# Patient Record
Sex: Male | Born: 1980
Health system: Southern US, Community
[De-identification: ages and names within clinical notes are randomized; demographics above are authoritative.]

## PROBLEM LIST (undated history)

## (undated) ENCOUNTER — Emergency Department (HOSPITAL_COMMUNITY): Admission: EM | Discharge status: Against Medical Advice | Payer: Managed Care, Other (non HMO)

## (undated) DIAGNOSIS — IMO0002 Reserved for concepts with insufficient information to code with codable children: Secondary | ICD-10-CM

## (undated) DIAGNOSIS — Z992 Dependence on renal dialysis: Secondary | ICD-10-CM

## (undated) DIAGNOSIS — I10 Essential (primary) hypertension: Secondary | ICD-10-CM

## (undated) DIAGNOSIS — M329 Systemic lupus erythematosus, unspecified: Secondary | ICD-10-CM

## (undated) DIAGNOSIS — R569 Unspecified convulsions: Secondary | ICD-10-CM

## (undated) DIAGNOSIS — N186 End stage renal disease: Secondary | ICD-10-CM

## (undated) DIAGNOSIS — I639 Cerebral infarction, unspecified: Secondary | ICD-10-CM

## (undated) DIAGNOSIS — Z973 Presence of spectacles and contact lenses: Secondary | ICD-10-CM

## (undated) DIAGNOSIS — N189 Chronic kidney disease, unspecified: Secondary | ICD-10-CM

## (undated) HISTORY — PX: RENAL BIOPSY: SHX156

## (undated) HISTORY — DX: End stage renal disease: N18.6

---

## 2002-06-14 ENCOUNTER — Inpatient Hospital Stay (HOSPITAL_COMMUNITY): Admission: EM | Admit: 2002-06-14 | Discharge: 2002-06-25 | Payer: Self-pay | Admitting: Emergency Medicine

## 2002-06-14 ENCOUNTER — Encounter: Payer: Self-pay | Admitting: Emergency Medicine

## 2002-06-15 ENCOUNTER — Encounter: Payer: Self-pay | Admitting: Neurology

## 2002-06-16 ENCOUNTER — Encounter (INDEPENDENT_AMBULATORY_CARE_PROVIDER_SITE_OTHER): Payer: Self-pay | Admitting: Cardiology

## 2002-06-18 ENCOUNTER — Encounter: Payer: Self-pay | Admitting: Neurology

## 2004-04-25 ENCOUNTER — Emergency Department (HOSPITAL_COMMUNITY): Admission: EM | Admit: 2004-04-25 | Discharge: 2004-04-26 | Payer: Self-pay | Admitting: Emergency Medicine

## 2004-04-26 ENCOUNTER — Inpatient Hospital Stay (HOSPITAL_COMMUNITY): Admission: EM | Admit: 2004-04-26 | Discharge: 2004-05-02 | Payer: Self-pay | Admitting: Emergency Medicine

## 2004-05-05 ENCOUNTER — Ambulatory Visit (HOSPITAL_COMMUNITY): Admission: RE | Admit: 2004-05-05 | Discharge: 2004-05-05 | Payer: Self-pay | Admitting: Neurology

## 2004-05-09 ENCOUNTER — Ambulatory Visit (HOSPITAL_COMMUNITY): Admission: RE | Admit: 2004-05-09 | Discharge: 2004-05-09 | Payer: Self-pay | Admitting: Neurology

## 2004-06-23 ENCOUNTER — Ambulatory Visit: Payer: Self-pay | Admitting: Family Medicine

## 2004-07-06 ENCOUNTER — Ambulatory Visit: Payer: Self-pay | Admitting: Oncology

## 2005-03-09 ENCOUNTER — Emergency Department (HOSPITAL_COMMUNITY): Admission: EM | Admit: 2005-03-09 | Discharge: 2005-03-10 | Payer: Self-pay | Admitting: Emergency Medicine

## 2005-06-18 ENCOUNTER — Emergency Department (HOSPITAL_COMMUNITY): Admission: EM | Admit: 2005-06-18 | Discharge: 2005-06-18 | Payer: Self-pay | Admitting: Emergency Medicine

## 2005-12-23 ENCOUNTER — Emergency Department (HOSPITAL_COMMUNITY): Admission: EM | Admit: 2005-12-23 | Discharge: 2005-12-23 | Payer: Self-pay | Admitting: Family Medicine

## 2006-08-15 DIAGNOSIS — D696 Thrombocytopenia, unspecified: Secondary | ICD-10-CM | POA: Insufficient documentation

## 2006-08-15 DIAGNOSIS — F17200 Nicotine dependence, unspecified, uncomplicated: Secondary | ICD-10-CM | POA: Insufficient documentation

## 2006-08-15 DIAGNOSIS — E669 Obesity, unspecified: Secondary | ICD-10-CM | POA: Insufficient documentation

## 2006-08-15 DIAGNOSIS — I6789 Other cerebrovascular disease: Secondary | ICD-10-CM | POA: Insufficient documentation

## 2007-04-04 ENCOUNTER — Emergency Department (HOSPITAL_COMMUNITY): Admission: EM | Admit: 2007-04-04 | Discharge: 2007-04-04 | Payer: Self-pay | Admitting: Emergency Medicine

## 2007-09-05 ENCOUNTER — Emergency Department (HOSPITAL_COMMUNITY): Admission: EM | Admit: 2007-09-05 | Discharge: 2007-09-05 | Payer: Self-pay | Admitting: Emergency Medicine

## 2008-09-21 ENCOUNTER — Emergency Department (HOSPITAL_COMMUNITY): Admission: EM | Admit: 2008-09-21 | Discharge: 2008-09-21 | Payer: Self-pay | Admitting: Emergency Medicine

## 2008-10-07 ENCOUNTER — Emergency Department (HOSPITAL_COMMUNITY): Admission: EM | Admit: 2008-10-07 | Discharge: 2008-10-07 | Payer: Self-pay | Admitting: Emergency Medicine

## 2008-10-07 ENCOUNTER — Encounter (INDEPENDENT_AMBULATORY_CARE_PROVIDER_SITE_OTHER): Payer: Self-pay | Admitting: Emergency Medicine

## 2008-10-07 ENCOUNTER — Ambulatory Visit: Payer: Self-pay | Admitting: Surgery

## 2008-10-10 ENCOUNTER — Emergency Department (HOSPITAL_COMMUNITY): Admission: EM | Admit: 2008-10-10 | Discharge: 2008-10-10 | Payer: Self-pay | Admitting: Family Medicine

## 2008-12-18 ENCOUNTER — Emergency Department (HOSPITAL_COMMUNITY): Admission: EM | Admit: 2008-12-18 | Discharge: 2008-12-18 | Payer: Self-pay | Admitting: Emergency Medicine

## 2008-12-18 ENCOUNTER — Ambulatory Visit: Payer: Self-pay | Admitting: Vascular Surgery

## 2009-02-15 ENCOUNTER — Inpatient Hospital Stay (HOSPITAL_COMMUNITY): Admission: EM | Admit: 2009-02-15 | Discharge: 2009-02-25 | Payer: Self-pay | Admitting: Emergency Medicine

## 2009-02-15 ENCOUNTER — Ambulatory Visit: Payer: Self-pay | Admitting: Internal Medicine

## 2009-02-22 ENCOUNTER — Encounter (INDEPENDENT_AMBULATORY_CARE_PROVIDER_SITE_OTHER): Payer: Self-pay | Admitting: Interventional Radiology

## 2009-02-22 ENCOUNTER — Encounter (INDEPENDENT_AMBULATORY_CARE_PROVIDER_SITE_OTHER): Payer: Self-pay | Admitting: Internal Medicine

## 2009-02-22 ENCOUNTER — Ambulatory Visit: Payer: Self-pay | Admitting: Oncology

## 2009-03-17 ENCOUNTER — Encounter: Admission: RE | Admit: 2009-03-17 | Discharge: 2009-03-17 | Payer: Self-pay | Admitting: Family Medicine

## 2009-03-24 ENCOUNTER — Ambulatory Visit: Payer: Self-pay | Admitting: Oncology

## 2009-03-24 LAB — CBC & DIFF AND RETIC
Basophils Absolute: 0 10*3/uL (ref 0.0–0.1)
Eosinophils Absolute: 0 10*3/uL (ref 0.0–0.5)
HGB: 10.7 g/dL — ABNORMAL LOW (ref 13.0–17.1)
Immature Retic Fract: 2.5 % (ref 0.00–13.40)
LYMPH%: 7.1 % — ABNORMAL LOW (ref 14.0–49.0)
MCV: 88.1 fL (ref 79.3–98.0)
MONO#: 0.4 10*3/uL (ref 0.1–0.9)
MONO%: 5 % (ref 0.0–14.0)
NEUT#: 6.8 10*3/uL — ABNORMAL HIGH (ref 1.5–6.5)
Platelets: 152 10*3/uL (ref 140–400)
RDW: 15.9 % — ABNORMAL HIGH (ref 11.0–14.6)
Retic %: 0.63 % (ref 0.50–1.60)
WBC: 7.8 10*3/uL (ref 4.0–10.3)
nRBC: 0 % (ref 0–0)

## 2009-03-24 LAB — COMPREHENSIVE METABOLIC PANEL
ALT: 24 U/L (ref 0–53)
Albumin: 2.1 g/dL — ABNORMAL LOW (ref 3.5–5.2)
Alkaline Phosphatase: 89 U/L (ref 39–117)
Glucose, Bld: 104 mg/dL — ABNORMAL HIGH (ref 70–99)
Potassium: 4.1 mEq/L (ref 3.5–5.3)
Sodium: 134 mEq/L — ABNORMAL LOW (ref 135–145)
Total Bilirubin: 0.5 mg/dL (ref 0.3–1.2)
Total Protein: 6.1 g/dL (ref 6.0–8.3)

## 2009-03-24 LAB — PROTIME-INR: Protime: 13.2 Seconds (ref 10.6–13.4)

## 2009-03-24 LAB — MORPHOLOGY

## 2009-03-28 LAB — SPEP & IFE WITH QIG
Alpha-1-Globulin: 10 % — ABNORMAL HIGH (ref 2.9–4.9)
Gamma Globulin: 13.7 % (ref 11.1–18.8)
IgG (Immunoglobin G), Serum: 934 mg/dL (ref 694–1618)
IgM, Serum: 26 mg/dL — ABNORMAL LOW (ref 60–263)

## 2009-04-01 LAB — PROTIME-INR: Protime: 15.6 Seconds — ABNORMAL HIGH (ref 10.6–13.4)

## 2009-04-13 LAB — PROTIME-INR: INR: 3 (ref 2.00–3.50)

## 2009-04-20 LAB — PROTIME-INR
INR: 1.4 — ABNORMAL LOW (ref 2.00–3.50)
Protime: 16.8 Seconds — ABNORMAL HIGH (ref 10.6–13.4)

## 2009-04-25 ENCOUNTER — Ambulatory Visit: Payer: Self-pay | Admitting: Oncology

## 2009-04-27 LAB — PROTIME-INR
INR: 1.7 — ABNORMAL LOW (ref 2.00–3.50)
Protime: 20.4 Seconds — ABNORMAL HIGH (ref 10.6–13.4)

## 2009-05-11 LAB — PROTIME-INR: INR: 1.7 — ABNORMAL LOW (ref 2.00–3.50)

## 2009-05-18 LAB — PROTIME-INR: Protime: 14.4 Seconds — ABNORMAL HIGH (ref 10.6–13.4)

## 2009-05-23 ENCOUNTER — Ambulatory Visit: Payer: Self-pay | Admitting: Oncology

## 2009-05-25 LAB — PROTIME-INR: INR: 1.1 — ABNORMAL LOW (ref 2.00–3.50)

## 2009-06-03 LAB — PROTIME-INR
INR: 1.9 — ABNORMAL LOW (ref 2.00–3.50)
Protime: 22.8 Seconds — ABNORMAL HIGH (ref 10.6–13.4)

## 2009-06-16 ENCOUNTER — Ambulatory Visit: Payer: Self-pay | Admitting: Oncology

## 2009-06-22 LAB — PROTIME-INR: INR: 1.8 — ABNORMAL LOW (ref 2.00–3.50)

## 2009-06-29 LAB — PROTIME-INR
INR: 3.9 — ABNORMAL HIGH (ref 2.00–3.50)
Protime: 46.8 Seconds — ABNORMAL HIGH (ref 10.6–13.4)

## 2009-07-06 LAB — PROTIME-INR
INR: 3.1 (ref 2.00–3.50)
Protime: 37.2 Seconds — ABNORMAL HIGH (ref 10.6–13.4)

## 2009-07-13 LAB — PROTIME-INR
INR: 2 (ref 2.00–3.50)
Protime: 24 Seconds — ABNORMAL HIGH (ref 10.6–13.4)

## 2009-07-18 ENCOUNTER — Ambulatory Visit: Payer: Self-pay | Admitting: Oncology

## 2009-07-20 LAB — PROTIME-INR: Protime: 22.8 Seconds — ABNORMAL HIGH (ref 10.6–13.4)

## 2009-08-03 LAB — COMPREHENSIVE METABOLIC PANEL
Albumin: 3.7 g/dL (ref 3.5–5.2)
BUN: 27 mg/dL — ABNORMAL HIGH (ref 6–23)
Calcium: 8.6 mg/dL (ref 8.4–10.5)
Chloride: 101 mEq/L (ref 96–112)
Glucose, Bld: 80 mg/dL (ref 70–99)
Potassium: 3.9 mEq/L (ref 3.5–5.3)

## 2009-08-03 LAB — CBC WITH DIFFERENTIAL/PLATELET
Basophils Absolute: 0 10*3/uL (ref 0.0–0.1)
Eosinophils Absolute: 0.1 10*3/uL (ref 0.0–0.5)
LYMPH%: 16.5 % (ref 14.0–49.0)
MCV: 87.9 fL (ref 79.3–98.0)
MONO%: 10.1 % (ref 0.0–14.0)
NEUT#: 5 10*3/uL (ref 1.5–6.5)
Platelets: 112 10*3/uL — ABNORMAL LOW (ref 140–400)
RBC: 4.66 10*6/uL (ref 4.20–5.82)

## 2009-08-03 LAB — PROTIME-INR: Protime: 19.2 Seconds — ABNORMAL HIGH (ref 10.6–13.4)

## 2009-08-03 LAB — LACTATE DEHYDROGENASE: LDH: 220 U/L (ref 94–250)

## 2009-08-03 LAB — SEDIMENTATION RATE: Sed Rate: 15 mm/hr (ref 0–16)

## 2009-08-08 LAB — CBC WITH DIFFERENTIAL/PLATELET
BASO%: 0.1 % (ref 0.0–2.0)
LYMPH%: 10.2 % — ABNORMAL LOW (ref 14.0–49.0)
MCHC: 34 g/dL (ref 32.0–36.0)
MCV: 85.1 fL (ref 79.3–98.0)
MONO%: 6.9 % (ref 0.0–14.0)
Platelets: 140 10*3/uL (ref 140–400)
RBC: 4.91 10*6/uL (ref 4.20–5.82)

## 2009-08-08 LAB — PROTIME-INR: Protime: 21.6 Seconds — ABNORMAL HIGH (ref 10.6–13.4)

## 2009-08-12 ENCOUNTER — Ambulatory Visit: Payer: Self-pay | Admitting: Oncology

## 2009-08-17 LAB — PROTIME-INR: Protime: 54 Seconds — ABNORMAL HIGH (ref 10.6–13.4)

## 2009-08-24 LAB — PROTIME-INR
INR: 3.6 — ABNORMAL HIGH (ref 2.00–3.50)
Protime: 43.2 Seconds — ABNORMAL HIGH (ref 10.6–13.4)

## 2009-08-31 LAB — PROTIME-INR: INR: 2.3 (ref 2.00–3.50)

## 2009-09-05 ENCOUNTER — Ambulatory Visit: Payer: Self-pay | Admitting: Oncology

## 2009-09-07 LAB — PROTIME-INR

## 2009-09-14 LAB — PROTIME-INR: Protime: 25.2 Seconds — ABNORMAL HIGH (ref 10.6–13.4)

## 2009-09-28 LAB — PROTIME-INR: INR: 1.6 — ABNORMAL LOW (ref 2.00–3.50)

## 2009-10-05 ENCOUNTER — Ambulatory Visit: Payer: Self-pay | Admitting: Oncology

## 2009-10-05 LAB — PROTIME-INR: Protime: 22.8 Seconds — ABNORMAL HIGH (ref 10.6–13.4)

## 2009-10-12 LAB — PROTIME-INR: Protime: 19.2 Seconds — ABNORMAL HIGH (ref 10.6–13.4)

## 2009-10-19 LAB — PROTIME-INR
INR: 2.8 (ref 2.00–3.50)
Protime: 33.6 Seconds — ABNORMAL HIGH (ref 10.6–13.4)

## 2009-10-26 LAB — PROTIME-INR
INR: 3.2 (ref 2.00–3.50)
Protime: 38.4 Seconds — ABNORMAL HIGH (ref 10.6–13.4)

## 2009-11-02 LAB — PROTIME-INR
INR: 1.2 — ABNORMAL LOW (ref 2.00–3.50)
Protime: 14.4 Seconds — ABNORMAL HIGH (ref 10.6–13.4)

## 2009-11-08 ENCOUNTER — Ambulatory Visit: Payer: Self-pay | Admitting: Oncology

## 2009-11-09 LAB — PROTIME-INR
INR: 1.5 — ABNORMAL LOW (ref 2.00–3.50)
Protime: 18 Seconds — ABNORMAL HIGH (ref 10.6–13.4)

## 2009-11-16 LAB — PROTIME-INR: Protime: 20.4 Seconds — ABNORMAL HIGH (ref 10.6–13.4)

## 2009-11-23 LAB — PROTIME-INR
INR: 3.7 — ABNORMAL HIGH (ref 2.00–3.50)
Protime: 44.4 Seconds — ABNORMAL HIGH (ref 10.6–13.4)

## 2009-11-30 LAB — PROTIME-INR
INR: 3 (ref 2.00–3.50)
Protime: 36 Seconds — ABNORMAL HIGH (ref 10.6–13.4)

## 2009-12-07 LAB — PROTIME-INR: INR: 4.8 — ABNORMAL HIGH (ref 2.00–3.50)

## 2009-12-12 ENCOUNTER — Ambulatory Visit: Payer: Self-pay | Admitting: Oncology

## 2009-12-14 LAB — PROTIME-INR
INR: 2 (ref 2.00–3.50)
Protime: 24 Seconds — ABNORMAL HIGH (ref 10.6–13.4)

## 2009-12-21 LAB — PROTIME-INR
INR: 2.4 (ref 2.00–3.50)
Protime: 28.8 Seconds — ABNORMAL HIGH (ref 10.6–13.4)

## 2009-12-28 LAB — PROTIME-INR
INR: 2.7 (ref 2.00–3.50)
Protime: 32.4 Seconds — ABNORMAL HIGH (ref 10.6–13.4)

## 2010-01-04 LAB — PROTIME-INR
INR: 2.4 (ref 2.00–3.50)
Protime: 28.8 Seconds — ABNORMAL HIGH (ref 10.6–13.4)

## 2010-01-11 ENCOUNTER — Ambulatory Visit: Payer: Self-pay | Admitting: Oncology

## 2010-01-11 LAB — PROTIME-INR: Protime: 25.2 Seconds — ABNORMAL HIGH (ref 10.6–13.4)

## 2010-01-18 LAB — PROTIME-INR
INR: 2.2 (ref 2.00–3.50)
Protime: 26.4 Seconds — ABNORMAL HIGH (ref 10.6–13.4)

## 2010-01-25 LAB — PROTIME-INR
INR: 1.6 — ABNORMAL LOW (ref 2.00–3.50)
Protime: 19.2 Seconds — ABNORMAL HIGH (ref 10.6–13.4)

## 2010-02-01 LAB — PROTIME-INR
INR: 2.2 (ref 2.00–3.50)
Protime: 26.4 Seconds — ABNORMAL HIGH (ref 10.6–13.4)

## 2010-02-08 LAB — PROTIME-INR
INR: 2 (ref 2.00–3.50)
Protime: 24 Seconds — ABNORMAL HIGH (ref 10.6–13.4)

## 2010-02-13 ENCOUNTER — Ambulatory Visit: Payer: Self-pay | Admitting: Oncology

## 2010-02-15 LAB — PROTIME-INR
INR: 1.6 — ABNORMAL LOW (ref 2.00–3.50)
Protime: 19.2 s — ABNORMAL HIGH (ref 10.6–13.4)

## 2010-02-22 LAB — PROTIME-INR: INR: 1.7 — ABNORMAL LOW (ref 2.00–3.50)

## 2010-03-03 LAB — COMPREHENSIVE METABOLIC PANEL
ALT: 11 U/L (ref 0–53)
AST: 14 U/L (ref 0–37)
Albumin: 4.1 g/dL (ref 3.5–5.2)
Alkaline Phosphatase: 95 U/L (ref 39–117)
CO2: 27 mEq/L (ref 19–32)
Calcium: 9.1 mg/dL (ref 8.4–10.5)
Chloride: 104 mEq/L (ref 96–112)
Creatinine, Ser: 1.61 mg/dL — ABNORMAL HIGH (ref 0.40–1.50)
Glucose, Bld: 92 mg/dL (ref 70–99)
Potassium: 3.9 mEq/L (ref 3.5–5.3)
Sodium: 141 mEq/L (ref 135–145)
Total Bilirubin: 0.3 mg/dL (ref 0.3–1.2)
Total Protein: 6.7 g/dL (ref 6.0–8.3)

## 2010-03-03 LAB — CBC WITH DIFFERENTIAL/PLATELET
Basophils Absolute: 0 10*3/uL (ref 0.0–0.1)
Eosinophils Absolute: 0.1 10*3/uL (ref 0.0–0.5)
HCT: 41.5 % (ref 38.4–49.9)
HGB: 13.7 g/dL (ref 13.0–17.1)
LYMPH%: 22.4 % (ref 14.0–49.0)
MONO#: 0.6 10*3/uL (ref 0.1–0.9)
MONO%: 10.2 % (ref 0.0–14.0)
NEUT#: 4.1 10*3/uL (ref 1.5–6.5)
NEUT%: 65.3 % (ref 39.0–75.0)
Platelets: 173 10*3/uL (ref 140–400)
RBC: 4.78 10*6/uL (ref 4.20–5.82)
RDW: 14.7 % — ABNORMAL HIGH (ref 11.0–14.6)
WBC: 6.3 10*3/uL (ref 4.0–10.3)
lymph#: 1.4 10*3/uL (ref 0.9–3.3)

## 2010-03-03 LAB — PROTIME-INR
INR: 4.1 — ABNORMAL HIGH (ref 2.00–3.50)
Protime: 49.2 Seconds — ABNORMAL HIGH (ref 10.6–13.4)

## 2010-03-03 LAB — LACTATE DEHYDROGENASE: LDH: 181 U/L (ref 94–250)

## 2010-03-08 LAB — CBC WITH DIFFERENTIAL/PLATELET
BASO%: 0.2 % (ref 0.0–2.0)
EOS%: 1 % (ref 0.0–7.0)
Eosinophils Absolute: 0.1 10*3/uL (ref 0.0–0.5)
HCT: 42 % (ref 38.4–49.9)
HGB: 14.1 g/dL (ref 13.0–17.1)
LYMPH%: 7.1 % — ABNORMAL LOW (ref 14.0–49.0)
MCH: 29.2 pg (ref 27.2–33.4)
MCHC: 33.6 g/dL (ref 32.0–36.0)
MCV: 87 fL (ref 79.3–98.0)
MONO#: 0.7 10*3/uL (ref 0.1–0.9)
MONO%: 7 % (ref 0.0–14.0)
NEUT%: 84.7 % — ABNORMAL HIGH (ref 39.0–75.0)
Platelets: 153 10*3/uL (ref 140–400)
RBC: 4.83 10*6/uL (ref 4.20–5.82)
RDW: 14.2 % (ref 11.0–14.6)
WBC: 10.5 10*3/uL — ABNORMAL HIGH (ref 4.0–10.3)

## 2010-03-08 LAB — PROTIME-INR
INR: 1.8 — ABNORMAL LOW (ref 2.00–3.50)
Protime: 21.6 Seconds — ABNORMAL HIGH (ref 10.6–13.4)

## 2010-03-15 ENCOUNTER — Ambulatory Visit: Payer: Self-pay | Admitting: Oncology

## 2010-03-15 LAB — PROTIME-INR
INR: 2 (ref 2.00–3.50)
Protime: 24 Seconds — ABNORMAL HIGH (ref 10.6–13.4)

## 2010-03-29 LAB — PROTIME-INR
INR: 1.7 — ABNORMAL LOW (ref 2.00–3.50)
Protime: 20.4 Seconds — ABNORMAL HIGH (ref 10.6–13.4)

## 2010-04-12 LAB — PROTIME-INR
INR: 3 (ref 2.00–3.50)
Protime: 36 Seconds — ABNORMAL HIGH (ref 10.6–13.4)

## 2010-04-17 ENCOUNTER — Ambulatory Visit: Payer: Self-pay | Admitting: Oncology

## 2010-04-19 LAB — PROTIME-INR: INR: 3.1 (ref 2.00–3.50)

## 2010-04-26 LAB — PROTIME-INR
INR: 2.1 (ref 2.00–3.50)
Protime: 25.2 Seconds — ABNORMAL HIGH (ref 10.6–13.4)

## 2010-05-17 ENCOUNTER — Ambulatory Visit: Payer: Self-pay | Admitting: Oncology

## 2010-05-17 LAB — PROTIME-INR: Protime: 20.4 Seconds — ABNORMAL HIGH (ref 10.6–13.4)

## 2010-05-24 LAB — PROTIME-INR: Protime: 30 Seconds — ABNORMAL HIGH (ref 10.6–13.4)

## 2010-06-09 ENCOUNTER — Emergency Department (HOSPITAL_COMMUNITY)
Admission: EM | Admit: 2010-06-09 | Discharge: 2010-06-09 | Payer: Self-pay | Source: Home / Self Care | Admitting: Emergency Medicine

## 2010-06-16 ENCOUNTER — Emergency Department (HOSPITAL_COMMUNITY)
Admission: EM | Admit: 2010-06-16 | Discharge: 2010-06-16 | Payer: Self-pay | Source: Home / Self Care | Admitting: Emergency Medicine

## 2010-06-21 ENCOUNTER — Ambulatory Visit: Payer: Self-pay | Admitting: Oncology

## 2010-06-21 LAB — PROTIME-INR
INR: 1.3 — ABNORMAL LOW (ref 2.00–3.50)
Protime: 15.6 Seconds — ABNORMAL HIGH (ref 10.6–13.4)

## 2010-07-05 LAB — PROTHROMBIN TIME
INR: 5.45 (ref <1.50)
Prothrombin Time: 49.4 seconds — ABNORMAL HIGH (ref 11.6–15.2)

## 2010-07-05 LAB — PROTIME-INR

## 2010-07-12 LAB — PROTIME-INR
INR: 2.7 (ref 2.00–3.50)
Protime: 32.4 Seconds — ABNORMAL HIGH (ref 10.6–13.4)

## 2010-07-26 ENCOUNTER — Other Ambulatory Visit: Payer: Self-pay | Admitting: Oncology

## 2010-07-26 ENCOUNTER — Encounter (HOSPITAL_BASED_OUTPATIENT_CLINIC_OR_DEPARTMENT_OTHER): Payer: BC Managed Care – PPO | Admitting: Oncology

## 2010-07-26 DIAGNOSIS — Z7901 Long term (current) use of anticoagulants: Secondary | ICD-10-CM

## 2010-07-26 DIAGNOSIS — D696 Thrombocytopenia, unspecified: Secondary | ICD-10-CM

## 2010-07-26 DIAGNOSIS — Z8673 Personal history of transient ischemic attack (TIA), and cerebral infarction without residual deficits: Secondary | ICD-10-CM

## 2010-07-26 LAB — PROTIME-INR

## 2010-07-26 LAB — PROTHROMBIN TIME: INR: 7.43 (ref <1.50)

## 2010-07-31 ENCOUNTER — Other Ambulatory Visit: Payer: Self-pay | Admitting: Oncology

## 2010-07-31 ENCOUNTER — Encounter (HOSPITAL_BASED_OUTPATIENT_CLINIC_OR_DEPARTMENT_OTHER): Payer: BC Managed Care – PPO | Admitting: Oncology

## 2010-07-31 DIAGNOSIS — Z8673 Personal history of transient ischemic attack (TIA), and cerebral infarction without residual deficits: Secondary | ICD-10-CM

## 2010-07-31 DIAGNOSIS — Z7901 Long term (current) use of anticoagulants: Secondary | ICD-10-CM

## 2010-07-31 DIAGNOSIS — D696 Thrombocytopenia, unspecified: Secondary | ICD-10-CM

## 2010-07-31 LAB — CBC WITH DIFFERENTIAL/PLATELET
BASO%: 0.4 % (ref 0.0–2.0)
EOS%: 2 % (ref 0.0–7.0)
Eosinophils Absolute: 0.1 10*3/uL (ref 0.0–0.5)
HGB: 14.8 g/dL (ref 13.0–17.1)
LYMPH%: 19.3 % (ref 14.0–49.0)
MCH: 28.8 pg (ref 27.2–33.4)
MCHC: 33.3 g/dL (ref 32.0–36.0)
MCV: 86.6 fL (ref 79.3–98.0)
MONO#: 0.7 10*3/uL (ref 0.1–0.9)
MONO%: 14.2 % — ABNORMAL HIGH (ref 0.0–14.0)
NEUT%: 64.1 % (ref 39.0–75.0)
Platelets: 163 10*3/uL (ref 140–400)
RBC: 5.14 10*6/uL (ref 4.20–5.82)
WBC: 4.9 10*3/uL (ref 4.0–10.3)
lymph#: 1 10*3/uL (ref 0.9–3.3)
nRBC: 0 % (ref 0–0)

## 2010-07-31 LAB — PROTIME-INR: INR: 2.1 (ref 2.00–3.50)

## 2010-08-09 ENCOUNTER — Encounter (HOSPITAL_BASED_OUTPATIENT_CLINIC_OR_DEPARTMENT_OTHER): Payer: BC Managed Care – PPO | Admitting: Oncology

## 2010-08-09 ENCOUNTER — Other Ambulatory Visit: Payer: Self-pay | Admitting: Oncology

## 2010-08-09 DIAGNOSIS — Z8673 Personal history of transient ischemic attack (TIA), and cerebral infarction without residual deficits: Secondary | ICD-10-CM

## 2010-08-09 DIAGNOSIS — D696 Thrombocytopenia, unspecified: Secondary | ICD-10-CM

## 2010-08-09 DIAGNOSIS — Z7901 Long term (current) use of anticoagulants: Secondary | ICD-10-CM

## 2010-08-09 LAB — PROTIME-INR: Protime: 19.2 Seconds — ABNORMAL HIGH (ref 10.6–13.4)

## 2010-08-16 ENCOUNTER — Encounter (HOSPITAL_BASED_OUTPATIENT_CLINIC_OR_DEPARTMENT_OTHER): Payer: BC Managed Care – PPO | Admitting: Oncology

## 2010-08-16 ENCOUNTER — Other Ambulatory Visit: Payer: Self-pay | Admitting: Oncology

## 2010-08-16 DIAGNOSIS — Z8673 Personal history of transient ischemic attack (TIA), and cerebral infarction without residual deficits: Secondary | ICD-10-CM

## 2010-08-16 DIAGNOSIS — D696 Thrombocytopenia, unspecified: Secondary | ICD-10-CM

## 2010-08-16 DIAGNOSIS — Z7901 Long term (current) use of anticoagulants: Secondary | ICD-10-CM

## 2010-08-16 LAB — PROTIME-INR: INR: 1.7 — ABNORMAL LOW (ref 2.00–3.50)

## 2010-08-28 LAB — DIFFERENTIAL
Basophils Absolute: 0 10*3/uL (ref 0.0–0.1)
Eosinophils Absolute: 0.2 10*3/uL (ref 0.0–0.7)
Eosinophils Relative: 2 % (ref 0–5)
Eosinophils Relative: 2 % (ref 0–5)
Lymphocytes Relative: 23 % (ref 12–46)
Lymphs Abs: 1.5 10*3/uL (ref 0.7–4.0)
Monocytes Absolute: 0.6 10*3/uL (ref 0.1–1.0)
Monocytes Absolute: 0.6 10*3/uL (ref 0.1–1.0)
Monocytes Relative: 10 % (ref 3–12)

## 2010-08-28 LAB — COMPREHENSIVE METABOLIC PANEL
ALT: 19 U/L (ref 0–53)
AST: 29 U/L (ref 0–37)
Albumin: 3.9 g/dL (ref 3.5–5.2)
CO2: 25 mEq/L (ref 19–32)
Chloride: 101 mEq/L (ref 96–112)
GFR calc Af Amer: 33 mL/min — ABNORMAL LOW (ref >60)
GFR calc non Af Amer: 27 mL/min — ABNORMAL LOW (ref >60)
Potassium: 3.2 mEq/L — ABNORMAL LOW (ref 3.5–5.1)
Sodium: 139 mEq/L (ref 135–145)
Total Bilirubin: 0.5 mg/dL (ref 0.3–1.2)

## 2010-08-28 LAB — CBC
HCT: 47.2 % (ref 39.0–52.0)
Hemoglobin: 15.9 g/dL (ref 13.0–17.0)
Hemoglobin: 16.2 g/dL (ref 13.0–17.0)
MCH: 29.6 pg (ref 26.0–34.0)
MCV: 87.9 fL (ref 78.0–100.0)
RBC: 5.37 MIL/uL (ref 4.22–5.81)
RBC: 5.53 MIL/uL (ref 4.22–5.81)
WBC: 10.3 10*3/uL (ref 4.0–10.5)
WBC: 6.2 10*3/uL (ref 4.0–10.5)

## 2010-08-28 LAB — URINALYSIS, ROUTINE W REFLEX MICROSCOPIC
Bilirubin Urine: NEGATIVE
Glucose, UA: NEGATIVE mg/dL
Ketones, ur: NEGATIVE mg/dL
Nitrite: NEGATIVE
Protein, ur: 30 mg/dL — AB
pH: 5.5 (ref 5.0–8.0)

## 2010-08-28 LAB — PROTIME-INR
INR: 3 — ABNORMAL HIGH (ref 0.00–1.49)
Prothrombin Time: 57.1 seconds — ABNORMAL HIGH (ref 11.6–15.2)

## 2010-08-28 LAB — POCT CARDIAC MARKERS
CKMB, poc: 1.6 ng/mL (ref 1.0–8.0)
Myoglobin, poc: >500 ng/mL (ref 12–200)
Troponin i, poc: <0.05 ng/mL (ref 0.00–0.09)

## 2010-08-28 LAB — URINE CULTURE

## 2010-08-28 LAB — BASIC METABOLIC PANEL
CO2: 28 mEq/L (ref 19–32)
Chloride: 105 mEq/L (ref 96–112)
GFR calc Af Amer: 49 mL/min — ABNORMAL LOW (ref >60)
Potassium: 4.4 mEq/L (ref 3.5–5.1)

## 2010-08-28 LAB — PHENYTOIN LEVEL, TOTAL: Phenytoin Lvl: <2.5 ug/mL — ABNORMAL LOW (ref 10.0–20.0)

## 2010-08-28 LAB — URINE MICROSCOPIC-ADD ON

## 2010-09-13 ENCOUNTER — Other Ambulatory Visit: Payer: Self-pay | Admitting: Oncology

## 2010-09-13 ENCOUNTER — Encounter (HOSPITAL_BASED_OUTPATIENT_CLINIC_OR_DEPARTMENT_OTHER): Payer: BC Managed Care – PPO | Admitting: Oncology

## 2010-09-13 DIAGNOSIS — D696 Thrombocytopenia, unspecified: Secondary | ICD-10-CM

## 2010-09-13 DIAGNOSIS — Z7901 Long term (current) use of anticoagulants: Secondary | ICD-10-CM

## 2010-09-13 DIAGNOSIS — Z8673 Personal history of transient ischemic attack (TIA), and cerebral infarction without residual deficits: Secondary | ICD-10-CM

## 2010-09-13 LAB — PROTIME-INR: INR: 2 (ref 2.00–3.50)

## 2010-09-22 LAB — BASIC METABOLIC PANEL
BUN: 31 mg/dL — ABNORMAL HIGH (ref 6–23)
BUN: 41 mg/dL — ABNORMAL HIGH (ref 6–23)
CO2: 24 mEq/L (ref 19–32)
CO2: 26 mEq/L (ref 19–32)
CO2: 28 mEq/L (ref 19–32)
Calcium: 8.2 mg/dL — ABNORMAL LOW (ref 8.4–10.5)
Chloride: 107 mEq/L (ref 96–112)
Chloride: 109 mEq/L (ref 96–112)
Creatinine, Ser: 2.47 mg/dL — ABNORMAL HIGH (ref 0.4–1.5)
Creatinine, Ser: 2.58 mg/dL — ABNORMAL HIGH (ref 0.4–1.5)
GFR calc Af Amer: 38 mL/min — ABNORMAL LOW (ref >60)
Glucose, Bld: 126 mg/dL — ABNORMAL HIGH (ref 70–99)
Glucose, Bld: 136 mg/dL — ABNORMAL HIGH (ref 70–99)
Potassium: 4.9 mEq/L (ref 3.5–5.1)
Sodium: 139 mEq/L (ref 135–145)

## 2010-09-22 LAB — RENAL FUNCTION PANEL
Albumin: 2 g/dL — ABNORMAL LOW (ref 3.5–5.2)
Albumin: 2.1 g/dL — ABNORMAL LOW (ref 3.5–5.2)
BUN: 21 mg/dL (ref 6–23)
BUN: 32 mg/dL — ABNORMAL HIGH (ref 6–23)
BUN: 37 mg/dL — ABNORMAL HIGH (ref 6–23)
CO2: 27 mEq/L (ref 19–32)
CO2: 27 mEq/L (ref 19–32)
CO2: 29 mEq/L (ref 19–32)
CO2: 29 mEq/L (ref 19–32)
Calcium: 7.5 mg/dL — ABNORMAL LOW (ref 8.4–10.5)
Calcium: 7.5 mg/dL — ABNORMAL LOW (ref 8.4–10.5)
Calcium: 7.8 mg/dL — ABNORMAL LOW (ref 8.4–10.5)
Calcium: 8 mg/dL — ABNORMAL LOW (ref 8.4–10.5)
Calcium: 8.2 mg/dL — ABNORMAL LOW (ref 8.4–10.5)
Chloride: 108 mEq/L (ref 96–112)
Creatinine, Ser: 2.05 mg/dL — ABNORMAL HIGH (ref 0.4–1.5)
Creatinine, Ser: 2.27 mg/dL — ABNORMAL HIGH (ref 0.4–1.5)
Creatinine, Ser: 2.34 mg/dL — ABNORMAL HIGH (ref 0.4–1.5)
Creatinine, Ser: 2.47 mg/dL — ABNORMAL HIGH (ref 0.4–1.5)
Creatinine, Ser: 2.49 mg/dL — ABNORMAL HIGH (ref 0.4–1.5)
Creatinine, Ser: 2.68 mg/dL — ABNORMAL HIGH (ref 0.4–1.5)
GFR calc Af Amer: 31 mL/min — ABNORMAL LOW (ref >60)
GFR calc Af Amer: 34 mL/min — ABNORMAL LOW (ref >60)
GFR calc Af Amer: 42 mL/min — ABNORMAL LOW (ref >60)
GFR calc Af Amer: 47 mL/min — ABNORMAL LOW (ref >60)
GFR calc non Af Amer: 25 mL/min — ABNORMAL LOW (ref >60)
GFR calc non Af Amer: 28 mL/min — ABNORMAL LOW (ref >60)
GFR calc non Af Amer: 34 mL/min — ABNORMAL LOW (ref >60)
GFR calc non Af Amer: 39 mL/min — ABNORMAL LOW (ref >60)
Glucose, Bld: 102 mg/dL — ABNORMAL HIGH (ref 70–99)
Glucose, Bld: 112 mg/dL — ABNORMAL HIGH (ref 70–99)
Glucose, Bld: 125 mg/dL — ABNORMAL HIGH (ref 70–99)
Glucose, Bld: 136 mg/dL — ABNORMAL HIGH (ref 70–99)
Phosphorus: 2.7 mg/dL (ref 2.3–4.6)
Phosphorus: 3.9 mg/dL (ref 2.3–4.6)
Phosphorus: 4 mg/dL (ref 2.3–4.6)
Phosphorus: 4.9 mg/dL — ABNORMAL HIGH (ref 2.3–4.6)
Potassium: 4.2 mEq/L (ref 3.5–5.1)
Potassium: 4.8 mEq/L (ref 3.5–5.1)
Sodium: 141 mEq/L (ref 135–145)
Sodium: 142 mEq/L (ref 135–145)

## 2010-09-22 LAB — CBC
HCT: 31.3 % — ABNORMAL LOW (ref 39.0–52.0)
Hemoglobin: 10.3 g/dL — ABNORMAL LOW (ref 13.0–17.0)
Hemoglobin: 10.4 g/dL — ABNORMAL LOW (ref 13.0–17.0)
Hemoglobin: 10.6 g/dL — ABNORMAL LOW (ref 13.0–17.0)
Hemoglobin: 10.6 g/dL — ABNORMAL LOW (ref 13.0–17.0)
Hemoglobin: 10.6 g/dL — ABNORMAL LOW (ref 13.0–17.0)
Hemoglobin: 11.3 g/dL — ABNORMAL LOW (ref 13.0–17.0)
MCHC: 33.1 g/dL (ref 30.0–36.0)
MCHC: 33.2 g/dL (ref 30.0–36.0)
MCHC: 33.8 g/dL (ref 30.0–36.0)
MCHC: 33.8 g/dL (ref 30.0–36.0)
MCHC: 33.9 g/dL (ref 30.0–36.0)
MCHC: 34.2 g/dL (ref 30.0–36.0)
MCHC: 34.3 g/dL (ref 30.0–36.0)
MCV: 83.7 fL (ref 78.0–100.0)
MCV: 84.8 fL (ref 78.0–100.0)
MCV: 85.1 fL (ref 78.0–100.0)
MCV: 85.1 fL (ref 78.0–100.0)
MCV: 86.1 fL (ref 78.0–100.0)
Platelets: 49 10*3/uL — CL (ref 150–400)
Platelets: 51 10*3/uL — ABNORMAL LOW (ref 150–400)
Platelets: 53 10*3/uL — ABNORMAL LOW (ref 150–400)
Platelets: 59 10*3/uL — ABNORMAL LOW (ref 150–400)
Platelets: 64 10*3/uL — ABNORMAL LOW (ref 150–400)
RBC: 3.2 MIL/uL — ABNORMAL LOW (ref 4.22–5.81)
RBC: 3.31 MIL/uL — ABNORMAL LOW (ref 4.22–5.81)
RBC: 3.73 MIL/uL — ABNORMAL LOW (ref 4.22–5.81)
RBC: 3.76 MIL/uL — ABNORMAL LOW (ref 4.22–5.81)
RBC: 3.78 MIL/uL — ABNORMAL LOW (ref 4.22–5.81)
RBC: 4.02 MIL/uL — ABNORMAL LOW (ref 4.22–5.81)
RDW: 13.5 % (ref 11.5–15.5)
RDW: 13.7 % (ref 11.5–15.5)
RDW: 13.9 % (ref 11.5–15.5)
RDW: 14 % (ref 11.5–15.5)
RDW: 14.1 % (ref 11.5–15.5)
RDW: 14.3 % (ref 11.5–15.5)
WBC: 11.2 10*3/uL — ABNORMAL HIGH (ref 4.0–10.5)
WBC: 13.9 10*3/uL — ABNORMAL HIGH (ref 4.0–10.5)

## 2010-09-22 LAB — PREPARE PLATELETS

## 2010-09-22 LAB — COMPREHENSIVE METABOLIC PANEL
ALT: 35 U/L (ref 0–53)
Albumin: 2.3 g/dL — ABNORMAL LOW (ref 3.5–5.2)
Alkaline Phosphatase: 64 U/L (ref 39–117)
BUN: 17 mg/dL (ref 6–23)
BUN: 21 mg/dL (ref 6–23)
CO2: 26 mEq/L (ref 19–32)
CO2: 27 mEq/L (ref 19–32)
Calcium: 8 mg/dL — ABNORMAL LOW (ref 8.4–10.5)
Calcium: 8.1 mg/dL — ABNORMAL LOW (ref 8.4–10.5)
Chloride: 107 mEq/L (ref 96–112)
Chloride: 107 mEq/L (ref 96–112)
Creatinine, Ser: 1.81 mg/dL — ABNORMAL HIGH (ref 0.4–1.5)
Creatinine, Ser: 2.05 mg/dL — ABNORMAL HIGH (ref 0.4–1.5)
GFR calc Af Amer: 47 mL/min — ABNORMAL LOW (ref >60)
GFR calc non Af Amer: 39 mL/min — ABNORMAL LOW (ref >60)
GFR calc non Af Amer: 45 mL/min — ABNORMAL LOW (ref >60)
Glucose, Bld: 100 mg/dL — ABNORMAL HIGH (ref 70–99)
Glucose, Bld: 91 mg/dL (ref 70–99)
Potassium: 4.7 mEq/L (ref 3.5–5.1)
Sodium: 139 mEq/L (ref 135–145)
Total Bilirubin: 0.6 mg/dL (ref 0.3–1.2)
Total Protein: 5.5 g/dL — ABNORMAL LOW (ref 6.0–8.3)

## 2010-09-22 LAB — PLATELET FUNCTION ASSAY
Collagen / ADP: 221 seconds (ref 0–114)
Collagen / ADP: >300 seconds (ref 0–114)
Collagen / Epinephrine: 229 seconds (ref 0–184)
Collagen / Epinephrine: >300 seconds (ref 0–184)

## 2010-09-22 LAB — LUPUS ANTICOAGULANT PANEL
Lupus Anticoagulant: DETECTED — AB
PTTLA 4:1 Mix: 74.6 secs — ABNORMAL HIGH (ref 36.3–48.8)
PTTLA Confirmation: 34.8 secs — ABNORMAL HIGH (ref <8.0)

## 2010-09-22 LAB — APTT: aPTT: 45 seconds — ABNORMAL HIGH (ref 24–37)

## 2010-09-22 LAB — TYPE AND SCREEN: ABO/RH(D): O POS

## 2010-09-22 LAB — IRON AND TIBC
Iron: 52 ug/dL (ref 42–135)
TIBC: 152 ug/dL — ABNORMAL LOW (ref 215–435)
UIBC: 151 ug/dL

## 2010-09-22 LAB — CARDIOLIPIN ANTIBODIES, IGG, IGM, IGA
Anticardiolipin IgA: <10 APL U/mL — ABNORMAL LOW (ref <10)
Anticardiolipin IgG: <10 GPL U/mL — ABNORMAL LOW (ref <10)

## 2010-09-22 LAB — DIFFERENTIAL
Eosinophils Absolute: 0.2 10*3/uL (ref 0.0–0.7)
Lymphocytes Relative: 6 % — ABNORMAL LOW (ref 12–46)
Lymphs Abs: 0.6 10*3/uL — ABNORMAL LOW (ref 0.7–4.0)
Neutrophils Relative %: 87 % — ABNORMAL HIGH (ref 43–77)

## 2010-09-22 LAB — BETA-2-GLYCOPROTEIN I ABS, IGG/M/A
Beta-2 Glyco I IgG: <9 SGU (ref <=20)
Beta-2-Glycoprotein I IgA: <9 SAU (ref <=20)
Beta-2-Glycoprotein I IgM: <9 SMU (ref <=20)

## 2010-09-22 LAB — HAPTOGLOBIN: Haptoglobin: 191 mg/dL (ref 16–200)

## 2010-09-22 LAB — PHENYTOIN LEVEL, TOTAL
Phenytoin Lvl: 8.7 ug/mL — ABNORMAL LOW (ref 10.0–20.0)
Phenytoin Lvl: 9.8 ug/mL — ABNORMAL LOW (ref 10.0–20.0)

## 2010-09-22 LAB — PROTEIN S ACTIVITY: Protein S Activity: 51 % — ABNORMAL LOW (ref 69–129)

## 2010-09-22 LAB — PROTIME-INR: INR: 1 (ref 0.00–1.49)

## 2010-09-22 LAB — DIC (DISSEMINATED INTRAVASCULAR COAGULATION)PANEL
D-Dimer, Quant: 8.05 ug/mL-FEU — ABNORMAL HIGH (ref 0.00–0.48)
Prothrombin Time: 13.2 seconds (ref 11.6–15.2)

## 2010-09-22 LAB — VITAMIN B12: Vitamin B-12: 590 pg/mL (ref 211–911)

## 2010-09-22 LAB — LACTATE DEHYDROGENASE: LDH: 205 U/L (ref 94–250)

## 2010-09-22 LAB — HOMOCYSTEINE: Homocysteine: 20.1 umol/L — ABNORMAL HIGH (ref 4.0–15.4)

## 2010-09-22 LAB — FACTOR 5 LEIDEN

## 2010-09-22 LAB — PROTEIN S, TOTAL: Protein S Ag, Total: 86 % (ref 70–140)

## 2010-09-22 LAB — ABO/RH: ABO/RH(D): O POS

## 2010-09-22 LAB — TSH: TSH: 0.711 u[IU]/mL (ref 0.350–4.500)

## 2010-09-23 LAB — URINE CULTURE: Colony Count: NO GROWTH

## 2010-09-23 LAB — BASIC METABOLIC PANEL
CO2: 26 mEq/L (ref 19–32)
Calcium: 8 mg/dL — ABNORMAL LOW (ref 8.4–10.5)
Glucose, Bld: 85 mg/dL (ref 70–99)
Potassium: 4 mEq/L (ref 3.5–5.1)
Sodium: 139 mEq/L (ref 135–145)

## 2010-09-23 LAB — DIFFERENTIAL
Basophils Absolute: 0 10*3/uL (ref 0.0–0.1)
Basophils Absolute: 0 10*3/uL (ref 0.0–0.1)
Basophils Relative: 0 % (ref 0–1)
Basophils Relative: 0 % (ref 0–1)
Eosinophils Absolute: 0 10*3/uL (ref 0.0–0.7)
Eosinophils Relative: 0 % (ref 0–5)
Eosinophils Relative: 0 % (ref 0–5)
Lymphocytes Relative: 4 % — ABNORMAL LOW (ref 12–46)
Monocytes Absolute: 0.9 10*3/uL (ref 0.1–1.0)

## 2010-09-23 LAB — CBC
HCT: 29 % — ABNORMAL LOW (ref 39.0–52.0)
HCT: 34.4 % — ABNORMAL LOW (ref 39.0–52.0)
Hemoglobin: 9.9 g/dL — ABNORMAL LOW (ref 13.0–17.0)
MCHC: 33.4 g/dL (ref 30.0–36.0)
MCHC: 33.9 g/dL (ref 30.0–36.0)
Platelets: 61 10*3/uL — ABNORMAL LOW (ref 150–400)
RDW: 13.9 % (ref 11.5–15.5)
RDW: 13.9 % (ref 11.5–15.5)

## 2010-09-23 LAB — BLOOD GAS, ARTERIAL
Acid-base deficit: 2.8 mmol/L — ABNORMAL HIGH (ref 0.0–2.0)
Bicarbonate: 21.7 mEq/L (ref 20.0–24.0)
O2 Saturation: 74 %
pO2, Arterial: 42.8 mmHg — ABNORMAL LOW (ref 80.0–100.0)

## 2010-09-23 LAB — RAPID URINE DRUG SCREEN, HOSP PERFORMED
Opiates: NOT DETECTED
Tetrahydrocannabinol: NOT DETECTED

## 2010-09-23 LAB — POCT I-STAT, CHEM 8
Calcium, Ion: 1.17 mmol/L (ref 1.12–1.32)
Chloride: 109 mEq/L (ref 96–112)
Glucose, Bld: 81 mg/dL (ref 70–99)
HCT: 38 % — ABNORMAL LOW (ref 39.0–52.0)

## 2010-09-23 LAB — URINALYSIS, ROUTINE W REFLEX MICROSCOPIC
Glucose, UA: NEGATIVE mg/dL
Protein, ur: >300 mg/dL — AB

## 2010-09-23 LAB — BRAIN NATRIURETIC PEPTIDE: Pro B Natriuretic peptide (BNP): 68.7 pg/mL (ref 0.0–100.0)

## 2010-09-23 LAB — URINE MICROSCOPIC-ADD ON

## 2010-09-23 LAB — POCT CARDIAC MARKERS
CKMB, poc: 2.2 ng/mL (ref 1.0–8.0)
Troponin i, poc: <0.05 ng/mL (ref 0.00–0.09)

## 2010-09-24 LAB — GLUCOSE, CAPILLARY: Glucose-Capillary: 80 mg/dL (ref 70–99)

## 2010-09-27 ENCOUNTER — Other Ambulatory Visit: Payer: Self-pay | Admitting: Oncology

## 2010-09-27 ENCOUNTER — Encounter (HOSPITAL_BASED_OUTPATIENT_CLINIC_OR_DEPARTMENT_OTHER): Payer: BC Managed Care – PPO | Admitting: Oncology

## 2010-09-27 DIAGNOSIS — Z7901 Long term (current) use of anticoagulants: Secondary | ICD-10-CM

## 2010-09-27 DIAGNOSIS — Z8673 Personal history of transient ischemic attack (TIA), and cerebral infarction without residual deficits: Secondary | ICD-10-CM

## 2010-09-27 DIAGNOSIS — D696 Thrombocytopenia, unspecified: Secondary | ICD-10-CM

## 2010-09-27 LAB — BASIC METABOLIC PANEL
BUN: 16 mg/dL (ref 6–23)
BUN: 18 mg/dL (ref 6–23)
CO2: 27 mEq/L (ref 19–32)
CO2: 26 mEq/L (ref 19–32)
Calcium: 8.9 mg/dL (ref 8.4–10.5)
Chloride: 107 mEq/L (ref 96–112)
Chloride: 105 mEq/L (ref 96–112)
Creatinine, Ser: 1.64 mg/dL — ABNORMAL HIGH (ref 0.4–1.5)
Creatinine, Ser: 1.61 mg/dL — ABNORMAL HIGH (ref 0.4–1.5)
GFR calc Af Amer: >60 mL/min (ref >60)
Glucose, Bld: 90 mg/dL (ref 70–99)

## 2010-09-27 LAB — PROTIME-INR
INR: 1 (ref 0.00–1.49)
Prothrombin Time: 13.6 seconds (ref 11.6–15.2)
Prothrombin Time: 13.3 seconds (ref 11.6–15.2)

## 2010-09-27 LAB — RAPID URINE DRUG SCREEN, HOSP PERFORMED
Benzodiazepines: NOT DETECTED
Cocaine: NOT DETECTED
Opiates: NOT DETECTED

## 2010-09-27 LAB — DIFFERENTIAL
Basophils Absolute: 0 10*3/uL (ref 0.0–0.1)
Basophils Relative: 0 % (ref 0–1)
Basophils Relative: 0 % (ref 0–1)
Eosinophils Absolute: 0.2 10*3/uL (ref 0.0–0.7)
Eosinophils Absolute: 0.2 10*3/uL (ref 0.0–0.7)
Eosinophils Relative: 2 % (ref 0–5)
Lymphocytes Relative: 9 % — ABNORMAL LOW (ref 12–46)
Monocytes Absolute: 0.4 10*3/uL (ref 0.1–1.0)
Neutrophils Relative %: 78 % — ABNORMAL HIGH (ref 43–77)
Neutrophils Relative %: 84 % — ABNORMAL HIGH (ref 43–77)

## 2010-09-27 LAB — URINALYSIS, ROUTINE W REFLEX MICROSCOPIC
Bilirubin Urine: NEGATIVE
Glucose, UA: NEGATIVE mg/dL
Protein, ur: >300 mg/dL — AB
Urobilinogen, UA: 0.2 mg/dL (ref 0.0–1.0)

## 2010-09-27 LAB — URINE MICROSCOPIC-ADD ON

## 2010-09-27 LAB — CBC
MCHC: 33.8 g/dL (ref 30.0–36.0)
MCHC: 33.4 g/dL (ref 30.0–36.0)
MCV: 87.8 fL (ref 78.0–100.0)
MCV: 88 fL (ref 78.0–100.0)
Platelets: 53 10*3/uL — ABNORMAL LOW (ref 150–400)
Platelets: 36 10*3/uL — CL (ref 150–400)
RBC: 5.57 MIL/uL (ref 4.22–5.81)
RDW: 12.6 % (ref 11.5–15.5)
RDW: 12.7 % (ref 11.5–15.5)

## 2010-09-27 LAB — APTT: aPTT: 48 seconds — ABNORMAL HIGH (ref 24–37)

## 2010-09-27 LAB — GLUCOSE, CAPILLARY: Glucose-Capillary: 101 mg/dL — ABNORMAL HIGH (ref 70–99)

## 2010-11-01 ENCOUNTER — Other Ambulatory Visit: Payer: Self-pay | Admitting: Oncology

## 2010-11-01 ENCOUNTER — Encounter (HOSPITAL_BASED_OUTPATIENT_CLINIC_OR_DEPARTMENT_OTHER): Payer: BC Managed Care – PPO | Admitting: Oncology

## 2010-11-01 DIAGNOSIS — D696 Thrombocytopenia, unspecified: Secondary | ICD-10-CM

## 2010-11-01 DIAGNOSIS — Z8673 Personal history of transient ischemic attack (TIA), and cerebral infarction without residual deficits: Secondary | ICD-10-CM

## 2010-11-01 DIAGNOSIS — Z7901 Long term (current) use of anticoagulants: Secondary | ICD-10-CM

## 2010-11-03 NOTE — Discharge Summary (Signed)
[name], [name]                ACCOUNT NO.:  [id number]   MEDICAL RECORD NO.:  [id number]          PATIENT TYPE:  INP   LOCATION:  4708                         FACILITY:  [facility]   PHYSICIAN:  Pramod P. Leonie Man, MD    DATE OF BIRTH:  10-Sep-1980   DATE OF ADMISSION:  04/26/2004  DATE OF DISCHARGE:  05/02/2004                                 DISCHARGE SUMMARY   DIAGNOSES AT TIME OF DISCHARGE:  1.  Right brain infarct.  2.  History of stroke in December 2003.  3.  Positive antiphospholipid antibody syndrome.  4.  Thrombocytopenia, followed by Dr. Darrick Grinder. Whorf in the past at the      cancer center.   MEDICINES AT TIME OF DISCHARGE:  1.  Zocor 20 mg daily.  2.  Coumadin 7.5 mg day of discharge, then 5 mg daily.  3.  Foltx 1 daily.  4.  Nicotine patch 21 mg x9 more days, then decreased to 14 mg x14 days,      then 7 mg x14 days, then discontinue.   STUDIES PERFORMED:  1.  CT of the brain on admission shows no acute intracranial abnormality,      old left MCA infarct.  2.  MRI of the brain shows acute infarct in the right posterior      frontal/inferior parietal region.  Old left frontal infarct.  3.  MRA of the head shows hypoplastic right A1 segment, otherwise, no      significant finding.   LABORATORY STUDIES:  INR the day of discharge 2.0, INR the day of admission  was 1.1.  CBC normal except for low platelets ranging in the 48,000 to  61,000 range; this is chronic.  Chemistries normal.  Homocysteine normal at  13.23.  Cholesterol 158, HDL 37, LDL 107 and triglycerides 71.   HISTORY OF PRESENT ILLNESS:  [name] is a 30 year old right-handed  black male who has a past medical history of antiphospholipid antibody  syndrome with a previous stroke in December of 2003.  He was discharged on  Coumadin at that time, but became noncompliant after approximately 1 year.  The day prior to admission, he noticed an onset of numbness on the left side  of his face and hand which  has fluctuated since that time.  He was seen in  the emergency room the day prior to admission and was discharged home.  The  day of admission, he was talking to friends and they noticed he also had  slurred speech, and that the left side of his face was drooping.  He  returned to the emergency room and underwent MRI of the brain which  demonstrated an acute infarct.  He was admitted to the stroke service for  further workup.  He was not a TPA or Saint II candidate secondary to time.   HOSPITAL COURSE:  The patient was not taking Coumadin for unclear reasons,  not necessarily financial, just a hassle for a young guy to keep up with his  Coumadin levels.  He understands the importance of it and IV heparin  and  Coumadin were restarted with plans for long-term Coumadin.  The patient does  not have a primary care physician and as stated, he does not have insurance,  which makes it a little bit more difficult to obtain.  Hematology was  requested to see him, but they have nothing further to add at this time,  except for recommendations for continued Coumadin.   PT, OT and speech all saw him and they had no needs at the time of  discharge.  He did see a smoking cessation counselor and was advised to quit  smoking and started on a nicotine patch.   CONDITION AT DISCHARGE:  Patient alert and oriented x3.  Speech is clear.  There is a very mild left facial droop.  He does have some left upper  extremity clumsiness with decreased rapid repetitive motion and fine motor  movements and the right arm satellites over to the left.  Otherwise,  strength is normal, 5/5, in all extremities.  His chest is clear to  auscultation.  His heart rate is regular and his gait steady.   DISCHARGE PLAN:  1.  Discharge home.  2.  Long-term Coumadin for secondary stroke prevention.  Stroke service will      follow until [facility] can see patient after discharge and      assume responsibility.  First  blood draw will be Thursday, May 04, 2004.  3.  Nicotine patch and advised to stop smoking, no smoking while on nicotine      patch.  4.  Medicines will be set up through indigent funds through a pharmaceutical      company by [employer].       SB/MEDQ  D:  05/02/2004  T:  05/02/2004  Job:  [id number]   cc:   [facility]   Blima Ledger, M.D.  [place], [place] [address]  Fax: [phone]

## 2010-11-03 NOTE — H&P (Signed)
[name] [name], [name] [name]                ACCOUNT NO.:  [id number]   MEDICAL RECORD NO.:  [id number]          PATIENT TYPE:  INP   LOCATION:  1826                         FACILITY:  [facility]   PHYSICIAN:  Michael L. Reynolds, M.D.DATE OF BIRTH:  05-21-1981   DATE OF ADMISSION:  04/26/2004  DATE OF DISCHARGE:                                HISTORY & PHYSICAL   CHIEF COMPLAINT:  Slurred speech and left-sided numbness.   HISTORY OF PRESENT ILLNESS:  This is the second stroke service admission for  this 30 year old man with a past medical history which includes  antiphospholipid antibody syndrome with a previous stroke in December of  2003.  The patient was discharged on Coumadin at that time, but for  financial reasons has been noncompliant.  Yesterday, he noted the sudden  onset of numbness on the left side of his face and his hand, which has  fluctuated somewhat since.  He was seen in the emergency room yesterday and  subsequently sent out.  This morning, he was talking with friends, and they  noted he had slurred speech and that the left side of his face was drooping.  He returned to the emergency room, and this time underwent an MRI of the  brain, which demonstrated an acute stroke.  He is now admitted for further  work-up and management.  The patient denies any other recent stroke  symptoms.  He specifically denies any chest pain, palpitations, shortness of  breath, nausea, vomiting, or headache.   PAST MEDICAL HISTORY:  Antiphospholipid antibody syndrome diagnosed in  December 2003 as part of his stroke work-up.  He was noted to have  thrombocytopenia at that time.  Due to that, he was seen by a hematologist,  Dr. Claris Gladden, at that time.  He denies any history of hypertension, diabetes,  coronary artery disease, congenital heart disease, liver or kidney disease.   FAMILY HISTORY:  Remarkable for diabetes.   SOCIAL HISTORY:  He works at [employer].  He smokes a half a pack of  cigarettes a  day.  He does not use illicit drugs.   ALLERGIES:  He is intolerant to ASPIRIN.   MEDICATIONS:  None.  He has been on Coumadin in the past, but has not taken  this for financial reasons.   REVIEW OF SYSTEMS:  Full 10-system review of systems is negative, except as  outlined in the HPI and in the admission nursing records.   PHYSICAL EXAMINATION:  VITAL SIGNS:  Temperature 97.9, blood pressure  135/71, pulse 64, respirations 20.  GENERAL:  This is a healthy-appearing man lying supine in the hospital bed,  in no evident distress.  HEENT:  Cranium is normocephalic and atraumatic.  Oropharynx is benign.  NECK:  Supple without carotid bruits.  HEART:  Regular rate and rhythm without murmurs.  CHEST:  Clear to auscultation bilaterally.  ABDOMEN:  Obese, soft.  Normoactive bowel sounds.  EXTREMITIES:  2+ pulses, no edema.  NEUROLOGIC:  Mental status - he is awake and alert.  He is in no evident  distress.  Speech is mildly dysarthric but fluent.  He is fully oriented and  follows multi-step commands.  Pupils are equal and reactive.  Extraocular  movements full without nystagmus.  There is moderate left facial weakness.  Tongue and palate more normally and symmetrically.  Facial sensation is  symmetric.  Motor - normal bulk and tone.  Normal strength.  __________  extremity muscles.  Sensation - decreased to pinprick on the left fingers;  otherwise intact to light touch and pinprick throughout.  Coordination -  rapid movements are performed a little slowly on the left.  Finger-to-nose  is adequate.  Gait - he arises from the bed easily, and his stance if  normal.  He is able to ambulate without difficulty.  Reflexes 2+ and  symmetric.  Toes are downgoing.   LABORATORY REVIEW:  Electrolytes in the ER were unremarkable.  Hematocrit is  49%.  MRI of the brain performed today was personally reviewed and  demonstrates a smattering of acute strokes in the right parietal  subcortical, and to a  less extent the cortical areas.  The old left frontal  strokes are also noted.  MRI demonstrates no lesions.   IMPRESSION:  1.  Acute right brain stroke with mild left-sided symptoms.  2.  Antiphospholipid antibody syndrome.   PLAN:  Will admit for anticoagulation with intravenous heparin to be  switched back over to oral Coumadin.  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  We may pursue  further work-up as felt indicated by the stroke service.  The stroke service  will follow.       MLR/MEDQ  D:  04/26/2004  T:  04/26/2004  Job:  [id number]

## 2010-11-03 NOTE — Consult Note (Signed)
[name] [name]                            ACCOUNT NO.:  [id number]   MEDICAL RECORD NO.:  [id number]                   PATIENT TYPE:  INP   LOCATION:  4708                                 FACILITY:  [facility]   PHYSICIAN:  Darrick Grinder. Claris Gladden, M.D.               DATE OF BIRTH:  1981-02-09   DATE OF CONSULTATION:  06/17/2002  DATE OF DISCHARGE:                                   CONSULTATION   REASON FOR CONSULTATION:  Thrombocytopenia, stroke.   REFERRING PHYSICIANS:  1. Jill Alexanders, M.D.  [place] M. Love, M.D.   HISTORY OF PRESENT ILLNESS:  The patient is a 30 year old gentleman who  presented to the emergency department on June 14, 2002 with acute  neurological symptoms.  Head CT revealed an abnormal hypodensity involving  the left external capsule and involving the white matter in the posterior  aspect of the left frontal lobe.  Followup MRI of the brain showed acute  infarctions in the left insular and parietal lobe; small nonacute infarction  right parietal cortex.  MRA of the head showed an occluded Ryce of the  left middle cerebral artery corresponding to the acute infarction in the  left parietal lobe and insula.  MRA of the neck was negative.   LABORATORY DATA:  Admission lab work showed a hemoglobin of 15.6, white  blood cell count 7.0, platelet count 82,000.  PT 13.6, PTT 71.  D-dimer  0.83.  CMET normal except for potassium of 3.4, glucose 111, and SGPT 49.  LDH 183, troponin 0.05, and normal CK-MB.  Other lab work showed an ESR of  10, antithrombin III 109% (normal 75-120), functional protein C 94% (normal  91-147), functional protein S 76% (normal 81-180), homocystine 17.98 (normal  5-13.9), negative HIV and negative urine drug screen.  ANA, factor V Leiden,  prothrombin G mutation, and anticardiolipin antibodies pending.   PAST MEDICAL HISTORY:  None.   CURRENT MEDICATIONS:  1. Aspirin 650 mg daily.  2. Foltx 1 tablet daily.   ALLERGIES:  No known  drug allergies.   FAMILY HISTORY:  Mother has diabetes mellitus; father healthy; no siblings.  Maternal grandfather deceased with a stroke.   SOCIAL HISTORY:  The patient lives in [place].  He is single.  He has no  children.  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  He reports tobacco  use at one pack every three days since 2001.  He reports weekly ETOH intake.  He denies any drug use.   REVIEW OF SYSTEMS:  The patient denies any weight loss.  He has had no fever  or night sweats.  His appetite has been good.  His energy level has been  good.  He denies any pain except for an occasional headache.  He denies any  vision changes.  He does report occasional epistaxis and gum bleeding with  brushing.  He  denies any shortness of breath or cough.  He has had no chest  pain.  He denies any change in his bowel habits and has had no rectal  bleeding.  He denies any hematuria or dysuria.  He continues to experience  some difficulty with his speech.  He also has some numbness in his hands.  He denies any gait disturbance or focal weakness.   PHYSICAL EXAMINATION:  VITAL SIGNS:  Temperature 98.0, heart rate 68,  respirations 16, blood pressure 146/81.  O2 saturation 98% on room air.  GENERAL:  Well-nourished African American male in no acute distress.  HEENT:  Normocephalic, atraumatic.  Pupils are equal, round, and reactive to  light; extraocular movements are intact; sclerae anicteric.  Oropharynx is  clear; no active bleeding.  LYMPHATICS:  No palpable cervical, supraclavicular, or axillary lymph nodes.  CHEST:  Lungs are clear bilaterally.  CARDIOVASCULAR:  Regular rate and rhythm.  ABDOMEN:  Soft and nontender.  Obese.  No obvious hepatosplenomegaly.  EXTREMITIES:  No cyanosis, clubbing, or edema.  NEUROLOGIC:  Alert and oriented x3.  Follows commands.  Some word-finding  difficulty.  Motor strength is 5/5.   LABORATORY DATA:  Hemoglobin 14.1, white count 6, platelets 62,000.   Sodium  140, potassium 3.8, BUN 8, creatinine 1.0, glucose 98, calcium 8.6.  PT  13.1, PTT 66.  Homocystine 17.98, functional protein S 76%, functional  protein C 94%, antithrombin III 109%.  HIV negative.  LDH 183.   Peripheral blood smear:  Large platelets; no schistocytes.   IMPRESSION AND PLAN:  The patient is a 30 year old gentleman with no past  medical history who presents with acute neurological symptoms, no prodromal  illness, with MRI showing acute infarction (one old) and MRA with a blocked  Weissinger of the left middle cerebral artery consistent with thrombosis.  Admission lab work was remarkable for a PTT of 77 on arrival (no heparin)  and a platelet count of approximately 82,000.  Other routine labs were  unremarkable.  He has a mildly elevated homocystine and mildly decreased  protein C and S.   Most likely etiology is a hypercoagulable state secondary to lupus  anticoagulant with a falsely elevated PTT (lab anomaly) and a low platelet  count either secondary to this or idiopathic thrombocytopenic purpura (large  platelets on smear).  He adamantly denies any recent drug use.   RECOMMENDATIONS:  1. Heparin bolus/drip.  Follow heparin levels with a goal of approximately     0.5.  2. Start Coumadin.  3. Check antiphospholipid antibodies, beta-2 glycoprotein one panel, PTT     mixing study.  4. Begin folate for the elevated homocystine.  5. Check acute hepatitis panel.  6. Social work consult for medications.   We will continue to follow with you.   The patient seen and examined by Dr. Claris Gladden; chart and peripheral blood smear  reviewed.     [name] [name], N.P.                         Darrick Grinder. Claris Gladden, M.D.    LT/MEDQ  D:  06/17/2002  T:  06/18/2002  Job:  [id number]   cc:   Alyson Locket. Love, M.D.  [address] [place] [address]  Fax: [phone]   C. Floyde Parkins, M.D. [address] [place] [address]  Fax: [phone]

## 2010-11-15 ENCOUNTER — Encounter (HOSPITAL_BASED_OUTPATIENT_CLINIC_OR_DEPARTMENT_OTHER): Payer: BC Managed Care – PPO | Admitting: Oncology

## 2010-11-15 ENCOUNTER — Other Ambulatory Visit: Payer: Self-pay | Admitting: Oncology

## 2010-11-15 DIAGNOSIS — D696 Thrombocytopenia, unspecified: Secondary | ICD-10-CM

## 2010-11-15 DIAGNOSIS — Z7901 Long term (current) use of anticoagulants: Secondary | ICD-10-CM

## 2010-11-15 DIAGNOSIS — Z8673 Personal history of transient ischemic attack (TIA), and cerebral infarction without residual deficits: Secondary | ICD-10-CM

## 2010-11-15 LAB — PROTIME-INR: Protime: 26.4 Seconds — ABNORMAL HIGH (ref 10.6–13.4)

## 2010-12-27 ENCOUNTER — Other Ambulatory Visit: Payer: Self-pay | Admitting: Oncology

## 2010-12-27 ENCOUNTER — Encounter (HOSPITAL_BASED_OUTPATIENT_CLINIC_OR_DEPARTMENT_OTHER): Payer: Managed Care, Other (non HMO) | Admitting: Oncology

## 2010-12-27 DIAGNOSIS — D696 Thrombocytopenia, unspecified: Secondary | ICD-10-CM

## 2010-12-27 DIAGNOSIS — Z8673 Personal history of transient ischemic attack (TIA), and cerebral infarction without residual deficits: Secondary | ICD-10-CM

## 2010-12-27 DIAGNOSIS — Z7901 Long term (current) use of anticoagulants: Secondary | ICD-10-CM

## 2010-12-27 DIAGNOSIS — D6859 Other primary thrombophilia: Secondary | ICD-10-CM

## 2010-12-27 LAB — PROTIME-INR
INR: 2.5 (ref 2.00–3.50)
Protime: 30 Seconds — ABNORMAL HIGH (ref 10.6–13.4)

## 2011-01-24 ENCOUNTER — Other Ambulatory Visit: Payer: Self-pay | Admitting: Oncology

## 2011-01-24 ENCOUNTER — Encounter (HOSPITAL_BASED_OUTPATIENT_CLINIC_OR_DEPARTMENT_OTHER): Payer: Managed Care, Other (non HMO) | Admitting: Oncology

## 2011-01-24 DIAGNOSIS — Z8673 Personal history of transient ischemic attack (TIA), and cerebral infarction without residual deficits: Secondary | ICD-10-CM

## 2011-01-24 DIAGNOSIS — D6859 Other primary thrombophilia: Secondary | ICD-10-CM

## 2011-01-24 DIAGNOSIS — Z7901 Long term (current) use of anticoagulants: Secondary | ICD-10-CM

## 2011-01-24 DIAGNOSIS — D696 Thrombocytopenia, unspecified: Secondary | ICD-10-CM

## 2011-01-24 LAB — CBC WITH DIFFERENTIAL/PLATELET
Basophils Absolute: 0 10*3/uL (ref 0.0–0.1)
EOS%: 1.7 % (ref 0.0–7.0)
HGB: 15.5 g/dL (ref 13.0–17.1)
LYMPH%: 21.8 % (ref 14.0–49.0)
MCH: 28.9 pg (ref 27.2–33.4)
MCV: 87.2 fL (ref 79.3–98.0)
MONO%: 10 % (ref 0.0–14.0)
Platelets: 158 10*3/uL (ref 140–400)
RDW: 12.9 % (ref 11.0–14.6)

## 2011-01-24 LAB — PROTIME-INR
INR: 2 (ref 2.00–3.50)
Protime: 24 Seconds — ABNORMAL HIGH (ref 10.6–13.4)

## 2011-02-27 ENCOUNTER — Other Ambulatory Visit: Payer: Self-pay | Admitting: Oncology

## 2011-02-27 ENCOUNTER — Encounter (HOSPITAL_BASED_OUTPATIENT_CLINIC_OR_DEPARTMENT_OTHER): Payer: Managed Care, Other (non HMO) | Admitting: Oncology

## 2011-02-27 DIAGNOSIS — Z8673 Personal history of transient ischemic attack (TIA), and cerebral infarction without residual deficits: Secondary | ICD-10-CM

## 2011-02-27 DIAGNOSIS — D6859 Other primary thrombophilia: Secondary | ICD-10-CM

## 2011-02-27 DIAGNOSIS — Z7901 Long term (current) use of anticoagulants: Secondary | ICD-10-CM

## 2011-02-27 LAB — PROTIME-INR
INR: 2.8 (ref 2.00–3.50)
Protime: 33.6 Seconds — ABNORMAL HIGH (ref 10.6–13.4)

## 2011-03-28 ENCOUNTER — Encounter (HOSPITAL_BASED_OUTPATIENT_CLINIC_OR_DEPARTMENT_OTHER): Payer: Managed Care, Other (non HMO) | Admitting: Oncology

## 2011-03-28 ENCOUNTER — Other Ambulatory Visit: Payer: Self-pay | Admitting: Oncology

## 2011-03-28 DIAGNOSIS — Z7901 Long term (current) use of anticoagulants: Secondary | ICD-10-CM

## 2011-03-28 DIAGNOSIS — D6859 Other primary thrombophilia: Secondary | ICD-10-CM

## 2011-03-28 DIAGNOSIS — Z8673 Personal history of transient ischemic attack (TIA), and cerebral infarction without residual deficits: Secondary | ICD-10-CM

## 2011-03-28 LAB — PROTIME-INR: Protime: 28.8 Seconds — ABNORMAL HIGH (ref 10.6–13.4)

## 2011-04-24 DIAGNOSIS — R760 Raised antibody titer: Secondary | ICD-10-CM | POA: Insufficient documentation

## 2011-04-24 DIAGNOSIS — Z7901 Long term (current) use of anticoagulants: Secondary | ICD-10-CM | POA: Insufficient documentation

## 2011-05-09 ENCOUNTER — Other Ambulatory Visit: Payer: Self-pay | Admitting: Pharmacist

## 2011-05-09 DIAGNOSIS — Z7901 Long term (current) use of anticoagulants: Secondary | ICD-10-CM

## 2011-05-09 DIAGNOSIS — R894 Abnormal immunological findings in specimens from other organs, systems and tissues: Secondary | ICD-10-CM

## 2011-05-23 ENCOUNTER — Other Ambulatory Visit: Payer: Managed Care, Other (non HMO) | Admitting: Lab

## 2011-05-23 ENCOUNTER — Ambulatory Visit: Payer: Managed Care, Other (non HMO)

## 2011-05-23 ENCOUNTER — Telehealth: Payer: Self-pay | Admitting: Pharmacist

## 2011-05-27 ENCOUNTER — Encounter: Payer: Self-pay | Admitting: *Deleted

## 2011-05-27 ENCOUNTER — Emergency Department (HOSPITAL_COMMUNITY)
Admission: EM | Admit: 2011-05-27 | Discharge: 2011-05-27 | Disposition: A | Discharge status: Home / Self Care | Payer: Managed Care, Other (non HMO) | Attending: Emergency Medicine | Admitting: Emergency Medicine

## 2011-05-27 DIAGNOSIS — T07XXXA Unspecified multiple injuries, initial encounter: Secondary | ICD-10-CM | POA: Insufficient documentation

## 2011-05-27 DIAGNOSIS — Z7901 Long term (current) use of anticoagulants: Secondary | ICD-10-CM | POA: Insufficient documentation

## 2011-05-27 DIAGNOSIS — Z8673 Personal history of transient ischemic attack (TIA), and cerebral infarction without residual deficits: Secondary | ICD-10-CM | POA: Insufficient documentation

## 2011-05-27 DIAGNOSIS — X58XXXA Exposure to other specified factors, initial encounter: Secondary | ICD-10-CM | POA: Insufficient documentation

## 2011-05-27 HISTORY — DX: Unspecified convulsions: R56.9

## 2011-05-27 HISTORY — DX: Cerebral infarction, unspecified: I63.9

## 2011-05-27 LAB — DIFFERENTIAL
Basophils Absolute: 0 10*3/uL (ref 0.0–0.1)
Basophils Relative: 0 % (ref 0–1)
Eosinophils Absolute: 0.1 10*3/uL (ref 0.0–0.7)
Monocytes Relative: 9 % (ref 3–12)
Neutro Abs: 5 10*3/uL (ref 1.7–7.7)
Neutrophils Relative %: 71 % (ref 43–77)

## 2011-05-27 LAB — POCT I-STAT, CHEM 8
Chloride: 106 mEq/L (ref 96–112)
Glucose, Bld: 102 mg/dL — ABNORMAL HIGH (ref 70–99)
HCT: 55 % — ABNORMAL HIGH (ref 39.0–52.0)
Hemoglobin: 18.7 g/dL — ABNORMAL HIGH (ref 13.0–17.0)
Potassium: 4.2 mEq/L (ref 3.5–5.1)
Sodium: 144 mEq/L (ref 135–145)

## 2011-05-27 LAB — CBC
MCH: 29.2 pg (ref 26.0–34.0)
MCHC: 34 g/dL (ref 30.0–36.0)
Platelets: 136 10*3/uL — ABNORMAL LOW (ref 150–400)
RDW: 12.1 % (ref 11.5–15.5)

## 2011-05-27 LAB — PROTIME-INR
INR: 4.18 — ABNORMAL HIGH (ref 0.00–1.49)
Prothrombin Time: 41 seconds — ABNORMAL HIGH (ref 11.6–15.2)

## 2011-05-27 NOTE — ED Notes (Signed)
MD at bedside. 

## 2011-05-27 NOTE — ED Notes (Signed)
Pt reports started to have bruising on Thursday and first bruise on lower right extremity.  Left leg and right chest also has bruising. Pt has a history of strokes and is on coumadin. Pt reports he missed his appointment to check his INR on Wednesday. Pt on 10mg  of coumadin each day.

## 2011-05-27 NOTE — ED Notes (Signed)
Patient is resting comfortably. 

## 2011-05-27 NOTE — ED Provider Notes (Signed)
History     CSN: [id number] Arrival date & time: 05/27/2011 10:56 AM   First MD Initiated Contact with Patient 05/27/11 1114      Chief Complaint  Patient presents with  . Bleeding/Bruising    (Consider location/radiation/quality/duration/timing/severity/associated sxs/prior treatment) The history is provided by the patient.  Pt is on 10 mg Coumadin daily 2/2 hx CVA, which he has been taking for several years. His INR is followed at a Coumadin clinic and is typically checked monthly. He was scheduled to be checked on Weds but missed his appt. He noted bruising to his RLE on Thurs; more bruising appeared on L leg, R chest subsequently. He denies noting any gross bleeding such as from oral mucosa or rectum. He generally feels well today.  Denies any change in consumption of leafy greens in the past few days.  Past Medical History  Diagnosis Date  . Stroke   . Seizures     Past Surgical History  Procedure Date  . Renal biopsy     Family History  Problem Relation Age of Onset  . Diabetes Mother   . Diabetes Other     History  Substance Use Topics  . Smoking status: Never Smoker   . Smokeless tobacco: Not on file  . Alcohol Use: 0.6 oz/week    1 Cans of beer per week      Review of Systems  Constitutional: Negative.   HENT: Negative for congestion, rhinorrhea, trouble swallowing and dental problem.   Eyes: Negative for discharge.  Respiratory: Negative for chest tightness and shortness of breath.   Cardiovascular: Negative for chest pain and palpitations.  Gastrointestinal: Negative for nausea, vomiting, abdominal pain and anal bleeding.  Genitourinary: Negative for hematuria.  Musculoskeletal: Negative for myalgias.  Skin: Negative for color change and rash.  Neurological: Negative for dizziness, weakness and headaches.  Hematological: Negative for adenopathy. Bruises/bleeds easily.    Allergies  Review of patient's allergies indicates no known  allergies.  Home Medications   Current Outpatient Rx  Name Route Sig Dispense Refill  . ATORVASTATIN CALCIUM 10 MG PO TABS Oral Take 10 mg by mouth daily.      . WARFARIN SODIUM 10 MG PO TABS Oral Take 10 mg by mouth daily.        BP 142/92  Pulse 81  Temp(Src) 99.5 F (37.5 C) (Oral)  Ht 6\' 2"  (1.88 m)  Wt 265 lb (120.203 kg)  BMI 34.02 kg/m2  SpO2 100%  Physical Exam  Nursing note and vitals reviewed. Constitutional: He is oriented to person, place, and time. He appears well-developed and well-nourished. No distress.  HENT:  Head: Normocephalic and atraumatic.  Right Ear: External ear normal.  Left Ear: External ear normal.  Eyes: Conjunctivae are normal. Pupils are equal, round, and reactive to light.  Neck: Normal range of motion.  Cardiovascular: Normal rate, regular rhythm and normal heart sounds.   Pulmonary/Chest: Effort normal and breath sounds normal.  Abdominal: Soft. There is no tenderness.  Neurological: He is alert and oriented to person, place, and time. No cranial nerve deficit.  Skin: Skin is warm, dry and intact. Bruising noted. He is not diaphoretic.       ED Course  Procedures (including critical care time)  Labs Reviewed  PROTIME-INR - Abnormal; Notable for the following:    Prothrombin Time 41.0 (*)    INR 4.18 (*)    All other components within normal limits  CBC - Abnormal; Notable for the following:  Platelets 136 (*)    All other components within normal limits  POCT I-STAT, CHEM 8 - Abnormal; Notable for the following:    Creatinine, Ser 1.90 (*)    Glucose, Bld 102 (*)    Hemoglobin 18.7 (*)    HCT 55.0 (*)    All other components within normal limits  DIFFERENTIAL  LAB REPORT - SCANNED   No results found.   1. Chronic anticoagulation       MDM  11:24 AM Pt assessed. Bruising to LEs b/l and one bruise noted to R chest. Bloodwork drawn to obtain INR; will reassess.  12:40 PM INR supratherapeutic to 4.18. Will have pt  hold dose of coumadin today and follow up with coumadin clinic tomorrow to get INR rechecked. Pt informed of lab values and plan discussed. He verbalized understanding and agreed to plan.     Abran Richard, [place] 05/28/11 1255

## 2011-05-28 ENCOUNTER — Ambulatory Visit (HOSPITAL_BASED_OUTPATIENT_CLINIC_OR_DEPARTMENT_OTHER): Payer: Managed Care, Other (non HMO)

## 2011-05-28 ENCOUNTER — Telehealth: Payer: Self-pay | Admitting: *Deleted

## 2011-05-28 ENCOUNTER — Telehealth: Payer: Self-pay | Admitting: Oncology

## 2011-05-28 ENCOUNTER — Ambulatory Visit (HOSPITAL_BASED_OUTPATIENT_CLINIC_OR_DEPARTMENT_OTHER): Payer: Self-pay | Admitting: Pharmacist

## 2011-05-28 ENCOUNTER — Ambulatory Visit: Payer: Managed Care, Other (non HMO)

## 2011-05-28 ENCOUNTER — Other Ambulatory Visit: Payer: Self-pay | Admitting: Oncology

## 2011-05-28 DIAGNOSIS — D6859 Other primary thrombophilia: Secondary | ICD-10-CM

## 2011-05-28 DIAGNOSIS — R760 Raised antibody titer: Secondary | ICD-10-CM

## 2011-05-28 DIAGNOSIS — R894 Abnormal immunological findings in specimens from other organs, systems and tissues: Secondary | ICD-10-CM

## 2011-05-28 DIAGNOSIS — Z7901 Long term (current) use of anticoagulants: Secondary | ICD-10-CM

## 2011-05-28 DIAGNOSIS — Z8673 Personal history of transient ischemic attack (TIA), and cerebral infarction without residual deficits: Secondary | ICD-10-CM

## 2011-05-28 LAB — POCT INR: INR: 3.6

## 2011-05-28 NOTE — Telephone Encounter (Signed)
per message from the pharmacy made patient appointment for 06-06-2011 at 8:30 for lab and 8:45am coumdin clinic

## 2011-05-28 NOTE — Telephone Encounter (Signed)
Pt came by for a walkin in lab and coumadin clinic

## 2011-05-28 NOTE — ED Provider Notes (Signed)
Medical screening examination/treatment/procedure(s) were performed by non-physician practitioner and as supervising physician I was immediately available for consultation/collaboration.  Threasa Beards, MD 05/28/11 [id number]

## 2011-05-28 NOTE — Progress Notes (Signed)
Pt had increased bruising (4 total; 2 on R leg; one on each arm) that became concerning so pt went to ED yesterday (05/27/11).  INR there was 4.1.  Pt was instructed to hold his Coumadin yesterday & f/u w/ coumadin clinic. Pt drank 2 beers on Friday.  This is likely the culprit since pt states he has not had any alcohol in over 1 1/2 years & his INR has been stable on Coumadin 10 mg/day. We reviewed the importance of avoiding multiple drinks in a short amount of time while on Coumadin. He planned to "get wasted" this weekend since it is his birthday but now that he knows the risk of doing so while being on Coumadin, he says he will not do that.  Instead he may drink one glass of wine. Pt requests protime draw next Wed 06/06/11. Budd Palmer, Pharm.D.

## 2011-06-06 ENCOUNTER — Other Ambulatory Visit (HOSPITAL_BASED_OUTPATIENT_CLINIC_OR_DEPARTMENT_OTHER): Payer: Managed Care, Other (non HMO) | Admitting: Lab

## 2011-06-06 ENCOUNTER — Ambulatory Visit: Payer: Managed Care, Other (non HMO)

## 2011-06-06 ENCOUNTER — Ambulatory Visit (HOSPITAL_BASED_OUTPATIENT_CLINIC_OR_DEPARTMENT_OTHER): Payer: Managed Care, Other (non HMO) | Admitting: Oncology

## 2011-06-06 DIAGNOSIS — R894 Abnormal immunological findings in specimens from other organs, systems and tissues: Secondary | ICD-10-CM

## 2011-06-06 DIAGNOSIS — Z7901 Long term (current) use of anticoagulants: Secondary | ICD-10-CM

## 2011-06-06 DIAGNOSIS — R760 Raised antibody titer: Secondary | ICD-10-CM

## 2011-06-06 LAB — PROTIME-INR: Protime: 25.2 Seconds — ABNORMAL HIGH (ref 10.6–13.4)

## 2011-06-06 LAB — POCT INR: INR: 2.1

## 2011-06-06 NOTE — Progress Notes (Signed)
INR therapeutic. Continue 10mg  daily.  Recheck INR in 1 week to ensure that pt stays within therapeutic range.  If he is supratherapeutic again, he may need his weekly dose decreased to ~50mg /week.

## 2011-06-13 ENCOUNTER — Other Ambulatory Visit: Payer: Managed Care, Other (non HMO) | Admitting: Lab

## 2011-06-13 ENCOUNTER — Ambulatory Visit: Payer: Managed Care, Other (non HMO)

## 2011-06-15 ENCOUNTER — Telehealth: Payer: Self-pay | Admitting: Pharmacist

## 2011-06-22 ENCOUNTER — Telehealth: Payer: Self-pay | Admitting: Pharmacist

## 2011-06-29 ENCOUNTER — Telehealth: Payer: Self-pay | Admitting: Pharmacist

## 2011-07-05 ENCOUNTER — Telehealth: Payer: Self-pay | Admitting: Pharmacist

## 2011-07-05 NOTE — Telephone Encounter (Signed)
Called and left vm to reschedule Coumadin Clinic appointment

## 2011-07-10 ENCOUNTER — Telehealth: Payer: Self-pay | Admitting: Pharmacist

## 2011-07-11 ENCOUNTER — Other Ambulatory Visit: Payer: Managed Care, Other (non HMO)

## 2011-07-11 ENCOUNTER — Ambulatory Visit: Payer: Managed Care, Other (non HMO)

## 2011-07-11 DIAGNOSIS — R760 Raised antibody titer: Secondary | ICD-10-CM

## 2011-07-11 DIAGNOSIS — Z7901 Long term (current) use of anticoagulants: Secondary | ICD-10-CM

## 2011-07-11 LAB — POCT INR: INR: 2.6

## 2011-07-11 LAB — PROTIME-INR: Protime: 31.2 Seconds — ABNORMAL HIGH (ref 10.6–13.4)

## 2011-07-11 NOTE — Progress Notes (Unsigned)
Continue 10mg  daily.  Return in 1 month to check PT/INR.

## 2011-08-01 ENCOUNTER — Other Ambulatory Visit: Payer: Self-pay | Admitting: Oncology

## 2011-08-02 ENCOUNTER — Other Ambulatory Visit: Payer: Self-pay

## 2011-08-02 NOTE — Telephone Encounter (Signed)
Received message from [place] with Dr. Arty Baumgartner at [facility], stating they received a request for pt's coumadin, and this is not handled by them.  Called her back [phone] and left message stating that this has been handled through this office.

## 2011-08-07 ENCOUNTER — Other Ambulatory Visit: Payer: Self-pay | Admitting: Pharmacist

## 2011-08-07 DIAGNOSIS — R894 Abnormal immunological findings in specimens from other organs, systems and tissues: Secondary | ICD-10-CM

## 2011-08-07 DIAGNOSIS — Z7901 Long term (current) use of anticoagulants: Secondary | ICD-10-CM

## 2011-08-08 ENCOUNTER — Other Ambulatory Visit: Payer: Managed Care, Other (non HMO)

## 2011-08-08 ENCOUNTER — Ambulatory Visit (HOSPITAL_BASED_OUTPATIENT_CLINIC_OR_DEPARTMENT_OTHER): Payer: Managed Care, Other (non HMO) | Admitting: Pharmacist

## 2011-08-08 DIAGNOSIS — R894 Abnormal immunological findings in specimens from other organs, systems and tissues: Secondary | ICD-10-CM

## 2011-08-08 DIAGNOSIS — Z7901 Long term (current) use of anticoagulants: Secondary | ICD-10-CM

## 2011-08-08 DIAGNOSIS — R760 Raised antibody titer: Secondary | ICD-10-CM

## 2011-08-08 LAB — PROTIME-INR: INR: 1.8 — ABNORMAL LOW (ref 2.00–3.50)

## 2011-08-08 NOTE — Progress Notes (Signed)
INR subtherapeutic (1.8).  Since he has been stable on 10mg  daily for the last 3 months, will continue current dose for now.  Recheck INR in 1 month.

## 2011-08-18 ENCOUNTER — Emergency Department (HOSPITAL_COMMUNITY)
Admission: EM | Admit: 2011-08-18 | Discharge: 2011-08-18 | Disposition: A | Discharge status: Home / Self Care | Payer: Managed Care, Other (non HMO) | Attending: Emergency Medicine | Admitting: Emergency Medicine

## 2011-08-18 ENCOUNTER — Encounter (HOSPITAL_COMMUNITY): Payer: Self-pay | Admitting: Emergency Medicine

## 2011-08-18 DIAGNOSIS — G40909 Epilepsy, unspecified, not intractable, without status epilepticus: Secondary | ICD-10-CM | POA: Insufficient documentation

## 2011-08-18 DIAGNOSIS — Z7901 Long term (current) use of anticoagulants: Secondary | ICD-10-CM | POA: Insufficient documentation

## 2011-08-18 DIAGNOSIS — M7989 Other specified soft tissue disorders: Secondary | ICD-10-CM | POA: Insufficient documentation

## 2011-08-18 DIAGNOSIS — Z79899 Other long term (current) drug therapy: Secondary | ICD-10-CM | POA: Insufficient documentation

## 2011-08-18 DIAGNOSIS — Z8673 Personal history of transient ischemic attack (TIA), and cerebral infarction without residual deficits: Secondary | ICD-10-CM | POA: Insufficient documentation

## 2011-08-18 LAB — CBC
Hemoglobin: 14.9 g/dL (ref 13.0–17.0)
MCH: 29.4 pg (ref 26.0–34.0)
MCHC: 33.9 g/dL (ref 30.0–36.0)
MCV: 86.8 fL (ref 78.0–100.0)
RBC: 5.06 MIL/uL (ref 4.22–5.81)

## 2011-08-18 LAB — BASIC METABOLIC PANEL
BUN: 23 mg/dL (ref 6–23)
Chloride: 105 mEq/L (ref 96–112)
Glucose, Bld: 98 mg/dL (ref 70–99)
Potassium: 4.4 mEq/L (ref 3.5–5.1)

## 2011-08-18 NOTE — ED Provider Notes (Signed)
History     CSN: [id number]  Arrival date & time 08/18/11  [id number]   First MD Initiated Contact with Patient 08/18/11 0515      No chief complaint on file.   (Consider location/radiation/quality/duration/timing/severity/associated sxs/prior treatment) HPI Comments: Patient with a history of clotting disorder and lupus and 2 previous strokes.  Currently taking 10 mg of Coumadin daily.  He notices his left lower leg was swollen yesterday but came into the emergency room tonight to have evaluated  The history is provided by the patient.    Past Medical History  Diagnosis Date  . Stroke   . Seizures     Past Surgical History  Procedure Date  . Renal biopsy     Family History  Problem Relation Age of Onset  . Diabetes Mother   . Diabetes Other     History  Substance Use Topics  . Smoking status: Never Smoker   . Smokeless tobacco: Not on file  . Alcohol Use: 0.6 oz/week    1 Cans of beer per week     occassional      Review of Systems  Constitutional: Negative for fever and chills.  Respiratory: Negative for shortness of breath.   Musculoskeletal: Negative for arthralgias.  Skin: Negative for wound.  Neurological: Negative for dizziness, weakness and numbness.    Allergies  Review of patient's allergies indicates no known allergies.  Home Medications   Current Outpatient Rx  Name Route Sig Dispense Refill  . ATORVASTATIN CALCIUM 10 MG PO TABS Oral Take 10 mg by mouth daily.      Marland Kitchen DILTIAZEM HCL ER 240 MG PO CP24 Oral Take 240 mg by mouth daily.      Marland Kitchen LAMICTAL 150 MG PO TABS Oral Take 1 tablet by mouth Twice daily.    Marland Kitchen LISINOPRIL-HYDROCHLOROTHIAZIDE 20-12.5 MG PO TABS Oral Take 1 tablet by mouth Daily.    Marland Kitchen MYCOPHENOLATE MOFETIL 500 MG PO TABS Oral Take 1,000 mg by mouth 2 (two) times daily.      . WARFARIN SODIUM 10 MG PO TABS  TAKE ONE TABLET BY MOUTH DAILY OR AS DIRECTED. 30 tablet 2    BP 138/86  Pulse 77  Temp(Src) 98.5 F (36.9 C) (Oral)  Resp 18   SpO2 99%  Physical Exam  Constitutional: He is oriented to person, place, and time. He appears well-developed and well-nourished.  HENT:  Head: Normocephalic.  Eyes: Pupils are equal, round, and reactive to light.  Neck: Normal range of motion.  Cardiovascular: Normal rate.   Pulmonary/Chest: Effort normal.  Musculoskeletal: Normal range of motion. He exhibits no edema and no tenderness.  Neurological: He is alert and oriented to person, place, and time.  Skin: Skin is warm and dry.    ED Course  Procedures (including critical care time)   Labs Reviewed  CBC  PROTIME-INR   No results found.   No diagnosis found.    MDM  Check CBC i-STAT, INR, obtain vascular Doppler in the morning        Garald Balding, NP 08/18/11 [phone]

## 2011-08-18 NOTE — Discharge Instructions (Signed)
Your doppler study did not show any clot. Your INR was 3.0 today. Please continue taking your coumadin as you normally would. Return to the ER if needed for any new or worsening symptoms if needed.

## 2011-08-18 NOTE — ED Notes (Signed)
Pt states he has swelling noted to his left lower leg  States he noticed it yesterday as he was putting his clothes on  Denies injury  Denies pain  Pt states he is on coumadin and just wants to be evaluated

## 2011-08-18 NOTE — ED Notes (Signed)
Report received, airway intact-pt resting quietly, no s/s's of distress

## 2011-08-18 NOTE — Progress Notes (Signed)
VASCULAR LAB PRELIMINARY  PRELIMINARY  PRELIMINARY  PRELIMINARY  Left lower extremity venous Doppler completed.    Preliminary report:  No DVT or SVT noted in the left lower extremity.  Sharion Dove [place], 08/18/2011, 9:48 AM

## 2011-08-18 NOTE — ED Provider Notes (Signed)
6:40 AM Pt care assumed from NP [facility]. Pt to ED overnight for evaluation of subjective LLE swelling in the setting of known coagulopathy with lifelong coumadin therapy. Awaiting doppler study of extremity to r/o DVT. On my exam, pt is alert and oriented x3, NAD. Heart RRR. Normal respiratory effort and excursion. MAEW. No edema or tenderness to BLE. Bilateral DP pulses 2+. No needs at this time.     9:36 AM Doppler study of LLE negative for DVT. Pt will be d/c home. INR therapeutic at 3.0. Advised to f/u with PCP.  [place], [place] 08/18/11 [phone]

## 2011-08-19 NOTE — ED Provider Notes (Signed)
History/physical exam/procedure(s) were performed by non-physician practitioner and as supervising physician I was immediately available for consultation/collaboration. I have reviewed all notes and am in agreement with care and plan.   Shaune Pollack, MD 08/19/11 [phone]

## 2011-08-19 NOTE — ED Provider Notes (Signed)
History/physical exam/procedure(s) were performed by non-physician practitioner and as supervising physician I was immediately available for consultation/collaboration. I have reviewed all notes and am in agreement with care and plan.   Shaune Pollack, MD 08/19/11 [phone]

## 2011-09-05 ENCOUNTER — Ambulatory Visit: Payer: Managed Care, Other (non HMO)

## 2011-09-05 ENCOUNTER — Other Ambulatory Visit: Payer: Managed Care, Other (non HMO)

## 2011-09-05 ENCOUNTER — Telehealth: Payer: Self-pay | Admitting: Pharmacist

## 2011-09-05 NOTE — Telephone Encounter (Signed)
Called and left vm for patient to reschedule his missed appt today.

## 2011-10-01 ENCOUNTER — Telehealth: Payer: Self-pay | Admitting: Pharmacist

## 2011-10-17 ENCOUNTER — Telehealth: Payer: Self-pay | Admitting: Pharmacist

## 2011-11-04 ENCOUNTER — Encounter (HOSPITAL_COMMUNITY): Payer: Self-pay

## 2011-11-04 ENCOUNTER — Emergency Department (HOSPITAL_COMMUNITY)
Admission: EM | Admit: 2011-11-04 | Discharge: 2011-11-04 | Disposition: A | Discharge status: Home / Self Care | Payer: Managed Care, Other (non HMO) | Attending: Emergency Medicine | Admitting: Emergency Medicine

## 2011-11-04 DIAGNOSIS — M7989 Other specified soft tissue disorders: Secondary | ICD-10-CM

## 2011-11-04 DIAGNOSIS — Z8679 Personal history of other diseases of the circulatory system: Secondary | ICD-10-CM | POA: Insufficient documentation

## 2011-11-04 DIAGNOSIS — R569 Unspecified convulsions: Secondary | ICD-10-CM | POA: Insufficient documentation

## 2011-11-04 DIAGNOSIS — M79609 Pain in unspecified limb: Secondary | ICD-10-CM

## 2011-11-04 NOTE — ED Provider Notes (Signed)
History     CSN: [id number]  Arrival date & time 11/04/11  33   First MD Initiated Contact with Patient 11/04/11 1601      Chief Complaint  Patient presents with  . Leg Pain    (Consider location/radiation/quality/duration/timing/severity/associated sxs/prior treatment) Patient is a 31 y.o. male presenting with leg pain. The history is provided by the patient.  Leg Pain  The incident occurred more than 1 week ago. There was no injury mechanism. Pain location: right lower leg. The quality of the pain is described as aching. The pain is at a severity of 4/10. The pain is mild. The pain has been constant since onset. Pertinent negatives include no numbness, no inability to bear weight, no loss of motion, no loss of sensation and no tingling.  Pt with history of lower extremity cellulitis on left leg. Developed swelling and redness on right lower leg a week ago. States went to his PCP 5 and again 3 days ago. Was given keflex. Pt states pain improved, however, swelling is still there. He states during the day he is on his feet all day, however at night, he has leg propped up on pillows. Denies fever, chills malaise.   Past Medical History  Diagnosis Date  . Stroke   . Seizures     Past Surgical History  Procedure Date  . Renal biopsy     Family History  Problem Relation Age of Onset  . Diabetes Mother   . Diabetes Other     History  Substance Use Topics  . Smoking status: Never Smoker   . Smokeless tobacco: Not on file  . Alcohol Use: 0.6 oz/week    1 Cans of beer per week     occassional      Review of Systems  Constitutional: Negative for fever and chills.  Respiratory: Negative.   Cardiovascular: Positive for leg swelling. Negative for chest pain and palpitations.  Gastrointestinal: Negative.   Musculoskeletal:       Leg pain  Skin: Positive for color change.  Neurological: Negative for dizziness, tingling, weakness, numbness and headaches.    Allergies    Review of patient's allergies indicates no known allergies.  Home Medications   Current Outpatient Rx  Name Route Sig Dispense Refill  . ACETAMINOPHEN 500 MG PO TABS Oral Take 500 mg by mouth every 6 (six) hours as needed. For pain    . DILTIAZEM HCL ER 240 MG PO CP24 Oral Take 240 mg by mouth daily.      Marland Kitchen LAMICTAL 150 MG PO TABS Oral Take 1 tablet by mouth Twice daily.    Marland Kitchen LISINOPRIL-HYDROCHLOROTHIAZIDE 20-12.5 MG PO TABS Oral Take 1 tablet by mouth Daily.    Marland Kitchen MYCOPHENOLATE MOFETIL 500 MG PO TABS Oral Take 1,000 mg by mouth 2 (two) times daily.      . WARFARIN SODIUM 10 MG PO TABS  TAKE ONE TABLET BY MOUTH DAILY OR AS DIRECTED. 30 tablet 2    BP 126/72  Pulse 79  Temp(Src) 97.9 F (36.6 C) (Oral)  Resp 18  SpO2 100%  Physical Exam  Nursing note and vitals reviewed. Constitutional: He is oriented to person, place, and time. He appears well-developed and well-nourished. No distress.  HENT:  Head: Normocephalic.  Eyes: Conjunctivae are normal.  Neck: Thyromegaly present.  Cardiovascular: Normal rate, regular rhythm and normal heart sounds.   Pulmonary/Chest: Effort normal and breath sounds normal. No respiratory distress. He has no wheezes.  Musculoskeletal: He exhibits edema. He  exhibits no tenderness.       Bilateral lower extremity swelling, right worse than left. Right lower leg edematous, no pitting edema. There is some skin hyperpigmentation that appears chronic to the lower  Leg. There is also an area about 6cm in diameter that is red, however not tender, not warm to the touch. Ankle and foot swollen, normal exam otherwise. Normal dorsal pedal pulse. Foot pink and warm.  Neurological: He is alert and oriented to person, place, and time.  Skin: Skin is warm and dry.  Psychiatric: He has a normal mood and affect.    ED Course  Procedures (including critical care time)  Pt with right lower leg swelling. It appears if there was cellulitis it is improving, given pt is  afebrile, there is no tendereness,not warm to touch. Venous doppler performed and negative for DVT. Pt has no systemic symptoms including chest pain, sob. VS all normal .   Filed Vitals:   11/04/11 1430  BP: 126/72  Pulse: 79  Temp: 97.9 F (36.6 C)  Resp: 18   At this time, I believe pt must continue antibiotic, full course, elevate leg at home. Follow up with pcp this week.     1. Swelling of right lower extremity       MDM          Renold Genta, PA 11/05/11 [phone]

## 2011-11-04 NOTE — Discharge Instructions (Signed)
Your doppler study today was negtive for a blood clot. I think your infection is improving. Continue antibiotic until all gone. It is important that you elevate your foot during the day as well to get swelling to go down. Follow up with your doctor this week for recheck. Return if fever, increased redness, or any new concerning symptoms.   Edema Edema is an abnormal build-up of fluids in tissues. Because this is partly dependent on gravity (water flows to the lowest place), it is more common in the leg sand thighs (lower extremities). It is also common in the looser tissues, like around the eyes. Painless swelling of the feet and ankles is common and increases as a person ages. It may affect both legs and may include the calves or even thighs. When squeezed, the fluid may move out of the affected area and may leave a dent for a few moments. CAUSES   Prolonged standing or sitting in one place for extended periods of time. Movement helps pump tissue fluid into the veins, and absence of movement prevents this, resulting in edema.   Varicose veins. The valves in the veins do not work as well as they should. This causes fluid to leak into the tissues.   Fluid and salt overload.   Injury, burn, or surgery to the leg, ankle, or foot, may damage veins and allow fluid to leak out.   Sunburn damages vessels. Leaky vessels allow fluid to go out into the sunburned tissues.   Allergies (from insect bites or stings, medications or chemicals) cause swelling by allowing vessels to become leaky.   Protein in the blood helps keep fluid in your vessels. Low protein, as in malnutrition, allows fluid to leak out.   Hormonal changes, including pregnancy and menstruation, cause fluid retention. This fluid may leak out of vessels and cause edema.   Medications that cause fluid retention. Examples are sex hormones, blood pressure medications, steroid treatment, or anti-depressants.   Some illnesses cause edema,  especially heart failure, kidney disease, or liver disease.   Surgery that cuts veins or lymph nodes, such as surgery done for the heart or for breast cancer, may result in edema.  DIAGNOSIS  Your caregiver is usually easily able to determine what is causing your swelling (edema) by simply asking what is wrong (getting a history) and examining you (doing a physical). Sometimes x-rays, EKG (electrocardiogram or heart tracing), and blood work may be done to evaluate for underlying medical illness. TREATMENT  General treatment includes:  Leg elevation (or elevation of the affected body part).   Restriction of fluid intake.   Prevention of fluid overload.   Compression of the affected body part. Compression with elastic bandages or support stockings squeezes the tissues, preventing fluid from entering and forcing it back into the blood vessels.   Diuretics (also called water pills or fluid pills) pull fluid out of your body in the form of increased urination. These are effective in reducing the swelling, but can have side effects and must be used only under your caregiver's supervision. Diuretics are appropriate only for some types of edema.  The specific treatment can be directed at any underlying causes discovered. Heart, liver, or kidney disease should be treated appropriately. HOME CARE INSTRUCTIONS   Elevate the legs (or affected body part) above the level of the heart, while lying down.   Avoid sitting or standing still for prolonged periods of time.   Avoid putting anything directly under the knees when lying down,  and do not wear constricting clothing or garters on the upper legs.   Exercising the legs causes the fluid to work back into the veins and lymphatic channels. This may help the swelling go down.   The pressure applied by elastic bandages or support stockings can help reduce ankle swelling.   A low-salt diet may help reduce fluid retention and decrease the ankle swelling.     Take any medications exactly as prescribed.  SEEK MEDICAL CARE IF:  Your edema is not responding to recommended treatments. SEEK IMMEDIATE MEDICAL CARE IF:   You develop shortness of breath or chest pain.   You cannot breathe when you lay down; or if, while lying down, you have to get up and go to the window to get your breath.   You are having increasing swelling without relief from treatment.   You develop a fever over 102 F (38.9 C).   You develop pain or redness in the areas that are swollen.   Tell your caregiver right away if you have gained 3 lb/1.4 kg in 1 day or 5 lb/2.3 kg in a week.  MAKE SURE YOU:   Understand these instructions.   Will watch your condition.   Will get help right away if you are not doing well or get worse.  Document Released: 06/04/2005 Document Revised: 05/24/2011 Document Reviewed: 01/21/2008 [facility] Patient Information 2012 [place].

## 2011-11-04 NOTE — ED Notes (Signed)
Venous doppler complete.

## 2011-11-04 NOTE — ED Notes (Signed)
Pt in from home with right leg pain and swelling to the leg states was recently tx for cellulitis states antibiotic was ineffective and swelling has returned

## 2011-11-04 NOTE — ED Notes (Signed)
Pt with non pitting edema to RLE for 3 days. Pt denies injury or "bite" of any kind. PPP=stong. Cap refill prompt. Elevated.

## 2011-11-04 NOTE — Progress Notes (Signed)
VASCULAR LAB PRELIMINARY  PRELIMINARY  PRELIMINARY  PRELIMINARY  Right lower extremity venous duplex completed.    Preliminary report:  Right:  No evidence of DVT, superficial thrombosis, or Baker's cyst.  Judithann Sauger, RVT 11/04/2011, 5:03 PM

## 2011-11-05 NOTE — ED Provider Notes (Signed)
Medical screening examination/treatment/procedure(s) were performed by non-physician practitioner and as supervising physician I was immediately available for consultation/collaboration.  Kathalene Frames, MD 11/05/11 [phone]

## 2011-11-15 ENCOUNTER — Telehealth: Payer: Self-pay | Admitting: Pharmacist

## 2011-11-15 NOTE — Telephone Encounter (Signed)
Called and left vm on patient's home phone advising him that he is overdue for a PT/INR.  His last INR was 08/08/11.  Asked him to call ASAP to schedule an appt.

## 2011-11-30 ENCOUNTER — Telehealth: Payer: Self-pay | Admitting: Pharmacist

## 2011-12-07 ENCOUNTER — Other Ambulatory Visit: Payer: Self-pay | Admitting: Oncology

## 2011-12-07 DIAGNOSIS — Z7901 Long term (current) use of anticoagulants: Secondary | ICD-10-CM

## 2011-12-07 DIAGNOSIS — R894 Abnormal immunological findings in specimens from other organs, systems and tissues: Secondary | ICD-10-CM

## 2011-12-07 DIAGNOSIS — D696 Thrombocytopenia, unspecified: Secondary | ICD-10-CM

## 2011-12-11 ENCOUNTER — Telehealth: Payer: Self-pay | Admitting: *Deleted

## 2011-12-11 NOTE — Telephone Encounter (Signed)
Message left on home number requesting a return call for patient to schedule lab and coumadin clinic f/u with today's coumadin refill request.

## 2012-01-04 ENCOUNTER — Telehealth: Payer: Self-pay | Admitting: Pharmacist

## 2012-01-09 ENCOUNTER — Ambulatory Visit (HOSPITAL_BASED_OUTPATIENT_CLINIC_OR_DEPARTMENT_OTHER): Payer: Managed Care, Other (non HMO) | Admitting: Pharmacist

## 2012-01-09 ENCOUNTER — Other Ambulatory Visit (HOSPITAL_BASED_OUTPATIENT_CLINIC_OR_DEPARTMENT_OTHER): Payer: Managed Care, Other (non HMO)

## 2012-01-09 DIAGNOSIS — R760 Raised antibody titer: Secondary | ICD-10-CM

## 2012-01-09 DIAGNOSIS — R894 Abnormal immunological findings in specimens from other organs, systems and tissues: Secondary | ICD-10-CM

## 2012-01-09 DIAGNOSIS — Z7901 Long term (current) use of anticoagulants: Secondary | ICD-10-CM

## 2012-01-09 LAB — POCT INR
INR: 3.5
INR: 3.5

## 2012-01-09 LAB — PROTIME-INR

## 2012-01-09 MED ORDER — WARFARIN SODIUM 10 MG PO TABS: 10.0000 mg | ORAL_TABLET | Freq: Every day | ORAL | Status: DC

## 2012-01-09 NOTE — Progress Notes (Signed)
Patient already took coumadin dose today.  Take 5mg  x 1 tomorrow (01/10/12).  Then resume 10mg  daily starting 01/11/12.  Return in 1 week on 01/16/12 at 9:45 am for lab and 10am for Clinic.

## 2012-01-09 NOTE — Patient Instructions (Signed)
Take 5mg  x 1 tomorrow (01/10/12) since you already took your coumadin today.  Then resume 10mg  daily starting 01/11/12.  Return in 1 week on 01/16/12 at 9:45 am for lab and 10am for Clinic.

## 2012-01-16 ENCOUNTER — Other Ambulatory Visit: Payer: Managed Care, Other (non HMO) | Admitting: Lab

## 2012-01-16 ENCOUNTER — Ambulatory Visit: Payer: Managed Care, Other (non HMO)

## 2012-01-27 ENCOUNTER — Emergency Department (HOSPITAL_COMMUNITY)
Admission: EM | Admit: 2012-01-27 | Discharge: 2012-01-27 | Disposition: A | Discharge status: Home / Self Care | Payer: Managed Care, Other (non HMO) | Attending: Emergency Medicine | Admitting: Emergency Medicine

## 2012-01-27 ENCOUNTER — Encounter (HOSPITAL_COMMUNITY): Payer: Self-pay | Admitting: Family Medicine

## 2012-01-27 DIAGNOSIS — Z113 Encounter for screening for infections with a predominantly sexual mode of transmission: Secondary | ICD-10-CM | POA: Insufficient documentation

## 2012-01-27 DIAGNOSIS — Z7901 Long term (current) use of anticoagulants: Secondary | ICD-10-CM | POA: Insufficient documentation

## 2012-01-27 DIAGNOSIS — R369 Urethral discharge, unspecified: Secondary | ICD-10-CM | POA: Insufficient documentation

## 2012-01-27 DIAGNOSIS — Z8673 Personal history of transient ischemic attack (TIA), and cerebral infarction without residual deficits: Secondary | ICD-10-CM | POA: Insufficient documentation

## 2012-01-27 DIAGNOSIS — G40909 Epilepsy, unspecified, not intractable, without status epilepticus: Secondary | ICD-10-CM | POA: Insufficient documentation

## 2012-01-27 LAB — URINALYSIS, ROUTINE W REFLEX MICROSCOPIC
Nitrite: NEGATIVE
Protein, ur: 100 mg/dL — AB
Specific Gravity, Urine: 1.02 (ref 1.005–1.030)
Urobilinogen, UA: 0.2 mg/dL (ref 0.0–1.0)
pH: 6 (ref 5.0–8.0)

## 2012-01-27 LAB — URINE MICROSCOPIC-ADD ON

## 2012-01-27 MED ORDER — CEFTRIAXONE SODIUM 1 G IJ SOLR
1.0000 g | Freq: Once | INTRAMUSCULAR | Status: AC
  Administered 2012-01-27: 1 g via INTRAMUSCULAR
  Filled 2012-01-27: qty 10

## 2012-01-27 MED ORDER — AZITHROMYCIN 1 G PO PACK
1.0000 g | PACK | Freq: Once | ORAL | Status: AC
  Administered 2012-01-27: 1 g via ORAL
  Filled 2012-01-27: qty 1

## 2012-01-27 MED ORDER — LIDOCAINE HCL (PF) 1 % IJ SOLN
INTRAMUSCULAR | Status: AC
  Administered 2012-01-27: 5 mL
  Filled 2012-01-27: qty 5

## 2012-01-27 NOTE — ED Provider Notes (Signed)
History     CSN: [id number]  Arrival date & time 01/27/12  [id number]   First MD Initiated Contact with Patient 01/27/12 0840      Chief Complaint  Patient presents with  . Penile Discharge    (Consider location/radiation/quality/duration/timing/severity/associated sxs/prior treatment) HPI Comments: 31 y/o male presents with white penile discharge since yesterday. States it is only a small amount. Admits to unprotected intercourse with 2 different partners within the past 2 months. Denies any penile pain or swelling, testicular pain or swelling, dysuria, hematuria, frequency, fever, chills, rashes, abdominal pain or nausea. Has a history of gonorrhea about 2 years ago and states the discharge was darker then and he also had painful urination with blood. Unsure if partners have hx of STD's.   The history is provided by the patient.    Past Medical History  Diagnosis Date  . Stroke   . Seizures     Past Surgical History  Procedure Date  . Renal biopsy     Family History  Problem Relation Age of Onset  . Diabetes Mother   . Diabetes Other     History  Substance Use Topics  . Smoking status: Never Smoker   . Smokeless tobacco: Not on file  . Alcohol Use: 0.6 oz/week    1 Cans of beer per week     occassional      Review of Systems  Constitutional: Negative for fever and chills.  Gastrointestinal: Negative for nausea and abdominal pain.  Genitourinary: Positive for discharge. Negative for dysuria, frequency, hematuria, penile swelling, scrotal swelling, penile pain and testicular pain.  Skin: Negative for rash.    Allergies  Review of patient's allergies indicates no known allergies.  Home Medications   Current Outpatient Rx  Name Route Sig Dispense Refill  . DILTIAZEM HCL ER 240 MG PO CP24 Oral Take 240 mg by mouth daily.      Marland Kitchen LAMICTAL 150 MG PO TABS Oral Take 1 tablet by mouth Twice daily.    Marland Kitchen LISINOPRIL-HYDROCHLOROTHIAZIDE 20-12.5 MG PO TABS Oral Take 1  tablet by mouth Daily.    Marland Kitchen MYCOPHENOLATE MOFETIL 500 MG PO TABS Oral Take 1,000 mg by mouth 2 (two) times daily.      Marland Kitchen NAPROXEN SODIUM 220 MG PO TABS Oral Take 440 mg by mouth once as needed. Pt is taking 2 Aleve tablets once daily as needed for tooth pain.    . WARFARIN SODIUM 10 MG PO TABS Oral Take 1 tablet (10 mg total) by mouth daily. 30 tablet 2    There were no vitals taken for this visit.  Physical Exam  Nursing note and vitals reviewed. Constitutional: He is oriented to person, place, and time. He appears well-developed and well-nourished. No distress.  HENT:  Head: Normocephalic and atraumatic.  Mouth/Throat: Oropharynx is clear and moist. No oropharyngeal exudate.  Eyes: Conjunctivae are normal.  Cardiovascular: Normal rate, regular rhythm and normal heart sounds.   Pulmonary/Chest: Effort normal and breath sounds normal.  Abdominal: Soft. Bowel sounds are normal. There is no tenderness.  Genitourinary: Testes normal. Right testis shows no swelling and no tenderness. Left testis shows no swelling and no tenderness. Uncircumcised. No penile erythema or penile tenderness. Discharge (yellow) found.  Neurological: He is alert and oriented to person, place, and time.  Skin: Skin is warm and dry. No rash noted.  Psychiatric: He has a normal mood and affect. His behavior is normal.    ED Course  Procedures (including critical care time)  Labs Reviewed  GC/CHLAMYDIA PROBE AMP, URINE  URINALYSIS, ROUTINE W REFLEX MICROSCOPIC   Results for orders placed during the hospital encounter of 01/27/12  URINALYSIS, ROUTINE W REFLEX MICROSCOPIC      Component Value Range   Color, Urine YELLOW  YELLOW   APPearance CLOUDY (*) CLEAR   Specific Gravity, Urine 1.020  1.005 - 1.030   pH 6.0  5.0 - 8.0   Glucose, UA NEGATIVE  NEGATIVE mg/dL   Hgb urine dipstick MODERATE (*) NEGATIVE   Bilirubin Urine NEGATIVE  NEGATIVE   Ketones, ur NEGATIVE  NEGATIVE mg/dL   Protein, ur 100 (*)  NEGATIVE mg/dL   Urobilinogen, UA 0.2  0.0 - 1.0 mg/dL   Nitrite NEGATIVE  NEGATIVE   Leukocytes, UA MODERATE (*) NEGATIVE  URINE MICROSCOPIC-ADD ON      Component Value Range   WBC, UA TOO NUMEROUS TO COUNT  <3 WBC/hpf   RBC / HPF 3-6  <3 RBC/hpf    No results found.   1. Penile discharge       MDM  31 y/o male with yellow discharge. Positive hx of unprotected intercourse with multiple partners. Penile culture obtained. Awaiting UA. Will treat with rocephin and azithromycin.         Illene Labrador, PA-C 01/27/12 1015

## 2012-01-27 NOTE — ED Notes (Signed)
Per pt sts yellow/white discharge since yesterday. Denies pain. sts unprotected sex.

## 2012-01-28 NOTE — ED Provider Notes (Signed)
Medical screening examination/treatment/procedure(s) were performed by non-physician practitioner and as supervising physician I was immediately available for consultation/collaboration.   Chauncy Passy, MD 01/28/12 [phone]

## 2012-01-29 LAB — GC/CHLAMYDIA PROBE AMP, GENITAL: Chlamydia, DNA Probe: NEGATIVE

## 2012-01-30 NOTE — ED Notes (Signed)
+   GC-Patient treated per protocol MD. -[facility] letter faxed.

## 2012-02-04 ENCOUNTER — Telehealth: Payer: Self-pay | Admitting: Pharmacist

## 2012-02-04 NOTE — Telephone Encounter (Signed)
Left VM to schedule coumadin clinic appt.  Last seen in coumadin clinic on 01/09/12.

## 2012-02-26 ENCOUNTER — Ambulatory Visit: Payer: Self-pay | Admitting: Pharmacist

## 2012-04-16 ENCOUNTER — Other Ambulatory Visit (HOSPITAL_BASED_OUTPATIENT_CLINIC_OR_DEPARTMENT_OTHER): Payer: Managed Care, Other (non HMO) | Admitting: Lab

## 2012-04-16 ENCOUNTER — Ambulatory Visit: Payer: Managed Care, Other (non HMO) | Admitting: Pharmacist

## 2012-04-16 DIAGNOSIS — R894 Abnormal immunological findings in specimens from other organs, systems and tissues: Secondary | ICD-10-CM

## 2012-04-16 DIAGNOSIS — Z7901 Long term (current) use of anticoagulants: Secondary | ICD-10-CM

## 2012-04-16 DIAGNOSIS — R760 Raised antibody titer: Secondary | ICD-10-CM

## 2012-04-16 LAB — PROTIME-INR: Protime: 25.2 Seconds — ABNORMAL HIGH (ref 10.6–13.4)

## 2012-04-16 NOTE — Progress Notes (Signed)
INR therapeutic today (2.1) on 10mg  daily. No missed doses or changes in diet or meds.  No problems. Last seen in July, but INR historically variable at this dose.  Will have pt continue Coumadin 10mg  daily, and recheck INR in 3 weeks to ensure he stays in therapeutic range.

## 2012-05-07 ENCOUNTER — Other Ambulatory Visit: Payer: Managed Care, Other (non HMO) | Admitting: Lab

## 2012-05-07 ENCOUNTER — Ambulatory Visit: Payer: Managed Care, Other (non HMO)

## 2012-06-23 ENCOUNTER — Telehealth: Payer: Self-pay | Admitting: Pharmacist

## 2012-06-27 ENCOUNTER — Other Ambulatory Visit (HOSPITAL_BASED_OUTPATIENT_CLINIC_OR_DEPARTMENT_OTHER): Payer: Managed Care, Other (non HMO) | Admitting: Lab

## 2012-06-27 ENCOUNTER — Ambulatory Visit (HOSPITAL_BASED_OUTPATIENT_CLINIC_OR_DEPARTMENT_OTHER): Payer: Managed Care, Other (non HMO) | Admitting: Pharmacist

## 2012-06-27 DIAGNOSIS — R760 Raised antibody titer: Secondary | ICD-10-CM

## 2012-06-27 DIAGNOSIS — R894 Abnormal immunological findings in specimens from other organs, systems and tissues: Secondary | ICD-10-CM

## 2012-06-27 DIAGNOSIS — Z7901 Long term (current) use of anticoagulants: Secondary | ICD-10-CM

## 2012-06-27 LAB — PROTIME-INR: INR: 4.5 — ABNORMAL HIGH (ref 2.00–3.50)

## 2012-06-27 MED ORDER — WARFARIN SODIUM 10 MG PO TABS: 10.0000 mg | ORAL_TABLET | Freq: Every day | ORAL | Status: DC

## 2012-06-27 NOTE — Addendum Note (Signed)
Addended by: Margaret Pyle on: 06/27/2012 11:47 AM   Modules accepted: Orders

## 2012-06-27 NOTE — Progress Notes (Signed)
Pt has not been compliant with cc appts. Presents for appmt today supratherapeutic today at 4.5 and states he took last 10 mg tablet today.  We will hold tomorrows dose and take 5 mg on Sunday.  He will resume 10 mg daily on Monday and return to clinic on Friday, 07/04/12.  I will send new RX to [facility] on [address] 10mg  tablets #30 3 refills.

## 2012-06-27 NOTE — Patient Instructions (Signed)
Pt states he took last 10 mg tablet today.  We will hold tomorrows dose and take 5 mg on Sunday.  He will resume 10 mg daily on Monday and return to clinic on Friday, 07/04/12.

## 2012-07-01 ENCOUNTER — Other Ambulatory Visit: Payer: Self-pay | Admitting: Oncology

## 2012-07-02 ENCOUNTER — Other Ambulatory Visit: Payer: Self-pay | Admitting: Oncology

## 2012-07-03 ENCOUNTER — Other Ambulatory Visit: Payer: Self-pay | Admitting: Oncology

## 2012-07-03 NOTE — Telephone Encounter (Signed)
Per MD oncall

## 2012-07-04 ENCOUNTER — Other Ambulatory Visit: Payer: Managed Care, Other (non HMO) | Admitting: Lab

## 2012-07-04 ENCOUNTER — Ambulatory Visit: Payer: Managed Care, Other (non HMO)

## 2012-07-07 ENCOUNTER — Telehealth: Payer: Self-pay | Admitting: Pharmacist

## 2012-07-07 NOTE — Telephone Encounter (Signed)
Pt did not come to Coumadin Clinic on Fri 07/04/12.  I called pt today & he states he didn't get his Coumadin refill until Fri 07/04/12.  There was no need for him to have his INR since he was off Coumadin for 1 week. He requested appt for 07/16/12. Budd Palmer, Pharm.D.

## 2012-07-16 ENCOUNTER — Ambulatory Visit: Payer: Managed Care, Other (non HMO)

## 2012-07-16 ENCOUNTER — Telehealth: Payer: Self-pay | Admitting: Pharmacist

## 2012-07-16 ENCOUNTER — Encounter: Payer: Self-pay | Admitting: Pharmacist

## 2012-07-16 ENCOUNTER — Other Ambulatory Visit: Payer: Managed Care, Other (non HMO) | Admitting: Lab

## 2012-07-16 NOTE — Progress Notes (Signed)
I contacted Iris Pert, RN for Dr. Truddie Coco to find out if pt had been re-assigned to another MD. She sent email to [facility] to make sure pt was re-assigned. Budd Palmer, Pharm.D.

## 2012-07-25 ENCOUNTER — Telehealth: Payer: Self-pay | Admitting: Pharmacist

## 2012-08-01 ENCOUNTER — Telehealth: Payer: Self-pay | Admitting: Pharmacist

## 2012-08-01 NOTE — Telephone Encounter (Signed)
left vm to call us back and reschedule missed lab/cc appt from 1/29.

## 2012-08-05 ENCOUNTER — Other Ambulatory Visit: Payer: Self-pay | Admitting: Oncology

## 2012-08-05 DIAGNOSIS — R791 Abnormal coagulation profile: Secondary | ICD-10-CM

## 2012-09-09 ENCOUNTER — Telehealth: Payer: Self-pay | Admitting: Pharmacist

## 2012-09-24 ENCOUNTER — Ambulatory Visit: Payer: Managed Care, Other (non HMO)

## 2012-09-24 ENCOUNTER — Ambulatory Visit: Payer: Managed Care, Other (non HMO) | Admitting: Lab

## 2012-09-24 ENCOUNTER — Telehealth: Payer: Self-pay | Admitting: Pharmacist

## 2012-10-20 ENCOUNTER — Telehealth: Payer: Self-pay | Admitting: Pharmacist

## 2012-10-20 ENCOUNTER — Telehealth: Payer: Self-pay | Admitting: Oncology

## 2012-10-20 NOTE — Telephone Encounter (Signed)
Called pt to schedule appt not able to leave message will try back.

## 2012-10-20 NOTE — Telephone Encounter (Signed)
I s/w [name] [name] about this pt as he has no assigned MD for [place] to correspond w/ re: Coumadin clinic. Plan is that pt will be assigned a MD & someone will call him w/ an appt. After pt has seen the newly assigned MD, he can be scheduled for our Coumadin clinic. I called pt & left a voicemail w/ this information & have cancelled his lab/Coumadin clinic visits for tomorrow. Kennith Center, Pharm.D., CPP 10/20/2012@11 :13 AM

## 2012-10-21 ENCOUNTER — Ambulatory Visit: Payer: Managed Care, Other (non HMO)

## 2012-10-21 ENCOUNTER — Other Ambulatory Visit: Payer: Managed Care, Other (non HMO) | Admitting: Lab

## 2012-11-13 ENCOUNTER — Other Ambulatory Visit: Payer: Self-pay | Admitting: Pharmacist

## 2012-11-13 DIAGNOSIS — R791 Abnormal coagulation profile: Secondary | ICD-10-CM

## 2012-11-13 MED ORDER — WARFARIN SODIUM 10 MG PO TABS: ORAL_TABLET | ORAL | Status: DC

## 2012-11-21 ENCOUNTER — Other Ambulatory Visit: Payer: Managed Care, Other (non HMO) | Admitting: Lab

## 2012-11-21 ENCOUNTER — Ambulatory Visit: Payer: Managed Care, Other (non HMO) | Admitting: Oncology

## 2012-11-21 ENCOUNTER — Ambulatory Visit: Payer: Managed Care, Other (non HMO)

## 2012-11-27 ENCOUNTER — Telehealth: Payer: Self-pay | Admitting: Oncology

## 2012-11-28 ENCOUNTER — Ambulatory Visit (HOSPITAL_BASED_OUTPATIENT_CLINIC_OR_DEPARTMENT_OTHER): Payer: Managed Care, Other (non HMO) | Admitting: Pharmacist

## 2012-11-28 ENCOUNTER — Other Ambulatory Visit (HOSPITAL_BASED_OUTPATIENT_CLINIC_OR_DEPARTMENT_OTHER): Payer: Managed Care, Other (non HMO) | Admitting: Lab

## 2012-11-28 DIAGNOSIS — Z7901 Long term (current) use of anticoagulants: Secondary | ICD-10-CM

## 2012-11-28 DIAGNOSIS — R760 Raised antibody titer: Secondary | ICD-10-CM

## 2012-11-28 DIAGNOSIS — R894 Abnormal immunological findings in specimens from other organs, systems and tissues: Secondary | ICD-10-CM

## 2012-11-28 LAB — POCT INR: INR: 1.5

## 2012-11-28 NOTE — Progress Notes (Signed)
INR below goal today.  Mr [name] [name] is only taking lamictal and warfarin at this time b/c we needs to see him PCP to get refills for other medication (diltiazem, lisinopril/hctz and cellcept).  He still needs to schedule appt with PCP.  Mr [name] [name] was not able to make appt last week with Dr. Alen Blew, which is his new assigned hematologist since Dr. Truddie Coco has left.  I will request schedulers contact Mr [name] [name] to reschedule.  Mr [name] [name] has been on coumadin 10mg  daily since 2012.  His INR has been relatively stable on this dose although Mr [name] [name] is noncompliant with his appts.  Will continue coumadin 10mg  daily and have Mr [name] [name] take and extra 5mg  today for total of 15mg .  Will check PT/INR next week.

## 2012-12-01 ENCOUNTER — Telehealth: Payer: Self-pay | Admitting: Oncology

## 2012-12-05 ENCOUNTER — Ambulatory Visit (HOSPITAL_BASED_OUTPATIENT_CLINIC_OR_DEPARTMENT_OTHER): Payer: Managed Care, Other (non HMO) | Admitting: Pharmacist

## 2012-12-05 ENCOUNTER — Other Ambulatory Visit (HOSPITAL_BASED_OUTPATIENT_CLINIC_OR_DEPARTMENT_OTHER): Payer: Managed Care, Other (non HMO) | Admitting: Lab

## 2012-12-05 DIAGNOSIS — R760 Raised antibody titer: Secondary | ICD-10-CM

## 2012-12-05 DIAGNOSIS — Z5181 Encounter for therapeutic drug level monitoring: Secondary | ICD-10-CM

## 2012-12-05 DIAGNOSIS — R894 Abnormal immunological findings in specimens from other organs, systems and tissues: Secondary | ICD-10-CM

## 2012-12-05 DIAGNOSIS — Z7901 Long term (current) use of anticoagulants: Secondary | ICD-10-CM

## 2012-12-05 DIAGNOSIS — R791 Abnormal coagulation profile: Secondary | ICD-10-CM

## 2012-12-05 LAB — PROTIME-INR
INR: 2.1 (ref 2.00–3.50)
Protime: 25.2 Seconds — ABNORMAL HIGH (ref 10.6–13.4)

## 2012-12-05 MED ORDER — WARFARIN SODIUM 10 MG PO TABS: ORAL_TABLET | ORAL | Status: DC

## 2012-12-05 NOTE — Progress Notes (Signed)
Pt seen in clinic today.  INR=2.6 on 10 mg daily Continue same dose. Encouraged pt to make f/u appmt with PCP as he has run out of his BP meds and they will not authorize refills until he is seen in their office. Check PT/INR on 01/14/13 at 10am for lab and coumadin clinic 10:15am.

## 2012-12-05 NOTE — Patient Instructions (Signed)
Continue coumadin 10mg  daily.  Check PT/INR on 01/14/13 at 10am for lab and coumadin clinic 10:15am

## 2012-12-17 ENCOUNTER — Ambulatory Visit: Payer: Managed Care, Other (non HMO) | Admitting: Oncology

## 2012-12-17 ENCOUNTER — Other Ambulatory Visit: Payer: Managed Care, Other (non HMO) | Admitting: Lab

## 2012-12-26 ENCOUNTER — Other Ambulatory Visit (HOSPITAL_BASED_OUTPATIENT_CLINIC_OR_DEPARTMENT_OTHER): Payer: Managed Care, Other (non HMO) | Admitting: Lab

## 2012-12-26 ENCOUNTER — Ambulatory Visit: Payer: Managed Care, Other (non HMO) | Admitting: Lab

## 2012-12-26 ENCOUNTER — Ambulatory Visit (HOSPITAL_BASED_OUTPATIENT_CLINIC_OR_DEPARTMENT_OTHER): Payer: Managed Care, Other (non HMO) | Admitting: Oncology

## 2012-12-26 ENCOUNTER — Telehealth: Payer: Self-pay | Admitting: *Deleted

## 2012-12-26 VITALS — BP 152/96 | HR 98 | Temp 97.2°F | Resp 20 | Wt 302.1 lb

## 2012-12-26 DIAGNOSIS — D696 Thrombocytopenia, unspecified: Secondary | ICD-10-CM

## 2012-12-26 DIAGNOSIS — Z7901 Long term (current) use of anticoagulants: Secondary | ICD-10-CM

## 2012-12-26 NOTE — Progress Notes (Signed)
Hematology and Oncology Follow Up Visit  [name] [name] [id number] 1980-06-21 32 y.o. 12/26/2012 3:13 PM User Centricity, MDCentricity, User, MD   Principle Diagnosis: 32 year old gentleman with antiphospholipid antibody syndrome diagnosed in December of 2003 after he presented with a left brain stroke. He also had fluctuating thrombocytopenia most likely autoimmune in nature.  Current therapy: Chronically anticoagulated with Coumadin to prevent recurrent articular of thrombosis.  Interim History:  [name] [name] presents today for a followup visit. He is a pleasant 32 year old gentleman am seeing for the first time to his history of thrombosis. Most recently he was seen by Dr. Truddie Coco and is here to establish care with me. As mentioned, he's been on Coumadin with therapeutic levels without any major problems. His most recent CVA dates back to 2005 where he was noncompliant with his Coumadin but have not had any recent problems. Had not had any bleeding complications. He has not had any thrombosis or hospitalizations. He was evaluated by his dentist recently and there is contemplation about dental surgery which I advised against while on Coumadin due to his increased risk of bleeding. Currently, he is not reporting any dental problems and his teeth feel fine without any complications.  Medications: I have reviewed the patient's current medications.  Current Outpatient Prescriptions  Medication Sig Dispense Refill  . diltiazem (DILACOR XR) 240 MG 24 hr capsule Take 240 mg by mouth daily.        Marland Kitchen LAMICTAL 150 MG tablet Take 150 mg by mouth Twice daily.       Marland Kitchen lisinopril-hydrochlorothiazide (PRINZIDE,ZESTORETIC) 20-12.5 MG per tablet Take 1 tablet by mouth Daily.      . mycophenolate (CELLCEPT) 500 MG tablet Take 1,000 mg by mouth 2 (two) times daily.        . naproxen sodium (ANAPROX) 220 MG tablet Take 440 mg by mouth 2 (two) times daily as needed. For pain      . warfarin (COUMADIN) 10 MG tablet TAKE  ONE TABLET BY MOUTH EVERY DAY OR AS DIRECTED  30 tablet  2   No current facility-administered medications for this visit.     Allergies: No Known Allergies  Past Medical History, Surgical history, Social history, and Family History were reviewed and updated.  Review of Systems: Constitutional:  Negative for fever, chills, night sweats, anorexia, weight loss, pain. Cardiovascular: no chest pain or dyspnea on exertion Respiratory: negative Neurological: negative Dermatological: negative ENT: negative Skin: Negative. Gastrointestinal: negative Genito-Urinary: negative Hematological and Lymphatic: negative Breast: negative Musculoskeletal: negative Remaining ROS negative. Physical Exam: Blood pressure 152/96, pulse 98, temperature 97.2 F (36.2 C), temperature source Oral, resp. rate 20, weight 302 lb 2 oz (137.043 kg). ECOG: 0 General appearance: alert Head: Normocephalic, without obvious abnormality, atraumatic Neck: no adenopathy, no carotid bruit, no JVD, supple, symmetrical, trachea midline and thyroid not enlarged, symmetric, no tenderness/mass/nodules Lymph nodes: Cervical, supraclavicular, and axillary nodes normal. Heart:regular rate and rhythm, S1, S2 normal, no murmur, click, rub or gallop Lung:chest clear, no wheezing, rales, normal symmetric air entry Abdomin: soft, non-tender, without masses or organomegaly EXT:no erythema, induration, or nodules   Lab Results: Lab Results  Component Value Date   WBC 6.6 08/18/2011   HGB 14.9 08/18/2011   HCT 43.9 08/18/2011   MCV 86.8 08/18/2011   PLT 126* 08/18/2011     Chemistry      Component Value Date/Time   NA 139 08/18/2011 0530   K 4.4 08/18/2011 0530   CL 105 08/18/2011 0530   CO2 29 08/18/2011  0530   BUN 23 08/18/2011 0530   CREATININE 1.83* 08/18/2011 0530      Component Value Date/Time   CALCIUM 8.9 08/18/2011 0530   ALKPHOS 86 06/09/2010 0708   AST 29 06/09/2010 0708   ALT 19 06/09/2010 0708   BILITOT 0.5 06/09/2010 0708        Impression and Plan:  32 year old gentleman with the following issues:  1. Antiphospholipid antibody syndrome with recurrent arterial thrombosis including CVA in 2003 and 2005. He's been chronically anticoagulated with Coumadin without any major complications. He is more compliant now and have been doing a good job of keeping up with his appointments and a long as his therapeutic on Coumadin seems to have been very well protected at this time. I see no reason to change his anticoagulation as pounds.  2. Thrombocytopenia: His most recent blood counts of then close to normal but that was over a year ago. I will repeat a CBC on him today to make sure his blood counts are adequate at this time. He does have relapse thrombocytopenia immunosuppressive agents would be needed at that time.  3. Anticipated dental surgery: It appears that the need for dental surgery subsided at this point. Is not reporting that any oral pain or bleeding but encased and that is planned in the future he asked to come off Coumadin given the increased risk of bleeding. He understands, off Coumadin me his risk of recurrent CVA for that reason I favor not to do so unless absolutely needed.  He will followup in 6 months time sooner if there is any question or problem.  [facility], MD 7/11/20143:13 PM

## 2012-12-26 NOTE — Telephone Encounter (Signed)
appts made and printed...td 

## 2013-01-07 ENCOUNTER — Telehealth: Payer: Self-pay | Admitting: *Deleted

## 2013-01-07 NOTE — Telephone Encounter (Signed)
rec'd call from dr Kingsley Plan DDS's office. They would like for patient to come off of coumadin x 1 week prior to 5-6 teeth extractions and 26 other teeth, having deep cleaning under the gum. Note to dr Hazeline Junker desk.

## 2013-01-07 NOTE — Telephone Encounter (Signed)
Per dr Alen Blew, not okay to come off of coumadin, okay to clean teeth, but not to do extractions. Faxed to [facility] @ dr fee's [phone]

## 2013-01-14 ENCOUNTER — Other Ambulatory Visit: Payer: Managed Care, Other (non HMO) | Admitting: Lab

## 2013-01-14 ENCOUNTER — Ambulatory Visit: Payer: Managed Care, Other (non HMO)

## 2013-02-13 ENCOUNTER — Emergency Department (HOSPITAL_COMMUNITY)
Admission: EM | Admit: 2013-02-13 | Discharge: 2013-02-13 | Disposition: A | Discharge status: Home / Self Care | Payer: 59 | Attending: Emergency Medicine | Admitting: Emergency Medicine

## 2013-02-13 ENCOUNTER — Emergency Department (HOSPITAL_COMMUNITY): Payer: 59

## 2013-02-13 ENCOUNTER — Encounter (HOSPITAL_COMMUNITY): Payer: Self-pay | Admitting: Emergency Medicine

## 2013-02-13 DIAGNOSIS — Z8673 Personal history of transient ischemic attack (TIA), and cerebral infarction without residual deficits: Secondary | ICD-10-CM | POA: Insufficient documentation

## 2013-02-13 DIAGNOSIS — G40909 Epilepsy, unspecified, not intractable, without status epilepticus: Secondary | ICD-10-CM

## 2013-02-13 DIAGNOSIS — Z7901 Long term (current) use of anticoagulants: Secondary | ICD-10-CM | POA: Insufficient documentation

## 2013-02-13 DIAGNOSIS — K1370 Unspecified lesions of oral mucosa: Secondary | ICD-10-CM | POA: Insufficient documentation

## 2013-02-13 DIAGNOSIS — Z79899 Other long term (current) drug therapy: Secondary | ICD-10-CM | POA: Insufficient documentation

## 2013-02-13 DIAGNOSIS — R569 Unspecified convulsions: Secondary | ICD-10-CM

## 2013-02-13 DIAGNOSIS — R404 Transient alteration of awareness: Secondary | ICD-10-CM | POA: Insufficient documentation

## 2013-02-13 DIAGNOSIS — R259 Unspecified abnormal involuntary movements: Secondary | ICD-10-CM | POA: Insufficient documentation

## 2013-02-13 MED ORDER — LAMOTRIGINE 150 MG PO TABS
150.0000 mg | ORAL_TABLET | Freq: Once | ORAL | Status: AC
  Administered 2013-02-13: 150 mg via ORAL
  Filled 2013-02-13: qty 1

## 2013-02-13 NOTE — ED Provider Notes (Signed)
CSN: [id number]     Arrival date & time 02/13/13  1733 History   First MD Initiated Contact with Patient 02/13/13 1739     Chief Complaint  Patient presents with  . Seizures   (Consider location/radiation/quality/duration/timing/severity/associated sxs/prior Treatment) Patient is a 32 y.o. male presenting with seizures. The history is provided by the patient.  Seizures Seizure activity on arrival: no   Seizure type:  Grand mal Preceding symptoms: no dizziness, no hyperventilation and no nausea   Initial focality:  None Episode characteristics: generalized shaking, tongue biting and unresponsiveness   Episode characteristics: no incontinence   Postictal symptoms: confusion   Return to baseline: yes   Severity:  Mild Timing:  Once Number of seizures this episode:  1 Progression:  Resolved Context: medical non-compliance (off his lamotrigine for 2 days)   Context: not drug use, not fever and not flashing visual stimuli   Recent head injury:  No recent head injuries PTA treatment:  None History of seizures: yes   Date of initial seizure episode:  5 years ago Date of most recent prior episode:  Over 10 months ago Seizure control level:  Well controlled Current therapy:  Lamotrigine   Past Medical History  Diagnosis Date  . Stroke   . Seizures    Past Surgical History  Procedure Laterality Date  . Renal biopsy     Family History  Problem Relation Age of Onset  . Diabetes Mother   . Diabetes Other    History  Substance Use Topics  . Smoking status: Never Smoker   . Smokeless tobacco: Not on file  . Alcohol Use: 0.6 oz/week    1 Cans of beer per week     Comment: occassional    Review of Systems  Constitutional: Negative for fever.  Respiratory: Negative for cough and shortness of breath.   Neurological: Positive for seizures.  All other systems reviewed and are negative.    Allergies  Review of patient's allergies indicates no known allergies.  Home  Medications   Current Outpatient Rx  Name  Route  Sig  Dispense  Refill  . LAMICTAL 150 MG tablet   Oral   Take 150 mg by mouth Twice daily.          Marland Kitchen lisinopril-hydrochlorothiazide (PRINZIDE,ZESTORETIC) 10-12.5 MG per tablet   Oral   Take 1 tablet by mouth daily.         Marland Kitchen warfarin (COUMADIN) 10 MG tablet   Oral   Take 10 mg by mouth daily.          BP 141/84  Pulse 93  Temp(Src) 98.7 F (37.1 C) (Oral)  Resp 20  SpO2 98% Physical Exam  Constitutional: He is oriented to person, place, and time. He appears well-developed and well-nourished. No distress.  HENT:  Head: Normocephalic and atraumatic.    Mouth/Throat: No oropharyngeal exudate.  Eyes: EOM are normal. Pupils are equal, round, and reactive to light.  Neck: Normal range of motion. Neck supple.  Cardiovascular: Normal rate and regular rhythm.  Exam reveals no friction rub.   No murmur heard. Pulmonary/Chest: Effort normal and breath sounds normal. No respiratory distress. He has no wheezes. He has no rales.  Abdominal: He exhibits no distension. There is no tenderness. There is no rebound.  Musculoskeletal: Normal range of motion. He exhibits no edema.  Neurological: He is alert and oriented to person, place, and time.  Skin: He is not diaphoretic.    ED Course  Procedures (including critical  care time) Labs Review Labs Reviewed - No data to display Imaging Review Dg Shoulder Right  02/13/2013   *RADIOLOGY REPORT*  Clinical Data: Seizure.  Right shoulder pain.  RIGHT SHOULDER - 2+ VIEW  Comparison: None.  Findings: Right shoulder is located.  Type 2 acromion.  Normal AC joint.  Glenohumeral joint appears normal.  No fracture. Visualized right chest unremarkable.  IMPRESSION: Normal radiographs of the right shoulder.   Original Report Authenticated By: Dereck Ligas, M.D.   Ct Maxillofacial Wo Cm  02/13/2013   CLINICAL DATA:  Seizure. Right orbital swelling.  EXAM: CT MAXILLOFACIAL WITHOUT CONTRAST   TECHNIQUE: Multidetector CT imaging of the maxillofacial structures was performed. Multiplanar CT image reconstructions were also generated. A small metallic BB was placed on the right temple in order to reliably differentiate right from left.  COMPARISON:  None.  FINDINGS: No acute bony abnormality. No evidence of facial or orbital fracture. Mandible and zygomatic arches are intact. There is slight mucosal thickening within the paranasal sinuses. Orbital soft tissues are unremarkable.  IMPRESSION: No evidence of facial or orbital fracture.   Electronically Signed   By: Rolm Baptise   On: 02/13/2013 18:52    MDM   1. Seizure disorder   2. Seizure     32 year old male with history of seizures currently on lamotrigine presents with a seizure. Patient is in his car, however he was parked at the bank. He did not hit anyone or run into traffic. He had a witnessed generalized tonic-clonic seizure today. Associated tongue biting. No urinary incontinence. He has missed his lamotrigine therapy the past 2 days. He also reports lack of sleep for the past few days. He is returned to baseline here. He is doing well and is resting comfortably. He had one O2 sat of 88 and was put on oxygen, however I turned them off when I entered the room and he stayed in the mid 90s entire time. Patient has a normal neuro exam. He is intact. He has a small right brow ridge deformity with tenderness. He might have injured himself during his seizure. I will CT his face. I will also give him his lamotrigine. I will also x-rays right shoulder. CT face negative. Xray shoulder normal. Patient stable for discharge. He Has lamotrigine waiting at the pharmacy.  I have reviewed all labs and imaging and considered them in my medical decision making.   Osvaldo Shipper, MD 02/13/13 2107

## 2013-02-13 NOTE — ED Notes (Signed)
Bed: [id number] Expected date:  Expected time:  Means of arrival:  Comments: ems seizure

## 2013-02-13 NOTE — ED Notes (Signed)
Per ems while at a bank pt had a Sz witnessed by bystanders pt was in his car  ems arrived they notedblood from his mouth he bite his tongue pt stated that he ran out of his Lamotrigine 150mg  2 days ago and that he just pick up his meds today on arrival aox4

## 2013-05-09 ENCOUNTER — Encounter (HOSPITAL_COMMUNITY): Payer: Self-pay | Admitting: Emergency Medicine

## 2013-05-09 ENCOUNTER — Emergency Department (HOSPITAL_COMMUNITY)
Admission: EM | Admit: 2013-05-09 | Discharge: 2013-05-09 | Disposition: A | Discharge status: Home / Self Care | Payer: 59 | Attending: Emergency Medicine | Admitting: Emergency Medicine

## 2013-05-09 DIAGNOSIS — Z79899 Other long term (current) drug therapy: Secondary | ICD-10-CM | POA: Insufficient documentation

## 2013-05-09 DIAGNOSIS — R369 Urethral discharge, unspecified: Secondary | ICD-10-CM | POA: Insufficient documentation

## 2013-05-09 DIAGNOSIS — Z8673 Personal history of transient ischemic attack (TIA), and cerebral infarction without residual deficits: Secondary | ICD-10-CM | POA: Insufficient documentation

## 2013-05-09 DIAGNOSIS — Z8739 Personal history of other diseases of the musculoskeletal system and connective tissue: Secondary | ICD-10-CM | POA: Insufficient documentation

## 2013-05-09 DIAGNOSIS — Z7901 Long term (current) use of anticoagulants: Secondary | ICD-10-CM | POA: Insufficient documentation

## 2013-05-09 DIAGNOSIS — G40909 Epilepsy, unspecified, not intractable, without status epilepticus: Secondary | ICD-10-CM | POA: Insufficient documentation

## 2013-05-09 DIAGNOSIS — Z87891 Personal history of nicotine dependence: Secondary | ICD-10-CM | POA: Insufficient documentation

## 2013-05-09 HISTORY — DX: Reserved for concepts with insufficient information to code with codable children: IMO0002

## 2013-05-09 HISTORY — DX: Systemic lupus erythematosus, unspecified: M32.9

## 2013-05-09 LAB — URINALYSIS, ROUTINE W REFLEX MICROSCOPIC
Bilirubin Urine: NEGATIVE
Glucose, UA: NEGATIVE mg/dL
Ketones, ur: NEGATIVE mg/dL
Nitrite: NEGATIVE
Protein, ur: >300 mg/dL — AB
Specific Gravity, Urine: 1.019 (ref 1.005–1.030)
Urobilinogen, UA: 0.2 mg/dL (ref 0.0–1.0)
pH: 6 (ref 5.0–8.0)

## 2013-05-09 MED ORDER — CEFTRIAXONE SODIUM 250 MG IJ SOLR
250.0000 mg | Freq: Once | INTRAMUSCULAR | Status: AC
  Administered 2013-05-09: 250 mg via INTRAMUSCULAR
  Filled 2013-05-09: qty 250

## 2013-05-09 MED ORDER — AZITHROMYCIN 250 MG PO TABS
1000.0000 mg | ORAL_TABLET | Freq: Once | ORAL | Status: AC
  Administered 2013-05-09: 1000 mg via ORAL
  Filled 2013-05-09: qty 4

## 2013-05-09 MED ORDER — LIDOCAINE HCL 1 % IJ SOLN
INTRAMUSCULAR | Status: AC
  Filled 2013-05-09: qty 20

## 2013-05-09 NOTE — ED Notes (Addendum)
Pt states he has had some discomfort in the head of his penis - it felt irritated when it rubbed against his clothing yesterday and today he states he believes he saw spots of blood on his underwear.  Pt denies itching and discharge.  Pt has hx of gonorrhea.

## 2013-05-09 NOTE — ED Notes (Signed)
Believes he has gonorrhea

## 2013-05-09 NOTE — ED Provider Notes (Signed)
CSN: [id number]     Arrival date & time 05/09/13  1800 History   First MD Initiated Contact with Patient 05/09/13 1830     Chief Complaint  Patient presents with  . penile discomfort    (Consider location/radiation/quality/duration/timing/severity/associated sxs/prior Treatment) HPI Comments: Patient presents to the emergency department with chief complaint of penile discharge. He states that he noticed some discharge today, and that it felt irritated yesterday. He denies any dysuria. Denies fevers, chills, nausea, or vomiting. Denies any pain in his testicles. He states that he is concerned that he has gonorrhea. He has had unprotected sex with one partner. He states that he has had gonorrhea before, and this feels similar.  The history is provided by the patient. No language interpreter was used.    Past Medical History  Diagnosis Date  . Stroke   . Seizures   . Seizures   . Stroke     32 or 32 years of age.    . Lupus    Past Surgical History  Procedure Laterality Date  . Renal biopsy     Family History  Problem Relation Age of Onset  . Diabetes Mother   . Diabetes Other    History  Substance Use Topics  . Smoking status: Former Research scientist (life sciences)  . Smokeless tobacco: Never Used  . Alcohol Use: 0.6 oz/week    1 Cans of beer per week     Comment: occassional    Review of Systems  All other systems reviewed and are negative.    Allergies  Review of patient's allergies indicates no known allergies.  Home Medications   Current Outpatient Rx  Name  Route  Sig  Dispense  Refill  . LAMICTAL 150 MG tablet   Oral   Take 150 mg by mouth Twice daily.          Marland Kitchen lisinopril-hydrochlorothiazide (PRINZIDE,ZESTORETIC) 10-12.5 MG per tablet   Oral   Take 1 tablet by mouth daily.         Marland Kitchen warfarin (COUMADIN) 10 MG tablet   Oral   Take 10 mg by mouth daily.          BP 176/113  Pulse 96  Temp(Src) 98.5 F (36.9 C) (Oral)  Resp 16  SpO2 99% Physical Exam  Nursing  note and vitals reviewed. Constitutional: He is oriented to person, place, and time. He appears well-developed and well-nourished.  HENT:  Head: Normocephalic and atraumatic.  Eyes: Conjunctivae and EOM are normal.  Neck: Normal range of motion.  Cardiovascular: Normal rate.   Pulmonary/Chest: Effort normal.  Abdominal: He exhibits no distension.  Genitourinary:  Uncircumcised male, no obvious discharge, no lesions, masses, or bumps on penile shaft or testicles  Musculoskeletal: Normal range of motion.  Neurological: He is alert and oriented to person, place, and time.  Skin: Skin is dry.  Psychiatric: He has a normal mood and affect. His behavior is normal. Judgment and thought content normal.    ED Course  Procedures (including critical care time) Results for orders placed during the hospital encounter of 05/09/13  URINALYSIS, ROUTINE W REFLEX MICROSCOPIC      Result Value Range   Color, Urine YELLOW  YELLOW   APPearance CLEAR  CLEAR   Specific Gravity, Urine 1.019  1.005 - 1.030   pH 6.0  5.0 - 8.0   Glucose, UA NEGATIVE  NEGATIVE mg/dL   Hgb urine dipstick MODERATE (*) NEGATIVE   Bilirubin Urine NEGATIVE  NEGATIVE   Ketones, ur NEGATIVE  NEGATIVE mg/dL   Protein, ur >300 (*) NEGATIVE mg/dL   Urobilinogen, UA 0.2  0.0 - 1.0 mg/dL   Nitrite NEGATIVE  NEGATIVE   Leukocytes, UA SMALL (*) NEGATIVE  URINE MICROSCOPIC-ADD ON      Result Value Range   WBC, UA 21-50  <3 WBC/hpf   RBC / HPF 3-6  <3 RBC/hpf   No results found.  Imaging Review No results found.  EKG Interpretation   None       MDM   1. Penile discharge     Patient with penile discharge. Will check urinalysis, and GC.  Will treat with Rocephin and azithromycin. No sex for 10 days. Will notify patient of test results.    Montine Circle, PA-C 05/09/13 2011

## 2013-05-11 NOTE — ED Provider Notes (Signed)
Medical screening examination/treatment/procedure(s) were conducted as a shared visit with non-physician practitioner(s) and myself.  I personally evaluated the patient during the encounter.  EKG Interpretation   None       Pt c/p penile irritation?d/c. No abd or flank pain. abd soft nt. Ua. Urethral swab rx.  Mirna Mires, MD 05/11/13 [phone]

## 2013-05-12 LAB — GC/CHLAMYDIA PROBE AMP
CT Probe RNA: NEGATIVE
GC Probe RNA: NEGATIVE

## 2013-06-09 ENCOUNTER — Telehealth: Payer: Self-pay | Admitting: *Deleted

## 2013-06-09 NOTE — Telephone Encounter (Signed)
Attempted several times to call [facility] @ Dr. Marval Regal [facility] ) unsuccessfully.  Left message on voice mail for [facility] to call nurse back. Per Dr. Alen Blew,  Pt has not been seen in office for a while.  Pt has office visit appt on 06/30/13.  Dr. Alen Blew will make decision after seeing pt about labs.  Until pt is seen in our office, Dr. Alen Blew will not be monitoring INR. Nurse noted pt did not come for lab and coumadin clinic appts on 01/14/13, and pt did not call to reschedule appts.

## 2013-06-09 NOTE — Telephone Encounter (Signed)
Received call from [name] @ Dr. Marval Regal ( [name] ) wanting to know who will be monitoring INR labs since pt is on Coumadin.  Per [name], pt used to go to [facility] Clinic, but has not been in a long time. [name]  Phone   [phone] ext  [phone].

## 2013-06-21 ENCOUNTER — Other Ambulatory Visit: Payer: Self-pay | Admitting: Oncology

## 2013-06-29 ENCOUNTER — Telehealth: Payer: Self-pay | Admitting: Oncology

## 2013-06-29 NOTE — Telephone Encounter (Signed)
returned pt call and lvm confirming appt °

## 2013-06-30 ENCOUNTER — Other Ambulatory Visit: Payer: 59

## 2013-06-30 ENCOUNTER — Ambulatory Visit: Payer: Managed Care, Other (non HMO) | Admitting: Oncology

## 2013-06-30 ENCOUNTER — Other Ambulatory Visit: Payer: Managed Care, Other (non HMO)

## 2013-06-30 ENCOUNTER — Ambulatory Visit: Payer: 59

## 2013-07-01 ENCOUNTER — Encounter (INDEPENDENT_AMBULATORY_CARE_PROVIDER_SITE_OTHER): Payer: Self-pay

## 2013-07-01 ENCOUNTER — Ambulatory Visit (HOSPITAL_BASED_OUTPATIENT_CLINIC_OR_DEPARTMENT_OTHER): Payer: Self-pay | Admitting: Pharmacist

## 2013-07-01 ENCOUNTER — Other Ambulatory Visit (HOSPITAL_BASED_OUTPATIENT_CLINIC_OR_DEPARTMENT_OTHER): Payer: 59

## 2013-07-01 DIAGNOSIS — D696 Thrombocytopenia, unspecified: Secondary | ICD-10-CM

## 2013-07-01 DIAGNOSIS — Z7901 Long term (current) use of anticoagulants: Secondary | ICD-10-CM

## 2013-07-01 DIAGNOSIS — R760 Raised antibody titer: Secondary | ICD-10-CM

## 2013-07-01 DIAGNOSIS — R894 Abnormal immunological findings in specimens from other organs, systems and tissues: Secondary | ICD-10-CM

## 2013-07-01 LAB — POCT INR: INR: 2.2

## 2013-07-01 LAB — CBC WITH DIFFERENTIAL/PLATELET
BASO%: 0.3 % (ref 0.0–2.0)
Basophils Absolute: 0 10*3/uL (ref 0.0–0.1)
EOS%: 4.9 % (ref 0.0–7.0)
Eosinophils Absolute: 0.3 10*3/uL (ref 0.0–0.5)
HEMATOCRIT: 47.4 % (ref 38.4–49.9)
HGB: 15.9 g/dL (ref 13.0–17.1)
LYMPH#: 1.8 10*3/uL (ref 0.9–3.3)
LYMPH%: 27.6 % (ref 14.0–49.0)
MCH: 29.6 pg (ref 27.2–33.4)
MCHC: 33.5 g/dL (ref 32.0–36.0)
MCV: 88.1 fL (ref 79.3–98.0)
MONO#: 0.5 10*3/uL (ref 0.1–0.9)
MONO%: 8.2 % (ref 0.0–14.0)
NEUT#: 3.8 10*3/uL (ref 1.5–6.5)
NEUT%: 59 % (ref 39.0–75.0)
Platelets: 122 10*3/uL — ABNORMAL LOW (ref 140–400)
RBC: 5.38 10*6/uL (ref 4.20–5.82)
RDW: 13 % (ref 11.0–14.6)
WBC: 6.4 10*3/uL (ref 4.0–10.3)

## 2013-07-01 LAB — PROTIME-INR
INR: 2.2 (ref 2.00–3.50)
Protime: 26.4 Seconds — ABNORMAL HIGH (ref 10.6–13.4)

## 2013-07-01 MED ORDER — WARFARIN SODIUM 10 MG PO TABS: ORAL_TABLET | ORAL | Status: DC

## 2013-07-01 NOTE — Patient Instructions (Signed)
INR at goal No changes Continue coumadin 10mg  daily.   Check PT/INR on 07/29/13 at noon for lab and coumadin clinic 12:15pm.

## 2013-07-01 NOTE — Progress Notes (Signed)
INR at goal Pt is doing well but is getting over a cold No complaints or issues regarding anticoagulation. No unusual bleeding or bruising No diet or medication changes No missed or extra doses Patient requests coumadin refills, refill request sent to [facility] for patient Pt was scheduled to see Dr. Alen Blew yesterday but pt had to cancel, pt plans to stop by scheduling on his way out today to reschedule this appointment.  Only issue patient states is that he plans to have a tooth pulled in the near future and will let us and Dr. Alen Blew know his plans Plan for now: No changes Continue coumadin 10mg  daily.   Check PT/INR in one month on 07/29/13 at noon for lab and coumadin clinic 12:15pm.

## 2013-07-13 ENCOUNTER — Telehealth: Payer: Self-pay | Admitting: Pharmacist

## 2013-07-13 NOTE — Telephone Encounter (Signed)
Pt called to r/s CC appmt to Fri 1/30 at 2:30

## 2013-07-17 ENCOUNTER — Other Ambulatory Visit: Payer: 59

## 2013-07-17 ENCOUNTER — Ambulatory Visit: Payer: 59

## 2013-07-29 ENCOUNTER — Other Ambulatory Visit (HOSPITAL_BASED_OUTPATIENT_CLINIC_OR_DEPARTMENT_OTHER): Payer: 59

## 2013-07-29 ENCOUNTER — Ambulatory Visit (HOSPITAL_BASED_OUTPATIENT_CLINIC_OR_DEPARTMENT_OTHER): Payer: 59 | Admitting: Pharmacist

## 2013-07-29 ENCOUNTER — Encounter (INDEPENDENT_AMBULATORY_CARE_PROVIDER_SITE_OTHER): Payer: Self-pay

## 2013-07-29 DIAGNOSIS — Z7901 Long term (current) use of anticoagulants: Secondary | ICD-10-CM

## 2013-07-29 DIAGNOSIS — R760 Raised antibody titer: Secondary | ICD-10-CM

## 2013-07-29 DIAGNOSIS — R894 Abnormal immunological findings in specimens from other organs, systems and tissues: Secondary | ICD-10-CM

## 2013-07-29 LAB — PROTIME-INR
INR: 3.1 (ref 2.00–3.50)
Protime: 37.2 Seconds — ABNORMAL HIGH (ref 10.6–13.4)

## 2013-07-29 LAB — POCT INR: INR: 3.1

## 2013-07-29 NOTE — Progress Notes (Signed)
INR essentially at goal today. No changes in medications. No missed coumadin doses. Pt has been eating more [employer] lately. No concerns regarding anticoagulation. Pt doing well. Pt would like to have a tooth pulled at some point in the future. He will let us know if his tooth becomes painful and there are plans for dental surgery. He would also like to have his teeth cleaned. From our standpoint, pt does not need to come off coumadin for dental cleaning. Continue coumadin 10mg  daily.   Check PT/INR on 08/26/13: noon for lab and coumadin clinic at 12:15pm. Provided note to patient to excuse him from work - stating pt was at a medical visit.

## 2013-07-29 NOTE — Patient Instructions (Signed)
Continue coumadin 10mg  daily.   Check PT/INR on 08/26/13: noon for lab and coumadin clinic at 12:15pm.

## 2013-08-26 ENCOUNTER — Other Ambulatory Visit: Payer: 59

## 2013-08-26 ENCOUNTER — Telehealth: Payer: Self-pay | Admitting: Pharmacist

## 2013-08-26 ENCOUNTER — Ambulatory Visit: Payer: 59

## 2013-08-26 NOTE — Telephone Encounter (Addendum)
Asked pt to call and R/S missed lab/CC visit from 08/26/13 Please call [phone] to reschedule.

## 2013-09-21 ENCOUNTER — Other Ambulatory Visit: Payer: 59

## 2013-09-21 ENCOUNTER — Telehealth: Payer: Self-pay | Admitting: Pharmacist

## 2013-09-21 ENCOUNTER — Ambulatory Visit: Payer: 59

## 2013-09-23 ENCOUNTER — Other Ambulatory Visit (HOSPITAL_BASED_OUTPATIENT_CLINIC_OR_DEPARTMENT_OTHER): Payer: 59

## 2013-09-23 ENCOUNTER — Ambulatory Visit (HOSPITAL_BASED_OUTPATIENT_CLINIC_OR_DEPARTMENT_OTHER): Payer: 59 | Admitting: Pharmacist

## 2013-09-23 DIAGNOSIS — R760 Raised antibody titer: Secondary | ICD-10-CM

## 2013-09-23 DIAGNOSIS — R894 Abnormal immunological findings in specimens from other organs, systems and tissues: Secondary | ICD-10-CM

## 2013-09-23 DIAGNOSIS — Z7901 Long term (current) use of anticoagulants: Secondary | ICD-10-CM

## 2013-09-23 LAB — PROTIME-INR
INR: 1.5 — ABNORMAL LOW (ref 2.00–3.50)
Protime: 18 Seconds — ABNORMAL HIGH (ref 10.6–13.4)

## 2013-09-23 LAB — POCT INR: INR: 1.5

## 2013-09-23 NOTE — Patient Instructions (Signed)
INR below goal Take extra 1/2 tablet today to equal 15 mg.  Then Continue coumadin 10mg  daily.   Check PT/INR on 10/14/13: 2:30pm for lab and 2:45pm for coumadin clinic. Remember to take your coumadin every day!

## 2013-09-23 NOTE — Progress Notes (Signed)
INR below goal Pt is doing well with no complaints. He is still planning to have a tooth pulled in the near future. He will coordinate this with [place] and his dentist. Pt reports 2 missed doses this week No unusual bleeding or bruising No signs/symptoms of clotting No medication or diet changes Reinforced importance of compliance and taking his coumadin everyday  Plan: Take extra 1/2 tablet today to equal 15 mg.  Then Continue coumadin 10mg  daily.   Check PT/INR on 10/14/13: 2:30pm for lab and 2:45pm for coumadin clinic.

## 2013-10-14 ENCOUNTER — Ambulatory Visit (HOSPITAL_BASED_OUTPATIENT_CLINIC_OR_DEPARTMENT_OTHER): Payer: 59 | Admitting: Pharmacist

## 2013-10-14 ENCOUNTER — Other Ambulatory Visit (HOSPITAL_BASED_OUTPATIENT_CLINIC_OR_DEPARTMENT_OTHER): Payer: 59

## 2013-10-14 DIAGNOSIS — R894 Abnormal immunological findings in specimens from other organs, systems and tissues: Secondary | ICD-10-CM

## 2013-10-14 DIAGNOSIS — Z7901 Long term (current) use of anticoagulants: Secondary | ICD-10-CM

## 2013-10-14 DIAGNOSIS — R760 Raised antibody titer: Secondary | ICD-10-CM

## 2013-10-14 LAB — CBC WITH DIFFERENTIAL/PLATELET
BASO%: 0.3 % (ref 0.0–2.0)
Basophils Absolute: 0 10*3/uL (ref 0.0–0.1)
EOS ABS: 0.2 10*3/uL (ref 0.0–0.5)
EOS%: 2.5 % (ref 0.0–7.0)
HCT: 45.6 % (ref 38.4–49.9)
HGB: 15.5 g/dL (ref 13.0–17.1)
LYMPH%: 21.3 % (ref 14.0–49.0)
MCH: 29.6 pg (ref 27.2–33.4)
MCHC: 34 g/dL (ref 32.0–36.0)
MCV: 87.2 fL (ref 79.3–98.0)
MONO#: 0.5 10*3/uL (ref 0.1–0.9)
MONO%: 7.2 % (ref 0.0–14.0)
NEUT%: 68.7 % (ref 39.0–75.0)
NEUTROS ABS: 4.5 10*3/uL (ref 1.5–6.5)
Platelets: 127 10*3/uL — ABNORMAL LOW (ref 140–400)
RBC: 5.23 10*6/uL (ref 4.20–5.82)
RDW: 13 % (ref 11.0–14.6)
WBC: 6.5 10*3/uL (ref 4.0–10.3)
lymph#: 1.4 10*3/uL (ref 0.9–3.3)

## 2013-10-14 LAB — PROTIME-INR
INR: 1.8 — ABNORMAL LOW (ref 2.00–3.50)
PROTIME: 21.6 s — AB (ref 10.6–13.4)

## 2013-10-14 LAB — POCT INR: INR: 1.8

## 2013-10-14 MED ORDER — WARFARIN SODIUM 10 MG PO TABS: ORAL_TABLET | ORAL | Status: DC

## 2013-10-14 NOTE — Progress Notes (Signed)
INR = 1.8 on Coumadin 10 mg daily Pt skipped 2 days of his Coumadin-- last Thurs & Sat. No chest pain/SOB/leg pain or tenderness. INR low due to non-compliance.  Pt will take 15 mg today then 10 mg daily. Recheck protime in 3 weeks. Kennith Center, Pharm.D., CPP 10/14/2013@2 :55 PM

## 2013-11-04 ENCOUNTER — Ambulatory Visit (HOSPITAL_BASED_OUTPATIENT_CLINIC_OR_DEPARTMENT_OTHER): Payer: Self-pay | Admitting: Pharmacist

## 2013-11-04 ENCOUNTER — Other Ambulatory Visit (HOSPITAL_BASED_OUTPATIENT_CLINIC_OR_DEPARTMENT_OTHER): Payer: 59

## 2013-11-04 DIAGNOSIS — Z7901 Long term (current) use of anticoagulants: Secondary | ICD-10-CM

## 2013-11-04 DIAGNOSIS — R894 Abnormal immunological findings in specimens from other organs, systems and tissues: Secondary | ICD-10-CM

## 2013-11-04 DIAGNOSIS — R760 Raised antibody titer: Secondary | ICD-10-CM

## 2013-11-04 LAB — POCT INR: INR: 2.4

## 2013-11-04 LAB — PROTIME-INR
INR: 2.4 (ref 2.00–3.50)
PROTIME: 28.8 s — AB (ref 10.6–13.4)

## 2013-11-04 NOTE — Progress Notes (Signed)
INR at goal on coumadin 10mg  daily.  Medications reviewed and no changes.  Mr Raterman was concerned with some minor bruising on inner left leg.  The bruises looked to be healing to me.  He will call if they don't get better over the next week.  Will continue coumadin 10mg  daily and check PT/INR in 1 month.

## 2013-12-02 ENCOUNTER — Other Ambulatory Visit: Payer: 59

## 2013-12-02 ENCOUNTER — Ambulatory Visit: Payer: 59

## 2013-12-07 ENCOUNTER — Telehealth: Payer: Self-pay | Admitting: Pharmacist

## 2013-12-08 ENCOUNTER — Emergency Department (HOSPITAL_COMMUNITY)
Admission: EM | Admit: 2013-12-08 | Discharge: 2013-12-08 | Disposition: A | Discharge status: Home / Self Care | Payer: 59 | Attending: Emergency Medicine | Admitting: Emergency Medicine

## 2013-12-08 ENCOUNTER — Encounter (HOSPITAL_COMMUNITY): Payer: Self-pay | Admitting: Emergency Medicine

## 2013-12-08 DIAGNOSIS — Z8739 Personal history of other diseases of the musculoskeletal system and connective tissue: Secondary | ICD-10-CM | POA: Insufficient documentation

## 2013-12-08 DIAGNOSIS — G40909 Epilepsy, unspecified, not intractable, without status epilepticus: Secondary | ICD-10-CM | POA: Insufficient documentation

## 2013-12-08 DIAGNOSIS — I10 Essential (primary) hypertension: Secondary | ICD-10-CM | POA: Insufficient documentation

## 2013-12-08 DIAGNOSIS — Z8673 Personal history of transient ischemic attack (TIA), and cerebral infarction without residual deficits: Secondary | ICD-10-CM | POA: Insufficient documentation

## 2013-12-08 DIAGNOSIS — Z87891 Personal history of nicotine dependence: Secondary | ICD-10-CM | POA: Insufficient documentation

## 2013-12-08 HISTORY — DX: Essential (primary) hypertension: I10

## 2013-12-08 LAB — CBC
HEMATOCRIT: 47 % (ref 39.0–52.0)
Hemoglobin: 16.4 g/dL (ref 13.0–17.0)
MCH: 30.1 pg (ref 26.0–34.0)
MCHC: 34.9 g/dL (ref 30.0–36.0)
MCV: 86.4 fL (ref 78.0–100.0)
Platelets: 79 10*3/uL — ABNORMAL LOW (ref 150–400)
RBC: 5.44 MIL/uL (ref 4.22–5.81)
RDW: 13 % (ref 11.5–15.5)
WBC: 9 10*3/uL (ref 4.0–10.5)

## 2013-12-08 LAB — BASIC METABOLIC PANEL
BUN: 15 mg/dL (ref 6–23)
CHLORIDE: 102 meq/L (ref 96–112)
CO2: 19 meq/L (ref 19–32)
CREATININE: 1.94 mg/dL — AB (ref 0.50–1.35)
Calcium: 9.1 mg/dL (ref 8.4–10.5)
GFR calc Af Amer: 50 mL/min — ABNORMAL LOW (ref >90)
GFR calc non Af Amer: 43 mL/min — ABNORMAL LOW (ref >90)
Glucose, Bld: 121 mg/dL — ABNORMAL HIGH (ref 70–99)
Potassium: 3.9 mEq/L (ref 3.7–5.3)
Sodium: 141 mEq/L (ref 137–147)

## 2013-12-08 LAB — CBG MONITORING, ED: GLUCOSE-CAPILLARY: 116 mg/dL — AB (ref 70–99)

## 2013-12-08 MED ORDER — LAMOTRIGINE ER 300 MG PO TB24: 1.0000 | ORAL_TABLET | Freq: Every morning | ORAL | Status: DC

## 2013-12-08 MED ORDER — WARFARIN SODIUM 10 MG PO TABS: 10.0000 mg | ORAL_TABLET | Freq: Every evening | ORAL | Status: DC

## 2013-12-08 MED ORDER — LAMOTRIGINE 200 MG PO TABS
200.0000 mg | ORAL_TABLET | Freq: Once | ORAL | Status: AC
  Administered 2013-12-08: 200 mg via ORAL
  Filled 2013-12-08: qty 1

## 2013-12-08 MED ORDER — LISINOPRIL-HYDROCHLOROTHIAZIDE 10-12.5 MG PO TABS: 1.0000 | ORAL_TABLET | Freq: Every day | ORAL | Status: DC

## 2013-12-08 NOTE — Progress Notes (Signed)
  CARE MANAGEMENT ED NOTE 12/08/2013  Patient:  [name] [name], [name] [name]   Account Number:  [id number]  Date Initiated:  12/08/2013  Documentation initiated by:  Livia Snellen  Subjective/Objective Assessment:   Patient presents to Ed with seizure     Subjective/Objective Assessment Detail:   Patient reports not taking his seizure medication as prescribed due to lack of insurance and funds.  Patient with pmhx of seizures and stroke.     Action/Plan:   Action/Plan Detail:   Anticipated DC Date:  12/08/2013     Status Recommendation to Physician:   Result of Recommendation:    Other ED Services  Consult Working [place]  CM consult  Medication Assistance  [place] Program    Choice offered to / List presented to:            Status of service:  Completed, signed off  ED Comments:   ED Comments Detail:  EDCM spoke to patient at bedside.  Patient without insurance at this time.  Patient confrms he has the orange card.  [facility] rep spoke to patient and scheduled patient for an appointment to be seen at [facility] on July 6th at 2pm. Patient reports he does not have any medications at home at this time. EDCM entered patient into [facility] program.  EDCM explained to patient that this is a one time assist and will not be eligible for this assistance until this time next year.  [facility] also informed patient that he has seven days to get his prescriptions filled otherwise the [facility] letter will expire.  [facility] provided patient with [place] letter and list of participating pharmacies.  EDCM instructed patient to take his prescriptions and the [place] letter to one of the participating pharmacies on the list. Patient is agreeable to nine dollar copay for medications. Patient is aware to follow up with his pcp at [facility] where they have a pharmacy that can assist him with his medications.  EDCM instructed patient to take his seizure medication as prescribed.  Patient thankful for assistance. No further EDCM needs at  this time.

## 2013-12-08 NOTE — Progress Notes (Deleted)
  CARE MANAGEMENT ED NOTE 12/08/2013  Patient:  [name] [name]   Account Number:  [id number]  Date Initiated:  12/08/2013  Documentation initiated by:  Jackelyn Poling  Subjective/Objective Assessment:   33 yr old [place] access covered [employer] ([place])  pt DX anxiety     Subjective/Objective Assessment Detail:   Pt called "Jenny Reichmann" to attempt to get assist with bringing pt's home oxygen from home but he is unable to assist    Pcp dwight williams     Action/Plan:   ED CM consulted by ED SW to assist with home oxygen delivery to   Action/Plan Detail:   1410 ED CM called [phone] option 2, option 1 and spoke with Bethena Roys about obtaining a portable oxygen tank to assist with d/c Provided pt ED rm #, home address, resume home oxygen orders   Anticipated DC Date:  12/08/2013     Status Recommendation to Physician:   Result of Recommendation:    Other ED Services  Consult Working Plan   In-house referral  Clinical Social Worker   DC Forensic scientist  Other  Outpatient Services - Pt will follow up  PCP issues   PAC Choice  DURABLE MEDICAL EQUIPMENT   Choice offered to / List presented to:  C-1 Patient  DME arranged  [employer]     DME agency  [employer]        Status of service:  Completed, signed off  ED Comments:   ED Comments Detail:  12/08/13 1515 [employer] Delivered Portable tank to pt Pt now awaiting SW to assist with transport home 1433  ED CM received a call from [place] at [facility] from [phone] stating it would be "two hours" before oxygen can be delivered to [facility] for pt Informed her pt is willing to wait for it. Ms Bethena Roys states she forwarded order to [employer] with indication to call Cm with estimated ETA. CM discussed with Ms Bethena Roys that pt ED dx was anxiety and pt to be d/c within the next 15-30 minutes. Call ended at 1421 with Bethena Roys Pending response from [place]

## 2013-12-08 NOTE — Discharge Instructions (Signed)
Take the prescribed medication as directed.  You may get these filled at the clinic pharmacy. [facility] you on July 6th. Return to the ED for new or worsening symptoms.

## 2013-12-08 NOTE — ED Notes (Signed)
Per ems pt hx of stroke, HTN, lupus, and seizures. Pt reports he has not taken seizure meds in 2 months. Pt was at work, found on ground, seizure like activity, postictal upon EMS arrival. Tongue injury noted. Denies pain. Upon arrival to ED pt still slightly postictal.

## 2013-12-08 NOTE — ED Notes (Signed)
Bed: WA17 Expected date:  Expected time:  Means of arrival:  Comments: ems- seizure, postictal

## 2013-12-08 NOTE — ED Provider Notes (Signed)
CSN: [id number]     Arrival date & time 12/08/13  1347 History   First MD Initiated Contact with Patient 12/08/13 1501     Chief Complaint  Patient presents with  . Seizures  . Hypertension     (Consider location/radiation/quality/duration/timing/severity/associated sxs/prior Treatment) The history is provided by the patient and medical records.   This is a 33 y.o. M with PMH significant for prior CVA supposed to be on coumadin, Seizure disorder on lamictal, lupus, HTN, presenting to the ED today for a seizure.  Pt states he was at work and was found down on the ground with grand-mal seizure like activity which lasted for an unknown period of time.  Pt was post-ictal upon EMS arrival.  No bowel or bladder incontinence.  Pt states he has been taking his medications sporadically over the past 2 months in an attempt to stretch them until he can afford to get them refilled (lost his insurance and cannot afford out of pocket costs).  Denies any recent fever, chills, sweats, or illness.  No EtOH or illicit drug use.     Past Medical History  Diagnosis Date  . Stroke   . Seizures   . Seizures   . Stroke     1 or 33 years of age.    . Lupus   . Hypertension    Past Surgical History  Procedure Laterality Date  . Renal biopsy     Family History  Problem Relation Age of Onset  . Diabetes Mother   . Diabetes Other    History  Substance Use Topics  . Smoking status: Former Research scientist (life sciences)  . Smokeless tobacco: Never Used  . Alcohol Use: 0.6 oz/week    1 Cans of beer per week     Comment: occassional    Review of Systems  Neurological: Positive for seizures.  All other systems reviewed and are negative.     Allergies  Review of patient's allergies indicates no known allergies.  Home Medications   Prior to Admission medications   Medication Sig Start Date End Date Taking? Authorizing Provider  LamoTRIgine 300 MG TB24 Take 1 tablet by mouth every morning.   Yes Historical Provider,  MD  lisinopril-hydrochlorothiazide (PRINZIDE,ZESTORETIC) 10-12.5 MG per tablet Take 1 tablet by mouth every morning.    Yes Historical Provider, MD  warfarin (COUMADIN) 10 MG tablet Take 10 mg by mouth every evening.   Yes Historical Provider, MD   BP 145/70  Pulse 110  Temp(Src) 98.1 F (36.7 C) (Oral)  Resp 21  SpO2 91%  Physical Exam  Nursing note and vitals reviewed. Constitutional: He is oriented to person, place, and time. He appears well-developed and well-nourished.  HENT:  Head: Normocephalic and atraumatic. Head is without Battle's sign, without abrasion and without contusion. Hair is normal.  Right Ear: Tympanic membrane and ear canal normal.  Left Ear: Tympanic membrane and ear canal normal.  Nose: Nose normal.  Mouth/Throat: Uvula is midline, oropharynx is clear and moist and mucous membranes are normal. Lacerations present.  TMS normal bilaterally; mid-face stable; Small laceration noted to right side of tongue; no active bleeding at this time; dentition intact  Eyes: Conjunctivae and EOM are normal. Pupils are equal, round, and reactive to light.  Neck: Normal range of motion. Neck supple.  Cardiovascular: Normal rate, regular rhythm and normal heart sounds.   Pulmonary/Chest: Effort normal and breath sounds normal. No respiratory distress. He has no wheezes.  Abdominal: Soft. Bowel sounds are normal. There is  no tenderness. There is no guarding.  Musculoskeletal: Normal range of motion.  Neurological: He is alert and oriented to person, place, and time. He has normal strength. He displays no tremor. No cranial nerve deficit or sensory deficit. He displays no seizure activity.  AAOx3, answering questions and following appropriately; equal strength UE and LE bilaterally; CN grossly intact; moves all extremities appropriately without ataxia; normal coordination; no focal neuro deficits or facial asymmetry appreciated  Skin: Skin is warm and dry.  Psychiatric: He has a  normal mood and affect.    ED Course  Procedures (including critical care time) Labs Review Labs Reviewed  BASIC METABOLIC PANEL - Abnormal; Notable for the following:    Glucose, Bld 121 (*)    Creatinine, Ser 1.94 (*)    GFR calc non Af Amer 43 (*)    GFR calc Af Amer 50 (*)    All other components within normal limits  CBC - Abnormal; Notable for the following:    Platelets 79 (*)    All other components within normal limits  CBG MONITORING, ED - Abnormal; Notable for the following:    Glucose-Capillary 116 (*)    All other components within normal limits  CBG MONITORING, ED    Imaging Review No results found.   EKG Interpretation None      MDM   Final diagnoses:  Seizure disorder   33 year old male with history of seizure disorder and medication noncompliance presenting to the ED for seizure. On my initial evaluation he has returned to his baseline orientation and is without focal neurologic deficit.  He has no current complaints at this time.  Labs were obtained which are reassuring.  EKG sinus tach without ischemic change.  PT-INR and lamictal level not obtained as unlikely to be therapeutic given his medication non-compliance.  Pt did have a brief episode of desaturation to 84% ion arrival but quickly corrected with supplemental O2 and has remained stable ever since.  At this time i have low suspicion for any acute intracranial process and feel pt is safe for discharge. Care management has met with pt, arranged FU appt on 12/21/13 with wellness clinic and has enrolled him in match program to help him with medications costs.  Discussed plan with patient, he/she acknowledged understanding and agreed with plan of care.  Return precautions given for new or worsening symptoms.    Larene Pickett, PA-C 12/08/13 2003

## 2013-12-08 NOTE — Progress Notes (Addendum)
[facility] CL spoke with patient who has [place] with [employer] and [facility]. CL scheduled a new patient apt on 7/6 at 2:00 pm. Patient expressed concerns about getting medication. CL explained to patient that he could take prescriptions to [facility] on-site pharmacy.

## 2013-12-09 NOTE — ED Provider Notes (Signed)
Medical screening examination/treatment/procedure(s) were conducted as a shared visit with non-physician practitioner(s) and myself.  I personally evaluated the patient during the encounter.   EKG Interpretation None      I interviewed and examined the patient. Lungs are CTAB. Cardiac exam wnl. Abdomen soft.  Non-compliant w/ meds, likely d/t financial reasons. Care mgmt spoke w/ pt. Will get him his meds. Pt continues to appear well.   Blanchard Kelch, MD 12/09/13 [phone]

## 2013-12-21 ENCOUNTER — Ambulatory Visit: Payer: 59 | Admitting: Internal Medicine

## 2013-12-22 ENCOUNTER — Telehealth: Payer: Self-pay | Admitting: Pharmacist

## 2014-01-18 ENCOUNTER — Ambulatory Visit: Payer: No Typology Code available for payment source | Attending: Internal Medicine | Admitting: Internal Medicine

## 2014-01-18 ENCOUNTER — Encounter: Payer: Self-pay | Admitting: Internal Medicine

## 2014-01-18 VITALS — BP 122/75 | HR 76 | Temp 98.2°F | Resp 20 | Ht 74.0 in | Wt 311.8 lb

## 2014-01-18 DIAGNOSIS — D6859 Other primary thrombophilia: Secondary | ICD-10-CM

## 2014-01-18 DIAGNOSIS — I129 Hypertensive chronic kidney disease with stage 1 through stage 4 chronic kidney disease, or unspecified chronic kidney disease: Secondary | ICD-10-CM | POA: Insufficient documentation

## 2014-01-18 DIAGNOSIS — N183 Chronic kidney disease, stage 3 unspecified: Secondary | ICD-10-CM

## 2014-01-18 DIAGNOSIS — Z7689 Persons encountering health services in other specified circumstances: Secondary | ICD-10-CM

## 2014-01-18 DIAGNOSIS — R569 Unspecified convulsions: Secondary | ICD-10-CM

## 2014-01-18 DIAGNOSIS — Z8673 Personal history of transient ischemic attack (TIA), and cerebral infarction without residual deficits: Secondary | ICD-10-CM | POA: Insufficient documentation

## 2014-01-18 DIAGNOSIS — Z7901 Long term (current) use of anticoagulants: Secondary | ICD-10-CM | POA: Insufficient documentation

## 2014-01-18 DIAGNOSIS — I10 Essential (primary) hypertension: Secondary | ICD-10-CM

## 2014-01-18 DIAGNOSIS — D6862 Lupus anticoagulant syndrome: Secondary | ICD-10-CM

## 2014-01-18 DIAGNOSIS — Z7189 Other specified counseling: Secondary | ICD-10-CM

## 2014-01-18 LAB — POCT INR: INR: 1

## 2014-01-18 MED ORDER — LAMOTRIGINE ER 300 MG PO TB24: 1.0000 | ORAL_TABLET | Freq: Every morning | ORAL | Status: DC

## 2014-01-18 MED ORDER — WARFARIN SODIUM 10 MG PO TABS: 10.0000 mg | ORAL_TABLET | Freq: Every evening | ORAL | Status: DC

## 2014-01-18 MED ORDER — LISINOPRIL-HYDROCHLOROTHIAZIDE 10-12.5 MG PO TABS: 1.0000 | ORAL_TABLET | Freq: Every day | ORAL | Status: DC

## 2014-01-18 NOTE — Progress Notes (Signed)
Patient presents to establish care for seizures States s/p CVA at age 33 States he ran out of coumadin 2 weeks ago States he had been seeing a renal specialist until he lost his insurance May of this year.

## 2014-01-18 NOTE — Progress Notes (Signed)
Patient ID: [name] [name], male   DOB: August 21, 1980, 33 y.o.   MRN: [id number]  [id number]  [id number]  DOB - December 04, 1980  CC:  Chief Complaint  Patient presents with  . Establish Care  . Seizures       HPI: [name] [name] is a 33 y.o. male here today to establish medical care.  Patient reports that he has a history of seizure disorder, HTN, cva, and lupus anticoagulant.  He reports his last seizure being in June when he ran out of medication due to lack of insurance.  Patient reports he was being seen in the past by a nephrologist [facility]) for "bad kidneys".  He is unsure exactly why he needed to see a nephrologist or what caused kidney damage.  He reports that he was being seen by [facility] for brown urine who later referred him to a kidney specialist who found he had lupus anticoagulant.  He had a seizure while being workup at the nephrologist and was then diagnosed with seizure disorder, all three years ago. Patient reports that he has been out of Coumadin 2 weeks.     No Known Allergies Past Medical History  Diagnosis Date  . Stroke   . Seizures   . Seizures   . Stroke     66 or 33 years of age.    . Lupus   . Hypertension    Current Outpatient Prescriptions on File Prior to Visit  Medication Sig Dispense Refill  . LamoTRIgine 300 MG TB24 Take 1 tablet by mouth every morning.      Marland Kitchen lisinopril-hydrochlorothiazide (PRINZIDE) 10-12.5 MG per tablet Take 1 tablet by mouth daily.  30 tablet  0  . LamoTRIgine 300 MG TB24 Take 1 tablet (300 mg total) by mouth every morning.  30 tablet  0  . lisinopril-hydrochlorothiazide (PRINZIDE,ZESTORETIC) 10-12.5 MG per tablet Take 1 tablet by mouth every morning.       . warfarin (COUMADIN) 10 MG tablet Take 10 mg by mouth every evening.      . warfarin (COUMADIN) 10 MG tablet Take 1 tablet (10 mg total) by mouth every evening.  30 tablet  0   No current facility-administered medications on file prior to visit.   Family History    Problem Relation Age of Onset  . Diabetes Mother   . Diabetes Other    History   Social History  . Marital Status: Single    Spouse Name: N/A    Number of Children: N/A  . Years of Education: N/A   Occupational History  . Not on file.   Social History Main Topics  . Smoking status: Former Research scientist (life sciences)  . Smokeless tobacco: Never Used  . Alcohol Use: 0.6 oz/week    1 Cans of beer per week     Comment: occassional  . Drug Use: No  . Sexual Activity: Not on file   Other Topics Concern  . Not on file   Social History Narrative  . No narrative on file    Review of Systems: Constitutional: Negative for fever, chills, diaphoresis, activity change, appetite change and fatigue. HENT: Negative for ear pain, nosebleeds, congestion, facial swelling, rhinorrhea, neck pain, neck stiffness and ear discharge.  Eyes: Negative for pain, discharge, redness, itching and visual disturbance. Respiratory: Negative for cough, choking, chest tightness, shortness of breath, wheezing and stridor.  Cardiovascular: Negative for chest pain, palpitations and leg swelling. Gastrointestinal: Negative for abdominal distention. Genitourinary: Negative for dysuria, urgency, frequency, hematuria, flank pain, decreased  urine volume, difficulty urinating and dyspareunia.  Musculoskeletal: Negative for back pain, joint swelling, arthralgia and gait problem. Neurological: Negative for dizziness, tremors, seizures, syncope, facial asymmetry, speech difficulty, weakness, light-headedness, numbness and headaches.  Hematological: Negative for adenopathy. Does not bruise/bleed easily. Psychiatric/Behavioral: Negative for hallucinations, behavioral problems, confusion, dysphoric mood, decreased concentration and agitation.    Objective:   Filed Vitals:   01/18/14 1608  BP: 122/75  Pulse: 76  Temp: 98.2 F (36.8 C)  Resp: 20    Physical Exam: Constitutional: Patient appears well-developed and well-nourished. No  distress. HENT: Normocephalic, atraumatic, External right and left ear normal. Oropharynx is clear and moist.  Eyes: Conjunctivae and EOM are normal. PERRLA, no scleral icterus. Neck: Normal ROM. Neck supple. No JVD. No tracheal deviation. No thyromegaly. CVS: RRR, S1/S2 +, no murmurs, no gallops, no carotid bruit.  Pulmonary: Effort and breath sounds normal, no stridor, rhonchi, wheezes, rales.  Abdominal: Soft. BS +, no distension, tenderness, rebound or guarding.  Musculoskeletal: Normal range of motion. No edema and no tenderness.  Lymphadenopathy: No lymphadenopathy noted, cervical Neuro: Alert. Normal reflexes, muscle tone coordination. No cranial nerve deficit. Skin: Skin is warm and dry. No rash noted. Not diaphoretic. No erythema. No pallor. Psychiatric: Normal mood and affect. Behavior, judgment, thought content normal.  Lab Results  Component Value Date   WBC 9.0 12/08/2013   HGB 16.4 12/08/2013   HCT 47.0 12/08/2013   MCV 86.4 12/08/2013   PLT 79* 12/08/2013   Lab Results  Component Value Date   CREATININE 1.94* 12/08/2013   BUN 15 12/08/2013   NA 141 12/08/2013   K 3.9 12/08/2013   CL 102 12/08/2013   CO2 19 12/08/2013    No results found for this basename: HGBA1C   Lipid Panel  No results found for this basename: chol, trig, hdl, cholhdl, vldl, ldlcalc       Assessment and plan:   [name] [name] was seen today for establish care and seizures.  Diagnoses and associated orders for this visit:  Lupus anticoagulant syndrome Continue coumadin - INR - warfarin (COUMADIN) 10 MG tablet; Take 1 tablet (10 mg total) by mouth every evening.  Chronic kidney disease, stage 3 (moderate) - Ambulatory referral to Nephrology  Essential hypertension - lisinopril-hydrochlorothiazide (PRINZIDE) 10-12.5 MG per tablet; Take 1 tablet by mouth daily.  Seizures - Lamotrigine level - LamoTRIgine 300 MG TB24; Take 1 tablet (300 mg total) by mouth every morning.  Encounter to establish  care - COMPLETE METABOLIC PANEL WITH GFR - Hemoglobin A1c       Return for Friday for INR , Nurse Visit and 3 mo PCP.  Patient will be placed on Dr. Doreene Burke schedule for continued care     Chari Manning, [place] [facility] [phone] 01/18/2014, 4:23 PM

## 2014-01-19 LAB — COMPLETE METABOLIC PANEL WITH GFR
ALBUMIN: 3.6 g/dL (ref 3.5–5.2)
ALK PHOS: 75 U/L (ref 39–117)
ALT: 39 U/L (ref 0–53)
AST: 27 U/L (ref 0–37)
BILIRUBIN TOTAL: 0.4 mg/dL (ref 0.2–1.2)
BUN: 16 mg/dL (ref 6–23)
CO2: 25 mEq/L (ref 19–32)
Calcium: 8.5 mg/dL (ref 8.4–10.5)
Chloride: 106 mEq/L (ref 96–112)
Creat: 1.94 mg/dL — ABNORMAL HIGH (ref 0.50–1.35)
GFR, Est African American: 51 mL/min — ABNORMAL LOW
GFR, Est Non African American: 44 mL/min — ABNORMAL LOW
Glucose, Bld: 94 mg/dL (ref 70–99)
POTASSIUM: 4.3 meq/L (ref 3.5–5.3)
SODIUM: 142 meq/L (ref 135–145)
TOTAL PROTEIN: 6.6 g/dL (ref 6.0–8.3)

## 2014-01-19 LAB — HEMOGLOBIN A1C
Hgb A1c MFr Bld: 5.8 % — ABNORMAL HIGH (ref <5.7)
MEAN PLASMA GLUCOSE: 120 mg/dL — AB (ref <117)

## 2014-01-20 ENCOUNTER — Telehealth: Payer: Self-pay | Admitting: Emergency Medicine

## 2014-01-20 LAB — LAMOTRIGINE LEVEL: Lamotrigine Lvl: 3.3 ug/mL — ABNORMAL LOW (ref 4.0–18.0)

## 2014-01-20 NOTE — Telephone Encounter (Signed)
Left message for pt to discuss lab results

## 2014-01-20 NOTE — Telephone Encounter (Signed)
Message copied by Ricci Barker on Wed Jan 20, 2014  5:48 PM ------      Message from: Chari Manning A      Created: Wed Jan 20, 2014  3:50 PM       Let him know is kidney function is still elevated and to continue to take his lamictal, his blood level is slightly low. ------

## 2014-01-22 ENCOUNTER — Ambulatory Visit: Payer: No Typology Code available for payment source | Attending: Internal Medicine

## 2014-01-22 DIAGNOSIS — Z7901 Long term (current) use of anticoagulants: Secondary | ICD-10-CM

## 2014-01-22 DIAGNOSIS — R760 Raised antibody titer: Secondary | ICD-10-CM

## 2014-01-22 DIAGNOSIS — D696 Thrombocytopenia, unspecified: Secondary | ICD-10-CM

## 2014-01-22 LAB — POCT INR
INR: 1.1
INR: 11.1

## 2014-01-24 ENCOUNTER — Encounter: Payer: Self-pay | Admitting: Internal Medicine

## 2014-01-25 ENCOUNTER — Ambulatory Visit: Payer: No Typology Code available for payment source | Attending: Internal Medicine

## 2014-01-25 DIAGNOSIS — Z7901 Long term (current) use of anticoagulants: Secondary | ICD-10-CM

## 2014-01-25 DIAGNOSIS — D696 Thrombocytopenia, unspecified: Secondary | ICD-10-CM

## 2014-01-25 DIAGNOSIS — R760 Raised antibody titer: Secondary | ICD-10-CM

## 2014-01-25 LAB — POCT INR: INR: 1.6

## 2014-01-25 NOTE — Telephone Encounter (Signed)
Phone call - encounter closed. 

## 2014-01-29 ENCOUNTER — Other Ambulatory Visit: Payer: Self-pay | Admitting: Internal Medicine

## 2014-01-29 DIAGNOSIS — R569 Unspecified convulsions: Secondary | ICD-10-CM

## 2014-01-29 MED ORDER — LAMOTRIGINE ER 300 MG PO TB24: 1.0000 | ORAL_TABLET | Freq: Every morning | ORAL | Status: DC

## 2014-02-01 ENCOUNTER — Ambulatory Visit: Payer: 59 | Attending: Internal Medicine

## 2014-02-01 DIAGNOSIS — R760 Raised antibody titer: Secondary | ICD-10-CM

## 2014-02-01 DIAGNOSIS — Z7901 Long term (current) use of anticoagulants: Secondary | ICD-10-CM

## 2014-02-01 DIAGNOSIS — D696 Thrombocytopenia, unspecified: Secondary | ICD-10-CM

## 2014-02-01 LAB — POCT INR: INR: 2.8

## 2014-02-08 ENCOUNTER — Ambulatory Visit: Payer: No Typology Code available for payment source | Attending: Internal Medicine

## 2014-02-08 DIAGNOSIS — R760 Raised antibody titer: Secondary | ICD-10-CM

## 2014-02-08 DIAGNOSIS — Z7901 Long term (current) use of anticoagulants: Secondary | ICD-10-CM

## 2014-02-08 LAB — POCT INR
INR: 1.1
INR: 1.1

## 2014-02-08 MED ORDER — WARFARIN SODIUM 2.5 MG PO TABS: 2.5000 mg | ORAL_TABLET | Freq: Every day | ORAL | Status: DC

## 2014-02-11 NOTE — Telephone Encounter (Signed)
Encounter was telephone call. 

## 2014-02-15 ENCOUNTER — Ambulatory Visit: Payer: No Typology Code available for payment source | Attending: Internal Medicine

## 2014-02-15 DIAGNOSIS — R760 Raised antibody titer: Secondary | ICD-10-CM

## 2014-02-15 DIAGNOSIS — D696 Thrombocytopenia, unspecified: Secondary | ICD-10-CM

## 2014-02-15 DIAGNOSIS — Z7901 Long term (current) use of anticoagulants: Secondary | ICD-10-CM

## 2014-02-15 LAB — POCT INR: INR: 2.6

## 2014-03-19 ENCOUNTER — Other Ambulatory Visit: Payer: Self-pay | Admitting: Internal Medicine

## 2014-03-26 ENCOUNTER — Other Ambulatory Visit: Payer: Self-pay | Admitting: Internal Medicine

## 2014-03-30 ENCOUNTER — Other Ambulatory Visit: Payer: Self-pay

## 2014-03-30 DIAGNOSIS — Z7901 Long term (current) use of anticoagulants: Secondary | ICD-10-CM

## 2014-03-30 MED ORDER — WARFARIN SODIUM 10 MG PO TABS: 10.0000 mg | ORAL_TABLET | Freq: Every evening | ORAL | Status: DC

## 2014-03-31 ENCOUNTER — Ambulatory Visit: Payer: Self-pay | Attending: Internal Medicine

## 2014-03-31 DIAGNOSIS — R760 Raised antibody titer: Secondary | ICD-10-CM

## 2014-03-31 DIAGNOSIS — Z7901 Long term (current) use of anticoagulants: Secondary | ICD-10-CM

## 2014-03-31 LAB — POCT INR: INR: 2.3

## 2014-03-31 MED ORDER — LISINOPRIL-HYDROCHLOROTHIAZIDE 10-12.5 MG PO TABS: ORAL_TABLET | ORAL | Status: DC

## 2014-04-21 ENCOUNTER — Ambulatory Visit: Payer: No Typology Code available for payment source

## 2014-09-03 ENCOUNTER — Other Ambulatory Visit: Payer: Self-pay | Admitting: Emergency Medicine

## 2014-09-03 ENCOUNTER — Other Ambulatory Visit: Payer: Self-pay | Admitting: Internal Medicine

## 2015-01-10 ENCOUNTER — Other Ambulatory Visit: Payer: Self-pay | Admitting: Internal Medicine

## 2015-01-11 NOTE — Telephone Encounter (Signed)
Patient came into facility to request a med refill for lisinopril-hydrochlorothiazide (PRINZIDE,ZESTORETIC) 10-12.5 MG per tablet patient uses [facility]. Please f/u with pt.

## 2015-01-12 ENCOUNTER — Encounter: Payer: Self-pay | Admitting: Internal Medicine

## 2015-01-12 ENCOUNTER — Ambulatory Visit: Payer: Self-pay | Attending: Internal Medicine | Admitting: Internal Medicine

## 2015-01-12 VITALS — BP 115/70 | HR 68 | Temp 98.6°F | Resp 16 | Ht 72.5 in | Wt 289.0 lb

## 2015-01-12 DIAGNOSIS — F17200 Nicotine dependence, unspecified, uncomplicated: Secondary | ICD-10-CM

## 2015-01-12 DIAGNOSIS — R760 Raised antibody titer: Secondary | ICD-10-CM

## 2015-01-12 DIAGNOSIS — Z7901 Long term (current) use of anticoagulants: Secondary | ICD-10-CM | POA: Insufficient documentation

## 2015-01-12 DIAGNOSIS — F17210 Nicotine dependence, cigarettes, uncomplicated: Secondary | ICD-10-CM | POA: Insufficient documentation

## 2015-01-12 DIAGNOSIS — Z6838 Body mass index (BMI) 38.0-38.9, adult: Secondary | ICD-10-CM | POA: Insufficient documentation

## 2015-01-12 DIAGNOSIS — R7989 Other specified abnormal findings of blood chemistry: Secondary | ICD-10-CM

## 2015-01-12 DIAGNOSIS — I10 Essential (primary) hypertension: Secondary | ICD-10-CM | POA: Insufficient documentation

## 2015-01-12 DIAGNOSIS — D6862 Lupus anticoagulant syndrome: Secondary | ICD-10-CM | POA: Insufficient documentation

## 2015-01-12 DIAGNOSIS — R748 Abnormal levels of other serum enzymes: Secondary | ICD-10-CM | POA: Insufficient documentation

## 2015-01-12 DIAGNOSIS — R790 Abnormal level of blood mineral: Secondary | ICD-10-CM

## 2015-01-12 DIAGNOSIS — Z23 Encounter for immunization: Secondary | ICD-10-CM

## 2015-01-12 DIAGNOSIS — Z79899 Other long term (current) drug therapy: Secondary | ICD-10-CM | POA: Insufficient documentation

## 2015-01-12 DIAGNOSIS — Z720 Tobacco use: Secondary | ICD-10-CM

## 2015-01-12 DIAGNOSIS — Z8673 Personal history of transient ischemic attack (TIA), and cerebral infarction without residual deficits: Secondary | ICD-10-CM | POA: Insufficient documentation

## 2015-01-12 DIAGNOSIS — E663 Overweight: Secondary | ICD-10-CM | POA: Insufficient documentation

## 2015-01-12 LAB — POCT INR: INR: 1

## 2015-01-12 MED ORDER — LISINOPRIL-HYDROCHLOROTHIAZIDE 10-12.5 MG PO TABS: ORAL_TABLET | ORAL | Status: DC

## 2015-01-12 MED ORDER — WARFARIN SODIUM 5 MG PO TABS: 5.0000 mg | ORAL_TABLET | Freq: Every day | ORAL | Status: DC

## 2015-01-12 NOTE — Patient Instructions (Addendum)
Stop smoking ASAP!!!!!!!! Smoking leads to more strokes and blood clots!!!  The kidney specialist will call you for a appointment!!!

## 2015-01-12 NOTE — Progress Notes (Signed)
F/U HTN Tobacco user- 4 cigarette per day

## 2015-01-12 NOTE — Progress Notes (Signed)
Patient ID: [name] [name], male   DOB: 01-30-1981, 34 y.o.   MRN: [id number] Subjective:  [name] [name] is a 34 y.o. male with hypertension. Patient has a history of Lupus anticoagulant and HTN. He reports that he has stopped taking coumadin last month. His last documented INR check was in October 2015. He states that he continued to take the dose he was on without being checked because he did not have insurance or transportation to get to the clinic. He ran out of BP medication yesterday. He continues to smoke 4 cigarettes per day. He reports that he had 3 strokes in the past and was told that he would be on anticoagulants for life.   Current Outpatient Prescriptions  Medication Sig Dispense Refill  . LamoTRIgine 300 MG TB24 Take 1 tablet (300 mg total) by mouth every morning. 90 tablet 3  . lisinopril-hydrochlorothiazide (PRINZIDE,ZESTORETIC) 10-12.5 MG per tablet TAKE 1 TABLET BY MOUTH ONCE DAILY 30 tablet 2  . COUMADIN 10 MG tablet TAKE 1 TABLET BY MOUTH EVERY EVENING. 30 tablet 0  . COUMADIN 2.5 MG tablet TAKE AS DIRECTED (Patient not taking: Reported on 01/12/2015) 30 tablet 0   No current facility-administered medications for this visit.    Hypertension ROS: taking medications as instructed, no medication side effects noted, no TIA's, no chest pain on exertion, no dyspnea on exertion, no swelling of ankles and no palpitations.    Objective:  BP 115/70 mmHg  Pulse 68  Temp(Src) 98.6 F (37 C) (Oral)  Resp 16  Ht 6' 0.5" (1.842 m)  Wt 289 lb (131.09 kg)  BMI 38.64 kg/m2  SpO2 98%  Appearance alert, well appearing, and in no distress, oriented to person, place, and time and overweight. General exam BP noted to be well controlled today in office, S1, S2 normal, no gallop, no murmur, chest clear, no JVD, no HSM, no edema.  Lab review: orders written for new lab studies as appropriate; see orders.   [name] [name] was seen today for follow-up.  Diagnoses and all orders for this  visit:  Essential hypertension Orders: -     COMPLETE METABOLIC PANEL WITH GFR -     lisinopril-hydrochlorothiazide (PRINZIDE,ZESTORETIC) 10-12.5 MG per tablet; TAKE 1 TABLET BY MOUTH ONCE DAILY Well controlled.  Reviewed diet, exercise and weight control. Recommended sodium restriction. Very strongly urged to quit smoking to reduce cardiovascular risk  Lupus anticoagulant positive Orders: -     INR-1.0  -    Begin warfarin (COUMADIN) 5 MG tablet; Take 1 tablet (5 mg total) by mouth daily. -     Ambulatory referral to Hematology I will place him back on 5 mg daily and have him to come back next week for a recheck  Elevated serum creatinine Orders: -     Ambulatory referral to Nephrology  I will have the patient to re-establish with Nephrology due to his elevated creatinine and low GFR  TOBACCO DEPENDENCE Smoking cessation discussed for 3 minutes, patient is not willing to quit at this time. Will continue to assess on each visit. Discussed increased risk for diseases such as cancer, heart disease, and stroke.   Need for Tdap vaccination Orders: -     Tdap vaccine greater than or equal to 7yo IM   Return in about 1 week (around 01/19/2015) for coumdin clinic and 6 mo PCP .  Lance Bosch, NP 01/12/2015 6:06 PM

## 2015-01-13 LAB — COMPLETE METABOLIC PANEL WITH GFR
ALT: 20 U/L (ref 9–46)
AST: 19 U/L (ref 10–40)
Albumin: 3.6 g/dL (ref 3.6–5.1)
Alkaline Phosphatase: 83 U/L (ref 40–115)
BILIRUBIN TOTAL: 0.4 mg/dL (ref 0.2–1.2)
BUN: 37 mg/dL — ABNORMAL HIGH (ref 7–25)
CO2: 22 mEq/L (ref 20–31)
Calcium: 9 mg/dL (ref 8.6–10.3)
Chloride: 108 mEq/L (ref 98–110)
Creat: 2.32 mg/dL — ABNORMAL HIGH (ref 0.60–1.35)
GFR, EST NON AFRICAN AMERICAN: 35 mL/min — AB (ref >=60)
GFR, Est African American: 41 mL/min — ABNORMAL LOW (ref >=60)
GLUCOSE: 99 mg/dL (ref 65–99)
Potassium: 5.9 mEq/L — ABNORMAL HIGH (ref 3.5–5.3)
Sodium: 139 mEq/L (ref 135–146)
Total Protein: 7 g/dL (ref 6.1–8.1)

## 2015-01-16 ENCOUNTER — Other Ambulatory Visit: Payer: Self-pay | Admitting: Internal Medicine

## 2015-01-16 DIAGNOSIS — E875 Hyperkalemia: Secondary | ICD-10-CM

## 2015-01-16 MED ORDER — SODIUM POLYSTYRENE SULFONATE PO POWD: Freq: Once | ORAL | Status: DC

## 2015-01-17 ENCOUNTER — Telehealth: Payer: Self-pay

## 2015-01-17 NOTE — Telephone Encounter (Signed)
Nurse called patient, patient verified date of birth. Patient aware of elevated potassium level. Patient understands importance of picking up and taking kayexalate to get the potassium out of his body. Patient aware this is a one time dose and will cause loose bowel movements. Nurse educated patient about potassium can effect the heart and other organs if it remains elevated. Patient agrees to take medication.  Patient also aware of nephrology referral sent due to worsening kidney function. Patient understands he will get a call about referral and the importance of setting up appointment.  Patient voices understanding and has no further questions at this time.

## 2015-01-17 NOTE — Telephone Encounter (Signed)
See previous note

## 2015-01-17 NOTE — Telephone Encounter (Signed)
-----   Message from Lance Bosch, NP sent at 01/16/2015  8:44 PM EDT ----- Patient's potassium is elevated. I will send him Kayexalate. This will cause him to have loose stools to remove potassium from his body. Patients kidney function is worse, I have sent Nephrology referral

## 2015-01-18 ENCOUNTER — Other Ambulatory Visit: Payer: Self-pay | Admitting: Pharmacist

## 2015-01-18 ENCOUNTER — Other Ambulatory Visit: Payer: Self-pay | Admitting: Internal Medicine

## 2015-01-18 ENCOUNTER — Ambulatory Visit: Payer: Self-pay | Attending: Internal Medicine | Admitting: Pharmacist

## 2015-01-18 DIAGNOSIS — R790 Abnormal level of blood mineral: Secondary | ICD-10-CM

## 2015-01-18 DIAGNOSIS — R760 Raised antibody titer: Secondary | ICD-10-CM

## 2015-01-18 DIAGNOSIS — Z7901 Long term (current) use of anticoagulants: Secondary | ICD-10-CM

## 2015-01-18 DIAGNOSIS — E875 Hyperkalemia: Secondary | ICD-10-CM

## 2015-01-18 LAB — POCT INR: INR: 1.1

## 2015-01-18 MED ORDER — SODIUM POLYSTYRENE SULFONATE 15 GM/60ML PO SUSP: 60.0000 g | Freq: Once | ORAL | Status: DC

## 2015-01-18 MED ORDER — WARFARIN SODIUM 10 MG PO TABS: 10.0000 mg | ORAL_TABLET | Freq: Every day | ORAL | Status: DC

## 2015-01-18 MED ORDER — SODIUM POLYSTYRENE SULFONATE 15 GM/60ML PO SUSP: ORAL | Status: DC

## 2015-01-18 MED ORDER — SODIUM POLYSTYRENE SULFONATE 15 GM/60ML PO SUSP: 15.0000 g | Freq: Once | ORAL | Status: DC

## 2015-01-18 NOTE — Progress Notes (Signed)
Patient has been lost to follow up for anticoagulation monitoring and has come to establish care.  Patient denies ever being bridged on warfarin.  He reports that he typically takes 10 mg daily and has had consistent INRs in range but recently was changed to 5 mg daily - he is not sure why.  Will restart him at his typical dose and give a boost with 5 mg extra today.

## 2015-01-25 ENCOUNTER — Ambulatory Visit: Payer: Self-pay | Attending: Internal Medicine | Admitting: Pharmacist

## 2015-01-25 DIAGNOSIS — Z7901 Long term (current) use of anticoagulants: Secondary | ICD-10-CM

## 2015-01-25 DIAGNOSIS — R760 Raised antibody titer: Secondary | ICD-10-CM

## 2015-01-25 LAB — POCT INR: INR: 2.1

## 2015-01-25 NOTE — Progress Notes (Signed)
Pt on warfarin for lupus anticoagulation.  INR therapeutic today at 2.1. Will RTO in 7 days for INR.  Will consider longer F/U if therapeutic at next visit.  No s/sx of bleeding, medication changes, or diet changes reported.  Continue Warfarin 10 mg daily.

## 2015-02-02 ENCOUNTER — Telehealth: Payer: Self-pay

## 2015-02-02 DIAGNOSIS — E875 Hyperkalemia: Secondary | ICD-10-CM

## 2015-02-02 NOTE — Telephone Encounter (Signed)
Nurse called patient, reached voicemail. Left message for patient to call Shonita Rinck with [facility], at [phone]. Patient needs lab appointment for potassium recheck.

## 2015-02-15 ENCOUNTER — Ambulatory Visit: Payer: Self-pay | Admitting: Oncology

## 2015-02-23 ENCOUNTER — Telehealth: Payer: Self-pay | Admitting: Pharmacist

## 2015-02-23 NOTE — Telephone Encounter (Signed)
Called patient to follow up on warfarin management. Patient had an appointment with me and did not show up for the appointment.  Patient reported that he has been busy. Stressed the importance of close INR monitoring due to risk of clot or bleeding. Patient agreed to visit next Tuesday, 03/01/15, for an INR check and warfarin dose adjustment if needed.   Nicoletta Ba, PharmD, BCPS, [facility] [phone]

## 2015-03-01 ENCOUNTER — Encounter: Payer: Self-pay | Admitting: Pharmacist

## 2015-03-03 ENCOUNTER — Other Ambulatory Visit: Payer: Self-pay | Admitting: Internal Medicine

## 2015-03-29 ENCOUNTER — Telehealth: Payer: Self-pay

## 2015-03-29 NOTE — Telephone Encounter (Signed)
-----   Message from Lance Bosch, NP sent at 03/28/2015  5:35 PM EDT ----- Please call him back and get him in here for recheck and he has missed his INR visit with stacy ----- Message -----    From: Nila Nephew, RN    Sent: 03/28/2015   3:23 PM      To: Lance Bosch, NP  I called this patient about the potassium recheck, left message in past. Order is in. Do ou want me to call him again to schedule appointment for potassium recheck?    ----- Message -----    From: Lance Bosch, NP    Sent: 01/18/2015   4:13 PM      To: Nila Nephew, RN  I am not sure if I told you tell him to come back in 1 week for a potassium recheck

## 2015-03-29 NOTE — Telephone Encounter (Signed)
Nurse called patient, patient verified date of birth. Patient agrees to make lab appointment for potassium recheck and appointment with [facility] for INR check.  Patient voices understanding and has no further questions at this time. Nurse transferred patient to front office to schedule appointment.

## 2015-04-05 ENCOUNTER — Other Ambulatory Visit: Payer: Self-pay

## 2015-04-05 ENCOUNTER — Telehealth: Payer: Self-pay | Admitting: Pharmacist

## 2015-04-05 ENCOUNTER — Encounter: Payer: Self-pay | Admitting: Pharmacist

## 2015-04-05 DIAGNOSIS — R569 Unspecified convulsions: Secondary | ICD-10-CM

## 2015-04-05 MED ORDER — LAMOTRIGINE ER 300 MG PO TB24: 1.0000 | ORAL_TABLET | Freq: Every morning | ORAL | Status: DC

## 2015-04-05 NOTE — Telephone Encounter (Signed)
Called patient to follow up after missed [facility]madin [facility] appointment.   Patient also missed lab appointment.  Both were rescheduled for 10/20 - patient verbalized understanding.  Patient also needs refills on lamotrigine - he reports that he has been taking it every other day to not run out. He reports that he has about 4 tablets left. Instructed patient to take tablet every day to prevent seizures.   Refill pending in Dr. Christa See inbox but will refill x 1 month to ensure patient does not run out. Will order 30 day supply and no refills and patient needs to schedule an appointment with Dr. Doreene Burke for follow up.

## 2015-04-07 ENCOUNTER — Other Ambulatory Visit: Payer: Self-pay

## 2015-04-07 ENCOUNTER — Ambulatory Visit: Payer: Self-pay | Admitting: Pharmacist

## 2015-04-12 ENCOUNTER — Other Ambulatory Visit: Payer: Self-pay

## 2015-04-12 ENCOUNTER — Encounter: Payer: Self-pay | Admitting: Pharmacist

## 2015-06-08 ENCOUNTER — Other Ambulatory Visit: Payer: Self-pay | Admitting: Internal Medicine

## 2015-06-08 MED ORDER — LAMOTRIGINE ER 300 MG PO TB24: 300.0000 mg | ORAL_TABLET | Freq: Every day | ORAL | Status: DC

## 2015-07-01 MED FILL — LISINOPRIL-HCTZ 10-12.5 MG: 10-12.5 | 30 days supply | Qty: 30 | Fill #3

## 2015-07-01 MED FILL — $LAMICTAL XR 300MG TABLET: 300 | 30 days supply | Qty: 30 | Fill #1

## 2015-08-05 MED FILL — WARFARIN SODIUM 5 MG TABLET: 5 | 30 days supply | Qty: 60 | Fill #3

## 2015-08-05 MED FILL — lamoTRIgine ER 300 MG TB24: 300 | 30 days supply | Qty: 30 | Fill #2

## 2015-08-05 MED FILL — LISINOPRIL-HCTZ 10-12.5 MG: 10-12.5 | 30 days supply | Qty: 30 | Fill #4

## 2015-08-24 ENCOUNTER — Other Ambulatory Visit: Payer: Self-pay | Admitting: Internal Medicine

## 2015-08-24 MED ORDER — LAMOTRIGINE ER 300 MG PO TB24: 300.0000 mg | ORAL_TABLET | Freq: Every day | ORAL | Status: DC

## 2015-09-06 ENCOUNTER — Other Ambulatory Visit: Payer: Self-pay | Admitting: Internal Medicine

## 2015-09-06 MED FILL — LISINOPRIL-HCTZ 10-12.5 MG: 10-12.5 | 30 days supply | Qty: 30 | Fill #5

## 2015-09-13 MED FILL — lamoTRIgine ER 300 MG TB24: 300 | 30 days supply | Qty: 30 | Fill #3

## 2015-09-15 ENCOUNTER — Ambulatory Visit: Payer: Self-pay | Attending: Internal Medicine | Admitting: Pharmacist

## 2015-09-15 DIAGNOSIS — R760 Raised antibody titer: Secondary | ICD-10-CM

## 2015-09-15 DIAGNOSIS — Z7901 Long term (current) use of anticoagulants: Secondary | ICD-10-CM

## 2015-09-15 LAB — POCT INR: INR: 1.1

## 2015-09-15 MED ORDER — WARFARIN SODIUM 10 MG PO TABS: 10.0000 mg | ORAL_TABLET | Freq: Every day | ORAL | Status: DC

## 2015-09-15 MED FILL — WARFARIN NA 10 MG TAB: 10 | 30 days supply | Qty: 30 | Fill #0

## 2015-09-15 NOTE — Progress Notes (Signed)
Patient has been noncompliant with warfarin monitoring. Stressed the importance of regular follow up to prevent clots/bleeding. Patient verbalized understanding but stated that it is hard to get out of work.   Patient will follow up next week for his appointment and has agreed to keep regular follow up.

## 2015-10-14 ENCOUNTER — Other Ambulatory Visit: Payer: Self-pay | Admitting: Internal Medicine

## 2015-10-24 ENCOUNTER — Ambulatory Visit: Payer: Self-pay | Attending: Family Medicine | Admitting: Family Medicine

## 2015-10-24 ENCOUNTER — Encounter: Payer: Self-pay | Admitting: Family Medicine

## 2015-10-24 VITALS — BP 150/102 | HR 92 | Temp 98.3°F | Resp 16 | Ht 72.0 in | Wt 287.0 lb

## 2015-10-24 DIAGNOSIS — N183 Chronic kidney disease, stage 3 unspecified: Secondary | ICD-10-CM | POA: Insufficient documentation

## 2015-10-24 DIAGNOSIS — I10 Essential (primary) hypertension: Secondary | ICD-10-CM

## 2015-10-24 DIAGNOSIS — I129 Hypertensive chronic kidney disease with stage 1 through stage 4 chronic kidney disease, or unspecified chronic kidney disease: Secondary | ICD-10-CM | POA: Insufficient documentation

## 2015-10-24 DIAGNOSIS — Z79899 Other long term (current) drug therapy: Secondary | ICD-10-CM | POA: Insufficient documentation

## 2015-10-24 DIAGNOSIS — R569 Unspecified convulsions: Secondary | ICD-10-CM | POA: Insufficient documentation

## 2015-10-24 DIAGNOSIS — I6789 Other cerebrovascular disease: Secondary | ICD-10-CM

## 2015-10-24 DIAGNOSIS — Z114 Encounter for screening for human immunodeficiency virus [HIV]: Secondary | ICD-10-CM

## 2015-10-24 DIAGNOSIS — Z7901 Long term (current) use of anticoagulants: Secondary | ICD-10-CM

## 2015-10-24 LAB — POCT INR: INR: 1.9

## 2015-10-24 MED ORDER — LAMOTRIGINE ER 300 MG PO TB24: 300.0000 mg | ORAL_TABLET | Freq: Every day | ORAL | Status: DC

## 2015-10-24 MED ORDER — WARFARIN SODIUM 10 MG PO TABS: 10.0000 mg | ORAL_TABLET | Freq: Every day | ORAL | Status: DC

## 2015-10-24 MED ORDER — HYDROCHLOROTHIAZIDE 25 MG PO TABS
25.0000 mg | ORAL_TABLET | Freq: Every day | ORAL | Status: DC
Start: 2015-10-24 — End: 2016-10-20

## 2015-10-24 MED FILL — ?HYDROCHLOROTHIAZIDE 25 MG: 25 MG | 30 days supply | Qty: 30 | Fill #0

## 2015-10-24 MED FILL — WARFARIN NA 10 MG TAB: 10 | 30 days supply | Qty: 30 | Fill #0

## 2015-10-24 MED FILL — lamoTRIgine ER 300 MG TB24: 300 | 30 days supply | Qty: 30 | Fill #0

## 2015-10-24 NOTE — Assessment & Plan Note (Signed)
Elevated  Med: non compliant P: Stop prinzide due to hyperkalemia last year Ordered HCTZ 25 mg daily BMP with GFR

## 2015-10-24 NOTE — Progress Notes (Signed)
F/U HTN  No BP medication x  2 days  No pain today  No suicidal thoughts in the past two weeks  Tobacco user 1-4 cigarette per day

## 2015-10-24 NOTE — Progress Notes (Signed)
Subjective:  Patient ID: [name] [name], male    DOB: 1981/02/13  Age: 35 y.o. MRN: [id number]  CC: Hypertension   HPI Kamran Robson has HTN, CKD, lupus anticoagulant, CVA, hx of seizure  presents for   1. CHRONIC HYPERTENSION  Disease Monitoring  Blood pressure range: not checking   Chest pain: no   Dyspnea: no   Claudication: no   Medication compliance: no, no meds for past 3 days , had headache. None now.  Medication Side Effects  Lightheadedness: no   Urinary frequency: no   Edema: no    2. CKD: has hyperkalemia last June, took kayexalate. He did not return for K+ check. He was previously treated by Dr. Nicole Kindred. He stopped going when he last health insurance. He may have insurance again in 2 months.  He is taking prinzide until 3 days ago. No leg swelling.   3. Seizures: last seizure in 11/2013. Taking Lamictal daily. Has absence type seizures.   Social History  Substance Use Topics  . Smoking status: Former Research scientist (life sciences)  . Smokeless tobacco: Never Used  . Alcohol Use: 0.6 oz/week    1 Cans of beer per week     Comment: occassional   Outpatient Prescriptions Prior to Visit  Medication Sig Dispense Refill  . LamoTRIgine (LAMICTAL XR) 300 MG TB24 Take 1 tablet (300 mg total) by mouth daily. 90 tablet 3  . lisinopril-hydrochlorothiazide (PRINZIDE,ZESTORETIC) 10-12.5 MG tablet Take 1 tablet by mouth daily. Must have office visit for refills 30 tablet 0  . warfarin (COUMADIN) 10 MG tablet Take 1 tablet (10 mg total) by mouth daily. 30 tablet 3  . sodium polystyrene (KIONEX) 15 GM/60ML suspension TAKE 15 GRAMS (60 ML) BY MOUTH EVERY 12 HOURS FOR A TOTAL OF 2 DOSES 120 mL 0   No facility-administered medications prior to visit.    ROS Review of Systems  Constitutional: Negative for fever, chills, fatigue and unexpected weight change.  Eyes: Negative for visual disturbance.  Respiratory: Negative for cough and shortness of breath.   Cardiovascular: Negative for chest pain,  palpitations and leg swelling.  Gastrointestinal: Negative for nausea, vomiting, abdominal pain, diarrhea, constipation and blood in stool.  Endocrine: Negative for polydipsia, polyphagia and polyuria.  Musculoskeletal: Negative for myalgias, back pain, arthralgias, gait problem and neck pain.  Skin: Negative for rash.  Allergic/Immunologic: Negative for immunocompromised state.  Neurological: Positive for headaches.  Hematological: Negative for adenopathy. Does not bruise/bleed easily.  Psychiatric/Behavioral: Negative for suicidal ideas, sleep disturbance and dysphoric mood. The patient is not nervous/anxious.    Objective:  BP 150/102 mmHg  Pulse 92  Temp(Src) 98.3 F (36.8 C)  Resp 16  Ht 6' (1.829 m)  Wt 287 lb (130.182 kg)  BMI 38.92 kg/m2  SpO2 100%  BP/Weight 10/24/2015 [id number]  Systolic BP [id number]  Diastolic BP A999333 70 75  Wt. (Lbs) 287 289 311.8  BMI 38.92 38.64 40.02   Physical Exam  Constitutional: He appears well-developed and well-nourished. No distress.  HENT:  Head: Normocephalic and atraumatic.  Neck: Normal range of motion. Neck supple.  Cardiovascular: Normal rate, regular rhythm, normal heart sounds and intact distal pulses.   Pulmonary/Chest: Effort normal and breath sounds normal.  Musculoskeletal: He exhibits no edema.  Neurological: He is alert.  Skin: Skin is warm and dry. No rash noted. No erythema.  Psychiatric: He has a normal mood and affect.   INR 1.9   Assessment & Plan:   There are no diagnoses linked  to this encounter. Fin was seen today for hypertension.  Diagnoses and all orders for this visit:  CKD (chronic kidney disease) stage 3, GFR 30-59 ml/min -     BASIC METABOLIC PANEL WITH GFR -     hydrochlorothiazide (HYDRODIURIL) 25 MG tablet; Take 1 tablet (25 mg total) by mouth daily. -     CBC  Essential hypertension -     BASIC METABOLIC PANEL WITH GFR -     hydrochlorothiazide (HYDRODIURIL) 25 MG tablet; Take 1  tablet (25 mg total) by mouth daily.  Chronic anticoagulation -     INR -     warfarin (COUMADIN) 10 MG tablet; Take 1 tablet (10 mg total) by mouth daily.  CVA -     INR -     warfarin (COUMADIN) 10 MG tablet; Take 1 tablet (10 mg total) by mouth daily.  Seizures (HCC) -     LamoTRIgine (LAMICTAL XR) 300 MG TB24; Take 1 tablet (300 mg total) by mouth daily.  Screening for HIV (human immunodeficiency virus) -     HIV antibody (with reflex)   Meds ordered this encounter  Medications  . hydrochlorothiazide (HYDRODIURIL) 25 MG tablet    Sig: Take 1 tablet (25 mg total) by mouth daily.    Dispense:  90 tablet    Refill:  3  . warfarin (COUMADIN) 10 MG tablet    Sig: Take 1 tablet (10 mg total) by mouth daily.    Dispense:  30 tablet    Refill:  3  . LamoTRIgine (LAMICTAL XR) 300 MG TB24    Sig: Take 1 tablet (300 mg total) by mouth daily.    Dispense:  90 tablet    Refill:  3    Follow-up: No Follow-up on file.   Boykin Nearing MD

## 2015-10-24 NOTE — Assessment & Plan Note (Signed)
Hx of seizure Seizure free x 2 years Driving Refilled Lamictal

## 2015-10-24 NOTE — Assessment & Plan Note (Addendum)
CKD in setting of HTN  Check BMP with GFR  Nephrology referral if GFR < 30

## 2015-10-24 NOTE — Patient Instructions (Addendum)
[name] [name] was seen today for hypertension.  Diagnoses and all orders for this visit:  CKD (chronic kidney disease) stage 3, GFR 30-59 ml/min -     BASIC METABOLIC PANEL WITH GFR -     hydrochlorothiazide (HYDRODIURIL) 25 MG tablet; Take 1 tablet (25 mg total) by mouth daily. -     CBC  Essential hypertension -     BASIC METABOLIC PANEL WITH GFR -     hydrochlorothiazide (HYDRODIURIL) 25 MG tablet; Take 1 tablet (25 mg total) by mouth daily.  Chronic anticoagulation -     INR -     warfarin (COUMADIN) 10 MG tablet; Take 1 tablet (10 mg total) by mouth daily.  CVA -     INR -     warfarin (COUMADIN) 10 MG tablet; Take 1 tablet (10 mg total) by mouth daily.  Seizures (HCC) -     LamoTRIgine (LAMICTAL XR) 300 MG TB24; Take 1 tablet (300 mg total) by mouth daily.  Screening for HIV (human immunodeficiency virus) -     HIV antibody (with reflex)   Low out cost optometrist (about $65.00 for office visit)  1. Dr. Thurston Hole Phone # [phone] [address]  [place], [place] [address]   2. [facility]  Phone # [phone] [address] [place], [place] [address]   F/u in 3-4 weeks for BP check   Dr. Adrian Blackwater

## 2015-10-25 ENCOUNTER — Telehealth: Payer: Self-pay | Admitting: *Deleted

## 2015-10-25 LAB — BASIC METABOLIC PANEL WITH GFR
BUN: 21 mg/dL (ref 7–25)
CALCIUM: 8.8 mg/dL (ref 8.6–10.3)
CO2: 27 mmol/L (ref 20–31)
Chloride: 106 mmol/L (ref 98–110)
Creat: 2.06 mg/dL — ABNORMAL HIGH (ref 0.60–1.35)
GFR, EST AFRICAN AMERICAN: 47 mL/min — AB (ref >=60)
GFR, EST NON AFRICAN AMERICAN: 40 mL/min — AB (ref >=60)
Glucose, Bld: 76 mg/dL (ref 65–99)
Potassium: 4.3 mmol/L (ref 3.5–5.3)
SODIUM: 141 mmol/L (ref 135–146)

## 2015-10-25 LAB — CBC
HEMATOCRIT: 43.3 % (ref 38.5–50.0)
HEMOGLOBIN: 14.6 g/dL (ref 13.2–17.1)
MCH: 29.9 pg (ref 27.0–33.0)
MCHC: 33.7 g/dL (ref 32.0–36.0)
MCV: 88.7 fL (ref 80.0–100.0)
MPV: 10.6 fL (ref 7.5–12.5)
Platelets: 148 10*3/uL (ref 140–400)
RBC: 4.88 MIL/uL (ref 4.20–5.80)
RDW: 14 % (ref 11.0–15.0)
WBC: 7 10*3/uL (ref 3.8–10.8)

## 2015-10-25 LAB — HIV ANTIBODY (ROUTINE TESTING W REFLEX): HIV: NONREACTIVE

## 2015-10-25 NOTE — Telephone Encounter (Signed)
-----   Message from Boykin Nearing, MD sent at 10/25/2015  8:29 AM EDT ----- Kidney function has improved slightly Normal potassium Keep tight BP control to help improve kidney function  All other labs normal

## 2015-10-25 NOTE — Telephone Encounter (Signed)
Pt. Returned call. Please f/u °

## 2015-10-25 NOTE — Telephone Encounter (Signed)
LVM to return call.

## 2015-10-27 NOTE — Telephone Encounter (Signed)
Pt. Returned call. Please f/u with pt. °

## 2015-10-28 NOTE — Telephone Encounter (Signed)
Date of Birth verified by pt  Lab results given  Requesting HIV lab results printed  Advised to return to clinic sign a medical record release  Pt verbalized understanding

## 2015-11-15 ENCOUNTER — Encounter: Payer: Self-pay | Admitting: Pharmacist

## 2015-11-30 MED FILL — WARFARIN NA 10 MG TAB: 10 | 30 days supply | Qty: 30 | Fill #1

## 2015-11-30 MED FILL — HYDROCHLOROTHIAZIDE 25 MG T: 25 | 30 days supply | Qty: 30 | Fill #1

## 2015-11-30 MED FILL — lamoTRIgine ER 300 MG TB24: 300 | 30 days supply | Qty: 30 | Fill #1

## 2016-01-13 MED FILL — lamoTRIgine ER 300 MG TB24: 300 | 30 days supply | Qty: 30 | Fill #2

## 2016-01-13 MED FILL — HYDROCHLOROTHIAZIDE 25 MG T: 25 | 30 days supply | Qty: 30 | Fill #2

## 2016-02-17 MED FILL — lamoTRIgine ER 300 MG TB24: 300 | 30 days supply | Qty: 30 | Fill #3

## 2016-02-28 MED FILL — WARFARIN NA 10 MG TAB: 10 | 30 days supply | Qty: 30 | Fill #2

## 2016-02-28 MED FILL — HYDROCHLOROTHIAZIDE 25 MG T: 25 | 30 days supply | Qty: 30 | Fill #3

## 2016-03-30 MED FILL — $LAMICTAL XR 300MG TABLET: 300 | 30 days supply | Qty: 30 | Fill #4

## 2016-04-18 MED FILL — HYDROCHLOROTHIAZIDE 25 MG T: 25 | 30 days supply | Qty: 30 | Fill #4

## 2016-04-18 MED FILL — WARFARIN NA 10 MG TAB: 10 | 30 days supply | Qty: 30 | Fill #3

## 2016-05-14 MED FILL — $LAMICTAL XR 300MG TABLET: 300 | 30 days supply | Qty: 30 | Fill #5

## 2016-05-14 MED FILL — HYDROCHLOROTHIAZIDE 25 MG T: 25 | 30 days supply | Qty: 30 | Fill #5

## 2016-05-14 MED FILL — WARFARIN NA 10 MG TAB: 10 | 30 days supply | Qty: 30 | Fill #1

## 2016-06-07 MED FILL — HYDROCHLOROTHIAZIDE 25 MG T: 25 | 30 days supply | Qty: 30 | Fill #6

## 2016-06-07 MED FILL — $LAMICTAL XR 300MG TABLET: 300 | 90 days supply | Qty: 90 | Fill #0

## 2016-06-07 MED FILL — WARFARIN NA 10 MG TAB: 10 | 30 days supply | Qty: 30 | Fill #2

## 2016-06-08 DIAGNOSIS — H04123 Dry eye syndrome of bilateral lacrimal glands: Secondary | ICD-10-CM | POA: Diagnosis not present

## 2016-06-08 DIAGNOSIS — H40033 Anatomical narrow angle, bilateral: Secondary | ICD-10-CM | POA: Diagnosis not present

## 2016-07-06 DIAGNOSIS — G43009 Migraine without aura, not intractable, without status migrainosus: Secondary | ICD-10-CM | POA: Diagnosis not present

## 2016-07-06 DIAGNOSIS — G40209 Localization-related (focal) (partial) symptomatic epilepsy and epileptic syndromes with complex partial seizures, not intractable, without status epilepticus: Secondary | ICD-10-CM | POA: Diagnosis not present

## 2016-08-08 MED FILL — HYDROCHLOROTHIAZIDE 25 MG T: 25 | 30 days supply | Qty: 30 | Fill #7

## 2016-08-08 MED FILL — WARFARIN NA 10 MG TAB: 10 | 30 days supply | Qty: 30 | Fill #3

## 2016-09-26 ENCOUNTER — Other Ambulatory Visit: Payer: Self-pay | Admitting: Internal Medicine

## 2016-09-26 MED FILL — HYDROCHLOROTHIAZIDE 25 MG T: 25 | 30 days supply | Qty: 30 | Fill #8

## 2016-09-27 MED FILL — lamoTRIgine ER 300 MG TB24: 300 | 30 days supply | Qty: 30 | Fill #0

## 2016-09-28 ENCOUNTER — Telehealth: Payer: Self-pay | Admitting: Internal Medicine

## 2016-09-28 NOTE — Telephone Encounter (Signed)
Pt came in requesting a refill on his Warfarin. Please f/u

## 2016-10-01 NOTE — Telephone Encounter (Signed)
Patient hasn't been seen in almost a year. I do not feel comfortable refilling warfarin until he is seen in office.

## 2016-10-01 NOTE — Telephone Encounter (Signed)
I agree. We need to know current INR before warfarin refill

## 2016-10-02 NOTE — Telephone Encounter (Signed)
Called pt. And LVM to return call and schedule an appt. To establish care before he can get a refill on medication.

## 2016-10-02 NOTE — Telephone Encounter (Signed)
Patient needs to be scheduled to reestablish care in order to receive refill.

## 2016-10-19 ENCOUNTER — Emergency Department (HOSPITAL_COMMUNITY)
Admission: EM | Admit: 2016-10-19 | Discharge: 2016-10-20 | Disposition: A | Discharge status: Home / Self Care | Payer: Self-pay | Attending: Emergency Medicine | Admitting: Emergency Medicine

## 2016-10-19 ENCOUNTER — Encounter (HOSPITAL_COMMUNITY): Payer: Self-pay

## 2016-10-19 DIAGNOSIS — I129 Hypertensive chronic kidney disease with stage 1 through stage 4 chronic kidney disease, or unspecified chronic kidney disease: Secondary | ICD-10-CM | POA: Insufficient documentation

## 2016-10-19 DIAGNOSIS — Z8673 Personal history of transient ischemic attack (TIA), and cerebral infarction without residual deficits: Secondary | ICD-10-CM | POA: Insufficient documentation

## 2016-10-19 DIAGNOSIS — Z7901 Long term (current) use of anticoagulants: Secondary | ICD-10-CM | POA: Insufficient documentation

## 2016-10-19 DIAGNOSIS — Z87891 Personal history of nicotine dependence: Secondary | ICD-10-CM | POA: Insufficient documentation

## 2016-10-19 DIAGNOSIS — N183 Chronic kidney disease, stage 3 unspecified: Secondary | ICD-10-CM

## 2016-10-19 DIAGNOSIS — G44209 Tension-type headache, unspecified, not intractable: Secondary | ICD-10-CM | POA: Insufficient documentation

## 2016-10-19 DIAGNOSIS — I10 Essential (primary) hypertension: Secondary | ICD-10-CM

## 2016-10-19 NOTE — ED Triage Notes (Signed)
Pt c/o headache and slight dizziness starting today at work. He is also requesting a refill of Coumadin. His last dose was last week. A&Ox4. Ambulatory.

## 2016-10-19 NOTE — ED Provider Notes (Signed)
[facility] DEPT Provider Note   CSN: [id number] Arrival date & time: 10/19/16  2014   By signing my name below, I, Soijett Blue, attest that this documentation has been prepared under the direction and in the presence of Ripley Fraise, MD. Electronically Signed: Soijett Blue, ED Scribe. 10/19/16. 12:09 AM.  History   Chief Complaint Chief Complaint  Patient presents with  . Headache  . Medication Refill    HPI [name] [name] is a 36 y.o. male with a PMHx of HTN, lupus, seizures, stroke, who presents to the Emergency Department complaining of resolved HA onset this morning. Pt reports resolved associated dizziness. Pt has not tried any medications for the relief of his symptoms. He states that he was rushing this morning and didn't take a dose of his HCTZ this morning prior to the onset of his symptoms. Pt notes that he has a hx of similar symptoms. Denies fever, vomiting, vision loss, vision change, CP, SOB, abdominal pain, weakness, numbness, recent fall, recent head injury, and any other symptoms.   He also is requesting a medication refill of 10 mg coumadin that he has been taking daily since 2014 due to a stroke. Pt reports that he has 6 tablets of coumadin left and denies having heart valve replacement. Denies blood in stool and any other symptoms.     The history is provided by the patient. No language interpreter was used.  Headache   This is a recurrent problem. The current episode started 12 to 24 hours ago. The problem occurs every few hours. The problem has been resolved. The headache is associated with nothing. The patient is experiencing no pain. The pain does not radiate. Pertinent negatives include no fever, no shortness of breath and no vomiting. He has tried nothing for the symptoms. The treatment provided no relief.  Medication Refill  Medications/supplies requested:  10 mg coumadin Reason for request:  Clinic/provider not available Medications taken before: yes -  see home medications   Patient has complete original prescription information: yes     Past Medical History:  Diagnosis Date  . Hypertension   . Lupus   . Seizures ([place])   . Seizures ([place])   . Stroke ([place])   . Stroke [facility])    55 or 36 years of age.      Patient Active Problem List   Diagnosis Date Noted  . CKD (chronic kidney disease) stage 3, GFR 30-59 ml/min 10/24/2015  . Seizures ([place]) 10/24/2015  . HTN (hypertension) 01/12/2015  . Chronic anticoagulation 04/24/2011  . Lupus anticoagulant positive 04/24/2011  . OBESITY, NOS 08/15/2006  . THROMBOCYTOPENIA 08/15/2006  . TOBACCO DEPENDENCE 08/15/2006  . CVA 08/15/2006    Past Surgical History:  Procedure Laterality Date  . RENAL BIOPSY         Home Medications    Prior to Admission medications   Medication Sig Start Date End Date Taking? Authorizing Provider  hydrochlorothiazide (HYDRODIURIL) 25 MG tablet Take 1 tablet (25 mg total) by mouth daily. 10/24/15  Yes Funches, Adriana Mccallum, MD  LamoTRIgine (LAMICTAL XR) 300 MG TB24 Take 1 tablet (300 mg total) by mouth daily. 10/24/15  Yes Funches, Josalyn, MD  warfarin (COUMADIN) 10 MG tablet Take 1 tablet (10 mg total) by mouth daily. 10/24/15  Yes Boykin Nearing, MD    Family History Family History  Problem Relation Age of Onset  . Diabetes Mother   . Diabetes Other     Social History Social History  Substance Use Topics  .  Smoking status: Former Research scientist (life sciences)  . Smokeless tobacco: Never Used  . Alcohol use 0.6 oz/week    1 Cans of beer per week     Comment: occassional     Allergies   Patient has no known allergies.   Review of Systems Review of Systems  Constitutional: Negative for fever.  Eyes: Negative for visual disturbance.  Respiratory: Negative for shortness of breath.   Cardiovascular: Negative for chest pain.  Gastrointestinal: Negative for abdominal pain, blood in stool and vomiting.  Neurological: Positive for dizziness and headaches. Negative for  weakness and numbness.  All other systems reviewed and are negative.    Physical Exam Updated Vital Signs BP (!) 166/106 (BP Location: Left Arm)   Pulse 86   Temp 98.9 F (37.2 C) (Oral)   Resp 20   Wt 299 lb 9.6 oz (135.9 kg)   SpO2 99%   BMI 40.63 kg/m   Physical Exam CONSTITUTIONAL: Well developed/well nourished HEAD: Normocephalic/atraumatic EYES: EOMI/PERRL, no nystagmus, no ptosis,  ENMT: Mucous membranes moist NECK: supple no meningeal signs, no bruits SPINE/BACK:entire spine nontender CV: S1/S2 noted, no murmurs/rubs/gallops noted LUNGS: Lungs are clear to auscultation bilaterally, no apparent distress ABDOMEN: soft, nontender, no rebound or guarding GU:no cva tenderness NEURO:Awake/alert, face symmetric, no arm or leg drift is noted Equal 5/5 strength with shoulder abduction, elbow flex/extension, wrist flex/extension in upper extremities and equal hand grips bilaterally Equal 5/5 strength with hip flexion,knee flex/extension, foot dorsi/plantar flexion Cranial nerves 3/4/5/6/12/24/08/11/12 tested and intact Gait normal without ataxia No past pointing Sensation to light touch intact in all extremities EXTREMITIES: pulses normal, full ROM SKIN: warm, color normal PSYCH: no abnormalities of mood noted, alert and oriented to situation    ED Treatments / Results  DIAGNOSTIC STUDIES: Oxygen Saturation is 99% on RA, nl by my interpretation.    COORDINATION OF CARE: 12:01 AM Discussed treatment plan with pt at bedside which includes labs and pt agreed to plan.   Labs (all labs ordered are listed, but only abnormal results are displayed) Labs Reviewed  BASIC METABOLIC PANEL - Abnormal; Notable for the following:       Result Value   BUN 27 (*)    Creatinine, Ser 2.57 (*)    GFR calc non Af Amer 30 (*)    GFR calc Af Amer 35 (*)    All other components within normal limits  CBC WITH DIFFERENTIAL/PLATELET - Abnormal; Notable for the following:    Platelets 143  (*)    All other components within normal limits  PROTIME-INR    EKG  EKG Interpretation None       Radiology No results found.  Procedures Procedures (including critical care time)  Medications Ordered in ED Medications  warfarin (COUMADIN) tablet 10 mg (not administered)  hydrochlorothiazide (MICROZIDE) capsule 25 mg (25 mg Oral Given 10/20/16 0209)     Initial Impression / Assessment and Plan / ED Course  I have reviewed the triage vital signs and the nursing notes.  Pertinent labs  results that were available during my care of the patient were reviewed by me and considered in my medical decision making (see chart for details).     PT RESTING COMFORTABLY REPORTS HA/DIZZINESS RESOLVED HE IS AMBULATORY HE HAS H/O SEIZURES (NONE RECENTLY) AS WELL AS LUPUS AND STROKE AND SUPPOSED TO BE ON COUMADIN HE IS MOSTLY HERE FOR MED REFILL AS HE HAS MISSED PCP VISITS RECENTLY SHORT COURSE OF COUMADIN GIVEN (HE HAS BEEN TAKING THIS INTERMITTENTLY) AND  ALSO HCTZ GIVEN (HE HAS BEEN ON THIS PREVIOUSLY) HE HAD SOME MILD WORSENING OF RENAL FUNCTION  I STRESSED IMPORTANCE OF CLOSE PCP FOLLOWUP NEXT WEEK FOR INR CHECK AND BLOOD PRESSURE CHECK PT WITHOUT FOCAL WEAKNESS OR NEURO DEFICITS AT THIS TIME  Final Clinical Impressions(s) / ED Diagnoses   Final diagnoses:  Essential hypertension  Acute non intractable tension-type headache  Stage 3 chronic kidney disease    New Prescriptions New Prescriptions   HYDROCHLOROTHIAZIDE (HYDRODIURIL) 25 MG TABLET    Take 1 tablet (25 mg total) by mouth daily.   WARFARIN (COUMADIN) 5 MG TABLET    Take two tablets daily for next 3 days then followup with coumadin level check with your doctor   I personally performed the services described in this documentation, which was scribed in my presence. The recorded information has been reviewed and is accurate.        Ripley Fraise, MD 10/20/16 [phone]

## 2016-10-20 LAB — CBC WITH DIFFERENTIAL/PLATELET
Basophils Absolute: 0 10*3/uL (ref 0.0–0.1)
Basophils Relative: 0 %
Eosinophils Absolute: 0.2 10*3/uL (ref 0.0–0.7)
Eosinophils Relative: 4 %
HCT: 42.3 % (ref 39.0–52.0)
Hemoglobin: 14.7 g/dL (ref 13.0–17.0)
LYMPHS ABS: 1.9 10*3/uL (ref 0.7–4.0)
Lymphocytes Relative: 33 %
MCH: 31.6 pg (ref 26.0–34.0)
MCHC: 34.8 g/dL (ref 30.0–36.0)
MCV: 91 fL (ref 78.0–100.0)
MONO ABS: 0.7 10*3/uL (ref 0.1–1.0)
MONOS PCT: 11 %
NEUTROS PCT: 52 %
Neutro Abs: 3 10*3/uL (ref 1.7–7.7)
PLATELETS: 143 10*3/uL — AB (ref 150–400)
RBC: 4.65 MIL/uL (ref 4.22–5.81)
RDW: 12.8 % (ref 11.5–15.5)
WBC: 5.8 10*3/uL (ref 4.0–10.5)

## 2016-10-20 LAB — BASIC METABOLIC PANEL
Anion gap: 8 (ref 5–15)
BUN: 27 mg/dL — AB (ref 6–20)
CALCIUM: 9.1 mg/dL (ref 8.9–10.3)
CO2: 26 mmol/L (ref 22–32)
CREATININE: 2.57 mg/dL — AB (ref 0.61–1.24)
Chloride: 105 mmol/L (ref 101–111)
GFR, EST AFRICAN AMERICAN: 35 mL/min — AB (ref >60)
GFR, EST NON AFRICAN AMERICAN: 30 mL/min — AB (ref >60)
Glucose, Bld: 96 mg/dL (ref 65–99)
Potassium: 3.7 mmol/L (ref 3.5–5.1)
Sodium: 139 mmol/L (ref 135–145)

## 2016-10-20 LAB — PROTIME-INR
INR: 1.08
Prothrombin Time: 14.1 seconds (ref 11.4–15.2)

## 2016-10-20 MED ORDER — HYDROCHLOROTHIAZIDE 25 MG PO TABS: 25.0000 mg | ORAL_TABLET | Freq: Every day | ORAL | 0 refills | Status: DC

## 2016-10-20 MED ORDER — WARFARIN SODIUM 10 MG PO TABS
10.0000 mg | ORAL_TABLET | Freq: Once | ORAL | Status: AC
  Administered 2016-10-20: 10 mg via ORAL
  Filled 2016-10-20: qty 1

## 2016-10-20 MED ORDER — HYDROCHLOROTHIAZIDE 12.5 MG PO CAPS
25.0000 mg | ORAL_CAPSULE | Freq: Once | ORAL | Status: AC
  Administered 2016-10-20: 25 mg via ORAL
  Filled 2016-10-20: qty 2

## 2016-10-20 MED ORDER — WARFARIN SODIUM 5 MG PO TABS: ORAL_TABLET | ORAL | 0 refills | Status: DC

## 2016-10-30 ENCOUNTER — Other Ambulatory Visit: Payer: Self-pay | Admitting: Internal Medicine

## 2016-10-30 ENCOUNTER — Other Ambulatory Visit: Payer: Self-pay | Admitting: Family Medicine

## 2016-10-30 DIAGNOSIS — N183 Chronic kidney disease, stage 3 unspecified: Secondary | ICD-10-CM

## 2016-10-30 DIAGNOSIS — I10 Essential (primary) hypertension: Secondary | ICD-10-CM

## 2016-10-30 DIAGNOSIS — R569 Unspecified convulsions: Secondary | ICD-10-CM

## 2016-11-02 ENCOUNTER — Other Ambulatory Visit: Payer: Self-pay

## 2016-11-02 DIAGNOSIS — R569 Unspecified convulsions: Secondary | ICD-10-CM

## 2016-11-02 MED ORDER — LAMOTRIGINE ER 300 MG PO TB24: 300.0000 mg | ORAL_TABLET | Freq: Every day | ORAL | 3 refills | Status: DC

## 2016-11-05 MED FILL — lamoTRIgine ER 300 MG TB24: 300 | 30 days supply | Qty: 30 | Fill #0

## 2016-12-10 ENCOUNTER — Encounter: Payer: Self-pay | Admitting: Family Medicine

## 2016-12-10 ENCOUNTER — Ambulatory Visit: Payer: BLUE CROSS/BLUE SHIELD | Attending: Family Medicine | Admitting: Family Medicine

## 2016-12-10 VITALS — BP 151/99 | HR 71 | Temp 98.3°F | Resp 18 | Ht 69.0 in | Wt 294.4 lb

## 2016-12-10 DIAGNOSIS — M329 Systemic lupus erythematosus, unspecified: Secondary | ICD-10-CM | POA: Diagnosis not present

## 2016-12-10 DIAGNOSIS — N183 Chronic kidney disease, stage 3 unspecified: Secondary | ICD-10-CM

## 2016-12-10 DIAGNOSIS — Z91199 Patient's noncompliance with other medical treatment and regimen due to unspecified reason: Secondary | ICD-10-CM

## 2016-12-10 DIAGNOSIS — I129 Hypertensive chronic kidney disease with stage 1 through stage 4 chronic kidney disease, or unspecified chronic kidney disease: Secondary | ICD-10-CM | POA: Diagnosis not present

## 2016-12-10 DIAGNOSIS — M7989 Other specified soft tissue disorders: Secondary | ICD-10-CM | POA: Insufficient documentation

## 2016-12-10 DIAGNOSIS — IMO0002 Reserved for concepts with insufficient information to code with codable children: Secondary | ICD-10-CM

## 2016-12-10 DIAGNOSIS — Z7901 Long term (current) use of anticoagulants: Secondary | ICD-10-CM | POA: Insufficient documentation

## 2016-12-10 DIAGNOSIS — Z9119 Patient's noncompliance with other medical treatment and regimen: Secondary | ICD-10-CM

## 2016-12-10 DIAGNOSIS — G40909 Epilepsy, unspecified, not intractable, without status epilepticus: Secondary | ICD-10-CM | POA: Insufficient documentation

## 2016-12-10 DIAGNOSIS — Z6830 Body mass index (BMI) 30.0-30.9, adult: Secondary | ICD-10-CM | POA: Diagnosis not present

## 2016-12-10 DIAGNOSIS — Z8739 Personal history of other diseases of the musculoskeletal system and connective tissue: Secondary | ICD-10-CM

## 2016-12-10 DIAGNOSIS — I10 Essential (primary) hypertension: Secondary | ICD-10-CM

## 2016-12-10 DIAGNOSIS — F17200 Nicotine dependence, unspecified, uncomplicated: Secondary | ICD-10-CM

## 2016-12-10 DIAGNOSIS — E669 Obesity, unspecified: Secondary | ICD-10-CM | POA: Insufficient documentation

## 2016-12-10 DIAGNOSIS — Z8673 Personal history of transient ischemic attack (TIA), and cerebral infarction without residual deficits: Secondary | ICD-10-CM | POA: Insufficient documentation

## 2016-12-10 LAB — POCT UA - MICROALBUMIN
CREATININE, POC: 200 mg/dL
Microalbumin Ur, POC: 150 mg/L

## 2016-12-10 LAB — PROTIME-INR: INR: 1 (ref 0.9–1.1)

## 2016-12-10 MED ORDER — AMLODIPINE BESYLATE 5 MG PO TABS: 5.0000 mg | ORAL_TABLET | Freq: Every day | ORAL | 2 refills | Status: DC

## 2016-12-10 MED ORDER — RIVAROXABAN 20 MG PO TABS: 20.0000 mg | ORAL_TABLET | Freq: Every day | ORAL | 2 refills | Status: DC

## 2016-12-10 MED ORDER — NICOTINE POLACRILEX 4 MG MT GUM: 4.0000 mg | CHEWING_GUM | OROMUCOSAL | 0 refills | Status: DC | PRN

## 2016-12-10 MED FILL — lamoTRIgine ER 300 MG TB24: 300 | 30 days supply | Qty: 30 | Fill #1

## 2016-12-10 MED FILL — AMLODIPINE BESYLATE 5 MG TA: 5 | 30 days supply | Qty: 30 | Fill #0

## 2016-12-10 MED FILL — XARELTO 20 MG TABLET: 20 | 30 days supply | Qty: 30 | Fill #0

## 2016-12-10 NOTE — Patient Instructions (Addendum)
Amlodipine tablets What is this medicine? AMLODIPINE (am LOE di peen) is a calcium-channel blocker. It affects the amount of calcium found in your heart and muscle cells. This relaxes your blood vessels, which can reduce the amount of work the heart has to do. This medicine is used to lower high blood pressure. It is also used to prevent chest pain. This medicine may be used for other purposes; ask your health care provider or pharmacist if you have questions. COMMON BRAND NAME(S): Norvasc What should I tell my health care provider before I take this medicine? They need to know if you have any of these conditions: -heart problems like heart failure or aortic stenosis -liver disease -an unusual or allergic reaction to amlodipine, other medicines, foods, dyes, or preservatives -pregnant or trying to get pregnant -breast-feeding How should I use this medicine? Take this medicine by mouth with a glass of water. Follow the directions on the prescription label. Take your medicine at regular intervals. Do not take more medicine than directed. Talk to your pediatrician regarding the use of this medicine in children. Special care may be needed. This medicine has been used in children as young as 6. Persons over 26 years old may have a stronger reaction to this medicine and need smaller doses. Overdosage: If you think you have taken too much of this medicine contact a poison control center or emergency room at once. NOTE: This medicine is only for you. Do not share this medicine with others. What if I miss a dose? If you miss a dose, take it as soon as you can. If it is almost time for your next dose, take only that dose. Do not take double or extra doses. What may interact with this medicine? -herbal or dietary supplements -local or general anesthetics -medicines for high blood pressure -medicines for prostate problems -rifampin This list may not describe all possible interactions. Give your health  care provider a list of all the medicines, herbs, non-prescription drugs, or dietary supplements you use. Also tell them if you smoke, drink alcohol, or use illegal drugs. Some items may interact with your medicine. What should I watch for while using this medicine? Visit your doctor or health care professional for regular check ups. Check your blood pressure and pulse rate regularly. Ask your health care professional what your blood pressure and pulse rate should be, and when you should contact him or her. This medicine may make you feel confused, dizzy or lightheaded. Do not drive, use machinery, or do anything that needs mental alertness until you know how this medicine affects you. To reduce the risk of dizzy or fainting spells, do not sit or stand up quickly, especially if you are an older patient. Avoid alcoholic drinks; they can make you more dizzy. Do not suddenly stop taking amlodipine. Ask your doctor or health care professional how you can gradually reduce the dose. What side effects may I notice from receiving this medicine? Side effects that you should report to your doctor or health care professional as soon as possible: -allergic reactions like skin rash, itching or hives, swelling of the face, lips, or tongue -breathing problems -changes in vision or hearing -chest pain -fast, irregular heartbeat -swelling of legs or ankles Side effects that usually do not require medical attention (report to your doctor or health care professional if they continue or are bothersome): -dry mouth -facial flushing -nausea, vomiting -stomach gas, pain -tired, weak -trouble sleeping This list may not describe all possible side  effects. Call your doctor for medical advice about side effects. You may report side effects to FDA at 1-800-FDA-1088. Where should I keep my medicine? Keep out of the reach of children. Store at room temperature between 59 and 86 degrees F (15 and 30 degrees C). Protect from  light. Keep container tightly closed. Throw away any unused medicine after the expiration date. NOTE: This sheet is a summary. It may not cover all possible information. If you have questions about this medicine, talk to your doctor, pharmacist, or health care provider.  2018 Elsevier/Gold Standard (2012-05-02 11:40:58)    Rivaroxaban oral tablets What is this medicine? RIVAROXABAN (ri va ROX a ban) is an anticoagulant (blood thinner). It is used to treat blood clots in the lungs or in the veins. It is also used after knee or hip surgeries to prevent blood clots. It is also used to lower the chance of stroke in people with a medical condition called atrial fibrillation. This medicine may be used for other purposes; ask your health care provider or pharmacist if you have questions. COMMON BRAND NAME(S): Xarelto, Xarelto Starter Pack What should I tell my health care provider before I take this medicine? They need to know if you have any of these conditions: -bleeding disorders -bleeding in the brain -blood in your stools (black or tarry stools) or if you have blood in your vomit -history of stomach bleeding -kidney disease -liver disease -low blood counts, like low white cell, platelet, or red cell counts -recent or planned spinal or epidural procedure -take medicines that treat or prevent blood clots -an unusual or allergic reaction to rivaroxaban, other medicines, foods, dyes, or preservatives -pregnant or trying to get pregnant -breast-feeding How should I use this medicine? Take this medicine by mouth with a glass of water. Follow the directions on the prescription label. Take your medicine at regular intervals. Do not take it more often than directed. Do not stop taking except on your doctor's advice. Stopping this medicine may increase your risk of a blood clot. Be sure to refill your prescription before you run out of medicine. If you are taking this medicine after hip or knee  replacement surgery, take it with or without food. If you are taking this medicine for atrial fibrillation, take it with your evening meal. If you are taking this medicine to treat blood clots, take it with food at the same time each day. If you are unable to swallow your tablet, you may crush the tablet and mix it in applesauce. Then, immediately eat the applesauce. You should eat more food right after you eat the applesauce containing the crushed tablet. Talk to your pediatrician regarding the use of this medicine in children. Special care may be needed. Overdosage: If you think you have taken too much of this medicine contact a poison control center or emergency room at once. NOTE: This medicine is only for you. Do not share this medicine with others. What if I miss a dose? If you take your medicine once a day and miss a dose, take the missed dose as soon as you remember. If you take your medicine twice a day and miss a dose, take the missed dose immediately. In this instance, 2 tablets may be taken at the same time. The next day you should take 1 tablet twice a day as directed. What may interact with this medicine? Do not take this medicine with any of the following medications: -defibrotide This medicine may also interact with  the following medications: -aspirin and aspirin-like medicines -certain antibiotics like erythromycin, azithromycin, and clarithromycin -certain medicines for fungal infections like ketoconazole and itraconazole -certain medicines for irregular heart beat like amiodarone, quinidine, dronedarone -certain medicines for seizures like carbamazepine, phenytoin -certain medicines that treat or prevent blood clots like warfarin, enoxaparin, and dalteparin -conivaptan -diltiazem -felodipine -indinavir -lopinavir; ritonavir -NSAIDS, medicines for pain and inflammation, like ibuprofen or naproxen -ranolazine -rifampin -ritonavir -SNRIs, medicines for depression, like  desvenlafaxine, duloxetine, levomilnacipran, venlafaxine -SSRIs, medicines for depression, like citalopram, escitalopram, fluoxetine, fluvoxamine, paroxetine, sertraline -St. John's wort -verapamil This list may not describe all possible interactions. Give your health care provider a list of all the medicines, herbs, non-prescription drugs, or dietary supplements you use. Also tell them if you smoke, drink alcohol, or use illegal drugs. Some items may interact with your medicine. What should I watch for while using this medicine? Visit your doctor or health care professional for regular checks on your progress. Notify your doctor or health care professional and seek emergency treatment if you develop breathing problems; changes in vision; chest pain; severe, sudden headache; pain, swelling, warmth in the leg; trouble speaking; sudden numbness or weakness of the face, arm or leg. These can be signs that your condition has gotten worse. If you are going to have surgery or other procedure, tell your doctor that you are taking this medicine. What side effects may I notice from receiving this medicine? Side effects that you should report to your doctor or health care professional as soon as possible: -allergic reactions like skin rash, itching or hives, swelling of the face, lips, or tongue -back pain -redness, blistering, peeling or loosening of the skin, including inside the mouth -signs and symptoms of bleeding such as bloody or black, tarry stools; red or dark-brown urine; spitting up blood or brown material that looks like coffee grounds; red spots on the skin; unusual bruising or bleeding from the eye, gums, or nose Side effects that usually do not require medical attention (report to your doctor or health care professional if they continue or are bothersome): -dizziness -muscle pain This list may not describe all possible side effects. Call your doctor for medical advice about side effects. You  may report side effects to FDA at 1-800-FDA-1088. Where should I keep my medicine? Keep out of the reach of children. Store at room temperature between 15 and 30 degrees C (59 and 86 degrees F). Throw away any unused medicine after the expiration date. NOTE: This sheet is a summary. It may not cover all possible information. If you have questions about this medicine, talk to your doctor, pharmacist, or health care provider.  2018 Elsevier/Gold Standard (2016-02-22 16:29:33)

## 2016-12-10 NOTE — Progress Notes (Signed)
Patient is here for HTN  Patient has swollen legs & Right heel pain   Patient been with out  BP meds a month

## 2016-12-10 NOTE — Progress Notes (Signed)
Subjective:  Patient ID: [name] [name], male    DOB: [birth year]  Age: 36 y.o. MRN: [id number]  CC: Hypertension   HPI [name] [name] presents for medication refills. PMH for hypertension, CVA, CKD, seizure disorder, and chronic anticoagulation. He reports being without his medication for 1 month.  He is not exercising and is not adherent to low salt diet. He does  Cardiac symptoms none. Patient denies chest pain, chest pressure/discomfort, claudication, dyspnea, near-syncope, orthopnea, palpitations and syncope.  He does reports swelling of the ankles. Denies any chronic cough, SOB, or orthopnea. Cardiovascular risk factors: hypertension, male gender, microalbuminuria, obesity (BMI >= 30 kg/m2), sedentary lifestyle and smoking/ tobacco exposure. Use of agents associated with hypertension: none. History of target organ damage: chronic kidney disease and stroke. He denies any residual effects from stroke. history of seizure disorder he reports last seizure episode was in 2016.  Long term anti-coagulation use with warfarin. He denies any recent bruising or bleeding. History on non-adherence with medications and routine appointment follow up.    Outpatient Medications Prior to Visit  Medication Sig Dispense Refill  . LamoTRIgine (LAMICTAL XR) 300 MG TB24 Take 1 tablet (300 mg total) by mouth daily. 90 tablet 3  . hydrochlorothiazide (HYDRODIURIL) 25 MG tablet Take 1 tablet (25 mg total) by mouth daily. (Patient not taking: Reported on 12/10/2016) 10 tablet 0  . warfarin (COUMADIN) 5 MG tablet Take two tablets daily for next 3 days then followup with coumadin level check with your doctor (Patient not taking: Reported on 12/10/2016) 6 tablet 0   No facility-administered medications prior to visit.     ROS Review of Systems  Constitutional: Negative.   Eyes: Negative.   Respiratory: Negative.   Cardiovascular: Negative.   Gastrointestinal: Negative.   Skin: Negative.   Psychiatric/Behavioral:  Negative.    Objective:  BP (!) 151/99 (BP Location: Left Arm, Patient Position: Sitting, Cuff Size: Normal)   Pulse 71   Temp 98.3 F (36.8 C) (Oral)   Resp 18   Ht _0  (1.753 m)   Wt 294 lb 6.4 oz (133.5 kg)   SpO2 98%   BMI 43.48 kg/m   BP/Weight 12/10/2016 12/24/2954 07/19/3084  Systolic BP 578 469 629  Diastolic BP 99 79 528  Wt. (Lbs) 294.4 299 287  BMI 43.48 40.55 38.92    Physical Exam  Constitutional: He is oriented to person, place, and time. He appears well-developed and well-nourished.  Eyes: Conjunctivae are normal. Pupils are equal, round, and reactive to light.  Neck: No JVD present.  Cardiovascular: Normal rate, regular rhythm, normal heart sounds and intact distal pulses.   Pulmonary/Chest: Effort normal and breath sounds normal.  Abdominal: Soft. Bowel sounds are normal.  Neurological: He is alert and oriented to person, place, and time. He has normal reflexes.  Skin: Skin is warm and dry.  Psychiatric: He has a normal mood and affect.  Nursing note and vitals reviewed.  Assessment & Plan:   Problem List Items Addressed This Visit      Cardiovascular and Mediastinum   HTN (hypertension) - Primary (Chronic)   Follow up with clinical pharmacist in 2 weeks.   Follow up with PCP in 8 weeks   Relevant Medications   rivaroxaban (XARELTO) 20 MG TABS tablet   amLODipine (NORVASC) 5 MG tablet   Other Relevant Orders   POCT UA - Microalbumin (Completed)   CMP14+EGFR   Lipid Panel     Genitourinary   CKD (chronic kidney disease) stage 3,  GFR 30-59 ml/min (Chronic)   Relevant Orders   Ambulatory referral to Nephrology    Other Visit Diagnoses    Non-adherence to medical treatment       Non-adherent with daily warfarin use and routine monitoring, will d/c warfarin and order rivaroxaban.    Ready to quit smoking       Relevant Medications   nicotine polacrilex (NICORETTE) 4 MG gum   Warfarin anticoagulation       Relevant Orders   Protime-INR (Completed)    Anticoagulated       Relevant Medications   rivaroxaban (XARELTO) 20 MG TABS tablet   History of lupus       Relevant Orders   Ambulatory referral to Rheumatology      Meds ordered this encounter  Medications  . nicotine polacrilex (NICORETTE) 4 MG gum    Sig: Take 1 each (4 mg total) by mouth as needed for smoking cessation.    Dispense:  100 tablet    Refill:  0    Order Specific Question:   Supervising Provider    Answer:   Tresa Garter [id number]  . rivaroxaban (XARELTO) 20 MG TABS tablet    Sig: Take 1 tablet (20 mg total) by mouth daily with supper.    Dispense:  30 tablet    Refill:  2    Order Specific Question:   Supervising Provider    Answer:   Tresa Garter [id number]  . amLODipine (NORVASC) 5 MG tablet    Sig: Take 1 tablet (5 mg total) by mouth daily.    Dispense:  30 tablet    Refill:  2    Order Specific Question:   Supervising Provider    Answer:   Tresa Garter [id number]    Follow-up: Return in about 2 weeks (around 12/24/2016) for BP and Anticoagulation with Stacy .   Alfonse Spruce FNP

## 2016-12-11 LAB — CMP14+EGFR
ALT: 20 IU/L (ref 0–44)
AST: 20 IU/L (ref 0–40)
Albumin/Globulin Ratio: 1 — ABNORMAL LOW (ref 1.2–2.2)
Albumin: 3.2 g/dL — ABNORMAL LOW (ref 3.5–5.5)
Alkaline Phosphatase: 86 IU/L (ref 39–117)
BUN/Creatinine Ratio: 9 (ref 9–20)
BUN: 21 mg/dL — AB (ref 6–20)
Bilirubin Total: 0.3 mg/dL (ref 0.0–1.2)
CALCIUM: 8.3 mg/dL — AB (ref 8.7–10.2)
CHLORIDE: 107 mmol/L — AB (ref 96–106)
CO2: 22 mmol/L (ref 20–29)
Creatinine, Ser: 2.46 mg/dL — ABNORMAL HIGH (ref 0.76–1.27)
GFR, EST AFRICAN AMERICAN: 37 mL/min/{1.73_m2} — AB (ref >59)
GFR, EST NON AFRICAN AMERICAN: 32 mL/min/{1.73_m2} — AB (ref >59)
GLUCOSE: 85 mg/dL (ref 65–99)
Globulin, Total: 3.1 g/dL (ref 1.5–4.5)
Potassium: 4.2 mmol/L (ref 3.5–5.2)
Sodium: 139 mmol/L (ref 134–144)
TOTAL PROTEIN: 6.3 g/dL (ref 6.0–8.5)

## 2016-12-11 LAB — LIPID PANEL
CHOL/HDL RATIO: 4.9 ratio (ref 0.0–5.0)
Cholesterol, Total: 221 mg/dL — ABNORMAL HIGH (ref 100–199)
HDL: 45 mg/dL (ref >39)
LDL Calculated: 149 mg/dL — ABNORMAL HIGH (ref 0–99)
Triglycerides: 135 mg/dL (ref 0–149)
VLDL CHOLESTEROL CAL: 27 mg/dL (ref 5–40)

## 2016-12-14 ENCOUNTER — Other Ambulatory Visit: Payer: Self-pay | Admitting: Family Medicine

## 2016-12-14 ENCOUNTER — Telehealth: Payer: Self-pay

## 2016-12-14 DIAGNOSIS — N183 Chronic kidney disease, stage 3 unspecified: Secondary | ICD-10-CM

## 2016-12-14 DIAGNOSIS — E782 Mixed hyperlipidemia: Secondary | ICD-10-CM

## 2016-12-14 MED ORDER — ATORVASTATIN CALCIUM 40 MG PO TABS: 40.0000 mg | ORAL_TABLET | Freq: Every day | ORAL | 2 refills | Status: DC

## 2016-12-14 NOTE — Telephone Encounter (Signed)
-----   Message from Alfonse Spruce, [place] sent at 12/14/2016  8:15 AM EDT ----- -Lipid levels were elevated. This can increase your risk of heart disease. You will be prescribed atorvastatin to help lower levels. -Kidney function is stable since last check but is still decreased. Levels indicate you have chronic kidney disease stage 3. You will be referred to nephrology. -Continue to take your medications for blood pressure, avoid taking NSAID medications, reduce salt intake to 2 to 4 grams/day, stop smoking. Recommend follow up in 3 months.

## 2016-12-14 NOTE — Telephone Encounter (Signed)
CMA call regarding lab results   Patient Verify DOB   Patient was aware and understood  

## 2016-12-26 ENCOUNTER — Emergency Department (HOSPITAL_COMMUNITY): Payer: BLUE CROSS/BLUE SHIELD

## 2016-12-26 ENCOUNTER — Encounter (HOSPITAL_COMMUNITY): Payer: Self-pay | Admitting: Emergency Medicine

## 2016-12-26 ENCOUNTER — Ambulatory Visit (HOSPITAL_BASED_OUTPATIENT_CLINIC_OR_DEPARTMENT_OTHER): Payer: BLUE CROSS/BLUE SHIELD | Admitting: Pharmacist

## 2016-12-26 ENCOUNTER — Emergency Department (HOSPITAL_COMMUNITY)
Admission: EM | Admit: 2016-12-26 | Discharge: 2016-12-26 | Disposition: A | Discharge status: Home / Self Care | Payer: BLUE CROSS/BLUE SHIELD | Attending: Emergency Medicine | Admitting: Emergency Medicine

## 2016-12-26 VITALS — BP 140/99 | HR 82

## 2016-12-26 DIAGNOSIS — Z8673 Personal history of transient ischemic attack (TIA), and cerebral infarction without residual deficits: Secondary | ICD-10-CM | POA: Insufficient documentation

## 2016-12-26 DIAGNOSIS — I129 Hypertensive chronic kidney disease with stage 1 through stage 4 chronic kidney disease, or unspecified chronic kidney disease: Secondary | ICD-10-CM | POA: Insufficient documentation

## 2016-12-26 DIAGNOSIS — S00512A Abrasion of oral cavity, initial encounter: Secondary | ICD-10-CM | POA: Diagnosis not present

## 2016-12-26 DIAGNOSIS — Z79899 Other long term (current) drug therapy: Secondary | ICD-10-CM | POA: Insufficient documentation

## 2016-12-26 DIAGNOSIS — M79602 Pain in left arm: Secondary | ICD-10-CM | POA: Diagnosis not present

## 2016-12-26 DIAGNOSIS — S4992XA Unspecified injury of left shoulder and upper arm, initial encounter: Secondary | ICD-10-CM | POA: Diagnosis not present

## 2016-12-26 DIAGNOSIS — R569 Unspecified convulsions: Secondary | ICD-10-CM | POA: Insufficient documentation

## 2016-12-26 DIAGNOSIS — I10 Essential (primary) hypertension: Secondary | ICD-10-CM

## 2016-12-26 DIAGNOSIS — Z87891 Personal history of nicotine dependence: Secondary | ICD-10-CM | POA: Insufficient documentation

## 2016-12-26 DIAGNOSIS — Z7901 Long term (current) use of anticoagulants: Secondary | ICD-10-CM

## 2016-12-26 DIAGNOSIS — M79601 Pain in right arm: Secondary | ICD-10-CM | POA: Diagnosis not present

## 2016-12-26 DIAGNOSIS — N183 Chronic kidney disease, stage 3 (moderate): Secondary | ICD-10-CM | POA: Insufficient documentation

## 2016-12-26 DIAGNOSIS — S4991XA Unspecified injury of right shoulder and upper arm, initial encounter: Secondary | ICD-10-CM | POA: Diagnosis not present

## 2016-12-26 DIAGNOSIS — G40909 Epilepsy, unspecified, not intractable, without status epilepticus: Secondary | ICD-10-CM | POA: Diagnosis not present

## 2016-12-26 LAB — BASIC METABOLIC PANEL
Anion gap: 8 (ref 5–15)
BUN: 23 mg/dL — AB (ref 6–20)
CHLORIDE: 106 mmol/L (ref 101–111)
CO2: 19 mmol/L — AB (ref 22–32)
Calcium: 8.8 mg/dL — ABNORMAL LOW (ref 8.9–10.3)
Creatinine, Ser: 2.92 mg/dL — ABNORMAL HIGH (ref 0.61–1.24)
GFR calc Af Amer: 30 mL/min — ABNORMAL LOW (ref >60)
GFR calc non Af Amer: 26 mL/min — ABNORMAL LOW (ref >60)
GLUCOSE: 125 mg/dL — AB (ref 65–99)
POTASSIUM: 3.8 mmol/L (ref 3.5–5.1)
Sodium: 133 mmol/L — ABNORMAL LOW (ref 135–145)

## 2016-12-26 MED ORDER — LAMOTRIGINE 150 MG PO TABS
300.0000 mg | ORAL_TABLET | Freq: Once | ORAL | Status: AC
  Administered 2016-12-26: 300 mg via ORAL
  Filled 2016-12-26: qty 2

## 2016-12-26 MED ORDER — ACETAMINOPHEN 325 MG PO TABS
650.0000 mg | ORAL_TABLET | Freq: Once | ORAL | Status: AC
  Administered 2016-12-26: 650 mg via ORAL
  Filled 2016-12-26: qty 2

## 2016-12-26 MED ORDER — LAMOTRIGINE ER 300 MG PO TB24: 300.0000 mg | ORAL_TABLET | Freq: Every day | ORAL | 0 refills | Status: DC

## 2016-12-26 NOTE — ED Provider Notes (Signed)
[facility] DEPT Provider Note   CSN: [id number] Arrival date & time: 12/26/16  1357     History   Chief Complaint Chief Complaint  Patient presents with  . Seizures  . Shoulder Pain    HPI [name] [name] is a 36 y.o. male with history of seizure disorder, hypertension, CVA, CK D stage III who presents following seizure today. It was a witnessed seizure. Patient was just talking with a friend and began having full body convulsive seizure. Patient fell and hit his face and right arm on the table. Patient now reports bilateral right upper arm pain. He denies shoulder pain. Patient was postictal for about 15-20 minutes, however has returned to baseline. He denies any headaches or other symptoms besides his arm pain and mild lip pain where he hit his face. Patient notes that he does typically have some upper arm pain after his seizures, however he has worsening pain on the right, especially where he fell and hit his arm. Patient reports he is not taking his Lamictal for the past 2 days. Patient also reports he recently changed from Coumadin to Xarelto for his previous CVA. Patient denies any chest pain, shortness of breath, abdominal pain, nausea, vomiting.  HPI  Past Medical History:  Diagnosis Date  . Hypertension   . Lupus   . Seizures ([place])   . Seizures ([place])   . Stroke ([place])   . Stroke [facility])    42 or 36 years of age.      Patient Active Problem List   Diagnosis Date Noted  . CKD (chronic kidney disease) stage 3, GFR 30-59 ml/min 10/24/2015  . Seizures ([place]) 10/24/2015  . HTN (hypertension) 01/12/2015  . Chronic anticoagulation 04/24/2011  . Lupus anticoagulant positive 04/24/2011  . OBESITY, NOS 08/15/2006  . THROMBOCYTOPENIA 08/15/2006  . TOBACCO DEPENDENCE 08/15/2006  . CVA 08/15/2006    Past Surgical History:  Procedure Laterality Date  . RENAL BIOPSY      OB History    No data available       Home Medications    Prior to Admission medications     Medication Sig Start Date End Date Taking? Authorizing Provider  amLODipine (NORVASC) 5 MG tablet Take 1 tablet (5 mg total) by mouth daily. 12/10/16   Alfonse Spruce, FNP  atorvastatin (LIPITOR) 40 MG tablet Take 1 tablet (40 mg total) by mouth daily. 12/14/16   Alfonse Spruce, FNP  LamoTRIgine (LAMICTAL XR) 300 MG TB24 Take 1 tablet (300 mg total) by mouth daily. 12/26/16   Shanae Luo, Bea Graff, PA-C  nicotine polacrilex (NICORETTE) 4 MG gum Take 1 each (4 mg total) by mouth as needed for smoking cessation. 12/10/16   Alfonse Spruce, FNP  rivaroxaban (XARELTO) 20 MG TABS tablet Take 1 tablet (20 mg total) by mouth daily with supper. 12/10/16   Alfonse Spruce, FNP    Family History Family History  Problem Relation Age of Onset  . Diabetes Mother   . Diabetes Other     Social History Social History  Substance Use Topics  . Smoking status: Former Research scientist (life sciences)  . Smokeless tobacco: Never Used  . Alcohol use 0.6 oz/week    1 Cans of beer per week     Comment: occassional     Allergies   Patient has no known allergies.   Review of Systems Review of Systems  Constitutional: Negative for chills and fever.  HENT: Negative for facial swelling and sore throat.   Respiratory: Negative for  shortness of breath.   Cardiovascular: Negative for chest pain.  Gastrointestinal: Negative for abdominal pain, nausea and vomiting.  Genitourinary: Negative for dysuria.  Musculoskeletal: Positive for myalgias. Negative for back pain.  Skin: Negative for rash and wound.  Neurological: Positive for seizures. Negative for headaches.  Psychiatric/Behavioral: The patient is not nervous/anxious.      Physical Exam Updated Vital Signs BP (!) 147/93   Pulse 70   Temp 98.1 F (36.7 C) (Oral)   Resp (!) 23   Ht 6\' 1"  (1.854 m)   Wt 120.2 kg (265 lb)   SpO2 100%   BMI 34.96 kg/m   Physical Exam  Constitutional: He appears well-developed and well-nourished. No distress.  HENT:   Head: Normocephalic and atraumatic.  Mouth/Throat: Oropharynx is clear and moist. No oropharyngeal exudate.  Mild swelling of the right upper lip, mild abrasion to mucosa, no significant laceration or active bleeding  Eyes: Conjunctivae and EOM are normal. Pupils are equal, round, and reactive to light. Right eye exhibits no discharge. Left eye exhibits no discharge. No scleral icterus.  Neck: Normal range of motion. Neck supple. No thyromegaly present.  Cardiovascular: Normal rate, regular rhythm, normal heart sounds and intact distal pulses.  Exam reveals no gallop and no friction rub.   No murmur heard. Pulmonary/Chest: Effort normal and breath sounds normal. No stridor. No respiratory distress. He has no wheezes. He has no rales.  Abdominal: Soft. Bowel sounds are normal. He exhibits no distension. There is no tenderness. There is no rebound and no guarding.  Musculoskeletal: He exhibits no edema.       Right shoulder: He exhibits no bony tenderness.       Left shoulder: He exhibits no bony tenderness.       Arms: Lymphadenopathy:    He has no cervical adenopathy.  Neurological: He is alert. Coordination normal.  CN 3-12 intact; normal sensation throughout; 5/5 strength in all 4 extremities; equal bilateral grip strength  Skin: Skin is warm and dry. No rash noted. He is not diaphoretic. No pallor.  Psychiatric: He has a normal mood and affect.  Nursing note and vitals reviewed.    ED Treatments / Results  Labs (all labs ordered are listed, but only abnormal results are displayed) Labs Reviewed  BASIC METABOLIC PANEL - Abnormal; Notable for the following:       Result Value   Sodium 133 (*)    CO2 19 (*)    Glucose, Bld 125 (*)    BUN 23 (*)    Creatinine, Ser 2.92 (*)    Calcium 8.8 (*)    GFR calc non Af Amer 26 (*)    GFR calc Af Amer 30 (*)    All other components within normal limits  CBG MONITORING, ED    EKG  EKG Interpretation None       Radiology Ct Head  Wo Contrast  Result Date: 12/26/2016 CLINICAL DATA:  Seizure EXAM: CT HEAD WITHOUT CONTRAST TECHNIQUE: Contiguous axial images were obtained from the base of the skull through the vertex without intravenous contrast. COMPARISON:  MRI brain dated 07/05/2010 FINDINGS: Brain: No evidence of acute infarction, hemorrhage, hydrocephalus, extra-axial collection or mass lesion/mass effect. Encephalomalacic changes related to old left frontoparietal infarct and right parietal infarct. Old left caudate lacunar infarct. Subcortical white matter and periventricular small vessel ischemic changes. Vascular: No hyperdense vessel or unexpected calcification. Skull: Normal. Negative for fracture or focal lesion. Sinuses/Orbits: Partial opacification of the left maxillary sinus. Other: None.  IMPRESSION: No evidence of acute intracranial abnormality. Old left frontal parietal and right parietal infarcts. Old left caudate lacunar infarct. Small vessel ischemic changes, Age advanced. Electronically Signed   By: [name] Hy M.D.   On: 12/26/2016 17:40   Dg Humerus Left  Result Date: 12/26/2016 CLINICAL DATA:  Status post seizure and trauma to the arms. EXAM: LEFT HUMERUS - 2+ VIEW COMPARISON:  None. FINDINGS: There is no evidence of fracture or other focal bone lesions. Soft tissues are unremarkable. IMPRESSION: Negative. Electronically Signed   By: Fidela Salisbury M.D.   On: 12/26/2016 17:36   Dg Humerus Right  Result Date: 12/26/2016 CLINICAL DATA:  Status post seizure and trauma to the arms. EXAM: RIGHT HUMERUS - 2+ VIEW COMPARISON:  None. FINDINGS: There is no evidence of fracture or other focal bone lesions. Soft tissues are unremarkable. IMPRESSION: Negative. Electronically Signed   By: Fidela Salisbury M.D.   On: 12/26/2016 17:36    Procedures Procedures (including critical care time)  Medications Ordered in ED Medications  acetaminophen (TYLENOL) tablet 650 mg (650 mg Oral Given 12/26/16 1834)    lamoTRIgine (LAMICTAL) tablet 300 mg (300 mg Oral Given 12/26/16 1835)     Initial Impression / Assessment and Plan / ED Course  I have reviewed the triage vital signs and the nursing notes.  Pertinent labs & imaging results that were available during my care of the patient were reviewed by me and considered in my medical decision making (see chart for details).     Patient with seizure today and back to baseline. Patient most likely had seizure due to being without his Lamictal for the past 2 days because he misplaced it. He has found the medication, but did not take it today. Electrolytes are stable. CT head shows no acute findings. Creatinine is slightly elevated from past. I offered patient IV fluids here in ED, however he is pushing to leave. I advised patient to increase oral fluids and have creatinine rechecked next week. Bilateral humerus x-rays negative. I feel patient's pain is due to musculature spasm after seizure activity. Patient states he has had this in the past. Patient given Tylenol in the ED. Patient also given his dose of Lamictal. Patient also advised to follow-up with neurologist as soon as possible. He is advised not to drive for 6 months of being seizure free. Discharged home with refill of Lamictal. Patient understands and agrees with plan. Patient vitals stable throughout ED course and discharged in satisfactory condition. I discussed patient case with Dr. Oleta Mouse who guided the patient's management and agrees with plan.   Final Clinical Impressions(s) / ED Diagnoses   Final diagnoses:  Seizure ([place])  Bilateral arm pain    New Prescriptions Current Discharge Medication List       Caryl Ada 12/26/16 Jasmine Pang, MD 12/26/16 2340

## 2016-12-26 NOTE — Progress Notes (Signed)
   S:    Patient arrives in good spirits.    Presents to the clinic for hypertension evaluation. Patient was referred on 12/10/16.  Patient was last seen by Primary Care Provider on 12/10/16.   Patient denies adherence with medications. He missed two doses of his Xarelto over the last week because he left it at work and he has not taken his amlodipine today. He usually takes amlodipine in the morning and last dose >24 hours ago.  Current BP Medications include:  Amlodipine 5 mg daily.  Of note, patient is also on Xarelto. Denies any s/sx of bleeding.   O:   Last 3 Office BP readings: BP Readings from Last 3 Encounters:  12/10/16 (!) 151/99  10/20/16 130/79  10/24/15 (!) 150/102    BMET    Component Value Date/Time   NA 139 12/10/2016 1646   K 4.2 12/10/2016 1646   CL 107 (H) 12/10/2016 1646   CO2 22 12/10/2016 1646   GLUCOSE 85 12/10/2016 1646   GLUCOSE 96 10/20/2016 0202   BUN 21 (H) 12/10/2016 1646   CREATININE 2.46 (H) 12/10/2016 1646   CREATININE 2.06 (H) 10/24/2015 1603   CALCIUM 8.3 (L) 12/10/2016 1646   GFRNONAA 32 (L) 12/10/2016 1646   GFRNONAA 40 (L) 10/24/2015 1603   GFRAA 37 (L) 12/10/2016 1646   GFRAA 47 (L) 10/24/2015 1603    A/P: Hypertension longstanding currently uncontrolled on current medications due to not taking dose today.  Continued amlodipine as prescribed.  Chronic anticoagulation on Xarelto. History if noncompliance with warfarin and INR monitoring in the past. Denies any s/sx of bleeding. Renal function is stable but we will need to continue to closely monitor incase it drops   Results reviewed and written information provided.   Total time in face-to-face counseling 15 minutes.   F/U Clinic Visit with [facility]

## 2016-12-26 NOTE — Patient Instructions (Signed)
Thanks for coming to see me  Take your amlodipine every morning  Do not miss any doses of the Xarelto as it can increase your risk of a blood clot.  Come back and see me for a blood pressure check in 1-2 weeks

## 2016-12-26 NOTE — ED Triage Notes (Signed)
Friend stated, he witnessed him having a seizure about 30 minutes ago.  Pt. Stated, he hurt his shoulder and bit his lip.  He has not taken his medication in 2 days.

## 2016-12-26 NOTE — ED Notes (Signed)
Pt instructed to follow up about BP with family dr

## 2016-12-26 NOTE — Discharge Instructions (Signed)
You can take Tylenol as prescribed over-the-counter as needed for your muscle pain. You also use ice 3-4 times daily alternating 20 minutes on, 20 minutes off. Please follow-up with your primary care provider or kidney doctor next week for recheck of your creatinine. Please see your neurologist as soon as possible for follow-up of today's visit. Do not drive, operate machinery, swim, or climb to high heights for 6 months due to your seizure today. Please resume taking your Lamictal. Increase your fluid intake. Please return to the emergency department if you develop any new or worsening symptoms.

## 2017-01-09 ENCOUNTER — Telehealth: Payer: Self-pay | Admitting: Family Medicine

## 2017-01-09 ENCOUNTER — Encounter: Payer: BLUE CROSS/BLUE SHIELD | Admitting: Pharmacist

## 2017-01-09 NOTE — Telephone Encounter (Signed)
[facility] called stating that pt. Has not showed up for his appt. And will no longer reschedule.

## 2017-01-09 NOTE — Telephone Encounter (Signed)
[facility] called stating that pt. Has not showed up for his appt. And will no longer reschedule.

## 2017-01-09 NOTE — Telephone Encounter (Signed)
Thank You.

## 2017-03-14 ENCOUNTER — Telehealth: Payer: Self-pay

## 2017-03-14 MED FILL — lamoTRIgine ER 300 MG TB24: 300 | 30 days supply | Qty: 30 | Fill #2

## 2017-03-14 NOTE — Telephone Encounter (Signed)
Pt called and states that the Xarelto that he is currently on is making him bleed to much. Pt states that he would like to go back to coumadin.

## 2017-03-15 ENCOUNTER — Telehealth: Payer: Self-pay | Admitting: Family Medicine

## 2017-03-15 NOTE — Telephone Encounter (Signed)
Patient called and was informed of note in the system, Pt was informed he would need to schedule an appointment. Pt states he will call back

## 2017-03-15 NOTE — Telephone Encounter (Signed)
Patient need to schedule an office visit to address. With coumadin requires frequent monitoring to ensure levels are therapeutic. Recommend scheduling office visit for first available appointment with PCP or clinical pharmacist whichever is sooner. If patient notices any black tarry stools, bloody stools, throwing up coffee ground like vomit, bloody vomit, or coughing up blood, he needs to go to the Emergency Department.

## 2017-03-15 NOTE — Telephone Encounter (Signed)
[facility] call regarding patient requesting medication changes   Patient did not answer but left a detailed message of what he needs to do & if have any questions just to call back

## 2017-03-15 NOTE — Telephone Encounter (Signed)
Pt called and states that the Xarelto that he is currently on is making him bleed to much. Pt states that he would like to go back to coumadin.

## 2017-03-18 MED FILL — AMLODIPINE BESYLATE 5 MG TA: 5 | 30 days supply | Qty: 30 | Fill #1

## 2017-03-26 ENCOUNTER — Ambulatory Visit: Payer: BLUE CROSS/BLUE SHIELD | Admitting: Family Medicine

## 2017-04-01 ENCOUNTER — Ambulatory Visit: Payer: BLUE CROSS/BLUE SHIELD

## 2017-04-22 ENCOUNTER — Ambulatory Visit: Payer: BLUE CROSS/BLUE SHIELD | Admitting: Family Medicine

## 2017-04-22 ENCOUNTER — Other Ambulatory Visit: Payer: Self-pay | Admitting: Internal Medicine

## 2017-04-22 MED FILL — lamoTRIgine ER 300 MG TB24: 300 | 30 days supply | Qty: 30 | Fill #3

## 2017-04-22 MED FILL — AMLODIPINE BESYLATE 5 MG TA: 5 | 30 days supply | Qty: 30 | Fill #2

## 2017-04-26 ENCOUNTER — Other Ambulatory Visit: Payer: Self-pay | Admitting: Internal Medicine

## 2017-05-13 ENCOUNTER — Ambulatory Visit: Payer: BLUE CROSS/BLUE SHIELD | Admitting: Family Medicine

## 2017-05-30 ENCOUNTER — Other Ambulatory Visit: Payer: Self-pay | Admitting: Family Medicine

## 2017-05-30 DIAGNOSIS — I10 Essential (primary) hypertension: Secondary | ICD-10-CM

## 2017-05-30 MED FILL — AMLODIPINE BESYLATE 5 MG TA: 5 | 30 days supply | Qty: 30 | Fill #0

## 2017-05-30 MED FILL — lamoTRIgine ER 300 MG TB24: 300 | 30 days supply | Qty: 30 | Fill #4

## 2017-06-03 NOTE — Telephone Encounter (Signed)
[facility] call regarding medication refill   Patient did not answer but left a VM stating there reason  of the call & to call back if have any questions

## 2017-06-04 ENCOUNTER — Ambulatory Visit: Payer: BLUE CROSS/BLUE SHIELD | Attending: Family Medicine | Admitting: Pharmacist

## 2017-06-04 DIAGNOSIS — R760 Raised antibody titer: Secondary | ICD-10-CM

## 2017-06-04 DIAGNOSIS — Z7901 Long term (current) use of anticoagulants: Secondary | ICD-10-CM

## 2017-06-04 NOTE — Progress Notes (Signed)
Patient is not on warfarin but would like to switch. He does not like the way that the Xarelto makes him feel and he does not want to pay for it.  Told patient that he would have to come weekly at first for INR visits and he reported that he would do this. He has an extensive history of no-shows, including to previous Coumadin Clinic visits and to see PCP. I do not feel comfortable switching him to warfarin without him discussing with PCP. I would worry about the risk of bleeding if he refuses to regularly follow up, which is what he did in the past. Patient will make appt to discuss with PCP.  Encouraged patient to take Xarelto until he is seen but he reported that he will not take it anymore. Discussed the risks of not taking it with patient (DVT/PE/Stroke) and he verbalized understanding.

## 2017-06-12 ENCOUNTER — Ambulatory Visit: Payer: BLUE CROSS/BLUE SHIELD | Admitting: Family Medicine

## 2017-06-19 ENCOUNTER — Other Ambulatory Visit: Payer: BLUE CROSS/BLUE SHIELD

## 2017-06-28 ENCOUNTER — Encounter: Payer: BLUE CROSS/BLUE SHIELD | Admitting: Family Medicine

## 2017-06-29 ENCOUNTER — Emergency Department (HOSPITAL_COMMUNITY)
Admission: EM | Admit: 2017-06-29 | Discharge: 2017-06-29 | Disposition: A | Discharge status: Home / Self Care | Payer: BLUE CROSS/BLUE SHIELD | Attending: Emergency Medicine | Admitting: Emergency Medicine

## 2017-06-29 ENCOUNTER — Encounter (HOSPITAL_COMMUNITY): Payer: Self-pay | Admitting: Emergency Medicine

## 2017-06-29 ENCOUNTER — Emergency Department (HOSPITAL_COMMUNITY): Payer: BLUE CROSS/BLUE SHIELD

## 2017-06-29 DIAGNOSIS — R1110 Vomiting, unspecified: Secondary | ICD-10-CM | POA: Diagnosis not present

## 2017-06-29 DIAGNOSIS — N183 Chronic kidney disease, stage 3 (moderate): Secondary | ICD-10-CM | POA: Insufficient documentation

## 2017-06-29 DIAGNOSIS — R0789 Other chest pain: Secondary | ICD-10-CM | POA: Diagnosis not present

## 2017-06-29 DIAGNOSIS — R05 Cough: Secondary | ICD-10-CM

## 2017-06-29 DIAGNOSIS — I129 Hypertensive chronic kidney disease with stage 1 through stage 4 chronic kidney disease, or unspecified chronic kidney disease: Secondary | ICD-10-CM | POA: Diagnosis not present

## 2017-06-29 DIAGNOSIS — J069 Acute upper respiratory infection, unspecified: Secondary | ICD-10-CM | POA: Diagnosis not present

## 2017-06-29 DIAGNOSIS — I10 Essential (primary) hypertension: Secondary | ICD-10-CM | POA: Diagnosis not present

## 2017-06-29 DIAGNOSIS — Z79899 Other long term (current) drug therapy: Secondary | ICD-10-CM | POA: Diagnosis not present

## 2017-06-29 DIAGNOSIS — Z87891 Personal history of nicotine dependence: Secondary | ICD-10-CM | POA: Diagnosis not present

## 2017-06-29 DIAGNOSIS — J029 Acute pharyngitis, unspecified: Secondary | ICD-10-CM | POA: Diagnosis not present

## 2017-06-29 DIAGNOSIS — R059 Cough, unspecified: Secondary | ICD-10-CM

## 2017-06-29 DIAGNOSIS — Z720 Tobacco use: Secondary | ICD-10-CM

## 2017-06-29 MED ORDER — ALBUTEROL SULFATE HFA 108 (90 BASE) MCG/ACT IN AERS: 1.0000 | INHALATION_SPRAY | RESPIRATORY_TRACT | 0 refills | Status: DC | PRN

## 2017-06-29 MED ORDER — ALBUTEROL SULFATE HFA 108 (90 BASE) MCG/ACT IN AERS
2.0000 | INHALATION_SPRAY | Freq: Once | RESPIRATORY_TRACT | Status: AC
  Administered 2017-06-29: 2 via RESPIRATORY_TRACT
  Filled 2017-06-29: qty 6.7

## 2017-06-29 MED ORDER — IPRATROPIUM BROMIDE 0.02 % IN SOLN
0.5000 mg | Freq: Once | RESPIRATORY_TRACT | Status: AC
  Administered 2017-06-29: 0.5 mg via RESPIRATORY_TRACT
  Filled 2017-06-29: qty 2.5

## 2017-06-29 MED ORDER — ALBUTEROL SULFATE (2.5 MG/3ML) 0.083% IN NEBU
5.0000 mg | INHALATION_SOLUTION | Freq: Once | RESPIRATORY_TRACT | Status: AC
  Administered 2017-06-29: 5 mg via RESPIRATORY_TRACT
  Filled 2017-06-29: qty 6

## 2017-06-29 NOTE — ED Triage Notes (Addendum)
Pt reports sore throat for the past 2 days that has developed into a cough and runny nose. Pt threw up 1x today after a coughing fit, but no nausea. No other n/v//d. Also reports CP only with movement and palpation

## 2017-06-29 NOTE — Discharge Instructions (Signed)
Continue to stay well-hydrated. Gargle warm salt water and spit it out. Use chloraseptic spray as needed for sore throat. Continue to alternate between Tylenol and Ibuprofen for pain or fever. Use Mucinex for cough suppression/expectoration of mucus. Use netipot and flonase to help with nasal congestion. May consider over-the-counter Benadryl or other antihistamine to decrease secretions and for help with your symptoms. Use inhaler as directed, as needed for cough/chest congestion/wheezing/shortness of breath. STOP SMOKING! Follow up with your primary care doctor in 5-7 days for recheck of ongoing symptoms. Return to emergency department for emergent changing or worsening of symptoms.  Also, your blood pressure was elevated today; make sure to always take your usual home blood pressure medications. Eat a low salt diet. Monitor your blood pressure at home and bring a log of the readings to your next doctor's visit with your primary care doctor. Follow up with them for ongoing management of this issue.

## 2017-06-29 NOTE — ED Provider Notes (Signed)
[facility] DEPT Provider Note   CSN: [id number] Arrival date & time: 06/29/17  1515     History   Chief Complaint Chief Complaint  Patient presents with  . Sore Throat  . Cough    HPI [name] [name] is a 37 y.o. adult with a PMHx of HTN, lupus, seizures, CVA, CKD, and lupus anticoagulant positive on chronic xarelto, who presents to the ED with complaints of URI symptoms x 2-3 days.  Patient reports having a cough with yellow sputum production, rhinorrhea, and sore throat.  Today while at work he had one episode of posttussive emesis which was nonbloody and nonbilious.  He also had some chest pain with coughing, but denies any currently and denies any chest pain at rest.  He has been taking NyQuil and DayQuil with some relief of symptoms, no known aggravating factors.  His roommate was sick last week with similar symptoms.  He endorses being a smoker.  Of note, he has not taken his BP meds today.  He denies fevers, chills, drooling, trismus, ear pain/drainage, hemoptysis, ongoing/current CP, SOB, LE swelling, abd pain, nausea, constipation, diarrhea, hematemesis, hematuria, dysuria, myalgias, arthralgias, numbness, tingling, focal weakness, HA, vision changes, or any other complaints at this time.    The history is provided by the patient and medical records. No language interpreter was used.  Sore Throat  Associated symptoms include chest pain (only with coughing, none currently/at rest). Pertinent negatives include no abdominal pain, no headaches and no shortness of breath.  Cough  This is a new problem. The current episode started more than 2 days ago. The problem occurs constantly. The problem has not changed since onset.The cough is productive of sputum. There has been no fever. Associated symptoms include chest pain (only with coughing, none currently/at rest), rhinorrhea and sore throat. Pertinent negatives include no chills, no ear pain, no headaches, no  myalgias, no shortness of breath and no wheezing. Treatments tried: nyquil/dayquil. The treatment provided mild relief. He is a smoker.    Past Medical History:  Diagnosis Date  . Hypertension   . Lupus   . Seizures ([place])   . Seizures ([place])   . Stroke ([place])   . Stroke [facility])    26 or 37 years of age.      Patient Active Problem List   Diagnosis Date Noted  . CKD (chronic kidney disease) stage 3, GFR 30-59 ml/min (HCC) 10/24/2015  . Seizures ([place]) 10/24/2015  . HTN (hypertension) 01/12/2015  . Chronic anticoagulation 04/24/2011  . Lupus anticoagulant positive 04/24/2011  . OBESITY, NOS 08/15/2006  . THROMBOCYTOPENIA 08/15/2006  . TOBACCO DEPENDENCE 08/15/2006  . CVA 08/15/2006    Past Surgical History:  Procedure Laterality Date  . RENAL BIOPSY      OB History    No data available       Home Medications    Prior to Admission medications   Medication Sig Start Date End Date Taking? Authorizing Provider  amLODipine (NORVASC) 5 MG tablet TAKE 1 TABLET BY MOUTH DAILY. 05/30/17   Alfonse Spruce, FNP  atorvastatin (LIPITOR) 40 MG tablet Take 1 tablet (40 mg total) by mouth daily. 12/14/16   Alfonse Spruce, FNP  LamoTRIgine (LAMICTAL XR) 300 MG TB24 Take 1 tablet (300 mg total) by mouth daily. 12/26/16   Law, Bea Graff, PA-C  nicotine polacrilex (NICORETTE) 4 MG gum Take 1 each (4 mg total) by mouth as needed for smoking cessation. 12/10/16   Alfonse Spruce, FNP  rivaroxaban (XARELTO) 20 MG TABS tablet Take 1 tablet (20 mg total) by mouth daily with supper. 12/10/16   Alfonse Spruce, FNP    Family History Family History  Problem Relation Age of Onset  . Diabetes Mother   . Diabetes Other     Social History Social History   Tobacco Use  . Smoking status: Former Research scientist (life sciences)  . Smokeless tobacco: Never Used  Substance Use Topics  . Alcohol use: Yes    Alcohol/week: 0.6 oz    Types: 1 Cans of beer per week    Comment: occassional  . Drug use: No       Allergies   Zyrtec [cetirizine]   Review of Systems Review of Systems  Constitutional: Negative for chills and fever.  HENT: Positive for rhinorrhea and sore throat. Negative for drooling, ear discharge, ear pain and trouble swallowing.   Eyes: Negative for visual disturbance.  Respiratory: Positive for cough. Negative for shortness of breath and wheezing.        No hemoptysis  Cardiovascular: Positive for chest pain (only with coughing, none currently/at rest). Negative for leg swelling.  Gastrointestinal: Positive for vomiting (1x posttussive). Negative for abdominal pain, constipation, diarrhea and nausea.  Genitourinary: Negative for dysuria and hematuria.  Musculoskeletal: Negative for arthralgias and myalgias.  Skin: Negative for color change.  Allergic/Immunologic: Negative for immunocompromised state.  Neurological: Negative for weakness, numbness and headaches.  Psychiatric/Behavioral: Negative for confusion.   All other systems reviewed and are negative for acute change except as noted in the HPI.    Physical Exam Updated Vital Signs BP (!) 185/131 (BP Location: Right Arm)   Pulse 72   Temp 97.6 F (36.4 C) (Oral)   Resp 18   SpO2 100%    Physical Exam  Constitutional: He is oriented to person, place, and time. Vital signs are normal. He appears well-developed and well-nourished.  Non-toxic appearance. No distress.  Afebrile, nontoxic, NAD, HTN noted 180s/130s  HENT:  Head: Normocephalic and atraumatic.  Nose: Mucosal edema present.  Mouth/Throat: Uvula is midline and mucous membranes are normal. No trismus in the jaw. No uvula swelling. Posterior oropharyngeal erythema present. No oropharyngeal exudate, posterior oropharyngeal edema or tonsillar abscesses. Tonsils are 0 on the right. Tonsils are 0 on the left. No tonsillar exudate.  Nose congested. Oropharynx injected, without uvular swelling or deviation, no trismus or drooling, no tonsillar swelling or  exudates. No PTA.     Eyes: Conjunctivae and EOM are normal. Right eye exhibits no discharge. Left eye exhibits no discharge.  Neck: Normal range of motion. Neck supple.  Cardiovascular: Normal rate, regular rhythm, normal heart sounds and intact distal pulses. Exam reveals no gallop and no friction rub.  No murmur heard. RRR, nl s1/s2, no m/r/g, distal pulses intact, no pedal edema   Pulmonary/Chest: Effort normal. No respiratory distress. He has decreased breath sounds in the left lower field. He has no wheezes. He has no rhonchi. He has no rales. He exhibits tenderness. He exhibits no crepitus, no deformity and no retraction.  Slightly diminished lung sounds in LLL, otherwise CTAB in all other lung fields, no w/r/r, no hypoxia or increased WOB, speaking in full sentences, SpO2 100% on RA Chest wall with mild TTP over central sternum, without crepitus, deformities, or retractions     Abdominal: Soft. Normal appearance and bowel sounds are normal. He exhibits no distension. There is no tenderness. There is no rigidity, no rebound, no guarding, no CVA tenderness, no tenderness at McBurney's  point and negative Murphy's sign.  Musculoskeletal: Normal range of motion.  MAE x4 Strength and sensation grossly intact in all extremities Distal pulses intact Gait steady No pedal edema, neg homan's bilaterally   Neurological: He is alert and oriented to person, place, and time. He has normal strength. No sensory deficit.  Skin: Skin is warm, dry and intact. No rash noted.  Psychiatric: He has a normal mood and affect.  Nursing note and vitals reviewed.    ED Treatments / Results  Labs (all labs ordered are listed, but only abnormal results are displayed) Labs Reviewed - No data to display  EKG  EKG Interpretation  Date/Time:  Saturday June 29 2017 18:57:29 EST Ventricular Rate:  67 PR Interval:    QRS Duration: 113 QT Interval:  415 QTC Calculation: 439 R Axis:   -18 Text  Interpretation:  Sinus or ectopic atrial rhythm Probable lateral infarct, age indeterminate Confirmed by Lacretia Leigh [phone]) on 06/29/2017 7:14:50 PM       Radiology Dg Chest 2 View  Result Date: 06/29/2017 CLINICAL DATA:  Cough and runny nose for 2 days. EXAM: CHEST  2 VIEW COMPARISON:  February 16, 2009 FINDINGS: The heart size and mediastinal contours are within normal limits. Both lungs are clear. The visualized skeletal structures are unremarkable. IMPRESSION: No active cardiopulmonary disease. Electronically Signed   By: Abelardo Diesel M.D.   On: 06/29/2017 19:40    Procedures Procedures (including critical care time)  Medications Ordered in ED Medications  albuterol (PROVENTIL) (2.5 MG/3ML) 0.083% nebulizer solution 5 mg (5 mg Nebulization Given 06/29/17 1857)  ipratropium (ATROVENT) nebulizer solution 0.5 mg (0.5 mg Nebulization Given 06/29/17 1857)  albuterol (PROVENTIL HFA;VENTOLIN HFA) 108 (90 Base) MCG/ACT inhaler 2 puff (2 puffs Inhalation Given 06/29/17 2116)     Initial Impression / Assessment and Plan / ED Course  I have reviewed the triage vital signs and the nursing notes.  Pertinent labs & imaging results that were available during my care of the patient were reviewed by me and considered in my medical decision making (see chart for details).     37 y.o. male with complaints of cough, rhinorrhea, sore throat x3 days, and one episode of posttussive emesis today. Also has CP if he coughs, none at rest/currently. On exam, slightly diminished lung sounds in LLL, no wheezing/rhonchi/rales, no hypoxia or tachycardia, no pedal edema, chest wall mildly TTP. Throat mildly injected but otherwise unremarkable. Nose congested. Will get CXR and EKG, but doubt need for labs. Will give duoneb then reassess shortly. Of note, BP elevated at 180s/130s but pt hasn't taken his meds today, and is overall asymptomatic, doubt need for further emergent work up of this. Will reassess shortly.    9:17 PM CXR negative. EKG without acute ischemic findings. Pt's lung sounds greatly improved after duoneb, no longer diminished; pt feeling better as well. Likely viral URI/cough. Advised OTC remedies for symptomatic relief, will send home with inhaler as well. Advised f/up with PCP in 1wk for recheck. Tobacco cessation strongly advised. Discussed DASH diet and compliance with HTN meds and f/up with PCP regarding his HTN. I explained the diagnosis and have given explicit precautions to return to the ER including for any other new or worsening symptoms. The patient understands and accepts the medical plan as it's been dictated and I have answered their questions. Discharge instructions concerning home care and prescriptions have been given. The patient is STABLE and is discharged to home in good condition.  Final Clinical Impressions(s) / ED Diagnoses   Final diagnoses:  Upper respiratory tract infection, unspecified type  Cough  Sore throat  Post-tussive emesis  Chest wall pain  Essential hypertension  Tobacco user    ED Discharge Orders        Ordered    albuterol (PROVENTIL HFA;VENTOLIN HFA) 108 (90 Base) MCG/ACT inhaler  Every 4 hours PRN     06/29/17 [address], [place], [place] 06/29/17 2117    Lacretia Leigh, MD 06/30/17 1556

## 2017-07-09 ENCOUNTER — Other Ambulatory Visit: Payer: Self-pay | Admitting: Family Medicine

## 2017-07-09 DIAGNOSIS — I10 Essential (primary) hypertension: Secondary | ICD-10-CM

## 2017-07-09 MED FILL — lamoTRIgine ER 300 MG TB24: 300 | 30 days supply | Qty: 30 | Fill #5

## 2017-07-11 ENCOUNTER — Other Ambulatory Visit: Payer: Self-pay | Admitting: Family Medicine

## 2017-07-11 DIAGNOSIS — I10 Essential (primary) hypertension: Secondary | ICD-10-CM

## 2017-07-19 ENCOUNTER — Other Ambulatory Visit: Payer: Self-pay | Admitting: Pharmacist

## 2017-07-19 MED ORDER — INFLUENZA VAC SPLIT QUAD 0.5 ML IM SUSY: 0.5000 mL | PREFILLED_SYRINGE | Freq: Once | INTRAMUSCULAR | 0 refills | Status: AC

## 2017-07-19 MED FILL — FLUARIX QUADRIVALENT 0.5 ML: 0.5 | 1 days supply | Qty: 1 | Fill #0

## 2017-08-13 MED FILL — lamoTRIgine ER 300 MG TB24: 300 | 30 days supply | Qty: 30 | Fill #6

## 2017-08-22 ENCOUNTER — Ambulatory Visit: Payer: BLUE CROSS/BLUE SHIELD | Attending: Internal Medicine | Admitting: Physician Assistant

## 2017-08-22 VITALS — BP 182/129 | HR 66 | Temp 98.6°F | Resp 16 | Ht 69.0 in | Wt 276.4 lb

## 2017-08-22 DIAGNOSIS — Z9114 Patient's other noncompliance with medication regimen: Secondary | ICD-10-CM | POA: Diagnosis not present

## 2017-08-22 DIAGNOSIS — Z91199 Patient's noncompliance with other medical treatment and regimen due to unspecified reason: Secondary | ICD-10-CM

## 2017-08-22 DIAGNOSIS — Z9119 Patient's noncompliance with other medical treatment and regimen: Secondary | ICD-10-CM

## 2017-08-22 DIAGNOSIS — Z8673 Personal history of transient ischemic attack (TIA), and cerebral infarction without residual deficits: Secondary | ICD-10-CM | POA: Diagnosis not present

## 2017-08-22 DIAGNOSIS — I160 Hypertensive urgency: Secondary | ICD-10-CM | POA: Diagnosis not present

## 2017-08-22 DIAGNOSIS — I10 Essential (primary) hypertension: Secondary | ICD-10-CM | POA: Diagnosis not present

## 2017-08-22 DIAGNOSIS — M329 Systemic lupus erythematosus, unspecified: Secondary | ICD-10-CM | POA: Diagnosis not present

## 2017-08-22 DIAGNOSIS — Z7901 Long term (current) use of anticoagulants: Secondary | ICD-10-CM | POA: Diagnosis not present

## 2017-08-22 DIAGNOSIS — Z79899 Other long term (current) drug therapy: Secondary | ICD-10-CM | POA: Insufficient documentation

## 2017-08-22 DIAGNOSIS — N289 Disorder of kidney and ureter, unspecified: Secondary | ICD-10-CM | POA: Diagnosis not present

## 2017-08-22 DIAGNOSIS — Z888 Allergy status to other drugs, medicaments and biological substances status: Secondary | ICD-10-CM | POA: Insufficient documentation

## 2017-08-22 DIAGNOSIS — Z760 Encounter for issue of repeat prescription: Secondary | ICD-10-CM | POA: Diagnosis present

## 2017-08-22 MED ORDER — AMLODIPINE BESYLATE 10 MG PO TABS: 10.0000 mg | ORAL_TABLET | Freq: Every day | ORAL | 3 refills | Status: DC

## 2017-08-22 MED ORDER — CLONIDINE HCL 0.1 MG PO TABS
0.1000 mg | ORAL_TABLET | Freq: Once | ORAL | Status: AC
  Administered 2017-08-22: 0.1 mg via ORAL

## 2017-08-22 MED ORDER — RIVAROXABAN 20 MG PO TABS: 20.0000 mg | ORAL_TABLET | Freq: Every day | ORAL | 2 refills | Status: DC

## 2017-08-22 MED FILL — AMLODIPINE BESYLATE 10 MG T: 10 | 30 days supply | Qty: 30 | Fill #0

## 2017-08-22 MED FILL — XARELTO 20 MG TABLET: 20 | 30 days supply | Qty: 30 | Fill #0

## 2017-08-22 NOTE — Progress Notes (Signed)
Patient ID: [name] [name], male   DOB: 11-20-1980, 37 y.o.   MRN: [id number]   [name] [name], is a 37 y.o. male  YQM:[id number]  BMW:[id number]  DOB - 12-Dec-1980  Subjective:  Chief Complaint and HPI: [name] [name] is a 37 y.o. male here today for medication RF.  Out of meds X 2 months.  Denies CP/dizziness/ HA/SOB.  Not taking any meds except lamictal.    ROS:   Constitutional:  No f/c, No night sweats, No unexplained weight loss. EENT:  No vision changes, No blurry vision, No hearing changes. No mouth, throat, or ear problems.  Respiratory: No cough, No SOB Cardiac: No CP, no palpitations GI:  No abd pain, No N/V/D. GU: No Urinary s/sx Musculoskeletal: No joint pain Neuro: No headache, no dizziness, no motor weakness.  Skin: No rash Endocrine:  No polydipsia. No polyuria.  Psych: Denies SI/HI  No problems updated.  ALLERGIES: Allergies  Allergen Reactions  . Zyrtec [Cetirizine]     Facial swelling    PAST MEDICAL HISTORY: Past Medical History:  Diagnosis Date  . Hypertension   . Lupus   . Seizures ([place])   . Seizures ([place])   . Stroke ([place])   . Stroke [facility])    63 or 37 years of age.      MEDICATIONS AT HOME: Prior to Admission medications   Medication Sig Start Date End Date Taking? Authorizing Provider  albuterol (PROVENTIL HFA;VENTOLIN HFA) 108 (90 Base) MCG/ACT inhaler Inhale 1-2 puffs into the lungs every 4 (four) hours as needed for wheezing or shortness of breath (or cough). 06/29/17  Yes Street, Sandoval, PA-C  LamoTRIgine (LAMICTAL XR) 300 MG TB24 Take 1 tablet (300 mg total) by mouth daily. 12/26/16  Yes Law, Alexandra M, PA-C  amLODipine (NORVASC) 10 MG tablet Take 1 tablet (10 mg total) by mouth daily. 08/22/17   Argentina Donovan, PA-C  atorvastatin (LIPITOR) 40 MG tablet Take 1 tablet (40 mg total) by mouth daily. Patient not taking: Reported on 06/29/2017 12/14/16   Alfonse Spruce, FNP  nicotine polacrilex (NICORETTE) 4 MG gum Take 1 each (4 mg total)  by mouth as needed for smoking cessation. Patient not taking: Reported on 06/29/2017 12/10/16   Alfonse Spruce, FNP  rivaroxaban (XARELTO) 20 MG TABS tablet Take 1 tablet (20 mg total) by mouth daily with supper. 08/22/17   Argentina Donovan, PA-C     Objective:  EXAM:   Vitals:   08/22/17 1546  BP: (!) 185/136  Pulse: 68  Resp: 16  Temp: 98.6 F (37 C)  TempSrc: Oral  SpO2: 100%  Weight: 276 lb 6.4 oz (125.4 kg)  Height: 5\' 9"  (1.753 m)    General appearance : A&OX3. NAD. Non-toxic-appearing HEENT: Atraumatic and Normocephalic.  PERRLA. EOM intact.   Neck: supple, no JVD. No cervical lymphadenopathy. No thyromegaly Chest/Lungs:  Breathing-non-labored, Good air entry bilaterally, breath sounds normal without rales, rhonchi, or wheezing  CVS: S1 S2 regular, no murmurs, gallops, rubs  Extremities: Bilateral Lower Ext shows no edema, both legs are warm to touch with = pulse throughout Neurology:  CN II-XII grossly intact, Non focal.   Psych:  TP linear. J/I WNL. Normal speech. Appropriate eye contact and affect.  Skin:  No Rash  Data Review Lab Results  Component Value Date   HGBA1C 5.8 (H) 01/18/2014     Assessment & Plan   1. Hypertensive urgency due to non-compliance with medication regimen - cloNIDine (CATAPRES) tablet 0.1 mg now.  BP  20 mins after administration  2. Abnormal kidney function Patient did not go to appt - Ambulatory referral to Nephrology  3. Essential hypertension Uncontrolled.  compromised kidney function-will not restart lisinopril. start- amLODipine (NORVASC) 10 MG tablet; Take 1 tablet (10 mg total) by mouth daily.  Dispense: 90 tablet; Refill: 3 - Comprehensive metabolic panel - CBC with Differential/Platelet  4. Anticoagulated resume - rivaroxaban (XARELTO) 20 MG TABS tablet; Take 1 tablet (20 mg total) by mouth daily with supper.  Dispense: 30 tablet; Refill: 2  5. Non-compliance Counseled patient more than 72mins face to face on  dangers of non-compliance with medication regimen.  Dangers of morbidity and mortality reviewed thoroughly.    6. Chronic anticoagulation See #4  Patient have been counseled extensively about nutrition and exercise  Return for BP check with Theda Sers in 2 weeks; assign new PCP 1 month.  The patient was given clear instructions to go to ER or return to medical center if symptoms don't improve, worsen or new problems develop. The patient verbalized understanding. The patient was told to call to get lab results if they haven't heard anything in the next week.   Freeman Caldron, PA-C [facility] and [place], [place]   08/22/2017, 4:06 PM

## 2017-08-22 NOTE — Progress Notes (Signed)
Pt's is here for high blood pressure. Need medication refills, ran out for 2 months. Pt's stated he wants to be back on coumadin.

## 2017-08-23 LAB — CBC WITH DIFFERENTIAL/PLATELET
BASOS ABS: 0 10*3/uL (ref 0.0–0.2)
Basos: 0 %
EOS (ABSOLUTE): 0.1 10*3/uL (ref 0.0–0.4)
Eos: 2 %
Hematocrit: 38.6 % (ref 37.5–51.0)
Hemoglobin: 13 g/dL (ref 13.0–17.7)
Immature Grans (Abs): 0 10*3/uL (ref 0.0–0.1)
Immature Granulocytes: 0 %
Lymphocytes Absolute: 1.6 10*3/uL (ref 0.7–3.1)
Lymphs: 24 %
MCH: 29.6 pg (ref 26.6–33.0)
MCHC: 33.7 g/dL (ref 31.5–35.7)
MCV: 88 fL (ref 79–97)
MONOS ABS: 0.5 10*3/uL (ref 0.1–0.9)
Monocytes: 8 %
NEUTROS ABS: 4.2 10*3/uL (ref 1.4–7.0)
NEUTROS PCT: 66 %
PLATELETS: 117 10*3/uL — AB (ref 150–379)
RBC: 4.39 x10E6/uL (ref 4.14–5.80)
RDW: 13.4 % (ref 12.3–15.4)
WBC: 6.5 10*3/uL (ref 3.4–10.8)

## 2017-08-23 LAB — COMPREHENSIVE METABOLIC PANEL
A/G RATIO: 1.2 (ref 1.2–2.2)
ALT: 12 IU/L (ref 0–44)
AST: 16 IU/L (ref 0–40)
Albumin: 3.5 g/dL (ref 3.5–5.5)
Alkaline Phosphatase: 104 IU/L (ref 39–117)
BUN/Creatinine Ratio: 6 — ABNORMAL LOW (ref 9–20)
BUN: 30 mg/dL — ABNORMAL HIGH (ref 6–20)
CHLORIDE: 105 mmol/L (ref 96–106)
CO2: 25 mmol/L (ref 20–29)
Calcium: 8.7 mg/dL (ref 8.7–10.2)
Creatinine, Ser: 5.11 mg/dL — ABNORMAL HIGH (ref 0.76–1.27)
GFR calc Af Amer: 15 mL/min/{1.73_m2} — ABNORMAL LOW (ref >59)
GFR calc non Af Amer: 13 mL/min/{1.73_m2} — ABNORMAL LOW (ref >59)
GLOBULIN, TOTAL: 2.9 g/dL (ref 1.5–4.5)
Glucose: 86 mg/dL (ref 65–99)
POTASSIUM: 4.7 mmol/L (ref 3.5–5.2)
SODIUM: 140 mmol/L (ref 134–144)
Total Protein: 6.4 g/dL (ref 6.0–8.5)

## 2017-09-05 ENCOUNTER — Encounter: Payer: BLUE CROSS/BLUE SHIELD | Admitting: Pharmacist

## 2017-09-05 ENCOUNTER — Telehealth: Payer: Self-pay | Admitting: Pharmacist

## 2017-09-05 NOTE — Telephone Encounter (Signed)
noted 

## 2017-09-05 NOTE — Telephone Encounter (Signed)
Patient was supposed to see me for blood pressure visit today but did not show up. When reviewing his chart, I noticed that his SCr is elevated at 5.11. He likely needs to be transitioned off of Xarelto due to renal function. However, he has been very noncompliant on warfarin in the past and has a 45% no show rate. Will forward to provider who he is establishing care with next week for review prior to her visit with him. He was previously stable on 10 mg daily warfarin (when he took it) but I would give him a limited supply to ensure he follows up if the decision is made to switch him to warfarin.   BMET    Component Value Date/Time   NA 140 08/22/2017 1642   K 4.7 08/22/2017 1642   CL 105 08/22/2017 1642   CO2 25 08/22/2017 1642   GLUCOSE 86 08/22/2017 1642   GLUCOSE 125 (H) 12/26/2016 1435   BUN 30 (H) 08/22/2017 1642   CREATININE 5.11 (H) 08/22/2017 1642   CREATININE 2.06 (H) 10/24/2015 1603   CALCIUM 8.7 08/22/2017 1642   GFRNONAA 13 (L) 08/22/2017 1642   GFRNONAA 40 (L) 10/24/2015 1603   GFRAA 15 (L) 08/22/2017 1642   GFRAA 47 (L) 10/24/2015 1603

## 2017-09-13 ENCOUNTER — Ambulatory Visit: Payer: BLUE CROSS/BLUE SHIELD | Admitting: Nurse Practitioner

## 2017-09-25 MED FILL — XARELTO 20 MG TABLET: 20 | 30 days supply | Qty: 30 | Fill #1

## 2017-09-25 MED FILL — AMLODIPINE BESYLATE 10 MG T: 10 | 30 days supply | Qty: 30 | Fill #1

## 2017-09-25 MED FILL — lamoTRIgine ER 300 MG TB24: 300 | 30 days supply | Qty: 30 | Fill #7

## 2017-10-22 ENCOUNTER — Encounter (HOSPITAL_COMMUNITY): Payer: Self-pay | Admitting: Emergency Medicine

## 2017-10-22 ENCOUNTER — Emergency Department (HOSPITAL_COMMUNITY)
Admission: EM | Admit: 2017-10-22 | Discharge: 2017-10-22 | Disposition: A | Discharge status: Home / Self Care | Payer: BLUE CROSS/BLUE SHIELD | Attending: Emergency Medicine | Admitting: Emergency Medicine

## 2017-10-22 DIAGNOSIS — F17210 Nicotine dependence, cigarettes, uncomplicated: Secondary | ICD-10-CM | POA: Insufficient documentation

## 2017-10-22 DIAGNOSIS — Z7901 Long term (current) use of anticoagulants: Secondary | ICD-10-CM | POA: Insufficient documentation

## 2017-10-22 DIAGNOSIS — Z8673 Personal history of transient ischemic attack (TIA), and cerebral infarction without residual deficits: Secondary | ICD-10-CM | POA: Insufficient documentation

## 2017-10-22 DIAGNOSIS — N183 Chronic kidney disease, stage 3 (moderate): Secondary | ICD-10-CM | POA: Diagnosis not present

## 2017-10-22 DIAGNOSIS — I129 Hypertensive chronic kidney disease with stage 1 through stage 4 chronic kidney disease, or unspecified chronic kidney disease: Secondary | ICD-10-CM | POA: Insufficient documentation

## 2017-10-22 DIAGNOSIS — B353 Tinea pedis: Secondary | ICD-10-CM | POA: Diagnosis not present

## 2017-10-22 DIAGNOSIS — Z79899 Other long term (current) drug therapy: Secondary | ICD-10-CM | POA: Diagnosis not present

## 2017-10-22 DIAGNOSIS — R21 Rash and other nonspecific skin eruption: Secondary | ICD-10-CM | POA: Diagnosis present

## 2017-10-22 MED ORDER — CLOTRIMAZOLE 1 % EX CREA: TOPICAL_CREAM | CUTANEOUS | 0 refills | Status: DC

## 2017-10-22 MED ORDER — CIPROFLOXACIN HCL 500 MG PO TABS: 500.0000 mg | ORAL_TABLET | Freq: Two times a day (BID) | ORAL | 0 refills | Status: AC

## 2017-10-22 MED FILL — CIPROFLOXACIN HCL 500 MG TA: 500 | 7 days supply | Qty: 14 | Fill #0

## 2017-10-22 NOTE — ED Triage Notes (Signed)
Pt c/o skin irritation between left 4th and 5th toes for about week. Reports been putting alcohol and peroxide on it. Denies itching or burning

## 2017-10-22 NOTE — ED Provider Notes (Signed)
[facility] DEPT Provider Note   CSN: [id number] Arrival date & time: 10/22/17  1352     History   Chief Complaint Chief Complaint  Patient presents with  . skin irritation    HPI [name] [name] is a 37 y.o. male presenting for evaluation of L pinky toe swelling.   Patient states the past 5 days, he has noticed a whiteness to the skin between his pinky and fourth toe of his left foot.  He has been trying to dry his foot out using alcohol and peroxide.  He denies pain or itching.  He denies symptoms elsewhere.  Today, his pinky toe became more red and swollen.  He denies pain.  He denies streaking.  No drainage from the foot.  He denies history of similar.  No fall, trauma, or injury.  He denies symptoms on his right foot.  He has a history of high blood pressure, does not take medication.  Does not want evaluation for his blood pressure today.  HPI  Past Medical History:  Diagnosis Date  . Hypertension   . Lupus ([place])   . Seizures ([place])   . Seizures ([place])   . Stroke ([place])   . Stroke [facility])    66 or 37 years of age.      Patient Active Problem List   Diagnosis Date Noted  . CKD (chronic kidney disease) stage 3, GFR 30-59 ml/min (HCC) 10/24/2015  . Seizures ([place]) 10/24/2015  . HTN (hypertension) 01/12/2015  . Chronic anticoagulation 04/24/2011  . Lupus anticoagulant positive 04/24/2011  . OBESITY, NOS 08/15/2006  . THROMBOCYTOPENIA 08/15/2006  . TOBACCO DEPENDENCE 08/15/2006  . CVA 08/15/2006    Past Surgical History:  Procedure Laterality Date  . RENAL BIOPSY          Home Medications    Prior to Admission medications   Medication Sig Start Date End Date Taking? Authorizing Provider  albuterol (PROVENTIL HFA;VENTOLIN HFA) 108 (90 Base) MCG/ACT inhaler Inhale 1-2 puffs into the lungs every 4 (four) hours as needed for wheezing or shortness of breath (or cough). 06/29/17   Street, Mercedes, PA-C  amLODipine (NORVASC) 10 MG tablet Take  1 tablet (10 mg total) by mouth daily. 08/22/17   Argentina Donovan, PA-C  atorvastatin (LIPITOR) 40 MG tablet Take 1 tablet (40 mg total) by mouth daily. Patient not taking: Reported on 06/29/2017 12/14/16   Alfonse Spruce, FNP  ciprofloxacin (CIPRO) 500 MG tablet Take 1 tablet (500 mg total) by mouth every 12 (twelve) hours for 7 days. 10/22/17 10/29/17  Alexcis Bicking, PA-C  clotrimazole (LOTRIMIN) 1 % cream Apply to affected area 2 times daily 10/22/17   Renji Berwick, PA-C  LamoTRIgine (LAMICTAL XR) 300 MG TB24 Take 1 tablet (300 mg total) by mouth daily. 12/26/16   Law, Bea Graff, PA-C  nicotine polacrilex (NICORETTE) 4 MG gum Take 1 each (4 mg total) by mouth as needed for smoking cessation. Patient not taking: Reported on 06/29/2017 12/10/16   Alfonse Spruce, FNP  rivaroxaban (XARELTO) 20 MG TABS tablet Take 1 tablet (20 mg total) by mouth daily with supper. 08/22/17   Argentina Donovan, PA-C    Family History Family History  Problem Relation Age of Onset  . Diabetes Mother   . Diabetes Other     Social History Social History   Tobacco Use  . Smoking status: Current Every Day Smoker  . Smokeless tobacco: Never Used  Substance Use Topics  . Alcohol use: Yes  .  Drug use: No     Allergies   Zyrtec [cetirizine]   Review of Systems Review of Systems  Constitutional: Negative for fever.  Skin: Positive for color change. Negative for wound.       White skin change between 4th and 5th toes of L foot. L pinky toe erythema and swelling     Physical Exam Updated Vital Signs BP (!) 188/123 (BP Location: Left Arm)   Pulse 98   Temp 98.2 F (36.8 C) (Oral)   Resp 20   Ht 6\' 1"  (1.854 m)   Wt 124.7 kg (275 lb)   SpO2 100%   BMI 36.28 kg/m   Physical Exam  Constitutional: He is oriented to person, place, and time. He appears well-developed and well-nourished. No distress.  HENT:  Head: Normocephalic and atraumatic.  Eyes: EOM are normal.  Neck: Normal range  of motion.  Pulmonary/Chest: Effort normal.  Abdominal: He exhibits no distension.  Musculoskeletal: Normal range of motion.  Neurological: He is alert and oriented to person, place, and time.  Skin: Skin is warm. Capillary refill takes less than 2 seconds.  White skin changes between the fourth and fifth toes consistent with tinea pedis.  Skin cracked over the fifth toe.  Mild erythema and swelling of the pinky toe without pain.  No streaking redness.  No drainage.  No swelling of the fourth toes.  Pedal pulses intact bilaterally.  Sensation intact bilaterally.  Good cap refill.  Psychiatric: He has a normal mood and affect.  Nursing note and vitals reviewed.    ED Treatments / Results  Labs (all labs ordered are listed, but only abnormal results are displayed) Labs Reviewed - No data to display  EKG None  Radiology No results found.  Procedures Procedures (including critical care time)  Medications Ordered in ED Medications - No data to display   Initial Impression / Assessment and Plan / ED Course  I have reviewed the triage vital signs and the nursing notes.  Pertinent labs & imaging results that were available during my care of the patient were reviewed by me and considered in my medical decision making (see chart for details).     Patient presenting for evaluation of skin changes of the toe.  Physical exam consistent with tinea pedis.  Skin cracked, and new erythema and swelling of the pinky toe, consider potential secondary infection.  Will treat with Lotrimin and ciprofloxacin (to cover for pseudomonas).  At this time, doubt osteo, systemic infection, or need for emergent surgery.  Follow-up with podiatry as needed. Pt's BP is elevated. Per chart review, h/o similar. He is not taking medications. Discussed importance of BP control with pt. at this time, patient appears safe for discharge.  Return precautions given.  Patient states he understands and agrees to plan.    Final Clinical Impressions(s) / ED Diagnoses   Final diagnoses:  Tinea pedis of left foot    ED Discharge Orders        Ordered    clotrimazole (LOTRIMIN) 1 % cream     10/22/17 1445    ciprofloxacin (CIPRO) 500 MG tablet  Every 12 hours     10/22/17 1445       Danylah Holden, PA-C 10/22/17 1454    Davonna Belling, MD 10/22/17 1617

## 2017-10-22 NOTE — Discharge Instructions (Signed)
Use Lotrimin cream twice a day until symptoms have resolved. Take antibiotics as prescribed.  Take the entire course, even if your symptoms improve. Try to keep your feet dry. Follow-up with Dr. Amalia Hailey from podiatry if your symptoms are not improving. Return to the emergency room if you develop pain, fevers, red streaking up your foot, or any new or concerning symptoms.

## 2017-11-01 MED FILL — AMLODIPINE BESYLATE 10 MG T: 10 | 30 days supply | Qty: 30 | Fill #2

## 2017-11-01 MED FILL — XARELTO 20 MG TABLET: 20 | 30 days supply | Qty: 30 | Fill #2

## 2017-12-09 ENCOUNTER — Other Ambulatory Visit: Payer: Self-pay | Admitting: Physician Assistant

## 2017-12-09 DIAGNOSIS — Z7901 Long term (current) use of anticoagulants: Secondary | ICD-10-CM

## 2017-12-09 MED FILL — XARELTO 20 MG TABLET: 20 | 30 days supply | Qty: 30 | Fill #1

## 2017-12-09 MED FILL — AMLODIPINE BESYLATE 10 MG T: 10 | 30 days supply | Qty: 30 | Fill #3

## 2017-12-10 NOTE — Telephone Encounter (Signed)
Per chart, patient is non-compliant with medications/keeping appointments. Need updated labs as BMP taken in 3/19 reveals impaired renal function w/ creatinine of 5.11. Must be seen by provider. Agree with Stracey's note from 09/05/17. May consider warfarin in this patient with limited supply to ensure that he follows up with Coumadin clinic.   BMET    Component Value Date/Time   NA 140 08/22/2017 1642   K 4.7 08/22/2017 1642   CL 105 08/22/2017 1642   CO2 25 08/22/2017 1642   GLUCOSE 86 08/22/2017 1642   GLUCOSE 125 (H) 12/26/2016 1435   BUN 30 (H) 08/22/2017 1642   CREATININE 5.11 (H) 08/22/2017 1642   CREATININE 2.06 (H) 10/24/2015 1603   CALCIUM 8.7 08/22/2017 1642   GFRNONAA 13 (L) 08/22/2017 1642   GFRNONAA 40 (L) 10/24/2015 1603   GFRAA 15 (L) 08/22/2017 1642   GFRAA 47 (L) 10/24/2015 [place], PharmD, [place] [phone]

## 2017-12-12 ENCOUNTER — Ambulatory Visit: Payer: BLUE CROSS/BLUE SHIELD | Attending: Family Medicine | Admitting: Physician Assistant

## 2017-12-12 VITALS — BP 155/104 | HR 89 | Temp 98.0°F | Resp 18 | Ht 73.0 in | Wt 276.0 lb

## 2017-12-12 DIAGNOSIS — R569 Unspecified convulsions: Secondary | ICD-10-CM

## 2017-12-12 DIAGNOSIS — I10 Essential (primary) hypertension: Secondary | ICD-10-CM

## 2017-12-12 DIAGNOSIS — Z8673 Personal history of transient ischemic attack (TIA), and cerebral infarction without residual deficits: Secondary | ICD-10-CM | POA: Diagnosis not present

## 2017-12-12 DIAGNOSIS — Z7902 Long term (current) use of antithrombotics/antiplatelets: Secondary | ICD-10-CM | POA: Insufficient documentation

## 2017-12-12 DIAGNOSIS — B079 Viral wart, unspecified: Secondary | ICD-10-CM | POA: Diagnosis not present

## 2017-12-12 DIAGNOSIS — Z79899 Other long term (current) drug therapy: Secondary | ICD-10-CM | POA: Insufficient documentation

## 2017-12-12 DIAGNOSIS — M329 Systemic lupus erythematosus, unspecified: Secondary | ICD-10-CM | POA: Diagnosis not present

## 2017-12-12 MED ORDER — HYDROCHLOROTHIAZIDE 25 MG PO TABS: 25.0000 mg | ORAL_TABLET | Freq: Every day | ORAL | 3 refills | Status: DC

## 2017-12-12 MED ORDER — LAMOTRIGINE ER 300 MG PO TB24: 300.0000 mg | ORAL_TABLET | Freq: Every day | ORAL | 5 refills | Status: DC

## 2017-12-12 MED ORDER — AMLODIPINE BESYLATE 10 MG PO TABS: 10.0000 mg | ORAL_TABLET | Freq: Every day | ORAL | 3 refills | Status: DC

## 2017-12-12 MED FILL — lamoTRIgine ER 300 MG TB24: 300 | 30 days supply | Qty: 30 | Fill #0

## 2017-12-12 MED FILL — HYDROCHLOROTHIAZIDE 25 MG T: 25 | 90 days supply | Qty: 90 | Fill #0

## 2017-12-12 NOTE — Progress Notes (Signed)
Patient ID: [name] [name], male   DOB: Jun 30, 1980, 37 y.o.   MRN: [id number]   [name] [name], is a 37 y.o. male  TIW:[id number]  SNK:[id number]  DOB - 1981-01-15  Subjective:  Chief Complaint and HPI: [name] [name] is a 37 y.o. male here today for lamictal RF, htn check up, and warts on hands.  Compliant with amlodipine and lamictal.  No blood work in a hile.  No seizures in a long time.  Denies CP/SOB/HA.    Warts on B hands for months and getting bigger and spreading.    ROS:   Constitutional:  No f/c, No night sweats, No unexplained weight loss. EENT:  No vision changes, No blurry vision, No hearing changes. No mouth, throat, or ear problems.  Respiratory: No cough, No SOB Cardiac: No CP, no palpitations GI:  No abd pain, No N/V/D. GU: No Urinary s/sx Musculoskeletal: No joint pain Neuro: No headache, no dizziness, no motor weakness.  Skin: No rash; + warts Endocrine:  No polydipsia. No polyuria.  Psych: Denies SI/HI  No problems updated.  ALLERGIES: Allergies  Allergen Reactions  . Zyrtec [Cetirizine]     Facial swelling    PAST MEDICAL HISTORY: Past Medical History:  Diagnosis Date  . Hypertension   . Lupus ([place])   . Seizures ([place])   . Seizures ([place])   . Stroke ([place])   . Stroke [facility])    42 or 37 years of age.      MEDICATIONS AT HOME: Prior to Admission medications   Medication Sig Start Date End Date Taking? Authorizing Provider  albuterol (PROVENTIL HFA;VENTOLIN HFA) 108 (90 Base) MCG/ACT inhaler Inhale 1-2 puffs into the lungs every 4 (four) hours as needed for wheezing or shortness of breath (or cough). 06/29/17  Yes Street, Mercedes, PA-C  amLODipine (NORVASC) 10 MG tablet Take 1 tablet (10 mg total) by mouth daily. 12/12/17  Yes Freeman Caldron M, PA-C  clotrimazole (LOTRIMIN) 1 % cream Apply to affected area 2 times daily 10/22/17  Yes Caccavale, Sophia, PA-C  LamoTRIgine (LAMICTAL XR) 300 MG TB24 24 hour tablet Take 1 tablet (300 mg total) by mouth daily.  12/12/17  Yes Argentina Donovan, PA-C  rivaroxaban (XARELTO) 20 MG TABS tablet Take 1 tablet (20 mg total) by mouth daily with supper. 08/22/17  Yes Freeman Caldron M, PA-C  atorvastatin (LIPITOR) 40 MG tablet Take 1 tablet (40 mg total) by mouth daily. Patient not taking: Reported on 06/29/2017 12/14/16   Alfonse Spruce, FNP  hydrochlorothiazide (HYDRODIURIL) 25 MG tablet Take 1 tablet (25 mg total) by mouth daily. 12/12/17   Argentina Donovan, PA-C  nicotine polacrilex (NICORETTE) 4 MG gum Take 1 each (4 mg total) by mouth as needed for smoking cessation. Patient not taking: Reported on 06/29/2017 12/10/16   Alfonse Spruce, FNP     Objective:  EXAM:   Vitals:   12/12/17 1552  BP: (!) 155/104  Pulse: 89  Resp: 18  Temp: 98 F (36.7 C)  TempSrc: Oral  SpO2: 99%  Weight: 276 lb (125.2 kg)  Height: 6\' 1"  (1.854 m)    General appearance : A&OX3. NAD. Non-toxic-appearing HEENT: Atraumatic and Normocephalic.  PERRLA. EOM intact.   Neck: supple, no JVD. No cervical lymphadenopathy. No thyromegaly Chest/Lungs:  Breathing-non-labored, Good air entry bilaterally, breath sounds normal without rales, rhonchi, or wheezing  CVS: S1 S2 regular, no murmurs, gallops, rubs  Extremities: Bilateral Lower Ext shows no edema, both legs are warm to touch with = pulse  throughout Neurology:  CN II-XII grossly intact, Non focal.   Psych:  TP linear. J/I WNL. Normal speech. Appropriate eye contact and affect.  Skin:  No Rash;  Large common warts on L volar wrist and R dorsum of hand  Data Review Lab Results  Component Value Date   HGBA1C 5.8 (H) 01/18/2014     Assessment & Plan   1. Essential hypertension Uncontrolled.  Add HCTZ to amlodipine.  Check BP 3-4 times/week and record and bring to next visit - Comprehensive metabolic panel - CBC with Differential/Platelet - hydrochlorothiazide (HYDRODIURIL) 25 MG tablet; Take 1 tablet (25 mg total) by mouth daily.  Dispense: 90 tablet; Refill:  3 - amLODipine (NORVASC) 10 MG tablet; Take 1 tablet (10 mg total) by mouth daily.  Dispense: 90 tablet; Refill: 3  2. Seizures (HCC) stable - Lamotrigine level - LamoTRIgine (LAMICTAL XR) 300 MG TB24 24 hour tablet; Take 1 tablet (300 mg total) by mouth daily.  Dispense: 30 tablet; Refill: 5  3. Viral warts, unspecified type - Ambulatory referral to Dermatology     Patient have been counseled extensively about nutrition and exercise  Return in about 1 month (around 01/11/2018) for assign new PCP; f/up htn.  The patient was given clear instructions to go to ER or return to medical center if symptoms don't improve, worsen or new problems develop. The patient verbalized understanding. The patient was told to call to get lab results if they haven't heard anything in the next week.     Freeman Caldron, PA-C [facility] and [place], [place]   12/12/2017, 4:04 PM

## 2017-12-12 NOTE — Patient Instructions (Signed)
Check blood pressures 3-4 times/week and record.  Bring to next visit

## 2017-12-13 LAB — CBC WITH DIFFERENTIAL/PLATELET
Basophils Absolute: 0 10*3/uL (ref 0.0–0.2)
Basos: 1 %
EOS (ABSOLUTE): 0.2 10*3/uL (ref 0.0–0.4)
Eos: 3 %
HEMATOCRIT: 34.7 % — AB (ref 37.5–51.0)
Hemoglobin: 11.6 g/dL — ABNORMAL LOW (ref 13.0–17.7)
IMMATURE GRANS (ABS): 0 10*3/uL (ref 0.0–0.1)
IMMATURE GRANULOCYTES: 0 %
Lymphocytes Absolute: 1.4 10*3/uL (ref 0.7–3.1)
Lymphs: 19 %
MCH: 29.4 pg (ref 26.6–33.0)
MCHC: 33.4 g/dL (ref 31.5–35.7)
MCV: 88 fL (ref 79–97)
Monocytes Absolute: 0.6 10*3/uL (ref 0.1–0.9)
Monocytes: 8 %
NEUTROS ABS: 5 10*3/uL (ref 1.4–7.0)
Neutrophils: 69 %
PLATELETS: 134 10*3/uL — AB (ref 150–450)
RBC: 3.94 x10E6/uL — ABNORMAL LOW (ref 4.14–5.80)
RDW: 13.2 % (ref 12.3–15.4)
WBC: 7.3 10*3/uL (ref 3.4–10.8)

## 2017-12-13 LAB — COMPREHENSIVE METABOLIC PANEL
ALBUMIN: 3.7 g/dL (ref 3.5–5.5)
ALT: 18 IU/L (ref 0–44)
AST: 17 IU/L (ref 0–40)
Albumin/Globulin Ratio: 1.2 (ref 1.2–2.2)
Alkaline Phosphatase: 100 IU/L (ref 39–117)
BUN/Creatinine Ratio: 5 — ABNORMAL LOW (ref 9–20)
BUN: 36 mg/dL — ABNORMAL HIGH (ref 6–20)
Bilirubin Total: <0.2 mg/dL (ref 0.0–1.2)
CO2: 23 mmol/L (ref 20–29)
Calcium: 8.7 mg/dL (ref 8.7–10.2)
Chloride: 108 mmol/L — ABNORMAL HIGH (ref 96–106)
Creatinine, Ser: 7.17 mg/dL — ABNORMAL HIGH (ref 0.76–1.27)
GFR, EST AFRICAN AMERICAN: 10 mL/min/{1.73_m2} — AB (ref >59)
GFR, EST NON AFRICAN AMERICAN: 9 mL/min/{1.73_m2} — AB (ref >59)
GLOBULIN, TOTAL: 3 g/dL (ref 1.5–4.5)
Glucose: 92 mg/dL (ref 65–99)
POTASSIUM: 4.4 mmol/L (ref 3.5–5.2)
SODIUM: 143 mmol/L (ref 134–144)
TOTAL PROTEIN: 6.7 g/dL (ref 6.0–8.5)

## 2017-12-13 LAB — LAMOTRIGINE LEVEL: Lamotrigine Lvl: 5.2 ug/mL (ref 2.0–20.0)

## 2017-12-16 ENCOUNTER — Telehealth (INDEPENDENT_AMBULATORY_CARE_PROVIDER_SITE_OTHER): Payer: Self-pay | Admitting: *Deleted

## 2017-12-16 ENCOUNTER — Other Ambulatory Visit: Payer: Self-pay | Admitting: Physician Assistant

## 2017-12-16 DIAGNOSIS — N189 Chronic kidney disease, unspecified: Secondary | ICD-10-CM

## 2017-12-16 MED ORDER — FUROSEMIDE 20 MG PO TABS: 20.0000 mg | ORAL_TABLET | Freq: Every day | ORAL | 3 refills | Status: DC

## 2017-12-16 NOTE — Telephone Encounter (Signed)
-----   Message from Argentina Donovan, [place] sent at 12/16/2017 11:37 AM EDT ----- Please call patient.  His kidney function has worsened.  This is likely due to uncontrolled high blood pressure.  He should stop the HCTZ we just started and take Lasix instead.   He should continue amlodipine.  I am referring him to a kidney specialist.  Follow-up as planned.  Thanks, angela mcclung, PA-C

## 2017-12-16 NOTE — Telephone Encounter (Signed)
Medical Assistant left message on patient's home and cell voicemail. Voicemail states to give a call back to Kimoni Pagliarulo with [facility] at [phone].  

## 2017-12-21 ENCOUNTER — Inpatient Hospital Stay (HOSPITAL_COMMUNITY): Payer: BLUE CROSS/BLUE SHIELD

## 2017-12-21 ENCOUNTER — Inpatient Hospital Stay (HOSPITAL_COMMUNITY)
Admission: EM | Admit: 2017-12-21 | Discharge: 2017-12-26 | DRG: 683 | Disposition: A | Discharge status: Home / Self Care | Payer: BLUE CROSS/BLUE SHIELD | Attending: Family Medicine | Admitting: Family Medicine

## 2017-12-21 ENCOUNTER — Other Ambulatory Visit: Payer: Self-pay

## 2017-12-21 ENCOUNTER — Encounter (HOSPITAL_COMMUNITY): Payer: Self-pay | Admitting: Emergency Medicine

## 2017-12-21 ENCOUNTER — Emergency Department (HOSPITAL_COMMUNITY): Payer: BLUE CROSS/BLUE SHIELD

## 2017-12-21 DIAGNOSIS — R072 Precordial pain: Secondary | ICD-10-CM | POA: Diagnosis not present

## 2017-12-21 DIAGNOSIS — D6862 Lupus anticoagulant syndrome: Secondary | ICD-10-CM | POA: Diagnosis present

## 2017-12-21 DIAGNOSIS — E669 Obesity, unspecified: Secondary | ICD-10-CM | POA: Diagnosis present

## 2017-12-21 DIAGNOSIS — G40909 Epilepsy, unspecified, not intractable, without status epilepticus: Secondary | ICD-10-CM | POA: Diagnosis not present

## 2017-12-21 DIAGNOSIS — R079 Chest pain, unspecified: Secondary | ICD-10-CM | POA: Insufficient documentation

## 2017-12-21 DIAGNOSIS — D509 Iron deficiency anemia, unspecified: Secondary | ICD-10-CM | POA: Diagnosis not present

## 2017-12-21 DIAGNOSIS — I10 Essential (primary) hypertension: Secondary | ICD-10-CM | POA: Diagnosis not present

## 2017-12-21 DIAGNOSIS — R569 Unspecified convulsions: Secondary | ICD-10-CM | POA: Diagnosis not present

## 2017-12-21 DIAGNOSIS — I120 Hypertensive chronic kidney disease with stage 5 chronic kidney disease or end stage renal disease: Principal | ICD-10-CM | POA: Diagnosis present

## 2017-12-21 DIAGNOSIS — Z7901 Long term (current) use of anticoagulants: Secondary | ICD-10-CM

## 2017-12-21 DIAGNOSIS — Z01810 Encounter for preprocedural cardiovascular examination: Secondary | ICD-10-CM | POA: Diagnosis not present

## 2017-12-21 DIAGNOSIS — D631 Anemia in chronic kidney disease: Secondary | ICD-10-CM | POA: Diagnosis not present

## 2017-12-21 DIAGNOSIS — Z6835 Body mass index (BMI) 35.0-35.9, adult: Secondary | ICD-10-CM | POA: Diagnosis not present

## 2017-12-21 DIAGNOSIS — R7989 Other specified abnormal findings of blood chemistry: Secondary | ICD-10-CM

## 2017-12-21 DIAGNOSIS — Z888 Allergy status to other drugs, medicaments and biological substances status: Secondary | ICD-10-CM

## 2017-12-21 DIAGNOSIS — N184 Chronic kidney disease, stage 4 (severe): Secondary | ICD-10-CM | POA: Diagnosis not present

## 2017-12-21 DIAGNOSIS — I4510 Unspecified right bundle-branch block: Secondary | ICD-10-CM | POA: Diagnosis present

## 2017-12-21 DIAGNOSIS — E785 Hyperlipidemia, unspecified: Secondary | ICD-10-CM | POA: Diagnosis present

## 2017-12-21 DIAGNOSIS — R011 Cardiac murmur, unspecified: Secondary | ICD-10-CM | POA: Diagnosis not present

## 2017-12-21 DIAGNOSIS — Z79899 Other long term (current) drug therapy: Secondary | ICD-10-CM

## 2017-12-21 DIAGNOSIS — Z833 Family history of diabetes mellitus: Secondary | ICD-10-CM

## 2017-12-21 DIAGNOSIS — Z23 Encounter for immunization: Secondary | ICD-10-CM

## 2017-12-21 DIAGNOSIS — N185 Chronic kidney disease, stage 5: Secondary | ICD-10-CM | POA: Diagnosis not present

## 2017-12-21 DIAGNOSIS — R760 Raised antibody titer: Secondary | ICD-10-CM | POA: Diagnosis present

## 2017-12-21 DIAGNOSIS — F17210 Nicotine dependence, cigarettes, uncomplicated: Secondary | ICD-10-CM | POA: Diagnosis present

## 2017-12-21 DIAGNOSIS — M3214 Glomerular disease in systemic lupus erythematosus: Secondary | ICD-10-CM | POA: Diagnosis not present

## 2017-12-21 DIAGNOSIS — Z8673 Personal history of transient ischemic attack (TIA), and cerebral infarction without residual deficits: Secondary | ICD-10-CM | POA: Diagnosis not present

## 2017-12-21 DIAGNOSIS — N2581 Secondary hyperparathyroidism of renal origin: Secondary | ICD-10-CM | POA: Diagnosis present

## 2017-12-21 DIAGNOSIS — N179 Acute kidney failure, unspecified: Secondary | ICD-10-CM | POA: Diagnosis not present

## 2017-12-21 DIAGNOSIS — E872 Acidosis: Secondary | ICD-10-CM | POA: Diagnosis not present

## 2017-12-21 DIAGNOSIS — I340 Nonrheumatic mitral (valve) insufficiency: Secondary | ICD-10-CM | POA: Diagnosis not present

## 2017-12-21 DIAGNOSIS — I129 Hypertensive chronic kidney disease with stage 1 through stage 4 chronic kidney disease, or unspecified chronic kidney disease: Secondary | ICD-10-CM | POA: Diagnosis not present

## 2017-12-21 DIAGNOSIS — N189 Chronic kidney disease, unspecified: Secondary | ICD-10-CM | POA: Diagnosis not present

## 2017-12-21 DIAGNOSIS — R0789 Other chest pain: Secondary | ICD-10-CM | POA: Diagnosis not present

## 2017-12-21 LAB — URINALYSIS, ROUTINE W REFLEX MICROSCOPIC
Bacteria, UA: NONE SEEN
Bilirubin Urine: NEGATIVE
Glucose, UA: NEGATIVE mg/dL
KETONES UR: NEGATIVE mg/dL
LEUKOCYTES UA: NEGATIVE
Nitrite: NEGATIVE
PH: 6 (ref 5.0–8.0)
Protein, ur: 100 mg/dL — AB
SPECIFIC GRAVITY, URINE: 1.009 (ref 1.005–1.030)

## 2017-12-21 LAB — BASIC METABOLIC PANEL
ANION GAP: 11 (ref 5–15)
BUN: 58 mg/dL — ABNORMAL HIGH (ref 6–20)
CHLORIDE: 104 mmol/L (ref 98–111)
CO2: 22 mmol/L (ref 22–32)
Calcium: 8.4 mg/dL — ABNORMAL LOW (ref 8.9–10.3)
Creatinine, Ser: 9.66 mg/dL — ABNORMAL HIGH (ref 0.61–1.24)
GFR calc non Af Amer: 6 mL/min — ABNORMAL LOW (ref >60)
GFR, EST AFRICAN AMERICAN: 7 mL/min — AB (ref >60)
Glucose, Bld: 95 mg/dL (ref 70–99)
Potassium: 4.4 mmol/L (ref 3.5–5.1)
Sodium: 137 mmol/L (ref 135–145)

## 2017-12-21 LAB — PROTEIN / CREATININE RATIO, URINE
Creatinine, Urine: 87.57 mg/dL
PROTEIN CREATININE RATIO: 2.93 mg/mg{creat} — AB (ref 0.00–0.15)
Total Protein, Urine: 257 mg/dL

## 2017-12-21 LAB — CBC
HEMATOCRIT: 29.5 % — AB (ref 39.0–52.0)
HEMOGLOBIN: 10 g/dL — AB (ref 13.0–17.0)
MCH: 30.9 pg (ref 26.0–34.0)
MCHC: 33.9 g/dL (ref 30.0–36.0)
MCV: 91 fL (ref 78.0–100.0)
Platelets: 171 10*3/uL (ref 150–400)
RBC: 3.24 MIL/uL — AB (ref 4.22–5.81)
RDW: 12.8 % (ref 11.5–15.5)
WBC: 7.8 10*3/uL (ref 4.0–10.5)

## 2017-12-21 LAB — APTT

## 2017-12-21 LAB — TROPONIN I: Troponin I: <0.03 ng/mL (ref <0.03)

## 2017-12-21 LAB — IRON AND TIBC
IRON: 20 ug/dL — AB (ref 45–182)
SATURATION RATIOS: 9 % — AB (ref 17.9–39.5)
TIBC: 228 ug/dL — AB (ref 250–450)
UIBC: 208 ug/dL

## 2017-12-21 LAB — CREATININE, URINE, RANDOM: Creatinine, Urine: 86.75 mg/dL

## 2017-12-21 LAB — FERRITIN: Ferritin: 165 ng/mL (ref 24–336)

## 2017-12-21 LAB — I-STAT TROPONIN, ED: Troponin i, poc: 0.03 ng/mL (ref 0.00–0.08)

## 2017-12-21 LAB — HEPARIN LEVEL (UNFRACTIONATED): HEPARIN UNFRACTIONATED: 2.04 [IU]/mL — AB (ref 0.30–0.70)

## 2017-12-21 LAB — MRSA PCR SCREENING: MRSA BY PCR: NEGATIVE

## 2017-12-21 LAB — PHOSPHORUS: PHOSPHORUS: 5.2 mg/dL — AB (ref 2.5–4.6)

## 2017-12-21 LAB — SODIUM, URINE, RANDOM: Sodium, Ur: 90 mmol/L

## 2017-12-21 MED ORDER — NICOTINE 21 MG/24HR TD PT24
21.0000 mg | MEDICATED_PATCH | Freq: Every day | TRANSDERMAL | Status: DC
  Administered 2017-12-23 – 2017-12-26 (×4): 21 mg via TRANSDERMAL
  Filled 2017-12-21 (×5): qty 1

## 2017-12-21 MED ORDER — CARVEDILOL 3.125 MG PO TABS
3.1250 mg | ORAL_TABLET | Freq: Two times a day (BID) | ORAL | Status: DC
  Administered 2017-12-21 – 2017-12-26 (×11): 3.125 mg via ORAL
  Filled 2017-12-21 (×11): qty 1

## 2017-12-21 MED ORDER — ASPIRIN EC 81 MG PO TBEC
81.0000 mg | DELAYED_RELEASE_TABLET | Freq: Every day | ORAL | Status: DC
  Administered 2017-12-21: 81 mg via ORAL
  Filled 2017-12-21: qty 1

## 2017-12-21 MED ORDER — TECHNETIUM TO 99M ALBUMIN AGGREGATED
4.2000 | Freq: Once | INTRAVENOUS | Status: AC | PRN
  Administered 2017-12-21: 4.2 via INTRAVENOUS

## 2017-12-21 MED ORDER — ACETAMINOPHEN 650 MG RE SUPP: 650.0000 mg | Freq: Four times a day (QID) | RECTAL | Status: DC | PRN

## 2017-12-21 MED ORDER — ONDANSETRON HCL 4 MG PO TABS: 4.0000 mg | ORAL_TABLET | Freq: Four times a day (QID) | ORAL | Status: DC | PRN

## 2017-12-21 MED ORDER — HEPARIN (PORCINE) IN NACL 100-0.45 UNIT/ML-% IJ SOLN
1600.0000 [IU]/h | INTRAMUSCULAR | Status: DC
  Administered 2017-12-21 (×2): 1700 [IU]/h via INTRAVENOUS
  Administered 2017-12-22: 1600 [IU]/h via INTRAVENOUS
  Filled 2017-12-21 (×2): qty 250

## 2017-12-21 MED ORDER — ALBUTEROL SULFATE HFA 108 (90 BASE) MCG/ACT IN AERS: 1.0000 | INHALATION_SPRAY | RESPIRATORY_TRACT | Status: DC | PRN

## 2017-12-21 MED ORDER — AMLODIPINE BESYLATE 10 MG PO TABS
10.0000 mg | ORAL_TABLET | Freq: Every day | ORAL | Status: DC
  Administered 2017-12-21 – 2017-12-22 (×2): 10 mg via ORAL
  Filled 2017-12-21 (×2): qty 1

## 2017-12-21 MED ORDER — ONDANSETRON HCL 4 MG/2ML IJ SOLN: 4.0000 mg | Freq: Four times a day (QID) | INTRAMUSCULAR | Status: DC | PRN

## 2017-12-21 MED ORDER — ACETAMINOPHEN 325 MG PO TABS: 650.0000 mg | ORAL_TABLET | Freq: Four times a day (QID) | ORAL | Status: DC | PRN

## 2017-12-21 MED ORDER — LAMOTRIGINE 100 MG PO TABS
100.0000 mg | ORAL_TABLET | Freq: Three times a day (TID) | ORAL | Status: DC
  Administered 2017-12-21 – 2017-12-26 (×15): 100 mg via ORAL
  Filled 2017-12-21 (×15): qty 1

## 2017-12-21 MED ORDER — PNEUMOCOCCAL VAC POLYVALENT 25 MCG/0.5ML IJ INJ
0.5000 mL | INJECTION | INTRAMUSCULAR | Status: AC
  Administered 2017-12-22: 0.5 mL via INTRAMUSCULAR
  Filled 2017-12-21: qty 0.5

## 2017-12-21 MED ORDER — HYDRALAZINE HCL 20 MG/ML IJ SOLN: 10.0000 mg | INTRAMUSCULAR | Status: DC | PRN

## 2017-12-21 MED ORDER — ALBUTEROL SULFATE (2.5 MG/3ML) 0.083% IN NEBU: 2.5000 mg | INHALATION_SOLUTION | RESPIRATORY_TRACT | Status: DC | PRN

## 2017-12-21 MED ORDER — TECHNETIUM TC 99M DIETHYLENETRIAME-PENTAACETIC ACID
31.0000 | Freq: Once | INTRAVENOUS | Status: AC | PRN
  Administered 2017-12-21: 31 via RESPIRATORY_TRACT

## 2017-12-21 MED ORDER — HEPARIN (PORCINE) IN NACL 100-0.45 UNIT/ML-% IJ SOLN
1950.0000 [IU]/h | INTRAMUSCULAR | Status: DC
  Administered 2017-12-21: 1950 [IU]/h via INTRAVENOUS
  Filled 2017-12-21: qty 250

## 2017-12-21 NOTE — Consult Note (Signed)
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  He was previously under the care of Dr. Marval Regal and recalls being treated with prednisone but does not recall any of his other medications.  He was lost to follow-up "several years ago" when he lost his insurance and continue to intermittently follow-up with his primary care provider-reviewed notes are significant for poor adherence to ongoing treatment.  Presented yesterday night to the [facility] emergency room with a 1 day history of substernal chest pain that was pleuritic in nature but not associated with any cough, sputum production, hemoptysis, fever or chills.  He denies any nausea, vomiting or diarrhea.  He denies any changes in urination and denies any hematuria, flank pain, fever or chills.  He reports to have been taking naproxen and ibuprofen several days a week for chest pain and other body aches.  He was taking furosemide 20 mg prior to admission and was not on RAS blockers.  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  Past Medical History:  Diagnosis Date  . Hypertension   . Lupus ([place])   . Seizures ([place])   . Seizures ([place])   . Stroke ([place])   . Stroke [facility])    43 or 37 years of age.      Past Surgical History:  Procedure Laterality Date  . RENAL BIOPSY      Family History  Problem Relation Age of Onset  . Diabetes Mother   . Diabetes Other     Social History:  reports that he has been smoking.  He has been smoking about 0.25 packs per day. He has  never used smokeless tobacco. He reports that he drinks alcohol. He reports that he does not use drugs.  Allergies:  Allergies  Allergen Reactions  . Zyrtec [Cetirizine]     Facial swelling    Medications:  Scheduled: . amLODipine  10 mg Oral Daily  . aspirin EC  81 mg Oral Daily  . lamoTRIgine  100 mg Oral Q8H    BMP Latest Ref Rng & Units 12/21/2017 12/12/2017 08/22/2017  Glucose 70 - 99 mg/dL 95 92 86  BUN 6 - 20 mg/dL 58(H) 36(H) 30(H)  Creatinine 0.61 - 1.24 mg/dL 9.66(H) 7.17(H) 5.11(H)  BUN/Creat Ratio 9 - 20 - 5(L) 6(L)  Sodium 135 - 145 mmol/L 137 143 140  Potassium 3.5 - 5.1 mmol/L 4.4 4.4 4.7  Chloride 98 - 111 mmol/L 104 108(H) 105  CO2 22 - 32 mmol/L 22 23 25   Calcium 8.9 - 10.3 mg/dL 8.4(L) 8.7 8.7   CBC Latest Ref Rng & Units 12/21/2017 12/12/2017 08/22/2017  WBC 4.0 - 10.5 K/uL 7.8 7.3 6.5  Hemoglobin 13.0 - 17.0 g/dL 10.0(L) 11.6(L) 13.0  Hematocrit 39.0 - 52.0 % 29.5(L) 34.7(L) 38.6  Platelets 150 - 400 K/uL 171 134(L) 117(L)     Dg Chest 2 View  Result Date: 12/21/2017 CLINICAL DATA:  Chest pain and back pain since Monday. Shortness of breath. History of hypertension. Current smoker. EXAM: CHEST - 2 VIEW COMPARISON:  06/29/2017 FINDINGS: The heart size and mediastinal contours are within normal  limits. Both lungs are clear. The visualized skeletal structures are unremarkable. IMPRESSION: No active cardiopulmonary disease. Electronically Signed   By: Lucienne Capers M.D.   On: 12/21/2017 00:59    Review of Systems  Constitutional: Negative.   HENT: Negative.   Respiratory: Negative.   Cardiovascular: Positive for chest pain. Negative for orthopnea, claudication and leg swelling.  Gastrointestinal: Negative.   Genitourinary: Negative.   Musculoskeletal: Positive for myalgias. Negative for back pain, joint pain and neck pain.  Skin: Negative.   Neurological: Positive for headaches. Negative for dizziness and tremors.   Blood pressure (!) 142/96, pulse 81,  temperature 98.6 F (37 C), temperature source Oral, resp. rate 15, height 6\' 1"  (1.854 m), weight 125.2 kg (276 lb), SpO2 99 %. Physical Exam  Nursing note and vitals reviewed. Constitutional: He is oriented to person, place, and time. He appears well-developed and well-nourished. No distress.  HENT:  Head: Normocephalic and atraumatic.  Mouth/Throat: Oropharynx is clear and moist. No oropharyngeal exudate.  Eyes: Pupils are equal, round, and reactive to light. EOM are normal. Left eye exhibits no discharge.  Neck: Normal range of motion. Neck supple. No JVD present. No thyromegaly present.  Cardiovascular: Normal rate, regular rhythm and normal heart sounds.  No murmur heard. Respiratory: Effort normal and breath sounds normal. He has no wheezes. He has no rales.  GI: Soft. Bowel sounds are normal. There is no tenderness. There is no rebound and no guarding.  Musculoskeletal: Normal range of motion. He exhibits no edema.  Neurological: He is alert and oriented to person, place, and time.  Skin: Skin is warm and dry. No erythema.  Psychiatric: He has a normal mood and affect.    Assessment/Plan: 1.  Acute kidney injury on chronic kidney disease stage IV versus progression.  His available labs over the past few months is indicative of rather rapid progression of his chronic kidney disease which indeed may be from his lupus nephritis/hypertensive chronic kidney disease in the setting of poor adherence with ongoing medical management as well as ongoing NSAID use.  I will check urine electrolytes and order renal ultrasound as well as quantify urine protein/creatinine ratio.  He is euvolemic at this time and does not have any indications for hemodialysis.  Continue to avoid iodinated intravenous contrast and I counseled the patient regarding avoiding NSAIDs.  He will be reestablished with outpatient nephrology and at some point may need repeat renal biopsy to assess extent of IFTA and the utility of  treatment with MMF. 2.  Hypertension: Restarted on amlodipine, will add low-dose carvedilol for continued streamlining of blood pressure control. 3.  Anemia: Without overt loss, check iron studies. 4.  Pleuritic chest pain: On heparin bridge, previously taking Xarelto and awaiting ventilation/perfusion scan to assess for PE. 5.  Secondary hyperparathyroidism: We will check phosphorus and PTH level.  [name] Tates K. 12/21/2017, 8:14 AM

## 2017-12-21 NOTE — Plan of Care (Signed)
  Problem: Education: Goal: Knowledge of General Education information will improve Outcome: Progressing   Problem: Health Behavior/Discharge Planning: Goal: Ability to manage health-related needs will improve Outcome: Progressing   Problem: Clinical Measurements: Goal: Ability to maintain clinical measurements within normal limits will improve Outcome: Progressing   

## 2017-12-21 NOTE — Progress Notes (Signed)
ANTICOAGULATION CONSULT NOTE - Initial Consult  Pharmacy Consult for heparin Indication: pulmonary embolus  Allergies  Allergen Reactions  . Zyrtec [Cetirizine]     Facial swelling    Patient Measurements: Height: 6\' 1"  (185.4 cm) Weight: 276 lb (125.2 kg) IBW/kg (Calculated) : 79.9 Heparin Dosing Weight:   Vital Signs: BP: 154/98 (07/06 0300) Pulse Rate: 78 (07/06 0300)  Labs: Recent Labs    12/21/17 0045  HGB 10.0*  HCT 29.5*  PLT 171  CREATININE 9.66*    Estimated Creatinine Clearance: 14.4 mL/min (A) (by C-G formula based on SCr of 9.66 mg/dL (H)).   Medical History: Past Medical History:  Diagnosis Date  . Hypertension   . Lupus ([place])   . Seizures ([place])   . Seizures ([place])   . Stroke ([place])   . Stroke [facility])    37 or 37 years of age.      Medications:   (Not in a hospital admission) Infusions:  . heparin      Assessment: Patient on apixaban prior to admission for CVA/lupus +.  Per ED PA, patient seemed unsure of last dose time for apixaban but reported 7/5, noted history of medicine non-compliance.  Patient now in ED with r/o PE, but imagining not complete yet.  ED PA wished to use heparin to anti-coagulate, in case this was a drug failure vs compliance failure.   PTT ordered with Heparin level until both correlate due to possible drug-lab interaction between oral anticoagulant (rivaroxaban, edoxaban, or apixaban) and anti-Xa level (aka heparin level)  Goal of Therapy:  Heparin level 0.3-0.7 units/ml aPTT 66-102 seconds Monitor platelets by anticoagulation protocol: Yes   Plan:   Heparin drip at 1950  units/hr Daily CBC Next heparin level and PTT at [address] [place] 12/21/2017,3:41 AM

## 2017-12-21 NOTE — H&P (Addendum)
History and Physical    [name] [name] ZOX:[id number] DOB: January 07, 1981 DOA: 12/21/2017  PCP: Alfonse Spruce, FNP  Patient coming from: Home.  Chief Complaint: Chest pain.  HPI: [name] [name] is a 37 y.o. male with history of antiphospholipid antibody syndrome with stroke and has been on anticoagulation since 2003, chronic kidney disease, hypertension presents to the ER with complaints of chest pain.  Patient has been having retrosternal chest pain increasing on deep inspiration since yesterday morning.  Denies nausea vomiting abdominal pain or diarrhea.  Denies any fever chills.  Has been compliant with his medications except that he does not take Lipitor.  ED Course: In the ER patient is chest pain-free at this time.  EKG shows normal sinus rhythm with nonspecific ST-T changes.  Troponin was negative.  Chest x-ray was unremarkable.  Patient was afebrile.  Patient's metabolic panel shows creatinine of 9.6 which is acutely worsened from 7.1 last week and 5.13 months ago.  Patient's creatinine last year July was around 2.9.  ER physician discussed with on-call nephrologist Dr. Posey Pronto who advised to transfer patient to [facility].  UA shows hyaline casts.  No RBCs or WBCs.  Patient states he has been making urine.  Review of Systems: As per HPI, rest all negative.   Past Medical History:  Diagnosis Date  . Hypertension   . Lupus ([place])   . Seizures ([place])   . Seizures ([place])   . Stroke ([place])   . Stroke [facility])    38 or 37 years of age.      Past Surgical History:  Procedure Laterality Date  . RENAL BIOPSY       reports that he has been smoking.  He has been smoking about 0.25 packs per day. He has never used smokeless tobacco. He reports that he drinks alcohol. He reports that he does not use drugs.  Allergies  Allergen Reactions  . Zyrtec [Cetirizine]     Facial swelling    Family History  Problem Relation Age of Onset  . Diabetes Mother   . Diabetes Other     Prior to  Admission medications   Medication Sig Start Date End Date Taking? Authorizing Provider  albuterol (PROVENTIL HFA;VENTOLIN HFA) 108 (90 Base) MCG/ACT inhaler Inhale 1-2 puffs into the lungs every 4 (four) hours as needed for wheezing or shortness of breath (or cough). 06/29/17  Yes Street, Mercedes, PA-C  amLODipine (NORVASC) 10 MG tablet Take 1 tablet (10 mg total) by mouth daily. 12/12/17  Yes Argentina Donovan, PA-C  furosemide (LASIX) 20 MG tablet Take 1 tablet (20 mg total) by mouth daily. 12/16/17  Yes Argentina Donovan, PA-C  LamoTRIgine (LAMICTAL XR) 300 MG TB24 24 hour tablet Take 1 tablet (300 mg total) by mouth daily. 12/12/17  Yes Argentina Donovan, PA-C  rivaroxaban (XARELTO) 20 MG TABS tablet Take 1 tablet (20 mg total) by mouth daily with supper. 08/22/17  Yes Freeman Caldron M, PA-C  atorvastatin (LIPITOR) 40 MG tablet Take 1 tablet (40 mg total) by mouth daily. Patient not taking: Reported on 06/29/2017 12/14/16   Alfonse Spruce, FNP  clotrimazole (LOTRIMIN) 1 % cream Apply to affected area 2 times daily Patient not taking: Reported on 12/21/2017 10/22/17   Franchot Heidelberg, PA-C    Physical Exam: Vitals:   12/21/17 0035 12/21/17 0255 12/21/17 0300 12/21/17 0330  BP:  (!) 148/95 (!) 154/98 (!) 158/105  Pulse:  79 78 80  Resp:  (!) 26 (!) 23 19  SpO2:  95% 100% 100%  Weight: 125.2 kg (276 lb)     Height: 6\' 1"  (1.854 m)         Constitutional: Moderately built and nourished. Vitals:   12/21/17 0035 12/21/17 0255 12/21/17 0300 12/21/17 0330  BP:  (!) 148/95 (!) 154/98 (!) 158/105  Pulse:  79 78 80  Resp:  (!) 26 (!) 23 19  SpO2:  95% 100% 100%  Weight: 125.2 kg (276 lb)     Height: 6\' 1"  (1.854 m)      Eyes: Anicteric no pallor. ENMT: No discharge from the ears eyes nose or mouth. Neck: No mass felt.  No neck rigidity.  No JVD appreciated. Respiratory: No rhonchi or crepitations. Cardiovascular: S1-S2 heard no murmurs appreciated. Abdomen: Soft nontender bowel sounds  present. Musculoskeletal: No edema.  No joint effusion. Skin: No rash.  Skin appears warm. Neurologic: Alert awake oriented to time place and person.  Moves all extremities. Psychiatric: Appears normal per normal affect.   Labs on Admission: I have personally reviewed following labs and imaging studies  CBC: Recent Labs  Lab 12/21/17 0045  WBC 7.8  HGB 10.0*  HCT 29.5*  MCV 91.0  PLT 628   Basic Metabolic Panel: Recent Labs  Lab 12/21/17 0045  NA 137  K 4.4  CL 104  CO2 22  GLUCOSE 95  BUN 58*  CREATININE 9.66*  CALCIUM 8.4*   GFR: Estimated Creatinine Clearance: 14.4 mL/min (A) (by C-G formula based on SCr of 9.66 mg/dL (H)). Liver Function Tests: No results for input(s): AST, ALT, ALKPHOS, BILITOT, PROT, ALBUMIN in the last 168 hours. No results for input(s): LIPASE, AMYLASE in the last 168 hours. No results for input(s): AMMONIA in the last 168 hours. Coagulation Profile: No results for input(s): INR, PROTIME in the last 168 hours. Cardiac Enzymes: No results for input(s): CKTOTAL, CKMB, CKMBINDEX, TROPONINI in the last 168 hours. BNP (last 3 results) No results for input(s): PROBNP in the last 8760 hours. HbA1C: No results for input(s): HGBA1C in the last 72 hours. CBG: No results for input(s): GLUCAP in the last 168 hours. Lipid Profile: No results for input(s): CHOL, HDL, LDLCALC, TRIG, CHOLHDL, LDLDIRECT in the last 72 hours. Thyroid Function Tests: No results for input(s): TSH, T4TOTAL, FREET4, T3FREE, THYROIDAB in the last 72 hours. Anemia Panel: No results for input(s): VITAMINB12, FOLATE, FERRITIN, TIBC, IRON, RETICCTPCT in the last 72 hours. Urine analysis:    Component Value Date/Time   COLORURINE STRAW (A) 12/21/2017 0344   APPEARANCEUR CLEAR 12/21/2017 0344   LABSPEC 1.009 12/21/2017 0344   PHURINE 6.0 12/21/2017 0344   GLUCOSEU NEGATIVE 12/21/2017 0344   HGBUR SMALL (A) 12/21/2017 0344   BILIRUBINUR NEGATIVE 12/21/2017 0344   KETONESUR  NEGATIVE 12/21/2017 0344   PROTEINUR 100 (A) 12/21/2017 0344   UROBILINOGEN 0.2 05/09/2013 1857   NITRITE NEGATIVE 12/21/2017 0344   LEUKOCYTESUR NEGATIVE 12/21/2017 0344   Sepsis Labs: @LABRCNTIP (procalcitonin:4,lacticidven:4) )No results found for this or any previous visit (from the past 240 hour(s)).   Radiological Exams on Admission: Dg Chest 2 View  Result Date: 12/21/2017 CLINICAL DATA:  Chest pain and back pain since Monday. Shortness of breath. History of hypertension. Current smoker. EXAM: CHEST - 2 VIEW COMPARISON:  06/29/2017 FINDINGS: The heart size and mediastinal contours are within normal limits. Both lungs are clear. The visualized skeletal structures are unremarkable. IMPRESSION: No active cardiopulmonary disease. Electronically Signed   By: Lucienne Capers M.D.   On: 12/21/2017 00:59  EKG: Independently reviewed.  Normal sinus rhythm with nonspecific changes.  Assessment/Plan Active Problems:   Lupus anticoagulant positive   HTN (hypertension)   Seizures (HCC)   ARF (acute renal failure) ([place])    1. Acute on chronic kidney disease stage V -with progressive worsening of kidney function.  Patient states he is making urine.  UA shows some hyaline casts but no WBC or RBC.  Will closely follow intake output metabolic panel get renal sonogram.  Nephrology has been consulted.  Will hold Lasix.  Avoid any nephrotoxins.  Check SPEP CK levels. 2. Chest pain appears pleuritic in nature -no definite signs of any pericarditis.  Given the worsening renal function Xarelto is on hold and placed on heparin.  Will get VQ scan check 2D echo cycle cardiac markers keep patient on aspirin. 3. History of antiphospholipid antibody syndrome with history of stroke on Xarelto.  Xarelto held due to worsening renal function and kept on heparin.  Not sure why patient stopped taking Lipitor. 4. Hypertension uncontrolled on amlodipine.  Will add PRN IV hydralazine in addition to his  amlodipine. 5. Anemia likely chronic from renal disease.  Follow CBC. 6. History of recurrent thrombocytopenia follow CBC closely.  Patient afebrile.   DVT prophylaxis: Heparin infusion. Code Status: Full code. Family Communication: Discussed with patient. Disposition Plan: Home. Consults called: Nephrology. Admission status: Inpatient.   Rise Patience MD [facility] Pager [phone].  If 7PM-7AM, please contact night-coverage [email or web address] Password [email or web address]  12/21/2017, 5:45 AM

## 2017-12-21 NOTE — Progress Notes (Signed)
VASCULAR LAB PRELIMINARY  PRELIMINARY  PRELIMINARY  PRELIMINARY  Bilateral lower extremity venous duplex completed.    Preliminary report:  There is no DVT or SVT noted in the bilateral lower extremities.   Kethan Papadopoulos, RVT 12/21/2017, 5:12 PM

## 2017-12-21 NOTE — Progress Notes (Signed)
PROGRESS NOTE    [name] [name]  PRF:[id number] DOB: Apr 20, 1981 DOA: 12/21/2017 PCP: Alfonse Spruce, FNP    Brief Narrative:  37 year old male who presented with chest pain.  He does have the significant past medical history of antiphospholipid antibody syndrome, history of CVA, chronic kidney disease stage IV, and hypertension.  Patient reported retrosternal chest pain, pleuritic in nature, for 4 to 5 days prior to hospitalization, pressure in nature with no radiation, and no improving factors. Associated with right ankle edema. He has been not compliant with anticoagulation at home. Not physically active. On his initial physical examination blood pressure 148/95, heart rate 79, respiratory rate 26, oxygen saturation 95%.  Moist mucous membranes, lungs clear to auscultation bilaterally, heart S1-S2 present rhythmic, abdomen soft nontender, no lower extremity edema.  Sodium 137, potassium 4.4, chloride 104, bicarb 22, glucose 95, BUN 58, creatinine 9.6, white count 7.8, hemoglobin 10.0, hematocrit 39.5, platelets 171, point-of-care troponin 0.03.  Urine sodium 90, specific gravity 1.009, 100 protein, RBC 0-5, white cells 0-5, protein creatinine ratio 2.9.  Chest x-ray with prominent hilar regions.  Sinus rhythm, right axis deviation, right bundle Bischoff block, no ST or T wave changes.   Patient was admitted to the hospital with working diagnosis of atypical chest pain, in the setting of progressive chronic kidney disease, acute kidney injury on chronic kidney disease stage 5.  Assessment & Plan:   Active Problems:   Lupus anticoagulant positive   HTN (hypertension)   Seizures (HCC)   ARF (acute renal failure) ([place])   1. Atypical chest pain. No further chest pain this am, need to rule out pulmonary embolism, will continue heparin drip for now and will follow on V/Q scan, will add lower extremity dopplers. Due to worsening renal function and GFR less than 30, will need to continue  anticoagulation with warfarin. Follow on echocardiography, noted new right bundle brunch block with right axis deviation on EKG.   2. AKI on CKD stage IV / Lupus nephritis. Worsening renal function with serum cr up to 9,66, K at 4,4 and serum bicarbonate at 22, with BUN 58. No signs of volume overload or uremia, no need for urgent renal replacement therapy. Positive proteinuria, non nephrotic. Old records personally reviewed, renal biopsy in 2010 with combined focal proliferative and early sclerosing lupus nephritis and membranous lupus nephritis. Mild arteriosclerosis with mild interstitial fibrosis and tubular atrophy.  Will follow on urine output and nephrology recommendations.   3. HTN. Blood pressure control with amlodipine and carvedilol.   4. Anemia, suspected chronic renal disease. Hb at 10 with hct at 29, will continue to follow cell count as needed, follow on iron panel.   5. Seizures. Will continue lamotrigine per his home regimen.   DVT prophylaxis: heparin   Code Status: full Family Communication: no family at the bedside  Disposition Plan/ discharge barriers: patient will need therapeutic INR before discharge if he is started on warfarin. Transfer to telemetry.    Consultants:   Nephrology   Procedures:     Antimicrobials:       Subjective: Patient is feeling better, no chest pain or dyspnea, no nausea or vomiting.   Objective: Vitals:   12/21/17 0300 12/21/17 0330 12/21/17 0605 12/21/17 0700  BP: (!) 154/98 (!) 158/105 (!) 146/96 (!) 142/96  Pulse: 78 80 77 81  Resp: (!) 23 19 16 15   Temp:    98.6 F (37 C)  TempSrc:    Oral  SpO2: 100% 100% 98% 99%  Weight:      Height:        Intake/Output Summary (Last 24 hours) at 12/21/2017 1046 Last data filed at 12/21/2017 1000 Gross per 24 hour  Intake 110.18 ml  Output -  Net 110.18 ml   Filed Weights   12/21/17 0035  Weight: 125.2 kg (276 lb)    Examination:   General: Not in pain or  dyspnea Neurology: Awake and alert, non focal  E ENT: no pallor, no icterus, oral mucosa moist Cardiovascular: No JVD. S1-S2 present, rhythmic, no gallops, rubs, or murmurs. ++ non pitting lower extremity edema. Pulmonary: vesicular breath sounds bilaterally, adequate air movement, no wheezing, rhonchi or rales. Gastrointestinal. Abdomen with no organomegaly, non tender, no rebound or guarding Skin. No rashes Musculoskeletal: no joint deformities     Data Reviewed: I have personally reviewed following labs and imaging studies  CBC: Recent Labs  Lab 12/21/17 0045  WBC 7.8  HGB 10.0*  HCT 29.5*  MCV 91.0  PLT 924   Basic Metabolic Panel: Recent Labs  Lab 12/21/17 0045  NA 137  K 4.4  CL 104  CO2 22  GLUCOSE 95  BUN 58*  CREATININE 9.66*  CALCIUM 8.4*   GFR: Estimated Creatinine Clearance: 14.4 mL/min (A) (by C-G formula based on SCr of 9.66 mg/dL (H)). Liver Function Tests: No results for input(s): AST, ALT, ALKPHOS, BILITOT, PROT, ALBUMIN in the last 168 hours. No results for input(s): LIPASE, AMYLASE in the last 168 hours. No results for input(s): AMMONIA in the last 168 hours. Coagulation Profile: No results for input(s): INR, PROTIME in the last 168 hours. Cardiac Enzymes: No results for input(s): CKTOTAL, CKMB, CKMBINDEX, TROPONINI in the last 168 hours. BNP (last 3 results) No results for input(s): PROBNP in the last 8760 hours. HbA1C: No results for input(s): HGBA1C in the last 72 hours. CBG: No results for input(s): GLUCAP in the last 168 hours. Lipid Profile: No results for input(s): CHOL, HDL, LDLCALC, TRIG, CHOLHDL, LDLDIRECT in the last 72 hours. Thyroid Function Tests: No results for input(s): TSH, T4TOTAL, FREET4, T3FREE, THYROIDAB in the last 72 hours. Anemia Panel: No results for input(s): VITAMINB12, FOLATE, FERRITIN, TIBC, IRON, RETICCTPCT in the last 72 hours.    Radiology Studies: I have reviewed all of the imaging during this hospital  visit personally     Scheduled Meds: . amLODipine  10 mg Oral Daily  . aspirin EC  81 mg Oral Daily  . carvedilol  3.125 mg Oral BID WC  . lamoTRIgine  100 mg Oral Q8H  . [START ON 12/22/2017] pneumococcal 23 valent vaccine  0.5 mL Intramuscular Tomorrow-1000   Continuous Infusions: . heparin 1,950 Units/hr (12/21/17 0421)     LOS: 0 days        Mauricio Gerome Apley, MD [facility] Pager [phone]

## 2017-12-21 NOTE — ED Notes (Signed)
ED PA at bedside

## 2017-12-21 NOTE — ED Notes (Signed)
Patient ambulatory to restroom with steady gait.

## 2017-12-21 NOTE — ED Triage Notes (Signed)
Patient complaining of mid chest pain that started on Monday and has not gotten better.

## 2017-12-21 NOTE — ED Provider Notes (Signed)
[facility] DEPT Provider Note   CSN: [id number] Arrival date & time: 12/21/17  0018     History   Chief Complaint Chief Complaint  Patient presents with  . Chest Pain    HPI [name] [name] is a 37 y.o. male.  The history is provided by the patient and medical records.  Chest Pain      37 year old male with history of hypertension, lupus, seizure disorder, prior stroke, chronic kidney disease, presenting to the ED for chest pain.  Patient reports Monday he started having central chest pain was returning from the beach.  States he was at [facility], approximately 4 to 5-hour drive from here.  Reports pain when taking deep breath in the center of his chest and upper back.  Denies any significant SOB, palpitations, dizziness, weakness, diaphoresis, nausea, or vomiting.  Patient is on Xarelto, reports he has been compliant.  He has no known history of coronary artery disease, DVT, or PE.  He is a smoker, only smokes about 3 to 4 cigarettes daily.  Past Medical History:  Diagnosis Date  . Hypertension   . Lupus ([place])   . Seizures ([place])   . Seizures ([place])   . Stroke ([place])   . Stroke [facility])    30 or 15 years of age.      Patient Active Problem List   Diagnosis Date Noted  . CKD (chronic kidney disease) stage 3, GFR 30-59 ml/min (HCC) 10/24/2015  . Seizures ([place]) 10/24/2015  . HTN (hypertension) 01/12/2015  . Chronic anticoagulation 04/24/2011  . Lupus anticoagulant positive 04/24/2011  . OBESITY, NOS 08/15/2006  . THROMBOCYTOPENIA 08/15/2006  . TOBACCO DEPENDENCE 08/15/2006  . CVA 08/15/2006    Past Surgical History:  Procedure Laterality Date  . RENAL BIOPSY          Home Medications    Prior to Admission medications   Medication Sig Start Date End Date Taking? Authorizing Provider  albuterol (PROVENTIL HFA;VENTOLIN HFA) 108 (90 Base) MCG/ACT inhaler Inhale 1-2 puffs into the lungs every 4 (four) hours as needed for wheezing or  shortness of breath (or cough). 06/29/17   Street, Mercedes, PA-C  amLODipine (NORVASC) 10 MG tablet Take 1 tablet (10 mg total) by mouth daily. 12/12/17   Argentina Donovan, PA-C  atorvastatin (LIPITOR) 40 MG tablet Take 1 tablet (40 mg total) by mouth daily. Patient not taking: Reported on 06/29/2017 12/14/16   Alfonse Spruce, FNP  clotrimazole (LOTRIMIN) 1 % cream Apply to affected area 2 times daily 10/22/17   Caccavale, Sophia, PA-C  furosemide (LASIX) 20 MG tablet Take 1 tablet (20 mg total) by mouth daily. 12/16/17   Argentina Donovan, PA-C  LamoTRIgine (LAMICTAL XR) 300 MG TB24 24 hour tablet Take 1 tablet (300 mg total) by mouth daily. 12/12/17   Argentina Donovan, PA-C  nicotine polacrilex (NICORETTE) 4 MG gum Take 1 each (4 mg total) by mouth as needed for smoking cessation. Patient not taking: Reported on 06/29/2017 12/10/16   Alfonse Spruce, FNP  rivaroxaban (XARELTO) 20 MG TABS tablet Take 1 tablet (20 mg total) by mouth daily with supper. 08/22/17   Argentina Donovan, PA-C    Family History Family History  Problem Relation Age of Onset  . Diabetes Mother   . Diabetes Other     Social History Social History   Tobacco Use  . Smoking status: Current Every Day Smoker    Packs/day: 0.25  . Smokeless tobacco: Never Used  Substance Use Topics  . Alcohol use: Yes  . Drug use: No     Allergies   Zyrtec [cetirizine]   Review of Systems Review of Systems  Cardiovascular: Positive for chest pain.  All other systems reviewed and are negative.    Physical Exam Updated Vital Signs BP (!) 154/98   Pulse 78   Resp (!) 23   Ht 6\' 1"  (1.854 m)   Wt 125.2 kg (276 lb)   SpO2 100%   BMI 36.41 kg/m   Physical Exam  Constitutional: He is oriented to person, place, and time. He appears well-developed and well-nourished.  HENT:  Head: Normocephalic and atraumatic.  Mouth/Throat: Oropharynx is clear and moist.  Eyes: Pupils are equal, round, and reactive to light.  Conjunctivae and EOM are normal.  Neck: Normal range of motion.  Cardiovascular: Normal rate, regular rhythm and normal heart sounds.  Pulmonary/Chest: Effort normal and breath sounds normal. He has no decreased breath sounds. He has no wheezes. He has no rales.  Abdominal: Soft. Bowel sounds are normal.  Musculoskeletal: Normal range of motion.  Neurological: He is alert and oriented to person, place, and time.  Skin: Skin is warm and dry.  Psychiatric: He has a normal mood and affect.  Nursing note and vitals reviewed.    ED Treatments / Results  Labs (all labs ordered are listed, but only abnormal results are displayed) Labs Reviewed  BASIC METABOLIC PANEL - Abnormal; Notable for the following components:      Result Value   BUN 58 (*)    Creatinine, Ser 9.66 (*)    Calcium 8.4 (*)    GFR calc non Af Amer 6 (*)    GFR calc Af Amer 7 (*)    All other components within normal limits  CBC - Abnormal; Notable for the following components:   RBC 3.24 (*)    Hemoglobin 10.0 (*)    HCT 29.5 (*)    All other components within normal limits  I-STAT TROPONIN, ED    EKG EKG Interpretation  Date/Time:  Saturday December 21 2017 00:44:08 EDT Ventricular Rate:  81 PR Interval:    QRS Duration: 108 QT Interval:  368 QTC Calculation: 428 R Axis:   75 Text Interpretation:  Sinus rhythm RSR' in V1 or V2, right VCD or RVH Inferior infarct, old Lateral leads are also involved Since last EKG, RBBB is new but has been noted previously in Aug 2010 Confirmed by Duffy Bruce [phone]) on 12/21/2017 2:45:00 AM   Radiology Dg Chest 2 View  Result Date: 12/21/2017 CLINICAL DATA:  Chest pain and back pain since Monday. Shortness of breath. History of hypertension. Current smoker. EXAM: CHEST - 2 VIEW COMPARISON:  06/29/2017 FINDINGS: The heart size and mediastinal contours are within normal limits. Both lungs are clear. The visualized skeletal structures are unremarkable. IMPRESSION: No active  cardiopulmonary disease. Electronically Signed   By: Lucienne Capers M.D.   On: 12/21/2017 00:59    Procedures Procedures (including critical care time)  Medications Ordered in ED Medications - No data to display   Initial Impression / Assessment and Plan / ED Course  I have reviewed the triage vital signs and the nursing notes.  Pertinent labs & imaging results that were available during my care of the patient were reviewed by me and considered in my medical decision making (see chart for details).  37 year old male here with central, pleuritic type chest pain, worse with deep breathing.  This began after a  car ride home from the beach.  He is afebrile and nontoxic.  Chest wall is nontender.  Vital signs are overall stable, slight tachypnea noted but in no acute distress.  Patient has history of lupus and symptoms are concerning for PE.  He is on Xarelto, but per chart review he is historically noncompliant with his anticoagulation.  EKG with RBBB which was seen previously.  Troponin is negative.  Patient's kidney function has increased precipitously over the past few months, was 5.11 in March 2019, up to 7.17 on 12/12/17, now 9.66 today.  Patient reports he was previously following with nephrology, Dr. Meredeth Ide, however has not been seen there in over a year.  He was referred back by his PCP but does not have any appointments scheduled thus far.  At this point, given patient's pleuritic type chest pain after long car ride, history of lupus, and history of noncompliance with anticoagulation, PE is definitely a consideration.  Given his renal function, cannot obtain a CTA.  I feel best course of action would be to start heparin, get VQ scan in the morning.  Has consulted with nephrology, Dr. Posey Pronto-- prefers transfer to [facility], their team will see in the AM and provide recommendations.  Discussed with Dr. Hal Hope-- will evaluate in the ED and arrange transfer to [place].  Final Clinical  Impressions(s) / ED Diagnoses   Final diagnoses:  Chest pain in adult  Elevated serum creatinine    ED Discharge Orders    None       Larene Pickett, PA-C 12/21/17 2542    Duffy Bruce, MD 12/21/17 [phone]

## 2017-12-21 NOTE — Progress Notes (Signed)
[place] for heparin Indication: r/o DVT, hx cva/lupus AC  Allergies  Allergen Reactions  . Zyrtec [Cetirizine]     Facial swelling    Patient Measurements: Height: 6\' 1"  (185.4 cm) Weight: 276 lb (125.2 kg) IBW/kg (Calculated) : 79.9 Heparin Dosing Weight:   Vital Signs: Temp: 98 F (36.7 C) (07/06 1100) Temp Source: Oral (07/06 1100) BP: 149/103 (07/06 1100) Pulse Rate: 75 (07/06 1100)  Labs: Recent Labs    12/21/17 0045  HGB 10.0*  HCT 29.5*  PLT 171  CREATININE 9.66*    Estimated Creatinine Clearance: 14.4 mL/min (A) (by C-G formula based on SCr of 9.66 mg/dL (H)).   Medical History: Past Medical History:  Diagnosis Date  . Hypertension   . Lupus ([place])   . Seizures ([place])   . Seizures ([place])   . Stroke ([place])   . Stroke [facility])    58 or 37 years of age.      Medications:  Medications Prior to Admission  Medication Sig Dispense Refill Last Dose  . albuterol (PROVENTIL HFA;VENTOLIN HFA) 108 (90 Base) MCG/ACT inhaler Inhale 1-2 puffs into the lungs every 4 (four) hours as needed for wheezing or shortness of breath (or cough). 1 Inhaler 0 Taking  . amLODipine (NORVASC) 10 MG tablet Take 1 tablet (10 mg total) by mouth daily. 90 tablet 3 12/20/2017 at Unknown time  . furosemide (LASIX) 20 MG tablet Take 1 tablet (20 mg total) by mouth daily. 30 tablet 3 12/20/2017 at Unknown time  . LamoTRIgine (LAMICTAL XR) 300 MG TB24 24 hour tablet Take 1 tablet (300 mg total) by mouth daily. 30 tablet 5 12/20/2017 at Unknown time  . rivaroxaban (XARELTO) 20 MG TABS tablet Take 1 tablet (20 mg total) by mouth daily with supper. 30 tablet 2 12/20/2017 at Unknown time  . atorvastatin (LIPITOR) 40 MG tablet Take 1 tablet (40 mg total) by mouth daily. (Patient not taking: Reported on 06/29/2017) 30 tablet 2 Not Taking at Unknown time  . clotrimazole (LOTRIMIN) 1 % cream Apply to affected area 2 times daily (Patient not taking: Reported on 12/21/2017) 15 g  0 Not Taking at Unknown time   Infusions:  . heparin 1,950 Units/hr (12/21/17 0421)    Assessment: Patient on apixaban prior to admission for CVA/lupus +.  Per ED PA, patient seemed unsure of last dose time for apixaban but reported 7/5, noted history of medicine non-compliance.  Patient now in ED with r/o PE, but imagining not complete yet.  ED PA wished to use heparin to anti-coagulate, in case this was a drug failure vs compliance failure.   Heparin level elevated likely due to recent xarelto use, aptt also elevated, labs drawn on opposite arm. D/w nurse will hold x 1h then decrease rate to 1700/hr.  VQ scan was negative.   Goal of Therapy:  Heparin level 0.3-0.7 units/ml aPTT 66-102 seconds Monitor platelets by anticoagulation protocol: Yes   Plan:  Heparin drip at 1700  units/hr Daily CBC Aptt tonight Follow up dopplers   Erin Hearing PharmD., BCPS Clinical Pharmacist 12/21/2017 1:34 PM

## 2017-12-21 NOTE — ED Notes (Signed)
[facility] here for transport.  

## 2017-12-22 ENCOUNTER — Inpatient Hospital Stay (HOSPITAL_COMMUNITY): Payer: BLUE CROSS/BLUE SHIELD

## 2017-12-22 DIAGNOSIS — I340 Nonrheumatic mitral (valve) insufficiency: Secondary | ICD-10-CM

## 2017-12-22 LAB — HEPARIN LEVEL (UNFRACTIONATED): Heparin Unfractionated: 0.47 IU/mL (ref 0.30–0.70)

## 2017-12-22 LAB — RENAL FUNCTION PANEL
ALBUMIN: 2.8 g/dL — AB (ref 3.5–5.0)
ANION GAP: 7 (ref 5–15)
BUN: 60 mg/dL — ABNORMAL HIGH (ref 6–20)
CO2: 23 mmol/L (ref 22–32)
Calcium: 8.3 mg/dL — ABNORMAL LOW (ref 8.9–10.3)
Chloride: 106 mmol/L (ref 98–111)
Creatinine, Ser: 9.52 mg/dL — ABNORMAL HIGH (ref 0.61–1.24)
GFR calc Af Amer: 7 mL/min — ABNORMAL LOW (ref >60)
GFR calc non Af Amer: 6 mL/min — ABNORMAL LOW (ref >60)
GLUCOSE: 85 mg/dL (ref 70–99)
PHOSPHORUS: 5.8 mg/dL — AB (ref 2.5–4.6)
POTASSIUM: 4.3 mmol/L (ref 3.5–5.1)
Sodium: 136 mmol/L (ref 135–145)

## 2017-12-22 LAB — ECHOCARDIOGRAM COMPLETE
Height: 73 in
WEIGHTICAEL: 4345.71 [oz_av]

## 2017-12-22 LAB — CBC
HCT: 28.2 % — ABNORMAL LOW (ref 39.0–52.0)
Hemoglobin: 9.3 g/dL — ABNORMAL LOW (ref 13.0–17.0)
MCH: 30.2 pg (ref 26.0–34.0)
MCHC: 33 g/dL (ref 30.0–36.0)
MCV: 91.6 fL (ref 78.0–100.0)
PLATELETS: 144 10*3/uL — AB (ref 150–400)
RBC: 3.08 MIL/uL — ABNORMAL LOW (ref 4.22–5.81)
RDW: 12.4 % (ref 11.5–15.5)
WBC: 7.3 10*3/uL (ref 4.0–10.5)

## 2017-12-22 LAB — BASIC METABOLIC PANEL
Anion gap: 10 (ref 5–15)
BUN: 60 mg/dL — ABNORMAL HIGH (ref 6–20)
CALCIUM: 8.5 mg/dL — AB (ref 8.9–10.3)
CO2: 23 mmol/L (ref 22–32)
CREATININE: 9.21 mg/dL — AB (ref 0.61–1.24)
Chloride: 104 mmol/L (ref 98–111)
GFR, EST AFRICAN AMERICAN: 7 mL/min — AB (ref >60)
GFR, EST NON AFRICAN AMERICAN: 6 mL/min — AB (ref >60)
Glucose, Bld: 88 mg/dL (ref 70–99)
Potassium: 4.2 mmol/L (ref 3.5–5.1)
Sodium: 137 mmol/L (ref 135–145)

## 2017-12-22 LAB — APTT
APTT: 114 s — AB (ref 24–36)
aPTT: 141 seconds — ABNORMAL HIGH (ref 24–36)
aPTT: 87 seconds — ABNORMAL HIGH (ref 24–36)

## 2017-12-22 LAB — PARATHYROID HORMONE, INTACT (NO CA): PTH: 199 pg/mL — AB (ref 15–65)

## 2017-12-22 LAB — PROTIME-INR
INR: 1.14
Prothrombin Time: 14.6 seconds (ref 11.4–15.2)

## 2017-12-22 MED ORDER — SODIUM CHLORIDE 0.9 % IV SOLN
510.0000 mg | INTRAVENOUS | Status: AC
  Administered 2017-12-22 – 2017-12-25 (×2): 510 mg via INTRAVENOUS
  Filled 2017-12-22 (×2): qty 17

## 2017-12-22 MED ORDER — CALCIUM ACETATE (PHOS BINDER) 667 MG PO CAPS
667.0000 mg | ORAL_CAPSULE | Freq: Three times a day (TID) | ORAL | Status: DC
  Administered 2017-12-22 – 2017-12-26 (×12): 667 mg via ORAL
  Filled 2017-12-22 (×12): qty 1

## 2017-12-22 MED ORDER — HEPARIN (PORCINE) IN NACL 100-0.45 UNIT/ML-% IJ SOLN
2150.0000 [IU]/h | INTRAMUSCULAR | Status: DC
  Administered 2017-12-23 – 2017-12-24 (×2): 1300 [IU]/h via INTRAVENOUS
  Administered 2017-12-24: 1950 [IU]/h via INTRAVENOUS
  Administered 2017-12-25 (×2): 2150 [IU]/h via INTRAVENOUS
  Filled 2017-12-22 (×5): qty 250

## 2017-12-22 NOTE — Progress Notes (Signed)
PROGRESS NOTE    [name] [name]  PYK:[id number] DOB: February 02, 1981 DOA: 12/21/2017 PCP: Alfonse Spruce, FNP    Brief Narrative:  37 year old male who presented with chest pain.  He does have the significant past medical history of antiphospholipid antibody syndrome, history of CVA, chronic kidney disease stage IV, and hypertension.  Patient reported retrosternal chest pain, pleuritic in nature, for 4 to 5 days prior to hospitalization, pressure in nature with no radiation, and no improving factors. Associated with right ankle edema. He has been not compliant with anticoagulation at home. Not physically active. On his initial physical examination blood pressure 148/95, heart rate 79, respiratory rate 26, oxygen saturation 95%.  Moist mucous membranes, lungs clear to auscultation bilaterally, heart S1-S2 present rhythmic, abdomen soft nontender, no lower extremity edema.  Sodium 137, potassium 4.4, chloride 104, bicarb 22, glucose 95, BUN 58, creatinine 9.6, white count 7.8, hemoglobin 10.0, hematocrit 39.5, platelets 171, point-of-care troponin 0.03.  Urine sodium 90, specific gravity 1.009, 100 protein, RBC 0-5, white cells 0-5, protein creatinine ratio 2.9.  Chest x-ray with prominent hilar regions.  Sinus rhythm, right axis deviation, right bundle Garringer block, no ST or T wave changes.   Patient was admitted to the hospital with working diagnosis of atypical chest pain, in the setting of progressive chronic kidney disease, acute kidney injury on chronic kidney disease stage 5.  Assessment & Plan:   Active Problems:   Lupus anticoagulant positive   HTN (hypertension)   Seizures (HCC)   ARF (acute renal failure) ([place])  1. Atypical chest pain. No further chest pain, ACS has been ruled out, will discontinue telemetry. Negating lower extremity dopplers for DVT and low probability V/Q scan for pulmonary embolism.   2. Antiphospholipid antibody. Will continue anticoagulation with heparin and will  likely will need warfarin at discharge if patient is not started on renal replacement therapy on this admission.   2. AKI on CKD stage IV / Lupus nephritis/ Bx: combined focal proliferative and early sclerosing lupus nephritis and membranous lupus nephritis. Mild arteriosclerosis with mild interstitial fibrosis and tubular atrophy. Progressive renal disease likely now stage V, for vein mapping on this admission, will follow on nephrology recommendations in regards of starting renal replacement therapy on this admission or follow up as outpatient. K at 4,3 with serum bicarbonate at 23, no clinical signs of volume overload. Elevated phosphorus, up to 5,8 with ca at 8.3 will add phosphate binders and follow on PTH.   3. HTN. Continue amlodipine and carvedilol for blood pressure control.   4. Combined anemia of iron deficiency and anemia of chronic renal disease. Serum Iron 20 with TIBC 228, ferritin 165 with transferrin saturation of 9. Patient had IV iron ordered, need follow as outpatient, target hb 11.   5. Seizures. On lamotrigine for seizure control.   6. Tobacco abuse. Smoking cessation, started on nicotine patch.   DVT prophylaxis: heparin   Code Status: full Family Communication: no family at the bedside  Disposition Plan/ discharge barriers: patient will need therapeutic INR before discharge if no renal replacement therapy on this admission. Transfer to medical unit.    Consultants:   Nephrology   Procedures:     Antimicrobials:   Subjective: Patient with no further chest pain, no nausea or vomiting. Tolerating po well.   Objective: Vitals:   12/21/17 2321 12/22/17 0309 12/22/17 0557 12/22/17 0800  BP: 132/81 (!) 125/92  118/77  Pulse: 76 82  83  Resp: 16 17  17   Temp:  98.1 F (36.7 C) 98.3 F (36.8 C)  98.2 F (36.8 C)  TempSrc: Oral Oral  Oral  SpO2: 99% 98%  98%  Weight:   123.2 kg (271 lb 9.7 oz)   Height:        Intake/Output Summary (Last 24 hours)  at 12/22/2017 1207 Last data filed at 12/22/2017 1045 Gross per 24 hour  Intake 1459.63 ml  Output 1200 ml  Net 259.63 ml   Filed Weights   12/21/17 0035 12/21/17 0700 12/22/17 0557  Weight: 125.2 kg (276 lb) 124.4 kg (274 lb 3.2 oz) 123.2 kg (271 lb 9.7 oz)    Examination:   General: Not in pain or dyspnea.  Neurology: Awake and alert, non focal  E ENT: no pallor, no icterus, oral mucosa moist Cardiovascular: No JVD. S1-S2 present, rhythmic, no gallops, rubs, or murmurs. Trace non pitting lower extremity edema. Pulmonary: vesicular breath sounds bilaterally, adequate air movement, no wheezing, rhonchi or rales. Gastrointestinal. Abdomen with no organomegaly, non tender, no rebound or guarding Skin. No rashes Musculoskeletal: no joint deformities     Data Reviewed: I have personally reviewed following labs and imaging studies  CBC: Recent Labs  Lab 12/21/17 0045 12/22/17 0701  WBC 7.8 7.3  HGB 10.0* 9.3*  HCT 29.5* 28.2*  MCV 91.0 91.6  PLT 171 831*   Basic Metabolic Panel: Recent Labs  Lab 12/21/17 0045 12/21/17 1217 12/22/17 0701  NA 137  --  136  137  K 4.4  --  4.3  4.2  CL 104  --  106  104  CO2 22  --  23  23  GLUCOSE 95  --  85  88  BUN 58*  --  60*  60*  CREATININE 9.66*  --  9.52*  9.21*  CALCIUM 8.4*  --  8.3*  8.5*  PHOS  --  5.2* 5.8*   GFR: Estimated Creatinine Clearance: 15 mL/min (A) (by C-G formula based on SCr of 9.21 mg/dL (H)). Liver Function Tests: Recent Labs  Lab 12/22/17 0701  ALBUMIN 2.8*   No results for input(s): LIPASE, AMYLASE in the last 168 hours. No results for input(s): AMMONIA in the last 168 hours. Coagulation Profile: Recent Labs  Lab 12/22/17 0701  INR 1.14   Cardiac Enzymes: Recent Labs  Lab 12/21/17 1217  TROPONINI <0.03   BNP (last 3 results) No results for input(s): PROBNP in the last 8760 hours. HbA1C: No results for input(s): HGBA1C in the last 72 hours. CBG: No results for input(s): GLUCAP  in the last 168 hours. Lipid Profile: No results for input(s): CHOL, HDL, LDLCALC, TRIG, CHOLHDL, LDLDIRECT in the last 72 hours. Thyroid Function Tests: No results for input(s): TSH, T4TOTAL, FREET4, T3FREE, THYROIDAB in the last 72 hours. Anemia Panel: Recent Labs    12/21/17 1217  FERRITIN 165  TIBC 228*  IRON 20*      Radiology Studies: I have reviewed all of the imaging during this hospital visit personally     Scheduled Meds: . amLODipine  10 mg Oral Daily  . carvedilol  3.125 mg Oral BID WC  . lamoTRIgine  100 mg Oral Q8H  . nicotine  21 mg Transdermal Daily   Continuous Infusions: . ferumoxytol 510 mg (12/22/17 0906)  . heparin 1,300 Units/hr (12/22/17 1200)     LOS: 1 day        [name] Roache Gerome Apley, MD [facility] Pager [phone]

## 2017-12-22 NOTE — Progress Notes (Signed)
  Echocardiogram 2D Echocardiogram has been performed.  [name] [name] 12/22/2017, 10:48 AM

## 2017-12-22 NOTE — Progress Notes (Signed)
Patient ID: [name] [name], male   DOB: 19-Aug-1980, 37 y.o.   MRN: [id number] [facility] Progress Note   Assessment/ Plan:   1.  Acute kidney injury on chronic kidney disease stage IV versus progression.    Based on the labs/available data, likely has suffered progression of his chronic kidney disease now to stage V.  I will check labs today to see if there is any improvement that could be related to recent use of NSAIDs.  He does not have any indications for dialysis at this time but will likely need it in the near future if his current GFR holds.  Will order vein mapping today and consult with vascular surgery prior to discharge for access planning.  Today, I initiated discussions with him regarding his chronic kidney disease stage V and the implications of this. 2.  Hypertension: Improving blood pressure control noted on amlodipine and carvedilol, appears euvolemic on exam. 3.  Anemia: Without overt loss, very low iron saturations-we will give intravenous Feraheme 4.  Pleuritic chest pain: On heparin bridge, ongoing anticoagulation with warfarin (transitioning from Xarelto in the setting of his advanced chronic kidney disease). 5.  Secondary hyperparathyroidism: We will check phosphorus and PTH level.  Subjective:   Reports to be feeling fair-intermittent chest pain persists.  Distraught by the use of advancing chronic kidney disease and proximity of dialysis.   Objective:   BP 118/77   Pulse 83   Temp 98.3 F (36.8 C) (Oral)   Resp 17   Ht 6\' 1"  (1.854 m)   Wt 123.2 kg (271 lb 9.7 oz)   SpO2 98%   BMI 35.83 kg/m   Intake/Output Summary (Last 24 hours) at 12/22/2017 6734 Last data filed at 12/22/2017 0600 Gross per 24 hour  Intake 1635.48 ml  Output 1200 ml  Net 435.48 ml   Weight change: -1.993 kg (-4 lb 6.3 oz)  Physical Exam: Gen: Appears to be comfortable resting in bed, watching TV CVS: Pulse regular rhythm, normal rate, S1 and S2 normal Resp: Clear to  auscultation, no rales/rhonchi Abd: Soft, obese, nontender Ext: No lower extremity edema  Imaging: Dg Chest 2 View  Result Date: 12/21/2017 CLINICAL DATA:  Chest pain and back pain since Monday. Shortness of breath. History of hypertension. Current smoker. EXAM: CHEST - 2 VIEW COMPARISON:  06/29/2017 FINDINGS: The heart size and mediastinal contours are within normal limits. Both lungs are clear. The visualized skeletal structures are unremarkable. IMPRESSION: No active cardiopulmonary disease. Electronically Signed   By: Lucienne Capers M.D.   On: 12/21/2017 00:59   US Renal  Result Date: 12/21/2017 CLINICAL DATA:  Chronic renal disease. Systemic lupus erythematosus. Antiphospholipid antibody syndrome EXAM: RENAL / URINARY TRACT ULTRASOUND COMPLETE COMPARISON:  None. FINDINGS: Right Kidney: Length: 11.7 cm. Echogenicity is increased. Renal cortical thickness is normal. No perinephric fluid or hydronephrosis visualized. There is a cyst arising from the lower pole right kidney measuring 2.8 x 2.5 x 2.2 cm. No sonographically demonstrable calculus or ureterectasis. Left Kidney: Length: 11.6 cm. Echogenicity is increased. Renal cortical thickness is normal. No perinephric fluid or hydronephrosis visualized. There is a cyst arising from the lower pole left kidney measuring 1.5 x 0.8 x 1.0 cm. A second cyst in this area measures 1.2 x 1.0 x 0.9 cm. No sonographically demonstrable calculus or ureterectasis. Bladder: Appears normal for degree of bladder distention. IMPRESSION: Increase in renal echogenicity bilaterally, a finding indicative of medical renal disease. Renal cortical thickness is normal bilaterally. No obstructing focus  in either kidney. There are cysts in each kidney. Electronically Signed   By: Lowella Grip III M.D.   On: 12/21/2017 11:03   Nm Pulmonary Perf And Vent  Result Date: 12/21/2017 CLINICAL DATA:  Chest pain. EXAM: NUCLEAR MEDICINE VENTILATION - PERFUSION LUNG SCAN VIEWS:  Anterior, posterior, left lateral, right lateral, RPO, LPO, RAO, LAO-ventilation and perfusion RADIOPHARMACEUTICALS:  31.0 mCi of Tc-11m DTPA aerosol inhalation and 4.2 mCi Tc23m-MAA IV COMPARISON:  Chest radiograph December 21, 2017 FINDINGS: Ventilation: Radiotracer uptake is homogeneous and symmetric bilaterally. No ventilation defects are evident. Perfusion: Radiotracer uptake is homogeneous and symmetric bilaterally. No perfusion defects are evident. IMPRESSION: No appreciable ventilation or perfusion defects. This study constitutes a very low probability of pulmonary embolus. Electronically Signed   By: Lowella Grip III M.D.   On: 12/21/2017 09:45    Labs: BMET Recent Labs  Lab 12/21/17 0045 12/21/17 1217  NA 137  --   K 4.4  --   CL 104  --   CO2 22  --   GLUCOSE 95  --   BUN 58*  --   CREATININE 9.66*  --   CALCIUM 8.4*  --   PHOS  --  5.2*   CBC Recent Labs  Lab 12/21/17 0045 12/22/17 0701  WBC 7.8 7.3  HGB 10.0* 9.3*  HCT 29.5* 28.2*  MCV 91.0 91.6  PLT 171 144*    Medications:    . amLODipine  10 mg Oral Daily  . carvedilol  3.125 mg Oral BID WC  . lamoTRIgine  100 mg Oral Q8H  . nicotine  21 mg Transdermal Daily  . pneumococcal 23 valent vaccine  0.5 mL Intramuscular Tomorrow-1000   Elmarie Shiley, MD 12/22/2017, 8:07 AM

## 2017-12-22 NOTE — Progress Notes (Signed)
[place] for heparin Indication: r/o DVT, hx cva/lupus AC  Allergies  Allergen Reactions  . Zyrtec [Cetirizine]     Facial swelling    Patient Measurements: Height: 6\' 1"  (185.4 cm) Weight: 276 lb (125.2 kg) IBW/kg (Calculated) : 79.9 Heparin Dosing Weight:  107.4kg  Vital Signs: Temp: 98.1 F (36.7 C) (07/06 2321) Temp Source: Oral (07/06 2321) BP: 132/81 (07/06 2321) Pulse Rate: 76 (07/06 2321)  Labs: Recent Labs    12/21/17 0045 12/21/17 1217 12/21/17 2142  HGB 10.0*  --   --   HCT 29.5*  --   --   PLT 171  --   --   APTT  --  >160* 114*  HEPARINUNFRC  --  2.04*  --   CREATININE 9.66*  --   --   TROPONINI  --  <0.03  --     Estimated Creatinine Clearance: 14.4 mL/min (A) (by C-G formula based on SCr of 9.66 mg/dL (H)).   Assessment: Patient on apixaban prior to admission for CVA/lupus +.  Per ED PA, patient seemed unsure of last dose time for apixaban but reported 7/5, noted history of medicine non-compliance.   Dopplers and VQ scan negative, but given dramatic elevated in SCr, cannot switch back to Eliquis at this time. Patient will likely need to be bridged to warfarin unless renal function improves significantly.  APTT elevated at 114 seconds after holding infusion and rate reduction earlier today. Using aPTT given recent dose of Eliquis. No bleeding noted.  Goal of Therapy:  Heparin level 0.3-0.7 units/ml aPTT 66-102 seconds Monitor platelets by anticoagulation protocol: Yes   Plan:  Decrease heparin infusion at 1600 units/hr Repeat aPTT in 6 hours Daily heparin level and aPTT until correlating; daily CBC    Ciani Rutten D. Ahlivia Salahuddin, PharmD, BCPS Clinical Pharmacist [phone] Please check AMION for all [place] numbers 12/22/2017 12:44 AM

## 2017-12-22 NOTE — Progress Notes (Signed)
[place] for heparin Indication: r/o DVT, hx cva/lupus AC  Allergies  Allergen Reactions  . Zyrtec [Cetirizine]     Facial swelling    Patient Measurements: Height: 6\' 1"  (185.4 cm) Weight: 271 lb 9.7 oz (123.2 kg) IBW/kg (Calculated) : 79.9 Heparin Dosing Weight:  107.4kg  Vital Signs: Temp: 98 F (36.7 C) (07/07 1550) Temp Source: Oral (07/07 1550) BP: 149/96 (07/07 1550) Pulse Rate: 73 (07/07 1550)  Labs: Recent Labs    12/21/17 0045  12/21/17 1217 12/21/17 2142 12/22/17 0701 12/22/17 0702 12/22/17 1840  HGB 10.0*  --   --   --  9.3*  --   --   HCT 29.5*  --   --   --  28.2*  --   --   PLT 171  --   --   --  144*  --   --   APTT  --    < > >160* 114* 141*  --  87*  LABPROT  --   --   --   --  14.6  --   --   INR  --   --   --   --  1.14  --   --   HEPARINUNFRC  --   --  2.04*  --   --  0.47  --   CREATININE 9.66*  --   --   --  9.52*  9.21*  --   --   TROPONINI  --   --  <0.03  --   --   --   --    < > = values in this interval not displayed.    Estimated Creatinine Clearance: 15 mL/min (A) (by C-G formula based on SCr of 9.21 mg/dL (H)).   Assessment: Patient on apixaban prior to admission for CVA/lupus +.  Per ED PA, patient seemed unsure of last dose time for apixaban but reported 7/5, noted history of medicine non-compliance.   -aPTT at goal after heparin hold and decrease to 1300 units/hr  Goal of Therapy:  Heparin level 0.3-0.7 units/ml aPTT 66-102 seconds Monitor platelets by anticoagulation protocol: Yes   Plan:  -No heparin changes needed -Heparin level and CBC in am  Hildred Laser, PharmD Clinical Pharmacist Please check Amion for pharmacy contact number  12/22/2017 7:20 PM

## 2017-12-22 NOTE — Progress Notes (Signed)
[place] for heparin Indication: r/o DVT, hx cva/lupus AC  Allergies  Allergen Reactions  . Zyrtec [Cetirizine]     Facial swelling    Patient Measurements: Height: 6\' 1"  (185.4 cm) Weight: 271 lb 9.7 oz (123.2 kg) IBW/kg (Calculated) : 79.9 Heparin Dosing Weight:  107.4kg  Vital Signs: Temp: 98.2 F (36.8 C) (07/07 0800) Temp Source: Oral (07/07 0800) BP: 118/77 (07/07 0800) Pulse Rate: 83 (07/07 0800)  Labs: Recent Labs    12/21/17 0045 12/21/17 1217 12/21/17 2142 12/22/17 0701 12/22/17 0702  HGB 10.0*  --   --  9.3*  --   HCT 29.5*  --   --  28.2*  --   PLT 171  --   --  144*  --   APTT  --  >160* 114* 141*  --   LABPROT  --   --   --  14.6  --   INR  --   --   --  1.14  --   HEPARINUNFRC  --  2.04*  --   --  0.47  CREATININE 9.66*  --   --  9.52*  9.21*  --   TROPONINI  --  <0.03  --   --   --     Estimated Creatinine Clearance: 15 mL/min (A) (by C-G formula based on SCr of 9.21 mg/dL (H)).   Assessment: Patient on apixaban prior to admission for CVA/lupus +.  Per ED PA, patient seemed unsure of last dose time for apixaban but reported 7/5, noted history of medicine non-compliance.   Dopplers and VQ scan negative, but given dramatic elevated in SCr, cannot switch back to Eliquis at this time. Patient will likely need to be bridged to warfarin unless renal function improves significantly.  APTT still elevated at 141 seconds on recheck this morning Using aPTT given recent dose of Eliquis. Have not seen correlation between heparin levels and aptt yet but will likely be able to just use heparin levels in am. No bleeding noted. CBC stable.   Goal of Therapy:  Heparin level 0.3-0.7 units/ml aPTT 66-102 seconds Monitor platelets by anticoagulation protocol: Yes   Plan:  Hold heparin x 30 minutes Decrease heparin infusion at 1300 units/hr Repeat aPTT in 8 hours Daily heparin level and aPTT until correlating; daily  CBC  Erin Hearing PharmD., BCPS Clinical Pharmacist 12/22/2017 10:34 AM

## 2017-12-23 ENCOUNTER — Inpatient Hospital Stay (HOSPITAL_COMMUNITY): Payer: BLUE CROSS/BLUE SHIELD

## 2017-12-23 DIAGNOSIS — Z01810 Encounter for preprocedural cardiovascular examination: Secondary | ICD-10-CM

## 2017-12-23 DIAGNOSIS — N185 Chronic kidney disease, stage 5: Secondary | ICD-10-CM

## 2017-12-23 LAB — RENAL FUNCTION PANEL
ALBUMIN: 2.7 g/dL — AB (ref 3.5–5.0)
Anion gap: 9 (ref 5–15)
BUN: 61 mg/dL — AB (ref 6–20)
CALCIUM: 8.6 mg/dL — AB (ref 8.9–10.3)
CO2: 24 mmol/L (ref 22–32)
Chloride: 105 mmol/L (ref 98–111)
Creatinine, Ser: 9.56 mg/dL — ABNORMAL HIGH (ref 0.61–1.24)
GFR calc Af Amer: 7 mL/min — ABNORMAL LOW (ref >60)
GFR calc non Af Amer: 6 mL/min — ABNORMAL LOW (ref >60)
Glucose, Bld: 106 mg/dL — ABNORMAL HIGH (ref 70–99)
PHOSPHORUS: 5.7 mg/dL — AB (ref 2.5–4.6)
POTASSIUM: 4.5 mmol/L (ref 3.5–5.1)
SODIUM: 138 mmol/L (ref 135–145)

## 2017-12-23 LAB — CBC
HCT: 28.4 % — ABNORMAL LOW (ref 39.0–52.0)
Hemoglobin: 9.4 g/dL — ABNORMAL LOW (ref 13.0–17.0)
MCH: 30.1 pg (ref 26.0–34.0)
MCHC: 33.1 g/dL (ref 30.0–36.0)
MCV: 91 fL (ref 78.0–100.0)
Platelets: 157 10*3/uL (ref 150–400)
RBC: 3.12 MIL/uL — AB (ref 4.22–5.81)
RDW: 12.4 % (ref 11.5–15.5)
WBC: 6.6 10*3/uL (ref 4.0–10.5)

## 2017-12-23 LAB — HEPARIN LEVEL (UNFRACTIONATED): Heparin Unfractionated: 0.11 IU/mL — ABNORMAL LOW (ref 0.30–0.70)

## 2017-12-23 LAB — APTT: APTT: 99 s — AB (ref 24–36)

## 2017-12-23 MED ORDER — CALCITRIOL 0.25 MCG PO CAPS
0.2500 ug | ORAL_CAPSULE | Freq: Every day | ORAL | Status: DC
  Administered 2017-12-23 – 2017-12-26 (×4): 0.25 ug via ORAL
  Filled 2017-12-23 (×4): qty 1

## 2017-12-23 MED ORDER — DARBEPOETIN ALFA 100 MCG/0.5ML IJ SOSY
100.0000 ug | PREFILLED_SYRINGE | INTRAMUSCULAR | Status: DC
  Administered 2017-12-23: 100 ug via SUBCUTANEOUS
  Filled 2017-12-23 (×2): qty 0.5

## 2017-12-23 MED ORDER — AMLODIPINE BESYLATE 10 MG PO TABS
10.0000 mg | ORAL_TABLET | Freq: Every day | ORAL | Status: DC
  Administered 2017-12-23 – 2017-12-25 (×3): 10 mg via ORAL
  Filled 2017-12-23 (×3): qty 1

## 2017-12-23 MED ORDER — KIDNEY FAILURE BOOK
Freq: Once | Status: AC
  Administered 2017-12-23: 14:00:00
  Filled 2017-12-23: qty 1

## 2017-12-23 MED ORDER — SODIUM CHLORIDE 0.9 % IV SOLN
1.5000 g | INTRAVENOUS | Status: AC
  Filled 2017-12-23: qty 1.5

## 2017-12-23 NOTE — Progress Notes (Signed)
PROGRESS NOTE    [name] [name]  OFB:[id number] DOB: 1981/01/06 DOA: 12/21/2017 PCP: Alfonse Spruce, FNP    Brief Narrative:  37 year old male who presented with chest pain. He does have the significant past medical history of antiphospholipid antibody syndrome, history of CVA, chronic kidney disease stageIV,and hypertension. Patient reported retrosternal chest pain, pleuritic in nature, for 4 to 5 days prior to hospitalization, pressure in nature with no radiation, and no improving factors. Associated with right ankle edema.He has been not compliant with anticoagulation at home. Not physically active. On his initial physical examination blood pressure 148/95, heart rate 79, respiratory rate26, oxygen saturation 95%.Moist mucous membranes, lungs clear to auscultation bilaterally, heart S1-S2 present rhythmic, abdomen soft nontender, no lower extremity edema.Sodium 137, potassium 4.4, chloride 104, bicarb 22, glucose 95, BUN 58, creatinine 9.6, white count 7.8, hemoglobin 10.0, hematocrit 39.5, platelets 171, point-of-care troponin 0.03.Urine sodium 90,specific gravity 1.009, 100 protein, RBC 0-5, white cells 0-5, protein creatinine ratio 2.9.Chest x-ray with prominent hilar regions. Sinus rhythm, right axis deviation, right bundle Delia block, no ST or T wave changes.  Patient was admitted to the hospital with working diagnosis of atypical chest pain, in the setting of progressive chronic kidney disease, acute kidney injury on chronic kidney disease stage 5.  Assessment & Plan:   Active Problems:   Lupus anticoagulant positive   HTN (hypertension)   Seizures (HCC)   ARF (acute renal failure) ([place])  1. Atypical chest pain.  Venous thromboembolism has been ruled out.   2. Antiphospholipid antibody. Anticoagulation with heparin, plan to start warfarin after vascular procedure. Target INR 2 to 3.   2. 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 Mild arteriosclerosis with mild interstitial fibrosis and tubular atrophy. Stable renal function with serum cr at 9,56 with K at 4,5 and serum bicarbonate at 24. Vascular surgery consulted for HD vascular access. Will follow renal panel in am. Continue phoslo with meals. No signs of volume overload, no indications for urgent renal replacement therapy today. Follow with nephrology recommendations.   3. HTN. Amlodipine and carvedilol, systolic blood pressure 782 to 423 mmHg systolic.  4. Combined anemia of iron deficiency and anemia of chronic renal disease (sp IV iron). Target hb 11. Follow cell count and iron panel as outpatient.    5. Seizures. Continue lamotrigine for seizure control.   6. Tobacco abuse. Continue Smoking cessation, on nicotine patch.   DVT prophylaxis:heparin Code Status:full Family Communication:no family at the bedside Disposition Plan/ discharge barriers:patient will need therapeutic INR before discharge.  Consultants:  Nephrology  Procedures:    Antimicrobials:     Subjective: Patient is feeling better, no nausea or vomiting, no further chest pain.   Objective: Vitals:   12/22/17 2037 12/22/17 2300 12/23/17 0420 12/23/17 0759  BP: (!) 134/91 (!) 145/96 117/81 129/82  Pulse: 76 70 82   Resp: 18 19 20 16   Temp: 97.8 F (36.6 C) 98.6 F (37 C) 97.8 F (36.6 C) 99 F (37.2 C)  TempSrc: Oral Oral Oral Oral  SpO2: 99% 100% 100%   Weight:   123.2 kg (271 lb 8 oz)   Height:        Intake/Output Summary (Last 24 hours) at 12/23/2017 0814 Last data filed at 12/23/2017 0000 Gross per 24 hour  Intake 427.82 ml  Output -  Net 427.82 ml   Filed Weights   12/21/17 0700 12/22/17 0557 12/23/17 0420  Weight: 124.4 kg (274 lb 3.2 oz) 123.2 kg (271 lb 9.7 oz) 123.2 kg (271 lb 8 oz)    Examination:   General: Not in pain or dyspnea  Neurology: Awake and alert, non focal   E ENT: no pallor, no icterus, oral mucosa moist Cardiovascular: No JVD. S1-S2 present, rhythmic, no gallops, rubs, or murmurs. Trace lower extremity edema. Pulmonary: vesicular breath sounds bilaterally, adequate air movement, no wheezing, rhonchi or rales. Gastrointestinal. Abdomen with no organomegaly, non tender, no rebound or guarding Skin. No rashes Musculoskeletal: no joint deformities     Data Reviewed: I have personally reviewed following labs and imaging studies  CBC: Recent Labs  Lab 12/21/17 0045 12/22/17 0701 12/23/17 0237  WBC 7.8 7.3 6.6  HGB 10.0* 9.3* 9.4*  HCT 29.5* 28.2* 28.4*  MCV 91.0 91.6 91.0  PLT 171 144* 161   Basic Metabolic Panel: Recent Labs  Lab 12/21/17 0045 12/21/17 1217 12/22/17 0701 12/23/17 0237  NA 137  --  136  137 138  K 4.4  --  4.3  4.2 4.5  CL 104  --  106  104 105  CO2 22  --  23  23 24   GLUCOSE 95  --  85  88 106*  BUN 58*  --  60*  60* 61*  CREATININE 9.66*  --  9.52*  9.21* 9.56*  CALCIUM 8.4*  --  8.3*  8.5* 8.6*  PHOS  --  5.2* 5.8* 5.7*   GFR: Estimated Creatinine Clearance: 14.4 mL/min (A) (by C-G formula based on SCr of 9.56 mg/dL (H)). Liver Function Tests: Recent Labs  Lab 12/22/17 0701 12/23/17 0237  ALBUMIN 2.8* 2.7*   No results for input(s): LIPASE, AMYLASE in the last 168 hours. No results for input(s): AMMONIA in the last 168 hours. Coagulation Profile: Recent Labs  Lab 12/22/17 0701  INR 1.14   Cardiac Enzymes: Recent Labs  Lab 12/21/17 1217  TROPONINI <0.03   BNP (last 3 results) No results for input(s): PROBNP in the last 8760 hours. HbA1C: No results for input(s): HGBA1C in the last 72 hours. CBG: No results for input(s): GLUCAP in the last 168 hours. Lipid Profile: No results for input(s): CHOL, HDL, LDLCALC, TRIG, CHOLHDL, LDLDIRECT in the last 72 hours. Thyroid Function Tests: No results for input(s): TSH, T4TOTAL, FREET4, T3FREE, THYROIDAB in the last 72 hours. Anemia  Panel: Recent Labs    12/21/17 1217  FERRITIN 165  TIBC 228*  IRON 20*      Radiology Studies: I have reviewed all of the imaging during this hospital visit personally     Scheduled Meds: . amLODipine  10 mg Oral QHS  . calcitRIOL  0.25 mcg Oral Daily  . calcium acetate  667 mg Oral TID WC  . carvedilol  3.125 mg Oral BID WC  . darbepoetin (ARANESP) injection - NON-DIALYSIS  100 mcg Subcutaneous Q Mon-1800  . lamoTRIgine  100 mg Oral Q8H  . nicotine  21 mg Transdermal Daily   Continuous Infusions: . ferumoxytol Stopped (12/22/17 0921)  . heparin 1,300 Units/hr (12/23/17 0606)     LOS: 2 days        Mauricio Gerome Apley, MD [facility] Pager [phone]

## 2017-12-23 NOTE — Progress Notes (Signed)
Subjective: Interval History: has no complaint, but many questions about kidney status.  Objective: Vital signs in last 24 hours: Temp:  [97.8 F (36.6 C)-98.6 F (37 C)] 97.8 F (36.6 C) (07/08 0420) Pulse Rate:  [69-83] 82 (07/08 0420) Resp:  [12-20] 20 (07/08 0420) BP: (115-149)/(68-96) 117/81 (07/08 0420) SpO2:  [98 %-100 %] 100 % (07/08 0420) Weight:  [123.2 kg (271 lb 8 oz)] 123.2 kg (271 lb 8 oz) (07/08 0420) Weight change: -0.048 kg (-1.7 oz)  Intake/Output from previous day: 07/07 0701 - 07/08 0700 In: 459.9 [P.O.:120; I.V.:222.9; IV Piggyback:117] Out: -  Intake/Output this shift: No intake/output data recorded.  General appearance: alert, cooperative and no distress Resp: clear to auscultation bilaterally Cardio: S1, S2 normal, S4 present and systolic murmur: systolic ejection 2/6, decrescendo at 2nd left intercostal space GI: soft, liver down 5 cm, nontender Extremities: extremities normal, atraumatic, no cyanosis or edema  Lab Results: Recent Labs    12/22/17 0701 12/23/17 0237  WBC 7.3 6.6  HGB 9.3* 9.4*  HCT 28.2* 28.4*  PLT 144* 157   BMET:  Recent Labs    12/22/17 0701 12/23/17 0237  NA 136  137 138  K 4.3  4.2 4.5  CL 106  104 105  CO2 23  23 24   GLUCOSE 85  88 106*  BUN 60*  60* 61*  CREATININE 9.52*  9.21* 9.56*  CALCIUM 8.3*  8.5* 8.6*   Recent Labs    12/21/17 1217  PTH 199*   Iron Studies:  Recent Labs    12/21/17 1217  IRON 20*  TIBC 228*  FERRITIN 165    Studies/Results: US Renal  Result Date: 12/21/2017 CLINICAL DATA:  Chronic renal disease. Systemic lupus erythematosus. Antiphospholipid antibody syndrome EXAM: RENAL / URINARY TRACT ULTRASOUND COMPLETE COMPARISON:  None. FINDINGS: Right Kidney: Length: 11.7 cm. Echogenicity is increased. Renal cortical thickness is normal. No perinephric fluid or hydronephrosis visualized. There is a cyst arising from the lower pole right kidney measuring 2.8 x 2.5 x 2.2 cm. No  sonographically demonstrable calculus or ureterectasis. Left Kidney: Length: 11.6 cm. Echogenicity is increased. Renal cortical thickness is normal. No perinephric fluid or hydronephrosis visualized. There is a cyst arising from the lower pole left kidney measuring 1.5 x 0.8 x 1.0 cm. A second cyst in this area measures 1.2 x 1.0 x 0.9 cm. No sonographically demonstrable calculus or ureterectasis. Bladder: Appears normal for degree of bladder distention. IMPRESSION: Increase in renal echogenicity bilaterally, a finding indicative of medical renal disease. Renal cortical thickness is normal bilaterally. No obstructing focus in either kidney. There are cysts in each kidney. Electronically Signed   By: Lowella Grip III M.D.   On: 12/21/2017 11:03   Nm Pulmonary Perf And Vent  Result Date: 12/21/2017 CLINICAL DATA:  Chest pain. EXAM: NUCLEAR MEDICINE VENTILATION - PERFUSION LUNG SCAN VIEWS: Anterior, posterior, left lateral, right lateral, RPO, LPO, RAO, LAO-ventilation and perfusion RADIOPHARMACEUTICALS:  31.0 mCi of Tc-54m DTPA aerosol inhalation and 4.2 mCi Tc26m-MAA IV COMPARISON:  Chest radiograph December 21, 2017 FINDINGS: Ventilation: Radiotracer uptake is homogeneous and symmetric bilaterally. No ventilation defects are evident. Perfusion: Radiotracer uptake is homogeneous and symmetric bilaterally. No perfusion defects are evident. IMPRESSION: No appreciable ventilation or perfusion defects. This study constitutes a very low probability of pulmonary embolus. Electronically Signed   By: Lowella Grip III M.D.   On: 12/21/2017 09:45    I have reviewed the patient's current medications.  Assessment/Plan: 1 CKD 5 needs perm access.  No apparent reversibility.  Education started  2 Anemia give Fe, will need esa 3 HPTH vit D 4 HTN controlled 5 SLE 6 APLA  On anticoag 7 CVA 8 Sz disorder meds P educate, map, perm access ASAP. bp meds, anticoa   LOS: 2 days   Jeneen Rinks Laterica Matarazzo 12/23/2017,7:53  AM

## 2017-12-23 NOTE — Progress Notes (Signed)
[place] for heparin Indication: r/o DVT, hx cva/lupus AC  Allergies  Allergen Reactions  . Zyrtec [Cetirizine]     Facial swelling    Patient Measurements: Height: 6\' 1"  (185.4 cm) Weight: 271 lb 8 oz (123.2 kg) IBW/kg (Calculated) : 79.9 Heparin Dosing Weight:  107.4kg  Vital Signs: Temp: 99 F (37.2 C) (07/08 0759) Temp Source: Oral (07/08 0759) BP: 129/82 (07/08 0759) Pulse Rate: 82 (07/08 0420)  Labs: Recent Labs    12/21/17 0045 12/21/17 1217  12/22/17 0701 12/22/17 0702 12/22/17 1840 12/23/17 0237  HGB 10.0*  --   --  9.3*  --   --  9.4*  HCT 29.5*  --   --  28.2*  --   --  28.4*  PLT 171  --   --  144*  --   --  157  APTT  --  >160*   < > 141*  --  87* 99*  LABPROT  --   --   --  14.6  --   --   --   INR  --   --   --  1.14  --   --   --   HEPARINUNFRC  --  2.04*  --   --  0.47  --  0.11*  CREATININE 9.66*  --   --  9.52*  9.21*  --   --  9.56*  TROPONINI  --  <0.03  --   --   --   --   --    < > = values in this interval not displayed.    Estimated Creatinine Clearance: 14.4 mL/min (A) (by C-G formula based on SCr of 9.56 mg/dL (H)).   Assessment: Patient on apixaban prior to admission for CVA/lupus +.  Per ED PA, patient seemed unsure of last dose time for apixaban but reported 7/5, noted history of medicine non-compliance.    Heparin level reported as low at 0.11 today?, aPTT therapeutic at 99, on 1300 units/hr. Hgb 9.4, plt 157. No s/sx of bleeding.   Goal of Therapy:  Heparin level 0.3-0.7 units/ml aPTT 66-102 seconds Monitor platelets by anticoagulation protocol: Yes   Plan:  -No heparin changes needed -Heparin level and CBC in am  Doylene Canard, PharmD Clinical Pharmacist  Pager: [phone] Phone: [phone] Please check Amion for pharmacy contact number 12/23/2017 8:15 AM

## 2017-12-23 NOTE — Progress Notes (Signed)
VASCULAR LAB PRELIMINARY  PRELIMINARY  PRELIMINARY  PRELIMINARY       Katielynn Horan, RVT 12/23/2017, 5:19 PM

## 2017-12-23 NOTE — Consult Note (Addendum)
[name] NOTE   MRN : [id number]  Reason for Consult: CKD stage V Referring Physician: Dr. Jimmy Footman  History of Present Illness: 37 y/o male who has CKD stage V.  We have been asked to place permanent access.  His past medical history includes Stroke 2003 on Xarelto anticoagulation.  This being held and he is on Heparin currently.  HTN, hyperlipidemia,  and Lupus.    He is right hand dominant.      Current Facility-Administered Medications  Medication Dose Route Frequency Provider Last Rate Last Dose  . acetaminophen (TYLENOL) tablet 650 mg  650 mg Oral Q6H PRN Rise Patience, MD       Or  . acetaminophen (TYLENOL) suppository 650 mg  650 mg Rectal Q6H PRN Rise Patience, MD      . albuterol (PROVENTIL) (2.5 MG/3ML) 0.083% nebulizer solution 2.5 mg  2.5 mg Nebulization Q4H PRN Rise Patience, MD      . amLODipine (NORVASC) tablet 10 mg  10 mg Oral QHS Deterding, Jeneen Rinks, MD      . calcitRIOL (ROCALTROL) capsule 0.25 mcg  0.25 mcg Oral Daily Deterding, James, MD      . calcium acetate (PHOSLO) capsule 667 mg  667 mg Oral TID WC Tawni Millers, MD   667 mg at 12/23/17 3500  . carvedilol (COREG) tablet 3.125 mg  3.125 mg Oral BID WC Elmarie Shiley, MD   3.125 mg at 12/23/17 0842  . Darbepoetin Alfa (ARANESP) injection 100 mcg  100 mcg Subcutaneous Q Mon-1800 Deterding, James, MD      . ferumoxytol [facility]) 510 mg in sodium chloride 0.9 % 100 mL IVPB  510 mg Intravenous Q72H Elmarie Shiley, MD   Stopped at 12/22/17 [phone]  . heparin ADULT infusion 100 units/mL (25000 units/254mL sodium chloride 0.45%)  1,300 Units/hr Intravenous Continuous Lyndee Leo, RPH 13 mL/hr at 12/23/17 0800 1,300 Units/hr at 12/23/17 0800  . hydrALAZINE (APRESOLINE) injection 10 mg  10 mg Intravenous Q4H PRN Rise Patience, MD      . lamoTRIgine (LAMICTAL) tablet 100 mg  100 mg Oral Q8H Rise Patience, MD   100 mg at 12/23/17 8299  . nicotine  (NICODERM CQ - dosed in mg/24 hours) patch 21 mg  21 mg Transdermal Daily Arrien, Jimmy Picket, MD   21 mg at 12/23/17 [phone]  . ondansetron (ZOFRAN) tablet 4 mg  4 mg Oral Q6H PRN Rise Patience, MD       Or  . ondansetron [facility]) injection 4 mg  4 mg Intravenous Q6H PRN Rise Patience, MD        Pt meds include: Statin :Yes Betablocker: No ASA: No Other anticoagulants/antiplatelets: Xarelto at home, now on Heparin.    Past Medical History:  Diagnosis Date  . Hypertension   . Lupus ([place])   . Seizures ([place])   . Seizures ([place])   . Stroke ([place])   . Stroke [facility])    20 or 37 years of age.      Past Surgical History:  Procedure Laterality Date  . RENAL BIOPSY      Social History Social History   Tobacco Use  . Smoking status: Current Every Day Smoker    Packs/day: 0.25  . Smokeless tobacco: Never Used  Substance Use Topics  . Alcohol use: Yes  . Drug use: No    Family History Family History  Problem Relation Age of Onset  . Diabetes Mother   .  Diabetes Other     Allergies  Allergen Reactions  . Zyrtec [Cetirizine]     Facial swelling     REVIEW OF SYSTEMS  General: [ ]  Weight loss, [ ]  Fever, [ ]  chills Neurologic: [ ]  Dizziness, [ ]  Blackouts, [ ]  Seizure [x ] Stroke, [ ]  "Mini stroke", [ ]  Slurred speech, [ ]  Temporary blindness; [ ]  weakness in arms or legs, [ ]  Hoarseness [ ]  Dysphagia Cardiac: [x ] Chest pain/pressure, [ ]  Shortness of breath at rest [x ] Shortness of breath with exertion, [ ]  Atrial fibrillation or irregular heartbeat  Vascular: [ ]  Pain in legs with walking, [ ]  Pain in legs at rest, [ ]  Pain in legs at night,  [ ]  Non-healing ulcer, [ ]  Blood clot in vein/DVT,   Pulmonary: [ ]  Home oxygen, [ ]  Productive cough, [ ]  Coughing up blood, [ ]  Asthma,  [ ]  Wheezing [ ]  COPD Musculoskeletal:  [ ]  Arthritis, [ ]  Low back pain, [ ]  Joint pain Hematologic: [ ]  Easy Bruising, [ ]  Anemia; [ ]  Hepatitis Gastrointestinal: [ ]  Blood  in stool, [ ]  Gastroesophageal Reflux/heartburn, Urinary: [x ] chronic Kidney disease, [ ]  on HD - [ ]  MWF or [ ]  TTHS, [ ]  Burning with urination, [ ]  Difficulty urinating Skin: [ ]  Rashes, [ ]  Wounds Psychological: [ ]  Anxiety, [ ]  Depression  Physical Examination Vitals:   12/22/17 2037 12/22/17 2300 12/23/17 0420 12/23/17 0759  BP: (!) 134/91 (!) 145/96 117/81 129/82  Pulse: 76 70 82 73  Resp: 18 19 20 16   Temp: 97.8 F (36.6 C) 98.6 F (37 C) 97.8 F (36.6 C) 99 F (37.2 C)  TempSrc: Oral Oral Oral Oral  SpO2: 99% 100% 100%   Weight:   271 lb 8 oz (123.2 kg)   Height:       Body mass index is 35.82 kg/m.  General:  WDWN in NAD HENT: WNL, normocephalic Eyes: Pupils equal Pulmonary: normal non-labored breathing , without Rales, rhonchi,  wheezing Cardiac: RRR, without  Murmurs, rubs or gallops; No carotid bruits Abdomen: soft, NT, no masses Skin: no rashes, ulcers noted;  no Gangrene , no cellulitis; no open wounds;   Vascular Exam/Pulses:Palpable radial pulses B   Musculoskeletal: no muscle wasting or atrophy; no obvious edema  Neurologic: A&O X 3; Appropriate Affect ;  SENSATION: normal; MOTOR FUNCTION: 5/5 Symmetric Speech is fluent/normal   Significant Diagnostic Studies: CBC Lab Results  Component Value Date   WBC 6.6 12/23/2017   HGB 9.4 (L) 12/23/2017   HCT 28.4 (L) 12/23/2017   MCV 91.0 12/23/2017   PLT 157 12/23/2017    BMET    Component Value Date/Time   NA 138 12/23/2017 0237   NA 143 12/12/2017 1609   K 4.5 12/23/2017 0237   CL 105 12/23/2017 0237   CO2 24 12/23/2017 0237   GLUCOSE 106 (H) 12/23/2017 0237   BUN 61 (H) 12/23/2017 0237   BUN 36 (H) 12/12/2017 1609   CREATININE 9.56 (H) 12/23/2017 0237   CREATININE 2.06 (H) 10/24/2015 1603   CALCIUM 8.6 (L) 12/23/2017 0237   GFRNONAA 6 (L) 12/23/2017 0237   GFRNONAA 40 (L) 10/24/2015 1603   GFRAA 7 (L) 12/23/2017 0237   GFRAA 47 (L) 10/24/2015 1603   Estimated Creatinine Clearance:  14.4 mL/min (A) (by C-G formula based on SCr of 9.56 mg/dL (H)).  COAG Lab Results  Component Value Date   INR 1.14 12/22/2017  INR 1.0 12/10/2016   INR 1.08 10/20/2016   PROTIME 28.8 (H) 11/04/2013   PROTIME 21.6 (H) 10/14/2013   PROTIME 18.0 (H) 09/23/2013     Non-Invasive Vascular Imaging: Pending  ASSESSMENT/PLAN:  CKD stage V Vein mapping has been ordered.  He is right hand dominant.  Plan for left UE AV fistula verse graft with Dr. Trula Slade 12/24/2017.  I have called our office scheduler. Heparin stopped on call to OR, NPO past MN.   Roxy Horseman 12/23/2017 9:57 AM   Discussed options for hemodialysis.  Catheter is not felt to be needed at this time.  Patient has extremely poor understanding of his renal failure and access options.  I again explained options for catheter which he adamantly refuses.  Also discussed AV fistula and AV graft.  Vein map is pending but he does appear to have a very large antecubital vein.  Plan for surgery tomorrow

## 2017-12-23 NOTE — Plan of Care (Signed)
Nutrition Education Note  RD consulted for Renal Education.   Spoke with pt at bedside, who was reading kidney disease pamphlet at time of visit. Pt appeared very overwhelmed by information and situation, but asked good questions and eager to learn. He reports he usually consumes only one meal per day, due to his work schedule and consumes mainly fast food (chinese food, [employer], biscuits and gravy, steak and cheese sandwiches). He does not cook often, but has been considering meal prep to transition to a more healthy lifestyle. Focus of education was on healthier food and beverage choices at fast food restaurants as well as easy meal prep ideas.   Provided "[place] for People With Kidney Disease" handout to patient/family. Reviewed food groups and provided written recommended serving sizes specifically determined for patient's current nutritional status.   Explained why diet restrictions are needed and provided lists of foods to limit/avoid that are high potassium, sodium, and phosphorus. Provided specific recommendations on safer alternatives of these foods. Strongly encouraged compliance of this diet.   Discussed importance of protein intake at each meal and snack. Provided examples of how to maximize protein intake throughout the day. Discussed need for fluid restriction with dialysis, importance of minimizing weight gain between HD treatments, and renal-friendly beverage options.  Encouraged pt to discuss specific diet questions/concerns with RD at HD outpatient facility. Teach back method used.  Expect fair to poor compliance.  Body mass index is 35.82 kg/m. Pt meets criteria for obesity, class II based on current BMI.  Current diet order is renal with 1200 ml fluid restriction, patient is consuming approximately 100% of meals at this time. Labs and medications reviewed. No further nutrition interventions warranted at this time. RD contact information provided. If  additional nutrition issues arise, please re-consult RD.  Monte Bronder A. Jimmye Norman, RD, LDN, CDE Pager: [phone] After hours Pager: [phone]

## 2017-12-24 LAB — PTH, INTACT AND CALCIUM
CALCIUM TOTAL (PTH): 8.4 mg/dL — AB (ref 8.7–10.2)
PTH: 287 pg/mL — ABNORMAL HIGH (ref 15–65)

## 2017-12-24 LAB — CBC
HCT: 28.4 % — ABNORMAL LOW (ref 39.0–52.0)
HEMOGLOBIN: 9.4 g/dL — AB (ref 13.0–17.0)
MCH: 30.2 pg (ref 26.0–34.0)
MCHC: 33.1 g/dL (ref 30.0–36.0)
MCV: 91.3 fL (ref 78.0–100.0)
Platelets: 147 10*3/uL — ABNORMAL LOW (ref 150–400)
RBC: 3.11 MIL/uL — ABNORMAL LOW (ref 4.22–5.81)
RDW: 12.2 % (ref 11.5–15.5)
WBC: 7.3 10*3/uL (ref 4.0–10.5)

## 2017-12-24 LAB — RENAL FUNCTION PANEL
Albumin: 2.8 g/dL — ABNORMAL LOW (ref 3.5–5.0)
Anion gap: 12 (ref 5–15)
BUN: 65 mg/dL — AB (ref 6–20)
CALCIUM: 8.7 mg/dL — AB (ref 8.9–10.3)
CHLORIDE: 102 mmol/L (ref 98–111)
CO2: 21 mmol/L — AB (ref 22–32)
CREATININE: 9.43 mg/dL — AB (ref 0.61–1.24)
GFR calc non Af Amer: 6 mL/min — ABNORMAL LOW (ref >60)
GFR, EST AFRICAN AMERICAN: 7 mL/min — AB (ref >60)
GLUCOSE: 92 mg/dL (ref 70–99)
Phosphorus: 5.7 mg/dL — ABNORMAL HIGH (ref 2.5–4.6)
Potassium: 4.4 mmol/L (ref 3.5–5.1)
SODIUM: 135 mmol/L (ref 135–145)

## 2017-12-24 LAB — BASIC METABOLIC PANEL
ANION GAP: 13 (ref 5–15)
BUN: 64 mg/dL — ABNORMAL HIGH (ref 6–20)
CHLORIDE: 100 mmol/L (ref 98–111)
CO2: 22 mmol/L (ref 22–32)
Calcium: 8.7 mg/dL — ABNORMAL LOW (ref 8.9–10.3)
Creatinine, Ser: 9.48 mg/dL — ABNORMAL HIGH (ref 0.61–1.24)
GFR calc Af Amer: 7 mL/min — ABNORMAL LOW (ref >60)
GFR calc non Af Amer: 6 mL/min — ABNORMAL LOW (ref >60)
GLUCOSE: 95 mg/dL (ref 70–99)
POTASSIUM: 4.3 mmol/L (ref 3.5–5.1)
Sodium: 135 mmol/L (ref 135–145)

## 2017-12-24 LAB — APTT: APTT: 82 s — AB (ref 24–36)

## 2017-12-24 LAB — PROTIME-INR
INR: 1.03
Prothrombin Time: 13.4 seconds (ref 11.4–15.2)

## 2017-12-24 LAB — HEPARIN LEVEL (UNFRACTIONATED): Heparin Unfractionated: 0.1 IU/mL — ABNORMAL LOW (ref 0.30–0.70)

## 2017-12-24 NOTE — Progress Notes (Signed)
PROGRESS NOTE    [name]  NWG:[id number] DOB: 08-08-1980 DOA: 12/21/2017 PCP: Alfonse Spruce, FNP    Brief Narrative:  37 year old male who presented with chest pain. He does have the significant past medical history of antiphospholipid antibody syndrome, history of CVA, chronic kidney disease stageIV,and hypertension. Patient reported retrosternal chest pain, pleuritic in nature, for 4 to 5 days prior to hospitalization, pressure in nature with no radiation, no improving factors and associated with right ankle edema.He has been not compliant with anticoagulation at home. Not physically active. On his initial physical examination blood pressure 148/95, heart rate 79, respiratory rate26, oxygen saturation 95%.Moist mucous membranes, lungs clear to auscultation bilaterally, heart S1-S2 present rhythmic, abdomen soft nontender, trace lower extremity edema.Sodium 137, potassium 4.4, chloride 104, bicarb 22, glucose 95, BUN 58, creatinine 9.6, white count 7.8, hemoglobin 10.0, hematocrit 39.5, platelets 171, point-of-care troponin 0.03.Urine sodium 90,specific gravity 1.009, 100 protein, RBC 0-5, white cells 0-5, protein creatinine ratio 2.9.Chest x-ray with prominent hilar regions. Sinus rhythm, right axis deviation, right bundle Shafran block, no ST or T wave changes.  Patient was admitted to the hospital with working diagnosis of atypical chest pain, in the setting of progressive chronic kidney disease, acute kidney injury on chronic kidney disease stage IV.   Assessment & Plan:   Active Problems:   Lupus anticoagulant positive   HTN (hypertension)   Seizures (HCC)   ARF (acute renal failure) ([place])    1. Atypical chest pain. Patient had further workup with V/Q and lower extremity dopplers, venous thromboembolism was ruled out.   2. Antiphospholipid antibody. Continue anticoagulation with heparin, plan to start warfarin after vascular procedure. Target INR 2 to 3.  Patient agrees to change to warfarin.   2. AKI on CKD stage IV- V/ Lupus nephritis/ VH:QIONGEXB focal proliferative and early sclerosing lupus nephritis and membranous lupus nephritis. Mild arteriosclerosis with mild interstitial fibrosis and tubular atrophy.Renal function with serum cr at 9,43  with K at 4,4 and serum bicarbonate at 21. No signs of volume overload. Continue Phoslo and calcitriol. Pending vascular access, follow with vascular surgery recommendations.   3. HTN.Continue with mlodipine and carvedilol for blood pressure control.  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 Will need follow as outpatient. Continue Epo. Target Hb 11.   5. Seizures.Onlamotriginefor seizure control.   6. Tobacco abuse.  Smoking cessation, continue with nicotine patch.  DVT prophylaxis:heparin Code Status:full Family Communication:no family at the bedside Disposition Plan/ discharge barriers:patient will need therapeutic INR before discharge.  Consultants:  Nephrology  Procedures:    Antimicrobials:   Subjective: Patient is feeling well, no nausea or vomiting, no chest pain or dyspnea, has been ambulating. No edema.   Objective: Vitals:   12/23/17 1624 12/23/17 2000 12/24/17 0324 12/24/17 0700  BP: (!) 145/97 (!) 123/95  127/86  Pulse:    80  Resp: (!) 21 (!) 21    Temp: 99 F (37.2 C) 98.8 F (37.1 C)  99.2 F (37.3 C)  TempSrc: Oral Oral  Oral  SpO2:      Weight:   122.6 kg (270 lb 3.2 oz)   Height:        Intake/Output Summary (Last 24 hours) at 12/24/2017 0923 Last data filed at 12/23/2017 2300 Gross per 24 hour  Intake 138.03 ml  Output -  Net 138.03 ml   Filed Weights   12/22/17 0557 12/23/17 0420 12/24/17 0324  Weight:  123.2 kg (271 lb 9.7 oz) 123.2 kg (271 lb 8 oz) 122.6 kg (270 lb 3.2 oz)    Examination:   General: Not in pain or dyspnea.  Neurology:  Awake and alert, non focal  E ENT: no pallor, no icterus, oral mucosa moist Cardiovascular: No JVD. S1-S2 present, rhythmic, no gallops, rubs, or murmurs. Trace lower extremity edema. Pulmonary: vesicular breath sounds bilaterally, adequate air movement, no wheezing, rhonchi or rales. Gastrointestinal. Abdomen with no organomegaly, non tender, no rebound or guarding Skin. No rashes Musculoskeletal: no joint deformities     Data Reviewed: I have personally reviewed following labs and imaging studies  CBC: Recent Labs  Lab 12/21/17 0045 12/22/17 0701 12/23/17 0237 12/24/17 0407  WBC 7.8 7.3 6.6 7.3  HGB 10.0* 9.3* 9.4* 9.4*  HCT 29.5* 28.2* 28.4* 28.4*  MCV 91.0 91.6 91.0 91.3  PLT 171 144* 157 761*   Basic Metabolic Panel: Recent Labs  Lab 12/21/17 0045 12/21/17 1217 12/22/17 0701 12/23/17 0237 12/24/17 0407  NA 137  --  136  137 138 135  135  K 4.4  --  4.3  4.2 4.5 4.4  4.3  CL 104  --  106  104 105 102  100  CO2 22  --  23  23 24  21*  22  GLUCOSE 95  --  85  88 106* 92  95  BUN 58*  --  60*  60* 61* 65*  64*  CREATININE 9.66*  --  9.52*  9.21* 9.56* 9.43*  9.48*  CALCIUM 8.4*  --  8.3*  8.5* 8.6*  8.4* 8.7*  8.7*  PHOS  --  5.2* 5.8* 5.7* 5.7*   GFR: Estimated Creatinine Clearance: 14.6 mL/min (A) (by C-G formula based on SCr of 9.43 mg/dL (H)). Liver Function Tests: Recent Labs  Lab 12/22/17 0701 12/23/17 0237 12/24/17 0407  ALBUMIN 2.8* 2.7* 2.8*   No results for input(s): LIPASE, AMYLASE in the last 168 hours. No results for input(s): AMMONIA in the last 168 hours. Coagulation Profile: Recent Labs  Lab 12/22/17 0701 12/24/17 0407  INR 1.14 1.03   Cardiac Enzymes: Recent Labs  Lab 12/21/17 1217  TROPONINI <0.03   BNP (last 3 results) No results for input(s): PROBNP in the last 8760 hours. HbA1C: No results for input(s): HGBA1C in the last 72 hours. CBG: No results for input(s): GLUCAP in the last 168 hours. Lipid  Profile: No results for input(s): CHOL, HDL, LDLCALC, TRIG, CHOLHDL, LDLDIRECT in the last 72 hours. Thyroid Function Tests: No results for input(s): TSH, T4TOTAL, FREET4, T3FREE, THYROIDAB in the last 72 hours. Anemia Panel: Recent Labs    12/21/17 1217  FERRITIN 165  TIBC 228*  IRON 20*      Radiology Studies: I have reviewed all of the imaging during this hospital visit personally     Scheduled Meds: . amLODipine  10 mg Oral QHS  . calcitRIOL  0.25 mcg Oral Daily  . calcium acetate  667 mg Oral TID WC  . carvedilol  3.125 mg Oral BID WC  . darbepoetin (ARANESP) injection - NON-DIALYSIS  100 mcg Subcutaneous Q Mon-1800  . lamoTRIgine  100 mg Oral Q8H  . nicotine  21 mg Transdermal Daily   Continuous Infusions: . cefUROXime (ZINACEF)  IV    . ferumoxytol Stopped (12/22/17 0921)  . heparin 1,300 Units/hr (12/24/17 0225)     LOS: 3 days        Abbigal Radich Gerome Apley, MD [facility] Pager [phone]

## 2017-12-24 NOTE — Progress Notes (Signed)
[place] for heparin Indication: r/o DVT, hx cva/lupus AC  Allergies  Allergen Reactions  . Zyrtec [Cetirizine]     Facial swelling    Patient Measurements: Height: 6\' 1"  (185.4 cm) Weight: 270 lb 3.2 oz (122.6 kg) IBW/kg (Calculated) : 79.9 Heparin Dosing Weight:  107.4kg  Vital Signs: Temp: 99.2 F (37.3 C) (07/09 0700) Temp Source: Oral (07/09 0700) BP: 127/86 (07/09 0700) Pulse Rate: 80 (07/09 0700)  Labs: Recent Labs    12/22/17 0701 12/22/17 0702 12/22/17 1840 12/23/17 0237 12/24/17 0407  HGB 9.3*  --   --  9.4* 9.4*  HCT 28.2*  --   --  28.4* 28.4*  PLT 144*  --   --  157 147*  APTT 141*  --  87* 99* 82*  LABPROT 14.6  --   --   --  13.4  INR 1.14  --   --   --  1.03  HEPARINUNFRC  --  0.47  --  0.11* <0.10*  CREATININE 9.52*  9.21*  --   --  9.56* 9.43*  9.48*    Estimated Creatinine Clearance: 14.6 mL/min (A) (by C-G formula based on SCr of 9.43 mg/dL (H)).   Assessment: Patient on apixaban prior to admission for CVA/lupus +.  Per ED PA, patient seemed unsure of last dose time for apixaban but reported 7/5, noted history of medicine non-compliance. Pt has likely cleared apixaban. No b/l aPTT/HL.   Heparin level <0.10 today, aPTT elevated at 82 may be affected lupus, on 1300 units/hr. Hgb 9.4, plt 147. No s/sx of bleeding.   HL and aPTT do not correlate, will dose heparin based on HL and discontinue aPTT.  Goal of Therapy:  Heparin level 0.3-0.7 units/ml Monitor platelets by anticoagulation protocol: Yes   Plan:  -Increase heparin to 1600 units/hr -Heparin level at [place], PharmD PGY1 Pharmacy Resident Phone: [phone] 12/24/2017 12:28 PM

## 2017-12-24 NOTE — Progress Notes (Signed)
Subjective: Interval History: has no complaint, no questions at this time.  Objective: Vital signs in last 24 hours: Temp:  [98.8 F (37.1 C)-99.3 F (37.4 C)] 98.8 F (37.1 C) (07/08 2000) Pulse Rate:  [73] 73 (07/08 0759) Resp:  [16-21] 21 (07/08 2000) BP: (123-148)/(82-97) 123/95 (07/08 2000) Weight:  [122.6 kg (270 lb 3.2 oz)] 122.6 kg (270 lb 3.2 oz) (07/09 0324) Weight change: -0.59 kg (-1 lb 4.8 oz)  Intake/Output from previous day: 07/08 0701 - 07/09 0700 In: 249 [I.V.:249] Out: -  Intake/Output this shift: No intake/output data recorded.  General appearance: alert, cooperative and no distress Resp: clear to auscultation bilaterally Cardio: S1, S2 normal and systolic murmur: systolic ejection 2/6, decrescendo at 2nd left intercostal space GI: soft, non-tender; bowel sounds normal; no masses,  no organomegaly Extremities: extremities normal, atraumatic, no cyanosis or edema  Lab Results: Recent Labs    12/23/17 0237 12/24/17 0407  WBC 6.6 7.3  HGB 9.4* 9.4*  HCT 28.4* 28.4*  PLT 157 147*   BMET:  Recent Labs    12/23/17 0237 12/24/17 0407  NA 138 135  135  K 4.5 4.4  4.3  CL 105 102  100  CO2 24 21*  22  GLUCOSE 106* 92  95  BUN 61* 65*  64*  CREATININE 9.56* 9.43*  9.48*  CALCIUM 8.6* 8.7*  8.7*   Recent Labs    12/21/17 1217  PTH 199*   Iron Studies:  Recent Labs    12/21/17 1217  IRON 20*  TIBC 228*  FERRITIN 165    Studies/Results: No results found.  I have reviewed the patient's current medications.  Assessment/Plan: 1 CKD 5 for access.. Mild acidemia. Not uremic . Hopefully access can heal 2 HTN controlled 3 SLE inactive 4 APLA on hep 5 anemia Fe,esa 6 HPTH vit D P education, access, bp control, esa, vit D    LOS: 3 days   Jeneen Rinks [place] Sweigert 12/24/2017,7:19 AM

## 2017-12-24 NOTE — Progress Notes (Signed)
[place] for heparin Indication: r/o DVT, hx cva/lupus AC  Allergies  Allergen Reactions  . Zyrtec [Cetirizine]     Facial swelling    Patient Measurements: Height: 6\' 1"  (185.4 cm) Weight: 270 lb 3.2 oz (122.6 kg) IBW/kg (Calculated) : 79.9 Heparin Dosing Weight:  107.4kg  Vital Signs: BP: 127/86 (07/09 0834)  Labs: Recent Labs    12/22/17 0701  12/22/17 1840 12/23/17 0237 12/24/17 0407 12/24/17 1834  HGB 9.3*  --   --  9.4* 9.4*  --   HCT 28.2*  --   --  28.4* 28.4*  --   PLT 144*  --   --  157 147*  --   APTT 141*  --  87* 99* 82*  --   LABPROT 14.6  --   --   --  13.4  --   INR 1.14  --   --   --  1.03  --   HEPARINUNFRC  --    < >  --  0.11* <0.10* 0.10*  CREATININE 9.52*  9.21*  --   --  9.56* 9.43*  9.48*  --    < > = values in this interval not displayed.    Estimated Creatinine Clearance: 14.6 mL/min (A) (by C-G formula based on SCr of 9.43 mg/dL (H)).   Assessment: Patient on apixaban prior to admission for CVA/lupus +.  Per ED PA, patient seemed unsure of last dose time for apixaban but reported 7/5, noted history of medicine non-compliance. Pt has likely cleared apixaban. No b/l aPTT/HL.   Heparin level now up to 0.10 this evening, aPTT no longer being checked as lupus ac can spur results, currently on 1600 units/hr. No s/sx of bleeding.   Goal of Therapy:  Heparin level 0.3-0.7 units/ml Monitor platelets by anticoagulation protocol: Yes   Plan:  -Increase heparin to 1950 units/hr -Heparin level in am   Erin Hearing PharmD., BCPS Clinical Pharmacist 12/24/2017 7:26 PM
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
## 2017-12-25 DIAGNOSIS — I10 Essential (primary) hypertension: Secondary | ICD-10-CM

## 2017-12-25 DIAGNOSIS — N185 Chronic kidney disease, stage 5: Secondary | ICD-10-CM

## 2017-12-25 DIAGNOSIS — R569 Unspecified convulsions: Secondary | ICD-10-CM

## 2017-12-25 DIAGNOSIS — N179 Acute kidney failure, unspecified: Secondary | ICD-10-CM

## 2017-12-25 DIAGNOSIS — R079 Chest pain, unspecified: Secondary | ICD-10-CM

## 2017-12-25 DIAGNOSIS — R760 Raised antibody titer: Secondary | ICD-10-CM

## 2017-12-25 LAB — RENAL FUNCTION PANEL
ALBUMIN: 2.9 g/dL — AB (ref 3.5–5.0)
ANION GAP: 13 (ref 5–15)
BUN: 70 mg/dL — ABNORMAL HIGH (ref 6–20)
CALCIUM: 9.1 mg/dL (ref 8.9–10.3)
CO2: 24 mmol/L (ref 22–32)
Chloride: 101 mmol/L (ref 98–111)
Creatinine, Ser: 9.83 mg/dL — ABNORMAL HIGH (ref 0.61–1.24)
GFR, EST AFRICAN AMERICAN: 7 mL/min — AB (ref >60)
GFR, EST NON AFRICAN AMERICAN: 6 mL/min — AB (ref >60)
GLUCOSE: 93 mg/dL (ref 70–99)
PHOSPHORUS: 6.3 mg/dL — AB (ref 2.5–4.6)
Potassium: 4.3 mmol/L (ref 3.5–5.1)
Sodium: 138 mmol/L (ref 135–145)

## 2017-12-25 LAB — CBC
HEMATOCRIT: 30.3 % — AB (ref 39.0–52.0)
HEMOGLOBIN: 9.8 g/dL — AB (ref 13.0–17.0)
MCH: 29.7 pg (ref 26.0–34.0)
MCHC: 32.3 g/dL (ref 30.0–36.0)
MCV: 91.8 fL (ref 78.0–100.0)
Platelets: 146 10*3/uL — ABNORMAL LOW (ref 150–400)
RBC: 3.3 MIL/uL — AB (ref 4.22–5.81)
RDW: 12.1 % (ref 11.5–15.5)
WBC: 7.5 10*3/uL (ref 4.0–10.5)

## 2017-12-25 LAB — HEPARIN LEVEL (UNFRACTIONATED)
HEPARIN UNFRACTIONATED: 0.39 [IU]/mL (ref 0.30–0.70)
Heparin Unfractionated: 0.28 IU/mL — ABNORMAL LOW (ref 0.30–0.70)

## 2017-12-25 NOTE — Progress Notes (Signed)
  [name] Progress Note    Assessment/ Plan:   37y/o man SLE now CKDV not req dialysis but plan for elective access placement to hopefully avoid a catheter at time of initiation.  1 CKD 5 for access. Mild acidemia. Not uremic . Hopefully access can heal - May just need to be d/c with elective outpt placement of access given sterile processing issues. 2 HTN controlled 3 SLE inactive 4 APLA on hep 5 anemia Fe,esa 6 HPTH vit D     Subjective:   Feeling well. Denies f/c/n/v/cp/dyspnea.   Objective:   BP 127/86   Pulse 80   Temp 99.2 F (37.3 C) (Oral)   Resp (!) 21   Ht 6\' 1"  (1.854 m)   Wt 122.2 kg (269 lb 6.4 oz)   SpO2 100%   BMI 35.54 kg/m   Intake/Output Summary (Last 24 hours) at 12/25/2017 1719 Last data filed at 12/25/2017 1100 Gross per 24 hour  Intake 647 ml  Output -  Net 647 ml   Weight change: -0.362 kg (-12.8 oz)  Physical Exam: General appearance: alert, cooperative and no distress Resp: clear to auscultation bilaterally Cardio: S1, S2 normal and systolic murmur: systolic ejection 2/6, decrescendo at 2nd left intercostal space GI: soft, non-tender; bowel sounds normal; no masses,  no organomegaly Extremities: extremities normal, atraumatic, no cyanosis or edema   Imaging: No results found.  Labs: BMET Recent Labs  Lab 12/21/17 0045 12/21/17 1217 12/22/17 0701 12/23/17 0237 12/24/17 0407 12/25/17 0348  NA 137  --  136  137 138 135  135 138  K 4.4  --  4.3  4.2 4.5 4.4  4.3 4.3  CL 104  --  106  104 105 102  100 101  CO2 22  --  23  23 24  21*  22 24  GLUCOSE 95  --  85  88 106* 92  95 93  BUN 58*  --  60*  60* 61* 65*  64* 70*  CREATININE 9.66*  --  9.52*  9.21* 9.56* 9.43*  9.48* 9.83*  CALCIUM 8.4*  --  8.3*  8.5* 8.6*  8.4* 8.7*  8.7* 9.1  PHOS  --  5.2* 5.8* 5.7* 5.7* 6.3*   CBC Recent Labs  Lab 12/22/17 0701 12/23/17 0237 12/24/17 0407 12/25/17 0348  WBC 7.3 6.6 7.3 7.5  HGB 9.3* 9.4* 9.4*  9.8*  HCT 28.2* 28.4* 28.4* 30.3*  MCV 91.6 91.0 91.3 91.8  PLT 144* 157 147* 146*    Medications:    . amLODipine  10 mg Oral QHS  . calcitRIOL  0.25 mcg Oral Daily  . calcium acetate  667 mg Oral TID WC  . carvedilol  3.125 mg Oral BID WC  . darbepoetin (ARANESP) injection - NON-DIALYSIS  100 mcg Subcutaneous Q Mon-1800  . lamoTRIgine  100 mg Oral Q8H  . nicotine  21 mg Transdermal Daily      Otelia Santee, MD 12/25/2017, 5:19 PM

## 2017-12-25 NOTE — Progress Notes (Signed)
[facility]  PROGRESS NOTE  [name] [name] MWU:[id number] DOB: [birth year] DOA: 12/21/2017 PCP: Alfonse Spruce, FNP   Brief HPI:   37 year old male came with chest pain, has history of antiphospholipid antibody syndrome, CVA, CKD stage IV, hypertension.  Patient had work-up for atypical chest pain with VQ scan and lower extremity Dopplers, venous thrombi embolism was ruled out.  He is on chronic anticoagulation with heparin for antiphospholipid antibody syndrome.  Patient has worsening of renal function with serum creatinine 9.43.  No signs of volume overload.  Awaiting vascular access.    Subjective   Denies chest pain or shortness of breath.   Assessment/Plan:     1. Atypical chest pain-resolved, troponin x3-.  Also had work-up for thrombus but resumed, VQ scan low probability for pulmonary embolism. 2. Antiphospholipid antibody syndrome-continue anticoagulation with heparin, plan to start warfarin after vascular procedure. 3. Acute kidney injury on CKD stage IV-patient has lupus nephritis, biopsy showed combined focal proliferative and early sclerosing lupus nephritis and membranous lupus nephritis.  Mild arterial sclerosis with mild interstitial fibrosis and tubular atrophy.  Renal function with serum creatinine of 9.83 nephrology was consulted.. Plan for vascular access with vascular surgery. 4. Hypertension-blood pressure stable, continue amlodipine, carvedilol. 5. Combined anemia of iron deficiency and CKD stage IV-hemoglobin stable at 9.8.  Continue Epo.   6. Seizures-continue lamotrigine for seizure control 7. Tobacco abuse-continue nicotine patch     DVT prophylaxis: Heparin  Code Status: Full code  Family Communication: No family at bedside  Disposition Plan: likely home when medically ready for discharge   Consultants:  Nephrology  Procedures:  None   Antibiotics:   Anti-infectives (From admission, onward)   Start     Dose/Rate Route Frequency  Ordered Stop   12/24/17 0600  cefUROXime (ZINACEF) 1.5 g in sodium chloride 0.9 % 100 mL IVPB     1.5 g 200 mL/hr over 30 Minutes Intravenous On call to O.R. 12/23/17 1027 12/25/17 0559       Objective   Vitals:   12/24/17 0324 12/24/17 0700 12/24/17 0834 12/25/17 0500  BP:  127/86 127/86   Pulse:  80    Resp:      Temp:  99.2 F (37.3 C)    TempSrc:  Oral    SpO2:      Weight: 122.6 kg (270 lb 3.2 oz)   122.2 kg (269 lb 6.4 oz)  Height:        Intake/Output Summary (Last 24 hours) at 12/25/2017 1656 Last data filed at 12/25/2017 1100 Gross per 24 hour  Intake 647 ml  Output -  Net 647 ml   Filed Weights   12/23/17 0420 12/24/17 0324 12/25/17 0500  Weight: 123.2 kg (271 lb 8 oz) 122.6 kg (270 lb 3.2 oz) 122.2 kg (269 lb 6.4 oz)     Physical Examination:    General:  *Appears in no acute distress  Cardiovascular: S1-S2, regular  Respiratory: Clear to auscultation bilaterally  Abdomen: Soft, nontender, no organomegaly  Extremities: No edema in lower extremities  Neurologic: Alert, oriented x3    Data Reviewed: I have personally reviewed following labs and imaging studies  CBG: No results for input(s): GLUCAP in the last 168 hours.  CBC: Recent Labs  Lab 12/21/17 0045 12/22/17 0701 12/23/17 0237 12/24/17 0407 12/25/17 0348  WBC 7.8 7.3 6.6 7.3 7.5  HGB 10.0* 9.3* 9.4* 9.4* 9.8*  HCT 29.5* 28.2* 28.4* 28.4* 30.3*  MCV 91.0 91.6 91.0 91.3 91.8  PLT 171 144*  157 147* 146*    Basic Metabolic Panel: Recent Labs  Lab 12/21/17 0045 12/21/17 1217 12/22/17 0701 12/23/17 0237 12/24/17 0407 12/25/17 0348  NA 137  --  136  137 138 135  135 138  K 4.4  --  4.3  4.2 4.5 4.4  4.3 4.3  CL 104  --  106  104 105 102  100 101  CO2 22  --  23  23 24  21*  22 24  GLUCOSE 95  --  85  88 106* 92  95 93  BUN 58*  --  60*  60* 61* 65*  64* 70*  CREATININE 9.66*  --  9.52*  9.21* 9.56* 9.43*  9.48* 9.83*  CALCIUM 8.4*  --  8.3*  8.5* 8.6*   8.4* 8.7*  8.7* 9.1  PHOS  --  5.2* 5.8* 5.7* 5.7* 6.3*    Recent Results (from the past 240 hour(s))  MRSA PCR Screening     Status: None   Collection Time: 12/21/17  9:06 AM  Result Value Ref Range Status   MRSA by PCR NEGATIVE NEGATIVE Final    Comment:        The GeneXpert MRSA Assay (FDA approved for NASAL specimens only), is one component of a comprehensive MRSA colonization surveillance program. It is not intended to diagnose MRSA infection nor to guide or monitor treatment for MRSA infections. Performed at [facility], [place] [address] [place], [place] [address]      Liver Function Tests: Recent Labs  Lab 12/22/17 0701 12/23/17 0237 12/24/17 0407 12/25/17 0348  ALBUMIN 2.8* 2.7* 2.8* 2.9*   No results for input(s): LIPASE, AMYLASE in the last 168 hours. No results for input(s): AMMONIA in the last 168 hours.  Cardiac Enzymes: Recent Labs  Lab 12/21/17 1217  TROPONINI <0.03   BNP (last 3 results) No results for input(s): BNP in the last 8760 hours.  ProBNP (last 3 results) No results for input(s): PROBNP in the last 8760 hours.    Studies: No results found.  Scheduled Meds: . amLODipine  10 mg Oral QHS  . calcitRIOL  0.25 mcg Oral Daily  . calcium acetate  667 mg Oral TID WC  . carvedilol  3.125 mg Oral BID WC  . darbepoetin (ARANESP) injection - NON-DIALYSIS  100 mcg Subcutaneous Q Mon-1800  . lamoTRIgine  100 mg Oral Q8H  . nicotine  21 mg Transdermal Daily      Time spent: 25 minutes  Oswald Hillock   [facility] Pager [phone]. If 7PM-7AM, please contact night-coverage at [email or web address], Office  [phone]  password [email or web address]  12/25/2017, 4:56 PM  LOS: 4 days

## 2017-12-25 NOTE — H&P (View-Only) (Signed)
Still unable to post elective cases in the operating room due to sterile processing issues.  Patient is tentatively scheduled for AV access on Monday, 12/30/2017.  If he is discharged he can be readmitted for outpatient surgery

## 2017-12-25 NOTE — Plan of Care (Signed)
Patient will work on achieving these goals.

## 2017-12-25 NOTE — Progress Notes (Signed)
Nutrition Education Follow-Up Note  RD re-consulted for renal diet education.   RD stopped in to check in on pt, who was sleeping soundly at time of visit. RD did not disturb. RD educated pt extensively (approximately 40 minutes) at last visit on 12/23/17. RD contact information and education handout has been provided. RD informed pt that RD at HD center would able to support him with continued diet modifications.   Body mass index is 35.82 kg/m. Pt meets criteria for obesity, class II based on current BMI.  Current diet order is renal with 1200 ml fluid restriction, patient is consuming approximately 100% of meals at this time. Labs and medications reviewed. No further nutrition interventions warranted at this time. RD contact information provided. If additional nutrition issues arise, please re-consult RD.  Danyl Deems A. Jimmye Norman, RD, LDN, CDE Pager: [phone] After hours Pager: [phone]

## 2017-12-25 NOTE — Progress Notes (Signed)
[place] for heparin Indication: r/o DVT, hx cva/lupus AC  Allergies  Allergen Reactions  . Zyrtec [Cetirizine]     Facial swelling    Patient Measurements: Height: 6\' 1"  (185.4 cm) Weight: 270 lb 3.2 oz (122.6 kg) IBW/kg (Calculated) : 79.9 Heparin Dosing Weight:  107.4kg  Vital Signs:    Labs: Recent Labs    12/22/17 0701  12/22/17 1840 12/23/17 0237 12/24/17 0407 12/24/17 1834 12/25/17 0348  HGB 9.3*  --   --  9.4* 9.4*  --  9.8*  HCT 28.2*  --   --  28.4* 28.4*  --  30.3*  PLT 144*  --   --  157 147*  --  146*  APTT 141*  --  87* 99* 82*  --   --   LABPROT 14.6  --   --   --  13.4  --   --   INR 1.14  --   --   --  1.03  --   --   HEPARINUNFRC  --    < >  --  0.11* <0.10* 0.10* 0.28*  CREATININE 9.52*  9.21*  --   --  9.56* 9.43*  9.48*  --   --    < > = values in this interval not displayed.    Estimated Creatinine Clearance: 14.6 mL/min (A) (by C-G formula based on SCr of 9.43 mg/dL (H)).   Assessment: Patient on apixaban prior to admission for CVA/lupus +.  Per ED PA, patient seemed unsure of last dose time for apixaban but reported 7/5, noted history of medicine non-compliance. Pt has likely cleared apixaban. Pharmacy consulted for Heparin dosing.   Heparin level this morning remains SUBtherapeutic despite a rate increase yesterday (0.28 << 0.1, goal of 0.3-0.7) though trending up. CBC stable - no active bleeding noted. RN reports infusing appropriate and no pauses.   Goal of Therapy:  Heparin level 0.3-0.7 units/ml Monitor platelets by anticoagulation protocol: Yes   Plan:  - Increase Heparin to 2150 units/hr (21.5 ml/hr) - Will continue to monitor for any signs/symptoms of bleeding and will follow up with heparin level in 8 hours   Thank you for allowing pharmacy to be a part of this patient's care.  Alycia Rossetti, PharmD, BCPS Clinical Pharmacist Pager: [phone] If after 3:30p, please call main pharmacy  at: [phone] 12/25/2017 4:54 AM

## 2017-12-25 NOTE — Progress Notes (Signed)
Still unable to post elective cases in the operating room due to sterile processing issues.  Patient is tentatively scheduled for AV access on Monday, 12/30/2017.  If he is discharged he can be readmitted for outpatient surgery

## 2017-12-25 NOTE — Care Management Note (Addendum)
Case Management Note  Patient Details  Name: Oley Lahaie MRN: [id number] Date of Birth: August 17, 1980  Subjective/Objective:        Pt admitted with ARF - may get perm HD access before discharge.            Action/Plan:  PTA independent from home. Pt confirmed he can afford copay for Eliquis and other PTA medications - pt has been utilizing reduced copay card for Eliquis.   Expected Discharge Date:  12/24/17               Expected Discharge Plan:  Home/Self Care(Independent from home, PCP at [facility], denied barriers with obtaining/paying medication)  In-House Referral:     Discharge planning Services  CM Consult  Post Acute Care Choice:    Choice offered to:     DME Arranged:    DME Agency:     HH Arranged:    HH Agency:     Status of Service:  In process, will continue to follow  If discussed at Long Length of Stay Meetings, dates discussed:    Additional Comments: Update:  It has not yet been determined if pt will have HD initiated here at [place] requested attending provide clarity Maryclare Labrador, RN 12/25/2017, 3:00 PM

## 2017-12-25 NOTE — Progress Notes (Signed)
[place] for heparin Indication: r/o DVT, hx cva/lupus AC  Allergies  Allergen Reactions  . Zyrtec [Cetirizine]     Facial swelling    Patient Measurements: Height: 6\' 1"  (185.4 cm) Weight: 269 lb 6.4 oz (122.2 kg) IBW/kg (Calculated) : 79.9 Heparin Dosing Weight:  107.4kg  Vital Signs:    Labs: Recent Labs    12/22/17 1840  12/23/17 0237 12/24/17 0407 12/24/17 1834 12/25/17 0348 12/25/17 1249  HGB  --    < > 9.4* 9.4*  --  9.8*  --   HCT  --   --  28.4* 28.4*  --  30.3*  --   PLT  --   --  157 147*  --  146*  --   APTT 87*  --  99* 82*  --   --   --   LABPROT  --   --   --  13.4  --   --   --   INR  --   --   --  1.03  --   --   --   HEPARINUNFRC  --    < > 0.11* <0.10* 0.10* 0.28* 0.39  CREATININE  --   --  9.56* 9.43*  9.48*  --  9.83*  --    < > = values in this interval not displayed.    Estimated Creatinine Clearance: 14 mL/min (A) (by C-G formula based on SCr of 9.83 mg/dL (H)).   Assessment: Patient on apixaban prior to admission for CVA/lupus +.  Per ED PA, patient seemed unsure of last dose time for apixaban but reported 7/5, noted history of medicine non-compliance. Pt has likely cleared apixaban. Pharmacy consulted for Heparin dosing.    Heparin level this afternoon came back therapeutic at 0.39, on 2150 units/hr. Hgb 9.8, plt 146. No s/sx of bleeding. No infusion issues per nursing.   Goal of Therapy:  Heparin level 0.3-0.7 units/ml Monitor platelets by anticoagulation protocol: Yes   Plan:  - Continue heparin at 2150 units/hr - Monitor daily HL, CBC, and for s/sx of bleeding  Thank you for allowing pharmacy to be a part of this patient's care.  Doylene Canard, PharmD Clinical Pharmacist  Pager: [phone] Phone: [phone] 12/25/2017 2:16 PM

## 2017-12-26 ENCOUNTER — Other Ambulatory Visit: Payer: Self-pay | Admitting: *Deleted

## 2017-12-26 LAB — RENAL FUNCTION PANEL
ANION GAP: 12 (ref 5–15)
Albumin: 2.9 g/dL — ABNORMAL LOW (ref 3.5–5.0)
BUN: 69 mg/dL — ABNORMAL HIGH (ref 6–20)
CHLORIDE: 102 mmol/L (ref 98–111)
CO2: 23 mmol/L (ref 22–32)
Calcium: 9.3 mg/dL (ref 8.9–10.3)
Creatinine, Ser: 10.53 mg/dL — ABNORMAL HIGH (ref 0.61–1.24)
GFR calc Af Amer: 6 mL/min — ABNORMAL LOW (ref >60)
GFR calc non Af Amer: 5 mL/min — ABNORMAL LOW (ref >60)
GLUCOSE: 97 mg/dL (ref 70–99)
POTASSIUM: 5.1 mmol/L (ref 3.5–5.1)
Phosphorus: 5.7 mg/dL — ABNORMAL HIGH (ref 2.5–4.6)
Sodium: 137 mmol/L (ref 135–145)

## 2017-12-26 LAB — CBC
HEMATOCRIT: 28.9 % — AB (ref 39.0–52.0)
Hemoglobin: 9.6 g/dL — ABNORMAL LOW (ref 13.0–17.0)
MCH: 30.2 pg (ref 26.0–34.0)
MCHC: 33.2 g/dL (ref 30.0–36.0)
MCV: 90.9 fL (ref 78.0–100.0)
Platelets: 130 10*3/uL — ABNORMAL LOW (ref 150–400)
RBC: 3.18 MIL/uL — ABNORMAL LOW (ref 4.22–5.81)
RDW: 12.1 % (ref 11.5–15.5)
WBC: 9.7 10*3/uL (ref 4.0–10.5)

## 2017-12-26 LAB — HEPARIN LEVEL (UNFRACTIONATED): Heparin Unfractionated: 0.39 IU/mL (ref 0.30–0.70)

## 2017-12-26 MED ORDER — CEFAZOLIN SODIUM-DEXTROSE 2-4 GM/100ML-% IV SOLN: 2.0000 g | INTRAVENOUS | Status: DC

## 2017-12-26 MED ORDER — CALCITRIOL 0.25 MCG PO CAPS: 0.2500 ug | ORAL_CAPSULE | Freq: Every day | ORAL | 2 refills | Status: DC

## 2017-12-26 MED ORDER — CHLORHEXIDINE GLUCONATE 4 % EX LIQD
60.0000 mL | Freq: Once | CUTANEOUS | Status: DC
Start: 2017-12-27 — End: 2017-12-26

## 2017-12-26 MED ORDER — SODIUM CHLORIDE 0.9 % IV SOLN
INTRAVENOUS | Status: DC
Start: 2017-12-26 — End: 2017-12-26

## 2017-12-26 MED ORDER — CARVEDILOL 3.125 MG PO TABS: 3.1250 mg | ORAL_TABLET | Freq: Two times a day (BID) | ORAL | 1 refills | Status: DC

## 2017-12-26 MED ORDER — CHLORHEXIDINE GLUCONATE 4 % EX LIQD: 60.0000 mL | Freq: Once | CUTANEOUS | Status: DC

## 2017-12-26 MED ORDER — CALCIUM ACETATE (PHOS BINDER) 667 MG PO CAPS: 1334.0000 mg | ORAL_CAPSULE | Freq: Three times a day (TID) | ORAL | 3 refills | Status: DC

## 2017-12-26 MED ORDER — APIXABAN 5 MG PO TABS: 5.0000 mg | ORAL_TABLET | Freq: Two times a day (BID) | ORAL | 1 refills | Status: DC

## 2017-12-26 MED FILL — CALCITRIOL 0.25 MCG CAPS: 0.25 | 30 days supply | Qty: 30 | Fill #0

## 2017-12-26 MED FILL — ELIQUIS 5 MG TABLET: 5 | 30 days supply | Qty: 60 | Fill #0

## 2017-12-26 MED FILL — CARVEDILOL 3.125 MG TABLET: 3.125 | 30 days supply | Qty: 60 | Fill #0

## 2017-12-26 MED FILL — CALCIUM ACETATE (PHOS BINDE: 667 | 30 days supply | Qty: 210 | Fill #0

## 2017-12-26 NOTE — Progress Notes (Signed)
[place] for heparin Indication: r/o DVT, hx cva/lupus AC  Allergies  Allergen Reactions  . Zyrtec [Cetirizine]     Facial swelling    Patient Measurements: Height: 6\' 1"  (185.4 cm) Weight: 268 lb 4.8 oz (121.7 kg) IBW/kg (Calculated) : 79.9 HEPARIN DW (KG): 107.2  Vital Signs: Temp: 98.5 F (36.9 C) (07/11 0922) Temp Source: Oral (07/11 0922) BP: 140/90 (07/11 0922) Pulse Rate: 78 (07/11 0922)  Labs: Recent Labs    12/24/17 0407  12/25/17 0348 12/25/17 1249 12/26/17 0426  HGB 9.4*  --  9.8*  --  9.6*  HCT 28.4*  --  30.3*  --  28.9*  PLT 147*  --  146*  --  130*  APTT 82*  --   --   --   --   LABPROT 13.4  --   --   --   --   INR 1.03  --   --   --   --   HEPARINUNFRC <0.10*   < > 0.28* 0.39 0.39  CREATININE 9.43*  9.48*  --  9.83*  --  10.53*   < > = values in this interval not displayed.    Estimated Creatinine Clearance: 13 mL/min (A) (by C-G formula based on SCr of 10.53 mg/dL (H)).   Assessment: Patient on apixaban prior to admission for CVA/lupus +. Pharmacy consulted for Heparin dosing.    Heparin level remains therapeutic at 0.39, on 2150 units/hr. Hgb 9.6, plt 130. No s/sx of bleeding. No infusion issues per nursing.   Goal of Therapy:  Heparin level 0.3-0.7 units/ml Monitor platelets by anticoagulation protocol: Yes   Plan:  - Continue heparin at 2150 units/hr - Monitor daily HL, CBC, and for s/sx of bleeding  Thank you for allowing pharmacy to be a part of this patient's care.  Claiborne Billings, PharmD PGY2 Cardiology Pharmacy Resident Phone [phone] 12/26/2017 10:07 AM

## 2017-12-26 NOTE — Discharge Planning (Signed)
D/c education and papper work done per protocol using the teach back method.

## 2017-12-26 NOTE — Discharge Summary (Signed)
Physician Discharge Summary  [name] [name] [id number] DOB: Nov 16, 1980 DOA: 12/21/2017  PCP: Alfonse Spruce, FNP  Admit date: 12/21/2017 Discharge date: 12/26/2017  Time spent: 45 minutes  Recommendations for Outpatient Follow-up:  1. Follow up vascular surgery in 1 week 2. Follow up Nephrology in 2 weeks   Discharge Diagnoses:  Active Problems:   Lupus anticoagulant positive   HTN (hypertension)   Seizures (HCC)   ARF (acute renal failure) ([place])   Discharge Condition: Stable  Diet recommendation: Heart healthy diet  Filed Weights   12/24/17 0324 12/25/17 0500 12/26/17 0616  Weight: 122.6 kg (270 lb 3.2 oz) 122.2 kg (269 lb 6.4 oz) 121.7 kg (268 lb 4.8 oz)    History of present illness:  37 year old male came with chest pain, has history of antiphospholipid antibody syndrome, CVA, CKD stage IV, hypertension.  Patient had work-up for atypical chest pain with VQ scan and lower extremity Dopplers, venous thrombi embolism was ruled out.  He is on chronic anticoagulation with heparin for antiphospholipid antibody syndrome.  Patient has worsening of renal function with serum creatinine 9.43.  No signs of volume overload.  Awaiting vascular access    Hospital Course:  1. Atypical chest pain-resolved, troponin x3-.  Also had work-up for thromboembolism, VQ scan low probability for pulmonary embolism. 2. Antiphospholipid antibody syndrome-continue anticoagulation with heparin, will start Eliquis 5 mg po bid, called and discussed with nephrologist Dr Augustin Coupe. 3. Acute kidney injury on CKD stage IV-patient has lupus nephritis, biopsy showed combined focal proliferative and early sclerosing lupus nephritis and membranous lupus nephritis.  Mild arterial sclerosis with mild interstitial fibrosis and tubular atrophy.  Renal function with serum creatinine of 9.83 nephrology was consulted.. Plan for vascular access with vascular surgery as outpatient. Vascular access could not be placed during  this hospitalization due to sterile processing issues  4. Hypertension-blood pressure stable, continue amlodipine, carvedilol. 5. Combined anemia of iron deficiency and CKD stage IV-hemoglobin stable at 9.8.  6. Seizures-continue lamotrigine for seizure control      Procedures:  None   Consultations:  Nephrology  vascular surgery  Discharge Exam: Vitals:   12/25/17 1925 12/26/17 0922  BP: 114/82 140/90  Pulse:  78  Resp:  17  Temp: 98.5 F (36.9 C) 98.5 F (36.9 C)  SpO2:  99%    General: Appears in no acute distress Cardiovascular: S1S2 RRR Respiratory: Clear bilaterally  Discharge Instructions   Discharge Instructions    Diet - low sodium heart healthy   Complete by:  As directed    Increase activity slowly   Complete by:  As directed      Allergies as of 12/26/2017      Reactions   Zyrtec [cetirizine]    Facial swelling      Medication List    STOP taking these medications   clotrimazole 1 % cream Commonly known as:  LOTRIMIN   furosemide 20 MG tablet Commonly known as:  LASIX   rivaroxaban 20 MG Tabs tablet Commonly known as:  XARELTO     TAKE these medications   albuterol 108 (90 Base) MCG/ACT inhaler Commonly known as:  PROVENTIL HFA;VENTOLIN HFA Inhale 1-2 puffs into the lungs every 4 (four) hours as needed for wheezing or shortness of breath (or cough).   amLODipine 10 MG tablet Commonly known as:  NORVASC Take 1 tablet (10 mg total) by mouth daily.   apixaban 5 MG Tabs tablet Commonly known as:  ELIQUIS Take 1 tablet (5 mg total) by mouth  2 (two) times daily.   atorvastatin 40 MG tablet Commonly known as:  LIPITOR Take 1 tablet (40 mg total) by mouth daily.   calcitRIOL 0.25 MCG capsule Commonly known as:  ROCALTROL Take 1 capsule (0.25 mcg total) by mouth daily. Start taking on:  12/27/2017   calcium acetate 667 MG capsule Commonly known as:  PHOSLO Take 2 capsules (1,334 mg total) by mouth 3 (three) times daily with  meals. Take extra 1 capsule with snacks   carvedilol 3.125 MG tablet Commonly known as:  COREG Take 1 tablet (3.125 mg total) by mouth 2 (two) times daily with a meal.   LamoTRIgine 300 MG Tb24 24 hour tablet Commonly known as:  LAMICTAL XR Take 1 tablet (300 mg total) by mouth daily.      Allergies  Allergen Reactions  . Zyrtec [Cetirizine]     Facial swelling   Follow-up Information    Early, Arvilla Meres, MD. Schedule an appointment as soon as possible for a visit in 1 week(s).   Specialties:  Vascular Surgery, Cardiology Contact information: [place] [address] [phone]        Dwana Melena, MD. Schedule an appointment as soon as possible for a visit in 2 week(s).   Specialty:  Nephrology Contact information: [place] [id number] [phone]            The results of significant diagnostics from this hospitalization (including imaging, microbiology, ancillary and laboratory) are listed below for reference.    Significant Diagnostic Studies: Dg Chest 2 View  Result Date: 12/21/2017 CLINICAL DATA:  Chest pain and back pain since Monday. Shortness of breath. History of hypertension. Current smoker. EXAM: CHEST - 2 VIEW COMPARISON:  06/29/2017 FINDINGS: The heart size and mediastinal contours are within normal limits. Both lungs are clear. The visualized skeletal structures are unremarkable. IMPRESSION: No active cardiopulmonary disease. Electronically Signed   By: Lucienne Capers M.D.   On: 12/21/2017 00:59   US Renal  Result Date: 12/21/2017 CLINICAL DATA:  Chronic renal disease. Systemic lupus erythematosus. Antiphospholipid antibody syndrome EXAM: RENAL / URINARY TRACT ULTRASOUND COMPLETE COMPARISON:  None. FINDINGS: Right Kidney: Length: 11.7 cm. Echogenicity is increased. Renal cortical thickness is normal. No perinephric fluid or hydronephrosis visualized. There is a cyst arising from the lower pole right kidney measuring 2.8 x 2.5 x  2.2 cm. No sonographically demonstrable calculus or ureterectasis. Left Kidney: Length: 11.6 cm. Echogenicity is increased. Renal cortical thickness is normal. No perinephric fluid or hydronephrosis visualized. There is a cyst arising from the lower pole left kidney measuring 1.5 x 0.8 x 1.0 cm. A second cyst in this area measures 1.2 x 1.0 x 0.9 cm. No sonographically demonstrable calculus or ureterectasis. Bladder: Appears normal for degree of bladder distention. IMPRESSION: Increase in renal echogenicity bilaterally, a finding indicative of medical renal disease. Renal cortical thickness is normal bilaterally. No obstructing focus in either kidney. There are cysts in each kidney. Electronically Signed   By: Lowella Grip III M.D.   On: 12/21/2017 11:03   Nm Pulmonary Perf And Vent  Result Date: 12/21/2017 CLINICAL DATA:  Chest pain. EXAM: NUCLEAR MEDICINE VENTILATION - PERFUSION LUNG SCAN VIEWS: Anterior, posterior, left lateral, right lateral, RPO, LPO, RAO, LAO-ventilation and perfusion RADIOPHARMACEUTICALS:  31.0 mCi of Tc-57m DTPA aerosol inhalation and 4.2 mCi Tc41m-MAA IV COMPARISON:  Chest radiograph December 21, 2017 FINDINGS: Ventilation: Radiotracer uptake is homogeneous and symmetric bilaterally. No ventilation defects are evident. Perfusion: Radiotracer uptake is  homogeneous and symmetric bilaterally. No perfusion defects are evident. IMPRESSION: No appreciable ventilation or perfusion defects. This study constitutes a very low probability of pulmonary embolus. Electronically Signed   By: Lowella Grip III M.D.   On: 12/21/2017 09:45    Microbiology: Recent Results (from the past 240 hour(s))  MRSA PCR Screening     Status: None   Collection Time: 12/21/17  9:06 AM  Result Value Ref Range Status   MRSA by PCR NEGATIVE NEGATIVE Final    Comment:        The GeneXpert MRSA Assay (FDA approved for NASAL specimens only), is one component of a comprehensive MRSA colonization surveillance  program. It is not intended to diagnose MRSA infection nor to guide or monitor treatment for MRSA infections. Performed at [facility], [place] [address], [place], [place] [address]      Labs: Basic Metabolic Panel: Recent Labs  Lab 12/22/17 0701 12/23/17 0237 12/24/17 0407 12/25/17 0348 12/26/17 0426  NA 136  137 138 135  135 138 137  K 4.3  4.2 4.5 4.4  4.3 4.3 5.1  CL 106  104 105 102  100 101 102  CO2 23  23 24  21*  22 24 23   GLUCOSE 85  88 106* 92  95 93 97  BUN 60*  60* 61* 65*  64* 70* 69*  CREATININE 9.52*  9.21* 9.56* 9.43*  9.48* 9.83* 10.53*  CALCIUM 8.3*  8.5* 8.6*  8.4* 8.7*  8.7* 9.1 9.3  PHOS 5.8* 5.7* 5.7* 6.3* 5.7*   Liver Function Tests: Recent Labs  Lab 12/22/17 0701 12/23/17 0237 12/24/17 0407 12/25/17 0348 12/26/17 0426  ALBUMIN 2.8* 2.7* 2.8* 2.9* 2.9*   No results for input(s): LIPASE, AMYLASE in the last 168 hours. No results for input(s): AMMONIA in the last 168 hours. CBC: Recent Labs  Lab 12/22/17 0701 12/23/17 0237 12/24/17 0407 12/25/17 0348 12/26/17 0426  WBC 7.3 6.6 7.3 7.5 9.7  HGB 9.3* 9.4* 9.4* 9.8* 9.6*  HCT 28.2* 28.4* 28.4* 30.3* 28.9*  MCV 91.6 91.0 91.3 91.8 90.9  PLT 144* 157 147* 146* 130*   Cardiac Enzymes: Recent Labs  Lab 12/21/17 1217  TROPONINI <0.03       Signed:  Oswald Hillock MD.  [facility] 12/26/2017, 10:42 AM

## 2017-12-26 NOTE — Progress Notes (Signed)
  [facility] Progress Note    Assessment/ Plan:   37y/o man SLE now CKDV not req dialysis but plan for elective access placement to hopefully avoid a catheter at time of initiation.  1 CKD 5 for access. Mild acidemia. Not uremic . Hopefully access can heal - Agree that it's reasonable to  d/c with elective outpt placement of access given sterile processing issues. Notified Dr. Donnetta Hutching as well and pt also instructed to call to ensure an appt has been set. - He will likely be initiating dialysis within the next 4 mths; therefore, a vascular access will be critical to avoid a TC. - He has a booklet but I also instructed him further on the type of diet he needs to follow. 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 Denies dyspnea, n/v/cp/f/c   Objective:   BP 114/82 (BP Location: Right Arm)   Pulse 80   Temp 98.5 F (36.9 C) (Oral)   Resp (!) 21   Ht 6\' 1"  (1.854 m)   Wt 121.7 kg (268 lb 4.8 oz)   SpO2 100%   BMI 35.40 kg/m   Intake/Output Summary (Last 24 hours) at 12/26/2017 2774 Last data filed at 12/26/2017 0700 Gross per 24 hour  Intake 566.19 ml  Output -  Net 566.19 ml   Weight change: -0.5 kg (-1 lb 1.6 oz)  Physical Exam: General appearance:alert, cooperative and no distress Resp:clear to auscultation bilaterally Cardio:S1, S2 normal and systolic murmur:systolic JOINOMVE7/2 CN:OBSJ, non-tender; bowel sounds normal Extremities:extremities normal, atraumatic, no cyanosis or edema    Imaging: No results found.  Labs: BMET Recent Labs  Lab 12/21/17 0045 12/21/17 1217 12/22/17 0701 12/23/17 0237 12/24/17 0407 12/25/17 0348 12/26/17 0426  NA 137  --  136  137 138 135  135 138 137  K 4.4  --  4.3  4.2 4.5 4.4  4.3 4.3 5.1  CL 104  --  106  104 105 102  100 101 102  CO2 22  --  23  23 24  21*  22 24 23    GLUCOSE 95  --  85  88 106* 92  95 93 97  BUN 58*  --  60*  60* 61* 65*  64* 70* 69*  CREATININE 9.66*  --  9.52*  9.21* 9.56* 9.43*  9.48* 9.83* 10.53*  CALCIUM 8.4*  --  8.3*  8.5* 8.6*  8.4* 8.7*  8.7* 9.1 9.3  PHOS  --  5.2* 5.8* 5.7* 5.7* 6.3* 5.7*   CBC Recent Labs  Lab 12/23/17 0237 12/24/17 0407 12/25/17 0348 12/26/17 0426  WBC 6.6 7.3 7.5 9.7  HGB 9.4* 9.4* 9.8* 9.6*  HCT 28.4* 28.4* 30.3* 28.9*  MCV 91.0 91.3 91.8 90.9  PLT 157 147* 146* 130*    Medications:    . amLODipine  10 mg Oral QHS  . calcitRIOL  0.25 mcg Oral Daily  . calcium acetate  667 mg Oral TID WC  . carvedilol  3.125 mg Oral BID WC  . darbepoetin (ARANESP) injection - NON-DIALYSIS  100 mcg Subcutaneous Q Mon-1800  . lamoTRIgine  100 mg Oral Q8H  . nicotine  21 mg Transdermal Daily      Otelia Santee, MD 12/26/2017, 8:28 AM

## 2017-12-26 NOTE — Progress Notes (Signed)
Per insurance check for Eliquis-   # 3. S/W JOYCE @ [facility] # [phone]    ELIQUIS 5 MG BID  COVER- YES  CO-PAY- $ 222.88  TIER- 2 DRUG  PRIOR APPROVAL- NO  NO DEDUCTIBLE   PREFERRED PHARMACY : YES  [facility]  [place]

## 2017-12-26 NOTE — Discharge Instructions (Signed)
Information on my medicine - ELIQUIS (apixaban)  This medication education was reviewed with me or my healthcare representative as part of my discharge preparation.    Why was Eliquis prescribed for you? Eliquis was prescribed for you to reduce the risk of forming blood clots due to your Antiphospholipid Antibody Syndrome.  What do You need to know about Eliquis ? Take your Eliquis TWICE DAILY - one tablet in the morning and one tablet in the evening with or without food.  It would be best to take the doses about the same time each day.  If you have difficulty swallowing the tablet whole please discuss with your pharmacist how to take the medication safely.  Take Eliquis exactly as prescribed by your doctor and DO NOT stop taking Eliquis without talking to the doctor who prescribed the medication.  Stopping may increase your risk of developing a new clot or stroke.  Refill your prescription before you run out.  After discharge, you should have regular check-up appointments with your healthcare provider that is prescribing your Eliquis.  In the future your dose may need to be changed if your kidney function or weight changes by a significant amount or as you get older.  What do you do if you miss a dose? If you miss a dose, take it as soon as you remember on the same day and resume taking twice daily.  Do not take more than one dose of ELIQUIS at the same time.  Important Safety Information A possible side effect of Eliquis is bleeding. You should call your healthcare provider right away if you experience any of the following: ? Bleeding from an injury or your nose that does not stop. ? Unusual colored urine (red or dark brown) or unusual colored stools (red or black). ? Unusual bruising for unknown reasons. ? A serious fall or if you hit your head (even if there is no bleeding).  Some medicines may interact with Eliquis and might increase your risk of bleeding or clotting while on  Eliquis. To help avoid this, consult your healthcare provider or pharmacist prior to using any new prescription or non-prescription medications, including herbals, vitamins, non-steroidal anti-inflammatory drugs (NSAIDs) and supplements.  This website has more information on Eliquis (apixaban): [email or web address].no.

## 2017-12-26 NOTE — Care Management Note (Signed)
Case Management Note Previous CM note completed by Maryclare Labrador, RN 12/25/2017, 3:00 PM    Patient Details  Name: [name] [name] MRN: [id number] Date of Birth: 1980/08/18  Subjective/Objective:        Pt admitted with ARF - may get perm HD access before discharge.            Action/Plan:  PTA independent from home. Pt confirmed he can afford copay for Eliquis and other PTA medications - pt has been utilizing reduced copay card for Eliquis.   Expected Discharge Date:  12/26/17               Expected Discharge Plan:  Home/Self Care(Independent from home, PCP at [facility], denied barriers with obtaining/paying medication)  In-House Referral:     Discharge planning Services  CM Consult  Post Acute Care Choice:    Choice offered to:     DME Arranged:    DME Agency:     HH Arranged:    HH Agency:     Status of Service:  In process, will continue to follow  If discussed at Long Length of Stay Meetings, dates discussed:    Additional Comments:  12/26/17- 1500- Mayer Vondrak RN, CM- completed benefits check for Eliquis- per previous CM note- pt has been using copay assist card for Eliquis and has not been having any difficulty with cost- uses [facility] for pharmacy needs. MD to send script to [facility].   Maryclare Labrador, RN 12/25/2017, 3:00 PM Update:  It has not yet been determined if pt will have HD initiated here at [facility] requested attending provide clarity  Dawayne Patricia, RN 12/26/2017, 3:02 PM

## 2017-12-27 ENCOUNTER — Other Ambulatory Visit: Payer: Self-pay

## 2017-12-27 ENCOUNTER — Encounter (HOSPITAL_COMMUNITY): Payer: Self-pay | Admitting: *Deleted

## 2017-12-27 ENCOUNTER — Telehealth: Payer: Self-pay | Admitting: *Deleted

## 2017-12-27 NOTE — Telephone Encounter (Signed)
Phone call to patient instructed to hold Eliquis for 2 days prior to surgery on 12/30/17. To be at [facility] admitting department at 9 am. NPO past MN night prior and to follow the detailed pre-op instructions/meds to take day of he receives from the pre-admission depart for this surgery. MUST have a driver for home. Repeated over to ensure patient's understanding.

## 2017-12-27 NOTE — Progress Notes (Signed)
Pt denies any acute cardiopulmonary issues. Pt denies being under the care of a cardiologist. Pt denies having a stress test and cardiac cath. Pt made aware to stop taking vitamins, fish oil and herbal medications. Do not take any NSAIDs ie: Ibuprofen, Advil, Naproxen (Aleve), Motrin, BC and Goody Powder. Pt verbalized understanding of all pre-op instructions.

## 2017-12-27 NOTE — Progress Notes (Signed)
   12/27/17 1746  OBSTRUCTIVE SLEEP APNEA  Have you ever been diagnosed with sleep apnea through a sleep study? No  Do you snore loudly (loud enough to be heard through closed doors)?  1  Do you often feel tired, fatigued, or sleepy during the daytime (such as falling asleep during driving or talking to someone)? 0  Has anyone observed you stop breathing during your sleep? 0  Do you have, or are you being treated for high blood pressure? 1  BMI more than 35 kg/m2? 1  Age > 50 (1-yes) 0  Neck circumference greater than:Male 16 inches or larger, Male 17inches or larger? 1  Male Gender (Yes=1) 1  Obstructive Sleep Apnea Score 5

## 2017-12-30 ENCOUNTER — Ambulatory Visit (HOSPITAL_COMMUNITY): Payer: BLUE CROSS/BLUE SHIELD | Admitting: Anesthesiology

## 2017-12-30 ENCOUNTER — Ambulatory Visit (HOSPITAL_COMMUNITY)
Admission: RE | Admit: 2017-12-30 | Discharge: 2017-12-30 | Disposition: A | Discharge status: Home / Self Care | Payer: BLUE CROSS/BLUE SHIELD | Source: Ambulatory Visit | Attending: Vascular Surgery | Admitting: Vascular Surgery

## 2017-12-30 ENCOUNTER — Encounter (HOSPITAL_COMMUNITY): Payer: Self-pay

## 2017-12-30 ENCOUNTER — Other Ambulatory Visit: Payer: Self-pay

## 2017-12-30 ENCOUNTER — Encounter (HOSPITAL_COMMUNITY)
Admission: RE | Disposition: A | Discharge status: Home / Self Care | Payer: Self-pay | Source: Ambulatory Visit | Attending: Vascular Surgery

## 2017-12-30 DIAGNOSIS — Z79899 Other long term (current) drug therapy: Secondary | ICD-10-CM | POA: Insufficient documentation

## 2017-12-30 DIAGNOSIS — R569 Unspecified convulsions: Secondary | ICD-10-CM | POA: Diagnosis not present

## 2017-12-30 DIAGNOSIS — E669 Obesity, unspecified: Secondary | ICD-10-CM | POA: Diagnosis not present

## 2017-12-30 DIAGNOSIS — Z87891 Personal history of nicotine dependence: Secondary | ICD-10-CM | POA: Diagnosis not present

## 2017-12-30 DIAGNOSIS — D649 Anemia, unspecified: Secondary | ICD-10-CM | POA: Diagnosis not present

## 2017-12-30 DIAGNOSIS — Z9889 Other specified postprocedural states: Secondary | ICD-10-CM | POA: Diagnosis not present

## 2017-12-30 DIAGNOSIS — Z6835 Body mass index (BMI) 35.0-35.9, adult: Secondary | ICD-10-CM | POA: Diagnosis not present

## 2017-12-30 DIAGNOSIS — B079 Viral wart, unspecified: Secondary | ICD-10-CM | POA: Insufficient documentation

## 2017-12-30 DIAGNOSIS — Z8673 Personal history of transient ischemic attack (TIA), and cerebral infarction without residual deficits: Secondary | ICD-10-CM | POA: Diagnosis not present

## 2017-12-30 DIAGNOSIS — M329 Systemic lupus erythematosus, unspecified: Secondary | ICD-10-CM | POA: Insufficient documentation

## 2017-12-30 DIAGNOSIS — N183 Chronic kidney disease, stage 3 (moderate): Secondary | ICD-10-CM | POA: Diagnosis not present

## 2017-12-30 DIAGNOSIS — Z888 Allergy status to other drugs, medicaments and biological substances status: Secondary | ICD-10-CM | POA: Insufficient documentation

## 2017-12-30 DIAGNOSIS — Z7902 Long term (current) use of antithrombotics/antiplatelets: Secondary | ICD-10-CM | POA: Diagnosis not present

## 2017-12-30 DIAGNOSIS — N189 Chronic kidney disease, unspecified: Secondary | ICD-10-CM | POA: Insufficient documentation

## 2017-12-30 DIAGNOSIS — I129 Hypertensive chronic kidney disease with stage 1 through stage 4 chronic kidney disease, or unspecified chronic kidney disease: Secondary | ICD-10-CM | POA: Diagnosis not present

## 2017-12-30 DIAGNOSIS — Z992 Dependence on renal dialysis: Secondary | ICD-10-CM | POA: Insufficient documentation

## 2017-12-30 DIAGNOSIS — N185 Chronic kidney disease, stage 5: Secondary | ICD-10-CM | POA: Diagnosis not present

## 2017-12-30 HISTORY — PX: AV FISTULA PLACEMENT: SHX1204

## 2017-12-30 HISTORY — DX: Chronic kidney disease, unspecified: N18.9

## 2017-12-30 LAB — POCT I-STAT 4, (NA,K, GLUC, HGB,HCT)
Glucose, Bld: 104 mg/dL — ABNORMAL HIGH (ref 70–99)
HEMATOCRIT: 27 % — AB (ref 39.0–52.0)
HEMOGLOBIN: 9.2 g/dL — AB (ref 13.0–17.0)
Potassium: 4.6 mmol/L (ref 3.5–5.1)
SODIUM: 135 mmol/L (ref 135–145)

## 2017-12-30 LAB — PROTIME-INR
INR: 1.06
Prothrombin Time: 13.7 seconds (ref 11.4–15.2)

## 2017-12-30 SURGERY — ARTERIOVENOUS (AV) FISTULA CREATION
Anesthesia: General | Site: Arm Upper | Laterality: Left

## 2017-12-30 MED ORDER — 0.9 % SODIUM CHLORIDE (POUR BTL) OPTIME
TOPICAL | Status: DC | PRN
  Administered 2017-12-30: 1000 mL

## 2017-12-30 MED ORDER — EPHEDRINE SULFATE 50 MG/ML IJ SOLN
INTRAMUSCULAR | Status: DC | PRN
  Administered 2017-12-30 (×4): 10 mg via INTRAVENOUS

## 2017-12-30 MED ORDER — LIDOCAINE 2% (20 MG/ML) 5 ML SYRINGE
INTRAMUSCULAR | Status: AC
  Filled 2017-12-30: qty 5

## 2017-12-30 MED ORDER — DEXTROSE 5 % IV SOLN
3.0000 g | INTRAVENOUS | Status: AC
  Administered 2017-12-30: 3 g via INTRAVENOUS
  Filled 2017-12-30: qty 3

## 2017-12-30 MED ORDER — HEPARIN SODIUM (PORCINE) 5000 UNIT/ML IJ SOLN
INTRAMUSCULAR | Status: AC
  Filled 2017-12-30: qty 1.2

## 2017-12-30 MED ORDER — PROPOFOL 500 MG/50ML IV EMUL
INTRAVENOUS | Status: DC | PRN
  Administered 2017-12-30: 100 ug/kg/min via INTRAVENOUS

## 2017-12-30 MED ORDER — MIDAZOLAM HCL 5 MG/5ML IJ SOLN
INTRAMUSCULAR | Status: DC | PRN
  Administered 2017-12-30: 2 mg via INTRAVENOUS

## 2017-12-30 MED ORDER — ONDANSETRON HCL 4 MG/2ML IJ SOLN
INTRAMUSCULAR | Status: AC
  Filled 2017-12-30: qty 2

## 2017-12-30 MED ORDER — PROPOFOL 10 MG/ML IV BOLUS
INTRAVENOUS | Status: DC | PRN
  Administered 2017-12-30: 100 mg via INTRAVENOUS

## 2017-12-30 MED ORDER — SODIUM CHLORIDE 0.9 % IV SOLN
INTRAVENOUS | Status: DC | PRN
  Administered 2017-12-30 (×2): via INTRAVENOUS

## 2017-12-30 MED ORDER — PROPOFOL 10 MG/ML IV BOLUS
INTRAVENOUS | Status: AC
  Filled 2017-12-30: qty 40

## 2017-12-30 MED ORDER — FENTANYL CITRATE (PF) 100 MCG/2ML IJ SOLN
INTRAMUSCULAR | Status: DC | PRN
  Administered 2017-12-30: 75 ug via INTRAVENOUS

## 2017-12-30 MED ORDER — SODIUM CHLORIDE 0.9 % IV SOLN
INTRAVENOUS | Status: DC | PRN
  Administered 2017-12-30: 100 ug/min via INTRAVENOUS

## 2017-12-30 MED ORDER — OXYCODONE HCL 5 MG/5ML PO SOLN: 5.0000 mg | Freq: Once | ORAL | Status: DC | PRN

## 2017-12-30 MED ORDER — LIDOCAINE-EPINEPHRINE 0.5 %-1:200000 IJ SOLN
INTRAMUSCULAR | Status: DC | PRN
  Administered 2017-12-30: 50 mL

## 2017-12-30 MED ORDER — LIDOCAINE-EPINEPHRINE 0.5 %-1:200000 IJ SOLN
INTRAMUSCULAR | Status: AC
  Filled 2017-12-30: qty 1

## 2017-12-30 MED ORDER — PHENYLEPHRINE 40 MCG/ML (10ML) SYRINGE FOR IV PUSH (FOR BLOOD PRESSURE SUPPORT)
PREFILLED_SYRINGE | INTRAVENOUS | Status: AC
  Filled 2017-12-30: qty 10

## 2017-12-30 MED ORDER — PHENYLEPHRINE HCL 10 MG/ML IJ SOLN
INTRAMUSCULAR | Status: DC | PRN
  Administered 2017-12-30: 100 ug via INTRAVENOUS

## 2017-12-30 MED ORDER — MIDAZOLAM HCL 2 MG/2ML IJ SOLN
INTRAMUSCULAR | Status: AC
  Filled 2017-12-30: qty 2

## 2017-12-30 MED ORDER — HYDROMORPHONE HCL 1 MG/ML IJ SOLN: 0.2500 mg | INTRAMUSCULAR | Status: DC | PRN

## 2017-12-30 MED ORDER — HEPARIN SODIUM (PORCINE) 5000 UNIT/ML IJ SOLN
INTRAMUSCULAR | Status: DC | PRN
  Administered 2017-12-30: 12:00:00

## 2017-12-30 MED ORDER — EPHEDRINE SULFATE 50 MG/ML IJ SOLN
INTRAMUSCULAR | Status: AC
  Filled 2017-12-30: qty 1

## 2017-12-30 MED ORDER — TRAMADOL HCL 50 MG PO TABS: 50.0000 mg | ORAL_TABLET | Freq: Four times a day (QID) | ORAL | 0 refills | Status: DC | PRN

## 2017-12-30 MED ORDER — FENTANYL CITRATE (PF) 250 MCG/5ML IJ SOLN
INTRAMUSCULAR | Status: AC
  Filled 2017-12-30: qty 5

## 2017-12-30 MED ORDER — PROMETHAZINE HCL 25 MG/ML IJ SOLN: 6.2500 mg | INTRAMUSCULAR | Status: DC | PRN

## 2017-12-30 MED ORDER — ALBUMIN HUMAN 5 % IV SOLN
INTRAVENOUS | Status: DC | PRN
  Administered 2017-12-30: 12:00:00 via INTRAVENOUS

## 2017-12-30 MED ORDER — ONDANSETRON HCL 4 MG/2ML IJ SOLN
INTRAMUSCULAR | Status: DC | PRN
  Administered 2017-12-30: 4 mg via INTRAVENOUS

## 2017-12-30 MED ORDER — OXYCODONE HCL 5 MG PO TABS: 5.0000 mg | ORAL_TABLET | Freq: Once | ORAL | Status: DC | PRN

## 2017-12-30 MED FILL — traMADol HCL 50 MG TABS: 50 | 5 days supply | Qty: 20 | Fill #0

## 2017-12-30 SURGICAL SUPPLY — 27 items
ADH SKN CLS APL DERMABOND .7 (GAUZE/BANDAGES/DRESSINGS) ×1
ARMBAND PINK RESTRICT EXTREMIT (MISCELLANEOUS) ×4 IMPLANT
CANISTER SUCT 3000ML PPV (MISCELLANEOUS) ×2 IMPLANT
CANNULA VESSEL 3MM 2 BLNT TIP (CANNULA) ×2 IMPLANT
CLIP LIGATING EXTRA MED SLVR (CLIP) ×2 IMPLANT
CLIP LIGATING EXTRA SM BLUE (MISCELLANEOUS) ×2 IMPLANT
COVER PROBE W GEL 5X96 (DRAPES) ×2 IMPLANT
DECANTER SPIKE VIAL GLASS SM (MISCELLANEOUS) ×2 IMPLANT
DERMABOND ADVANCED (GAUZE/BANDAGES/DRESSINGS) ×1
DERMABOND ADVANCED .7 DNX12 (GAUZE/BANDAGES/DRESSINGS) ×1 IMPLANT
ELECT REM PT RETURN 9FT ADLT (ELECTROSURGICAL) ×2
ELECTRODE REM PT RTRN 9FT ADLT (ELECTROSURGICAL) ×1 IMPLANT
GLOVE SS BIOGEL STRL SZ 7.5 (GLOVE) ×1 IMPLANT
GLOVE SUPERSENSE BIOGEL SZ 7.5 (GLOVE) ×1
GOWN STRL REUS W/ TWL LRG LVL3 (GOWN DISPOSABLE) ×3 IMPLANT
GOWN STRL REUS W/TWL LRG LVL3 (GOWN DISPOSABLE) ×6
KIT BASIN OR (CUSTOM PROCEDURE TRAY) ×2 IMPLANT
KIT TURNOVER KIT B (KITS) ×2 IMPLANT
NS IRRIG 1000ML POUR BTL (IV SOLUTION) ×2 IMPLANT
PACK CV ACCESS (CUSTOM PROCEDURE TRAY) ×2 IMPLANT
PAD ARMBOARD 7.5X6 YLW CONV (MISCELLANEOUS) ×4 IMPLANT
SUT PROLENE 6 0 CC (SUTURE) ×2 IMPLANT
SUT VIC AB 3-0 SH 27 (SUTURE) ×2
SUT VIC AB 3-0 SH 27X BRD (SUTURE) ×1 IMPLANT
TOWEL GREEN STERILE (TOWEL DISPOSABLE) ×2 IMPLANT
UNDERPAD 30X30 (UNDERPADS AND DIAPERS) ×2 IMPLANT
WATER STERILE IRR 1000ML POUR (IV SOLUTION) ×2 IMPLANT

## 2017-12-30 NOTE — Anesthesia Procedure Notes (Signed)
Procedure Name: LMA Insertion Date/Time: 12/30/2017 12:03 PM Performed by: Eligha Bridegroom, CRNA Pre-anesthesia Checklist: Patient identified, Emergency Drugs available, Suction available, Patient being monitored and Timeout performed Patient Re-evaluated:Patient Re-evaluated prior to induction Oxygen Delivery Method: Circle system utilized Preoxygenation: Pre-oxygenation with 100% oxygen Induction Type: IV induction Ventilation: Mask ventilation without difficulty LMA: LMA inserted LMA Size: 5.0 Number of attempts: 1 Placement Confirmation: positive ETCO2 and breath sounds checked- equal and bilateral Tube secured with: Tape Dental Injury: Teeth and Oropharynx as per pre-operative assessment

## 2017-12-30 NOTE — Interval H&P Note (Signed)
History and Physical Interval Note:  12/30/2017 10:58 AM  [name] [name]  has presented today for surgery, with the diagnosis of FOR HEMODIALYSIS ACCESS  The various methods of treatment have been discussed with the patient and family. After consideration of risks, benefits and other options for treatment, the patient has consented to  Procedure(s): ARTERIOVENOUS (AV) FISTULA CREATION VERSUS INSERTION OF ARTERIOVENOUS GRAFT LEFT ARM (Left) as a surgical intervention .  The patient's history has been reviewed, patient examined, no change in status, stable for surgery.  I have reviewed the patient's chart and labs.  Questions were answered to the patient's satisfaction.     Curt Jews

## 2017-12-30 NOTE — Transfer of Care (Signed)
Immediate Anesthesia Transfer of Care Note  Patient: [name] [name]  Procedure(s) Performed: ARTERIOVENOUS (AV) FISTULA CREATION VERSUS INSERTION OF ARTERIOVENOUS GRAFT LEFT ARM (Left Arm Upper)  Patient Location: PACU  Anesthesia Type:General  Level of Consciousness: awake, alert , oriented and patient cooperative  Airway & Oxygen Therapy: Patient Spontanous Breathing and Patient connected to nasal cannula oxygen  Post-op Assessment: Report given to RN and Post -op Vital signs reviewed and stable  Post vital signs: Reviewed and stable  Last Vitals:  Vitals Value Taken Time  BP 143/89 12/30/2017  1:15 PM  Temp    Pulse 74 12/30/2017  1:17 PM  Resp 18 12/30/2017  1:17 PM  SpO2 96 % 12/30/2017  1:17 PM  Vitals shown include unvalidated device data.  Last Pain:  Vitals:   12/30/17 0935  TempSrc: Oral  PainSc: 0-No pain      Patients Stated Pain Goal: 0 (25/74/93 5521)  Complications: No apparent anesthesia complications

## 2017-12-30 NOTE — Anesthesia Procedure Notes (Signed)
Procedure Name: MAC Date/Time: 12/30/2017 11:35 AM Performed by: Eligha Bridegroom, CRNA Pre-anesthesia Checklist: Patient identified, Emergency Drugs available, Suction available, Patient being monitored and Timeout performed Patient Re-evaluated:Patient Re-evaluated prior to induction Oxygen Delivery Method: Nasal cannula Preoxygenation: Pre-oxygenation with 100% oxygen Induction Type: IV induction

## 2017-12-30 NOTE — Anesthesia Preprocedure Evaluation (Addendum)
Anesthesia Evaluation  Patient identified by MRN, date of birth, ID band Patient awake    Reviewed: Allergy & Precautions, NPO status , Patient's Chart, lab work & pertinent test results, reviewed documented beta blocker date and time   Airway Mallampati: III  TM Distance: >3 FB Neck ROM: Full    Dental no notable dental hx.    Pulmonary former smoker,    Pulmonary exam normal breath sounds clear to auscultation       Cardiovascular hypertension, Pt. on home beta blockers and Pt. on medications Normal cardiovascular exam Rhythm:Regular Rate:Normal  ECG: SR, rate 81  ECHO: LV EF: 60% -   65%   Neuro/Psych Seizures -, Well Controlled,  CVA, Residual Symptoms negative psych ROS   GI/Hepatic negative GI ROS, Neg liver ROS,   Endo/Other  negative endocrine ROS  Renal/GU CRFRenal disease     Musculoskeletal negative musculoskeletal ROS (+)   Abdominal (+) + obese,   Peds  Hematology  (+) anemia , HLD   Anesthesia Other Findings HEMODIALYSIS ACCESS  Reproductive/Obstetrics                            Anesthesia Physical Anesthesia Plan  ASA: III  Anesthesia Plan: MAC   Post-op Pain Management:    Induction: Intravenous  PONV Risk Score and Plan: 1 and Propofol infusion, Ondansetron and Treatment may vary due to age or medical condition  Airway Management Planned: Simple Face Mask  Additional Equipment:   Intra-op Plan:   Post-operative Plan:   Informed Consent: I have reviewed the patients History and Physical, chart, labs and discussed the procedure including the risks, benefits and alternatives for the proposed anesthesia with the patient or authorized representative who has indicated his/her understanding and acceptance.   Dental advisory given  Plan Discussed with: CRNA  Anesthesia Plan Comments:        Anesthesia Quick Evaluation

## 2017-12-30 NOTE — Op Note (Signed)
    OPERATIVE REPORT  DATE OF SURGERY: 12/30/2017  PATIENT: [name] [name], 37 y.o. male MRN: [id number]  DOB: January 01, 1981  PRE-OPERATIVE DIAGNOSIS: Chronic renal insufficiency  POST-OPERATIVE DIAGNOSIS:  Same  PROCEDURE: Left upper arm AV fistula creation brachiocephalic  SURGEON:  Curt Jews, M.D.  PHYSICIAN ASSISTANT: Nurse  ANESTHESIA: LMA  EBL: Minimal ml  Total I/O In: 1050 [I.V.:750; IV Piggyback:300] Out: 40 [Blood:40]  BLOOD ADMINISTERED: None  DRAINS: None  SPECIMEN: None  COUNTS CORRECT:  YES  PLAN OF CARE: PACU  PATIENT DISPOSITION:  PACU - hemodynamically stable  PROCEDURE DETAILS: Patient was taken to the operating room placed in supine position where the area of the left arm was prepped and draped in the usual sterile fashion.  Sinus site ultrasound was used to visualize the cephalic vein.  The vein was small at the wrist and was small to moderate size in the forearm.  The vein was large caliber at the antecubital space in the upper arm.  Incision was made transversely at the antecubital space and could not isolate the brachial artery which was normal caliber and the cephalic vein which was of large caliber.  Tributary branches of the cephalic vein were ligated with 3-0 silk ties and divided.  The vein was ligated distally and was gently dilated.  The vein was brought into approximation of the brachial artery.  The brachial artery was occluded proximally and distally and was opened with an 11 blade and extended longitudinally with smart Potts scissors.  A small arteriotomy was created.  The vein was cut to the appropriate length and was sewn end-to-side to the artery with a running 6-0 Prolene suture.  Clamps were removed and a good thrill was noted.  The wounds irrigated with saline.  Hemostasis after cautery.  Patient is wound was closed with 3-0 Vicryl in the subcutaneous and subcuticular tissue.  He did have a good radial Doppler flow with slight augmentation of  occlusion of his fistula at the completion of the case   Rosetta Posner, M.D., [facility] 12/30/2017 2:01 PM

## 2017-12-31 ENCOUNTER — Encounter (HOSPITAL_COMMUNITY): Payer: Self-pay | Admitting: Vascular Surgery

## 2017-12-31 NOTE — Anesthesia Postprocedure Evaluation (Signed)
Anesthesia Post Note  Patient: Psychologist, educational  Procedure(s) Performed: ARTERIOVENOUS (AV) FISTULA CREATION VERSUS INSERTION OF ARTERIOVENOUS GRAFT LEFT ARM (Left Arm Upper)     Patient location during evaluation: PACU Anesthesia Type: General Level of consciousness: awake and alert Pain management: pain level controlled Vital Signs Assessment: post-procedure vital signs reviewed and stable Respiratory status: spontaneous breathing, nonlabored ventilation, respiratory function stable and patient connected to nasal cannula oxygen Cardiovascular status: blood pressure returned to baseline and stable Postop Assessment: no apparent nausea or vomiting Anesthetic complications: no    Last Vitals:  Vitals:   12/30/17 1415 12/30/17 1420  BP: 100/63   Pulse: 84   Resp: 17   Temp:  (!) 36.3 C  SpO2: 96%     Last Pain:  Vitals:   12/30/17 1420  TempSrc:   PainSc: 0-No pain                 Ryan P Ellender

## 2018-01-01 ENCOUNTER — Telehealth: Payer: Self-pay | Admitting: Vascular Surgery

## 2018-01-01 NOTE — Telephone Encounter (Signed)
sch appt lvm 02/11/18 1pm p/o PA

## 2018-01-10 ENCOUNTER — Ambulatory Visit: Payer: BLUE CROSS/BLUE SHIELD | Attending: Nurse Practitioner | Admitting: Nurse Practitioner

## 2018-01-10 ENCOUNTER — Encounter: Payer: Self-pay | Admitting: Nurse Practitioner

## 2018-01-10 VITALS — BP 123/79 | HR 78 | Temp 99.1°F | Ht 73.0 in | Wt 279.4 lb

## 2018-01-10 DIAGNOSIS — N185 Chronic kidney disease, stage 5: Secondary | ICD-10-CM | POA: Insufficient documentation

## 2018-01-10 DIAGNOSIS — Z8673 Personal history of transient ischemic attack (TIA), and cerebral infarction without residual deficits: Secondary | ICD-10-CM | POA: Diagnosis not present

## 2018-01-10 DIAGNOSIS — I120 Hypertensive chronic kidney disease with stage 5 chronic kidney disease or end stage renal disease: Secondary | ICD-10-CM | POA: Insufficient documentation

## 2018-01-10 DIAGNOSIS — Z79899 Other long term (current) drug therapy: Secondary | ICD-10-CM | POA: Insufficient documentation

## 2018-01-10 DIAGNOSIS — M329 Systemic lupus erythematosus, unspecified: Secondary | ICD-10-CM | POA: Insufficient documentation

## 2018-01-10 DIAGNOSIS — Z760 Encounter for issue of repeat prescription: Secondary | ICD-10-CM | POA: Diagnosis not present

## 2018-01-10 DIAGNOSIS — Z833 Family history of diabetes mellitus: Secondary | ICD-10-CM | POA: Diagnosis not present

## 2018-01-10 DIAGNOSIS — Z9889 Other specified postprocedural states: Secondary | ICD-10-CM | POA: Insufficient documentation

## 2018-01-10 DIAGNOSIS — D696 Thrombocytopenia, unspecified: Secondary | ICD-10-CM

## 2018-01-10 DIAGNOSIS — I10 Essential (primary) hypertension: Secondary | ICD-10-CM

## 2018-01-10 DIAGNOSIS — Z79891 Long term (current) use of opiate analgesic: Secondary | ICD-10-CM | POA: Insufficient documentation

## 2018-01-10 DIAGNOSIS — Z7901 Long term (current) use of anticoagulants: Secondary | ICD-10-CM | POA: Diagnosis not present

## 2018-01-10 DIAGNOSIS — R569 Unspecified convulsions: Secondary | ICD-10-CM | POA: Diagnosis not present

## 2018-01-10 DIAGNOSIS — R7303 Prediabetes: Secondary | ICD-10-CM | POA: Diagnosis not present

## 2018-01-10 DIAGNOSIS — E782 Mixed hyperlipidemia: Secondary | ICD-10-CM

## 2018-01-10 LAB — POCT GLYCOSYLATED HEMOGLOBIN (HGB A1C): Hemoglobin A1C: 5.3 % (ref 4.0–5.6)

## 2018-01-10 LAB — GLUCOSE, POCT (MANUAL RESULT ENTRY): POC Glucose: 110 mg/dl — AB (ref 70–99)

## 2018-01-10 MED ORDER — ATORVASTATIN CALCIUM 40 MG PO TABS: 40.0000 mg | ORAL_TABLET | Freq: Every day | ORAL | 3 refills | Status: DC

## 2018-01-10 MED ORDER — LAMOTRIGINE ER 300 MG PO TB24: 300.0000 mg | ORAL_TABLET | Freq: Every day | ORAL | 6 refills | Status: DC

## 2018-01-10 MED ORDER — AMLODIPINE BESYLATE 10 MG PO TABS: 10.0000 mg | ORAL_TABLET | Freq: Every day | ORAL | 3 refills | Status: DC

## 2018-01-10 MED FILL — AMLODIPINE BESYLATE 10 MG T: 10 | 90 days supply | Qty: 90 | Fill #4

## 2018-01-10 MED FILL — lamoTRIgine ER 300 MG TB24: 300 | 90 days supply | Qty: 90 | Fill #1

## 2018-01-10 MED FILL — ATORVASTATIN 40 MG TABLET: 40 | 90 days supply | Qty: 90 | Fill #0

## 2018-01-10 NOTE — Patient Instructions (Signed)
Chronic Kidney Disease, Adult Chronic kidney disease (CKD) happens when the kidneys are damaged during a time of 3 or more months. The kidneys are two organs that do many important jobs in the body. These jobs include:  Removing wastes and extra fluids from the blood.  Making hormones that maintain the amount of fluid in your tissues and blood vessels.  Making sure that the body has the right amount of fluids and chemicals.  Most of the time, this condition does not go away, but it can usually be controlled. Steps must be taken to slow down the kidney damage or stop it from getting worse. Otherwise, the kidneys may stop working. Follow these instructions at home:  Follow your diet as told by your doctor. You may need to avoid alcohol, salty foods (sodium), and foods that are high in potassium, calcium, and protein.  Take over-the-counter and prescription medicines only as told by your doctor. Do not take any new medicines unless your doctor says you can do that. These include vitamins and minerals. ? Medicines and nutritional supplements can make kidney damage worse. ? Your doctor may need to change how much medicine you take.  Do not use any tobacco products. These include cigarettes, chewing tobacco, and e-cigarettes. If you need help quitting, ask your doctor.  Keep all follow-up visits as told by your doctor. This is important.  Check your blood pressure. Tell your doctor if there are changes to your blood pressure.  Get to a healthy weight. Stay at that weight. If you need help with this, ask your doctor.  Start or continue an exercise plan. Try to exercise at least 30 minutes a day, 5 days a week.  Stay up-to-date with your shots (immunizations) as told by your doctor. Contact a doctor if:  Your symptoms get worse.  You have new symptoms. Get help right away if:  You have symptoms of end-stage kidney disease. These include: ? Headaches. ? Skin that is darker or lighter  than normal. ? Numbness in your hands or feet. ? Easy bruising. ? Having hiccups often. ? Chest pain. ? Shortness of breath. ? Stopping of menstrual periods in women.  You have a fever.  You are making very little pee (urine).  You have pain or bleeding when you pee (urinate). This information is not intended to replace advice given to you by your health care provider. Make sure you discuss any questions you have with your health care provider. Document Released: 08/29/2009 Document Revised: 11/10/2015 Document Reviewed: 02/01/2012 Elsevier Interactive Patient Education  2017 [place] for Chronic Kidney Disease When your kidneys are not working well, they cannot remove waste and excess substances from your blood as effectively as they did before. This can lead to a buildup and imbalance of these substances, which can worsen kidney damage and affect how your body functions. Certain foods lead to a buildup of these substances in the body. By changing your diet as recommended by your diet and nutrition specialist (dietitian) or health care provider, you could help prevent further kidney damage and delay or prevent the need for dialysis. What are tips for following this plan? General instructions  Work with your health care provider and dietitian to develop a meal plan that is right for you. Foods you can eat, limit, or avoid will be different for each person depending on the stage of kidney disease and any other existing health conditions.  Talk with your health care provider about whether  you should take a vitamin and mineral supplement.  Use standard measuring cups and spoons to measure servings of foods. Use a kitchen scale to measure portions of protein foods.  If directed by your health care provider, avoid drinking too much fluid. Measure and count all liquids, including water, ice, soups, flavored gelatin, and frozen desserts such as popsicles or ice cream. Reading  food labels  Check the amount of sodium in foods. Choose foods that have less than 300 milligrams (mg) per serving.  Check the ingredient list for phosphorus or potassium-based additives or preservatives.  Check the amount of saturated and trans fat. Limit or avoid these fats as told by your dietitian. Shopping  Avoid buying foods that are: ? Processed, frozen, or prepackaged. ? Calcium-enriched or fortified.  Do not buy foods that have salt or sodium listed among the first five ingredients.  Do not buy canned vegetables. Cooking  Replace animal proteins, such as meat, fish, eggs, or dairy, with plant proteins from beans, nuts, and soy. ? Use soy milk instead of cow's milk. ? Add beans or tofu to soups, casseroles, or pasta dishes instead of meat.  Soak vegetables, such as potatoes, before cooking to reduce potassium. To do this: ? Peel and cut into small pieces. ? Soak in warm water for at least 2 hours. For every 1 cup of vegetables, use 10 cups of water. ? Drain and rinse with warm water. ? Boil for at least 5 minutes. Meal planning  Limit the amount of protein from plant and animal sources you eat each day.  Do not add salt to food when cooking or before eating.  Eat meals and snacks at around the same time each day. If you have diabetes:  If you have diabetes (diabetes mellitus) and chronic kidney disease, it is important to keep your blood glucose in the target range recommended by your health care provider. Follow your diabetes management plan. This may include: ? Checking your blood glucose regularly. ? Taking oral medicines, insulin, or both. ? Exercising for at least 30 minutes on 5 or more days each week, or as told by your health care provider. ? Tracking how many servings of carbohydrates you eat at each meal.  You may be given specific guidelines on how much of certain foods and nutrients you may eat, depending on your stage of kidney disease and whether you  have high blood pressure (hypertension). Follow your meal plan as told by your dietitian. What nutrients should be limited? The items listed are not a complete list. Talk with your dietitian about what dietary choices are best for you. Potassium Potassium affects how steadily your heart beats. If too much potassium builds up in your blood, it can cause an irregular heartbeat or even a heart attack. You may need to eat less potassium, depending on your blood potassium levels and the stage of kidney disease. Talk to your dietitian about how much potassium you may have each day. You may need to limit or avoid foods that are high in potassium, such as:  Milk and soy milk.  Fruits, such as bananas, papaya, apricots, nectarines, melon, prunes, raisins, kiwi, and oranges.  Vegetables, such as potatoes, sweet potatoes, yams, tomatoes, leafy greens, beets, okra, avocado, pumpkin, and winter squash.  White and lima beans.  Phosphorus Phosphorus is a mineral found in your bones. A balance between calcium and phosphorous is needed to build and maintain healthy bones. Too much phosphorus pulls calcium from your bones. This  can make your bones weak and more likely to break. Too much phosphorus can also make your skin itch. You may need to eat less phosphorus depending on your blood phosphorus levels and the stage of kidney disease. Talk to your dietitian about how much potassium you may have each day. You may need to take medicine to lower your blood phosphorus levels if diet changes do not help. You may need to limit or avoid foods that are high in phosphorus, such as:  Milk and dairy products.  Dried beans and peas.  Tofu, soy milk, and other soy-based meat replacements.  Colas.  Nuts and peanut butter.  Meat, poultry, and fish.  Bran cereals and oatmeals.  Protein Protein helps you to make and keep muscle. It also helps in the repair of your body's cells and tissues. One of the natural  breakdown products of protein is a waste product called urea. When your kidneys are not working properly, they cannot remove wastes, such as urea, like they did before you developed chronic kidney disease. Reducing how much protein you eat can help prevent a buildup of urea in your blood. Depending on your stage of kidney disease, you may need to limit foods that are high in protein. Sources of animal protein include:  Meat (all types).  Fish and seafood.  Poultry.  Eggs.  Dairy.  Other protein foods include:  Beans and legumes.  Nuts and nut butter.  Soy and tofu.  Sodium Sodium, which is found in salt, helps maintain a healthy balance of fluids in your body. Too much sodium can increase your blood pressure and have a negative effect on the function of your heart and lungs. Too much sodium can also cause your body to retain too much fluid, making your kidneys work harder. Most people should have less than 2,300 milligrams (mg) of sodium each day. If you have hypertension, you may need to limit your sodium to 1,500 mg each day. Talk to your dietitian about how much sodium you may have each day. You may need to limit or avoid foods that are high in sodium, such as:  Salt seasonings.  Soy sauce.  Cured and processed meats.  Salted crackers and snack foods.  Fast food.  Canned soups and most canned foods.  Pickled foods.  Vegetable juice.  Boxed mixes or ready-to-eat boxed meals and side dishes.  Bottled dressings, sauces, and marinades.  Summary  Chronic kidney disease can lead to a buildup and imbalance of waste and excess substances in the body. Certain foods lead to a buildup of these substances. By adjusting your intake of these foods, you could help prevent more kidney damage and delay or prevent the need for dialysis.  Food adjustments are different for each person with chronic kidney disease. Work with a dietitian to set up nutrient goals and a meal plan that is  right for you.  If you have diabetes and chronic kidney disease, it is important to keep your blood glucose in the target range recommended by your health care provider. This information is not intended to replace advice given to you by your health care provider. Make sure you discuss any questions you have with your health care provider. Document Released: 08/25/2002 Document Revised: 05/30/2016 Document Reviewed: 05/30/2016 Elsevier Interactive Patient Education  [employer].

## 2018-01-10 NOTE — Progress Notes (Signed)
Assessment & Plan:  [name] [name] was seen today for establish care and medication refill.  Diagnoses and all orders for this visit:  Prediabetes -     Glucose (CBG) -     HgB A1c Continue blood sugar control as discussed in office today, low carbohydrate diet, and regular physical exercise as tolerated, 150 minutes per week (30 min each day, 5 days per week, or 50 min 3 days per week).  Annual eye exams and foot exams are recommended.   Seizures (HCC) -     LamoTRIgine (LAMICTAL XR) 300 MG TB24 24 hour tablet; Take 1 tablet (300 mg total) by mouth daily.  Essential hypertension -     amLODipine (NORVASC) 10 MG tablet; Take 1 tablet (10 mg total) by mouth daily. Continue all antihypertensives as prescribed.  Remember to bring in your blood pressure log with you for your follow up appointment.  DASH/Mediterranean Diets are healthier choices for HTN.   Mixed hyperlipidemia -     atorvastatin (LIPITOR) 40 MG tablet; Take 1 tablet (40 mg total) by mouth daily. INSTRUCTIONS: Work on a low fat, heart healthy diet and participate in regular aerobic exercise program by working out at least 150 minutes per week; 5 days a week-30 minutes per day. Avoid red meat, fried foods. junk foods, sodas, sugary drinks, unhealthy snacking, alcohol and smoking.  Drink at least 48oz of water per day and monitor your carbohydrate intake daily.   Chronic renal failure, stage 5 (HCC) Continue follow up with Nephrology as instructed  Patient has been counseled on age-appropriate routine health concerns for screening and prevention. These are reviewed and up-to-date. Referrals have been placed accordingly. Immunizations are up-to-date or declined.    Subjective:   Chief Complaint  Patient presents with  . Establish Care    Patient is here to establish care for hypertension.   . Medication Refill   HPI [name] [name] 37 y.o. male presents to office today to establish care. He has a history of CKD, Seizures (last  seizue activity 3-4 years ago), prediabetes, HTN and HPL. He has CKD stage 5 and will see [facility] on August 1st 2019 @1pm .  He has a Left AC AVF which was placed on 12-30-2017. He has thrombocytopenia which could likely be related to his CKD. WIll continue to monitor and refer to hematology if needed. He has a history of stroke. Taking Eliquis however he states he assumed he was supposed to take it once a day like his previous coumadin. I instructed him to resume his eliquis at the same dose however he needs to take it twice a day and not once a day. He verbalized understanding.   Seizures Chronic. Stable. Controlled with lamictal. Endorses medication compliance.  CHRONIC HYPERTENSION Disease Monitoring  Blood pressure range BP Readings from Last 3 Encounters:  01/10/18 123/79  12/30/17 100/63  12/26/17 140/90    Chest pain: no   Dyspnea: no   Claudication: no  Medication compliance: yes, taking HCTZ 25mg , Carvedilol 3.125mg  BID, amlodipine 10mg   daily  Medication Side Effects  Lightheadedness: no   Urinary frequency: no   Edema: no   Impotence: yes  Preventitive Healthcare:  Exercise: no   Diet Pattern: diet: general  Salt Restriction:  no  Hyperlipidemia Patient presents for follow up to hyperlipidemia.  He is not medication compliant. He is not diet compliant and denies skin xanthelasma or statin intolerance including myalgias. LDL not at goal. Fasting lipid panel pending. Atorvastatin filled today.  Lab Results  Component Value Date   CHOL 221 (H) 12/10/2016   Lab Results  Component Value Date   HDL 45 12/10/2016   Lab Results  Component Value Date   LDLCALC 149 (H) 12/10/2016   Lab Results  Component Value Date   TRIG 135 12/10/2016   Lab Results  Component Value Date   CHOLHDL 4.9 12/10/2016    Review of Systems  Constitutional: Negative for fever, malaise/fatigue and weight loss.  HENT: Negative.  Negative for nosebleeds.   Eyes:  Negative.  Negative for blurred vision, double vision and photophobia.  Respiratory: Negative.  Negative for cough and shortness of breath.   Cardiovascular: Negative.  Negative for chest pain, palpitations and leg swelling.  Gastrointestinal: Negative.  Negative for heartburn, nausea and vomiting.  Genitourinary:       Erectile Dysfunction  Musculoskeletal: Negative.  Negative for myalgias.  Neurological: Positive for tingling. Negative for dizziness, focal weakness, seizures and headaches.  Psychiatric/Behavioral: Negative.  Negative for suicidal ideas.     Past Medical History:  Diagnosis Date  . Chronic kidney disease   . Hypertension   . Lupus ([place])   . Seizures ([place])   . Seizures ([place])   . Stroke ([place])   . Stroke [facility])    89 or 37 years of age.      Past Surgical History:  Procedure Laterality Date  . AV FISTULA PLACEMENT Left 12/30/2017   Procedure: ARTERIOVENOUS (AV) FISTULA CREATION VERSUS INSERTION OF ARTERIOVENOUS GRAFT LEFT ARM;  Surgeon: Rosetta Posner, MD;  Location: [facility];  Service: Vascular;  Laterality: Left;  . RENAL BIOPSY      Family History  Problem Relation Age of Onset  . Diabetes Mother   . Diabetes Maternal Grandmother     Social History Reviewed with no changes to be made today.   Outpatient Medications Prior to Visit  Medication Sig Dispense Refill  . albuterol (PROVENTIL HFA;VENTOLIN HFA) 108 (90 Base) MCG/ACT inhaler Inhale 1-2 puffs into the lungs every 4 (four) hours as needed for wheezing or shortness of breath (or cough). 1 Inhaler 0  . apixaban (ELIQUIS) 5 MG TABS tablet Take 1 tablet (5 mg total) by mouth 2 (two) times daily. 60 tablet 1  . calcitRIOL (ROCALTROL) 0.25 MCG capsule Take 1 capsule (0.25 mcg total) by mouth daily. 30 capsule 2  . calcium acetate (PHOSLO) 667 MG capsule Take 2 capsules (1,334 mg total) by mouth 3 (three) times daily with meals. Take extra 1 capsule with snacks 90 capsule 3  . carvedilol (COREG) 3.125 MG tablet  Take 1 tablet (3.125 mg total) by mouth 2 (two) times daily with a meal. 60 tablet 1  . hydrochlorothiazide (HYDRODIURIL) 25 MG tablet Take 25 mg by mouth daily.    Marland Kitchen amLODipine (NORVASC) 10 MG tablet Take 1 tablet (10 mg total) by mouth daily. 90 tablet 3  . LamoTRIgine (LAMICTAL XR) 300 MG TB24 24 hour tablet Take 1 tablet (300 mg total) by mouth daily. 30 tablet 5  . traMADol (ULTRAM) 50 MG tablet Take 1 tablet (50 mg total) by mouth every 6 (six) hours as needed. (Patient not taking: Reported on 01/10/2018) 20 tablet 0  . atorvastatin (LIPITOR) 40 MG tablet Take 1 tablet (40 mg total) by mouth daily. (Patient not taking: Reported on 06/29/2017) 30 tablet 2   No facility-administered medications prior to visit.     Allergies  Allergen Reactions  . Zyrtec [Cetirizine]     Facial swelling  Objective:    BP 123/79 (BP Location: Right Arm, Patient Position: Sitting, Cuff Size: Large)   Pulse 78   Temp 99.1 F (37.3 C) (Oral)   Ht 6\' 1"  (1.854 m)   Wt 279 lb 6.4 oz (126.7 kg)   SpO2 98%   BMI 36.86 kg/m  Wt Readings from Last 3 Encounters:  01/10/18 279 lb 6.4 oz (126.7 kg)  12/30/17 268 lb 4.8 oz (121.7 kg)  12/26/17 268 lb 4.8 oz (121.7 kg)    Physical Exam  Constitutional: He is oriented to person, place, and time. He appears well-developed and well-nourished. He is cooperative.  HENT:  Head: Normocephalic and atraumatic.  Eyes: EOM are normal.  Neck: Normal range of motion.  Cardiovascular: Normal rate, regular rhythm and normal heart sounds. Exam reveals no gallop and no friction rub.  No murmur heard. Pulmonary/Chest: Effort normal and breath sounds normal. No tachypnea. No respiratory distress. He has no decreased breath sounds. He has no wheezes. He has no rhonchi. He has no rales. He exhibits no tenderness.  Abdominal: Bowel sounds are normal.  Musculoskeletal: Normal range of motion. He exhibits no edema.       Arms: Neurological: He is alert and oriented to  person, place, and time. Coordination normal.  Skin: Skin is warm and dry.  Psychiatric: He has a normal mood and affect. His behavior is normal. Judgment and thought content normal.  Nursing note and vitals reviewed.        Patient has been counseled extensively about nutrition and exercise as well as the importance of adherence with medications and regular follow-up. The patient was given clear instructions to go to ER or return to medical center if symptoms don't improve, worsen or new problems develop. The patient verbalized understanding.   Follow-up: Return in about 2 months (around 03/13/2018) for HTN.   Gildardo Pounds, FNP-BC [facility] and [place], [place]   01/10/2018, 5:34 PM

## 2018-01-15 ENCOUNTER — Encounter (HOSPITAL_COMMUNITY): Payer: Self-pay | Admitting: Obstetrics and Gynecology

## 2018-01-15 ENCOUNTER — Emergency Department (HOSPITAL_COMMUNITY)
Admission: EM | Admit: 2018-01-15 | Discharge: 2018-01-15 | Disposition: A | Discharge status: Home / Self Care | Payer: BLUE CROSS/BLUE SHIELD | Attending: Emergency Medicine | Admitting: Emergency Medicine

## 2018-01-15 ENCOUNTER — Other Ambulatory Visit: Payer: Self-pay

## 2018-01-15 DIAGNOSIS — T7840XA Allergy, unspecified, initial encounter: Secondary | ICD-10-CM | POA: Insufficient documentation

## 2018-01-15 DIAGNOSIS — Z87891 Personal history of nicotine dependence: Secondary | ICD-10-CM | POA: Diagnosis not present

## 2018-01-15 DIAGNOSIS — J302 Other seasonal allergic rhinitis: Secondary | ICD-10-CM | POA: Diagnosis not present

## 2018-01-15 DIAGNOSIS — I129 Hypertensive chronic kidney disease with stage 1 through stage 4 chronic kidney disease, or unspecified chronic kidney disease: Secondary | ICD-10-CM | POA: Diagnosis not present

## 2018-01-15 DIAGNOSIS — Z8673 Personal history of transient ischemic attack (TIA), and cerebral infarction without residual deficits: Secondary | ICD-10-CM | POA: Diagnosis not present

## 2018-01-15 DIAGNOSIS — Z9109 Other allergy status, other than to drugs and biological substances: Secondary | ICD-10-CM

## 2018-01-15 DIAGNOSIS — R040 Epistaxis: Secondary | ICD-10-CM | POA: Diagnosis not present

## 2018-01-15 DIAGNOSIS — Z7901 Long term (current) use of anticoagulants: Secondary | ICD-10-CM | POA: Insufficient documentation

## 2018-01-15 DIAGNOSIS — Z79899 Other long term (current) drug therapy: Secondary | ICD-10-CM | POA: Diagnosis not present

## 2018-01-15 DIAGNOSIS — N183 Chronic kidney disease, stage 3 (moderate): Secondary | ICD-10-CM | POA: Diagnosis not present

## 2018-01-15 MED ORDER — SILVER NITRATE-POT NITRATE 75-25 % EX MISC
1.0000 | Freq: Once | CUTANEOUS | Status: AC
  Administered 2018-01-15: 1 via TOPICAL
  Filled 2018-01-15: qty 1

## 2018-01-15 NOTE — ED Provider Notes (Signed)
[facility] DEPT Provider Note   CSN: [id number] Arrival date & time: 01/15/18  1651     History   Chief Complaint Chief Complaint  Patient presents with  . Epistaxis    HPI [name] [name] is a 37 y.o. male.  HPI   He presents for evaluation of nosebleeding which started today.  He is also being having nasal congestion associated with allergies for 2 days.  He denies weakness, dizziness, nausea or vomiting.  He is due to see his nephrologist tomorrow to follow-up on his kidney disease.  He takes Eliquis, apparently because of a history of stroke.  He had a left arm AV fistula placed about 2 weeks ago.  He denies fever, chills, nausea, vomiting, focal weakness or paresthesia.  There are no other known modifying factors.  Past Medical History:  Diagnosis Date  . Chronic kidney disease   . Hypertension   . Lupus ([place])   . Seizures ([place])   . Seizures ([place])   . Stroke ([place])   . Stroke [facility])    53 or 37 years of age.      Patient Active Problem List   Diagnosis Date Noted  . Prediabetes 01/10/2018  . ARF (acute renal failure) ([place]) 12/21/2017  . Chest pain in adult   . CKD (chronic kidney disease) stage 3, GFR 30-59 ml/min (HCC) 10/24/2015  . Seizures ([place]) 10/24/2015  . HTN (hypertension) 01/12/2015  . Chronic anticoagulation 04/24/2011  . Lupus anticoagulant positive 04/24/2011  . OBESITY, NOS 08/15/2006  . THROMBOCYTOPENIA 08/15/2006  . TOBACCO DEPENDENCE 08/15/2006  . CVA 08/15/2006    Past Surgical History:  Procedure Laterality Date  . AV FISTULA PLACEMENT Left 12/30/2017   Procedure: ARTERIOVENOUS (AV) FISTULA CREATION VERSUS INSERTION OF ARTERIOVENOUS GRAFT LEFT ARM;  Surgeon: Rosetta Posner, MD;  Location: [facility];  Service: Vascular;  Laterality: Left;  . RENAL BIOPSY          Home Medications    Prior to Admission medications   Medication Sig Start Date End Date Taking? Authorizing Provider  albuterol (PROVENTIL  HFA;VENTOLIN HFA) 108 (90 Base) MCG/ACT inhaler Inhale 1-2 puffs into the lungs every 4 (four) hours as needed for wheezing or shortness of breath (or cough). 06/29/17   Street, Mercedes, PA-C  amLODipine (NORVASC) 10 MG tablet Take 1 tablet (10 mg total) by mouth daily. 01/10/18   Gildardo Pounds, NP  apixaban (ELIQUIS) 5 MG TABS tablet Take 1 tablet (5 mg total) by mouth 2 (two) times daily. 12/26/17   Oswald Hillock, MD  atorvastatin (LIPITOR) 40 MG tablet Take 1 tablet (40 mg total) by mouth daily. 01/10/18   Gildardo Pounds, NP  calcitRIOL (ROCALTROL) 0.25 MCG capsule Take 1 capsule (0.25 mcg total) by mouth daily. 12/27/17   Oswald Hillock, MD  calcium acetate (PHOSLO) 667 MG capsule Take 2 capsules (1,334 mg total) by mouth 3 (three) times daily with meals. Take extra 1 capsule with snacks 12/26/17   Oswald Hillock, MD  carvedilol (COREG) 3.125 MG tablet Take 1 tablet (3.125 mg total) by mouth 2 (two) times daily with a meal. 12/26/17   Oswald Hillock, MD  hydrochlorothiazide (HYDRODIURIL) 25 MG tablet Take 25 mg by mouth daily.    [provider]  LamoTRIgine (LAMICTAL XR) 300 MG TB24 24 hour tablet Take 1 tablet (300 mg total) by mouth daily. 01/10/18   Gildardo Pounds, NP  traMADol (ULTRAM) 50 MG tablet Take 1 tablet (50  mg total) by mouth every 6 (six) hours as needed. Patient not taking: Reported on 01/10/2018 12/30/17   EarlyArvilla Meres, MD    Family History Family History  Problem Relation Age of Onset  . Diabetes Mother   . Diabetes Maternal Grandmother     Social History Social History   Tobacco Use  . Smoking status: Former Smoker    Packs/day: 0.25    Types: Cigarettes    Last attempt to quit: 12/21/2017    Years since quitting: 0.0  . Smokeless tobacco: Never Used  Substance Use Topics  . Alcohol use: Not Currently  . Drug use: No     Allergies   Zyrtec [cetirizine]   Review of Systems Review of Systems  All other systems reviewed and are  negative.    Physical Exam Updated Vital Signs BP 124/80 (BP Location: Right Arm)   Pulse 76   Temp 98.5 F (36.9 C) (Oral)   Resp 16   Ht 6\' 1"  (1.854 m)   Wt 124.3 kg (274 lb)   SpO2 100%   BMI 36.15 kg/m   Physical Exam  Constitutional: He is oriented to person, place, and time. He appears well-developed and well-nourished. No distress.  HENT:  Head: Normocephalic and atraumatic.  Right Ear: External ear normal.  Left Ear: External ear normal.  Dried blood right nares.  Right medial septum with localized blood on the septal wall, with an area in the center which appears to be a blood vessel, at the service, which is likely the source of bleeding.  Nasal turbinates are swollen, and he has a nasal sounding voice, consistent with nasal congestion.  Eyes: Pupils are equal, round, and reactive to light. Conjunctivae and EOM are normal.  Neck: Normal range of motion and phonation normal. Neck supple.  Cardiovascular: Normal rate.  Pulmonary/Chest: Effort normal. He exhibits no bony tenderness.  Musculoskeletal: Normal range of motion.  Neurological: He is alert and oriented to person, place, and time. No cranial nerve deficit or sensory deficit. He exhibits normal muscle tone. Coordination normal.  Skin: Skin is warm, dry and intact.  Psychiatric: He has a normal mood and affect. His behavior is normal. Judgment and thought content normal.  Nursing note and vitals reviewed.    ED Treatments / Results  Labs (all labs ordered are listed, but only abnormal results are displayed) Labs Reviewed - No data to display  EKG None  Radiology No results found.  Procedures .Epistaxis Management Date/Time: 01/15/2018 6:01 PM Performed by: Daleen Bo, MD Authorized by: Daleen Bo, MD   Consent:    Consent obtained:  Verbal   Consent given by:  Patient Anesthesia (see MAR for exact dosages):    Anesthesia method:  None Procedure details:    Treatment site:  R anterior    Treatment method:  Silver nitrate   Treatment complexity:  Limited   Treatment episode: initial   Post-procedure details:    Assessment:  Bleeding stopped   Patient tolerance of procedure:  Tolerated well, no immediate complications   (including critical care time)  Medications Ordered in ED Medications  silver nitrate applicators applicator 1 Stick (1 Stick Topical Given 01/15/18 1749)     Initial Impression / Assessment and Plan / ED Course  I have reviewed the triage vital signs and the nursing notes.  Pertinent labs & imaging results that were available during my care of the patient were reviewed by me and considered in my medical decision making (see chart  for details).      Patient Vitals for the past 24 hrs:  BP Temp Temp src Pulse Resp SpO2 Height Weight  01/15/18 1703 124/80 98.5 F (36.9 C) Oral 76 16 100 % 6\' 1"  (1.854 m) 124.3 kg (274 lb)    5:54 PM Reevaluation with update and discussion. After initial assessment and treatment, an updated evaluation reveals bleeding controlled, no further complaints.  Findings discussed with patient all questions answered. Daleen Bo   Medical Decision Making: Epistaxis, related to seasonal allergies.  Doubt significant blood loss, metabolic instability or impending vascular collapse.  CRITICAL CARE-no Performed by: Daleen Bo   Nursing Notes Reviewed/ Care Coordinated Applicable Imaging Reviewed Interpretation of Laboratory Data incorporated into ED treatment  The patient appears reasonably screened and/or stabilized for discharge and I doubt any other medical condition or other [facility] requiring further screening, evaluation, or treatment in the ED at this time prior to discharge.  Plan: Home Medications-continue usual medications; Home Treatments-nasal care for nosebleed as outlined on discharge instructions; return here if the recommended treatment, does not improve the symptoms; Recommended follow up-PCP 1 week checkup as  needed.  See renal tomorrow as scheduled for reevaluation     Final Clinical Impressions(s) / ED Diagnoses   Final diagnoses:  Epistaxis  Anterior epistaxis  Allergy to environmental factors    ED Discharge Orders    None       Daleen Bo, MD 01/15/18 [phone]

## 2018-01-15 NOTE — ED Triage Notes (Signed)
Pt reports he had a nosebleed today x2. Bleeding is controlled at this time. Pt reports he has HTN and was late taking his medicine today. Pt reports it was only his right nostril bleeding.

## 2018-01-15 NOTE — ED Provider Notes (Signed)
[facility] DEPT Provider Note   CSN: [id number] Arrival date & time: 01/15/18  1651     History   Chief Complaint Chief Complaint  Patient presents with  . Epistaxis    HPI [name] [name] is a 37 y.o. male.  37 year old male the past medical history of CKD, seizures, hypertension, lupus, CVA presents to the ED with a chief complaint of epistaxis since this morning.  Patient states the bleeding started at work 7 AM this morning after blowing his nose.  Patient continued his work day and then the bleeding returned around 3 PM and it was not stopping.  She reports clots coming out of his right nostril.  Patient is currently on Xarelto.  He was recently discharged from the hospital, and received a dialysis fistula which he is supposed to start treatment this week.  Patient denies any headache, chest pain, shortness of breath or other complaints at this time     Past Medical History:  Diagnosis Date  . Chronic kidney disease   . Hypertension   . Lupus ([place])   . Seizures ([place])   . Seizures ([place])   . Stroke ([place])   . Stroke [facility])    62 or 37 years of age.      Patient Active Problem List   Diagnosis Date Noted  . Prediabetes 01/10/2018  . ARF (acute renal failure) ([place]) 12/21/2017  . Chest pain in adult   . CKD (chronic kidney disease) stage 3, GFR 30-59 ml/min (HCC) 10/24/2015  . Seizures ([place]) 10/24/2015  . HTN (hypertension) 01/12/2015  . Chronic anticoagulation 04/24/2011  . Lupus anticoagulant positive 04/24/2011  . OBESITY, NOS 08/15/2006  . THROMBOCYTOPENIA 08/15/2006  . TOBACCO DEPENDENCE 08/15/2006  . CVA 08/15/2006    Past Surgical History:  Procedure Laterality Date  . AV FISTULA PLACEMENT Left 12/30/2017   Procedure: ARTERIOVENOUS (AV) FISTULA CREATION VERSUS INSERTION OF ARTERIOVENOUS GRAFT LEFT ARM;  Surgeon: Rosetta Posner, MD;  Location: [facility];  Service: Vascular;  Laterality: Left;  . RENAL BIOPSY          Home  Medications    Prior to Admission medications   Medication Sig Start Date End Date Taking? Authorizing Provider  albuterol (PROVENTIL HFA;VENTOLIN HFA) 108 (90 Base) MCG/ACT inhaler Inhale 1-2 puffs into the lungs every 4 (four) hours as needed for wheezing or shortness of breath (or cough). 06/29/17   Street, Mercedes, PA-C  amLODipine (NORVASC) 10 MG tablet Take 1 tablet (10 mg total) by mouth daily. 01/10/18   Gildardo Pounds, NP  apixaban (ELIQUIS) 5 MG TABS tablet Take 1 tablet (5 mg total) by mouth 2 (two) times daily. 12/26/17   Oswald Hillock, MD  atorvastatin (LIPITOR) 40 MG tablet Take 1 tablet (40 mg total) by mouth daily. 01/10/18   Gildardo Pounds, NP  calcitRIOL (ROCALTROL) 0.25 MCG capsule Take 1 capsule (0.25 mcg total) by mouth daily. 12/27/17   Oswald Hillock, MD  calcium acetate (PHOSLO) 667 MG capsule Take 2 capsules (1,334 mg total) by mouth 3 (three) times daily with meals. Take extra 1 capsule with snacks 12/26/17   Oswald Hillock, MD  carvedilol (COREG) 3.125 MG tablet Take 1 tablet (3.125 mg total) by mouth 2 (two) times daily with a meal. 12/26/17   Oswald Hillock, MD  hydrochlorothiazide (HYDRODIURIL) 25 MG tablet Take 25 mg by mouth daily.    [provider]  LamoTRIgine (LAMICTAL XR) 300 MG TB24 24 hour tablet Take  1 tablet (300 mg total) by mouth daily. 01/10/18   Gildardo Pounds, NP  traMADol (ULTRAM) 50 MG tablet Take 1 tablet (50 mg total) by mouth every 6 (six) hours as needed. Patient not taking: Reported on 01/10/2018 12/30/17   EarlyArvilla Meres, MD    Family History Family History  Problem Relation Age of Onset  . Diabetes Mother   . Diabetes Maternal Grandmother     Social History Social History   Tobacco Use  . Smoking status: Former Smoker    Packs/day: 0.25    Types: Cigarettes    Last attempt to quit: 12/21/2017    Years since quitting: 0.0  . Smokeless tobacco: Never Used  Substance Use Topics  . Alcohol use: Not Currently  . Drug use: No      Allergies   Zyrtec [cetirizine]   Review of Systems Review of Systems  Constitutional: Negative for chills and fever.  HENT: Positive for nosebleeds (From his right nostril only). Negative for ear pain and sore throat.   Eyes: Negative for pain and visual disturbance.  Respiratory: Negative for cough and shortness of breath.   Cardiovascular: Negative for chest pain and palpitations.  Gastrointestinal: Negative for abdominal pain and vomiting.  Genitourinary: Negative for dysuria and hematuria.  Musculoskeletal: Negative for arthralgias and back pain.  Skin: Negative for color change and rash.  Neurological: Negative for seizures and syncope.  All other systems reviewed and are negative.    Physical Exam Updated Vital Signs BP 124/80 (BP Location: Right Arm)   Pulse 76   Temp 98.5 F (36.9 C) (Oral)   Resp 16   Ht 6\' 1"  (1.854 m)   Wt 124.3 kg (274 lb)   SpO2 100%   BMI 36.15 kg/m   Physical Exam  Constitutional: He is oriented to person, place, and time. He appears well-developed and well-nourished.  HENT:  Head: Normocephalic and atraumatic.  Nose: Epistaxis is observed.  Mouth/Throat: Oropharynx is clear and moist.  Bleeding from superficial vessel located on top of septum cauterized with silver nitrate.  Eyes: Pupils are equal, round, and reactive to light. No scleral icterus.  Neck: Normal range of motion.  Cardiovascular: Normal heart sounds.  Pulmonary/Chest: Effort normal and breath sounds normal. He has no wheezes. He exhibits no tenderness.  Abdominal: Soft. Bowel sounds are normal. He exhibits no distension. There is no tenderness.  Musculoskeletal: He exhibits no tenderness or deformity.  Neurological: He is alert and oriented to person, place, and time.  Skin: Skin is warm and dry. Capillary refill takes less than 2 seconds.  Nursing note and vitals reviewed.    ED Treatments / Results  Labs (all labs ordered are listed, but only abnormal  results are displayed) Labs Reviewed - No data to display  EKG None  Radiology No results found.  Procedures Procedures (including critical care time)  Medications Ordered in ED Medications - No data to display   Initial Impression / Assessment and Plan / ED Course  I have reviewed the triage vital signs and the nursing notes.  Pertinent labs & imaging results that were available during my care of the patient were reviewed by me and considered in my medical decision making (see chart for details).     Patient is a 37 year old male with epistaxis.  Upon examination mild bleed visualized on the right nostril frontal septal area, this looks like it is coming from a superficial vessel.  Dr. Eulis Foster used silver nitrate to cauterize the  area and stop the bleeding.  Patient is currently not having any other complaints.  His vitals are stable he is stable for discharge. He is stable for discharge, return precautions provided.  Final Clinical Impressions(s) / ED Diagnoses   Final diagnoses:  Epistaxis    ED Discharge Orders    None       Janeece Fitting, Hershal Coria 01/15/18 1802    Daleen Bo, MD 01/15/18 1806

## 2018-01-15 NOTE — Discharge Instructions (Addendum)
Your nosebleed likely was caused by the allergies.  Your allergies are apparently seasonal meaning that there are factors in the environment which are causing them.  To help them, you can use Flonase nasal spray 1 each nostril twice a day for 1 month.  This is a nasal steroid spray which can decrease symptoms of congestion and runny nose.  Alternatively you can use Claritin or Benadryl which are both antihistamines.  For the nosebleed use Afrin nasal spray 1 each nostril twice a day for 2 or 3 days.  Additionally if you are using the Flonase, and only if you are using the Flonase, you can use Afrin for longer periods of time as needed to help for bleeding or nasal congestion.  Return here for evaluation if you cannot stop the bleeding from a nosebleed, which lasts more than 1 hour.  The best treatment is pinching the nose, with your fingers.  It also helps to use an ice pack over the area of your nose which is bleeding to help decrease the bleeding.

## 2018-01-16 DIAGNOSIS — I120 Hypertensive chronic kidney disease with stage 5 chronic kidney disease or end stage renal disease: Secondary | ICD-10-CM | POA: Diagnosis not present

## 2018-01-16 DIAGNOSIS — N189 Chronic kidney disease, unspecified: Secondary | ICD-10-CM | POA: Diagnosis not present

## 2018-01-16 DIAGNOSIS — D631 Anemia in chronic kidney disease: Secondary | ICD-10-CM | POA: Diagnosis not present

## 2018-01-16 DIAGNOSIS — N2581 Secondary hyperparathyroidism of renal origin: Secondary | ICD-10-CM | POA: Diagnosis not present

## 2018-01-16 DIAGNOSIS — N185 Chronic kidney disease, stage 5: Secondary | ICD-10-CM | POA: Diagnosis not present

## 2018-01-21 MED FILL — CALCITRIOL 0.5 MCG CAPS: 0.5 | 30 days supply | Qty: 30 | Fill #0

## 2018-01-21 MED FILL — VIT D2 1.25 MG (50,000 UNIT: 1.25 MG | 28 days supply | Qty: 4 | Fill #0

## 2018-02-04 MED FILL — CARVEDILOL 3.125 MG TABLET: 3.125 | 30 days supply | Qty: 60 | Fill #1

## 2018-02-04 MED FILL — CALCIUM ACETATE 667 MG CAP: 667 | 21 days supply | Qty: 150 | Fill #1

## 2018-02-06 DIAGNOSIS — N185 Chronic kidney disease, stage 5: Secondary | ICD-10-CM | POA: Diagnosis not present

## 2018-02-11 ENCOUNTER — Encounter: Payer: Self-pay | Admitting: Vascular Surgery

## 2018-02-11 MED FILL — SODIUM BICARB 650 MG TABLET: 650 | 30 days supply | Qty: 120 | Fill #0

## 2018-02-14 DIAGNOSIS — N185 Chronic kidney disease, stage 5: Secondary | ICD-10-CM | POA: Diagnosis not present

## 2018-02-20 MED FILL — ELIQUIS 5 MG TABLET: 5 | 30 days supply | Qty: 60 | Fill #1

## 2018-02-20 MED FILL — CALCITRIOL 0.5 MCG CAPS: 0.5 | 30 days supply | Qty: 30 | Fill #1

## 2018-02-26 ENCOUNTER — Encounter: Payer: Self-pay | Admitting: Vascular Surgery

## 2018-02-26 ENCOUNTER — Encounter: Payer: BLUE CROSS/BLUE SHIELD | Admitting: Physician Assistant

## 2018-03-04 DIAGNOSIS — D631 Anemia in chronic kidney disease: Secondary | ICD-10-CM | POA: Diagnosis not present

## 2018-03-04 DIAGNOSIS — N2581 Secondary hyperparathyroidism of renal origin: Secondary | ICD-10-CM | POA: Diagnosis not present

## 2018-03-04 DIAGNOSIS — N189 Chronic kidney disease, unspecified: Secondary | ICD-10-CM | POA: Diagnosis not present

## 2018-03-04 DIAGNOSIS — I120 Hypertensive chronic kidney disease with stage 5 chronic kidney disease or end stage renal disease: Secondary | ICD-10-CM | POA: Diagnosis not present

## 2018-03-04 DIAGNOSIS — Z23 Encounter for immunization: Secondary | ICD-10-CM | POA: Diagnosis not present

## 2018-03-04 DIAGNOSIS — N185 Chronic kidney disease, stage 5: Secondary | ICD-10-CM | POA: Diagnosis not present

## 2018-03-04 MED FILL — CARVEDILOL 3.125 MG TABLET: 3.125 | 30 days supply | Qty: 60 | Fill #0

## 2018-03-06 MED FILL — CALCITRIOL 0.25 MCG CAPS: 0.25 | 30 days supply | Qty: 30 | Fill #0

## 2018-03-17 ENCOUNTER — Encounter: Payer: Self-pay | Admitting: Nurse Practitioner

## 2018-03-17 ENCOUNTER — Ambulatory Visit: Payer: BLUE CROSS/BLUE SHIELD | Attending: Nurse Practitioner | Admitting: Nurse Practitioner

## 2018-03-17 VITALS — BP 148/100 | HR 68 | Temp 98.9°F | Ht 73.0 in | Wt 255.0 lb

## 2018-03-17 DIAGNOSIS — N186 End stage renal disease: Secondary | ICD-10-CM | POA: Diagnosis not present

## 2018-03-17 DIAGNOSIS — Z881 Allergy status to other antibiotic agents status: Secondary | ICD-10-CM | POA: Insufficient documentation

## 2018-03-17 DIAGNOSIS — I10 Essential (primary) hypertension: Secondary | ICD-10-CM | POA: Diagnosis not present

## 2018-03-17 DIAGNOSIS — Z79899 Other long term (current) drug therapy: Secondary | ICD-10-CM | POA: Insufficient documentation

## 2018-03-17 DIAGNOSIS — Z833 Family history of diabetes mellitus: Secondary | ICD-10-CM | POA: Insufficient documentation

## 2018-03-17 DIAGNOSIS — Z8673 Personal history of transient ischemic attack (TIA), and cerebral infarction without residual deficits: Secondary | ICD-10-CM | POA: Diagnosis not present

## 2018-03-17 DIAGNOSIS — R569 Unspecified convulsions: Secondary | ICD-10-CM | POA: Insufficient documentation

## 2018-03-17 DIAGNOSIS — Z7901 Long term (current) use of anticoagulants: Secondary | ICD-10-CM | POA: Insufficient documentation

## 2018-03-17 DIAGNOSIS — Z9889 Other specified postprocedural states: Secondary | ICD-10-CM | POA: Insufficient documentation

## 2018-03-17 DIAGNOSIS — I120 Hypertensive chronic kidney disease with stage 5 chronic kidney disease or end stage renal disease: Secondary | ICD-10-CM | POA: Insufficient documentation

## 2018-03-17 MED ORDER — HYDROCHLOROTHIAZIDE 25 MG PO TABS: 25.0000 mg | ORAL_TABLET | Freq: Every day | ORAL | 2 refills | Status: DC

## 2018-03-17 MED ORDER — AMLODIPINE BESYLATE 10 MG PO TABS: 10.0000 mg | ORAL_TABLET | Freq: Every day | ORAL | 3 refills | Status: DC

## 2018-03-17 MED ORDER — APIXABAN 5 MG PO TABS: 5.0000 mg | ORAL_TABLET | Freq: Two times a day (BID) | ORAL | 3 refills | Status: DC

## 2018-03-17 MED FILL — HYDROCHLOROTHIAZIDE 25 MG T: 25 | 30 days supply | Qty: 30 | Fill #0

## 2018-03-17 MED FILL — ELIQUIS 5 MG TABLET: 5 | 30 days supply | Qty: 60 | Fill #0

## 2018-03-17 NOTE — Progress Notes (Signed)
Assessment & Plan:  [name] [name] was seen today for follow-up.  Diagnoses and all orders for this visit:  Essential hypertension -     hydrochlorothiazide (HYDRODIURIL) 25 MG tablet; Take 1 tablet (25 mg total) by mouth daily. -     amLODipine (NORVASC) 10 MG tablet; Take 1 tablet (10 mg total) by mouth daily. Continue all antihypertensives as prescribed.  Remember to bring in your blood pressure log with you for your follow up appointment.  DASH/Mediterranean Diets are healthier choices for HTN.    ESRD (end stage renal disease) [facility]) Patient has been instructed to contact his nephrologist and his vascular surgeon to schedule for dialysis as soon as possible.   History of stroke -     apixaban (ELIQUIS) 2.5 MG TABS tablet; Take 1 tablet (2.5 mg total) by mouth 2 (two) times daily.    Patient has been counseled on age-appropriate routine health concerns for screening and prevention. These are reviewed and up-to-date. Referrals have been placed accordingly. Immunizations are up-to-date or declined.    Subjective:   Chief Complaint  Patient presents with  . Follow-up    Pt. is here to follow-up on hypertension.    HPI [name] [name] 37 y.o. male presents to office today for follow up to HTN. He established care with me on 01-10-2018. He has an AVF for ESRD. Taking Eliquis for history of stroke. I have spoken with his previous nephrologist at [facility] and it was recommended that eliquis be reduced from 5 mg to 2.5 mg BID.   He has not been compliant with any of his specialist follow up appointments. He no showed for his nephrology and vascular appointments. Still has not started dialysis as he has not rescheduled any of his appointments. I am not sure if he realizes the severity of his condition.   CHRONIC HYPERTENSION Disease Monitoring  Blood pressure range Not well controlled today. He ran out of his antihypertensives several weeks ago. He also has a bag of empty pill bottles and amlodipine  is not one of the medicines in his bag. He does not know what any of his medications are being prescribed for. He does not monitor his blood pressure at home. He is supposed to be taking amlodipine 10mg , coreg 3.125mg , HCTZ 25mg   BP Readings from Last 3 Encounters:  03/17/18 (!) 148/100  01/15/18 124/80  01/10/18 123/79   Chest pain: no   Dyspnea: no   Claudication: no  Medication compliance: no  Medication Side Effects  Lightheadedness: no   Urinary frequency: no   Edema: yes   Impotence: yes  Preventitive Healthcare:  Exercise: no   Diet Pattern: diet: general  Salt Restriction:  no     Review of Systems  Constitutional: Negative for fever, malaise/fatigue and weight loss.  HENT: Negative.  Negative for nosebleeds.   Eyes: Negative.  Negative for blurred vision, double vision and photophobia.  Respiratory: Negative.  Negative for cough and shortness of breath.   Cardiovascular: Positive for leg swelling. Negative for chest pain and palpitations.  Gastrointestinal: Negative.  Negative for heartburn, nausea and vomiting.  Musculoskeletal: Negative.  Negative for myalgias.  Neurological: Negative.  Negative for dizziness, focal weakness, seizures and headaches.  Psychiatric/Behavioral: Negative.  Negative for suicidal ideas.    Past Medical History:  Diagnosis Date  . Chronic kidney disease   . Hypertension   . Lupus ([place])   . Seizures ([place])   . Seizures ([place])   . Stroke ([place])   .  Stroke [facility])    4 or 37 years of age.      Past Surgical History:  Procedure Laterality Date  . AV FISTULA PLACEMENT Left 12/30/2017   Procedure: ARTERIOVENOUS (AV) FISTULA CREATION VERSUS INSERTION OF ARTERIOVENOUS GRAFT LEFT ARM;  Surgeon: Rosetta Posner, MD;  Location: [place];  Service: Vascular;  Laterality: Left;  . RENAL BIOPSY      Family History  Problem Relation Age of Onset  . Diabetes Mother   . Diabetes Maternal Grandmother     Social History Reviewed with no changes to be  made today.   Outpatient Medications Prior to Visit  Medication Sig Dispense Refill  . albuterol (PROVENTIL HFA;VENTOLIN HFA) 108 (90 Base) MCG/ACT inhaler Inhale 1-2 puffs into the lungs every 4 (four) hours as needed for wheezing or shortness of breath (or cough). 1 Inhaler 0  . atorvastatin (LIPITOR) 40 MG tablet Take 1 tablet (40 mg total) by mouth daily. 90 tablet 3  . calcitRIOL (ROCALTROL) 0.25 MCG capsule Take 1 capsule (0.25 mcg total) by mouth daily. 30 capsule 2  . calcium acetate (PHOSLO) 667 MG capsule Take 2 capsules (1,334 mg total) by mouth 3 (three) times daily with meals. Take extra 1 capsule with snacks 90 capsule 3  . carvedilol (COREG) 3.125 MG tablet Take 1 tablet (3.125 mg total) by mouth 2 (two) times daily with a meal. 60 tablet 1  . LamoTRIgine (LAMICTAL XR) 300 MG TB24 24 hour tablet Take 1 tablet (300 mg total) by mouth daily. 90 tablet 6  . amLODipine (NORVASC) 10 MG tablet Take 1 tablet (10 mg total) by mouth daily. 90 tablet 3  . apixaban (ELIQUIS) 5 MG TABS tablet Take 1 tablet (5 mg total) by mouth 2 (two) times daily. 60 tablet 1  . hydrochlorothiazide (HYDRODIURIL) 25 MG tablet Take 25 mg by mouth daily.    . traMADol (ULTRAM) 50 MG tablet Take 1 tablet (50 mg total) by mouth every 6 (six) hours as needed. (Patient not taking: Reported on 01/10/2018) 20 tablet 0   No facility-administered medications prior to visit.     Allergies  Allergen Reactions  . Zyrtec [Cetirizine]     Facial swelling       Objective:    BP (!) 148/100 (BP Location: Left Arm, Patient Position: Sitting, Cuff Size: Large)   Pulse 68   Temp 98.9 F (37.2 C) (Oral)   Ht 6\' 1"  (1.854 m)   Wt 255 lb (115.7 kg)   SpO2 100%   BMI 33.64 kg/m  Wt Readings from Last 3 Encounters:  03/17/18 255 lb (115.7 kg)  01/15/18 274 lb (124.3 kg)  01/10/18 279 lb 6.4 oz (126.7 kg)    Physical Exam  Constitutional: He is oriented to person, place, and time. He appears well-developed and  well-nourished. He is cooperative.  HENT:  Head: Normocephalic and atraumatic.  Eyes: EOM are normal.  Neck: Normal range of motion.  Cardiovascular: Normal rate, regular rhythm and normal heart sounds. Exam reveals no gallop and no friction rub.  No murmur heard. Pulmonary/Chest: Effort normal and breath sounds normal. No tachypnea. No respiratory distress. He has no decreased breath sounds. He has no wheezes. He has no rhonchi. He has no rales. He exhibits no tenderness.  Abdominal: Bowel sounds are normal.  Musculoskeletal: Normal range of motion. He exhibits no edema.  Neurological: He is alert and oriented to person, place, and time. Coordination normal.  Skin: Skin is warm and dry.  Psychiatric:  He has a normal mood and affect. His behavior is normal. Judgment and thought content normal.  Nursing note and vitals reviewed.        Patient has been counseled extensively about nutrition and exercise as well as the importance of adherence with medications and regular follow-up. The patient was given clear instructions to go to ER or return to medical center if symptoms don't improve, worsen or new problems develop. The patient verbalized understanding.   Follow-up: Return in about 6 weeks (around 04/28/2018) for BP recheck.   Gildardo Pounds, FNP-BC [facility] and [place], [place]   03/18/2018, 11:00 PM

## 2018-03-17 NOTE — Patient Instructions (Addendum)
[facility] Cardiologist Address: [address], [place], [place] [address] Phone: [phone]   End-Stage Kidney Disease End-stage kidney disease occurs when the kidneys are so damaged that they cannot do their job. The kidneys are two organs that do many important jobs in the body, which include:  Removing wastes and extra fluids from the blood.  Making hormones that maintain the amount of fluid in your tissues and blood vessels.  Maintaining the right amount of fluids and chemicals in the body.  When the kidneys are damaged and cannot do their job, life-threatening problems occur. Without the help of the kidneys, toxins build up in the blood. In end-stage kidney disease, the kidneys cannot get better. What are the causes? End-stage kidney disease usually occurs when a long-lasting (chronic) kidney disease gets worse. It may also occur after the kidneys are suddenly damaged (acute kidney injury). What increases the risk? This condition is more likely to develop in people who are:  Older than age 49.  Male.  Of African-American descent.  Current smokers or former smokers.  Obese.  You may also have an increased risk for end-stage kidney disease if you:  Have a family history of chronic kidney disease (CKD).  Have had kidney disease for many years.  Have other longstanding medical conditions that affect the kidneys, such as: ? Cardiovascular disease including high blood pressure. ? Diabetes. ? Certain diseases that affect the immune system.  What are the signs or symptoms?  Swelling (edema) of the face, legs, ankles, or feet.  Numbness, tingling, or loss of feeling (sensation) in your hands or feet.  Tiredness (lethargy).  Nausea or vomiting.  Confusion, trouble concentrating, or loss of consciousness.  Chest pain.  Shortness of breath.  Little to no urine production.  Muscle twitches and cramps, especially in the  legs.  Constant itchiness.  Loss of appetite.  Pale skin and tissue lining your eyelids (conjunctiva).  Headaches.  Abnormally dark or light skin.  Decrease in muscle size (muscle wasting).  Easy bruising.  Frequent hiccups.  Stopping of menstruation in women.  Seizures. How is this diagnosed? Your health care provider will measure your blood pressure and do some tests. These may include:  Urine tests.  Blood tests.  Imaging tests.  A test in which a sample of tissue is removed from the kidneys to be looked at under a microscope (kidney biopsy).  How is this treated? There are two treatments for end-stage kidney disease:  A procedure that removes toxic wastes from the body (dialysis). Depending on the type of dialysis you choose, it may be performed more than one time a day (peritoneal dialysis) or several times a week (hemodialysis).  Surgery toreceive a new kidney (kidney transplant).  In addition to having dialysis or a kidney transplant, you may need to take medicines:  To control high blood pressure (hypertension).  To control cholesterol.  To maintain healthy electrolyte levels in your blood.  You may also be given a specific diet to follow that includes requirements or limits for:  Salt (sodium).  Protein.  Phosphorous.  Potassium.  Calcium.  Follow these instructions at home:  Follow your prescribed diet.  Take over-the-counter and prescription medicines only as told by your health care provider. ? Do not take any new medicines unless approved by your health care provider. Many medicines can worsen your kidney damage. ? Do not take any vitamin and mineral supplements unless approved by your health care provider. Many nutritional  supplements can worsen your kidney damage. ? The dose of some medicines that you take may need to be adjusted.  Do not use any tobacco products, such as cigarettes, chewing tobacco, and e-cigarettes. If you need  help quitting, ask your health care provider.  Keep all follow-up visits as told by your health care provider. This is important.  Keep track of your blood pressure. Report changes in your blood pressure as told by your health care provider.  Achieve and maintain a healthy weight. If you need help with this, ask your health care provider.  Start or continue an exercise plan. Try to exercise at least 30 minutes a day, 5 days a week.  Stay current with immunizations as told by your health care provider. Where to find more information:  American Association of Kidney Patients: BombTimer.gl  National Kidney Foundation: www.kidney.[place]: https://mathis.com/  [facility]: www.lifeoptions.org and www.kidneyschool.org Contact a health care provider if:  Your symptoms get worse.  You develop new symptoms. Get help right away if:  You have weakness in an arm or leg on one side of your body.  You have difficulty speaking or you are slurring your speech.  You have a sudden change in your vision.  You have a sudden, severe headache.  You have a sudden weight increase.  You have difficulty breathing.  Your symptoms suddenly get worse. This information is not intended to replace advice given to you by your health care provider. Make sure you discuss any questions you have with your health care provider. Document Released: 08/25/2003 Document Revised: 11/10/2015 Document Reviewed: 02/01/2012 Elsevier Interactive Patient Education  2017 [employer].

## 2018-03-18 ENCOUNTER — Encounter: Payer: Self-pay | Admitting: Nurse Practitioner

## 2018-03-18 DIAGNOSIS — Z992 Dependence on renal dialysis: Secondary | ICD-10-CM | POA: Insufficient documentation

## 2018-03-18 DIAGNOSIS — N186 End stage renal disease: Secondary | ICD-10-CM | POA: Insufficient documentation

## 2018-03-18 MED ORDER — APIXABAN 2.5 MG PO TABS: 2.5000 mg | ORAL_TABLET | Freq: Two times a day (BID) | ORAL | 3 refills | Status: DC

## 2018-03-19 MED FILL — ELIQUIS 2.5 MG TABLET: 2.5 | 30 days supply | Qty: 60 | Fill #0

## 2018-03-25 MED FILL — AMLODIPINE BESYLATE 10 MG T: 10 | 30 days supply | Qty: 30 | Fill #0

## 2018-04-08 DIAGNOSIS — N2581 Secondary hyperparathyroidism of renal origin: Secondary | ICD-10-CM | POA: Diagnosis not present

## 2018-04-08 DIAGNOSIS — I120 Hypertensive chronic kidney disease with stage 5 chronic kidney disease or end stage renal disease: Secondary | ICD-10-CM | POA: Diagnosis not present

## 2018-04-08 DIAGNOSIS — N185 Chronic kidney disease, stage 5: Secondary | ICD-10-CM | POA: Diagnosis not present

## 2018-04-08 DIAGNOSIS — D631 Anemia in chronic kidney disease: Secondary | ICD-10-CM | POA: Diagnosis not present

## 2018-04-08 MED FILL — CALCIUM ACETATE 667 MG CAP: 667 | 30 days supply | Qty: 90 | Fill #0

## 2018-04-11 ENCOUNTER — Ambulatory Visit (HOSPITAL_COMMUNITY)
Admission: RE | Admit: 2018-04-11 | Discharge: 2018-04-11 | Disposition: A | Discharge status: Home / Self Care | Payer: BLUE CROSS/BLUE SHIELD | Source: Ambulatory Visit | Attending: Nephrology | Admitting: Nephrology

## 2018-04-11 VITALS — BP 130/82 | HR 71 | Temp 98.0°F | Resp 20

## 2018-04-11 DIAGNOSIS — N183 Chronic kidney disease, stage 3 unspecified: Secondary | ICD-10-CM

## 2018-04-11 DIAGNOSIS — D631 Anemia in chronic kidney disease: Secondary | ICD-10-CM | POA: Insufficient documentation

## 2018-04-11 DIAGNOSIS — N185 Chronic kidney disease, stage 5: Secondary | ICD-10-CM | POA: Diagnosis not present

## 2018-04-11 DIAGNOSIS — Z79899 Other long term (current) drug therapy: Secondary | ICD-10-CM | POA: Diagnosis not present

## 2018-04-11 DIAGNOSIS — N186 End stage renal disease: Secondary | ICD-10-CM

## 2018-04-11 DIAGNOSIS — Z5181 Encounter for therapeutic drug level monitoring: Secondary | ICD-10-CM | POA: Insufficient documentation

## 2018-04-11 LAB — POCT HEMOGLOBIN-HEMACUE: Hemoglobin: 7.6 g/dL — ABNORMAL LOW (ref 13.0–17.0)

## 2018-04-11 MED ORDER — EPOETIN ALFA-EPBX 10000 UNIT/ML IJ SOLN
10000.0000 [IU] | INTRAMUSCULAR | Status: DC
  Administered 2018-04-11: 10000 [IU] via SUBCUTANEOUS
  Filled 2018-04-11: qty 1

## 2018-04-11 MED FILL — CALCITRIOL 0.25 MCG CAPS: 0.25 | 30 days supply | Qty: 30 | Fill #1

## 2018-04-11 MED FILL — CARVEDILOL 3.125 MG TABLET: 3.125 | 30 days supply | Qty: 60 | Fill #1

## 2018-04-11 NOTE — Discharge Instructions (Signed)
Epoetin Alfa injection °What is this medicine? °EPOETIN ALFA (e POE e tin AL fa) helps your body make more red blood cells. This medicine is used to treat anemia caused by chronic kidney failure, cancer chemotherapy, or HIV-therapy. It may also be used before surgery if you have anemia. °This medicine may be used for other purposes; ask your health care provider or pharmacist if you have questions. °COMMON BRAND NAME(S): Epogen, Procrit °What should I tell my health care provider before I take this medicine? °They need to know if you have any of these conditions: °-blood clotting disorders °-cancer patient not on chemotherapy °-cystic fibrosis °-heart disease, such as angina or heart failure °-hemoglobin level of 12 g/dL or greater °-high blood pressure °-low levels of folate, iron, or vitamin B12 °-seizures °-an unusual or allergic reaction to erythropoietin, albumin, benzyl alcohol, hamster proteins, other medicines, foods, dyes, or preservatives °-pregnant or trying to get pregnant °-breast-feeding °How should I use this medicine? °This medicine is for injection into a vein or under the skin. It is usually given by a health care professional in a hospital or clinic setting. °If you get this medicine at home, you will be taught how to prepare and give this medicine. Use exactly as directed. Take your medicine at regular intervals. Do not take your medicine more often than directed. °It is important that you put your used needles and syringes in a special sharps container. Do not put them in a trash can. If you do not have a sharps container, call your pharmacist or healthcare provider to get one. °A special MedGuide will be given to you by the pharmacist with each prescription and refill. Be sure to read this information carefully each time. °Talk to your pediatrician regarding the use of this medicine in children. While this drug may be prescribed for selected conditions, precautions do apply. °Overdosage: If you  think you have taken too much of this medicine contact a poison control center or emergency room at once. °NOTE: This medicine is only for you. Do not share this medicine with others. °What if I miss a dose? °If you miss a dose, take it as soon as you can. If it is almost time for your next dose, take only that dose. Do not take double or extra doses. °What may interact with this medicine? °Do not take this medicine with any of the following medications: °-darbepoetin alfa °This list may not describe all possible interactions. Give your health care provider a list of all the medicines, herbs, non-prescription drugs, or dietary supplements you use. Also tell them if you smoke, drink alcohol, or use illegal drugs. Some items may interact with your medicine. °What should I watch for while using this medicine? °Your condition will be monitored carefully while you are receiving this medicine. °You may need blood work done while you are taking this medicine. °What side effects may I notice from receiving this medicine? °Side effects that you should report to your doctor or health care professional as soon as possible: °-allergic reactions like skin rash, itching or hives, swelling of the face, lips, or tongue °-breathing problems °-changes in vision °-chest pain °-confusion, trouble speaking or understanding °-feeling faint or lightheaded, falls °-high blood pressure °-muscle aches or pains °-pain, swelling, warmth in the leg °-rapid weight gain °-severe headaches °-sudden numbness or weakness of the face, arm or leg °-trouble walking, dizziness, loss of balance or coordination °-seizures (convulsions) °-swelling of the ankles, feet, hands °-unusually weak or tired °  Side effects that usually do not require medical attention (report to your doctor or health care professional if they continue or are bothersome): °-diarrhea °-fever, chills (flu-like symptoms) °-headaches °-nausea, vomiting °-redness, stinging, or swelling at  site where injected °This list may not describe all possible side effects. Call your doctor for medical advice about side effects. You may report side effects to FDA at 1-800-FDA-1088. °Where should I keep my medicine? °Keep out of the reach of children. °Store in a refrigerator between 2 and 8 degrees C (36 and 46 degrees F). Do not freeze or shake. Throw away any unused portion if using a single-dose vial. Multi-dose vials can be kept in the refrigerator for up to 21 days after the initial dose. Throw away unused medicine. °NOTE: This sheet is a summary. It may not cover all possible information. If you have questions about this medicine, talk to your doctor, pharmacist, or health care provider. °© 2018 Elsevier/Gold Standard (2016-01-23 19:42:31) ° °

## 2018-04-11 NOTE — Progress Notes (Signed)
Hgb 7.4 . Spoke with Erline Levine at Dr Joselyn Arrow office. She spoke with him and instructed [place] to give shot as ordered and let him go.

## 2018-04-15 ENCOUNTER — Other Ambulatory Visit: Payer: Self-pay

## 2018-04-15 DIAGNOSIS — T82510A Breakdown (mechanical) of surgically created arteriovenous fistula, initial encounter: Secondary | ICD-10-CM

## 2018-04-17 MED FILL — HYDROCHLOROTHIAZIDE 25 MG T: 25 | 90 days supply | Qty: 90 | Fill #1

## 2018-04-17 MED FILL — ATORVASTATIN 40 MG TABLET: 40 | 90 days supply | Qty: 90 | Fill #1

## 2018-04-23 ENCOUNTER — Ambulatory Visit: Payer: BLUE CROSS/BLUE SHIELD

## 2018-04-23 ENCOUNTER — Ambulatory Visit (HOSPITAL_COMMUNITY): Admission: RE | Admit: 2018-04-23 | Payer: BLUE CROSS/BLUE SHIELD | Source: Ambulatory Visit

## 2018-04-23 NOTE — Progress Notes (Deleted)
[facility] [place] HISTORY AND PHYSICAL   History of Present Illness:  Patient is a 37 y.o. year old male who presents for examination s/p Left upper arm AV fistula creation brachiocephalic.  He was first seen 7/8 /2019 secondary to CKD stage 5.       Past Medical History:  Diagnosis Date  . Chronic kidney disease   . Hypertension   . Lupus ([place])   . Seizures ([place])   . Seizures ([place])   . Stroke ([place])   . Stroke [facility])    52 or 37 years of age.      Past Surgical History:  Procedure Laterality Date  . AV FISTULA PLACEMENT Left 12/30/2017   Procedure: ARTERIOVENOUS (AV) FISTULA CREATION VERSUS INSERTION OF ARTERIOVENOUS GRAFT LEFT ARM;  Surgeon: Rosetta Posner, MD;  Location: [facility];  Service: Vascular;  Laterality: Left;  . RENAL BIOPSY       Social History Social History   Tobacco Use  . Smoking status: Former Smoker    Packs/day: 0.25    Types: Cigarettes    Last attempt to quit: 12/21/2017    Years since quitting: 0.3  . Smokeless tobacco: Never Used  Substance Use Topics  . Alcohol use: Not Currently  . Drug use: No    Family History Family History  Problem Relation Age of Onset  . Diabetes Mother   . Diabetes Maternal Grandmother     Allergies  Allergies  Allergen Reactions  . Zyrtec [Cetirizine]     Facial swelling     Current Outpatient Medications  Medication Sig Dispense Refill  . albuterol (PROVENTIL HFA;VENTOLIN HFA) 108 (90 Base) MCG/ACT inhaler Inhale 1-2 puffs into the lungs every 4 (four) hours as needed for wheezing or shortness of breath (or cough). 1 Inhaler 0  . amLODipine (NORVASC) 10 MG tablet Take 1 tablet (10 mg total) by mouth daily. 90 tablet 3  . apixaban (ELIQUIS) 2.5 MG TABS tablet Take 1 tablet (2.5 mg total) by mouth 2 (two) times daily. 60 tablet 3  . atorvastatin (LIPITOR) 40 MG tablet Take 1 tablet (40 mg total) by mouth daily. 90 tablet 3  . calcitRIOL (ROCALTROL) 0.25 MCG capsule Take 1 capsule (0.25  mcg total) by mouth daily. 30 capsule 2  . calcium acetate (PHOSLO) 667 MG capsule Take 2 capsules (1,334 mg total) by mouth 3 (three) times daily with meals. Take extra 1 capsule with snacks 90 capsule 3  . carvedilol (COREG) 3.125 MG tablet Take 1 tablet (3.125 mg total) by mouth 2 (two) times daily with a meal. 60 tablet 1  . hydrochlorothiazide (HYDRODIURIL) 25 MG tablet Take 1 tablet (25 mg total) by mouth daily. 90 tablet 2  . LamoTRIgine (LAMICTAL XR) 300 MG TB24 24 hour tablet Take 1 tablet (300 mg total) by mouth daily. 90 tablet 6  . traMADol (ULTRAM) 50 MG tablet Take 1 tablet (50 mg total) by mouth every 6 (six) hours as needed. (Patient not taking: Reported on 01/10/2018) 20 tablet 0   No current facility-administered medications for this visit.     ROS:   General:  No weight loss, Fever, chills  HEENT: No recent headaches, no nasal bleeding, no visual changes, no sore throat  Neurologic: No dizziness, blackouts, seizures. No recent symptoms of stroke or mini- stroke. No recent episodes of slurred speech, or temporary blindness.  Cardiac: No recent episodes of chest pain/pressure, no shortness of breath at rest.  No shortness of breath with  exertion.  Denies history of atrial fibrillation or irregular heartbeat  Vascular: No history of rest pain in feet.  No history of claudication.  No history of non-healing ulcer, No history of DVT   Pulmonary: No home oxygen, no productive cough, no hemoptysis,  No asthma or wheezing  Musculoskeletal:  [ ]  Arthritis, [ ]  Low back pain,  [ ]  Joint pain  Hematologic:No history of hypercoagulable state.  No history of easy bleeding.  No history of anemia  Gastrointestinal: No hematochezia or melena,  No gastroesophageal reflux, no trouble swallowing  Urinary: [ ]  chronic Kidney disease, [ ]  on HD - [ ]  MWF or [ ]  TTHS, [ ]  Burning with urination, [ ]  Frequent urination, [ ]  Difficulty urinating;   Skin: No rashes  Psychological: No  history of anxiety,  No history of depression   Physical Examination  There were no vitals filed for this visit.  There is no height or weight on file to calculate BMI.  General:  Alert and oriented, no acute distress HEENT: Normal Neck: No bruit or JVD Pulmonary: Clear to auscultation bilaterally Cardiac: Regular Rate and Rhythm without murmur Gastrointestinal: Soft, non-tender, non-distended, no mass, no scars Skin: No rash Extremity Pulses:  2+ radial, brachial pulses bilaterally Musculoskeletal: No deformity or edema  Neurologic: Upper and lower extremity motor 5/5 and symmetric  DATA:    ASSESSMENT:    PLAN:  Ruta Hinds, MD [facility] [place] Office: [phone] Pager: [phone]

## 2018-04-24 ENCOUNTER — Encounter: Payer: Self-pay | Admitting: General Practice

## 2018-04-25 ENCOUNTER — Ambulatory Visit (HOSPITAL_COMMUNITY)
Admission: RE | Admit: 2018-04-25 | Discharge: 2018-04-25 | Disposition: A | Discharge status: Home / Self Care | Payer: BLUE CROSS/BLUE SHIELD | Source: Ambulatory Visit | Attending: Nephrology | Admitting: Nephrology

## 2018-04-25 DIAGNOSIS — N186 End stage renal disease: Secondary | ICD-10-CM | POA: Diagnosis not present

## 2018-04-25 DIAGNOSIS — N183 Chronic kidney disease, stage 3 unspecified: Secondary | ICD-10-CM

## 2018-04-25 LAB — POCT HEMOGLOBIN-HEMACUE: HEMOGLOBIN: 8.6 g/dL — AB (ref 13.0–17.0)

## 2018-04-25 MED ORDER — EPOETIN ALFA-EPBX 10000 UNIT/ML IJ SOLN
10000.0000 [IU] | INTRAMUSCULAR | Status: DC
  Administered 2018-04-25: 10000 [IU] via SUBCUTANEOUS
  Filled 2018-04-25: qty 1

## 2018-04-28 ENCOUNTER — Ambulatory Visit: Payer: BLUE CROSS/BLUE SHIELD | Admitting: Nurse Practitioner

## 2018-04-29 ENCOUNTER — Ambulatory Visit (INDEPENDENT_AMBULATORY_CARE_PROVIDER_SITE_OTHER): Payer: BLUE CROSS/BLUE SHIELD | Admitting: Family

## 2018-04-29 ENCOUNTER — Encounter: Payer: Self-pay | Admitting: Family

## 2018-04-29 ENCOUNTER — Ambulatory Visit (HOSPITAL_COMMUNITY)
Admission: RE | Admit: 2018-04-29 | Discharge: 2018-04-29 | Disposition: A | Discharge status: Home / Self Care | Payer: BLUE CROSS/BLUE SHIELD | Source: Ambulatory Visit | Attending: Family | Admitting: Family

## 2018-04-29 ENCOUNTER — Other Ambulatory Visit: Payer: Self-pay

## 2018-04-29 VITALS — BP 131/80 | HR 79 | Temp 99.1°F | Resp 16 | Ht 72.0 in | Wt 257.0 lb

## 2018-04-29 DIAGNOSIS — I808 Phlebitis and thrombophlebitis of other sites: Secondary | ICD-10-CM

## 2018-04-29 DIAGNOSIS — T82510A Breakdown (mechanical) of surgically created arteriovenous fistula, initial encounter: Secondary | ICD-10-CM | POA: Diagnosis not present

## 2018-04-29 DIAGNOSIS — L03114 Cellulitis of left upper limb: Secondary | ICD-10-CM | POA: Diagnosis not present

## 2018-04-29 DIAGNOSIS — T82868A Thrombosis of vascular prosthetic devices, implants and grafts, initial encounter: Secondary | ICD-10-CM | POA: Diagnosis not present

## 2018-04-29 MED ORDER — CEPHALEXIN 500 MG PO CAPS: 500.0000 mg | ORAL_CAPSULE | Freq: Two times a day (BID) | ORAL | 0 refills | Status: DC

## 2018-04-29 MED FILL — CEPHALEXIN 500 MG CAPSULE: 500 | 10 days supply | Qty: 20 | Fill #0

## 2018-04-29 NOTE — Progress Notes (Signed)
CC: walked in clinic, problem with AV fistula, CKD, not yet on hemodialysis   History of Present Illness  [name] [name] is a 37 y.o. (1980-11-23) male who is s/p left upper arm brachiocephalic AV fistula creation on 12-30-17 by Dr. Donnetta Hutching.   This is his first access placed. He is right hand dominant.   He is not yet on hemodialysis. Serum creatinine 10.53, GFR 6 on 12-26-17.  He takes Eliquis. He has Lupus.  He also takes a statin and a beta blocker.   He returns today with report that he saw [facility] in September 2019, he states an ultrasound was done there, was told that he had a blood clot in his AV fistula. Due to job conflicts, he has not been able to follow up with [place] until today.  Yesterday his left upper arm started feeling warm. He denies fever or chills.   His understanding of when he needs to start hemodialysis is soon, but not immediatly.   He denies any steal type symptoms in his left upper extremity.    Past Medical History:  Diagnosis Date  . Chronic kidney disease   . Hypertension   . Lupus ([place])   . Seizures ([place])   . Seizures ([place])   . Stroke ([place])   . Stroke [facility])    89 or 1 years of age.      Social History Social History   Tobacco Use  . Smoking status: Former Smoker    Packs/day: 0.25    Types: Cigarettes    Last attempt to quit: 12/21/2017    Years since quitting: 0.3  . Smokeless tobacco: Never Used  Substance Use Topics  . Alcohol use: Not Currently  . Drug use: No    Family History Family History  Problem Relation Age of Onset  . Diabetes Mother   . Diabetes Maternal Grandmother     Surgical History Past Surgical History:  Procedure Laterality Date  . AV FISTULA PLACEMENT Left 12/30/2017   Procedure: ARTERIOVENOUS (AV) FISTULA CREATION VERSUS INSERTION OF ARTERIOVENOUS GRAFT LEFT ARM;  Surgeon: Rosetta Posner, MD;  Location: [facility];  Service: Vascular;  Laterality: Left;  . RENAL BIOPSY      Allergies  Allergen Reactions  .  Zyrtec [Cetirizine]     Facial swelling    Current Outpatient Medications  Medication Sig Dispense Refill  . amLODipine (NORVASC) 10 MG tablet Take 1 tablet (10 mg total) by mouth daily. 90 tablet 3  . atorvastatin (LIPITOR) 40 MG tablet Take 1 tablet (40 mg total) by mouth daily. 90 tablet 3  . calcitRIOL (ROCALTROL) 0.25 MCG capsule Take 1 capsule (0.25 mcg total) by mouth daily. 30 capsule 2  . calcium acetate (PHOSLO) 667 MG capsule Take 2 capsules (1,334 mg total) by mouth 3 (three) times daily with meals. Take extra 1 capsule with snacks 90 capsule 3  . carvedilol (COREG) 3.125 MG tablet Take 1 tablet (3.125 mg total) by mouth 2 (two) times daily with a meal. 60 tablet 1  . hydrochlorothiazide (HYDRODIURIL) 25 MG tablet Take 1 tablet (25 mg total) by mouth daily. 90 tablet 2  . LamoTRIgine (LAMICTAL XR) 300 MG TB24 24 hour tablet Take 1 tablet (300 mg total) by mouth daily. 90 tablet 6  . traMADol (ULTRAM) 50 MG tablet Take 1 tablet (50 mg total) by mouth every 6 (six) hours as needed. 20 tablet 0  . albuterol (PROVENTIL HFA;VENTOLIN HFA) 108 (90 Base) MCG/ACT inhaler Inhale  1-2 puffs into the lungs every 4 (four) hours as needed for wheezing or shortness of breath (or cough). (Patient not taking: Reported on 04/29/2018) 1 Inhaler 0  . apixaban (ELIQUIS) 2.5 MG TABS tablet Take 1 tablet (2.5 mg total) by mouth 2 (two) times daily. 60 tablet 3   No current facility-administered medications for this visit.      REVIEW OF SYSTEMS: see HPI for pertinent positives and negatives    PHYSICAL EXAMINATION:  Vitals:   04/29/18 0955  BP: 131/80  Pulse: 79  Resp: 16  Temp: 99.1 F (37.3 C)  TempSrc: Oral  SpO2: 100%  Weight: 257 lb (116.6 kg)  Height: 6' (1.829 m)   Body mass index is 34.86 kg/m.  General: Obese male appears his stated age.   HEENT:  No gross abnormalities Pulmonary: Respirations are non-labored, CTAB.  Abdomen: Soft and non-tender with normal pitched bowel  sounds, large panus.  Musculoskeletal: There are no major deformities.   Neurologic: No focal weakness or paresthesias are detected Skin: There are no ulcers, see Cardiovascular. Psychiatric: The patient has normal affect. Cardiovascular: There is a regular rate and rhythm without significant murmur appreciated.  Bilateral radial pulses are 2+ palpable. Bilateral PT pulses are 2+ palpable.  Hard slightly raised mass at left upper arm measures about 6 cm in height x 4 cm width, with erythema at the mass and extending beyond the mass; mass is moderately tender to touch. No palpable thrill at AVF.    DATA  Left Arm Dialysis Access Duplex ([id number]): Left cephalic vein is thrombosed with no flow from the mid to the distal forearm. The cephalic vein is compressible from the mid upper arm to the shoulder.  Thrombus noted in the distal upper arm.   Medical Decision Making  [name] [name] is a 37 y.o. male who is s/p left upper arm brachiocephalic AV fistula creation on 12-30-17 by Dr. Donnetta Hutching.   This is his first access placed. He is right hand dominant.   He is not yet on hemodialysis. Serum creatinine 10.53, GFR 6 on 12-26-17.   He is referred by Dr. Jannifer Hick for evaluation of pt left upper arm AV fistula. Referral documents faxed from Dr. 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 Recent creatinine was about 12 but he has no uremic symptoms at this time.  Ultrasound evaluation shows a good sized AVF distally that is filled with thrombus to mid upper arm where the AVF is noted to be severely stenosed to about 2 mm. There appears to be a trickle of flow through the area at peak systole. Discussed findings with pt and recommended vascular surgery evaluated given the severity of  the stenosis in a new AVF. He is agreeable."  Dr. Carlis Abbott spoke with and examined pt after reviewing results of today's duplex of left upper arm AVF.  Thrombosis of left arm AVF confirmed. Also present is likely a superficial thrombophlebitis with possible cellulitis of left upper arm. Will give a  10 day course of Keflex, 500 mg po bid, advise pt to apply warm compresses several times/day to left upper arm.   Will schedule vein mapping  ASAP of both upper extremities to evaluate for new access, see soonest available vascular  surgeon afterward.   The above was discussed with pt.   Clemon Chambers, RN, MSN, FNP-C [facility] [place] Office: [phone]  04/29/2018, 10:03 AM  Clinic MD: Carlis Abbott

## 2018-04-30 ENCOUNTER — Encounter (HOSPITAL_COMMUNITY): Payer: BLUE CROSS/BLUE SHIELD

## 2018-04-30 ENCOUNTER — Other Ambulatory Visit: Payer: Self-pay

## 2018-04-30 DIAGNOSIS — T82510A Breakdown (mechanical) of surgically created arteriovenous fistula, initial encounter: Secondary | ICD-10-CM

## 2018-05-01 ENCOUNTER — Encounter (HOSPITAL_COMMUNITY): Payer: Self-pay | Admitting: *Deleted

## 2018-05-01 ENCOUNTER — Encounter: Payer: Self-pay | Admitting: Vascular Surgery

## 2018-05-01 ENCOUNTER — Emergency Department (HOSPITAL_COMMUNITY)
Admission: EM | Admit: 2018-05-01 | Discharge: 2018-05-01 | Disposition: A | Discharge status: Home / Self Care | Payer: BLUE CROSS/BLUE SHIELD | Attending: Emergency Medicine | Admitting: Emergency Medicine

## 2018-05-01 ENCOUNTER — Ambulatory Visit (INDEPENDENT_AMBULATORY_CARE_PROVIDER_SITE_OTHER)
Admission: RE | Admit: 2018-05-01 | Discharge: 2018-05-01 | Disposition: A | Discharge status: Home / Self Care | Payer: BLUE CROSS/BLUE SHIELD | Source: Ambulatory Visit | Attending: Vascular Surgery | Admitting: Vascular Surgery

## 2018-05-01 ENCOUNTER — Other Ambulatory Visit: Payer: Self-pay

## 2018-05-01 DIAGNOSIS — N186 End stage renal disease: Secondary | ICD-10-CM | POA: Insufficient documentation

## 2018-05-01 DIAGNOSIS — Y999 Unspecified external cause status: Secondary | ICD-10-CM | POA: Insufficient documentation

## 2018-05-01 DIAGNOSIS — I120 Hypertensive chronic kidney disease with stage 5 chronic kidney disease or end stage renal disease: Secondary | ICD-10-CM | POA: Insufficient documentation

## 2018-05-01 DIAGNOSIS — Z79899 Other long term (current) drug therapy: Secondary | ICD-10-CM | POA: Diagnosis not present

## 2018-05-01 DIAGNOSIS — T82510A Breakdown (mechanical) of surgically created arteriovenous fistula, initial encounter: Secondary | ICD-10-CM

## 2018-05-01 DIAGNOSIS — Y92410 Unspecified street and highway as the place of occurrence of the external cause: Secondary | ICD-10-CM | POA: Insufficient documentation

## 2018-05-01 DIAGNOSIS — Z87891 Personal history of nicotine dependence: Secondary | ICD-10-CM | POA: Diagnosis not present

## 2018-05-01 DIAGNOSIS — S8991XA Unspecified injury of right lower leg, initial encounter: Secondary | ICD-10-CM | POA: Diagnosis present

## 2018-05-01 DIAGNOSIS — S8001XA Contusion of right knee, initial encounter: Secondary | ICD-10-CM

## 2018-05-01 DIAGNOSIS — Y939 Activity, unspecified: Secondary | ICD-10-CM | POA: Insufficient documentation

## 2018-05-01 MED ORDER — ACETAMINOPHEN 500 MG PO TABS
1000.0000 mg | ORAL_TABLET | Freq: Once | ORAL | Status: AC
  Administered 2018-05-01: 1000 mg via ORAL
  Filled 2018-05-01: qty 2
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
## 2018-05-01 NOTE — Discharge Instructions (Addendum)
Please return for any problem.  Follow-up with your regular care provider as instructed. °

## 2018-05-02 MED FILL — AMLODIPINE BESYLATE 10 MG T: 10 | 30 days supply | Qty: 30 | Fill #1

## 2018-05-09 ENCOUNTER — Encounter (HOSPITAL_COMMUNITY)
Admission: RE | Admit: 2018-05-09 | Discharge: 2018-05-09 | Disposition: A | Discharge status: Home / Self Care | Payer: BLUE CROSS/BLUE SHIELD | Source: Ambulatory Visit | Attending: Nephrology | Admitting: Nephrology

## 2018-05-09 ENCOUNTER — Encounter (HOSPITAL_COMMUNITY): Payer: BLUE CROSS/BLUE SHIELD

## 2018-05-09 VITALS — BP 139/93 | HR 75 | Temp 98.0°F | Resp 20

## 2018-05-09 DIAGNOSIS — N185 Chronic kidney disease, stage 5: Secondary | ICD-10-CM | POA: Diagnosis not present

## 2018-05-09 DIAGNOSIS — D631 Anemia in chronic kidney disease: Secondary | ICD-10-CM | POA: Insufficient documentation

## 2018-05-09 DIAGNOSIS — N183 Chronic kidney disease, stage 3 unspecified: Secondary | ICD-10-CM

## 2018-05-09 DIAGNOSIS — N186 End stage renal disease: Secondary | ICD-10-CM

## 2018-05-09 LAB — IRON AND TIBC
IRON: 66 ug/dL (ref 45–182)
SATURATION RATIOS: 31 % (ref 17.9–39.5)
TIBC: 210 ug/dL — AB (ref 250–450)
UIBC: 144 ug/dL

## 2018-05-09 LAB — FERRITIN: Ferritin: 293 ng/mL (ref 24–336)

## 2018-05-09 LAB — POCT HEMOGLOBIN-HEMACUE: HEMOGLOBIN: 8.2 g/dL — AB (ref 13.0–17.0)

## 2018-05-09 MED ORDER — EPOETIN ALFA-EPBX 10000 UNIT/ML IJ SOLN
10000.0000 [IU] | INTRAMUSCULAR | Status: DC
  Administered 2018-05-09: 10000 [IU] via SUBCUTANEOUS
  Filled 2018-05-09: qty 1

## 2018-05-21 ENCOUNTER — Ambulatory Visit: Payer: BLUE CROSS/BLUE SHIELD | Admitting: Nurse Practitioner

## 2018-05-23 ENCOUNTER — Encounter (HOSPITAL_COMMUNITY): Payer: BLUE CROSS/BLUE SHIELD

## 2018-05-23 ENCOUNTER — Encounter: Payer: BLUE CROSS/BLUE SHIELD | Admitting: Vascular Surgery

## 2018-06-06 ENCOUNTER — Ambulatory Visit (HOSPITAL_COMMUNITY)
Admission: RE | Admit: 2018-06-06 | Discharge: 2018-06-06 | Disposition: A | Discharge status: Home / Self Care | Payer: BLUE CROSS/BLUE SHIELD | Source: Ambulatory Visit | Attending: Nephrology | Admitting: Nephrology

## 2018-06-06 VITALS — BP 158/97 | HR 72 | Temp 98.1°F | Resp 20

## 2018-06-06 DIAGNOSIS — N183 Chronic kidney disease, stage 3 unspecified: Secondary | ICD-10-CM

## 2018-06-06 DIAGNOSIS — N186 End stage renal disease: Secondary | ICD-10-CM | POA: Diagnosis not present

## 2018-06-06 LAB — POCT HEMOGLOBIN-HEMACUE: Hemoglobin: 8.7 g/dL — ABNORMAL LOW (ref 13.0–17.0)

## 2018-06-06 LAB — IRON AND TIBC
IRON: 74 ug/dL (ref 45–182)
SATURATION RATIOS: 31 % (ref 17.9–39.5)
TIBC: 242 ug/dL — AB (ref 250–450)
UIBC: 168 ug/dL

## 2018-06-06 LAB — FERRITIN: Ferritin: 276 ng/mL (ref 24–336)

## 2018-06-06 MED ORDER — EPOETIN ALFA-EPBX 10000 UNIT/ML IJ SOLN
10000.0000 [IU] | INTRAMUSCULAR | Status: DC
  Administered 2018-06-06: 10000 [IU] via SUBCUTANEOUS
  Filled 2018-06-06: qty 1

## 2018-06-10 ENCOUNTER — Encounter: Payer: BLUE CROSS/BLUE SHIELD | Admitting: Vascular Surgery

## 2018-06-19 MED FILL — CARVEDILOL 3.125 MG TABLET: 3.125 | 30 days supply | Qty: 60 | Fill #2

## 2018-06-19 MED FILL — AMLODIPINE BESYLATE 10 MG T: 10 | 30 days supply | Qty: 30 | Fill #2

## 2018-06-19 MED FILL — CALCIUM ACETATE 667 MG CAP: 667 | 30 days supply | Qty: 90 | Fill #1

## 2018-06-19 MED FILL — HYDROCHLOROTHIAZIDE 25 MG T: 25 | 90 days supply | Qty: 90 | Fill #2

## 2018-06-20 MED FILL — CALCITRIOL 0.25 MCG CAPS: 0.25 | 30 days supply | Qty: 30 | Fill #2

## 2018-06-20 MED FILL — ELIQUIS 2.5 MG TABLET: 2.5 | 30 days supply | Qty: 60 | Fill #1

## 2018-07-04 ENCOUNTER — Ambulatory Visit (HOSPITAL_COMMUNITY)
Admission: RE | Admit: 2018-07-04 | Discharge: 2018-07-04 | Disposition: A | Discharge status: Home / Self Care | Payer: BLUE CROSS/BLUE SHIELD | Source: Ambulatory Visit | Attending: Nephrology | Admitting: Nephrology

## 2018-07-04 VITALS — BP 147/96 | HR 66 | Temp 98.7°F | Resp 20

## 2018-07-04 DIAGNOSIS — N183 Chronic kidney disease, stage 3 unspecified: Secondary | ICD-10-CM

## 2018-07-04 DIAGNOSIS — N186 End stage renal disease: Secondary | ICD-10-CM | POA: Diagnosis not present

## 2018-07-04 LAB — IRON AND TIBC
Iron: 85 ug/dL (ref 45–182)
Saturation Ratios: 34 % (ref 17.9–39.5)
TIBC: 251 ug/dL (ref 250–450)
UIBC: 166 ug/dL

## 2018-07-04 LAB — POCT HEMOGLOBIN-HEMACUE: HEMOGLOBIN: 9.9 g/dL — AB (ref 13.0–17.0)

## 2018-07-04 LAB — FERRITIN: FERRITIN: 268 ng/mL (ref 24–336)

## 2018-07-04 MED ORDER — EPOETIN ALFA-EPBX 10000 UNIT/ML IJ SOLN
20000.0000 [IU] | INTRAMUSCULAR | Status: DC
  Administered 2018-07-04: 20000 [IU] via SUBCUTANEOUS
  Filled 2018-07-04: qty 2

## 2018-07-15 ENCOUNTER — Other Ambulatory Visit: Payer: Self-pay

## 2018-07-15 ENCOUNTER — Ambulatory Visit (INDEPENDENT_AMBULATORY_CARE_PROVIDER_SITE_OTHER): Payer: BLUE CROSS/BLUE SHIELD | Admitting: Vascular Surgery

## 2018-07-15 ENCOUNTER — Encounter: Payer: Self-pay | Admitting: Vascular Surgery

## 2018-07-15 ENCOUNTER — Encounter: Payer: Self-pay | Admitting: *Deleted

## 2018-07-15 VITALS — BP 147/95 | HR 66 | Temp 97.6°F | Resp 20 | Ht 72.0 in | Wt 264.0 lb

## 2018-07-15 DIAGNOSIS — N184 Chronic kidney disease, stage 4 (severe): Secondary | ICD-10-CM

## 2018-07-15 NOTE — Progress Notes (Signed)
[facility]ist of [place]  Patient name: [name] [name] MRN: [id number] DOB: 1981/02/03 Sex: male  REASON FOR VISIT: Discuss access for hemodialysis  HPI: [name] [name] is a 38 y.o. male here today for discussion of hemodialysis access.  He is well-known to me from prior left brachiocephalic AV fistula creation on 12/30/2017.  He had good initial maturation of this.  Although the fistula was not being used, he had spontaneous thrombosis of this.  He is here today for discussion of new access.  He had undergone a duplex in November for preparation of this but elected not to proceed.  I have this for review  Past Medical History:  Diagnosis Date  . Chronic kidney disease   . Hypertension   . Lupus ([place])   . Seizures ([place])   . Seizures ([place])   . Stroke ([place])   . Stroke [facility])    13 or 38 years of age.      Family History  Problem Relation Age of Onset  . Diabetes Mother   . Diabetes Maternal Grandmother     SOCIAL HISTORY: Social History   Tobacco Use  . Smoking status: Former Smoker    Packs/day: 0.25    Types: Cigarettes    Last attempt to quit: 12/21/2017    Years since quitting: 0.5  . Smokeless tobacco: Never Used  Substance Use Topics  . Alcohol use: Not Currently    Allergies  Allergen Reactions  . Zyrtec [Cetirizine]     Facial swelling    Current Outpatient Medications  Medication Sig Dispense Refill  . amLODipine (NORVASC) 10 MG tablet Take 1 tablet (10 mg total) by mouth daily. 90 tablet 3  . atorvastatin (LIPITOR) 40 MG tablet Take 1 tablet (40 mg total) by mouth daily. 90 tablet 3  . calcitRIOL (ROCALTROL) 0.25 MCG capsule Take 1 capsule (0.25 mcg total) by mouth daily. 30 capsule 2  . calcium acetate (PHOSLO) 667 MG capsule Take 2 capsules (1,334 mg total) by mouth 3 (three) times daily with meals. Take extra 1 capsule with snacks 90 capsule 3  . carvedilol (COREG) 3.125 MG tablet Take 1 tablet (3.125  mg total) by mouth 2 (two) times daily with a meal. 60 tablet 1  . cephALEXin (KEFLEX) 500 MG capsule Take 1 capsule (500 mg total) by mouth 2 (two) times daily. 20 capsule 0  . LamoTRIgine (LAMICTAL XR) 300 MG TB24 24 hour tablet Take 1 tablet (300 mg total) by mouth daily. 90 tablet 6  . apixaban (ELIQUIS) 2.5 MG TABS tablet Take 1 tablet (2.5 mg total) by mouth 2 (two) times daily. 60 tablet 3  . hydrochlorothiazide (HYDRODIURIL) 25 MG tablet Take 1 tablet (25 mg total) by mouth daily. 90 tablet 2   No current facility-administered medications for this visit.     REVIEW OF SYSTEMS:  [X]  denotes positive finding, [ ]  denotes negative finding Cardiac  Comments:  Chest pain or chest pressure:    Shortness of breath upon exertion:    Short of breath when lying flat:    Irregular heart rhythm:        Vascular    Pain in calf, thigh, or hip brought on by ambulation:    Pain in feet at night that wakes you up from your sleep:     Blood clot in your veins:    Leg swelling:           PHYSICAL EXAM: Vitals:   07/15/18  1006  BP: (!) 147/95  Pulse: 66  Resp: 20  Temp: 97.6 F (36.4 C)  TempSrc: Oral  SpO2: 99%  Weight: 264 lb (119.7 kg)  Height: 6' (1.829 m)    GENERAL: The patient is a well-nourished male, in no acute distress. The vital signs are documented above. CARDIOVASCULAR: 2+ radial pulses bilaterally.  He does have a thrombosed left cephalic vein fistula. PULMONARY: There is good air exchange  MUSCULOSKELETAL: There are no major deformities or cyanosis. NEUROLOGIC: No focal weakness or paresthesias are detected. SKIN: There are no ulcers or rashes noted. PSYCHIATRIC: The patient has a normal affect.  DATA:  Duplex showed acceptable left basilic vein in the upper arm for fistula attempt  His veins with SonoSite ultrasound and these are patent and are of adequate caliber.  MEDICAL ISSUES: Progressive renal insufficiency.  Have recommended left brachiocephalic AV  fistula patient.  Does have lupus anticoagulant and will hold his Eliquis for 2 days prior to fistula creation and then resume following    Rosetta Posner, MD [facility] Tel [phone] Pager [phone]

## 2018-08-01 ENCOUNTER — Ambulatory Visit (HOSPITAL_COMMUNITY)
Admission: RE | Admit: 2018-08-01 | Discharge: 2018-08-01 | Disposition: A | Discharge status: Home / Self Care | Payer: BLUE CROSS/BLUE SHIELD | Source: Ambulatory Visit | Attending: Nephrology | Admitting: Nephrology

## 2018-08-01 VITALS — BP 151/102 | HR 67 | Temp 98.4°F | Resp 20

## 2018-08-01 DIAGNOSIS — N186 End stage renal disease: Secondary | ICD-10-CM | POA: Diagnosis not present

## 2018-08-01 DIAGNOSIS — N183 Chronic kidney disease, stage 3 unspecified: Secondary | ICD-10-CM

## 2018-08-01 LAB — IRON AND TIBC
Iron: 83 ug/dL (ref 45–182)
Saturation Ratios: 33 % (ref 17.9–39.5)
TIBC: 248 ug/dL — AB (ref 250–450)
UIBC: 165 ug/dL

## 2018-08-01 LAB — FERRITIN: Ferritin: 239 ng/mL (ref 24–336)

## 2018-08-01 LAB — POCT HEMOGLOBIN-HEMACUE: Hemoglobin: 10.1 g/dL — ABNORMAL LOW (ref 13.0–17.0)

## 2018-08-01 MED ORDER — EPOETIN ALFA-EPBX 10000 UNIT/ML IJ SOLN
20000.0000 [IU] | INTRAMUSCULAR | Status: DC
  Administered 2018-08-01: 20000 [IU] via SUBCUTANEOUS
  Filled 2018-08-01: qty 2

## 2018-08-14 ENCOUNTER — Encounter (HOSPITAL_COMMUNITY): Payer: Self-pay

## 2018-08-14 MED ORDER — DEXTROSE 5 % IV SOLN
3.0000 g | INTRAVENOUS | Status: AC
  Administered 2018-08-15: 3 g via INTRAVENOUS
  Filled 2018-08-14: qty 3

## 2018-08-15 ENCOUNTER — Ambulatory Visit (HOSPITAL_COMMUNITY): Payer: BLUE CROSS/BLUE SHIELD | Admitting: Certified Registered"

## 2018-08-15 ENCOUNTER — Telehealth: Payer: Self-pay | Admitting: Vascular Surgery

## 2018-08-15 ENCOUNTER — Encounter (HOSPITAL_COMMUNITY): Payer: Self-pay

## 2018-08-15 ENCOUNTER — Encounter (HOSPITAL_COMMUNITY)
Admission: RE | Disposition: A | Discharge status: Home / Self Care | Payer: Self-pay | Source: Home / Self Care | Attending: Vascular Surgery

## 2018-08-15 ENCOUNTER — Other Ambulatory Visit: Payer: Self-pay

## 2018-08-15 ENCOUNTER — Ambulatory Visit (HOSPITAL_COMMUNITY)
Admission: RE | Admit: 2018-08-15 | Discharge: 2018-08-15 | Disposition: A | Discharge status: Home / Self Care | Payer: BLUE CROSS/BLUE SHIELD | Attending: Vascular Surgery | Admitting: Vascular Surgery

## 2018-08-15 DIAGNOSIS — D6862 Lupus anticoagulant syndrome: Secondary | ICD-10-CM | POA: Insufficient documentation

## 2018-08-15 DIAGNOSIS — Z7901 Long term (current) use of anticoagulants: Secondary | ICD-10-CM | POA: Diagnosis not present

## 2018-08-15 DIAGNOSIS — Z8673 Personal history of transient ischemic attack (TIA), and cerebral infarction without residual deficits: Secondary | ICD-10-CM | POA: Diagnosis not present

## 2018-08-15 DIAGNOSIS — N186 End stage renal disease: Secondary | ICD-10-CM | POA: Insufficient documentation

## 2018-08-15 DIAGNOSIS — Z79899 Other long term (current) drug therapy: Secondary | ICD-10-CM | POA: Diagnosis not present

## 2018-08-15 DIAGNOSIS — M329 Systemic lupus erythematosus, unspecified: Secondary | ICD-10-CM | POA: Diagnosis not present

## 2018-08-15 DIAGNOSIS — I120 Hypertensive chronic kidney disease with stage 5 chronic kidney disease or end stage renal disease: Secondary | ICD-10-CM | POA: Diagnosis not present

## 2018-08-15 DIAGNOSIS — R569 Unspecified convulsions: Secondary | ICD-10-CM | POA: Diagnosis not present

## 2018-08-15 DIAGNOSIS — Z87891 Personal history of nicotine dependence: Secondary | ICD-10-CM | POA: Diagnosis not present

## 2018-08-15 DIAGNOSIS — N184 Chronic kidney disease, stage 4 (severe): Secondary | ICD-10-CM | POA: Diagnosis not present

## 2018-08-15 HISTORY — PX: BASCILIC VEIN TRANSPOSITION: SHX5742

## 2018-08-15 LAB — POCT I-STAT 4, (NA,K, GLUC, HGB,HCT)
Glucose, Bld: 101 mg/dL — ABNORMAL HIGH (ref 70–99)
HCT: 34 % — ABNORMAL LOW (ref 39.0–52.0)
Hemoglobin: 11.6 g/dL — ABNORMAL LOW (ref 13.0–17.0)
POTASSIUM: 4 mmol/L (ref 3.5–5.1)
SODIUM: 137 mmol/L (ref 135–145)

## 2018-08-15 SURGERY — TRANSPOSITION, VEIN, BASILIC
Anesthesia: General | Site: Arm Upper | Laterality: Left

## 2018-08-15 MED ORDER — FENTANYL CITRATE (PF) 250 MCG/5ML IJ SOLN
INTRAMUSCULAR | Status: AC
  Filled 2018-08-15: qty 5

## 2018-08-15 MED ORDER — SODIUM CHLORIDE 0.9 % IV SOLN
INTRAVENOUS | Status: DC | PRN
  Administered 2018-08-15: 11:00:00

## 2018-08-15 MED ORDER — LACTATED RINGERS IV SOLN: INTRAVENOUS | Status: DC

## 2018-08-15 MED ORDER — SODIUM CHLORIDE 0.9 % IV SOLN
INTRAVENOUS | Status: DC
  Administered 2018-08-15: 09:00:00 via INTRAVENOUS

## 2018-08-15 MED ORDER — HYDROCODONE-ACETAMINOPHEN 5-325 MG PO TABS: 1.0000 | ORAL_TABLET | Freq: Four times a day (QID) | ORAL | 0 refills | Status: DC | PRN

## 2018-08-15 MED ORDER — EPHEDRINE 5 MG/ML INJ
INTRAVENOUS | Status: AC
  Filled 2018-08-15: qty 10

## 2018-08-15 MED ORDER — PROPOFOL 10 MG/ML IV BOLUS
INTRAVENOUS | Status: AC
  Filled 2018-08-15: qty 20

## 2018-08-15 MED ORDER — CHLORHEXIDINE GLUCONATE CLOTH 2 % EX PADS: 6.0000 | MEDICATED_PAD | Freq: Once | CUTANEOUS | Status: DC

## 2018-08-15 MED ORDER — PHENYLEPHRINE 40 MCG/ML (10ML) SYRINGE FOR IV PUSH (FOR BLOOD PRESSURE SUPPORT)
PREFILLED_SYRINGE | INTRAVENOUS | Status: AC
  Filled 2018-08-15: qty 10

## 2018-08-15 MED ORDER — SODIUM CHLORIDE 0.9 % IV SOLN
INTRAVENOUS | Status: AC
  Filled 2018-08-15: qty 1.2

## 2018-08-15 MED ORDER — PHENYLEPHRINE 40 MCG/ML (10ML) SYRINGE FOR IV PUSH (FOR BLOOD PRESSURE SUPPORT)
PREFILLED_SYRINGE | INTRAVENOUS | Status: DC | PRN
  Administered 2018-08-15: 160 ug via INTRAVENOUS
  Administered 2018-08-15: 80 ug via INTRAVENOUS
  Administered 2018-08-15: 160 ug via INTRAVENOUS

## 2018-08-15 MED ORDER — MIDAZOLAM HCL 2 MG/2ML IJ SOLN
INTRAMUSCULAR | Status: AC
  Filled 2018-08-15: qty 2

## 2018-08-15 MED ORDER — LIDOCAINE-EPINEPHRINE (PF) 1 %-1:200000 IJ SOLN
INTRAMUSCULAR | Status: AC
  Filled 2018-08-15: qty 30

## 2018-08-15 MED ORDER — FENTANYL CITRATE (PF) 250 MCG/5ML IJ SOLN
INTRAMUSCULAR | Status: DC | PRN
  Administered 2018-08-15 (×3): 50 ug via INTRAVENOUS

## 2018-08-15 MED ORDER — EPHEDRINE SULFATE-NACL 50-0.9 MG/10ML-% IV SOSY
PREFILLED_SYRINGE | INTRAVENOUS | Status: DC | PRN
  Administered 2018-08-15 (×2): 10 mg via INTRAVENOUS

## 2018-08-15 MED ORDER — METOCLOPRAMIDE HCL 5 MG/ML IJ SOLN: 10.0000 mg | Freq: Once | INTRAMUSCULAR | Status: DC | PRN

## 2018-08-15 MED ORDER — 0.9 % SODIUM CHLORIDE (POUR BTL) OPTIME
TOPICAL | Status: DC | PRN
  Administered 2018-08-15: 1000 mL

## 2018-08-15 MED ORDER — LIDOCAINE-EPINEPHRINE 0.5 %-1:200000 IJ SOLN
INTRAMUSCULAR | Status: AC
  Filled 2018-08-15: qty 1

## 2018-08-15 MED ORDER — SODIUM CHLORIDE 0.9 % IV SOLN
INTRAVENOUS | Status: DC | PRN
  Administered 2018-08-15: 25 ug/min via INTRAVENOUS

## 2018-08-15 MED ORDER — HYDROCODONE-ACETAMINOPHEN 5-325 MG PO TABS: 1.0000 | ORAL_TABLET | ORAL | 0 refills | Status: DC | PRN

## 2018-08-15 MED ORDER — FENTANYL CITRATE (PF) 100 MCG/2ML IJ SOLN
INTRAMUSCULAR | Status: AC
  Filled 2018-08-15: qty 2

## 2018-08-15 MED ORDER — PROPOFOL 10 MG/ML IV BOLUS
INTRAVENOUS | Status: DC | PRN
  Administered 2018-08-15: 200 mg via INTRAVENOUS

## 2018-08-15 MED ORDER — FENTANYL CITRATE (PF) 100 MCG/2ML IJ SOLN
25.0000 ug | INTRAMUSCULAR | Status: DC | PRN
  Administered 2018-08-15 (×3): 25 ug via INTRAVENOUS

## 2018-08-15 MED ORDER — MIDAZOLAM HCL 2 MG/2ML IJ SOLN
INTRAMUSCULAR | Status: DC | PRN
  Administered 2018-08-15: 2 mg via INTRAVENOUS

## 2018-08-15 MED ORDER — LIDOCAINE 2% (20 MG/ML) 5 ML SYRINGE
INTRAMUSCULAR | Status: DC | PRN
  Administered 2018-08-15: 100 mg via INTRAVENOUS

## 2018-08-15 MED ORDER — ONDANSETRON HCL 4 MG/2ML IJ SOLN
INTRAMUSCULAR | Status: AC
  Filled 2018-08-15: qty 2

## 2018-08-15 MED ORDER — MEPERIDINE HCL 50 MG/ML IJ SOLN: 6.2500 mg | INTRAMUSCULAR | Status: DC | PRN

## 2018-08-15 MED ORDER — LIDOCAINE 2% (20 MG/ML) 5 ML SYRINGE
INTRAMUSCULAR | Status: AC
  Filled 2018-08-15: qty 5

## 2018-08-15 SURGICAL SUPPLY — 37 items
ADH SKN CLS APL DERMABOND .7 (GAUZE/BANDAGES/DRESSINGS) ×1
ARMBAND PINK RESTRICT EXTREMIT (MISCELLANEOUS) ×2 IMPLANT
CANISTER SUCT 3000ML PPV (MISCELLANEOUS) ×2 IMPLANT
CANNULA VESSEL 3MM 2 BLNT TIP (CANNULA) ×2 IMPLANT
CLIP LIGATING EXTRA MED SLVR (CLIP) ×2 IMPLANT
CLIP LIGATING EXTRA SM BLUE (MISCELLANEOUS) ×2 IMPLANT
COVER PROBE W GEL 5X96 (DRAPES) ×2 IMPLANT
COVER WAND RF STERILE (DRAPES) ×2 IMPLANT
DECANTER SPIKE VIAL GLASS SM (MISCELLANEOUS) ×2 IMPLANT
DERMABOND ADVANCED (GAUZE/BANDAGES/DRESSINGS) ×1
DERMABOND ADVANCED .7 DNX12 (GAUZE/BANDAGES/DRESSINGS) ×1 IMPLANT
ELECT REM PT RETURN 9FT ADLT (ELECTROSURGICAL) ×2
ELECTRODE REM PT RTRN 9FT ADLT (ELECTROSURGICAL) ×1 IMPLANT
GLOVE BIO SURGEON STRL SZ 6.5 (GLOVE) ×2 IMPLANT
GLOVE BIOGEL M 6.5 STRL (GLOVE) ×1 IMPLANT
GLOVE BIOGEL PI IND STRL 6.5 (GLOVE) IMPLANT
GLOVE BIOGEL PI IND STRL 7.0 (GLOVE) IMPLANT
GLOVE BIOGEL PI INDICATOR 6.5 (GLOVE) ×1
GLOVE BIOGEL PI INDICATOR 7.0 (GLOVE) ×4
GLOVE SS BIOGEL STRL SZ 7.5 (GLOVE) ×1 IMPLANT
GLOVE SUPERSENSE BIOGEL SZ 7.5 (GLOVE) ×1
GOWN STRL REUS W/ TWL LRG LVL3 (GOWN DISPOSABLE) ×3 IMPLANT
GOWN STRL REUS W/ TWL XL LVL3 (GOWN DISPOSABLE) IMPLANT
GOWN STRL REUS W/TWL LRG LVL3 (GOWN DISPOSABLE) ×6
GOWN STRL REUS W/TWL XL LVL3 (GOWN DISPOSABLE) ×2
KIT BASIN OR (CUSTOM PROCEDURE TRAY) ×2 IMPLANT
KIT TURNOVER KIT B (KITS) ×2 IMPLANT
NS IRRIG 1000ML POUR BTL (IV SOLUTION) ×2 IMPLANT
PACK CV ACCESS (CUSTOM PROCEDURE TRAY) ×2 IMPLANT
PAD ARMBOARD 7.5X6 YLW CONV (MISCELLANEOUS) ×4 IMPLANT
SUT PROLENE 6 0 CC (SUTURE) ×3 IMPLANT
SUT SILK 2 0 SH (SUTURE) IMPLANT
SUT VIC AB 3-0 SH 27 (SUTURE) ×2
SUT VIC AB 3-0 SH 27X BRD (SUTURE) ×1 IMPLANT
TOWEL GREEN STERILE (TOWEL DISPOSABLE) ×2 IMPLANT
UNDERPAD 30X30 (UNDERPADS AND DIAPERS) ×2 IMPLANT
WATER STERILE IRR 1000ML POUR (IV SOLUTION) ×2 IMPLANT

## 2018-08-15 NOTE — Telephone Encounter (Signed)
sch appt spk to pt mld ltr

## 2018-08-15 NOTE — Op Note (Signed)
    OPERATIVE REPORT  DATE OF SURGERY: 08/15/2018  PATIENT: [name] [name], 38 y.o. male MRN: [id number]  DOB: 1981-01-03  PRE-OPERATIVE DIAGNOSIS: Chronic renal insufficiency  POST-OPERATIVE DIAGNOSIS:  Same  PROCEDURE: Left first stage brachiobasilic fistula  SURGEON:  Curt Jews, M.D.  PHYSICIAN ASSISTANT: Matt Eveland, PA-C  ANESTHESIA: LMA  EBL: per anesthesia record  Total I/O In: -  Out: 20 [Blood:20]  BLOOD ADMINISTERED: none  DRAINS: none  SPECIMEN: none  COUNTS CORRECT:  YES  PATIENT DISPOSITION:  PACU - hemodynamically stable  PROCEDURE DETAILS: The patient was taken the operating placed supine [place] area of the left arm draped you sterile fashion.  SonoSite ultrasound was used to visualize the basilic vein.  The patient had a large caliber basilic vein above the elbow.  There were multiple branches just above the elbow.  Incision was made longitudinally over the basilic vein.  The largest of these branches was chosen.  It was mobilized distally and ligated distally and divided.  The other tributary branches were ligated and divided.  The brachial artery was exposed through the same incision and was of good caliber.  The artery was occluded proximally and distally and was opened with an 11 blade and sent longitudinally with Potts scissors.  The vein was spatulated and sewn end-to-side to the artery with a running 6-0 Prolene suture.  Clamps were removed and excellent thrill was noted.  The wounds were irrigated with saline.  Hemostasis was obtained with electrocautery.  The wounds were closed with 3-0 Vicryl in the subcutaneous and subcuticular tissue.  Sterile dressing was applied.  The patient had a palpable left radial pulse and was transferred to the recovery room in stable condition   Rosetta Posner, M.D., [facility] 08/15/2018 11:38 AM

## 2018-08-15 NOTE — Anesthesia Procedure Notes (Signed)
Procedure Name: LMA Insertion Date/Time: 08/15/2018 10:16 AM Performed by: Barrington Ellison, CRNA Pre-anesthesia Checklist: Patient identified, Emergency Drugs available, Suction available and Patient being monitored Patient Re-evaluated:Patient Re-evaluated prior to induction Oxygen Delivery Method: Circle System Utilized Preoxygenation: Pre-oxygenation with 100% oxygen Induction Type: IV induction Ventilation: Mask ventilation without difficulty LMA: LMA inserted LMA Size: 5.0 Number of attempts: 1 Placement Confirmation: positive ETCO2 Tube secured with: Tape Dental Injury: Teeth and Oropharynx as per pre-operative assessment

## 2018-08-15 NOTE — Anesthesia Postprocedure Evaluation (Signed)
Anesthesia Post Note  Patient: [name] [name]  Procedure(s) Performed: LEFT FIRST STAGE [place] (Left Arm Upper)     Patient location during evaluation: PACU Anesthesia Type: General Level of consciousness: awake and alert Pain management: pain level controlled Vital Signs Assessment: post-procedure vital signs reviewed and stable Respiratory status: spontaneous breathing, nonlabored ventilation, respiratory function stable and patient connected to nasal cannula oxygen Cardiovascular status: blood pressure returned to baseline and stable Postop Assessment: no apparent nausea or vomiting Anesthetic complications: no    Last Vitals:  Vitals:   08/15/18 1245 08/15/18 1315  BP: 125/85 120/81  Pulse: 78 80  Resp: 13 16  Temp:  36.7 C  SpO2: 97% 99%    Last Pain:  Vitals:   08/15/18 1316  TempSrc:   PainSc: 0-No pain                 Montez Hageman

## 2018-08-15 NOTE — Transfer of Care (Signed)
Immediate Anesthesia Transfer of Care Note  Patient: [name] [name]  Procedure(s) Performed: LEFT FIRST STAGE BASCILIC VEIN TRANSPOSITION (Left Arm Upper)  Patient Location: PACU  Anesthesia Type:General  Level of Consciousness: drowsy and patient cooperative  Airway & Oxygen Therapy: Patient Spontanous Breathing  Post-op Assessment: Report given to RN  Post vital signs: Reviewed and stable  Last Vitals:  Vitals Value Taken Time  BP    Temp    Pulse 80 08/15/2018 11:42 AM  Resp    SpO2 98 % 08/15/2018 11:42 AM  Vitals shown include unvalidated device data.  Last Pain:  Vitals:   08/15/18 0857  TempSrc:   PainSc: 0-No pain         Complications: No apparent anesthesia complications

## 2018-08-15 NOTE — Telephone Encounter (Signed)
-----   Message from Dagoberto Ligas, PA-C sent at 08/15/2018 11:33 AM EST -----  Can you schedule an appt for this pt in 5-6 weeks for fistula duplex on PA/NP clinic.  PO 1st stage basilic fistula. Thanks, [facility]

## 2018-08-15 NOTE — Telephone Encounter (Signed)
sch appt spk to pt mld ltr 09/30/2018 3pm Dialysis Duplex 345pm p/o NP/PA

## 2018-08-15 NOTE — Discharge Instructions (Signed)
° °  [facility] [place] ° °Discharge Instructions ° °AV Fistula or Graft Surgery for Dialysis Access ° °Please refer to the following instructions for your post-procedure care. Your surgeon or physician assistant will discuss any changes with you. ° °Activity ° °You may drive the day following your surgery, if you are comfortable and no longer taking prescription pain medication. Resume full activity as the soreness in your incision resolves. ° °Bathing/Showering ° °You may shower after you go home. Keep your incision dry for 48 hours. Do not soak in a bathtub, hot tub, or swim until the incision heals completely. You may not shower if you have a hemodialysis catheter. ° °Incision Care ° °Clean your incision with mild soap and water after 48 hours. Pat the area dry with a clean towel. You do not need a bandage unless otherwise instructed. Do not apply any ointments or creams to your incision. You may have skin glue on your incision. Do not peel it off. It will come off on its own in about one week. Your arm may swell a bit after surgery. To reduce swelling use pillows to elevate your arm so it is above your heart. Your doctor will tell you if you need to lightly wrap your arm with an ACE bandage. ° °Diet ° °Resume your normal diet. There are not special food restrictions following this procedure. In order to heal from your surgery, it is CRITICAL to get adequate nutrition. Your body requires vitamins, minerals, and protein. Vegetables are the best source of vitamins and minerals. Vegetables also provide the perfect balance of protein. Processed food has little nutritional value, so try to avoid this. ° °Medications ° °Resume taking all of your medications. If your incision is causing pain, you may take over-the counter pain relievers such as acetaminophen (Tylenol). If you were prescribed a stronger pain medication, please be aware these medications can cause nausea and constipation. Prevent  nausea by taking the medication with a snack or meal. Avoid constipation by drinking plenty of fluids and eating foods with high amount of fiber, such as fruits, vegetables, and grains. Do not take Tylenol if you are taking prescription pain medications. ° ° ° ° °Follow up °Your surgeon may want to see you in the office following your access surgery. If so, this will be arranged at the time of your surgery. ° °Please call us immediately for any of the following conditions: ° °Increased pain, redness, drainage (pus) from your incision site °Fever of 101 degrees or higher °Severe or worsening pain at your incision site °Hand pain or numbness. ° °Reduce your risk of vascular disease: ° °Stop smoking. If you would like help, call [facility] at 1-800-QUIT-NOW (1-800-784-8669) or [place] at 336-586-4000 ° °Manage your cholesterol °Maintain a desired weight °Control your diabetes °Keep your blood pressure down ° °Dialysis ° °It will take several weeks to several months for your new dialysis access to be ready for use. Your surgeon will determine when it is OK to use it. Your nephrologist will continue to direct your dialysis. You can continue to use your Permcath until your new access is ready for use. ° °If you have any questions, please call the office at [phone]. ° °

## 2018-08-15 NOTE — Telephone Encounter (Signed)
Replacement narcotic prescription requested by pharmacy.  Ruta Hinds, MD [facility] [place] Office: [phone] Pager: [phone]

## 2018-08-15 NOTE — H&P (Signed)
Office Visit   07/15/2018 [facility][name] [name], [name] Meres, MD  Vascular Surgery   Chronic renal insufficiency, stage 4 (severe) ([place])  Dx   Follow-up   ; Referred by [name] Pounds, NP  Reason for Visit   Additional Documentation   Vitals:   BP 147/95 (BP Location: Right Arm, Patient Position: Sitting, Cuff Size: Normal)    Pulse 66   Temp 97.6 F (36.4 C) (Oral)   Resp 20   Ht 6' (1.829 m)   Wt 119.7 kg   SpO2 99%   BMI 35.80 kg/m   BSA 2.47 m   Flowsheets:   Vital Signs,   MEWS Score,   Anthropometrics,   Clinical Intake,   Healthcare Directives     Encounter Info:   Billing Info,   History,   Allergies,   Detailed Report     All Notes   Progress Notes by [name] Posner, MD at 07/15/2018 10:00 AM  Author: Rosetta Posner, MD Author Type: Physician Filed: 07/15/2018 11:58 AM  Note Status: Signed Cosign: Cosign Not Required Encounter Date: 07/15/2018  Editor: [name] Posner, MD (Physician)                                          [facility]  Patient name: [name] [name]            MRN: [id number]        DOB: 03/29/1981        Sex: male  REASON FOR VISIT: Discuss access for hemodialysis  HPI: [name] [name] is a 38 y.o. male here today for discussion of hemodialysis access.  He is well-known to me from prior left brachiocephalic AV fistula creation on 12/30/2017.  He had good initial maturation of this.  Although the fistula was not being used, he had spontaneous thrombosis of this.  He is here today for discussion of new access.  He had undergone a duplex in November for preparation of this but elected not to proceed.  I have this for review      Past Medical History:  Diagnosis Date  . Chronic kidney disease   . Hypertension   . Lupus ([place])   . Seizures ([place])   . Seizures ([place])   . Stroke ([place])   . Stroke [name])    47 or 38 years of age.           Family History  Problem  Relation Age of Onset  . Diabetes Mother   . Diabetes Maternal Grandmother     SOCIAL HISTORY: Social History        Tobacco Use  . Smoking status: Former Smoker    Packs/day: 0.25    Types: Cigarettes    Last attempt to quit: 12/21/2017    Years since quitting: 0.5  . Smokeless tobacco: Never Used  Substance Use Topics  . Alcohol use: Not Currently         Allergies  Allergen Reactions  . Zyrtec [Cetirizine]     Facial swelling          Current Outpatient Medications  Medication Sig Dispense Refill  . amLODipine (NORVASC) 10 MG tablet Take 1 tablet (10 mg total) by mouth daily. 90 tablet 3  . atorvastatin (LIPITOR) 40 MG tablet Take 1 tablet (40 mg total) by  mouth daily. 90 tablet 3  . calcitRIOL (ROCALTROL) 0.25 MCG capsule Take 1 capsule (0.25 mcg total) by mouth daily. 30 capsule 2  . calcium acetate (PHOSLO) 667 MG capsule Take 2 capsules (1,334 mg total) by mouth 3 (three) times daily with meals. Take extra 1 capsule with snacks 90 capsule 3  . carvedilol (COREG) 3.125 MG tablet Take 1 tablet (3.125 mg total) by mouth 2 (two) times daily with a meal. 60 tablet 1  . cephALEXin (KEFLEX) 500 MG capsule Take 1 capsule (500 mg total) by mouth 2 (two) times daily. 20 capsule 0  . LamoTRIgine (LAMICTAL XR) 300 MG TB24 24 hour tablet Take 1 tablet (300 mg total) by mouth daily. 90 tablet 6  . apixaban (ELIQUIS) 2.5 MG TABS tablet Take 1 tablet (2.5 mg total) by mouth 2 (two) times daily. 60 tablet 3  . hydrochlorothiazide (HYDRODIURIL) 25 MG tablet Take 1 tablet (25 mg total) by mouth daily. 90 tablet 2   No current facility-administered medications for this visit.     REVIEW OF SYSTEMS:  [X]  denotes positive finding, [ ]  denotes negative finding Cardiac  Comments:  Chest pain or chest pressure:    Shortness of breath upon exertion:    Short of breath when lying flat:    Irregular heart rhythm:        Vascular    Pain in calf,  thigh, or hip brought on by ambulation:    Pain in feet at night that wakes you up from your sleep:     Blood clot in your veins:    Leg swelling:           PHYSICAL EXAM:    Vitals:   07/15/18 1006  BP: (!) 147/95  Pulse: 66  Resp: 20  Temp: 97.6 F (36.4 C)  TempSrc: Oral  SpO2: 99%  Weight: 264 lb (119.7 kg)  Height: 6' (1.829 m)    GENERAL: The patient is a well-nourished male, in no acute distress. The vital signs are documented above. CARDIOVASCULAR: 2+ radial pulses bilaterally.  He does have a thrombosed left cephalic vein fistula. PULMONARY: There is good air exchange  MUSCULOSKELETAL: There are no major deformities or cyanosis. NEUROLOGIC: No focal weakness or paresthesias are detected. SKIN: There are no ulcers or rashes noted. PSYCHIATRIC: The patient has a normal affect.  DATA:  Duplex showed acceptable left basilic vein in the upper arm for fistula attempt  His veins with SonoSite ultrasound and these are patent and are of adequate caliber.  MEDICAL ISSUES: Progressive renal insufficiency.  Have recommended left brachiocephalic AV fistula patient.  Does have lupus anticoagulant and will hold his Eliquis for 2 days prior to fistula creation and then resume following    [name] Posner, MD FACS [facility] [phone] Pager [phone]     Addendum:  The patient has been re-examined and re-evaluated.  The patient's history and physical has been reviewed and is unchanged.    [name] [name] is a 38 y.o. male is being admitted with END STAGE RENAL DISEASE. All the risks, benefits and other treatment options have been discussed with the patient. The patient has consented to proceed with Procedure(s): LEFT FIRST STAGE [place] as a surgical intervention.  [name] [name] 08/15/2018 9:55 AM [facility]

## 2018-08-15 NOTE — Anesthesia Preprocedure Evaluation (Signed)
Anesthesia Evaluation  Patient identified by MRN, date of birth, ID band Patient awake    Reviewed: Allergy & Precautions, NPO status , Patient's Chart, lab work & pertinent test results  Airway Mallampati: II  TM Distance: >3 FB Neck ROM: Full    Dental no notable dental hx.    Pulmonary former smoker,    Pulmonary exam normal breath sounds clear to auscultation       Cardiovascular hypertension, Pt. on medications Normal cardiovascular exam Rhythm:Regular Rate:Normal     Neuro/Psych negative neurological ROS  negative psych ROS   GI/Hepatic negative GI ROS, Neg liver ROS,   Endo/Other  negative endocrine ROS  Renal/GU ESRFRenal disease  negative genitourinary   Musculoskeletal negative musculoskeletal ROS (+)   Abdominal   Peds negative pediatric ROS (+)  Hematology negative hematology ROS (+)   Anesthesia Other Findings   Reproductive/Obstetrics negative OB ROS                             Anesthesia Physical Anesthesia Plan  ASA: III  Anesthesia Plan: General   Post-op Pain Management:    Induction: Intravenous  PONV Risk Score and Plan: 2 and Ondansetron and Treatment may vary due to age or medical condition  Airway Management Planned: LMA  Additional Equipment:   Intra-op Plan:   Post-operative Plan: Extubation in OR  Informed Consent: I have reviewed the patients History and Physical, chart, labs and discussed the procedure including the risks, benefits and alternatives for the proposed anesthesia with the patient or authorized representative who has indicated his/her understanding and acceptance.     Dental advisory given  Plan Discussed with: CRNA  Anesthesia Plan Comments:         Anesthesia Quick Evaluation

## 2018-08-16 ENCOUNTER — Encounter (HOSPITAL_COMMUNITY): Payer: Self-pay | Admitting: Vascular Surgery

## 2018-08-29 ENCOUNTER — Other Ambulatory Visit: Payer: Self-pay

## 2018-08-29 ENCOUNTER — Ambulatory Visit (HOSPITAL_COMMUNITY)
Admission: RE | Admit: 2018-08-29 | Discharge: 2018-08-29 | Disposition: A | Discharge status: Home / Self Care | Payer: BLUE CROSS/BLUE SHIELD | Source: Ambulatory Visit | Attending: Nephrology | Admitting: Nephrology

## 2018-08-29 VITALS — BP 155/99 | HR 75 | Temp 97.8°F | Resp 20

## 2018-08-29 DIAGNOSIS — N183 Chronic kidney disease, stage 3 unspecified: Secondary | ICD-10-CM

## 2018-08-29 DIAGNOSIS — N186 End stage renal disease: Secondary | ICD-10-CM | POA: Diagnosis not present

## 2018-08-29 LAB — IRON AND TIBC
Iron: 84 ug/dL (ref 45–182)
Saturation Ratios: 37 % (ref 17.9–39.5)
TIBC: 230 ug/dL — AB (ref 250–450)
UIBC: 146 ug/dL

## 2018-08-29 LAB — POCT HEMOGLOBIN-HEMACUE: Hemoglobin: 9.4 g/dL — ABNORMAL LOW (ref 13.0–17.0)

## 2018-08-29 LAB — FERRITIN: FERRITIN: 303 ng/mL (ref 24–336)

## 2018-08-29 MED ORDER — EPOETIN ALFA-EPBX 10000 UNIT/ML IJ SOLN
20000.0000 [IU] | INTRAMUSCULAR | Status: DC
  Administered 2018-08-29: 20000 [IU] via SUBCUTANEOUS
  Filled 2018-08-29: qty 2

## 2018-09-20 ENCOUNTER — Inpatient Hospital Stay (HOSPITAL_COMMUNITY): Payer: BLUE CROSS/BLUE SHIELD

## 2018-09-20 ENCOUNTER — Encounter (HOSPITAL_COMMUNITY)
Admission: EM | Disposition: A | Discharge status: Home / Self Care | Payer: Self-pay | Source: Home / Self Care | Attending: Internal Medicine

## 2018-09-20 ENCOUNTER — Encounter (HOSPITAL_COMMUNITY): Payer: Self-pay | Admitting: Emergency Medicine

## 2018-09-20 ENCOUNTER — Inpatient Hospital Stay (HOSPITAL_COMMUNITY): Payer: BLUE CROSS/BLUE SHIELD | Admitting: Anesthesiology

## 2018-09-20 ENCOUNTER — Inpatient Hospital Stay (HOSPITAL_COMMUNITY)
Admission: EM | Admit: 2018-09-20 | Discharge: 2018-09-23 | DRG: 674 | Disposition: A | Discharge status: Home / Self Care | Payer: BLUE CROSS/BLUE SHIELD | Attending: Internal Medicine | Admitting: Internal Medicine

## 2018-09-20 DIAGNOSIS — G40909 Epilepsy, unspecified, not intractable, without status epilepticus: Secondary | ICD-10-CM | POA: Diagnosis not present

## 2018-09-20 DIAGNOSIS — S01512A Laceration without foreign body of oral cavity, initial encounter: Secondary | ICD-10-CM | POA: Diagnosis not present

## 2018-09-20 DIAGNOSIS — Z8673 Personal history of transient ischemic attack (TIA), and cerebral infarction without residual deficits: Secondary | ICD-10-CM

## 2018-09-20 DIAGNOSIS — T426X6A Underdosing of other antiepileptic and sedative-hypnotic drugs, initial encounter: Secondary | ICD-10-CM | POA: Diagnosis present

## 2018-09-20 DIAGNOSIS — N179 Acute kidney failure, unspecified: Principal | ICD-10-CM | POA: Diagnosis present

## 2018-09-20 DIAGNOSIS — Z992 Dependence on renal dialysis: Secondary | ICD-10-CM | POA: Diagnosis not present

## 2018-09-20 DIAGNOSIS — E872 Acidosis: Secondary | ICD-10-CM

## 2018-09-20 DIAGNOSIS — R404 Transient alteration of awareness: Secondary | ICD-10-CM | POA: Diagnosis not present

## 2018-09-20 DIAGNOSIS — Z9114 Patient's other noncompliance with medication regimen: Secondary | ICD-10-CM | POA: Diagnosis not present

## 2018-09-20 DIAGNOSIS — Y92512 Supermarket, store or market as the place of occurrence of the external cause: Secondary | ICD-10-CM

## 2018-09-20 DIAGNOSIS — Z452 Encounter for adjustment and management of vascular access device: Secondary | ICD-10-CM | POA: Diagnosis not present

## 2018-09-20 DIAGNOSIS — I10 Essential (primary) hypertension: Secondary | ICD-10-CM | POA: Diagnosis not present

## 2018-09-20 DIAGNOSIS — J811 Chronic pulmonary edema: Secondary | ICD-10-CM | POA: Diagnosis not present

## 2018-09-20 DIAGNOSIS — N185 Chronic kidney disease, stage 5: Secondary | ICD-10-CM | POA: Diagnosis not present

## 2018-09-20 DIAGNOSIS — I120 Hypertensive chronic kidney disease with stage 5 chronic kidney disease or end stage renal disease: Secondary | ICD-10-CM | POA: Diagnosis present

## 2018-09-20 DIAGNOSIS — Z87891 Personal history of nicotine dependence: Secondary | ICD-10-CM

## 2018-09-20 DIAGNOSIS — R760 Raised antibody titer: Secondary | ICD-10-CM | POA: Diagnosis not present

## 2018-09-20 DIAGNOSIS — N186 End stage renal disease: Secondary | ICD-10-CM

## 2018-09-20 DIAGNOSIS — D631 Anemia in chronic kidney disease: Secondary | ICD-10-CM | POA: Diagnosis present

## 2018-09-20 DIAGNOSIS — M329 Systemic lupus erythematosus, unspecified: Secondary | ICD-10-CM | POA: Diagnosis not present

## 2018-09-20 DIAGNOSIS — Z91128 Patient's intentional underdosing of medication regimen for other reason: Secondary | ICD-10-CM

## 2018-09-20 DIAGNOSIS — Z833 Family history of diabetes mellitus: Secondary | ICD-10-CM

## 2018-09-20 DIAGNOSIS — R410 Disorientation, unspecified: Secondary | ICD-10-CM | POA: Diagnosis not present

## 2018-09-20 DIAGNOSIS — E8729 Other acidosis: Secondary | ICD-10-CM

## 2018-09-20 DIAGNOSIS — Z419 Encounter for procedure for purposes other than remedying health state, unspecified: Secondary | ICD-10-CM

## 2018-09-20 DIAGNOSIS — R569 Unspecified convulsions: Secondary | ICD-10-CM | POA: Diagnosis not present

## 2018-09-20 DIAGNOSIS — Z888 Allergy status to other drugs, medicaments and biological substances status: Secondary | ICD-10-CM

## 2018-09-20 DIAGNOSIS — E876 Hypokalemia: Secondary | ICD-10-CM | POA: Diagnosis present

## 2018-09-20 DIAGNOSIS — I129 Hypertensive chronic kidney disease with stage 1 through stage 4 chronic kidney disease, or unspecified chronic kidney disease: Secondary | ICD-10-CM | POA: Diagnosis not present

## 2018-09-20 DIAGNOSIS — D6862 Lupus anticoagulant syndrome: Secondary | ICD-10-CM | POA: Diagnosis present

## 2018-09-20 DIAGNOSIS — G40901 Epilepsy, unspecified, not intractable, with status epilepticus: Secondary | ICD-10-CM | POA: Diagnosis not present

## 2018-09-20 DIAGNOSIS — Z79899 Other long term (current) drug therapy: Secondary | ICD-10-CM | POA: Diagnosis not present

## 2018-09-20 DIAGNOSIS — S01511A Laceration without foreign body of lip, initial encounter: Secondary | ICD-10-CM | POA: Diagnosis present

## 2018-09-20 DIAGNOSIS — Z7901 Long term (current) use of anticoagulants: Secondary | ICD-10-CM | POA: Diagnosis not present

## 2018-09-20 DIAGNOSIS — N189 Chronic kidney disease, unspecified: Secondary | ICD-10-CM | POA: Diagnosis present

## 2018-09-20 HISTORY — PX: INSERTION OF DIALYSIS CATHETER: SHX1324

## 2018-09-20 LAB — COMPREHENSIVE METABOLIC PANEL
ALT: 15 U/L (ref 0–44)
AST: 15 U/L (ref 15–41)
Albumin: 3.3 g/dL — ABNORMAL LOW (ref 3.5–5.0)
Alkaline Phosphatase: 91 U/L (ref 38–126)
Anion gap: 19 — ABNORMAL HIGH (ref 5–15)
BUN: 116 mg/dL — ABNORMAL HIGH (ref 6–20)
CO2: 11 mmol/L — ABNORMAL LOW (ref 22–32)
Calcium: 8.4 mg/dL — ABNORMAL LOW (ref 8.9–10.3)
Chloride: 108 mmol/L (ref 98–111)
Creatinine, Ser: 22.28 mg/dL — ABNORMAL HIGH (ref 0.61–1.24)
GFR calc Af Amer: 3 mL/min — ABNORMAL LOW (ref >60)
GFR calc non Af Amer: 2 mL/min — ABNORMAL LOW (ref >60)
Glucose, Bld: 131 mg/dL — ABNORMAL HIGH (ref 70–99)
Potassium: 4 mmol/L (ref 3.5–5.1)
Sodium: 138 mmol/L (ref 135–145)
Total Bilirubin: 0.5 mg/dL (ref 0.3–1.2)
Total Protein: 7.7 g/dL (ref 6.5–8.1)

## 2018-09-20 LAB — CBC
HCT: 26.4 % — ABNORMAL LOW (ref 39.0–52.0)
Hemoglobin: 8.6 g/dL — ABNORMAL LOW (ref 13.0–17.0)
MCH: 30.2 pg (ref 26.0–34.0)
MCHC: 32.6 g/dL (ref 30.0–36.0)
MCV: 92.6 fL (ref 80.0–100.0)
Platelets: 133 10*3/uL — ABNORMAL LOW (ref 150–400)
RBC: 2.85 MIL/uL — ABNORMAL LOW (ref 4.22–5.81)
RDW: 14.6 % (ref 11.5–15.5)
WBC: 11.7 10*3/uL — ABNORMAL HIGH (ref 4.0–10.5)
nRBC: 0 % (ref 0.0–0.2)

## 2018-09-20 LAB — HEPARIN LEVEL (UNFRACTIONATED): Heparin Unfractionated: <0.1 IU/mL — ABNORMAL LOW (ref 0.30–0.70)

## 2018-09-20 LAB — CBG MONITORING, ED: Glucose-Capillary: 109 mg/dL — ABNORMAL HIGH (ref 70–99)

## 2018-09-20 LAB — HIV ANTIBODY (ROUTINE TESTING W REFLEX): HIV Screen 4th Generation wRfx: NONREACTIVE

## 2018-09-20 LAB — APTT: aPTT: 57 seconds — ABNORMAL HIGH (ref 24–36)

## 2018-09-20 SURGERY — INSERTION OF DIALYSIS CATHETER
Anesthesia: General

## 2018-09-20 MED ORDER — CALCIUM ACETATE (PHOS BINDER) 667 MG PO CAPS
667.0000 mg | ORAL_CAPSULE | Freq: Three times a day (TID) | ORAL | Status: DC
  Administered 2018-09-21 – 2018-09-23 (×7): 667 mg via ORAL
  Filled 2018-09-20 (×7): qty 1

## 2018-09-20 MED ORDER — SODIUM CHLORIDE 0.9 % IV SOLN
INTRAVENOUS | Status: AC
  Filled 2018-09-20: qty 1.2

## 2018-09-20 MED ORDER — LAMOTRIGINE 100 MG PO TABS
150.0000 mg | ORAL_TABLET | Freq: Two times a day (BID) | ORAL | Status: DC
  Administered 2018-09-20 – 2018-09-23 (×7): 150 mg via ORAL
  Filled 2018-09-20 (×9): qty 2

## 2018-09-20 MED ORDER — SODIUM CHLORIDE 0.9 % IV BOLUS
1000.0000 mL | Freq: Once | INTRAVENOUS | Status: AC
  Administered 2018-09-20: 1000 mL via INTRAVENOUS

## 2018-09-20 MED ORDER — CALCITRIOL 0.25 MCG PO CAPS
0.2500 ug | ORAL_CAPSULE | Freq: Every day | ORAL | Status: DC
  Administered 2018-09-21 – 2018-09-23 (×3): 0.25 ug via ORAL
  Filled 2018-09-20 (×4): qty 1

## 2018-09-20 MED ORDER — ONDANSETRON HCL 4 MG/2ML IJ SOLN: 4.0000 mg | Freq: Once | INTRAMUSCULAR | Status: DC | PRN

## 2018-09-20 MED ORDER — LIDOCAINE 2% (20 MG/ML) 5 ML SYRINGE
INTRAMUSCULAR | Status: DC | PRN
  Administered 2018-09-20: 60 mg via INTRAVENOUS

## 2018-09-20 MED ORDER — CHLORHEXIDINE GLUCONATE CLOTH 2 % EX PADS: 6.0000 | MEDICATED_PAD | Freq: Every day | CUTANEOUS | Status: DC

## 2018-09-20 MED ORDER — LAMOTRIGINE ER 300 MG PO TB24: 300.0000 mg | ORAL_TABLET | Freq: Every day | ORAL | Status: DC

## 2018-09-20 MED ORDER — ATORVASTATIN CALCIUM 40 MG PO TABS
40.0000 mg | ORAL_TABLET | Freq: Every evening | ORAL | Status: DC
  Administered 2018-09-20 – 2018-09-22 (×3): 40 mg via ORAL
  Filled 2018-09-20 (×3): qty 1

## 2018-09-20 MED ORDER — ONDANSETRON HCL 4 MG/2ML IJ SOLN: 4.0000 mg | Freq: Four times a day (QID) | INTRAMUSCULAR | Status: DC | PRN

## 2018-09-20 MED ORDER — MIDAZOLAM HCL 5 MG/5ML IJ SOLN
INTRAMUSCULAR | Status: DC | PRN
  Administered 2018-09-20: 2 mg via INTRAVENOUS

## 2018-09-20 MED ORDER — FENTANYL CITRATE (PF) 100 MCG/2ML IJ SOLN
25.0000 ug | INTRAMUSCULAR | Status: DC | PRN
  Administered 2018-09-20: 25 ug via INTRAVENOUS

## 2018-09-20 MED ORDER — ONDANSETRON HCL 4 MG/2ML IJ SOLN
INTRAMUSCULAR | Status: DC | PRN
  Administered 2018-09-20: 4 mg via INTRAVENOUS

## 2018-09-20 MED ORDER — HEPARIN SODIUM (PORCINE) 1000 UNIT/ML IJ SOLN
INTRAMUSCULAR | Status: AC
  Filled 2018-09-20: qty 1

## 2018-09-20 MED ORDER — SODIUM CHLORIDE 0.9 % IV SOLN
INTRAVENOUS | Status: DC | PRN
  Administered 2018-09-20: 50 ug/min via INTRAVENOUS

## 2018-09-20 MED ORDER — AMLODIPINE BESYLATE 10 MG PO TABS
10.0000 mg | ORAL_TABLET | Freq: Every day | ORAL | Status: DC
  Administered 2018-09-21 – 2018-09-23 (×3): 10 mg via ORAL
  Filled 2018-09-20 (×3): qty 1

## 2018-09-20 MED ORDER — SODIUM CHLORIDE 0.9 % IV SOLN
INTRAVENOUS | Status: DC
  Administered 2018-09-20: 10:00:00 via INTRAVENOUS

## 2018-09-20 MED ORDER — HEPARIN SODIUM (PORCINE) 1000 UNIT/ML IJ SOLN
INTRAMUSCULAR | Status: AC
  Filled 2018-09-20: qty 4

## 2018-09-20 MED ORDER — 0.9 % SODIUM CHLORIDE (POUR BTL) OPTIME
TOPICAL | Status: DC | PRN
  Administered 2018-09-20: 1000 mL

## 2018-09-20 MED ORDER — OXYCODONE HCL 5 MG/5ML PO SOLN: 5.0000 mg | Freq: Once | ORAL | Status: AC | PRN

## 2018-09-20 MED ORDER — HEPARIN SODIUM (PORCINE) 1000 UNIT/ML IJ SOLN
INTRAMUSCULAR | Status: DC | PRN
  Administered 2018-09-20: 3400 [IU]

## 2018-09-20 MED ORDER — LIDOCAINE 2% (20 MG/ML) 5 ML SYRINGE
INTRAMUSCULAR | Status: AC
  Filled 2018-09-20: qty 5

## 2018-09-20 MED ORDER — ACETAMINOPHEN 650 MG RE SUPP: 650.0000 mg | Freq: Four times a day (QID) | RECTAL | Status: DC | PRN

## 2018-09-20 MED ORDER — FENTANYL CITRATE (PF) 250 MCG/5ML IJ SOLN
INTRAMUSCULAR | Status: DC | PRN
  Administered 2018-09-20: 25 ug via INTRAVENOUS
  Administered 2018-09-20: 50 ug via INTRAVENOUS

## 2018-09-20 MED ORDER — DEXTROSE 5 % IV SOLN
3.0000 g | Freq: Once | INTRAVENOUS | Status: AC
  Administered 2018-09-20: 3 g via INTRAVENOUS
  Filled 2018-09-20: qty 3000

## 2018-09-20 MED ORDER — MIDAZOLAM HCL 2 MG/2ML IJ SOLN
INTRAMUSCULAR | Status: AC
  Filled 2018-09-20: qty 2

## 2018-09-20 MED ORDER — OXYCODONE HCL 5 MG PO TABS
ORAL_TABLET | ORAL | Status: AC
  Filled 2018-09-20: qty 1

## 2018-09-20 MED ORDER — PROPOFOL 10 MG/ML IV BOLUS
INTRAVENOUS | Status: DC | PRN
  Administered 2018-09-20: 200 mg via INTRAVENOUS

## 2018-09-20 MED ORDER — OXYCODONE HCL 5 MG PO TABS
5.0000 mg | ORAL_TABLET | Freq: Once | ORAL | Status: AC | PRN
  Administered 2018-09-20: 5 mg via ORAL

## 2018-09-20 MED ORDER — CALCIUM ACETATE (PHOS BINDER) 667 MG PO CAPS: 667.0000 mg | ORAL_CAPSULE | ORAL | Status: DC | PRN

## 2018-09-20 MED ORDER — ARTIFICIAL TEARS OPHTHALMIC OINT
TOPICAL_OINTMENT | OPHTHALMIC | Status: AC
  Filled 2018-09-20: qty 3.5

## 2018-09-20 MED ORDER — SODIUM CHLORIDE 0.9 % IV SOLN
INTRAVENOUS | Status: DC | PRN
  Administered 2018-09-20: 500 mL

## 2018-09-20 MED ORDER — PROPOFOL 10 MG/ML IV BOLUS
INTRAVENOUS | Status: AC
  Filled 2018-09-20: qty 20

## 2018-09-20 MED ORDER — ONDANSETRON HCL 4 MG/2ML IJ SOLN
INTRAMUSCULAR | Status: AC
  Filled 2018-09-20: qty 2

## 2018-09-20 MED ORDER — HEPARIN (PORCINE) 25000 UT/250ML-% IV SOLN
2100.0000 [IU]/h | INTRAVENOUS | Status: DC
  Administered 2018-09-20: 1300 [IU]/h via INTRAVENOUS
  Filled 2018-09-20: qty 250

## 2018-09-20 MED ORDER — LEVETIRACETAM IN NACL 1000 MG/100ML IV SOLN
1000.0000 mg | Freq: Once | INTRAVENOUS | Status: AC
  Administered 2018-09-20: 1000 mg via INTRAVENOUS
  Filled 2018-09-20: qty 100

## 2018-09-20 MED ORDER — HYDROMORPHONE HCL 1 MG/ML IJ SOLN
0.5000 mg | Freq: Once | INTRAMUSCULAR | Status: AC
  Administered 2018-09-20: 0.5 mg via INTRAVENOUS
  Filled 2018-09-20: qty 1

## 2018-09-20 MED ORDER — CEFAZOLIN SODIUM-DEXTROSE 2-4 GM/100ML-% IV SOLN
INTRAVENOUS | Status: AC
  Filled 2018-09-20: qty 100

## 2018-09-20 MED ORDER — CARVEDILOL 3.125 MG PO TABS
3.1250 mg | ORAL_TABLET | Freq: Two times a day (BID) | ORAL | Status: DC
  Administered 2018-09-20 – 2018-09-23 (×7): 3.125 mg via ORAL
  Filled 2018-09-20 (×7): qty 1

## 2018-09-20 MED ORDER — ACETAMINOPHEN 325 MG PO TABS
650.0000 mg | ORAL_TABLET | Freq: Four times a day (QID) | ORAL | Status: DC | PRN
  Administered 2018-09-20 – 2018-09-21 (×2): 650 mg via ORAL
  Filled 2018-09-20 (×2): qty 2

## 2018-09-20 MED ORDER — FENTANYL CITRATE (PF) 250 MCG/5ML IJ SOLN
INTRAMUSCULAR | Status: AC
  Filled 2018-09-20: qty 5

## 2018-09-20 MED ORDER — ONDANSETRON HCL 4 MG PO TABS: 4.0000 mg | ORAL_TABLET | Freq: Four times a day (QID) | ORAL | Status: DC | PRN

## 2018-09-20 MED ORDER — FENTANYL CITRATE (PF) 100 MCG/2ML IJ SOLN
INTRAMUSCULAR | Status: AC
  Filled 2018-09-20: qty 2

## 2018-09-20 SURGICAL SUPPLY — 56 items
ADH SKN CLS APL DERMABOND .7 (GAUZE/BANDAGES/DRESSINGS) ×1
ADH SKN CLS LQ APL DERMABOND (GAUZE/BANDAGES/DRESSINGS) ×1
BAG DECANTER FOR FLEXI CONT (MISCELLANEOUS) ×2 IMPLANT
BIOPATCH BLUE 3/4IN DISK W/1.5 (GAUZE/BANDAGES/DRESSINGS) ×1 IMPLANT
BIOPATCH RED 1 DISK 7.0 (GAUZE/BANDAGES/DRESSINGS) ×1 IMPLANT
CATH PALINDROME RT-P 15FX19CM (CATHETERS) IMPLANT
CATH PALINDROME RT-P 15FX23CM (CATHETERS) ×1 IMPLANT
CATH PALINDROME RT-P 15FX28CM (CATHETERS) IMPLANT
CATH PALINDROME RT-P 15FX55CM (CATHETERS) IMPLANT
CATH STRAIGHT 5FR 65CM (CATHETERS) IMPLANT
COVER PROBE W GEL 5X96 (DRAPES) ×2 IMPLANT
COVER SURGICAL LIGHT HANDLE (MISCELLANEOUS) ×2 IMPLANT
COVER WAND RF STERILE (DRAPES) ×2 IMPLANT
DECANTER SPIKE VIAL GLASS SM (MISCELLANEOUS) ×2 IMPLANT
DERMABOND ADHESIVE PROPEN (GAUZE/BANDAGES/DRESSINGS) ×1
DERMABOND ADVANCED (GAUZE/BANDAGES/DRESSINGS) ×1
DERMABOND ADVANCED .7 DNX12 (GAUZE/BANDAGES/DRESSINGS) ×1 IMPLANT
DERMABOND ADVANCED .7 DNX6 (GAUZE/BANDAGES/DRESSINGS) IMPLANT
DRAPE C-ARM 42X72 X-RAY (DRAPES) ×2 IMPLANT
DRAPE CHEST BREAST 15X10 FENES (DRAPES) ×2 IMPLANT
GAUZE 4X4 16PLY RFD (DISPOSABLE) ×2 IMPLANT
GAUZE SPONGE 4X4 12PLY STRL LF (GAUZE/BANDAGES/DRESSINGS) ×1 IMPLANT
GLOVE BIO SURGEON STRL SZ7.5 (GLOVE) ×2 IMPLANT
GLOVE BIOGEL PI IND STRL 6.5 (GLOVE) IMPLANT
GLOVE BIOGEL PI IND STRL 7.5 (GLOVE) IMPLANT
GLOVE BIOGEL PI IND STRL 8 (GLOVE) ×1 IMPLANT
GLOVE BIOGEL PI INDICATOR 6.5 (GLOVE) ×2
GLOVE BIOGEL PI INDICATOR 7.5 (GLOVE) ×2
GLOVE BIOGEL PI INDICATOR 8 (GLOVE) ×1
GLOVE SURG SS PI 7.5 STRL IVOR (GLOVE) ×1 IMPLANT
GOWN STRL REUS W/ TWL LRG LVL3 (GOWN DISPOSABLE) ×2 IMPLANT
GOWN STRL REUS W/ TWL XL LVL3 (GOWN DISPOSABLE) ×2 IMPLANT
GOWN STRL REUS W/TWL LRG LVL3 (GOWN DISPOSABLE) ×4
GOWN STRL REUS W/TWL XL LVL3 (GOWN DISPOSABLE) ×6
KIT BASIN OR (CUSTOM PROCEDURE TRAY) ×2 IMPLANT
KIT TURNOVER KIT B (KITS) ×2 IMPLANT
NDL 18GX1X1/2 (RX/OR ONLY) (NEEDLE) ×1 IMPLANT
NDL HYPO 25GX1X1/2 BEV (NEEDLE) ×1 IMPLANT
NEEDLE 18GX1X1/2 (RX/OR ONLY) (NEEDLE) ×2 IMPLANT
NEEDLE HYPO 25GX1X1/2 BEV (NEEDLE) ×2 IMPLANT
NS IRRIG 1000ML POUR BTL (IV SOLUTION) ×2 IMPLANT
PACK SURGICAL SETUP 50X90 (CUSTOM PROCEDURE TRAY) ×2 IMPLANT
PAD ARMBOARD 7.5X6 YLW CONV (MISCELLANEOUS) ×4 IMPLANT
SET MICROPUNCTURE 5F STIFF (MISCELLANEOUS) IMPLANT
SOAP 2 % CHG 4 OZ (WOUND CARE) ×2 IMPLANT
SUT ETHILON 3 0 PS 1 (SUTURE) ×3 IMPLANT
SUT MNCRL AB 4-0 PS2 18 (SUTURE) ×2 IMPLANT
SYR 10ML LL (SYRINGE) ×2 IMPLANT
SYR 20CC LL (SYRINGE) ×4 IMPLANT
SYR 5ML LL (SYRINGE) ×2 IMPLANT
SYR CONTROL 10ML LL (SYRINGE) ×2 IMPLANT
TAPE CLOTH SURG 4X10 WHT LF (GAUZE/BANDAGES/DRESSINGS) ×1 IMPLANT
TOWEL GREEN STERILE (TOWEL DISPOSABLE) ×2 IMPLANT
TOWEL GREEN STERILE FF (TOWEL DISPOSABLE) ×2 IMPLANT
WATER STERILE IRR 1000ML POUR (IV SOLUTION) ×2 IMPLANT
WIRE AMPLATZ SS-J .035X180CM (WIRE) IMPLANT

## 2018-09-20 NOTE — Anesthesia Procedure Notes (Signed)
Procedure Name: LMA Insertion Date/Time: 09/20/2018 11:00 AM Performed by: Cleda Daub, CRNA Pre-anesthesia Checklist: Patient identified, Emergency Drugs available, Suction available and Patient being monitored Patient Re-evaluated:Patient Re-evaluated prior to induction Oxygen Delivery Method: Circle system utilized Preoxygenation: Pre-oxygenation with 100% oxygen Induction Type: IV induction LMA: LMA inserted LMA Size: 5.0 Number of attempts: 1 Placement Confirmation: ETT inserted through vocal cords under direct vision,  positive ETCO2 and breath sounds checked- equal and bilateral Tube secured with: Tape Dental Injury: Teeth and Oropharynx as per pre-operative assessment

## 2018-09-20 NOTE — Anesthesia Postprocedure Evaluation (Signed)
Anesthesia Post Note  Patient: Leanord Hawking  Procedure(s) Performed: INSERTION OF TUNNELED DIALYSIS CATHETER RIGHT INTERNAL JUGULAR (N/A )     Patient location during evaluation: PACU Anesthesia Type: General Level of consciousness: awake and alert Pain management: pain level controlled Vital Signs Assessment: post-procedure vital signs reviewed and stable Respiratory status: spontaneous breathing, nonlabored ventilation and respiratory function stable Cardiovascular status: blood pressure returned to baseline and stable Postop Assessment: no apparent nausea or vomiting Anesthetic complications: no    Last Vitals:  Vitals:   09/20/18 1150 09/20/18 1206  BP: (!) 162/97 (!) 148/104  Pulse: 79 69  Resp: 13 14  Temp: (!) 36.3 C   SpO2: 100% 96%                  Audry Pili

## 2018-09-20 NOTE — Anesthesia Preprocedure Evaluation (Addendum)
Anesthesia Evaluation  Patient identified by MRN, date of birth, ID band Patient awake    Reviewed: Allergy & Precautions, NPO status , Patient's Chart, lab work & pertinent test results  History of Anesthesia Complications Negative for: history of anesthetic complications  Airway Mallampati: IV  TM Distance: >3 FB Neck ROM: Full   Comment: Poor effort when checking mallampati Dental  (+) Dental Advisory Given, Chipped,    Pulmonary former smoker,    breath sounds clear to auscultation       Cardiovascular hypertension, Pt. on home beta blockers and Pt. on medications  Rhythm:Regular Rate:Normal     Neuro/Psych Seizures -, Well Controlled,  CVA, No Residual Symptoms negative psych ROS   GI/Hepatic negative GI ROS, Neg liver ROS,   Endo/Other   Obesity   Renal/GU ESRFRenal disease     Musculoskeletal negative musculoskeletal ROS (+)   Abdominal   Peds  Hematology  (+) anemia ,  Thrombocytopenia    Anesthesia Other Findings Lupus  Reproductive/Obstetrics                            Anesthesia Physical Anesthesia Plan  ASA: IV  Anesthesia Plan: General   Post-op Pain Management:    Induction: Intravenous  PONV Risk Score and Plan: 2 and Treatment may vary due to age or medical condition, Ondansetron and Midazolam  Airway Management Planned: LMA  Additional Equipment: None  Intra-op Plan:   Post-operative Plan: Extubation in OR  Informed Consent: I have reviewed the patients History and Physical, chart, labs and discussed the procedure including the risks, benefits and alternatives for the proposed anesthesia with the patient or authorized representative who has indicated his/her understanding and acceptance.     Dental advisory given  Plan Discussed with: CRNA and Anesthesiologist  Anesthesia Plan Comments:        Anesthesia Quick Evaluation

## 2018-09-20 NOTE — Op Note (Signed)
OPERATIVE NOTE  PROCEDURE: 1. Right internal jugular vein cannulation under ultrasound guidance 2. Right internal jugular vein tunneled dialysis catheter placement (23 cm Palindrome catheter)  PRE-OPERATIVE DIAGNOSIS: Acute on chronic CKD  POST-OPERATIVE DIAGNOSIS: same as above  SURGEON: Marty Heck, MD  ANESTHESIA: LMA  ESTIMATED BLOOD LOSS: 30 cc  FINDING(S): 1.  Tips of the catheter in the right atrium on fluoroscopy 2.  No obvious pneumothorax on fluoroscopy  SPECIMEN(S):  none  INDICATIONS:   [name] [name] is a 38 y.o. male who presents with acute kidney injury on CKD.   He previously had a left first stage brachiobasilic fistula placed, but nephrology feels he needs hemodialysis this weekend.  The patient presents for tunneled dialysis catheter placement.  The patient is aware the risks of tunneled dialysis catheter placement include but are not limited to: bleeding, infection, central venous injury, pneumothorax, possible venous stenosis, possible malpositioning in the venous system, and possible infections related to long-term catheter presence.  The patient was aware of these risks and agreed to proceed.  DESCRIPTION: After written full informed consent was obtained from the patient, the patient was taken back to the operating room.  Prior to induction, the patient was given IV antibiotics.  After obtaining adequate sedation, the patient was prepped and draped in the standard fashion for a right neck tunneled dialysis catheter placement.  Under ultrasound guidance, the right internal jugular vein was evaluated, it was patent, an image was saved.  Under ultrasound guidance, it was cannulated with the 18 gauge needle.  A J-wire was then placed down into the right ventricle under fluoroscopic guidance.  The wire was then secured in place with a clamp to the drapes.  I then made stab incisions at the neck and exit sites after measuring for a 23 cm catheter.   I  dissected from the chest wall exit site to the cannulation site with a tunneler.  The back end of this catheter was transected, and docked onto the tunneler.  The distal catheter was delivered through the subcutaneous tunnel.  The catheter was transected a second time, revealing the two lumens of this catheter.  The wire was then unclamped and I removed the needle.  The skin tract and venotomy was dilated serially with dilators.  Finally, the dilator-sheath was placed under fluoroscopic guidance into the superior vena cava.  The dilator and wire were removed.  A 23 cm Diatek catheter was placed under fluoroscopic guidance down into the right atrium.  The sheath was broken and peeled away while holding the catheter cuff at the level of the skin.  The ports were docked onto these two lumens.  The catheter collar was then snapped into place.  Each port was tested by aspirating and flushing.  No resistance was noted.  Each port was then thoroughly flushed with heparinized saline.  The catheter was secured in placed with two interrupted stitches of 3-0 Nylon tied to the catheter.  The neck incision was closed with a U-stitch of 4-0 Monocryl.  The neck and chest incision were cleaned and sterile bandages applied including Dermabond.  Each port was then loaded with concentrated heparin (1000 Units/mL) at the manufacturer recommended volumes to each port.  Sterile caps were applied to each port.    On completion fluoroscopy, the tips of the catheter were in the right atrium, and there was no evidence of pneumothorax.  COMPLICATIONS: None  CONDITION: Stable  Marty Heck, MD [facility] [place] Office: [phone] Pager: [phone]   09/20/2018, 11:40 AM

## 2018-09-20 NOTE — Progress Notes (Signed)
ANTICOAGULATION CONSULT NOTE - Initial Consult  Pharmacy Consult for IV heparin Indication: hx CVA/lupus AC  Allergies  Allergen Reactions  . Zyrtec [Cetirizine] Swelling    Facial swelling    Patient Measurements: Height: 6\' 1"  (185.4 cm) Weight: 250 lb (113.4 kg) IBW/kg (Calculated) : 79.9 Heparin Dosing Weight: 90 kg  Vital Signs: Temp: 98.7 F (37.1 C) (04/04 0401) Temp Source: Oral (04/04 0401) BP: 162/106 (04/04 0401) Pulse Rate: 95 (04/04 0401)  Labs: Recent Labs    09/20/18 0214  HGB 8.6*  HCT 26.4*  PLT 133*  CREATININE 22.28*    Estimated Creatinine Clearance: 5.9 mL/min (A) (by C-G formula based on SCr of 22.28 mg/dL (H)).   Medical History: Past Medical History:  Diagnosis Date  . Chronic kidney disease   . Hypertension   . Lupus ([place])   . Seizures ([place])   . Seizures ([place])   . Stroke ([place])   . Stroke [facility])    38 or 38 years of age.      Medications:  Scheduled:  . amLODipine  10 mg Oral Daily  . atorvastatin  40 mg Oral QPM  . calcitRIOL  0.25 mcg Oral Daily  . calcium acetate  667 mg Oral See admin instructions  . carvedilol  3.125 mg Oral BID WC  . lamoTRIgine  150 mg Oral BID   Infusions:    Assessment: 38 yoM Goal of Therapy:  Heparin level 0.3-0.7 units/ml aPTT 66-102 seconds Monitor platelets by anticoagulation protocol: Yes   Plan:  Baseline aptt/HL/PT/INR STAT Start at 1300 units/hr (no bolus) F/u baselines to decide whether to follow using aptt vs HL Daily CBC  Lawana Pai R 09/20/2018,5:07 AM

## 2018-09-20 NOTE — Transfer of Care (Signed)
Immediate Anesthesia Transfer of Care Note  Patient: [name] [name]  Procedure(s) Performed: INSERTION OF TUNNELED DIALYSIS CATHETER RIGHT INTERNAL JUGULAR (N/A )  Patient Location: PACU  Anesthesia Type:General  Level of Consciousness: awake, alert , oriented and patient cooperative  Airway & Oxygen Therapy: Patient Spontanous Breathing and Patient connected to face mask oxygen  Post-op Assessment: Report given to RN and Post -op Vital signs reviewed and stable  Post vital signs: Reviewed and stable  Last Vitals:  Vitals Value Taken Time  BP 162/97 09/20/2018 11:49 AM  Temp    Pulse 73 09/20/2018 11:55 AM  Resp 13 09/20/2018 11:55 AM  SpO2 96 % 09/20/2018 11:55 AM  Vitals shown include unvalidated device data.  Last Pain:  Vitals:   09/20/18 0837  TempSrc: Oral  PainSc:          Complications: No apparent anesthesia complications

## 2018-09-20 NOTE — Progress Notes (Signed)
Patient Off the floor for surgery

## 2018-09-20 NOTE — Procedures (Signed)
   I was present at this dialysis session, have reviewed the session itself and made  appropriate changes Kelly Splinter MD [place] pager [phone]   09/20/2018, 4:04 PM

## 2018-09-20 NOTE — Consult Note (Signed)
Renal Service Consult Note [name] [name] 09/20/2018 [name] [name] Requesting Physician:  [name] [name] [name]  Reason for Consult:  CKD V, seizure HPI: The patient is a 38 y.o. year-old w/ hx of CVA x 2, seizures, SLE, APLA  on eliquis, HTN and CKD stage V f/b [name] [name] at [facility] brought to ED by EMS after having 5 min seizure at the [place] store at 1 am this morning. Pt admitted and stable, asked to see for ESRD.    At last [facility] visit in Oct 2019 creat was 9.6, since then creat ~11. Pt had AVF created recently Aug 15, 2018 by [name] [name] in [place] (1st stage BBF). This am had a seizure, shows me his tongue w/ small lacerations, not bleeding. Pt is tired and drowsy, denies any active CP, recent N/V.  +hx of prior seizures, is on Lamictal but hasn't taken for 2 weeks.   ROS  denies CP  no joint pain   no HA  no blurry vision  no rash  no diarrhea  no nausea/ vomiting   Past Medical History  Past Medical History:  Diagnosis Date  . Chronic kidney disease   . Hypertension   . Lupus ([place])   . Seizures ([place])   . Seizures ([place])   . Stroke ([place])   . Stroke [facility])    38 or 38 years of age.     Past Surgical History  Past Surgical History:  Procedure Laterality Date  . AV FISTULA PLACEMENT Left 12/30/2017   Procedure: ARTERIOVENOUS (AV) FISTULA CREATION VERSUS INSERTION OF ARTERIOVENOUS GRAFT LEFT ARM;  Surgeon: [name] Posner, [name];  Location: [place];  Service: Vascular;  Laterality: Left;  . BASCILIC VEIN TRANSPOSITION Left 08/15/2018   Procedure: LEFT FIRST STAGE BASCILIC VEIN TRANSPOSITION;  Surgeon: [name] Posner, [name];  Location: [place];  Service: Vascular;  Laterality: Left;  . RENAL BIOPSY     Family History  Family History  Problem Relation Age of Onset  . Diabetes Mother   . Diabetes Maternal Grandmother    Social History  reports that he quit smoking about 8 months ago. His smoking use included cigarettes. He smoked 0.25 packs per day. He has never used  smokeless tobacco. He reports previous alcohol use. He reports that he does not use drugs. Allergies  Allergies  Allergen Reactions  . Zyrtec [Cetirizine] Swelling    Facial swelling   Home medications Prior to Admission medications   Medication Sig Start Date End Date Taking? Authorizing Provider  amLODipine (NORVASC) 10 MG tablet Take 1 tablet (10 mg total) by mouth daily. 03/17/18  Yes [name] [name]  apixaban (ELIQUIS) 2.5 MG TABS tablet Take 1 tablet (2.5 mg total) by mouth 2 (two) times daily. 03/18/18 09/20/18 Yes [name] [name]  atorvastatin (LIPITOR) 40 MG tablet Take 1 tablet (40 mg total) by mouth daily. Patient taking differently: Take 40 mg by mouth every evening.  01/10/18  Yes [name] [name]  calcitRIOL (ROCALTROL) 0.25 MCG capsule Take 1 capsule (0.25 mcg total) by mouth daily. 12/27/17  Yes [name] [name]  calcium acetate (PHOSLO) 667 MG capsule Take 2 capsules (1,334 mg total) by mouth 3 (three) times daily with meals. Take extra 1 capsule with snacks Patient taking differently: Take 667 mg by mouth See admin instructions. Take 667 mg with each meal and each snack 12/26/17  Yes [name] [name]  carvedilol (COREG) 3.125 MG tablet Take 1  tablet (3.125 mg total) by mouth 2 (two) times daily with a meal. 12/26/17  Yes [name] [name]  LamoTRIgine (LAMICTAL XR) 300 MG TB24 24 hour tablet Take 1 tablet (300 mg total) by mouth daily. 01/10/18  Yes [name] [name]  hydrochlorothiazide (HYDRODIURIL) 25 MG tablet Take 1 tablet (25 mg total) by mouth daily. Patient not taking: Reported on 09/20/2018 03/17/18 08/14/18  [name] [name]   Liver Function Tests Recent Labs  Lab 09/20/18 0214  AST 15  ALT 15  ALKPHOS 91  BILITOT 0.5  PROT 7.7  ALBUMIN 3.3*   No results for input(s): LIPASE, AMYLASE in the last 168 hours. CBC Recent Labs  Lab 09/20/18 0214  WBC 11.7*  HGB 8.6*  HCT 26.4*  MCV 92.6  PLT 595*   Basic Metabolic Panel Recent Labs  Lab  09/20/18 0214  NA 138  K 4.0  CL 108  CO2 11*  GLUCOSE 131*  BUN 116*  CREATININE 22.28*  CALCIUM 8.4*   Iron/TIBC/Ferritin/ %Sat    Component Value Date/Time   IRON 84 08/29/2018 1228   TIBC 230 (L) 08/29/2018 1228   FERRITIN 303 08/29/2018 1228   IRONPCTSAT 37 08/29/2018 1228    Vitals:   09/20/18 0500 09/20/18 0545 09/20/18 0643 09/20/18 0837  BP: (!) 159/87 (!) 155/99 (!) 165/89 (!) 137/103  Pulse: 88 82 77 78  Resp: 18 17 19 19   Temp:   98.3 F (36.8 C) 98.3 F (36.8 C)  TempSrc:   Oral Oral  SpO2: (!) 86% 91% 98% 99%  Weight:   121.7 kg   Height:       Exam Gen alert, no distress No rash, cyanosis or gangrene Sclera anicteric, throat clear , tongue lac's diffusely on the sides No jvd or bruits Chest clear bilat no rales or wheezing RRR no MRG Abd soft ntnd no mass or ascites +bs GU normal male MS no joint effusions or deformity Ext no LE or UE edema, no wounds or ulcers Neuro is alert, Ox 3 , nf LUA AVF+bruit    Home meds:  - amlodipine 10/ carvedilol 3.125 bid/ hydrochlorothiazide 25 qd  - lamotrigine xr 300/ day  - calc acetate ac tid  - atorvastatin 40  - apixaban 2.5 bid    Assessment: 1. CKD stage V - with seizures and creat 22, will need to start on dialysis.  Have d/w VVS , they will place [facility] today , appreciate their assistance.  HD today and tomorrow and Monday, gradual ^ in time/ bfr/ dfr.  2. HTN - cont meds, get vol down if will tolerate 3. SLE  4. Antiphospholipid antibody syndrome - on blood thinners since 2003, dx'd at time of 1st CVA in 2003; noncompliant w/ meds and had 2nd CVA in 2005 5. Seizure d/o - on lamictal, ran out 2 wks ago 6. Anemia ckd -  Hb 8.6, follow 7. Volume - no gross edema on exam 8. SP LUA AVF = on 08/15/18    Plan: 1. As above      [facility] 09/20/2018, 9:16 AM

## 2018-09-20 NOTE — ED Notes (Signed)
LEFT ARM RESTRICTION ( FISTULA )

## 2018-09-20 NOTE — Consult Note (Signed)
Hospital Consult    Reason for Consult:  [facility] Referring Physician:  Nephrology MRN #:  [id number]  History of Present Illness: This is a 38 y.o. male with history of SLE, previous CVA, lupus anticoagulant on chronic Eliquis, CKD status post left first stage brachiobasilic fistula placement earlier this year that vascular surgery has been consulted for [facility] placement.  Patient was admitted from the ED last night after a seizure.  Ultimately he was admitted with concern for imminent need for dialysis with acute on chronic renal failure.  Patient denies any previous tunneled catheters.  He denies any ICD or other chest wall implants.  States has not taken his Eliquis in several days because he has been busy working.  He is on a heparin drip at this time.  Has been n.p.o. since yesterday.  Past Medical History:  Diagnosis Date  . Chronic kidney disease   . Hypertension   . Lupus ([place])   . Seizures ([place])   . Seizures ([place])   . Stroke ([place])   . Stroke [facility])    55 or 38 years of age.      Past Surgical History:  Procedure Laterality Date  . AV FISTULA PLACEMENT Left 12/30/2017   Procedure: ARTERIOVENOUS (AV) FISTULA CREATION VERSUS INSERTION OF ARTERIOVENOUS GRAFT LEFT ARM;  Surgeon: Rosetta Posner, MD;  Location: [place];  Service: Vascular;  Laterality: Left;  . BASCILIC VEIN TRANSPOSITION Left 08/15/2018   Procedure: LEFT FIRST STAGE BASCILIC VEIN TRANSPOSITION;  Surgeon: Rosetta Posner, MD;  Location: [place];  Service: Vascular;  Laterality: Left;  . RENAL BIOPSY      Allergies  Allergen Reactions  . Zyrtec [Cetirizine] Swelling    Facial swelling    Prior to Admission medications   Medication Sig Start Date End Date Taking? Authorizing Provider  amLODipine (NORVASC) 10 MG tablet Take 1 tablet (10 mg total) by mouth daily. 03/17/18  Yes Gildardo Pounds, NP  apixaban (ELIQUIS) 2.5 MG TABS tablet Take 1 tablet (2.5 mg total) by mouth 2 (two) times daily. 03/18/18 09/20/18 Yes Gildardo Pounds, NP  atorvastatin (LIPITOR) 40 MG tablet Take 1 tablet (40 mg total) by mouth daily. Patient taking differently: Take 40 mg by mouth every evening.  01/10/18  Yes Gildardo Pounds, NP  calcitRIOL (ROCALTROL) 0.25 MCG capsule Take 1 capsule (0.25 mcg total) by mouth daily. 12/27/17  Yes Oswald Hillock, MD  calcium acetate (PHOSLO) 667 MG capsule Take 2 capsules (1,334 mg total) by mouth 3 (three) times daily with meals. Take extra 1 capsule with snacks Patient taking differently: Take 667 mg by mouth See admin instructions. Take 667 mg with each meal and each snack 12/26/17  Yes Darrick Meigs, Marge Duncans, MD  carvedilol (COREG) 3.125 MG tablet Take 1 tablet (3.125 mg total) by mouth 2 (two) times daily with a meal. 12/26/17  Yes Darrick Meigs, Marge Duncans, MD  LamoTRIgine (LAMICTAL XR) 300 MG TB24 24 hour tablet Take 1 tablet (300 mg total) by mouth daily. 01/10/18  Yes Gildardo Pounds, NP  hydrochlorothiazide (HYDRODIURIL) 25 MG tablet Take 1 tablet (25 mg total) by mouth daily. Patient not taking: Reported on 09/20/2018 03/17/18 08/14/18  Gildardo Pounds, NP    Social History   Socioeconomic History  . Marital status: Single    Spouse name: Not on file  . Number of children: Not on file  . Years of education: Not on file  . Highest education level: Not on file  Occupational  History  . Not on file  Social Needs  . Financial resource strain: Not on file  . Food insecurity:    Worry: Not on file    Inability: Not on file  . Transportation needs:    Medical: Not on file    Non-medical: Not on file  Tobacco Use  . Smoking status: Former Smoker    Packs/day: 0.25    Types: Cigarettes    Last attempt to quit: 12/21/2017    Years since quitting: 0.7  . Smokeless tobacco: Never Used  Substance and Sexual Activity  . Alcohol use: Not Currently  . Drug use: No  . Sexual activity: Not Currently  Lifestyle  . Physical activity:    Days per week: Not on file    Minutes per session: Not on file  . Stress: Not on file   Relationships  . Social connections:    Talks on phone: Not on file    Gets together: Not on file    Attends religious service: Not on file    Active member of club or organization: Not on file    Attends meetings of clubs or organizations: Not on file    Relationship status: Not on file  . Intimate partner violence:    Fear of current or ex partner: Not on file    Emotionally abused: Not on file    Physically abused: Not on file    Forced sexual activity: Not on file  Other Topics Concern  . Not on file  Social History Narrative  . Not on file     Family History  Problem Relation Age of Onset  . Diabetes Mother   . Diabetes Maternal Grandmother     ROS: [x]  Positive   [ ]  Negative   [ ]  All sytems reviewed and are negative  Cardiovascular: []  chest pain/pressure []  palpitations []  SOB lying flat []  DOE []  pain in legs while walking []  pain in legs at rest []  pain in legs at night []  non-healing ulcers []  hx of DVT []  swelling in legs  Pulmonary: []  productive cough []  asthma/wheezing []  home O2  Neurologic: []  weakness in []  arms []  legs []  numbness in []  arms []  legs []  hx of CVA []  mini stroke [] difficulty speaking or slurred speech []  temporary loss of vision in one eye []  dizziness  Hematologic: []  hx of cancer []  bleeding problems []  problems with blood clotting easily  Endocrine:   []  diabetes []  thyroid disease  GI []  vomiting blood []  blood in stool  GU: []  CKD/renal failure []  HD--[]  M/W/F or []  T/T/S []  burning with urination []  blood in urine  Psychiatric: []  anxiety []  depression  Musculoskeletal: []  arthritis []  joint pain  Integumentary: []  rashes []  ulcers  Constitutional: []  fever []  chills   Physical Examination  Vitals:   09/20/18 0643 09/20/18 0837  BP: (!) 165/89 (!) 137/103  Pulse: 77 78  Resp: 19 19  Temp: 98.3 F (36.8 C) 98.3 F (36.8 C)  SpO2: 98% 99%   Body mass index is 35.4 kg/m.   General:  WDWN in NAD Gait: Not observed HENT: WNL, normocephalic Pulmonary: normal non-labored breathing, without Rales, rhonchi,  wheezing Cardiac: regular, without  Murmurs, rubs or gallops Abdomen: soft, NT/ND, no masses Vascular Exam/Pulses: 2+ radial pulse palpable bilateral upper extremities Left brachiobasilic fistula with well healed incision and palpable thrill Extremities: without ischemic changes, without Gangrene , without cellulitis; without open wounds;  Musculoskeletal: no muscle wasting  or atrophy  Neurologic: A&O X 3; Appropriate Affect ; SENSATION: normal; MOTOR FUNCTION:  moving all extremities equally. Speech is fluent/normal   CBC    Component Value Date/Time   WBC 11.7 (H) 09/20/2018 0214   RBC 2.85 (L) 09/20/2018 0214   HGB 8.6 (L) 09/20/2018 0214   HGB 11.6 (L) 12/12/2017 1609   HGB 15.5 10/14/2013 1434   HCT 26.4 (L) 09/20/2018 0214   HCT 34.7 (L) 12/12/2017 1609   HCT 45.6 10/14/2013 1434   PLT 133 (L) 09/20/2018 0214   PLT 134 (L) 12/12/2017 1609   MCV 92.6 09/20/2018 0214   MCV 88 12/12/2017 1609   MCV 87.2 10/14/2013 1434   MCH 30.2 09/20/2018 0214   MCHC 32.6 09/20/2018 0214   RDW 14.6 09/20/2018 0214   RDW 13.2 12/12/2017 1609   RDW 13.0 10/14/2013 1434   LYMPHSABS 1.4 12/12/2017 1609   LYMPHSABS 1.4 10/14/2013 1434   MONOABS 0.7 10/20/2016 0202   MONOABS 0.5 10/14/2013 1434   EOSABS 0.2 12/12/2017 1609   BASOSABS 0.0 12/12/2017 1609   BASOSABS 0.0 10/14/2013 1434    BMET    Component Value Date/Time   NA 138 09/20/2018 0214   NA 143 12/12/2017 1609   K 4.0 09/20/2018 0214   CL 108 09/20/2018 0214   CO2 11 (L) 09/20/2018 0214   GLUCOSE 131 (H) 09/20/2018 0214   BUN 116 (H) 09/20/2018 0214   BUN 36 (H) 12/12/2017 1609   CREATININE 22.28 (H) 09/20/2018 0214   CREATININE 2.06 (H) 10/24/2015 1603   CALCIUM 8.4 (L) 09/20/2018 0214   CALCIUM 8.4 (L) 12/23/2017 0237   GFRNONAA 2 (L) 09/20/2018 0214   GFRNONAA 40 (L) 10/24/2015 1603    GFRAA 3 (L) 09/20/2018 0214   GFRAA 47 (L) 10/24/2015 1603    COAGS: Lab Results  Component Value Date   INR 1.06 12/30/2017   INR 1.03 12/24/2017   INR 1.14 12/22/2017   PROTIME 28.8 (H) 11/04/2013   PROTIME 21.6 (H) 10/14/2013   PROTIME 18.0 (H) 09/23/2013     Non-Invasive Vascular Imaging:    None  ASSESSMENT/PLAN: This is a 38 y.o. male with history of SLE, lupus anticoagulant on Eliquis, seizures, acute on chronic renal failure that vascular surgery has been asked to place tunneled dialysis catheter for immediate dialysis needs.  Given that he is n.p.o. we will plan to take him to the operating room today for a tunneled dialysis catheter placement.  I discussed risk and benefits with the patient.  I suspect he would have a high risk of bleeding complication on Eliquis with tunneling in chest wall, but he states he has not taken this in several days.  His heparin gtt can be held on-call to the OR.  Marty Heck, MD [facility] Office: [phone] Pager: [phone]

## 2018-09-20 NOTE — ED Triage Notes (Signed)
Pt comes to ed via ems, c/o seizure witnessed for 5 min by friend, happened at sheet on [address] at 1 am. Pt has small lac on lip/ bite his tongue. V/s pluses 140 , cbg 145, temp 101.5, rr18. Medical hx of seizures.

## 2018-09-20 NOTE — Progress Notes (Signed)
ANTICOAGULATION CONSULT NOTE - Initial Consult  Pharmacy Consult for heparin Indication: hxo CVA/lupus AC on apixaban  Allergies  Allergen Reactions  . Zyrtec [Cetirizine] Swelling    Facial swelling    Patient Measurements: Height: 6\' 1"  (185.4 cm) Weight: 268 lb 4.8 oz (121.7 kg) IBW/kg (Calculated) : 79.9 Heparin Dosing Weight: 103.9 kg  Vital Signs: Temp: (P) 97.7 F (36.5 C) (04/04 1452) Temp Source: (P) Oral (04/04 1452) BP: (P) 164/107 (04/04 1452) Pulse Rate: (P) 63 (04/04 1452)  Labs: Recent Labs    09/20/18 0214  HGB 8.6*  HCT 26.4*  PLT 133*  CREATININE 22.28*    Estimated Creatinine Clearance: 6.1 mL/min (A) (by C-G formula based on SCr of 22.28 mg/dL (H)).   Medical History: Past Medical History:  Diagnosis Date  . Chronic kidney disease   . Hypertension   . Lupus ([place])   . Seizures ([place])   . Seizures ([place])   . Stroke ([place])   . Stroke [facility])    34 or 38 years of age.      Assessment: 59 yoM on apixaban 2.5 mg PO BID PTA for hxo CVA/lupus anticoagulant. Unsure of last dose of apixaban, per patient it was within the last week, so likely apixaban has cleared, noted history of medication non-compliance.  Patient s/p placement of TDC on 4/4 AM. Heparin not charted as being stopped during procedure, and patient received a total of 9,400 units heparin IJ during the procedure.   Baseline CBC low with Hgb 8.6/HCT 26.4 and pltc 133. Heparin level subtherapeutic this afternoon  <0.10 and aPTT 57, on 1300 units/hr. No s/sx of bleeding noted. Will utilize heparin levels to titrate infusion.       Goal of Therapy:  Heparin level 0.3-0.7 units/ml aPTT 66-102 seconds Monitor platelets by anticoagulation protocol: Yes   Plan:  Increase heparin infusion to 1500 units/hr Check anti-Xa level in 8 hours and daily while on heparin Continue to monitor H&H and platelets  F/u plans to transition back to apixaban  Thank you for allowing pharmacy to be a part of  this patient's care.  Leron Croak, PharmD PGY1 Pharmacy Resident  Please check AMION for all [facility] Pharmacy phone numbers 09/20/2018,3:37 PM

## 2018-09-20 NOTE — H&P (Signed)
History and Physical    NAHSIR [name] [name]:[id number] DOB: [birth year] DOA: 09/20/2018  PCP: Gildardo Pounds, NP  Patient coming from: Home  I have personally briefly reviewed patient's old medical records in [place]  Chief Complaint: Seizure  HPI: [name] [name] is a 38 y.o. male with medical history significant of SLE, stroke, lupus anticoagulant on chronic eliquis, advanced CKD, presumably stage 5, just had initial dialysis fistula surgery in Feb.  Patient has been missing his Lamictal for past 1-2 weeks.  Today had a seizure, and brought in to ED.   ED Course: Patient awake post seizure.  Given 1 time dose of keppra and resumed on lamictal.  Initially wanted to go home.  But lab work came back with creat 22, BUN 116, and bicarb of 11.  EDP concerned that it may be time to start dialysis and requests admission.   Review of Systems: As per HPI otherwise 10 point review of systems negative.   Past Medical History:  Diagnosis Date  . Chronic kidney disease   . Hypertension   . Lupus ([place])   . Seizures ([place])   . Seizures ([place])   . Stroke ([place])   . Stroke [facility])    35 or 38 years of age.      Past Surgical History:  Procedure Laterality Date  . AV FISTULA PLACEMENT Left 12/30/2017   Procedure: ARTERIOVENOUS (AV) FISTULA CREATION VERSUS INSERTION OF ARTERIOVENOUS GRAFT LEFT ARM;  Surgeon: Rosetta Posner, MD;  Location: [facility];  Service: Vascular;  Laterality: Left;  . BASCILIC VEIN TRANSPOSITION Left 08/15/2018   Procedure: LEFT FIRST STAGE BASCILIC VEIN TRANSPOSITION;  Surgeon: Rosetta Posner, MD;  Location: [place];  Service: Vascular;  Laterality: Left;  . RENAL BIOPSY       reports that he quit smoking about 8 months ago. His smoking use included cigarettes. He smoked 0.25 packs per day. He has never used smokeless tobacco. He reports previous alcohol use. He reports that he does not use drugs.  Allergies  Allergen Reactions  . Zyrtec [Cetirizine] Swelling   Facial swelling    Family History  Problem Relation Age of Onset  . Diabetes Mother   . Diabetes Maternal Grandmother      Prior to Admission medications   Medication Sig Start Date End Date Taking? Authorizing Provider  amLODipine (NORVASC) 10 MG tablet Take 1 tablet (10 mg total) by mouth daily. 03/17/18  Yes Gildardo Pounds, NP  apixaban (ELIQUIS) 2.5 MG TABS tablet Take 1 tablet (2.5 mg total) by mouth 2 (two) times daily. 03/18/18 09/20/18 Yes Gildardo Pounds, NP  atorvastatin (LIPITOR) 40 MG tablet Take 1 tablet (40 mg total) by mouth daily. Patient taking differently: Take 40 mg by mouth every evening.  01/10/18  Yes Gildardo Pounds, NP  calcitRIOL (ROCALTROL) 0.25 MCG capsule Take 1 capsule (0.25 mcg total) by mouth daily. 12/27/17  Yes Oswald Hillock, MD  calcium acetate (PHOSLO) 667 MG capsule Take 2 capsules (1,334 mg total) by mouth 3 (three) times daily with meals. Take extra 1 capsule with snacks Patient taking differently: Take 667 mg by mouth See admin instructions. Take 667 mg with each meal and each snack 12/26/17  Yes Darrick Meigs, Marge Duncans, MD  carvedilol (COREG) 3.125 MG tablet Take 1 tablet (3.125 mg total) by mouth 2 (two) times daily with a meal. 12/26/17  Yes Oswald Hillock, MD  LamoTRIgine (LAMICTAL XR) 300 MG TB24 24 hour tablet Take  1 tablet (300 mg total) by mouth daily. 01/10/18  Yes Gildardo Pounds, NP  hydrochlorothiazide (HYDRODIURIL) 25 MG tablet Take 1 tablet (25 mg total) by mouth daily. Patient not taking: Reported on 09/20/2018 03/17/18 08/14/18  Gildardo Pounds, NP    Physical Exam: Vitals:   09/20/18 0218 09/20/18 0226 09/20/18 0317 09/20/18 0401  BP: (!) 148/77  (!) 152/101 (!) 162/106  Pulse:   95 95  Resp:   18 20  Temp:  98.2 F (36.8 C) 98.9 F (37.2 C) 98.7 F (37.1 C)  TempSrc:  Rectal Oral Oral  SpO2:   95% 95%  Weight:      Height:        Constitutional: NAD, calm, comfortable Eyes: PERRL, lids and conjunctivae normal ENMT: Mucous membranes are  moist. Posterior pharynx clear of any exudate or lesions.Normal dentition.  Neck: normal, supple, no masses, no thyromegaly Respiratory: clear to auscultation bilaterally, no wheezing, no crackles. Normal respiratory effort. No accessory muscle use.  Cardiovascular: Regular rate and rhythm, no murmurs / rubs / gallops. No extremity edema. 2+ pedal pulses. No carotid bruits.  Abdomen: no tenderness, no masses palpated. No hepatosplenomegaly. Bowel sounds positive.  Musculoskeletal: no clubbing / cyanosis. No joint deformity upper and lower extremities. Good ROM, no contractures. Normal muscle tone.  Skin: no rashes, lesions, ulcers. No induration Neurologic: CN 2-12 grossly intact. Sensation intact, DTR normal. Strength 5/5 in all 4.  Psychiatric: Normal judgment and insight. Alert and oriented x 3. Normal mood.    Labs on Admission: I have personally reviewed following labs and imaging studies  CBC: Recent Labs  Lab 09/20/18 0214  WBC 11.7*  HGB 8.6*  HCT 26.4*  MCV 92.6  PLT 818*   Basic Metabolic Panel: Recent Labs  Lab 09/20/18 0214  NA 138  K 4.0  CL 108  CO2 11*  GLUCOSE 131*  BUN 116*  CREATININE 22.28*  CALCIUM 8.4*   GFR: Estimated Creatinine Clearance: 5.9 mL/min (A) (by C-G formula based on SCr of 22.28 mg/dL (H)). Liver Function Tests: Recent Labs  Lab 09/20/18 0214  AST 15  ALT 15  ALKPHOS 91  BILITOT 0.5  PROT 7.7  ALBUMIN 3.3*   No results for input(s): LIPASE, AMYLASE in the last 168 hours. No results for input(s): AMMONIA in the last 168 hours. Coagulation Profile: No results for input(s): INR, PROTIME in the last 168 hours. Cardiac Enzymes: No results for input(s): CKTOTAL, CKMB, CKMBINDEX, TROPONINI in the last 168 hours. BNP (last 3 results) No results for input(s): PROBNP in the last 8760 hours. HbA1C: No results for input(s): HGBA1C in the last 72 hours. CBG: Recent Labs  Lab 09/20/18 0212  GLUCAP 109*   Lipid Profile: No results  for input(s): CHOL, HDL, LDLCALC, TRIG, CHOLHDL, LDLDIRECT in the last 72 hours. Thyroid Function Tests: No results for input(s): TSH, T4TOTAL, FREET4, T3FREE, THYROIDAB in the last 72 hours. Anemia Panel: No results for input(s): VITAMINB12, FOLATE, FERRITIN, TIBC, IRON, RETICCTPCT in the last 72 hours. Urine analysis:    Component Value Date/Time   COLORURINE STRAW (A) 12/21/2017 0344   APPEARANCEUR CLEAR 12/21/2017 0344   LABSPEC 1.009 12/21/2017 0344   PHURINE 6.0 12/21/2017 0344   GLUCOSEU NEGATIVE 12/21/2017 0344   HGBUR SMALL (A) 12/21/2017 0344   BILIRUBINUR NEGATIVE 12/21/2017 0344   KETONESUR NEGATIVE 12/21/2017 0344   PROTEINUR 100 (A) 12/21/2017 0344   UROBILINOGEN 0.2 05/09/2013 1857   NITRITE NEGATIVE 12/21/2017 0344   LEUKOCYTESUR NEGATIVE 12/21/2017  [place] on Admission: No results found.  EKG: Independently reviewed.  Assessment/Plan Principal Problem:   Acute renal failure superimposed on chronic kidney disease (HCC) Active Problems:   Chronic anticoagulation   Lupus anticoagulant positive   HTN (hypertension)   Seizures ([place])    1. AFK on CKD - either stage 4 or 5 1. Fistula surgery done end of Feb 2. But hasnt been superficialized yet 1. Actually looks like the vasc surg appointment is scheduled for 4/14 (presumably for this, though im not 100% sure of that). 3. Regardless, with bicarb of 11, BUN of 120 and creat of 22, suspect it may be time to start dialysis. 4. But will of course defer this decision to nephrology 5. Dr. Jimmy Footman has requested that we get patient admitted over to [facility] 6. And nephrology will see in AM 2. Seizures - 1. Likely due to missing seizure meds 2. Resume Lamictal 3. Got one time dose of Keppra at rec of Dr. Leonel Ramsay 4. Tele monitor 3. HTN - continue home norvasc and coreg 4. Lupus anticoagulant on chronic anticoagulation - 1. Holding eliquis 2. Using heparin gtt instead, in case he needs a dialysis  catheter placed.  DVT prophylaxis: Heparin gtt - instead of eliquis Code Status: Full Family Communication: No family in room Disposition Plan: Home after admit Consults called: Dr. Jimmy Footman called, nephrology to see in AM Admission status: Admit to inpatient  Severity of Illness: The appropriate patient status for this patient is INPATIENT. Inpatient status is judged to be reasonable and necessary in order to provide the required intensity of service to ensure the patient's safety. The patient's presenting symptoms, physical exam findings, and initial radiographic and laboratory data in the context of their chronic comorbidities is felt to place them at high risk for further clinical deterioration. Furthermore, it is not anticipated that the patient will be medically stable for discharge from the hospital within 2 midnights of admission. The following factors support the patient status of inpatient.   Inpatient status for AKF on CKD.  Likely will need initiation of dialysis.   * I certify that at the point of admission it is my clinical judgment that the patient will require inpatient hospital care spanning beyond 2 midnights from the point of admission due to high intensity of service, high risk for further deterioration and high frequency of surveillance required.*    GARDNER, JARED M. DO [facility]  How to contact the [facility] Attending or Consulting provider [place] or covering provider during after hours [place], for this patient?  1. Check the care team in [facility] and look for a) attending/consulting TR[facility] provider listed and b) the [facility] team listed 2. Log into [email or web address]  Amion Physician Scheduling and messaging for groups and whole hospitals  On call and physician scheduling software for group practices, residents, hospitalists and other medical providers for call, clinic, rotation and shift schedules. OnCall Enterprise is a hospital-wide system for scheduling doctors and paging  doctors on call. EasyPlot is for scientific plotting and data analysis.  [email or web address]  and use [place]'s universal password to access. If you do not have the password, please contact the hospital operator.  3. Locate the [facility] provider you are looking for under [facility] and page to a number that you can be directly reached. 4. If you still have difficulty reaching the provider, please page the [facility] (Director on Call) for the Hospitalists listed on amion for assistance.  09/20/2018, 5:00  AM

## 2018-09-20 NOTE — ED Notes (Signed)
Report called to [facility]  talked w nurse Cut[place]  Called [facility]  Paper work complete

## 2018-09-20 NOTE — ED Provider Notes (Signed)
[facility] DEPT Provider Note   CSN: [id number] Arrival date & time: 09/20/18  0149    History   Chief Complaint Chief Complaint  Patient presents with   Seizures   Fever    HPI [name] [name] is a 38 y.o. male.     38 year old male with history of epilepsy, CVA, hypertension, lupus, ESRD presents to the ED following seizure activity witnessed by friend.  Seizure lasted for approximately 5 minutes, per EMS, and occurred at the [place] on [address] at 0100.  Patient states that he has not been taking his Lamictal x 1-2 weeks because he has been working and "running around". Only pain is in his tongue which he bit during the seizure episode. No reported head trauma. No c/o headache. Patient states he feels fine and is "ready to go". Chronically anticoagulated with Eliquis.  S/p first stage brachiobasilic fistula on 6/59/93 to the LUE   Seizures  Fever    Past Medical History:  Diagnosis Date   Chronic kidney disease    Hypertension    Lupus ([place])    Seizures ([place])    Seizures ([place])    Stroke ([place])    Stroke ([place])    39 or 38 years of age.      Patient Active Problem List   Diagnosis Date Noted   ESRD (end stage renal disease) ([place]) 03/18/2018   Prediabetes 01/10/2018   ARF (acute renal failure) ([place]) 12/21/2017   Chest pain in adult    CKD (chronic kidney disease) stage 3, GFR 30-59 ml/min (HCC) 10/24/2015   Seizures ([place]) 10/24/2015   HTN (hypertension) 01/12/2015   Chronic anticoagulation 04/24/2011   Lupus anticoagulant positive 04/24/2011   OBESITY, NOS 08/15/2006   THROMBOCYTOPENIA 08/15/2006   TOBACCO DEPENDENCE 08/15/2006   CVA 08/15/2006    Past Surgical History:  Procedure Laterality Date   AV FISTULA PLACEMENT Left 12/30/2017   Procedure: ARTERIOVENOUS (AV) FISTULA CREATION VERSUS INSERTION OF ARTERIOVENOUS GRAFT LEFT ARM;  Surgeon: Rosetta Posner, MD;  Location: [facility] OR;  Service: Vascular;   Laterality: Left;   [place] Left 08/15/2018   Procedure: LEFT FIRST STAGE [place];  Surgeon: Rosetta Posner, MD;  Location: [facility] OR;  Service: Vascular;  Laterality: Left;   RENAL BIOPSY          Home Medications    Prior to Admission medications   Medication Sig Start Date End Date Taking? Authorizing Provider  amLODipine (NORVASC) 10 MG tablet Take 1 tablet (10 mg total) by mouth daily. 03/17/18  Yes Gildardo Pounds, NP  apixaban (ELIQUIS) 2.5 MG TABS tablet Take 1 tablet (2.5 mg total) by mouth 2 (two) times daily. 03/18/18 09/20/18 Yes Gildardo Pounds, NP  atorvastatin (LIPITOR) 40 MG tablet Take 1 tablet (40 mg total) by mouth daily. Patient taking differently: Take 40 mg by mouth every evening.  01/10/18  Yes Gildardo Pounds, NP  calcitRIOL (ROCALTROL) 0.25 MCG capsule Take 1 capsule (0.25 mcg total) by mouth daily. 12/27/17  Yes Oswald Hillock, MD  calcium acetate (PHOSLO) 667 MG capsule Take 2 capsules (1,334 mg total) by mouth 3 (three) times daily with meals. Take extra 1 capsule with snacks Patient taking differently: Take 667 mg by mouth See admin instructions. Take 667 mg with each meal and each snack 12/26/17  Yes Darrick Meigs, Marge Duncans, MD  carvedilol (COREG) 3.125 MG tablet Take 1 tablet (3.125 mg total) by mouth 2 (two) times daily  with a meal. 12/26/17  Yes Darrick Meigs, Marge Duncans, MD  LamoTRIgine (LAMICTAL XR) 300 MG TB24 24 hour tablet Take 1 tablet (300 mg total) by mouth daily. 01/10/18  Yes Gildardo Pounds, NP  hydrochlorothiazide (HYDRODIURIL) 25 MG tablet Take 1 tablet (25 mg total) by mouth daily. Patient not taking: Reported on 09/20/2018 03/17/18 08/14/18  Gildardo Pounds, NP  HYDROcodone-acetaminophen (NORCO) 5-325 MG tablet Take 1-2 tablets by mouth every 6 (six) hours as needed for moderate pain or severe pain. Patient not taking: Reported on 09/20/2018 08/15/18   Elam Dutch, MD    Family History Family History  Problem Relation Age of Onset     Diabetes Mother    Diabetes Maternal Grandmother     Social History Social History   Tobacco Use   Smoking status: Former Smoker    Packs/day: 0.25    Types: Cigarettes    Last attempt to quit: 12/21/2017    Years since quitting: 0.7   Smokeless tobacco: Never Used  Substance Use Topics   Alcohol use: Not Currently   Drug use: No     Allergies   Zyrtec [cetirizine]   Review of Systems Review of Systems  Constitutional: Positive for fever.  Neurological: Positive for seizures.  Ten systems reviewed and are negative for acute change, except as noted in the HPI.     Physical Exam Updated Vital Signs BP (!) 152/101 (BP Location: Right Arm) Comment: pt not keeping arm stilll wants pain meds    Pulse 95    Temp 98.9 F (37.2 C) (Oral)    Resp 18    Ht 6\' 1"  (1.854 m)    Wt 113.4 kg    SpO2 95%    BMI 32.98 kg/m   Physical Exam Vitals signs and nursing note reviewed.  Constitutional:      General: He is not in acute distress.    Appearance: He is well-developed. He is not diaphoretic.  HENT:     Head: Normocephalic.     Comments: No battle sign or raccoon's eyes.  No hematoma or contusion to scalp.    Right Ear: External ear normal.     Left Ear: External ear normal.     Mouth/Throat:     Comments: No laceration to lip.  Jaw opening is symmetric.  There are lacerations to lateral aspect of tongue bilaterally.  Patient tolerating secretions. Eyes:     General: No scleral icterus.    Extraocular Movements: Extraocular movements intact.     Conjunctiva/sclera: Conjunctivae normal.     Pupils: Pupils are equal, round, and reactive to light.  Neck:     Musculoskeletal: Normal range of motion.  Cardiovascular:     Rate and Rhythm: Regular rhythm. Tachycardia present.     Pulses: Normal pulses.  Pulmonary:     Effort: Pulmonary effort is normal. No respiratory distress.     Comments: Respirations even and unlabored Musculoskeletal: Normal range of motion.   Skin:    General: Skin is warm and dry.     Coloration: Skin is not pale.     Findings: No erythema or rash.  Neurological:     Mental Status: He is alert and oriented to person, place, and time.     Coordination: Coordination normal.     Comments: GCS 15.  Patient answers questions appropriately and follows commands.  Alert to person, place, time, president.  No gross focal deficits noted.  He is moving all of his extremities spontaneously.  Able to transition and roll over in the bed without assistance.  Psychiatric:        Behavior: Behavior normal.      ED Treatments / Results  Labs (all labs ordered are listed, but only abnormal results are displayed) Labs Reviewed  CBC - Abnormal; Notable for the following components:      Result Value   WBC 11.7 (*)    RBC 2.85 (*)    Hemoglobin 8.6 (*)    HCT 26.4 (*)    Platelets 133 (*)    All other components within normal limits  COMPREHENSIVE METABOLIC PANEL - Abnormal; Notable for the following components:   CO2 11 (*)    Glucose, Bld 131 (*)    BUN 116 (*)    Creatinine, Ser 22.28 (*)    Calcium 8.4 (*)    Albumin 3.3 (*)    GFR calc non Af Amer 2 (*)    GFR calc Af Amer 3 (*)    Anion gap 19 (*)    All other components within normal limits  CBG MONITORING, ED - Abnormal; Notable for the following components:   Glucose-Capillary 109 (*)    All other components within normal limits  LAMOTRIGINE LEVEL    EKG None  Radiology No results found.  Procedures Procedures (including critical care time)  Medications Ordered in ED Medications  lamoTRIgine (LAMICTAL) tablet 150 mg (150 mg Oral Given 09/20/18 0246)  sodium chloride 0.9 % bolus 1,000 mL (0 mLs Intravenous Stopped 09/20/18 0351)  levETIRAcetam (KEPPRA) IVPB 1000 mg/100 mL premix (0 mg Intravenous Stopped 09/20/18 0351)  HYDROmorphone (DILAUDID) injection 0.5 mg (0.5 mg Intravenous Given 09/20/18 0359)     Initial Impression / Assessment and Plan / ED Course  I  have reviewed the triage vital signs and the nursing notes.  Pertinent labs & imaging results that were available during my care of the patient were reviewed by me and considered in my medical decision making (see chart for details).        2:16 AM Patient presents to the emergency department following a 5-minute seizure this evening while at [place] with a friend.  History of epilepsy and reports noncompliance with his Lamictal.  Will obtain labs to evaluate electrolyte derangements.  Patient ordered to receive IV fluids for management of tachycardia.  3:37 AM Patient has received approximately 300 cc of IV fluids.  These have been discontinued in light of tachycardia resolution.  Discussed with patient my concern about his metabolic acidosis secondary to acute on chronic renal failure.  His labs today suggest need for dialysis.  He has not yet required initiation of dialysis.  Is currently opposed to admission.  Requests to ambulate to the restroom.  Will resume discussion when patient returns to his room.  3:58 AM Patient now agreeable to admission. Requests that he have 1 visitor as he is anxious about starting the dialysis process alone. This request will be relayed on to admitting team.  4:33 AM Case discussed with Dr. Alcario Drought of [facility] who requests nephrology consultation prior to admission orders given anticipated need for dialysis.  4:39 AM Case discussed with Dr. Jimmy Footman. Nephrology to consult on patient case upon his arrival to [facility].   Final Clinical Impressions(s) / ED Diagnoses   Final diagnoses:  Seizure ([place])  Acute renal failure superimposed on stage 5 chronic kidney disease, not on chronic dialysis, unspecified acute renal failure type (HCC)  Increased anion gap metabolic acidosis  Laceration of tongue,  initial encounter    ED Discharge Orders    None       Antonietta Breach, PA-C 09/20/18 0443    Shanon Rosser, MD 09/20/18 [phone]

## 2018-09-20 NOTE — Progress Notes (Signed)
Patient arrived back to the unit Alert but sleepy Alert and oriented X 4 Nurse will continue to monitor

## 2018-09-20 NOTE — ED Notes (Signed)
ED TO INPATIENT HANDOFF REPORT  ED Nurse Name and Phone #:  Laurelyn Sickle [phone]  S Name/Age/Gender [name] [name] 38 y.o. male Room/Bed: WA12/WA12  Code Status   Code Status: Full Code  Home/SNF/Other Home {Patient oriented yes  Is this baseline?  Yes   Triage Complete: Triage complete  Chief Complaint Seizures  Triage Note Pt comes to ed via ems, c/o seizure witnessed for 5 min by friend, happened at sheet on [address] at 1 am. Pt has small lac on lip/ bite his tongue. V/s pluses 140 , cbg 145, temp 101.5, rr18. Medical hx of seizures.       Allergies Allergies  Allergen Reactions  . Zyrtec [Cetirizine] Swelling    Facial swelling    Level of Care/Admitting Diagnosis ED Disposition    ED Disposition Condition Comment   Admit  Hospital Area: [facility] [[id number]]  Level of Care: Telemetry Medical [104]  Diagnosis: Acute renal failure superimposed on chronic kidney disease, unspecified CKD stage, unspecified acute renal failure type [facility]) [[id number]]  Admitting Physician: Doreatha Massed  Attending Physician: Etta Quill [phone]  Estimated length of stay: past midnight tomorrow  Certification:: I certify this patient will need inpatient services for at least 2 midnights  PT Class (Do Not Modify): Inpatient [101]  PT Acc Code (Do Not Modify): Private [1]       B Medical/Surgery History Past Medical History:  Diagnosis Date  . Chronic kidney disease   . Hypertension   . Lupus ([place])   . Seizures ([place])   . Seizures ([place])   . Stroke ([place])   . Stroke [facility])    28 or 38 years of age.     Past Surgical History:  Procedure Laterality Date  . AV FISTULA PLACEMENT Left 12/30/2017   Procedure: ARTERIOVENOUS (AV) FISTULA CREATION VERSUS INSERTION OF ARTERIOVENOUS GRAFT LEFT ARM;  Surgeon: Rosetta Posner, MD;  Location: [facility];  Service: Vascular;  Laterality: Left;  . BASCILIC VEIN TRANSPOSITION Left 08/15/2018   Procedure: LEFT FIRST  STAGE BASCILIC VEIN TRANSPOSITION;  Surgeon: Rosetta Posner, MD;  Location: [place];  Service: Vascular;  Laterality: Left;  . RENAL BIOPSY       A IV Location/Drains/Wounds Patient Lines/Drains/Airways Status   Active Line/Drains/Airways    Name:   Placement date:   Placement time:   Site:   Days:   Peripheral IV 09/20/18 Right Antecubital   09/20/18    0219    Antecubital   less than 1   Fistula / Graft Left Forearm Arteriovenous fistula   12/30/17    1228    Forearm   264   Incision (Closed) 12/30/17 Arm Left   12/30/17    1254     264   Incision (Closed) 08/15/18 Arm Left   08/15/18    1038     36          Intake/Output Last 24 hours No intake or output data in the 24 hours ending 09/20/18 0532  Labs/Imaging Results for orders placed or performed during the hospital encounter of 09/20/18 (from the past 48 hour(s))  CBG monitoring, ED     Status: Abnormal   Collection Time: 09/20/18  2:12 AM  Result Value Ref Range   Glucose-Capillary 109 (H) 70 - 99 mg/dL  CBC     Status: Abnormal   Collection Time: 09/20/18  2:14 AM  Result Value Ref Range   WBC 11.7 (H) 4.0 -  10.5 K/uL   RBC 2.85 (L) 4.22 - 5.81 MIL/uL   Hemoglobin 8.6 (L) 13.0 - 17.0 g/dL   HCT 26.4 (L) 39.0 - 52.0 %   MCV 92.6 80.0 - 100.0 fL   MCH 30.2 26.0 - 34.0 pg   MCHC 32.6 30.0 - 36.0 g/dL   RDW 14.6 11.5 - 15.5 %   Platelets 133 (L) 150 - 400 K/uL   nRBC 0.0 0.0 - 0.2 %    Comment: Performed at [facility], [place] [address], [place], [place] [address]  Comprehensive metabolic panel     Status: Abnormal   Collection Time: 09/20/18  2:14 AM  Result Value Ref Range   Sodium 138 135 - 145 mmol/L   Potassium 4.0 3.5 - 5.1 mmol/L   Chloride 108 98 - 111 mmol/L   CO2 11 (L) 22 - 32 mmol/L   Glucose, Bld 131 (H) 70 - 99 mg/dL   BUN 116 (H) 6 - 20 mg/dL    Comment: RESULTS CONFIRMED BY MANUAL DILUTION   Creatinine, Ser 22.28 (H) 0.61 - 1.24 mg/dL   Calcium 8.4 (L) 8.9 - 10.3 mg/dL   Total  Protein 7.7 6.5 - 8.1 g/dL   Albumin 3.3 (L) 3.5 - 5.0 g/dL   AST 15 15 - 41 U/L   ALT 15 0 - 44 U/L   Alkaline Phosphatase 91 38 - 126 U/L   Total Bilirubin 0.5 0.3 - 1.2 mg/dL   GFR calc non Af Amer 2 (L) >60 mL/min   GFR calc Af Amer 3 (L) >60 mL/min   Anion gap 19 (H) 5 - 15    Comment: Performed at [facility], [place] [address] [place],  [address]   No results found.  Pending Labs Unresulted Labs (From admission, onward)    Start     Ordered   09/20/18 0507  Heparin level (unfractionated)  Once-Timed,   R     09/20/18 0506   09/20/18 0507  APTT  ONCE - STAT,   R     09/20/18 0506   09/20/18 0507  Protime-INR  ONCE - STAT,   R     09/20/18 0506   09/20/18 0452  HIV antibody (Routine Testing)  Once,   R     09/20/18 0459   09/20/18 0156  Lamotrigine level  Once,   R     09/20/18 0155          Vitals/Pain Today's Vitals   09/20/18 0226 09/20/18 0317 09/20/18 0401 09/20/18 0530  BP:  (!) 152/101 (!) 162/106   Pulse:  95 95   Resp:  18 20   Temp: 98.2 F (36.8 C) 98.9 F (37.2 C) 98.7 F (37.1 C)   TempSrc: Rectal Oral Oral   SpO2:  95% 95%   Weight:      Height:      PainSc:  8  Asleep 1     Isolation Precautions No active isolations  Medications Medications  lamoTRIgine (LAMICTAL) tablet 150 mg (150 mg Oral Given 09/20/18 0246)  acetaminophen (TYLENOL) tablet 650 mg (has no administration in time range)    Or  acetaminophen (TYLENOL) suppository 650 mg (has no administration in time range)  ondansetron (ZOFRAN) tablet 4 mg (has no administration in time range)    Or  ondansetron (ZOFRAN) injection 4 mg (has no administration in time range)  amLODipine (NORVASC) tablet 10 mg (has no administration in time range)  atorvastatin (LIPITOR) tablet 40  mg (has no administration in time range)  calcitRIOL (ROCALTROL) capsule 0.25 mcg (has no administration in time range)  calcium acetate (PHOSLO) capsule 667 mg (has no administration in  time range)  carvedilol (COREG) tablet 3.125 mg (has no administration in time range)  sodium chloride 0.9 % bolus 1,000 mL (0 mLs Intravenous Stopped 09/20/18 0351)  levETIRAcetam (KEPPRA) IVPB 1000 mg/100 mL premix (0 mg Intravenous Stopped 09/20/18 0351)  HYDROmorphone (DILAUDID) injection 0.5 mg (0.5 mg Intravenous Given 09/20/18 0359)    Mobility walks Moderate fall risk   Focused Assessments Renal Assessment Handoff:  Hemodialysis Schedule:  Last Hemodialysis date and time:    Restricted appendage: left arm      R Recommendations: See Admitting Provider Note  Report given to:   Additional Notes:

## 2018-09-20 NOTE — Progress Notes (Signed)
PROGRESS NOTE    [name] [name]  YJE:[id number] DOB: 1981/05/31 DOA: 09/20/2018 PCP: Gildardo Pounds, NP   Brief Narrative:  On 09/20/2018 by Dr. Jennette Kettle [name] [name] is a 38 y.o. male with medical history significant of SLE, stroke, lupus anticoagulant on chronic eliquis, advanced CKD, presumably stage 5, just had initial dialysis fistula surgery in Feb.  Patient has been missing his Lamictal for past 1-2 weeks.  Today had a seizure, and brought in to ED. Assessment & Plan   Admitted earlier today by Dr. Jennette Kettle. See H&P for details.  Acute kidney injury on chronic kidney disease, stage V -Patient does have fistula (placed in February 2020) however still not mature -Nephrology consulted and appreciated -Presented with a creatinine of 22.28, baseline appears to be approximately 10 -Monitor intake and output  Seizure disorder -Secondary to missed seizure medications -Patient received Keppra at the recommendation of neurology -Continue Lamictal -Continue seizure precautions  Essential hypertension -Continue Norvasc, Coreg  Lupus anticoagulant -Eliquis currently held, placed on heparin drip  DVT Prophylaxis  heparin  Code Status: Full  Family Communication: none at bedside  Disposition Plan: Admitted. Dispo TBD  Consultants Nephrology Neurology by EDP  Procedures  None  Antibiotics   Anti-infectives (From admission, onward)   None      Subjective:   [name] [name] seen and examined today.  Patient states he is sleepy  Objective:   Vitals:   09/20/18 0500 09/20/18 0545 09/20/18 0643 09/20/18 0837  BP: (!) 159/87 (!) 155/99 (!) 165/89 (!) 137/103  Pulse: 88 82 77 78  Resp: 18 17 19 19   Temp:   98.3 F (36.8 C) 98.3 F (36.8 C)  TempSrc:   Oral Oral  SpO2: (!) 86% 91% 98% 99%  Weight:   121.7 kg   Height:       No intake or output data in the 24 hours ending 09/20/18 0924 Filed Weights   09/20/18 0207 09/20/18 0643  Weight: 113.4 kg  121.7 kg    Exam No Exam as patient admitted earlier today.  Appears to be comfortable.   Data Reviewed: I have personally reviewed following labs and imaging studies  CBC: Recent Labs  Lab 09/20/18 0214  WBC 11.7*  HGB 8.6*  HCT 26.4*  MCV 92.6  PLT 637*   Basic Metabolic Panel: Recent Labs  Lab 09/20/18 0214  NA 138  K 4.0  CL 108  CO2 11*  GLUCOSE 131*  BUN 116*  CREATININE 22.28*  CALCIUM 8.4*   GFR: Estimated Creatinine Clearance: 6.1 mL/min (A) (by C-G formula based on SCr of 22.28 mg/dL (H)). Liver Function Tests: Recent Labs  Lab 09/20/18 0214  AST 15  ALT 15  ALKPHOS 91  BILITOT 0.5  PROT 7.7  ALBUMIN 3.3*   No results for input(s): LIPASE, AMYLASE in the last 168 hours. No results for input(s): AMMONIA in the last 168 hours. Coagulation Profile: No results for input(s): INR, PROTIME in the last 168 hours. Cardiac Enzymes: No results for input(s): CKTOTAL, CKMB, CKMBINDEX, TROPONINI in the last 168 hours. BNP (last 3 results) No results for input(s): PROBNP in the last 8760 hours. HbA1C: No results for input(s): HGBA1C in the last 72 hours. CBG: Recent Labs  Lab 09/20/18 0212  GLUCAP 109*   Lipid Profile: No results for input(s): CHOL, HDL, LDLCALC, TRIG, CHOLHDL, LDLDIRECT in the last 72 hours. Thyroid Function Tests: No results for input(s): TSH, T4TOTAL, FREET4, T3FREE, THYROIDAB in the last 72 hours.  Anemia Panel: No results for input(s): VITAMINB12, FOLATE, FERRITIN, TIBC, IRON, RETICCTPCT in the last 72 hours. Urine analysis:    Component Value Date/Time   COLORURINE STRAW (A) 12/21/2017 0344   APPEARANCEUR CLEAR 12/21/2017 0344   LABSPEC 1.009 12/21/2017 0344   PHURINE 6.0 12/21/2017 0344   GLUCOSEU NEGATIVE 12/21/2017 0344   HGBUR SMALL (A) 12/21/2017 0344   BILIRUBINUR NEGATIVE 12/21/2017 0344   KETONESUR NEGATIVE 12/21/2017 0344   PROTEINUR 100 (A) 12/21/2017 0344   UROBILINOGEN 0.2 05/09/2013 1857   NITRITE NEGATIVE  12/21/2017 0344   LEUKOCYTESUR NEGATIVE 12/21/2017 0344   Sepsis Labs: @LABRCNTIP (procalcitonin:4,lacticidven:4)  )No results found for this or any previous visit (from the past 240 hour(s)).    Radiology Studies: No results found.   Scheduled Meds: . amLODipine  10 mg Oral Daily  . atorvastatin  40 mg Oral QPM  . calcitRIOL  0.25 mcg Oral Daily  . calcium acetate  667 mg Oral TID WC  . carvedilol  3.125 mg Oral BID WC  . lamoTRIgine  150 mg Oral BID   Continuous Infusions: . heparin 1,300 Units/hr (09/20/18 0701)     LOS: 0 days   Time Spent in minutes   30 minutes  Jenna Routzahn D.O. on 09/20/2018 at 9:24 AM  Between 7am to 7pm - Please see pager noted on [email or web address]  After 7pm go to [email or web address]  And look for the night coverage person covering for me after hours  [facility] Office  [phone]

## 2018-09-20 NOTE — ED Notes (Signed)
Bed: FQ42 Expected date:  Expected time:  Means of arrival:  Comments: 30s seizure

## 2018-09-21 ENCOUNTER — Encounter (HOSPITAL_COMMUNITY): Payer: Self-pay | Admitting: Vascular Surgery

## 2018-09-21 LAB — HEPARIN LEVEL (UNFRACTIONATED)
Heparin Unfractionated: <0.1 IU/mL — ABNORMAL LOW (ref 0.30–0.70)
Heparin Unfractionated: 0.11 IU/mL — ABNORMAL LOW (ref 0.30–0.70)

## 2018-09-21 LAB — CBC
HCT: 25.7 % — ABNORMAL LOW (ref 39.0–52.0)
Hemoglobin: 8.5 g/dL — ABNORMAL LOW (ref 13.0–17.0)
MCH: 29.1 pg (ref 26.0–34.0)
MCHC: 33.1 g/dL (ref 30.0–36.0)
MCV: 88 fL (ref 80.0–100.0)
Platelets: 125 10*3/uL — ABNORMAL LOW (ref 150–400)
RBC: 2.92 MIL/uL — ABNORMAL LOW (ref 4.22–5.81)
RDW: 14.2 % (ref 11.5–15.5)
WBC: 6 10*3/uL (ref 4.0–10.5)
nRBC: 0 % (ref 0.0–0.2)

## 2018-09-21 LAB — BASIC METABOLIC PANEL
Anion gap: 11 (ref 5–15)
BUN: 76 mg/dL — ABNORMAL HIGH (ref 6–20)
CO2: 20 mmol/L — ABNORMAL LOW (ref 22–32)
Calcium: 8.2 mg/dL — ABNORMAL LOW (ref 8.9–10.3)
Chloride: 104 mmol/L (ref 98–111)
Creatinine, Ser: 15.98 mg/dL — ABNORMAL HIGH (ref 0.61–1.24)
GFR calc Af Amer: 4 mL/min — ABNORMAL LOW (ref >60)
GFR calc non Af Amer: 3 mL/min — ABNORMAL LOW (ref >60)
Glucose, Bld: 103 mg/dL — ABNORMAL HIGH (ref 70–99)
Potassium: 3.3 mmol/L — ABNORMAL LOW (ref 3.5–5.1)
Sodium: 135 mmol/L (ref 135–145)

## 2018-09-21 LAB — HEPATITIS B SURFACE ANTIGEN: Hepatitis B Surface Ag: NEGATIVE

## 2018-09-21 LAB — IRON AND TIBC
Iron: 70 ug/dL (ref 45–182)
Saturation Ratios: 36 % (ref 17.9–39.5)
TIBC: 196 ug/dL — ABNORMAL LOW (ref 250–450)
UIBC: 126 ug/dL

## 2018-09-21 LAB — HEPATITIS B CORE ANTIBODY, TOTAL: Hep B Core Total Ab: NEGATIVE

## 2018-09-21 LAB — HEPATITIS B SURFACE ANTIBODY,QUALITATIVE: Hep B S Ab: NONREACTIVE

## 2018-09-21 LAB — LAMOTRIGINE LEVEL: Lamotrigine Lvl: 2.1 ug/mL (ref 2.0–20.0)

## 2018-09-21 MED ORDER — DARBEPOETIN ALFA 60 MCG/0.3ML IJ SOSY: 60.0000 ug | PREFILLED_SYRINGE | INTRAMUSCULAR | Status: DC

## 2018-09-21 MED ORDER — DARBEPOETIN ALFA 60 MCG/0.3ML IJ SOSY
PREFILLED_SYRINGE | INTRAMUSCULAR | Status: AC
  Administered 2018-09-21: 60 ug via INTRAVENOUS
  Filled 2018-09-21: qty 0.3

## 2018-09-21 MED ORDER — CALCITRIOL 0.25 MCG PO CAPS
ORAL_CAPSULE | ORAL | Status: AC
  Administered 2018-09-21: 0.25 ug via ORAL
  Filled 2018-09-21: qty 1

## 2018-09-21 MED ORDER — TRAMADOL HCL 50 MG PO TABS: 50.0000 mg | ORAL_TABLET | Freq: Two times a day (BID) | ORAL | Status: DC | PRN

## 2018-09-21 MED ORDER — APIXABAN 2.5 MG PO TABS
2.5000 mg | ORAL_TABLET | Freq: Two times a day (BID) | ORAL | Status: DC
  Administered 2018-09-21 – 2018-09-23 (×4): 2.5 mg via ORAL
  Filled 2018-09-21 (×4): qty 1

## 2018-09-21 MED ORDER — DARBEPOETIN ALFA 60 MCG/0.3ML IJ SOSY
60.0000 ug | PREFILLED_SYRINGE | Freq: Once | INTRAMUSCULAR | Status: AC
  Administered 2018-09-21: 11:00:00 60 ug via INTRAVENOUS

## 2018-09-21 NOTE — Progress Notes (Signed)
[facility]  Subjective  - No complaints.  RIJ tunneled catheter worked well in dialysis yesterday.   Objective (!) 170/108 69 98.3 F (36.8 C) (Oral) 14 100%  Intake/Output Summary (Last 24 hours) at 09/21/2018 0815 Last data filed at 09/20/2018 1730 Gross per 24 hour  Intake 300 ml  Output 1002 ml  Net -702 ml    RIJ tunneled dialysis catheter, no drainage, no hematoma Left brachiobasilic fistula with good thrill  Laboratory Lab Results: Recent Labs    09/20/18 0214 09/21/18 0234  WBC 11.7* 6.0  HGB 8.6* 8.5*  HCT 26.4* 25.7*  PLT 133* 125*   BMET Recent Labs    09/20/18 0214 09/21/18 0234  NA 138 135  K 4.0 3.3*  CL 108 104  CO2 11* 20*  GLUCOSE 131* 103*  BUN 116* 76*  CREATININE 22.28* 15.98*  CALCIUM 8.4* 8.2*    COAG Lab Results  Component Value Date   INR 1.06 12/30/2017   INR 1.03 12/24/2017   INR 1.14 12/22/2017   PROTIME 28.8 (H) 11/04/2013   PROTIME 21.6 (H) 10/14/2013   PROTIME 18.0 (H) 09/23/2013   No results found for: PTT  Assessment/Planning: Doing well POD#1 s/p insertion RIJ tunneled dialysis catheter.  Worked well in dialysis yesterday.  Call vascular if issues moving forward.  Will ensure he has follow-up with Dr. Donnetta Hutching to evaluate for second stage left brachiobasilic fistula.  Marty Heck 09/21/2018 8:15 AM --

## 2018-09-21 NOTE — Progress Notes (Signed)
[place] for heparin Indication: hxo CVA/lupus AC on apixaban  Allergies  Allergen Reactions  . Zyrtec [Cetirizine] Swelling    Facial swelling    Patient Measurements: Height: 6\' 1"  (185.4 cm) Weight: 254 lb 13.6 oz (115.6 kg) IBW/kg (Calculated) : 79.9 Heparin Dosing Weight: 105.4 kg  Vital Signs: Temp: 97.9 F (36.6 C) (04/05 1210) Temp Source: Oral (04/05 1210) BP: 151/96 (04/05 1210) Pulse Rate: 83 (04/05 1210)  Labs: Recent Labs    09/20/18 0214 09/20/18 1536 09/21/18 0234  HGB 8.6*  --  8.5*  HCT 26.4*  --  25.7*  PLT 133*  --  125*  APTT  --  57*  --   HEPARINUNFRC  --  <0.10* <0.10*  CREATININE 22.28*  --  15.98*    Estimated Creatinine Clearance: 8.3 mL/min (A) (by C-G formula based on SCr of 15.98 mg/dL (H)).   Medical History: Past Medical History:  Diagnosis Date  . Chronic kidney disease   . Hypertension   . Lupus ([place])   . Seizures ([place])   . Seizures ([place])   . Stroke ([place])   . Stroke [facility])    73 or 38 years of age.      Assessment: 66 yoM on apixaban 2.5 mg PO BID PTA for hxo CVA/lupus anticoagulant. Unsure of last dose of apixaban, per patient it was within the last week, so likely apixaban has cleared, noted history of medication non-compliance.  Patient s/p placement of TDC on 4/4 AM.   Heparin level remains subtherapeutic at 0.11 units/ml on 1800 units/hr. Patient's Hgb 8.5, PTLC 125. No issues with infusion or s/sx of bleeding per RN.   Goal of Therapy:  Heparin level 0.3-0.7 units/ml aPTT 66-102 seconds Monitor platelets by anticoagulation protocol: Yes   Plan:  Increase heparin infusion to 2100 units/hr Stop heparin infusion today (09/21/18) at 22:00 Resume apixaban 2.5 mg PO BID today at time of heparin infusion discontinuation (09/21/18 at 22:00)  Thank you for allowing pharmacy to be a part of this patient's care.  Leron Croak, PharmD PGY1 Pharmacy Resident  Please check AMION  for all [facility] Pharmacy phone numbers

## 2018-09-21 NOTE — Progress Notes (Signed)
[place] for heparin Indication: hxo CVA/lupus AC on apixaban  Allergies  Allergen Reactions  . Zyrtec [Cetirizine] Swelling    Facial swelling    Patient Measurements: Height: 6\' 1"  (185.4 cm) Weight: 268 lb 4.8 oz (121.7 kg) IBW/kg (Calculated) : 79.9 Heparin Dosing Weight: 103.9 kg  Vital Signs:    Labs: Recent Labs    09/20/18 0214 09/20/18 1536 09/21/18 0234  HGB 8.6*  --  8.5*  HCT 26.4*  --  25.7*  PLT 133*  --  125*  APTT  --  57*  --   HEPARINUNFRC  --  <0.10* <0.10*  CREATININE 22.28*  --  15.98*    Estimated Creatinine Clearance: 8.5 mL/min (A) (by C-G formula based on SCr of 15.98 mg/dL (H)).   Medical History: Past Medical History:  Diagnosis Date  . Chronic kidney disease   . Hypertension   . Lupus ([place])   . Seizures ([place])   . Seizures ([place])   . Stroke ([place])   . Stroke [facility])    20 or 38 years of age.      Assessment: 21 yoM on apixaban 2.5 mg PO BID PTA for hxo CVA/lupus anticoagulant. Unsure of last dose of apixaban, per patient it was within the last week, so likely apixaban has cleared, noted history of medication non-compliance.  Patient s/p placement of TDC on 4/4 AM.   Heparin level this am <0.1 units/ml Hg 8.5, PTLC 125  Goal of Therapy:  Heparin level 0.3-0.7 units/ml aPTT 66-102 seconds Monitor platelets by anticoagulation protocol: Yes   Plan:  Increase heparin infusion to 1800 units/hr Check anti-Xa level in 8 hours Continue to monitor H&H and platelets  F/u plans to transition back to apixaban  Thanks for allowing pharmacy to be a part of this patient's care.  Excell Seltzer, PharmD Clinical Pharmacist

## 2018-09-21 NOTE — Progress Notes (Addendum)
[facility] Progress Note  Subjective: no c/o, had 1st HD yesterday w/o incident.  Plan HD today.   Vitals:   09/21/18 0900 09/21/18 0909 09/21/18 0915 09/21/18 0930  BP: (!) 174/116 (!) 165/106 (!) 165/106 (!) 169/104  Pulse: 81 80 80 83  Resp: 16     Temp: 98.1 F (36.7 C)     TempSrc: Oral     SpO2: 96%     Weight: 118.3 kg     Height:        Inpatient medications: . amLODipine  10 mg Oral Daily  . atorvastatin  40 mg Oral QPM  . calcitRIOL  0.25 mcg Oral Daily  . calcium acetate  667 mg Oral TID WC  . carvedilol  3.125 mg Oral BID WC  . Chlorhexidine Gluconate Cloth  6 each Topical Q0600  . lamoTRIgine  150 mg Oral BID   . sodium chloride 10 mL/hr at 09/20/18 1004  . heparin 1,300 Units/hr (09/20/18 0701)   acetaminophen **OR** acetaminophen, calcium acetate, ondansetron **OR** ondansetron (ZOFRAN) IV, traMADol    Exam: Gen alert, up walking in the room No jvd or bruits Chest clear bilat no rales or wheezing RRR no MRG Abd soft ntnd no mass or ascites +bs Ext no LE or UE edema Neuro is alert, Ox 3 , nf LUA AVF+bruit    Home meds:  - amlodipine 10/ carvedilol 3.125 bid/ hydrochlorothiazide 25 qd  - lamotrigine xr 300/ day  - calc acetate ac tid  - atorvastatin 40  - apixaban 2.5 bid    Assessment/ Plan: 1. CKD stage V - with known CKD prior creat 11, now w/ seizures and creat 22, has hx of seizures but presumably pt uremic and was started on HD yesterday 4/4.  Plan HD again today and tomorrow then should be able to go to tiw. Will need CLIP to OP HD. TDC placed 4/4 by [facility], appreciate assistance. Has LUA AVF created 08/15/18.  2. HTN - cont meds. BP's up, UF 2-3 L today on HD.  3. H/o SLE  4. Antiphospholipid antibody syndrome - dx'd at time of 1st CVA in 2003; started on coumadin then; in 2005 noncompliant w/ meds and had 2nd CVA. Is now on eliquis. Getting IV heparin here. No further procedures anticipated, could switch back to Eliquis  from renal standpoint.  5. Seizure d/o - on lamictal, ran out 2 wks ago 6. Anemia ckd -  Hb 8.6, check fe/ tibc, start darbe 60 ug today   [facility] 09/21/2018, 10:04 AM  Iron/TIBC/Ferritin/ %Sat    Component Value Date/Time   IRON 84 08/29/2018 1228   TIBC 230 (L) 08/29/2018 1228   FERRITIN 303 08/29/2018 1228   IRONPCTSAT 37 08/29/2018 1228   Recent Labs  Lab 09/20/18 0214 09/21/18 0234  NA 138 135  K 4.0 3.3*  CL 108 104  CO2 11* 20*  GLUCOSE 131* 103*  BUN 116* 76*  CREATININE 22.28* 15.98*  CALCIUM 8.4* 8.2*  ALBUMIN 3.3*  --    Recent Labs  Lab 09/20/18 0214  AST 15  ALT 15  ALKPHOS 91  BILITOT 0.5  PROT 7.7   Recent Labs  Lab 09/21/18 0234  WBC 6.0  HGB 8.5*  HCT 25.7*  PLT 125*

## 2018-09-21 NOTE — Progress Notes (Signed)
PROGRESS NOTE    [name] [name]  XBJ:[id number] DOB: 1981-01-20 DOA: 09/20/2018 PCP: [name] Pounds, NP   Brief Narrative:  On 09/20/2018 by Dr. Jennette [name] [name] [name] is a 38 y.o. male with medical history significant of SLE, stroke, lupus anticoagulant on chronic eliquis, advanced CKD, presumably stage 5, just had initial dialysis fistula surgery in Feb.  Patient has been missing his Lamictal for past 1-2 weeks.  Today had a seizure, and brought in to ED.  Interim history Admitted for AKI/CKD, started HD.  Assessment & Plan   Acute kidney injury on chronic kidney disease, stage V -Patient does have fistula (placed in February 2020) however still not mature -Nephrology consulted and appreciated -Presented with a creatinine of 22.28, baseline appears to be approximately 10 -Vascular surgery consulted and appreciated, s/p R IJ vein cannulation under ultrasound guidance, tunneled dialysis catheter placement -Patient also had dialysis on 09/20/2018 -Creatinine currently 15.98 -Monitor intake and output -Continue to monitor BMP  Seizure disorder -Secondary to missed seizure medications -Patient received Keppra at the recommendation of neurology -Continue Lamictal -Continue seizure precautions  Essential hypertension -Continue Norvasc, Coreg  Lupus anticoagulant -Eliquis currently held, placed on heparin drip  Anemia of chronic disease -hemoglobin appears to be stable, continue to monitor CBC  Headache -Patient states he usually takes NSAIDs for his headache.  Throbbing in nature. -Continue Tylenol and tramadol as needed  DVT Prophylaxis  heparin  Code Status: Full  Family Communication: none at bedside  Disposition Plan: Admitted. Dispo TBD- pending nephro recommendations  Consultants Nephrology Neurology by [name] [name] Vascular surgery  Procedures  1. Right internal jugular vein cannulation under ultrasound guidance 2. Right internal jugular vein tunneled  dialysis catheter placement (23 cm Palindrome catheter)  Antibiotics   Anti-infectives (From admission, onward)   Start     Dose/Rate Route Frequency Ordered Stop   09/20/18 1000  ceFAZolin (ANCEF) 3 g in dextrose 5 % 50 mL IVPB     3 g 100 mL/hr over 30 Minutes Intravenous  Once 09/20/18 0948 09/20/18 1107   09/20/18 0949  ceFAZolin (ANCEF) 2-4 GM/100ML-% IVPB    Note to Pharmacy:  [name] [name]   : cabinet override      09/20/18 0949 09/20/18 2159      Subjective:   [name] [name] seen and examined today.  Complains of headache.  Did not like dialyzing yesterday.  Denies current chest pain, shortness of breath, abdominal pain, nausea or vomiting, diarrhea constipation. Objective:   Vitals:   09/20/18 1600 09/20/18 1630 09/20/18 1700 09/20/18 1730  BP: (!) 148/94 (!) 154/101 (!) 168/102 (!) 170/108  Pulse: 62 66 67 69  Resp: 14 13 12 14   Temp:    98.3 F (36.8 C)  TempSrc:    Oral  SpO2: 100% 100% 100% 100%  Weight:      Height:        Intake/Output Summary (Last 24 hours) at 09/21/2018 3086 Last data filed at 09/20/2018 1730 Gross per 24 hour  Intake 300 ml  Output 1002 ml  Net -702 ml   Filed Weights   09/20/18 0207 09/20/18 0643  Weight: 113.4 kg 121.7 kg   Exam  General: Well developed, well nourished, NAD, appears stated age  27: NCAT,  mucous membranes moist.   Neck: Supple  Cardiovascular: S1 S2 auscultated, RRR  Respiratory: Clear to auscultation bilaterally with equal chest rise  Abdomen: Soft, nontender, nondistended, + bowel sounds  Extremities: warm dry without cyanosis clubbing or edema  of LE. Mild RUE edema  Neuro: AAOx3, nonfocal  Psych: Normal affect and demeanor with intact judgement and insight  Data Reviewed: I have personally reviewed following labs and imaging studies  CBC: Recent Labs  Lab 09/20/18 0214 09/21/18 0234  WBC 11.7* 6.0  HGB 8.6* 8.5*  HCT 26.4* 25.7*  MCV 92.6 88.0  PLT 133* 856*   Basic Metabolic Panel:  Recent Labs  Lab 09/20/18 0214 09/21/18 0234  NA 138 135  K 4.0 3.3*  CL 108 104  CO2 11* 20*  GLUCOSE 131* 103*  BUN 116* 76*  CREATININE 22.28* 15.98*  CALCIUM 8.4* 8.2*   GFR: Estimated Creatinine Clearance: 8.5 mL/min (A) (by C-G formula based on SCr of 15.98 mg/dL (H)). Liver Function Tests: Recent Labs  Lab 09/20/18 0214  AST 15  ALT 15  ALKPHOS 91  BILITOT 0.5  PROT 7.7  ALBUMIN 3.3*   No results for input(s): LIPASE, AMYLASE in the last 168 hours. No results for input(s): AMMONIA in the last 168 hours. Coagulation Profile: No results for input(s): INR, PROTIME in the last 168 hours. Cardiac Enzymes: No results for input(s): CKTOTAL, CKMB, CKMBINDEX, TROPONINI in the last 168 hours. BNP (last 3 results) No results for input(s): PROBNP in the last 8760 hours. HbA1C: No results for input(s): HGBA1C in the last 72 hours. CBG: Recent Labs  Lab 09/20/18 0212  GLUCAP 109*   Lipid Profile: No results for input(s): CHOL, HDL, LDLCALC, TRIG, CHOLHDL, LDLDIRECT in the last 72 hours. Thyroid Function Tests: No results for input(s): TSH, T4TOTAL, FREET4, T3FREE, THYROIDAB in the last 72 hours. Anemia Panel: No results for input(s): VITAMINB12, FOLATE, FERRITIN, TIBC, IRON, RETICCTPCT in the last 72 hours. Urine analysis:    Component Value Date/Time   COLORURINE STRAW (A) 12/21/2017 0344   APPEARANCEUR CLEAR 12/21/2017 0344   LABSPEC 1.009 12/21/2017 0344   PHURINE 6.0 12/21/2017 0344   GLUCOSEU NEGATIVE 12/21/2017 0344   HGBUR SMALL (A) 12/21/2017 0344   BILIRUBINUR NEGATIVE 12/21/2017 0344   KETONESUR NEGATIVE 12/21/2017 0344   PROTEINUR 100 (A) 12/21/2017 0344   UROBILINOGEN 0.2 05/09/2013 1857   NITRITE NEGATIVE 12/21/2017 0344   LEUKOCYTESUR NEGATIVE 12/21/2017 0344   Sepsis Labs: @LABRCNTIP (procalcitonin:4,lacticidven:4)  )No results found for this or any previous visit (from the past 240 hour(s)).    Radiology Studies: Dg Chest Port 1 View   Result Date: 09/20/2018 CLINICAL DATA:  End-stage renal disease EXAM: PORTABLE CHEST 1 VIEW COMPARISON:  12/21/2017 FINDINGS: Cardiopericardial enlargement with vascular pedicle widening. Vascular congestion without Kerley lines or pleural effusion. No air bronchogram. Dialysis catheter with tip at the upper cavoatrial junction. IMPRESSION: Cardiomegaly and vascular congestion. Electronically Signed   By: Monte Fantasia M.D.   On: 09/20/2018 13:56   Dg Fluoro Guide Cv Line-no Report  Result Date: 09/20/2018 Fluoroscopy was utilized by the requesting physician.  No radiographic interpretation.     Scheduled Meds: . amLODipine  10 mg Oral Daily  . atorvastatin  40 mg Oral QPM  . calcitRIOL  0.25 mcg Oral Daily  . calcium acetate  667 mg Oral TID WC  . carvedilol  3.125 mg Oral BID WC  . Chlorhexidine Gluconate Cloth  6 each Topical Q0600  . lamoTRIgine  150 mg Oral BID   Continuous Infusions: . sodium chloride 10 mL/hr at 09/20/18 1004  . heparin 1,300 Units/hr (09/20/18 0701)     LOS: 1 day   Time Spent in minutes   30 minutes  Taneka Espiritu D.O.  on 09/21/2018 at 8:14 AM  Between 7am to 7pm - Please see pager noted on [email or web address]  After 7pm go to [email or web address]  And look for the night coverage person covering for me after hours  [facility] Office  [phone]

## 2018-09-22 ENCOUNTER — Other Ambulatory Visit: Payer: Self-pay

## 2018-09-22 DIAGNOSIS — N184 Chronic kidney disease, stage 4 (severe): Secondary | ICD-10-CM

## 2018-09-22 LAB — RENAL FUNCTION PANEL
Albumin: 2.6 g/dL — ABNORMAL LOW (ref 3.5–5.0)
Anion gap: 13 (ref 5–15)
BUN: 56 mg/dL — ABNORMAL HIGH (ref 6–20)
CO2: 22 mmol/L (ref 22–32)
Calcium: 8.4 mg/dL — ABNORMAL LOW (ref 8.9–10.3)
Chloride: 100 mmol/L (ref 98–111)
Creatinine, Ser: 13.81 mg/dL — ABNORMAL HIGH (ref 0.61–1.24)
GFR calc Af Amer: 5 mL/min — ABNORMAL LOW (ref >60)
GFR calc non Af Amer: 4 mL/min — ABNORMAL LOW (ref >60)
Glucose, Bld: 98 mg/dL (ref 70–99)
Phosphorus: 5.9 mg/dL — ABNORMAL HIGH (ref 2.5–4.6)
Potassium: 3.1 mmol/L — ABNORMAL LOW (ref 3.5–5.1)
Sodium: 135 mmol/L (ref 135–145)

## 2018-09-22 LAB — CBC
HCT: 25.6 % — ABNORMAL LOW (ref 39.0–52.0)
Hemoglobin: 8.2 g/dL — ABNORMAL LOW (ref 13.0–17.0)
MCH: 28.7 pg (ref 26.0–34.0)
MCHC: 32 g/dL (ref 30.0–36.0)
MCV: 89.5 fL (ref 80.0–100.0)
Platelets: UNDETERMINED 10*3/uL (ref 150–400)
RBC: 2.86 MIL/uL — ABNORMAL LOW (ref 4.22–5.81)
RDW: 13.8 % (ref 11.5–15.5)
WBC: 3.8 10*3/uL — ABNORMAL LOW (ref 4.0–10.5)
nRBC: 0 % (ref 0.0–0.2)

## 2018-09-22 MED ORDER — HEPARIN SODIUM (PORCINE) 1000 UNIT/ML DIALYSIS
1000.0000 [IU] | INTRAMUSCULAR | Status: DC | PRN
  Filled 2018-09-22: qty 1

## 2018-09-22 MED ORDER — LIDOCAINE-PRILOCAINE 2.5-2.5 % EX CREA
1.0000 "application " | TOPICAL_CREAM | CUTANEOUS | Status: DC | PRN
  Filled 2018-09-22: qty 5

## 2018-09-22 MED ORDER — ALTEPLASE 2 MG IJ SOLR
2.0000 mg | Freq: Once | INTRAMUSCULAR | Status: DC | PRN
  Filled 2018-09-22: qty 2

## 2018-09-22 MED ORDER — CHLORHEXIDINE GLUCONATE CLOTH 2 % EX PADS: 6.0000 | MEDICATED_PAD | Freq: Every day | CUTANEOUS | Status: DC

## 2018-09-22 MED ORDER — HEPARIN SODIUM (PORCINE) 1000 UNIT/ML IJ SOLN
INTRAMUSCULAR | Status: AC
  Filled 2018-09-22: qty 4

## 2018-09-22 MED ORDER — LIDOCAINE HCL (PF) 1 % IJ SOLN: 5.0000 mL | INTRAMUSCULAR | Status: DC | PRN

## 2018-09-22 MED ORDER — HEPARIN SODIUM (PORCINE) 1000 UNIT/ML IJ SOLN: 3.2000 mL | Freq: Once | INTRAMUSCULAR | Status: DC

## 2018-09-22 MED ORDER — SODIUM CHLORIDE 0.9 % IV SOLN: 100.0000 mL | INTRAVENOUS | Status: DC | PRN

## 2018-09-22 MED ORDER — HEPARIN SODIUM (PORCINE) 1000 UNIT/ML IJ SOLN
3.4000 mL | Freq: Once | INTRAMUSCULAR | Status: AC
  Administered 2018-09-22: 3400 [IU] via INTRAVENOUS

## 2018-09-22 MED ORDER — PENTAFLUOROPROP-TETRAFLUOROETH EX AERO: 1.0000 "application " | INHALATION_SPRAY | CUTANEOUS | Status: DC | PRN

## 2018-09-22 NOTE — Progress Notes (Signed)
  [facility] Progress Note   Assessment/ Plan:   1. CKD stage V - with known CKD prior creat 11, now w/ seizures and creat 22, has hx of seizures but presumably pt uremic and was started on HD yesterday 4/4.  HD #3 today. Will need CLIP to OP HD. TDC placed 4/4 by [facility], appreciate assistance. Has LUA AVF created 08/15/18.  2. HTN - cont meds. BP's up, UF 2-3 L today on HD.  3. H/o SLE  4. Antiphospholipid antibody syndrome - dx'd at time of 1st CVA in 2003; started on coumadin then; in 2005 noncompliant w/ meds and had 2nd CVA. Is now on eliquis. Getting IV heparin here. No further procedures anticipated, back on Eliquis 2.5 mg BID  5. Seizure d/o - on lamictal, ran out 2 wks ago 6. Anemia ckd - Hb 8.6, check fe/ tibc, start darbe 60 ug today   Subjective:    For HD #3 today.  Wants to leave "today".     Objective:   BP 123/76 (BP Location: Right Arm)   Pulse 80   Temp 99.2 F (37.3 C) (Oral)   Resp 18   Ht 6\' 1"  (1.854 m)   Wt 115.6 kg   SpO2 97%   BMI 33.62 kg/m   Physical Exam: Gen: NAD, lying in bed CVS: RRR Resp: clear Abd: soft nontender Ext: no LE edema ACCESS: R IJ TDC, L BVT (1st stage) + T/B  Labs: BMET Recent Labs  Lab 09/20/18 0214 09/21/18 0234 09/22/18 0549  NA 138 135 135  K 4.0 3.3* 3.1*  CL 108 104 100  CO2 11* 20* 22  GLUCOSE 131* 103* 98  BUN 116* 76* 56*  CREATININE 22.28* 15.98* 13.81*  CALCIUM 8.4* 8.2* 8.4*  PHOS  --   --  5.9*   CBC Recent Labs  Lab 09/20/18 0214 09/21/18 0234 09/22/18 0549  WBC 11.7* 6.0 3.8*  HGB 8.6* 8.5* 8.2*  HCT 26.4* 25.7* 25.6*  MCV 92.6 88.0 89.5  PLT 133* 125* PLATELET CLUMPS NOTED ON SMEAR, UNABLE TO ESTIMATE    @IMGRELPRIORS @ Medications:    . amLODipine  10 mg Oral Daily  . apixaban  2.5 mg Oral BID  . atorvastatin  40 mg Oral QPM  . calcitRIOL  0.25 mcg Oral Daily  . calcium acetate  667 mg Oral TID WC  . carvedilol  3.125 mg Oral BID WC  . Chlorhexidine Gluconate Cloth  6  each Topical Q0600  . Chlorhexidine Gluconate Cloth  6 each Topical Q0600  . [START ON 09/27/2018] darbepoetin (ARANESP) injection - DIALYSIS  60 mcg Intravenous Q Sat-HD  . lamoTRIgine  150 mg Oral BID     Madelon Lips, MD [facility] pgr [phone] 09/22/2018, 11:57 AM

## 2018-09-22 NOTE — Progress Notes (Signed)
PROGRESS NOTE    [name]  WFU:[id number] DOB: [birth year] DOA: 09/20/2018 PCP: Gildardo Pounds, NP   Brief Narrative:  On 09/20/2018 by Dr. Jennette Kettle [name] [name] is a 38 y.o. male with medical history significant of SLE, stroke, lupus anticoagulant on chronic eliquis, advanced CKD, presumably stage 5, just had initial dialysis fistula surgery in Feb.  Patient has been missing his Lamictal for past 1-2 weeks.  Today had a seizure, and brought in to ED.  Interim history Admitted for AKI/CKD, started HD. Pending CLIP Assessment & Plan   Acute kidney injury on chronic kidney disease, stage V -Patient does have fistula (placed in February 2020) however still not mature -Nephrology consulted and appreciated -Presented with a creatinine of 22.28, baseline appears to be approximately 10 -Vascular surgery consulted and appreciated, s/p R IJ vein cannulation under ultrasound guidance, tunneled dialysis catheter placement -Patient also had dialysis on 09/20/2018 -Creatinine currently 13.81 -Monitor intake and output -Continue to monitor BMP -Nephrology planning for HD today and CLIP  Seizure disorder -Secondary to missed seizure medications -Patient received Keppra at the recommendation of neurology -Continue Lamictal (will need refill on discharge) -Continue seizure precautions  Essential hypertension -Continue Norvasc, Coreg  Lupus anticoagulant -discontinued heparin drip -placed on eliquis  Anemia of chronic disease -hemoglobin appears to be stable, continue to monitor CBC  Headache -improved -Patient states he usually takes NSAIDs for his headache.  Throbbing in nature. -Continue Tylenol and tramadol as needed  DVT Prophylaxis  heparin  Code Status: Full  Family Communication: none at bedside  Disposition Plan: Admitted. Dispo home- pending nephro recommendations; CLIP process  Consultants Nephrology Neurology by [place] Vascular surgery  Procedures   1. Right internal jugular vein cannulation under ultrasound guidance 2. Right internal jugular vein tunneled dialysis catheter placement (23 cm Palindrome catheter)  Antibiotics   Anti-infectives (From admission, onward)   Start     Dose/Rate Route Frequency Ordered Stop   09/20/18 1000  ceFAZolin (ANCEF) 3 g in dextrose 5 % 50 mL IVPB     3 g 100 mL/hr over 30 Minutes Intravenous  Once 09/20/18 0948 09/20/18 1107   09/20/18 0949  ceFAZolin (ANCEF) 2-4 GM/100ML-% IVPB    Note to Pharmacy:  Henrine Screws   : cabinet override      09/20/18 0949 09/20/18 2159      Subjective:   [name] seen and examined today.  Patient has no complaints today. Wants to know when he can go home. Denies chest pain, shortness of breath, abdominal pain, N/V/D/C, dizziness, headache. Objective:   Vitals:   09/21/18 1948 09/21/18 2331 09/22/18 0350 09/22/18 0809  BP: 112/67 135/85 118/72 (!) 132/94  Pulse: 85 83 88 92  Resp: 17 18 17 18   Temp: 98.6 F (37 C) 98.7 F (37.1 C) 98.3 F (36.8 C) 99.9 F (37.7 C)  TempSrc: Oral Oral Oral Oral  SpO2: 97% 98% 94% 99%  Weight:      Height:        Intake/Output Summary (Last 24 hours) at 09/22/2018 0836 Last data filed at 09/21/2018 1800 Gross per 24 hour  Intake 475.9 ml  Output 2475 ml  Net -1999.1 ml   Filed Weights   09/21/18 0824 09/21/18 0900 09/21/18 1210  Weight: 118.3 kg 118.3 kg 115.6 kg   Exam  General: Well developed, well nourished, NAD, appears stated age  HEENT: NCAT,  mucous membranes moist.   Cardiovascular: S1 S2 auscultated, RRR, no murmur   Respiratory:  Clear to auscultation bilaterally with equal chest rise  Abdomen: Soft, nontender, nondistended, + bowel sounds  Extremities: warm dry without cyanosis clubbing or edema of LE. Mild RUE edema.  Neuro: AAOx3, nonfocal  Psych: Appropriate mood and affect  Data Reviewed: I have personally reviewed following labs and imaging studies  CBC: Recent Labs  Lab 09/20/18  0214 09/21/18 0234 09/22/18 0549  WBC 11.7* 6.0 3.8*  HGB 8.6* 8.5* 8.2*  HCT 26.4* 25.7* 25.6*  MCV 92.6 88.0 89.5  PLT 133* 125* PLATELET CLUMPS NOTED ON SMEAR, UNABLE TO ESTIMATE   Basic Metabolic Panel: Recent Labs  Lab 09/20/18 0214 09/21/18 0234 09/22/18 0549  NA 138 135 135  K 4.0 3.3* 3.1*  CL 108 104 100  CO2 11* 20* 22  GLUCOSE 131* 103* 98  BUN 116* 76* 56*  CREATININE 22.28* 15.98* 13.81*  CALCIUM 8.4* 8.2* 8.4*  PHOS  --   --  5.9*   GFR: Estimated Creatinine Clearance: 9.6 mL/min (A) (by C-G formula based on SCr of 13.81 mg/dL (H)). Liver Function Tests: Recent Labs  Lab 09/20/18 0214 09/22/18 0549  AST 15  --   ALT 15  --   ALKPHOS 91  --   BILITOT 0.5  --   PROT 7.7  --   ALBUMIN 3.3* 2.6*   No results for input(s): LIPASE, AMYLASE in the last 168 hours. No results for input(s): AMMONIA in the last 168 hours. Coagulation Profile: No results for input(s): INR, PROTIME in the last 168 hours. Cardiac Enzymes: No results for input(s): CKTOTAL, CKMB, CKMBINDEX, TROPONINI in the last 168 hours. BNP (last 3 results) No results for input(s): PROBNP in the last 8760 hours. HbA1C: No results for input(s): HGBA1C in the last 72 hours. CBG: Recent Labs  Lab 09/20/18 0212  GLUCAP 109*   Lipid Profile: No results for input(s): CHOL, HDL, LDLCALC, TRIG, CHOLHDL, LDLDIRECT in the last 72 hours. Thyroid Function Tests: No results for input(s): TSH, T4TOTAL, FREET4, T3FREE, THYROIDAB in the last 72 hours. Anemia Panel: Recent Labs    09/21/18 1425  TIBC 196*  IRON 70   Urine analysis:    Component Value Date/Time   COLORURINE STRAW (A) 12/21/2017 0344   APPEARANCEUR CLEAR 12/21/2017 0344   LABSPEC 1.009 12/21/2017 0344   PHURINE 6.0 12/21/2017 0344   GLUCOSEU NEGATIVE 12/21/2017 0344   HGBUR SMALL (A) 12/21/2017 0344   BILIRUBINUR NEGATIVE 12/21/2017 0344   KETONESUR NEGATIVE 12/21/2017 0344   PROTEINUR 100 (A) 12/21/2017 0344    UROBILINOGEN 0.2 05/09/2013 1857   NITRITE NEGATIVE 12/21/2017 0344   LEUKOCYTESUR NEGATIVE 12/21/2017 0344   Sepsis Labs: @LABRCNTIP (procalcitonin:4,lacticidven:4)  )No results found for this or any previous visit (from the past 240 hour(s)).    Radiology Studies: Dg Chest Port 1 View  Result Date: 09/20/2018 CLINICAL DATA:  End-stage renal disease EXAM: PORTABLE CHEST 1 VIEW COMPARISON:  12/21/2017 FINDINGS: Cardiopericardial enlargement with vascular pedicle widening. Vascular congestion without Kerley lines or pleural effusion. No air bronchogram. Dialysis catheter with tip at the upper cavoatrial junction. IMPRESSION: Cardiomegaly and vascular congestion. Electronically Signed   By: Monte Fantasia M.D.   On: 09/20/2018 13:56   Dg Fluoro Guide Cv Line-no Report  Result Date: 09/20/2018 Fluoroscopy was utilized by the requesting physician.  No radiographic interpretation.     Scheduled Meds: . amLODipine  10 mg Oral Daily  . apixaban  2.5 mg Oral BID  . atorvastatin  40 mg Oral QPM  . calcitRIOL  0.25 mcg  Oral Daily  . calcium acetate  667 mg Oral TID WC  . carvedilol  3.125 mg Oral BID WC  . Chlorhexidine Gluconate Cloth  6 each Topical Q0600  . [START ON 09/27/2018] darbepoetin (ARANESP) injection - DIALYSIS  60 mcg Intravenous Q Sat-HD  . lamoTRIgine  150 mg Oral BID   Continuous Infusions: . sodium chloride 10 mL/hr at 09/20/18 1004     LOS: 2 days   Time Spent in minutes   30 minutes  Toney Difatta D.O. on 09/22/2018 at 8:36 AM  Between 7am to 7pm - Please see pager noted on [email or web address]  After 7pm go to [email or web address]  And look for the night coverage person covering for me after hours  [facility] Office  [phone]

## 2018-09-22 NOTE — Progress Notes (Signed)
Dialysis Coordinator has spoken with patient by phone to complete assessment. Referral process initiated for OP HD seat at [facility]. DC was very specific to note that patient needs a first shift seat to accommodate his work schedule of 1pm-close. If [facility] cannot accommodate this schedule, patient has transportation to drive to a clinic father from his home. Patient states no preference as far as day of the week. DC understands that he is currently on MWF schedule in the hospital. DC will continue to follow until seat is secured and patient is discharged.   Alphonzo Cruise Dialysis Coordinator [phone]

## 2018-09-22 NOTE — TOC Initial Note (Signed)
Transition of Care [facility]) - Initial/Assessment Note    Patient Details  Name: [name] [name] MRN: [id number] Date of Birth: October 11, 1980  Transition of Care [facility]) CM/SW Contact:    Pollie Friar, RN Phone Number: 09/22/2018, 12:18 PM  Clinical Narrative:                   Expected Discharge Plan: Home/Self Care Barriers to Discharge: Waiting for outpatient dialysis, Continued Medical Work up   Patient Goals and CMS Choice        Expected Discharge Plan and Services Expected Discharge Plan: Home/Self Care       Living arrangements for the past 2 months: Single Family Home(one level with 3 step entry)                          Prior Living Arrangements/Services Living arrangements for the past 2 months: Single Family Home(one level with 3 step entry) Lives with:: Friends Patient language and need for interpreter reviewed:: Yes(no needs)        Need for Family Participation in Patient Care: No (Comment) Care giver support system in place?: No (comment)   Criminal Activity/Legal Involvement Pertinent to Current Situation/Hospitalization: No - Comment as needed  Activities of Daily Living      Permission Sought/Granted                  Emotional Assessment   Attitude/Demeanor/Rapport: Engaged Affect (typically observed): Flat, Pleasant Orientation: : Oriented to Self, Oriented to Place, Oriented to  Time, Oriented to Situation   Psych Involvement: No (comment)  Admission diagnosis:  Seizure ([place]) [R56.9] Increased anion gap metabolic acidosis [E72.0] Laceration of tongue, initial encounter [S01.512A] Acute renal failure superimposed on stage 5 chronic kidney disease, not on chronic dialysis, unspecified acute renal failure type ([place]) [N17.9, N18.5] Patient Active Problem List   Diagnosis Date Noted  . Acute renal failure superimposed on chronic kidney disease ([place]) 09/20/2018  . ESRD (end stage renal disease) ([place]) 03/18/2018  . Prediabetes 01/10/2018   . ARF (acute renal failure) ([place]) 12/21/2017  . Chest pain in adult   . CKD (chronic kidney disease) stage 3, GFR 30-59 ml/min ([facility]) 10/24/2015  . Seizures ([place]) 10/24/2015  . HTN (hypertension) 01/12/2015  . Chronic anticoagulation 04/24/2011  . Lupus anticoagulant positive 04/24/2011  . OBESITY, NOS 08/15/2006  . THROMBOCYTOPENIA 08/15/2006  . TOBACCO DEPENDENCE 08/15/2006  . CVA 08/15/2006   PCP:  Gildardo Pounds, NP Pharmacy:   [facility] [address] [place] [address] [place] [address] Phone: [phone] Fax: [phone]     Social Determinants of Health (SDOH) Interventions  Pt drives and is able to get to outpatient HD visits.  Pt denies issues with taking or obtaining home meds.   Readmission Risk Interventions No flowsheet data found.

## 2018-09-22 NOTE — Progress Notes (Signed)
Patient refused bed alarm.    

## 2018-09-23 DIAGNOSIS — D539 Nutritional anemia, unspecified: Secondary | ICD-10-CM | POA: Insufficient documentation

## 2018-09-23 DIAGNOSIS — D631 Anemia in chronic kidney disease: Secondary | ICD-10-CM | POA: Insufficient documentation

## 2018-09-23 DIAGNOSIS — D649 Anemia, unspecified: Secondary | ICD-10-CM | POA: Insufficient documentation

## 2018-09-23 LAB — CBC
HCT: 26 % — ABNORMAL LOW (ref 39.0–52.0)
Hemoglobin: 8.8 g/dL — ABNORMAL LOW (ref 13.0–17.0)
MCH: 30.4 pg (ref 26.0–34.0)
MCHC: 33.8 g/dL (ref 30.0–36.0)
MCV: 90 fL (ref 80.0–100.0)
Platelets: 88 10*3/uL — ABNORMAL LOW (ref 150–400)
RBC: 2.89 MIL/uL — ABNORMAL LOW (ref 4.22–5.81)
RDW: 13.7 % (ref 11.5–15.5)
WBC: 4.3 10*3/uL (ref 4.0–10.5)
nRBC: 0 % (ref 0.0–0.2)

## 2018-09-23 LAB — RENAL FUNCTION PANEL
Albumin: 2.6 g/dL — ABNORMAL LOW (ref 3.5–5.0)
Albumin: 2.7 g/dL — ABNORMAL LOW (ref 3.5–5.0)
Anion gap: 13 (ref 5–15)
Anion gap: 9 (ref 5–15)
BUN: 31 mg/dL — ABNORMAL HIGH (ref 6–20)
BUN: 34 mg/dL — ABNORMAL HIGH (ref 6–20)
CO2: 23 mmol/L (ref 22–32)
CO2: 27 mmol/L (ref 22–32)
Calcium: 8.7 mg/dL — ABNORMAL LOW (ref 8.9–10.3)
Calcium: 9 mg/dL (ref 8.9–10.3)
Chloride: 99 mmol/L (ref 98–111)
Chloride: 100 mmol/L (ref 98–111)
Creatinine, Ser: 10.5 mg/dL — ABNORMAL HIGH (ref 0.61–1.24)
Creatinine, Ser: 11.83 mg/dL — ABNORMAL HIGH (ref 0.61–1.24)
GFR calc Af Amer: 6 mL/min — ABNORMAL LOW (ref >60)
GFR calc Af Amer: 6 mL/min — ABNORMAL LOW (ref >60)
GFR calc non Af Amer: 5 mL/min — ABNORMAL LOW (ref >60)
GFR calc non Af Amer: 5 mL/min — ABNORMAL LOW (ref >60)
Glucose, Bld: 93 mg/dL (ref 70–99)
Glucose, Bld: 105 mg/dL — ABNORMAL HIGH (ref 70–99)
Phosphorus: 4.6 mg/dL (ref 2.5–4.6)
Phosphorus: 4.6 mg/dL (ref 2.5–4.6)
Potassium: 3.3 mmol/L — ABNORMAL LOW (ref 3.5–5.1)
Potassium: 3.3 mmol/L — ABNORMAL LOW (ref 3.5–5.1)
Sodium: 135 mmol/L (ref 135–145)
Sodium: 136 mmol/L (ref 135–145)

## 2018-09-23 MED ORDER — LAMOTRIGINE 150 MG PO TABS: 150.0000 mg | ORAL_TABLET | Freq: Two times a day (BID) | ORAL | 0 refills | Status: DC

## 2018-09-23 MED ORDER — CALCIUM ACETATE (PHOS BINDER) 667 MG PO CAPS: 667.0000 mg | ORAL_CAPSULE | ORAL | Status: DC

## 2018-09-23 NOTE — Progress Notes (Signed)
Patient discharging to home. AVS reviewed with patient, patient verbalized understanding. Right AC IV removed, patient tolerated well. IJ in place dressing clean, dry and intact. Patient will be picked up by family friend. Patient did not have any questions at this time.

## 2018-09-23 NOTE — Plan of Care (Signed)
Patient verbalized understanding the importance of taking all medications as prescribed.Patient stated he will take all medications today.

## 2018-09-23 NOTE — Discharge Instructions (Signed)

## 2018-09-23 NOTE — Progress Notes (Signed)
Patient has OP HD seat secured for MWF at 7:40am at [facility]. He must go to the clinic today at discharge before 5pm in order to sign paperwork in order to start his first treatment tomorrow morning, September 24, 2018. Dialysis Coordinator has communicated this with patient and he has stated understanding. Dialysis Coordinator also informed Nephrologist.   [place]  Alphonzo Cruise Dialysis Coordinator [phone]

## 2018-09-23 NOTE — Progress Notes (Signed)
PROGRESS NOTE    [name] [name]  VOZ:[id number] DOB: 1980/12/03 DOA: 09/20/2018 PCP: Gildardo Pounds, NP   Brief Narrative:  On 09/20/2018 by Dr. Jennette Kettle [name] [name] is a 38 y.o. male with medical history significant of SLE, stroke, lupus anticoagulant on chronic eliquis, advanced CKD, presumably stage 5, just had initial dialysis fistula surgery in Feb.  Patient has been missing his Lamictal for past 1-2 weeks.  Today had a seizure, and brought in to ED.  Interim history Admitted for AKI/CKD, started HD. Pending CLIP Assessment & Plan   Acute kidney injury on chronic kidney disease, stage V -Patient does have fistula (placed in February 2020) however still not mature -Nephrology consulted and appreciated -Presented with a creatinine of 22.28, baseline appears to be approximately 10 -Vascular surgery consulted and appreciated, s/p R IJ vein cannulation under ultrasound guidance, tunneled dialysis catheter placement -Patient also had dialysis on 09/20/2018 -Creatinine currently 13.81 -Monitor intake and output -Continue to monitor BMP -Pending CLIP  Seizure disorder -Secondary to missed seizure medications -Patient received Keppra at the recommendation of neurology -Continue Lamictal (will need refill on discharge) -Continue seizure precautions  Essential hypertension -Continue Norvasc, Coreg  Lupus anticoagulant -discontinued heparin drip -Continue eliquis  Anemia of chronic disease -hemoglobin appears to be stable, continue to monitor CBC  Headache -improved -Patient states he usually takes NSAIDs for his headache.  Throbbing in nature. -Continue Tylenol and tramadol as needed  Hypokalemia -replace with HD -monitor BMP  DVT Prophylaxis  Eliquis  Code Status: Full  Family Communication: none at bedside  Disposition Plan: Admitted. Dispo home- pending nephro recommendations; CLIP process  Consultants Nephrology Neurology by [facility]   Procedures  1. Right internal jugular vein cannulation under ultrasound guidance 2. Right internal jugular vein tunneled dialysis catheter placement (23 cm Palindrome catheter)  Antibiotics   Anti-infectives (From admission, onward)   Start     Dose/Rate Route Frequency Ordered Stop   09/20/18 1000  ceFAZolin (ANCEF) 3 g in dextrose 5 % 50 mL IVPB     3 g 100 mL/hr over 30 Minutes Intravenous  Once 09/20/18 0948 09/20/18 1107   09/20/18 0949  ceFAZolin (ANCEF) 2-4 GM/100ML-% IVPB    Note to Pharmacy:  Henrine Screws   : cabinet override      09/20/18 0949 09/20/18 2159      Subjective:   [name] [name] seen and examined today.  Patient with no complaints today.  Denies current chest pain, shortness of breath, abdominal pain, nausea vomiting, diarrhea constipation, dizziness or headache.  Inquires about how long the temporary dialysis catheter will stay in place. Objective:   Vitals:   09/22/18 2009 09/22/18 2330 09/23/18 0402 09/23/18 0746  BP: (!) 149/85 131/86 129/83 120/74  Pulse: 85 84 89 77  Resp: 17 17 15 18   Temp: 98.2 F (36.8 C)  100 F (37.8 C) 98.6 F (37 C)  TempSrc: Oral  Oral Oral  SpO2: 97% 98% 95% 97%  Weight:      Height:        Intake/Output Summary (Last 24 hours) at 09/23/2018 1032 Last data filed at 09/22/2018 1602 Gross per 24 hour  Intake -  Output 1500 ml  Net -1500 ml   Filed Weights   09/21/18 1210 09/22/18 1230 09/22/18 1602  Weight: 115.6 kg 116.1 kg 114.6 kg    Exam  General: Well developed, well nourished, NAD, appears stated age  HEENT: NCAT, mucous membranes moist.   Cardiovascular: S1  S2 auscultated, no murmur, RRR  Respiratory: Clear to auscultation bilaterally with equal chest rise  Abdomen: Soft, nontender, nondistended, + bowel sounds  Extremities: warm dry without cyanosis clubbing or edema  Neuro: AAOx3, nonfocal  Psych: Appropriate mood and affect  Data Reviewed: I have personally reviewed following labs and imaging  studies  CBC: Recent Labs  Lab 09/20/18 0214 09/21/18 0234 09/22/18 0549 09/23/18 0334  WBC 11.7* 6.0 3.8* 4.3  HGB 8.6* 8.5* 8.2* 8.8*  HCT 26.4* 25.7* 25.6* 26.0*  MCV 92.6 88.0 89.5 90.0  PLT 133* 125* PLATELET CLUMPS NOTED ON SMEAR, UNABLE TO ESTIMATE 88*   Basic Metabolic Panel: Recent Labs  Lab 09/20/18 0214 09/21/18 0234 09/22/18 0549 09/23/18 0334  NA 138 135 135 135  K 4.0 3.3* 3.1* 3.3*  CL 108 104 100 99  CO2 11* 20* 22 23  GLUCOSE 131* 103* 98 93  BUN 116* 76* 56* 31*  CREATININE 22.28* 15.98* 13.81* 10.50*  CALCIUM 8.4* 8.2* 8.4* 8.7*  PHOS  --   --  5.9* 4.6   GFR: Estimated Creatinine Clearance: 12.5 mL/min (A) (by C-G formula based on SCr of 10.5 mg/dL (H)). Liver Function Tests: Recent Labs  Lab 09/20/18 0214 09/22/18 0549 09/23/18 0334  AST 15  --   --   ALT 15  --   --   ALKPHOS 91  --   --   BILITOT 0.5  --   --   PROT 7.7  --   --   ALBUMIN 3.3* 2.6* 2.6*   No results for input(s): LIPASE, AMYLASE in the last 168 hours. No results for input(s): AMMONIA in the last 168 hours. Coagulation Profile: No results for input(s): INR, PROTIME in the last 168 hours. Cardiac Enzymes: No results for input(s): CKTOTAL, CKMB, CKMBINDEX, TROPONINI in the last 168 hours. BNP (last 3 results) No results for input(s): PROBNP in the last 8760 hours. HbA1C: No results for input(s): HGBA1C in the last 72 hours. CBG: Recent Labs  Lab 09/20/18 0212  GLUCAP 109*   Lipid Profile: No results for input(s): CHOL, HDL, LDLCALC, TRIG, CHOLHDL, LDLDIRECT in the last 72 hours. Thyroid Function Tests: No results for input(s): TSH, T4TOTAL, FREET4, T3FREE, THYROIDAB in the last 72 hours. Anemia Panel: Recent Labs    09/21/18 1425  TIBC 196*  IRON 70   Urine analysis:    Component Value Date/Time   COLORURINE STRAW (A) 12/21/2017 0344   APPEARANCEUR CLEAR 12/21/2017 0344   LABSPEC 1.009 12/21/2017 0344   PHURINE 6.0 12/21/2017 0344   GLUCOSEU NEGATIVE  12/21/2017 0344   HGBUR SMALL (A) 12/21/2017 0344   BILIRUBINUR NEGATIVE 12/21/2017 0344   KETONESUR NEGATIVE 12/21/2017 0344   PROTEINUR 100 (A) 12/21/2017 0344   UROBILINOGEN 0.2 05/09/2013 1857   NITRITE NEGATIVE 12/21/2017 0344   LEUKOCYTESUR NEGATIVE 12/21/2017 0344   Sepsis Labs: @LABRCNTIP (procalcitonin:4,lacticidven:4)  )No results found for this or any previous visit (from the past 240 hour(s)).    Radiology Studies: No results found.   Scheduled Meds: . amLODipine  10 mg Oral Daily  . apixaban  2.5 mg Oral BID  . atorvastatin  40 mg Oral QPM  . calcitRIOL  0.25 mcg Oral Daily  . calcium acetate  667 mg Oral TID WC  . carvedilol  3.125 mg Oral BID WC  . Chlorhexidine Gluconate Cloth  6 each Topical Q0600  . Chlorhexidine Gluconate Cloth  6 each Topical Q0600  . [START ON 09/27/2018] darbepoetin (ARANESP) injection - DIALYSIS  60  mcg Intravenous Q Sat-HD  . lamoTRIgine  150 mg Oral BID   Continuous Infusions: . sodium chloride 10 mL/hr at 09/20/18 1004  . sodium chloride    . sodium chloride       LOS: 3 days   Time Spent in minutes   30 minutes  Constantin Hillery D.O. on 09/23/2018 at 10:32 AM  Between 7am to 7pm - Please see pager noted on [email or web address]  After 7pm go to [email or web address]  And look for the night coverage person covering for me after hours  [facility] Office  [phone]

## 2018-09-23 NOTE — TOC Transition Note (Signed)
Transition of Care [facility]) - CM/SW Discharge Note   Patient Details  Name: [name] [name] MRN: [id number] Date of Birth: 1981-01-12  [facility] CM/SW Contact:  Pollie Friar, RN Phone Number: 09/23/2018, 3:20 PM   Clinical Narrative:    Pt discharging home. Pt has transportation. Pt to go to his new dialysis center today by 5 pm to do paperwork. Bedside RN aware of timeframe.    Final next level of care: Home/Self Care Barriers to Discharge: No Barriers Identified   Patient Goals and CMS Choice        Discharge Placement                       Discharge Plan and Services                          Social Determinants of Health (SDOH) Interventions     Readmission Risk Interventions No flowsheet data found.

## 2018-09-23 NOTE — Discharge Summary (Signed)
Physician Discharge Summary  [name] [name] [id number] DOB: September 14, 1980 DOA: 09/20/2018  PCP: Gildardo Pounds, NP  Admit date: 09/20/2018 Discharge date: 09/23/2018  Time spent: 45 minutes  Recommendations for Outpatient Follow-up:  Patient will be discharged to home. Follow up with dialysis unit today by 5pm to complete paperwork.  Patient will need to follow up with primary care provider within one week of discharge.  Continue hemodialysis as scheduled.  Patient should continue medications as prescribed.  Patient should follow a renal healthy diet.    Discharge Diagnoses:  Acute kidney injury on chronic kidney disease, stage V Seizure disorder Essential hypertension Lupus anticoagulant Anemia of chronic disease Headache Hypokalemia  Discharge Condition: Stable  Diet recommendation: renal healthy  Filed Weights   09/21/18 1210 09/22/18 1230 09/22/18 1602  Weight: 115.6 kg 116.1 kg 114.6 kg    History of present illness:  On 09/20/2018 by Dr. Vertell Limber Branchis a 38 y.o.malewith medical history significant ofSLE, stroke, lupus anticoagulant on chronic eliquis, advanced CKD, presumably stage 5, just had initial dialysis fistula surgery in Feb.  Patient has been missing his Lamictal for past 1-2 weeks.  Today had a seizure, and brought in to ED.  Hospital Course:  Acute kidney injury on chronic kidney disease, stage V -Patient does have fistula (placed in February 2020) however still not mature -Nephrology consulted and appreciated -Presented with a creatinine of 22.28, baseline appears to be approximately 10 -Vascular surgery consulted and appreciated, s/p R IJ vein cannulation under ultrasound guidance, tunneled dialysis catheter placement -Patient also had dialysis on 09/20/2018 -Creatinine currently 13.81 -Monitor intake and output -Continue to monitor BMP -patient has been clipped, has HD slot [facility] MWF 7:40 am, needs to sign paperwork today by 5pm in  order to start HD on 09/24/2018  Seizure disorder -Secondary to missed seizure medications -Patient received Keppra at the recommendation of neurology -Continue Lamictal (will need refill on discharge) -Continue seizure precautions  Essential hypertension -Continue Norvasc, Coreg  Lupus anticoagulant -discontinued heparin drip -Continue eliquis  Anemia of chronic disease -hemoglobin appears to be stable, continue to monitor CBC  Headache -improved -Patient states he usually takes NSAIDs for his headache.  Throbbing in nature. -Continue Tylenol and tramadol as needed  Hypokalemia -replace with HD  Consultants Nephrology Neurology by [place] [facility]  Procedures  1. Rightinternal jugular veincannulation under ultrasound guidance 2. Rightinternal jugular veintunneled dialysis catheter placement (23 cm Palindrome catheter)   Discharge Exam: Vitals:   09/23/18 0746 09/23/18 1215  BP: 120/74 119/73  Pulse: 77 78  Resp: 18 16  Temp: 98.6 F (37 C) 98.4 F (36.9 C)  SpO2: 97% 97%    Exam  General: Well developed, well nourished, NAD, appears stated age  38: NCAT, mucous membranes moist.   Cardiovascular: S1 S2 auscultated, no murmur, RRR  Respiratory: Clear to auscultation bilaterally with equal chest rise  Abdomen: Soft, nontender, nondistended, + bowel sounds  Extremities: warm dry without cyanosis clubbing or edema  Neuro: AAOx3, nonfocal  Psych: Appropriate mood and affect  Discharge Instructions Discharge Instructions    Discharge instructions   Complete by:  As directed    Patient will be discharged to home. Follow up with dialysis unit today by 5pm to complete paperwork.  Patient will need to follow up with primary care provider within one week of discharge.  Continue hemodialysis as scheduled.  Patient should continue medications as prescribed.  Patient should follow a renal healthy diet.     Allergies  as of 09/23/2018       Reactions   Zyrtec [cetirizine] Swelling   Facial swelling      Medication List    STOP taking these medications   hydrochlorothiazide 25 MG tablet Commonly known as:  HYDRODIURIL   LamoTRIgine 300 MG Tb24 24 hour tablet Commonly known as:  LaMICtal XR Replaced by:  lamoTRIgine 150 MG tablet     TAKE these medications   amLODipine 10 MG tablet Commonly known as:  NORVASC Take 1 tablet (10 mg total) by mouth daily.   apixaban 2.5 MG Tabs tablet Commonly known as:  Eliquis Take 1 tablet (2.5 mg total) by mouth 2 (two) times daily.   atorvastatin 40 MG tablet Commonly known as:  LIPITOR Take 1 tablet (40 mg total) by mouth daily. What changed:  when to take this   calcitRIOL 0.25 MCG capsule Commonly known as:  ROCALTROL Take 1 capsule (0.25 mcg total) by mouth daily.   calcium acetate 667 MG capsule Commonly known as:  PHOSLO Take 1 capsule (667 mg total) by mouth See admin instructions. Take 667 mg with each meal and each snack   carvedilol 3.125 MG tablet Commonly known as:  COREG Take 1 tablet (3.125 mg total) by mouth 2 (two) times daily with a meal.   lamoTRIgine 150 MG tablet Commonly known as:  LAMICTAL Take 1 tablet (150 mg total) by mouth 2 (two) times daily. Replaces:  LamoTRIgine 300 MG Tb24 24 hour tablet      Allergies  Allergen Reactions  . Zyrtec [Cetirizine] Swelling    Facial swelling   Follow-up Information    Gildardo Pounds, NP. Schedule an appointment as soon as possible for a visit in 1 week(s).   Specialty:  Nurse Practitioner Why:  Hospital follow up Contact information: [place] [address] [phone]            The results of significant diagnostics from this hospitalization (including imaging, microbiology, ancillary and laboratory) are listed below for reference.    Significant Diagnostic Studies: Dg Chest Port 1 View  Result Date: 09/20/2018 CLINICAL DATA:  End-stage renal disease EXAM: PORTABLE  CHEST 1 VIEW COMPARISON:  12/21/2017 FINDINGS: Cardiopericardial enlargement with vascular pedicle widening. Vascular congestion without Kerley lines or pleural effusion. No air bronchogram. Dialysis catheter with tip at the upper cavoatrial junction. IMPRESSION: Cardiomegaly and vascular congestion. Electronically Signed   By: Monte Fantasia M.D.   On: 09/20/2018 13:56   Dg Fluoro Guide Cv Line-no Report  Result Date: 09/20/2018 Fluoroscopy was utilized by the requesting physician.  No radiographic interpretation.    Microbiology: No results found for this or any previous visit (from the past 240 hour(s)).   Labs: Basic Metabolic Panel: Recent Labs  Lab 09/20/18 0214 09/21/18 0234 09/22/18 0549 09/23/18 0334 09/23/18 1118  NA 138 135 135 135 136  K 4.0 3.3* 3.1* 3.3* 3.3*  CL 108 104 100 99 100  CO2 11* 20* 22 23 27   GLUCOSE 131* 103* 98 93 105*  BUN 116* 76* 56* 31* 34*  CREATININE 22.28* 15.98* 13.81* 10.50* 11.83*  CALCIUM 8.4* 8.2* 8.4* 8.7* 9.0  PHOS  --   --  5.9* 4.6 4.6   Liver Function Tests: Recent Labs  Lab 09/20/18 0214 09/22/18 0549 09/23/18 0334 09/23/18 1118  AST 15  --   --   --   ALT 15  --   --   --   ALKPHOS 91  --   --   --  BILITOT 0.5  --   --   --   PROT 7.7  --   --   --   ALBUMIN 3.3* 2.6* 2.6* 2.7*   No results for input(s): LIPASE, AMYLASE in the last 168 hours. No results for input(s): AMMONIA in the last 168 hours. CBC: Recent Labs  Lab 09/20/18 0214 09/21/18 0234 09/22/18 0549 09/23/18 0334  WBC 11.7* 6.0 3.8* 4.3  HGB 8.6* 8.5* 8.2* 8.8*  HCT 26.4* 25.7* 25.6* 26.0*  MCV 92.6 88.0 89.5 90.0  PLT 133* 125* PLATELET CLUMPS NOTED ON SMEAR, UNABLE TO ESTIMATE 88*   Cardiac Enzymes: No results for input(s): CKTOTAL, CKMB, CKMBINDEX, TROPONINI in the last 168 hours. BNP: BNP (last 3 results) No results for input(s): BNP in the last 8760 hours.  ProBNP (last 3 results) No results for input(s): PROBNP in the last 8760 hours.   CBG: Recent Labs  Lab 09/20/18 0212  GLUCAP 109*       Signed:  Pierce Barocio  [facility] 09/23/2018, 2:31 PM

## 2018-09-23 NOTE — Progress Notes (Signed)
  [facility] Progress Note   Assessment/ Plan:   1. CKD stage V-->ESRD - with known CKD prior creat 11, now w/ seizures and creat 22, has hx of seizures but presumably pt uremic and was started on HD  4/4.  HD #3 4/6. CLIP'd to [facility] TDC placed 4/4 by [facility]VS, appreciate assistance. Has LUA AVF created 08/15/18. No heparin in HD.  2. HTN - cont meds. BP's up, UF 2-3 L today on HD.  3. H/o SLE  4. Antiphospholipid antibody syndrome - dx'd at time of 1st CVA in 2003; started on coumadin then; in 2005 noncompliant w/ meds and had 2nd CVA. Is now on eliquis 2.5 mg BID 5. Seizure d/o - on lamictal, ran out 2 wks ago 6. BMMD: calcitriol 0.25 mcg daily and phoslo 1 TID AC 7. Anemia ckd - Hb 8.6, check fe/ tibc, aranesp 60 mcg given 4/4 8. Dispo: OK from renal perspective to go today   Subjective:    No changes.  Has a CLIP spot to start at [facility] tomorrow.     Objective:   BP 119/73 (BP Location: Right Arm)   Pulse 78   Temp 98.4 F (36.9 C) (Oral)   Resp 16   Ht 6\' 1"  (1.854 m)   Wt 114.6 kg   SpO2 97%   BMI 33.33 kg/m   Physical Exam: Gen: NAD, lying in bed CVS: RRR Resp: clear Abd: soft nontender Ext: no LE edema ACCESS: R IJ TDC, L BVT (1st stage) + T/B  Labs: BMET Recent Labs  Lab 09/20/18 0214 09/21/18 0234 09/22/18 0549 09/23/18 0334 09/23/18 1118  NA 138 135 135 135 136  K 4.0 3.3* 3.1* 3.3* 3.3*  CL 108 104 100 99 100  CO2 11* 20* 22 23 27   GLUCOSE 131* 103* 98 93 105*  BUN 116* 76* 56* 31* 34*  CREATININE 22.28* 15.98* 13.81* 10.50* 11.83*  CALCIUM 8.4* 8.2* 8.4* 8.7* 9.0  PHOS  --   --  5.9* 4.6 4.6   CBC Recent Labs  Lab 09/20/18 0214 09/21/18 0234 09/22/18 0549 09/23/18 0334  WBC 11.7* 6.0 3.8* 4.3  HGB 8.6* 8.5* 8.2* 8.8*  HCT 26.4* 25.7* 25.6* 26.0*  MCV 92.6 88.0 89.5 90.0  PLT 133* 125* PLATELET CLUMPS NOTED ON SMEAR, UNABLE TO ESTIMATE 88*    @IMGRELPRIORS @ Medications:    . amLODipine  10 mg Oral Daily  . apixaban  2.5  mg Oral BID  . atorvastatin  40 mg Oral QPM  . calcitRIOL  0.25 mcg Oral Daily  . calcium acetate  667 mg Oral TID WC  . carvedilol  3.125 mg Oral BID WC  . Chlorhexidine Gluconate Cloth  6 each Topical Q0600  . Chlorhexidine Gluconate Cloth  6 each Topical Q0600  . [START ON 09/27/2018] darbepoetin (ARANESP) injection - DIALYSIS  60 mcg Intravenous Q Sat-HD  . lamoTRIgine  150 mg Oral BID     Madelon Lips, MD Bernie pgr [phone] 09/23/2018, 2:25 PM

## 2018-09-24 DIAGNOSIS — T8249XA Other complication of vascular dialysis catheter, initial encounter: Secondary | ICD-10-CM | POA: Diagnosis not present

## 2018-09-24 DIAGNOSIS — N186 End stage renal disease: Secondary | ICD-10-CM | POA: Diagnosis not present

## 2018-09-25 ENCOUNTER — Other Ambulatory Visit: Payer: Self-pay

## 2018-09-26 ENCOUNTER — Ambulatory Visit (HOSPITAL_COMMUNITY)
Admission: RE | Admit: 2018-09-26 | Discharge: 2018-09-26 | Disposition: A | Discharge status: Home / Self Care | Payer: BLUE CROSS/BLUE SHIELD | Source: Ambulatory Visit | Attending: Nephrology | Admitting: Nephrology

## 2018-09-26 VITALS — BP 149/86 | HR 94 | Temp 97.1°F | Resp 18

## 2018-09-26 DIAGNOSIS — N186 End stage renal disease: Secondary | ICD-10-CM | POA: Diagnosis not present

## 2018-09-26 DIAGNOSIS — N183 Chronic kidney disease, stage 3 unspecified: Secondary | ICD-10-CM

## 2018-09-26 DIAGNOSIS — T8249XA Other complication of vascular dialysis catheter, initial encounter: Secondary | ICD-10-CM | POA: Diagnosis not present

## 2018-09-26 LAB — POCT HEMOGLOBIN-HEMACUE: Hemoglobin: 9.9 g/dL — ABNORMAL LOW (ref 13.0–17.0)

## 2018-09-26 LAB — FERRITIN: Ferritin: 284 ng/mL (ref 24–336)

## 2018-09-26 MED ORDER — EPOETIN ALFA-EPBX 10000 UNIT/ML IJ SOLN
20000.0000 [IU] | INTRAMUSCULAR | Status: DC
  Administered 2018-09-26: 20000 [IU] via SUBCUTANEOUS
  Filled 2018-09-26: qty 2

## 2018-09-29 ENCOUNTER — Encounter (HOSPITAL_COMMUNITY): Payer: BLUE CROSS/BLUE SHIELD

## 2018-09-29 ENCOUNTER — Encounter: Payer: BLUE CROSS/BLUE SHIELD | Admitting: Family

## 2018-09-29 DIAGNOSIS — T8249XA Other complication of vascular dialysis catheter, initial encounter: Secondary | ICD-10-CM | POA: Diagnosis not present

## 2018-09-29 DIAGNOSIS — N186 End stage renal disease: Secondary | ICD-10-CM | POA: Diagnosis not present

## 2018-09-30 ENCOUNTER — Encounter (HOSPITAL_COMMUNITY): Payer: BLUE CROSS/BLUE SHIELD

## 2018-09-30 ENCOUNTER — Encounter: Payer: BLUE CROSS/BLUE SHIELD | Admitting: Family

## 2018-10-01 DIAGNOSIS — T8249XA Other complication of vascular dialysis catheter, initial encounter: Secondary | ICD-10-CM | POA: Diagnosis not present

## 2018-10-01 DIAGNOSIS — N186 End stage renal disease: Secondary | ICD-10-CM | POA: Diagnosis not present

## 2018-10-03 DIAGNOSIS — N186 End stage renal disease: Secondary | ICD-10-CM | POA: Diagnosis not present

## 2018-10-03 DIAGNOSIS — T8249XA Other complication of vascular dialysis catheter, initial encounter: Secondary | ICD-10-CM | POA: Diagnosis not present

## 2018-10-06 ENCOUNTER — Ambulatory Visit (INDEPENDENT_AMBULATORY_CARE_PROVIDER_SITE_OTHER): Payer: Self-pay | Admitting: Family

## 2018-10-06 ENCOUNTER — Ambulatory Visit (HOSPITAL_COMMUNITY)
Admission: RE | Admit: 2018-10-06 | Discharge: 2018-10-06 | Disposition: A | Discharge status: Home / Self Care | Payer: BLUE CROSS/BLUE SHIELD | Source: Ambulatory Visit | Attending: Family | Admitting: Family

## 2018-10-06 ENCOUNTER — Encounter: Payer: Self-pay | Admitting: Family

## 2018-10-06 ENCOUNTER — Other Ambulatory Visit: Payer: Self-pay

## 2018-10-06 VITALS — BP 113/70 | HR 82 | Temp 98.4°F | Resp 18 | Ht 72.0 in | Wt 250.0 lb

## 2018-10-06 DIAGNOSIS — N184 Chronic kidney disease, stage 4 (severe): Secondary | ICD-10-CM | POA: Diagnosis not present

## 2018-10-06 DIAGNOSIS — N186 End stage renal disease: Secondary | ICD-10-CM | POA: Diagnosis not present

## 2018-10-06 DIAGNOSIS — Z992 Dependence on renal dialysis: Secondary | ICD-10-CM

## 2018-10-06 DIAGNOSIS — I770 Arteriovenous fistula, acquired: Secondary | ICD-10-CM

## 2018-10-06 DIAGNOSIS — T8249XA Other complication of vascular dialysis catheter, initial encounter: Secondary | ICD-10-CM | POA: Diagnosis not present

## 2018-10-06 NOTE — Progress Notes (Signed)
CC: Follow up s/p left first stage brachiobasilic fistula creation on 08-15-18   History of Present Illness  [name] [name] is a 37 y.o. (1980-10-14) male who is s/p left first stage brachiobasilic fistula creation on 08-15-18 by Dr. Donnetta Hutching. He then had placement of a right internal jugular vein tunneled dialysis catheter (23 cm Palindrome catheter) on 09-20-18 by Dr. Carlis Abbott.   I was informed at 1:17 that the pt left before I could see him. He was scheduled for 1:15 for me to see.   He had a left arm AVF duplex today.    Past Medical History:  Diagnosis Date  . Chronic kidney disease   . Hypertension   . Lupus ([place])   . Seizures ([place])   . Seizures ([place])   . Stroke ([place])   . Stroke [facility])    34 or 38 years of age.      Social History Social History   Tobacco Use  . Smoking status: Former Smoker    Packs/day: 0.25    Types: Cigarettes    Last attempt to quit: 12/21/2017    Years since quitting: 0.7  . Smokeless tobacco: Never Used  Substance Use Topics  . Alcohol use: Not Currently  . Drug use: No    Family History Family History  Problem Relation Age of Onset  . Diabetes Mother   . Diabetes Maternal Grandmother     Surgical History Past Surgical History:  Procedure Laterality Date  . AV FISTULA PLACEMENT Left 12/30/2017   Procedure: ARTERIOVENOUS (AV) FISTULA CREATION VERSUS INSERTION OF ARTERIOVENOUS GRAFT LEFT ARM;  Surgeon: Rosetta Posner, MD;  Location: [place];  Service: Vascular;  Laterality: Left;  . BASCILIC VEIN TRANSPOSITION Left 08/15/2018   Procedure: LEFT FIRST STAGE BASCILIC VEIN TRANSPOSITION;  Surgeon: Rosetta Posner, MD;  Location: [employer];  Service: Vascular;  Laterality: Left;  . INSERTION OF DIALYSIS CATHETER N/A 09/20/2018   Procedure: INSERTION OF TUNNELED DIALYSIS CATHETER RIGHT INTERNAL JUGULAR;  Surgeon: Marty Heck, MD;  Location: [facility];  Service: Vascular;  Laterality: N/A;  . RENAL BIOPSY      Allergies  Allergen Reactions  .  Zyrtec [Cetirizine] Swelling    Facial swelling    Current Outpatient Medications  Medication Sig Dispense Refill  . amLODipine (NORVASC) 10 MG tablet Take 1 tablet (10 mg total) by mouth daily. 90 tablet 3  . atorvastatin (LIPITOR) 40 MG tablet Take 1 tablet (40 mg total) by mouth daily. (Patient taking differently: Take 40 mg by mouth every evening. ) 90 tablet 3  . calcitRIOL (ROCALTROL) 0.25 MCG capsule Take 1 capsule (0.25 mcg total) by mouth daily. 30 capsule 2  . calcium acetate (PHOSLO) 667 MG capsule Take 1 capsule (667 mg total) by mouth See admin instructions. Take 667 mg with each meal and each snack    . carvedilol (COREG) 3.125 MG tablet Take 1 tablet (3.125 mg total) by mouth 2 (two) times daily with a meal. 60 tablet 1  . lamoTRIgine (LAMICTAL) 150 MG tablet Take 1 tablet (150 mg total) by mouth 2 (two) times daily. 60 tablet 0  . apixaban (ELIQUIS) 2.5 MG TABS tablet Take 1 tablet (2.5 mg total) by mouth 2 (two) times daily. 60 tablet 3   No current facility-administered medications for this visit.      REVIEW OF SYSTEMS: see HPI for pertinent positives and negatives    PHYSICAL EXAMINATION:  Vitals:   10/06/18 1249  BP: 113/70  Pulse: 82  Resp: 18  Temp: 98.4 F (36.9 C)  TempSrc: Oral  SpO2: 100%  Weight: 250 lb (113.4 kg)  Height: 6' (1.829 m)   Body mass index is 33.91 kg/m.  Unable to examine pt, he left before I could examine him.   Non-Invasive Vascular Imaging   Left arm Access Duplex  (Date: 10/06/2018):  Findings: +--------------------+----------+-----------------+--------+ AVF                 PSV (cm/s)Flow Vol (mL/min)Comments +--------------------+----------+-----------------+--------+ Native artery inflow   107           584                +--------------------+----------+-----------------+--------+ AVF Anastomosis        237                              +--------------------+----------+-----------------+--------+    +------------+----------+-------------+----------+-----------------------------+ OUTFLOW VEINPSV (cm/s)Diameter (cm)Depth (cm)          Describe            +------------+----------+-------------+----------+-----------------------------+ Prox UA         62        0.56        1.51                                 +------------+----------+-------------+----------+-----------------------------+ Mid UA         318        0.70        1.19   change in diameter and color                                                       flow changes          +------------+----------+-------------+----------+-----------------------------+ Dist UA        225        0.59        1.23                                 +------------+----------+-------------+----------+-----------------------------+ AC Fossa       345        1.01        0.73        partially-occlusive      +------------+----------+-------------+----------+-----------------------------+   Summary: Arteriovenous fistula-Velocities less than 100cm/s noted in the in the proximal and mid upper arm. Color and velocity changes noted at the mid upper arm without stenosis observed, mild change in vessel diameter. Partially occlusive thrombus observed in the immediate venous outflow of the basilic vein (just beyond the Anastomosis).     Medical Decision Making  [name] [name] is a 38 y.o. male who is s/p left first stage brachiobasilic fistula creation on 08-15-18 by Dr. Donnetta Hutching. He then had placement of a right internal jugular vein tunneled dialysis catheter (23 cm Palindrome catheter) on 09-20-18 by Dr. Carlis Abbott.   I was informed at 1:17 that the pt left before I could see him. He was scheduled for 1:15 for me to see.   He had a left arm AVF duplex today which showed depths of 0.73 to 1.51 cm; diameters of 0.56,  0.59, 0.7, and 1 cm.  Arteriovenous fistula-Velocities less than 100cm/s noted in the in the proximal and mid upper  arm. Color and velocity changes noted at the mid upper arm without stenosis observed, mild change in vessel diameter. Partially occlusive thrombus observed in the immediate venous outflow of the basilic vein (just beyond the Anastomosis).    I discussed the AVF duplex results with Dr. Trula Slade, and that pt left before I could examine him.  AVF duplex indicates that depths are too deep to access, diameters are adequate.  We cannot schedule a superficialization of his AVF until he is examined.   I spoke with pt by phone at [phone]. He states that he dialyzes M-W-F via his TDC. He denies any steal type symptoms in his left arm or hand. He denies any problems with his AV fistula.  I discussed his AVF duplex results with him, and that he needs to be examined by myself or a PA or one of our vascular surgeons before we can schedule the second stage brachiobasilic fistula.  He agreed to this; one of our [facility] administrative staff will call pt to schedule this.      Clemon Chambers, RN, MSN, FNP-C [facility] Office: [phone]  10/06/2018, 1:27 PM  Clinic MD: Trula Slade

## 2018-10-08 DIAGNOSIS — N186 End stage renal disease: Secondary | ICD-10-CM | POA: Diagnosis not present

## 2018-10-08 DIAGNOSIS — T8249XA Other complication of vascular dialysis catheter, initial encounter: Secondary | ICD-10-CM | POA: Diagnosis not present

## 2018-10-10 DIAGNOSIS — N186 End stage renal disease: Secondary | ICD-10-CM | POA: Diagnosis not present

## 2018-10-10 DIAGNOSIS — T8249XA Other complication of vascular dialysis catheter, initial encounter: Secondary | ICD-10-CM | POA: Diagnosis not present

## 2018-10-13 DIAGNOSIS — N186 End stage renal disease: Secondary | ICD-10-CM | POA: Diagnosis not present

## 2018-10-13 DIAGNOSIS — T8249XA Other complication of vascular dialysis catheter, initial encounter: Secondary | ICD-10-CM | POA: Diagnosis not present

## 2018-10-15 DIAGNOSIS — N186 End stage renal disease: Secondary | ICD-10-CM | POA: Diagnosis not present

## 2018-10-15 DIAGNOSIS — T8249XA Other complication of vascular dialysis catheter, initial encounter: Secondary | ICD-10-CM | POA: Diagnosis not present

## 2018-10-16 ENCOUNTER — Telehealth (HOSPITAL_COMMUNITY): Payer: Self-pay | Admitting: Rehabilitation

## 2018-10-16 DIAGNOSIS — N186 End stage renal disease: Secondary | ICD-10-CM | POA: Diagnosis not present

## 2018-10-16 DIAGNOSIS — I129 Hypertensive chronic kidney disease with stage 1 through stage 4 chronic kidney disease, or unspecified chronic kidney disease: Secondary | ICD-10-CM | POA: Diagnosis not present

## 2018-10-16 DIAGNOSIS — Z992 Dependence on renal dialysis: Secondary | ICD-10-CM | POA: Diagnosis not present

## 2018-10-16 NOTE — Telephone Encounter (Signed)
The above patient or their representative was contacted and gave the following answers to these questions:         Do you have any of the following symptoms? No  Fever                    Cough                   Shortness of breath  Do  you have any of the following other symptoms? No   muscle pain         vomiting,        diarrhea        rash         weakness        red eye        abdominal pain         bruising          bruising or bleeding              joint pain           severe headache    Have you been in contact with someone who was or has been sick in the past 2 weeks? No  Yes                 Unsure                         Unable to assess   Does the person that you were in contact with have any of the following symptoms?   Cough         shortness of breath           muscle pain         vomiting,            diarrhea            rash            weakness           fever            red eye           abdominal pain           bruising  or  bleeding                joint pain                severe headache               Have you  or someone you have been in contact with traveled internationally in th last month? No        If yes, which countries?   Have you  or someone you have been in contact with traveled outside [place] in th last month? No        If yes, which state and city?   COMMENTS OR ACTION PLAN FOR THIS PATIENT: Patient has mask to wear to appt. and will do so.

## 2018-10-17 ENCOUNTER — Other Ambulatory Visit: Payer: Self-pay

## 2018-10-17 ENCOUNTER — Encounter: Payer: Self-pay | Admitting: Vascular Surgery

## 2018-10-17 ENCOUNTER — Ambulatory Visit (INDEPENDENT_AMBULATORY_CARE_PROVIDER_SITE_OTHER): Payer: Self-pay | Admitting: Vascular Surgery

## 2018-10-17 VITALS — BP 94/63 | HR 96 | Temp 98.6°F | Resp 18 | Ht 72.0 in | Wt 251.0 lb

## 2018-10-17 DIAGNOSIS — Z992 Dependence on renal dialysis: Secondary | ICD-10-CM

## 2018-10-17 DIAGNOSIS — N186 End stage renal disease: Secondary | ICD-10-CM

## 2018-10-17 DIAGNOSIS — T8249XA Other complication of vascular dialysis catheter, initial encounter: Secondary | ICD-10-CM | POA: Diagnosis not present

## 2018-10-17 NOTE — Progress Notes (Signed)
    Subjective:     Patient ID: [name] [name], male   DOB: 03-02-1981, 39 y.o.   MRN: [id number]  HPI 38 year old male follows up after left arm for stage basilic vein fistula creation.  Duplex previously performed but was not seen at the time.  Currently on dialysis via catheter.  He has a previous failed left arm cephalic vein fistula.   Review of Systems No left arm pain    Objective:   Physical Exam Vitals:   10/17/18 1336  BP: 94/63  Pulse: 96  Resp: 18  Temp: 98.6 F (37 C)  SpO2: 98%      Awake alert oriented Palpable left radial pulse Left arm AV fistula has pulsatility with minimal thrill near the antecubitum  I reviewed his previous dialysis duplex which demonstrates flow volume 584 mL/min diameter up to 0.7 cm with a depth of greater than a centimeter throughout most of the area.  There is a partially occlusive thrombus at the level of the antecubital fossa.  Assessment:     38 year old male follows up after left for stage basilic vein fistula.  He is now on dialysis via catheter placed early last month.    Plan:     Will plan for left second stage basilic vein fistula.  There is some chance that he will need graft but we will evaluate at the time of surgery.    Cassandra Mcmanaman C. Donzetta Matters, MD [facility] Office: [phone] Pager: [phone]

## 2018-10-20 ENCOUNTER — Other Ambulatory Visit: Payer: Self-pay | Admitting: *Deleted

## 2018-10-20 DIAGNOSIS — T8249XA Other complication of vascular dialysis catheter, initial encounter: Secondary | ICD-10-CM | POA: Diagnosis not present

## 2018-10-20 DIAGNOSIS — N186 End stage renal disease: Secondary | ICD-10-CM | POA: Diagnosis not present

## 2018-10-21 MED FILL — CALCITRIOL 0.25 MCG CAPS: 0.25 | 30 days supply | Qty: 30 | Fill #3

## 2018-10-21 MED FILL — ELIQUIS 2.5 MG TABLET: 2.5 | 30 days supply | Qty: 60 | Fill #2

## 2018-10-21 MED FILL — HYDROCHLOROTHIAZIDE 25 MG T: 25 | 60 days supply | Qty: 60 | Fill #3

## 2018-10-21 MED FILL — CARVEDILOL 3.125 MG TABLET: 3.125 | 30 days supply | Qty: 60 | Fill #3

## 2018-10-21 MED FILL — AMLODIPINE BESYLATE 10 MG T: 10 | 90 days supply | Qty: 90 | Fill #3

## 2018-10-21 MED FILL — ATORVASTATIN 40 MG TABLET: 40 | 90 days supply | Qty: 90 | Fill #2

## 2018-10-22 DIAGNOSIS — N186 End stage renal disease: Secondary | ICD-10-CM | POA: Diagnosis not present

## 2018-10-22 DIAGNOSIS — T8249XA Other complication of vascular dialysis catheter, initial encounter: Secondary | ICD-10-CM | POA: Diagnosis not present

## 2018-10-24 ENCOUNTER — Encounter (HOSPITAL_COMMUNITY): Payer: BLUE CROSS/BLUE SHIELD

## 2018-10-24 ENCOUNTER — Other Ambulatory Visit: Payer: Self-pay | Admitting: *Deleted

## 2018-10-24 DIAGNOSIS — N186 End stage renal disease: Secondary | ICD-10-CM | POA: Diagnosis not present

## 2018-10-24 DIAGNOSIS — T8249XA Other complication of vascular dialysis catheter, initial encounter: Secondary | ICD-10-CM | POA: Diagnosis not present

## 2018-10-27 ENCOUNTER — Other Ambulatory Visit (HOSPITAL_COMMUNITY): Payer: BLUE CROSS/BLUE SHIELD

## 2018-10-27 DIAGNOSIS — N186 End stage renal disease: Secondary | ICD-10-CM | POA: Diagnosis not present

## 2018-10-27 DIAGNOSIS — Z23 Encounter for immunization: Secondary | ICD-10-CM | POA: Diagnosis not present

## 2018-10-27 DIAGNOSIS — T8249XA Other complication of vascular dialysis catheter, initial encounter: Secondary | ICD-10-CM | POA: Diagnosis not present

## 2018-10-29 ENCOUNTER — Other Ambulatory Visit (HOSPITAL_COMMUNITY)
Admission: RE | Admit: 2018-10-29 | Discharge: 2018-10-29 | Disposition: A | Discharge status: Home / Self Care | Payer: BLUE CROSS/BLUE SHIELD | Source: Ambulatory Visit | Attending: Vascular Surgery | Admitting: Vascular Surgery

## 2018-10-29 ENCOUNTER — Other Ambulatory Visit: Payer: Self-pay

## 2018-10-29 ENCOUNTER — Encounter (HOSPITAL_COMMUNITY): Payer: Self-pay | Admitting: *Deleted

## 2018-10-29 DIAGNOSIS — Z1159 Encounter for screening for other viral diseases: Secondary | ICD-10-CM | POA: Insufficient documentation

## 2018-10-29 DIAGNOSIS — Z23 Encounter for immunization: Secondary | ICD-10-CM | POA: Diagnosis not present

## 2018-10-29 DIAGNOSIS — T8249XA Other complication of vascular dialysis catheter, initial encounter: Secondary | ICD-10-CM | POA: Diagnosis not present

## 2018-10-29 DIAGNOSIS — N186 End stage renal disease: Secondary | ICD-10-CM | POA: Diagnosis not present

## 2018-10-29 LAB — SARS CORONAVIRUS 2 BY RT PCR (HOSPITAL ORDER, PERFORMED IN ~~LOC~~ HOSPITAL LAB): SARS Coronavirus 2: NEGATIVE

## 2018-10-29 NOTE — Progress Notes (Signed)
Pt stated that he was instructed to hold Eliquis today.

## 2018-10-29 NOTE — Progress Notes (Signed)
Pt denies SOB, chest pain, and being under the care of a cardiologist.. Pt denies having a stress test and cardiac cath. Pt made aware to stop taking vitamins, fish oil and herbal medications. Do not take any NSAIDs ie: Ibuprofen, Advil, Naproxen (Aleve), Motrin, BC and Goody Powder.  Pt denies that he and Shawn tested positive for COVID-19.  Coronavirus Screening Pt denies that he and Shawn experienced the following symptoms:  Cough yes/no: No Fever (>100.85F)  yes/no: No Runny nose yes/no: No Sore throat yes/no: No Difficulty breathing/shortness of breath  yes/no: No  Have you or a family member traveled in the last 14 days and where? yes/no: No  Pt reminded that hospital visitation restrictions are in effect and the importance of the restrictions.  Pt verbalized understanding of all pre-op instructions.

## 2018-10-30 ENCOUNTER — Ambulatory Visit (HOSPITAL_COMMUNITY)
Admission: RE | Admit: 2018-10-30 | Discharge: 2018-10-30 | Disposition: A | Discharge status: Home / Self Care | Payer: BLUE CROSS/BLUE SHIELD | Attending: Vascular Surgery | Admitting: Vascular Surgery

## 2018-10-30 ENCOUNTER — Ambulatory Visit (HOSPITAL_COMMUNITY): Payer: BLUE CROSS/BLUE SHIELD | Admitting: Anesthesiology

## 2018-10-30 ENCOUNTER — Encounter (HOSPITAL_COMMUNITY)
Admission: RE | Disposition: A | Discharge status: Home / Self Care | Payer: Self-pay | Source: Home / Self Care | Attending: Vascular Surgery

## 2018-10-30 ENCOUNTER — Encounter (HOSPITAL_COMMUNITY): Payer: Self-pay

## 2018-10-30 ENCOUNTER — Other Ambulatory Visit: Payer: Self-pay

## 2018-10-30 DIAGNOSIS — Z87891 Personal history of nicotine dependence: Secondary | ICD-10-CM | POA: Insufficient documentation

## 2018-10-30 DIAGNOSIS — I120 Hypertensive chronic kidney disease with stage 5 chronic kidney disease or end stage renal disease: Secondary | ICD-10-CM | POA: Diagnosis not present

## 2018-10-30 DIAGNOSIS — N179 Acute kidney failure, unspecified: Secondary | ICD-10-CM | POA: Diagnosis not present

## 2018-10-30 DIAGNOSIS — Z8673 Personal history of transient ischemic attack (TIA), and cerebral infarction without residual deficits: Secondary | ICD-10-CM | POA: Diagnosis not present

## 2018-10-30 DIAGNOSIS — D649 Anemia, unspecified: Secondary | ICD-10-CM | POA: Diagnosis not present

## 2018-10-30 DIAGNOSIS — D696 Thrombocytopenia, unspecified: Secondary | ICD-10-CM | POA: Insufficient documentation

## 2018-10-30 DIAGNOSIS — Z992 Dependence on renal dialysis: Secondary | ICD-10-CM | POA: Insufficient documentation

## 2018-10-30 DIAGNOSIS — R569 Unspecified convulsions: Secondary | ICD-10-CM | POA: Insufficient documentation

## 2018-10-30 DIAGNOSIS — N186 End stage renal disease: Secondary | ICD-10-CM

## 2018-10-30 HISTORY — PX: BASCILIC VEIN TRANSPOSITION: SHX5742

## 2018-10-30 HISTORY — DX: Presence of spectacles and contact lenses: Z97.3

## 2018-10-30 LAB — POCT I-STAT 4, (NA,K, GLUC, HGB,HCT)
Glucose, Bld: 95 mg/dL (ref 70–99)
HCT: 37 % — ABNORMAL LOW (ref 39.0–52.0)
Hemoglobin: 12.6 g/dL — ABNORMAL LOW (ref 13.0–17.0)
Potassium: 3.7 mmol/L (ref 3.5–5.1)
Sodium: 137 mmol/L (ref 135–145)

## 2018-10-30 LAB — PROTIME-INR
INR: 1 (ref 0.8–1.2)
Prothrombin Time: 13.4 seconds (ref 11.4–15.2)

## 2018-10-30 SURGERY — TRANSPOSITION, VEIN, BASILIC
Anesthesia: Monitor Anesthesia Care | Site: Arm Upper | Laterality: Left

## 2018-10-30 MED ORDER — MIDAZOLAM HCL 2 MG/2ML IJ SOLN
INTRAMUSCULAR | Status: AC
  Filled 2018-10-30: qty 2

## 2018-10-30 MED ORDER — FENTANYL CITRATE (PF) 250 MCG/5ML IJ SOLN
INTRAMUSCULAR | Status: AC
  Filled 2018-10-30: qty 5

## 2018-10-30 MED ORDER — MIDAZOLAM HCL 5 MG/5ML IJ SOLN
INTRAMUSCULAR | Status: DC | PRN
  Administered 2018-10-30: 2 mg via INTRAVENOUS

## 2018-10-30 MED ORDER — FENTANYL CITRATE (PF) 100 MCG/2ML IJ SOLN
INTRAMUSCULAR | Status: DC | PRN
  Administered 2018-10-30: 100 ug via INTRAVENOUS

## 2018-10-30 MED ORDER — SODIUM CHLORIDE 0.9 % IV SOLN
INTRAVENOUS | Status: DC
  Administered 2018-10-30 (×2): via INTRAVENOUS

## 2018-10-30 MED ORDER — EPHEDRINE SULFATE-NACL 50-0.9 MG/10ML-% IV SOSY
PREFILLED_SYRINGE | INTRAVENOUS | Status: DC | PRN
  Administered 2018-10-30 (×3): 10 mg via INTRAVENOUS

## 2018-10-30 MED ORDER — LIDOCAINE HCL 1 % IJ SOLN
INTRAMUSCULAR | Status: AC
  Filled 2018-10-30: qty 20

## 2018-10-30 MED ORDER — PROPOFOL 10 MG/ML IV BOLUS
INTRAVENOUS | Status: AC
  Filled 2018-10-30: qty 20

## 2018-10-30 MED ORDER — CEFAZOLIN SODIUM-DEXTROSE 2-4 GM/100ML-% IV SOLN
2.0000 g | INTRAVENOUS | Status: AC
  Administered 2018-10-30: 2 g via INTRAVENOUS

## 2018-10-30 MED ORDER — SODIUM CHLORIDE 0.9 % IV SOLN
INTRAVENOUS | Status: AC
  Filled 2018-10-30: qty 1.2

## 2018-10-30 MED ORDER — OXYCODONE-ACETAMINOPHEN 5-325 MG PO TABS: 1.0000 | ORAL_TABLET | Freq: Four times a day (QID) | ORAL | 0 refills | Status: DC | PRN

## 2018-10-30 MED ORDER — PHENYLEPHRINE 40 MCG/ML (10ML) SYRINGE FOR IV PUSH (FOR BLOOD PRESSURE SUPPORT)
PREFILLED_SYRINGE | INTRAVENOUS | Status: AC
  Filled 2018-10-30: qty 10

## 2018-10-30 MED ORDER — PROPOFOL 10 MG/ML IV BOLUS
INTRAVENOUS | Status: DC | PRN
  Administered 2018-10-30: 20 mg via INTRAVENOUS
  Administered 2018-10-30 (×2): 10 mg via INTRAVENOUS

## 2018-10-30 MED ORDER — LIDOCAINE HCL (PF) 1 % IJ SOLN
INTRAMUSCULAR | Status: AC
  Filled 2018-10-30: qty 30

## 2018-10-30 MED ORDER — 0.9 % SODIUM CHLORIDE (POUR BTL) OPTIME
TOPICAL | Status: DC | PRN
  Administered 2018-10-30: 1000 mL

## 2018-10-30 MED ORDER — LIDOCAINE HCL 1 % IJ SOLN
INTRAMUSCULAR | Status: DC | PRN
  Administered 2018-10-30: 30 mL

## 2018-10-30 MED ORDER — EPHEDRINE 5 MG/ML INJ
INTRAVENOUS | Status: AC
  Filled 2018-10-30: qty 10

## 2018-10-30 MED ORDER — PROPOFOL 500 MG/50ML IV EMUL
INTRAVENOUS | Status: DC | PRN
  Administered 2018-10-30: 13:00:00 via INTRAVENOUS
  Administered 2018-10-30: 75 ug/kg/min via INTRAVENOUS

## 2018-10-30 MED ORDER — CEFAZOLIN SODIUM-DEXTROSE 2-4 GM/100ML-% IV SOLN
INTRAVENOUS | Status: AC
  Filled 2018-10-30: qty 100

## 2018-10-30 MED ORDER — SODIUM CHLORIDE 0.9 % IV SOLN
INTRAVENOUS | Status: DC | PRN
  Administered 2018-10-30: 12:00:00

## 2018-10-30 SURGICAL SUPPLY — 37 items
ADH SKN CLS APL DERMABOND .7 (GAUZE/BANDAGES/DRESSINGS) ×1
ADH SKN CLS LQ APL DERMABOND (GAUZE/BANDAGES/DRESSINGS) ×2
ARMBAND PINK RESTRICT EXTREMIT (MISCELLANEOUS) ×2 IMPLANT
BLADE SURG 10 STRL SS (BLADE) ×1 IMPLANT
CANISTER SUCT 3000ML PPV (MISCELLANEOUS) ×2 IMPLANT
CLIP VESOCCLUDE MED 24/CT (CLIP) IMPLANT
CLIP VESOCCLUDE MED 6/CT (CLIP) IMPLANT
CLIP VESOCCLUDE SM WIDE 24/CT (CLIP) IMPLANT
CLIP VESOCCLUDE SM WIDE 6/CT (CLIP) IMPLANT
COVER PROBE W GEL 5X96 (DRAPES) ×2 IMPLANT
COVER WAND RF STERILE (DRAPES) ×1 IMPLANT
DERMABOND ADHESIVE PROPEN (GAUZE/BANDAGES/DRESSINGS) ×2
DERMABOND ADVANCED (GAUZE/BANDAGES/DRESSINGS) ×1
DERMABOND ADVANCED .7 DNX12 (GAUZE/BANDAGES/DRESSINGS) ×1 IMPLANT
DERMABOND ADVANCED .7 DNX6 (GAUZE/BANDAGES/DRESSINGS) IMPLANT
ELECT REM PT RETURN 9FT ADLT (ELECTROSURGICAL) ×2
ELECTRODE REM PT RTRN 9FT ADLT (ELECTROSURGICAL) ×1 IMPLANT
GLOVE BIO SURGEON STRL SZ7.5 (GLOVE) ×2 IMPLANT
GOWN STRL REUS W/ TWL LRG LVL3 (GOWN DISPOSABLE) ×2 IMPLANT
GOWN STRL REUS W/ TWL XL LVL3 (GOWN DISPOSABLE) ×1 IMPLANT
GOWN STRL REUS W/TWL LRG LVL3 (GOWN DISPOSABLE) ×4
GOWN STRL REUS W/TWL XL LVL3 (GOWN DISPOSABLE) ×2
GRAFT GORETEX STRT 4-7X45 (Vascular Products) ×1 IMPLANT
KIT BASIN OR (CUSTOM PROCEDURE TRAY) ×2 IMPLANT
KIT TURNOVER KIT B (KITS) ×2 IMPLANT
NS IRRIG 1000ML POUR BTL (IV SOLUTION) ×2 IMPLANT
PACK CV ACCESS (CUSTOM PROCEDURE TRAY) ×2 IMPLANT
PAD ARMBOARD 7.5X6 YLW CONV (MISCELLANEOUS) ×4 IMPLANT
SPONGE LAP 18X18 X RAY DECT (DISPOSABLE) ×1 IMPLANT
SUT MNCRL AB 4-0 PS2 18 (SUTURE) ×3 IMPLANT
SUT PROLENE 6 0 BV (SUTURE) ×5 IMPLANT
SUT SILK 2 0 SH (SUTURE) IMPLANT
SUT VIC AB 3-0 SH 27 (SUTURE) ×4
SUT VIC AB 3-0 SH 27X BRD (SUTURE) ×1 IMPLANT
TOWEL GREEN STERILE (TOWEL DISPOSABLE) ×2 IMPLANT
UNDERPAD 30X30 (UNDERPADS AND DIAPERS) ×2 IMPLANT
WATER STERILE IRR 1000ML POUR (IV SOLUTION) ×2 IMPLANT

## 2018-10-30 NOTE — Anesthesia Preprocedure Evaluation (Addendum)
Anesthesia Evaluation  Patient identified by MRN, date of birth, ID band Patient awake    Reviewed: Allergy & Precautions, NPO status , Patient's Chart, lab work & pertinent test results, reviewed documented beta blocker date and time   History of Anesthesia Complications Negative for: history of anesthetic complications  Airway Mallampati: IV  TM Distance: >3 FB Neck ROM: Full   Comment: Poor effort when checking mallampati Dental  (+) Dental Advisory Given, Chipped,    Pulmonary former smoker,    breath sounds clear to auscultation       Cardiovascular hypertension, Pt. on home beta blockers and Pt. on medications  Rhythm:Regular Rate:Normal     Neuro/Psych Seizures -, Well Controlled,  CVA, No Residual Symptoms negative psych ROS   GI/Hepatic negative GI ROS, Neg liver ROS,   Endo/Other   Obesity   Renal/GU ESRFRenal disease     Musculoskeletal negative musculoskeletal ROS (+)   Abdominal   Peds  Hematology  (+) anemia ,  Thrombocytopenia    Anesthesia Other Findings Lupus  Reproductive/Obstetrics                             Anesthesia Physical  Anesthesia Plan  ASA: IV  Anesthesia Plan: MAC   Post-op Pain Management:    Induction: Intravenous  PONV Risk Score and Plan: 2 and Treatment may vary due to age or medical condition, Ondansetron and Midazolam  Airway Management Planned: Simple Face Mask  Additional Equipment: None  Intra-op Plan:   Post-operative Plan:   Informed Consent: I have reviewed the patients History and Physical, chart, labs and discussed the procedure including the risks, benefits and alternatives for the proposed anesthesia with the patient or authorized representative who has indicated his/her understanding and acceptance.     Dental advisory given  Plan Discussed with: CRNA  Anesthesia Plan Comments:        Anesthesia Quick  Evaluation

## 2018-10-30 NOTE — Transfer of Care (Signed)
Immediate Anesthesia Transfer of Care Note  Patient: [name] [name]  Procedure(s) Performed: INSERTION OF GORE-TEX GRAFT LEFT UPPER ARM (Left Arm Upper)  Patient Location: PACU  Anesthesia Type:General  Level of Consciousness: drowsy and patient cooperative  Airway & Oxygen Therapy: Patient Spontanous Breathing and Patient connected to nasal cannula oxygen  Post-op Assessment: Report given to RN  Post vital signs: Reviewed and stable  Last Vitals:  Vitals Value Taken Time  BP    Temp    Pulse 79 10/30/2018  1:56 PM  Resp 14 10/30/2018  1:56 PM  SpO2 96 % 10/30/2018  1:56 PM  Vitals shown include unvalidated device data.  Last Pain:  Vitals:   10/30/18 1026  TempSrc: Oral         Complications: No apparent anesthesia complications

## 2018-10-30 NOTE — H&P (Signed)
   History and Physical Update  The patient was interviewed and re-examined.  The patient's previous History and Physical has been reviewed and is unchanged from recent office visit. Plan for left second stage basilic vein fistula in OR today.  Keeon Zurn C. Donzetta Matters, MD [facility] Office: [phone] Pager: [phone]   10/30/2018, 10:10 AM

## 2018-10-30 NOTE — Anesthesia Procedure Notes (Signed)
Procedure Name: MAC Date/Time: 10/30/2018 12:15 PM Performed by: Moshe Salisbury, CRNA Pre-anesthesia Checklist: Patient identified, Emergency Drugs available, Suction available, Patient being monitored and Timeout performed Patient Re-evaluated:Patient Re-evaluated prior to induction Oxygen Delivery Method: Nasal cannula Ventilation: Nasal airway inserted- appropriate to patient size Placement Confirmation: positive ETCO2 Dental Injury: Teeth and Oropharynx as per pre-operative assessment

## 2018-10-30 NOTE — Discharge Instructions (Signed)
° °  [facility]  Discharge Instructions  AV Fistula or Graft Surgery for Dialysis Access  Please refer to the following instructions for your post-procedure care. Your surgeon or physician assistant will discuss any changes with you.  Activity  You may drive the day following your surgery, if you are comfortable and no longer taking prescription pain medication. Resume full activity as the soreness in your incision resolves.  Bathing/Showering  You may shower after you go home. Keep your incision dry for 48 hours. Do not soak in a bathtub, hot tub, or swim until the incision heals completely. You may not shower if you have a hemodialysis catheter.  Incision Care  Clean your incision with mild soap and water after 48 hours. Pat the area dry with a clean towel. You do not need a bandage unless otherwise instructed. Do not apply any ointments or creams to your incision. You may have skin glue on your incision. Do not peel it off. It will come off on its own in about one week. Your arm may swell a bit after surgery. To reduce swelling use pillows to elevate your arm so it is above your heart. Your doctor will tell you if you need to lightly wrap your arm with an ACE bandage.  Diet  Resume your normal diet. There are not special food restrictions following this procedure. In order to heal from your surgery, it is CRITICAL to get adequate nutrition. Your body requires vitamins, minerals, and protein. Vegetables are the best source of vitamins and minerals. Vegetables also provide the perfect balance of protein. Processed food has little nutritional value, so try to avoid this.  Medications  Resume taking all of your medications. If your incision is causing pain, you may take over-the counter pain relievers such as acetaminophen (Tylenol). If you were prescribed a stronger pain medication, please be aware these medications can cause nausea and constipation. Prevent  nausea by taking the medication with a snack or meal. Avoid constipation by drinking plenty of fluids and eating foods with high amount of fiber, such as fruits, vegetables, and grains.  Do not take Tylenol if you are taking prescription pain medications.  RESTART ELIQUIS ON 10/31/2018  Follow up Your surgeon may want to see you in the office following your access surgery. If so, this will be arranged at the time of your surgery.  Please call us immediately for any of the following conditions:  Increased pain, redness, drainage (pus) from your incision site Fever of 101 degrees or higher Severe or worsening pain at your incision site Hand pain or numbness.  Reduce your risk of vascular disease:  Stop smoking. If you would like help, call [facility] at 1-800-QUIT-NOW (562)180-2837) or [place] at [place] your cholesterol Maintain a desired weight Control your diabetes Keep your blood pressure down  Dialysis  It will take several weeks to several months for your new dialysis access to be ready for use. Your surgeon will determine when it is okay to use it. Your nephrologist will continue to direct your dialysis. You can continue to use your Permcath until your new access is ready for use.   10/30/2018 [name] [name] [id number] 1981/03/21  Surgeon(s): Waynetta Sandy, MD  Procedure(s): INSERTION OF GORE-TEX GRAFT LEFT UPPER ARM  x Do not stick graft for 4 weeks    If you have any questions, please call the office at [phone].

## 2018-10-30 NOTE — Op Note (Signed)
Patient name: MAYFORD ALBERG MRN: [id number] DOB: [birth year] Sex: male  10/30/2018 Pre-operative Diagnosis: End-stage renal disease Post-operative diagnosis:  Same Surgeon:  Erlene Quan C. Donzetta Matters, MD Assistant: Leontine Locket, PA Procedure Performed: 1.  Exploration and ligation of left arm basilic vein fistula 2.  Placement of brachial to axillary 4-7 mm graft  Indications: 38 year old male is undergone previous cephalic and basilic vein fistulas.  He is indicated for second stage fistula.  On preoperative physical exam there is no palpable thrill minimal flow although the sizes suggestive of maturation.  He is indicated for second stage fistula versus creation of a graft in his left upper extremity.  Findings: On preoperative ultrasound I evaluated the fistula appeared to be sclerotic in the midsegment and at the anastomosis.  On open exploration this was indeed consistent and appeared occluded in both those areas.  I elected to place a graft.  After completion there was minimal flow through the graft so I redid the anastomosis.  Unfortunately still has minimal flow which I do not believe to be an arterial problem.  I do not think that jumping up to an axillary artery was necessarily going to improve this given week axillary pulse.  This graft does not work patient will need consideration of right upper extremity access versus left upper extremity angiogram with possible further access in that arm.   Procedure:  The patient was identified in the holding area and taken to the operating room where is put supine operative table and MAC anesthesia induced.  He sterilely prepped and draped in the left upper extremity usual fashion antibiotics were minister timeout called.  We used ultrasound to identify the fistula which did appear sclerotic at the anastomosis as well as the mid segment of the upper arm.  Entire left upper extremity overlying the fistula as well as the tunneling site were anesthetized 1%  lidocaine plain.  Incision was made over the anastomotic site.  We dissected down found what appeared to be a sclerotic fistula.  We traced this up higher protected the nerve.  Counterincision was made.  This point fistula was noted to be nonsalvageable.  Incision was made in the axilla we dissected down the axillary vein which did appear amenable for anastomosis and a vessel loop was placed around this.  We dissected out the entire brachial artery above and below the anastomosis.  Vesseloops were placed around these as well.  4-7 mm graft was tunneled.  We clamped the axillary vein proximally distally opened longitudinally trimmed the graft to size and sewed end-to-side with 6-0 Prolene suture.  Upon completion we then flushed through the graft and clamped it.  We then clamped our brachial artery distally proximally.  We removed our existing fistula and ligated this transected.  We opened up our previous anastomosis.  This point we right the 4 mm aspect of our graft we sewed it end-to-side with 6-0 Prolene suture.  Prior to completion anastomosis well flushing all directions.  Upon completion we had very weak flow through our graft.  Takedown anastomosis reevaluated again week flow.  This time we elected to re-clamp our artery in our graft.  We then re-tunneled our graft maintaining orientation this did not appear to be the issue.  We opened our anastomosis from our previous fistula for a longer segment.  We now beveled our graft there was approximately 5 to 6 mm diameter at the anastomosis.  We now sewed a new end-to-side anastomosis with 6-0 Prolene suture.  We again flushed the brachial artery with heparinized saline prior to clamping.  Prior to completing anastomosis we allowed flushing all directions.  Upon completion we did have somewhat improved flow through the graft although still had a weak outflow signal.  I elected that nothing further can be done given that the axillary artery also has a weak pulse.   There was a palpable radial pulse at the wrist.  Satisfied with this we irrigated the wounds obtain hemostasis and closed in layers with Vicryl Monocryl.  Dermabond placed to level the skin.  He tolerated procedure without immediate complication.  All counts were correct at completion.  EBL 100 cc   Danayah Smyre C. Donzetta Matters, MD [facility] [place] Office: [phone] Pager: [phone]

## 2018-10-31 ENCOUNTER — Encounter (HOSPITAL_COMMUNITY): Payer: Self-pay | Admitting: Vascular Surgery

## 2018-10-31 DIAGNOSIS — T8249XA Other complication of vascular dialysis catheter, initial encounter: Secondary | ICD-10-CM | POA: Diagnosis not present

## 2018-10-31 DIAGNOSIS — Z23 Encounter for immunization: Secondary | ICD-10-CM | POA: Diagnosis not present

## 2018-10-31 DIAGNOSIS — N186 End stage renal disease: Secondary | ICD-10-CM | POA: Diagnosis not present

## 2018-11-03 DIAGNOSIS — T8249XA Other complication of vascular dialysis catheter, initial encounter: Secondary | ICD-10-CM | POA: Diagnosis not present

## 2018-11-03 DIAGNOSIS — N186 End stage renal disease: Secondary | ICD-10-CM | POA: Diagnosis not present

## 2018-11-03 DIAGNOSIS — N2581 Secondary hyperparathyroidism of renal origin: Secondary | ICD-10-CM | POA: Diagnosis not present

## 2018-11-05 DIAGNOSIS — N2581 Secondary hyperparathyroidism of renal origin: Secondary | ICD-10-CM | POA: Insufficient documentation

## 2018-11-05 DIAGNOSIS — N186 End stage renal disease: Secondary | ICD-10-CM | POA: Diagnosis not present

## 2018-11-05 DIAGNOSIS — T8249XA Other complication of vascular dialysis catheter, initial encounter: Secondary | ICD-10-CM | POA: Diagnosis not present

## 2018-11-07 DIAGNOSIS — T8249XA Other complication of vascular dialysis catheter, initial encounter: Secondary | ICD-10-CM | POA: Diagnosis not present

## 2018-11-07 DIAGNOSIS — N2581 Secondary hyperparathyroidism of renal origin: Secondary | ICD-10-CM | POA: Diagnosis not present

## 2018-11-07 DIAGNOSIS — N186 End stage renal disease: Secondary | ICD-10-CM | POA: Diagnosis not present

## 2018-11-10 DIAGNOSIS — N186 End stage renal disease: Secondary | ICD-10-CM | POA: Diagnosis not present

## 2018-11-10 DIAGNOSIS — T8249XA Other complication of vascular dialysis catheter, initial encounter: Secondary | ICD-10-CM | POA: Diagnosis not present

## 2018-11-10 DIAGNOSIS — N2581 Secondary hyperparathyroidism of renal origin: Secondary | ICD-10-CM | POA: Diagnosis not present

## 2018-11-10 NOTE — Anesthesia Postprocedure Evaluation (Signed)
Anesthesia Post Note  Patient: [name] [name]  Procedure(s) Performed: INSERTION OF GORE-TEX GRAFT LEFT UPPER ARM (Left Arm Upper)     Patient location during evaluation: PACU Anesthesia Type: MAC Level of consciousness: awake and alert Pain management: pain level controlled Vital Signs Assessment: post-procedure vital signs reviewed and stable Respiratory status: spontaneous breathing Cardiovascular status: stable Anesthetic complications: no    Last Vitals:  Vitals:   10/30/18 1411 10/30/18 1426  BP: 126/84 125/75  Pulse: 81 76  Resp: 15 17  Temp:  (!) 36.3 C  SpO2: 100% 100%    Last Pain:  Vitals:   10/30/18 1426  TempSrc:   PainSc: 0-No pain                 Nolon Nations

## 2018-11-12 DIAGNOSIS — N2581 Secondary hyperparathyroidism of renal origin: Secondary | ICD-10-CM | POA: Diagnosis not present

## 2018-11-12 DIAGNOSIS — T8249XA Other complication of vascular dialysis catheter, initial encounter: Secondary | ICD-10-CM | POA: Diagnosis not present

## 2018-11-12 DIAGNOSIS — N186 End stage renal disease: Secondary | ICD-10-CM | POA: Diagnosis not present

## 2018-11-14 ENCOUNTER — Other Ambulatory Visit: Payer: Self-pay | Admitting: Nurse Practitioner

## 2018-11-14 DIAGNOSIS — N186 End stage renal disease: Secondary | ICD-10-CM | POA: Diagnosis not present

## 2018-11-14 DIAGNOSIS — N2581 Secondary hyperparathyroidism of renal origin: Secondary | ICD-10-CM | POA: Diagnosis not present

## 2018-11-14 DIAGNOSIS — T8249XA Other complication of vascular dialysis catheter, initial encounter: Secondary | ICD-10-CM | POA: Diagnosis not present

## 2018-11-14 NOTE — Telephone Encounter (Signed)
New Message   1) Medication(s) Requested (by name): LamoTRIgine (LAMICTAL XR) 300 MG TB24 24 hour tablet  2) Pharmacy of Choice: [facility] on [address]  3) Special Requests: Pt is requesting a refill until his appt on 11/24/2018   Approved medications will be sent to the pharmacy, we will reach out if there is an issue.  Requests made after 3pm may not be addressed until the following business day!  If a patient is unsure of the name of the medication(s) please note and ask patient to call back when they are able to provide all info, do not send to responsible party until all information is available!

## 2018-11-16 DIAGNOSIS — Z992 Dependence on renal dialysis: Secondary | ICD-10-CM | POA: Diagnosis not present

## 2018-11-16 DIAGNOSIS — N186 End stage renal disease: Secondary | ICD-10-CM | POA: Diagnosis not present

## 2018-11-16 DIAGNOSIS — I129 Hypertensive chronic kidney disease with stage 1 through stage 4 chronic kidney disease, or unspecified chronic kidney disease: Secondary | ICD-10-CM | POA: Diagnosis not present

## 2018-11-17 DIAGNOSIS — N186 End stage renal disease: Secondary | ICD-10-CM | POA: Diagnosis not present

## 2018-11-17 DIAGNOSIS — T8249XA Other complication of vascular dialysis catheter, initial encounter: Secondary | ICD-10-CM | POA: Diagnosis not present

## 2018-11-17 DIAGNOSIS — N2581 Secondary hyperparathyroidism of renal origin: Secondary | ICD-10-CM | POA: Diagnosis not present

## 2018-11-17 MED ORDER — LAMOTRIGINE ER 300 MG PO TB24: 300.0000 mg | ORAL_TABLET | Freq: Every day | ORAL | 0 refills | Status: DC

## 2018-11-19 DIAGNOSIS — N186 End stage renal disease: Secondary | ICD-10-CM | POA: Diagnosis not present

## 2018-11-19 DIAGNOSIS — T8249XA Other complication of vascular dialysis catheter, initial encounter: Secondary | ICD-10-CM | POA: Diagnosis not present

## 2018-11-19 DIAGNOSIS — N2581 Secondary hyperparathyroidism of renal origin: Secondary | ICD-10-CM | POA: Diagnosis not present

## 2018-11-21 DIAGNOSIS — N2581 Secondary hyperparathyroidism of renal origin: Secondary | ICD-10-CM | POA: Diagnosis not present

## 2018-11-21 DIAGNOSIS — N186 End stage renal disease: Secondary | ICD-10-CM | POA: Diagnosis not present

## 2018-11-21 DIAGNOSIS — T8249XA Other complication of vascular dialysis catheter, initial encounter: Secondary | ICD-10-CM | POA: Diagnosis not present

## 2018-11-24 ENCOUNTER — Other Ambulatory Visit: Payer: Self-pay

## 2018-11-24 ENCOUNTER — Encounter: Payer: Self-pay | Admitting: Family Medicine

## 2018-11-24 ENCOUNTER — Ambulatory Visit (HOSPITAL_BASED_OUTPATIENT_CLINIC_OR_DEPARTMENT_OTHER): Payer: BC Managed Care – PPO | Admitting: Family Medicine

## 2018-11-24 DIAGNOSIS — Z9114 Patient's other noncompliance with medication regimen: Secondary | ICD-10-CM

## 2018-11-24 DIAGNOSIS — N186 End stage renal disease: Secondary | ICD-10-CM | POA: Diagnosis not present

## 2018-11-24 DIAGNOSIS — T300 Burn of unspecified body region, unspecified degree: Secondary | ICD-10-CM

## 2018-11-24 DIAGNOSIS — G40909 Epilepsy, unspecified, not intractable, without status epilepticus: Secondary | ICD-10-CM

## 2018-11-24 DIAGNOSIS — Z23 Encounter for immunization: Secondary | ICD-10-CM | POA: Diagnosis not present

## 2018-11-24 DIAGNOSIS — T8249XA Other complication of vascular dialysis catheter, initial encounter: Secondary | ICD-10-CM | POA: Diagnosis not present

## 2018-11-24 DIAGNOSIS — N2581 Secondary hyperparathyroidism of renal origin: Secondary | ICD-10-CM | POA: Diagnosis not present

## 2018-11-24 MED ORDER — LAMOTRIGINE ER 300 MG PO TB24: 300.0000 mg | ORAL_TABLET | Freq: Every day | ORAL | 5 refills | Status: DC

## 2018-11-24 MED ORDER — CALCITRIOL 0.25 MCG PO CAPS: 0.2500 ug | ORAL_CAPSULE | Freq: Every day | ORAL | 2 refills | Status: DC

## 2018-11-24 MED ORDER — SILVER SULFADIAZINE 1 % EX CREA: TOPICAL_CREAM | CUTANEOUS | 1 refills | Status: DC

## 2018-11-24 MED FILL — SSD 1% CREAM: 1 | 10 days supply | Qty: 50 | Fill #0

## 2018-11-24 MED FILL — CALCITRIOL 0.25 MCG CAPS: 0.25 | 30 days supply | Qty: 30 | Fill #0

## 2018-11-24 NOTE — Progress Notes (Signed)
Med refills  Per pt he had a seizure couple days ago.   Per pt it was his fault because he was not taking his meds like he suppose to.  Burn on his left buttocks.
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
## 2018-11-26 DIAGNOSIS — N186 End stage renal disease: Secondary | ICD-10-CM | POA: Diagnosis not present

## 2018-11-26 DIAGNOSIS — N2581 Secondary hyperparathyroidism of renal origin: Secondary | ICD-10-CM | POA: Diagnosis not present

## 2018-11-26 DIAGNOSIS — T8249XA Other complication of vascular dialysis catheter, initial encounter: Secondary | ICD-10-CM | POA: Diagnosis not present

## 2018-11-26 DIAGNOSIS — Z23 Encounter for immunization: Secondary | ICD-10-CM | POA: Diagnosis not present

## 2018-11-28 DIAGNOSIS — N2581 Secondary hyperparathyroidism of renal origin: Secondary | ICD-10-CM | POA: Diagnosis not present

## 2018-11-28 DIAGNOSIS — Z23 Encounter for immunization: Secondary | ICD-10-CM | POA: Diagnosis not present

## 2018-11-28 DIAGNOSIS — N186 End stage renal disease: Secondary | ICD-10-CM | POA: Diagnosis not present

## 2018-11-28 DIAGNOSIS — T8249XA Other complication of vascular dialysis catheter, initial encounter: Secondary | ICD-10-CM | POA: Diagnosis not present

## 2018-12-01 DIAGNOSIS — T8249XA Other complication of vascular dialysis catheter, initial encounter: Secondary | ICD-10-CM | POA: Diagnosis not present

## 2018-12-01 DIAGNOSIS — N186 End stage renal disease: Secondary | ICD-10-CM | POA: Diagnosis not present

## 2018-12-01 DIAGNOSIS — N2581 Secondary hyperparathyroidism of renal origin: Secondary | ICD-10-CM | POA: Diagnosis not present

## 2018-12-03 DIAGNOSIS — T8249XA Other complication of vascular dialysis catheter, initial encounter: Secondary | ICD-10-CM | POA: Diagnosis not present

## 2018-12-03 DIAGNOSIS — N2581 Secondary hyperparathyroidism of renal origin: Secondary | ICD-10-CM | POA: Diagnosis not present

## 2018-12-03 DIAGNOSIS — N186 End stage renal disease: Secondary | ICD-10-CM | POA: Diagnosis not present

## 2018-12-05 DIAGNOSIS — T8249XA Other complication of vascular dialysis catheter, initial encounter: Secondary | ICD-10-CM | POA: Diagnosis not present

## 2018-12-05 DIAGNOSIS — N186 End stage renal disease: Secondary | ICD-10-CM | POA: Diagnosis not present

## 2018-12-05 DIAGNOSIS — N2581 Secondary hyperparathyroidism of renal origin: Secondary | ICD-10-CM | POA: Diagnosis not present

## 2018-12-08 DIAGNOSIS — T8249XA Other complication of vascular dialysis catheter, initial encounter: Secondary | ICD-10-CM | POA: Diagnosis not present

## 2018-12-08 DIAGNOSIS — N2581 Secondary hyperparathyroidism of renal origin: Secondary | ICD-10-CM | POA: Diagnosis not present

## 2018-12-08 DIAGNOSIS — N186 End stage renal disease: Secondary | ICD-10-CM | POA: Diagnosis not present

## 2018-12-10 DIAGNOSIS — T8249XA Other complication of vascular dialysis catheter, initial encounter: Secondary | ICD-10-CM | POA: Diagnosis not present

## 2018-12-10 DIAGNOSIS — N2581 Secondary hyperparathyroidism of renal origin: Secondary | ICD-10-CM | POA: Diagnosis not present

## 2018-12-10 DIAGNOSIS — N186 End stage renal disease: Secondary | ICD-10-CM | POA: Diagnosis not present

## 2018-12-12 DIAGNOSIS — N2581 Secondary hyperparathyroidism of renal origin: Secondary | ICD-10-CM | POA: Diagnosis not present

## 2018-12-12 DIAGNOSIS — T8249XA Other complication of vascular dialysis catheter, initial encounter: Secondary | ICD-10-CM | POA: Diagnosis not present

## 2018-12-12 DIAGNOSIS — N186 End stage renal disease: Secondary | ICD-10-CM | POA: Diagnosis not present

## 2018-12-15 DIAGNOSIS — N2581 Secondary hyperparathyroidism of renal origin: Secondary | ICD-10-CM | POA: Diagnosis not present

## 2018-12-15 DIAGNOSIS — T8249XA Other complication of vascular dialysis catheter, initial encounter: Secondary | ICD-10-CM | POA: Diagnosis not present

## 2018-12-15 DIAGNOSIS — N186 End stage renal disease: Secondary | ICD-10-CM | POA: Diagnosis not present

## 2018-12-16 DIAGNOSIS — I129 Hypertensive chronic kidney disease with stage 1 through stage 4 chronic kidney disease, or unspecified chronic kidney disease: Secondary | ICD-10-CM | POA: Diagnosis not present

## 2018-12-16 DIAGNOSIS — Z992 Dependence on renal dialysis: Secondary | ICD-10-CM | POA: Diagnosis not present

## 2018-12-16 DIAGNOSIS — N186 End stage renal disease: Secondary | ICD-10-CM | POA: Diagnosis not present

## 2018-12-17 DIAGNOSIS — N2581 Secondary hyperparathyroidism of renal origin: Secondary | ICD-10-CM | POA: Diagnosis not present

## 2018-12-17 DIAGNOSIS — N186 End stage renal disease: Secondary | ICD-10-CM | POA: Diagnosis not present

## 2018-12-17 DIAGNOSIS — T8249XA Other complication of vascular dialysis catheter, initial encounter: Secondary | ICD-10-CM | POA: Diagnosis not present

## 2018-12-19 DIAGNOSIS — T8249XA Other complication of vascular dialysis catheter, initial encounter: Secondary | ICD-10-CM | POA: Diagnosis not present

## 2018-12-19 DIAGNOSIS — N2581 Secondary hyperparathyroidism of renal origin: Secondary | ICD-10-CM | POA: Diagnosis not present

## 2018-12-19 DIAGNOSIS — N186 End stage renal disease: Secondary | ICD-10-CM | POA: Diagnosis not present

## 2018-12-22 DIAGNOSIS — N2581 Secondary hyperparathyroidism of renal origin: Secondary | ICD-10-CM | POA: Diagnosis not present

## 2018-12-22 DIAGNOSIS — Z23 Encounter for immunization: Secondary | ICD-10-CM | POA: Diagnosis not present

## 2018-12-22 DIAGNOSIS — N186 End stage renal disease: Secondary | ICD-10-CM | POA: Diagnosis not present

## 2018-12-22 DIAGNOSIS — T8249XA Other complication of vascular dialysis catheter, initial encounter: Secondary | ICD-10-CM | POA: Diagnosis not present

## 2018-12-24 ENCOUNTER — Other Ambulatory Visit: Payer: Self-pay | Admitting: Nurse Practitioner

## 2018-12-24 DIAGNOSIS — N2581 Secondary hyperparathyroidism of renal origin: Secondary | ICD-10-CM | POA: Diagnosis not present

## 2018-12-24 DIAGNOSIS — Z23 Encounter for immunization: Secondary | ICD-10-CM | POA: Diagnosis not present

## 2018-12-24 DIAGNOSIS — N186 End stage renal disease: Secondary | ICD-10-CM | POA: Diagnosis not present

## 2018-12-24 DIAGNOSIS — T8249XA Other complication of vascular dialysis catheter, initial encounter: Secondary | ICD-10-CM | POA: Diagnosis not present

## 2018-12-24 MED FILL — ELIQUIS 2.5 MG TABLET: 2.5 | 30 days supply | Qty: 60 | Fill #3

## 2018-12-24 MED FILL — CALCIUM ACETATE (PHOS BINDE: 667 | 30 days supply | Qty: 90 | Fill #2

## 2018-12-24 MED FILL — HYDROCHLOROTHIAZIDE 25 MG T: 25 | 30 days supply | Qty: 30 | Fill #0

## 2018-12-26 DIAGNOSIS — N186 End stage renal disease: Secondary | ICD-10-CM | POA: Diagnosis not present

## 2018-12-26 DIAGNOSIS — Z23 Encounter for immunization: Secondary | ICD-10-CM | POA: Diagnosis not present

## 2018-12-26 DIAGNOSIS — N2581 Secondary hyperparathyroidism of renal origin: Secondary | ICD-10-CM | POA: Diagnosis not present

## 2018-12-26 DIAGNOSIS — T8249XA Other complication of vascular dialysis catheter, initial encounter: Secondary | ICD-10-CM | POA: Diagnosis not present

## 2018-12-26 MED FILL — LIDOCAINE-PRILOCAINE CREAM: 2.5-2.5 | 15 days supply | Qty: 30 | Fill #0

## 2018-12-27 ENCOUNTER — Other Ambulatory Visit: Payer: Self-pay | Admitting: Nurse Practitioner

## 2018-12-27 DIAGNOSIS — G40909 Epilepsy, unspecified, not intractable, without status epilepticus: Secondary | ICD-10-CM

## 2018-12-29 DIAGNOSIS — T8249XA Other complication of vascular dialysis catheter, initial encounter: Secondary | ICD-10-CM | POA: Diagnosis not present

## 2018-12-29 DIAGNOSIS — N186 End stage renal disease: Secondary | ICD-10-CM | POA: Diagnosis not present

## 2018-12-29 DIAGNOSIS — N2581 Secondary hyperparathyroidism of renal origin: Secondary | ICD-10-CM | POA: Diagnosis not present

## 2018-12-30 ENCOUNTER — Other Ambulatory Visit: Payer: Self-pay | Admitting: Nurse Practitioner

## 2018-12-30 DIAGNOSIS — G40909 Epilepsy, unspecified, not intractable, without status epilepticus: Secondary | ICD-10-CM

## 2018-12-31 DIAGNOSIS — N2581 Secondary hyperparathyroidism of renal origin: Secondary | ICD-10-CM | POA: Diagnosis not present

## 2018-12-31 DIAGNOSIS — T8249XA Other complication of vascular dialysis catheter, initial encounter: Secondary | ICD-10-CM | POA: Diagnosis not present

## 2018-12-31 DIAGNOSIS — N186 End stage renal disease: Secondary | ICD-10-CM | POA: Diagnosis not present

## 2019-01-02 DIAGNOSIS — N186 End stage renal disease: Secondary | ICD-10-CM | POA: Diagnosis not present

## 2019-01-02 DIAGNOSIS — N2581 Secondary hyperparathyroidism of renal origin: Secondary | ICD-10-CM | POA: Diagnosis not present

## 2019-01-02 DIAGNOSIS — T8249XA Other complication of vascular dialysis catheter, initial encounter: Secondary | ICD-10-CM | POA: Diagnosis not present

## 2019-01-05 DIAGNOSIS — N2581 Secondary hyperparathyroidism of renal origin: Secondary | ICD-10-CM | POA: Diagnosis not present

## 2019-01-05 DIAGNOSIS — N186 End stage renal disease: Secondary | ICD-10-CM | POA: Diagnosis not present

## 2019-01-05 DIAGNOSIS — T8249XA Other complication of vascular dialysis catheter, initial encounter: Secondary | ICD-10-CM | POA: Diagnosis not present

## 2019-01-07 DIAGNOSIS — T8249XA Other complication of vascular dialysis catheter, initial encounter: Secondary | ICD-10-CM | POA: Diagnosis not present

## 2019-01-07 DIAGNOSIS — N2581 Secondary hyperparathyroidism of renal origin: Secondary | ICD-10-CM | POA: Diagnosis not present

## 2019-01-07 DIAGNOSIS — N186 End stage renal disease: Secondary | ICD-10-CM | POA: Diagnosis not present

## 2019-01-09 DIAGNOSIS — T8249XA Other complication of vascular dialysis catheter, initial encounter: Secondary | ICD-10-CM | POA: Diagnosis not present

## 2019-01-09 DIAGNOSIS — N186 End stage renal disease: Secondary | ICD-10-CM | POA: Diagnosis not present

## 2019-01-09 DIAGNOSIS — N2581 Secondary hyperparathyroidism of renal origin: Secondary | ICD-10-CM | POA: Diagnosis not present

## 2019-01-12 DIAGNOSIS — T8249XA Other complication of vascular dialysis catheter, initial encounter: Secondary | ICD-10-CM | POA: Diagnosis not present

## 2019-01-12 DIAGNOSIS — Z23 Encounter for immunization: Secondary | ICD-10-CM | POA: Diagnosis not present

## 2019-01-12 DIAGNOSIS — N186 End stage renal disease: Secondary | ICD-10-CM | POA: Diagnosis not present

## 2019-01-12 DIAGNOSIS — N2581 Secondary hyperparathyroidism of renal origin: Secondary | ICD-10-CM | POA: Diagnosis not present

## 2019-01-14 DIAGNOSIS — N2581 Secondary hyperparathyroidism of renal origin: Secondary | ICD-10-CM | POA: Diagnosis not present

## 2019-01-14 DIAGNOSIS — T8249XA Other complication of vascular dialysis catheter, initial encounter: Secondary | ICD-10-CM | POA: Diagnosis not present

## 2019-01-14 DIAGNOSIS — N186 End stage renal disease: Secondary | ICD-10-CM | POA: Diagnosis not present

## 2019-01-14 DIAGNOSIS — Z23 Encounter for immunization: Secondary | ICD-10-CM | POA: Diagnosis not present

## 2019-01-16 DIAGNOSIS — Z992 Dependence on renal dialysis: Secondary | ICD-10-CM | POA: Diagnosis not present

## 2019-01-16 DIAGNOSIS — N2581 Secondary hyperparathyroidism of renal origin: Secondary | ICD-10-CM | POA: Diagnosis not present

## 2019-01-16 DIAGNOSIS — T8249XA Other complication of vascular dialysis catheter, initial encounter: Secondary | ICD-10-CM | POA: Diagnosis not present

## 2019-01-16 DIAGNOSIS — Z23 Encounter for immunization: Secondary | ICD-10-CM | POA: Diagnosis not present

## 2019-01-16 DIAGNOSIS — N186 End stage renal disease: Secondary | ICD-10-CM | POA: Diagnosis not present

## 2019-01-16 DIAGNOSIS — I129 Hypertensive chronic kidney disease with stage 1 through stage 4 chronic kidney disease, or unspecified chronic kidney disease: Secondary | ICD-10-CM | POA: Diagnosis not present

## 2019-01-16 MED FILL — LIDOCAINE-PRILOCAINE CREAM: 2.5-2.5 | 15 days supply | Qty: 30 | Fill #1

## 2019-01-19 DIAGNOSIS — Z992 Dependence on renal dialysis: Secondary | ICD-10-CM | POA: Diagnosis not present

## 2019-01-19 DIAGNOSIS — N186 End stage renal disease: Secondary | ICD-10-CM | POA: Diagnosis not present

## 2019-01-19 DIAGNOSIS — T8249XA Other complication of vascular dialysis catheter, initial encounter: Secondary | ICD-10-CM | POA: Diagnosis not present

## 2019-01-19 DIAGNOSIS — N2581 Secondary hyperparathyroidism of renal origin: Secondary | ICD-10-CM | POA: Diagnosis not present

## 2019-01-21 DIAGNOSIS — T8249XA Other complication of vascular dialysis catheter, initial encounter: Secondary | ICD-10-CM | POA: Diagnosis not present

## 2019-01-21 DIAGNOSIS — N186 End stage renal disease: Secondary | ICD-10-CM | POA: Diagnosis not present

## 2019-01-21 DIAGNOSIS — Z992 Dependence on renal dialysis: Secondary | ICD-10-CM | POA: Diagnosis not present

## 2019-01-21 DIAGNOSIS — N2581 Secondary hyperparathyroidism of renal origin: Secondary | ICD-10-CM | POA: Diagnosis not present

## 2019-01-23 DIAGNOSIS — N186 End stage renal disease: Secondary | ICD-10-CM | POA: Diagnosis not present

## 2019-01-23 DIAGNOSIS — Z992 Dependence on renal dialysis: Secondary | ICD-10-CM | POA: Diagnosis not present

## 2019-01-23 DIAGNOSIS — N2581 Secondary hyperparathyroidism of renal origin: Secondary | ICD-10-CM | POA: Diagnosis not present

## 2019-01-23 DIAGNOSIS — T8249XA Other complication of vascular dialysis catheter, initial encounter: Secondary | ICD-10-CM | POA: Diagnosis not present

## 2019-01-24 ENCOUNTER — Other Ambulatory Visit: Payer: Self-pay | Admitting: Nurse Practitioner

## 2019-01-24 DIAGNOSIS — G40909 Epilepsy, unspecified, not intractable, without status epilepticus: Secondary | ICD-10-CM

## 2019-01-26 DIAGNOSIS — T8249XA Other complication of vascular dialysis catheter, initial encounter: Secondary | ICD-10-CM | POA: Diagnosis not present

## 2019-01-26 DIAGNOSIS — N186 End stage renal disease: Secondary | ICD-10-CM | POA: Diagnosis not present

## 2019-01-26 DIAGNOSIS — Z992 Dependence on renal dialysis: Secondary | ICD-10-CM | POA: Diagnosis not present

## 2019-01-26 DIAGNOSIS — N2581 Secondary hyperparathyroidism of renal origin: Secondary | ICD-10-CM | POA: Diagnosis not present

## 2019-01-28 ENCOUNTER — Other Ambulatory Visit: Payer: Self-pay | Admitting: Nurse Practitioner

## 2019-01-28 DIAGNOSIS — N2581 Secondary hyperparathyroidism of renal origin: Secondary | ICD-10-CM | POA: Diagnosis not present

## 2019-01-28 DIAGNOSIS — T8249XA Other complication of vascular dialysis catheter, initial encounter: Secondary | ICD-10-CM | POA: Diagnosis not present

## 2019-01-28 DIAGNOSIS — E782 Mixed hyperlipidemia: Secondary | ICD-10-CM

## 2019-01-28 DIAGNOSIS — N186 End stage renal disease: Secondary | ICD-10-CM | POA: Diagnosis not present

## 2019-01-28 DIAGNOSIS — Z992 Dependence on renal dialysis: Secondary | ICD-10-CM | POA: Diagnosis not present

## 2019-01-28 DIAGNOSIS — Z8673 Personal history of transient ischemic attack (TIA), and cerebral infarction without residual deficits: Secondary | ICD-10-CM

## 2019-01-28 MED FILL — LIDOCAINE-PRILOCAINE CREAM: 2.5-2.5 | 15 days supply | Qty: 30 | Fill #2

## 2019-01-28 MED FILL — CALCIUM ACETATE (PHOS BINDE: 667 | 30 days supply | Qty: 90 | Fill #3

## 2019-01-28 MED FILL — CALCITRIOL 0.25 MCG CAPS: 0.25 | 30 days supply | Qty: 30 | Fill #1

## 2019-01-28 MED FILL — HYDROCHLOROTHIAZIDE 25 MG T: 25 | 30 days supply | Qty: 30 | Fill #1

## 2019-01-30 DIAGNOSIS — N186 End stage renal disease: Secondary | ICD-10-CM | POA: Diagnosis not present

## 2019-01-30 DIAGNOSIS — N2581 Secondary hyperparathyroidism of renal origin: Secondary | ICD-10-CM | POA: Diagnosis not present

## 2019-01-30 DIAGNOSIS — T8249XA Other complication of vascular dialysis catheter, initial encounter: Secondary | ICD-10-CM | POA: Diagnosis not present

## 2019-01-30 DIAGNOSIS — Z992 Dependence on renal dialysis: Secondary | ICD-10-CM | POA: Diagnosis not present

## 2019-02-02 DIAGNOSIS — N2581 Secondary hyperparathyroidism of renal origin: Secondary | ICD-10-CM | POA: Diagnosis not present

## 2019-02-02 DIAGNOSIS — T8249XA Other complication of vascular dialysis catheter, initial encounter: Secondary | ICD-10-CM | POA: Diagnosis not present

## 2019-02-02 DIAGNOSIS — N186 End stage renal disease: Secondary | ICD-10-CM | POA: Diagnosis not present

## 2019-02-02 DIAGNOSIS — Z992 Dependence on renal dialysis: Secondary | ICD-10-CM | POA: Diagnosis not present

## 2019-02-04 DIAGNOSIS — T8249XA Other complication of vascular dialysis catheter, initial encounter: Secondary | ICD-10-CM | POA: Diagnosis not present

## 2019-02-04 DIAGNOSIS — N2581 Secondary hyperparathyroidism of renal origin: Secondary | ICD-10-CM | POA: Diagnosis not present

## 2019-02-04 DIAGNOSIS — Z992 Dependence on renal dialysis: Secondary | ICD-10-CM | POA: Diagnosis not present

## 2019-02-04 DIAGNOSIS — N186 End stage renal disease: Secondary | ICD-10-CM | POA: Diagnosis not present

## 2019-02-06 DIAGNOSIS — N2581 Secondary hyperparathyroidism of renal origin: Secondary | ICD-10-CM | POA: Diagnosis not present

## 2019-02-06 DIAGNOSIS — N186 End stage renal disease: Secondary | ICD-10-CM | POA: Diagnosis not present

## 2019-02-06 DIAGNOSIS — Z992 Dependence on renal dialysis: Secondary | ICD-10-CM | POA: Diagnosis not present

## 2019-02-06 DIAGNOSIS — T8249XA Other complication of vascular dialysis catheter, initial encounter: Secondary | ICD-10-CM | POA: Diagnosis not present

## 2019-02-06 MED FILL — ELIQUIS 2.5 MG TABLET: 2.5 | 30 days supply | Qty: 60 | Fill #0

## 2019-02-06 MED FILL — ATORVASTATIN CALCIUM 40 MG: 40 | 30 days supply | Qty: 30 | Fill #0

## 2019-02-09 ENCOUNTER — Ambulatory Visit: Payer: BC Managed Care – PPO | Admitting: Neurology

## 2019-02-09 ENCOUNTER — Telehealth: Payer: Self-pay | Admitting: Neurology

## 2019-02-09 ENCOUNTER — Encounter: Payer: Self-pay | Admitting: Neurology

## 2019-02-09 DIAGNOSIS — N186 End stage renal disease: Secondary | ICD-10-CM | POA: Diagnosis not present

## 2019-02-09 DIAGNOSIS — Z992 Dependence on renal dialysis: Secondary | ICD-10-CM | POA: Diagnosis not present

## 2019-02-09 DIAGNOSIS — I871 Compression of vein: Secondary | ICD-10-CM | POA: Diagnosis not present

## 2019-02-09 DIAGNOSIS — N2581 Secondary hyperparathyroidism of renal origin: Secondary | ICD-10-CM | POA: Diagnosis not present

## 2019-02-09 DIAGNOSIS — T82858A Stenosis of vascular prosthetic devices, implants and grafts, initial encounter: Secondary | ICD-10-CM | POA: Diagnosis not present

## 2019-02-09 DIAGNOSIS — T8249XA Other complication of vascular dialysis catheter, initial encounter: Secondary | ICD-10-CM | POA: Diagnosis not present

## 2019-02-09 NOTE — Telephone Encounter (Signed)
Pt was a no show to her apt scheduled today as a new patient with Dr Brett Fairy

## 2019-02-11 DIAGNOSIS — N186 End stage renal disease: Secondary | ICD-10-CM | POA: Diagnosis not present

## 2019-02-11 DIAGNOSIS — Z992 Dependence on renal dialysis: Secondary | ICD-10-CM | POA: Diagnosis not present

## 2019-02-11 DIAGNOSIS — T8249XA Other complication of vascular dialysis catheter, initial encounter: Secondary | ICD-10-CM | POA: Diagnosis not present

## 2019-02-11 DIAGNOSIS — N2581 Secondary hyperparathyroidism of renal origin: Secondary | ICD-10-CM | POA: Diagnosis not present

## 2019-02-13 DIAGNOSIS — N2581 Secondary hyperparathyroidism of renal origin: Secondary | ICD-10-CM | POA: Diagnosis not present

## 2019-02-13 DIAGNOSIS — Z992 Dependence on renal dialysis: Secondary | ICD-10-CM | POA: Diagnosis not present

## 2019-02-13 DIAGNOSIS — T8249XA Other complication of vascular dialysis catheter, initial encounter: Secondary | ICD-10-CM | POA: Diagnosis not present

## 2019-02-13 DIAGNOSIS — N186 End stage renal disease: Secondary | ICD-10-CM | POA: Diagnosis not present

## 2019-02-16 DIAGNOSIS — I129 Hypertensive chronic kidney disease with stage 1 through stage 4 chronic kidney disease, or unspecified chronic kidney disease: Secondary | ICD-10-CM | POA: Diagnosis not present

## 2019-02-16 DIAGNOSIS — N2581 Secondary hyperparathyroidism of renal origin: Secondary | ICD-10-CM | POA: Diagnosis not present

## 2019-02-16 DIAGNOSIS — N186 End stage renal disease: Secondary | ICD-10-CM | POA: Diagnosis not present

## 2019-02-16 DIAGNOSIS — T8249XA Other complication of vascular dialysis catheter, initial encounter: Secondary | ICD-10-CM | POA: Diagnosis not present

## 2019-02-16 DIAGNOSIS — Z992 Dependence on renal dialysis: Secondary | ICD-10-CM | POA: Diagnosis not present

## 2019-02-18 DIAGNOSIS — N186 End stage renal disease: Secondary | ICD-10-CM | POA: Diagnosis not present

## 2019-02-18 DIAGNOSIS — T8249XA Other complication of vascular dialysis catheter, initial encounter: Secondary | ICD-10-CM | POA: Diagnosis not present

## 2019-02-18 DIAGNOSIS — Z992 Dependence on renal dialysis: Secondary | ICD-10-CM | POA: Diagnosis not present

## 2019-02-18 DIAGNOSIS — N2581 Secondary hyperparathyroidism of renal origin: Secondary | ICD-10-CM | POA: Diagnosis not present

## 2019-02-19 MED FILL — LIDOCAINE-PRILOCAINE CREAM: 2.5-2.5 | 15 days supply | Qty: 30 | Fill #3

## 2019-02-20 DIAGNOSIS — Z992 Dependence on renal dialysis: Secondary | ICD-10-CM | POA: Diagnosis not present

## 2019-02-20 DIAGNOSIS — N2581 Secondary hyperparathyroidism of renal origin: Secondary | ICD-10-CM | POA: Diagnosis not present

## 2019-02-20 DIAGNOSIS — T8249XA Other complication of vascular dialysis catheter, initial encounter: Secondary | ICD-10-CM | POA: Diagnosis not present

## 2019-02-20 DIAGNOSIS — N186 End stage renal disease: Secondary | ICD-10-CM | POA: Diagnosis not present

## 2019-02-23 DIAGNOSIS — Z992 Dependence on renal dialysis: Secondary | ICD-10-CM | POA: Diagnosis not present

## 2019-02-23 DIAGNOSIS — N186 End stage renal disease: Secondary | ICD-10-CM | POA: Diagnosis not present

## 2019-02-23 DIAGNOSIS — N2581 Secondary hyperparathyroidism of renal origin: Secondary | ICD-10-CM | POA: Diagnosis not present

## 2019-02-23 DIAGNOSIS — T8249XA Other complication of vascular dialysis catheter, initial encounter: Secondary | ICD-10-CM | POA: Diagnosis not present

## 2019-02-25 DIAGNOSIS — N2581 Secondary hyperparathyroidism of renal origin: Secondary | ICD-10-CM | POA: Diagnosis not present

## 2019-02-25 DIAGNOSIS — T8249XA Other complication of vascular dialysis catheter, initial encounter: Secondary | ICD-10-CM | POA: Diagnosis not present

## 2019-02-25 DIAGNOSIS — Z992 Dependence on renal dialysis: Secondary | ICD-10-CM | POA: Diagnosis not present

## 2019-02-25 DIAGNOSIS — N186 End stage renal disease: Secondary | ICD-10-CM | POA: Diagnosis not present

## 2019-02-27 DIAGNOSIS — Z992 Dependence on renal dialysis: Secondary | ICD-10-CM | POA: Diagnosis not present

## 2019-02-27 DIAGNOSIS — N2581 Secondary hyperparathyroidism of renal origin: Secondary | ICD-10-CM | POA: Diagnosis not present

## 2019-02-27 DIAGNOSIS — N186 End stage renal disease: Secondary | ICD-10-CM | POA: Diagnosis not present

## 2019-02-27 DIAGNOSIS — T8249XA Other complication of vascular dialysis catheter, initial encounter: Secondary | ICD-10-CM | POA: Diagnosis not present

## 2019-03-02 DIAGNOSIS — N186 End stage renal disease: Secondary | ICD-10-CM | POA: Diagnosis not present

## 2019-03-02 DIAGNOSIS — N2581 Secondary hyperparathyroidism of renal origin: Secondary | ICD-10-CM | POA: Diagnosis not present

## 2019-03-02 DIAGNOSIS — Z23 Encounter for immunization: Secondary | ICD-10-CM | POA: Diagnosis not present

## 2019-03-02 DIAGNOSIS — T8249XA Other complication of vascular dialysis catheter, initial encounter: Secondary | ICD-10-CM | POA: Diagnosis not present

## 2019-03-02 DIAGNOSIS — Z992 Dependence on renal dialysis: Secondary | ICD-10-CM | POA: Diagnosis not present

## 2019-03-03 DIAGNOSIS — Z452 Encounter for adjustment and management of vascular access device: Secondary | ICD-10-CM | POA: Diagnosis not present

## 2019-03-04 DIAGNOSIS — N186 End stage renal disease: Secondary | ICD-10-CM | POA: Diagnosis not present

## 2019-03-04 DIAGNOSIS — T8249XA Other complication of vascular dialysis catheter, initial encounter: Secondary | ICD-10-CM | POA: Diagnosis not present

## 2019-03-04 DIAGNOSIS — N2581 Secondary hyperparathyroidism of renal origin: Secondary | ICD-10-CM | POA: Diagnosis not present

## 2019-03-04 DIAGNOSIS — Z23 Encounter for immunization: Secondary | ICD-10-CM | POA: Diagnosis not present

## 2019-03-04 DIAGNOSIS — Z992 Dependence on renal dialysis: Secondary | ICD-10-CM | POA: Diagnosis not present

## 2019-03-06 DIAGNOSIS — Z23 Encounter for immunization: Secondary | ICD-10-CM | POA: Diagnosis not present

## 2019-03-06 DIAGNOSIS — Z992 Dependence on renal dialysis: Secondary | ICD-10-CM | POA: Diagnosis not present

## 2019-03-06 DIAGNOSIS — N2581 Secondary hyperparathyroidism of renal origin: Secondary | ICD-10-CM | POA: Diagnosis not present

## 2019-03-06 DIAGNOSIS — T8249XA Other complication of vascular dialysis catheter, initial encounter: Secondary | ICD-10-CM | POA: Diagnosis not present

## 2019-03-06 DIAGNOSIS — N186 End stage renal disease: Secondary | ICD-10-CM | POA: Diagnosis not present

## 2019-03-06 MED FILL — LIDOCAINE-PRILOCAINE CREAM: 2.5-2.5 | 30 days supply | Qty: 60 | Fill #0

## 2019-03-09 DIAGNOSIS — N186 End stage renal disease: Secondary | ICD-10-CM | POA: Diagnosis not present

## 2019-03-09 DIAGNOSIS — Z992 Dependence on renal dialysis: Secondary | ICD-10-CM | POA: Diagnosis not present

## 2019-03-09 DIAGNOSIS — N2581 Secondary hyperparathyroidism of renal origin: Secondary | ICD-10-CM | POA: Diagnosis not present

## 2019-03-11 DIAGNOSIS — N186 End stage renal disease: Secondary | ICD-10-CM | POA: Diagnosis not present

## 2019-03-11 DIAGNOSIS — Z992 Dependence on renal dialysis: Secondary | ICD-10-CM | POA: Diagnosis not present

## 2019-03-11 DIAGNOSIS — N2581 Secondary hyperparathyroidism of renal origin: Secondary | ICD-10-CM | POA: Diagnosis not present

## 2019-03-12 DIAGNOSIS — M3214 Glomerular disease in systemic lupus erythematosus: Secondary | ICD-10-CM | POA: Diagnosis not present

## 2019-03-12 DIAGNOSIS — N186 End stage renal disease: Secondary | ICD-10-CM | POA: Diagnosis not present

## 2019-03-12 DIAGNOSIS — Z7682 Awaiting organ transplant status: Secondary | ICD-10-CM | POA: Diagnosis not present

## 2019-03-12 DIAGNOSIS — Z01818 Encounter for other preprocedural examination: Secondary | ICD-10-CM | POA: Diagnosis not present

## 2019-03-12 DIAGNOSIS — Z1159 Encounter for screening for other viral diseases: Secondary | ICD-10-CM | POA: Diagnosis not present

## 2019-03-12 DIAGNOSIS — Z992 Dependence on renal dialysis: Secondary | ICD-10-CM | POA: Diagnosis not present

## 2019-03-12 DIAGNOSIS — Z87891 Personal history of nicotine dependence: Secondary | ICD-10-CM | POA: Diagnosis not present

## 2019-03-13 DIAGNOSIS — Z992 Dependence on renal dialysis: Secondary | ICD-10-CM | POA: Diagnosis not present

## 2019-03-13 DIAGNOSIS — N186 End stage renal disease: Secondary | ICD-10-CM | POA: Diagnosis not present

## 2019-03-13 DIAGNOSIS — N2581 Secondary hyperparathyroidism of renal origin: Secondary | ICD-10-CM | POA: Diagnosis not present

## 2019-03-16 DIAGNOSIS — N2581 Secondary hyperparathyroidism of renal origin: Secondary | ICD-10-CM | POA: Diagnosis not present

## 2019-03-16 DIAGNOSIS — N186 End stage renal disease: Secondary | ICD-10-CM | POA: Diagnosis not present

## 2019-03-16 DIAGNOSIS — Z992 Dependence on renal dialysis: Secondary | ICD-10-CM | POA: Diagnosis not present

## 2019-03-16 DIAGNOSIS — Z23 Encounter for immunization: Secondary | ICD-10-CM | POA: Diagnosis not present

## 2019-03-18 DIAGNOSIS — Z992 Dependence on renal dialysis: Secondary | ICD-10-CM | POA: Diagnosis not present

## 2019-03-18 DIAGNOSIS — Z23 Encounter for immunization: Secondary | ICD-10-CM | POA: Diagnosis not present

## 2019-03-18 DIAGNOSIS — I129 Hypertensive chronic kidney disease with stage 1 through stage 4 chronic kidney disease, or unspecified chronic kidney disease: Secondary | ICD-10-CM | POA: Diagnosis not present

## 2019-03-18 DIAGNOSIS — N2581 Secondary hyperparathyroidism of renal origin: Secondary | ICD-10-CM | POA: Diagnosis not present

## 2019-03-18 DIAGNOSIS — N186 End stage renal disease: Secondary | ICD-10-CM | POA: Diagnosis not present

## 2019-03-20 DIAGNOSIS — Z992 Dependence on renal dialysis: Secondary | ICD-10-CM | POA: Diagnosis not present

## 2019-03-20 DIAGNOSIS — N186 End stage renal disease: Secondary | ICD-10-CM | POA: Diagnosis not present

## 2019-03-20 DIAGNOSIS — N2581 Secondary hyperparathyroidism of renal origin: Secondary | ICD-10-CM | POA: Diagnosis not present

## 2019-03-23 DIAGNOSIS — Z992 Dependence on renal dialysis: Secondary | ICD-10-CM | POA: Diagnosis not present

## 2019-03-23 DIAGNOSIS — N2581 Secondary hyperparathyroidism of renal origin: Secondary | ICD-10-CM | POA: Diagnosis not present

## 2019-03-23 DIAGNOSIS — N186 End stage renal disease: Secondary | ICD-10-CM | POA: Diagnosis not present

## 2019-03-25 DIAGNOSIS — N2581 Secondary hyperparathyroidism of renal origin: Secondary | ICD-10-CM | POA: Diagnosis not present

## 2019-03-25 DIAGNOSIS — Z992 Dependence on renal dialysis: Secondary | ICD-10-CM | POA: Diagnosis not present

## 2019-03-25 DIAGNOSIS — N186 End stage renal disease: Secondary | ICD-10-CM | POA: Diagnosis not present

## 2019-03-27 DIAGNOSIS — N186 End stage renal disease: Secondary | ICD-10-CM | POA: Diagnosis not present

## 2019-03-27 DIAGNOSIS — Z992 Dependence on renal dialysis: Secondary | ICD-10-CM | POA: Diagnosis not present

## 2019-03-27 DIAGNOSIS — N2581 Secondary hyperparathyroidism of renal origin: Secondary | ICD-10-CM | POA: Diagnosis not present

## 2019-03-30 DIAGNOSIS — N2581 Secondary hyperparathyroidism of renal origin: Secondary | ICD-10-CM | POA: Diagnosis not present

## 2019-03-30 DIAGNOSIS — Z992 Dependence on renal dialysis: Secondary | ICD-10-CM | POA: Diagnosis not present

## 2019-03-30 DIAGNOSIS — N186 End stage renal disease: Secondary | ICD-10-CM | POA: Diagnosis not present

## 2019-03-31 MED FILL — HYDROCHLOROTHIAZIDE 25 MG T: 25 | 30 days supply | Qty: 30 | Fill #2

## 2019-04-01 DIAGNOSIS — N2581 Secondary hyperparathyroidism of renal origin: Secondary | ICD-10-CM | POA: Diagnosis not present

## 2019-04-01 DIAGNOSIS — Z992 Dependence on renal dialysis: Secondary | ICD-10-CM | POA: Diagnosis not present

## 2019-04-01 DIAGNOSIS — N186 End stage renal disease: Secondary | ICD-10-CM | POA: Diagnosis not present

## 2019-04-03 DIAGNOSIS — N186 End stage renal disease: Secondary | ICD-10-CM | POA: Diagnosis not present

## 2019-04-03 DIAGNOSIS — Z992 Dependence on renal dialysis: Secondary | ICD-10-CM | POA: Diagnosis not present

## 2019-04-03 DIAGNOSIS — N2581 Secondary hyperparathyroidism of renal origin: Secondary | ICD-10-CM | POA: Diagnosis not present

## 2019-04-03 MED FILL — ELIQUIS 2.5 MG TABLET: 2.5 | 30 days supply | Qty: 60 | Fill #1

## 2019-04-03 MED FILL — CALCITRIOL 0.25 MCG CAPS: 0.25 | 30 days supply | Qty: 30 | Fill #2

## 2019-04-03 MED FILL — LIDOCAINE-PRILOCAINE CREAM: 2.5-2.5 | 30 days supply | Qty: 60 | Fill #1

## 2019-04-03 MED FILL — lamoTRIgine ER 300 MG TB24: 300 | 30 days supply | Qty: 30 | Fill #0

## 2019-04-06 DIAGNOSIS — N2581 Secondary hyperparathyroidism of renal origin: Secondary | ICD-10-CM | POA: Diagnosis not present

## 2019-04-06 DIAGNOSIS — Z992 Dependence on renal dialysis: Secondary | ICD-10-CM | POA: Diagnosis not present

## 2019-04-06 DIAGNOSIS — N186 End stage renal disease: Secondary | ICD-10-CM | POA: Diagnosis not present

## 2019-04-08 DIAGNOSIS — N186 End stage renal disease: Secondary | ICD-10-CM | POA: Diagnosis not present

## 2019-04-08 DIAGNOSIS — N2581 Secondary hyperparathyroidism of renal origin: Secondary | ICD-10-CM | POA: Diagnosis not present

## 2019-04-08 DIAGNOSIS — Z992 Dependence on renal dialysis: Secondary | ICD-10-CM | POA: Diagnosis not present

## 2019-04-10 DIAGNOSIS — N186 End stage renal disease: Secondary | ICD-10-CM | POA: Diagnosis not present

## 2019-04-10 DIAGNOSIS — N2581 Secondary hyperparathyroidism of renal origin: Secondary | ICD-10-CM | POA: Diagnosis not present

## 2019-04-10 DIAGNOSIS — Z992 Dependence on renal dialysis: Secondary | ICD-10-CM | POA: Diagnosis not present

## 2019-04-13 DIAGNOSIS — N186 End stage renal disease: Secondary | ICD-10-CM | POA: Diagnosis not present

## 2019-04-13 DIAGNOSIS — Z992 Dependence on renal dialysis: Secondary | ICD-10-CM | POA: Diagnosis not present

## 2019-04-13 DIAGNOSIS — N2581 Secondary hyperparathyroidism of renal origin: Secondary | ICD-10-CM | POA: Diagnosis not present

## 2019-04-14 ENCOUNTER — Emergency Department (HOSPITAL_COMMUNITY)
Admission: EM | Admit: 2019-04-14 | Discharge: 2019-04-14 | Disposition: A | Discharge status: Home / Self Care | Payer: BC Managed Care – PPO | Attending: Emergency Medicine | Admitting: Emergency Medicine

## 2019-04-14 ENCOUNTER — Encounter (HOSPITAL_COMMUNITY): Payer: Self-pay | Admitting: Emergency Medicine

## 2019-04-14 ENCOUNTER — Emergency Department (HOSPITAL_COMMUNITY): Payer: BC Managed Care – PPO

## 2019-04-14 ENCOUNTER — Other Ambulatory Visit: Payer: Self-pay

## 2019-04-14 DIAGNOSIS — Z87891 Personal history of nicotine dependence: Secondary | ICD-10-CM | POA: Insufficient documentation

## 2019-04-14 DIAGNOSIS — Z7901 Long term (current) use of anticoagulants: Secondary | ICD-10-CM | POA: Diagnosis not present

## 2019-04-14 DIAGNOSIS — Z8673 Personal history of transient ischemic attack (TIA), and cerebral infarction without residual deficits: Secondary | ICD-10-CM | POA: Diagnosis not present

## 2019-04-14 DIAGNOSIS — N186 End stage renal disease: Secondary | ICD-10-CM | POA: Insufficient documentation

## 2019-04-14 DIAGNOSIS — Y939 Activity, unspecified: Secondary | ICD-10-CM | POA: Insufficient documentation

## 2019-04-14 DIAGNOSIS — S99911A Unspecified injury of right ankle, initial encounter: Secondary | ICD-10-CM | POA: Insufficient documentation

## 2019-04-14 DIAGNOSIS — Z79899 Other long term (current) drug therapy: Secondary | ICD-10-CM | POA: Diagnosis not present

## 2019-04-14 DIAGNOSIS — X501XXA Overexertion from prolonged static or awkward postures, initial encounter: Secondary | ICD-10-CM | POA: Diagnosis not present

## 2019-04-14 DIAGNOSIS — I120 Hypertensive chronic kidney disease with stage 5 chronic kidney disease or end stage renal disease: Secondary | ICD-10-CM | POA: Diagnosis not present

## 2019-04-14 DIAGNOSIS — Y929 Unspecified place or not applicable: Secondary | ICD-10-CM | POA: Diagnosis not present

## 2019-04-14 DIAGNOSIS — Y999 Unspecified external cause status: Secondary | ICD-10-CM | POA: Insufficient documentation

## 2019-04-14 DIAGNOSIS — M79671 Pain in right foot: Secondary | ICD-10-CM | POA: Diagnosis not present

## 2019-04-14 DIAGNOSIS — M7989 Other specified soft tissue disorders: Secondary | ICD-10-CM | POA: Diagnosis not present

## 2019-04-14 NOTE — ED Provider Notes (Signed)
[place] EMERGENCY DEPARTMENT Provider Note   CSN: [id number] Arrival date & time: 04/14/19  1029     History   Chief Complaint Chief Complaint  Patient presents with  . Foot Pain    HPI [name] [name] is a 37 y.o. male presenting to the emergency department sudden onset of right foot/ankle pain that began last night.  Patient states he accidentally tripped off of the front porch and rolled his right ankle.  He did not hit his head or have any other injuries.  He had mild pain after injury though throughout the night noticed worsening of pain.  Pain is worse to the lateral aspect of his right ankle/dorsal foot that is only present with weightbearing and is mild.  He has not treated his symptoms with any medications.     The history is provided by the patient.    Past Medical History:  Diagnosis Date  . Chronic kidney disease   . Hypertension   . Lupus ([place])   . Seizures ([place])   . Seizures ([place])   . Stroke ([place])   . Stroke [facility])    64 or 38 years of age.    . Wears glasses     Patient Active Problem List   Diagnosis Date Noted  . Acute renal failure superimposed on chronic kidney disease ([place]) 09/20/2018  . ESRD (end stage renal disease) ([place]) 03/18/2018  . Prediabetes 01/10/2018  . ARF (acute renal failure) ([place]) 12/21/2017  . Chest pain in adult   . CKD (chronic kidney disease) stage 3, GFR 30-59 ml/min 10/24/2015  . Seizures ([place]) 10/24/2015  . HTN (hypertension) 01/12/2015  . Chronic anticoagulation 04/24/2011  . Lupus anticoagulant positive 04/24/2011  . OBESITY, NOS 08/15/2006  . THROMBOCYTOPENIA 08/15/2006  . TOBACCO DEPENDENCE 08/15/2006  . CVA 08/15/2006    Past Surgical History:  Procedure Laterality Date  . AV FISTULA PLACEMENT Left 12/30/2017   Procedure: ARTERIOVENOUS (AV) FISTULA CREATION VERSUS INSERTION OF ARTERIOVENOUS GRAFT LEFT ARM;  Surgeon: Rosetta Posner, MD;  Location: [place];  Service: Vascular;  Laterality: Left;  .  BASCILIC VEIN TRANSPOSITION Left 08/15/2018   Procedure: LEFT FIRST STAGE BASCILIC VEIN TRANSPOSITION;  Surgeon: Rosetta Posner, MD;  Location: [place];  Service: Vascular;  Laterality: Left;  . BASCILIC VEIN TRANSPOSITION Left 10/30/2018   Procedure: INSERTION OF GORE-TEX GRAFT LEFT UPPER ARM;  Surgeon: Waynetta Sandy, MD;  Location: [place];  Service: Vascular;  Laterality: Left;  . INSERTION OF DIALYSIS CATHETER N/A 09/20/2018   Procedure: INSERTION OF TUNNELED DIALYSIS CATHETER RIGHT INTERNAL JUGULAR;  Surgeon: Marty Heck, MD;  [facility];  Service: Vascular;  Laterality: N/A;  . RENAL BIOPSY          Home Medications    Prior to Admission medications   Medication Sig Start Date End Date Taking? Authorizing Provider  amLODipine (NORVASC) 10 MG tablet Take 1 tablet (10 mg total) by mouth daily. Patient taking differently: Take 5 mg by mouth at bedtime.  03/17/18   Gildardo Pounds, NP  atorvastatin (LIPITOR) 40 MG tablet TAKE 1 TABLET BY MOUTH DAILY. 02/06/19   Gildardo Pounds, NP  calcitRIOL (ROCALTROL) 0.25 MCG capsule Take 1 capsule (0.25 mcg total) by mouth daily. 11/24/18   Fulp, Cammie, MD  calcium acetate (PHOSLO) 667 MG capsule Take 1 capsule (667 mg total) by mouth See admin instructions. Take 667 mg with each meal and each snack Patient taking differently: Take 667-2,001 mg  by mouth daily.  09/23/18   Mikhail, Velta Addison, DO  carvedilol (COREG) 3.125 MG tablet Take 1 tablet (3.125 mg total) by mouth 2 (two) times daily with a meal. 12/26/17   Lama, Marge Duncans, MD  ELIQUIS 2.5 MG TABS tablet TAKE 1 TABLET (2.5 MG TOTAL) BY MOUTH 2 (TWO) TIMES DAILY. 02/06/19   Gildardo Pounds, NP  hydrochlorothiazide (HYDRODIURIL) 25 MG tablet TAKE 1 TABLET (25 MG TOTAL) BY MOUTH DAILY. 12/24/18   Fulp, Cammie, MD  LamoTRIgine 300 MG TB24 24 hour tablet Take 1 tablet (300 mg total) by mouth daily. No additional refills until evaluated by Neurology. 12/30/18 01/29/19  Gildardo Pounds, NP   oxyCODONE-acetaminophen (PERCOCET) 5-325 MG tablet Take 1 tablet by mouth every 6 (six) hours as needed for severe pain. Patient not taking: Reported on 11/24/2018 10/30/18   Gabriel Earing, PA-C  silver sulfADIAZINE (SILVADENE) 1 % cream Apply to affected skin areas once per day for 10 days or until area is healed 11/24/18   Antony Blackbird, MD    Family History Family History  Problem Relation Age of Onset  . Diabetes Mother   . Diabetes Maternal Grandmother     Social History Social History   Tobacco Use  . Smoking status: Former Smoker    Packs/day: 0.25    Types: Cigarettes    Quit date: 12/21/2017    Years since quitting: 1.3  . Smokeless tobacco: Never Used  Substance Use Topics  . Alcohol use: Not Currently  . Drug use: No     Allergies   Zyrtec [cetirizine]   Review of Systems Review of Systems  Musculoskeletal: Positive for arthralgias.  Skin: Negative for wound.     Physical Exam Updated Vital Signs BP (!) 159/105 (BP Location: Right Arm)   Pulse 73   Temp 98.7 F (37.1 C) (Oral)   Resp 16   Ht 6' (1.829 m)   Wt 120.2 kg   SpO2 100%   BMI 35.94 kg/m   Physical Exam Vitals signs and nursing note reviewed.  Constitutional:      General: He is not in acute distress.    Appearance: He is well-developed.  HENT:     Head: Normocephalic and atraumatic.  Eyes:     Conjunctiva/sclera: Conjunctivae normal.  Cardiovascular:     Rate and Rhythm: Normal rate.  Pulmonary:     Effort: Pulmonary effort is normal.  Musculoskeletal:     Comments: Right foot/ankle without deformity.  Mild swelling noted to the dorsum of the foot.  No bruising or wounds.  There is some slight tenderness to the dorsal lateral aspect of the proximal foot.  Normal range of motion of the ankle and foot.  Neurological:     Mental Status: He is alert.  Psychiatric:        Mood and Affect: Mood normal.        Behavior: Behavior normal.      ED Treatments / Results  Labs (all  labs ordered are listed, but only abnormal results are displayed) Labs Reviewed - No data to display  EKG None  Radiology Dg Foot Complete Right  Result Date: 04/14/2019 CLINICAL DATA:  Pain in dorsal part of foot. EXAM: RIGHT FOOT COMPLETE - 3+ VIEW COMPARISON:  None FINDINGS: There is no evidence of fracture or dislocation. Signs of plantar enthesopathy and calcaneal enthesopathy. Mild soft tissue swelling over the dorsum of the foot. IMPRESSION: Mild soft tissue swelling over the dorsum of the foot. No  underlying bony abnormality. Electronically Signed   By: Zetta Bills M.D.   On: 04/14/2019 11:23    Procedures Procedures (including critical care time)  Medications Ordered in ED Medications - No data to display   Initial Impression / Assessment and Plan / ED Course  I have reviewed the triage vital signs and the nursing notes.  Pertinent labs & imaging results that were available during my care of the patient were reviewed by me and considered in my medical decision making (see chart for details).        Patient with suspected mild ankle sprain after slipping off the front porch last night.  X-ray is negative for fracture.  Patient is in no distress.  Will place an ASO brace for support, crutches for rest.  Patient can weight-bear as tolerated, Ortho referral provided for follow-up.  Symptomatic management including RICE therapy.  Patient agreeable to plan and safe for discharge.  Discussed results, findings, treatment and follow up. Patient advised of return precautions. Patient verbalized understanding and agreed with plan.  Final Clinical Impressions(s) / ED Diagnoses   Final diagnoses:  Injury of right ankle, initial encounter    ED Discharge Orders    None       Leasa Kincannon, [place] N, PA-C 04/14/19 1438    Isla Pence, MD 04/14/19 1519

## 2019-04-14 NOTE — ED Triage Notes (Signed)
Pt reports he tripped and fell on his front porch causing pain in his right foot.

## 2019-04-14 NOTE — Discharge Instructions (Addendum)
Please read instructions below. Apply ice to your ankle for 20 minutes at a time. Elevate it as much as possible to help with swelling. You can take over-the-counter medication as needed for pain. Schedule an appointment with the orthopedic specialist in 1-2 weeks for repeat x-ray and follow-up on your injury if symptoms persist. Return to the ER for new or concerning symptoms.

## 2019-04-15 DIAGNOSIS — Z992 Dependence on renal dialysis: Secondary | ICD-10-CM | POA: Diagnosis not present

## 2019-04-15 DIAGNOSIS — N186 End stage renal disease: Secondary | ICD-10-CM | POA: Diagnosis not present

## 2019-04-15 DIAGNOSIS — N2581 Secondary hyperparathyroidism of renal origin: Secondary | ICD-10-CM | POA: Diagnosis not present

## 2019-04-17 DIAGNOSIS — N186 End stage renal disease: Secondary | ICD-10-CM | POA: Diagnosis not present

## 2019-04-17 DIAGNOSIS — Z992 Dependence on renal dialysis: Secondary | ICD-10-CM | POA: Diagnosis not present

## 2019-04-17 DIAGNOSIS — N2581 Secondary hyperparathyroidism of renal origin: Secondary | ICD-10-CM | POA: Diagnosis not present

## 2019-04-18 DIAGNOSIS — Z992 Dependence on renal dialysis: Secondary | ICD-10-CM | POA: Diagnosis not present

## 2019-04-18 DIAGNOSIS — I129 Hypertensive chronic kidney disease with stage 1 through stage 4 chronic kidney disease, or unspecified chronic kidney disease: Secondary | ICD-10-CM | POA: Diagnosis not present

## 2019-04-18 DIAGNOSIS — N186 End stage renal disease: Secondary | ICD-10-CM | POA: Diagnosis not present

## 2019-04-20 DIAGNOSIS — N186 End stage renal disease: Secondary | ICD-10-CM | POA: Diagnosis not present

## 2019-04-20 DIAGNOSIS — N2581 Secondary hyperparathyroidism of renal origin: Secondary | ICD-10-CM | POA: Diagnosis not present

## 2019-04-20 DIAGNOSIS — Z992 Dependence on renal dialysis: Secondary | ICD-10-CM | POA: Diagnosis not present

## 2019-04-22 DIAGNOSIS — Z992 Dependence on renal dialysis: Secondary | ICD-10-CM | POA: Diagnosis not present

## 2019-04-22 DIAGNOSIS — N2581 Secondary hyperparathyroidism of renal origin: Secondary | ICD-10-CM | POA: Diagnosis not present

## 2019-04-22 DIAGNOSIS — N186 End stage renal disease: Secondary | ICD-10-CM | POA: Diagnosis not present

## 2019-04-24 DIAGNOSIS — N186 End stage renal disease: Secondary | ICD-10-CM | POA: Diagnosis not present

## 2019-04-24 DIAGNOSIS — N2581 Secondary hyperparathyroidism of renal origin: Secondary | ICD-10-CM | POA: Diagnosis not present

## 2019-04-24 DIAGNOSIS — Z992 Dependence on renal dialysis: Secondary | ICD-10-CM | POA: Diagnosis not present

## 2019-04-27 DIAGNOSIS — Z992 Dependence on renal dialysis: Secondary | ICD-10-CM | POA: Diagnosis not present

## 2019-04-27 DIAGNOSIS — N186 End stage renal disease: Secondary | ICD-10-CM | POA: Diagnosis not present

## 2019-04-27 DIAGNOSIS — N2581 Secondary hyperparathyroidism of renal origin: Secondary | ICD-10-CM | POA: Diagnosis not present

## 2019-04-27 DIAGNOSIS — Z23 Encounter for immunization: Secondary | ICD-10-CM | POA: Diagnosis not present

## 2019-04-29 ENCOUNTER — Other Ambulatory Visit: Payer: Self-pay

## 2019-04-29 ENCOUNTER — Emergency Department (HOSPITAL_COMMUNITY)
Admission: EM | Admit: 2019-04-29 | Discharge: 2019-04-29 | Disposition: A | Discharge status: Home / Self Care | Payer: BC Managed Care – PPO | Attending: Emergency Medicine | Admitting: Emergency Medicine

## 2019-04-29 ENCOUNTER — Emergency Department (HOSPITAL_COMMUNITY): Payer: BC Managed Care – PPO

## 2019-04-29 DIAGNOSIS — M3210 Systemic lupus erythematosus, organ or system involvement unspecified: Secondary | ICD-10-CM | POA: Diagnosis not present

## 2019-04-29 DIAGNOSIS — Z992 Dependence on renal dialysis: Secondary | ICD-10-CM | POA: Insufficient documentation

## 2019-04-29 DIAGNOSIS — N186 End stage renal disease: Secondary | ICD-10-CM | POA: Diagnosis not present

## 2019-04-29 DIAGNOSIS — Z87891 Personal history of nicotine dependence: Secondary | ICD-10-CM | POA: Insufficient documentation

## 2019-04-29 DIAGNOSIS — Z7901 Long term (current) use of anticoagulants: Secondary | ICD-10-CM | POA: Insufficient documentation

## 2019-04-29 DIAGNOSIS — R4020 Unspecified coma: Secondary | ICD-10-CM | POA: Diagnosis not present

## 2019-04-29 DIAGNOSIS — R410 Disorientation, unspecified: Secondary | ICD-10-CM | POA: Diagnosis not present

## 2019-04-29 DIAGNOSIS — Z79899 Other long term (current) drug therapy: Secondary | ICD-10-CM | POA: Diagnosis not present

## 2019-04-29 DIAGNOSIS — I120 Hypertensive chronic kidney disease with stage 5 chronic kidney disease or end stage renal disease: Secondary | ICD-10-CM | POA: Insufficient documentation

## 2019-04-29 DIAGNOSIS — G40901 Epilepsy, unspecified, not intractable, with status epilepticus: Secondary | ICD-10-CM | POA: Diagnosis not present

## 2019-04-29 DIAGNOSIS — R58 Hemorrhage, not elsewhere classified: Secondary | ICD-10-CM | POA: Diagnosis not present

## 2019-04-29 DIAGNOSIS — Z23 Encounter for immunization: Secondary | ICD-10-CM | POA: Diagnosis not present

## 2019-04-29 DIAGNOSIS — R569 Unspecified convulsions: Secondary | ICD-10-CM

## 2019-04-29 DIAGNOSIS — N2581 Secondary hyperparathyroidism of renal origin: Secondary | ICD-10-CM | POA: Diagnosis not present

## 2019-04-29 DIAGNOSIS — G40909 Epilepsy, unspecified, not intractable, without status epilepticus: Secondary | ICD-10-CM | POA: Diagnosis not present

## 2019-04-29 LAB — CBC WITH DIFFERENTIAL/PLATELET
Abs Immature Granulocytes: 0.03 10*3/uL (ref 0.00–0.07)
Basophils Absolute: 0 10*3/uL (ref 0.0–0.1)
Basophils Relative: 1 %
Eosinophils Absolute: 0.2 10*3/uL (ref 0.0–0.5)
Eosinophils Relative: 2 %
HCT: 34.4 % — ABNORMAL LOW (ref 39.0–52.0)
Hemoglobin: 11.2 g/dL — ABNORMAL LOW (ref 13.0–17.0)
Immature Granulocytes: 0 %
Lymphocytes Relative: 10 %
Lymphs Abs: 0.8 10*3/uL (ref 0.7–4.0)
MCH: 32.3 pg (ref 26.0–34.0)
MCHC: 32.6 g/dL (ref 30.0–36.0)
MCV: 99.1 fL (ref 80.0–100.0)
Monocytes Absolute: 0.7 10*3/uL (ref 0.1–1.0)
Monocytes Relative: 8 %
Neutro Abs: 6.9 10*3/uL (ref 1.7–7.7)
Neutrophils Relative %: 79 %
Platelets: 87 10*3/uL — ABNORMAL LOW (ref 150–400)
RBC: 3.47 MIL/uL — ABNORMAL LOW (ref 4.22–5.81)
RDW: 14 % (ref 11.5–15.5)
WBC: 8.6 10*3/uL (ref 4.0–10.5)
nRBC: 0 % (ref 0.0–0.2)

## 2019-04-29 LAB — COMPREHENSIVE METABOLIC PANEL
ALT: 16 U/L (ref 0–44)
AST: 11 U/L — ABNORMAL LOW (ref 15–41)
Albumin: 3.8 g/dL (ref 3.5–5.0)
Alkaline Phosphatase: 76 U/L (ref 38–126)
Anion gap: 12 (ref 5–15)
BUN: 23 mg/dL — ABNORMAL HIGH (ref 6–20)
CO2: 31 mmol/L (ref 22–32)
Calcium: 8.8 mg/dL — ABNORMAL LOW (ref 8.9–10.3)
Chloride: 94 mmol/L — ABNORMAL LOW (ref 98–111)
Creatinine, Ser: 10.65 mg/dL — ABNORMAL HIGH (ref 0.61–1.24)
GFR calc Af Amer: 6 mL/min — ABNORMAL LOW (ref >60)
GFR calc non Af Amer: 5 mL/min — ABNORMAL LOW (ref >60)
Glucose, Bld: 99 mg/dL (ref 70–99)
Potassium: 3.8 mmol/L (ref 3.5–5.1)
Sodium: 137 mmol/L (ref 135–145)
Total Bilirubin: 0.6 mg/dL (ref 0.3–1.2)
Total Protein: 8 g/dL (ref 6.5–8.1)

## 2019-04-29 MED ORDER — LEVETIRACETAM IN NACL 1000 MG/100ML IV SOLN
1000.0000 mg | Freq: Once | INTRAVENOUS | Status: AC
  Administered 2019-04-29: 1000 mg via INTRAVENOUS
  Filled 2019-04-29: qty 100

## 2019-04-29 MED ORDER — LORAZEPAM 2 MG/ML IJ SOLN
INTRAMUSCULAR | Status: AC
  Administered 2019-04-29: 2 mg
  Filled 2019-04-29: qty 1

## 2019-04-29 NOTE — Discharge Instructions (Signed)
IT IS VERY IMPORTANT THAT YOU TAKE LAMICTAL DAILY WITHOUT MISSING DOSES.  Per [facility] statutes, patients with seizures are not allowed to drive until  they have been seizure-free for six months. Use caution when using heavy equipment or power tools. Avoid working on ladders or at heights. Take showers instead of baths. Ensure the water temperature is not too high on the home water heater. Do not go swimming alone. When caring for infants or small children, sit down when holding, feeding, or changing them to minimize risk of injury to the child in the event you have a seizure.

## 2019-04-29 NOTE — ED Provider Notes (Signed)
I received this patient in signout from Dr. Sherry Ruffing. Briefly, pt presented with seizure during dialysis, history of seizures on Lamictal and compliant.  Had witnessed seizure here and after speaking with neurology patient had been loaded with Keppra.  At time of signout, observing patient and obtaining head CT.  Head CT negative acute.  Neurology team evaluated the patient and later he admitted that he had missed a few days of his Lamictal.  They have recommended restarting the medication at his usual dose.  On reassessment several hours after dose of Versed, patient was resting comfortably, arousable and able to ambulate safely.  I discussed neurology recommendations and need to follow-up in the neurology clinic.  Discussed driving restrictions per [facility].  Extensively reviewed return precautions.  He voiced understanding was discharged in satisfactory condition.   Little, Wenda Over[place], MD 04/29/19 1949

## 2019-04-29 NOTE — ED Notes (Signed)
Patient transported to CT 

## 2019-04-29 NOTE — ED Notes (Signed)
Pt given [place] sandwich and ginger ale. States he is ready to go home.

## 2019-04-29 NOTE — ED Notes (Signed)
Pt ambulated to the restroom with no assistance.

## 2019-04-29 NOTE — ED Provider Notes (Addendum)
[name] Provider Note   CSN: [id number] Arrival date & time: 04/29/19  1118     History   Chief Complaint Chief Complaint  Patient presents with  . Seizures    HPI [name] is a 38 y.o. male.     The history is provided by the patient and medical records. The history is limited by the condition of the patient. No language interpreter was used.  Seizures Seizure activity on arrival: no (pt eventually seized in [place])   Seizure type:  Grand mal Initial focality:  None Episode characteristics: generalized shaking   Postictal symptoms: confusion and somnolence   Return to baseline: no (almost completetely)   Severity:  Moderate Progression:  Unchanged Context: medical non-compliance   Context: not alcohol withdrawal and not change in medication   Recent head injury:  No recent head injuries PTA treatment:  None History of seizures: yes     Past Medical History:  Diagnosis Date  . Chronic kidney disease   . Hypertension   . Lupus ([place])   . Seizures ([place])   . Seizures ([place])   . Stroke ([place])   . Stroke [facility])    57 or 38 years of age.    . Wears glasses     Patient Active Problem List   Diagnosis Date Noted  . Acute renal failure superimposed on chronic kidney disease ([place]) 09/20/2018  . ESRD (end stage renal disease) ([place]) 03/18/2018  . Prediabetes 01/10/2018  . ARF (acute renal failure) ([place]) 12/21/2017  . Chest pain in adult   . CKD (chronic kidney disease) stage 3, GFR 30-59 ml/min 10/24/2015  . Seizures ([place]) 10/24/2015  . HTN (hypertension) 01/12/2015  . Chronic anticoagulation 04/24/2011  . Lupus anticoagulant positive 04/24/2011  . OBESITY, NOS 08/15/2006  . THROMBOCYTOPENIA 08/15/2006  . TOBACCO DEPENDENCE 08/15/2006  . CVA 08/15/2006    Past Surgical History:  Procedure Laterality Date  . AV FISTULA PLACEMENT Left 12/30/2017   Procedure: ARTERIOVENOUS (AV) FISTULA CREATION VERSUS INSERTION OF  ARTERIOVENOUS GRAFT LEFT ARM;  Surgeon: Rosetta Posner, MD;  Location: [place];  Service: Vascular;  Laterality: Left;  . BASCILIC VEIN TRANSPOSITION Left 08/15/2018   Procedure: LEFT FIRST STAGE BASCILIC VEIN TRANSPOSITION;  Surgeon: Rosetta Posner, MD;  Location: [place];  Service: Vascular;  Laterality: Left;  . BASCILIC VEIN TRANSPOSITION Left 10/30/2018   Procedure: INSERTION OF GORE-TEX GRAFT LEFT UPPER ARM;  Surgeon: Waynetta Sandy, MD;  Location: [place];  Service: Vascular;  Laterality: Left;  . INSERTION OF DIALYSIS CATHETER N/A 09/20/2018   Procedure: INSERTION OF TUNNELED DIALYSIS CATHETER RIGHT INTERNAL JUGULAR;  Surgeon: Marty Heck, MD;  [facility];  Service: Vascular;  Laterality: N/A;  . RENAL BIOPSY          Home Medications    Prior to Admission medications   Medication Sig Start Date End Date Taking? Authorizing Provider  amLODipine (NORVASC) 10 MG tablet Take 1 tablet (10 mg total) by mouth daily. Patient taking differently: Take 5 mg by mouth at bedtime.  03/17/18   Gildardo Pounds, NP  atorvastatin (LIPITOR) 40 MG tablet TAKE 1 TABLET BY MOUTH DAILY. 02/06/19   Gildardo Pounds, NP  calcitRIOL (ROCALTROL) 0.25 MCG capsule Take 1 capsule (0.25 mcg total) by mouth daily. 11/24/18   Fulp, Cammie, MD  calcium acetate (PHOSLO) 667 MG capsule Take 1 capsule (667 mg total) by mouth See admin instructions. Take 667 mg with each  meal and each snack Patient taking differently: Take 667-2,001 mg by mouth daily.  09/23/18   Mikhail, Velta Addison, DO  carvedilol (COREG) 3.125 MG tablet Take 1 tablet (3.125 mg total) by mouth 2 (two) times daily with a meal. 12/26/17   Lama, Marge Duncans, MD  ELIQUIS 2.5 MG TABS tablet TAKE 1 TABLET (2.5 MG TOTAL) BY MOUTH 2 (TWO) TIMES DAILY. 02/06/19   Gildardo Pounds, NP  hydrochlorothiazide (HYDRODIURIL) 25 MG tablet TAKE 1 TABLET (25 MG TOTAL) BY MOUTH DAILY. 12/24/18   Fulp, Cammie, MD  LamoTRIgine 300 MG TB24 24 hour tablet Take 1 tablet (300 mg  total) by mouth daily. No additional refills until evaluated by Neurology. 12/30/18 01/29/19  Gildardo Pounds, NP  oxyCODONE-acetaminophen (PERCOCET) 5-325 MG tablet Take 1 tablet by mouth every 6 (six) hours as needed for severe pain. Patient not taking: Reported on 11/24/2018 10/30/18   Gabriel Earing, PA-C  silver sulfADIAZINE (SILVADENE) 1 % cream Apply to affected skin areas once per day for 10 days or until area is healed 11/24/18   Antony Blackbird, MD    Family History Family History  Problem Relation Age of Onset  . Diabetes Mother   . Diabetes Maternal Grandmother     Social History Social History   Tobacco Use  . Smoking status: Former Smoker    Packs/day: 0.25    Types: Cigarettes    Quit date: 12/21/2017    Years since quitting: 1.3  . Smokeless tobacco: Never Used  Substance Use Topics  . Alcohol use: Not Currently  . Drug use: No     Allergies   Zyrtec [cetirizine]   Review of Systems Review of Systems  Constitutional: Negative for chills, diaphoresis, fatigue and fever.  HENT: Negative for congestion.   Eyes: Negative for visual disturbance.  Respiratory: Negative for cough, chest tightness, shortness of breath and wheezing.   Cardiovascular: Negative for chest pain.  Gastrointestinal: Negative for abdominal pain, constipation, diarrhea, nausea and vomiting.  Genitourinary: Negative for flank pain.  Musculoskeletal: Negative for back pain.  Skin: Negative for rash and wound.  Neurological: Positive for seizures. Negative for dizziness, speech difficulty, weakness and headaches.  Psychiatric/Behavioral: Positive for confusion. Negative for agitation.  All other systems reviewed and are negative.    Physical Exam Updated Vital Signs BP (!) 150/93   Pulse 82   Temp 98.3 F (36.8 C) (Oral)   Resp 13   SpO2 98%   Physical Exam Vitals signs and nursing note reviewed.  Constitutional:      General: He is not in acute distress.    Appearance: Normal  appearance. He is well-developed. He is not ill-appearing, toxic-appearing or diaphoretic.  HENT:     Head: Normocephalic and atraumatic.     Nose: Nose normal. No congestion or rhinorrhea.     Mouth/Throat:     Mouth: Mucous membranes are moist.     Pharynx: Oropharynx is clear. No oropharyngeal exudate or posterior oropharyngeal erythema.  Eyes:     Extraocular Movements: Extraocular movements intact.     Conjunctiva/sclera: Conjunctivae normal.  Neck:     Musculoskeletal: Neck supple. No muscular tenderness.  Cardiovascular:     Rate and Rhythm: Normal rate and regular rhythm.     Heart sounds: No murmur.  Pulmonary:     Effort: Pulmonary effort is normal. No respiratory distress.     Breath sounds: Normal breath sounds. No stridor. No wheezing, rhonchi or rales.  Chest:     Chest  wall: No tenderness.  Abdominal:     General: Abdomen is flat.     Palpations: Abdomen is soft.     Tenderness: There is no abdominal tenderness.  Musculoskeletal:        General: No tenderness.  Skin:    General: Skin is warm and dry.     Capillary Refill: Capillary refill takes less than 2 seconds.     Findings: No erythema or rash.  Neurological:     General: No focal deficit present.     Mental Status: He is alert and oriented to person, place, and time.     Cranial Nerves: No cranial nerve deficit.     Sensory: No sensory deficit.     Motor: No weakness.     Coordination: Coordination normal.     Gait: Gait normal.  Psychiatric:        Mood and Affect: Mood normal.      ED Treatments / Results  Labs (all labs ordered are listed, but only abnormal results are displayed) Labs Reviewed  CBC WITH DIFFERENTIAL/PLATELET - Abnormal; Notable for the following components:      Result Value   RBC 3.47 (*)    Hemoglobin 11.2 (*)    HCT 34.4 (*)    Platelets 87 (*)    All other components within normal limits  COMPREHENSIVE METABOLIC PANEL - Abnormal; Notable for the following components:    Chloride 94 (*)    BUN 23 (*)    Creatinine, Ser 10.65 (*)    Calcium 8.8 (*)    AST 11 (*)    GFR calc non Af Amer 5 (*)    GFR calc Af Amer 6 (*)    All other components within normal limits  LAMOTRIGINE LEVEL  URINALYSIS, ROUTINE W REFLEX MICROSCOPIC  RAPID URINE DRUG SCREEN, HOSP PERFORMED    EKG None  Radiology Ct Head Wo Contrast  Result Date: 04/29/2019 CLINICAL DATA:  Altered level of consciousness. Multiple seizures today. EXAM: CT HEAD WITHOUT CONTRAST TECHNIQUE: Contiguous axial images were obtained from the base of the skull through the vertex without intravenous contrast. COMPARISON:  Most recent head CT 12/26/2016 FINDINGS: Brain: No acute hemorrhage. Left frontoparietal and right parietal encephalomalacia consistent with prior infarcts, unchanged. Remote lacunar infarct in the left caudate, unchanged. Generalized atrophy and chronic small vessel ischemia but is age advanced, but stable from prior exam. No midline shift or mass effect. No subdural or extra-axial collection. Vascular: No hyperdense vessel. Skull: No fracture or focal lesion. Sinuses/Orbits: Chronic mucosal thickening of left maxillary sinus. Mild right maxillary sinus mucosal thickening is new. No sinus fluid level. Mastoid air cells are clear. Other: None. IMPRESSION: 1. No acute intracranial abnormality. 2. Multiple remote infarcts unchanged from prior. Unchanged but age advanced atrophy and chronic small vessel ischemia. Electronically Signed   By: Keith Rake M.D.   On: 04/29/2019 16:32    Procedures Procedures (including critical care time)  CRITICAL CARE Performed by: Gwenyth Allegra Tegeler Total critical care time: 35 minutes Critical care time was exclusive of separately billable procedures and treating other patients. Critical care was necessary to treat or prevent imminent or life-threatening deterioration. Critical care was time spent personally by me on the following activities:  development of treatment plan with patient and/or surrogate as well as nursing, discussions with consultants, evaluation of patient's response to treatment, examination of patient, obtaining history from patient or surrogate, ordering and performing treatments and interventions, ordering and review of laboratory studies, ordering  and review of radiographic studies, pulse oximetry and re-evaluation of patient's condition.   Medications Ordered in ED Medications  LORazepam (ATIVAN) 2 MG/ML injection (2 mg  Given 04/29/19 1414)  levETIRAcetam (KEPPRA) IVPB 1000 mg/100 mL premix (0 mg Intravenous Stopped 04/29/19 1459)     Initial Impression / Assessment and Plan / ED Course  I have reviewed the triage vital signs and the nursing notes.  Pertinent labs & imaging results that were available during my care of the patient were reviewed by me and considered in my medical decision making (see chart for details).        EZEKIO HAYTON is a 38 y.o. male with a past medical history significant for ESRD on dialysis, obesity, hypertension, lupus anticoagulant on anticoagulation, and prior stroke who presents with seizure.  Patient reports that his last seizure was 1 month ago.  He reports that he was taking dialysis today and had a several minute seizure and he feels he is nearly back to baseline.  He simply feels slightly tired at this time.  He reports no preceding symptoms and denies any recent medication changes.  He has been taking his antiseizure medication without change.  He denies recent fevers, chills, chest pain, shortness of breath, palpitations, nausea, vomiting, urinary symptoms, or GI symptoms.  He denies any Covid contacts.  Denies recent trauma or headaches.  He reports he is feeling well now and would rather limit his work-up.  On exam, lungs clear and chest is nontender.  Abdomen is nontender.  No trauma seen.  No focal neurologic deficits.  Clear speech and symmetric smile.  Normal  sensation throughout.  Patient resting comfortably on exam.  We had a shared decision-making conversation and agreed to get some basic lab work prior to clearing him to go home.  We agreed that with lack of any headache or new neurologic complaints or trauma, will hold on any CT imaging of the head.  We also agreed to hold on loading with any medications as he has been fairly well controlled for the last month and this is the first time he had one recently.  He denies any infectious symptoms including no urinary or chest symptoms.  We will get some screening blood work.  If patient is feeling well and has no further seizures, anticipate discharge for outpatient neurology follow-up.   Patient initial blood work were returning consistent with prior however as I was going to go reassess the patient, he had another several minute seizure witnessed by a friend and then was very confused.  Patient took all his leads off and tried to elope from the emergency department.  Patient was redirected with myself, the friend, and security.  He had to receive IM Ativan to help him be redirected.  I spoke with neurology about antiseizure medications.  They requested we get a Lamictal level with would not return today but will be helpful as an outpatient.  They requested we load him with IV Keppra for 1 g.  They requested we watch him, get a UA and UDS and then observe for several hours.  If patient has no further seizures, he may be able to go home.  4:30 PM Care transferred to oncoming team while awaiting neurology official recommendations for further seizure management, CT head results, and further labs including urine.  Anticipate discharge home if no further seizures are present and work-up is otherwise reassuring.  Neurology was able to determine that patient reportedly has not been taking  all the seizure medicine as directed.  Anticipate discharge after further management.  Final Clinical Impressions(s) / ED  Diagnoses   Final diagnoses:  Seizure ([place])     Clinical Impression: 1. Seizure [facility])     Disposition: Care transferred to oncoming team while awaiting work-up results and neurology recommendations.  This note was prepared with assistance of Systems analyst. Occasional wrong-word or sound-a-like substitutions may have occurred due to the inherent limitations of voice recognition software.      Tegeler, Gwenyth Allegra, MD 04/29/19 1712    Tegeler, Gwenyth Allegra, MD 05/08/19 [phone]

## 2019-04-29 NOTE — ED Triage Notes (Signed)
Pt BIB [facility] for witnessed seizure lasting 3-5 minutes while at dialysis. Pt get dialysis MWF, only received 2.5 hrs of it today. pt incontinent of bowel and bladder. Hx of seizures, takes lamictal and is compliant. VSS, AOx4.

## 2019-04-29 NOTE — Consult Note (Addendum)
Neurology Consultation  Reason for Consult: Breakthrough seizure Referring Physician: Dr. Rex Kras  CC: Breakthrough seizure  History is obtained from: old roommate/notes   HPI: [name] is a 38 y.o. male with complex partial seizures. Was seen in 2018 at [facility] and was on 300 daily extended release at that time.  He was to see Dr. Brett Fairy on 8.24.2020 but was a no show. Patients last seizure was one month ago I can.  The only history I could get out of him due to his receiving Versed prior to CT scan and also trying to escape the hospital was that he did admit he did not take his medication over the last 2 days.  Unfortunately every time I would asked him a question he fell right back to sleep.  His roommate is in the room and gives some history.  Apparently over the past week he has told the roommate that he has not been taking his medications as he should.  Often times when he is in a rush she will forget take the medication.  Roommate can tell when he does not take the medication as he oftentimes will has a seizure and or is stressed out.  He admits that he has been stressed out recently.  I attempted to find out where he gets his medications from however he was unable to stay awake to tell me which doctor he gets it from.  He does admit that he still on 300 mg daily -looking at who prescribes his his Lamictal is Archie Patten NP who works at the [employer] and wellness center [phone].    ED course  - CT head - labs  Past Medical History:  Diagnosis Date  . Chronic kidney disease   . Hypertension   . Lupus ([place])   . Seizures ([place])   . Seizures ([place])   . Stroke ([place])   . Stroke [facility])    9 or 38 years of age.    . Wears glasses      Family History  Problem Relation Age of Onset  . Diabetes Mother   . Diabetes Maternal Grandmother    Social History:   reports that he quit smoking about 16 months ago. His smoking use included cigarettes. He smoked 0.25  packs per day. He has never used smokeless tobacco. He reports previous alcohol use. He reports that he does not use drugs.  Medications No current facility-administered medications for this encounter.   Current Outpatient Medications:  .  amLODipine (NORVASC) 10 MG tablet, Take 1 tablet (10 mg total) by mouth daily. (Patient taking differently: Take 5 mg by mouth at bedtime. ), Disp: 90 tablet, Rfl: 3 .  atorvastatin (LIPITOR) 40 MG tablet, TAKE 1 TABLET BY MOUTH DAILY., Disp: 90 tablet, Rfl: 3 .  calcitRIOL (ROCALTROL) 0.25 MCG capsule, Take 1 capsule (0.25 mcg total) by mouth daily., Disp: 30 capsule, Rfl: 2 .  calcium acetate (PHOSLO) 667 MG capsule, Take 1 capsule (667 mg total) by mouth See admin instructions. Take 667 mg with each meal and each snack (Patient taking differently: Take 667-2,001 mg by mouth daily. ), Disp: , Rfl:  .  carvedilol (COREG) 3.125 MG tablet, Take 1 tablet (3.125 mg total) by mouth 2 (two) times daily with a meal., Disp: 60 tablet, Rfl: 1 .  ELIQUIS 2.5 MG TABS tablet, TAKE 1 TABLET (2.5 MG TOTAL) BY MOUTH 2 (TWO) TIMES DAILY., Disp: 60 tablet, Rfl: 3 .  hydrochlorothiazide (HYDRODIURIL) 25 MG tablet,  TAKE 1 TABLET (25 MG TOTAL) BY MOUTH DAILY., Disp: 90 tablet, Rfl: 2 .  LamoTRIgine 300 MG TB24 24 hour tablet, Take 1 tablet (300 mg total) by mouth daily. No additional refills until evaluated by Neurology., Disp: 30 tablet, Rfl: 0 .  oxyCODONE-acetaminophen (PERCOCET) 5-325 MG tablet, Take 1 tablet by mouth every 6 (six) hours as needed for severe pain. (Patient not taking: Reported on 11/24/2018), Disp: 20 tablet, Rfl: 0 .  silver sulfADIAZINE (SILVADENE) 1 % cream, Apply to affected skin areas once per day for 10 days or until area is healed, Disp: 50 g, Rfl: 1  Exam: Current vital signs: BP 126/88   Pulse 67   Temp 98.3 F (36.8 C) (Oral)   Resp 18   SpO2 100%  Vital signs in last 24 hours: Temp:  [98.3 F (36.8 C)] 98.3 F (36.8 C) (11/11 1131) Pulse  Rate:  [64-82] 67 (11/11 1530) Resp:  [13-28] 18 (11/11 1600) BP: (126-150)/(78-99) 126/88 (11/11 1600) SpO2:  [94 %-100 %] 100 % (11/11 1530)  ROS:  Unable to obtain due to altered mental status.      Physical Exam   Constitutional: Appears well-developed and well-nourished.  Psych: very sedated Eyes: No scleral injection HENT: No OP obstrucion Head: Normocephalic.  Cardiovascular: Normal rate and regular rhythm.  Respiratory: Effort normal, non-labored breathing GI: Soft.  No distension. There is no tenderness.  Skin: WDI  Neuro: Mental Status: Patient at this time is extremely lethargic and only awakens to sternal rub.  I can get no information out of him.  He follows only simple commands such as squeezing my finger.  He falls back to sleep immediately given stimulus Cranial Nerves: Face is symmetric Blinks to threat Winces to pain bilaterally Tongue is midline.  Motor: Tone is normal. Bulk is normal. 5/5 strength was present in all four extremities. Sensory: Sensation is symmetric to light touch and temperature in the arms and legs. Deep Tendon Reflexes: 2+ and symmetric in the biceps and patellae. Plantars: Toes are downgoing bilaterally.  Cerebellar: Unable to assess secondary lethargy  Labs I have reviewed labs in epic and the results pertinent to this consultation are:   CBC    Component Value Date/Time   WBC 8.6 04/29/2019 1242   RBC 3.47 (L) 04/29/2019 1242   HGB 11.2 (L) 04/29/2019 1242   HGB 11.6 (L) 12/12/2017 1609   HGB 15.5 10/14/2013 1434   HCT 34.4 (L) 04/29/2019 1242   HCT 34.7 (L) 12/12/2017 1609   HCT 45.6 10/14/2013 1434   PLT 87 (L) 04/29/2019 1242   PLT 134 (L) 12/12/2017 1609   MCV 99.1 04/29/2019 1242   MCV 88 12/12/2017 1609   MCV 87.2 10/14/2013 1434   MCH 32.3 04/29/2019 1242   MCHC 32.6 04/29/2019 1242   RDW 14.0 04/29/2019 1242   RDW 13.2 12/12/2017 1609   RDW 13.0 10/14/2013 1434   LYMPHSABS 0.8 04/29/2019 1242    LYMPHSABS 1.4 12/12/2017 1609   LYMPHSABS 1.4 10/14/2013 1434   MONOABS 0.7 04/29/2019 1242   MONOABS 0.5 10/14/2013 1434   EOSABS 0.2 04/29/2019 1242   EOSABS 0.2 12/12/2017 1609   BASOSABS 0.0 04/29/2019 1242   BASOSABS 0.0 12/12/2017 1609   BASOSABS 0.0 10/14/2013 1434    CMP     Component Value Date/Time   NA 137 04/29/2019 1242   NA 143 12/12/2017 1609   K 3.8 04/29/2019 1242   CL 94 (L) 04/29/2019 1242   CO2 31 04/29/2019 1242  GLUCOSE 99 04/29/2019 1242   BUN 23 (H) 04/29/2019 1242   BUN 36 (H) 12/12/2017 1609   CREATININE 10.65 (H) 04/29/2019 1242   CREATININE 2.06 (H) 10/24/2015 1603   CALCIUM 8.8 (L) 04/29/2019 1242   CALCIUM 8.4 (L) 12/23/2017 0237   PROT 8.0 04/29/2019 1242   PROT 6.7 12/12/2017 1609   ALBUMIN 3.8 04/29/2019 1242   ALBUMIN 3.7 12/12/2017 1609   AST 11 (L) 04/29/2019 1242   ALT 16 04/29/2019 1242   ALKPHOS 76 04/29/2019 1242   BILITOT 0.6 04/29/2019 1242   BILITOT <0.2 12/12/2017 1609   GFRNONAA 5 (L) 04/29/2019 1242   GFRNONAA 40 (L) 10/24/2015 1603   GFRAA 6 (L) 04/29/2019 1242   GFRAA 47 (L) 10/24/2015 1603    Lipid Panel     Component Value Date/Time   CHOL 221 (H) 12/10/2016 1646   TRIG 135 12/10/2016 1646   HDL 45 12/10/2016 1646   CHOLHDL 4.9 12/10/2016 1646   LDLCALC 149 (H) 12/10/2016 1646     Imaging I have reviewed the images obtained:  CT-scan of the brain-no acute intracranial abnormalities  Etta Quill PA-C [facility] [phone]  M-F  (9:00 am- 5:00 PM)  04/29/2019, 4:34 PM     Assessment:  This is a 38 year old male who is on 300 mg Tegretol daily.  Patient admits that he has not been taking the medication as directed.  He admits that he has not taken it in 2 days.  Thus breakthrough seizure likely secondary to medication noncompliance.     Recommendations: -Once woken up start patient back on Lamictal 300 mg daily -if patent returns to baseline once versed wears off may be sent  home -Patient will need to follow-up with nurse practitioner prescribes him his medication -Per [facility] statutes, patients with seizures are not allowed to drive until  they have been seizure-free for six months. Use caution when using heavy equipment or power tools. Avoid working on ladders or at heights. Take showers instead of baths. Ensure the water temperature is not too high on the home water heater. Do not go swimming alone. When caring for infants or small children, sit down when holding, feeding, or changing them to minimize risk of injury to the child in the event you have a seizure.   Also, Maintain good sleep hygiene. Avoid alcohol.  NEUROHOSPITALIST ADDENDUM Performed a face to face diagnostic evaluation.   I have reviewed the contents of history and physical exam as documented by PA/ARNP/Resident and agree with above documentation.  I have discussed and formulated the above plan as documented. Edits to the note have been made as needed.  Breakthrough seizures in the setting of noncompliance of lamotrigine Prolonged postictal state    Seizures likely due to incomplete noncompliance with lamotrigine. Patient still somnolent on my examination, however answering questions and following simple commands.  He is antigravity in all 4 extremities.  His reflexes are brisk in the left upper and lower extremities. No further clinical seizures since Keppra load.  Would observe for few more hours returns to baseline then patient can be discharged with at least 90-day prescription of lamotrigine and outpatient neurology follow-up.  Also follow-up metabolic work-up to rule out underlying infection, UDS to look for illicit substances that could potentially lead to reduced seizure threshold.  Counseled him on no driving for 6 months.     Karena Addison Kurk Corniel MD [facility] [phone]   If 7pm to 7am, please call on call as listed  on AMION.

## 2019-05-01 DIAGNOSIS — N186 End stage renal disease: Secondary | ICD-10-CM | POA: Diagnosis not present

## 2019-05-01 DIAGNOSIS — Z992 Dependence on renal dialysis: Secondary | ICD-10-CM | POA: Diagnosis not present

## 2019-05-01 DIAGNOSIS — N2581 Secondary hyperparathyroidism of renal origin: Secondary | ICD-10-CM | POA: Diagnosis not present

## 2019-05-01 DIAGNOSIS — Z23 Encounter for immunization: Secondary | ICD-10-CM | POA: Diagnosis not present

## 2019-05-01 LAB — LAMOTRIGINE LEVEL: Lamotrigine Lvl: 4.9 ug/mL (ref 2.0–20.0)

## 2019-05-04 DIAGNOSIS — N2581 Secondary hyperparathyroidism of renal origin: Secondary | ICD-10-CM | POA: Diagnosis not present

## 2019-05-04 DIAGNOSIS — N186 End stage renal disease: Secondary | ICD-10-CM | POA: Diagnosis not present

## 2019-05-04 DIAGNOSIS — Z992 Dependence on renal dialysis: Secondary | ICD-10-CM | POA: Diagnosis not present

## 2019-05-06 DIAGNOSIS — N2581 Secondary hyperparathyroidism of renal origin: Secondary | ICD-10-CM | POA: Diagnosis not present

## 2019-05-06 DIAGNOSIS — Z992 Dependence on renal dialysis: Secondary | ICD-10-CM | POA: Diagnosis not present

## 2019-05-06 DIAGNOSIS — N186 End stage renal disease: Secondary | ICD-10-CM | POA: Diagnosis not present

## 2019-05-08 DIAGNOSIS — Z992 Dependence on renal dialysis: Secondary | ICD-10-CM | POA: Diagnosis not present

## 2019-05-08 DIAGNOSIS — N186 End stage renal disease: Secondary | ICD-10-CM | POA: Diagnosis not present

## 2019-05-08 DIAGNOSIS — N2581 Secondary hyperparathyroidism of renal origin: Secondary | ICD-10-CM | POA: Diagnosis not present

## 2019-05-10 DIAGNOSIS — N2581 Secondary hyperparathyroidism of renal origin: Secondary | ICD-10-CM | POA: Diagnosis not present

## 2019-05-10 DIAGNOSIS — Z992 Dependence on renal dialysis: Secondary | ICD-10-CM | POA: Diagnosis not present

## 2019-05-10 DIAGNOSIS — N186 End stage renal disease: Secondary | ICD-10-CM | POA: Diagnosis not present

## 2019-05-12 DIAGNOSIS — N2581 Secondary hyperparathyroidism of renal origin: Secondary | ICD-10-CM | POA: Diagnosis not present

## 2019-05-12 DIAGNOSIS — N186 End stage renal disease: Secondary | ICD-10-CM | POA: Diagnosis not present

## 2019-05-12 DIAGNOSIS — Z992 Dependence on renal dialysis: Secondary | ICD-10-CM | POA: Diagnosis not present

## 2019-05-13 ENCOUNTER — Other Ambulatory Visit: Payer: Self-pay | Admitting: Family Medicine

## 2019-05-13 DIAGNOSIS — N186 End stage renal disease: Secondary | ICD-10-CM

## 2019-05-15 DIAGNOSIS — N186 End stage renal disease: Secondary | ICD-10-CM | POA: Diagnosis not present

## 2019-05-15 DIAGNOSIS — Z992 Dependence on renal dialysis: Secondary | ICD-10-CM | POA: Diagnosis not present

## 2019-05-15 DIAGNOSIS — N2581 Secondary hyperparathyroidism of renal origin: Secondary | ICD-10-CM | POA: Diagnosis not present

## 2019-05-18 DIAGNOSIS — Z992 Dependence on renal dialysis: Secondary | ICD-10-CM | POA: Diagnosis not present

## 2019-05-18 DIAGNOSIS — I129 Hypertensive chronic kidney disease with stage 1 through stage 4 chronic kidney disease, or unspecified chronic kidney disease: Secondary | ICD-10-CM | POA: Diagnosis not present

## 2019-05-18 DIAGNOSIS — N186 End stage renal disease: Secondary | ICD-10-CM | POA: Diagnosis not present

## 2019-05-18 DIAGNOSIS — N2581 Secondary hyperparathyroidism of renal origin: Secondary | ICD-10-CM | POA: Diagnosis not present

## 2019-05-20 DIAGNOSIS — N186 End stage renal disease: Secondary | ICD-10-CM | POA: Diagnosis not present

## 2019-05-20 DIAGNOSIS — Z992 Dependence on renal dialysis: Secondary | ICD-10-CM | POA: Diagnosis not present

## 2019-05-20 DIAGNOSIS — N2581 Secondary hyperparathyroidism of renal origin: Secondary | ICD-10-CM | POA: Diagnosis not present

## 2019-05-22 DIAGNOSIS — Z992 Dependence on renal dialysis: Secondary | ICD-10-CM | POA: Diagnosis not present

## 2019-05-22 DIAGNOSIS — N186 End stage renal disease: Secondary | ICD-10-CM | POA: Diagnosis not present

## 2019-05-22 DIAGNOSIS — N2581 Secondary hyperparathyroidism of renal origin: Secondary | ICD-10-CM | POA: Diagnosis not present

## 2019-05-25 DIAGNOSIS — N186 End stage renal disease: Secondary | ICD-10-CM | POA: Diagnosis not present

## 2019-05-25 DIAGNOSIS — Z992 Dependence on renal dialysis: Secondary | ICD-10-CM | POA: Diagnosis not present

## 2019-05-25 DIAGNOSIS — N2581 Secondary hyperparathyroidism of renal origin: Secondary | ICD-10-CM | POA: Diagnosis not present

## 2019-05-27 DIAGNOSIS — N2581 Secondary hyperparathyroidism of renal origin: Secondary | ICD-10-CM | POA: Diagnosis not present

## 2019-05-27 DIAGNOSIS — Z992 Dependence on renal dialysis: Secondary | ICD-10-CM | POA: Diagnosis not present

## 2019-05-27 DIAGNOSIS — N186 End stage renal disease: Secondary | ICD-10-CM | POA: Diagnosis not present

## 2019-05-29 DIAGNOSIS — N2581 Secondary hyperparathyroidism of renal origin: Secondary | ICD-10-CM | POA: Diagnosis not present

## 2019-05-29 DIAGNOSIS — N186 End stage renal disease: Secondary | ICD-10-CM | POA: Diagnosis not present

## 2019-05-29 DIAGNOSIS — Z992 Dependence on renal dialysis: Secondary | ICD-10-CM | POA: Diagnosis not present

## 2019-06-01 DIAGNOSIS — Z992 Dependence on renal dialysis: Secondary | ICD-10-CM | POA: Diagnosis not present

## 2019-06-01 DIAGNOSIS — N2581 Secondary hyperparathyroidism of renal origin: Secondary | ICD-10-CM | POA: Diagnosis not present

## 2019-06-01 DIAGNOSIS — N186 End stage renal disease: Secondary | ICD-10-CM | POA: Diagnosis not present

## 2019-06-02 MED FILL — ELIQUIS 2.5 MG TABLET: 2.5 | 30 days supply | Qty: 60 | Fill #2

## 2019-06-02 MED FILL — ATORVASTATIN CALCIUM 40 MG: 40 | 30 days supply | Qty: 30 | Fill #1

## 2019-06-03 DIAGNOSIS — N2581 Secondary hyperparathyroidism of renal origin: Secondary | ICD-10-CM | POA: Diagnosis not present

## 2019-06-03 DIAGNOSIS — T82858A Stenosis of vascular prosthetic devices, implants and grafts, initial encounter: Secondary | ICD-10-CM | POA: Diagnosis not present

## 2019-06-03 DIAGNOSIS — N186 End stage renal disease: Secondary | ICD-10-CM | POA: Diagnosis not present

## 2019-06-03 DIAGNOSIS — I871 Compression of vein: Secondary | ICD-10-CM | POA: Diagnosis not present

## 2019-06-03 DIAGNOSIS — Z992 Dependence on renal dialysis: Secondary | ICD-10-CM | POA: Diagnosis not present

## 2019-06-05 DIAGNOSIS — N2581 Secondary hyperparathyroidism of renal origin: Secondary | ICD-10-CM | POA: Diagnosis not present

## 2019-06-05 DIAGNOSIS — N186 End stage renal disease: Secondary | ICD-10-CM | POA: Diagnosis not present

## 2019-06-05 DIAGNOSIS — Z992 Dependence on renal dialysis: Secondary | ICD-10-CM | POA: Diagnosis not present

## 2019-06-08 DIAGNOSIS — N2581 Secondary hyperparathyroidism of renal origin: Secondary | ICD-10-CM | POA: Diagnosis not present

## 2019-06-08 DIAGNOSIS — Z992 Dependence on renal dialysis: Secondary | ICD-10-CM | POA: Diagnosis not present

## 2019-06-08 DIAGNOSIS — N186 End stage renal disease: Secondary | ICD-10-CM | POA: Diagnosis not present

## 2019-06-09 MED FILL — LIDOCAINE-PRILOCAINE CREAM: 2.5-2.5 | 15 days supply | Qty: 30 | Fill #0

## 2019-06-10 ENCOUNTER — Other Ambulatory Visit: Payer: Self-pay | Admitting: Nurse Practitioner

## 2019-06-10 DIAGNOSIS — G40909 Epilepsy, unspecified, not intractable, without status epilepticus: Secondary | ICD-10-CM

## 2019-06-10 DIAGNOSIS — Z992 Dependence on renal dialysis: Secondary | ICD-10-CM | POA: Diagnosis not present

## 2019-06-10 DIAGNOSIS — N186 End stage renal disease: Secondary | ICD-10-CM | POA: Diagnosis not present

## 2019-06-10 DIAGNOSIS — N2581 Secondary hyperparathyroidism of renal origin: Secondary | ICD-10-CM | POA: Diagnosis not present

## 2019-06-13 DIAGNOSIS — Z992 Dependence on renal dialysis: Secondary | ICD-10-CM | POA: Diagnosis not present

## 2019-06-13 DIAGNOSIS — N186 End stage renal disease: Secondary | ICD-10-CM | POA: Diagnosis not present

## 2019-06-13 DIAGNOSIS — N2581 Secondary hyperparathyroidism of renal origin: Secondary | ICD-10-CM | POA: Diagnosis not present

## 2019-06-15 DIAGNOSIS — N2581 Secondary hyperparathyroidism of renal origin: Secondary | ICD-10-CM | POA: Diagnosis not present

## 2019-06-15 DIAGNOSIS — N186 End stage renal disease: Secondary | ICD-10-CM | POA: Diagnosis not present

## 2019-06-15 DIAGNOSIS — Z992 Dependence on renal dialysis: Secondary | ICD-10-CM | POA: Diagnosis not present

## 2019-06-17 DIAGNOSIS — N2581 Secondary hyperparathyroidism of renal origin: Secondary | ICD-10-CM | POA: Diagnosis not present

## 2019-06-17 DIAGNOSIS — N186 End stage renal disease: Secondary | ICD-10-CM | POA: Diagnosis not present

## 2019-06-17 DIAGNOSIS — Z992 Dependence on renal dialysis: Secondary | ICD-10-CM | POA: Diagnosis not present

## 2019-06-18 DIAGNOSIS — I129 Hypertensive chronic kidney disease with stage 1 through stage 4 chronic kidney disease, or unspecified chronic kidney disease: Secondary | ICD-10-CM | POA: Diagnosis not present

## 2019-06-18 DIAGNOSIS — N186 End stage renal disease: Secondary | ICD-10-CM | POA: Diagnosis not present

## 2019-06-18 DIAGNOSIS — Z992 Dependence on renal dialysis: Secondary | ICD-10-CM | POA: Diagnosis not present

## 2019-06-19 ENCOUNTER — Other Ambulatory Visit: Payer: Self-pay | Admitting: Nurse Practitioner

## 2019-06-19 DIAGNOSIS — G40909 Epilepsy, unspecified, not intractable, without status epilepticus: Secondary | ICD-10-CM

## 2019-06-20 DIAGNOSIS — N186 End stage renal disease: Secondary | ICD-10-CM | POA: Diagnosis not present

## 2019-06-20 DIAGNOSIS — N2581 Secondary hyperparathyroidism of renal origin: Secondary | ICD-10-CM | POA: Diagnosis not present

## 2019-06-20 DIAGNOSIS — Z992 Dependence on renal dialysis: Secondary | ICD-10-CM | POA: Diagnosis not present

## 2019-06-22 DIAGNOSIS — N186 End stage renal disease: Secondary | ICD-10-CM | POA: Diagnosis not present

## 2019-06-22 DIAGNOSIS — N2581 Secondary hyperparathyroidism of renal origin: Secondary | ICD-10-CM | POA: Diagnosis not present

## 2019-06-22 DIAGNOSIS — Z992 Dependence on renal dialysis: Secondary | ICD-10-CM | POA: Diagnosis not present

## 2019-06-22 MED FILL — LIDOCAINE-PRILOCAINE CREAM: 2.5-2.5 | 15 days supply | Qty: 30 | Fill #1

## 2019-06-22 MED FILL — lamoTRIgine ER 300 MG TB24: 300 | 30 days supply | Qty: 30 | Fill #2

## 2019-06-24 DIAGNOSIS — N2581 Secondary hyperparathyroidism of renal origin: Secondary | ICD-10-CM | POA: Diagnosis not present

## 2019-06-24 DIAGNOSIS — N186 End stage renal disease: Secondary | ICD-10-CM | POA: Diagnosis not present

## 2019-06-24 DIAGNOSIS — Z992 Dependence on renal dialysis: Secondary | ICD-10-CM | POA: Diagnosis not present

## 2019-06-26 DIAGNOSIS — N2581 Secondary hyperparathyroidism of renal origin: Secondary | ICD-10-CM | POA: Diagnosis not present

## 2019-06-26 DIAGNOSIS — N186 End stage renal disease: Secondary | ICD-10-CM | POA: Diagnosis not present

## 2019-06-26 DIAGNOSIS — Z992 Dependence on renal dialysis: Secondary | ICD-10-CM | POA: Diagnosis not present

## 2019-06-29 DIAGNOSIS — Z992 Dependence on renal dialysis: Secondary | ICD-10-CM | POA: Diagnosis not present

## 2019-06-29 DIAGNOSIS — N2581 Secondary hyperparathyroidism of renal origin: Secondary | ICD-10-CM | POA: Diagnosis not present

## 2019-06-29 DIAGNOSIS — N186 End stage renal disease: Secondary | ICD-10-CM | POA: Diagnosis not present

## 2019-07-01 DIAGNOSIS — N2581 Secondary hyperparathyroidism of renal origin: Secondary | ICD-10-CM | POA: Diagnosis not present

## 2019-07-01 DIAGNOSIS — Z992 Dependence on renal dialysis: Secondary | ICD-10-CM | POA: Diagnosis not present

## 2019-07-01 DIAGNOSIS — N186 End stage renal disease: Secondary | ICD-10-CM | POA: Diagnosis not present

## 2019-07-03 DIAGNOSIS — N2581 Secondary hyperparathyroidism of renal origin: Secondary | ICD-10-CM | POA: Diagnosis not present

## 2019-07-03 DIAGNOSIS — Z992 Dependence on renal dialysis: Secondary | ICD-10-CM | POA: Diagnosis not present

## 2019-07-03 DIAGNOSIS — N186 End stage renal disease: Secondary | ICD-10-CM | POA: Diagnosis not present

## 2019-07-05 ENCOUNTER — Other Ambulatory Visit: Payer: Self-pay

## 2019-07-05 ENCOUNTER — Encounter (HOSPITAL_COMMUNITY): Payer: Self-pay

## 2019-07-05 ENCOUNTER — Emergency Department (HOSPITAL_COMMUNITY)
Admission: EM | Admit: 2019-07-05 | Discharge: 2019-07-05 | Disposition: A | Discharge status: Home / Self Care | Payer: BC Managed Care – PPO | Attending: Emergency Medicine | Admitting: Emergency Medicine

## 2019-07-05 DIAGNOSIS — N489 Disorder of penis, unspecified: Secondary | ICD-10-CM | POA: Diagnosis not present

## 2019-07-05 DIAGNOSIS — Z5321 Procedure and treatment not carried out due to patient leaving prior to being seen by health care provider: Secondary | ICD-10-CM | POA: Insufficient documentation

## 2019-07-05 NOTE — ED Triage Notes (Signed)
Pt states, "I think that I have an STD, my pee hole looks like it is coming out." Denies discharge or pain.

## 2019-07-06 DIAGNOSIS — Z992 Dependence on renal dialysis: Secondary | ICD-10-CM | POA: Diagnosis not present

## 2019-07-06 DIAGNOSIS — N186 End stage renal disease: Secondary | ICD-10-CM | POA: Diagnosis not present

## 2019-07-06 DIAGNOSIS — N2581 Secondary hyperparathyroidism of renal origin: Secondary | ICD-10-CM | POA: Diagnosis not present

## 2019-07-07 ENCOUNTER — Encounter: Payer: Self-pay | Admitting: Nurse Practitioner

## 2019-07-07 ENCOUNTER — Other Ambulatory Visit: Payer: Self-pay

## 2019-07-07 ENCOUNTER — Ambulatory Visit: Payer: BC Managed Care – PPO | Attending: Nurse Practitioner | Admitting: Nurse Practitioner

## 2019-07-07 VITALS — BP 142/88 | HR 88 | Temp 99.0°F | Ht 73.0 in | Wt 248.0 lb

## 2019-07-07 DIAGNOSIS — D696 Thrombocytopenia, unspecified: Secondary | ICD-10-CM

## 2019-07-07 DIAGNOSIS — E782 Mixed hyperlipidemia: Secondary | ICD-10-CM

## 2019-07-07 DIAGNOSIS — R760 Raised antibody titer: Secondary | ICD-10-CM

## 2019-07-07 DIAGNOSIS — G40909 Epilepsy, unspecified, not intractable, without status epilepticus: Secondary | ICD-10-CM

## 2019-07-07 DIAGNOSIS — Z8673 Personal history of transient ischemic attack (TIA), and cerebral infarction without residual deficits: Secondary | ICD-10-CM

## 2019-07-07 DIAGNOSIS — Z23 Encounter for immunization: Secondary | ICD-10-CM | POA: Diagnosis not present

## 2019-07-07 DIAGNOSIS — R7303 Prediabetes: Secondary | ICD-10-CM | POA: Diagnosis not present

## 2019-07-07 DIAGNOSIS — I10 Essential (primary) hypertension: Secondary | ICD-10-CM

## 2019-07-07 LAB — POCT GLYCOSYLATED HEMOGLOBIN (HGB A1C): Hemoglobin A1C: 5.2 % (ref 4.0–5.6)

## 2019-07-07 LAB — GLUCOSE, POCT (MANUAL RESULT ENTRY): POC Glucose: 121 mg/dl — AB (ref 70–99)

## 2019-07-07 IMAGING — NM NM PULMONARY VENT & PERF
16 series · 16 of 16 positions shown · non-contrast
Comparison: Chest radiograph December 21, 2017

CLINICAL DATA: Chest pain.

EXAM:
NUCLEAR MEDICINE VENTILATION - PERFUSION LUNG SCAN
VIEWS:
Anterior, posterior, left lateral, right lateral, RPO, LPO, RAO,
LAO-ventilation and perfusion
RADIOPHARMACEUTICALS:  31.0 mCi of Jc-KKm DTPA aerosol inhalation
and 4.2 mCi 0c55m-YJJ IV

[Series 1: ant/post vent · 4.14mm/px · 1 of 1 slices shown (1 of 2)]
[im 1/1]
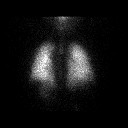

[Series 1: ant/post vent · 4.14mm/px · 1 of 1 slices shown (2 of 2)]
[im 1/1]
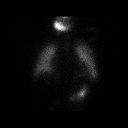

[Series 2: lao/rpo vent · 4.14mm/px · 1 of 1 slices shown (1 of 2)]
[im 1/1]
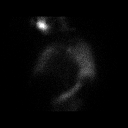

[Series 2: lao/rpo vent · 4.14mm/px · 1 of 1 slices shown (2 of 2)]
[im 1/1]
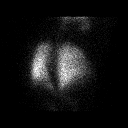

[Series 3: lpo/rao vent · 4.14mm/px · 1 of 1 slices shown (1 of 2)]
[im 1/1]
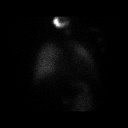

[Series 3: lpo/rao vent · 4.14mm/px · 1 of 1 slices shown (2 of 2)]
[im 1/1]
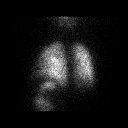

[Series 4: lt lat/rt lat vent · 4.14mm/px · 1 of 1 slices shown (1 of 2)]
[im 1/1]
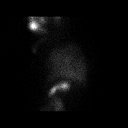

[Series 4: lt lat/rt lat vent · 4.14mm/px · 1 of 1 slices shown (2 of 2)]
[im 1/1]
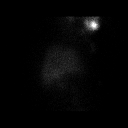

[Series 5: lt lat/rt lat perf · 4.14mm/px · 1 of 1 slices shown (1 of 2)]
[im 1/1]
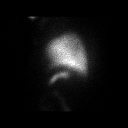

[Series 5: lt lat/rt lat perf · 4.14mm/px · 1 of 1 slices shown (2 of 2)]
[im 1/1]
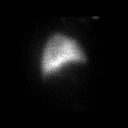

[Series 6: lpo/rao perf · 4.14mm/px · 1 of 1 slices shown (1 of 2)]
[im 1/1]
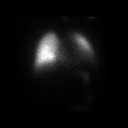

[Series 6: lpo/rao perf · 4.14mm/px · 1 of 1 slices shown (2 of 2)]
[im 1/1]
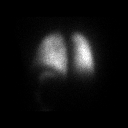

[Series 7: ant/post perf · 4.14mm/px · 1 of 1 slices shown (1 of 2)]
[im 1/1]
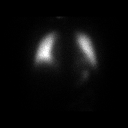

[Series 7: ant/post perf · 4.14mm/px · 1 of 1 slices shown (2 of 2)]
[im 1/1]
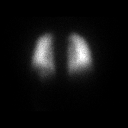

[Series 8: lao/rpo perf · 4.14mm/px · 1 of 1 slices shown (1 of 2)]
[im 1/1]
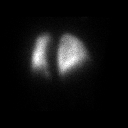

[Series 8: lao/rpo perf · 4.14mm/px · 1 of 1 slices shown (2 of 2)]
[im 1/1]
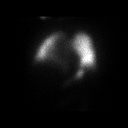

[16 of 16 positions shown; findings below may reference images not displayed]

FINDINGS: Ventilation: Radiotracer uptake is homogeneous and symmetric
bilaterally. No ventilation defects are evident.

Perfusion: Radiotracer uptake is homogeneous and symmetric
bilaterally. No perfusion defects are evident.
IMPRESSION: No appreciable ventilation or perfusion defects. This study
constitutes a very low probability of pulmonary embolus.

## 2019-07-07 MED ORDER — CARVEDILOL 3.125 MG PO TABS: 1.5620 mg | ORAL_TABLET | Freq: Two times a day (BID) | ORAL | 0 refills | Status: DC

## 2019-07-07 MED ORDER — APIXABAN 2.5 MG PO TABS: 2.5000 mg | ORAL_TABLET | Freq: Two times a day (BID) | ORAL | 1 refills | Status: DC

## 2019-07-07 MED ORDER — AMLODIPINE BESYLATE 10 MG PO TABS: 10.0000 mg | ORAL_TABLET | Freq: Every day | ORAL | 3 refills | Status: DC

## 2019-07-07 MED ORDER — ATORVASTATIN CALCIUM 40 MG PO TABS: 40.0000 mg | ORAL_TABLET | Freq: Every day | ORAL | 3 refills | Status: DC

## 2019-07-07 MED ORDER — LAMOTRIGINE ER 300 MG PO TB24: 300.0000 mg | ORAL_TABLET | Freq: Every day | ORAL | 1 refills | Status: DC

## 2019-07-07 MED ORDER — HYDROCHLOROTHIAZIDE 25 MG PO TABS: ORAL_TABLET | ORAL | 2 refills | Status: DC

## 2019-07-07 MED FILL — HYDROCHLOROTHIAZIDE 25 MG T: 25 | 30 days supply | Qty: 30 | Fill #0

## 2019-07-07 MED FILL — AMLODIPINE BESYLATE 10 MG T: 10 | 30 days supply | Qty: 30 | Fill #0

## 2019-07-07 MED FILL — ATORVASTATIN CALCIUM 40 MG: 40 | 30 days supply | Qty: 30 | Fill #0

## 2019-07-07 MED FILL — ELIQUIS 2.5 MG TABLET: 2.5 | 30 days supply | Qty: 60 | Fill #0

## 2019-07-07 MED FILL — CARVEDILOL 3.125 MG TABLET: 3.125 | 30 days supply | Qty: 30 | Fill #0

## 2019-07-07 NOTE — Progress Notes (Signed)
Assessment & Plan:  [name] [name] was seen today for follow-up.  Diagnoses and all orders for this visit:  Prediabetes -     Glucose (CBG) -     HgB A1c Continue blood sugar control as discussed in office today, low carbohydrate diet, and regular physical exercise as tolerated, 150 minutes per week (30 min each day, 5 days per week, or 50 min 3 days per week).   Seizure disorder (HCC) -     LamoTRIgine 300 MG TB24 24 hour tablet; Take 1 tablet (300 mg total) by mouth daily. No additional refills until evaluated by Neurology. -     Ambulatory referral to Neurology -     Lamotrigine level Take Lamictal as prescribed. Keep Neurology appt.   Essential hypertension -     amLODipine (NORVASC) 10 MG tablet; Take 1 tablet (10 mg total) by mouth daily. -     carvedilol (COREG) 3.125 MG tablet; Take 0.5 tablets (1.562 mg total) by mouth 2 (two) times daily with a meal. -     hydrochlorothiazide (HYDRODIURIL) 25 MG tablet; TAKE 1 TABLET (25 MG TOTAL) BY MOUTH DAILY. Continue all antihypertensives as prescribed.  Remember to bring in your blood pressure log with you for your follow up appointment.  DASH/Mediterranean Diets are healthier choices for HTN.   Mixed hyperlipidemia -     atorvastatin (LIPITOR) 40 MG tablet; Take 1 tablet (40 mg total) by mouth daily at 6 PM. -     Lipid panel INSTRUCTIONS: Work on a low fat, heart healthy diet and participate in regular aerobic exercise program by working out at least 150 minutes per week; 5 days a week-30 minutes per day. Avoid red meat/beef/steak,  fried foods. junk foods, sodas, sugary drinks, unhealthy snacking, alcohol and smoking.  Drink at least 80 oz of water per day and monitor your carbohydrate intake daily.    History of stroke -     apixaban (ELIQUIS) 2.5 MG TABS tablet; Take 1 tablet (2.5 mg total) by mouth 2 (two) times daily. TAKE 1 TABLET (2.5 MG TOTAL) BY MOUTH 2 (TWO) TIMES DAILY.  Heat rash -     Cancel: Ambulatory referral to  Dermatology  Lupus anticoagulant positive -     Lupus anticoagulant panel  Thrombocytopenia, unspecified (HCC)  Need for immunization against influenza -     Flu Vaccine QUAD 36+ mos IM    Patient has been counseled on age-appropriate routine health concerns for screening and prevention. These are reviewed and up-to-date. Referrals have been placed accordingly. Immunizations are up-to-date or declined.    Subjective:   Chief Complaint  Patient presents with  . Follow-up    Pt. is here for a follow up on HTN.    HPI [name] [name] 39 y.o. male presents to office today for follow up.  has a past medical history of Chronic kidney disease, Hypertension, Lupus ([place]), Seizures ([place]), Seizures ([place]), Stroke ([place]), and Wears glasses.   History of positive lupus anticoagulant several years ago. Will repeat. If continues positive will need to be referred to rheumatologist.   Essential Hypertension Slightly elevated today however he has not taken his blood pressure medication today. He is currently taking amlodipine 10 mg daily, HCTZ 25 mg daily and he is taking 1/2 tab of 3.125 mg BID of carvedilol. Denies chest pain, shortness of breath, palpitations, lightheadedness, dizziness, headaches or BLE edema.  BP Readings from Last 3 Encounters:  07/07/19 (!) 142/88  07/05/19 (!) 124/106  04/29/19 113/77  Seizures He is requesting a refill of Lamictal. He was referred to Neurology however he was a no show for that appointment. Last seizure was reportedly in November 2020 during his dialysis treatment. Will need to obtain lamictal level and I have instructed him that he needs to see Neurology for further refills and evaluation as he recently experienced a recurrent seizure a few months ago.   Hyperlipidemia Patient presents for follow up to hyperlipidemia. LDL not at goal of <100. He endorses medication compliance taking atorvastatin 40 mg daily as prescribed.   He is medication compliant. He  is not consistently diet compliant and denies statin intolerance including myalgias.  Lab Results  Component Value Date   CHOL 221 (H) 12/10/2016   Lab Results  Component Value Date   HDL 45 12/10/2016   Lab Results  Component Value Date   LDLCALC 149 (H) 12/10/2016   Lab Results  Component Value Date   TRIG 135 12/10/2016   Lab Results  Component Value Date   CHOLHDL 4.9 12/10/2016    Review of Systems  Constitutional: Negative for fever, malaise/fatigue and weight loss.       Endorses heat rash when he is exposed to heat  HENT: Negative.  Negative for nosebleeds.   Eyes: Negative.  Negative for blurred vision, double vision and photophobia.  Respiratory: Negative.  Negative for cough and shortness of breath.   Cardiovascular: Negative.  Negative for chest pain, palpitations and leg swelling.  Gastrointestinal: Negative.  Negative for heartburn, nausea and vomiting.  Musculoskeletal: Negative.  Negative for myalgias.  Neurological: Positive for seizures. Negative for dizziness, focal weakness and headaches.  Psychiatric/Behavioral: Negative.  Negative for suicidal ideas.    Past Medical History:  Diagnosis Date  . Chronic kidney disease   . Hypertension   . Lupus ([place])   . Seizures ([place])   . Seizures ([place])   . Stroke [facility])    57 or 39 years of age.    . Wears glasses     Past Surgical History:  Procedure Laterality Date  . AV FISTULA PLACEMENT Left 12/30/2017   Procedure: ARTERIOVENOUS (AV) FISTULA CREATION VERSUS INSERTION OF ARTERIOVENOUS GRAFT LEFT ARM;  Surgeon: Rosetta Posner, MD;  Location: [place];  Service: Vascular;  Laterality: Left;  . BASCILIC VEIN TRANSPOSITION Left 08/15/2018   Procedure: LEFT FIRST STAGE BASCILIC VEIN TRANSPOSITION;  Surgeon: Rosetta Posner, MD;  Location: [place];  Service: Vascular;  Laterality: Left;  . BASCILIC VEIN TRANSPOSITION Left 10/30/2018   Procedure: INSERTION OF GORE-TEX GRAFT LEFT UPPER ARM;  Surgeon: Waynetta Sandy, MD;  Location: [place];  Service: Vascular;  Laterality: Left;  . INSERTION OF DIALYSIS CATHETER N/A 09/20/2018   Procedure: INSERTION OF TUNNELED DIALYSIS CATHETER RIGHT INTERNAL JUGULAR;  Surgeon: Marty Heck, MD;  Location: [facility];  Service: Vascular;  Laterality: N/A;  . RENAL BIOPSY      Family History  Problem Relation Age of Onset  . Diabetes Mother   . Diabetes Maternal Grandmother     Social History Reviewed with no changes to be made today.   Outpatient Medications Prior to Visit  Medication Sig Dispense Refill  . calcitRIOL (ROCALTROL) 0.25 MCG capsule Take 1 capsule (0.25 mcg total) by mouth daily. 30 capsule 2  . calcium acetate (PHOSLO) 667 MG capsule Take 1 capsule (667 mg total) by mouth See admin instructions. Take 667 mg with each meal and each snack (Patient taking differently: Take 667-2,001 mg by mouth daily. )    .  silver sulfADIAZINE (SILVADENE) 1 % cream Apply to affected skin areas once per day for 10 days or until area is healed 50 g 1  . amLODipine (NORVASC) 10 MG tablet Take 1 tablet (10 mg total) by mouth daily. (Patient taking differently: Take 10 mg by mouth at bedtime. ) 90 tablet 3  . atorvastatin (LIPITOR) 40 MG tablet TAKE 1 TABLET BY MOUTH DAILY. (Patient taking differently: Take 40 mg by mouth daily. ) 90 tablet 3  . carvedilol (COREG) 3.125 MG tablet Take 1 tablet (3.125 mg total) by mouth 2 (two) times daily with a meal. (Patient taking differently: Take 3.125 mg by mouth daily. ) 60 tablet 1  . ELIQUIS 2.5 MG TABS tablet TAKE 1 TABLET (2.5 MG TOTAL) BY MOUTH 2 (TWO) TIMES DAILY. (Patient taking differently: Take 2.5 mg by mouth 2 (two) times daily. ) 60 tablet 3  . hydrochlorothiazide (HYDRODIURIL) 25 MG tablet TAKE 1 TABLET (25 MG TOTAL) BY MOUTH DAILY. (Patient taking differently: Take 25 mg by mouth daily. ) 90 tablet 2  . oxyCODONE-acetaminophen (PERCOCET) 5-325 MG tablet Take 1 tablet by mouth every 6 (six) hours as needed for  severe pain. (Patient not taking: Reported on 11/24/2018) 20 tablet 0  . LamoTRIgine 300 MG TB24 24 hour tablet Take 1 tablet (300 mg total) by mouth daily. No additional refills until evaluated by Neurology. 30 tablet 0   No facility-administered medications prior to visit.    Allergies  Allergen Reactions  . Zyrtec [Cetirizine] Swelling    FACIAL SWELLING, NOT CATEGORIZED       Objective:    BP (!) 142/88 (BP Location: Right Arm, Patient Position: Sitting, Cuff Size: Large)   Pulse 88   Temp 99 F (37.2 C) (Oral)   Ht 6\' 1"  (1.854 m)   Wt 248 lb (112.5 kg)   SpO2 99%   BMI 32.72 kg/m  Wt Readings from Last 3 Encounters:  07/07/19 248 lb (112.5 kg)  07/05/19 270 lb (122.5 kg)  04/14/19 265 lb (120.2 kg)    Physical Exam Vitals and nursing note reviewed.  Constitutional:      Appearance: He is well-developed.  HENT:     Head: Normocephalic and atraumatic.  Cardiovascular:     Rate and Rhythm: Normal rate and regular rhythm.     Heart sounds: Normal heart sounds. No murmur. No friction rub. No gallop.   Pulmonary:     Effort: Pulmonary effort is normal. No tachypnea or respiratory distress.     Breath sounds: Normal breath sounds. No decreased breath sounds, wheezing, rhonchi or rales.  Chest:     Chest wall: No tenderness.  Abdominal:     General: Bowel sounds are normal.     Palpations: Abdomen is soft.  Musculoskeletal:        General: Normal range of motion.     Cervical back: Normal range of motion.  Skin:    General: Skin is warm and dry.  Neurological:     Mental Status: He is alert and oriented to person, place, and time.     Coordination: Coordination normal.  Psychiatric:        Behavior: Behavior normal. Behavior is cooperative.        Thought Content: Thought content normal.        Judgment: Judgment normal.          Patient has been counseled extensively about nutrition and exercise as well as the importance of adherence with medications and  regular  follow-up. The patient was given clear instructions to go to ER or return to medical center if symptoms don't improve, worsen or new problems develop. The patient verbalized understanding.   Follow-up: Return in about 3 months (around 10/05/2019).   Gildardo Pounds, FNP-BC [facility], [place]   07/07/2019, 6:28 PM

## 2019-07-08 DIAGNOSIS — N186 End stage renal disease: Secondary | ICD-10-CM | POA: Diagnosis not present

## 2019-07-08 DIAGNOSIS — Z992 Dependence on renal dialysis: Secondary | ICD-10-CM | POA: Diagnosis not present

## 2019-07-08 DIAGNOSIS — N2581 Secondary hyperparathyroidism of renal origin: Secondary | ICD-10-CM | POA: Diagnosis not present

## 2019-07-09 LAB — DRVVT MIX: dRVVT Mix: 52.3 s — ABNORMAL HIGH (ref 0.0–40.4)

## 2019-07-09 LAB — LUPUS ANTICOAGULANT PANEL
Dilute Viper Venom Time: 70.1 s — ABNORMAL HIGH (ref 0.0–47.0)
PTT Lupus Anticoagulant: 81.1 s — ABNORMAL HIGH (ref 0.0–51.9)

## 2019-07-09 LAB — LIPID PANEL
Chol/HDL Ratio: 3 ratio (ref 0.0–5.0)
Cholesterol, Total: 143 mg/dL (ref 100–199)
HDL: 48 mg/dL (ref >39)
LDL Chol Calc (NIH): 71 mg/dL (ref 0–99)
Triglycerides: 140 mg/dL (ref 0–149)
VLDL Cholesterol Cal: 24 mg/dL (ref 5–40)

## 2019-07-09 LAB — DRVVT CONFIRM: dRVVT Confirm: 1.7 ratio — ABNORMAL HIGH (ref 0.8–1.2)

## 2019-07-09 LAB — HEXAGONAL PHASE PHOSPHOLIPID: Hexagonal Phase Phospholipid: 34 s — ABNORMAL HIGH (ref 0–11)

## 2019-07-09 LAB — PTT-LA MIX: PTT-LA Mix: 72.4 s — ABNORMAL HIGH (ref 0.0–48.9)

## 2019-07-09 LAB — LAMOTRIGINE LEVEL: Lamotrigine Lvl: 7.2 ug/mL (ref 2.0–20.0)

## 2019-07-10 DIAGNOSIS — N2581 Secondary hyperparathyroidism of renal origin: Secondary | ICD-10-CM | POA: Diagnosis not present

## 2019-07-10 DIAGNOSIS — N186 End stage renal disease: Secondary | ICD-10-CM | POA: Diagnosis not present

## 2019-07-10 DIAGNOSIS — Z992 Dependence on renal dialysis: Secondary | ICD-10-CM | POA: Diagnosis not present

## 2019-07-13 DIAGNOSIS — N186 End stage renal disease: Secondary | ICD-10-CM | POA: Diagnosis not present

## 2019-07-13 DIAGNOSIS — N2581 Secondary hyperparathyroidism of renal origin: Secondary | ICD-10-CM | POA: Diagnosis not present

## 2019-07-13 DIAGNOSIS — Z992 Dependence on renal dialysis: Secondary | ICD-10-CM | POA: Diagnosis not present

## 2019-07-13 MED FILL — LIDOCAINE-PRILOCAINE CREAM: 2.5-2.5 | 15 days supply | Qty: 30 | Fill #2

## 2019-07-15 DIAGNOSIS — N2581 Secondary hyperparathyroidism of renal origin: Secondary | ICD-10-CM | POA: Diagnosis not present

## 2019-07-15 DIAGNOSIS — Z992 Dependence on renal dialysis: Secondary | ICD-10-CM | POA: Diagnosis not present

## 2019-07-15 DIAGNOSIS — N186 End stage renal disease: Secondary | ICD-10-CM | POA: Diagnosis not present

## 2019-07-17 DIAGNOSIS — N2581 Secondary hyperparathyroidism of renal origin: Secondary | ICD-10-CM | POA: Diagnosis not present

## 2019-07-17 DIAGNOSIS — N186 End stage renal disease: Secondary | ICD-10-CM | POA: Diagnosis not present

## 2019-07-17 DIAGNOSIS — Z992 Dependence on renal dialysis: Secondary | ICD-10-CM | POA: Diagnosis not present

## 2019-07-19 DIAGNOSIS — N186 End stage renal disease: Secondary | ICD-10-CM | POA: Diagnosis not present

## 2019-07-19 DIAGNOSIS — I129 Hypertensive chronic kidney disease with stage 1 through stage 4 chronic kidney disease, or unspecified chronic kidney disease: Secondary | ICD-10-CM | POA: Diagnosis not present

## 2019-07-19 DIAGNOSIS — Z992 Dependence on renal dialysis: Secondary | ICD-10-CM | POA: Diagnosis not present

## 2019-07-20 DIAGNOSIS — N2581 Secondary hyperparathyroidism of renal origin: Secondary | ICD-10-CM | POA: Diagnosis not present

## 2019-07-20 DIAGNOSIS — Z992 Dependence on renal dialysis: Secondary | ICD-10-CM | POA: Diagnosis not present

## 2019-07-20 DIAGNOSIS — N186 End stage renal disease: Secondary | ICD-10-CM | POA: Diagnosis not present

## 2019-07-22 DIAGNOSIS — N2581 Secondary hyperparathyroidism of renal origin: Secondary | ICD-10-CM | POA: Diagnosis not present

## 2019-07-22 DIAGNOSIS — N186 End stage renal disease: Secondary | ICD-10-CM | POA: Diagnosis not present

## 2019-07-22 DIAGNOSIS — Z992 Dependence on renal dialysis: Secondary | ICD-10-CM | POA: Diagnosis not present

## 2019-07-24 DIAGNOSIS — Z992 Dependence on renal dialysis: Secondary | ICD-10-CM | POA: Diagnosis not present

## 2019-07-24 DIAGNOSIS — N186 End stage renal disease: Secondary | ICD-10-CM | POA: Diagnosis not present

## 2019-07-24 DIAGNOSIS — N2581 Secondary hyperparathyroidism of renal origin: Secondary | ICD-10-CM | POA: Diagnosis not present

## 2019-07-27 DIAGNOSIS — N186 End stage renal disease: Secondary | ICD-10-CM | POA: Diagnosis not present

## 2019-07-27 DIAGNOSIS — Z992 Dependence on renal dialysis: Secondary | ICD-10-CM | POA: Diagnosis not present

## 2019-07-27 DIAGNOSIS — N2581 Secondary hyperparathyroidism of renal origin: Secondary | ICD-10-CM | POA: Diagnosis not present

## 2019-07-27 MED FILL — LIDOCAINE-PRILOCAINE 2.5-2.: 2.5-2.5 | 15 days supply | Qty: 30 | Fill #3

## 2019-07-29 DIAGNOSIS — N186 End stage renal disease: Secondary | ICD-10-CM | POA: Diagnosis not present

## 2019-07-29 DIAGNOSIS — Z992 Dependence on renal dialysis: Secondary | ICD-10-CM | POA: Diagnosis not present

## 2019-07-29 DIAGNOSIS — N2581 Secondary hyperparathyroidism of renal origin: Secondary | ICD-10-CM | POA: Diagnosis not present

## 2019-07-29 MED FILL — lamoTRIgine ER 300 MG TB24: 300 | 30 days supply | Qty: 30 | Fill #3

## 2019-07-30 DIAGNOSIS — Z01818 Encounter for other preprocedural examination: Secondary | ICD-10-CM | POA: Diagnosis not present

## 2019-07-30 DIAGNOSIS — N189 Chronic kidney disease, unspecified: Secondary | ICD-10-CM | POA: Diagnosis not present

## 2019-07-30 DIAGNOSIS — Z01810 Encounter for preprocedural cardiovascular examination: Secondary | ICD-10-CM | POA: Diagnosis not present

## 2019-07-30 DIAGNOSIS — I708 Atherosclerosis of other arteries: Secondary | ICD-10-CM | POA: Diagnosis not present

## 2019-07-30 DIAGNOSIS — Z7682 Awaiting organ transplant status: Secondary | ICD-10-CM | POA: Diagnosis not present

## 2019-07-31 DIAGNOSIS — Z992 Dependence on renal dialysis: Secondary | ICD-10-CM | POA: Diagnosis not present

## 2019-07-31 DIAGNOSIS — N2581 Secondary hyperparathyroidism of renal origin: Secondary | ICD-10-CM | POA: Diagnosis not present

## 2019-07-31 DIAGNOSIS — N186 End stage renal disease: Secondary | ICD-10-CM | POA: Diagnosis not present

## 2019-08-03 DIAGNOSIS — N186 End stage renal disease: Secondary | ICD-10-CM | POA: Diagnosis not present

## 2019-08-03 DIAGNOSIS — N2581 Secondary hyperparathyroidism of renal origin: Secondary | ICD-10-CM | POA: Diagnosis not present

## 2019-08-03 DIAGNOSIS — Z992 Dependence on renal dialysis: Secondary | ICD-10-CM | POA: Diagnosis not present

## 2019-08-05 DIAGNOSIS — N186 End stage renal disease: Secondary | ICD-10-CM | POA: Diagnosis not present

## 2019-08-05 DIAGNOSIS — N2581 Secondary hyperparathyroidism of renal origin: Secondary | ICD-10-CM | POA: Diagnosis not present

## 2019-08-05 DIAGNOSIS — Z992 Dependence on renal dialysis: Secondary | ICD-10-CM | POA: Diagnosis not present

## 2019-08-07 DIAGNOSIS — N186 End stage renal disease: Secondary | ICD-10-CM | POA: Diagnosis not present

## 2019-08-07 DIAGNOSIS — Z992 Dependence on renal dialysis: Secondary | ICD-10-CM | POA: Diagnosis not present

## 2019-08-07 DIAGNOSIS — N2581 Secondary hyperparathyroidism of renal origin: Secondary | ICD-10-CM | POA: Diagnosis not present

## 2019-08-10 DIAGNOSIS — N186 End stage renal disease: Secondary | ICD-10-CM | POA: Diagnosis not present

## 2019-08-10 DIAGNOSIS — Z992 Dependence on renal dialysis: Secondary | ICD-10-CM | POA: Diagnosis not present

## 2019-08-10 DIAGNOSIS — N2581 Secondary hyperparathyroidism of renal origin: Secondary | ICD-10-CM | POA: Diagnosis not present

## 2019-08-12 DIAGNOSIS — N186 End stage renal disease: Secondary | ICD-10-CM | POA: Diagnosis not present

## 2019-08-12 DIAGNOSIS — Z992 Dependence on renal dialysis: Secondary | ICD-10-CM | POA: Diagnosis not present

## 2019-08-12 DIAGNOSIS — N2581 Secondary hyperparathyroidism of renal origin: Secondary | ICD-10-CM | POA: Diagnosis not present

## 2019-08-12 MED FILL — LIDOCAINE-PRILOCAINE 2.5-2.: 2.5-2.5 | 30 days supply | Qty: 60 | Fill #0

## 2019-08-14 DIAGNOSIS — Z992 Dependence on renal dialysis: Secondary | ICD-10-CM | POA: Diagnosis not present

## 2019-08-14 DIAGNOSIS — N186 End stage renal disease: Secondary | ICD-10-CM | POA: Diagnosis not present

## 2019-08-14 DIAGNOSIS — N2581 Secondary hyperparathyroidism of renal origin: Secondary | ICD-10-CM | POA: Diagnosis not present

## 2019-08-16 DIAGNOSIS — N186 End stage renal disease: Secondary | ICD-10-CM | POA: Diagnosis not present

## 2019-08-16 DIAGNOSIS — Z992 Dependence on renal dialysis: Secondary | ICD-10-CM | POA: Diagnosis not present

## 2019-08-16 DIAGNOSIS — I129 Hypertensive chronic kidney disease with stage 1 through stage 4 chronic kidney disease, or unspecified chronic kidney disease: Secondary | ICD-10-CM | POA: Diagnosis not present

## 2019-08-17 DIAGNOSIS — Z992 Dependence on renal dialysis: Secondary | ICD-10-CM | POA: Diagnosis not present

## 2019-08-17 DIAGNOSIS — T82858A Stenosis of vascular prosthetic devices, implants and grafts, initial encounter: Secondary | ICD-10-CM | POA: Diagnosis not present

## 2019-08-17 DIAGNOSIS — N186 End stage renal disease: Secondary | ICD-10-CM | POA: Diagnosis not present

## 2019-08-17 DIAGNOSIS — I871 Compression of vein: Secondary | ICD-10-CM | POA: Diagnosis not present

## 2019-08-17 DIAGNOSIS — N2581 Secondary hyperparathyroidism of renal origin: Secondary | ICD-10-CM | POA: Diagnosis not present

## 2019-08-19 DIAGNOSIS — N2581 Secondary hyperparathyroidism of renal origin: Secondary | ICD-10-CM | POA: Diagnosis not present

## 2019-08-19 DIAGNOSIS — N186 End stage renal disease: Secondary | ICD-10-CM | POA: Diagnosis not present

## 2019-08-19 DIAGNOSIS — Z992 Dependence on renal dialysis: Secondary | ICD-10-CM | POA: Diagnosis not present

## 2019-08-21 DIAGNOSIS — N2581 Secondary hyperparathyroidism of renal origin: Secondary | ICD-10-CM | POA: Diagnosis not present

## 2019-08-21 DIAGNOSIS — Z992 Dependence on renal dialysis: Secondary | ICD-10-CM | POA: Diagnosis not present

## 2019-08-21 DIAGNOSIS — N186 End stage renal disease: Secondary | ICD-10-CM | POA: Diagnosis not present

## 2019-08-24 DIAGNOSIS — N186 End stage renal disease: Secondary | ICD-10-CM | POA: Diagnosis not present

## 2019-08-24 DIAGNOSIS — Z992 Dependence on renal dialysis: Secondary | ICD-10-CM | POA: Diagnosis not present

## 2019-08-24 DIAGNOSIS — N2581 Secondary hyperparathyroidism of renal origin: Secondary | ICD-10-CM | POA: Diagnosis not present

## 2019-08-26 ENCOUNTER — Ambulatory Visit: Payer: BC Managed Care – PPO | Admitting: Neurology

## 2019-08-26 ENCOUNTER — Other Ambulatory Visit: Payer: Self-pay

## 2019-08-26 ENCOUNTER — Encounter: Payer: Self-pay | Admitting: Neurology

## 2019-08-26 VITALS — BP 135/80 | HR 88 | Temp 98.4°F | Ht 72.0 in | Wt 246.0 lb

## 2019-08-26 DIAGNOSIS — Z992 Dependence on renal dialysis: Secondary | ICD-10-CM | POA: Diagnosis not present

## 2019-08-26 DIAGNOSIS — R569 Unspecified convulsions: Secondary | ICD-10-CM | POA: Diagnosis not present

## 2019-08-26 DIAGNOSIS — I69398 Other sequelae of cerebral infarction: Secondary | ICD-10-CM | POA: Diagnosis not present

## 2019-08-26 DIAGNOSIS — G40919 Epilepsy, unspecified, intractable, without status epilepticus: Secondary | ICD-10-CM

## 2019-08-26 DIAGNOSIS — Z9114 Patient's other noncompliance with medication regimen: Secondary | ICD-10-CM | POA: Diagnosis not present

## 2019-08-26 DIAGNOSIS — N2581 Secondary hyperparathyroidism of renal origin: Secondary | ICD-10-CM | POA: Diagnosis not present

## 2019-08-26 DIAGNOSIS — N186 End stage renal disease: Secondary | ICD-10-CM | POA: Diagnosis not present

## 2019-08-26 MED ORDER — LAMOTRIGINE ER 300 MG PO TB24: 300.0000 mg | ORAL_TABLET | Freq: Every day | ORAL | 3 refills | Status: DC

## 2019-08-26 MED FILL — lamoTRIgine ER 300 MG TB24: 300 | 90 days supply | Qty: 90 | Fill #0

## 2019-08-26 NOTE — Patient Instructions (Signed)
Per [place] DMV statutes, patients with seizures are not allowed to drive until they have been seizure-free for six months.    Use caution when using heavy equipment or power tools. Avoid working on ladders or at heights. Take showers instead of baths. Ensure the water temperature is not too high on the home water heater. Do not go swimming alone. Do not lock yourself in a room alone (i.e. bathroom). When caring for infants or small children, sit down when holding, feeding, or changing them to minimize risk of injury to the child in the event you have a seizure. Maintain good sleep hygiene. Avoid alcohol.    If patient has another seizure, call 911 and bring them back to the ED if: A.  The seizure lasts longer than 5 minutes.      B.  The patient doesn't wake shortly after the seizure or has new problems such as difficulty seeing, speaking or moving following the seizure C.  The patient was injured during the seizure D.  The patient has a temperature over 102 F (39C) E.  The patient vomited during the seizure and now is having trouble breathing  Per [place] DMV statutes, patients with seizures are not allowed to drive until they have been seizure-free for six months.  Other recommendations include using caution when using heavy equipment or power tools. Avoid working on ladders or at heights. Take showers instead of baths.  Do not swim alone.  Ensure the water temperature is not too high on the home water heater. Do not go swimming alone. Do not lock yourself in a room alone (i.e. bathroom). When caring for infants or small children, sit down when holding, feeding, or changing them to minimize risk of injury to the child in the event you have a seizure. Maintain good sleep hygiene. Avoid alcohol.  Also recommend adequate sleep, hydration, good diet and minimize stress.  During the Seizure  - First, ensure adequate ventilation and place patients on the floor on their left side  Loosen  clothing around the neck and ensure the airway is patent. If the patient is clenching the teeth, do not force the mouth open with any object as this can cause severe damage - Remove all items from the surrounding that can be hazardous. The patient may be oblivious to what's happening and may not even know what he or she is doing. If the patient is confused and wandering, either gently guide him/her away and block access to outside areas - Reassure the individual and be comforting - Call 911. In most cases, the seizure ends before EMS arrives. However, there are cases when seizures may last over 3 to 5 minutes. Or the individual may have developed breathing difficulties or severe injuries. If a pregnant patient or a person with diabetes develops a seizure, it is prudent to call an ambulance. - Finally, if the patient does not regain full consciousness, then call EMS. Most patients will remain confused for about 45 to 90 minutes after a seizure, so you must use judgment in calling for help. - Avoid restraints but make sure the patient is in a bed with padded side rails - Place the individual in a lateral position with the neck slightly flexed; this will help the saliva drain from the mouth and prevent the tongue from falling backward - Remove all nearby furniture and other hazards from the area - Provide verbal assurance as the individual is regaining consciousness - Provide the patient with privacy   if possible - Call for help and start treatment as ordered by the caregiver   fter the Seizure (Postictal Stage)  After a seizure, most patients experience confusion, fatigue, muscle pain and/or a headache. Thus, one should permit the individual to sleep. For the next few days, reassurance is essential. Being calm and helping reorient the person is also of importance.  Most seizures are painless and end spontaneously. Seizures are not harmful to others but can lead to complications such as stress on the  lungs, brain and the heart. Individuals with prior lung problems may develop labored breathing and respiratory distress.   

## 2019-08-26 NOTE — Progress Notes (Signed)
[facility]    Provider:  Dr Jaynee Eagles Requesting Provider: Gildardo Pounds, NP Primary Care Provider:  Gildardo Pounds, NP  CC:  seizure  HPI:  [name] [name] is a 39 y.o. male here as requested by Gildardo Pounds, NP for seizures, strokes now on chronic anticoagulation for anticoagulation positive, breakthrough seizure in the setting of noncompliance with his Lamictal. PMHx  hypertension, lupus, seizures, stroke (65 for 39 years of age), hypertension, chronic anticoagulation, lupus anticoagulant positive, obesity, tobacco dependence, end-stage renal disease on dialysis, prediabetes.  I reviewed emergency notes from November 2020: Patient had a witnessed seizure lasting 3-5 minutes while at dialysis, he has a hx of seizures and take lamictal.  There is no seizure activity on arrival but patient did seize in the emergency room, generalized tonic-clonic, no initial focality, generalized shaking, confusion and somnolence, moderate severity, in the context of medical noncompliance, no change in medication no recent head injuries.  At that time he reported his last seizure was 1 month prior, he says he has been taking his antiseizure medication without change(later admitted he missed doses).  Patient had a witnessed seizure in the emergency room and after speaking with neurology he was loaded with Keppra, CT of the head was negative, patient later admitted he had missed a few days of his Lamictal.  He is here alone. The first time he had a seizure was 8 years ago, his roommate heard him choking and witnessed a seizure. If he misses his medication he has a breakthrough seizure, but if taking every day is well controlled. Last seizures was in November 04/29/2019. We discussed driving restrictions. He reports he had a workup including MRI brain and eeg, its been many years. He does know the seizures are coming. No known family history. No significant history of head trauma. Never had  seizures as a child. Described as generalized shaking, tongue biting, he has urinated and defecated. No status epilepticus, never been intubated. Last a minute. He endorses compliance with medication. 15 years ago had a stroke, he was in the hospital for a month, due to drug use and cocaine use. No other focal neurologic deficits, associated symptoms, inciting events or modifiable factors.  Reviewed notes, labs and imaging from outside physicians, which showed:  CT head 04/29/2019: Brain: No acute hemorrhage. Left frontoparietal and right parietal encephalomalacia consistent with prior infarcts, unchanged. Remote lacunar infarct in the left caudate, unchanged. Generalized atrophy and chronic small vessel ischemia but is age advanced, but stable from prior exam. No midline shift or mass effect. No subdural or extra-axial collection. (personally reviewed images (and with patient) and agree with the above)  Vascular: No hyperdense vessel.  Skull: No fracture or focal lesion.  Sinuses/Orbits: Chronic mucosal thickening of left maxillary sinus. Mild right maxillary sinus mucosal thickening is new. No sinus fluid level. Mastoid air cells are clear.  Other: None.  IMPRESSION: 1. No acute intracranial abnormality. 2. Multiple remote infarcts unchanged from prior. Unchanged but age advanced atrophy and chronic small vessel ischemia.   lamictal level 4.9  Review of Systems: Patient complains of symptoms per HPI as well as the following symptoms: strokes. Pertinent negatives and positives per HPI. All others negative.   Social History   Socioeconomic History  . Marital status: Single    Spouse name: Not on file  . Number of children: 0  . Years of education: Not on file  . Highest education level: High school graduate  Occupational History  .  Not on file  Tobacco Use  . Smoking status: Former Smoker    Packs/day: 0.25    Types: Cigarettes    Quit date: 12/21/2017    Years since  quitting: 1.6  . Smokeless tobacco: Never Used  Substance and Sexual Activity  . Alcohol use: Not Currently  . Drug use: No  . Sexual activity: Not Currently  Other Topics Concern  . Not on file  Social History Narrative   Lives with a friend   Right handed   Caffeine: rare   Social Determinants of Health   Financial Resource Strain:   . Difficulty of Paying Living Expenses: Not on file  Food Insecurity:   . Worried About Charity fundraiser in the Last Year: Not on file  . Ran Out of Food in the Last Year: Not on file  Transportation Needs:   . Lack of Transportation (Medical): Not on file  . Lack of Transportation (Non-Medical): Not on file  Physical Activity:   . Days of Exercise per Week: Not on file  . Minutes of Exercise per Session: Not on file  Stress:   . Feeling of Stress : Not on file  Social Connections:   . Frequency of Communication with Friends and Family: Not on file  . Frequency of Social Gatherings with Friends and Family: Not on file  . Attends Religious Services: Not on file  . Active Member of Clubs or Organizations: Not on file  . Attends Archivist Meetings: Not on file  . Marital Status: Not on file  Intimate Partner Violence:   . Fear of Current or Ex-Partner: Not on file  . Emotionally Abused: Not on file  . Physically Abused: Not on file  . Sexually Abused: Not on file    Family History  Problem Relation Age of Onset  . Diabetes Mother   . Diabetes Maternal Grandmother   . Seizures Neg Hx        not on mother's side. he doesn't know about his father's side    Past Medical History:  Diagnosis Date  . Chronic kidney disease   . ESRD (end stage renal disease) ([place])   . Hypertension   . Lupus ([place])   . Seizures ([place])   . Seizures ([place])   . Stroke [facility])    40 or 39 years of age.    . Wears glasses     Patient Active Problem List   Diagnosis Date Noted  . Seizure, late effect of stroke ([place]) 08/26/2019  . Acute renal  failure superimposed on chronic kidney disease ([place]) 09/20/2018  . ESRD (end stage renal disease) ([place]) 03/18/2018  . Prediabetes 01/10/2018  . ARF (acute renal failure) ([place]) 12/21/2017  . Chest pain in adult   . CKD (chronic kidney disease) stage 3, GFR 30-59 ml/min 10/24/2015  . Seizures ([place]) 10/24/2015  . HTN (hypertension) 01/12/2015  . Chronic anticoagulation 04/24/2011  . Lupus anticoagulant positive 04/24/2011  . OBESITY, NOS 08/15/2006  . THROMBOCYTOPENIA 08/15/2006  . TOBACCO DEPENDENCE 08/15/2006  . CVA 08/15/2006    Past Surgical History:  Procedure Laterality Date  . AV FISTULA PLACEMENT Left 12/30/2017   Procedure: ARTERIOVENOUS (AV) FISTULA CREATION VERSUS INSERTION OF ARTERIOVENOUS GRAFT LEFT ARM;  Surgeon: Rosetta Posner, MD;  Location: [place];  Service: Vascular;  Laterality: Left;  . BASCILIC VEIN TRANSPOSITION Left 08/15/2018   Procedure: LEFT FIRST STAGE BASCILIC VEIN TRANSPOSITION;  Surgeon: Rosetta Posner, MD;  Location: [place];  Service:  Vascular;  Laterality: Left;  . BASCILIC VEIN TRANSPOSITION Left 10/30/2018   Procedure: INSERTION OF GORE-TEX GRAFT LEFT UPPER ARM;  Surgeon: Waynetta Sandy, MD;  Location: [place];  Service: Vascular;  Laterality: Left;  . INSERTION OF DIALYSIS CATHETER N/A 09/20/2018   Procedure: INSERTION OF TUNNELED DIALYSIS CATHETER RIGHT INTERNAL JUGULAR;  Surgeon: Marty Heck, MD;  Location: [place];  Service: Vascular;  Laterality: N/A;  . RENAL BIOPSY      Current Outpatient Medications  Medication Sig Dispense Refill  . amLODipine (NORVASC) 10 MG tablet Take 1 tablet (10 mg total) by mouth daily. 90 tablet 3  . apixaban (ELIQUIS) 2.5 MG TABS tablet Take 1 tablet (2.5 mg total) by mouth 2 (two) times daily. TAKE 1 TABLET (2.5 MG TOTAL) BY MOUTH 2 (TWO) TIMES DAILY. 180 tablet 1  . atorvastatin (LIPITOR) 40 MG tablet Take 1 tablet (40 mg total) by mouth daily at 6 PM. 90 tablet 3  . calcitRIOL (ROCALTROL) 0.25 MCG capsule  Take 1 capsule (0.25 mcg total) by mouth daily. 30 capsule 2  . calcium acetate (PHOSLO) 667 MG capsule Take 1 capsule (667 mg total) by mouth See admin instructions. Take 667 mg with each meal and each snack (Patient taking differently: Take 667-2,001 mg by mouth daily. )    . carvedilol (COREG) 3.125 MG tablet Take 0.5 tablets (1.562 mg total) by mouth 2 (two) times daily with a meal. 90 tablet 0  . hydrochlorothiazide (HYDRODIURIL) 25 MG tablet TAKE 1 TABLET (25 MG TOTAL) BY MOUTH DAILY. 90 tablet 2  . oxyCODONE-acetaminophen (PERCOCET) 5-325 MG tablet Take 1 tablet by mouth every 6 (six) hours as needed for severe pain. 20 tablet 0  . silver sulfADIAZINE (SILVADENE) 1 % cream Apply to affected skin areas once per day for 10 days or until area is healed 50 g 1  . LamoTRIgine 300 MG TB24 24 hour tablet Take 1 tablet (300 mg total) by mouth daily. 90 tablet 3   No current facility-administered medications for this visit.    Allergies as of 08/26/2019 - Review Complete 08/26/2019  Allergen Reaction Noted  . Zyrtec [cetirizine] Swelling 06/29/2017    Vitals: BP 135/80 (BP Location: Right Arm, Patient Position: Sitting)   Pulse 88   Temp 98.4 F (36.9 C) Comment: taken at front  Ht 6' (1.829 m)   Wt 246 lb (111.6 kg)   BMI 33.36 kg/m  Last Weight:  Wt Readings from Last 1 Encounters:  08/26/19 246 lb (111.6 kg)   Last Height:   Ht Readings from Last 1 Encounters:  08/26/19 6' (1.829 m)     Physical exam: Exam: Gen: NAD, conversant, well nourised,  well groomed                     CV: RRR, no MRG. No Carotid Bruits. No peripheral edema, warm, nontender Eyes: Conjunctivae clear without exudates or hemorrhage  Neuro: Detailed Neurologic Exam  Speech:    Speech is normal; fluent and spontaneous with normal comprehension.  Cognition:    The patient is oriented to person, place, and time;     recent and remote memory intact;     language fluent;     normal attention,  concentration,     fund of knowledge Cranial Nerves:    The pupils are equal, round, and reactive to light. Could not visualize fundi. Proptosis of the right eye with nystagmus, left nasolabial flattening,   Visual fields are full  to finger confrontation. Extraocular movements are intact. Trigeminal sensation is intact and the muscles of mastication are normal. The palate elevates in the midline. Hearing intact. Voice is normal. Shoulder shrug is normal. The tongue has normal motion without fasciculations.   Coordination:    Normal finger to nose and heel to shin.   Gait:  normal native gait  Motor Observation:    No asymmetry, no atrophy, and no involuntary movements noted. Tone:    Normal muscle tone.    Posture:    Posture is normal. normal erect    Strength:    Strength is V/V in the upper and lower limbs.      Sensation: intact to LT     Reflex Exam:  DTR's:    Deep tendon reflexes in the upper and lower extremities are symmetrical bilaterally.   Toes:    The toes are equivalent bilaterally.   Clonus:    Clonus is absent.    Assessment/Plan:  39 y.o. male here as requested by Gildardo Pounds, NP for seizures, strokes now on chronic anticoagulation, breakthrough seizure in the setting of noncompliance with his Lamictal. PMHx  hypertension, lupus, seizures, stroke (80 for 39 years of age), hypertension, chronic anticoagulation, lupus anticoagulant positive, obesity, tobacco dependence, end-stage renal disease on dialysis, prediabetes. Seizures likely secondary to encephalomalacia, remote strokes in the setting of cocaine use and lupus anticoagulant positive.  Well controlled on Lamictal, has had breakthrough in the setting of non-compliance. Discussed compliance today.  Discussed: Discussed Patients with epilepsy have a small risk of sudden unexpected death, a condition referred to as sudden unexpected death in epilepsy (SUDEP). SUDEP is defined specifically as the sudden,  unexpected, witnessed or unwitnessed, nontraumatic and nondrowning death in patients with epilepsy with or without evidence for a seizure, and excluding documented status epilepticus, in which post mortem examination does not reveal a structural or toxicologic cause for death  Discussed: Per [facility] statutes, patients with seizures are not allowed to drive until they have been seizure-free for six months.    Use caution when using heavy equipment or power tools. Avoid working on ladders or at heights. Take showers instead of baths. Ensure the water temperature is not too high on the home water heater. Do not go swimming alone. Do not lock yourself in a room alone (i.e. bathroom). When caring for infants or small children, sit down when holding, feeding, or changing them to minimize risk of injury to the child in the event you have a seizure. Maintain good sleep hygiene. Avoid alcohol.    If patient has another seizure, call 911 and bring them back to the ED if: A.  The seizure lasts longer than 5 minutes.      B.  The patient doesn't wake shortly after the seizure or has new problems such as difficulty seeing, speaking or moving following the seizure C.  The patient was injured during the seizure D.  The patient has a temperature over 102 F (39C) E.  The patient vomited during the seizure and now is having trouble breathing  Per [facility] statutes, patients with seizures are not allowed to drive until they have been seizure-free for six months.  Other recommendations include using caution when using heavy equipment or power tools. Avoid working on ladders or at heights. Take showers instead of baths.  Do not swim alone.  Ensure the water temperature is not too high on the home water heater. Do not go swimming alone.  Do not lock yourself in a room alone (i.e. bathroom). When caring for infants or small children, sit down when holding, feeding, or changing them to minimize risk of  injury to the child in the event you have a seizure. Maintain good sleep hygiene. Avoid alcohol.  Also recommend adequate sleep, hydration, good diet and minimize stress.  During the Seizure  - First, ensure adequate ventilation and place patients on the floor on their left side  Loosen clothing around the neck and ensure the airway is patent. If the patient is clenching the teeth, do not force the mouth open with any object as this can cause severe damage - Remove all items from the surrounding that can be hazardous. The patient may be oblivious to what's happening and may not even know what he or she is doing. If the patient is confused and wandering, either gently guide him/her away and block access to outside areas - Reassure the individual and be comforting - Call 911. In most cases, the seizure ends before EMS arrives. However, there are cases when seizures may last over 3 to 5 minutes. Or the individual may have developed breathing difficulties or severe injuries. If a pregnant patient or a person with diabetes develops a seizure, it is prudent to call an ambulance. - Finally, if the patient does not regain full consciousness, then call EMS. Most patients will remain confused for about 45 to 90 minutes after a seizure, so you must use judgment in calling for help. - Avoid restraints but make sure the patient is in a bed with padded side rails - Place the individual in a lateral position with the neck slightly flexed; this will help the saliva drain from the mouth and prevent the tongue from falling backward - Remove all nearby furniture and other hazards from the area - Provide verbal assurance as the individual is regaining consciousness - Provide the patient with privacy if possible - Call for help and start treatment as ordered by the caregiver   fter the Seizure (Postictal Stage)  After a seizure, most patients experience confusion, fatigue, muscle pain and/or a headache. Thus, one  should permit the individual to sleep. For the next few days, reassurance is essential. Being calm and helping reorient the person is also of importance.  Most seizures are painless and end spontaneously. Seizures are not harmful to others but can lead to complications such as stress on the lungs, brain and the heart. Individuals with prior lung problems may develop labored breathing and respiratory distress.    No orders of the defined types were placed in this encounter.  Meds ordered this encounter  Medications  . LamoTRIgine 300 MG TB24 24 hour tablet    Sig: Take 1 tablet (300 mg total) by mouth daily.    Dispense:  90 tablet    Refill:  3    Cc: Gildardo Pounds, NP  Sarina Ill, MD  [facility] [address] [place], [place] [address]  Phone [phone] Fax [phone]  I spent 60 minutes of face-to-face and non-face-to-face time with patient on the  1. Seizure, late effect of stroke ([place])   2. Non compliance w medication regimen   3. Breakthrough seizure ([place])    diagnosis.  This included previsit chart review, lab review, study review, order entry, electronic health record documentation, patient education on the different diagnostic and therapeutic options, counseling and coordination of care, risks and benefits of management, compliance, or risk factor reduction

## 2019-08-27 DIAGNOSIS — M3214 Glomerular disease in systemic lupus erythematosus: Secondary | ICD-10-CM | POA: Diagnosis not present

## 2019-08-27 DIAGNOSIS — R561 Post traumatic seizures: Secondary | ICD-10-CM | POA: Diagnosis not present

## 2019-08-27 DIAGNOSIS — Z8673 Personal history of transient ischemic attack (TIA), and cerebral infarction without residual deficits: Secondary | ICD-10-CM | POA: Diagnosis not present

## 2019-08-27 DIAGNOSIS — Z79899 Other long term (current) drug therapy: Secondary | ICD-10-CM | POA: Diagnosis not present

## 2019-08-27 DIAGNOSIS — Z23 Encounter for immunization: Secondary | ICD-10-CM | POA: Diagnosis not present

## 2019-08-28 DIAGNOSIS — Z992 Dependence on renal dialysis: Secondary | ICD-10-CM | POA: Diagnosis not present

## 2019-08-28 DIAGNOSIS — N2581 Secondary hyperparathyroidism of renal origin: Secondary | ICD-10-CM | POA: Diagnosis not present

## 2019-08-28 DIAGNOSIS — N186 End stage renal disease: Secondary | ICD-10-CM | POA: Diagnosis not present

## 2019-08-31 DIAGNOSIS — N2581 Secondary hyperparathyroidism of renal origin: Secondary | ICD-10-CM | POA: Diagnosis not present

## 2019-08-31 DIAGNOSIS — N186 End stage renal disease: Secondary | ICD-10-CM | POA: Diagnosis not present

## 2019-08-31 DIAGNOSIS — Z992 Dependence on renal dialysis: Secondary | ICD-10-CM | POA: Diagnosis not present

## 2019-09-02 DIAGNOSIS — N186 End stage renal disease: Secondary | ICD-10-CM | POA: Diagnosis not present

## 2019-09-02 DIAGNOSIS — Z992 Dependence on renal dialysis: Secondary | ICD-10-CM | POA: Diagnosis not present

## 2019-09-02 DIAGNOSIS — N2581 Secondary hyperparathyroidism of renal origin: Secondary | ICD-10-CM | POA: Diagnosis not present

## 2019-09-04 DIAGNOSIS — Z992 Dependence on renal dialysis: Secondary | ICD-10-CM | POA: Diagnosis not present

## 2019-09-04 DIAGNOSIS — N2581 Secondary hyperparathyroidism of renal origin: Secondary | ICD-10-CM | POA: Diagnosis not present

## 2019-09-04 DIAGNOSIS — N186 End stage renal disease: Secondary | ICD-10-CM | POA: Diagnosis not present

## 2019-09-07 DIAGNOSIS — N186 End stage renal disease: Secondary | ICD-10-CM | POA: Diagnosis not present

## 2019-09-07 DIAGNOSIS — N2581 Secondary hyperparathyroidism of renal origin: Secondary | ICD-10-CM | POA: Diagnosis not present

## 2019-09-07 DIAGNOSIS — Z992 Dependence on renal dialysis: Secondary | ICD-10-CM | POA: Diagnosis not present

## 2019-09-07 MED FILL — LIDOCAINE-PRILOCAINE 2.5-2.: 2.5-2.5 | 30 days supply | Qty: 60 | Fill #1

## 2019-09-08 MED FILL — HYDROXYCHLOROQUINE SULFATE: 200 | 30 days supply | Qty: 30 | Fill #0

## 2019-09-09 DIAGNOSIS — N2581 Secondary hyperparathyroidism of renal origin: Secondary | ICD-10-CM | POA: Diagnosis not present

## 2019-09-09 DIAGNOSIS — N186 End stage renal disease: Secondary | ICD-10-CM | POA: Diagnosis not present

## 2019-09-09 DIAGNOSIS — Z992 Dependence on renal dialysis: Secondary | ICD-10-CM | POA: Diagnosis not present

## 2019-09-11 DIAGNOSIS — Z992 Dependence on renal dialysis: Secondary | ICD-10-CM | POA: Diagnosis not present

## 2019-09-11 DIAGNOSIS — N186 End stage renal disease: Secondary | ICD-10-CM | POA: Diagnosis not present

## 2019-09-11 DIAGNOSIS — N2581 Secondary hyperparathyroidism of renal origin: Secondary | ICD-10-CM | POA: Diagnosis not present

## 2019-09-14 DIAGNOSIS — Z992 Dependence on renal dialysis: Secondary | ICD-10-CM | POA: Diagnosis not present

## 2019-09-14 DIAGNOSIS — N2581 Secondary hyperparathyroidism of renal origin: Secondary | ICD-10-CM | POA: Diagnosis not present

## 2019-09-14 DIAGNOSIS — N186 End stage renal disease: Secondary | ICD-10-CM | POA: Diagnosis not present

## 2019-09-16 DIAGNOSIS — I129 Hypertensive chronic kidney disease with stage 1 through stage 4 chronic kidney disease, or unspecified chronic kidney disease: Secondary | ICD-10-CM | POA: Diagnosis not present

## 2019-09-16 DIAGNOSIS — N186 End stage renal disease: Secondary | ICD-10-CM | POA: Diagnosis not present

## 2019-09-16 DIAGNOSIS — Z992 Dependence on renal dialysis: Secondary | ICD-10-CM | POA: Diagnosis not present

## 2019-09-16 DIAGNOSIS — N2581 Secondary hyperparathyroidism of renal origin: Secondary | ICD-10-CM | POA: Diagnosis not present

## 2019-09-18 DIAGNOSIS — Z992 Dependence on renal dialysis: Secondary | ICD-10-CM | POA: Diagnosis not present

## 2019-09-18 DIAGNOSIS — N2581 Secondary hyperparathyroidism of renal origin: Secondary | ICD-10-CM | POA: Diagnosis not present

## 2019-09-18 DIAGNOSIS — N186 End stage renal disease: Secondary | ICD-10-CM | POA: Diagnosis not present

## 2019-09-21 DIAGNOSIS — N2581 Secondary hyperparathyroidism of renal origin: Secondary | ICD-10-CM | POA: Diagnosis not present

## 2019-09-21 DIAGNOSIS — N186 End stage renal disease: Secondary | ICD-10-CM | POA: Diagnosis not present

## 2019-09-21 DIAGNOSIS — Z992 Dependence on renal dialysis: Secondary | ICD-10-CM | POA: Diagnosis not present

## 2019-09-23 DIAGNOSIS — N2581 Secondary hyperparathyroidism of renal origin: Secondary | ICD-10-CM | POA: Diagnosis not present

## 2019-09-23 DIAGNOSIS — Z992 Dependence on renal dialysis: Secondary | ICD-10-CM | POA: Diagnosis not present

## 2019-09-23 DIAGNOSIS — N186 End stage renal disease: Secondary | ICD-10-CM | POA: Diagnosis not present

## 2019-09-25 DIAGNOSIS — N2581 Secondary hyperparathyroidism of renal origin: Secondary | ICD-10-CM | POA: Diagnosis not present

## 2019-09-25 DIAGNOSIS — N186 End stage renal disease: Secondary | ICD-10-CM | POA: Diagnosis not present

## 2019-09-25 DIAGNOSIS — Z992 Dependence on renal dialysis: Secondary | ICD-10-CM | POA: Diagnosis not present

## 2019-09-28 DIAGNOSIS — N186 End stage renal disease: Secondary | ICD-10-CM | POA: Diagnosis not present

## 2019-09-28 DIAGNOSIS — N2581 Secondary hyperparathyroidism of renal origin: Secondary | ICD-10-CM | POA: Diagnosis not present

## 2019-09-28 DIAGNOSIS — Z992 Dependence on renal dialysis: Secondary | ICD-10-CM | POA: Diagnosis not present

## 2019-09-30 DIAGNOSIS — N186 End stage renal disease: Secondary | ICD-10-CM | POA: Diagnosis not present

## 2019-09-30 DIAGNOSIS — N2581 Secondary hyperparathyroidism of renal origin: Secondary | ICD-10-CM | POA: Diagnosis not present

## 2019-09-30 DIAGNOSIS — Z992 Dependence on renal dialysis: Secondary | ICD-10-CM | POA: Diagnosis not present

## 2019-10-02 DIAGNOSIS — Z992 Dependence on renal dialysis: Secondary | ICD-10-CM | POA: Diagnosis not present

## 2019-10-02 DIAGNOSIS — N186 End stage renal disease: Secondary | ICD-10-CM | POA: Diagnosis not present

## 2019-10-02 DIAGNOSIS — N2581 Secondary hyperparathyroidism of renal origin: Secondary | ICD-10-CM | POA: Diagnosis not present

## 2019-10-05 ENCOUNTER — Ambulatory Visit: Payer: BLUE CROSS/BLUE SHIELD | Admitting: Nurse Practitioner

## 2019-10-05 DIAGNOSIS — N186 End stage renal disease: Secondary | ICD-10-CM | POA: Diagnosis not present

## 2019-10-05 DIAGNOSIS — Z992 Dependence on renal dialysis: Secondary | ICD-10-CM | POA: Diagnosis not present

## 2019-10-05 DIAGNOSIS — N2581 Secondary hyperparathyroidism of renal origin: Secondary | ICD-10-CM | POA: Diagnosis not present

## 2019-10-07 DIAGNOSIS — Z992 Dependence on renal dialysis: Secondary | ICD-10-CM | POA: Diagnosis not present

## 2019-10-07 DIAGNOSIS — N2581 Secondary hyperparathyroidism of renal origin: Secondary | ICD-10-CM | POA: Diagnosis not present

## 2019-10-07 DIAGNOSIS — N186 End stage renal disease: Secondary | ICD-10-CM | POA: Diagnosis not present

## 2019-10-08 MED FILL — LIDOCAINE-PRILOCAINE 2.5-2.: 2.5-2.5 | 30 days supply | Qty: 60 | Fill #2

## 2019-10-09 DIAGNOSIS — N2581 Secondary hyperparathyroidism of renal origin: Secondary | ICD-10-CM | POA: Diagnosis not present

## 2019-10-09 DIAGNOSIS — N186 End stage renal disease: Secondary | ICD-10-CM | POA: Diagnosis not present

## 2019-10-09 DIAGNOSIS — Z992 Dependence on renal dialysis: Secondary | ICD-10-CM | POA: Diagnosis not present

## 2019-10-12 DIAGNOSIS — N2581 Secondary hyperparathyroidism of renal origin: Secondary | ICD-10-CM | POA: Diagnosis not present

## 2019-10-12 DIAGNOSIS — Z992 Dependence on renal dialysis: Secondary | ICD-10-CM | POA: Diagnosis not present

## 2019-10-12 DIAGNOSIS — N186 End stage renal disease: Secondary | ICD-10-CM | POA: Diagnosis not present

## 2019-10-14 DIAGNOSIS — N186 End stage renal disease: Secondary | ICD-10-CM | POA: Diagnosis not present

## 2019-10-14 DIAGNOSIS — Z992 Dependence on renal dialysis: Secondary | ICD-10-CM | POA: Diagnosis not present

## 2019-10-14 DIAGNOSIS — N2581 Secondary hyperparathyroidism of renal origin: Secondary | ICD-10-CM | POA: Diagnosis not present

## 2019-10-16 DIAGNOSIS — N2581 Secondary hyperparathyroidism of renal origin: Secondary | ICD-10-CM | POA: Diagnosis not present

## 2019-10-16 DIAGNOSIS — Z992 Dependence on renal dialysis: Secondary | ICD-10-CM | POA: Diagnosis not present

## 2019-10-16 DIAGNOSIS — I129 Hypertensive chronic kidney disease with stage 1 through stage 4 chronic kidney disease, or unspecified chronic kidney disease: Secondary | ICD-10-CM | POA: Diagnosis not present

## 2019-10-16 DIAGNOSIS — N186 End stage renal disease: Secondary | ICD-10-CM | POA: Diagnosis not present

## 2019-10-19 DIAGNOSIS — N2581 Secondary hyperparathyroidism of renal origin: Secondary | ICD-10-CM | POA: Diagnosis not present

## 2019-10-19 DIAGNOSIS — Z992 Dependence on renal dialysis: Secondary | ICD-10-CM | POA: Diagnosis not present

## 2019-10-19 DIAGNOSIS — N186 End stage renal disease: Secondary | ICD-10-CM | POA: Diagnosis not present

## 2019-10-21 DIAGNOSIS — Z992 Dependence on renal dialysis: Secondary | ICD-10-CM | POA: Diagnosis not present

## 2019-10-21 DIAGNOSIS — N2581 Secondary hyperparathyroidism of renal origin: Secondary | ICD-10-CM | POA: Diagnosis not present

## 2019-10-21 DIAGNOSIS — N186 End stage renal disease: Secondary | ICD-10-CM | POA: Diagnosis not present

## 2019-10-23 DIAGNOSIS — N186 End stage renal disease: Secondary | ICD-10-CM | POA: Diagnosis not present

## 2019-10-23 DIAGNOSIS — N2581 Secondary hyperparathyroidism of renal origin: Secondary | ICD-10-CM | POA: Diagnosis not present

## 2019-10-23 DIAGNOSIS — Z992 Dependence on renal dialysis: Secondary | ICD-10-CM | POA: Diagnosis not present

## 2019-10-26 DIAGNOSIS — N186 End stage renal disease: Secondary | ICD-10-CM | POA: Diagnosis not present

## 2019-10-26 DIAGNOSIS — Z992 Dependence on renal dialysis: Secondary | ICD-10-CM | POA: Diagnosis not present

## 2019-10-26 DIAGNOSIS — N2581 Secondary hyperparathyroidism of renal origin: Secondary | ICD-10-CM | POA: Diagnosis not present

## 2019-10-28 DIAGNOSIS — Z992 Dependence on renal dialysis: Secondary | ICD-10-CM | POA: Diagnosis not present

## 2019-10-28 DIAGNOSIS — N2581 Secondary hyperparathyroidism of renal origin: Secondary | ICD-10-CM | POA: Diagnosis not present

## 2019-10-28 DIAGNOSIS — N186 End stage renal disease: Secondary | ICD-10-CM | POA: Diagnosis not present

## 2019-10-30 DIAGNOSIS — N2581 Secondary hyperparathyroidism of renal origin: Secondary | ICD-10-CM | POA: Diagnosis not present

## 2019-10-30 DIAGNOSIS — N186 End stage renal disease: Secondary | ICD-10-CM | POA: Diagnosis not present

## 2019-10-30 DIAGNOSIS — Z992 Dependence on renal dialysis: Secondary | ICD-10-CM | POA: Diagnosis not present

## 2019-11-02 DIAGNOSIS — N186 End stage renal disease: Secondary | ICD-10-CM | POA: Diagnosis not present

## 2019-11-02 DIAGNOSIS — N2581 Secondary hyperparathyroidism of renal origin: Secondary | ICD-10-CM | POA: Diagnosis not present

## 2019-11-02 DIAGNOSIS — Z992 Dependence on renal dialysis: Secondary | ICD-10-CM | POA: Diagnosis not present

## 2019-11-04 DIAGNOSIS — N186 End stage renal disease: Secondary | ICD-10-CM | POA: Diagnosis not present

## 2019-11-04 DIAGNOSIS — N2581 Secondary hyperparathyroidism of renal origin: Secondary | ICD-10-CM | POA: Diagnosis not present

## 2019-11-04 DIAGNOSIS — Z992 Dependence on renal dialysis: Secondary | ICD-10-CM | POA: Diagnosis not present

## 2019-11-04 MED FILL — LIDOCAINE-PRILOCAINE 2.5-2.: 2.5-2.5 | 30 days supply | Qty: 60 | Fill #3

## 2019-11-06 DIAGNOSIS — Z992 Dependence on renal dialysis: Secondary | ICD-10-CM | POA: Diagnosis not present

## 2019-11-06 DIAGNOSIS — N186 End stage renal disease: Secondary | ICD-10-CM | POA: Diagnosis not present

## 2019-11-06 DIAGNOSIS — N2581 Secondary hyperparathyroidism of renal origin: Secondary | ICD-10-CM | POA: Diagnosis not present

## 2019-11-09 DIAGNOSIS — N2581 Secondary hyperparathyroidism of renal origin: Secondary | ICD-10-CM | POA: Diagnosis not present

## 2019-11-09 DIAGNOSIS — Z992 Dependence on renal dialysis: Secondary | ICD-10-CM | POA: Diagnosis not present

## 2019-11-09 DIAGNOSIS — N186 End stage renal disease: Secondary | ICD-10-CM | POA: Diagnosis not present

## 2019-11-11 DIAGNOSIS — N186 End stage renal disease: Secondary | ICD-10-CM | POA: Diagnosis not present

## 2019-11-11 DIAGNOSIS — N2581 Secondary hyperparathyroidism of renal origin: Secondary | ICD-10-CM | POA: Diagnosis not present

## 2019-11-11 DIAGNOSIS — Z992 Dependence on renal dialysis: Secondary | ICD-10-CM | POA: Diagnosis not present

## 2019-11-13 DIAGNOSIS — Z992 Dependence on renal dialysis: Secondary | ICD-10-CM | POA: Diagnosis not present

## 2019-11-13 DIAGNOSIS — N186 End stage renal disease: Secondary | ICD-10-CM | POA: Diagnosis not present

## 2019-11-13 DIAGNOSIS — N2581 Secondary hyperparathyroidism of renal origin: Secondary | ICD-10-CM | POA: Diagnosis not present

## 2019-11-16 DIAGNOSIS — I129 Hypertensive chronic kidney disease with stage 1 through stage 4 chronic kidney disease, or unspecified chronic kidney disease: Secondary | ICD-10-CM | POA: Diagnosis not present

## 2019-11-16 DIAGNOSIS — N2581 Secondary hyperparathyroidism of renal origin: Secondary | ICD-10-CM | POA: Diagnosis not present

## 2019-11-16 DIAGNOSIS — Z992 Dependence on renal dialysis: Secondary | ICD-10-CM | POA: Diagnosis not present

## 2019-11-16 DIAGNOSIS — N186 End stage renal disease: Secondary | ICD-10-CM | POA: Diagnosis not present

## 2019-11-18 DIAGNOSIS — N2581 Secondary hyperparathyroidism of renal origin: Secondary | ICD-10-CM | POA: Diagnosis not present

## 2019-11-18 DIAGNOSIS — Z992 Dependence on renal dialysis: Secondary | ICD-10-CM | POA: Diagnosis not present

## 2019-11-18 DIAGNOSIS — N186 End stage renal disease: Secondary | ICD-10-CM | POA: Diagnosis not present

## 2019-11-20 DIAGNOSIS — N186 End stage renal disease: Secondary | ICD-10-CM | POA: Diagnosis not present

## 2019-11-20 DIAGNOSIS — N2581 Secondary hyperparathyroidism of renal origin: Secondary | ICD-10-CM | POA: Diagnosis not present

## 2019-11-20 DIAGNOSIS — Z992 Dependence on renal dialysis: Secondary | ICD-10-CM | POA: Diagnosis not present

## 2019-11-23 DIAGNOSIS — N186 End stage renal disease: Secondary | ICD-10-CM | POA: Diagnosis not present

## 2019-11-23 DIAGNOSIS — N2581 Secondary hyperparathyroidism of renal origin: Secondary | ICD-10-CM | POA: Diagnosis not present

## 2019-11-23 DIAGNOSIS — Z992 Dependence on renal dialysis: Secondary | ICD-10-CM | POA: Diagnosis not present

## 2019-11-24 DIAGNOSIS — M3214 Glomerular disease in systemic lupus erythematosus: Secondary | ICD-10-CM | POA: Diagnosis not present

## 2019-11-24 DIAGNOSIS — Z8673 Personal history of transient ischemic attack (TIA), and cerebral infarction without residual deficits: Secondary | ICD-10-CM | POA: Diagnosis not present

## 2019-11-24 DIAGNOSIS — R561 Post traumatic seizures: Secondary | ICD-10-CM | POA: Diagnosis not present

## 2019-11-24 DIAGNOSIS — N186 End stage renal disease: Secondary | ICD-10-CM | POA: Diagnosis not present

## 2019-11-24 DIAGNOSIS — Z79899 Other long term (current) drug therapy: Secondary | ICD-10-CM | POA: Diagnosis not present

## 2019-11-25 DIAGNOSIS — Z992 Dependence on renal dialysis: Secondary | ICD-10-CM | POA: Diagnosis not present

## 2019-11-25 DIAGNOSIS — N186 End stage renal disease: Secondary | ICD-10-CM | POA: Diagnosis not present

## 2019-11-25 DIAGNOSIS — N2581 Secondary hyperparathyroidism of renal origin: Secondary | ICD-10-CM | POA: Diagnosis not present

## 2019-11-27 DIAGNOSIS — Z992 Dependence on renal dialysis: Secondary | ICD-10-CM | POA: Diagnosis not present

## 2019-11-27 DIAGNOSIS — N2581 Secondary hyperparathyroidism of renal origin: Secondary | ICD-10-CM | POA: Diagnosis not present

## 2019-11-27 DIAGNOSIS — N186 End stage renal disease: Secondary | ICD-10-CM | POA: Diagnosis not present

## 2019-11-30 DIAGNOSIS — Z992 Dependence on renal dialysis: Secondary | ICD-10-CM | POA: Diagnosis not present

## 2019-11-30 DIAGNOSIS — N186 End stage renal disease: Secondary | ICD-10-CM | POA: Diagnosis not present

## 2019-11-30 DIAGNOSIS — N2581 Secondary hyperparathyroidism of renal origin: Secondary | ICD-10-CM | POA: Diagnosis not present

## 2019-12-02 DIAGNOSIS — N2581 Secondary hyperparathyroidism of renal origin: Secondary | ICD-10-CM | POA: Diagnosis not present

## 2019-12-02 DIAGNOSIS — I6782 Cerebral ischemia: Secondary | ICD-10-CM | POA: Diagnosis not present

## 2019-12-02 DIAGNOSIS — N186 End stage renal disease: Secondary | ICD-10-CM | POA: Diagnosis not present

## 2019-12-02 DIAGNOSIS — Z992 Dependence on renal dialysis: Secondary | ICD-10-CM | POA: Diagnosis not present

## 2019-12-02 DIAGNOSIS — Z23 Encounter for immunization: Secondary | ICD-10-CM | POA: Diagnosis not present

## 2019-12-02 DIAGNOSIS — M3214 Glomerular disease in systemic lupus erythematosus: Secondary | ICD-10-CM | POA: Diagnosis not present

## 2019-12-02 DIAGNOSIS — Z8673 Personal history of transient ischemic attack (TIA), and cerebral infarction without residual deficits: Secondary | ICD-10-CM | POA: Diagnosis not present

## 2019-12-02 DIAGNOSIS — R569 Unspecified convulsions: Secondary | ICD-10-CM | POA: Diagnosis not present

## 2019-12-02 DIAGNOSIS — Z79899 Other long term (current) drug therapy: Secondary | ICD-10-CM | POA: Diagnosis not present

## 2019-12-02 DIAGNOSIS — I639 Cerebral infarction, unspecified: Secondary | ICD-10-CM | POA: Diagnosis not present

## 2019-12-02 DIAGNOSIS — R561 Post traumatic seizures: Secondary | ICD-10-CM | POA: Diagnosis not present

## 2019-12-04 DIAGNOSIS — N186 End stage renal disease: Secondary | ICD-10-CM | POA: Diagnosis not present

## 2019-12-04 DIAGNOSIS — N2581 Secondary hyperparathyroidism of renal origin: Secondary | ICD-10-CM | POA: Diagnosis not present

## 2019-12-04 DIAGNOSIS — Z992 Dependence on renal dialysis: Secondary | ICD-10-CM | POA: Diagnosis not present

## 2019-12-07 DIAGNOSIS — N2581 Secondary hyperparathyroidism of renal origin: Secondary | ICD-10-CM | POA: Diagnosis not present

## 2019-12-07 DIAGNOSIS — N186 End stage renal disease: Secondary | ICD-10-CM | POA: Diagnosis not present

## 2019-12-07 DIAGNOSIS — Z992 Dependence on renal dialysis: Secondary | ICD-10-CM | POA: Diagnosis not present

## 2019-12-09 DIAGNOSIS — Z992 Dependence on renal dialysis: Secondary | ICD-10-CM | POA: Diagnosis not present

## 2019-12-09 DIAGNOSIS — N186 End stage renal disease: Secondary | ICD-10-CM | POA: Diagnosis not present

## 2019-12-09 DIAGNOSIS — N2581 Secondary hyperparathyroidism of renal origin: Secondary | ICD-10-CM | POA: Diagnosis not present

## 2019-12-11 DIAGNOSIS — Z992 Dependence on renal dialysis: Secondary | ICD-10-CM | POA: Diagnosis not present

## 2019-12-11 DIAGNOSIS — N186 End stage renal disease: Secondary | ICD-10-CM | POA: Diagnosis not present

## 2019-12-11 DIAGNOSIS — N2581 Secondary hyperparathyroidism of renal origin: Secondary | ICD-10-CM | POA: Diagnosis not present

## 2019-12-14 DIAGNOSIS — N186 End stage renal disease: Secondary | ICD-10-CM | POA: Diagnosis not present

## 2019-12-14 DIAGNOSIS — Z992 Dependence on renal dialysis: Secondary | ICD-10-CM | POA: Diagnosis not present

## 2019-12-14 DIAGNOSIS — N2581 Secondary hyperparathyroidism of renal origin: Secondary | ICD-10-CM | POA: Diagnosis not present

## 2019-12-16 DIAGNOSIS — N186 End stage renal disease: Secondary | ICD-10-CM | POA: Diagnosis not present

## 2019-12-16 DIAGNOSIS — N2581 Secondary hyperparathyroidism of renal origin: Secondary | ICD-10-CM | POA: Diagnosis not present

## 2019-12-16 DIAGNOSIS — Z992 Dependence on renal dialysis: Secondary | ICD-10-CM | POA: Diagnosis not present

## 2019-12-16 DIAGNOSIS — I129 Hypertensive chronic kidney disease with stage 1 through stage 4 chronic kidney disease, or unspecified chronic kidney disease: Secondary | ICD-10-CM | POA: Diagnosis not present

## 2019-12-18 DIAGNOSIS — N2581 Secondary hyperparathyroidism of renal origin: Secondary | ICD-10-CM | POA: Diagnosis not present

## 2019-12-18 DIAGNOSIS — Z992 Dependence on renal dialysis: Secondary | ICD-10-CM | POA: Diagnosis not present

## 2019-12-18 DIAGNOSIS — N186 End stage renal disease: Secondary | ICD-10-CM | POA: Diagnosis not present

## 2019-12-21 DIAGNOSIS — N186 End stage renal disease: Secondary | ICD-10-CM | POA: Diagnosis not present

## 2019-12-21 DIAGNOSIS — Z992 Dependence on renal dialysis: Secondary | ICD-10-CM | POA: Diagnosis not present

## 2019-12-21 DIAGNOSIS — N2581 Secondary hyperparathyroidism of renal origin: Secondary | ICD-10-CM | POA: Diagnosis not present

## 2019-12-23 DIAGNOSIS — Z992 Dependence on renal dialysis: Secondary | ICD-10-CM | POA: Diagnosis not present

## 2019-12-23 DIAGNOSIS — N186 End stage renal disease: Secondary | ICD-10-CM | POA: Diagnosis not present

## 2019-12-23 DIAGNOSIS — N2581 Secondary hyperparathyroidism of renal origin: Secondary | ICD-10-CM | POA: Diagnosis not present

## 2019-12-25 DIAGNOSIS — N2581 Secondary hyperparathyroidism of renal origin: Secondary | ICD-10-CM | POA: Diagnosis not present

## 2019-12-25 DIAGNOSIS — N186 End stage renal disease: Secondary | ICD-10-CM | POA: Diagnosis not present

## 2019-12-25 DIAGNOSIS — Z992 Dependence on renal dialysis: Secondary | ICD-10-CM | POA: Diagnosis not present

## 2019-12-28 DIAGNOSIS — N2581 Secondary hyperparathyroidism of renal origin: Secondary | ICD-10-CM | POA: Diagnosis not present

## 2019-12-28 DIAGNOSIS — Z992 Dependence on renal dialysis: Secondary | ICD-10-CM | POA: Diagnosis not present

## 2019-12-28 DIAGNOSIS — N186 End stage renal disease: Secondary | ICD-10-CM | POA: Diagnosis not present

## 2019-12-30 DIAGNOSIS — N2581 Secondary hyperparathyroidism of renal origin: Secondary | ICD-10-CM | POA: Diagnosis not present

## 2019-12-30 DIAGNOSIS — N186 End stage renal disease: Secondary | ICD-10-CM | POA: Diagnosis not present

## 2019-12-30 DIAGNOSIS — Z992 Dependence on renal dialysis: Secondary | ICD-10-CM | POA: Diagnosis not present

## 2019-12-31 MED FILL — lamoTRIgine ER 300 MG TB24: 300 | 90 days supply | Qty: 90 | Fill #1

## 2020-01-01 DIAGNOSIS — Z992 Dependence on renal dialysis: Secondary | ICD-10-CM | POA: Diagnosis not present

## 2020-01-01 DIAGNOSIS — N2581 Secondary hyperparathyroidism of renal origin: Secondary | ICD-10-CM | POA: Diagnosis not present

## 2020-01-01 DIAGNOSIS — N186 End stage renal disease: Secondary | ICD-10-CM | POA: Diagnosis not present

## 2020-01-04 DIAGNOSIS — N186 End stage renal disease: Secondary | ICD-10-CM | POA: Diagnosis not present

## 2020-01-04 DIAGNOSIS — N2581 Secondary hyperparathyroidism of renal origin: Secondary | ICD-10-CM | POA: Diagnosis not present

## 2020-01-04 DIAGNOSIS — Z992 Dependence on renal dialysis: Secondary | ICD-10-CM | POA: Diagnosis not present

## 2020-01-04 DIAGNOSIS — T82858A Stenosis of vascular prosthetic devices, implants and grafts, initial encounter: Secondary | ICD-10-CM | POA: Diagnosis not present

## 2020-01-04 DIAGNOSIS — T82868A Thrombosis of vascular prosthetic devices, implants and grafts, initial encounter: Secondary | ICD-10-CM | POA: Diagnosis not present

## 2020-01-06 DIAGNOSIS — N2581 Secondary hyperparathyroidism of renal origin: Secondary | ICD-10-CM | POA: Diagnosis not present

## 2020-01-06 DIAGNOSIS — Z992 Dependence on renal dialysis: Secondary | ICD-10-CM | POA: Diagnosis not present

## 2020-01-06 DIAGNOSIS — N186 End stage renal disease: Secondary | ICD-10-CM | POA: Diagnosis not present

## 2020-01-08 DIAGNOSIS — N186 End stage renal disease: Secondary | ICD-10-CM | POA: Diagnosis not present

## 2020-01-08 DIAGNOSIS — N2581 Secondary hyperparathyroidism of renal origin: Secondary | ICD-10-CM | POA: Diagnosis not present

## 2020-01-08 DIAGNOSIS — Z992 Dependence on renal dialysis: Secondary | ICD-10-CM | POA: Diagnosis not present

## 2020-01-11 DIAGNOSIS — N2581 Secondary hyperparathyroidism of renal origin: Secondary | ICD-10-CM | POA: Diagnosis not present

## 2020-01-11 DIAGNOSIS — N186 End stage renal disease: Secondary | ICD-10-CM | POA: Diagnosis not present

## 2020-01-11 DIAGNOSIS — Z992 Dependence on renal dialysis: Secondary | ICD-10-CM | POA: Diagnosis not present

## 2020-01-13 DIAGNOSIS — Z992 Dependence on renal dialysis: Secondary | ICD-10-CM | POA: Diagnosis not present

## 2020-01-13 DIAGNOSIS — N2581 Secondary hyperparathyroidism of renal origin: Secondary | ICD-10-CM | POA: Diagnosis not present

## 2020-01-13 DIAGNOSIS — N186 End stage renal disease: Secondary | ICD-10-CM | POA: Diagnosis not present

## 2020-01-14 ENCOUNTER — Emergency Department (HOSPITAL_COMMUNITY): Payer: BC Managed Care – PPO

## 2020-01-14 ENCOUNTER — Emergency Department (HOSPITAL_COMMUNITY)
Admission: EM | Admit: 2020-01-14 | Discharge: 2020-01-14 | Disposition: A | Discharge status: Home / Self Care | Payer: BC Managed Care – PPO | Attending: Emergency Medicine | Admitting: Emergency Medicine

## 2020-01-14 ENCOUNTER — Other Ambulatory Visit: Payer: Self-pay

## 2020-01-14 DIAGNOSIS — Y939 Activity, unspecified: Secondary | ICD-10-CM | POA: Insufficient documentation

## 2020-01-14 DIAGNOSIS — G319 Degenerative disease of nervous system, unspecified: Secondary | ICD-10-CM | POA: Diagnosis not present

## 2020-01-14 DIAGNOSIS — Y9289 Other specified places as the place of occurrence of the external cause: Secondary | ICD-10-CM | POA: Diagnosis not present

## 2020-01-14 DIAGNOSIS — X58XXXA Exposure to other specified factors, initial encounter: Secondary | ICD-10-CM | POA: Diagnosis not present

## 2020-01-14 DIAGNOSIS — R569 Unspecified convulsions: Secondary | ICD-10-CM | POA: Diagnosis not present

## 2020-01-14 DIAGNOSIS — S0003XA Contusion of scalp, initial encounter: Secondary | ICD-10-CM | POA: Diagnosis not present

## 2020-01-14 DIAGNOSIS — K056 Periodontal disease, unspecified: Secondary | ICD-10-CM | POA: Diagnosis not present

## 2020-01-14 DIAGNOSIS — S0000XA Unspecified superficial injury of scalp, initial encounter: Secondary | ICD-10-CM | POA: Diagnosis not present

## 2020-01-14 DIAGNOSIS — G40909 Epilepsy, unspecified, not intractable, without status epilepticus: Secondary | ICD-10-CM | POA: Diagnosis not present

## 2020-01-14 DIAGNOSIS — G9389 Other specified disorders of brain: Secondary | ICD-10-CM | POA: Diagnosis not present

## 2020-01-14 DIAGNOSIS — Y999 Unspecified external cause status: Secondary | ICD-10-CM | POA: Diagnosis not present

## 2020-01-14 DIAGNOSIS — R000 Tachycardia, unspecified: Secondary | ICD-10-CM | POA: Diagnosis not present

## 2020-01-14 DIAGNOSIS — R9431 Abnormal electrocardiogram [ECG] [EKG]: Secondary | ICD-10-CM | POA: Diagnosis not present

## 2020-01-14 DIAGNOSIS — W19XXXA Unspecified fall, initial encounter: Secondary | ICD-10-CM | POA: Diagnosis not present

## 2020-01-14 DIAGNOSIS — I10 Essential (primary) hypertension: Secondary | ICD-10-CM | POA: Diagnosis not present

## 2020-01-14 LAB — CBC WITH DIFFERENTIAL/PLATELET
Abs Immature Granulocytes: 0.03 10*3/uL (ref 0.00–0.07)
Basophils Absolute: 0 10*3/uL (ref 0.0–0.1)
Basophils Relative: 0 %
Eosinophils Absolute: 0.2 10*3/uL (ref 0.0–0.5)
Eosinophils Relative: 2 %
HCT: 31.7 % — ABNORMAL LOW (ref 39.0–52.0)
Hemoglobin: 10.3 g/dL — ABNORMAL LOW (ref 13.0–17.0)
Immature Granulocytes: 0 %
Lymphocytes Relative: 20 %
Lymphs Abs: 1.5 10*3/uL (ref 0.7–4.0)
MCH: 33.2 pg (ref 26.0–34.0)
MCHC: 32.5 g/dL (ref 30.0–36.0)
MCV: 102.3 fL — ABNORMAL HIGH (ref 80.0–100.0)
Monocytes Absolute: 0.6 10*3/uL (ref 0.1–1.0)
Monocytes Relative: 9 %
Neutro Abs: 5.1 10*3/uL (ref 1.7–7.7)
Neutrophils Relative %: 69 %
Platelets: 115 10*3/uL — ABNORMAL LOW (ref 150–400)
RBC: 3.1 MIL/uL — ABNORMAL LOW (ref 4.22–5.81)
RDW: 14.2 % (ref 11.5–15.5)
WBC: 7.4 10*3/uL (ref 4.0–10.5)
nRBC: 0 % (ref 0.0–0.2)

## 2020-01-14 LAB — COMPREHENSIVE METABOLIC PANEL
ALT: 15 U/L (ref 0–44)
AST: 20 U/L (ref 15–41)
Albumin: 3.6 g/dL (ref 3.5–5.0)
Alkaline Phosphatase: 58 U/L (ref 38–126)
Anion gap: 14 (ref 5–15)
BUN: 58 mg/dL — ABNORMAL HIGH (ref 6–20)
CO2: 26 mmol/L (ref 22–32)
Calcium: 8.7 mg/dL — ABNORMAL LOW (ref 8.9–10.3)
Chloride: 98 mmol/L (ref 98–111)
Creatinine, Ser: 17.11 mg/dL — ABNORMAL HIGH (ref 0.61–1.24)
GFR calc Af Amer: 4 mL/min — ABNORMAL LOW (ref >60)
GFR calc non Af Amer: 3 mL/min — ABNORMAL LOW (ref >60)
Glucose, Bld: 109 mg/dL — ABNORMAL HIGH (ref 70–99)
Potassium: 4.4 mmol/L (ref 3.5–5.1)
Sodium: 138 mmol/L (ref 135–145)
Total Bilirubin: 0.6 mg/dL (ref 0.3–1.2)
Total Protein: 7.1 g/dL (ref 6.5–8.1)

## 2020-01-14 LAB — MAGNESIUM: Magnesium: 2.6 mg/dL — ABNORMAL HIGH (ref 1.7–2.4)

## 2020-01-14 MED ORDER — ACETAMINOPHEN 325 MG PO TABS: 650.0000 mg | ORAL_TABLET | Freq: Once | ORAL | Status: DC

## 2020-01-14 NOTE — Progress Notes (Signed)
   01/14/20 0215  Clinical Encounter Type  Visited With Health care provider  Visit Type ED;Trauma  Referral From Nurse  Consult/Referral To Chaplain   Chaplain responded to level 2 trauma. Pt responsive and being evaluated by medical staff. Chaplain remains available as needs arise.   Chaplain Resident, Evelene Croon, MDiv [phone] on-call pager

## 2020-01-14 NOTE — ED Triage Notes (Signed)
Pt. BIB [facility] from work. Had a seizure at work. Pt. feel hit his head. Hematoma to left occipital head. No mouth trauma. Pt does not recall event. Neuro intact. A&Ox4.  Denies fever/cough. Denies recent illness.   HX seizure states complaint with medications.

## 2020-01-14 NOTE — ED Provider Notes (Signed)
[facility] EMERGENCY DEPARTMENT Provider Note   CSN: [id number] Arrival date & time: 01/14/20  2010     History Chief Complaint  Patient presents with  . Trauma    Fall on Thinners  . Seizures    [name] [name] is a 39 y.o. male.  HPI 38-male presents with seizure and head injury.  The patient was at work when a coworker saw him have a seizure.  The patient has a known history of seizures.  He has been compliant with his lamotrigine, which he took this morning.  He is also on warfarin.  He has a scalp hematoma.  He does not member any events just prior to the seizure or any obvious aura.  He has a headache since the seizure and fall.  EMS notes a tongue injury but the patient states that is chronic.  No bleeding.  No preceding illness.   No past medical history on file.  There are no problems to display for this patient.        No family history on file.  Social History   Tobacco Use  . Smoking status: Not on file  Substance Use Topics  . Alcohol use: Not on file  . Drug use: Not on file    Home Medications Prior to Admission medications   Not on File    Allergies    Zyrtec [cetirizine]  Review of Systems   Review of Systems  Constitutional: Negative for fever.  Respiratory: Negative for shortness of breath.   Cardiovascular: Negative for chest pain.  Gastrointestinal: Negative for abdominal pain, diarrhea and vomiting.  Musculoskeletal: Negative for neck pain.  Neurological: Positive for seizures and headaches. Negative for weakness and numbness.  All other systems reviewed and are negative.   Physical Exam Updated Vital Signs BP (!) 152/98   Pulse 97   Temp 98.2 F (36.8 C) (Oral)   Resp 17   Ht 6\' 1"  (1.854 m)   Wt (!) 113.4 kg   SpO2 92%   BMI 32.98 kg/m   Physical Exam Vitals and nursing note reviewed.  Constitutional:      General: He is not in acute distress.    Appearance: He is well-developed. He is not  ill-appearing or diaphoretic.  HENT:     Head: Normocephalic. Contusion present.      Comments: Left lateral tongue with chronic wound    Right Ear: External ear normal.     Left Ear: External ear normal.     Nose: Nose normal.  Eyes:     General:        Right eye: No discharge.        Left eye: No discharge.     Extraocular Movements: Extraocular movements intact.     Pupils: Pupils are equal, round, and reactive to light.  Cardiovascular:     Rate and Rhythm: Normal rate and regular rhythm.     Heart sounds: Normal heart sounds.  Pulmonary:     Effort: Pulmonary effort is normal.     Breath sounds: Normal breath sounds.  Abdominal:     Palpations: Abdomen is soft.     Tenderness: There is no abdominal tenderness.  Musculoskeletal:     Cervical back: Neck supple.  Skin:    General: Skin is warm and dry.  Neurological:     Mental Status: He is alert.     Comments: CN 3-12 grossly intact. 5/5 strength in all 4 extremities. Grossly normal sensation. Normal  finger to nose.   Psychiatric:        Mood and Affect: Mood is not anxious.     ED Results / Procedures / Treatments   Labs (all labs ordered are listed, but only abnormal results are displayed) Labs Reviewed  COMPREHENSIVE METABOLIC PANEL - Abnormal; Notable for the following components:      Result Value   Glucose, Bld 109 (*)    BUN 58 (*)    Creatinine, Ser 17.11 (*)    Calcium 8.7 (*)    GFR calc non Af Amer 3 (*)    GFR calc Af Amer 4 (*)    All other components within normal limits  CBC WITH DIFFERENTIAL/PLATELET - Abnormal; Notable for the following components:   RBC 3.10 (*)    Hemoglobin 10.3 (*)    HCT 31.7 (*)    MCV 102.3 (*)    Platelets 115 (*)    All other components within normal limits  MAGNESIUM - Abnormal; Notable for the following components:   Magnesium 2.6 (*)    All other components within normal limits  LAMOTRIGINE LEVEL    EKG EKG Interpretation  Date/Time:  Thursday January 14 2020 20:19:47 EDT Ventricular Rate:  97 PR Interval:    QRS Duration: 111 QT Interval:  372 QTC Calculation: 473 R Axis:   31 Text Interpretation: Sinus rhythm Left atrial enlargement Probable right ventricular hypertrophy No old tracing to compare Confirmed by Sherwood Gambler [phone]) on 01/14/2020 8:34:59 PM   Radiology CT Head Wo Contrast  Result Date: 01/14/2020 CLINICAL DATA:  Seizure EXAM: CT HEAD WITHOUT CONTRAST TECHNIQUE: Contiguous axial images were obtained from the base of the skull through the vertex without intravenous contrast. COMPARISON:  None. FINDINGS: Brain: No acute territorial infarction, hemorrhage or intracranial mass. Moderate atrophy. Encephalomalacia within the left parietal lobe. Additional small foci of encephalomalacia within the right posterior frontal subcortical white matter, right parietal white matter, and right parietooccipital lobe. Small chronic appearing infarct in the left cerebellum and left caudate. Moderate hypodensity in the white matter consistent with chronic small vessel ischemic change. Mildly prominent ventricles felt secondary to atrophy. Vascular: No hyperdense vessels.  No unexpected calcification. Skull: Normal. Negative for fracture or focal lesion. Sinuses/Orbits: Mucosal thickening in the maxillary and ethmoid sinuses. Left maxillary dental disease with prominent root lucency at the premolar and molar teeth. Dental caries at the left molar teeth. Other: Moderate to large left posterior parietal scalp hematoma IMPRESSION: 1. No CT evidence for acute intracranial abnormality. Large left posterior scalp hematoma. 2. Atrophy and chronic small vessel ischemic changes of the white matter. Multifocal encephalomalacia consistent with chronic infarcts. 3. Left maxillary dental disease. Electronically Signed   By: Donavan Foil M.D.   On: 01/14/2020 20:55    Procedures Procedures (including critical care time)  Medications Ordered in ED Medications    acetaminophen (TYLENOL) tablet 650 mg (has no administration in time range)    ED Course  I have reviewed the triage vital signs and the nursing notes.  Pertinent labs & imaging results that were available during my care of the patient were reviewed by me and considered in my medical decision making (see chart for details).    MDM Rules/Calculators/A&P                          Patient is well-appearing.  He does have a scalp hematoma but otherwise appears well.  Tongue injury is chronic.  Seems like he had a breakthrough seizure tonight.  He otherwise has chronic kidney disease but is not due for dialysis until tomorrow.  CT head personally reviewed along with the labs and these are unrevealing besides the scalp hematoma and chronic kidney disease.  Stable for discharge home. Final Clinical Impression(s) / ED Diagnoses Final diagnoses:  Seizure ([place])  Contusion of scalp, initial encounter    Rx / DC Orders ED Discharge Orders    None       Sherwood Gambler, MD 01/14/20 2126

## 2020-01-14 NOTE — ED Notes (Signed)
Discharge instructions discussed with pt. Pt verbalized understanding with no questions at this time. Pt to go home with friend at bedside. Pt to follow up with neurologist

## 2020-01-14 NOTE — Progress Notes (Signed)
Orthopedic Tech Progress Note Patient Details:  [name] [name] 06/18/1877 [id number] Level 2 trauma Patient ID: [name] [name], male   DOB: 06/18/1877, 39 y.o.   MRN: [id number]   Ellouise Newer 01/14/2020, 8:57 PM

## 2020-01-14 NOTE — Discharge Instructions (Signed)
-   According to [place] law, you can not drive unless you are seizure / syncope free for at least 6 months and under physician's care.  °  °- Please maintain precautions. Do not participate in activities where a loss of awareness could harm you or someone else. No swimming alone, no tub bathing, no hot tubs, no driving, no operating motorized vehicles (cars, ATVs, motocycles, etc), lawnmowers, power tools or firearms. No standing at heights, such as rooftops, ladders or stairs. Avoid hot objects such as stoves, heaters, open fires. Wear a helmet when riding a bicycle, scooter, skateboard, etc. and avoid areas of traffic. Set your water heater to 120 degrees or less.  °

## 2020-01-14 NOTE — ED Notes (Signed)
Patient transported to CT 

## 2020-01-15 DIAGNOSIS — N186 End stage renal disease: Secondary | ICD-10-CM | POA: Diagnosis not present

## 2020-01-15 DIAGNOSIS — Z992 Dependence on renal dialysis: Secondary | ICD-10-CM | POA: Diagnosis not present

## 2020-01-15 DIAGNOSIS — N2581 Secondary hyperparathyroidism of renal origin: Secondary | ICD-10-CM | POA: Diagnosis not present

## 2020-01-16 DIAGNOSIS — I129 Hypertensive chronic kidney disease with stage 1 through stage 4 chronic kidney disease, or unspecified chronic kidney disease: Secondary | ICD-10-CM | POA: Diagnosis not present

## 2020-01-16 DIAGNOSIS — Z992 Dependence on renal dialysis: Secondary | ICD-10-CM | POA: Diagnosis not present

## 2020-01-16 DIAGNOSIS — N186 End stage renal disease: Secondary | ICD-10-CM | POA: Diagnosis not present

## 2020-01-18 DIAGNOSIS — N2581 Secondary hyperparathyroidism of renal origin: Secondary | ICD-10-CM | POA: Diagnosis not present

## 2020-01-18 DIAGNOSIS — N186 End stage renal disease: Secondary | ICD-10-CM | POA: Diagnosis not present

## 2020-01-18 DIAGNOSIS — Z992 Dependence on renal dialysis: Secondary | ICD-10-CM | POA: Diagnosis not present

## 2020-01-18 LAB — LAMOTRIGINE LEVEL: Lamotrigine Lvl: 4.7 ug/mL (ref 2.0–20.0)

## 2020-01-20 DIAGNOSIS — Z992 Dependence on renal dialysis: Secondary | ICD-10-CM | POA: Diagnosis not present

## 2020-01-20 DIAGNOSIS — N186 End stage renal disease: Secondary | ICD-10-CM | POA: Diagnosis not present

## 2020-01-20 DIAGNOSIS — N2581 Secondary hyperparathyroidism of renal origin: Secondary | ICD-10-CM | POA: Diagnosis not present

## 2020-01-22 DIAGNOSIS — N2581 Secondary hyperparathyroidism of renal origin: Secondary | ICD-10-CM | POA: Diagnosis not present

## 2020-01-22 DIAGNOSIS — Z992 Dependence on renal dialysis: Secondary | ICD-10-CM | POA: Diagnosis not present

## 2020-01-22 DIAGNOSIS — N186 End stage renal disease: Secondary | ICD-10-CM | POA: Diagnosis not present

## 2020-01-25 DIAGNOSIS — N2581 Secondary hyperparathyroidism of renal origin: Secondary | ICD-10-CM | POA: Diagnosis not present

## 2020-01-25 DIAGNOSIS — Z992 Dependence on renal dialysis: Secondary | ICD-10-CM | POA: Diagnosis not present

## 2020-01-25 DIAGNOSIS — N186 End stage renal disease: Secondary | ICD-10-CM | POA: Diagnosis not present

## 2020-01-27 DIAGNOSIS — Z992 Dependence on renal dialysis: Secondary | ICD-10-CM | POA: Diagnosis not present

## 2020-01-27 DIAGNOSIS — N186 End stage renal disease: Secondary | ICD-10-CM | POA: Diagnosis not present

## 2020-01-27 DIAGNOSIS — N2581 Secondary hyperparathyroidism of renal origin: Secondary | ICD-10-CM | POA: Diagnosis not present

## 2020-01-29 DIAGNOSIS — Z992 Dependence on renal dialysis: Secondary | ICD-10-CM | POA: Diagnosis not present

## 2020-01-29 DIAGNOSIS — N186 End stage renal disease: Secondary | ICD-10-CM | POA: Diagnosis not present

## 2020-01-29 DIAGNOSIS — N2581 Secondary hyperparathyroidism of renal origin: Secondary | ICD-10-CM | POA: Diagnosis not present

## 2020-02-01 DIAGNOSIS — N2581 Secondary hyperparathyroidism of renal origin: Secondary | ICD-10-CM | POA: Diagnosis not present

## 2020-02-01 DIAGNOSIS — N186 End stage renal disease: Secondary | ICD-10-CM | POA: Diagnosis not present

## 2020-02-01 DIAGNOSIS — Z992 Dependence on renal dialysis: Secondary | ICD-10-CM | POA: Diagnosis not present

## 2020-02-03 DIAGNOSIS — N2581 Secondary hyperparathyroidism of renal origin: Secondary | ICD-10-CM | POA: Diagnosis not present

## 2020-02-03 DIAGNOSIS — N186 End stage renal disease: Secondary | ICD-10-CM | POA: Diagnosis not present

## 2020-02-03 DIAGNOSIS — Z992 Dependence on renal dialysis: Secondary | ICD-10-CM | POA: Diagnosis not present

## 2020-02-05 DIAGNOSIS — N2581 Secondary hyperparathyroidism of renal origin: Secondary | ICD-10-CM | POA: Diagnosis not present

## 2020-02-05 DIAGNOSIS — Z992 Dependence on renal dialysis: Secondary | ICD-10-CM | POA: Diagnosis not present

## 2020-02-05 DIAGNOSIS — N186 End stage renal disease: Secondary | ICD-10-CM | POA: Diagnosis not present

## 2020-02-08 DIAGNOSIS — Z992 Dependence on renal dialysis: Secondary | ICD-10-CM | POA: Diagnosis not present

## 2020-02-08 DIAGNOSIS — N2581 Secondary hyperparathyroidism of renal origin: Secondary | ICD-10-CM | POA: Diagnosis not present

## 2020-02-08 DIAGNOSIS — N186 End stage renal disease: Secondary | ICD-10-CM | POA: Diagnosis not present

## 2020-02-10 DIAGNOSIS — Z992 Dependence on renal dialysis: Secondary | ICD-10-CM | POA: Diagnosis not present

## 2020-02-10 DIAGNOSIS — N2581 Secondary hyperparathyroidism of renal origin: Secondary | ICD-10-CM | POA: Diagnosis not present

## 2020-02-10 DIAGNOSIS — N186 End stage renal disease: Secondary | ICD-10-CM | POA: Diagnosis not present

## 2020-02-12 DIAGNOSIS — Z992 Dependence on renal dialysis: Secondary | ICD-10-CM | POA: Diagnosis not present

## 2020-02-12 DIAGNOSIS — N2581 Secondary hyperparathyroidism of renal origin: Secondary | ICD-10-CM | POA: Diagnosis not present

## 2020-02-12 DIAGNOSIS — N186 End stage renal disease: Secondary | ICD-10-CM | POA: Diagnosis not present

## 2020-02-15 DIAGNOSIS — N2581 Secondary hyperparathyroidism of renal origin: Secondary | ICD-10-CM | POA: Diagnosis not present

## 2020-02-15 DIAGNOSIS — N186 End stage renal disease: Secondary | ICD-10-CM | POA: Diagnosis not present

## 2020-02-15 DIAGNOSIS — Z992 Dependence on renal dialysis: Secondary | ICD-10-CM | POA: Diagnosis not present

## 2020-02-16 DIAGNOSIS — N186 End stage renal disease: Secondary | ICD-10-CM | POA: Diagnosis not present

## 2020-02-16 DIAGNOSIS — Z992 Dependence on renal dialysis: Secondary | ICD-10-CM | POA: Diagnosis not present

## 2020-02-16 DIAGNOSIS — I129 Hypertensive chronic kidney disease with stage 1 through stage 4 chronic kidney disease, or unspecified chronic kidney disease: Secondary | ICD-10-CM | POA: Diagnosis not present

## 2020-02-17 DIAGNOSIS — Z992 Dependence on renal dialysis: Secondary | ICD-10-CM | POA: Diagnosis not present

## 2020-02-17 DIAGNOSIS — N2581 Secondary hyperparathyroidism of renal origin: Secondary | ICD-10-CM | POA: Diagnosis not present

## 2020-02-17 DIAGNOSIS — N186 End stage renal disease: Secondary | ICD-10-CM | POA: Diagnosis not present

## 2020-02-19 DIAGNOSIS — N186 End stage renal disease: Secondary | ICD-10-CM | POA: Diagnosis not present

## 2020-02-19 DIAGNOSIS — Z992 Dependence on renal dialysis: Secondary | ICD-10-CM | POA: Diagnosis not present

## 2020-02-19 DIAGNOSIS — N2581 Secondary hyperparathyroidism of renal origin: Secondary | ICD-10-CM | POA: Diagnosis not present

## 2020-02-19 DIAGNOSIS — Z23 Encounter for immunization: Secondary | ICD-10-CM | POA: Diagnosis not present

## 2020-02-22 DIAGNOSIS — N2581 Secondary hyperparathyroidism of renal origin: Secondary | ICD-10-CM | POA: Diagnosis not present

## 2020-02-22 DIAGNOSIS — N186 End stage renal disease: Secondary | ICD-10-CM | POA: Diagnosis not present

## 2020-02-22 DIAGNOSIS — Z992 Dependence on renal dialysis: Secondary | ICD-10-CM | POA: Diagnosis not present

## 2020-02-24 DIAGNOSIS — N2581 Secondary hyperparathyroidism of renal origin: Secondary | ICD-10-CM | POA: Diagnosis not present

## 2020-02-24 DIAGNOSIS — Z992 Dependence on renal dialysis: Secondary | ICD-10-CM | POA: Diagnosis not present

## 2020-02-24 DIAGNOSIS — N186 End stage renal disease: Secondary | ICD-10-CM | POA: Diagnosis not present

## 2020-02-26 DIAGNOSIS — Z992 Dependence on renal dialysis: Secondary | ICD-10-CM | POA: Diagnosis not present

## 2020-02-26 DIAGNOSIS — N2581 Secondary hyperparathyroidism of renal origin: Secondary | ICD-10-CM | POA: Diagnosis not present

## 2020-02-26 DIAGNOSIS — N186 End stage renal disease: Secondary | ICD-10-CM | POA: Diagnosis not present

## 2020-02-29 DIAGNOSIS — N186 End stage renal disease: Secondary | ICD-10-CM | POA: Diagnosis not present

## 2020-02-29 DIAGNOSIS — Z992 Dependence on renal dialysis: Secondary | ICD-10-CM | POA: Diagnosis not present

## 2020-02-29 DIAGNOSIS — N2581 Secondary hyperparathyroidism of renal origin: Secondary | ICD-10-CM | POA: Diagnosis not present

## 2020-03-02 DIAGNOSIS — N186 End stage renal disease: Secondary | ICD-10-CM | POA: Diagnosis not present

## 2020-03-02 DIAGNOSIS — N2581 Secondary hyperparathyroidism of renal origin: Secondary | ICD-10-CM | POA: Diagnosis not present

## 2020-03-02 DIAGNOSIS — Z992 Dependence on renal dialysis: Secondary | ICD-10-CM | POA: Diagnosis not present

## 2020-03-04 DIAGNOSIS — N186 End stage renal disease: Secondary | ICD-10-CM | POA: Diagnosis not present

## 2020-03-04 DIAGNOSIS — Z992 Dependence on renal dialysis: Secondary | ICD-10-CM | POA: Diagnosis not present

## 2020-03-04 DIAGNOSIS — N2581 Secondary hyperparathyroidism of renal origin: Secondary | ICD-10-CM | POA: Diagnosis not present

## 2020-03-07 DIAGNOSIS — Z992 Dependence on renal dialysis: Secondary | ICD-10-CM | POA: Diagnosis not present

## 2020-03-07 DIAGNOSIS — R52 Pain, unspecified: Secondary | ICD-10-CM | POA: Diagnosis not present

## 2020-03-07 DIAGNOSIS — N186 End stage renal disease: Secondary | ICD-10-CM | POA: Diagnosis not present

## 2020-03-07 DIAGNOSIS — N2581 Secondary hyperparathyroidism of renal origin: Secondary | ICD-10-CM | POA: Diagnosis not present

## 2020-03-09 DIAGNOSIS — N2581 Secondary hyperparathyroidism of renal origin: Secondary | ICD-10-CM | POA: Diagnosis not present

## 2020-03-09 DIAGNOSIS — R52 Pain, unspecified: Secondary | ICD-10-CM | POA: Diagnosis not present

## 2020-03-09 DIAGNOSIS — Z992 Dependence on renal dialysis: Secondary | ICD-10-CM | POA: Diagnosis not present

## 2020-03-09 DIAGNOSIS — N186 End stage renal disease: Secondary | ICD-10-CM | POA: Diagnosis not present

## 2020-03-11 DIAGNOSIS — N2581 Secondary hyperparathyroidism of renal origin: Secondary | ICD-10-CM | POA: Diagnosis not present

## 2020-03-11 DIAGNOSIS — N186 End stage renal disease: Secondary | ICD-10-CM | POA: Diagnosis not present

## 2020-03-11 DIAGNOSIS — Z992 Dependence on renal dialysis: Secondary | ICD-10-CM | POA: Diagnosis not present

## 2020-03-11 DIAGNOSIS — R52 Pain, unspecified: Secondary | ICD-10-CM | POA: Diagnosis not present

## 2020-03-14 DIAGNOSIS — N2581 Secondary hyperparathyroidism of renal origin: Secondary | ICD-10-CM | POA: Diagnosis not present

## 2020-03-14 DIAGNOSIS — N186 End stage renal disease: Secondary | ICD-10-CM | POA: Diagnosis not present

## 2020-03-14 DIAGNOSIS — Z992 Dependence on renal dialysis: Secondary | ICD-10-CM | POA: Diagnosis not present

## 2020-03-14 DIAGNOSIS — Z23 Encounter for immunization: Secondary | ICD-10-CM | POA: Diagnosis not present

## 2020-03-16 DIAGNOSIS — Z992 Dependence on renal dialysis: Secondary | ICD-10-CM | POA: Diagnosis not present

## 2020-03-16 DIAGNOSIS — N2581 Secondary hyperparathyroidism of renal origin: Secondary | ICD-10-CM | POA: Diagnosis not present

## 2020-03-16 DIAGNOSIS — N186 End stage renal disease: Secondary | ICD-10-CM | POA: Diagnosis not present

## 2020-03-17 DIAGNOSIS — I129 Hypertensive chronic kidney disease with stage 1 through stage 4 chronic kidney disease, or unspecified chronic kidney disease: Secondary | ICD-10-CM | POA: Diagnosis not present

## 2020-03-17 DIAGNOSIS — Z992 Dependence on renal dialysis: Secondary | ICD-10-CM | POA: Diagnosis not present

## 2020-03-17 DIAGNOSIS — N186 End stage renal disease: Secondary | ICD-10-CM | POA: Diagnosis not present

## 2020-03-18 DIAGNOSIS — Z992 Dependence on renal dialysis: Secondary | ICD-10-CM | POA: Diagnosis not present

## 2020-03-18 DIAGNOSIS — N186 End stage renal disease: Secondary | ICD-10-CM | POA: Diagnosis not present

## 2020-03-18 DIAGNOSIS — N2581 Secondary hyperparathyroidism of renal origin: Secondary | ICD-10-CM | POA: Diagnosis not present

## 2020-03-21 DIAGNOSIS — Z992 Dependence on renal dialysis: Secondary | ICD-10-CM | POA: Diagnosis not present

## 2020-03-21 DIAGNOSIS — N2581 Secondary hyperparathyroidism of renal origin: Secondary | ICD-10-CM | POA: Diagnosis not present

## 2020-03-21 DIAGNOSIS — N186 End stage renal disease: Secondary | ICD-10-CM | POA: Diagnosis not present

## 2020-03-23 DIAGNOSIS — N186 End stage renal disease: Secondary | ICD-10-CM | POA: Diagnosis not present

## 2020-03-23 DIAGNOSIS — N2581 Secondary hyperparathyroidism of renal origin: Secondary | ICD-10-CM | POA: Diagnosis not present

## 2020-03-23 DIAGNOSIS — Z992 Dependence on renal dialysis: Secondary | ICD-10-CM | POA: Diagnosis not present

## 2020-03-25 DIAGNOSIS — N2581 Secondary hyperparathyroidism of renal origin: Secondary | ICD-10-CM | POA: Diagnosis not present

## 2020-03-25 DIAGNOSIS — Z992 Dependence on renal dialysis: Secondary | ICD-10-CM | POA: Diagnosis not present

## 2020-03-25 DIAGNOSIS — N186 End stage renal disease: Secondary | ICD-10-CM | POA: Diagnosis not present

## 2020-03-28 DIAGNOSIS — Z992 Dependence on renal dialysis: Secondary | ICD-10-CM | POA: Diagnosis not present

## 2020-03-28 DIAGNOSIS — N2581 Secondary hyperparathyroidism of renal origin: Secondary | ICD-10-CM | POA: Diagnosis not present

## 2020-03-28 DIAGNOSIS — N186 End stage renal disease: Secondary | ICD-10-CM | POA: Diagnosis not present

## 2020-03-30 DIAGNOSIS — Z992 Dependence on renal dialysis: Secondary | ICD-10-CM | POA: Diagnosis not present

## 2020-03-30 DIAGNOSIS — N186 End stage renal disease: Secondary | ICD-10-CM | POA: Diagnosis not present

## 2020-03-30 DIAGNOSIS — N2581 Secondary hyperparathyroidism of renal origin: Secondary | ICD-10-CM | POA: Diagnosis not present

## 2020-04-01 DIAGNOSIS — N2581 Secondary hyperparathyroidism of renal origin: Secondary | ICD-10-CM | POA: Diagnosis not present

## 2020-04-01 DIAGNOSIS — N186 End stage renal disease: Secondary | ICD-10-CM | POA: Diagnosis not present

## 2020-04-01 DIAGNOSIS — Z992 Dependence on renal dialysis: Secondary | ICD-10-CM | POA: Diagnosis not present

## 2020-04-04 DIAGNOSIS — N186 End stage renal disease: Secondary | ICD-10-CM | POA: Diagnosis not present

## 2020-04-04 DIAGNOSIS — Z992 Dependence on renal dialysis: Secondary | ICD-10-CM | POA: Diagnosis not present

## 2020-04-04 DIAGNOSIS — N2581 Secondary hyperparathyroidism of renal origin: Secondary | ICD-10-CM | POA: Diagnosis not present

## 2020-04-05 IMAGING — DX PORTABLE CHEST - 1 VIEW
1 series · 1 of 1 positions shown · non-contrast
Comparison: 12/21/2017

CLINICAL DATA: End-stage renal disease

EXAM:
PORTABLE CHEST 1 VIEW

[chest ap]
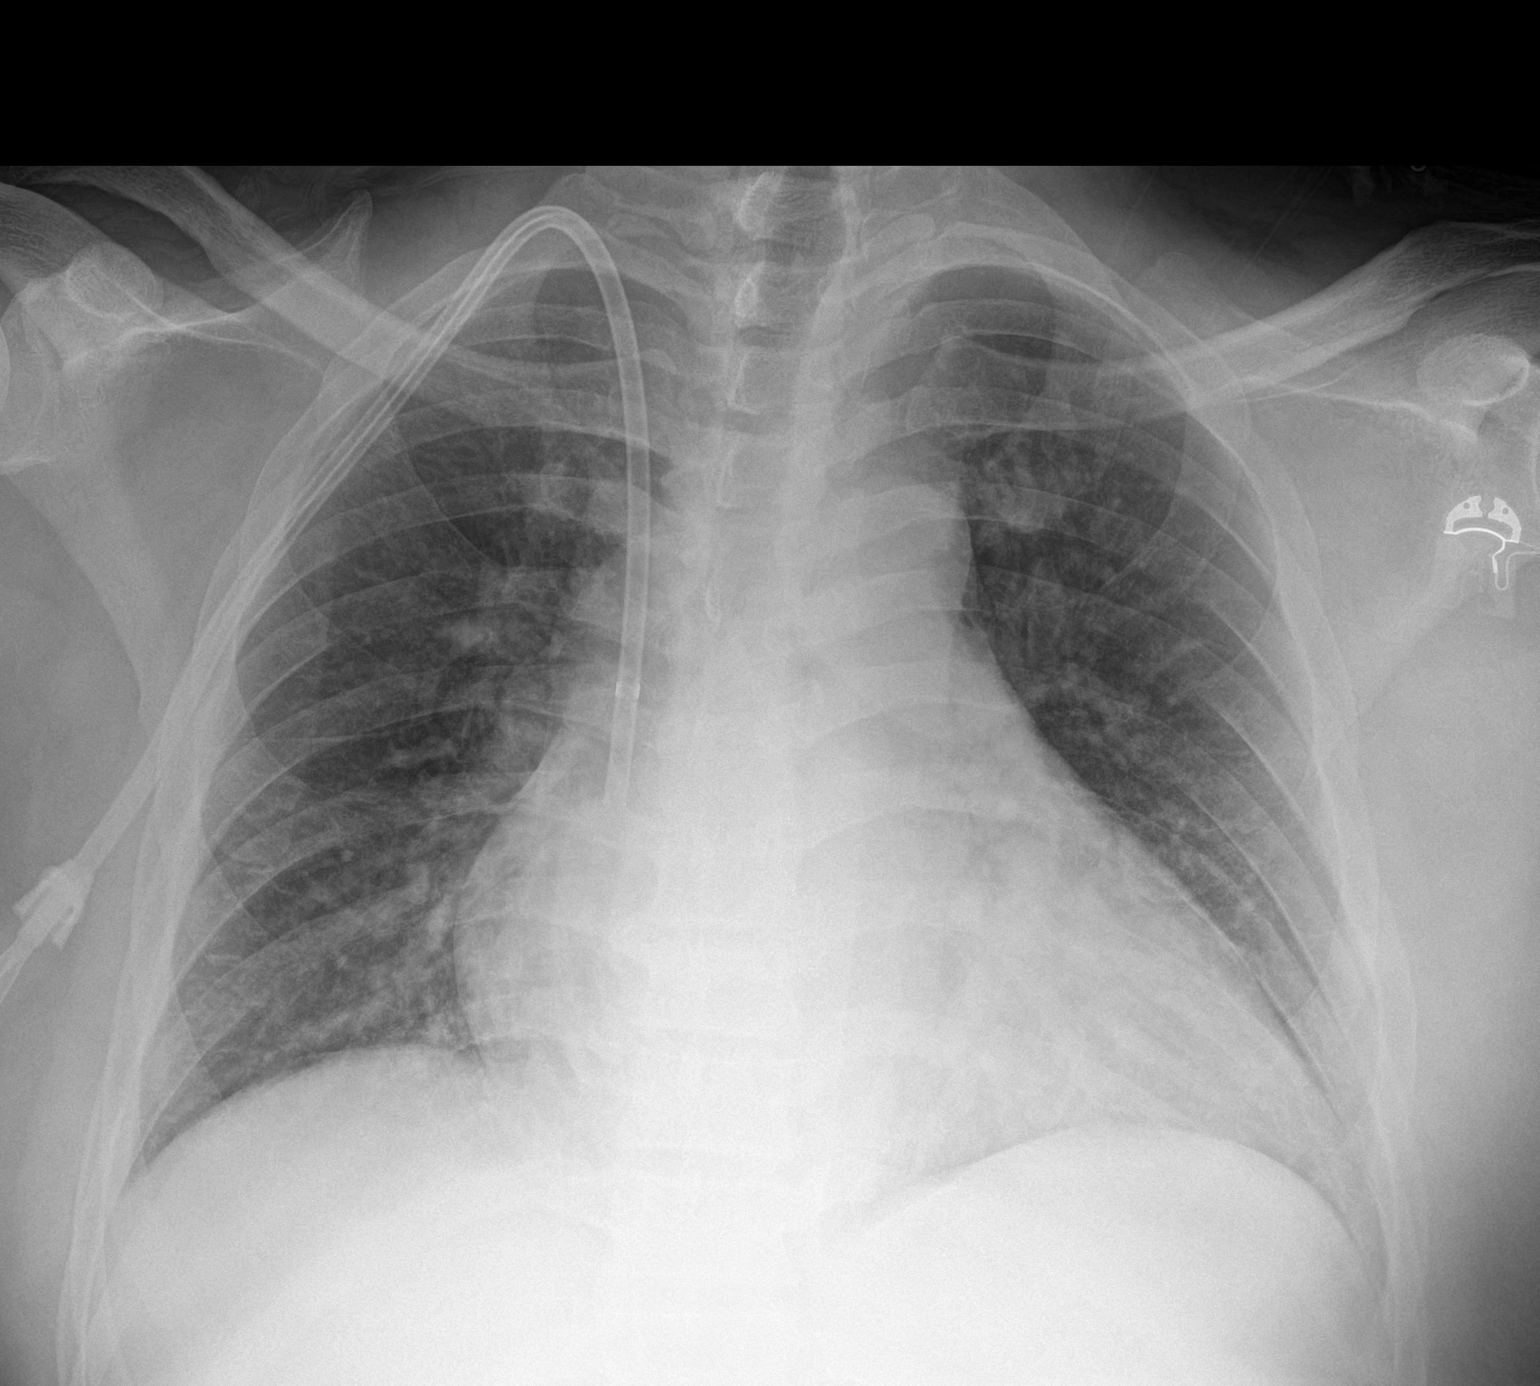

[1 of 1 positions shown; findings below may reference images not displayed]

FINDINGS: Cardiopericardial enlargement with vascular pedicle widening.
Vascular congestion without Kerley lines or pleural effusion. No air
bronchogram. Dialysis catheter with tip at the upper cavoatrial
junction.
IMPRESSION: Cardiomegaly and vascular congestion.

## 2020-04-06 DIAGNOSIS — N186 End stage renal disease: Secondary | ICD-10-CM | POA: Diagnosis not present

## 2020-04-06 DIAGNOSIS — Z992 Dependence on renal dialysis: Secondary | ICD-10-CM | POA: Diagnosis not present

## 2020-04-06 DIAGNOSIS — N2581 Secondary hyperparathyroidism of renal origin: Secondary | ICD-10-CM | POA: Diagnosis not present

## 2020-04-08 DIAGNOSIS — Z992 Dependence on renal dialysis: Secondary | ICD-10-CM | POA: Diagnosis not present

## 2020-04-08 DIAGNOSIS — N186 End stage renal disease: Secondary | ICD-10-CM | POA: Diagnosis not present

## 2020-04-08 DIAGNOSIS — N2581 Secondary hyperparathyroidism of renal origin: Secondary | ICD-10-CM | POA: Diagnosis not present

## 2020-04-13 ENCOUNTER — Telehealth: Payer: Self-pay | Admitting: Nurse Practitioner

## 2020-04-13 DIAGNOSIS — N2581 Secondary hyperparathyroidism of renal origin: Secondary | ICD-10-CM | POA: Diagnosis not present

## 2020-04-13 DIAGNOSIS — N186 End stage renal disease: Secondary | ICD-10-CM | POA: Diagnosis not present

## 2020-04-13 DIAGNOSIS — Z992 Dependence on renal dialysis: Secondary | ICD-10-CM | POA: Diagnosis not present

## 2020-04-15 DIAGNOSIS — N2581 Secondary hyperparathyroidism of renal origin: Secondary | ICD-10-CM | POA: Diagnosis not present

## 2020-04-15 DIAGNOSIS — Z992 Dependence on renal dialysis: Secondary | ICD-10-CM | POA: Diagnosis not present

## 2020-04-15 DIAGNOSIS — N186 End stage renal disease: Secondary | ICD-10-CM | POA: Diagnosis not present

## 2020-04-15 MED FILL — lamoTRIgine ER 300 MG TB24: 300 | 90 days supply | Qty: 90 | Fill #2

## 2020-04-17 DIAGNOSIS — N186 End stage renal disease: Secondary | ICD-10-CM | POA: Diagnosis not present

## 2020-04-17 DIAGNOSIS — Z992 Dependence on renal dialysis: Secondary | ICD-10-CM | POA: Diagnosis not present

## 2020-04-17 DIAGNOSIS — I129 Hypertensive chronic kidney disease with stage 1 through stage 4 chronic kidney disease, or unspecified chronic kidney disease: Secondary | ICD-10-CM | POA: Diagnosis not present

## 2020-04-18 DIAGNOSIS — N2581 Secondary hyperparathyroidism of renal origin: Secondary | ICD-10-CM | POA: Diagnosis not present

## 2020-04-18 DIAGNOSIS — Z992 Dependence on renal dialysis: Secondary | ICD-10-CM | POA: Diagnosis not present

## 2020-04-18 DIAGNOSIS — R52 Pain, unspecified: Secondary | ICD-10-CM | POA: Diagnosis not present

## 2020-04-18 DIAGNOSIS — N186 End stage renal disease: Secondary | ICD-10-CM | POA: Diagnosis not present

## 2020-04-22 DIAGNOSIS — Z992 Dependence on renal dialysis: Secondary | ICD-10-CM | POA: Diagnosis not present

## 2020-04-22 DIAGNOSIS — N186 End stage renal disease: Secondary | ICD-10-CM | POA: Diagnosis not present

## 2020-04-22 DIAGNOSIS — N2581 Secondary hyperparathyroidism of renal origin: Secondary | ICD-10-CM | POA: Diagnosis not present

## 2020-04-22 DIAGNOSIS — R52 Pain, unspecified: Secondary | ICD-10-CM | POA: Diagnosis not present

## 2020-04-25 DIAGNOSIS — N2581 Secondary hyperparathyroidism of renal origin: Secondary | ICD-10-CM | POA: Diagnosis not present

## 2020-04-25 DIAGNOSIS — Z992 Dependence on renal dialysis: Secondary | ICD-10-CM | POA: Diagnosis not present

## 2020-04-25 DIAGNOSIS — Z23 Encounter for immunization: Secondary | ICD-10-CM | POA: Diagnosis not present

## 2020-04-25 DIAGNOSIS — N186 End stage renal disease: Secondary | ICD-10-CM | POA: Diagnosis not present

## 2020-04-29 ENCOUNTER — Encounter (HOSPITAL_COMMUNITY): Payer: Self-pay

## 2020-04-29 ENCOUNTER — Emergency Department (HOSPITAL_COMMUNITY)
Admission: EM | Admit: 2020-04-29 | Discharge: 2020-04-29 | Disposition: A | Discharge status: Home / Self Care | Payer: BC Managed Care – PPO | Attending: Emergency Medicine | Admitting: Emergency Medicine

## 2020-04-29 DIAGNOSIS — M3210 Systemic lupus erythematosus, organ or system involvement unspecified: Secondary | ICD-10-CM | POA: Insufficient documentation

## 2020-04-29 DIAGNOSIS — N2581 Secondary hyperparathyroidism of renal origin: Secondary | ICD-10-CM | POA: Diagnosis not present

## 2020-04-29 DIAGNOSIS — Z87891 Personal history of nicotine dependence: Secondary | ICD-10-CM | POA: Diagnosis not present

## 2020-04-29 DIAGNOSIS — I10 Essential (primary) hypertension: Secondary | ICD-10-CM

## 2020-04-29 DIAGNOSIS — I120 Hypertensive chronic kidney disease with stage 5 chronic kidney disease or end stage renal disease: Secondary | ICD-10-CM | POA: Insufficient documentation

## 2020-04-29 DIAGNOSIS — Z79899 Other long term (current) drug therapy: Secondary | ICD-10-CM | POA: Diagnosis not present

## 2020-04-29 DIAGNOSIS — R569 Unspecified convulsions: Secondary | ICD-10-CM | POA: Insufficient documentation

## 2020-04-29 DIAGNOSIS — N186 End stage renal disease: Secondary | ICD-10-CM | POA: Diagnosis not present

## 2020-04-29 DIAGNOSIS — Z992 Dependence on renal dialysis: Secondary | ICD-10-CM | POA: Diagnosis not present

## 2020-04-29 DIAGNOSIS — R000 Tachycardia, unspecified: Secondary | ICD-10-CM | POA: Diagnosis not present

## 2020-04-29 DIAGNOSIS — Z23 Encounter for immunization: Secondary | ICD-10-CM | POA: Diagnosis not present

## 2020-04-29 LAB — CBC WITH DIFFERENTIAL/PLATELET
Abs Immature Granulocytes: 0.04 10*3/uL (ref 0.00–0.07)
Basophils Absolute: 0.1 10*3/uL (ref 0.0–0.1)
Basophils Relative: 1 %
Eosinophils Absolute: 0.3 10*3/uL (ref 0.0–0.5)
Eosinophils Relative: 2 %
HCT: 37.1 % — ABNORMAL LOW (ref 39.0–52.0)
Hemoglobin: 11.8 g/dL — ABNORMAL LOW (ref 13.0–17.0)
Immature Granulocytes: 0 %
Lymphocytes Relative: 7 %
Lymphs Abs: 0.7 10*3/uL (ref 0.7–4.0)
MCH: 31.5 pg (ref 26.0–34.0)
MCHC: 31.8 g/dL (ref 30.0–36.0)
MCV: 98.9 fL (ref 80.0–100.0)
Monocytes Absolute: 1 10*3/uL (ref 0.1–1.0)
Monocytes Relative: 9 %
Neutro Abs: 8.6 10*3/uL — ABNORMAL HIGH (ref 1.7–7.7)
Neutrophils Relative %: 81 %
Platelets: 171 10*3/uL (ref 150–400)
RBC: 3.75 MIL/uL — ABNORMAL LOW (ref 4.22–5.81)
RDW: 14.6 % (ref 11.5–15.5)
WBC: 10.7 10*3/uL — ABNORMAL HIGH (ref 4.0–10.5)
nRBC: 0 % (ref 0.0–0.2)

## 2020-04-29 LAB — CBG MONITORING, ED: Glucose-Capillary: 108 mg/dL — ABNORMAL HIGH (ref 70–99)

## 2020-04-29 LAB — COMPREHENSIVE METABOLIC PANEL
ALT: 17 U/L (ref 0–44)
AST: 16 U/L (ref 15–41)
Albumin: 3.7 g/dL (ref 3.5–5.0)
Alkaline Phosphatase: 81 U/L (ref 38–126)
Anion gap: 17 — ABNORMAL HIGH (ref 5–15)
BUN: 34 mg/dL — ABNORMAL HIGH (ref 6–20)
CO2: 27 mmol/L (ref 22–32)
Calcium: 8.9 mg/dL (ref 8.9–10.3)
Chloride: 93 mmol/L — ABNORMAL LOW (ref 98–111)
Creatinine, Ser: 13.14 mg/dL — ABNORMAL HIGH (ref 0.61–1.24)
GFR, Estimated: 4 mL/min — ABNORMAL LOW (ref >60)
Glucose, Bld: 111 mg/dL — ABNORMAL HIGH (ref 70–99)
Potassium: 3.8 mmol/L (ref 3.5–5.1)
Sodium: 137 mmol/L (ref 135–145)
Total Bilirubin: 0.8 mg/dL (ref 0.3–1.2)
Total Protein: 8.4 g/dL — ABNORMAL HIGH (ref 6.5–8.1)

## 2020-04-29 LAB — MAGNESIUM: Magnesium: 2.3 mg/dL (ref 1.7–2.4)

## 2020-04-29 MED ORDER — AMLODIPINE BESYLATE 5 MG PO TABS
10.0000 mg | ORAL_TABLET | Freq: Once | ORAL | Status: AC
  Administered 2020-04-29: 10 mg via ORAL
  Filled 2020-04-29: qty 2

## 2020-04-29 MED ORDER — LORAZEPAM 2 MG/ML IJ SOLN: 2.0000 mg | Freq: Once | INTRAMUSCULAR | Status: AC

## 2020-04-29 MED ORDER — LORAZEPAM 2 MG/ML IJ SOLN
INTRAMUSCULAR | Status: AC
  Administered 2020-04-29: 2 mg via INTRAVENOUS
  Filled 2020-04-29: qty 1

## 2020-04-29 MED ORDER — LAMOTRIGINE 25 MG PO TABS
25.0000 mg | ORAL_TABLET | Freq: Two times a day (BID) | ORAL | 3 refills | Status: DC
Start: 2020-04-29 — End: 2020-04-29

## 2020-04-29 MED ORDER — CARVEDILOL 3.125 MG PO TABS: 1.5620 mg | ORAL_TABLET | Freq: Two times a day (BID) | ORAL | 0 refills | Status: DC

## 2020-04-29 MED ORDER — AMLODIPINE BESYLATE 10 MG PO TABS: 10.0000 mg | ORAL_TABLET | Freq: Every day | ORAL | 3 refills | Status: DC

## 2020-04-29 MED ORDER — LAMOTRIGINE 25 MG PO TABS
25.0000 mg | ORAL_TABLET | Freq: Two times a day (BID) | ORAL | 3 refills | Status: DC
Start: 2020-04-29 — End: 2020-07-25

## 2020-04-29 MED ORDER — CARVEDILOL 3.125 MG PO TABS
3.1250 mg | ORAL_TABLET | Freq: Once | ORAL | Status: AC
  Administered 2020-04-29: 3.125 mg via ORAL
  Filled 2020-04-29: qty 1

## 2020-04-29 MED ORDER — HYDROCHLOROTHIAZIDE 25 MG PO TABS: ORAL_TABLET | ORAL | 2 refills | Status: DC

## 2020-04-29 MED ORDER — LAMOTRIGINE 5 MG PO CHEW
25.0000 mg | CHEWABLE_TABLET | Freq: Once | ORAL | Status: AC
  Administered 2020-04-29: 25 mg via ORAL
  Filled 2020-04-29: qty 5

## 2020-04-29 NOTE — Discharge Instructions (Addendum)
You are seen today for seizures.  Is very important that you follow-up with a neurologist, please make an appointment with 1, the information is provided.  I want you to continue taking your Lamictal as prescribed, however I want you to increase this by taking one 25 mg tablet in the morning and  one 25 mg tablet at night in addition to your 300 mg extended release tablet.  Whenever you have a dialysis day I want you to take your extended release tablet after dialysis instead of at night.  Please come back to the emergency department for any new or worsening concerning symptoms, follow-up with your PCP.

## 2020-04-29 NOTE — Progress Notes (Signed)
Brief neuro note: Consulted by ED PA due to pt having a seizure after HD and was sent to the ED. He was given Ativan IV which interferred with exam. Discussed with Dr. Theda Sers. Noted pt to be on Lamotrigine for seizures which is dialyzed in HD which could explain the lowering of his seizure threshold. Dr. Theda Sers discussed with ED PA to continue home dose of Lamotrigine, however, change the timing of dose to after HD. Consider giving pt Lamotrigine sprinkles during his HD sessions. Pt needs outpatient appt with neurology.   Clance Boll, NP Neuro

## 2020-04-29 NOTE — ED Provider Notes (Signed)
[facility] DE[facility] Provider Note   CSN: [id number] Arrival date & time: 04/29/20  1155     History Chief Complaint  Patient presents with  . Seizures    [name] [name] is a 39 y.o. male with past medical story of CKD on dialysis Monday Wednesday Friday, hypertension, lupus, seizures on Lamictal that presents emergency department today for seizure.  Per EMS patient was at dialysis, completed dialysis, 6 L were removed and patient started seizing.  Witnessed seizure with tonic-clonic movement for about 2 minutes and then patient stopped.  When EMS arrived patient was still postictal, had been postictal for 15 minutes.  Upon my evaluation patient is alert and oriented x4.  Patient states that he was in normal health this morning before he went to dialysis, has not missed any dialysis or cut any short.  States that he has not had a fever, back pain, neck pain, chest pain, shortness of breath.  States that has been generally healthy.  Denies any fevers, cough, sick contacts.  States that he had a seizure 3 weeks ago, has been taking his lamotrigine.  Denies any drug use or alcohol use.  Per chart review patient came to the emergency room for a seizure last 7/29, at that time work-up was benign including normal CT head..  Patient has not been following up with neurology.  Patient also states that he has not picked up his blood pressure medications in 3 weeks because he has been out of them.  Denies any chest pain or shortness of breath.  Denies any headache or myalgias.    HPI     Past Medical History:  Diagnosis Date  . Chronic kidney disease   . ESRD (end stage renal disease) ([place])   . Hypertension   . Lupus ([place])   . Seizures ([place])   . Seizures ([place])   . Stroke [facility])    30 or 39 years of age.    . Wears glasses     Patient Active Problem List   Diagnosis Date Noted  . Seizure, late effect of stroke ([place]) 08/26/2019  . Acute renal failure  superimposed on chronic kidney disease ([place]) 09/20/2018  . ESRD (end stage renal disease) ([place]) 03/18/2018  . Prediabetes 01/10/2018  . ARF (acute renal failure) ([place]) 12/21/2017  . Chest pain in adult   . CKD (chronic kidney disease) stage 3, GFR 30-59 ml/min ([facility]) 10/24/2015  . Seizures ([place]) 10/24/2015  . HTN (hypertension) 01/12/2015  . Chronic anticoagulation 04/24/2011  . Lupus anticoagulant positive 04/24/2011  . OBESITY, NOS 08/15/2006  . THROMBOCYTOPENIA 08/15/2006  . TOBACCO DEPENDENCE 08/15/2006  . CVA 08/15/2006    Past Surgical History:  Procedure Laterality Date  . AV FISTULA PLACEMENT Left 12/30/2017   Procedure: ARTERIOVENOUS (AV) FISTULA CREATION VERSUS INSERTION OF ARTERIOVENOUS GRAFT LEFT ARM;  Surgeon: Rosetta Posner, MD;  Location: [place];  Service: Vascular;  Laterality: Left;  . BASCILIC VEIN TRANSPOSITION Left 08/15/2018   Procedure: LEFT FIRST STAGE BASCILIC VEIN TRANSPOSITION;  Surgeon: Rosetta Posner, MD;  Location: [place];  Service: Vascular;  Laterality: Left;  . BASCILIC VEIN TRANSPOSITION Left 10/30/2018   Procedure: INSERTION OF GORE-TEX GRAFT LEFT UPPER ARM;  Surgeon: Waynetta Sandy, MD;  Location: [place];  Service: Vascular;  Laterality: Left;  . INSERTION OF DIALYSIS CATHETER N/A 09/20/2018   Procedure: INSERTION OF TUNNELED DIALYSIS CATHETER RIGHT INTERNAL JUGULAR;  Surgeon: Marty Heck, MD;  Location: [place];  Service:  Vascular;  Laterality: N/A;  . RENAL BIOPSY         Family History  Problem Relation Age of Onset  . Diabetes Mother   . Diabetes Maternal Grandmother   . Seizures Neg Hx        not on mother's side. he doesn't know about his father's side    Social History   Tobacco Use  . Smoking status: Former Smoker    Packs/day: 0.25    Types: Cigarettes    Quit date: 12/21/2017    Years since quitting: 2.3  . Smokeless tobacco: Never Used  Vaping Use  . Vaping Use: Never used  Substance Use Topics  . Alcohol use: Not  Currently  . Drug use: No    Home Medications Prior to Admission medications   Medication Sig Start Date End Date Taking? Authorizing Provider  amLODipine (NORVASC) 10 MG tablet Take 1 tablet (10 mg total) by mouth daily. 04/29/20   Alfredia Client, PA-C  apixaban (ELIQUIS) 2.5 MG TABS tablet Take 1 tablet (2.5 mg total) by mouth 2 (two) times daily. TAKE 1 TABLET (2.5 MG TOTAL) BY MOUTH 2 (TWO) TIMES DAILY. 07/07/19 10/05/19  Gildardo Pounds, NP  atorvastatin (LIPITOR) 40 MG tablet Take 1 tablet (40 mg total) by mouth daily at 6 PM. 07/07/19 10/05/19  Gildardo Pounds, NP  calcitRIOL (ROCALTROL) 0.25 MCG capsule Take 1 capsule (0.25 mcg total) by mouth daily. 11/24/18   Fulp, Cammie, MD  calcium acetate (PHOSLO) 667 MG capsule Take 1 capsule (667 mg total) by mouth See admin instructions. Take 667 mg with each meal and each snack Patient taking differently: Take 667-2,001 mg by mouth daily.  09/23/18   Cristal Ford, DO  carvedilol (COREG) 3.125 MG tablet Take 0.5 tablets (1.562 mg total) by mouth 2 (two) times daily with a meal. 04/29/20 07/28/20  Alfredia Client, PA-C  lamoTRIgine (LAMICTAL) 25 MG tablet Take 1 tablet (25 mg total) by mouth 2 (two) times daily. 04/29/20   Alfredia Client, PA-C  LamoTRIgine 300 MG TB24 24 hour tablet Take 1 tablet (300 mg total) by mouth daily. 08/26/19 08/20/20  Melvenia Beam, MD  oxyCODONE-acetaminophen (PERCOCET) 5-325 MG tablet Take 1 tablet by mouth every 6 (six) hours as needed for severe pain. 10/30/18   Rhyne, Hulen Shouts, PA-C  silver sulfADIAZINE (SILVADENE) 1 % cream Apply to affected skin areas once per day for 10 days or until area is healed 11/24/18   Fulp, Cammie, MD  hydrochlorothiazide (HYDRODIURIL) 25 MG tablet TAKE 1 TABLET (25 MG TOTAL) BY MOUTH DAILY. 04/29/20 04/29/20  Alfredia Client, PA-C    Allergies    Zyrtec [cetirizine] and Zyrtec [cetirizine]  Review of Systems   Review of Systems  Constitutional: Negative for chills, diaphoresis, fatigue and  fever.  HENT: Negative for congestion, sore throat and trouble swallowing.   Eyes: Negative for pain and visual disturbance.  Respiratory: Negative for cough, shortness of breath and wheezing.   Cardiovascular: Negative for chest pain, palpitations and leg swelling.  Gastrointestinal: Negative for abdominal distention, abdominal pain, diarrhea, nausea and vomiting.  Genitourinary: Negative for difficulty urinating.  Musculoskeletal: Negative for back pain, neck pain and neck stiffness.  Skin: Negative for pallor.  Neurological: Positive for seizures. Negative for dizziness, speech difficulty, weakness, light-headedness, numbness and headaches.  Psychiatric/Behavioral: Negative for confusion.    Physical Exam Updated Vital Signs BP (!) 148/105   Pulse 87   Temp 99.4 F (37.4 C)   Resp 17  Ht 6\' 1"  (1.854 m)   Wt 106.6 kg   SpO2 93%   BMI 31.00 kg/m   Physical Exam Constitutional:      General: He is not in acute distress.    Appearance: Normal appearance. He is not ill-appearing, toxic-appearing or diaphoretic.  HENT:     Mouth/Throat:     Mouth: Mucous membranes are moist.     Pharynx: Oropharynx is clear.  Eyes:     General: No scleral icterus.    Extraocular Movements: Extraocular movements intact.     Pupils: Pupils are equal, round, and reactive to light.  Cardiovascular:     Rate and Rhythm: Normal rate and regular rhythm.     Pulses: Normal pulses.     Heart sounds: Normal heart sounds.  Pulmonary:     Effort: Pulmonary effort is normal. No respiratory distress.     Breath sounds: Normal breath sounds. No stridor. No wheezing, rhonchi or rales.  Chest:     Chest wall: No tenderness.  Abdominal:     General: Abdomen is flat. There is no distension.     Palpations: Abdomen is soft.     Tenderness: There is no abdominal tenderness. There is no guarding or rebound.  Musculoskeletal:        General: No swelling or tenderness. Normal range of motion.     Cervical  back: Normal range of motion and neck supple. No rigidity.     Right lower leg: No edema.     Left lower leg: No edema.  Skin:    General: Skin is warm and dry.     Capillary Refill: Capillary refill takes less than 2 seconds.     Coloration: Skin is not pale.  Neurological:     General: No focal deficit present.     Mental Status: He is alert and oriented to person, place, and time.     Motor: No weakness.     Gait: Gait normal.     Comments: Alert. Clear speech. No facial droop. CNIII-XII grossly intact. Bilateral upper and lower extremities' sensation grossly intact. 5/5 symmetric strength with grip strength and with plantar and dorsi flexion bilaterally. Normal finger to nose bilaterally. Negative pronator drift. Negative Romberg sign.    Psychiatric:        Mood and Affect: Mood normal.        Behavior: Behavior normal.     ED Results / Procedures / Treatments   Labs (all labs ordered are listed, but only abnormal results are displayed) Labs Reviewed  CBC WITH DIFFERENTIAL/PLATELET - Abnormal; Notable for the following components:      Result Value   WBC 10.7 (*)    RBC 3.75 (*)    Hemoglobin 11.8 (*)    HCT 37.1 (*)    Neutro Abs 8.6 (*)    All other components within normal limits  COMPREHENSIVE METABOLIC PANEL - Abnormal; Notable for the following components:   Chloride 93 (*)    Glucose, Bld 111 (*)    BUN 34 (*)    Creatinine, Ser 13.14 (*)    Total Protein 8.4 (*)    GFR, Estimated 4 (*)    Anion gap 17 (*)    All other components within normal limits  CBG MONITORING, ED - Abnormal; Notable for the following components:   Glucose-Capillary 108 (*)    All other components within normal limits  MAGNESIUM  CBG MONITORING, ED    EKG None  Radiology No results found.  Procedures .Critical Care Performed by: Alfredia Client, PA-C Authorized by: Alfredia Client, PA-C   Critical care provider statement:    Critical care time (minutes):  45   Critical care  was necessary to treat or prevent imminent or life-threatening deterioration of the following conditions: fistula bleed.   Critical care was time spent personally by me on the following activities:  Discussions with consultants, evaluation of patient's response to treatment, examination of patient, ordering and performing treatments and interventions, ordering and review of laboratory studies, ordering and review of radiographic studies, pulse oximetry, re-evaluation of patient's condition, obtaining history from patient or surrogate, review of old charts and vascular access procedures   (including critical care time)  Medications Ordered in ED Medications  LORazepam (ATIVAN) injection 2 mg (2 mg Intravenous Given 04/29/20 1408)  lamoTRIgine (LAMICTAL) chewable tablet 25 mg (25 mg Oral Given 04/29/20 1644)  amLODipine (NORVASC) tablet 10 mg (10 mg Oral Given 04/29/20 1848)  carvedilol (COREG) tablet 3.125 mg (3.125 mg Oral Given 04/29/20 1848)    ED Course  I have reviewed the triage vital signs and the nursing notes.  Pertinent labs & imaging results that were available during my care of the patient were reviewed by me and considered in my medical decision making (see chart for details).  Clinical Course as of Apr 30 700  Fri Apr 29, 2020  1239 Is informed by nursing that dialysis catheter was pulled and patient fistula started bleeding profusely.  Dr.Katz at bedside.  We were able to place combat gauze and wrapped with Coban, patient tolerated well.  Bleeding stopped.   [SP]    Clinical Course User Index [SP] Alfredia Client, PA-C   MDM Rules/Calculators/A&P                         [name] [name] is a 39 y.o. male with past medical story of CKD on dialysis Monday Wednesday Friday, hypertension, lupus, seizures on Lamictal that presents emergency department today for seizure.  When patient arrived, he was ANO x4 with normal neuro exam.  Stable vitals.  Will obtain labs at this  time.  120 was informed by nursing that dialysis catheter was pulled, fistula started bleeding.  Patient came to the ER with access still in from dialysis since he had seizure at that time, bleeding was controlled with gauze and Coban.  Will observe and reevaluate.  Patient denies any dizziness or pain, states that he has had episodes of bleeding after his catheter had been removed, states that it normally resolves within a couple minutes.  205 was notified by nursing that patient had another seizure, was unwitnessed however patient was found to be foaming at the mouth and when I evaluated him patient was postictal.  Will give Ativan at this time.  Upon reevaluation, patient A&O x4, however sleepy after Ativan given.   230 spoke to Dr. Theda Sers, neurology who will come to assess patient. Dr. Theda Sers recommended increasing his Lamictal to 25 mg in the morning and 25 mg at night in addition to his extended release 300 mg tablet, following up with neurology.  Upon reassessment patient has not had any other bleeding from site, when I unwrapped this had completely stopped bleeding, palpable thrill noted.  Patient has not had another seizure, did give him 25 mg of Lamictal here, patient is ANO x4, normal neuro exam with normal gait.Patient has not been observed for 6 and half hours, bleeding has been controlled.  Patient to be discharged at this time.  Have asked nursing multiple times for urine for urine drug screen, however has not been able to obtain this.  Blood pressure medications given for blood pressure.   Doubt need for further emergent work up at this time. I explained the diagnosis and have given explicit precautions to return to the ER including for any other new or worsening symptoms. The patient understands and accepts the medical plan as it's been dictated and I have answered their questions. Discharge instructions concerning home care and prescriptions have been given. The patient is STABLE and  is discharged to home in good condition.  Pressure medications refilled, however it would only let me refill carvedilol and amlodipine, kept saying that HCTZ was contraindicated, did express to patient that he needs to follow-up with PCP in regards to his blood pressure medication control.  Education expressed in depth in regards to when to take Lamictal, since patient is most likely having seizures due to Lamictal being dialyzed out, patient expressed understanding.  Neuro referral placed.  I discussed this case with my attending physician who cosigned this note including patient's presenting symptoms, physical exam, and planned diagnostics and interventions. Attending physician stated agreement with plan or made changes to plan which were implemented.   Attending physician assessed patient at bedside.  Final Clinical Impression(s) / ED Diagnoses Final diagnoses:  Seizure ([place])    Rx / DC Orders ED Discharge Orders         Ordered    Ambulatory referral to Neurology       Comments: An appointment is requested in approximately:1 week   04/29/20 1823    lamoTRIgine (LAMICTAL) 25 MG tablet  2 times daily,   Status:  Discontinued        04/29/20 1825    amLODipine (NORVASC) 10 MG tablet  Daily,   Status:  Discontinued        04/29/20 1915    hydrochlorothiazide (HYDRODIURIL) 25 MG tablet  Status:  Discontinued        04/29/20 1916    amLODipine (NORVASC) 10 MG tablet  Daily        04/29/20 1933    lamoTRIgine (LAMICTAL) 25 MG tablet  2 times daily        04/29/20 1935    carvedilol (COREG) 3.125 MG tablet  2 times daily with meals        04/29/20 1935           Alfredia Client, PA-C 04/30/20 3212    Breck Coons, MD 05/02/20 1505

## 2020-04-29 NOTE — ED Notes (Signed)
Discharge instructions discussed with pt. Pt verbalized understanding with no questions at this time. Pt to go home with family at bedside 

## 2020-04-29 NOTE — ED Notes (Signed)
Notified Patel, PA of pt's bp

## 2020-04-29 NOTE — ED Triage Notes (Signed)
Pt arrived via [facility] from dialysis for a grandmalo seizure that lasted approx 2 min. Per EMS about 30 mins prior to seizure pt bp was 60/40. After seizure pt bp was 170/100. Per EMS upon arrival pt was post tictal. Per EMS dialysis pulled 6.2 L in 3.5 hrs. Pt is A&Ox4. Pt is NSR on monitor, pt is hypoxic.

## 2020-04-29 NOTE — ED Notes (Signed)
Pt placed on 3L 02 per [place] now 98%

## 2020-04-29 NOTE — ED Notes (Signed)
Pt requests prescriptions to be sent to [facility] on [address]. PA informed. Pt updated prescription sent to pharmacy.

## 2020-05-02 DIAGNOSIS — N186 End stage renal disease: Secondary | ICD-10-CM | POA: Diagnosis not present

## 2020-05-02 DIAGNOSIS — N2581 Secondary hyperparathyroidism of renal origin: Secondary | ICD-10-CM | POA: Diagnosis not present

## 2020-05-02 DIAGNOSIS — Z992 Dependence on renal dialysis: Secondary | ICD-10-CM | POA: Diagnosis not present

## 2020-05-06 DIAGNOSIS — N186 End stage renal disease: Secondary | ICD-10-CM | POA: Diagnosis not present

## 2020-05-06 DIAGNOSIS — N2581 Secondary hyperparathyroidism of renal origin: Secondary | ICD-10-CM | POA: Diagnosis not present

## 2020-05-06 DIAGNOSIS — Z992 Dependence on renal dialysis: Secondary | ICD-10-CM | POA: Diagnosis not present

## 2020-05-10 DIAGNOSIS — Z992 Dependence on renal dialysis: Secondary | ICD-10-CM | POA: Diagnosis not present

## 2020-05-10 DIAGNOSIS — N186 End stage renal disease: Secondary | ICD-10-CM | POA: Diagnosis not present

## 2020-05-10 DIAGNOSIS — N2581 Secondary hyperparathyroidism of renal origin: Secondary | ICD-10-CM | POA: Diagnosis not present

## 2020-05-13 DIAGNOSIS — N186 End stage renal disease: Secondary | ICD-10-CM | POA: Diagnosis not present

## 2020-05-13 DIAGNOSIS — Z992 Dependence on renal dialysis: Secondary | ICD-10-CM | POA: Diagnosis not present

## 2020-05-13 DIAGNOSIS — N2581 Secondary hyperparathyroidism of renal origin: Secondary | ICD-10-CM | POA: Diagnosis not present

## 2020-05-17 DIAGNOSIS — N186 End stage renal disease: Secondary | ICD-10-CM | POA: Diagnosis not present

## 2020-05-17 DIAGNOSIS — Z992 Dependence on renal dialysis: Secondary | ICD-10-CM | POA: Diagnosis not present

## 2020-05-17 DIAGNOSIS — I129 Hypertensive chronic kidney disease with stage 1 through stage 4 chronic kidney disease, or unspecified chronic kidney disease: Secondary | ICD-10-CM | POA: Diagnosis not present

## 2020-05-18 DIAGNOSIS — T82858A Stenosis of vascular prosthetic devices, implants and grafts, initial encounter: Secondary | ICD-10-CM | POA: Diagnosis not present

## 2020-05-18 DIAGNOSIS — N186 End stage renal disease: Secondary | ICD-10-CM | POA: Diagnosis not present

## 2020-05-18 DIAGNOSIS — T82868A Thrombosis of vascular prosthetic devices, implants and grafts, initial encounter: Secondary | ICD-10-CM | POA: Diagnosis not present

## 2020-05-18 DIAGNOSIS — N2581 Secondary hyperparathyroidism of renal origin: Secondary | ICD-10-CM | POA: Diagnosis not present

## 2020-05-18 DIAGNOSIS — Z992 Dependence on renal dialysis: Secondary | ICD-10-CM | POA: Diagnosis not present

## 2020-05-20 DIAGNOSIS — N2581 Secondary hyperparathyroidism of renal origin: Secondary | ICD-10-CM | POA: Diagnosis not present

## 2020-05-20 DIAGNOSIS — Z992 Dependence on renal dialysis: Secondary | ICD-10-CM | POA: Diagnosis not present

## 2020-05-20 DIAGNOSIS — N186 End stage renal disease: Secondary | ICD-10-CM | POA: Diagnosis not present

## 2020-05-23 DIAGNOSIS — Z992 Dependence on renal dialysis: Secondary | ICD-10-CM | POA: Diagnosis not present

## 2020-05-23 DIAGNOSIS — N186 End stage renal disease: Secondary | ICD-10-CM | POA: Diagnosis not present

## 2020-05-23 DIAGNOSIS — N2581 Secondary hyperparathyroidism of renal origin: Secondary | ICD-10-CM | POA: Diagnosis not present

## 2020-05-25 DIAGNOSIS — Z992 Dependence on renal dialysis: Secondary | ICD-10-CM | POA: Diagnosis not present

## 2020-05-25 DIAGNOSIS — N186 End stage renal disease: Secondary | ICD-10-CM | POA: Diagnosis not present

## 2020-05-25 DIAGNOSIS — N2581 Secondary hyperparathyroidism of renal origin: Secondary | ICD-10-CM | POA: Diagnosis not present

## 2020-05-27 DIAGNOSIS — Z992 Dependence on renal dialysis: Secondary | ICD-10-CM | POA: Diagnosis not present

## 2020-05-27 DIAGNOSIS — N2581 Secondary hyperparathyroidism of renal origin: Secondary | ICD-10-CM | POA: Diagnosis not present

## 2020-05-27 DIAGNOSIS — N186 End stage renal disease: Secondary | ICD-10-CM | POA: Diagnosis not present

## 2020-05-30 DIAGNOSIS — Z992 Dependence on renal dialysis: Secondary | ICD-10-CM | POA: Diagnosis not present

## 2020-05-30 DIAGNOSIS — N2581 Secondary hyperparathyroidism of renal origin: Secondary | ICD-10-CM | POA: Diagnosis not present

## 2020-05-30 DIAGNOSIS — N186 End stage renal disease: Secondary | ICD-10-CM | POA: Diagnosis not present

## 2020-06-01 DIAGNOSIS — N186 End stage renal disease: Secondary | ICD-10-CM | POA: Diagnosis not present

## 2020-06-01 DIAGNOSIS — N2581 Secondary hyperparathyroidism of renal origin: Secondary | ICD-10-CM | POA: Diagnosis not present

## 2020-06-01 DIAGNOSIS — Z992 Dependence on renal dialysis: Secondary | ICD-10-CM | POA: Diagnosis not present

## 2020-06-03 DIAGNOSIS — Z992 Dependence on renal dialysis: Secondary | ICD-10-CM | POA: Diagnosis not present

## 2020-06-03 DIAGNOSIS — N186 End stage renal disease: Secondary | ICD-10-CM | POA: Diagnosis not present

## 2020-06-03 DIAGNOSIS — N2581 Secondary hyperparathyroidism of renal origin: Secondary | ICD-10-CM | POA: Diagnosis not present

## 2020-06-06 DIAGNOSIS — N186 End stage renal disease: Secondary | ICD-10-CM | POA: Diagnosis not present

## 2020-06-06 DIAGNOSIS — N2581 Secondary hyperparathyroidism of renal origin: Secondary | ICD-10-CM | POA: Diagnosis not present

## 2020-06-06 DIAGNOSIS — Z992 Dependence on renal dialysis: Secondary | ICD-10-CM | POA: Diagnosis not present

## 2020-06-08 DIAGNOSIS — N186 End stage renal disease: Secondary | ICD-10-CM | POA: Diagnosis not present

## 2020-06-08 DIAGNOSIS — N2581 Secondary hyperparathyroidism of renal origin: Secondary | ICD-10-CM | POA: Diagnosis not present

## 2020-06-08 DIAGNOSIS — Z992 Dependence on renal dialysis: Secondary | ICD-10-CM | POA: Diagnosis not present

## 2020-06-10 DIAGNOSIS — N186 End stage renal disease: Secondary | ICD-10-CM | POA: Diagnosis not present

## 2020-06-10 DIAGNOSIS — Z992 Dependence on renal dialysis: Secondary | ICD-10-CM | POA: Diagnosis not present

## 2020-06-10 DIAGNOSIS — N2581 Secondary hyperparathyroidism of renal origin: Secondary | ICD-10-CM | POA: Diagnosis not present

## 2020-06-13 DIAGNOSIS — N186 End stage renal disease: Secondary | ICD-10-CM | POA: Diagnosis not present

## 2020-06-13 DIAGNOSIS — N2581 Secondary hyperparathyroidism of renal origin: Secondary | ICD-10-CM | POA: Diagnosis not present

## 2020-06-13 DIAGNOSIS — Z992 Dependence on renal dialysis: Secondary | ICD-10-CM | POA: Diagnosis not present

## 2020-06-15 DIAGNOSIS — N186 End stage renal disease: Secondary | ICD-10-CM | POA: Diagnosis not present

## 2020-06-15 DIAGNOSIS — N2581 Secondary hyperparathyroidism of renal origin: Secondary | ICD-10-CM | POA: Diagnosis not present

## 2020-06-15 DIAGNOSIS — Z992 Dependence on renal dialysis: Secondary | ICD-10-CM | POA: Diagnosis not present

## 2020-06-17 DIAGNOSIS — N2581 Secondary hyperparathyroidism of renal origin: Secondary | ICD-10-CM | POA: Diagnosis not present

## 2020-06-17 DIAGNOSIS — N186 End stage renal disease: Secondary | ICD-10-CM | POA: Diagnosis not present

## 2020-06-17 DIAGNOSIS — I129 Hypertensive chronic kidney disease with stage 1 through stage 4 chronic kidney disease, or unspecified chronic kidney disease: Secondary | ICD-10-CM | POA: Diagnosis not present

## 2020-06-17 DIAGNOSIS — Z992 Dependence on renal dialysis: Secondary | ICD-10-CM | POA: Diagnosis not present

## 2020-07-20 ENCOUNTER — Emergency Department (HOSPITAL_COMMUNITY): Payer: No Typology Code available for payment source

## 2020-07-20 ENCOUNTER — Other Ambulatory Visit: Payer: Self-pay

## 2020-07-20 ENCOUNTER — Inpatient Hospital Stay (HOSPITAL_COMMUNITY): Payer: No Typology Code available for payment source

## 2020-07-20 ENCOUNTER — Inpatient Hospital Stay (HOSPITAL_COMMUNITY)
Admission: EM | Admit: 2020-07-20 | Discharge: 2020-07-25 | DRG: 208 | Disposition: A | Discharge status: Home / Self Care | Payer: No Typology Code available for payment source | Attending: Internal Medicine | Admitting: Internal Medicine

## 2020-07-20 DIAGNOSIS — Z9119 Patient's noncompliance with other medical treatment and regimen: Secondary | ICD-10-CM

## 2020-07-20 DIAGNOSIS — N186 End stage renal disease: Secondary | ICD-10-CM | POA: Diagnosis not present

## 2020-07-20 DIAGNOSIS — Z833 Family history of diabetes mellitus: Secondary | ICD-10-CM | POA: Diagnosis not present

## 2020-07-20 DIAGNOSIS — E871 Hypo-osmolality and hyponatremia: Secondary | ICD-10-CM | POA: Diagnosis present

## 2020-07-20 DIAGNOSIS — D696 Thrombocytopenia, unspecified: Secondary | ICD-10-CM | POA: Diagnosis present

## 2020-07-20 DIAGNOSIS — N2581 Secondary hyperparathyroidism of renal origin: Secondary | ICD-10-CM | POA: Diagnosis present

## 2020-07-20 DIAGNOSIS — J1282 Pneumonia due to coronavirus disease 2019: Secondary | ICD-10-CM | POA: Diagnosis present

## 2020-07-20 DIAGNOSIS — Z7901 Long term (current) use of anticoagulants: Secondary | ICD-10-CM | POA: Diagnosis not present

## 2020-07-20 DIAGNOSIS — R0602 Shortness of breath: Secondary | ICD-10-CM

## 2020-07-20 DIAGNOSIS — I120 Hypertensive chronic kidney disease with stage 5 chronic kidney disease or end stage renal disease: Secondary | ICD-10-CM | POA: Diagnosis present

## 2020-07-20 DIAGNOSIS — Z992 Dependence on renal dialysis: Secondary | ICD-10-CM

## 2020-07-20 DIAGNOSIS — G40919 Epilepsy, unspecified, intractable, without status epilepticus: Secondary | ICD-10-CM

## 2020-07-20 DIAGNOSIS — I69398 Other sequelae of cerebral infarction: Secondary | ICD-10-CM

## 2020-07-20 DIAGNOSIS — J811 Chronic pulmonary edema: Secondary | ICD-10-CM | POA: Diagnosis present

## 2020-07-20 DIAGNOSIS — D6862 Lupus anticoagulant syndrome: Secondary | ICD-10-CM | POA: Diagnosis present

## 2020-07-20 DIAGNOSIS — Z8673 Personal history of transient ischemic attack (TIA), and cerebral infarction without residual deficits: Secondary | ICD-10-CM | POA: Diagnosis not present

## 2020-07-20 DIAGNOSIS — Z87891 Personal history of nicotine dependence: Secondary | ICD-10-CM

## 2020-07-20 DIAGNOSIS — E8770 Fluid overload, unspecified: Secondary | ICD-10-CM | POA: Diagnosis present

## 2020-07-20 DIAGNOSIS — G40909 Epilepsy, unspecified, not intractable, without status epilepticus: Secondary | ICD-10-CM | POA: Diagnosis present

## 2020-07-20 DIAGNOSIS — E875 Hyperkalemia: Secondary | ICD-10-CM | POA: Diagnosis present

## 2020-07-20 DIAGNOSIS — Z79899 Other long term (current) drug therapy: Secondary | ICD-10-CM | POA: Diagnosis not present

## 2020-07-20 DIAGNOSIS — M329 Systemic lupus erythematosus, unspecified: Secondary | ICD-10-CM | POA: Insufficient documentation

## 2020-07-20 DIAGNOSIS — E8889 Other specified metabolic disorders: Secondary | ICD-10-CM | POA: Diagnosis present

## 2020-07-20 DIAGNOSIS — R569 Unspecified convulsions: Secondary | ICD-10-CM

## 2020-07-20 DIAGNOSIS — D649 Anemia, unspecified: Secondary | ICD-10-CM | POA: Diagnosis present

## 2020-07-20 DIAGNOSIS — U071 COVID-19: Principal | ICD-10-CM | POA: Diagnosis present

## 2020-07-20 DIAGNOSIS — Z940 Kidney transplant status: Secondary | ICD-10-CM | POA: Diagnosis not present

## 2020-07-20 DIAGNOSIS — G40209 Localization-related (focal) (partial) symptomatic epilepsy and epileptic syndromes with complex partial seizures, not intractable, without status epilepticus: Secondary | ICD-10-CM | POA: Diagnosis not present

## 2020-07-20 DIAGNOSIS — J9601 Acute respiratory failure with hypoxia: Secondary | ICD-10-CM | POA: Diagnosis not present

## 2020-07-20 LAB — COMPREHENSIVE METABOLIC PANEL
ALT: 22 U/L (ref 0–44)
AST: 35 U/L (ref 15–41)
Albumin: 3.7 g/dL (ref 3.5–5.0)
Alkaline Phosphatase: 74 U/L (ref 38–126)
Anion gap: 21 — ABNORMAL HIGH (ref 5–15)
BUN: 41 mg/dL — ABNORMAL HIGH (ref 6–20)
CO2: 22 mmol/L (ref 22–32)
Calcium: 8 mg/dL — ABNORMAL LOW (ref 8.9–10.3)
Chloride: 94 mmol/L — ABNORMAL LOW (ref 98–111)
Creatinine, Ser: 12.63 mg/dL — ABNORMAL HIGH (ref 0.61–1.24)
GFR, Estimated: 5 mL/min — ABNORMAL LOW (ref >60)
Glucose, Bld: 106 mg/dL — ABNORMAL HIGH (ref 70–99)
Potassium: 3.8 mmol/L (ref 3.5–5.1)
Sodium: 137 mmol/L (ref 135–145)
Total Bilirubin: 1 mg/dL (ref 0.3–1.2)
Total Protein: 7.8 g/dL (ref 6.5–8.1)

## 2020-07-20 LAB — CBC WITH DIFFERENTIAL/PLATELET
Abs Immature Granulocytes: 0.03 10*3/uL (ref 0.00–0.07)
Basophils Absolute: 0 10*3/uL (ref 0.0–0.1)
Basophils Relative: 0 %
Eosinophils Absolute: 0 10*3/uL (ref 0.0–0.5)
Eosinophils Relative: 0 %
HCT: 33.1 % — ABNORMAL LOW (ref 39.0–52.0)
Hemoglobin: 10.8 g/dL — ABNORMAL LOW (ref 13.0–17.0)
Immature Granulocytes: 0 %
Lymphocytes Relative: 3 %
Lymphs Abs: 0.3 10*3/uL — ABNORMAL LOW (ref 0.7–4.0)
MCH: 32.6 pg (ref 26.0–34.0)
MCHC: 32.6 g/dL (ref 30.0–36.0)
MCV: 100 fL (ref 80.0–100.0)
Monocytes Absolute: 0.7 10*3/uL (ref 0.1–1.0)
Monocytes Relative: 9 %
Neutro Abs: 7.7 10*3/uL (ref 1.7–7.7)
Neutrophils Relative %: 88 %
Platelets: 76 10*3/uL — ABNORMAL LOW (ref 150–400)
RBC: 3.31 MIL/uL — ABNORMAL LOW (ref 4.22–5.81)
RDW: 14.9 % (ref 11.5–15.5)
WBC: 8.7 10*3/uL (ref 4.0–10.5)
nRBC: 0 % (ref 0.0–0.2)

## 2020-07-20 LAB — I-STAT ARTERIAL BLOOD GAS, ED
Acid-Base Excess: 7 mmol/L — ABNORMAL HIGH (ref 0.0–2.0)
Bicarbonate: 32.1 mmol/L — ABNORMAL HIGH (ref 20.0–28.0)
Calcium, Ion: 1.02 mmol/L — ABNORMAL LOW (ref 1.15–1.40)
HCT: 33 % — ABNORMAL LOW (ref 39.0–52.0)
Hemoglobin: 11.2 g/dL — ABNORMAL LOW (ref 13.0–17.0)
O2 Saturation: 98 %
Patient temperature: 105
Potassium: 3.9 mmol/L (ref 3.5–5.1)
Sodium: 136 mmol/L (ref 135–145)
TCO2: 33 mmol/L — ABNORMAL HIGH (ref 22–32)
pCO2 arterial: 52.5 mmHg — ABNORMAL HIGH (ref 32.0–48.0)
pH, Arterial: 7.408 (ref 7.350–7.450)
pO2, Arterial: 131 mmHg — ABNORMAL HIGH (ref 83.0–108.0)

## 2020-07-20 LAB — HEMOGLOBIN A1C
Hgb A1c MFr Bld: 5 % (ref 4.8–5.6)
Mean Plasma Glucose: 96.8 mg/dL

## 2020-07-20 LAB — C-REACTIVE PROTEIN: CRP: 6.2 mg/dL — ABNORMAL HIGH (ref <1.0)

## 2020-07-20 LAB — MAGNESIUM: Magnesium: 2.6 mg/dL — ABNORMAL HIGH (ref 1.7–2.4)

## 2020-07-20 LAB — FIBRINOGEN: Fibrinogen: 359 mg/dL (ref 210–475)

## 2020-07-20 LAB — I-STAT CHEM 8, ED
BUN: 44 mg/dL — ABNORMAL HIGH (ref 6–20)
Calcium, Ion: 0.89 mmol/L — CL (ref 1.15–1.40)
Chloride: 96 mmol/L — ABNORMAL LOW (ref 98–111)
Creatinine, Ser: 13.3 mg/dL — ABNORMAL HIGH (ref 0.61–1.24)
Glucose, Bld: 105 mg/dL — ABNORMAL HIGH (ref 70–99)
HCT: 34 % — ABNORMAL LOW (ref 39.0–52.0)
Hemoglobin: 11.6 g/dL — ABNORMAL LOW (ref 13.0–17.0)
Potassium: 3.8 mmol/L (ref 3.5–5.1)
Sodium: 136 mmol/L (ref 135–145)
TCO2: 25 mmol/L (ref 22–32)

## 2020-07-20 LAB — PROTIME-INR
INR: 1 (ref 0.8–1.2)
Prothrombin Time: 13.2 seconds (ref 11.4–15.2)

## 2020-07-20 LAB — CBG MONITORING, ED
Glucose-Capillary: 102 mg/dL — ABNORMAL HIGH (ref 70–99)
Glucose-Capillary: 84 mg/dL (ref 70–99)
Glucose-Capillary: 95 mg/dL (ref 70–99)

## 2020-07-20 LAB — BRAIN NATRIURETIC PEPTIDE: B Natriuretic Peptide: 1024.6 pg/mL — ABNORMAL HIGH (ref 0.0–100.0)

## 2020-07-20 LAB — ETHANOL: Alcohol, Ethyl (B): <10 mg/dL (ref <10)

## 2020-07-20 LAB — GLUCOSE, CAPILLARY: Glucose-Capillary: 114 mg/dL — ABNORMAL HIGH (ref 70–99)

## 2020-07-20 LAB — D-DIMER, QUANTITATIVE: D-Dimer, Quant: 5.03 ug/mL-FEU — ABNORMAL HIGH (ref 0.00–0.50)

## 2020-07-20 LAB — POC SARS CORONAVIRUS 2 AG -  ED: SARS Coronavirus 2 Ag: POSITIVE — AB

## 2020-07-20 LAB — LACTIC ACID, PLASMA: Lactic Acid, Venous: 1.1 mmol/L (ref 0.5–1.9)

## 2020-07-20 LAB — HIV ANTIBODY (ROUTINE TESTING W REFLEX): HIV Screen 4th Generation wRfx: NONREACTIVE

## 2020-07-20 LAB — PHOSPHORUS: Phosphorus: 4.6 mg/dL (ref 2.5–4.6)

## 2020-07-20 LAB — FERRITIN: Ferritin: 814 ng/mL — ABNORMAL HIGH (ref 24–336)

## 2020-07-20 MED ORDER — LAMOTRIGINE 100 MG PO TABS
300.0000 mg | ORAL_TABLET | Freq: Every day | ORAL | Status: DC
  Administered 2020-07-20 – 2020-07-21 (×2): 300 mg
  Filled 2020-07-20: qty 3
  Filled 2020-07-20: qty 2

## 2020-07-20 MED ORDER — FENTANYL 2500MCG IN NS 250ML (10MCG/ML) PREMIX INFUSION
50.0000 ug/h | INTRAVENOUS | Status: DC
  Administered 2020-07-20: 50 ug/h via INTRAVENOUS
  Filled 2020-07-20 (×2): qty 250

## 2020-07-20 MED ORDER — POLYETHYLENE GLYCOL 3350 17 G PO PACK: 17.0000 g | PACK | Freq: Every day | ORAL | Status: DC | PRN

## 2020-07-20 MED ORDER — INSULIN ASPART 100 UNIT/ML ~~LOC~~ SOLN: 0.0000 [IU] | SUBCUTANEOUS | Status: DC

## 2020-07-20 MED ORDER — SUCCINYLCHOLINE CHLORIDE 200 MG/10ML IV SOSY
PREFILLED_SYRINGE | INTRAVENOUS | Status: AC
  Filled 2020-07-20: qty 10

## 2020-07-20 MED ORDER — METHYLPREDNISOLONE SODIUM SUCC 125 MG IJ SOLR
0.5000 mg/kg | Freq: Two times a day (BID) | INTRAMUSCULAR | Status: DC
  Administered 2020-07-21: 56.25 mg via INTRAVENOUS
  Filled 2020-07-20: qty 2

## 2020-07-20 MED ORDER — PANTOPRAZOLE SODIUM 40 MG IV SOLR
40.0000 mg | Freq: Every day | INTRAVENOUS | Status: DC
  Administered 2020-07-20 – 2020-07-24 (×5): 40 mg via INTRAVENOUS
  Filled 2020-07-20 (×5): qty 40

## 2020-07-20 MED ORDER — PREDNISONE 20 MG PO TABS: 50.0000 mg | ORAL_TABLET | Freq: Every day | ORAL | Status: DC

## 2020-07-20 MED ORDER — DOCUSATE SODIUM 100 MG PO CAPS
100.0000 mg | ORAL_CAPSULE | Freq: Two times a day (BID) | ORAL | Status: DC | PRN
Start: 2020-07-20 — End: 2020-07-20

## 2020-07-20 MED ORDER — ETOMIDATE 2 MG/ML IV SOLN
INTRAVENOUS | Status: AC | PRN
  Administered 2020-07-20: 30 mg via INTRAVENOUS

## 2020-07-20 MED ORDER — ROCURONIUM BROMIDE 10 MG/ML (PF) SYRINGE
PREFILLED_SYRINGE | INTRAVENOUS | Status: AC
  Filled 2020-07-20: qty 10

## 2020-07-20 MED ORDER — SODIUM CHLORIDE 0.9 % IV BOLUS
500.0000 mL | Freq: Once | INTRAVENOUS | Status: AC
  Administered 2020-07-20: 500 mL via INTRAVENOUS

## 2020-07-20 MED ORDER — PROPOFOL 1000 MG/100ML IV EMUL
INTRAVENOUS | Status: AC
  Filled 2020-07-20: qty 100

## 2020-07-20 MED ORDER — FENTANYL CITRATE (PF) 100 MCG/2ML IJ SOLN
100.0000 ug | Freq: Once | INTRAMUSCULAR | Status: AC
  Administered 2020-07-20: 100 ug via INTRAVENOUS
  Filled 2020-07-20: qty 2

## 2020-07-20 MED ORDER — APIXABAN 2.5 MG PO TABS
2.5000 mg | ORAL_TABLET | Freq: Two times a day (BID) | ORAL | Status: DC
  Administered 2020-07-21: 2.5 mg
  Filled 2020-07-20: qty 1

## 2020-07-20 MED ORDER — APIXABAN 2.5 MG PO TABS
2.5000 mg | ORAL_TABLET | Freq: Two times a day (BID) | ORAL | Status: DC
  Administered 2020-07-20: 2.5 mg via ORAL
  Filled 2020-07-20: qty 1

## 2020-07-20 MED ORDER — CHLORHEXIDINE GLUCONATE CLOTH 2 % EX PADS
6.0000 | MEDICATED_PAD | Freq: Every day | CUTANEOUS | Status: DC
  Administered 2020-07-20 – 2020-07-24 (×5): 6 via TOPICAL

## 2020-07-20 MED ORDER — PROPOFOL 1000 MG/100ML IV EMUL
5.0000 ug/kg/min | INTRAVENOUS | Status: DC
  Administered 2020-07-20: 5 ug/kg/min via INTRAVENOUS
  Administered 2020-07-20 – 2020-07-21 (×2): 25 ug/kg/min via INTRAVENOUS
  Filled 2020-07-20 (×2): qty 100
  Filled 2020-07-20: qty 200

## 2020-07-20 MED ORDER — METHYLPREDNISOLONE SODIUM SUCC 125 MG IJ SOLR
0.5000 mg/kg | Freq: Two times a day (BID) | INTRAMUSCULAR | Status: DC
  Administered 2020-07-20: 56.25 mg via INTRAVENOUS
  Filled 2020-07-20: qty 2

## 2020-07-20 MED ORDER — FENTANYL BOLUS VIA INFUSION
50.0000 ug | INTRAVENOUS | Status: DC | PRN
  Filled 2020-07-20: qty 50

## 2020-07-20 MED ORDER — ROCURONIUM BROMIDE 50 MG/5ML IV SOLN
INTRAVENOUS | Status: AC | PRN
  Administered 2020-07-20: 100 mg via INTRAVENOUS

## 2020-07-20 MED ORDER — LAMOTRIGINE 25 MG PO TABS
25.0000 mg | ORAL_TABLET | Freq: Two times a day (BID) | ORAL | Status: DC
  Administered 2020-07-20 – 2020-07-21 (×2): 25 mg
  Filled 2020-07-20 (×3): qty 1

## 2020-07-20 MED ORDER — ETOMIDATE 2 MG/ML IV SOLN
INTRAVENOUS | Status: AC
  Filled 2020-07-20: qty 20

## 2020-07-20 MED ORDER — MIDAZOLAM HCL 2 MG/2ML IJ SOLN
4.0000 mg | Freq: Once | INTRAMUSCULAR | Status: AC
  Administered 2020-07-20: 4 mg via INTRAVENOUS
  Filled 2020-07-20 (×2): qty 4

## 2020-07-20 MED ORDER — FENTANYL CITRATE (PF) 100 MCG/2ML IJ SOLN
50.0000 ug | Freq: Once | INTRAMUSCULAR | Status: AC
  Administered 2020-07-20: 50 ug via INTRAVENOUS
  Filled 2020-07-20: qty 2

## 2020-07-20 MED ORDER — DOCUSATE SODIUM 50 MG/5ML PO LIQD: 100.0000 mg | Freq: Two times a day (BID) | ORAL | Status: DC | PRN

## 2020-07-20 MED ORDER — LEVETIRACETAM IN NACL 1000 MG/100ML IV SOLN
1000.0000 mg | Freq: Once | INTRAVENOUS | Status: AC
  Administered 2020-07-20: 1000 mg via INTRAVENOUS
  Filled 2020-07-20: qty 100

## 2020-07-20 MED ORDER — ACETAMINOPHEN 650 MG RE SUPP
975.0000 mg | Freq: Once | RECTAL | Status: AC
  Administered 2020-07-20: 975 mg via RECTAL
  Filled 2020-07-20: qty 1

## 2020-07-20 NOTE — Procedures (Signed)
Patient Name: [name] [name]  MRN: [id number]  Epilepsy Attending: Lora Havens  Referring Physician/Provider: Dr Kipp Brood Date: 07/19/2020  Duration: 24.55 mins  Patient history: 40yo  M wit seizure like episode. EEG to evaluate for seizure  Level of alertness:  Comatose  AEDs during EEG study: LTG, LEV, Propofol  Technical aspects: This EEG study was done with scalp electrodes positioned according to the 10-20 International system of electrode placement. Electrical activity was acquired at a sampling rate of '500Hz'$  and reviewed with a high frequency filter of '70Hz'$  and a low frequency filter of '1Hz'$ . EEG data were recorded continuously and digitally stored.   Description: EEG showed continuous generalized polymorphic mixed frequencies  With predominantly 5 to 6 Hz theta slowing admixed with intermittent generalized 2-'3Hz'$  delta slowing at times with triphasic morphology.  Intermittent 15-'18hz'$  generalized beta activity was also noted. Hyperventilation and photic stimulation were not performed.     ABNORMALITY -Continuous slow, generalized  IMPRESSION: This study is suggestive of severe diffuse encephalopathy, nonspecific etiology. No seizures or epileptiform discharges were seen throughout the recording.  Dr Lynetta Mare was notified.   Collyn Ribas Barbra Sarks

## 2020-07-20 NOTE — Progress Notes (Signed)
Pt belongings at bedside:   Car keys [place] Cell phone 2 blankets

## 2020-07-20 NOTE — ED Notes (Signed)
Lucinda Dell, [phone] for an update

## 2020-07-20 NOTE — Progress Notes (Signed)
RT NOTES: Patient transported to CT on vent and back to room ED 19 without incident.

## 2020-07-20 NOTE — Consult Note (Signed)
Neurology Consult H&P  CC: Seizure  History is obtained from: Chart as patient is intubated, sedated and with paralytic agent.  HPI: [name] [name] is a 40 y.o. male PMHx SLE, stroke (~10-15 years of age), hypertension, lupus anticoagulant + on chronic anticoagulation,obesity, ESRD s/p kidney transplant on dialysis and epilepsy (secondary to hemorrhagic stroke) with breakthrough seizures.  The following information taken from the chart: "History is from EMS as the patient is unresponsive. Patient was at dialysis and had a fever/felt hot and so they were about to check in for COVID but then he had seizures. He had at least 1 with EMS and at least one at the dialysis center. Patient was combative and postictal after awakening and was given 5 mg Versed IM and then another 2. Now he is being bagged. EMS reports a history of noncompliance but is unclear what meds he has or has not been taking."  ROS: Chart as patient is intubated, sedated and with paralytic agent.  Past Medical History:  Diagnosis Date  . Chronic kidney disease   . ESRD (end stage renal disease) ([place])   . Hypertension   . Lupus ([place])   . Seizures ([place])   . Seizures ([place])   . Stroke [facility])    2 or 40 years of age.    . Wears glasses      Family History  Problem Relation Age of Onset  . Diabetes Mother   . Diabetes Maternal Grandmother   . Seizures Neg Hx        not on mother's side. he doesn't know about his father's side    Social History:  reports that he quit smoking about 2 years ago. His smoking use included cigarettes. He smoked 0.25 packs per day. He has never used smokeless tobacco. He reports previous alcohol use. He reports that he does not use drugs.  Prior to Admission medications   Medication Sig Start Date End Date Taking? Authorizing Provider  amLODipine (NORVASC) 10 MG tablet Take 1 tablet (10 mg total) by mouth daily. 04/29/20   Alfredia Client, PA-C  apixaban (ELIQUIS) 2.5 MG TABS tablet Take 1  tablet (2.5 mg total) by mouth 2 (two) times daily. TAKE 1 TABLET (2.5 MG TOTAL) BY MOUTH 2 (TWO) TIMES DAILY. 07/07/19 10/05/19  Gildardo Pounds, NP  atorvastatin (LIPITOR) 40 MG tablet Take 1 tablet (40 mg total) by mouth daily at 6 PM. 07/07/19 10/05/19  Gildardo Pounds, NP  calcitRIOL (ROCALTROL) 0.25 MCG capsule Take 1 capsule (0.25 mcg total) by mouth daily. 11/24/18   Fulp, Cammie, MD  calcium acetate (PHOSLO) 667 MG capsule Take 1 capsule (667 mg total) by mouth See admin instructions. Take 667 mg with each meal and each snack Patient taking differently: Take 667-2,001 mg by mouth daily.  09/23/18   Cristal Ford, DO  carvedilol (COREG) 3.125 MG tablet Take 0.5 tablets (1.562 mg total) by mouth 2 (two) times daily with a meal. 04/29/20 07/28/20  Alfredia Client, PA-C  lamoTRIgine (LAMICTAL) 25 MG tablet Take 1 tablet (25 mg total) by mouth 2 (two) times daily. 04/29/20   Alfredia Client, PA-C  LamoTRIgine 300 MG TB24 24 hour tablet Take 1 tablet (300 mg total) by mouth daily. 08/26/19 08/20/20  Melvenia Beam, MD  oxyCODONE-acetaminophen (PERCOCET) 5-325 MG tablet Take 1 tablet by mouth every 6 (six) hours as needed for severe pain. 10/30/18   Rhyne, Hulen Shouts, PA-C  silver sulfADIAZINE (SILVADENE) 1 % cream Apply to affected skin areas  once per day for 10 days or until area is healed 11/24/18   Fulp, Cammie, MD  hydrochlorothiazide (HYDRODIURIL) 25 MG tablet TAKE 1 TABLET (25 MG TOTAL) BY MOUTH DAILY. 04/29/20 04/29/20  Alfredia Client, PA-C    Exam: Current vital signs: BP (!) 188/104   Pulse (!) 129   Temp (!) 105 F (40.6 C) (Rectal)   Resp 18   Ht '6\' 2"'$  (1.88 m)   Wt 113 kg   SpO2 94%   BMI 31.99 kg/m   Physical Exam  Constitutional: Appears well-developed and well-nourished.  Psych: patient is intubated, sedated and with paralytic agent. Eyes: No scleral injection HENT: No OP obstruction. Head: Normocephalic.  Cardiovascular: Normal rate and regular rhythm.  Respiratory: ventilated  with symmetric excursions bilaterally, no audible air leak/wheezing. GI: Soft.  No distension. There is no tenderness.  Skin: WDI  Neuro: Mental Status: Unable to assess as patient is intubated, sedated and with paralytic agent. Cranial Nerves: Pupils are equal, round, and sluggishly reactive to light. Doll's (+)  Motor: Tone is normal. Bulk is normal. No spontaneous movement  - patient is intubated, sedated and with paralytic agent. Sensory: Unable to assess as patient is intubated, sedated and with paralytic agent. Deep Tendon Reflexes: Mute Plantars: Mute Cerebellar: unable to assess due to intubated, sedated and with paralytic agent.  I have reviewed labs in epic and the pertinent results are: Results for MAXXON, KUKURA (MRN [id number]) as of 07/20/2020 16:41  Ref. Range 07/20/2020 13:27  BUN Latest Ref Range: 6 - 20 mg/dL 41 (H)  Creatinine Latest Ref Range: 0.61 - 1.24 mg/dL 12.63 (H)  Calcium Latest Ref Range: 8.9 - 10.3 mg/dL 8.0 (L)  Anion gap Latest Ref Range: 5 - 15  21 (H)  Phosphorus Latest Ref Range: 2.5 - 4.6 mg/dL 4.6  Magnesium Latest Ref Range: 1.7 - 2.4 mg/dL 2.6 (H)    Ref. Range 12/12/2017 16:09 09/20/2018 02:14 04/29/2019 14:41 07/07/2019 14:58 01/14/2020 20:20  Lamotrigine Ref Range: 2.0 - 20.0 ug/mL 5.2 2.1 4.9 7.2 4.7   I have reviewed the images obtained: NCT head 01/14/2020 showed Encephalomalacia within the left parietal lobe. Additional small foci of encephalomalacia within the right posterior frontal subcortical white matter, right parietal white matter, and right parietooccipital lobe. Small chronic appearing infarct in the left cerebellum and left caudate.  Chest x-ray showed ET tube in place, significant cardiomegaly and diffuse bilateral pulmonary opacities likely infiltrates vs. Edema.  Assessment: [name] [name] is a 40 y.o. male PMHx SLE, stroke (~92-40 years of age), hypertension, lupus anticoagulant + on chronic anticoagulation,obesity, ESRD s/p kidney  transplant on dialysis and epilepsy (secondary to hemorrhagic stroke) with breakthrough seizures on LMT and found to be febrile.  The chart mentions issues with medication adherence however serum LMT trend shows therapeutic serum levels albeit on the low side. It is unclear why LMT has been continued and likely for underlying comorbid psychiatric process. The outpatient care is not clear either and reluctant to make changes to LMT.  Recommend  - MRI brain without contrast when stable. - EEG to eval for any epileptogenic discharges. - Recommend metabolic/infectious workup with CBC, blood Cx, CMP, UA with UCx, CXR, CK, serum lactate. - Empiric broad spectrum antibiotic coverage for possible pneumonia. - Dosing LMT after HD or consider a post-HD supplemental dose however will need to calculate hemodialysis extraction factor with closer therapeutic drug monitoring. - Neurology will continue to follow.  This patient is critically ill and at significant risk  of neurological worsening, death and care requires constant monitoring of vital signs, hemodynamics,respiratory and cardiac monitoring, neurological assessment, discussion with family, other specialists and medical decision making of high complexity. I spent 73 minutes of neurocritical care time  in the care of  this patient. This was time spent independent of any time provided by nurse practitioner or PA.  Electronically signed by:  Lynnae Sandhoff, MD Page: [phone] 07/20/2020, 1:15 PM

## 2020-07-20 NOTE — ED Notes (Signed)
Pt's roommate Rashaun, updated

## 2020-07-20 NOTE — ED Notes (Signed)
Notified Dr. Regenia Skeeter of BP now 87/53. 500 NS bolus running.

## 2020-07-20 NOTE — ED Notes (Signed)
Copy of critical lab results given to Dr Regenia Skeeter

## 2020-07-20 NOTE — ED Provider Notes (Signed)
[facility] EMERGENCY DEPARTMENT Provider Note   CSN: [id number] Arrival date & time: 07/20/20  1231  LEVEL 5 CAVEAT - UNRESPONSIVE   History Chief Complaint  Patient presents with  . Seizures    [name] [name] is a 40 y.o. male.  HPI  40 year old male presents with seizures.  History is from EMS as the patient is unresponsive.  Patient was at dialysis and had a fever/felt hot and so they were about to check in for COVID but then he had seizures.  He had at least 1 with EMS and at least one at the dialysis center.  Patient was combative and postictal after awakening and was given 5 mg Versed IM and then another 2.  Now he is being bagged.  EMS reports a history of noncompliance but is unclear what meds he has or has not been taking.  Past Medical History:  Diagnosis Date  . Chronic kidney disease   . ESRD (end stage renal disease) ([place])   . Hypertension   . Lupus ([place])   . Seizures ([place])   . Seizures ([place])   . Stroke [facility])    59 or 40 years of age.    . Wears glasses     Patient Active Problem List   Diagnosis Date Noted  . Seizure ([place]) 07/20/2020  . Seizure, late effect of stroke ([place]) 08/26/2019  . Acute renal failure superimposed on chronic kidney disease ([place]) 09/20/2018  . ESRD (end stage renal disease) ([place]) 03/18/2018  . Prediabetes 01/10/2018  . ARF (acute renal failure) ([place]) 12/21/2017  . Chest pain in adult   . CKD (chronic kidney disease) stage 3, GFR 30-59 ml/min ([facility]) 10/24/2015  . Seizures ([place]) 10/24/2015  . HTN (hypertension) 01/12/2015  . Chronic anticoagulation 04/24/2011  . Lupus anticoagulant positive 04/24/2011  . OBESITY, NOS 08/15/2006  . THROMBOCYTOPENIA 08/15/2006  . TOBACCO DEPENDENCE 08/15/2006  . CVA 08/15/2006    Past Surgical History:  Procedure Laterality Date  . AV FISTULA PLACEMENT Left 12/30/2017   Procedure: ARTERIOVENOUS (AV) FISTULA CREATION VERSUS INSERTION OF ARTERIOVENOUS GRAFT LEFT ARM;  Surgeon: Rosetta Posner, MD;  Location: [place];  Service: Vascular;  Laterality: Left;  . BASCILIC VEIN TRANSPOSITION Left 08/15/2018   Procedure: LEFT FIRST STAGE BASCILIC VEIN TRANSPOSITION;  Surgeon: Rosetta Posner, MD;  Location: [place];  Service: Vascular;  Laterality: Left;  . BASCILIC VEIN TRANSPOSITION Left 10/30/2018   Procedure: INSERTION OF GORE-TEX GRAFT LEFT UPPER ARM;  Surgeon: Waynetta Sandy, MD;  Location: [place];  Service: Vascular;  Laterality: Left;  . INSERTION OF DIALYSIS CATHETER N/A 09/20/2018   Procedure: INSERTION OF TUNNELED DIALYSIS CATHETER RIGHT INTERNAL JUGULAR;  Surgeon: Marty Heck, MD;  Location: [facility];  Service: Vascular;  Laterality: N/A;  . RENAL BIOPSY         Family History  Problem Relation Age of Onset  . Diabetes Mother   . Diabetes Maternal Grandmother   . Seizures Neg Hx        not on mother's side. he doesn't know about his father's side    Social History   Tobacco Use  . Smoking status: Former Smoker    Packs/day: 0.25    Types: Cigarettes    Quit date: 12/21/2017    Years since quitting: 2.5  . Smokeless tobacco: Never Used  Vaping Use  . Vaping Use: Never used  Substance Use Topics  . Alcohol use: Not Currently  . Drug use: No  Home Medications Prior to Admission medications   Medication Sig Start Date End Date Taking? Authorizing Provider  amLODipine (NORVASC) 10 MG tablet Take 1 tablet (10 mg total) by mouth daily. 04/29/20   Alfredia Client, PA-C  apixaban (ELIQUIS) 2.5 MG TABS tablet Take 1 tablet (2.5 mg total) by mouth 2 (two) times daily. TAKE 1 TABLET (2.5 MG TOTAL) BY MOUTH 2 (TWO) TIMES DAILY. 07/07/19 10/05/19  Gildardo Pounds, NP  atorvastatin (LIPITOR) 40 MG tablet Take 1 tablet (40 mg total) by mouth daily at 6 PM. 07/07/19 10/05/19  Gildardo Pounds, NP  calcitRIOL (ROCALTROL) 0.25 MCG capsule Take 1 capsule (0.25 mcg total) by mouth daily. 11/24/18   Fulp, Cammie, MD  calcium acetate (PHOSLO) 667 MG capsule Take 1 capsule  (667 mg total) by mouth See admin instructions. Take 667 mg with each meal and each snack Patient taking differently: Take 667-2,001 mg by mouth daily.  09/23/18   Cristal Ford, DO  carvedilol (COREG) 3.125 MG tablet Take 0.5 tablets (1.562 mg total) by mouth 2 (two) times daily with a meal. 04/29/20 07/28/20  Alfredia Client, PA-C  lamoTRIgine (LAMICTAL) 25 MG tablet Take 1 tablet (25 mg total) by mouth 2 (two) times daily. 04/29/20   Alfredia Client, PA-C  LamoTRIgine 300 MG TB24 24 hour tablet Take 1 tablet (300 mg total) by mouth daily. 08/26/19 08/20/20  Melvenia Beam, MD  oxyCODONE-acetaminophen (PERCOCET) 5-325 MG tablet Take 1 tablet by mouth every 6 (six) hours as needed for severe pain. 10/30/18   Rhyne, Hulen Shouts, PA-C  silver sulfADIAZINE (SILVADENE) 1 % cream Apply to affected skin areas once per day for 10 days or until area is healed 11/24/18   Fulp, Cammie, MD  hydrochlorothiazide (HYDRODIURIL) 25 MG tablet TAKE 1 TABLET (25 MG TOTAL) BY MOUTH DAILY. 04/29/20 04/29/20  Alfredia Client, PA-C    Allergies    Zyrtec [cetirizine] and Zyrtec [cetirizine]  Review of Systems   Review of Systems  Unable to perform ROS: Mental status change    Physical Exam Updated Vital Signs BP 131/82   Pulse 99   Temp (!) 105 F (40.6 C) (Rectal)   Resp 18   Ht '6\' 2"'$  (1.88 m)   Wt 113 kg   SpO2 100%   BMI 31.99 kg/m   Physical Exam Vitals and nursing note reviewed.  Constitutional:      Appearance: He is well-developed and well-nourished.  HENT:     Head: Normocephalic.     Right Ear: External ear normal.     Left Ear: External ear normal.     Nose: Nose normal.     Mouth/Throat:     Comments: Mild amount of blood in the oropharynx, no acute bleeding. Eyes:     General:        Right eye: No discharge.        Left eye: No discharge.     Pupils: Pupils are equal, round, and reactive to light.  Cardiovascular:     Rate and Rhythm: Regular rhythm. Tachycardia present.     Heart sounds:  Normal heart sounds.  Pulmonary:     Effort: Pulmonary effort is normal.     Breath sounds: Normal breath sounds. No wheezing or rales.  Abdominal:     Palpations: Abdomen is soft.  Musculoskeletal:        General: No edema.     Cervical back: Neck supple.  Skin:    General: Skin is warm and dry.  Neurological:     Mental Status: He is unresponsive.     Comments: Patient minimally responds to pain.  1 time he pulled his arm away with IV access but other times he does not respond to pain at all.  Psychiatric:        Mood and Affect: Mood is not anxious.     ED Results / Procedures / Treatments   Labs (all labs ordered are listed, but only abnormal results are displayed) Labs Reviewed  CBC WITH DIFFERENTIAL/PLATELET - Abnormal; Notable for the following components:      Result Value   RBC 3.31 (*)    Hemoglobin 10.8 (*)    HCT 33.1 (*)    Platelets 76 (*)    Lymphs Abs 0.3 (*)    All other components within normal limits  COMPREHENSIVE METABOLIC PANEL - Abnormal; Notable for the following components:   Chloride 94 (*)    Glucose, Bld 106 (*)    BUN 41 (*)    Creatinine, Ser 12.63 (*)    Calcium 8.0 (*)    GFR, Estimated 5 (*)    Anion gap 21 (*)    All other components within normal limits  MAGNESIUM - Abnormal; Notable for the following components:   Magnesium 2.6 (*)    All other components within normal limits  POC SARS CORONAVIRUS 2 AG -  ED - Abnormal; Notable for the following components:   SARS Coronavirus 2 Ag POSITIVE (*)    All other components within normal limits  CBG MONITORING, ED - Abnormal; Notable for the following components:   Glucose-Capillary 102 (*)    All other components within normal limits  I-STAT CHEM 8, ED - Abnormal; Notable for the following components:   Chloride 96 (*)    BUN 44 (*)    Creatinine, Ser 13.30 (*)    Glucose, Bld 105 (*)    Calcium, Ion 0.89 (*)    Hemoglobin 11.6 (*)    HCT 34.0 (*)    All other components within  normal limits  I-STAT ARTERIAL BLOOD GAS, ED - Abnormal; Notable for the following components:   pCO2 arterial 52.5 (*)    pO2, Arterial 131 (*)    Bicarbonate 32.1 (*)    TCO2 33 (*)    Acid-Base Excess 7.0 (*)    Calcium, Ion 1.02 (*)    HCT 33.0 (*)    Hemoglobin 11.2 (*)    All other components within normal limits  CULTURE, BLOOD (ROUTINE X 2)  CULTURE, BLOOD (ROUTINE X 2)  PHOSPHORUS  PROTIME-INR  ETHANOL  BLOOD GAS, ARTERIAL  HIV ANTIBODY (ROUTINE TESTING W REFLEX)  HEMOGLOBIN A1C  CBG MONITORING, ED    EKG EKG Interpretation  Date/Time:  Wednesday July 20 2020 12:32:36 EST Ventricular Rate:  116 PR Interval:    QRS Duration: 105 QT Interval:  349 QTC Calculation: 485 R Axis:   99 Text Interpretation: Sinus tachycardia Left atrial enlargement Consider right ventricular hypertrophy ST elevation suggests acute pericarditis Borderline prolonged QT interval Confirmed by Sherwood Gambler [id number]) on 07/20/2020 12:53:46 PM   Radiology CT HEAD WO CONTRAST  Result Date: 07/20/2020 CLINICAL DATA:  Seizure. EXAM: CT HEAD WITHOUT CONTRAST TECHNIQUE: Contiguous axial images were obtained from the base of the skull through the vertex without intravenous contrast. COMPARISON:  04/29/2019 FINDINGS: Brain: There is no evidence for acute hemorrhage, hydrocephalus, mass lesion, or abnormal extra-axial fluid collection. No definite CT evidence for acute infarction. Diffuse loss of parenchymal volume  is consistent with atrophy. Similar appearance of left parietal encephalomalacia with encephalomalacia also noted in the right frontal parietal region and right parietooccipital region. Lacunar infarcts noted left basal ganglia. Vascular: No hyperdense vessel or unexpected calcification. Skull: Normal. Negative for fracture or focal lesion. Sinuses/Orbits: Mild chronic mucosal thickening left maxillary sinus. Remaining visualized paranasal sinuses and mastoid air cells are clear. Other: None.  IMPRESSION: 1. Stable.  No acute intracranial abnormality. 2. Atrophy with chronic small vessel disease and stable bilateral areas of supratentorial encephalomalacia suggesting old infarcts. Electronically Signed   By: Misty Stanley M.D.   On: 07/20/2020 14:50   DG Chest Portable 1 View  Result Date: 07/20/2020 CLINICAL DATA:  Intubation. EXAM: PORTABLE CHEST 1 VIEW COMPARISON:  09/20/2018. FINDINGS: Endotracheal tube and NG tube in good anatomic position. Cardiomegaly. Diffuse bilateral pulmonary infiltrates/edema noted on today's exam. No pleural effusion or pneumothorax. Left subclavian stent noted. IMPRESSION: 1. Lines and tubes in good anatomic position. 2. Cardiomegaly. Diffuse bilateral pulmonary infiltrates/edema noted on today's exam. Electronically Signed   By: Marcello Moores  Register   On: 07/20/2020 13:18    Procedures .Critical Care Performed by: Sherwood Gambler, MD Authorized by: Sherwood Gambler, MD   Critical care provider statement:    Critical care time (minutes):  60   Critical care time was exclusive of:  Separately billable procedures and treating other patients   Critical care was necessary to treat or prevent imminent or life-threatening deterioration of the following conditions:  Respiratory failure and CNS failure or compromise   Critical care was time spent personally by me on the following activities:  Discussions with consultants, evaluation of patient's response to treatment, examination of patient, ordering and performing treatments and interventions, ordering and review of laboratory studies, ordering and review of radiographic studies, pulse oximetry, re-evaluation of patient's condition, obtaining history from patient or surrogate, review of old charts and ventilator management Procedure Name: Intubation Date/Time: 07/20/2020 3:07 PM Performed by: Sherwood Gambler, MD Pre-anesthesia Checklist: Patient identified, Patient being monitored, Emergency Drugs available, Timeout  performed and Suction available Oxygen Delivery Method: Non-rebreather mask Preoxygenation: Pre-oxygenation with 100% oxygen Induction Type: Rapid sequence Ventilation: Mask ventilation without difficulty Laryngoscope Size: Glidescope and 3 Grade View: Grade I Tube size: 7.5 mm Number of attempts: 1 Airway Equipment and Method: Video-laryngoscopy Placement Confirmation: ETT inserted through vocal cords under direct vision,  CO2 detector and Breath sounds checked- equal and bilateral Secured at: 27 cm Dental Injury: Teeth and Oropharynx as per pre-operative assessment         Medications Ordered in ED Medications  propofol (DIPRIVAN) 1000 MG/100ML infusion (10 mcg/kg/min  113 kg Intravenous Rate/Dose Change 07/20/20 1311)  rocuronium bromide 100 MG/10ML SOSY (has no administration in time range)  succinylcholine (ANECTINE) 200 MG/10ML syringe (has no administration in time range)  etomidate (AMIDATE) 2 MG/ML injection (has no administration in time range)  lamoTRIgine (LAMICTAL) tablet 300 mg (has no administration in time range)  lamoTRIgine (LAMICTAL) tablet 25 mg (has no administration in time range)  docusate sodium (COLACE) capsule 100 mg (has no administration in time range)  polyethylene glycol (MIRALAX / GLYCOLAX) packet 17 g (has no administration in time range)  pantoprazole (PROTONIX) injection 40 mg (has no administration in time range)  insulin aspart (novoLOG) injection 0-6 Units (has no administration in time range)  fentaNYL (SUBLIMAZE) injection 50 mcg (has no administration in time range)  fentaNYL 2522mg in NS 2578m(1065mml) infusion-PREMIX (has no administration in time range)  fentaNYL (SUBLIMAZE) bolus via  infusion 50 mcg (has no administration in time range)  etomidate (AMIDATE) injection (30 mg Intravenous Given 07/20/20 1238)  rocuronium (ZEMURON) injection (100 mg Intravenous Given 07/20/20 1238)  acetaminophen (TYLENOL) suppository 975 mg (975 mg Rectal  Given 07/20/20 1323)  fentaNYL (SUBLIMAZE) injection 100 mcg (100 mcg Intravenous Given 07/20/20 1327)  midazolam (VERSED) injection 4 mg (4 mg Intravenous Given 07/20/20 1330)  sodium chloride 0.9 % bolus 500 mL (500 mLs Intravenous New Bag/Given 07/20/20 1354)    ED Course  I have reviewed the triage vital signs and the nursing notes.  Pertinent labs & imaging results that were available during my care of the patient were reviewed by me and considered in my medical decision making (see chart for details).    MDM Rules/Calculators/A&P                          Patient presents altered and does not appear to be protecting his airway.  Part of this is from being postictal and part of this from Versed from EMS which was appropriately given due to agitation.  At this point, he was intubated and had a precipitous drop in his oxygen despite quick intubation.  Covid swab is positive.  I think this is explaining his fever and he has a known history of seizures and so I think CNS infection is less likely.  CT head has been reviewed and is benign.  Chest x-ray looks like COVID.  Initial labs are overall unremarkable.  I discussed with Dr. Theda Sers of neurology and he recommends giving him his Lamictal dose per OG.  Pharmacy has helped with this.  Otherwise he was given propofol for sedation which also should help with seizures.  Admit to ICU.  [name] [name] was evaluated in Emergency Department on 07/20/2020 for the symptoms described in the history of present illness. He was evaluated in the context of the global COVID-19 pandemic, which necessitated consideration that the patient might be at risk for infection with the SARS-CoV-2 virus that causes COVID-19. Institutional protocols and algorithms that pertain to the evaluation of patients at risk for COVID-19 are in a state of rapid change based on information released by regulatory bodies including the CDC and federal and state organizations. These policies and  algorithms were followed during the patient's care in the ED.  Final Clinical Impression(s) / ED Diagnoses Final diagnoses:  Acute respiratory failure with hypoxia ([place])  Pneumonia due to COVID-19 virus  Seizure [facility])    Rx / DC Orders ED Discharge Orders    None       Sherwood Gambler, MD 07/20/20 1536

## 2020-07-20 NOTE — Progress Notes (Signed)
Transported patient from ED to 4N23 without event.

## 2020-07-20 NOTE — Progress Notes (Signed)
eLink Physician-Brief Progress Note Patient Name: [name] [name] DOB: August 11, 1980 MRN: [id number]   Date of Service  07/20/2020  HPI/Events of Note  Patient with a known history of ESRD-DD and seizure disorder transported to the hospital Ed after a witnessed seizure at his dialysis facility, he had a second seizure in the ED and was intubated for airway protection, he is Covid +.  He is now running a very high fever with recorded T.max of 105 degrees, although his white blood cell count is 8.7 and his lactate is 1.1.  eICU Interventions  New Patient Evaluation completed. Cooling blanket.         Kerry Kass Ogan 07/20/2020, 11:09 PM

## 2020-07-20 NOTE — ED Triage Notes (Signed)
Pt brought in from dialysis via [facility] after having a seizure at dialysis. Staff called EMS because of seizure and pt becoming combative. Versed administered which resolved seizure initially, but then pt had another tonic clonic seizure enroute and versed was administered again. Pt did have incontinence along with tongue trauma. Pt was having respirations assisted w/ BVM upon arrival. Pt unresponsive at this time.

## 2020-07-20 NOTE — ED Notes (Signed)
Notified Dr. Regenia Skeeter of pt's BP 268/ 176. Received orders for additional sedation.

## 2020-07-20 NOTE — H&P (Addendum)
NAME:  [name] [name], MRN:  [id number], DOB:  07/06/1980, LOS: 0 ADMISSION DATE:  07/20/2020, CONSULTATION DATE:  07/20/20 REFERRING MD:  EDP CHIEF COMPLAINT:  seizure  Brief History:  40 y.o. M with PMH of ESRD on dialysis, seizures, HTN prior CVA and Lupus who presented to the ED after having seizure at dialysis.  Pt.was post-ictal requiring bagging on arrival, so was intubated in the ED.  Febrile and covid+  History of Present Illness:  [name] [name] is a 40 y.o. M with PMH of  ESRD on dialysis, seizures, HTN prior CVA and Lupus who presented to the ED after having seizure at dialysis.  Pt has a history of breakthrough seizures after missing doses of Lamictal and minimal outpatient follow up.  He was notably febrile at dialysis and had tonic clonic seizure activity.  He was post-ictal and combative and received Versed by EMS, later required bagging and was intubated in the ED.    Work-up significant for positive Covid-19 test (vaccinated), no acute findings on head CT, fever to 105. No leukocytosis or hypotension.  He was seen by neurology and had a negative EEG in the ED and no nuchal rigidity on exam.  PCCM consulted for admission  Past Medical History:   has a past medical history of Chronic kidney disease, ESRD (end stage renal disease) ([place]), Hypertension, Lupus ([place]), Seizures ([place]), Seizures ([place]), Stroke ([place]), and Wears glasses.   Significant Hospital Events:  2/2 Admit to PCCM  Consults:  Neurology  Procedures:  2/2 ETT  Significant Diagnostic Tests:  2/2 CT head>>1. Stable.  No acute intracranial abnormality. 2. Atrophy with chronic small vessel disease and stable bilateral areas of supratentorial encephalomalacia suggesting old infarcts.  Micro Data:  2/2 BCx2>>  Antimicrobials:    Interim History / Subjective:  As above  Objective   Blood pressure 107/67, pulse 94, temperature (!) 101.5 F (38.6 C), resp. rate 20, height '6\' 2"'$  (1.88 m), weight 113 kg, SpO2 99 %.     Vent Mode: PRVC FiO2 (%):  [100 %] 100 % Set Rate:  [18 bmp] 18 bmp Vt Set:  [490 mL-640 mL] 490 mL PEEP:  [10 cmH20] 10 cmH20  No intake or output data in the 24 hours ending 07/20/20 1608 Filed Weights   07/20/20 1250  Weight: 113 kg    General: Intubated and sedated male HEENT: MM pink/moist, ETT in place, bloody secretions, no nuchal rigidity  Neuro: Examined while sedated on propofol, pupils equal and reactive, triggering breaths on vent, reaching with both arms for the ET tube CV: s1s2 RRR, no m/r/g PULM: Mechanical breath sounds bilaterally, on full vent support GI: soft, bsx4 active  Extremities: warm/dry, 2+ pretibial edema  Skin: no rashes or lesions   Resolved Hospital Problem list     Assessment & Plan:   Seizure with post-ictal respiratory depression Known seizure disorder with history of breakthrough seizures, threshold may be lowered by high fever.  Intubated in the emergency department for airway protection P: -EEG without evidence of status epilepticus,  loaded with 1 g of Keppra, on propofol and continue Lamictal, may need dosage adjustment or alternate medication as is dialyzable -Appreciate neurology recommendations, do not feel that LP is indicated at this time -Serial neuro checks -Seizure precautions -Follow blood cultures    Covid-19 pneumonia Patient is vaccinated, diffuse infiltrates on CXR P: -Infection precautions -Start steroids, likely low benefit to remdesivir in inpatient population -Check inflammatory markers -Low tidal volume ventilation 6 cc/kg -Maintain  full vent support with SAT/SBT as tolerated -titrate Vent setting to maintain SpO2 greater than or equal to 90%. -HOB elevated 30 degrees. -Plateau pressures less than 30 cm H20, goal driving [id number] -Follow chest x-ray, ABG prn.   -Bronchial hygiene and RT/bronchodilator protocol.    Hypertension Hold Norvasc for now, resume as needed    History of lupus,  CVA -Continue Eliquis   Thrombocytopenia -Chronic, monitor daily CBC  ESRD Dialysis M/W/F -unclear how much received the day of admission -Non-urgent nephrology consult  Best practice (evaluated daily)  Diet: N.p.o. Pain/Anxiety/Delirium protocol (if indicated): Protonix, fentanyl VAP protocol (if indicated): HOB 30 degrees, suction as needed DVT prophylaxis: Eliquis GI prophylaxis: Protonix Glucose control: SSI Mobility: Bedrest Disposition: ICU Family communication: Patient has a friend [place] listed in chart, unable to reach him   Goals of Care:  Last date of multidisciplinary goals of care discussion: Family and staff present:  Summary of discussion:  Follow up goals of care discussion due: 2/9 Code Status: Full code  Labs   CBC: Recent Labs  Lab 07/20/20 1307 07/20/20 1327 07/20/20 1352  WBC  --  8.7  --   NEUTROABS  --  7.7  --   HGB 11.6* 10.8* 11.2*  HCT 34.0* 33.1* 33.0*  MCV  --  100.0  --   PLT  --  76*  --     Basic Metabolic Panel: Recent Labs  Lab 07/20/20 1307 07/20/20 1327 07/20/20 1352  NA 136 137 136  K 3.8 3.8 3.9  CL 96* 94*  --   CO2  --  22  --   GLUCOSE 105* 106*  --   BUN 44* 41*  --   CREATININE 13.30* 12.63*  --   CALCIUM  --  8.0*  --   MG  --  2.6*  --   PHOS  --  4.6  --    GFR: Estimated Creatinine Clearance: 10.3 mL/min (A) (by C-G formula based on SCr of 12.63 mg/dL (H)). Recent Labs  Lab 07/20/20 1327  WBC 8.7    Liver Function Tests: Recent Labs  Lab 07/20/20 1327  AST 35  ALT 22  ALKPHOS 74  BILITOT 1.0  PROT 7.8  ALBUMIN 3.7   No results for input(s): LIPASE, AMYLASE in the last 168 hours. No results for input(s): AMMONIA in the last 168 hours.  ABG    Component Value Date/Time   PHART 7.408 07/20/2020 1352   PCO2ART 52.5 (H) 07/20/2020 1352   PO2ART 131 (H) 07/20/2020 1352   HCO3 32.1 (H) 07/20/2020 1352   TCO2 33 (H) 07/20/2020 1352   ACIDBASEDEF 2.8 (H) 02/15/2009 1750   O2SAT 98.0  07/20/2020 1352     Coagulation Profile: Recent Labs  Lab 07/20/20 1327  INR 1.0    Cardiac Enzymes: No results for input(s): CKTOTAL, CKMB, CKMBINDEX, TROPONINI in the last 168 hours.  HbA1C: Hemoglobin A1C  Date/Time Value Ref Range Status  07/07/2019 02:19 PM 5.2 4.0 - 5.6 % Final  01/10/2018 03:30 PM 5.3 4.0 - 5.6 % Final   Hgb A1c MFr Bld  Date/Time Value Ref Range Status  01/18/2014 04:59 PM 5.8 (H) <5.7 % Final    Comment:  According to the ADA Clinical Practice Recommendations for 2011, when HbA1c is used as a screening test:     >=6.5%   Diagnostic of Diabetes Mellitus            (if abnormal result is confirmed)   5.7-6.4%   Increased risk of developing Diabetes Mellitus   References:Diagnosis and Classification of Diabetes Mellitus,Diabetes [id number] 1):S62-S69 and Standards of Medical Care in         Diabetes - 2011,Diabetes [id number] (Suppl 1):S11-S61.      CBG: Recent Labs  Lab 07/20/20 1233  GLUCAP 102*    Review of Systems:   Unable to obtain secondary to intubation  Past Medical History:  He,  has a past medical history of Chronic kidney disease, ESRD (end stage renal disease) ([place]), Hypertension, Lupus ([place]), Seizures ([place]), Seizures ([place]), Stroke ([place]), and Wears glasses.   Surgical History:   Past Surgical History:  Procedure Laterality Date  . AV FISTULA PLACEMENT Left 12/30/2017   Procedure: ARTERIOVENOUS (AV) FISTULA CREATION VERSUS INSERTION OF ARTERIOVENOUS GRAFT LEFT ARM;  Surgeon: Rosetta Posner, MD;  Location: [facility];  Service: Vascular;  Laterality: Left;  . BASCILIC VEIN TRANSPOSITION Left 08/15/2018   Procedure: LEFT FIRST STAGE BASCILIC VEIN TRANSPOSITION;  Surgeon: Rosetta Posner, MD;  Location: [facility];  Service: Vascular;  Laterality: Left;  . BASCILIC VEIN TRANSPOSITION Left 10/30/2018   Procedure: INSERTION OF GORE-TEX GRAFT LEFT UPPER ARM;   Surgeon: Waynetta Sandy, MD;  Location: [facility];  Service: Vascular;  Laterality: Left;  . INSERTION OF DIALYSIS CATHETER N/A 09/20/2018   Procedure: INSERTION OF TUNNELED DIALYSIS CATHETER RIGHT INTERNAL JUGULAR;  Surgeon: Marty Heck, MD;  Location: [facility];  Service: Vascular;  Laterality: N/A;  . RENAL BIOPSY       Social History:   reports that he quit smoking about 2 years ago. His smoking use included cigarettes. He smoked 0.25 packs per day. He has never used smokeless tobacco. He reports previous alcohol use. He reports that he does not use drugs.   Family History:  His family history includes Diabetes in his maternal grandmother and mother. There is no history of Seizures.   Allergies Allergies  Allergen Reactions  . Zyrtec [Cetirizine] Swelling    FACIAL SWELLING, NOT CATEGORIZED  . Zyrtec [Cetirizine]      Home Medications  Prior to Admission medications   Medication Sig Start Date End Date Taking? Authorizing Provider  amLODipine (NORVASC) 10 MG tablet Take 1 tablet (10 mg total) by mouth daily. 04/29/20   Alfredia Client, PA-C  apixaban (ELIQUIS) 2.5 MG TABS tablet Take 1 tablet (2.5 mg total) by mouth 2 (two) times daily. TAKE 1 TABLET (2.5 MG TOTAL) BY MOUTH 2 (TWO) TIMES DAILY. 07/07/19 10/05/19  Gildardo Pounds, NP  atorvastatin (LIPITOR) 40 MG tablet Take 1 tablet (40 mg total) by mouth daily at 6 PM. 07/07/19 10/05/19  Gildardo Pounds, NP  calcitRIOL (ROCALTROL) 0.25 MCG capsule Take 1 capsule (0.25 mcg total) by mouth daily. 11/24/18   Fulp, Cammie, MD  calcium acetate (PHOSLO) 667 MG capsule Take 1 capsule (667 mg total) by mouth See admin instructions. Take 667 mg with each meal and each snack Patient taking differently: Take 667-2,001 mg by mouth daily.  09/23/18   Cristal Ford, DO  carvedilol (COREG) 3.125 MG tablet Take 0.5 tablets (1.562 mg total) by mouth 2 (two) times daily with a meal. 04/29/20 07/28/20  Alfredia Client, PA-C  lamoTRIgine (LAMICTAL)  25  MG tablet Take 1 tablet (25 mg total) by mouth 2 (two) times daily. 04/29/20   Alfredia Client, PA-C  LamoTRIgine 300 MG TB24 24 hour tablet Take 1 tablet (300 mg total) by mouth daily. 08/26/19 08/20/20  Melvenia Beam, MD  oxyCODONE-acetaminophen (PERCOCET) 5-325 MG tablet Take 1 tablet by mouth every 6 (six) hours as needed for severe pain. 10/30/18   Rhyne, Hulen Shouts, PA-C  silver sulfADIAZINE (SILVADENE) 1 % cream Apply to affected skin areas once per day for 10 days or until area is healed 11/24/18   Fulp, Cammie, MD  hydrochlorothiazide (HYDRODIURIL) 25 MG tablet TAKE 1 TABLET (25 MG TOTAL) BY MOUTH DAILY. 04/29/20 04/29/20  Alfredia Client, PA-C     Critical care time: 40 minutes        CRITICAL CARE Performed by: Otilio Carpen Khadeja Abt   Total critical care time: 40 minutes  Critical care time was exclusive of separately billable procedures and treating other patients.  Critical care was necessary to treat or prevent imminent or life-threatening deterioration.  Critical care was time spent personally by me on the following activities: development of treatment plan with patient and/or surrogate as well as nursing, discussions with consultants, evaluation of patient's response to treatment, examination of patient, obtaining history from patient or surrogate, ordering and performing treatments and interventions, ordering and review of laboratory studies, ordering and review of radiographic studies, pulse oximetry and re-evaluation of patient's condition.   Otilio Carpen Xzander Gilham, PA-C [facility] See Amion for pager If no response to pager , please call [phone] until 7pm After 7:00 pm call Elink  [phone]?[place]

## 2020-07-20 NOTE — Progress Notes (Signed)
Stat EEG completed, results pending  

## 2020-07-21 ENCOUNTER — Institutional Professional Consult (permissible substitution): Payer: BC Managed Care – PPO | Admitting: Neurology

## 2020-07-21 ENCOUNTER — Encounter (HOSPITAL_COMMUNITY): Payer: Self-pay | Admitting: Pulmonary Disease

## 2020-07-21 DIAGNOSIS — N186 End stage renal disease: Secondary | ICD-10-CM

## 2020-07-21 DIAGNOSIS — G40209 Localization-related (focal) (partial) symptomatic epilepsy and epileptic syndromes with complex partial seizures, not intractable, without status epilepticus: Secondary | ICD-10-CM

## 2020-07-21 DIAGNOSIS — R569 Unspecified convulsions: Secondary | ICD-10-CM

## 2020-07-21 DIAGNOSIS — J9601 Acute respiratory failure with hypoxia: Secondary | ICD-10-CM | POA: Diagnosis not present

## 2020-07-21 DIAGNOSIS — G40919 Epilepsy, unspecified, intractable, without status epilepticus: Secondary | ICD-10-CM

## 2020-07-21 LAB — POCT I-STAT 7, (LYTES, BLD GAS, ICA,H+H)
Acid-Base Excess: 4 mmol/L — ABNORMAL HIGH (ref 0.0–2.0)
Bicarbonate: 29.6 mmol/L — ABNORMAL HIGH (ref 20.0–28.0)
Calcium, Ion: 0.99 mmol/L — ABNORMAL LOW (ref 1.15–1.40)
HCT: 31 % — ABNORMAL LOW (ref 39.0–52.0)
Hemoglobin: 10.5 g/dL — ABNORMAL LOW (ref 13.0–17.0)
O2 Saturation: 99 %
Patient temperature: 98
Potassium: 5.2 mmol/L — ABNORMAL HIGH (ref 3.5–5.1)
Sodium: 134 mmol/L — ABNORMAL LOW (ref 135–145)
TCO2: 31 mmol/L (ref 22–32)
pCO2 arterial: 48.8 mmHg — ABNORMAL HIGH (ref 32.0–48.0)
pH, Arterial: 7.39 (ref 7.350–7.450)
pO2, Arterial: 129 mmHg — ABNORMAL HIGH (ref 83.0–108.0)

## 2020-07-21 LAB — BASIC METABOLIC PANEL
Anion gap: 17 — ABNORMAL HIGH (ref 5–15)
BUN: 59 mg/dL — ABNORMAL HIGH (ref 6–20)
CO2: 21 mmol/L — ABNORMAL LOW (ref 22–32)
Calcium: 6.9 mg/dL — ABNORMAL LOW (ref 8.9–10.3)
Chloride: 93 mmol/L — ABNORMAL LOW (ref 98–111)
Creatinine, Ser: 14.71 mg/dL — ABNORMAL HIGH (ref 0.61–1.24)
GFR, Estimated: 4 mL/min — ABNORMAL LOW (ref >60)
Glucose, Bld: 109 mg/dL — ABNORMAL HIGH (ref 70–99)
Potassium: 5.1 mmol/L (ref 3.5–5.1)
Sodium: 131 mmol/L — ABNORMAL LOW (ref 135–145)

## 2020-07-21 LAB — CBC
HCT: 27.8 % — ABNORMAL LOW (ref 39.0–52.0)
Hemoglobin: 9.4 g/dL — ABNORMAL LOW (ref 13.0–17.0)
MCH: 34.8 pg — ABNORMAL HIGH (ref 26.0–34.0)
MCHC: 33.8 g/dL (ref 30.0–36.0)
MCV: 103 fL — ABNORMAL HIGH (ref 80.0–100.0)
Platelets: 76 10*3/uL — ABNORMAL LOW (ref 150–400)
RBC: 2.7 MIL/uL — ABNORMAL LOW (ref 4.22–5.81)
RDW: 14.9 % (ref 11.5–15.5)
WBC: 6.4 10*3/uL (ref 4.0–10.5)
nRBC: 0.3 % — ABNORMAL HIGH (ref 0.0–0.2)

## 2020-07-21 LAB — PROCALCITONIN: Procalcitonin: 71.93 ng/mL

## 2020-07-21 LAB — GLUCOSE, CAPILLARY
Glucose-Capillary: 123 mg/dL — ABNORMAL HIGH (ref 70–99)
Glucose-Capillary: 114 mg/dL — ABNORMAL HIGH (ref 70–99)

## 2020-07-21 LAB — MAGNESIUM: Magnesium: 3.1 mg/dL — ABNORMAL HIGH (ref 1.7–2.4)

## 2020-07-21 LAB — MRSA PCR SCREENING: MRSA by PCR: NEGATIVE

## 2020-07-21 MED ORDER — ORAL CARE MOUTH RINSE
15.0000 mL | Freq: Two times a day (BID) | OROMUCOSAL | Status: DC
  Administered 2020-07-21 – 2020-07-25 (×7): 15 mL via OROMUCOSAL

## 2020-07-21 MED ORDER — PREDNISONE 5 MG PO TABS: 50.0000 mg | ORAL_TABLET | Freq: Every day | ORAL | Status: DC

## 2020-07-21 MED ORDER — ORAL CARE MOUTH RINSE
15.0000 mL | OROMUCOSAL | Status: DC
  Administered 2020-07-21: 15 mL via OROMUCOSAL

## 2020-07-21 MED ORDER — LAMOTRIGINE 25 MG PO TABS: 25.0000 mg | ORAL_TABLET | Freq: Every day | ORAL | Status: DC

## 2020-07-21 MED ORDER — LAMOTRIGINE 25 MG PO TABS: 325.0000 mg | ORAL_TABLET | Freq: Every day | ORAL | Status: DC

## 2020-07-21 MED ORDER — LAMOTRIGINE 25 MG PO TABS
25.0000 mg | ORAL_TABLET | Freq: Every day | ORAL | Status: DC
  Administered 2020-07-21: 25 mg via ORAL
  Filled 2020-07-21: qty 1

## 2020-07-21 MED ORDER — CHLORHEXIDINE GLUCONATE 0.12% ORAL RINSE (MEDLINE KIT)
15.0000 mL | Freq: Two times a day (BID) | OROMUCOSAL | Status: DC
  Administered 2020-07-21 – 2020-07-23 (×6): 15 mL via OROMUCOSAL

## 2020-07-21 MED ORDER — CHLORHEXIDINE GLUCONATE CLOTH 2 % EX PADS: 6.0000 | MEDICATED_PAD | Freq: Every day | CUTANEOUS | Status: DC

## 2020-07-21 MED ORDER — METHYLPREDNISOLONE SODIUM SUCC 125 MG IJ SOLR
0.5000 mg/kg | Freq: Two times a day (BID) | INTRAMUSCULAR | Status: DC
  Administered 2020-07-21 – 2020-07-22 (×2): 56.25 mg via INTRAVENOUS
  Filled 2020-07-21 (×2): qty 2

## 2020-07-21 MED ORDER — LAMOTRIGINE 25 MG PO TABS
325.0000 mg | ORAL_TABLET | Freq: Every day | ORAL | Status: DC
  Filled 2020-07-21: qty 1

## 2020-07-21 MED ORDER — APIXABAN 2.5 MG PO TABS
2.5000 mg | ORAL_TABLET | Freq: Two times a day (BID) | ORAL | Status: DC
  Administered 2020-07-21 – 2020-07-25 (×9): 2.5 mg via ORAL
  Filled 2020-07-21 (×9): qty 1

## 2020-07-21 NOTE — H&P (Signed)
NAME:  [name] [name], MRN:  [id number], DOB:  [birth year], LOS: 1 ADMISSION DATE:  07/20/2020, CONSULTATION DATE:  07/21/20 REFERRING MD:  EDP CHIEF COMPLAINT:  seizure  Brief History:  40 y.o. M with PMH of ESRD on dialysis, seizures, HTN prior CVA and Lupus who presented to the ED after having seizure at dialysis.  Pt.was post-ictal requiring bagging on arrival, so was intubated in the ED.  Febrile and covid+  Past Medical History:   has a past medical history of Chronic kidney disease, ESRD (end stage renal disease) ([place]), Hypertension, Lupus ([place]), Seizures ([place]), Seizures ([place]), Stroke ([place]), and Wears glasses.   Significant Hospital Events:  2/2 Admit to PCCM  Consults:  Neurology Nephrology  Procedures:  2/2 ETT  Significant Diagnostic Tests:  2/2 CT head>>1. Stable.  No acute intracranial abnormality. 2. Atrophy with chronic small vessel disease and stable bilateral areas of supratentorial encephalomalacia suggesting old infarcts.  Micro Data:  2/2 BCx2>> no growth 2/2 MRSA PCR negative  Antimicrobials:    Interim History / Subjective:  Patient is awake, following commands, placed on pressure support trial, tolerating well so far Became afebrile  Objective   Blood pressure 121/79, pulse 66, temperature (!) 97.34 F (36.3 C), resp. rate 13, height '6\' 2"'$  (1.88 m), weight 113 kg, SpO2 97 %.    Vent Mode: PRVC FiO2 (%):  [40 %-100 %] 40 % Set Rate:  [18 bmp] 18 bmp Vt Set:  [490 mL-640 mL] 490 mL PEEP:  [6 cmH20-10 cmH20] 6 cmH20 Plateau Pressure:  [14 cmH20-20 cmH20] 17 cmH20   Intake/Output Summary (Last 24 hours) at 07/21/2020 1015 Last data filed at 07/21/2020 1000 Gross per 24 hour  Intake 1103.95 ml  Output 225 ml  Net 878.95 ml   Filed Weights   07/20/20 1250  Weight: 113 kg    General: Middle-aged African-American male, orally intubated, lying on the bed HEENT: MM pink/moist, ETT in place Neuro: Alert, awake, following commands, moving all 4  extremities. CV: s1s2 RRR, no m/r/g PULM: Mechanical breath sounds bilaterally, on full vent support GI: soft, bsx4 active  Extremities: warm/dry, 2+ pretibial edema  Skin: no rashes or lesions   Resolved Hospital Problem list     Assessment & Plan:   Breakthrough seizure, likely caused by febrile illness Patient with known seizure disorder with history of breakthrough seizures, threshold may be lowered by high fever EEG without evidence of status epilepticus,  loaded with 1 g of Keppra, was started on on propofol, which was discontinued this morning Continue Lamictal,  Neurology is following Seizure precautions  Covid-19 pneumonia Patient is vaccinated, diffuse infiltrates on CXR, likely due to volume overload from not completing HD session Continue airborne and contact isolation Continue methylprednisone followed by prednisone Not a started on remdesivir due to low benefit  Acute hypoxic respiratory failure, multifactorial including postictal from seizure, COVID-19 pneumonia and volume overloaded from not completing HD session due to seizure  Continue lung protective ventilation Patient is tolerating a spontaneous breathing trial, watch for respiratory distress, if remains stable will extubate him today HOB elevated 30 degrees. Bronchial hygiene and RT/bronchodilator protocol.  End-stage renal disease on hemodialysis, MWF Nephrology is consulted  Hypertension Hold Norvasc for now, resume as needed  History of lupus and prior CVA Continue Eliquis  Hyponatremia/hyperkalemia Due to end-stage renal disease Continue to monitor  Best practice (evaluated daily)  Diet: N.p.o. Pain/Anxiety/Delirium protocol (if indicated): N/A VAP protocol (if indicated): HOB 30 degrees, suction as needed DVT  prophylaxis: Eliquis GI prophylaxis: Protonix Glucose control: SSI Mobility: Bedrest Disposition: ICU  Goals of Care:  Last date of multidisciplinary goals of care  discussion: Family and staff present:  Summary of discussion:  Follow up goals of care discussion due: 2/9 Code Status: Full code  Labs   CBC: Recent Labs  Lab 07/20/20 1307 07/20/20 1327 07/20/20 1352 07/21/20 0427 07/21/20 0439  WBC  --  8.7  --   --  6.4  NEUTROABS  --  7.7  --   --   --   HGB 11.6* 10.8* 11.2* 10.5* 9.4*  HCT 34.0* 33.1* 33.0* 31.0* 27.8*  MCV  --  100.0  --   --  103.0*  PLT  --  76*  --   --  76*    Basic Metabolic Panel: Recent Labs  Lab 07/20/20 1307 07/20/20 1327 07/20/20 1352 07/21/20 0427 07/21/20 0439  NA 136 137 136 134* 131*  K 3.8 3.8 3.9 5.2* 5.1  CL 96* 94*  --   --  93*  CO2  --  22  --   --  21*  GLUCOSE 105* 106*  --   --  109*  BUN 44* 41*  --   --  59*  CREATININE 13.30* 12.63*  --   --  14.71*  CALCIUM  --  8.0*  --   --  6.9*  MG  --  2.6*  --   --  3.1*  PHOS  --  4.6  --   --   --    GFR: Estimated Creatinine Clearance: 8.8 mL/min (A) (by C-G formula based on SCr of 14.71 mg/dL (H)). Recent Labs  Lab 07/20/20 1327 07/20/20 1656 07/21/20 0439  WBC 8.7  --  6.4  LATICACIDVEN  --  1.1  --     Liver Function Tests: Recent Labs  Lab 07/20/20 1327  AST 35  ALT 22  ALKPHOS 74  BILITOT 1.0  PROT 7.8  ALBUMIN 3.7   No results for input(s): LIPASE, AMYLASE in the last 168 hours. No results for input(s): AMMONIA in the last 168 hours.  ABG    Component Value Date/Time   PHART 7.390 07/21/2020 0427   PCO2ART 48.8 (H) 07/21/2020 0427   PO2ART 129 (H) 07/21/2020 0427   HCO3 29.6 (H) 07/21/2020 0427   TCO2 31 07/21/2020 0427   ACIDBASEDEF 2.8 (H) 02/15/2009 1750   O2SAT 99.0 07/21/2020 0427     Coagulation Profile: Recent Labs  Lab 07/20/20 1327  INR 1.0    Cardiac Enzymes: No results for input(s): CKTOTAL, CKMB, CKMBINDEX, TROPONINI in the last 168 hours.  HbA1C: Hemoglobin A1C  Date/Time Value Ref Range Status  07/07/2019 02:19 PM 5.2 4.0 - 5.6 % Final  01/10/2018 03:30 PM 5.3 4.0 - 5.6 % Final    Hgb A1c MFr Bld  Date/Time Value Ref Range Status  07/20/2020 03:11 PM 5.0 4.8 - 5.6 % Final    Comment:    (NOTE) Pre diabetes:          5.7%-6.4%  Diabetes:              >6.4%  Glycemic control for   <7.0% adults with diabetes   01/18/2014 04:59 PM 5.8 (H) <5.7 % Final    Comment:  According to the ADA Clinical Practice Recommendations for 2011, when HbA1c is used as a screening test:     >=6.5%   Diagnostic of Diabetes Mellitus            (if abnormal result is confirmed)   5.7-6.4%   Increased risk of developing Diabetes Mellitus   References:Diagnosis and Classification of Diabetes Mellitus,Diabetes [id number] 1):S62-S69 and Standards of Medical Care in         Diabetes - 2011,Diabetes [id number] (Suppl 1):S11-S61.      CBG: Recent Labs  Lab 07/20/20 1658 07/20/20 1941 07/20/20 2329 07/21/20 0350 07/21/20 0839  GLUCAP 84 95 114* 123* 114*    Past Medical History:  He,  has a past medical history of Chronic kidney disease, ESRD (end stage renal disease) ([place]), Hypertension, Lupus ([place]), Seizures ([place]), Seizures ([place]), Stroke ([place]), and Wears glasses.   Surgical History:   Past Surgical History:  Procedure Laterality Date  . AV FISTULA PLACEMENT Left 12/30/2017   Procedure: ARTERIOVENOUS (AV) FISTULA CREATION VERSUS INSERTION OF ARTERIOVENOUS GRAFT LEFT ARM;  Surgeon: Rosetta Posner, MD;  Location: [facility];  Service: Vascular;  Laterality: Left;  . BASCILIC VEIN TRANSPOSITION Left 08/15/2018   Procedure: LEFT FIRST STAGE BASCILIC VEIN TRANSPOSITION;  Surgeon: Rosetta Posner, MD;  Location: [facility];  Service: Vascular;  Laterality: Left;  . BASCILIC VEIN TRANSPOSITION Left 10/30/2018   Procedure: INSERTION OF GORE-TEX GRAFT LEFT UPPER ARM;  Surgeon: Waynetta Sandy, MD;  Location: [facility];  Service: Vascular;  Laterality: Left;  . INSERTION OF DIALYSIS CATHETER N/A 09/20/2018    Procedure: INSERTION OF TUNNELED DIALYSIS CATHETER RIGHT INTERNAL JUGULAR;  Surgeon: Marty Heck, MD;  Location: [facility];  Service: Vascular;  Laterality: N/A;  . RENAL BIOPSY       Social History:   reports that he quit smoking about 2 years ago. His smoking use included cigarettes. He smoked 0.25 packs per day. He has never used smokeless tobacco. He reports previous alcohol use. He reports that he does not use drugs.   Family History:  His family history includes Diabetes in his maternal grandmother and mother. There is no history of Seizures.   Allergies Allergies  Allergen Reactions  . Zyrtec [Cetirizine] Swelling    FACIAL SWELLING, NOT CATEGORIZED  . Zyrtec [Cetirizine]      Home Medications  Prior to Admission medications   Medication Sig Start Date End Date Taking? Authorizing Provider  amLODipine (NORVASC) 10 MG tablet Take 1 tablet (10 mg total) by mouth daily. 04/29/20   Alfredia Client, PA-C  apixaban (ELIQUIS) 2.5 MG TABS tablet Take 1 tablet (2.5 mg total) by mouth 2 (two) times daily. TAKE 1 TABLET (2.5 MG TOTAL) BY MOUTH 2 (TWO) TIMES DAILY. 07/07/19 10/05/19  Gildardo Pounds, NP  atorvastatin (LIPITOR) 40 MG tablet Take 1 tablet (40 mg total) by mouth daily at 6 PM. 07/07/19 10/05/19  Gildardo Pounds, NP  calcitRIOL (ROCALTROL) 0.25 MCG capsule Take 1 capsule (0.25 mcg total) by mouth daily. 11/24/18   Fulp, Cammie, MD  calcium acetate (PHOSLO) 667 MG capsule Take 1 capsule (667 mg total) by mouth See admin instructions. Take 667 mg with each meal and each snack Patient taking differently: Take 667-2,001 mg by mouth daily.  09/23/18   Cristal Ford, DO  carvedilol (COREG) 3.125 MG tablet Take 0.5 tablets (1.562 mg total) by mouth 2 (two) times daily with a meal. 04/29/20 07/28/20  Alfredia Client, PA-C  lamoTRIgine (LAMICTAL) 25 MG  tablet Take 1 tablet (25 mg total) by mouth 2 (two) times daily. 04/29/20   Alfredia Client, PA-C  LamoTRIgine 300 MG TB24 24 hour tablet Take  1 tablet (300 mg total) by mouth daily. 08/26/19 08/20/20  Melvenia Beam, MD  oxyCODONE-acetaminophen (PERCOCET) 5-325 MG tablet Take 1 tablet by mouth every 6 (six) hours as needed for severe pain. 10/30/18   Rhyne, Hulen Shouts, PA-C  silver sulfADIAZINE (SILVADENE) 1 % cream Apply to affected skin areas once per day for 10 days or until area is healed 11/24/18   Fulp, Cammie, MD  hydrochlorothiazide (HYDRODIURIL) 25 MG tablet TAKE 1 TABLET (25 MG TOTAL) BY MOUTH DAILY. 04/29/20 04/29/20  Alfredia Client, PA-C      Total critical care time: 42 minutes  Performed by: [place] care time was exclusive of separately billable procedures and treating other patients.   Critical care was necessary to treat or prevent imminent or life-threatening deterioration.   Critical care was time spent personally by me on the following activities: development of treatment plan with patient and/or surrogate as well as nursing, discussions with consultants, evaluation of patient's response to treatment, examination of patient, obtaining history from patient or surrogate, ordering and performing treatments and interventions, ordering and review of laboratory studies, ordering and review of radiographic studies, pulse oximetry and re-evaluation of patient's condition.   Jacky Kindle MD [facility] See Amion for pager If no response to pager, please call [phone] until 7pm After 7pm, Please call E-link [phone]

## 2020-07-21 NOTE — Procedures (Signed)
Extubation Procedure Note  Patient Details:   Name: [name] [name] DOB: 1981-03-23 MRN: [id number]   Airway Documentation:    Vent end date: 07/21/20 Vent end time: 1043   Evaluation  O2 sats: stable throughout Complications: No apparent complications Patient did tolerate procedure well. Bilateral Breath Sounds: Clear,Diminished   Pt extubated to 3L [place] per MD order. Cuff leak positive prior to extubation. No stridor noted. Pt able to voice his name.   [name] 07/21/2020, 10:44 AM

## 2020-07-21 NOTE — Progress Notes (Signed)
After call from [place], this nurse changed settings on ECG monitor which was showing bigeminy, but appeared to be counting ST wave as QRS. This nurse changed QRS settings and the issue resolved.

## 2020-07-21 NOTE — Progress Notes (Signed)
At patient's request, this nurse attempted to contact [name] [name] to inform of admission to hospital. Phone went directly to VM without a personalized message. This nurse did not leave a message.

## 2020-07-21 NOTE — Progress Notes (Signed)
Patient was transferred from 4N to [phone]. Belongings at bedside. Patient is on 3L O2 [place]. Skin intact. Sacral foam dressing in place. Patient instructed on fall risk precautions and how to use call bell. Cardiac monitoring d/c'd prior to transfer, only O2 will be monitored.    Upon arrival, patient needed to use restroom. Used x1 assist w/ walker to restroom. Report given to 5W night nurse [place].

## 2020-07-21 NOTE — Progress Notes (Signed)
Informed Dr. Tacy Learn that patient is in vent. Bigeminy. No new orders.

## 2020-07-21 NOTE — Consult Note (Signed)
[facility] Renal Consultation Note    Indication for Consultation:  Management of ESRD/hemodialysis, anemia, hypertension/volume, and secondary hyperparathyroidism.   HPI: [name] [name] is a 40 y.o. male with PMH including ESRD on dialysis, HTN, seizure disorder, and hx CVA, who presented to the ED from his outpatient dialysis unit after having a seizure towards end of dialysis. Dialysis RN reports patient was SOB with a mild fever at the start of HD. He had missed dialysis on 1/31 and appeared volume overloaded. They were able to remove 5L with HD and planned to COVID test him after treatment, but he then began having seizures and was combative so EMS was called. He has had 2 doses of a COVID vaccine.   Pt continued to have seizures with EMS and was sedated. Required intubation for airway protection. He was febrile with TMax 105F. COVID 19 positive. CXR showed cardiomegaly with diffuse infiltrates vs edema. Neurology was consulted for management of seizures. Pt more alert today and was able to be extubated. Labs today notable for K+ 5.1, BUN 59, Cr 14.71, WBC 6.4, Hgb 9.4, Plt 76.  At time of exam, pt reports feeling fine. Asks "what happened yesterday?" He denies any SOB but O2 sat in the 89-91 on 3L [place]. Denies CP, palpitations, dizziness. No abdominal pain, nausea or vomiting. Denies recent fevers/chills. Asks if he can get the COVID booster now, advised he will need to wait until he recovers.   Past Medical History:  Diagnosis Date  . Chronic kidney disease   . ESRD (end stage renal disease) ([place])   . Hypertension   . Lupus ([place])   . Seizures ([place])   . Seizures ([place])   . Stroke [facility])    44 or 40 years of age.    . Wears glasses    Past Surgical History:  Procedure Laterality Date  . AV FISTULA PLACEMENT Left 12/30/2017   Procedure: ARTERIOVENOUS (AV) FISTULA CREATION VERSUS INSERTION OF ARTERIOVENOUS GRAFT LEFT ARM;  Surgeon: Rosetta Posner, MD;  Location: [place];   Service: Vascular;  Laterality: Left;  . BASCILIC VEIN TRANSPOSITION Left 08/15/2018   Procedure: LEFT FIRST STAGE BASCILIC VEIN TRANSPOSITION;  Surgeon: Rosetta Posner, MD;  Location: [place];  Service: Vascular;  Laterality: Left;  . BASCILIC VEIN TRANSPOSITION Left 10/30/2018   Procedure: INSERTION OF GORE-TEX GRAFT LEFT UPPER ARM;  Surgeon: Waynetta Sandy, MD;  Location: [place];  Service: Vascular;  Laterality: Left;  . INSERTION OF DIALYSIS CATHETER N/A 09/20/2018   Procedure: INSERTION OF TUNNELED DIALYSIS CATHETER RIGHT INTERNAL JUGULAR;  Surgeon: Marty Heck, MD;  [facility];  Service: Vascular;  Laterality: N/A;  . RENAL BIOPSY     Family History  Problem Relation Age of Onset  . Diabetes Mother   . Diabetes Maternal Grandmother   . Seizures Neg Hx        not on mother's side. he doesn't know about his father's side   Social History:  reports that he quit smoking about 2 years ago. His smoking use included cigarettes. He smoked 0.25 packs per day. He has never used smokeless tobacco. He reports previous alcohol use. He reports that he does not use drugs.  ROS: As per HPI otherwise negative.  Physical Exam: Vitals:   07/21/20 0900 07/21/20 1000 07/21/20 1043 07/21/20 1100  BP: (!) 126/96 121/79    Pulse: 61 66 71 74  Resp: '18 13 13 13  ' Temp: (!) 96.62 F (35.9 C) (!)  97.34 F (36.3 C) 98.06 F (36.7 C) 98.42 F (36.9 C)  TempSrc:      SpO2: 100% 97% 98% 91%  Weight:      Height:         General: Well developed, well nourished, alert and in no acute distress. Head: Normocephalic, atraumatic, sclera non-icteric, mucus membranes are moist. Neck: JVD not elevated. Lungs: + rales bilateral lower lobes. Breathing is unlabored on 3L [place]. Heart: RRR with normal S1, S2. No murmurs, rubs, or gallops appreciated. Abdomen: Soft, non-tender, non-distended with normoactive bowel sounds. No rebound/guarding. No obvious abdominal masses. Musculoskeletal:  Strength  and tone appear normal for age. Lower extremities: 1+ edema bilateral lower extremities Neuro: Alert and oriented X 3. Moves all extremities spontaneously. Psych:  Responds to questions appropriately with a normal affect. Dialysis Access: LUE AVF + bruit  Allergies  Allergen Reactions  . Zyrtec [Cetirizine] Swelling    FACIAL SWELLING, NOT CATEGORIZED  . Zyrtec [Cetirizine]    Prior to Admission medications   Medication Sig Start Date End Date Taking? Authorizing Provider  amLODipine (NORVASC) 10 MG tablet Take 1 tablet (10 mg total) by mouth daily. 04/29/20   Alfredia Client, PA-C  apixaban (ELIQUIS) 2.5 MG TABS tablet Take 1 tablet (2.5 mg total) by mouth 2 (two) times daily. TAKE 1 TABLET (2.5 MG TOTAL) BY MOUTH 2 (TWO) TIMES DAILY. 07/07/19 10/05/19  Gildardo Pounds, NP  atorvastatin (LIPITOR) 40 MG tablet Take 1 tablet (40 mg total) by mouth daily at 6 PM. 07/07/19 10/05/19  Gildardo Pounds, NP  calcitRIOL (ROCALTROL) 0.25 MCG capsule Take 1 capsule (0.25 mcg total) by mouth daily. 11/24/18   Fulp, Cammie, MD  calcium acetate (PHOSLO) 667 MG capsule Take 1 capsule (667 mg total) by mouth See admin instructions. Take 667 mg with each meal and each snack Patient taking differently: Take 667-2,001 mg by mouth daily.  09/23/18   Cristal Ford, DO  carvedilol (COREG) 3.125 MG tablet Take 0.5 tablets (1.562 mg total) by mouth 2 (two) times daily with a meal. 04/29/20 07/28/20  Alfredia Client, PA-C  lamoTRIgine (LAMICTAL) 25 MG tablet Take 1 tablet (25 mg total) by mouth 2 (two) times daily. 04/29/20   Alfredia Client, PA-C  LamoTRIgine 300 MG TB24 24 hour tablet Take 1 tablet (300 mg total) by mouth daily. 08/26/19 08/20/20  Melvenia Beam, MD  oxyCODONE-acetaminophen (PERCOCET) 5-325 MG tablet Take 1 tablet by mouth every 6 (six) hours as needed for severe pain. 10/30/18   Rhyne, Hulen Shouts, PA-C  silver sulfADIAZINE (SILVADENE) 1 % cream Apply to affected skin areas once per day for 10 days or until  area is healed 11/24/18   Fulp, Cammie, MD  hydrochlorothiazide (HYDRODIURIL) 25 MG tablet TAKE 1 TABLET (25 MG TOTAL) BY MOUTH DAILY. 04/29/20 04/29/20  Alfredia Client, PA-C   Current Facility-Administered Medications  Medication Dose Route Frequency Provider Last Rate Last Admin  . apixaban (ELIQUIS) tablet 2.5 mg  2.5 mg Oral BID Jacky Kindle, MD      . chlorhexidine gluconate (MEDLINE KIT) (PERIDEX) 0.12 % solution 15 mL  15 mL Mouth Rinse BID Jacky Kindle, MD   15 mL at 07/21/20 0846  . Chlorhexidine Gluconate Cloth 2 % PADS 6 each  6 each Topical Daily Kipp Brood, MD   6 each at 07/20/20 2326  . docusate (COLACE) 50 MG/5ML liquid 100 mg  100 mg Per Tube BID PRN Kipp Brood, MD      . Derrill Memo ON 07/22/2020]  lamoTRIgine (LAMICTAL) tablet 325 mg  325 mg Oral Q breakfast Jacky Kindle, MD       And  . lamoTRIgine (LAMICTAL) tablet 25 mg  25 mg Oral QHS Jacky Kindle, MD      . MEDLINE mouth rinse  15 mL Mouth Rinse BID Jacky Kindle, MD      . Derrill Memo ON 07/23/2020] predniSONE (DELTASONE) tablet 50 mg  50 mg Oral Daily Jacky Kindle, MD       Followed by  . methylPREDNISolone sodium succinate (SOLU-MEDROL) 125 mg/2 mL injection 56.25 mg  0.5 mg/kg Intravenous Q12H Jacky Kindle, MD      . pantoprazole (PROTONIX) injection 40 mg  40 mg Intravenous QHS Gleason, Otilio Carpen, PA-C   40 mg at 07/20/20 2321  . polyethylene glycol (MIRALAX / GLYCOLAX) packet 17 g  17 g Per Tube Daily PRN Kipp Brood, MD       Labs: Basic Metabolic Panel: Recent Labs  Lab 07/20/20 1307 07/20/20 1327 07/20/20 1352 07/21/20 0427 07/21/20 0439  NA 136 137 136 134* 131*  K 3.8 3.8 3.9 5.2* 5.1  CL 96* 94*  --   --  93*  CO2  --  22  --   --  21*  GLUCOSE 105* 106*  --   --  109*  BUN 44* 41*  --   --  59*  CREATININE 13.30* 12.63*  --   --  14.71*  CALCIUM  --  8.0*  --   --  6.9*  PHOS  --  4.6  --   --   --    Liver Function Tests: Recent Labs  Lab 07/20/20 1327  AST 35  ALT 22  ALKPHOS 74   BILITOT 1.0  PROT 7.8  ALBUMIN 3.7   CBC: Recent Labs  Lab 07/20/20 1327 07/20/20 1352 07/21/20 0427 07/21/20 0439  WBC 8.7  --   --  6.4  NEUTROABS 7.7  --   --   --   HGB 10.8* 11.2* 10.5* 9.4*  HCT 33.1* 33.0* 31.0* 27.8*  MCV 100.0  --   --  103.0*  PLT 76*  --   --  76*   CBG: Recent Labs  Lab 07/20/20 1658 07/20/20 1941 07/20/20 2329 07/21/20 0350 07/21/20 0839  GLUCAP 84 95 114* 123* 114*   Iron Studies:  Recent Labs    07/20/20 1656  FERRITIN 814*   Studies/Results: EEG  Result Date: 07/20/2020 Lora Havens, MD     07/20/2020  6:35 PM Patient Name: THEODIS KINSEL MRN: [id number] Epilepsy Attending: Lora Havens Referring Physician/Provider: Dr Kipp Brood Date: 07/19/2020 Duration: 24.55 mins Patient history: 40yo  M wit seizure like episode. EEG to evaluate for seizure Level of alertness:  Comatose AEDs during EEG study: LTG, LEV, Propofol Technical aspects: This EEG study was done with scalp electrodes positioned according to the 10-20 International system of electrode placement. Electrical activity was acquired at a sampling rate of '500Hz'  and reviewed with a high frequency filter of '70Hz'  and a low frequency filter of '1Hz' . EEG data were recorded continuously and digitally stored. Description: EEG showed continuous generalized polymorphic mixed frequencies  With predominantly 5 to 6 Hz theta slowing admixed with intermittent generalized 2-'3Hz'  delta slowing at times with triphasic morphology.  Intermittent 15-'18hz'  generalized beta activity was also noted. Hyperventilation and photic stimulation were not performed.   ABNORMALITY -Continuous slow, generalized IMPRESSION: This study is suggestive of severe diffuse encephalopathy, nonspecific etiology. No seizures or epileptiform  discharges were seen throughout the recording. Dr Lynetta Mare was notified. Lora Havens   CT HEAD WO CONTRAST  Result Date: 07/20/2020 CLINICAL DATA:  Seizure. EXAM: CT HEAD WITHOUT  CONTRAST TECHNIQUE: Contiguous axial images were obtained from the base of the skull through the vertex without intravenous contrast. COMPARISON:  04/29/2019 FINDINGS: Brain: There is no evidence for acute hemorrhage, hydrocephalus, mass lesion, or abnormal extra-axial fluid collection. No definite CT evidence for acute infarction. Diffuse loss of parenchymal volume is consistent with atrophy. Similar appearance of left parietal encephalomalacia with encephalomalacia also noted in the right frontal parietal region and right parietooccipital region. Lacunar infarcts noted left basal ganglia. Vascular: No hyperdense vessel or unexpected calcification. Skull: Normal. Negative for fracture or focal lesion. Sinuses/Orbits: Mild chronic mucosal thickening left maxillary sinus. Remaining visualized paranasal sinuses and mastoid air cells are clear. Other: None. IMPRESSION: 1. Stable.  No acute intracranial abnormality. 2. Atrophy with chronic small vessel disease and stable bilateral areas of supratentorial encephalomalacia suggesting old infarcts. Electronically Signed   By: Misty Stanley M.D.   On: 07/20/2020 14:50   DG Chest Portable 1 View  Result Date: 07/20/2020 CLINICAL DATA:  Intubation. EXAM: PORTABLE CHEST 1 VIEW COMPARISON:  09/20/2018. FINDINGS: Endotracheal tube and NG tube in good anatomic position. Cardiomegaly. Diffuse bilateral pulmonary infiltrates/edema noted on today's exam. No pleural effusion or pneumothorax. Left subclavian stent noted. IMPRESSION: 1. Lines and tubes in good anatomic position. 2. Cardiomegaly. Diffuse bilateral pulmonary infiltrates/edema noted on today's exam. Electronically Signed   By: Marcello Moores  Register   On: 07/20/2020 13:18    Outpatient Dialysis Orders:  Center: [facility]  on MWF. 200NRe, 3 hours, BFR 450, DFR 500, EDW 111.5kg, 2K/2Ca, AVG 15g Heparin 3700 unit bolus  Mircera 118mg q 2 weeks- last dose 07/20/20 Venofer 50 mg weekly  Hectorol 3 mcg  qHD  Assessment/Plan: 1.  Breakthrough seizure: Suspected to be due to fever. Neurology on board. On lamictal.  2. COVID 19: Febrile, positive for covid on arrival. Started on steroids and isolation precautions. Mgt per primary.  3.  ESRD:  Dialyzes MWF. Did receive most of his dialysis yesterday but has been noncompliant lately, shortening and missing treatments. Will plan for next HD tomorrow per regular schedule. Dialysis treatment will be shortened due to high census/staffing shortage during COVID surge.  4.  Hypertension/volume: CXR with diffuse infiltrates vs. Pulmonary edema. He was noted to be volume overloaded and is 1.5kg over his EDW. Volume overload likely contributing to respiratory failure at least partially. Will attempt high UF goal with HD as tolerated. BP controlled, amlodipine on hold for now.  5.  Anemia: Hgb 9.4. ESA just given yesterday. Hold IV iron in setting of acute infection.  6.  Metabolic bone disease: Calcium 8.0 -> 6.9 today. Has been in the 8's outpatient. Continue hectorol, use high Ca bath and follow. Phos at goal.   SAnice Paganini PA-C 07/21/2020, 11:17 AM  [facility] Pager: [phone]

## 2020-07-21 NOTE — Progress Notes (Signed)
Transferred patient with all belongings to [phone].

## 2020-07-21 NOTE — Progress Notes (Signed)
Neurology Progress Note  S: Patient was extubated early today, is sitting up in bed NAD no new complaints.    O: Current vital signs: BP (!) 132/103 (BP Location: Right Arm)   Pulse 75   Temp 98.96 F (37.2 C)   Resp 16   Ht _0  (1.88 m)   Wt 113 kg   SpO2 90%   BMI 31.99 kg/m  Vital signs in last 24 hours: Temp:  [96.44 F (35.8 C)-105 F (40.6 C)] 98.96 F (37.2 C) (02/03 1200) Pulse Rate:  [49-160] 75 (02/03 1200) Resp:  [13-26] 16 (02/03 1200) BP: (88-264)/(56-174) 132/103 (02/03 1200) SpO2:  [90 %-100 %] 90 % (02/03 1200) FiO2 (%):  [40 %-100 %] 40 % (02/03 0845) Weight:  [505 kg] 113 kg (02/02 1250) GENERAL: Awake, alert in NAD HEENT: Normocephalic and atraumatic, moist mm, no LN++, no thyromegaly LUNGS: symmetric excursions bilaterally with no audible wheezes. CV: RR, equal pulses bilaterally. ABDOMEN: Soft, nontender, nondistended with normoactive BS Ext: warm, well perfused, intact peripheral pulses  NEURO:  Mental Status: AA&Ox3  Language: speech is fluent.  Intact naming, repetition, and comprehension. PERR. EOMI, visual fields full, no facial asymmetry, facial sensation intact, hearing intact. No evidence of tongue atrophy or fibrillations, tongue/uvula/soft palate midline elevates symmetrically  Normal sternocleidomastoid and trapezius muscle strength. Motor:  strength in all extremities. Tone: Tone and bulk is normal Sensation: Intact to light touch bilaterally Coordination: FTN intact bilaterally, no ataxia in BLE. Gait- deferred  Medications  Current Facility-Administered Medications:  .  apixaban (ELIQUIS) tablet 2.5 mg, 2.5 mg, Oral, BID, Chand, Sudham, MD .  chlorhexidine gluconate (MEDLINE KIT) (PERIDEX) 0.12 % solution 15 mL, 15 mL, Mouth Rinse, BID, Chand, Sudham, MD, 15 mL at 07/21/20 0846 .  Chlorhexidine Gluconate Cloth 2 % PADS 6 each, 6 each, Topical, Daily, Kipp Brood, MD, 6 each at 07/20/20 2326 .  docusate (COLACE) 50 MG/5ML  liquid 100 mg, 100 mg, Per Tube, BID PRN, Kipp Brood, MD .  Derrill Memo ON 07/22/2020] lamoTRIgine (LAMICTAL) tablet 325 mg, 325 mg, Oral, Q breakfast **AND** lamoTRIgine (LAMICTAL) tablet 25 mg, 25 mg, Oral, QHS, Chand, Sudham, MD .  MEDLINE mouth rinse, 15 mL, Mouth Rinse, BID, Chand, Sudham, MD .  methylPREDNISolone sodium succinate (SOLU-MEDROL) 125 mg/2 mL injection 56.25 mg, 0.5 mg/kg, Intravenous, Q12H **FOLLOWED BY** [START ON 07/23/2020] predniSONE (DELTASONE) tablet 50 mg, 50 mg, Oral, Daily, Chand, Sudham, MD .  pantoprazole (PROTONIX) injection 40 mg, 40 mg, Intravenous, QHS, Gleason, Otilio Carpen, PA-C, 40 mg at 07/20/20 2321 .  polyethylene glycol (MIRALAX / GLYCOLAX) packet 17 g, 17 g, Per Tube, Daily PRN, Kipp Brood, MD Labs     Component Value Date/Time   WBC 6.4 07/21/2020 0439   RBC 2.70 (L) 07/21/2020 0439   HGB 9.4 (L) 07/21/2020 0439   HGB 11.6 (L) 12/12/2017 1609   HGB 15.5 10/14/2013 1434   HCT 27.8 (L) 07/21/2020 0439   HCT 34.7 (L) 12/12/2017 1609   HCT 45.6 10/14/2013 1434   PLT 76 (L) 07/21/2020 0439   PLT 134 (L) 12/12/2017 1609   MCV 103.0 (H) 07/21/2020 0439   MCV 88 12/12/2017 1609   MCV 87.2 10/14/2013 1434   MCH 34.8 (H) 07/21/2020 0439   MCHC 33.8 07/21/2020 0439   RDW 14.9 07/21/2020 0439   RDW 13.2 12/12/2017 1609   RDW 13.0 10/14/2013 1434   LYMPHSABS 0.3 (L) 07/20/2020 1327   LYMPHSABS 1.4 12/12/2017 1609   LYMPHSABS 1.4 10/14/2013 1434  MONOABS 0.7 07/20/2020 1327   MONOABS 0.5 10/14/2013 1434   EOSABS 0.0 07/20/2020 1327   EOSABS 0.2 12/12/2017 1609   BASOSABS 0.0 07/20/2020 1327   BASOSABS 0.0 12/12/2017 1609   BASOSABS 0.0 10/14/2013 1434       Component Value Date/Time   NA 131 (L) 07/21/2020 0439   NA 143 12/12/2017 1609   K 5.1 07/21/2020 0439   CL 93 (L) 07/21/2020 0439   CO2 21 (L) 07/21/2020 0439   GLUCOSE 109 (H) 07/21/2020 0439   BUN 59 (H) 07/21/2020 0439   BUN 36 (H) 12/12/2017 1609   CREATININE 14.71 (H) 07/21/2020  0439   CREATININE 2.06 (H) 10/24/2015 1603   CALCIUM 6.9 (L) 07/21/2020 0439   CALCIUM 8.4 (L) 12/23/2017 0237   PROT 7.8 07/20/2020 1327   PROT 6.7 12/12/2017 1609   ALBUMIN 3.7 07/20/2020 1327   ALBUMIN 3.7 12/12/2017 1609   AST 35 07/20/2020 1327   ALT 22 07/20/2020 1327   ALKPHOS 74 07/20/2020 1327   BILITOT 1.0 07/20/2020 1327   BILITOT <0.2 12/12/2017 1609   GFRNONAA 4 (L) 07/21/2020 0439   GFRNONAA 40 (L) 10/24/2015 1603   GFRAA 4 (L) 01/14/2020 2020   GFRAA 47 (L) 10/24/2015 1603       Component Value Date/Time   CHOL 143 07/07/2019 1458   TRIG 140 07/07/2019 1458   HDL 48 07/07/2019 1458   CHOLHDL 3.0 07/07/2019 1458   LDLCALC 71 07/07/2019 1458     Imaging I have reviewed images in epic and the results pertinent to this consultation are: CT Head showed Stable. No acute intracranial abnormality. Atrophy with chronic small vessel disease and stable bilateral areas of supratentorial encephalomalacia suggesting old infarcts.  Assessment: [name] [name] is a 40 y.o. 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 Patient stated he is adherent with his medication and that he did have a seizure during dialysis. He added though not frequent, he has had breakthrough seizure during dialysis in the past and this is not new. He does not have outpatient neurology follow up. Chart review showed that he has been seen at [facility] neurology most recently 08/26/2019 and he said that he would like to follow up.  Plan: - His serum LMT trend showed low threshold therapeutic levels and will increase his LMT XR 335m qhs to 4056mXR qhs. - By hemodialysis extraction factor calculation would recommend he take LMT 5077mhewable when he starts HD. - A referral to [facility] was placed to see him in 1-2  weeks.  Please call neurology for questions.   Electronically signed by:  HunLynnae SandhoffD Page: [phone]/2022, 12:40 PM

## 2020-07-22 LAB — COMPREHENSIVE METABOLIC PANEL
ALT: 19 U/L (ref 0–44)
AST: 25 U/L (ref 15–41)
Albumin: 3 g/dL — ABNORMAL LOW (ref 3.5–5.0)
Alkaline Phosphatase: 62 U/L (ref 38–126)
Anion gap: 18 — ABNORMAL HIGH (ref 5–15)
BUN: 99 mg/dL — ABNORMAL HIGH (ref 6–20)
CO2: 24 mmol/L (ref 22–32)
Calcium: 7.5 mg/dL — ABNORMAL LOW (ref 8.9–10.3)
Chloride: 92 mmol/L — ABNORMAL LOW (ref 98–111)
Creatinine, Ser: 19.13 mg/dL — ABNORMAL HIGH (ref 0.61–1.24)
GFR, Estimated: 3 mL/min — ABNORMAL LOW (ref >60)
Glucose, Bld: 127 mg/dL — ABNORMAL HIGH (ref 70–99)
Potassium: 5.5 mmol/L — ABNORMAL HIGH (ref 3.5–5.1)
Sodium: 134 mmol/L — ABNORMAL LOW (ref 135–145)
Total Bilirubin: 1.2 mg/dL (ref 0.3–1.2)
Total Protein: 6.7 g/dL (ref 6.5–8.1)

## 2020-07-22 LAB — CBC WITH DIFFERENTIAL/PLATELET
Abs Immature Granulocytes: 0.04 10*3/uL (ref 0.00–0.07)
Basophils Absolute: 0 10*3/uL (ref 0.0–0.1)
Basophils Relative: 0 %
Eosinophils Absolute: 0 10*3/uL (ref 0.0–0.5)
Eosinophils Relative: 0 %
HCT: 26.4 % — ABNORMAL LOW (ref 39.0–52.0)
Hemoglobin: 8.9 g/dL — ABNORMAL LOW (ref 13.0–17.0)
Immature Granulocytes: 0 %
Lymphocytes Relative: 7 %
Lymphs Abs: 0.6 10*3/uL — ABNORMAL LOW (ref 0.7–4.0)
MCH: 33 pg (ref 26.0–34.0)
MCHC: 33.7 g/dL (ref 30.0–36.0)
MCV: 97.8 fL (ref 80.0–100.0)
Monocytes Absolute: 0.4 10*3/uL (ref 0.1–1.0)
Monocytes Relative: 5 %
Neutro Abs: 8 10*3/uL — ABNORMAL HIGH (ref 1.7–7.7)
Neutrophils Relative %: 88 %
Platelets: 109 10*3/uL — ABNORMAL LOW (ref 150–400)
RBC: 2.7 MIL/uL — ABNORMAL LOW (ref 4.22–5.81)
RDW: 14.6 % (ref 11.5–15.5)
WBC: 9.1 10*3/uL (ref 4.0–10.5)
nRBC: 0 % (ref 0.0–0.2)

## 2020-07-22 LAB — PROCALCITONIN: Procalcitonin: 84.92 ng/mL

## 2020-07-22 MED ORDER — METHYLPREDNISOLONE SODIUM SUCC 40 MG IJ SOLR
40.0000 mg | Freq: Two times a day (BID) | INTRAMUSCULAR | Status: DC
  Administered 2020-07-22 – 2020-07-25 (×8): 40 mg via INTRAVENOUS
  Filled 2020-07-22 (×8): qty 1

## 2020-07-22 MED ORDER — LAMOTRIGINE 100 MG PO TABS
50.0000 mg | ORAL_TABLET | ORAL | Status: DC
  Administered 2020-07-22 – 2020-07-25 (×2): 50 mg via ORAL
  Filled 2020-07-22 (×2): qty 1

## 2020-07-22 MED ORDER — SODIUM CHLORIDE 0.9 % IV SOLN
100.0000 mg | Freq: Every day | INTRAVENOUS | Status: DC
  Administered 2020-07-23 – 2020-07-25 (×3): 100 mg via INTRAVENOUS
  Filled 2020-07-22 (×4): qty 20

## 2020-07-22 MED ORDER — DOXERCALCIFEROL 4 MCG/2ML IV SOLN
INTRAVENOUS | Status: AC
  Filled 2020-07-22: qty 2

## 2020-07-22 MED ORDER — SODIUM CHLORIDE 0.9 % IV SOLN
200.0000 mg | Freq: Once | INTRAVENOUS | Status: AC
  Administered 2020-07-22: 200 mg via INTRAVENOUS
  Filled 2020-07-22: qty 40

## 2020-07-22 MED ORDER — LAMOTRIGINE 5 MG PO CHEW
50.0000 mg | CHEWABLE_TABLET | ORAL | Status: DC
  Filled 2020-07-22: qty 10

## 2020-07-22 MED ORDER — LAMOTRIGINE 100 MG PO TABS
200.0000 mg | ORAL_TABLET | Freq: Two times a day (BID) | ORAL | Status: DC
  Administered 2020-07-22 – 2020-07-25 (×8): 200 mg via ORAL
  Filled 2020-07-22 (×8): qty 2

## 2020-07-22 MED ORDER — DOXERCALCIFEROL 4 MCG/2ML IV SOLN
3.0000 ug | INTRAVENOUS | Status: DC
  Administered 2020-07-22 – 2020-07-25 (×2): 3 ug via INTRAVENOUS

## 2020-07-22 NOTE — Progress Notes (Signed)
PROGRESS NOTE                                                                                                                                                                                                             Patient Demographics:    [name] [name], is a 40 y.o. male, DOB - 13-Jan-1981, JIR:[id number]  Outpatient Primary MD for the patient is Gildardo Pounds, NP    LOS - 2  Admit date - 07/20/2020    Chief Complaint  Patient presents with  . Seizures       Brief Narrative (HPI from H&P) - 40 y.o. M with PMH of ESRD on dialysis, seizures, HTN prior CVA and Lupus who presented to the ED after having seizure at dialysis.  Pt.was post-ictal requiring bagging on arrival, so was intubated in the ED for airway protection, he incidentally had Covid positive infection as well along with evidence of some fluid overload on chest x-ray.  He was seen by neurology stabilized in the ICU, extubated the next day and transferred to hospitalist service on 07/22/2020 on date 2 of his hospital stay.   Subjective:    [name] [name] today has, No headache, No chest pain, No abdominal pain - No Nausea, No new weakness tingling or numbness, no cough or SOB.   Assessment  & Plan :     1. Acute Covid 19 infection - with evidence of pneumonia versus fluid overload on chest x-ray, currently on steroids, since he is slightly hypoxic we will add Remdesivir as well and monitor.  He has sent it for Actemra use if needed.  Encouraged the patient to sit up in chair in the daytime use I-S and flutter valve for pulmonary toiletry and then prone in bed when at night.  Will advance activity and titrate down oxygen as possible.  Actemra/Baricitinib  off label use - patient was told that if COVID-19 pneumonitis gets worse we might potentially use Actemra off label, patient denies any known history of active diverticulitis, tuberculosis or hepatitis, understands  the risks and benefits and wants to proceed with Actemra treatment if required.     SpO2: 95 % O2 Flow Rate (L/min): 3 L/min FiO2 (%): 40 %  Recent Labs  Lab 07/20/20 1327 07/20/20 1352 07/20/20 1656 07/20/20 1726 07/21/20 0427 07/21/20 0439 07/21/20 7510 07/22/20 0101  WBC 8.7  --   --   --   --  6.4  --  9.1  HGB 10.8* 11.2*  --   --  10.5* 9.4*  --  8.9*  HCT 33.1* 33.0*  --   --  31.0* 27.8*  --  26.4*  PLT 76*  --   --   --   --  76*  --  109*  CRP  --   --  6.2*  --   --   --   --   --   BNP  --   --  1,024.6*  --   --   --   --   --   DDIMER  --   --   --  5.03*  --   --   --   --   PROCALCITON  --   --   --   --   --   --  71.93 84.92  AST 35  --   --   --   --   --   --  25  ALT 22  --   --   --   --   --   --  19  ALKPHOS 74  --   --   --   --   --   --  62  BILITOT 1.0  --   --   --   --   --   --  1.2  ALBUMIN 3.7  --   --   --   --   --   --  3.0*  INR 1.0  --   --   --   --   --   --   --   LATICACIDVEN  --   --  1.1  --   --   --   --   --     2.  Breakthrough seizure.  Seen by neurology, CT head and EEG nonacute, currently on Lamictal, pharmacy requested to adjust dose as recommended by neurology.  3.  ESRD.  Renal following.  4.  HX of lupus, hypercoagulable state with prior CVA.  On Eliquis.        Condition - Extremely Guarded  Family Communication  : Patient to update family by himself  Code Status :  Full  Consults  :  Neuro , PCCM   Procedures  :      CT Head - EEG - non acute.  Intubated on 07/20/2020 extubated to 07/21/20      PUD Prophylaxis : None  Disposition Plan  :    Status is: Inpatient  Remains inpatient appropriate because:IV treatments appropriate due to intensity of illness or inability to take PO   Dispo: The patient is from: Home              Anticipated d/c is to: Home              Anticipated d/c date is: 2 days              Patient currently is not medically stable to d/c.   Difficult to place patient  No  DVT Prophylaxis  :  Eliquis  Lab Results  Component Value Date   PLT 109 (L) 07/22/2020    Diet :  Diet Order            Diet renal with fluid restriction Fluid restriction: 2000 mL Fluid; Room service appropriate? Yes with Assist; Fluid consistency:  Thin  Diet effective now                  Inpatient Medications  Scheduled Meds: . apixaban  2.5 mg Oral BID  . chlorhexidine gluconate (MEDLINE KIT)  15 mL Mouth Rinse BID  . Chlorhexidine Gluconate Cloth  6 each Topical Daily  . lamoTRIgine  325 mg Oral Q breakfast   And  . lamoTRIgine  25 mg Oral QHS  . mouth rinse  15 mL Mouth Rinse BID  . [START ON 07/23/2020] predniSONE  50 mg Oral Daily   Followed by  . methylPREDNISolone (SOLU-MEDROL) injection  0.5 mg/kg Intravenous Q12H  . pantoprazole (PROTONIX) IV  40 mg Intravenous QHS   Continuous Infusions: PRN Meds:.docusate, polyethylene glycol  Antibiotics  :    Anti-infectives (From admission, onward)   None       Time Spent in minutes  30   Lala Lund M.D on 07/22/2020 at 9:30 AM  To page go to [email or web address]   [facility] -  Office  [phone]    See all Orders from today for further details    Objective:   Vitals:   07/22/20 0023 07/22/20 0351 07/22/20 0514 07/22/20 0757  BP: 129/83 115/73  129/79  Pulse: 66 73  69  Resp: _0 Temp: 98.5 F (36.9 C) 98.2 F (36.8 C)  98.6 F (37 C)  TempSrc: Axillary Oral  Oral  SpO2: 97% 97%  95%  Weight:   119.4 kg   Height:   _1  (1.88 m)     Wt Readings from Last 3 Encounters:  07/22/20 119.4 kg  04/29/20 106.6 kg  01/14/20 (!) 113.4 kg     Intake/Output Summary (Last 24 hours) at 07/22/2020 0930 Last data filed at 07/21/2020 2100 Gross per 24 hour  Intake 450.22 ml  Output -  Net 450.22 ml     Physical Exam  Awake Alert, No new F.N deficits, Normal affect [place].AT,PERRAL Supple Neck,No JVD, No cervical lymphadenopathy appriciated.  Symmetrical Chest wall movement, Good  air movement bilaterally, CTAB RRR,No Gallops,Rubs or new Murmurs, No Parasternal Heave +ve B.Sounds, Abd Soft, No tenderness, No organomegaly appriciated, No rebound - guarding or rigidity. No Cyanosis, Clubbing or edema, No new Rash or bruise       Data Review:    CBC Recent Labs  Lab 07/20/20 1327 07/20/20 1352 07/21/20 0427 07/21/20 0439 07/22/20 0101  WBC 8.7  --   --  6.4 9.1  HGB 10.8* 11.2* 10.5* 9.4* 8.9*  HCT 33.1* 33.0* 31.0* 27.8* 26.4*  PLT 76*  --   --  76* 109*  MCV 100.0  --   --  103.0* 97.8  MCH 32.6  --   --  34.8* 33.0  MCHC 32.6  --   --  33.8 33.7  RDW 14.9  --   --  14.9 14.6  LYMPHSABS 0.3*  --   --   --  0.6*  MONOABS 0.7  --   --   --  0.4  EOSABS 0.0  --   --   --  0.0  BASOSABS 0.0  --   --   --  0.0    Recent Labs  Lab 07/20/20 1307 07/20/20 1327 07/20/20 1352 07/20/20 1511 07/20/20 1656 07/20/20 1726 07/21/20 0427 07/21/20 0439 07/21/20 0922 07/22/20 0101  NA 136 137 136  --   --   --  134* 131*  --  134*  K 3.8 3.8  3.9  --   --   --  5.2* 5.1  --  5.5*  CL 96* 94*  --   --   --   --   --  93*  --  92*  CO2  --  22  --   --   --   --   --  21*  --  24  GLUCOSE 105* 106*  --   --   --   --   --  109*  --  127*  BUN 44* 41*  --   --   --   --   --  59*  --  99*  CREATININE 13.30* 12.63*  --   --   --   --   --  14.71*  --  19.13*  CALCIUM  --  8.0*  --   --   --   --   --  6.9*  --  7.5*  AST  --  35  --   --   --   --   --   --   --  25  ALT  --  22  --   --   --   --   --   --   --  19  ALKPHOS  --  74  --   --   --   --   --   --   --  62  BILITOT  --  1.0  --   --   --   --   --   --   --  1.2  ALBUMIN  --  3.7  --   --   --   --   --   --   --  3.0*  MG  --  2.6*  --   --   --   --   --  3.1*  --   --   CRP  --   --   --   --  6.2*  --   --   --   --   --   DDIMER  --   --   --   --   --  5.03*  --   --   --   --   PROCALCITON  --   --   --   --   --   --   --   --  71.93 84.92  LATICACIDVEN  --   --   --   --  1.1  --   --    --   --   --   INR  --  1.0  --   --   --   --   --   --   --   --   HGBA1C  --   --   --  5.0  --   --   --   --   --   --   BNP  --   --   --   --  1,024.6*  --   --   --   --   --     ------------------------------------------------------------------------------------------------------------------ No results for input(s): CHOL, HDL, LDLCALC, TRIG, CHOLHDL, LDLDIRECT in the last 72 hours.  Lab Results  Component Value Date   HGBA1C 5.0 07/20/2020   ------------------------------------------------------------------------------------------------------------------ No results for input(s): TSH, T4TOTAL, T3FREE, THYROIDAB in the last 72 hours.  Invalid input(s): FREET3  Cardiac Enzymes No results for input(s): CKMB,  TROPONINI, MYOGLOBIN in the last 168 hours.  Invalid input(s): CK ------------------------------------------------------------------------------------------------------------------    Component Value Date/Time   BNP 1,024.6 (H) 07/20/2020 1656    Micro Results Recent Results (from the past 240 hour(s))  Blood culture (routine x 2)     Status: None (Preliminary result)   Collection Time: 07/20/20  1:02 PM   Specimen: BLOOD RIGHT FOREARM  Result Value Ref Range Status   Specimen Description BLOOD RIGHT FOREARM  Final   Special Requests   Final    BOTTLES DRAWN AEROBIC AND ANAEROBIC Blood Culture adequate volume   Culture   Final    NO GROWTH < 24 HOURS Performed at [facility], [place] [address], [place], [place] [address]    Report Status PENDING  Incomplete  Blood culture (routine x 2)     Status: None (Preliminary result)   Collection Time: 07/20/20  4:56 PM   Specimen: BLOOD RIGHT FOREARM  Result Value Ref Range Status   Specimen Description BLOOD RIGHT FOREARM  Final   Special Requests   Final    BOTTLES DRAWN AEROBIC AND ANAEROBIC Blood Culture adequate volume   Culture   Final    NO GROWTH < 24 HOURS Performed at [facility], [place]  [address] [place], [place] [address]    Report Status PENDING  Incomplete  MRSA PCR Screening     Status: None   Collection Time: 07/20/20 11:22 PM   Specimen: Nasal Mucosa; Nasopharyngeal  Result Value Ref Range Status   MRSA by PCR NEGATIVE NEGATIVE Final    Comment:        The GeneXpert MRSA Assay (FDA approved for NASAL specimens only), is one component of a comprehensive MRSA colonization surveillance program. It is not intended to diagnose MRSA infection nor to guide or monitor treatment for MRSA infections. Performed at [facility], [place] [address] [place],  [address]     Radiology Reports EEG  Result Date: 07/20/2020 Lora Havens, MD     07/20/2020  6:35 PM Patient Name: VINTON LAYSON MRN: [id number] Epilepsy Attending: Lora Havens Referring Physician/Provider: Dr Kipp Brood Date: 07/19/2020 Duration: 24.55 mins Patient history: 40yo  M wit seizure like episode. EEG to evaluate for seizure Level of alertness:  Comatose AEDs during EEG study: LTG, LEV, Propofol Technical aspects: This EEG study was done with scalp electrodes positioned according to the 10-20 International system of electrode placement. Electrical activity was acquired at a sampling rate of _0  and reviewed with a high frequency filter of _1  and a low frequency filter of _2 . EEG data were recorded continuously and digitally stored. Description: EEG showed continuous generalized polymorphic mixed frequencies  With predominantly 5 to 6 Hz theta slowing admixed with intermittent generalized 2-_3  delta slowing at times with triphasic morphology.  Intermittent 15-_4  generalized beta activity was also noted. Hyperventilation and photic stimulation were not performed.   ABNORMALITY -Continuous slow, generalized IMPRESSION: This study is suggestive of severe diffuse encephalopathy, nonspecific etiology. No seizures or epileptiform discharges were seen throughout the recording. Dr Lynetta Mare was notified.  Lora Havens   CT HEAD WO CONTRAST  Result Date: 07/20/2020 CLINICAL DATA:  Seizure. EXAM: CT HEAD WITHOUT CONTRAST TECHNIQUE: Contiguous axial images were obtained from the base of the skull through the vertex without intravenous contrast. COMPARISON:  04/29/2019 FINDINGS: Brain: There is no evidence for acute hemorrhage, hydrocephalus, mass lesion, or abnormal extra-axial fluid collection. No definite CT evidence for acute infarction. Diffuse  loss of parenchymal volume is consistent with atrophy. Similar appearance of left parietal encephalomalacia with encephalomalacia also noted in the right frontal parietal region and right parietooccipital region. Lacunar infarcts noted left basal ganglia. Vascular: No hyperdense vessel or unexpected calcification. Skull: Normal. Negative for fracture or focal lesion. Sinuses/Orbits: Mild chronic mucosal thickening left maxillary sinus. Remaining visualized paranasal sinuses and mastoid air cells are clear. Other: None. IMPRESSION: 1. Stable.  No acute intracranial abnormality. 2. Atrophy with chronic small vessel disease and stable bilateral areas of supratentorial encephalomalacia suggesting old infarcts. Electronically Signed   By: Misty Stanley M.D.   On: 07/20/2020 14:50   DG Chest Portable 1 View  Result Date: 07/20/2020 CLINICAL DATA:  Intubation. EXAM: PORTABLE CHEST 1 VIEW COMPARISON:  09/20/2018. FINDINGS: Endotracheal tube and NG tube in good anatomic position. Cardiomegaly. Diffuse bilateral pulmonary infiltrates/edema noted on today's exam. No pleural effusion or pneumothorax. Left subclavian stent noted. IMPRESSION: 1. Lines and tubes in good anatomic position. 2. Cardiomegaly. Diffuse bilateral pulmonary infiltrates/edema noted on today's exam. Electronically Signed   By: Marcello Moores  Register   On: 07/20/2020 13:18

## 2020-07-22 NOTE — Progress Notes (Signed)
Patient will need to dialyze for at least 14 days past positive COVID test (07/20/20) at discharge at his home clinic/[facility], which is a 3rd shift TTS schedule. He should contact his clinic at discharge to inquire about time and protocol of this shift. Navigator attempted to reach patient by phone, but he did not answer. Navigator will attempt again if possible. COVID shift noted in AVS.  Alphonzo Cruise, [place] [facility] [phone]

## 2020-07-22 NOTE — Evaluation (Signed)
Physical Therapy Evaluation Patient Details Name: [name] [name] MRN: [id number] DOB: 30-Nov-1980 Today's Date: 07/22/2020   History of Present Illness  40 y.o. M with PMH of ESRD on dialysis, seizures, HTN prior CVA and Lupus who presented to the ED after having seizure at dialysis.  Pt.was post-ictal requiring bagging on arrival, so was intubated in the ED for airway protection, he incidentally had Covid positive infection as well along with evidence of some fluid overload on chest x-ray.  He was seen by neurology stabilized in the ICU, extubated the next day and transferred to hospitalist service on 07/22/2020 on date 2 of his hospital stay.    Clinical Impression  Pt admitted with above diagnosis. PTA pt living in first floor apartment with a roommate, independent. On eval, he required min guard assist transfers and ambulation 200' without AD. Pt primarily able to maintain SpO2 > 88% on RA. Desat to 85% during amb requiring brief return to 2L. SpO2 quickly improved to 100%. 2L removed and pt continued amb on RA. SpO2 91% at rest in recliner on RA. Pt currently with functional limitations due to the deficits listed below (see PT Problem List). Pt will benefit from skilled PT to increase their independence and safety with mobility to allow discharge home. PT to follow acutely. No follow up services indicated.        Follow Up Recommendations No PT follow up    Equipment Recommendations  None recommended by PT    Recommendations for Other Services       Precautions / Restrictions Precautions Precautions: Fall      Mobility  Bed Mobility Overal bed mobility: Modified Independent             General bed mobility comments: +rail, increased time    Transfers Overall transfer level: Needs assistance Equipment used: None Transfers: Sit to/from [employer] Transfers Sit to Stand: Min guard Stand pivot transfers: Min guard       General transfer comment: min guard for  safety  Ambulation/Gait Ambulation/Gait assistance: Min guard Gait Distance (Feet): 200 Feet Assistive device: None Gait Pattern/deviations: WFL(Within Functional Limits) Gait velocity: WFL Gait velocity interpretation: >2.62 ft/sec, indicative of community ambulatory General Gait Details: min guard for safety, assist with management of lines  Stairs            Wheelchair Mobility    Modified Rankin (Stroke Patients Only)       Balance Overall balance assessment: No apparent balance deficits (not formally assessed)                                           Pertinent Vitals/Pain Pain Assessment: Faces Faces Pain Scale: Hurts even more Pain Location: foley catheter site Pain Descriptors / Indicators: Discomfort;Grimacing;Guarding Pain Intervention(s): Monitored during session;Repositioned    Home Living Family/patient expects to be discharged to:: Private residence Living Arrangements: Non-relatives/Friends (roommate) Available Help at Discharge: Family;Friend(s);Available 24 hours/day Type of Home: Apartment Home Access: Level entry     Home Layout: One level Home Equipment: None      Prior Function Level of Independence: Independent         Comments: drives     Hand Dominance   Dominant Hand: Right    Extremity/Trunk Assessment   Upper Extremity Assessment Upper Extremity Assessment: Defer to OT evaluation    Lower Extremity Assessment Lower Extremity Assessment: Overall [facility]  for tasks assessed    Cervical / Trunk Assessment Cervical / Trunk Assessment: Normal  Communication   Communication: No difficulties  Cognition Arousal/Alertness: Awake/alert Behavior During Therapy: Flat affect Overall Cognitive Status: Within Functional Limits for tasks assessed                                        General Comments General comments (skin integrity, edema, etc.): Pt transitioned from 2L to RA this AM. On  arrival, SpO2 88% at rest on RA. Improved to 94% with initiation of gait. Pt began to desat to 85% after ambulating 100' requiring brief replacement of 2L. SpO2 improved to 100% and pt returned to RA. SpO2 91% on RA in recliner.    Exercises     Assessment/Plan    PT Assessment Patient needs continued PT services  PT Problem List Decreased mobility;Decreased activity tolerance;Cardiopulmonary status limiting activity       PT Treatment Interventions Therapeutic activities;Gait training;Therapeutic exercise;Patient/family education;Functional mobility training    PT Goals (Current goals can be found in the Care Plan section)  Acute Rehab PT Goals Patient Stated Goal: home PT Goal Formulation: With patient Time For Goal Achievement: 08/05/20 Potential to Achieve Goals: Good    Frequency Min 3X/week   Barriers to discharge        Co-evaluation               AM-PAC PT "6 Clicks" Mobility  Outcome Measure Help needed turning from your back to your side while in a flat bed without using bedrails?: None Help needed moving from lying on your back to sitting on the side of a flat bed without using bedrails?: A Little Help needed moving to and from a bed to a chair (including a wheelchair)?: A Little Help needed standing up from a chair using your arms (e.g., wheelchair or bedside chair)?: A Little Help needed to walk in hospital room?: A Little Help needed climbing 3-5 steps with a railing? : A Little 6 Click Score: 19    End of Session Equipment Utilized During Treatment: Gait belt;Oxygen Activity Tolerance: Patient tolerated treatment well Patient left: in chair;with call bell/phone within reach Nurse Communication: Mobility status PT Visit Diagnosis: Difficulty in walking, not elsewhere classified (R26.2)    Time: [id number] PT Time Calculation (min) (ACUTE ONLY): 34 min   Charges:   PT Evaluation $PT Eval Moderate Complexity: 1 Mod PT Treatments $Gait Training:  8-22 mins        Lorrin Goodell, PT  Office # [phone] Pager [phone]   Lorriane Shire 07/22/2020, 10:56 AM

## 2020-07-22 NOTE — Plan of Care (Signed)
Patient is currently resting in bed. OOB to BR, standby. VSS. Currently on 2L [place]. Seizure precautions in place. No complaints overnight. Call bell within reach. Bed alarm on.   Problem: Education: Goal: Knowledge of risk factors and measures for prevention of condition will improve Outcome: Progressing   Problem: Coping: Goal: Psychosocial and spiritual needs will be supported Outcome: Progressing   Problem: Respiratory: Goal: Will maintain a patent airway Outcome: Progressing Goal: Complications related to the disease process, condition or treatment will be avoided or minimized Outcome: Progressing

## 2020-07-22 NOTE — Progress Notes (Signed)
Patient ID: BENEDETTO RYDER, male   DOB: August 26, 1980, 40 y.o.   MRN: [id number] S: Feels much better O:BP 125/89 (BP Location: Right Arm)   Pulse 72   Temp 98.5 F (36.9 C) (Oral)   Resp 20   Ht _0  (1.88 m)   Wt 119.4 kg   SpO2 90%   BMI 33.80 kg/m   Intake/Output Summary (Last 24 hours) at 07/22/2020 1225 Last data filed at 07/22/2020 0900 Gross per 24 hour  Intake 557 ml  Output --  Net 557 ml   Intake/Output: I/O last 3 completed shifts: In: 941 [P.O.:437; I.V.:504] Out: 225 [Urine:125; Emesis/NG output:100]  Intake/Output this shift:  Total I/O In: 120 [P.O.:120] Out: -  Weight change: 6.4 kg Gen: Sitting in chair without oxygen on in NAD CVS: RRR Resp: cta Abd: benign Ext: no edema, LUE AVF +T/B  Recent Labs  Lab 07/20/20 1307 07/20/20 1327 07/20/20 1352 07/21/20 0427 07/21/20 0439 07/22/20 0101  NA 136 137 136 134* 131* 134*  K 3.8 3.8 3.9 5.2* 5.1 5.5*  CL 96* 94*  --   --  93* 92*  CO2  --  22  --   --  21* 24  GLUCOSE 105* 106*  --   --  109* 127*  BUN 44* 41*  --   --  59* 99*  CREATININE 13.30* 12.63*  --   --  14.71* 19.13*  ALBUMIN  --  3.7  --   --   --  3.0*  CALCIUM  --  8.0*  --   --  6.9* 7.5*  PHOS  --  4.6  --   --   --   --   AST  --  35  --   --   --  25  ALT  --  22  --   --   --  19   Liver Function Tests: Recent Labs  Lab 07/20/20 1327 07/22/20 0101  AST 35 25  ALT 22 19  ALKPHOS 74 62  BILITOT 1.0 1.2  PROT 7.8 6.7  ALBUMIN 3.7 3.0*   No results for input(s): LIPASE, AMYLASE in the last 168 hours. No results for input(s): AMMONIA in the last 168 hours. CBC: Recent Labs  Lab 07/20/20 1327 07/20/20 1352 07/21/20 0427 07/21/20 0439 07/22/20 0101  WBC 8.7  --   --  6.4 9.1  NEUTROABS 7.7  --   --   --  8.0*  HGB 10.8*   < > 10.5* 9.4* 8.9*  HCT 33.1*   < > 31.0* 27.8* 26.4*  MCV 100.0  --   --  103.0* 97.8  PLT 76*  --   --  76* 109*   < > = values in this interval not displayed.   Cardiac Enzymes: No results for  input(s): CKTOTAL, CKMB, CKMBINDEX, TROPONINI in the last 168 hours. CBG: Recent Labs  Lab 07/20/20 1658 07/20/20 1941 07/20/20 2329 07/21/20 0350 07/21/20 0839  GLUCAP 84 95 114* 123* 114*    Iron Studies:  Recent Labs    07/20/20 1656  FERRITIN 814*   Studies/Results: EEG  Result Date: 07/20/2020 Lora Havens, MD     07/20/2020  6:35 PM Patient Name: RANDOL ZUMSTEIN MRN: [id number] Epilepsy Attending: Lora Havens Referring Physician/Provider: Dr Kipp Brood Date: 07/19/2020 Duration: 24.55 mins Patient history: 40yo  M wit seizure like episode. EEG to evaluate for seizure Level of alertness:  Comatose AEDs during EEG study: LTG, LEV, Propofol  Technical aspects: This EEG study was done with scalp electrodes positioned according to the 10-20 International system of electrode placement. Electrical activity was acquired at a sampling rate of _0  and reviewed with a high frequency filter of _1  and a low frequency filter of _2 . EEG data were recorded continuously and digitally stored. Description: EEG showed continuous generalized polymorphic mixed frequencies  With predominantly 5 to 6 Hz theta slowing admixed with intermittent generalized 2-_3  delta slowing at times with triphasic morphology.  Intermittent 15-_4  generalized beta activity was also noted. Hyperventilation and photic stimulation were not performed.   ABNORMALITY -Continuous slow, generalized IMPRESSION: This study is suggestive of severe diffuse encephalopathy, nonspecific etiology. No seizures or epileptiform discharges were seen throughout the recording. Dr Lynetta Mare was notified. Lora Havens   CT HEAD WO CONTRAST  Result Date: 07/20/2020 CLINICAL DATA:  Seizure. EXAM: CT HEAD WITHOUT CONTRAST TECHNIQUE: Contiguous axial images were obtained from the base of the skull through the vertex without intravenous contrast. COMPARISON:  04/29/2019 FINDINGS: Brain: There is no evidence for acute hemorrhage,  hydrocephalus, mass lesion, or abnormal extra-axial fluid collection. No definite CT evidence for acute infarction. Diffuse loss of parenchymal volume is consistent with atrophy. Similar appearance of left parietal encephalomalacia with encephalomalacia also noted in the right frontal parietal region and right parietooccipital region. Lacunar infarcts noted left basal ganglia. Vascular: No hyperdense vessel or unexpected calcification. Skull: Normal. Negative for fracture or focal lesion. Sinuses/Orbits: Mild chronic mucosal thickening left maxillary sinus. Remaining visualized paranasal sinuses and mastoid air cells are clear. Other: None. IMPRESSION: 1. Stable.  No acute intracranial abnormality. 2. Atrophy with chronic small vessel disease and stable bilateral areas of supratentorial encephalomalacia suggesting old infarcts. Electronically Signed   By: Misty Stanley M.D.   On: 07/20/2020 14:50   DG Chest Portable 1 View  Result Date: 07/20/2020 CLINICAL DATA:  Intubation. EXAM: PORTABLE CHEST 1 VIEW COMPARISON:  09/20/2018. FINDINGS: Endotracheal tube and NG tube in good anatomic position. Cardiomegaly. Diffuse bilateral pulmonary infiltrates/edema noted on today's exam. No pleural effusion or pneumothorax. Left subclavian stent noted. IMPRESSION: 1. Lines and tubes in good anatomic position. 2. Cardiomegaly. Diffuse bilateral pulmonary infiltrates/edema noted on today's exam. Electronically Signed   By: Marcello Moores  Register   On: 07/20/2020 13:18   . apixaban  2.5 mg Oral BID  . chlorhexidine gluconate (MEDLINE KIT)  15 mL Mouth Rinse BID  . Chlorhexidine Gluconate Cloth  6 each Topical Daily  . lamoTRIgine  200 mg Oral BID  . lamoTRIgine  50 mg Oral Q M,W,F  . mouth rinse  15 mL Mouth Rinse BID  . methylPREDNISolone (SOLU-MEDROL) injection  40 mg Intravenous Q12H  . pantoprazole (PROTONIX) IV  40 mg Intravenous QHS    BMET    Component Value Date/Time   NA 134 (L) 07/22/2020 0101   NA 143  12/12/2017 1609   K 5.5 (H) 07/22/2020 0101   CL 92 (L) 07/22/2020 0101   CO2 24 07/22/2020 0101   GLUCOSE 127 (H) 07/22/2020 0101   BUN 99 (H) 07/22/2020 0101   BUN 36 (H) 12/12/2017 1609   CREATININE 19.13 (H) 07/22/2020 0101   CREATININE 2.06 (H) 10/24/2015 1603   CALCIUM 7.5 (L) 07/22/2020 0101   CALCIUM 8.4 (L) 12/23/2017 0237   GFRNONAA 3 (L) 07/22/2020 0101   GFRNONAA 40 (L) 10/24/2015 1603   GFRAA 4 (L) 01/14/2020 2020   GFRAA 47 (L) 10/24/2015 1603   CBC    Component Value Date/Time  WBC 9.1 07/22/2020 0101   RBC 2.70 (L) 07/22/2020 0101   HGB 8.9 (L) 07/22/2020 0101   HGB 11.6 (L) 12/12/2017 1609   HGB 15.5 10/14/2013 1434   HCT 26.4 (L) 07/22/2020 0101   HCT 34.7 (L) 12/12/2017 1609   HCT 45.6 10/14/2013 1434   PLT 109 (L) 07/22/2020 0101   PLT 134 (L) 12/12/2017 1609   MCV 97.8 07/22/2020 0101   MCV 88 12/12/2017 1609   MCV 87.2 10/14/2013 1434   MCH 33.0 07/22/2020 0101   MCHC 33.7 07/22/2020 0101   RDW 14.6 07/22/2020 0101   RDW 13.2 12/12/2017 1609   RDW 13.0 10/14/2013 1434   LYMPHSABS 0.6 (L) 07/22/2020 0101   LYMPHSABS 1.4 12/12/2017 1609   LYMPHSABS 1.4 10/14/2013 1434   MONOABS 0.4 07/22/2020 0101   MONOABS 0.5 10/14/2013 1434   EOSABS 0.0 07/22/2020 0101   EOSABS 0.2 12/12/2017 1609   BASOSABS 0.0 07/22/2020 0101   BASOSABS 0.0 12/12/2017 1609   BASOSABS 0.0 10/14/2013 1434   Outpatient Dialysis Orders:  Center: [facility]  on MWF. 200NRe, 3 hours, BFR 450, DFR 500, EDW 111.5kg, 2K/2Ca, AVG 15g Heparin 3700 unit bolus  Mircera 151mg q 2 weeks- last dose 07/20/20 Venofer 50 mg weekly  Hectorol 3 mcg qHD  Assessment/Plan: 1.  Breakthrough seizure: Suspected to be due to fever. Neurology on board. On lamictal.  2. COVID 19: Febrile, positive for covid on arrival. Started on steroids and isolation precautions. Mgt per primary.  3.  ESRD:  Dialyzes MWF. Did receive most of his dialysis yesterday but has been noncompliant lately, shortening and  missing treatments.  1. For HD today on covid shift and will keep on schedule. Dialysis treatment will be shortened due to high census/staffing shortage during COVID surge.  4.  Hypertension/volume: CXR with diffuse infiltrates vs. Pulmonary edema. He was noted to be volume overloaded and is 1.5kg over his EDW. Volume overload likely contributing to respiratory failure at least partially. Will attempt high UF goal with HD as tolerated. BP controlled, amlodipine on hold for now.  5.  Anemia: Hgb 9.4. ESA just given yesterday. Hold IV iron in setting of acute infection.  6.  Metabolic bone disease: Calcium 8.0 -> 6.9 today. Has been in the 8's outpatient. Continue hectorol, use high Ca bath and follow. Phos at goal.    JDonetta Potts MD [facility]([phone]

## 2020-07-22 NOTE — Progress Notes (Signed)
Pharmacy was asked to adjust lamotrigine per neurology recommendation. Lamotrigine '400mg'$  XR qhs with '50mg'$  prior to each HD. We don't carry the XR formulation here so we will do lamotrigine '200mg'$  BID here. Dr. Candiss Norse is aware.  Onnie Boer, PharmD, BCIDP, AAHIVP, CPP Infectious Disease Pharmacist 07/22/2020 10:47 AM

## 2020-07-22 NOTE — Evaluation (Addendum)
Occupational Therapy Evaluation Patient Details Name: [name] [name] MRN: [id number] DOB: 08-21-1980 Today's Date: 07/22/2020    History of Present Illness 40 y.o. M with PMH of ESRD on dialysis, seizures, HTN prior CVA and Lupus who presented to the ED after having seizure at dialysis.  Pt.was post-ictal requiring bagging on arrival, so was intubated in the ED for airway protection, he incidentally had Covid positive infection as well along with evidence of some fluid overload on chest x-ray.  He was seen by neurology stabilized in the ICU, extubated the next day and transferred to hospitalist service on 07/22/2020 on date 2 of his hospital stay.   Clinical Impression   Pt admitted with above. He demonstrates the below listed deficits and will benefit from continued OT to maximize safety and independence with BADLs.  Pt presents to OT with generalized weakness, decreased activity tolerance, and mild cognitive deficits as well as pain from his foley which limits his ability to perform LB ADLs.  He currently requires supervision - max A for ADLs (due to pain), and requires supervision for functional mobility.  He reports he lives with a roommate and was fully independent PTA including driving and working as a Freight forwarder at [employer].  Given his PLOF, I do not think his cognition is back to baseline, however, I do anticipate he will continue to improve.  Based on this assumption, I don't think he will require follow up OT, however, if cognition does not continue to improve, may need to change recommendation to OPOT.  Will continue to follow.        Follow Up Recommendations  No OT follow up;Supervision - Intermittent    Equipment Recommendations       Recommendations for Other Services       Precautions / Restrictions Precautions Precautions: Fall      Mobility Bed Mobility Overal bed mobility: Modified Independent             General bed mobility comments: pt sitting up in chair     Transfers Overall transfer level: Needs assistance Equipment used: None Transfers: Sit to/from Stand;Stand Pivot Transfers Sit to Stand: Supervision Stand pivot transfers: Supervision       General transfer comment: min guard for safety    Balance Overall balance assessment: No apparent balance deficits (not formally assessed)                                         ADL either performed or assessed with clinical judgement   ADL Overall ADL's : Needs assistance/impaired Eating/Feeding: Independent   Grooming: Wash/dry hands;Wash/dry face;Oral care;Brushing hair;Standing;Supervision/safety   Upper Body Bathing: Set up;Sitting;Supervision/ safety;Standing   Lower Body Bathing: Maximal assistance;Sit to/from stand Lower Body Bathing Details (indicate cue type and reason): limited due to pain from foley Upper Body Dressing : Set up;Supervision/safety;Sitting   Lower Body Dressing: Maximal assistance;Sit to/from stand Lower Body Dressing Details (indicate cue type and reason): unable to access feet due to pain from foley Toilet Transfer: Supervision/safety;Ambulation;Comfort height toilet   Toileting- Clothing Manipulation and Hygiene: Minimal assistance;Sit to/from stand Toileting - Clothing Manipulation Details (indicate cue type and reason): limited due to pain from foley     Functional mobility during ADLs: Supervision/safety       Vision         Perception     Praxis      Pertinent Vitals/Pain Pain  Assessment: Faces Faces Pain Scale: Hurts even more Pain Location: foley catheter site Pain Descriptors / Indicators: Discomfort;Grimacing;Guarding Pain Intervention(s): Monitored during session;Limited activity within patient's tolerance     Hand Dominance Right   Extremity/Trunk Assessment Upper Extremity Assessment Upper Extremity Assessment: Overall WFL for tasks assessed   Lower Extremity Assessment Lower Extremity Assessment: Overall  WFL for tasks assessed   Cervical / Trunk Assessment Cervical / Trunk Assessment: Normal   Communication Communication Communication: No difficulties   Cognition Arousal/Alertness: Awake/alert Behavior During Therapy: WFL for tasks assessed/performed;Flat affect Overall Cognitive Status: Impaired/Different from baseline Area of Impairment: Attention;Problem solving                   Current Attention Level: Selective;Alternating         Problem Solving: Slow processing;Difficulty sequencing General Comments: Pt slow to process info.  The Short Blessed Test was administered wtih pt scoring 7/28 which is indicative of questionable cognitive impairment.  He required increased time to complete the test and had to restart sequencing task several times.  Given his premorbid functional level, I would err that this is not his baseline   General Comments  VSS    Exercises     Shoulder Instructions      Home Living Family/patient expects to be discharged to:: Private residence Living Arrangements: Non-relatives/Friends Available Help at Discharge: Family;Friend(s);Available 24 hours/day Type of Home: Apartment Home Access: Level entry     Home Layout: One level     Bathroom Shower/Tub: Teacher, early years/pre: Standard     Home Equipment: None          Prior Functioning/Environment Level of Independence: Independent        Comments: drives.  works full time as a Freight forwarder at [employer] List: Decreased cognition;Decreased safety awareness;Pain      OT Treatment/Interventions: Self-care/ADL training;DME and/or AE instruction;Cognitive remediation/compensation;Therapeutic activities;Patient/family education    OT Goals(Current goals can be found in the care plan section) Acute Rehab OT Goals Patient Stated Goal: to go home OT Goal Formulation: With patient Time For Goal Achievement: 08/05/20 Potential to Achieve Goals: Good ADL  Goals Additional ADL Goal #1: Pt will be able to perform path finding task mod I Additional ADL Goal #2: Pt will be able to independently alternate and divide attention during novel task  OT Frequency: Min 2X/week   Barriers to D/C:            Co-evaluation              AM-PAC OT "6 Clicks" Daily Activity     Outcome Measure Help from another person eating meals?: None Help from another person taking care of personal grooming?: A Little Help from another person toileting, which includes using toliet, bedpan, or urinal?: A Little Help from another person bathing (including washing, rinsing, drying)?: A Little Help from another person to put on and taking off regular upper body clothing?: A Little Help from another person to put on and taking off regular lower body clothing?: A Little 6 Click Score: 19   End of Session Nurse Communication: Mobility status  Activity Tolerance: Patient tolerated treatment well;Patient limited by pain Patient left: in chair;with call bell/phone within reach;with nursing/sitter in room  OT Visit Diagnosis: Cognitive communication deficit (R41.841)                Time: [id number] OT Time Calculation (min): 32 min Charges:  OT  General Charges $OT Visit: 1 Visit OT Evaluation $OT Eval Moderate Complexity: 1 Mod OT Treatments $Self Care/Home Management : 8-22 mins  Nilsa Nutting., OTR/L Acute Rehabilitation Services Pager [phone] Office [phone]   Lucille Passy M 07/22/2020, 12:25 PM

## 2020-07-22 NOTE — Plan of Care (Signed)
  Problem: Safety: Goal: Non-violent Restraint(s) Outcome: Progressing   Problem: Education: Goal: Knowledge of General Education information will improve Description: Including pain rating scale, medication(s)/side effects and non-pharmacologic comfort measures Outcome: Progressing   Problem: Health Behavior/Discharge Planning: Goal: Ability to manage health-related needs will improve Outcome: Progressing   Problem: Clinical Measurements: Goal: Ability to maintain clinical measurements within normal limits will improve Outcome: Progressing Goal: Will remain free from infection Outcome: Progressing Goal: Diagnostic test results will improve Outcome: Progressing Goal: Respiratory complications will improve Outcome: Progressing Goal: Cardiovascular complication will be avoided Outcome: Progressing   Problem: Activity: Goal: Risk for activity intolerance will decrease Outcome: Progressing   Problem: Nutrition: Goal: Adequate nutrition will be maintained Outcome: Progressing   Problem: Coping: Goal: Level of anxiety will decrease Outcome: Progressing   Problem: Elimination: Goal: Will not experience complications related to bowel motility Outcome: Progressing Goal: Will not experience complications related to urinary retention Outcome: Progressing   Problem: Pain Managment: Goal: General experience of comfort will improve Outcome: Progressing   Problem: Safety: Goal: Ability to remain free from injury will improve Outcome: Progressing   Problem: Skin Integrity: Goal: Risk for impaired skin integrity will decrease Outcome: Progressing   Problem: Education: Goal: Knowledge of risk factors and measures for prevention of condition will improve Outcome: Progressing   Problem: Coping: Goal: Psychosocial and spiritual needs will be supported Outcome: Progressing   Problem: Respiratory: Goal: Will maintain a patent airway Outcome: Progressing Goal: Complications  related to the disease process, condition or treatment will be avoided or minimized Outcome: Progressing

## 2020-07-23 ENCOUNTER — Inpatient Hospital Stay (HOSPITAL_COMMUNITY): Payer: No Typology Code available for payment source

## 2020-07-23 LAB — CBC WITH DIFFERENTIAL/PLATELET
Abs Immature Granulocytes: 0.06 10*3/uL (ref 0.00–0.07)
Basophils Absolute: 0 10*3/uL (ref 0.0–0.1)
Basophils Relative: 0 %
Eosinophils Absolute: 0 10*3/uL (ref 0.0–0.5)
Eosinophils Relative: 0 %
HCT: 29.4 % — ABNORMAL LOW (ref 39.0–52.0)
Hemoglobin: 9.9 g/dL — ABNORMAL LOW (ref 13.0–17.0)
Immature Granulocytes: 1 %
Lymphocytes Relative: 6 %
Lymphs Abs: 0.6 10*3/uL — ABNORMAL LOW (ref 0.7–4.0)
MCH: 32.8 pg (ref 26.0–34.0)
MCHC: 33.7 g/dL (ref 30.0–36.0)
MCV: 97.4 fL (ref 80.0–100.0)
Monocytes Absolute: 0.4 10*3/uL (ref 0.1–1.0)
Monocytes Relative: 4 %
Neutro Abs: 8.4 10*3/uL — ABNORMAL HIGH (ref 1.7–7.7)
Neutrophils Relative %: 89 %
Platelets: 168 10*3/uL (ref 150–400)
RBC: 3.02 MIL/uL — ABNORMAL LOW (ref 4.22–5.81)
RDW: 14.7 % (ref 11.5–15.5)
WBC: 9.4 10*3/uL (ref 4.0–10.5)
nRBC: 0 % (ref 0.0–0.2)

## 2020-07-23 LAB — COMPREHENSIVE METABOLIC PANEL
ALT: 18 U/L (ref 0–44)
AST: 20 U/L (ref 15–41)
Albumin: 2.9 g/dL — ABNORMAL LOW (ref 3.5–5.0)
Alkaline Phosphatase: 65 U/L (ref 38–126)
Anion gap: 15 (ref 5–15)
BUN: 79 mg/dL — ABNORMAL HIGH (ref 6–20)
CO2: 24 mmol/L (ref 22–32)
Calcium: 8.5 mg/dL — ABNORMAL LOW (ref 8.9–10.3)
Chloride: 95 mmol/L — ABNORMAL LOW (ref 98–111)
Creatinine, Ser: 14.87 mg/dL — ABNORMAL HIGH (ref 0.61–1.24)
GFR, Estimated: 4 mL/min — ABNORMAL LOW (ref >60)
Glucose, Bld: 120 mg/dL — ABNORMAL HIGH (ref 70–99)
Potassium: 5 mmol/L (ref 3.5–5.1)
Sodium: 134 mmol/L — ABNORMAL LOW (ref 135–145)
Total Bilirubin: 0.6 mg/dL (ref 0.3–1.2)
Total Protein: 6.9 g/dL (ref 6.5–8.1)

## 2020-07-23 LAB — D-DIMER, QUANTITATIVE: D-Dimer, Quant: 0.86 ug/mL-FEU — ABNORMAL HIGH (ref 0.00–0.50)

## 2020-07-23 LAB — BRAIN NATRIURETIC PEPTIDE: B Natriuretic Peptide: 508.3 pg/mL — ABNORMAL HIGH (ref 0.0–100.0)

## 2020-07-23 LAB — MAGNESIUM: Magnesium: 3 mg/dL — ABNORMAL HIGH (ref 1.7–2.4)

## 2020-07-23 LAB — C-REACTIVE PROTEIN: CRP: 5.9 mg/dL — ABNORMAL HIGH (ref <1.0)

## 2020-07-23 LAB — HEPATITIS B CORE ANTIBODY, TOTAL: Hep B Core Total Ab: NONREACTIVE

## 2020-07-23 LAB — PROCALCITONIN: Procalcitonin: 79.84 ng/mL

## 2020-07-23 LAB — HEPATITIS B SURFACE ANTIGEN: Hepatitis B Surface Ag: NONREACTIVE

## 2020-07-23 NOTE — Progress Notes (Signed)
Patient ID: [name] [name], male   DOB: 12-04-1980, 40 y.o.   MRN: [id number] S: Feels better, no complaints. O:BP 127/85 (BP Location: Right Arm)   Pulse 62   Temp 98 F (36.7 C) (Oral)   Resp 14   Ht _0  (1.88 m)   Wt 112.3 kg   SpO2 94%   BMI 31.79 kg/m   Intake/Output Summary (Last 24 hours) at 07/23/2020 1137 Last data filed at 07/23/2020 1016 Gross per 24 hour  Intake 551.91 ml  Output 3000 ml  Net -2448.09 ml   Intake/Output: I/O last 3 completed shifts: In: 868.9 [P.O.:557; IV Piggyback:311.9] Out: 3000 [Other:3000]  Intake/Output this shift:  Total I/O In: 240 [P.O.:240] Out: -  Weight change: -2.1 kg Gen: NAD CVS: RRR Resp: cta Abd: +BS, soft, NT/ND Ext: no edema, LUE AVF +T/B  Recent Labs  Lab 07/20/20 1307 07/20/20 1327 07/20/20 1352 07/21/20 0427 07/21/20 0439 07/22/20 0101 07/23/20 0412  NA 136 137 136 134* 131* 134* 134*  K 3.8 3.8 3.9 5.2* 5.1 5.5* 5.0  CL 96* 94*  --   --  93* 92* 95*  CO2  --  22  --   --  21* 24 24  GLUCOSE 105* 106*  --   --  109* 127* 120*  BUN 44* 41*  --   --  59* 99* 79*  CREATININE 13.30* 12.63*  --   --  14.71* 19.13* 14.87*  ALBUMIN  --  3.7  --   --   --  3.0* 2.9*  CALCIUM  --  8.0*  --   --  6.9* 7.5* 8.5*  PHOS  --  4.6  --   --   --   --   --   AST  --  35  --   --   --  25 20  ALT  --  22  --   --   --  19 18   Liver Function Tests: Recent Labs  Lab 07/20/20 1327 07/22/20 0101 07/23/20 0412  AST 35 25 20  ALT _1 ALKPHOS 74 62 65  BILITOT 1.0 1.2 0.6  PROT 7.8 6.7 6.9  ALBUMIN 3.7 3.0* 2.9*   No results for input(s): LIPASE, AMYLASE in the last 168 hours. No results for input(s): AMMONIA in the last 168 hours. CBC: Recent Labs  Lab 07/20/20 1327 07/20/20 1352 07/21/20 0439 07/22/20 0101 07/23/20 0412  WBC 8.7  --  6.4 9.1 9.4  NEUTROABS 7.7  --   --  8.0* 8.4*  HGB 10.8*   < > 9.4* 8.9* 9.9*  HCT 33.1*   < > 27.8* 26.4* 29.4*  MCV 100.0  --  103.0* 97.8 97.4  PLT 76*  --  76* 109*  168   < > = values in this interval not displayed.   Cardiac Enzymes: No results for input(s): CKTOTAL, CKMB, CKMBINDEX, TROPONINI in the last 168 hours. CBG: Recent Labs  Lab 07/20/20 1658 07/20/20 1941 07/20/20 2329 07/21/20 0350 07/21/20 0839  GLUCAP 84 95 114* 123* 114*    Iron Studies:  Recent Labs    07/20/20 1656  FERRITIN 814*   Studies/Results: DG Chest Port 1 View  Result Date: 07/23/2020 CLINICAL DATA:  Shortness of breath.  COVID positive. EXAM: PORTABLE CHEST 1 VIEW COMPARISON:  One-view chest x-ray 07/20/2020 FINDINGS: The patient has been extubated. Heart is mildly enlarged. Lung volumes remain low. Aeration of both lungs is markedly improved. Minimal left basilar  airspace opacity remains. Minimal interstitial changes remain in the right lung. IMPRESSION: Marked improvement in aeration of both lungs with minimal left basilar airspace disease. Electronically Signed   By: San Morelle M.D.   On: 07/23/2020 08:44   . apixaban  2.5 mg Oral BID  . chlorhexidine gluconate (MEDLINE KIT)  15 mL Mouth Rinse BID  . Chlorhexidine Gluconate Cloth  6 each Topical Daily  . doxercalciferol  3 mcg Intravenous Q M,W,F-HD  . lamoTRIgine  200 mg Oral BID  . lamoTRIgine  50 mg Oral Q M,W,F  . mouth rinse  15 mL Mouth Rinse BID  . methylPREDNISolone (SOLU-MEDROL) injection  40 mg Intravenous Q12H  . pantoprazole (PROTONIX) IV  40 mg Intravenous QHS    BMET    Component Value Date/Time   NA 134 (L) 07/23/2020 0412   NA 143 12/12/2017 1609   K 5.0 07/23/2020 0412   CL 95 (L) 07/23/2020 0412   CO2 24 07/23/2020 0412   GLUCOSE 120 (H) 07/23/2020 0412   BUN 79 (H) 07/23/2020 0412   BUN 36 (H) 12/12/2017 1609   CREATININE 14.87 (H) 07/23/2020 0412   CREATININE 2.06 (H) 10/24/2015 1603   CALCIUM 8.5 (L) 07/23/2020 0412   CALCIUM 8.4 (L) 12/23/2017 0237   GFRNONAA 4 (L) 07/23/2020 0412   GFRNONAA 40 (L) 10/24/2015 1603   GFRAA 4 (L) 01/14/2020 2020   GFRAA 47 (L)  10/24/2015 1603   CBC    Component Value Date/Time   WBC 9.4 07/23/2020 0412   RBC 3.02 (L) 07/23/2020 0412   HGB 9.9 (L) 07/23/2020 0412   HGB 11.6 (L) 12/12/2017 1609   HGB 15.5 10/14/2013 1434   HCT 29.4 (L) 07/23/2020 0412   HCT 34.7 (L) 12/12/2017 1609   HCT 45.6 10/14/2013 1434   PLT 168 07/23/2020 0412   PLT 134 (L) 12/12/2017 1609   MCV 97.4 07/23/2020 0412   MCV 88 12/12/2017 1609   MCV 87.2 10/14/2013 1434   MCH 32.8 07/23/2020 0412   MCHC 33.7 07/23/2020 0412   RDW 14.7 07/23/2020 0412   RDW 13.2 12/12/2017 1609   RDW 13.0 10/14/2013 1434   LYMPHSABS 0.6 (L) 07/23/2020 0412   LYMPHSABS 1.4 12/12/2017 1609   LYMPHSABS 1.4 10/14/2013 1434   MONOABS 0.4 07/23/2020 0412   MONOABS 0.5 10/14/2013 1434   EOSABS 0.0 07/23/2020 0412   EOSABS 0.2 12/12/2017 1609   BASOSABS 0.0 07/23/2020 0412   BASOSABS 0.0 12/12/2017 1609   BASOSABS 0.0 10/14/2013 1434     OutpatientDialysis Orders:  Center:[facility] MWF. 200NRe, 3 hours, BFR 450, DFR 500, EDW 111.5kg, 2K/2Ca, AVG 15g Heparin 3700 unit bolus  Mircera 173mg q 2 weeks- last dose 07/20/20 Venofer 50 mg weekly  Hectorol 3 mcg qHD  Assessment/Plan: 1. Breakthrough seizure:Suspected to be due to fever. Neurology on board. On lamictal.  2. COVID 19: Febrile, positive for covid on arrival. Started on steroids and isolation precautions. Mgt per primary. 3. ESRD:Dialyzes MWF. Did receive most of his dialysis yesterday but has been noncompliant lately, shortening and missing treatments.  1. For HD today on covid shift and will keep on schedule. Dialysis treatment will be shortened due to high census/staffing shortage during COVID surge. 4. Hypertension/volume:CXR with diffuse infiltrates vs. Pulmonary edema. He was noted to be volume overloaded and is1.5kgover his EDW. Volume overload likely contributing to respiratory failure at least partially. Will attempt high UF goal with HD as tolerated. BP controlled,  amlodipine on hold for now. 5. Anemia:Hgb  9.4. ESA just given yesterday. Hold IV iron in setting of acute infection. 6. Metabolic bone disease:Calcium 8.0 -> 6.9 today. Has been in the 8's outpatient. Continue hectorol, use high Ca bath and follow. Phos at goal. 7. Disposition- hopeful discharge soon per Primary   Donetta Potts, MD [facility] [phone]

## 2020-07-23 NOTE — Progress Notes (Signed)
PROGRESS NOTE                                                                                                                                                                                                             Patient Demographics:    [name] [name], is a 40 y.o. male, DOB - 03-10-1981, CVE:[id number]  Outpatient Primary MD for the patient is Gildardo Pounds, NP    LOS - 3  Admit date - 07/20/2020    Chief Complaint  Patient presents with  . Seizures       Brief Narrative (HPI from H&P) - 40 y.o. M with PMH of ESRD on dialysis, seizures, HTN prior CVA and Lupus who presented to the ED after having seizure at dialysis.  Pt.was post-ictal requiring bagging on arrival, so was intubated in the ED for airway protection, he incidentally had Covid positive infection as well along with evidence of some fluid overload on chest x-ray.  He was seen by neurology stabilized in the ICU, extubated the next day and transferred to hospitalist service on 07/22/2020 on date 2 of his hospital stay.   Subjective:   Patient in bed, appears comfortable, denies any headache, no fever, no chest pain or pressure, no shortness of breath , no abdominal pain. No focal weakness.   Assessment  & Plan :     1. Acute Covid 19 infection - with evidence of pneumonia versus fluid overload on chest x-ray, bonding well to steroids and Remdesivir combination, continue to monitor currently on room air at rest.  He has sent it for Actemra use if needed.  Encouraged the patient to sit up in chair in the daytime use I-S and flutter valve for pulmonary toiletry and then prone in bed when at night.  Will advance activity and titrate down oxygen as possible.    Recent Labs  Lab 07/20/20 1327 07/20/20 1352 07/20/20 1656 07/20/20 1726 07/21/20 0427 07/21/20 0439 07/21/20 0922 07/22/20 0101 07/23/20 0412  WBC 8.7  --   --   --   --  6.4  --  9.1 9.4  HGB  10.8* 11.2*  --   --  10.5* 9.4*  --  8.9* 9.9*  HCT 33.1* 33.0*  --   --  31.0* 27.8*  --  26.4* 29.4*  PLT 76*  --   --   --   --  76*  --  109* 168  CRP  --   --  6.2*  --   --   --   --   --  5.9*  BNP  --   --  1,024.6*  --   --   --   --   --  508.3*  DDIMER  --   --   --  5.03*  --   --   --   --  0.86*  PROCALCITON  --   --   --   --   --   --  71.93 84.92 79.84  AST 35  --   --   --   --   --   --  25 20  ALT 22  --   --   --   --   --   --  19 18  ALKPHOS 74  --   --   --   --   --   --  62 65  BILITOT 1.0  --   --   --   --   --   --  1.2 0.6  ALBUMIN 3.7  --   --   --   --   --   --  3.0* 2.9*  INR 1.0  --   --   --   --   --   --   --   --   LATICACIDVEN  --   --  1.1  --   --   --   --   --   --     2.  Breakthrough seizure.  Seen by neurology, CT head and EEG nonacute, currently on Lamictal, pharmacy requested to adjust dose as recommended by neurology.  3.  ESRD.  Renal following.  4.  HX of lupus, hypercoagulable state with prior CVA.  On Eliquis.  5.  Extremely elevated procalcitonin levels.  No leukocytosis or fever, could be due to ESRD status, monitor clinically, follow-up blood cultures till they are final.      Condition - Extremely Guarded  Family Communication  : Patient to update family by himself  Code Status :  Full  Consults  :  Neuro , PCCM   Procedures  :      CT Head - EEG - non acute.  Intubated on 07/20/2020 extubated to 07/21/20      PUD Prophylaxis : None  Disposition Plan  :    Status is: Inpatient  Remains inpatient appropriate because:IV treatments appropriate due to intensity of illness or inability to take PO   Dispo: The patient is from: Home              Anticipated d/c is to: Home              Anticipated d/c date is: 2 days              Patient currently is not medically stable to d/c.   Difficult to place patient No  DVT Prophylaxis  :  Eliquis  Lab Results  Component Value Date   PLT 168 07/23/2020    Diet :   Diet Order            Diet renal with fluid restriction Fluid restriction: 2000 mL Fluid; Room service appropriate? Yes with Assist; Fluid consistency: Thin  Diet effective now                  Inpatient Medications  Scheduled  Meds: . apixaban  2.5 mg Oral BID  . chlorhexidine gluconate (MEDLINE KIT)  15 mL Mouth Rinse BID  . Chlorhexidine Gluconate Cloth  6 each Topical Daily  . doxercalciferol  3 mcg Intravenous Q M,W,F-HD  . lamoTRIgine  200 mg Oral BID  . lamoTRIgine  50 mg Oral Q M,W,F  . mouth rinse  15 mL Mouth Rinse BID  . methylPREDNISolone (SOLU-MEDROL) injection  40 mg Intravenous Q12H  . pantoprazole (PROTONIX) IV  40 mg Intravenous QHS   Continuous Infusions: . remdesivir 100 mg in NS 100 mL 100 mg (07/23/20 0840)   PRN Meds:.docusate, polyethylene glycol  Antibiotics  :    Anti-infectives (From admission, onward)   Start     Dose/Rate Route Frequency Ordered Stop   07/23/20 1000  remdesivir 100 mg in sodium chloride 0.9 % 100 mL IVPB        100 mg 200 mL/hr over 30 Minutes Intravenous Daily 07/22/20 1018 07/27/20 0959   07/22/20 1115  remdesivir 200 mg in sodium chloride 0.9% 250 mL IVPB        200 mg 580 mL/hr over 30 Minutes Intravenous Once 07/22/20 1018 07/22/20 1743       Time Spent in minutes  30   Lala Lund M.D on 07/23/2020 at 9:08 AM  To page go to [email or web address]   [facility] -  Office  [phone]    See all Orders from today for further details    Objective:   Vitals:   07/22/20 1700 07/22/20 2122 07/23/20 0000 07/23/20 0338  BP: 139/84 (!) 139/92  127/85  Pulse: 75 66  62  Resp: _0 Temp:  98.3 F (36.8 C)  98 F (36.7 C)  TempSrc: Oral Oral  Oral  SpO2: 92% 91% 90% 94%  Weight:    112.3 kg  Height:        Wt Readings from Last 3 Encounters:  07/23/20 112.3 kg  04/29/20 106.6 kg  01/14/20 (!) 113.4 kg     Intake/Output Summary (Last 24 hours) at 07/23/2020 0908 Last data filed at 07/22/2020  1844 Gross per 24 hour  Intake 311.91 ml  Output 3000 ml  Net -2688.09 ml     Physical Exam  Awake Alert, No new F.N deficits, Normal affect [place].AT,PERRAL Supple Neck,No JVD, No cervical lymphadenopathy appriciated.  Symmetrical Chest wall movement, Good air movement bilaterally, CTAB RRR,No Gallops, Rubs or new Murmurs, No Parasternal Heave +ve B.Sounds, Abd Soft, No tenderness, No organomegaly appriciated, No rebound - guarding or rigidity. No Cyanosis, Clubbing or edema, No new Rash or bruise     Data Review:    CBC Recent Labs  Lab 07/20/20 1327 07/20/20 1352 07/21/20 0427 07/21/20 0439 07/22/20 0101 07/23/20 0412  WBC 8.7  --   --  6.4 9.1 9.4  HGB 10.8* 11.2* 10.5* 9.4* 8.9* 9.9*  HCT 33.1* 33.0* 31.0* 27.8* 26.4* 29.4*  PLT 76*  --   --  76* 109* 168  MCV 100.0  --   --  103.0* 97.8 97.4  MCH 32.6  --   --  34.8* 33.0 32.8  MCHC 32.6  --   --  33.8 33.7 33.7  RDW 14.9  --   --  14.9 14.6 14.7  LYMPHSABS 0.3*  --   --   --  0.6* 0.6*  MONOABS 0.7  --   --   --  0.4 0.4  EOSABS 0.0  --   --   --  0.0 0.0  BASOSABS 0.0  --   --   --  0.0 0.0    Recent Labs  Lab 07/20/20 1307 07/20/20 1327 07/20/20 1352 07/20/20 1511 07/20/20 1656 07/20/20 1726 07/21/20 0427 07/21/20 0439 07/21/20 0922 07/22/20 0101 07/23/20 0412  NA 136 137 136  --   --   --  134* 131*  --  134* 134*  K 3.8 3.8 3.9  --   --   --  5.2* 5.1  --  5.5* 5.0  CL 96* 94*  --   --   --   --   --  93*  --  92* 95*  CO2  --  22  --   --   --   --   --  21*  --  24 24  GLUCOSE 105* 106*  --   --   --   --   --  109*  --  127* 120*  BUN 44* 41*  --   --   --   --   --  59*  --  99* 79*  CREATININE 13.30* 12.63*  --   --   --   --   --  14.71*  --  19.13* 14.87*  CALCIUM  --  8.0*  --   --   --   --   --  6.9*  --  7.5* 8.5*  AST  --  35  --   --   --   --   --   --   --  25 20  ALT  --  22  --   --   --   --   --   --   --  19 18  ALKPHOS  --  74  --   --   --   --   --   --   --  62 65   BILITOT  --  1.0  --   --   --   --   --   --   --  1.2 0.6  ALBUMIN  --  3.7  --   --   --   --   --   --   --  3.0* 2.9*  MG  --  2.6*  --   --   --   --   --  3.1*  --   --  3.0*  CRP  --   --   --   --  6.2*  --   --   --   --   --  5.9*  DDIMER  --   --   --   --   --  5.03*  --   --   --   --  0.86*  PROCALCITON  --   --   --   --   --   --   --   --  71.93 84.92 79.84  LATICACIDVEN  --   --   --   --  1.1  --   --   --   --   --   --   INR  --  1.0  --   --   --   --   --   --   --   --   --   HGBA1C  --   --   --  5.0  --   --   --   --   --   --   --  BNP  --   --   --   --  1,024.6*  --   --   --   --   --  508.3*    ------------------------------------------------------------------------------------------------------------------ No results for input(s): CHOL, HDL, LDLCALC, TRIG, CHOLHDL, LDLDIRECT in the last 72 hours.  Lab Results  Component Value Date   HGBA1C 5.0 07/20/2020   ------------------------------------------------------------------------------------------------------------------ No results for input(s): TSH, T4TOTAL, T3FREE, THYROIDAB in the last 72 hours.  Invalid input(s): FREET3  Cardiac Enzymes No results for input(s): CKMB, TROPONINI, MYOGLOBIN in the last 168 hours.  Invalid input(s): CK ------------------------------------------------------------------------------------------------------------------    Component Value Date/Time   BNP 508.3 (H) 07/23/2020 4944    Micro Results Recent Results (from the past 240 hour(s))  Blood culture (routine x 2)     Status: None (Preliminary result)   Collection Time: 07/20/20  1:02 PM   Specimen: BLOOD RIGHT FOREARM  Result Value Ref Range Status   Specimen Description BLOOD RIGHT FOREARM  Final   Special Requests   Final    BOTTLES DRAWN AEROBIC AND ANAEROBIC Blood Culture adequate volume   Culture   Final    NO GROWTH 2 DAYS Performed at [facility], [address] [place], [place] [address]     Report Status PENDING  Incomplete  Blood culture (routine x 2)     Status: None (Preliminary result)   Collection Time: 07/20/20  4:56 PM   Specimen: BLOOD RIGHT FOREARM  Result Value Ref Range Status   Specimen Description BLOOD RIGHT FOREARM  Final   Special Requests   Final    BOTTLES DRAWN AEROBIC AND ANAEROBIC Blood Culture adequate volume   Culture   Final    NO GROWTH 2 DAYS Performed at [facility], [place] [address] [place], [place] [address]    Report Status PENDING  Incomplete  MRSA PCR Screening     Status: None   Collection Time: 07/20/20 11:22 PM   Specimen: Nasal Mucosa; Nasopharyngeal  Result Value Ref Range Status   MRSA by PCR NEGATIVE NEGATIVE Final    Comment:        The GeneXpert MRSA Assay (FDA approved for NASAL specimens only), is one component of a comprehensive MRSA colonization surveillance program. It is not intended to diagnose MRSA infection nor to guide or monitor treatment for MRSA infections. Performed at [facility], [place] [address] [place], [place] [address]     Radiology Reports EEG  Result Date: 07/20/2020 Lora Havens, MD     07/20/2020  6:35 PM Patient Name: MILAM ALLBAUGH MRN: [id number] Epilepsy Attending: Lora Havens Referring Physician/Provider: Dr Kipp Brood Date: 07/19/2020 Duration: 24.55 mins Patient history: 40yo  M wit seizure like episode. EEG to evaluate for seizure Level of alertness:  Comatose AEDs during EEG study: LTG, LEV, Propofol Technical aspects: This EEG study was done with scalp electrodes positioned according to the 10-20 International system of electrode placement. Electrical activity was acquired at a sampling rate of 500Hz and reviewed with a high frequency filter of 70Hz and a low frequency filter of 1Hz. EEG data were recorded continuously and digitally stored. Description: EEG showed continuous generalized polymorphic mixed frequencies  With predominantly 5 to 6 Hz theta slowing admixed with  intermittent generalized 2-3Hz delta slowing at times with triphasic morphology.  Intermittent 15-18hz generalized beta activity was also noted. Hyperventilation and photic stimulation were not performed.   ABNORMALITY -Continuous slow, generalized IMPRESSION: This study is suggestive of  severe diffuse encephalopathy, nonspecific etiology. No seizures or epileptiform discharges were seen throughout the recording. Dr Lynetta Mare was notified. Lora Havens   CT HEAD WO CONTRAST  Result Date: 07/20/2020 CLINICAL DATA:  Seizure. EXAM: CT HEAD WITHOUT CONTRAST TECHNIQUE: Contiguous axial images were obtained from the base of the skull through the vertex without intravenous contrast. COMPARISON:  04/29/2019 FINDINGS: Brain: There is no evidence for acute hemorrhage, hydrocephalus, mass lesion, or abnormal extra-axial fluid collection. No definite CT evidence for acute infarction. Diffuse loss of parenchymal volume is consistent with atrophy. Similar appearance of left parietal encephalomalacia with encephalomalacia also noted in the right frontal parietal region and right parietooccipital region. Lacunar infarcts noted left basal ganglia. Vascular: No hyperdense vessel or unexpected calcification. Skull: Normal. Negative for fracture or focal lesion. Sinuses/Orbits: Mild chronic mucosal thickening left maxillary sinus. Remaining visualized paranasal sinuses and mastoid air cells are clear. Other: None. IMPRESSION: 1. Stable.  No acute intracranial abnormality. 2. Atrophy with chronic small vessel disease and stable bilateral areas of supratentorial encephalomalacia suggesting old infarcts. Electronically Signed   By: Misty Stanley M.D.   On: 07/20/2020 14:50   DG Chest Port 1 View  Result Date: 07/23/2020 CLINICAL DATA:  Shortness of breath.  COVID positive. EXAM: PORTABLE CHEST 1 VIEW COMPARISON:  One-view chest x-ray 07/20/2020 FINDINGS: The patient has been extubated. Heart is mildly enlarged. Lung volumes  remain low. Aeration of both lungs is markedly improved. Minimal left basilar airspace opacity remains. Minimal interstitial changes remain in the right lung. IMPRESSION: Marked improvement in aeration of both lungs with minimal left basilar airspace disease. Electronically Signed   By: San Morelle M.D.   On: 07/23/2020 08:44   DG Chest Portable 1 View  Result Date: 07/20/2020 CLINICAL DATA:  Intubation. EXAM: PORTABLE CHEST 1 VIEW COMPARISON:  09/20/2018. FINDINGS: Endotracheal tube and NG tube in good anatomic position. Cardiomegaly. Diffuse bilateral pulmonary infiltrates/edema noted on today's exam. No pleural effusion or pneumothorax. Left subclavian stent noted. IMPRESSION: 1. Lines and tubes in good anatomic position. 2. Cardiomegaly. Diffuse bilateral pulmonary infiltrates/edema noted on today's exam. Electronically Signed   By: Marcello Moores  Register   On: 07/20/2020 13:18

## 2020-07-24 LAB — MAGNESIUM: Magnesium: 3.1 mg/dL — ABNORMAL HIGH (ref 1.7–2.4)

## 2020-07-24 LAB — COMPREHENSIVE METABOLIC PANEL
ALT: 19 U/L (ref 0–44)
AST: 19 U/L (ref 15–41)
Albumin: 2.9 g/dL — ABNORMAL LOW (ref 3.5–5.0)
Alkaline Phosphatase: 66 U/L (ref 38–126)
Anion gap: 20 — ABNORMAL HIGH (ref 5–15)
BUN: 104 mg/dL — ABNORMAL HIGH (ref 6–20)
CO2: 21 mmol/L — ABNORMAL LOW (ref 22–32)
Calcium: 8.6 mg/dL — ABNORMAL LOW (ref 8.9–10.3)
Chloride: 93 mmol/L — ABNORMAL LOW (ref 98–111)
Creatinine, Ser: 17.2 mg/dL — ABNORMAL HIGH (ref 0.61–1.24)
GFR, Estimated: 3 mL/min — ABNORMAL LOW (ref >60)
Glucose, Bld: 122 mg/dL — ABNORMAL HIGH (ref 70–99)
Potassium: 5.2 mmol/L — ABNORMAL HIGH (ref 3.5–5.1)
Sodium: 134 mmol/L — ABNORMAL LOW (ref 135–145)
Total Bilirubin: 0.7 mg/dL (ref 0.3–1.2)
Total Protein: 6.8 g/dL (ref 6.5–8.1)

## 2020-07-24 LAB — PROCALCITONIN: Procalcitonin: 55.29 ng/mL

## 2020-07-24 LAB — CBC WITH DIFFERENTIAL/PLATELET
Abs Immature Granulocytes: 0.12 10*3/uL — ABNORMAL HIGH (ref 0.00–0.07)
Basophils Absolute: 0 10*3/uL (ref 0.0–0.1)
Basophils Relative: 0 %
Eosinophils Absolute: 0 10*3/uL (ref 0.0–0.5)
Eosinophils Relative: 0 %
HCT: 32.8 % — ABNORMAL LOW (ref 39.0–52.0)
Hemoglobin: 10.5 g/dL — ABNORMAL LOW (ref 13.0–17.0)
Immature Granulocytes: 1 %
Lymphocytes Relative: 8 %
Lymphs Abs: 0.8 10*3/uL (ref 0.7–4.0)
MCH: 31.5 pg (ref 26.0–34.0)
MCHC: 32 g/dL (ref 30.0–36.0)
MCV: 98.5 fL (ref 80.0–100.0)
Monocytes Absolute: 0.4 10*3/uL (ref 0.1–1.0)
Monocytes Relative: 5 %
Neutro Abs: 8.3 10*3/uL — ABNORMAL HIGH (ref 1.7–7.7)
Neutrophils Relative %: 86 %
Platelets: 199 10*3/uL (ref 150–400)
RBC: 3.33 MIL/uL — ABNORMAL LOW (ref 4.22–5.81)
RDW: 14.6 % (ref 11.5–15.5)
WBC: 9.7 10*3/uL (ref 4.0–10.5)
nRBC: 0.8 % — ABNORMAL HIGH (ref 0.0–0.2)

## 2020-07-24 LAB — C-REACTIVE PROTEIN: CRP: 4.2 mg/dL — ABNORMAL HIGH (ref <1.0)

## 2020-07-24 LAB — BRAIN NATRIURETIC PEPTIDE: B Natriuretic Peptide: 409.4 pg/mL — ABNORMAL HIGH (ref 0.0–100.0)

## 2020-07-24 LAB — D-DIMER, QUANTITATIVE: D-Dimer, Quant: 0.9 ug/mL-FEU — ABNORMAL HIGH (ref 0.00–0.50)

## 2020-07-24 MED ORDER — AMLODIPINE BESYLATE 10 MG PO TABS
10.0000 mg | ORAL_TABLET | Freq: Every day | ORAL | Status: DC
  Administered 2020-07-24 – 2020-07-25 (×2): 10 mg via ORAL
  Filled 2020-07-24 (×2): qty 1

## 2020-07-24 MED ORDER — HYDRALAZINE HCL 20 MG/ML IJ SOLN
10.0000 mg | Freq: Four times a day (QID) | INTRAMUSCULAR | Status: DC | PRN
  Administered 2020-07-24: 10 mg via INTRAVENOUS
  Filled 2020-07-24: qty 1

## 2020-07-24 MED ORDER — CARVEDILOL 6.25 MG PO TABS
6.2500 mg | ORAL_TABLET | Freq: Two times a day (BID) | ORAL | Status: DC
  Administered 2020-07-24 – 2020-07-25 (×4): 6.25 mg via ORAL
  Filled 2020-07-24 (×4): qty 1

## 2020-07-24 NOTE — Progress Notes (Signed)
Patient ID: KATRELL MILHORN, male   DOB: [birth year], 40 y.o.   MRN: [id number] S: No events overnight O:BP (!) 172/124   Pulse 69   Temp 98 F (36.7 C) (Axillary)   Resp (!) 25   Ht '6\' 2"'  (1.88 m)   Wt 112.3 kg   SpO2 97%   BMI 31.79 kg/m   Intake/Output Summary (Last 24 hours) at 07/24/2020 1003 Last data filed at 07/24/2020 0954 Gross per 24 hour  Intake 1200.39 ml  Output 2 ml  Net 1198.39 ml   Intake/Output: I/O last 3 completed shifts: In: 960.4 [P.O.:840; IV Piggyback:120.4] Out: -   Intake/Output this shift:  Total I/O In: 240 [P.O.:240] Out: 2 [Urine:1; Stool:1] Weight change:   Physical exam: unable to complete due to COVID + status.  In order to preserve PPE equipment and to minimize exposure to providers.  Notes from other caregivers reviewed   Recent Labs  Lab 07/20/20 1307 07/20/20 1327 07/20/20 1352 07/21/20 0427 07/21/20 0439 07/22/20 0101 07/23/20 0412 07/24/20 0359  NA 136 137 136 134* 131* 134* 134* 134*  K 3.8 3.8 3.9 5.2* 5.1 5.5* 5.0 5.2*  CL 96* 94*  --   --  93* 92* 95* 93*  CO2  --  22  --   --  21* 24 24 21*  GLUCOSE 105* 106*  --   --  109* 127* 120* 122*  BUN 44* 41*  --   --  59* 99* 79* 104*  CREATININE 13.30* 12.63*  --   --  14.71* 19.13* 14.87* 17.20*  ALBUMIN  --  3.7  --   --   --  3.0* 2.9* 2.9*  CALCIUM  --  8.0*  --   --  6.9* 7.5* 8.5* 8.6*  PHOS  --  4.6  --   --   --   --   --   --   AST  --  35  --   --   --  '25 20 19  ' ALT  --  22  --   --   --  '19 18 19   ' Liver Function Tests: Recent Labs  Lab 07/22/20 0101 07/23/20 0412 07/24/20 0359  AST '25 20 19  ' ALT '19 18 19  ' ALKPHOS 62 65 66  BILITOT 1.2 0.6 0.7  PROT 6.7 6.9 6.8  ALBUMIN 3.0* 2.9* 2.9*   No results for input(s): LIPASE, AMYLASE in the last 168 hours. No results for input(s): AMMONIA in the last 168 hours. CBC: Recent Labs  Lab 07/20/20 1327 07/20/20 1352 07/21/20 0439 07/22/20 0101 07/23/20 0412 07/24/20 0359  WBC 8.7  --  6.4 9.1 9.4 9.7   NEUTROABS 7.7  --   --  8.0* 8.4* 8.3*  HGB 10.8*   < > 9.4* 8.9* 9.9* 10.5*  HCT 33.1*   < > 27.8* 26.4* 29.4* 32.8*  MCV 100.0  --  103.0* 97.8 97.4 98.5  PLT 76*  --  76* 109* 168 199   < > = values in this interval not displayed.   Cardiac Enzymes: No results for input(s): CKTOTAL, CKMB, CKMBINDEX, TROPONINI in the last 168 hours. CBG: Recent Labs  Lab 07/20/20 1658 07/20/20 1941 07/20/20 2329 07/21/20 0350 07/21/20 0839  GLUCAP 84 95 114* 123* 114*    Iron Studies: No results for input(s): IRON, TIBC, TRANSFERRIN, FERRITIN in the last 72 hours. Studies/Results: DG Chest Port 1 View  Result Date: 07/23/2020 CLINICAL DATA:  Shortness of breath.  COVID  positive. EXAM: PORTABLE CHEST 1 VIEW COMPARISON:  One-view chest x-ray 07/20/2020 FINDINGS: The patient has been extubated. Heart is mildly enlarged. Lung volumes remain low. Aeration of both lungs is markedly improved. Minimal left basilar airspace opacity remains. Minimal interstitial changes remain in the right lung. IMPRESSION: Marked improvement in aeration of both lungs with minimal left basilar airspace disease. Electronically Signed   By: San Morelle M.D.   On: 07/23/2020 08:44   . amLODipine  10 mg Oral Daily  . apixaban  2.5 mg Oral BID  . chlorhexidine gluconate (MEDLINE KIT)  15 mL Mouth Rinse BID  . Chlorhexidine Gluconate Cloth  6 each Topical Daily  . doxercalciferol  3 mcg Intravenous Q M,W,F-HD  . lamoTRIgine  200 mg Oral BID  . lamoTRIgine  50 mg Oral Q M,W,F  . mouth rinse  15 mL Mouth Rinse BID  . methylPREDNISolone (SOLU-MEDROL) injection  40 mg Intravenous Q12H  . pantoprazole (PROTONIX) IV  40 mg Intravenous QHS    BMET    Component Value Date/Time   NA 134 (L) 07/24/2020 0359   NA 143 12/12/2017 1609   K 5.2 (H) 07/24/2020 0359   CL 93 (L) 07/24/2020 0359   CO2 21 (L) 07/24/2020 0359   GLUCOSE 122 (H) 07/24/2020 0359   BUN 104 (H) 07/24/2020 0359   BUN 36 (H) 12/12/2017 1609    CREATININE 17.20 (H) 07/24/2020 0359   CREATININE 2.06 (H) 10/24/2015 1603   CALCIUM 8.6 (L) 07/24/2020 0359   CALCIUM 8.4 (L) 12/23/2017 0237   GFRNONAA 3 (L) 07/24/2020 0359   GFRNONAA 40 (L) 10/24/2015 1603   GFRAA 4 (L) 01/14/2020 2020   GFRAA 47 (L) 10/24/2015 1603   CBC    Component Value Date/Time   WBC 9.7 07/24/2020 0359   RBC 3.33 (L) 07/24/2020 0359   HGB 10.5 (L) 07/24/2020 0359   HGB 11.6 (L) 12/12/2017 1609   HGB 15.5 10/14/2013 1434   HCT 32.8 (L) 07/24/2020 0359   HCT 34.7 (L) 12/12/2017 1609   HCT 45.6 10/14/2013 1434   PLT 199 07/24/2020 0359   PLT 134 (L) 12/12/2017 1609   MCV 98.5 07/24/2020 0359   MCV 88 12/12/2017 1609   MCV 87.2 10/14/2013 1434   MCH 31.5 07/24/2020 0359   MCHC 32.0 07/24/2020 0359   RDW 14.6 07/24/2020 0359   RDW 13.2 12/12/2017 1609   RDW 13.0 10/14/2013 1434   LYMPHSABS 0.8 07/24/2020 0359   LYMPHSABS 1.4 12/12/2017 1609   LYMPHSABS 1.4 10/14/2013 1434   MONOABS 0.4 07/24/2020 0359   MONOABS 0.5 10/14/2013 1434   EOSABS 0.0 07/24/2020 0359   EOSABS 0.2 12/12/2017 1609   BASOSABS 0.0 07/24/2020 0359   BASOSABS 0.0 12/12/2017 1609   BASOSABS 0.0 10/14/2013 1434      OutpatientDialysis Orders:  Center:[facility] MWF. 200NRe, 3 hours, BFR 450, DFR 500, EDW 111.5kg, 2K/2Ca, AVG 15g Heparin 3700 unit bolus  Mircera 169mg q 2 weeks- last dose 07/20/20 Venofer 50 mg weekly  Hectorol 3 mcg qHD  Assessment/Plan: 1. Breakthrough seizure:Suspected to be due to fever. Neurology on board. On lamictal.  2. COVID 19: Febrile, positive for covid on arrival. Started on steroids and isolation precautions. Mgt per primary. CRP and BNP improving.  SpO2 93-97% on room air 3. ESRD:Dialyzes MWF. Did receive most of his dialysis yesterday but has been noncompliant lately, shortening and missing treatments. 1. For HD tomorrow on covid shift and will keep on schedule.Dialysis treatment will be shortened due to  high census/staffing  shortage during COVID surge. 4. Hypertension/volume:CXR with diffuse infiltrates vs. Pulmonary edema. UF goal with HD as tolerated. BP controlled, amlodipine restarted. 5. Anemia:Hgb 9.4. ESA just given yesterday. Hold IV iron in setting of acute infection. 6. Metabolic bone disease:Calcium 8.0 -> 6.9 today. Has been in the 8's outpatient. Continue hectorol, use high Ca bath and follow. Phos at goal. 7. Disposition- hopeful discharge soon per Primary   Donetta Potts, MD [facility] [phone]

## 2020-07-24 NOTE — Progress Notes (Signed)
PROGRESS NOTE                                                                                                                                                                                                             Patient Demographics:    [name] [name], is a 40 y.o. male, DOB - Oct 03, 1980, URK:[id number]  Outpatient Primary MD for the patient is Gildardo Pounds, NP    LOS - 4  Admit date - 07/20/2020    Chief Complaint  Patient presents with  . Seizures       Brief Narrative (HPI from H&P) - 40 y.o. M with PMH of ESRD on dialysis, seizures, HTN prior CVA and Lupus who presented to the ED after having seizure at dialysis.  Pt.was post-ictal requiring bagging on arrival, so was intubated in the ED for airway protection, he incidentally had Covid positive infection as well along with evidence of some fluid overload on chest x-ray.  He was seen by neurology stabilized in the ICU, extubated the next day and transferred to hospitalist service on 07/22/2020 on date 2 of his hospital stay.   Subjective:   Patient in bed, appears comfortable, denies any headache, no fever, no chest pain or pressure, no shortness of breath , no abdominal pain. No focal weakness.   Assessment  & Plan :     1. Acute Covid 19 infection - with evidence of pneumonia versus fluid overload on chest x-ray, bonding well to steroids and Remdesivir combination, continue to monitor currently on room air at rest.  He has sent it for Actemra use if needed.  Encouraged the patient to sit up in chair in the daytime use I-S and flutter valve for pulmonary toiletry and then prone in bed when at night.  Will advance activity and titrate down oxygen as possible.    Recent Labs  Lab 07/20/20 1327 07/20/20 1352 07/20/20 1656 07/20/20 1726 07/21/20 0427 07/21/20 0439 07/21/20 0922 07/22/20 0101 07/23/20 0412 07/24/20 0359 07/24/20 0400  WBC 8.7  --   --   --    --  6.4  --  9.1 9.4 9.7  --   HGB 10.8*   < >  --   --  10.5* 9.4*  --  8.9* 9.9* 10.5*  --   HCT 33.1*   < >  --   --  31.0* 27.8*  --  26.4* 29.4* 32.8*  --   PLT 76*  --   --   --   --  76*  --  109* 168 199  --   CRP  --   --  6.2*  --   --   --   --   --  5.9* 4.2*  --   BNP  --   --  1,024.6*  --   --   --   --   --  508.3*  --  409.4*  DDIMER  --   --   --  5.03*  --   --   --   --  0.86* 0.90*  --   PROCALCITON  --   --   --   --   --   --  71.93 84.92 79.84 55.29  --   AST 35  --   --   --   --   --   --  '25 20 19  ' --   ALT 22  --   --   --   --   --   --  '19 18 19  ' --   ALKPHOS 74  --   --   --   --   --   --  62 65 66  --   BILITOT 1.0  --   --   --   --   --   --  1.2 0.6 0.7  --   ALBUMIN 3.7  --   --   --   --   --   --  3.0* 2.9* 2.9*  --   INR 1.0  --   --   --   --   --   --   --   --   --   --   LATICACIDVEN  --   --  1.1  --   --   --   --   --   --   --   --    < > = values in this interval not displayed.    2.  Breakthrough seizure.  Seen by neurology, CT head and EEG nonacute, currently on Lamictal, pharmacy requested to adjust dose as recommended by neurology.  3.  ESRD.  Renal following, he is on a modified dialysis schedule due to staffing constraints.  4.  HX of lupus, hypercoagulable state with prior CVA.  On Eliquis.  5.  Extremely elevated procalcitonin levels.  No leukocytosis or fever, could be due to ESRD status, monitor clinically, follow-up blood cultures till they are final.      Condition - Extremely Guarded  Family Communication  : Patient to update family by himself  Code Status :  Full  Consults  :  Neuro , PCCM   Procedures  :      CT Head - EEG - non acute.  Intubated on 07/20/2020 extubated to 07/21/20      PUD Prophylaxis : None  Disposition Plan  :    Status is: Inpatient  Remains inpatient appropriate because:IV treatments appropriate due to intensity of illness or inability to take PO   Dispo: The patient is from:  Home              Anticipated d/c is to: Home              Anticipated d/c date is: 2 days  Patient currently is not medically stable to d/c.   Difficult to place patient No  DVT Prophylaxis  :  Eliquis  Lab Results  Component Value Date   PLT 199 07/24/2020    Diet :  Diet Order            Diet renal with fluid restriction Fluid restriction: 2000 mL Fluid; Room service appropriate? Yes with Assist; Fluid consistency: Thin  Diet effective now                  Inpatient Medications  Scheduled Meds: . amLODipine  10 mg Oral Daily  . apixaban  2.5 mg Oral BID  . chlorhexidine gluconate (MEDLINE KIT)  15 mL Mouth Rinse BID  . Chlorhexidine Gluconate Cloth  6 each Topical Daily  . doxercalciferol  3 mcg Intravenous Q M,W,F-HD  . lamoTRIgine  200 mg Oral BID  . lamoTRIgine  50 mg Oral Q M,W,F  . mouth rinse  15 mL Mouth Rinse BID  . methylPREDNISolone (SOLU-MEDROL) injection  40 mg Intravenous Q12H  . pantoprazole (PROTONIX) IV  40 mg Intravenous QHS   Continuous Infusions: . remdesivir 100 mg in NS 100 mL 100 mg (07/24/20 0906)   PRN Meds:.docusate, hydrALAZINE, polyethylene glycol  Antibiotics  :    Anti-infectives (From admission, onward)   Start     Dose/Rate Route Frequency Ordered Stop   07/23/20 1000  remdesivir 100 mg in sodium chloride 0.9 % 100 mL IVPB        100 mg 200 mL/hr over 30 Minutes Intravenous Daily 07/22/20 1018 07/27/20 0959   07/22/20 1115  remdesivir 200 mg in sodium chloride 0.9% 250 mL IVPB        200 mg 580 mL/hr over 30 Minutes Intravenous Once 07/22/20 1018 07/22/20 1743       Time Spent in minutes  30   Lala Lund M.D on 07/24/2020 at 9:43 AM  To page go to [email or web address]   [facility] -  Office  [phone]    See all Orders from today for further details    Objective:   Vitals:   07/23/20 1315 07/23/20 2056 07/23/20 2300 07/24/20 0535  BP: (!) 156/107 (!) 162/115 (!) 148/88 (!) 149/105  Pulse: 68  69 74 65  Resp: '18 18  13  ' Temp: 97.9 F (36.6 C) 98.6 F (37 C)  98 F (36.7 C)  TempSrc: Oral Oral  Axillary  SpO2:  97%  93%  Weight:      Height:        Wt Readings from Last 3 Encounters:  07/23/20 112.3 kg  04/29/20 106.6 kg  01/14/20 (!) 113.4 kg     Intake/Output Summary (Last 24 hours) at 07/24/2020 0943 Last data filed at 07/24/2020 0840 Gross per 24 hour  Intake 1200.39 ml  Output -  Net 1200.39 ml     Physical Exam  Awake Alert, No new F.N deficits, Normal affect [place].AT,PERRAL Supple Neck,No JVD, No cervical lymphadenopathy appriciated.  Symmetrical Chest wall movement, Good air movement bilaterally, CTAB RRR,No Gallops, Rubs or new Murmurs, No Parasternal Heave +ve B.Sounds, Abd Soft, No tenderness, No organomegaly appriciated, No rebound - guarding or rigidity. No Cyanosis, Clubbing or edema, No new Rash or bruise      Data Review:    CBC Recent Labs  Lab 07/20/20 1327 07/20/20 1352 07/21/20 0427 07/21/20 0439 07/22/20 0101 07/23/20 0412 07/24/20 0359  WBC 8.7  --   --  6.4 9.1 9.4 9.7  HGB 10.8*   < > 10.5* 9.4* 8.9* 9.9* 10.5*  HCT 33.1*   < > 31.0* 27.8* 26.4* 29.4* 32.8*  PLT 76*  --   --  76* 109* 168 199  MCV 100.0  --   --  103.0* 97.8 97.4 98.5  MCH 32.6  --   --  34.8* 33.0 32.8 31.5  MCHC 32.6  --   --  33.8 33.7 33.7 32.0  RDW 14.9  --   --  14.9 14.6 14.7 14.6  LYMPHSABS 0.3*  --   --   --  0.6* 0.6* 0.8  MONOABS 0.7  --   --   --  0.4 0.4 0.4  EOSABS 0.0  --   --   --  0.0 0.0 0.0  BASOSABS 0.0  --   --   --  0.0 0.0 0.0   < > = values in this interval not displayed.    Recent Labs  Lab 07/20/20 1327 07/20/20 1352 07/20/20 1511 07/20/20 1656 07/20/20 1726 07/21/20 0427 07/21/20 0439 07/21/20 0922 07/22/20 0101 07/23/20 0412 07/24/20 0359 07/24/20 0400  NA 137   < >  --   --   --  134* 131*  --  134* 134* 134*  --   K 3.8   < >  --   --   --  5.2* 5.1  --  5.5* 5.0 5.2*  --   CL 94*  --   --   --   --   --  93*   --  92* 95* 93*  --   CO2 22  --   --   --   --   --  21*  --  24 24 21*  --   GLUCOSE 106*  --   --   --   --   --  109*  --  127* 120* 122*  --   BUN 41*  --   --   --   --   --  59*  --  99* 79* 104*  --   CREATININE 12.63*  --   --   --   --   --  14.71*  --  19.13* 14.87* 17.20*  --   CALCIUM 8.0*  --   --   --   --   --  6.9*  --  7.5* 8.5* 8.6*  --   AST 35  --   --   --   --   --   --   --  '25 20 19  ' --   ALT 22  --   --   --   --   --   --   --  '19 18 19  ' --   ALKPHOS 74  --   --   --   --   --   --   --  62 65 66  --   BILITOT 1.0  --   --   --   --   --   --   --  1.2 0.6 0.7  --   ALBUMIN 3.7  --   --   --   --   --   --   --  3.0* 2.9* 2.9*  --   MG 2.6*  --   --   --   --   --  3.1*  --   --  3.0* 3.1*  --   CRP  --   --   --  6.2*  --   --   --   --   --  5.9* 4.2*  --   DDIMER  --   --   --   --  5.03*  --   --   --   --  0.86* 0.90*  --   PROCALCITON  --   --   --   --   --   --   --  71.93 84.92 79.84 55.29  --   LATICACIDVEN  --   --   --  1.1  --   --   --   --   --   --   --   --   INR 1.0  --   --   --   --   --   --   --   --   --   --   --   HGBA1C  --   --  5.0  --   --   --   --   --   --   --   --   --   BNP  --   --   --  1,024.6*  --   --   --   --   --  508.3*  --  409.4*   < > = values in this interval not displayed.    ------------------------------------------------------------------------------------------------------------------ No results for input(s): CHOL, HDL, LDLCALC, TRIG, CHOLHDL, LDLDIRECT in the last 72 hours.  Lab Results  Component Value Date   HGBA1C 5.0 07/20/2020   ------------------------------------------------------------------------------------------------------------------ No results for input(s): TSH, T4TOTAL, T3FREE, THYROIDAB in the last 72 hours.  Invalid input(s): FREET3  Cardiac Enzymes No results for input(s): CKMB, TROPONINI, MYOGLOBIN in the last 168 hours.  Invalid input(s):  CK ------------------------------------------------------------------------------------------------------------------    Component Value Date/Time   BNP 409.4 (H) 07/24/2020 0400    Micro Results Recent Results (from the past 240 hour(s))  Blood culture (routine x 2)     Status: None (Preliminary result)   Collection Time: 07/20/20  1:02 PM   Specimen: BLOOD RIGHT FOREARM  Result Value Ref Range Status   Specimen Description BLOOD RIGHT FOREARM  Final   Special Requests   Final    BOTTLES DRAWN AEROBIC AND ANAEROBIC Blood Culture adequate volume   Culture   Final    NO GROWTH 3 DAYS Performed at [facility], [place] [address] [place], [place] [address]    Report Status PENDING  Incomplete  Blood culture (routine x 2)     Status: None (Preliminary result)   Collection Time: 07/20/20  4:56 PM   Specimen: BLOOD RIGHT FOREARM  Result Value Ref Range Status   Specimen Description BLOOD RIGHT FOREARM  Final   Special Requests   Final    BOTTLES DRAWN AEROBIC AND ANAEROBIC Blood Culture adequate volume   Culture   Final    NO GROWTH 3 DAYS Performed at [facility], [place] [address] [place], [place] [address]    Report Status PENDING  Incomplete  MRSA PCR Screening     Status: None   Collection Time: 07/20/20 11:22 PM   Specimen: Nasal Mucosa; Nasopharyngeal  Result Value Ref Range Status   MRSA by PCR NEGATIVE NEGATIVE Final    Comment:        The GeneXpert MRSA Assay (FDA approved for NASAL specimens only), is one component of a comprehensive MRSA colonization surveillance program. It is not intended to diagnose MRSA infection nor  to guide or monitor treatment for MRSA infections. Performed at [facility], [place] [address], [place], [place] [address]     Radiology Reports EEG  Result Date: 07/20/2020 Lora Havens, MD     07/20/2020  6:35 PM Patient Name: ANA LIAW MRN: [id number] Epilepsy Attending: Lora Havens Referring Physician/Provider:  Dr Kipp Brood Date: 07/19/2020 Duration: 24.55 mins Patient history: 40yo  M wit seizure like episode. EEG to evaluate for seizure Level of alertness:  Comatose AEDs during EEG study: LTG, LEV, Propofol Technical aspects: This EEG study was done with scalp electrodes positioned according to the 10-20 International system of electrode placement. Electrical activity was acquired at a sampling rate of '500Hz'  and reviewed with a high frequency filter of '70Hz'  and a low frequency filter of '1Hz' . EEG data were recorded continuously and digitally stored. Description: EEG showed continuous generalized polymorphic mixed frequencies  With predominantly 5 to 6 Hz theta slowing admixed with intermittent generalized 2-'3Hz'  delta slowing at times with triphasic morphology.  Intermittent 15-'18hz'  generalized beta activity was also noted. Hyperventilation and photic stimulation were not performed.   ABNORMALITY -Continuous slow, generalized IMPRESSION: This study is suggestive of severe diffuse encephalopathy, nonspecific etiology. No seizures or epileptiform discharges were seen throughout the recording. Dr Lynetta Mare was notified. Lora Havens   CT HEAD WO CONTRAST  Result Date: 07/20/2020 CLINICAL DATA:  Seizure. EXAM: CT HEAD WITHOUT CONTRAST TECHNIQUE: Contiguous axial images were obtained from the base of the skull through the vertex without intravenous contrast. COMPARISON:  04/29/2019 FINDINGS: Brain: There is no evidence for acute hemorrhage, hydrocephalus, mass lesion, or abnormal extra-axial fluid collection. No definite CT evidence for acute infarction. Diffuse loss of parenchymal volume is consistent with atrophy. Similar appearance of left parietal encephalomalacia with encephalomalacia also noted in the right frontal parietal region and right parietooccipital region. Lacunar infarcts noted left basal ganglia. Vascular: No hyperdense vessel or unexpected calcification. Skull: Normal. Negative for fracture or focal  lesion. Sinuses/Orbits: Mild chronic mucosal thickening left maxillary sinus. Remaining visualized paranasal sinuses and mastoid air cells are clear. Other: None. IMPRESSION: 1. Stable.  No acute intracranial abnormality. 2. Atrophy with chronic small vessel disease and stable bilateral areas of supratentorial encephalomalacia suggesting old infarcts. Electronically Signed   By: Misty Stanley M.D.   On: 07/20/2020 14:50   DG Chest Port 1 View  Result Date: 07/23/2020 CLINICAL DATA:  Shortness of breath.  COVID positive. EXAM: PORTABLE CHEST 1 VIEW COMPARISON:  One-view chest x-ray 07/20/2020 FINDINGS: The patient has been extubated. Heart is mildly enlarged. Lung volumes remain low. Aeration of both lungs is markedly improved. Minimal left basilar airspace opacity remains. Minimal interstitial changes remain in the right lung. IMPRESSION: Marked improvement in aeration of both lungs with minimal left basilar airspace disease. Electronically Signed   By: San Morelle M.D.   On: 07/23/2020 08:44   DG Chest Portable 1 View  Result Date: 07/20/2020 CLINICAL DATA:  Intubation. EXAM: PORTABLE CHEST 1 VIEW COMPARISON:  09/20/2018. FINDINGS: Endotracheal tube and NG tube in good anatomic position. Cardiomegaly. Diffuse bilateral pulmonary infiltrates/edema noted on today's exam. No pleural effusion or pneumothorax. Left subclavian stent noted. IMPRESSION: 1. Lines and tubes in good anatomic position. 2. Cardiomegaly. Diffuse bilateral pulmonary infiltrates/edema noted on today's exam. Electronically Signed   By: Marcello Moores  Register   On: 07/20/2020 13:18

## 2020-07-24 NOTE — Progress Notes (Signed)
Occupational Therapy Treatment Patient Details Name: [name] [name] MRN: [id number] DOB: [birth year] Today's Date: 07/24/2020    History of present illness     OT comments  Pt. Seen for skilled OT treatment session.  Focus on cognition through use of iadls with a list of tasks for pt. To follow. Improvement with sequencing from previously noted visit.  Able to handle distraction and resume tasks without cues.    Follow Up Recommendations  No OT follow up;Supervision - Intermittent    Equipment Recommendations       Recommendations for Other Services      Precautions / Restrictions Restrictions Weight Bearing Restrictions: No       Mobility Bed Mobility Overal bed mobility: Modified Independent                Transfers Overall transfer level: Needs assistance Equipment used: None Transfers: Sit to/from Stand;Stand Pivot Transfers Sit to Stand: Supervision Stand pivot transfers: Supervision       General transfer comment: min guard for safety    Balance                                           ADL either performed or assessed with clinical judgement   ADL Overall ADL's : Needs assistance/impaired     Grooming: Wash/dry hands;Wash/dry face;Oral care;Supervision/safety;Standing               Lower Body Dressing: Supervision/safety;Sitting/lateral leans               Functional mobility during ADLs: Supervision/safety General ADL Comments: utilized in room iadls for cont. cognitive assessment.  provided list of tasks and had pt. complete. ie: make the bed, wash hands, perform oral care, and clean mess that he had made.  pt. able to list what task was next with out cues or reminding.  interupted tasks with questions or other directions and pt. able to regroup and complete tasks.     Vision       Perception     Praxis      Cognition Arousal/Alertness: Awake/alert Behavior During Therapy: WFL for tasks  assessed/performed;Flat affect Overall Cognitive Status: Impaired/Different from baseline Area of Impairment: Attention;Problem solving                   Current Attention Level: Selective;Alternating           General Comments: able to demonstrate sequecing today via teeth brushing.  no cues provided and pt. able to complete all steps without any hesitation, same for washing hands and face.  asking appropriate questions also, demonstrating awareness ie: pointed to sensor on finger and states "what should i do about this" prior to just washing.  able to make a bed without cues and demonstrated proper sequencing ie: adj. sheet and pillows then pulled up main blanket and even folded it down for esthetic look.        Exercises     Shoulder Instructions       General Comments  pt. Had a large mess on the side of his bed with his belongings and some food. States he got mad and "threw things" when he was told he could not go home.  rn aware.  Had pt. Assist with cleaning up and he stated he realized he shouldn't have but had just been mad in the moment.  Pertinent Vitals/ Pain       Pain Assessment: No/denies pain  Home Living                                          Prior Functioning/Environment              Frequency  Min 2X/week        Progress Toward Goals  OT Goals(current goals can now be found in the care plan section)  Progress towards OT goals: Progressing toward goals     Plan      Co-evaluation                 AM-PAC OT "6 Clicks" Daily Activity     Outcome Measure   Help from another person eating meals?: None Help from another person taking care of personal grooming?: A Little Help from another person toileting, which includes using toliet, bedpan, or urinal?: A Little Help from another person bathing (including washing, rinsing, drying)?: A Little Help from another person to put on and taking off regular upper  body clothing?: A Little Help from another person to put on and taking off regular lower body clothing?: A Little 6 Click Score: 19    End of Session    OT Visit Diagnosis: Cognitive communication deficit (R41.841)   Activity Tolerance Patient tolerated treatment well   Patient Left in chair;with call bell/phone within reach;with nursing/sitter in room   Nurse Communication Other (comment) (rn in room checking bp at end of session)        Time: [id number] OT Time Calculation (min): 19 min  Charges: OT General Charges $OT Visit: 1 Visit OT Treatments $Self Care/Home Management : 8-22 mins  Sonia Baller, COTA/L Acute Rehabilitation [phone]   Janice Coffin 07/24/2020, 12:34 PM

## 2020-07-25 ENCOUNTER — Other Ambulatory Visit (HOSPITAL_COMMUNITY): Payer: Self-pay | Admitting: Internal Medicine

## 2020-07-25 LAB — CBC WITH DIFFERENTIAL/PLATELET
Abs Immature Granulocytes: 0.12 10*3/uL — ABNORMAL HIGH (ref 0.00–0.07)
Basophils Absolute: 0 10*3/uL (ref 0.0–0.1)
Basophils Relative: 0 %
Eosinophils Absolute: 0 10*3/uL (ref 0.0–0.5)
Eosinophils Relative: 0 %
HCT: 33.4 % — ABNORMAL LOW (ref 39.0–52.0)
Hemoglobin: 11.2 g/dL — ABNORMAL LOW (ref 13.0–17.0)
Immature Granulocytes: 2 %
Lymphocytes Relative: 11 %
Lymphs Abs: 0.9 10*3/uL (ref 0.7–4.0)
MCH: 32.3 pg (ref 26.0–34.0)
MCHC: 33.5 g/dL (ref 30.0–36.0)
MCV: 96.3 fL (ref 80.0–100.0)
Monocytes Absolute: 0.4 10*3/uL (ref 0.1–1.0)
Monocytes Relative: 5 %
Neutro Abs: 6.8 10*3/uL (ref 1.7–7.7)
Neutrophils Relative %: 82 %
Platelets: 221 10*3/uL (ref 150–400)
RBC: 3.47 MIL/uL — ABNORMAL LOW (ref 4.22–5.81)
RDW: 14.7 % (ref 11.5–15.5)
WBC: 8.2 10*3/uL (ref 4.0–10.5)
nRBC: 1.2 % — ABNORMAL HIGH (ref 0.0–0.2)

## 2020-07-25 LAB — COMPREHENSIVE METABOLIC PANEL
ALT: 21 U/L (ref 0–44)
AST: 18 U/L (ref 15–41)
Albumin: 3 g/dL — ABNORMAL LOW (ref 3.5–5.0)
Alkaline Phosphatase: 67 U/L (ref 38–126)
Anion gap: 20 — ABNORMAL HIGH (ref 5–15)
BUN: 126 mg/dL — ABNORMAL HIGH (ref 6–20)
CO2: 19 mmol/L — ABNORMAL LOW (ref 22–32)
Calcium: 9.2 mg/dL (ref 8.9–10.3)
Chloride: 94 mmol/L — ABNORMAL LOW (ref 98–111)
Creatinine, Ser: 19.6 mg/dL — ABNORMAL HIGH (ref 0.61–1.24)
GFR, Estimated: 3 mL/min — ABNORMAL LOW (ref >60)
Glucose, Bld: 174 mg/dL — ABNORMAL HIGH (ref 70–99)
Potassium: 4.7 mmol/L (ref 3.5–5.1)
Sodium: 133 mmol/L — ABNORMAL LOW (ref 135–145)
Total Bilirubin: 0.7 mg/dL (ref 0.3–1.2)
Total Protein: 6.8 g/dL (ref 6.5–8.1)

## 2020-07-25 LAB — CULTURE, BLOOD (ROUTINE X 2)
Culture: NO GROWTH
Culture: NO GROWTH
Special Requests: ADEQUATE
Special Requests: ADEQUATE

## 2020-07-25 LAB — D-DIMER, QUANTITATIVE: D-Dimer, Quant: 0.69 ug/mL-FEU — ABNORMAL HIGH (ref 0.00–0.50)

## 2020-07-25 LAB — HEPATITIS B SURFACE ANTIBODY, QUANTITATIVE: Hep B S AB Quant (Post): >1000 m[IU]/mL (ref >9.9)

## 2020-07-25 LAB — C-REACTIVE PROTEIN: CRP: 2.8 mg/dL — ABNORMAL HIGH (ref <1.0)

## 2020-07-25 LAB — MAGNESIUM: Magnesium: 3.1 mg/dL — ABNORMAL HIGH (ref 1.7–2.4)

## 2020-07-25 LAB — PROCALCITONIN: Procalcitonin: 36.59 ng/mL

## 2020-07-25 LAB — BRAIN NATRIURETIC PEPTIDE: B Natriuretic Peptide: 999.6 pg/mL — ABNORMAL HIGH (ref 0.0–100.0)

## 2020-07-25 MED ORDER — LAMOTRIGINE 25 MG PO TABS: 50.0000 mg | ORAL_TABLET | ORAL | 0 refills | Status: DC

## 2020-07-25 MED ORDER — DOXERCALCIFEROL 4 MCG/2ML IV SOLN
INTRAVENOUS | Status: AC
  Filled 2020-07-25: qty 2

## 2020-07-25 MED ORDER — HEPARIN SODIUM (PORCINE) 1000 UNIT/ML IJ SOLN
INTRAMUSCULAR | Status: AC
  Administered 2020-07-25: 1000 [IU]
  Filled 2020-07-25: qty 4

## 2020-07-25 MED ORDER — CARVEDILOL 6.25 MG PO TABS: 6.2500 mg | ORAL_TABLET | Freq: Two times a day (BID) | ORAL | 0 refills | Status: DC

## 2020-07-25 MED ORDER — PANTOPRAZOLE SODIUM 40 MG PO TBEC
40.0000 mg | DELAYED_RELEASE_TABLET | Freq: Every day | ORAL | Status: DC
  Administered 2020-07-25: 40 mg via ORAL
  Filled 2020-07-25: qty 1

## 2020-07-25 MED ORDER — LAMOTRIGINE 200 MG PO TABS: 200.0000 mg | ORAL_TABLET | Freq: Two times a day (BID) | ORAL | 0 refills | Status: DC

## 2020-07-25 MED FILL — lamoTRIgine 25 MG TABS: 25 | 30 days supply | Qty: 30 | Fill #0

## 2020-07-25 MED FILL — lamoTRIgine 100 MG TABS: 100 | 30 days supply | Qty: 120 | Fill #0

## 2020-07-25 MED FILL — CARVEDILOL 6.25 MG TABLET: 6.25 | 30 days supply | Qty: 60 | Fill #0

## 2020-07-25 NOTE — Progress Notes (Signed)
Physical Therapy Treatment/Discharge Patient Details Name: [name] [name] MRN: [id number] DOB: [birth year] Today's Date: 07/25/2020    History of Present Illness 40 y.o. M with PMH of ESRD on dialysis, seizures, HTN prior CVA and Lupus who presented to the ED after having seizure at dialysis.  Pt.was post-ictal requiring bagging on arrival, so was intubated in the ED for airway protection, he incidentally had Covid positive infection as well along with evidence of some fluid overload on chest x-ray.  He was seen by neurology stabilized in the ICU, extubated the next day and transferred to hospitalist service on 07/22/2020 on date 2 of his hospital stay.    PT Comments    Pt ambulated ~900' today, no AD, very mildly staggering gait pattern, HR 160s during gait, no symptoms, on RA with no DOE and O2 sats stable on RA during gait.  Pt due to d/c home possibly today.  No PT follow up needed acutely or after d/c.  PT to sign off.    Follow Up Recommendations  No PT follow up     Equipment Recommendations  None recommended by PT    Recommendations for Other Services       Precautions / Restrictions Precautions Precautions: None    Mobility  Bed Mobility Overal bed mobility: Independent                Transfers Overall transfer level: Independent                  Ambulation/Gait Ambulation/Gait assistance: Supervision Gait Distance (Feet): 900 Feet Assistive device: None Gait Pattern/deviations: Staggering left;Staggering right Gait velocity: .   General Gait Details: Pt with mildly staggering gait pattern, improved with increased distance, but still not normal.  Pt self reported he felt a bit "off" on his feet.  HR 160s at one point during gait, pt completely asymptomatic.  O2 sats WNL on RA   Stairs             Wheelchair Mobility    Modified Rankin (Stroke Patients Only)       Balance Overall balance assessment: Mild deficits observed, not formally  tested                                          Cognition Arousal/Alertness: Awake/alert Behavior During Therapy: WFL for tasks assessed/performed Overall Cognitive Status: Within Functional Limits for tasks assessed                                        Exercises      General Comments        Pertinent Vitals/Pain Pain Assessment: No/denies pain    Home Living                      Prior Function            PT Goals (current goals can now be found in the care plan section) Acute Rehab PT Goals PT Goal Formulation: All assessment and education complete, DC therapy Progress towards PT goals: Progressing toward goals;Goals met/education completed, patient discharged from PT    Frequency    Min 3X/week      PT Plan Current plan remains appropriate    Co-evaluation  AM-PAC PT "6 Clicks" Mobility   Outcome Measure  Help needed turning from your back to your side while in a flat bed without using bedrails?: None Help needed moving from lying on your back to sitting on the side of a flat bed without using bedrails?: None Help needed moving to and from a bed to a chair (including a wheelchair)?: None Help needed standing up from a chair using your arms (e.g., wheelchair or bedside chair)?: None Help needed to walk in hospital room?: A Lot Help needed climbing 3-5 steps with a railing? : A Lot 6 Click Score: 20    End of Session   Activity Tolerance: Patient tolerated treatment well Patient left: in bed;with call bell/phone within reach   PT Visit Diagnosis: Difficulty in walking, not elsewhere classified (R26.2)     Time: 2178-3754 PT Time Calculation (min) (ACUTE ONLY): 12 min  Charges:  $Gait Training: 8-22 mins                    Verdene Lennert, PT, DPT  Acute Rehabilitation [phone] pager [phone]) [phone] office

## 2020-07-25 NOTE — Progress Notes (Signed)
   07/25/20 0940  Clinical Encounter Type  Visited With Health care provider  Visit Type Initial  Referral From Nurse  Consult/Referral To Chaplain   Chaplain spoke with unit secretary as Pt's nurse was unavailable. Chaplain passed on that AD is unable to be completed due to Pt's COVID-19 status. Chaplain remains available.  This note was prepared by Chaplain Resident, Dante Gang, MDiv. Chaplain remains available as needed through the on-call pager: [phone].

## 2020-07-25 NOTE — Discharge Summary (Signed)
[name] [name] [id number] DOB: [birth year] DOA: 07/20/2020  PCP: [name] Pounds, NP  Admit date: 07/20/2020  Discharge date: 07/25/2020  Admitted From: HOME   Disposition:  HOME   Recommendations for Outpatient Follow-up:   Follow up with PCP in 1-2 weeks  PCP Please obtain BMP/CBC, 2 view CXR in 1week,  (see Discharge instructions)   PCP Please follow up on the following pending results:     Home Health: None Equipment/Devices: None  Consultations: Neuro. Renal Discharge Condition: Stable    CODE STATUS: Full    Diet Recommendation: Renal diet with 1.5 L/day total fluid restriction    Chief Complaint  Patient presents with  . Seizures     Brief history of present illness from the day of admission and additional interim summary    40 y.o.M with PMH of ESRD on dialysis, seizures, HTN prior CVA and Lupus who presented to the ED after having seizure at dialysis. Pt.was post-ictal requiring bagging on arrival, so was intubated in the ED for airway protection, he incidentally had Covid positive infection as well along with evidence of some fluid overload on chest x-ray.  He was seen by neurology stabilized in the ICU, extubated the next day and transferred to hospitalist service on 07/22/2020 on date 2 of his hospital stay.                                                                 Hospital Course     1. Acute Covid 19 infection - with evidence of pneumonia versus fluid overload on chest x-ray, had mild to moderate lung injury at best was treated with short course of steroid and Remdesivir, has finished his course and completely symptom-free on room air.Marland Kitchen   Recent Labs  Lab 07/20/20 1327 07/20/20 1656 07/20/20 1726 07/21/20 0439 07/21/20 0922 07/22/20 0101 07/23/20 0412 07/24/20 0359  07/24/20 0400 07/25/20 0359  WBC 8.7  --   --  6.4  --  9.1 9.4 9.7  --  8.2  CRP  --  6.2*  --   --   --   --  5.9* 4.2*  --  2.8*  DDIMER  --   --  5.03*  --   --   --  0.86* 0.90*  --  0.69*  BNP  --  1,024.6*  --   --   --   --  508.3*  --  409.4* 999.6*  PROCALCITON  --   --   --   --  71.93 84.92 79.84 55.29  --  36.59  LATICACIDVEN  --  1.1  --   --   --   --   --   --   --   --   AST 35  --   --   --   --  $'25 20 19  'm$ --  18  ALT 22  --   --   --   --  '19 18 19  '$ --  21  ALKPHOS 74  --   --   --   --  62 65 66  --  67  BILITOT 1.0  --   --   --   --  1.2 0.6 0.7  --  0.7  ALBUMIN 3.7  --   --   --   --  3.0* 2.9* 2.9*  --  3.0*  INR 1.0  --   --   --   --   --   --   --   --   --       2.  Breakthrough seizure.  Seen by neurology, CT head and EEG nonacute, currently on Lamictal, dose Lamictal adjusted per commendations from neurology, now seizure-free, written instructions provided not to drive till cleared by neurology, outpatient neurology follow-up and PCP follow-up post discharge.  3.  ESRD.  Renal following, he is on a modified dialysis schedule due to staffing constraints.  Will be discharged today after dialysis on 07/25/2020.  4.  HX of lupus, hypercoagulable state with prior CVA.  On Eliquis.  5.  Extremely elevated procalcitonin levels.  No leukocytosis or fever, is due to ESRD status, final blood cultures negative, nontoxic appearance, afebrile without leukocytosis   Discharge diagnosis     Active Problems:   Seizure [facility])    Discharge instructions    Discharge Instructions    Ambulatory referral to Neurology   Complete by: As directed    An appointment is requested in approximately 1-2 weeks, Lamotrigine dose increased following seizure with HD, second seizure in ER requiring intubation. Added 50 mg chewable to take on HD days, increase LTG ER dose to 400 mg daily at discharge. Does not have adequate follow up with neurology outpatient.   Discharge  instructions   Complete by: As directed    Do not drive until you have been cleared by your neurologist to do so.    Follow with Primary MD [name] Pounds, NP in 7 days   Get CBC, CMP, 2 view Chest X ray -  checked next visit within 1 week by Primary MD    Activity: As tolerated with Full fall precautions use walker/cane & assistance as needed  Disposition Home    Diet: Renal diet with 1.5 L/day total fluid restriction.   Special Instructions: If you have smoked or chewed Tobacco  in the last 2 yrs please stop smoking, stop any regular Alcohol  and or any Recreational drug use.  On your next visit with your primary care physician please Get Medicines reviewed and adjusted.  Please request your Prim.MD to go over all Hospital Tests and Procedure/Radiological results at the follow up, please get all Hospital records sent to your Prim MD by signing hospital release before you go home.  If you experience worsening of your admission symptoms, develop shortness of breath, life threatening emergency, suicidal or homicidal thoughts you must seek medical attention immediately by calling 911 or calling your MD immediately  if symptoms less severe.  You Must read complete instructions/literature along with all the possible adverse reactions/side effects for all the Medicines you take and that have been prescribed to you. Take any new Medicines after you have completely understood and accpet all the possible adverse reactions/side effects.   Increase activity slowly   Complete by: As directed  Discharge Medications   Allergies as of 07/25/2020      Reactions   Zyrtec [cetirizine] Swelling   FACIAL SWELLING, NOT CATEGORIZED   Zyrtec [cetirizine]       Medication List    STOP taking these medications   hydrochlorothiazide 25 MG tablet Commonly known as: HYDRODIURIL   LamoTRIgine 300 MG Tb24 24 hour tablet Replaced by: lamoTRIgine 25 MG tablet You also have another medication with  the same name that you need to continue taking as instructed.     TAKE these medications   amLODipine 10 MG tablet Commonly known as: NORVASC Take 1 tablet (10 mg total) by mouth daily.   apixaban 2.5 MG Tabs tablet Commonly known as: Eliquis Take 1 tablet (2.5 mg total) by mouth 2 (two) times daily. TAKE 1 TABLET (2.5 MG TOTAL) BY MOUTH 2 (TWO) TIMES DAILY.   atorvastatin 40 MG tablet Commonly known as: LIPITOR Take 1 tablet (40 mg total) by mouth daily at 6 PM.   calcitRIOL 0.25 MCG capsule Commonly known as: ROCALTROL Take 1 capsule (0.25 mcg total) by mouth daily.   calcium acetate 667 MG capsule Commonly known as: PHOSLO Take 1 capsule (667 mg total) by mouth See admin instructions. Take 667 mg with each meal and each snack What changed:   when to take this  additional instructions   carvedilol 6.25 MG tablet Commonly known as: COREG Take 1 tablet (6.25 mg total) by mouth 2 (two) times daily with a meal. What changed:   medication strength  how much to take   lamoTRIgine 200 MG tablet Commonly known as: LAMICTAL Take 1 tablet (200 mg total) by mouth 2 (two) times daily. What changed:   medication strength  how much to take  Another medication with the same name was removed. Continue taking this medication, and follow the directions you see here.   lamoTRIgine 25 MG tablet Commonly known as: LAMICTAL Take 2 tablets (50 mg total) by mouth every Monday, Wednesday, and Friday. Take before you are leaving for dialysis. What changed: You were already taking a medication with the same name, and this prescription was added. Make sure you understand how and when to take each. Replaces: LamoTRIgine 300 MG Tb24 24 hour tablet   oxyCODONE-acetaminophen 5-325 MG tablet Commonly known as: Percocet Take 1 tablet by mouth every 6 (six) hours as needed for severe pain.   silver sulfADIAZINE 1 % cream Commonly known as: Silvadene Apply to affected skin areas once per  day for 10 days or until area is healed        [facility] Call.   Why: You will need to dialyze in isolation for at least 14 days from positive test on a TTS 3rd shift schedule at [facility]. Please call as soon as you are discharged for more information.  Contact information: [address] [place] [address] [phone]        [name] Pounds, NP. Schedule an appointment as soon as possible for a visit in 1 week(s).   Specialty: Nurse Practitioner Contact information: [address] [place] [address] [phone]               Major procedures and Radiology Reports - PLEASE review detailed and final reports thoroughly  -       EEG  Result Date: 07/20/2020 Lora Havens, MD     07/20/2020  6:35 PM Patient Name: [name] [name] MRN: [id number] Epilepsy  Attending: Lora Havens Referring Physician/Provider: Dr Kipp Brood Date: 07/19/2020 Duration: 24.55 mins Patient history: 40yo  M wit seizure like episode. EEG to evaluate for seizure Level of alertness:  Comatose AEDs during EEG study: LTG, LEV, Propofol Technical aspects: This EEG study was done with scalp electrodes positioned according to the 10-20 International system of electrode placement. Electrical activity was acquired at a sampling rate of '500Hz'$  and reviewed with a high frequency filter of '70Hz'$  and a low frequency filter of '1Hz'$ . EEG data were recorded continuously and digitally stored. Description: EEG showed continuous generalized polymorphic mixed frequencies  With predominantly 5 to 6 Hz theta slowing admixed with intermittent generalized 2-'3Hz'$  delta slowing at times with triphasic morphology.  Intermittent 15-'18hz'$  generalized beta activity was also noted. Hyperventilation and photic stimulation were not performed.   ABNORMALITY -Continuous slow, generalized IMPRESSION: This study is suggestive of severe diffuse encephalopathy, nonspecific etiology. No seizures or  epileptiform discharges were seen throughout the recording. Dr Lynetta Mare was notified. Lora Havens   CT HEAD WO CONTRAST  Result Date: 07/20/2020 CLINICAL DATA:  Seizure. EXAM: CT HEAD WITHOUT CONTRAST TECHNIQUE: Contiguous axial images were obtained from the base of the skull through the vertex without intravenous contrast. COMPARISON:  04/29/2019 FINDINGS: Brain: There is no evidence for acute hemorrhage, hydrocephalus, mass lesion, or abnormal extra-axial fluid collection. No definite CT evidence for acute infarction. Diffuse loss of parenchymal volume is consistent with atrophy. Similar appearance of left parietal encephalomalacia with encephalomalacia also noted in the right frontal parietal region and right parietooccipital region. Lacunar infarcts noted left basal ganglia. Vascular: No hyperdense vessel or unexpected calcification. Skull: Normal. Negative for fracture or focal lesion. Sinuses/Orbits: Mild chronic mucosal thickening left maxillary sinus. Remaining visualized paranasal sinuses and mastoid air cells are clear. Other: None. IMPRESSION: 1. Stable.  No acute intracranial abnormality. 2. Atrophy with chronic small vessel disease and stable bilateral areas of supratentorial encephalomalacia suggesting old infarcts. Electronically Signed   By: Misty Stanley M.D.   On: 07/20/2020 14:50   DG Chest Port 1 View  Result Date: 07/23/2020 CLINICAL DATA:  Shortness of breath.  COVID positive. EXAM: PORTABLE CHEST 1 VIEW COMPARISON:  One-view chest x-ray 07/20/2020 FINDINGS: The patient has been extubated. Heart is mildly enlarged. Lung volumes remain low. Aeration of both lungs is markedly improved. Minimal left basilar airspace opacity remains. Minimal interstitial changes remain in the right lung. IMPRESSION: Marked improvement in aeration of both lungs with minimal left basilar airspace disease. Electronically Signed   By: San Morelle M.D.   On: 07/23/2020 08:44   DG Chest Portable 1  View  Result Date: 07/20/2020 CLINICAL DATA:  Intubation. EXAM: PORTABLE CHEST 1 VIEW COMPARISON:  09/20/2018. FINDINGS: Endotracheal tube and NG tube in good anatomic position. Cardiomegaly. Diffuse bilateral pulmonary infiltrates/edema noted on today's exam. No pleural effusion or pneumothorax. Left subclavian stent noted. IMPRESSION: 1. Lines and tubes in good anatomic position. 2. Cardiomegaly. Diffuse bilateral pulmonary infiltrates/edema noted on today's exam. Electronically Signed   By: Marcello Moores  Register   On: 07/20/2020 13:18    Micro Results     Recent Results (from the past 240 hour(s))  Blood culture (routine x 2)     Status: None (Preliminary result)   Collection Time: 07/20/20  1:02 PM   Specimen: BLOOD RIGHT FOREARM  Result Value Ref Range Status   Specimen Description BLOOD RIGHT FOREARM  Final   Special Requests   Final    BOTTLES DRAWN AEROBIC AND ANAEROBIC Blood  Culture adequate volume   Culture   Final    NO GROWTH 4 DAYS Performed at [facility], [place] [address], [place], [place] [address]    Report Status PENDING  Incomplete  Blood culture (routine x 2)     Status: None (Preliminary result)   Collection Time: 07/20/20  4:56 PM   Specimen: BLOOD RIGHT FOREARM  Result Value Ref Range Status   Specimen Description BLOOD RIGHT FOREARM  Final   Special Requests   Final    BOTTLES DRAWN AEROBIC AND ANAEROBIC Blood Culture adequate volume   Culture   Final    NO GROWTH 4 DAYS Performed at [facility], [place] [address] [place], [place] [address]    Report Status PENDING  Incomplete  MRSA PCR Screening     Status: None   Collection Time: 07/20/20 11:22 PM   Specimen: Nasal Mucosa; Nasopharyngeal  Result Value Ref Range Status   MRSA by PCR NEGATIVE NEGATIVE Final    Comment:        The GeneXpert MRSA Assay (FDA approved for NASAL specimens only), is one component of a comprehensive MRSA colonization surveillance program. It is not intended to diagnose  MRSA infection nor to guide or monitor treatment for MRSA infections. Performed at [facility], [place] [address], [place], [place] [address]     Today   Subjective    Stonie Cumberbatch today has no headache,no chest abdominal pain,no new weakness tingling or numbness, feels much better wants to go home today.    Objective   Blood pressure (!) 146/97, pulse 65, temperature 98.6 F (37 C), temperature source Oral, resp. rate 11, height '6\' 2"'$  (1.88 m), weight 113 kg, SpO2 98 %.   Intake/Output Summary (Last 24 hours) at 07/25/2020 0923 Last data filed at 07/24/2020 1814 Gross per 24 hour  Intake 360 ml  Output --  Net 360 ml    Exam  Awake Alert, No new F.N deficits, Normal affect [place].AT,PERRAL Supple Neck,No JVD, No cervical lymphadenopathy appriciated.  Symmetrical Chest wall movement, Good air movement bilaterally, CTAB RRR,No Gallops,Rubs or new Murmurs, No Parasternal Heave +ve B.Sounds, Abd Soft, Non tender, No organomegaly appriciated, No rebound -guarding or rigidity. No Cyanosis, Clubbing or edema, No new Rash or bruise   Data Review   CBC w Diff:  Lab Results  Component Value Date   WBC 8.2 07/25/2020   HGB 11.2 (L) 07/25/2020   HGB 11.6 (L) 12/12/2017   HGB 15.5 10/14/2013   HCT 33.4 (L) 07/25/2020   HCT 34.7 (L) 12/12/2017   HCT 45.6 10/14/2013   PLT 221 07/25/2020   PLT 134 (L) 12/12/2017   LYMPHOPCT 11 07/25/2020   LYMPHOPCT 21.3 10/14/2013   MONOPCT 5 07/25/2020   MONOPCT 7.2 10/14/2013   EOSPCT 0 07/25/2020   EOSPCT 2.5 10/14/2013   BASOPCT 0 07/25/2020   BASOPCT 0.3 10/14/2013    CMP:  Lab Results  Component Value Date   NA 133 (L) 07/25/2020   NA 143 12/12/2017   K 4.7 07/25/2020   CL 94 (L) 07/25/2020   CO2 19 (L) 07/25/2020   BUN 126 (H) 07/25/2020   BUN 36 (H) 12/12/2017   CREATININE 19.60 (H) 07/25/2020   CREATININE 2.06 (H) 10/24/2015   PROT 6.8 07/25/2020   PROT 6.7 12/12/2017   ALBUMIN 3.0 (L) 07/25/2020   ALBUMIN 3.7  12/12/2017   BILITOT 0.7 07/25/2020   BILITOT <0.2 12/12/2017   ALKPHOS 67 07/25/2020  AST 18 07/25/2020   ALT 21 07/25/2020  .   Total Time in preparing paper work, data evaluation and todays exam - 59 minutes  Lala Lund M.D on 07/25/2020 at 9:23 AM  [facility]

## 2020-07-25 NOTE — Progress Notes (Signed)
[facility] Progress Note  Subjective: seen in room, no c/o's today.   Vitals:   07/24/20 1108 07/24/20 1246 07/24/20 2000 07/25/20 0413  BP: (!) 180/120 (!) 159/99 (!) 176/114 (!) 146/97  Pulse:  67 63 65  Resp: _0 Temp:  97.9 F (36.6 C) 98.3 F (36.8 C) 98.6 F (37 C)  TempSrc:  Oral Oral Oral  SpO2:  94% 95% 98%  Weight:    113 kg  Height:        Exam:   alert, nad   no jvd  Chest cta bilat  Cor reg no RG  Abd soft ntnd no ascites   Ext no LE edema   Alert, NF, ox3   LUA aVF+ bruit       OP HD: [facility] MWF   3h  450/500  111.5kg  2/2 bath  15ga  Hep 3700  LUA AVF  - mircera 150 q2 last 2/2  - vneofer 50/wk  - hect 3 ug tiw  Assessment/ Plan: 1. Breakthrough seizure:Suspected to be due to fever. Neurology on board. On lamictal.  2. Acute Resp failure: on vent 24 hrs, down to RA now. Cause pulm edema and prob COVID pna. CXR 2/5 much better, residual basilar opacity.  3. COVID 19: Febrile, positive for covid on arrival. Started on steroids and isolation precautions. Fevers resolved. CRP and BNP improved, on room air. Per pmd 4. ESRD - cont HD MWF. HD today. Dialysis treatment will be shortened due to high census/staffing shortage during COVID surge. 5. Hypertension/volume:CXR with diffuse infiltrates vs. Pulmonary edema. UF 3L on 2/4. 2kg over today, get vol down as tolerates.  6. Anemia:Hgb 9.4. ESA just given yesterday. Hold IV iron in setting of acute infection. 7. Metabolic bone disease:Calcium 8.0 -> 6.9 today. Has been in the 8's outpatient. Continue hectorol, use high Ca bath and follow. Phos at goal. 8. Disposition- hopeful discharge soon, per primary  Rob Rayquon Uselman 07/25/2020, 9:54 AM   Recent Labs  Lab 07/20/20 1327 07/20/20 1352 07/24/20 0359 07/25/20 0359  K 3.8   < > 5.2* 4.7  BUN 41*   < > 104* 126*  CREATININE 12.63*   < > 17.20* 19.60*  CALCIUM 8.0*   < > 8.6* 9.2  PHOS 4.6  --   --   --   HGB 10.8*   < >  10.5* 11.2*   < > = values in this interval not displayed.   Inpatient medications: . amLODipine  10 mg Oral Daily  . apixaban  2.5 mg Oral BID  . carvedilol  6.25 mg Oral BID WC  . chlorhexidine gluconate (MEDLINE KIT)  15 mL Mouth Rinse BID  . Chlorhexidine Gluconate Cloth  6 each Topical Daily  . doxercalciferol  3 mcg Intravenous Q M,W,F-HD  . lamoTRIgine  200 mg Oral BID  . lamoTRIgine  50 mg Oral Q M,W,F  . mouth rinse  15 mL Mouth Rinse BID  . methylPREDNISolone (SOLU-MEDROL) injection  40 mg Intravenous Q12H  . pantoprazole  40 mg Oral QHS   . remdesivir 100 mg in NS 100 mL Stopped (07/24/20 0943)   docusate, hydrALAZINE, polyethylene glycol

## 2020-07-25 NOTE — Progress Notes (Signed)
Renal Navigator spoke with patient regarding outpatient COVID shift at discharge: TTS 4:45pm at [address] [facility]. He needs to arrive to the back door of the building and call/wait in his car until he is waved in. This will start tomorrow at discharge and patient should be able to return to his normal shift on 2/18. He said he would not remember this, and asked that this information be texted to his cell phone. Navigator agreed. Navigator updated clinic.   Alphonzo Cruise, [place] Renal Navigator [phone]

## 2020-07-25 NOTE — Discharge Instructions (Signed)
Do not drive until you have been cleared by your neurologist to do so.    Follow with Primary MD Gildardo Pounds, NP in 7 days   Get CBC, CMP, 2 view Chest X ray -  checked next visit within 1 week by Primary MD    Activity: As tolerated with Full fall precautions use walker/cane & assistance as needed  Disposition Home    Diet: Renal diet with 1.5 L/day total fluid restriction.   Special Instructions: If you have smoked or chewed Tobacco  in the last 2 yrs please stop smoking, stop any regular Alcohol  and or any Recreational drug use.  On your next visit with your primary care physician please Get Medicines reviewed and adjusted.  Please request your Prim.MD to go over all Hospital Tests and Procedure/Radiological results at the follow up, please get all Hospital records sent to your Prim MD by signing hospital release before you go home.  If you experience worsening of your admission symptoms, develop shortness of breath, life threatening emergency, suicidal or homicidal thoughts you must seek medical attention immediately by calling 911 or calling your MD immediately  if symptoms less severe.  You Must read complete instructions/literature along with all the possible adverse reactions/side effects for all the Medicines you take and that have been prescribed to you. Take any new Medicines after you have completely understood and accpet all the possible adverse reactions/side effects.       Person Under Monitoring Name: [name] [name]  Location: [place] [address]   Infection Prevention Recommendations for Individuals Confirmed to have, or Being Evaluated for, 2019 Novel Coronavirus (COVID-19) Infection Who Receive Care at Home  Individuals who are confirmed to have, or are being evaluated for, COVID-19 should follow the prevention steps below until a healthcare provider or local or state health department says they can return to normal activities.  Stay  home except to get medical care You should restrict activities outside your home, except for getting medical care. Do not go to work, school, or public areas, and do not use public transportation or taxis.  Call ahead before visiting your doctor Before your medical appointment, call the healthcare provider and tell them that you have, or are being evaluated for, COVID-19 infection. This will help the healthcare providers office take steps to keep other people from getting infected. Ask your healthcare provider to call the local or state health department.  Monitor your symptoms Seek prompt medical attention if your illness is worsening (e.g., difficulty breathing). Before going to your medical appointment, call the healthcare provider and tell them that you have, or are being evaluated for, COVID-19 infection. Ask your healthcare provider to call the local or state health department.  Wear a facemask You should wear a facemask that covers your nose and mouth when you are in the same room with other people and when you visit a healthcare provider. People who live with or visit you should also wear a facemask while they are in the same room with you.  Separate yourself from other people in your home As much as possible, you should stay in a different room from other people in your home. Also, you should use a separate bathroom, if available.  Avoid sharing household items You should not share dishes, drinking glasses, cups, eating utensils, towels, bedding, or other items with other people in your home. After using these items, you should wash them thoroughly with soap and water.  Cover your coughs and sneezes Cover your mouth and nose with a tissue when you cough or sneeze, or you can cough or sneeze into your sleeve. Throw used tissues in a lined trash can, and immediately wash your hands with soap and water for at least 20 seconds or use an alcohol-based hand rub.  Wash your  [facility] your hands often and thoroughly with soap and water for at least 20 seconds. You can use an alcohol-based hand sanitizer if soap and water are not available and if your hands are not visibly dirty. Avoid touching your eyes, nose, and mouth with unwashed hands.   Prevention Steps for Caregivers and Household Members of Individuals Confirmed to have, or Being Evaluated for, COVID-19 Infection Being Cared for in the Home  If you live with, or provide care at home for, a person confirmed to have, or being evaluated for, COVID-19 infection please follow these guidelines to prevent infection:  Follow healthcare providers instructions Make sure that you understand and can help the patient follow any healthcare provider instructions for all care.  Provide for the patients basic needs You should help the patient with basic needs in the home and provide support for getting groceries, prescriptions, and other personal needs.  Monitor the patients symptoms If they are getting sicker, call his or her medical provider and tell them that the patient has, or is being evaluated for, COVID-19 infection. This will help the healthcare providers office take steps to keep other people from getting infected. Ask the healthcare provider to call the local or state health department.  Limit the number of people who have contact with the patient  If possible, have only one caregiver for the patient.  Other household members should stay in another home or place of residence. If this is not possible, they should stay  in another room, or be separated from the patient as much as possible. Use a separate bathroom, if available.  Restrict visitors who do not have an essential need to be in the home.  Keep older adults, very young children, and other sick people away from the patient Keep older adults, very young children, and those who have compromised immune systems or chronic health conditions  away from the patient. This includes people with chronic heart, lung, or kidney conditions, diabetes, and cancer.  Ensure good ventilation Make sure that shared spaces in the home have good air flow, such as from an air conditioner or an opened window, weather permitting.  Wash your hands often  Wash your hands often and thoroughly with soap and water for at least 20 seconds. You can use an alcohol based hand sanitizer if soap and water are not available and if your hands are not visibly dirty.  Avoid touching your eyes, nose, and mouth with unwashed hands.  Use disposable paper towels to dry your hands. If not available, use dedicated cloth towels and replace them when they become wet.  Wear a facemask and gloves  Wear a disposable facemask at all times in the room and gloves when you touch or have contact with the patients blood, body fluids, and/or secretions or excretions, such as sweat, saliva, sputum, nasal mucus, vomit, urine, or feces.  Ensure the mask fits over your nose and mouth tightly, and do not touch it during use.  Throw out disposable facemasks and gloves after using them. Do not reuse.  Wash your hands immediately after removing your facemask and gloves.  If your personal clothing becomes  contaminated, carefully remove clothing and launder. Wash your hands after handling contaminated clothing.  Place all used disposable facemasks, gloves, and other waste in a lined container before disposing them with other household waste.  Remove gloves and wash your hands immediately after handling these items.  Do not share dishes, glasses, or other household items with the patient  Avoid sharing household items. You should not share dishes, drinking glasses, cups, eating utensils, towels, bedding, or other items with a patient who is confirmed to have, or being evaluated for, COVID-19 infection.  After the person uses these items, you should wash them thoroughly with soap and  water.  Wash laundry thoroughly  Immediately remove and wash clothes or bedding that have blood, body fluids, and/or secretions or excretions, such as sweat, saliva, sputum, nasal mucus, vomit, urine, or feces, on them.  Wear gloves when handling laundry from the patient.  Read and follow directions on labels of laundry or clothing items and detergent. In general, wash and dry with the warmest temperatures recommended on the label.  Clean all areas the individual has used often  Clean all touchable surfaces, such as counters, tabletops, doorknobs, bathroom fixtures, toilets, phones, keyboards, tablets, and bedside tables, every day. Also, clean any surfaces that may have blood, body fluids, and/or secretions or excretions on them.  Wear gloves when cleaning surfaces the patient has come in contact with.  Use a diluted bleach solution (e.g., dilute bleach with 1 part bleach and 10 parts water) or a household disinfectant with a label that says EPA-registered for coronaviruses. To make a bleach solution at home, add 1 tablespoon of bleach to 1 quart (4 cups) of water. For a larger supply, add  cup of bleach to 1 gallon (16 cups) of water.  Read labels of cleaning products and follow recommendations provided on product labels. Labels contain instructions for safe and effective use of the cleaning product including precautions you should take when applying the product, such as wearing gloves or eye protection and making sure you have good ventilation during use of the product.  Remove gloves and wash hands immediately after cleaning.  Monitor yourself for signs and symptoms of illness Caregivers and household members are considered close contacts, should monitor their health, and will be asked to limit movement outside of the home to the extent possible. Follow the monitoring steps for close contacts listed on the symptom monitoring form.   ? If you have additional questions, contact your  local health department or call the epidemiologist on call at [phone] (available 24/7). ? This guidance is subject to change. For the most up-to-date guidance from [facility], please refer to their website: [email or web address]

## 2020-07-26 ENCOUNTER — Telehealth: Payer: Self-pay | Admitting: Nurse Practitioner

## 2020-07-26 DIAGNOSIS — U071 COVID-19: Secondary | ICD-10-CM | POA: Insufficient documentation

## 2020-07-26 NOTE — Telephone Encounter (Signed)
Transition of care contact from inpatient facility  Date of discharge: 07/25/20 Date of contact: 07/26/20 Method: Phone Spoke to: Patient  Patient contacted to discuss transition of care from recent inpatient hospitalization. Patient was admitted to [facility] from 02/02-02/12/2020 with discharge diagnosis of Covid 19, breakthrough seizures.   Medication changes were reviewed.  Patient will follow up with his/her outpatient HD unit on: 07/28/2020 3rd shift [facility].

## 2020-07-28 ENCOUNTER — Ambulatory Visit: Payer: No Typology Code available for payment source | Admitting: Neurology

## 2020-08-03 NOTE — Progress Notes (Signed)
[facility]    Provider:  Dr Jaynee Eagles Requesting Provider: Rikki Spearing, NP Primary Care Provider:  Gildardo Pounds, NP  CC:  Seizure  Interval history: patient had a breakthrough seizure in the setting of PNA and Covid. 40 y.o. male here as requested by Gildardo Pounds, NP for seizures, strokes now on chronic anticoagulation for anticoagulation positive, breakthrough seizure in the setting of noncompliance with his Lamictal. PMHx  hypertension, lupus, seizures, stroke (51 for 41 years of age), hypertension, chronic anticoagulation, lupus anticoagulant positive, obesity, tobacco dependence, end-stage renal disease on dialysis, prediabetes.  Breakthrough in the setting of PNA and Covid His Lamictal was adjusted in the hospital.   HPI:  [name] [name] is a 40 y.o. male here as requested by Gildardo Pounds, NP for seizures, strokes now on chronic anticoagulation for anticoagulation positive, breakthrough seizure in the setting of noncompliance with his Lamictal. PMHx  hypertension, lupus, seizures, stroke (64 for 40 years of age), hypertension, chronic anticoagulation, lupus anticoagulant positive, obesity, tobacco dependence, end-stage renal disease on dialysis, prediabetes.  I reviewed emergency notes from November 2020: Patient had a witnessed seizure lasting 3-5 minutes while at dialysis, he has a hx of seizures and take lamictal.  There is no seizure activity on arrival but patient did seize in the emergency room, generalized tonic-clonic, no initial focality, generalized shaking, confusion and somnolence, moderate severity, in the context of medical noncompliance, no change in medication no recent head injuries.  At that time he reported his last seizure was 1 month prior, he says he has been taking his antiseizure medication without change(later admitted he missed doses).  Patient had a witnessed seizure in the emergency room and after speaking with neurology he was  loaded with Keppra, CT of the head was negative, patient later admitted he had missed a few days of his Lamictal.  He is here alone. The first time he had a seizure was 8 years ago, his roommate heard him choking and witnessed a seizure. If he misses his medication he has a breakthrough seizure, but if taking every day is well controlled. Last seizures was in November 04/29/2019. We discussed driving restrictions. He reports he had a workup including MRI brain and eeg, its been many years. He does know the seizures are coming. No known family history. No significant history of head trauma. Never had seizures as a child. Described as generalized shaking, tongue biting, he has urinated and defecated. No status epilepticus, never been intubated. Last a minute. He endorses compliance with medication. 15 years ago had a stroke, he was in the hospital for a month, due to drug use and cocaine use. No other focal neurologic deficits, associated symptoms, inciting events or modifiable factors.  Reviewed notes, labs and imaging from outside physicians, which showed:  CT head 04/29/2019: Brain: No acute hemorrhage. Left frontoparietal and right parietal encephalomalacia consistent with prior infarcts, unchanged. Remote lacunar infarct in the left caudate, unchanged. Generalized atrophy and chronic small vessel ischemia but is age advanced, but stable from prior exam. No midline shift or mass effect. No subdural or extra-axial collection. (personally reviewed images (and with patient) and agree with the above)  Vascular: No hyperdense vessel.  Skull: No fracture or focal lesion.  Sinuses/Orbits: Chronic mucosal thickening of left maxillary sinus. Mild right maxillary sinus mucosal thickening is new. No sinus fluid level. Mastoid air cells are clear.  Other: None.  IMPRESSION: 1. No acute intracranial abnormality. 2. Multiple remote infarcts unchanged from  prior. Unchanged but age advanced atrophy and  chronic small vessel ischemia.   lamictal level 4.9  Review of Systems: Patient complains of symptoms per HPI as well as the following symptoms: strokes. Pertinent negatives and positives per HPI. All others negative.   Social History   Socioeconomic History  . Marital status: Single    Spouse name: Not on file  . Number of children: 0  . Years of education: Not on file  . Highest education level: High school graduate  Occupational History  . Not on file  Tobacco Use  . Smoking status: Former Smoker    Packs/day: 0.25    Types: Cigarettes    Quit date: 12/21/2017    Years since quitting: 2.6  . Smokeless tobacco: Never Used  Vaping Use  . Vaping Use: Never used  Substance and Sexual Activity  . Alcohol use: Yes    Comment: "once in awhile"  . Drug use: No  . Sexual activity: Not Currently  Other Topics Concern  . Not on file  Social History Narrative   Lives with a friend   Right handed   Caffeine: rare   Social Determinants of Health   Financial Resource Strain: Not on file  Food Insecurity: Not on file  Transportation Needs: Not on file  Physical Activity: Not on file  Stress: Not on file  Social Connections: Not on file  Intimate Partner Violence: Not on file    Family History  Problem Relation Age of Onset  . Diabetes Mother   . Diabetes Maternal Grandmother   . Seizures Neg Hx        not on mother's side. he doesn't know about his father's side    Past Medical History:  Diagnosis Date  . Chronic kidney disease   . ESRD (end stage renal disease) ([place])   . Hypertension   . Lupus ([place])   . Seizures ([place])   . Seizures ([place])   . Stroke [facility])    29 or 40 years of age.    . Wears glasses     Patient Active Problem List   Diagnosis Date Noted  . Long-term current use of anticonvulsant 08/04/2020  . Breakthrough seizure ([place]) 07/20/2020  . Acute respiratory failure with hypoxia ([place])   . Lupus ([place])   . Seizure, late effect of stroke ([place])  08/26/2019  . Acute renal failure superimposed on chronic kidney disease ([place]) 09/20/2018  . ESRD (end stage renal disease) ([place]) 03/18/2018  . Prediabetes 01/10/2018  . ARF (acute renal failure) ([place]) 12/21/2017  . Chest pain in adult   . CKD (chronic kidney disease) stage 3, GFR 30-59 ml/min ([facility]) 10/24/2015  . Seizures ([place]) 10/24/2015  . HTN (hypertension) 01/12/2015  . Chronic anticoagulation 04/24/2011  . Lupus anticoagulant positive 04/24/2011  . OBESITY, NOS 08/15/2006  . THROMBOCYTOPENIA 08/15/2006  . TOBACCO DEPENDENCE 08/15/2006  . CVA 08/15/2006    Past Surgical History:  Procedure Laterality Date  . AV FISTULA PLACEMENT Left 12/30/2017   Procedure: ARTERIOVENOUS (AV) FISTULA CREATION VERSUS INSERTION OF ARTERIOVENOUS GRAFT LEFT ARM;  Surgeon: Rosetta Posner, MD;  Location: [place];  Service: Vascular;  Laterality: Left;  . BASCILIC VEIN TRANSPOSITION Left 08/15/2018   Procedure: LEFT FIRST STAGE BASCILIC VEIN TRANSPOSITION;  Surgeon: Rosetta Posner, MD;  Location: [place];  Service: Vascular;  Laterality: Left;  . BASCILIC VEIN TRANSPOSITION Left 10/30/2018   Procedure: INSERTION OF GORE-TEX GRAFT LEFT UPPER ARM;  Surgeon: Waynetta Sandy, MD;  Location:  [facility];  Service: Vascular;  Laterality: Left;  . INSERTION OF DIALYSIS CATHETER N/A 09/20/2018   Procedure: INSERTION OF TUNNELED DIALYSIS CATHETER RIGHT INTERNAL JUGULAR;  Surgeon: Marty Heck, MD;  Location: [place];  Service: Vascular;  Laterality: N/A;  . RENAL BIOPSY      Current Outpatient Medications  Medication Sig Dispense Refill  . amLODipine (NORVASC) 10 MG tablet Take 1 tablet (10 mg total) by mouth daily. 90 tablet 3  . carvedilol (COREG) 6.25 MG tablet Take 1 tablet (6.25 mg total) by mouth 2 (two) times daily with a meal. 60 tablet 0  . Ferric Citrate (AURYXIA PO) Take 4 tablets by mouth 3 times a day with meals and 2 with snacks. Each tablet is 210 mg.    . [START ON 08/05/2020] lamoTRIgine  (LAMICTAL) 100 MG tablet Take 1 tablet (100 mg total) by mouth every Monday, Wednesday, and Friday. Take after dialysis. 90 tablet 6  . lamoTRIgine (LAMICTAL) 200 MG tablet Take 1 tablet (200 mg total) by mouth 2 (two) times daily. 180 tablet 4  . silver sulfADIAZINE (SILVADENE) 1 % cream Apply to affected skin areas once per day for 10 days or until area is healed (Patient not taking: No sig reported) 50 g 1   No current facility-administered medications for this visit.    Allergies as of 08/04/2020 - Review Complete 08/04/2020  Allergen Reaction Noted  . Zyrtec [cetirizine] Swelling 06/29/2017  . Zyrtec [cetirizine]  01/14/2020    Vitals: BP (!) 200/120 (BP Location: Right Arm, Patient Position: Sitting) Comment: pt hasn't taken medication yet; took while here in office.  Pulse 84   Ht 6' (1.829 m)   Wt 260 lb (117.9 kg)   BMI 35.26 kg/m  Last Weight:  Wt Readings from Last 1 Encounters:  08/04/20 260 lb (117.9 kg)   Last Height:   Ht Readings from Last 1 Encounters:  08/04/20 6' (1.829 m)     Physical exam: Exam: Gen: NAD, conversant, well nourised,  well groomed                     CV: RRR, no MRG. No Carotid Bruits. No peripheral edema, warm, nontender Eyes: Conjunctivae clear without exudates or hemorrhage  Neuro: Detailed Neurologic Exam  Speech:    Speech is normal; fluent and spontaneous with normal comprehension.  Cognition:    The patient is oriented to person, place, and time;     recent and remote memory intact;     language fluent;     normal attention, concentration,     fund of knowledge Cranial Nerves:    The pupils are equal, round, and reactive to light. Could not visualize fundi. Proptosis of the right eye with nystagmus, left nasolabial flattening,   Visual fields are full to finger confrontation. Extraocular movements are intact. Trigeminal sensation is intact and the muscles of mastication are normal. The palate elevates in the midline. Hearing  intact. Voice is normal. Shoulder shrug is normal. The tongue has normal motion without fasciculations.   Coordination:    Normal finger to nose and heel to shin.   Gait:  normal native gait  Motor Observation:    No asymmetry, no atrophy, and no involuntary movements noted. Tone:    Normal muscle tone.    Posture:    Posture is normal. normal erect    Strength:    Strength is V/V in the upper and lower limbs.      Sensation:  intact to LT     Reflex Exam:  DTR's:    Deep tendon reflexes in the upper and lower extremities are symmetrical bilaterally.   Toes:    The toes are equivalent bilaterally.   Clonus:    Clonus is absent.    Assessment/Plan:  40 y.o. male here as requested by Gildardo Pounds, NP for seizures, strokes now on chronic anticoagulation, breakthrough seizure in the setting of noncompliance with his Lamictal. PMHx  hypertension, lupus, seizures, stroke (26 for 40 years of age), hypertension, chronic anticoagulation, lupus anticoagulant positive, obesity, tobacco dependence, end-stage renal disease on dialysis, prediabetes. Seizures likely secondary to encephalomalacia, remote strokes in the setting of cocaine use and lupus anticoagulant positive.  Breakthrough in the setting of PNA and Covid His Lamictal was adjusted in the hospital. Take Lamotrigine '200mg'$  in the morning and '200mg'$  in the evening Take 1x'100mg'$  Lamotrigine tab AFTER dialysis M-W-F Come back in 4 weeks for repeat Lamictal Level  Meds ordered this encounter  Medications  . lamoTRIgine (LAMICTAL) 100 MG tablet    Sig: Take 1 tablet (100 mg total) by mouth every Monday, Wednesday, and Friday. Take after dialysis.    Dispense:  90 tablet    Refill:  6  . lamoTRIgine (LAMICTAL) 200 MG tablet    Sig: Take 1 tablet (200 mg total) by mouth 2 (two) times daily.    Dispense:  180 tablet    Refill:  4   Orders Placed This Encounter  Procedures  . Lamotrigine level  . Lamotrigine level     PRIOR  Discussed: Discussed Patients with epilepsy have a small risk of sudden unexpected death, a condition referred to as sudden unexpected death in epilepsy (SUDEP). SUDEP is defined specifically as the sudden, unexpected, witnessed or unwitnessed, nontraumatic and nondrowning death in patients with epilepsy with or without evidence for a seizure, and excluding documented status epilepticus, in which post mortem examination does not reveal a structural or toxicologic cause for death  Discussed: Per [facility] statutes, patients with seizures are not allowed to drive until they have been seizure-free for six months.    Use caution when using heavy equipment or power tools. Avoid working on ladders or at heights. Take showers instead of baths. Ensure the water temperature is not too high on the home water heater. Do not go swimming alone. Do not lock yourself in a room alone (i.e. bathroom). When caring for infants or small children, sit down when holding, feeding, or changing them to minimize risk of injury to the child in the event you have a seizure. Maintain good sleep hygiene. Avoid alcohol.    If patient has another seizure, call 911 and bring them back to the ED if: A.  The seizure lasts longer than 5 minutes.      B.  The patient doesn't wake shortly after the seizure or has new problems such as difficulty seeing, speaking or moving following the seizure C.  The patient was injured during the seizure D.  The patient has a temperature over 102 F (39C) E.  The patient vomited during the seizure and now is having trouble breathing  Per [facility] statutes, patients with seizures are not allowed to drive until they have been seizure-free for six months.  Other recommendations include using caution when using heavy equipment or power tools. Avoid working on ladders or at heights. Take showers instead of baths.  Do not swim alone.  Ensure the water temperature is  not too high  on the home water heater. Do not go swimming alone. Do not lock yourself in a room alone (i.e. bathroom). When caring for infants or small children, sit down when holding, feeding, or changing them to minimize risk of injury to the child in the event you have a seizure. Maintain good sleep hygiene. Avoid alcohol.  Also recommend adequate sleep, hydration, good diet and minimize stress.  During the Seizure  - First, ensure adequate ventilation and place patients on the floor on their left side  Loosen clothing around the neck and ensure the airway is patent. If the patient is clenching the teeth, do not force the mouth open with any object as this can cause severe damage - Remove all items from the surrounding that can be hazardous. The patient may be oblivious to what's happening and may not even know what he or she is doing. If the patient is confused and wandering, either gently guide him/her away and block access to outside areas - Reassure the individual and be comforting - Call 911. In most cases, the seizure ends before EMS arrives. However, there are cases when seizures may last over 3 to 5 minutes. Or the individual may have developed breathing difficulties or severe injuries. If a pregnant patient or a person with diabetes develops a seizure, it is prudent to call an ambulance. - Finally, if the patient does not regain full consciousness, then call EMS. Most patients will remain confused for about 45 to 90 minutes after a seizure, so you must use judgment in calling for help. - Avoid restraints but make sure the patient is in a bed with padded side rails - Place the individual in a lateral position with the neck slightly flexed; this will help the saliva drain from the mouth and prevent the tongue from falling backward - Remove all nearby furniture and other hazards from the area - Provide verbal assurance as the individual is regaining consciousness - Provide the patient with privacy if  possible - Call for help and start treatment as ordered by the caregiver   fter the Seizure (Postictal Stage)  After a seizure, most patients experience confusion, fatigue, muscle pain and/or a headache. Thus, one should permit the individual to sleep. For the next few days, reassurance is essential. Being calm and helping reorient the person is also of importance.  Most seizures are painless and end spontaneously. Seizures are not harmful to others but can lead to complications such as stress on the lungs, brain and the heart. Individuals with prior lung problems may develop labored breathing and respiratory distress.    Orders Placed This Encounter  Procedures  . Lamotrigine level  . Lamotrigine level   Meds ordered this encounter  Medications  . lamoTRIgine (LAMICTAL) 100 MG tablet    Sig: Take 1 tablet (100 mg total) by mouth every Monday, Wednesday, and Friday. Take after dialysis.    Dispense:  90 tablet    Refill:  6  . lamoTRIgine (LAMICTAL) 200 MG tablet    Sig: Take 1 tablet (200 mg total) by mouth 2 (two) times daily.    Dispense:  180 tablet    Refill:  4    Cc: Rikki Spearing, NP  Sarina Ill, MD  [facility] [address] [place], [place] [address]  Phone [phone] Fax [phone]  I spent over 20 minutes of face-to-face and non-face-to-face time with patient on the  1. Long-term current use of anticonvulsant  2. Seizure, late effect of stroke ([place])   3. Breakthrough seizure ([place])    diagnosis.  This included previsit chart review, lab review, study review, order entry, electronic health record documentation, patient education on the different diagnostic and therapeutic options, counseling and coordination of care, risks and benefits of management, compliance, or risk factor reduction

## 2020-08-04 ENCOUNTER — Encounter: Payer: Self-pay | Admitting: Neurology

## 2020-08-04 ENCOUNTER — Other Ambulatory Visit: Payer: Self-pay

## 2020-08-04 ENCOUNTER — Ambulatory Visit (INDEPENDENT_AMBULATORY_CARE_PROVIDER_SITE_OTHER): Payer: No Typology Code available for payment source | Admitting: Neurology

## 2020-08-04 ENCOUNTER — Other Ambulatory Visit: Payer: Self-pay | Admitting: Neurology

## 2020-08-04 VITALS — BP 200/120 | HR 84 | Ht 72.0 in | Wt 260.0 lb

## 2020-08-04 DIAGNOSIS — R569 Unspecified convulsions: Secondary | ICD-10-CM | POA: Diagnosis not present

## 2020-08-04 DIAGNOSIS — Z79899 Other long term (current) drug therapy: Secondary | ICD-10-CM

## 2020-08-04 DIAGNOSIS — I69398 Other sequelae of cerebral infarction: Secondary | ICD-10-CM | POA: Diagnosis not present

## 2020-08-04 DIAGNOSIS — G40919 Epilepsy, unspecified, intractable, without status epilepticus: Secondary | ICD-10-CM

## 2020-08-04 MED ORDER — LAMOTRIGINE 200 MG PO TABS: 200.0000 mg | ORAL_TABLET | Freq: Two times a day (BID) | ORAL | 4 refills | Status: DC

## 2020-08-04 MED ORDER — LAMOTRIGINE 100 MG PO TABS
100.0000 mg | ORAL_TABLET | ORAL | 6 refills | Status: DC
Start: 2020-08-05 — End: 2020-08-05

## 2020-08-04 MED FILL — lamoTRIgine 200 MG TABS: 200 | 90 days supply | Qty: 180 | Fill #0

## 2020-08-04 NOTE — Patient Instructions (Signed)
Take Lamotrigine '200mg'$  in the morning and '200mg'$  in the evening Take 1x'100mg'$  Lamotrigine tab AFTER dialysis M-W-F Come back in 4 weeks for repeat Lamictal Level

## 2020-08-05 LAB — LAMOTRIGINE LEVEL: Lamotrigine Lvl: 11.1 ug/mL (ref 2.0–20.0)

## 2020-08-08 ENCOUNTER — Telehealth: Payer: Self-pay | Admitting: *Deleted

## 2020-08-08 NOTE — Telephone Encounter (Signed)
Encounter opened in error

## 2020-08-08 NOTE — Telephone Encounter (Signed)
Spoke with patient and advised the Lamictal level is good and he should take his Lamictal as discussed with Dr Jaynee Eagles. Pt verbalized understanding and his questions were answered.

## 2020-08-08 NOTE — Telephone Encounter (Signed)
-----   Message from Melvenia Beam, MD sent at 08/06/2020 12:23 PM EST ----- Lamictal level looks good, tell him to take his Lamictal as we discussed thanks

## 2020-08-15 MED FILL — lamoTRIgine 200 MG TABS: 200 | 30 days supply | Qty: 60 | Fill #0

## 2020-08-17 ENCOUNTER — Other Ambulatory Visit: Payer: Self-pay | Admitting: Nephrology

## 2020-08-25 ENCOUNTER — Ambulatory Visit: Payer: BC Managed Care – PPO | Admitting: Neurology

## 2020-09-09 ENCOUNTER — Other Ambulatory Visit: Payer: Self-pay | Admitting: Nurse Practitioner

## 2020-09-09 DIAGNOSIS — Z8673 Personal history of transient ischemic attack (TIA), and cerebral infarction without residual deficits: Secondary | ICD-10-CM

## 2020-09-22 ENCOUNTER — Other Ambulatory Visit: Payer: Self-pay

## 2020-09-22 MED FILL — Lamotrigine Tab 200 MG: ORAL | 90 days supply | Qty: 180 | Fill #0 | Status: AC

## 2020-09-23 ENCOUNTER — Other Ambulatory Visit: Payer: Self-pay

## 2020-09-27 ENCOUNTER — Other Ambulatory Visit: Payer: Self-pay

## 2020-09-27 ENCOUNTER — Ambulatory Visit: Payer: No Typology Code available for payment source | Attending: Nurse Practitioner | Admitting: Nurse Practitioner

## 2020-09-27 ENCOUNTER — Encounter: Payer: Self-pay | Admitting: Nurse Practitioner

## 2020-09-27 VITALS — BP 153/104 | HR 105 | Resp 20 | Ht 72.0 in | Wt 259.8 lb

## 2020-09-27 DIAGNOSIS — E785 Hyperlipidemia, unspecified: Secondary | ICD-10-CM | POA: Diagnosis not present

## 2020-09-27 DIAGNOSIS — Z1159 Encounter for screening for other viral diseases: Secondary | ICD-10-CM

## 2020-09-27 DIAGNOSIS — I69398 Other sequelae of cerebral infarction: Secondary | ICD-10-CM

## 2020-09-27 DIAGNOSIS — N186 End stage renal disease: Secondary | ICD-10-CM

## 2020-09-27 DIAGNOSIS — R569 Unspecified convulsions: Secondary | ICD-10-CM

## 2020-09-27 DIAGNOSIS — I10 Essential (primary) hypertension: Secondary | ICD-10-CM | POA: Diagnosis not present

## 2020-09-27 MED ORDER — CARVEDILOL 6.25 MG PO TABS
ORAL_TABLET | Freq: Two times a day (BID) | ORAL | 0 refills | Status: DC
Start: 2020-09-27 — End: 2021-04-14
  Filled 2020-09-27: qty 60, 30d supply, fill #0

## 2020-09-27 MED ORDER — GABAPENTIN 600 MG PO TABS
300.0000 mg | ORAL_TABLET | Freq: Every day | ORAL | 1 refills | Status: DC
  Filled 2020-09-27: qty 45, 90d supply, fill #0

## 2020-09-27 MED ORDER — APIXABAN 2.5 MG PO TABS
2.5000 mg | ORAL_TABLET | Freq: Two times a day (BID) | ORAL | 3 refills | Status: DC
  Filled 2020-09-27: qty 60, 30d supply, fill #0

## 2020-09-27 MED ORDER — AMLODIPINE BESYLATE 10 MG PO TABS
ORAL_TABLET | Freq: Every day | ORAL | 11 refills | Status: DC
  Filled 2020-09-27 – 2021-04-14 (×2): qty 30, 30d supply, fill #0

## 2020-09-27 MED ORDER — FERRIC CITRATE 1 GM 210 MG(FE) PO TABS
ORAL_TABLET | ORAL | 6 refills | Status: DC
  Filled 2020-09-27: qty 200, 13d supply, fill #0

## 2020-09-27 NOTE — Progress Notes (Signed)
Pt also states that he does not sweat instead his skin becomes very itchy. He is very concerned about issue

## 2020-09-27 NOTE — Progress Notes (Signed)
Assessment & Plan:  [name] was seen today for medication refill.  Diagnoses and all orders for this visit:  Primary hypertension -     CMP14+EGFR -     amLODipine (NORVASC) 10 MG tablet; TAKE 1 TABLET BY MOUTH AT BEDTIME -     carvedilol (COREG) 6.25 MG tablet; TAKE 1 TABLET (6.25 MG TOTAL) BY MOUTH TWO TIMES DAILY WITH A MEAL. Continue all antihypertensives as prescribed.  Remember to bring in your blood pressure log with you for your follow up appointment.  DASH/Mediterranean Diets are healthier choices for HTN.    Dyslipidemia, goal LDL below 100 -     Lipid panel INSTRUCTIONS: Work on a low fat, heart healthy diet and participate in regular aerobic exercise program by working out at least 150 minutes per week; 5 days a week-30 minutes per day. Avoid red meat/beef/steak,  fried foods. junk foods, sodas, sugary drinks, unhealthy snacking, alcohol and smoking.  Drink at least 80 oz of water per day and monitor your carbohydrate intake daily.    Need for hepatitis C screening test -     HCV Ab w Reflex to Quant PCR -     Interpretation:  ESRD (end stage renal disease) (HCC) -     ferric citrate (AURYXIA) 1 GM 210 MG(Fe) tablet; Take 4 tablets by mouth 3 times a day with meals and 2 with snacks. Each tablet is 210 mg. -     CBC with Differential  Seizure, late effect of stroke (HCC) -     gabapentin (NEURONTIN) 600 MG tablet; Take 0.5 tablets (300 mg total) by mouth at bedtime. -     apixaban (ELIQUIS) 2.5 MG TABS tablet; Take 1 tablet (2.5 mg total) by mouth 2 (two) times daily.    Patient has been counseled on age-appropriate routine health concerns for screening and prevention. These are reviewed and up-to-date. Referrals have been placed accordingly. Immunizations are up-to-date or declined.    Subjective:   Chief Complaint  Patient presents with  . Medication Refill   HPI [name] [name] 40 y.o. male presents to office today for follow up. He has a past medical history  of ESRD on HD, Hypertension, Lupus, Seizures, Stroke  I have not seen [name] [name] in over one year.  M-W-F HD days for ESRD Dark stools: taking eliquis and ferric citrate   Essential Hypertension Poorly controlled. He has not been taking his blood pressure medications amlodipine 5m or carvedilol 6.25 mg BID as prescribed. Only takes the  Second dose of carvedilol after his HD appointments on M-F-W. Denies chest pain, shortness of breath, palpitations, lightheadedness, dizziness, headaches or BLE edema. LDL not at goal. Will start atorvastatin 457mdaily.  BP Readings from Last 3 Encounters:  09/27/20 (!) 153/104  08/04/20 (!) 200/120  07/25/20 (!) 176/112   Lab Results  Component Value Date   LDLCALC 114 (H) 09/27/2020   Review of Systems  Constitutional: Negative for fever, malaise/fatigue and weight loss.  HENT: Negative.  Negative for nosebleeds.   Eyes: Negative.  Negative for blurred vision, double vision and photophobia.  Respiratory: Negative.  Negative for cough and shortness of breath.   Cardiovascular: Negative.  Negative for chest pain, palpitations and leg swelling.  Gastrointestinal: Negative.  Negative for heartburn, nausea and vomiting.  Musculoskeletal: Negative.  Negative for myalgias.  Neurological: Positive for seizures (denies any recent seizure activity). Negative for dizziness, focal weakness and headaches.  Psychiatric/Behavioral: Negative.  Negative for suicidal ideas.  Past Medical History:  Diagnosis Date  . Chronic kidney disease   . ESRD (end stage renal disease) ([place])   . Hypertension   . Lupus ([place])   . Seizures ([place])   . Seizures ([place])   . Stroke [facility]    58 or 62 years of age.    . Wears glasses     Past Surgical History:  Procedure Laterality Date  . AV FISTULA PLACEMENT Left 12/30/2017   Procedure: ARTERIOVENOUS (AV) FISTULA CREATION VERSUS INSERTION OF ARTERIOVENOUS GRAFT LEFT ARM;  Surgeon: Rosetta Posner, MD;  Location: [place];   Service: Vascular;  Laterality: Left;  . BASCILIC VEIN TRANSPOSITION Left 08/15/2018   Procedure: LEFT FIRST STAGE BASCILIC VEIN TRANSPOSITION;  Surgeon: Rosetta Posner, MD;  Location: [place];  Service: Vascular;  Laterality: Left;  . BASCILIC VEIN TRANSPOSITION Left 10/30/2018   Procedure: INSERTION OF GORE-TEX GRAFT LEFT UPPER ARM;  Surgeon: Waynetta Sandy, MD;  Location: [place];  Service: Vascular;  Laterality: Left;  . INSERTION OF DIALYSIS CATHETER N/A 09/20/2018   Procedure: INSERTION OF TUNNELED DIALYSIS CATHETER RIGHT INTERNAL JUGULAR;  Surgeon: Marty Heck, MD;  [facility];  Service: Vascular;  Laterality: N/A;  . RENAL BIOPSY      Family History  Problem Relation Age of Onset  . Diabetes Mother   . Diabetes Maternal Grandmother   . Seizures Neg Hx        not on mother's side. he doesn't know about his father's side    Social History Reviewed with no changes to be made today.   Outpatient Medications Prior to Visit  Medication Sig Dispense Refill  . lamoTRIgine (LAMICTAL) 200 MG tablet TAKE 1 TABLET (200 MG TOTAL) BY MOUTH 2 (TWO) TIMES DAILY. 180 tablet 4  . lamoTRIgine (LAMICTAL) 25 MG tablet Take 25 mg by mouth daily. Take medication on Monday, Wednesday, and Friday    . amLODipine (NORVASC) 10 MG tablet TAKE 1 TABLET BY MOUTH AT BEDTIME 30 tablet 11  . Ferric Citrate (AURYXIA PO) Take 4 tablets by mouth 3 times a day with meals and 2 with snacks. Each tablet is 210 mg.    . silver sulfADIAZINE (SILVADENE) 1 % cream Apply to affected skin areas once per day for 10 days or until area is healed (Patient not taking: No sig reported) 50 g 1  . amLODipine (NORVASC) 10 MG tablet Take 1 tablet (10 mg total) by mouth daily. (Patient not taking: Reported on 09/27/2020) 90 tablet 3  . carvedilol (COREG) 6.25 MG tablet TAKE 1 TABLET (6.25 MG TOTAL) BY MOUTH TWO TIMES DAILY WITH A MEAL. (Patient not taking: Reported on 09/27/2020) 60 tablet 0  . lamoTRIgine (LAMICTAL) 100  MG tablet TAKE 2 TABLET (200 MG TOTAL) BY MOUTH TWO TIMES DAILY. (Patient not taking: Reported on 09/27/2020) 120 tablet 0  . lamoTRIgine (LAMICTAL) 100 MG tablet TAKE 1 TABLET (100 MG TOTAL) BY MOUTH EVERY MONDAY, WEDNESDAY, AND FRIDAY. TAKE AFTER DIALYSIS. (Patient not taking: Reported on 09/27/2020) 90 tablet 6   No facility-administered medications prior to visit.    Allergies  Allergen Reactions  . Zyrtec [Cetirizine] Swelling    FACIAL SWELLING, NOT CATEGORIZED  . Zyrtec [Cetirizine]        Objective:    BP (!) 153/104   Pulse (!) 105   Resp 20   Ht 6' (1.829 m)   Wt 259 lb 12.8 oz (117.8 kg)   SpO2 96%   BMI 35.24 kg/m  Wt Readings from Last 3 Encounters:  09/27/20 259 lb 12.8 oz (117.8 kg)  08/04/20 260 lb (117.9 kg)  07/25/20 252 lb 10.4 oz (114.6 kg)    Physical Exam Vitals and nursing note reviewed.  Constitutional:      Appearance: He is well-developed.  HENT:     Head: Normocephalic and atraumatic.  Cardiovascular:     Rate and Rhythm: Normal rate and regular rhythm.     Heart sounds: Normal heart sounds. No murmur heard. No friction rub. No gallop.   Pulmonary:     Effort: Pulmonary effort is normal. No tachypnea or respiratory distress.     Breath sounds: Normal breath sounds. No decreased breath sounds, wheezing, rhonchi or rales.  Chest:     Chest wall: No tenderness.  Abdominal:     General: Bowel sounds are normal.     Palpations: Abdomen is soft.  Musculoskeletal:        General: Normal range of motion.     Cervical back: Normal range of motion.  Skin:    General: Skin is warm and dry.  Neurological:     Mental Status: He is alert and oriented to person, place, and time.     Coordination: Coordination normal.  Psychiatric:        Behavior: Behavior normal. Behavior is cooperative.        Thought Content: Thought content normal.        Judgment: Judgment normal.          Patient has been counseled extensively about nutrition and  exercise as well as the importance of adherence with medications and regular follow-up. The patient was given clear instructions to go to ER or return to medical center if symptoms don't improve, worsen or new problems develop. The patient verbalized understanding.   Follow-up: Return in about 7 weeks (around 11/15/2020) for f/u gabapentin and Blood pressure .   Gildardo Pounds, FNP-BC [facility] and [place], [place]   09/29/2020, 4:25 PM

## 2020-09-28 LAB — CBC WITH DIFFERENTIAL/PLATELET
Basophils Absolute: 0 10*3/uL (ref 0.0–0.2)
Basos: 0 %
EOS (ABSOLUTE): 0.2 10*3/uL (ref 0.0–0.4)
Eos: 2 %
Hematocrit: 38.5 % (ref 37.5–51.0)
Hemoglobin: 13 g/dL (ref 13.0–17.7)
Immature Grans (Abs): 0 10*3/uL (ref 0.0–0.1)
Immature Granulocytes: 0 %
Lymphocytes Absolute: 1.2 10*3/uL (ref 0.7–3.1)
Lymphs: 17 %
MCH: 32.3 pg (ref 26.6–33.0)
MCHC: 33.8 g/dL (ref 31.5–35.7)
MCV: 96 fL (ref 79–97)
Monocytes Absolute: 0.7 10*3/uL (ref 0.1–0.9)
Monocytes: 10 %
Neutrophils Absolute: 5 10*3/uL (ref 1.4–7.0)
Neutrophils: 71 %
Platelets: 69 10*3/uL — CL (ref 150–450)
RBC: 4.03 x10E6/uL — ABNORMAL LOW (ref 4.14–5.80)
RDW: 14.6 % (ref 11.6–15.4)
WBC: 7.2 10*3/uL (ref 3.4–10.8)

## 2020-09-28 LAB — CMP14+EGFR
ALT: 22 IU/L (ref 0–44)
AST: 14 IU/L (ref 0–40)
Albumin/Globulin Ratio: 1.4 (ref 1.2–2.2)
Albumin: 4.6 g/dL (ref 4.0–5.0)
Alkaline Phosphatase: 148 IU/L — ABNORMAL HIGH (ref 44–121)
BUN/Creatinine Ratio: 4 — ABNORMAL LOW (ref 9–20)
BUN: 50 mg/dL — ABNORMAL HIGH (ref 6–24)
Bilirubin Total: 0.4 mg/dL (ref 0.0–1.2)
CO2: 25 mmol/L (ref 20–29)
Calcium: 10.3 mg/dL — ABNORMAL HIGH (ref 8.7–10.2)
Chloride: 94 mmol/L — ABNORMAL LOW (ref 96–106)
Creatinine, Ser: 13.61 mg/dL — ABNORMAL HIGH (ref 0.76–1.27)
Globulin, Total: 3.4 g/dL (ref 1.5–4.5)
Glucose: 103 mg/dL — ABNORMAL HIGH (ref 65–99)
Potassium: 4.6 mmol/L (ref 3.5–5.2)
Sodium: 138 mmol/L (ref 134–144)
Total Protein: 8 g/dL (ref 6.0–8.5)
eGFR: 4 mL/min/{1.73_m2} — ABNORMAL LOW (ref >59)

## 2020-09-28 LAB — LIPID PANEL
Chol/HDL Ratio: 3.5 ratio (ref 0.0–5.0)
Cholesterol, Total: 194 mg/dL (ref 100–199)
HDL: 56 mg/dL (ref >39)
LDL Chol Calc (NIH): 114 mg/dL — ABNORMAL HIGH (ref 0–99)
Triglycerides: 138 mg/dL (ref 0–149)
VLDL Cholesterol Cal: 24 mg/dL (ref 5–40)

## 2020-09-28 LAB — HCV AB W REFLEX TO QUANT PCR: HCV Ab: <0.1 s/co ratio (ref 0.0–0.9)

## 2020-09-28 LAB — HCV INTERPRETATION

## 2020-09-29 ENCOUNTER — Encounter: Payer: Self-pay | Admitting: Nurse Practitioner

## 2020-09-29 ENCOUNTER — Other Ambulatory Visit: Payer: Self-pay

## 2020-09-29 ENCOUNTER — Other Ambulatory Visit: Payer: Self-pay | Admitting: Nurse Practitioner

## 2020-09-29 DIAGNOSIS — D696 Thrombocytopenia, unspecified: Secondary | ICD-10-CM

## 2020-09-29 MED ORDER — ATORVASTATIN CALCIUM 20 MG PO TABS
20.0000 mg | ORAL_TABLET | Freq: Every day | ORAL | 3 refills | Status: DC
  Filled 2020-09-29: qty 90, 90d supply, fill #0

## 2020-09-29 MED ORDER — ATORVASTATIN CALCIUM 40 MG PO TABS
40.0000 mg | ORAL_TABLET | Freq: Every day | ORAL | 3 refills | Status: DC
  Filled 2020-09-29: qty 90, 90d supply, fill #0

## 2020-10-03 ENCOUNTER — Other Ambulatory Visit: Payer: Self-pay

## 2020-10-10 ENCOUNTER — Other Ambulatory Visit: Payer: Self-pay

## 2020-10-28 ENCOUNTER — Other Ambulatory Visit: Payer: Self-pay

## 2020-10-28 MED FILL — Lamotrigine Tab 200 MG: ORAL | 90 days supply | Qty: 180 | Fill #1 | Status: CN

## 2020-10-31 ENCOUNTER — Other Ambulatory Visit: Payer: Self-pay

## 2020-11-05 ENCOUNTER — Other Ambulatory Visit (HOSPITAL_COMMUNITY): Payer: Self-pay

## 2020-12-06 ENCOUNTER — Ambulatory Visit: Payer: No Typology Code available for payment source | Admitting: Family Medicine

## 2020-12-06 NOTE — Progress Notes (Deleted)
No chief complaint on file.    HISTORY OF PRESENT ILLNESS: 12/06/20 ALL:  [name] [name] is a 40 y.o. male here today for follow up for seizures. He had last breakthrough seizure 07/20/20 in setting of Covid PNA and noncompliance with lamotrigine. He has continued lamotrigine '200mg'$  BID with extra '100mg'$  dose after Dialysis on Mon, Wed, and Fri.    HISTORY (copied from Dr Cathren Laine previous note)  Interval history: patient had a breakthrough seizure in the setting of PNA and Covid. 40 y.o. male here as requested by Gildardo Pounds, NP for seizures, strokes now on chronic anticoagulation for anticoagulation positive, breakthrough seizure in the setting of noncompliance with his Lamictal. PMHx  hypertension, lupus, seizures, stroke (47 for 40 years of age), hypertension, chronic anticoagulation, lupus anticoagulant positive, obesity, tobacco dependence, end-stage renal disease on dialysis, prediabetes.   Breakthrough in the setting of PNA and Covid His Lamictal was adjusted in the hospital.     HPI:  [name] [name] is a 40 y.o. male here as requested by Gildardo Pounds, NP for seizures, strokes now on chronic anticoagulation for anticoagulation positive, breakthrough seizure in the setting of noncompliance with his Lamictal. PMHx  hypertension, lupus, seizures, stroke (50 for 40 years of age), hypertension, chronic anticoagulation, lupus anticoagulant positive, obesity, tobacco dependence, end-stage renal disease on dialysis, prediabetes.  I reviewed emergency notes from November 2020: Patient had a witnessed seizure lasting 3-5 minutes while at dialysis, he has a hx of seizures and take lamictal.  There is no seizure activity on arrival but patient did seize in the emergency room, generalized tonic-clonic, no initial focality, generalized shaking, confusion and somnolence, moderate severity, in the context of medical noncompliance, no change in medication no recent head injuries.  At that time he  reported his last seizure was 1 month prior, he says he has been taking his antiseizure medication without change(later admitted he missed doses).  Patient had a witnessed seizure in the emergency room and after speaking with neurology he was loaded with Keppra, CT of the head was negative, patient later admitted he had missed a few days of his Lamictal.   He is here alone. The first time he had a seizure was 8 years ago, his roommate heard him choking and witnessed a seizure. If he misses his medication he has a breakthrough seizure, but if taking every day is well controlled. Last seizures was in November 04/29/2019. We discussed driving restrictions. He reports he had a workup including MRI brain and eeg, its been many years. He does know the seizures are coming. No known family history. No significant history of head trauma. Never had seizures as a child. Described as generalized shaking, tongue biting, he has urinated and defecated. No status epilepticus, never been intubated. Last a minute. He endorses compliance with medication. 15 years ago had a stroke, he was in the hospital for a month, due to drug use and cocaine use. No other focal neurologic deficits, associated symptoms, inciting events or modifiable factors.     REVIEW OF SYSTEMS: Out of a complete 14 system review of symptoms, the patient complains only of the following symptoms, and all other reviewed systems are negative.    ALLERGIES: Allergies  Allergen Reactions   Zyrtec [Cetirizine] Swelling    FACIAL SWELLING, NOT CATEGORIZED   Zyrtec [Cetirizine]      HOME MEDICATIONS: Outpatient Medications Prior to Visit  Medication Sig Dispense Refill   amLODipine (NORVASC) 10 MG tablet TAKE  1 TABLET BY MOUTH AT BEDTIME 30 tablet 11   apixaban (ELIQUIS) 2.5 MG TABS tablet Take 1 tablet (2.5 mg total) by mouth 2 (two) times daily. 180 tablet 3   atorvastatin (LIPITOR) 40 MG tablet Take 1 tablet (40 mg total) by mouth daily. 90  tablet 3   carvedilol (COREG) 6.25 MG tablet TAKE 1 TABLET (6.25 MG TOTAL) BY MOUTH TWO TIMES DAILY WITH A MEAL. 60 tablet 0   ferric citrate (AURYXIA) 1 GM 210 MG(Fe) tablet Take 4 tablets by mouth 3 times a day with meals and 2 with snacks. Each tablet is 210 mg. 270 tablet 6   gabapentin (NEURONTIN) 600 MG tablet Take 0.5 tablets (300 mg total) by mouth at bedtime. 45 tablet 1   lamoTRIgine (LAMICTAL) 200 MG tablet TAKE 1 TABLET (200 MG TOTAL) BY MOUTH 2 (TWO) TIMES DAILY. 180 tablet 4   lamoTRIgine (LAMICTAL) 25 MG tablet Take 25 mg by mouth daily. Take medication on Monday, Wednesday, and Friday     silver sulfADIAZINE (SILVADENE) 1 % cream Apply to affected skin areas once per day for 10 days or until area is healed (Patient not taking: No sig reported) 50 g 1   No facility-administered medications prior to visit.     PAST MEDICAL HISTORY: Past Medical History:  Diagnosis Date   Chronic kidney disease    ESRD (end stage renal disease) ([place])    Hypertension    Lupus ([place])    Seizures ([place])    Seizures ([place])    Stroke ([place])    61 or 40 years of age.     Wears glasses      PAST SURGICAL HISTORY: Past Surgical History:  Procedure Laterality Date   AV FISTULA PLACEMENT Left 12/30/2017   Procedure: ARTERIOVENOUS (AV) FISTULA CREATION VERSUS INSERTION OF ARTERIOVENOUS GRAFT LEFT ARM;  Surgeon: Rosetta Posner, MD;  Location: [place];  Service: Vascular;  Laterality: Left;   [place] Left 08/15/2018   Procedure: LEFT FIRST STAGE [place];  Surgeon: Rosetta Posner, MD;  Location: [place];  Service: Vascular;  Laterality: Left;   [place] Left 10/30/2018   Procedure: INSERTION OF GORE-TEX GRAFT LEFT UPPER ARM;  Surgeon: Waynetta Sandy, MD;  Location: [place];  Service: Vascular;  Laterality: Left;   INSERTION OF DIALYSIS CATHETER N/A 09/20/2018   Procedure: INSERTION OF TUNNELED DIALYSIS CATHETER RIGHT INTERNAL JUGULAR;  Surgeon:  Marty Heck, MD;  [facility];  Service: Vascular;  Laterality: N/A;   RENAL BIOPSY       FAMILY HISTORY: Family History  Problem Relation Age of Onset   Diabetes Mother    Diabetes Maternal Grandmother    Seizures Neg Hx        not on mother's side. he doesn't know about his father's side     SOCIAL HISTORY: Social History   Socioeconomic History   Marital status: Single    Spouse name: Not on file   Number of children: 0   Years of education: Not on file   Highest education level: High school graduate  Occupational History   Not on file  Tobacco Use   Smoking status: Former    Packs/day: 0.25    Pack years: 0.00    Types: Cigarettes    Quit date: 12/21/2017    Years since quitting: 2.9   Smokeless tobacco: Never  Vaping Use   Vaping Use: Never used  Substance and Sexual Activity  Alcohol use: Yes    Comment: "once in awhile"   Drug use: No   Sexual activity: Not Currently  Other Topics Concern   Not on file  Social History Narrative   Lives with a friend   Right handed   Caffeine: rare   Social Determinants of Health   Financial Resource Strain: Not on file  Food Insecurity: Not on file  Transportation Needs: Not on file  Physical Activity: Not on file  Stress: Not on file  Social Connections: Not on file  Intimate Partner Violence: Not on file      PHYSICAL EXAM  There were no vitals filed for this visit. There is no height or weight on file to calculate BMI.   Generalized: Well developed, in no acute distress  Cardiology: normal rate and rhythm, no murmur auscultated  Respiratory: clear to auscultation bilaterally    Neurological examination  Mentation: Alert oriented to time, place, history taking. Follows all commands speech and language fluent Cranial nerve II-XII: Pupils were equal round reactive to light. Extraocular movements were full, visual field were full on confrontational test. Facial sensation and strength were  normal. Uvula tongue midline. Head turning and shoulder shrug  were normal and symmetric. Motor: The motor testing reveals 5 over 5 strength of all 4 extremities. Good symmetric motor tone is noted throughout.  Sensory: Sensory testing is intact to soft touch on all 4 extremities. No evidence of extinction is noted.  Coordination: Cerebellar testing reveals good finger-nose-finger and heel-to-shin bilaterally.  Gait and station: Gait is normal. Tandem gait is normal. Romberg is negative. No drift is seen.  Reflexes: Deep tendon reflexes are symmetric and normal bilaterally.     DIAGNOSTIC DATA (LABS, IMAGING, TESTING) - I reviewed patient records, labs, notes, testing and imaging myself where available.  Lab Results  Component Value Date   WBC 7.2 09/27/2020   HGB 13.0 09/27/2020   HCT 38.5 09/27/2020   MCV 96 09/27/2020   PLT 69 (LL) 09/27/2020      Component Value Date/Time   NA 138 09/27/2020 1140   K 4.6 09/27/2020 1140   CL 94 (L) 09/27/2020 1140   CO2 25 09/27/2020 1140   GLUCOSE 103 (H) 09/27/2020 1140   GLUCOSE 174 (H) 07/25/2020 0359   BUN 50 (H) 09/27/2020 1140   CREATININE 13.61 (H) 09/27/2020 1140   CREATININE 2.06 (H) 10/24/2015 1603   CALCIUM 10.3 (H) 09/27/2020 1140   CALCIUM 8.4 (L) 12/23/2017 0237   PROT 8.0 09/27/2020 1140   ALBUMIN 4.6 09/27/2020 1140   AST 14 09/27/2020 1140   ALT 22 09/27/2020 1140   ALKPHOS 148 (H) 09/27/2020 1140   BILITOT 0.4 09/27/2020 1140   GFRNONAA 3 (L) 07/25/2020 0359   GFRNONAA 40 (L) 10/24/2015 1603   GFRAA 4 (L) 01/14/2020 2020   GFRAA 47 (L) 10/24/2015 1603   Lab Results  Component Value Date   CHOL 194 09/27/2020   HDL 56 09/27/2020   LDLCALC 114 (H) 09/27/2020   TRIG 138 09/27/2020   CHOLHDL 3.5 09/27/2020   Lab Results  Component Value Date   HGBA1C 5.0 07/20/2020   Lab Results  Component Value Date   VITAMINB12 590 02/24/2009   Lab Results  Component Value Date   TSH 0.711 ***Test methodology is 3rd  generation TSH*** 02/16/2009    No flowsheet data found.   No flowsheet data found.   ASSESSMENT AND PLAN  40 y.o. year old male  has a past medical history  of Chronic kidney disease, ESRD (end stage renal disease) ([place]), Hypertension, Lupus ([place]), Seizures ([place]), Seizures ([place]), Stroke ([place]), and Wears glasses. here with     No diagnosis found.    No orders of the defined types were placed in this encounter.    No orders of the defined types were placed in this encounter.    Debbora Presto, MSN, FNP-C 12/06/2020, 8:55 AM  [facility] [address], [place], [place] [address] [phone]

## 2021-01-05 ENCOUNTER — Other Ambulatory Visit: Payer: Self-pay

## 2021-01-05 MED FILL — Lamotrigine Tab 200 MG: ORAL | 90 days supply | Qty: 180 | Fill #1 | Status: AC

## 2021-01-06 ENCOUNTER — Other Ambulatory Visit: Payer: Self-pay

## 2021-01-23 NOTE — Progress Notes (Signed)
Office Visit Note  Patient: [name] [name]             Date of Birth: Dec 26, 1980           MRN: [id number]             PCP: Gildardo Pounds, NP Referring: Gildardo Pounds, NP Visit Date: 01/24/2021   Subjective:  New Patient (Initial Visit) (Patient is currently on dialysis and waiting on kidney transplant. )   History of Present Illness: [name] [name] is a 40 y.o. male here for systemic lupus erythematosus. 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  He was recently diagnosed more than a decade ago and underwent induction treatment with high-dose glucocorticoids and was on mycophenolate previously.  He saw [facility]ology for evaluation of extrarenal disease without a lot of systemic symptoms though he had 2 previous strokes with subsequent encephalomalacia and seizures followed by neurology.  He is on apixaban for long-term anticoagulation apparently previous positives lupus anticoagulant test not confirmed on repeat testing.  He is now on hemodialysis through a left arm arteriovenous fistula access.  He has been off any specific lupus treatment or immunosuppression for some time, previous records indicate hydroxychloroquine prescription but patient denies taking this medicine last year.  Currently he is feeling overall well.  He has reported some type of skin rashes not clear whether exactly photosensitive or not.  He has significant dryness and pruritus worsens with heat exposure.  Currently denies alopecia, oral ulcers, lymphadenopathy, fevers, pleurisy, joint pains, Raynaud's symptoms.  Lupus manifestations Class III/IV nephritis - 2010 biospy CVA x2 Skin rashes  DMARD Hx Prednisone MMF HCQ   Labs reviewed 07/2020 HBV/HCV neg  08/2019 [name], RNP, [place], [place] neg CBC Plts 93 Complement C3 C4 wnl ACA neg B2GP1 neg  12/02/19 MRI Brain IMPRESSION:  Severe chronic small vessel ischemic disease as well as multiple old cortical and lacunar  infarcts.  Activities of Daily Living:  Patient reports morning stiffness for 0 minutes.   Patient Denies nocturnal pain.  Difficulty dressing/grooming: Denies Difficulty climbing stairs: Reports Difficulty getting out of chair: Denies Difficulty using hands for taps, buttons, cutlery, and/or writing: Denies  Review of Systems  Constitutional:  Negative for fatigue.  HENT:  Positive for mouth dryness. Negative for mouth sores and nose dryness.   Eyes:  Negative for pain, itching, visual disturbance and dryness.  Respiratory:  Negative for cough, hemoptysis, shortness of breath and difficulty breathing.   Cardiovascular:  Positive for swelling in legs/feet. Negative for chest pain and palpitations.  Gastrointestinal:  Negative for abdominal pain, blood in stool, constipation and diarrhea.  Endocrine: Negative for increased urination.  Genitourinary:  Negative for painful urination.  Musculoskeletal:  Negative for joint pain, joint pain, joint swelling, myalgias, muscle weakness, morning stiffness, muscle tenderness and myalgias.  Skin:  Negative for color change, rash and redness.  Allergic/Immunologic: Negative for susceptible to infections.  Neurological:  Positive for parasthesias. Negative for dizziness, numbness, headaches, memory loss and weakness.  Hematological:  Negative for swollen glands.  Psychiatric/Behavioral:  Negative for confusion and sleep disturbance.    PMFS History:  Patient Active Problem List   Diagnosis Date Noted   Long-term current use of anticonvulsant 08/04/2020   COVID-19 07/26/2020   Breakthrough seizure ([place]) 07/20/2020   Acute respiratory failure with hypoxia (HCC)    Lupus (HCC)    Seizure, late effect of stroke ([place]) 08/26/2019   Secondary hyperparathyroidism of renal origin ([place])  11/05/2018   Anemia in chronic kidney disease 09/23/2018   ESRD (end stage renal disease) ([place]) 03/18/2018   Prediabetes 01/10/2018   Chest pain in adult    CKD  (chronic kidney disease) stage 3, GFR 30-59 ml/min (HCC) 10/24/2015   Seizures ([place]) 10/24/2015   HTN (hypertension) 01/12/2015   Chronic anticoagulation 04/24/2011   Lupus anticoagulant positive 04/24/2011   OBESITY, NOS 08/15/2006   THROMBOCYTOPENIA 08/15/2006   TOBACCO DEPENDENCE 08/15/2006   CVA 08/15/2006    Past Medical History:  Diagnosis Date   Chronic kidney disease    ESRD (end stage renal disease) ([place])    Hypertension    Lupus ([place])    Seizures ([place])    Seizures ([place])    Stroke ([place])    39 or 40 years of age.     Wears glasses     Family History  Problem Relation Age of Onset   Diabetes Mother    Diabetes Maternal Grandmother    Seizures Neg Hx        not on mother's side. he doesn't know about his father's side   Past Surgical History:  Procedure Laterality Date   AV FISTULA PLACEMENT Left 12/30/2017   Procedure: ARTERIOVENOUS (AV) FISTULA CREATION VERSUS INSERTION OF ARTERIOVENOUS GRAFT LEFT ARM;  Surgeon: Rosetta Posner, MD;  Location: [place];  Service: Vascular;  Laterality: Left;   [place] Left 08/15/2018   Procedure: LEFT FIRST STAGE [place];  Surgeon: Rosetta Posner, MD;  Location: [place];  Service: Vascular;  Laterality: Left;   [place] Left 10/30/2018   Procedure: INSERTION OF GORE-TEX GRAFT LEFT UPPER ARM;  Surgeon: Waynetta Sandy, MD;  Location: [place];  Service: Vascular;  Laterality: Left;   INSERTION OF DIALYSIS CATHETER N/A 09/20/2018   Procedure: INSERTION OF TUNNELED DIALYSIS CATHETER RIGHT INTERNAL JUGULAR;  Surgeon: Marty Heck, MD;  Location: [facility];  Service: Vascular;  Laterality: N/A;   RENAL BIOPSY     Social History   Social History Narrative   Lives with a friend   Right handed   Caffeine: rare   Immunization History  Administered Date(s) Administered   Hepatitis B, adult 10/29/2018, 11/26/2018, 12/24/2018, 05/01/2019   Influenza,inj,Quad PF,6+ Mos 07/11/2017,  07/19/2017, 07/07/2019, 04/25/2020   PFIZER(Purple Top)SARS-COV-2 Vaccination 02/19/2020, 03/14/2020   Pneumococcal Conjugate-13 01/14/2019   Pneumococcal Polysaccharide-23 12/22/2017, 03/18/2019   Tdap 01/12/2015     Objective: Vital Signs: BP (!) 157/113 (BP Location: Right Arm, Patient Position: Sitting, Cuff Size: Normal)   Pulse 75   Ht 6' (1.829 m)   Wt 274 lb (124.3 kg)   BMI 37.16 kg/m    Physical Exam Constitutional:      Appearance: He is obese.  HENT:     Mouth/Throat:     Mouth: Mucous membranes are moist.     Pharynx: Oropharynx is clear.  Eyes:     Conjunctiva/sclera: Conjunctivae normal.  Cardiovascular:     Rate and Rhythm: Normal rate and regular rhythm.     Comments: Left upper arm aVF with palpable thrill Pulmonary:     Effort: Pulmonary effort is normal.     Breath sounds: Normal breath sounds.  Skin:    General: Skin is warm and dry.     Comments: Dry skin, few mild excoriations  Neurological:     General: No focal deficit present.     Mental Status: He is alert.     Deep Tendon Reflexes: Reflexes normal.  Psychiatric:        Mood and Affect: Mood normal.     Musculoskeletal Exam:  Neck full ROM no tenderness Shoulders full ROM no tenderness or swelling Elbows full ROM no tenderness or swelling Wrists full ROM no tenderness or swelling Fingers full ROM no tenderness or swelling No paraspinal tenderness to palpation over upper and lower back Hip normal internal and external rotation without pain, no tenderness to lateral hip palpation Knees full ROM no tenderness or swelling Ankles full ROM no tenderness or swelling MTPs full ROM no tenderness or swelling   Investigation: No additional findings.  Imaging: No results found.  Recent Labs: Lab Results  Component Value Date   WBC 4.7 01/24/2021   HGB 12.3 (L) 01/24/2021   PLT 45 (L) 01/24/2021   NA 138 01/24/2021   K 4.2 01/24/2021   CL 94 (L) 01/24/2021   CO2 32 01/24/2021    GLUCOSE 86 01/24/2021   BUN 49 (H) 01/24/2021   CREATININE 15.72 (H) 01/24/2021   BILITOT 0.4 01/24/2021   ALKPHOS 148 (H) 09/27/2020   AST 15 01/24/2021   ALT 28 01/24/2021   PROT 7.7 01/24/2021   ALBUMIN 4.6 09/27/2020   CALCIUM 9.6 01/24/2021   GFRAA 4 (L) 01/14/2020    Speciality Comments: No specialty comments available.  Procedures:  No procedures performed Allergies: Zyrtec [cetirizine] and Zyrtec [cetirizine]   Assessment / Plan:     Visit Diagnoses: Lupus ([place]) - Plan: Anti-DNA antibody, double-stranded, C3 and C4, CBC with Differential/Platelet, COMPLETE METABOLIC PANEL WITH GFR, Ambulatory referral to Ophthalmology  History of systemic lupus with lupus nephritis currently disease does not seem highly active.  We will check serology including dsDNA complements also blood count metabolic panel.  I would recommend trying to resume hydroxychloroquine treatment this may or may not benefit his skin symptoms but would be protective against further vascular complications from systemic lupus.  Discussed risks of medication including need for periodic retinal toxicity monitoring. We also discussed skin UV protection use of clothing or SPF 30 or better sunblock for prolonged periods in the sun.  Discussed topical skin treatments with moisturizing agents.  THROMBOCYTOPENIA  Chronic thrombocytopenia in the 90s on the reviewed records rechecking this today on CBC.  Chronic anticoagulation Lupus anticoagulant positive  I see single positive lupus anticoagulant test but I do not see a positive confirmatory repeat testing.  Arterial event risk reduction would be greater with Coumadin anticoagulation then apixaban if truly antiphospholipid antibody syndrome but not clear at this time and has been event free on the current anticoagulation.  ESRD (end stage renal disease) ([place])  He is tolerating hemodialysis fairly well I discussed the importance of good follow-up and compliance and  pursuing renal transplant for long-term outcome benefits.  Orders: Orders Placed This Encounter  Procedures   Anti-DNA antibody, double-stranded   C3 and C4   CBC with Differential/Platelet   COMPLETE METABOLIC PANEL WITH GFR   Ambulatory referral to Ophthalmology   No orders of the defined types were placed in this encounter.    Follow-Up Instructions: Return in about 4 weeks (around 02/21/2021) for New pt SLE HCQ start f/u 4wks.   Collier [place], MD  Note - This record has been created using [employer].  Chart creation errors have been sought, but may not always  have been located. Such creation errors do not reflect on  the standard of medical care.

## 2021-01-24 ENCOUNTER — Encounter: Payer: Self-pay | Admitting: Internal Medicine

## 2021-01-24 ENCOUNTER — Ambulatory Visit (INDEPENDENT_AMBULATORY_CARE_PROVIDER_SITE_OTHER): Payer: No Typology Code available for payment source | Admitting: Internal Medicine

## 2021-01-24 ENCOUNTER — Other Ambulatory Visit: Payer: Self-pay

## 2021-01-24 VITALS — BP 157/113 | HR 75 | Ht 72.0 in | Wt 274.0 lb

## 2021-01-24 DIAGNOSIS — M329 Systemic lupus erythematosus, unspecified: Secondary | ICD-10-CM | POA: Diagnosis not present

## 2021-01-24 DIAGNOSIS — R760 Raised antibody titer: Secondary | ICD-10-CM | POA: Diagnosis not present

## 2021-01-24 DIAGNOSIS — Z7901 Long term (current) use of anticoagulants: Secondary | ICD-10-CM

## 2021-01-24 DIAGNOSIS — D696 Thrombocytopenia, unspecified: Secondary | ICD-10-CM

## 2021-01-24 DIAGNOSIS — N186 End stage renal disease: Secondary | ICD-10-CM

## 2021-01-24 NOTE — Progress Notes (Signed)
Pharmacy Note  Subjective: Patient presents today to [facility] for follow up office visit.   Patient seen by the pharmacist for counseling on hydroxychloroquine systemic lupus erythematosus.   Objective: CMP     Component Value Date/Time   NA 138 09/27/2020 1140   K 4.6 09/27/2020 1140   CL 94 (L) 09/27/2020 1140   CO2 25 09/27/2020 1140   GLUCOSE 103 (H) 09/27/2020 1140   GLUCOSE 174 (H) 07/25/2020 0359   BUN 50 (H) 09/27/2020 1140   CREATININE 13.61 (H) 09/27/2020 1140   CREATININE 2.06 (H) 10/24/2015 1603   CALCIUM 10.3 (H) 09/27/2020 1140   CALCIUM 8.4 (L) 12/23/2017 0237   PROT 8.0 09/27/2020 1140   ALBUMIN 4.6 09/27/2020 1140   AST 14 09/27/2020 1140   ALT 22 09/27/2020 1140   ALKPHOS 148 (H) 09/27/2020 1140   BILITOT 0.4 09/27/2020 1140   GFRNONAA 3 (L) 07/25/2020 0359   GFRNONAA 40 (L) 10/24/2015 1603   GFRAA 4 (L) 01/14/2020 2020   GFRAA 47 (L) 10/24/2015 1603    CBC    Component Value Date/Time   WBC 7.2 09/27/2020 1140   WBC 8.2 07/25/2020 0359   RBC 4.03 (L) 09/27/2020 1140   RBC 3.47 (L) 07/25/2020 0359   HGB 13.0 09/27/2020 1140   HGB 15.5 10/14/2013 1434   HCT 38.5 09/27/2020 1140   HCT 45.6 10/14/2013 1434   PLT 69 (LL) 09/27/2020 1140   MCV 96 09/27/2020 1140   MCV 87.2 10/14/2013 1434   MCH 32.3 09/27/2020 1140   MCH 32.3 07/25/2020 0359   MCHC 33.8 09/27/2020 1140   MCHC 33.5 07/25/2020 0359   RDW 14.6 09/27/2020 1140   RDW 13.0 10/14/2013 1434   LYMPHSABS 1.2 09/27/2020 1140   LYMPHSABS 1.4 10/14/2013 1434   MONOABS 0.4 07/25/2020 0359   MONOABS 0.5 10/14/2013 1434   EOSABS 0.2 09/27/2020 1140   BASOSABS 0.0 09/27/2020 1140   BASOSABS 0.0 10/14/2013 1434   Assessment/Plan:  Patient was counseled on the purpose, proper use, and adverse effects of hydroxychloroquine including nausea/diarrhea, skin rash, headaches, and sun sensitivity.  Advised patient to wear sunscreen once starting hydroxychloroquine to reduce risk of  rash associated with sun sensitivity.  Discussed importance of annual eye exams while on hydroxychloroquine to monitor to ocular toxicity and discussed importance of frequent laboratory monitoring.  Provided patient with eye exam form for baseline ophthalmologic exam.  Reviewed risk for QTC prolongation when used in combination with other QTc prolonging agents (including but not limited to antiarrhythmics, macrolide antibiotics, flouroquinolones, tricyclic antidepressants, citalopram, specific antipsychotics, ondansetron, migraine triptans, and methadone).  He is not currently taking any medications with significant interactions. Provided patient with educational materials on hydroxychloroquine and answered all questions.  Patient consented to hydroxychloroquine. Will upload consent in the media tab.    Referral to ophthalmology placed today. Patient aware that he should take exam form to appointment for provider to complete and fax back to our clinic.  Dose will be Plaquenil 200 mg twice daily. Prescription pending baseline labs. Patient aware that rx will not be sent until labs from today result.  Knox Saliva, PharmD, MPH, BCPS Clinical Pharmacist (Rheumatology and Pulmonology)

## 2021-01-24 NOTE — Patient Instructions (Addendum)
I recommend starting hydroxychloroquine a daily oral medication for lupus.  Hemodialysis often worsens dry skin and itching symptoms. I typically recommend or see improvement with topical moisturizing cream or ointment such as Cetaphil or CeraVe which are available over the counter.  If symptoms get worse or very bothersome we can talk about whether taking oral medication would be helpful.  When out in the sun for more than a few minutes at a time, I recommend wearing sleeves and a hat or using SPF 30 or higher sunblock to protect your skin.    Dry Skin Care  What causes dry skin?  Dry skin is common and results from inadequate moisture in the outer skin layers. Dry skin usually results from the excessive loss of moisture from the skin surface. This occurs due to two major factors: Normally the skin's oil glands deposit a layer of oil on the skin's surface. This layer of oil prevents the loss of moisture from the skin. Exposure to soaps, cleaners, solvents, and disinfectants removes this oily film, allowing water to escape. Water loss from the skin increases when the humidity is low. During winter months we spend a lot of time indoors where the air is heated. Heated air has very low humidity. This also contributes to dry skin.  A tendency for dry skin may accompany such disorders as eczema. Also, as people age, the number of functioning oil glands decreases, and the tendency toward dry skin can be a sensation of skin tightness when emerging from the shower.  How do I manage dry skin?  Humidify your environment. This can be accomplished by using a humidifier in your bedroom at night during winter months. Bathing can actually put moisture back into your skin if done right. Take the following steps while bathing to sooth dry skin: Avoid hot water, which only dries the skin and makes itching worse. Use warm water. Avoid washcloths or extensive rubbing or scrubbing. Use mild soaps like unscented  Dove, Oil of Olay, Cetaphil, Basis, or CeraVe. If you take baths rather than showers, rinse off soap residue with clean water before getting out of tub. Once out of the shower/tub, pat dry gently with a soft towel. Leave your skin damp. While still damp, apply any medicated ointment/cream you were prescribed to the affected areas. After you apply your medicated ointment/cream, then apply your moisturizer to your whole body.This is the most important step in dry skin care. If this is omitted, your skin will continue to be dry. The choice of moisturizer is also very important. In general, lotion will not provider enough moisture to severely dry skin because it is water based. You should use an ointment or cream. Moisturizers should also be unscented. Good choices include Vaseline (plain petrolatum), Aquaphor, Cetaphil, CeraVe, Vanicream, DML Forte, Aveeno moisture, or Eucerin Cream. Bath oils can be helpful, but do not replace the application of moisturizer after the bath. In addition, they make the tub slippery causing an increased risk for falls. Therefore, we do not recommend their use.  Pruritus Pruritus is an itchy feeling on the skin. One of the most common causes is dry skin, but many different things can cause itching. Most cases of itching do notrequire medical attention. Sometimes itchy skin can turn into a rash. Follow these instructions at home: Skin care  Apply moisturizing lotion to your skin as needed. Lotion that contains petroleum jelly is best. Take medicines or apply medicated creams only as told by your health care provider. This may  include: Corticosteroid cream. Anti-itch lotions. Oral antihistamines. Apply a cool, wet cloth (cool compress) to the affected areas. Take baths with one of the following: Epsom salts. You can get these at your local pharmacy or grocery store. Follow the instructions on the packaging. Baking soda. Pour a small amount into the bath as told by your  health care provider. Colloidal oatmeal. You can get this at your local pharmacy or grocery store. Follow the instructions on the packaging. Apply baking soda paste to your skin. To make the paste, stir water into a small amount of baking soda until it reaches a paste-like consistency. Do not scratch your skin. Do not take hot showers or baths, which can make itching worse. A cool shower may help with itching as long as you apply moisturizing lotion after the shower. Do not use scented soaps, detergents, perfumes, and cosmetic products. Instead, use gentle, unscented versions of these items.

## 2021-01-25 ENCOUNTER — Telehealth: Payer: Self-pay

## 2021-01-25 LAB — CBC WITH DIFFERENTIAL/PLATELET
Absolute Monocytes: 526 cells/uL (ref 200–950)
Basophils Absolute: 38 cells/uL (ref 0–200)
Basophils Relative: 0.8 %
Eosinophils Absolute: 240 cells/uL (ref 15–500)
Eosinophils Relative: 5.1 %
HCT: 37.3 % — ABNORMAL LOW (ref 38.5–50.0)
Hemoglobin: 12.3 g/dL — ABNORMAL LOW (ref 13.2–17.1)
Lymphs Abs: 1025 cells/uL (ref 850–3900)
MCH: 31.9 pg (ref 27.0–33.0)
MCHC: 33 g/dL (ref 32.0–36.0)
MCV: 96.6 fL (ref 80.0–100.0)
MPV: 12.5 fL (ref 7.5–12.5)
Monocytes Relative: 11.2 %
Neutro Abs: 2872 cells/uL (ref 1500–7800)
Neutrophils Relative %: 61.1 %
Platelets: 45 10*3/uL — ABNORMAL LOW (ref 140–400)
RBC: 3.86 10*6/uL — ABNORMAL LOW (ref 4.20–5.80)
RDW: 13.8 % (ref 11.0–15.0)
Total Lymphocyte: 21.8 %
WBC: 4.7 10*3/uL (ref 3.8–10.8)

## 2021-01-25 LAB — COMPLETE METABOLIC PANEL WITH GFR
AG Ratio: 1.1 (calc) (ref 1.0–2.5)
ALT: 28 U/L (ref 9–46)
AST: 15 U/L (ref 10–40)
Albumin: 4 g/dL (ref 3.6–5.1)
Alkaline phosphatase (APISO): 129 U/L (ref 36–130)
BUN/Creatinine Ratio: 3 (calc) — ABNORMAL LOW (ref 6–22)
BUN: 49 mg/dL — ABNORMAL HIGH (ref 7–25)
CO2: 32 mmol/L (ref 20–32)
Calcium: 9.6 mg/dL (ref 8.6–10.3)
Chloride: 94 mmol/L — ABNORMAL LOW (ref 98–110)
Creat: 15.72 mg/dL — ABNORMAL HIGH (ref 0.60–1.29)
Globulin: 3.7 g/dL (calc) (ref 1.9–3.7)
Glucose, Bld: 86 mg/dL (ref 65–99)
Potassium: 4.2 mmol/L (ref 3.5–5.3)
Sodium: 138 mmol/L (ref 135–146)
Total Bilirubin: 0.4 mg/dL (ref 0.2–1.2)
Total Protein: 7.7 g/dL (ref 6.1–8.1)
eGFR: 4 mL/min/{1.73_m2} — ABNORMAL LOW (ref >=60)

## 2021-01-25 LAB — ANTI-DNA ANTIBODY, DOUBLE-STRANDED: ds DNA Ab: 29 IU/mL — ABNORMAL HIGH

## 2021-01-25 LAB — C3 AND C4
C3 Complement: 104 mg/dL (ref 82–185)
C4 Complement: 18 mg/dL (ref 15–53)

## 2021-01-25 NOTE — Telephone Encounter (Signed)
[facility] left a voicemail with an urgent lab report for patient.  Please call [phone] and provide reference # [id number]

## 2021-01-25 NOTE — Telephone Encounter (Signed)
Critical result flagged was serum creatinine of 15.72 this is not an urgent problem due to the patient is on hemodialysis for renal failure already. Review of other abnormal labs also shows platelet count of 45 this is decreasing compared to his previous of 64 and will need further evaluation. Will wait for other final results to complete and then discuss with patient.

## 2021-01-25 NOTE — Telephone Encounter (Signed)
Lab results were faxed as well, provided Dr. Benjamine Mola with a copy of these.

## 2021-02-03 NOTE — Progress Notes (Signed)
Platelet count is decreased compared to previous rest of CBC unremarkable. dsDNA is mildly positive suggesting some ongoing disease activity. Recommend he return next week for repeating the lab test and with smear review may indicate ITP.

## 2021-02-03 NOTE — Addendum Note (Signed)
Addended by: Collier [place] on: 02/03/2021 04:14 PM   Modules accepted: Orders

## 2021-02-13 ENCOUNTER — Other Ambulatory Visit: Payer: Self-pay | Admitting: *Deleted

## 2021-02-13 DIAGNOSIS — D696 Thrombocytopenia, unspecified: Secondary | ICD-10-CM

## 2021-02-13 DIAGNOSIS — M329 Systemic lupus erythematosus, unspecified: Secondary | ICD-10-CM

## 2021-02-14 LAB — MANUAL DIFFERENTIAL
HCT: 44.2 % (ref 38.5–50.0)
Hemoglobin: 15 g/dL (ref 13.2–17.1)
MCH: 32.1 pg (ref 27.0–33.0)
MCHC: 33.9 g/dL (ref 32.0–36.0)
MCV: 94.4 fL (ref 80.0–100.0)
MPV: 11.4 fL (ref 7.5–12.5)
Platelets: 43 10*3/uL — ABNORMAL LOW (ref 140–400)
RBC: 4.68 10*6/uL (ref 4.20–5.80)
RDW: 14.1 % (ref 11.0–15.0)
WBC: 7.6 10*3/uL (ref 3.8–10.8)

## 2021-02-14 LAB — DIFFERENTIAL, MANUAL RFLX
ABSOLUTE NUCLEATED RBC: 76 cells/uL — ABNORMAL HIGH
Absolute Lymphocytes: 1748 cells/uL (ref 850–3900)
Absolute Monocytes: 684 cells/uL (ref 200–950)
Basophils Absolute: 76 cells/uL (ref 0–200)
Basophils Relative: 1 %
Eosinophils Absolute: 152 cells/uL (ref 15–500)
Eosinophils Relative: 2 %
Monocytes Relative: 9 %
NUCLEATED RBC: 1 /100 WBC — ABNORMAL HIGH
Neutro Abs: 4940 cells/uL (ref 1500–7800)
Neutrophils Relative %: 65 %
Total Lymphocyte: 23 %

## 2021-04-11 ENCOUNTER — Ambulatory Visit
Admission: EM | Admit: 2021-04-11 | Discharge: 2021-04-11 | Disposition: A | Discharge status: Home / Self Care | Payer: No Typology Code available for payment source | Attending: Physician Assistant | Admitting: Physician Assistant

## 2021-04-11 ENCOUNTER — Other Ambulatory Visit: Payer: Self-pay

## 2021-04-11 DIAGNOSIS — J209 Acute bronchitis, unspecified: Secondary | ICD-10-CM

## 2021-04-11 MED ORDER — PROMETHAZINE-DM 6.25-15 MG/5ML PO SYRP: 5.0000 mL | ORAL_SOLUTION | Freq: Four times a day (QID) | ORAL | 0 refills | Status: AC | PRN

## 2021-04-11 MED ORDER — PREDNISONE 20 MG PO TABS: 40.0000 mg | ORAL_TABLET | Freq: Every day | ORAL | 0 refills | Status: AC

## 2021-04-11 NOTE — ED Triage Notes (Signed)
One week h/o cough that is interfering with his sleep. Has been taking Nyquil with temporary relief. Denies congestion, but notes rhinorrhea at the onset.

## 2021-04-11 NOTE — ED Provider Notes (Signed)
[facility]    CSN: [id number] Arrival date & time: 04/11/21  1032      History   Chief Complaint Chief Complaint  Patient presents with   Cough    HPI [name] [name] is a 40 y.o. male.   Patient here today for evaluation of cough that he has had for the last week.  He denies any significant nasal congestion or drainage.  He has not had sore throat or ear pain.  He denies any treatment for symptoms.  He reports cough seems worse at nighttime or when he talks a lot.  He denies fever or chills.  Has not had any nausea, vomiting or diarrhea.  The history is provided by the patient.  Cough Associated symptoms: shortness of breath   Associated symptoms: no chills, no ear pain, no eye discharge, no fever and no sore throat    Past Medical History:  Diagnosis Date   Chronic kidney disease    ESRD (end stage renal disease) ([place])    Hypertension    Lupus ([place])    Seizures ([place])    Seizures ([place])    Stroke ([place])    34 or 40 years of age.     Wears glasses     Patient Active Problem List   Diagnosis Date Noted   Long-term current use of anticonvulsant 08/04/2020   COVID-19 07/26/2020   Breakthrough seizure ([place]) 07/20/2020   Acute respiratory failure with hypoxia (HCC)    Lupus ([place])    Seizure, late effect of stroke ([place]) 08/26/2019   Secondary hyperparathyroidism of renal origin ([place]) 11/05/2018   Anemia in chronic kidney disease 09/23/2018   ESRD (end stage renal disease) ([place]) 03/18/2018   Prediabetes 01/10/2018   Chest pain in adult    CKD (chronic kidney disease) stage 3, GFR 30-59 ml/min (HCC) 10/24/2015   Seizures ([place]) 10/24/2015   HTN (hypertension) 01/12/2015   Chronic anticoagulation 04/24/2011   Lupus anticoagulant positive 04/24/2011   OBESITY, NOS 08/15/2006   THROMBOCYTOPENIA 08/15/2006   TOBACCO DEPENDENCE 08/15/2006   CVA 08/15/2006    Past Surgical History:  Procedure Laterality Date   AV FISTULA PLACEMENT Left 12/30/2017    Procedure: ARTERIOVENOUS (AV) FISTULA CREATION VERSUS INSERTION OF ARTERIOVENOUS GRAFT LEFT ARM;  Surgeon: Rosetta Posner, MD;  Location: [place];  Service: Vascular;  Laterality: Left;   [place] Left 08/15/2018   Procedure: LEFT FIRST STAGE [place];  Surgeon: Rosetta Posner, MD;  Location: [place];  Service: Vascular;  Laterality: Left;   [place] Left 10/30/2018   Procedure: INSERTION OF GORE-TEX GRAFT LEFT UPPER ARM;  Surgeon: Waynetta Sandy, MD;  Location: [place];  Service: Vascular;  Laterality: Left;   INSERTION OF DIALYSIS CATHETER N/A 09/20/2018   Procedure: INSERTION OF TUNNELED DIALYSIS CATHETER RIGHT INTERNAL JUGULAR;  Surgeon: Marty Heck, MD;  [facility];  Service: Vascular;  Laterality: N/A;   RENAL BIOPSY         Home Medications    Prior to Admission medications   Medication Sig Start Date End Date Taking? Authorizing Provider  predniSONE (DELTASONE) 20 MG tablet Take 2 tablets (40 mg total) by mouth daily with breakfast for 5 days. 04/11/21 04/16/21 Yes Francene Finders, PA-C  promethazine-dextromethorphan (PROMETHAZINE-DM) 6.25-15 MG/5ML syrup Take 5 mLs by mouth 4 (four) times daily as needed for up to 7 days for cough. 04/11/21 04/18/21 Yes Francene Finders, PA-C  amLODipine (NORVASC) 10 MG tablet  TAKE 1 TABLET BY MOUTH AT BEDTIME 09/27/20 09/27/21  Gildardo Pounds, NP  apixaban (ELIQUIS) 2.5 MG TABS tablet Take 1 tablet (2.5 mg total) by mouth 2 (two) times daily. 09/27/20 12/26/20  Gildardo Pounds, NP  atorvastatin (LIPITOR) 40 MG tablet Take 1 tablet (40 mg total) by mouth daily. 09/29/20   Gildardo Pounds, NP  carvedilol (COREG) 6.25 MG tablet TAKE 1 TABLET (6.25 MG TOTAL) BY MOUTH TWO TIMES DAILY WITH A MEAL. 09/27/20 09/27/21  Gildardo Pounds, NP  ferric citrate (AURYXIA) 1 GM 210 MG(Fe) tablet Take 4 tablets by mouth 3 times a day with meals and 2 with snacks. Each tablet is 210 mg. 09/27/20    Gildardo Pounds, NP  gabapentin (NEURONTIN) 100 MG capsule Take 100 mg by mouth at bedtime. 12/25/20   [provider]  gabapentin (NEURONTIN) 600 MG tablet Take 0.5 tablets (300 mg total) by mouth at bedtime. 09/27/20 12/26/20  Gildardo Pounds, NP  hydrOXYzine (ATARAX/VISTARIL) 25 MG tablet Take 25 mg by mouth 3 (three) times daily as needed. 11/17/20   [provider]  lamoTRIgine (LAMICTAL) 200 MG tablet TAKE 1 TABLET (200 MG TOTAL) BY MOUTH 2 (TWO) TIMES DAILY. 08/04/20 08/04/21  Melvenia Beam, MD  lamoTRIgine (LAMICTAL) 25 MG tablet Take 25 mg by mouth daily. Take medication on Monday, Wednesday, and Friday Patient not taking: Reported on 01/24/2021    [provider]  silver sulfADIAZINE (SILVADENE) 1 % cream Apply to affected skin areas once per day for 10 days or until area is healed Patient not taking: No sig reported 11/24/18   Antony Blackbird, MD    Family History Family History  Problem Relation Age of Onset   Diabetes Mother    Diabetes Maternal Grandmother    Seizures Neg Hx        not on mother's side. he doesn't know about his father's side    Social History Social History   Tobacco Use   Smoking status: Former    Packs/day: 0.25    Types: Cigarettes    Quit date: 12/21/2017    Years since quitting: 3.3   Smokeless tobacco: Never  Vaping Use   Vaping Use: Never used  Substance Use Topics   Alcohol use: Not Currently   Drug use: No     Allergies   Zyrtec [cetirizine] and Zyrtec [cetirizine]   Review of Systems Review of Systems  Constitutional:  Negative for chills and fever.  HENT:  Negative for congestion, ear pain and sore throat.   Eyes:  Negative for discharge and redness.  Respiratory:  Positive for cough and shortness of breath.   Gastrointestinal:  Negative for diarrhea, nausea and vomiting.  Skin:  Positive for color change and wound.  Neurological:  Negative for numbness.    Physical Exam Triage Vital Signs ED Triage  Vitals  Enc Vitals Group     BP 04/11/21 1250 (!) 161/105     Pulse Rate 04/11/21 1250 93     Resp 04/11/21 1250 18     Temp 04/11/21 1250 98.8 F (37.1 C)     Temp Source 04/11/21 1250 Oral     SpO2 04/11/21 1250 97 %     Weight --      Height --      Head Circumference --      Peak Flow --      Pain Score 04/11/21 1252 0     Pain Loc --  Pain Edu? --      Excl. in [place]? --    No data found.  Updated Vital Signs BP (!) 161/105 (BP Location: Right Arm) Comment: No BP meds taken today  Pulse 93   Temp 98.8 F (37.1 C) (Oral)   Resp 18   SpO2 97%     Physical Exam Vitals and nursing note reviewed.  Constitutional:      General: He is not in acute distress.    Appearance: Normal appearance. He is not ill-appearing.  HENT:     Head: Normocephalic and atraumatic.  Eyes:     Conjunctiva/sclera: Conjunctivae normal.  Cardiovascular:     Rate and Rhythm: Normal rate and regular rhythm.     Heart sounds: Normal heart sounds. No murmur heard. Pulmonary:     Effort: Pulmonary effort is normal. No respiratory distress.     Breath sounds: Normal breath sounds. No wheezing, rhonchi or rales.  Neurological:     Mental Status: He is alert.  Psychiatric:        Mood and Affect: Mood normal.        Behavior: Behavior normal.        Thought Content: Thought content normal.     UC Treatments / Results  Labs (all labs ordered are listed, but only abnormal results are displayed) Labs Reviewed - No data to display  EKG   Radiology No results found.  Procedures Procedures (including critical care time)  Medications Ordered in UC Medications - No data to display  Initial Impression / Assessment and Plan / UC Course  I have reviewed the triage vital signs and the nursing notes.  Pertinent labs & imaging results that were available during my care of the patient were reviewed by me and considered in my medical decision making (see chart for details).  Suspect likely  bronchitis.  Will treat with steroid burst and cough syrup prescribed.  Recommended follow-up if symptoms fail to improve or worsen anyway.  Final Clinical Impressions(s) / UC Diagnoses   Final diagnoses:  Acute bronchitis, unspecified organism   Discharge Instructions   None    ED Prescriptions     Medication Sig Dispense Auth. Provider   predniSONE (DELTASONE) 20 MG tablet Take 2 tablets (40 mg total) by mouth daily with breakfast for 5 days. 10 tablet Francene Finders, PA-C   promethazine-dextromethorphan (PROMETHAZINE-DM) 6.25-15 MG/5ML syrup Take 5 mLs by mouth 4 (four) times daily as needed for up to 7 days for cough. 118 mL Francene Finders, PA-C      PDMP not reviewed this encounter.   Francene Finders, PA-C 04/11/21 1356

## 2021-04-12 ENCOUNTER — Other Ambulatory Visit: Payer: Self-pay

## 2021-04-12 MED FILL — Lamotrigine Tab 200 MG: ORAL | 90 days supply | Qty: 180 | Fill #2 | Status: AC

## 2021-04-13 ENCOUNTER — Other Ambulatory Visit: Payer: Self-pay

## 2021-04-14 ENCOUNTER — Other Ambulatory Visit: Payer: Self-pay

## 2021-04-14 ENCOUNTER — Other Ambulatory Visit: Payer: Self-pay | Admitting: Nurse Practitioner

## 2021-04-14 DIAGNOSIS — I10 Essential (primary) hypertension: Secondary | ICD-10-CM

## 2021-04-15 MED ORDER — CARVEDILOL 6.25 MG PO TABS
ORAL_TABLET | Freq: Two times a day (BID) | ORAL | 0 refills | Status: DC
  Filled 2021-04-15: qty 60, 30d supply, fill #0

## 2021-04-17 ENCOUNTER — Other Ambulatory Visit: Payer: Self-pay

## 2021-04-19 DIAGNOSIS — T7840XA Allergy, unspecified, initial encounter: Secondary | ICD-10-CM | POA: Insufficient documentation

## 2021-04-19 DIAGNOSIS — T782XXS Anaphylactic shock, unspecified, sequela: Secondary | ICD-10-CM | POA: Insufficient documentation

## 2021-04-19 DIAGNOSIS — L299 Pruritus, unspecified: Secondary | ICD-10-CM | POA: Insufficient documentation

## 2021-04-24 ENCOUNTER — Other Ambulatory Visit: Payer: Self-pay

## 2021-05-04 ENCOUNTER — Emergency Department (HOSPITAL_COMMUNITY): Payer: No Typology Code available for payment source

## 2021-05-04 ENCOUNTER — Encounter (HOSPITAL_COMMUNITY): Payer: Self-pay | Admitting: Oncology

## 2021-05-04 ENCOUNTER — Other Ambulatory Visit: Payer: Self-pay

## 2021-05-04 ENCOUNTER — Inpatient Hospital Stay (HOSPITAL_COMMUNITY)
Admission: EM | Admit: 2021-05-04 | Discharge: 2021-05-07 | DRG: 291 | Disposition: A | Discharge status: Home / Self Care | Payer: No Typology Code available for payment source | Attending: Internal Medicine | Admitting: Internal Medicine

## 2021-05-04 DIAGNOSIS — Z9114 Patient's other noncompliance with medication regimen: Secondary | ICD-10-CM

## 2021-05-04 DIAGNOSIS — D649 Anemia, unspecified: Secondary | ICD-10-CM | POA: Diagnosis present

## 2021-05-04 DIAGNOSIS — I69398 Other sequelae of cerebral infarction: Secondary | ICD-10-CM

## 2021-05-04 DIAGNOSIS — Z20822 Contact with and (suspected) exposure to covid-19: Secondary | ICD-10-CM | POA: Diagnosis present

## 2021-05-04 DIAGNOSIS — D539 Nutritional anemia, unspecified: Secondary | ICD-10-CM | POA: Diagnosis present

## 2021-05-04 DIAGNOSIS — R0902 Hypoxemia: Secondary | ICD-10-CM

## 2021-05-04 DIAGNOSIS — J9601 Acute respiratory failure with hypoxia: Secondary | ICD-10-CM | POA: Diagnosis not present

## 2021-05-04 DIAGNOSIS — I6789 Other cerebrovascular disease: Secondary | ICD-10-CM | POA: Diagnosis present

## 2021-05-04 DIAGNOSIS — R760 Raised antibody titer: Secondary | ICD-10-CM | POA: Diagnosis present

## 2021-05-04 DIAGNOSIS — I132 Hypertensive heart and chronic kidney disease with heart failure and with stage 5 chronic kidney disease, or end stage renal disease: Secondary | ICD-10-CM | POA: Diagnosis not present

## 2021-05-04 DIAGNOSIS — Z79899 Other long term (current) drug therapy: Secondary | ICD-10-CM

## 2021-05-04 DIAGNOSIS — I10 Essential (primary) hypertension: Secondary | ICD-10-CM | POA: Diagnosis not present

## 2021-05-04 DIAGNOSIS — D631 Anemia in chronic kidney disease: Secondary | ICD-10-CM | POA: Diagnosis present

## 2021-05-04 DIAGNOSIS — R569 Unspecified convulsions: Secondary | ICD-10-CM

## 2021-05-04 DIAGNOSIS — M898X9 Other specified disorders of bone, unspecified site: Secondary | ICD-10-CM | POA: Diagnosis present

## 2021-05-04 DIAGNOSIS — R059 Cough, unspecified: Secondary | ICD-10-CM

## 2021-05-04 DIAGNOSIS — Z992 Dependence on renal dialysis: Secondary | ICD-10-CM | POA: Diagnosis present

## 2021-05-04 DIAGNOSIS — Z888 Allergy status to other drugs, medicaments and biological substances status: Secondary | ICD-10-CM

## 2021-05-04 DIAGNOSIS — I509 Heart failure, unspecified: Secondary | ICD-10-CM | POA: Diagnosis present

## 2021-05-04 DIAGNOSIS — Z87891 Personal history of nicotine dependence: Secondary | ICD-10-CM

## 2021-05-04 DIAGNOSIS — D696 Thrombocytopenia, unspecified: Secondary | ICD-10-CM | POA: Diagnosis present

## 2021-05-04 DIAGNOSIS — E669 Obesity, unspecified: Secondary | ICD-10-CM | POA: Diagnosis present

## 2021-05-04 DIAGNOSIS — Z7901 Long term (current) use of anticoagulants: Secondary | ICD-10-CM

## 2021-05-04 DIAGNOSIS — G40909 Epilepsy, unspecified, not intractable, without status epilepticus: Secondary | ICD-10-CM | POA: Diagnosis present

## 2021-05-04 DIAGNOSIS — N186 End stage renal disease: Secondary | ICD-10-CM | POA: Diagnosis not present

## 2021-05-04 DIAGNOSIS — Z86711 Personal history of pulmonary embolism: Secondary | ICD-10-CM

## 2021-05-04 DIAGNOSIS — Z86718 Personal history of other venous thrombosis and embolism: Secondary | ICD-10-CM

## 2021-05-04 DIAGNOSIS — Z91199 Patient's noncompliance with other medical treatment and regimen due to unspecified reason: Secondary | ICD-10-CM

## 2021-05-04 DIAGNOSIS — Z833 Family history of diabetes mellitus: Secondary | ICD-10-CM

## 2021-05-04 DIAGNOSIS — N2581 Secondary hyperparathyroidism of renal origin: Secondary | ICD-10-CM | POA: Diagnosis present

## 2021-05-04 DIAGNOSIS — D6862 Lupus anticoagulant syndrome: Secondary | ICD-10-CM | POA: Diagnosis present

## 2021-05-04 DIAGNOSIS — Z6836 Body mass index (BMI) 36.0-36.9, adult: Secondary | ICD-10-CM

## 2021-05-04 DIAGNOSIS — R0602 Shortness of breath: Secondary | ICD-10-CM

## 2021-05-04 DIAGNOSIS — Z8673 Personal history of transient ischemic attack (TIA), and cerebral infarction without residual deficits: Secondary | ICD-10-CM

## 2021-05-04 LAB — CBC WITH DIFFERENTIAL/PLATELET
Abs Immature Granulocytes: 0.02 10*3/uL (ref 0.00–0.07)
Basophils Absolute: 0 10*3/uL (ref 0.0–0.1)
Basophils Relative: 0 %
Eosinophils Absolute: 0.2 10*3/uL (ref 0.0–0.5)
Eosinophils Relative: 2 %
HCT: 28.6 % — ABNORMAL LOW (ref 39.0–52.0)
Hemoglobin: 9.3 g/dL — ABNORMAL LOW (ref 13.0–17.0)
Immature Granulocytes: 0 %
Lymphocytes Relative: 10 %
Lymphs Abs: 1 10*3/uL (ref 0.7–4.0)
MCH: 31.8 pg (ref 26.0–34.0)
MCHC: 32.5 g/dL (ref 30.0–36.0)
MCV: 97.9 fL (ref 80.0–100.0)
Monocytes Absolute: 1.1 10*3/uL — ABNORMAL HIGH (ref 0.1–1.0)
Monocytes Relative: 11 %
Neutro Abs: 7.3 10*3/uL (ref 1.7–7.7)
Neutrophils Relative %: 77 %
Platelets: 130 10*3/uL — ABNORMAL LOW (ref 150–400)
RBC: 2.92 MIL/uL — ABNORMAL LOW (ref 4.22–5.81)
RDW: 13.5 % (ref 11.5–15.5)
WBC: 9.6 10*3/uL (ref 4.0–10.5)
nRBC: 0 % (ref 0.0–0.2)

## 2021-05-04 LAB — COMPREHENSIVE METABOLIC PANEL
ALT: 15 U/L (ref 0–44)
AST: 14 U/L — ABNORMAL LOW (ref 15–41)
Albumin: 3.7 g/dL (ref 3.5–5.0)
Alkaline Phosphatase: 76 U/L (ref 38–126)
Anion gap: 16 — ABNORMAL HIGH (ref 5–15)
BUN: 64 mg/dL — ABNORMAL HIGH (ref 6–20)
CO2: 27 mmol/L (ref 22–32)
Calcium: 9.4 mg/dL (ref 8.9–10.3)
Chloride: 95 mmol/L — ABNORMAL LOW (ref 98–111)
Creatinine, Ser: 16.96 mg/dL — ABNORMAL HIGH (ref 0.61–1.24)
GFR, Estimated: 3 mL/min — ABNORMAL LOW (ref >60)
Glucose, Bld: 91 mg/dL (ref 70–99)
Potassium: 3.8 mmol/L (ref 3.5–5.1)
Sodium: 138 mmol/L (ref 135–145)
Total Bilirubin: 0.6 mg/dL (ref 0.3–1.2)
Total Protein: 7.8 g/dL (ref 6.5–8.1)

## 2021-05-04 LAB — RESP PANEL BY RT-PCR (FLU A&B, COVID) ARPGX2
Influenza A by PCR: NEGATIVE
Influenza B by PCR: NEGATIVE
SARS Coronavirus 2 by RT PCR: NEGATIVE

## 2021-05-04 LAB — BRAIN NATRIURETIC PEPTIDE: B Natriuretic Peptide: 338 pg/mL — ABNORMAL HIGH (ref 0.0–100.0)

## 2021-05-04 LAB — TROPONIN I (HIGH SENSITIVITY)
Troponin I (High Sensitivity): 86 ng/L — ABNORMAL HIGH (ref <18)
Troponin I (High Sensitivity): 86 ng/L — ABNORMAL HIGH (ref <18)

## 2021-05-04 MED ORDER — IPRATROPIUM-ALBUTEROL 0.5-2.5 (3) MG/3ML IN SOLN
3.0000 mL | Freq: Once | RESPIRATORY_TRACT | Status: AC
  Administered 2021-05-04: 19:00:00 3 mL via RESPIRATORY_TRACT
  Filled 2021-05-04: qty 3

## 2021-05-04 MED ORDER — APIXABAN 2.5 MG PO TABS
2.5000 mg | ORAL_TABLET | Freq: Two times a day (BID) | ORAL | Status: DC
  Administered 2021-05-05 – 2021-05-07 (×5): 2.5 mg via ORAL
  Filled 2021-05-04 (×5): qty 1

## 2021-05-04 MED ORDER — GABAPENTIN 100 MG PO CAPS
100.0000 mg | ORAL_CAPSULE | Freq: Every day | ORAL | Status: DC
  Administered 2021-05-04 – 2021-05-06 (×3): 100 mg via ORAL
  Filled 2021-05-04 (×3): qty 1

## 2021-05-04 MED ORDER — SODIUM CHLORIDE 0.9% FLUSH
3.0000 mL | Freq: Two times a day (BID) | INTRAVENOUS | Status: DC
  Administered 2021-05-04 – 2021-05-06 (×5): 3 mL via INTRAVENOUS

## 2021-05-04 MED ORDER — CINACALCET HCL 30 MG PO TABS
60.0000 mg | ORAL_TABLET | Freq: Every day | ORAL | Status: DC
  Administered 2021-05-06 – 2021-05-07 (×2): 60 mg via ORAL
  Filled 2021-05-04 (×2): qty 2

## 2021-05-04 MED ORDER — ATORVASTATIN CALCIUM 40 MG PO TABS
40.0000 mg | ORAL_TABLET | Freq: Every day | ORAL | Status: DC
  Administered 2021-05-05 – 2021-05-06 (×2): 40 mg via ORAL
  Filled 2021-05-04 (×2): qty 1

## 2021-05-04 MED ORDER — CARVEDILOL 6.25 MG PO TABS
6.2500 mg | ORAL_TABLET | Freq: Two times a day (BID) | ORAL | Status: DC
  Administered 2021-05-05 – 2021-05-06 (×3): 6.25 mg via ORAL
  Filled 2021-05-04 (×3): qty 2

## 2021-05-04 MED ORDER — AMLODIPINE BESYLATE 10 MG PO TABS
10.0000 mg | ORAL_TABLET | Freq: Every day | ORAL | Status: DC
  Administered 2021-05-05 – 2021-05-07 (×3): 10 mg via ORAL
  Filled 2021-05-04: qty 2
  Filled 2021-05-04: qty 1
  Filled 2021-05-04: qty 2

## 2021-05-04 MED ORDER — LAMOTRIGINE 100 MG PO TABS
200.0000 mg | ORAL_TABLET | Freq: Two times a day (BID) | ORAL | Status: DC
  Administered 2021-05-05 – 2021-05-07 (×5): 200 mg via ORAL
  Filled 2021-05-04 (×5): qty 2

## 2021-05-04 MED ORDER — ACETAMINOPHEN 325 MG PO TABS
650.0000 mg | ORAL_TABLET | Freq: Four times a day (QID) | ORAL | Status: DC | PRN
  Administered 2021-05-05 – 2021-05-07 (×2): 650 mg via ORAL
  Filled 2021-05-04 (×2): qty 2

## 2021-05-04 MED ORDER — HEPARIN SODIUM (PORCINE) 5000 UNIT/ML IJ SOLN
5000.0000 [IU] | Freq: Three times a day (TID) | INTRAMUSCULAR | Status: DC
  Filled 2021-05-04: qty 1

## 2021-05-04 MED ORDER — HYDROCOD POLST-CPM POLST ER 10-8 MG/5ML PO SUER
5.0000 mL | Freq: Once | ORAL | Status: AC
  Administered 2021-05-04: 5 mL via ORAL
  Filled 2021-05-04: qty 5

## 2021-05-04 MED ORDER — CHLORHEXIDINE GLUCONATE CLOTH 2 % EX PADS
6.0000 | MEDICATED_PAD | Freq: Every day | CUTANEOUS | Status: DC
  Administered 2021-05-06: 6 via TOPICAL

## 2021-05-04 MED ORDER — FERRIC CITRATE 1 GM 210 MG(FE) PO TABS
210.0000 mg | ORAL_TABLET | Freq: Three times a day (TID) | ORAL | Status: DC
  Administered 2021-05-05 – 2021-05-07 (×5): 210 mg via ORAL
  Filled 2021-05-04 (×5): qty 1

## 2021-05-04 MED ORDER — ACETAMINOPHEN 650 MG RE SUPP: 650.0000 mg | Freq: Four times a day (QID) | RECTAL | Status: DC | PRN

## 2021-05-04 MED ORDER — HYDROXYZINE HCL 25 MG PO TABS: 25.0000 mg | ORAL_TABLET | Freq: Three times a day (TID) | ORAL | Status: DC | PRN

## 2021-05-04 NOTE — ED Notes (Signed)
Pt ambulated on on room air. Pt's sats remained in low 80's during ambulation. Pt did not complain of discomfort during ambulation, but displays significant increase in coughing upon return to room.

## 2021-05-04 NOTE — ED Triage Notes (Signed)
Pt c/o cough x 3 weeks.  O2 sats in the 70's pt placed on 2L of O2.  Pt seen and rx 5 day course of steroid and cough syrup.  Pt stopped taking cough syrup because it made his nose bleed.  Pt is on dialysis, last one yesterday.

## 2021-05-04 NOTE — ED Notes (Signed)
Put patient on 3L [place] do to O2 Sat.

## 2021-05-04 NOTE — ED Provider Notes (Signed)
[place] DEPT Provider Note   CSN: [id number] Arrival date & time: 05/04/21  1743     History Chief Complaint  Patient presents with   Cough    [name] [name] is a 40 y.o. male.  Presented to the emergency room with chief complaint of cough.  Reports that over the last 3 weeks or so he has been having a cough, productive of mostly clear sputum.  No blood.  Did have a bloody nose a few days ago.  Went to an urgent care and was prescribed steroids.  States that symptoms have not significantly changed since then.  He does not feel particularly short of breath.  States that he has a history of ESRD, lupus but is not on any treatment.  Hypertension.  No associated chest pain.  Not on oxygen chronically, states that he requires oxygen when getting dialysis treatments.  No fevers.  HPI     Past Medical History:  Diagnosis Date   Chronic kidney disease    ESRD (end stage renal disease) ([place])    Hypertension    Lupus ([place])    Seizures ([place])    Seizures ([place])    Stroke ([place])    60 or 40 years of age.     Wears glasses     Patient Active Problem List   Diagnosis Date Noted   Long-term current use of anticonvulsant 08/04/2020   COVID-19 07/26/2020   Breakthrough seizure ([place]) 07/20/2020   Acute respiratory failure with hypoxia (HCC)    Lupus ([place])    Seizure, late effect of stroke ([place]) 08/26/2019   Secondary hyperparathyroidism of renal origin ([place]) 11/05/2018   Anemia in chronic kidney disease 09/23/2018   ESRD (end stage renal disease) ([place]) 03/18/2018   Prediabetes 01/10/2018   Chest pain in adult    CKD (chronic kidney disease) stage 3, GFR 30-59 ml/min (HCC) 10/24/2015   Seizures ([place]) 10/24/2015   HTN (hypertension) 01/12/2015   Chronic anticoagulation 04/24/2011   Lupus anticoagulant positive 04/24/2011   OBESITY, NOS 08/15/2006   THROMBOCYTOPENIA 08/15/2006   TOBACCO DEPENDENCE 08/15/2006   CVA 08/15/2006    Past Surgical  History:  Procedure Laterality Date   AV FISTULA PLACEMENT Left 12/30/2017   Procedure: ARTERIOVENOUS (AV) FISTULA CREATION VERSUS INSERTION OF ARTERIOVENOUS GRAFT LEFT ARM;  Surgeon: Rosetta Posner, MD;  Location: [facility];  Service: Vascular;  Laterality: Left;   [place] Left 08/15/2018   Procedure: LEFT FIRST STAGE [place];  Surgeon: Rosetta Posner, MD;  Location: [facility];  Service: Vascular;  Laterality: Left;   [place] Left 10/30/2018   Procedure: INSERTION OF GORE-TEX GRAFT LEFT UPPER ARM;  Surgeon: Waynetta Sandy, MD;  Location: [facility];  Service: Vascular;  Laterality: Left;   INSERTION OF DIALYSIS CATHETER N/A 09/20/2018   Procedure: INSERTION OF TUNNELED DIALYSIS CATHETER RIGHT INTERNAL JUGULAR;  Surgeon: Marty Heck, MD;  [facility];  Service: Vascular;  Laterality: N/A;   RENAL BIOPSY         Family History  Problem Relation Age of Onset   Diabetes Mother    Diabetes Maternal Grandmother    Seizures Neg Hx        not on mother's side. he doesn't know about his father's side    Social History   Tobacco Use   Smoking status: Former    Packs/day: 0.25    Types: Cigarettes    Quit date: 12/21/2017  Years since quitting: 3.3   Smokeless tobacco: Never  Vaping Use   Vaping Use: Never used  Substance Use Topics   Alcohol use: Not Currently   Drug use: No    Home Medications Prior to Admission medications   Medication Sig Start Date End Date Taking? Authorizing Provider  amLODipine (NORVASC) 10 MG tablet TAKE 1 TABLET BY MOUTH AT BEDTIME 09/27/20 09/27/21  Gildardo Pounds, NP  apixaban (ELIQUIS) 2.5 MG TABS tablet Take 1 tablet (2.5 mg total) by mouth 2 (two) times daily. 09/27/20 12/26/20  Gildardo Pounds, NP  atorvastatin (LIPITOR) 40 MG tablet Take 1 tablet (40 mg total) by mouth daily. 09/29/20   Gildardo Pounds, NP  carvedilol (COREG) 6.25 MG tablet TAKE 1 TABLET (6.25 MG TOTAL) BY MOUTH TWO  TIMES DAILY WITH A MEAL. 04/15/21 04/15/22  Gildardo Pounds, NP  ferric citrate (AURYXIA) 1 GM 210 MG(Fe) tablet Take 4 tablets by mouth 3 times a day with meals and 2 with snacks. Each tablet is 210 mg. 09/27/20   Gildardo Pounds, NP  gabapentin (NEURONTIN) 100 MG capsule Take 100 mg by mouth at bedtime. 12/25/20   [provider]  gabapentin (NEURONTIN) 600 MG tablet Take 0.5 tablets (300 mg total) by mouth at bedtime. 09/27/20 12/26/20  Gildardo Pounds, NP  hydrOXYzine (ATARAX/VISTARIL) 25 MG tablet Take 25 mg by mouth 3 (three) times daily as needed. 11/17/20   [provider]  lamoTRIgine (LAMICTAL) 200 MG tablet TAKE 1 TABLET (200 MG TOTAL) BY MOUTH 2 (TWO) TIMES DAILY. 08/04/20 08/04/21  Melvenia Beam, MD  lamoTRIgine (LAMICTAL) 25 MG tablet Take 25 mg by mouth daily. Take medication on Monday, Wednesday, and Friday Patient not taking: Reported on 01/24/2021    [provider]  silver sulfADIAZINE (SILVADENE) 1 % cream Apply to affected skin areas once per day for 10 days or until area is healed Patient not taking: No sig reported 11/24/18   Antony Blackbird, MD    Allergies    Zyrtec [cetirizine] and Zyrtec [cetirizine]  Review of Systems   Review of Systems  Constitutional:  Positive for fatigue. Negative for chills and fever.  HENT:  Positive for nosebleeds. Negative for ear pain and sore throat.   Eyes:  Negative for pain and visual disturbance.  Respiratory:  Positive for cough and shortness of breath.   Cardiovascular:  Negative for chest pain and palpitations.  Gastrointestinal:  Negative for abdominal pain and vomiting.  Genitourinary:  Negative for dysuria and hematuria.  Musculoskeletal:  Negative for arthralgias and back pain.  Skin:  Negative for color change and rash.  Neurological:  Negative for seizures and syncope.  All other systems reviewed and are negative.  Physical Exam Updated Vital Signs BP (!) 152/131   Pulse 86   Temp 98.8 F (37.1 C)  (Oral)   Resp (!) 29   SpO2 92%   Physical Exam Vitals and nursing note reviewed.  Constitutional:      General: He is not in acute distress.    Appearance: He is well-developed.  HENT:     Head: Normocephalic and atraumatic.  Eyes:     Conjunctiva/sclera: Conjunctivae normal.  Cardiovascular:     Rate and Rhythm: Normal rate.     Pulses: Normal pulses.  Pulmonary:     Effort: Pulmonary effort is normal. No respiratory distress.     Comments: Mild wheeze bilaterally, no tachypnea or increased work of breathing Abdominal:     Palpations:  Abdomen is soft.     Tenderness: There is no abdominal tenderness.  Musculoskeletal:        General: No swelling.     Cervical back: Neck supple.  Skin:    General: Skin is warm and dry.     Capillary Refill: Capillary refill takes less than 2 seconds.  Neurological:     Mental Status: He is alert.  Psychiatric:        Mood and Affect: Mood normal.    ED Results / Procedures / Treatments   Labs (all labs ordered are listed, but only abnormal results are displayed) Labs Reviewed  CBC WITH DIFFERENTIAL/PLATELET - Abnormal; Notable for the following components:      Result Value   RBC 2.92 (*)    Hemoglobin 9.3 (*)    HCT 28.6 (*)    Platelets 130 (*)    Monocytes Absolute 1.1 (*)    All other components within normal limits  COMPREHENSIVE METABOLIC PANEL - Abnormal; Notable for the following components:   Chloride 95 (*)    BUN 64 (*)    Creatinine, Ser 16.96 (*)    AST 14 (*)    GFR, Estimated 3 (*)    Anion gap 16 (*)    All other components within normal limits  BRAIN NATRIURETIC PEPTIDE - Abnormal; Notable for the following components:   B Natriuretic Peptide 338.0 (*)    All other components within normal limits  TROPONIN I (HIGH SENSITIVITY) - Abnormal; Notable for the following components:   Troponin I (High Sensitivity) 86 (*)    All other components within normal limits  TROPONIN I (HIGH SENSITIVITY) - Abnormal;  Notable for the following components:   Troponin I (High Sensitivity) 86 (*)    All other components within normal limits  RESP PANEL BY RT-PCR (FLU A&B, COVID) [id number]    EKG EKG Interpretation  Date/Time:  Thursday May 04 2021 18:26:28 EST Ventricular Rate:  98 PR Interval:  175 QRS Duration: 104 QT Interval:  365 QTC Calculation: 466 R Axis:   14 Text Interpretation: Sinus rhythm Probable left atrial enlargement RSR' in V1 or V2, right VCD or RVH Confirmed by Madalyn Rob [phone]) on 05/04/2021 7:11:42 PM  Radiology DG Chest Portable 1 View  Result Date: 05/04/2021 CLINICAL DATA:  Cough and hypoxia EXAM: PORTABLE CHEST 1 VIEW COMPARISON:  07/23/2020 FINDINGS: Cardiac shadow is enlarged but stable. Increased vascular congestion is noted with mild interstitial edema. No focal infiltrate is noted. IMPRESSION: Changes of mild CHF. Electronically Signed   By: Inez Catalina M.D.   On: 05/04/2021 19:08    Procedures .Critical Care Performed by: Lucrezia Starch, MD Authorized by: Lucrezia Starch, MD   Critical care provider statement:    Critical care time (minutes):  36   Critical care was necessary to treat or prevent imminent or life-threatening deterioration of the following conditions:  Respiratory failure   Critical care was time spent personally by me on the following activities:  Development of treatment plan with patient or surrogate, discussions with consultants, evaluation of patient's response to treatment, examination of patient, ordering and review of laboratory studies, ordering and review of radiographic studies, ordering and performing treatments and interventions, pulse oximetry, re-evaluation of patient's condition and review of old charts   Medications Ordered in ED Medications  ipratropium-albuterol (DUONEB) 0.5-2.5 (3) MG/3ML nebulizer solution 3 mL (3 mLs Nebulization Given 05/04/21 1853)  chlorpheniramine-HYDROcodone (TUSSIONEX) 10-8 MG/5ML  suspension 5 mL (5 mLs Oral Given  05/04/21 1853)    ED Course  I have reviewed the triage vital signs and the nursing notes.  Pertinent labs & imaging results that were available during my care of the patient were reviewed by me and considered in my medical decision making (see chart for details).    MDM Rules/Calculators/A&P                           40 year old ESRD presents to ER with concern for cough.  Patient was noted to be hypoxic, otherwise peers well.  Noted slight wheeze initially.  Patient was provided breathing treatment, checked chest x-ray, EKG, labs.  EKG without acute ischemic change, troponin was mildly elevated but repeat was the same.  Chest x-ray with increased vascular congestion, mild interstitial edema.  Suspect that this may be the cause for his symptoms.  No pneumonia.  On reassessment patient states that he feels fine but his oxygen on room air dropped into the low 80s again.  Due to this persistent hypoxia, believe he would benefit from admission for further work-up.  Discussed case with nephrology, she will add to their list, recommends admission at [facility].  Discussed case with the hospitalist service, they will admit to [facility].  Potentially of patient's symptoms have improved after getting dialysis then he may not need extensive further medical testing.  However other causes may need to be pursued and work-up such as echocardiogram, VQ scan, etc. may need to be performed.  Per rheumatology note in August patient has lupus anticoagulant positive test but did not have confirmatory testing, chronically anticoagulated.  Will defer to admitting hospitalist regarding additional medical work-up.  Final Clinical Impression(s) / ED Diagnoses Final diagnoses:  Cough, unspecified type  ESRD (end stage renal disease) ([place])  Hypoxia    Rx / DC Orders ED Discharge Orders     None        Lucrezia Starch, MD 05/04/21 2232

## 2021-05-04 NOTE — ED Notes (Addendum)
EKG performed by [facility] EMT

## 2021-05-04 NOTE — H&P (Addendum)
History and Physical   GLENDA KUNST GGY:[id number] DOB: [birth year] DOA: 05/04/2021  PCP: Gildardo Pounds, NP   Patient coming from: Home  Chief Complaint: Cough, shortness of breath  HPI: [name] [name] is a 40 y.o. male with medical history significant of anemia of chronic kidney disease, ESRD on HD MWF, hypertension, stroke, seizure, lupus, thrombocytopenia who presents with ongoing cough and shortness of breath.  Patient states he has had a cough for the last few weeks with clear sputum and no blood in the sputum.  He has not had much associated shortness of breath.  He was evaluated at urgent care and completed a course of steroids for 5 days and was on a cough syrup as well.  He states this does not seem to help very much and he felt the cough syrup may contribute to him getting nosebleeds we stopped taking it.  No stents.  He states he does not take any chronic oxygen but does get it during his dialysis sessions.  He states that he has prescribed Eliquis but ran out a couple weeks ago.  He denies fevers, chills, chest pain, abdominal pain, constipation, diarrhea, nausea, vomiting.  ED Course: Vital signs in the ED significant for respiratory rate in the 20s requiring 2 L to maintain saturations.  Heart rate in the 80s to 100s, blood pressure in the 035K 093G systolic.  Lab work-up showed CMP with chloride 95, normal bicarb and gap of 16 with BUN of 64 and creatinine 16.96 consistent with ESRD.  CBC with platelets of 130 up from previous of 40s, hemoglobin 9.3 down from previous of 12.  Troponin flat at 86 and then 86 again on repeat.  BNP mildly elevated 338.  Respiratory panel flu and COVID-negative.  Patient received duo nebs and Tussionex in the ED.  Nephrology was consulted and will see the patient at [facility] for dialysis.  Review of Systems: As per HPI otherwise all other systems reviewed and are negative.  Past Medical History:  Diagnosis Date   Chronic kidney disease     ESRD (end stage renal disease) ([place])    Hypertension    Lupus ([place])    Seizures ([place])    Seizures ([place])    Stroke ([place])    51 or 40 years of age.     Wears glasses     Past Surgical History:  Procedure Laterality Date   AV FISTULA PLACEMENT Left 12/30/2017   Procedure: ARTERIOVENOUS (AV) FISTULA CREATION VERSUS INSERTION OF ARTERIOVENOUS GRAFT LEFT ARM;  Surgeon: Rosetta Posner, MD;  Location: [place];  Service: Vascular;  Laterality: Left;   [place] Left 08/15/2018   Procedure: LEFT FIRST STAGE [place];  Surgeon: Rosetta Posner, MD;  Location: [place];  Service: Vascular;  Laterality: Left;   [place] Left 10/30/2018   Procedure: INSERTION OF GORE-TEX GRAFT LEFT UPPER ARM;  Surgeon: Waynetta Sandy, MD;  Location: [place];  Service: Vascular;  Laterality: Left;   INSERTION OF DIALYSIS CATHETER N/A 09/20/2018   Procedure: INSERTION OF TUNNELED DIALYSIS CATHETER RIGHT INTERNAL JUGULAR;  Surgeon: Marty Heck, MD;  Location: [place];  Service: Vascular;  Laterality: N/A;   RENAL BIOPSY      Social History  reports that he quit smoking about 3 years ago. His smoking use included cigarettes. He smoked an average of .25 packs per day. He has never used smokeless tobacco. He reports that he does not currently use alcohol.  He reports that he does not use drugs.  Allergies  Allergen Reactions   Zyrtec [Cetirizine] Swelling    FACIAL SWELLING, NOT CATEGORIZED    Family History  Problem Relation Age of Onset   Diabetes Mother    Diabetes Maternal Grandmother    Seizures Neg Hx        not on mother's side. he doesn't know about his father's side  Reviewed on admission  Prior to Admission medications   Medication Sig Start Date End Date Taking? Authorizing Provider  amLODipine (NORVASC) 10 MG tablet TAKE 1 TABLET BY MOUTH AT BEDTIME 09/27/20 09/27/21  Gildardo Pounds, NP  apixaban (ELIQUIS) 2.5 MG TABS tablet Take 1 tablet  (2.5 mg total) by mouth 2 (two) times daily. 09/27/20 12/26/20  Gildardo Pounds, NP  atorvastatin (LIPITOR) 40 MG tablet Take 1 tablet (40 mg total) by mouth daily. 09/29/20   Gildardo Pounds, NP  carvedilol (COREG) 6.25 MG tablet TAKE 1 TABLET (6.25 MG TOTAL) BY MOUTH TWO TIMES DAILY WITH A MEAL. 04/15/21 04/15/22  Gildardo Pounds, NP  ferric citrate (AURYXIA) 1 GM 210 MG(Fe) tablet Take 4 tablets by mouth 3 times a day with meals and 2 with snacks. Each tablet is 210 mg. 09/27/20   Gildardo Pounds, NP  gabapentin (NEURONTIN) 100 MG capsule Take 100 mg by mouth at bedtime. 12/25/20   [provider]  gabapentin (NEURONTIN) 600 MG tablet Take 0.5 tablets (300 mg total) by mouth at bedtime. 09/27/20 12/26/20  Gildardo Pounds, NP  hydrOXYzine (ATARAX/VISTARIL) 25 MG tablet Take 25 mg by mouth 3 (three) times daily as needed. 11/17/20   [provider]  lamoTRIgine (LAMICTAL) 200 MG tablet TAKE 1 TABLET (200 MG TOTAL) BY MOUTH 2 (TWO) TIMES DAILY. 08/04/20 08/04/21  Melvenia Beam, MD  lamoTRIgine (LAMICTAL) 25 MG tablet Take 25 mg by mouth daily. Take medication on Monday, Wednesday, and Friday Patient not taking: Reported on 01/24/2021    [provider]  silver sulfADIAZINE (SILVADENE) 1 % cream Apply to affected skin areas once per day for 10 days or until area is healed Patient not taking: No sig reported 11/24/18   Antony Blackbird, MD    Physical Exam: Vitals:   05/04/21 2230 05/04/21 2233 05/04/21 2234 05/04/21 2235  BP:  (!) 184/117    Pulse: 87 87 89 85  Resp: 13 18 15 10   Temp:      TempSrc:      SpO2: (!) 78% (!) 83% 90% 92%   Physical Exam Constitutional:      General: He is not in acute distress.    Appearance: Normal appearance.  HENT:     Head: Normocephalic and atraumatic.     Mouth/Throat:     Mouth: Mucous membranes are moist.     Pharynx: Oropharynx is clear.  Eyes:     Extraocular Movements: Extraocular movements intact.     Pupils: Pupils are  equal, round, and reactive to light.  Cardiovascular:     Rate and Rhythm: Normal rate and regular rhythm.     Pulses: Normal pulses.     Heart sounds: Normal heart sounds.  Pulmonary:     Effort: Pulmonary effort is normal. No respiratory distress.     Breath sounds: Rales present.     Comments: Tachypnea Abdominal:     General: Bowel sounds are normal. There is no distension.     Palpations: Abdomen is soft.     Tenderness: There is  no abdominal tenderness.  Musculoskeletal:        General: No swelling or deformity.     Right lower leg: Edema present.     Left lower leg: Edema present.  Skin:    General: Skin is warm and dry.  Neurological:     General: No focal deficit present.     Mental Status: Mental status is at baseline.   Labs on Admission: I have personally reviewed following labs and imaging studies  CBC: Recent Labs  Lab 05/04/21 1851  WBC 9.6  NEUTROABS 7.3  HGB 9.3*  HCT 28.6*  MCV 97.9  PLT 130*    Basic Metabolic Panel: Recent Labs  Lab 05/04/21 1851  NA 138  K 3.8  CL 95*  CO2 27  GLUCOSE 91  BUN 64*  CREATININE 16.96*  CALCIUM 9.4    GFR: CrCl cannot be calculated (Unknown ideal weight.).  Liver Function Tests: Recent Labs  Lab 05/04/21 1851  AST 14*  ALT 15  ALKPHOS 76  BILITOT 0.6  PROT 7.8  ALBUMIN 3.7    Urine analysis:    Component Value Date/Time   COLORURINE STRAW (A) 12/21/2017 0344   APPEARANCEUR CLEAR 12/21/2017 0344   LABSPEC 1.009 12/21/2017 0344   PHURINE 6.0 12/21/2017 0344   GLUCOSEU NEGATIVE 12/21/2017 0344   HGBUR SMALL (A) 12/21/2017 0344   BILIRUBINUR NEGATIVE 12/21/2017 [place] 12/21/2017 0344   PROTEINUR 100 (A) 12/21/2017 0344   UROBILINOGEN 0.2 05/09/2013 1857   NITRITE NEGATIVE 12/21/2017 0344   LEUKOCYTESUR NEGATIVE 12/21/2017 0344    Radiological Exams on Admission: DG Chest Portable 1 View  Result Date: 05/04/2021 CLINICAL DATA:  Cough and hypoxia EXAM: PORTABLE CHEST  1 VIEW COMPARISON:  07/23/2020 FINDINGS: Cardiac shadow is enlarged but stable. Increased vascular congestion is noted with mild interstitial edema. No focal infiltrate is noted. IMPRESSION: Changes of mild CHF. Electronically Signed   By: Inez Catalina M.D.   On: 05/04/2021 19:08    EKG: Independently reviewed.  Sinus rhythm at 98 bpm.  RSR prime in V1 and V2.  Intraventricular conduction delay.  Assessment/Plan Principal Problem:   Acute respiratory failure with hypoxia (HCC)  Acute hypoxic respiratory failure > Presented with ongoing cough for last few weeks.  Did not respond to a course of steroids and cough syrup at urgent care. > BNP mildly elevated 338, troponin flat at 86, evidence of CHF's/volume overload on chest x-ray. > His cough and respiratory failure may be due to volume overload possibly related to change in dry weight. > Exam consistent with volume overload with rales and edema.  Lower on the differential is possible pulmonary embolism however no significant tachycardia thus far in no symptomatic respiratory failure, however he did run out of his Eliquis a few weeks ago. > Nephrology consulted as below  - Monitor on telemetry at [facility] for dialysis needs - We will check echocardiogram, last echo was in 2018 with EF 60-65% - Fluid management with HD tomorrow  - Check viral panel   ESRD on HD > Last session was yesterday will need dialysis tomorrow. > Appears volume overloaded causing respiratory failure and cough as above > Nephrology consulted in the ED - Appreciate nephrology recommendations - Transfer to [facility] for dialysis needs - Continue home Sensipar and Auryxia  Hypertension - Continue home carvedilol and amlodipine  History of stroke - Continue home Eliquis and atorvastatin  Seizure - Continue home Lamictal  Thrombocytopenia > Platelets improved from  previous at 130 from 72s a few months ago. - Continue to monitor  Anemia > In the setting of  ESRD > Hemoglobin 9.3 down from 12 3 months ago however this is in the setting of volume overload - Continue to trend for now  Lupus - Follows with rheumatology   DVT prophylaxis: Heparin  Code Status:   Full Family Communication:  None on admission, patient states he will contact his family. Disposition Plan:   Patient is from:  Home  Anticipated DC to:  Home  Anticipated DC date:  1 to 2 days  Anticipated DC barriers: None  Consults called:  Nephrology, consulted by EDP.   Admission status:  Observation, telemetry  Severity of Illness: The appropriate patient status for this patient is OBSERVATION. Observation status is judged to be reasonable and necessary in order to provide the required intensity of service to ensure the patient's safety. The patient's presenting symptoms, physical exam findings, and initial radiographic and laboratory data in the context of their medical condition is felt to place them at decreased risk for further clinical deterioration. Furthermore, it is anticipated that the patient will be medically stable for discharge from the hospital within 2 midnights of admission.    Marcelyn Bruins MD [facility]  How to contact the [facility] Attending or Consulting provider [place] or covering provider during after hours [place], for this patient?   Check the care team in [facility] and look for a) attending/consulting [facility] provider listed and b) the [facility] team listed Log into [email or web address] and use [place]'s universal password to access. If you do not have the password, please contact the hospital operator. Locate the [facility] provider you are looking for under [facility] and page to a number that you can be directly reached. If you still have difficulty reaching the provider, please page the [facility] (Director on Call) for the Hospitalists listed on amion for assistance.  05/04/2021, 11:52 PM

## 2021-05-05 ENCOUNTER — Other Ambulatory Visit (HOSPITAL_COMMUNITY): Payer: No Typology Code available for payment source

## 2021-05-05 ENCOUNTER — Observation Stay (HOSPITAL_COMMUNITY): Payer: No Typology Code available for payment source

## 2021-05-05 DIAGNOSIS — Z7901 Long term (current) use of anticoagulants: Secondary | ICD-10-CM | POA: Diagnosis not present

## 2021-05-05 DIAGNOSIS — N2581 Secondary hyperparathyroidism of renal origin: Secondary | ICD-10-CM | POA: Diagnosis present

## 2021-05-05 DIAGNOSIS — I509 Heart failure, unspecified: Secondary | ICD-10-CM | POA: Diagnosis present

## 2021-05-05 DIAGNOSIS — R0609 Other forms of dyspnea: Secondary | ICD-10-CM

## 2021-05-05 DIAGNOSIS — D6862 Lupus anticoagulant syndrome: Secondary | ICD-10-CM | POA: Diagnosis present

## 2021-05-05 DIAGNOSIS — Z86711 Personal history of pulmonary embolism: Secondary | ICD-10-CM | POA: Diagnosis not present

## 2021-05-05 DIAGNOSIS — D631 Anemia in chronic kidney disease: Secondary | ICD-10-CM | POA: Diagnosis present

## 2021-05-05 DIAGNOSIS — M898X9 Other specified disorders of bone, unspecified site: Secondary | ICD-10-CM | POA: Diagnosis present

## 2021-05-05 DIAGNOSIS — I132 Hypertensive heart and chronic kidney disease with heart failure and with stage 5 chronic kidney disease, or end stage renal disease: Secondary | ICD-10-CM | POA: Diagnosis present

## 2021-05-05 DIAGNOSIS — N186 End stage renal disease: Secondary | ICD-10-CM | POA: Diagnosis present

## 2021-05-05 DIAGNOSIS — R760 Raised antibody titer: Secondary | ICD-10-CM | POA: Diagnosis not present

## 2021-05-05 DIAGNOSIS — E669 Obesity, unspecified: Secondary | ICD-10-CM | POA: Diagnosis present

## 2021-05-05 DIAGNOSIS — G40909 Epilepsy, unspecified, not intractable, without status epilepticus: Secondary | ICD-10-CM | POA: Diagnosis present

## 2021-05-05 DIAGNOSIS — I10 Essential (primary) hypertension: Secondary | ICD-10-CM | POA: Diagnosis not present

## 2021-05-05 DIAGNOSIS — Z992 Dependence on renal dialysis: Secondary | ICD-10-CM | POA: Diagnosis not present

## 2021-05-05 DIAGNOSIS — Z20822 Contact with and (suspected) exposure to covid-19: Secondary | ICD-10-CM | POA: Diagnosis present

## 2021-05-05 DIAGNOSIS — Z6836 Body mass index (BMI) 36.0-36.9, adult: Secondary | ICD-10-CM | POA: Diagnosis not present

## 2021-05-05 DIAGNOSIS — R0902 Hypoxemia: Secondary | ICD-10-CM | POA: Diagnosis present

## 2021-05-05 DIAGNOSIS — Z86718 Personal history of other venous thrombosis and embolism: Secondary | ICD-10-CM | POA: Diagnosis not present

## 2021-05-05 DIAGNOSIS — Z833 Family history of diabetes mellitus: Secondary | ICD-10-CM | POA: Diagnosis not present

## 2021-05-05 DIAGNOSIS — J9601 Acute respiratory failure with hypoxia: Secondary | ICD-10-CM | POA: Diagnosis present

## 2021-05-05 DIAGNOSIS — D696 Thrombocytopenia, unspecified: Secondary | ICD-10-CM | POA: Diagnosis present

## 2021-05-05 LAB — BASIC METABOLIC PANEL
Anion gap: 15 (ref 5–15)
BUN: 69 mg/dL — ABNORMAL HIGH (ref 6–20)
CO2: 27 mmol/L (ref 22–32)
Calcium: 9.1 mg/dL (ref 8.9–10.3)
Chloride: 94 mmol/L — ABNORMAL LOW (ref 98–111)
Creatinine, Ser: 18.25 mg/dL — ABNORMAL HIGH (ref 0.61–1.24)
GFR, Estimated: 3 mL/min — ABNORMAL LOW (ref >60)
Glucose, Bld: 93 mg/dL (ref 70–99)
Potassium: 4 mmol/L (ref 3.5–5.1)
Sodium: 136 mmol/L (ref 135–145)

## 2021-05-05 LAB — ECHOCARDIOGRAM COMPLETE
AR max vel: 3.03 cm2
AV Area VTI: 3.4 cm2
AV Area mean vel: 3.31 cm2
AV Mean grad: 10 mmHg
AV Peak grad: 17.1 mmHg
Ao pk vel: 2.07 m/s
Area-P 1/2: 3.1 cm2
MV VTI: 2.63 cm2
S' Lateral: 3.3 cm
Weight: 4342.18 oz

## 2021-05-05 LAB — CBC
HCT: 25.8 % — ABNORMAL LOW (ref 39.0–52.0)
Hemoglobin: 8.3 g/dL — ABNORMAL LOW (ref 13.0–17.0)
MCH: 31.7 pg (ref 26.0–34.0)
MCHC: 32.2 g/dL (ref 30.0–36.0)
MCV: 98.5 fL (ref 80.0–100.0)
Platelets: 131 10*3/uL — ABNORMAL LOW (ref 150–400)
RBC: 2.62 MIL/uL — ABNORMAL LOW (ref 4.22–5.81)
RDW: 13.4 % (ref 11.5–15.5)
WBC: 7.9 10*3/uL (ref 4.0–10.5)
nRBC: 0 % (ref 0.0–0.2)

## 2021-05-05 LAB — HEPATITIS B SURFACE ANTIBODY,QUALITATIVE: Hep B S Ab: REACTIVE — AB

## 2021-05-05 LAB — HEPATITIS B SURFACE ANTIGEN: Hepatitis B Surface Ag: NONREACTIVE

## 2021-05-05 MED ORDER — ALBUTEROL SULFATE (2.5 MG/3ML) 0.083% IN NEBU: 2.5000 mg | INHALATION_SOLUTION | RESPIRATORY_TRACT | Status: DC | PRN

## 2021-05-05 MED ORDER — ALTEPLASE 2 MG IJ SOLR: 2.0000 mg | Freq: Once | INTRAMUSCULAR | Status: DC | PRN

## 2021-05-05 MED ORDER — DARBEPOETIN ALFA 60 MCG/0.3ML IJ SOSY
60.0000 ug | PREFILLED_SYRINGE | INTRAMUSCULAR | Status: DC
  Administered 2021-05-05: 60 ug via INTRAVENOUS
  Filled 2021-05-05: qty 0.3

## 2021-05-05 MED ORDER — BENZONATATE 100 MG PO CAPS
200.0000 mg | ORAL_CAPSULE | Freq: Three times a day (TID) | ORAL | Status: DC
  Administered 2021-05-05 – 2021-05-07 (×6): 200 mg via ORAL
  Filled 2021-05-05 (×6): qty 2

## 2021-05-05 MED ORDER — HYDRALAZINE HCL 20 MG/ML IJ SOLN: 10.0000 mg | Freq: Three times a day (TID) | INTRAMUSCULAR | Status: DC | PRN

## 2021-05-05 MED ORDER — HEPARIN SODIUM (PORCINE) 1000 UNIT/ML DIALYSIS
20.0000 [IU]/kg | INTRAMUSCULAR | Status: DC | PRN
  Administered 2021-05-05: 2500 [IU] via INTRAVENOUS_CENTRAL
  Filled 2021-05-05: qty 3

## 2021-05-05 MED ORDER — SODIUM CHLORIDE 0.9 % IV SOLN: 100.0000 mL | INTRAVENOUS | Status: DC | PRN

## 2021-05-05 MED ORDER — LIDOCAINE HCL (PF) 1 % IJ SOLN: 5.0000 mL | INTRAMUSCULAR | Status: DC | PRN

## 2021-05-05 MED ORDER — PENTAFLUOROPROP-TETRAFLUOROETH EX AERO: 1.0000 "application " | INHALATION_SPRAY | CUTANEOUS | Status: DC | PRN

## 2021-05-05 MED ORDER — HYDRALAZINE HCL 20 MG/ML IJ SOLN
10.0000 mg | Freq: Once | INTRAMUSCULAR | Status: AC
Start: 2021-05-05 — End: 2021-05-05
  Administered 2021-05-05: 10 mg via INTRAVENOUS
  Filled 2021-05-05: qty 1

## 2021-05-05 MED ORDER — BENZONATATE 100 MG PO CAPS
200.0000 mg | ORAL_CAPSULE | Freq: Two times a day (BID) | ORAL | Status: DC | PRN
  Administered 2021-05-05: 200 mg via ORAL
  Filled 2021-05-05 (×2): qty 2

## 2021-05-05 MED ORDER — SODIUM CHLORIDE 0.9 % IV SOLN
1.0000 g | INTRAVENOUS | Status: DC
  Administered 2021-05-05 – 2021-05-06 (×2): 1 g via INTRAVENOUS
  Filled 2021-05-05 (×3): qty 1

## 2021-05-05 MED ORDER — LIDOCAINE-PRILOCAINE 2.5-2.5 % EX CREA: 1.0000 "application " | TOPICAL_CREAM | CUTANEOUS | Status: DC | PRN

## 2021-05-05 MED ORDER — VANCOMYCIN HCL 2000 MG/400ML IV SOLN
2000.0000 mg | Freq: Once | INTRAVENOUS | Status: AC
  Administered 2021-05-05: 2000 mg via INTRAVENOUS
  Filled 2021-05-05: qty 400

## 2021-05-05 MED ORDER — HYDROCOD POLST-CPM POLST ER 10-8 MG/5ML PO SUER: 5.0000 mL | Freq: Two times a day (BID) | ORAL | Status: DC | PRN

## 2021-05-05 MED ORDER — VANCOMYCIN HCL 1000 MG/200ML IV SOLN
1000.0000 mg | INTRAVENOUS | Status: DC
Start: 2021-05-08 — End: 2021-05-07

## 2021-05-05 MED ORDER — IPRATROPIUM-ALBUTEROL 0.5-2.5 (3) MG/3ML IN SOLN
3.0000 mL | Freq: Three times a day (TID) | RESPIRATORY_TRACT | Status: DC
  Administered 2021-05-05 – 2021-05-06 (×4): 3 mL via RESPIRATORY_TRACT
  Filled 2021-05-05 (×4): qty 3

## 2021-05-05 MED ORDER — HEPARIN SODIUM (PORCINE) 1000 UNIT/ML DIALYSIS: 1000.0000 [IU] | INTRAMUSCULAR | Status: DC | PRN

## 2021-05-05 NOTE — Progress Notes (Signed)
PROGRESS NOTE        PATIENT DETAILS Name: [name] [name] Age: 40 y.o. Sex: male Date of Birth: 1981-01-11 Admit Date: 05/04/2021 Admitting Physician Marcelyn Bruins, MD [id number], Vernia Buff, NP  Brief Narrative: Patient is a 40 y.o. male with history of ESRD on HD MWF, lupus, HTN, VTE on Eliquis (noncompliant-last dose sometime last month)-brought to the ED with cough/shortness of breath x3 weeks (completed course of steroids/antitussive medications as outpatient)-initially O2 saturation in the 70s 80s on room air-chest x-ray with possible pulmonary edema-subsequently admitted to the hospitalist service.  See below for further details.  Subjective: Seen in hemodialysis-still coughing-on 5 L of oxygen.  Objective: Vitals: Blood pressure (!) 183/103, pulse 89, temperature 98.6 F (37 C), temperature source Oral, resp. rate (!) 36, weight 123.1 kg, SpO2 92 %.   Exam: Gen Exam:Alert awake-not in any distress HEENT:atraumatic, normocephalic Chest: Scattered wheezing-bibasilar rales. CVS:S1S2 regular Abdomen:soft non tender, non distended Extremities:++ edema Neurology: Non focal Skin: no rash   Pertinent Labs/Radiology: Recent Labs  Lab 05/04/21 1851 05/05/21 0428  WBC 9.6 7.9  HGB 9.3* 8.3*  PLT 130* 131*  NA 138 136  K 3.8 4.0  CREATININE 16.96* 18.25*  AST 14*  --   ALT 15  --   ALKPHOS 76  --   BILITOT 0.6  --    11/17>> COVID/influenza PCR: Negative  7/17>>CXR: Changes consistent with pulm edema.   Assessment/Plan: Acute hypoxic respiratory failure-likely due to pulm edema/volume overload in the setting of hemodialysis: Requiring 5 L of oxygen with hemodialysis-plan is to continue supportive care-and see if volume removal with HD will improve his oxygenation/symptoms.  He also has not been compliant with anticoagulation-if he continues to be hypoxic post hemodialysis-he may need repeat imaging/CT angio.  ESRD on HD: Per nephrology  he is 3 kg above his dry weight.  Will defer HD to nephrology.  HTN: BP stable-continue Coreg and amlodipine   History of VTE: On compliant with Eliquis as he ran out off it sometime last month-Eliquis has been resumed.  History of CVA: Nonfocal-Eliquis/statin  Seizure disorder: Continue Lamictal  Thrombocytopenia: Mild-stable for follow-up  Normocytic anemia: Due to ESRD/CKD-defer Aranesp/IV iron to nephrology  Lupus: Defer further to outpatient rheumatology-this appears to be stable  Obesity Estimated body mass index is 36.81 kg/m as calculated from the following:   Height as of 01/24/21: 6' (1.829 m).   Weight as of this encounter: 123.1 kg.    Procedures: None Consults: Renal DVT Prophylaxis: Eliquis Code Status:Full code  Family Communication: None at bedside  Time spent: 25- minutes-Greater than 50% of this time was spent in counseling, explanation of diagnosis, planning of further management, and coordination of care.   Disposition Plan: Status is: Observation  The patient remains OBS appropriate and will d/c before 2 midnights.   Diet: Diet Order             Diet renal with fluid restriction Fluid restriction: 1200 mL Fluid; Room service appropriate? Yes; Fluid consistency: Thin  Diet effective now                     Antimicrobial agents: Anti-infectives (From admission, onward)    None        MEDICATIONS: Scheduled Meds:  amLODipine  10 mg Oral Daily   apixaban  2.5 mg Oral BID  atorvastatin  40 mg Oral Daily   carvedilol  6.25 mg Oral BID WC   Chlorhexidine Gluconate Cloth  6 each Topical Q0600   cinacalcet  60 mg Oral Daily   darbepoetin (ARANESP) injection - DIALYSIS  60 mcg Intravenous Q Fri-HD   ferric citrate  210 mg Oral TID WC   gabapentin  100 mg Oral QHS   lamoTRIgine  200 mg Oral BID   sodium chloride flush  3 mL Intravenous Q12H   Continuous Infusions:  sodium chloride     sodium chloride     PRN Meds:.sodium  chloride, sodium chloride, acetaminophen **OR** acetaminophen, alteplase, benzonatate, heparin, heparin, hydrOXYzine, lidocaine (PF), lidocaine-prilocaine, pentafluoroprop-tetrafluoroeth   I have personally reviewed following labs and imaging studies  LABORATORY DATA: CBC: Recent Labs  Lab 05/04/21 1851 05/05/21 0428  WBC 9.6 7.9  NEUTROABS 7.3  --   HGB 9.3* 8.3*  HCT 28.6* 25.8*  MCV 97.9 98.5  PLT 130* 131*    Basic Metabolic Panel: Recent Labs  Lab 05/04/21 1851 05/05/21 0428  NA 138 136  K 3.8 4.0  CL 95* 94*  CO2 27 27  GLUCOSE 91 93  BUN 64* 69*  CREATININE 16.96* 18.25*  CALCIUM 9.4 9.1    GFR: Estimated Creatinine Clearance: 7.2 mL/min (A) (by C-G formula based on SCr of 18.25 mg/dL (H)).  Liver Function Tests: Recent Labs  Lab 05/04/21 1851  AST 14*  ALT 15  ALKPHOS 76  BILITOT 0.6  PROT 7.8  ALBUMIN 3.7   No results for input(s): LIPASE, AMYLASE in the last 168 hours. No results for input(s): AMMONIA in the last 168 hours.  Coagulation Profile: No results for input(s): INR, PROTIME in the last 168 hours.  Cardiac Enzymes: No results for input(s): CKTOTAL, CKMB, CKMBINDEX, TROPONINI in the last 168 hours.  BNP (last 3 results) No results for input(s): PROBNP in the last 8760 hours.  Lipid Profile: No results for input(s): CHOL, HDL, LDLCALC, TRIG, CHOLHDL, LDLDIRECT in the last 72 hours.  Thyroid Function Tests: No results for input(s): TSH, T4TOTAL, FREET4, T3FREE, THYROIDAB in the last 72 hours.  Anemia Panel: No results for input(s): VITAMINB12, FOLATE, FERRITIN, TIBC, IRON, RETICCTPCT in the last 72 hours.  Urine analysis:    Component Value Date/Time   COLORURINE STRAW (A) 12/21/2017 0344   APPEARANCEUR CLEAR 12/21/2017 0344   LABSPEC 1.009 12/21/2017 0344   PHURINE 6.0 12/21/2017 0344   GLUCOSEU NEGATIVE 12/21/2017 0344   HGBUR SMALL (A) 12/21/2017 0344   BILIRUBINUR NEGATIVE 12/21/2017 0344   KETONESUR NEGATIVE 12/21/2017  0344   PROTEINUR 100 (A) 12/21/2017 0344   UROBILINOGEN 0.2 05/09/2013 1857   NITRITE NEGATIVE 12/21/2017 0344   LEUKOCYTESUR NEGATIVE 12/21/2017 0344    Sepsis Labs: Lactic Acid, Venous    Component Value Date/Time   LATICACIDVEN 1.1 07/20/2020 1656    MICROBIOLOGY: Recent Results (from the past 240 hour(s))  Resp Panel by RT-PCR (Flu A&B, Covid) Nasopharyngeal Swab     Status: None   Collection Time: 05/04/21  6:56 PM   Specimen: Nasopharyngeal Swab; Nasopharyngeal(NP) swabs in vial transport medium  Result Value Ref Range Status   SARS Coronavirus 2 by RT PCR NEGATIVE NEGATIVE Final    Comment: (NOTE) SARS-CoV-2 target nucleic acids are NOT DETECTED.  The SARS-CoV-2 RNA is generally detectable in upper respiratory specimens during the acute phase of infection. The lowest concentration of SARS-CoV-2 viral copies this assay can detect is 138 copies/mL. A negative result does not preclude SARS-Cov-2  infection and should not be used as the sole basis for treatment or other patient management decisions. A negative result may occur with  improper specimen collection/handling, submission of specimen other than nasopharyngeal swab, presence of viral mutation(s) within the areas targeted by this assay, and inadequate number of viral copies(<138 copies/mL). A negative result must be combined with clinical observations, patient history, and epidemiological information. The expected result is Negative.  Fact Sheet for Patients:  EntrepreneurPulse.com.au  Fact Sheet for Healthcare Providers:  IncredibleEmployment.be  This test is no t yet approved or cleared by the [place] FDA and  has been authorized for detection and/or diagnosis of SARS-CoV-2 by FDA under an Emergency Use Authorization (EUA). This EUA will remain  in effect (meaning this test can be used) for the duration of the COVID-19 declaration under Section 564(b)(1) of the Act,  21 U.S.C.section 360bbb-3(b)(1), unless the authorization is terminated  or revoked sooner.       Influenza A by PCR NEGATIVE NEGATIVE Final   Influenza B by PCR NEGATIVE NEGATIVE Final    Comment: (NOTE) The Xpert Xpress SARS-CoV-2/FLU/RSV plus assay is intended as an aid in the diagnosis of influenza from Nasopharyngeal swab specimens and should not be used as a sole basis for treatment. Nasal washings and aspirates are unacceptable for Xpert Xpress SARS-CoV-2/FLU/RSV testing.  Fact Sheet for Patients: EntrepreneurPulse.com.au  Fact Sheet for Healthcare Providers: IncredibleEmployment.be  This test is not yet approved or cleared by the [place] FDA and has been authorized for detection and/or diagnosis of SARS-CoV-2 by FDA under an Emergency Use Authorization (EUA). This EUA will remain in effect (meaning this test can be used) for the duration of the COVID-19 declaration under Section 564(b)(1) of the Act, 21 U.S.C. section 360bbb-3(b)(1), unless the authorization is terminated or revoked.  Performed at [facility], [place] [address] [place], [place] [address]     RADIOLOGY STUDIES/RESULTS: DG Chest Portable 1 View  Result Date: 05/04/2021 CLINICAL DATA:  Cough and hypoxia EXAM: PORTABLE CHEST 1 VIEW COMPARISON:  07/23/2020 FINDINGS: Cardiac shadow is enlarged but stable. Increased vascular congestion is noted with mild interstitial edema. No focal infiltrate is noted. IMPRESSION: Changes of mild CHF. Electronically Signed   By: Inez Catalina M.D.   On: 05/04/2021 19:08     LOS: 0 days   Oren Binet, MD  [facility]    To contact the attending provider between 7A-7P or the covering provider during after hours 7P-7A, please log into the web site [email or web address] and access using universal [place] password for that web site. If you do not have the password, please call the hospital  operator.  05/05/2021, 11:43 AM

## 2021-05-05 NOTE — ED Notes (Signed)
[facility] contacted to transport the patient to [facility] ED with accepting doctor Rancour

## 2021-05-05 NOTE — Procedures (Signed)
   I was present at this dialysis session, have reviewed the session itself and made  appropriate changes Kelly Splinter MD [place] pager [phone]   05/05/2021, 3:51 PM

## 2021-05-05 NOTE — Progress Notes (Signed)
Pharmacy Antibiotic Note  [name] [name] is a 40 y.o. male admitted on 05/04/2021 with pneumonia.  Pharmacy has been consulted for vancomycin and cefepime dosing.  WBC 7.9, Tmax 101.3, hx ESRD. CXR showing increased vascular congestion with mild interstitial edema (no focal infiltrate).   Plan: Vancomycin 2g IV once then 1 g IV qMWF Cefepime 1g IV every 24 hours Monitor HD schedule, cx results, and LOT  Weight: 123.1 kg (271 lb 6.2 oz)  Temp (24hrs), Avg:99.6 F (37.6 C), Min:98.6 F (37 C), Max:101.3 F (38.5 C)  Recent Labs  Lab 05/04/21 1851 05/05/21 0428  WBC 9.6 7.9  CREATININE 16.96* 18.25*    Estimated Creatinine Clearance: 7.2 mL/min (A) (by C-G formula based on SCr of 18.25 mg/dL (H)).    Allergies  Allergen Reactions   Zyrtec [Cetirizine] Swelling    FACIAL SWELLING, NOT CATEGORIZED    Antimicrobials this admission: Vancomycin 11/18 >>  Cefepime 11/18 >>   Dose adjustments this admission: N/A  Microbiology results: 11/18 BCx: sent 11/18 Resp Cx: sent   11/18 COVID PCR: neg   Thank you for allowing pharmacy to be a part of this patient's care.  Antonietta Jewel, PharmD, [place] Clinical Pharmacist  Phone: [phone] 05/05/2021 3:06 PM  Please check AMION for all [place] phone numbers After 10:00 PM, call [place] [phone]

## 2021-05-05 NOTE — Progress Notes (Signed)
Re-evaluated patient post HD-still coughing-remains on 2-3L of O2. Is febrile Temp 101  Plan: Blood cultures Vanco/Cefepime Repeat CXR in am Reassess in am D/w pt he agrees to remain hospitalized

## 2021-05-05 NOTE — Progress Notes (Signed)
Echocardiogram 2D Echocardiogram has been performed.  Oneal Deputy Eisley Barber RDCS 05/05/2021, 3:52 PM

## 2021-05-05 NOTE — Consult Note (Addendum)
[facility] Renal Consultation Note    Indication for Consultation:  Management of ESRD/hemodialysis, anemia, hypertension/volume, and secondary hyperparathyroidism. PCP:  HPI: [name] [name] is a 40 y.o. male with ESRD, HTN, Lupus, Hx CVA, seizure disorder who was admitted with pulmonary edema.  Presented to [facility] ED with concern of ongoing cough, worse over the past 2-3 weeks. Had already seen UC on 10/25 and given cough syrup and steroid taper without improvement. Cough was productive of mostly clear sputum. In ED, noted to be hypoxic with O2 sat in 70s -> O2 placed with improvement. He was given albuterol neb treatment. EKG without acute changes. CXR with mild vascular congestion/IE. Labs with Na 136, K 4, Cr 18.2, Trop 86, WBC 7.9, Hgb 8.3. COVID/Flu negative. He was admitted and transferred to [facility] for dialysis.  Dialyzes at [facility] on MWF schedule. He has not missed any sessions, last HD was 11/16 - but he left 3kg above his dry weight, reports that the steroids caused him to rapidly gain weight.  Today - appears comfortable on O2. Denies fever, chills, abdominal or chest pain, no N/V/D. Uses L AVF as his access.  Past Medical History:  Diagnosis Date   Chronic kidney disease    ESRD (end stage renal disease) ([place])    Hypertension    Lupus ([place])    Seizures ([place])    Seizures ([place])    Stroke ([place])    7 or 40 years of age.     Wears glasses    Past Surgical History:  Procedure Laterality Date   AV FISTULA PLACEMENT Left 12/30/2017   Procedure: ARTERIOVENOUS (AV) FISTULA CREATION VERSUS INSERTION OF ARTERIOVENOUS GRAFT LEFT ARM;  Surgeon: Rosetta Posner, MD;  Location: [place];  Service: Vascular;  Laterality: Left;   [place] Left 08/15/2018   Procedure: LEFT FIRST STAGE [place];  Surgeon: Rosetta Posner, MD;  Location: [place];  Service: Vascular;  Laterality: Left;   [place] Left 10/30/2018   Procedure: INSERTION OF  GORE-TEX GRAFT LEFT UPPER ARM;  Surgeon: Waynetta Sandy, MD;  Location: [place];  Service: Vascular;  Laterality: Left;   INSERTION OF DIALYSIS CATHETER N/A 09/20/2018   Procedure: INSERTION OF TUNNELED DIALYSIS CATHETER RIGHT INTERNAL JUGULAR;  Surgeon: Marty Heck, MD;  Location: [facility] OR;  Service: Vascular;  Laterality: N/A;   RENAL BIOPSY     Family History  Problem Relation Age of Onset   Diabetes Mother    Diabetes Maternal Grandmother    Seizures Neg Hx        not on mother's side. he doesn't know about his father's side   Social History:  reports that he quit smoking about 3 years ago. His smoking use included cigarettes. He smoked an average of .25 packs per day. He has never used smokeless tobacco. He reports that he does not currently use alcohol. He reports that he does not use drugs.  ROS: As per HPI otherwise negative.  Physical Exam: Vitals:   05/05/21 0500 05/05/21 0600 05/05/21 0630 05/05/21 0810  BP: (!) 154/94 (!) 167/108 (!) 147/86 (!) 180/106  Pulse: 84 89 85 92  Resp: 20 17 16 18   Temp:    98.6 F (37 C)  TempSrc:    Oral  SpO2: 92% 94% 92%   Weight:    123.1 kg     General: Well developed, well nourished, in no acute distress. Nasal O2 in place. Head: Normocephalic, atraumatic, sclera non-icteric,  mucus membranes are moist. Neck: Supple without lymphadenopathy/masses. JVD not elevated. Lungs: Difficult for exam, cough with each deep breath. Faint RLL rales. No wheezing. Heart: RRR with normal S1, S2. No murmurs, rubs, or gallops appreciated. Abdomen: Soft, non-tender, non-distended with normoactive bowel sounds. No rebound/guarding.  Musculoskeletal:  Strength and tone appear normal for age. Lower extremities: 2+ BLE edema to mid-shins. Neuro: Alert and oriented X 3. Moves all extremities spontaneously. Psych:  Responds to questions appropriately with a normal affect. Dialysis Access: L AVF + thrill  Allergies  Allergen Reactions    Zyrtec [Cetirizine] Swelling    FACIAL SWELLING, NOT CATEGORIZED   Prior to Admission medications   Medication Sig Start Date End Date Taking? Authorizing Provider  amLODipine (NORVASC) 10 MG tablet TAKE 1 TABLET BY MOUTH AT BEDTIME Patient taking differently: Take 10 mg by mouth daily. 09/27/20 09/27/21 Yes Gildardo Pounds, NP  apixaban (ELIQUIS) 2.5 MG TABS tablet Take 1 tablet (2.5 mg total) by mouth 2 (two) times daily. 09/27/20 05/04/21 Yes Gildardo Pounds, NP  carvedilol (COREG) 6.25 MG tablet TAKE 1 TABLET (6.25 MG TOTAL) BY MOUTH TWO TIMES DAILY WITH A MEAL. 04/15/21 04/15/22 Yes Gildardo Pounds, NP  cinacalcet (SENSIPAR) 60 MG tablet Take 60 mg by mouth daily. 02/02/21  Yes [provider]  ferric citrate (AURYXIA) 1 GM 210 MG(Fe) tablet Take 4 tablets by mouth 3 times a day with meals and 2 with snacks. Each tablet is 210 mg. 09/27/20  Yes Gildardo Pounds, NP  gabapentin (NEURONTIN) 100 MG capsule Take 100 mg by mouth at bedtime. 12/25/20  Yes [provider]  hydrOXYzine (ATARAX/VISTARIL) 25 MG tablet Take 25 mg by mouth 3 (three) times daily as needed for itching. 11/17/20  Yes [provider]  lamoTRIgine (LAMICTAL) 200 MG tablet TAKE 1 TABLET (200 MG TOTAL) BY MOUTH 2 (TWO) TIMES DAILY. Patient taking differently: Take 200 mg by mouth 2 (two) times daily. 08/04/20 08/04/21 Yes Melvenia Beam, MD  atorvastatin (LIPITOR) 40 MG tablet Take 1 tablet (40 mg total) by mouth daily. Patient not taking: Reported on 05/04/2021 09/29/20   Gildardo Pounds, NP  gabapentin (NEURONTIN) 600 MG tablet Take 0.5 tablets (300 mg total) by mouth at bedtime. Patient not taking: Reported on 05/04/2021 09/27/20 12/26/20  Gildardo Pounds, NP  silver sulfADIAZINE (SILVADENE) 1 % cream Apply to affected skin areas once per day for 10 days or until area is healed Patient not taking: Reported on 07/21/2020 11/24/18   Antony Blackbird, MD   Current Facility-Administered Medications  Medication  Dose Route Frequency Provider Last Rate Last Admin   0.9 %  sodium chloride infusion  100 mL Intravenous PRN Corliss Parish, MD       0.9 %  sodium chloride infusion  100 mL Intravenous PRN Corliss Parish, MD       acetaminophen (TYLENOL) tablet 650 mg  650 mg Oral Q6H PRN Marcelyn Bruins, MD       Or   acetaminophen (TYLENOL) suppository 650 mg  650 mg Rectal Q6H PRN Marcelyn Bruins, MD       alteplase (CATHFLO ACTIVASE) injection 2 mg  2 mg Intracatheter Once PRN Corliss Parish, MD       amLODipine (NORVASC) tablet 10 mg  10 mg Oral Daily Marcelyn Bruins, MD       apixaban Arne [place]) tablet 2.5 mg  2.5 mg Oral BID Marcelyn Bruins, MD       atorvastatin (LIPITOR)  tablet 40 mg  40 mg Oral Daily Marcelyn Bruins, MD       carvedilol (COREG) tablet 6.25 mg  6.25 mg Oral BID WC Marcelyn Bruins, MD       Chlorhexidine Gluconate Cloth 2 % PADS 6 each  6 each Topical Q0600 Corliss Parish, MD       cinacalcet [facility]) tablet 60 mg  60 mg Oral Daily Marcelyn Bruins, MD       ferric citrate (AURYXIA) tablet 210 mg  210 mg Oral TID WC Marcelyn Bruins, MD       gabapentin (NEURONTIN) capsule 100 mg  100 mg Oral QHS Marcelyn Bruins, MD   100 mg at 05/04/21 2344   heparin injection 1,000 Units  1,000 Units Dialysis PRN Corliss Parish, MD       heparin injection 2,500 Units  20 Units/kg Dialysis PRN Corliss Parish, MD       hydrOXYzine (ATARAX/VISTARIL) tablet 25 mg  25 mg Oral TID PRN Marcelyn Bruins, MD       lamoTRIgine (LAMICTAL) tablet 200 mg  200 mg Oral BID Marcelyn Bruins, MD       lidocaine (PF) (XYLOCAINE) 1 % injection 5 mL  5 mL Intradermal PRN Corliss Parish, MD       lidocaine-prilocaine (EMLA) cream 1 application  1 application Topical PRN Corliss Parish, MD       pentafluoroprop-tetrafluoroeth (GEBAUERS) aerosol 1 application  1 application Topical PRN Corliss Parish, MD       sodium chloride  flush (NS) 0.9 % injection 3 mL  3 mL Intravenous Q12H Marcelyn Bruins, MD   3 mL at 05/04/21 2345   Labs: Basic Metabolic Panel: Recent Labs  Lab 05/04/21 1851 05/05/21 0428  NA 138 136  K 3.8 4.0  CL 95* 94*  CO2 27 27  GLUCOSE 91 93  BUN 64* 69*  CREATININE 16.96* 18.25*  CALCIUM 9.4 9.1   Liver Function Tests: Recent Labs  Lab 05/04/21 1851  AST 14*  ALT 15  ALKPHOS 76  BILITOT 0.6  PROT 7.8  ALBUMIN 3.7   CBC: Recent Labs  Lab 05/04/21 1851 05/05/21 0428  WBC 9.6 7.9  NEUTROABS 7.3  --   HGB 9.3* 8.3*  HCT 28.6* 25.8*  MCV 97.9 98.5  PLT 130* 131*   Studies/Results: DG Chest Portable 1 View  Result Date: 05/04/2021 CLINICAL DATA:  Cough and hypoxia EXAM: PORTABLE CHEST 1 VIEW COMPARISON:  07/23/2020 FINDINGS: Cardiac shadow is enlarged but stable. Increased vascular congestion is noted with mild interstitial edema. No focal infiltrate is noted. IMPRESSION: Changes of mild CHF. Electronically Signed   By: Inez Catalina M.D.   On: 05/04/2021 19:08    Dialysis Orders:  MWF at [facility] 4hr, 200 dialyzer, 450/800, EDW 120.2kg, 2K/2Ca, AVG, heparin 3700 unit bolus - Calcitriol 2.48mcg PO q HD - No ESA, Hgb typically > 11, then 9.6 on 11/16.  Assessment/Plan:  Pulm edema/cough/overload: HD today, 4L UFG and tolerating. CXR without pneumonia, afebrile. Consider CT if not improvement with dialysis.  ESRD:  Usual MWF schedule - HD today, as above.   Hypertension/volume: BP up with edema, plan as above.  Anemia: Hgb 8.3 - dropping  over past week as OP then here. Will give dose ESA today. Denies GI losses, will check stool cards. On Eliquis.  Metabolic bone disease: Ca ok, continue VDRA.  Nutrition:  Alb ok, encourage ^ protein diet.  Lupus/APL abx: On Eliquis  Hx  CVA  Seizure disorder  Veneta Penton, Hershal Coria 05/05/2021, 8:51 AM  [employer]

## 2021-05-06 ENCOUNTER — Inpatient Hospital Stay (HOSPITAL_COMMUNITY): Payer: No Typology Code available for payment source

## 2021-05-06 LAB — MRSA NEXT GEN BY PCR, NASAL: MRSA by PCR Next Gen: NOT DETECTED

## 2021-05-06 LAB — CBC
HCT: 29.7 % — ABNORMAL LOW (ref 39.0–52.0)
HCT: 32.2 % — ABNORMAL LOW (ref 39.0–52.0)
Hemoglobin: 9.3 g/dL — ABNORMAL LOW (ref 13.0–17.0)
Hemoglobin: 10.3 g/dL — ABNORMAL LOW (ref 13.0–17.0)
MCH: 30.6 pg (ref 26.0–34.0)
MCH: 30.9 pg (ref 26.0–34.0)
MCHC: 31.3 g/dL (ref 30.0–36.0)
MCHC: 32 g/dL (ref 30.0–36.0)
MCV: 97.7 fL (ref 80.0–100.0)
MCV: 96.7 fL (ref 80.0–100.0)
Platelets: 100 10*3/uL — ABNORMAL LOW (ref 150–400)
Platelets: 120 10*3/uL — ABNORMAL LOW (ref 150–400)
RBC: 3.04 MIL/uL — ABNORMAL LOW (ref 4.22–5.81)
RBC: 3.33 MIL/uL — ABNORMAL LOW (ref 4.22–5.81)
RDW: 13.4 % (ref 11.5–15.5)
RDW: 13 % (ref 11.5–15.5)
WBC: 6.2 10*3/uL (ref 4.0–10.5)
WBC: 6.8 10*3/uL (ref 4.0–10.5)
nRBC: 0 % (ref 0.0–0.2)
nRBC: 0 % (ref 0.0–0.2)

## 2021-05-06 LAB — RENAL FUNCTION PANEL
Albumin: 2.9 g/dL — ABNORMAL LOW (ref 3.5–5.0)
Albumin: 3.1 g/dL — ABNORMAL LOW (ref 3.5–5.0)
Anion gap: 14 (ref 5–15)
Anion gap: 15 (ref 5–15)
BUN: 45 mg/dL — ABNORMAL HIGH (ref 6–20)
BUN: 27 mg/dL — ABNORMAL HIGH (ref 6–20)
CO2: 26 mmol/L (ref 22–32)
CO2: 25 mmol/L (ref 22–32)
Calcium: 9 mg/dL (ref 8.9–10.3)
Calcium: 9.1 mg/dL (ref 8.9–10.3)
Chloride: 94 mmol/L — ABNORMAL LOW (ref 98–111)
Chloride: 96 mmol/L — ABNORMAL LOW (ref 98–111)
Creatinine, Ser: 12.92 mg/dL — ABNORMAL HIGH (ref 0.61–1.24)
Creatinine, Ser: 9.07 mg/dL — ABNORMAL HIGH (ref 0.61–1.24)
GFR, Estimated: 4 mL/min — ABNORMAL LOW (ref >60)
GFR, Estimated: 7 mL/min — ABNORMAL LOW (ref >60)
Glucose, Bld: 92 mg/dL (ref 70–99)
Glucose, Bld: 97 mg/dL (ref 70–99)
Phosphorus: 5.8 mg/dL — ABNORMAL HIGH (ref 2.5–4.6)
Phosphorus: 3.3 mg/dL (ref 2.5–4.6)
Potassium: 3.9 mmol/L (ref 3.5–5.1)
Potassium: 3.9 mmol/L (ref 3.5–5.1)
Sodium: 134 mmol/L — ABNORMAL LOW (ref 135–145)
Sodium: 136 mmol/L (ref 135–145)

## 2021-05-06 LAB — RESPIRATORY PANEL BY PCR

## 2021-05-06 LAB — SEDIMENTATION RATE: Sed Rate: 110 mm/hr — ABNORMAL HIGH (ref 0–16)

## 2021-05-06 LAB — HEPATITIS B SURFACE ANTIBODY, QUANTITATIVE: Hep B S AB Quant (Post): >1000 m[IU]/mL (ref >9.9)

## 2021-05-06 LAB — C-REACTIVE PROTEIN: CRP: 19.2 mg/dL — ABNORMAL HIGH (ref <1.0)

## 2021-05-06 MED ORDER — SODIUM CHLORIDE 0.9 % IV SOLN: 100.0000 mL | INTRAVENOUS | Status: DC | PRN

## 2021-05-06 MED ORDER — PENTAFLUOROPROP-TETRAFLUOROETH EX AERO: 1.0000 "application " | INHALATION_SPRAY | CUTANEOUS | Status: DC | PRN

## 2021-05-06 MED ORDER — LIDOCAINE-PRILOCAINE 2.5-2.5 % EX CREA: 1.0000 "application " | TOPICAL_CREAM | CUTANEOUS | Status: DC | PRN

## 2021-05-06 MED ORDER — ALTEPLASE 2 MG IJ SOLR: 2.0000 mg | Freq: Once | INTRAMUSCULAR | Status: DC | PRN

## 2021-05-06 MED ORDER — IPRATROPIUM-ALBUTEROL 0.5-2.5 (3) MG/3ML IN SOLN
3.0000 mL | Freq: Two times a day (BID) | RESPIRATORY_TRACT | Status: DC
  Filled 2021-05-06: qty 3

## 2021-05-06 MED ORDER — LIDOCAINE HCL (PF) 1 % IJ SOLN: 5.0000 mL | INTRAMUSCULAR | Status: DC | PRN

## 2021-05-06 MED ORDER — SODIUM CHLORIDE 0.9 % IV SOLN: 2.0000 g | INTRAVENOUS | Status: DC

## 2021-05-06 MED ORDER — CHLORHEXIDINE GLUCONATE CLOTH 2 % EX PADS
6.0000 | MEDICATED_PAD | Freq: Every day | CUTANEOUS | Status: DC
  Administered 2021-05-06 – 2021-05-07 (×2): 6 via TOPICAL

## 2021-05-06 MED ORDER — HEPARIN SODIUM (PORCINE) 1000 UNIT/ML DIALYSIS: 1000.0000 [IU] | INTRAMUSCULAR | Status: DC | PRN

## 2021-05-06 MED ORDER — CHLORHEXIDINE GLUCONATE CLOTH 2 % EX PADS
6.0000 | MEDICATED_PAD | Freq: Every day | CUTANEOUS | Status: DC
  Administered 2021-05-07: 6 via TOPICAL

## 2021-05-06 NOTE — Progress Notes (Addendum)
[name] Progress Note   Subjective:   Patient seen and examined at bedside.  Cough improved but continues to have SOB and require O2.  States he feels like he still has fluid on.  CXR this AM appears worse than one on admission despite HD yesterday with UF 4L.  Denies history of lung disease or lung involvement of Lupus.  Denies CP, abdominal pain, and n/v/d.  Does not believe he was febrile yesterday.  Denies fever and chills.   Objective Vitals:   05/06/21 0414 05/06/21 0549 05/06/21 0951 05/06/21 1000  BP: (!) 156/103  136/84   Pulse: 89  86   Resp:   19   Temp: 98 F (36.7 C)  98.5 F (36.9 C)   TempSrc: Oral     SpO2: 100% 97% 95% 95%  Weight: 121.1 kg      Physical Exam General:Well appearing male in NAD Heart:RRR, no mrg Lungs:breath sounds decreased, no crackles, nml WOB on 3L O2 via [place] Abdomen:soft, NTND Extremities:trace LE edema Dialysis Access: LU AVF +b/t   Filed Weights   05/05/21 0810 05/05/21 1321 05/06/21 0414  Weight: 123.1 kg 121.1 kg 121.1 kg    Intake/Output Summary (Last 24 hours) at 05/06/2021 1146 Last data filed at 05/06/2021 1000 Gross per 24 hour  Intake 1353.77 ml  Output 4000 ml  Net -2646.23 ml    Additional Objective Labs: Basic Metabolic Panel: Recent Labs  Lab 05/04/21 1851 05/05/21 0428 05/06/21 0252  NA 138 136 134*  K 3.8 4.0 3.9  CL 95* 94* 94*  CO2 27 27 26   GLUCOSE 91 93 92  BUN 64* 69* 45*  CREATININE 16.96* 18.25* 12.92*  CALCIUM 9.4 9.1 9.0  PHOS  --   --  5.8*   Liver Function Tests: Recent Labs  Lab 05/04/21 1851 05/06/21 0252  AST 14*  --   ALT 15  --   ALKPHOS 76  --   BILITOT 0.6  --   PROT 7.8  --   ALBUMIN 3.7 2.9*    CBC: Recent Labs  Lab 05/04/21 1851 05/05/21 0428 05/06/21 0252  WBC 9.6 7.9 6.2  NEUTROABS 7.3  --   --   HGB 9.3* 8.3* 9.3*  HCT 28.6* 25.8* 29.7*  MCV 97.9 98.5 97.7  PLT 130* 131* 100*    DG Chest Port 1 View  Result Date: 05/06/2021 CLINICAL  DATA:  Shortness of breath. EXAM: PORTABLE CHEST 1 VIEW COMPARISON:  05/04/2021 FINDINGS: 0449 hours. Low lung volumes. There is pulmonary vascular congestion without overt pulmonary edema. The cardio pericardial silhouette is enlarged. The visualized bony structures of the thorax show no acute abnormality. Telemetry leads overlie the chest. IMPRESSION: Low volume film with pulmonary vascular congestion. Electronically Signed   By: Misty Stanley M.D.   On: 05/06/2021 05:54   DG Chest Portable 1 View  Result Date: 05/04/2021 CLINICAL DATA:  Cough and hypoxia EXAM: PORTABLE CHEST 1 VIEW COMPARISON:  07/23/2020 FINDINGS: Cardiac shadow is enlarged but stable. Increased vascular congestion is noted with mild interstitial edema. No focal infiltrate is noted. IMPRESSION: Changes of mild CHF. Electronically Signed   By: Inez Catalina M.D.   On: 05/04/2021 19:08   ECHOCARDIOGRAM COMPLETE  Result Date: 05/05/2021    ECHOCARDIOGRAM REPORT   Patient Name:   [name] [name] Date of Exam: 05/05/2021 Medical Rec #:  [id number]      Height:       72.0 in Accession #:    [id number]  Weight:       271.4 lb Date of Birth:  1981/05/18     BSA:          2.426 m Patient Age:    39 years       BP:           157/100 mmHg Patient Gender: M              HR:           101 bpm. Exam Location:  Inpatient Procedure: 2D Echo, Color Doppler and Cardiac Doppler Indications:    R06.9 DOE  History:        Patient has prior history of Echocardiogram examinations, most                 recent 12/22/2017. Risk Factors:Hypertension and Lupus. ESRD.  Sonographer:    Raquel Sarna Senior RDCS Referring Phys: [id number] [place]  1. Moderate hypertrophy of the basal septum with otherwise mild concentric LVH. Left ventricular ejection fraction, by estimation, is 60 to 65%. The left ventricle has normal function. The left ventricle has no regional wall motion abnormalities. There is moderate left ventricular hypertrophy of the basal-septal  segment. Left ventricular diastolic parameters are consistent with Grade I diastolic dysfunction (impaired relaxation). Elevated left ventricular end-diastolic pressure.  2. Right ventricular systolic function is normal. The right ventricular size is normal.  3. Left atrial size was moderately dilated.  4. Mitral valve leaflet motion is severely restricted posterior leaflet. There is a small (0.92 x 0.77 cm) mobile density at the posterior leaflet tip. Recommend TEE to better assess. The mitral valve is abnormal. Trivial mitral valve regurgitation. Severe mitral stenosis. The mean mitral valve gradient is 12.0 mmHg.  5. The aortic valve is tricuspid. There is mild calcification of the aortic valve. There is mild thickening of the aortic valve. Aortic valve regurgitation is not visualized. No aortic stenosis is present.  6. Aortic dilatation noted. There is mild dilatation of the ascending aorta, measuring 38 mm.  7. The inferior vena cava is normal in size with greater than 50% respiratory variability, suggesting right atrial pressure of 3 mmHg. FINDINGS  Left Ventricle: Moderate hypertrophy of the basal septum with otherwise mild concentric LVH. Left ventricular ejection fraction, by estimation, is 60 to 65%. The left ventricle has normal function. The left ventricle has no regional wall motion abnormalities. The left ventricular internal cavity size was normal in size. There is moderate left ventricular hypertrophy of the basal-septal segment. Left ventricular diastolic parameters are consistent with Grade I diastolic dysfunction (impaired relaxation). Elevated left ventricular end-diastolic pressure. Right Ventricle: The right ventricular size is normal. No increase in right ventricular wall thickness. Right ventricular systolic function is normal. Left Atrium: Left atrial size was moderately dilated. Right Atrium: Right atrial size was normal in size. Pericardium: There is no evidence of pericardial effusion.  Mitral Valve: Mitral valve leaflet motion is severely restricted posterior leaflet. There is a small (0.92 x 0.77 cm) mobile density at the posterior leaflet tip. Recommend TEE to better assess. The mitral valve is abnormal. There is moderate thickening of the mitral valve leaflet(s). There is moderate calcification of the mitral valve leaflet(s). Severely decreased mobility of the mitral valve leaflets. Mild mitral annular calcification. Trivial mitral valve regurgitation. Severe mitral valve stenosis.  MV peak gradient, 21.2 mmHg. The mean mitral valve gradient is 12.0 mmHg. Tricuspid Valve: The tricuspid valve is normal in structure. Tricuspid valve regurgitation is not demonstrated. No  evidence of tricuspid stenosis. Aortic Valve: The aortic valve is tricuspid. There is mild calcification of the aortic valve. There is mild thickening of the aortic valve. Aortic valve regurgitation is not visualized. No aortic stenosis is present. Aortic valve mean gradient measures 10.0 mmHg. Aortic valve peak gradient measures 17.1 mmHg. Aortic valve area, by VTI measures 3.40 cm. Pulmonic Valve: The pulmonic valve was normal in structure. Pulmonic valve regurgitation is not visualized. No evidence of pulmonic stenosis. Aorta: Aortic dilatation noted. There is mild dilatation of the ascending aorta, measuring 38 mm. Venous: The inferior vena cava is normal in size with greater than 50% respiratory variability, suggesting right atrial pressure of 3 mmHg. IAS/Shunts: No atrial level shunt detected by color flow Doppler.  LEFT VENTRICLE PLAX 2D LVIDd:         4.50 cm   Diastology LVIDs:         3.30 cm   LV e' medial:    7.29 cm/s LV PW:         1.10 cm   LV E/e' medial:  20.7 LV IVS:        1.50 cm   LV e' lateral:   5.98 cm/s LVOT diam:     2.30 cm   LV E/e' lateral: 25.3 LV SV:         111 LV SV Index:   46 LVOT Area:     4.15 cm  RIGHT VENTRICLE RV S prime:     15.70 cm/s TAPSE (M-mode): 2.1 cm LEFT ATRIUM             Index         RIGHT ATRIUM           Index LA diam:        4.50 cm 1.86 cm/m   RA Area:     17.80 cm LA Vol (A2C):   72.1 ml 29.72 ml/m  RA Volume:   42.60 ml  17.56 ml/m LA Vol (A4C):   85.6 ml 35.29 ml/m LA Biplane Vol: 83.7 ml 34.51 ml/m  AORTIC VALVE AV Area (Vmax):    3.03 cm AV Area (Vmean):   3.31 cm AV Area (VTI):     3.40 cm AV Vmax:           207.00 cm/s AV Vmean:          153.000 cm/s AV VTI:            0.326 m AV Peak Grad:      17.1 mmHg AV Mean Grad:      10.0 mmHg LVOT Vmax:         151.00 cm/s LVOT Vmean:        122.000 cm/s LVOT VTI:          0.267 m LVOT/AV VTI ratio: 0.82  AORTA Ao Root diam: 3.60 cm Ao Asc diam:  3.80 cm MITRAL VALVE MV Area (PHT): 3.10 cm     SHUNTS MV Area VTI:   2.63 cm     Systemic VTI:  0.27 m MV Peak grad:  21.2 mmHg    Systemic Diam: 2.30 cm MV Mean grad:  12.0 mmHg MV Vmax:       2.30 m/s MV Vmean:      169.0 cm/s MV Decel Time: 245 msec MV E velocity: 151.00 cm/s MV A velocity: 199.00 cm/s MV E/A ratio:  0.76 Skeet Latch MD Electronically signed by Skeet Latch MD Signature Date/Time: 05/05/2021/7:12:00 PM    Final  Medications:  ceFEPime (MAXIPIME) IV 1 g (05/05/21 1649)   [START ON 05/08/2021] ceFEPime (MAXIPIME) IV     [START ON 05/08/2021] vancomycin      amLODipine  10 mg Oral Daily   apixaban  2.5 mg Oral BID   atorvastatin  40 mg Oral q1800   benzonatate  200 mg Oral TID   carvedilol  6.25 mg Oral BID WC   Chlorhexidine Gluconate Cloth  6 each Topical Q0600   cinacalcet  60 mg Oral Q breakfast   darbepoetin (ARANESP) injection - DIALYSIS  60 mcg Intravenous Q Fri-HD   ferric citrate  210 mg Oral TID WC   gabapentin  100 mg Oral QHS   ipratropium-albuterol  3 mL Nebulization Q8H   lamoTRIgine  200 mg Oral BID   sodium chloride flush  3 mL Intravenous Q12H    Dialysis Orders: MWF at [facility] 4hr, 200 dialyzer, 450/800, EDW 120.2kg, 2K/2Ca, AVG, heparin 3700 unit bolus - Calcitriol 2.38mcg PO q HD - No ESA, Hgb typically > 11, then  9.6 on 11/16.   Assessment/Plan:  Pulm edema/cough/overload: HD today, 4L UFG and tolerating. CXR this AM appeared worse then previous, with vascular congestion noted.  Plan for extra HD today for fluid removal, as well as HD tomorrow.  Per patient no history lung involvement of Lupus.  CT ordered but results pending.  Per PMD going to ask pulm to assess imaging.   ESRD:  Usual MWF schedule - Extra HD today for volume removal, then HD again tomorrow per Holiday schedule.  Due to Thanksgiving holiday patient will run on alternate schedule this week.  Inpatient and outpatient holiday schedules differ due to unforeseen circumstances and are as follows: Normal HD Schedule: Monday Wednesday Friday Inpatient HD schedule at [facility]: Sunday, Tuesday, Friday Outpatient HD schedule: Monday, Wednesday, Saturday They will resume their normal schedule on 05/15/21.    Hypertension: BP improved post dialysis.    Anemia: Hgb 8.3 - dropping  over past week as OP then here. Will give dose ESA today. Denies GI losses, will check stool cards. On Eliquis.  Metabolic bone disease: Ca ok, phos mildly elevated. Continue VDRA and binders.  Nutrition:  Alb ok, encourage ^ protein diet.  Lupus/APL abx: On Eliquis. Will check lupus related labs. No history lung involvement per patient.   Hx CVA  Seizure disorder  Jen Mow, PA-C [facility] 05/06/2021,11:46 AM  LOS: 1 day

## 2021-05-06 NOTE — Plan of Care (Signed)
CHANGE IN DIALYSIS SCHEDULE DUE TO [name] 05-17-1981 [id number]  Patient dialysis schedule for the week of 05/07/21-05/14/21 varies from their normal schedule due to Thanksgiving Holiday.  Unfortunately due to unforeseen circumstances schedule while admitted to [facility] will be different from outpatient dialysis schedule.  Need to keep in mind for discharge planning.  Both schedules are as follows:  Normal HD Schedule: Monday Wednesday Friday Inpatient HD schedule at [facility]: Sunday, Tuesday, Friday Outpatient HD schedule: Monday, Wednesday, Saturday  They will resume their normal schedule on 05/15/21.     Jen Mow, PA-C [facility] Pager: [phone]

## 2021-05-06 NOTE — Plan of Care (Signed)
  Problem: Education: Goal: Knowledge of disease and its progression will improve Outcome: Progressing   Problem: Fluid Volume: Goal: Compliance with measures to maintain balanced fluid volume will improve Outcome: Progressing   Problem: Health Behavior/Discharge Planning: Goal: Ability to manage health-related needs will improve Outcome: Progressing   Problem: Clinical Measurements: Goal: Complications related to the disease process, condition or treatment will be avoided or minimized Outcome: Progressing

## 2021-05-06 NOTE — Progress Notes (Signed)
PROGRESS NOTE        PATIENT DETAILS Name: [name] [name] Age: 40 y.o. Sex: male Date of Birth: [birth year] Admit Date: 05/04/2021 Admitting Physician Marcelyn Bruins, MD [id number], Vernia Buff, NP  Brief Narrative: Patient is a 40 y.o. male with history of ESRD on HD MWF, lupus, HTN, VTE on Eliquis (noncompliant-last dose sometime last month)-brought to the ED with cough/shortness of breath x3 weeks (completed course of steroids/antitussive medications as outpatient)-found to have hypoxia/volume overload and possible pulmonary edema on CXR.  Patient was admitted-and underwent HD on 11/18.  However post HD-patient had fever-and repeat CXR on 11/19 continue to show infiltrate-remains on empiric IV antibiotics-work-up/clinical evaluation in progress.  See below for further details.  Subjective: Better-some cough-wants to go home. CXR (post HD) this morning without much improvement  Objective: Vitals: Blood pressure (!) 156/103, pulse 89, temperature 98 F (36.7 C), temperature source Oral, resp. rate 18, weight 121.1 kg, SpO2 97 %.   Exam: Gen Exam:Alert awake-not in any distress HEENT:atraumatic, normocephalic Chest: B/L clear to auscultation anteriorly CVS:S1S2 regular Abdomen:soft non tender, non distended Extremities:+edema Neurology: Non focal Skin: no rash   Pertinent Labs/Radiology: Recent Labs  Lab 05/04/21 1851 05/05/21 0428 05/06/21 0252  WBC 9.6   < > 6.2  HGB 9.3*   < > 9.3*  PLT 130*   < > 100*  NA 138   < > 134*  K 3.8   < > 3.9  CREATININE 16.96*   < > 12.92*  AST 14*  --   --   ALT 15  --   --   ALKPHOS 76  --   --   BILITOT 0.6  --   --    < > = values in this interval not displayed.    11/17>> COVID/influenza PCR: Negative 11/18>> blood culture: Pending  11/17>>CXR: Changes consistent with pulm edema. 11/19>>CXR:+pul edema/congestion   Assessment/Plan: Acute hypoxic respiratory failure: Initially felt to be due to  volume overload-however even after HD-continues to have hypoxia and similar changes to CXR.  Febrile on 11/18 as well.  Discussed with nephrology-they plan to repeat HD today-given fever-suspect that he may have PNA-has history of lupus-so could have pneumonitis as well.  Plan is to continue IV antibiotics-will check CT chest/inflammatory marker/lupus serology/complement levels/respiratory virus panel.  Follow clinical course-if he continues to have persistent hypoxemia/cough and if inflammatory markers/lupus serology are elevated-may need to touch base with PCCM.  ESRD on HD: Per nephrology he is 3 kg above his dry weight on initial presentation-see above regarding concerns for ongoing pulmonary edema-nephrology plans to dialyze today.  HTN: BP stable-continue Coreg and amlodipine   History of VTE: On compliant with Eliquis as he ran out off it sometime last month-Eliquis has been resumed.  History of CVA: Nonfocal-Eliquis/statin  Seizure disorder: Continue Lamictal  Thrombocytopenia: Mild-stable for follow-up  Normocytic anemia: Due to ESRD/CKD-defer Aranesp/IV iron to nephrology  Lupus: See above discussion  Obesity Estimated body mass index is 36.21 kg/m as calculated from the following:   Height as of 01/24/21: 6' (1.829 m).   Weight as of this encounter: 121.1 kg.    Procedures: None Consults: Renal DVT Prophylaxis: Eliquis Code Status:Full code  Family Communication: None at bedside  Time spent: 25- minutes-Greater than 50% of this time was spent in counseling, explanation of diagnosis, planning of further management,  and coordination of care.   Disposition Plan: Status is: Observation  The patient remains OBS appropriate and will d/c before 2 midnights.   Diet: Diet Order             Diet renal with fluid restriction Fluid restriction: 1200 mL Fluid; Room service appropriate? Yes; Fluid consistency: Thin  Diet effective now                      Antimicrobial agents: Anti-infectives (From admission, onward)    Start     Dose/Rate Route Frequency Ordered Stop   05/08/21 1200  vancomycin (VANCOREADY) IVPB 1000 mg/200 mL        1,000 mg 200 mL/hr over 60 Minutes Intravenous Every M-W-F (Hemodialysis) 05/05/21 1504     05/08/21 1200  ceFEPIme (MAXIPIME) 2 g in sodium chloride 0.9 % 100 mL IVPB        2 g 200 mL/hr over 30 Minutes Intravenous Every M-W-F (Hemodialysis) 05/06/21 0753     05/05/21 1600  vancomycin (VANCOREADY) IVPB 2000 mg/400 mL        2,000 mg 200 mL/hr over 120 Minutes Intravenous  Once 05/05/21 1501 05/05/21 2018   05/05/21 1600  ceFEPIme (MAXIPIME) 1 g in sodium chloride 0.9 % 100 mL IVPB        1 g 200 mL/hr over 30 Minutes Intravenous Every 24 hours 05/05/21 1504 05/07/21 2359        MEDICATIONS: Scheduled Meds:  amLODipine  10 mg Oral Daily   apixaban  2.5 mg Oral BID   atorvastatin  40 mg Oral q1800   benzonatate  200 mg Oral TID   carvedilol  6.25 mg Oral BID WC   Chlorhexidine Gluconate Cloth  6 each Topical Q0600   cinacalcet  60 mg Oral Q breakfast   darbepoetin (ARANESP) injection - DIALYSIS  60 mcg Intravenous Q Fri-HD   ferric citrate  210 mg Oral TID WC   gabapentin  100 mg Oral QHS   ipratropium-albuterol  3 mL Nebulization Q8H   lamoTRIgine  200 mg Oral BID   sodium chloride flush  3 mL Intravenous Q12H   Continuous Infusions:  ceFEPime (MAXIPIME) IV 1 g (05/05/21 1649)   [START ON 05/08/2021] ceFEPime (MAXIPIME) IV     [START ON 05/08/2021] vancomycin     PRN Meds:.acetaminophen **OR** acetaminophen, albuterol, chlorpheniramine-HYDROcodone, hydrALAZINE, hydrOXYzine   I have personally reviewed following labs and imaging studies  LABORATORY DATA: CBC: Recent Labs  Lab 05/04/21 1851 05/05/21 0428 05/06/21 0252  WBC 9.6 7.9 6.2  NEUTROABS 7.3  --   --   HGB 9.3* 8.3* 9.3*  HCT 28.6* 25.8* 29.7*  MCV 97.9 98.5 97.7  PLT 130* 131* 100*     Basic Metabolic  Panel: Recent Labs  Lab 05/04/21 1851 05/05/21 0428 05/06/21 0252  NA 138 136 134*  K 3.8 4.0 3.9  CL 95* 94* 94*  CO2 27 27 26   GLUCOSE 91 93 92  BUN 64* 69* 45*  CREATININE 16.96* 18.25* 12.92*  CALCIUM 9.4 9.1 9.0  PHOS  --   --  5.8*     GFR: Estimated Creatinine Clearance: 10.1 mL/min (A) (by C-G formula based on SCr of 12.92 mg/dL (H)).  Liver Function Tests: Recent Labs  Lab 05/04/21 1851 05/06/21 0252  AST 14*  --   ALT 15  --   ALKPHOS 76  --   BILITOT 0.6  --   PROT 7.8  --  ALBUMIN 3.7 2.9*    No results for input(s): LIPASE, AMYLASE in the last 168 hours. No results for input(s): AMMONIA in the last 168 hours.  Coagulation Profile: No results for input(s): INR, PROTIME in the last 168 hours.  Cardiac Enzymes: No results for input(s): CKTOTAL, CKMB, CKMBINDEX, TROPONINI in the last 168 hours.  BNP (last 3 results) No results for input(s): PROBNP in the last 8760 hours.  Lipid Profile: No results for input(s): CHOL, HDL, LDLCALC, TRIG, CHOLHDL, LDLDIRECT in the last 72 hours.  Thyroid Function Tests: No results for input(s): TSH, T4TOTAL, FREET4, T3FREE, THYROIDAB in the last 72 hours.  Anemia Panel: No results for input(s): VITAMINB12, FOLATE, FERRITIN, TIBC, IRON, RETICCTPCT in the last 72 hours.  Urine analysis:    Component Value Date/Time   COLORURINE STRAW (A) 12/21/2017 0344   APPEARANCEUR CLEAR 12/21/2017 0344   LABSPEC 1.009 12/21/2017 0344   PHURINE 6.0 12/21/2017 0344   GLUCOSEU NEGATIVE 12/21/2017 0344   HGBUR SMALL (A) 12/21/2017 0344   BILIRUBINUR NEGATIVE 12/21/2017 0344   KETONESUR NEGATIVE 12/21/2017 0344   PROTEINUR 100 (A) 12/21/2017 0344   UROBILINOGEN 0.2 05/09/2013 1857   NITRITE NEGATIVE 12/21/2017 0344   LEUKOCYTESUR NEGATIVE 12/21/2017 0344    Sepsis Labs: Lactic Acid, Venous    Component Value Date/Time   LATICACIDVEN 1.1 07/20/2020 1656    MICROBIOLOGY: Recent Results (from the past 240 hour(s))   Resp Panel by RT-PCR (Flu A&B, Covid) Nasopharyngeal Swab     Status: None   Collection Time: 05/04/21  6:56 PM   Specimen: Nasopharyngeal Swab; Nasopharyngeal(NP) swabs in vial transport medium  Result Value Ref Range Status   SARS Coronavirus 2 by RT PCR NEGATIVE NEGATIVE Final    Comment: (NOTE) SARS-CoV-2 target nucleic acids are NOT DETECTED.  The SARS-CoV-2 RNA is generally detectable in upper respiratory specimens during the acute phase of infection. The lowest concentration of SARS-CoV-2 viral copies this assay can detect is 138 copies/mL. A negative result does not preclude SARS-Cov-2 infection and should not be used as the sole basis for treatment or other patient management decisions. A negative result may occur with  improper specimen collection/handling, submission of specimen other than nasopharyngeal swab, presence of viral mutation(s) within the areas targeted by this assay, and inadequate number of viral copies(<138 copies/mL). A negative result must be combined with clinical observations, patient history, and epidemiological information. The expected result is Negative.  Fact Sheet for Patients:  EntrepreneurPulse.com.au  Fact Sheet for Healthcare Providers:  IncredibleEmployment.be  This test is no t yet approved or cleared by the [place] FDA and  has been authorized for detection and/or diagnosis of SARS-CoV-2 by FDA under an Emergency Use Authorization (EUA). This EUA will remain  in effect (meaning this test can be used) for the duration of the COVID-19 declaration under Section 564(b)(1) of the Act, 21 U.S.C.section 360bbb-3(b)(1), unless the authorization is terminated  or revoked sooner.       Influenza A by PCR NEGATIVE NEGATIVE Final   Influenza B by PCR NEGATIVE NEGATIVE Final    Comment: (NOTE) The Xpert Xpress SARS-CoV-2/FLU/RSV plus assay is intended as an aid in the diagnosis of influenza from  Nasopharyngeal swab specimens and should not be used as a sole basis for treatment. Nasal washings and aspirates are unacceptable for Xpert Xpress SARS-CoV-2/FLU/RSV testing.  Fact Sheet for Patients: EntrepreneurPulse.com.au  Fact Sheet for Healthcare Providers: IncredibleEmployment.be  This test is not yet approved or cleared by the [place] and  has been authorized for detection and/or diagnosis of SARS-CoV-2 by FDA under an Emergency Use Authorization (EUA). This EUA will remain in effect (meaning this test can be used) for the duration of the COVID-19 declaration under Section 564(b)(1) of the Act, 21 U.S.C. section 360bbb-3(b)(1), unless the authorization is terminated or revoked.  Performed at [facility], [place] [address], [place], [place] [address]     RADIOLOGY STUDIES/RESULTS: DG Chest Port 1 View  Result Date: 05/06/2021 CLINICAL DATA:  Shortness of breath. EXAM: PORTABLE CHEST 1 VIEW COMPARISON:  05/04/2021 FINDINGS: 0449 hours. Low lung volumes. There is pulmonary vascular congestion without overt pulmonary edema. The cardio pericardial silhouette is enlarged. The visualized bony structures of the thorax show no acute abnormality. Telemetry leads overlie the chest. IMPRESSION: Low volume film with pulmonary vascular congestion. Electronically Signed   By: Misty Stanley M.D.   On: 05/06/2021 05:54   DG Chest Portable 1 View  Result Date: 05/04/2021 CLINICAL DATA:  Cough and hypoxia EXAM: PORTABLE CHEST 1 VIEW COMPARISON:  07/23/2020 FINDINGS: Cardiac shadow is enlarged but stable. Increased vascular congestion is noted with mild interstitial edema. No focal infiltrate is noted. IMPRESSION: Changes of mild CHF. Electronically Signed   By: Inez Catalina M.D.   On: 05/04/2021 19:08   ECHOCARDIOGRAM COMPLETE  Result Date: 05/05/2021    ECHOCARDIOGRAM REPORT   Patient Name:   SEMAJ COBURN Date of Exam:  05/05/2021 Medical Rec #:  [id number]      Height:       72.0 in Accession #:    [id number]     Weight:       271.4 lb Date of Birth:  [birth year]     BSA:          2.426 m Patient Age:    65 years       BP:           157/100 mmHg Patient Gender: M              HR:           101 bpm. Exam Location:  Inpatient Procedure: 2D Echo, Color Doppler and Cardiac Doppler Indications:    R06.9 DOE  History:        Patient has prior history of Echocardiogram examinations, most                 recent 12/22/2017. Risk Factors:Hypertension and Lupus. ESRD.  Sonographer:    Raquel Sarna Senior RDCS Referring Phys: [id number] [place]  1. Moderate hypertrophy of the basal septum with otherwise mild concentric LVH. Left ventricular ejection fraction, by estimation, is 60 to 65%. The left ventricle has normal function. The left ventricle has no regional wall motion abnormalities. There is moderate left ventricular hypertrophy of the basal-septal segment. Left ventricular diastolic parameters are consistent with Grade I diastolic dysfunction (impaired relaxation). Elevated left ventricular end-diastolic pressure.  2. Right ventricular systolic function is normal. The right ventricular size is normal.  3. Left atrial size was moderately dilated.  4. Mitral valve leaflet motion is severely restricted posterior leaflet. There is a small (0.92 x 0.77 cm) mobile density at the posterior leaflet tip. Recommend TEE to better assess. The mitral valve is abnormal. Trivial mitral valve regurgitation. Severe mitral stenosis. The mean mitral valve gradient is 12.0 mmHg.  5. The aortic valve is tricuspid. There is mild calcification of the aortic valve. There is mild thickening of the aortic valve. Aortic valve regurgitation is  not visualized. No aortic stenosis is present.  6. Aortic dilatation noted. There is mild dilatation of the ascending aorta, measuring 38 mm.  7. The inferior vena cava is normal in size with greater than 50%  respiratory variability, suggesting right atrial pressure of 3 mmHg. FINDINGS  Left Ventricle: Moderate hypertrophy of the basal septum with otherwise mild concentric LVH. Left ventricular ejection fraction, by estimation, is 60 to 65%. The left ventricle has normal function. The left ventricle has no regional wall motion abnormalities. The left ventricular internal cavity size was normal in size. There is moderate left ventricular hypertrophy of the basal-septal segment. Left ventricular diastolic parameters are consistent with Grade I diastolic dysfunction (impaired relaxation). Elevated left ventricular end-diastolic pressure. Right Ventricle: The right ventricular size is normal. No increase in right ventricular wall thickness. Right ventricular systolic function is normal. Left Atrium: Left atrial size was moderately dilated. Right Atrium: Right atrial size was normal in size. Pericardium: There is no evidence of pericardial effusion. Mitral Valve: Mitral valve leaflet motion is severely restricted posterior leaflet. There is a small (0.92 x 0.77 cm) mobile density at the posterior leaflet tip. Recommend TEE to better assess. The mitral valve is abnormal. There is moderate thickening of the mitral valve leaflet(s). There is moderate calcification of the mitral valve leaflet(s). Severely decreased mobility of the mitral valve leaflets. Mild mitral annular calcification. Trivial mitral valve regurgitation. Severe mitral valve stenosis.  MV peak gradient, 21.2 mmHg. The mean mitral valve gradient is 12.0 mmHg. Tricuspid Valve: The tricuspid valve is normal in structure. Tricuspid valve regurgitation is not demonstrated. No evidence of tricuspid stenosis. Aortic Valve: The aortic valve is tricuspid. There is mild calcification of the aortic valve. There is mild thickening of the aortic valve. Aortic valve regurgitation is not visualized. No aortic stenosis is present. Aortic valve mean gradient measures 10.0 mmHg.  Aortic valve peak gradient measures 17.1 mmHg. Aortic valve area, by VTI measures 3.40 cm. Pulmonic Valve: The pulmonic valve was normal in structure. Pulmonic valve regurgitation is not visualized. No evidence of pulmonic stenosis. Aorta: Aortic dilatation noted. There is mild dilatation of the ascending aorta, measuring 38 mm. Venous: The inferior vena cava is normal in size with greater than 50% respiratory variability, suggesting right atrial pressure of 3 mmHg. IAS/Shunts: No atrial level shunt detected by color flow Doppler.  LEFT VENTRICLE PLAX 2D LVIDd:         4.50 cm   Diastology LVIDs:         3.30 cm   LV e' medial:    7.29 cm/s LV PW:         1.10 cm   LV E/e' medial:  20.7 LV IVS:        1.50 cm   LV e' lateral:   5.98 cm/s LVOT diam:     2.30 cm   LV E/e' lateral: 25.3 LV SV:         111 LV SV Index:   46 LVOT Area:     4.15 cm  RIGHT VENTRICLE RV S prime:     15.70 cm/s TAPSE (M-mode): 2.1 cm LEFT ATRIUM             Index        RIGHT ATRIUM           Index LA diam:        4.50 cm 1.86 cm/m   RA Area:     17.80 cm LA Vol (A2C):  72.1 ml 29.72 ml/m  RA Volume:   42.60 ml  17.56 ml/m LA Vol (A4C):   85.6 ml 35.29 ml/m LA Biplane Vol: 83.7 ml 34.51 ml/m  AORTIC VALVE AV Area (Vmax):    3.03 cm AV Area (Vmean):   3.31 cm AV Area (VTI):     3.40 cm AV Vmax:           207.00 cm/s AV Vmean:          153.000 cm/s AV VTI:            0.326 m AV Peak Grad:      17.1 mmHg AV Mean Grad:      10.0 mmHg LVOT Vmax:         151.00 cm/s LVOT Vmean:        122.000 cm/s LVOT VTI:          0.267 m LVOT/AV VTI ratio: 0.82  AORTA Ao Root diam: 3.60 cm Ao Asc diam:  3.80 cm MITRAL VALVE MV Area (PHT): 3.10 cm     SHUNTS MV Area VTI:   2.63 cm     Systemic VTI:  0.27 m MV Peak grad:  21.2 mmHg    Systemic Diam: 2.30 cm MV Mean grad:  12.0 mmHg MV Vmax:       2.30 m/s MV Vmean:      169.0 cm/s MV Decel Time: 245 msec MV E velocity: 151.00 cm/s MV A velocity: 199.00 cm/s MV E/A ratio:  0.76 Skeet Latch MD  Electronically signed by Skeet Latch MD Signature Date/Time: 05/05/2021/7:12:00 PM    Final      LOS: 1 day   Oren Binet, MD  [facility]    To contact the attending provider between 7A-7P or the covering provider during after hours 7P-7A, please log into the web site [email or web address] and access using universal [place] password for that web site. If you do not have the password, please call the hospital operator.  05/06/2021, 9:00 AM

## 2021-05-07 ENCOUNTER — Encounter (HOSPITAL_COMMUNITY): Payer: Self-pay | Admitting: Pharmacist Clinician (PhC)/ Clinical Pharmacy Specialist

## 2021-05-07 LAB — PROCALCITONIN: Procalcitonin: 5.18 ng/mL

## 2021-05-07 LAB — C-REACTIVE PROTEIN: CRP: 13.4 mg/dL — ABNORMAL HIGH (ref <1.0)

## 2021-05-07 MED ORDER — DOXYCYCLINE HYCLATE 100 MG PO CAPS: 100.0000 mg | ORAL_CAPSULE | Freq: Two times a day (BID) | ORAL | 0 refills | Status: DC

## 2021-05-07 MED ORDER — APIXABAN 2.5 MG PO TABS: 2.5000 mg | ORAL_TABLET | Freq: Two times a day (BID) | ORAL | 0 refills | Status: DC

## 2021-05-07 MED ORDER — SODIUM CHLORIDE 0.9 % IV SOLN: 2.0000 g | INTRAVENOUS | Status: DC

## 2021-05-07 MED ORDER — CARVEDILOL 6.25 MG PO TABS: 6.2500 mg | ORAL_TABLET | Freq: Two times a day (BID) | ORAL | 0 refills | Status: DC

## 2021-05-07 MED ORDER — ATORVASTATIN CALCIUM 40 MG PO TABS
40.0000 mg | ORAL_TABLET | Freq: Every day | ORAL | 0 refills | Status: DC
Start: 2021-05-07 — End: 2022-03-07

## 2021-05-07 NOTE — Progress Notes (Addendum)
[facility] Progress Note   Subjective:   Patient seen and examined at bedside during dialysis.  Reports breathing better.  No longer requiring O2.  Hoping he will be able to d/c today.  Denies CP, SOB, abdominal pain, weakness, dizziness and fatigue.   Objective Vitals:   05/07/21 0512 05/07/21 0757 05/07/21 0823 05/07/21 0900  BP: (!) 143/93 (!) 148/89 133/82 134/74  Pulse: 90     Resp: _0 Temp: 98.4 F (36.9 C) 98.5 F (36.9 C)    TempSrc:  Oral    SpO2: 91%     Weight:  118.2 kg     Physical Exam General:well appearing male in NAD Heart:RRR no mrg Lungs:CTAB anterolaterally, nml WOB on RA Abdomen:soft, NTND Extremities:no LE edema Dialysis Access: LU AVF in use   Filed Weights   05/06/21 0414 05/06/21 1411 05/07/21 0757  Weight: 121.1 kg 121 kg 118.2 kg    Intake/Output Summary (Last 24 hours) at 05/07/2021 0932 Last data filed at 05/07/2021 1155 Gross per 24 hour  Intake 120 ml  Output 3500 ml  Net -3380 ml    Additional Objective Labs: Basic Metabolic Panel: Recent Labs  Lab 05/05/21 0428 05/06/21 0252 05/06/21 1939  NA 136 134* 136  K 4.0 3.9 3.9  CL 94* 94* 96*  CO2 _1 GLUCOSE 93 92 97  BUN 69* 45* 27*  CREATININE 18.25* 12.92* 9.07*  CALCIUM 9.1 9.0 9.1  PHOS  --  5.8* 3.3   Liver Function Tests: Recent Labs  Lab 05/04/21 1851 05/06/21 0252 05/06/21 1939  AST 14*  --   --   ALT 15  --   --   ALKPHOS 76  --   --   BILITOT 0.6  --   --   PROT 7.8  --   --   ALBUMIN 3.7 2.9* 3.1*   CBC: Recent Labs  Lab 05/04/21 1851 05/05/21 0428 05/06/21 0252 05/06/21 1939  WBC 9.6 7.9 6.2 6.8  NEUTROABS 7.3  --   --   --   HGB 9.3* 8.3* 9.3* 10.3*  HCT 28.6* 25.8* 29.7* 32.2*  MCV 97.9 98.5 97.7 96.7  PLT 130* 131* 100* 120*   Blood Culture    Component Value Date/Time   SDES BLOOD RIGHT HAND 05/05/2021 1519   SDES BLOOD RIGHT ANTECUBITAL 05/05/2021 1519   SPECREQUEST  05/05/2021 1519    BOTTLES DRAWN  AEROBIC ONLY Blood Culture results may not be optimal due to an inadequate volume of blood received in culture bottles   SPECREQUEST  05/05/2021 1519    BOTTLES DRAWN AEROBIC AND ANAEROBIC Blood Culture results may not be optimal due to an inadequate volume of blood received in culture bottles   CULT  05/05/2021 1519    NO GROWTH 1 DAY Performed at [facility], [place] [address] [place], [place] [address]    CULT  05/05/2021 1519    NO GROWTH 1 DAY Performed at [facility], [place] [address] [place], [place] [address]    REPTSTATUS PENDING 05/05/2021 1519   REPTSTATUS PENDING 05/05/2021 1519     Studies/Results: CT CHEST WO CONTRAST  Result Date: 05/06/2021 CLINICAL DATA:  Pneumonia, effusion or abscess suspected, xray done EXAM: CT CHEST WITHOUT CONTRAST TECHNIQUE: Multidetector CT imaging of the chest was performed following the standard protocol without IV contrast. COMPARISON:  Chest x-ray 05/06/2021 FINDINGS: Cardiovascular: Normal heart size. Trace pericardial effusion. The thoracic aorta is normal in  caliber. Coronary artery calcifications. The main pulmonary artery is enlarged in caliber measuring up to 4.2 cm. Mediastinum/Nodes: Enlarged prevascular lymph nodes measuring up to 1 point cm (3:57). Enlarged right paratracheal lymph node measuring 1.3 cm (3:43). Multiple other enlarged mediastinal lymph nodes with as an example a subcarinal lymph node measuring 1.9 cm. No gross hilar adenopathy, noting limited sensitivity for the detection of hilar adenopathy on this noncontrast study. No enlarged axillary lymph nodes. Thyroid gland, trachea, and esophagus demonstrate no significant findings. Lungs/Pleura: Mosaic ground-glass attenuation with mild interlobular septal wall thickening. Few scattered pulmonary micronodules. No pulmonary mass. No pleural effusion. No pneumothorax. Upper Abdomen partially visualized left renal fluid density: Lesion. Perinephric fat stranding  bilaterally. Subcentimeter left renal lesion too small to characterize. Bilateral renal cortical scarring. No acute abnormality. Musculoskeletal: No chest wall abnormality. No suspicious lytic or blastic osseous lesions. No acute displaced fracture. Multilevel degenerative changes of the spine. IMPRESSION: 1. Findings suggestive of pulmonary edema. Differential diagnosis includes alveolar hemorrhage versus infection/inflammation. 2. Mediastinal lymphadenopathy. No gross hilar adenopathy, noting limited sensitivity for the detection of hilar adenopathy on this noncontrast study. Recommend attention on follow-up. 3. Enlarged main pulmonary artery suggestive of pulmonary hypertension. 4. Few scattered pulmonary micronodules. No follow-up needed if patient is low-risk (and has no known or suspected primary neoplasm). Non-contrast chest CT can be considered in 12 months if patient is high-risk. This recommendation follows the consensus statement: Guidelines for Management of Incidental Pulmonary Nodules Detected on CT Images: From the Fleischner Society 2017; Radiology 2017; 284:228-243. 5. Limited evaluation on this noncontrast study. Electronically Signed   By: Iven Finn M.D.   On: 05/06/2021 18:19   DG Chest Port 1 View  Result Date: 05/06/2021 CLINICAL DATA:  Shortness of breath. EXAM: PORTABLE CHEST 1 VIEW COMPARISON:  05/04/2021 FINDINGS: 0449 hours. Low lung volumes. There is pulmonary vascular congestion without overt pulmonary edema. The cardio pericardial silhouette is enlarged. The visualized bony structures of the thorax show no acute abnormality. Telemetry leads overlie the chest. IMPRESSION: Low volume film with pulmonary vascular congestion. Electronically Signed   By: Misty Stanley M.D.   On: 05/06/2021 05:54   ECHOCARDIOGRAM COMPLETE  Result Date: 05/05/2021    ECHOCARDIOGRAM REPORT   Patient Name:   THAI BURGUENO Date of Exam: 05/05/2021 Medical Rec #:  [id number]      Height:       72.0  in Accession #:    [id number]     Weight:       271.4 lb Date of Birth:  03/03/1981     BSA:          2.426 m Patient Age:    40 years       BP:           157/100 mmHg Patient Gender: M              HR:           101 bpm. Exam Location:  Inpatient Procedure: 2D Echo, Color Doppler and Cardiac Doppler Indications:    R06.9 DOE  History:        Patient has prior history of Echocardiogram examinations, most                 recent 12/22/2017. Risk Factors:Hypertension and Lupus. ESRD.  Sonographer:    Raquel Sarna Senior RDCS Referring Phys: [id number] [place]  1. Moderate hypertrophy of the basal septum with otherwise mild concentric LVH. Left ventricular ejection fraction,  by estimation, is 60 to 65%. The left ventricle has normal function. The left ventricle has no regional wall motion abnormalities. There is moderate left ventricular hypertrophy of the basal-septal segment. Left ventricular diastolic parameters are consistent with Grade I diastolic dysfunction (impaired relaxation). Elevated left ventricular end-diastolic pressure.  2. Right ventricular systolic function is normal. The right ventricular size is normal.  3. Left atrial size was moderately dilated.  4. Mitral valve leaflet motion is severely restricted posterior leaflet. There is a small (0.92 x 0.77 cm) mobile density at the posterior leaflet tip. Recommend TEE to better assess. The mitral valve is abnormal. Trivial mitral valve regurgitation. Severe mitral stenosis. The mean mitral valve gradient is 12.0 mmHg.  5. The aortic valve is tricuspid. There is mild calcification of the aortic valve. There is mild thickening of the aortic valve. Aortic valve regurgitation is not visualized. No aortic stenosis is present.  6. Aortic dilatation noted. There is mild dilatation of the ascending aorta, measuring 38 mm.  7. The inferior vena cava is normal in size with greater than 50% respiratory variability, suggesting right atrial pressure of 3  mmHg. FINDINGS  Left Ventricle: Moderate hypertrophy of the basal septum with otherwise mild concentric LVH. Left ventricular ejection fraction, by estimation, is 60 to 65%. The left ventricle has normal function. The left ventricle has no regional wall motion abnormalities. The left ventricular internal cavity size was normal in size. There is moderate left ventricular hypertrophy of the basal-septal segment. Left ventricular diastolic parameters are consistent with Grade I diastolic dysfunction (impaired relaxation). Elevated left ventricular end-diastolic pressure. Right Ventricle: The right ventricular size is normal. No increase in right ventricular wall thickness. Right ventricular systolic function is normal. Left Atrium: Left atrial size was moderately dilated. Right Atrium: Right atrial size was normal in size. Pericardium: There is no evidence of pericardial effusion. Mitral Valve: Mitral valve leaflet motion is severely restricted posterior leaflet. There is a small (0.92 x 0.77 cm) mobile density at the posterior leaflet tip. Recommend TEE to better assess. The mitral valve is abnormal. There is moderate thickening of the mitral valve leaflet(s). There is moderate calcification of the mitral valve leaflet(s). Severely decreased mobility of the mitral valve leaflets. Mild mitral annular calcification. Trivial mitral valve regurgitation. Severe mitral valve stenosis.  MV peak gradient, 21.2 mmHg. The mean mitral valve gradient is 12.0 mmHg. Tricuspid Valve: The tricuspid valve is normal in structure. Tricuspid valve regurgitation is not demonstrated. No evidence of tricuspid stenosis. Aortic Valve: The aortic valve is tricuspid. There is mild calcification of the aortic valve. There is mild thickening of the aortic valve. Aortic valve regurgitation is not visualized. No aortic stenosis is present. Aortic valve mean gradient measures 10.0 mmHg. Aortic valve peak gradient measures 17.1 mmHg. Aortic valve  area, by VTI measures 3.40 cm. Pulmonic Valve: The pulmonic valve was normal in structure. Pulmonic valve regurgitation is not visualized. No evidence of pulmonic stenosis. Aorta: Aortic dilatation noted. There is mild dilatation of the ascending aorta, measuring 38 mm. Venous: The inferior vena cava is normal in size with greater than 50% respiratory variability, suggesting right atrial pressure of 3 mmHg. IAS/Shunts: No atrial level shunt detected by color flow Doppler.  LEFT VENTRICLE PLAX 2D LVIDd:         4.50 cm   Diastology LVIDs:         3.30 cm   LV e' medial:    7.29 cm/s LV PW:  1.10 cm   LV E/e' medial:  20.7 LV IVS:        1.50 cm   LV e' lateral:   5.98 cm/s LVOT diam:     2.30 cm   LV E/e' lateral: 25.3 LV SV:         111 LV SV Index:   46 LVOT Area:     4.15 cm  RIGHT VENTRICLE RV S prime:     15.70 cm/s TAPSE (M-mode): 2.1 cm LEFT ATRIUM             Index        RIGHT ATRIUM           Index LA diam:        4.50 cm 1.86 cm/m   RA Area:     17.80 cm LA Vol (A2C):   72.1 ml 29.72 ml/m  RA Volume:   42.60 ml  17.56 ml/m LA Vol (A4C):   85.6 ml 35.29 ml/m LA Biplane Vol: 83.7 ml 34.51 ml/m  AORTIC VALVE AV Area (Vmax):    3.03 cm AV Area (Vmean):   3.31 cm AV Area (VTI):     3.40 cm AV Vmax:           207.00 cm/s AV Vmean:          153.000 cm/s AV VTI:            0.326 m AV Peak Grad:      17.1 mmHg AV Mean Grad:      10.0 mmHg LVOT Vmax:         151.00 cm/s LVOT Vmean:        122.000 cm/s LVOT VTI:          0.267 m LVOT/AV VTI ratio: 0.82  AORTA Ao Root diam: 3.60 cm Ao Asc diam:  3.80 cm MITRAL VALVE MV Area (PHT): 3.10 cm     SHUNTS MV Area VTI:   2.63 cm     Systemic VTI:  0.27 m MV Peak grad:  21.2 mmHg    Systemic Diam: 2.30 cm MV Mean grad:  12.0 mmHg MV Vmax:       2.30 m/s MV Vmean:      169.0 cm/s MV Decel Time: 245 msec MV E velocity: 151.00 cm/s MV A velocity: 199.00 cm/s MV E/A ratio:  0.76 Skeet Latch MD Electronically signed by Skeet Latch MD Signature  Date/Time: 05/05/2021/7:12:00 PM    Final     Medications:  sodium chloride     sodium chloride     cefTRIAXone (ROCEPHIN)  IV      amLODipine  10 mg Oral Daily   apixaban  2.5 mg Oral BID   atorvastatin  40 mg Oral q1800   benzonatate  200 mg Oral TID   carvedilol  6.25 mg Oral BID WC   Chlorhexidine Gluconate Cloth  6 each Topical Q0600   Chlorhexidine Gluconate Cloth  6 each Topical Q0600   cinacalcet  60 mg Oral Q breakfast   darbepoetin (ARANESP) injection - DIALYSIS  60 mcg Intravenous Q Fri-HD   ferric citrate  210 mg Oral TID WC   gabapentin  100 mg Oral QHS   ipratropium-albuterol  3 mL Nebulization BID   lamoTRIgine  200 mg Oral BID   sodium chloride flush  3 mL Intravenous Q12H    Dialysis Orders: MWF at [facility] 4hr, 200 dialyzer, 450/800, EDW 120.2kg, 2K/2Ca, AVG, heparin 3700 unit bolus - Calcitriol 2.74mg PO q  HD - No ESA, Hgb typically > 11, then 9.6 on 11/16.   Assessment/Plan:  Pulm edema/cough/overload: confirmed on CXR and CT chest.  Improved with serial dialysis.  Under dry weight pre HD this AM.  Reports weight loss, will need lower dry weight on d/c.  Requested standing post weight for more accurate edw. Discussed importance of following fluid restrictions. CT scan showed pulm edema, and some basilar micronodular changes doubtful of significance per radiology reading. As above responding to fluid removal, will need lower dry wt upon dc.   ESRD:  Usual MWF schedule - HD today per hospital holiday schedule.  If d/c today, he is to go to HD again tomorrow per outpatient.  Discussed with patient.  Inpatient and outpatient holiday schedules differ due to unforeseen circumstances and are as follows: Normal HD Schedule: Monday Wednesday Friday Inpatient HD schedule at [facility]: Sunday, Tuesday, Friday Outpatient HD schedule: Monday, Wednesday, Saturday They will resume their normal schedule on 05/15/21.    Hypertension: BP elevated this AM but improving with dialysis.      Anemia: Hgb 10.3 this AM dropping  over past week as OP then here. Aranesp 6mg given with HD on 11/18.  Metabolic bone disease: Ca and phos in goal. Continue VDRA and binders.  Nutrition:  Alb ok, encourage ^ protein diet.  Lupus/APL abx: On Eliquis. ESR pending. No history lung involvement per patient.   Hx CVA  Seizure disorder  LJen Mow PA-C [facility] 05/07/2021,9:32 AM  LOS: 2 days     Pt seen, examined and agree w assess/plan. Pt states he does not want to stay in the hospital and wants to be discharged today.  [facility] 05/07/2021, 9:46 AM

## 2021-05-07 NOTE — Progress Notes (Signed)
Patient stated that he wants to leave and asking about the MD during bedside handoff report. RN explained to patient that MD should round bedside and HD.  Pt. Transported to HD.

## 2021-05-07 NOTE — Plan of Care (Signed)
                                      [facility]                            [address] [place], [place] [address]      [name] [name] was admitted to the Hospital on 05/04/2021 and Discharged  05/07/2021 and should be excused from work/school   for 3 days starting from date -  05/04/2021 , may return to work/school without any restrictions.  Call Lala Lund MD, [facility]  [phone] with questions.  Lala Lund M.D on 05/07/2021,at 1:26 PM  [facility]   Office  [phone]

## 2021-05-07 NOTE — Discharge Summary (Signed)
[name] [name] UXN:[id number] DOB: Sep 25, 1980 DOA: 05/04/2021  PCP: Gildardo Pounds, NP  Admit date: 05/04/2021  Discharge date: 05/07/2021  Admitted From: Home   Disposition:  Home   Recommendations for Outpatient Follow-up:   Follow up with PCP in 1-2 weeks  PCP Please obtain BMP/CBC, 2 view CXR in 1week,  (see Discharge instructions)   PCP Please follow up on the following pending results: Neck CBC, BMP, ESR, CRP and a two-view chest x-ray in 7 to 10 days.  Will benefit from outpatient rheumatology and pulmonary follow-up.   Home Health: None   Equipment/Devices: None  Consultations: Renal Discharge Condition: Stable    CODE STATUS: Full    Diet Recommendation:     Diet Order             Diet renal with fluid restriction Fluid restriction: 1200 mL Fluid; Room service appropriate? Yes; Fluid consistency: Thin  Diet effective now                    Chief Complaint  Patient presents with   Cough     Brief history of present illness from the day of admission and additional interim summary     Patient is a 40 y.o. male with history of ESRD on HD MWF, lupus, HTN, VTE on Eliquis (noncompliant-last dose sometime last month)-brought to the ED with cough/shortness of breath x3 weeks (completed course of steroids/antitussive medications as outpatient)-found to have hypoxia/volume overload and possible pulmonary edema on CXR.  Patient was admitted-and underwent HD on 11/18.  However post HD-patient had fever-and repeat CXR on 11/19 continue to show infiltrate-remains on empiric IV antibiotics-work-up/clinical evaluation in progress.  See below for further details.                                                                  Hospital Course    Acute hypoxic respiratory failure: Likely due to volume  overload versus atypical pneumonia, received empiric antibiotic trial and HD treatments, much improved on 05/07/2021, currently symptom-free and does not want to stay in the hospital any longer and wants to go home as he says he has to pay his bills and go back to work.  I told him that preferably I would discharge him home either Monday or Tuesday that is 1 to 2 days from today however he is adamant that he does not want to be in the hospital anymore.  This time he is getting HD treatment thereafter will follow with his primary nephrologist with outpatient dialysis treatment and fluid removal, counseled on fluid restriction, will get few more days of oral doxycycline upon discharge.  Requested earnestly to follow-up with his rheumatologist on a close basis as his ESR was quite elevated above 100 and there is a possibility  that he could have lupus induced pulmonary inflammation for which he needs close follow-up and monitoring.  He says he does not follow with a rheumatologist yet but will discuss with his EP for the same.  Of note he does not want to undergo a trial of steroid treatment as he says it swells him up.    ESRD on HD: By renal underwent fluid removal per HD with clinical improvement.Marland Kitchen  HTN: BP stable-continue Coreg and amlodipine   History of VTE: On compliant with Eliquis as he ran out off it sometime last month-Eliquis has been resumed.  Counseled on compliance with medications he had stopped taking his Eliquis and statin for a while   History of CVA: Nonfocal-Eliquis/statin   Seizure disorder: Continue Lamictal   Thrombocytopenia: Mild-stable for follow-up   Normocytic anemia: Due to ESRD/CKD-defer Aranesp/IV iron to nephrology   Lupus: See above discussion, will request PCP to arrange for outpatient rheumatology follow-up and pulmonary follow-up if infiltrates and pulmonary issues persist.   Obesity Estimated body mass index is 36, follow with PCP.   Discharge diagnosis      Principal Problem:   Acute respiratory failure with hypoxia (HCC) Active Problems:   THROMBOCYTOPENIA   CVA   Lupus anticoagulant positive   HTN (hypertension)   Seizures (HCC)   ESRD (end stage renal disease) (HCC)   Long-term current use of anticonvulsant   Anemia in chronic kidney disease    Discharge instructions    Discharge Instructions     Discharge instructions   Complete by: As directed    Follow with Primary MD Gildardo Pounds, NP and with your Rheumatologist in 7 days   Get CBC, CMP, ESR, CRP 2 view Chest X ray -  checked next visit within 1 week by Primary MD    Activity: As tolerated with Full fall precautions use walker/cane & assistance as needed  Disposition Home    Diet: Renal diet with 1.2 L fluid restriction per day.  Special Instructions: If you have smoked or chewed Tobacco  in the last 2 yrs please stop smoking, stop any regular Alcohol  and or any Recreational drug use.  On your next visit with your primary care physician please Get Medicines reviewed and adjusted.  Please request your Prim.MD to go over all Hospital Tests and Procedure/Radiological results at the follow up, please get all Hospital records sent to your Prim MD by signing hospital release before you go home.  If you experience worsening of your admission symptoms, develop shortness of breath, life threatening emergency, suicidal or homicidal thoughts you must seek medical attention immediately by calling 911 or calling your MD immediately  if symptoms less severe.  You Must read complete instructions/literature along with all the possible adverse reactions/side effects for all the Medicines you take and that have been prescribed to you. Take any new Medicines after you have completely understood and accpet all the possible adverse reactions/side effects.   Increase activity slowly   Complete by: As directed    No wound care   Complete by: As directed        Discharge  Medications   Allergies as of 05/07/2021       Reactions   Zyrtec [cetirizine] Swelling   FACIAL SWELLING, NOT CATEGORIZED        Medication List     TAKE these medications    amLODipine 10 MG tablet Commonly known as: NORVASC TAKE 1 TABLET BY MOUTH AT BEDTIME What changed:  how  much to take when to take this   apixaban 2.5 MG Tabs tablet Commonly known as: Eliquis Take 1 tablet (2.5 mg total) by mouth 2 (two) times daily.   atorvastatin 40 MG tablet Commonly known as: LIPITOR Take 1 tablet (40 mg total) by mouth daily.   carvedilol 6.25 MG tablet Commonly known as: COREG Take 1 tablet (6.25 mg total) by mouth 2 (two) times daily with a meal. What changed: how much to take   cinacalcet 60 MG tablet Commonly known as: SENSIPAR Take 60 mg by mouth daily.   doxycycline 100 MG capsule Commonly known as: VIBRAMYCIN Take 1 capsule (100 mg total) by mouth 2 (two) times daily.   ferric citrate 1 GM 210 MG(Fe) tablet Commonly known as: Auryxia Take 4 tablets by mouth 3 times a day with meals and 2 with snacks. Each tablet is 210 mg.   gabapentin 600 MG tablet Commonly known as: NEURONTIN Take 0.5 tablets (300 mg total) by mouth at bedtime.   gabapentin 100 MG capsule Commonly known as: NEURONTIN Take 100 mg by mouth at bedtime.   hydrOXYzine 25 MG tablet Commonly known as: ATARAX/VISTARIL Take 25 mg by mouth 3 (three) times daily as needed for itching.   lamoTRIgine 200 MG tablet Commonly known as: LAMICTAL TAKE 1 TABLET (200 MG TOTAL) BY MOUTH 2 (TWO) TIMES DAILY. What changed: how much to take   silver sulfADIAZINE 1 % cream Commonly known as: Silvadene Apply to affected skin areas once per day for 10 days or until area is healed         Follow-up Information     Gildardo Pounds, NP. Schedule an appointment as soon as possible for a visit in 1 week(s).   Specialty: Nurse Practitioner Why: Also follow-up with your rheumatologist within a week for  your lupus. Contact information: [address] [place] [address] [phone]                 Major procedures and Radiology Reports - PLEASE review detailed and final reports thoroughly  -        CT CHEST WO CONTRAST  Result Date: 05/06/2021 CLINICAL DATA:  Pneumonia, effusion or abscess suspected, xray done EXAM: CT CHEST WITHOUT CONTRAST TECHNIQUE: Multidetector CT imaging of the chest was performed following the standard protocol without IV contrast. COMPARISON:  Chest x-ray 05/06/2021 FINDINGS: Cardiovascular: Normal heart size. Trace pericardial effusion. The thoracic aorta is normal in caliber. Coronary artery calcifications. The main pulmonary artery is enlarged in caliber measuring up to 4.2 cm. Mediastinum/Nodes: Enlarged prevascular lymph nodes measuring up to 1 point cm (3:57). Enlarged right paratracheal lymph node measuring 1.3 cm (3:43). Multiple other enlarged mediastinal lymph nodes with as an example a subcarinal lymph node measuring 1.9 cm. No gross hilar adenopathy, noting limited sensitivity for the detection of hilar adenopathy on this noncontrast study. No enlarged axillary lymph nodes. Thyroid gland, trachea, and esophagus demonstrate no significant findings. Lungs/Pleura: Mosaic ground-glass attenuation with mild interlobular septal wall thickening. Few scattered pulmonary micronodules. No pulmonary mass. No pleural effusion. No pneumothorax. Upper Abdomen partially visualized left renal fluid density: Lesion. Perinephric fat stranding bilaterally. Subcentimeter left renal lesion too small to characterize. Bilateral renal cortical scarring. No acute abnormality. Musculoskeletal: No chest wall abnormality. No suspicious lytic or blastic osseous lesions. No acute displaced fracture. Multilevel degenerative changes of the spine. IMPRESSION: 1. Findings suggestive of pulmonary edema. Differential diagnosis includes alveolar hemorrhage versus infection/inflammation.  2. Mediastinal lymphadenopathy. No gross hilar  adenopathy, noting limited sensitivity for the detection of hilar adenopathy on this noncontrast study. Recommend attention on follow-up. 3. Enlarged main pulmonary artery suggestive of pulmonary hypertension. 4. Few scattered pulmonary micronodules. No follow-up needed if patient is low-risk (and has no known or suspected primary neoplasm). Non-contrast chest CT can be considered in 12 months if patient is high-risk. This recommendation follows the consensus statement: Guidelines for Management of Incidental Pulmonary Nodules Detected on CT Images: From the Fleischner Society 2017; Radiology 2017; 284:228-243. 5. Limited evaluation on this noncontrast study. Electronically Signed   By: Iven Finn M.D.   On: 05/06/2021 18:19   DG Chest Port 1 View  Result Date: 05/06/2021 CLINICAL DATA:  Shortness of breath. EXAM: PORTABLE CHEST 1 VIEW COMPARISON:  05/04/2021 FINDINGS: 0449 hours. Low lung volumes. There is pulmonary vascular congestion without overt pulmonary edema. The cardio pericardial silhouette is enlarged. The visualized bony structures of the thorax show no acute abnormality. Telemetry leads overlie the chest. IMPRESSION: Low volume film with pulmonary vascular congestion. Electronically Signed   By: Misty Stanley M.D.   On: 05/06/2021 05:54   DG Chest Portable 1 View  Result Date: 05/04/2021 CLINICAL DATA:  Cough and hypoxia EXAM: PORTABLE CHEST 1 VIEW COMPARISON:  07/23/2020 FINDINGS: Cardiac shadow is enlarged but stable. Increased vascular congestion is noted with mild interstitial edema. No focal infiltrate is noted. IMPRESSION: Changes of mild CHF. Electronically Signed   By: Inez Catalina M.D.   On: 05/04/2021 19:08   ECHOCARDIOGRAM COMPLETE  Result Date: 05/05/2021    ECHOCARDIOGRAM REPORT   Patient Name:   [name] [name] Date of Exam: 05/05/2021 Medical Rec #:  [id number]      Height:       72.0 in Accession #:    [id number]      Weight:       271.4 lb Date of Birth:  [birth year]     BSA:          2.426 m Patient Age:    87 years       BP:           157/100 mmHg Patient Gender: M              HR:           101 bpm. Exam Location:  Inpatient Procedure: 2D Echo, Color Doppler and Cardiac Doppler Indications:    R06.9 DOE  History:        Patient has prior history of Echocardiogram examinations, most                 recent 12/22/2017. Risk Factors:Hypertension and Lupus. ESRD.  Sonographer:    Raquel Sarna Senior RDCS Referring Phys: [id number] [place]  1. Moderate hypertrophy of the basal septum with otherwise mild concentric LVH. Left ventricular ejection fraction, by estimation, is 60 to 65%. The left ventricle has normal function. The left ventricle has no regional wall motion abnormalities. There is moderate left ventricular hypertrophy of the basal-septal segment. Left ventricular diastolic parameters are consistent with Grade I diastolic dysfunction (impaired relaxation). Elevated left ventricular end-diastolic pressure.  2. Right ventricular systolic function is normal. The right ventricular size is normal.  3. Left atrial size was moderately dilated.  4. Mitral valve leaflet motion is severely restricted posterior leaflet. There is a small (0.92 x 0.77 cm) mobile density at the posterior leaflet tip. Recommend TEE to better assess. The mitral valve is abnormal. Trivial mitral valve regurgitation. Severe mitral stenosis.  The mean mitral valve gradient is 12.0 mmHg.  5. The aortic valve is tricuspid. There is mild calcification of the aortic valve. There is mild thickening of the aortic valve. Aortic valve regurgitation is not visualized. No aortic stenosis is present.  6. Aortic dilatation noted. There is mild dilatation of the ascending aorta, measuring 38 mm.  7. The inferior vena cava is normal in size with greater than 50% respiratory variability, suggesting right atrial pressure of 3 mmHg. FINDINGS  Left Ventricle:  Moderate hypertrophy of the basal septum with otherwise mild concentric LVH. Left ventricular ejection fraction, by estimation, is 60 to 65%. The left ventricle has normal function. The left ventricle has no regional wall motion abnormalities. The left ventricular internal cavity size was normal in size. There is moderate left ventricular hypertrophy of the basal-septal segment. Left ventricular diastolic parameters are consistent with Grade I diastolic dysfunction (impaired relaxation). Elevated left ventricular end-diastolic pressure. Right Ventricle: The right ventricular size is normal. No increase in right ventricular wall thickness. Right ventricular systolic function is normal. Left Atrium: Left atrial size was moderately dilated. Right Atrium: Right atrial size was normal in size. Pericardium: There is no evidence of pericardial effusion. Mitral Valve: Mitral valve leaflet motion is severely restricted posterior leaflet. There is a small (0.92 x 0.77 cm) mobile density at the posterior leaflet tip. Recommend TEE to better assess. The mitral valve is abnormal. There is moderate thickening of the mitral valve leaflet(s). There is moderate calcification of the mitral valve leaflet(s). Severely decreased mobility of the mitral valve leaflets. Mild mitral annular calcification. Trivial mitral valve regurgitation. Severe mitral valve stenosis.  MV peak gradient, 21.2 mmHg. The mean mitral valve gradient is 12.0 mmHg. Tricuspid Valve: The tricuspid valve is normal in structure. Tricuspid valve regurgitation is not demonstrated. No evidence of tricuspid stenosis. Aortic Valve: The aortic valve is tricuspid. There is mild calcification of the aortic valve. There is mild thickening of the aortic valve. Aortic valve regurgitation is not visualized. No aortic stenosis is present. Aortic valve mean gradient measures 10.0 mmHg. Aortic valve peak gradient measures 17.1 mmHg. Aortic valve area, by VTI measures 3.40 cm.  Pulmonic Valve: The pulmonic valve was normal in structure. Pulmonic valve regurgitation is not visualized. No evidence of pulmonic stenosis. Aorta: Aortic dilatation noted. There is mild dilatation of the ascending aorta, measuring 38 mm. Venous: The inferior vena cava is normal in size with greater than 50% respiratory variability, suggesting right atrial pressure of 3 mmHg. IAS/Shunts: No atrial level shunt detected by color flow Doppler.  LEFT VENTRICLE PLAX 2D LVIDd:         4.50 cm   Diastology LVIDs:         3.30 cm   LV e' medial:    7.29 cm/s LV PW:         1.10 cm   LV E/e' medial:  20.7 LV IVS:        1.50 cm   LV e' lateral:   5.98 cm/s LVOT diam:     2.30 cm   LV E/e' lateral: 25.3 LV SV:         111 LV SV Index:   46 LVOT Area:     4.15 cm  RIGHT VENTRICLE RV S prime:     15.70 cm/s TAPSE (M-mode): 2.1 cm LEFT ATRIUM             Index        RIGHT ATRIUM  Index LA diam:        4.50 cm 1.86 cm/m   RA Area:     17.80 cm LA Vol (A2C):   72.1 ml 29.72 ml/m  RA Volume:   42.60 ml  17.56 ml/m LA Vol (A4C):   85.6 ml 35.29 ml/m LA Biplane Vol: 83.7 ml 34.51 ml/m  AORTIC VALVE AV Area (Vmax):    3.03 cm AV Area (Vmean):   3.31 cm AV Area (VTI):     3.40 cm AV Vmax:           207.00 cm/s AV Vmean:          153.000 cm/s AV VTI:            0.326 m AV Peak Grad:      17.1 mmHg AV Mean Grad:      10.0 mmHg LVOT Vmax:         151.00 cm/s LVOT Vmean:        122.000 cm/s LVOT VTI:          0.267 m LVOT/AV VTI ratio: 0.82  AORTA Ao Root diam: 3.60 cm Ao Asc diam:  3.80 cm MITRAL VALVE MV Area (PHT): 3.10 cm     SHUNTS MV Area VTI:   2.63 cm     Systemic VTI:  0.27 m MV Peak grad:  21.2 mmHg    Systemic Diam: 2.30 cm MV Mean grad:  12.0 mmHg MV Vmax:       2.30 m/s MV Vmean:      169.0 cm/s MV Decel Time: 245 msec MV E velocity: 151.00 cm/s MV A velocity: 199.00 cm/s MV E/A ratio:  0.76 Skeet Latch MD Electronically signed by Skeet Latch MD Signature Date/Time: 05/05/2021/7:12:00 PM     Final     Micro Results    Recent Results (from the past 240 hour(s))  Resp Panel by RT-PCR (Flu A&B, Covid) Nasopharyngeal Swab     Status: None   Collection Time: 05/04/21  6:56 PM   Specimen: Nasopharyngeal Swab; Nasopharyngeal(NP) swabs in vial transport medium  Result Value Ref Range Status   SARS Coronavirus 2 by RT PCR NEGATIVE NEGATIVE Final    Comment: (NOTE) SARS-CoV-2 target nucleic acids are NOT DETECTED.  The SARS-CoV-2 RNA is generally detectable in upper respiratory specimens during the acute phase of infection. The lowest concentration of SARS-CoV-2 viral copies this assay can detect is 138 copies/mL. A negative result does not preclude SARS-Cov-2 infection and should not be used as the sole basis for treatment or other patient management decisions. A negative result may occur with  improper specimen collection/handling, submission of specimen other than nasopharyngeal swab, presence of viral mutation(s) within the areas targeted by this assay, and inadequate number of viral copies(<138 copies/mL). A negative result must be combined with clinical observations, patient history, and epidemiological information. The expected result is Negative.  Fact Sheet for Patients:  EntrepreneurPulse.com.au  Fact Sheet for Healthcare Providers:  IncredibleEmployment.be  This test is no t yet approved or cleared by the [place] FDA and  has been authorized for detection and/or diagnosis of SARS-CoV-2 by FDA under an Emergency Use Authorization (EUA). This EUA will remain  in effect (meaning this test can be used) for the duration of the COVID-19 declaration under Section 564(b)(1) of the Act, 21 U.S.C.section 360bbb-3(b)(1), unless the authorization is terminated  or revoked sooner.       Influenza A by PCR NEGATIVE NEGATIVE Final   Influenza B by PCR  NEGATIVE NEGATIVE Final    Comment: (NOTE) The Xpert Xpress SARS-CoV-2/FLU/RSV  plus assay is intended as an aid in the diagnosis of influenza from Nasopharyngeal swab specimens and should not be used as a sole basis for treatment. Nasal washings and aspirates are unacceptable for Xpert Xpress SARS-CoV-2/FLU/RSV testing.  Fact Sheet for Patients: EntrepreneurPulse.com.au  Fact Sheet for Healthcare Providers: IncredibleEmployment.be  This test is not yet approved or cleared by the [place] FDA and has been authorized for detection and/or diagnosis of SARS-CoV-2 by FDA under an Emergency Use Authorization (EUA). This EUA will remain in effect (meaning this test can be used) for the duration of the COVID-19 declaration under Section 564(b)(1) of the Act, 21 U.S.C. section 360bbb-3(b)(1), unless the authorization is terminated or revoked.  Performed at [facility], [place] [address] [place], [place] [address]   Culture, blood (routine x 2)     Status: None (Preliminary result)   Collection Time: 05/05/21  3:19 PM   Specimen: BLOOD RIGHT HAND  Result Value Ref Range Status   Specimen Description BLOOD RIGHT HAND  Final   Special Requests   Final    BOTTLES DRAWN AEROBIC ONLY Blood Culture results may not be optimal due to an inadequate volume of blood received in culture bottles   Culture   Final    NO GROWTH 1 DAY Performed at [facility], [place] [address] [place], [place] [address]    Report Status PENDING  Incomplete  Culture, blood (routine x 2)     Status: None (Preliminary result)   Collection Time: 05/05/21  3:19 PM   Specimen: BLOOD  Result Value Ref Range Status   Specimen Description BLOOD RIGHT ANTECUBITAL  Final   Special Requests   Final    BOTTLES DRAWN AEROBIC AND ANAEROBIC Blood Culture results may not be optimal due to an inadequate volume of blood received in culture bottles   Culture   Final    NO GROWTH 1 DAY Performed at [facility], [place] [address],  [place], [place] [address]    Report Status PENDING  Incomplete  MRSA Next Gen by PCR, Nasal     Status: None   Collection Time: 05/06/21 12:16 PM   Specimen: Nasal Mucosa; Nasal Swab  Result Value Ref Range Status   MRSA by PCR Next Gen NOT DETECTED NOT DETECTED Final    Comment: (NOTE) The GeneXpert MRSA Assay (FDA approved for NASAL specimens only), is one component of a comprehensive MRSA colonization surveillance program. It is not intended to diagnose MRSA infection nor to guide or monitor treatment for MRSA infections. Test performance is not FDA approved in patients less than 46 years old. Performed at [facility], [place] [address], [place], [place] [address]   Respiratory (~20 pathogens) panel by PCR     Status: None   Collection Time: 05/06/21  1:53 PM   Specimen: Nasopharyngeal Swab; Respiratory  Result Value Ref Range Status   Adenovirus NOT DETECTED NOT DETECTED Final   Coronavirus 229E NOT DETECTED NOT DETECTED Final    Comment: (NOTE) The Coronavirus on the Respiratory Panel, DOES NOT test for the novel  Coronavirus (2019 nCoV)    Coronavirus HKU1 NOT DETECTED NOT DETECTED Final   Coronavirus NL63 NOT DETECTED NOT DETECTED Final   Coronavirus OC43 NOT DETECTED NOT DETECTED Final   Metapneumovirus NOT DETECTED NOT DETECTED Final   Rhinovirus / Enterovirus NOT DETECTED NOT DETECTED Final   Influenza A  NOT DETECTED NOT DETECTED Final   Influenza B NOT DETECTED NOT DETECTED Final   Parainfluenza Virus 1 NOT DETECTED NOT DETECTED Final   Parainfluenza Virus 2 NOT DETECTED NOT DETECTED Final   Parainfluenza Virus 3 NOT DETECTED NOT DETECTED Final   Parainfluenza Virus 4 NOT DETECTED NOT DETECTED Final   Respiratory Syncytial Virus NOT DETECTED NOT DETECTED Final   Bordetella pertussis NOT DETECTED NOT DETECTED Final   Bordetella Parapertussis NOT DETECTED NOT DETECTED Final   Chlamydophila pneumoniae NOT DETECTED NOT DETECTED Final   Mycoplasma pneumoniae NOT  DETECTED NOT DETECTED Final    Comment: Performed at [facility], [place] [address], [place], [place] [address]    Today   Subjective    [name] [name] today has no headache,no chest abdominal pain,no new weakness tingling or numbness, feels much better wants to go home today.    Objective   Blood pressure 131/78, pulse 88, temperature 98.5 F (36.9 C), temperature source Oral, resp. rate (!) 24, weight 118.2 kg, SpO2 91 %.   Intake/Output Summary (Last 24 hours) at 05/07/2021 1108 Last data filed at 05/07/2021 0512 Gross per 24 hour  Intake 0 ml  Output 3500 ml  Net -3500 ml    Exam  Awake Alert, No new F.N deficits, Normal affect [place].AT,PERRAL Supple Neck,No JVD, No cervical lymphadenopathy appriciated.  Symmetrical Chest wall movement, Good air movement bilaterally, CTAB RRR,No Gallops,Rubs or new Murmurs, No Parasternal Heave +ve B.Sounds, Abd Soft, Non tender, No organomegaly appriciated, No rebound -guarding or rigidity. No Cyanosis, Clubbing or edema, No new Rash or bruise   Data Review   CBC w Diff:  Lab Results  Component Value Date   WBC 6.8 05/06/2021   HGB 10.3 (L) 05/06/2021   HGB 13.0 09/27/2020   HGB 15.5 10/14/2013   HCT 32.2 (L) 05/06/2021   HCT 38.5 09/27/2020   HCT 45.6 10/14/2013   PLT 120 (L) 05/06/2021   PLT 69 (LL) 09/27/2020   LYMPHOPCT 10 05/04/2021   LYMPHOPCT 21.3 10/14/2013   MONOPCT 11 05/04/2021   MONOPCT 7.2 10/14/2013   EOSPCT 2 05/04/2021   EOSPCT 2.5 10/14/2013   BASOPCT 0 05/04/2021   BASOPCT 0.3 10/14/2013    CMP:  Lab Results  Component Value Date   NA 136 05/06/2021   NA 138 09/27/2020   K 3.9 05/06/2021   CL 96 (L) 05/06/2021   CO2 25 05/06/2021   BUN 27 (H) 05/06/2021   BUN 50 (H) 09/27/2020   CREATININE 9.07 (H) 05/06/2021   CREATININE 15.72 (H) 01/24/2021   PROT 7.8 05/04/2021   PROT 8.0 09/27/2020   ALBUMIN 3.1 (L) 05/06/2021   ALBUMIN 4.6 09/27/2020   BILITOT 0.6 05/04/2021   BILITOT 0.4  09/27/2020   ALKPHOS 76 05/04/2021   AST 14 (L) 05/04/2021   ALT 15 05/04/2021  .   Total Time in preparing paper work, data evaluation and todays exam - 69 minutes  Lala Lund M.D on 05/07/2021 at 11:08 AM  [facility]

## 2021-05-07 NOTE — Discharge Instructions (Addendum)
Follow with Primary MD Fleming, Zelda W, NP and with your Rheumatologist in 7 days   Get CBC, CMP, ESR, CRP 2 view Chest X ray -  checked next visit within 1 week by Primary MD    Activity: As tolerated with Full fall precautions use walker/cane & assistance as needed  Disposition Home    Diet: Renal Diet with 1.2 L fluid restriction per day  Special Instructions: If you have smoked or chewed Tobacco  in the last 2 yrs please stop smoking, stop any regular Alcohol  and or any Recreational drug use.  On your next visit with your primary care physician please Get Medicines reviewed and adjusted.  Please request your Prim.MD to go over all Hospital Tests and Procedure/Radiological results at the follow up, please get all Hospital records sent to your Prim MD by signing hospital release before you go home.  If you experience worsening of your admission symptoms, develop shortness of breath, life threatening emergency, suicidal or homicidal thoughts you must seek medical attention immediately by calling 911 or calling your MD immediately  if symptoms less severe.  You Must read complete instructions/literature along with all the possible adverse reactions/side effects for all the Medicines you take and that have been prescribed to you. Take any new Medicines after you have completely understood and accpet all the possible adverse reactions/side effects.         

## 2021-05-07 NOTE — Progress Notes (Addendum)
AVS and Dr's letter or note given and explained to patient.  Patient walked downstairs to wait for his ride to [place] to get his vehicle.

## 2021-05-07 NOTE — Progress Notes (Signed)
MRSA came back neg. No hx of resistant organism from history. D/w Dr. Candiss Norse, we will dc vanc and optimize cefepime to ceftriaxone to complete 5d total. Cultures and resp panel neg.   Onnie Boer, PharmD, BCIDP, AAHIVP, CPP Infectious Disease Pharmacist 05/07/2021 7:49 AM

## 2021-05-07 NOTE — Plan of Care (Signed)

## 2021-05-07 NOTE — Plan of Care (Signed)
  Problem: Nutrition: Goal: Adequate nutrition will be maintained Outcome: Progressing   Problem: Elimination: Goal: Will not experience complications related to bowel motility Outcome: Progressing   Problem: Pain Managment: Goal: General experience of comfort will improve Outcome: Progressing   

## 2021-05-07 NOTE — TOC Transition Note (Signed)
Transition of Care [facility]) - CM/SW Discharge Note   Patient Details  Name: [name] [name] MRN: [id number] Date of Birth: August 03, 1980  Transition [facility]) CM/SW Contact:  Bartholomew Crews, RN Phone Number: [phone] 05/07/2021, 2:18 PM   Clinical Narrative:     Paitent to transition home today. Notified by nursing that patient needed a ride to [facility] where his car is. [employer] teacher. Phone number verified. Ride arranged.   Final next level of care: Home/Self Care Barriers to Discharge: No Barriers Identified   Patient Goals and CMS Choice        Discharge Placement                       Discharge Plan and Services                                     Social Determinants of Health (SDOH) Interventions     Readmission Risk Interventions No flowsheet data found.

## 2021-05-08 ENCOUNTER — Telehealth: Payer: Self-pay

## 2021-05-08 LAB — C3 COMPLEMENT: C3 Complement: 133 mg/dL (ref 82–167)

## 2021-05-08 LAB — C4 COMPLEMENT: Complement C4, Body Fluid: 24 mg/dL (ref 12–38)

## 2021-05-08 LAB — ANTI-DNA ANTIBODY, DOUBLE-STRANDED: ds DNA Ab: 20 IU/mL — ABNORMAL HIGH (ref 0–9)

## 2021-05-08 NOTE — Telephone Encounter (Signed)
Transition Care Management Follow-up Telephone Call Date of discharge and from where: 05/07/2021, [facility]  How have you been since you were released from the hospital? He stated that he is feeling all right now.  Any questions or concerns? No  Items Reviewed: Did the pt receive and understand the discharge instructions provided? Yes  Medications obtained and verified? Yes - he said that he has all medications and did not have any questions about his med regime.  Other? No  Any new allergies since your discharge? No  Dietary orders reviewed? Yes - he said he tries to adhere to the renal diet and fluid restriction but it is difficult.  Do you have support at home? Yes , his roommate  [place] and Equipment/Supplies: Were home health services ordered? no If so, what is the name of the agency? N/a  Has the agency set up a time to come to the patient's home? not applicable Were any new equipment or medical supplies ordered?  No What is the name of the medical supply agency? N/a Were you able to get the supplies/equipment? not applicable Do you have any questions related to the use of the equipment or supplies? No  Attends HD: M/W/F @ [place]  Functional Questionnaire: (I = Independent and D = Dependent) ADLs: independent.   Follow up appointments reviewed:  PCP Hospital f/u appt confirmed? Yes  Scheduled to see Geryl Rankins, NP on 11/29/202. MyChart visit [facility] f/u appt confirmed?  None scheduled at this time Are transportation arrangements needed? No  If their condition worsens, is the pt aware to call PCP or go to the Emergency Dept.? Yes Was the patient provided with contact information for the PCP's office or ED? Yes Was to pt encouraged to call back with questions or concerns? Yes

## 2021-05-09 ENCOUNTER — Telehealth: Payer: Self-pay | Admitting: Nephrology

## 2021-05-09 NOTE — Telephone Encounter (Signed)
Transition of care contact from inpatient facility  Date of discharge: 05/07/21 Date of contact: 05/09/21 Method: Phone Spoke to: Patient  Patient contacted to discuss transition of care from recent inpatient hospitalization. Patient was admitted to [facility] from .11/17-11/20/22.Marland Kitchen with discharge diagnosis of Acute Hypoxic Respiratory Failure   Medication changes were reviewed.  Patient will follow up with his/her outpatient HD unit on: Tomorrow 05/10/21 and reminded him of Holiday schedule wed-sat then MWF next week

## 2021-05-10 DIAGNOSIS — E8770 Fluid overload, unspecified: Secondary | ICD-10-CM | POA: Insufficient documentation

## 2021-05-10 LAB — CULTURE, BLOOD (ROUTINE X 2)
Culture: NO GROWTH
Culture: NO GROWTH

## 2021-05-16 ENCOUNTER — Ambulatory Visit: Payer: No Typology Code available for payment source | Attending: Nurse Practitioner | Admitting: Nurse Practitioner

## 2021-05-16 ENCOUNTER — Other Ambulatory Visit: Payer: Self-pay

## 2021-05-16 ENCOUNTER — Telehealth: Payer: Self-pay | Admitting: Nurse Practitioner

## 2021-05-16 NOTE — Telephone Encounter (Signed)
No answer. UNABLE TO LVM

## 2021-07-03 ENCOUNTER — Encounter (HOSPITAL_COMMUNITY): Payer: Self-pay

## 2021-08-04 ENCOUNTER — Other Ambulatory Visit: Payer: Self-pay

## 2021-08-04 MED FILL — Lamotrigine Tab 200 MG: ORAL | 90 days supply | Qty: 180 | Fill #0 | Status: AC

## 2021-08-04 MED FILL — Lamotrigine Tab 200 MG: ORAL | 90 days supply | Qty: 180 | Fill #3 | Status: CN

## 2021-09-04 ENCOUNTER — Telehealth: Payer: Self-pay | Admitting: Neurology

## 2021-09-04 ENCOUNTER — Ambulatory Visit: Payer: No Typology Code available for payment source | Admitting: Neurology

## 2021-09-04 NOTE — Telephone Encounter (Signed)
Pt was late for appt today r/s with Amy but he has a question. When he is getting dialysis and he is asleep his body jerks. The Dr. At dyalysis told the pt it might be seizure related so he was concerned.Pt still taking his seizure medication regularly. Can you call to discuss.Marland Kitchen Next appt 4.25 with Amy.  ?

## 2021-09-04 NOTE — Telephone Encounter (Signed)
Called patient back . Pt states he is having jerking in his body when he is laying down at home and at dialysis. Informed patient will  inform Dr Jaynee Eagles  ? ? ?

## 2021-09-04 NOTE — Progress Notes (Deleted)
?[facility] ? ? ? ?Provider:  Dr Jaynee Eagles ?Requesting Provider: Gildardo Pounds, NP ?Primary Care Provider:  Gildardo Pounds, NP ? ?CC:  Seizure ? ?Interval history 2/17/2o22: patient had a breakthrough seizure in the setting of PNA and Covid. 41 y.o. male here as requested by Gildardo Pounds, NP for seizures, strokes now on chronic anticoagulation for anticoagulation positive, breakthrough seizure in the setting of noncompliance with his Lamictal. PMHx  hypertension, lupus, seizures, stroke (69 for 41 years of age), hypertension, chronic anticoagulation, lupus anticoagulant positive, obesity, tobacco dependence, end-stage renal disease on dialysis, prediabetes. ? ?Breakthrough in the setting of PNA and Covid ?His Lamictal was adjusted in the hospital. ? ? ?HPI:  [name] [name] is a 41 y.o. male here as requested by Gildardo Pounds, NP for seizures, strokes now on chronic anticoagulation for anticoagulation positive, breakthrough seizure in the setting of noncompliance with his Lamictal. PMHx  hypertension, lupus, seizures, stroke (25 for 41 years of age), hypertension, chronic anticoagulation, lupus anticoagulant positive, obesity, tobacco dependence, end-stage renal disease on dialysis, prediabetes.  I reviewed emergency notes from November 2020: Patient had a witnessed seizure lasting 3-5 minutes while at dialysis, he has a hx of seizures and take lamictal.  There is no seizure activity on arrival but patient did seize in the emergency room, generalized tonic-clonic, no initial focality, generalized shaking, confusion and somnolence, moderate severity, in the context of medical noncompliance, no change in medication no recent head injuries.  At that time he reported his last seizure was 1 month prior, he says he has been taking his antiseizure medication without change(later admitted he missed doses).  Patient had a witnessed seizure in the emergency room and after speaking with neurology he  was loaded with Keppra, CT of the head was negative, patient later admitted he had missed a few days of his Lamictal. ? ?He is here alone. The first time he had a seizure was 8 years ago, his roommate heard him choking and witnessed a seizure. If he misses his medication he has a breakthrough seizure, but if taking every day is well controlled. Last seizures was in November 04/29/2019. We discussed driving restrictions. He reports he had a workup including MRI brain and eeg, its been many years. He does know the seizures are coming. No known family history. No significant history of head trauma. Never had seizures as a child. Described as generalized shaking, tongue biting, he has urinated and defecated. No status epilepticus, never been intubated. Last a minute. He endorses compliance with medication. 15 years ago had a stroke, he was in the hospital for a month, due to drug use and cocaine use. No other focal neurologic deficits, associated symptoms, inciting events or modifiable factors. ? ?Reviewed notes, labs and imaging from outside physicians, which showed: ? ?CT head 04/29/2019: Brain: No acute hemorrhage. Left frontoparietal and right parietal encephalomalacia consistent with prior infarcts, unchanged. Remote lacunar infarct in the left caudate, unchanged. Generalized atrophy and chronic small vessel ischemia but is age advanced, but stable from prior exam. No midline shift or mass effect. No subdural or extra-axial collection. (personally reviewed images (and with patient) and agree with the above) ?  ?Vascular: No hyperdense vessel. ?  ?Skull: No fracture or focal lesion. ?  ?Sinuses/Orbits: Chronic mucosal thickening of left maxillary sinus. ?Mild right maxillary sinus mucosal thickening is new. No sinus fluid ?level. Mastoid air cells are clear. ?  ?Other: None. ?  ?IMPRESSION: ?1. No acute intracranial abnormality. ?  2. Multiple remote infarcts unchanged from prior. Unchanged but age ?advanced atrophy  and chronic small vessel ischemia. ? ? ?lamictal level 4.9 ? ?Review of Systems: ?Patient complains of symptoms per HPI as well as the following symptoms: strokes. Pertinent negatives and positives per HPI. All others negative. ? ? ?Social History  ? ?Socioeconomic History  ? Marital status: Single  ?  Spouse name: Not on file  ? Number of children: 0  ? Years of education: Not on file  ? Highest education level: High school graduate  ?Occupational History  ? Not on file  ?Tobacco Use  ? Smoking status: Former  ?  Packs/day: 0.25  ?  Types: Cigarettes  ?  Quit date: 12/21/2017  ?  Years since quitting: 3.7  ? Smokeless tobacco: Never  ?Vaping Use  ? Vaping Use: Never used  ?Substance and Sexual Activity  ? Alcohol use: Not Currently  ? Drug use: No  ? Sexual activity: Not Currently  ?Other Topics Concern  ? Not on file  ?Social History Narrative  ? Lives with a friend  ? Right handed  ? Caffeine: rare  ? ?Social Determinants of Health  ? ?Financial Resource Strain: Not on file  ?Food Insecurity: Not on file  ?Transportation Needs: Not on file  ?Physical Activity: Not on file  ?Stress: Not on file  ?Social Connections: Not on file  ?Intimate Partner Violence: Not on file  ? ? ?Family History  ?Problem Relation Age of Onset  ? Diabetes Mother   ? Diabetes Maternal Grandmother   ? Seizures Neg Hx   ?     not on mother's side. he doesn't know about his father's side  ? ? ?Past Medical History:  ?Diagnosis Date  ? Chronic kidney disease   ? ESRD (end stage renal disease) ([place])   ? Hypertension   ? Lupus ([place])   ? Seizures ([place])   ? Seizures ([place])   ? Stroke [facility])   ? 49 or 41 years of age.    ? Wears glasses   ? ? ?Patient Active Problem List  ? Diagnosis Date Noted  ? Long-term current use of anticonvulsant 08/04/2020  ? COVID-19 07/26/2020  ? Breakthrough seizure ([place]) 07/20/2020  ? Acute respiratory failure with hypoxia ([place])   ? Lupus ([place])   ? Seizure, late effect of stroke ([place]) 08/26/2019  ? Secondary  hyperparathyroidism of renal origin ([place]) 11/05/2018  ? Anemia in chronic kidney disease 09/23/2018  ? ESRD (end stage renal disease) ([place]) 03/18/2018  ? Prediabetes 01/10/2018  ? Chest pain in adult   ? CKD (chronic kidney disease) stage 3, GFR 30-59 ml/min (HCC) 10/24/2015  ? Seizures ([place]) 10/24/2015  ? HTN (hypertension) 01/12/2015  ? Chronic anticoagulation 04/24/2011  ? Lupus anticoagulant positive 04/24/2011  ? OBESITY, NOS 08/15/2006  ? THROMBOCYTOPENIA 08/15/2006  ? TOBACCO DEPENDENCE 08/15/2006  ? CVA 08/15/2006  ? ? ?Past Surgical History:  ?Procedure Laterality Date  ? AV FISTULA PLACEMENT Left 12/30/2017  ? Procedure: ARTERIOVENOUS (AV) FISTULA CREATION VERSUS INSERTION OF ARTERIOVENOUS GRAFT LEFT ARM;  Surgeon: Rosetta Posner, MD;  Location: [place];  Service: Vascular;  Laterality: Left;  ? BASCILIC VEIN TRANSPOSITION Left 08/15/2018  ? Procedure: LEFT FIRST STAGE [place];  Surgeon: Rosetta Posner, MD;  Location: [place];  Service: Vascular;  Laterality: Left;  ? BASCILIC VEIN TRANSPOSITION Left 10/30/2018  ? Procedure: INSERTION OF GORE-TEX GRAFT LEFT UPPER ARM;  Surgeon: Waynetta Sandy, MD;  Location[facility]  OR;  Service: Vascular;  Laterality: Left;  ? INSERTION OF DIALYSIS CATHETER N/A 09/20/2018  ? Procedure: INSERTION OF TUNNELED DIALYSIS CATHETER RIGHT INTERNAL JUGULAR;  Surgeon: Marty Heck, MD;  Location: [place];  Service: Vascular;  Laterality: N/A;  ? RENAL BIOPSY    ? ? ?Current Outpatient Medications  ?Medication Sig Dispense Refill  ? amLODipine (NORVASC) 10 MG tablet TAKE 1 TABLET BY MOUTH AT BEDTIME (Patient taking differently: Take 10 mg by mouth daily.) 30 tablet 11  ? apixaban (ELIQUIS) 2.5 MG TABS tablet Take 1 tablet (2.5 mg total) by mouth 2 (two) times daily. 120 tablet 0  ? atorvastatin (LIPITOR) 40 MG tablet Take 1 tablet (40 mg total) by mouth daily. 30 tablet 0  ? carvedilol (COREG) 6.25 MG tablet Take 1 tablet (6.25 mg total) by mouth 2 (two) times  daily with a meal. 60 tablet 0  ? cinacalcet (SENSIPAR) 60 MG tablet Take 60 mg by mouth daily.    ? doxycycline (VIBRAMYCIN) 100 MG capsule Take 1 capsule (100 mg total) by mouth 2 (two) times daily. 8 capsule 0  ?

## 2021-09-05 ENCOUNTER — Other Ambulatory Visit: Payer: Self-pay | Admitting: Neurology

## 2021-09-05 DIAGNOSIS — Z79899 Other long term (current) drug therapy: Secondary | ICD-10-CM

## 2021-09-05 NOTE — Telephone Encounter (Addendum)
Spoke with patient today 09/07/2021. Pt has agreed to come in at 1130 to get his Lamictal level drawn  Per Dr.Ahern patient is not to take Lamictal until blood is drawn.Patient is to bring Lamictal with him and take after is blood draw.Pt expressed understanding.  ?

## 2021-09-05 NOTE — Telephone Encounter (Signed)
Called patient back no VM 09/05/2021 Will try again tomorrow.  ?

## 2021-09-07 ENCOUNTER — Other Ambulatory Visit (INDEPENDENT_AMBULATORY_CARE_PROVIDER_SITE_OTHER): Payer: Self-pay

## 2021-09-07 ENCOUNTER — Encounter: Payer: Self-pay | Admitting: *Deleted

## 2021-09-07 DIAGNOSIS — Z0289 Encounter for other administrative examinations: Secondary | ICD-10-CM

## 2021-09-07 DIAGNOSIS — Z79899 Other long term (current) drug therapy: Secondary | ICD-10-CM

## 2021-09-07 NOTE — Telephone Encounter (Addendum)
Pt is not taking his Lamictal has instructed twice daily  the patient states he takes it at different times due to his dialysis Pt states when he was admitted to the hospital his hospitalist   gave him those instructions  .Will make Dr Jaynee Eagles aware  ? ? ?

## 2021-09-08 LAB — LAMOTRIGINE LEVEL: Lamotrigine Lvl: 6.9 ug/mL (ref 2.0–20.0)

## 2021-10-10 ENCOUNTER — Ambulatory Visit: Payer: No Typology Code available for payment source | Admitting: Family Medicine

## 2021-10-16 NOTE — Progress Notes (Deleted)
No chief complaint on file.   HISTORY OF PRESENT ILLNESS:  10/16/21 ALL:  [name] [name] is a 41 y.o. male here today for follow up for seizures. He was last seen by Dr Jaynee Eagles 07/2020. He had a breakthrough seizure 04/2020 in setting of non compliance. He was scheduled to see her multiple times since but has missed appts. Trough level 09/07/2021 was 6.9. He was advised to continue lamotrigine 200mg  BID.    HISTORY (copied from Dr Cathren Laine previous note)  Interval history: patient had a breakthrough seizure in the setting of PNA and Covid. 41 y.o. male here as requested by Gildardo Pounds, NP for seizures, strokes now on chronic anticoagulation for anticoagulation positive, breakthrough seizure in the setting of noncompliance with his Lamictal. PMHx  hypertension, lupus, seizures, stroke (66 for 41 years of age), hypertension, chronic anticoagulation, lupus anticoagulant positive, obesity, tobacco dependence, end-stage renal disease on dialysis, prediabetes.   Breakthrough in the setting of PNA and Covid His Lamictal was adjusted in the hospital.     HPI:  [name] [name] is a 41 y.o. male here as requested by Gildardo Pounds, NP for seizures, strokes now on chronic anticoagulation for anticoagulation positive, breakthrough seizure in the setting of noncompliance with his Lamictal. PMHx  hypertension, lupus, seizures, stroke (56 for 41 years of age), hypertension, chronic anticoagulation, lupus anticoagulant positive, obesity, tobacco dependence, end-stage renal disease on dialysis, prediabetes.  I reviewed emergency notes from November 2020: Patient had a witnessed seizure lasting 3-5 minutes while at dialysis, he has a hx of seizures and take lamictal.  There is no seizure activity on arrival but patient did seize in the emergency room, generalized tonic-clonic, no initial focality, generalized shaking, confusion and somnolence, moderate severity, in the context of medical noncompliance, no  change in medication no recent head injuries.  At that time he reported his last seizure was 1 month prior, he says he has been taking his antiseizure medication without change(later admitted he missed doses).  Patient had a witnessed seizure in the emergency room and after speaking with neurology he was loaded with Keppra, CT of the head was negative, patient later admitted he had missed a few days of his Lamictal.   He is here alone. The first time he had a seizure was 8 years ago, his roommate heard him choking and witnessed a seizure. If he misses his medication he has a breakthrough seizure, but if taking every day is well controlled. Last seizures was in November 04/29/2019. We discussed driving restrictions. He reports he had a workup including MRI brain and eeg, its been many years. He does know the seizures are coming. No known family history. No significant history of head trauma. Never had seizures as a child. Described as generalized shaking, tongue biting, he has urinated and defecated. No status epilepticus, never been intubated. Last a minute. He endorses compliance with medication. 15 years ago had a stroke, he was in the hospital for a month, due to drug use and cocaine use. No other focal neurologic deficits, associated symptoms, inciting events or modifiable factors.   Reviewed notes, labs and imaging from outside physicians, which showed:   CT head 04/29/2019: Brain: No acute hemorrhage. Left frontoparietal and right parietal encephalomalacia consistent with prior infarcts, unchanged. Remote lacunar infarct in the left caudate, unchanged. Generalized atrophy and chronic small vessel ischemia but is age advanced, but stable from prior exam. No midline shift or mass effect. No subdural or extra-axial collection. (  personally reviewed images (and with patient) and agree with the above)   Vascular: No hyperdense vessel.   Skull: No fracture or focal lesion.   Sinuses/Orbits: Chronic  mucosal thickening of left maxillary sinus. Mild right maxillary sinus mucosal thickening is new. No sinus fluid level. Mastoid air cells are clear.   Other: None.   IMPRESSION: 1. No acute intracranial abnormality. 2. Multiple remote infarcts unchanged from prior. Unchanged but age advanced atrophy and chronic small vessel ischemia.     lamictal level 4.9   REVIEW OF SYSTEMS: Out of a complete 14 system review of symptoms, the patient complains only of the following symptoms, and all other reviewed systems are negative.   ALLERGIES: Allergies  Allergen Reactions   Zyrtec [Cetirizine] Swelling    FACIAL SWELLING, NOT CATEGORIZED     HOME MEDICATIONS: Outpatient Medications Prior to Visit  Medication Sig Dispense Refill   amLODipine (NORVASC) 10 MG tablet TAKE 1 TABLET BY MOUTH AT BEDTIME (Patient taking differently: Take 10 mg by mouth daily.) 30 tablet 11   apixaban (ELIQUIS) 2.5 MG TABS tablet Take 1 tablet (2.5 mg total) by mouth 2 (two) times daily. 120 tablet 0   atorvastatin (LIPITOR) 40 MG tablet Take 1 tablet (40 mg total) by mouth daily. 30 tablet 0   carvedilol (COREG) 6.25 MG tablet Take 1 tablet (6.25 mg total) by mouth 2 (two) times daily with a meal. 60 tablet 0   cinacalcet (SENSIPAR) 60 MG tablet Take 60 mg by mouth daily.     doxycycline (VIBRAMYCIN) 100 MG capsule Take 1 capsule (100 mg total) by mouth 2 (two) times daily. 8 capsule 0   ferric citrate (AURYXIA) 1 GM 210 MG(Fe) tablet Take 4 tablets by mouth 3 times a day with meals and 2 with snacks. Each tablet is 210 mg. 270 tablet 6   gabapentin (NEURONTIN) 100 MG capsule Take 100 mg by mouth at bedtime.     gabapentin (NEURONTIN) 600 MG tablet Take 0.5 tablets (300 mg total) by mouth at bedtime. (Patient not taking: Reported on 05/04/2021) 45 tablet 1   hydrOXYzine (ATARAX/VISTARIL) 25 MG tablet Take 25 mg by mouth 3 (three) times daily as needed for itching.     lamoTRIgine (LAMICTAL) 200 MG tablet TAKE 1  TABLET (200 MG TOTAL) BY MOUTH 2 (TWO) TIMES DAILY. (Patient taking differently: Take 200 mg by mouth 2 (two) times daily.) 180 tablet 4   silver sulfADIAZINE (SILVADENE) 1 % cream Apply to affected skin areas once per day for 10 days or until area is healed (Patient not taking: Reported on 07/21/2020) 50 g 1   No facility-administered medications prior to visit.     PAST MEDICAL HISTORY: Past Medical History:  Diagnosis Date   Chronic kidney disease    ESRD (end stage renal disease) ([place])    Hypertension    Lupus ([place])    Seizures ([place])    Seizures ([place])    Stroke ([place])    35 or 41 years of age.     Wears glasses      PAST SURGICAL HISTORY: Past Surgical History:  Procedure Laterality Date   AV FISTULA PLACEMENT Left 12/30/2017   Procedure: ARTERIOVENOUS (AV) FISTULA CREATION VERSUS INSERTION OF ARTERIOVENOUS GRAFT LEFT ARM;  Surgeon: Rosetta Posner, MD;  Location: [facility];  Service: Vascular;  Laterality: Left;   [place] Left 08/15/2018   Procedure: LEFT FIRST STAGE [place];  Surgeon: Rosetta Posner, MD;  Location: [facility];  Service: Vascular;  Laterality: Left;   BASCILIC VEIN TRANSPOSITION Left 10/30/2018   Procedure: INSERTION OF GORE-TEX GRAFT LEFT UPPER ARM;  Surgeon: Waynetta Sandy, MD;  Location: [facility];  Service: Vascular;  Laterality: Left;   INSERTION OF DIALYSIS CATHETER N/A 09/20/2018   Procedure: INSERTION OF TUNNELED DIALYSIS CATHETER RIGHT INTERNAL JUGULAR;  Surgeon: Marty Heck, MD;  Location[facility];  Service: Vascular;  Laterality: N/A;   RENAL BIOPSY       FAMILY HISTORY: Family History  Problem Relation Age of Onset   Diabetes Mother    Diabetes Maternal Grandmother    Seizures Neg Hx        not on mother's side. he doesn't know about his father's side     SOCIAL HISTORY: Social History   Socioeconomic History   Marital status: Single    Spouse name: Not on file   Number of children: 0   Years of  education: Not on file   Highest education level: High school graduate  Occupational History   Not on file  Tobacco Use   Smoking status: Former    Packs/day: 0.25    Types: Cigarettes    Quit date: 12/21/2017    Years since quitting: 3.8   Smokeless tobacco: Never  Vaping Use   Vaping Use: Never used  Substance and Sexual Activity   Alcohol use: Not Currently   Drug use: No   Sexual activity: Not Currently  Other Topics Concern   Not on file  Social History Narrative   Lives with a friend   Right handed   Caffeine: rare   Social Determinants of Health   Financial Resource Strain: Not on file  Food Insecurity: Not on file  Transportation Needs: Not on file  Physical Activity: Not on file  Stress: Not on file  Social Connections: Not on file  Intimate Partner Violence: Not on file     PHYSICAL EXAM  There were no vitals filed for this visit. There is no height or weight on file to calculate BMI.  Generalized: Well developed, in no acute distress  Cardiology: normal rate and rhythm, no murmur auscultated  Respiratory: clear to auscultation bilaterally    Neurological examination  Mentation: Alert oriented to time, place, history taking. Follows all commands speech and language fluent Cranial nerve II-XII: Pupils were equal round reactive to light. Extraocular movements were full, visual field were full on confrontational test. Facial sensation and strength were normal. Uvula tongue midline. Head turning and shoulder shrug  were normal and symmetric. Motor: The motor testing reveals 5 over 5 strength of all 4 extremities. Good symmetric motor tone is noted throughout.  Sensory: Sensory testing is intact to soft touch on all 4 extremities. No evidence of extinction is noted.  Coordination: Cerebellar testing reveals good finger-nose-finger and heel-to-shin bilaterally.  Gait and station: Gait is normal. Tandem gait is normal. Romberg is negative. No drift is seen.   Reflexes: Deep tendon reflexes are symmetric and normal bilaterally.    DIAGNOSTIC DATA (LABS, IMAGING, TESTING) - I reviewed patient records, labs, notes, testing and imaging myself where available.  Lab Results  Component Value Date   WBC 6.8 05/06/2021   HGB 10.3 (L) 05/06/2021   HCT 32.2 (L) 05/06/2021   MCV 96.7 05/06/2021   PLT 120 (L) 05/06/2021      Component Value Date/Time   NA 136 05/06/2021 1939   NA 138 09/27/2020 1140   K 3.9 05/06/2021 1939   CL  96 (L) 05/06/2021 1939   CO2 25 05/06/2021 1939   GLUCOSE 97 05/06/2021 1939   BUN 27 (H) 05/06/2021 1939   BUN 50 (H) 09/27/2020 1140   CREATININE 9.07 (H) 05/06/2021 1939   CREATININE 15.72 (H) 01/24/2021 0938   CALCIUM 9.1 05/06/2021 1939   CALCIUM 8.4 (L) 12/23/2017 0237   PROT 7.8 05/04/2021 1851   PROT 8.0 09/27/2020 1140   ALBUMIN 3.1 (L) 05/06/2021 1939   ALBUMIN 4.6 09/27/2020 1140   AST 14 (L) 05/04/2021 1851   ALT 15 05/04/2021 1851   ALKPHOS 76 05/04/2021 1851   BILITOT 0.6 05/04/2021 1851   BILITOT 0.4 09/27/2020 1140   GFRNONAA 7 (L) 05/06/2021 1939   GFRNONAA 40 (L) 10/24/2015 1603   GFRAA 4 (L) 01/14/2020 2020   GFRAA 47 (L) 10/24/2015 1603   Lab Results  Component Value Date   CHOL 194 09/27/2020   HDL 56 09/27/2020   LDLCALC 114 (H) 09/27/2020   TRIG 138 09/27/2020   CHOLHDL 3.5 09/27/2020   Lab Results  Component Value Date   HGBA1C 5.0 07/20/2020   Lab Results  Component Value Date   VITAMINB12 590 02/24/2009   Lab Results  Component Value Date   TSH 0.711 ***Test methodology is 3rd generation TSH*** 02/16/2009        View : No data to display.               View : No data to display.           ASSESSMENT AND PLAN  41 y.o. year old male  has a past medical history of Chronic kidney disease, ESRD (end stage renal disease) ([place]), Hypertension, Lupus ([place]), Seizures ([place]), Seizures ([place]), Stroke ([place]), and Wears glasses. here with    No diagnosis  found.  Leanord Hawking ***.  Healthy lifestyle habits encouraged. *** will follow up with PCP as directed. *** will return to see me in ***, sooner if needed. *** verbalizes understanding and agreement with this plan.   No orders of the defined types were placed in this encounter.    No orders of the defined types were placed in this encounter.    Debbora Presto, MSN, FNP-C 10/16/2021, 4:32 PM  [facility] [address], [place], [place] [address] [phone]

## 2021-10-16 NOTE — Patient Instructions (Incomplete)
Below is our plan: ? ?We will *** ? ?Please make sure you are consistent with timing of seizure medication. I recommend annual visit with primary care provider (PCP) for complete physical and routine blood work. We will monitor vitamin D level. I recommend daily intake of vitamin D (400-800iu) and calcium (800-1000mg ) for bone health. Discuss Dexa screening with PCP.  ? ?According to [place] law, you can not drive unless you are seizure / syncope free for at least 6 months and under physician's care. ? ?Please maintain precautions. Do not participate in activities where a loss of awareness could harm you or someone else. No swimming alone, no tub bathing, no hot tubs, no driving, no operating motorized vehicles (cars, ATVs, motocycles, etc), lawnmowers, power tools or firearms. No standing at heights, such as rooftops, ladders or stairs. Avoid hot objects such as stoves, heaters, open fires. Wear a helmet when riding a bicycle, scooter, skateboard, etc. and avoid areas of traffic. Set your water heater to 120 degrees or less.  ? ? ?Please make sure you are staying well hydrated. I recommend 50-60 ounces daily. Well balanced diet and regular exercise encouraged. Consistent sleep schedule with 6-8 hours recommended.  ? ?Please continue follow up with care team as directed.  ? ?Follow up with *** in *** ? ?You may receive a survey regarding today's visit. I encourage you to leave honest feed back as I do use this information to improve patient care. Thank you for seeing me today!  ? ? ?

## 2021-10-17 ENCOUNTER — Ambulatory Visit: Payer: No Typology Code available for payment source | Admitting: Family Medicine

## 2021-10-17 DIAGNOSIS — I69398 Other sequelae of cerebral infarction: Secondary | ICD-10-CM

## 2021-12-29 ENCOUNTER — Other Ambulatory Visit: Payer: Self-pay

## 2021-12-29 ENCOUNTER — Telehealth: Payer: Self-pay | Admitting: Internal Medicine

## 2021-12-29 ENCOUNTER — Other Ambulatory Visit: Payer: Self-pay | Admitting: Nurse Practitioner

## 2021-12-29 ENCOUNTER — Other Ambulatory Visit: Payer: Self-pay | Admitting: Neurology

## 2021-12-29 ENCOUNTER — Encounter (HOSPITAL_COMMUNITY): Payer: Self-pay

## 2021-12-29 DIAGNOSIS — I10 Essential (primary) hypertension: Secondary | ICD-10-CM

## 2021-12-29 MED ORDER — AMLODIPINE BESYLATE 10 MG PO TABS
ORAL_TABLET | Freq: Every day | ORAL | 0 refills | Status: DC
  Filled 2021-12-29 – 2022-01-09 (×2): qty 30, 30d supply, fill #0

## 2021-12-29 MED ORDER — LAMOTRIGINE 200 MG PO TABS
ORAL_TABLET | Freq: Two times a day (BID) | ORAL | 0 refills | Status: DC
  Filled 2021-12-29 – 2022-01-09 (×2): qty 60, 30d supply, fill #0

## 2021-12-29 NOTE — Telephone Encounter (Signed)
Patient stopped by the office stating he had an appointment almost a year ago and was supposed to have someone reach out with lab results. Patient states no one ever called him and he requests an urgent call because he has a lot of questions.

## 2021-12-29 NOTE — Telephone Encounter (Signed)
I spoke with [name] [name] today. Last year after initial visit future labs were ordered to follow up his thrombocytopenia but never returned for these. We were also considering starting HCQ for ongoing skin symptoms but I did not want to with low cell count at the time. His platelet counts improved over time since then as monitored with other provider offices.   Recently he is having more trouble with itchy skin with raised bumps mostly occurring when very hot. This is more noticeable this summer and worse the hotter it gets. He also notices these symptoms when indoors when hot. Not currently on any medication for this.  He scheduled appointment for next month. I asked if he can take pictures for MyChart to take a look sooner. I think we would need to follow up to discuss before starting hydroxychloroquine since he has some side effect risk factors. Hydroxyzine could also be tried for symptoms if rash looks more typical of lupus.

## 2021-12-29 NOTE — Telephone Encounter (Signed)
Requested Prescriptions  Pending Prescriptions Disp Refills  . amLODipine (NORVASC) 10 MG tablet 30 tablet 0    Sig: TAKE 1 TABLET BY MOUTH AT BEDTIME     Cardiovascular: Calcium Channel Blockers 2 Failed - 12/29/2021  1:34 PM      Failed - Last BP in normal range    BP Readings from Last 1 Encounters:  05/07/21 (!) 148/108         Failed - Valid encounter within last 6 months    Recent Outpatient Visits          1 year ago Primary hypertension   [place], Vernia Buff, NP   2 years ago Essential hypertension   [place], Vernia Buff, NP   3 years ago Seizure disorder [facility])   [place], [place], MD   3 years ago Essential hypertension   [place], Vernia Buff, NP   3 years ago Prediabetes   [place], Vernia Buff, NP      Future Appointments            In 3 weeks Thereasa Solo, Dionne Bucy, PA-C [place]   In 1 month Rice, Resa Miner, MD [facility]           Passed - Last Heart Rate in normal range    Pulse Readings from Last 1 Encounters:  05/07/21 98         . lamoTRIgine (LAMICTAL) 200 MG tablet 180 tablet 4    Sig: TAKE 1 TABLET (200 MG TOTAL) BY MOUTH 2 (TWO) TIMES DAILY.     Neurology:  Anticonvulsants - lamotrigine Failed - 12/29/2021  1:34 PM      Failed - Cr in normal range and within 360 days    Creat  Date Value Ref Range Status  01/24/2021 15.72 (H) 0.60 - 1.29 mg/dL Final    Comment:    Verified by repeat analysis. .    Creatinine, Ser  Date Value Ref Range Status  05/06/2021 9.07 (H) 0.61 - 1.24 mg/dL Final   Creatinine, POC  Date Value Ref Range Status  12/10/2016 200 mg/dL Final   Creatinine, Urine  Date Value Ref Range Status  12/21/2017 86.75 mg/dL Final    Comment:    Performed at [facility], [place] [address] [place], [place] [address]  12/21/2017 87.57 mg/dL Final         Failed - AST in normal range and within 360 days    AST  Date Value Ref Range Status  05/04/2021 14 (L) 15 - 41 U/L Final         Failed - Completed PHQ-2 or PHQ-9 in the last 360 days      Passed - ALT in normal range and within 360 days    ALT  Date Value Ref Range Status  05/04/2021 15 0 - 44 U/L Final         Passed - Valid encounter within last 12 months    Recent Outpatient Visits          1 year ago Primary hypertension   [place], Vernia Buff, NP   2 years ago Essential hypertension   [place], [place] W, NP   3 years ago Seizure disorder ([place])  [place], MD   3 years ago Essential hypertension   [place], Vernia Buff, NP   3 years ago Prediabetes   [place], Vernia Buff, NP      Future Appointments            In 3 weeks Thereasa Solo, Dionne Bucy, PA-C [place]   In 1 month Rice, Resa Miner, MD [facility]

## 2022-01-04 ENCOUNTER — Other Ambulatory Visit: Payer: Self-pay

## 2022-01-09 ENCOUNTER — Other Ambulatory Visit: Payer: Self-pay

## 2022-01-18 ENCOUNTER — Encounter (HOSPITAL_COMMUNITY): Payer: Self-pay

## 2022-01-25 ENCOUNTER — Ambulatory Visit: Payer: No Typology Code available for payment source | Admitting: Physician Assistant

## 2022-02-09 NOTE — Progress Notes (Deleted)
Office Visit Note  Patient: [name] [name]             Date of Birth: [birth year]           MRN: [id number]             PCP: Gildardo Pounds, NP Referring: Gildardo Pounds, NP Visit Date: 02/14/2022   Subjective:  No chief complaint on file.   History of Present Illness: [name] [name] is a 41 y.o. male here for follow up for systemic lupus erythematosus   Previous HPI 01/24/2022 [name] [name] is a 41 y.o. male here for systemic lupus erythematosus. 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  He was recently diagnosed more than a decade ago and underwent induction treatment with high-dose glucocorticoids and was on mycophenolate previously.  He saw [facility]ology for evaluation of extrarenal disease without a lot of systemic symptoms though he had 2 previous strokes with subsequent encephalomalacia and seizures followed by neurology.  He is on apixaban for long-term anticoagulation apparently previous positives lupus anticoagulant test not confirmed on repeat testing.  He is now on hemodialysis through a left arm arteriovenous fistula access.  He has been off any specific lupus treatment or immunosuppression for some time, previous records indicate hydroxychloroquine prescription but patient denies taking this medicine last year.  Currently he is feeling overall well.  He has reported some type of skin rashes not clear whether exactly photosensitive or not.  He has significant dryness and pruritus worsens with heat exposure.  Currently denies alopecia, oral ulcers, lymphadenopathy, fevers, pleurisy, joint pains, Raynaud's symptoms.   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   Activities of Daily Living:  Patient reports morning stiffness for 0 minutes.   Patient Denies nocturnal pain.  Difficulty dressing/grooming: Denies Difficulty climbing stairs: Reports Difficulty getting out of chair: Denies Difficulty using hands for taps, buttons, cutlery, and/or writing: Denies   No Rheumatology ROS completed.   PMFS History:  Patient Active Problem List   Diagnosis Date Noted   Long-term current use of anticonvulsant 08/04/2020   COVID-19 07/26/2020   Breakthrough seizure ([place]) 07/20/2020   Acute respiratory failure with hypoxia (HCC)    Lupus (HCC)    Seizure, late effect of stroke ([place]) 08/26/2019   Secondary hyperparathyroidism of renal origin ([place]) 11/05/2018   Anemia in chronic kidney disease 09/23/2018   ESRD (end stage renal disease) ([place]) 03/18/2018   Prediabetes 01/10/2018   Chest pain in adult    CKD (chronic kidney disease) stage 3, GFR 30-59 ml/min (HCC) 10/24/2015   Seizures ([place]) 10/24/2015   HTN (hypertension) 01/12/2015   Chronic anticoagulation 04/24/2011   Lupus anticoagulant positive 04/24/2011   OBESITY, NOS 08/15/2006   THROMBOCYTOPENIA 08/15/2006   TOBACCO DEPENDENCE 08/15/2006   CVA 08/15/2006    Past Medical History:  Diagnosis Date   Chronic kidney disease    ESRD (end stage renal disease) ([place])    Hypertension    Lupus ([place])    Seizures ([place])    Seizures ([place])    Stroke ([place])    22 or 41 years  of age.     Wears glasses     Family History  Problem Relation Age of Onset   Diabetes Mother    Diabetes Maternal Grandmother    Seizures Neg Hx        not on mother's side. he doesn't know about his father's side   Past Surgical History:  Procedure Laterality Date   AV FISTULA PLACEMENT Left 12/30/2017   Procedure: ARTERIOVENOUS (AV) FISTULA CREATION VERSUS INSERTION OF ARTERIOVENOUS GRAFT LEFT ARM;  Surgeon: Rosetta Posner, MD;  Location: [place];  Service: Vascular;   Laterality: Left;   [place] Left 08/15/2018   Procedure: LEFT FIRST STAGE [place];  Surgeon: Rosetta Posner, MD;  Location: [place];  Service: Vascular;  Laterality: Left;   [place] Left 10/30/2018   Procedure: INSERTION OF GORE-TEX GRAFT LEFT UPPER ARM;  Surgeon: Waynetta Sandy, MD;  Location: [place];  Service: Vascular;  Laterality: Left;   INSERTION OF DIALYSIS CATHETER N/A 09/20/2018   Procedure: INSERTION OF TUNNELED DIALYSIS CATHETER RIGHT INTERNAL JUGULAR;  Surgeon: Marty Heck, MD;  Location: [place];  Service: Vascular;  Laterality: N/A;   RENAL BIOPSY     Social History   Social History Narrative   Lives with a friend   Right handed   Caffeine: rare   Immunization History  Administered Date(s) Administered   Hepatitis B, adult 10/29/2018, 11/26/2018, 12/24/2018, 05/01/2019   Influenza,inj,Quad PF,6+ Mos 07/11/2017, 07/19/2017, 07/07/2019, 04/25/2020   PFIZER(Purple Top)SARS-COV-2 Vaccination 02/19/2020, 03/14/2020   Pneumococcal Conjugate-13 01/14/2019   Pneumococcal Polysaccharide-23 12/22/2017, 03/18/2019   Tdap 01/12/2015     Objective: Vital Signs: There were no vitals taken for this visit.   Physical Exam   Musculoskeletal Exam: ***  CDAI Exam: CDAI Score: -- Patient Global: --; Provider Global: -- Swollen: --; Tender: -- Joint Exam 02/14/2022   No joint exam has been documented for this visit   There is currently no information documented on the homunculus. Go to the Rheumatology activity and complete the homunculus joint exam.  Investigation: No additional findings.  Imaging: No results found.  Recent Labs: Lab Results  Component Value Date   WBC 6.8 05/06/2021   HGB 10.3 (L) 05/06/2021   PLT 120 (L) 05/06/2021   NA 136 05/06/2021   K 3.9 05/06/2021   CL 96 (L) 05/06/2021   CO2 25 05/06/2021   GLUCOSE 97 05/06/2021   BUN 27 (H) 05/06/2021   CREATININE 9.07 (H) 05/06/2021    BILITOT 0.6 05/04/2021   ALKPHOS 76 05/04/2021   AST 14 (L) 05/04/2021   ALT 15 05/04/2021   PROT 7.8 05/04/2021   ALBUMIN 3.1 (L) 05/06/2021   CALCIUM 9.1 05/06/2021   GFRAA 4 (L) 01/14/2020    Speciality Comments: No specialty comments available.  Procedures:  No procedures performed Allergies: Zyrtec [cetirizine]   Assessment / Plan:     Visit Diagnoses: No diagnosis found.  ***  Orders: No orders of the defined types were placed in this encounter.  No orders of the defined types were placed in this encounter.    Follow-Up Instructions: No follow-ups on file.   Bertram Savin, RT  Note - This record has been created using Editor, commissioning.  Chart creation errors have been sought, but may not always  have been located. Such creation errors do not reflect on  the standard of medical care.

## 2022-02-14 ENCOUNTER — Ambulatory Visit: Payer: No Typology Code available for payment source | Admitting: Internal Medicine

## 2022-02-14 ENCOUNTER — Ambulatory Visit: Payer: Self-pay | Admitting: Physician Assistant

## 2022-02-14 DIAGNOSIS — M329 Systemic lupus erythematosus, unspecified: Secondary | ICD-10-CM

## 2022-02-14 DIAGNOSIS — Z7901 Long term (current) use of anticoagulants: Secondary | ICD-10-CM

## 2022-02-14 DIAGNOSIS — N186 End stage renal disease: Secondary | ICD-10-CM

## 2022-02-14 DIAGNOSIS — R760 Raised antibody titer: Secondary | ICD-10-CM

## 2022-02-14 DIAGNOSIS — D696 Thrombocytopenia, unspecified: Secondary | ICD-10-CM

## 2022-02-14 NOTE — Progress Notes (Deleted)
Office Visit Note  Patient: [name] [name]             Date of Birth: 1980-07-24           MRN: [id number]             PCP: Gildardo Pounds, NP Referring: Gildardo Pounds, NP Visit Date: 02/20/2022   Subjective:  No chief complaint on file.   History of Present Illness: [name] [name] is a 41 y.o. male here for follow up systemic lupus erythematosus.   Previous HPI 01/24/2022 [name] [name] is a 41 y.o. male here for systemic lupus erythematosus. 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  He was recently diagnosed more than a decade ago and underwent induction treatment with high-dose glucocorticoids and was on mycophenolate previously.  He saw [facility]ology for evaluation of extrarenal disease without a lot of systemic symptoms though he had 2 previous strokes with subsequent encephalomalacia and seizures followed by neurology.  He is on apixaban for long-term anticoagulation apparently previous positives lupus anticoagulant test not confirmed on repeat testing.  He is now on hemodialysis through a left arm arteriovenous fistula access.  He has been off any specific lupus treatment or immunosuppression for some time, previous records indicate hydroxychloroquine prescription but patient denies taking this medicine last year.  Currently he is feeling overall well.  He has reported some type of skin rashes not clear whether exactly photosensitive or not.  He has significant dryness and pruritus worsens with heat exposure.  Currently denies alopecia, oral ulcers, lymphadenopathy, fevers, pleurisy, joint pains, Raynaud's symptoms.   Lupus manifestations Class III/IV nephritis - 2010 biospy CVA x2 Skin rashes   DMARD Hx Prednisone MMF HCQ     Labs reviewed 07/2020 HBV/HCV neg   08/2019 [name], RNP, Ro, La neg CBC Plts 93 Complement C3 C4 wnl ACA neg B2GP1 neg   12/02/19 MRI Brain IMPRESSION:  Severe chronic small vessel ischemic disease as well as  multiple old cortical and lacunar infarcts.   Activities of Daily Living:  Patient reports morning stiffness for 0 minutes.   Patient Denies nocturnal pain.  Difficulty dressing/grooming: Denies Difficulty climbing stairs: Reports Difficulty getting out of chair: Denies Difficulty using hands for taps, buttons, cutlery, and/or writing: Denies   No Rheumatology ROS completed.   PMFS History:  Patient Active Problem List   Diagnosis Date Noted   Long-term current use of anticonvulsant 08/04/2020   COVID-19 07/26/2020   Breakthrough seizure ([place]) 07/20/2020   Acute respiratory failure with hypoxia (HCC)    Lupus (HCC)    Seizure, late effect of stroke ([place]) 08/26/2019   Secondary hyperparathyroidism of renal origin ([place]) 11/05/2018   Anemia in chronic kidney disease 09/23/2018   ESRD (end stage renal disease) ([place]) 03/18/2018   Prediabetes 01/10/2018   Chest pain in adult    CKD (chronic kidney disease) stage 3, GFR 30-59 ml/min (HCC) 10/24/2015   Seizures ([place]) 10/24/2015   HTN (hypertension) 01/12/2015   Chronic anticoagulation 04/24/2011   Lupus anticoagulant positive 04/24/2011   OBESITY, NOS 08/15/2006   THROMBOCYTOPENIA 08/15/2006   TOBACCO DEPENDENCE 08/15/2006   CVA 08/15/2006    Past Medical History:  Diagnosis Date   Chronic kidney disease    ESRD (end stage renal disease) ([place])    Hypertension    Lupus ([place])    Seizures ([place])    Seizures ([place])    Stroke ([place])    22 or 41 years of  age.     Wears glasses     Family History  Problem Relation Age of Onset   Diabetes Mother    Diabetes Maternal Grandmother    Seizures Neg Hx        not on mother's side. he doesn't know about his father's side   Past Surgical History:  Procedure Laterality Date   AV FISTULA PLACEMENT Left 12/30/2017   Procedure: ARTERIOVENOUS (AV) FISTULA CREATION VERSUS INSERTION OF ARTERIOVENOUS GRAFT LEFT ARM;  Surgeon: Rosetta Posner, MD;  Location: [facility];  Service: Vascular;   Laterality: Left;   [place] Left 08/15/2018   Procedure: LEFT FIRST STAGE [place];  Surgeon: Rosetta Posner, MD;  Location: [facility];  Service: Vascular;  Laterality: Left;   [place] Left 10/30/2018   Procedure: INSERTION OF GORE-TEX GRAFT LEFT UPPER ARM;  Surgeon: Waynetta Sandy, MD;  Location: [facility];  Service: Vascular;  Laterality: Left;   INSERTION OF DIALYSIS CATHETER N/A 09/20/2018   Procedure: INSERTION OF TUNNELED DIALYSIS CATHETER RIGHT INTERNAL JUGULAR;  Surgeon: Marty Heck, MD;  Location: [facility];  Service: Vascular;  Laterality: N/A;   RENAL BIOPSY     Social History   Social History Narrative   Lives with a friend   Right handed   Caffeine: rare   Immunization History  Administered Date(s) Administered   Hepatitis B, adult 10/29/2018, 11/26/2018, 12/24/2018, 05/01/2019   Influenza,inj,Quad PF,6+ Mos 07/11/2017, 07/19/2017, 07/07/2019, 04/25/2020   PFIZER(Purple Top)SARS-COV-2 Vaccination 02/19/2020, 03/14/2020   Pneumococcal Conjugate-13 01/14/2019   Pneumococcal Polysaccharide-23 12/22/2017, 03/18/2019   Tdap 01/12/2015     Objective: Vital Signs: There were no vitals taken for this visit.   Physical Exam   Musculoskeletal Exam: ***  CDAI Exam: CDAI Score: -- Patient Global: --; Provider Global: -- Swollen: --; Tender: -- Joint Exam 02/20/2022   No joint exam has been documented for this visit   There is currently no information documented on the homunculus. Go to the Rheumatology activity and complete the homunculus joint exam.  Investigation: No additional findings.  Imaging: No results found.  Recent Labs: Lab Results  Component Value Date   WBC 6.8 05/06/2021   HGB 10.3 (L) 05/06/2021   PLT 120 (L) 05/06/2021   NA 136 05/06/2021   K 3.9 05/06/2021   CL 96 (L) 05/06/2021   CO2 25 05/06/2021   GLUCOSE 97 05/06/2021   BUN 27 (H) 05/06/2021   CREATININE 9.07 (H) 05/06/2021    BILITOT 0.6 05/04/2021   ALKPHOS 76 05/04/2021   AST 14 (L) 05/04/2021   ALT 15 05/04/2021   PROT 7.8 05/04/2021   ALBUMIN 3.1 (L) 05/06/2021   CALCIUM 9.1 05/06/2021   GFRAA 4 (L) 01/14/2020    Speciality Comments: No specialty comments available.  Procedures:  No procedures performed Allergies: Zyrtec [cetirizine]   Assessment / Plan:     Visit Diagnoses: No diagnosis found.  ***  Orders: No orders of the defined types were placed in this encounter.  No orders of the defined types were placed in this encounter.    Follow-Up Instructions: No follow-ups on file.   Bertram Savin, RT  Note - This record has been created using Editor, commissioning.  Chart creation errors have been sought, but may not always  have been located. Such creation errors do not reflect on  the standard of medical care.

## 2022-02-15 DIAGNOSIS — Z992 Dependence on renal dialysis: Secondary | ICD-10-CM | POA: Insufficient documentation

## 2022-02-15 DIAGNOSIS — N186 End stage renal disease: Secondary | ICD-10-CM | POA: Insufficient documentation

## 2022-02-20 ENCOUNTER — Ambulatory Visit: Payer: No Typology Code available for payment source | Attending: Internal Medicine | Admitting: Internal Medicine

## 2022-02-20 DIAGNOSIS — D696 Thrombocytopenia, unspecified: Secondary | ICD-10-CM

## 2022-02-20 DIAGNOSIS — N186 End stage renal disease: Secondary | ICD-10-CM

## 2022-02-20 DIAGNOSIS — Z7901 Long term (current) use of anticoagulants: Secondary | ICD-10-CM

## 2022-02-20 DIAGNOSIS — R760 Raised antibody titer: Secondary | ICD-10-CM

## 2022-02-20 DIAGNOSIS — M329 Systemic lupus erythematosus, unspecified: Secondary | ICD-10-CM

## 2022-02-23 ENCOUNTER — Other Ambulatory Visit: Payer: Self-pay | Admitting: Nurse Practitioner

## 2022-02-23 ENCOUNTER — Other Ambulatory Visit: Payer: Self-pay

## 2022-02-23 DIAGNOSIS — I10 Essential (primary) hypertension: Secondary | ICD-10-CM

## 2022-02-27 ENCOUNTER — Other Ambulatory Visit: Payer: Self-pay

## 2022-02-28 ENCOUNTER — Other Ambulatory Visit: Payer: Self-pay

## 2022-02-28 ENCOUNTER — Other Ambulatory Visit: Payer: Self-pay | Admitting: Pharmacist

## 2022-02-28 MED ORDER — LAMOTRIGINE 200 MG PO TABS
ORAL_TABLET | Freq: Two times a day (BID) | ORAL | 0 refills | Status: DC
  Filled 2022-02-28: qty 60, 30d supply, fill #0

## 2022-03-06 ENCOUNTER — Telehealth: Payer: Self-pay | Admitting: Oncology

## 2022-03-06 NOTE — Telephone Encounter (Signed)
Scheduled appt per 9/19 referral. Pt is aware of appt date and time. Pt is aware to arrive 15 mins prior to appt time and to bring and updated insurance card. Pt is aware of appt location.   

## 2022-03-07 ENCOUNTER — Encounter: Payer: Self-pay | Admitting: Physician Assistant

## 2022-03-07 ENCOUNTER — Ambulatory Visit (INDEPENDENT_AMBULATORY_CARE_PROVIDER_SITE_OTHER): Payer: No Typology Code available for payment source | Admitting: Physician Assistant

## 2022-03-07 ENCOUNTER — Encounter (HOSPITAL_COMMUNITY): Payer: Self-pay

## 2022-03-07 VITALS — BP 117/74 | HR 102 | Temp 98.1°F | Resp 16 | Ht 72.0 in | Wt 266.0 lb

## 2022-03-07 DIAGNOSIS — I69398 Other sequelae of cerebral infarction: Secondary | ICD-10-CM

## 2022-03-07 DIAGNOSIS — N186 End stage renal disease: Secondary | ICD-10-CM | POA: Diagnosis not present

## 2022-03-07 DIAGNOSIS — I10 Essential (primary) hypertension: Secondary | ICD-10-CM

## 2022-03-07 DIAGNOSIS — Z7901 Long term (current) use of anticoagulants: Secondary | ICD-10-CM | POA: Diagnosis not present

## 2022-03-07 DIAGNOSIS — R569 Unspecified convulsions: Secondary | ICD-10-CM

## 2022-03-07 MED ORDER — LAMOTRIGINE 200 MG PO TABS: ORAL_TABLET | Freq: Two times a day (BID) | ORAL | 0 refills | Status: DC

## 2022-03-07 MED ORDER — AMLODIPINE BESYLATE 10 MG PO TABS: ORAL_TABLET | Freq: Every day | ORAL | 1 refills | Status: DC

## 2022-03-07 MED ORDER — ATORVASTATIN CALCIUM 40 MG PO TABS: 40.0000 mg | ORAL_TABLET | Freq: Every day | ORAL | 1 refills | Status: DC

## 2022-03-07 MED ORDER — APIXABAN 2.5 MG PO TABS: 2.5000 mg | ORAL_TABLET | Freq: Two times a day (BID) | ORAL | 1 refills | Status: DC

## 2022-03-07 MED ORDER — CARVEDILOL 6.25 MG PO TABS: 6.2500 mg | ORAL_TABLET | Freq: Two times a day (BID) | ORAL | 1 refills | Status: DC

## 2022-03-07 NOTE — Patient Instructions (Signed)
Make appt with NEUROLOGIST or have lamictal levels done at dialysis

## 2022-03-07 NOTE — Progress Notes (Signed)
Patient ID: [name] [name], male   DOB: Jul 07, 1980, 41 y.o.   MRN: [id number]   Manpreet Strey, is a 41 y.o. male  MPN:[id number]  MGQ:[id number]  DOB - 12/02/1980  No chief complaint on file.      Subjective:   [name] [name] is a 41 y.o. male here today for med RF.  ESRD awaiting transplant with HD on M,W,Fri.  He needs med RF.  Recent labs 8/31 and usu labs at least once weekly with HD.  1 seizure in last 6 months and describes as "mild."  He had an appt with neurology but missed it and is going to reschedule it.  No CP/SOB/dizziness  No problems updated.  ALLERGIES: Allergies  Allergen Reactions   Zyrtec [Cetirizine] Swelling    FACIAL SWELLING, NOT CATEGORIZED    PAST MEDICAL HISTORY: Past Medical History:  Diagnosis Date   Chronic kidney disease    ESRD (end stage renal disease) ([place])    Hypertension    Lupus ([place])    Seizures ([place])    Seizures ([place])    Stroke ([place])    39 or 41 years of age.     Wears glasses     MEDICATIONS AT HOME: Prior to Admission medications   Medication Sig Start Date End Date Taking? Authorizing Provider  cinacalcet (SENSIPAR) 60 MG tablet Take 60 mg by mouth daily. 02/02/21  Yes [provider]  ferric citrate (AURYXIA) 1 GM 210 MG(Fe) tablet Take 4 tablets by mouth 3 times a day with meals and 2 with snacks. Each tablet is 210 mg. 09/27/20  Yes Gildardo Pounds, NP  gabapentin (NEURONTIN) 100 MG capsule Take 100 mg by mouth at bedtime. 12/25/20  Yes [provider]  hydrOXYzine (ATARAX/VISTARIL) 25 MG tablet Take 25 mg by mouth 3 (three) times daily as needed for itching. 11/17/20  Yes [provider]  amLODipine (NORVASC) 10 MG tablet TAKE 1 TABLET BY MOUTH AT BEDTIME 03/07/22 03/07/23  Argentina Donovan, PA-C  apixaban (ELIQUIS) 2.5 MG TABS tablet Take 1 tablet (2.5 mg total) by mouth 2 (two) times daily. 03/07/22 05/06/22  Argentina Donovan, PA-C  atorvastatin (LIPITOR) 40 MG tablet Take 1 tablet (40 mg total) by mouth  daily. 03/07/22   Argentina Donovan, PA-C  carvedilol (COREG) 6.25 MG tablet Take 1 tablet (6.25 mg total) by mouth 2 (two) times daily with a meal. 03/07/22 04/06/22  Argentina Donovan, PA-C  doxycycline (VIBRAMYCIN) 100 MG capsule Take 1 capsule (100 mg total) by mouth 2 (two) times daily. Patient not taking: Reported on 03/07/2022 05/07/21   Thurnell Lose, MD  lamoTRIgine (LAMICTAL) 200 MG tablet TAKE 1 TABLET (200 MG TOTAL) BY MOUTH 2 (TWO) TIMES DAILY. 03/07/22 03/07/23  Argentina Donovan, PA-C  silver sulfADIAZINE (SILVADENE) 1 % cream Apply to affected skin areas once per day for 10 days or until area is healed Patient not taking: Reported on 07/21/2020 11/24/18   Fulp, Cammie, MD    ROS: Neg HEENT Neg resp Neg cardiac Neg GI Neg GU Neg MS Neg psych Neg neuro  Objective:   Vitals:   03/07/22 1326  BP: 117/74  Pulse: (!) 102  Resp: 16  Temp: 98.1 F (36.7 C)  TempSrc: Oral  Weight: 266 lb (120.7 kg)  Height: 6' (1.829 m)   Exam General appearance : Awake, alert, not in any distress. Speech Clear. Not toxic looking;  appears older than stated age 8: Atraumatic and Normocephalic Neck: Supple, no JVD.  No cervical lymphadenopathy.  Chest: Good air entry bilaterally, CTAB.  No rales/rhonchi/wheezing CVS: S1 S2 regular, no murmurs.  Extremities: B/L Lower Ext shows no edema, both legs are warm to touch Neurology: Awake alert, and oriented X 3, CN II-XII intact, Non focal Skin: No Rash  Data Review Lab Results  Component Value Date   HGBA1C 5.0 07/20/2020   HGBA1C 5.2 07/07/2019   HGBA1C 5.3 01/10/2018    Assessment & Plan   1. Seizure, late effect of stroke ([place]) He missed appt with neuro but is going to reschedule - lamoTRIgine (LAMICTAL) 200 MG tablet; TAKE 1 TABLET (200 MG TOTAL) BY MOUTH 2 (TWO) TIMES DAILY.  Dispense: 180 tablet; Refill: 0 - apixaban (ELIQUIS) 2.5 MG TABS tablet; Take 1 tablet (2.5 mg total) by mouth 2 (two) times daily.  Dispense: 180  tablet; Refill: 1 - amLODipine (NORVASC) 10 MG tablet; TAKE 1 TABLET BY MOUTH AT BEDTIME  Dispense: 90 tablet; Refill: 1  2. Primary hypertension At goal - carvedilol (COREG) 6.25 MG tablet; Take 1 tablet (6.25 mg total) by mouth 2 (two) times daily with a meal.  Dispense: 180 tablet; Refill: 1 - amLODipine (NORVASC) 10 MG tablet; TAKE 1 TABLET BY MOUTH AT BEDTIME  Dispense: 90 tablet; Refill: 1  3. Chronic anticoagulation - apixaban (ELIQUIS) 2.5 MG TABS tablet; Take 1 tablet (2.5 mg total) by mouth 2 (two) times daily.  Dispense: 180 tablet; Refill: 1  4. ESRD (end stage renal disease) ([place]) HD on M, W,Fri    Return in about 3 months (around 06/06/2022) for with PCP.  The patient was given clear instructions to go to ER or return to medical center if symptoms don't improve, worsen or new problems develop. The patient verbalized understanding. The patient was told to call to get lab results if they haven't heard anything in the next week.      Freeman Caldron, PA-C [facility], [place]   03/07/2022, 3:01 PM

## 2022-03-08 ENCOUNTER — Encounter: Payer: Self-pay | Admitting: Nurse Practitioner

## 2022-03-12 NOTE — Progress Notes (Deleted)
Office Visit Note  Patient: [name] [name]             Date of Birth: [birth year]           MRN: [id number]             PCP: Gildardo Pounds, NP Referring: Gildardo Pounds, NP Visit Date: 03/21/2022   Subjective:  No chief complaint on file.   History of Present Illness: [name] [name] is a 41 y.o. male here for follow up for systemic lupus erythematosus   Previous HPI 01/24/2021 [name] [name] is a 41 y.o. male here for systemic lupus erythematosus. 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  He was recently diagnosed more than a decade ago and underwent induction treatment with high-dose glucocorticoids and was on mycophenolate previously.  He saw [facility] for evaluation of extrarenal disease without a lot of systemic symptoms though he had 2 previous strokes with subsequent encephalomalacia and seizures followed by neurology.  He is on apixaban for long-term anticoagulation apparently previous positives lupus anticoagulant test not confirmed on repeat testing.  He is now on hemodialysis through a left arm arteriovenous fistula access.  He has been off any specific lupus treatment or immunosuppression for some time, previous records indicate hydroxychloroquine prescription but patient denies taking this medicine last year.  Currently he is feeling overall well.  He has reported some type of skin rashes not clear whether exactly photosensitive or not.  He has significant dryness and pruritus worsens with heat exposure.  Currently denies alopecia, oral ulcers, lymphadenopathy, fevers, pleurisy, joint pains, Raynaud's symptoms.   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   Activities of Daily Living:  Patient reports morning stiffness for 0 minutes.   Patient Denies nocturnal pain.  Difficulty dressing/grooming: Denies Difficulty climbing stairs: Reports Difficulty getting out of chair: Denies Difficulty using hands for taps, buttons, cutlery, and/or writing: Denies   No Rheumatology ROS completed.   PMFS History:  Patient Active Problem List   Diagnosis Date Noted   ESRD on hemodialysis ([place]) 02/15/2022   Other disorders of phosphorus metabolism 08/18/2021   Fluid overload, unspecified 05/10/2021   Allergy, unspecified, initial encounter 04/19/2021   Anaphylactic shock, unspecified, sequela 04/19/2021   Pruritus, unspecified 04/19/2021   Hypocalcemia 04/01/2021   Long-term current use of anticonvulsant 08/04/2020   COVID-19 07/26/2020   Breakthrough seizure ([place]) 07/20/2020   Acute respiratory failure with hypoxia (HCC)    Lupus ([place])    Seizure, late effect of stroke ([place]) 08/26/2019   Secondary hyperparathyroidism of renal origin ([place]) 11/05/2018   Anemia in chronic kidney disease 09/23/2018   ESRD (end stage renal disease) ([place]) 03/18/2018   Prediabetes 01/10/2018   Chest pain in adult    CKD (chronic kidney disease) stage 3, GFR 30-59 ml/min ([place]) 10/24/2015   Seizures ([place]) 10/24/2015   HTN (hypertension) 01/12/2015   Chronic anticoagulation 04/24/2011   Lupus anticoagulant positive 04/24/2011   OBESITY, NOS 08/15/2006   THROMBOCYTOPENIA 08/15/2006   TOBACCO DEPENDENCE 08/15/2006   CVA 08/15/2006    Past Medical History:  Diagnosis Date  Chronic kidney disease    ESRD (end stage renal disease) ([place])    Hypertension    Lupus (HCC)    Seizures (HCC)    Seizures ([place])    Stroke ([place])    4 or 41 years of age.     Wears glasses     Family History  Problem Relation Age of Onset   Diabetes Mother    Diabetes Maternal Grandmother    Seizures Neg Hx        not on mother's side.  he doesn't know about his father's side   Past Surgical History:  Procedure Laterality Date   AV FISTULA PLACEMENT Left 12/30/2017   Procedure: ARTERIOVENOUS (AV) FISTULA CREATION VERSUS INSERTION OF ARTERIOVENOUS GRAFT LEFT ARM;  Surgeon: Rosetta Posner, MD;  Location: [facility];  Service: Vascular;  Laterality: Left;   [place] Left 08/15/2018   Procedure: LEFT FIRST STAGE [place];  Surgeon: Rosetta Posner, MD;  Location: [place];  Service: Vascular;  Laterality: Left;   [place] Left 10/30/2018   Procedure: INSERTION OF GORE-TEX GRAFT LEFT UPPER ARM;  Surgeon: Waynetta Sandy, MD;  Location: [facility];  Service: Vascular;  Laterality: Left;   INSERTION OF DIALYSIS CATHETER N/A 09/20/2018   Procedure: INSERTION OF TUNNELED DIALYSIS CATHETER RIGHT INTERNAL JUGULAR;  Surgeon: Marty Heck, MD;  Location: [facility];  Service: Vascular;  Laterality: N/A;   RENAL BIOPSY     Social History   Social History Narrative   Lives with a friend   Right handed   Caffeine: rare   Immunization History  Administered Date(s) Administered   Hepatitis B, adult 10/29/2018, 11/26/2018, 12/24/2018, 05/01/2019   Influenza,inj,Quad PF,6+ Mos 07/11/2017, 07/19/2017, 07/07/2019, 04/25/2020   PFIZER(Purple Top)SARS-COV-2 Vaccination 02/19/2020, 03/14/2020   Pneumococcal Conjugate-13 01/14/2019   Pneumococcal Polysaccharide-23 12/22/2017, 03/18/2019   Tdap 01/12/2015     Objective: Vital Signs: There were no vitals taken for this visit.   Physical Exam   Musculoskeletal Exam: ***  CDAI Exam: CDAI Score: -- Patient Global: --; Provider Global: -- Swollen: --; Tender: -- Joint Exam 03/21/2022   No joint exam has been documented for this visit   There is currently no information documented on the homunculus. Go to the Rheumatology activity and complete the homunculus joint exam.  Investigation: No additional findings.  Imaging: No results  found.  Recent Labs: Lab Results  Component Value Date   WBC 6.8 05/06/2021   HGB 10.3 (L) 05/06/2021   PLT 120 (L) 05/06/2021   NA 136 05/06/2021   K 3.9 05/06/2021   CL 96 (L) 05/06/2021   CO2 25 05/06/2021   GLUCOSE 97 05/06/2021   BUN 27 (H) 05/06/2021   CREATININE 9.07 (H) 05/06/2021   BILITOT 0.6 05/04/2021   ALKPHOS 76 05/04/2021   AST 14 (L) 05/04/2021   ALT 15 05/04/2021   PROT 7.8 05/04/2021   ALBUMIN 3.1 (L) 05/06/2021   CALCIUM 9.1 05/06/2021   GFRAA 4 (L) 01/14/2020    Speciality Comments: No specialty comments available.  Procedures:  No procedures performed Allergies: Zyrtec [cetirizine]   Assessment / Plan:     Visit Diagnoses: No diagnosis found.  ***  Orders: No orders of the defined types were placed in this encounter.  No orders of the defined types were placed in this encounter.    Follow-Up Instructions: No follow-ups on file.   Bertram Savin, RT  Note - This record has been created using Editor, commissioning.  Chart creation errors have been sought, but may not always  have been located. Such creation errors do not reflect on  the standard of medical care.

## 2022-03-21 ENCOUNTER — Ambulatory Visit: Payer: No Typology Code available for payment source | Attending: Internal Medicine | Admitting: Internal Medicine

## 2022-03-21 DIAGNOSIS — D696 Thrombocytopenia, unspecified: Secondary | ICD-10-CM

## 2022-03-21 DIAGNOSIS — Z7901 Long term (current) use of anticoagulants: Secondary | ICD-10-CM

## 2022-03-21 DIAGNOSIS — N186 End stage renal disease: Secondary | ICD-10-CM

## 2022-03-21 DIAGNOSIS — M329 Systemic lupus erythematosus, unspecified: Secondary | ICD-10-CM

## 2022-03-21 DIAGNOSIS — R760 Raised antibody titer: Secondary | ICD-10-CM

## 2022-03-27 ENCOUNTER — Inpatient Hospital Stay: Payer: No Typology Code available for payment source | Attending: Oncology | Admitting: Oncology

## 2022-03-30 ENCOUNTER — Other Ambulatory Visit: Payer: Self-pay | Admitting: Nurse Practitioner

## 2022-03-30 ENCOUNTER — Other Ambulatory Visit: Payer: Self-pay

## 2022-04-02 ENCOUNTER — Other Ambulatory Visit: Payer: Self-pay

## 2022-05-28 ENCOUNTER — Ambulatory Visit
Admission: EM | Admit: 2022-05-28 | Discharge: 2022-05-28 | Disposition: A | Discharge status: Home / Self Care | Payer: No Typology Code available for payment source

## 2022-05-28 ENCOUNTER — Encounter: Payer: Self-pay | Admitting: Emergency Medicine

## 2022-05-28 DIAGNOSIS — R7981 Abnormal blood-gas level: Secondary | ICD-10-CM | POA: Diagnosis not present

## 2022-05-28 DIAGNOSIS — R053 Chronic cough: Secondary | ICD-10-CM

## 2022-05-28 NOTE — ED Notes (Signed)
Patient is being discharged from the Urgent Care and sent to the Emergency Department via POV . Per Oswaldo Conroy NP, patient is in need of higher level of care due to low oxygen . Patient is aware and verbalizes understanding of plan of care.  Vitals:   05/28/22 1848 05/28/22 1849  BP: (!) 133/91   Pulse: 85   Resp: 16   Temp: 98.2 F (36.8 C)   SpO2:  92%

## 2022-05-28 NOTE — ED Triage Notes (Signed)
Pt is present today with concerns for a cough that started x3 months ago

## 2022-05-28 NOTE — Discharge Instructions (Signed)
Go straight to the emergency department as soon as you leave urgent care for further evaluation and management. 

## 2022-05-28 NOTE — ED Provider Notes (Signed)
[facility]    CSN: [id number] Arrival date & time: 05/28/22  1744      History   Chief Complaint Chief Complaint  Patient presents with   Cough    HPI [name] [name] is a 41 y.o. male.   Patient presents with persistent cough that has been present for 3 months.  Patient reports that it is sometimes a dry nonproductive cough but sometimes is a productive cough.  He denies any associated upper respiratory symptoms or fever.  Denies chest pain or shortness of breat. denies any known sick contacts.  Patient denies formal diagnosis of asthma or COPD.  He used to smoke cigarettes but stopped multiple years ago.   Cough   Past Medical History:  Diagnosis Date   Chronic kidney disease    ESRD (end stage renal disease) ([place])    Hypertension    Lupus ([place])    Seizures ([place])    Seizures ([place])    Stroke ([place])    40 or 41 years of age.     Wears glasses     Patient Active Problem List   Diagnosis Date Noted   ESRD on hemodialysis ([place]) 02/15/2022   Other disorders of phosphorus metabolism 08/18/2021   Fluid overload, unspecified 05/10/2021   Allergy, unspecified, initial encounter 04/19/2021   Anaphylactic shock, unspecified, sequela 04/19/2021   Pruritus, unspecified 04/19/2021   Hypocalcemia 04/01/2021   Long-term current use of anticonvulsant 08/04/2020   COVID-19 07/26/2020   Breakthrough seizure ([place]) 07/20/2020   Acute respiratory failure with hypoxia (HCC)    Lupus ([place])    Seizure, late effect of stroke ([place]) 08/26/2019   Secondary hyperparathyroidism of renal origin ([place]) 11/05/2018   Anemia in chronic kidney disease 09/23/2018   ESRD (end stage renal disease) ([place]) 03/18/2018   Prediabetes 01/10/2018   Chest pain in adult    CKD (chronic kidney disease) stage 3, GFR 30-59 ml/min (HCC) 10/24/2015   Seizures ([place]) 10/24/2015   HTN (hypertension) 01/12/2015   Chronic anticoagulation 04/24/2011   Lupus anticoagulant positive 04/24/2011   OBESITY,  NOS 08/15/2006   THROMBOCYTOPENIA 08/15/2006   TOBACCO DEPENDENCE 08/15/2006   CVA 08/15/2006    Past Surgical History:  Procedure Laterality Date   AV FISTULA PLACEMENT Left 12/30/2017   Procedure: ARTERIOVENOUS (AV) FISTULA CREATION VERSUS INSERTION OF ARTERIOVENOUS GRAFT LEFT ARM;  Surgeon: Rosetta Posner, MD;  Location: [facility];  Service: Vascular;  Laterality: Left;   [place] Left 08/15/2018   Procedure: LEFT FIRST STAGE [place];  Surgeon: Rosetta Posner, MD;  Location: [facility];  Service: Vascular;  Laterality: Left;   [place] Left 10/30/2018   Procedure: INSERTION OF GORE-TEX GRAFT LEFT UPPER ARM;  Surgeon: Waynetta Sandy, MD;  Location: [facility];  Service: Vascular;  Laterality: Left;   INSERTION OF DIALYSIS CATHETER N/A 09/20/2018   Procedure: INSERTION OF TUNNELED DIALYSIS CATHETER RIGHT INTERNAL JUGULAR;  Surgeon: Marty Heck, MD;  [facility];  Service: Vascular;  Laterality: N/A;   RENAL BIOPSY         Home Medications    Prior to Admission medications   Medication Sig Start Date End Date Taking? Authorizing Provider  amLODipine (NORVASC) 10 MG tablet TAKE 1 TABLET BY MOUTH AT BEDTIME 03/07/22 03/07/23  Argentina Donovan, PA-C  apixaban (ELIQUIS) 2.5 MG TABS tablet Take 1 tablet (2.5 mg total) by mouth 2 (two) times daily. 03/07/22 05/06/22  Argentina Donovan, PA-C  atorvastatin (LIPITOR)  40 MG tablet Take 1 tablet (40 mg total) by mouth daily. 03/07/22   Argentina Donovan, PA-C  carvedilol (COREG) 6.25 MG tablet Take 1 tablet (6.25 mg total) by mouth 2 (two) times daily with a meal. 03/07/22 04/06/22  Argentina Donovan, PA-C  cinacalcet (SENSIPAR) 60 MG tablet Take 60 mg by mouth daily. 02/02/21   [provider]  doxycycline (VIBRAMYCIN) 100 MG capsule Take 1 capsule (100 mg total) by mouth 2 (two) times daily. Patient not taking: Reported on 03/07/2022 05/07/21   Thurnell Lose, MD  ferric  citrate (AURYXIA) 1 GM 210 MG(Fe) tablet Take 4 tablets by mouth 3 times a day with meals and 2 with snacks. Each tablet is 210 mg. 09/27/20   Gildardo Pounds, NP  gabapentin (NEURONTIN) 100 MG capsule Take 100 mg by mouth at bedtime. 12/25/20   [provider]  hydrOXYzine (ATARAX/VISTARIL) 25 MG tablet Take 25 mg by mouth 3 (three) times daily as needed for itching. 11/17/20   [provider]  lamoTRIgine (LAMICTAL) 200 MG tablet TAKE 1 TABLET (200 MG TOTAL) BY MOUTH 2 (TWO) TIMES DAILY. 03/07/22 03/07/23  Argentina Donovan, PA-C  silver sulfADIAZINE (SILVADENE) 1 % cream Apply to affected skin areas once per day for 10 days or until area is healed Patient not taking: Reported on 07/21/2020 11/24/18   Antony Blackbird, MD    Family History Family History  Problem Relation Age of Onset   Diabetes Mother    Diabetes Maternal Grandmother    Seizures Neg Hx        not on mother's side. he doesn't know about his father's side    Social History Social History   Tobacco Use   Smoking status: Former    Packs/day: 0.25    Types: Cigarettes    Quit date: 12/21/2017    Years since quitting: 4.4   Smokeless tobacco: Never  Vaping Use   Vaping Use: Never used  Substance Use Topics   Alcohol use: Not Currently   Drug use: No     Allergies   Zyrtec [cetirizine]   Review of Systems Review of Systems Per HPI  Physical Exam Triage Vital Signs ED Triage Vitals  Enc Vitals Group     BP 05/28/22 1848 (!) 133/91     Pulse Rate 05/28/22 1848 85     Resp 05/28/22 1848 16     Temp 05/28/22 1848 98.2 F (36.8 C)     Temp src --      SpO2 05/28/22 1849 92 %     Weight --      Height --      Head Circumference --      Peak Flow --      Pain Score 05/28/22 1847 0     Pain Loc --      Pain Edu? --      Excl. in [place]? --    No data found.  Updated Vital Signs BP (!) 133/91   Pulse 85   Temp 98.2 F (36.8 C)   Resp 16   SpO2 92%   Visual Acuity Right Eye Distance:   Left  Eye Distance:   Bilateral Distance:    Right Eye Near:   Left Eye Near:    Bilateral Near:     Physical Exam Constitutional:      General: He is not in acute distress.    Appearance: Normal appearance. He is not toxic-appearing or diaphoretic.  HENT:  Head: Normocephalic and atraumatic.  Cardiovascular:     Rate and Rhythm: Normal rate and regular rhythm.     Pulses: Normal pulses.     Heart sounds: Normal heart sounds.  Pulmonary:     Effort: Pulmonary effort is normal. No respiratory distress.     Breath sounds: Normal breath sounds. No stridor. No wheezing, rhonchi or rales.  Neurological:     General: No focal deficit present.     Mental Status: He is alert and oriented to person, place, and time. Mental status is at baseline.  Psychiatric:        Mood and Affect: Mood normal.        Behavior: Behavior normal.        Thought Content: Thought content normal.        Judgment: Judgment normal.      UC Treatments / Results  Labs (all labs ordered are listed, but only abnormal results are displayed) Labs Reviewed - No data to display  EKG   Radiology No results found.  Procedures Procedures (including critical care time)  Medications Ordered in UC Medications - No data to display  Initial Impression / Assessment and Plan / UC Course  I have reviewed the triage vital signs and the nursing notes.  Pertinent labs & imaging results that were available during my care of the patient were reviewed by me and considered in my medical decision making (see chart for details).     Called to physical exam room by nursing staff given the patient's oxygen saturation was in the low 90s.  Recheck was 88 to 92%.  Given this, patient was advised that he will need to go to the hospital for further evaluation and management.  Patient was agreeable with plan.  Suggested EMS transport but patient declined.  Risks associated with not going by EMS were discussed with patient.   Patient voiced understanding and wishes self transport.  Patient left via self transport to go to the ER.  Advised patient to go straight to the ER. Final Clinical Impressions(s) / UC Diagnoses   Final diagnoses:  Low oxygen saturation  Persistent cough for 3 weeks or longer     Discharge Instructions      Go straight to the emergency department as soon as you leave urgent care for further evaluation and management.   ED Prescriptions   None    PDMP not reviewed this encounter.   Teodora Medici, [place] 05/28/22 1900

## 2022-05-30 ENCOUNTER — Encounter (HOSPITAL_COMMUNITY): Payer: Self-pay

## 2022-06-06 ENCOUNTER — Ambulatory Visit: Payer: No Typology Code available for payment source | Admitting: Physician Assistant

## 2022-06-06 ENCOUNTER — Ambulatory Visit: Payer: No Typology Code available for payment source | Admitting: Nurse Practitioner

## 2022-06-15 ENCOUNTER — Ambulatory Visit (HOSPITAL_COMMUNITY)
Admission: EM | Admit: 2022-06-15 | Discharge: 2022-06-15 | Disposition: A | Discharge status: Home / Self Care | Payer: No Typology Code available for payment source | Attending: Nurse Practitioner | Admitting: Nurse Practitioner

## 2022-06-15 ENCOUNTER — Encounter (HOSPITAL_COMMUNITY): Payer: Self-pay

## 2022-06-15 DIAGNOSIS — J40 Bronchitis, not specified as acute or chronic: Secondary | ICD-10-CM

## 2022-06-15 MED ORDER — BENZONATATE 200 MG PO CAPS: 200.0000 mg | ORAL_CAPSULE | Freq: Three times a day (TID) | ORAL | 0 refills | Status: DC | PRN

## 2022-06-15 MED ORDER — MUCINEX DM MAXIMUM STRENGTH 60-1200 MG PO TB12: 1.0000 | ORAL_TABLET | Freq: Two times a day (BID) | ORAL | 0 refills | Status: DC

## 2022-06-15 MED ORDER — AMOXICILLIN 500 MG PO TABS: 500.0000 mg | ORAL_TABLET | Freq: Two times a day (BID) | ORAL | 0 refills | Status: DC

## 2022-06-15 MED ORDER — PREDNISONE 10 MG (21) PO TBPK: ORAL_TABLET | ORAL | 0 refills | Status: DC

## 2022-06-15 NOTE — ED Triage Notes (Signed)
Cough that started 2 months ago, with headache and dizziness when coughs. Taking cough drops and dyquil to help.

## 2022-06-15 NOTE — Discharge Instructions (Addendum)
Acute bronchitis is when air tubes in the lungs suddenly get swollen.  A cough caused by bronchitis may last up to 3 weeks or even longer.   Take medicines as prescribed.  Use an vaporizer or humidifier at night  Try two teaspoons (10 mL) of honey at bedtime. This helps lessen your coughing at night. Get a lot of rest. Change your toothbrush   Go to the ED immediately if:  You cough up blood. You have chest pain. You have very bad shortness of breath. You faint or keep feeling like you are going to faint. You have a very bad headache. Your fever or chills get worse.

## 2022-06-15 NOTE — ED Provider Notes (Signed)
[place]    CSN: [id number] Arrival date & time: 06/15/22  1517      History   Chief Complaint Chief Complaint  Patient presents with   Cough   Dizziness    HPI [name] [name] is a 41 y.o. male.   Subjective:   [name] [name] is a 41 y.o. male here for evaluation of a cough.  The cough is without wheezing, dyspnea or hemoptysis, productive of clear sputum and is aggravated by nothing. Onset of symptoms was a few months ago and has been unchanged since that time.  Patient reports that the coughing episodes will sometimes cause him dizziness and chest tightness.  Patient does not have a history of asthma. Patient has not had recent travel. Patient does not have a history of smoking. Patient he denies any fevers, chills, body aches, runny nose, nasal congestion, sore throat, wheezing or shortness of breath.  Over-the-counter cough syrup and cough drops have been helpful with his symptoms but only provides temporary relief.  Patient denies smoking or vaping.  No history of lung disease.  The following portions of the patient's history were reviewed and updated as appropriate: allergies, current medications, past family history, past medical history, past social history, past surgical history, and problem list.       Past Medical History:  Diagnosis Date   Chronic kidney disease    ESRD (end stage renal disease) ([place])    Hypertension    Lupus ([place])    Seizures ([place])    Seizures ([place])    Stroke ([place])    58 or 41 years of age.     Wears glasses     Patient Active Problem List   Diagnosis Date Noted   ESRD on hemodialysis ([place]) 02/15/2022   Other disorders of phosphorus metabolism 08/18/2021   Fluid overload, unspecified 05/10/2021   Allergy, unspecified, initial encounter 04/19/2021   Anaphylactic shock, unspecified, sequela 04/19/2021   Pruritus, unspecified 04/19/2021   Hypocalcemia 04/01/2021   Long-term current use of anticonvulsant 08/04/2020    COVID-19 07/26/2020   Breakthrough seizure ([place]) 07/20/2020   Acute respiratory failure with hypoxia (HCC)    Lupus ([place])    Seizure, late effect of stroke ([place]) 08/26/2019   Secondary hyperparathyroidism of renal origin ([place]) 11/05/2018   Anemia in chronic kidney disease 09/23/2018   ESRD (end stage renal disease) ([place]) 03/18/2018   Prediabetes 01/10/2018   Chest pain in adult    CKD (chronic kidney disease) stage 3, GFR 30-59 ml/min (HCC) 10/24/2015   Seizures ([place]) 10/24/2015   HTN (hypertension) 01/12/2015   Chronic anticoagulation 04/24/2011   Lupus anticoagulant positive 04/24/2011   OBESITY, NOS 08/15/2006   THROMBOCYTOPENIA 08/15/2006   TOBACCO DEPENDENCE 08/15/2006   CVA 08/15/2006    Past Surgical History:  Procedure Laterality Date   AV FISTULA PLACEMENT Left 12/30/2017   Procedure: ARTERIOVENOUS (AV) FISTULA CREATION VERSUS INSERTION OF ARTERIOVENOUS GRAFT LEFT ARM;  Surgeon: Rosetta Posner, MD;  Location: [facility];  Service: Vascular;  Laterality: Left;   [place] Left 08/15/2018   Procedure: LEFT FIRST STAGE [place];  Surgeon: Rosetta Posner, MD;  Location: [facility];  Service: Vascular;  Laterality: Left;   [place] Left 10/30/2018   Procedure: INSERTION OF GORE-TEX GRAFT LEFT UPPER ARM;  Surgeon: Waynetta Sandy, MD;  Location: [facility];  Service: Vascular;  Laterality: Left;   INSERTION OF DIALYSIS CATHETER N/A 09/20/2018   Procedure: INSERTION OF TUNNELED DIALYSIS  CATHETER RIGHT INTERNAL JUGULAR;  Surgeon: Marty Heck, MD;  Location: [facility];  Service: Vascular;  Laterality: N/A;   RENAL BIOPSY         Home Medications    Prior to Admission medications   Medication Sig Start Date End Date Taking? Authorizing Provider  amLODipine (NORVASC) 10 MG tablet TAKE 1 TABLET BY MOUTH AT BEDTIME 03/07/22 03/07/23 Yes McClung, Angela M, PA-C  amoxicillin (AMOXIL) 500 MG tablet Take 1 tablet (500 mg total) by mouth  2 (two) times daily. 06/15/22  Yes Enrique Sack, FNP  atorvastatin (LIPITOR) 40 MG tablet Take 1 tablet (40 mg total) by mouth daily. 03/07/22  Yes McClung, Angela M, PA-C  benzonatate (TESSALON) 200 MG capsule Take 1 capsule (200 mg total) by mouth 3 (three) times daily as needed for cough. 06/15/22  Yes Enrique Sack, FNP  cinacalcet (SENSIPAR) 60 MG tablet Take 60 mg by mouth daily. 02/02/21  Yes [provider]  Dextromethorphan-guaiFENesin (MUCINEX DM MAXIMUM STRENGTH) 60-1200 MG TB12 Take 1 tablet by mouth 2 (two) times daily. 06/15/22  Yes Enrique Sack, FNP  lamoTRIgine (LAMICTAL) 200 MG tablet TAKE 1 TABLET (200 MG TOTAL) BY MOUTH 2 (TWO) TIMES DAILY. 03/07/22 03/07/23 Yes McClung, Dionne Bucy, PA-C  predniSONE (STERAPRED UNI-PAK 21 TAB) 10 MG (21) TBPK tablet Take as directed 06/15/22  Yes Enrique Sack, FNP  silver sulfADIAZINE (SILVADENE) 1 % cream Apply to affected skin areas once per day for 10 days or until area is healed 11/24/18  Yes Fulp, Cammie, MD  apixaban (ELIQUIS) 2.5 MG TABS tablet Take 1 tablet (2.5 mg total) by mouth 2 (two) times daily. 03/07/22 05/06/22  Argentina Donovan, PA-C  carvedilol (COREG) 6.25 MG tablet Take 1 tablet (6.25 mg total) by mouth 2 (two) times daily with a meal. 03/07/22 04/06/22  Argentina Donovan, PA-C    Family History Family History  Problem Relation Age of Onset   Diabetes Mother    Diabetes Maternal Grandmother    Seizures Neg Hx        not on mother's side. he doesn't know about his father's side    Social History Social History   Tobacco Use   Smoking status: Former    Packs/day: 0.25    Types: Cigarettes    Quit date: 12/21/2017    Years since quitting: 4.4   Smokeless tobacco: Never  Vaping Use   Vaping Use: Never used  Substance Use Topics   Alcohol use: Not Currently   Drug use: No     Allergies   Zyrtec [cetirizine]   Review of Systems Review of Systems  Constitutional:  Negative for fever.  HENT:   Negative for congestion, rhinorrhea and sore throat.   Respiratory:  Positive for cough. Negative for shortness of breath and wheezing.   Cardiovascular:  Negative for chest pain, palpitations and leg swelling.  Gastrointestinal:  Negative for diarrhea, nausea and vomiting.  Musculoskeletal:  Negative for myalgias.  Neurological:  Positive for dizziness. Negative for syncope, weakness, numbness and headaches.  All other systems reviewed and are negative.    Physical Exam Triage Vital Signs ED Triage Vitals  Enc Vitals Group     BP 06/15/22 1817 (!) 142/95     Pulse Rate 06/15/22 1817 99     Resp 06/15/22 1817 18     Temp 06/15/22 1817 98.7 F (37.1 C)     Temp Source 06/15/22 1817 Oral     SpO2 06/15/22 1817 93 %  Weight --      Height --      Head Circumference --      Peak Flow --      Pain Score 06/15/22 1818 0     Pain Loc --      Pain Edu? --      Excl. in [place]? --    No data found.  Updated Vital Signs BP (!) 142/95 (BP Location: Right Arm)   Pulse 99   Temp 98.7 F (37.1 C) (Oral)   Resp 18   SpO2 93%   Visual Acuity Right Eye Distance:   Left Eye Distance:   Bilateral Distance:    Right Eye Near:   Left Eye Near:    Bilateral Near:     Physical Exam Vitals reviewed.  Constitutional:      Appearance: Normal appearance.  HENT:     Head: Normocephalic.     Nose: Nose normal.  Eyes:     Conjunctiva/sclera: Conjunctivae normal.  Cardiovascular:     Rate and Rhythm: Normal rate and regular rhythm.     Pulses: Normal pulses.     Heart sounds: Normal heart sounds.  Pulmonary:     Effort: Pulmonary effort is normal. No respiratory distress.     Breath sounds: Normal breath sounds.  Abdominal:     Palpations: Abdomen is soft.  Musculoskeletal:        General: Normal range of motion.     Cervical back: Normal range of motion and neck supple.     Right lower leg: No edema.     Left lower leg: No edema.     Comments: Left AV fistula noted,  +bruit/thrill  Skin:    General: Skin is warm and dry.  Neurological:     General: No focal deficit present.     Mental Status: He is alert and oriented to person, place, and time.      UC Treatments / Results  Labs (all labs ordered are listed, but only abnormal results are displayed) Labs Reviewed - No data to display  EKG   Radiology No results found.  Procedures Procedures (including critical care time)  Medications Ordered in UC Medications - No data to display  Initial Impression / Assessment and Plan / UC Course  I have reviewed the triage vital signs and the nursing notes.  Pertinent labs & imaging results that were available during my care of the patient were reviewed by me and considered in my medical decision making (see chart for details).    41 year old male with history of ESRD on HD that presents with a 2 to 54-month history of cough.  No wheezing, shortness of breath or hemoptysis.  No fevers.  Patient is afebrile.  Nontoxic.  Physical exam as above.  Antibiotics, steroids and antitussives  per medication orders.  Supportive care measures discussed.  Advised to avoid exposure to tobacco smoke and fumes.   Today's evaluation has revealed no signs of a dangerous process. Discussed diagnosis with patient and/or guardian. Patient and/or guardian aware of their diagnosis, possible red flag symptoms to watch out for and need for close follow up. Patient and/or guardian understands verbal and written discharge instructions. Patient and/or guardian comfortable with plan and disposition.  Patient and/or guardian has a clear mental status at this time, good insight into illness (after discussion and teaching) and has clear judgment to make decisions regarding their care  Documentation was completed with the aid of voice recognition software. Transcription may  contain typographical errors. Final Clinical Impressions(s) / UC Diagnoses   Final diagnoses:  Bronchitis      Discharge Instructions      Acute bronchitis is when air tubes in the lungs suddenly get swollen.  A cough caused by bronchitis may last up to 3 weeks or even longer.   Take medicines as prescribed.  Use an vaporizer or humidifier at night  Try two teaspoons (10 mL) of honey at bedtime. This helps lessen your coughing at night. Get a lot of rest. Change your toothbrush   Go to the ED immediately if:  You cough up blood. You have chest pain. You have very bad shortness of breath. You faint or keep feeling like you are going to faint. You have a very bad headache. Your fever or chills get worse.     ED Prescriptions     Medication Sig Dispense Auth. Provider   amoxicillin (AMOXIL) 500 MG tablet Take 1 tablet (500 mg total) by mouth 2 (two) times daily. 14 tablet Eyva Califano, K[place], FNP   predniSONE (STERAPRED UNI-PAK 21 TAB) 10 MG (21) TBPK tablet Take as directed 21 tablet Zoua Caporaso, Aldona Bar, FNP   Dextromethorphan-guaiFENesin (MUCINEX DM MAXIMUM STRENGTH) 60-1200 MG TB12 Take 1 tablet by mouth 2 (two) times daily. 20 tablet Enrique Sack, FNP   benzonatate (TESSALON) 200 MG capsule Take 1 capsule (200 mg total) by mouth 3 (three) times daily as needed for cough. 30 capsule Enrique Sack, FNP      PDMP not reviewed this encounter.   Enrique Sack, [place] 06/15/22 [id number]

## 2022-06-25 ENCOUNTER — Other Ambulatory Visit: Payer: Self-pay

## 2022-06-25 ENCOUNTER — Emergency Department (HOSPITAL_COMMUNITY): Payer: No Typology Code available for payment source

## 2022-06-25 ENCOUNTER — Emergency Department (HOSPITAL_COMMUNITY)
Admission: EM | Admit: 2022-06-25 | Discharge: 2022-06-26 | Disposition: A | Discharge status: Home / Self Care | Payer: No Typology Code available for payment source | Attending: Emergency Medicine | Admitting: Emergency Medicine

## 2022-06-25 ENCOUNTER — Encounter (HOSPITAL_COMMUNITY): Payer: Self-pay

## 2022-06-25 DIAGNOSIS — Z1152 Encounter for screening for COVID-19: Secondary | ICD-10-CM | POA: Diagnosis not present

## 2022-06-25 DIAGNOSIS — Z8673 Personal history of transient ischemic attack (TIA), and cerebral infarction without residual deficits: Secondary | ICD-10-CM | POA: Diagnosis not present

## 2022-06-25 DIAGNOSIS — Z992 Dependence on renal dialysis: Secondary | ICD-10-CM | POA: Diagnosis not present

## 2022-06-25 DIAGNOSIS — Z7901 Long term (current) use of anticoagulants: Secondary | ICD-10-CM | POA: Insufficient documentation

## 2022-06-25 DIAGNOSIS — R059 Cough, unspecified: Secondary | ICD-10-CM | POA: Diagnosis not present

## 2022-06-25 DIAGNOSIS — M25512 Pain in left shoulder: Secondary | ICD-10-CM | POA: Diagnosis not present

## 2022-06-25 DIAGNOSIS — M549 Dorsalgia, unspecified: Secondary | ICD-10-CM | POA: Diagnosis present

## 2022-06-25 DIAGNOSIS — N186 End stage renal disease: Secondary | ICD-10-CM | POA: Diagnosis not present

## 2022-06-25 NOTE — ED Triage Notes (Signed)
Pt reports middle to lower back pain that started today. Pt reports chronic cough over the last couple months but reports he has been coughing more lately. Pt reports he feels this pain could be related to the coughing. Pt hx of dialysis and last session was today.

## 2022-06-26 ENCOUNTER — Encounter (HOSPITAL_COMMUNITY): Payer: Self-pay | Admitting: Emergency Medicine

## 2022-06-26 DIAGNOSIS — M25512 Pain in left shoulder: Secondary | ICD-10-CM | POA: Diagnosis not present

## 2022-06-26 LAB — CBC WITH DIFFERENTIAL/PLATELET
Abs Immature Granulocytes: 0.03 10*3/uL (ref 0.00–0.07)
Basophils Absolute: 0.1 10*3/uL (ref 0.0–0.1)
Basophils Relative: 1 %
Eosinophils Absolute: 0.2 10*3/uL (ref 0.0–0.5)
Eosinophils Relative: 2 %
HCT: 36.7 % — ABNORMAL LOW (ref 39.0–52.0)
Hemoglobin: 11.9 g/dL — ABNORMAL LOW (ref 13.0–17.0)
Immature Granulocytes: 0 %
Lymphocytes Relative: 13 %
Lymphs Abs: 1.3 10*3/uL (ref 0.7–4.0)
MCH: 33.1 pg (ref 26.0–34.0)
MCHC: 32.4 g/dL (ref 30.0–36.0)
MCV: 102.2 fL — ABNORMAL HIGH (ref 80.0–100.0)
Monocytes Absolute: 0.9 10*3/uL (ref 0.1–1.0)
Monocytes Relative: 9 %
Neutro Abs: 7.9 10*3/uL — ABNORMAL HIGH (ref 1.7–7.7)
Neutrophils Relative %: 75 %
Platelets: 80 10*3/uL — ABNORMAL LOW (ref 150–400)
RBC: 3.59 MIL/uL — ABNORMAL LOW (ref 4.22–5.81)
RDW: 14.1 % (ref 11.5–15.5)
WBC: 10.5 10*3/uL (ref 4.0–10.5)
nRBC: 0 % (ref 0.0–0.2)

## 2022-06-26 LAB — RESP PANEL BY RT-PCR (RSV, FLU A&B, COVID)  RVPGX2
Influenza A by PCR: NEGATIVE
Influenza B by PCR: NEGATIVE
Resp Syncytial Virus by PCR: NEGATIVE
SARS Coronavirus 2 by RT PCR: NEGATIVE

## 2022-06-26 LAB — BASIC METABOLIC PANEL
Anion gap: 16 — ABNORMAL HIGH (ref 5–15)
BUN: 51 mg/dL — ABNORMAL HIGH (ref 6–20)
CO2: 28 mmol/L (ref 22–32)
Calcium: 9.9 mg/dL (ref 8.9–10.3)
Chloride: 96 mmol/L — ABNORMAL LOW (ref 98–111)
Creatinine, Ser: 14.76 mg/dL — ABNORMAL HIGH (ref 0.61–1.24)
GFR, Estimated: 4 mL/min — ABNORMAL LOW (ref >60)
Glucose, Bld: 102 mg/dL — ABNORMAL HIGH (ref 70–99)
Potassium: 4.1 mmol/L (ref 3.5–5.1)
Sodium: 140 mmol/L (ref 135–145)

## 2022-06-26 MED ORDER — LIDOCAINE 4 % EX PTCH: 1.0000 | MEDICATED_PATCH | CUTANEOUS | 0 refills | Status: AC

## 2022-06-26 MED ORDER — CYCLOBENZAPRINE HCL 10 MG PO TABS: 10.0000 mg | ORAL_TABLET | Freq: Two times a day (BID) | ORAL | 0 refills | Status: DC | PRN

## 2022-06-26 MED ORDER — LIDOCAINE 5 % EX PTCH
1.0000 | MEDICATED_PATCH | CUTANEOUS | Status: DC
  Administered 2022-06-26: 1 via TRANSDERMAL
  Filled 2022-06-26: qty 1

## 2022-06-26 NOTE — ED Provider Notes (Signed)
[facility] DEPT Provider Note   CSN: [id number] Arrival date & time: 06/25/22  2305     History  Chief Complaint  Patient presents with   Back Pain    [name] [name] is a 42 y.o. male.  HPI   Medical history including seizures, ESRD currently on dialysis Monday Wednesday Friday, CVA currently on Eliquis presents with complaints of left upper back pain, states it started yesterday, came on suddenly, pain is worsened with movement improved with rest, he denies any paresthesias moving down his left arm, denies any recent trauma, he denies chest pain or shortness of breath, states he has been compliant with his Eliquis has not missed any doses, states that he got his dialysis yesterday without any complaints.  He also notices a slight cough, cough is been going on for last 3 months, he states that he is currently taking Augmentin and prednisone given by his primary care provider.    Home Medications Prior to Admission medications   Medication Sig Start Date End Date Taking? Authorizing Provider  cyclobenzaprine (FLEXERIL) 10 MG tablet Take 1 tablet (10 mg total) by mouth 2 (two) times daily as needed for muscle spasms. 06/26/22  Yes Marcello Fennel, PA-C  amLODipine (NORVASC) 10 MG tablet TAKE 1 TABLET BY MOUTH AT BEDTIME 03/07/22 03/07/23  Argentina Donovan, PA-C  amoxicillin (AMOXIL) 500 MG tablet Take 1 tablet (500 mg total) by mouth 2 (two) times daily. 06/15/22   Enrique Sack, FNP  apixaban (ELIQUIS) 2.5 MG TABS tablet Take 1 tablet (2.5 mg total) by mouth 2 (two) times daily. 03/07/22 05/06/22  Argentina Donovan, PA-C  atorvastatin (LIPITOR) 40 MG tablet Take 1 tablet (40 mg total) by mouth daily. 03/07/22   Argentina Donovan, PA-C  benzonatate (TESSALON) 200 MG capsule Take 1 capsule (200 mg total) by mouth 3 (three) times daily as needed for cough. 06/15/22   Enrique Sack, FNP  carvedilol (COREG) 6.25 MG tablet Take 1 tablet (6.25 mg total) by  mouth 2 (two) times daily with a meal. 03/07/22 04/06/22  Argentina Donovan, PA-C  cinacalcet (SENSIPAR) 60 MG tablet Take 60 mg by mouth daily. 02/02/21   [provider]  Dextromethorphan-guaiFENesin (MUCINEX DM MAXIMUM STRENGTH) 60-1200 MG TB12 Take 1 tablet by mouth 2 (two) times daily. 06/15/22   Enrique Sack, FNP  lamoTRIgine (LAMICTAL) 200 MG tablet TAKE 1 TABLET (200 MG TOTAL) BY MOUTH 2 (TWO) TIMES DAILY. 03/07/22 03/07/23  Argentina Donovan, PA-C  predniSONE (STERAPRED UNI-PAK 21 TAB) 10 MG (21) TBPK tablet Take as directed 06/15/22   Enrique Sack, FNP  silver sulfADIAZINE (SILVADENE) 1 % cream Apply to affected skin areas once per day for 10 days or until area is healed 11/24/18   Antony Blackbird, MD      Allergies    Zyrtec [cetirizine]    Review of Systems   Review of Systems  Constitutional:  Negative for chills and fever.  Respiratory:  Positive for cough. Negative for shortness of breath.   Cardiovascular:  Negative for chest pain.  Gastrointestinal:  Negative for abdominal pain.  Musculoskeletal:  Positive for back pain.  Neurological:  Negative for headaches.    Physical Exam Updated Vital Signs BP 114/72   Pulse 93   Temp 98 F (36.7 C)   Resp 18   Ht 6' (1.829 m)   Wt 127 kg   SpO2 93%   BMI 37.97 kg/m  Physical Exam Vitals and nursing note reviewed.  Constitutional:      General: He is not in acute distress.    Appearance: He is not ill-appearing.  HENT:     Head: Normocephalic and atraumatic.     Nose: No congestion.  Eyes:     Conjunctiva/sclera: Conjunctivae normal.  Cardiovascular:     Rate and Rhythm: Normal rate and regular rhythm.     Pulses: Normal pulses.     Heart sounds: No murmur heard.    No friction rub. No gallop.  Pulmonary:     Effort: No respiratory distress.     Breath sounds: No wheezing, rhonchi or rales.     Comments: No evidence of respiratory distress nontachypneic nonhypoxic speaking full sentences, lung sounds  are clear bilaterally. Abdominal:     Palpations: Abdomen is soft.     Tenderness: There is no abdominal tenderness. There is no right CVA tenderness or left CVA tenderness.  Musculoskeletal:     Comments: Fistula noted in the left AC, good palpable thrill no evidence of infection noted.  Spine was palpated was nontender to palpation no step-off deformities noted no overlying skin changes.  Patient has point tenderness within the musculature on his left scapula.  He is moving his left upper arm without difficulty all compartments soft sensation intact to light touch  second cap refill 2+ radial pulses.  Skin:    General: Skin is warm and dry.  Neurological:     Mental Status: He is alert.  Psychiatric:        Mood and Affect: Mood normal.     ED Results / Procedures / Treatments   Labs (all labs ordered are listed, but only abnormal results are displayed) Labs Reviewed  BASIC METABOLIC PANEL - Abnormal; Notable for the following components:      Result Value   Chloride 96 (*)    Glucose, Bld 102 (*)    BUN 51 (*)    Creatinine, Ser 14.76 (*)    GFR, Estimated 4 (*)    Anion gap 16 (*)    All other components within normal limits  CBC WITH DIFFERENTIAL/PLATELET - Abnormal; Notable for the following components:   RBC 3.59 (*)    Hemoglobin 11.9 (*)    HCT 36.7 (*)    MCV 102.2 (*)    Platelets 80 (*)    Neutro Abs 7.9 (*)    All other components within normal limits  RESP PANEL BY RT-PCR (RSV, FLU A&B, COVID)  [id number]    EKG EKG Interpretation  Date/Time:  Tuesday June 26 2022 04:16:09 EST Ventricular Rate:  84 PR Interval:  171 QRS Duration: 107 QT Interval:  407 QTC Calculation: 482 R Axis:   -40 Text Interpretation: Sinus rhythm Left atrial enlargement Left axis deviation RSR' in V1 or V2, probably normal variant Borderline prolonged QT interval QT has lengthened Confirmed by Georgina Snell [phone]) on 06/26/2022 4:18:37 AM  Radiology DG Chest 2 View  Result  Date: 06/25/2022 CLINICAL DATA:  Cough EXAM: CHEST - 2 VIEW COMPARISON:  Chest x-ray 05/06/2021 FINDINGS: The heart size and mediastinal contours are within normal limits. Both lungs are clear. The visualized skeletal structures are unremarkable. Vascular stent is noted in the left axillary region. IMPRESSION: No active cardiopulmonary disease. Electronically Signed   By: Ronney Asters M.D.   On: 06/25/2022 23:45    Procedures Procedures    Medications Ordered in ED Medications  lidocaine (LIDODERM) 5 % 1 patch (1 patch Transdermal Patch Applied 06/26/22 0302)  ED Course/ Medical Decision Making/ A&P                           Medical Decision Making Risk Prescription drug management.   This patient presents to the ED for concern of back pain, this involves an extensive number of treatment options, and is a complaint that carries with it a high risk of complications and morbidity.  The differential diagnosis includes muscular strain, shingles, PE, ACS    Additional history obtained:  Additional history obtained from friend at bedside External records from outside source obtained and reviewed including urgent care notes   Co morbidities that complicate the patient evaluation  End-stage renal disease, on anticoag's  Social Determinants of Health:  Noncompliance    Lab Tests:  I Ordered, and personally interpreted labs.  The pertinent results include: CBC shows macrocytic anemia hemoglobin 11.9, BMP shows chloride of 96 glucose 102 BUN 51 creatinine 1.4 GFR 4 anion gap 16   Imaging Studies ordered:  I ordered imaging studies including chest x-ray I independently visualized and interpreted imaging which showed negative acute findings I agree with the radiologist interpretation   Cardiac Monitoring:  The patient was maintained on a cardiac monitor.  I personally viewed and interpreted the cardiac monitored which showed an underlying rhythm of: Without signs of  ischemia   Medicines ordered and prescription drug management:  I ordered medication including lidocaine patch I have reviewed the patients home medicines and have made adjustments as needed  Critical Interventions:  N/A   Reevaluation:  Presents with left-sided shoulder pain, triage obtain basic lab work which I personally reviewed, as well as imaging which was negative, will provide a lidocaine patch and reassess  Reassessed resting comfortably he has no complaints agreement discharge at this time  Consultations Obtained:  N/A    Test Considered:  N/A    Rule out Suspicion for shingles is low as there is no notable rash during my skin exam.  I doubt PE as he denies any pleuritic chest pain shortness of breath presentation is atypical etiology is also been compliant with his Eliquis making this unlikely.  My suspicion for ACS is also low denies any chest pain or shortness of breath, EKG is without signs of ischemia. suspicion for emergent hemodialysis is also low as he is not volume overloaded my exam no new oxygen requirements there is no evidence of a electrolyte derailment.  Suspicion for pneumonia is also low as lung sounds are clear only, chest x-ray unremarkable, he is currently on Augmentin we will have him continue with this.    Dispostion and problem list  After consideration of the diagnostic results and the patients response to treatment, I feel that the patent would benefit from discharge.  1.  Left shoulder pain-likely muscular in nature will discharge home with some pain medications, follow-up with her primary care provider for further evaluation and strict return precautions.  2.  Cough-likely viral, patient is currently on antibiotics, as well as steroids above, this follow-up with his primary care provider            Final Clinical Impression(s) / ED Diagnoses Final diagnoses:  Acute pain of left shoulder    Rx / DC Orders ED Discharge  Orders          Ordered    cyclobenzaprine (FLEXERIL) 10 MG tablet  2 times daily PRN        06/26/22 0412  Marcello Fennel, PA-C 06/26/22 8628    Elgie Congo, MD 06/26/22 1600

## 2022-06-26 NOTE — Discharge Instructions (Addendum)
Left shoulder pain-likely this is muscular, given a muscle relaxer take as prescribed, this medication make you drowsy do not consume alcohol or operate heavy machinery will take this medication.  Please follow with your primary doctor symptoms do not prove after a week's time. Cough-suspect this is a viral infection, please continue with the antibiotics as well as steroids are prescribed if symptoms do not prove after weeks time please follow your primary care provider  Come back to the emergency department if you develop chest pain, shortness of breath, severe abdominal pain, uncontrolled nausea, vomiting, diarrhea.

## 2022-06-26 NOTE — ED Notes (Signed)
I provided reinforced discharge education based off of after visit summary/care provided. Pt acknowledged and understood my education. Pt had no further questions/concerns for provider/myself. After visit summary provided to pt. 

## 2022-06-29 ENCOUNTER — Other Ambulatory Visit: Payer: Self-pay

## 2022-06-29 ENCOUNTER — Other Ambulatory Visit: Payer: Self-pay | Admitting: Nurse Practitioner

## 2022-06-29 DIAGNOSIS — I69398 Other sequelae of cerebral infarction: Secondary | ICD-10-CM

## 2022-06-29 NOTE — Telephone Encounter (Signed)
Requested medication (s) are due for refill today: yes  Requested medication (s) are on the active medication list: yes  Last refill:  03/07/22 #180  Future visit scheduled: no  Notes to clinic:  overdue ALT, AST and Cr outside normal range   Requested Prescriptions  Pending Prescriptions Disp Refills   lamoTRIgine (LAMICTAL) 200 MG tablet 180 tablet 0    Sig: TAKE 1 TABLET (200 MG TOTAL) BY MOUTH 2 (TWO) TIMES DAILY.     Neurology:  Anticonvulsants - lamotrigine Failed - 06/29/2022  2:29 PM      Failed - Cr in normal range and within 360 days    Creat  Date Value Ref Range Status  01/24/2021 15.72 (H) 0.60 - 1.29 mg/dL Final    Comment:    Verified by repeat analysis. .    Creatinine, Ser  Date Value Ref Range Status  06/25/2022 14.76 (H) 0.61 - 1.24 mg/dL Final   Creatinine, POC  Date Value Ref Range Status  12/10/2016 200 mg/dL Final   Creatinine, Urine  Date Value Ref Range Status  12/21/2017 86.75 mg/dL Final    Comment:    Performed at [facility], [place] [address], [place], [place] [address]  12/21/2017 87.57 mg/dL Final         Failed - ALT in normal range and within 360 days    ALT  Date Value Ref Range Status  05/04/2021 15 0 - 44 U/L Final         Failed - AST in normal range and within 360 days    AST  Date Value Ref Range Status  05/04/2021 14 (L) 15 - 41 U/L Final         Failed - Completed PHQ-2 or PHQ-9 in the last 360 days      Passed - Valid encounter within last 12 months    Recent Outpatient Visits           3 months ago Chronic anticoagulation   [facility], Dionne Bucy, PA-C   1 year ago Primary hypertension   [place], Vernia Buff, NP   2 years ago Essential hypertension   [place], Vernia Buff, NP   3 years ago Seizure disorder [facility])   [place] Fulp, [place], MD   4 years ago Essential  hypertension   [place], Vernia Buff, NP

## 2022-06-29 NOTE — Telephone Encounter (Signed)
Copied from [place]. Topic: General - Other >> Jun 29, 2022 12:32 PM Everette C wrote: Reason for CRM: Medication Refill - Medication: lamoTRIgine (LAMICTAL) 200 MG tablet [[id number]]  Has the patient contacted their pharmacy? Yes.   (Agent: If no, request that the patient contact the pharmacy for the refill. If patient does not wish to contact the pharmacy document the reason why and proceed with request.) (Agent: If yes, when and what did the pharmacy advise?)  Preferred Pharmacy (with phone number or street name): [facility] [place], [address] [place] [address] Phone: [phone] Fax: [phone] Hours: Open 24 hours   Has the patient been seen for an appointment in the last year OR does the patient have an upcoming appointment? Yes.    Agent: Please be advised that RX refills may take up to 3 business days. We ask that you follow-up with your pharmacy.

## 2022-07-03 ENCOUNTER — Encounter: Payer: Self-pay | Admitting: Nurse Practitioner

## 2022-07-03 ENCOUNTER — Telehealth (HOSPITAL_BASED_OUTPATIENT_CLINIC_OR_DEPARTMENT_OTHER): Payer: 59 | Admitting: Nurse Practitioner

## 2022-07-03 DIAGNOSIS — R569 Unspecified convulsions: Secondary | ICD-10-CM

## 2022-07-03 DIAGNOSIS — I69398 Other sequelae of cerebral infarction: Secondary | ICD-10-CM | POA: Diagnosis not present

## 2022-07-03 MED ORDER — LAMOTRIGINE 200 MG PO TABS: 200.0000 mg | ORAL_TABLET | Freq: Two times a day (BID) | ORAL | 0 refills | Status: DC

## 2022-07-03 NOTE — Progress Notes (Signed)
Virtual Visit Note  I discussed the limitations, risks, security and privacy concerns of performing an evaluation and management service by video and the availability of in person appointments. I also discussed with the patient that there may be a patient responsible charge related to this service. The patient expressed understanding and agreed to proceed.    I connected with [name] [name] on 07/03/22  at   1:30 PM EST  EDT by VIDEO and verified that I am speaking with the correct person using two identifiers.   Location of Patient: Private Residence   Location of Provider: [place] participating in VIRTUAL visit: Geryl Rankins FNP-BC [employer]    History of Present Illness: VIRTUAL visit for: Medication refill  He has a past medical history of ESRD on HD, Hypertension, Lupus, Seizures, Stroke   I have not seen [name] [name] in over one year. He will need to come into the office for his next appt for a face to face visit. Will give temporary fill of lamotrigine at this time.  He is not following up with Neurology for his history of seizures or oncology for his thrombocytopenia or rheumatology for his positive lupus anticoagulant panel.  M-W-F HD days for ESRD Dark stools: taking eliquis and ferric citrate. Not taking eliquis as prescribed. Does not endorse any recent seizures over the past 6 months.   Past Medical History:  Diagnosis Date   Chronic kidney disease    ESRD (end stage renal disease) ([place])    Hypertension    Lupus ([place])    Seizures ([place])    Seizures ([place])    Stroke ([place])    64 or 42 years of age.     Wears glasses     Past Surgical History:  Procedure Laterality Date   AV FISTULA PLACEMENT Left 12/30/2017   Procedure: ARTERIOVENOUS (AV) FISTULA CREATION VERSUS INSERTION OF ARTERIOVENOUS GRAFT LEFT ARM;  Surgeon: Rosetta Posner, MD;  Location: [place];  Service: Vascular;  Laterality: Left;   [place] Left 08/15/2018   Procedure: LEFT FIRST STAGE [place];  Surgeon: Rosetta Posner, MD;  Location: [place];  Service: Vascular;  Laterality: Left;   [place] Left 10/30/2018   Procedure: INSERTION OF GORE-TEX GRAFT LEFT UPPER ARM;  Surgeon: Waynetta Sandy, MD;  Location: [place];  Service: Vascular;  Laterality: Left;   INSERTION OF DIALYSIS CATHETER N/A 09/20/2018   Procedure: INSERTION OF TUNNELED DIALYSIS CATHETER RIGHT INTERNAL JUGULAR;  Surgeon: Marty Heck, MD;  [facility];  Service: Vascular;  Laterality: N/A;   RENAL BIOPSY      Family History  Problem Relation Age of Onset   Diabetes Mother    Diabetes Maternal Grandmother    Seizures Neg Hx        not on mother's side. he doesn't know about his father's side    Social History   Socioeconomic History   Marital status: Single    Spouse name: Not on file   Number of children: 0   Years of education: Not on file   Highest education level: High school graduate  Occupational History   Not on file  Tobacco Use   Smoking status: Former    Packs/day: 0.25    Types: Cigarettes    Quit date: 12/21/2017    Years since quitting: 4.5   Smokeless tobacco: Never  Vaping Use   Vaping Use: Never used  Substance and Sexual Activity   Alcohol use: Not Currently   Drug use: No   Sexual activity: Not Currently  Other Topics Concern   Not on file  Social History Narrative   Lives with a friend   Right handed   Caffeine: rare   Social Determinants of Health   Financial Resource Strain: Not on file  Food Insecurity: Not on file  Transportation Needs: Not on file  Physical Activity: Not on file  Stress: Not on file  Social Connections: Not on file     Observations/Objective: Awake, alert and oriented x 3   Review of Systems  Constitutional:  Negative for fever, malaise/fatigue and weight loss.  HENT: Negative.  Negative for nosebleeds.   Eyes: Negative.   Negative for blurred vision, double vision and photophobia.  Respiratory: Negative.  Negative for cough and shortness of breath.   Cardiovascular: Negative.  Negative for chest pain, palpitations and leg swelling.  Gastrointestinal: Negative.  Negative for heartburn, nausea and vomiting.  Musculoskeletal: Negative.  Negative for myalgias.  Neurological: Negative.  Negative for dizziness, focal weakness, seizures and headaches.  Psychiatric/Behavioral: Negative.  Negative for suicidal ideas.     Assessment and Plan: Diagnoses and all orders for this visit:  Seizure, late effect of stroke ([place]) -     lamoTRIgine (LAMICTAL) 200 MG tablet; Take 1 tablet (200 mg total) by mouth 2 (two) times daily. NO REFILLS> NO EXCEPTIONS     Follow Up Instructions No follow-ups on file.     I discussed the assessment and treatment plan with the patient. The patient was provided an opportunity to ask questions and all were answered. The patient agreed with the plan and demonstrated an understanding of the instructions.   The patient was advised to call back or seek an in-person evaluation if the symptoms worsen or if the condition fails to improve as anticipated.  I provided 11 minutes of face-to-face time during this encounter including median intraservice time, reviewing previous notes, labs, imaging, medications and explaining diagnosis and management.  Gildardo Pounds, FNP-BC

## 2022-07-16 ENCOUNTER — Emergency Department (HOSPITAL_COMMUNITY)
Admission: EM | Admit: 2022-07-16 | Discharge: 2022-07-16 | Disposition: A | Discharge status: Home / Self Care | Payer: No Typology Code available for payment source | Attending: Emergency Medicine | Admitting: Emergency Medicine

## 2022-07-16 ENCOUNTER — Encounter (HOSPITAL_COMMUNITY): Payer: Self-pay | Admitting: Emergency Medicine

## 2022-07-16 ENCOUNTER — Other Ambulatory Visit: Payer: Self-pay

## 2022-07-16 ENCOUNTER — Emergency Department (HOSPITAL_COMMUNITY): Payer: No Typology Code available for payment source

## 2022-07-16 DIAGNOSIS — Z87891 Personal history of nicotine dependence: Secondary | ICD-10-CM | POA: Insufficient documentation

## 2022-07-16 DIAGNOSIS — Y841 Kidney dialysis as the cause of abnormal reaction of the patient, or of later complication, without mention of misadventure at the time of the procedure: Secondary | ICD-10-CM | POA: Insufficient documentation

## 2022-07-16 DIAGNOSIS — T82868A Thrombosis of vascular prosthetic devices, implants and grafts, initial encounter: Secondary | ICD-10-CM | POA: Diagnosis not present

## 2022-07-16 DIAGNOSIS — N186 End stage renal disease: Secondary | ICD-10-CM | POA: Diagnosis not present

## 2022-07-16 DIAGNOSIS — D6862 Lupus anticoagulant syndrome: Secondary | ICD-10-CM | POA: Insufficient documentation

## 2022-07-16 DIAGNOSIS — Z992 Dependence on renal dialysis: Secondary | ICD-10-CM | POA: Insufficient documentation

## 2022-07-16 DIAGNOSIS — I120 Hypertensive chronic kidney disease with stage 5 chronic kidney disease or end stage renal disease: Secondary | ICD-10-CM | POA: Diagnosis not present

## 2022-07-16 HISTORY — PX: IR US GUIDE VASC ACCESS RIGHT: IMG2390

## 2022-07-16 HISTORY — PX: IR FLUORO GUIDE CV LINE RIGHT: IMG2283

## 2022-07-16 LAB — CBC
HCT: 31.3 % — ABNORMAL LOW (ref 39.0–52.0)
Hemoglobin: 10.6 g/dL — ABNORMAL LOW (ref 13.0–17.0)
MCH: 33.8 pg (ref 26.0–34.0)
MCHC: 33.9 g/dL (ref 30.0–36.0)
MCV: 99.7 fL (ref 80.0–100.0)
Platelets: 139 10*3/uL — ABNORMAL LOW (ref 150–400)
RBC: 3.14 MIL/uL — ABNORMAL LOW (ref 4.22–5.81)
RDW: 13.5 % (ref 11.5–15.5)
WBC: 7.7 10*3/uL (ref 4.0–10.5)
nRBC: 0 % (ref 0.0–0.2)

## 2022-07-16 LAB — COMPREHENSIVE METABOLIC PANEL
ALT: 18 U/L (ref 0–44)
AST: 18 U/L (ref 15–41)
Albumin: 3.4 g/dL — ABNORMAL LOW (ref 3.5–5.0)
Alkaline Phosphatase: 79 U/L (ref 38–126)
Anion gap: 17 — ABNORMAL HIGH (ref 5–15)
BUN: 117 mg/dL — ABNORMAL HIGH (ref 6–20)
CO2: 18 mmol/L — ABNORMAL LOW (ref 22–32)
Calcium: 9.3 mg/dL (ref 8.9–10.3)
Chloride: 101 mmol/L (ref 98–111)
Creatinine, Ser: 26.45 mg/dL — ABNORMAL HIGH (ref 0.61–1.24)
GFR, Estimated: 2 mL/min — ABNORMAL LOW (ref >60)
Glucose, Bld: 101 mg/dL — ABNORMAL HIGH (ref 70–99)
Potassium: 4.6 mmol/L (ref 3.5–5.1)
Sodium: 136 mmol/L (ref 135–145)
Total Bilirubin: 0.6 mg/dL (ref 0.3–1.2)
Total Protein: 7.4 g/dL (ref 6.5–8.1)

## 2022-07-16 LAB — MAGNESIUM: Magnesium: 2.9 mg/dL — ABNORMAL HIGH (ref 1.7–2.4)

## 2022-07-16 LAB — PHOSPHORUS: Phosphorus: 9 mg/dL — ABNORMAL HIGH (ref 2.5–4.6)

## 2022-07-16 MED ORDER — GELATIN ABSORBABLE 12-7 MM EX MISC
CUTANEOUS | Status: AC
  Filled 2022-07-16: qty 1

## 2022-07-16 MED ORDER — LIDOCAINE HCL 1 % IJ SOLN
INTRAMUSCULAR | Status: AC
  Filled 2022-07-16: qty 20

## 2022-07-16 MED ORDER — HEPARIN SODIUM (PORCINE) 1000 UNIT/ML IJ SOLN
INTRAMUSCULAR | Status: AC
  Administered 2022-07-16: 3.2 mL
  Filled 2022-07-16: qty 10

## 2022-07-16 MED ORDER — CEFAZOLIN SODIUM-DEXTROSE 2-4 GM/100ML-% IV SOLN: 2.0000 g | INTRAVENOUS | Status: DC

## 2022-07-16 MED ORDER — CEFAZOLIN SODIUM-DEXTROSE 2-4 GM/100ML-% IV SOLN
INTRAVENOUS | Status: AC
  Filled 2022-07-16: qty 100

## 2022-07-16 MED ORDER — CEFAZOLIN SODIUM-DEXTROSE 2-4 GM/100ML-% IV SOLN
INTRAVENOUS | Status: DC | PRN
  Administered 2022-07-16: 2 g via INTRAVENOUS

## 2022-07-16 MED ORDER — LIDOCAINE-EPINEPHRINE 1 %-1:100000 IJ SOLN
INTRAMUSCULAR | Status: AC
  Administered 2022-07-16: 15 mL
  Filled 2022-07-16: qty 1

## 2022-07-16 NOTE — Progress Notes (Signed)
Appreciate IR assistance in getting him set up for tunneled HD line today. I have arranged him to have dialysis tomorrow morning at his clinic - be there at 6:40am for 7am on-time. I will have his clinic set up declot later this week.  Veneta Penton, PA-C [employer] Pager [phone]

## 2022-07-16 NOTE — Discharge Instructions (Addendum)
Seen today for catheter placement.  Postprocedural, you feel back to baseline.  You have requested discharge which I agree is reasonable given your dialysis appointment tomorrow morning.  Please follow-up with your dialysis clinic or return to the emergency department with any new symptoms.

## 2022-07-16 NOTE — ED Provider Notes (Signed)
[facility] Provider Note  CSN: [id number] Arrival date & time: 07/16/22 0840  Chief Complaint(s) Vascular Access Problem  HPI [name] [name] is a 42 y.o. male with history of lupus, end-stage renal disease, presenting with fistula problem.  Reports that he has not had dialysis since last Wednesday.  Typically has Monday, Wednesday, Friday dialysis.  He reports minimal shortness of breath with lying flat, feels his legs are slightly more swollen, otherwise denies any nausea or vomiting, difficulty breathing at rest, chest pain, abdominal pain, fevers or chills.  He was supposed to get a declot procedure for his left fistula but apparently was given the wrong appointment time.  He presented to the emergency department.   Past Medical History Past Medical History:  Diagnosis Date   Chronic kidney disease    ESRD (end stage renal disease) ([place])    Hypertension    Lupus ([place])    Seizures ([place])    Seizures ([place])    Stroke ([place])    89 or 42 years of age.     Wears glasses    Patient Active Problem List   Diagnosis Date Noted   ESRD on hemodialysis ([place]) 02/15/2022   Other disorders of phosphorus metabolism 08/18/2021   Fluid overload, unspecified 05/10/2021   Allergy, unspecified, initial encounter 04/19/2021   Anaphylactic shock, unspecified, sequela 04/19/2021   Pruritus, unspecified 04/19/2021   Hypocalcemia 04/01/2021   Long-term current use of anticonvulsant 08/04/2020   COVID-19 07/26/2020   Breakthrough seizure ([place]) 07/20/2020   Acute respiratory failure with hypoxia (HCC)    Lupus ([place])    Seizure, late effect of stroke ([place]) 08/26/2019   Secondary hyperparathyroidism of renal origin ([place]) 11/05/2018   Anemia in chronic kidney disease 09/23/2018   ESRD (end stage renal disease) ([place]) 03/18/2018   Prediabetes 01/10/2018   Chest pain in adult    CKD (chronic kidney disease) stage 3, GFR 30-59 ml/min ([place]) 10/24/2015    Seizures ([place]) 10/24/2015   HTN (hypertension) 01/12/2015   Chronic anticoagulation 04/24/2011   Lupus anticoagulant positive 04/24/2011   OBESITY, NOS 08/15/2006   THROMBOCYTOPENIA 08/15/2006   TOBACCO DEPENDENCE 08/15/2006   CVA 08/15/2006   Home Medication(s) Prior to Admission medications   Medication Sig Start Date End Date Taking? Authorizing Provider  amLODipine (NORVASC) 10 MG tablet TAKE 1 TABLET BY MOUTH AT BEDTIME 03/07/22 03/07/23  Argentina Donovan, PA-C  amoxicillin (AMOXIL) 500 MG tablet Take 1 tablet (500 mg total) by mouth 2 (two) times daily. 06/15/22   Enrique Sack, FNP  apixaban (ELIQUIS) 2.5 MG TABS tablet Take 1 tablet (2.5 mg total) by mouth 2 (two) times daily. 03/07/22 05/06/22  Argentina Donovan, PA-C  atorvastatin (LIPITOR) 40 MG tablet Take 1 tablet (40 mg total) by mouth daily. 03/07/22   Argentina Donovan, PA-C  benzonatate (TESSALON) 200 MG capsule Take 1 capsule (200 mg total) by mouth 3 (three) times daily as needed for cough. 06/15/22   Enrique Sack, FNP  carvedilol (COREG) 6.25 MG tablet Take 1 tablet (6.25 mg total) by mouth 2 (two) times daily with a meal. 03/07/22 04/06/22  Argentina Donovan, PA-C  cinacalcet (SENSIPAR) 60 MG tablet Take 60 mg by mouth daily. 02/02/21   [provider]  cyclobenzaprine (FLEXERIL) 10 MG tablet Take 1 tablet (10 mg total) by mouth 2 (two) times daily as needed for muscle spasms. 06/26/22   Marcello Fennel, PA-C  Dextromethorphan-guaiFENesin (MUCINEX DM MAXIMUM STRENGTH) 60-1200 MG  TB12 Take 1 tablet by mouth 2 (two) times daily. 06/15/22   Enrique Sack, FNP  lamoTRIgine (LAMICTAL) 200 MG tablet Take 1 tablet (200 mg total) by mouth 2 (two) times daily. NO REFILLS> NO EXCEPTIONS 07/03/22 09/01/22  Gildardo Pounds, NP  predniSONE (STERAPRED UNI-PAK 21 TAB) 10 MG (21) TBPK tablet Take as directed 06/15/22   Enrique Sack, FNP  silver sulfADIAZINE (SILVADENE) 1 % cream Apply to affected skin areas once per  day for 10 days or until area is healed 11/24/18   Antony Blackbird, MD                                                                                                                                    Past Surgical History Past Surgical History:  Procedure Laterality Date   AV FISTULA PLACEMENT Left 12/30/2017   Procedure: ARTERIOVENOUS (AV) FISTULA CREATION VERSUS INSERTION OF ARTERIOVENOUS GRAFT LEFT ARM;  Surgeon: Rosetta Posner, MD;  Location: [facility];  Service: Vascular;  Laterality: Left;   [place] Left 08/15/2018   Procedure: LEFT FIRST STAGE [place];  Surgeon: Rosetta Posner, MD;  Location: [place];  Service: Vascular;  Laterality: Left;   [place] Left 10/30/2018   Procedure: INSERTION OF GORE-TEX GRAFT LEFT UPPER ARM;  Surgeon: Waynetta Sandy, MD;  Location: [place];  Service: Vascular;  Laterality: Left;   INSERTION OF DIALYSIS CATHETER N/A 09/20/2018   Procedure: INSERTION OF TUNNELED DIALYSIS CATHETER RIGHT INTERNAL JUGULAR;  Surgeon: Marty Heck, MD;  [facility];  Service: Vascular;  Laterality: N/A;   RENAL BIOPSY     Family History Family History  Problem Relation Age of Onset   Diabetes Mother    Diabetes Maternal Grandmother    Seizures Neg Hx        not on mother's side. he doesn't know about his father's side    Social History Social History   Tobacco Use   Smoking status: Former    Packs/day: 0.25    Types: Cigarettes    Quit date: 12/21/2017    Years since quitting: 4.5   Smokeless tobacco: Never  Vaping Use   Vaping Use: Never used  Substance Use Topics   Alcohol use: Not Currently   Drug use: No   Allergies Zyrtec [cetirizine]  Review of Systems Review of Systems  All other systems reviewed and are negative.   Physical Exam Vital Signs  I have reviewed the triage vital signs BP (!) 171/113   Pulse 81   Temp 97.9 F (36.6 C)   Resp (!) 22   SpO2 97%  Physical Exam Vitals  and nursing note reviewed.  Constitutional:      General: He is not in acute distress.    Appearance: Normal appearance.  HENT:     Mouth/Throat:     Mouth: Mucous membranes are moist.  Eyes:  Conjunctiva/sclera: Conjunctivae normal.  Cardiovascular:     Rate and Rhythm: Normal rate and regular rhythm.  Pulmonary:     Effort: Pulmonary effort is normal. No respiratory distress.     Breath sounds: Normal breath sounds.  Abdominal:     General: Abdomen is flat.     Palpations: Abdomen is soft.     Tenderness: There is no abdominal tenderness.  Musculoskeletal:     Right lower leg: No edema.     Left lower leg: No edema.     Comments: Left upper extremity fistula without palpable thrill  Skin:    General: Skin is warm and dry.     Capillary Refill: Capillary refill takes less than 2 seconds.  Neurological:     Mental Status: He is alert and oriented to person, place, and time. Mental status is at baseline.  Psychiatric:        Mood and Affect: Mood normal.        Behavior: Behavior normal.     ED Results and Treatments Labs (all labs ordered are listed, but only abnormal results are displayed) Labs Reviewed  CBC - Abnormal; Notable for the following components:      Result Value   RBC 3.14 (*)    Hemoglobin 10.6 (*)    HCT 31.3 (*)    Platelets 139 (*)    All other components within normal limits  COMPREHENSIVE METABOLIC PANEL - Abnormal; Notable for the following components:   CO2 18 (*)    Glucose, Bld 101 (*)    BUN 117 (*)    Creatinine, Ser 26.45 (*)    Albumin 3.4 (*)    GFR, Estimated 2 (*)    Anion gap 17 (*)    All other components within normal limits  PHOSPHORUS - Abnormal; Notable for the following components:   Phosphorus 9.0 (*)    All other components within normal limits  MAGNESIUM - Abnormal; Notable for the following components:   Magnesium 2.9 (*)    All other components within normal limits                                                                                                                           Radiology DG Chest 1 View  Result Date: 07/16/2022 CLINICAL DATA:  Shortness of breath. Patient is in need of dialysis. EXAM: CHEST  1 VIEW COMPARISON:  Chest radiograph 06/25/2022 FINDINGS: Increased lung markings in the lower lungs and evidence for some Kerley B lines. Heart size is upper limits of normal but stable. The trachea is midline. Negative for a pneumothorax. Trachea is midline. No acute bone abnormality. IMPRESSION: 1. Increased lung markings in the lower lungs with Kerley B lines. Findings are suggestive for mild interstitial pulmonary edema. Electronically Signed   By: Markus Daft M.D.   On: 07/16/2022 09:53    Pertinent labs & imaging results that were available during my care of the patient were reviewed by me  and considered in my medical decision making (see MDM for details).  Medications Ordered in ED Medications  ceFAZolin (ANCEF) IVPB 2g/100 mL premix (has no administration in time range)  ceFAZolin (ANCEF) 2-4 GM/100ML-% IVPB (has no administration in time range)                                                                                                                                     Procedures Procedures  (including critical care time)  Medical Decision Making / ED Course   MDM:  42 year old male presenting with fistula problem.  Patient overall well-appearing, exam unremarkable including clear lungs with the exception of fistula with no thrill.  Given need for hemodialysis access, nephrology and interventional radiology were consulted.  Plan is to go to IR today to have likely tunneled catheter placed versus declot.  If tunneled catheter is placed patient will have declotting procedure arranged as an outpatient by his nephrology team.  He has dialysis scheduled for tomorrow morning.  He does not appear volume overloaded currently, no pitting edema, lungs clear, he has no severe  metabolic abnormality such as hyperkalemia to suggest need for urgent dialysis at this time.  He is slightly hypertensive but asymptomatic.  He does have a high BUN but has normal mental status and is oriented, not confused.  He does not have severe acidosis.  Will be discharged following his procedure.      Additional history obtained: -External records from outside source obtained and reviewed including: Chart review including previous notes, labs, imaging, consultation notes including ED visit 06/26/22   Lab Tests: -I ordered, reviewed, and interpreted labs.   The pertinent results include:   Labs Reviewed  CBC - Abnormal; Notable for the following components:      Result Value   RBC 3.14 (*)    Hemoglobin 10.6 (*)    HCT 31.3 (*)    Platelets 139 (*)    All other components within normal limits  COMPREHENSIVE METABOLIC PANEL - Abnormal; Notable for the following components:   CO2 18 (*)    Glucose, Bld 101 (*)    BUN 117 (*)    Creatinine, Ser 26.45 (*)    Albumin 3.4 (*)    GFR, Estimated 2 (*)    Anion gap 17 (*)    All other components within normal limits  PHOSPHORUS - Abnormal; Notable for the following components:   Phosphorus 9.0 (*)    All other components within normal limits  MAGNESIUM - Abnormal; Notable for the following components:   Magnesium 2.9 (*)    All other components within normal limits    Notable for mild acidosis, uremia, mild anemia   Medicines ordered and prescription drug management: Meds ordered this encounter  Medications   ceFAZolin (ANCEF) IVPB 2g/100 mL premix    Order Specific Question:   Antibiotic Indication:    Answer:   Surgical Prophylaxis  ceFAZolin (ANCEF) 2-4 GM/100ML-% IVPB    Dhers, Mardene Celeste: cabinet override    -I have reviewed the patients home medicines and have made adjustments as needed   Consultations Obtained: I requested consultation with the nephrology and IR,  and discussed lab and imaging findings as well as  pertinent plan - they recommend: tunneled cath placement, declot as outpatient  Social Determinants of Health:  Diagnosis or treatment significantly limited by social determinants of health: former smoker  Co morbidities that complicate the patient evaluation  Past Medical History:  Diagnosis Date   Chronic kidney disease    ESRD (end stage renal disease) ([place])    Hypertension    Lupus ([place])    Seizures ([place])    Seizures ([place])    Stroke ([place])    56 or 42 years of age.     Wears glasses       Dispostion: Disposition decision including need for hospitalization was considered, and patient taken to interventional radiology for outpatient procedure    Final Clinical Impression(s) / ED Diagnoses Final diagnoses:  Thrombosis of arteriovenous dialysis fistula, initial encounter [facility])     This chart was dictated using voice recognition software.  Despite best efforts to proofread,  errors can occur which can change the documentation meaning.    Cristie Hem, MD 07/16/22 [phone]

## 2022-07-16 NOTE — H&P (Signed)
   Patient Status: [facility] - ED  Assessment and Plan: Patient in need of venous access.   [name] L. 22, 42 year old male, is scheduled today for an image-guided tunneled dialysis catheter placement with local anesthesia only.  Risks and benefits discussed with the patient including, but not limited to bleeding, infection, vascular injury, pneumothorax which may require chest tube placement, air embolism or even death  All of the patient's questions were answered, patient is agreeable to proceed.  Consent signed and in IR  ______________________________________________________________________   History of Present Illness: [name] [name] is a 42 y.o. male with a medical history significant for ESRD on hemodialysis via a LUE AVF. He missed a declot appointment this morning with [facility] and the patient presented to the ED. Patient offered the choice between a tunneled dialysis catheter today with local anesthesia only or a fistulogram with possible intervention, possible placement of a tunneled dialysis catheter tomorrow with sedation. Patient elected for tunneled line today.   Patient will be scheduled for outpatient dialysis tomorrow and a declot procedure with Vascular on Wednesday 1/31. Both of these appointments will be arranged by the Nephrology team.   Allergies and medications reviewed.   Review of Systems: A 12 point ROS discussed and pertinent positives are indicated in the HPI above.  All other systems are negative.  Review of Systems  Constitutional:  Negative for appetite change and fatigue.  Respiratory:  Positive for shortness of breath.   Gastrointestinal:  Negative for abdominal pain, diarrhea, nausea and vomiting.  Neurological:  Negative for headaches.    Vital Signs: BP (!) 171/113   Pulse 80   Temp 97.9 F (36.6 C)   Resp 20   SpO2 94%   Physical Exam Constitutional:      General: He is not in acute distress.    Appearance: He is not ill-appearing.   HENT:     Mouth/Throat:     Mouth: Mucous membranes are moist.     Pharynx: Oropharynx is clear.  Cardiovascular:     Rate and Rhythm: Normal rate and regular rhythm.  Pulmonary:     Effort: Pulmonary effort is normal.  Skin:    General: Skin is warm and dry.  Neurological:     Mental Status: He is alert and oriented to person, place, and time.      Imaging reviewed.   Labs:  COAGS: No results for input(s): "INR", "APTT" in the last 8760 hours.  BMP: Recent Labs    06/25/22 2332 07/16/22 0857  NA 140 136  K 4.1 4.6  CL 96* 101  CO2 28 18*  GLUCOSE 102* 101*  BUN 51* 117*  CALCIUM 9.9 9.3  CREATININE 14.76* 26.45*  GFRNONAA 4* 2*       Electronically Signed: Theresa Duty, NP 07/16/2022, 3:08 PM   I spent a total of 15 minutes in face to face in clinical consultation, greater than 50% of which was counseling/coordinating care for venous access.

## 2022-07-16 NOTE — ED Notes (Signed)
RN went to introduce self to pt, Pt transport arrived at same time to take pt to IR. Pt in NAD with regular respiratory pattern at this time and alert and oriented

## 2022-07-16 NOTE — ED Provider Notes (Signed)
Care patient received from prior provider at 1600, please see their note for HPI and physical exam. In short, patient with clotted dialysis catheter sent to emergency room for access. He was sent to IR for tunneled catheter placement. He returned from his procedure at 1730 and I reevaluated patient at bedside.  He is requesting discharge.  He is ambulatory tolerating p.o. intake in no acute distress.  Post procedurally, patient feels comfortable with outpatient care and management.  He will have dialysis in the morning or return to the emergency department should he have any further concerns.   Tretha Sciara, MD 07/16/22 1731

## 2022-07-16 NOTE — Progress Notes (Addendum)
RENAL QUICK NOTE:  S: Presented to ED after missing his declot appt at [facility] this AM, he reports that he was not given the correct time. The facility was otherwise with a full schedule. His last HD was Wed 07/11/22 and that was shortened. His is chronically up on fluids, but not dyspneic at the moment.  I tried to call [facility] - they are completely full for tomorrow - no openings. The first they can get to him is Wed morning.  O:  Vitals:   07/16/22 0849 07/16/22 1202  BP: (!) 175/116 (!) 165/120  Pulse: 88 80  Resp: 16 18  Temp: 97.9 F (36.6 C)   SpO2: 95% 94%   GEN: Well appearing, NAD. Room air. LUE AVF: Clotted, no bruit  LABS: K 4.6, BUN 117  A/P:  ESRD: Overdue for HD - usually MWF schedule. BP/volume: Chronically up on fluids - last HD 1/24. Clotted LUE AVF: Missed declot appt this AM and unable to reschedule as outpatient within reasonable amount of time. I spoke to IR and put order for declot v. [facility] placement for tomorrow AM. Discussed case with Dr. Carolin Sicks.  Veneta Penton, PA-C [employer] Pager [phone]

## 2022-07-16 NOTE — ED Provider Triage Note (Signed)
Emergency Medicine Provider Triage Evaluation Note  [name] [name] , a 42 y.o. male  was evaluated in triage.  Pt complains of problems with his fistula.  Patient has end-stage renal disease and last dialyzed this past Wednesday.  He is feeling moderately short of breath.  He was told that his fistula was clotted off on Friday.  He did not follow-up with vascular yet.  He came in for evaluation today.  He states "I do not want a catheter.".  Review of Systems  Positive: Shortness of breath Negative: Fever  Physical Exam  BP (!) 175/116   Pulse 88   Temp 97.9 F (36.6 C)   Resp 16   SpO2 95%  Gen:   Awake, no distress   Resp:  Normal effort  MSK:   Moves extremities without difficulty  Other:  Sleepy  Medical Decision Making  Medically screening exam initiated at 9:19 AM.  Appropriate orders placed.  Leanord Hawking was informed that the remainder of the evaluation will be completed by another provider, this initial triage assessment does not replace that evaluation, and the importance of remaining in the ED until their evaluation is complete.  Case discussed with Dr. Carlis Abbott who asked me to talk to IR.  Case discussed with Dr. Annamaria Boots who states that he is unable to help with this case today unless he needs emergency catheter placement.  I spoke with Dr.Bhandari who feels that the patient will need admission.  Labs are pending.   Margarita Mail, PA-C 07/16/22 [phone]

## 2022-07-16 NOTE — ED Triage Notes (Signed)
Pt reports his dialysis fistula is "clotted off." Pt reports he has not had dialysis since Wednesday of last week. Pt denies SHOB or pain.

## 2022-07-16 NOTE — ED Notes (Signed)
Pt brought back by IR pt resting NAD distress.

## 2022-07-16 NOTE — Procedures (Signed)
Interventional Radiology Procedure Note  Procedure: RT IJ HD CATH    Complications: None  Estimated Blood Loss:  MIN  Findings: SVCRA    Tamera Punt, MD

## 2022-08-08 ENCOUNTER — Other Ambulatory Visit: Payer: Self-pay

## 2022-08-08 ENCOUNTER — Emergency Department (HOSPITAL_COMMUNITY)
Admission: EM | Admit: 2022-08-08 | Discharge: 2022-08-08 | Disposition: A | Discharge status: Home / Self Care | Payer: 59 | Source: Home / Self Care | Attending: Emergency Medicine | Admitting: Emergency Medicine

## 2022-08-08 ENCOUNTER — Emergency Department (HOSPITAL_COMMUNITY): Payer: 59

## 2022-08-08 DIAGNOSIS — R000 Tachycardia, unspecified: Secondary | ICD-10-CM | POA: Insufficient documentation

## 2022-08-08 DIAGNOSIS — S01512A Laceration without foreign body of oral cavity, initial encounter: Secondary | ICD-10-CM | POA: Insufficient documentation

## 2022-08-08 DIAGNOSIS — X58XXXA Exposure to other specified factors, initial encounter: Secondary | ICD-10-CM | POA: Insufficient documentation

## 2022-08-08 DIAGNOSIS — R569 Unspecified convulsions: Secondary | ICD-10-CM | POA: Insufficient documentation

## 2022-08-08 DIAGNOSIS — Z7901 Long term (current) use of anticoagulants: Secondary | ICD-10-CM | POA: Insufficient documentation

## 2022-08-08 DIAGNOSIS — D696 Thrombocytopenia, unspecified: Secondary | ICD-10-CM | POA: Insufficient documentation

## 2022-08-08 DIAGNOSIS — G40901 Epilepsy, unspecified, not intractable, with status epilepticus: Secondary | ICD-10-CM | POA: Diagnosis not present

## 2022-08-08 DIAGNOSIS — D72829 Elevated white blood cell count, unspecified: Secondary | ICD-10-CM | POA: Insufficient documentation

## 2022-08-08 LAB — CBC WITH DIFFERENTIAL/PLATELET
Abs Immature Granulocytes: 0.06 10*3/uL (ref 0.00–0.07)
Basophils Absolute: 0.1 10*3/uL (ref 0.0–0.1)
Basophils Relative: 0 %
Eosinophils Absolute: 0.2 10*3/uL (ref 0.0–0.5)
Eosinophils Relative: 1 %
HCT: 37.9 % — ABNORMAL LOW (ref 39.0–52.0)
Hemoglobin: 12.1 g/dL — ABNORMAL LOW (ref 13.0–17.0)
Immature Granulocytes: 0 %
Lymphocytes Relative: 13 %
Lymphs Abs: 1.8 10*3/uL (ref 0.7–4.0)
MCH: 33 pg (ref 26.0–34.0)
MCHC: 31.9 g/dL (ref 30.0–36.0)
MCV: 103.3 fL — ABNORMAL HIGH (ref 80.0–100.0)
Monocytes Absolute: 1.2 10*3/uL — ABNORMAL HIGH (ref 0.1–1.0)
Monocytes Relative: 8 %
Neutro Abs: 10.7 10*3/uL — ABNORMAL HIGH (ref 1.7–7.7)
Neutrophils Relative %: 78 %
Platelets: 24 10*3/uL — CL (ref 150–400)
RBC: 3.67 MIL/uL — ABNORMAL LOW (ref 4.22–5.81)
RDW: 14.4 % (ref 11.5–15.5)
WBC: 14 10*3/uL — ABNORMAL HIGH (ref 4.0–10.5)
nRBC: 0 % (ref 0.0–0.2)

## 2022-08-08 LAB — BASIC METABOLIC PANEL
Anion gap: 21 — ABNORMAL HIGH (ref 5–15)
BUN: 35 mg/dL — ABNORMAL HIGH (ref 6–20)
CO2: 18 mmol/L — ABNORMAL LOW (ref 22–32)
Calcium: 9.2 mg/dL (ref 8.9–10.3)
Chloride: 95 mmol/L — ABNORMAL LOW (ref 98–111)
Creatinine, Ser: 12.38 mg/dL — ABNORMAL HIGH (ref 0.61–1.24)
GFR, Estimated: 5 mL/min — ABNORMAL LOW (ref >60)
Glucose, Bld: 150 mg/dL — ABNORMAL HIGH (ref 70–99)
Potassium: 3.5 mmol/L (ref 3.5–5.1)
Sodium: 134 mmol/L — ABNORMAL LOW (ref 135–145)

## 2022-08-08 NOTE — ED Notes (Signed)
Pt refused dc VS

## 2022-08-08 NOTE — Discharge Instructions (Signed)
You were seen in the emergency department for evaluation of seizure after dialysis.  You had a tongue laceration that was not sutured, this will usually heal on its own.  Warm salt water rinses.  Your lab work showed your platelets to be low.  This has happened before but lower than your usual levels.  This will need follow-up with your primary care doctor and oncology.  Continue your seizure medications.  Follow-up with your neurologist.  Return to the emergency department if any worsening or concerning symptoms.

## 2022-08-08 NOTE — ED Triage Notes (Signed)
Pt BIB EMS due to a seizure from dialysis, seizure was witnessed lasting 10-15 minutes per staff. Pt received 10 mg of versed due to pt being violent and agitated post seizure. Pt arrives in a NRB.

## 2022-08-08 NOTE — ED Provider Notes (Signed)
[facility] Provider Note   CSN: [id number] Arrival date & time: 08/08/22  1236     History  Chief Complaint  Patient presents with   Seizures    [name] [name] is a 42 y.o. male.  Patient was brought in by EMS after seizure.  He just received dialysis and was sitting when he began having a tonic-clonic seizure that may have lasted 5 or 10 minutes.  EMS found him extremely combative and required 10 of midazolam to be able to extricate him here.  He did bite his tongue.  There was no other reported head trauma.  He has not returned to normal mental status yet.  Reportedly has history of seizures and compliance of medication is not sure.  Level 5 caveat secondary to altered mental status  The history is provided by the EMS personnel.  Seizures Seizure activity on arrival: no   Seizure type:  Grand mal Postictal symptoms: confusion and somnolence   Return to baseline: no   PTA treatment:  Midazolam History of seizures: yes        Home Medications Prior to Admission medications   Medication Sig Start Date End Date Taking? Authorizing Provider  amLODipine (NORVASC) 10 MG tablet TAKE 1 TABLET BY MOUTH AT BEDTIME 03/07/22 03/07/23  Argentina Donovan, PA-C  amoxicillin (AMOXIL) 500 MG tablet Take 1 tablet (500 mg total) by mouth 2 (two) times daily. 06/15/22   Enrique Sack, FNP  apixaban (ELIQUIS) 2.5 MG TABS tablet Take 1 tablet (2.5 mg total) by mouth 2 (two) times daily. 03/07/22 05/06/22  Argentina Donovan, PA-C  atorvastatin (LIPITOR) 40 MG tablet Take 1 tablet (40 mg total) by mouth daily. 03/07/22   Argentina Donovan, PA-C  benzonatate (TESSALON) 200 MG capsule Take 1 capsule (200 mg total) by mouth 3 (three) times daily as needed for cough. 06/15/22   Enrique Sack, FNP  carvedilol (COREG) 6.25 MG tablet Take 1 tablet (6.25 mg total) by mouth 2 (two) times daily with a meal. 03/07/22 04/06/22  Argentina Donovan, PA-C  cinacalcet  (SENSIPAR) 60 MG tablet Take 60 mg by mouth daily. 02/02/21   [provider]  cyclobenzaprine (FLEXERIL) 10 MG tablet Take 1 tablet (10 mg total) by mouth 2 (two) times daily as needed for muscle spasms. 06/26/22   Marcello Fennel, PA-C  Dextromethorphan-guaiFENesin (MUCINEX DM MAXIMUM STRENGTH) 60-1200 MG TB12 Take 1 tablet by mouth 2 (two) times daily. 06/15/22   Enrique Sack, FNP  lamoTRIgine (LAMICTAL) 200 MG tablet Take 1 tablet (200 mg total) by mouth 2 (two) times daily. NO REFILLS> NO EXCEPTIONS 07/03/22 09/01/22  Gildardo Pounds, NP  predniSONE (STERAPRED UNI-PAK 21 TAB) 10 MG (21) TBPK tablet Take as directed 06/15/22   Enrique Sack, FNP  silver sulfADIAZINE (SILVADENE) 1 % cream Apply to affected skin areas once per day for 10 days or until area is healed 11/24/18   Antony Blackbird, MD      Allergies    Zyrtec [cetirizine]    Review of Systems   Review of Systems  Unable to perform ROS: Mental status change  Neurological:  Positive for seizures.    Physical Exam Updated Vital Signs BP (!) 146/91   Pulse (!) 117   Temp 97.6 F (36.4 C) (Axillary)   Resp 20   SpO2 96%  Physical Exam Vitals and nursing note reviewed.  Constitutional:      General: He is not in acute distress.  Appearance: Normal appearance. He is well-developed. He is obese.  HENT:     Head: Normocephalic and atraumatic.     Mouth/Throat:     Comments: Patient has signs of anterior tongue trauma.  Difficult exam due to compliance. Eyes:     Conjunctiva/sclera: Conjunctivae normal.  Cardiovascular:     Rate and Rhythm: Regular rhythm. Tachycardia present.     Heart sounds: No murmur heard. Pulmonary:     Effort: Pulmonary effort is normal. No respiratory distress.     Breath sounds: Normal breath sounds.  Abdominal:     Palpations: Abdomen is soft.     Tenderness: There is no abdominal tenderness. There is no guarding or rebound.  Musculoskeletal:        General: No swelling.      Cervical back: Neck supple.  Skin:    General: Skin is warm and dry.     Capillary Refill: Capillary refill takes less than 2 seconds.  Neurological:     Comments: Patient is not awake or following commands.  He is moving all extremities without any focal deficits.     ED Results / Procedures / Treatments   Labs (all labs ordered are listed, but only abnormal results are displayed) Labs Reviewed  BASIC METABOLIC PANEL - Abnormal; Notable for the following components:      Result Value   Sodium 134 (*)    Chloride 95 (*)    CO2 18 (*)    Glucose, Bld 150 (*)    BUN 35 (*)    Creatinine, Ser 12.38 (*)    GFR, Estimated 5 (*)    Anion gap 21 (*)    All other components within normal limits  CBC WITH DIFFERENTIAL/PLATELET - Abnormal; Notable for the following components:   WBC 14.0 (*)    RBC 3.67 (*)    Hemoglobin 12.1 (*)    HCT 37.9 (*)    MCV 103.3 (*)    Platelets 24 (*)    Neutro Abs 10.7 (*)    Monocytes Absolute 1.2 (*)    All other components within normal limits    EKG EKG Interpretation  Date/Time:  Wednesday August 08 2022 12:50:30 EST Ventricular Rate:  121 PR Interval:  66 QRS Duration: 108 QT Interval:  451 QTC Calculation: 640 R Axis:   167 Text Interpretation: Sinus tachycardia Probable left atrial enlargement Right ventricular hypertrophy Probable lateral infarct, old Prolonged QT interval increased rate and QTc from prior 1/24 Confirmed by Aletta Edouard [phone]) on 08/08/2022 1:02:45 PM  Radiology CT Head Wo Contrast  Result Date: 08/08/2022 CLINICAL DATA:  Dialysis seizure. EXAM: CT HEAD WITHOUT CONTRAST TECHNIQUE: Contiguous axial images were obtained from the base of the skull through the vertex without intravenous contrast. RADIATION DOSE REDUCTION: This exam was performed according to the departmental dose-optimization program which includes automated exposure control, adjustment of the mA and/or kV according to patient size and/or use of  iterative reconstruction technique. COMPARISON:  07/20/2020 FINDINGS: Brain: No abnormality seen affecting the brainstem. Old small vessel left cerebellar stroke. Cerebral hemispheres show advanced chronic small-vessel ischemic changes throughout the white matter. There is an old left parietal cortical and subcortical infarction with encephalomalacia. There is a small old right frontal infarction. There is a small old right parietal infarction. There is an old right occipital infarction. No evidence of mass, hemorrhage, hydrocephalus or extra-axial collection. Ventricular prominence is consistent with the degree of brain volume loss. Vascular: There is atherosclerotic calcification of the major vessels  at the base of the brain. Skull: Negative Sinuses/Orbits: Clear/normal Other: None IMPRESSION: 1. No acute finding by CT. Advanced chronic small-vessel ischemic changes throughout the white matter. Multiple old cortical infarctions as outlined above. 2. Atherosclerotic calcification of the major vessels at the base of the brain. Electronically Signed   By: Nelson Chimes M.D.   On: 08/08/2022 14:10    Procedures Procedures    Medications Ordered in ED Medications - No data to display  ED Course/ Medical Decision Making/ A&P Clinical Course as of 08/08/22 1648  Wed Aug 08, 2022  1346 Labs coming back with platelets low at 24.  They have been as low as 80 in the past but this is much lower.  Have ordered a head CT to make sure there is not any intraparenchymal bleeding causing a seizure. [MB]  [id number] Patient is now awake and alert.  He said he has been taking his Lamictal regularly. [MB]    Clinical Course User Index [MB] Hayden Rasmussen, MD                             Medical Decision Making Amount and/or Complexity of Data Reviewed Labs: ordered. Radiology: ordered.   This patient complains of seizure combativeness postictal, tongue laceration; this involves an extensive number of  treatment Options and is a complaint that carries with it a high risk of complications and morbidity. The differential includes tongue laceration, seizure, bleed, stroke, metabolic derangement, medication noncompliance,  I ordered, reviewed and interpreted labs, which included CBC elevated white count, hemoglobin low stable from priors, platelets significantly lower than priors, chemistries consistent with his CKD I ordered imaging studies which included head CT and I independently    visualized and interpreted imaging which showed no acute findings Additional history obtained from EMS Previous records obtained and reviewed in epic, multiple notes from PCP regarding his low platelets and his noncompliance with follow-up with oncology Cardiac monitoring reviewed, sinus tachycardia improving to normal sinus rhythm Social determinants considered, no significant barriers Critical Interventions: None  After the interventions stated above, I reevaluated the patient and found patient to be awake alert no airway compromise.  He is having some oozing from his tongue and his nose. Admission and further testing considered, no indications for admission at this time.  Counseled patient to continue to take his seizure medications and follow-up with neurology and oncology.  Return instructions discussed.         Final Clinical Impression(s) / ED Diagnoses Final diagnoses:  Seizure ([place])  Thrombocytopenia ([place])  Laceration of tongue, initial encounter    Rx / DC Orders ED Discharge Orders     None         Hayden Rasmussen, MD 08/08/22 1650

## 2022-08-10 ENCOUNTER — Other Ambulatory Visit: Payer: Self-pay

## 2022-08-10 ENCOUNTER — Inpatient Hospital Stay (HOSPITAL_COMMUNITY)
Admission: EM | Admit: 2022-08-10 | Discharge: 2022-08-12 | DRG: 100 | Disposition: A | Discharge status: Home / Self Care | Payer: 59 | Source: Ambulatory Visit | Attending: Internal Medicine | Admitting: Internal Medicine

## 2022-08-10 ENCOUNTER — Inpatient Hospital Stay (HOSPITAL_COMMUNITY): Payer: 59

## 2022-08-10 ENCOUNTER — Encounter (HOSPITAL_COMMUNITY): Payer: Self-pay

## 2022-08-10 ENCOUNTER — Telehealth: Payer: Self-pay | Admitting: Hematology and Oncology

## 2022-08-10 DIAGNOSIS — I6783 Posterior reversible encephalopathy syndrome: Secondary | ICD-10-CM | POA: Diagnosis present

## 2022-08-10 DIAGNOSIS — Z7901 Long term (current) use of anticoagulants: Secondary | ICD-10-CM | POA: Diagnosis not present

## 2022-08-10 DIAGNOSIS — Z888 Allergy status to other drugs, medicaments and biological substances status: Secondary | ICD-10-CM | POA: Diagnosis not present

## 2022-08-10 DIAGNOSIS — R7303 Prediabetes: Secondary | ICD-10-CM | POA: Diagnosis present

## 2022-08-10 DIAGNOSIS — Z992 Dependence on renal dialysis: Secondary | ICD-10-CM | POA: Diagnosis not present

## 2022-08-10 DIAGNOSIS — R569 Unspecified convulsions: Secondary | ICD-10-CM | POA: Diagnosis present

## 2022-08-10 DIAGNOSIS — D6862 Lupus anticoagulant syndrome: Secondary | ICD-10-CM | POA: Diagnosis present

## 2022-08-10 DIAGNOSIS — E669 Obesity, unspecified: Secondary | ICD-10-CM | POA: Diagnosis present

## 2022-08-10 DIAGNOSIS — Z87891 Personal history of nicotine dependence: Secondary | ICD-10-CM

## 2022-08-10 DIAGNOSIS — J9601 Acute respiratory failure with hypoxia: Secondary | ICD-10-CM | POA: Diagnosis present

## 2022-08-10 DIAGNOSIS — I69398 Other sequelae of cerebral infarction: Secondary | ICD-10-CM

## 2022-08-10 DIAGNOSIS — G40901 Epilepsy, unspecified, not intractable, with status epilepticus: Principal | ICD-10-CM | POA: Diagnosis present

## 2022-08-10 DIAGNOSIS — N186 End stage renal disease: Secondary | ICD-10-CM | POA: Diagnosis present

## 2022-08-10 DIAGNOSIS — D696 Thrombocytopenia, unspecified: Secondary | ICD-10-CM | POA: Diagnosis present

## 2022-08-10 DIAGNOSIS — Z91148 Patient's other noncompliance with medication regimen for other reason: Secondary | ICD-10-CM

## 2022-08-10 DIAGNOSIS — Z6831 Body mass index (BMI) 31.0-31.9, adult: Secondary | ICD-10-CM

## 2022-08-10 DIAGNOSIS — Z833 Family history of diabetes mellitus: Secondary | ICD-10-CM | POA: Diagnosis not present

## 2022-08-10 DIAGNOSIS — Z91158 Patient's noncompliance with renal dialysis for other reason: Secondary | ICD-10-CM

## 2022-08-10 DIAGNOSIS — M25512 Pain in left shoulder: Secondary | ICD-10-CM | POA: Diagnosis present

## 2022-08-10 DIAGNOSIS — I120 Hypertensive chronic kidney disease with stage 5 chronic kidney disease or end stage renal disease: Secondary | ICD-10-CM | POA: Diagnosis present

## 2022-08-10 DIAGNOSIS — Z79899 Other long term (current) drug therapy: Secondary | ICD-10-CM | POA: Diagnosis not present

## 2022-08-10 LAB — COMPREHENSIVE METABOLIC PANEL
ALT: 7 U/L (ref 0–44)
AST: 23 U/L (ref 15–41)
Albumin: 3.4 g/dL — ABNORMAL LOW (ref 3.5–5.0)
Alkaline Phosphatase: 73 U/L (ref 38–126)
Anion gap: 17 — ABNORMAL HIGH (ref 5–15)
BUN: 59 mg/dL — ABNORMAL HIGH (ref 6–20)
CO2: 26 mmol/L (ref 22–32)
Calcium: 9.3 mg/dL (ref 8.9–10.3)
Chloride: 94 mmol/L — ABNORMAL LOW (ref 98–111)
Creatinine, Ser: 17.49 mg/dL — ABNORMAL HIGH (ref 0.61–1.24)
GFR, Estimated: 3 mL/min — ABNORMAL LOW (ref >60)
Glucose, Bld: 94 mg/dL (ref 70–99)
Potassium: 4 mmol/L (ref 3.5–5.1)
Sodium: 137 mmol/L (ref 135–145)
Total Bilirubin: 0.7 mg/dL (ref 0.3–1.2)
Total Protein: 7.8 g/dL (ref 6.5–8.1)

## 2022-08-10 LAB — CBC
HCT: 34.3 % — ABNORMAL LOW (ref 39.0–52.0)
Hemoglobin: 11.5 g/dL — ABNORMAL LOW (ref 13.0–17.0)
MCH: 33.9 pg (ref 26.0–34.0)
MCHC: 33.5 g/dL (ref 30.0–36.0)
MCV: 101.2 fL — ABNORMAL HIGH (ref 80.0–100.0)
Platelets: 55 10*3/uL — ABNORMAL LOW (ref 150–400)
RBC: 3.39 MIL/uL — ABNORMAL LOW (ref 4.22–5.81)
RDW: 14.1 % (ref 11.5–15.5)
WBC: 7.4 10*3/uL (ref 4.0–10.5)
nRBC: 0 % (ref 0.0–0.2)

## 2022-08-10 LAB — CBC WITH DIFFERENTIAL/PLATELET
Abs Immature Granulocytes: 0.03 10*3/uL (ref 0.00–0.07)
Basophils Absolute: 0 10*3/uL (ref 0.0–0.1)
Basophils Relative: 0 %
Eosinophils Absolute: 0.2 10*3/uL (ref 0.0–0.5)
Eosinophils Relative: 3 %
HCT: 35 % — ABNORMAL LOW (ref 39.0–52.0)
Hemoglobin: 11.2 g/dL — ABNORMAL LOW (ref 13.0–17.0)
Immature Granulocytes: 0 %
Lymphocytes Relative: 8 %
Lymphs Abs: 0.6 10*3/uL — ABNORMAL LOW (ref 0.7–4.0)
MCH: 32.7 pg (ref 26.0–34.0)
MCHC: 32 g/dL (ref 30.0–36.0)
MCV: 102 fL — ABNORMAL HIGH (ref 80.0–100.0)
Monocytes Absolute: 0.8 10*3/uL (ref 0.1–1.0)
Monocytes Relative: 11 %
Neutro Abs: 5.7 10*3/uL (ref 1.7–7.7)
Neutrophils Relative %: 78 %
Platelets: 53 10*3/uL — ABNORMAL LOW (ref 150–400)
RBC: 3.43 MIL/uL — ABNORMAL LOW (ref 4.22–5.81)
RDW: 14.1 % (ref 11.5–15.5)
WBC: 7.3 10*3/uL (ref 4.0–10.5)
nRBC: 0 % (ref 0.0–0.2)

## 2022-08-10 LAB — PROTIME-INR
INR: 1.2 (ref 0.8–1.2)
Prothrombin Time: 14.8 seconds (ref 11.4–15.2)

## 2022-08-10 LAB — CREATININE, SERUM
Creatinine, Ser: 17.57 mg/dL — ABNORMAL HIGH (ref 0.61–1.24)
GFR, Estimated: 3 mL/min — ABNORMAL LOW (ref >60)

## 2022-08-10 LAB — ETHANOL: Alcohol, Ethyl (B): <10 mg/dL (ref <10)

## 2022-08-10 LAB — MAGNESIUM: Magnesium: 2.8 mg/dL — ABNORMAL HIGH (ref 1.7–2.4)

## 2022-08-10 LAB — HIV ANTIBODY (ROUTINE TESTING W REFLEX): HIV Screen 4th Generation wRfx: NONREACTIVE

## 2022-08-10 MED ORDER — SODIUM CHLORIDE 0.9 % IV SOLN: 250.0000 mg | INTRAVENOUS | Status: DC

## 2022-08-10 MED ORDER — LEVETIRACETAM IN NACL 500 MG/100ML IV SOLN
500.0000 mg | INTRAVENOUS | Status: DC
  Filled 2022-08-10: qty 100

## 2022-08-10 MED ORDER — HEPARIN SODIUM (PORCINE) 5000 UNIT/ML IJ SOLN
5000.0000 [IU] | Freq: Three times a day (TID) | INTRAMUSCULAR | Status: DC
  Administered 2022-08-10 – 2022-08-11 (×3): 5000 [IU] via SUBCUTANEOUS
  Filled 2022-08-10 (×3): qty 1

## 2022-08-10 MED ORDER — ONDANSETRON HCL 4 MG/2ML IJ SOLN
4.0000 mg | Freq: Once | INTRAMUSCULAR | Status: AC
  Administered 2022-08-10: 4 mg via INTRAVENOUS
  Filled 2022-08-10: qty 2

## 2022-08-10 MED ORDER — LAMOTRIGINE 100 MG PO TABS
200.0000 mg | ORAL_TABLET | Freq: Two times a day (BID) | ORAL | Status: DC
  Administered 2022-08-10 – 2022-08-11 (×2): 200 mg via ORAL
  Filled 2022-08-10: qty 8
  Filled 2022-08-10: qty 2

## 2022-08-10 MED ORDER — LEVETIRACETAM IN NACL 1000 MG/100ML IV SOLN
1000.0000 mg | Freq: Once | INTRAVENOUS | Status: AC
  Administered 2022-08-10: 1000 mg via INTRAVENOUS
  Filled 2022-08-10: qty 100

## 2022-08-10 MED ORDER — LAMOTRIGINE 100 MG PO TABS: 100.0000 mg | ORAL_TABLET | ORAL | Status: DC

## 2022-08-10 NOTE — ED Notes (Signed)
Patient states he does not make much urine, he will provide a sample of urine when able to urinate.

## 2022-08-10 NOTE — ED Notes (Signed)
When you have a chance. Please call his roommate. [name] [name]. His number is [phone]. Thanks.

## 2022-08-10 NOTE — Consult Note (Addendum)
Neurology Consultation  Reason for Consult: seizure Referring Physician: Dr. Kathrynn Humble  CC: seizures  History is obtained from: patient and chart review  HPI: [name] [name] is a 42 y.o. male with PMHx of post stroke epilepsy, lupus anticoagulant positive on Coumadin, hypertension, stroke (at age 89-25), obesity, prediabetes, ESRD on hemodialysis who presented to the hospital due to having a seizure 30 minutes into hemodialysis. Per nurse note, his seizure (upper body shakes). lasted for 3 minutes. Possibly noncompliant with his medication but when asked he stated that he takes his seizure medication at home. He was prescribed Lamictal 200 mg BID. Around 1407 today, nurse note states patient's oxygen was in the mid 50's, not following commands nor making eye contact. Keppra 1,000 mg IV given at 1419. Assessed the patient at 1500 and he seemed drowsy but was able to follow some commands and state his name and the place. Unable to state the year and month. Reassessed patient at 1520 and patient more awake and was oriented to self, place, and date.   On review of chart he does have multiple notes regarding frequent missed appointments, concerns about lamotrigine adherence, has not seen neurology outpatient since 08/04/2020 with multiple missed appointments since then  LKW: note written about first seizure at 12:39 PM TNK given?: no  IR Thrombectomy? No  ROS: Unable to obtain due to altered mental status.   Past Medical History:  Diagnosis Date   Chronic kidney disease    ESRD (end stage renal disease) ([place])    Hypertension    Lupus ([place])    Seizures ([place])    Seizures ([place])    Stroke ([place])    62 or 42 years of age.     Wears glasses    Past Surgical History:  Procedure Laterality Date   AV FISTULA PLACEMENT Left 12/30/2017   Procedure: ARTERIOVENOUS (AV) FISTULA CREATION VERSUS INSERTION OF ARTERIOVENOUS GRAFT LEFT ARM;  Surgeon: Rosetta Posner, MD;  Location: [facility];  Service: Vascular;   Laterality: Left;   [place] Left 08/15/2018   Procedure: LEFT FIRST STAGE [place];  Surgeon: Rosetta Posner, MD;  Location: [facility];  Service: Vascular;  Laterality: Left;   [place] Left 10/30/2018   Procedure: INSERTION OF GORE-TEX GRAFT LEFT UPPER ARM;  Surgeon: Waynetta Sandy, MD;  Location: [facility];  Service: Vascular;  Laterality: Left;   INSERTION OF DIALYSIS CATHETER N/A 09/20/2018   Procedure: INSERTION OF TUNNELED DIALYSIS CATHETER RIGHT INTERNAL JUGULAR;  Surgeon: Marty Heck, MD;  [facility];  Service: Vascular;  Laterality: N/A;   IR FLUORO GUIDE CV LINE RIGHT  07/16/2022   IR US GUIDE VASC ACCESS RIGHT  07/16/2022   RENAL BIOPSY      Current Facility-Administered Medications:    heparin injection 5,000 Units, 5,000 Units, Subcutaneous, Q8H, Little Ishikawa, MD  Current Outpatient Medications:    amLODipine (NORVASC) 10 MG tablet, TAKE 1 TABLET BY MOUTH AT BEDTIME, Disp: 90 tablet, Rfl: 1   amoxicillin (AMOXIL) 500 MG tablet, Take 1 tablet (500 mg total) by mouth 2 (two) times daily., Disp: 14 tablet, Rfl: 0   apixaban (ELIQUIS) 2.5 MG TABS tablet, Take 1 tablet (2.5 mg total) by mouth 2 (two) times daily., Disp: 180 tablet, Rfl: 1   atorvastatin (LIPITOR) 40 MG tablet, Take 1 tablet (40 mg total) by mouth daily., Disp: 90 tablet, Rfl: 1   benzonatate (TESSALON) 200 MG capsule, Take 1 capsule (200 mg total)  by mouth 3 (three) times daily as needed for cough., Disp: 30 capsule, Rfl: 0   carvedilol (COREG) 6.25 MG tablet, Take 1 tablet (6.25 mg total) by mouth 2 (two) times daily with a meal., Disp: 180 tablet, Rfl: 1   cinacalcet (SENSIPAR) 60 MG tablet, Take 60 mg by mouth daily., Disp: , Rfl:    cyclobenzaprine (FLEXERIL) 10 MG tablet, Take 1 tablet (10 mg total) by mouth 2 (two) times daily as needed for muscle spasms., Disp: 20 tablet, Rfl: 0   Dextromethorphan-guaiFENesin (MUCINEX DM MAXIMUM  STRENGTH) 60-1200 MG TB12, Take 1 tablet by mouth 2 (two) times daily., Disp: 20 tablet, Rfl: 0   lamoTRIgine (LAMICTAL) 200 MG tablet, Take 1 tablet (200 mg total) by mouth 2 (two) times daily. NO REFILLS> NO EXCEPTIONS, Disp: 120 tablet, Rfl: 0   predniSONE (STERAPRED UNI-PAK 21 TAB) 10 MG (21) TBPK tablet, Take as directed, Disp: 21 tablet, Rfl: 0   silver sulfADIAZINE (SILVADENE) 1 % cream, Apply to affected skin areas once per day for 10 days or until area is healed, Disp: 50 g, Rfl: 1    Family History  Problem Relation Age of Onset   Diabetes Mother    Diabetes Maternal Grandmother    Seizures Neg Hx        not on mother's side. he doesn't know about his father's side     Social History:   reports that he quit smoking about 4 years ago. His smoking use included cigarettes. He smoked an average of .25 packs per day. He has never used smokeless tobacco. He reports that he does not currently use alcohol. He reports that he does not use drugs.  Medications No current facility-administered medications for this encounter.  Current Outpatient Medications:    amLODipine (NORVASC) 10 MG tablet, TAKE 1 TABLET BY MOUTH AT BEDTIME, Disp: 90 tablet, Rfl: 1   amoxicillin (AMOXIL) 500 MG tablet, Take 1 tablet (500 mg total) by mouth 2 (two) times daily., Disp: 14 tablet, Rfl: 0   apixaban (ELIQUIS) 2.5 MG TABS tablet, Take 1 tablet (2.5 mg total) by mouth 2 (two) times daily., Disp: 180 tablet, Rfl: 1   atorvastatin (LIPITOR) 40 MG tablet, Take 1 tablet (40 mg total) by mouth daily., Disp: 90 tablet, Rfl: 1   benzonatate (TESSALON) 200 MG capsule, Take 1 capsule (200 mg total) by mouth 3 (three) times daily as needed for cough., Disp: 30 capsule, Rfl: 0   carvedilol (COREG) 6.25 MG tablet, Take 1 tablet (6.25 mg total) by mouth 2 (two) times daily with a meal., Disp: 180 tablet, Rfl: 1   cinacalcet (SENSIPAR) 60 MG tablet, Take 60 mg by mouth daily., Disp: , Rfl:    cyclobenzaprine (FLEXERIL)  10 MG tablet, Take 1 tablet (10 mg total) by mouth 2 (two) times daily as needed for muscle spasms., Disp: 20 tablet, Rfl: 0   Dextromethorphan-guaiFENesin (MUCINEX DM MAXIMUM STRENGTH) 60-1200 MG TB12, Take 1 tablet by mouth 2 (two) times daily., Disp: 20 tablet, Rfl: 0   lamoTRIgine (LAMICTAL) 200 MG tablet, Take 1 tablet (200 mg total) by mouth 2 (two) times daily. NO REFILLS> NO EXCEPTIONS, Disp: 120 tablet, Rfl: 0   predniSONE (STERAPRED UNI-PAK 21 TAB) 10 MG (21) TBPK tablet, Take as directed, Disp: 21 tablet, Rfl: 0   silver sulfADIAZINE (SILVADENE) 1 % cream, Apply to affected skin areas once per day for 10 days or until area is healed, Disp: 50 g, Rfl: 1   Exam: Current vital  signs: BP (!) 174/116   Pulse 83   Temp 98.1 F (36.7 C) (Oral)   Resp 19   Ht 6' (1.829 m)   Wt 105.4 kg   SpO2 100%   BMI 31.51 kg/m  Vital signs in last 24 hours: Temp:  [98.1 F (36.7 C)] 98.1 F (36.7 C) (02/23 1106) Pulse Rate:  [83-93] 83 (02/23 1423) Resp:  [18-20] 19 (02/23 1423) BP: (145-174)/(104-118) 174/116 (02/23 1410) SpO2:  [55 %-100 %] 100 % (02/23 1423) Weight:  [105.4 kg] 105.4 kg (02/23 1108)  GENERAL: Awake, partially alert but would easily fall asleep, awakens to verbal stimuli, can follow commands  Head: Normocephalic and atraumatic, without obvious abnormality EENT: Normal conjunctivae, dry mucous membranes LUNGS: Normal respiratory effort. Non-labored breathing on 3 L [place] CV: Regular rate and rhythm on telemetry ABDOMEN: Soft, non-tender, non-distended Extremities: warm, well perfused, without obvious deformity  NEURO:  Mental Status: Awake but would easily fall asleep, alert, and partially oriented to person, place, and time (although initially not knowing the month) He is unable to provide a clear history but able to state that he had a seizure Speech/Language: speech is slurred  Cranial Nerves:  III, IV, VI: EOMI. Lid elevation symmetric and full.  V: Sensation is  intact to light touch  VII: Face is symmetric resting   VIII: Hearing intact to voice XII: Tongue protrudes midline without fasciculations.    The rest of CN exam limited as patient had difficulty following some commands.  Motor: 5/5 strength is all muscle groups.  Tone is normal. Bulk is normal.  Sensation: Intact to light touch bilaterally in all four extremities  Coordination: Fair, unable to formally assess FTN but able to raise arms and touch examiner's hand Gait: Deferred   Labs I have reviewed labs in epic and the results pertinent to this consultation are:   CBC    Component Value Date/Time   WBC 7.3 08/10/2022 1142   RBC 3.43 (L) 08/10/2022 1142   HGB 11.2 (L) 08/10/2022 1142   HGB 13.0 09/27/2020 1140   HGB 15.5 10/14/2013 1434   HCT 35.0 (L) 08/10/2022 1142   HCT 38.5 09/27/2020 1140   HCT 45.6 10/14/2013 1434   PLT 53 (L) 08/10/2022 1142   PLT 69 (LL) 09/27/2020 1140   MCV 102.0 (H) 08/10/2022 1142   MCV 96 09/27/2020 1140   MCV 87.2 10/14/2013 1434   MCH 32.7 08/10/2022 1142   MCHC 32.0 08/10/2022 1142   RDW 14.1 08/10/2022 1142   RDW 14.6 09/27/2020 1140   RDW 13.0 10/14/2013 1434   LYMPHSABS 0.6 (L) 08/10/2022 1142   LYMPHSABS 1.2 09/27/2020 1140   LYMPHSABS 1.4 10/14/2013 1434   MONOABS 0.8 08/10/2022 1142   MONOABS 0.5 10/14/2013 1434   EOSABS 0.2 08/10/2022 1142   EOSABS 0.2 09/27/2020 1140   BASOSABS 0.0 08/10/2022 1142   BASOSABS 0.0 09/27/2020 1140   BASOSABS 0.0 10/14/2013 1434    CMP     Component Value Date/Time   NA 137 08/10/2022 1142   NA 138 09/27/2020 1140   K 4.0 08/10/2022 1142   CL 94 (L) 08/10/2022 1142   CO2 26 08/10/2022 1142   GLUCOSE 94 08/10/2022 1142   BUN 59 (H) 08/10/2022 1142   BUN 50 (H) 09/27/2020 1140   CREATININE 17.49 (H) 08/10/2022 1142   CREATININE 15.72 (H) 01/24/2021 0938   CALCIUM 9.3 08/10/2022 1142   CALCIUM 8.4 (L) 12/23/2017 0237   PROT 7.8 08/10/2022 1142  PROT 8.0 09/27/2020 1140   ALBUMIN  3.4 (L) 08/10/2022 1142   ALBUMIN 4.6 09/27/2020 1140   AST 23 08/10/2022 1142   ALT 7 08/10/2022 1142   ALKPHOS 73 08/10/2022 1142   BILITOT 0.7 08/10/2022 1142   BILITOT 0.4 09/27/2020 1140   GFRNONAA 3 (L) 08/10/2022 1142   GFRNONAA 40 (L) 10/24/2015 1603   GFRAA 4 (L) 01/14/2020 2020   GFRAA 47 (L) 10/24/2015 1603    Lipid Panel     Component Value Date/Time   CHOL 194 09/27/2020 1140   TRIG 138 09/27/2020 1140   HDL 56 09/27/2020 1140   CHOLHDL 3.5 09/27/2020 1140   LDLCALC 114 (H) 09/27/2020 1140   Lab Results  Component Value Date   INR 1.2 08/10/2022   INR 1.0 07/20/2020   INR 1.0 10/30/2018   PROTIME 28.8 (H) 11/04/2013   PROTIME 21.6 (H) 10/14/2013   PROTIME 18.0 (H) 09/23/2013      Imaging  CT-scan of the brain 08/08/2022 1. No acute finding by CT. Advanced chronic small-vessel ischemic changes throughout the white matter. Multiple old cortical infarctions as outlined above. 2. Atherosclerotic calcification of the major vessels at the base of the brain.  MRI examination of the brain pending  Assessment: DECKLYN HAINS is a 42 y.o. male with PMHx of seizure disorder, ESRD on hemodialysis who presented to the hospital due to having a seizure 30 minutes into hemodialysis and another seizure with hypoxia, O2 sat in the 50's without return to baseline meeting criteria for status epilepticus. However now improving after being loaded with Keppra.  At this time to postictal to reliably report whether or not he has been adherent with his medications.  Gives variable reports to different providers which is also documented as a pattern in his history.  Impression:  -Breakthrough seizure -Possible medication non-adherence -Possible posterior reversible encephalopathy syndrome given significant hypertension and ESRD -Possible need for escalation of prior antiseizure regimen  Recommendations: - MRI brain without contrast, stat to evaluate for PRES, if this is positive  for PRES blood pressure goal should be SBP 120-140 - Head CT, chest x-ray, Abdominal x-ray for MRI screening - Lamictal level pending - Continue Lamictal at home dose of 200 twice daily with 100 additional after dialysis - S/p Keppra 1 g loading dose, add 500 mg daily for now pending confirmation of lamotrigine adherence based on level - Hold off on EEG at this time, however if he is not returning to baseline after loading Keppra or MRI is concerning for PRES will need long-term EEG monitoring - Neurology will follow along  Coralyn Pear, MD PGY-1 Psychiatry   Attending Neurologist's note:  I personally saw this patient, gathering history, performing a full neurologic examination, reviewing relevant labs, personally reviewing relevant imaging including head CT, and formulated the assessment and plan, adding the note above for completeness and clarity to accurately reflect my thoughts  Lesleigh Noe MD-PhD [facility] [phone] Available 7 AM to 7 PM, outside these hours please contact Neurologist on call listed on AMION

## 2022-08-10 NOTE — H&P (Addendum)
History and Physical    [name] [name] [id number] DOB: March 04, 1981 DOA: 08/10/2022  PCP: Gildardo Pounds, NP   Chief Complaint: Seizure, recurrent  HPI: [name] [name] is a 42 y.o. male with medical history significant of seizure, ESRD on dialysis MWF via RIJ tunneled catheter, Lupus, prior stroke - who presents after suffering a seizure during dialysis. He was only able to complete 30 min of HD before he was found to be altered and shaking. Patient was recently evaluated in the ED on 08/08/22 for similar episode, he appears to be quite noncompliant with his home meds(lamictal) and cannot recall the last time he took this medication.  ED Course: In the ED patient had another seizure - was loaded with keppra. Neuro and nephrology consulted - hospitalist called for admission.  Review of Systems: As per HPI, unable to obtain due to patient's mental status.   Assessment/Plan Principal Problem:   Status epilepticus ([place])    Acute recurrent seizure, rule out status epilepticus/PRES Exacerbated by noncompliance -Noncompliant with lamictal - keppra loaded in ED -Neuro consulted - appreciate insight/recommendations -CT/MRI head pending per Neuro recs to rule out any central etiology -Unclear if he will require further EEG/testing given his recurrent episodes and known noncompliance.  Acute hypoxic respiratory failure -Likely secondary to above -CXR with bibasilar opacity - unclear if fluid from missed dialysis vs aspiration pneumonitis -Continue to wean oxygen - hold abx for now - follow clinically  ESRD on HD MWF via RIJ tunneled catheter -Only completed 105mn of HD today -Nephro consulted - defer for ongoing HD -Appears euvolemic; K WNL, Mg 2.8 - no indication for emergent dialysis at this time  Medication/HD noncompliance -Discussed the risks of ongoing noncompliance - no family in the chart - unclear if he has any support system.  Lupus CVA history -Stable - no workup  indicated at this time  DVT prophylaxis: Heparin Code Status: Full  Family Communication: None available  Status is: Inpt  Dispo: The patient is from: Home              Anticipated d/c is to: Home              Anticipated d/c date is: TBD              Patient currently NOT medically stable for discharge given ongoing need for evaluation for ongoing recurrent seizures  Consultants:  Neuro, Nephro  Procedures:  None   Past Medical History:  Diagnosis Date   Chronic kidney disease    ES[place] ([place] stage renal disease) ([place]    Hypertension    [place] ([place]    [place]izures ([place]    [place] ([place]    [place]roke ([place]    89or 42years of age.     Wears glasses     Past Surgical History:  Procedure Laterality Date   AV FISTULA PLACEMENT Left 12/30/2017   Procedure: ARTERIOVENOUS (AV) FISTULA CREATION VERSUS INSERTION OF ARTERIOVENOUS GRAFT LEFT ARM;  Surgeon: ERosetta Posner MD;  [facility]  Service: Vascular;  Laterality: Left;   [place] 08/15[place]   Procedure: LEFT FIRST STAGE [place]  Surgeon: ERosetta Posner MD;  [place]  Service: Vascular;  Laterality: Left;   [place] 10[place]   Procedure: INSERTION OF GORE-TEX GRAFT LEFT UPPER ARM;  Surgeon: CWaynetta Sandy MD;  [facility]  Service: Vascular;  Laterality: Left;   INSERTION OF DIALYSIS CATHETER N/A  09/20/2018   Procedure: INSERTION OF TUNNELED DIALYSIS CATHETER RIGHT INTERNAL JUGULAR;  Surgeon: Marty Heck, MD;  Location: [facility];  Service: Vascular;  Laterality: N/A;   IR FLUORO GUIDE CV LINE RIGHT  07/16/2022   IR US GUIDE VASC ACCESS RIGHT  07/16/2022   RENAL BIOPSY       reports that he quit smoking about 4 years ago. His smoking use included cigarettes. He smoked an average of .25 packs per day. He has never used smokeless tobacco. He reports that he does not currently use alcohol. He reports that he does not use  drugs.  Allergies  Allergen Reactions   Zyrtec [Cetirizine] Swelling    FACIAL SWELLING, NOT CATEGORIZED    Family History  Problem Relation Age of Onset   Diabetes Mother    Diabetes Maternal Grandmother    Seizures Neg Hx        not on mother's side. he doesn't know about his father's side    Prior to Admission medications   Medication Sig Start Date End Date Taking? Authorizing Provider  amLODipine (NORVASC) 10 MG tablet TAKE 1 TABLET BY MOUTH AT BEDTIME 03/07/22 03/07/23  Argentina Donovan, PA-C  amoxicillin (AMOXIL) 500 MG tablet Take 1 tablet (500 mg total) by mouth 2 (two) times daily. 06/15/22   Enrique Sack, FNP  apixaban (ELIQUIS) 2.5 MG TABS tablet Take 1 tablet (2.5 mg total) by mouth 2 (two) times daily. 03/07/22 05/06/22  Argentina Donovan, PA-C  atorvastatin (LIPITOR) 40 MG tablet Take 1 tablet (40 mg total) by mouth daily. 03/07/22   Argentina Donovan, PA-C  benzonatate (TESSALON) 200 MG capsule Take 1 capsule (200 mg total) by mouth 3 (three) times daily as needed for cough. 06/15/22   Enrique Sack, FNP  carvedilol (COREG) 6.25 MG tablet Take 1 tablet (6.25 mg total) by mouth 2 (two) times daily with a meal. 03/07/22 04/06/22  Argentina Donovan, PA-C  cinacalcet (SENSIPAR) 60 MG tablet Take 60 mg by mouth daily. 02/02/21   [provider]  cyclobenzaprine (FLEXERIL) 10 MG tablet Take 1 tablet (10 mg total) by mouth 2 (two) times daily as needed for muscle spasms. 06/26/22   Marcello Fennel, PA-C  Dextromethorphan-guaiFENesin (MUCINEX DM MAXIMUM STRENGTH) 60-1200 MG TB12 Take 1 tablet by mouth 2 (two) times daily. 06/15/22   Enrique Sack, FNP  lamoTRIgine (LAMICTAL) 200 MG tablet Take 1 tablet (200 mg total) by mouth 2 (two) times daily. NO REFILLS> NO EXCEPTIONS 07/03/22 09/01/22  Gildardo Pounds, NP  predniSONE (STERAPRED UNI-PAK 21 TAB) 10 MG (21) TBPK tablet Take as directed 06/15/22   Enrique Sack, FNP  silver sulfADIAZINE (SILVADENE) 1 %  cream Apply to affected skin areas once per day for 10 days or until area is healed 11/24/18   Antony Blackbird, MD    Physical Exam: Vitals:   08/10/22 1423 08/10/22 1500 08/10/22 1559 08/10/22 1600  BP:  (!) 145/114  (!) 153/100  Pulse: 83 83  81  Resp: 19   19  Temp:   98.5 F (36.9 C)   TempSrc:   Oral   SpO2: 100% 100%  100%  Weight:      Height:        Constitutional: NAD, calm, comfortable Vitals:   08/10/22 1423 08/10/22 1500 08/10/22 1559 08/10/22 1600  BP:  (!) 145/114  (!) 153/100  Pulse: 83 83  81  Resp: 19   19  Temp:   98.5 F (36.9 C)  TempSrc:   Oral   SpO2: 100% 100%  100%  Weight:      Height:       General:  Pleasantly resting in bed, No acute distress. HEENT:  Normocephalic atraumatic.  Sclerae nonicteric, noninjected.  Extraocular movements intact bilaterally. Neck:  Without mass or deformity.  Trachea is midline. Chest:  Clear to auscultate bilaterally without rhonchi, wheeze, or rales. RIJ tunneled catheter. Heart:  Regular rate and rhythm.  Without murmurs, rubs, or gallops. Abdomen:  Soft, nontender, nondistended.  Without guarding or rebound. Extremities: Without cyanosis, clubbing, edema, or obvious deformity. Vascular:  Dorsalis pedis and posterior tibial pulses palpable bilaterally. Skin:  Warm and dry, no erythema, no ulcerations.  Labs on Admission: I have personally reviewed following labs and imaging studies  CBC: Recent Labs  Lab 08/08/22 1244 08/10/22 1142  WBC 14.0* 7.3  NEUTROABS 10.7* 5.7  HGB 12.1* 11.2*  HCT 37.9* 35.0*  MCV 103.3* 102.0*  PLT 24* 53*   Basic Metabolic Panel: Recent Labs  Lab 08/08/22 1244 08/10/22 1142  NA 134* 137  K 3.5 4.0  CL 95* 94*  CO2 18* 26  GLUCOSE 150* 94  BUN 35* 59*  CREATININE 12.38* 17.49*  CALCIUM 9.2 9.3  MG  --  2.8*   GFR: Estimated Creatinine Clearance: 6.8 mL/min (A) (by C-G formula based on SCr of 17.49 mg/dL (H)).  Liver Function Tests: Recent Labs  Lab 08/10/22 1142   AST 23  ALT 7  ALKPHOS 73  BILITOT 0.7  PROT 7.8  ALBUMIN 3.4*   Coagulation Profile: Recent Labs  Lab 08/10/22 1142  INR 1.2   Urine analysis:    Component Value Date/Time   COLORURINE STRAW (A) 12/21/2017 0344   APPEARANCEUR CLEAR 12/21/2017 0344   LABSPEC 1.009 12/21/2017 0344   PHURINE 6.0 12/21/2017 0344   GLUCOSEU NEGATIVE 12/21/2017 0344   HGBUR SMALL (A) 12/21/2017 0344   BILIRUBINUR NEGATIVE 12/21/2017 0344   KETONESUR NEGATIVE 12/21/2017 0344   PROTEINUR 100 (A) 12/21/2017 0344   UROBILINOGEN 0.2 05/09/2013 1857   NITRITE NEGATIVE 12/21/2017 0344   LEUKOCYTESUR NEGATIVE 12/21/2017 0344    Radiological Exams on Admission: No results found.  EKG: Independently reviewed. Sinus tachycardia - probably RV hypertrophy   Little Ishikawa DO [facility] For contact please use secure messenger on Epic  If 7PM-7AM, please contact night-coverage located on [email or web address]   08/10/2022, 4:34 PM

## 2022-08-10 NOTE — Telephone Encounter (Signed)
Scheduled appt per 2/22 referral. Pt is aware of appt date and time. Pt is aware to arrive 15 mins prior to appt time and to bring and updated insurance card. Pt is aware of appt location.

## 2022-08-10 NOTE — ED Notes (Signed)
ED TO INPATIENT HANDOFF REPORT  ED Nurse Name and Phone #: [phone]  S Name/Age/Gender [name] [name] 42 y.o. male Room/Bed: 033C/033C  Code Status   Code Status: Full Code  Home/SNF/Other Home Patient oriented to: self, place, time, and situation Is this baseline? Yes   Triage Complete: Triage complete  Chief Complaint Status epilepticus ([place]) [id number]  Triage Note Pt brought in by EMS from dialysis; was 30 minutes into treatment when he had a seizure (upper body shakes) that lasted for 3 minutes. Pt has hx of seizures and is known to be noncompliant with dialysis treatments and medication. Pt reports only doing a partial treatment of dialysis on Wednesday. Currently pt is oriented to self, place and situation but disoriented to month/year.    Allergies Allergies  Allergen Reactions   Zyrtec [Cetirizine] Swelling    FACIAL SWELLING, NOT CATEGORIZED    Level of Care/Admitting Diagnosis ED Disposition     ED Disposition  Admit   Condition  --   Comment  Hospital Area: [place] [[id number]]  Level of Care: Progressive [102]  Admit to Progressive based on following criteria: NEUROLOGICAL AND NEUROSURGICAL complex patients with significant risk of instability, who do not meet ICU criteria, yet require close observation or frequent assessment (< / = every 2 - 4 hours) with medical / nursing intervention.  May admit patient to [facility] or [facility] if equivalent level of care is available:: No  Covid Evaluation: Confirmed COVID Negative  Diagnosis: Status epilepticus [facility]) [id number]  Admitting Physician: Little Ishikawa [id number]  Attending Physician: Little Ishikawa [id number]  Certification:: I certify this patient will need inpatient services for at least 2 midnights  Estimated Length of Stay: 4          B Medical/Surgery History Past Medical History:  Diagnosis Date   Chronic kidney disease    ESRD (end stage renal disease)  ([place])    Hypertension    Lupus ([place])    Seizures ([place])    Seizures ([place])    Stroke ([place])    19 or 42 years of age.     Wears glasses    Past Surgical History:  Procedure Laterality Date   AV FISTULA PLACEMENT Left 12/30/2017   Procedure: ARTERIOVENOUS (AV) FISTULA CREATION VERSUS INSERTION OF ARTERIOVENOUS GRAFT LEFT ARM;  Surgeon: Rosetta Posner, MD;  Location: [place];  Service: Vascular;  Laterality: Left;   [place] Left 08/15/2018   Procedure: LEFT FIRST STAGE [place];  Surgeon: Rosetta Posner, MD;  Location: [place];  Service: Vascular;  Laterality: Left;   [place] Left 10/30/2018   Procedure: INSERTION OF GORE-TEX GRAFT LEFT UPPER ARM;  Surgeon: Waynetta Sandy, MD;  Location: [place];  Service: Vascular;  Laterality: Left;   INSERTION OF DIALYSIS CATHETER N/A 09/20/2018   Procedure: INSERTION OF TUNNELED DIALYSIS CATHETER RIGHT INTERNAL JUGULAR;  Surgeon: Marty Heck, MD;  Location: [place];  Service: Vascular;  Laterality: N/A;   IR FLUORO GUIDE CV LINE RIGHT  07/16/2022   IR US GUIDE VASC ACCESS RIGHT  07/16/2022   RENAL BIOPSY       A IV Location/Drains/Wounds Patient Lines/Drains/Airways Status     Active Line/Drains/Airways     Name Placement date Placement time Site Days   Peripheral IV 08/10/22 20 G Anterior;Right Forearm 08/10/22  --  Forearm  less than 1   Fistula / Graft Left Forearm Arteriovenous fistula 12/30/17  1228  Forearm  1684   Fistula / Graft Left Upper arm Arteriovenous vein graft 10/30/18  1315  Upper arm  1380   Hemodialysis Catheter Right Internal jugular Double-lumen;Permanent 09/20/18  1122  Internal jugular  1420   Hemodialysis Catheter Right Internal jugular Double lumen Permanent (Tunneled) 07/16/22  1625  Internal jugular  25            Intake/Output Last 24 hours  Intake/Output Summary (Last 24 hours) at 08/10/2022 1914 Last data filed at 08/10/2022 1434 Gross per 24 hour   Intake 100 ml  Output --  Net 100 ml    Labs/Imaging Results for orders placed or performed during the hospital encounter of 08/10/22 (from the past 48 hour(s))  CBC with Differential     Status: Abnormal   Collection Time: 08/10/22 11:42 AM  Result Value Ref Range   WBC 7.3 4.0 - 10.5 K/uL   RBC 3.43 (L) 4.22 - 5.81 MIL/uL   Hemoglobin 11.2 (L) 13.0 - 17.0 g/dL   HCT 35.0 (L) 39.0 - 52.0 %   MCV 102.0 (H) 80.0 - 100.0 fL   MCH 32.7 26.0 - 34.0 pg   MCHC 32.0 30.0 - 36.0 g/dL   RDW 14.1 11.5 - 15.5 %   Platelets 53 (L) 150 - 400 K/uL    Comment: Immature Platelet Fraction may be clinically indicated, consider ordering this additional test [id number] REPEATED TO VERIFY    nRBC 0.0 0.0 - 0.2 %   Neutrophils Relative % 78 %   Neutro Abs 5.7 1.7 - 7.7 K/uL   Lymphocytes Relative 8 %   Lymphs Abs 0.6 (L) 0.7 - 4.0 K/uL   Monocytes Relative 11 %   Monocytes Absolute 0.8 0.1 - 1.0 K/uL   Eosinophils Relative 3 %   Eosinophils Absolute 0.2 0.0 - 0.5 K/uL   Basophils Relative 0 %   Basophils Absolute 0.0 0.0 - 0.1 K/uL   Immature Granulocytes 0 %   Abs Immature Granulocytes 0.03 0.00 - 0.07 K/uL    Comment: Performed at [facility], [address], [place], [place] [address]  Comprehensive metabolic panel     Status: Abnormal   Collection Time: 08/10/22 11:42 AM  Result Value Ref Range   Sodium 137 135 - 145 mmol/L   Potassium 4.0 3.5 - 5.1 mmol/L   Chloride 94 (L) 98 - 111 mmol/L   CO2 26 22 - 32 mmol/L   Glucose, Bld 94 70 - 99 mg/dL    Comment: Glucose reference range applies only to samples taken after fasting for at least 8 hours.   BUN 59 (H) 6 - 20 mg/dL   Creatinine, Ser 17.49 (H) 0.61 - 1.24 mg/dL   Calcium 9.3 8.9 - 10.3 mg/dL   Total Protein 7.8 6.5 - 8.1 g/dL   Albumin 3.4 (L) 3.5 - 5.0 g/dL   AST 23 15 - 41 U/L   ALT 7 0 - 44 U/L   Alkaline Phosphatase 73 38 - 126 U/L   Total Bilirubin 0.7 0.3 - 1.2 mg/dL   GFR, Estimated 3 (L) >60 mL/min     Comment: (NOTE) Calculated using the CKD-EPI Creatinine Equation (2021)    Anion gap 17 (H) 5 - 15    Comment: Performed at [facility], [place] [address] [place], [place] [address]  Protime-INR     Status: None   Collection Time: 08/10/22 11:42 AM  Result Value Ref Range   Prothrombin Time 14.8 11.4 - 15.2  seconds   INR 1.2 0.8 - 1.2    Comment: (NOTE) INR goal varies based on device and disease states. Performed at [facility], [place] [address], [place], [place] [address]   Ethanol     Status: None   Collection Time: 08/10/22 11:42 AM  Result Value Ref Range   Alcohol, Ethyl (B) <10 <10 mg/dL    Comment: (NOTE) Lowest detectable limit for serum alcohol is 10 mg/dL.  For medical purposes only. Performed at [facility], [place] [address] [place], [place] [address]   Magnesium     Status: Abnormal   Collection Time: 08/10/22 11:42 AM  Result Value Ref Range   Magnesium 2.8 (H) 1.7 - 2.4 mg/dL    Comment: Performed at [facility] [address], [place], [place] [address]  CBC     Status: Abnormal   Collection Time: 08/10/22  5:18 PM  Result Value Ref Range   WBC 7.4 4.0 - 10.5 K/uL   RBC 3.39 (L) 4.22 - 5.81 MIL/uL   Hemoglobin 11.5 (L) 13.0 - 17.0 g/dL   HCT 34.3 (L) 39.0 - 52.0 %   MCV 101.2 (H) 80.0 - 100.0 fL   MCH 33.9 26.0 - 34.0 pg   MCHC 33.5 30.0 - 36.0 g/dL   RDW 14.1 11.5 - 15.5 %   Platelets 55 (L) 150 - 400 K/uL    Comment: Immature Platelet Fraction may be clinically indicated, consider ordering this additional test [id number] REPEATED TO VERIFY PLATELET COUNT CONFIRMED BY SMEAR    nRBC 0.0 0.0 - 0.2 %    Comment: Performed at [facility], [place] [address], [place], [place] [address]  Creatinine, serum     Status: Abnormal   Collection Time: 08/10/22  5:18 PM  Result Value Ref Range   Creatinine, Ser 17.57 (H) 0.61 - 1.24 mg/dL   GFR, Estimated 3 (L) >60 mL/min    Comment: (NOTE) Calculated using the CKD-EPI Creatinine  Equation (2021) Performed at [facility] [address], [place], [place] [address]    MR BRAIN WO CONTRAST  Result Date: 08/10/2022 CLINICAL DATA:  Rule out posterior reversible encephalopathy EXAM: MRI HEAD WITHOUT CONTRAST TECHNIQUE: Multiplanar, multiecho pulse sequences of the brain and surrounding structures were obtained without intravenous contrast. COMPARISON:  CT Head 08/08/22 FINDINGS: Limitations: Assessment is limited due to motion artifact on multiple sequences. Brain: No hydrocephalus. Negative for acute infarct. No hemorrhage. There is extensive periventricular T2/FLAIR hyperintense signal abnormality which correlates with the regions of hypodensity seen on CT brain dated 04/29/2019. No convincing finding to suggest progressed. No extra-axial fluid collection. Vascular: Normal flow voids. Skull and upper cervical spine: Normal marrow signal Sinuses/Orbits: No mastoid or middle ear effusion. Mucosal thickening left maxillary sinus. Orbits are unremarkable. Other: Non IMPRESSION: 1. Within the limitations of a motion degraded exam, no acute intracranial abnormality. No convincing finding to suggest PRES 2. Extensive periventricular T2/FLAIR hyperintense signal abnormality which correlates with the regions of hypodensity seen on CT brain dated 04/29/2019. This is favored to represent changes related to a combination of chronic frontal parietal cortical infarcts superimposed on a background of severe chronic microvascular ischemic Electronically Signed   By: Marin Roberts M.D.   On: 08/10/2022 18:38   DG CHEST PORT 1 VIEW  Result Date: 08/10/2022 CLINICAL DATA:  Altered mental status EXAM: PORTABLE CHEST 1 VIEW COMPARISON:  07/16/2022 FINDINGS: Normal cardiac silhouette. Large for central venous line tip in the  distal SVC. Bibasilar mild airspace disease. No pneumothorax. IMPRESSION: Bibasilar airspace disease representing edema or infection. Electronically Signed   By: Suzy Bouchard  M.D.   On: 08/10/2022 17:18   DG Abd 1 View  Result Date: 08/10/2022 CLINICAL DATA:  Altered mental status EXAM: ABDOMEN - 1 VIEW COMPARISON:  None available FINDINGS: No dilated loops of bowel to indicate ileus or obstruction. No suspicious calcifications are identified within the visualized abdomen. IMPRESSION: Nonobstructive, nonspecific bowel gas pattern. Electronically Signed   By: Miachel Roux M.D.   On: 08/10/2022 17:13    Pending Labs Unresulted Labs (From admission, onward)     Start     Ordered   08/11/22 0500  CBC  Daily,   R      08/10/22 1633   08/11/22 [id number]  Basic metabolic panel  Daily,   R      08/10/22 1633   08/10/22 1632  HIV Antibody (routine testing w rflx)  (HIV Antibody (Routine testing w reflex) panel)  Once,   R        08/10/22 1633   08/10/22 1131  Rapid urine drug screen (hospital performed)  ONCE - STAT,   STAT        08/10/22 1130   08/10/22 1130  Lamotrigine level  Once,   URGENT        08/10/22 1130            Vitals/Pain Today's Vitals   08/10/22 1500 08/10/22 1559 08/10/22 1600 08/10/22 1811  BP: (!) 145/114  (!) 153/100 (!) 162/123  Pulse: 83  81 80  Resp:   19 19  Temp:  98.5 F (36.9 C)    TempSrc:  Oral    SpO2: 100%  100% 99%  Weight:      Height:      PainSc:        Isolation Precautions No active isolations  Medications Medications  heparin injection 5,000 Units (5,000 Units Subcutaneous Given 08/10/22 1715)  lamoTRIgine (LAMICTAL) tablet 200 mg (has no administration in time range)    And  lamoTRIgine (LAMICTAL) tablet 100 mg (has no administration in time range)  levETIRAcetam (KEPPRA) IVPB 500 mg/100 mL premix (has no administration in time range)    And  levETIRAcetam (KEPPRA) 250 mg in sodium chloride 0.9 % 100 mL IVPB (has no administration in time range)  levETIRAcetam (KEPPRA) IVPB 1000 mg/100 mL premix (0 mg Intravenous Stopped 08/10/22 1434)  ondansetron (ZOFRAN) injection 4 mg (4 mg Intravenous Given 08/10/22 1419)     Mobility Walks (Limited today due to seizure)     Focused Assessments Neuro Assessment Handoff:  Swallow screen pass? No          Neuro Assessment: Exceptions to [facility]DL Neuro Checks:      Has TPA been given? No If patient is a Neuro Trauma and patient is going to OR before floor call report to [place] nurse: [phone] or [phone]   R Recommendations: See Admitting Provider Note  Report given to:   Additional Notes:  Patient will be sent to the unit when bed is marked ready/cleaned, in the main time, please call me if you have a question

## 2022-08-10 NOTE — ED Provider Notes (Signed)
[facility] Provider Note   CSN: [id number] Arrival date & time: 08/10/22  1100     History CKD, Seizures Chief Complaint  Patient presents with   Seizures    [name] [name] is a 42 y.o. male.  42 year old male with a past medical history of CKD, seizure disorder on Lamictal according to his chart presents to the ED via EMS from dialysis after having a tonic-clonic seizure for approximately 3 minutes.  Report obtained by EMS states that patient was about 30 minutes into his treatment, he does have a history of seizures noncompliant with medication or dialysis treatments.  Patient did receive partial dialysis on Wednesday.  He does report taking his Lamictal yesterday but did not take it today because "I forgot", he is a very poor historian and does not answer any of my questions.   The history is provided by the patient and medical records.  Seizures      Home Medications Prior to Admission medications   Medication Sig Start Date End Date Taking? Authorizing Provider  amLODipine (NORVASC) 10 MG tablet TAKE 1 TABLET BY MOUTH AT BEDTIME 03/07/22 03/07/23  Argentina Donovan, PA-C  amoxicillin (AMOXIL) 500 MG tablet Take 1 tablet (500 mg total) by mouth 2 (two) times daily. 06/15/22   Enrique Sack, FNP  apixaban (ELIQUIS) 2.5 MG TABS tablet Take 1 tablet (2.5 mg total) by mouth 2 (two) times daily. 03/07/22 05/06/22  Argentina Donovan, PA-C  atorvastatin (LIPITOR) 40 MG tablet Take 1 tablet (40 mg total) by mouth daily. 03/07/22   Argentina Donovan, PA-C  benzonatate (TESSALON) 200 MG capsule Take 1 capsule (200 mg total) by mouth 3 (three) times daily as needed for cough. 06/15/22   Enrique Sack, FNP  carvedilol (COREG) 6.25 MG tablet Take 1 tablet (6.25 mg total) by mouth 2 (two) times daily with a meal. 03/07/22 04/06/22  Argentina Donovan, PA-C  cinacalcet (SENSIPAR) 60 MG tablet Take 60 mg by mouth daily. 02/02/21   [provider]  cyclobenzaprine (FLEXERIL) 10 MG tablet Take 1 tablet (10 mg total) by mouth 2 (two) times daily as needed for muscle spasms. 06/26/22   Marcello Fennel, PA-C  Dextromethorphan-guaiFENesin (MUCINEX DM MAXIMUM STRENGTH) 60-1200 MG TB12 Take 1 tablet by mouth 2 (two) times daily. 06/15/22   Enrique Sack, FNP  lamoTRIgine (LAMICTAL) 200 MG tablet Take 1 tablet (200 mg total) by mouth 2 (two) times daily. NO REFILLS> NO EXCEPTIONS 07/03/22 09/01/22  Gildardo Pounds, NP  predniSONE (STERAPRED UNI-PAK 21 TAB) 10 MG (21) TBPK tablet Take as directed 06/15/22   Enrique Sack, FNP  silver sulfADIAZINE (SILVADENE) 1 % cream Apply to affected skin areas once per day for 10 days or until area is healed 11/24/18   Antony Blackbird, MD      Allergies    Zyrtec [cetirizine]    Review of Systems   Review of Systems  Constitutional:  Negative for chills and fever.  Respiratory:  Negative for shortness of breath.   Cardiovascular:  Negative for chest pain.  Gastrointestinal:  Negative for abdominal pain, nausea and vomiting.  Genitourinary:  Negative for flank pain.  Neurological:  Positive for seizures.  All other systems reviewed and are negative.   Physical Exam Updated Vital Signs BP (!) 145/114   Pulse 83   Temp 98.5 F (36.9 C) (Oral)   Resp 19   Ht 6' (1.829 m)   Wt 105.4 kg  SpO2 100%   BMI 31.51 kg/m  Physical Exam Vitals and nursing note reviewed.  Constitutional:      Appearance: Normal appearance.     Comments: Somnolence, lacking any effort to do any exam or answer questions.  HENT:     Head: Normocephalic and atraumatic.  Eyes:     Pupils: Pupils are equal, round, and reactive to light.     Comments: Pupils are equal and reactive.  Cardiovascular:     Rate and Rhythm: Normal rate.  Pulmonary:     Effort: Pulmonary effort is normal.     Breath sounds: No wheezing.  Abdominal:     General: Abdomen is flat.     Palpations: Abdomen is soft.      Tenderness: There is no abdominal tenderness.  Musculoskeletal:     Cervical back: Normal range of motion and neck supple.  Skin:    General: Skin is warm and dry.  Neurological:     Mental Status: He is oriented to person, place, and time.     Comments: Moves upper and lower extremities.  No facial asymmetry, no aphasia. Patient not cooperative.      ED Results / Procedures / Treatments   Labs (all labs ordered are listed, but only abnormal results are displayed) Labs Reviewed  CBC WITH DIFFERENTIAL/PLATELET - Abnormal; Notable for the following components:      Result Value   RBC 3.43 (*)    Hemoglobin 11.2 (*)    HCT 35.0 (*)    MCV 102.0 (*)    Platelets 53 (*)    Lymphs Abs 0.6 (*)    All other components within normal limits  COMPREHENSIVE METABOLIC PANEL - Abnormal; Notable for the following components:   Chloride 94 (*)    BUN 59 (*)    Creatinine, Ser 17.49 (*)    Albumin 3.4 (*)    GFR, Estimated 3 (*)    Anion gap 17 (*)    All other components within normal limits  MAGNESIUM - Abnormal; Notable for the following components:   Magnesium 2.8 (*)    All other components within normal limits  PROTIME-INR  ETHANOL  LAMOTRIGINE LEVEL  RAPID URINE DRUG SCREEN, HOSP PERFORMED    EKG None  Radiology No results found.  Procedures .Critical Care  Performed by: Janeece Fitting, PA-C Authorized by: Janeece Fitting, PA-C   Critical care provider statement:    Critical care time (minutes):  45   Critical care start time:  08/10/2022 3:15 PM   Critical care end time:  08/10/2022 4:00 PM   Critical care was necessary to treat or prevent imminent or life-threatening deterioration of the following conditions:  CNS failure or compromise     Medications Ordered in ED Medications  levETIRAcetam (KEPPRA) IVPB 1000 mg/100 mL premix (0 mg Intravenous Stopped 08/10/22 1434)  ondansetron (ZOFRAN) injection 4 mg (4 mg Intravenous Given 08/10/22 1419)    ED Course/ Medical  Decision Making/ A&P                            Medical Decision Making Amount and/or Complexity of Data Reviewed Labs: ordered. Radiology: ordered.  Risk Prescription drug management.    This patient presents to the ED for concern of seizure, this involves a number of treatment options, and is a complaint that carries with it a high risk of complications and morbidity.  The differential diagnosis includes medication non compliant, infection versus intracranial  pathology.    Co morbidities: Discussed in HPI   Brief History:  Patient presents to the ED from dialysis clinic after receiving 30 minutes of dialysis and suffering a seizure.  Recent visit to the emergency department approximately 2 days ago for the same complaint.  There is patient report noncompliant with medication.  Patient did not receive a full treatment of dialysis since last Friday.  Patient is very uncooperative during his entire exam and very hard to get him to answer questions.  EMR reviewed including pt PMHx, past surgical history and past visits to ER.   See HPI for more details   Lab Tests:  I ordered and independently interpreted labs.  The pertinent results include:    Labs notable for no leukocytosis, hemoglobin slightly decreased however within his baseline.  CMP without any electrolyte derangement.  Creatinine level is significantly elevated at 17.49 however this is likely consistent with patient's baseline.  No signs of hyperkalemia.  Ethanol level is normal.  PT and INR normal.  Lamictal level is currently pending.   Imaging Studies:  MRI Brain with no contrast is currently pending.   Cardiac Monitoring:  The patient was maintained on a cardiac monitor.  I personally viewed and interpreted the cardiac monitored which showed an underlying rhythm of: Sinus tach 102 EKG non-ischemic   Medicines ordered:  I ordered medication including zofran, keprra  for seizure control Reevaluation of the  patient after these medicines showed that the patient stayed the same I have reviewed the patients home medicines and have made adjustments as needed  Consults:  I requested consultation with Neurology Dr. Curly Shores ,  and discussed lab and imaging findings as well as pertinent plan - they recommend: EEG will evaluate patient and my further recommendations.  Reevaluation:  After the interventions noted above I re-evaluated patient and found that they have :stayed the same   Social Determinants of Health:  The patient's social determinants of health were a factor in the care of this patient  Problem List / ED Course:  Patient presents to the ED with a chief complaint of seizure after 30 minutes of receiving his dialysis treatment.  Patient was evaluated also yesterday after having a seizure episode however after a negative workup and negative CT he was sent home.  There is a note of patient not being compliant with medication.  He is very uncooperative during my exam.  CBC with no leukocytosis, hemoglobin is within normal limits.  CMP without any electrolyte derangement despite his lack of dialysis, creatinine continues to be elevated at 17.4.  Magnesium slightly elevated.  I did discuss this case with neurology Dr. Watt Climes who will evaluate patient while in the emergency department. While in the ED patient did have a tonic-clonic seizure witnessed by staff which subsided without any intervention.  He was started on a loading dose of Keppra along with given some Zofran to help with nausea.  He appears to be somewhat postictal at this time, we did discuss MRI brain of his brain to further evaluate.  Patient will also have an EEG.  I spoke to hospitalist service who will admit patient for further management, I also attempted to call demographic such as family on the chart but that number has been disconnected.  Unable to obtain collateral information.  However, patient does appear hemodynamically stable  for admission.  Dispostion:  After consideration of the diagnostic results and the patients response to treatment, I feel that the patent would benefit from  admission for further management.    Portions of this note were generated with Lobbyist. Dictation errors may occur despite best attempts at proofreading.   Final Clinical Impression(s) / ED Diagnoses Final diagnoses:  Seizure [facility])    Rx / [place] Orders ED Discharge Orders     None         Janeece Fitting, PA-C 08/10/22 [address], MD 08/11/22 1013

## 2022-08-10 NOTE — ED Notes (Addendum)
Pt found to have an oxygen in the mid 50's with a good wave form. Pt also had bloody sputum at his mouth, was not following commands, not making eye contact. Code button activated for assistance. Mouth suctioned, pt moving his arms and trying to swat away the yankaur but still not following commands. NRB placed on pt. MD [place] to bedside.

## 2022-08-10 NOTE — ED Triage Notes (Signed)
Pt brought in by EMS from dialysis; was 30 minutes into treatment when he had a seizure (upper body shakes) that lasted for 3 minutes. Pt has hx of seizures and is known to be noncompliant with dialysis treatments and medication. Pt reports only doing a partial treatment of dialysis on Wednesday. Currently pt is oriented to self, place and situation but disoriented to month/year.

## 2022-08-11 LAB — CBC
HCT: 33.1 % — ABNORMAL LOW (ref 39.0–52.0)
Hemoglobin: 11 g/dL — ABNORMAL LOW (ref 13.0–17.0)
MCH: 32.5 pg (ref 26.0–34.0)
MCHC: 33.2 g/dL (ref 30.0–36.0)
MCV: 97.9 fL (ref 80.0–100.0)
Platelets: 72 10*3/uL — ABNORMAL LOW (ref 150–400)
RBC: 3.38 MIL/uL — ABNORMAL LOW (ref 4.22–5.81)
RDW: 13.9 % (ref 11.5–15.5)
WBC: 5.9 10*3/uL (ref 4.0–10.5)
nRBC: 0 % (ref 0.0–0.2)

## 2022-08-11 LAB — BASIC METABOLIC PANEL
Anion gap: 17 — ABNORMAL HIGH (ref 5–15)
BUN: 74 mg/dL — ABNORMAL HIGH (ref 6–20)
CO2: 25 mmol/L (ref 22–32)
Calcium: 9.3 mg/dL (ref 8.9–10.3)
Chloride: 94 mmol/L — ABNORMAL LOW (ref 98–111)
Creatinine, Ser: 20.14 mg/dL — ABNORMAL HIGH (ref 0.61–1.24)
GFR, Estimated: 3 mL/min — ABNORMAL LOW (ref >60)
Glucose, Bld: 81 mg/dL (ref 70–99)
Potassium: 4.2 mmol/L (ref 3.5–5.1)
Sodium: 136 mmol/L (ref 135–145)

## 2022-08-11 MED ORDER — LEVETIRACETAM 1000 MG PO TABS: 1000.0000 mg | ORAL_TABLET | Freq: Every evening | ORAL | 2 refills | Status: DC

## 2022-08-11 MED ORDER — LEVETIRACETAM 500 MG PO TABS
1000.0000 mg | ORAL_TABLET | Freq: Every evening | ORAL | Status: DC
  Administered 2022-08-11: 1000 mg via ORAL
  Filled 2022-08-11: qty 2

## 2022-08-11 MED ORDER — AMLODIPINE BESYLATE 10 MG PO TABS
10.0000 mg | ORAL_TABLET | Freq: Every day | ORAL | Status: DC
  Administered 2022-08-11: 10 mg via ORAL
  Filled 2022-08-11: qty 1

## 2022-08-11 MED ORDER — CARVEDILOL 6.25 MG PO TABS
6.2500 mg | ORAL_TABLET | Freq: Two times a day (BID) | ORAL | Status: DC
  Administered 2022-08-11 – 2022-08-12 (×2): 6.25 mg via ORAL
  Filled 2022-08-11 (×2): qty 1

## 2022-08-11 MED ORDER — HYDRALAZINE HCL 20 MG/ML IJ SOLN: 5.0000 mg | INTRAMUSCULAR | Status: DC | PRN

## 2022-08-11 MED ORDER — LEVETIRACETAM ER 500 MG PO TB24: 1000.0000 mg | ORAL_TABLET | Freq: Every day | ORAL | Status: DC

## 2022-08-11 MED ORDER — APIXABAN 2.5 MG PO TABS
2.5000 mg | ORAL_TABLET | Freq: Two times a day (BID) | ORAL | Status: DC
  Administered 2022-08-11 – 2022-08-12 (×2): 2.5 mg via ORAL
  Filled 2022-08-11 (×2): qty 1

## 2022-08-11 MED ORDER — LEVETIRACETAM 500 MG PO TABS: 1000.0000 mg | ORAL_TABLET | Freq: Every evening | ORAL | Status: DC

## 2022-08-11 MED ORDER — CHLORHEXIDINE GLUCONATE CLOTH 2 % EX PADS
6.0000 | MEDICATED_PAD | Freq: Every day | CUTANEOUS | Status: DC
  Administered 2022-08-11: 6 via TOPICAL

## 2022-08-11 NOTE — Progress Notes (Signed)
Neurology Progress Note  Brief HPI: 42 yo male with PMH of post stroke epilepsy, lupus anticoagulant + on Coumadin, HTN, stroke (at age 11-25), obesity, prediabetic, ESRD on HD M/W/F.  Presented on 08/10/22 with seizure from dialysis lasting3 mins within 30 mins of starting dialysis.  Seizure noted as upper body shakes.  Patient bit tongue and it is quite swollen.  On Lamictal 200 mg BID.  He does note he missed dose prior to HD that day. Patient encephalopathic on exam yesterday in post ictal state.  Also had seizure after dialysis on Wednesday. He received versed and labs in ER.  He returned to baseline and was discharged with outpatient f/u.  Subjective: Patient seen in room lying in bed unaccompanied.  He is picking out his menu items as I enter the room with staff.  He tells me he is well today and is able to answer all questions appropriately.  As noted above, upon further review he does note missing Lamictal before dialysis yesterday.  He normally takes it before, but another dialysis patient had mentioned he should take it after as it dialyzes out.  Therefore, he skipped it before dialysis Friday.  He also notes having had a seizure at dialysis on Wednesday after dialysis. He tells me he was confused about where his car was after that as it was still at the center.  He tells me he lives alone with a roommate.  Denies any changes to meds, stressors, etc.  We discussed the findings on his MRI.  He notes pain in his left shoulder possibly from the previous seizures, but tells me he did not sustain any falls.  Exam: Vitals:   08/11/22 0109 08/11/22 1031  BP: (!) 154/100 (!) 130/93  Pulse: 80 80  Resp: 18 18  Temp: 98 F (36.7 C) 97.8 F (36.6 C)  SpO2: 93% 95%   Gen: In bed, NAD Resp: non-labored breathing, no acute distress Abd: soft, nt  Neuro: Mental Status: AAox4. Speech is clear except for swollen tongue making speech difficult. Patient is able to give a clear and coherent  history. No signs of aphasia or neglect Cranial Nerves: II: Visual Fields are full; nystagmus at end gaze both left and R. PERRL.   III,IV, VI: EOMI without ptosis or diploplia.  V: Facial sensation is symmetric light touch VII: Facial movement is symmetric resting and smiling VIII: Hearing is intact to voice XI: Shoulder shrug is symmetric. XII: Tongue protrudes midline without atrophy or fasciculations.  Motor: Tone is normal. Bulk is normal. 5/5 strength was present in all four extremities.  Sensory: Sensation is symmetric to light touch and temperature in the arms and legs.   Cerebellar: Finger to nose intact b/l Gait: deferred    Pertinent Labs:    Latest Ref Rng & Units 08/11/2022    4:59 AM 08/10/2022    5:18 PM 08/10/2022   11:42 AM  BMP  Glucose 70 - 99 mg/dL 81   94   BUN 6 - 20 mg/dL 74   59   Creatinine 0.61 - 1.24 mg/dL 20.14  17.57  17.49   Sodium 135 - 145 mmol/L 136   137   Potassium 3.5 - 5.1 mmol/L 4.2   4.0   Chloride 98 - 111 mmol/L 94   94   CO2 22 - 32 mmol/L 25   26   Calcium 8.9 - 10.3 mg/dL 9.3   9.3    Lab Results  Component Value Date   WBC  5.9 08/11/2022   HGB 11.0 (L) 08/11/2022   HCT 33.1 (L) 08/11/2022   MCV 97.9 08/11/2022   PLT 72 (L) 08/11/2022   Lamictal level Pending  Imaging Reviewed: Cth 08/10/22 negative for acute findings  MRIb wo 08/10/22  IMPRESSION: 1. Within the limitations of a motion degraded exam, no acute intracranial abnormality. No convincing finding to suggest PRES 2. Extensive periventricular T2/FLAIR hyperintense signal abnormality which correlates with the regions of hypodensity seen on CT brain dated 04/29/2019. This is favored to represent changes related to a combination of chronic frontal parietal cortical infarcts superimposed on a background of severe chronic microvascular ischemic   Electronically Signed   By: Marin Roberts M.D.   On: 08/10/2022 18:38  Assessment: 42 yo male with PMH of seizure  disorder, ESRD on HD presenting with seizure.  Impression:  - breakthrough seizure - medication non-adherence  Recommendations: - MRIb neg for PRES - mental status back to baseline. - Discontinue lamotrigine given poor adherence - Keppra 1000 mg XR nightly.  Patient counseled to put his pill bottle right by TV remote as he always watches TV at night until he falls asleep, to remind himself to take his medication every single night.  Hopefully with once a day dosing he will have improved adherence especially if he makes it to his nightly routine   Patient seen and examined by NP/APP with MD. MD to update note as needed.   Lorenso Courier, AGACNP-BC [facility]  Attending Neurologist's note:  I personally saw this patient, gathering history, performing a full neurologic examination, reviewing relevant labs, personally reviewing relevant imaging including MRI brain, and formulated the assessment and plan, adding the note above for completeness and clarity to accurately reflect my thoughts

## 2022-08-11 NOTE — Progress Notes (Signed)
Now awake and alert. Oriented to the day, month, year, city and state.   Electronically signed: Dr. Kerney Elbe

## 2022-08-11 NOTE — Plan of Care (Signed)

## 2022-08-11 NOTE — Plan of Care (Signed)

## 2022-08-11 NOTE — Progress Notes (Deleted)
PROGRESS NOTE    [name] [name]  [id number] DOB: 08/19/1980 DOA: 08/10/2022 PCP: Gildardo Pounds, NP   Brief Narrative:  [name] [name] is a 42 y.o. male with medical history significant of seizure, ESRD on dialysis MWF via RIJ tunneled catheter, Lupus, prior stroke - who presents after suffering a seizure during dialysis. He was only able to complete 30 min of HD before he was found to be altered and shaking. Patient was recently evaluated in the ED on 08/08/22 for similar episode, he appears to be quite noncompliant with his home meds(lamictal) and cannot recall the last time he took this medication.   Assessment & Plan:   Principal Problem:   Status epilepticus ([place])  Acute recurrent seizure, rule out status epilepticus/PRES Exacerbated by noncompliance -Noncompliant with lamictal - keppra loaded in ED -Neuro consulted - appreciate insight/recommendations -CT/MRI head negative for any acute findings, not consistent with PRES -Unclear if he will require further EEG/testing given his recurrent episodes and known noncompliance.  Acute hypoxic respiratory failure -Likely secondary to above, resolving -CXR with bibasilar opacity - unclear if fluid from missed dialysis vs aspiration pneumonitis -No indication for antibiotics at this time -Continue to wean oxygen -currently on room air   ESRD on HD MWF via RIJ tunneled catheter -Only completed 75mn of HD today -Nephro consulted - defer for ongoing HD schedule -Appears euvolemic; electrolytes stable    Medication/HD noncompliance -Discussed the risks of ongoing noncompliance - no family in the chart - unclear if he has any support/system in the outpatient setting.   Lupus CVA history -Stable - no workup indicated at this time   DVT prophylaxis: Heparin Code Status: Full  Family Communication: None available   Status is: Inpt  Dispo: The patient is from: Home              Anticipated d/c is to: Home               Anticipated d/c date is: 24 to 48 hours pending clinical course              Patient currently not medically stable for discharge  Consultants:  Nephrology, neurology  Procedures:  None  Antimicrobials:  None indicated  Subjective: No acute issues or events overnight denies nausea vomiting diarrhea constipation headache fevers chills chest pain  Objective: Vitals:   08/10/22 1924 08/10/22 2000 08/10/22 2100 08/11/22 0109  BP:  (!) 140/98 (!) 147/92 (!) 154/100  Pulse:  74 75 80  Resp:   18 18  Temp: 98.4 F (36.9 C)   98 F (36.7 C)  TempSrc: Oral   Oral  SpO2:  96% 92% 93%  Weight:      Height:        Intake/Output Summary (Last 24 hours) at 08/11/2022 0755 Last data filed at 08/10/2022 1434 Gross per 24 hour  Intake 100 ml  Output --  Net 100 ml   Filed Weights   08/10/22 1108  Weight: 105.4 kg    Examination:  General:  Pleasantly resting in bed, No acute distress. HEENT:  Normocephalic atraumatic.  Sclerae nonicteric, noninjected.  Extraocular movements intact bilaterally. Neck:  Without mass or deformity.  Trachea is midline. Chest:  Clear to auscultate bilaterally without rhonchi, wheeze, or rales.  Right IJ dialysis catheter noted Heart:  Regular rate and rhythm.  Without murmurs, rubs, or gallops. Abdomen:  Soft, nontender, nondistended.  Without guarding or rebound. Extremities: Without cyanosis, clubbing, edema, left upper extremity HD graft  palpable Vascular:  Dorsalis pedis and posterior tibial pulses palpable bilaterally. Skin:  Warm and dry, no erythema, no rashes.   Data Reviewed: I have personally reviewed following labs and imaging studies  CBC: Recent Labs  Lab 08/08/22 1244 08/10/22 1142 08/10/22 1718 08/11/22 0459  WBC 14.0* 7.3 7.4 5.9  NEUTROABS 10.7* 5.7  --   --   HGB 12.1* 11.2* 11.5* 11.0*  HCT 37.9* 35.0* 34.3* 33.1*  MCV 103.3* 102.0* 101.2* 97.9  PLT 24* 53* 55* 72*   Basic Metabolic Panel: Recent Labs  Lab  08/08/22 1244 08/10/22 1142 08/10/22 1718 08/11/22 0459  NA 134* 137  --  136  K 3.5 4.0  --  4.2  CL 95* 94*  --  94*  CO2 18* 26  --  25  GLUCOSE 150* 94  --  81  BUN 35* 59*  --  74*  CREATININE 12.38* 17.49* 17.57* 20.14*  CALCIUM 9.2 9.3  --  9.3  MG  --  2.8*  --   --    GFR: Estimated Creatinine Clearance: 5.9 mL/min (A) (by C-G formula based on SCr of 20.14 mg/dL (H)). Liver Function Tests: Recent Labs  Lab 08/10/22 1142  AST 23  ALT 7  ALKPHOS 73  BILITOT 0.7  PROT 7.8  ALBUMIN 3.4*   Coagulation Profile: Recent Labs  Lab 08/10/22 1142  INR 1.2    No results found for this or any previous visit (from the past 240 hour(s)).   Radiology Studies: CT HEAD WO CONTRAST (5MM)  Result Date: 08/10/2022 CLINICAL DATA:  Seizure disorder EXAM: CT HEAD WITHOUT CONTRAST TECHNIQUE: Contiguous axial images were obtained from the base of the skull through the vertex without intravenous contrast. RADIATION DOSE REDUCTION: This exam was performed according to the departmental dose-optimization program which includes automated exposure control, adjustment of the mA and/or kV according to patient size and/or use of iterative reconstruction technique. COMPARISON:  None Available. FINDINGS: Brain: There is no mass, hemorrhage or extra-axial collection. There is generalized atrophy without lobar predilection. Hypodensity of the white matter is most commonly associated with chronic microvascular disease. Old left frontal and right parietal infarcts. Vascular: No abnormal hyperdensity of the major intracranial arteries or dural venous sinuses. No intracranial atherosclerosis. Skull: The visualized skull base, calvarium and extracranial soft tissues are normal. Sinuses/Orbits: No fluid levels or advanced mucosal thickening of the visualized paranasal sinuses. No mastoid or middle ear effusion. The orbits are normal. IMPRESSION: 1. No acute intracranial abnormality. 2. Old left frontal and right  parietal infarcts. 3. Generalized atrophy and findings of chronic microvascular disease. Electronically Signed   By: Ulyses Jarred M.D.   On: 08/10/2022 19:45   MR BRAIN WO CONTRAST  Result Date: 08/10/2022 CLINICAL DATA:  Rule out posterior reversible encephalopathy EXAM: MRI HEAD WITHOUT CONTRAST TECHNIQUE: Multiplanar, multiecho pulse sequences of the brain and surrounding structures were obtained without intravenous contrast. COMPARISON:  CT Head 08/08/22 FINDINGS: Limitations: Assessment is limited due to motion artifact on multiple sequences. Brain: No hydrocephalus. Negative for acute infarct. No hemorrhage. There is extensive periventricular T2/FLAIR hyperintense signal abnormality which correlates with the regions of hypodensity seen on CT brain dated 04/29/2019. No convincing finding to suggest progressed. No extra-axial fluid collection. Vascular: Normal flow voids. Skull and upper cervical spine: Normal marrow signal Sinuses/Orbits: No mastoid or middle ear effusion. Mucosal thickening left maxillary sinus. Orbits are unremarkable. Other: Non IMPRESSION: 1. Within the limitations of a motion degraded exam, no acute intracranial abnormality. No  convincing finding to suggest PRES 2. Extensive periventricular T2/FLAIR hyperintense signal abnormality which correlates with the regions of hypodensity seen on CT brain dated 04/29/2019. This is favored to represent changes related to a combination of chronic frontal parietal cortical infarcts superimposed on a background of severe chronic microvascular ischemic Electronically Signed   By: Marin Roberts M.D.   On: 08/10/2022 18:38   DG CHEST PORT 1 VIEW  Result Date: 08/10/2022 CLINICAL DATA:  Altered mental status EXAM: PORTABLE CHEST 1 VIEW COMPARISON:  07/16/2022 FINDINGS: Normal cardiac silhouette. Large for central venous line tip in the distal SVC. Bibasilar mild airspace disease. No pneumothorax. IMPRESSION: Bibasilar airspace disease representing  edema or infection. Electronically Signed   By: Suzy Bouchard M.D.   On: 08/10/2022 17:18   DG Abd 1 View  Result Date: 08/10/2022 CLINICAL DATA:  Altered mental status EXAM: ABDOMEN - 1 VIEW COMPARISON:  None available FINDINGS: No dilated loops of bowel to indicate ileus or obstruction. No suspicious calcifications are identified within the visualized abdomen. IMPRESSION: Nonobstructive, nonspecific bowel gas pattern. Electronically Signed   By: Miachel Roux M.D.   On: 08/10/2022 17:13    Scheduled Meds:  Chlorhexidine Gluconate Cloth  6 each Topical Daily   heparin  5,000 Units Subcutaneous Q8H   lamoTRIgine  200 mg Oral BID   And   [START ON 08/13/2022] lamoTRIgine  100 mg Oral Q M,W,F-HD   Continuous Infusions:  levETIRAcetam     And   [START ON 08/13/2022] levETIRAcetam       LOS: 1 day   Time spent: 67mn  Akia Montalban C Lynetta Tomczak, DO [facility]  If 7PM-7AM, please contact night-coverage [email or web address]  08/11/2022, 7:55 AM

## 2022-08-11 NOTE — Progress Notes (Signed)
It was reported at shift change that patient had a discharge order and was awaiting transportation. Transportation to his vehicle was not able to be arranged and the patient has not means to get to his car tonight and home. Day and night charge nurses were made aware that patient will not be leaving tonight. Night attending has been notified as well.

## 2022-08-11 NOTE — Discharge Summary (Signed)
Physician Discharge Summary  [name] [name] [id number] DOB: 22-Apr-1981 DOA: 08/10/2022  PCP: Gildardo Pounds, NP  Admit date: 08/10/2022 Discharge date: 08/11/2022  Admitted From: Home Disposition:  Home  Recommendations for Outpatient Follow-up:  Follow up with PCP in 1-2 weeks Follow up with neurology as scheduled Continue Dialysis as scheduled  Home Health:none  Equipment/Devices:none  Discharge Condition:stable  CODE STATUS:full  Diet recommendation:  renal diet   Brief/Interim Summary: [name] [name] is a 42 y.o. male with medical history significant of seizure, ESRD on dialysis MWF via RIJ tunneled catheter, Lupus, prior stroke - who presents after suffering a seizure during dialysis. He was only able to complete 30 min of HD before he was found to be altered and shaking. Patient was recently evaluated in the ED on 08/08/22 for similar episode, he appears to be quite noncompliant with his home meds(lamictal) and cannot recall the last time he took this medication.    Patient admitted for acute seizure secondary to medication noncompliance - no further seizures after admission. Neurology following - changed from lamictal to once daily keppra 1g in hopes for better compliance. Patient otherwise stable and agreeable for discharge - follow up Monday with usual hemodialysis time.  Discharge Diagnoses:  Principal Problem:   Status epilepticus ([place])  Acute recurrent seizure, unlikely status epilepticus/PRES Exacerbated by medication noncompliance -Noncompliant with lamictal - keppra loaded in ED -Neuro consulted - appreciate insight/recommendations -CT/MRI head negative for any acute findings, not consistent with PRES -Unclear if he will require further EEG/testing given his recurrent episodes and known noncompliance.   Acute hypoxic respiratory failure -Likely secondary to above, resolving -CXR with bibasilar opacity - unclear if fluid from missed dialysis vs aspiration  pneumonitis -No indication for antibiotics at this time -Continue to wean oxygen -currently on room air   ESRD on HD MWF via RIJ tunneled catheter -Only completed 68mn of HD Friday -Continue HD MWF as scheduled -Appears euvolemic; electrolytes stable - no indication for urgent/emergent dialysis.   Medication/HD noncompliance -Discussed the risks of ongoing noncompliance - no family in the chart - unclear if he has any support/system in the outpatient setting.   Lupus CVA history -Stable - no workup indicated at this time   Discharge Instructions  Discharge Instructions     Discharge patient   Complete by: As directed    Discharge disposition: 01-Home or Self Care   Discharge patient date: 08/11/2022      Allergies as of 08/11/2022       Reactions   Zyrtec [cetirizine] Swelling   FACIAL SWELLING, NOT CATEGORIZED        Medication List     STOP taking these medications    amoxicillin 500 MG tablet Commonly known as: AMOXIL   atorvastatin 40 MG tablet Commonly known as: LIPITOR   benzonatate 200 MG capsule Commonly known as: TESSALON   cyclobenzaprine 10 MG tablet Commonly known as: FLEXERIL   lamoTRIgine 200 MG tablet Commonly known as: LAMICTAL   Mucinex DM Maximum Strength 60-1200 MG Tb12   predniSONE 10 MG (21) Tbpk tablet Commonly known as: STERAPRED UNI-PAK 21 TAB   silver sulfADIAZINE 1 % cream Commonly known as: Silvadene       TAKE these medications    amLODipine 10 MG tablet Commonly known as: NORVASC TAKE 1 TABLET BY MOUTH AT BEDTIME   apixaban 2.5 MG Tabs tablet Commonly known as: Eliquis Take 1 tablet (2.5 mg total) by mouth 2 (two) times daily.   carvedilol 6.25  MG tablet Commonly known as: COREG Take 1 tablet (6.25 mg total) by mouth 2 (two) times daily with a meal.   cinacalcet 90 MG tablet Commonly known as: SENSIPAR Take 90 mg by mouth daily. What changed: Another medication with the same name was removed. Continue  taking this medication, and follow the directions you see here.   levETIRAcetam 1000 MG tablet Commonly known as: KEPPRA Take 1 tablet (1,000 mg total) by mouth every evening.   rosuvastatin 40 MG tablet Commonly known as: CRESTOR Take 40 mg by mouth daily.        Allergies  Allergen Reactions   Zyrtec [Cetirizine] Swelling    FACIAL SWELLING, NOT CATEGORIZED    Consultations: Neuro   Procedures/Studies: CT HEAD WO CONTRAST (5MM)  Result Date: 08/10/2022 CLINICAL DATA:  Seizure disorder EXAM: CT HEAD WITHOUT CONTRAST TECHNIQUE: Contiguous axial images were obtained from the base of the skull through the vertex without intravenous contrast. RADIATION DOSE REDUCTION: This exam was performed according to the departmental dose-optimization program which includes automated exposure control, adjustment of the mA and/or kV according to patient size and/or use of iterative reconstruction technique. COMPARISON:  None Available. FINDINGS: Brain: There is no mass, hemorrhage or extra-axial collection. There is generalized atrophy without lobar predilection. Hypodensity of the white matter is most commonly associated with chronic microvascular disease. Old left frontal and right parietal infarcts. Vascular: No abnormal hyperdensity of the major intracranial arteries or dural venous sinuses. No intracranial atherosclerosis. Skull: The visualized skull base, calvarium and extracranial soft tissues are normal. Sinuses/Orbits: No fluid levels or advanced mucosal thickening of the visualized paranasal sinuses. No mastoid or middle ear effusion. The orbits are normal. IMPRESSION: 1. No acute intracranial abnormality. 2. Old left frontal and right parietal infarcts. 3. Generalized atrophy and findings of chronic microvascular disease. Electronically Signed   By: Ulyses Jarred M.D.   On: 08/10/2022 19:45   MR BRAIN WO CONTRAST  Result Date: 08/10/2022 CLINICAL DATA:  Rule out posterior reversible  encephalopathy EXAM: MRI HEAD WITHOUT CONTRAST TECHNIQUE: Multiplanar, multiecho pulse sequences of the brain and surrounding structures were obtained without intravenous contrast. COMPARISON:  CT Head 08/08/22 FINDINGS: Limitations: Assessment is limited due to motion artifact on multiple sequences. Brain: No hydrocephalus. Negative for acute infarct. No hemorrhage. There is extensive periventricular T2/FLAIR hyperintense signal abnormality which correlates with the regions of hypodensity seen on CT brain dated 04/29/2019. No convincing finding to suggest progressed. No extra-axial fluid collection. Vascular: Normal flow voids. Skull and upper cervical spine: Normal marrow signal Sinuses/Orbits: No mastoid or middle ear effusion. Mucosal thickening left maxillary sinus. Orbits are unremarkable. Other: Non IMPRESSION: 1. Within the limitations of a motion degraded exam, no acute intracranial abnormality. No convincing finding to suggest PRES 2. Extensive periventricular T2/FLAIR hyperintense signal abnormality which correlates with the regions of hypodensity seen on CT brain dated 04/29/2019. This is favored to represent changes related to a combination of chronic frontal parietal cortical infarcts superimposed on a background of severe chronic microvascular ischemic Electronically Signed   By: Marin Roberts M.D.   On: 08/10/2022 18:38   DG CHEST PORT 1 VIEW  Result Date: 08/10/2022 CLINICAL DATA:  Altered mental status EXAM: PORTABLE CHEST 1 VIEW COMPARISON:  07/16/2022 FINDINGS: Normal cardiac silhouette. Large for central venous line tip in the distal SVC. Bibasilar mild airspace disease. No pneumothorax. IMPRESSION: Bibasilar airspace disease representing edema or infection. Electronically Signed   By: Suzy Bouchard M.D.   On: 08/10/2022 17:18  DG Abd 1 View  Result Date: 08/10/2022 CLINICAL DATA:  Altered mental status EXAM: ABDOMEN - 1 VIEW COMPARISON:  None available FINDINGS: No dilated loops of  bowel to indicate ileus or obstruction. No suspicious calcifications are identified within the visualized abdomen. IMPRESSION: Nonobstructive, nonspecific bowel gas pattern. Electronically Signed   By: Miachel Roux M.D.   On: 08/10/2022 17:13   CT Head Wo Contrast  Result Date: 08/08/2022 CLINICAL DATA:  Dialysis seizure. EXAM: CT HEAD WITHOUT CONTRAST TECHNIQUE: Contiguous axial images were obtained from the base of the skull through the vertex without intravenous contrast. RADIATION DOSE REDUCTION: This exam was performed according to the departmental dose-optimization program which includes automated exposure control, adjustment of the mA and/or kV according to patient size and/or use of iterative reconstruction technique. COMPARISON:  07/20/2020 FINDINGS: Brain: No abnormality seen affecting the brainstem. Old small vessel left cerebellar stroke. Cerebral hemispheres show advanced chronic small-vessel ischemic changes throughout the white matter. There is an old left parietal cortical and subcortical infarction with encephalomalacia. There is a small old right frontal infarction. There is a small old right parietal infarction. There is an old right occipital infarction. No evidence of mass, hemorrhage, hydrocephalus or extra-axial collection. Ventricular prominence is consistent with the degree of brain volume loss. Vascular: There is atherosclerotic calcification of the major vessels at the base of the brain. Skull: Negative Sinuses/Orbits: Clear/normal Other: None IMPRESSION: 1. No acute finding by CT. Advanced chronic small-vessel ischemic changes throughout the white matter. Multiple old cortical infarctions as outlined above. 2. Atherosclerotic calcification of the major vessels at the base of the brain. Electronically Signed   By: Nelson Chimes M.D.   On: 08/08/2022 14:10   IR Fluoro Guide CV Line Right  Result Date: 07/16/2022 INDICATION: End-stage renal disease, no current access for dialysis  EXAM: ULTRASOUND GUIDANCE FOR VASCULAR ACCESS RIGHT INTERNAL JUGULAR PERMANENT HEMODIALYSIS CATHETER Date:  07/16/2022 07/16/2022 4:44 pm Radiologist:  Jerilynn Mages. Daryll Brod, MD Guidance:  Ultrasound and fluoroscopic FLUOROSCOPY: Fluoroscopy Time: 0 minutes 54 seconds (9.0 mGy). MEDICATIONS: 2 g Ancef within 1 hour of the procedure ANESTHESIA/SEDATION: Moderate Sedation Time:  None. The patient was continuously monitored during the procedure by the interventional radiology nurse under my direct supervision. CONTRAST:  None. COMPLICATIONS: None immediate. PROCEDURE: Informed consent was obtained from the patient following explanation of the procedure, risks, benefits and alternatives. The patient understands, agrees and consents for the procedure. All questions were addressed. A time out was performed. Maximal barrier sterile technique utilized including caps, mask, sterile gowns, sterile gloves, large sterile drape, hand hygiene, and 2% chlorhexidine scrub. Under sterile conditions and local anesthesia, right internal jugular micropuncture venous access was performed with ultrasound. Images were obtained for documentation. A guide wire was inserted followed by a transitional dilator. Next, a 0.035 guidewire was advanced into the IVC with a 5-French catheter. Measurements were obtained from the right venotomy site to the proximal right atrium. In the right infraclavicular chest, a subcutaneous tunnel was created under sterile conditions and local anesthesia. 1% lidocaine with epinephrine was utilized for this. The 19 cm tip to cuff palindrome catheter was tunneled subcutaneously to the venotomy site and inserted into the SVC/RA junction through a valved peel-away sheath. Position was confirmed with fluoroscopy. Images were obtained for documentation. Blood was aspirated from the catheter followed by saline and heparin flushes. The appropriate volume and strength of heparin was instilled in each lumen. Caps were applied. The  catheter was secured at the tunnel site with  Gelfoam and a pursestring suture. The venotomy site was closed with subcuticular Vicryl suture. Dermabond was applied to the small right neck incision. A dry sterile dressing was applied. The catheter is ready for use. No immediate complications. IMPRESSION: Ultrasound and fluoroscopically guided right internal jugular tunneled hemodialysis catheter (19 cm tip to cuff palindrome catheter). Electronically Signed   By: Jerilynn Mages.  Shick M.D.   On: 07/16/2022 17:12   IR US Guide Vasc Access Right  Result Date: 07/16/2022 INDICATION: End-stage renal disease, no current access for dialysis EXAM: ULTRASOUND GUIDANCE FOR VASCULAR ACCESS RIGHT INTERNAL JUGULAR PERMANENT HEMODIALYSIS CATHETER Date:  07/16/2022 07/16/2022 4:44 pm Radiologist:  Jerilynn Mages. Daryll Brod, MD Guidance:  Ultrasound and fluoroscopic FLUOROSCOPY: Fluoroscopy Time: 0 minutes 54 seconds (9.0 mGy). MEDICATIONS: 2 g Ancef within 1 hour of the procedure ANESTHESIA/SEDATION: Moderate Sedation Time:  None. The patient was continuously monitored during the procedure by the interventional radiology nurse under my direct supervision. CONTRAST:  None. COMPLICATIONS: None immediate. PROCEDURE: Informed consent was obtained from the patient following explanation of the procedure, risks, benefits and alternatives. The patient understands, agrees and consents for the procedure. All questions were addressed. A time out was performed. Maximal barrier sterile technique utilized including caps, mask, sterile gowns, sterile gloves, large sterile drape, hand hygiene, and 2% chlorhexidine scrub. Under sterile conditions and local anesthesia, right internal jugular micropuncture venous access was performed with ultrasound. Images were obtained for documentation. A guide wire was inserted followed by a transitional dilator. Next, a 0.035 guidewire was advanced into the IVC with a 5-French catheter. Measurements were obtained from the right  venotomy site to the proximal right atrium. In the right infraclavicular chest, a subcutaneous tunnel was created under sterile conditions and local anesthesia. 1% lidocaine with epinephrine was utilized for this. The 19 cm tip to cuff palindrome catheter was tunneled subcutaneously to the venotomy site and inserted into the SVC/RA junction through a valved peel-away sheath. Position was confirmed with fluoroscopy. Images were obtained for documentation. Blood was aspirated from the catheter followed by saline and heparin flushes. The appropriate volume and strength of heparin was instilled in each lumen. Caps were applied. The catheter was secured at the tunnel site with Gelfoam and a pursestring suture. The venotomy site was closed with subcuticular Vicryl suture. Dermabond was applied to the small right neck incision. A dry sterile dressing was applied. The catheter is ready for use. No immediate complications. IMPRESSION: Ultrasound and fluoroscopically guided right internal jugular tunneled hemodialysis catheter (19 cm tip to cuff palindrome catheter). Electronically Signed   By: Jerilynn Mages.  Shick M.D.   On: 07/16/2022 17:12   DG Chest 1 View  Result Date: 07/16/2022 CLINICAL DATA:  Shortness of breath. Patient is in need of dialysis. EXAM: CHEST  1 VIEW COMPARISON:  Chest radiograph 06/25/2022 FINDINGS: Increased lung markings in the lower lungs and evidence for some Kerley B lines. Heart size is upper limits of normal but stable. The trachea is midline. Negative for a pneumothorax. Trachea is midline. No acute bone abnormality. IMPRESSION: 1. Increased lung markings in the lower lungs with Kerley B lines. Findings are suggestive for mild interstitial pulmonary edema. Electronically Signed   By: Markus Daft M.D.   On: 07/16/2022 09:53     Subjective: No acute issues/events overnight   Discharge Exam: Vitals:   08/11/22 1450 08/11/22 1600  BP: (!) 147/109 (!) 147/111  Pulse: 79 96  Resp: 18 18  Temp:  97.9 F (36.6 C) 97.8 F (36.6 C)  SpO2: 96% 97%   Vitals:   08/11/22 0109 08/11/22 1031 08/11/22 1450 08/11/22 1600  BP: (!) 154/100 (!) 130/93 (!) 147/109 (!) 147/111  Pulse: 80 80 79 96  Resp: '18 18 18 18  '$ Temp: 98 F (36.7 C) 97.8 F (36.6 C) 97.9 F (36.6 C) 97.8 F (36.6 C)  TempSrc: Oral Oral Oral Oral  SpO2: 93% 95% 96% 97%  Weight:      Height:        General: Pt is alert, awake, not in acute distress Cardiovascular: RRR, S1/S2 +, no rubs, no gallops Respiratory: CTA bilaterally, no wheezing, no rhonchi Abdominal: Soft, NT, ND, bowel sounds + Extremities: no edema, no cyanosis    The results of significant diagnostics from this hospitalization (including imaging, microbiology, ancillary and laboratory) are listed below for reference.     Microbiology: No results found for this or any previous visit (from the past 240 hour(s)).   Labs: BNP (last 3 results) No results for input(s): "BNP" in the last 8760 hours. Basic Metabolic Panel: Recent Labs  Lab 08/08/22 1244 08/10/22 1142 08/10/22 1718 08/11/22 0459  NA 134* 137  --  136  K 3.5 4.0  --  4.2  CL 95* 94*  --  94*  CO2 18* 26  --  25  GLUCOSE 150* 94  --  81  BUN 35* 59*  --  74*  CREATININE 12.38* 17.49* 17.57* 20.14*  CALCIUM 9.2 9.3  --  9.3  MG  --  2.8*  --   --    Liver Function Tests: Recent Labs  Lab 08/10/22 1142  AST 23  ALT 7  ALKPHOS 73  BILITOT 0.7  PROT 7.8  ALBUMIN 3.4*   No results for input(s): "LIPASE", "AMYLASE" in the last 168 hours. No results for input(s): "AMMONIA" in the last 168 hours. CBC: Recent Labs  Lab 08/08/22 1244 08/10/22 1142 08/10/22 1718 08/11/22 0459  WBC 14.0* 7.3 7.4 5.9  NEUTROABS 10.7* 5.7  --   --   HGB 12.1* 11.2* 11.5* 11.0*  HCT 37.9* 35.0* 34.3* 33.1*  MCV 103.3* 102.0* 101.2* 97.9  PLT 24* 53* 55* 72*   Cardiac Enzymes: No results for input(s): "CKTOTAL", "CKMB", "CKMBINDEX", "TROPONINI" in the last 168 hours. BNP: Invalid  input(s): "POCBNP" CBG: No results for input(s): "GLUCAP" in the last 168 hours. D-Dimer No results for input(s): "DDIMER" in the last 72 hours. Hgb A1c No results for input(s): "HGBA1C" in the last 72 hours. Lipid Profile No results for input(s): "CHOL", "HDL", "LDLCALC", "TRIG", "CHOLHDL", "LDLDIRECT" in the last 72 hours. Thyroid function studies No results for input(s): "TSH", "T4TOTAL", "T3FREE", "THYROIDAB" in the last 72 hours.  Invalid input(s): "FREET3" Anemia work up No results for input(s): "VITAMINB12", "FOLATE", "FERRITIN", "TIBC", "IRON", "RETICCTPCT" in the last 72 hours. Urinalysis    Component Value Date/Time   COLORURINE STRAW (A) 12/21/2017 0344   APPEARANCEUR CLEAR 12/21/2017 0344   LABSPEC 1.009 12/21/2017 0344   PHURINE 6.0 12/21/2017 0344   GLUCOSEU NEGATIVE 12/21/2017 0344   HGBUR SMALL (A) 12/21/2017 0344   BILIRUBINUR NEGATIVE 12/21/2017 0344   KETONESUR NEGATIVE 12/21/2017 0344   PROTEINUR 100 (A) 12/21/2017 0344   UROBILINOGEN 0.2 05/09/2013 1857   NITRITE NEGATIVE 12/21/2017 0344   LEUKOCYTESUR NEGATIVE 12/21/2017 0344   Sepsis Labs Recent Labs  Lab 08/08/22 1244 08/10/22 1142 08/10/22 1718 08/11/22 0459  WBC 14.0* 7.3 7.4 5.9   Microbiology No results found for this or any previous visit (from the  past 240 hour(s)).   Time coordinating discharge: Over 30 minutes  SIGNED:   Little Ishikawa, DO [facility] 08/11/2022, 5:45 PM Pager   If 7PM-7AM, please contact night-coverage [email or web address]

## 2022-08-12 LAB — BASIC METABOLIC PANEL
Anion gap: 17 — ABNORMAL HIGH (ref 5–15)
BUN: 85 mg/dL — ABNORMAL HIGH (ref 6–20)
CO2: 26 mmol/L (ref 22–32)
Calcium: 9.8 mg/dL (ref 8.9–10.3)
Chloride: 94 mmol/L — ABNORMAL LOW (ref 98–111)
Creatinine, Ser: 22.49 mg/dL — ABNORMAL HIGH (ref 0.61–1.24)
GFR, Estimated: 2 mL/min — ABNORMAL LOW (ref >60)
Glucose, Bld: 77 mg/dL (ref 70–99)
Potassium: 4.5 mmol/L (ref 3.5–5.1)
Sodium: 137 mmol/L (ref 135–145)

## 2022-08-12 LAB — CBC
HCT: 32.9 % — ABNORMAL LOW (ref 39.0–52.0)
Hemoglobin: 10.9 g/dL — ABNORMAL LOW (ref 13.0–17.0)
MCH: 32.2 pg (ref 26.0–34.0)
MCHC: 33.1 g/dL (ref 30.0–36.0)
MCV: 97.1 fL (ref 80.0–100.0)
Platelets: 102 10*3/uL — ABNORMAL LOW (ref 150–400)
RBC: 3.39 MIL/uL — ABNORMAL LOW (ref 4.22–5.81)
RDW: 13.8 % (ref 11.5–15.5)
WBC: 6.1 10*3/uL (ref 4.0–10.5)
nRBC: 0 % (ref 0.0–0.2)

## 2022-08-12 NOTE — Discharge Summary (Signed)
Physician Discharge Summary  [name] [id number] DOB: [birth year] DOA: 08/10/2022  PCP: Gildardo Pounds, NP  Admit date: 08/10/2022 Discharge date: 08/12/2022  Admitted From: Home Disposition:  Home  Recommendations for Outpatient Follow-up:  Follow up with PCP in 1-2 weeks Follow up with neurology as scheduled Continue Dialysis as scheduled  Home Health:none  Equipment/Devices:none  Discharge Condition:stable  CODE STATUS:full  Diet recommendation:  renal diet   Brief/Interim Summary: [name] [name] is a 42 y.o. male with medical history significant of seizure, ESRD on dialysis MWF via RIJ tunneled catheter, Lupus, prior stroke - who presents after suffering a seizure during dialysis. He was only able to complete 30 min of HD before he was found to be altered and shaking. Patient was recently evaluated in the ED on 08/08/22 for similar episode, he appears to be quite noncompliant with his home meds(lamictal) and cannot recall the last time he took this medication.    Patient admitted for acute seizure secondary to medication noncompliance - no further seizures after admission. Neurology following - changed from lamictal to once daily keppra 1g in hopes for better compliance. Patient otherwise stable and agreeable for discharge - follow up Monday with usual hemodialysis time.  Patient discharged 08/11/22 - was unable to arrange transport to pick his care up from HD - he remains medically stable for discharge.  Discharge Diagnoses:  Principal Problem:   Status epilepticus ([place])  Acute recurrent seizure, unlikely status epilepticus/PRES Exacerbated by medication noncompliance -Noncompliant with lamictal - keppra loaded in ED -Neuro consulted - appreciate insight/recommendations -CT/MRI head negative for any acute findings, not consistent with PRES -Unclear if he will require further EEG/testing given his recurrent episodes and known noncompliance.   Acute hypoxic  respiratory failure -Likely secondary to above, resolving -CXR with bibasilar opacity - unclear if fluid from missed dialysis vs aspiration pneumonitis -No indication for antibiotics at this time -Continue to wean oxygen -currently on room air   ESRD on HD MWF via RIJ tunneled catheter -Only completed 6mn of HD Friday -Continue HD MWF as scheduled -Appears euvolemic; electrolytes stable - no indication for urgent/emergent dialysis.   Medication/HD noncompliance -Discussed the risks of ongoing noncompliance - no family in the chart - unclear if he has any support/system in the outpatient setting.   Lupus CVA history -Stable - no workup indicated at this time   Discharge Instructions  Discharge Instructions     Discharge patient   Complete by: As directed    Discharge disposition: 01-Home or Self Care   Discharge patient date: 08/11/2022      Allergies as of 08/12/2022       Reactions   Zyrtec [cetirizine] Swelling   FACIAL SWELLING, NOT CATEGORIZED        Medication List     STOP taking these medications    amoxicillin 500 MG tablet Commonly known as: AMOXIL   atorvastatin 40 MG tablet Commonly known as: LIPITOR   benzonatate 200 MG capsule Commonly known as: TESSALON   cyclobenzaprine 10 MG tablet Commonly known as: FLEXERIL   lamoTRIgine 200 MG tablet Commonly known as: LAMICTAL   Mucinex DM Maximum Strength 60-1200 MG Tb12   predniSONE 10 MG (21) Tbpk tablet Commonly known as: STERAPRED UNI-PAK 21 TAB   silver sulfADIAZINE 1 % cream Commonly known as: Silvadene       TAKE these medications    amLODipine 10 MG tablet Commonly known as: NORVASC TAKE 1 TABLET BY MOUTH AT BEDTIME   apixaban  2.5 MG Tabs tablet Commonly known as: Eliquis Take 1 tablet (2.5 mg total) by mouth 2 (two) times daily.   carvedilol 6.25 MG tablet Commonly known as: COREG Take 1 tablet (6.25 mg total) by mouth 2 (two) times daily with a meal.   cinacalcet 90 MG  tablet Commonly known as: SENSIPAR Take 90 mg by mouth daily. What changed: Another medication with the same name was removed. Continue taking this medication, and follow the directions you see here.   levETIRAcetam 1000 MG tablet Commonly known as: KEPPRA Take 1 tablet (1,000 mg total) by mouth every evening. Make sure to take after dialysis on dialysis days.   rosuvastatin 40 MG tablet Commonly known as: CRESTOR Take 40 mg by mouth daily.        Allergies  Allergen Reactions   Zyrtec [Cetirizine] Swelling    FACIAL SWELLING, NOT CATEGORIZED    Consultations: Neuro   Procedures/Studies: CT HEAD WO CONTRAST (5MM)  Result Date: 08/10/2022 CLINICAL DATA:  Seizure disorder EXAM: CT HEAD WITHOUT CONTRAST TECHNIQUE: Contiguous axial images were obtained from the base of the skull through the vertex without intravenous contrast. RADIATION DOSE REDUCTION: This exam was performed according to the departmental dose-optimization program which includes automated exposure control, adjustment of the mA and/or kV according to patient size and/or use of iterative reconstruction technique. COMPARISON:  None Available. FINDINGS: Brain: There is no mass, hemorrhage or extra-axial collection. There is generalized atrophy without lobar predilection. Hypodensity of the white matter is most commonly associated with chronic microvascular disease. Old left frontal and right parietal infarcts. Vascular: No abnormal hyperdensity of the major intracranial arteries or dural venous sinuses. No intracranial atherosclerosis. Skull: The visualized skull base, calvarium and extracranial soft tissues are normal. Sinuses/Orbits: No fluid levels or advanced mucosal thickening of the visualized paranasal sinuses. No mastoid or middle ear effusion. The orbits are normal. IMPRESSION: 1. No acute intracranial abnormality. 2. Old left frontal and right parietal infarcts. 3. Generalized atrophy and findings of chronic  microvascular disease. Electronically Signed   By: Ulyses Jarred M.D.   On: 08/10/2022 19:45   MR BRAIN WO CONTRAST  Result Date: 08/10/2022 CLINICAL DATA:  Rule out posterior reversible encephalopathy EXAM: MRI HEAD WITHOUT CONTRAST TECHNIQUE: Multiplanar, multiecho pulse sequences of the brain and surrounding structures were obtained without intravenous contrast. COMPARISON:  CT Head 08/08/22 FINDINGS: Limitations: Assessment is limited due to motion artifact on multiple sequences. Brain: No hydrocephalus. Negative for acute infarct. No hemorrhage. There is extensive periventricular T2/FLAIR hyperintense signal abnormality which correlates with the regions of hypodensity seen on CT brain dated 04/29/2019. No convincing finding to suggest progressed. No extra-axial fluid collection. Vascular: Normal flow voids. Skull and upper cervical spine: Normal marrow signal Sinuses/Orbits: No mastoid or middle ear effusion. Mucosal thickening left maxillary sinus. Orbits are unremarkable. Other: Non IMPRESSION: 1. Within the limitations of a motion degraded exam, no acute intracranial abnormality. No convincing finding to suggest PRES 2. Extensive periventricular T2/FLAIR hyperintense signal abnormality which correlates with the regions of hypodensity seen on CT brain dated 04/29/2019. This is favored to represent changes related to a combination of chronic frontal parietal cortical infarcts superimposed on a background of severe chronic microvascular ischemic Electronically Signed   By: Marin Roberts M.D.   On: 08/10/2022 18:38   DG CHEST PORT 1 VIEW  Result Date: 08/10/2022 CLINICAL DATA:  Altered mental status EXAM: PORTABLE CHEST 1 VIEW COMPARISON:  07/16/2022 FINDINGS: Normal cardiac silhouette. Large for central venous line tip in  the distal SVC. Bibasilar mild airspace disease. No pneumothorax. IMPRESSION: Bibasilar airspace disease representing edema or infection. Electronically Signed   By: Suzy Bouchard  M.D.   On: 08/10/2022 17:18   DG Abd 1 View  Result Date: 08/10/2022 CLINICAL DATA:  Altered mental status EXAM: ABDOMEN - 1 VIEW COMPARISON:  None available FINDINGS: No dilated loops of bowel to indicate ileus or obstruction. No suspicious calcifications are identified within the visualized abdomen. IMPRESSION: Nonobstructive, nonspecific bowel gas pattern. Electronically Signed   By: Miachel Roux M.D.   On: 08/10/2022 17:13   CT Head Wo Contrast  Result Date: 08/08/2022 CLINICAL DATA:  Dialysis seizure. EXAM: CT HEAD WITHOUT CONTRAST TECHNIQUE: Contiguous axial images were obtained from the base of the skull through the vertex without intravenous contrast. RADIATION DOSE REDUCTION: This exam was performed according to the departmental dose-optimization program which includes automated exposure control, adjustment of the mA and/or kV according to patient size and/or use of iterative reconstruction technique. COMPARISON:  07/20/2020 FINDINGS: Brain: No abnormality seen affecting the brainstem. Old small vessel left cerebellar stroke. Cerebral hemispheres show advanced chronic small-vessel ischemic changes throughout the white matter. There is an old left parietal cortical and subcortical infarction with encephalomalacia. There is a small old right frontal infarction. There is a small old right parietal infarction. There is an old right occipital infarction. No evidence of mass, hemorrhage, hydrocephalus or extra-axial collection. Ventricular prominence is consistent with the degree of brain volume loss. Vascular: There is atherosclerotic calcification of the major vessels at the base of the brain. Skull: Negative Sinuses/Orbits: Clear/normal Other: None IMPRESSION: 1. No acute finding by CT. Advanced chronic small-vessel ischemic changes throughout the white matter. Multiple old cortical infarctions as outlined above. 2. Atherosclerotic calcification of the major vessels at the base of the brain.  Electronically Signed   By: Nelson Chimes M.D.   On: 08/08/2022 14:10   IR Fluoro Guide CV Line Right  Result Date: 07/16/2022 INDICATION: End-stage renal disease, no current access for dialysis EXAM: ULTRASOUND GUIDANCE FOR VASCULAR ACCESS RIGHT INTERNAL JUGULAR PERMANENT HEMODIALYSIS CATHETER Date:  07/16/2022 07/16/2022 4:44 pm Radiologist:  Jerilynn Mages. Daryll Brod, MD Guidance:  Ultrasound and fluoroscopic FLUOROSCOPY: Fluoroscopy Time: 0 minutes 54 seconds (9.0 mGy). MEDICATIONS: 2 g Ancef within 1 hour of the procedure ANESTHESIA/SEDATION: Moderate Sedation Time:  None. The patient was continuously monitored during the procedure by the interventional radiology nurse under my direct supervision. CONTRAST:  None. COMPLICATIONS: None immediate. PROCEDURE: Informed consent was obtained from the patient following explanation of the procedure, risks, benefits and alternatives. The patient understands, agrees and consents for the procedure. All questions were addressed. A time out was performed. Maximal barrier sterile technique utilized including caps, mask, sterile gowns, sterile gloves, large sterile drape, hand hygiene, and 2% chlorhexidine scrub. Under sterile conditions and local anesthesia, right internal jugular micropuncture venous access was performed with ultrasound. Images were obtained for documentation. A guide wire was inserted followed by a transitional dilator. Next, a 0.035 guidewire was advanced into the IVC with a 5-French catheter. Measurements were obtained from the right venotomy site to the proximal right atrium. In the right infraclavicular chest, a subcutaneous tunnel was created under sterile conditions and local anesthesia. 1% lidocaine with epinephrine was utilized for this. The 19 cm tip to cuff palindrome catheter was tunneled subcutaneously to the venotomy site and inserted into the SVC/RA junction through a valved peel-away sheath. Position was confirmed with fluoroscopy. Images were  obtained for documentation. Blood was aspirated  from the catheter followed by saline and heparin flushes. The appropriate volume and strength of heparin was instilled in each lumen. Caps were applied. The catheter was secured at the tunnel site with Gelfoam and a pursestring suture. The venotomy site was closed with subcuticular Vicryl suture. Dermabond was applied to the small right neck incision. A dry sterile dressing was applied. The catheter is ready for use. No immediate complications. IMPRESSION: Ultrasound and fluoroscopically guided right internal jugular tunneled hemodialysis catheter (19 cm tip to cuff palindrome catheter). Electronically Signed   By: Jerilynn Mages.  Shick M.D.   On: 07/16/2022 17:12   IR US Guide Vasc Access Right  Result Date: 07/16/2022 INDICATION: End-stage renal disease, no current access for dialysis EXAM: ULTRASOUND GUIDANCE FOR VASCULAR ACCESS RIGHT INTERNAL JUGULAR PERMANENT HEMODIALYSIS CATHETER Date:  07/16/2022 07/16/2022 4:44 pm Radiologist:  Jerilynn Mages. Daryll Brod, MD Guidance:  Ultrasound and fluoroscopic FLUOROSCOPY: Fluoroscopy Time: 0 minutes 54 seconds (9.0 mGy). MEDICATIONS: 2 g Ancef within 1 hour of the procedure ANESTHESIA/SEDATION: Moderate Sedation Time:  None. The patient was continuously monitored during the procedure by the interventional radiology nurse under my direct supervision. CONTRAST:  None. COMPLICATIONS: None immediate. PROCEDURE: Informed consent was obtained from the patient following explanation of the procedure, risks, benefits and alternatives. The patient understands, agrees and consents for the procedure. All questions were addressed. A time out was performed. Maximal barrier sterile technique utilized including caps, mask, sterile gowns, sterile gloves, large sterile drape, hand hygiene, and 2% chlorhexidine scrub. Under sterile conditions and local anesthesia, right internal jugular micropuncture venous access was performed with ultrasound. Images were  obtained for documentation. A guide wire was inserted followed by a transitional dilator. Next, a 0.035 guidewire was advanced into the IVC with a 5-French catheter. Measurements were obtained from the right venotomy site to the proximal right atrium. In the right infraclavicular chest, a subcutaneous tunnel was created under sterile conditions and local anesthesia. 1% lidocaine with epinephrine was utilized for this. The 19 cm tip to cuff palindrome catheter was tunneled subcutaneously to the venotomy site and inserted into the SVC/RA junction through a valved peel-away sheath. Position was confirmed with fluoroscopy. Images were obtained for documentation. Blood was aspirated from the catheter followed by saline and heparin flushes. The appropriate volume and strength of heparin was instilled in each lumen. Caps were applied. The catheter was secured at the tunnel site with Gelfoam and a pursestring suture. The venotomy site was closed with subcuticular Vicryl suture. Dermabond was applied to the small right neck incision. A dry sterile dressing was applied. The catheter is ready for use. No immediate complications. IMPRESSION: Ultrasound and fluoroscopically guided right internal jugular tunneled hemodialysis catheter (19 cm tip to cuff palindrome catheter). Electronically Signed   By: Jerilynn Mages.  Shick M.D.   On: 07/16/2022 17:12   DG Chest 1 View  Result Date: 07/16/2022 CLINICAL DATA:  Shortness of breath. Patient is in need of dialysis. EXAM: CHEST  1 VIEW COMPARISON:  Chest radiograph 06/25/2022 FINDINGS: Increased lung markings in the lower lungs and evidence for some Kerley B lines. Heart size is upper limits of normal but stable. The trachea is midline. Negative for a pneumothorax. Trachea is midline. No acute bone abnormality. IMPRESSION: 1. Increased lung markings in the lower lungs with Kerley B lines. Findings are suggestive for mild interstitial pulmonary edema. Electronically Signed   By: Markus Daft  M.D.   On: 07/16/2022 09:53     Subjective: No acute issues/events overnight   Discharge  Exam: Vitals:   08/12/22 0003 08/12/22 0334  BP: (!) 159/99 (!) 159/97  Pulse: 83 84  Resp: 17 16  Temp: 98.1 F (36.7 C) 98.1 F (36.7 C)  SpO2: 95% 96%   Vitals:   08/11/22 1600 08/11/22 1938 08/12/22 0003 08/12/22 0334  BP: (!) 147/111 (!) 160/111 (!) 159/99 (!) 159/97  Pulse: 96 81 83 84  Resp: '18 18 17 16  '$ Temp: 97.8 F (36.6 C) 98.1 F (36.7 C) 98.1 F (36.7 C) 98.1 F (36.7 C)  TempSrc: Oral Oral Oral Oral  SpO2: 97% 94% 95% 96%  Weight:      Height:        General: Pt is alert, awake, not in acute distress Cardiovascular: RRR, S1/S2 +, no rubs, no gallops Respiratory: CTA bilaterally, no wheezing, no rhonchi Abdominal: Soft, NT, ND, bowel sounds + Extremities: no edema, no cyanosis    The results of significant diagnostics from this hospitalization (including imaging, microbiology, ancillary and laboratory) are listed below for reference.     Microbiology: No results found for this or any previous visit (from the past 240 hour(s)).   Labs: BNP (last 3 results) No results for input(s): "BNP" in the last 8760 hours. Basic Metabolic Panel: Recent Labs  Lab 08/08/22 1244 08/10/22 1142 08/10/22 1718 08/11/22 0459 08/12/22 0357  NA 134* 137  --  136 137  K 3.5 4.0  --  4.2 4.5  CL 95* 94*  --  94* 94*  CO2 18* 26  --  25 26  GLUCOSE 150* 94  --  81 77  BUN 35* 59*  --  74* 85*  CREATININE 12.38* 17.49* 17.57* 20.14* 22.49*  CALCIUM 9.2 9.3  --  9.3 9.8  MG  --  2.8*  --   --   --     Liver Function Tests: Recent Labs  Lab 08/10/22 1142  AST 23  ALT 7  ALKPHOS 73  BILITOT 0.7  PROT 7.8  ALBUMIN 3.4*    No results for input(s): "LIPASE", "AMYLASE" in the last 168 hours. No results for input(s): "AMMONIA" in the last 168 hours. CBC: Recent Labs  Lab 08/08/22 1244 08/10/22 1142 08/10/22 1718 08/11/22 0459 08/12/22 0357  WBC 14.0* 7.3 7.4 5.9  6.1  NEUTROABS 10.7* 5.7  --   --   --   HGB 12.1* 11.2* 11.5* 11.0* 10.9*  HCT 37.9* 35.0* 34.3* 33.1* 32.9*  MCV 103.3* 102.0* 101.2* 97.9 97.1  PLT 24* 53* 55* 72* 102*    Cardiac Enzymes: No results for input(s): "CKTOTAL", "CKMB", "CKMBINDEX", "TROPONINI" in the last 168 hours. BNP: Invalid input(s): "POCBNP" CBG: No results for input(s): "GLUCAP" in the last 168 hours. D-Dimer No results for input(s): "DDIMER" in the last 72 hours. Hgb A1c No results for input(s): "HGBA1C" in the last 72 hours. Lipid Profile No results for input(s): "CHOL", "HDL", "LDLCALC", "TRIG", "CHOLHDL", "LDLDIRECT" in the last 72 hours. Thyroid function studies No results for input(s): "TSH", "T4TOTAL", "T3FREE", "THYROIDAB" in the last 72 hours.  Invalid input(s): "FREET3" Anemia work up No results for input(s): "VITAMINB12", "FOLATE", "FERRITIN", "TIBC", "IRON", "RETICCTPCT" in the last 72 hours. Urinalysis    Component Value Date/Time   COLORURINE STRAW (A) 12/21/2017 0344   APPEARANCEUR CLEAR 12/21/2017 0344   LABSPEC 1.009 12/21/2017 0344   PHURINE 6.0 12/21/2017 0344   GLUCOSEU NEGATIVE 12/21/2017 0344   HGBUR SMALL (A) 12/21/2017 [place] NEGATIVE 12/21/2017 [place] 12/21/2017 0344  PROTEINUR 100 (A) 12/21/2017 0344   UROBILINOGEN 0.2 05/09/2013 1857   NITRITE NEGATIVE 12/21/2017 0344   LEUKOCYTESUR NEGATIVE 12/21/2017 0344   Sepsis Labs Recent Labs  Lab 08/10/22 1142 08/10/22 1718 08/11/22 0459 08/12/22 0357  WBC 7.3 7.4 5.9 6.1    Microbiology No results found for this or any previous visit (from the past 240 hour(s)).   Time coordinating discharge: Over 30 minutes  SIGNED:   Little Ishikawa, DO [facility] 08/12/2022, 7:07 AM Pager   If 7PM-7AM, please contact night-coverage [email or web address]

## 2022-08-13 ENCOUNTER — Telehealth: Payer: Self-pay | Admitting: Nurse Practitioner

## 2022-08-13 ENCOUNTER — Telehealth: Payer: Self-pay

## 2022-08-13 LAB — LAMOTRIGINE LEVEL: Lamotrigine Lvl: 2.3 ug/mL (ref 2.0–20.0)

## 2022-08-13 NOTE — Transitions of Care (Post Inpatient/ED Visit) (Signed)
   08/13/2022  Name: [name] [name] MRN: [id number] DOB: 1980-07-27  Today's TOC FU Call Status: Today's TOC FU Call Status:: Unsuccessul Call (1st Attempt) Unsuccessful Call (1st Attempt) Date: 08/13/22  Attempted to reach the patient regarding the most recent Inpatient/ED visit.  Follow Up Plan: Additional outreach attempts will be made to reach the patient to complete the Transitions of Care (Post Inpatient/ED visit) call.   Signature Eden Lathe, RN

## 2022-08-13 NOTE — Telephone Encounter (Signed)
Transition of care contact from inpatient facility  Date of Discharge: 08/12/2022 Date of Contact: 08/13/2022 Method of contact: Phone  Attempted to contact patient to discuss transition of care from inpatient admission. Patient did not answer the phone. Message was left on the patient's voicemail with call back number [phone].

## 2022-08-14 ENCOUNTER — Telehealth: Payer: Self-pay

## 2022-08-14 NOTE — Transitions of Care (Post Inpatient/ED Visit) (Signed)
   08/14/2022  Name: [name] [name] MRN: [id number] DOB: 07-Apr-1981  Today's TOC FU Call Status: Today's TOC FU Call Status:: Successful TOC FU Call Competed Unsuccessful Call (1st Attempt) Date: 08/13/22 [facility] FU Call Complete Date: 08/14/22  Transition Care Management Follow-up Telephone Call Date of Discharge: 08/12/22 Discharge Facility: [facility]) Type of Discharge: Inpatient Admission Primary Inpatient Discharge Diagnosis:: status epilepticus How have you been since you were released from the hospital?: Better Any questions or concerns?: No  Items Reviewed: Did you receive and understand the discharge instructions provided?: Yes Medications obtained and verified?: Yes (Medications Reviewed) (he said he has all of his medications and did not have any questions.  He also stated that he was at a meeting and would call me back) Do you have support at home?: Yes People in Home: facility resident Name of Support/Comfort Primary Source: his roommate  [place] and Equipment/Supplies: [place] Ordered?: No Any new equipment or medical supplies ordered?: No  Functional Questionnaire:  This was not addressed because the patient said he was in a meeting and would call me back but by the end of the day, he had not called back.   Folllow up appointments reviewed: PCP Follow-up appointment confirmed?: Yes Date of PCP follow-up appointment?: 08/21/22 Follow-up Provider: Geryl Rankins, NP Specialist Hospital Follow-up appointment confirmed?: Yes Date of Specialist follow-up appointment?: 09/06/22 Follow-Up Specialty Provider:: hematology Do you need transportation to your follow-up appointment?: No    SIGNATURE  Eden Lathe, RN

## 2022-08-21 ENCOUNTER — Encounter: Payer: Self-pay | Admitting: Nurse Practitioner

## 2022-08-21 ENCOUNTER — Ambulatory Visit: Payer: 59 | Attending: Nurse Practitioner | Admitting: Nurse Practitioner

## 2022-08-21 VITALS — BP 154/112 | HR 90 | Ht 72.0 in | Wt 237.0 lb

## 2022-08-21 DIAGNOSIS — I69398 Other sequelae of cerebral infarction: Secondary | ICD-10-CM

## 2022-08-21 DIAGNOSIS — R569 Unspecified convulsions: Secondary | ICD-10-CM

## 2022-08-21 DIAGNOSIS — Z91199 Patient's noncompliance with other medical treatment and regimen due to unspecified reason: Secondary | ICD-10-CM

## 2022-08-21 DIAGNOSIS — R760 Raised antibody titer: Secondary | ICD-10-CM | POA: Diagnosis not present

## 2022-08-21 DIAGNOSIS — I10 Essential (primary) hypertension: Secondary | ICD-10-CM | POA: Diagnosis not present

## 2022-08-21 DIAGNOSIS — Z0001 Encounter for general adult medical examination with abnormal findings: Secondary | ICD-10-CM | POA: Diagnosis not present

## 2022-08-21 DIAGNOSIS — G40901 Epilepsy, unspecified, not intractable, with status epilepticus: Secondary | ICD-10-CM | POA: Diagnosis not present

## 2022-08-21 DIAGNOSIS — Z0000 Encounter for general adult medical examination without abnormal findings: Secondary | ICD-10-CM

## 2022-08-21 MED ORDER — CARVEDILOL 6.25 MG PO TABS: 6.2500 mg | ORAL_TABLET | Freq: Two times a day (BID) | ORAL | 1 refills | Status: DC

## 2022-08-21 MED ORDER — CARVEDILOL 12.5 MG PO TABS: 12.5000 mg | ORAL_TABLET | Freq: Two times a day (BID) | ORAL | 1 refills | Status: DC

## 2022-08-21 NOTE — Progress Notes (Signed)
Assessment & Plan:  [name] [name] was seen today for annual exam.  Diagnoses and all orders for this visit:  Encounter for annual physical exam  Primary hypertension Carvedilol dose increased. Continue amlodipine as prescribed -     carvedilol (COREG) 12.5 MG tablet; Take 1 tablet (12.5 mg total) by mouth 2 (two) times daily with a meal. DOSE CHANGE  Lupus anticoagulant positive -     Ambulatory referral to Rheumatology  Status epilepticus [facility]) -     Ambulatory referral to Neurology    Patient has been counseled on age-appropriate routine health concerns for screening and prevention. These are reviewed and up-to-date. Referrals have been placed accordingly. Immunizations are up-to-date or declined.    Subjective:   Chief Complaint  Patient presents with   Annual Exam   HPI [name] [name] 42 y.o. male presents to office today for physical exam  I have not seen [name] [name] in over one year.   He is not following up with Neurology for his history of seizures despite my referrals, oncology for his thrombocytopenia or rheumatology for his positive lupus anticoagulant panel. He states he is taking eliquis but he does not have this pill bottle with him today. M-W-F HD days for ESRD. Recently hospitalized for seizure while receiving HD. His Lamictal was changed to once daily keppra with hopes of better compliance.  He has a history of non adherence to his medications and is confused about what he should be taking or what his medications are for.   Blood pressure is elevated. I am increasing carvedilol to 12.5 mg BID today. He will continue on amlodipine 10 mg daily as well.  BP Readings from Last 3 Encounters:  08/21/22 (!) 153/95  08/12/22 (!) 159/97  08/08/22 (!) 146/91    Review of Systems  Constitutional:  Negative for fever, malaise/fatigue and weight loss.  HENT: Negative.  Negative for nosebleeds.   Eyes: Negative.  Negative for blurred vision, double vision and photophobia.   Respiratory: Negative.  Negative for cough and shortness of breath.   Cardiovascular: Negative.  Negative for chest pain, palpitations and leg swelling.  Gastrointestinal: Negative.  Negative for heartburn, nausea and vomiting.  Genitourinary: Negative.   Musculoskeletal: Negative.  Negative for myalgias.  Skin: Negative.   Neurological: Negative.  Negative for dizziness, focal weakness, seizures and headaches.  Endo/Heme/Allergies: Negative.   Psychiatric/Behavioral: Negative.  Negative for suicidal ideas.     Past Medical History:  Diagnosis Date   Chronic kidney disease    ESRD (end stage renal disease) ([place])    Hypertension    Lupus ([place])    Seizures ([place])    Seizures ([place])    Stroke ([place])    1 or 42 years of age.     Wears glasses     Past Surgical History:  Procedure Laterality Date   AV FISTULA PLACEMENT Left 12/30/2017   Procedure: ARTERIOVENOUS (AV) FISTULA CREATION VERSUS INSERTION OF ARTERIOVENOUS GRAFT LEFT ARM;  Surgeon: Rosetta Posner, MD;  Location: [place];  Service: Vascular;  Laterality: Left;   [place] Left 08/15/2018   Procedure: LEFT FIRST STAGE [place];  Surgeon: Rosetta Posner, MD;  Location: [place];  Service: Vascular;  Laterality: Left;   [place] Left 10/30/2018   Procedure: INSERTION OF GORE-TEX GRAFT LEFT UPPER ARM;  Surgeon: Waynetta Sandy, MD;  Location: [place];  Service: Vascular;  Laterality: Left;   INSERTION OF DIALYSIS CATHETER N/A 09/20/2018  Procedure: INSERTION OF TUNNELED DIALYSIS CATHETER RIGHT INTERNAL JUGULAR;  Surgeon: Marty Heck, MD;  [facility];  Service: Vascular;  Laterality: N/A;   IR FLUORO GUIDE CV LINE RIGHT  07/16/2022   IR US GUIDE VASC ACCESS RIGHT  07/16/2022   RENAL BIOPSY      Family History  Problem Relation Age of Onset   Diabetes Mother    Diabetes Maternal Grandmother    Seizures Neg Hx        not on mother's side. he doesn't know about  his father's side    Social History Reviewed with no changes to be made today.   Outpatient Medications Prior to Visit  Medication Sig Dispense Refill   amLODipine (NORVASC) 10 MG tablet TAKE 1 TABLET BY MOUTH AT BEDTIME 90 tablet 1   apixaban (ELIQUIS) 2.5 MG TABS tablet Take 1 tablet (2.5 mg total) by mouth 2 (two) times daily. 180 tablet 1   cinacalcet (SENSIPAR) 90 MG tablet Take 90 mg by mouth daily.     levETIRAcetam (KEPPRA) 1000 MG tablet Take 1 tablet (1,000 mg total) by mouth every evening. Make sure to take after dialysis on dialysis days. 30 tablet 2   rosuvastatin (CRESTOR) 40 MG tablet Take 40 mg by mouth daily.     carvedilol (COREG) 6.25 MG tablet Take 1 tablet (6.25 mg total) by mouth 2 (two) times daily with a meal. 180 tablet 1   No facility-administered medications prior to visit.    Allergies  Allergen Reactions   Zyrtec [Cetirizine] Swelling    FACIAL SWELLING, NOT CATEGORIZED       Objective:    BP (!) 153/95   Pulse 90   Ht 6' (1.829 m)   Wt 237 lb (107.5 kg)   SpO2 97%   BMI 32.14 kg/m  Wt Readings from Last 3 Encounters:  08/21/22 237 lb (107.5 kg)  08/10/22 232 lb 5.8 oz (105.4 kg)  06/25/22 280 lb (127 kg)    Physical Exam Constitutional:      Appearance: He is well-developed.  HENT:     Head: Normocephalic and atraumatic.     Right Ear: Hearing, tympanic membrane, ear canal and external ear normal.     Left Ear: Hearing, tympanic membrane, ear canal and external ear normal.     Nose: Nose normal. No mucosal edema or rhinorrhea.     Right Turbinates: Not enlarged.     Left Turbinates: Not enlarged.     Mouth/Throat:     Lips: Pink.     Mouth: Mucous membranes are moist.     Dentition: No gingival swelling, dental abscesses or gum lesions.     Pharynx: Uvula midline.     Tonsils: No tonsillar exudate. 1+ on the right. 1+ on the left.  Eyes:     General: Lids are normal. No scleral icterus.    Extraocular Movements: Extraocular  movements intact.     Conjunctiva/sclera: Conjunctivae normal.     Pupils: Pupils are equal, round, and reactive to light.  Neck:     Thyroid: No thyromegaly.     Trachea: No tracheal deviation.  Cardiovascular:     Rate and Rhythm: Normal rate and regular rhythm.     Heart sounds: Normal heart sounds. No murmur heard.    No friction rub. No gallop.  Pulmonary:     Effort: Pulmonary effort is normal. No respiratory distress.     Breath sounds: Normal breath sounds. No wheezing or rales.  Chest:  Chest wall: No mass or tenderness.  Breasts:    Right: No inverted nipple, mass, nipple discharge, skin change or tenderness.     Left: No inverted nipple, mass, nipple discharge, skin change or tenderness.  Abdominal:     General: Bowel sounds are normal. There is no distension.     Palpations: Abdomen is soft. There is no mass.     Tenderness: There is no abdominal tenderness. There is no guarding or rebound.  Musculoskeletal:        General: No tenderness or deformity. Normal range of motion.     Cervical back: Normal range of motion and neck supple.  Lymphadenopathy:     Cervical: No cervical adenopathy.  Skin:    General: Skin is warm and dry.     Capillary Refill: Capillary refill takes less than 2 seconds.     Findings: No erythema.  Neurological:     Mental Status: He is alert and oriented to person, place, and time.     Cranial Nerves: No cranial nerve deficit.     Sensory: Sensation is intact.     Motor: No abnormal muscle tone.     Coordination: Coordination is intact. Coordination normal.     Gait: Gait is intact.     Deep Tendon Reflexes: Reflexes normal.     Reflex Scores:      Patellar reflexes are 1+ on the right side and 1+ on the left side. Psychiatric:        Attention and Perception: Attention normal.        Mood and Affect: Mood normal.        Speech: Speech normal.        Behavior: Behavior normal.        Thought Content: Thought content normal.         Judgment: Judgment normal.          Patient has been counseled extensively about nutrition and exercise as well as the importance of adherence with medications and regular follow-up. The patient was given clear instructions to go to ER or return to medical center if symptoms don't improve, worsen or new problems develop. The patient verbalized understanding.   Follow-up: Return for 6-8 weeks BP check with [facility].   Gildardo Pounds, FNP-BC [name], [place]   08/21/2022, 3:42 PM

## 2022-08-22 ENCOUNTER — Other Ambulatory Visit: Payer: Self-pay

## 2022-08-22 ENCOUNTER — Encounter: Payer: Self-pay | Admitting: Nurse Practitioner

## 2022-08-22 DIAGNOSIS — N186 End stage renal disease: Secondary | ICD-10-CM

## 2022-08-22 MED ORDER — AMLODIPINE BESYLATE 10 MG PO TABS: ORAL_TABLET | Freq: Every day | ORAL | 1 refills | Status: DC

## 2022-08-22 MED ORDER — AMLODIPINE BESYLATE 10 MG PO TABS: 10.0000 mg | ORAL_TABLET | Freq: Every day | ORAL | 1 refills | Status: DC

## 2022-08-24 ENCOUNTER — Ambulatory Visit (HOSPITAL_COMMUNITY): Payer: 59

## 2022-08-24 ENCOUNTER — Ambulatory Visit (HOSPITAL_COMMUNITY): Payer: 59 | Attending: Vascular Surgery

## 2022-08-29 ENCOUNTER — Other Ambulatory Visit: Payer: Self-pay | Admitting: Physician Assistant

## 2022-08-29 ENCOUNTER — Encounter: Payer: 59 | Admitting: Vascular Surgery

## 2022-08-29 ENCOUNTER — Encounter (HOSPITAL_COMMUNITY): Payer: Self-pay

## 2022-08-29 DIAGNOSIS — Z7901 Long term (current) use of anticoagulants: Secondary | ICD-10-CM

## 2022-08-29 DIAGNOSIS — R569 Unspecified convulsions: Secondary | ICD-10-CM

## 2022-08-29 NOTE — Telephone Encounter (Signed)
Atorvastatin- no longer on current medication list Requested Prescriptions  Pending Prescriptions Disp Refills   ELIQUIS 2.5 MG TABS tablet [Pharmacy Med Name: ELIQUIS 2.'5MG'$  TABLETS] 180 tablet 1    Sig: TAKE 1 TABLET(2.5 MG) BY MOUTH TWICE DAILY     Hematology:  Anticoagulants - apixaban Failed - 08/29/2022  6:26 AM      Failed - PLT in normal range and within 360 days    Platelets  Date Value Ref Range Status  08/12/2022 102 (L) 150 - 400 K/uL Final  09/27/2020 69 (LL) 150 - 450 x10E3/uL Final    Comment:    Actual platelet count may be somewhat higher than reported due to aggregation of platelets in this sample.          Failed - HGB in normal range and within 360 days    Hemoglobin  Date Value Ref Range Status  08/12/2022 10.9 (L) 13.0 - 17.0 g/dL Final  09/27/2020 13.0 13.0 - 17.7 g/dL Final   HGB  Date Value Ref Range Status  10/14/2013 15.5 13.0 - 17.1 g/dL Final         Failed - HCT in normal range and within 360 days    HCT  Date Value Ref Range Status  08/12/2022 32.9 (L) 39.0 - 52.0 % Final  10/14/2013 45.6 38.4 - 49.9 % Final   Hematocrit  Date Value Ref Range Status  09/27/2020 38.5 37.5 - 51.0 % Final         Failed - Cr in normal range and within 360 days    Creat  Date Value Ref Range Status  01/24/2021 15.72 (H) 0.60 - 1.29 mg/dL Final    Comment:    Verified by repeat analysis. .    Creatinine, Ser  Date Value Ref Range Status  08/12/2022 22.49 (H) 0.61 - 1.24 mg/dL Final   Creatinine, POC  Date Value Ref Range Status  12/10/2016 200 mg/dL Final   Creatinine, Urine  Date Value Ref Range Status  12/21/2017 86.75 mg/dL Final    Comment:    Performed at [facility], [place] [address] [place], [place] [address]  12/21/2017 87.57 mg/dL Final         Passed - AST in normal range and within 360 days    AST  Date Value Ref Range Status  08/10/2022 23 15 - 41 U/L Final         Passed - ALT in normal range and within 360 days    ALT   Date Value Ref Range Status  08/10/2022 7 0 - 44 U/L Final         Passed - Valid encounter within last 12 months    Recent Outpatient Visits           1 week ago Encounter for annual physical exam   [place] [facility], Vernia Buff, NP   1 month ago Seizure, late effect of stroke [facility])   [place], Vernia Buff, NP   5 months ago Chronic anticoagulation   [facility], Dionne Bucy, [place]   1 year ago Primary hypertension   [place], Vernia Buff, NP   3 years ago Essential hypertension   [place] Gildardo Pounds, NP       Future Appointments  In 5 days Rice, Resa Miner, MD [facility] Rheumatology   In 1 month Daisy [place], Jarome Matin, [place]            Refused Prescriptions Disp Refills   atorvastatin (LIPITOR) 40 MG tablet [Pharmacy Med Name: ATORVASTATIN '40MG'$  TABLETS] 90 tablet 1    Sig: TAKE 1 TABLET(40 MG) BY MOUTH DAILY     Cardiovascular:  Antilipid - Statins Failed - 08/29/2022  6:26 AM      Failed - Lipid Panel in normal range within the last 12 months    Cholesterol, Total  Date Value Ref Range Status  09/27/2020 194 100 - 199 mg/dL Final   LDL Chol Calc (NIH)  Date Value Ref Range Status  09/27/2020 114 (H) 0 - 99 mg/dL Final   HDL  Date Value Ref Range Status  09/27/2020 56 >39 mg/dL Final   Triglycerides  Date Value Ref Range Status  09/27/2020 138 0 - 149 mg/dL Final         Passed - Patient is not pregnant      Passed - Valid encounter within last 12 months    Recent Outpatient Visits           1 week ago Encounter for annual physical exam   [place], [place], NP   1 month ago Seizure, late effect of stroke [facility])   [place], Vernia Buff, NP   5 months ago Chronic anticoagulation   [facility], Dionne Bucy, [place]   1 year ago Primary hypertension   [place], Vernia Buff, NP   3 years ago Essential hypertension   [place], NP       Future Appointments             In 5 days Rice, Resa Miner, MD [facility] Rheumatology   In 1 month Daisy Blossom, Jarome Matin, [place]

## 2022-08-30 ENCOUNTER — Encounter (HOSPITAL_COMMUNITY): Payer: Self-pay

## 2022-09-02 NOTE — Progress Notes (Deleted)
Office Visit Note  Patient: [name] [name]             Date of Birth: [birth year]           MRN: [id number]             PCP: Gildardo Pounds, NP Referring: Gildardo Pounds, NP Visit Date: 09/03/2022   Subjective:  No chief complaint on file.   History of Present Illness: [name] [name] is a 42 y.o. male here for follow up ***   Previous HPI 01/24/21 [name] [name] is a 42 y.o. male here for systemic lupus erythematosus. 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  He was recently diagnosed more than a decade ago and underwent induction treatment with high-dose glucocorticoids and was on mycophenolate previously.  He saw [facility] for evaluation of extrarenal disease without a lot of systemic symptoms though he had 2 previous strokes with subsequent encephalomalacia and seizures followed by neurology.  He is on apixaban for long-term anticoagulation apparently previous positives lupus anticoagulant test not confirmed on repeat testing.  He is now on hemodialysis through a left arm arteriovenous fistula access.  He has been off any specific lupus treatment or immunosuppression for some time, previous records indicate hydroxychloroquine prescription but patient denies taking this medicine last year.  Currently he is feeling overall well.  He has reported some type of skin rashes not clear whether exactly photosensitive or not.  He has significant dryness and pruritus worsens with heat exposure.  Currently denies alopecia, oral ulcers, lymphadenopathy, fevers, pleurisy, joint pains, Raynaud's symptoms.   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   No Rheumatology ROS completed.   PMFS History:  Patient Active Problem List   Diagnosis Date Noted   Status epilepticus ([place]) 08/10/2022   ESRD on hemodialysis ([place]) 02/15/2022   Other disorders of phosphorus metabolism 08/18/2021   Fluid overload, unspecified 05/10/2021   Allergy, unspecified, initial encounter 04/19/2021   Anaphylactic shock, unspecified, sequela 04/19/2021   Pruritus, unspecified 04/19/2021   Hypocalcemia 04/01/2021   Long-term current use of anticonvulsant 08/04/2020   COVID-19 07/26/2020   Breakthrough seizure ([place]) 07/20/2020   Acute respiratory failure with hypoxia (HCC)    Lupus ([place])    Seizure, late effect of stroke ([place]) 08/26/2019   Secondary hyperparathyroidism of renal origin ([place]) 11/05/2018   Anemia in chronic kidney disease 09/23/2018   ESRD (end stage renal disease) ([place]) 03/18/2018   Prediabetes 01/10/2018   Chest pain in adult    CKD (chronic kidney disease) stage 3, GFR 30-59 ml/min ([place]) 10/24/2015   Seizures ([place]) 10/24/2015   HTN (hypertension) 01/12/2015   Chronic anticoagulation 04/24/2011   Lupus anticoagulant positive 04/24/2011   OBESITY, NOS 08/15/2006   THROMBOCYTOPENIA 08/15/2006   TOBACCO DEPENDENCE 08/15/2006   CVA 08/15/2006    Past Medical History:  Diagnosis Date   Chronic kidney disease    ESRD (end stage renal disease) ([place])    Hypertension    Lupus ([place])    Seizures ([place])    Seizures ([place])    Stroke ([place])    22  or 42 years of age.     Wears glasses     Family History  Problem Relation Age of Onset   Diabetes Mother    Diabetes Maternal Grandmother    Seizures Neg Hx        not on mother's side. he doesn't know about his father's side   Past Surgical History:  Procedure Laterality Date   AV FISTULA PLACEMENT Left 12/30/2017   Procedure: ARTERIOVENOUS (AV) FISTULA CREATION VERSUS INSERTION OF ARTERIOVENOUS GRAFT LEFT ARM;  Surgeon: Rosetta Posner, MD;  Location: [facility];  Service:  Vascular;  Laterality: Left;   [place] Left 08/15/2018   Procedure: LEFT FIRST STAGE [place];  Surgeon: Rosetta Posner, MD;  Location: [facility];  Service: Vascular;  Laterality: Left;   [place] Left 10/30/2018   Procedure: INSERTION OF GORE-TEX GRAFT LEFT UPPER ARM;  Surgeon: Waynetta Sandy, MD;  Location: [facility];  Service: Vascular;  Laterality: Left;   INSERTION OF DIALYSIS CATHETER N/A 09/20/2018   Procedure: INSERTION OF TUNNELED DIALYSIS CATHETER RIGHT INTERNAL JUGULAR;  Surgeon: Marty Heck, MD;  Location: [facility];  Service: Vascular;  Laterality: N/A;   IR FLUORO GUIDE CV LINE RIGHT  07/16/2022   IR US GUIDE VASC ACCESS RIGHT  07/16/2022   RENAL BIOPSY     Social History   Social History Narrative   Lives with a friend   Right handed   Caffeine: rare   Immunization History  Administered Date(s) Administered   Hepatitis B, ADULT 10/29/2018, 11/26/2018, 12/24/2018, 05/01/2019   Influenza,inj,Quad PF,6+ Mos 07/11/2017, 07/19/2017, 07/07/2019, 04/25/2020   PFIZER(Purple Top)SARS-COV-2 Vaccination 02/19/2020, 03/14/2020   Pneumococcal Conjugate-13 01/14/2019   Pneumococcal Polysaccharide-23 12/22/2017, 03/18/2019   Tdap 01/12/2015     Objective: Vital Signs: There were no vitals taken for this visit.   Physical Exam   Musculoskeletal Exam: ***  CDAI Exam: CDAI Score: -- Patient Global: --; Provider Global: -- Swollen: --; Tender: -- Joint Exam 09/03/2022   No joint exam has been documented for this visit   There is currently no information documented on the homunculus. Go to the Rheumatology activity and complete the homunculus joint exam.  Investigation: No additional findings.  Imaging: CT HEAD WO CONTRAST (5MM)  Result Date: 08/10/2022 CLINICAL DATA:  Seizure disorder EXAM: CT HEAD WITHOUT CONTRAST TECHNIQUE: Contiguous axial images were obtained from the base of the skull through the vertex  without intravenous contrast. RADIATION DOSE REDUCTION: This exam was performed according to the departmental dose-optimization program which includes automated exposure control, adjustment of the mA and/or kV according to patient size and/or use of iterative reconstruction technique. COMPARISON:  None Available. FINDINGS: Brain: There is no mass, hemorrhage or extra-axial collection. There is generalized atrophy without lobar predilection. Hypodensity of the white matter is most commonly associated with chronic microvascular disease. Old left frontal and right parietal infarcts. Vascular: No abnormal hyperdensity of the major intracranial arteries or dural venous sinuses. No intracranial atherosclerosis. Skull: The visualized skull base, calvarium and extracranial soft tissues are normal. Sinuses/Orbits: No fluid levels or advanced mucosal thickening of the visualized paranasal sinuses. No mastoid or middle ear effusion. The orbits are normal. IMPRESSION: 1. No acute intracranial abnormality. 2. Old left frontal and right parietal infarcts. 3. Generalized atrophy and findings of chronic microvascular disease. Electronically Signed   By: Ulyses Jarred M.D.   On: 08/10/2022 19:45   MR BRAIN WO CONTRAST  Result Date: 08/10/2022 CLINICAL DATA:  Rule out  posterior reversible encephalopathy EXAM: MRI HEAD WITHOUT CONTRAST TECHNIQUE: Multiplanar, multiecho pulse sequences of the brain and surrounding structures were obtained without intravenous contrast. COMPARISON:  CT Head 08/08/22 FINDINGS: Limitations: Assessment is limited due to motion artifact on multiple sequences. Brain: No hydrocephalus. Negative for acute infarct. No hemorrhage. There is extensive periventricular T2/FLAIR hyperintense signal abnormality which correlates with the regions of hypodensity seen on CT brain dated 04/29/2019. No convincing finding to suggest progressed. No extra-axial fluid collection. Vascular: Normal flow voids. Skull and upper  cervical spine: Normal marrow signal Sinuses/Orbits: No mastoid or middle ear effusion. Mucosal thickening left maxillary sinus. Orbits are unremarkable. Other: Non IMPRESSION: 1. Within the limitations of a motion degraded exam, no acute intracranial abnormality. No convincing finding to suggest PRES 2. Extensive periventricular T2/FLAIR hyperintense signal abnormality which correlates with the regions of hypodensity seen on CT brain dated 04/29/2019. This is favored to represent changes related to a combination of chronic frontal parietal cortical infarcts superimposed on a background of severe chronic microvascular ischemic Electronically Signed   By: Marin Roberts M.D.   On: 08/10/2022 18:38   DG CHEST PORT 1 VIEW  Result Date: 08/10/2022 CLINICAL DATA:  Altered mental status EXAM: PORTABLE CHEST 1 VIEW COMPARISON:  07/16/2022 FINDINGS: Normal cardiac silhouette. Large for central venous line tip in the distal SVC. Bibasilar mild airspace disease. No pneumothorax. IMPRESSION: Bibasilar airspace disease representing edema or infection. Electronically Signed   By: Suzy Bouchard M.D.   On: 08/10/2022 17:18   DG Abd 1 View  Result Date: 08/10/2022 CLINICAL DATA:  Altered mental status EXAM: ABDOMEN - 1 VIEW COMPARISON:  None available FINDINGS: No dilated loops of bowel to indicate ileus or obstruction. No suspicious calcifications are identified within the visualized abdomen. IMPRESSION: Nonobstructive, nonspecific bowel gas pattern. Electronically Signed   By: Miachel Roux M.D.   On: 08/10/2022 17:13   CT Head Wo Contrast  Result Date: 08/08/2022 CLINICAL DATA:  Dialysis seizure. EXAM: CT HEAD WITHOUT CONTRAST TECHNIQUE: Contiguous axial images were obtained from the base of the skull through the vertex without intravenous contrast. RADIATION DOSE REDUCTION: This exam was performed according to the departmental dose-optimization program which includes automated exposure control, adjustment of the mA  and/or kV according to patient size and/or use of iterative reconstruction technique. COMPARISON:  07/20/2020 FINDINGS: Brain: No abnormality seen affecting the brainstem. Old small vessel left cerebellar stroke. Cerebral hemispheres show advanced chronic small-vessel ischemic changes throughout the white matter. There is an old left parietal cortical and subcortical infarction with encephalomalacia. There is a small old right frontal infarction. There is a small old right parietal infarction. There is an old right occipital infarction. No evidence of mass, hemorrhage, hydrocephalus or extra-axial collection. Ventricular prominence is consistent with the degree of brain volume loss. Vascular: There is atherosclerotic calcification of the major vessels at the base of the brain. Skull: Negative Sinuses/Orbits: Clear/normal Other: None IMPRESSION: 1. No acute finding by CT. Advanced chronic small-vessel ischemic changes throughout the white matter. Multiple old cortical infarctions as outlined above. 2. Atherosclerotic calcification of the major vessels at the base of the brain. Electronically Signed   By: Nelson Chimes M.D.   On: 08/08/2022 14:10    Recent Labs: Lab Results  Component Value Date   WBC 6.1 08/12/2022   HGB 10.9 (L) 08/12/2022   PLT 102 (L) 08/12/2022   NA 137 08/12/2022   K 4.5 08/12/2022   CL 94 (L) 08/12/2022   CO2 26 08/12/2022  GLUCOSE 77 08/12/2022   BUN 85 (H) 08/12/2022   CREATININE 22.49 (H) 08/12/2022   BILITOT 0.7 08/10/2022   ALKPHOS 73 08/10/2022   AST 23 08/10/2022   ALT 7 08/10/2022   PROT 7.8 08/10/2022   ALBUMIN 3.4 (L) 08/10/2022   CALCIUM 9.8 08/12/2022   GFRAA 4 (L) 01/14/2020    Speciality Comments: No specialty comments available.  Procedures:  No procedures performed Allergies: Zyrtec [cetirizine]   Assessment / Plan:     Visit Diagnoses: No diagnosis found.  ***  Orders: No orders of the defined types were placed in this encounter.  No  orders of the defined types were placed in this encounter.    Follow-Up Instructions: No follow-ups on file.   Collier Salina, MD  Note - This record has been created using [employer].  Chart creation errors have been sought, but may not always  have been located. Such creation errors do not reflect on  the standard of medical care.

## 2022-09-03 ENCOUNTER — Ambulatory Visit: Payer: 59 | Admitting: Internal Medicine

## 2022-09-06 ENCOUNTER — Inpatient Hospital Stay: Payer: 59

## 2022-09-06 ENCOUNTER — Inpatient Hospital Stay: Payer: 59 | Attending: Hematology and Oncology | Admitting: Hematology and Oncology

## 2022-09-07 ENCOUNTER — Encounter: Payer: Self-pay | Admitting: *Deleted

## 2022-09-20 ENCOUNTER — Telehealth: Payer: Self-pay | Admitting: Nurse Practitioner

## 2022-09-20 NOTE — Telephone Encounter (Signed)
Called patient to schedule Medicare Annual Wellness Visit (AWV). No voicemail available to leave a message.  Last date of AWV: awvi 05/18/22 per [facility]    If any questions, please contact me at [phone].  Thank you ,  Barkley Boards AWV direct phone # [phone]

## 2022-09-26 ENCOUNTER — Encounter (HOSPITAL_COMMUNITY): Payer: Self-pay

## 2022-10-02 ENCOUNTER — Ambulatory Visit: Payer: 59 | Admitting: Pharmacist

## 2022-10-02 NOTE — Progress Notes (Unsigned)
   S:     No chief complaint on file.  42 y.o. male who presents for hypertension evaluation, education, and management.  PMH is significant for HTN, CVA, seizures, ESRD on dialysis, lupus anticoagulant.  Patient was referred and last seen by Primary Care Provider, Bertram Denver, on 08/21/2022.  At last visit, BP above goal at 154/112 and increased carvedilol to 12.5 mg BID.   Today, patient arrives in good spirits and presents without *** assistance. *** Denies dizziness, headache, blurred vision, swelling.   Patient reports hypertension longstanding   Family/Social history:  Diabetes in mother and maternal grandmother  Medication adherence *** . Patient has *** taken BP medications today.   Current antihypertensives include: amlodipine 10 mg daily, carvedilol 12.5 mg BID  Antihypertensives tried in the past include: lisinopril-HCTZ (stopped d/t kidney function)  Reported home BP readings: ***  Patient reported dietary habits: Eats *** meals/day Adheres to sodium restricted diet *** Caffeine ***  Patient-reported exercise habits: ***  ASCVD risk factors include: ***  O:  There were no vitals filed for this visit.  Last 3 Office BP readings: BP Readings from Last 3 Encounters:  08/21/22 (!) 154/112  08/12/22 (!) 159/97  08/08/22 (!) 146/91    BMET    Component Value Date/Time   NA 137 08/12/2022 0357   NA 138 09/27/2020 1140   K 4.5 08/12/2022 0357   CL 94 (L) 08/12/2022 0357   CO2 26 08/12/2022 0357   GLUCOSE 77 08/12/2022 0357   BUN 85 (H) 08/12/2022 0357   BUN 50 (H) 09/27/2020 1140   CREATININE 22.49 (H) 08/12/2022 0357   CREATININE 15.72 (H) 01/24/2021 0938   CALCIUM 9.8 08/12/2022 0357   CALCIUM 8.4 (L) 12/23/2017 0237   GFRNONAA 2 (L) 08/12/2022 0357   GFRNONAA 40 (L) 10/24/2015 1603   GFRAA 4 (L) 01/14/2020 2020   GFRAA 47 (L) 10/24/2015 1603    Renal function: CrCl cannot be calculated (Patient's most recent lab result is older than the maximum  21 days allowed.).  Clinical ASCVD: {YES/NO:21197} The ASCVD Risk score (Arnett DK, et al., 2019) failed to calculate for the following reasons:   The patient has a prior MI or stroke diagnosis    A/P: Hypertension longstanding currently *** on current medications. BP goal < 130/80 mmHg. Medication adherence appears ***. Control is suboptimal due to ***.  -{Meds adjust:18428} ***.  -Continued amlodipine 10 mg daily.  -Continued carvedilol 12.5 mg BID.  -Patient educated on purpose, proper use, and potential adverse effects of ***.  -F/u labs ordered - *** -Counseled on lifestyle modifications for blood pressure control including reduced dietary sodium, increased exercise, adequate sleep. -Encouraged patient to check BP at home and bring log of readings to next visit. Counseled on proper use of home BP cuff.   Results reviewed and written information provided.    Written patient instructions provided. Patient verbalized understanding of treatment plan.  Total time in face to face counseling 30 minutes.    Follow-up:  Pharmacist 1 month.  Georga Hacking, Pharm.D PGY1 Pharmacy Resident 10/02/2022 10:54 AM

## 2022-10-03 ENCOUNTER — Ambulatory Visit (HOSPITAL_COMMUNITY): Payer: 59 | Attending: Vascular Surgery

## 2022-10-03 ENCOUNTER — Encounter: Payer: 59 | Admitting: Vascular Surgery

## 2022-10-03 ENCOUNTER — Ambulatory Visit (HOSPITAL_COMMUNITY): Payer: 59

## 2022-10-03 ENCOUNTER — Other Ambulatory Visit (HOSPITAL_COMMUNITY): Payer: 59

## 2022-10-12 ENCOUNTER — Emergency Department (HOSPITAL_COMMUNITY)
Admission: EM | Admit: 2022-10-12 | Discharge: 2022-10-12 | Disposition: A | Discharge status: Home / Self Care | Payer: 59 | Attending: Emergency Medicine | Admitting: Emergency Medicine

## 2022-10-12 ENCOUNTER — Encounter (HOSPITAL_COMMUNITY): Payer: Self-pay

## 2022-10-12 ENCOUNTER — Other Ambulatory Visit: Payer: Self-pay

## 2022-10-12 DIAGNOSIS — R569 Unspecified convulsions: Secondary | ICD-10-CM

## 2022-10-12 DIAGNOSIS — D696 Thrombocytopenia, unspecified: Secondary | ICD-10-CM | POA: Diagnosis not present

## 2022-10-12 DIAGNOSIS — I120 Hypertensive chronic kidney disease with stage 5 chronic kidney disease or end stage renal disease: Secondary | ICD-10-CM | POA: Insufficient documentation

## 2022-10-12 DIAGNOSIS — D72829 Elevated white blood cell count, unspecified: Secondary | ICD-10-CM | POA: Diagnosis not present

## 2022-10-12 DIAGNOSIS — N186 End stage renal disease: Secondary | ICD-10-CM | POA: Diagnosis not present

## 2022-10-12 LAB — COMPREHENSIVE METABOLIC PANEL
ALT: 16 U/L (ref 0–44)
AST: 24 U/L (ref 15–41)
Albumin: 3.6 g/dL (ref 3.5–5.0)
Alkaline Phosphatase: 79 U/L (ref 38–126)
Anion gap: 19 — ABNORMAL HIGH (ref 5–15)
BUN: 33 mg/dL — ABNORMAL HIGH (ref 6–20)
CO2: 23 mmol/L (ref 22–32)
Calcium: 9.6 mg/dL (ref 8.9–10.3)
Chloride: 95 mmol/L — ABNORMAL LOW (ref 98–111)
Creatinine, Ser: 11.48 mg/dL — ABNORMAL HIGH (ref 0.61–1.24)
GFR, Estimated: 3 mL/min — ABNORMAL LOW (ref >60)
Glucose, Bld: 106 mg/dL — ABNORMAL HIGH (ref 70–99)
Potassium: 4.1 mmol/L (ref 3.5–5.1)
Sodium: 137 mmol/L (ref 135–145)
Total Bilirubin: 0.7 mg/dL (ref 0.3–1.2)
Total Protein: 8.3 g/dL — ABNORMAL HIGH (ref 6.5–8.1)

## 2022-10-12 LAB — CBC WITH DIFFERENTIAL/PLATELET
Abs Immature Granulocytes: 0.1 10*3/uL — ABNORMAL HIGH (ref 0.00–0.07)
Basophils Absolute: 0.1 10*3/uL (ref 0.0–0.1)
Basophils Relative: 0 %
Eosinophils Absolute: 0.2 10*3/uL (ref 0.0–0.5)
Eosinophils Relative: 1 %
HCT: 33.4 % — ABNORMAL LOW (ref 39.0–52.0)
Hemoglobin: 10.5 g/dL — ABNORMAL LOW (ref 13.0–17.0)
Immature Granulocytes: 1 %
Lymphocytes Relative: 18 %
Lymphs Abs: 2.8 10*3/uL (ref 0.7–4.0)
MCH: 30.4 pg (ref 26.0–34.0)
MCHC: 31.4 g/dL (ref 30.0–36.0)
MCV: 96.8 fL (ref 80.0–100.0)
Monocytes Absolute: 1.3 10*3/uL — ABNORMAL HIGH (ref 0.1–1.0)
Monocytes Relative: 8 %
Neutro Abs: 11.3 10*3/uL — ABNORMAL HIGH (ref 1.7–7.7)
Neutrophils Relative %: 72 %
Platelets: 64 10*3/uL — ABNORMAL LOW (ref 150–400)
RBC: 3.45 MIL/uL — ABNORMAL LOW (ref 4.22–5.81)
RDW: 13.3 % (ref 11.5–15.5)
WBC: 15.7 10*3/uL — ABNORMAL HIGH (ref 4.0–10.5)
nRBC: 0 % (ref 0.0–0.2)

## 2022-10-12 LAB — I-STAT CHEM 8, ED
BUN: 32 mg/dL — ABNORMAL HIGH (ref 6–20)
Calcium, Ion: 1.11 mmol/L — ABNORMAL LOW (ref 1.15–1.40)
Chloride: 98 mmol/L (ref 98–111)
Creatinine, Ser: 12.8 mg/dL — ABNORMAL HIGH (ref 0.61–1.24)
Glucose, Bld: 108 mg/dL — ABNORMAL HIGH (ref 70–99)
HCT: 33 % — ABNORMAL LOW (ref 39.0–52.0)
Hemoglobin: 11.2 g/dL — ABNORMAL LOW (ref 13.0–17.0)
Potassium: 4.1 mmol/L (ref 3.5–5.1)
Sodium: 139 mmol/L (ref 135–145)
TCO2: 26 mmol/L (ref 22–32)

## 2022-10-12 MED ORDER — MIDAZOLAM HCL 2 MG/2ML IJ SOLN
INTRAMUSCULAR | Status: AC
  Filled 2022-10-12: qty 4

## 2022-10-12 NOTE — ED Triage Notes (Signed)
[place] patient having hx of CVA, dialysis. Had witnessed full body seizure activity and then became unresponsive. Patient unresponsive on EMS arrival, moved to truck, and became combative. EMS unable to obtain vital signs.

## 2022-10-12 NOTE — Discharge Instructions (Signed)
[name] [name]  Thank you for allowing [place] to take care of you today.  You came to the Emergency Department today because the EMS brought you because you reportedly had a seizure [place] bus station [place] with some friends.  When you initially got here he was confused, however we were able To give you some medicine to calm you down, and check some labs.  Your heart rate was initially elevated however it is improved, your labs are consistent with your history of ESRD.  You report that you have seizures approximately every 3 months and your last was about 2 months ago.  We encourage you to continue to take your Lamictal and follow-up with your neurologist and primary care doctor.    To-Do: 1. Please follow-up with your primary doctor within 2 days / as soon as possible.    Please return to the Emergency Department or call 911 if you experience have worsening of your symptoms, or do not get better, sensation of tongue swelling, difficulty talking/eating/breathing because of tongue swelling/pain, chest pain, shortness of breath, severe or significantly worsening pain, high fever, severe confusion, pass out or have any reason to think that you need emergency medical care.   We hope you feel better soon.   Curley Spice, MD Department of Emergency Medicine [facility]

## 2022-10-12 NOTE — ED Provider Notes (Signed)
[place] EMERGENCY DEPARTMENT AT [facility] Provider Note  Medical Decision Making   HPI: [name] [name] is a 42 y.o. male with history perinent CVA, obesity, HTN, ESRD on HD MWF, seizure disorder on Lamictal who presents complaining of witnessed seizure-like activity. Patient arrived via EMS from scene.  History provided by EMS.  No interpreter required for this encounter.  Reports that they were called to the scene by a friend of the patient for witnessed seizure-like activity.  Reports that patient was reportedly eating supper when he had generalized tonic-clonic like activity.  Reports that patient was poorly responsive upon their arrival.  Reports that friend on scene reported that the patient had a history of ESRD, CVA, seizure disorder.  EMS moved the patient to the ambulance where patient thereafter became disoriented and combative.  They were unable to obtain vitals en route, no other intervention en route.  On initial exam, patient is disoriented, moving all extremities spontaneously, unable to provide history, keeps attempting to get up out of the bed, stating "I have to go"  ROS: As per HPI. Please see MAR for complete past medical history, surgical history, and social history.   Physical exam is pertinent for obesity, tachycardia, tachypnea, blood at the oropharynx with laceration to left lateral tongue.   The differential includes but is not limited to seizure, electrolyte derangement, infection.  Additional history obtained from: EMS External records from outside source obtained and reviewed including: Ultimately able to review patient's prior history which does include seizure disorder on Lamictal. Baseline hemoglobin 10-11, baseline platelets 50-100. Usually has anion gap of approximately 17  ED provider interpretation of ECG: Rate 133, sinus rhythm, no ST elevations or depressions, nonspecific T wave inversion in lead III.  ED provider interpretation of  radiology/imaging: None  Labs ordered were interpreted by myself as well as my attending and were incorporated into the medical decision making process for this patient.  ED provider interpretation of labs: CBC with nonspecific leukocytosis to 15.7 with left shift, anemia to 10.5, moderate thrombocytopenia  Interventions: Versed  See the EMR for full details regarding lab and imaging results.  On initial arrival, patient is disoriented, combative, attempting to get up out of bed, is not verbally redirectable, is unable to provide history, is unable to provide date of birth or name, patient does not have any identifying paperwork on his person, therefore unable to find chart to elucidate past medical history.  Given patient is not verbally redirectable, administered Versed IV for agitation, and labs obtained.  Did briefly required 2 L nasal cannula for brief hypoxia after administration of Versed to the upper 80s.  Overall, patient is initially significantly tachycardic to 140s, and has lacerations done, however otherwise no localizing symptoms on exam.  No other evidence of trauma on exam after patient is sedated with Versed.  Serial evaluations performed in the emergency department, patient became progressively more oriented.  On reevaluation patient is able to provide his name and date of birth, however no additional history.  Chart was able to be found, patient does have numerous lab derangements leukocytosis which is nonspecific given patient otherwise without localizing symptoms, afebrile, suspect that leukocytosis is likely from stress demargination in the setting of seizure.  Anemia and thrombocytopenia appears to be baseline upon comparison chart.  Elevated creatinine, BUN, anion gap elevation also appears to be baseline for the patient.  On further reevaluation after several hours in the ED patient was alert and oriented, states that he  feels like he is himself.  He denies any headache, neck  pain, chest pain, shortness of breath, nausea, vomiting, diarrhea, denies any recent sick symptoms.  Reports that he has been adherent to his home Lamictal, reports that his baseline seizure frequency is approximately once every 3 months, reports that his last prior seizure was approximately 2 months ago, reports that he does have establish care with a neurologist.  Patient reports that he has some soreness to his tongue, wound is hemostatic upon reevaluation, do not feel that patient requires wound repair at this time.  Patient has a stable airway.  Ports that he did attend his dialysis this morning of the 26.  Given patient is back to baseline, has no complaints, tachycardia has resolved, feel that patient is stable for discharge with outpatient follow-up with neurology, patient expresses understanding.  After patient discharge, notes that last SpO2 documented by nursing was 42%, on my numerous bedside evaluations patient had saturations in the 90s on room air.     Consults: Not indicated  Disposition: DISCHARGE: I believe that the patient is safe for discharge home with outpatient follow-up. Patient was informed of all pertinent physical exam, laboratory, and imaging findings. Patient's suspected etiology of their symptom presentation was discussed with the patient and all questions were answered. We discussed following up with PCP, neurology. I provided thorough ED return precautions. The patient feels safe and comfortable with this plan.  The plan for this patient was discussed with Dr. Anitra Lauth, who voiced agreement and who oversaw evaluation and treatment of this patient.  Clinical Impression:  1. Seizure-like activity [facility])    Discharge  Therapies: These medications and interventions were provided for the patient while in the ED. Medications  midazolam (VERSED) 2 MG/2ML injection (has no administration in time range)    MDM generated using voice dictation software and may contain  dictation errors.  Please contact me for any clarification or with any questions.  Clinical Complexity A medically appropriate history, review of systems, and physical exam was performed.  Collateral history obtained from: EMS I personally reviewed the labs, EKG, imaging as discussed above. Patient's presentation is most consistent with acute complicated illness / injury requiring diagnostic workup Considered and ruled out life and body threatening conditions  Treatment: Outpatient follow-up Patient's usual unknown identity and inability to provide history increases the complexity of managing their presentation. Medications: Parenteral controlled substances Discussed patient's care with providers from the following different specialties: None  Physical Exam   ED Triage Vitals  Enc Vitals Group     BP 10/12/22 1848 (!) 160/82     Pulse Rate 10/12/22 1846 (!) 139     Resp 10/12/22 1846 (!) 28     Temp 10/12/22 1849 (!) 97.1 F (36.2 C)     Temp Source 10/12/22 1849 Temporal     SpO2 10/12/22 1846 92 %     Weight --      Height --      Head Circumference --      Peak Flow --      Pain Score 10/12/22 1845 0     Pain Loc --      Pain Edu? --      Excl. in GC? --      Physical Exam Vitals and nursing note reviewed.  Constitutional:      General: He is not in acute distress.    Appearance: He is well-developed. He is obese.  HENT:     Head: Normocephalic  and atraumatic.     Comments: Laceration to left lateral tongue Eyes:     Extraocular Movements: Extraocular movements intact.     Conjunctiva/sclera: Conjunctivae normal.     Pupils: Pupils are equal, round, and reactive to light.  Cardiovascular:     Rate and Rhythm: Regular rhythm. Tachycardia present.     Heart sounds: No murmur heard. Pulmonary:     Effort: Pulmonary effort is normal. No respiratory distress.     Breath sounds: Normal breath sounds.  Abdominal:     Palpations: Abdomen is soft.     Tenderness: There  is no abdominal tenderness.  Musculoskeletal:        General: No swelling.     Cervical back: Neck supple.  Skin:    General: Skin is warm and dry.     Capillary Refill: Capillary refill takes less than 2 seconds.  Neurological:     Mental Status: He is alert. He is disoriented.  Psychiatric:        Mood and Affect: Mood normal.       Procedure Note  Procedures  No orders to display    Julianne Rice, MD Emergency Medicine, PGY-2   Curley Spice, MD 10/12/22 5366    Gwyneth Sprout, MD 10/13/22 1610

## 2022-10-15 ENCOUNTER — Encounter (HOSPITAL_COMMUNITY): Payer: Self-pay

## 2022-10-15 ENCOUNTER — Encounter: Payer: Self-pay | Admitting: Nurse Practitioner

## 2022-10-29 ENCOUNTER — Telehealth: Payer: Self-pay | Admitting: Nurse Practitioner

## 2022-10-29 NOTE — Telephone Encounter (Signed)
Copied from CRM [id number]. Topic: Medicare AWV >> Oct 29, 2022  2:58 PM Rushie Goltz wrote: Reason for CRM: Called patient to schedule Medicare Annual Wellness Visit (AWV). No voicemail available to leave a message.  Last date of AWV: AWVI eligible as of 05/18/22  Please schedule an AWVI appointment at any time with [facility] VISIT.  If any questions, please contact me at [phone].   Thank you,  [facility] Support [facility] Direct dial  [phone]

## 2022-11-08 ENCOUNTER — Other Ambulatory Visit: Payer: Self-pay

## 2022-11-08 ENCOUNTER — Emergency Department (HOSPITAL_COMMUNITY)
Admission: EM | Admit: 2022-11-08 | Discharge: 2022-11-08 | Disposition: A | Discharge status: Home / Self Care | Payer: 59 | Attending: Student | Admitting: Student

## 2022-11-08 ENCOUNTER — Encounter (HOSPITAL_COMMUNITY): Payer: Self-pay

## 2022-11-08 ENCOUNTER — Emergency Department (HOSPITAL_COMMUNITY): Payer: 59

## 2022-11-08 DIAGNOSIS — Z7901 Long term (current) use of anticoagulants: Secondary | ICD-10-CM | POA: Diagnosis not present

## 2022-11-08 DIAGNOSIS — K4020 Bilateral inguinal hernia, without obstruction or gangrene, not specified as recurrent: Secondary | ICD-10-CM | POA: Insufficient documentation

## 2022-11-08 DIAGNOSIS — Z79899 Other long term (current) drug therapy: Secondary | ICD-10-CM | POA: Insufficient documentation

## 2022-11-08 DIAGNOSIS — Y92410 Unspecified street and highway as the place of occurrence of the external cause: Secondary | ICD-10-CM | POA: Insufficient documentation

## 2022-11-08 DIAGNOSIS — Z8673 Personal history of transient ischemic attack (TIA), and cerebral infarction without residual deficits: Secondary | ICD-10-CM | POA: Insufficient documentation

## 2022-11-08 DIAGNOSIS — Z992 Dependence on renal dialysis: Secondary | ICD-10-CM | POA: Insufficient documentation

## 2022-11-08 DIAGNOSIS — R9431 Abnormal electrocardiogram [ECG] [EKG]: Secondary | ICD-10-CM | POA: Diagnosis not present

## 2022-11-08 DIAGNOSIS — R609 Edema, unspecified: Secondary | ICD-10-CM | POA: Diagnosis not present

## 2022-11-08 DIAGNOSIS — N186 End stage renal disease: Secondary | ICD-10-CM | POA: Insufficient documentation

## 2022-11-08 DIAGNOSIS — S01512A Laceration without foreign body of oral cavity, initial encounter: Secondary | ICD-10-CM | POA: Diagnosis not present

## 2022-11-08 DIAGNOSIS — R590 Localized enlarged lymph nodes: Secondary | ICD-10-CM | POA: Insufficient documentation

## 2022-11-08 DIAGNOSIS — R569 Unspecified convulsions: Secondary | ICD-10-CM | POA: Insufficient documentation

## 2022-11-08 DIAGNOSIS — I120 Hypertensive chronic kidney disease with stage 5 chronic kidney disease or end stage renal disease: Secondary | ICD-10-CM | POA: Insufficient documentation

## 2022-11-08 DIAGNOSIS — S00502A Unspecified superficial injury of oral cavity, initial encounter: Secondary | ICD-10-CM | POA: Diagnosis present

## 2022-11-08 LAB — COMPREHENSIVE METABOLIC PANEL
ALT: 13 U/L (ref 0–44)
AST: 24 U/L (ref 15–41)
Albumin: 3.4 g/dL — ABNORMAL LOW (ref 3.5–5.0)
Alkaline Phosphatase: 90 U/L (ref 38–126)
Anion gap: 16 — ABNORMAL HIGH (ref 5–15)
BUN: 23 mg/dL — ABNORMAL HIGH (ref 6–20)
CO2: 25 mmol/L (ref 22–32)
Calcium: 8.2 mg/dL — ABNORMAL LOW (ref 8.9–10.3)
Chloride: 95 mmol/L — ABNORMAL LOW (ref 98–111)
Creatinine, Ser: 7.65 mg/dL — ABNORMAL HIGH (ref 0.61–1.24)
GFR, Estimated: 8 mL/min — ABNORMAL LOW (ref >60)
Glucose, Bld: 127 mg/dL — ABNORMAL HIGH (ref 70–99)
Potassium: 4.1 mmol/L (ref 3.5–5.1)
Sodium: 136 mmol/L (ref 135–145)
Total Bilirubin: 1.2 mg/dL (ref 0.3–1.2)
Total Protein: 7.8 g/dL (ref 6.5–8.1)

## 2022-11-08 LAB — CBC
HCT: 30 % — ABNORMAL LOW (ref 39.0–52.0)
Hemoglobin: 9.2 g/dL — ABNORMAL LOW (ref 13.0–17.0)
MCH: 31 pg (ref 26.0–34.0)
MCHC: 30.7 g/dL (ref 30.0–36.0)
MCV: 101 fL — ABNORMAL HIGH (ref 80.0–100.0)
Platelets: 65 10*3/uL — ABNORMAL LOW (ref 150–400)
RBC: 2.97 MIL/uL — ABNORMAL LOW (ref 4.22–5.81)
RDW: 14.3 % (ref 11.5–15.5)
WBC: 7 10*3/uL (ref 4.0–10.5)
nRBC: 0 % (ref 0.0–0.2)

## 2022-11-08 LAB — I-STAT CHEM 8, ED
BUN: 28 mg/dL — ABNORMAL HIGH (ref 6–20)
Calcium, Ion: 0.91 mmol/L — ABNORMAL LOW (ref 1.15–1.40)
Chloride: 97 mmol/L — ABNORMAL LOW (ref 98–111)
Creatinine, Ser: 8.3 mg/dL — ABNORMAL HIGH (ref 0.61–1.24)
Glucose, Bld: 126 mg/dL — ABNORMAL HIGH (ref 70–99)
HCT: 29 % — ABNORMAL LOW (ref 39.0–52.0)
Hemoglobin: 9.9 g/dL — ABNORMAL LOW (ref 13.0–17.0)
Potassium: 4.1 mmol/L (ref 3.5–5.1)
Sodium: 138 mmol/L (ref 135–145)
TCO2: 29 mmol/L (ref 22–32)

## 2022-11-08 LAB — ETHANOL: Alcohol, Ethyl (B): <10 mg/dL (ref <10)

## 2022-11-08 LAB — LACTIC ACID, PLASMA: Lactic Acid, Venous: 3.7 mmol/L (ref 0.5–1.9)

## 2022-11-08 MED ORDER — LEVETIRACETAM IN NACL 1500 MG/100ML IV SOLN
1500.0000 mg | Freq: Once | INTRAVENOUS | Status: AC
  Administered 2022-11-08: 1500 mg via INTRAVENOUS
  Filled 2022-11-08: qty 100
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
## 2022-11-08 NOTE — ED Notes (Signed)
McCauley, PA notified of Lactic Acid 3.7.

## 2022-11-09 LAB — CBG MONITORING, ED: Glucose-Capillary: 122 mg/dL — ABNORMAL HIGH (ref 70–99)

## 2022-11-09 LAB — LEVETIRACETAM LEVEL: Levetiracetam Lvl: <2 ug/mL — ABNORMAL LOW (ref 10.0–40.0)

## 2022-12-28 ENCOUNTER — Emergency Department (HOSPITAL_COMMUNITY): Payer: 59

## 2022-12-28 ENCOUNTER — Emergency Department (HOSPITAL_COMMUNITY)
Admission: EM | Admit: 2022-12-28 | Discharge: 2022-12-28 | Disposition: A | Discharge status: Home / Self Care | Payer: 59 | Attending: Emergency Medicine | Admitting: Emergency Medicine

## 2022-12-28 ENCOUNTER — Other Ambulatory Visit: Payer: Self-pay

## 2022-12-28 ENCOUNTER — Emergency Department (HOSPITAL_COMMUNITY)
Admission: EM | Admit: 2022-12-28 | Discharge: 2022-12-29 | Disposition: A | Discharge status: Home / Self Care | Payer: 59 | Attending: Emergency Medicine | Admitting: Emergency Medicine

## 2022-12-28 ENCOUNTER — Encounter (HOSPITAL_COMMUNITY): Payer: Self-pay

## 2022-12-28 DIAGNOSIS — Z79899 Other long term (current) drug therapy: Secondary | ICD-10-CM | POA: Insufficient documentation

## 2022-12-28 DIAGNOSIS — Z992 Dependence on renal dialysis: Secondary | ICD-10-CM | POA: Insufficient documentation

## 2022-12-28 DIAGNOSIS — Z8673 Personal history of transient ischemic attack (TIA), and cerebral infarction without residual deficits: Secondary | ICD-10-CM | POA: Insufficient documentation

## 2022-12-28 DIAGNOSIS — R569 Unspecified convulsions: Secondary | ICD-10-CM | POA: Diagnosis present

## 2022-12-28 DIAGNOSIS — Z7901 Long term (current) use of anticoagulants: Secondary | ICD-10-CM | POA: Insufficient documentation

## 2022-12-28 DIAGNOSIS — N186 End stage renal disease: Secondary | ICD-10-CM | POA: Insufficient documentation

## 2022-12-28 DIAGNOSIS — N189 Chronic kidney disease, unspecified: Secondary | ICD-10-CM | POA: Insufficient documentation

## 2022-12-28 DIAGNOSIS — I120 Hypertensive chronic kidney disease with stage 5 chronic kidney disease or end stage renal disease: Secondary | ICD-10-CM | POA: Insufficient documentation

## 2022-12-28 DIAGNOSIS — G40909 Epilepsy, unspecified, not intractable, without status epilepticus: Secondary | ICD-10-CM | POA: Insufficient documentation

## 2022-12-28 DIAGNOSIS — M3214 Glomerular disease in systemic lupus erythematosus: Secondary | ICD-10-CM | POA: Insufficient documentation

## 2022-12-28 DIAGNOSIS — R55 Syncope and collapse: Secondary | ICD-10-CM | POA: Insufficient documentation

## 2022-12-28 DIAGNOSIS — R4182 Altered mental status, unspecified: Secondary | ICD-10-CM | POA: Insufficient documentation

## 2022-12-28 DIAGNOSIS — Z760 Encounter for issue of repeat prescription: Secondary | ICD-10-CM | POA: Insufficient documentation

## 2022-12-28 DIAGNOSIS — I129 Hypertensive chronic kidney disease with stage 1 through stage 4 chronic kidney disease, or unspecified chronic kidney disease: Secondary | ICD-10-CM | POA: Insufficient documentation

## 2022-12-28 LAB — CBC WITH DIFFERENTIAL/PLATELET
Abs Immature Granulocytes: 0.01 10*3/uL (ref 0.00–0.07)
Abs Immature Granulocytes: 0.01 10*3/uL (ref 0.00–0.07)
Basophils Absolute: 0 10*3/uL (ref 0.0–0.1)
Basophils Absolute: 0 10*3/uL (ref 0.0–0.1)
Basophils Relative: 0 %
Basophils Relative: 1 %
Eosinophils Absolute: 0.1 10*3/uL (ref 0.0–0.5)
Eosinophils Absolute: 0.1 10*3/uL (ref 0.0–0.5)
Eosinophils Relative: 1 %
Eosinophils Relative: 2 %
HCT: 39.2 % (ref 39.0–52.0)
HCT: 36.6 % — ABNORMAL LOW (ref 39.0–52.0)
Hemoglobin: 12.3 g/dL — ABNORMAL LOW (ref 13.0–17.0)
Hemoglobin: 11.7 g/dL — ABNORMAL LOW (ref 13.0–17.0)
Immature Granulocytes: 0 %
Immature Granulocytes: 0 %
Lymphocytes Relative: 8 %
Lymphocytes Relative: 11 %
Lymphs Abs: 0.6 10*3/uL — ABNORMAL LOW (ref 0.7–4.0)
Lymphs Abs: 0.7 10*3/uL (ref 0.7–4.0)
MCH: 30.8 pg (ref 26.0–34.0)
MCH: 30.9 pg (ref 26.0–34.0)
MCHC: 31.4 g/dL (ref 30.0–36.0)
MCHC: 32 g/dL (ref 30.0–36.0)
MCV: 98.2 fL (ref 80.0–100.0)
MCV: 96.6 fL (ref 80.0–100.0)
Monocytes Absolute: 0.5 10*3/uL (ref 0.1–1.0)
Monocytes Absolute: 0.6 10*3/uL (ref 0.1–1.0)
Monocytes Relative: 7 %
Monocytes Relative: 10 %
Neutro Abs: 5.7 10*3/uL (ref 1.7–7.7)
Neutro Abs: 5.1 10*3/uL (ref 1.7–7.7)
Neutrophils Relative %: 84 %
Neutrophils Relative %: 76 %
Platelets: 57 10*3/uL — ABNORMAL LOW (ref 150–400)
Platelets: 54 10*3/uL — ABNORMAL LOW (ref 150–400)
RBC: 3.99 MIL/uL — ABNORMAL LOW (ref 4.22–5.81)
RBC: 3.79 MIL/uL — ABNORMAL LOW (ref 4.22–5.81)
RDW: 16.2 % — ABNORMAL HIGH (ref 11.5–15.5)
RDW: 16.1 % — ABNORMAL HIGH (ref 11.5–15.5)
WBC: 6.9 10*3/uL (ref 4.0–10.5)
WBC: 6.7 10*3/uL (ref 4.0–10.5)
nRBC: 0 % (ref 0.0–0.2)
nRBC: 0 % (ref 0.0–0.2)

## 2022-12-28 LAB — COMPREHENSIVE METABOLIC PANEL
ALT: 12 U/L (ref 0–44)
ALT: 12 U/L (ref 0–44)
AST: 19 U/L (ref 15–41)
AST: 19 U/L (ref 15–41)
Albumin: 4 g/dL (ref 3.5–5.0)
Albumin: 3.8 g/dL (ref 3.5–5.0)
Alkaline Phosphatase: 88 U/L (ref 38–126)
Alkaline Phosphatase: 77 U/L (ref 38–126)
Anion gap: 11 (ref 5–15)
Anion gap: 20 — ABNORMAL HIGH (ref 5–15)
BUN: 22 mg/dL — ABNORMAL HIGH (ref 6–20)
BUN: 24 mg/dL — ABNORMAL HIGH (ref 6–20)
CO2: 31 mmol/L (ref 22–32)
CO2: 26 mmol/L (ref 22–32)
Calcium: 9.1 mg/dL (ref 8.9–10.3)
Calcium: 9.7 mg/dL (ref 8.9–10.3)
Chloride: 95 mmol/L — ABNORMAL LOW (ref 98–111)
Chloride: 93 mmol/L — ABNORMAL LOW (ref 98–111)
Creatinine, Ser: 8.48 mg/dL — ABNORMAL HIGH (ref 0.61–1.24)
Creatinine, Ser: 9.72 mg/dL — ABNORMAL HIGH (ref 0.61–1.24)
GFR, Estimated: 7 mL/min — ABNORMAL LOW (ref >60)
GFR, Estimated: 6 mL/min — ABNORMAL LOW (ref >60)
Glucose, Bld: 94 mg/dL (ref 70–99)
Glucose, Bld: 93 mg/dL (ref 70–99)
Potassium: 4.1 mmol/L (ref 3.5–5.1)
Potassium: 3.9 mmol/L (ref 3.5–5.1)
Sodium: 137 mmol/L (ref 135–145)
Sodium: 139 mmol/L (ref 135–145)
Total Bilirubin: 0.6 mg/dL (ref 0.3–1.2)
Total Bilirubin: 0.8 mg/dL (ref 0.3–1.2)
Total Protein: 8.5 g/dL — ABNORMAL HIGH (ref 6.5–8.1)
Total Protein: 7.7 g/dL (ref 6.5–8.1)

## 2022-12-28 LAB — CBG MONITORING, ED: Glucose-Capillary: 96 mg/dL (ref 70–99)

## 2022-12-28 LAB — LACTIC ACID, PLASMA: Lactic Acid, Venous: 1.4 mmol/L (ref 0.5–1.9)

## 2022-12-28 LAB — MAGNESIUM: Magnesium: 2.1 mg/dL (ref 1.7–2.4)

## 2022-12-28 MED ORDER — LEVETIRACETAM IN NACL 1000 MG/100ML IV SOLN: 1000.0000 mg | Freq: Once | INTRAVENOUS | Status: DC

## 2022-12-28 MED ORDER — LEVETIRACETAM 1000 MG PO TABS: 1000.0000 mg | ORAL_TABLET | Freq: Every day | ORAL | 0 refills | Status: DC

## 2022-12-28 MED ORDER — LORAZEPAM 2 MG/ML IJ SOLN
INTRAMUSCULAR | Status: AC
  Filled 2022-12-28: qty 1

## 2022-12-28 MED ORDER — LEVETIRACETAM IN NACL 1500 MG/100ML IV SOLN
1500.0000 mg | Freq: Once | INTRAVENOUS | Status: AC
  Administered 2022-12-28: 1500 mg via INTRAVENOUS
  Filled 2022-12-28: qty 100

## 2022-12-28 NOTE — Discharge Instructions (Addendum)
I have prescribed medication to help with your seizure, please take 1 tablet daily for the next 30 days, you will need to follow-up with primary care physician in order to obtain this refill.  If you experience any worsening symptoms please return to the emergency department.

## 2022-12-28 NOTE — ED Provider Notes (Signed)
[place] EMERGENCY DEPARTMENT AT [facility] Provider Note   CSN: [id number] Arrival date & time: 12/28/22  1343     History CKD, Stroke, Seizures Chief Complaint  Patient presents with   Seizures    [name] [name] is a 42 y.o. male.  42 y.o male with a PMH of hypertension, CKD, stroke, seizures presents to the ED with a chief complaint of seizure.  Patient was currently at the dialysis center where he received dialysis Monday, Wednesday Friday, reports receiving a full session today.  He did have a seizure-like activity while at dialysis today, therefore was sent here for further treatment.  Patient reports having a beer the other day, he is a very poor historian and does not provide much detail.  But he reports loss of consciousness today after his seizure, he feels somewhat sleepy afterwards.  He last took his Keppra maybe 2 days ago?,  He is asking for refill as he is run out.  He has not had any fevers, no sick contacts, does not have any other complaints.  The history is provided by the patient and medical records.  Seizures      Home Medications Prior to Admission medications   Medication Sig Start Date End Date Taking? Authorizing Provider  levETIRAcetam (KEPPRA) 1000 MG tablet Take 1 tablet (1,000 mg total) by mouth daily. 12/28/22 01/27/23 Yes Trenyce Loera, PA-C  amLODipine (NORVASC) 10 MG tablet Take 1 tablet (10 mg total) by mouth at bedtime. For blood pressure 08/22/22   Claiborne Rigg, NP  carvedilol (COREG) 12.5 MG tablet Take 1 tablet (12.5 mg total) by mouth 2 (two) times daily with a meal. DOSE CHANGE 08/21/22   Claiborne Rigg, NP  cinacalcet (SENSIPAR) 90 MG tablet Take 90 mg by mouth daily. 06/01/22   [provider]  ELIQUIS 2.5 MG TABS tablet TAKE 1 TABLET(2.5 MG) BY MOUTH TWICE DAILY 08/29/22   Georgian Co M, PA-C  rosuvastatin (CRESTOR) 40 MG tablet Take 40 mg by mouth daily. 06/03/22   [provider]      Allergies     Zyrtec [cetirizine]    Review of Systems   Review of Systems  Constitutional:  Negative for chills and fever.  Respiratory:  Negative for shortness of breath.   Cardiovascular:  Negative for chest pain.  Gastrointestinal:  Negative for abdominal pain.  Neurological:  Positive for seizures.  All other systems reviewed and are negative.   Physical Exam Updated Vital Signs BP (!) 141/113 (BP Location: Left Arm)   Pulse 65   Temp 98.3 F (36.8 C) (Oral)   Resp 20   Ht 6\' 2"  (1.88 m)   Wt 108 kg   SpO2 100%   BMI 30.57 kg/m  Physical Exam Vitals and nursing note reviewed.  Constitutional:      Appearance: Normal appearance.  HENT:     Head: Normocephalic and atraumatic.     Mouth/Throat:     Mouth: Mucous membranes are moist.     Comments: No signs of trauma to his mouth or tongue.  Cardiovascular:     Rate and Rhythm: Normal rate.  Pulmonary:     Effort: Pulmonary effort is normal.     Breath sounds: No wheezing or rales.  Abdominal:     General: Abdomen is flat.  Musculoskeletal:     Cervical back: Normal range of motion and neck supple.  Skin:    General: Skin is warm and dry.  Neurological:  Mental Status: He is alert and oriented to person, place, and time.     Comments: Alert, oriented, thought content appropriate. Speech fluent without evidence of aphasia. Able to follow 2 step commands without difficulty.  Cranial Nerves:  II:  Peripheral visual fields grossly normal, pupils, round, reactive to light III,IV, VI: ptosis not present, extra-ocular motions intact bilaterally  V,VII: smile symmetric, facial light touch sensation equal VIII: hearing grossly normal bilaterally  IX,X: midline uvula rise  XI: bilateral shoulder shrug equal and strong XII: midline tongue extension  Motor:  5/5 in upper and lower extremities bilaterally including strong and equal grip strength and dorsiflexion/plantar flexion Sensory: light touch normal in all extremities.   Cerebellar: normal finger-to-nose with bilateral upper extremities, pronator drift negative Gait: N/A       ED Results / Procedures / Treatments   Labs (all labs ordered are listed, but only abnormal results are displayed) Labs Reviewed  CBC WITH DIFFERENTIAL/PLATELET - Abnormal; Notable for the following components:      Result Value   RBC 3.99 (*)    Hemoglobin 12.3 (*)    RDW 16.2 (*)    Platelets 57 (*)    Lymphs Abs 0.6 (*)    All other components within normal limits  COMPREHENSIVE METABOLIC PANEL - Abnormal; Notable for the following components:   Chloride 95 (*)    BUN 22 (*)    Creatinine, Ser 8.48 (*)    Total Protein 8.5 (*)    GFR, Estimated 7 (*)    All other components within normal limits  LEVETIRACETAM LEVEL    EKG EKG Interpretation Date/Time:  Friday December 28 2022 13:51:40 EDT Ventricular Rate:  67 PR Interval:  185 QRS Duration:  101 QT Interval:  440 QTC Calculation: 465 R Axis:   -12  Text Interpretation: Sinus rhythm Left atrial enlargement RSR' in V1 or V2, right VCD or RVH ST elev, probable normal early repol pattern when compared to prior, overall similar appearance. No STEMI Confirmed by Theda Belfast ([id number]) on 12/28/2022 4:44:15 PM  Radiology DG Chest Portable 1 View  Result Date: 12/28/2022 CLINICAL DATA:  seizure EXAM: PORTABLE CHEST 1 VIEW COMPARISON:  11/08/2022. FINDINGS: Bilateral lung fields are clear. Bilateral costophrenic angles are clear. Stable cardio-mediastinal silhouette. No acute osseous abnormalities. The soft tissues are within normal limits. Right IJ hemodialysis catheter noted with its tip overlying the right atrium, unchanged. IMPRESSION: 1. No active disease. Electronically Signed   By: Jules Schick M.D.   On: 12/28/2022 15:07    Procedures Procedures    Medications Ordered in ED Medications  levETIRAcetam (KEPPRA) IVPB 1500 mg/ 100 mL premix (1,500 mg Intravenous New Bag/Given 12/28/22 1457)    ED Course/  Medical Decision Making/ A&P                             Medical Decision Making Amount and/or Complexity of Data Reviewed Labs: ordered. Radiology: ordered.  Risk Prescription drug management.    This patient presents to the ED for concern of seizure, this involves a number of treatment options, and is a complaint that carries with it a high risk of complications and morbidity.  The differential diagnosis includes infection, medication non compliance,  versus substance use.    Co morbidities: Discussed in HPI   Brief History:  See HPI.   EMR reviewed including pt PMHx, past surgical history and past visits to ER.   See HPI for  more details   Lab Tests:  I ordered and independently interpreted labs.  The pertinent results include:    Labs notable for CBC with no leukocytosis, hemoglobin is at his baseline.  CMP with no electrolyte derangement, creatinine is elevated but is at his baseline.  LFTs are within normal limits.  Keppra level is pending at this time.   Imaging Studies:  NAD. I personally reviewed all imaging studies and no acute abnormality found. I agree with radiology interpretation.   Cardiac Monitoring:  The patient was maintained on a cardiac monitor.  I personally viewed and interpreted the cardiac monitored which showed an underlying rhythm of: NSR 67 EKG non-ischemic   Medicines ordered:  I ordered medication including keppra  for seizure  Reevaluation of the patient after these medicines showed that the patient improved I have reviewed the patients home medicines and have made adjustments as needed  Reevaluation:  After the interventions noted above I re-evaluated patient and found that they have :improved  Social Determinants of Health:  The patient's social determinants of health were a factor in the care of this patient   Problem List / ED Course:  Patient presents to the ED status post seizure after dialysis.  Currently on Keppra  1000 mg daily but reports she has not taken it over 2 days ago, he reports receiving his full dialysis dose today.  He is not having any infectious sources, denies any fever, cough, abdominal pain, nausea or vomiting.  His labs are within his baseline.  His creatinine level is unchanged from prior to this but his full dose of dialysis, potassium is within normal limits.  He feels better after receiving Keppra and has not had any seizure-like activity in the past 4 hours while in the emergency department, we discussed medication compliance, will refill his Keppra at this time return precautions discussed at length.  Patient hemodynamically stable for discharge.  Dispostion:  After consideration of the diagnostic results and the patients response to treatment, I feel that the patent would benefit from follow up with primary care physician.    Portions of this note were generated with Scientist, clinical (histocompatibility and immunogenetics). Dictation errors may occur despite best attempts at proofreading.   Final Clinical Impression(s) / ED Diagnoses Final diagnoses:  Seizure [facility])    Rx / DC Orders ED Discharge Orders          Ordered    levETIRAcetam (KEPPRA) 1000 MG tablet  Daily        12/28/22 1755              Claude Manges, PA-C 12/28/22 1803    Tegeler, Canary Brim, MD 12/29/22 1504

## 2022-12-28 NOTE — ED Notes (Signed)
Seizure pads in place

## 2022-12-28 NOTE — ED Triage Notes (Signed)
Pt BIB EMS from dialysis. Pt noted to have 2 episodes of seizure like activity after dialysis.

## 2022-12-28 NOTE — ED Triage Notes (Signed)
Pt arrived via [facility]. Call to EMS for pt found next to road in grass face down. PT remains confused about what happened and why he is here. Oriented to person, place, and time. Pt appears to have short memory deficit at triage. Denies any pain. Hx of sz. Pt is also dialysis.

## 2022-12-28 NOTE — ED Provider Notes (Signed)
[place] EMERGENCY DEPARTMENT AT [facility] Provider Note   CSN: [id number] Arrival date & time: 12/28/22  2104     History {Add pertinent medical, surgical, social history, OB history to HPI:1} Chief Complaint  Patient presents with   Altered Mental Status    [name] [name] is a 42 y.o. male.  42 year old male with a history of seizures, lupus, hypertension, ESRD (M/W/F dialysis) presents to the emergency department via EMS.  EMS was called as patient was found next to the road in the grass facedown.  Unclear mechanism that caused patient to end up in the grass.  He is unable to expand on this as well.  It is unclear whether he may have been drinking.  Denies use of illicit drugs.  He was seen earlier today at [facility] after experiencing seizure-like activity while at his dialysis session.  He was loaded with Keppra during this ED encounter.  He does not believe that he experienced a seizure following his last ED discharge.  Denies any pain including headache, chest pain, shortness of breath, abdominal pain.  The history is provided by the patient. No language interpreter was used.  Altered Mental Status      Home Medications Prior to Admission medications   Medication Sig Start Date End Date Taking? Authorizing Provider  amLODipine (NORVASC) 10 MG tablet Take 1 tablet (10 mg total) by mouth at bedtime. For blood pressure 08/22/22   Claiborne Rigg, NP  carvedilol (COREG) 12.5 MG tablet Take 1 tablet (12.5 mg total) by mouth 2 (two) times daily with a meal. DOSE CHANGE 08/21/22   Claiborne Rigg, NP  cinacalcet (SENSIPAR) 90 MG tablet Take 90 mg by mouth daily. 06/01/22   [provider]  ELIQUIS 2.5 MG TABS tablet TAKE 1 TABLET(2.5 MG) BY MOUTH TWICE DAILY 08/29/22   Georgian Co M, PA-C  levETIRAcetam (KEPPRA) 1000 MG tablet Take 1 tablet (1,000 mg total) by mouth daily. 12/28/22 01/27/23  Claude Manges, PA-C  rosuvastatin (CRESTOR) 40 MG tablet Take 40 mg by  mouth daily. 06/03/22   [provider]      Allergies    Zyrtec [cetirizine]    Review of Systems   Review of Systems Ten systems reviewed and are negative for acute change, except as noted in the HPI.    Physical Exam Updated Vital Signs BP (!) 140/93   Pulse 80   Temp 98.1 F (36.7 C) (Oral)   Resp 16   SpO2 93%   Physical Exam Vitals and nursing note reviewed.  Constitutional:      General: He is not in acute distress.    Appearance: He is well-developed. He is not diaphoretic.     Comments: Sleeping on arrival to room. Easily awoken. No acute distress, nontoxic.  HENT:     Head: Normocephalic and atraumatic.  Eyes:     General: No scleral icterus.    Conjunctiva/sclera: Conjunctivae normal.  Pulmonary:     Effort: Pulmonary effort is normal. No respiratory distress.     Breath sounds: No wheezing.     Comments: Respirations even and unlabored Musculoskeletal:        General: Normal range of motion.     Cervical back: Normal range of motion.  Skin:    General: Skin is warm and dry.     Coloration: Skin is not pale.     Findings: No erythema or rash.  Neurological:     Mental Status: He is alert  and oriented to person, place, and time.     Coordination: Coordination normal.     Comments: GCS 15. Alert and oriented to person, place, time. Moving all extremities spontaneously. No focal deficits noted.  Psychiatric:        Behavior: Behavior normal.     ED Results / Procedures / Treatments   Labs (all labs ordered are listed, but only abnormal results are displayed) Labs Reviewed  CBC WITH DIFFERENTIAL/PLATELET - Abnormal; Notable for the following components:      Result Value   RBC 3.79 (*)    Hemoglobin 11.7 (*)    HCT 36.6 (*)    RDW 16.1 (*)    Platelets 54 (*)    All other components within normal limits  COMPREHENSIVE METABOLIC PANEL - Abnormal; Notable for the following components:   Chloride 93 (*)    BUN 24 (*)    Creatinine, Ser  9.72 (*)    GFR, Estimated 6 (*)    Anion gap 20 (*)    All other components within normal limits  LACTIC ACID, PLASMA  MAGNESIUM  LACTIC ACID, PLASMA  URINALYSIS, ROUTINE W REFLEX MICROSCOPIC  RAPID URINE DRUG SCREEN, HOSP PERFORMED  ETHANOL  CBG MONITORING, ED    EKG None  Radiology DG Chest Portable 1 View  Result Date: 12/28/2022 CLINICAL DATA:  seizure EXAM: PORTABLE CHEST 1 VIEW COMPARISON:  11/08/2022. FINDINGS: Bilateral lung fields are clear. Bilateral costophrenic angles are clear. Stable cardio-mediastinal silhouette. No acute osseous abnormalities. The soft tissues are within normal limits. Right IJ hemodialysis catheter noted with its tip overlying the right atrium, unchanged. IMPRESSION: 1. No active disease. Electronically Signed   By: Jules Schick M.D.   On: 12/28/2022 15:07    Procedures Procedures  {Document cardiac monitor, telemetry assessment procedure when appropriate:1}  Medications Ordered in ED Medications  LORazepam (ATIVAN) 2 MG/ML injection (has no administration in time range)    ED Course/ Medical Decision Making/ A&P   {   Click here for ABCD2, HEART and other calculatorsREFRESH Note before signing :1}                          Medical Decision Making Amount and/or Complexity of Data Reviewed Labs: ordered. Radiology: ordered.   ***  {Document critical care time when appropriate:1} {Document review of labs and clinical decision tools ie heart score, Chads2Vasc2 etc:1}  {Document your independent review of radiology images, and any outside records:1} {Document your discussion with family members, caretakers, and with consultants:1} {Document social determinants of health affecting pt's care:1} {Document your decision making why or why not admission, treatments were needed:1} Final Clinical Impression(s) / ED Diagnoses Final diagnoses:  None    Rx / DC Orders ED Discharge Orders     None

## 2022-12-28 NOTE — ED Notes (Addendum)
Pt resting.

## 2022-12-29 ENCOUNTER — Emergency Department (HOSPITAL_COMMUNITY): Payer: 59

## 2022-12-29 DIAGNOSIS — G40909 Epilepsy, unspecified, not intractable, without status epilepticus: Secondary | ICD-10-CM | POA: Diagnosis not present

## 2022-12-29 LAB — LACTIC ACID, PLASMA: Lactic Acid, Venous: 1.5 mmol/L (ref 0.5–1.9)

## 2022-12-29 LAB — ETHANOL: Alcohol, Ethyl (B): <10 mg/dL (ref <10)

## 2022-12-29 NOTE — Discharge Instructions (Signed)
Take Keppra as prescribed and follow-up with your primary care doctor.  Continue your dialysis sessions as scheduled.  You may return for new or concerning symptoms.

## 2022-12-29 NOTE — ED Notes (Signed)
Pt transported to CT ?

## 2022-12-31 LAB — LEVETIRACETAM LEVEL: Levetiracetam Lvl: <2 ug/mL — ABNORMAL LOW (ref 10.0–40.0)

## 2023-01-02 ENCOUNTER — Encounter (HOSPITAL_COMMUNITY): Payer: Self-pay

## 2023-01-02 ENCOUNTER — Other Ambulatory Visit: Payer: Self-pay

## 2023-01-02 ENCOUNTER — Emergency Department (HOSPITAL_COMMUNITY)
Admission: EM | Admit: 2023-01-02 | Discharge: 2023-01-03 | Disposition: A | Discharge status: Home / Self Care | Payer: 59 | Attending: Emergency Medicine | Admitting: Emergency Medicine

## 2023-01-02 ENCOUNTER — Emergency Department (HOSPITAL_COMMUNITY): Payer: 59

## 2023-01-02 DIAGNOSIS — Z8673 Personal history of transient ischemic attack (TIA), and cerebral infarction without residual deficits: Secondary | ICD-10-CM | POA: Diagnosis not present

## 2023-01-02 DIAGNOSIS — N186 End stage renal disease: Secondary | ICD-10-CM | POA: Insufficient documentation

## 2023-01-02 DIAGNOSIS — Z79899 Other long term (current) drug therapy: Secondary | ICD-10-CM | POA: Insufficient documentation

## 2023-01-02 DIAGNOSIS — Z992 Dependence on renal dialysis: Secondary | ICD-10-CM | POA: Diagnosis not present

## 2023-01-02 DIAGNOSIS — Z7901 Long term (current) use of anticoagulants: Secondary | ICD-10-CM | POA: Insufficient documentation

## 2023-01-02 DIAGNOSIS — R55 Syncope and collapse: Secondary | ICD-10-CM | POA: Insufficient documentation

## 2023-01-02 DIAGNOSIS — R569 Unspecified convulsions: Secondary | ICD-10-CM

## 2023-01-02 LAB — CBC
HCT: 34.6 % — ABNORMAL LOW (ref 39.0–52.0)
Hemoglobin: 11 g/dL — ABNORMAL LOW (ref 13.0–17.0)
MCH: 31.3 pg (ref 26.0–34.0)
MCHC: 31.8 g/dL (ref 30.0–36.0)
MCV: 98.6 fL (ref 80.0–100.0)
Platelets: 70 10*3/uL — ABNORMAL LOW (ref 150–400)
RBC: 3.51 MIL/uL — ABNORMAL LOW (ref 4.22–5.81)
RDW: 15.7 % — ABNORMAL HIGH (ref 11.5–15.5)
WBC: 5.1 10*3/uL (ref 4.0–10.5)
nRBC: 0 % (ref 0.0–0.2)

## 2023-01-02 LAB — COMPREHENSIVE METABOLIC PANEL
ALT: 11 U/L (ref 0–44)
AST: 17 U/L (ref 15–41)
Albumin: 3.3 g/dL — ABNORMAL LOW (ref 3.5–5.0)
Alkaline Phosphatase: 89 U/L (ref 38–126)
Anion gap: 13 (ref 5–15)
BUN: 28 mg/dL — ABNORMAL HIGH (ref 6–20)
CO2: 30 mmol/L (ref 22–32)
Calcium: 9.6 mg/dL (ref 8.9–10.3)
Chloride: 97 mmol/L — ABNORMAL LOW (ref 98–111)
Creatinine, Ser: 9.9 mg/dL — ABNORMAL HIGH (ref 0.61–1.24)
GFR, Estimated: 6 mL/min — ABNORMAL LOW (ref >60)
Glucose, Bld: 113 mg/dL — ABNORMAL HIGH (ref 70–99)
Potassium: 3.5 mmol/L (ref 3.5–5.1)
Sodium: 140 mmol/L (ref 135–145)
Total Bilirubin: 0.5 mg/dL (ref 0.3–1.2)
Total Protein: 6.9 g/dL (ref 6.5–8.1)

## 2023-01-02 LAB — ETHANOL: Alcohol, Ethyl (B): <10 mg/dL (ref <10)

## 2023-01-02 LAB — MAGNESIUM: Magnesium: 2.2 mg/dL (ref 1.7–2.4)

## 2023-01-02 MED ORDER — LEVETIRACETAM IN NACL 1500 MG/100ML IV SOLN
1500.0000 mg | Freq: Once | INTRAVENOUS | Status: AC
  Administered 2023-01-02: 1500 mg via INTRAVENOUS
  Filled 2023-01-02: qty 100

## 2023-01-02 NOTE — ED Triage Notes (Addendum)
Patient BIB [facility] from side of road with complaint of possible seizure or possible syncope.   Per EMS patient is postictal.  PMHX: Dialysis M-W-F Seizures  Upon arrival patient A & O x 3. Reports that he has been compliant with seizure medication, had full dialysis treatment today, and has not eaten today either.   Patient does not remember event, denies pain.

## 2023-01-02 NOTE — Discharge Instructions (Addendum)
Evaluation today revealed that you likely had a seizure. Recommend you follow-up with neurology. Contact information for [place] [facility] is in your discharge summary.  If you have another seizure, passout, chest pain, shortness of breath, headache, facial droop, slurred speech or weakness or numbness in your extremities or any other concerning symptom please return emerged part for further evaluation.

## 2023-01-02 NOTE — ED Notes (Signed)
Requested ordered medication from pharmacy 

## 2023-01-02 NOTE — ED Provider Notes (Addendum)
[place] EMERGENCY DEPARTMENT AT [facility] Provider Note   CSN: [id number] Arrival date & time: 01/02/23  1912     History  Chief Complaint  Patient presents with   Near Syncope   Seizures   HPI [name] [name] is a 42 y.o. male with history of seizures, ESRD, and CVA presenting for near syncope versus seizures.  Was brought in by EMS and found on the side of the road and EMS reports that he was in what appeared to be a postictal phase.  Patient states that all he remembers today is that he went to dialysis and then he woke up here in the emergency department.  States that in the last 6 months he has had several seizures. Does endorse bed noncompliance with states since he refilled his Keppra last week that he has been taking as it is prescribed. Denies alcohol use.  Denies fever and infectious symptoms.  Denies chest pain or shortness of breath.   Near Syncope  Seizures      Home Medications Prior to Admission medications   Medication Sig Start Date End Date Taking? Authorizing Provider  amLODipine (NORVASC) 10 MG tablet Take 1 tablet (10 mg total) by mouth at bedtime. For blood pressure 08/22/22   Claiborne Rigg, NP  carvedilol (COREG) 12.5 MG tablet Take 1 tablet (12.5 mg total) by mouth 2 (two) times daily with a meal. DOSE CHANGE 08/21/22   Claiborne Rigg, NP  cinacalcet (SENSIPAR) 90 MG tablet Take 90 mg by mouth daily. 06/01/22   [provider]  ELIQUIS 2.5 MG TABS tablet TAKE 1 TABLET(2.5 MG) BY MOUTH TWICE DAILY 08/29/22   Georgian Co M, PA-C  levETIRAcetam (KEPPRA) 1000 MG tablet Take 1 tablet (1,000 mg total) by mouth daily. 12/28/22 01/27/23  Claude Manges, PA-C  rosuvastatin (CRESTOR) 40 MG tablet Take 40 mg by mouth daily. 06/03/22   [provider]      Allergies    Zyrtec [cetirizine]    Review of Systems   Review of Systems  Cardiovascular:  Positive for near-syncope.  Neurological:  Positive for seizures.    Physical Exam    Vitals:   01/02/23 1917  BP: (!) 137/94  Pulse: 98  Resp: 16  Temp: 98.2 F (36.8 C)  SpO2: 96%    CONSTITUTIONAL:  well-appearing, NAD NEURO:  GCS 15. Speech is goal oriented. No deficits appreciated to CN III-XII; symmetric eyebrow raise, no facial drooping, tongue midline. Patient has equal grip strength bilaterally with 5/5 strength against resistance in all major muscle groups bilaterally. Sensation to light touch intact. Patient moves extremities without ataxia. Normal finger-nose-finger. Patient ambulatory with steady gait. EYES:  eyes equal and reactive ENT/NECK:  Supple, no stridor, no tongue biting CARDIO: Regular rate and rhythm, appears well-perfused  PULM:  No respiratory distress, CTAB GI/GU:  non-distended, soft MSK/SPINE:  No gross deformities, no edema, moves all extremities  SKIN:  no rash, atraumatic  *Additional and/or pertinent findings included in MDM below  ED Results / Procedures / Treatments   Labs (all labs ordered are listed, but only abnormal results are displayed) Labs Reviewed  CBC - Abnormal; Notable for the following components:      Result Value   RBC 3.51 (*)    Hemoglobin 11.0 (*)    HCT 34.6 (*)    RDW 15.7 (*)    Platelets 70 (*)    All other components within normal limits  COMPREHENSIVE METABOLIC PANEL - Abnormal; Notable  for the following components:   Chloride 97 (*)    Glucose, Bld 113 (*)    BUN 28 (*)    Creatinine, Ser 9.90 (*)    Albumin 3.3 (*)    GFR, Estimated 6 (*)    All other components within normal limits  MAGNESIUM  ETHANOL  URINALYSIS, ROUTINE W REFLEX MICROSCOPIC  RAPID URINE DRUG SCREEN, HOSP PERFORMED  LEVETIRACETAM LEVEL  CBG MONITORING, ED    EKG None  Radiology CT Head Wo Contrast  Result Date: 01/02/2023 CLINICAL DATA:  Mental status change with unknown cause. Syncope or presyncope with cerebrovascular cause suspected. Possible seizure. EXAM: CT HEAD WITHOUT CONTRAST TECHNIQUE: Contiguous axial  images were obtained from the base of the skull through the vertex without intravenous contrast. RADIATION DOSE REDUCTION: This exam was performed according to the departmental dose-optimization program which includes automated exposure control, adjustment of the mA and/or kV according to patient size and/or use of iterative reconstruction technique. COMPARISON:  12/29/2022 FINDINGS: Brain: Diffuse cerebral atrophy. Ventricular dilatation consistent with central atrophy. Low-attenuation changes in the deep white matter consistent with small vessel ischemia. No abnormal extra-axial fluid collections. No mass effect or midline shift. Gray-white matter junctions are distinct. Basal cisterns are not effaced. No acute intracranial hemorrhage. Old appearing encephalomalacia in the left parietal lobe consistent with old infarct. No change. Vascular: No hyperdense vessel or unexpected calcification. Skull: Normal. Negative for fracture or focal lesion. Sinuses/Orbits: No acute finding. Other: None. IMPRESSION: No acute intracranial abnormalities. Chronic atrophy and small vessel ischemic changes with old left parietal infarct similar to prior study. Electronically Signed   By: Burman Nieves M.D.   On: 01/02/2023 21:23   DG Chest 1 View  Result Date: 01/02/2023 CLINICAL DATA:  Fall seizure EXAM: CHEST  1 VIEW COMPARISON:  12/28/2022 FINDINGS: Right-sided central venous catheter tip at the right atrium. Mild cardiomegaly. No acute airspace disease or effusion. No pneumothorax IMPRESSION: Mild cardiomegaly. Electronically Signed   By: Jasmine Pang M.D.   On: 01/02/2023 20:26    Procedures Procedures    Medications Ordered in ED Medications  levETIRAcetam (KEPPRA) IVPB 1500 mg/ 100 mL premix (1,500 mg Intravenous New Bag/Given 01/02/23 2228)    ED Course/ Medical Decision Making/ A&P Clinical Course as of 01/02/23 2349  Wed Jan 02, 2023  2335 ALT: 11 [JR]    Clinical Course User Index [JR] Gareth Eagle, PA-C                             Medical Decision Making Amount and/or Complexity of Data Reviewed Labs: ordered. Decision-making details documented in ED Course. Radiology: ordered.  Risk Prescription drug management.   Initial Impression and Ddx 42 yo well-appearing male presenting for seizure vs syncope.  Exam was unremarkable.  DDx includes seizure, syncope, PE, electrolyte derangement. Patient PMH that increases complexity of ED encounter:  seizures, ESRD, and CVA   Interpretation of Diagnostics - I independent reviewed and interpreted the labs as followed: anemia  - I independently visualized the following imaging with scope of interpretation limited to determining acute life threatening conditions related to emergency care: CT head, which revealed no acute findings  - I personally reviewed and interpreted EKG which revealed normal sinus rhythm  Patient Reassessment and Ultimate Disposition/Management On serial assessments, patient remained clinically well, in no acute distress and hemoglobin stable.  After workup, suspect breakthrough seizure.  Workup does not suggest infection. Loaded with Keppra.  Advised to follow-up with neurology.  Patient assured me that he is "plenty of Keppra".  Discussed pertinent return precautions.  Also discussed preventative measures for seizures at home Vital stable throughout encounter.  Discharged home.  Patient management required discussion with the following services or consulting groups:  None  Complexity of Problems Addressed Acute complicated illness or Injury  Additional Data Reviewed and Analyzed Further history obtained from: Past medical history and medications listed in the EMR, Prior ED visit notes, and Recent discharge summary  Patient Encounter Risk Assessment None   Final Clinical Impression(s) / ED Diagnoses Final diagnoses:  Seizures [facility])    Rx / DC Orders ED Discharge Orders     None         Gareth Eagle, PA-C 01/02/23 2357    Loetta Rough, MD 01/04/23 1156

## 2023-01-05 LAB — LEVETIRACETAM LEVEL: Levetiracetam Lvl: 10.5 ug/mL (ref 10.0–40.0)

## 2023-01-18 ENCOUNTER — Emergency Department (HOSPITAL_COMMUNITY): Payer: 59

## 2023-01-18 ENCOUNTER — Inpatient Hospital Stay (HOSPITAL_COMMUNITY)
Admission: EM | Admit: 2023-01-18 | Discharge: 2023-02-13 | DRG: 100 | Disposition: A | Discharge status: Home / Self Care | Payer: 59 | Attending: Internal Medicine | Admitting: Internal Medicine

## 2023-01-18 ENCOUNTER — Other Ambulatory Visit: Payer: Self-pay

## 2023-01-18 DIAGNOSIS — R569 Unspecified convulsions: Secondary | ICD-10-CM | POA: Diagnosis not present

## 2023-01-18 DIAGNOSIS — J9601 Acute respiratory failure with hypoxia: Secondary | ICD-10-CM | POA: Diagnosis not present

## 2023-01-18 DIAGNOSIS — R292 Abnormal reflex: Secondary | ICD-10-CM | POA: Diagnosis present

## 2023-01-18 DIAGNOSIS — J690 Pneumonitis due to inhalation of food and vomit: Secondary | ICD-10-CM | POA: Diagnosis present

## 2023-01-18 DIAGNOSIS — Z79899 Other long term (current) drug therapy: Secondary | ICD-10-CM

## 2023-01-18 DIAGNOSIS — E8779 Other fluid overload: Secondary | ICD-10-CM | POA: Diagnosis present

## 2023-01-18 DIAGNOSIS — M50022 Cervical disc disorder at C5-C6 level with myelopathy: Secondary | ICD-10-CM | POA: Diagnosis present

## 2023-01-18 DIAGNOSIS — E785 Hyperlipidemia, unspecified: Secondary | ICD-10-CM | POA: Diagnosis present

## 2023-01-18 DIAGNOSIS — H538 Other visual disturbances: Secondary | ICD-10-CM | POA: Diagnosis not present

## 2023-01-18 DIAGNOSIS — T426X6A Underdosing of other antiepileptic and sedative-hypnotic drugs, initial encounter: Secondary | ICD-10-CM | POA: Diagnosis present

## 2023-01-18 DIAGNOSIS — E874 Mixed disorder of acid-base balance: Secondary | ICD-10-CM | POA: Diagnosis present

## 2023-01-18 DIAGNOSIS — N2581 Secondary hyperparathyroidism of renal origin: Secondary | ICD-10-CM | POA: Diagnosis present

## 2023-01-18 DIAGNOSIS — G8321 Monoplegia of upper limb affecting right dominant side: Secondary | ICD-10-CM | POA: Diagnosis not present

## 2023-01-18 DIAGNOSIS — R270 Ataxia, unspecified: Secondary | ICD-10-CM | POA: Diagnosis not present

## 2023-01-18 DIAGNOSIS — M898X9 Other specified disorders of bone, unspecified site: Secondary | ICD-10-CM | POA: Diagnosis present

## 2023-01-18 DIAGNOSIS — D631 Anemia in chronic kidney disease: Secondary | ICD-10-CM | POA: Diagnosis present

## 2023-01-18 DIAGNOSIS — I953 Hypotension of hemodialysis: Secondary | ICD-10-CM | POA: Diagnosis not present

## 2023-01-18 DIAGNOSIS — M4712 Other spondylosis with myelopathy, cervical region: Secondary | ICD-10-CM | POA: Diagnosis present

## 2023-01-18 DIAGNOSIS — G4700 Insomnia, unspecified: Secondary | ICD-10-CM | POA: Diagnosis not present

## 2023-01-18 DIAGNOSIS — Z888 Allergy status to other drugs, medicaments and biological substances status: Secondary | ICD-10-CM

## 2023-01-18 DIAGNOSIS — T882XXA Shock due to anesthesia, initial encounter: Secondary | ICD-10-CM | POA: Diagnosis present

## 2023-01-18 DIAGNOSIS — D6862 Lupus anticoagulant syndrome: Secondary | ICD-10-CM | POA: Diagnosis present

## 2023-01-18 DIAGNOSIS — Z751 Person awaiting admission to adequate facility elsewhere: Secondary | ICD-10-CM

## 2023-01-18 DIAGNOSIS — M3214 Glomerular disease in systemic lupus erythematosus: Secondary | ICD-10-CM | POA: Diagnosis present

## 2023-01-18 DIAGNOSIS — I493 Ventricular premature depolarization: Secondary | ICD-10-CM | POA: Diagnosis not present

## 2023-01-18 DIAGNOSIS — T4145XA Adverse effect of unspecified anesthetic, initial encounter: Secondary | ICD-10-CM | POA: Diagnosis present

## 2023-01-18 DIAGNOSIS — M329 Systemic lupus erythematosus, unspecified: Secondary | ICD-10-CM | POA: Diagnosis present

## 2023-01-18 DIAGNOSIS — R1310 Dysphagia, unspecified: Secondary | ICD-10-CM | POA: Diagnosis not present

## 2023-01-18 DIAGNOSIS — R5381 Other malaise: Secondary | ICD-10-CM | POA: Diagnosis present

## 2023-01-18 DIAGNOSIS — D6959 Other secondary thrombocytopenia: Secondary | ICD-10-CM | POA: Diagnosis present

## 2023-01-18 DIAGNOSIS — Z992 Dependence on renal dialysis: Secondary | ICD-10-CM

## 2023-01-18 DIAGNOSIS — M4802 Spinal stenosis, cervical region: Secondary | ICD-10-CM | POA: Diagnosis present

## 2023-01-18 DIAGNOSIS — M25511 Pain in right shoulder: Secondary | ICD-10-CM | POA: Diagnosis not present

## 2023-01-18 DIAGNOSIS — R21 Rash and other nonspecific skin eruption: Secondary | ICD-10-CM | POA: Diagnosis not present

## 2023-01-18 DIAGNOSIS — I63432 Cerebral infarction due to embolism of left posterior cerebral artery: Secondary | ICD-10-CM | POA: Diagnosis not present

## 2023-01-18 DIAGNOSIS — H53461 Homonymous bilateral field defects, right side: Secondary | ICD-10-CM | POA: Diagnosis not present

## 2023-01-18 DIAGNOSIS — E669 Obesity, unspecified: Secondary | ICD-10-CM | POA: Diagnosis present

## 2023-01-18 DIAGNOSIS — Z8673 Personal history of transient ischemic attack (TIA), and cerebral infarction without residual deficits: Secondary | ICD-10-CM

## 2023-01-18 DIAGNOSIS — I63442 Cerebral infarction due to embolism of left cerebellar artery: Secondary | ICD-10-CM | POA: Diagnosis not present

## 2023-01-18 DIAGNOSIS — R58 Hemorrhage, not elsewhere classified: Secondary | ICD-10-CM | POA: Diagnosis present

## 2023-01-18 DIAGNOSIS — Z6833 Body mass index (BMI) 33.0-33.9, adult: Secondary | ICD-10-CM

## 2023-01-18 DIAGNOSIS — N186 End stage renal disease: Secondary | ICD-10-CM | POA: Diagnosis present

## 2023-01-18 DIAGNOSIS — E1143 Type 2 diabetes mellitus with diabetic autonomic (poly)neuropathy: Secondary | ICD-10-CM | POA: Diagnosis present

## 2023-01-18 DIAGNOSIS — Z91128 Patient's intentional underdosing of medication regimen for other reason: Secondary | ICD-10-CM

## 2023-01-18 DIAGNOSIS — G40909 Epilepsy, unspecified, not intractable, without status epilepticus: Secondary | ICD-10-CM | POA: Diagnosis not present

## 2023-01-18 DIAGNOSIS — G931 Anoxic brain damage, not elsewhere classified: Secondary | ICD-10-CM | POA: Diagnosis present

## 2023-01-18 DIAGNOSIS — I1311 Hypertensive heart and chronic kidney disease without heart failure, with stage 5 chronic kidney disease, or end stage renal disease: Secondary | ICD-10-CM | POA: Diagnosis present

## 2023-01-18 LAB — CBC WITH DIFFERENTIAL/PLATELET
Abs Immature Granulocytes: 0.02 10*3/uL (ref 0.00–0.07)
Basophils Absolute: 0.1 10*3/uL (ref 0.0–0.1)
Basophils Relative: 1 %
Eosinophils Absolute: 0.2 10*3/uL (ref 0.0–0.5)
Eosinophils Relative: 2 %
HCT: 34 % — ABNORMAL LOW (ref 39.0–52.0)
Hemoglobin: 10.9 g/dL — ABNORMAL LOW (ref 13.0–17.0)
Immature Granulocytes: 0 %
Lymphocytes Relative: 20 %
Lymphs Abs: 1.4 10*3/uL (ref 0.7–4.0)
MCH: 31.4 pg (ref 26.0–34.0)
MCHC: 32.1 g/dL (ref 30.0–36.0)
MCV: 98 fL (ref 80.0–100.0)
Monocytes Absolute: 0.9 10*3/uL (ref 0.1–1.0)
Monocytes Relative: 13 %
Neutro Abs: 4.4 10*3/uL (ref 1.7–7.7)
Neutrophils Relative %: 64 %
Platelets: 102 10*3/uL — ABNORMAL LOW (ref 150–400)
RBC: 3.47 MIL/uL — ABNORMAL LOW (ref 4.22–5.81)
RDW: 14.6 % (ref 11.5–15.5)
WBC: 6.8 10*3/uL (ref 4.0–10.5)
nRBC: 0.3 % — ABNORMAL HIGH (ref 0.0–0.2)

## 2023-01-18 LAB — COMPREHENSIVE METABOLIC PANEL
ALT: 17 U/L (ref 0–44)
AST: 27 U/L (ref 15–41)
Albumin: 3.5 g/dL (ref 3.5–5.0)
Alkaline Phosphatase: 89 U/L (ref 38–126)
Anion gap: 18 — ABNORMAL HIGH (ref 5–15)
BUN: 30 mg/dL — ABNORMAL HIGH (ref 6–20)
CO2: 23 mmol/L (ref 22–32)
Calcium: 9.2 mg/dL (ref 8.9–10.3)
Chloride: 95 mmol/L — ABNORMAL LOW (ref 98–111)
Creatinine, Ser: 10.29 mg/dL — ABNORMAL HIGH (ref 0.61–1.24)
GFR, Estimated: 6 mL/min — ABNORMAL LOW (ref >60)
Glucose, Bld: 97 mg/dL (ref 70–99)
Potassium: 4.7 mmol/L (ref 3.5–5.1)
Sodium: 136 mmol/L (ref 135–145)
Total Bilirubin: 0.9 mg/dL (ref 0.3–1.2)
Total Protein: 7.4 g/dL (ref 6.5–8.1)

## 2023-01-18 LAB — I-STAT CHEM 8, ED
BUN: 41 mg/dL — ABNORMAL HIGH (ref 6–20)
Calcium, Ion: 1.06 mmol/L — ABNORMAL LOW (ref 1.15–1.40)
Chloride: 97 mmol/L — ABNORMAL LOW (ref 98–111)
Creatinine, Ser: 11.3 mg/dL — ABNORMAL HIGH (ref 0.61–1.24)
Glucose, Bld: 95 mg/dL (ref 70–99)
HCT: 33 % — ABNORMAL LOW (ref 39.0–52.0)
Hemoglobin: 11.2 g/dL — ABNORMAL LOW (ref 13.0–17.0)
Potassium: 4.8 mmol/L (ref 3.5–5.1)
Sodium: 139 mmol/L (ref 135–145)
TCO2: 32 mmol/L (ref 22–32)

## 2023-01-18 LAB — I-STAT ARTERIAL BLOOD GAS, ED
Acid-base deficit: 10 mmol/L — ABNORMAL HIGH (ref 0.0–2.0)
Bicarbonate: 19.4 mmol/L — ABNORMAL LOW (ref 20.0–28.0)
Calcium, Ion: 1.2 mmol/L (ref 1.15–1.40)
HCT: 35 % — ABNORMAL LOW (ref 39.0–52.0)
Hemoglobin: 11.9 g/dL — ABNORMAL LOW (ref 13.0–17.0)
O2 Saturation: 89 %
Patient temperature: 99.5
Potassium: 3.8 mmol/L (ref 3.5–5.1)
Sodium: 139 mmol/L (ref 135–145)
TCO2: 21 mmol/L — ABNORMAL LOW (ref 22–32)
pCO2 arterial: 57.7 mmHg — ABNORMAL HIGH (ref 32–48)
pH, Arterial: 7.138 — CL (ref 7.35–7.45)
pO2, Arterial: 77 mmHg — ABNORMAL LOW (ref 83–108)

## 2023-01-18 LAB — I-STAT CG4 LACTIC ACID, ED: Lactic Acid, Venous: 6.3 mmol/L (ref 0.5–1.9)

## 2023-01-18 LAB — BRAIN NATRIURETIC PEPTIDE: B Natriuretic Peptide: 533.1 pg/mL — ABNORMAL HIGH (ref 0.0–100.0)

## 2023-01-18 LAB — CBG MONITORING, ED: Glucose-Capillary: 129 mg/dL — ABNORMAL HIGH (ref 70–99)

## 2023-01-18 MED ORDER — VANCOMYCIN HCL 2000 MG/400ML IV SOLN
2000.0000 mg | Freq: Once | INTRAVENOUS | Status: AC
  Administered 2023-01-19: 2000 mg via INTRAVENOUS
  Filled 2023-01-18: qty 400

## 2023-01-18 MED ORDER — LABETALOL HCL 5 MG/ML IV SOLN
10.0000 mg | Freq: Once | INTRAVENOUS | Status: AC
  Administered 2023-01-18: 10 mg via INTRAVENOUS
  Filled 2023-01-18: qty 4

## 2023-01-18 MED ORDER — SODIUM BICARBONATE 8.4 % IV SOLN
50.0000 meq | Freq: Once | INTRAVENOUS | Status: AC
  Administered 2023-01-18: 50 meq via INTRAVENOUS
  Filled 2023-01-18: qty 50

## 2023-01-18 MED ORDER — CHLORHEXIDINE GLUCONATE CLOTH 2 % EX PADS
6.0000 | MEDICATED_PAD | Freq: Every day | CUTANEOUS | Status: DC
  Administered 2023-01-19 – 2023-01-20 (×3): 6 via TOPICAL

## 2023-01-18 MED ORDER — LABETALOL HCL 5 MG/ML IV SOLN
20.0000 mg | Freq: Once | INTRAVENOUS | Status: AC
  Administered 2023-01-18: 20 mg via INTRAVENOUS
  Filled 2023-01-18: qty 4

## 2023-01-18 MED ORDER — LORAZEPAM 2 MG/ML IJ SOLN
INTRAMUSCULAR | Status: AC
  Administered 2023-01-18: 1 mg
  Filled 2023-01-18: qty 1

## 2023-01-18 MED ORDER — SODIUM CHLORIDE 0.9 % IV SOLN
2.0000 g | Freq: Once | INTRAVENOUS | Status: AC
  Administered 2023-01-18: 2 g via INTRAVENOUS
  Filled 2023-01-18: qty 12.5

## 2023-01-18 MED ORDER — SODIUM BICARBONATE 8.4 % IV SOLN: 50.0000 meq | Freq: Once | INTRAVENOUS | Status: AC

## 2023-01-18 MED ORDER — METRONIDAZOLE 500 MG/100ML IV SOLN
500.0000 mg | Freq: Once | INTRAVENOUS | Status: AC
  Administered 2023-01-18: 500 mg via INTRAVENOUS
  Filled 2023-01-18: qty 100

## 2023-01-18 MED ORDER — VANCOMYCIN HCL IN DEXTROSE 1-5 GM/200ML-% IV SOLN: 1000.0000 mg | Freq: Once | INTRAVENOUS | Status: DC

## 2023-01-18 MED ORDER — LEVETIRACETAM IN NACL 1000 MG/100ML IV SOLN
1000.0000 mg | Freq: Once | INTRAVENOUS | Status: AC
  Administered 2023-01-18: 1000 mg via INTRAVENOUS
  Filled 2023-01-18: qty 100

## 2023-01-18 MED ORDER — SODIUM BICARBONATE 8.4 % IV SOLN
INTRAVENOUS | Status: AC
  Administered 2023-01-19: 50 meq via INTRAVENOUS
  Filled 2023-01-18: qty 50

## 2023-01-18 NOTE — H&P (Signed)
NAME:  [name] [name], MRN:  [id number], DOB:  Apr 04, 1981, LOS: 0 ADMISSION DATE:  01/18/2023 CONSULTATION DATE:  01/18/2023 REFERRING MD:  Fredderick Phenix - EDP CHIEF COMPLAINT:  Seizure   History of Present Illness:  42 year old man who presented to [facility] ED 8/2 for AMS, seizure-likely activity. Unknown PMHx, per bystander report patient is diabetic.  Pertinent Medical History:  ?T2DM, ?CA  Significant Hospital Events: Including procedures, antibiotic start and stop dates in addition to other pertinent events     Interim History / Subjective:  PCCM consulted for ICU evaluation/admission.  Objective:  Blood pressure (!) 183/116, pulse 95, temperature 99.4 F (37.4 C), temperature source Axillary, resp. rate (!) 25, SpO2 99%.    FiO2 (%):  [100 %] 100 %  No intake or output data in the 24 hours ending 01/18/23 2300 There were no vitals filed for this visit.  Physical Examination: General: {SRACUITY:25313} ill-appearing *** in NAD. HEENT: [place]/AT, anicteric sclera, PERRL, moist mucous membranes. Neuro: {SRLOC:25308} {SRSTIMULI:25309} {SRCOMMANDS:25310} {SRNEUROEXTREMITIES:25312} Strength ***/5 in *** extremities. {SRRBRAINSTEM:25311}  CV: RRR, no m/g/r. PULM: Breathing even and unlabored on ***. Lung fields ***. GI: Soft, nontender, nondistended. Normoactive bowel sounds. Extremities: *** LE edema noted. Skin: Warm/dry, ***.  Resolved Hospital Problem List:    Assessment & Plan:  ***  Best Practice: (right click and "Reselect all SmartList Selections" daily)   Diet/type: {diet type:25684} DVT prophylaxis: {anticoagulation (Optional):25687} GI prophylaxis: {YQ:65784} Lines: {Central Venous Access:25771} Foley:  {Central Venous Access:25691} Code Status:  {Code Status:26939} Last date of multidisciplinary goals of care discussion [***]  Labs:  CBC: Recent Labs  Lab 01/18/23 2056 01/18/23 2131 01/18/23 2141  WBC 6.8  --   --   NEUTROABS 4.4  --   --   HGB 10.9* 11.2* 11.9*   HCT 34.0* 33.0* 35.0*  MCV 98.0  --   --   PLT 102*  --   --    Basic Metabolic Panel: Recent Labs  Lab 01/18/23 2056 01/18/23 2131 01/18/23 2141  NA 136 139 139  K 4.7 4.8 3.8  CL 95* 97*  --   CO2 23  --   --   GLUCOSE 97 95  --   BUN 30* 41*  --   CREATININE 10.29* 11.30*  --   CALCIUM 9.2  --   --    GFR: CrCl cannot be calculated (Unknown ideal weight.). Recent Labs  Lab 01/18/23 2056 01/18/23 2228  WBC 6.8  --   LATICACIDVEN  --  6.3*   Liver Function Tests: Recent Labs  Lab 01/18/23 2056  AST 27  ALT 17  ALKPHOS 89  BILITOT 0.9  PROT 7.4  ALBUMIN 3.5   No results for input(s): "LIPASE", "AMYLASE" in the last 168 hours. No results for input(s): "AMMONIA" in the last 168 hours.  ABG:    Component Value Date/Time   PHART 7.138 (LL) 01/18/2023 2141   PCO2ART 57.7 (H) 01/18/2023 2141   PO2ART 77 (L) 01/18/2023 2141   HCO3 19.4 (L) 01/18/2023 2141   TCO2 21 (L) 01/18/2023 2141   ACIDBASEDEF 10.0 (H) 01/18/2023 2141   O2SAT 89 01/18/2023 2141    Coagulation Profile: No results for input(s): "INR", "PROTIME" in the last 168 hours.  Cardiac Enzymes: No results for input(s): "CKTOTAL", "CKMB", "CKMBINDEX", "TROPONINI" in the last 168 hours.  HbA1C: No results found for: "HGBA1C"  CBG: Recent Labs  Lab 01/18/23 2129  GLUCAP 129*   Review of Systems:   ***  Past Medical History:  He,  has no past medical history on file.   Surgical History:  *** The histories are not reviewed yet. Please review them in the "History" navigator section and refresh this SmartLink.   Social History:      Family History:  His family history is not on file.   Allergies: Not on File   Home Medications: Prior to Admission medications   Not on File    Critical care time:   The patient is critically ill with multiple organ system failure and requires high complexity decision making for assessment and support, frequent evaluation and titration of  therapies, advanced monitoring, review of radiographic studies and interpretation of complex data.   Critical Care Time devoted to patient care services, exclusive of separately billable procedures, described in this note is *** minutes.  Tim Lair, PA-C [facility] 01/18/23 11:00 PM  Please see [email or web address] for pager details.  From 7A-7P if no response, please call [phone] After hours, please call ELink [phone]

## 2023-01-18 NOTE — ED Provider Notes (Signed)
[place] EMERGENCY DEPARTMENT AT [facility] Provider Note   CSN: [id number] Arrival date & time: 01/18/23  2029     History  Chief Complaint  Patient presents with   Seizures    [name] [name] is a 42 y.o. male.  Patient is an unknown aged male who presents after possible seizure.  He was found at a [employer].  He reportedly had a witnessed seizure by bystanders.  He did not have any seizure activity in EMS care but was altered.  He is a little bit more awake now and says that he does have a history of seizures.  He also says he is a dialysis patient.  He cannot tell me when his last dialysis was.  He says he is not taking his current seizure medication but does not know what it is.  He denies any injuries.  He is noted to be hypoxic but denies any shortness of breath.  He comes in as a Counsellor.  No further history is known.       Home Medications Prior to Admission medications   Not on File      Allergies    Patient has no allergy information on record.    Review of Systems   Review of Systems  Unable to perform ROS: Mental status change    Physical Exam Updated Vital Signs BP (!) 183/116   Pulse 95   Temp 99.4 F (37.4 C) (Axillary)   Resp (!) 25   SpO2 99%  Physical Exam Constitutional:      Appearance: He is well-developed.     Comments: Sleepy but will wake up and answer questions  HENT:     Head: Normocephalic and atraumatic.     Nose: No congestion or rhinorrhea.     Mouth/Throat:     Comments: Small abrasion on his tongue Eyes:     Pupils: Pupils are equal, round, and reactive to light.  Cardiovascular:     Rate and Rhythm: Normal rate and regular rhythm.     Heart sounds: Normal heart sounds.  Pulmonary:     Effort: Pulmonary effort is normal. No respiratory distress.     Breath sounds: Normal breath sounds. No wheezing or rales.     Comments: Rhonchi bilaterally Chest:     Chest wall: No tenderness.  Abdominal:     General: Bowel  sounds are normal.     Palpations: Abdomen is soft.     Tenderness: There is no abdominal tenderness. There is no guarding or rebound.  Musculoskeletal:        General: Normal range of motion.     Cervical back: Normal range of motion and neck supple.  Lymphadenopathy:     Cervical: No cervical adenopathy.  Skin:    General: Skin is warm and dry.     Findings: No rash.  Neurological:     General: No focal deficit present.     Mental Status: He is oriented to person, place, and time.     ED Results / Procedures / Treatments   Labs (all labs ordered are listed, but only abnormal results are displayed) Labs Reviewed  CBC WITH DIFFERENTIAL/PLATELET - Abnormal; Notable for the following components:      Result Value   RBC 3.47 (*)    Hemoglobin 10.9 (*)    HCT 34.0 (*)    Platelets 102 (*)    nRBC 0.3 (*)    All other components within normal limits  COMPREHENSIVE METABOLIC PANEL - Abnormal; Notable for the following components:   Chloride 95 (*)    BUN 30 (*)    Creatinine, Ser 10.29 (*)    GFR, Estimated 6 (*)    Anion gap 18 (*)    All other components within normal limits  BRAIN NATRIURETIC PEPTIDE - Abnormal; Notable for the following components:   B Natriuretic Peptide 533.1 (*)    All other components within normal limits  I-STAT CHEM 8, ED - Abnormal; Notable for the following components:   Chloride 97 (*)    BUN 41 (*)    Creatinine, Ser 11.30 (*)    Calcium, Ion 1.06 (*)    Hemoglobin 11.2 (*)    HCT 33.0 (*)    All other components within normal limits  CBG MONITORING, ED - Abnormal; Notable for the following components:   Glucose-Capillary 129 (*)    All other components within normal limits  I-STAT ARTERIAL BLOOD GAS, ED - Abnormal; Notable for the following components:   pH, Arterial 7.138 (*)    pCO2 arterial 57.7 (*)    pO2, Arterial 77 (*)    Bicarbonate 19.4 (*)    TCO2 21 (*)    Acid-base deficit 10.0 (*)    HCT 35.0 (*)    Hemoglobin 11.9 (*)     All other components within normal limits  I-STAT CG4 LACTIC ACID, ED - Abnormal; Notable for the following components:   Lactic Acid, Venous 6.3 (*)    All other components within normal limits  CULTURE, BLOOD (ROUTINE X 2)  CULTURE, BLOOD (ROUTINE X 2)  RAPID URINE DRUG SCREEN, HOSP PERFORMED  ETHANOL  BLOOD GAS, ARTERIAL  URINALYSIS, W/ REFLEX TO CULTURE (INFECTION SUSPECTED)  HEPATITIS B SURFACE ANTIGEN  HEPATITIS B SURFACE ANTIBODY, QUANTITATIVE    EKG None  Radiology DG Chest Port 1 View  Result Date: 01/18/2023 CLINICAL DATA:  Hypoxia EXAM: PORTABLE CHEST 1 VIEW COMPARISON:  None available. FINDINGS: Right dialysis catheter tip in the right atrium. Cardiomegaly, vascular congestion and mild diffuse airspace disease, likely edema. No effusions or acute bony abnormality. IMPRESSION: Cardiomegaly, mild pulmonary edema. Electronically Signed   By: Charlett Nose M.D.   On: 01/18/2023 21:20    Procedures Procedures    Medications Ordered in ED Medications  metroNIDAZOLE (FLAGYL) IVPB 500 mg (has no administration in time range)  vancomycin (VANCOREADY) IVPB 2000 mg/400 mL (has no administration in time range)  Chlorhexidine Gluconate Cloth 2 % PADS 6 each (has no administration in time range)  levETIRAcetam (KEPPRA) IVPB 1000 mg/100 mL premix (0 mg Intravenous Stopped 01/18/23 2155)  LORazepam (ATIVAN) 2 MG/ML injection (1 mg  Given 01/18/23 2223)  ceFEPIme (MAXIPIME) 2 g in sodium chloride 0.9 % 100 mL IVPB (2 g Intravenous New Bag/Given 01/18/23 2232)  labetalol (NORMODYNE) injection 10 mg (10 mg Intravenous Given 01/18/23 2243)  sodium bicarbonate injection 50 mEq (50 mEq Intravenous Given 01/18/23 2251)    ED Course/ Medical Decision Making/ A&P                                 Medical Decision Making Amount and/or Complexity of Data Reviewed Labs: ordered. Radiology: ordered.  Risk Prescription drug management. Decision regarding hospitalization.   Patient is a  42 year old male who presents after a seizure.  He was postictal on arrival but did start waking up and would answer questions appropriately.  We were able to  find out his history and he is a dialysis patient although it is unclear when his last dialysis was.  Apparently he is noncompliant with his Keppra.  He was noted to be hypoxic and requiring nonrebreather mask.  However with the nonrebreather, he was oxygenating at 94 to 95%.  He was loaded with IV Keppra.  Just as his Keppra was finishing, he had a second seizure.  He bit his tongue.  He had some bleeding in his oropharynx which was suctioned.  His oxygen levels dropped down again into the 60s.  Initially the decision was made to intubate the patient.  However he did start waking back up.  Initially was placed on oxygen and he came up to the 80s on nonrebreather.  He started to wake up more and was answering questions.  The bleeding in his oropharynx stopped.  At this point he appears to be awake enough to try BiPAP.  He was placed on BiPAP.  Chest x-ray shows evidence of edema.  No discrete area of pneumonia noted.  This was interpreted by me and confirmed by the radiologist.  His ABG shows acidosis with a pH of 7.1.  I feel that is likely more metabolic rather than respiratory and that his pCO2 is not very high.  I do not see a clear indication of infection and his temperature rectally was normal.  However I did go ahead and start him on antibiotics for potential sepsis.  He was given an amp of sodium bicarb.  I consulted with nephrology who plans to dialyze the patient tonight.  I consulted with the intensivist who will admit the patient for further treatment.  He currently is still awake and alert.  He seems to be doing well on the BiPAP.  I was able to look at his tongue and he has a laceration to the right lateral aspect of the tongue which should heal without repair.  CRITICAL CARE Performed by: Rolan Bucco Total critical care time: 80  minutes Critical care time was exclusive of separately billable procedures and treating other patients. Critical care was necessary to treat or prevent imminent or life-threatening deterioration. Critical care was time spent personally by me on the following activities: development of treatment plan with patient and/or surrogate as well as nursing, discussions with consultants, evaluation of patient's response to treatment, examination of patient, obtaining history from patient or surrogate, ordering and performing treatments and interventions, ordering and review of laboratory studies, ordering and review of radiographic studies, pulse oximetry and re-evaluation of patient's condition.   Final Clinical Impression(s) / ED Diagnoses Final diagnoses:  Seizure (HCC)  Acute respiratory failure with hypoxia [facility])    Rx / DC Orders ED Discharge Orders     None         Rolan Bucco, MD 01/18/23 2307

## 2023-01-18 NOTE — ED Triage Notes (Signed)
CBG 120, no vitals, no IV obtained.   Pt has hx of cancer and is "very diabetic" per by standards.  They said he had a seizure and is currently likely post ictal until proven otherwise. He is agitated and trying to escape.

## 2023-01-18 NOTE — Progress Notes (Signed)
ED Pharmacy Antibiotic Sign Off An antibiotic consult was received from an ED provider for cefepime per pharmacy dosing for sepsis. A chart review was completed to assess appropriateness.   Per RN, pt too altered to tell us height and weight but visual estimation is ~6'2" and 250lbs.   The following one time order(s) were placed:  Cefepime 2g x1   Further antibiotic and/or antibiotic pharmacy consults should be ordered by the admitting provider if indicated.   Thank you for allowing pharmacy to be a part of this patient's care.   Leander Rams, [facility]  Clinical Pharmacist 01/18/23 10:20 PM

## 2023-01-19 ENCOUNTER — Inpatient Hospital Stay (HOSPITAL_COMMUNITY): Payer: 59

## 2023-01-19 ENCOUNTER — Encounter: Payer: Self-pay | Admitting: Pulmonary Disease

## 2023-01-19 ENCOUNTER — Encounter (HOSPITAL_COMMUNITY): Payer: Self-pay | Admitting: Internal Medicine

## 2023-01-19 DIAGNOSIS — J9601 Acute respiratory failure with hypoxia: Secondary | ICD-10-CM

## 2023-01-19 DIAGNOSIS — R569 Unspecified convulsions: Secondary | ICD-10-CM

## 2023-01-19 DIAGNOSIS — R0603 Acute respiratory distress: Secondary | ICD-10-CM | POA: Diagnosis not present

## 2023-01-19 DIAGNOSIS — G4700 Insomnia, unspecified: Secondary | ICD-10-CM | POA: Diagnosis not present

## 2023-01-19 DIAGNOSIS — M329 Systemic lupus erythematosus, unspecified: Secondary | ICD-10-CM | POA: Diagnosis present

## 2023-01-19 DIAGNOSIS — I63442 Cerebral infarction due to embolism of left cerebellar artery: Secondary | ICD-10-CM | POA: Diagnosis not present

## 2023-01-19 DIAGNOSIS — N186 End stage renal disease: Secondary | ICD-10-CM | POA: Diagnosis present

## 2023-01-19 DIAGNOSIS — M50022 Cervical disc disorder at C5-C6 level with myelopathy: Secondary | ICD-10-CM | POA: Diagnosis present

## 2023-01-19 DIAGNOSIS — N2581 Secondary hyperparathyroidism of renal origin: Secondary | ICD-10-CM | POA: Diagnosis present

## 2023-01-19 DIAGNOSIS — E669 Obesity, unspecified: Secondary | ICD-10-CM | POA: Diagnosis present

## 2023-01-19 DIAGNOSIS — G931 Anoxic brain damage, not elsewhere classified: Secondary | ICD-10-CM | POA: Diagnosis present

## 2023-01-19 DIAGNOSIS — I63432 Cerebral infarction due to embolism of left posterior cerebral artery: Secondary | ICD-10-CM | POA: Diagnosis not present

## 2023-01-19 DIAGNOSIS — M4712 Other spondylosis with myelopathy, cervical region: Secondary | ICD-10-CM | POA: Diagnosis present

## 2023-01-19 DIAGNOSIS — Z992 Dependence on renal dialysis: Secondary | ICD-10-CM | POA: Diagnosis not present

## 2023-01-19 DIAGNOSIS — D6862 Lupus anticoagulant syndrome: Secondary | ICD-10-CM | POA: Diagnosis present

## 2023-01-19 DIAGNOSIS — E785 Hyperlipidemia, unspecified: Secondary | ICD-10-CM | POA: Diagnosis present

## 2023-01-19 DIAGNOSIS — E874 Mixed disorder of acid-base balance: Secondary | ICD-10-CM | POA: Diagnosis present

## 2023-01-19 DIAGNOSIS — E1143 Type 2 diabetes mellitus with diabetic autonomic (poly)neuropathy: Secondary | ICD-10-CM | POA: Diagnosis present

## 2023-01-19 DIAGNOSIS — I1311 Hypertensive heart and chronic kidney disease without heart failure, with stage 5 chronic kidney disease, or end stage renal disease: Secondary | ICD-10-CM | POA: Diagnosis present

## 2023-01-19 DIAGNOSIS — J690 Pneumonitis due to inhalation of food and vomit: Secondary | ICD-10-CM | POA: Diagnosis present

## 2023-01-19 DIAGNOSIS — M3214 Glomerular disease in systemic lupus erythematosus: Secondary | ICD-10-CM | POA: Diagnosis present

## 2023-01-19 DIAGNOSIS — I639 Cerebral infarction, unspecified: Secondary | ICD-10-CM | POA: Diagnosis not present

## 2023-01-19 DIAGNOSIS — T882XXA Shock due to anesthesia, initial encounter: Secondary | ICD-10-CM | POA: Diagnosis present

## 2023-01-19 DIAGNOSIS — D631 Anemia in chronic kidney disease: Secondary | ICD-10-CM | POA: Diagnosis present

## 2023-01-19 DIAGNOSIS — G40909 Epilepsy, unspecified, not intractable, without status epilepticus: Secondary | ICD-10-CM | POA: Diagnosis present

## 2023-01-19 DIAGNOSIS — D6959 Other secondary thrombocytopenia: Secondary | ICD-10-CM | POA: Diagnosis present

## 2023-01-19 DIAGNOSIS — G8321 Monoplegia of upper limb affecting right dominant side: Secondary | ICD-10-CM | POA: Diagnosis not present

## 2023-01-19 LAB — ECHOCARDIOGRAM COMPLETE
AR max vel: 2.98 cm2
AV Area VTI: 3.53 cm2
AV Area mean vel: 3.16 cm2
AV Mean grad: 7 mmHg
AV Peak grad: 13.7 mmHg
Ao pk vel: 1.85 m/s
Area-P 1/2: 1.9 cm2
Height: 73 in
MV M vel: 5.34 m/s
MV Peak grad: 114.1 mmHg
S' Lateral: 3.5 cm
Weight: 4031.77 [oz_av]

## 2023-01-19 LAB — POCT I-STAT 7, (LYTES, BLD GAS, ICA,H+H)
Acid-Base Excess: 10 mmol/L — ABNORMAL HIGH (ref 0.0–2.0)
Acid-Base Excess: 7 mmol/L — ABNORMAL HIGH (ref 0.0–2.0)
Bicarbonate: 33.2 mmol/L — ABNORMAL HIGH (ref 20.0–28.0)
Bicarbonate: 29.8 mmol/L — ABNORMAL HIGH (ref 20.0–28.0)
Calcium, Ion: 1.1 mmol/L — ABNORMAL LOW (ref 1.15–1.40)
Calcium, Ion: 1.12 mmol/L — ABNORMAL LOW (ref 1.15–1.40)
HCT: 29 % — ABNORMAL LOW (ref 39.0–52.0)
HCT: 32 % — ABNORMAL LOW (ref 39.0–52.0)
Hemoglobin: 9.9 g/dL — ABNORMAL LOW (ref 13.0–17.0)
Hemoglobin: 10.9 g/dL — ABNORMAL LOW (ref 13.0–17.0)
O2 Saturation: 100 %
O2 Saturation: 100 %
Patient temperature: 101.9
Patient temperature: 98.7
Potassium: 3.7 mmol/L (ref 3.5–5.1)
Potassium: 3.9 mmol/L (ref 3.5–5.1)
Sodium: 140 mmol/L (ref 135–145)
Sodium: 137 mmol/L (ref 135–145)
TCO2: 34 mmol/L — ABNORMAL HIGH (ref 22–32)
TCO2: 31 mmol/L (ref 22–32)
pCO2 arterial: 43.4 mmHg (ref 32–48)
pCO2 arterial: 33.9 mmHg (ref 32–48)
pH, Arterial: 7.499 — ABNORMAL HIGH (ref 7.35–7.45)
pH, Arterial: 7.552 — ABNORMAL HIGH (ref 7.35–7.45)
pO2, Arterial: 258 mmHg — ABNORMAL HIGH (ref 83–108)
pO2, Arterial: 207 mmHg — ABNORMAL HIGH (ref 83–108)

## 2023-01-19 LAB — CBC WITH DIFFERENTIAL/PLATELET
Abs Immature Granulocytes: 0.03 10*3/uL (ref 0.00–0.07)
Abs Immature Granulocytes: 0.03 10*3/uL (ref 0.00–0.07)
Basophils Absolute: 0 10*3/uL (ref 0.0–0.1)
Basophils Absolute: 0.1 10*3/uL (ref 0.0–0.1)
Basophils Relative: 0 %
Basophils Relative: 1 %
Eosinophils Absolute: 0 10*3/uL (ref 0.0–0.5)
Eosinophils Absolute: 0.2 10*3/uL (ref 0.0–0.5)
Eosinophils Relative: 0 %
Eosinophils Relative: 3 %
HCT: 29.5 % — ABNORMAL LOW (ref 39.0–52.0)
HCT: 26.1 % — ABNORMAL LOW (ref 39.0–52.0)
Hemoglobin: 9.6 g/dL — ABNORMAL LOW (ref 13.0–17.0)
Hemoglobin: 8.3 g/dL — ABNORMAL LOW (ref 13.0–17.0)
Immature Granulocytes: 0 %
Immature Granulocytes: 0 %
Lymphocytes Relative: 5 %
Lymphocytes Relative: 12 %
Lymphs Abs: 0.6 10*3/uL — ABNORMAL LOW (ref 0.7–4.0)
Lymphs Abs: 0.9 10*3/uL (ref 0.7–4.0)
MCH: 31.2 pg (ref 26.0–34.0)
MCH: 30.7 pg (ref 26.0–34.0)
MCHC: 32.5 g/dL (ref 30.0–36.0)
MCHC: 31.8 g/dL (ref 30.0–36.0)
MCV: 95.8 fL (ref 80.0–100.0)
MCV: 96.7 fL (ref 80.0–100.0)
Monocytes Absolute: 0.6 10*3/uL (ref 0.1–1.0)
Monocytes Absolute: 0.6 10*3/uL (ref 0.1–1.0)
Monocytes Relative: 6 %
Monocytes Relative: 8 %
Neutro Abs: 9.3 10*3/uL — ABNORMAL HIGH (ref 1.7–7.7)
Neutro Abs: 5.7 10*3/uL (ref 1.7–7.7)
Neutrophils Relative %: 89 %
Neutrophils Relative %: 76 %
Platelets: 88 10*3/uL — ABNORMAL LOW (ref 150–400)
Platelets: 87 10*3/uL — ABNORMAL LOW (ref 150–400)
RBC: 3.08 MIL/uL — ABNORMAL LOW (ref 4.22–5.81)
RBC: 2.7 MIL/uL — ABNORMAL LOW (ref 4.22–5.81)
RDW: 14.6 % (ref 11.5–15.5)
RDW: 14.9 % (ref 11.5–15.5)
WBC: 10.6 10*3/uL — ABNORMAL HIGH (ref 4.0–10.5)
WBC: 7.4 10*3/uL (ref 4.0–10.5)
nRBC: 0 % (ref 0.0–0.2)
nRBC: 0 % (ref 0.0–0.2)

## 2023-01-19 LAB — MAGNESIUM
Magnesium: 2.3 mg/dL (ref 1.7–2.4)
Magnesium: 2.2 mg/dL (ref 1.7–2.4)

## 2023-01-19 LAB — GLUCOSE, CAPILLARY
Glucose-Capillary: 121 mg/dL — ABNORMAL HIGH (ref 70–99)
Glucose-Capillary: 100 mg/dL — ABNORMAL HIGH (ref 70–99)
Glucose-Capillary: 138 mg/dL — ABNORMAL HIGH (ref 70–99)
Glucose-Capillary: 144 mg/dL — ABNORMAL HIGH (ref 70–99)
Glucose-Capillary: 113 mg/dL — ABNORMAL HIGH (ref 70–99)
Glucose-Capillary: 92 mg/dL (ref 70–99)
Glucose-Capillary: 95 mg/dL (ref 70–99)

## 2023-01-19 LAB — BASIC METABOLIC PANEL WITH GFR
Anion gap: 16 — ABNORMAL HIGH (ref 5–15)
Anion gap: 17 — ABNORMAL HIGH (ref 5–15)
BUN: 33 mg/dL — ABNORMAL HIGH (ref 6–20)
BUN: 29 mg/dL — ABNORMAL HIGH (ref 6–20)
CO2: 30 mmol/L (ref 22–32)
CO2: 28 mmol/L (ref 22–32)
Calcium: 9.2 mg/dL (ref 8.9–10.3)
Calcium: 9.2 mg/dL (ref 8.9–10.3)
Chloride: 94 mmol/L — ABNORMAL LOW (ref 98–111)
Chloride: 93 mmol/L — ABNORMAL LOW (ref 98–111)
Creatinine, Ser: 10.72 mg/dL — ABNORMAL HIGH (ref 0.61–1.24)
GFR, Estimated: 6 mL/min — ABNORMAL LOW (ref >60)
GFR, Estimated: 6 mL/min — ABNORMAL LOW (ref >60)
Glucose, Bld: 114 mg/dL — ABNORMAL HIGH (ref 70–99)
Glucose, Bld: 119 mg/dL — ABNORMAL HIGH (ref 70–99)
Potassium: 3.7 mmol/L (ref 3.5–5.1)
Potassium: 4.1 mmol/L (ref 3.5–5.1)
Sodium: 140 mmol/L (ref 135–145)

## 2023-01-19 LAB — RENAL FUNCTION PANEL
Albumin: 3 g/dL — ABNORMAL LOW (ref 3.5–5.0)
Anion gap: 15 (ref 5–15)
BUN: 34 mg/dL — ABNORMAL HIGH (ref 6–20)
CO2: 28 mmol/L (ref 22–32)
Calcium: 9 mg/dL (ref 8.9–10.3)
Chloride: 97 mmol/L — ABNORMAL LOW (ref 98–111)
Creatinine, Ser: 10.91 mg/dL — ABNORMAL HIGH (ref 0.61–1.24)
GFR, Estimated: 5 mL/min — ABNORMAL LOW (ref >60)
Glucose, Bld: 96 mg/dL (ref 70–99)
Phosphorus: 6.4 mg/dL — ABNORMAL HIGH (ref 2.5–4.6)
Potassium: 3.8 mmol/L (ref 3.5–5.1)
Sodium: 140 mmol/L (ref 135–145)

## 2023-01-19 LAB — PROTIME-INR
INR: 1.2 (ref 0.8–1.2)
Prothrombin Time: 15.1 s (ref 11.4–15.2)

## 2023-01-19 LAB — CBC
HCT: 27.6 % — ABNORMAL LOW (ref 39.0–52.0)
Hemoglobin: 9 g/dL — ABNORMAL LOW (ref 13.0–17.0)
MCH: 31.3 pg (ref 26.0–34.0)
MCHC: 32.6 g/dL (ref 30.0–36.0)
MCV: 95.8 fL (ref 80.0–100.0)
Platelets: 94 10*3/uL — ABNORMAL LOW (ref 150–400)
RBC: 2.88 MIL/uL — ABNORMAL LOW (ref 4.22–5.81)
RDW: 14.6 % (ref 11.5–15.5)
WBC: 11.1 10*3/uL — ABNORMAL HIGH (ref 4.0–10.5)
nRBC: 0.2 % (ref 0.0–0.2)

## 2023-01-19 LAB — TYPE AND SCREEN
ABO/RH(D): O POS
Antibody Screen: NEGATIVE

## 2023-01-19 LAB — HIV ANTIBODY (ROUTINE TESTING W REFLEX): HIV Screen 4th Generation wRfx: NONREACTIVE

## 2023-01-19 LAB — BASIC METABOLIC PANEL
Creatinine, Ser: 9.59 mg/dL — ABNORMAL HIGH (ref 0.61–1.24)
Sodium: 138 mmol/L (ref 135–145)

## 2023-01-19 LAB — HEMOGLOBIN A1C
Hgb A1c MFr Bld: 5.2 % (ref 4.8–5.6)
Mean Plasma Glucose: 102.54 mg/dL

## 2023-01-19 LAB — TECHNOLOGIST SMEAR REVIEW: Plt Morphology: NORMAL

## 2023-01-19 LAB — MRSA NEXT GEN BY PCR, NASAL: MRSA by PCR Next Gen: NOT DETECTED

## 2023-01-19 LAB — PROCALCITONIN: Procalcitonin: 3.35 ng/mL

## 2023-01-19 LAB — PHOSPHORUS: Phosphorus: 6.6 mg/dL — ABNORMAL HIGH (ref 2.5–4.6)

## 2023-01-19 LAB — LACTIC ACID, PLASMA
Lactic Acid, Venous: 2.2 mmol/L (ref 0.5–1.9)
Lactic Acid, Venous: 1.6 mmol/L (ref 0.5–1.9)

## 2023-01-19 MED ORDER — FENTANYL CITRATE PF 50 MCG/ML IJ SOSY
50.0000 ug | PREFILLED_SYRINGE | Freq: Once | INTRAMUSCULAR | Status: AC
  Administered 2023-01-19: 50 ug via INTRAVENOUS
  Filled 2023-01-19: qty 1

## 2023-01-19 MED ORDER — LIDOCAINE HCL (PF) 1 % IJ SOLN: 5.0000 mL | INTRAMUSCULAR | Status: DC | PRN

## 2023-01-19 MED ORDER — RENA-VITE PO TABS
1.0000 | ORAL_TABLET | Freq: Every day | ORAL | Status: DC
  Administered 2023-01-19: 1 via ORAL
  Filled 2023-01-19: qty 1

## 2023-01-19 MED ORDER — TRANEXAMIC ACID FOR EPISTAXIS
500.0000 mg | Freq: Once | TOPICAL | Status: AC
  Administered 2023-01-19: 500 mg via TOPICAL
  Filled 2023-01-19: qty 10

## 2023-01-19 MED ORDER — ACETAMINOPHEN 160 MG/5ML PO SOLN
650.0000 mg | Freq: Four times a day (QID) | ORAL | Status: DC | PRN
  Administered 2023-01-19: 650 mg
  Filled 2023-01-19 (×2): qty 20.3

## 2023-01-19 MED ORDER — PROPOFOL 1000 MG/100ML IV EMUL
0.0000 ug/kg/min | INTRAVENOUS | Status: DC
  Administered 2023-01-19: 30 ug/kg/min via INTRAVENOUS
  Administered 2023-01-19: 40 ug/kg/min via INTRAVENOUS
  Administered 2023-01-19 (×4): 35 ug/kg/min via INTRAVENOUS
  Administered 2023-01-20: 40 ug/kg/min via INTRAVENOUS
  Administered 2023-01-20: 35 ug/kg/min via INTRAVENOUS
  Administered 2023-01-20 (×3): 30 ug/kg/min via INTRAVENOUS
  Administered 2023-01-20 – 2023-01-21 (×4): 40 ug/kg/min via INTRAVENOUS
  Filled 2023-01-19: qty 100
  Filled 2023-01-19: qty 300
  Filled 2023-01-19: qty 200
  Filled 2023-01-19 (×4): qty 100

## 2023-01-19 MED ORDER — MIDAZOLAM HCL 2 MG/2ML IJ SOLN
1.0000 mg | INTRAMUSCULAR | Status: DC | PRN
  Administered 2023-01-19: 2 mg via INTRAVENOUS
  Filled 2023-01-19 (×3): qty 2

## 2023-01-19 MED ORDER — INSULIN ASPART 100 UNIT/ML IJ SOLN
0.0000 [IU] | INTRAMUSCULAR | Status: DC
  Administered 2023-01-19 (×2): 2 [IU] via SUBCUTANEOUS

## 2023-01-19 MED ORDER — PROPOFOL BOLUS VIA INFUSION
10.0000 mg | Freq: Once | INTRAVENOUS | Status: AC
  Administered 2023-01-19: 10 mg via INTRAVENOUS
  Filled 2023-01-19: qty 10

## 2023-01-19 MED ORDER — SODIUM BICARBONATE 8.4 % IV SOLN
INTRAVENOUS | Status: AC
  Filled 2023-01-19: qty 100

## 2023-01-19 MED ORDER — FENTANYL 2500MCG IN NS 250ML (10MCG/ML) PREMIX INFUSION
50.0000 ug/h | INTRAVENOUS | Status: DC
  Administered 2023-01-19: 125 ug/h via INTRAVENOUS
  Administered 2023-01-19: 50 ug/h via INTRAVENOUS
  Administered 2023-01-21: 100 ug/h via INTRAVENOUS
  Filled 2023-01-19 (×4): qty 250

## 2023-01-19 MED ORDER — PANTOPRAZOLE SODIUM 40 MG IV SOLR
40.0000 mg | Freq: Every day | INTRAVENOUS | Status: DC
  Administered 2023-01-19 – 2023-01-20 (×3): 40 mg via INTRAVENOUS
  Filled 2023-01-19 (×3): qty 10

## 2023-01-19 MED ORDER — SODIUM CHLORIDE 0.9 % IV SOLN
20.0000 ug | Freq: Once | INTRAVENOUS | Status: AC
  Administered 2023-01-19: 20 ug via INTRAVENOUS
  Filled 2023-01-19 (×2): qty 5

## 2023-01-19 MED ORDER — DOCUSATE SODIUM 50 MG/5ML PO LIQD
100.0000 mg | Freq: Two times a day (BID) | ORAL | Status: DC
  Administered 2023-01-19 – 2023-01-20 (×4): 100 mg
  Filled 2023-01-19 (×3): qty 10

## 2023-01-19 MED ORDER — HEPARIN SODIUM (PORCINE) 5000 UNIT/ML IJ SOLN
5000.0000 [IU] | Freq: Three times a day (TID) | INTRAMUSCULAR | Status: DC
  Administered 2023-01-19 – 2023-01-27 (×22): 5000 [IU] via SUBCUTANEOUS
  Filled 2023-01-19 (×23): qty 1

## 2023-01-19 MED ORDER — LIDOCAINE-PRILOCAINE 2.5-2.5 % EX CREA: 1.0000 | TOPICAL_CREAM | CUTANEOUS | Status: DC | PRN

## 2023-01-19 MED ORDER — ALTEPLASE 2 MG IJ SOLR: 2.0000 mg | Freq: Once | INTRAMUSCULAR | Status: DC | PRN

## 2023-01-19 MED ORDER — VASOPRESSIN 20 UNITS/100 ML INFUSION FOR SHOCK
0.0000 [IU]/min | INTRAVENOUS | Status: DC
  Administered 2023-01-19: 0.03 [IU]/min via INTRAVENOUS
  Filled 2023-01-19: qty 100

## 2023-01-19 MED ORDER — LORAZEPAM 2 MG/ML IJ SOLN: 4.0000 mg | INTRAMUSCULAR | Status: DC | PRN

## 2023-01-19 MED ORDER — ALBUMIN HUMAN 25 % IV SOLN
INTRAVENOUS | Status: AC
  Administered 2023-01-19: 12.5 g via INTRAVENOUS_CENTRAL
  Filled 2023-01-19: qty 50

## 2023-01-19 MED ORDER — PENTAFLUOROPROP-TETRAFLUOROETH EX AERO: 1.0000 | INHALATION_SPRAY | CUTANEOUS | Status: DC | PRN

## 2023-01-19 MED ORDER — ORAL CARE MOUTH RINSE: 15.0000 mL | OROMUCOSAL | Status: DC | PRN

## 2023-01-19 MED ORDER — FENTANYL BOLUS VIA INFUSION
50.0000 ug | INTRAVENOUS | Status: DC | PRN
  Administered 2023-01-19: 100 ug via INTRAVENOUS
  Administered 2023-01-19 (×2): 50 ug via INTRAVENOUS
  Administered 2023-01-20 – 2023-01-21 (×6): 100 ug via INTRAVENOUS

## 2023-01-19 MED ORDER — DOCUSATE SODIUM 100 MG PO CAPS: 100.0000 mg | ORAL_CAPSULE | Freq: Two times a day (BID) | ORAL | Status: DC | PRN

## 2023-01-19 MED ORDER — POLYETHYLENE GLYCOL 3350 17 G PO PACK
17.0000 g | PACK | Freq: Every day | ORAL | Status: DC
  Administered 2023-01-19 – 2023-01-20 (×2): 17 g
  Filled 2023-01-19 (×2): qty 1

## 2023-01-19 MED ORDER — VASOPRESSIN 20 UNITS/100 ML INFUSION FOR SHOCK
INTRAVENOUS | Status: AC
  Administered 2023-01-19: 0.03 [IU]/min via INTRAVENOUS
  Filled 2023-01-19: qty 100

## 2023-01-19 MED ORDER — NOREPINEPHRINE 4 MG/250ML-% IV SOLN
INTRAVENOUS | Status: AC
  Administered 2023-01-19: 2 ug/min via INTRAVENOUS
  Filled 2023-01-19: qty 250

## 2023-01-19 MED ORDER — HEPARIN SODIUM (PORCINE) 1000 UNIT/ML DIALYSIS
1000.0000 [IU] | INTRAMUSCULAR | Status: DC | PRN
  Administered 2023-01-19: 3200 [IU]
  Administered 2023-01-20: 1000 [IU]
  Administered 2023-01-22: 3000 [IU]
  Filled 2023-01-19: qty 1

## 2023-01-19 MED ORDER — SODIUM CHLORIDE 0.9 % IV SOLN
250.0000 mL | INTRAVENOUS | Status: DC
  Administered 2023-01-21: 250 mL via INTRAVENOUS

## 2023-01-19 MED ORDER — POLYETHYLENE GLYCOL 3350 17 G PO PACK: 17.0000 g | PACK | Freq: Every day | ORAL | Status: DC | PRN

## 2023-01-19 MED ORDER — PROPOFOL 1000 MG/100ML IV EMUL
INTRAVENOUS | Status: AC
  Administered 2023-01-19: 5 ug/kg/min via INTRAVENOUS
  Filled 2023-01-19: qty 100

## 2023-01-19 MED ORDER — NOREPINEPHRINE 4 MG/250ML-% IV SOLN
2.0000 ug/min | INTRAVENOUS | Status: DC
  Administered 2023-01-19: 5 ug/min via INTRAVENOUS
  Filled 2023-01-19 (×3): qty 250

## 2023-01-19 MED ORDER — ORAL CARE MOUTH RINSE
15.0000 mL | OROMUCOSAL | Status: DC
  Administered 2023-01-19 – 2023-01-21 (×28): 15 mL via OROMUCOSAL

## 2023-01-19 MED ORDER — ALBUMIN HUMAN 25 % IV SOLN
50.0000 g | Freq: Once | INTRAVENOUS | Status: AC
  Administered 2023-01-19: 12.5 g via INTRAVENOUS

## 2023-01-19 MED ORDER — SODIUM CHLORIDE 0.9 % IV SOLN
1.5000 g | Freq: Two times a day (BID) | INTRAVENOUS | Status: DC
  Administered 2023-01-19 – 2023-01-20 (×3): 1.5 g via INTRAVENOUS
  Filled 2023-01-19 (×4): qty 4

## 2023-01-19 MED ORDER — LEVETIRACETAM IN NACL 500 MG/100ML IV SOLN
500.0000 mg | Freq: Two times a day (BID) | INTRAVENOUS | Status: DC
  Administered 2023-01-19 – 2023-01-21 (×5): 500 mg via INTRAVENOUS
  Filled 2023-01-19 (×5): qty 100

## 2023-01-19 MED ORDER — ANTICOAGULANT SODIUM CITRATE 4% (200MG/5ML) IV SOLN: 5.0000 mL | Status: DC | PRN

## 2023-01-19 NOTE — Progress Notes (Addendum)
eLink Physician-Brief Progress Note Patient Name: [name] [name] DOB: [birth year] MRN: [id number]   Date of Service  01/19/2023  HPI/Events of Note  32F with RSRD, DM2 and hx cancer p/w witnessed seizure at [name]. In the ED he was altered and hypoxemic. Bit his tongue and had bleeding. Intubated for hypoxemia as low as 60s. Labs significant for BUN/Cr 30/10.29. BNP 533. LA 6.3. WBC 6.8, Hg 10.9, Plt 102. CTH NAICA. CXR diffuse bilateral airspace disease concerning for edema vs pna, ETT 5.3 cm. ABG pH 7.1. Given sodium bicarb amp. ED consulted nephrology for dialysis tonight  Camera check: obtunded male on MV. Right perm cath. On MV via ETT.  eICU Interventions   S/p keppra load in ED. Continue home Keppra. EEG. Neuro consulted Full vent support. Advance ETT 2 cm. F/u CXR Emergent dialysis S/p DDAVP and local TXA for tongue bleeding    3:14 AM. ETT 28 cm at the lip. 3 cm above the carina on repeat CXR x 2.  Intervention Category Evaluation Type: New Patient Evaluation  [name] [name] 01/19/2023, 1:21 AM

## 2023-01-19 NOTE — Consult Note (Addendum)
[facility] Renal Consultation Note    Indication for Consultation:  Management of ESRD/hemodialysis; anemia, hypertension/volume and secondary hyperparathyroidism PCP:  HPI: [name] [name] is a 42 y.o. male with ESRD on hemodialysis MWF at [facility]. Last HD 01/18/2023.High IDWG, left 2.7 kg above OP EDW. PMH: Lupus Nephritis, HTN, Seizure disorder on Keppra. Anemia of ESRD, SHPT.   Patient presented to ED after possible seizure. He was found at [facility] reported seizure activity. He was noted to be hypertensive on arrival to ED. CXR showed cardiomegaly, vascular congestion and mild diffuse airspace disease, likely edema.CT of head without acute abnormalities. He had recurrent seizure in EDand bite his tongue.He was lethargic, refusing bipap. He was intubated while in ED. Marland Kitchen He was admitted to MICU. HD was attempted this AM. He developed hypotension during HD and is currently on Levophed and vasopressin for BP support. He is sedated. He is being followed by PCCM for management.   History reviewed. No pertinent past medical history. History reviewed. No pertinent surgical history. History reviewed. No pertinent family history. Social History:  has no history on file for tobacco use, alcohol use, and drug use. Allergies  Allergen Reactions   Cetirizine & Related Swelling   Prior to Admission medications   Medication Sig Start Date End Date Taking? Authorizing Provider  amLODipine (NORVASC) 10 MG tablet Take 10 mg by mouth at bedtime. 01/16/23  Yes [provider]  carvedilol (COREG) 6.25 MG tablet Take 6.25 mg by mouth 2 (two) times daily. 01/16/23  Yes [provider]  levETIRAcetam (KEPPRA) 1000 MG tablet Take 1,000 mg by mouth daily. 12/28/22  Yes [provider]   Current Facility-Administered Medications  Medication Dose Route Frequency Provider Last Rate Last Admin   0.9 %  sodium chloride infusion  250 mL Intravenous  Continuous Cloyd Stagers M, PA-C       acetaminophen (TYLENOL) 160 MG/5ML solution 650 mg  650 mg Per Tube Q6H PRN Cloyd Stagers M, PA-C   650 mg at 01/19/23 0203   alteplase (CATHFLO ACTIVASE) injection 2 mg  2 mg Intracatheter Once PRN Tyler Pita, MD       ampicillin-sulbactam (UNASYN) 1.5 g in sodium chloride 0.9 % 100 mL IVPB  1.5 g Intravenous Q12H Leander Rams, [facility]   Stopped at 01/19/23 4098   anticoagulant sodium citrate solution 5 mL  5 mL Intracatheter PRN Tyler Pita, MD       Chlorhexidine Gluconate Cloth 2 % PADS 6 each  6 each Topical Q0600 Tyler Pita, MD   6 each at 01/19/23 0359   docusate (COLACE) 50 MG/5ML liquid 100 mg  100 mg Per Tube BID Cloyd Stagers M, PA-C       docusate sodium (COLACE) capsule 100 mg  100 mg Oral BID PRN Cloyd Stagers M, PA-C       fentaNYL (SUBLIMAZE) bolus via infusion 50-100 mcg  50-100 mcg Intravenous Q15 min PRN Cloyd Stagers M, PA-C   50 mcg at 01/19/23 0127   fentaNYL in NS (29mcg/ml) infusion-PREMIX  50-200 mcg/hr Intravenous Continuous Cloyd Stagers M, PA-C 7.5 mL/hr at 01/19/23 0900 75 mcg/hr at 01/19/23 0900   heparin injection 1,000 Units  1,000 Units Intracatheter PRN Tyler Pita, MD   3,200 Units at 01/19/23 0805   heparin injection 5,000 Units  5,000 Units Subcutaneous Q8H Marrianne Mood, MD       insulin aspart (novoLOG) injection 0-15 Units  0-15 Units Subcutaneous Q4H  Tim Lair, PA-C   2 Units at 01/19/23 0800   levETIRAcetam (KEPPRA) IVPB 500 mg/100 mL premix  500 mg Intravenous Q12H Marrianne Mood, MD       lidocaine (PF) (XYLOCAINE) 1 % injection 5 mL  5 mL Intradermal PRN Tyler Pita, MD       lidocaine-prilocaine (EMLA) cream 1 Application  1 Application Topical PRN Tyler Pita, MD       LORazepam (ATIVAN) injection 4 mg  4 mg Intravenous Q5 Min x 2 PRN Marrianne Mood, MD       midazolam (VERSED) injection 1-2 mg  1-2 mg Intravenous Q1H PRN Cloyd Stagers M, PA-C   2 mg at 01/19/23 3086   multivitamin (RENA-VIT) tablet 1 tablet  1 tablet Oral QHS Pola Corn, NP       norepinephrine (LEVOPHED) 4mg  in (0.016 mg/mL) premix infusion  2-10 mcg/min Intravenous Titrated Cloyd Stagers M, PA-C 37.5 mL/hr at 01/19/23 0900 10 mcg/min at 01/19/23 0900   Oral care mouth rinse  15 mL Mouth Rinse Q2H Kalman Shan, MD   15 mL at 01/19/23 1000   Oral care mouth rinse  15 mL Mouth Rinse PRN Kalman Shan, MD       pantoprazole (PROTONIX) injection 40 mg  40 mg Intravenous QHS Cloyd Stagers M, PA-C   40 mg at 01/19/23 5784   pentafluoroprop-tetrafluoroeth (GEBAUERS) aerosol 1 Application  1 Application Topical PRN Tyler Pita, MD       polyethylene glycol (MIRALAX / GLYCOLAX) packet 17 g  17 g Oral Daily PRN Cloyd Stagers M, PA-C       polyethylene glycol (MIRALAX / GLYCOLAX) packet 17 g  17 g Per Tube Daily Cloyd Stagers M, PA-C       propofol (DIPRIVAN) 1000 MG/100ML infusion  0-50 mcg/kg/min Intravenous Continuous Cloyd Stagers M, PA-C 19.44 mL/hr at 01/19/23 0900 30 mcg/kg/min at 01/19/23 0900   vasopressin (PITRESSIN) 20 Units in 100 mL (0.2 unit/mL) infusion-*FOR SHOCK*  0-0.03 Units/min Intravenous Continuous Marrianne Mood, MD 9 mL/hr at 01/19/23 0900 0.03 Units/min at 01/19/23 0900   Labs: Basic Metabolic Panel: Recent Labs  Lab 01/18/23 2056 01/18/23 2131 01/18/23 2141 01/19/23 0211 01/19/23 0343 01/19/23 0720  NA 136 139   < > 140 140 137  K 4.7 4.8   < > 3.7 3.8 3.9  CL 95* 97*  --  94* 97*  --   CO2 23  --   --  30 28  --   GLUCOSE 97 95  --  114* 96  --   BUN 30* 41*  --  33* 34*  --   CREATININE 10.29* 11.30*  --  10.72* 10.91*  --   CALCIUM 9.2  --   --  9.2 9.0  --   PHOS  --   --   --  6.6* 6.4*  --    < > = values in this interval not displayed.   Liver Function Tests: Recent Labs  Lab 01/18/23 2056 01/19/23 0343  AST 27  --   ALT 17  --   ALKPHOS 89  --   BILITOT 0.9   --   PROT 7.4  --   ALBUMIN 3.5 3.0*   No results for input(s): "LIPASE", "AMYLASE" in the last 168 hours. No results for input(s): "AMMONIA" in the last 168 hours. CBC: Recent Labs  Lab 01/18/23 2056 01/18/23 2131 01/19/23 0211 01/19/23 0343 01/19/23 0720  WBC 6.8  --  10.6* 11.1*  --   NEUTROABS 4.4  --  9.3*  --   --   HGB 10.9*   < > 9.6* 9.0* 10.9*  HCT 34.0*   < > 29.5* 27.6* 32.0*  MCV 98.0  --  95.8 95.8  --   PLT 102*  --  88* 94*  --    < > = values in this interval not displayed.   Cardiac Enzymes: No results for input(s): "CKTOTAL", "CKMB", "CKMBINDEX", "TROPONINI" in the last 168 hours. CBG: Recent Labs  Lab 01/18/23 2129 01/19/23 0111 01/19/23 0316 01/19/23 0903  GLUCAP 129* 121* 100* 138*   Iron Studies: No results for input(s): "IRON", "TIBC", "TRANSFERRIN", "FERRITIN" in the last 72 hours. Studies/Results: DG CHEST PORT 1 VIEW  Result Date: 01/19/2023 CLINICAL DATA:  Respiratory abnormalities EXAM: PORTABLE CHEST 1 VIEW COMPARISON:  Yesterday FINDINGS: Endotracheal tube with tip just below the clavicular head. An enteric tube reaches the stomach. Dialysis catheter with tip at the upper right atrium. Generalized airspace and interstitial opacity with mild improvement from earlier today, suspect improving edema. No effusion or pneumothorax. Stable cardiac enlargement. IMPRESSION: Stable hardware positioning and mildly improved aeration. Electronically Signed   By: Tiburcio Pea M.D.   On: 01/19/2023 10:17   EEG adult  Result Date: 01/19/2023 Windell Norfolk, MD     01/19/2023  9:21 AM History: 16 yea old man with seizure disorder, ESRD on dialysis who was found unresponsive. EEG to evaluated for seizure Duration: 25 minutes Technical aspects: This EEG study was done with scalp electrodes positioned according to the 10-20 International system of electrode placement. Electrical activity was reviewed with band pass filter of 1-70Hz , sensitivity of 7 uV/mm, display  speed of 32mm/sec with a 60Hz  notched filter applied as appropriate. EEG data were recorded continuously and digitally stored. Description of the recording: The background rhythms of this recording consists of a fairly well modulated medium amplitude delta rhythm with embedded sharply contoured waves.  Photic stimulation was not performed. Hyperventilation was not performed. No abnormal epileptiform discharges seen during this recording. There was moderate diffuse slowing. There were no electrographic seizure identified. Abnormality: Moderate diffuse slowing Impression: This is an abnormal EEG recorded while intubated and sedated due to moderate diffuse slowing. This is consistent with a generalized brain dysfunction, nonspecific. Windell Norfolk, MD [facility]   DG Chest Port 1 View  Result Date: 01/19/2023 CLINICAL DATA:  Endotracheal tube. EXAM: PORTABLE CHEST 1 VIEW COMPARISON:  01/19/2023. FINDINGS: The heart size and mediastinal contours are stable. Diffuse patchy airspace disease is noted bilaterally, unchanged. No effusion or pneumothorax. A right dialysis catheter is stable in position. An enteric tube terminates in the stomach. The endotracheal tube terminates 3.7 cm above the carina. A left axillary vascular stent is noted. IMPRESSION: Stable chest. Electronically Signed   By: Thornell Sartorius M.D.   On: 01/19/2023 03:18   DG CHEST PORT 1 VIEW  Result Date: 01/19/2023 CLINICAL DATA:  Intubation. EXAM: PORTABLE CHEST 1 VIEW COMPARISON:  01/19/2023. FINDINGS: The heart size and mediastinal contours are within normal limits. Patchy airspace disease is present in the lungs bilaterally. No effusion or pneumothorax. A right internal jugular central venous catheter is stable. The endotracheal tube terminates 5.6 cm above the carina. An enteric tube terminates in the stomach. IMPRESSION: 1. Diffuse airspace opacities in the lungs bilaterally, not significantly changed from the prior exam. 2.  Support apparatus as described above. Electronically Signed   By: Charlestine Night.D.  On: 01/19/2023 03:17   DG Chest Port 1 View  Result Date: 01/19/2023 CLINICAL DATA:  Respiratory failure EXAM: PORTABLE CHEST 1 VIEW COMPARISON:  12:25 a.m. FINDINGS: Endotracheal tube seen 6 cm above the carina. Nasogastric tube extends into the upper abdomen beyond the margin of the examination. Right internal jugular hemodialysis catheter tip noted within the right atrium. Pulmonary insufflation is stable and normal. Extensive, diffuse bilateral airspace infiltrate appears stable in keeping with acute Modic infiltrate, alveolar pulmonary edema, or ARDS. No pneumothorax or pleural effusion. Cardiac size within normal limits. No acute bone abnormality. IMPRESSION: 1. Support apparatus as described above. 2. Stable extensive, diffuse bilateral airspace infiltrate. Electronically Signed   By: Helyn Numbers M.D.   On: 01/19/2023 01:54   DG Abd Portable 1V  Result Date: 01/19/2023 CLINICAL DATA:  Orogastric tube placement EXAM: PORTABLE ABDOMEN - 1 VIEW COMPARISON:  None Available. FINDINGS: Orogastric tube tip overlies the proximal body of the stomach. Visualized abdominal gas pattern is nonobstructive. No free intraperitoneal gas. Bibasilar pulmonary infiltrates are present, nonspecific, infection versus alveolar pulmonary edema. Hemodialysis catheter tip noted within the right atrium. IMPRESSION: 1. Orogastric tube tip overlies the proximal body of the stomach. Electronically Signed   By: Helyn Numbers M.D.   On: 01/19/2023 01:52   DG CHEST PORT 1 VIEW  Result Date: 01/19/2023 CLINICAL DATA:  Endotracheal tube. EXAM: PORTABLE CHEST 1 VIEW COMPARISON:  01/18/2023. FINDINGS: The heart is enlarged and the mediastinal contour is within normal limits. Diffuse airspace opacities are noted in the lungs bilaterally, increased from the prior exam. An endotracheal tube terminates 5.3 cm above the carina. A right internal jugular  central venous catheter is stable. Vascular stent is noted in the left axilla. No acute osseous abnormality. IMPRESSION: 1. Diffuse airspace opacities in the lungs bilaterally, possible edema versus multifocal pneumonia. 2. Cardiomegaly. 3. Medical devices as described above. Electronically Signed   By: Thornell Sartorius M.D.   On: 01/19/2023 00:44   CT Head Wo Contrast  Result Date: 01/18/2023 CLINICAL DATA:  Seizure disorder, clinical change. EXAM: CT HEAD WITHOUT CONTRAST TECHNIQUE: Contiguous axial images were obtained from the base of the skull through the vertex without intravenous contrast. RADIATION DOSE REDUCTION: This exam was performed according to the departmental dose-optimization program which includes automated exposure control, adjustment of the mA and/or kV according to patient size and/or use of iterative reconstruction technique. COMPARISON:  01/02/2023. FINDINGS: Brain: No acute intracranial hemorrhage, midline shift or mass effect. No extra-axial fluid collection. Extensive subcortical and periventricular white matter hypodensities are present bilaterally. There is encephalomalacia in the frontoparietal region on the left. Diffuse atrophy is noted. No hydrocephalus. Vascular: No hyperdense vessel or unexpected calcification. Skull: Normal. Negative for fracture or focal lesion. Sinuses/Orbits: No acute finding. Other: None. IMPRESSION: 1. No acute intracranial process. 2. 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 Electronically Signed   By: Thornell Sartorius M.D.   On: 01/18/2023 23:23   DG Chest Port 1 View  Result Date: 01/18/2023 CLINICAL DATA:  Hypoxia EXAM: PORTABLE CHEST 1 VIEW COMPARISON:  None available. FINDINGS: Right dialysis catheter tip in the right atrium. Cardiomegaly, vascular congestion and mild diffuse airspace disease, likely edema. No effusions or acute bony abnormality. IMPRESSION: Cardiomegaly, mild pulmonary  edema. Electronically Signed   By: Charlett Nose M.D.   On: 01/18/2023 21:20    ROS: As per HPI otherwise negative.   Physical Exam: Vitals:   01/19/23 0815  01/19/23 0830 01/19/23 0839 01/19/23 0936  BP: (!) 129/106 (!) 149/130 114/75   Pulse: 81 83 83   Resp: (!) 0 (!) 25 17   Temp:   99.1 F (37.3 C)   TempSrc:      SpO2: 100% 100% 100% 100%  Weight:      Height:         General: Acutely ill patient orally intubated on vent Head: Normocephalic, atraumatic, sclera non-icteric, mucus membranes are dry Lungs: ET-Tube to vent, bilateral breath sounds, symmetrical chest excursion, decreased in bases otherwise CTAB.  Heart: RRR with S1 S2. No murmurs, rubs, or gallops appreciated. SR Abdomen: OG tube in place. NABS.  Lower extremities:Trace pedal edema Neuro: Sedated at present. . Dialysis Access: TDC drsg intact  Dialysis Orders: Center: [facility] MWF 4 hrs 200 NRe 400/800 112.8 kg 2.0 K/2.0 Ca TDC - No Heparin - Mircera 120 mcg IV q 2 weeks (last dose 01/16/2023) - Venofer 50 mg IV q week  Assessment/Plan: Seizures: Had not been taking OP meds. Found down at [employer] [facility]. Management per primary Acute hypoxic respiratory failure/possible aspiration PNA- Management per PCCM  Volume overload-Left HD unit 2.7 kgs above OP EDW. Attempted HD this AM but developed hypotension, HD aborted. 1.7 L removed. Follow volume status.  Hypotension: hypertensive on admission. Now on Levophed at 5 mcg/min, Vasopressin just DC'd. Per PCCM  ESRD -  MWF-Next HD 01/21/2023.   Anemia  - HGB 9.0 this AM per CBC. Recent ESA dose. Follow HGB. Acute blood loss from biting tongue.   Metabolic bone disease -  NPO at present. Corrected Ca+ 10 PO4 elevated. Will resume non-calcium binders when pt is eating.   Nutrition - Albumin 3.0 Renal diet/protein supplements when able to eat.   SLE- not following with Rheumatology  Chronic Thrombocytopenia-has not kept appts with hematology.  Delphin Funes H. Manson Passey,  NP-C 01/19/2023, 11:14 AM  [employer] [phone]

## 2023-01-19 NOTE — Procedures (Signed)
Intubation Procedure Note  [name] [name]  [id number]  May 26, 1981  Date:01/19/23  Time:12:20 AM   Provider Performing:Safa Derner    Procedure: Intubation (31500)  Indication(s) Respiratory Failure - ESRD patient with seizure. Bit tongue has oral bleeding. Encephalopathy is rapid when bipap removed even for 1 minute. Has respiratory and metabolic acidosis. Wet CXR. Needs emergent HD. Hypertensive.   Consent Unable to obtain consent due to emergent nature of procedure.   Anesthesia Etomidate, Rocuronium, and bic bolus   Time Out Verified patient identification, verified procedure, site/side was marked, verified correct patient position, special equipment/implants available, medications/allergies/relevant history reviewed, required imaging and test results available.   Sterile Technique Usual hand hygeine, masks, and gloves were used   Procedure Description Patient positioned in bed supine.  Sedation given as noted above.  Patient was intubated with endotracheal tube using Glidescope.  View was Grade 1 full glottis .  Number of attempts was  2 .  Colorimetric CO2 detector was consistent with tracheal placement.  Mach 4 blade used with glidescope. Oral cavity showed active bleeding from tongue and blood surrounding vocal cords. This was suctioned. Intiial 8 size et tube too big to pass . Also tongue was too large. Patient deaturated slowly to 75% and attempt stopped. Bagged. Lowest pulse ox 55% and then pulse ox went up to 94%. Repeat attempt with size 7/7.5 ET tube done and patient intubated   Complications/Tolerance None; patient tolerated the procedure well. But hypoxemic and needing ARDS seting Chest X-ray is ordered to verify placement.   EBL Ongoning tongue bleeding   Specimen(s) None     SIGNATURE    Dr. Kalman Shan, M.D., F.C.C.P,  Pulmonary and Critical Care Medicine Staff Physician, [facility] Director - Interstitial Lung  Disease  Program  Pulmonary Fibrosis [facility] at [facility] [place], [place], [address]   Pager: [phone], If no answer  -> Check AMION or Try [phone] Telephone (clinical office): [phone] Telephone (research): [phone]  12:23 AM 01/19/2023

## 2023-01-19 NOTE — Progress Notes (Signed)
Patient's ETT was advanced 2 cm per MD order.  ETT is 26 @ lip. Bilateral breath sounds auscultated and CXR for confirmation.

## 2023-01-19 NOTE — ED Notes (Signed)
Intensivists at bedside at 2355 assessing patient. Took him off BiPap around 2357 and he decompensated to saturations of 55%. They decided to intubate, first attempt made at 0005 was unsuccessful second attempted and successful at [facility]

## 2023-01-19 NOTE — Progress Notes (Signed)
Overnight Neurologist notified for st read

## 2023-01-19 NOTE — Progress Notes (Signed)
   01/19/23 0839  Vitals  Temp 99.1 F (37.3 C) (aux)  Pulse Rate 83  Resp 17  BP 114/75  SpO2 100 %  O2 Device Ventilator;Other (Comment) (remains intubated/sedated)  Weight  (not obtained---icu bed)  Type of Weight Post-Dialysis  Oxygen Therapy  FiO2 (%) 60 %  Post Treatment  Dialyzer Clearance Lightly streaked  Duration of HD Treatment -hour(s) 1.55 hour(s)  Hemodialysis Intake (mL) 100 mL  Liters Processed 45.8  Fluid Removed (mL) 1700 mL  Tolerated HD Treatment No (Comment) (pt unstable---crrt being ordered)   Received patient in bed to unit.  Alert and oriented.  Informed consent signed and in chart.   TX duration:1.55 min Pt unstaple--tx d/c--crrt to begin Pt intubated and sedated. Pt in ICU--bedside tx  Hand-off given to primary RN.  Access used: RDLC. Access issues: Line reversed.  Total UF removed: Medication(s) given:  Albumin 25grams IV x1    [facility]

## 2023-01-19 NOTE — Progress Notes (Signed)
Pharmacy Antibiotic Note  [name] [name] is a 42 y.o. male admitted on 01/18/2023 with AMS, seizure-like activity, respiratory distress with suspected aspiration pneumonia.  Patient is on dialysis. Pharmacy has been consulted for ampicillin/sulbactam dosing.  Plan: ampicillin/sulbactam 1.5g q12hr Monitor cultures, clinical status, and f/u duration    Height: 6\' 1"  (185.4 cm) Weight: 114.3 kg (251 lb 15.8 oz) IBW/kg (Calculated) : 79.9  Temp (24hrs), Avg:100 F (37.8 C), Min:99.4 F (37.4 C), Max:100.6 F (38.1 C)  Recent Labs  Lab 01/18/23 2056 01/18/23 2131 01/18/23 2228 01/19/23 0211 01/19/23 0343  WBC 6.8  --   --  10.6* 11.1*  CREATININE 10.29* 11.30*  --  10.72* 10.91*  LATICACIDVEN  --   --  6.3* 2.2* 1.6    Estimated Creatinine Clearance: 11.6 mL/min (A) (by C-G formula based on SCr of 10.91 mg/dL (H)).    Allergies  Allergen Reactions   Cetirizine & Related Swelling    Antimicrobials this admission: ampsulb 8/3 >>    Microbiology results: 8/3 BCx: pend  8/3 MRSA neg   Thank you for allowing pharmacy to be a part of this patient's care.  Alphia Moh, PharmD, BCPS, BCCP Clinical Pharmacist  Please check AMION for all [facility] Pharmacy phone numbers After 10:00 PM, call Main Pharmacy [phone]

## 2023-01-19 NOTE — Progress Notes (Addendum)
NAME:  [name] [name], MRN:  [id number], DOB:  1981/02/10, LOS: 0 ADMISSION DATE:  01/18/2023  History of Present Illness:  42 year old male with ESRD on HD MWF via right IJ tunneled dialysis catheter, seizures, presents after a seizure with altered mental status, additional seizure with tongue trauma while in emergency department and what sounds like an aspiration event which led to intubation and admission to ICU.  Significant Hospital Events:  8/2 admitted after seizure with hypoxic respiratory failure requiring intubation 8/3 HD initiated for volume overload but stopped for hypotension  Interim History / Subjective:  Intubated, arterial line placed, HD started, loaded with Keppra, vancomycin and Unasyn started for aspiration pneumonia.  Objective   Blood pressure 103/85, pulse 92, temperature (!) 100.6 F (38.1 C), temperature source Oral, resp. rate (!) 24, height 6\' 1"  (1.854 m), weight 114.3 kg, SpO2 100%.    Vent Mode: PRVC FiO2 (%):  [60 %-100 %] 60 % Set Rate:  [24 bmp] 24 bmp Vt Set:  [620 mL] 620 mL PEEP:  [12 cmH20] 12 cmH20 Plateau Pressure:  [29 cmH20-30 cmH20] 29 cmH20   Intake/Output Summary (Last 24 hours) at 01/19/2023 0658 Last data filed at 01/19/2023 0600 Gross per 24 hour  Intake 684.86 ml  Output --  Net 684.86 ml   Filed Weights   01/19/23 0000 01/19/23 0224 01/19/23 0639  Weight: 108 kg 114.3 kg 114.3 kg    Examination: Intubated and awake Heart rate and rhythm normal, occasional bigeminy on the monitor, radial pulses strong, no peripheral edema Ventilated at rate of 24, lung sounds coarse Skin is warm and dry Abdomen is nontender Alert and agitated off sedation  Labs: 7.499/43.4/258/28 Electrolytes look okay Lactate 1.6 Mild leukocytosis Mild anemia Thrombocytopenia  Imaging: CXR with patchy infiltrates in both lungs  Resolved Hospital Problem list   Resolved Problems:   * No resolved hospital problems. *   Assessment & Plan:   Principal Problem:   Seizure (HCC)  Seizure - Status post Keppra load - Levetiracetam 500 mg IV every 12 hours - No seizures off sedation, EEG is interictal, looks okay - Lorazepam as needed for seizures - May need another dose of Keppra as he got 2 hours of dialysis with 1.7 L removed  Acute hypoxic respiratory failure Aspiration pneumonia versus pneumonitis Pulmonary edema - Placed on BiPAP but was increasingly obtunded - Intubated for hypoxic respiratory failure and suspected volume overload, airway protection in setting of oropharyngeal bleeding - Pulmonary edema on chest x-ray, urgent HD initiated and terminated due to hypotension - Unasyn for aspiration pneumonia, DC vancomycin - Hold off on CRRT for now - Will try to wean as tolerated but if unsuccessful may need to switch over to a low tidal volume approach for ARDS  Shock - Did not tolerate dialysis, nor epi and vasopressin were started - DC HD, pressures stable - No epi and vasopressin to maintain MAP >65 but plan to wean pressors aggressively today  ESRD on dialysis - Good compliance with outpatient HD of late, good treatment times, no missed appointments - Except for pulmonary edema on his chest x-ray does not look volume up - Labs look okay  Encephalopathy - Initially postictal - Probably an element of hypoxic encephalopathy as well - Agitated off sedation - Try to wake up later today  Anemia Thrombocytopenia - Both chronic problems - Probably ESRD and SLE related - Smear and differential pending  SLE - Not currently on hydroxychloroquine - Poor follow-up with rheumatology of  late  Best practice (daily eval):  Diet/type: NPO DVT prophylaxis: prophylactic heparin  GI prophylaxis: PPI Lines: Dialysis Catheter, tunneled right double lumen Foley:  N/A Code Status:  full code  Marrianne Mood MD 01/19/2023, 6:58 AM  Pager: [phone]

## 2023-01-19 NOTE — Progress Notes (Signed)
*  PRELIMINARY RESULTS* Echocardiogram 2D Echocardiogram has been performed.  Stacey Drain 01/19/2023, 2:41 PM

## 2023-01-19 NOTE — ED Notes (Signed)
Intubated 0010

## 2023-01-19 NOTE — Progress Notes (Signed)
No need for ltm as per neuro

## 2023-01-19 NOTE — Progress Notes (Signed)
EEG complete - results pending 

## 2023-01-19 NOTE — Procedures (Signed)
  History:  56 yea old man with seizure disorder, ESRD on dialysis who was found unresponsive. EEG to evaluated for seizure  Duration: 25 minutes  Technical aspects: This EEG study was done with scalp electrodes positioned according to the 10-20 International system of electrode placement. Electrical activity was reviewed with band pass filter of 1-70Hz , sensitivity of 7 uV/mm, display speed of 46mm/sec with a 60Hz  notched filter applied as appropriate. EEG data were recorded continuously and digitally stored.   Description of the recording: The background rhythms of this recording consists of a fairly well modulated medium amplitude delta rhythm with embedded sharply contoured waves.  Photic stimulation was not performed. Hyperventilation was not performed. No abnormal epileptiform discharges seen during this recording. There was moderate diffuse slowing. There were no electrographic seizure identified.   Abnormality: Moderate diffuse slowing  Impression: This is an abnormal EEG recorded while intubated and sedated due to moderate diffuse slowing. This is consistent with a generalized brain dysfunction, nonspecific.   Windell Norfolk, MD [facility]

## 2023-01-19 NOTE — Progress Notes (Signed)
An USGPIV (ultrasound guided PIV) has been placed for short-term vasopressor infusion. A correctly placed ivWatch must be used when administering Vasopressors. Should this treatment be needed beyond 24 hours, central line access should be obtained.  It will be the responsibility of the bedside nurse to follow best practice to prevent extravasations.

## 2023-01-19 NOTE — H&P (Signed)
ATTESTATION & SIGNATURE   STAFF NOTE: I, Dr Lavinia Sharps have personally reviewed patient's available data, including medical history, events of note, physical examination and test results as part of my evaluation. I have discussed with resident/NP and other care providers such as pharmacist, RN and RRT.  In addition,  I personally evaluated patient and elicited key findings of   S: Date of admit 01/18/2023 with LOS 0 for today 01/19/2023 : [name] [name] is  -0 detailed nurse practitioner note to follow.  Patient's known to have end-stage renal disease on dialysis.  Based on his information while in the midst of respiratory distress he gets his dialysis at [facility] kidney practice and is being to dialysis as recently as a few days ago.  Unclear schedule.  He had 1 seizure and brought into the ER.  Initially awake requiring oxygen.  Then had a second seizure bit his tongue started bleeding.  Since then according to the EDP Dr. Fredderick Phenix he gets very easily drowsy when oxygen is removed or when the BiPAP is even transiently removed.  He is requiring BiPAP.  He is somewhat restless.  His ABG showed pH 7.13/pCO2 58 and pO2 77.Marland Kitchen  Hypertensive.  Critical care medicine called.   has no past medical history on file.   has no past surgical history on file.   O:  Blood pressure (!) 189/118, pulse 88, temperature 99.4 F (37.4 C), temperature source Axillary, resp. rate 20, height 6\' 1"  (1.854 m), weight 108 kg, SpO2 (!) 87%.   Alert and restless on BiPAP.  He was not wanting the BiPAP.  He appeared oriented.  He was moving all 4 extremities.  He was having blood coming out of his tongue.  Based on his request when BiPAP was removed he quickly became somnolent.  We put the BiPAP back on but he did not want it.  Decision made to intubate.  Bleeding tongue Retrograde and rocuronium good view of the glottis but still difficult intubation because of bleeding and obesity and big tongue.  Also  hypertensive  Postintubation was requiring 100% FiO2 and there is greater than 10 of PEEP in order to maintain saturations.   LABS    PULMONARY Recent Labs  Lab 01/18/23 2131 01/18/23 2141  PHART  --  7.138*  PCO2ART  --  57.7*  PO2ART  --  77*  HCO3  --  19.4*  TCO2 32 21*  O2SAT  --  89    CBC Recent Labs  Lab 01/18/23 2056 01/18/23 2131 01/18/23 2141  HGB 10.9* 11.2* 11.9*  HCT 34.0* 33.0* 35.0*  WBC 6.8  --   --   PLT 102*  --   --     COAGULATION No results for input(s): "INR" in the last 168 hours.  CARDIAC  No results for input(s): "TROPONINI" in the last 168 hours. No results for input(s): "PROBNP" in the last 168 hours.   CHEMISTRY Recent Labs  Lab 01/18/23 2056 01/18/23 2131 01/18/23 2141  NA 136 139 139  K 4.7 4.8 3.8  CL 95* 97*  --   CO2 23  --   --   GLUCOSE 97 95  --   BUN 30* 41*  --   CREATININE 10.29* 11.30*  --   CALCIUM 9.2  --   --    Estimated Creatinine Clearance: 10.9 mL/min (A) (by C-G formula based on SCr of 11.3 mg/dL (H)).   LIVER Recent Labs  Lab 01/18/23 2056  AST  27  ALT 17  ALKPHOS 89  BILITOT 0.9  PROT 7.4  ALBUMIN 3.5     INFECTIOUS Recent Labs  Lab 01/18/23 2228  LATICACIDVEN 6.3*     ENDOCRINE CBG (last 3)  Recent Labs    01/18/23 2129 01/19/23 0111  GLUCAP 129* 121*         IMAGING x24h  - image(s) personally visualized  -   highlighted in bold DG Chest Port 1 View  Result Date: 01/19/2023 CLINICAL DATA:  Respiratory failure EXAM: PORTABLE CHEST 1 VIEW COMPARISON:  12:25 a.m. FINDINGS: Endotracheal tube seen 6 cm above the carina. Nasogastric tube extends into the upper abdomen beyond the margin of the examination. Right internal jugular hemodialysis catheter tip noted within the right atrium. Pulmonary insufflation is stable and normal. Extensive, diffuse bilateral airspace infiltrate appears stable in keeping with acute Modic infiltrate, alveolar pulmonary edema, or ARDS. No  pneumothorax or pleural effusion. Cardiac size within normal limits. No acute bone abnormality. IMPRESSION: 1. Support apparatus as described above. 2. Stable extensive, diffuse bilateral airspace infiltrate. Electronically Signed   By: Helyn Numbers M.D.   On: 01/19/2023 01:54   DG Abd Portable 1V  Result Date: 01/19/2023 CLINICAL DATA:  Orogastric tube placement EXAM: PORTABLE ABDOMEN - 1 VIEW COMPARISON:  None Available. FINDINGS: Orogastric tube tip overlies the proximal body of the stomach. Visualized abdominal gas pattern is nonobstructive. No free intraperitoneal gas. Bibasilar pulmonary infiltrates are present, nonspecific, infection versus alveolar pulmonary edema. Hemodialysis catheter tip noted within the right atrium. IMPRESSION: 1. Orogastric tube tip overlies the proximal body of the stomach. Electronically Signed   By: Helyn Numbers M.D.   On: 01/19/2023 01:52   DG CHEST PORT 1 VIEW  Result Date: 01/19/2023 CLINICAL DATA:  Endotracheal tube. EXAM: PORTABLE CHEST 1 VIEW COMPARISON:  01/18/2023. FINDINGS: The heart is enlarged and the mediastinal contour is within normal limits. Diffuse airspace opacities are noted in the lungs bilaterally, increased from the prior exam. An endotracheal tube terminates 5.3 cm above the carina. A right internal jugular central venous catheter is stable. Vascular stent is noted in the left axilla. No acute osseous abnormality. IMPRESSION: 1. Diffuse airspace opacities in the lungs bilaterally, possible edema versus multifocal pneumonia. 2. Cardiomegaly. 3. Medical devices as described above. Electronically Signed   By: Thornell Sartorius M.D.   On: 01/19/2023 00:44   CT Head Wo Contrast  Result Date: 01/18/2023 CLINICAL DATA:  Seizure disorder, clinical change. EXAM: CT HEAD WITHOUT CONTRAST TECHNIQUE: Contiguous axial images were obtained from the base of the skull through the vertex without intravenous contrast. RADIATION DOSE REDUCTION: This exam was performed  according to the departmental dose-optimization program which includes automated exposure control, adjustment of the mA and/or kV according to patient size and/or use of iterative reconstruction technique. COMPARISON:  01/02/2023. FINDINGS: Brain: No acute intracranial hemorrhage, midline shift or mass effect. No extra-axial fluid collection. Extensive subcortical and periventricular white matter hypodensities are present bilaterally. There is encephalomalacia in the frontoparietal region on the left. Diffuse atrophy is noted. No hydrocephalus. Vascular: No hyperdense vessel or unexpected calcification. Skull: Normal. Negative for fracture or focal lesion. Sinuses/Orbits: No acute finding. Other: None. IMPRESSION: 1. No acute intracranial process. 2. 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 Electronically Signed   By: Thornell Sartorius M.D.   On: 01/18/2023 23:23   DG Chest Port 1 View  Result Date: 01/18/2023 CLINICAL DATA:  Hypoxia EXAM: PORTABLE  CHEST 1 VIEW COMPARISON:  None available. FINDINGS: Right dialysis catheter tip in the right atrium. Cardiomegaly, vascular congestion and mild diffuse airspace disease, likely edema. No effusions or acute bony abnormality. IMPRESSION: Cardiomegaly, mild pulmonary edema. Electronically Signed   By: Charlett Nose M.D.   On: 01/18/2023 21:20     2 A: Seizures with acute hypoxemic respiratory failure in patient with end-stage renal disease Appears to have volume overload/lung injury. Hypertension  Suspect seizure threshold in the setting of renal failure and then subsequently has had poor compensation needing intubation  Tongue laceration causing oral bleeding due to seizures  P:  -Full mechanical ventilator support -Emergent dialysis - Blood pressure control through dialysis but if otherwise might need medication -Neuroconsult Control bleeding of the tongue with DDAVP and local tranexamic  acid   Anti-infectives (From admission, onward)    Start     Dose/Rate Route Frequency Ordered Stop   01/18/23 2230  vancomycin (VANCOCIN) IVPB 1000 mg/200 mL premix  Status:  Discontinued        1,000 mg 200 mL/hr over 60 Minutes Intravenous  Once 01/18/23 2218 01/18/23 2222   01/18/23 2230  metroNIDAZOLE (FLAGYL) IVPB 500 mg        500 mg 100 mL/hr over 60 Minutes Intravenous  Once 01/18/23 2218 01/19/23 0047   01/18/23 2230  ceFEPIme (MAXIPIME) 2 g in sodium chloride 0.9 % 100 mL IVPB        2 g 200 mL/hr over 30 Minutes Intravenous  Once 01/18/23 2222 01/18/23 2323   01/18/23 2230  vancomycin (VANCOREADY) IVPB 2000 mg/400 mL        2,000 mg 200 mL/hr over 120 Minutes Intravenous  Once 01/18/23 2222          Rest per NP/medical resident whose note is outlined above and that I agree with  The patient is critically ill with multiple organ systems failure and requires high complexity decision making for assessment and support, frequent evaluation and titration of therapies, application of advanced monitoring technologies and extensive interpretation of multiple databases.   Critical Care Time devoted to patient care services described in this note is  35  Minutes. This time reflects time of care of this signee Dr Kalman Shan. This critical care time does not reflect procedure time, or teaching time or supervisory time of PA/NP/Med student/Med Resident etc but could involve care discussion time     Dr. Kalman Shan, M.D., [facility].C.P Pulmonary and Critical Care Medicine Staff Physician [facility] Pager: [phone], If no answer or between  15:00h - 7:00h: call [phone]  01/19/2023 1:59 AM

## 2023-01-19 NOTE — Procedures (Signed)
Arterial Catheter Insertion Procedure Note  [name] [name]  [id number]  [birth year]  Date:01/19/23  Time:3:42 AM    Provider Performing: Tim Lair    Procedure: Insertion of Arterial Line (56387) with US guidance (56433)    Indication(s) Blood pressure monitoring and/or need for frequent ABGs   Consent Unable to obtain consent due to emergent nature of procedure.   Anesthesia None   Time Out Verified patient identification, verified procedure, site/side was marked, verified correct patient position, special equipment/implants available, medications/allergies/relevant history reviewed, required imaging and test results available.   Sterile Technique Maximal sterile technique including full sterile barrier drape, hand hygiene, sterile gown, sterile gloves, mask, hair covering, sterile ultrasound probe cover (if used).   Procedure Description Area of catheter insertion was cleaned with chlorhexidine and draped in sterile fashion. With real-time ultrasound guidance an arterial catheter was placed into the right radial artery.  Appropriate arterial tracings confirmed on monitor.     Complications/Tolerance None; patient tolerated the procedure well.   EBL Minimal   Specimen(s) None   Tim Lair, [place] [facility] 01/19/23 3:43 AM  Please see [email or web address] for pager details.  From 7A-7P if no response, please call [phone] After hours, please call ELink [phone]

## 2023-01-19 NOTE — Progress Notes (Signed)
I-stat not transmitting results.  Post intubation ABG below:   ABG results:  pH       7.499 PCO2    43.4 PO2       258 HCO3     33.2 sO2        100

## 2023-01-20 ENCOUNTER — Inpatient Hospital Stay (HOSPITAL_COMMUNITY): Payer: 59

## 2023-01-20 DIAGNOSIS — R569 Unspecified convulsions: Secondary | ICD-10-CM | POA: Diagnosis not present

## 2023-01-20 DIAGNOSIS — J9601 Acute respiratory failure with hypoxia: Secondary | ICD-10-CM | POA: Diagnosis not present

## 2023-01-20 LAB — GLUCOSE, CAPILLARY
Glucose-Capillary: 79 mg/dL (ref 70–99)
Glucose-Capillary: 80 mg/dL (ref 70–99)
Glucose-Capillary: 87 mg/dL (ref 70–99)
Glucose-Capillary: 89 mg/dL (ref 70–99)

## 2023-01-20 LAB — PHOSPHORUS: Phosphorus: 6.1 mg/dL — ABNORMAL HIGH (ref 2.5–4.6)

## 2023-01-20 LAB — MAGNESIUM: Magnesium: 1.9 mg/dL (ref 1.7–2.4)

## 2023-01-20 LAB — CBC
HCT: 24.5 % — ABNORMAL LOW (ref 39.0–52.0)
Hemoglobin: 7.7 g/dL — ABNORMAL LOW (ref 13.0–17.0)
MCH: 30.4 pg (ref 26.0–34.0)
MCHC: 31.4 g/dL (ref 30.0–36.0)
MCV: 96.8 fL (ref 80.0–100.0)
Platelets: 93 10*3/uL — ABNORMAL LOW (ref 150–400)
RBC: 2.53 MIL/uL — ABNORMAL LOW (ref 4.22–5.81)
RDW: 14.9 % (ref 11.5–15.5)
WBC: 6.1 10*3/uL (ref 4.0–10.5)
nRBC: 0 % (ref 0.0–0.2)

## 2023-01-20 LAB — RENAL FUNCTION PANEL
Albumin: 2.9 g/dL — ABNORMAL LOW (ref 3.5–5.0)
Anion gap: 15 (ref 5–15)
BUN: 38 mg/dL — ABNORMAL HIGH (ref 6–20)
CO2: 27 mmol/L (ref 22–32)
Calcium: 9 mg/dL (ref 8.9–10.3)
Chloride: 95 mmol/L — ABNORMAL LOW (ref 98–111)
Creatinine, Ser: 11.48 mg/dL — ABNORMAL HIGH (ref 0.61–1.24)
GFR, Estimated: 5 mL/min — ABNORMAL LOW (ref >60)
Glucose, Bld: 93 mg/dL (ref 70–99)
Phosphorus: 8.2 mg/dL — ABNORMAL HIGH (ref 2.5–4.6)
Potassium: 3.8 mmol/L (ref 3.5–5.1)
Sodium: 137 mmol/L (ref 135–145)

## 2023-01-20 LAB — TRIGLYCERIDES: Triglycerides: 180 mg/dL — ABNORMAL HIGH (ref <150)

## 2023-01-20 MED ORDER — VITAL HIGH PROTEIN PO LIQD: 1000.0000 mL | ORAL | Status: DC

## 2023-01-20 MED ORDER — PROSOURCE TF20 ENFIT COMPATIBL EN LIQD
60.0000 mL | Freq: Every day | ENTERAL | Status: DC
  Filled 2023-01-20: qty 60

## 2023-01-20 MED ORDER — PROSOURCE TF20 ENFIT COMPATIBL EN LIQD
60.0000 mL | Freq: Three times a day (TID) | ENTERAL | Status: DC
  Administered 2023-01-20 (×2): 60 mL
  Filled 2023-01-20 (×2): qty 60

## 2023-01-20 MED ORDER — VITAL AF 1.2 CAL PO LIQD
1000.0000 mL | ORAL | Status: DC
  Administered 2023-01-20 (×2): 1000 mL

## 2023-01-20 MED ORDER — RENA-VITE PO TABS
1.0000 | ORAL_TABLET | Freq: Every day | ORAL | Status: DC
  Administered 2023-01-20: 1
  Filled 2023-01-20: qty 1

## 2023-01-20 MED ORDER — SODIUM CHLORIDE 0.9 % IV SOLN
1.5000 g | Freq: Two times a day (BID) | INTRAVENOUS | Status: AC
  Administered 2023-01-20 – 2023-01-23 (×7): 1.5 g via INTRAVENOUS
  Filled 2023-01-20 (×7): qty 4

## 2023-01-20 MED ORDER — DARBEPOETIN ALFA 60 MCG/0.3ML IJ SOSY
60.0000 ug | PREFILLED_SYRINGE | INTRAMUSCULAR | Status: DC
  Administered 2023-01-21: 60 ug via SUBCUTANEOUS
  Filled 2023-01-20: qty 0.3

## 2023-01-20 NOTE — Progress Notes (Signed)
[facility] Progress Note   Subjective:   Remains intubated.  Concern volume overload will thwart extubation attempt --> HD today.   Objective Vitals:   01/20/23 0600 01/20/23 0615 01/20/23 0733 01/20/23 0740  BP:      Pulse: (!) 58 (!) 56 (!) 56   Resp: 16 (!) 0 16   Temp:      TempSrc:      SpO2: 93% 97% 99% 100%  Weight:      Height:       Physical Exam General: intubated and sedated Heart: SB Lungs: coarse ant BL, on vent 40% FiO2 Abdomen: soft Extremities: trace edema Dialysis Access: [facility] c/d/i  Additional Objective Labs: Basic Metabolic Panel: Recent Labs  Lab 01/19/23 0211 01/19/23 0343 01/19/23 0720 01/19/23 1429 01/20/23 0514  NA 140 140 137 138 137  K 3.7 3.8 3.9 4.1 3.8  CL 94* 97*  --  93* 95*  CO2 30 28  --  28 27  GLUCOSE 114* 96  --  119* 93  BUN 33* 34*  --  29* 38*  CREATININE 10.72* 10.91*  --  9.59* 11.48*  CALCIUM 9.2 9.0  --  9.2 9.0  PHOS 6.6* 6.4*  --   --  8.2*   Liver Function Tests: Recent Labs  Lab 01/18/23 2056 01/19/23 0343 01/20/23 0514  AST 27  --   --   ALT 17  --   --   ALKPHOS 89  --   --   BILITOT 0.9  --   --   PROT 7.4  --   --   ALBUMIN 3.5 3.0* 2.9*   No results for input(s): "LIPASE", "AMYLASE" in the last 168 hours. CBC: Recent Labs  Lab 01/18/23 2056 01/18/23 2131 01/19/23 0211 01/19/23 0343 01/19/23 0720 01/19/23 1808 01/20/23 0514  WBC 6.8  --  10.6* 11.1*  --  7.4 6.1  NEUTROABS 4.4  --  9.3*  --   --  5.7  --   HGB 10.9*   < > 9.6* 9.0* 10.9* 8.3* 7.7*  HCT 34.0*   < > 29.5* 27.6* 32.0* 26.1* 24.5*  MCV 98.0  --  95.8 95.8  --  96.7 96.8  PLT 102*  --  88* 94*  --  87* 93*   < > = values in this interval not displayed.   Blood Culture    Component Value Date/Time   SDES BLOOD RIGHT HAND 01/19/2023 0213   SPECREQUEST  01/19/2023 6644    BOTTLES DRAWN AEROBIC AND ANAEROBIC Blood Culture results may not be optimal due to an excessive volume of blood received in culture bottles    CULT  01/19/2023 0213    NO GROWTH 1 DAY Performed at [facility], [address] [place], [place] [address]    REPTSTATUS PENDING 01/19/2023 2595    Cardiac Enzymes: No results for input(s): "CKTOTAL", "CKMB", "CKMBINDEX", "TROPONINI" in the last 168 hours. CBG: Recent Labs  Lab 01/19/23 1533 01/19/23 1933 01/19/23 2324 01/20/23 0339 01/20/23 0744  GLUCAP 113* 92 95 89 87   Iron Studies: No results for input(s): "IRON", "TIBC", "TRANSFERRIN", "FERRITIN" in the last 72 hours. @lablastinr3 @ Studies/Results: DG Chest Port 1 View  Result Date: 01/20/2023 CLINICAL DATA:  ET tube EXAM: PORTABLE CHEST 1 VIEW COMPARISON:  01/19/2023 FINDINGS: Endotracheal tube, right dialysis catheter, NG tube remain in place, unchanged. Mild cardiomegaly. Increasing perihilar and lower lobe airspace opacities, favor edema. Suspect layering left pleural effusion. No acute bony  abnormality. IMPRESSION: Cardiomegaly with increasing perihilar and lower lobe airspace disease, favor edema. Support devices stable. Electronically Signed   By: Charlett Nose M.D.   On: 01/20/2023 00:21   ECHOCARDIOGRAM COMPLETE  Result Date: 01/19/2023    ECHOCARDIOGRAM REPORT   Patient Name:   [name] [name] Date of Exam: 01/19/2023 Medical Rec #:  [id number]      Height:       73.0 in Accession #:    [id number]     Weight:       252.0 lb Date of Birth:  44-Jan-1982     BSA:          2.374 m Patient Age:    42 years       BP:           114/75 mmHg Patient Gender: M              HR:           65 bpm. Exam Location:  Inpatient Procedure: 2D Echo, Cardiac Doppler and Color Doppler Indications:    Acutre respiratory distress R06.03  History:        Patient has no prior history of Echocardiogram examinations. Hx                 of seizures and Acute respiratory failure with hypoxia (HCC).  Sonographer:    Celesta Gentile RCS Referring Phys: Kalman Shan IMPRESSIONS  1. Left ventricular ejection fraction, by estimation, is 55 to 60%.  The left ventricle has normal function. The left ventricle has no regional wall motion abnormalities. There is severe left ventricular hypertrophy. Left ventricular diastolic parameters  are indeterminate.  2. Right ventricular systolic function is mildly reduced. The right ventricular size is normal. Tricuspid regurgitation signal is inadequate for assessing PA pressure.  3. Left atrial size was mildly dilated.  4. The mitral valve is degenerative. Mild to moderate mitral valve regurgitation. Mild mitral stenosis. The mean mitral valve gradient is 5.1 mmHg with average heart rate of 67 bpm. Severe mitral annular calcification.  5. The aortic valve is grossly normal. There is mild calcification of the aortic valve. Aortic valve regurgitation is not visualized. Aortic valve sclerosis/calcification is present, without any evidence of aortic stenosis. FINDINGS  Left Ventricle: Left ventricular ejection fraction, by estimation, is 55 to 60%. The left ventricle has normal function. The left ventricle has no regional wall motion abnormalities. The left ventricular internal cavity size was normal in size. There is  severe left ventricular hypertrophy. Left ventricular diastolic function could not be evaluated due to mitral annular calcification (moderate or greater). Left ventricular diastolic parameters are indeterminate. Right Ventricle: The right ventricular size is normal. No increase in right ventricular wall thickness. Right ventricular systolic function is mildly reduced. Tricuspid regurgitation signal is inadequate for assessing PA pressure. Left Atrium: Left atrial size was mildly dilated. Right Atrium: Right atrial size was normal in size. Pericardium: There is no evidence of pericardial effusion. Mitral Valve: The mitral valve is degenerative in appearance. Severe mitral annular calcification. Mild to moderate mitral valve regurgitation. Mild mitral valve stenosis. The mean mitral valve gradient is 5.1 mmHg with  average heart rate of 67 bpm. Tricuspid Valve: The tricuspid valve is normal in structure. Tricuspid valve regurgitation is mild . No evidence of tricuspid stenosis. Aortic Valve: The aortic valve is grossly normal. There is mild calcification of the aortic valve. Aortic valve regurgitation is not visualized. Aortic valve sclerosis/calcification is present, without any evidence  of aortic stenosis. Aortic valve mean gradient measures 7.0 mmHg. Aortic valve peak gradient measures 13.7 mmHg. Aortic valve area, by VTI measures 3.53 cm. Pulmonic Valve: The pulmonic valve was normal in structure. Pulmonic valve regurgitation is trivial. No evidence of pulmonic stenosis. Aorta: The aortic root was not well visualized and the ascending aorta was not well visualized. Venous: The inferior vena cava was not well visualized. IAS/Shunts: No atrial level shunt detected by color flow Doppler.  LEFT VENTRICLE PLAX 2D LVIDd:         5.00 cm   Diastology LVIDs:         3.50 cm   LV e' medial:    4.35 cm/s LV PW:         1.60 cm   LV E/e' medial:  34.7 LV IVS:        1.70 cm   LV e' lateral:   4.90 cm/s LVOT diam:     2.40 cm   LV E/e' lateral: 30.8 LV SV:         118 LV SV Index:   50 LVOT Area:     4.52 cm  RIGHT VENTRICLE RV S prime:     10.20 cm/s TAPSE (M-mode): 2.0 cm LEFT ATRIUM             Index        RIGHT ATRIUM           Index LA diam:        3.80 cm 1.60 cm/m   RA Area:     17.80 cm LA Vol (A2C):   73.8 ml 31.09 ml/m  RA Volume:   48.20 ml  20.31 ml/m LA Vol (A4C):   63.0 ml 26.52 ml/m LA Biplane Vol: 73.2 ml 30.84 ml/m  AORTIC VALVE AV Area (Vmax):    2.98 cm AV Area (Vmean):   3.16 cm AV Area (VTI):     3.53 cm AV Vmax:           185.00 cm/s AV Vmean:          125.000 cm/s AV VTI:            0.333 m AV Peak Grad:      13.7 mmHg AV Mean Grad:      7.0 mmHg LVOT Vmax:         122.00 cm/s LVOT Vmean:        87.200 cm/s LVOT VTI:          0.260 m LVOT/AV VTI ratio: 0.78  AORTA Ao Root diam: 3.70 cm MITRAL VALVE  MV Area (PHT): 1.90 cm     SHUNTS MV Mean grad:  5.1 mmHg     Systemic VTI:  0.26 m MV Decel Time: 399 msec     Systemic Diam: 2.40 cm MR Peak grad: 114.1 mmHg MR Mean grad: 61.0 mmHg MR Vmax:      534.00 cm/s MR Vmean:     358.0 cm/s MV E velocity: 151.00 cm/s MV A velocity: 164.00 cm/s MV E/A ratio:  0.92 Weston Brass MD Electronically signed by Weston Brass MD Signature Date/Time: 01/19/2023/4:31:52 PM    Final    DG CHEST PORT 1 VIEW  Result Date: 01/19/2023 CLINICAL DATA:  Respiratory abnormalities EXAM: PORTABLE CHEST 1 VIEW COMPARISON:  Yesterday FINDINGS: Endotracheal tube with tip just below the clavicular head. An enteric tube reaches the stomach. Dialysis catheter with tip at the upper right atrium. Generalized airspace and interstitial opacity with mild improvement  from earlier today, suspect improving edema. No effusion or pneumothorax. Stable cardiac enlargement. IMPRESSION: Stable hardware positioning and mildly improved aeration. Electronically Signed   By: Tiburcio Pea M.D.   On: 01/19/2023 10:17   EEG adult  Result Date: 01/19/2023 Windell Norfolk, MD     01/19/2023  9:21 AM History: 28 yea old man with seizure disorder, ESRD on dialysis who was found unresponsive. EEG to evaluated for seizure Duration: 25 minutes Technical aspects: This EEG study was done with scalp electrodes positioned according to the 10-20 International system of electrode placement. Electrical activity was reviewed with band pass filter of 1-70Hz , sensitivity of 7 uV/mm, display speed of 74mm/sec with a 60Hz  notched filter applied as appropriate. EEG data were recorded continuously and digitally stored. Description of the recording: The background rhythms of this recording consists of a fairly well modulated medium amplitude delta rhythm with embedded sharply contoured waves.  Photic stimulation was not performed. Hyperventilation was not performed. No abnormal epileptiform discharges seen during this recording.  There was moderate diffuse slowing. There were no electrographic seizure identified. Abnormality: Moderate diffuse slowing Impression: This is an abnormal EEG recorded while intubated and sedated due to moderate diffuse slowing. This is consistent with a generalized brain dysfunction, nonspecific. Windell Norfolk, MD [facility]   DG Chest Port 1 View  Result Date: 01/19/2023 CLINICAL DATA:  Endotracheal tube. EXAM: PORTABLE CHEST 1 VIEW COMPARISON:  01/19/2023. FINDINGS: The heart size and mediastinal contours are stable. Diffuse patchy airspace disease is noted bilaterally, unchanged. No effusion or pneumothorax. A right dialysis catheter is stable in position. An enteric tube terminates in the stomach. The endotracheal tube terminates 3.7 cm above the carina. A left axillary vascular stent is noted. IMPRESSION: Stable chest. Electronically Signed   By: Thornell Sartorius M.D.   On: 01/19/2023 03:18   DG CHEST PORT 1 VIEW  Result Date: 01/19/2023 CLINICAL DATA:  Intubation. EXAM: PORTABLE CHEST 1 VIEW COMPARISON:  01/19/2023. FINDINGS: The heart size and mediastinal contours are within normal limits. Patchy airspace disease is present in the lungs bilaterally. No effusion or pneumothorax. A right internal jugular central venous catheter is stable. The endotracheal tube terminates 5.6 cm above the carina. An enteric tube terminates in the stomach. IMPRESSION: 1. Diffuse airspace opacities in the lungs bilaterally, not significantly changed from the prior exam. 2. Support apparatus as described above. Electronically Signed   By: Thornell Sartorius M.D.   On: 01/19/2023 03:17   DG Chest Port 1 View  Result Date: 01/19/2023 CLINICAL DATA:  Respiratory failure EXAM: PORTABLE CHEST 1 VIEW COMPARISON:  12:25 a.m. FINDINGS: Endotracheal tube seen 6 cm above the carina. Nasogastric tube extends into the upper abdomen beyond the margin of the examination. Right internal jugular hemodialysis catheter tip  noted within the right atrium. Pulmonary insufflation is stable and normal. Extensive, diffuse bilateral airspace infiltrate appears stable in keeping with acute Modic infiltrate, alveolar pulmonary edema, or ARDS. No pneumothorax or pleural effusion. Cardiac size within normal limits. No acute bone abnormality. IMPRESSION: 1. Support apparatus as described above. 2. Stable extensive, diffuse bilateral airspace infiltrate. Electronically Signed   By: Helyn Numbers M.D.   On: 01/19/2023 01:54   DG Abd Portable 1V  Result Date: 01/19/2023 CLINICAL DATA:  Orogastric tube placement EXAM: PORTABLE ABDOMEN - 1 VIEW COMPARISON:  None Available. FINDINGS: Orogastric tube tip overlies the proximal body of the stomach. Visualized abdominal gas pattern is nonobstructive. No free intraperitoneal gas. Bibasilar pulmonary infiltrates are present, nonspecific,  infection versus alveolar pulmonary edema. Hemodialysis catheter tip noted within the right atrium. IMPRESSION: 1. Orogastric tube tip overlies the proximal body of the stomach. Electronically Signed   By: Helyn Numbers M.D.   On: 01/19/2023 01:52   DG CHEST PORT 1 VIEW  Result Date: 01/19/2023 CLINICAL DATA:  Endotracheal tube. EXAM: PORTABLE CHEST 1 VIEW COMPARISON:  01/18/2023. FINDINGS: The heart is enlarged and the mediastinal contour is within normal limits. Diffuse airspace opacities are noted in the lungs bilaterally, increased from the prior exam. An endotracheal tube terminates 5.3 cm above the carina. A right internal jugular central venous catheter is stable. Vascular stent is noted in the left axilla. No acute osseous abnormality. IMPRESSION: 1. Diffuse airspace opacities in the lungs bilaterally, possible edema versus multifocal pneumonia. 2. Cardiomegaly. 3. Medical devices as described above. Electronically Signed   By: Thornell Sartorius M.D.   On: 01/19/2023 00:44   CT Head Wo Contrast  Result Date: 01/18/2023 CLINICAL DATA:  Seizure disorder,  clinical change. EXAM: CT HEAD WITHOUT CONTRAST TECHNIQUE: Contiguous axial images were obtained from the base of the skull through the vertex without intravenous contrast. RADIATION DOSE REDUCTION: This exam was performed according to the departmental dose-optimization program which includes automated exposure control, adjustment of the mA and/or kV according to patient size and/or use of iterative reconstruction technique. COMPARISON:  01/02/2023. FINDINGS: Brain: No acute intracranial hemorrhage, midline shift or mass effect. No extra-axial fluid collection. Extensive subcortical and periventricular white matter hypodensities are present bilaterally. There is encephalomalacia in the frontoparietal region on the left. Diffuse atrophy is noted. No hydrocephalus. Vascular: No hyperdense vessel or unexpected calcification. Skull: Normal. Negative for fracture or focal lesion. Sinuses/Orbits: No acute finding. Other: None. IMPRESSION: 1. No acute intracranial process. 2. 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 Electronically Signed   By: Thornell Sartorius M.D.   On: 01/18/2023 23:23   DG Chest Port 1 View  Result Date: 01/18/2023 CLINICAL DATA:  Hypoxia EXAM: PORTABLE CHEST 1 VIEW COMPARISON:  None available. FINDINGS: Right dialysis catheter tip in the right atrium. Cardiomegaly, vascular congestion and mild diffuse airspace disease, likely edema. No effusions or acute bony abnormality. IMPRESSION: Cardiomegaly, mild pulmonary edema. Electronically Signed   By: Charlett Nose M.D.   On: 01/18/2023 21:20   Medications:  sodium chloride     ampicillin-sulbactam (UNASYN) IV Stopped (01/20/23 0539)   anticoagulant sodium citrate     fentaNYL infusion INTRAVENOUS 100 mcg/hr (01/20/23 0600)   levETIRAcetam Stopped (01/19/23 2153)   norepinephrine (LEVOPHED) Adult infusion 4 mcg/min (01/20/23 0600)   propofol (DIPRIVAN) infusion 40 mcg/kg/min  (01/20/23 0639)   vasopressin 0.03 Units/min (01/20/23 0600)    Chlorhexidine Gluconate Cloth  6 each Topical Q0600   docusate  100 mg Per Tube BID   heparin injection (subcutaneous)  5,000 Units Subcutaneous Q8H   insulin aspart  0-15 Units Subcutaneous Q4H   multivitamin  1 tablet Per Tube QHS   mouth rinse  15 mL Mouth Rinse Q2H   pantoprazole (PROTONIX) IV  40 mg Intravenous QHS   polyethylene glycol  17 g Per Tube Daily   Dialysis Orders: Center: [facility] MWF 4 hrs 200 NRe 400/800 112.8 kg 2.0 K/2.0 Ca TDC - No Heparin - Mircera 120 mcg IV q 2 weeks (last dose 01/16/2023) - Venofer 50 mg IV q week   Assessment/Plan: Seizures: Had not been taking OP meds. Found down at [employer]. Management  per primary.  Acute hypoxic respiratory failure: volume + possible aspiration PNA- Management per PCCM and fluid removal with HD today  Volume overload-Left HD unit 2.7 kgs above OP EDW last tx, HD 8/3 AM had UF 1.7L then had marked hypotension requiring termination of tx.  PCCM wishes to extubate today but concern for pulm edema --> will do HD today with goal 3L as tolerated. Hypotension:  TTE unrevealing, poss sepsis with asp PNA - on abx per primary; pressors have weaned.    ESRD -  MWF-HD today for volume.  Will need to resume MWF schedule at some point prior to d/c.   Anemia  - HGB 9.0 > 7.7 this AM per CBC. Recent ESA dose. Follow HGB. Will start ESA.   Metabolic bone disease -  NPO at present. Corrected Ca+ 10 PO4 elevated. Will resume non-calcium binders when pt is eating.   Nutrition - Albumin 3.0 Renal diet/protein supplements when able to eat.   SLE- not following with Rheumatology  Chronic Thrombocytopenia-has not kept appts with hematology.  Estill Bakes MD 01/20/2023, 9:27 AM   [facility] Pager: [phone]

## 2023-01-20 NOTE — Progress Notes (Signed)
Attempted to place pt on vent wean. Pt became apneic. Returned to prior documented settings.

## 2023-01-20 NOTE — Progress Notes (Signed)
NAME:  [name] [name], MRN:  [id number], DOB:  [birth year], LOS: 1 ADMISSION DATE:  01/18/2023  History of Present Illness:  42 year old male with ESRD on HD MWF via right IJ tunneled dialysis catheter, seizures, presents after a seizure with altered mental status, additional seizure with tongue trauma while in emergency department and what sounds like an aspiration event which led to intubation and admission to ICU.  Significant Hospital Events:  8/2 admitted after seizure with hypoxic respiratory failure requiring intubation 8/3 HD initiated for volume overload but stopped for hypotension EEG 8/3 >> diffuse slowing but no evidence of seizure or epileptogenicity Echocardiogram 8/3 >> LVEF 55-60%, unable to estimate RV function or PAP  Interim History / Subjective:   0.40, PEEP 5 Fentanyl 100, propofol 40 Norepinephrine 4 Vasopressin 0.03 I/O+ 906cc total  Objective   Blood pressure 121/74, pulse (!) 56, temperature 97.8 F (36.6 C), temperature source Oral, resp. rate (!) 0, height 6\' 1"  (1.854 m), weight 114.3 kg, SpO2 97%.    Vent Mode: PRVC FiO2 (%):  [40 %-60 %] 40 % Set Rate:  [16 bmp-24 bmp] 16 bmp Vt Set:  [620 mL] 620 mL PEEP:  [5 cmH20-12 cmH20] 5 cmH20 Plateau Pressure:  [17 cmH20-28 cmH20] 20 cmH20   Intake/Output Summary (Last 24 hours) at 01/20/2023 0718 Last data filed at 01/20/2023 0600 Gross per 24 hour  Intake 1971.76 ml  Output 1750 ml  Net 221.76 ml   Filed Weights   01/19/23 0000 01/19/23 0224 01/19/23 0639  Weight: 108 kg 114.3 kg 114.3 kg    Examination: Appearing man, ventilated, sedated Overall calm.  He does open eyes to voice, moves lower extremities with stimulation.  Unclear whether he followed commands (propofol 20, fentanyl) Heart regular, distant without a murmur.   Lungs coarse bilaterally Abdomen nondistended with positive bowel sounds No rash No edema  Chest x-ray 8/4 reviewed, shows hardware in place, bilateral perihilar and  interstitial infiltrates consistent with pulmonary edema   Resolved Hospital Problem list   Resolved Problems:   * No resolved hospital problems. *   Assessment & Plan:  Principal Problem:   Seizure (HCC) Active Problems:   Acute respiratory failure with hypoxia (HCC)  Seizure -Continue scheduled Keppra -Lorazepam as needed -Will need to work on home compliance with his AED  Acute hypoxic respiratory failure Aspiration pneumonia versus pneumonitis Probable pulmonary edema -Continue PRVC, consider decrease to 6 from 8 cc/kg but suspect that this is principally pulmonary edema as opposed to acute lung injury/ARDS -Empiric antibiotics > Unasyn -Volume removal with HD.  Hopefully can do intermittent (may need CVVH depending on hemodynamics) -Follow chest x-ray  Shock, suspect sedating medications, volume status, consider component sepsis given possible aspiration event/pneumonia -Wean norepinephrine as able -Goal vasopressin off on 8/4 -Follow I's/O, careful UF with dialysis -Antibiotics as above -Echocardiogram LVEF 55-60%, unable to assess RV function or PAP  ESRD on dialysis -Volume removal with HD, hopefully will be intermittent but consider CVVH -Appreciate nephrology management -Follow BMP  Encephalopathy -Lighten sedation 8/4 formal wake-up -May be a candidate to transition propofol to Precedex -Careful with sedating medications  Anemia, chronic Thrombocytopenia, chronic -Normal peripheral smear 8/3 -Following CBC intermittently  SLE -Poor follow-up and compliance.  Not currently on hydroxychloroquine   Best practice (daily eval):  Diet/type: NPO DVT prophylaxis: prophylactic heparin  GI prophylaxis: PPI Lines: Dialysis Catheter, tunneled right double lumen Foley:  N/A Code Status:  full code  Critical care time 32 minutes  Levy Pupa,  MD, PhD 01/20/2023, 7:27 AM [facility] [phone] or if no answer before 7:00PM call  [phone] For any issues after 7:00PM please call eLink [phone]

## 2023-01-20 NOTE — Progress Notes (Signed)
Initial Nutrition Assessment  DOCUMENTATION CODES:   Not applicable  INTERVENTION:  Discontinue Vital HP Tube Feeds via OG-tube: Start Vital AF 1.2 at 20 mL/hr, advance by 10 mL every 4 hours to goal rate of  55 mL/hr (1320 mL per day) 60 mL ProSource TF20 - TID Regimen provides 1824 kcal, 159 gm protein, and 1070 mL free water daily.  Continue Renal Multivitamin w/ minerals daily  NUTRITION DIAGNOSIS:   Inadequate oral intake related to inability to eat as evidenced by estimated needs.  GOAL:   Patient will meet greater than or equal to 90% of their needs  MONITOR:   Weight trends, Vent status, TF tolerance, Labs, I & O's  REASON FOR ASSESSMENT:   Consult Enteral/tube feeding initiation and management  ASSESSMENT:   42 y.o. male presented to the ED with AMS and seizure like activity. PMH includes HTN, seizures, CVA, and ESRD on HD. Pt admitted with breakthrough seizures.   8/02 - admitted 8/03 - intubated  RD working remotely at time of assessment. Pt currently above EDW, no additional weight history available.   Patient is currently intubated on ventilator support. MV: 9 L/min MAP (a-line): 86 Temp (24hrs), Avg:98.2 F (36.8 C), Min:97.8 F (36.6 C), Max:98.6 F (37 C)  Drips Fentanyl Levophed Propofol: 25.9 ml/hr (684 kcal/day) Vasopressin   Medications reviewed and include: Colace, NovoLog SSI, Rena-vit, Protonix, Miralax, IV antibiotics  Labs reviewed: Sodium 137, Potassium 3.8, Phosphorus 8.2  CBG: 87-144 x 24 hrs   EDW: 112.8 kg  UOP: 50 mL x 24 hrs HD Net UF: 1700 mL   NUTRITION - FOCUSED PHYSICAL EXAM:  Deferred to follow-up.  Diet Order:   Diet Order     None       EDUCATION NEEDS:   Not appropriate for education at this time  Skin:  Skin Assessment: Reviewed RN Assessment  Last BM:  Unknown  Height:   Ht Readings from Last 1 Encounters:  01/19/23 6\' 1"  (1.854 m)    Weight:   Wt Readings from Last 1 Encounters:   01/19/23 114.3 kg    Ideal Body Weight:  83.6 kg  BMI:  Body mass index is 33.25 kg/m.  Estimated Nutritional Needs:  Kcal:  1700-2000 Protein:  150-180 grams Fluid:  1L + UOP   Kirby Crigler RD, LDN Clinical Dietitian See [facility] for contact information.

## 2023-01-20 NOTE — Progress Notes (Signed)
   01/20/23 1519  Vitals  Temp 97.6 F (36.4 C)  Temp Source Axillary  BP 114/61  During Treatment Monitoring  Intra-Hemodialysis Comments Tx completed  Post Treatment  Dialyzer Clearance Lightly streaked  Duration of HD Treatment -hour(s) 3 hour(s)  Hemodialysis Intake (mL) 0 mL  Liters Processed 50  Fluid Removed (mL) 2500 mL  Tolerated HD Treatment Yes  Post-Hemodialysis Comments Goal met.  Hemodialysis Catheter Right Double lumen Permanent (Tunneled)  Placement Date/Time: (c) 01/19/23 0106   Placed prior to admission: Yes  Person Inserting LDA: Wynona Luna, RN  Orientation: Right  Hemodialysis Catheter Type: Double lumen Permanent (Tunneled)  Site Condition No complications  Blue Lumen Status Heparin locked  Red Lumen Status Heparin locked  Catheter fill solution Heparin 1000 units/ml  Catheter fill volume (Arterial) 1.6 cc  Catheter fill volume (Venous) 1.6  Dressing Type Transparent  Dressing Status Antimicrobial disc in place;Clean, Dry, Intact  Interventions Dressing reinforced  Drainage Description None  Post treatment catheter status Capped and Clamped

## 2023-01-21 ENCOUNTER — Inpatient Hospital Stay (HOSPITAL_COMMUNITY): Payer: 59

## 2023-01-21 DIAGNOSIS — R569 Unspecified convulsions: Secondary | ICD-10-CM | POA: Diagnosis not present

## 2023-01-21 DIAGNOSIS — J9601 Acute respiratory failure with hypoxia: Secondary | ICD-10-CM | POA: Diagnosis not present

## 2023-01-21 LAB — GLUCOSE, CAPILLARY
Glucose-Capillary: 60 mg/dL — ABNORMAL LOW (ref 70–99)
Glucose-Capillary: 63 mg/dL — ABNORMAL LOW (ref 70–99)
Glucose-Capillary: 73 mg/dL (ref 70–99)
Glucose-Capillary: 77 mg/dL (ref 70–99)
Glucose-Capillary: 78 mg/dL (ref 70–99)
Glucose-Capillary: 79 mg/dL (ref 70–99)
Glucose-Capillary: 81 mg/dL (ref 70–99)
Glucose-Capillary: 81 mg/dL (ref 70–99)

## 2023-01-21 MED ORDER — AMLODIPINE BESYLATE 10 MG PO TABS
10.0000 mg | ORAL_TABLET | Freq: Every day | ORAL | Status: DC
  Administered 2023-01-21 – 2023-01-27 (×6): 10 mg via ORAL
  Filled 2023-01-21 (×6): qty 1

## 2023-01-21 MED ORDER — LEVETIRACETAM 500 MG PO TABS
500.0000 mg | ORAL_TABLET | Freq: Two times a day (BID) | ORAL | Status: DC
  Administered 2023-01-21 – 2023-02-13 (×45): 500 mg via ORAL
  Filled 2023-01-21 (×45): qty 1

## 2023-01-21 MED ORDER — HYDRALAZINE HCL 10 MG PO TABS
10.0000 mg | ORAL_TABLET | Freq: Four times a day (QID) | ORAL | Status: DC | PRN
  Administered 2023-01-21 – 2023-01-24 (×4): 10 mg via ORAL
  Filled 2023-01-21 (×6): qty 1

## 2023-01-21 MED ORDER — HYDRALAZINE HCL 20 MG/ML IJ SOLN
10.0000 mg | Freq: Once | INTRAMUSCULAR | Status: AC
  Administered 2023-01-21: 10 mg via INTRAVENOUS
  Filled 2023-01-21: qty 1

## 2023-01-21 MED ORDER — ACETAMINOPHEN 325 MG PO TABS
650.0000 mg | ORAL_TABLET | Freq: Four times a day (QID) | ORAL | Status: DC | PRN
  Administered 2023-01-21 – 2023-02-11 (×4): 650 mg via ORAL
  Filled 2023-01-21 (×3): qty 2

## 2023-01-21 MED ORDER — RENA-VITE PO TABS
1.0000 | ORAL_TABLET | Freq: Every day | ORAL | Status: DC
  Administered 2023-01-21 – 2023-02-12 (×23): 1 via ORAL
  Filled 2023-01-21 (×22): qty 1

## 2023-01-21 MED ORDER — CHLORHEXIDINE GLUCONATE CLOTH 2 % EX PADS
6.0000 | MEDICATED_PAD | Freq: Every day | CUTANEOUS | Status: DC
  Administered 2023-01-21 – 2023-01-23 (×3): 6 via TOPICAL

## 2023-01-21 NOTE — Progress Notes (Signed)
   [facility] Progress Note   Subjective:   alert and interacting   Objective Vitals:   01/21/23 0915 01/21/23 0930 01/21/23 0945 01/21/23 1126  BP:      Pulse: 77 79 78   Resp: 19 20 16    Temp:    98.3 F (36.8 C)  TempSrc:    Oral  SpO2: 98% 93% 93%   Weight:      Height:       Physical Exam General: extubated, alert Heart: SB Lungs: coarse ant BL, on vent 40% FiO2 Abdomen: soft Extremities: trace edema Dialysis Access: [facility] c/d/i    Dialysis Orders: [facility] MWF 4h   400/800 112.8kg  2/2 bath  TDC  Heparin none - Mircera 120 mcg IV q 2 weeks (last dose 01/16/2023) - Venofer 50 mg IV q week   Assessment/Plan: Seizures: Had not been taking OP meds. Found down at [employer]. Management per primary.  Acute hypoxic respiratory failure: volume + possible aspiration PNA. Had extra HD 8/04 for volume w/ 2.5 L taken off.   Volume overload - looks stable, is 1-2kg over dry wt today. Had extra HD yesterday.  Hypotension:  TTE unrevealing, poss sepsis with asp PNA - on abx per primary; pressors have weaned off.    ESRD -  MWF HD. Had extra HD 8/04 yesterday. Will plan next HD off schedule tomorrow.   Anemia  - HGB 9.0 > 7.7 this AM per CBC. Recent ESA dose. Follow HGB.  Darbe started here at 60 mcg sq weekly.   Metabolic bone disease -  NPO at present. Corrected Ca+ 10 PO4 elevated. Will resume non-calcium binders when pt is eating.   Nutrition - Albumin 3.0 Renal diet/protein supplements when able to eat.   SLE- not following with Rheumatology  Chronic Thrombocytopenia-has not kept appts with hematology.  Vinson Moselle  MD  [facility] 01/21/2023, 12:22 PM  Recent Labs  Lab 01/20/23 0514 01/20/23 1918 01/21/23 0345  HGB 7.7*  --  8.5*  ALBUMIN 2.9*  --  2.9*  CALCIUM 9.0  --  9.2  PHOS 8.2* 6.1* 7.8*  CREATININE 11.48*  --  9.54*  K 3.8  --  4.0    Inpatient medications:  Chlorhexidine Gluconate Cloth  6 each Topical Q0600   darbepoetin (ARANESP)  injection - DIALYSIS  60 mcg Subcutaneous Q Mon-1800   docusate  100 mg Per Tube BID   heparin injection (subcutaneous)  5,000 Units Subcutaneous Q8H   multivitamin  1 tablet Per Tube QHS   mouth rinse  15 mL Mouth Rinse Q2H    sodium chloride     ampicillin-sulbactam (UNASYN) IV Stopped (01/21/23 0531)   anticoagulant sodium citrate     levETIRAcetam Stopped (01/21/23 1059)   acetaminophen (TYLENOL) oral liquid 160 mg/5 mL, alteplase, anticoagulant sodium citrate, docusate sodium, heparin, lidocaine (PF), lidocaine-prilocaine, LORazepam, mouth rinse, pentafluoroprop-tetrafluoroeth, polyethylene glycol
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
## 2023-01-21 NOTE — Progress Notes (Signed)
Inpatient Rehab Admissions Coordinator:  ? ?Per therapy recommendations,  patient was screened for CIR candidacy by Earle Troiano, MS, CCC-SLP. At this time, Pt. Appears to be a a potential candidate for CIR. I will place   order for rehab consult per protocol for full assessment. Please contact me any with questions. ? ?Avianah Pellman, MS, CCC-SLP ?Rehab Admissions Coordinator  ?[phone] (celll) ?[phone] (office) ? ?

## 2023-01-21 NOTE — Inpatient Diabetes Management (Signed)
Inpatient Diabetes Program Recommendations  AACE/ADA: New Consensus Statement on Inpatient Glycemic Control (2015)  Target Ranges:  Prepandial:   less than 140 mg/dL      Peak postprandial:   less than 180 mg/dL (1-2 hours)      Critically ill patients:  140 - 180 mg/dL    Latest Reference Range & Units 01/20/23 23:45 01/21/23 03:25 01/21/23 03:28 01/21/23 07:16  Glucose-Capillary 70 - 99 mg/dL 80 63 (L) 77 81  (L): Data is abnormally low     Current Orders: Novolog Moderate Correction Scale/ SSI (0-15 units) Q4 hours     MD- Note Hypoglycemia this AM.  No Insulin has been given since 08/03.  Since pt has ESRD, please consider reducing the Novolog to the 0-6 unit (Very Sensitive) scale Q4 hours     --Will follow patient during hospitalization--  Ambrose Finland RN, MSN, CDCES Diabetes Coordinator Inpatient Glycemic Control Team Team Pager: [phone] (8a-5p)
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
## 2023-01-21 NOTE — Progress Notes (Signed)
   NAME:  [name] [name], MRN:  [id number], DOB:  1981-03-07, LOS: 2 ADMISSION DATE:  01/18/2023  History of Present Illness:  42 year old male with ESRD on HD MWF via right tunneled IJ dialysis catheter, history of seizures, presents after seizure with altered mental status and oral trauma, possible aspiration event which led to intubation and admission to ICU.  Doing better, extubated 01/21/2023.  Significant Hospital Events:  8/2 admitted after seizure with hypoxic respiratory failure requiring intubation 8/3 HD initiated for volume overload but stopped for hypotension EEG 8/3 >> diffuse slowing but no evidence of seizure or epileptogenicity Echocardiogram 8/3 >> LVEF 55-60%, unable to estimate RV function or PAP 8/5 extubated  Interim History / Subjective:  Pressors are off, sedation weaned to off, awake and alert, interactive, no pain or breathing difficulty.  Objective   Blood pressure 135/73, pulse (!) 56, temperature 98.2 F (36.8 C), temperature source Oral, resp. rate 16, height 6\' 1"  (1.854 m), weight 114.3 kg, SpO2 97%.    Vent Mode: PSV;CPAP FiO2 (%):  [40 %] 40 % Set Rate:  [16 bmp] 16 bmp Vt Set:  [620 mL] 620 mL PEEP:  [5 cmH20] 5 cmH20 Pressure Support:  [15 cmH20] 15 cmH20 Plateau Pressure:  [16 cmH20-20 cmH20] 16 cmH20   Intake/Output Summary (Last 24 hours) at 01/21/2023 0831 Last data filed at 01/21/2023 0700 Gross per 24 hour  Intake 2113.88 ml  Output 2500 ml  Net -386.12 ml   Filed Weights   01/20/23 1238 01/20/23 1522 01/21/23 0223  Weight: 114.3 kg 112 kg 114.3 kg    Examination: Awake, alert, no distress ETT in place, pressure support 5/5, lungs with coarse crackles Heart rate and rhythm normal, good peripheral pulses, no edema Abdomen soft, nontender Skin warm and dry Alert, follows instructions  Labs: Sodium 134 Potassium 4 Bicarb 26 WBC 6 Hemoglobin 8.5 Platelets 100  Imaging: CXR with patchy infiltrates, largely unchanged from prior  study  Resolved Hospital Problem list   Resolved Problems:   * No resolved hospital problems. *   Assessment & Plan:  Principal Problem:   Seizure (HCC) Active Problems:   Acute respiratory failure with hypoxia (HCC)  Seizure - No seizures since admission to ICU - Keppra 500 mg twice daily - As needed lorazepam - Work on adherence to home AEDs  Acute hypoxic respiratory failure, improving Aspiration pneumonia versus pneumonitis versus pulmonary edema - Doing well on SBT with 40% FiO2 5/5 - Extubation today - Volume removal with IHD - Unasyn through 01/22/2023  Shock, resolved Sedation induced versus septic - Pressors and sedating meds off today - Unasyn for aspiration pneumonia - Preserved EF on echo  ESRD on dialysis - Intermittent HD - Appreciate nephrology assistance  Encephalopathy, improving - Waking up well today - DC propofol and fentanyl infusion for extubation  Chronic anemia and thrombocytopenia - Follow-up outpatient  SLE - Follow-up outpatient  Best practice (daily eval):  Diet/type: NPO pending swallow eval DVT prophylaxis: prophylactic heparin  GI prophylaxis: N/A Lines: Dialysis Catheter tunneled R IJ Foley:  N/A Code Status:  full code  Marrianne Mood MD 01/21/2023, 8:31 AM  Pager: [phone]

## 2023-01-21 NOTE — Consult Note (Signed)
Physical Medicine and Rehabilitation Consult Reason for Consult: Weakness Referring Physician: Byrum   HPI: [name] [name] is a 42 y.o. male with history of end-stage renal disease on home hemodialysis, hypertension, prior CVA, SLE with antiphospholipid antibody syndrome but is no longer on Eliquis as well as poorly controlled seizure disorder who was admitted on 01/18/2023 through the ED for altered mental status.  The patient was postictal at the scene and then had a witnessed seizure in the ED.  CT of the head showed old left frontal parietal infarct.  The patient was hypoxic diagnosed with pneumonia treated with vancomycin and Unasyn.  The patient was intubated and was extubated on 01/21/2023.  After extubation patient had a physical therapy evaluation.  The patient had difficulty moving the right upper extremity.  The patient was reported as requiring min assist with ambulation 20 feet using a rolling walker.  OT evaluation is pending.   Review of Systems  Constitutional:  Negative for chills and fever.  HENT:  Negative for congestion.   Eyes:  Negative for discharge.  Respiratory:  Positive for cough. Negative for hemoptysis, sputum production, shortness of breath, wheezing and stridor.   Cardiovascular:  Negative for chest pain and leg swelling.  Gastrointestinal:  Negative for nausea and vomiting.  Genitourinary:  Negative for flank pain.  Musculoskeletal:  Positive for falls. Negative for joint pain and neck pain.  Skin:  Negative for itching and rash.  Neurological:  Positive for focal weakness and seizures. Negative for sensory change.  Psychiatric/Behavioral:  Positive for memory loss. The patient is nervous/anxious.      History reviewed. No pertinent past medical history. History reviewed. No pertinent surgical history. History reviewed. No pertinent family history. Social History:  has no history on file for tobacco use, alcohol use, and drug use. Allergies:  Allergies   Allergen Reactions   Cetirizine & Related Swelling   Medications Prior to Admission  Medication Sig Dispense Refill   amLODipine (NORVASC) 10 MG tablet Take 10 mg by mouth at bedtime.     carvedilol (COREG) 6.25 MG tablet Take 6.25 mg by mouth 2 (two) times daily.     levETIRAcetam (KEPPRA) 1000 MG tablet Take 1,000 mg by mouth daily.     SENSIPAR 60 MG tablet Take by mouth.      Home: Home Living Family/patient expects to be discharged to:: Private residence Living Arrangements: Alone Type of Home: Apartment Home Access: Stairs to enter Secretary/administrator of Steps: 1 Home Layout: One level Bathroom Shower/Tub: Engineer, manufacturing systems: Standard Home Equipment: None  Functional History: Prior Function Prior Level of Function : Working/employed, Driving Mobility Comments: independent, works as a Control and instrumentation engineer Status:  Mobility: Bed Mobility Overal bed mobility: Needs Assistance Bed Mobility: Supine to Sit Supine to sit: HOB elevated General bed mobility comments: HOb 40 degrees  with min assist to pivot to EOB and elevate trunk. Mod cues for safety to allow lines to be moved for management Transfers Overall transfer level: Needs assistance Transfers: Sit to/from Stand Sit to Stand: Min assist General transfer comment: min assist to stand from bed with cues for hand placement as pt under/overshooting efforts to reach for chair and RW. Mod cues to back fully to surface to sit and position RW Ambulation/Gait Ambulation/Gait assistance: Min assist Gait Distance (Feet): 20 Feet Assistive device: Rolling walker (2 wheels) Gait Pattern/deviations: Shuffle General Gait Details: Pt with short shuffling steps, impaired balance and requiring min assist to  direct and position RW. Pt stating blurry vision when looking at a distance. Mod cues for safety and return to room, cues for breathing technique Gait velocity interpretation: <1.31 ft/sec, indicative of  household ambulator    ADL:    Cognition: Cognition Overall Cognitive Status: Impaired/Different from baseline Orientation Level: Intubated/Tracheostomy - Unable to assess Cognition Arousal/Alertness: Awake/alert Behavior During Therapy: WFL for tasks assessed/performed Overall Cognitive Status: Impaired/Different from baseline Area of Impairment: Orientation, Attention, Following commands, Safety/judgement, Memory Orientation Level: Disoriented to, Time Current Attention Level: Sustained Memory: Decreased short-term memory Following Commands: Follows one step commands inconsistently, Follows one step commands with increased time Safety/Judgement: Decreased awareness of safety, Decreased awareness of deficits General Comments: pt repeatedly reaching out toward chair toward Rt EOB with under and over shooting, not oriented to month, decreased awareness of deficits and safety  Blood pressure (!) 165/119, pulse 78, temperature 98.3 F (36.8 C), temperature source Oral, resp. rate 16, height 6\' 1"  (1.854 m), weight 114.3 kg, SpO2 94%. Physical Exam  General: No acute distress Mood and affect mildly anxious and impulsive , gets up suddenly states "I gotta sh**!" Heart: Regular rate and rhythm no rubs murmurs or extra sounds Lungs: Clear to auscultation, breathing unlabored, no rales or wheezes Abdomen: Positive bowel sounds, soft nontender to palpation, nondistended Extremities: LUE AVG Skin: No evidence of breakdown, no evidence of rash Neurologic: Cranial nerves II through XII intact, motor strength is 5/5 in left deltoid, bicep, tricep, grip, Bilateral hip flexor, knee extensors, ankle dorsiflexor and plantar flexor 3- RIght delt, 4+ biceps, triceps and grip Sensory exam normal sensation to light touch  bilateral upper  extremities  Musculoskeletal: Full range of motion in all 4 extremities. No joint swelling  Results for orders placed or performed during the hospital encounter of  01/18/23 (from the past 24 hour(s))  Magnesium     Status: None   Collection Time: 01/20/23  7:18 PM  Result Value Ref Range   Magnesium 1.9 1.7 - 2.4 mg/dL  Phosphorus     Status: Abnormal   Collection Time: 01/20/23  7:18 PM  Result Value Ref Range   Phosphorus 6.1 (H) 2.5 - 4.6 mg/dL  Glucose, capillary     Status: None   Collection Time: 01/20/23  7:28 PM  Result Value Ref Range   Glucose-Capillary 79 70 - 99 mg/dL  Glucose, capillary     Status: None   Collection Time: 01/20/23 11:45 PM  Result Value Ref Range   Glucose-Capillary 80 70 - 99 mg/dL  Glucose, capillary     Status: Abnormal   Collection Time: 01/21/23  3:25 AM  Result Value Ref Range   Glucose-Capillary 63 (L) 70 - 99 mg/dL  Glucose, capillary     Status: None   Collection Time: 01/21/23  3:28 AM  Result Value Ref Range   Glucose-Capillary 77 70 - 99 mg/dL  CBC     Status: Abnormal   Collection Time: 01/21/23  3:45 AM  Result Value Ref Range   WBC 6.1 4.0 - 10.5 K/uL   RBC 2.76 (L) 4.22 - 5.81 MIL/uL   Hemoglobin 8.5 (L) 13.0 - 17.0 g/dL   HCT 16.1 (L) 09.6 - 04.5 %   MCV 97.1 80.0 - 100.0 fL   MCH 30.8 26.0 - 34.0 pg   MCHC 31.7 30.0 - 36.0 g/dL   RDW 40.9 81.1 - 91.4 %   Platelets 100 (L) 150 - 400 K/uL   nRBC 0.0 0.0 - 0.2 %  Renal function panel     Status: Abnormal   Collection Time: 01/21/23  3:45 AM  Result Value Ref Range   Sodium 134 (L) 135 - 145 mmol/L   Potassium 4.0 3.5 - 5.1 mmol/L   Chloride 93 (L) 98 - 111 mmol/L   CO2 26 22 - 32 mmol/L   Glucose, Bld 79 70 - 99 mg/dL   BUN 30 (H) 6 - 20 mg/dL   Creatinine, Ser 5.40 (H) 0.61 - 1.24 mg/dL   Calcium 9.2 8.9 - 98.1 mg/dL   Phosphorus 7.8 (H) 2.5 - 4.6 mg/dL   Albumin 2.9 (L) 3.5 - 5.0 g/dL   GFR, Estimated 6 (L) >60 mL/min   Anion gap 15 5 - 15  Magnesium     Status: None   Collection Time: 01/21/23  3:45 AM  Result Value Ref Range   Magnesium 2.1 1.7 - 2.4 mg/dL  Glucose, capillary     Status: None   Collection Time: 01/21/23   7:16 AM  Result Value Ref Range   Glucose-Capillary 81 70 - 99 mg/dL  Glucose, capillary     Status: None   Collection Time: 01/21/23 11:25 AM  Result Value Ref Range   Glucose-Capillary 79 70 - 99 mg/dL  Glucose, capillary     Status: None   Collection Time: 01/21/23  3:18 PM  Result Value Ref Range   Glucose-Capillary 73 70 - 99 mg/dL   DG Chest Port 1 View  Result Date: 01/21/2023 CLINICAL DATA:  Respiratory failure. EXAM: PORTABLE CHEST 1 VIEW COMPARISON:  AP chest 01/20/2023, 01/19/2023 (multiple studies) FINDINGS: Right internal jugular dual-lumen central venous catheter tips again overlie the superior right atrium. Endotracheal tube tip terminates approximately 4 cm above the carina, similar to prior. Enteric tube descends below the diaphragm with the tip excluded by inferior collimation. Cardiac silhouette is again moderately enlarged. Mediastinal contours are grossly within normal limits for portable technique and patient rightward rotation. Mildly decreased lung volumes. Bibasilar horizontal linear subsegmental atelectasis is similar to prior. Moderate bilateral interstitial thickening with mild bilateral lower lung patchy airspace opacities are similar to most recent 01/20/2023 prior. No large pleural effusion is seen. No pneumothorax. Moderate multilevel degenerative disc changes of the thoracic spine. IMPRESSION: 1. Moderate bilateral interstitial thickening with mild bilateral lower lung patchy airspace opacities is similar to most recent 01/20/2023 prior. Again this likely represents a combination of moderate interstitial and mild alveolar pulmonary edema. 2. Support apparatus in appropriate position as above. Electronically Signed   By: Neita Garnet M.D.   On: 01/21/2023 08:37   DG Chest Port 1 View  Result Date: 01/20/2023 CLINICAL DATA:  ET tube EXAM: PORTABLE CHEST 1 VIEW COMPARISON:  01/19/2023 FINDINGS: Endotracheal tube, right dialysis catheter, NG tube remain in place,  unchanged. Mild cardiomegaly. Increasing perihilar and lower lobe airspace opacities, favor edema. Suspect layering left pleural effusion. No acute bony abnormality. IMPRESSION: Cardiomegaly with increasing perihilar and lower lobe airspace disease, favor edema. Support devices stable. Electronically Signed   By: Charlett Nose M.D.   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 Yes Co-Morbidities requiring supervision/potential complications:  -RIght shoulder  weakness, ESRD, PNA on IV abx Due to bladder management, bowel management, safety, skin/wound care, disease management, medication administration, pain management, and patient education, does the  patient require 24 hr/day rehab nursing? Yes Does the patient require coordinated care of a physician, rehab nurse, therapy disciplines of PT, OT, SLP to address physical and functional deficits in the context of the above medical diagnosis(es)? Yes Addressing deficits in the following areas: balance, endurance, locomotion, strength, transferring, bowel/bladder control, bathing, dressing, feeding, grooming, toileting, cognition, and psychosocial support Can the patient actively participate in an intensive therapy program of at least 3 hrs of therapy per day at least 5 days per week? Yes The potential for patient to make measurable gains while on inpatient rehab is good Anticipated functional outcomes upon discharge from inpatient rehab are modified independent  with PT, modified independent with OT, modified independent with SLP. Estimated rehab length of stay to reach the above functional goals is: 10-14d Anticipated discharge destination: Home Overall Rehab/Functional Prognosis: good  POST ACUTE RECOMMENDATIONS: This patient's condition is  appropriate for continued rehabilitative care in the following setting: CIR Patient has agreed to participate in recommended program. Potentially Note that insurance prior authorization may be required for reimbursement for recommended care.  Comment:per [place] law no driving until seizure free x 6 mo and cleared by neuro  Will need help for HD post hospital , also won't be able to drive to OP HD center  MEDICAL RECOMMENDATIONS: CT or MRI of Cervical spine to assess Right proximal UE weakness, mainly deltoid weakness , no sensory deficits seen on exam , if negative this may be a brachial plexus injury , recent CT head - for acute CVA   I have personally performed a face to face diagnostic evaluation of this patient. Additionally, I have examined the patient's medical record including any pertinent labs and radiographic images. If the physician assistant has documented in this note, I have reviewed and edited or otherwise concur with the physician assistant's documentation.  Thanks,  Erick Colace, MD 01/21/2023

## 2023-01-21 NOTE — Progress Notes (Signed)
Did some walking with PT today, had a shuffling gait, reported some blurry vision, therapist noted right arm weakness. On evaluation he reports some right shoulder pain. Blurred vision has resolved.  He is sitting upright in recliner. He is alert and oriented. His speech is normal-sounding. Object-naming is appropriate. Grip strength equal bilaterally. Globally weak, but unable to hold R arm up against gravity. Equal foot dorsi/plantarflexion and hip flexion bilaterally. Shuffling gait noted with PT. Vision grossly intact, doesn't cooperate for effective visual field testing. EOMI, pupils equal and reactive, tongue midline with trauma noted on right side. Symmetric palatal elevation. No facial asymmetry. Shoulders equal-appearing by visual inspection, no obvious deformity but some crepitus noted to palpation on R shoulder.  Neurologically intact, still with some sequelae of sedation but no focal deficits. Wonder about shoulder injury given his seizures, strength limitation is proximal RUE, distal strength intact. Seems like limited due to pain. Doubt CNS process, will check shoulder XR for dislocation.  Stable for transfer to floor.

## 2023-01-21 NOTE — Procedures (Signed)
Extubation Procedure Note  Patient Details:   Name: [name] [name] DOB: 1980/12/23 MRN: [id number]   Airway Documentation:    Vent end date: (not recorded) Vent end time: (not recorded)   Evaluation  O2 sats: stable throughout Complications: No apparent complications Patient did tolerate procedure well. Bilateral Breath Sounds: Clear, Diminished   Yes  Icis Budreau 01/21/2023, 8:41 AM Extubated to 4LPM [facility] at this time.

## 2023-01-22 DIAGNOSIS — R569 Unspecified convulsions: Secondary | ICD-10-CM | POA: Diagnosis not present

## 2023-01-22 LAB — GLUCOSE, CAPILLARY
Glucose-Capillary: 110 mg/dL — ABNORMAL HIGH (ref 70–99)
Glucose-Capillary: 68 mg/dL — ABNORMAL LOW (ref 70–99)
Glucose-Capillary: 72 mg/dL (ref 70–99)
Glucose-Capillary: 73 mg/dL (ref 70–99)
Glucose-Capillary: 89 mg/dL (ref 70–99)
Glucose-Capillary: 92 mg/dL (ref 70–99)

## 2023-01-22 MED ORDER — DIPHENHYDRAMINE HCL 25 MG PO CAPS
25.0000 mg | ORAL_CAPSULE | Freq: Four times a day (QID) | ORAL | Status: DC | PRN
  Administered 2023-01-22 – 2023-02-13 (×15): 25 mg via ORAL
  Filled 2023-01-22 (×15): qty 1

## 2023-01-22 MED ORDER — CARVEDILOL 6.25 MG PO TABS
6.2500 mg | ORAL_TABLET | Freq: Two times a day (BID) | ORAL | Status: DC
  Administered 2023-01-22 – 2023-01-27 (×10): 6.25 mg via ORAL
  Filled 2023-01-22 (×3): qty 1
  Filled 2023-01-22: qty 2
  Filled 2023-01-22 (×4): qty 1
  Filled 2023-01-22: qty 2
  Filled 2023-01-22 (×3): qty 1

## 2023-01-22 MED ORDER — DIPHENHYDRAMINE HCL 25 MG PO CAPS: 25.0000 mg | ORAL_CAPSULE | Freq: Four times a day (QID) | ORAL | Status: DC | PRN

## 2023-01-22 MED ORDER — DIPHENHYDRAMINE HCL 25 MG PO CAPS
25.0000 mg | ORAL_CAPSULE | Freq: Once | ORAL | Status: AC | PRN
  Administered 2023-01-22: 25 mg via ORAL
  Filled 2023-01-22: qty 1

## 2023-01-22 MED ORDER — SEVELAMER CARBONATE 800 MG PO TABS
1600.0000 mg | ORAL_TABLET | Freq: Three times a day (TID) | ORAL | Status: DC
  Administered 2023-01-22 – 2023-01-29 (×21): 1600 mg via ORAL
  Filled 2023-01-22 (×20): qty 2

## 2023-01-22 MED ORDER — CINACALCET HCL 30 MG PO TABS
60.0000 mg | ORAL_TABLET | Freq: Every day | ORAL | Status: DC
  Administered 2023-01-22 – 2023-02-13 (×23): 60 mg via ORAL
  Filled 2023-01-22 (×23): qty 2

## 2023-01-22 NOTE — Progress Notes (Signed)
Inpatient Rehab Coordinator Note:  I spoke with patient over the phone to discuss CIR recommendations and goals/expectations of CIR stay.  We reviewed 3 hrs/day of therapy, physician follow up, and average length of stay 2 weeks (dependent upon progress) with goals of modified independence.  Typically he takes the bus to/from HD or he has a friend that may pick him up some times.  He does not have any family in the area to provide assist.  We discussed need for insurance prior auth and I will start that request today.    Estill Dooms, PT, DPT Admissions Coordinator [phone] 01/22/23  12:13 PM

## 2023-01-22 NOTE — Progress Notes (Signed)
  [facility] Progress Note   Subjective:   alert and interacting   Objective Vitals:   01/22/23 0451 01/22/23 0500 01/22/23 0600 01/22/23 0721  BP: (!) 173/106 (!) 166/102 (!) 146/98   Pulse:  81 79   Resp:  (!) 23 (!) 21   Temp:    98.4 F (36.9 C)  TempSrc:    Oral  SpO2:  93% 97%   Weight:      Height:       Physical Exam General: alert, BP's good Heart: SB Lungs: CTA bilat Abdomen: soft Extremities: trace edema Dialysis Access: [facility] c/d/i    Dialysis Orders: [facility] MWF 4h   400/800 112.8kg  2/2 bath  TDC  Heparin none - Mircera 120 mcg IV q 2 weeks (last dose 01/16/2023) - Venofer 50 mg IV q week   Assessment/Plan: Seizures: Had not been taking OP meds. Found down at [employer]. Management per primary.  Acute hypoxic respiratory failure: volume + possible aspiration PNA. Had extra HD 8/04 for volume w/ 2.5 L taken off. Extubated 8/05.   Volume - looks stable, +2kg today. Max UF w/ HD today.  Hypotension:  TTE unrevealing, poss sepsis with asp PNA - on abx per primary. Off pressors now, going to floor bed.   [facility] -  MWF HD. Had extra HD 8/04 yesterday. HD today off schedule and cont TTS this week.   Anemia  - HGB 9.0 > 7.7 this AM per CBC. Recent ESA dose. Follow HGB.  Darbe started here at 60 mcg sq weekly.   Metabolic bone disease -  Corrected Ca+ 10 PO4 elevated. Start renvela as binder and resume sensipar 60mg  every day.   Nutrition - Albumin 3.0 Renal diet/protein supplements when able to eat.   SLE- not following with Rheumatology  Chronic Thrombocytopenia-has not kept appts with hematology.  Vinson Moselle  MD  [facility] 01/22/2023, 9:56 AM  Recent Labs  Lab 01/21/23 0345 01/21/23 1831 01/22/23 0502  HGB 8.5*  --  8.9*  ALBUMIN 2.9*  --  3.0*  CALCIUM 9.2  --  9.1  PHOS 7.8* 7.9* 9.5*  CREATININE 9.54*  --  12.79*  K 4.0  --  3.8    Inpatient medications:  amLODipine  10 mg Oral Daily   Chlorhexidine Gluconate Cloth  6 each Topical  Q0600   darbepoetin (ARANESP) injection - DIALYSIS  60 mcg Subcutaneous Q Mon-1800   heparin injection (subcutaneous)  5,000 Units Subcutaneous Q8H   levETIRAcetam  500 mg Oral BID   multivitamin  1 tablet Oral QHS    sodium chloride 10 mL/hr at 01/22/23 0600   ampicillin-sulbactam (UNASYN) IV Stopped (01/22/23 0507)   anticoagulant sodium citrate     acetaminophen, alteplase, anticoagulant sodium citrate, docusate sodium, heparin, hydrALAZINE, lidocaine (PF), lidocaine-prilocaine, LORazepam, mouth rinse, pentafluoroprop-tetrafluoroeth, polyethylene glycol
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
## 2023-01-22 NOTE — Consult Note (Signed)
Reason for Consult: Right shoulder pain and weakness Referring Physician: Dr. Rulon Eisenmenger is an 42 y.o. male.  HPI: The patient is a 42 year old black male with end-stage renal disease on hemodialysis who was admitted with a seizure.  He was intubated for a few days.  He woke up with some pain and weakness in his right shoulder.  A cervical MRI was obtained which demonstrated a herniated disc.  Shoulder x-rays were obtained which demonstrated some degenerative changes.  A neurosurgical consultation was requested.  Presently the patient is alert and pleasant.  He complains of right shoulder pain with range of motion.  He denies neck pain, numbness, tingling, etc.  History reviewed. No pertinent past medical history.  History reviewed. No pertinent surgical history.  History reviewed. No pertinent family history.  Social History:  has no history on file for tobacco use, alcohol use, and drug use.  Allergies:  Allergies  Allergen Reactions   Cetirizine & Related Swelling    Medications: I have reviewed the patient's current medications. Prior to Admission:  Medications Prior to Admission  Medication Sig Dispense Refill Last Dose   amLODipine (NORVASC) 10 MG tablet Take 10 mg by mouth at bedtime.      carvedilol (COREG) 6.25 MG tablet Take 6.25 mg by mouth 2 (two) times daily.      levETIRAcetam (KEPPRA) 1000 MG tablet Take 1,000 mg by mouth daily.      SENSIPAR 60 MG tablet Take by mouth.      Scheduled:  amLODipine  10 mg Oral Daily   Chlorhexidine Gluconate Cloth  6 each Topical Q0600   Chlorhexidine Gluconate Cloth  6 each Topical Q0600   darbepoetin (ARANESP) injection - DIALYSIS  60 mcg Subcutaneous Q Mon-1800   heparin injection (subcutaneous)  5,000 Units Subcutaneous Q8H   levETIRAcetam  500 mg Oral BID   multivitamin  1 tablet Oral QHS   Continuous:  sodium chloride 10 mL/hr at 01/22/23 0600   ampicillin-sulbactam (UNASYN) IV Stopped (01/22/23 0507)    anticoagulant sodium citrate     KGM:[id number], alteplase, anticoagulant sodium citrate, docusate sodium, heparin, hydrALAZINE, lidocaine (PF), lidocaine-prilocaine, LORazepam, mouth rinse, pentafluoroprop-tetrafluoroeth, polyethylene glycol Anti-infectives (From admission, onward)    Start     Dose/Rate Route Frequency Ordered Stop   01/20/23 1745  ampicillin-sulbactam (UNASYN) 1.5 g in sodium chloride 0.9 % 100 mL IVPB        1.5 g 200 mL/hr over 30 Minutes Intravenous Every 12 hours 01/20/23 1019 01/23/23 2359   01/19/23 0530  ampicillin-sulbactam (UNASYN) 1.5 g in sodium chloride 0.9 % 100 mL IVPB  Status:  Discontinued        1.5 g 200 mL/hr over 30 Minutes Intravenous Every 12 hours 01/19/23 0445 01/20/23 1019   01/18/23 2230  vancomycin (VANCOCIN) IVPB 1000 mg/200 mL premix  Status:  Discontinued        1,000 mg 200 mL/hr over 60 Minutes Intravenous  Once 01/18/23 2218 01/18/23 2222   01/18/23 2230  metroNIDAZOLE (FLAGYL) IVPB 500 mg        500 mg 100 mL/hr over 60 Minutes Intravenous  Once 01/18/23 2218 01/19/23 0047   01/18/23 2230  ceFEPIme (MAXIPIME) 2 g in sodium chloride 0.9 % 100 mL IVPB        2 g 200 mL/hr over 30 Minutes Intravenous  Once 01/18/23 2222 01/18/23 2323   01/18/23 2230  vancomycin (VANCOREADY) IVPB 2000 mg/400 mL        2,000 mg  200 mL/hr over 120 Minutes Intravenous  Once 01/18/23 2222 01/19/23 0415        Results for orders placed or performed during the hospital encounter of 01/18/23 (from the past 48 hour(s))  Glucose, capillary     Status: None   Collection Time: 01/20/23  7:44 AM  Result Value Ref Range   Glucose-Capillary 87 70 - 99 mg/dL    Comment: Glucose reference range applies only to samples taken after fasting for at least 8 hours.  Magnesium     Status: None   Collection Time: 01/20/23  7:18 PM  Result Value Ref Range   Magnesium 1.9 1.7 - 2.4 mg/dL    Comment: Performed at [facility], [address] [place], [place]  [address]  Phosphorus     Status: Abnormal   Collection Time: 01/20/23  7:18 PM  Result Value Ref Range   Phosphorus 6.1 (H) 2.5 - 4.6 mg/dL    Comment: Performed at [facility], [address], [place], [place] [address]  Glucose, capillary     Status: None   Collection Time: 01/20/23  7:28 PM  Result Value Ref Range   Glucose-Capillary 79 70 - 99 mg/dL    Comment: Glucose reference range applies only to samples taken after fasting for at least 8 hours.  Glucose, capillary     Status: None   Collection Time: 01/20/23 11:45 PM  Result Value Ref Range   Glucose-Capillary 80 70 - 99 mg/dL    Comment: Glucose reference range applies only to samples taken after fasting for at least 8 hours.  Glucose, capillary     Status: Abnormal   Collection Time: 01/21/23  3:25 AM  Result Value Ref Range   Glucose-Capillary 63 (L) 70 - 99 mg/dL    Comment: Glucose reference range applies only to samples taken after fasting for at least 8 hours.  Glucose, capillary     Status: None   Collection Time: 01/21/23  3:28 AM  Result Value Ref Range   Glucose-Capillary 77 70 - 99 mg/dL    Comment: Glucose reference range applies only to samples taken after fasting for at least 8 hours.  CBC     Status: Abnormal   Collection Time: 01/21/23  3:45 AM  Result Value Ref Range   WBC 6.1 4.0 - 10.5 K/uL   RBC 2.76 (L) 4.22 - 5.81 MIL/uL   Hemoglobin 8.5 (L) 13.0 - 17.0 g/dL   HCT 09.8 (L) 11.9 - 14.7 %   MCV 97.1 80.0 - 100.0 fL   MCH 30.8 26.0 - 34.0 pg   MCHC 31.7 30.0 - 36.0 g/dL   RDW 82.9 56.2 - 13.0 %   Platelets 100 (L) 150 - 400 K/uL    Comment: Immature Platelet Fraction may be clinically indicated, consider ordering this additional test [id number]    nRBC 0.0 0.0 - 0.2 %    Comment: Performed at [facility], [address], [place], [place] [address]  Renal function panel     Status: Abnormal   Collection Time: 01/21/23  3:45 AM  Result Value Ref Range   Sodium 134 (L) 135 - 145  mmol/L   Potassium 4.0 3.5 - 5.1 mmol/L   Chloride 93 (L) 98 - 111 mmol/L   CO2 26 22 - 32 mmol/L   Glucose, Bld 79 70 - 99 mg/dL    Comment: Glucose reference range applies only to samples taken after fasting for at least 8  hours.   BUN 30 (H) 6 - 20 mg/dL   Creatinine, Ser 0.98 (H) 0.61 - 1.24 mg/dL   Calcium 9.2 8.9 - 11.9 mg/dL   Phosphorus 7.8 (H) 2.5 - 4.6 mg/dL   Albumin 2.9 (L) 3.5 - 5.0 g/dL   GFR, Estimated 6 (L) >60 mL/min    Comment: (NOTE) Calculated using the CKD-EPI Creatinine Equation (2021)    Anion gap 15 5 - 15    Comment: Performed at [facility], [address] [place], [place] [address]  Magnesium     Status: None   Collection Time: 01/21/23  3:45 AM  Result Value Ref Range   Magnesium 2.1 1.7 - 2.4 mg/dL    Comment: Performed at [facility], [address] [place], [place] [address]  Glucose, capillary     Status: None   Collection Time: 01/21/23  7:16 AM  Result Value Ref Range   Glucose-Capillary 81 70 - 99 mg/dL    Comment: Glucose reference range applies only to samples taken after fasting for at least 8 hours.  Glucose, capillary     Status: None   Collection Time: 01/21/23 11:25 AM  Result Value Ref Range   Glucose-Capillary 79 70 - 99 mg/dL    Comment: Glucose reference range applies only to samples taken after fasting for at least 8 hours.  Glucose, capillary     Status: None   Collection Time: 01/21/23  3:18 PM  Result Value Ref Range   Glucose-Capillary 73 70 - 99 mg/dL    Comment: Glucose reference range applies only to samples taken after fasting for at least 8 hours.  Magnesium     Status: None   Collection Time: 01/21/23  6:31 PM  Result Value Ref Range   Magnesium 2.2 1.7 - 2.4 mg/dL    Comment: Performed at [facility], [address] [place], [place] [address]  Phosphorus     Status: Abnormal   Collection Time: 01/21/23  6:31 PM  Result Value Ref Range   Phosphorus 7.9 (H) 2.5 - 4.6 mg/dL    Comment:  Performed at [facility], [address] [place], [place] [address]  Glucose, capillary     Status: Abnormal   Collection Time: 01/21/23  7:09 PM  Result Value Ref Range   Glucose-Capillary 60 (L) 70 - 99 mg/dL    Comment: Glucose reference range applies only to samples taken after fasting for at least 8 hours.  Glucose, capillary     Status: None   Collection Time: 01/21/23  8:13 PM  Result Value Ref Range   Glucose-Capillary 78 70 - 99 mg/dL    Comment: Glucose reference range applies only to samples taken after fasting for at least 8 hours.  Glucose, capillary     Status: None   Collection Time: 01/21/23 11:50 PM  Result Value Ref Range   Glucose-Capillary 81 70 - 99 mg/dL    Comment: Glucose reference range applies only to samples taken after fasting for at least 8 hours.  Glucose, capillary     Status: Abnormal   Collection Time: 01/22/23  3:37 AM  Result Value Ref Range   Glucose-Capillary 68 (L) 70 - 99 mg/dL    Comment: Glucose reference range applies only to samples taken after fasting for at least 8 hours.  Glucose, capillary     Status: None   Collection Time: 01/22/23  4:48 AM  Result Value Ref Range   Glucose-Capillary  89 70 - 99 mg/dL    Comment: Glucose reference range applies only to samples taken after fasting for at least 8 hours.  CBC     Status: Abnormal   Collection Time: 01/22/23  5:02 AM  Result Value Ref Range   WBC 7.2 4.0 - 10.5 K/uL   RBC 2.84 (L) 4.22 - 5.81 MIL/uL   Hemoglobin 8.9 (L) 13.0 - 17.0 g/dL   HCT 30.8 (L) 65.7 - 84.6 %   MCV 97.5 80.0 - 100.0 fL   MCH 31.3 26.0 - 34.0 pg   MCHC 32.1 30.0 - 36.0 g/dL   RDW 96.2 95.2 - 84.1 %   Platelets 109 (L) 150 - 400 K/uL   nRBC 0.0 0.0 - 0.2 %    Comment: Performed at [facility], [address], [place], [place] [address]  Renal function panel     Status: Abnormal   Collection Time: 01/22/23  5:02 AM  Result Value Ref Range   Sodium 134 (L) 135 - 145 mmol/L   Potassium 3.8 3.5 -  5.1 mmol/L   Chloride 93 (L) 98 - 111 mmol/L   CO2 23 22 - 32 mmol/L   Glucose, Bld 100 (H) 70 - 99 mg/dL    Comment: Glucose reference range applies only to samples taken after fasting for at least 8 hours.   BUN 46 (H) 6 - 20 mg/dL   Creatinine, Ser 10.27 (H) 0.61 - 1.24 mg/dL   Calcium 9.1 8.9 - 25.3 mg/dL   Phosphorus 9.5 (H) 2.5 - 4.6 mg/dL   Albumin 3.0 (L) 3.5 - 5.0 g/dL   GFR, Estimated 4 (L) >60 mL/min    Comment: (NOTE) Calculated using the CKD-EPI Creatinine Equation (2021)    Anion gap 18 (H) 5 - 15    Comment: Performed at [facility], [address] [place], [place] [address]  Glucose, capillary     Status: None   Collection Time: 01/22/23  7:21 AM  Result Value Ref Range   Glucose-Capillary 72 70 - 99 mg/dL    Comment: Glucose reference range applies only to samples taken after fasting for at least 8 hours.    MR CERVICAL SPINE WO CONTRAST  Result Date: 01/21/2023 CLINICAL DATA:  Right cervical radiculopathy EXAM: MRI CERVICAL SPINE WITHOUT CONTRAST TECHNIQUE: Multiplanar, multisequence MR imaging of the cervical spine was performed. No intravenous contrast was administered. COMPARISON:  None Available. FINDINGS: Alignment: Physiologic. Vertebrae: No fracture, evidence of discitis, or bone lesion. Diffuse low T1/T2-weighted signal within the bone marrow. Cord: Mild hyperintense T2-weighted signal within the spinal cord at the C5 level. There is a small amount of ventral epidural fluid at the C6-T1 levels, approximately 2 mm thick. Posterior Fossa, vertebral arteries, paraspinal tissues: Negative Disc levels: C1-2: Unremarkable. C2-3: Normal disc space and facet joints. There is no spinal canal stenosis. No neural foraminal stenosis. C3-4: Normal disc space and facet joints. There is no spinal canal stenosis. No neural foraminal stenosis. C4-5: Small central disc protrusion. Mild spinal canal stenosis. No neural foraminal stenosis. C5-6: Large central disc extrusion with  superior migration. Severe spinal canal stenosis. There is compression of the spinal cord with right asymmetric signal change. No neural foraminal stenosis. C6-7: Normal disc space and facet joints. There is no spinal canal stenosis. No neural foraminal stenosis. C7-T1: Normal disc space and facet joints. There is no spinal canal stenosis. No neural foraminal stenosis. IMPRESSION: 1. Large central disc extrusion with superior migration at C5-6  with severe spinal canal stenosis and compression of the spinal cord. Signal changes of compressive myelopathy at the C5 level. 2. Small amount of ventral epidural fluid at the C6-T1 levels, approximately 2 mm thick. This may be a small epidural hematoma/effusion secondary to acute disc herniation. 3. Diffuse low T1/T2-weighted signal within the bone marrow may be related to anemia or a myeloproliferative disorder. Critical Value/emergent results were called by telephone at the time of interpretation on 01/21/2023 at 11:02 pm to Dr. Marlane Mingle, who verbally acknowledged these results. Electronically Signed   By: Deatra Robinson M.D.   On: 01/21/2023 23:03   DG Shoulder Right Port  Result Date: 01/21/2023 CLINICAL DATA:  Right shoulder pain and right arm weakness. Seizure. EXAM: RIGHT SHOULDER - 1 VIEW COMPARISON:  12/26/2016 and chest radiograph from 01/21/2023 FINDINGS: Indistinct calcific fragments in the axillary pouch region suspicious for free osteochondral fragments. Cannot excluded prior bony Bankart injury. The frontal view obtained is in substantial internal rotation. AC joint alignment not substantially changed from 02/13/2013. No glenohumeral malalignment on the transscapular projection. Right IJ dual lumen catheter noted with tip in the right atrium. Previous endotracheal and nasogastric tubes have been removed. IMPRESSION: 1. Indistinct calcific fragments in the axillary pouch region suspicious for free osteochondral fragments. Cannot exclude prior bony Bankart injury.  2. No current glenohumeral malalignment. 3. Right IJ dual lumen catheter with tip in the right atrium. Electronically Signed   By: Gaylyn Rong M.D.   On: 01/21/2023 17:27   DG Chest Port 1 View  Result Date: 01/21/2023 CLINICAL DATA:  Respiratory failure. EXAM: PORTABLE CHEST 1 VIEW COMPARISON:  AP chest 01/20/2023, 01/19/2023 (multiple studies) FINDINGS: Right internal jugular dual-lumen central venous catheter tips again overlie the superior right atrium. Endotracheal tube tip terminates approximately 4 cm above the carina, similar to prior. Enteric tube descends below the diaphragm with the tip excluded by inferior collimation. Cardiac silhouette is again moderately enlarged. Mediastinal contours are grossly within normal limits for portable technique and patient rightward rotation. Mildly decreased lung volumes. Bibasilar horizontal linear subsegmental atelectasis is similar to prior. Moderate bilateral interstitial thickening with mild bilateral lower lung patchy airspace opacities are similar to most recent 01/20/2023 prior. No large pleural effusion is seen. No pneumothorax. Moderate multilevel degenerative disc changes of the thoracic spine. IMPRESSION: 1. Moderate bilateral interstitial thickening with mild bilateral lower lung patchy airspace opacities is similar to most recent 01/20/2023 prior. Again this likely represents a combination of moderate interstitial and mild alveolar pulmonary edema. 2. Support apparatus in appropriate position as above. Electronically Signed   By: Neita Garnet M.D.   On: 01/21/2023 08:37    ROS: As above Blood pressure (!) 146/98, pulse 79, temperature 98.4 F (36.9 C), temperature source Oral, resp. rate (!) 21, height 6\' 1"  (1.854 m), weight 114.3 kg, SpO2 97%. Estimated body mass index is 33.25 kg/m as calculated from the following:   Height as of this encounter: 6\' 1"  (1.854 m).   Weight as of this encounter: 114.3 kg.  Physical Exam  General: An  alert and pleasant 42 year old black male in no apparent distress who complains of right shoulder pain with range of motion.  HEENT: Normocephalic, extraocular muscles intact  Neck: Unremarkable.  He has a fairly normal range of motion.  Spurling's testing was negative Lhermitte sign was not present.  Thorax: Symmetric  Abdomen: Soft  Extremities: The patient has pain with both active and passive range of motion of the right shoulder without obvious deformities.  Neurologic exam: The patient is alert and oriented.  Cranial nerves II through XII were examined bilaterally and were grossly normal.  Vision and hearing were grossly normal.  The patient's motor strength is normal except he seems to have some mild weakness of bilateral handgrip and weakness in his right deltoid/shoulder.  He denies numbness and tingling.  He is hyperreflexic in his bilateral quadricep and gastrocnemius.  He has sustained ankle clonus.  Imaging studies I reviewed the patient's cervical MRI performed yesterday.  It demonstrates a central her disc versus prior at C5-6 causing moderate to severe stenosis and some cord signal change.  Assessment/Plan: Cervical spondylosis/herniated disc, cervical stenosis, cervical myelopathy: I have discussed the situation with the patient.  He likely has a chronic cervical myelopathy since he is hyperreflexic and has ankle clonus.  I recommend he have a C5-6 anterior cervicectomy fusion and plating at some point.  This does not need to be done acutely.  I think the pain in his right shoulder is likely coming from the shoulder itself.  Hopefully it will improve with time.  [name] 01/22/2023, 7:28 AM

## 2023-01-22 NOTE — Progress Notes (Addendum)
[name] [name]  ZOX:[id number] DOB: [birth year] DOA: 01/18/2023 PCP: No primary care provider on file.    Brief Narrative:  42 year old with a history of ESRD on HD MWF via R tunneled IJ dialysis catheter, seizure disorder, lupus nephritis, and HTN who was admitted to the ICU 01/18/2023 after he was 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 He required intubation at presentation and was admitted to the ICU.   Significant Events: 8/2 admitted after seizure with resultant hypoxic respiratory failure -intubation 8/3 HD initiated for volume overload -stopped due to hypotension 8/3 EEG noted diffuse slowing but no seizure or epileptogenicity 8/5 extubated -found to have right arm weakness/unable to lift right arm above shoulder 8/6 TRH assumed care - transferred to Med/Surg bed   Goals of Care:   Code Status: Full Code   DVT prophylaxis: heparin injection 5,000 Units Start: 01/19/23 1400 SCDs Start: 01/19/23 0022  Interim Hx: Afebrile.  Vital signs stable.  No acute events recorded overnight.  Working with physical therapy at the time of my evaluation.  Moving well.  Denies chest pain shortness of breath fevers or chills.  Is somewhat emotional.  Denies uncontrolled pain.  Assessment & Plan:  Seizure No recurrent events since admission -continue Keppra -suspected poor compliance with home AEDs  Aspiration pneumonitis -acute hypoxic respiratory failure Successfully extubated 8/5 -empiric Unasyn through 8/6 -no persisting clinical signs or symptoms of infection  Shock - septic versus sedation induced Required transient use of pressor support -resolved   ESRD on HD MWF Care as per Nephrology  Metabolic encephalopathy due to seizure activity and hypoxia Mental status slowly improving - cont to monitor w/ time   Right shoulder pain/weakness -cervical spondylosis/herniated disc with cervical  stenosis/myelopathy Cervical MRI reveals herniated disc -right shoulder x-rays suggest degenerative change -Neurosurgery has evaluated and feels he has a chronic cervical myelopathy -a C5-6 anterior cervicectomy fusion and plating has been recommended but is not felt to be needed acutely -outpatient follow-up with Neurosurgery will be indicated  Anemia of chronic kidney disease Hemoglobin stable - erythropoietin per Nephrology  SLE Not followed by Rheumatology  Chronic thrombocytopenia Has not kept scheduled appointments with Hematology -platelet count stable  HTN  BP poorly controlled - adjust medical tx and follow trend  DM has been ruled out    Family Communication: no family present at time of exam  Disposition: Anticipate CIR stay   Objective: Blood pressure (!) 146/98, pulse 79, temperature 98.4 F (36.9 C), temperature source Oral, resp. rate (!) 21, height 6\' 1"  (1.854 m), weight 114.3 kg, SpO2 97%.  Intake/Output Summary (Last 24 hours) at 01/22/2023 0803 Last data filed at 01/22/2023 0600 Gross per 24 hour  Intake 502.19 ml  Output --  Net 502.19 ml   Filed Weights   01/20/23 1238 01/20/23 1522 01/21/23 0223  Weight: 114.3 kg 112 kg 114.3 kg    Examination: General: No acute respiratory distress Lungs: Clear to auscultation bilaterally without wheezes or crackles Cardiovascular: Regular rate and rhythm without murmur  Abdomen: Nontender, nondistended, soft, bowel sounds positive, no rebound, no ascites, no appreciable mass Extremities: trace edema B LE   CBC: Recent Labs  Lab 01/18/23 2056 01/18/23 2131 01/19/23 0211 01/19/23 0343 01/19/23 1808 01/20/23 0514 01/21/23 0345 01/22/23 0502  WBC 6.8  --  10.6*   < > 7.4 6.1 6.1 7.2  NEUTROABS 4.4  --  9.3*  --  5.7  --   --   --  HGB 10.9*   < > 9.6*   < > 8.3* 7.7* 8.5* 8.9*  HCT 34.0*   < > 29.5*   < > 26.1* 24.5* 26.8* 27.7*  MCV 98.0  --  95.8   < > 96.7 96.8 97.1 97.5  PLT 102*  --  88*   < > 87* 93*  100* 109*   < > = values in this interval not displayed.   Basic Metabolic Panel: Recent Labs  Lab 01/20/23 0514 01/20/23 1918 01/21/23 0345 01/21/23 1831 01/22/23 0502  NA 137  --  134*  --  134*  K 3.8  --  4.0  --  3.8  CL 95*  --  93*  --  93*  CO2 27  --  26  --  23  GLUCOSE 93  --  79  --  100*  BUN 38*  --  30*  --  46*  CREATININE 11.48*  --  9.54*  --  12.79*  CALCIUM 9.0  --  9.2  --  9.1  MG  --  1.9 2.1 2.2  --   PHOS 8.2* 6.1* 7.8* 7.9* 9.5*   GFR: Estimated Creatinine Clearance: 9.9 mL/min (A) (by C-G formula based on SCr of 12.79 mg/dL (H)).   Scheduled Meds:  amLODipine  10 mg Oral Daily   Chlorhexidine Gluconate Cloth  6 each Topical Q0600   darbepoetin (ARANESP) injection - DIALYSIS  60 mcg Subcutaneous Q Mon-1800   heparin injection (subcutaneous)  5,000 Units Subcutaneous Q8H   levETIRAcetam  500 mg Oral BID   multivitamin  1 tablet Oral QHS   Continuous Infusions:  sodium chloride 10 mL/hr at 01/22/23 0600   ampicillin-sulbactam (UNASYN) IV Stopped (01/22/23 0507)   anticoagulant sodium citrate       LOS: 3 days   Lonia Blood, MD [facility] Office  [phone] Pager - Text Page per Loretha Stapler  If 7PM-7AM, please contact night-coverage per Amion 01/22/2023, 8:03 AM

## 2023-01-22 NOTE — Progress Notes (Signed)
Received patient in bed to unit.  Alert and oriented.  Informed consent signed and in chart.   TX duration: 2 hours and 56 min, patient clotted off and machine stopped early  Patient tolerated well.  Transported back to the room  Alert, without acute distress.  Hand-off given to patient's nurse.   Access used: Right Cath Access issues: None  Total UF removed: Medication(s) given: none    01/22/23 1713  Vitals  Temp 97.8 F (36.6 C)  BP (!) 164/90  MAP (mmHg) 109  BP Location Right Wrist  BP Method Automatic  Patient Position (if appropriate) Lying  Pulse Rate Source Monitor  Level of Consciousness  Level of Consciousness Alert  MEWS COLOR  MEWS Score Color Green  Oxygen Therapy  SpO2 100 %  O2 Device Nasal Cannula  O2 Flow Rate (L/min) 4 L/min  Patient Activity (if Appropriate) In bed  Pulse Oximetry Type Continuous  Pain Assessment  Pain Scale 0-10  Pain Score 0  PCA/Epidural/Spinal Assessment  Respiratory Pattern Regular;Unlabored  ECG Monitoring  Cardiac Rhythm NSR  MEWS Score  MEWS Temp 0  MEWS Systolic 0  MEWS Pulse 0  MEWS RR 0  MEWS LOC 0  MEWS Score 0    Stacie Glaze LPN [facility]

## 2023-01-23 DIAGNOSIS — R569 Unspecified convulsions: Secondary | ICD-10-CM | POA: Diagnosis not present

## 2023-01-23 LAB — GLUCOSE, CAPILLARY
Glucose-Capillary: 82 mg/dL (ref 70–99)
Glucose-Capillary: 74 mg/dL (ref 70–99)
Glucose-Capillary: 103 mg/dL — ABNORMAL HIGH (ref 70–99)
Glucose-Capillary: 94 mg/dL (ref 70–99)

## 2023-01-23 LAB — RENAL FUNCTION PANEL: BUN: 31 mg/dL — ABNORMAL HIGH (ref 6–20)

## 2023-01-23 LAB — CBC: MCH: 30.7 pg (ref 26.0–34.0)

## 2023-01-23 MED ORDER — NEPRO/CARBSTEADY PO LIQD
237.0000 mL | Freq: Two times a day (BID) | ORAL | Status: DC
  Administered 2023-01-23 – 2023-01-30 (×9): 237 mL via ORAL

## 2023-01-23 MED ORDER — CHLORHEXIDINE GLUCONATE CLOTH 2 % EX PADS
6.0000 | MEDICATED_PAD | Freq: Every day | CUTANEOUS | Status: DC
  Administered 2023-01-24: 6 via TOPICAL

## 2023-01-23 NOTE — Progress Notes (Signed)
Subjective: The patient is alert and pleasant.  He has no complaints.  Objective: Vital signs in last 24 hours: Temp:  [97.8 F (36.6 C)-98.5 F (36.9 C)] 98.1 F (36.7 C) (08/07 0718) Pulse Rate:  [70-102] 79 (08/07 0800) Resp:  [13-24] 19 (08/07 0800) BP: (125-191)/(83-126) 157/108 (08/07 0922) SpO2:  [86 %-100 %] 100 % (08/07 0800) Weight:  [107.8 kg-109 kg] 107.8 kg (08/07 0500) Estimated body mass index is 31.35 kg/m as calculated from the following:   Height as of this encounter: 6\' 1"  (1.854 m).   Weight as of this encounter: 107.8 kg.   Intake/Output from previous day: 08/06 0701 - 08/07 0700 In: 166.3 [I.V.:41.4; IV Piggyback:124.9] Out: 10393.1  Intake/Output this shift: Total I/O In: 115 [I.V.:15; IV Piggyback:100] Out: -   Physical exam the patient is alert and oriented.  His strength is grossly normal except his right shoulder is weak without change.  Lab Results: Recent Labs    01/22/23 0502 01/23/23 0433  WBC 7.2 5.9  HGB 8.9* 10.1*  HCT 27.7* 31.4*  PLT 109* 100*   BMET Recent Labs    01/22/23 0502 01/23/23 0433  NA 134* 135  K 3.8 3.9  CL 93* 95*  CO2 23 23  GLUCOSE 100* 86  BUN 46* 31*  CREATININE 12.79* 10.20*  CALCIUM 9.1 9.2    Studies/Results: MR CERVICAL SPINE WO CONTRAST  Result Date: 01/21/2023 CLINICAL DATA:  Right cervical radiculopathy EXAM: MRI CERVICAL SPINE WITHOUT CONTRAST TECHNIQUE: Multiplanar, multisequence MR imaging of the cervical spine was performed. No intravenous contrast was administered. COMPARISON:  None Available. FINDINGS: Alignment: Physiologic. Vertebrae: No fracture, evidence of discitis, or bone lesion. Diffuse low T1/T2-weighted signal within the bone marrow. Cord: Mild hyperintense T2-weighted signal within the spinal cord at the C5 level. There is a small amount of ventral epidural fluid at the C6-T1 levels, approximately 2 mm thick. Posterior Fossa, vertebral arteries, paraspinal tissues: Negative Disc  levels: C1-2: Unremarkable. C2-3: Normal disc space and facet joints. There is no spinal canal stenosis. No neural foraminal stenosis. C3-4: Normal disc space and facet joints. There is no spinal canal stenosis. No neural foraminal stenosis. C4-5: Small central disc protrusion. Mild spinal canal stenosis. No neural foraminal stenosis. C5-6: Large central disc extrusion with superior migration. Severe spinal canal stenosis. There is compression of the spinal cord with right asymmetric signal change. No neural foraminal stenosis. C6-7: Normal disc space and facet joints. There is no spinal canal stenosis. No neural foraminal stenosis. C7-T1: Normal disc space and facet joints. There is no spinal canal stenosis. No neural foraminal stenosis. IMPRESSION: 1. Large central disc extrusion with superior migration at C5-6 with severe spinal canal stenosis and compression of the spinal cord. Signal changes of compressive myelopathy at the C5 level. 2. Small amount of ventral epidural fluid at the C6-T1 levels, approximately 2 mm thick. This may be a small epidural hematoma/effusion secondary to acute disc herniation. 3. Diffuse low T1/T2-weighted signal within the bone marrow may be related to anemia or a myeloproliferative disorder. Critical Value/emergent results were called by telephone at the time of interpretation on 01/21/2023 at 11:02 pm to Dr. Marlane Mingle, who verbally acknowledged these results. Electronically Signed   By: Deatra Robinson M.D.   On: 01/21/2023 23:03   DG Shoulder Right Port  Result Date: 01/21/2023 CLINICAL DATA:  Right shoulder pain and right arm weakness. Seizure. EXAM: RIGHT SHOULDER - 1 VIEW COMPARISON:  12/26/2016 and chest radiograph from 01/21/2023 FINDINGS: Indistinct calcific fragments  in the axillary pouch region suspicious for free osteochondral fragments. Cannot excluded prior bony Bankart injury. The frontal view obtained is in substantial internal rotation. AC joint alignment not substantially  changed from 02/13/2013. No glenohumeral malalignment on the transscapular projection. Right IJ dual lumen catheter noted with tip in the right atrium. Previous endotracheal and nasogastric tubes have been removed. IMPRESSION: 1. Indistinct calcific fragments in the axillary pouch region suspicious for free osteochondral fragments. Cannot exclude prior bony Bankart injury. 2. No current glenohumeral malalignment. 3. Right IJ dual lumen catheter with tip in the right atrium. Electronically Signed   By: Gaylyn Rong M.D.   On: 01/21/2023 17:27    Assessment/Plan: C5-6 herniated disc, spinal stenosis, cervical myelopathy: I have again discussed this with the patient.  I have recommended a C5-6 anterior cervicectomy fusion and plating.  I do not think this needs to be done emergently.  I have asked him to follow-up with me in the office so we can review his MRI and further discuss the surgery, show him models, etc.  I have answered all his questions.  I will sign off.  Please have him follow-up with me in the office in a week or 2.  LOS: 4 days     [name] [name] 01/23/2023, 10:13 AM     Patient ID: [name] [name], male   DOB: [birth year], 42 y.o.   MRN: [id number]

## 2023-01-23 NOTE — Progress Notes (Signed)
PROGRESS NOTE    [name] [name]  ZOX:[id number] DOB: 12-28-1980 DOA: 01/18/2023 PCP: No primary care provider on file.  Chief Complaint  Patient presents with   Seizures    Brief Narrative:   42 year old with Larisa Lanius history of ESRD on HD MWF via R tunneled IJ dialysis catheter, seizure disorder, lupus nephritis, and HTN who was admitted to the ICU 01/18/2023 after he was found down at [employer] following Manfred Laspina seizure with subsequent altered mental status and Timea Breed suspected aspiration event. He required intubation at presentation and was admitted to the ICU.   Significant Events: 8/2 admitted after seizure with resultant hypoxic respiratory failure -intubation 8/3 HD initiated for volume overload -stopped due to hypotension 8/3 EEG noted diffuse slowing but no seizure or epileptogenicity 8/5 extubated -found to have right arm weakness/unable to lift right arm above shoulder 8/6 TRH assumed care - transferred to Med/Surg bed   Assessment & Plan:   Principal Problem:   Seizure [facility]) Active Problems:   Acute respiratory failure with hypoxia (HCC)  Seizure No recurrent events since admission -continue Keppra -suspected poor compliance with home AEDs Of note, this chart marked for merged, no MRI in this chart, but MRI 07/2022 in chart to be merged motion limited, but without acute abnormalities, extensive periventricular T2/FLAIR hyperintense signal abnormality which correlates with the regions of hypodensity seen on CT brain dated 04/29/2019. This is favored to represent changes related to Mayo Faulk combination of chronic frontal parietal cortical infarcts superimposed on Ashunti Schofield background of severe chronic microvascular ischemic.  Doesn't appear he's been to see an outpatient neurologist.    Per [facility] statutes, patients with seizures are not allowed to drive until  they have been seizure-free for six months. Use caution when using heavy equipment or power tools. Avoid working on ladders or  at heights. Take showers instead of baths. Ensure the water temperature is not too high on the home water heater. Do not go swimming alone. When caring for infants or small children, sit down when holding, feeding, or changing them to minimize risk of injury to the child in the event you have Terris Germano seizure. Also, Maintain good sleep hygiene. Avoid alcohol.    Aspiration pneumonitis -acute hypoxic respiratory failure Successfully extubated 8/5 -empiric Unasyn through 8/6 -no persisting clinical signs or symptoms of infection   Shock - septic versus sedation induced Required transient use of pressor support -resolved    ESRD on HD MWF Care as per Nephrology   Metabolic encephalopathy due to seizure activity and hypoxia Mental status slowly improving - cont to monitor w/ time  Delirium precautions   Right shoulder pain/weakness -cervical spondylosis/herniated disc with cervical stenosis/myelopathy Cervical MRI reveals herniated disc -right shoulder x-rays suggest degenerative change -Neurosurgery has evaluated and feels he has Lyndal Reggio chronic cervical myelopathy -Gracielynn Birkel C5-6 anterior cervicectomy fusion and plating has been recommended but is not felt to be needed acutely -outpatient follow-up with Neurosurgery will be indicated   Anemia of chronic kidney disease Hemoglobin stable - erythropoietin per Nephrology   SLE Not followed by Rheumatology   Chronic thrombocytopenia Has not kept scheduled appointments with Hematology -platelet count stable   HTN  BP poorly controlled - adjust medical tx and follow trend   DM has been ruled out      DVT prophylaxis: heparin Code Status: full Family Communication: none Disposition:   Status is: Inpatient Remains inpatient appropriate because: need for continued inpatient care   Consultants:  Neurosurgery PCCM  Procedures:  intubation  Antimicrobials:  Anti-infectives (From admission, onward)    Start     Dose/Rate Route Frequency Ordered Stop    01/20/23 1745  ampicillin-sulbactam (UNASYN) 1.5 g in sodium chloride 0.9 % 100 mL IVPB        1.5 g 200 mL/hr over 30 Minutes Intravenous Every 12 hours 01/20/23 1019 01/23/23 2359   01/19/23 0530  ampicillin-sulbactam (UNASYN) 1.5 g in sodium chloride 0.9 % 100 mL IVPB  Status:  Discontinued        1.5 g 200 mL/hr over 30 Minutes Intravenous Every 12 hours 01/19/23 0445 01/20/23 1019   01/18/23 2230  vancomycin (VANCOCIN) IVPB 1000 mg/200 mL premix  Status:  Discontinued        1,000 mg 200 mL/hr over 60 Minutes Intravenous  Once 01/18/23 2218 01/18/23 2222   01/18/23 2230  metroNIDAZOLE (FLAGYL) IVPB 500 mg        500 mg 100 mL/hr over 60 Minutes Intravenous  Once 01/18/23 2218 01/19/23 0047   01/18/23 2230  ceFEPIme (MAXIPIME) 2 g in sodium chloride 0.9 % 100 mL IVPB        2 g 200 mL/hr over 30 Minutes Intravenous  Once 01/18/23 2222 01/18/23 2323   01/18/23 2230  vancomycin (VANCOREADY) IVPB 2000 mg/400 mL        2,000 mg 200 mL/hr over 120 Minutes Intravenous  Once 01/18/23 2222 01/19/23 0415       Subjective: No CP, HA, SOB  Objective: Vitals:   01/23/23 0400 01/23/23 0500 01/23/23 0600 01/23/23 0718  BP:  (!) 151/104 (!) 156/105   Pulse: 80 76 73   Resp: 14 18 16    Temp:    98.1 F (36.7 C)  TempSrc:    Oral  SpO2: 100% 96% 100%   Weight:  107.8 kg    Height:        Intake/Output Summary (Last 24 hours) at 01/23/2023 0839 Last data filed at 01/23/2023 0600 Gross per 24 hour  Intake 166.1 ml  Output 10393.06 ml  Net -10226.96 ml   Filed Weights   01/21/23 0223 01/22/23 1738 01/23/23 0500  Weight: 114.3 kg 109 kg 107.8 kg    Examination:  General exam: Appears calm and comfortable - seems tired Respiratory system: unlabored Cardiovascular system: RRR Central nervous system: Alert and oriented. RUE weakness. Extremities: no LEE, RUE weakness    Data Reviewed: I have personally reviewed following labs and imaging studies  CBC: Recent Labs  Lab  01/18/23 2056 01/18/23 2131 01/19/23 0211 01/19/23 0343 01/19/23 1808 01/20/23 0514 01/21/23 0345 01/22/23 0502 01/23/23 0433  WBC 6.8  --  10.6*   < > 7.4 6.1 6.1 7.2 5.9  NEUTROABS 4.4  --  9.3*  --  5.7  --   --   --   --   HGB 10.9*   < > 9.6*   < > 8.3* 7.7* 8.5* 8.9* 10.1*  HCT 34.0*   < > 29.5*   < > 26.1* 24.5* 26.8* 27.7* 31.4*  MCV 98.0  --  95.8   < > 96.7 96.8 97.1 97.5 95.4  PLT 102*  --  88*   < > 87* 93* 100* 109* 100*   < > = values in this interval not displayed.    Basic Metabolic Panel: Recent Labs  Lab 01/19/23 0211 01/19/23 0343 01/19/23 0720 01/19/23 1429 01/20/23 0514 01/20/23 1918 01/21/23 0345 01/21/23 1831 01/22/23 0502 01/23/23 0433  NA 140 140   < > 138  137  --  134*  --  134* 135  K 3.7 3.8   < > 4.1 3.8  --  4.0  --  3.8 3.9  CL 94* 97*  --  93* 95*  --  93*  --  93* 95*  CO2 30 28  --  28 27  --  26  --  23 23  GLUCOSE 114* 96  --  119* 93  --  79  --  100* 86  BUN 33* 34*  --  29* 38*  --  30*  --  46* 31*  CREATININE 10.72* 10.91*  --  9.59* 11.48*  --  9.54*  --  12.79* 10.20*  CALCIUM 9.2 9.0  --  9.2 9.0  --  9.2  --  9.1 9.2  MG 2.3 2.2  --   --   --  1.9 2.1 2.2  --   --   PHOS 6.6* 6.4*  --   --  8.2* 6.1* 7.8* 7.9* 9.5* 7.8*   < > = values in this interval not displayed.    GFR: Estimated Creatinine Clearance: 12 mL/min (Javell Blackburn) (by C-G formula based on SCr of 10.2 mg/dL (H)).  Liver Function Tests: Recent Labs  Lab 01/18/23 2056 01/19/23 0343 01/20/23 0514 01/21/23 0345 01/22/23 0502 01/23/23 0433  AST 27  --   --   --   --   --   ALT 17  --   --   --   --   --   ALKPHOS 89  --   --   --   --   --   BILITOT 0.9  --   --   --   --   --   PROT 7.4  --   --   --   --   --   ALBUMIN 3.5 3.0* 2.9* 2.9* 3.0* 3.1*    CBG: Recent Labs  Lab 01/22/23 1134 01/22/23 1950 01/22/23 2355 01/23/23 0318 01/23/23 0717  GLUCAP 92 73 110* 82 74     Recent Results (from the past 240 hour(s))  Culture, blood (routine x 2)      Status: None   Collection Time: 01/18/23 10:26 PM   Specimen: BLOOD  Result Value Ref Range Status   Specimen Description BLOOD RIGHT ANTECUBITAL  Final   Special Requests   Final    BOTTLES DRAWN AEROBIC AND ANAEROBIC Blood Culture adequate volume   Culture   Final    NO GROWTH 5 DAYS Performed at [facility], [address] [place], [place] [address]    Report Status 01/23/2023 FINAL  Final  MRSA Next Gen by PCR, Nasal     Status: None   Collection Time: 01/19/23  1:07 AM   Specimen: Nasal Mucosa; Nasal Swab  Result Value Ref Range Status   MRSA by PCR Next Gen NOT DETECTED NOT DETECTED Final    Comment: (NOTE) The GeneXpert MRSA Assay (FDA approved for NASAL specimens only), is one component of Larrie Lucia comprehensive MRSA colonization surveillance program. It is not intended to diagnose MRSA infection nor to guide or monitor treatment for MRSA infections. Test performance is not FDA approved in patients less than 39 years old. Performed at [facility], [address], [place], [place] [address]   Culture, blood (routine x 2)     Status: None (Preliminary result)   Collection Time: 01/19/23  2:13 AM   Specimen: BLOOD RIGHT HAND  Result Value Ref  Range Status   Specimen Description BLOOD RIGHT HAND  Final   Special Requests   Final    BOTTLES DRAWN AEROBIC AND ANAEROBIC Blood Culture results may not be optimal due to an excessive volume of blood received in culture bottles   Culture   Final    NO GROWTH 4 DAYS Performed at [facility], [address] [place], [place] [address]    Report Status PENDING  Incomplete         Radiology Studies: MR CERVICAL SPINE WO CONTRAST  Result Date: 01/21/2023 CLINICAL DATA:  Right cervical radiculopathy EXAM: MRI CERVICAL SPINE WITHOUT CONTRAST TECHNIQUE: Multiplanar, multisequence MR imaging of the cervical spine was performed. No intravenous contrast was administered. COMPARISON:  None Available. FINDINGS: Alignment:  Physiologic. Vertebrae: No fracture, evidence of discitis, or bone lesion. Diffuse low T1/T2-weighted signal within the bone marrow. Cord: Mild hyperintense T2-weighted signal within the spinal cord at the C5 level. There is Saagar Tortorella small amount of ventral epidural fluid at the C6-T1 levels, approximately 2 mm thick. Posterior Fossa, vertebral arteries, paraspinal tissues: Negative Disc levels: C1-2: Unremarkable. C2-3: Normal disc space and facet joints. There is no spinal canal stenosis. No neural foraminal stenosis. C3-4: Normal disc space and facet joints. There is no spinal canal stenosis. No neural foraminal stenosis. C4-5: Small central disc protrusion. Mild spinal canal stenosis. No neural foraminal stenosis. C5-6: Large central disc extrusion with superior migration. Severe spinal canal stenosis. There is compression of the spinal cord with right asymmetric signal change. No neural foraminal stenosis. C6-7: Normal disc space and facet joints. There is no spinal canal stenosis. No neural foraminal stenosis. C7-T1: Normal disc space and facet joints. There is no spinal canal stenosis. No neural foraminal stenosis. IMPRESSION: 1. Large central disc extrusion with superior migration at C5-6 with severe spinal canal stenosis and compression of the spinal cord. Signal changes of compressive myelopathy at the C5 level. 2. Small amount of ventral epidural fluid at the C6-T1 levels, approximately 2 mm thick. This may be Granger Chui small epidural hematoma/effusion secondary to acute disc herniation. 3. Diffuse low T1/T2-weighted signal within the bone marrow may be related to anemia or Anyae Griffith myeloproliferative disorder. Critical Value/emergent results were called by telephone at the time of interpretation on 01/21/2023 at 11:02 pm to Dr. Marlane Mingle, who verbally acknowledged these results. Electronically Signed   By: Deatra Robinson M.D.   On: 01/21/2023 23:03   DG Shoulder Right Port  Result Date: 01/21/2023 CLINICAL DATA:  Right shoulder  pain and right arm weakness. Seizure. EXAM: RIGHT SHOULDER - 1 VIEW COMPARISON:  12/26/2016 and chest radiograph from 01/21/2023 FINDINGS: Indistinct calcific fragments in the axillary pouch region suspicious for free osteochondral fragments. Cannot excluded prior bony Bankart injury. The frontal view obtained is in substantial internal rotation. AC joint alignment not substantially changed from 02/13/2013. No glenohumeral malalignment on the transscapular projection. Right IJ dual lumen catheter noted with tip in the right atrium. Previous endotracheal and nasogastric tubes have been removed. IMPRESSION: 1. Indistinct calcific fragments in the axillary pouch region suspicious for free osteochondral fragments. Cannot exclude prior bony Bankart injury. 2. No current glenohumeral malalignment. 3. Right IJ dual lumen catheter with tip in the right atrium. Electronically Signed   By: Gaylyn Rong M.D.   On: 01/21/2023 17:27        Scheduled Meds:  amLODipine  10 mg Oral Daily   carvedilol  6.25 mg Oral BID WC   Chlorhexidine Gluconate Cloth  6 each  Topical Q0600   cinacalcet  60 mg Oral Q supper   darbepoetin (ARANESP) injection - DIALYSIS  60 mcg Subcutaneous Q Mon-1800   heparin injection (subcutaneous)  5,000 Units Subcutaneous Q8H   levETIRAcetam  500 mg Oral BID   multivitamin  1 tablet Oral QHS   sevelamer carbonate  1,600 mg Oral TID WC   Continuous Infusions:  sodium chloride 10 mL/hr at 01/23/23 0600   ampicillin-sulbactam (UNASYN) IV 1.5 g (01/23/23 0603)     LOS: 4 days    Time spent: over 30 min    Lacretia Nicks, MD [facility]   To contact the attending provider between 7A-7P or the covering provider during after hours 7P-7A, please log into the web site [email or web address] and access using universal [place] password for that web site. If you do not have the password, please call the hospital operator.  01/23/2023, 8:39 AM

## 2023-01-23 NOTE — Progress Notes (Signed)
Inpatient Rehab Admissions Coordinator:   Awaiting determination from insurance regarding CIR prior auth request.  Will follow.   Estill Dooms, PT, DPT Admissions Coordinator [phone] 01/23/23  11:22 AM

## 2023-01-23 NOTE — TOC CM/SW Note (Signed)
Patient has history of ESRD on HD MWF via R tunneled IJ dialysis catheter, seizure disorder, lupus nephritis, and HTN who was admitted to the ICU 01/18/2023. Awaiting determination from insurance regarding CIR prior auth request.  TOC following.

## 2023-01-23 NOTE — Progress Notes (Signed)
  [facility] Progress Note   Subjective:   alert , no c/o's today  Objective Vitals:   01/23/23 1000 01/23/23 1100 01/23/23 1119 01/23/23 1200  BP: (!) 141/95 (!) 134/104  (!) 135/98  Pulse: 70 69  69  Resp: 14 17  15   Temp:   98 F (36.7 C)   TempSrc:   Oral   SpO2: 96% 99%  98%  Weight:      Height:       Physical Exam General: alert, BP's good Heart: SB Lungs: CTA bilat Abdomen: soft Extremities: trace edema Dialysis Access: [facility] c/d/i    Dialysis Orders: [facility] MWF 4h   400/800 112.8kg  2/2 bath  TDC  Heparin none - Mircera 120 mcg IV q 2 weeks (last dose 01/16/2023) - Venofer 50 mg IV q week   Assessment/Plan: Seizures: Had not been taking OP meds. Found down at [employer]. Management per primary.  Acute hypoxic respiratory failure: volume + possible aspiration PNA. Had extra HD 8/04 for volume w/ 2.5 L taken off. Extubated 8/05.   Volume - looks stable, 4kg under dry wt today.  Hypotension:  TTE unrevealing, poss sepsis with asp PNA - on abx per primary. Off pressors and non-ICU at this time.   ESRD -  MWF HD. HD tomorrow off schedule.   Anemia  - HGB 9.0 > 7.7 this AM per CBC. Recent ESA dose. Follow HGB.  Darbe started here at 60 mcg sq weekly.   Metabolic bone disease -  Corrected Ca+ 10 PO4 elevated. Start renvela as binder and resume sensipar 60mg  every day.   Nutrition - Albumin 3.0 Renal diet/protein supplements when able to eat.   SLE- not following with Rheumatology  Chronic Thrombocytopenia-has not kept appts with hematology.  [name] Moselle  MD  [facility] 01/23/2023, 3:04 PM  Recent Labs  Lab 01/22/23 0502 01/23/23 0433  HGB 8.9* 10.1*  ALBUMIN 3.0* 3.1*  CALCIUM 9.1 9.2  PHOS 9.5* 7.8*  CREATININE 12.79* 10.20*  K 3.8 3.9    Inpatient medications:  amLODipine  10 mg Oral Daily   carvedilol  6.25 mg Oral BID WC   Chlorhexidine Gluconate Cloth  6 each Topical Q0600   cinacalcet  60 mg Oral Q supper   darbepoetin (ARANESP)  injection - DIALYSIS  60 mcg Subcutaneous Q Mon-1800   feeding supplement (NEPRO CARB STEADY)  237 mL Oral BID BM   heparin injection (subcutaneous)  5,000 Units Subcutaneous Q8H   levETIRAcetam  500 mg Oral BID   multivitamin  1 tablet Oral QHS   sevelamer carbonate  1,600 mg Oral TID WC    sodium chloride 10 mL/hr at 01/23/23 1400   ampicillin-sulbactam (UNASYN) IV Stopped (01/23/23 2952)   acetaminophen, diphenhydrAMINE, docusate sodium, hydrALAZINE, LORazepam, mouth rinse, polyethylene glycol

## 2023-01-23 NOTE — Progress Notes (Addendum)
Nutrition Follow-up  DOCUMENTATION CODES:  Not applicable  INTERVENTION:  Adjust to 2g Na diet for poor PO intake Nepro Shake po BID, each supplement provides 425 kcal and 19 grams protein Magic cup TID with meals, each supplement provides 290 kcal and 9 grams of protein Continue Renal Multivitamin w/ minerals daily  NUTRITION DIAGNOSIS:   Inadequate oral intake related to inability to eat as evidenced by NPO status. - remains applicable  GOAL:   Patient will meet greater than or equal to 90% of their needs - progressing, diet in place with supplements being added  MONITOR:   PO intake, Labs, I & O's, Supplement acceptance  REASON FOR ASSESSMENT:   Consult Enteral/tube feeding initiation and management  ASSESSMENT:   42 y.o. male presented to the ED with AMS and seizure like activity. PMH includes HTN, seizures, CVA, and ESRD on HD. Pt admitted with breakthrough seizures.   8/02 - admitted 8/03 - intubated 8/5 - extubated  RD working remotely, unable to reach on room phone at this time. Pt able to be extubated and diet was advanced 8/5. Reviewed intake and pt with poor intake of meals and at times hypoglycemia. Will adjust diet to 2g Na as K has been WNL and also add nutrition supplements to meal trays and between meals from floor stock. Noted that pt is pending dc to CIR once insurance authorization is obtained. Pt down from outpatient EDW.  Average Meal Intake: 8/5: 25% intake x 1 recorded meals  Nutritionally Relevant Medications: Scheduled Meds:  cinacalcet  60 mg Q supper   multivitamin  1 tablet QHS   sevelamer carbonate  1,600 mg TID WC   Continuous Infusions:  ampicillin-sulbactam (UNASYN) IV Stopped (01/23/23 0981)   PRN Meds: diphenhydrAMINE, docusate sodium, polyethylene glycol  Labs Reviewed: Chloride 95 BUN 31, creatinine 10.2 Phosphorus 7.8 CBG ranges from 68-110 mg/dL over the last 24 hours  EDW: 112.8 kg  UOP: 0mL documented x 24h 8/6 HD  Net UF: 2700 mL   NUTRITION - FOCUSED PHYSICAL EXAM: Deferred to follow-up.  Diet Order:   Diet Order             Diet 2 gram sodium Room service appropriate? Yes with Assist; Fluid consistency: Thin; Fluid restriction: 1200 mL Fluid  Diet effective now                   EDUCATION NEEDS:   Not appropriate for education at this time  Skin:  Skin Assessment: Reviewed RN Assessment  Last BM:  8/5 type 6  Height:   Ht Readings from Last 1 Encounters:  01/19/23 6\' 1"  (1.854 m)    Weight:   Wt Readings from Last 1 Encounters:  01/23/23 107.8 kg    Ideal Body Weight:  83.6 kg  BMI:  Body mass index is 31.35 kg/m.  Estimated Nutritional Needs:  Kcal:  2200-2500 kcal/d Protein:  110-130g/d Fluid:  1L + UOP   Greig Castilla, RD, LDN Clinical Dietitian RD pager # available in [facility]  After hours/weekend pager # available in [facility]

## 2023-01-23 NOTE — Progress Notes (Signed)
Pt transferred to 2W room 36, belongings place at bedside.

## 2023-01-24 ENCOUNTER — Encounter (HOSPITAL_COMMUNITY): Payer: Self-pay

## 2023-01-24 DIAGNOSIS — R569 Unspecified convulsions: Secondary | ICD-10-CM | POA: Diagnosis not present

## 2023-01-24 LAB — CULTURE, BLOOD (ROUTINE X 2)

## 2023-01-24 MED ORDER — CHLORHEXIDINE GLUCONATE CLOTH 2 % EX PADS: 6.0000 | MEDICATED_PAD | Freq: Every day | CUTANEOUS | Status: DC

## 2023-01-24 MED ORDER — HEPARIN SODIUM (PORCINE) 1000 UNIT/ML IJ SOLN
INTRAMUSCULAR | Status: AC
  Filled 2023-01-24: qty 4

## 2023-01-24 MED ORDER — CHLORHEXIDINE GLUCONATE CLOTH 2 % EX PADS
6.0000 | MEDICATED_PAD | Freq: Every day | CUTANEOUS | Status: DC
  Administered 2023-01-25 – 2023-01-28 (×4): 6 via TOPICAL

## 2023-01-24 NOTE — Care Management Important Message (Signed)
Important Message  Patient Details  Name: [name] [name] MRN: [id number] Date of Birth: [birth year]   Medicare Important Message Given:  Yes     Dorena Bodo 01/24/2023, 2:08 PM

## 2023-01-24 NOTE — Progress Notes (Signed)
   01/24/23 1716  Vitals  Temp 97.6 F (36.4 C)  Pulse Rate 84  Resp 18  BP (!) 160/111  SpO2 95 %  O2 Device Room Air  Weight 107.4 kg  Type of Weight Post-Dialysis  Oxygen Therapy  Patient Activity (if Appropriate) In bed  Pulse Oximetry Type Continuous  Post Treatment  Dialyzer Clearance Lightly streaked  Duration of HD Treatment -hour(s) 3 hour(s)  Hemodialysis Intake (mL) 0 mL  Liters Processed 72  Fluid Removed (mL) 2500 mL  Tolerated HD Treatment Yes  Post-Hemodialysis Comments Tx completed. Tolerated well.   Received patient in bed to unit.  Alert and oriented.  Informed consent signed and in chart.   TX duration:3hrs  Patient tolerated well.  Transported back to the room  Alert, without acute distress.  Hand-off given to patient's nurse.   Access used: [facility] Access issues: none  Total UF removed: 2.5L Medication(s) given: none    Na'Shaminy T Raad [facility]

## 2023-01-24 NOTE — TOC Progression Note (Signed)
Transition of Care [facility]) - Progression Note    Patient Details  Name: [name] [name] MRN: [id number] Date of Birth: 10-12-1980  Transition of [facility]) CM/SW Contact  Janae Bridgeman, RN Phone Number: 01/24/2023, 9:58 AM  Clinical Narrative:    [facility] is requesting a Audiological scientist with attending physician today by noon.  Information was provided to Dr. Elijah Birk requesting peer to peer - [phone] option [phone], insurance ID # [id number].  Yvonna Alanis, CM with [facility] was included in the request for peer to peer update -pending review.        Expected Discharge Plan and Services                                               Social Determinants of Health (SDOH) Interventions    Readmission Risk Interventions     No data to display

## 2023-01-24 NOTE — Progress Notes (Signed)
  [facility] Progress Note   Subjective:   alert, up walking around his room for exercise  Objective Vitals:   01/24/23 1352 01/24/23 1412 01/24/23 1430 01/24/23 1500  BP: (!) 165/113 (!) 172/112 (!) 161/115 (!) 177/112  Pulse: 84 81 80 84  Resp: 14 16 18 13   Temp: 97.7 F (36.5 C)     TempSrc: Oral     SpO2: 94% 91% (!) 82% 94%  Weight: 109.9 kg     Height:       Physical Exam General: alert, BP's good Heart: SB Lungs: CTA bilat Abdomen: soft Extremities: trace edema Dialysis Access: [facility] c/d/i    Dialysis Orders: [facility] MWF 4h   400/800 112.8kg  2/2 bath  TDC  Heparin none - Mircera 120 mcg IV q 2 weeks (last dose 01/16/2023) - Venofer 50 mg IV q week   Assessment/Plan: Seizures: Had not been taking OP meds. Found down at [employer]. Management per primary.  Acute hypoxic respiratory failure: volume + possible aspiration PNA. Rx'd w/ HD and IV abx. Extubated 8/05. Resolved.   Volume - looks stable, 4kg under dry wt today.   BP:  TTE unrevealing, poss sepsis with asp PNA on abx. BP's are now high.   ESRD -  MWF HD. HD today off schedule. Plan HD again tomorrow to get back on schedule.   Anemia  - HGB 9.0 > 7.7 this AM per CBC. Recent ESA dose. Follow HGB.  Darbe started here at 60 mcg sq weekly.   Metabolic bone disease -  Corrected Ca+ 10 PO4 elevated. Start renvela as binder and resume sensipar 60mg  every day.   Nutrition - Albumin 3.0 Renal diet/protein supplements when able to eat.   SLE- not following with Rheumatology  Chronic Thrombocytopenia-has not kept appts with hematology.  [name] Moselle  MD  [facility] 01/24/2023, 3:41 PM  Recent Labs  Lab 01/23/23 0433 01/24/23 0059  HGB 10.1* 10.2*  ALBUMIN 3.1* 3.3*  CALCIUM 9.2 8.9  PHOS 7.8* 6.5*  CREATININE 10.20* 12.72*  K 3.9 3.9    Inpatient medications:  amLODipine  10 mg Oral Daily   carvedilol  6.25 mg Oral BID WC   Chlorhexidine Gluconate Cloth  6 each Topical Q0600   Chlorhexidine  Gluconate Cloth  6 each Topical Q0600   cinacalcet  60 mg Oral Q supper   darbepoetin (ARANESP) injection - DIALYSIS  60 mcg Subcutaneous Q Mon-1800   feeding supplement (NEPRO CARB STEADY)  237 mL Oral BID BM   heparin injection (subcutaneous)  5,000 Units Subcutaneous Q8H   levETIRAcetam  500 mg Oral BID   multivitamin  1 tablet Oral QHS   sevelamer carbonate  1,600 mg Oral TID WC    sodium chloride 10 mL/hr at 01/23/23 1400   acetaminophen, diphenhydrAMINE, docusate sodium, hydrALAZINE, LORazepam, mouth rinse, polyethylene glycol

## 2023-01-24 NOTE — Plan of Care (Signed)
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
## 2023-01-24 NOTE — Progress Notes (Signed)
Inpatient Rehab Admissions Coordinator:   Notified by insurance that request for CIR has been denied following peer to peer.  Will discuss with patient but likely will need SNF workup.   Estill Dooms, PT, DPT Admissions Coordinator [phone] 01/24/23  4:15 PM

## 2023-01-24 NOTE — Progress Notes (Signed)
PROGRESS NOTE    [name] [name]  ZOX:[id number] DOB: 04-20-1981 DOA: 01/18/2023 PCP: No primary care provider on file.  Chief Complaint  Patient presents with   Seizures    Brief Narrative:   42 year old with [name] [name] history of ESRD on HD MWF via R tunneled IJ dialysis catheter, seizure disorder, lupus nephritis, and HTN who was admitted to the ICU 01/18/2023 after he was found down at [name] following [name] [name] seizure with subsequent altered mental status and [name] [name] suspected aspiration event. He required intubation at presentation and was admitted to the ICU.   Significant Events: 8/2 admitted after seizure with resultant hypoxic respiratory failure -intubation 8/3 HD initiated for volume overload -stopped due to hypotension 8/3 EEG noted diffuse slowing but no seizure or epileptogenicity 8/5 extubated -found to have right arm weakness/unable to lift right arm above shoulder 8/6 TRH assumed care - transferred to Med/Surg bed   Assessment & Plan:   Principal Problem:   Seizure [facility]) Active Problems:   Acute respiratory failure with hypoxia (HCC)  Seizure No recurrent events since admission -continue Keppra -suspected poor compliance with home AEDs Of note, this chart marked for merged, no MRI in this chart, but MRI 07/2022 in chart to be merged motion limited, but without acute abnormalities, extensive periventricular T2/FLAIR hyperintense signal abnormality which correlates with the regions of hypodensity seen on CT brain dated 04/29/2019. This is favored to represent changes related to [name] [name] combination of chronic frontal parietal cortical infarcts superimposed on [name] [name] background of severe chronic microvascular ischemic.  Doesn't appear he's been to see an outpatient neurologist.    Per [facility] statutes, patients with seizures are not allowed to drive until  they have been seizure-free for six months. Use caution when using heavy equipment or power tools. Avoid working on ladders or  at heights. Take showers instead of baths. Ensure the water temperature is not too high on the home water heater. Do not go swimming alone. When caring for infants or small children, sit down when holding, feeding, or changing them to minimize risk of injury to the child in the event you have [name] [name] seizure. Also, Maintain good sleep hygiene. Avoid alcohol.    Aspiration pneumonitis -acute hypoxic respiratory failure Successfully extubated 8/5 -empiric Unasyn through 8/6 -no persisting clinical signs or symptoms of infection   Shock - septic versus sedation induced Required transient use of pressor support -resolved    ESRD on HD MWF Care as per Nephrology   Metabolic encephalopathy due to seizure activity and hypoxia Mental status slowly improving - cont to monitor w/ time  Delirium precautions   Right shoulder pain/weakness -cervical spondylosis/herniated disc with cervical stenosis/myelopathy Cervical MRI reveals herniated disc -right shoulder x-rays suggest degenerative change -Neurosurgery has evaluated and feels he has [name] [name] chronic cervical myelopathy -[name] [name] C5-6 anterior cervicectomy fusion and plating has been recommended but is not felt to be needed acutely -outpatient follow-up with Neurosurgery will be indicated   Anemia of chronic kidney disease Hemoglobin stable - erythropoietin per Nephrology   SLE Not followed by Rheumatology   Chronic thrombocytopenia Has not kept scheduled appointments with Hematology -platelet count stable   HTN  BP poorly controlled - adjust medical tx and follow trend   DM has been ruled out      DVT prophylaxis: heparin Code Status: full Family Communication: none Disposition:   Status is: Inpatient Remains inpatient appropriate because: need for continued inpatient care   Consultants:  Neurosurgery PCCM  Procedures:  intubation  Antimicrobials:  Anti-infectives (From admission, onward)    Start     Dose/Rate Route Frequency Ordered Stop    01/20/23 1745  ampicillin-sulbactam (UNASYN) 1.5 g in sodium chloride 0.9 % 100 mL IVPB        1.5 g 200 mL/hr over 30 Minutes Intravenous Every 12 hours 01/20/23 1019 01/23/23 1814   01/19/23 0530  ampicillin-sulbactam (UNASYN) 1.5 g in sodium chloride 0.9 % 100 mL IVPB  Status:  Discontinued        1.5 g 200 mL/hr over 30 Minutes Intravenous Every 12 hours 01/19/23 0445 01/20/23 1019   01/18/23 2230  vancomycin (VANCOCIN) IVPB 1000 mg/200 mL premix  Status:  Discontinued        1,000 mg 200 mL/hr over 60 Minutes Intravenous  Once 01/18/23 2218 01/18/23 2222   01/18/23 2230  metroNIDAZOLE (FLAGYL) IVPB 500 mg        500 mg 100 mL/hr over 60 Minutes Intravenous  Once 01/18/23 2218 01/19/23 0047   01/18/23 2230  ceFEPIme (MAXIPIME) 2 g in sodium chloride 0.9 % 100 mL IVPB        2 g 200 mL/hr over 30 Minutes Intravenous  Once 01/18/23 2222 01/18/23 2323   01/18/23 2230  vancomycin (VANCOREADY) IVPB 2000 mg/400 mL        2,000 mg 200 mL/hr over 120 Minutes Intravenous  Once 01/18/23 2222 01/19/23 0415       Subjective: No new complaints  Objective: Vitals:   01/24/23 1352 01/24/23 1412 01/24/23 1430 01/24/23 1500  BP: (!) 165/113 (!) 172/112 (!) 161/115 (!) 177/112  Pulse: 84 81 80 84  Resp: 14 16 18 13   Temp: 97.7 F (36.5 C)     TempSrc: Oral     SpO2: 94% 91% (!) 82% 94%  Weight: 109.9 kg     Height:        Intake/Output Summary (Last 24 hours) at 01/24/2023 1530 Last data filed at 01/24/2023 0548 Gross per 24 hour  Intake 240 ml  Output --  Net 240 ml   Filed Weights   01/22/23 1738 01/23/23 0500 01/24/23 1352  Weight: 109 kg 107.8 kg 109.9 kg    Examination:  General: No acute distress. Cardiovascular: RRR Lungs: unlabored Abdomen: Soft, nontender, nondistended Neurological: Alert and oriented 3. Moves all extremities 4 with equal strength. Cranial nerves II through XII grossly intact. Extremities: No clubbing or cyanosis. No edema.   Data Reviewed: I  have personally reviewed following labs and imaging studies  CBC: Recent Labs  Lab 01/18/23 2056 01/18/23 2131 01/19/23 0211 01/19/23 0343 01/19/23 1808 01/20/23 0514 01/21/23 0345 01/22/23 0502 01/23/23 0433 01/24/23 0059  WBC 6.8  --  10.6*   < > 7.4 6.1 6.1 7.2 5.9 7.6  NEUTROABS 4.4  --  9.3*  --  5.7  --   --   --   --  5.0  HGB 10.9*   < > 9.6*   < > 8.3* 7.7* 8.5* 8.9* 10.1* 10.2*  HCT 34.0*   < > 29.5*   < > 26.1* 24.5* 26.8* 27.7* 31.4* 31.3*  MCV 98.0  --  95.8   < > 96.7 96.8 97.1 97.5 95.4 94.0  PLT 102*  --  88*   < > 87* 93* 100* 109* 100* 117*   < > = values in this interval not displayed.    Basic Metabolic Panel: Recent Labs  Lab 01/19/23 0343 01/19/23 0720 01/20/23 0514 01/20/23 1918 01/21/23 0345 01/21/23  1831 01/22/23 0502 01/23/23 0433 01/24/23 0059  NA 140   < > 137  --  134*  --  134* 135 135  K 3.8   < > 3.8  --  4.0  --  3.8 3.9 3.9  CL 97*   < > 95*  --  93*  --  93* 95* 94*  CO2 28   < > 27  --  26  --  23 23 24   GLUCOSE 96   < > 93  --  79  --  100* 86 85  BUN 34*   < > 38*  --  30*  --  46* 31* 47*  CREATININE 10.91*   < > 11.48*  --  9.54*  --  12.79* 10.20* 12.72*  CALCIUM 9.0   < > 9.0  --  9.2  --  9.1 9.2 8.9  MG 2.2  --   --  1.9 2.1 2.2  --   --  2.3  PHOS 6.4*  --  8.2* 6.1* 7.8* 7.9* 9.5* 7.8* 6.5*   < > = values in this interval not displayed.    GFR: Estimated Creatinine Clearance: 9.7 mL/min (Johnnetta Holstine) (by C-G formula based on SCr of 12.72 mg/dL (H)).  Liver Function Tests: Recent Labs  Lab 01/18/23 2056 01/19/23 0343 01/20/23 0514 01/21/23 0345 01/22/23 0502 01/23/23 0433 01/24/23 0059  AST 27  --   --   --   --   --  31  ALT 17  --   --   --   --   --  20  ALKPHOS 89  --   --   --   --   --  78  BILITOT 0.9  --   --   --   --   --  0.9  PROT 7.4  --   --   --   --   --  7.8  ALBUMIN 3.5   < > 2.9* 2.9* 3.0* 3.1* 3.3*   < > = values in this interval not displayed.    CBG: Recent Labs  Lab 01/22/23 2355  01/23/23 0318 01/23/23 0717 01/23/23 1118 01/23/23 1520  GLUCAP 110* 82 74 103* 94     Recent Results (from the past 240 hour(s))  Culture, blood (routine x 2)     Status: None   Collection Time: 01/18/23 10:26 PM   Specimen: BLOOD  Result Value Ref Range Status   Specimen Description BLOOD RIGHT ANTECUBITAL  Final   Special Requests   Final    BOTTLES DRAWN AEROBIC AND ANAEROBIC Blood Culture adequate volume   Culture   Final    NO GROWTH 5 DAYS Performed at [facility], [address], [place], [place] [address]    Report Status 01/23/2023 FINAL  Final  MRSA Next Gen by PCR, Nasal     Status: None   Collection Time: 01/19/23  1:07 AM   Specimen: Nasal Mucosa; Nasal Swab  Result Value Ref Range Status   MRSA by PCR Next Gen NOT DETECTED NOT DETECTED Final    Comment: (NOTE) The GeneXpert MRSA Assay (FDA approved for NASAL specimens only), is one component of Laureen Frederic comprehensive MRSA colonization surveillance program. It is not intended to diagnose MRSA infection nor to guide or monitor treatment for MRSA infections. Test performance is not FDA approved in patients less than 5 years old. Performed at [facility], [address] [place], [place] [address]   Culture,  blood (routine x 2)     Status: None   Collection Time: 01/19/23  2:13 AM   Specimen: BLOOD RIGHT HAND  Result Value Ref Range Status   Specimen Description BLOOD RIGHT HAND  Final   Special Requests   Final    BOTTLES DRAWN AEROBIC AND ANAEROBIC Blood Culture results may not be optimal due to an excessive volume of blood received in culture bottles   Culture   Final    NO GROWTH 5 DAYS Performed at [facility], [address], [place], [place] [address]    Report Status 01/24/2023 FINAL  Final         Radiology Studies: No results found.      Scheduled Meds:  amLODipine  10 mg Oral Daily   carvedilol  6.25 mg Oral BID WC   Chlorhexidine Gluconate Cloth  6 each Topical  Q0600   Chlorhexidine Gluconate Cloth  6 each Topical Q0600   cinacalcet  60 mg Oral Q supper   darbepoetin (ARANESP) injection - DIALYSIS  60 mcg Subcutaneous Q Mon-1800   feeding supplement (NEPRO CARB STEADY)  237 mL Oral BID BM   heparin injection (subcutaneous)  5,000 Units Subcutaneous Q8H   levETIRAcetam  500 mg Oral BID   multivitamin  1 tablet Oral QHS   sevelamer carbonate  1,600 mg Oral TID WC   Continuous Infusions:  sodium chloride 10 mL/hr at 01/23/23 1400     LOS: 5 days    Time spent: over 30 min    Lacretia Nicks, MD [facility]   To contact the attending provider between 7A-7P or the covering provider during after hours 7P-7A, please log into the web site [email or web address] and access using universal [place] password for that web site. If you do not have the password, please call the hospital operator.  01/24/2023, 3:30 PM

## 2023-01-25 DIAGNOSIS — R569 Unspecified convulsions: Secondary | ICD-10-CM | POA: Diagnosis not present

## 2023-01-25 MED ORDER — HEPARIN SODIUM (PORCINE) 1000 UNIT/ML IJ SOLN
3200.0000 [IU] | Freq: Once | INTRAMUSCULAR | Status: AC
  Administered 2023-01-25: 3200 [IU]
  Filled 2023-01-25: qty 4

## 2023-01-25 NOTE — Progress Notes (Signed)
Patient ambulated at the hallway and complained of dizziness afterwards, assisted via wheelchair back to room, bed alarm turned on and instructed to call us whenever he wants to get out of bed. Patient agreed with plan.

## 2023-01-25 NOTE — Progress Notes (Signed)
Called to get report so pt can come to HD was told nurse was busy

## 2023-01-25 NOTE — Progress Notes (Signed)
PROGRESS NOTE    [name] [name]  RUE:[id number] DOB: [birth year] DOA: 01/18/2023 PCP: No primary care provider on file.  Chief Complaint  Patient presents with   Seizures    Brief Narrative:   42 year old with [name] [name] history of ESRD on HD MWF via R tunneled IJ dialysis catheter, seizure disorder, lupus nephritis, and HTN who was admitted to the ICU 01/18/2023 after he was found down at [name] following [name] [name] seizure with subsequent altered mental status and [name] [name] suspected aspiration event. He required intubation at presentation and was admitted to the ICU.   Significant Events: 8/2 admitted after seizure with resultant hypoxic respiratory failure -intubation 8/3 HD initiated for volume overload -stopped due to hypotension 8/3 EEG noted diffuse slowing but no seizure or epileptogenicity 8/5 extubated -found to have right arm weakness/unable to lift right arm above shoulder 8/6 TRH assumed care - transferred to Med/Surg bed   Assessment & Plan:   Principal Problem:   Seizure [facility]) Active Problems:   Acute respiratory failure with hypoxia [facility])  Peer to peer for inpatient rehab unfortunately declined.  Lightheadedness Noted while walking earlier today Orthostatics negative Will continue to monitor as he continues to mobilize.  Seizure No recurrent events since admission -continue Keppra -suspected poor compliance with home AEDs Of note, this chart marked for merged, no MRI in this chart, but MRI 07/2022 in chart to be merged motion limited, but without acute abnormalities, extensive periventricular T2/FLAIR hyperintense signal abnormality which correlates with the regions of hypodensity seen on CT brain dated 04/29/2019. This is favored to represent changes related to [name] [name] combination of chronic frontal parietal cortical infarcts superimposed on [name] [name] background of severe chronic microvascular ischemic.  Doesn't appear he's been to see an outpatient neurologist.    Per [facility]  statutes, patients with seizures are not allowed to drive until  they have been seizure-free for six months. Use caution when using heavy equipment or power tools. Avoid working on ladders or at heights. Take showers instead of baths. Ensure the water temperature is not too high on the home water heater. Do not go swimming alone. When caring for infants or small children, sit down when holding, feeding, or changing them to minimize risk of injury to the child in the event you have [name] [name] seizure. Also, Maintain good sleep hygiene. Avoid alcohol.  Aspiration pneumonitis -acute hypoxic respiratory failure Successfully extubated 8/5 -empiric Unasyn through 8/6 -no persisting clinical signs or symptoms of infection   Shock - septic versus sedation induced Required transient use of pressor support -resolved    ESRD on HD MWF Care as per Nephrology   Metabolic encephalopathy due to seizure activity and hypoxia Mental status slowly improving - cont to monitor w/ time  Delirium precautions   Right shoulder pain/weakness -cervical spondylosis/herniated disc with cervical stenosis/myelopathy Cervical MRI reveals herniated disc -right shoulder x-rays suggest degenerative change -Neurosurgery has evaluated and feels he has [name] [name] chronic cervical myelopathy -[name] [name] C5-6 anterior cervicectomy fusion and plating has been recommended but is not felt to be needed acutely -outpatient follow-up with Neurosurgery will be indicated   Anemia of chronic kidney disease Hemoglobin stable - erythropoietin per Nephrology   SLE Not followed by Rheumatology   Chronic thrombocytopenia Has not kept scheduled appointments with Hematology -platelet count stable   HTN  BP poorly controlled - adjust medical tx and follow trend   DM has been ruled out      DVT prophylaxis: heparin Code Status: full Family  Communication: none Disposition:   Status is: Inpatient Remains inpatient appropriate because: need for continued  inpatient care   Consultants:  Neurosurgery PCCM  Procedures:  intubation  Antimicrobials:  Anti-infectives (From admission, onward)    Start     Dose/Rate Route Frequency Ordered Stop   01/20/23 1745  ampicillin-sulbactam (UNASYN) 1.5 g in sodium chloride 0.9 % 100 mL IVPB        1.5 g 200 mL/hr over 30 Minutes Intravenous Every 12 hours 01/20/23 1019 01/23/23 1814   01/19/23 0530  ampicillin-sulbactam (UNASYN) 1.5 g in sodium chloride 0.9 % 100 mL IVPB  Status:  Discontinued        1.5 g 200 mL/hr over 30 Minutes Intravenous Every 12 hours 01/19/23 0445 01/20/23 1019   01/18/23 2230  vancomycin (VANCOCIN) IVPB 1000 mg/200 mL premix  Status:  Discontinued        1,000 mg 200 mL/hr over 60 Minutes Intravenous  Once 01/18/23 2218 01/18/23 2222   01/18/23 2230  metroNIDAZOLE (FLAGYL) IVPB 500 mg        500 mg 100 mL/hr over 60 Minutes Intravenous  Once 01/18/23 2218 01/19/23 0047   01/18/23 2230  ceFEPIme (MAXIPIME) 2 g in sodium chloride 0.9 % 100 mL IVPB        2 g 200 mL/hr over 30 Minutes Intravenous  Once 01/18/23 2222 01/18/23 2323   01/18/23 2230  vancomycin (VANCOREADY) IVPB 2000 mg/400 mL        2,000 mg 200 mL/hr over 120 Minutes Intravenous  Once 01/18/23 2222 01/19/23 0415       Subjective: No complaints  Objective: Vitals:   01/25/23 1213 01/25/23 1550 01/25/23 1605 01/25/23 1607  BP:  122/77 114/75 118/85  Pulse:  (!) 101 95 88  Resp:  16    Temp:  97.8 F (36.6 C)    TempSrc:  Oral    SpO2:  100% 96% 98%  Weight: 104.8 kg     Height:        Intake/Output Summary (Last 24 hours) at 01/25/2023 1628 Last data filed at 01/25/2023 1201 Gross per 24 hour  Intake --  Output 4602.68 ml  Net -4602.68 ml   Filed Weights   01/25/23 0325 01/25/23 0829 01/25/23 1213  Weight: 108.4 kg 107 kg 104.8 kg    Examination:  General: No acute distress. Cardiovascular: RRR Lungs: unlabored Abdomen: Soft, nontender, nondistended Neurological: Alert and oriented  3. Moves all extremities 4 with equal strength. Cranial nerves II through XII grossly intact. Extremities: No clubbing or cyanosis. No edema.   Data Reviewed: I have personally reviewed following labs and imaging studies  CBC: Recent Labs  Lab 01/18/23 2056 01/18/23 2131 01/19/23 0211 01/19/23 0343 01/19/23 1808 01/20/23 0514 01/21/23 0345 01/22/23 0502 01/23/23 0433 01/24/23 0059 01/25/23 0350  WBC 6.8  --  10.6*   < > 7.4   < > 6.1 7.2 5.9 7.6 7.4  NEUTROABS 4.4  --  9.3*  --  5.7  --   --   --   --  5.0  --   HGB 10.9*   < > 9.6*   < > 8.3*   < > 8.5* 8.9* 10.1* 10.2* 11.2*  HCT 34.0*   < > 29.5*   < > 26.1*   < > 26.8* 27.7* 31.4* 31.3* 33.6*  MCV 98.0  --  95.8   < > 96.7   < > 97.1 97.5 95.4 94.0 94.1  PLT 102*  --  88*   < >  87*   < > 100* 109* 100* 117* 106*   < > = values in this interval not displayed.    Basic Metabolic Panel: Recent Labs  Lab 01/20/23 1918 01/21/23 0345 01/21/23 1831 01/22/23 0502 01/23/23 0433 01/24/23 0059 01/25/23 0350  NA  --  134*  --  134* 135 135 134*  K  --  4.0  --  3.8 3.9 3.9 3.9  CL  --  93*  --  93* 95* 94* 93*  CO2  --  26  --  23 23 24 23   GLUCOSE  --  79  --  100* 86 85 91  BUN  --  30*  --  46* 31* 47* 33*  CREATININE  --  9.54*  --  12.79* 10.20* 12.72* 10.38*  CALCIUM  --  9.2  --  9.1 9.2 8.9 9.2  MG 1.9 2.1 2.2  --   --  2.3 2.1  PHOS 6.1* 7.8* 7.9* 9.5* 7.8* 6.5* 6.1*    GFR: Estimated Creatinine Clearance: 11.7 mL/min (Gyselle Matthew) (by C-G formula based on SCr of 10.38 mg/dL (H)).  Liver Function Tests: Recent Labs  Lab 01/18/23 2056 01/19/23 0343 01/20/23 0514 01/21/23 0345 01/22/23 0502 01/23/23 0433 01/24/23 0059  AST 27  --   --   --   --   --  31  ALT 17  --   --   --   --   --  20  ALKPHOS 89  --   --   --   --   --  78  BILITOT 0.9  --   --   --   --   --  0.9  PROT 7.4  --   --   --   --   --  7.8  ALBUMIN 3.5   < > 2.9* 2.9* 3.0* 3.1* 3.3*   < > = values in this interval not displayed.     CBG: Recent Labs  Lab 01/22/23 2355 01/23/23 0318 01/23/23 0717 01/23/23 1118 01/23/23 1520  GLUCAP 110* 82 74 103* 94     Recent Results (from the past 240 hour(s))  Culture, blood (routine x 2)     Status: None   Collection Time: 01/18/23 10:26 PM   Specimen: BLOOD  Result Value Ref Range Status   Specimen Description BLOOD RIGHT ANTECUBITAL  Final   Special Requests   Final    BOTTLES DRAWN AEROBIC AND ANAEROBIC Blood Culture adequate volume   Culture   Final    NO GROWTH 5 DAYS Performed at [facility], [address], [place], [place] [address]    Report Status 01/23/2023 FINAL  Final  MRSA Next Gen by PCR, Nasal     Status: None   Collection Time: 01/19/23  1:07 AM   Specimen: Nasal Mucosa; Nasal Swab  Result Value Ref Range Status   MRSA by PCR Next Gen NOT DETECTED NOT DETECTED Final    Comment: (NOTE) The GeneXpert MRSA Assay (FDA approved for NASAL specimens only), is one component of Debrina Kizer comprehensive MRSA colonization surveillance program. It is not intended to diagnose MRSA infection nor to guide or monitor treatment for MRSA infections. Test performance is not FDA approved in patients less than 80 years old. Performed at [facility], [address] [place], [place] [address]   Culture, blood (routine x 2)     Status: None   Collection Time: 01/19/23  2:13 AM   Specimen: BLOOD RIGHT  HAND  Result Value Ref Range Status   Specimen Description BLOOD RIGHT HAND  Final   Special Requests   Final    BOTTLES DRAWN AEROBIC AND ANAEROBIC Blood Culture results may not be optimal due to an excessive volume of blood received in culture bottles   Culture   Final    NO GROWTH 5 DAYS Performed at [facility], [address] [place], [place] [address]    Report Status 01/24/2023 FINAL  Final         Radiology Studies: No results found.      Scheduled Meds:  amLODipine  10 mg Oral Daily   carvedilol  6.25 mg Oral BID WC    Chlorhexidine Gluconate Cloth  6 each Topical Daily   cinacalcet  60 mg Oral Q supper   darbepoetin (ARANESP) injection - DIALYSIS  60 mcg Subcutaneous Q Mon-1800   feeding supplement (NEPRO CARB STEADY)  237 mL Oral BID BM   heparin injection (subcutaneous)  5,000 Units Subcutaneous Q8H   levETIRAcetam  500 mg Oral BID   multivitamin  1 tablet Oral QHS   sevelamer carbonate  1,600 mg Oral TID WC   Continuous Infusions:  sodium chloride 10 mL/hr at 01/23/23 1400     LOS: 6 days    Time spent: over 30 min    Lacretia Nicks, MD [facility]   To contact the attending provider between 7A-7P or the covering provider during after hours 7P-7A, please log into the web site [email or web address] and access using universal [place] password for that web site. If you do not have the password, please call the hospital operator.  01/25/2023, 4:28 PM

## 2023-01-25 NOTE — Progress Notes (Signed)
Inpatient Rehab Admissions Coordinator:   Met with pt at bedside to update on insurance authorization.  He wishe to d/c home when medically ready with [facility] therapy.  Will let TOC know and sign off.   Estill Dooms, PT, DPT Admissions Coordinator [phone] 01/25/23  2:48 PM

## 2023-01-25 NOTE — Progress Notes (Signed)
OT Cancellation Note  Patient Details Name: [name] [name] MRN: [id number] DOB: 10/19/1980   Cancelled Treatment:    Reason Eval/Treat Not Completed: Patient at procedure or test/ unavailable Patient receiving HD, OT will follow back as time permits to complete treatment session.   Pollyann Glen E. Givanni [place], OTR/L Acute Rehabilitation Services [phone]   Cherlyn Cushing 01/25/2023, 10:13 AM

## 2023-01-25 NOTE — Progress Notes (Signed)
Pt receives out-pt HD at [facility] on MWF. Will assist as needed.   Tracy Mounce Renal Navigator [phone] 

## 2023-01-25 NOTE — Progress Notes (Signed)
Received patient in bed to unit.  Alert and oriented.  Informed consent signed and in chart.   TX duration:3.25  Patient tolerated well.  Transported back to the room  Alert, without acute distress.  Hand-off given to patient's nurse.   Access used: right [facility] Access issues: none  Total UF removed: 2.1L Medication(s) given: none   01/25/23 1201  Vitals  Temp 98.3 F (36.8 C)  Temp Source Oral  BP 101/88  MAP (mmHg) 94  BP Location Left Arm  BP Method Automatic  Patient Position (if appropriate) Lying  Pulse Rate 96  Pulse Rate Source Monitor  ECG Heart Rate 98  Resp 16  Oxygen Therapy  SpO2 96 %  O2 Device Room Air  During Treatment Monitoring  Blood Flow Rate (mL/min) 399 mL/min  Arterial Pressure (mmHg) -267.66 mmHg  Venous Pressure (mmHg) 207.87 mmHg  TMP (mmHg) -5.05 mmHg  Ultrafiltration Rate (mL/min) 0 mL/min  Dialysate Flow Rate (mL/min) 299 ml/min  HD Safety Checks Performed Yes  Intra-Hemodialysis Comments Tx completed;Tolerated well  Dialysis Fluid Bolus Normal Saline  Bolus Amount (mL) 300 mL  Dialysate Potassium Concentration 3  Dialysate Calcium Concentration 2.5  Post Treatment  Dialyzer Clearance Heavily streaked  Duration of HD Treatment -hour(s) 3.24 hour(s)  Liters Processed 77.9  Fluid Removed (mL) 2102.68 mL  Tolerated HD Treatment No (Comment) (cramping towards the end of tx)      Kamin Niblack S [facility]

## 2023-01-25 NOTE — Progress Notes (Signed)
  [facility] Progress Note   Subjective:   alert, seen in HD unit  Objective Vitals:   01/25/23 1201 01/25/23 1209 01/25/23 1213 01/25/23 1550  BP: 101/88 110/86  122/77  Pulse: 96 100  (!) 101  Resp: 16 19  16   Temp: 98.3 F (36.8 C)   97.8 F (36.6 C)  TempSrc: Oral     SpO2: 96% 95%  100%  Weight:   104.8 kg   Height:       Physical Exam General: alert, BP's good Heart: SB Lungs: CTA bilat Abdomen: soft Extremities: trace edema Dialysis Access: [facility] c/d/i    Dialysis Orders: [facility] MWF 4h   400/800 112.8kg  2/2 bath  TDC  Heparin none - Mircera 120 mcg IV q 2 weeks (last dose 01/16/2023) - Venofer 50 mg IV q week   Assessment/Plan: Seizures: Had not been taking OP meds. Found down at [employer]. Management per primary.  Acute hypoxic respiratory failure: volume + possible aspiration PNA. Rx'd w/ HD and IV abx. Resolved.   Volume - looks stable, 7 kg under dry wt today. Last CXR 8/05 looked wet but wt's now are down significantly. Get repeat CXR tomorrow.   BP:  TTE unrevealing, poss sepsis with asp PNA on abx. BP's are now high.   ESRD -  MWF HD. HD yest off schedule and today on schedule. Next HD Monday.   Anemia  - HGB 9.0 > 7.7 this AM per CBC. Recent ESA dose. Follow HGB.  Darbe started here at 60 mcg sq weekly.   Metabolic bone disease -  Corrected Ca+ 10 PO4 elevated. Start renvela as binder and resume sensipar 60mg  every day.   Nutrition - Albumin 3.0 Renal diet/protein supplements when able to eat.   SLE- not following with Rheumatology  Chronic Thrombocytopenia-has not kept appts with hematology.  Vinson Moselle  MD  [facility] 01/25/2023, 4:01 PM  Recent Labs  Lab 01/23/23 0433 01/24/23 0059 01/25/23 0350  HGB 10.1* 10.2* 11.2*  ALBUMIN 3.1* 3.3*  --   CALCIUM 9.2 8.9 9.2  PHOS 7.8* 6.5* 6.1*  CREATININE 10.20* 12.72* 10.38*  K 3.9 3.9 3.9    Inpatient medications:  amLODipine  10 mg Oral Daily   carvedilol  6.25 mg Oral BID WC    Chlorhexidine Gluconate Cloth  6 each Topical Daily   cinacalcet  60 mg Oral Q supper   darbepoetin (ARANESP) injection - DIALYSIS  60 mcg Subcutaneous Q Mon-1800   feeding supplement (NEPRO CARB STEADY)  237 mL Oral BID BM   heparin injection (subcutaneous)  5,000 Units Subcutaneous Q8H   levETIRAcetam  500 mg Oral BID   multivitamin  1 tablet Oral QHS   sevelamer carbonate  1,600 mg Oral TID WC    sodium chloride 10 mL/hr at 01/23/23 1400   acetaminophen, diphenhydrAMINE, docusate sodium, hydrALAZINE, LORazepam, mouth rinse, polyethylene glycol

## 2023-01-25 NOTE — Plan of Care (Signed)
  Problem: Fluid Volume: Goal: Ability to maintain a balanced intake and output will improve Outcome: Not Progressing   Problem: Nutritional: Goal: Maintenance of adequate nutrition will improve Outcome: Not Progressing   Problem: Safety: Goal: Ability to remain free from injury will improve Outcome: Not Progressing

## 2023-01-25 NOTE — Progress Notes (Signed)
Transition of Care [facility]) - Inpatient Brief Assessment   Patient Details  Name: [name] MRN: [id number] Date of Birth: 1980-08-30  [facility]) CM/SW Contact:    Janae Bridgeman, RN Phone Number: 01/25/2023, 3:42 PM   Clinical Narrative: Patient admitted to the hospital with seizures due to noncompliance with Keppra.  The patient wants to go home and not pursue rehabilitation.  The patient has been using a RW in the nursing unit without difficulty.  The patient does not have DME in the hotel and plans to call his roommate today to provide transportation back to the hotel.  The patient states that his roommate assists with transportation needs.  Medicare transportation resources provided in the AVS as well.  Patient does not have a PCP.  The [facility] plans [facility] and follow up for appointment in the next 7-10 days.  I provided OUtpatient referral for OUtpatient rehabilitation.  Patient has access to transportation with roommate or Medicaid transportation.  I provided Medicare choice regarding DME company and the patient did not have a preference.  I called [employer] and requested RW be delivered to the patient's hospital room today.  Patient plans to call the roommate to provide transportation back to the hotel.  MD plans to see the patient today before decision is made for discharge.  If stable - medications will be called into the [facility] pharmacy to assist with discharge medications.   Transition of Care Asessment: Insurance and Status: (P) Insurance coverage has been reviewed Patient has primary care physician: (P) No (Patient plans to [facility] and schedule a hospital follow up with the clinic - reminder placed in AVS) Home environment has been reviewed: (P) Yes - patient lives in [place] [facility] with roommate Prior level of function:: (P) Independent Prior/Current Home Services: (P) No current home services Social Determinants  of Health Reivew: (P) SDOH reviewed interventions complete Readmission risk has been reviewed: (P) Yes Transition of care needs: (P) transition of care needs identified, TOC will continue to follow

## 2023-01-26 ENCOUNTER — Inpatient Hospital Stay (HOSPITAL_COMMUNITY): Payer: 59

## 2023-01-26 DIAGNOSIS — R569 Unspecified convulsions: Secondary | ICD-10-CM | POA: Diagnosis not present

## 2023-01-26 MED ORDER — MELATONIN 5 MG PO TABS
5.0000 mg | ORAL_TABLET | Freq: Every evening | ORAL | Status: AC | PRN
  Administered 2023-01-26: 5 mg via ORAL
  Filled 2023-01-26: qty 1

## 2023-01-26 MED ORDER — ONDANSETRON HCL 4 MG/2ML IJ SOLN
4.0000 mg | Freq: Four times a day (QID) | INTRAMUSCULAR | Status: DC | PRN
  Administered 2023-01-26: 4 mg via INTRAVENOUS
  Filled 2023-01-26: qty 2

## 2023-01-26 NOTE — Progress Notes (Addendum)
  [facility] Progress Note   Subjective:   alert, seen in room. No c/o's today.   Objective Vitals:   01/26/23 0331 01/26/23 0818 01/26/23 0819 01/26/23 0820  BP:  117/80 117/88 134/88  Pulse:  100 (!) 106 92  Resp:  16 16 16   Temp:  99.3 F (37.4 C) 99.3 F (37.4 C) 99.3 F (37.4 C)  TempSrc:  Axillary Axillary Axillary  SpO2:  99% 99% 96%  Weight: 105.2 kg     Height:       Physical Exam General: alert, BP's good Heart: SB Lungs: CTA bilat Abdomen: soft Extremities: trace edema Dialysis Access: [facility] c/d/i    Dialysis Orders: [facility] MWF 4h   400/800 112.8kg  2/2 bath  TDC  Heparin none - Mircera 120 mcg IV q 2 weeks (last dose 01/16/2023) - Venofer 50 mg IV q week   Assessment/Plan: Seizures: Had not been taking OP meds. Found down at [employer]. Better. Management per primary.  Acute hypoxic respiratory failure: pulm edema + possible aspiration PNA. Rx'd w/ HD and IV abx. Resolved.   Volume - 7 kg under dry wt. Last CXR 8/05 showed edema, f/u CXR today is normal.   BP:  BP's are now on the low to normal side, taking 2 BP meds. Stable.   ESRD -  MWF HD. Next HD Monday.   Anemia  - Hb dropped early this admit into 7's, but with volume removal (7kg under dry wt) and [facility] Rx, Hb has improved up to 10-12 here. Cont darbe 60 mcg sq weekly while here.   Metabolic bone disease -  Corrected Ca+ 10 PO4 elevated. Started renvela as binder and resume sensipar 60mg  every day.   Nutrition - Albumin 3.0 Renal diet/protein supplements when able to eat.    Vinson Moselle  MD  [facility] 01/26/2023, 9:40 AM  Recent Labs  Lab 01/24/23 0059 01/25/23 0350 01/26/23 0423  HGB 10.2* 11.2* 12.9*  ALBUMIN 3.3*  --  3.8  CALCIUM 8.9 9.2 9.6  PHOS 6.5* 6.1* 6.6*  CREATININE 12.72* 10.38* 9.29*  K 3.9 3.9 4.2    Inpatient medications:  amLODipine  10 mg Oral Daily   carvedilol  6.25 mg Oral BID WC   Chlorhexidine Gluconate Cloth  6 each Topical Daily   cinacalcet  60  mg Oral Q supper   darbepoetin (ARANESP) injection - DIALYSIS  60 mcg Subcutaneous Q Mon-1800   feeding supplement (NEPRO CARB STEADY)  237 mL Oral BID BM   heparin injection (subcutaneous)  5,000 Units Subcutaneous Q8H   levETIRAcetam  500 mg Oral BID   multivitamin  1 tablet Oral QHS   sevelamer carbonate  1,600 mg Oral TID WC    sodium chloride 10 mL/hr at 01/23/23 1400   acetaminophen, diphenhydrAMINE, docusate sodium, hydrALAZINE, LORazepam, mouth rinse, polyethylene glycol

## 2023-01-26 NOTE — Plan of Care (Signed)
  Problem: Fluid Volume: Goal: Ability to maintain a balanced intake and output will improve Outcome: Not Progressing   Problem: Activity: Goal: Risk for activity intolerance will decrease Outcome: Not Progressing   Problem: Safety: Goal: Ability to remain free from injury will improve Outcome: Not Progressing

## 2023-01-26 NOTE — Progress Notes (Addendum)
PROGRESS NOTE    [name] [name]  MWU:[id number] DOB: Oct 20, 1980 DOA: 01/18/2023 PCP: No primary care provider on file.  Chief Complaint  Patient presents with   Seizures    Brief Narrative:   42 year old with [name] history of ESRD on HD MWF via R tunneled IJ dialysis catheter, seizure disorder, lupus nephritis, and HTN who was admitted to the ICU 01/18/2023 after he was found down at [name] [employer] following [name] seizure with subsequent altered mental status and [name] suspected aspiration event. He required intubation at presentation and was admitted to the ICU.   Significant Events: 8/2 admitted after seizure with resultant hypoxic respiratory failure -intubation 8/3 HD initiated for volume overload -stopped due to hypotension 8/3 EEG noted diffuse slowing but no seizure or epileptogenicity 8/5 extubated -found to have right arm weakness/unable to lift right arm above shoulder 8/6 [facility] assumed care - transferred to Med/Surg bed   Assessment & Plan:   Principal Problem:   Seizure (HCC) Active Problems:   Acute respiratory failure with hypoxia (HCC)  Lightheadedness Vertigo Follow MRI brain  Orthostatics negative Will continue to monitor as he continues to mobilize.  Seizure No recurrent events since admission -continue Keppra -suspected poor compliance with home AEDs Of note, this chart marked for merged, no MRI in this chart, but MRI 07/2022 in chart to be merged motion limited, but without acute abnormalities, extensive periventricular T2/FLAIR hyperintense signal abnormality which correlates with the regions of hypodensity seen on CT brain dated 04/29/2019. This is favored to represent changes related to [name] combination of chronic frontal parietal cortical infarcts superimposed on [name] background of severe chronic microvascular ischemic.  Doesn't appear he's been to see an outpatient neurologist.    Per [name] statutes, patients with seizures are not allowed to drive until   they have been seizure-free for six months. Use caution when using heavy equipment or power tools. Avoid working on ladders or at heights. Take showers instead of baths. Ensure the water temperature is not too high on the home water heater. Do not go swimming alone. When caring for infants or small children, sit down when holding, feeding, or changing them to minimize risk of injury to the child in the event you have Abenezer Odonell seizure. Also, Maintain good sleep hygiene. Avoid alcohol.  Aspiration pneumonitis -acute hypoxic respiratory failure Successfully extubated 8/5 -empiric Unasyn through 8/6 -no persisting clinical signs or symptoms of infection   Shock - septic versus sedation induced Required transient use of pressor support -resolved    ESRD on HD MWF Care as per Nephrology   Metabolic encephalopathy due to seizure activity and hypoxia Mental status slowly improving - cont to monitor w/ time  Delirium precautions   Right shoulder pain/weakness -cervical spondylosis/herniated disc with cervical stenosis/myelopathy Cervical MRI reveals herniated disc -right shoulder x-rays suggest degenerative change -Neurosurgery has evaluated and feels he has [name] chronic cervical myelopathy -[name] [name] C5-6 anterior cervicectomy fusion and plating has been recommended but is not felt to be needed acutely -outpatient follow-up with Neurosurgery will be indicated   Anemia of chronic kidney disease Hemoglobin stable - erythropoietin per Nephrology   SLE Not followed by Rheumatology   Chronic thrombocytopenia Has not kept scheduled appointments with Hematology Trend    HTN  BP poorly controlled - adjust medical tx and follow trend   DM has been ruled out    [name] [name] for inpatient rehab unfortunately declined.    DVT prophylaxis: heparin Code Status: full Family Communication:  none Disposition:   Status is: Inpatient Remains inpatient appropriate because: need for continued inpatient care    Consultants:  Neurosurgery PCCM  Procedures:  intubation  Antimicrobials:  Anti-infectives (From admission, onward)    Start     Dose/Rate Route Frequency Ordered Stop   01/20/23 1745  ampicillin-sulbactam (UNASYN) 1.5 g in sodium chloride 0.9 % 100 mL IVPB        1.5 g 200 mL/hr over 30 Minutes Intravenous Every 12 hours 01/20/23 1019 01/23/23 1814   01/19/23 0530  ampicillin-sulbactam (UNASYN) 1.5 g in sodium chloride 0.9 % 100 mL IVPB  Status:  Discontinued        1.5 g 200 mL/hr over 30 Minutes Intravenous Every 12 hours 01/19/23 0445 01/20/23 1019   01/18/23 2230  vancomycin (VANCOCIN) IVPB 1000 mg/200 mL premix  Status:  Discontinued        1,000 mg 200 mL/hr over 60 Minutes Intravenous  Once 01/18/23 2218 01/18/23 2222   01/18/23 2230  metroNIDAZOLE (FLAGYL) IVPB 500 mg        500 mg 100 mL/hr over 60 Minutes Intravenous  Once 01/18/23 2218 01/19/23 0047   01/18/23 2230  ceFEPIme (MAXIPIME) 2 g in sodium chloride 0.9 % 100 mL IVPB        2 g 200 mL/hr over 30 Minutes Intravenous  Once 01/18/23 2222 01/18/23 2323   01/18/23 2230  vancomycin (VANCOREADY) IVPB 2000 mg/400 mL        2,000 mg 200 mL/hr over 120 Minutes Intravenous  Once 01/18/23 2222 01/19/23 0415       Subjective: C/o room spinning sensation while walking  Objective: Vitals:   01/26/23 0331 01/26/23 0818 01/26/23 0819 01/26/23 0820  BP:  117/80 117/88 134/88  Pulse:  100 (!) 106 92  Resp:  16 16 16   Temp:  99.3 F (37.4 C) 99.3 F (37.4 C) 99.3 F (37.4 C)  TempSrc:  Axillary Axillary Axillary  SpO2:  99% 99% 96%  Weight: 105.2 kg     Height:       No intake or output data in the 24 hours ending 01/26/23 1508  Filed Weights   01/25/23 0829 01/25/23 1213 01/26/23 0331  Weight: 107 kg 104.8 kg 105.2 kg    Examination:  General: No acute distress. Cardiovascular: Heart sounds show Maisha Bogen regular rate, and rhythm. No gallops or rubs. No murmurs. No JVD. Lungs: Clear to auscultation  bilaterally with good air movement. No rales, rhonchi or wheezes. Abdomen: Soft, nontender, nondistended with normal active bowel sounds. No masses. No hepatosplenomegaly. Neurological: Alert and oriented 3. Moves all extremities 4 with equal strength. Ataxia with FNF and heel to shin on left. Extremities: No clubbing or cyanosis. No edema. Data Reviewed: I have personally reviewed following labs and imaging studies  CBC: Recent Labs  Lab 01/19/23 1808 01/20/23 0514 01/22/23 0502 01/23/23 0433 01/24/23 0059 01/25/23 0350 01/26/23 0423  WBC 7.4   < > 7.2 5.9 7.6 7.4 9.8  NEUTROABS 5.7  --   --   --  5.0  --  6.4  HGB 8.3*   < > 8.9* 10.1* 10.2* 11.2* 12.9*  HCT 26.1*   < > 27.7* 31.4* 31.3* 33.6* 39.3  MCV 96.7   < > 97.5 95.4 94.0 94.1 94.0  PLT 87*   < > 109* 100* 117* 106* 91*   < > = values in this interval not displayed.    Basic Metabolic Panel: Recent Labs  Lab 01/21/23 0345 01/21/23 1831  01/22/23 0502 01/23/23 0433 01/24/23 0059 01/25/23 0350 01/26/23 0423  NA 134*  --  134* 135 135 134* 133*  K 4.0  --  3.8 3.9 3.9 3.9 4.2  CL 93*  --  93* 95* 94* 93* 93*  CO2 26  --  23 23 24 23 22   GLUCOSE 79  --  100* 86 85 91 80  BUN 30*  --  46* 31* 47* 33* 32*  CREATININE 9.54*  --  12.79* 10.20* 12.72* 10.38* 9.29*  CALCIUM 9.2  --  9.1 9.2 8.9 9.2 9.6  MG 2.1 2.2  --   --  2.3 2.1 2.1  PHOS 7.8* 7.9* 9.5* 7.8* 6.5* 6.1* 6.6*    GFR: Estimated Creatinine Clearance: 13.1 mL/min (Dahl Higinbotham) (by C-G formula based on SCr of 9.29 mg/dL (H)).  Liver Function Tests: Recent Labs  Lab 01/21/23 0345 01/22/23 0502 01/23/23 0433 01/24/23 0059 01/26/23 0423  AST  --   --   --  31 29  ALT  --   --   --  20 25  ALKPHOS  --   --   --  78 84  BILITOT  --   --   --  0.9 0.6  PROT  --   --   --  7.8 8.9*  ALBUMIN 2.9* 3.0* 3.1* 3.3* 3.8    CBG: Recent Labs  Lab 01/22/23 2355 01/23/23 0318 01/23/23 0717 01/23/23 1118 01/23/23 1520  GLUCAP 110* 82 74 103* 94      Recent Results (from the past 240 hour(s))  Culture, blood (routine x 2)     Status: None   Collection Time: 01/18/23 10:26 PM   Specimen: BLOOD  Result Value Ref Range Status   Specimen Description BLOOD RIGHT ANTECUBITAL  Final   Special Requests   Final    BOTTLES DRAWN AEROBIC AND ANAEROBIC Blood Culture adequate volume   Culture   Final    NO GROWTH 5 DAYS Performed at [facility], [address], [place], [place] [address]    Report Status 01/23/2023 FINAL  Final  MRSA Next Gen by PCR, Nasal     Status: None   Collection Time: 01/19/23  1:07 AM   Specimen: Nasal Mucosa; Nasal Swab  Result Value Ref Range Status   MRSA by PCR Next Gen NOT DETECTED NOT DETECTED Final    Comment: (NOTE) The GeneXpert MRSA Assay (FDA approved for NASAL specimens only), is one component of [employer] comprehensive MRSA colonization surveillance program. It is not intended to diagnose MRSA infection nor to guide or monitor treatment for MRSA infections. Test performance is not FDA approved in patients less than 54 years old. Performed at [name], [address] [place], [place] [address]   Culture, blood (routine x 2)     Status: None   Collection Time: 01/19/23  2:13 AM   Specimen: BLOOD RIGHT HAND  Result Value Ref Range Status   Specimen Description BLOOD RIGHT HAND  Final   Special Requests   Final    BOTTLES DRAWN AEROBIC AND ANAEROBIC Blood Culture results may not be optimal due to an excessive volume of blood received in culture bottles   Culture   Final    NO GROWTH 5 DAYS Performed at [facility], [address] [place], [place] [address]    Report Status 01/24/2023 FINAL  Final         Radiology Studies: DG CHEST PORT 1 VIEW  Result  Date: 01/26/2023 CLINICAL DATA:  42 year old male under follow-up evaluation. EXAM: PORTABLE CHEST 1 VIEW COMPARISON:  Chest x-ray 01/21/2023. FINDINGS: Previously noted endotracheal tube has been removed. Right internal  jugular PermCath with tip terminating at the superior cavoatrial junction. Lung volumes are normal. No consolidative airspace disease. No pleural effusions. No pneumothorax. No pulmonary nodule or mass noted. Pulmonary vasculature and the cardiomediastinal silhouette are within normal limits. IMPRESSION: 1. Support apparatus, as above. 2. No radiographic evidence of acute cardiopulmonary disease. Electronically Signed   By: Trudie Reed M.D.   On: 01/26/2023 09:09        Scheduled Meds:  amLODipine  10 mg Oral Daily   carvedilol  6.25 mg Oral BID WC   Chlorhexidine Gluconate Cloth  6 each Topical Daily   cinacalcet  60 mg Oral Q supper   darbepoetin (ARANESP) injection - DIALYSIS  60 mcg Subcutaneous Q Mon-1800   feeding supplement (NEPRO CARB STEADY)  237 mL Oral BID BM   heparin injection (subcutaneous)  5,000 Units Subcutaneous Q8H   levETIRAcetam  500 mg Oral BID   multivitamin  1 tablet Oral QHS   sevelamer carbonate  1,600 mg Oral TID WC   Continuous Infusions:  sodium chloride 10 mL/hr at 01/23/23 1400     LOS: 7 days    Time spent: over 30 min    Lacretia Nicks, MD [facility]   To contact the attending provider between 7A-7P or the covering provider during after hours 7P-7A, please log into the web site [email or web address] and access using universal Section password for that web site. If you do not have the password, please call the hospital operator.  01/26/2023, 3:08 PM

## 2023-01-26 NOTE — Progress Notes (Signed)
[facility] night cross cover note:   I was notified by RN that the patient is experiencing insomnia refractory to existing order for Benadryl.  I subsequently ordered melatonin 5 mg p.o. x 1 dose prn for insomnia.     Newton Pigg, DO Hospitalist

## 2023-01-26 NOTE — Progress Notes (Signed)
[facility] night cross cover note:   I was notified by RN that the patient is complaining of some nausea in the absence of vomiting.  I subsequently ordered prn IV Zofran.    Newton Pigg, DO Hospitalist

## 2023-01-27 ENCOUNTER — Inpatient Hospital Stay (HOSPITAL_COMMUNITY): Payer: 59

## 2023-01-27 DIAGNOSIS — I639 Cerebral infarction, unspecified: Secondary | ICD-10-CM

## 2023-01-27 DIAGNOSIS — R569 Unspecified convulsions: Secondary | ICD-10-CM | POA: Diagnosis not present

## 2023-01-27 LAB — LIPID PANEL
Cholesterol: 206 mg/dL — ABNORMAL HIGH (ref 0–200)
HDL: 27 mg/dL — ABNORMAL LOW (ref >40)
LDL Cholesterol: 105 mg/dL — ABNORMAL HIGH (ref 0–99)
Total CHOL/HDL Ratio: 7.6 RATIO
Triglycerides: 371 mg/dL — ABNORMAL HIGH (ref <150)
VLDL: 74 mg/dL — ABNORMAL HIGH (ref 0–40)

## 2023-01-27 LAB — CBC WITH DIFFERENTIAL/PLATELET
Abs Immature Granulocytes: 0.09 10*3/uL — ABNORMAL HIGH (ref 0.00–0.07)
Basophils Absolute: 0.1 10*3/uL (ref 0.0–0.1)
Basophils Relative: 1 %
Eosinophils Absolute: 0.2 10*3/uL (ref 0.0–0.5)
Eosinophils Relative: 2 %
HCT: 38.2 % — ABNORMAL LOW (ref 39.0–52.0)
Hemoglobin: 12.4 g/dL — ABNORMAL LOW (ref 13.0–17.0)
Immature Granulocytes: 1 %
Lymphocytes Relative: 23 %
Lymphs Abs: 1.8 10*3/uL (ref 0.7–4.0)
MCH: 31.1 pg (ref 26.0–34.0)
MCHC: 32.5 g/dL (ref 30.0–36.0)
MCV: 95.7 fL (ref 80.0–100.0)
Monocytes Absolute: 1.3 10*3/uL — ABNORMAL HIGH (ref 0.1–1.0)
Monocytes Relative: 16 %
Neutro Abs: 4.7 10*3/uL (ref 1.7–7.7)
Neutrophils Relative %: 57 %
Platelets: 91 10*3/uL — ABNORMAL LOW (ref 150–400)
RBC: 3.99 MIL/uL — ABNORMAL LOW (ref 4.22–5.81)
RDW: 15.7 % — ABNORMAL HIGH (ref 11.5–15.5)
WBC: 8.2 10*3/uL (ref 4.0–10.5)
nRBC: 1.5 % — ABNORMAL HIGH (ref 0.0–0.2)

## 2023-01-27 LAB — COMPREHENSIVE METABOLIC PANEL WITH GFR
ALT: 24 U/L (ref 0–44)
AST: 22 U/L (ref 15–41)
Albumin: 3.7 g/dL (ref 3.5–5.0)
Alkaline Phosphatase: 87 U/L (ref 38–126)
Anion gap: 18 — ABNORMAL HIGH (ref 5–15)
BUN: 50 mg/dL — ABNORMAL HIGH (ref 6–20)
CO2: 23 mmol/L (ref 22–32)
Calcium: 9.1 mg/dL (ref 8.9–10.3)
Chloride: 93 mmol/L — ABNORMAL LOW (ref 98–111)
Creatinine, Ser: 12.72 mg/dL — ABNORMAL HIGH (ref 0.61–1.24)
GFR, Estimated: 5 mL/min — ABNORMAL LOW (ref >60)
Glucose, Bld: 94 mg/dL (ref 70–99)
Potassium: 4.1 mmol/L (ref 3.5–5.1)
Sodium: 134 mmol/L — ABNORMAL LOW (ref 135–145)
Total Bilirubin: 0.6 mg/dL (ref 0.3–1.2)
Total Protein: 8.5 g/dL — ABNORMAL HIGH (ref 6.5–8.1)

## 2023-01-27 LAB — PHOSPHORUS: Phosphorus: 8.7 mg/dL — ABNORMAL HIGH (ref 2.5–4.6)

## 2023-01-27 LAB — MAGNESIUM: Magnesium: 2.3 mg/dL (ref 1.7–2.4)

## 2023-01-27 MED ORDER — ASPIRIN 325 MG PO TABS
325.0000 mg | ORAL_TABLET | Freq: Every day | ORAL | Status: DC
  Administered 2023-01-27: 325 mg via ORAL
  Filled 2023-01-27: qty 1

## 2023-01-27 MED ORDER — ASPIRIN 300 MG RE SUPP
300.0000 mg | Freq: Every day | RECTAL | Status: DC
  Filled 2023-01-27: qty 1

## 2023-01-27 MED ORDER — CHLORHEXIDINE GLUCONATE CLOTH 2 % EX PADS
6.0000 | MEDICATED_PAD | Freq: Every day | CUTANEOUS | Status: DC
  Administered 2023-01-28 – 2023-02-12 (×13): 6 via TOPICAL

## 2023-01-27 MED ORDER — CARVEDILOL 3.125 MG PO TABS
3.1250 mg | ORAL_TABLET | Freq: Two times a day (BID) | ORAL | Status: DC
  Administered 2023-01-27 – 2023-01-29 (×4): 3.125 mg via ORAL
  Filled 2023-01-27 (×5): qty 1

## 2023-01-27 MED ORDER — APIXABAN 5 MG PO TABS
5.0000 mg | ORAL_TABLET | Freq: Two times a day (BID) | ORAL | Status: DC
  Administered 2023-01-27 – 2023-01-28 (×3): 5 mg via ORAL
  Filled 2023-01-27 (×3): qty 1

## 2023-01-27 MED ORDER — STROKE: EARLY STAGES OF RECOVERY BOOK
Freq: Once | Status: AC
  Filled 2023-01-27: qty 1

## 2023-01-27 MED ORDER — AMLODIPINE BESYLATE 5 MG PO TABS: 5.0000 mg | ORAL_TABLET | Freq: Every day | ORAL | Status: DC

## 2023-01-27 MED ORDER — ATORVASTATIN CALCIUM 40 MG PO TABS
40.0000 mg | ORAL_TABLET | Freq: Every day | ORAL | Status: DC
  Administered 2023-01-27 – 2023-02-13 (×17): 40 mg via ORAL
  Filled 2023-01-27 (×17): qty 1

## 2023-01-27 NOTE — Consult Note (Addendum)
Neurology Consultation Reason for Consult: Stroke on MRI Requesting Physician: Lowell Guitar  CC: Stroke on MRI  History is obtained from:  HPI: [name] [name] is a 42 y.o. male with a past medical history of HTN, seizures on keppra, CVA, ESRD, SLE with APLA who initially presented to [facility] ED for AMS and seizure like activity on 01/18/2023. He was subsequently intubated in the ICU on 8/2 and then extubated on 8/5. He was also found to have C5-6 herniated disc, spinal stenosis, and cervical myelopathy and was seen by neurosurgery who recommend a C5-C6 anterior cervicectomy fusion and plating. He will follow up with neurosurgery after discharge to plan. On 8/10 he had an MRI brain due to persistent lightheadedness. MRI shows a left PCA territory infarct with multiple small foci of acute ischemia.  Unclear compliance with Eliquis and Keppra, however he does acknowledge that he is supposed to be on both medications.  LKW: 8/2 Thrombolytic given?: No IA performed?: No Premorbid modified rankin scale:      0 - No symptoms.   ROS: All other review of systems was negative except as noted in the HPI.   History reviewed. No pertinent past medical history.  History reviewed. No pertinent family history.  Social History:  has no history on file for tobacco use, alcohol use, and drug use.  Exam: Current vital signs: BP 92/68 (BP Location: Right Arm)   Pulse 84   Temp 98.3 F (36.8 C) (Oral)   Resp 16   Ht 6\' 1"  (1.854 m)   Wt 105.5 kg   SpO2 94%   BMI 30.69 kg/m  Vital signs in last 24 hours: Temp:  [97.7 F (36.5 C)-99.7 F (37.6 C)] 98.3 F (36.8 C) (08/11 0600) Pulse Rate:  [84-115] 84 (08/11 0600) Resp:  [16-18] 16 (08/11 0600) BP: (92-182)/(68-110) 92/68 (08/11 0600) SpO2:  [94 %-100 %] 94 % (08/11 0600) Weight:  [105.5 kg] 105.5 kg (08/11 0500)   Physical Exam  Constitutional: Appears well-developed and well-nourished.  Psych: Affect appropriate to situation,  Eyes: No scleral  injection HENT: No oropharyngeal obstruction.  MSK: no joint deformities.  Cardiovascular: Normal rate and regular rhythm.  Perfusing extremities well Respiratory: Effort normal, non-labored breathing GI: Soft.  No distension. There is no tenderness.  Skin: Warm dry and intact visible skin  Neuro: Mental Status: Patient is awake, alert, oriented to person, place, month, year.  Patient is able to give a clear and coherent history. Limited understanding of why he is in the hospital, does not remember events leading to hospitalization.  No signs of aphasia or neglect Cranial Nerves: II: Visual Fields are full. Pupils are equal, round, and reactive to light.   III,IV, VI: EOMI without ptosis or diploplia.  V: Facial sensation is symmetric to temperature VII: Facial movement is symmetric.  VIII: hearing is intact to voice X: Uvula elevates symmetrically XI: Shoulder shrug is symmetric. XII: tongue is midline without atrophy or fasciculations.  Motor: Tone is normal. Bulk is normal.  RUE 3+/5    LUE 5/5 RLE 4/5 with slight drift LLE 5/5 Sensory: Sensation is symmetric to light touch  in the arms and legs. Cerebellar: FNF and HKS are intact bilaterally Dysmetria noted in RUE and RLE out of proportion to weakness No truncal ataxia noted Gait:  Deferred in acute setting   1a Level of Conscious.: 0 1b LOC Questions: 0 1c LOC Commands: 0 2 Best Gaze: 0 3 Visual: 0 4 Facial Palsy: 0 5a Motor Arm -  left: 0 5b Motor Arm - Right: 2 6a Motor Leg - Left: 0 6b Motor Leg - Right: 1 7 Limb Ataxia: 1 8 Sensory: 0 9 Best Language: 0 10 Dysarthria: 0 11 Extinct. and Inatten.: 0 TOTAL: 4    I have reviewed labs in epic and the results pertinent to this consultation are:  Basic Metabolic Panel: Recent Labs  Lab 01/21/23 1831 01/22/23 0502 01/23/23 0433 01/24/23 0059 01/25/23 0350 01/26/23 0423 01/27/23 0351  NA  --    < > 135 135 134* 133* 134*  K  --    < > 3.9 3.9 3.9 4.2  4.1  CL  --    < > 95* 94* 93* 93* 93*  CO2  --    < > 23 24 23 22 23   GLUCOSE  --    < > 86 85 91 80 94  BUN  --    < > 31* 47* 33* 32* 50*  CREATININE  --    < > 10.20* 12.72* 10.38* 9.29* 12.72*  CALCIUM  --    < > 9.2 8.9 9.2 9.6 9.1  MG 2.2  --   --  2.3 2.1 2.1 2.3  PHOS 7.9*   < > 7.8* 6.5* 6.1* 6.6* 8.7*   < > = values in this interval not displayed.    CBC: Recent Labs  Lab 01/23/23 0433 01/24/23 0059 01/25/23 0350 01/26/23 0423 01/27/23 0351  WBC 5.9 7.6 7.4 9.8 8.2  NEUTROABS  --  5.0  --  6.4 4.7  HGB 10.1* 10.2* 11.2* 12.9* 12.4*  HCT 31.4* 31.3* 33.6* 39.3 38.2*  MCV 95.4 94.0 94.1 94.0 95.7  PLT 100* 117* 106* 91* 91*    Coagulation Studies: No results for input(s): "LABPROT", "INR" in the last 72 hours.   Lab Results  Component Value Date   CHOL 206 (H) 01/27/2023   HDL 27 (L) 01/27/2023   LDLCALC 105 (H) 01/27/2023   TRIG 371 (H) 01/27/2023   CHOLHDL 7.6 01/27/2023   Lab Results  Component Value Date   HGBA1C 5.2 01/19/2023    Component     Latest Ref Rng 11/08/2022 12/28/2022 01/02/2023  Levetiracetam, S     10.0 - 40.0 ug/mL <2.0 (L)  <2.0 (L)  10.5    Legend: (L) Low  ECHO  1. Left ventricular ejection fraction, by estimation, is 55 to 60%. The  left ventricle has normal function. The left ventricle has no regional  wall motion abnormalities. There is severe left ventricular hypertrophy.  Left ventricular diastolic parameters   are indeterminate.   2. Right ventricular systolic function is mildly reduced. The right  ventricular size is normal. Tricuspid regurgitation signal is inadequate  for assessing PA pressure.   3. Left atrial size was mildly dilated.   4. The mitral valve is degenerative. Mild to moderate mitral valve  regurgitation. Mild mitral stenosis. The mean mitral valve gradient is 5.1  mmHg with average heart rate of 67 bpm. Severe mitral annular  calcification.   5. The aortic valve is grossly normal. There is mild  calcification of the  aortic valve. Aortic valve regurgitation is not visualized. Aortic valve  sclerosis/calcification is present, without any evidence of aortic  stenosis.   I have reviewed the images obtained:  MRI brain 8/10  1. Acute infarct of the left PCA territory. No hemorrhage or mass effect. 2. Multiple small foci of acute ischemia in the left cerebellar hemisphere. Single focus of acute ischemia in  the right cerebellum.  MRI C-spine 8/5 1. Large central disc extrusion with superior migration at C5-6 with severe spinal canal stenosis and compression of the spinal cord. Signal changes of compressive myelopathy at the C5 level. 2. Small amount of ventral epidural fluid at the C6-T1 levels, approximately 2 mm thick. This may be a small epidural hematoma/effusion secondary to acute disc herniation. 3. Diffuse low T1/T2-weighted signal within the bone marrow may be related to anemia or a myeloproliferative disorder.   Assessment:  - Exam reveals _____ - MRA head _____ -   Impression:  - Acute left PCA stroke, embolic in appearance, with small bilateral cerebellar strokes, suspect cardioembolic source - Seizure disorder  - C5/6 cervical myleopathy planned for non-urgent fusion/plating with neurosurgery in the future - SLE with APLA supposed to be on eliquis 2.5mg  BID  Recommendations: # stroke - Stroke labs TSH, ESR, RPR, HgbA1c, fasting lipid panel - MRI brain  - Carotid duplex  - Frequent neuro checks - Echocardiogram - Resume eliquis 2.5mg  BID - Risk factor modification - Blood pressure goal   - Permissive hypertension to 220/120 due to acute stroke - PT consult, OT consult, Speech consult, unless patient is back to baseline - Stroke team to follow  # seizure disorder - continue keppra 500mg  BID - 2mg  Ativan for seizures lasting longer than 5 minutes - Seizure precautions   Seizure precautions: Per [name] statutes, patients with seizures are  not allowed to drive until they have been seizure-free for six months and cleared by a physician. Use caution when using heavy equipment or power tools. Avoid working on ladders or at heights. Take showers instead of baths. Ensure the water temperature is not too high on the home water heater. Do not go swimming alone. Do not lock yourself in a room alone (i.e. bathroom). When caring for infants or small children, sit down when holding, feeding, or changing them to minimize risk of injury to the child in the event you have a seizure. Maintain good sleep hygiene. Avoid alcohol.       Total critical care time: ____ minutes   Critical care time was exclusive of separately billable procedures and treating other patients.   Critical care was necessary to treat or prevent imminent or life-threatening deterioration.   Critical care was time spent personally by me on the following activities: development of treatment plan with patient and/or surrogate as well as nursing, discussions with consultants/primary team, evaluation of patient's response to treatment, examination of patient, obtaining history from patient or surrogate, ordering and performing treatments and interventions, ordering and review of laboratory studies, ordering and review of radiographic studies, and re-evaluation of patient's condition as needed, as documented above.

## 2023-01-27 NOTE — Progress Notes (Addendum)
TR[facility] night cross cover note:   I was notified by RN that this patient's MRI brain, which was completed around 2000 on 8/10, has now resulted around 0400 today.   Per chart review, MRI brain was ordered to evaluate the patient's lightheadedness, which appears to have been occurring since at least 01/25/23.   MRI brain shows acute infarct in the left PCA territory without evidence of hemorrhage or mass effect, also showing multiple small foci of acute ischemia in the left cerebellar hemisphere as well as a single focus of acute ischemia in the right cerebellum.  Given the timing of onset of the patient's lightheadedness, he is not a candidate for consideration for tPA nor a candidate for consideration for thrombectomy.  However, given the appearance of acute stroke, I'll order lipid panel with AM labs. It appears that he has recently undergone echo on 01/19/23. As patient recently underwent hemoglobin A1c evaluation on 01/19/2023 (5.2%), will refrain from repeating hemoglobin A1c level at this time. Consideration for CTA of the head and neck, although renal function is noted.     Newton Pigg, DO Hospitalist

## 2023-01-27 NOTE — Progress Notes (Addendum)
PROGRESS NOTE    [name] [name]  ZOX:[id number] DOB: 11-08-1980 DOA: 01/18/2023 PCP: No primary care provider on file.  Chief Complaint  Patient presents with   Seizures    Brief Narrative:   42 year old with [name] [name] history of ESRD on HD MWF via R tunneled IJ dialysis catheter, seizure disorder, lupus nephritis, and HTN who was admitted to the ICU 01/18/2023 after he was found down at [name] [employer] following [name] [name] seizure with subsequent altered mental status and [name] [name] suspected aspiration event. He required intubation at presentation and was admitted to the ICU.   Significant Events: 8/2 admitted after seizure with resultant hypoxic respiratory failure -intubation 8/3 HD initiated for volume overload -stopped due to hypotension 8/3 EEG noted diffuse slowing but no seizure or epileptogenicity 8/5 extubated -found to have right arm weakness/unable to lift right arm above shoulder 8/6 TR[facility] assumed care - transferred to Med/Surg bed  8/10 MRI with acute stroke   Assessment & Plan:   Principal Problem:   Seizure [name] [name]) Active Problems:   Acute respiratory failure with hypoxia (HCC)  Vertigo  Acute Stroke 8/9 dizziness noted with walking, ataxia on L side noted on 8/10 MRI as below MRI brain with acute infarct of L PCA territory, multiple small foci of acute ischemia in the L cerebellar hemisphere, single focus of acute ischemia in the R cerebellum MRA head without contrast pending Carotid US pending Echo with EF 55-60%, no RWMA, severe LV hypertrophy, mildly reduced RVSF, mild to moderate MV regurgitation Telemetry LDL 105, A1c 5.2 Aspirin, statin - appreciate neurology recommendations regarding resumption of his home eliquis (patient with hx of lupus anticoagulant, was supposed to be on this prior to admission, but per discussion with pharmacy, last picked up Dec 2023 for 90 day supply) PT, OT, SLP Will continue to monitor as he continues to mobilize.  Seizure No recurrent events  since admission -continue Keppra -suspected poor compliance with home AEDs Of note, this chart marked for merged, no MRI in this chart, but MRI 07/2022 in chart to be merged motion limited, but without acute abnormalities, extensive periventricular T2/FLAIR hyperintense signal abnormality which correlates with the regions of hypodensity seen on CT brain dated 04/29/2019. This is favored to represent changes related to [name] [name] combination of chronic frontal parietal cortical infarcts superimposed on [name] [name] background of severe chronic microvascular ischemic.  Doesn't appear he's been to see an outpatient neurologist.    Per [name] statutes, patients with seizures are not allowed to drive until  they have been seizure-free for six months. Use caution when using heavy equipment or power tools. Avoid working on ladders or at heights. Take showers instead of baths. Ensure the water temperature is not too high on the home water heater. Do not go swimming alone. When caring for infants or small children, sit down when holding, feeding, or changing them to minimize risk of injury to the child in the event you have [name] seizure. Also, Maintain good sleep hygiene. Avoid alcohol.  Aspiration pneumonitis -acute hypoxic respiratory failure Successfully extubated 8/5 -empiric Unasyn through 8/6 -no persisting clinical signs or symptoms of infection   Shock - septic versus sedation induced Required transient use of pressor support -resolved    ESRD on HD MWF Care as per Nephrology   Metabolic encephalopathy due to seizure activity and hypoxia Mental status slowly improving - cont to monitor w/ time  Delirium precautions   Right shoulder pain/weakness -cervical spondylosis/herniated disc with cervical stenosis/myelopathy Cervical MRI reveals  herniated disc -right shoulder x-rays suggest degenerative change -Neurosurgery has evaluated and feels he has [name] [name] chronic cervical myelopathy -[name] [name] C5-6 anterior cervicectomy fusion  and plating has been recommended but is not felt to be needed acutely -outpatient follow-up with Neurosurgery will be indicated   Anemia of chronic kidney disease Hemoglobin stable - erythropoietin per Nephrology   SLE Not followed by Rheumatology He needs to reestablish with rheum, last saw in 2022 that I saw   Chronic thrombocytopenia Has not kept scheduled appointments with Hematology Trend    HTN  BP on soft side, reduced dose of BP meds, appreciate renal   DM has been ruled out    Peer to peer for inpatient rehab unfortunately declined.    DVT prophylaxis: heparin Code Status: full Family Communication: none Disposition:   Status is: Inpatient Remains inpatient appropriate because: need for continued inpatient care   Consultants:  Neurosurgery PCCM  Procedures:  intubation  Antimicrobials:  Anti-infectives (From admission, onward)    Start     Dose/Rate Route Frequency Ordered Stop   01/20/23 1745  ampicillin-sulbactam (UNASYN) 1.5 g in sodium chloride 0.9 % 100 mL IVPB        1.5 g 200 mL/hr over 30 Minutes Intravenous Every 12 hours 01/20/23 1019 01/23/23 1814   01/19/23 0530  ampicillin-sulbactam (UNASYN) 1.5 g in sodium chloride 0.9 % 100 mL IVPB  Status:  Discontinued        1.5 g 200 mL/hr over 30 Minutes Intravenous Every 12 hours 01/19/23 0445 01/20/23 1019   01/18/23 2230  vancomycin (VANCOCIN) IVPB 1000 mg/200 mL premix  Status:  Discontinued        1,000 mg 200 mL/hr over 60 Minutes Intravenous  Once 01/18/23 2218 01/18/23 2222   01/18/23 2230  metroNIDAZOLE (FLAGYL) IVPB 500 mg        500 mg 100 mL/hr over 60 Minutes Intravenous  Once 01/18/23 2218 01/19/23 0047   01/18/23 2230  ceFEPIme (MAXIPIME) 2 g in sodium chloride 0.9 % 100 mL IVPB        2 g 200 mL/hr over 30 Minutes Intravenous  Once 01/18/23 2222 01/18/23 2323   01/18/23 2230  vancomycin (VANCOREADY) IVPB 2000 mg/400 mL        2,000 mg 200 mL/hr over 120 Minutes Intravenous  Once  01/18/23 2222 01/19/23 0415       Subjective: Tired, no complaints, sleeping when I saw him  Objective: Vitals:   01/27/23 0419 01/27/23 0454 01/27/23 0500 01/27/23 0600  BP: 92/74 96/75  92/68  Pulse: 93 90  84  Resp: 18 16  16   Temp: 97.8 F (36.6 C) 98.2 F (36.8 C)  98.3 F (36.8 C)  TempSrc:  Oral  Oral  SpO2: 97% 96%  94%  Weight:   105.5 kg   Height:        Intake/Output Summary (Last 24 hours) at 01/27/2023 1314 Last data filed at 01/27/2023 0900 Gross per 24 hour  Intake 360 ml  Output --  Net 360 ml    Filed Weights   01/25/23 1213 01/26/23 0331 01/27/23 0500  Weight: 104.8 kg 105.2 kg 105.5 kg    Examination:  General: No acute distress. Lungs: unlabored Neurological: Alert, moving all extremities Extremities: No clubbing or cyanosis. No edema.  Data Reviewed: I have personally reviewed following labs and imaging studies  CBC: Recent Labs  Lab 01/23/23 0433 01/24/23 0059 01/25/23 0350 01/26/23 0423 01/27/23 0351  WBC 5.9 7.6 7.4 9.8 8.2  NEUTROABS  --  5.0  --  6.4 4.7  HGB 10.1* 10.2* 11.2* 12.9* 12.4*  HCT 31.4* 31.3* 33.6* 39.3 38.2*  MCV 95.4 94.0 94.1 94.0 95.7  PLT 100* 117* 106* 91* 91*    Basic Metabolic Panel: Recent Labs  Lab 01/21/23 1831 01/22/23 0502 01/23/23 0433 01/24/23 0059 01/25/23 0350 01/26/23 0423 01/27/23 0351  NA  --    < > 135 135 134* 133* 134*  K  --    < > 3.9 3.9 3.9 4.2 4.1  CL  --    < > 95* 94* 93* 93* 93*  CO2  --    < > 23 24 23 22 23   GLUCOSE  --    < > 86 85 91 80 94  BUN  --    < > 31* 47* 33* 32* 50*  CREATININE  --    < > 10.20* 12.72* 10.38* 9.29* 12.72*  CALCIUM  --    < > 9.2 8.9 9.2 9.6 9.1  MG 2.2  --   --  2.3 2.1 2.1 2.3  PHOS 7.9*   < > 7.8* 6.5* 6.1* 6.6* 8.7*   < > = values in this interval not displayed.    GFR: Estimated Creatinine Clearance: 9.5 mL/min (Darriona Dehaas) (by C-G formula based on SCr of 12.72 mg/dL (H)).  Liver Function Tests: Recent Labs  Lab 01/22/23 0502  01/23/23 0433 01/24/23 0059 01/26/23 0423 01/27/23 0351  AST  --   --  31 29 22   ALT  --   --  20 25 24   ALKPHOS  --   --  78 84 87  BILITOT  --   --  0.9 0.6 0.6  PROT  --   --  7.8 8.9* 8.5*  ALBUMIN 3.0* 3.1* 3.3* 3.8 3.7    CBG: Recent Labs  Lab 01/22/23 2355 01/23/23 0318 01/23/23 0717 01/23/23 1118 01/23/23 1520  GLUCAP 110* 82 74 103* 94     Recent Results (from the past 240 hour(s))  Culture, blood (routine x 2)     Status: None   Collection Time: 01/18/23 10:26 PM   Specimen: BLOOD  Result Value Ref Range Status   Specimen Description BLOOD RIGHT ANTECUBITAL  Final   Special Requests   Final    BOTTLES DRAWN AEROBIC AND ANAEROBIC Blood Culture adequate volume   Culture   Final    NO GROWTH 5 DAYS Performed at [facility], [address] [place], [place] [address]    Report Status 01/23/2023 FINAL  Final  MRSA Next Gen by PCR, Nasal     Status: None   Collection Time: 01/19/23  1:07 AM   Specimen: Nasal Mucosa; Nasal Swab  Result Value Ref Range Status   MRSA by PCR Next Gen NOT DETECTED NOT DETECTED Final    Comment: (NOTE) The GeneXpert MRSA Assay (FDA approved for NASAL specimens only), is one component of Kaesen Rodriguez comprehensive MRSA colonization surveillance program. It is not intended to diagnose MRSA infection nor to guide or monitor treatment for MRSA infections. Test performance is not FDA approved in patients less than 15 years old. Performed at [name], [address], [place], [place] [address]   Culture, blood (routine x 2)     Status: None   Collection Time: 01/19/23  2:13 AM   Specimen: BLOOD RIGHT HAND  Result Value Ref Range Status   Specimen Description BLOOD RIGHT HAND  Final   Special Requests  Final    BOTTLES DRAWN AEROBIC AND ANAEROBIC Blood Culture results may not be optimal due to an excessive volume of blood received in culture bottles   Culture   Final    NO GROWTH 5 DAYS Performed at [facility],  [address] [place], [place] [address]    Report Status 01/24/2023 FINAL  Final         Radiology Studies: MR BRAIN WO CONTRAST  Result Date: 01/26/2023 CLINICAL DATA:  Acute neurologic deficit EXAM: MRI HEAD WITHOUT CONTRAST TECHNIQUE: Multiplanar, multiecho pulse sequences of the brain and surrounding structures were obtained without intravenous contrast. COMPARISON:  08/10/2022 FINDINGS: Brain: Acute infarct of the left PCA territory multiple small foci of acute ischemia in the left cerebellar hemisphere. Single focus of acute ischemia in the right cerebellum. Punctate areas of hyperintensity on diffusion-weighted imaging along both internal capsule posterior limb there is have no ADC correlate. No acute or chronic hemorrhage. There is confluent hyperintense T2-weighted signal within the white matter. Generalized volume loss. Old left parietal infarct. Vascular: Major flow voids are preserved. Skull and upper cervical spine: Normal calvarium and skull base. Visualized upper cervical spine and soft tissues are normal. Sinuses/Orbits:No paranasal sinus fluid levels or advanced mucosal thickening. No mastoid or middle ear effusion. Normal orbits. IMPRESSION: 1. Acute infarct of the left PCA territory. No hemorrhage or mass effect. 2. Multiple small foci of acute ischemia in the left cerebellar hemisphere. Single focus of acute ischemia in the right cerebellum. Electronically Signed   By: Deatra Robinson M.D.   On: 01/26/2023 21:18   DG CHEST PORT 1 VIEW  Result Date: 01/26/2023 CLINICAL DATA:  42 year old male under follow-up evaluation. EXAM: PORTABLE CHEST 1 VIEW COMPARISON:  Chest x-ray 01/21/2023. FINDINGS: Previously noted endotracheal tube has been removed. Right internal jugular PermCath with tip terminating at the superior cavoatrial junction. Lung volumes are normal. No consolidative airspace disease. No pleural effusions. No pneumothorax. No pulmonary nodule or mass noted. Pulmonary  vasculature and the cardiomediastinal silhouette are within normal limits. IMPRESSION: 1. Support apparatus, as above. 2. No radiographic evidence of acute cardiopulmonary disease. Electronically Signed   By: Trudie Reed M.D.   On: 01/26/2023 09:09        Scheduled Meds:  [START ON 01/28/2023]  stroke: early stages of recovery book   Does not apply Once   amLODipine  10 mg Oral Daily   aspirin  300 mg Rectal Daily   Or   aspirin  325 mg Oral Daily   atorvastatin  40 mg Oral Daily   carvedilol  6.25 mg Oral BID WC   Chlorhexidine Gluconate Cloth  6 each Topical Daily   cinacalcet  60 mg Oral Q supper   darbepoetin (ARANESP) injection - DIALYSIS  60 mcg Subcutaneous Q Mon-1800   feeding supplement (NEPRO CARB STEADY)  237 mL Oral BID BM   heparin injection (subcutaneous)  5,000 Units Subcutaneous Q8H   levETIRAcetam  500 mg Oral BID   multivitamin  1 tablet Oral QHS   sevelamer carbonate  1,600 mg Oral TID WC   Continuous Infusions:  sodium chloride 10 mL/hr at 01/23/23 1400     LOS: 8 days    Time spent: over 30 min    Lacretia Nicks, MD [facility]   To contact the attending provider between 7A-7P or the covering provider during after hours 7P-7A, please log into the web site [email or web address] and access using universal [place] password for that web site.  If you do not have the password, please call the hospital operator.  01/27/2023, 1:14 PM

## 2023-01-27 NOTE — Progress Notes (Signed)
ANTICOAGULATION CONSULT NOTE - Initial Consult  Pharmacy Consult for apixaban Indication:  SLE with APLA  Allergies  Allergen Reactions   Cetirizine & Related Swelling    Patient Measurements: Height: 6\' 1"  (185.4 cm) Weight: 105.5 kg (232 lb 9.4 oz) IBW/kg (Calculated) : 79.9  Vital Signs: Temp: 98.3 F (36.8 C) (08/11 0600) Temp Source: Oral (08/11 0600) BP: 92/68 (08/11 0600) Pulse Rate: 84 (08/11 0600)  Labs: Recent Labs    01/25/23 0350 01/26/23 0423 01/27/23 0351  HGB 11.2* 12.9* 12.4*  HCT 33.6* 39.3 38.2*  PLT 106* 91* 91*  CREATININE 10.38* 9.29* 12.72*    Estimated Creatinine Clearance: 9.5 mL/min (A) (by C-G formula based on SCr of 12.72 mg/dL (H)).   Medical History: History reviewed. No pertinent past medical history.  Medications:  Medications Prior to Admission  Medication Sig Dispense Refill Last Dose   amLODipine (NORVASC) 10 MG tablet Take 10 mg by mouth at bedtime.   Past Month   carvedilol (COREG) 6.25 MG tablet Take 6.25 mg by mouth 2 (two) times daily.   Past Month   levETIRAcetam (KEPPRA) 1000 MG tablet Take 1,000 mg by mouth daily.   Past Month   SENSIPAR 60 MG tablet Take 60 mg by mouth daily.   Past Month    Assessment: 41yoM with hx of lupus anticoagulant, was supposed to be on apixaban prior to admission, but last picked up Dec 2023 for 90 day supply. Pharmacy consulted to restart apixaban.CBC stable. Has been on subQ heparin for VTE prophylaxis with last dose this morning @ 0556.  Goal of Therapy:  Therapeutic anticoagulation Monitor platelets by anticoagulation protocol: Yes   Plan:  Start apixaban 5 mg PO BID Monitor for signs/symptoms of bleeding    Thank you for allowing pharmacy to be a part of this patient's care.   Signe Colt, PharmD 01/27/2023 1:31 PM  **Pharmacist phone directory can be found on [email or web address] listed under [facility]**

## 2023-01-27 NOTE — Plan of Care (Signed)
Pt alert and oriented x 3-4 at times confused to time. With am NIH pt states it is October. Pt agitated and restless during night frequently getting up. Gait unsteady. Related to right side weakness from previous stroke many years ago per pt. Floor mat placed. Pt complained of nausea. Provider contacted for zofran. Zofran ordered and given. Pt later requested med for headache/itching. Pt given tylenol and benadryl. MRI resulted at 0400 from previous order On 8/10 from when pt had dizziness/lightheaded. MRI completed 2000. Msg Provider Dr. Arlean Hopping of results. Msg provider regarding increased vitals, neuro and NIH related to MRI results. Provider made aware NIH RN obtained was 8. Pt states from previous stroke. Unsure of dysarthria isn't related to benadryl. At times speech is clear when speaking. Provider placed order neuro/nih/vitals every 4. Vitals stable. BS x 4. Last bm 8/8. Ambulates to bathroom standby.  Problem: Education: Goal: Ability to describe self-care measures that may prevent or decrease complications (Diabetes Survival Skills Education) will improve Outcome: Progressing Goal: Individualized Educational Video(s) Outcome: Progressing   Problem: Coping: Goal: Ability to adjust to condition or change in health will improve Outcome: Progressing   Problem: Fluid Volume: Goal: Ability to maintain a balanced intake and output will improve Outcome: Progressing   Problem: Health Behavior/Discharge Planning: Goal: Ability to identify and utilize available resources and services will improve Outcome: Progressing Goal: Ability to manage health-related needs will improve Outcome: Progressing   Problem: Metabolic: Goal: Ability to maintain appropriate glucose levels will improve Outcome: Progressing   Problem: Nutritional: Goal: Maintenance of adequate nutrition will improve Outcome: Progressing Goal: Progress toward achieving an optimal weight will improve Outcome: Progressing    Problem: Skin Integrity: Goal: Risk for impaired skin integrity will decrease Outcome: Progressing   Problem: Tissue Perfusion: Goal: Adequacy of tissue perfusion will improve Outcome: Progressing   Problem: Education: Goal: Knowledge of General Education information will improve Description: Including pain rating scale, medication(s)/side effects and non-pharmacologic comfort measures Outcome: Progressing   Problem: Health Behavior/Discharge Planning: Goal: Ability to manage health-related needs will improve Outcome: Progressing   Problem: Clinical Measurements: Goal: Ability to maintain clinical measurements within normal limits will improve Outcome: Progressing Goal: Will remain free from infection Outcome: Progressing Goal: Diagnostic test results will improve Outcome: Progressing Goal: Respiratory complications will improve Outcome: Progressing Goal: Cardiovascular complication will be avoided Outcome: Progressing   Problem: Activity: Goal: Risk for activity intolerance will decrease Outcome: Progressing   Problem: Nutrition: Goal: Adequate nutrition will be maintained Outcome: Progressing   Problem: Coping: Goal: Level of anxiety will decrease Outcome: Progressing   Problem: Elimination: Goal: Will not experience complications related to bowel motility Outcome: Progressing Goal: Will not experience complications related to urinary retention Outcome: Progressing   Problem: Pain Managment: Goal: General experience of comfort will improve Outcome: Progressing   Problem: Safety: Goal: Ability to remain free from injury will improve Outcome: Progressing   Problem: Skin Integrity: Goal: Risk for impaired skin integrity will decrease Outcome: Progressing

## 2023-01-27 NOTE — Plan of Care (Signed)
  Problem: Education: Goal: Ability to describe self-care measures that may prevent or decrease complications (Diabetes Survival Skills Education) will improve Outcome: Progressing Goal: Individualized Educational Video(s) Outcome: Progressing   Problem: Coping: Goal: Ability to adjust to condition or change in health will improve Outcome: Progressing   Problem: Fluid Volume: Goal: Ability to maintain a balanced intake and output will improve Outcome: Progressing   Problem: Health Behavior/Discharge Planning: Goal: Ability to identify and utilize available resources and services will improve Outcome: Progressing Goal: Ability to manage health-related needs will improve Outcome: Progressing   Problem: Metabolic: Goal: Ability to maintain appropriate glucose levels will improve Outcome: Progressing   Problem: Nutritional: Goal: Maintenance of adequate nutrition will improve Outcome: Progressing Goal: Progress toward achieving an optimal weight will improve Outcome: Progressing   Problem: Skin Integrity: Goal: Risk for impaired skin integrity will decrease Outcome: Progressing   Problem: Tissue Perfusion: Goal: Adequacy of tissue perfusion will improve Outcome: Progressing   Problem: Education: Goal: Knowledge of General Education information will improve Description: Including pain rating scale, medication(s)/side effects and non-pharmacologic comfort measures Outcome: Progressing   Problem: Health Behavior/Discharge Planning: Goal: Ability to manage health-related needs will improve Outcome: Progressing   Problem: Clinical Measurements: Goal: Ability to maintain clinical measurements within normal limits will improve Outcome: Progressing Goal: Will remain free from infection Outcome: Progressing Goal: Diagnostic test results will improve Outcome: Progressing Goal: Respiratory complications will improve Outcome: Progressing Goal: Cardiovascular complication will  be avoided Outcome: Progressing   Problem: Activity: Goal: Risk for activity intolerance will decrease Outcome: Progressing   Problem: Nutrition: Goal: Adequate nutrition will be maintained Outcome: Progressing   Problem: Coping: Goal: Level of anxiety will decrease Outcome: Progressing   Problem: Elimination: Goal: Will not experience complications related to bowel motility Outcome: Progressing Goal: Will not experience complications related to urinary retention Outcome: Progressing   Problem: Pain Managment: Goal: General experience of comfort will improve Outcome: Progressing   Problem: Safety: Goal: Ability to remain free from injury will improve Outcome: Progressing   Problem: Skin Integrity: Goal: Risk for impaired skin integrity will decrease Outcome: Progressing   Problem: Education: Goal: Knowledge of disease or condition will improve Outcome: Progressing Goal: Knowledge of secondary prevention will improve (MUST DOCUMENT ALL) Outcome: Progressing Goal: Knowledge of patient specific risk factors will improve (Mark N/A or DELETE if not current risk factor) Outcome: Progressing   Problem: Ischemic Stroke/TIA Tissue Perfusion: Goal: Complications of ischemic stroke/TIA will be minimized Outcome: Progressing   Problem: Coping: Goal: Will verbalize positive feelings about self Outcome: Progressing Goal: Will identify appropriate support needs Outcome: Progressing   Problem: Health Behavior/Discharge Planning: Goal: Ability to manage health-related needs will improve Outcome: Progressing Goal: Goals will be collaboratively established with patient/family Outcome: Progressing   Problem: Self-Care: Goal: Ability to participate in self-care as condition permits will improve Outcome: Progressing Goal: Verbalization of feelings and concerns over difficulty with self-care will improve Outcome: Progressing Goal: Ability to communicate needs accurately will  improve Outcome: Progressing   Problem: Nutrition: Goal: Risk of aspiration will decrease Outcome: Progressing Goal: Dietary intake will improve Outcome: Progressing   

## 2023-01-27 NOTE — Progress Notes (Addendum)
  [facility] Progress Note   Subjective:   alert, seen in room. Denies c/o's. Per pmd MRI done last night for lightheadness is showing an acute infarct of the L PCA territory.   Objective Vitals:   01/27/23 0419 01/27/23 0454 01/27/23 0500 01/27/23 0600  BP: 92/74 96/75  92/68  Pulse: 93 90  84  Resp: 18 16  16   Temp: 97.8 F (36.6 C) 98.2 F (36.8 C)  98.3 F (36.8 C)  TempSrc:  Oral  Oral  SpO2: 97% 96%  94%  Weight:   105.5 kg   Height:       Physical Exam General: alert, BP's good Heart: SB Lungs: CTA bilat Abdomen: soft Extremities: trace edema Dialysis Access: [facility] c/d/i    Dialysis Orders: [facility] MWF 4h   400/800 112.8kg  2/2 bath  TDC  Heparin none - Mircera 120 mcg IV q 2 weeks (last dose 01/16/2023) - Venofer 50 mg IV q week   Assessment/Plan: Seizures: Had not been taking OP meds. Found down at [employer]. Better. Management per primary.  Acute CVA - per MRI done 8/10, yesterday. Cerebellar CVA w/o hemorrhage.  Acute hypoxic respiratory failure: pulm edema + possible aspiration PNA. Rx'd w/ HD and IV abx. Resolved.   Volume - 7 kg under dry wt. Repeat CXR 8/10 showed resolution of edema. Looks like a good new dry wt at 106kg.   BP - BP's are labile with some in the 90's. Will lower norvasc/ coreg by 50% and added hold orders for sbp < 110.   ESRD -  MWF HD. Next HD Monday.   Anemia  - Hb dropped early this admit into 7's, but with volume removal and esa Rx, Hb has improved up to 10-12 here. Cont darbe 60 mcg sq weekly while here.   Metabolic bone disease -  Corrected Ca+ 10 PO4 elevated. Started renvela as binder and resume sensipar 60mg  every day.   Nutrition - Albumin 3.0 Renal diet/protein supplements when able to eat.    [name] Moselle  MD  [facility] 01/27/2023, 12:38 PM  Recent Labs  Lab 01/26/23 0423 01/27/23 0351  HGB 12.9* 12.4*  ALBUMIN 3.8 3.7  CALCIUM 9.6 9.1  PHOS 6.6* 8.7*  CREATININE 9.29* 12.72*  K 4.2 4.1     Inpatient medications:  [START ON 01/28/2023]  stroke: early stages of recovery book   Does not apply Once   amLODipine  10 mg Oral Daily   aspirin  300 mg Rectal Daily   Or   aspirin  325 mg Oral Daily   atorvastatin  40 mg Oral Daily   carvedilol  6.25 mg Oral BID WC   Chlorhexidine Gluconate Cloth  6 each Topical Daily   cinacalcet  60 mg Oral Q supper   darbepoetin (ARANESP) injection - DIALYSIS  60 mcg Subcutaneous Q Mon-1800   feeding supplement (NEPRO CARB STEADY)  237 mL Oral BID BM   heparin injection (subcutaneous)  5,000 Units Subcutaneous Q8H   levETIRAcetam  500 mg Oral BID   multivitamin  1 tablet Oral QHS   sevelamer carbonate  1,600 mg Oral TID WC    sodium chloride 10 mL/hr at 01/23/23 1400   acetaminophen, diphenhydrAMINE, docusate sodium, hydrALAZINE, LORazepam, ondansetron (ZOFRAN) IV, mouth rinse, polyethylene glycol

## 2023-01-28 ENCOUNTER — Inpatient Hospital Stay (HOSPITAL_COMMUNITY): Payer: 59

## 2023-01-28 DIAGNOSIS — I639 Cerebral infarction, unspecified: Secondary | ICD-10-CM

## 2023-01-28 DIAGNOSIS — R569 Unspecified convulsions: Secondary | ICD-10-CM | POA: Diagnosis not present

## 2023-01-28 LAB — HIV ANTIBODY (ROUTINE TESTING W REFLEX): HIV Screen 4th Generation wRfx: NONREACTIVE

## 2023-01-28 LAB — TSH: TSH: 1.23 u[IU]/mL (ref 0.350–4.500)

## 2023-01-28 LAB — SEDIMENTATION RATE: Sed Rate: 62 mm/hr — ABNORMAL HIGH (ref 0–16)

## 2023-01-28 LAB — C-REACTIVE PROTEIN: CRP: 1.4 mg/dL — ABNORMAL HIGH (ref <1.0)

## 2023-01-28 MED ORDER — ENOXAPARIN SODIUM 100 MG/ML IJ SOSY
100.0000 mg | PREFILLED_SYRINGE | Freq: Once | INTRAMUSCULAR | Status: AC
  Administered 2023-01-29: 100 mg via SUBCUTANEOUS
  Filled 2023-01-28: qty 1

## 2023-01-28 MED ORDER — ASPIRIN 81 MG PO TBEC
81.0000 mg | DELAYED_RELEASE_TABLET | Freq: Every day | ORAL | Status: DC
  Filled 2023-01-28: qty 1

## 2023-01-28 MED ORDER — ENOXAPARIN SODIUM 100 MG/ML IJ SOSY
100.0000 mg | PREFILLED_SYRINGE | INTRAMUSCULAR | Status: DC
  Administered 2023-01-30 – 2023-02-03 (×5): 100 mg via SUBCUTANEOUS
  Filled 2023-01-28 (×5): qty 1

## 2023-01-28 MED ORDER — ASPIRIN 81 MG PO CHEW
CHEWABLE_TABLET | ORAL | Status: AC
  Filled 2023-01-28: qty 1

## 2023-01-28 MED ORDER — DARBEPOETIN ALFA 60 MCG/0.3ML IJ SOSY: 60.0000 ug | PREFILLED_SYRINGE | INTRAMUSCULAR | Status: DC

## 2023-01-28 MED ORDER — HEPARIN SODIUM (PORCINE) 1000 UNIT/ML IJ SOLN
INTRAMUSCULAR | Status: AC
  Administered 2023-01-28: 1000 [IU]
  Filled 2023-01-28: qty 4

## 2023-01-28 NOTE — Plan of Care (Signed)
  Problem: Fluid Volume: Goal: Ability to maintain a balanced intake and output will improve Outcome: Progressing   Problem: Nutritional: Goal: Maintenance of adequate nutrition will improve Outcome: Progressing Goal: Progress toward achieving an optimal weight will improve Outcome: Progressing   Problem: Skin Integrity: Goal: Risk for impaired skin integrity will decrease Outcome: Progressing   

## 2023-01-28 NOTE — Progress Notes (Signed)
Nutrition Follow-up  DOCUMENTATION CODES:   Not applicable  INTERVENTION:  - Continue Nepro Shake po BID, each supplement provides 425 kcal and 19 grams protein  - Continue Magic cup TID with meals, each supplement provides 290 kcal and 9 grams of protein  NUTRITION DIAGNOSIS:   Inadequate oral intake related to inability to eat as evidenced by NPO status.  GOAL:   Patient will meet greater than or equal to 90% of their needs  MONITOR:   PO intake, Labs, I & O's, Supplement acceptance  REASON FOR ASSESSMENT:   Consult Enteral/tube feeding initiation and management  ASSESSMENT:   42 y.o. male presented to the ED with AMS and seizure like activity. PMH includes HTN, seizures, CVA, and ESRD on HD. Pt admitted with breakthrough seizures.  Meds reviewed: lipitor, renavit, renvela. Labs reviewed: Na low, BUN/Creatinine elevated, phos high, mag high.   Pt out of room in HD. Per record, pt has been eating 100% of his meals. Pt likely meeting his needs at this time. RD will continue to monitor PO intakes.   Diet Order:   Diet Order             Diet 2 gram sodium Room service appropriate? Yes with Assist; Fluid consistency: Thin; Fluid restriction: 1200 mL Fluid  Diet effective now                   EDUCATION NEEDS:   Not appropriate for education at this time  Skin:  Skin Assessment: Reviewed RN Assessment  Last BM:  8/8  Height:   Ht Readings from Last 1 Encounters:  01/19/23 6\' 1"  (1.854 m)    Weight:   Wt Readings from Last 1 Encounters:  01/28/23 104.3 kg    Ideal Body Weight:  83.6 kg  BMI:  Body mass index is 30.34 kg/m.  Estimated Nutritional Needs:   Kcal:  2200-2500 kcal/d  Protein:  110-130g/d  Fluid:  1L + UOP  Dion Parrow [place] Bing, RD, LDN, CNSC.

## 2023-01-28 NOTE — Procedures (Signed)
I was present at this dialysis session. I have reviewed the session itself and made appropriate changes.   Filed Weights   01/26/23 0331 01/27/23 0500 01/28/23 0827  Weight: 105.2 kg 105.5 kg 104.3 kg    Recent Labs  Lab 01/28/23 0143  NA 134*  K 4.7  CL 92*  CO2 21*  GLUCOSE 81  [name] 65*  CREATININE 14.61*  CALCIUM 9.0  PHOS 10.4*    Recent Labs  Lab 01/24/23 0059 01/25/23 0350 01/26/23 0423 01/27/23 0351 01/28/23 0143  WBC 7.6   < > 9.8 8.2 9.1  NEUTROABS 5.0  --  6.4 4.7  --   HGB 10.2*   < > 12.9* 12.4* 12.4*  HCT 31.3*   < > 39.3 38.2* 38.7*  MCV 94.0   < > 94.0 95.7 93.9  PLT 117*   < > 91* 91* 107*   < > = values in this interval not displayed.    Scheduled Meds:   stroke: early stages of recovery book   Does not apply Once   apixaban  5 mg Oral BID   aspirin EC  81 mg Oral Daily   atorvastatin  40 mg Oral Daily   carvedilol  3.125 mg Oral BID WC   Chlorhexidine Gluconate Cloth  6 each Topical Daily   Chlorhexidine Gluconate Cloth  6 each Topical Q0600   cinacalcet  60 mg Oral Q supper   [START ON 02/11/2023] darbepoetin (ARANESP) injection - DIALYSIS  60 mcg Subcutaneous Q14 Days   feeding supplement (NEPRO CARB STEADY)  237 mL Oral BID BM   heparin sodium (porcine)       levETIRAcetam  500 mg Oral BID   multivitamin  1 tablet Oral QHS   sevelamer carbonate  1,600 mg Oral TID WC   Continuous Infusions:  sodium chloride 10 mL/hr at 01/23/23 1400   PRN Meds:.acetaminophen, diphenhydrAMINE, docusate sodium, heparin sodium (porcine), LORazepam, ondansetron (ZOFRAN) IV, mouth rinse, polyethylene glycol   [name] Bun,  MD 01/28/2023, 10:10 AM

## 2023-01-28 NOTE — Progress Notes (Signed)
OT Cancellation Note  Patient Details Name: [name] [name] MRN: [id number] DOB: [birth year]   Cancelled Treatment:    Reason Eval/Treat Not Completed: Patient at procedure or test/ unavailable (HD)  Donia Pounds 01/28/2023, 9:30 AM
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
## 2023-01-28 NOTE — Progress Notes (Signed)
PT Cancellation Note  Patient Details Name: [name] [name] MRN: [id number] DOB: 09-28-1980   Cancelled Treatment:    Reason Eval/Treat Not Completed: Patient at procedure or test/unavailable. Pt in HD. Will continue to follow.   Angelina Ok [facility] 01/28/2023, 10:24 AM Skip Mayer PT Acute Rehabilitation Services Office [phone]

## 2023-01-28 NOTE — Progress Notes (Signed)
SLP Cancellation Note  Patient Details Name: [name] [name] MRN: [id number] DOB: [birth year]   Cancelled treatment:       Reason Eval/Treat Not Completed: Patient at procedure or test/unavailable. Pt in HD. Will initiate speech-language-cognitive eval when able.    Royce Macadamia 01/28/2023, 8:27 AM

## 2023-01-28 NOTE — Progress Notes (Signed)
Carotid arterial duplex completed. Please see CV Procedures for preliminary results.  Shona Simpson, RVT 01/28/23 3:26 PM

## 2023-01-28 NOTE — Progress Notes (Signed)
  [facility] Progress Note   Subjective:   Seen on HD.  Difficulty answering questions.  No complaints  Objective Vitals:   01/28/23 0827 01/28/23 0836 01/28/23 0842 01/28/23 0904  BP:  (!) 117/95 118/78 102/77  Pulse:  78 82 89  Resp:   16 17  Temp:  98.7 F (37.1 C)    TempSrc:      SpO2:  98% 98% 98%  Weight: 104.3 kg     Height:       Physical Exam General: Awake and alert but tired Heart: Normal rate, no rub Lungs: Bilateral chest with no increased work of breathing Abdomen: soft nondistended Extremities: trace edema, warm and well-perfused Dialysis Access: [facility] c/d/i    Dialysis Orders: [facility] MWF 4h   400/800 112.8kg  2/2 bath  TDC  Heparin none - Mircera 120 mcg IV q 2 weeks (last dose 01/16/2023) - Venofer 50 mg IV q week   Assessment/Plan: Seizures: Had not been taking OP meds. Found down at [employer].  Now improved Acute CVA - per MRI done 8/10 w/ cerebellar CVA w/o hemorrhage. Mgmt per neuro Acute hypoxic respiratory failure: pulm edema + possible aspiration PNA. Rx'd w/ HD and IV abx. Resolved.   Volume - under dry wt. Repeat CXR 8/10 showed resolution of edema. Looks like a good new dry wt at 106kg; will adjust if needed  BP - BP's are labile with some in the 90's. Stopped norvasc today; continue to decrease meds prn  ESRD -  MWF HD. Continue schedule  Anemia  - Hb dropped early this admit into 7's, but with volume removal and esa Rx, Hb has improved up to 10-12 here. Change aranesp to q2 weeks  Metabolic bone disease -  Corrected Ca+ 10 PO4 elevated. Started renvela as binder and resume sensipar 60mg  every day. CTM phos  Nutrition - push protein    01/28/2023, 10:07 AM  Recent Labs  Lab 01/26/23 0423 01/27/23 0351 01/28/23 0143  HGB 12.9* 12.4* 12.4*  ALBUMIN 3.8 3.7  --   CALCIUM 9.6 9.1 9.0  PHOS 6.6* 8.7* 10.4*  CREATININE 9.29* 12.72* 14.61*  K 4.2 4.1 4.7    Inpatient medications:   stroke: early stages of  recovery book   Does not apply Once   apixaban  5 mg Oral BID   aspirin EC  81 mg Oral Daily   atorvastatin  40 mg Oral Daily   carvedilol  3.125 mg Oral BID WC   Chlorhexidine Gluconate Cloth  6 each Topical Daily   Chlorhexidine Gluconate Cloth  6 each Topical Q0600   cinacalcet  60 mg Oral Q supper   darbepoetin (ARANESP) injection - DIALYSIS  60 mcg Subcutaneous Q Mon-1800   feeding supplement (NEPRO CARB STEADY)  237 mL Oral BID BM   heparin sodium (porcine)       levETIRAcetam  500 mg Oral BID   multivitamin  1 tablet Oral QHS   sevelamer carbonate  1,600 mg Oral TID WC    sodium chloride 10 mL/hr at 01/23/23 1400   acetaminophen, diphenhydrAMINE, docusate sodium, heparin sodium (porcine), LORazepam, ondansetron (ZOFRAN) IV, mouth rinse, polyethylene glycol

## 2023-01-28 NOTE — Progress Notes (Addendum)
PROGRESS NOTE    [name] [name]  ZOX:[id number] DOB: 03-14-1981 DOA: 01/18/2023 PCP: No primary care provider on file.  Chief Complaint  Patient presents with   Seizures    Brief Narrative:   42 year old with Lalana Wachter history of ESRD on HD MWF via R tunneled IJ dialysis catheter, seizure disorder, lupus nephritis, and HTN who was admitted to the ICU 01/18/2023 after he was found down [place] [employer] following Esker Dever seizure with subsequent altered mental status and Edelin Fryer suspected aspiration event. He required intubation at presentation and was admitted to the ICU.   Significant Events: 8/2 admitted after seizure with resultant hypoxic respiratory failure -intubation 8/3 HD initiated for volume overload -stopped due to hypotension 8/3 EEG noted diffuse slowing but no seizure or epileptogenicity 8/5 extubated -found to have right arm weakness/unable to lift right arm above shoulder 8/6 TRH assumed care - transferred to Med/Surg bed  8/10 MRI with acute stroke   Assessment & Plan:   Principal Problem:   Seizure [facility]) Active Problems:   Acute respiratory failure with hypoxia (HCC)  Vertigo  Acute Stroke 8/9 dizziness noted with walking, ataxia on L side noted on 8/10 MRI as below MRI brain with acute infarct of L PCA territory, multiple small foci of acute ischemia in the L cerebellar hemisphere, single focus of acute ischemia in the R cerebellum MRA head without contrast pending Carotid US pending Echo with EF 55-60%, no RWMA, severe LV hypertrophy, mildly reduced RVSF, mild to moderate MV regurgitation Telemetry LDL 105, A1c 5.2 Sed rate, TSH, RPR, CRP pending Statin, eliquis, appreciate neurology recommendations  PT, OT, SLP Will continue to monitor as he continues to mobilize.  Seizure No recurrent events since admission -continue Keppra -suspected poor compliance with home AEDs Of note, this chart marked for merged, no MRI in this chart, but MRI 07/2022 in chart to be merged  motion limited, but without acute abnormalities, extensive periventricular T2/FLAIR hyperintense signal abnormality which correlates with the regions of hypodensity seen on CT brain dated 04/29/2019. This is favored to represent changes related to Raymondo Garcialopez combination of chronic frontal parietal cortical infarcts superimposed on Tristy Udovich background of severe chronic microvascular ischemic.  Doesn't appear he's been to see an outpatient neurologist.    Per [facility] statutes, patients with seizures are not allowed to drive until  they have been seizure-free for six months. Use caution when using heavy equipment or power tools. Avoid working on ladders or at heights. Take showers instead of baths. Ensure the water temperature is not too high on the home water heater. Do not go swimming alone. When caring for infants or small children, sit down when holding, feeding, or changing them to minimize risk of injury to the child in the event you have Shaela Boer seizure. Also, Maintain good sleep hygiene. Avoid alcohol.  Aspiration pneumonitis -acute hypoxic respiratory failure Successfully extubated 8/5 -empiric Unasyn through 8/6 -no persisting clinical signs or symptoms of infection   Shock - septic versus sedation induced Required transient use of pressor support -resolved    ESRD on HD MWF Care as per Nephrology   Metabolic encephalopathy due to seizure activity and hypoxia Mental status slowly improving - cont to monitor w/ time  Delirium precautions   Right shoulder pain/weakness -cervical spondylosis/herniated disc with cervical stenosis/myelopathy Cervical MRI reveals herniated disc -right shoulder x-rays suggest degenerative change -Neurosurgery has evaluated and feels he has Josphine Laffey chronic cervical myelopathy -TRUE Garciamartinez C5-6 anterior cervicectomy fusion and plating has been recommended  but is not felt to be needed acutely -outpatient follow-up with Neurosurgery will be indicated   Anemia of chronic kidney  disease Hemoglobin stable - erythropoietin per Nephrology   SLE Antiphospholipid Syndrome  Not followed by Rheumatology Anticoagulation as above He needs to reestablish with rheum, last saw in 2022 that I saw   Chronic thrombocytopenia Has not kept scheduled appointments with Hematology Trend    HTN  BP on soft side, reduced dose of BP meds, appreciate renal   DM has been ruled out    Peer to peer for inpatient rehab unfortunately declined.    DVT prophylaxis: heparin Code Status: full Family Communication: none Disposition:   Status is: Inpatient Remains inpatient appropriate because: need for continued inpatient care   Consultants:  Neurosurgery PCCM  Procedures:  intubation  Antimicrobials:  Anti-infectives (From admission, onward)    Start     Dose/Rate Route Frequency Ordered Stop   01/20/23 1745  ampicillin-sulbactam (UNASYN) 1.5 g in sodium chloride 0.9 % 100 mL IVPB        1.5 g 200 mL/hr over 30 Minutes Intravenous Every 12 hours 01/20/23 1019 01/23/23 1814   01/19/23 0530  ampicillin-sulbactam (UNASYN) 1.5 g in sodium chloride 0.9 % 100 mL IVPB  Status:  Discontinued        1.5 g 200 mL/hr over 30 Minutes Intravenous Every 12 hours 01/19/23 0445 01/20/23 1019   01/18/23 2230  vancomycin (VANCOCIN) IVPB 1000 mg/200 mL premix  Status:  Discontinued        1,000 mg 200 mL/hr over 60 Minutes Intravenous  Once 01/18/23 2218 01/18/23 2222   01/18/23 2230  metroNIDAZOLE (FLAGYL) IVPB 500 mg        500 mg 100 mL/hr over 60 Minutes Intravenous  Once 01/18/23 2218 01/19/23 0047   01/18/23 2230  ceFEPIme (MAXIPIME) 2 g in sodium chloride 0.9 % 100 mL IVPB        2 g 200 mL/hr over 30 Minutes Intravenous  Once 01/18/23 2222 01/18/23 2323   01/18/23 2230  vancomycin (VANCOREADY) IVPB 2000 mg/400 mL        2,000 mg 200 mL/hr over 120 Minutes Intravenous  Once 01/18/23 2222 01/19/23 0415       Subjective: Continued vertigo with  movement  Objective: Vitals:   01/28/23 1030 01/28/23 1100 01/28/23 1130 01/28/23 1200  BP: 112/76 119/81 (!) 107/43 109/81  Pulse: 83 63 (!) 41 81  Resp: 16 13 (!) 30 15  Temp:      TempSrc:      SpO2: 98% 100% 100% 100%  Weight:      Height:        Intake/Output Summary (Last 24 hours) at 01/28/2023 1204 Last data filed at 01/27/2023 1800 Gross per 24 hour  Intake 200 ml  Output --  Net 200 ml    Filed Weights   01/26/23 0331 01/27/23 0500 01/28/23 0827  Weight: 105.2 kg 105.5 kg 104.3 kg    Examination:  General: No acute distress. Seen during dialysis Lungs: unlabored Neurological: Alert  Extremities: No clubbing or cyanosis. No edema.  Data Reviewed: I have personally reviewed following labs and imaging studies  CBC: Recent Labs  Lab 01/24/23 0059 01/25/23 0350 01/26/23 0423 01/27/23 0351 01/28/23 0143  WBC 7.6 7.4 9.8 8.2 9.1  NEUTROABS 5.0  --  6.4 4.7  --   HGB 10.2* 11.2* 12.9* 12.4* 12.4*  HCT 31.3* 33.6* 39.3 38.2* 38.7*  MCV 94.0 94.1 94.0 95.7 93.9  PLT  117* 106* 91* 91* 107*    Basic Metabolic Panel: Recent Labs  Lab 01/24/23 0059 01/25/23 0350 01/26/23 0423 01/27/23 0351 01/28/23 0143  NA 135 134* 133* 134* 134*  K 3.9 3.9 4.2 4.1 4.7  CL 94* 93* 93* 93* 92*  CO2 24 23 22 23  21*  GLUCOSE 85 91 80 94 81  BUN 47* 33* 32* 50* 65*  CREATININE 12.72* 10.38* 9.29* 12.72* 14.61*  CALCIUM 8.9 9.2 9.6 9.1 9.0  MG 2.3 2.1 2.1 2.3 2.5*  PHOS 6.5* 6.1* 6.6* 8.7* 10.4*    GFR: Estimated Creatinine Clearance: 8.3 mL/min (Cheney Ewart) (by C-G formula based on SCr of 14.61 mg/dL (H)).  Liver Function Tests: Recent Labs  Lab 01/22/23 0502 01/23/23 0433 01/24/23 0059 01/26/23 0423 01/27/23 0351  AST  --   --  31 29 22   ALT  --   --  20 25 24   ALKPHOS  --   --  78 84 87  BILITOT  --   --  0.9 0.6 0.6  PROT  --   --  7.8 8.9* 8.5*  ALBUMIN 3.0* 3.1* 3.3* 3.8 3.7    CBG: Recent Labs  Lab 01/22/23 2355 01/23/23 0318 01/23/23 0717  01/23/23 1118 01/23/23 1520  GLUCAP 110* 82 74 103* 94     Recent Results (from the past 240 hour(s))  Culture, blood (routine x 2)     Status: None   Collection Time: 01/18/23 10:26 PM   Specimen: BLOOD  Result Value Ref Range Status   Specimen Description BLOOD RIGHT ANTECUBITAL  Final   Special Requests   Final    BOTTLES DRAWN AEROBIC AND ANAEROBIC Blood Culture adequate volume   Culture   Final    NO GROWTH 5 DAYS Performed at [facility], [address] [place], [place] [address]    Report Status 01/23/2023 FINAL  Final  MRSA Next Gen by PCR, Nasal     Status: None   Collection Time: 01/19/23  1:07 AM   Specimen: Nasal Mucosa; Nasal Swab  Result Value Ref Range Status   MRSA by PCR Next Gen NOT DETECTED NOT DETECTED Final    Comment: (NOTE) The GeneXpert MRSA Assay (FDA approved for NASAL specimens only), is one component of [place] comprehensive MRSA colonization surveillance program. It is not intended to diagnose MRSA infection nor to guide or monitor treatment for MRSA infections. Test performance is not FDA approved in patients less than 74 years old. Performed at [facility], [address], [place], [place] [address]   Culture, blood (routine x 2)     Status: None   Collection Time: 01/19/23  2:13 AM   Specimen: BLOOD RIGHT HAND  Result Value Ref Range Status   Specimen Description BLOOD RIGHT HAND  Final   Special Requests   Final    BOTTLES DRAWN AEROBIC AND ANAEROBIC Blood Culture results may not be optimal due to an excessive volume of blood received in culture bottles   Culture   Final    NO GROWTH 5 DAYS Performed at [facility], [address], [place], [place] [address]    Report Status 01/24/2023 FINAL  Final         Radiology Studies: MR ANGIO HEAD WO CONTRAST  Result Date: 01/27/2023 CLINICAL DATA:  Stroke/TIA EXAM: MRA HEAD WITHOUT CONTRAST TECHNIQUE: Angiographic images of the Circle of Willis were acquired using MRA  technique without intravenous contrast. COMPARISON:  None Available. FINDINGS: POSTERIOR CIRCULATION: --  Vertebral arteries: Normal --Inferior cerebellar arteries: Normal. --Basilar artery: Normal. --Superior cerebellar arteries: Normal. --Posterior cerebral arteries: Normal. ANTERIOR CIRCULATION: --Intracranial internal carotid arteries: Normal. --Anterior cerebral arteries (ACA): Normal. --Middle cerebral arteries (MCA): Normal. IMPRESSION: Normal intracranial MRA. Electronically Signed   By: Deatra Robinson M.D.   On: 01/27/2023 20:25   MR BRAIN WO CONTRAST  Result Date: 01/26/2023 CLINICAL DATA:  Acute neurologic deficit EXAM: MRI HEAD WITHOUT CONTRAST TECHNIQUE: Multiplanar, multiecho pulse sequences of the brain and surrounding structures were obtained without intravenous contrast. COMPARISON:  08/10/2022 FINDINGS: Brain: Acute infarct of the left PCA territory multiple small foci of acute ischemia in the left cerebellar hemisphere. Single focus of acute ischemia in the right cerebellum. Punctate areas of hyperintensity on diffusion-weighted imaging along both internal capsule posterior limb there is have no ADC correlate. No acute or chronic hemorrhage. There is confluent hyperintense T2-weighted signal within the white matter. Generalized volume loss. Old left parietal infarct. Vascular: Major flow voids are preserved. Skull and upper cervical spine: Normal calvarium and skull base. Visualized upper cervical spine and soft tissues are normal. Sinuses/Orbits:No paranasal sinus fluid levels or advanced mucosal thickening. No mastoid or middle ear effusion. Normal orbits. IMPRESSION: 1. Acute infarct of the left PCA territory. No hemorrhage or mass effect. 2. Multiple small foci of acute ischemia in the left cerebellar hemisphere. Single focus of acute ischemia in the right cerebellum. Electronically Signed   By: Deatra Robinson M.D.   On: 01/26/2023 21:18        Scheduled Meds:   stroke: early stages of  recovery book   Does not apply Once   apixaban  5 mg Oral BID   aspirin EC  81 mg Oral Daily   atorvastatin  40 mg Oral Daily   carvedilol  3.125 mg Oral BID WC   Chlorhexidine Gluconate Cloth  6 each Topical Daily   Chlorhexidine Gluconate Cloth  6 each Topical Q0600   cinacalcet  60 mg Oral Q supper   [START ON 02/11/2023] darbepoetin (ARANESP) injection - DIALYSIS  60 mcg Subcutaneous Q14 Days   feeding supplement (NEPRO CARB STEADY)  237 mL Oral BID BM   heparin sodium (porcine)       levETIRAcetam  500 mg Oral BID   multivitamin  1 tablet Oral QHS   sevelamer carbonate  1,600 mg Oral TID WC   Continuous Infusions:  sodium chloride 10 mL/hr at 01/23/23 1400     LOS: 9 days    Time spent: over 30 min    Lacretia Nicks, MD [facility]   To contact the attending provider between 7A-7P or the covering provider during after hours 7P-7A, please log into the web site [email or web address] and access using universal [place] password for that web site. If you do not have the password, please call the hospital operator.  01/28/2023, 12:04 PM

## 2023-01-28 NOTE — Progress Notes (Addendum)
STROKE TEAM PROGRESS NOTE   BRIEF HPI [name] is a 42 y.o. male with history of HTN, seizures on keppra, CVA, ESRD, SLE with APLA who initially presented to [facility] ED for AMS and seizure like activity on 01/18/2023. He was subsequently intubated in the ICU on 8/2 and then extubated on 8/5. He was also found to have C5-6 herniated disc, spinal stenosis, and cervical myelopathy and was seen by neurosurgery who recommend a C5-C6 anterior cervicectomy fusion and plating. He will follow up with neurosurgery after discharge to plan. On 8/10 he had an MRI brain due to persistent lightheadedness. MRI showed a left PCA territory infarct with multiple small foci of acute ischemia.  Unclear compliance with Eliquis and Keppra, however he does acknowledge that he is supposed to be on both medications    SIGNIFICANT HOSPITAL EVENTS 8/2 admitted after seizure with resultant hypoxic respiratory failure -intubation 8/3 HD initiated for volume overload -stopped due to hypotension 8/3 EEG noted diffuse slowing but no seizure or epileptogenicity 8/5 extubated -found to have right arm weakness/unable to lift right arm above shoulder 8/6 TRH assumed care - transferred to Med/Surg bed  8/10 MRI with acute stroke   INTERIM HISTORY/SUBJECTIVE MRI brain with Acute infarct of the left PCA territory   Patient seen and examined in HD. He is awake and alert in NAD. He is confused unable to state correct month or current president.  Neurological exam with right hemianopia, no weakness noted  Attempted to call contacts listed in chart to enquire about medication compliance, however no answer at numbers listed  RPR pending  CRP 1.4 ESR 62 TSH 1.230  OBJECTIVE  CBC    Component Value Date/Time   WBC 9.1 01/28/2023 0143   RBC 4.12 (L) 01/28/2023 0143   HGB 12.4 (L) 01/28/2023 0143   HCT 38.7 (L) 01/28/2023 0143   PLT 107 (L) 01/28/2023 0143   MCV 93.9 01/28/2023 0143   MCH 30.1 01/28/2023 0143   MCHC 32.0  01/28/2023 0143   RDW 15.8 (H) 01/28/2023 0143   LYMPHSABS 1.8 01/27/2023 0351   MONOABS 1.3 (H) 01/27/2023 0351   EOSABS 0.2 01/27/2023 0351   BASOSABS 0.1 01/27/2023 0351    BMET    Component Value Date/Time   NA 134 (L) 01/28/2023 0143   K 4.7 01/28/2023 0143   CL 92 (L) 01/28/2023 0143   CO2 21 (L) 01/28/2023 0143   GLUCOSE 81 01/28/2023 0143   BUN 65 (H) 01/28/2023 0143   CREATININE 14.61 (H) 01/28/2023 0143   CALCIUM 9.0 01/28/2023 0143   GFRNONAA 4 (L) 01/28/2023 0143    IMAGING past 24 hours VAS US CAROTID  Result Date: 01/28/2023 Carotid Arterial Duplex Study Patient Name:  [name] [name]  Date of Exam:   01/28/2023 Medical Rec #: [id number]       Accession #:    [id number] Date of Birth: 02-19-1981      Patient Gender: M Patient Age:   57 years Exam Location:  [address] [facility] Procedure:      VAS US CAROTID Referring Phys: Brooke Dare --------------------------------------------------------------------------------  Indications:  CVA. Risk Factors: Hypertension, prior CVA. Limitations   Today's exam was limited due to patient movement. Performing Technologist: Shona Simpson  Examination Guidelines: A complete evaluation includes B-mode imaging, spectral Doppler, color Doppler, and power Doppler as needed of all accessible portions of each vessel. Bilateral testing is considered an integral part of a complete examination. Limited examinations for reoccurring indications may be  performed as noted.  Right Carotid Findings: +----------+--------+--------+--------+------------------+--------+           PSV cm/sEDV cm/sStenosisPlaque DescriptionComments +----------+--------+--------+--------+------------------+--------+ CCA Prox  62      12                                         +----------+--------+--------+--------+------------------+--------+ CCA Distal65      19                                          +----------+--------+--------+--------+------------------+--------+ ICA Prox  53      12      1-39%   heterogenous               +----------+--------+--------+--------+------------------+--------+ ICA Mid   76      18                                         +----------+--------+--------+--------+------------------+--------+ ICA Distal44      13                                         +----------+--------+--------+--------+------------------+--------+ ECA       63      14                                         +----------+--------+--------+--------+------------------+--------+ +----------+--------+-------+--------+-------------------+           PSV cm/sEDV cmsDescribeArm Pressure (mmHG) +----------+--------+-------+--------+-------------------+ [id number]      6                                  +----------+--------+-------+--------+-------------------+ +---------+--------+--+--------+--+ VertebralPSV cm/s42EDV cm/s12 +---------+--------+--+--------+--+  Left Carotid Findings: +----------+--------+--------+--------+------------------+--------+           PSV cm/sEDV cm/sStenosisPlaque DescriptionComments +----------+--------+--------+--------+------------------+--------+ CCA Prox  82      20                                         +----------+--------+--------+--------+------------------+--------+ CCA Distal76      19                                         +----------+--------+--------+--------+------------------+--------+ ICA Prox  41      17      1-39%   hyperechoic                +----------+--------+--------+--------+------------------+--------+ ICA Mid   92      37                                         +----------+--------+--------+--------+------------------+--------+ ICA Distal91      30                                         +----------+--------+--------+--------+------------------+--------+  ECA       79      19                                          +----------+--------+--------+--------+------------------+--------+ +----------+--------+--------+--------+-------------------+           PSV cm/sEDV cm/sDescribeArm Pressure (mmHG) +----------+--------+--------+--------+-------------------+ Subclavian93      11                                  +----------+--------+--------+--------+-------------------+ +---------+--------+--+--------+--+ VertebralPSV cm/s42EDV cm/s12 +---------+--------+--+--------+--+   Summary: Right Carotid: Velocities in the right ICA are consistent with a 1-39% stenosis. Left Carotid: Velocities in the left ICA are consistent with a 1-39% stenosis. Vertebrals:  Bilateral vertebral arteries demonstrate antegrade flow. Subclavians: Normal flow hemodynamics were seen in bilateral subclavian              arteries. *See table(s) above for measurements and observations.     Preliminary    MR ANGIO HEAD WO CONTRAST  Result Date: 01/27/2023 CLINICAL DATA:  Stroke/TIA EXAM: MRA HEAD WITHOUT CONTRAST TECHNIQUE: Angiographic images of the Circle of Willis were acquired using MRA technique without intravenous contrast. COMPARISON:  None Available. FINDINGS: POSTERIOR CIRCULATION: --Vertebral arteries: Normal --Inferior cerebellar arteries: Normal. --Basilar artery: Normal. --Superior cerebellar arteries: Normal. --Posterior cerebral arteries: Normal. ANTERIOR CIRCULATION: --Intracranial internal carotid arteries: Normal. --Anterior cerebral arteries (ACA): Normal. --Middle cerebral arteries (MCA): Normal. IMPRESSION: Normal intracranial MRA. Electronically Signed   By: Deatra Robinson M.D.   On: 01/27/2023 20:25    Vitals:   01/28/23 1255 01/28/23 1256 01/28/23 1329 01/28/23 1604  BP:  105/79 112/79 (!) 123/104  Pulse:   81 96  Resp:   19 18  Temp:   (!) 97.5 F (36.4 C) 98 F (36.7 C)  TempSrc:   Oral Oral  SpO2:   100% 100%  Weight: 104.1 kg     Height:         PHYSICAL  EXAM General:  Alert, well-nourished, well-developed middle-aged African-American male t in no acute distress Psych:  Mood and affect appropriate for situation CV: Regular rate and rhythm on monitor Respiratory:  Regular, unlabored respirations on room air GI: Abdomen soft and nontender   NEURO:  Mental Status: AA&O to self and place. Unable to state correct month or current president, Speech/Language: speech is without dysarthria or aphasia.  Unable to give accurate history  Cranial Nerves:  II: PERRL. Visual fields show dense right homonymous hemianopia  III, IV, VI: EOMI. Eyelids elevate symmetrically.  V: Sensation is intact to light touch and symmetrical to face.  VII: Face is symmetrical resting and smiling VIII: hearing intact to voice. IX, X: Palate elevates symmetrically. Phonation is normal.  DG:[id number] shrug 5/5. XII: tongue is midline without fasciculations. Motor: moves all 4 extremities antigravity Tone: is normal and bulk is normal Sensation- Intact to light touch bilaterally. Extinction absent to light touch to DSS.   Coordination: FTN intact bilaterally, HKS: no ataxia in BLE.No drift.  Gait- deferred   ASSESSMENT/PLAN  Acute Ischemic Infarct:  left PCA    Etiology:  likely cardioembolic vs hypercoagulable state from SLE and antiphospholipid antibody : CT head No acute abnormality. Small vessel disease. Atrophy.    MRI  Acute infarct of the left PCA territory  MRA  negative Carotid Doppler bilateral 1-39% carotid  stenosis 2D Echo EF 55-60%. severe left ventricular hypertrophy. Left atrial size was mildly dilated  LDL 105 HgbA1c 5.2 VTE prophylaxis - on Eliquis  Eliquis (apixaban) daily (? Compliance) prior to admission, now on Eliquis (apixaban) daily  Therapy recommendations:  pending Disposition:  pending  Hx of CVA seizures  On keppra continued in hospital  Hx of SLE with APLA On eliquis unclear if was compliant  Lost to follow with rheumatology  last visit 2022  Hypertension hypotension Home meds:  norvasc 10mg , coreg 6.25 mg  UnStable Blood Pressure Goal: SBP less than 160   Hyperlipidemia Home meds:  none,  LDL 105, goal < 70 On lipitor 40mg   Continue statin at discharge  Dysphagia Patient has post-stroke dysphagia, SLP consulted    Diet   Diet 2 gram sodium Room service appropriate? Yes with Assist; Fluid consistency: Thin; Fluid restriction: 1200 mL Fluid   Advance diet as tolerated  Other Stroke Risk Factors Obesity, Body mass index is 30.28 kg/m., BMI >/= 30 associated with increased stroke risk, recommend weight loss, diet and exercise as appropriate    Other Active Problems ESRD on HD Chronic anemia   Hospital day # 9  Gevena Mart DNP, ACNPC-AG  [facility]  STROKE MD NOTE :  I have personally obtained history,examined this patient, reviewed notes, independently viewed imaging studies, participated in medical decision making and plan of care.ROS completed by me personally and pertinent positives fully documented  I have made any additions or clarifications directly to the above note. Agree with note above.  Patient with known history of SLE and lupus anticoagulant on long-term anticoagulation with Eliquis but unclear compliance with it as it was last filled in December 2023 as per pharmacy records.  He was admitted with altered mental status and seizure-like activity 10 days ago and MRI done 2 days ago for persistent lightheadedness showed acute left PCA territory infarct.  Etiology likely related to hypercoagulable disorder versus cardiogenic embolism.  Recommend consider switching Eliquis to Lovenox injections which can be administered by caregiver or family to ensure compliance and may be superior to Eliquis for anticardiolipin antibody syndrome.  Patient advised not to drive till peripheral vision loss improves.  Continue ongoing stroke workup..  Aggressive risk factor modification.  Long  discussion with patient and answered questions.  Discussed with Dr. Lowell Guitar.  Greater than 50% time during this 50-minute visit was spent counseling and coordination of care about his stroke and peripheral vision loss and answering questions  Delia Heady, MD Medical Director [facility] Pager: [phone] 01/28/2023 4:52 PM   To contact Stroke Continuity provider, please refer to [email or web address]. After hours, contact General Neurology

## 2023-01-28 NOTE — Plan of Care (Signed)
  Problem: Coping: Goal: Ability to adjust to condition or change in health will improve Outcome: Progressing   Problem: Metabolic: Goal: Ability to maintain appropriate glucose levels will improve Outcome: Progressing   Problem: Clinical Measurements: Goal: Ability to maintain clinical measurements within normal limits will improve Outcome: Progressing Goal: Will remain free from infection Outcome: Progressing   Problem: Nutrition: Goal: Adequate nutrition will be maintained Outcome: Progressing   Problem: Coping: Goal: Level of anxiety will decrease Outcome: Progressing   Problem: Elimination: Goal: Will not experience complications related to bowel motility Outcome: Progressing Goal: Will not experience complications related to urinary retention Outcome: Progressing   Problem: Pain Managment: Goal: General experience of comfort will improve Outcome: Progressing   Problem: Safety: Goal: Ability to remain free from injury will improve Outcome: Progressing   Problem: Skin Integrity: Goal: Risk for impaired skin integrity will decrease Outcome: Progressing

## 2023-01-28 NOTE — Progress Notes (Signed)
ANTICOAGULATION CONSULT NOTE - Follow Up Consult  Pharmacy Consult for Apixaban > Enoxaparin Indication:  antiphospholipid syndrome  Allergies  Allergen Reactions   Cetirizine & Related Swelling    Patient Measurements: Height: 6\' 1"  (185.4 cm) Weight: 104.1 kg (229 lb 8 oz) IBW/kg (Calculated) : 79.9 Enoxaparin Dosing Weight: 104 kg   Vital Signs: Temp: 98 F (36.7 C) (08/12 1604) Temp Source: Oral (08/12 1604) BP: 123/104 (08/12 1604) Pulse Rate: 96 (08/12 1604)  Labs: Recent Labs    01/26/23 0423 01/27/23 0351 01/28/23 0143  HGB 12.9* 12.4* 12.4*  HCT 39.3 38.2* 38.7*  PLT 91* 91* 107*  CREATININE 9.29* 12.72* 14.61*   ESRD  Assessment: 41yoM with hx of lupus anticoagulant/antiphospholipid syndrome was supposed to be on apixaban prior to admission, but last picked up Dec 2023 for 90 day supply. Pharmacy consulted to restart apixaban on 01/27/23. Chronic thrombocytopenia, CBC stable. Had been on subQ heparin for VTE prophylaxis with last dose ~6am on 8/11.   Apixaban 5 mg given x 2 on 8/11 and ~2pm on 01/28/23. Pharmacy consulted to transition to full-dose Enoxaparin.  ESRD with usual MWF HD.  Discussed with Drs. Lowell Guitar and Pearlean Brownie, who are aware of increased risk of bleeding with Enoxaparin and ESRD due to potential accumulation of active heparin metabolites that are undetected by anti-Xa levels. They would like to proceed with Enoxaparin for now.  Would usually begin Enoxaparin ~12 hours after Apixaban dose, but will defer for a few hours to avoid 2am dosing.   Goal of Therapy:  Anti-Xa level 0.6-1 units/ml 4hrs after LMWH dose given Monitor platelets by anticoagulation protocol: Yes   Plan:  Enoxaparin 100 mg (~1 mg/kg) SQ x 1 at 4am on 8/13 Then Enoxaparin SQ q24h at 6am to begin on 8/14. Will plan to check anti-Xa level after 3-4 doses. CBC at least q72hrs. Will try to time with MWF with HD labs. Monitor for signs/symptoms of bleeding.  Dennie Fetters,  [place] 01/28/2023,6:16 PM

## 2023-01-28 NOTE — Progress Notes (Signed)
   01/28/23 1243  Vitals  Temp 98.1 F (36.7 C)  Pulse Rate 92  Resp (!) 26  BP 114/89  SpO2 97 %  O2 Device Room Air  During Treatment Monitoring  Blood Flow Rate (mL/min) 299 mL/min  Arterial Pressure (mmHg) -194.74 mmHg  Venous Pressure (mmHg) 165.45 mmHg  TMP (mmHg) 2.02 mmHg  Ultrafiltration Rate (mL/min) 374 mL/min  Dialysate Flow Rate (mL/min) 49 ml/min  HD Safety Checks Performed Yes  Intra-Hemodialysis Comments Tx completed;Tolerated well   Received patient in bed to unit.  Alert and oriented.  Informed consent signed and in chart.   TX duration:3.5  Patient tolerated well.  Transported back to the room  Alert, without acute distress.  Hand-off given to patient's nurse.   Access used: Yes Access issues: Yes, Pt clotted off machine D/T pt did not get heparin  Total UF removed: 0 Medication(s) given: See MAR Post HD VS: See Above Grid Post HD weight: 104.1 kg   [facility]

## 2023-01-29 DIAGNOSIS — R569 Unspecified convulsions: Secondary | ICD-10-CM | POA: Diagnosis not present

## 2023-01-29 LAB — PROTIME-INR
INR: 1.1 (ref 0.8–1.2)
Prothrombin Time: 14.9 seconds (ref 11.4–15.2)

## 2023-01-29 MED ORDER — WARFARIN SODIUM 7.5 MG PO TABS
7.5000 mg | ORAL_TABLET | ORAL | Status: AC
  Administered 2023-01-29: 7.5 mg via ORAL
  Filled 2023-01-29: qty 1

## 2023-01-29 MED ORDER — WARFARIN - PHARMACIST DOSING INPATIENT: Freq: Every day | Status: DC

## 2023-01-29 NOTE — NC FL2 (Signed)
[place] MEDICAID FL2 LEVEL OF CARE FORM     IDENTIFICATION  Patient Name: [name] [name] Birthdate: 1981/06/10 Sex: male Admission Date (Current Location): 01/18/2023  [place] and [place] Number:  Haynes Bast [id number] Facility and Address:  [facility], [address], [place], [place] [address]      Provider Number: [id number]  Attending Physician Name and Address:  Zigmund Daniel., *  Relative Name and Phone Number:       Current Level of Care: Hospital Recommended Level of Care: Skilled Nursing Facility Prior Approval Number:    Date Approved/Denied:   PASRR Number: [id number]  Discharge Plan: SNF    Current Diagnoses: Patient Active Problem List   Diagnosis Date Noted   Seizure (HCC) 01/19/2023   Acute respiratory failure with hypoxia (HCC) 01/19/2023    Orientation RESPIRATION BLADDER Height & Weight     Self, Time, Situation, Place  Normal Continent Weight: 104.2 kg Height:  6\' 1"  (185.4 cm)  BEHAVIORAL SYMPTOMS/MOOD NEUROLOGICAL BOWEL NUTRITION STATUS    Convulsions/Seizures (Last seizure documented on 01/18/23) Continent Diet (See discharge summary)  AMBULATORY STATUS COMMUNICATION OF NEEDS Skin   Limited Assist Verbally Normal                       Personal Care Assistance Level of Assistance  Bathing, Feeding, Dressing, Total care Bathing Assistance: Limited assistance Feeding assistance: Independent Dressing Assistance: Limited assistance     Functional Limitations Info  Sight, Hearing, Speech Sight Info: Adequate Hearing Info: Adequate Speech Info: Adequate    SPECIAL CARE FACTORS FREQUENCY  PT (By licensed PT), OT (By licensed OT)     PT Frequency: 5 x per week OT Frequency: 5 x per week            Contractures Contractures Info: Not present    Additional Factors Info  Code Status, Allergies Code Status Info: Full code Allergies Info: Cetirizine           Current Medications  (01/29/2023):  This is the current hospital active medication list Current Facility-Administered Medications  Medication Dose Route Frequency Provider Last Rate Last Admin   0.9 %  sodium chloride infusion  250 mL Intravenous Continuous Tim Lair, PA-C 10 mL/hr at 01/23/23 1400 Infusion Verify at 01/23/23 1400   acetaminophen (TYLENOL) tablet 650 mg  650 mg Oral Q6H PRN Knute Neu, RPH   650 mg at 01/27/23 0050   atorvastatin (LIPITOR) tablet 40 mg  40 mg Oral Daily Zigmund Daniel., MD   40 mg at 01/29/23 0846   carvedilol (COREG) tablet 3.125 mg  3.125 mg Oral BID WC Delano Metz, MD   3.125 mg at 01/29/23 0846   Chlorhexidine Gluconate Cloth 2 % PADS 6 each  6 each Topical Q0600 Delano Metz, MD   6 each at 01/29/23 0559   cinacalcet (SENSIPAR) tablet 60 mg  60 mg Oral Q supper Delano Metz, MD   60 mg at 01/28/23 1654   [START ON 02/11/2023] Darbepoetin Alfa (ARANESP) injection 60 mcg  60 mcg Subcutaneous Q14 Days Darnell Level, MD       diphenhydrAMINE (BENADRYL) capsule 25 mg  25 mg Oral Q6H PRN Lonia Blood, MD   25 mg at 01/27/23 0050   docusate sodium (COLACE) capsule 100 mg  100 mg Oral BID PRN Tim Lair, PA-C       [START ON 01/30/2023] enoxaparin (LOVENOX) injection 100 mg  100 mg Subcutaneous Q24H Zigmund Daniel., MD       feeding supplement (NEPRO CARB STEADY) liquid 237 mL  237 mL Oral BID BM Zigmund Daniel., MD   237 mL at 01/29/23 1456   levETIRAcetam (KEPPRA) tablet 500 mg  500 mg Oral BID Marrianne Mood, MD   500 mg at 01/29/23 0846   LORazepam (ATIVAN) injection 4 mg  4 mg Intravenous Q5 Min x 2 PRN Marrianne Mood, MD       multivitamin (RENA-VIT) tablet 1 tablet  1 tablet Oral QHS Knute Neu, RPH   1 tablet at 01/28/23 2138   ondansetron [facility]) injection 4 mg  4 mg Intravenous Q6H PRN Howerter, Justin B, DO   4 mg at 01/26/23 2359   Oral care mouth rinse  15 mL Mouth Rinse PRN Kalman Shan, MD        polyethylene glycol (MIRALAX / GLYCOLAX) packet 17 g  17 g Oral Daily PRN Cloyd Stagers M, PA-C       sevelamer carbonate (RENVELA) tablet 1,600 mg  1,600 mg Oral TID WC Delano Metz, MD   1,600 mg at 01/29/23 1231     Discharge Medications: Please see discharge summary for a list of discharge medications.  Relevant Imaging Results:  Relevant Lab Results:   Additional Information SS# [id number]  Janae Bridgeman, RN

## 2023-01-29 NOTE — Progress Notes (Signed)
STROKE TEAM PROGRESS NOTE   BRIEF HPI [name] [name] is a 42 y.o. male with history of HTN, seizures on keppra, CVA, ESRD, SLE with APLA who initially presented to [facility] ED for AMS and seizure like activity on 01/18/2023. He was subsequently intubated in the ICU on 8/2 and then extubated on 8/5. He was also found to have C5-6 herniated disc, spinal stenosis, and cervical myelopathy and was seen by neurosurgery who recommend a C5-C6 anterior cervicectomy fusion and plating. He will follow up with neurosurgery after discharge to plan. On 8/10 he had an MRI brain due to persistent lightheadedness. MRI showed a left PCA territory infarct with multiple small foci of acute ischemia.  Unclear compliance with Eliquis and Keppra, however he does acknowledge that he is supposed to be on both medications    SIGNIFICANT HOSPITAL EVENTS 8/2 admitted after seizure with resultant hypoxic respiratory failure -intubation 8/3 HD initiated for volume overload -stopped due to hypotension 8/3 EEG noted diffuse slowing but no seizure or epileptogenicity 8/5 extubated -found to have right arm weakness/unable to lift right arm above shoulder 8/6 TRH assumed care - transferred to Med/Surg bed  8/10 MRI with acute stroke   INTERIM HISTORY/SUBJECTIVE Patient is ambulating in the hallway on the floor.  He has no complaints.  OBJECTIVE  CBC    Component Value Date/Time   WBC 9.1 01/28/2023 0143   RBC 4.12 (L) 01/28/2023 0143   HGB 12.4 (L) 01/28/2023 0143   HCT 38.7 (L) 01/28/2023 0143   PLT 107 (L) 01/28/2023 0143   MCV 93.9 01/28/2023 0143   MCH 30.1 01/28/2023 0143   MCHC 32.0 01/28/2023 0143   RDW 15.8 (H) 01/28/2023 0143   LYMPHSABS 1.8 01/27/2023 0351   MONOABS 1.3 (H) 01/27/2023 0351   EOSABS 0.2 01/27/2023 0351   BASOSABS 0.1 01/27/2023 0351    BMET    Component Value Date/Time   NA 134 (L) 01/28/2023 0143   K 4.7 01/28/2023 0143   CL 92 (L) 01/28/2023 0143   CO2 21 (L) 01/28/2023 0143    GLUCOSE 81 01/28/2023 0143   BUN 65 (H) 01/28/2023 0143   CREATININE 14.61 (H) 01/28/2023 0143   CALCIUM 9.0 01/28/2023 0143   GFRNONAA 4 (L) 01/28/2023 0143    IMAGING past 24 hours No results found.  Vitals:   01/29/23 0500 01/29/23 0756 01/29/23 1239 01/29/23 1622  BP:  107/69 119/89 103/76  Pulse:  73 76 82  Resp:  19 18 19   Temp:  (!) 97.5 F (36.4 C) (!) 97.5 F (36.4 C) 97.6 F (36.4 C)  TempSrc:      SpO2:  100% 100% 98%  Weight: 104.2 kg     Height:         PHYSICAL EXAM General:  Alert, well-nourished, well-developed middle-aged African-American male t in no acute distress Psych:  Mood and affect appropriate for situation CV: Regular rate and rhythm on monitor Respiratory:  Regular, unlabored respirations on room air GI: Abdomen soft and nontender   NEURO:  Mental Status: AA&O to self and place. Unable to state correct month or current president, Speech/Language: speech is without dysarthria or aphasia.  Unable to give accurate history  Cranial Nerves:  II: PERRL. Visual fields show dense right homonymous hemianopia  III, IV, VI: EOMI. Eyelids elevate symmetrically.  V: Sensation is intact to light touch and symmetrical to face.  VII: Face is symmetrical resting and smiling VIII: hearing intact to voice. IX, X: Palate elevates symmetrically.  Phonation is normal.  OZ:HYQMVHQI shrug 5/5. XII: tongue is midline without fasciculations. Motor: moves all 4 extremities antigravity Tone: is normal and bulk is normal Sensation- Intact to light touch bilaterally. Extinction absent to light touch to DSS.   Coordination: FTN intact bilaterally, HKS: no ataxia in BLE.No drift.  Gait- deferred   ASSESSMENT/PLAN  Acute Ischemic Infarct:  left PCA    Etiology:  likely cardioembolic vs hypercoagulable state from SLE and antiphospholipid antibody : CT head No acute abnormality. Small vessel disease. Atrophy.    MRI  Acute infarct of the left PCA territory  MRA   negative Carotid Doppler bilateral 1-39% carotid stenosis 2D Echo EF 55-60%. severe left ventricular hypertrophy. Left atrial size was mildly dilated  LDL 105 HgbA1c 5.2 VTE prophylaxis - on Eliquis  Eliquis (apixaban) daily (? Compliance) prior to admission, now on Eliquis (apixaban) daily  Therapy recommendations:  SNF Disposition:  pending  Hx of CVA seizures  On keppra continued in hospital  Hx of SLE with APLA On eliquis unclear if was compliant  Lost to follow with rheumatology last visit 2022  Hypertension hypotension Home meds:  norvasc 10mg , coreg 6.25 mg  UnStable Blood Pressure Goal: SBP less than 160   Hyperlipidemia Home meds:  none,  LDL 105, goal < 70 On lipitor 40mg   Continue statin at discharge  Dysphagia Patient has post-stroke dysphagia, SLP consulted    Diet   Diet 2 gram sodium Room service appropriate? Yes with Assist; Fluid consistency: Thin; Fluid restriction: 1200 mL Fluid   Advance diet as tolerated  Other Stroke Risk Factors Obesity, Body mass index is 30.31 kg/m., BMI >/= 30 associated with increased stroke risk, recommend weight loss, diet and exercise as appropriate    Other Active Problems ESRD on HD Chronic anemia   Hospital day # 10   Patient with known history of SLE and lupus anticoagulant on long-term anticoagulation with Eliquis but unclear compliance with it as it was last filled in December 2023 as per pharmacy records.  He was admitted with altered mental status and seizure-like activity 10 days ago and MRI done 2 days ago for persistent lightheadedness showed acute left PCA territory infarct.  Etiology likely related to hypercoagulable disorder versus cardiogenic embolism.   Doubt patient is going to be extremely compliant with taking either Eliquis or Lovenox shots hence recommend continue warfarin but bridged with Lovenox till INR is between 2 and 3..  Aggressive risk factor modification.  Long discussion with patient and  answered questions.  Discussed with Dr. Lowell Guitar.  Greater than 50% time during this 35-minute visit was spent counseling and coordination of care about his stroke and peripheral vision loss and answering questions.  Stroke team will sign off.  Kindly call for questions  Delia Heady, MD Medical Director [facility] Pager: [phone] 01/29/2023 5:04 PM   To contact Stroke Continuity provider, please refer to [email or web address]. After hours, contact General Neurology

## 2023-01-29 NOTE — Progress Notes (Signed)
  [facility] Progress Note   Subjective:   No complaints today other than admitting to some confusion. No dialysis issues yesterday.  Objective Vitals:   01/28/23 2000 01/29/23 0437 01/29/23 0500 01/29/23 0756  BP: 105/85 90/67  107/69  Pulse: 84 72  73  Resp: 19 15  19   Temp: (!) 97.3 F (36.3 C) 97.6 F (36.4 C)  (!) 97.5 F (36.4 C)  TempSrc:      SpO2:  100%  100%  Weight:   104.2 kg   Height:       Physical Exam General: Awake and alert, sitting on bed Heart: Normal rate, no rub Lungs: Bilateral chest with no increased work of breathing Abdomen: soft nondistended Extremities: trace edema, warm and well-perfused Dialysis Access: [facility] c/d/i    Dialysis Orders: [facility] MWF 4h   400/800 112.8kg  2/2 bath  TDC  Heparin none - Mircera 120 mcg IV q 2 weeks (last dose 01/16/2023) - Venofer 50 mg IV q week   Assessment/Plan: Seizures: Had not been taking OP meds. Found down at [employer].  Now improved Acute CVA - per MRI done 8/10 w/ cerebellar CVA w/o hemorrhage. Mgmt per neuro Acute hypoxic respiratory failure: pulm edema + possible aspiration PNA. Rx'd w/ HD and IV abx. Resolved.   Volume - under dry wt. Repeat CXR 8/10 showed resolution of edema. Looks like a good new dry wt at 106kg; will adjust if needed  BP - BP's are labile with some in the 90's. Stopped norvasc; continue to decrease meds prn  ESRD -  MWF HD. Continue schedule  Anemia  - Hb dropped early this admit into 7's, but with volume removal and esa Rx, Hb has improved up to 10-12 here. Changed aranesp to q2 weeks  Metabolic bone disease -  Corrected Ca+ 10 PO4 elevated. Started renvela as binder and resume sensipar 60mg  every day. CTM phos  Nutrition - push protein    01/29/2023, 10:19 AM  Recent Labs  Lab 01/26/23 0423 01/27/23 0351 01/28/23 0143  HGB 12.9* 12.4* 12.4*  ALBUMIN 3.8 3.7  --   CALCIUM 9.6 9.1 9.0  PHOS 6.6* 8.7* 10.4*  CREATININE 9.29* 12.72* 14.61*  K 4.2  4.1 4.7    Inpatient medications:  atorvastatin  40 mg Oral Daily   carvedilol  3.125 mg Oral BID WC   Chlorhexidine Gluconate Cloth  6 each Topical Q0600   cinacalcet  60 mg Oral Q supper   [START ON 02/11/2023] darbepoetin (ARANESP) injection - DIALYSIS  60 mcg Subcutaneous Q14 Days   [START ON 01/30/2023] enoxaparin (LOVENOX) injection  100 mg Subcutaneous Q24H   feeding supplement (NEPRO CARB STEADY)  237 mL Oral BID BM   levETIRAcetam  500 mg Oral BID   multivitamin  1 tablet Oral QHS   sevelamer carbonate  1,600 mg Oral TID WC    sodium chloride 10 mL/hr at 01/23/23 1400   acetaminophen, diphenhydrAMINE, docusate sodium, LORazepam, ondansetron (ZOFRAN) IV, mouth rinse, polyethylene glycol
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
## 2023-01-29 NOTE — Progress Notes (Signed)
ANTICOAGULATION CONSULT NOTE - Follow Up Consult  Pharmacy Consult for Enoxaparin and Warfarin Indication:  antiphospholipid syndrome  Allergies  Allergen Reactions   Cetirizine & Related Swelling    Patient Measurements: Height: 6\' 1"  (185.4 cm) Weight: 104.2 kg (229 lb 11.5 oz) IBW/kg (Calculated) : 79.9 Enoxaparin Dosing Weight: 104 kg   Vital Signs: Temp: 97.5 F (36.4 C) (08/13 1239) BP: 119/89 (08/13 1239) Pulse Rate: 76 (08/13 1239)  Labs: Recent Labs    01/27/23 0351 01/28/23 0143  HGB 12.4* 12.4*  HCT 38.2* 38.7*  PLT 91* 107*  CREATININE 12.72* 14.61*   Assessment: 41yoM with hx of lupus anticoagulant/antiphospholipid syndrome was supposed to be on apixaban prior to admission, but last picked up Dec 2023 for 90 day supply. Pharmacy consulted to restart apixaban on 01/27/23. Chronic thrombocytopenia, CBC stable. Had been on subQ heparin for VTE prophylaxis with last dose ~6am on 8/11.   Apixaban 5 mg given x 2 on 8/11 and ~2pm on 01/28/23. Pharmacy consulted to transition to full-dose Enoxaparin.  ESRD with usual MWF HD.  Discussed with Drs. Lowell Guitar and Pearlean Brownie, who are aware of increased risk of bleeding with Enoxaparin and ESRD due to potential accumulation of active heparin metabolites that are undetected by anti-Xa levels. They would like to proceed with Enoxaparin for now.    Decision made to change longterm AC to warfarin. INR today is 1.1. Not currently on any interacting medications.  Goal of Therapy:  INR 2-3 Anti-Xa level 0.6-1 units/ml 4hrs after LMWH dose given Monitor platelets by anticoagulation protocol: Yes   Plan:  Daily INR Warfarin 7.5mg  po now Enoxaparin 100 mg SQ q24h  Will plan to check anti-Xa level after 3-4 doses. CBC at least q72hrs. Will try to time with MWF with HD labs. Monitor for signs/symptoms of bleeding.  Christoper Fabian, PharmD, BCPS Please see amion for complete clinical pharmacist phone list 01/29/2023,3:21 PM

## 2023-01-29 NOTE — Progress Notes (Addendum)
PROGRESS NOTE    [name] [name]  GNF:[id number] DOB: [birth year] DOA: 01/18/2023 PCP: No primary care provider on file.  Chief Complaint  Patient presents with   Seizures    Brief Narrative:   42 year old with [name] [name] history of ESRD on HD MWF via R tunneled IJ dialysis catheter, seizure disorder, lupus nephritis, and HTN who was admitted to the ICU 01/18/2023 after he was found down at [name] following [place] seizure with subsequent altered mental status and [name] [name] suspected aspiration event. He required intubation at presentation and was admitted to the ICU.   Significant Events: 8/2 admitted after seizure with resultant hypoxic respiratory failure -intubation 8/3 HD initiated for volume overload -stopped due to hypotension 8/3 EEG noted diffuse slowing but no seizure or epileptogenicity 8/5 extubated -found to have right arm weakness/unable to lift right arm above shoulder 8/6 TRH assumed care - transferred to Med/Surg bed  8/10 MRI with acute stroke  8/13 plan for SNF   Assessment & Plan:   Principal Problem:   Seizure [facility]) Active Problems:   Acute respiratory failure with hypoxia (HCC)  Vertigo  Acute Stroke 8/9 dizziness noted with walking, ataxia on L side noted on 8/10 MRI as below MRI brain with acute infarct of L PCA territory, multiple small foci of acute ischemia in the L cerebellar hemisphere, single focus of acute ischemia in the R cerebellum MRA head without contrast pending Carotid US 1-39% stenosis bilaterally Echo with EF 55-60%, no RWMA, severe LV hypertrophy, mildly reduced RVSF, mild to moderate MV regurgitation Telemetry LDL 105, A1c 5.2 Sed rate 62, TSH wnl, RPR wnl, CRP 1.4  Statin, warfarin with lovenox bridge (had considered long term lovenox, but per discussion with pharmacy, long term lovenox not ideal in ESRD.  although it seems like he's failed warfarin in the past, he'll discharge from this hospitalization to SNF and after that will hopefully be  able to discharge from SNF home with his mother - hopefully more success in this environment). appreciate neurology recommendations  PT, OT, SLP Will continue to monitor as he continues to mobilize.  Seizure No recurrent events since admission -continue Keppra -suspected poor compliance with home AEDs Of note, this chart marked for merged, no MRI in this chart, but MRI 07/2022 in chart to be merged motion limited, but without acute abnormalities, extensive periventricular T2/FLAIR hyperintense signal abnormality which correlates with the regions of hypodensity seen on CT brain dated 04/29/2019. This is favored to represent changes related to Kabao Leite combination of chronic frontal parietal cortical infarcts superimposed on [name] background of severe chronic microvascular ischemic.  Doesn't appear he's been to see an outpatient neurologist.    Per [facility] statutes, patients with seizures are not allowed to drive until  they have been seizure-free for six months. Use caution when using heavy equipment or power tools. Avoid working on ladders or at heights. Take showers instead of baths. Ensure the water temperature is not too high on the home water heater. Do not go swimming alone. When caring for infants or small children, sit down when holding, feeding, or changing them to minimize risk of injury to the child in the event you have Elin Fenley seizure. Also, Maintain good sleep hygiene. Avoid alcohol.  Aspiration pneumonitis -acute hypoxic respiratory failure Successfully extubated 8/5 -empiric Unasyn through 8/6 -no persisting clinical signs or symptoms of infection   Shock - septic versus sedation induced Required transient use of pressor support -resolved    ESRD on HD  MWF Care as per Nephrology   Metabolic encephalopathy due to seizure activity and hypoxia Mental status slowly improving - cont to monitor w/ time  Delirium precautions   Right shoulder pain/weakness -cervical spondylosis/herniated disc  with cervical stenosis/myelopathy Cervical MRI reveals herniated disc -right shoulder x-rays suggest degenerative change -Neurosurgery has evaluated and feels he has [name] chronic cervical myelopathy -Kassie Keng C5-6 anterior cervicectomy fusion and plating has been recommended but is not felt to be needed acutely -outpatient follow-up with Neurosurgery will be indicated   Anemia of chronic kidney disease Hemoglobin stable - erythropoietin per Nephrology   SLE Antiphospholipid Syndrome  Not followed by Rheumatology Anticoagulation as above He needs to reestablish with rheum, last saw in 2022 that I saw   Chronic thrombocytopenia Has not kept scheduled appointments with Hematology Trend    HTN  BP on soft side, reduced dose of BP meds, appreciate renal   DM has been ruled out    Peer to peer for inpatient rehab unfortunately declined.    DVT prophylaxis: heparin Code Status: full Family Communication: none Disposition:   Status is: Inpatient Remains inpatient appropriate because: need for continued inpatient care   Consultants:  Neurosurgery PCCM  Procedures:  intubation  Antimicrobials:  Anti-infectives (From admission, onward)    Start     Dose/Rate Route Frequency Ordered Stop   01/20/23 1745  ampicillin-sulbactam (UNASYN) 1.5 g in sodium chloride 0.9 % 100 mL IVPB        1.5 g 200 mL/hr over 30 Minutes Intravenous Every 12 hours 01/20/23 1019 01/23/23 1814   01/19/23 0530  ampicillin-sulbactam (UNASYN) 1.5 g in sodium chloride 0.9 % 100 mL IVPB  Status:  Discontinued        1.5 g 200 mL/hr over 30 Minutes Intravenous Every 12 hours 01/19/23 0445 01/20/23 1019   01/18/23 2230  vancomycin (VANCOCIN) IVPB 1000 mg/200 mL premix  Status:  Discontinued        1,000 mg 200 mL/hr over 60 Minutes Intravenous  Once 01/18/23 2218 01/18/23 2222   01/18/23 2230  metroNIDAZOLE (FLAGYL) IVPB 500 mg        500 mg 100 mL/hr over 60 Minutes Intravenous  Once 01/18/23 2218 01/19/23 0047    01/18/23 2230  ceFEPIme (MAXIPIME) 2 g in sodium chloride 0.9 % 100 mL IVPB        2 g 200 mL/hr over 30 Minutes Intravenous  Once 01/18/23 2222 01/18/23 2323   01/18/23 2230  vancomycin (VANCOREADY) IVPB 2000 mg/400 mL        2,000 mg 200 mL/hr over 120 Minutes Intravenous  Once 01/18/23 2222 01/19/23 0415       Subjective: No complaints  Objective: Vitals:   01/29/23 0500 01/29/23 0756 01/29/23 1239 01/29/23 1622  BP:  107/69 119/89 103/76  Pulse:  73 76 82  Resp:  19 18 19   Temp:  (!) 97.5 F (36.4 C) (!) 97.5 F (36.4 C) 97.6 F (36.4 C)  TempSrc:      SpO2:  100% 100% 98%  Weight: 104.2 kg     Height:        Intake/Output Summary (Last 24 hours) at 01/29/2023 1647 Last data filed at 01/29/2023 0846 Gross per 24 hour  Intake 160 ml  Output --  Net 160 ml    Filed Weights   01/28/23 0827 01/28/23 1255 01/29/23 0500  Weight: 104.3 kg 104.1 kg 104.2 kg    Examination:  General: No acute distress. Cardiovascular: RRR Lungs: unlabored Neurological: Alert.  Moves all extremities 4 with equal strength.  Extremities: No clubbing or cyanosis. No edema.   Data Reviewed: I have personally reviewed following labs and imaging studies  CBC: Recent Labs  Lab 01/24/23 0059 01/25/23 0350 01/26/23 0423 01/27/23 0351 01/28/23 0143  WBC 7.6 7.4 9.8 8.2 9.1  NEUTROABS 5.0  --  6.4 4.7  --   HGB 10.2* 11.2* 12.9* 12.4* 12.4*  HCT 31.3* 33.6* 39.3 38.2* 38.7*  MCV 94.0 94.1 94.0 95.7 93.9  PLT 117* 106* 91* 91* 107*    Basic Metabolic Panel: Recent Labs  Lab 01/24/23 0059 01/25/23 0350 01/26/23 0423 01/27/23 0351 01/28/23 0143  NA 135 134* 133* 134* 134*  K 3.9 3.9 4.2 4.1 4.7  CL 94* 93* 93* 93* 92*  CO2 24 23 22 23  21*  GLUCOSE 85 91 80 94 81  BUN 47* 33* 32* 50* 65*  CREATININE 12.72* 10.38* 9.29* 12.72* 14.61*  CALCIUM 8.9 9.2 9.6 9.1 9.0  MG 2.3 2.1 2.1 2.3 2.5*  PHOS 6.5* 6.1* 6.6* 8.7* 10.4*    GFR: Estimated Creatinine Clearance: 8.3 mL/min  (Lorice Lafave) (by C-G formula based on SCr of 14.61 mg/dL (H)).  Liver Function Tests: Recent Labs  Lab 01/23/23 0433 01/24/23 0059 01/26/23 0423 01/27/23 0351  AST  --  31 29 22   ALT  --  20 25 24   ALKPHOS  --  78 84 87  BILITOT  --  0.9 0.6 0.6  PROT  --  7.8 8.9* 8.5*  ALBUMIN 3.1* 3.3* 3.8 3.7    CBG: Recent Labs  Lab 01/22/23 2355 01/23/23 0318 01/23/23 0717 01/23/23 1118 01/23/23 1520  GLUCAP 110* 82 74 103* 94     No results found for this or any previous visit (from the past 240 hour(s)).        Radiology Studies: VAS US CAROTID  Result Date: 01/28/2023 Carotid Arterial Duplex Study Patient Name:  [name] [name]  Date of Exam:   01/28/2023 Medical Rec #: [id number]       Accession #:    [id number] Date of Birth: March 12, 1981      Patient Gender: M Patient Age:   37 years Exam Location:  [facility] Procedure:      VAS US CAROTID Referring Phys: Brooke Dare --------------------------------------------------------------------------------  Indications:  CVA. Risk Factors: Hypertension, prior CVA. Limitations   Today's exam was limited due to patient movement. Performing Technologist: Shona Simpson  Examination Guidelines: Shamir Tuzzolino complete evaluation includes B-mode imaging, spectral Doppler, color Doppler, and power Doppler as needed of all accessible portions of each vessel. Bilateral testing is considered an integral part of Peder Allums complete examination. Limited examinations for reoccurring indications may be performed as noted.  Right Carotid Findings: +----------+--------+--------+--------+------------------+--------+           PSV cm/sEDV cm/sStenosisPlaque DescriptionComments +----------+--------+--------+--------+------------------+--------+ CCA Prox  62      12                                         +----------+--------+--------+--------+------------------+--------+ CCA Distal65      19                                          +----------+--------+--------+--------+------------------+--------+ ICA Prox  53      12      1-39%  heterogenous               +----------+--------+--------+--------+------------------+--------+ ICA Mid   76      18                                         +----------+--------+--------+--------+------------------+--------+ ICA Distal44      13                                         +----------+--------+--------+--------+------------------+--------+ ECA       63      14                                         +----------+--------+--------+--------+------------------+--------+ +----------+--------+-------+--------+-------------------+           PSV cm/sEDV cmsDescribeArm Pressure (mmHG) +----------+--------+-------+--------+-------------------+ [id number]      6                                  +----------+--------+-------+--------+-------------------+ +---------+--------+--+--------+--+ VertebralPSV cm/s42EDV cm/s12 +---------+--------+--+--------+--+  Left Carotid Findings: +----------+--------+--------+--------+------------------+--------+           PSV cm/sEDV cm/sStenosisPlaque DescriptionComments +----------+--------+--------+--------+------------------+--------+ CCA Prox  82      20                                         +----------+--------+--------+--------+------------------+--------+ CCA Distal76      19                                         +----------+--------+--------+--------+------------------+--------+ ICA Prox  41      17      1-39%   hyperechoic                +----------+--------+--------+--------+------------------+--------+ ICA Mid   92      37                                         +----------+--------+--------+--------+------------------+--------+ ICA Distal91      30                                         +----------+--------+--------+--------+------------------+--------+ ECA       79      19                                          +----------+--------+--------+--------+------------------+--------+ +----------+--------+--------+--------+-------------------+           PSV cm/sEDV cm/sDescribeArm Pressure (mmHG) +----------+--------+--------+--------+-------------------+ [id number]      11                                  +----------+--------+--------+--------+-------------------+ +---------+--------+--+--------+--+  VertebralPSV cm/s42EDV cm/s12 +---------+--------+--+--------+--+   Summary: Right Carotid: Velocities in the right ICA are consistent with Laberta Wilbon 1-39% stenosis. Left Carotid: Velocities in the left ICA are consistent with Emmit Oriley 1-39% stenosis. Vertebrals:  Bilateral vertebral arteries demonstrate antegrade flow. Subclavians: Normal flow hemodynamics were seen in bilateral subclavian              arteries. *See table(s) above for measurements and observations.  Electronically signed by Lemar Livings MD on 01/28/2023 at 6:58:29 PM.    Final    MR ANGIO HEAD WO CONTRAST  Result Date: 01/27/2023 CLINICAL DATA:  Stroke/TIA EXAM: MRA HEAD WITHOUT CONTRAST TECHNIQUE: Angiographic images of the Circle of Willis were acquired using MRA technique without intravenous contrast. COMPARISON:  None Available. FINDINGS: POSTERIOR CIRCULATION: --Vertebral arteries: Normal --Inferior cerebellar arteries: Normal. --Basilar artery: Normal. --Superior cerebellar arteries: Normal. --Posterior cerebral arteries: Normal. ANTERIOR CIRCULATION: --Intracranial internal carotid arteries: Normal. --Anterior cerebral arteries (ACA): Normal. --Middle cerebral arteries (MCA): Normal. IMPRESSION: Normal intracranial MRA. Electronically Signed   By: Deatra Robinson M.D.   On: 01/27/2023 20:25        Scheduled Meds:  atorvastatin  40 mg Oral Daily   carvedilol  3.125 mg Oral BID WC   Chlorhexidine Gluconate Cloth  6 each Topical Q0600   cinacalcet  60 mg Oral Q supper   [START ON 02/11/2023] darbepoetin  (ARANESP) injection - DIALYSIS  60 mcg Subcutaneous Q14 Days   [START ON 01/30/2023] enoxaparin (LOVENOX) injection  100 mg Subcutaneous Q24H   feeding supplement (NEPRO CARB STEADY)  237 mL Oral BID BM   levETIRAcetam  500 mg Oral BID   multivitamin  1 tablet Oral QHS   sevelamer carbonate  1,600 mg Oral TID WC   Continuous Infusions:  sodium chloride 10 mL/hr at 01/23/23 1400     LOS: 10 days    Time spent: over 30 min    Lacretia Nicks, MD [facility]   To contact the attending provider between 7A-7P or the covering provider during after hours 7P-7A, please log into the web site [email or web address] and access using universal [place] password for that web site. If you do not have the password, please call the hospital operator.  01/29/2023, 4:47 PM

## 2023-01-29 NOTE — Plan of Care (Signed)
  Problem: Education: Goal: Ability to describe self-care measures that may prevent or decrease complications (Diabetes Survival Skills Education) will improve Outcome: Progressing Goal: Individualized Educational Video(s) Outcome: Progressing   Problem: Coping: Goal: Ability to adjust to condition or change in health will improve Outcome: Progressing   Problem: Fluid Volume: Goal: Ability to maintain a balanced intake and output will improve Outcome: Progressing   Problem: Health Behavior/Discharge Planning: Goal: Ability to identify and utilize available resources and services will improve Outcome: Progressing Goal: Ability to manage health-related needs will improve Outcome: Progressing   Problem: Metabolic: Goal: Ability to maintain appropriate glucose levels will improve Outcome: Progressing   Problem: Nutritional: Goal: Maintenance of adequate nutrition will improve Outcome: Progressing Goal: Progress toward achieving an optimal weight will improve Outcome: Progressing   Problem: Skin Integrity: Goal: Risk for impaired skin integrity will decrease Outcome: Progressing   Problem: Tissue Perfusion: Goal: Adequacy of tissue perfusion will improve Outcome: Progressing   Problem: Education: Goal: Knowledge of General Education information will improve Description: Including pain rating scale, medication(s)/side effects and non-pharmacologic comfort measures Outcome: Progressing   Problem: Health Behavior/Discharge Planning: Goal: Ability to manage health-related needs will improve Outcome: Progressing   Problem: Clinical Measurements: Goal: Ability to maintain clinical measurements within normal limits will improve Outcome: Progressing Goal: Will remain free from infection Outcome: Progressing Goal: Diagnostic test results will improve Outcome: Progressing Goal: Respiratory complications will improve Outcome: Progressing Goal: Cardiovascular complication will  be avoided Outcome: Progressing   Problem: Activity: Goal: Risk for activity intolerance will decrease Outcome: Progressing   Problem: Nutrition: Goal: Adequate nutrition will be maintained Outcome: Progressing   Problem: Coping: Goal: Level of anxiety will decrease Outcome: Progressing   Problem: Elimination: Goal: Will not experience complications related to bowel motility Outcome: Progressing Goal: Will not experience complications related to urinary retention Outcome: Progressing   Problem: Pain Managment: Goal: General experience of comfort will improve Outcome: Progressing   Problem: Safety: Goal: Ability to remain free from injury will improve Outcome: Progressing   Problem: Skin Integrity: Goal: Risk for impaired skin integrity will decrease Outcome: Progressing   Problem: Education: Goal: Knowledge of disease or condition will improve Outcome: Progressing Goal: Knowledge of secondary prevention will improve (MUST DOCUMENT ALL) Outcome: Progressing Goal: Knowledge of patient specific risk factors will improve (Mark N/A or DELETE if not current risk factor) Outcome: Progressing   Problem: Ischemic Stroke/TIA Tissue Perfusion: Goal: Complications of ischemic stroke/TIA will be minimized Outcome: Progressing   Problem: Coping: Goal: Will verbalize positive feelings about self Outcome: Progressing Goal: Will identify appropriate support needs Outcome: Progressing   Problem: Health Behavior/Discharge Planning: Goal: Ability to manage health-related needs will improve Outcome: Progressing Goal: Goals will be collaboratively established with patient/family Outcome: Progressing   Problem: Self-Care: Goal: Ability to participate in self-care as condition permits will improve Outcome: Progressing Goal: Verbalization of feelings and concerns over difficulty with self-care will improve Outcome: Progressing Goal: Ability to communicate needs accurately will  improve Outcome: Progressing   Problem: Nutrition: Goal: Risk of aspiration will decrease Outcome: Progressing Goal: Dietary intake will improve Outcome: Progressing   

## 2023-01-29 NOTE — TOC Progression Note (Signed)
Transition of Care [facility]) - Progression Note    Patient Details  Name: [name] [name] MRN: [id number] Date of Birth: 1980/07/10  Transition of Care [facility]) CM/SW Contact  Janae Bridgeman, RN Phone Number: 01/29/2023, 2:25 PM  Clinical Narrative:    Patient was diagnosed with acute CVA on 01/26/23.  Patient was seen by PT and Rehab was consulted.  TOC Team will continue to follow the patient for TOC needs.   Expected Discharge Plan: OP Rehab Barriers to Discharge: No Barriers Identified  Expected Discharge Plan and Services   Discharge Planning Services: CM Consult Post Acute Care Choice: Durable Medical Equipment, Resumption of Svcs/PTA Provider Living arrangements for the past 2 months: Hotel/Motel                 DME Arranged: Walker rolling DME Agency: [employer] Date DME Agency Contacted: 01/25/23 Time DME Agency Contacted: 1539 Representative spoke with at DME Agency: Vaughan Basta, CM with [employer]             Social Determinants of Health (SDOH) Interventions    Readmission Risk Interventions    01/25/2023    3:40 PM  Readmission Risk Prevention Plan  Post Dischage Appt Complete  Medication Screening Complete  Transportation Screening Complete

## 2023-01-30 DIAGNOSIS — R569 Unspecified convulsions: Secondary | ICD-10-CM | POA: Diagnosis not present

## 2023-01-30 MED ORDER — HEPARIN SODIUM (PORCINE) 1000 UNIT/ML IJ SOLN: 3200.0000 [IU] | Freq: Once | INTRAMUSCULAR | Status: DC

## 2023-01-30 MED ORDER — HEPARIN SODIUM (PORCINE) 1000 UNIT/ML IJ SOLN
3200.0000 [IU] | Freq: Once | INTRAMUSCULAR | Status: AC
  Administered 2023-01-30: 3200 [IU]
  Filled 2023-01-30: qty 4

## 2023-01-30 MED ORDER — MIDODRINE HCL 5 MG PO TABS: 5.0000 mg | ORAL_TABLET | Freq: Once | ORAL | Status: DC | PRN

## 2023-01-30 MED ORDER — SEVELAMER CARBONATE 800 MG PO TABS
2400.0000 mg | ORAL_TABLET | Freq: Three times a day (TID) | ORAL | Status: DC
  Administered 2023-01-30 – 2023-02-12 (×34): 2400 mg via ORAL
  Filled 2023-01-30 (×33): qty 3

## 2023-01-30 MED ORDER — WARFARIN SODIUM 7.5 MG PO TABS
7.5000 mg | ORAL_TABLET | Freq: Once | ORAL | Status: AC
  Administered 2023-01-30: 7.5 mg via ORAL
  Filled 2023-01-30: qty 1

## 2023-01-30 MED ORDER — SODIUM CHLORIDE 0.9 % IV BOLUS
250.0000 mL | Freq: Once | INTRAVENOUS | Status: AC
  Administered 2023-01-30: 250 mL via INTRAVENOUS

## 2023-01-30 NOTE — Plan of Care (Signed)
Problem: Education: Goal: Ability to describe self-care measures that may prevent or decrease complications (Diabetes Survival Skills Education) will improve 01/30/2023 2349 by Tennis Ship, RN Outcome: Progressing 01/30/2023 2349 by Tennis Ship, RN Outcome: Progressing Goal: Individualized Educational Video(s) 01/30/2023 2349 by Tennis Ship, RN Outcome: Progressing 01/30/2023 2349 by Tennis Ship, RN Outcome: Progressing   Problem: Coping: Goal: Ability to adjust to condition or change in health will improve 01/30/2023 2349 by Tennis Ship, RN Outcome: Progressing 01/30/2023 2349 by Tennis Ship, RN Outcome: Progressing   Problem: Fluid Volume: Goal: Ability to maintain a balanced intake and output will improve 01/30/2023 2349 by Tennis Ship, RN Outcome: Progressing 01/30/2023 2349 by Tennis Ship, RN Outcome: Progressing   Problem: Health Behavior/Discharge Planning: Goal: Ability to identify and utilize available resources and services will improve 01/30/2023 2349 by Tennis Ship, RN Outcome: Progressing 01/30/2023 2349 by Tennis Ship, RN Outcome: Progressing Goal: Ability to manage health-related needs will improve 01/30/2023 2349 by Tennis Ship, RN Outcome: Progressing 01/30/2023 2349 by Tennis Ship, RN Outcome: Progressing   Problem: Metabolic: Goal: Ability to maintain appropriate glucose levels will improve 01/30/2023 2349 by Tennis Ship, RN Outcome: Progressing 01/30/2023 2349 by Tennis Ship, RN Outcome: Progressing   Problem: Nutritional: Goal: Maintenance of adequate nutrition will improve 01/30/2023 2349 by Tennis Ship, RN Outcome: Progressing 01/30/2023 2349 by Tennis Ship, RN Outcome: Progressing Goal: Progress toward achieving an optimal weight will improve 01/30/2023 2349 by Tennis Ship, RN Outcome: Progressing 01/30/2023 2349 by Tennis Ship,  RN Outcome: Progressing   Problem: Skin Integrity: Goal: Risk for impaired skin integrity will decrease 01/30/2023 2349 by Tennis Ship, RN Outcome: Progressing 01/30/2023 2349 by Tennis Ship, RN Outcome: Progressing   Problem: Tissue Perfusion: Goal: Adequacy of tissue perfusion will improve 01/30/2023 2349 by Tennis Ship, RN Outcome: Progressing 01/30/2023 2349 by Tennis Ship, RN Outcome: Progressing   Problem: Education: Goal: Knowledge of General Education information will improve Description: Including pain rating scale, medication(s)/side effects and non-pharmacologic comfort measures 01/30/2023 2349 by Tennis Ship, RN Outcome: Progressing 01/30/2023 2349 by Tennis Ship, RN Outcome: Progressing   Problem: Health Behavior/Discharge Planning: Goal: Ability to manage health-related needs will improve 01/30/2023 2349 by Tennis Ship, RN Outcome: Progressing 01/30/2023 2349 by Tennis Ship, RN Outcome: Progressing   Problem: Clinical Measurements: Goal: Ability to maintain clinical measurements within normal limits will improve 01/30/2023 2349 by Tennis Ship, RN Outcome: Progressing 01/30/2023 2349 by Tennis Ship, RN Outcome: Progressing Goal: Will remain free from infection 01/30/2023 2349 by Tennis Ship, RN Outcome: Progressing 01/30/2023 2349 by Tennis Ship, RN Outcome: Progressing Goal: Diagnostic test results will improve 01/30/2023 2349 by Tennis Ship, RN Outcome: Progressing 01/30/2023 2349 by Tennis Ship, RN Outcome: Progressing Goal: Respiratory complications will improve 01/30/2023 2349 by Tennis Ship, RN Outcome: Progressing 01/30/2023 2349 by Tennis Ship, RN Outcome: Progressing Goal: Cardiovascular complication will be avoided 01/30/2023 2349 by Tennis Ship, RN Outcome: Progressing 01/30/2023 2349 by Tennis Ship, RN Outcome: Progressing    Problem: Activity: Goal: Risk for activity intolerance will decrease 01/30/2023 2349 by Tennis Ship, RN Outcome: Progressing 01/30/2023 2349 by Tennis Ship, RN Outcome: Progressing   Problem: Nutrition: Goal: Adequate nutrition will be maintained 01/30/2023 2349 by Tennis Ship, RN Outcome: Progressing 01/30/2023 2349 by Tennis Ship, RN Outcome: Progressing  Problem: Coping: Goal: Level of anxiety will decrease 01/30/2023 2349 by Tennis Ship, RN Outcome: Progressing 01/30/2023 2349 by Tennis Ship, RN Outcome: Progressing   Problem: Elimination: Goal: Will not experience complications related to bowel motility 01/30/2023 2349 by Tennis Ship, RN Outcome: Progressing 01/30/2023 2349 by Tennis Ship, RN Outcome: Progressing Goal: Will not experience complications related to urinary retention 01/30/2023 2349 by Tennis Ship, RN Outcome: Progressing 01/30/2023 2349 by Tennis Ship, RN Outcome: Progressing   Problem: Pain Managment: Goal: General experience of comfort will improve 01/30/2023 2349 by Tennis Ship, RN Outcome: Progressing 01/30/2023 2349 by Tennis Ship, RN Outcome: Progressing   Problem: Safety: Goal: Ability to remain free from injury will improve 01/30/2023 2349 by Tennis Ship, RN Outcome: Progressing 01/30/2023 2349 by Tennis Ship, RN Outcome: Progressing   Problem: Skin Integrity: Goal: Risk for impaired skin integrity will decrease 01/30/2023 2349 by Tennis Ship, RN Outcome: Progressing 01/30/2023 2349 by Tennis Ship, RN Outcome: Progressing   Problem: Education: Goal: Knowledge of disease or condition will improve 01/30/2023 2349 by Tennis Ship, RN Outcome: Progressing 01/30/2023 2349 by Tennis Ship, RN Outcome: Progressing Goal: Knowledge of secondary prevention will improve (MUST DOCUMENT ALL) 01/30/2023 2349 by Tennis Ship,  RN Outcome: Progressing 01/30/2023 2349 by Tennis Ship, RN Outcome: Progressing Goal: Knowledge of patient specific risk factors will improve Loraine Leriche N/A or DELETE if not current risk factor) 01/30/2023 2349 by Tennis Ship, RN Outcome: Progressing 01/30/2023 2349 by Tennis Ship, RN Outcome: Progressing   Problem: Ischemic Stroke/TIA Tissue Perfusion: Goal: Complications of ischemic stroke/TIA will be minimized 01/30/2023 2349 by Tennis Ship, RN Outcome: Progressing 01/30/2023 2349 by Tennis Ship, RN Outcome: Progressing   Problem: Coping: Goal: Will verbalize positive feelings about self 01/30/2023 2349 by Tennis Ship, RN Outcome: Progressing 01/30/2023 2349 by Tennis Ship, RN Outcome: Progressing Goal: Will identify appropriate support needs 01/30/2023 2349 by Tennis Ship, RN Outcome: Progressing 01/30/2023 2349 by Tennis Ship, RN Outcome: Progressing   Problem: Health Behavior/Discharge Planning: Goal: Ability to manage health-related needs will improve 01/30/2023 2349 by Tennis Ship, RN Outcome: Progressing 01/30/2023 2349 by Tennis Ship, RN Outcome: Progressing Goal: Goals will be collaboratively established with patient/family 01/30/2023 2349 by Tennis Ship, RN Outcome: Progressing 01/30/2023 2349 by Tennis Ship, RN Outcome: Progressing   Problem: Self-Care: Goal: Ability to participate in self-care as condition permits will improve 01/30/2023 2349 by Tennis Ship, RN Outcome: Progressing 01/30/2023 2349 by Tennis Ship, RN Outcome: Progressing Goal: Verbalization of feelings and concerns over difficulty with self-care will improve 01/30/2023 2349 by Tennis Ship, RN Outcome: Progressing 01/30/2023 2349 by Tennis Ship, RN Outcome: Progressing Goal: Ability to communicate needs accurately will improve 01/30/2023 2349 by Tennis Ship, RN Outcome:  Progressing 01/30/2023 2349 by Tennis Ship, RN Outcome: Progressing   Problem: Nutrition: Goal: Risk of aspiration will decrease 01/30/2023 2349 by Tennis Ship, RN Outcome: Progressing 01/30/2023 2349 by Tennis Ship, RN Outcome: Progressing Goal: Dietary intake will improve 01/30/2023 2349 by Tennis Ship, RN Outcome: Progressing 01/30/2023 2349 by Tennis Ship, RN Outcome: Progressing
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
## 2023-01-30 NOTE — Progress Notes (Signed)
Received patient in bed to unit.  Alert and oriented.  Informed consent signed and in chart.   TX duration:3.5 hours  Patient tolerated well.  Transported back to the room  Alert, without acute distress.  Hand-off given to patient's nurse.   Access used: Right internal jugular Cath Access issues: none  Total UF removed: Medication(s) given: Benedryl, see MAR   01/30/23 1211  Vitals  Temp 97.9 F (36.6 C)  Temp Source Oral  BP 99/67  BP Location Right Arm  BP Method Automatic  Patient Position (if appropriate) Lying  Pulse Rate 73  Pulse Rate Source Monitor  Resp 12  MEWS COLOR  MEWS Score Color Yellow  Oxygen Therapy  SpO2 99 %  O2 Device Room Air  Patient Activity (if Appropriate) In bed  Pulse Oximetry Type Continuous  MEWS Score  MEWS Temp 0  MEWS Systolic 1  MEWS Pulse 0  MEWS RR 1  MEWS LOC 0  MEWS Score 2     Stacie Glaze LPN [facility]
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
## 2023-01-30 NOTE — Plan of Care (Signed)
  Problem: Education: Goal: Ability to describe self-care measures that may prevent or decrease complications (Diabetes Survival Skills Education) will improve Outcome: Progressing Goal: Individualized Educational Video(s) Outcome: Progressing   Problem: Coping: Goal: Ability to adjust to condition or change in health will improve Outcome: Progressing   Problem: Fluid Volume: Goal: Ability to maintain a balanced intake and output will improve Outcome: Progressing   Problem: Health Behavior/Discharge Planning: Goal: Ability to identify and utilize available resources and services will improve Outcome: Progressing Goal: Ability to manage health-related needs will improve Outcome: Progressing   Problem: Metabolic: Goal: Ability to maintain appropriate glucose levels will improve Outcome: Progressing   Problem: Nutritional: Goal: Maintenance of adequate nutrition will improve Outcome: Progressing Goal: Progress toward achieving an optimal weight will improve Outcome: Progressing   Problem: Skin Integrity: Goal: Risk for impaired skin integrity will decrease Outcome: Progressing   Problem: Tissue Perfusion: Goal: Adequacy of tissue perfusion will improve Outcome: Progressing   Problem: Education: Goal: Knowledge of General Education information will improve Description: Including pain rating scale, medication(s)/side effects and non-pharmacologic comfort measures Outcome: Progressing   Problem: Health Behavior/Discharge Planning: Goal: Ability to manage health-related needs will improve Outcome: Progressing   Problem: Clinical Measurements: Goal: Ability to maintain clinical measurements within normal limits will improve Outcome: Progressing Goal: Will remain free from infection Outcome: Progressing Goal: Diagnostic test results will improve Outcome: Progressing Goal: Respiratory complications will improve Outcome: Progressing Goal: Cardiovascular complication will  be avoided Outcome: Progressing   Problem: Activity: Goal: Risk for activity intolerance will decrease Outcome: Progressing   Problem: Nutrition: Goal: Adequate nutrition will be maintained Outcome: Progressing   Problem: Coping: Goal: Level of anxiety will decrease Outcome: Progressing   Problem: Elimination: Goal: Will not experience complications related to bowel motility Outcome: Progressing Goal: Will not experience complications related to urinary retention Outcome: Progressing   Problem: Pain Managment: Goal: General experience of comfort will improve Outcome: Progressing   Problem: Safety: Goal: Ability to remain free from injury will improve Outcome: Progressing   Problem: Skin Integrity: Goal: Risk for impaired skin integrity will decrease Outcome: Progressing   Problem: Education: Goal: Knowledge of disease or condition will improve Outcome: Progressing Goal: Knowledge of secondary prevention will improve (MUST DOCUMENT ALL) Outcome: Progressing Goal: Knowledge of patient specific risk factors will improve (Mark N/A or DELETE if not current risk factor) Outcome: Progressing   Problem: Ischemic Stroke/TIA Tissue Perfusion: Goal: Complications of ischemic stroke/TIA will be minimized Outcome: Progressing   Problem: Coping: Goal: Will verbalize positive feelings about self Outcome: Progressing Goal: Will identify appropriate support needs Outcome: Progressing   Problem: Health Behavior/Discharge Planning: Goal: Ability to manage health-related needs will improve Outcome: Progressing Goal: Goals will be collaboratively established with patient/family Outcome: Progressing   Problem: Self-Care: Goal: Ability to participate in self-care as condition permits will improve Outcome: Progressing Goal: Verbalization of feelings and concerns over difficulty with self-care will improve Outcome: Progressing Goal: Ability to communicate needs accurately will  improve Outcome: Progressing   Problem: Nutrition: Goal: Risk of aspiration will decrease Outcome: Progressing Goal: Dietary intake will improve Outcome: Progressing   

## 2023-01-30 NOTE — Progress Notes (Signed)
ANTICOAGULATION CONSULT NOTE - Follow Up Consult  Pharmacy Consult for Enoxaparin and Warfarin Indication:  antiphospholipid syndrome  Allergies  Allergen Reactions   Cetirizine & Related Swelling    Patient Measurements: Height: 6\' 1"  (185.4 cm) Weight: 105.8 kg (233 lb 4 oz) IBW/kg (Calculated) : 79.9 Enoxaparin Dosing Weight: 104 kg   Vital Signs: Temp: 97.5 F (36.4 C) (08/14 0445) Temp Source: Oral (08/13 1959) BP: 116/86 (08/14 0445) Pulse Rate: 73 (08/14 0445)  Labs: Recent Labs    01/28/23 0143 01/29/23 1543 01/30/23 0648  HGB 12.4*  --  12.8*  HCT 38.7*  --  39.6  PLT 107*  --  116*  LABPROT  --  14.9 15.8*  INR  --  1.1 1.2  CREATININE 14.61*  --   --    Assessment: 41yoM with hx of lupus anticoagulant/antiphospholipid syndrome was supposed to be on apixaban prior to admission, but last picked up Dec 2023 for 90 day supply. Pharmacy consulted to restart apixaban on 01/27/23.  Apixaban 5 mg given x 2 on 8/11 and ~2pm on 01/28/23. Pharmacy consulted to transition to full-dose Enoxaparin.  ESRD with usual MWF HD. Discussed with Drs. Lowell Guitar and Pearlean Brownie, who are aware of increased risk of bleeding with Enoxaparin and ESRD due to potential accumulation of active heparin metabolites that are undetected by anti-Xa levels. They would like to proceed with Enoxaparin for now.    Decision made to change longterm AC to warfarin 8/13. Bridging with Lovenox. INR today is up slightly to 1.2. Chronic thrombocytopenia, CBC stable. Not currently on any interacting medications.  Goal of Therapy:  INR 2-3 Anti-Xa level 0.6-1 units/ml 4hrs after LMWH dose given Monitor platelets by anticoagulation protocol: Yes   Plan:  Warfarin 7.5mg  PO x 1 again today Enoxaparin 100 mg SQ q24h until INR therapeutic Will plan to check anti-Xa level after 3-4 doses CBC at least q72hrs. Will try to time with MWF with HD labs. Monitor for signs/symptoms of bleeding   Leia Alf, PharmD,  BCPS Please check AMION for all [facility] contact numbers Clinical Pharmacist 01/30/2023 7:43 AM

## 2023-01-30 NOTE — Progress Notes (Signed)
  [facility] Progress Note   Subjective:   Seen on dialysis today.  No complaints.  Objective Vitals:   01/30/23 0900 01/30/23 0930 01/30/23 1000 01/30/23 1030  BP: (!) 125/96 108/83 94/72 92/75   Pulse: (!) 57 65 75 87  Resp: 16 13 13 14   Temp:      TempSrc:      SpO2: 99% 96% 98% 99%  Weight:      Height:       Physical Exam General: Awake and alert, sitting on bed Heart: Normal rate, no rub Lungs: Bilateral chest with no increased work of breathing Abdomen: soft nondistended Extremities: trace edema, warm and well-perfused Dialysis Access: [facility] c/d/i    Dialysis Orders: [facility] MWF 4h   400/800 112.8kg  2/2 bath  TDC  Heparin none - Mircera 120 mcg IV q 2 weeks (last dose 01/16/2023) - Venofer 50 mg IV q week   Assessment/Plan: Seizures: Had not been taking OP meds. Found down at [employer].  Now improved Acute CVA - per MRI done 8/10 w/ cerebellar CVA w/o hemorrhage. Mgmt per neuro and primary Acute hypoxic respiratory failure: pulm edema + possible aspiration PNA. Rx'd w/ HD and IV abx. Resolved.   Volume - under dry wt. Repeat CXR 8/10 showed resolution of edema. Looks like a good new dry wt at 106kg; will adjust if needed  BP - BP's are labile with some in the 90's. Stopped norvasc; continue to decrease meds prn  ESRD -  MWF HD. Continue schedule  Anemia  - Hb dropped early this admit into 7's, but with volume removal and esa Rx, Hb has improved up to 10-12 here. Changed aranesp to q2 weeks  Metabolic bone disease -  Corrected Ca+ 10 PO4 elevated. Started renvela as binder and titrating prn and resume sensipar 60mg  every day.   Nutrition - push protein    01/30/2023, 10:56 AM  Recent Labs  Lab 01/27/23 0351 01/28/23 0143 01/30/23 0648  HGB 12.4* 12.4* 12.8*  ALBUMIN 3.7  --  4.0  CALCIUM 9.1 9.0 8.9  PHOS 8.7* 10.4* 10.7*  CREATININE 12.72* 14.61* 15.18*  K 4.1 4.7 5.2*    Inpatient medications:  atorvastatin  40 mg Oral Daily    carvedilol  3.125 mg Oral BID WC   Chlorhexidine Gluconate Cloth  6 each Topical Q0600   cinacalcet  60 mg Oral Q supper   [START ON 02/11/2023] darbepoetin (ARANESP) injection - DIALYSIS  60 mcg Subcutaneous Q14 Days   enoxaparin (LOVENOX) injection  100 mg Subcutaneous Q24H   feeding supplement (NEPRO CARB STEADY)  237 mL Oral BID BM   levETIRAcetam  500 mg Oral BID   multivitamin  1 tablet Oral QHS   sevelamer carbonate  1,600 mg Oral TID WC   warfarin  7.5 mg Oral ONCE-1600   Warfarin - Pharmacist Dosing Inpatient   Does not apply q1600    sodium chloride 10 mL/hr at 01/23/23 1400   acetaminophen, diphenhydrAMINE, docusate sodium, LORazepam, ondansetron (ZOFRAN) IV, mouth rinse, polyethylene glycol

## 2023-01-30 NOTE — Procedures (Signed)
I was present at this dialysis session. I have reviewed the session itself and made appropriate changes.   Filed Weights   01/29/23 0500 01/30/23 0500 01/30/23 0757  Weight: 104.2 kg 105.8 kg 104.7 kg    Recent Labs  Lab 01/30/23 0648  NA 135  K 5.2*  CL 92*  CO2 24  GLUCOSE 86  BUN 67*  CREATININE 15.18*  CALCIUM 8.9  PHOS 10.7*    Recent Labs  Lab 01/26/23 0423 01/27/23 0351 01/28/23 0143 01/30/23 0648  WBC 9.8 8.2 9.1 7.6  NEUTROABS 6.4 4.7  --  4.7  HGB 12.9* 12.4* 12.4* 12.8*  HCT 39.3 38.2* 38.7* 39.6  MCV 94.0 95.7 93.9 94.5  PLT 91* 91* 107* 116*    Scheduled Meds:  atorvastatin  40 mg Oral Daily   carvedilol  3.125 mg Oral BID WC   Chlorhexidine Gluconate Cloth  6 each Topical Q0600   cinacalcet  60 mg Oral Q supper   [START ON 02/11/2023] darbepoetin (ARANESP) injection - DIALYSIS  60 mcg Subcutaneous Q14 Days   enoxaparin (LOVENOX) injection  100 mg Subcutaneous Q24H   feeding supplement (NEPRO CARB STEADY)  237 mL Oral BID BM   levETIRAcetam  500 mg Oral BID   multivitamin  1 tablet Oral QHS   sevelamer carbonate  1,600 mg Oral TID WC   warfarin  7.5 mg Oral ONCE-1600   Warfarin - Pharmacist Dosing Inpatient   Does not apply q1600   Continuous Infusions:  sodium chloride 10 mL/hr at 01/23/23 1400   PRN Meds:.acetaminophen, diphenhydrAMINE, docusate sodium, LORazepam, ondansetron (ZOFRAN) IV, mouth rinse, polyethylene glycol   Louie Bun,  MD 01/30/2023, 10:55 AM

## 2023-01-30 NOTE — Progress Notes (Signed)
PT Cancellation Note  Patient Details Name: [name] [name] MRN: [id number] DOB: 17-Mar-1981   Cancelled Treatment:    Reason Eval/Treat Not Completed: (P) Patient at procedure or test/unavailable, pt off unit at HD. Will check back as schedule allows to continue with PT POC.  Lenora Boys. PTA Acute Rehabilitation Services Office: [phone]    Catalina Antigua 01/30/2023, 12:23 PM

## 2023-01-31 DIAGNOSIS — R569 Unspecified convulsions: Secondary | ICD-10-CM | POA: Diagnosis not present

## 2023-01-31 LAB — PROTIME-INR
INR: 1.2 (ref 0.8–1.2)
Prothrombin Time: 15.5 s — ABNORMAL HIGH (ref 11.4–15.2)

## 2023-01-31 MED ORDER — WARFARIN SODIUM 7.5 MG PO TABS
7.5000 mg | ORAL_TABLET | Freq: Once | ORAL | Status: AC
  Administered 2023-01-31: 7.5 mg via ORAL
  Filled 2023-01-31: qty 1

## 2023-01-31 NOTE — Plan of Care (Signed)
  Problem: Education: Goal: Ability to describe self-care measures that may prevent or decrease complications (Diabetes Survival Skills Education) will improve Outcome: Progressing Goal: Individualized Educational Video(s) Outcome: Progressing   Problem: Coping: Goal: Ability to adjust to condition or change in health will improve Outcome: Progressing   Problem: Fluid Volume: Goal: Ability to maintain a balanced intake and output will improve Outcome: Progressing   Problem: Health Behavior/Discharge Planning: Goal: Ability to identify and utilize available resources and services will improve Outcome: Progressing Goal: Ability to manage health-related needs will improve Outcome: Progressing   Problem: Metabolic: Goal: Ability to maintain appropriate glucose levels will improve Outcome: Progressing   Problem: Nutritional: Goal: Maintenance of adequate nutrition will improve Outcome: Progressing Goal: Progress toward achieving an optimal weight will improve Outcome: Progressing   Problem: Skin Integrity: Goal: Risk for impaired skin integrity will decrease Outcome: Progressing   Problem: Tissue Perfusion: Goal: Adequacy of tissue perfusion will improve Outcome: Progressing   Problem: Education: Goal: Knowledge of General Education information will improve Description: Including pain rating scale, medication(s)/side effects and non-pharmacologic comfort measures Outcome: Progressing   Problem: Health Behavior/Discharge Planning: Goal: Ability to manage health-related needs will improve Outcome: Progressing   Problem: Clinical Measurements: Goal: Ability to maintain clinical measurements within normal limits will improve Outcome: Progressing Goal: Will remain free from infection Outcome: Progressing Goal: Diagnostic test results will improve Outcome: Progressing Goal: Respiratory complications will improve Outcome: Progressing Goal: Cardiovascular complication will  be avoided Outcome: Progressing   Problem: Activity: Goal: Risk for activity intolerance will decrease Outcome: Progressing   Problem: Nutrition: Goal: Adequate nutrition will be maintained Outcome: Progressing   Problem: Coping: Goal: Level of anxiety will decrease Outcome: Progressing   Problem: Elimination: Goal: Will not experience complications related to bowel motility Outcome: Progressing Goal: Will not experience complications related to urinary retention Outcome: Progressing   Problem: Pain Managment: Goal: General experience of comfort will improve Outcome: Progressing   Problem: Safety: Goal: Ability to remain free from injury will improve Outcome: Progressing   Problem: Skin Integrity: Goal: Risk for impaired skin integrity will decrease Outcome: Progressing   Problem: Education: Goal: Knowledge of disease or condition will improve Outcome: Progressing Goal: Knowledge of secondary prevention will improve (MUST DOCUMENT ALL) Outcome: Progressing Goal: Knowledge of patient specific risk factors will improve (Mark N/A or DELETE if not current risk factor) Outcome: Progressing   Problem: Ischemic Stroke/TIA Tissue Perfusion: Goal: Complications of ischemic stroke/TIA will be minimized Outcome: Progressing   Problem: Coping: Goal: Will verbalize positive feelings about self Outcome: Progressing Goal: Will identify appropriate support needs Outcome: Progressing   Problem: Health Behavior/Discharge Planning: Goal: Ability to manage health-related needs will improve Outcome: Progressing Goal: Goals will be collaboratively established with patient/family Outcome: Progressing   Problem: Self-Care: Goal: Ability to participate in self-care as condition permits will improve Outcome: Progressing Goal: Verbalization of feelings and concerns over difficulty with self-care will improve Outcome: Progressing Goal: Ability to communicate needs accurately will  improve Outcome: Progressing   Problem: Nutrition: Goal: Risk of aspiration will decrease Outcome: Progressing Goal: Dietary intake will improve Outcome: Progressing   
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
## 2023-01-31 NOTE — Discharge Instructions (Addendum)

## 2023-01-31 NOTE — Progress Notes (Signed)
ANTICOAGULATION CONSULT NOTE - Follow Up Consult  Pharmacy Consult for Enoxaparin and Warfarin Indication:  antiphospholipid syndrome  Allergies  Allergen Reactions   Cetirizine & Related Swelling    Patient Measurements: Height: 6\' 1"  (185.4 cm) Weight: 104.2 kg (229 lb 11.5 oz) IBW/kg (Calculated) : 79.9 Enoxaparin Dosing Weight: 104 kg   Vital Signs: Temp: 97.6 F (36.4 C) (08/15 0733) Temp Source: Oral (08/15 0733) BP: 89/71 (08/15 0733) Pulse Rate: 69 (08/15 0733)  Labs: Recent Labs    01/29/23 1543 01/30/23 0648 01/31/23 0158  HGB  --  12.8*  --   HCT  --  39.6  --   PLT  --  116*  --   LABPROT 14.9 15.8* 15.5*  INR 1.1 1.2 1.2  CREATININE  --  15.18*  --    Assessment: 41yoM with hx of lupus anticoagulant/antiphospholipid syndrome was supposed to be on apixaban prior to admission, but last picked up Dec 2023 for 90 day supply. Pharmacy consulted to restart apixaban on 01/27/23.  Apixaban 5 mg given x 2 on 8/11 and ~2pm on 01/28/23. Pharmacy consulted to transition to full-dose Enoxaparin.  ESRD with usual MWF HD. Discussed with Drs. Lowell Guitar and Pearlean Brownie, who are aware of increased risk of bleeding with Enoxaparin and ESRD due to potential accumulation of active heparin metabolites that are undetected by anti-Xa levels. They would like to proceed with Enoxaparin for now.    Decision made to change longterm AC to warfarin 8/13. Bridging with Lovenox. INR today still remains at 1.2. Chronic thrombocytopenia, CBC stable. Not currently on any interacting medications.  Goal of Therapy:  INR 2-3 Anti-Xa level 0.6-1 units/ml 4hrs after LMWH dose given Monitor platelets by anticoagulation protocol: Yes   Plan:  Warfarin 7.5mg  PO x 1 again today Enoxaparin 100 mg SQ q24h until INR therapeutic Will plan to check anti-Xa level after 3-4 doses  CBC at least q72hrs. Will try to time with MWF with HD labs. Monitor for signs/symptoms of bleeding   Gwynn Burly,  PharmD 01/31/2023 7:38 AM

## 2023-01-31 NOTE — TOC Progression Note (Addendum)
Transition of Care [facility]) - Progression Note    Patient Details  Name: TOKUO ABAJIAN MRN: [id number] Date of Birth: [birth year]  Transition of Care [facility]) CM/SW Contact  Anahi Belmar A [place], [place] Phone Number: 01/31/2023, 4:53 PM  Clinical Narrative:     CSW attempted to present bed offers to pt. Pt was unable to complete decision due to orientation. CSW requested contact for pt's mother to go over bed offers. Pt did not know phone contact and pt's phone needed charging. Nursing notified to assist pt with charging phone and obtaining contact information.   CSW made attempt to reach out to pt's friend, Desma Paganini, with pt's permission, to inqure about his mother's contact.   CSW contacted Rashawn, who stated pt had been speaking to while admitted. He stated he did not have pt's mother's contact and that pt was the only one with that information.  TOC will continue to follow.   Expected Discharge Plan: OP Rehab Barriers to Discharge: No Barriers Identified  Expected Discharge Plan and Services   Discharge Planning Services: CM Consult Post Acute Care Choice: Durable Medical Equipment, Resumption of Svcs/PTA Provider Living arrangements for the past 2 months: Hotel/Motel                 DME Arranged: Walker rolling DME Agency: [employer] Date DME Agency Contacted: 01/25/23 Time DME Agency Contacted: 1539 Representative spoke with at DME Agency: Vaughan Basta, CM with [employer]             Social Determinants of Health (SDOH) Interventions    Readmission Risk Interventions    01/25/2023    3:40 PM  Readmission Risk Prevention Plan  Post Dischage Appt Complete  Medication Screening Complete  Transportation Screening Complete

## 2023-01-31 NOTE — Progress Notes (Signed)
Pt receives out-pt HD at [facility]) on MWF 7:55 chair time. Will assist as needed.   Olivia Canter Renal Navigator [phone]

## 2023-01-31 NOTE — Progress Notes (Signed)
  [facility] Progress Note   Subjective:   Patient resting in bed today with no complaints  Objective Vitals:   01/31/23 0234 01/31/23 0547 01/31/23 0600 01/31/23 0733  BP: 100/73 (!) 86/63  (!) 89/71  Pulse: 76 71  69  Resp: 18   18  Temp: (!) 97.5 F (36.4 C)   97.6 F (36.4 C)  TempSrc: Oral   Oral  SpO2: 96% 100%  98%  Weight:   104.2 kg   Height:       Physical Exam General: Sleeping but wakes up and talks, lying in bed  Heart: Normal rate, no rub Lungs: Bilateral chest with no increased work of breathing Abdomen: soft nondistended Extremities: trace edema, warm and well-perfused Dialysis Access: [facility] c/d/i    Dialysis Orders: [facility] MWF 4h   400/800 112.8kg  2/2 bath  TDC  Heparin none - Mircera 120 mcg IV q 2 weeks (last dose 01/16/2023) - Venofer 50 mg IV q week   Assessment/Plan: Seizures: Had not been taking OP meds. Found down at [employer].  Now improved Acute CVA - per MRI done 8/10 w/ cerebellar CVA w/o hemorrhage.  Recommendation was to switch Eliquis to warfarin.  In the process.  Mgmt per neuro and primary Acute hypoxic respiratory failure: pulm edema + possible aspiration PNA. Rx'd w/ HD and IV abx. Resolved.   Volume - under dry wt. Repeat CXR 8/10 showed resolution of edema. Challenging weight and down as low as 103. Adjust EDW at DC as needed  BP - BP's are labile with some in the 90's. Stopped norvasc; carvedilol with hold parameters  ESRD -  MWF HD. Continue schedule  Anemia  - Hb dropped early this admit into 7's, but with volume removal and esa Rx, Hb has improved up to 10-12 here. Changed aranesp to q2 weeks  Metabolic bone disease -  Corrected Ca+ 10 PO4 elevated. Started renvela as binder and titrating prn and resume sensipar 60mg  every day.   Nutrition - push protein    01/31/2023, 9:36 AM  Recent Labs  Lab 01/27/23 0351 01/28/23 0143 01/30/23 0648  HGB 12.4* 12.4* 12.8*  ALBUMIN 3.7  --  4.0  CALCIUM 9.1 9.0  8.9  PHOS 8.7* 10.4* 10.7*  CREATININE 12.72* 14.61* 15.18*  K 4.1 4.7 5.2*    Inpatient medications:  atorvastatin  40 mg Oral Daily   carvedilol  3.125 mg Oral BID WC   Chlorhexidine Gluconate Cloth  6 each Topical Q0600   cinacalcet  60 mg Oral Q supper   [START ON 02/11/2023] darbepoetin (ARANESP) injection - DIALYSIS  60 mcg Subcutaneous Q14 Days   enoxaparin (LOVENOX) injection  100 mg Subcutaneous Q24H   feeding supplement (NEPRO CARB STEADY)  237 mL Oral BID BM   levETIRAcetam  500 mg Oral BID   multivitamin  1 tablet Oral QHS   sevelamer carbonate  2,400 mg Oral TID WC   warfarin  7.5 mg Oral ONCE-1600   Warfarin - Pharmacist Dosing Inpatient   Does not apply q1600    sodium chloride 10 mL/hr at 01/23/23 1400   acetaminophen, diphenhydrAMINE, docusate sodium, LORazepam, ondansetron (ZOFRAN) IV, mouth rinse, polyethylene glycol

## 2023-01-31 NOTE — Progress Notes (Signed)
PROGRESS NOTE    [name] [name]  UJW:[id number] DOB: 1980/07/05 DOA: 01/18/2023 PCP: No primary care provider on file.    Brief Narrative:   [name] [name] is a 42 y.o. 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  Suspected aspiration event.  He required intubation.  Significant Events: 8/2 admitted after seizure with resultant hypoxic respiratory failure -intubation 8/3 HD initiated for volume overload -stopped due to hypotension 8/3 EEG noted diffuse slowing but no seizure or epileptogenicity 8/5 extubated -found to have right arm weakness/unable to lift right arm above shoulder 8/6 TRH assumed care - transferred to Med/Surg bed  8/10 MRI with acute stroke  8/13 pending SNF placement  Assessment & Plan:   Acute CVA MRI brain with acute infarct left PCA territory, multiple small foci of acute ischemia left cerebellar hemisphere, single focus of acute ischemia right cerebellum.  MRA head with no acute findings.  Carotid ultrasound with 1-39% stenosis bilaterally.  TTE with LVEF 55-60%, no regional wall motion normalities, severe LV H, mildly reduced RV systolic function, mild/moderate MV regurgitation.  LDL 105, hemoglobin A1c 5.2.  Etiology likely related to hypercoagulable disorder from SLE/antiphospholipid antibody versus cardiogenic embolism.  Neurology was consulted and followed during hospital course.  Recommending switching Eliquis to to Lovenox, although per pharmacy not a great recommendation given his ESRD status.  Will continue Lovenox bridge and initiated on warfarin; with goal INR 2-3.Marland Kitchen  Recommend to avoid driving until peripheral vision loss improves. -- Warfarin, pharmacy consulted for dosing/monitoring -- Continue  Lovenox bridge -- INR daily; goal 2-3; remains subtherapeutic today 1.2  Seizure Patient presenting to ED after sustaining seizure.  Etiology likely poor compliance with home AEDs. -- Keppra 500 mg p.o. twice daily -- Seizure precautions  Per [facility] statutes, patients with seizures are not allowed to drive until they have been seizure-free for six months and cleared by a physician. Use caution when using heavy equipment or power tools. Avoid working on ladders or at heights. Take showers instead of baths. Ensure the water temperature is not too high on the home water heater. Do not go swimming alone. Do not lock yourself in a room alone (i.e. bathroom). When caring for infants or small children, sit down when holding, feeding, or changing them to minimize risk of injury to the child in the event you have a seizure. Maintain good sleep hygiene. Avoid alcohol.   Compressive cervical myelopathy MR C-spine with C5-6 herniated disc, spinal stenosis and cervical myopathy.  Seen by neurosurgery, Dr. Lovell Sheehan on 01/23/2023 with recommendation of a C5-6 anterior cervicectomy fusion and plating; plans for outpatient follow-up in 2 weeks.  Acute hypoxic respiratory failure Aspiration pneumonitis Patient likely suffered aspiration event following seizure.  Completed 6-day course of IV antibiotics.  ESRD on HD MWF -- Nephrology following, appreciate assistance  Shock secondary to sepsis versus sedation induced Required transient use of vasopressors during mechanical ventilation, now resolved.  Completed 6-day course of IV antibiotics.  Metabolic encephalopathy Etiology likely secondary to seizure activity and hypoxia.   -- Delirium precautions  SLE Anti-Phospholipid syndrome Currently not followed by rheumatology.  Last seen in 2022.  On anticoagulation as above.  Needs to reestablish care on discharge.  Thrombocytopenia, chronic Platelet count 116, stable.  Anemia of chronic  medical/renal disease Hemoglobin 12.8, stable.  Essential hypertension -- Discontinue carvedilol given hypotension -- Continue to monitor BP off of antihypertensives  Hyperlipidemia -- Atorvastatin 40 mg p.o. daily  Weakness/debility/deconditioning: -- Pending insurance authorization for SNF placement  DVT prophylaxis: SCDs Start: 01/19/23 0022 warfarin (COUMADIN) tablet 7.5 mg    Code Status: Full Code Family Communication: No family present at bedside  Disposition Plan:  Level of care: Med-Surg Status is: Inpatient Remains inpatient appropriate because: Medically stable for discharge to SNF once bed available and receives insurance authorization    Consultants:  Neurology PCCM Nephrology  Procedures:  Intubation 8/2 Extubation 8/5 EEG  Antimicrobials:  Vancomycin 8/3 - 8/3 Metronidazole 8/3 - 8/3 Cefepime/30-8/3 Unasyn 8/2 - 8/7   Subjective: Patient seen examined at bedside, sleeping but arousable.  No specific complaints this morning, answering questions with yes/no.  INR remains subtherapeutic.  Worked with PT yesterday, although ambulated 350 feet, definitely displayed cognitive dysfunction and balance difficulties during session.  Denies headache, no dizziness, no chest pain, no shortness of breath, no abdominal pain.  No acute events overnight per nursing staff.  Objective: Vitals:   01/31/23 0234 01/31/23 0547 01/31/23 0600 01/31/23 0733  BP: 100/73 (!) 86/63  (!) 89/71  Pulse: 76 71  69  Resp: 18   18  Temp: (!) 97.5 F (36.4 C)   97.6 F (36.4 C)  TempSrc: Oral   Oral  SpO2: 96% 100%  98%  Weight:   104.2 kg   Height:        Intake/Output Summary (Last 24 hours) at 01/31/2023 1610 Last data filed at 01/30/2023 1223 Gross per 24 hour  Intake --  Output 1400 ml  Net -1400 ml   Filed Weights   01/30/23 0757 01/30/23 1228 01/31/23 0600  Weight: 104.7 kg 103.1 kg 104.2 kg    Examination:  Physical Exam: GEN: NAD, alert and oriented x 3,  chronically ill appearance HEENT: NCAT, PERRL, EOMI, sclera clear, MMM PULM: CTAB w/o wheezes/crackles, normal respiratory effort, on room air CV: RRR w/o M/G/R GI: abd soft, NTND, NABS MSK: no peripheral edema, moves all extremities independently NEURO: Moves all extremities independently with equal strength PSYCH: normal mood/affect Integumentary: No concerning rashes/lesions/wounds noted on exposed skin surfaces.    Data Reviewed: I have personally reviewed following labs and imaging studies  CBC: Recent Labs  Lab 01/25/23 0350 01/26/23 0423 01/27/23 0351 01/28/23 0143 01/30/23 0648  WBC 7.4 9.8 8.2 9.1 7.6  NEUTROABS  --  6.4 4.7  --  4.7  HGB 11.2* 12.9* 12.4* 12.4* 12.8*  HCT 33.6* 39.3 38.2* 38.7* 39.6  MCV 94.1 94.0 95.7 93.9 94.5  PLT 106* 91* 91* 107* 116*   Basic Metabolic Panel: Recent Labs  Lab 01/25/23 0350 01/26/23 0423 01/27/23 0351 01/28/23 0143 01/30/23 0648  NA 134* 133* 134* 134* 135  K 3.9 4.2 4.1 4.7 5.2*  CL 93* 93* 93* 92* 92*  CO2 23 22 23  21* 24  GLUCOSE 91 80 94 81 86  BUN 33* 32* 50* 65* 67*  CREATININE 10.38* 9.29* 12.72* 14.61* 15.18*  CALCIUM 9.2 9.6 9.1 9.0 8.9  MG 2.1 2.1 2.3 2.5* 2.4  PHOS 6.1* 6.6* 8.7* 10.4* 10.7*   GFR: Estimated Creatinine Clearance: 8 mL/min (A) (by C-G formula based on SCr of 15.18 mg/dL (H)). Liver Function Tests: Recent Labs  Lab 01/26/23 0423 01/27/23 0351 01/30/23 0648  AST 29 22 22   ALT 25 24 19   ALKPHOS 84 87  85  BILITOT 0.6 0.6 0.7  PROT 8.9* 8.5* 8.6*  ALBUMIN 3.8 3.7 4.0   No results for input(s): "LIPASE", "AMYLASE" in the last 168 hours. No results for input(s): "AMMONIA" in the last 168 hours. Coagulation Profile: Recent Labs  Lab 01/29/23 1543 01/30/23 0648 01/31/23 0158  INR 1.1 1.2 1.2   Cardiac Enzymes: No results for input(s): "CKTOTAL", "CKMB", "CKMBINDEX", "TROPONINI" in the last 168 hours. BNP (last 3 results) No results for input(s): "PROBNP" in the last 8760  hours. HbA1C: No results for input(s): "HGBA1C" in the last 72 hours. CBG: No results for input(s): "GLUCAP" in the last 168 hours.  Lipid Profile: No results for input(s): "CHOL", "HDL", "LDLCALC", "TRIG", "CHOLHDL", "LDLDIRECT" in the last 72 hours. Thyroid Function Tests: Recent Labs    01/28/23 1353  TSH 1.230   Anemia Panel: No results for input(s): "VITAMINB12", "FOLATE", "FERRITIN", "TIBC", "IRON", "RETICCTPCT" in the last 72 hours. Sepsis Labs: No results for input(s): "PROCALCITON", "LATICACIDVEN" in the last 168 hours.  No results found for this or any previous visit (from the past 240 hour(s)).       Radiology Studies: No results found.      Scheduled Meds:  atorvastatin  40 mg Oral Daily   Chlorhexidine Gluconate Cloth  6 each Topical Q0600   cinacalcet  60 mg Oral Q supper   [START ON 02/11/2023] darbepoetin (ARANESP) injection - DIALYSIS  60 mcg Subcutaneous Q14 Days   enoxaparin (LOVENOX) injection  100 mg Subcutaneous Q24H   feeding supplement (NEPRO CARB STEADY)  237 mL Oral BID BM   levETIRAcetam  500 mg Oral BID   multivitamin  1 tablet Oral QHS   sevelamer carbonate  2,400 mg Oral TID WC   warfarin  7.5 mg Oral ONCE-1600   Warfarin - Pharmacist Dosing Inpatient   Does not apply q1600   Continuous Infusions:  sodium chloride 10 mL/hr at 01/23/23 1400     LOS: 12 days    Time spent: 52 minutes spent on chart review, discussion with nursing staff, consultants, updating family and interview/physical exam; more than 50% of that time was spent in counseling and/or coordination of care.    Alvira Philips [place], DO [facility] Available via Epic secure chat 7am-7pm After these hours, please refer to coverage provider listed on [email or web address] 01/31/2023, 9:38 AM

## 2023-02-01 DIAGNOSIS — R569 Unspecified convulsions: Secondary | ICD-10-CM | POA: Diagnosis not present

## 2023-02-01 LAB — HEPARIN ANTI-XA: Heparin LMW: 0.89 [IU]/mL

## 2023-02-01 LAB — CBC
HCT: 41.9 % (ref 39.0–52.0)
Hemoglobin: 14 g/dL (ref 13.0–17.0)
MCH: 31.7 pg (ref 26.0–34.0)
MCHC: 33.4 g/dL (ref 30.0–36.0)
MCV: 95 fL (ref 80.0–100.0)
Platelets: 111 10*3/uL — ABNORMAL LOW (ref 150–400)
RBC: 4.41 MIL/uL (ref 4.22–5.81)
RDW: 15.5 % (ref 11.5–15.5)
WBC: 6.3 10*3/uL (ref 4.0–10.5)
nRBC: 0 % (ref 0.0–0.2)

## 2023-02-01 LAB — RENAL FUNCTION PANEL
Albumin: 3.9 g/dL (ref 3.5–5.0)
Anion gap: 19 — ABNORMAL HIGH (ref 5–15)
BUN: 65 mg/dL — ABNORMAL HIGH (ref 6–20)
CO2: 24 mmol/L (ref 22–32)
Calcium: 8.7 mg/dL — ABNORMAL LOW (ref 8.9–10.3)
Chloride: 94 mmol/L — ABNORMAL LOW (ref 98–111)
Creatinine, Ser: 14.64 mg/dL — ABNORMAL HIGH (ref 0.61–1.24)
GFR, Estimated: 4 mL/min — ABNORMAL LOW (ref >60)
Glucose, Bld: 115 mg/dL — ABNORMAL HIGH (ref 70–99)
Phosphorus: 9.5 mg/dL — ABNORMAL HIGH (ref 2.5–4.6)
Potassium: 4 mmol/L (ref 3.5–5.1)
Sodium: 137 mmol/L (ref 135–145)

## 2023-02-01 LAB — PROTIME-INR
INR: 1.8 — ABNORMAL HIGH (ref 0.8–1.2)
Prothrombin Time: 20.7 s — ABNORMAL HIGH (ref 11.4–15.2)

## 2023-02-01 LAB — GLUCOSE, CAPILLARY: Glucose-Capillary: 354 mg/dL — ABNORMAL HIGH (ref 70–99)

## 2023-02-01 MED ORDER — WARFARIN SODIUM 5 MG PO TABS
5.0000 mg | ORAL_TABLET | Freq: Once | ORAL | Status: AC
  Administered 2023-02-01: 5 mg via ORAL
  Filled 2023-02-01: qty 1

## 2023-02-01 MED ORDER — HEPARIN SODIUM (PORCINE) 1000 UNIT/ML IJ SOLN
INTRAMUSCULAR | Status: AC
  Filled 2023-02-01: qty 4

## 2023-02-01 NOTE — Progress Notes (Signed)
SLP Cancellation Note  Patient Details Name: [name] [name] MRN: [id number] DOB: 05-Jul-1980   Cancelled treatment:       Reason Eval/Treat Not Completed: Patient at procedure or test/unavailable (Pt OTF for HD.)  Clyde Canterbury, M.S., CCC-SLP Speech-Language Pathologist Secure Chat Preferred  O: [phone]  Woodroe Chen 02/01/2023, 9:31 AM

## 2023-02-01 NOTE — Progress Notes (Addendum)
PROGRESS NOTE    [name] [name]  ZOX:[id number] DOB: 1980/12/09 DOA: 01/18/2023 PCP: No primary care provider on file.    Brief Narrative:   [name] [name] is a 42 y.o. 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  Suspected aspiration event.  He required intubation.  Significant Events: 8/2 admitted after seizure with resultant hypoxic respiratory failure -intubation 8/3 HD initiated for volume overload -stopped due to hypotension 8/3 EEG noted diffuse slowing but no seizure or epileptogenicity 8/5 extubated -found to have right arm weakness/unable to lift right arm above shoulder 8/6 TRH assumed care - transferred to Med/Surg bed  8/10 MRI with acute stroke  8/13 pending SNF placement  Assessment & Plan:   Acute CVA MRI brain with acute infarct left PCA territory, multiple small foci of acute ischemia left cerebellar hemisphere, single focus of acute ischemia right cerebellum.  MRA head with no acute findings.  Carotid ultrasound with 1-39% stenosis bilaterally.  TTE with LVEF 55-60%, no regional wall motion normalities, severe LV H, mildly reduced RV systolic function, mild/moderate MV regurgitation.  LDL 105, hemoglobin A1c 5.2.  Etiology likely related to hypercoagulable disorder from SLE/antiphospholipid antibody versus cardiogenic embolism.  Neurology was consulted and followed during hospital course.  Recommending switching Eliquis to to Lovenox, although per pharmacy not a great recommendation given his ESRD status.  Will continue Lovenox bridge and initiated on warfarin; with goal INR 2-3.Marland Kitchen  Recommend to avoid driving until peripheral vision loss improves. -- Warfarin, pharmacy consulted for dosing/monitoring -- Continue  Lovenox bridge -- INR daily; goal 2-3; labs pending today  Seizure Patient presenting to ED after sustaining seizure.  Etiology likely poor compliance with home AEDs. -- Keppra 500 mg p.o. twice daily -- Seizure precautions  Per [facility] statutes, patients with seizures are not allowed to drive until they have been seizure-free for six months and cleared by a physician. Use caution when using heavy equipment or power tools. Avoid working on ladders or at heights. Take showers instead of baths. Ensure the water temperature is not too high on the home water heater. Do not go swimming alone. Do not lock yourself in a room alone (i.e. bathroom). When caring for infants or small children, sit down when holding, feeding, or changing them to minimize risk of injury to the child in the event you have a seizure. Maintain good sleep hygiene. Avoid alcohol.   Compressive cervical myelopathy MR C-spine with C5-6 herniated disc, spinal stenosis and cervical myopathy.  Seen by neurosurgery, Dr. Lovell Sheehan on 01/23/2023 with recommendation of a C5-6 anterior cervicectomy fusion and plating; plans for outpatient follow-up in 2 weeks.  Acute hypoxic respiratory failure Aspiration pneumonitis Patient likely suffered aspiration event following seizure.  Completed 6-day course of IV antibiotics.  ESRD on HD MWF -- Nephrology following, appreciate assistance  Shock secondary to sepsis versus sedation induced Required transient use of vasopressors during mechanical ventilation, now resolved.  Completed 6-day course of IV antibiotics.  Metabolic encephalopathy Etiology likely secondary to seizure activity and hypoxia.   -- Delirium precautions  SLE Anti-Phospholipid syndrome Currently not followed by rheumatology.  Last seen in 2022.  On anticoagulation as above.  Needs to reestablish care on discharge.  Thrombocytopenia, chronic Platelet count 116, stable.  Anemia of chronic medical/renal  disease Hemoglobin 12.8, stable.  Essential hypertension -- Discontinue carvedilol given hypotension -- Continue to monitor BP off of antihypertensives  Hyperlipidemia -- Atorvastatin 40 mg p.o. daily  Weakness/debility/deconditioning: -- Pending insurance authorization for SNF placement  DVT prophylaxis: SCDs Start: 01/19/23 0022    Code Status: Full Code Family Communication: No family present at bedside  Disposition Plan:  Level of care: Med-Surg Status is: Inpatient Remains inpatient appropriate because: Medically stable for discharge to SNF once bed available and receives insurance authorization    Consultants:  Neurology PCCM Nephrology  Procedures:  Intubation 8/2 Extubation 8/5 EEG  Antimicrobials:  Vancomycin 8/3 - 8/3 Metronidazole 8/3 - 8/3 Cefepime/30-8/3 Unasyn 8/2 - 8/7   Subjective: Patient seen examined at bedside, lying in bed.  No specific complaints this morning.  INR remains subtherapeutic.  Going for HD today.  Discussed with social work, has bed offers and will discuss with patient's mother today.  Denies headache, no dizziness, no chest pain, no shortness of breath, no abdominal pain.  No acute events overnight per nursing staff.  Objective: Vitals:   02/01/23 0806 02/01/23 0918 02/01/23 0921 02/01/23 0930  BP: 91/73 116/81 120/88 120/88  Pulse: 61  66 73  Resp:  12 12 12   Temp:      TempSrc:      SpO2: 99% 93% 100% 97%  Weight:      Height:        Intake/Output Summary (Last 24 hours) at 02/01/2023 0944 Last data filed at 02/01/2023 0853 Gross per 24 hour  Intake 80 ml  Output --  Net 80 ml   Filed Weights   01/30/23 1228 01/31/23 0600 02/01/23 0327  Weight: 103.1 kg 104.2 kg 104.2 kg    Examination:  Physical Exam: GEN: NAD, alert, chronically ill appearance HEENT: NCAT, PERRL, EOMI, sclera clear, MMM PULM: CTAB w/o wheezes/crackles, normal respiratory effort, on room air CV: RRR w/o M/G/R GI: abd soft, NTND,  NABS MSK: no peripheral edema, moves all extremities independently NEURO: Moves all extremities independently with equal strength PSYCH: normal mood/affect Integumentary: No concerning rashes/lesions/wounds noted on exposed skin surfaces.    Data Reviewed: I have personally reviewed following labs and imaging studies  CBC: Recent Labs  Lab 01/26/23 0423 01/27/23 0351 01/28/23 0143 01/30/23 0648  WBC 9.8 8.2 9.1 7.6  NEUTROABS 6.4 4.7  --  4.7  HGB 12.9* 12.4* 12.4* 12.8*  HCT 39.3 38.2* 38.7* 39.6  MCV 94.0 95.7 93.9 94.5  PLT 91* 91* 107* 116*   Basic Metabolic Panel: Recent Labs  Lab 01/26/23 0423 01/27/23 0351 01/28/23 0143 01/30/23 0648  NA 133* 134* 134* 135  K 4.2 4.1 4.7 5.2*  CL 93* 93* 92* 92*  CO2 22 23 21* 24  GLUCOSE 80 94 81 86  BUN 32* 50* 65* 67*  CREATININE 9.29* 12.72* 14.61* 15.18*  CALCIUM 9.6 9.1 9.0 8.9  MG 2.1 2.3 2.5* 2.4  PHOS 6.6* 8.7* 10.4* 10.7*   GFR: Estimated Creatinine Clearance: 8 mL/min (A) (by C-G formula based on SCr of 15.18 mg/dL (H)). Liver Function Tests: Recent Labs  Lab 01/26/23 0423 01/27/23 0351 01/30/23 0648  AST 29 22 22   ALT 25 24 19   ALKPHOS 84 87 85  BILITOT 0.6 0.6 0.7  PROT 8.9* 8.5* 8.6*  ALBUMIN 3.8 3.7 4.0   No results for input(s): "LIPASE", "AMYLASE" in the last 168 hours. No results for input(s): "AMMONIA" in the last 168 hours. Coagulation Profile: Recent Labs  Lab 01/29/23 1543 01/30/23 0648 01/31/23 0158  INR 1.1 1.2 1.2   Cardiac Enzymes: No results for input(s): "CKTOTAL", "CKMB", "CKMBINDEX", "TROPONINI" in the last 168 hours. BNP (last 3 results) No results for input(s): "PROBNP" in the last 8760 hours. HbA1C: No results for input(s): "HGBA1C" in the last 72 hours. CBG: No results for input(s): "GLUCAP" in the last 168 hours.  Lipid Profile: No results for input(s): "CHOL", "HDL", "LDLCALC", "TRIG", "CHOLHDL", "LDLDIRECT" in the last 72 hours. Thyroid Function Tests: No  results for input(s): "TSH", "T4TOTAL", "FREET4", "T3FREE", "THYROIDAB" in the last 72 hours.  Anemia Panel: No results for input(s): "VITAMINB12", "FOLATE", "FERRITIN", "TIBC", "IRON", "RETICCTPCT" in the last 72 hours. Sepsis Labs: No results for input(s): "PROCALCITON", "LATICACIDVEN" in the last 168 hours.  No results found for this or any previous visit (from the past 240 hour(s)).       Radiology Studies: No results found.      Scheduled Meds:  atorvastatin  40 mg Oral Daily   Chlorhexidine Gluconate Cloth  6 each Topical Q0600   cinacalcet  60 mg Oral Q supper   [START ON 02/11/2023] darbepoetin (ARANESP) injection - DIALYSIS  60 mcg Subcutaneous Q14 Days   enoxaparin (LOVENOX) injection  100 mg Subcutaneous Q24H   feeding supplement (NEPRO CARB STEADY)  237 mL Oral BID BM   levETIRAcetam  500 mg Oral BID   multivitamin  1 tablet Oral QHS   sevelamer carbonate  2,400 mg Oral TID WC   Warfarin - Pharmacist Dosing Inpatient   Does not apply q1600   Continuous Infusions:  sodium chloride 10 mL/hr at 01/23/23 1400     LOS: 13 days    Time spent: 50 minutes spent on chart review, discussion with nursing staff, consultants, updating family and interview/physical exam; more than 50% of that time was spent in counseling and/or coordination of care.    Alvira Philips [place], DO [facility] Available via Epic secure chat 7am-7pm After these hours, please refer to coverage provider listed on [email or web address] 02/01/2023, 9:44 AM

## 2023-02-01 NOTE — TOC Progression Note (Addendum)
Transition of Care [facility]) - Progression Note    Patient Details  Name: [name] [name] MRN: [id number] Date of Birth: 06/30/1980  [facility] CM/SW Contact  Cortney Beissel A [place], [place] Phone Number: 02/01/2023, 2:50 PM  Clinical Narrative:     Update 1630 CSW followed up with pt regarding SNF bed choice. CSW informed him that [facility] does not do transportation for SNF. He said [facility] was his second choice. CSW notified facility. They are able to assist with transport to outpatient HD.     CSW followed up with pt at bedside. He was unable to find phone contact for his mother to reach out to to assist with bed offers for SNF. But he was agreeable to SNF placement and able to decide with CSW for [facility] as was less confused during conversation.   CSW to start auth for [facility]. Facility notified.   TOC will continue to follow.   Expected Discharge Plan: OP Rehab Barriers to Discharge: No Barriers Identified  Expected Discharge Plan and Services   Discharge Planning Services: CM Consult Post Acute Care Choice: Durable Medical Equipment, Resumption of Svcs/PTA Provider Living arrangements for the past 2 months: Hotel/Motel                 DME Arranged: Walker rolling DME Agency: [employer] Date DME Agency Contacted: 01/25/23 Time DME Agency Contacted: 1539 Representative spoke with at DME Agency: Vaughan Basta, CM with [employer]             Social Determinants of Health (SDOH) Interventions    Readmission Risk Interventions    01/25/2023    3:40 PM  Readmission Risk Prevention Plan  Post Dischage Appt Complete  Medication Screening Complete  Transportation Screening Complete

## 2023-02-01 NOTE — Procedures (Signed)
I was present at this dialysis session. I have reviewed the session itself and made appropriate changes.   Filed Weights   01/30/23 1228 01/31/23 0600 02/01/23 0327  Weight: 103.1 kg 104.2 kg 104.2 kg    Recent Labs  Lab 02/01/23 1010  NA 137  K 4.0  CL 94*  CO2 24  GLUCOSE 115*  [name] 65*  CREATININE 14.64*  CALCIUM 8.7*  PHOS 9.5*    Recent Labs  Lab 01/26/23 0423 01/27/23 0351 01/28/23 0143 01/30/23 0648 02/01/23 0930  WBC 9.8 8.2 9.1 7.6 6.3  NEUTROABS 6.4 4.7  --  4.7  --   HGB 12.9* 12.4* 12.4* 12.8* 14.0  HCT 39.3 38.2* 38.7* 39.6 41.9  MCV 94.0 95.7 93.9 94.5 95.0  PLT 91* 91* 107* 116* 111*    Scheduled Meds:  atorvastatin  40 mg Oral Daily   Chlorhexidine Gluconate Cloth  6 each Topical Q0600   cinacalcet  60 mg Oral Q supper   enoxaparin (LOVENOX) injection  100 mg Subcutaneous Q24H   feeding supplement (NEPRO CARB STEADY)  237 mL Oral BID BM   heparin sodium (porcine)       levETIRAcetam  500 mg Oral BID   multivitamin  1 tablet Oral QHS   sevelamer carbonate  2,400 mg Oral TID WC   Warfarin - Pharmacist Dosing Inpatient   Does not apply q1600   Continuous Infusions:  sodium chloride 10 mL/hr at 01/23/23 1400   PRN Meds:.acetaminophen, diphenhydrAMINE, docusate sodium, heparin sodium (porcine), LORazepam, ondansetron (ZOFRAN) IV, mouth rinse, polyethylene glycol   [name] Bun,  MD 02/01/2023, 12:08 PM

## 2023-02-01 NOTE — Procedures (Signed)
HD Note:  Some information was entered later than the data was gathered due to patient care needs. The stated time with the data is accurate.  Received patient in bed to unit.   Alert and oriented.   Informed consent signed and in chart.   Patient weighed with standing scale and was at his dry weight, treatment UF goal is keep even.  TX duration:  Patient SBP dropped to 83 during treatment.  Patient was difficult to awaken. Bolus given.  Patient SBP responded well.  Patient then began cramping on right lower flank.  Another bolus was given. Patient vomited partially digested food.  Patient felt relieved of discomfort post emesis.  Access used: right upper chest HD catheter Access issues: None  Hand off given to oncoming dialysis nurse  Alek Borges L. Dareen Piano, RN [facility] D[facility] Unit.

## 2023-02-01 NOTE — Progress Notes (Signed)
  [facility] Progress Note   Subjective:   Patient feels well with no complaints today  Objective Vitals:   02/01/23 1025 02/01/23 1031 02/01/23 1058 02/01/23 1100  BP: (!) 83/70 97/63 (!) 112/94 100/80  Pulse: 75 74 77 75  Resp: 13 13 13 12   Temp:      TempSrc:      SpO2: 94% 90% 95%   Weight:      Height:       Physical Exam General: Awake and alert, lying in bed Heart: Normal rate, no rub Lungs: Bilateral chest with no increased work of breathing Abdomen: soft nondistended Extremities: trace edema, warm and well-perfused Dialysis Access: [facility] c/d/i    Dialysis Orders: [facility] MWF 4h   400/800 112.8kg  2/2 bath  TDC  Heparin none - Mircera 120 mcg IV q 2 weeks (last dose 01/16/2023) - Venofer 50 mg IV q week   Assessment/Plan: Seizures: Had not been taking OP meds. Found down at [employer].  Now improved Acute CVA - per MRI done 8/10 w/ cerebellar CVA w/o hemorrhage.  Recommendation was to switch Eliquis to warfarin.  In the process.  Mgmt per neuro and primary Acute hypoxic respiratory failure: pulm edema + possible aspiration PNA. Rx'd w/ HD and IV abx. Resolved.   Volume - under dry wt. Repeat CXR 8/10 showed resolution of edema. Challenging weight and down as low as 103. Adjust EDW at DC as needed  BP - BP's are labile with some in the 90's. Stopped norvasc; carvedilol with hold parameters  ESRD -  MWF HD. Continue schedule  Anemia  - Hb dropped early this admit into 7's, but with volume removal and esa Rx, hemoglobin now 14.  Stop ESA entirely.  Metabolic bone disease -  Corrected Ca+ 10 PO4 elevated. Started renvela as binder and titrating prn and resume sensipar 60mg  every day.   Nutrition - push protein    02/01/2023, 11:13 AM  Recent Labs  Lab 01/27/23 0351 01/28/23 0143 01/30/23 0648 02/01/23 0930  HGB 12.4* 12.4* 12.8* 14.0  ALBUMIN 3.7  --  4.0  --   CALCIUM 9.1 9.0 8.9  --   PHOS 8.7* 10.4* 10.7*  --   CREATININE 12.72*  14.61* 15.18*  --   K 4.1 4.7 5.2*  --     Inpatient medications:  atorvastatin  40 mg Oral Daily   Chlorhexidine Gluconate Cloth  6 each Topical Q0600   cinacalcet  60 mg Oral Q supper   enoxaparin (LOVENOX) injection  100 mg Subcutaneous Q24H   feeding supplement (NEPRO CARB STEADY)  237 mL Oral BID BM   levETIRAcetam  500 mg Oral BID   multivitamin  1 tablet Oral QHS   sevelamer carbonate  2,400 mg Oral TID WC   Warfarin - Pharmacist Dosing Inpatient   Does not apply q1600    sodium chloride 10 mL/hr at 01/23/23 1400   acetaminophen, diphenhydrAMINE, docusate sodium, LORazepam, ondansetron (ZOFRAN) IV, mouth rinse, polyethylene glycol
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
## 2023-02-01 NOTE — Plan of Care (Signed)
 Problem: Education: Goal: Ability to describe self-care measures that may prevent or decrease complications (Diabetes Survival Skills Education) will improve Outcome: Progressing Goal: Individualized Educational Video(s) Outcome: Progressing   Problem: Coping: Goal: Ability to adjust to condition or change in health will improve Outcome: Progressing   Problem: Fluid Volume: Goal: Ability to maintain a balanced intake and output will improve Outcome: Progressing   Problem: Health Behavior/Discharge Planning: Goal: Ability to identify and utilize available resources and services will improve Outcome: Progressing Goal: Ability to manage health-related needs will improve Outcome: Progressing   Problem: Metabolic: Goal: Ability to maintain appropriate glucose levels will improve Outcome: Progressing   Problem: Nutritional: Goal: Maintenance of adequate nutrition will improve Outcome: Progressing Goal: Progress toward achieving an optimal weight will improve Outcome: Progressing   Problem: Skin Integrity: Goal: Risk for impaired skin integrity will decrease Outcome: Progressing   Problem: Tissue Perfusion: Goal: Adequacy of tissue perfusion will improve Outcome: Progressing   Problem: Education: Goal: Knowledge of General Education information will improve Description: Including pain rating scale, medication(s)/side effects and non-pharmacologic comfort measures Outcome: Progressing   Problem: Health Behavior/Discharge Planning: Goal: Ability to manage health-related needs will improve Outcome: Progressing   Problem: Clinical Measurements: Goal: Ability to maintain clinical measurements within normal limits will improve Outcome: Progressing Goal: Will remain free from infection Outcome: Progressing Goal: Diagnostic test results will improve Outcome: Progressing Goal: Respiratory complications will improve Outcome: Progressing Goal: Cardiovascular complication will  be avoided Outcome: Progressing   Problem: Activity: Goal: Risk for activity intolerance will decrease Outcome: Progressing   Problem: Nutrition: Goal: Adequate nutrition will be maintained Outcome: Progressing   Problem: Coping: Goal: Level of anxiety will decrease Outcome: Progressing   Problem: Elimination: Goal: Will not experience complications related to bowel motility Outcome: Progressing Goal: Will not experience complications related to urinary retention Outcome: Progressing   Problem: Pain Managment: Goal: General experience of comfort will improve Outcome: Progressing   Problem: Safety: Goal: Ability to remain free from injury will improve Outcome: Progressing   Problem: Skin Integrity: Goal: Risk for impaired skin integrity will decrease Outcome: Progressing   Problem: Education: Goal: Knowledge of disease or condition will improve Outcome: Progressing Goal: Knowledge of secondary prevention will improve (MUST DOCUMENT ALL) Outcome: Progressing Goal: Knowledge of patient specific risk factors will improve Loraine Leriche N/A or DELETE if not current risk factor) Outcome: Progressing   Problem: Ischemic Stroke/TIA Tissue Perfusion: Goal: Complications of ischemic stroke/TIA will be minimized Outcome: Progressing   Problem: Coping: Goal: Will verbalize positive feelings about self Outcome: Progressing Goal: Will identify appropriate support needs Outcome: Progressing   Problem: Health Behavior/Discharge Planning: Goal: Ability to manage health-related needs will improve Outcome: Progressing Goal: Goals will be collaboratively established with patient/family Outcome: Progressing   Problem: Self-Care: Goal: Ability to participate in self-care as condition permits will improve Outcome: Progressing Goal: Verbalization of feelings and concerns over difficulty with self-care will improve Outcome: Progressing Goal: Ability to communicate needs accurately will  improve Outcome: Progressing   Problem: Nutrition: Goal: Risk of aspiration will decrease Outcome: Progressing Goal: Dietary intake will improve Outcome: Progressing   Problem: Education: Goal: Knowledge of disease or condition will improve Outcome: Progressing Goal: Knowledge of secondary prevention will improve (MUST DOCUMENT ALL) Outcome: Progressing Goal: Knowledge of patient specific risk factors will improve Loraine Leriche N/A or DELETE if not current risk factor) Outcome: Progressing   Problem: Ischemic Stroke/TIA Tissue Perfusion: Goal: Complications of ischemic stroke/TIA will be minimized Outcome: Progressing   Problem: Coping: Goal: Will  verbalize positive feelings about self Outcome: Progressing Goal: Will identify appropriate support needs Outcome: Progressing   Problem: Health Behavior/Discharge Planning: Goal: Ability to manage health-related needs will improve Outcome: Progressing Goal: Goals will be collaboratively established with patient/family Outcome: Progressing

## 2023-02-01 NOTE — Plan of Care (Signed)
  Problem: Education: Goal: Ability to describe self-care measures that may prevent or decrease complications (Diabetes Survival Skills Education) will improve Outcome: Progressing Goal: Individualized Educational Video(s) Outcome: Progressing   Problem: Coping: Goal: Ability to adjust to condition or change in health will improve Outcome: Progressing   Problem: Fluid Volume: Goal: Ability to maintain a balanced intake and output will improve Outcome: Progressing   Problem: Health Behavior/Discharge Planning: Goal: Ability to identify and utilize available resources and services will improve Outcome: Progressing Goal: Ability to manage health-related needs will improve Outcome: Progressing   Problem: Metabolic: Goal: Ability to maintain appropriate glucose levels will improve Outcome: Progressing   Problem: Nutritional: Goal: Maintenance of adequate nutrition will improve Outcome: Progressing Goal: Progress toward achieving an optimal weight will improve Outcome: Progressing   Problem: Skin Integrity: Goal: Risk for impaired skin integrity will decrease Outcome: Progressing   Problem: Tissue Perfusion: Goal: Adequacy of tissue perfusion will improve Outcome: Progressing   Problem: Education: Goal: Knowledge of General Education information will improve Description: Including pain rating scale, medication(s)/side effects and non-pharmacologic comfort measures Outcome: Progressing   Problem: Health Behavior/Discharge Planning: Goal: Ability to manage health-related needs will improve Outcome: Progressing   Problem: Clinical Measurements: Goal: Ability to maintain clinical measurements within normal limits will improve Outcome: Progressing Goal: Will remain free from infection Outcome: Progressing Goal: Diagnostic test results will improve Outcome: Progressing Goal: Respiratory complications will improve Outcome: Progressing Goal: Cardiovascular complication will  be avoided Outcome: Progressing   Problem: Activity: Goal: Risk for activity intolerance will decrease Outcome: Progressing   Problem: Nutrition: Goal: Adequate nutrition will be maintained Outcome: Progressing   Problem: Coping: Goal: Level of anxiety will decrease Outcome: Progressing   Problem: Elimination: Goal: Will not experience complications related to bowel motility Outcome: Progressing Goal: Will not experience complications related to urinary retention Outcome: Progressing   Problem: Pain Managment: Goal: General experience of comfort will improve Outcome: Progressing   Problem: Safety: Goal: Ability to remain free from injury will improve Outcome: Progressing   Problem: Skin Integrity: Goal: Risk for impaired skin integrity will decrease Outcome: Progressing   Problem: Education: Goal: Knowledge of disease or condition will improve Outcome: Progressing Goal: Knowledge of secondary prevention will improve (MUST DOCUMENT ALL) Outcome: Progressing Goal: Knowledge of patient specific risk factors will improve (Mark N/A or DELETE if not current risk factor) Outcome: Progressing   Problem: Ischemic Stroke/TIA Tissue Perfusion: Goal: Complications of ischemic stroke/TIA will be minimized Outcome: Progressing   Problem: Coping: Goal: Will verbalize positive feelings about self Outcome: Progressing Goal: Will identify appropriate support needs Outcome: Progressing   Problem: Health Behavior/Discharge Planning: Goal: Ability to manage health-related needs will improve Outcome: Progressing Goal: Goals will be collaboratively established with patient/family Outcome: Progressing   Problem: Self-Care: Goal: Ability to participate in self-care as condition permits will improve Outcome: Progressing Goal: Verbalization of feelings and concerns over difficulty with self-care will improve Outcome: Progressing Goal: Ability to communicate needs accurately will  improve Outcome: Progressing   Problem: Nutrition: Goal: Risk of aspiration will decrease Outcome: Progressing Goal: Dietary intake will improve Outcome: Progressing   

## 2023-02-01 NOTE — Progress Notes (Signed)
ANTICOAGULATION CONSULT NOTE - Follow Up Consult  Pharmacy Consult for Warfarin and Enoxaparin Indication:  antiphospholipid syndrome  Allergies  Allergen Reactions   Cetirizine & Related Swelling    Patient Measurements: Height: 6\' 1"  (185.4 cm) Weight: 102.1 kg (225 lb 1.4 oz) (standing) IBW/kg (Calculated) : 79.9 Enoxparin Dosing Weight: 102 kg  Vital Signs: Temp: 97.6 F (36.4 C) (08/16 1303) Temp Source: Oral (08/16 1303) BP: 97/72 (08/16 1313) Pulse Rate: 66 (08/16 1303)  Labs: Recent Labs    01/30/23 0648 01/31/23 0158 02/01/23 0854 02/01/23 0930 02/01/23 1010  HGB 12.8*  --   --  14.0  --   HCT 39.6  --   --  41.9  --   PLT 116*  --   --  111*  --   LABPROT 15.8* 15.5* 20.7*  --   --   INR 1.2 1.2 1.8*  --   --   HEPRLOWMOCWT  --   --   --  0.89  --   CREATININE 15.18*  --   --   --  14.64*   ESRD  Assessment: 41yoM with hx of lupus anticoagulant/antiphospholipid syndrome was supposed to be on apixaban prior to admission, but last picked up Dec 2023 for 90 day supply. Pharmacy consulted to restart apixaban on 01/27/23.   Apixaban 5 mg given x 2 on 8/11 and ~2pm on 01/28/23. Pharmacy consulted to transition to full-dose Enoxaparin.  ESRD with usual MWF HD. Discussed with Drs. Lowell Guitar and Pearlean Brownie, who are aware of increased risk of bleeding with Enoxaparin and ESRD due to potential accumulation of active heparin metabolites that are undetected by anti-Xa levels. They would like to proceed with Enoxaparin for now.     Decision made to change long-term anticoagulation to warfarin on 8/13. Bridging with Lovenox. Chronic thrombocytopenia, stable. Hemoglobin trended up to 14.0 today. Not currently on any interacting medications.  INR up to 1.8 after warfarin 7.5 mg daily x 3 doses. Anti-Xa level therapeutic (0.89) ~4 hours after 4th daily Enoxaparin dose.  Goal of Therapy:  INR 2-3 Anti-Xa level 0.6-1 units/ml 4hrs after LMWH dose given Monitor platelets by  anticoagulation protocol: Yes   Plan:  Warfarin 5 mg x 1 today. Enoxaparin 100 mg SQ q24h until INR therapeutic. Daily PT/INR.  CBC at least q72hrs while on Enoxaparin. Timing w/ HD labs. Monitor for signs/symptoms of bleeding.  Dennie Fetters, RPh 02/01/2023,2:51 PM

## 2023-02-02 DIAGNOSIS — R569 Unspecified convulsions: Secondary | ICD-10-CM | POA: Diagnosis not present

## 2023-02-02 LAB — PROTIME-INR
INR: 1.8 — ABNORMAL HIGH (ref 0.8–1.2)
Prothrombin Time: 21.1 s — ABNORMAL HIGH (ref 11.4–15.2)

## 2023-02-02 MED ORDER — WARFARIN SODIUM 7.5 MG PO TABS
7.5000 mg | ORAL_TABLET | Freq: Once | ORAL | Status: AC
  Administered 2023-02-02: 7.5 mg via ORAL
  Filled 2023-02-02: qty 1

## 2023-02-02 NOTE — Plan of Care (Signed)
  Problem: Education: Goal: Ability to describe self-care measures that may prevent or decrease complications (Diabetes Survival Skills Education) will improve Outcome: Progressing Goal: Individualized Educational Video(s) Outcome: Progressing   Problem: Coping: Goal: Ability to adjust to condition or change in health will improve Outcome: Progressing   Problem: Fluid Volume: Goal: Ability to maintain a balanced intake and output will improve Outcome: Progressing   Problem: Health Behavior/Discharge Planning: Goal: Ability to identify and utilize available resources and services will improve Outcome: Progressing Goal: Ability to manage health-related needs will improve Outcome: Progressing   Problem: Metabolic: Goal: Ability to maintain appropriate glucose levels will improve Outcome: Progressing   Problem: Nutritional: Goal: Maintenance of adequate nutrition will improve Outcome: Progressing Goal: Progress toward achieving an optimal weight will improve Outcome: Progressing   Problem: Skin Integrity: Goal: Risk for impaired skin integrity will decrease Outcome: Progressing   Problem: Tissue Perfusion: Goal: Adequacy of tissue perfusion will improve Outcome: Progressing   Problem: Education: Goal: Knowledge of General Education information will improve Description: Including pain rating scale, medication(s)/side effects and non-pharmacologic comfort measures Outcome: Progressing   Problem: Health Behavior/Discharge Planning: Goal: Ability to manage health-related needs will improve Outcome: Progressing   Problem: Clinical Measurements: Goal: Ability to maintain clinical measurements within normal limits will improve Outcome: Progressing Goal: Will remain free from infection Outcome: Progressing Goal: Diagnostic test results will improve Outcome: Progressing Goal: Respiratory complications will improve Outcome: Progressing Goal: Cardiovascular complication will  be avoided Outcome: Progressing   Problem: Activity: Goal: Risk for activity intolerance will decrease Outcome: Progressing   Problem: Nutrition: Goal: Adequate nutrition will be maintained Outcome: Progressing   Problem: Coping: Goal: Level of anxiety will decrease Outcome: Progressing   Problem: Elimination: Goal: Will not experience complications related to bowel motility Outcome: Progressing Goal: Will not experience complications related to urinary retention Outcome: Progressing   Problem: Pain Managment: Goal: General experience of comfort will improve Outcome: Progressing   Problem: Safety: Goal: Ability to remain free from injury will improve Outcome: Progressing   Problem: Skin Integrity: Goal: Risk for impaired skin integrity will decrease Outcome: Progressing   Problem: Education: Goal: Knowledge of disease or condition will improve Outcome: Progressing Goal: Knowledge of secondary prevention will improve (MUST DOCUMENT ALL) Outcome: Progressing Goal: Knowledge of patient specific risk factors will improve (Mark N/A or DELETE if not current risk factor) Outcome: Progressing   Problem: Ischemic Stroke/TIA Tissue Perfusion: Goal: Complications of ischemic stroke/TIA will be minimized Outcome: Progressing   Problem: Coping: Goal: Will verbalize positive feelings about self Outcome: Progressing Goal: Will identify appropriate support needs Outcome: Progressing   Problem: Health Behavior/Discharge Planning: Goal: Ability to manage health-related needs will improve Outcome: Progressing Goal: Goals will be collaboratively established with patient/family Outcome: Progressing   Problem: Self-Care: Goal: Ability to participate in self-care as condition permits will improve Outcome: Progressing Goal: Verbalization of feelings and concerns over difficulty with self-care will improve Outcome: Progressing Goal: Ability to communicate needs accurately will  improve Outcome: Progressing   Problem: Nutrition: Goal: Risk of aspiration will decrease Outcome: Progressing Goal: Dietary intake will improve Outcome: Progressing   

## 2023-02-02 NOTE — Progress Notes (Signed)
ANTICOAGULATION CONSULT NOTE - Follow Up Consult  Pharmacy Consult for Warfarin and Enoxaparin Indication:  antiphospholipid syndrome  Allergies  Allergen Reactions   Cetirizine & Related Swelling    Patient Measurements: Height: 6\' 1"  (185.4 cm) Weight: 102.1 kg (225 lb 1.4 oz) (standing) IBW/kg (Calculated) : 79.9 Enoxparin Dosing Weight: 102 kg  Vital Signs: Temp: 97.5 F (36.4 C) (08/17 0831) Temp Source: Oral (08/17 0831) BP: 108/68 (08/17 0831) Pulse Rate: 65 (08/17 0831)  Labs: Recent Labs    01/31/23 0158 02/01/23 0854 02/01/23 0930 02/01/23 1010 02/02/23 0649  HGB  --   --  14.0  --   --   HCT  --   --  41.9  --   --   PLT  --   --  111*  --   --   LABPROT 15.5* 20.7*  --   --  21.1*  INR 1.2 1.8*  --   --  1.8*  HEPRLOWMOCWT  --   --  0.89  --   --   CREATININE  --   --   --  14.64*  --    ESRD  Assessment: 41yoM with hx of lupus anticoagulant/antiphospholipid syndrome and was supposed to be on apixaban prior to admission, but last picked up Dec 2023 for 90 day supply. Pharmacy initially consulted to restart apixaban on 01/27/23. Pharmacy later consulted to transition to full-dose enoxaparin, last dose of apixaban 01/28/23.   ESRD with usual MWF HD. Discussed with Drs. Lowell Guitar and Pearlean Brownie, who are aware of increased risk of bleeding with Enoxaparin and ESRD due to potential accumulation of active heparin metabolites that are undetected by anti-Xa levels. They would like to proceed with Enoxaparin for now.     Decision made to change long-term anticoagulation to warfarin on 8/13. Bridging with Lovenox. Chronic thrombocytopenia, stable. Hemoglobin trended up to 14.0 on 8/16. Not currently on any interacting medications.  8/17 AM: INR remains at 1.8 today, subtherapeutic with decreased dose of warfarin to 5 mg yesterday. Lovenox anti-Xa level 0.89 ~4 hours after 4th dose of enoxaparin, therapeutic.  Goal of Therapy:  INR 2-3 Anti-Xa level 0.6-1 units/ml 4hrs  after LMWH dose given Monitor platelets by anticoagulation protocol: Yes   Plan:  Warfarin 7.5 mg x 1 today. Enoxaparin 100 mg SQ q24h until INR therapeutic. Daily PT/INR.  CBC at least q72hrs while on Enoxaparin. Timing w/ HD labs. Monitor for signs/symptoms of bleeding.  Enos Fling, PharmD PGY-1 Acute Care Pharmacy Resident 02/02/2023 8:33 AM

## 2023-02-02 NOTE — Progress Notes (Signed)
PROGRESS NOTE    [name] [name]  GEX:[id number] DOB: [birth year] DOA: 01/18/2023 PCP: No primary care provider on file.    Brief Narrative:   [name] [name] is a 42 y.o. 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  Suspected aspiration event.  He required intubation.  Significant Events: 8/2 admitted after seizure with resultant hypoxic respiratory failure -intubation 8/3 HD initiated for volume overload -stopped due to hypotension 8/3 EEG noted diffuse slowing but no seizure or epileptogenicity 8/5 extubated -found to have right arm weakness/unable to lift right arm above shoulder 8/6 [facility] assumed care - transferred to Med/Surg bed  8/10 MRI with acute stroke  8/13 pending SNF placement  Assessment & Plan:   Acute CVA MRI brain with acute infarct left PCA territory, multiple small foci of acute ischemia left cerebellar hemisphere, single focus of acute ischemia right cerebellum.  MRA head with no acute findings.  Carotid ultrasound with 1-39% stenosis bilaterally.  TTE with LVEF 55-60%, no regional wall motion normalities, severe LV H, mildly reduced RV systolic function, mild/moderate MV regurgitation.  LDL 105, hemoglobin A1c 5.2.  Etiology likely related to hypercoagulable disorder from SLE/antiphospholipid antibody versus cardiogenic embolism.  Neurology was consulted and followed during hospital course.  Recommending switching Eliquis to to Lovenox, although per pharmacy not a great recommendation given his ESRD status.  Will continue Lovenox bridge and initiated on warfarin; with goal INR 2-3.Marland Kitchen  Recommend to avoid driving until peripheral vision loss improves. -- Warfarin, pharmacy consulted for dosing/monitoring -- Continue  Lovenox bridge -- INR daily; goal 2-3; INR 1.8 today  Seizure Patient presenting to ED after sustaining seizure.  Etiology likely poor compliance with home AEDs. -- Keppra 500 mg p.o. twice daily -- Seizure precautions  Per [facility] statutes, patients with seizures are not allowed to drive until they have been seizure-free for six months and cleared by a physician. Use caution when using heavy equipment or power tools. Avoid working on ladders or at heights. Take showers instead of baths. Ensure the water temperature is not too high on the home water heater. Do not go swimming alone. Do not lock yourself in a room alone (i.e. bathroom). When caring for infants or small children, sit down when holding, feeding, or changing them to minimize risk of injury to the child in the event you have a seizure. Maintain good sleep hygiene. Avoid alcohol.   Compressive cervical myelopathy MR C-spine with C5-6 herniated disc, spinal stenosis and cervical myopathy.  Seen by neurosurgery, Dr. Lovell Sheehan on 01/23/2023 with recommendation of a C5-6 anterior cervicectomy fusion and plating; plans for outpatient follow-up in 2 weeks.  Acute hypoxic respiratory failure Aspiration pneumonitis Patient likely suffered aspiration event following seizure.  Completed 6-day course of IV antibiotics.  ESRD on HD MWF -- Nephrology following, appreciate assistance  Shock secondary to sepsis versus sedation induced Required transient use of vasopressors during mechanical ventilation, now resolved.  Completed 6-day course of IV antibiotics.  Metabolic encephalopathy Etiology likely secondary to seizure activity and hypoxia.   -- Delirium precautions  SLE Anti-Phospholipid syndrome Currently not followed by rheumatology.  Last seen in 2022.  On anticoagulation as above.  Needs to reestablish care on discharge.  Thrombocytopenia, chronic Platelet count 116, stable.  Anemia of chronic medical/renal  disease Hemoglobin 12.8, stable.  Essential hypertension -- Discontinue carvedilol given hypotension -- Continue to monitor BP off of antihypertensives  Hyperlipidemia -- Atorvastatin 40 mg p.o. daily  Weakness/debility/deconditioning: -- Pending insurance authorization for SNF placement  DVT prophylaxis: SCDs Start: 01/19/23 0022 warfarin (COUMADIN) tablet 7.5 mg    Code Status: Full Code Family Communication: No family present at bedside  Disposition Plan:  Level of care: Med-Surg Status is: Inpatient Remains inpatient appropriate because: Medically stable for discharge to SNF once bed available and receives insurance authorization    Consultants:  Neurology PCCM Nephrology  Procedures:  Intubation 8/2 Extubation 8/5 EEG  Antimicrobials:  Vancomycin 8/3 - 8/3 Metronidazole 8/3 - 8/3 Cefepime/30-8/3 Unasyn 8/2 - 8/7   Subjective: Patient seen examined at bedside, lying in bed.  Stated had nausea and vomiting during HD yesterday, currently asymptomatic.  No other specific questions or concerns at this time.  INR remains subtherapeutic, 1.8 today.  Denies headache, no chest pain, no shortness of breath, no abdominal pain, no fever, no current nausea/vomiting, no diarrhea.  No acute events overnight per nursing staff.  Pending insurance authorization for SNF placement, medically stable for discharge once bed available.    Objective: Vitals:   02/01/23 1623 02/01/23 2100 02/02/23 0625 02/02/23 0831  BP: 119/84 101/68 (!) 86/67 108/68  Pulse: 71 77 72 65  Resp: 20 18 18 20   Temp: 98.6 F (37 C) (!) 97.3 F (36.3 C) 98.3 F (36.8 C) (!) 97.5 F (36.4 C)  TempSrc: Oral Oral  Oral  SpO2: 100% 95% 98% 98%  Weight:      Height:        Intake/Output Summary (Last 24 hours) at 02/02/2023 0936 Last data filed at 02/02/2023 0854 Gross per 24 hour  Intake 80 ml  Output -200 ml  Net 280 ml   Filed Weights   02/01/23 0327 02/01/23 0900 02/01/23 1316  Weight: 104.2  kg 103 kg 102.1 kg    Examination:  Physical Exam: GEN: NAD, alert, chronically ill appearance HEENT: NCAT, PERRL, EOMI, sclera clear, MMM PULM: CTAB w/o wheezes/crackles, normal respiratory effort, on room air CV: RRR w/o M/G/R GI: abd soft, NTND, NABS MSK: no peripheral edema, moves all extremities independently NEURO: Moves all extremities independently with equal strength PSYCH: normal mood/affect Integumentary: No concerning rashes/lesions/wounds noted on exposed skin surfaces.    Data Reviewed: I have personally reviewed following labs and imaging studies  CBC: Recent Labs  Lab 01/27/23 0351 01/28/23 0143 01/30/23 0648 02/01/23 0930  WBC 8.2 9.1 7.6 6.3  NEUTROABS 4.7  --  4.7  --   HGB 12.4* 12.4* 12.8* 14.0  HCT 38.2* 38.7* 39.6 41.9  MCV 95.7 93.9 94.5 95.0  PLT 91* 107* 116* 111*   Basic Metabolic Panel: Recent Labs  Lab 01/27/23 0351 01/28/23 0143 01/30/23 0648 02/01/23 1010  NA 134* 134* 135 137  K 4.1 4.7 5.2* 4.0  CL 93* 92* 92* 94*  CO2 23 21* 24 24  GLUCOSE 94 81 86 115*  BUN 50* 65* 67* 65*  CREATININE 12.72* 14.61* 15.18* 14.64*  CALCIUM 9.1 9.0 8.9 8.7*  MG 2.3 2.5* 2.4  --   PHOS 8.7* 10.4* 10.7* 9.5*   GFR: Estimated Creatinine Clearance: 8.2 mL/min (A) (by C-G formula based on SCr of 14.64 mg/dL (H)). Liver Function Tests: Recent Labs  Lab 01/27/23 0351 01/30/23 0648 02/01/23 1010  AST 22 22  --   ALT 24 19  --  ALKPHOS 87 85  --   BILITOT 0.6 0.7  --   PROT 8.5* 8.6*  --   ALBUMIN 3.7 4.0 3.9   No results for input(s): "LIPASE", "AMYLASE" in the last 168 hours. No results for input(s): "AMMONIA" in the last 168 hours. Coagulation Profile: Recent Labs  Lab 01/29/23 1543 01/30/23 0648 01/31/23 0158 02/01/23 0854 02/02/23 0649  INR 1.1 1.2 1.2 1.8* 1.8*   Cardiac Enzymes: No results for input(s): "CKTOTAL", "CKMB", "CKMBINDEX", "TROPONINI" in the last 168 hours. BNP (last 3 results) No results for input(s):  "PROBNP" in the last 8760 hours. HbA1C: No results for input(s): "HGBA1C" in the last 72 hours. CBG: Recent Labs  Lab 02/01/23 2130  GLUCAP 354*    Lipid Profile: No results for input(s): "CHOL", "HDL", "LDLCALC", "TRIG", "CHOLHDL", "LDLDIRECT" in the last 72 hours. Thyroid Function Tests: No results for input(s): "TSH", "T4TOTAL", "FREET4", "T3FREE", "THYROIDAB" in the last 72 hours.  Anemia Panel: No results for input(s): "VITAMINB12", "FOLATE", "FERRITIN", "TIBC", "IRON", "RETICCTPCT" in the last 72 hours. Sepsis Labs: No results for input(s): "PROCALCITON", "LATICACIDVEN" in the last 168 hours.  No results found for this or any previous visit (from the past 240 hour(s)).       Radiology Studies: No results found.      Scheduled Meds:  atorvastatin  40 mg Oral Daily   Chlorhexidine Gluconate Cloth  6 each Topical Q0600   cinacalcet  60 mg Oral Q supper   enoxaparin (LOVENOX) injection  100 mg Subcutaneous Q24H   feeding supplement (NEPRO CARB STEADY)  237 mL Oral BID BM   levETIRAcetam  500 mg Oral BID   multivitamin  1 tablet Oral QHS   sevelamer carbonate  2,400 mg Oral TID WC   warfarin  7.5 mg Oral ONCE-1600   Warfarin - Pharmacist Dosing Inpatient   Does not apply q1600   Continuous Infusions:  sodium chloride 10 mL/hr at 01/23/23 1400     LOS: 14 days    Time spent: 48 minutes spent on chart review, discussion with nursing staff, consultants, updating family and interview/physical exam; more than 50% of that time was spent in counseling and/or coordination of care.    Alvira Philips [place], DO [facility] Available via Epic secure chat 7am-7pm After these hours, please refer to coverage provider listed on [email or web address] 02/02/2023, 9:36 AM

## 2023-02-02 NOTE — Progress Notes (Signed)
  [facility] Progress Note   Subjective:   Completed dialysis Friday. Resting in bed, no complaints this am. Gives "thumbs up"   Objective Vitals:   02/01/23 1623 02/01/23 2100 02/02/23 0625 02/02/23 0831  BP: 119/84 101/68 (!) 86/67 108/68  Pulse: 71 77 72 65  Resp: 20 18 18 20   Temp: 98.6 F (37 C) (!) 97.3 F (36.3 C) 98.3 F (36.8 C) (!) 97.5 F (36.4 C)  TempSrc: Oral Oral  Oral  SpO2: 100% 95% 98% 98%  Weight:      Height:       Physical Exam General: Awake and alert, lying in bed Heart: Normal rate, no rub Lungs: Bilateral chest with no increased work of breathing Abdomen: soft nondistended Extremities: trace edema, warm and well-perfused Dialysis Access: [facility] c/d/i    Dialysis Orders: [facility] MWF 4h   400/800 112.8kg  2/2 bath  TDC  Heparin none - Mircera 120 mcg IV q 2 weeks (last dose 01/16/2023) - Venofer 50 mg IV q week   Assessment/Plan: Seizures: Had not been taking OP meds. Found down at [employer].  Now improved Acute CVA - per MRI done 8/10 w/ cerebellar CVA w/o hemorrhage.  Recommendation was to switch Eliquis to warfarin.  In the process.  Mgmt per neuro and primary Acute hypoxic respiratory failure: pulm edema + possible aspiration PNA. Rx'd w/ HD and IV abx. Resolved.   Volume - under dry wt. Repeat CXR 8/10 showed resolution of edema. Challenging weight and down as low as 103. Adjust EDW at DC as needed  BP - BP's are labile with some in the 90's. Stopped norvasc; carvedilol with hold parameters  ESRD -  MWF HD. Continue schedule  Anemia  - Hb dropped early this admit into 7's, but with volume removal and esa Rx, hemoglobin now 14.  Stop ESA entirely.  Metabolic bone disease -  Corrected Ca+ 10 PO4 elevated. Started renvela as binder and titrating prn and resume sensipar 60mg  every day.   Nutrition - push protein    02/02/2023, 10:28 AM  Recent Labs  Lab 01/30/23 0648 02/01/23 0930 02/01/23 1010  HGB 12.8* 14.0  --    ALBUMIN 4.0  --  3.9  CALCIUM 8.9  --  8.7*  PHOS 10.7*  --  9.5*  CREATININE 15.18*  --  14.64*  K 5.2*  --  4.0    Inpatient medications:  atorvastatin  40 mg Oral Daily   Chlorhexidine Gluconate Cloth  6 each Topical Q0600   cinacalcet  60 mg Oral Q supper   enoxaparin (LOVENOX) injection  100 mg Subcutaneous Q24H   feeding supplement (NEPRO CARB STEADY)  237 mL Oral BID BM   levETIRAcetam  500 mg Oral BID   multivitamin  1 tablet Oral QHS   sevelamer carbonate  2,400 mg Oral TID WC   warfarin  7.5 mg Oral ONCE-1600   Warfarin - Pharmacist Dosing Inpatient   Does not apply q1600    sodium chloride 10 mL/hr at 01/23/23 1400   acetaminophen, diphenhydrAMINE, docusate sodium, LORazepam, ondansetron (ZOFRAN) IV, mouth rinse, polyethylene glycol   Tomasa Blase PA-C [facility] 02/02/2023,10:31 AM

## 2023-02-03 DIAGNOSIS — R569 Unspecified convulsions: Secondary | ICD-10-CM | POA: Diagnosis not present

## 2023-02-03 LAB — PROTIME-INR
INR: 2 — ABNORMAL HIGH (ref 0.8–1.2)
Prothrombin Time: 23.1 s — ABNORMAL HIGH (ref 11.4–15.2)

## 2023-02-03 MED ORDER — WARFARIN SODIUM 5 MG PO TABS
5.0000 mg | ORAL_TABLET | Freq: Once | ORAL | Status: AC
  Administered 2023-02-03: 5 mg via ORAL
  Filled 2023-02-03: qty 1

## 2023-02-03 NOTE — Progress Notes (Addendum)
ANTICOAGULATION CONSULT NOTE - Follow Up Consult  Pharmacy Consult for Warfarin and Enoxaparin Indication:  antiphospholipid syndrome  Allergies  Allergen Reactions   Cetirizine & Related Swelling    Patient Measurements: Height: 6\' 1"  (185.4 cm) Weight: 102 kg (224 lb 13.9 oz) IBW/kg (Calculated) : 79.9 Enoxparin Dosing Weight: 102 kg  Vital Signs: Temp: 97.5 F (36.4 C) (08/18 0550) Temp Source: Oral (08/18 0550) BP: 102/66 (08/18 0550) Pulse Rate: 47 (08/18 0550)  Labs: Recent Labs    02/01/23 0854 02/01/23 0930 02/01/23 1010 02/02/23 0649 02/03/23 0539  HGB  --  14.0  --   --   --   HCT  --  41.9  --   --   --   PLT  --  111*  --   --   --   LABPROT 20.7*  --   --  21.1* 23.1*  INR 1.8*  --   --  1.8* 2.0*  HEPRLOWMOCWT  --  0.89  --   --   --   CREATININE  --   --  14.64*  --   --    ESRD  Assessment: 41yoM with hx of lupus anticoagulant/antiphospholipid syndrome and was supposed to be on apixaban prior to admission, but last picked up Dec 2023 for 90 day supply. Pharmacy initially consulted to restart apixaban on 01/27/23. Pharmacy later consulted to transition to full-dose enoxaparin, last dose of apixaban 01/28/23.   ESRD with usual MWF HD. Discussed with Drs. Lowell Guitar and Pearlean Brownie, who are aware of increased risk of bleeding with Enoxaparin and ESRD due to potential accumulation of active heparin metabolites that are undetected by anti-Xa levels. They would like to proceed with Enoxaparin for now.     Decision made to change long-term anticoagulation to warfarin on 8/13. Bridging with Lovenox. Chronic thrombocytopenia, stable. Hemoglobin trended up to 14.0 on 8/16. Not currently on any interacting medications.  8/18 AM: INR increased to 2.0, therapeutic. Received decreased dose of 5 mg on 8/17 and increased dose of 7.5 mg on 8/18. Given INR now within goal range and high bleeding risk with Lovenox and ESRD, will discontinue Lovenox today. Discussed plan to stop  bridging pt with MD.    Goal of Therapy:  INR 2-3 Anti-Xa level 0.6-1 units/ml 4hrs after LMWH dose given Monitor platelets by anticoagulation protocol: Yes   Plan:  Warfarin 5 mg x 1 today. Daily PT/INR CBC at least q72hrs. Timing w/ HD labs. Monitor for signs/symptoms of bleeding.  Enos Fling, PharmD PGY-1 Acute Care Pharmacy Resident 02/03/2023 8:02 AM

## 2023-02-03 NOTE — Progress Notes (Signed)
PROGRESS NOTE    [name] [name] DOB: [birth year] DOA: 01/18/2023 PCP: No primary care provider on file.    Brief Narrative:   [name] [name] is a 42 y.o. 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  Suspected aspiration event.  He required intubation.  Significant Events: 8/2 admitted after seizure with resultant hypoxic respiratory failure -intubation 8/3 HD initiated for volume overload -stopped due to hypotension 8/3 EEG noted diffuse slowing but no seizure or epileptogenicity 8/5 extubated -found to have right arm weakness/unable to lift right arm above shoulder 8/6 TR[facility] assumed care - transferred to Med/Surg bed  8/10 MRI with acute stroke  8/13 pending SNF placement  Assessment & Plan:   Acute CVA MRI brain with acute infarct left PCA territory, multiple small foci of acute ischemia left cerebellar hemisphere, single focus of acute ischemia right cerebellum.  MRA head with no acute findings.  Carotid ultrasound with 1-39% stenosis bilaterally.  TTE with LVEF 55-60%, no regional wall motion normalities, severe LV H, mildly reduced RV systolic function, mild/moderate MV regurgitation.  LDL 105, hemoglobin A1c 5.2.  Etiology likely related to hypercoagulable disorder from SLE/antiphospholipid antibody versus cardiogenic embolism.  Neurology was consulted and followed during hospital course.  Recommending switching Eliquis to to Lovenox, although per pharmacy not a great recommendation given his ESRD status.  Will continue Lovenox bridge and initiated on warfarin; with goal INR 2-3.Marland Kitchen  Recommend to avoid driving until peripheral vision loss improves. -- Warfarin, pharmacy consulted for dosing/monitoring -- INR daily; goal  2-3; INR 2.0 today  Seizure Patient presenting to ED after sustaining seizure.  Etiology likely poor compliance with home AEDs. -- Keppra 500 mg p.o. twice daily -- Seizure precautions  Per [facility] statutes, patients with seizures are not allowed to drive until they have been seizure-free for six months and cleared by a physician. Use caution when using heavy equipment or power tools. Avoid working on ladders or at heights. Take showers instead of baths. Ensure the water temperature is not too high on the home water heater. Do not go swimming alone. Do not lock yourself in a room alone (i.e. bathroom). When caring for infants or small children, sit down when holding, feeding, or changing them to minimize risk of injury to the child in the event you have a seizure. Maintain good sleep hygiene. Avoid alcohol.   Compressive cervical myelopathy MR C-spine with C5-6 herniated disc, spinal stenosis and cervical myopathy.  Seen by neurosurgery, Dr. Lovell Sheehan on 01/23/2023 with recommendation of a C5-6 anterior cervicectomy fusion and plating; plans for outpatient follow-up in 2 weeks.  Acute hypoxic respiratory failure Aspiration pneumonitis Patient likely suffered aspiration event following seizure.  Completed 6-day course of IV antibiotics.  ESRD on HD MWF -- Nephrology following, appreciate assistance  Shock secondary to sepsis versus sedation induced Required transient use of vasopressors during mechanical ventilation, now resolved.  Completed 6-day course of IV antibiotics.  Metabolic encephalopathy Etiology likely secondary to seizure activity and hypoxia.   -- Delirium precautions  SLE Anti-Phospholipid syndrome Currently not followed by rheumatology.  Last seen in 2022.  On anticoagulation as above.  Needs to reestablish care on discharge.  Thrombocytopenia, chronic Platelet count 116, stable.  Anemia of chronic medical/renal  disease Hemoglobin 12.8, stable.  Essential  hypertension -- Discontinue carvedilol given hypotension -- Continue to monitor BP off of antihypertensives  Hyperlipidemia -- Atorvastatin 40 mg p.o. daily  Weakness/debility/deconditioning: -- Pending insurance authorization for SNF placement  DVT prophylaxis: SCDs Start: 01/19/23 0022 warfarin (COUMADIN) tablet 5 mg    Code Status: Full Code Family Communication: No family present at bedside  Disposition Plan:  Level of care: Med-Surg Status is: Inpatient Remains inpatient appropriate because: Medically stable for discharge to SNF once bed available and receives insurance authorization    Consultants:  Neurology PCCM Nephrology  Procedures:  Intubation 8/2 Extubation 8/5 EEG  Antimicrobials:  Vancomycin 8/3 - 8/3 Metronidazole 8/3 - 8/3 Cefepime/30-8/3 Unasyn 8/2 - 8/7   Subjective: Patient seen examined at bedside, lying in bed.  Sleeping but easy arousable.  No complaints this morning.  INR up to 2.0, discussed with pharmacist will discontinue Lovenox bridge today given concern for increased bleeding risk with Lovenox/ESRD.  No specific questions or concerns at this time.  INR remains subtherapeutic, 1.8 today.  Denies headache, no chest pain, no shortness of breath, no abdominal pain, no fever, no current nausea/vomiting, no diarrhea.  No acute events overnight per nursing staff.  Pending insurance authorization for SNF placement, medically stable for discharge once bed available.    Objective: Vitals:   02/02/23 2121 02/03/23 0449 02/03/23 0550 02/03/23 0837  BP: 102/75  102/66 113/86  Pulse: 71  (!) 47 65  Resp: 18  18 18   Temp: 98.1 F (36.7 C)  (!) 97.5 F (36.4 C) 97.9 F (36.6 C)  TempSrc: Oral  Oral Oral  SpO2: 96%  99% 97%  Weight:  102 kg    Height:        Intake/Output Summary (Last 24 hours) at 02/03/2023 0957 Last data filed at 02/03/2023 0800 Gross per 24 hour  Intake 120 ml  Output --  Net 120 ml   Filed Weights   02/01/23 0900  02/01/23 1316 02/03/23 0449  Weight: 103 kg 102.1 kg 102 kg    Examination:  Physical Exam: GEN: NAD, alert, chronically ill appearance HEENT: NCAT, PERRL, EOMI, sclera clear, MMM PULM: CTAB w/o wheezes/crackles, normal respiratory effort, on room air CV: RRR w/o M/G/R GI: abd soft, NTND, NABS MSK: no peripheral edema, moves all extremities independently NEURO: Moves all extremities independently with equal strength PSYCH: normal mood/affect Integumentary: No concerning rashes/lesions/wounds noted on exposed skin surfaces.    Data Reviewed: I have personally reviewed following labs and imaging studies  CBC: Recent Labs  Lab 01/28/23 0143 01/30/23 0648 02/01/23 0930  WBC 9.1 7.6 6.3  NEUTROABS  --  4.7  --   HGB 12.4* 12.8* 14.0  HCT 38.7* 39.6 41.9  MCV 93.9 94.5 95.0  PLT 107* 116* 111*   Basic Metabolic Panel: Recent Labs  Lab 01/28/23 0143 01/30/23 0648 02/01/23 1010  NA 134* 135 137  K 4.7 5.2* 4.0  CL 92* 92* 94*  CO2 21* 24 24  GLUCOSE 81 86 115*  BUN 65* 67* 65*  CREATININE 14.61* 15.18* 14.64*  CALCIUM 9.0 8.9 8.7*  MG 2.5* 2.4  --   PHOS 10.4* 10.7* 9.5*   GFR: Estimated Creatinine Clearance: 8.2 mL/min (A) (by C-G formula based on SCr of 14.64 mg/dL (H)). Liver Function Tests: Recent Labs  Lab 01/30/23 0648 02/01/23 1010  AST 22  --   ALT 19  --   ALKPHOS 85  --   BILITOT 0.7  --  PROT 8.6*  --   ALBUMIN 4.0 3.9   No results for input(s): "LIPASE", "AMYLASE" in the last 168 hours. No results for input(s): "AMMONIA" in the last 168 hours. Coagulation Profile: Recent Labs  Lab 01/30/23 0648 01/31/23 0158 02/01/23 0854 02/02/23 0649 02/03/23 0539  INR 1.2 1.2 1.8* 1.8* 2.0*   Cardiac Enzymes: No results for input(s): "CKTOTAL", "CKMB", "CKMBINDEX", "TROPONINI" in the last 168 hours. BNP (last 3 results) No results for input(s): "PROBNP" in the last 8760 hours. HbA1C: No results for input(s): "HGBA1C" in the last 72  hours. CBG: Recent Labs  Lab 02/01/23 2130  GLUCAP 354*    Lipid Profile: No results for input(s): "CHOL", "HDL", "LDLCALC", "TRIG", "CHOLHDL", "LDLDIRECT" in the last 72 hours. Thyroid Function Tests: No results for input(s): "TSH", "T4TOTAL", "FREET4", "T3FREE", "THYROIDAB" in the last 72 hours.  Anemia Panel: No results for input(s): "VITAMINB12", "FOLATE", "FERRITIN", "TIBC", "IRON", "RETICCTPCT" in the last 72 hours. Sepsis Labs: No results for input(s): "PROCALCITON", "LATICACIDVEN" in the last 168 hours.  No results found for this or any previous visit (from the past 240 hour(s)).       Radiology Studies: No results found.      Scheduled Meds:  atorvastatin  40 mg Oral Daily   Chlorhexidine Gluconate Cloth  6 each Topical Q0600   cinacalcet  60 mg Oral Q supper   feeding supplement (NEPRO CARB STEADY)  237 mL Oral BID BM   levETIRAcetam  500 mg Oral BID   multivitamin  1 tablet Oral QHS   sevelamer carbonate  2,400 mg Oral TID WC   warfarin  5 mg Oral ONCE-1600   Warfarin - Pharmacist Dosing Inpatient   Does not apply q1600   Continuous Infusions:  sodium chloride 10 mL/hr at 01/23/23 1400     LOS: 15 days    Time spent: 48 minutes spent on chart review, discussion with nursing staff, consultants, updating family and interview/physical exam; more than 50% of that time was spent in counseling and/or coordination of care.    Alvira Philips [place], DO [facility] Available via Epic secure chat 7am-7pm After these hours, please refer to coverage provider listed on [email or web address] 02/03/2023, 9:57 AM

## 2023-02-03 NOTE — Plan of Care (Signed)
  Problem: Education: Goal: Ability to describe self-care measures that may prevent or decrease complications (Diabetes Survival Skills Education) will improve Outcome: Progressing Goal: Individualized Educational Video(s) Outcome: Progressing   Problem: Coping: Goal: Ability to adjust to condition or change in health will improve Outcome: Progressing   Problem: Fluid Volume: Goal: Ability to maintain a balanced intake and output will improve Outcome: Progressing   Problem: Health Behavior/Discharge Planning: Goal: Ability to identify and utilize available resources and services will improve Outcome: Progressing Goal: Ability to manage health-related needs will improve Outcome: Progressing   Problem: Metabolic: Goal: Ability to maintain appropriate glucose levels will improve Outcome: Progressing   Problem: Nutritional: Goal: Maintenance of adequate nutrition will improve Outcome: Progressing Goal: Progress toward achieving an optimal weight will improve Outcome: Progressing   Problem: Skin Integrity: Goal: Risk for impaired skin integrity will decrease Outcome: Progressing   Problem: Tissue Perfusion: Goal: Adequacy of tissue perfusion will improve Outcome: Progressing   Problem: Education: Goal: Knowledge of General Education information will improve Description: Including pain rating scale, medication(s)/side effects and non-pharmacologic comfort measures Outcome: Progressing   Problem: Health Behavior/Discharge Planning: Goal: Ability to manage health-related needs will improve Outcome: Progressing   Problem: Clinical Measurements: Goal: Ability to maintain clinical measurements within normal limits will improve Outcome: Progressing Goal: Will remain free from infection Outcome: Progressing Goal: Diagnostic test results will improve Outcome: Progressing Goal: Respiratory complications will improve Outcome: Progressing Goal: Cardiovascular complication will  be avoided Outcome: Progressing   Problem: Activity: Goal: Risk for activity intolerance will decrease Outcome: Progressing   Problem: Nutrition: Goal: Adequate nutrition will be maintained Outcome: Progressing   Problem: Coping: Goal: Level of anxiety will decrease Outcome: Progressing   Problem: Elimination: Goal: Will not experience complications related to bowel motility Outcome: Progressing Goal: Will not experience complications related to urinary retention Outcome: Progressing   Problem: Pain Managment: Goal: General experience of comfort will improve Outcome: Progressing   Problem: Safety: Goal: Ability to remain free from injury will improve Outcome: Progressing   Problem: Skin Integrity: Goal: Risk for impaired skin integrity will decrease Outcome: Progressing   Problem: Education: Goal: Knowledge of disease or condition will improve Outcome: Progressing Goal: Knowledge of secondary prevention will improve (MUST DOCUMENT ALL) Outcome: Progressing Goal: Knowledge of patient specific risk factors will improve (Mark N/A or DELETE if not current risk factor) Outcome: Progressing   Problem: Ischemic Stroke/TIA Tissue Perfusion: Goal: Complications of ischemic stroke/TIA will be minimized Outcome: Progressing   Problem: Coping: Goal: Will verbalize positive feelings about self Outcome: Progressing Goal: Will identify appropriate support needs Outcome: Progressing   Problem: Health Behavior/Discharge Planning: Goal: Ability to manage health-related needs will improve Outcome: Progressing Goal: Goals will be collaboratively established with patient/family Outcome: Progressing   Problem: Self-Care: Goal: Ability to participate in self-care as condition permits will improve Outcome: Progressing Goal: Verbalization of feelings and concerns over difficulty with self-care will improve Outcome: Progressing Goal: Ability to communicate needs accurately will  improve Outcome: Progressing   Problem: Nutrition: Goal: Risk of aspiration will decrease Outcome: Progressing Goal: Dietary intake will improve Outcome: Progressing   

## 2023-02-03 NOTE — Plan of Care (Signed)
  Problem: Education: Goal: Ability to describe self-care measures that may prevent or decrease complications (Diabetes Survival Skills Education) will improve Outcome: Progressing Goal: Individualized Educational Video(s) Outcome: Progressing   Problem: Coping: Goal: Ability to adjust to condition or change in health will improve Outcome: Progressing   Problem: Fluid Volume: Goal: Ability to maintain a balanced intake and output will improve Outcome: Progressing   Problem: Health Behavior/Discharge Planning: Goal: Ability to identify and utilize available resources and services will improve Outcome: Progressing Goal: Ability to manage health-related needs will improve Outcome: Progressing   Problem: Metabolic: Goal: Ability to maintain appropriate glucose levels will improve Outcome: Progressing   Problem: Nutritional: Goal: Maintenance of adequate nutrition will improve Outcome: Progressing Goal: Progress toward achieving an optimal weight will improve Outcome: Progressing   Problem: Skin Integrity: Goal: Risk for impaired skin integrity will decrease Outcome: Progressing   Problem: Tissue Perfusion: Goal: Adequacy of tissue perfusion will improve Outcome: Progressing   Problem: Education: Goal: Knowledge of General Education information will improve Description: Including pain rating scale, medication(s)/side effects and non-pharmacologic comfort measures Outcome: Progressing   Problem: Health Behavior/Discharge Planning: Goal: Ability to manage health-related needs will improve Outcome: Progressing   Problem: Clinical Measurements: Goal: Ability to maintain clinical measurements within normal limits will improve Outcome: Progressing Goal: Will remain free from infection Outcome: Progressing Goal: Diagnostic test results will improve Outcome: Progressing Goal: Respiratory complications will improve Outcome: Progressing Goal: Cardiovascular complication will  be avoided Outcome: Progressing   Problem: Activity: Goal: Risk for activity intolerance will decrease Outcome: Progressing   Problem: Nutrition: Goal: Adequate nutrition will be maintained Outcome: Progressing   Problem: Coping: Goal: Level of anxiety will decrease Outcome: Progressing   Problem: Elimination: Goal: Will not experience complications related to bowel motility Outcome: Progressing Goal: Will not experience complications related to urinary retention Outcome: Progressing   Problem: Pain Managment: Goal: General experience of comfort will improve Outcome: Progressing   Problem: Safety: Goal: Ability to remain free from injury will improve Outcome: Progressing   Problem: Skin Integrity: Goal: Risk for impaired skin integrity will decrease Outcome: Progressing   Problem: Education: Goal: Knowledge of disease or condition will improve 02/03/2023 0601 by Ramon Dredge, RN Outcome: Progressing 02/03/2023 0455 by Ramon Dredge, RN Outcome: Progressing Goal: Knowledge of secondary prevention will improve (MUST DOCUMENT ALL) 02/03/2023 0601 by Ramon Dredge, RN Outcome: Progressing 02/03/2023 0455 by Ramon Dredge, RN Outcome: Progressing Goal: Knowledge of patient specific risk factors will improve Loraine Leriche N/A or DELETE if not current risk factor) 02/03/2023 0601 by Ramon Dredge, RN Outcome: Progressing 02/03/2023 0455 by Ramon Dredge, RN Outcome: Progressing   Problem: Ischemic Stroke/TIA Tissue Perfusion: Goal: Complications of ischemic stroke/TIA will be minimized 02/03/2023 0601 by Ramon Dredge, RN Outcome: Progressing 02/03/2023 0455 by Ramon Dredge, RN Outcome: Progressing   Problem: Coping: Goal: Will verbalize positive feelings about self Outcome: Progressing Goal: Will identify appropriate support needs Outcome: Progressing   Problem: Health Behavior/Discharge Planning: Goal: Ability to  manage health-related needs will improve Outcome: Progressing Goal: Goals will be collaboratively established with patient/family Outcome: Progressing   Problem: Self-Care: Goal: Ability to participate in self-care as condition permits will improve Outcome: Progressing Goal: Verbalization of feelings and concerns over difficulty with self-care will improve Outcome: Progressing Goal: Ability to communicate needs accurately will improve Outcome: Progressing   Problem: Nutrition: Goal: Risk of aspiration will decrease Outcome: Progressing Goal: Dietary intake will improve Outcome: Progressing

## 2023-02-03 NOTE — Plan of Care (Signed)
Patient alert and oriented. No distress noted. Patient complained of itching, relieved by PRN Benadryl. Will continue to monitor.  Problem: Pain Managment: Goal: General experience of comfort will improve Outcome: Progressing   Problem: Safety: Goal: Ability to remain free from injury will improve Outcome: Progressing   Problem: Coping: Goal: Will verbalize positive feelings about self Outcome: Progressing

## 2023-02-03 NOTE — Progress Notes (Signed)
  [facility] Progress Note   Subjective:   Seen up and walking at nurse's station. Says he needs to get a Consulting civil engineer for his phone. No complaints today.   Objective Vitals:   02/02/23 2121 02/03/23 0449 02/03/23 0550 02/03/23 0837  BP: 102/75  102/66 113/86  Pulse: 71  (!) 47 65  Resp: 18  18 18   Temp: 98.1 F (36.7 C)  (!) 97.5 F (36.4 C) 97.9 F (36.6 C)  TempSrc: Oral  Oral Oral  SpO2: 96%  99% 97%  Weight:  102 kg    Height:       Physical Exam General: Awake and alert, nad  Heart: Normal rate, no rub Lungs: Bilateral chest rise, normal wob Abdomen:  non-distended Extremities: trace edema,  Dialysis Access: [facility] c/d/i    Dialysis Orders: [facility] MWF 4h   400/800 112.8kg  2/2 bath  TDC  Heparin none - Mircera 120 mcg IV q 2 weeks (last dose 01/16/2023) - Venofer 50 mg IV q week   Assessment/Plan: Seizures: Had not been taking OP meds. Found down at [employer].  Now improved Acute CVA - per MRI done 8/10 w/ cerebellar CVA w/o hemorrhage.  Recommendation was to switch Eliquis to warfarin.  In the process.  Mgmt per neuro and primary Acute hypoxic respiratory failure: pulm edema + possible aspiration PNA. Rx'd w/ HD and IV abx. Resolved.   Volume - under dry wt. Repeat CXR 8/10 showed resolution of edema. Challenging weight and down as low as 103. Adjust EDW at DC as needed  BP - BP's are labile with some in the 90's. Stopped norvasc; carvedilol with hold parameters  ESRD -  MWF HD. Continue schedule  Anemia  - Hb dropped early this admit into 7's, but with volume removal and esa Rx, hemoglobin now 14.  Stop ESA entirely.  Metabolic bone disease -  Phos above goal. Continue sevelamer binder, sensipar 60mg  every day.   Nutrition - push protein   [name] Blase PA-C [facility] 02/03/2023, 11:50 AM  Recent Labs  Lab 01/30/23 0648 02/01/23 0930 02/01/23 1010  HGB 12.8* 14.0  --   ALBUMIN 4.0  --  3.9  CALCIUM 8.9  --  8.7*   PHOS 10.7*  --  9.5*  CREATININE 15.18*  --  14.64*  K 5.2*  --  4.0    Inpatient medications:  atorvastatin  40 mg Oral Daily   Chlorhexidine Gluconate Cloth  6 each Topical Q0600   cinacalcet  60 mg Oral Q supper   feeding supplement (NEPRO CARB STEADY)  237 mL Oral BID BM   levETIRAcetam  500 mg Oral BID   multivitamin  1 tablet Oral QHS   sevelamer carbonate  2,400 mg Oral TID WC   warfarin  5 mg Oral ONCE-1600   Warfarin - Pharmacist Dosing Inpatient   Does not apply q1600    sodium chloride 10 mL/hr at 01/23/23 1400   acetaminophen, diphenhydrAMINE, docusate sodium, LORazepam, ondansetron (ZOFRAN) IV, mouth rinse, polyethylene glycol

## 2023-02-04 DIAGNOSIS — R569 Unspecified convulsions: Secondary | ICD-10-CM | POA: Diagnosis not present

## 2023-02-04 LAB — CBC
HCT: 41 % (ref 39.0–52.0)
Hemoglobin: 13.6 g/dL (ref 13.0–17.0)
MCH: 31.3 pg (ref 26.0–34.0)
MCHC: 33.2 g/dL (ref 30.0–36.0)
MCV: 94.3 fL (ref 80.0–100.0)
Platelets: 145 10*3/uL — ABNORMAL LOW (ref 150–400)
RBC: 4.35 MIL/uL (ref 4.22–5.81)
RDW: 14.8 % (ref 11.5–15.5)
WBC: 6.7 10*3/uL (ref 4.0–10.5)
nRBC: 0 % (ref 0.0–0.2)

## 2023-02-04 LAB — RENAL FUNCTION PANEL
Albumin: 3.8 g/dL (ref 3.5–5.0)
Anion gap: 19 — ABNORMAL HIGH (ref 5–15)
BUN: 70 mg/dL — ABNORMAL HIGH (ref 6–20)
CO2: 22 mmol/L (ref 22–32)
Calcium: 8.9 mg/dL (ref 8.9–10.3)
Chloride: 92 mmol/L — ABNORMAL LOW (ref 98–111)
Creatinine, Ser: 16.55 mg/dL — ABNORMAL HIGH (ref 0.61–1.24)
GFR, Estimated: 3 mL/min — ABNORMAL LOW (ref >60)
Glucose, Bld: 96 mg/dL (ref 70–99)
Phosphorus: 8.2 mg/dL — ABNORMAL HIGH (ref 2.5–4.6)
Potassium: 4.5 mmol/L (ref 3.5–5.1)
Sodium: 133 mmol/L — ABNORMAL LOW (ref 135–145)

## 2023-02-04 MED ORDER — HEPARIN SODIUM (PORCINE) 1000 UNIT/ML IJ SOLN
INTRAMUSCULAR | Status: AC
  Administered 2023-02-04: 1000 [IU]
  Filled 2023-02-04: qty 4

## 2023-02-04 MED ORDER — WARFARIN SODIUM 5 MG PO TABS
5.0000 mg | ORAL_TABLET | Freq: Once | ORAL | Status: AC
  Administered 2023-02-04: 5 mg via ORAL
  Filled 2023-02-04: qty 1

## 2023-02-04 NOTE — Plan of Care (Signed)
  Problem: Education: Goal: Ability to describe self-care measures that may prevent or decrease complications (Diabetes Survival Skills Education) will improve Outcome: Progressing Goal: Individualized Educational Video(s) Outcome: Progressing   Problem: Coping: Goal: Ability to adjust to condition or change in health will improve Outcome: Progressing   Problem: Fluid Volume: Goal: Ability to maintain a balanced intake and output will improve Outcome: Progressing   Problem: Health Behavior/Discharge Planning: Goal: Ability to identify and utilize available resources and services will improve Outcome: Progressing Goal: Ability to manage health-related needs will improve Outcome: Progressing   Problem: Metabolic: Goal: Ability to maintain appropriate glucose levels will improve Outcome: Progressing   Problem: Nutritional: Goal: Maintenance of adequate nutrition will improve Outcome: Progressing Goal: Progress toward achieving an optimal weight will improve Outcome: Progressing   Problem: Skin Integrity: Goal: Risk for impaired skin integrity will decrease Outcome: Progressing   Problem: Tissue Perfusion: Goal: Adequacy of tissue perfusion will improve Outcome: Progressing   Problem: Education: Goal: Knowledge of General Education information will improve Description: Including pain rating scale, medication(s)/side effects and non-pharmacologic comfort measures Outcome: Progressing   Problem: Health Behavior/Discharge Planning: Goal: Ability to manage health-related needs will improve Outcome: Progressing   Problem: Clinical Measurements: Goal: Ability to maintain clinical measurements within normal limits will improve Outcome: Progressing Goal: Will remain free from infection Outcome: Progressing Goal: Diagnostic test results will improve Outcome: Progressing Goal: Respiratory complications will improve Outcome: Progressing Goal: Cardiovascular complication will  be avoided Outcome: Progressing   Problem: Activity: Goal: Risk for activity intolerance will decrease Outcome: Progressing   Problem: Nutrition: Goal: Adequate nutrition will be maintained Outcome: Progressing   Problem: Coping: Goal: Level of anxiety will decrease Outcome: Progressing   Problem: Elimination: Goal: Will not experience complications related to bowel motility Outcome: Progressing Goal: Will not experience complications related to urinary retention Outcome: Progressing   Problem: Pain Managment: Goal: General experience of comfort will improve Outcome: Progressing   Problem: Safety: Goal: Ability to remain free from injury will improve Outcome: Progressing   Problem: Skin Integrity: Goal: Risk for impaired skin integrity will decrease Outcome: Progressing   Problem: Education: Goal: Knowledge of disease or condition will improve Outcome: Progressing Goal: Knowledge of secondary prevention will improve (MUST DOCUMENT ALL) Outcome: Progressing Goal: Knowledge of patient specific risk factors will improve (Mark N/A or DELETE if not current risk factor) Outcome: Progressing   Problem: Ischemic Stroke/TIA Tissue Perfusion: Goal: Complications of ischemic stroke/TIA will be minimized Outcome: Progressing   Problem: Coping: Goal: Will verbalize positive feelings about self Outcome: Progressing Goal: Will identify appropriate support needs Outcome: Progressing   Problem: Health Behavior/Discharge Planning: Goal: Ability to manage health-related needs will improve Outcome: Progressing Goal: Goals will be collaboratively established with patient/family Outcome: Progressing   Problem: Self-Care: Goal: Ability to participate in self-care as condition permits will improve Outcome: Progressing Goal: Verbalization of feelings and concerns over difficulty with self-care will improve Outcome: Progressing Goal: Ability to communicate needs accurately will  improve Outcome: Progressing   Problem: Nutrition: Goal: Risk of aspiration will decrease Outcome: Progressing Goal: Dietary intake will improve Outcome: Progressing   

## 2023-02-04 NOTE — Progress Notes (Addendum)
Patient BP was 68/47 with MAP 55 and HR 111. MEWS score red. Patient was asymptomatic. Rechecked his BP again on his wrist and it was 88/62 with MAP 72 and HR 78.  MD [place] Eric made aware. No new order received at this time. Will continue to monitor.

## 2023-02-04 NOTE — Progress Notes (Signed)
  [facility] Progress Note   Subjective:  Seen on HD - feels well. 2L UFG. Denies CP or dyspnea.  Objective Vitals:   02/04/23 0820 02/04/23 1040 02/04/23 1100 02/04/23 1130  BP: 95/67 133/86 (!) 123/96 117/81  Pulse: 66 60 63 65  Resp: 17 10 16 12   Temp: 97.8 F (36.6 C) 97.6 F (36.4 C)    TempSrc: Oral Oral    SpO2: 95% 100%    Weight:      Height:       Physical Exam General: Well appearing man, NAD. Room air. Heart: RRR; no murmur Lungs: CTA anteriorly Abdomen: soft Extremities: no LE edema Dialysis Access:  [facility]  Additional Objective Labs: Basic Metabolic Panel: Recent Labs  Lab 01/30/23 0648 02/01/23 1010 02/04/23 1101  NA 135 137 133*  K 5.2* 4.0 4.5  CL 92* 94* 92*  CO2 24 24 22   GLUCOSE 86 115* 96  BUN 67* 65* 70*  CREATININE 15.18* 14.64* 16.55*  CALCIUM 8.9 8.7* 8.9  PHOS 10.7* 9.5* 8.2*   Liver Function Tests: Recent Labs  Lab 01/30/23 0648 02/01/23 1010 02/04/23 1101  AST 22  --   --   ALT 19  --   --   ALKPHOS 85  --   --   BILITOT 0.7  --   --   PROT 8.6*  --   --   ALBUMIN 4.0 3.9 3.8   CBC: Recent Labs  Lab 01/30/23 0648 02/01/23 0930 02/04/23 1101  WBC 7.6 6.3 6.7  NEUTROABS 4.7  --   --   HGB 12.8* 14.0 13.6  HCT 39.6 41.9 41.0  MCV 94.5 95.0 94.3  PLT 116* 111* 145*   Medications:  sodium chloride 10 mL/hr at 01/23/23 1400    atorvastatin  40 mg Oral Daily   Chlorhexidine Gluconate Cloth  6 each Topical Q0600   cinacalcet  60 mg Oral Q supper   feeding supplement (NEPRO CARB STEADY)  237 mL Oral BID BM   levETIRAcetam  500 mg Oral BID   multivitamin  1 tablet Oral QHS   sevelamer carbonate  2,400 mg Oral TID WC   warfarin  5 mg Oral ONCE-1600   Warfarin - Pharmacist Dosing Inpatient   Does not apply q1600    Dialysis Orders: [facility] MWF 4h   400/800 112.8kg  2/2 bath  TDC  Heparin none - Mircera 120 mcg IV q 2 weeks (last dose 01/16/2023) - Venofer 50 mg IV q week   Assessment/Plan: Seizures:  Had not been taking OP meds. Found down at [facility]. Improved now. Acute CVA per MRI done 8/10 w/ cerebellar CVA w/o hemorrhage.  Recommendation was to switch Eliquis to warfarin -> in progress. INR 2. FYI - dialysis clinic will NOT manage his INR, needs PCP to do this. Acute hypoxic respiratory failure: Pulm edema + possible aspiration PNA on admit, better now. BP/volume: BP controlled, now well below prior EDW - will lower on d/c (~102kg). Amlod on hold. ESRD: Continue HD on usual MWF schedule - HD today. Anemia: Hgb down to 7's earlier this admit - now > 12, ESA on hold. Secondary HPTH: Ca ok, Phos high btu improving. Continue sevelamer, sensipar 60mg  every day.  Nutrition: Alb slightly low, continue renal/high protein diet  Ozzie Hoyle, PA-C 02/04/2023, 11:52 AM  [employer]

## 2023-02-04 NOTE — Progress Notes (Signed)
   02/04/23 1400  Vitals  Temp 97.6 F (36.4 C)  Temp Source Oral  BP 126/80  BP Location Right Arm  BP Method Automatic  Patient Position (if appropriate) Lying  Pulse Rate 88  Resp 18  Oxygen Therapy  SpO2 100 %  O2 Device Room Air  During Treatment Monitoring  Intra-Hemodialysis Comments Tx completed  Post Treatment  Dialyzer Clearance Lightly streaked  Hemodialysis Intake (mL) 0 mL  Liters Processed 74  Fluid Removed (mL) 1500 mL  Tolerated HD Treatment Yes  Post-Hemodialysis Comments Pt goal met.  Hemodialysis Catheter Right Double lumen Permanent (Tunneled)  Placement Date/Time: (c) 01/19/23 0106   Placed prior to admission: Yes  Person Inserting LDA: Wynona Luna, RN  Orientation: Right  Hemodialysis Catheter Type: Double lumen Permanent (Tunneled)  Site Condition No complications  Blue Lumen Status Heparin locked  Red Lumen Status Heparin locked  Catheter fill solution Heparin 1000 units/ml  Catheter fill volume (Arterial) 1.6 cc  Catheter fill volume (Venous) 1.6  Dressing Type Gauze/Drain sponge;Transparent  Dressing Status Antimicrobial disc in place  Interventions New dressing  Drainage Description None  Dressing Change Due 02/06/23  Post treatment catheter status Capped and Clamped

## 2023-02-04 NOTE — TOC Progression Note (Addendum)
Transition of Care [facility]) - Progression Note    Patient Details  Name: STEFONE SHERLING MRN: [id number] Date of Birth: 1981/05/26  Transition of Care [facility]) CM/SW Contact  Eliodoro Gullett A [place], [place] Phone Number: 02/04/2023, 12:28 PM  Clinical Narrative:     Berkley Harvey started for pt. Status pending.   Reference ID: [id number]  TOC will continue to follow.   Expected Discharge Plan: OP Rehab Barriers to Discharge: No Barriers Identified  Expected Discharge Plan and Services   Discharge Planning Services: CM Consult Post Acute Care Choice: Durable Medical Equipment, Resumption of Svcs/PTA Provider Living arrangements for the past 2 months: Hotel/Motel                 DME Arranged: Walker rolling DME Agency: [employer] Date DME Agency Contacted: 01/25/23 Time DME Agency Contacted: 1539 Representative spoke with at DME Agency: Vaughan Basta, CM with [employer]             Social Determinants of Health (SDOH) Interventions SDOH Screenings   Food Insecurity: No Food Insecurity (02/03/2023)  Housing: Low Risk  (02/03/2023)  Transportation Needs: No Transportation Needs (02/03/2023)  Utilities: Not At Risk (02/03/2023)    Readmission Risk Interventions    01/25/2023    3:40 PM  Readmission Risk Prevention Plan  Post Dischage Appt Complete  Medication Screening Complete  Transportation Screening Complete

## 2023-02-04 NOTE — Progress Notes (Signed)
PT Cancellation Note  Patient Details Name: [name] [name] MRN: [id number] DOB: January 12, 1981   Cancelled Treatment:    Reason Eval/Treat Not Completed: (P) Patient at procedure or test/unavailable, pt off unit for HD. Will check back as schedule allows to continue with PT POC.  Lenora Boys. PTA Acute Rehabilitation Services Office: [phone]    Catalina Antigua 02/04/2023, 1:45 PM

## 2023-02-04 NOTE — Progress Notes (Signed)
PROGRESS NOTE    [name] [name]  ZOX:[id number] DOB: [birth year] DOA: 01/18/2023 PCP: No primary care provider on file.    Brief Narrative:   [name] [name] is a 42 y.o. 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  Suspected aspiration event.  He required intubation.  Significant Events: 8/2 admitted after seizure with resultant hypoxic respiratory failure -intubation 8/3 HD initiated for volume overload -stopped due to hypotension 8/3 EEG noted diffuse slowing but no seizure or epileptogenicity 8/5 extubated -found to have right arm weakness/unable to lift right arm above shoulder 8/6 TRH assumed care - transferred to Med/Surg bed  8/10 MRI with acute stroke  8/13 pending SNF placement  Assessment & Plan:   Acute CVA MRI brain with acute infarct left PCA territory, multiple small foci of acute ischemia left cerebellar hemisphere, single focus of acute ischemia right cerebellum.  MRA head with no acute findings.  Carotid ultrasound with 1-39% stenosis bilaterally.  TTE with LVEF 55-60%, no regional wall motion normalities, severe LV H, mildly reduced RV systolic function, mild/moderate MV regurgitation.  LDL 105, hemoglobin A1c 5.2.  Etiology likely related to hypercoagulable disorder from SLE/antiphospholipid antibody versus cardiogenic embolism.  Neurology was consulted and followed during hospital course.  Recommending switching Eliquis to to Lovenox, although per pharmacy not a great recommendation given his ESRD status.  Will continue Lovenox bridge and initiated on warfarin; with goal INR 2-3.Marland Kitchen  Recommend to avoid driving until peripheral vision loss improves. -- Warfarin, pharmacy consulted for dosing/monitoring -- INR daily; goal  2-3; labs pending today  Seizure Patient presenting to ED after sustaining seizure.  Etiology likely poor compliance with home AEDs. -- Keppra 500 mg p.o. twice daily -- Seizure precautions  Per [facility] statutes, patients with seizures are not allowed to drive until they have been seizure-free for six months and cleared by a physician. Use caution when using heavy equipment or power tools. Avoid working on ladders or at heights. Take showers instead of baths. Ensure the water temperature is not too high on the home water heater. Do not go swimming alone. Do not lock yourself in a room alone (i.e. bathroom). When caring for infants or small children, sit down when holding, feeding, or changing them to minimize risk of injury to the child in the event you have a seizure. Maintain good sleep hygiene. Avoid alcohol.   Compressive cervical myelopathy MR C-spine with C5-6 herniated disc, spinal stenosis and cervical myopathy.  Seen by neurosurgery, Dr. Lovell Sheehan on 01/23/2023 with recommendation of a C5-6 anterior cervicectomy fusion and plating; plans for outpatient follow-up in 2 weeks.  Acute hypoxic respiratory failure Aspiration pneumonitis Patient likely suffered aspiration event following seizure.  Completed 6-day course of IV antibiotics.  ESRD on HD MWF -- Nephrology following, appreciate assistance  Shock secondary to sepsis versus sedation induced Required transient use of vasopressors during mechanical ventilation, now resolved.  Completed 6-day course of IV antibiotics.  Metabolic encephalopathy Etiology likely secondary to seizure activity and hypoxia.   -- Delirium precautions  SLE Anti-Phospholipid syndrome Currently not followed by rheumatology.  Last seen in 2022.  On anticoagulation as above.  Needs to reestablish care on discharge.  Thrombocytopenia, chronic Platelet count 116, stable.  Anemia of chronic medical/renal  disease Hemoglobin 12.8,  stable.  Essential hypertension -- Discontinued carvedilol given hypotension -- Continue to monitor BP off of antihypertensives  Hyperlipidemia -- Atorvastatin 40 mg p.o. daily  Weakness/debility/deconditioning: -- Pending insurance authorization for SNF placement  DVT prophylaxis: SCDs Start: 01/19/23 0022 warfarin (COUMADIN) tablet 5 mg    Code Status: Full Code Family Communication: No family present at bedside  Disposition Plan:  Level of care: Med-Surg Status is: Inpatient Remains inpatient appropriate because: Medically stable for discharge to SNF once bed available and receives insurance authorization    Consultants:  Neurology PCCM Nephrology  Procedures:  Intubation 8/2 Extubation 8/5 EEG  Antimicrobials:  Vancomycin 8/3 - 8/3 Metronidazole 8/3 - 8/3 Cefepime/30-8/3 Unasyn 8/2 - 8/7   Subjective: Patient seen examined at bedside, lying in bed.  Sleeping but arousable.  No specific complaints this morning.  Labs pending this morning.  For HD today.  Discussed with social work, English as a second language teacher remains pending.  Denies headache, no chest pain, no shortness of breath, no abdominal pain, no fever, no current nausea/vomiting, no diarrhea.  No acute events overnight per nursing staff.   Patient is medically stable for discharge to SNF once bed available with insurance authorization  Objective: Vitals:   02/03/23 2043 02/04/23 0500 02/04/23 0550 02/04/23 0820  BP:   (!) 144/95 95/67  Pulse: (!) 110  63 66  Resp: 17  18 17   Temp:   97.6 F (36.4 C) 97.8 F (36.6 C)  TempSrc:   Oral Oral  SpO2: 98%  98% 95%  Weight:  102 kg    Height:        Intake/Output Summary (Last 24 hours) at 02/04/2023 0930 Last data filed at 02/04/2023 9629 Gross per 24 hour  Intake 90 ml  Output --  Net 90 ml   Filed Weights   02/01/23 1316 02/03/23 0449 02/04/23 0500  Weight: 102.1 kg 102 kg 102 kg    Examination:  Physical Exam: GEN: NAD, alert, chronically ill  appearance HEENT: NCAT, PERRL, EOMI, sclera clear, MMM PULM: CTAB w/o wheezes/crackles, normal respiratory effort, on room air CV: RRR w/o M/G/R, tunneled HD catheter noted left chest GI: abd soft, NTND, NABS MSK: no peripheral edema, moves all extremities independently NEURO: Moves all extremities independently with equal strength PSYCH: normal mood/affect Integumentary: No concerning rashes/lesions/wounds noted on exposed skin surfaces.    Data Reviewed: I have personally reviewed following labs and imaging studies  CBC: Recent Labs  Lab 01/30/23 0648 02/01/23 0930  WBC 7.6 6.3  NEUTROABS 4.7  --   HGB 12.8* 14.0  HCT 39.6 41.9  MCV 94.5 95.0  PLT 116* 111*   Basic Metabolic Panel: Recent Labs  Lab 01/30/23 0648 02/01/23 1010  NA 135 137  K 5.2* 4.0  CL 92* 94*  CO2 24 24  GLUCOSE 86 115*  BUN 67* 65*  CREATININE 15.18* 14.64*  CALCIUM 8.9 8.7*  MG 2.4  --   PHOS 10.7* 9.5*   GFR: Estimated Creatinine Clearance: 8.2 mL/min (A) (by C-G formula based on SCr of 14.64 mg/dL (H)). Liver Function Tests: Recent Labs  Lab 01/30/23 0648 02/01/23 1010  AST 22  --   ALT 19  --   ALKPHOS 85  --   BILITOT 0.7  --   PROT 8.6*  --   ALBUMIN 4.0 3.9   No results for input(s): "LIPASE", "AMYLASE" in the last 168 hours. No results for input(s): "AMMONIA" in the last 168 hours. Coagulation Profile: Recent Labs  Lab 01/30/23 0648 01/31/23 0158 02/01/23 0854 02/02/23 0649 02/03/23 0539  INR 1.2 1.2 1.8* 1.8* 2.0*   Cardiac Enzymes: No results for input(s): "CKTOTAL", "CKMB", "CKMBINDEX", "TROPONINI" in the last 168 hours. BNP (last 3 results) No results for input(s): "PROBNP" in the last 8760 hours. HbA1C: No results for input(s): "HGBA1C" in the last 72 hours. CBG: Recent Labs  Lab 02/01/23 2130  GLUCAP 354*    Lipid Profile: No results for input(s): "CHOL", "HDL", "LDLCALC", "TRIG", "CHOLHDL", "LDLDIRECT" in the last 72 hours. Thyroid Function  Tests: No results for input(s): "TSH", "T4TOTAL", "FREET4", "T3FREE", "THYROIDAB" in the last 72 hours.  Anemia Panel: No results for input(s): "VITAMINB12", "FOLATE", "FERRITIN", "TIBC", "IRON", "RETICCTPCT" in the last 72 hours. Sepsis Labs: No results for input(s): "PROCALCITON", "LATICACIDVEN" in the last 168 hours.  No results found for this or any previous visit (from the past 240 hour(s)).       Radiology Studies: No results found.      Scheduled Meds:  atorvastatin  40 mg Oral Daily   Chlorhexidine Gluconate Cloth  6 each Topical Q0600   cinacalcet  60 mg Oral Q supper   feeding supplement (NEPRO CARB STEADY)  237 mL Oral BID BM   levETIRAcetam  500 mg Oral BID   multivitamin  1 tablet Oral QHS   sevelamer carbonate  2,400 mg Oral TID WC   warfarin  5 mg Oral ONCE-1600   Warfarin - Pharmacist Dosing Inpatient   Does not apply q1600   Continuous Infusions:  sodium chloride 10 mL/hr at 01/23/23 1400     LOS: 16 days    Time spent: 48 minutes spent on chart review, discussion with nursing staff, consultants, updating family and interview/physical exam; more than 50% of that time was spent in counseling and/or coordination of care.    Alvira Philips [place], DO [facility] Available via Epic secure chat 7am-7pm After these hours, please refer to coverage provider listed on [email or web address] 02/04/2023, 9:30 AM

## 2023-02-05 DIAGNOSIS — R569 Unspecified convulsions: Secondary | ICD-10-CM | POA: Diagnosis not present

## 2023-02-05 LAB — PROTIME-INR
INR: 2.3 — ABNORMAL HIGH (ref 0.8–1.2)
Prothrombin Time: 25.7 s — ABNORMAL HIGH (ref 11.4–15.2)

## 2023-02-05 MED ORDER — MIDODRINE HCL 5 MG PO TABS
5.0000 mg | ORAL_TABLET | Freq: Three times a day (TID) | ORAL | Status: DC
  Administered 2023-02-05 – 2023-02-13 (×25): 5 mg via ORAL
  Filled 2023-02-05 (×25): qty 1

## 2023-02-05 MED ORDER — WARFARIN SODIUM 5 MG PO TABS
5.0000 mg | ORAL_TABLET | Freq: Once | ORAL | Status: AC
  Administered 2023-02-05: 5 mg via ORAL
  Filled 2023-02-05: qty 1

## 2023-02-05 MED ORDER — SODIUM CHLORIDE 0.9 % IV BOLUS
500.0000 mL | Freq: Once | INTRAVENOUS | Status: AC
  Administered 2023-02-05: 500 mL via INTRAVENOUS
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
## 2023-02-05 NOTE — TOC Progression Note (Addendum)
Transition of Care [facility]) - Progression Note    Patient Details  Name: [name] [name] MRN: [id number] Date of Birth: 02-28-1981  [facility] CM/SW Contact  Ramie Palladino A [place], [place] Phone Number: 02/05/2023, 9:45 AM  Clinical Narrative:     Update 1620  Additional clinicals uploaded to [facility]. Auth still pending to SNF.  TOC will continue to follow.    Update 1545 CSW made attempt to upload PT and OT notes as requested by [facility]. No updated notes within 24/48 hours. Acute rehab staff notified. CSW notified [facility] that no notes available currenlty and will upload when available.   TOC will continue to follow.   Pt's Berkley Harvey is still pending for insurance.   TOC will continue to follow.   Expected Discharge Plan: OP Rehab Barriers to Discharge: No Barriers Identified  Expected Discharge Plan and Services   Discharge Planning Services: CM Consult Post Acute Care Choice: Durable Medical Equipment, Resumption of Svcs/PTA Provider Living arrangements for the past 2 months: Hotel/Motel                 DME Arranged: Walker rolling DME Agency: [employer] Date DME Agency Contacted: 01/25/23 Time DME Agency Contacted: 1539 Representative spoke with at DME Agency: Vaughan Basta, CM with [employer]             Social Determinants of Health (SDOH) Interventions SDOH Screenings   Food Insecurity: No Food Insecurity (02/03/2023)  Housing: Low Risk  (02/03/2023)  Transportation Needs: No Transportation Needs (02/03/2023)  Utilities: Not At Risk (02/03/2023)    Readmission Risk Interventions    01/25/2023    3:40 PM  Readmission Risk Prevention Plan  Post Dischage Appt Complete  Medication Screening Complete  Transportation Screening Complete

## 2023-02-05 NOTE — Progress Notes (Signed)
Nutrition Follow-up  DOCUMENTATION CODES:   Not applicable  INTERVENTION:  - Continue 2gmNa diet, 1200 mL fluid.   NUTRITION DIAGNOSIS:   Inadequate oral intake related to inability to eat as evidenced by NPO status.  GOAL:   Patient will meet greater than or equal to 90% of their needs  MONITOR:   PO intake, Labs, I & O's, Supplement acceptance  REASON FOR ASSESSMENT:   Consult Enteral/tube feeding initiation and management  ASSESSMENT:   42 y.o. male presented to the ED with AMS and seizure like activity. PMH includes HTN, seizures, CVA, and ESRD on HD. Pt admitted with breakthrough seizures.  Meds reviewed: lipitor, rena-vit, renvela, warfarin. Labs reviewed: Na low, chloride low, BUN/Creatinine elevated. Phos elevated.   Pt reports that he has a good appetite and has been eating well. Pt reports that he did not have any breakfast this am but states that he did very well with his meals this am. Per record, pt has been refusing supplements. RD will discontinue supplements. RD will continue to monitor PO intakes.   NUTRITION - FOCUSED PHYSICAL EXAM:  WDL - no wasting noted.  Diet Order:   Diet Order             Diet 2 gram sodium Room service appropriate? Yes with Assist; Fluid consistency: Thin; Fluid restriction: 1200 mL Fluid  Diet effective now                   EDUCATION NEEDS:   Not appropriate for education at this time  Skin:  Skin Assessment: Reviewed RN Assessment  Last BM:  8/19  Height:   Ht Readings from Last 1 Encounters:  01/19/23 6\' 1"  (1.854 m)    Weight:   Wt Readings from Last 1 Encounters:  02/05/23 100.6 kg    Ideal Body Weight:  83.6 kg  BMI:  Body mass index is 29.26 kg/m.  Estimated Nutritional Needs:   Kcal:  2200-2500 kcal/d  Protein:  110-130g/d  Fluid:  1L + UOP  Melessia Kaus [place], RD, LDN, CNSC.

## 2023-02-05 NOTE — Progress Notes (Signed)
  [facility] Progress Note   Subjective:  Seen walking the halls with PT - getting stronger. No concerns today - SNF authorization pending per notes. Denies CP/dyspnea.   Objective Vitals:   02/04/23 2028 02/05/23 0500 02/05/23 0523 02/05/23 0808  BP: 91/65  (!) 83/59 99/63  Pulse: 92  76 72  Resp: 18  18 17   Temp: 98.4 F (36.9 C)  98.1 F (36.7 C) (!) 97.3 F (36.3 C)  TempSrc: Oral  Oral Oral  SpO2: 97%  97% 97%  Weight:  100.6 kg    Height:       Physical Exam General: Well appearing man, NAD. Room air. Heart: RRR; no murmur Lungs: CTAB, no rales Abdomen: soft Extremities: no LE edema Dialysis Access:  [facility]  Additional Objective Labs: Basic Metabolic Panel: Recent Labs  Lab 01/30/23 0648 02/01/23 1010 02/04/23 1101  NA 135 137 133*  K 5.2* 4.0 4.5  CL 92* 94* 92*  CO2 24 24 22   GLUCOSE 86 115* 96  BUN 67* 65* 70*  CREATININE 15.18* 14.64* 16.55*  CALCIUM 8.9 8.7* 8.9  PHOS 10.7* 9.5* 8.2*   Liver Function Tests: Recent Labs  Lab 01/30/23 0648 02/01/23 1010 02/04/23 1101  AST 22  --   --   ALT 19  --   --   ALKPHOS 85  --   --   BILITOT 0.7  --   --   PROT 8.6*  --   --   ALBUMIN 4.0 3.9 3.8   CBC: Recent Labs  Lab 01/30/23 0648 02/01/23 0930 02/04/23 1101  WBC 7.6 6.3 6.7  NEUTROABS 4.7  --   --   HGB 12.8* 14.0 13.6  HCT 39.6 41.9 41.0  MCV 94.5 95.0 94.3  PLT 116* 111* 145*   Medications:  sodium chloride 10 mL/hr at 01/23/23 1400    atorvastatin  40 mg Oral Daily   Chlorhexidine Gluconate Cloth  6 each Topical Q0600   cinacalcet  60 mg Oral Q supper   levETIRAcetam  500 mg Oral BID   midodrine  5 mg Oral TID WC   multivitamin  1 tablet Oral QHS   sevelamer carbonate  2,400 mg Oral TID WC   warfarin  5 mg Oral ONCE-1600   Warfarin - Pharmacist Dosing Inpatient   Does not apply q1600    Dialysis Orders: [facility] MWF 4h   400/800 112.8kg  2/2 bath  TDC  Heparin none - Mircera 120 mcg IV q 2 weeks (last dose  01/16/2023) - Venofer 50 mg IV q week   Assessment/Plan: Seizures: Had not been taking OP meds. Found down at [employer]. Improved now. Acute CVA per MRI done 8/10 w/ cerebellar CVA w/o hemorrhage.  Recommendation was to switch Eliquis to warfarin -> in progress. FYI - dialysis clinic will NOT manage his INR, needs PCP to do this. Acute hypoxic respiratory failure: Pulm edema + possible aspiration PNA on admit, better now. BP/volume: BP controlled, now well below prior EDW - will lower on d/c (~101kg). Amlod on hold. ESRD: Continue HD on usual MWF schedule - HD tomorrow (8/21) Anemia: Hgb down to 7's earlier this admit - now > 12, ESA on hold. Secondary HPTH: Ca ok, Phos high btu improving. Continue sevelamer, sensipar 60mg  every day.  Nutrition: Alb slightly low, continue renal/high protein diet  Ozzie Hoyle, PA-C 02/05/2023, 12:16 PM  [employer]

## 2023-02-05 NOTE — Plan of Care (Signed)
Problem: Education: Goal: Ability to describe self-care measures that may prevent or decrease complications (Diabetes Survival Skills Education) will improve Outcome: Progressing Goal: Individualized Educational Video(s) Outcome: Progressing   Problem: Coping: Goal: Ability to adjust to condition or change in health will improve Outcome: Progressing   Problem: Fluid Volume: Goal: Ability to maintain a balanced intake and output will improve Outcome: Progressing   Problem: Health Behavior/Discharge Planning: Goal: Ability to identify and utilize available resources and services will improve Outcome: Progressing Goal: Ability to manage health-related needs will improve Outcome: Progressing   Problem: Metabolic: Goal: Ability to maintain appropriate glucose levels will improve Outcome: Progressing   Problem: Nutritional: Goal: Maintenance of adequate nutrition will improve Outcome: Progressing Goal: Progress toward achieving an optimal weight will improve Outcome: Progressing   Problem: Skin Integrity: Goal: Risk for impaired skin integrity will decrease Outcome: Progressing   Problem: Tissue Perfusion: Goal: Adequacy of tissue perfusion will improve Outcome: Progressing   Problem: Education: Goal: Knowledge of General Education information will improve Description: Including pain rating scale, medication(s)/side effects and non-pharmacologic comfort measures Outcome: Progressing   Problem: Health Behavior/Discharge Planning: Goal: Ability to manage health-related needs will improve Outcome: Progressing   Problem: Clinical Measurements: Goal: Ability to maintain clinical measurements within normal limits will improve Outcome: Progressing Goal: Will remain free from infection Outcome: Progressing Goal: Diagnostic test results will improve Outcome: Progressing Goal: Respiratory complications will improve Outcome: Progressing Goal: Cardiovascular complication will  be avoided Outcome: Progressing   Problem: Activity: Goal: Risk for activity intolerance will decrease Outcome: Progressing   Problem: Nutrition: Goal: Adequate nutrition will be maintained Outcome: Progressing   Problem: Coping: Goal: Level of anxiety will decrease Outcome: Progressing   Problem: Elimination: Goal: Will not experience complications related to bowel motility Outcome: Progressing Goal: Will not experience complications related to urinary retention Outcome: Progressing   Problem: Pain Managment: Goal: General experience of comfort will improve Outcome: Progressing   Problem: Safety: Goal: Ability to remain free from injury will improve Outcome: Progressing   Problem: Skin Integrity: Goal: Risk for impaired skin integrity will decrease Outcome: Progressing   Problem: Education: Goal: Knowledge of disease or condition will improve Outcome: Progressing Goal: Knowledge of secondary prevention will improve (MUST DOCUMENT ALL) Outcome: Progressing Goal: Knowledge of patient specific risk factors will improve Loraine Leriche N/A or DELETE if not current risk factor) Outcome: Progressing   Problem: Ischemic Stroke/TIA Tissue Perfusion: Goal: Complications of ischemic stroke/TIA will be minimized Outcome: Progressing   Problem: Coping: Goal: Will verbalize positive feelings about self Outcome: Progressing Goal: Will identify appropriate support needs Outcome: Progressing   Problem: Health Behavior/Discharge Planning: Goal: Ability to manage health-related needs will improve Outcome: Progressing Goal: Goals will be collaboratively established with patient/family Outcome: Progressing   Problem: Self-Care: Goal: Ability to participate in self-care as condition permits will improve Outcome: Progressing Goal: Verbalization of feelings and concerns over difficulty with self-care will improve Outcome: Progressing Goal: Ability to communicate needs accurately will  improve Outcome: Progressing   Problem: Nutrition: Goal: Risk of aspiration will decrease Outcome: Progressing Goal: Dietary intake will improve Outcome: Progressing   Problem: Education: Goal: Knowledge of disease or condition will improve Outcome: Progressing Goal: Knowledge of secondary prevention will improve (MUST DOCUMENT ALL) Outcome: Progressing Goal: Knowledge of patient specific risk factors will improve Loraine Leriche N/A or DELETE if not current risk factor) Outcome: Progressing   Problem: Ischemic Stroke/TIA Tissue Perfusion: Goal: Complications of ischemic stroke/TIA will be minimized Outcome: Progressing   Problem: Coping: Goal: Will  verbalize positive feelings about self Outcome: Progressing Goal: Will identify appropriate support needs Outcome: Progressing   Problem: Health Behavior/Discharge Planning: Goal: Ability to manage health-related needs will improve Outcome: Progressing Goal: Goals will be collaboratively established with patient/family Outcome: Progressing   Problem: Self-Care: Goal: Ability to participate in self-care as condition permits will improve Outcome: Progressing Goal: Verbalization of feelings and concerns over difficulty with self-care will improve Outcome: Progressing Goal: Ability to communicate needs accurately will improve Outcome: Progressing   Problem: Nutrition: Goal: Risk of aspiration will decrease Outcome: Progressing Goal: Dietary intake will improve Outcome: Progressing
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
## 2023-02-05 NOTE — Progress Notes (Signed)
ANTICOAGULATION CONSULT NOTE - Follow Up Consult  Pharmacy Consult for Warfarin  Indication:  antiphospholipid syndrome  Allergies  Allergen Reactions   Cetirizine & Related Swelling    Patient Measurements: Height: 6\' 1"  (185.4 cm) Weight: 100.6 kg (221 lb 12.5 oz) IBW/kg (Calculated) : 79.9 Enoxparin Dosing Weight: 102 kg  Vital Signs: Temp: 97.3 F (36.3 C) (08/20 0808) Temp Source: Oral (08/20 0808) BP: 99/63 (08/20 0808) Pulse Rate: 72 (08/20 0808)  Labs: Recent Labs    02/03/23 0539 02/04/23 1101 02/05/23 0413  HGB  --  13.6  --   HCT  --  41.0  --   PLT  --  145*  --   LABPROT 23.1*  --  25.7*  INR 2.0*  --  2.3*  CREATININE  --  16.55*  --    ESRD  Assessment: 41yoM with hx of lupus anticoagulant/antiphospholipid syndrome and was supposed to be on apixaban prior to admission, but last picked up Dec 2023 for 90 day supply. Pharmacy initially consulted to restart apixaban on 01/27/23. Pharmacy later consulted to transition to full-dose enoxaparin, last dose of apixaban 01/28/23.   ESRD with usual MWF HD. Discussed with Drs. Lowell Guitar and Pearlean Brownie, who are aware of increased risk of bleeding with Enoxaparin and ESRD due to potential accumulation of active heparin metabolites that are undetected by anti-Xa levels. They would like to proceed with Enoxaparin for now.     Decision made to change long-term anticoagulation to warfarin on 8/13. Bridging with Lovenox. Chronic thrombocytopenia, stable. Hemoglobin trended up to 14.0 on 8/16. Not currently on any interacting medications.  INR 2.3 today. Neph doesn't think that coumadin is a good option if pt is discharge without permanent facility like SNF since he a very poor hx of adherence.  Goal of Therapy:  INR 2-3 Monitor platelets by anticoagulation protocol: Yes   Plan:  Warfarin 5 mg x 1 today Daily PT/INR CBC at least q72hrs. Timing w/ HD labs. Monitor for signs/symptoms of bleeding.  Ulyses Southward, PharmD, BCIDP,  AAHIVP, CPP Infectious Disease Pharmacist 02/05/2023 9:36 AM
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
## 2023-02-06 DIAGNOSIS — R569 Unspecified convulsions: Secondary | ICD-10-CM | POA: Diagnosis not present

## 2023-02-06 LAB — CBC
HCT: 38.3 % — ABNORMAL LOW (ref 39.0–52.0)
Hemoglobin: 12.4 g/dL — ABNORMAL LOW (ref 13.0–17.0)
MCH: 30.5 pg (ref 26.0–34.0)
MCHC: 32.4 g/dL (ref 30.0–36.0)
MCV: 94.1 fL (ref 80.0–100.0)
Platelets: 144 10*3/uL — ABNORMAL LOW (ref 150–400)
RBC: 4.07 MIL/uL — ABNORMAL LOW (ref 4.22–5.81)
RDW: 14.7 % (ref 11.5–15.5)
WBC: 6.2 10*3/uL (ref 4.0–10.5)
nRBC: 0 % (ref 0.0–0.2)

## 2023-02-06 LAB — RENAL FUNCTION PANEL
Albumin: 3.5 g/dL (ref 3.5–5.0)
Anion gap: 17 — ABNORMAL HIGH (ref 5–15)
BUN: 71 mg/dL — ABNORMAL HIGH (ref 6–20)
CO2: 22 mmol/L (ref 22–32)
Calcium: 8.6 mg/dL — ABNORMAL LOW (ref 8.9–10.3)
Chloride: 94 mmol/L — ABNORMAL LOW (ref 98–111)
Creatinine, Ser: 16.22 mg/dL — ABNORMAL HIGH (ref 0.61–1.24)
GFR, Estimated: 3 mL/min — ABNORMAL LOW (ref >60)
Glucose, Bld: 84 mg/dL (ref 70–99)
Phosphorus: 7.9 mg/dL — ABNORMAL HIGH (ref 2.5–4.6)
Potassium: 4.8 mmol/L (ref 3.5–5.1)
Sodium: 133 mmol/L — ABNORMAL LOW (ref 135–145)

## 2023-02-06 LAB — PROTIME-INR
INR: 2.4 — ABNORMAL HIGH (ref 0.8–1.2)
Prothrombin Time: 26.2 s — ABNORMAL HIGH (ref 11.4–15.2)

## 2023-02-06 MED ORDER — HEPARIN SODIUM (PORCINE) 1000 UNIT/ML IJ SOLN
INTRAMUSCULAR | Status: AC
  Administered 2023-02-06: 1000 [IU]
  Filled 2023-02-06: qty 4

## 2023-02-06 MED ORDER — MELATONIN 3 MG PO TABS
3.0000 mg | ORAL_TABLET | Freq: Every evening | ORAL | Status: DC | PRN
  Administered 2023-02-06 – 2023-02-12 (×4): 3 mg via ORAL
  Filled 2023-02-06 (×4): qty 1

## 2023-02-06 MED ORDER — WARFARIN SODIUM 5 MG PO TABS
5.0000 mg | ORAL_TABLET | Freq: Once | ORAL | Status: AC
  Administered 2023-02-06: 5 mg via ORAL
  Filled 2023-02-06: qty 1

## 2023-02-06 NOTE — Progress Notes (Signed)
   02/06/23 1309  Vitals  Temp 98 F (36.7 C)  Temp Source Oral  BP 110/80  BP Location Right Arm  BP Method Automatic  Patient Position (if appropriate) Lying  Pulse Rate 80  Resp 16  Oxygen Therapy  SpO2 98 %  O2 Device Room Air  During Treatment Monitoring  Intra-Hemodialysis Comments Tx completed  Post Treatment  Dialyzer Clearance Lightly streaked  Hemodialysis Intake (mL) 0 mL  Liters Processed 72  Fluid Removed (mL) 1500 mL  Tolerated HD Treatment Yes  Post-Hemodialysis Comments goal met  Hemodialysis Catheter Right Double lumen Permanent (Tunneled)  Placement Date/Time: (c) 01/19/23 0106   Placed prior to admission: Yes  Person Inserting LDA: Wynona Luna, RN  Orientation: Right  Hemodialysis Catheter Type: Double lumen Permanent (Tunneled)  Site Condition No complications  Blue Lumen Status Heparin locked  Red Lumen Status Heparin locked  Catheter fill solution Heparin 1000 units/ml  Catheter fill volume (Arterial) 1.6 cc  Catheter fill volume (Venous) 1.6  Dressing Type Transparent  Dressing Status Antimicrobial disc in place  Interventions Dressing changed  Drainage Description None  Dressing Change Due 02/13/23  Post treatment catheter status Capped and Clamped

## 2023-02-06 NOTE — Progress Notes (Signed)
ANTICOAGULATION CONSULT NOTE  Pharmacy Consult for Warfarin  Indication:  antiphospholipid syndrome  Allergies  Allergen Reactions   Cetirizine & Related Swelling    Patient Measurements: Height: 6\' 1"  (185.4 cm) Weight: 99.5 kg (219 lb 5.7 oz) IBW/kg (Calculated) : 79.9 Enoxparin Dosing Weight: 102 kg  Vital Signs: Temp: 98 F (36.7 C) (08/21 1700) Temp Source: Oral (08/21 1700) BP: 96/56 (08/21 1700) Pulse Rate: 79 (08/21 1700)  Labs: Recent Labs    02/04/23 1101 02/05/23 0413 02/06/23 1006 02/06/23 1007 02/06/23 1612  HGB 13.6  --  12.4*  --   --   HCT 41.0  --  38.3*  --   --   PLT 145*  --  144*  --   --   LABPROT  --  25.7*  --   --  26.2*  INR  --  2.3*  --   --  2.4*  CREATININE 16.55*  --   --  16.22*  --    ESRD  Assessment: 41yoM with hx of lupus anticoagulant/antiphospholipid syndrome and was supposed to be on apixaban prior to admission, but last picked up Dec 2023 for 90 day supply. Pharmacy initially consulted to restart apixaban on 01/27/23. Pharmacy later consulted to transition to full-dose enoxaparin, last dose of apixaban 01/28/23.   ESRD with usual MWF HD. Discussed with Drs. Lowell Guitar and Pearlean Brownie, who are aware of increased risk of bleeding with Enoxaparin and ESRD due to potential accumulation of active heparin metabolites that are undetected by anti-Xa levels. They would like to proceed with Enoxaparin for now.     Decision made to change long-term anticoagulation to warfarin on 8/13. Bridging with Lovenox. Chronic thrombocytopenia, stable. Not currently on any interacting medications. Hemoglobin stable up at 12 8/21. INR therapeutic 2.4 on warfarin 5mg  daily.   Pt refused labs twice 8/21 AM. Neph doesn't think that coumadin is a good option if pt is discharge without permanent facility like SNF since he a very poor hx of adherence. MD is aware.   Goal of Therapy:  INR 2-3 Monitor platelets by anticoagulation protocol: Yes   Plan:  Give warfarin  5 mg PO x1 dose Check INR daily while on warfarin Continue to monitor H&H and platelets  Thank you for allowing pharmacy to be a part of this patient's care.  Thelma Barge, PharmD Clinical Pharmacist

## 2023-02-06 NOTE — Progress Notes (Signed)
Bedside nurse reported that patient declining lab draws and removed his IV access.

## 2023-02-06 NOTE — Plan of Care (Signed)
 Problem: Education: Goal: Ability to describe self-care measures that may prevent or decrease complications (Diabetes Survival Skills Education) will improve Outcome: Progressing Goal: Individualized Educational Video(s) Outcome: Progressing   Problem: Coping: Goal: Ability to adjust to condition or change in health will improve Outcome: Progressing   Problem: Fluid Volume: Goal: Ability to maintain a balanced intake and output will improve Outcome: Progressing   Problem: Health Behavior/Discharge Planning: Goal: Ability to identify and utilize available resources and services will improve Outcome: Progressing Goal: Ability to manage health-related needs will improve Outcome: Progressing   Problem: Metabolic: Goal: Ability to maintain appropriate glucose levels will improve Outcome: Progressing   Problem: Nutritional: Goal: Maintenance of adequate nutrition will improve Outcome: Progressing Goal: Progress toward achieving an optimal weight will improve Outcome: Progressing   Problem: Skin Integrity: Goal: Risk for impaired skin integrity will decrease Outcome: Progressing   Problem: Tissue Perfusion: Goal: Adequacy of tissue perfusion will improve Outcome: Progressing   Problem: Education: Goal: Knowledge of General Education information will improve Description: Including pain rating scale, medication(s)/side effects and non-pharmacologic comfort measures Outcome: Progressing   Problem: Health Behavior/Discharge Planning: Goal: Ability to manage health-related needs will improve Outcome: Progressing   Problem: Clinical Measurements: Goal: Ability to maintain clinical measurements within normal limits will improve Outcome: Progressing Goal: Will remain free from infection Outcome: Progressing Goal: Diagnostic test results will improve Outcome: Progressing Goal: Respiratory complications will improve Outcome: Progressing Goal: Cardiovascular complication will  be avoided Outcome: Progressing   Problem: Activity: Goal: Risk for activity intolerance will decrease Outcome: Progressing   Problem: Nutrition: Goal: Adequate nutrition will be maintained Outcome: Progressing   Problem: Coping: Goal: Level of anxiety will decrease Outcome: Progressing   Problem: Elimination: Goal: Will not experience complications related to bowel motility Outcome: Progressing Goal: Will not experience complications related to urinary retention Outcome: Progressing   Problem: Pain Managment: Goal: General experience of comfort will improve Outcome: Progressing   Problem: Safety: Goal: Ability to remain free from injury will improve Outcome: Progressing   Problem: Skin Integrity: Goal: Risk for impaired skin integrity will decrease Outcome: Progressing   Problem: Education: Goal: Knowledge of disease or condition will improve Outcome: Progressing Goal: Knowledge of secondary prevention will improve (MUST DOCUMENT ALL) Outcome: Progressing Goal: Knowledge of patient specific risk factors will improve Loraine Leriche N/A or DELETE if not current risk factor) Outcome: Progressing   Problem: Ischemic Stroke/TIA Tissue Perfusion: Goal: Complications of ischemic stroke/TIA will be minimized Outcome: Progressing   Problem: Coping: Goal: Will verbalize positive feelings about self Outcome: Progressing Goal: Will identify appropriate support needs Outcome: Progressing   Problem: Health Behavior/Discharge Planning: Goal: Ability to manage health-related needs will improve Outcome: Progressing Goal: Goals will be collaboratively established with patient/family Outcome: Progressing   Problem: Self-Care: Goal: Ability to participate in self-care as condition permits will improve Outcome: Progressing Goal: Verbalization of feelings and concerns over difficulty with self-care will improve Outcome: Progressing Goal: Ability to communicate needs accurately will  improve Outcome: Progressing   Problem: Nutrition: Goal: Risk of aspiration will decrease Outcome: Progressing Goal: Dietary intake will improve Outcome: Progressing   Problem: Education: Goal: Knowledge of disease or condition will improve Outcome: Progressing Goal: Knowledge of secondary prevention will improve (MUST DOCUMENT ALL) Outcome: Progressing Goal: Knowledge of patient specific risk factors will improve Loraine Leriche N/A or DELETE if not current risk factor) Outcome: Progressing   Problem: Ischemic Stroke/TIA Tissue Perfusion: Goal: Complications of ischemic stroke/TIA will be minimized Outcome: Progressing   Problem: Coping: Goal: Will  verbalize positive feelings about self Outcome: Progressing Goal: Will identify appropriate support needs Outcome: Progressing   Problem: Health Behavior/Discharge Planning: Goal: Ability to manage health-related needs will improve Outcome: Progressing Goal: Goals will be collaboratively established with patient/family Outcome: Progressing

## 2023-02-06 NOTE — Progress Notes (Signed)
ANTICOAGULATION CONSULT NOTE - Follow Up Consult  Pharmacy Consult for Warfarin  Indication:  antiphospholipid syndrome  Allergies  Allergen Reactions   Cetirizine & Related Swelling    Patient Measurements: Height: 6\' 1"  (185.4 cm) Weight: 100.6 kg (221 lb 12.5 oz) IBW/kg (Calculated) : 79.9 Enoxparin Dosing Weight: 102 kg  Vital Signs: Temp: 97.8 F (36.6 C) (08/21 0758) BP: 105/76 (08/21 0758) Pulse Rate: 69 (08/21 0758)  Labs: Recent Labs    02/04/23 1101 02/05/23 0413  HGB 13.6  --   HCT 41.0  --   PLT 145*  --   LABPROT  --  25.7*  INR  --  2.3*  CREATININE 16.55*  --    ESRD  Assessment: 41yoM with hx of lupus anticoagulant/antiphospholipid syndrome and was supposed to be on apixaban prior to admission, but last picked up Dec 2023 for 90 day supply. Pharmacy initially consulted to restart apixaban on 01/27/23. Pharmacy later consulted to transition to full-dose enoxaparin, last dose of apixaban 01/28/23.   ESRD with usual MWF HD. Discussed with Drs. Lowell Guitar and Pearlean Brownie, who are aware of increased risk of bleeding with Enoxaparin and ESRD due to potential accumulation of active heparin metabolites that are undetected by anti-Xa levels. They would like to proceed with Enoxaparin for now.     Decision made to change long-term anticoagulation to warfarin on 8/13. Bridging with Lovenox. Chronic thrombocytopenia, stable. Hemoglobin trended up to 14.0 on 8/16. Not currently on any interacting medications.  Pt refused labs twice this AM. Neph doesn't think that coumadin is a good option if pt is discharge without permanent facility like SNF since he a very poor hx of adherence. MD is aware.   Goal of Therapy:  INR 2-3 Monitor platelets by anticoagulation protocol: Yes   Plan:  F/u labs before coumadin Daily PT/INR  Ulyses Southward, PharmD, BCIDP, AAHIVP, CPP Infectious Disease Pharmacist 02/06/2023 9:30 AM

## 2023-02-06 NOTE — Progress Notes (Signed)
[facility] Progress Note  Patient: [name] [name]    GLO:[id number]  DOA: 01/18/2023     Date of Service: the patient was seen and examined on 02/06/2023  Chief Complaint  Patient presents with   Seizures   Brief hospital course: [name] is a 42 y.o. 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  Suspected aspiration event.  He required intubation.   Significant Events: 8/2 admitted after seizure with resultant hypoxic respiratory failure -intubation 8/3 HD initiated for volume overload -stopped due to hypotension 8/3 EEG noted diffuse slowing but no seizure or epileptogenicity 8/5 extubated -found to have right arm weakness/unable to lift right arm above shoulder 8/6 TRH assumed care - transferred to Med/Surg bed  8/10 MRI with acute stroke  8/13 pending SNF placement   Assessment and Plan:  Acute CVA MRI brain with acute infarct left PCA territory, multiple small foci of acute ischemia left cerebellar hemisphere, single focus of acute ischemia right cerebellum.  MRA head with no acute findings.  Carotid ultrasound with 1-39% stenosis bilaterally.  TTE with LVEF 55-60%, no regional wall motion normalities, severe LV H, mildly reduced RV systolic function, mild/moderate MV regurgitation.  LDL 105, hemoglobin A1c 5.2.  Etiology likely related to hypercoagulable disorder from SLE/antiphospholipid antibody versus cardiogenic embolism.  Neurology was consulted and followed during hospital course.  Recommending switching Eliquis to to Lovenox, although per pharmacy not a great recommendation given his ESRD status.  Will continue Lovenox bridge and initiated on warfarin; with goal INR 2-3.Marland Kitchen  Recommend to avoid driving until  peripheral vision loss improves. -- Warfarin, pharmacy consulted for dosing/monitoring -- INR daily; goal 2-3; labs pending today   Seizure Patient presenting to ED after sustaining seizure.  Etiology likely poor compliance with home AEDs. -- Keppra 500 mg p.o. twice daily -- Seizure precautions   Per [facility] statutes, patients with seizures are not allowed to drive until they have been seizure-free for six months and cleared by a physician. Use caution when using heavy equipment or power tools. Avoid working on ladders or at heights. Take showers instead of baths. Ensure the water temperature is not too high on the home water heater. Do not go swimming alone. Do not lock yourself in a room alone (i.e. bathroom). When caring for infants or small children, sit down when holding, feeding, or changing them to minimize risk of injury to the child in the event you have a seizure. Maintain good sleep hygiene. Avoid alcohol.    Compressive cervical myelopathy MR C-spine with C5-6 herniated disc, spinal stenosis and cervical myopathy.  Seen by neurosurgery, Dr. Lovell Sheehan on 01/23/2023 with recommendation of a C5-6 anterior cervicectomy fusion and plating; plans for outpatient follow-up in 2 weeks.   Acute hypoxic respiratory failure Aspiration pneumonitis Patient likely suffered aspiration event following seizure.  Completed 6-day course of IV antibiotics.   ESRD on HD MWF -- Nephrology following, appreciate assistance   Shock secondary to sepsis versus sedation induced Required transient use of vasopressors during mechanical ventilation, now resolved.  Completed 6-day course of IV antibiotics.   Metabolic encephalopathy Etiology likely secondary to seizure activity and hypoxia.   -- Delirium precautions   SLE Anti-Phospholipid syndrome Currently not followed by rheumatology.  Last  seen in 2022.  On anticoagulation as above.  Needs to reestablish care on discharge.   Thrombocytopenia,  chronic Platelet count 116, stable.   Anemia of chronic medical/renal disease Hemoglobin 12.8, stable.   Essential hypertension Autonomic dysfunction -- Discontinued carvedilol given hypotension -- Started midodrine 5 mg p.o. TID -- Continue to monitor BP off of antihypertensives   Hyperlipidemia -- Atorvastatin 40 mg p.o. daily   Weakness/debility/deconditioning: -- Pending insurance authorization for SNF placement  Body mass index is 28.94 kg/m.  Nutrition Problem: Inadequate oral intake Etiology: inability to eat Interventions: Interventions: Refer to RD note for recommendations  Diet: 2 g sodium diet/fluid restriction 1200 mL/day DVT Prophylaxis: Therapeutic Anticoagulation with Coumadin    Advance goals of care discussion: Full code  Family Communication: family was not present at bedside, at the time of interview.  The pt provided permission to discuss medical plan with the family. Opportunity was given to ask question and all questions were answered satisfactorily.   Disposition:  Level of care: Med-Surg Status is: Inpatient Remains inpatient appropriate because: Medically stable for discharge to SNF once bed available and receives insurance authorization   Consultants:  Neurology PCCM Nephrology   Procedures:  Intubation 8/2 Extubation 8/5 EEG   Antimicrobials:  Vancomycin 8/3 - 8/3 Metronidazole 8/3 - 8/3 Cefepime/30-8/3 Unasyn 8/2 - 8/7   Subjective: No significant events overnight, patient was seen after hemodialysis, tolerated well.  Patient was laying comfortably, no any complaint. Patient did refuse blood test, INR monitoring in the morning, patient was educated and then he agreed for the INR.  Physical Exam: General: NAD, lying comfortably Appear in no distress, affect appropriate Eyes: PERRLA ENT: Oral Mucosa Clear, moist  Neck: no JVD,  Cardiovascular: S1 and S2 Present, no Murmur,  Respiratory: good respiratory effort, Bilateral Air  entry equal and Decreased, no Crackles, no wheezes Abdomen: Bowel Sound present, Soft and no tenderness,  Skin: no rashes Extremities: no Pedal edema, no calf tenderness Neurologic: without any new focal findings Gait not checked due to patient safety concerns  Vitals:   02/06/23 1130 02/06/23 1200 02/06/23 1309 02/06/23 1312  BP: 117/70 (!) 118/97 110/80   Pulse: 80 72 80   Resp: 18 18 16    Temp:   98 F (36.7 C)   TempSrc:   Oral   SpO2:   98%   Weight:    99.5 kg  Height:        Intake/Output Summary (Last 24 hours) at 02/06/2023 1641 Last data filed at 02/06/2023 1309 Gross per 24 hour  Intake 120 ml  Output 1500 ml  Net -1380 ml   Filed Weights   02/05/23 0500 02/06/23 1009 02/06/23 1312  Weight: 100.6 kg 101 kg 99.5 kg    Data Reviewed: I have personally reviewed and interpreted daily labs, tele strips, imagings as discussed above. I reviewed all nursing notes, pharmacy notes, vitals, pertinent old records I have discussed plan of care as described above with RN and patient/family.  CBC: Recent Labs  Lab 02/01/23 0930 02/04/23 1101 02/06/23 1006  WBC 6.3 6.7 6.2  HGB 14.0 13.6 12.4*  HCT 41.9 41.0 38.3*  MCV 95.0 94.3 94.1  PLT 111* 145* 144*   Basic Metabolic Panel: Recent Labs  Lab 02/01/23 1010 02/04/23 1101 02/06/23 1007  NA 137 133* 133*  K 4.0 4.5 4.8  CL 94* 92* 94*  CO2 24 22 22   GLUCOSE 115* 96 84  BUN 65* 70* 71*  CREATININE 14.64* 16.55* 16.22*  CALCIUM 8.7* 8.9 8.6*  PHOS 9.5* 8.2* 7.9*    Studies: No results found.  Scheduled Meds:  atorvastatin  40 mg Oral Daily   Chlorhexidine Gluconate Cloth  6 each Topical Q0600   cinacalcet  60 mg Oral Q supper   levETIRAcetam  500 mg Oral BID   midodrine  5 mg Oral TID WC   multivitamin  1 tablet Oral QHS   sevelamer carbonate  2,400 mg Oral TID WC   Warfarin - Pharmacist Dosing Inpatient   Does not apply q1600   Continuous Infusions:  sodium chloride 10 mL/hr at 01/23/23 1400    PRN Meds: acetaminophen, diphenhydrAMINE, docusate sodium, LORazepam, ondansetron (ZOFRAN) IV, mouth rinse, polyethylene glycol  Time spent: 35 minutes  Author: Gillis Santa. MD [facility] 02/06/2023 4:41 PM  To reach On-call, see care teams to locate the attending and reach out to them via [email or web address]. If 7PM-7AM, please contact night-coverage If you still have difficulty reaching the attending provider, please page the [facility] (Director on Call) for [facility] on amion for assistance.

## 2023-02-06 NOTE — Progress Notes (Signed)
  [facility] Progress Note   Subjective:  Seen on HD - 2L UFG and tolerated. No CP/dyspnea. SNF authorization pending - he says he doesn't think he needs this anymore - rec to d/w primary team.  Objective Vitals:   02/06/23 1009 02/06/23 1030 02/06/23 1100 02/06/23 1130  BP:  (!) 108/90 106/80 117/70  Pulse:  60 62 80  Resp:  18 18 18   Temp:      TempSrc:      SpO2:      Weight: 101 kg     Height:       Physical Exam General: Well appearing man, NAD. Room air. Heart: RRR; no murmur Lungs: CTAB, no rales Abdomen: soft Extremities: no LE edema Dialysis Access:  [facility]  Additional Objective Labs: Basic Metabolic Panel: Recent Labs  Lab 02/01/23 1010 02/04/23 1101 02/06/23 1007  NA 137 133* 133*  K 4.0 4.5 4.8  CL 94* 92* 94*  CO2 24 22 22   GLUCOSE 115* 96 84  BUN 65* 70* 71*  CREATININE 14.64* 16.55* 16.22*  CALCIUM 8.7* 8.9 8.6*  PHOS 9.5* 8.2* 7.9*   Liver Function Tests: Recent Labs  Lab 02/01/23 1010 02/04/23 1101 02/06/23 1007  ALBUMIN 3.9 3.8 3.5   CBC: Recent Labs  Lab 02/01/23 0930 02/04/23 1101 02/06/23 1006  WBC 6.3 6.7 6.2  HGB 14.0 13.6 12.4*  HCT 41.9 41.0 38.3*  MCV 95.0 94.3 94.1  PLT 111* 145* 144*   Medications:  sodium chloride 10 mL/hr at 01/23/23 1400    atorvastatin  40 mg Oral Daily   Chlorhexidine Gluconate Cloth  6 each Topical Q0600   cinacalcet  60 mg Oral Q supper   heparin sodium (porcine)       levETIRAcetam  500 mg Oral BID   midodrine  5 mg Oral TID WC   multivitamin  1 tablet Oral QHS   sevelamer carbonate  2,400 mg Oral TID WC   Warfarin - Pharmacist Dosing Inpatient   Does not apply q1600    Dialysis Orders: [facility] MWF 4h   400/800 112.8kg  2/2 bath  TDC  Heparin none - Mircera 120 mcg IV q 2 weeks (last dose 01/16/2023) - Venofer 50 mg IV q week   Assessment/Plan: Seizures: Had not been taking OP meds. Found down at [employer]. Improved now. Acute CVA per MRI done 8/10 w/  cerebellar CVA w/o hemorrhage. Neuro switched Eliquis to warfarin. FYI - dialysis clinic will NOT manage his INR, needs PCP to do this. Acute hypoxic respiratory failure: Pulm edema + possible aspiration PNA on admit, better now. BP/volume: BP controlled, now well below prior EDW - will lower on d/c (~101kg). Amlod on hold. ESRD: Continue HD on usual MWF schedule - HD now, 2L UFG. Anemia: Hgb down to 7's earlier this admit - now > 12, ESA on hold. Secondary HPTH: Ca ok, Phos high but improving. Continue sevelamer, sensipar 60mg  every day.  Nutrition: Alb slightly low, continue renal/high protein diet  Ozzie Hoyle, PA-C 02/06/2023, 11:38 AM  [employer]

## 2023-02-06 NOTE — Plan of Care (Signed)
  Problem: Education: Goal: Ability to describe self-care measures that may prevent or decrease complications (Diabetes Survival Skills Education) will improve Outcome: Progressing Goal: Individualized Educational Video(s) Outcome: Progressing   Problem: Coping: Goal: Ability to adjust to condition or change in health will improve Outcome: Progressing   Problem: Fluid Volume: Goal: Ability to maintain a balanced intake and output will improve Outcome: Progressing   Problem: Health Behavior/Discharge Planning: Goal: Ability to identify and utilize available resources and services will improve Outcome: Progressing Goal: Ability to manage health-related needs will improve Outcome: Progressing   Problem: Metabolic: Goal: Ability to maintain appropriate glucose levels will improve Outcome: Progressing   Problem: Nutritional: Goal: Maintenance of adequate nutrition will improve Outcome: Progressing Goal: Progress toward achieving an optimal weight will improve Outcome: Progressing   Problem: Skin Integrity: Goal: Risk for impaired skin integrity will decrease Outcome: Progressing   Problem: Tissue Perfusion: Goal: Adequacy of tissue perfusion will improve Outcome: Progressing   Problem: Education: Goal: Knowledge of General Education information will improve Description: Including pain rating scale, medication(s)/side effects and non-pharmacologic comfort measures Outcome: Progressing   Problem: Health Behavior/Discharge Planning: Goal: Ability to manage health-related needs will improve Outcome: Progressing   Problem: Clinical Measurements: Goal: Ability to maintain clinical measurements within normal limits will improve Outcome: Progressing Goal: Will remain free from infection Outcome: Progressing Goal: Diagnostic test results will improve Outcome: Progressing Goal: Respiratory complications will improve Outcome: Progressing Goal: Cardiovascular complication will  be avoided Outcome: Progressing   Problem: Activity: Goal: Risk for activity intolerance will decrease Outcome: Progressing   Problem: Nutrition: Goal: Adequate nutrition will be maintained Outcome: Progressing   Problem: Coping: Goal: Level of anxiety will decrease Outcome: Progressing   Problem: Elimination: Goal: Will not experience complications related to bowel motility Outcome: Progressing Goal: Will not experience complications related to urinary retention Outcome: Progressing   Problem: Pain Managment: Goal: General experience of comfort will improve Outcome: Progressing   Problem: Safety: Goal: Ability to remain free from injury will improve Outcome: Progressing   Problem: Skin Integrity: Goal: Risk for impaired skin integrity will decrease Outcome: Progressing   Problem: Education: Goal: Knowledge of disease or condition will improve Outcome: Progressing Goal: Knowledge of secondary prevention will improve (MUST DOCUMENT ALL) Outcome: Progressing Goal: Knowledge of patient specific risk factors will improve (Mark N/A or DELETE if not current risk factor) Outcome: Progressing   Problem: Ischemic Stroke/TIA Tissue Perfusion: Goal: Complications of ischemic stroke/TIA will be minimized Outcome: Progressing   Problem: Coping: Goal: Will verbalize positive feelings about self Outcome: Progressing Goal: Will identify appropriate support needs Outcome: Progressing   Problem: Health Behavior/Discharge Planning: Goal: Ability to manage health-related needs will improve Outcome: Progressing Goal: Goals will be collaboratively established with patient/family Outcome: Progressing   Problem: Self-Care: Goal: Ability to participate in self-care as condition permits will improve Outcome: Progressing Goal: Verbalization of feelings and concerns over difficulty with self-care will improve Outcome: Progressing Goal: Ability to communicate needs accurately will  improve Outcome: Progressing   Problem: Nutrition: Goal: Risk of aspiration will decrease Outcome: Progressing Goal: Dietary intake will improve Outcome: Progressing   

## 2023-02-06 NOTE — TOC Progression Note (Signed)
Transition of Care [facility]) - Progression Note    Patient Details  Name: [name] [name] MRN: [id number] Date of Birth: 09/23/1980  [facility] CM/SW Contact  Triniti Gruetzmacher A [place], [place] Phone Number: 02/06/2023, 10:45 AM  Clinical Narrative:     Pt's auth for insurance still pending, going under Medical Review.  CSW informed insurance pt's barriers for discharge related to lack of supervision at home and need to rehab his ADL's so he can go back to work and manage finances.   TOC will continue to follow.   Expected Discharge Plan: OP Rehab Barriers to Discharge: No Barriers Identified  Expected Discharge Plan and Services   Discharge Planning Services: CM Consult Post Acute Care Choice: Durable Medical Equipment, Resumption of Svcs/PTA Provider Living arrangements for the past 2 months: Hotel/Motel                 DME Arranged: Walker rolling DME Agency: [employer] Date DME Agency Contacted: 01/25/23 Time DME Agency Contacted: [phone] Representative spoke with at DME Agency: Vaughan Basta, CM with [employer]             Social Determinants of Health (SDOH) Interventions SDOH Screenings   Food Insecurity: No Food Insecurity (02/03/2023)  Housing: Low Risk  (02/03/2023)  Transportation Needs: No Transportation Needs (02/03/2023)  Utilities: Not At Risk (02/03/2023)    Readmission Risk Interventions    01/25/2023    3:40 PM  Readmission Risk Prevention Plan  Post Dischage Appt Complete  Medication Screening Complete  Transportation Screening Complete

## 2023-02-07 DIAGNOSIS — R569 Unspecified convulsions: Secondary | ICD-10-CM | POA: Diagnosis not present

## 2023-02-07 LAB — PROTIME-INR
INR: 2.3 — ABNORMAL HIGH (ref 0.8–1.2)
Prothrombin Time: 25.2 s — ABNORMAL HIGH (ref 11.4–15.2)

## 2023-02-07 MED ORDER — WARFARIN SODIUM 5 MG PO TABS
5.0000 mg | ORAL_TABLET | Freq: Every day | ORAL | Status: DC
  Administered 2023-02-07 – 2023-02-10 (×4): 5 mg via ORAL
  Filled 2023-02-07 (×5): qty 1

## 2023-02-07 NOTE — TOC Progression Note (Signed)
Transition of Care [facility]) - Progression Note    Patient Details  Name: WYNDHAM RAJCHEL MRN: [id number] Date of Birth: 10-30-1980  Transition of Care [facility]) CM/SW Contact  Bartholomew Ramesh A [place], [place] Phone Number: 02/07/2023, 12:57 PM  Clinical Narrative:     CSW sent additional clinical notes for OT and Speech to [facility] transitions Portal.   Auth still pending under Medical Director review.   TOC will continue to follow.   Expected Discharge Plan: OP Rehab Barriers to Discharge: No Barriers Identified  Expected Discharge Plan and Services   Discharge Planning Services: CM Consult Post Acute Care Choice: Durable Medical Equipment, Resumption of Svcs/PTA Provider Living arrangements for the past 2 months: Hotel/Motel                 DME Arranged: Walker rolling DME Agency: [employer] Date DME Agency Contacted: 01/25/23 Time DME Agency Contacted: [phone] Representative spoke with at DME Agency: Vaughan Basta, CM with [employer]             Social Determinants of Health (SDOH) Interventions SDOH Screenings   Food Insecurity: No Food Insecurity (02/03/2023)  Housing: Low Risk  (02/03/2023)  Transportation Needs: No Transportation Needs (02/03/2023)  Utilities: Not At Risk (02/03/2023)    Readmission Risk Interventions    01/25/2023    3:40 PM  Readmission Risk Prevention Plan  Post Dischage Appt Complete  Medication Screening Complete  Transportation Screening Complete

## 2023-02-07 NOTE — Plan of Care (Signed)
 Problem: Education: Goal: Ability to describe self-care measures that may prevent or decrease complications (Diabetes Survival Skills Education) will improve Outcome: Progressing Goal: Individualized Educational Video(s) Outcome: Progressing   Problem: Coping: Goal: Ability to adjust to condition or change in health will improve Outcome: Progressing   Problem: Fluid Volume: Goal: Ability to maintain a balanced intake and output will improve Outcome: Progressing   Problem: Health Behavior/Discharge Planning: Goal: Ability to identify and utilize available resources and services will improve Outcome: Progressing Goal: Ability to manage health-related needs will improve Outcome: Progressing   Problem: Metabolic: Goal: Ability to maintain appropriate glucose levels will improve Outcome: Progressing   Problem: Nutritional: Goal: Maintenance of adequate nutrition will improve Outcome: Progressing Goal: Progress toward achieving an optimal weight will improve Outcome: Progressing   Problem: Skin Integrity: Goal: Risk for impaired skin integrity will decrease Outcome: Progressing   Problem: Tissue Perfusion: Goal: Adequacy of tissue perfusion will improve Outcome: Progressing   Problem: Education: Goal: Knowledge of General Education information will improve Description: Including pain rating scale, medication(s)/side effects and non-pharmacologic comfort measures Outcome: Progressing   Problem: Health Behavior/Discharge Planning: Goal: Ability to manage health-related needs will improve Outcome: Progressing   Problem: Clinical Measurements: Goal: Ability to maintain clinical measurements within normal limits will improve Outcome: Progressing Goal: Will remain free from infection Outcome: Progressing Goal: Diagnostic test results will improve Outcome: Progressing Goal: Respiratory complications will improve Outcome: Progressing Goal: Cardiovascular complication will  be avoided Outcome: Progressing   Problem: Activity: Goal: Risk for activity intolerance will decrease Outcome: Progressing   Problem: Nutrition: Goal: Adequate nutrition will be maintained Outcome: Progressing   Problem: Coping: Goal: Level of anxiety will decrease Outcome: Progressing   Problem: Elimination: Goal: Will not experience complications related to bowel motility Outcome: Progressing Goal: Will not experience complications related to urinary retention Outcome: Progressing   Problem: Pain Managment: Goal: General experience of comfort will improve Outcome: Progressing   Problem: Safety: Goal: Ability to remain free from injury will improve Outcome: Progressing   Problem: Skin Integrity: Goal: Risk for impaired skin integrity will decrease Outcome: Progressing   Problem: Education: Goal: Knowledge of disease or condition will improve Outcome: Progressing Goal: Knowledge of secondary prevention will improve (MUST DOCUMENT ALL) Outcome: Progressing Goal: Knowledge of patient specific risk factors will improve Loraine Leriche N/A or DELETE if not current risk factor) Outcome: Progressing   Problem: Ischemic Stroke/TIA Tissue Perfusion: Goal: Complications of ischemic stroke/TIA will be minimized Outcome: Progressing   Problem: Coping: Goal: Will verbalize positive feelings about self Outcome: Progressing Goal: Will identify appropriate support needs Outcome: Progressing   Problem: Health Behavior/Discharge Planning: Goal: Ability to manage health-related needs will improve Outcome: Progressing Goal: Goals will be collaboratively established with patient/family Outcome: Progressing   Problem: Self-Care: Goal: Ability to participate in self-care as condition permits will improve Outcome: Progressing Goal: Verbalization of feelings and concerns over difficulty with self-care will improve Outcome: Progressing Goal: Ability to communicate needs accurately will  improve Outcome: Progressing   Problem: Nutrition: Goal: Risk of aspiration will decrease Outcome: Progressing Goal: Dietary intake will improve Outcome: Progressing   Problem: Education: Goal: Knowledge of disease or condition will improve Outcome: Progressing Goal: Knowledge of secondary prevention will improve (MUST DOCUMENT ALL) Outcome: Progressing Goal: Knowledge of patient specific risk factors will improve Loraine Leriche N/A or DELETE if not current risk factor) Outcome: Progressing   Problem: Ischemic Stroke/TIA Tissue Perfusion: Goal: Complications of ischemic stroke/TIA will be minimized Outcome: Progressing   Problem: Coping: Goal: Will  verbalize positive feelings about self Outcome: Progressing Goal: Will identify appropriate support needs Outcome: Progressing   Problem: Health Behavior/Discharge Planning: Goal: Ability to manage health-related needs will improve Outcome: Progressing Goal: Goals will be collaboratively established with patient/family Outcome: Progressing
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
## 2023-02-07 NOTE — Progress Notes (Signed)
ANTICOAGULATION CONSULT NOTE  Pharmacy Consult for Warfarin  Indication:  antiphospholipid syndrome  Allergies  Allergen Reactions   Cetirizine & Related Swelling    Patient Measurements: Height: 6\' 1"  (185.4 cm) Weight: 101.9 kg (224 lb 10.4 oz) IBW/kg (Calculated) : 79.9 Enoxparin Dosing Weight: 102 kg  Vital Signs: Temp: 97.5 F (36.4 C) (08/22 0403) Temp Source: Oral (08/22 0403) BP: 93/59 (08/22 0403) Pulse Rate: 69 (08/22 0403)  Labs: Recent Labs    02/04/23 1101 02/05/23 0413 02/06/23 1006 02/06/23 1007 02/06/23 1612 02/07/23 0026  HGB 13.6  --  12.4*  --   --   --   HCT 41.0  --  38.3*  --   --   --   PLT 145*  --  144*  --   --   --   LABPROT  --  25.7*  --   --  26.2* 25.2*  INR  --  2.3*  --   --  2.4* 2.3*  CREATININE 16.55*  --   --  16.22*  --   --    ESRD  Assessment: 41yoM with hx of lupus anticoagulant/antiphospholipid syndrome and was supposed to be on apixaban prior to admission, but last picked up Dec 2023 for 90 day supply. Pharmacy initially consulted to restart apixaban on 01/27/23. Pharmacy later consulted to transition to full-dose enoxaparin, last dose of apixaban 01/28/23.   ESRD with usual MWF HD. Discussed with Drs. Lowell Guitar and Pearlean Brownie, who are aware of increased risk of bleeding with Enoxaparin and ESRD due to potential accumulation of active heparin metabolites that are undetected by anti-Xa levels. They would like to proceed with Enoxaparin for now.     Decision made to change long-term anticoagulation to warfarin on 8/13. Bridging with Lovenox. Chronic thrombocytopenia, stable. Not currently on any interacting medications. Hemoglobin stable up at 12 8/21. INR therapeutic 2.4 on warfarin 5mg  daily.   Pt refused labs twice 8/21 AM. Neph doesn't think that coumadin is a good option if pt is discharge without permanent facility like SNF since he a very poor hx of adherence. INR came back 2.3 today  Goal of Therapy:  INR 2-3 Monitor platelets  by anticoagulation protocol: Yes   Plan:  Warfarin 5 mg PO qday Check INR daily while on warfarin Continue to monitor H&H and platelets  Ulyses Southward, PharmD, BCIDP, AAHIVP, CPP Infectious Disease Pharmacist 02/07/2023 9:16 AM
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
## 2023-02-07 NOTE — Plan of Care (Signed)
Problem: Education: Goal: Ability to describe self-care measures that may prevent or decrease complications (Diabetes Survival Skills Education) will improve Outcome: Progressing Goal: Individualized Educational Video(s) Outcome: Progressing   Problem: Coping: Goal: Ability to adjust to condition or change in health will improve Outcome: Progressing   Problem: Fluid Volume: Goal: Ability to maintain a balanced intake and output will improve Outcome: Progressing   Problem: Health Behavior/Discharge Planning: Goal: Ability to identify and utilize available resources and services will improve Outcome: Progressing Goal: Ability to manage health-related needs will improve Outcome: Progressing   Problem: Metabolic: Goal: Ability to maintain appropriate glucose levels will improve Outcome: Progressing   Problem: Nutritional: Goal: Maintenance of adequate nutrition will improve Outcome: Progressing Goal: Progress toward achieving an optimal weight will improve Outcome: Progressing   Problem: Skin Integrity: Goal: Risk for impaired skin integrity will decrease Outcome: Progressing   Problem: Tissue Perfusion: Goal: Adequacy of tissue perfusion will improve Outcome: Progressing   Problem: Education: Goal: Knowledge of General Education information will improve Description: Including pain rating scale, medication(s)/side effects and non-pharmacologic comfort measures Outcome: Progressing   Problem: Health Behavior/Discharge Planning: Goal: Ability to manage health-related needs will improve Outcome: Progressing   Problem: Clinical Measurements: Goal: Ability to maintain clinical measurements within normal limits will improve Outcome: Progressing Goal: Will remain free from infection Outcome: Progressing Goal: Diagnostic test results will improve Outcome: Progressing Goal: Respiratory complications will improve Outcome: Progressing Goal: Cardiovascular complication will  be avoided Outcome: Progressing   Problem: Activity: Goal: Risk for activity intolerance will decrease Outcome: Progressing   Problem: Nutrition: Goal: Adequate nutrition will be maintained Outcome: Progressing   Problem: Coping: Goal: Level of anxiety will decrease Outcome: Progressing   Problem: Elimination: Goal: Will not experience complications related to bowel motility Outcome: Progressing Goal: Will not experience complications related to urinary retention Outcome: Progressing   Problem: Pain Managment: Goal: General experience of comfort will improve Outcome: Progressing   Problem: Safety: Goal: Ability to remain free from injury will improve Outcome: Progressing   Problem: Skin Integrity: Goal: Risk for impaired skin integrity will decrease Outcome: Progressing   Problem: Education: Goal: Knowledge of disease or condition will improve Outcome: Progressing Goal: Knowledge of secondary prevention will improve (MUST DOCUMENT ALL) Outcome: Progressing Goal: Knowledge of patient specific risk factors will improve Loraine Leriche N/A or DELETE if not current risk factor) Outcome: Progressing   Problem: Ischemic Stroke/TIA Tissue Perfusion: Goal: Complications of ischemic stroke/TIA will be minimized Outcome: Progressing   Problem: Coping: Goal: Will verbalize positive feelings about self Outcome: Progressing Goal: Will identify appropriate support needs Outcome: Progressing   Problem: Health Behavior/Discharge Planning: Goal: Ability to manage health-related needs will improve Outcome: Progressing Goal: Goals will be collaboratively established with patient/family Outcome: Progressing   Problem: Self-Care: Goal: Ability to participate in self-care as condition permits will improve Outcome: Progressing Goal: Verbalization of feelings and concerns over difficulty with self-care will improve Outcome: Progressing Goal: Ability to communicate needs accurately will  improve Outcome: Progressing   Problem: Nutrition: Goal: Risk of aspiration will decrease Outcome: Progressing Goal: Dietary intake will improve Outcome: Progressing   Problem: Education: Goal: Knowledge of disease or condition will improve Outcome: Progressing Goal: Knowledge of secondary prevention will improve (MUST DOCUMENT ALL) Outcome: Progressing Goal: Knowledge of patient specific risk factors will improve Loraine Leriche N/A or DELETE if not current risk factor) Outcome: Progressing   Problem: Ischemic Stroke/TIA Tissue Perfusion: Goal: Complications of ischemic stroke/TIA will be minimized Outcome: Progressing   Problem: Coping: Goal: Will  verbalize positive feelings about self Outcome: Progressing Goal: Will identify appropriate support needs Outcome: Progressing   Problem: Health Behavior/Discharge Planning: Goal: Ability to manage health-related needs will improve Outcome: Progressing Goal: Goals will be collaboratively established with patient/family Outcome: Progressing   Problem: Self-Care: Goal: Ability to participate in self-care as condition permits will improve Outcome: Progressing Goal: Verbalization of feelings and concerns over difficulty with self-care will improve Outcome: Progressing Goal: Ability to communicate needs accurately will improve Outcome: Progressing   Problem: Nutrition: Goal: Risk of aspiration will decrease Outcome: Progressing Goal: Dietary intake will improve Outcome: Progressing
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
## 2023-02-07 NOTE — Plan of Care (Signed)
  Problem: Nutritional: Goal: Maintenance of adequate nutrition will improve Outcome: Progressing   Problem: Skin Integrity: Goal: Risk for impaired skin integrity will decrease Outcome: Progressing   Problem: Clinical Measurements: Goal: Ability to maintain clinical measurements within normal limits will improve Outcome: Progressing Goal: Will remain free from infection Outcome: Progressing   Problem: Activity: Goal: Risk for activity intolerance will decrease Outcome: Progressing   Problem: Safety: Goal: Ability to remain free from injury will improve Outcome: Progressing   Problem: Skin Integrity: Goal: Risk for impaired skin integrity will decrease Outcome: Progressing

## 2023-02-07 NOTE — Plan of Care (Signed)
Problem: Education: Goal: Ability to describe self-care measures that may prevent or decrease complications (Diabetes Survival Skills Education) will improve 02/07/2023 0559 by Franne Grip, RN Outcome: Progressing 02/07/2023 0556 by Franne Grip, RN Outcome: Progressing Goal: Individualized Educational Video(s) 02/07/2023 0559 by Franne Grip, RN Outcome: Progressing 02/07/2023 0556 by Franne Grip, RN Outcome: Progressing   Problem: Coping: Goal: Ability to adjust to condition or change in health will improve 02/07/2023 0559 by Franne Grip, RN Outcome: Progressing 02/07/2023 0556 by Franne Grip, RN Outcome: Progressing   Problem: Fluid Volume: Goal: Ability to maintain a balanced intake and output will improve 02/07/2023 0559 by Franne Grip, RN Outcome: Progressing 02/07/2023 0556 by Franne Grip, RN Outcome: Progressing   Problem: Health Behavior/Discharge Planning: Goal: Ability to identify and utilize available resources and services will improve 02/07/2023 0559 by Franne Grip, RN Outcome: Progressing 02/07/2023 0556 by Franne Grip, RN Outcome: Progressing Goal: Ability to manage health-related needs will improve 02/07/2023 0559 by Franne Grip, RN Outcome: Progressing 02/07/2023 0556 by Franne Grip, RN Outcome: Progressing   Problem: Metabolic: Goal: Ability to maintain appropriate glucose levels will improve 02/07/2023 0559 by Franne Grip, RN Outcome: Progressing 02/07/2023 0556 by Franne Grip, RN Outcome: Progressing   Problem: Nutritional: Goal: Maintenance of adequate nutrition will improve 02/07/2023 0559 by Franne Grip, RN Outcome: Progressing 02/07/2023 0556 by Franne Grip, RN Outcome: Progressing Goal: Progress toward achieving an optimal weight will improve 02/07/2023 0559 by Franne Grip, RN Outcome: Progressing 02/07/2023 0556 by Franne Grip, RN Outcome: Progressing   Problem: Skin Integrity: Goal: Risk for impaired skin  integrity will decrease 02/07/2023 0559 by Franne Grip, RN Outcome: Progressing 02/07/2023 0556 by Franne Grip, RN Outcome: Progressing   Problem: Tissue Perfusion: Goal: Adequacy of tissue perfusion will improve 02/07/2023 0559 by Franne Grip, RN Outcome: Progressing 02/07/2023 0556 by Franne Grip, RN Outcome: Progressing   Problem: Education: Goal: Knowledge of General Education information will improve Description: Including pain rating scale, medication(s)/side effects and non-pharmacologic comfort measures 02/07/2023 0559 by Franne Grip, RN Outcome: Progressing 02/07/2023 0556 by Franne Grip, RN Outcome: Progressing   Problem: Health Behavior/Discharge Planning: Goal: Ability to manage health-related needs will improve 02/07/2023 0559 by Franne Grip, RN Outcome: Progressing 02/07/2023 0556 by Franne Grip, RN Outcome: Progressing   Problem: Clinical Measurements: Goal: Ability to maintain clinical measurements within normal limits will improve 02/07/2023 0559 by Franne Grip, RN Outcome: Progressing 02/07/2023 0556 by Franne Grip, RN Outcome: Progressing Goal: Will remain free from infection 02/07/2023 0559 by Franne Grip, RN Outcome: Progressing 02/07/2023 0556 by Franne Grip, RN Outcome: Progressing Goal: Diagnostic test results will improve 02/07/2023 0559 by Franne Grip, RN Outcome: Progressing 02/07/2023 0556 by Franne Grip, RN Outcome: Progressing Goal: Respiratory complications will improve 02/07/2023 0559 by Franne Grip, RN Outcome: Progressing 02/07/2023 0556 by Franne Grip, RN Outcome: Progressing Goal: Cardiovascular complication will be avoided 02/07/2023 0559 by Franne Grip, RN Outcome: Progressing 02/07/2023 0556 by Franne Grip, RN Outcome: Progressing   Problem: Activity: Goal: Risk for activity intolerance will decrease 02/07/2023 0559 by Franne Grip, RN Outcome: Progressing 02/07/2023 0556 by Franne Grip, RN Outcome:  Progressing   Problem: Nutrition: Goal: Adequate nutrition will be maintained 02/07/2023 0559 by Franne Grip, RN Outcome: Progressing 02/07/2023 0556 by Franne Grip, RN Outcome: Progressing   Problem: Coping: Goal: Level of anxiety will decrease 02/07/2023 0559 by Franne Grip, RN Outcome: Progressing 02/07/2023 0556 by Franne Grip, RN Outcome: Progressing   Problem: Elimination: Goal: Will not experience complications related to bowel motility 02/07/2023 0559  by Franne Grip, RN Outcome: Progressing 02/07/2023 0556 by Franne Grip, RN Outcome: Progressing Goal: Will not experience complications related to urinary retention 02/07/2023 0559 by Franne Grip, RN Outcome: Progressing 02/07/2023 0556 by Franne Grip, RN Outcome: Progressing   Problem: Pain Managment: Goal: General experience of comfort will improve 02/07/2023 0559 by Franne Grip, RN Outcome: Progressing 02/07/2023 0556 by Franne Grip, RN Outcome: Progressing   Problem: Safety: Goal: Ability to remain free from injury will improve 02/07/2023 0559 by Franne Grip, RN Outcome: Progressing 02/07/2023 0556 by Franne Grip, RN Outcome: Progressing   Problem: Skin Integrity: Goal: Risk for impaired skin integrity will decrease 02/07/2023 0559 by Franne Grip, RN Outcome: Progressing 02/07/2023 0556 by Franne Grip, RN Outcome: Progressing   Problem: Education: Goal: Knowledge of disease or condition will improve 02/07/2023 0559 by Franne Grip, RN Outcome: Progressing 02/07/2023 0556 by Franne Grip, RN Outcome: Progressing Goal: Knowledge of secondary prevention will improve (MUST DOCUMENT ALL) 02/07/2023 0559 by Franne Grip, RN Outcome: Progressing 02/07/2023 0556 by Franne Grip, RN Outcome: Progressing Goal: Knowledge of patient specific risk factors will improve Loraine Leriche N/A or DELETE if not current risk factor) 02/07/2023 0559 by Franne Grip, RN Outcome: Progressing 02/07/2023 0556 by  Franne Grip, RN Outcome: Progressing   Problem: Ischemic Stroke/TIA Tissue Perfusion: Goal: Complications of ischemic stroke/TIA will be minimized 02/07/2023 0559 by Franne Grip, RN Outcome: Progressing 02/07/2023 0556 by Franne Grip, RN Outcome: Progressing   Problem: Coping: Goal: Will verbalize positive feelings about self 02/07/2023 0559 by Franne Grip, RN Outcome: Progressing 02/07/2023 0556 by Franne Grip, RN Outcome: Progressing Goal: Will identify appropriate support needs 02/07/2023 0559 by Franne Grip, RN Outcome: Progressing 02/07/2023 0556 by Franne Grip, RN Outcome: Progressing   Problem: Health Behavior/Discharge Planning: Goal: Ability to manage health-related needs will improve 02/07/2023 0559 by Franne Grip, RN Outcome: Progressing 02/07/2023 0556 by Franne Grip, RN Outcome: Progressing Goal: Goals will be collaboratively established with patient/family 02/07/2023 0559 by Franne Grip, RN Outcome: Progressing 02/07/2023 0556 by Franne Grip, RN Outcome: Progressing   Problem: Self-Care: Goal: Ability to participate in self-care as condition permits will improve 02/07/2023 0559 by Franne Grip, RN Outcome: Progressing 02/07/2023 0556 by Franne Grip, RN Outcome: Progressing Goal: Verbalization of feelings and concerns over difficulty with self-care will improve 02/07/2023 0559 by Franne Grip, RN Outcome: Progressing 02/07/2023 0556 by Franne Grip, RN Outcome: Progressing Goal: Ability to communicate needs accurately will improve 02/07/2023 0559 by Franne Grip, RN Outcome: Progressing 02/07/2023 0556 by Franne Grip, RN Outcome: Progressing   Problem: Nutrition: Goal: Risk of aspiration will decrease 02/07/2023 0559 by Franne Grip, RN Outcome: Progressing 02/07/2023 0556 by Franne Grip, RN Outcome: Progressing Goal: Dietary intake will improve 02/07/2023 0559 by Franne Grip, RN Outcome: Progressing 02/07/2023 0556 by Franne Grip, RN Outcome: Progressing   Problem: Education: Goal: Knowledge of disease or condition will improve 02/07/2023 0559 by Franne Grip, RN Outcome: Progressing 02/07/2023 0556 by Franne Grip, RN Outcome: Progressing Goal: Knowledge of secondary prevention will improve (MUST DOCUMENT ALL) 02/07/2023 0559 by Franne Grip, RN Outcome: Progressing 02/07/2023 0556 by Franne Grip, RN Outcome: Progressing Goal: Knowledge of patient specific risk factors will improve Loraine Leriche N/A or DELETE if not current risk factor) 02/07/2023 0559 by Franne Grip, RN Outcome: Progressing 02/07/2023 0556 by Franne Grip, RN Outcome: Progressing   Problem: Ischemic Stroke/TIA Tissue Perfusion: Goal: Complications of ischemic stroke/TIA will be minimized 02/07/2023 0559 by Franne Grip, RN Outcome: Progressing 02/07/2023 0556 by Franne Grip, RN Outcome: Progressing  Problem: Coping: Goal: Will verbalize positive feelings about self 02/07/2023 0559 by Franne Grip, RN Outcome: Progressing 02/07/2023 0556 by Franne Grip, RN Outcome: Progressing Goal: Will identify appropriate support needs 02/07/2023 0559 by Franne Grip, RN Outcome: Progressing 02/07/2023 0556 by Franne Grip, RN Outcome: Progressing   Problem: Health Behavior/Discharge Planning: Goal: Ability to manage health-related needs will improve 02/07/2023 0559 by Franne Grip, RN Outcome: Progressing 02/07/2023 0556 by Franne Grip, RN Outcome: Progressing Goal: Goals will be collaboratively established with patient/family 02/07/2023 0559 by Franne Grip, RN Outcome: Progressing 02/07/2023 0556 by Franne Grip, RN Outcome: Progressing   Problem: Self-Care: Goal: Ability to participate in self-care as condition permits will improve Outcome: Progressing Goal: Verbalization of feelings and concerns over difficulty with self-care will improve Outcome: Progressing Goal: Ability to communicate needs accurately will  improve Outcome: Progressing   Problem: Nutrition: Goal: Risk of aspiration will decrease Outcome: Progressing Goal: Dietary intake will improve Outcome: Progressing

## 2023-02-07 NOTE — Progress Notes (Signed)
  [facility] Progress Note   Subjective:   Seen in room - no new complaints. Dialyzed yesterday - did fine, no issues, 1.5L removed.  Objective Vitals:   02/06/23 1743 02/06/23 1936 02/07/23 0403 02/07/23 0500  BP: 102/65 90/69 (!) 93/59   Pulse:  84 69   Resp:      Temp:  98.3 F (36.8 C) (!) 97.5 F (36.4 C)   TempSrc:  Oral Oral   SpO2:  100%    Weight:    101.9 kg  Height:       Physical Exam General: Well appearing man, NAD. Room air. Heart: RRR; no murmur Lungs: CTAB, no rales Abdomen: soft Extremities: no LE edema Dialysis Access:  [facility]  Additional Objective Labs: Basic Metabolic Panel: Recent Labs  Lab 02/01/23 1010 02/04/23 1101 02/06/23 1007  NA 137 133* 133*  K 4.0 4.5 4.8  CL 94* 92* 94*  CO2 24 22 22   GLUCOSE 115* 96 84  BUN 65* 70* 71*  CREATININE 14.64* 16.55* 16.22*  CALCIUM 8.7* 8.9 8.6*  PHOS 9.5* 8.2* 7.9*   Liver Function Tests: Recent Labs  Lab 02/01/23 1010 02/04/23 1101 02/06/23 1007  ALBUMIN 3.9 3.8 3.5   CBC: Recent Labs  Lab 02/01/23 0930 02/04/23 1101 02/06/23 1006  WBC 6.3 6.7 6.2  HGB 14.0 13.6 12.4*  HCT 41.9 41.0 38.3*  MCV 95.0 94.3 94.1  PLT 111* 145* 144*   Medications:  sodium chloride 10 mL/hr at 01/23/23 1400    atorvastatin  40 mg Oral Daily   Chlorhexidine Gluconate Cloth  6 each Topical Q0600   cinacalcet  60 mg Oral Q supper   levETIRAcetam  500 mg Oral BID   midodrine  5 mg Oral TID WC   multivitamin  1 tablet Oral QHS   sevelamer carbonate  2,400 mg Oral TID WC   warfarin  5 mg Oral q1600   Warfarin - Pharmacist Dosing Inpatient   Does not apply q1600    Dialysis Orders: [facility] MWF 4h   400/800 112.8kg  2/2 bath  TDC  Heparin none - Mircera 120 mcg IV q 2 weeks (last dose 01/16/2023) - Venofer 50 mg IV q week   Assessment/Plan: Seizures: Had not been taking OP meds. Found down at [employer]. Improved now. Acute CVA per MRI done 8/10 w/ cerebellar CVA w/o hemorrhage.  Neuro switched Eliquis to warfarin. FYI - dialysis clinic will NOT manage his INR, needs PCP to do this. Acute hypoxic respiratory failure: Pulm edema + possible aspiration PNA on admit, better now. BP/volume: BP low sided (on mido 5mg  TID), now well below prior EDW - will lower on d/c (~101kg). Amlod on hold. ESRD: Continue HD on usual MWF schedule - next HD tomorrow (8/23). Anemia: Hgb down to 7's earlier this admit - now > 12, ESA on hold. Secondary HPTH: Ca ok, Phos high but improving. Continue sevelamer, sensipar 60mg  every day.  Nutrition: Alb slightly low, continue renal/high protein diet, adding supps.  Ozzie Hoyle, PA-C 02/07/2023, 9:51 AM  [employer]

## 2023-02-08 DIAGNOSIS — R569 Unspecified convulsions: Secondary | ICD-10-CM | POA: Diagnosis not present

## 2023-02-08 LAB — RENAL FUNCTION PANEL
Albumin: 3.8 g/dL (ref 3.5–5.0)
Anion gap: 18 — ABNORMAL HIGH (ref 5–15)
BUN: 80 mg/dL — ABNORMAL HIGH (ref 6–20)
CO2: 23 mmol/L (ref 22–32)
Calcium: 8.5 mg/dL — ABNORMAL LOW (ref 8.9–10.3)
Chloride: 92 mmol/L — ABNORMAL LOW (ref 98–111)
Creatinine, Ser: 16.12 mg/dL — ABNORMAL HIGH (ref 0.61–1.24)
GFR, Estimated: 3 mL/min — ABNORMAL LOW (ref >60)
Glucose, Bld: 81 mg/dL (ref 70–99)
Phosphorus: 8.5 mg/dL — ABNORMAL HIGH (ref 2.5–4.6)
Potassium: 4.6 mmol/L (ref 3.5–5.1)
Sodium: 133 mmol/L — ABNORMAL LOW (ref 135–145)

## 2023-02-08 LAB — CBC
HCT: 40.3 % (ref 39.0–52.0)
Hemoglobin: 13 g/dL (ref 13.0–17.0)
MCH: 30.3 pg (ref 26.0–34.0)
MCHC: 32.3 g/dL (ref 30.0–36.0)
MCV: 93.9 fL (ref 80.0–100.0)
Platelets: 163 10*3/uL (ref 150–400)
RBC: 4.29 MIL/uL (ref 4.22–5.81)
RDW: 14.6 % (ref 11.5–15.5)
WBC: 5.9 10*3/uL (ref 4.0–10.5)
nRBC: 0 % (ref 0.0–0.2)

## 2023-02-08 LAB — PROTIME-INR
INR: 2.3 — ABNORMAL HIGH (ref 0.8–1.2)
Prothrombin Time: 25.6 s — ABNORMAL HIGH (ref 11.4–15.2)

## 2023-02-08 MED ORDER — LIDOCAINE-PRILOCAINE 2.5-2.5 % EX CREA: 1.0000 | TOPICAL_CREAM | CUTANEOUS | Status: DC | PRN

## 2023-02-08 MED ORDER — HEPARIN SODIUM (PORCINE) 1000 UNIT/ML DIALYSIS
1000.0000 [IU] | INTRAMUSCULAR | Status: DC | PRN
  Administered 2023-02-08: 3200 [IU]
  Filled 2023-02-08: qty 1

## 2023-02-08 MED ORDER — PENTAFLUOROPROP-TETRAFLUOROETH EX AERO: 1.0000 | INHALATION_SPRAY | CUTANEOUS | Status: DC | PRN

## 2023-02-08 MED ORDER — ALTEPLASE 2 MG IJ SOLR: 2.0000 mg | Freq: Once | INTRAMUSCULAR | Status: DC | PRN

## 2023-02-08 MED ORDER — LIDOCAINE HCL (PF) 1 % IJ SOLN: 5.0000 mL | INTRAMUSCULAR | Status: DC | PRN

## 2023-02-08 MED ORDER — ANTICOAGULANT SODIUM CITRATE 4% (200MG/5ML) IV SOLN: 5.0000 mL | Status: DC | PRN

## 2023-02-08 NOTE — Progress Notes (Signed)
Received patient in bed to unit.  Alert and oriented.  Informed consent signed and in chart.   TX duration:3.5  Patient tolerated well.  Transported back to the room  Alert, without acute distress.  Hand-off given to patient's nurse.   Access used: Right Cather Access issues: none  Total UF removed: Medication(s) given: midodrine, see MAR   02/08/23 1450  Vitals  Temp (!) 97.5 F (36.4 C)  Temp Source Oral  BP 99/81  MAP (mmHg) 87  BP Location Right Arm  BP Method Automatic  Patient Position (if appropriate) Sitting  Pulse Rate 91  Pulse Rate Source Monitor  ECG Heart Rate 73  Resp 16  MEWS COLOR  MEWS Score Color Green  Oxygen Therapy  SpO2 96 %  O2 Device Room Air  MEWS Score  MEWS Temp 0  MEWS Systolic 1  MEWS Pulse 0  MEWS RR 0  MEWS LOC 0  MEWS Score 1     Stacie Glaze LPN [facility]

## 2023-02-08 NOTE — TOC Progression Note (Addendum)
Transition of Care [facility]) - Progression Note    Patient Details  Name: [name] [name] MRN: [id number] Date of Birth: 1981-02-14  Transition [facility]) CM/SW Contact  Harlem Thresher A [place], [place] Phone Number: 02/08/2023, 4:17 PM  Clinical Narrative:     CSW received denial of insurance request for authorization. Provider and facility notified.  CSW notified pt and provided letter with contact information for appeal. CSW asked pt if any family or friends could assist with appeal. Pt stated he would reach out and ask for assistance.   TOC will continue to follow.     Expected Discharge Plan: OP Rehab Barriers to Discharge: No Barriers Identified  Expected Discharge Plan and Services   Discharge Planning Services: CM Consult Post Acute Care Choice: Durable Medical Equipment, Resumption of Svcs/PTA Provider Living arrangements for the past 2 months: Hotel/Motel                 DME Arranged: Walker rolling DME Agency: [employer] Date DME Agency Contacted: 01/25/23 Time DME Agency Contacted: 1539 Representative spoke with at DME Agency: Vaughan Basta, CM with [employer]             Social Determinants of Health (SDOH) Interventions SDOH Screenings   Food Insecurity: No Food Insecurity (02/03/2023)  Housing: Low Risk  (02/03/2023)  Transportation Needs: No Transportation Needs (02/03/2023)  Utilities: Not At Risk (02/03/2023)    Readmission Risk Interventions    01/25/2023    3:40 PM  Readmission Risk Prevention Plan  Post Dischage Appt Complete  Medication Screening Complete  Transportation Screening Complete

## 2023-02-08 NOTE — Progress Notes (Signed)
ANTICOAGULATION CONSULT NOTE  Pharmacy Consult for Warfarin  Indication:  antiphospholipid syndrome  Allergies  Allergen Reactions   Cetirizine & Related Swelling    Patient Measurements: Height: 6\' 1"  (185.4 cm) Weight: 101.9 kg (224 lb 10.4 oz) IBW/kg (Calculated) : 79.9 Enoxparin Dosing Weight: 102 kg  Vital Signs: Temp: 98 F (36.7 C) (08/23 0549) BP: 122/86 (08/23 0549) Pulse Rate: 56 (08/23 0549)  Labs: Recent Labs    02/06/23 1006 02/06/23 1007 02/06/23 1612 02/07/23 0026 02/08/23 0559  HGB 12.4*  --   --   --   --   HCT 38.3*  --   --   --   --   PLT 144*  --   --   --   --   LABPROT  --   --  26.2* 25.2* 25.6*  INR  --   --  2.4* 2.3* 2.3*  CREATININE  --  16.22*  --   --   --    ESRD  Assessment: 41yoM with hx of lupus anticoagulant/antiphospholipid syndrome and was supposed to be on apixaban prior to admission, but last picked up Dec 2023 for 90 day supply. Pharmacy initially consulted to restart apixaban on 01/27/23. Pharmacy later consulted to transition to full-dose enoxaparin, last dose of apixaban 01/28/23.   ESRD with usual MWF HD. Discussed with Drs. Lowell Guitar and Pearlean Brownie, who are aware of increased risk of bleeding with Enoxaparin and ESRD due to potential accumulation of active heparin metabolites that are undetected by anti-Xa levels. They would like to proceed with Enoxaparin for now.     Decision made to change long-term anticoagulation to warfarin on 8/13. Bridging with Lovenox. Chronic thrombocytopenia, stable. Not currently on any interacting medications. Hemoglobin stable up at 12 8/21. INR therapeutic 2.4 on warfarin 5mg  daily.   Pt refused labs twice 8/21. Neph doesn't think that coumadin is a good option if pt is discharge without permanent facility like SNF since he a very poor hx of adherence. INR has been stable for several days. He is on a standing dose now. We will space out his INR  Goal of Therapy:  INR 2-3 Monitor platelets by  anticoagulation protocol: Yes   Plan:  Warfarin 5 mg PO qday Check INR MWF Continue to monitor H&H and platelets  Ulyses Southward, PharmD, BCIDP, AAHIVP, CPP Infectious Disease Pharmacist 02/08/2023 9:36 AM

## 2023-02-08 NOTE — Progress Notes (Signed)
Subjective:  seen end of hd tolerated "3l  uf "on hd , no cos   Objective Vital signs in last 24 hours: Vitals:   02/08/23 1130 02/08/23 1200 02/08/23 1230 02/08/23 1300  BP: (!) 126/94 (!) 114/92 124/81 116/88  Pulse: (!) 53 (!) 49 (!) 57 (!) 58  Resp: 12 (!) 9 20 10   Temp:      TempSrc:      SpO2: 96% 100% 98% 95%  Weight:      Height:       Weight change:   Physical Exam: General: alert nad, RM air  Heart: RRR, No mrg Lungs: CTAb  non labored  rm air Abdomen: NABS , soft, Nt, nd Extremities: no pedal edema Dialysis Access: R internal jugular TDC    OP Dialysis Orders: [facility] MWF 4h   400/800 112.8kg  2/2 bath  TDC  Heparin none - Mircera 120 mcg IV q 2 weeks (last dose 01/16/2023) - Venofer 50 mg IV q week   Assessment/Plan: Seizures: Had not been taking OP meds. Found down at [employer]. Improved now. Acute CVA per MRI done 8/10 w/ cerebellar CVA w/o hemorrhage. Neuro switched Eliquis to warfarin. FYI - dialysis clinic will NOT manage his INR, needs PCP to do this. Acute hypoxic respiratory failure: Pulm edema + possible aspiration PNA on admit, better now. BP/volume: BP low sided (on mido 5mg  TID), now well below prior EDW - will lower on d/c (~101kg). Amlod on hold. ESRD: Continue HD on usual MWF schedule on schedule. Anemia: Hgb down to 7's earlier this admit - now > 13.0  ESA on hold. Secondary HPTH: Ca ok, Phos  8.5  Continue sevelamer, sensipar 60mg  every day.  Nutrition: Alb 3.8 , continue renal/high protein diet, and on  supps.   Lenny Pastel, PA-C [facility] Beeper [phone] 02/08/2023,1:22 PM  LOS: 20 days   Labs: Basic Metabolic Panel: Recent Labs  Lab 02/04/23 1101 02/06/23 1007 02/08/23 0559  NA 133* 133* 133*  K 4.5 4.8 4.6  CL 92* 94* 92*  CO2 22 22 23   GLUCOSE 96 84 81  BUN 70* 71* 80*  CREATININE 16.55* 16.22* 16.12*  CALCIUM 8.9 8.6* 8.5*  PHOS 8.2* 7.9* 8.5*   Liver Function Tests: Recent Labs  Lab  02/04/23 1101 02/06/23 1007 02/08/23 0559  ALBUMIN 3.8 3.5 3.8   No results for input(s): "LIPASE", "AMYLASE" in the last 168 hours. No results for input(s): "AMMONIA" in the last 168 hours. CBC: Recent Labs  Lab 02/04/23 1101 02/06/23 1006 02/08/23 0954  WBC 6.7 6.2 5.9  HGB 13.6 12.4* 13.0  HCT 41.0 38.3* 40.3  MCV 94.3 94.1 93.9  PLT 145* 144* 163   Cardiac Enzymes: No results for input(s): "CKTOTAL", "CKMB", "CKMBINDEX", "TROPONINI" in the last 168 hours. CBG: Recent Labs  Lab 02/01/23 2130  GLUCAP 354*    Studies/Results: No results found. Medications:  sodium chloride 10 mL/hr at 01/23/23 1400   anticoagulant sodium citrate      atorvastatin  40 mg Oral Daily   Chlorhexidine Gluconate Cloth  6 each Topical Q0600   cinacalcet  60 mg Oral Q supper   levETIRAcetam  500 mg Oral BID   midodrine  5 mg Oral TID WC   multivitamin  1 tablet Oral QHS   sevelamer carbonate  2,400 mg Oral TID WC   warfarin  5 mg Oral q1600   Warfarin - Pharmacist Dosing Inpatient   Does not apply q1600

## 2023-02-08 NOTE — Hospital Course (Signed)
69M w/ hx seizure d/o, HTN, ESRD on HD MWF, SLE lupus nephritis, HLD was brought to the ED on 01/18/2023 after found down at [employer] following a seizure and subsequent altered mental status, was admitted in ICU on 01/18/23 with resultant hypoxic respiratory failure, was intubated 8/2:admitted after seizure with resultant hypoxic respiratory failure -intubation 8/3 HD initiated for volume overload -stopped due to hypotension 8/3 EEG noted diffuse slowing but no seizure or epileptogenicity 8/5 extubated -found to have right arm weakness/unable to lift right arm above shoulder 8/6 TRH assumed care - transferred to Med/Surg bed  8/10 MRI with acute stroke  8/13 pending SNF placement.

## 2023-02-08 NOTE — Progress Notes (Signed)
PROGRESS NOTE [name] [name]  WUJ:[id number] DOB: 10-Jun-1981 DOA: 01/18/2023 PCP: No primary care provider on file.  Brief Narrative/Hospital Course: 35M w/ hx seizure d/o, HTN, ESRD on HD MWF, SLE lupus nephritis, HLD was brought to the ED on 01/18/2023 after found down at fast food restaurant following a seizure and subsequent altered mental status, was admitted in ICU on 01/18/23 with resultant hypoxic respiratory failure, was intubated 8/2:admitted after seizure with resultant hypoxic respiratory failure -intubation 8/3 HD initiated for volume overload -stopped due to hypotension 8/3 EEG noted diffuse slowing but no seizure or epileptogenicity 8/5 extubated -found to have right arm weakness/unable to lift right arm above shoulder 8/6 TRH assumed care - transferred to Med/Surg bed  8/10 MRI with acute stroke  8/13 pending SNF placement.    Subjective: Patient seen and examined this morning he is alert awake He has no complaints Overnight afebrile BP stable, on room air Labs reviewed this morning BUN 18 with creatinine 16.2, anion gap 18 from 19>17.  Assessment and Plan: Principal Problem:   Seizure (HCC) Active Problems:   Acute respiratory failure with hypoxia (HCC)   Seizure disorder: In the setting of poor compliance with AEDs presenting with the ED after seizure.  9 stable continue Keppra, seizure precaution> continue driving restriction uintil seizure-free for 6 months per [place] DMV   Acute cerebellar stroke SLE antiphospholipid syndrome: Completed stroke workup with MRI brain and MRA head carotid ultrasound echo with EF 55 to 60% severe LVH, LDL 105 HbA1c 5.2 etiology likely hypercoagulable disorder from SLE/antiphospholipid antibody syndrome versus cardio embolic.  Neurology has seen patient.  Recommended switching Eliquis to Lovenox although per pharmacy not a great recommendation given his ESRD status, subsequently switched to Coumadin INR at goal 2-3.  Pharmacy monitoring.  Needs  to go back to see rheumatology not seen since 2022. Coumadin may not be ideal option if he gets discharged without permanent facility like SNF  since has poor adherence. Recent Labs  Lab 02/03/23 0539 02/05/23 0413 02/06/23 1612 02/07/23 0026 02/08/23 0559  INR 2.0* 2.3* 2.4* 2.3* 2.3*    Compressive cervical myelopathy: MR C-spine with C5-6 herniated disc, spinal stenosis and cervical myopathy. Seen by neurosurgery, Dr. Lovell Sheehan on 01/23/2023 with recommendation of a C5-6 anterior cervicectomy fusion and plating; plans for outpatient follow-up in 2 weeks.   Shock secondary to sepsis versus radiation-induced > on vasopressor transiently during intubation.  Resolved  Acute hypoxic respiratory failure  Aspiration pneumonitis : Aspiration following seizure completed antibiotics  01/23/23, doing well on room air afebrile   ESRD on HD MWF: Nephrology following closely Anion gap metabolic acidosis in the setting of ESRD monitor. Anemia of chronic kidney disease: Continue to monitor hemoglobin ESA on hold Secondary hyperparathyroidism: Calcium okay Phos high but improving continue stable limits Sensipar  Hypertension Autonomic dysfunction: Coreg discontinued and started on midodrine continue to monitor  HLD: Continue statins  Metabolic encephalopathy: Likely in the setting of seizure and hypoxia continue delirium precaution,?  Cognitive impairment  Chronic thrombocytopenia: Monitor  Weakness/deconditioning/debility: Continue PT OT awaiting additional  DVT prophylaxis: SCDs Start: 01/19/23 0022 on coumadin Code Status:   Code Status: Full Code Family Communication: plan of care discussed with patient at bedside. Patient status is: Inpatient because of awaiting placement unsafe disposition Level of care: Med-Surg   Dispo: The patient is from: has room partner            Anticipated disposition: SNF pending.  Medically stable for discharge Objective: Vitals last 24  hrs: Vitals:    02/07/23 0500 02/07/23 1518 02/07/23 2035 02/08/23 0549  BP:  103/77 122/71 122/86  Pulse:  65 73 (!) 56  Resp:  18 18 18   Temp:   97.8 F (36.6 C) 98 F (36.7 C)  TempSrc:   Oral   SpO2:  99% 98% 100%  Weight: 101.9 kg     Height:       Weight change:   Physical Examination: General exam: alert awake, older than stated age HEENT:Oral mucosa moist, Ear/Nose WNL grossly Respiratory system: bilaterally clear BS, no use of accessory muscle Cardiovascular system: S1 & S2 +, No JVD. Gastrointestinal system: Abdomen soft,NT,ND, BS+ Nervous System:Alert, awake, moving extremities. Extremities: LE edema neg,distal peripheral pulses palpable.  Skin: No rashes,no icterus. MSK: Normal muscle bulk,tone, power  Medications reviewed:  Scheduled Meds:  atorvastatin  40 mg Oral Daily   Chlorhexidine Gluconate Cloth  6 each Topical Q0600   cinacalcet  60 mg Oral Q supper   levETIRAcetam  500 mg Oral BID   midodrine  5 mg Oral TID WC   multivitamin  1 tablet Oral QHS   sevelamer carbonate  2,400 mg Oral TID WC   warfarin  5 mg Oral q1600   Warfarin - Pharmacist Dosing Inpatient   Does not apply q1600   Continuous Infusions:  sodium chloride 10 mL/hr at 01/23/23 1400   anticoagulant sodium citrate      Diet Order             Diet 2 gram sodium Room service appropriate? Yes with Assist; Fluid consistency: Thin; Fluid restriction: 1200 mL Fluid  Diet effective now                 Nutrition Problem: Inadequate oral intake Etiology: inability to eat Signs/Symptoms: NPO status Interventions: Refer to RD note for recommendations  No intake or output data in the 24 hours ending 02/08/23 1041 Net IO Since Admission: -15,942.37 mL [02/08/23 1041]  Wt Readings from Last 3 Encounters:  02/07/23 101.9 kg   Unresulted Labs (From admission, onward)     Start     Ordered   02/11/23 0500  Protime-INR  Every Mon-Wed-Fri (0500),   R     Question:  Specimen collection method  Answer:   Lab=Lab collect   02/08/23 0936          Data Reviewed: I have personally reviewed following labs and imaging studies Recent Labs  Lab 02/04/23 1101 02/06/23 1006 02/08/23 0954  WBC 6.7 6.2 5.9  HGB 13.6 12.4* 13.0  HCT 41.0 38.3* 40.3  MCV 94.3 94.1 93.9  PLT 145* 144* 163  Basic Metabolic Panel: Recent Labs  Lab 02/04/23 1101 02/06/23 1007 02/08/23 0559  NA 133* 133* 133*  K 4.5 4.8 4.6  CL 92* 94* 92*  CO2 22 22 23   GLUCOSE 96 84 81  BUN 70* 71* 80*  CREATININE 16.55* 16.22* 16.12*  CALCIUM 8.9 8.6* 8.5*  PHOS 8.2* 7.9* 8.5*   Recent Labs  Lab 02/01/23 2130  GLUCAP 354*   No results found for this or any previous visit (from the past 240 hour(s)).  Antimicrobials: Anti-infectives (From admission, onward)    Start     Dose/Rate Route Frequency Ordered Stop   01/20/23 1745  ampicillin-sulbactam (UNASYN) 1.5 g in sodium chloride 0.9 % 100 mL IVPB        1.5 g 200 mL/hr over 30 Minutes Intravenous Every 12 hours 01/20/23 1019 01/23/23 1814   01/19/23  0530  ampicillin-sulbactam (UNASYN) 1.5 g in sodium chloride 0.9 % 100 mL IVPB  Status:  Discontinued        1.5 g 200 mL/hr over 30 Minutes Intravenous Every 12 hours 01/19/23 0445 01/20/23 1019   01/18/23 2230  vancomycin (VANCOCIN) IVPB 1000 mg/200 mL premix  Status:  Discontinued        1,000 mg 200 mL/hr over 60 Minutes Intravenous  Once 01/18/23 2218 01/18/23 2222   01/18/23 2230  metroNIDAZOLE (FLAGYL) IVPB 500 mg        500 mg 100 mL/hr over 60 Minutes Intravenous  Once 01/18/23 2218 01/19/23 0047   01/18/23 2230  ceFEPIme (MAXIPIME) 2 g in sodium chloride 0.9 % 100 mL IVPB        2 g 200 mL/hr over 30 Minutes Intravenous  Once 01/18/23 2222 01/18/23 2323   01/18/23 2230  vancomycin (VANCOREADY) IVPB 2000 mg/400 mL        2,000 mg 200 mL/hr over 120 Minutes Intravenous  Once 01/18/23 2222 01/19/23 0415      Culture/Microbiology    Component Value Date/Time   SDES BLOOD RIGHT HAND 01/19/2023 0213    SPECREQUEST  01/19/2023 4098    BOTTLES DRAWN AEROBIC AND ANAEROBIC Blood Culture results may not be optimal due to an excessive volume of blood received in culture bottles   CULT  01/19/2023 0213    NO GROWTH 5 DAYS Performed at [facility], [address], [place], [place] [address]    REPTSTATUS 01/24/2023 FINAL 01/19/2023 7829    Radiology Studies: No results found.   LOS: 20 days   Lanae Boast, MD [facility]  02/08/2023, 10:41 AM

## 2023-02-08 NOTE — Plan of Care (Signed)
 Problem: Education: Goal: Ability to describe self-care measures that may prevent or decrease complications (Diabetes Survival Skills Education) will improve Outcome: Progressing Goal: Individualized Educational Video(s) Outcome: Progressing   Problem: Coping: Goal: Ability to adjust to condition or change in health will improve Outcome: Progressing   Problem: Fluid Volume: Goal: Ability to maintain a balanced intake and output will improve Outcome: Progressing   Problem: Health Behavior/Discharge Planning: Goal: Ability to identify and utilize available resources and services will improve Outcome: Progressing Goal: Ability to manage health-related needs will improve Outcome: Progressing   Problem: Metabolic: Goal: Ability to maintain appropriate glucose levels will improve Outcome: Progressing   Problem: Nutritional: Goal: Maintenance of adequate nutrition will improve Outcome: Progressing Goal: Progress toward achieving an optimal weight will improve Outcome: Progressing   Problem: Skin Integrity: Goal: Risk for impaired skin integrity will decrease Outcome: Progressing   Problem: Tissue Perfusion: Goal: Adequacy of tissue perfusion will improve Outcome: Progressing   Problem: Education: Goal: Knowledge of General Education information will improve Description: Including pain rating scale, medication(s)/side effects and non-pharmacologic comfort measures Outcome: Progressing   Problem: Health Behavior/Discharge Planning: Goal: Ability to manage health-related needs will improve Outcome: Progressing   Problem: Clinical Measurements: Goal: Ability to maintain clinical measurements within normal limits will improve Outcome: Progressing Goal: Will remain free from infection Outcome: Progressing Goal: Diagnostic test results will improve Outcome: Progressing Goal: Respiratory complications will improve Outcome: Progressing Goal: Cardiovascular complication will  be avoided Outcome: Progressing   Problem: Activity: Goal: Risk for activity intolerance will decrease Outcome: Progressing   Problem: Nutrition: Goal: Adequate nutrition will be maintained Outcome: Progressing   Problem: Coping: Goal: Level of anxiety will decrease Outcome: Progressing   Problem: Elimination: Goal: Will not experience complications related to bowel motility Outcome: Progressing Goal: Will not experience complications related to urinary retention Outcome: Progressing   Problem: Pain Managment: Goal: General experience of comfort will improve Outcome: Progressing   Problem: Safety: Goal: Ability to remain free from injury will improve Outcome: Progressing   Problem: Skin Integrity: Goal: Risk for impaired skin integrity will decrease Outcome: Progressing   Problem: Education: Goal: Knowledge of disease or condition will improve Outcome: Progressing Goal: Knowledge of secondary prevention will improve (MUST DOCUMENT ALL) Outcome: Progressing Goal: Knowledge of patient specific risk factors will improve Loraine Leriche N/A or DELETE if not current risk factor) Outcome: Progressing   Problem: Ischemic Stroke/TIA Tissue Perfusion: Goal: Complications of ischemic stroke/TIA will be minimized Outcome: Progressing   Problem: Coping: Goal: Will verbalize positive feelings about self Outcome: Progressing Goal: Will identify appropriate support needs Outcome: Progressing   Problem: Health Behavior/Discharge Planning: Goal: Ability to manage health-related needs will improve Outcome: Progressing Goal: Goals will be collaboratively established with patient/family Outcome: Progressing   Problem: Self-Care: Goal: Ability to participate in self-care as condition permits will improve Outcome: Progressing Goal: Verbalization of feelings and concerns over difficulty with self-care will improve Outcome: Progressing Goal: Ability to communicate needs accurately will  improve Outcome: Progressing   Problem: Nutrition: Goal: Risk of aspiration will decrease Outcome: Progressing Goal: Dietary intake will improve Outcome: Progressing   Problem: Education: Goal: Knowledge of disease or condition will improve Outcome: Progressing Goal: Knowledge of secondary prevention will improve (MUST DOCUMENT ALL) Outcome: Progressing Goal: Knowledge of patient specific risk factors will improve Loraine Leriche N/A or DELETE if not current risk factor) Outcome: Progressing   Problem: Ischemic Stroke/TIA Tissue Perfusion: Goal: Complications of ischemic stroke/TIA will be minimized Outcome: Progressing   Problem: Coping: Goal: Will  verbalize positive feelings about self Outcome: Progressing Goal: Will identify appropriate support needs Outcome: Progressing   Problem: Health Behavior/Discharge Planning: Goal: Ability to manage health-related needs will improve Outcome: Progressing Goal: Goals will be collaboratively established with patient/family Outcome: Progressing

## 2023-02-09 DIAGNOSIS — R569 Unspecified convulsions: Secondary | ICD-10-CM | POA: Diagnosis not present

## 2023-02-09 LAB — GLUCOSE, CAPILLARY: Glucose-Capillary: 101 mg/dL — ABNORMAL HIGH (ref 70–99)

## 2023-02-09 LAB — CBC
HCT: 42.1 % (ref 39.0–52.0)
Hemoglobin: 13.8 g/dL (ref 13.0–17.0)
MCH: 30.5 pg (ref 26.0–34.0)
MCHC: 32.8 g/dL (ref 30.0–36.0)
MCV: 93.1 fL (ref 80.0–100.0)
Platelets: 176 10*3/uL (ref 150–400)
RBC: 4.52 MIL/uL (ref 4.22–5.81)
RDW: 14.6 % (ref 11.5–15.5)
WBC: 6.3 10*3/uL (ref 4.0–10.5)
nRBC: 0 % (ref 0.0–0.2)

## 2023-02-09 LAB — BASIC METABOLIC PANEL
Anion gap: 19 — ABNORMAL HIGH (ref 5–15)
BUN: 54 mg/dL — ABNORMAL HIGH (ref 6–20)
CO2: 24 mmol/L (ref 22–32)
Calcium: 8.7 mg/dL — ABNORMAL LOW (ref 8.9–10.3)
Chloride: 91 mmol/L — ABNORMAL LOW (ref 98–111)
Creatinine, Ser: 11.98 mg/dL — ABNORMAL HIGH (ref 0.61–1.24)
GFR, Estimated: 5 mL/min — ABNORMAL LOW (ref >60)
Glucose, Bld: 83 mg/dL (ref 70–99)
Potassium: 4.2 mmol/L (ref 3.5–5.1)
Sodium: 134 mmol/L — ABNORMAL LOW (ref 135–145)

## 2023-02-09 NOTE — Progress Notes (Signed)
PROGRESS NOTE [name] [name]  ZOX:[id number] DOB: Dec 05, 1980 DOA: 01/18/2023 PCP: No primary care provider on file.  Brief Narrative/Hospital Course: 30M w/ hx seizure d/o, HTN, ESRD on HD MWF, SLE lupus nephritis, HLD was brought to the ED on 01/18/2023 after found down at fast food restaurant following a seizure and subsequent altered mental status, was admitted in ICU on 01/18/23 with resultant hypoxic respiratory failure, was intubated 8/2:admitted after seizure with resultant hypoxic respiratory failure -intubation 8/3 HD initiated for volume overload -stopped due to hypotension 8/3 EEG noted diffuse slowing but no seizure or epileptogenicity 8/5 extubated -found to have right arm weakness/unable to lift right arm above shoulder 8/6 TRH assumed care - transferred to Med/Surg bed  8/10 MRI with acute stroke  8/13 pending SNF placement.    Subjective: Patient seen examined this morning He is resting comfortably he is alert awake oriented x 3  Follows commands and has been ambulatory   Assessment and Plan: Principal Problem:   Seizure (HCC) Active Problems:   Acute respiratory failure with hypoxia (HCC)   Seizure disorder: In the setting of poor compliance with AEDs presenting with the ED after seizure.   Continue Keppra, seizure precaution> continue driving restriction uintil seizure-free for 6 months per [place] DMV   Acute cerebellar stroke SLE antiphospholipid syndrome: Completed stroke workup with MRI brain and MRA head carotid ultrasound echo with EF 55 to 60% severe LVH, LDL 105 HbA1c 5.2 etiology likely hypercoagulable disorder from SLE/antiphospholipid antibody syndrome versus cardio embolic.  Neurology has seen patient.  Recommended switching Eliquis to Lovenox although per pharmacy not a great recommendation given his ESRD status, subsequently switched to Coumadin INR at goal 2-3.  Pharmacy monitoring.  Needs to go back to see rheumatology not seen since 2022. Coumadin may not be  ideal option if he gets discharged without permanent facility like SNF since he has poor adherence per pharmacy.  Patient reports he will be taking his medication and will follow-up with his doctor.  HD clinic will not manage his Coumadin Recent Labs  Lab 02/03/23 0539 02/05/23 0413 02/06/23 1612 02/07/23 0026 02/08/23 0559  INR 2.0* 2.3* 2.4* 2.3* 2.3*    Compressive cervical myelopathy: MR C-spine with C5-6 herniated disc, spinal stenosis and cervical myopathy. Seen by neurosurgery, Dr. Lovell Sheehan on 01/23/2023 with recommendation of a C5-6 anterior cervicectomy fusion and plating; plans for outpatient follow-up in 2 weeks.   Shock secondary to sepsis versus radiation-induced > on vasopressor transiently during intubation.  Resolved  Acute hypoxic respiratory failure  Aspiration pneumonitis : Aspiration following seizure, improved, completed antibiotics, doing well on room air afebrile   ESRD on HD MWF: Nephrology following closely cont HD as per schedule. Anion gap metabolic acidosis in the setting of ESRD monitor. Anemia of chronic kidney disease: Continue to monitor hemoglobin ESA on hold Secondary hyperparathyroidism: Calcium okay Phos high but improving continue stable limits Sensipar  Hypertension Autonomic dysfunction: Coreg discontinued and started on midodrine -blood pressure at times soft  HLD: Stable, continue statins  Metabolic encephalopathy: Likely in the setting of seizure and hypoxia.  Clinically improved alert and oriented x 3 Continue delirium precaution fall precautions supportive care  Chronic thrombocytopenia: Monitor  Weakness/deconditioning/debility: Continue PT OT and needs SNF  DVT prophylaxis: SCDs Start: 01/19/23 0022 on coumadin Code Status:   Code Status: Full Code Family Communication: plan of care discussed with patient at bedside. Patient status is: Inpatient because of awaiting placement unsafe disposition Level of care: Med-Surg   Dispo:  The patient is from: Reports by himself has room partner.  States He has mother            Anticipated disposition: SNF pending.  Objective: Vitals last 24 hrs: Vitals:   02/08/23 2128 02/09/23 0500 02/09/23 0609 02/09/23 0755  BP: 96/62  (!) 83/65 92/71  Pulse: 72  76 75  Resp: 18  18 18   Temp: (!) 97.4 F (36.3 C)  98.6 F (37 C) (!) 97.5 F (36.4 C)  TempSrc: Oral  Oral Oral  SpO2: 98%  100% 99%  Weight:  102 kg    Height:       Weight change:   Physical Examination: General exam: alert awake, oriented  HEENT:Oral mucosa moist, Ear/Nose WNL grossly Respiratory system: Bilaterally clear BS,no use of accessory muscle Cardiovascular system: S1 & S2 +, No JVD. Gastrointestinal system: Abdomen soft,NT,ND, BS+ Nervous System: Alert, awake, moving all extremities,and following commands. Extremities: LE edema neg,distal peripheral pulses palpable and warm.  Skin: No rashes,no icterus. MSK: Normal muscle bulk,tone, power  HD CATHETER ON CHEST  Medications reviewed:  Scheduled Meds:  atorvastatin  40 mg Oral Daily   Chlorhexidine Gluconate Cloth  6 each Topical Q0600   cinacalcet  60 mg Oral Q supper   levETIRAcetam  500 mg Oral BID   midodrine  5 mg Oral TID WC   multivitamin  1 tablet Oral QHS   sevelamer carbonate  2,400 mg Oral TID WC   warfarin  5 mg Oral q1600   Warfarin - Pharmacist Dosing Inpatient   Does not apply q1600   Continuous Infusions:  sodium chloride 10 mL/hr at 01/23/23 1400    Diet Order             Diet 2 gram sodium Room service appropriate? Yes with Assist; Fluid consistency: Thin; Fluid restriction: 1200 mL Fluid  Diet effective now                 Nutrition Problem: Inadequate oral intake Etiology: inability to eat Signs/Symptoms: NPO status Interventions: Refer to RD note for recommendations   Intake/Output Summary (Last 24 hours) at 02/09/2023 0838 Last data filed at 02/08/2023 1512 Gross per 24 hour  Intake --  Output 2000 ml   Net -2000 ml   Net IO Since Admission: -69,629.37 mL [02/09/23 0838]  Wt Readings from Last 3 Encounters:  02/09/23 102 kg   Unresulted Labs (From admission, onward)     Start     Ordered   02/11/23 0500  Protime-INR  Every Mon-Wed-Fri (0500),   R     Question:  Specimen collection method  Answer:  Lab=Lab collect   02/08/23 0936          Data Reviewed: I have personally reviewed following labs and imaging studies Recent Labs  Lab 02/04/23 1101 02/06/23 1006 02/08/23 0954 02/09/23 0411  WBC 6.7 6.2 5.9 6.3  HGB 13.6 12.4* 13.0 13.8  HCT 41.0 38.3* 40.3 42.1  MCV 94.3 94.1 93.9 93.1  PLT 145* 144* 163 176  Basic Metabolic Panel: Recent Labs  Lab 02/04/23 1101 02/06/23 1007 02/08/23 0559 02/09/23 0411  NA 133* 133* 133* 134*  K 4.5 4.8 4.6 4.2  CL 92* 94* 92* 91*  CO2 22 22 23 24   GLUCOSE 96 84 81 83  BUN 70* 71* 80* 54*  CREATININE 16.55* 16.22* 16.12* 11.98*  CALCIUM 8.9 8.6* 8.5* 8.7*  PHOS 8.2* 7.9* 8.5*  --    No results for input(s): "GLUCAP" in  the last 168 hours.  No results found for this or any previous visit (from the past 240 hour(s)).  Antimicrobials: Anti-infectives (From admission, onward)    Start     Dose/Rate Route Frequency Ordered Stop   01/20/23 1745  ampicillin-sulbactam (UNASYN) 1.5 g in sodium chloride 0.9 % 100 mL IVPB        1.5 g 200 mL/hr over 30 Minutes Intravenous Every 12 hours 01/20/23 1019 01/23/23 1814   01/19/23 0530  ampicillin-sulbactam (UNASYN) 1.5 g in sodium chloride 0.9 % 100 mL IVPB  Status:  Discontinued        1.5 g 200 mL/hr over 30 Minutes Intravenous Every 12 hours 01/19/23 0445 01/20/23 1019   01/18/23 2230  vancomycin (VANCOCIN) IVPB 1000 mg/200 mL premix  Status:  Discontinued        1,000 mg 200 mL/hr over 60 Minutes Intravenous  Once 01/18/23 2218 01/18/23 2222   01/18/23 2230  metroNIDAZOLE (FLAGYL) IVPB 500 mg        500 mg 100 mL/hr over 60 Minutes Intravenous  Once 01/18/23 2218 01/19/23 0047    01/18/23 2230  ceFEPIme (MAXIPIME) 2 g in sodium chloride 0.9 % 100 mL IVPB        2 g 200 mL/hr over 30 Minutes Intravenous  Once 01/18/23 2222 01/18/23 2323   01/18/23 2230  vancomycin (VANCOREADY) IVPB 2000 mg/400 mL        2,000 mg 200 mL/hr over 120 Minutes Intravenous  Once 01/18/23 2222 01/19/23 0415      Culture/Microbiology    Component Value Date/Time   SDES BLOOD RIGHT HAND 01/19/2023 0213   SPECREQUEST  01/19/2023 5784    BOTTLES DRAWN AEROBIC AND ANAEROBIC Blood Culture results may not be optimal due to an excessive volume of blood received in culture bottles   CULT  01/19/2023 0213    NO GROWTH 5 DAYS Performed at [facility], [address], [place], [place] [address]    REPTSTATUS 01/24/2023 FINAL 01/19/2023 5284    Radiology Studies: No results found.   LOS: 21 days   Lanae Boast, MD [facility]  02/09/2023, 8:38 AM

## 2023-02-09 NOTE — Progress Notes (Signed)
Subjective: In room sitting upright in bed, no complaints said tolerated dialysis yesterday on schedule.  Noted awaiting rehab center/SNF placement  Objective Vital signs in last 24 hours: Vitals:   02/08/23 2128 02/09/23 0500 02/09/23 0609 02/09/23 0755  BP: 96/62  (!) 83/65 92/71  Pulse: 72  76 75  Resp: 18  18 18   Temp: (!) 97.4 F (36.3 C)  98.6 F (37 C) (!) 97.5 F (36.4 C)  TempSrc: Oral  Oral Oral  SpO2: 98%  100% 99%  Weight:  102 kg    Height:       Weight change:   Physical Exam: General: alert nad, RM air  Heart: RRR, No mrg Lungs: CTAb  non labored  rm air Abdomen: NABS , soft, Nt, nd Extremities: no pedal edema Dialysis Access: R internal jugular TDC     OP Dialysis Orders: [facility] MWF 4h   400/800 112.8kg  2/2 bath  TDC  Heparin none - Mircera 120 mcg IV q 2 weeks (last dose 01/16/2023) - Venofer 50 mg IV q week   Assessment/Plan: Seizures: Had not been taking OP meds. Found down at [employer]. Improved now. Acute CVA per MRI done 8/10 w/ cerebellar CVA w/o hemorrhage. Neuro switched Eliquis to warfarin. FYI - dialysis clinic will NOT manage his INR, needs PCP to do this. Acute hypoxic respiratory failure: Pulm edema + possible aspiration PNA on admit, better now. BP/volume: BP low sided (on mido 5mg  TID), now well below prior EDW - will lower on d/c (~101kg). Amlod on hold. ESRD: Continue HD on usual MWF schedule on schedule. Anemia: Hgb down to 7's earlier this admit - now > 13.8  ES[facility] on hold. Secondary HPTH: Ca ok, Phos  8.5  Continue sevelamer, sensipar 60mg  every day.  Nutrition: Alb 3.8 , continue renal/high protein diet, and on  supps.  [name] Pastel, PA-C [facility] [phone] 02/09/2023,12:25 PM  LOS: 21 days   Labs: Basic Metabolic Panel: Recent Labs  Lab 02/04/23 1101 02/06/23 1007 02/08/23 0559 02/09/23 0411  NA 133* 133* 133* 134*  K 4.5 4.8 4.6 4.2  CL 92* 94* 92* 91*  CO2 22 22 23 24   GLUCOSE 96 84  81 83  BUN 70* 71* 80* 54*  CREATININE 16.55* 16.22* 16.12* 11.98*  CALCIUM 8.9 8.6* 8.5* 8.7*  PHOS 8.2* 7.9* 8.5*  --    Liver Function Tests: Recent Labs  Lab 02/04/23 1101 02/06/23 1007 02/08/23 0559  ALBUMIN 3.8 3.5 3.8   No results for input(s): "LIPASE", "AMYLASE" in the last 168 hours. No results for input(s): "AMMONIA" in the last 168 hours. CBC: Recent Labs  Lab 02/04/23 1101 02/06/23 1006 02/08/23 0954 02/09/23 0411  WBC 6.7 6.2 5.9 6.3  HGB 13.6 12.4* 13.0 13.8  HCT 41.0 38.3* 40.3 42.1  MCV 94.3 94.1 93.9 93.1  PLT 145* 144* 163 176   Cardiac Enzymes: No results for input(s): "CKTOTAL", "CKMB", "CKMBINDEX", "TROPONINI" in the last 168 hours. CBG: No results for input(s): "GLUCAP" in the last 168 hours.  Studies/Results: No results found. Medications:  sodium chloride 10 mL/hr at 01/23/23 1400    atorvastatin  40 mg Oral Daily   Chlorhexidine Gluconate Cloth  6 each Topical Q0600   cinacalcet  60 mg Oral Q supper   levETIRAcetam  500 mg Oral BID   midodrine  5 mg Oral TID WC   multivitamin  1 tablet Oral QHS   sevelamer carbonate  2,400 mg Oral TID WC  warfarin  5 mg Oral q1600   Warfarin - Pharmacist Dosing Inpatient   Does not apply q1600

## 2023-02-09 NOTE — Plan of Care (Signed)
 Problem: Education: Goal: Ability to describe self-care measures that may prevent or decrease complications (Diabetes Survival Skills Education) will improve Outcome: Progressing Goal: Individualized Educational Video(s) Outcome: Progressing   Problem: Coping: Goal: Ability to adjust to condition or change in health will improve Outcome: Progressing   Problem: Fluid Volume: Goal: Ability to maintain a balanced intake and output will improve Outcome: Progressing   Problem: Health Behavior/Discharge Planning: Goal: Ability to identify and utilize available resources and services will improve Outcome: Progressing Goal: Ability to manage health-related needs will improve Outcome: Progressing   Problem: Metabolic: Goal: Ability to maintain appropriate glucose levels will improve Outcome: Progressing   Problem: Nutritional: Goal: Maintenance of adequate nutrition will improve Outcome: Progressing Goal: Progress toward achieving an optimal weight will improve Outcome: Progressing   Problem: Skin Integrity: Goal: Risk for impaired skin integrity will decrease Outcome: Progressing   Problem: Tissue Perfusion: Goal: Adequacy of tissue perfusion will improve Outcome: Progressing   Problem: Education: Goal: Knowledge of General Education information will improve Description: Including pain rating scale, medication(s)/side effects and non-pharmacologic comfort measures Outcome: Progressing   Problem: Health Behavior/Discharge Planning: Goal: Ability to manage health-related needs will improve Outcome: Progressing   Problem: Clinical Measurements: Goal: Ability to maintain clinical measurements within normal limits will improve Outcome: Progressing Goal: Will remain free from infection Outcome: Progressing Goal: Diagnostic test results will improve Outcome: Progressing Goal: Respiratory complications will improve Outcome: Progressing Goal: Cardiovascular complication will  be avoided Outcome: Progressing   Problem: Activity: Goal: Risk for activity intolerance will decrease Outcome: Progressing   Problem: Nutrition: Goal: Adequate nutrition will be maintained Outcome: Progressing   Problem: Coping: Goal: Level of anxiety will decrease Outcome: Progressing   Problem: Elimination: Goal: Will not experience complications related to bowel motility Outcome: Progressing Goal: Will not experience complications related to urinary retention Outcome: Progressing   Problem: Pain Managment: Goal: General experience of comfort will improve Outcome: Progressing   Problem: Safety: Goal: Ability to remain free from injury will improve Outcome: Progressing   Problem: Skin Integrity: Goal: Risk for impaired skin integrity will decrease Outcome: Progressing   Problem: Education: Goal: Knowledge of disease or condition will improve Outcome: Progressing Goal: Knowledge of secondary prevention will improve (MUST DOCUMENT ALL) Outcome: Progressing Goal: Knowledge of patient specific risk factors will improve Loraine Leriche N/A or DELETE if not current risk factor) Outcome: Progressing   Problem: Ischemic Stroke/TIA Tissue Perfusion: Goal: Complications of ischemic stroke/TIA will be minimized Outcome: Progressing   Problem: Coping: Goal: Will verbalize positive feelings about self Outcome: Progressing Goal: Will identify appropriate support needs Outcome: Progressing   Problem: Health Behavior/Discharge Planning: Goal: Ability to manage health-related needs will improve Outcome: Progressing Goal: Goals will be collaboratively established with patient/family Outcome: Progressing   Problem: Self-Care: Goal: Ability to participate in self-care as condition permits will improve Outcome: Progressing Goal: Verbalization of feelings and concerns over difficulty with self-care will improve Outcome: Progressing Goal: Ability to communicate needs accurately will  improve Outcome: Progressing   Problem: Nutrition: Goal: Risk of aspiration will decrease Outcome: Progressing Goal: Dietary intake will improve Outcome: Progressing   Problem: Education: Goal: Knowledge of disease or condition will improve Outcome: Progressing Goal: Knowledge of secondary prevention will improve (MUST DOCUMENT ALL) Outcome: Progressing Goal: Knowledge of patient specific risk factors will improve Loraine Leriche N/A or DELETE if not current risk factor) Outcome: Progressing   Problem: Ischemic Stroke/TIA Tissue Perfusion: Goal: Complications of ischemic stroke/TIA will be minimized Outcome: Progressing   Problem: Coping: Goal: Will  verbalize positive feelings about self Outcome: Progressing Goal: Will identify appropriate support needs Outcome: Progressing   Problem: Health Behavior/Discharge Planning: Goal: Ability to manage health-related needs will improve Outcome: Progressing Goal: Goals will be collaboratively established with patient/family Outcome: Progressing

## 2023-02-10 DIAGNOSIS — R569 Unspecified convulsions: Secondary | ICD-10-CM | POA: Diagnosis not present

## 2023-02-10 NOTE — Progress Notes (Signed)
PROGRESS NOTE MAI NICOLICH  ZOX:[id number] DOB: 12/31/1980 DOA: 01/18/2023 PCP: No primary care provider on file.  Brief Narrative/Hospital Course: 69M w/ hx seizure d/o, HTN, ESRD on HD MWF, SLE lupus nephritis, HLD was brought to the ED on 01/18/2023 after found down at fast food restaurant following a seizure and subsequent altered mental status, was admitted in ICU on 01/18/23 with resultant hypoxic respiratory failure, was intubated 8/2:admitted after seizure with resultant hypoxic respiratory failure -intubation 8/3 HD initiated for volume overload -stopped due to hypotension 8/3 EEG noted diffuse slowing but no seizure or epileptogenicity 8/5 extubated -found to have right arm weakness/unable to lift right arm above shoulder 8/6 TRH assumed care - transferred to Med/Surg bed  8/10 MRI with acute stroke  8/13 pending SNF placement.    Subjective: Patient seen and examined this morning Alert awake oriented x 3 denies any complaints " I am ready to go" Informed that we are awaiting snf  Assessment and Plan: Principal Problem:   Seizure (HCC) Active Problems:   Acute respiratory failure with hypoxia (HCC)   Seizure disorder: In the setting of poor compliance with AEDs presenting with seizure.  Seen by neurology.History moderate diffuse slowing no electrographic seizure. Continue Keppra, seizure precaution> continue driving restriction uintil seizure-free for 6 months per [place] DMV   Acute cerebellar stroke SLE antiphospholipid syndrome: Completed stroke workup with MRI brain and MRA head carotid ultrasound echo with EF 55 to 60% severe LVH, LDL 105 HbA1c 5.2 etiology likely hypercoagulable disorder from SLE/antiphospholipid antibody syndrome versus cardio embolic.  Neurology has seen patient.  Recommended switching Eliquis to Lovenox due not recommended given his ESRD status> subsequently switched to Coumadin w/ INR at goal 2-3.  Remains therapeutic, pharmacy managing. Needs to go back to  see rheumatology not seen since 2022. Coumadin may not be ideal option if he gets discharged without permanent facility like SNF??since he has poor adherence per pharmacy.  Patient able to verbalize he needs to be on Coumadin and aware that Levevl need monitoring.HD clinic will not manage his Coumadin per nephrology. Recent Labs  Lab 02/05/23 0413 02/06/23 1612 02/07/23 0026 02/08/23 0559  INR 2.3* 2.4* 2.3* 2.3*    Compressive cervical myelopathy: MR C-spine with C5-6 herniated disc, spinal stenosis and cervical myopathy. Seen by neurosurgery, Dr. Lovell Sheehan on 01/23/2023 with recommendation of a C5-6 anterior cervicectomy fusion and plating; plans for outpatient follow-up in 2 weeks.   Shock secondary to sepsis versus radiation-induced > on vasopressor transiently during intubation.  Resolved  Acute hypoxic respiratory failure  Aspiration pneumonitis : Aspiration following seizure> resolved and completed antibiotics, doing well on room air afebrile   ESRD on HD MWF: Nephrology following closely cont HD as per schedule last HD friday.. Anion gap metabolic acidosis in the setting of ESRD monitor and cont HD. Anemia of chronic kidney disease: Continue to monitor hemoglobin- last level normal 12-13.ESA on hold Secondary hyperparathyroidism: Calcium okay Phos high but improving continue stable limits Sensipar  Hypertension Autonomic dysfunction: Coreg discontinued and started on midodrine -blood pressure at times soft, continue to monitor  HLD: Stable, continue statins  Metabolic encephalopathy: Likely in the setting of seizure and hypoxia.  Clinically improved alert and oriented x 3 Continue delirium precaution fall precautions supportive care  Chronic thrombocytopenia: Monitor  Weakness/deconditioning/debility: Continue PT OT and needs SNF  DVT prophylaxis: SCDs Start: 01/19/23 0022 on coumadin Code Status:   Code Status: Full Code Family Communication: plan of care discussed with  patient at bedside. Patient  status is: Inpatient because of awaiting placement unsafe disposition Level of care: Med-Surg   Dispo: The patient is from: Reports by himself has room partner.  States He has mother            Anticipated disposition: SNF pending.  Objective: Vitals last 24 hrs: Vitals:   02/09/23 1950 02/10/23 0501 02/10/23 0552 02/10/23 0740  BP: 94/72 116/68  117/73  Pulse: 73 61  (!) 58  Resp: 18 17  17   Temp: 98.4 F (36.9 C) 98.2 F (36.8 C)  98.2 F (36.8 C)  TempSrc: Oral Oral    SpO2: 95% 97%  99%  Weight:   103 kg   Height:       Weight change: -1 kg  Physical Examination: General exam: alert awake, oriented x 3 HEENT:Oral mucosa moist, Ear/Nose WNL grossly Respiratory system: Bilaterally clear BS,no use of accessory muscle Cardiovascular system: S1 & S2 +, No JVD. Gastrointestinal system: Abdomen soft,NT,ND, BS+ Nervous System: Alert, awake, moving all extremities,and following commands. Extremities: LE edema neg,distal peripheral pulses palpable and warm.  Skin: No rashes,no icterus. MSK: Normal muscle bulk,tone, power  HD catheter on the chest  Medications reviewed:  Scheduled Meds:  atorvastatin  40 mg Oral Daily   Chlorhexidine Gluconate Cloth  6 each Topical Q0600   cinacalcet  60 mg Oral Q supper   levETIRAcetam  500 mg Oral BID   midodrine  5 mg Oral TID WC   multivitamin  1 tablet Oral QHS   sevelamer carbonate  2,400 mg Oral TID WC   warfarin  5 mg Oral q1600   Warfarin - Pharmacist Dosing Inpatient   Does not apply q1600   Continuous Infusions:  sodium chloride 10 mL/hr at 01/23/23 1400    Diet Order             Diet 2 gram sodium Room service appropriate? Yes with Assist; Fluid consistency: Thin; Fluid restriction: 1200 mL Fluid  Diet effective now                 Nutrition Problem: Inadequate oral intake Etiology: inability to eat Signs/Symptoms: NPO status Interventions: Refer to RD note for  recommendations   Intake/Output Summary (Last 24 hours) at 02/10/2023 0809 Last data filed at 02/09/2023 1330 Gross per 24 hour  Intake 480 ml  Output --  Net 480 ml   Net IO Since Admission: -17,462.37 mL [02/10/23 0809]  Wt Readings from Last 3 Encounters:  02/10/23 103 kg   Unresulted Labs (From admission, onward)     Start     Ordered   02/11/23 0500  Protime-INR  Every Mon-Wed-Fri (0500),   R     Question:  Specimen collection method  Answer:  Lab=Lab collect   02/08/23 0936          Data Reviewed: I have personally reviewed following labs and imaging studies Recent Labs  Lab 02/04/23 1101 02/06/23 1006 02/08/23 0954 02/09/23 0411  WBC 6.7 6.2 5.9 6.3  HGB 13.6 12.4* 13.0 13.8  HCT 41.0 38.3* 40.3 42.1  MCV 94.3 94.1 93.9 93.1  PLT 145* 144* 163 176  Basic Metabolic Panel: Recent Labs  Lab 02/04/23 1101 02/06/23 1007 02/08/23 0559 02/09/23 0411  NA 133* 133* 133* 134*  K 4.5 4.8 4.6 4.2  CL 92* 94* 92* 91*  CO2 22 22 23 24   GLUCOSE 96 84 81 83  BUN 70* 71* 80* 54*  CREATININE 16.55* 16.22* 16.12* 11.98*  CALCIUM 8.9 8.6* 8.5*  8.7*  PHOS 8.2* 7.9* 8.5*  --    Recent Labs  Lab 02/09/23 1639  GLUCAP 101*    No results found for this or any previous visit (from the past 240 hour(s)).  Antimicrobials: Anti-infectives (From admission, onward)    Start     Dose/Rate Route Frequency Ordered Stop   01/20/23 1745  ampicillin-sulbactam (UNASYN) 1.5 g in sodium chloride 0.9 % 100 mL IVPB        1.5 g 200 mL/hr over 30 Minutes Intravenous Every 12 hours 01/20/23 1019 01/23/23 1814   01/19/23 0530  ampicillin-sulbactam (UNASYN) 1.5 g in sodium chloride 0.9 % 100 mL IVPB  Status:  Discontinued        1.5 g 200 mL/hr over 30 Minutes Intravenous Every 12 hours 01/19/23 0445 01/20/23 1019   01/18/23 2230  vancomycin (VANCOCIN) IVPB 1000 mg/200 mL premix  Status:  Discontinued        1,000 mg 200 mL/hr over 60 Minutes Intravenous  Once 01/18/23 2218 01/18/23  2222   01/18/23 2230  metroNIDAZOLE (FLAGYL) IVPB 500 mg        500 mg 100 mL/hr over 60 Minutes Intravenous  Once 01/18/23 2218 01/19/23 0047   01/18/23 2230  ceFEPIme (MAXIPIME) 2 g in sodium chloride 0.9 % 100 mL IVPB        2 g 200 mL/hr over 30 Minutes Intravenous  Once 01/18/23 2222 01/18/23 2323   01/18/23 2230  vancomycin (VANCOREADY) IVPB 2000 mg/400 mL        2,000 mg 200 mL/hr over 120 Minutes Intravenous  Once 01/18/23 2222 01/19/23 0415      Culture/Microbiology    Component Value Date/Time   SDES BLOOD RIGHT HAND 01/19/2023 0213   SPECREQUEST  01/19/2023 8295    BOTTLES DRAWN AEROBIC AND ANAEROBIC Blood Culture results may not be optimal due to an excessive volume of blood received in culture bottles   CULT  01/19/2023 0213    NO GROWTH 5 DAYS Performed at [facility], [address], [place], [place] [address]    REPTSTATUS 01/24/2023 FINAL 01/19/2023 8657    Radiology Studies: No results found.   LOS: 22 days   Lanae Boast, MD [facility]  02/10/2023, 8:09 AM

## 2023-02-10 NOTE — Progress Notes (Signed)
Subjective:  alert , no cos , hd in am Normal schedule   Objective Vital signs in last 24 hours: Vitals:   02/09/23 1950 02/10/23 0501 02/10/23 0552 02/10/23 0740  BP: 94/72 116/68  117/73  Pulse: 73 61  (!) 58  Resp: 18 17  17   Temp: 98.4 F (36.9 C) 98.2 F (36.8 C)  98.2 F (36.8 C)  TempSrc: Oral Oral    SpO2: 95% 97%  99%  Weight:   103 kg   Height:       Weight change: -1 kg  Physical Exam: General: alert nad, RM air  Heart: RRR, No mrg Lungs: CTAb  non labored  rm air Abdomen: NABS , soft, Nt, nd Extremities: no pedal edema Dialysis Access: R internal jugular TDC     OP Dialysis Orders: [facility] MWF 4h   400/800 112.8kg  2/2 bath  TDC  Heparin none - Mircera 120 mcg IV q 2 weeks (last dose 01/16/2023) - Venofer 50 mg IV q week   Assessment/Plan: Seizures: Had not been taking OP meds. Found down at [employer]. Improved now. Acute CVA per MRI done 8/10 w/ cerebellar CVA w/o hemorrhage. Neuro switched Eliquis to warfarin. FYI - dialysis clinic will NOT manage his INR, needs PCP to do this. Acute hypoxic respiratory failure: Pulm edema + possible aspiration PNA on admit, better now. BP/volume: BP low sided (on mido 5mg  TID), now well below prior EDW - will lower on d/c (~101kg). Amlod on hold. ESRD: Continue HD on usual MWF schedule on schedule. Anemia: Hgb down to 7's earlier this admit - now > 13.8  ESA on hold. Secondary HPTH: Ca ok, Phos 8.5 (8/23) Continue sevelamer, sensipar 60mg  every day.  Nutrition: Alb 3.8 , continue renal/high protein diet, and on  supps.  Lenny Pastel, PA-C [facility] [phone] 02/10/2023,2:10 PM  LOS: 22 days   Labs: Basic Metabolic Panel: Recent Labs  Lab 02/04/23 1101 02/06/23 1007 02/08/23 0559 02/09/23 0411  NA 133* 133* 133* 134*  K 4.5 4.8 4.6 4.2  CL 92* 94* 92* 91*  CO2 22 22 23 24   GLUCOSE 96 84 81 83  BUN 70* 71* 80* 54*  CREATININE 16.55* 16.22* 16.12* 11.98*  CALCIUM 8.9 8.6* 8.5*  8.7*  PHOS 8.2* 7.9* 8.5*  --    Liver Function Tests: Recent Labs  Lab 02/04/23 1101 02/06/23 1007 02/08/23 0559  ALBUMIN 3.8 3.5 3.8   No results for input(s): "LIPASE", "AMYLASE" in the last 168 hours. No results for input(s): "AMMONIA" in the last 168 hours. CBC: Recent Labs  Lab 02/04/23 1101 02/06/23 1006 02/08/23 0954 02/09/23 0411  WBC 6.7 6.2 5.9 6.3  HGB 13.6 12.4* 13.0 13.8  HCT 41.0 38.3* 40.3 42.1  MCV 94.3 94.1 93.9 93.1  PLT 145* 144* 163 176   Cardiac Enzymes: No results for input(s): "CKTOTAL", "CKMB", "CKMBINDEX", "TROPONINI" in the last 168 hours. CBG: Recent Labs  Lab 02/09/23 1639  GLUCAP 101*    Studies/Results: No results found. Medications:  sodium chloride 10 mL/hr at 01/23/23 1400    atorvastatin  40 mg Oral Daily   Chlorhexidine Gluconate Cloth  6 each Topical Q0600   cinacalcet  60 mg Oral Q supper   levETIRAcetam  500 mg Oral BID   midodrine  5 mg Oral TID WC   multivitamin  1 tablet Oral QHS   sevelamer carbonate  2,400 mg Oral TID WC   warfarin  5 mg Oral q1600  Warfarin - Pharmacist Dosing Inpatient   Does not apply q1600

## 2023-02-11 DIAGNOSIS — R569 Unspecified convulsions: Secondary | ICD-10-CM | POA: Diagnosis not present

## 2023-02-11 LAB — CBC
HCT: 37.8 % — ABNORMAL LOW (ref 39.0–52.0)
Hemoglobin: 12.3 g/dL — ABNORMAL LOW (ref 13.0–17.0)
MCH: 30.2 pg (ref 26.0–34.0)
MCHC: 32.5 g/dL (ref 30.0–36.0)
MCV: 92.9 fL (ref 80.0–100.0)
Platelets: 167 10*3/uL (ref 150–400)
RBC: 4.07 MIL/uL — ABNORMAL LOW (ref 4.22–5.81)
RDW: 14.2 % (ref 11.5–15.5)
WBC: 6.6 10*3/uL (ref 4.0–10.5)
nRBC: 0 % (ref 0.0–0.2)

## 2023-02-11 LAB — RENAL FUNCTION PANEL
Albumin: 3.8 g/dL (ref 3.5–5.0)
Anion gap: 21 — ABNORMAL HIGH (ref 5–15)
BUN: 105 mg/dL — ABNORMAL HIGH (ref 6–20)
CO2: 19 mmol/L — ABNORMAL LOW (ref 22–32)
Calcium: 8.4 mg/dL — ABNORMAL LOW (ref 8.9–10.3)
Chloride: 94 mmol/L — ABNORMAL LOW (ref 98–111)
Creatinine, Ser: 17.75 mg/dL — ABNORMAL HIGH (ref 0.61–1.24)
GFR, Estimated: 3 mL/min — ABNORMAL LOW (ref >60)
Glucose, Bld: 85 mg/dL (ref 70–99)
Phosphorus: 10.8 mg/dL — ABNORMAL HIGH (ref 2.5–4.6)
Potassium: 5 mmol/L (ref 3.5–5.1)
Sodium: 134 mmol/L — ABNORMAL LOW (ref 135–145)

## 2023-02-11 LAB — PROTIME-INR
INR: 3.8 — ABNORMAL HIGH (ref 0.8–1.2)
Prothrombin Time: 37.3 s — ABNORMAL HIGH (ref 11.4–15.2)

## 2023-02-11 MED ORDER — HEPARIN SODIUM (PORCINE) 1000 UNIT/ML IJ SOLN
3200.0000 [IU] | Freq: Once | INTRAMUSCULAR | Status: AC
  Administered 2023-02-11: 3200 [IU]
  Filled 2023-02-11: qty 4

## 2023-02-11 NOTE — Progress Notes (Signed)
ANTICOAGULATION CONSULT NOTE  Pharmacy Consult for Warfarin  Indication:  antiphospholipid syndrome  Allergies  Allergen Reactions   Cetirizine & Related Swelling    Patient Measurements: Height: 6\' 1"  (185.4 cm) Weight: 101.5 kg (223 lb 12.3 oz) IBW/kg (Calculated) : 79.9 Enoxparin Dosing Weight: 102 kg  Vital Signs: Temp: 97.6 F (36.4 C) (08/26 1206) Temp Source: Oral (08/26 0734) BP: 115/95 (08/26 1206) Pulse Rate: 61 (08/26 1206)  Labs: Recent Labs    02/09/23 0411 02/11/23 0740  HGB 13.8 12.3*  HCT 42.1 37.8*  PLT 176 167  LABPROT  --  37.3*  INR  --  3.8*  CREATININE 11.98* 17.75*    Assessment: 41yoM with hx of lupus anticoagulant/antiphospholipid syndrome and was supposed to be on apixaban prior to admission, but last picked up Dec 2023 for 90 day supply.   Decision made to change long-term anticoagulation to warfarin on 8/13. Completed bridge therapy with Lovenox. Not currently on any interacting medications. CBC stable. INR supratherapeutic today at 3.8 (has been receiving standing dose of warfarin 5mg ).   Goal of Therapy:  INR 2-3 Monitor platelets by anticoagulation protocol: Yes   Plan:  HOLD warfarin dose today Change INR checks to daily until stable Continue to monitor H&H and platelets  Rexford Maus, PharmD, BCPS 02/11/2023 12:12 PM

## 2023-02-11 NOTE — Procedures (Signed)
Seen and examined on dialysis.  Procedure supervised.  Blood pressure 138/90 and HR 57.  RIJ tunn catheter in use.  Tolerating goal.  Feels well and alert and oriented x 3 for me.    Estanislado Emms, MD 02/11/2023 7:51 AM

## 2023-02-11 NOTE — Progress Notes (Signed)
PROGRESS NOTE [name] [name]  ONG:[id number] DOB: 1980/11/07 DOA: 01/18/2023 PCP: No primary care provider on file.  Brief Narrative/Hospital Course: 66M w/ hx seizure d/o, HTN, ESRD on HD MWF, SLE lupus nephritis, HLD was brought to the ED on 01/18/2023 after found down at fast food restaurant following a seizure and subsequent altered mental status, was admitted in ICU on 01/18/23 with resultant hypoxic respiratory failure, was intubated 8/2:admitted after seizure with resultant hypoxic respiratory failure -intubation 8/3 HD initiated for volume overload -stopped due to hypotension 8/3 EEG noted diffuse slowing but no seizure or epileptogenicity 8/5 extubated -found to have right arm weakness/unable to lift right arm above shoulder 8/6 TRH assumed care - transferred to Med/Surg bed  8/10 MRI with acute stroke  8/13 pending SNF placement.    Subjective: Patient seen and examined this morning in dialysis resting comfortably alert awake no complaints.   Overnight he is afebrile, BP stable.  Assessment and Plan: Principal Problem:   Seizure (HCC) Active Problems:   Acute respiratory failure with hypoxia (HCC)   Seizure disorder: In the setting of poor compliance with AEDs presenting with seizure.  Seen by neurology.History moderate diffuse slowing no electrographic seizure. Continue Keppra, seizure precaution> continue driving restriction uintil seizure-free for 6 months per [place] DMV   Acute cerebellar stroke SLE antiphospholipid syndrome: Completed stroke workup with MRI brain and MRA head carotid ultrasound echo with EF 55 to 60% severe LVH, LDL 105 HbA1c 5.2 etiology likely hypercoagulable disorder from SLE/antiphospholipid antibody syndrome versus cardio embolic.  Neurology has seen patient.  Recommended switching Eliquis to Lovenox due not recommended given his ESRD status> subsequently switched to Coumadin w/ INR at goal 2-3.  Remains therapeutic, pharmacy managing and is supratherapeutic as  below. Needs to go back to see rheumatology not seen since 2022. Coumadin may not be ideal option if he gets discharged without permanent facility like SNF??since he has poor adherence per pharmacy, but he is able to verbalize he needs to be on Coumadin and aware that Levevl needs monitoring. Per nephro HD clinic will not manage his Coumadin per nephrology. Recent Labs  Lab 02/05/23 0413 02/06/23 1612 02/07/23 0026 02/08/23 0559 02/11/23 0740  INR 2.3* 2.4* 2.3* 2.3* 3.8*    Compressive cervical myelopathy: MR C-spine with C5-6 herniated disc, spinal stenosis and cervical myopathy. Seen by neurosurgery, Dr. Lovell Sheehan on 01/23/2023 with recommendation of a C5-6 anterior cervicectomy fusion and plating; plans for outpatient follow-up in 2 weeks.   Shock secondary to sepsis versus radiation-induced > on vasopressor transiently during intubation.  Resolved  Acute hypoxic respiratory failure  Aspiration pneumonitis : Aspiration following seizure> resolved and completed antibiotics. On RA, stable  ESRD on HD MWF: Nephrology following- on HD 8/26 per schedule Anion gap metabolic acidosis in the setting of ESRD monitor and cont HD. Anemia of chronic kidney disease: Continue to monitor hemoglobin- last level normal 12-13.ESA on hold Secondary hyperparathyroidism: Calcium okay Phos high but improving continue stable limits Sensipar  Hypertension Autonomic dysfunction: BP stable no hypotension. Hi Coreg was discontinued and started on midodrine  HLD: Stable, continue statins  Metabolic encephalopathy: Likely in the setting of seizure and hypoxia.  Clinically improved alert and oriented x 3 a,d appropraite.  Chronic thrombocytopenia: Monitor  Weakness/deconditioning/debility: Continue PT OT and needs SNF  DVT prophylaxis: SCDs Start: 01/19/23 0022 on coumadin Code Status:   Code Status: Full Code Family Communication: plan of care discussed with patient at bedside. Patient status is:  Inpatient because of awaiting placement  unsafe disposition Level of care: Med-Surg   Dispo: The patient is from: Reports by himself has room partner.  States He has mother            Anticipated disposition: SNF pending appeal.  Objective: Vitals last 24 hrs: Vitals:   02/11/23 0830 02/11/23 0900 02/11/23 0930 02/11/23 1000  BP: (!) 131/98 (!) 133/94 (!) 122/94 (!) 133/93  Pulse: (!) 55 (!) 57 (!) 55 68  Resp: 11 12 10 14   Temp:      TempSrc:      SpO2: 98% 98% 96% 97%  Weight:      Height:       Weight change: 0.8 kg  Physical Examination: General exam: alert awake, oriented x3. HEENT:Oral mucosa moist, Ear/Nose WNL grossly. Respiratory system: Bilaterally clear BS,no use of accessory muscle. Cardiovascular system: S1 & S2 +, No JVD. Gastrointestinal system: Abdomen soft,NT,ND, BS+ Nervous System: Alert, awake, moving all extremities, and following commands. Extremities: LE edema neg,distal peripheral pulses palpable and warm.  Skin: No rashes,no icterus. MSK: Normal muscle bulk,tone, power   Medications reviewed:  Scheduled Meds:  atorvastatin  40 mg Oral Daily   Chlorhexidine Gluconate Cloth  6 each Topical Q0600   cinacalcet  60 mg Oral Q supper   levETIRAcetam  500 mg Oral BID   midodrine  5 mg Oral TID WC   multivitamin  1 tablet Oral QHS   sevelamer carbonate  2,400 mg Oral TID WC   warfarin  5 mg Oral q1600   Warfarin - Pharmacist Dosing Inpatient   Does not apply q1600   Continuous Infusions:  sodium chloride 10 mL/hr at 01/23/23 1400    Diet Order             Diet 2 gram sodium Room service appropriate? Yes with Assist; Fluid consistency: Thin; Fluid restriction: 1200 mL Fluid  Diet effective now                 Nutrition Problem: Inadequate oral intake Etiology: inability to eat Signs/Symptoms: NPO status Interventions: Refer to RD note for recommendations  No intake or output data in the 24 hours ending 02/11/23 1004  Net IO Since Admission:  -17,462.37 mL [02/11/23 1004]  Wt Readings from Last 3 Encounters:  02/11/23 104.5 kg   Unresulted Labs (From admission, onward)     Start     Ordered   02/11/23 0500  Protime-INR  Every Mon-Wed-Fri (0500),   R     Question:  Specimen collection method  Answer:  Lab=Lab collect   02/08/23 0936          Data Reviewed: I have personally reviewed following labs and imaging studies Recent Labs  Lab 02/04/23 1101 02/06/23 1006 02/08/23 0954 02/09/23 0411 02/11/23 0740  WBC 6.7 6.2 5.9 6.3 6.6  HGB 13.6 12.4* 13.0 13.8 12.3*  HCT 41.0 38.3* 40.3 42.1 37.8*  MCV 94.3 94.1 93.9 93.1 92.9  PLT 145* 144* 163 176 167  Basic Metabolic Panel: Recent Labs  Lab 02/04/23 1101 02/06/23 1007 02/08/23 0559 02/09/23 0411 02/11/23 0740  NA 133* 133* 133* 134* 134*  K 4.5 4.8 4.6 4.2 5.0  CL 92* 94* 92* 91* 94*  CO2 22 22 23 24  19*  GLUCOSE 96 84 81 83 85  BUN 70* 71* 80* 54* 105*  CREATININE 16.55* 16.22* 16.12* 11.98* 17.75*  CALCIUM 8.9 8.6* 8.5* 8.7* 8.4*  PHOS 8.2* 7.9* 8.5*  --  10.8*   Recent Labs  Lab 02/09/23  1639  GLUCAP 101*    No results found for this or any previous visit (from the past 240 hour(s)).  Antimicrobials: Anti-infectives (From admission, onward)    Start     Dose/Rate Route Frequency Ordered Stop   01/20/23 1745  ampicillin-sulbactam (UNASYN) 1.5 g in sodium chloride 0.9 % 100 mL IVPB        1.5 g 200 mL/hr over 30 Minutes Intravenous Every 12 hours 01/20/23 1019 01/23/23 1814   01/19/23 0530  ampicillin-sulbactam (UNASYN) 1.5 g in sodium chloride 0.9 % 100 mL IVPB  Status:  Discontinued        1.5 g 200 mL/hr over 30 Minutes Intravenous Every 12 hours 01/19/23 0445 01/20/23 1019   01/18/23 2230  vancomycin (VANCOCIN) IVPB 1000 mg/200 mL premix  Status:  Discontinued        1,000 mg 200 mL/hr over 60 Minutes Intravenous  Once 01/18/23 2218 01/18/23 2222   01/18/23 2230  metroNIDAZOLE (FLAGYL) IVPB 500 mg        500 mg 100 mL/hr over 60 Minutes  Intravenous  Once 01/18/23 2218 01/19/23 0047   01/18/23 2230  ceFEPIme (MAXIPIME) 2 g in sodium chloride 0.9 % 100 mL IVPB        2 g 200 mL/hr over 30 Minutes Intravenous  Once 01/18/23 2222 01/18/23 2323   01/18/23 2230  vancomycin (VANCOREADY) IVPB 2000 mg/400 mL        2,000 mg 200 mL/hr over 120 Minutes Intravenous  Once 01/18/23 2222 01/19/23 0415      Culture/Microbiology    Component Value Date/Time   SDES BLOOD RIGHT HAND 01/19/2023 0213   SPECREQUEST  01/19/2023 6045    BOTTLES DRAWN AEROBIC AND ANAEROBIC Blood Culture results may not be optimal due to an excessive volume of blood received in culture bottles   CULT  01/19/2023 0213    NO GROWTH 5 DAYS Performed at [facility], [address], [place], [place] [address]    REPTSTATUS 01/24/2023 FINAL 01/19/2023 1914    Radiology Studies: No results found.   LOS: 23 days   Lanae Boast, MD [facility]  02/11/2023, 10:04 AM

## 2023-02-11 NOTE — Progress Notes (Signed)
Received patient in bed to unit.  Alert and oriented.  Informed consent signed and in chart.   TX duration: 3 hours  Patient tolerated well.  Transported back to the room  Alert, without acute distress.  Hand-off given to patient's nurse.   Access used:  R Cath Access issues: none  Total UF removed: Medication(s) given: none   02/11/23 1206  Vitals  Temp 97.6 F (36.4 C)  BP (!) 115/95  MAP (mmHg) 103  Pulse Rate 61  Resp 13  Oxygen Therapy  SpO2 100 %  O2 Device Room Air     Stacie Glaze LPN [facility]

## 2023-02-11 NOTE — TOC Progression Note (Signed)
Transition of Care [facility]) - Progression Note    Patient Details  Name: [name] [name] MRN: [id number] Date of Birth: 1981/05/27  Transition [facility]) CM/SW Contact  Tassie Pollett A [place], [place] Phone Number: 02/11/2023, 3:05 PM  Clinical Narrative:     CSW contacted pt's friends with pt's phone.  Left voicemail with contact information to the following:  Cristal- [phone] Shawn- [phone]  Pt stated he reached out to all 3 of them over the weekend but had not received any updates. He stated that he could possibly stay with his friend Shawn at discharge.   TOC will continue to follow.   Expected Discharge Plan: OP Rehab Barriers to Discharge: No Barriers Identified  Expected Discharge Plan and Services   Discharge Planning Services: CM Consult Post Acute Care Choice: Durable Medical Equipment, Resumption of Svcs/PTA Provider Living arrangements for the past 2 months: Hotel/Motel                 DME Arranged: Walker rolling DME Agency: [employer] Date DME Agency Contacted: 01/25/23 Time DME Agency Contacted: [id number] Representative spoke with at DME Agency: Vaughan Basta, CM with [employer]             Social Determinants of Health (SDOH) Interventions SDOH Screenings   Food Insecurity: No Food Insecurity (02/03/2023)  Housing: Low Risk  (02/03/2023)  Transportation Needs: No Transportation Needs (02/03/2023)  Utilities: Not At Risk (02/03/2023)    Readmission Risk Interventions    01/25/2023    3:40 PM  Readmission Risk Prevention Plan  Post Dischage Appt Complete  Medication Screening Complete  Transportation Screening Complete

## 2023-02-11 NOTE — Progress Notes (Addendum)
  [facility] Progress Note   Subjective:   Seen on HD - 3L UFG and tolerating. No CP/dyspnea or other concerns. Awaiting SNF.  Objective Vitals:   02/11/23 0830 02/11/23 0900 02/11/23 0930 02/11/23 1000  BP: (!) 131/98 (!) 133/94 (!) 122/94 (!) 133/93  Pulse: (!) 55 (!) 57 (!) 55 68  Resp: 11 12 10 14   Temp:      TempSrc:      SpO2: 98% 98% 96% 97%  Weight:      Height:       Physical Exam General: Well appearing man, NAD. Room air Heart: RRR; no murmur Lungs: CTAB Abdomen: soft Extremities: no LE edema Dialysis Access:  TDC in R chest  Additional Objective Labs: Basic Metabolic Panel: Recent Labs  Lab 02/06/23 1007 02/08/23 0559 02/09/23 0411 02/11/23 0740  NA 133* 133* 134* 134*  K 4.8 4.6 4.2 5.0  CL 94* 92* 91* 94*  CO2 22 23 24  19*  GLUCOSE 84 81 83 85  BUN 71* 80* 54* 105*  CREATININE 16.22* 16.12* 11.98* 17.75*  CALCIUM 8.6* 8.5* 8.7* 8.4*  PHOS 7.9* 8.5*  --  10.8*   Liver Function Tests: Recent Labs  Lab 02/06/23 1007 02/08/23 0559 02/11/23 0740  ALBUMIN 3.5 3.8 3.8   CBC: Recent Labs  Lab 02/04/23 1101 02/06/23 1006 02/08/23 0954 02/09/23 0411 02/11/23 0740  WBC 6.7 6.2 5.9 6.3 6.6  HGB 13.6 12.4* 13.0 13.8 12.3*  HCT 41.0 38.3* 40.3 42.1 37.8*  MCV 94.3 94.1 93.9 93.1 92.9  PLT 145* 144* 163 176 167   Medications:  sodium chloride 10 mL/hr at 01/23/23 1400    atorvastatin  40 mg Oral Daily   Chlorhexidine Gluconate Cloth  6 each Topical Q0600   cinacalcet  60 mg Oral Q supper   levETIRAcetam  500 mg Oral BID   midodrine  5 mg Oral TID WC   multivitamin  1 tablet Oral QHS   sevelamer carbonate  2,400 mg Oral TID WC   warfarin  5 mg Oral q1600   Warfarin - Pharmacist Dosing Inpatient   Does not apply q1600    Dialysis Orders: [facility] MWF 4h   400/800 112.8kg  2/2 bath  TDC  Heparin none - Mircera 120 mcg IV q 2 weeks (last dose 01/16/2023) - Venofer 50 mg IV q week   Assessment/Plan: Seizures: Had not been  taking OP meds. Found down at [employer]. Improved now. Acute CVA per MRI done 8/10 w/ cerebellar CVA w/o hemorrhage. Neuro switched Eliquis to warfarin. FYI - dialysis clinic will NOT manage his INR, needs PCP to do this. Acute hypoxic respiratory failure: Pulm edema + possible aspiration PNA on admit, better now. BP/volume: BP low sided (on mido 5mg  TID), now well below prior EDW - will lower on d/c. Amlod on hold. ESRD: Continue HD on usual MWF schedule - HD now, 3L UFG. Anemia: Hgb down to 7's earlier this admit - now > 12. No ESA needed. Secondary HPTH: Ca ok, Phos higher - continue sevelamer  + cinacalcet.  Looks like missing binder at times. Nutrition: Alb 3.8, on renal/high protein diet.    Ozzie Hoyle, PA-C 02/11/2023, 10:22 AM  [facility]   Seen and examined independently.  Agree with note and exam as documented above by physician extender and as noted here.  See also my procedure note from today   Estanislado Emms, MD 02/11/2023  10:41 AM

## 2023-02-12 DIAGNOSIS — R569 Unspecified convulsions: Secondary | ICD-10-CM | POA: Diagnosis not present

## 2023-02-12 LAB — RENAL FUNCTION PANEL
Albumin: 3.9 g/dL (ref 3.5–5.0)
Anion gap: 18 — ABNORMAL HIGH (ref 5–15)
BUN: 71 mg/dL — ABNORMAL HIGH (ref 6–20)
CO2: 19 mmol/L — ABNORMAL LOW (ref 22–32)
Calcium: 8.6 mg/dL — ABNORMAL LOW (ref 8.9–10.3)
Chloride: 96 mmol/L — ABNORMAL LOW (ref 98–111)
Creatinine, Ser: 13.61 mg/dL — ABNORMAL HIGH (ref 0.61–1.24)
GFR, Estimated: 4 mL/min — ABNORMAL LOW (ref >60)
Glucose, Bld: 89 mg/dL (ref 70–99)
Phosphorus: 7.2 mg/dL — ABNORMAL HIGH (ref 2.5–4.6)
Potassium: 4.6 mmol/L (ref 3.5–5.1)
Sodium: 133 mmol/L — ABNORMAL LOW (ref 135–145)

## 2023-02-12 LAB — PROTIME-INR
INR: 2 — ABNORMAL HIGH (ref 0.8–1.2)
Prothrombin Time: 22.8 s — ABNORMAL HIGH (ref 11.4–15.2)

## 2023-02-12 MED ORDER — CHLORHEXIDINE GLUCONATE CLOTH 2 % EX PADS
6.0000 | MEDICATED_PAD | Freq: Every day | CUTANEOUS | Status: DC
  Administered 2023-02-12 – 2023-02-13 (×2): 6 via TOPICAL

## 2023-02-12 MED ORDER — WARFARIN SODIUM 5 MG PO TABS
5.0000 mg | ORAL_TABLET | Freq: Every day | ORAL | Status: DC
  Administered 2023-02-12: 5 mg via ORAL
  Filled 2023-02-12 (×2): qty 1

## 2023-02-12 MED ORDER — MIDODRINE HCL 5 MG PO TABS
5.0000 mg | ORAL_TABLET | ORAL | Status: AC
  Administered 2023-02-12: 5 mg via ORAL
  Filled 2023-02-12: qty 1

## 2023-02-12 NOTE — Progress Notes (Signed)
ANTICOAGULATION CONSULT NOTE  Pharmacy Consult for Warfarin  Indication:  antiphospholipid syndrome  Allergies  Allergen Reactions   Cetirizine & Related Swelling    Patient Measurements: Height: 6\' 1"  (185.4 cm) Weight: 102.5 kg (225 lb 15.5 oz) IBW/kg (Calculated) : 79.9 Enoxparin Dosing Weight: 102 kg  Vital Signs: Temp: 98 F (36.7 C) (08/27 0859) Temp Source: Oral (08/27 0859) BP: 113/72 (08/27 0859) Pulse Rate: 58 (08/27 0859)  Labs: Recent Labs    02/11/23 0740 02/12/23 0842  HGB 12.3*  --   HCT 37.8*  --   PLT 167  --   LABPROT 37.3* 22.8*  INR 3.8* 2.0*  CREATININE 17.75* 13.61*    Assessment: 41yoM with hx of lupus anticoagulant/antiphospholipid syndrome and was supposed to be on apixaban prior to admission, but last picked up Dec 2023 for 90 day supply.   Decision made to change long-term anticoagulation to warfarin on 8/13. Completed bridge therapy with Lovenox. Not currently on any interacting medications. CBC stable. I  INR today 2  Goal of Therapy:  INR 2-3 Monitor platelets by anticoagulation protocol: Yes   Plan:  Warfarin 5 mg po daily Change INR checks to daily until stable Continue to monitor H&H and platelets  Okey Regal, PharmD 02/12/2023 11:17 AM

## 2023-02-12 NOTE — Progress Notes (Signed)
Nutrition Follow-up  DOCUMENTATION CODES:   Not applicable  INTERVENTION:  - Continue 2gmNa diet, 1200 mL fluid.   NUTRITION DIAGNOSIS:   Inadequate oral intake related to inability to eat as evidenced by NPO status.  GOAL:   Patient will meet greater than or equal to 90% of their needs - Met, continue as goal.   MONITOR:   PO intake, Labs, I & O's, Supplement acceptance  REASON FOR ASSESSMENT:   Consult Enteral/tube feeding initiation and management  ASSESSMENT:   42 y.o. male presented to the ED with AMS and seizure like activity. PMH includes HTN, seizures, CVA, and ESRD on HD. Pt admitted with breakthrough seizures.  Meds reviewed: lipitor, MVI, renvela, warfarin. Labs reviewed: Na low, chloride low, BUN/Creatinine high, phos elevated.   Pt continues with good PO intakes. Per record, pt has been mostly eating 50-100% of his meals in the past week. The pt continues to meet his needs. Pt is pending SNF placement.   Diet Order:   Diet Order             Diet 2 gram sodium Room service appropriate? Yes with Assist; Fluid consistency: Thin; Fluid restriction: 1200 mL Fluid  Diet effective now                   EDUCATION NEEDS:   Not appropriate for education at this time  Skin:  Skin Assessment: Reviewed RN Assessment  Last BM:  8/26 - type 2  Height:   Ht Readings from Last 1 Encounters:  01/19/23 6\' 1"  (1.854 m)    Weight:   Wt Readings from Last 1 Encounters:  02/12/23 102.5 kg    Ideal Body Weight:  83.6 kg  BMI:  Body mass index is 29.81 kg/m.  Estimated Nutritional Needs:   Kcal:  2200-2500 kcal/d  Protein:  110-130g/d  Fluid:  1L + UOP  Julea Hutto Pump Back Bing, RD, LDN, CNSC.
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
## 2023-02-12 NOTE — Progress Notes (Addendum)
[facility] Progress Note   Subjective:   Seen in room - no CP/dyspnea, HD went fine yesterday. Awaiting SNF placement.  Objective Vitals:   02/12/23 0206 02/12/23 0500 02/12/23 0509 02/12/23 0859  BP: 91/72  (!) 88/72 113/72  Pulse: 86  71 (!) 58  Resp: 18  18 (!) 22  Temp: 98 F (36.7 C)  (!) 97.2 F (36.2 C) 98 F (36.7 C)  TempSrc: Oral  Oral Oral  SpO2: 100%  100% 100%  Weight:  102.5 kg    Height:       Physical Exam General: Well appearing man, NAD. Room air Heart: RRR; no murmur Lungs: CTAB Abdomen: soft Extremities: no LE edema Dialysis Access:  TDC in R chest  Additional Objective Labs: Basic Metabolic Panel: Recent Labs  Lab 02/06/23 1007 02/08/23 0559 02/09/23 0411 02/11/23 0740  NA 133* 133* 134* 134*  K 4.8 4.6 4.2 5.0  CL 94* 92* 91* 94*  CO2 22 23 24  19*  GLUCOSE 84 81 83 85  BUN 71* 80* 54* 105*  CREATININE 16.22* 16.12* 11.98* 17.75*  CALCIUM 8.6* 8.5* 8.7* 8.4*  PHOS 7.9* 8.5*  --  10.8*   Liver Function Tests: Recent Labs  Lab 02/06/23 1007 02/08/23 0559 02/11/23 0740  ALBUMIN 3.5 3.8 3.8   CBC: Recent Labs  Lab 02/06/23 1006 02/08/23 0954 02/09/23 0411 02/11/23 0740  WBC 6.2 5.9 6.3 6.6  HGB 12.4* 13.0 13.8 12.3*  HCT 38.3* 40.3 42.1 37.8*  MCV 94.1 93.9 93.1 92.9  PLT 144* 163 176 167   Medications:  sodium chloride 10 mL/hr at 01/23/23 1400    atorvastatin  40 mg Oral Daily   Chlorhexidine Gluconate Cloth  6 each Topical Q0600   cinacalcet  60 mg Oral Q supper   levETIRAcetam  500 mg Oral BID   midodrine  5 mg Oral TID WC   multivitamin  1 tablet Oral QHS   sevelamer carbonate  2,400 mg Oral TID WC   Warfarin - Pharmacist Dosing Inpatient   Does not apply q1600    Dialysis Orders: [facility] MWF 4h   400/800 112.8kg  2/2 bath  TDC  Heparin none - Mircera 120 mcg IV q 2 weeks (last dose 01/16/2023) - Venofer 50 mg IV q week   Assessment/Plan: Seizures: Had not been taking OP meds. Found down at  [employer]. Improved now. Acute CVA per MRI done 8/10 w/ cerebellar CVA w/o hemorrhage. Neuro switched Eliquis to warfarin. FYI - dialysis clinic will NOT manage his INR, needs PCP to do this. Acute hypoxic respiratory failure: Pulm edema + possible aspiration PNA on admit, better now. BP/volume: BP low sided (off amlod, now on mido 5mg  TID), well below prior EDW - will lower on d/c. ESRD: Continue HD on usual MWF schedule - HD tomorrow - 8/28. Anemia: Hgb down to 7's earlier this admit - now > 12. No ESA needed. Secondary HPTH: Ca ok, Phos higher - continue sevelamer  + cinacalcet.  Looks like missing binder at times. Nutrition: Alb 3.8, on renal/high protein diet.  Ozzie Hoyle, PA-C 02/12/2023, 9:52 AM  [facility]   Seen and examined independently.  Agree with note and exam as documented above by physician extender and as noted here.  General adult male in bed in no acute distress HEENT normocephalic atraumatic extraocular movements intact sclera anicteric Neck supple trachea midline Lungs clear to auscultation bilaterally normal work of breathing at rest on room air Heart regular  rate and rhythm no rubs or gallops appreciated Abdomen soft nontender nondistended Extremities no edema  Psych normal mood and affect Access RIJ tunn catheter   Seizures - per charting concern for noncompliance with medications  ESRD on HD - MWF schedule   Hypotension - on midodrine; improved most recently to 113/72.  He declined IV for minibolus this AM. To clarify states wasn't on amlodipine prior to admission   Awaiting placement - SNF per charting   Estanislado Emms, MD 02/12/2023 12:21 PM

## 2023-02-12 NOTE — Plan of Care (Signed)
 Problem: Education: Goal: Ability to describe self-care measures that may prevent or decrease complications (Diabetes Survival Skills Education) will improve Outcome: Progressing Goal: Individualized Educational Video(s) Outcome: Progressing   Problem: Coping: Goal: Ability to adjust to condition or change in health will improve Outcome: Progressing   Problem: Fluid Volume: Goal: Ability to maintain a balanced intake and output will improve Outcome: Progressing   Problem: Health Behavior/Discharge Planning: Goal: Ability to identify and utilize available resources and services will improve Outcome: Progressing Goal: Ability to manage health-related needs will improve Outcome: Progressing   Problem: Metabolic: Goal: Ability to maintain appropriate glucose levels will improve Outcome: Progressing   Problem: Nutritional: Goal: Maintenance of adequate nutrition will improve Outcome: Progressing Goal: Progress toward achieving an optimal weight will improve Outcome: Progressing   Problem: Skin Integrity: Goal: Risk for impaired skin integrity will decrease Outcome: Progressing   Problem: Tissue Perfusion: Goal: Adequacy of tissue perfusion will improve Outcome: Progressing   Problem: Education: Goal: Knowledge of General Education information will improve Description: Including pain rating scale, medication(s)/side effects and non-pharmacologic comfort measures Outcome: Progressing   Problem: Health Behavior/Discharge Planning: Goal: Ability to manage health-related needs will improve Outcome: Progressing   Problem: Clinical Measurements: Goal: Ability to maintain clinical measurements within normal limits will improve Outcome: Progressing Goal: Will remain free from infection Outcome: Progressing Goal: Diagnostic test results will improve Outcome: Progressing Goal: Respiratory complications will improve Outcome: Progressing Goal: Cardiovascular complication will  be avoided Outcome: Progressing   Problem: Activity: Goal: Risk for activity intolerance will decrease Outcome: Progressing   Problem: Nutrition: Goal: Adequate nutrition will be maintained Outcome: Progressing   Problem: Coping: Goal: Level of anxiety will decrease Outcome: Progressing   Problem: Elimination: Goal: Will not experience complications related to bowel motility Outcome: Progressing Goal: Will not experience complications related to urinary retention Outcome: Progressing   Problem: Pain Managment: Goal: General experience of comfort will improve Outcome: Progressing   Problem: Safety: Goal: Ability to remain free from injury will improve Outcome: Progressing   Problem: Skin Integrity: Goal: Risk for impaired skin integrity will decrease Outcome: Progressing   Problem: Education: Goal: Knowledge of disease or condition will improve Outcome: Progressing Goal: Knowledge of secondary prevention will improve (MUST DOCUMENT ALL) Outcome: Progressing Goal: Knowledge of patient specific risk factors will improve Loraine Leriche N/A or DELETE if not current risk factor) Outcome: Progressing   Problem: Ischemic Stroke/TIA Tissue Perfusion: Goal: Complications of ischemic stroke/TIA will be minimized Outcome: Progressing   Problem: Coping: Goal: Will verbalize positive feelings about self Outcome: Progressing Goal: Will identify appropriate support needs Outcome: Progressing   Problem: Health Behavior/Discharge Planning: Goal: Ability to manage health-related needs will improve Outcome: Progressing Goal: Goals will be collaboratively established with patient/family Outcome: Progressing   Problem: Self-Care: Goal: Ability to participate in self-care as condition permits will improve Outcome: Progressing Goal: Verbalization of feelings and concerns over difficulty with self-care will improve Outcome: Progressing Goal: Ability to communicate needs accurately will  improve Outcome: Progressing   Problem: Nutrition: Goal: Risk of aspiration will decrease Outcome: Progressing Goal: Dietary intake will improve Outcome: Progressing   Problem: Education: Goal: Knowledge of disease or condition will improve Outcome: Progressing Goal: Knowledge of secondary prevention will improve (MUST DOCUMENT ALL) Outcome: Progressing Goal: Knowledge of patient specific risk factors will improve Loraine Leriche N/A or DELETE if not current risk factor) Outcome: Progressing   Problem: Ischemic Stroke/TIA Tissue Perfusion: Goal: Complications of ischemic stroke/TIA will be minimized Outcome: Progressing   Problem: Coping: Goal: Will  verbalize positive feelings about self Outcome: Progressing Goal: Will identify appropriate support needs Outcome: Progressing   Problem: Health Behavior/Discharge Planning: Goal: Ability to manage health-related needs will improve Outcome: Progressing Goal: Goals will be collaboratively established with patient/family Outcome: Progressing

## 2023-02-12 NOTE — Plan of Care (Signed)
  Problem: Education: Goal: Ability to describe self-care measures that may prevent or decrease complications (Diabetes Survival Skills Education) will improve Outcome: Progressing Goal: Individualized Educational Video(s) Outcome: Progressing   Problem: Coping: Goal: Ability to adjust to condition or change in health will improve Outcome: Progressing   Problem: Fluid Volume: Goal: Ability to maintain a balanced intake and output will improve Outcome: Progressing   Problem: Health Behavior/Discharge Planning: Goal: Ability to identify and utilize available resources and services will improve Outcome: Progressing Goal: Ability to manage health-related needs will improve Outcome: Progressing   Problem: Metabolic: Goal: Ability to maintain appropriate glucose levels will improve Outcome: Progressing   Problem: Nutritional: Goal: Maintenance of adequate nutrition will improve Outcome: Progressing Goal: Progress toward achieving an optimal weight will improve Outcome: Progressing   Problem: Skin Integrity: Goal: Risk for impaired skin integrity will decrease Outcome: Progressing   Problem: Tissue Perfusion: Goal: Adequacy of tissue perfusion will improve Outcome: Progressing   Problem: Education: Goal: Knowledge of General Education information will improve Description: Including pain rating scale, medication(s)/side effects and non-pharmacologic comfort measures Outcome: Progressing   Problem: Health Behavior/Discharge Planning: Goal: Ability to manage health-related needs will improve Outcome: Progressing   Problem: Clinical Measurements: Goal: Ability to maintain clinical measurements within normal limits will improve Outcome: Progressing Goal: Will remain free from infection Outcome: Progressing Goal: Diagnostic test results will improve Outcome: Progressing Goal: Respiratory complications will improve Outcome: Progressing Goal: Cardiovascular complication will  be avoided Outcome: Progressing   Problem: Activity: Goal: Risk for activity intolerance will decrease Outcome: Progressing   Problem: Nutrition: Goal: Adequate nutrition will be maintained Outcome: Progressing   Problem: Coping: Goal: Level of anxiety will decrease Outcome: Progressing   Problem: Elimination: Goal: Will not experience complications related to bowel motility Outcome: Progressing Goal: Will not experience complications related to urinary retention Outcome: Progressing   Problem: Pain Managment: Goal: General experience of comfort will improve Outcome: Progressing   Problem: Safety: Goal: Ability to remain free from injury will improve Outcome: Progressing   Problem: Skin Integrity: Goal: Risk for impaired skin integrity will decrease Outcome: Progressing   Problem: Education: Goal: Knowledge of disease or condition will improve Outcome: Progressing Goal: Knowledge of secondary prevention will improve (MUST DOCUMENT ALL) Outcome: Progressing Goal: Knowledge of patient specific risk factors will improve (Mark N/A or DELETE if not current risk factor) Outcome: Progressing   Problem: Ischemic Stroke/TIA Tissue Perfusion: Goal: Complications of ischemic stroke/TIA will be minimized Outcome: Progressing   Problem: Coping: Goal: Will verbalize positive feelings about self Outcome: Progressing Goal: Will identify appropriate support needs Outcome: Progressing   Problem: Health Behavior/Discharge Planning: Goal: Ability to manage health-related needs will improve Outcome: Progressing Goal: Goals will be collaboratively established with patient/family Outcome: Progressing   Problem: Self-Care: Goal: Ability to participate in self-care as condition permits will improve Outcome: Progressing Goal: Verbalization of feelings and concerns over difficulty with self-care will improve Outcome: Progressing Goal: Ability to communicate needs accurately will  improve Outcome: Progressing   Problem: Nutrition: Goal: Risk of aspiration will decrease Outcome: Progressing Goal: Dietary intake will improve Outcome: Progressing   
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
## 2023-02-13 ENCOUNTER — Other Ambulatory Visit (HOSPITAL_COMMUNITY): Payer: Self-pay

## 2023-02-13 DIAGNOSIS — R569 Unspecified convulsions: Secondary | ICD-10-CM | POA: Diagnosis not present

## 2023-02-13 LAB — RENAL FUNCTION PANEL
Albumin: 3.8 g/dL (ref 3.5–5.0)
Anion gap: 20 — ABNORMAL HIGH (ref 5–15)
BUN: 91 mg/dL — ABNORMAL HIGH (ref 6–20)
CO2: 23 mmol/L (ref 22–32)
Calcium: 8.6 mg/dL — ABNORMAL LOW (ref 8.9–10.3)
Chloride: 95 mmol/L — ABNORMAL LOW (ref 98–111)
Creatinine, Ser: 16.37 mg/dL — ABNORMAL HIGH (ref 0.61–1.24)
GFR, Estimated: 3 mL/min — ABNORMAL LOW (ref >60)
Glucose, Bld: 103 mg/dL — ABNORMAL HIGH (ref 70–99)
Phosphorus: 9.6 mg/dL — ABNORMAL HIGH (ref 2.5–4.6)
Potassium: 4.5 mmol/L (ref 3.5–5.1)
Sodium: 138 mmol/L (ref 135–145)

## 2023-02-13 LAB — CBC
HCT: 39.3 % (ref 39.0–52.0)
Hemoglobin: 12.8 g/dL — ABNORMAL LOW (ref 13.0–17.0)
MCH: 31.1 pg (ref 26.0–34.0)
MCHC: 32.6 g/dL (ref 30.0–36.0)
MCV: 95.4 fL (ref 80.0–100.0)
Platelets: 146 10*3/uL — ABNORMAL LOW (ref 150–400)
RBC: 4.12 MIL/uL — ABNORMAL LOW (ref 4.22–5.81)
RDW: 14.3 % (ref 11.5–15.5)
WBC: 5.7 10*3/uL (ref 4.0–10.5)
nRBC: 0 % (ref 0.0–0.2)

## 2023-02-13 LAB — PROTIME-INR
INR: 1.7 — ABNORMAL HIGH (ref 0.8–1.2)
Prothrombin Time: 19.8 s — ABNORMAL HIGH (ref 11.4–15.2)

## 2023-02-13 MED ORDER — ANTICOAGULANT SODIUM CITRATE 4% (200MG/5ML) IV SOLN: 5.0000 mL | Status: DC | PRN

## 2023-02-13 MED ORDER — RENA-VITE PO TABS
1.0000 | ORAL_TABLET | Freq: Every day | ORAL | 0 refills | Status: DC
  Filled 2023-02-13: qty 30, 30d supply, fill #0

## 2023-02-13 MED ORDER — CINACALCET HCL 30 MG PO TABS
60.0000 mg | ORAL_TABLET | Freq: Every day | ORAL | 0 refills | Status: DC
Start: 2023-02-13 — End: 2023-04-20
  Filled 2023-02-13: qty 39, 19d supply, fill #0

## 2023-02-13 MED ORDER — WARFARIN SODIUM 7.5 MG PO TABS
7.5000 mg | ORAL_TABLET | Freq: Once | ORAL | Status: AC
  Administered 2023-02-13: 7.5 mg via ORAL
  Filled 2023-02-13: qty 1

## 2023-02-13 MED ORDER — ACETAMINOPHEN 325 MG PO TABS: 650.0000 mg | ORAL_TABLET | Freq: Four times a day (QID) | ORAL | Status: AC | PRN

## 2023-02-13 MED ORDER — WARFARIN SODIUM 7.5 MG PO TABS
ORAL_TABLET | ORAL | 2 refills | Status: DC
  Filled 2023-02-13: qty 30, 30d supply, fill #0

## 2023-02-13 MED ORDER — LEVETIRACETAM 500 MG PO TABS
500.0000 mg | ORAL_TABLET | Freq: Two times a day (BID) | ORAL | 0 refills | Status: DC
  Filled 2023-02-13: qty 180, 90d supply, fill #0

## 2023-02-13 MED ORDER — DOCUSATE SODIUM 100 MG PO CAPS: 100.0000 mg | ORAL_CAPSULE | Freq: Two times a day (BID) | ORAL | Status: DC | PRN

## 2023-02-13 MED ORDER — MIDODRINE HCL 5 MG PO TABS
5.0000 mg | ORAL_TABLET | Freq: Three times a day (TID) | ORAL | 0 refills | Status: DC
  Filled 2023-02-13: qty 270, 90d supply, fill #0

## 2023-02-13 MED ORDER — PENTAFLUOROPROP-TETRAFLUOROETH EX AERO: 1.0000 | INHALATION_SPRAY | CUTANEOUS | Status: DC | PRN

## 2023-02-13 MED ORDER — ALTEPLASE 2 MG IJ SOLR: 2.0000 mg | Freq: Once | INTRAMUSCULAR | Status: DC | PRN

## 2023-02-13 MED ORDER — HEPARIN SODIUM (PORCINE) 1000 UNIT/ML DIALYSIS
1000.0000 [IU] | INTRAMUSCULAR | Status: DC | PRN
  Administered 2023-02-13: 1000 [IU]
  Filled 2023-02-13 (×2): qty 1

## 2023-02-13 MED ORDER — LIDOCAINE HCL (PF) 1 % IJ SOLN: 5.0000 mL | INTRAMUSCULAR | Status: DC | PRN

## 2023-02-13 MED ORDER — LIDOCAINE-PRILOCAINE 2.5-2.5 % EX CREA: 1.0000 | TOPICAL_CREAM | CUTANEOUS | Status: DC | PRN

## 2023-02-13 MED ORDER — ATORVASTATIN CALCIUM 40 MG PO TABS
40.0000 mg | ORAL_TABLET | Freq: Every day | ORAL | 0 refills | Status: DC
  Filled 2023-02-13: qty 90, 90d supply, fill #0

## 2023-02-13 MED ORDER — SEVELAMER CARBONATE 800 MG PO TABS
3200.0000 mg | ORAL_TABLET | Freq: Three times a day (TID) | ORAL | 0 refills | Status: DC
Start: 2023-02-13 — End: 2023-04-18
  Filled 2023-02-13: qty 179, 15d supply, fill #0

## 2023-02-13 MED ORDER — SEVELAMER CARBONATE 800 MG PO TABS
3200.0000 mg | ORAL_TABLET | Freq: Three times a day (TID) | ORAL | Status: DC
  Administered 2023-02-13 (×2): 3200 mg via ORAL
  Filled 2023-02-13 (×2): qty 4

## 2023-02-13 NOTE — TOC Progression Note (Signed)
Transition of Care [facility]) - Progression Note    Patient Details  Name: [name] [name] MRN: [id number] Date of Birth: 1980-09-11  Transition [facility]) CM/SW Contact  Ryer Asato A [place], [place] Phone Number: 02/13/2023, 4:22 PM  Clinical Narrative:     CSW met with pt at bedside to discuss disposition plan. He said that he was ok to go home and that he declined appeal process as no one was able to assist with it.   He said that he now receives a disability check and CSW was able to confirm he has sufficient funds in his bank account. He said he stays at [facility] in [place]. He stated that he takes the bus to his HD appointments, which are close by, and said he has friends to assist with other needs/transportation as needed. Outpatient PT set up.  Primary care appointment scheduled.   Pt needs assistance with transportation at discharge. Taxi voucher provided due to pt have DME.   TOC will continue to follow.   Expected Discharge Plan: OP Rehab Barriers to Discharge: No Barriers Identified  Expected Discharge Plan and Services   Discharge Planning Services: CM Consult Post Acute Care Choice: Durable Medical Equipment, Resumption of Svcs/PTA Provider Living arrangements for the past 2 months: Hotel/Motel Expected Discharge Date: 02/13/23               DME Arranged: Dan Humphreys rolling DME Agency: [employer] Date DME Agency Contacted: 01/25/23 Time DME Agency Contacted: 1539 Representative spoke with at DME Agency: Vaughan Basta, CM with [employer]             Social Determinants of Health (SDOH) Interventions SDOH Screenings   Food Insecurity: No Food Insecurity (02/03/2023)  Housing: Low Risk  (02/03/2023)  Transportation Needs: No Transportation Needs (02/03/2023)  Utilities: Not At Risk (02/03/2023)    Readmission Risk Interventions    01/25/2023    3:40 PM  Readmission Risk Prevention Plan  Post Dischage Appt Complete  Medication Screening Complete  Transportation  Screening Complete

## 2023-02-13 NOTE — Discharge Planning (Signed)
[facility] Patient Discharge Orders - [facility] CLINIC: [facility]  Patient's name: [name] [name] Admit/DC Dates: 01/18/2023 - 02/13/2023  DISCHARGE DIAGNOSES: Seizure - in setting of missed medications, no recurrence since getting meds consistently Acute cerebellar CVA ESRD Pulm edema - EDW reduced HCAP - finished course of abx  HD ORDER CHANGES: Heparin change: no EDW Change: yes New EDW: 99.5kg Bath Change: no  ANEMIA MANAGEMENT: Aranesp: Given: yes  Amount/Date of last dose: Aranesp on 01/21/23 ESA dose for discharge: none, Hgb > 12 IV Iron dose at discharge: none, then per protocol Transfusion: Given: no  BONE/MINERAL MEDICATIONS: Hectorol/Calcitriol change: no Sensipar/Parsabiv change: no  ACCESS INTERVENTION/CHANGE: no Details:  RECENT LABS: Recent Labs  Lab 02/13/23 0437 02/13/23 0804  HGB  --  12.8*  NA 138  --   K 4.5  --   CALCIUM 8.6*  --   PHOS 9.6*  --   ALBUMIN 3.8  --     IV ANTIBIOTICS: no Details:  OTHER ANTICOAGULATION: On Coumadin?: YES. Neuro changed Eliquis to warfarin this admit - will be managed by PCP per notes - [facility]. Last INR: 1.7 on 8/28  OTHER/APPTS/LAB ORDERS:  - Original plan was for SNF or rehab - insurance authorization issues - not happening now  - Pls check INR weekly for 1 mo, then ask Dr. Marisue Humble or PA for further plan - make sure to goal and that he is seeing PCP for management. Concerned that he won't follow this, but was very non-compliant with Eliquis too, and cost was a problem  - Apparently will be living in [address] and riding bus to dialysis - pls have dialysis SW check to make sure this is working for him  D/C Meds to be reconciled by nurse after every discharge.  Completed By: Ozzie Hoyle, PA-C  [facility] Pager [phone]   Reviewed by: MD:______ RN_______

## 2023-02-13 NOTE — Progress Notes (Signed)
02/13/2023  [name] [name] DOB: [birth year] MRN: [id number]   RIDER WAIVER AND RELEASE OF LIABILITY  For the purposes of helping with transportation needs, [facility] partners with outside transportation providers (taxi companies, [place], Catering manager.) to give [employer] patients or other approved people the choice of on-demand rides [employer]") to our buildings for non-emergency visits.  By using [employer], I, the person signing this document, on behalf of myself and/or any legal minors (in my care using the [employer]), agree:  Science writer given to me are supplied by independent, outside transportation providers who do not work for, or have any affiliation with, [employer]. [place] is not a transportation company. [facility] has no control over the quality or safety of the rides I get using [employer]. [facility] has no control over whether any outside ride will happen on time or not. [facility] gives no guarantee on the reliability, quality, safety, or availability on any rides, or that no mistakes will happen. I know and accept that traveling by vehicle (car, truck, SVU, Zenaida Niece, bus, taxi, etc.) has risks of serious injuries such as disability, being paralyzed, and death. I know and agree the risk of using [employer] is mine alone, and not [employer]. Transport Services are provided "as is" and as are available. The transportation providers are in charge for all inspections and care of the vehicles used to provide these rides. I agree not to take legal action against [facility], its agents, employees, officers, directors, representatives, insurers, attorneys, assigns, successors, subsidiaries, and affiliates at any time for any reasons related directly or indirectly to using [employer]. I also agree not to take legal action against [facility] or its affiliates for any injury, death, or damage to property caused by or related to using  [employer]. I have read this Waiver and Release of Liability, and I understand the terms used in it and their legal meaning. This Waiver is freely and voluntarily given with the understanding that my right (or any legal minors) to legal action against [facility] relating to [employer] is knowingly given up to use these services.   I attest that I read the Ride Waiver and Release of Liability to [name] [name], gave [name] the opportunity to ask questions and answered the questions asked (if any). I affirm that [name] [name] then provided consent for assistance with transportation.
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
## 2023-02-13 NOTE — Progress Notes (Addendum)
[facility] Progress Note   Subjective:  Seen on HD - 2.5L UFG and tolerating. Denies CP/dyspnea. Dispo plan still pending per notes.  Objective Vitals:   02/13/23 0500 02/13/23 0547 02/13/23 0758 02/13/23 0805  BP:  121/80 119/78 119/86  Pulse:  (!) 59 (!) 57 (!) 54  Resp:  18 14 13   Temp:  98 F (36.7 C) (!) 97.3 F (36.3 C)   TempSrc:      SpO2:  95% 97% 97%  Weight: 100.9 kg  101.6 kg   Height:       Physical Exam General: Well appearing man, NAD. Room air Heart: RRR; no murmur Lungs: CTA anteriorly Abdomen: soft; non-tender Extremities: no LE edema Dialysis Access:  TDC in R chest  Additional Objective Labs: Basic Metabolic Panel: Recent Labs  Lab 02/11/23 0740 02/12/23 0842 02/13/23 0437  NA 134* 133* 138  K 5.0 4.6 4.5  CL 94* 96* 95*  CO2 19* 19* 23  GLUCOSE 85 89 103*  BUN 105* 71* 91*  CREATININE 17.75* 13.61* 16.37*  CALCIUM 8.4* 8.6* 8.6*  PHOS 10.8* 7.2* 9.6*   Liver Function Tests: Recent Labs  Lab 02/11/23 0740 02/12/23 0842 02/13/23 0437  ALBUMIN 3.8 3.9 3.8    CBC: Recent Labs  Lab 02/06/23 1006 02/08/23 0954 02/09/23 0411 02/11/23 0740 02/13/23 0804  WBC 6.2 5.9 6.3 6.6 5.7  HGB 12.4* 13.0 13.8 12.3* 12.8*  HCT 38.3* 40.3 42.1 37.8* 39.3  MCV 94.1 93.9 93.1 92.9 95.4  PLT 144* 163 176 167 146*   Medications:  sodium chloride 10 mL/hr at 01/23/23 1400   anticoagulant sodium citrate      atorvastatin  40 mg Oral Daily   Chlorhexidine Gluconate Cloth  6 each Topical Q0600   cinacalcet  60 mg Oral Q supper   levETIRAcetam  500 mg Oral BID   midodrine  5 mg Oral TID WC   multivitamin  1 tablet Oral QHS   sevelamer carbonate  2,400 mg Oral TID WC   warfarin  5 mg Oral q1600   Warfarin - Pharmacist Dosing Inpatient   Does not apply q1600    Dialysis Orders: [facility] MWF 4h   400/800 112.8kg  2/2 bath  TDC  Heparin none - Mircera 120 mcg IV q 2 weeks (last dose 01/16/2023) - Venofer 50 mg IV q week    Assessment/Plan: Seizures: Had not been taking OP meds. Found down at [employer]. Improved now. Acute CVA per MRI done 8/10 w/ cerebellar CVA w/o hemorrhage. Neuro switched Eliquis to warfarin. FYI - dialysis clinic will NOT manage his INR, needs PCP to do this. Acute hypoxic respiratory failure: Pulm edema + possible aspiration PNA on admit, now resolved. BP/volume: BP low sided (on mido 5mg  TID), well below prior EDW - will lower on d/c. ESRD: Continue HD on usual MWF schedule - HD now, 2.5L UFG. No heparin (HIT) Anemia: Hgb down to 7's earlier this admit - now > 12. No ESA needed. Secondary HPTH: Ca ok, Phos remains high - continue sevelamer  + cinacalcet. ^ Sevelamer dose to 4/meals. Nutrition: Alb 3.8, on renal/high protein diet.  Ozzie Hoyle, PA-C 02/13/2023, 8:39 AM   [facility]   Seen and examined independently this AM.  No complaints this AM.  Agree with note and exam as documented above by physician extender and as noted here.  Seen and examined on dialysis.  Procedure supervised.  Blood pressure 111/73 and HR 73.  Tolerating goal.  RIJ tunn catheter in use.  He has to urinate and doesn't want to use the urinal.  I have advised him to try urinal and stay on machine - decision ultimately up to him.    Estanislado Emms, MD 02/13/2023 10:25 AM
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
## 2023-02-13 NOTE — Progress Notes (Signed)
   02/13/23 1210  Vitals  Temp 97.9 F (36.6 C)  Pulse Rate 71  Resp 17  BP (S)  102/64 (BP improvement)  SpO2 100 %  O2 Device Room Air  Weight 99.1 kg  Type of Weight Post-Dialysis  Oxygen Therapy  Patient Activity (if Appropriate) In bed  Pulse Oximetry Type Continuous  Post Treatment  Dialyzer Clearance Lightly streaked  Hemodialysis Intake (mL) 0 mL  Liters Processed 80.3  Fluid Removed (mL) 1900 mL  Tolerated HD Treatment Yes  During Treatment Monitoring  Duration of HD Treatment -hour(s) 3.75 hour(s)   Received patient in bed to unit.  Alert and oriented.  Informed consent signed and in chart.   TX duration:3hr 45 min  Patient tolerated well.  Transported back to the room  Alert, without acute distress.  Hand-off given to patient's nurse.   Access used: [facility] Access issues: none  Total UF removed: 1.9L Turned uf off due to patient cramping. Medication(s) given: none    [facility]

## 2023-02-13 NOTE — TOC Progression Note (Signed)
Transition of Care [facility]) - Progression Note    Patient Details  Name: [name] [name] MRN: [id number] Date of Birth: [birth year]  Transition of [facility]) CM/SW Contact  Janae Bridgeman, RN Phone Number: 02/13/2023, 4:06 PM  Clinical Narrative:    CM spoke with Dr. Sharon Seller and patient is able to discharge to hotel today.  The patient was schedule for a [facility] for [facility] 22, 2023 at 2:20 pm and was also placed on the waiting list for a sooner appointment if possible since the patient will need labs drawn in regards to his Coumadin.  Discharge medications will be provided through [facility] pharmacy.  Message was sent to [facility], Renal navigator to updated regarding patient's discharge from the hospital today.  MSW is assisting with transportation to hotel.  Patient has rolator is his hospital room.  Bedside nursing will provide discharge instructions and copy of the AVS.   Expected Discharge Plan: OP Rehab Barriers to Discharge: No Barriers Identified  Expected Discharge Plan and Services   Discharge Planning Services: CM Consult Post Acute Care Choice: Durable Medical Equipment, Resumption of Svcs/PTA Provider Living arrangements for the past 2 months: Hotel/Motel Expected Discharge Date: 02/13/23               DME Arranged: Dan Humphreys rolling DME Agency: [employer] Date DME Agency Contacted: 01/25/23 Time DME Agency Contacted: 1539 Representative spoke with at DME Agency: Vaughan Basta, CM with [employer]             Social Determinants of Health (SDOH) Interventions SDOH Screenings   Food Insecurity: No Food Insecurity (02/03/2023)  Housing: Low Risk  (02/03/2023)  Transportation Needs: No Transportation Needs (02/03/2023)  Utilities: Not At Risk (02/03/2023)    Readmission Risk Interventions    01/25/2023    3:40 PM  Readmission Risk Prevention Plan  Post Dischage Appt Complete  Medication Screening Complete   Transportation Screening Complete

## 2023-02-13 NOTE — Plan of Care (Signed)

## 2023-02-13 NOTE — Progress Notes (Signed)
ANTICOAGULATION CONSULT NOTE  Pharmacy Consult for Warfarin  Indication:  antiphospholipid syndrome  Allergies  Allergen Reactions   Cetirizine & Related Swelling    Patient Measurements: Height: 6\' 1"  (185.4 cm) Weight: 99.1 kg (218 lb 7.6 oz) IBW/kg (Calculated) : 79.9 Enoxparin Dosing Weight: 102 kg  Vital Signs: Temp: 97.9 F (36.6 C) (08/28 1210) BP: 102/64 (08/28 1210) Pulse Rate: 71 (08/28 1210)  Labs: Recent Labs    02/11/23 0740 02/12/23 0842 02/13/23 0437 02/13/23 0804  HGB 12.3*  --   --  12.8*  HCT 37.8*  --   --  39.3  PLT 167  --   --  146*  LABPROT 37.3* 22.8* 19.8*  --   INR 3.8* 2.0* 1.7*  --   CREATININE 17.75* 13.61* 16.37*  --     Assessment: 41yoM with hx of lupus anticoagulant/antiphospholipid syndrome and was supposed to be on apixaban prior to admission, but last picked up Dec 2023 for 90 day supply.   Decision made to change long-term anticoagulation to warfarin on 8/13. Completed bridge therapy with Lovenox. Not currently on any interacting medications. CBC stable. I  INR today down to 1.7 today  Goal of Therapy:  INR 2-3 Monitor platelets by anticoagulation protocol: Yes   Plan:  Warfarin 7.5 mg po x 1 dose at 1800 pm Change INR checks to daily until stable (if continues to downtrend, restart Lovenox 100 mg Q 24 hours)  Continue to monitor H&H and platelets  Okey Regal, PharmD 02/13/2023 12:55 PM

## 2023-02-14 ENCOUNTER — Telehealth (HOSPITAL_COMMUNITY): Payer: Self-pay | Admitting: Nephrology

## 2023-02-14 ENCOUNTER — Telehealth: Payer: Self-pay

## 2023-02-14 ENCOUNTER — Encounter (HOSPITAL_COMMUNITY): Payer: Self-pay

## 2023-02-14 ENCOUNTER — Encounter: Payer: Self-pay | Admitting: Pulmonary Disease

## 2023-02-14 NOTE — Progress Notes (Signed)
Late Note Entry- February 14, 2023  Contacted yesterday afternoon in regards to pt's d/c. Contacted [facility] this morning to advise clinic of pt's d/c date and that pt should resume care tomorrow. Clinic was advised of pt's d/c plan from hospital.   Olivia Canter Renal Navigator [phone]

## 2023-02-14 NOTE — Telephone Encounter (Signed)
Transition of care contact from inpatient facility  Date of Discharge: 02/13/23 Date of Contact: 02/14/23 - attempt #1 Method of contact: Phone  Attempted to contact patient to discuss transition of care from inpatient admission. Patient did not answer the phone. There was no option to leave a message. Will try to catch him at dialysis this week.  Ozzie Hoyle, PA-C [employer] Pager [phone]

## 2023-02-14 NOTE — Transitions of Care (Post Inpatient/ED Visit) (Signed)
   02/14/2023  Name: [name] [name] MRN: [id number] DOB: 31-May-1981  Today's TOC FU Call Status: Today's TOC FU Call Status:: Unsuccessful Call (1st Attempt) Unsuccessful Call (1st Attempt) Date: 02/14/23  Attempted to reach the patient regarding the most recent Inpatient/ED visit.  Follow Up Plan: Additional outreach attempts will be made to reach the patient to complete the Transitions of Care (Post Inpatient/ED visit) call.   [facility] Edwin Dada, NP on 02/22/2023.   I spoke to Butch Penny, [facility] regarding the patient being discharged on warfarin and his appointment at [facility] is not until 02/22/2023.   Franky Macho reviewed the medical record and stated that the 02/22/2023 appointment is adequate as the patient has been on warfarin for about 2 weeks prior to discharge from the hospital and his INR has been monitored.   Signature Robyne Peers, RN

## 2023-02-15 ENCOUNTER — Encounter (HOSPITAL_COMMUNITY): Payer: Self-pay

## 2023-02-17 ENCOUNTER — Encounter (HOSPITAL_COMMUNITY): Payer: Self-pay

## 2023-02-19 ENCOUNTER — Telehealth: Payer: Self-pay | Admitting: Physical Therapy

## 2023-02-19 ENCOUNTER — Telehealth: Payer: Self-pay

## 2023-02-19 ENCOUNTER — Ambulatory Visit: Payer: 59 | Attending: Physical Therapy | Admitting: Physical Therapy

## 2023-02-19 NOTE — Telephone Encounter (Signed)
Called patient and LVM. First of third of no shows. Patient informed of no show/cancellation policy and informed how to get back on schedule.   Maryruth Eve, PT, DPT'

## 2023-02-19 NOTE — Transitions of Care (Post Inpatient/ED Visit) (Signed)
   02/19/2023  Name: [name] [name] MRN: [id number] DOB: 1980/09/23  Today's TOC FU Call Status: Today's TOC FU Call Status:: Unsuccessful Call (2nd Attempt) Unsuccessful Call (1st Attempt) Date: 02/13/23 Unsuccessful Call (2nd Attempt) Date: 02/19/23  Attempted to reach the patient regarding the most recent Inpatient/ED visit.  Follow Up Plan: Additional outreach attempts will be made to reach the patient to complete the Transitions of Care (Post Inpatient/ED visit) call.   The patient has an appointment with Arbutus Ped, NP at [facility] on 02/22/2023.   Signature Robyne Peers, RN

## 2023-02-20 ENCOUNTER — Telehealth: Payer: Self-pay

## 2023-02-20 ENCOUNTER — Encounter: Payer: Self-pay | Admitting: Neurology

## 2023-02-20 NOTE — Transitions of Care (Post Inpatient/ED Visit) (Signed)
   02/20/2023  Name: [name] [name] MRN: [id number] DOB: [birth year]  Today's TOC FU Call Status: Today's TOC FU Call Status:: Unsuccessful Call (3rd Attempt) Unsuccessful Call (1st Attempt) Date: 02/14/23 Unsuccessful Call (2nd Attempt) Date: 02/19/23 Unsuccessful Call (3rd Attempt) Date: 02/20/23  Attempted to reach the patient regarding the most recent Inpatient/ED visit.  Follow Up Plan: No further outreach attempts will be made at this time. We have been unable to contact the patient.  The patient has an appointment with Alvina Filbert, NP at [facility] on 02/22/2023.   Signature Robyne Peers, RN

## 2023-02-22 ENCOUNTER — Inpatient Hospital Stay: Payer: Self-pay | Admitting: Nurse Practitioner

## 2023-02-25 ENCOUNTER — Encounter (HOSPITAL_COMMUNITY): Payer: Self-pay

## 2023-03-18 ENCOUNTER — Encounter (HOSPITAL_COMMUNITY): Payer: Self-pay

## 2023-03-26 ENCOUNTER — Ambulatory Visit: Payer: 59 | Attending: Nurse Practitioner

## 2023-03-26 VITALS — Ht 73.0 in | Wt 218.0 lb

## 2023-03-26 DIAGNOSIS — Z0000 Encounter for general adult medical examination without abnormal findings: Secondary | ICD-10-CM | POA: Diagnosis not present

## 2023-03-26 NOTE — Progress Notes (Signed)
Subjective:   [name] [name] is a 42 y.o. male who presents for an Initial Medicare Annual Wellness Visit.  Visit Complete: Virtual I connected with  [name] [name] on 03/26/23 by a audio enabled telemedicine application and verified that I am speaking with the correct person using two identifiers.  Patient Location: Home  Provider Location: Home Office  I discussed the limitations of evaluation and management by telemedicine. The patient expressed understanding and agreed to proceed.  Vital Signs: Because this visit was a virtual/telehealth visit, some criteria may be missing or patient reported. Any vitals not documented were not able to be obtained and vitals that have been documented are patient reported.  Cardiac Risk Factors include: male gender;hypertension     Objective:    Today's Vitals   03/26/23 1434  Weight: 218 lb (98.9 kg)  Height: 6\' 1"  (1.854 m)   Body mass index is 28.76 kg/m.     03/26/2023    2:38 PM 01/18/2023    8:35 PM 01/02/2023    7:31 PM 12/28/2022    9:17 PM 12/28/2022    1:49 PM 11/08/2022   10:34 AM 08/10/2022   11:10 AM  Advanced Directives  Does Patient Have a Medical Advance Directive? No  No No No No No  Would patient like information on creating a medical advance directive? Yes (MAU/Ambulatory/Procedural Areas - Information given)   No - Patient declined        Information is confidential and restricted. Go to Review Flowsheets to unlock data.     Current Medications (verified) Outpatient Encounter Medications as of 03/26/2023  Medication Sig   acetaminophen (TYLENOL) 325 MG tablet Take 2 tablets (650 mg total) by mouth every 6 (six) hours as needed for mild pain.   amLODipine (NORVASC) 10 MG tablet Take 1 tablet (10 mg total) by mouth at bedtime. For blood pressure   atorvastatin (LIPITOR) 40 MG tablet Take 1 tablet (40 mg total) by mouth daily.   carvedilol (COREG) 12.5 MG tablet Take 1 tablet (12.5 mg total) by mouth 2 (two) times daily  with a meal. DOSE CHANGE   cinacalcet (SENSIPAR) 30 MG tablet Take 2 tablets (60 mg total) by mouth daily with supper.   cinacalcet (SENSIPAR) 90 MG tablet Take 90 mg by mouth daily.   docusate sodium (COLACE) 100 MG capsule Take 1 capsule (100 mg total) by mouth 2 (two) times daily as needed for mild constipation.   ELIQUIS 2.5 MG TABS tablet TAKE 1 TABLET(2.5 MG) BY MOUTH TWICE DAILY   levETIRAcetam (KEPPRA) 500 MG tablet Take 1 tablet (500 mg total) by mouth 2 (two) times daily.   midodrine (PROAMATINE) 5 MG tablet Take 1 tablet (5 mg total) by mouth 3 (three) times daily with meals.   multivitamin (RENA-VIT) TABS tablet Take 1 tablet by mouth at bedtime.   rosuvastatin (CRESTOR) 40 MG tablet Take 40 mg by mouth daily.   sevelamer carbonate (RENVELA) 800 MG tablet Take 4 tablets (3,200 mg total) by mouth 3 (three) times daily with meals.   warfarin (COUMADIN) 7.5 MG tablet Take 7.5mg  daily at 6pm   levETIRAcetam (KEPPRA) 1000 MG tablet Take 1 tablet (1,000 mg total) by mouth daily.   No facility-administered encounter medications on file as of 03/26/2023.    Allergies (verified) Zyrtec [cetirizine] and Cetirizine & related   History: Past Medical History:  Diagnosis Date   Chronic kidney disease    ESRD (end stage renal disease) (HCC)    Hypertension  Lupus    Seizures (HCC)    Seizures (HCC)    Stroke (HCC)    2 or 42 years of age.     Wears glasses    Past Surgical History:  Procedure Laterality Date   AV FISTULA PLACEMENT Left 12/30/2017   Procedure: ARTERIOVENOUS (AV) FISTULA CREATION VERSUS INSERTION OF ARTERIOVENOUS GRAFT LEFT ARM;  Surgeon: Larina Earthly, MD;  Location: [facility] OR;  Service: Vascular;  Laterality: Left;   BASCILIC VEIN TRANSPOSITION Left 08/15/2018   Procedure: LEFT FIRST STAGE BASCILIC VEIN TRANSPOSITION;  Surgeon: Larina Earthly, MD;  Location: [facility] OR;  Service: Vascular;  Laterality: Left;   BASCILIC VEIN TRANSPOSITION Left 10/30/2018   Procedure:  INSERTION OF GORE-TEX GRAFT LEFT UPPER ARM;  Surgeon: Maeola Harman, MD;  Location: [facility] OR;  Service: Vascular;  Laterality: Left;   INSERTION OF DIALYSIS CATHETER N/A 09/20/2018   Procedure: INSERTION OF TUNNELED DIALYSIS CATHETER RIGHT INTERNAL JUGULAR;  Surgeon: Cephus Shelling, MD;  Location: [facility] OR;  Service: Vascular;  Laterality: N/A;   IR FLUORO GUIDE CV LINE RIGHT  07/16/2022   IR US GUIDE VASC ACCESS RIGHT  07/16/2022   RENAL BIOPSY     Family History  Problem Relation Age of Onset   Diabetes Mother    Diabetes Maternal Grandmother    Seizures Neg Hx        not on mother's side. he doesn't know about his father's side   Social History   Socioeconomic History   Marital status: Unknown    Spouse name: Not on file   Number of children: 0   Years of education: Not on file   Highest education level: High school graduate  Occupational History   Not on file  Tobacco Use   Smoking status: Unknown   Smokeless tobacco: Never  Vaping Use   Vaping status: Never Used  Substance and Sexual Activity   Alcohol use: Not Currently   Drug use: No   Sexual activity: Not Currently  Other Topics Concern   Not on file  Social History Narrative   ** Merged History Encounter **       ** Merged History Encounter **       Lives with a friend Right handed Caffeine: rare   Social Determinants of Health   Financial Resource Strain: Low Risk  (03/26/2023)   Overall Financial Resource Strain (CARDIA)    Difficulty of Paying Living Expenses: Not hard at all  Food Insecurity: No Food Insecurity (03/26/2023)   Hunger Vital Sign    Worried About Running Out of Food in the Last Year: Never true    Ran Out of Food in the Last Year: Never true  Transportation Needs: No Transportation Needs (03/26/2023)   PRAPARE - Administrator, Civil Service (Medical): No    Lack of Transportation (Non-Medical): No  Physical Activity: Inactive (03/26/2023)   Exercise Vital Sign     Days of Exercise per Week: 0 days    Minutes of Exercise per Session: 0 min  Stress: No Stress Concern Present (03/26/2023)   [employer] of Occupational Health - Occupational Stress Questionnaire    Feeling of Stress : Not at all  Social Connections: Moderately Isolated (03/26/2023)   Social Connection and Isolation Panel [NHANES]    Frequency of Communication with Friends and Family: More than three times a week    Frequency of Social Gatherings with Friends and Family: Three times a week    Attends Religious  Services: 1 to 4 times per year    Active Member of Clubs or Organizations: No    Attends Banker Meetings: Never    Marital Status: Never married    Tobacco Counseling Counseling given: Not Answered   Clinical Intake:  Pre-visit preparation completed: Yes  Pain : No/denies pain     Diabetes: No  How often do you need to have someone help you when you read instructions, pamphlets, or other written materials from your doctor or pharmacy?: 1 - Never  Interpreter Needed?: No  Information entered by :: Kandis Fantasia LPN   Activities of Daily Living    03/26/2023    2:35 PM 02/07/2023    8:35 PM  In your present state of health, do you have any difficulty performing the following activities:  Hearing? 0   Vision? 0   Difficulty concentrating or making decisions? 0   Walking or climbing stairs? 0   Dressing or bathing? 0   Doing errands, shopping? 0   Preparing Food and eating ? N   Using the Toilet? N   In the past six months, have you accidently leaked urine? N   Do you have problems with loss of bowel control? N   Managing your Medications? N   Managing your Finances? N   Housekeeping or managing your Housekeeping? N      Information is confidential and restricted. Go to Review Flowsheets to unlock data.     Patient Care Team: Claiborne Rigg, NP as PCP - General (Nurse Practitioner) Claiborne Rigg, NP (Nurse  Practitioner)  Indicate any recent Medical Services you may have received from other than [facility] providers in the past year (date may be approximate).     Assessment:   This is a routine wellness examination for [facility].  Hearing/Vision screen Hearing Screening - Comments:: Denies hearing difficulties   Vision Screening - Comments:: No vision problems; will schedule routine eye exam soon     Goals Addressed             This Visit's Progress    DIET - EAT MORE FRUITS AND VEGETABLES        Depression Screen    03/26/2023    2:37 PM 09/27/2020   11:11 AM 07/07/2019    2:38 PM 01/10/2018    2:56 PM 12/12/2017    3:57 PM 08/22/2017    3:46 PM 12/10/2016    5:31 PM  PHQ 2/9 Scores  PHQ - 2 Score 0 0 0  0 0 0  PHQ- 9 Score  1 1   0 0  Exception Documentation    Patient refusal       Fall Risk    03/26/2023    2:39 PM 08/21/2022    3:17 PM 09/27/2020   11:11 AM 12/12/2017    3:57 PM 01/12/2015   11:38 AM  Fall Risk   Falls in the past year? 0 0 0 No No  Number falls in past yr: 0 0 0    Injury with Fall? 0 0 0    Risk for fall due to : No Fall Risks No Fall Risks     Follow up Falls prevention discussed;Education provided;Falls evaluation completed Falls evaluation completed       MEDICARE RISK AT HOME: Medicare Risk at Home Any stairs in or around the home?: No If so, are there any without handrails?: No Home free of loose throw rugs in walkways, pet beds, electrical cords, etc?:  Yes Adequate lighting in your home to reduce risk of falls?: Yes Life alert?: No Use of a cane, walker or w/c?: No Grab bars in the bathroom?: Yes Shower chair or bench in shower?: No Elevated toilet seat or a handicapped toilet?: No  TIMED UP AND GO:  Was the test performed? No    Cognitive Function:        03/26/2023    2:39 PM  6CIT Screen  What Year? 0 points  What month? 0 points  What time? 0 points  Count back from 20 0 points  Months in reverse 0 points  Repeat phrase 0 points   Total Score 0 points    Immunizations Immunization History  Administered Date(s) Administered   Hepatitis B, ADULT 10/29/2018, 11/26/2018, 12/24/2018, 05/01/2019   Influenza, Quadrivalent, Recombinant, Inj, Pf 04/06/2022   Influenza,inj,Quad PF,6+ Mos 07/11/2017, 07/19/2017, 07/07/2019, 04/25/2020   PFIZER(Purple Top)SARS-COV-2 Vaccination 02/19/2020, 03/14/2020   Pneumococcal Conjugate-13 01/14/2019   Pneumococcal Polysaccharide-23 12/22/2017, 03/18/2019   Tdap 01/12/2015    TDAP status: Up to date  Flu Vaccine status: Due, Education has been provided regarding the importance of this vaccine. Advised may receive this vaccine at local pharmacy or Health Dept. Aware to provide a copy of the vaccination record if obtained from local pharmacy or Health Dept. Verbalized acceptance and understanding.  Pneumococcal vaccine status: Up to date  Covid-19 vaccine status: Information provided on how to obtain vaccines.   Qualifies for Shingles Vaccine? No    Screening Tests Health Maintenance  Topic Date Due   COVID-19 Vaccine (3 - Pfizer risk series) 04/11/2020   INFLUENZA VACCINE  01/17/2023   Medicare Annual Wellness (AWV)  03/25/2024   DTaP/Tdap/Td (2 - Td or Tdap) 01/11/2025   Hepatitis C Screening  Completed   HIV Screening  Completed   HPV VACCINES  Aged Out    Health Maintenance  Health Maintenance Due  Topic Date Due   COVID-19 Vaccine (3 - Pfizer risk series) 04/11/2020   INFLUENZA VACCINE  01/17/2023    Lung Cancer Screening: (Low Dose CT Chest recommended if Age 14-80 years, 20 pack-year currently smoking OR have quit w/in 15years.) does not qualify.   Lung Cancer Screening Referral: n/a  Additional Screening:  Hepatitis C Screening: does qualify; Completed 09/27/20  Vision Screening: Recommended annual ophthalmology exams for early detection of glaucoma and other disorders of the eye. Is the patient up to date with their annual eye exam?  No  Who is the  provider or what is the name of the office in which the patient attends annual eye exams? none If pt is not established with a provider, would they like to be referred to a provider to establish care? No .   Dental Screening: Recommended annual dental exams for proper oral hygiene  Community Resource Referral / Chronic Care Management: CRR required this visit?  No   CCM required this visit?  No    Plan:     I have personally reviewed and noted the following in the patient's chart:   Medical and social history Use of alcohol, tobacco or illicit drugs  Current medications and supplements including opioid prescriptions. Patient is not currently taking opioid prescriptions. Functional ability and status Nutritional status Physical activity Advanced directives List of other physicians Hospitalizations, surgeries, and ER visits in previous 12 months Vitals Screenings to include cognitive, depression, and falls Referrals and appointments  In addition, I have reviewed and discussed with patient certain preventive protocols, quality metrics, and best  practice recommendations. A written personalized care plan for preventive services as well as general preventive health recommendations were provided to patient.     Kandis Fantasia [place], [place]   16/06/958   After Visit Summary: (MyChart) Due to this being a telephonic visit, the after visit summary with patients personalized plan was offered to patient via MyChart   Nurse Notes: No concerns at this time

## 2023-03-26 NOTE — Patient Instructions (Signed)
Mr. Foot , Thank you for taking time to come for your Medicare Wellness Visit. I appreciate your ongoing commitment to your health goals. Please review the following plan we discussed and let me know if I can assist you in the future.   Referrals/Orders/Follow-Ups/Clinician Recommendations: Aim for 30 minutes of exercise or brisk walking, 6-8 glasses of water, and 5 servings of fruits and vegetables each day.  The address for Dr. Patrcia Dolly is:  [address]  [address] [place], [place] [address]  Phone Number:  [phone]  This is a list of the screening recommended for you and due dates:  Health Maintenance  Topic Date Due   COVID-19 Vaccine (3 - Pfizer risk series) 04/11/2020   Flu Shot  01/17/2023   Medicare Annual Wellness Visit  03/25/2024   DTaP/Tdap/Td vaccine (2 - Td or Tdap) 01/11/2025   Hepatitis C Screening  Completed   HIV Screening  Completed   HPV Vaccine  Aged Out    Advanced directives: (ACP Link)Information on Advanced Care Planning can be found at [facility] Advance Health Care Directives Advance Health Care Directives ([email or web address]   Next Medicare Annual Wellness Visit scheduled for next year: Yes

## 2023-03-27 ENCOUNTER — Emergency Department (HOSPITAL_COMMUNITY): Payer: 59

## 2023-03-27 ENCOUNTER — Inpatient Hospital Stay (HOSPITAL_COMMUNITY)
Admission: EM | Admit: 2023-03-27 | Discharge: 2023-04-20 | DRG: 100 | Disposition: A | Discharge status: Home Health | Payer: 59 | Source: Other Acute Inpatient Hospital | Attending: Internal Medicine | Admitting: Internal Medicine

## 2023-03-27 ENCOUNTER — Other Ambulatory Visit: Payer: Self-pay

## 2023-03-27 ENCOUNTER — Encounter (HOSPITAL_COMMUNITY): Payer: Self-pay

## 2023-03-27 DIAGNOSIS — D696 Thrombocytopenia, unspecified: Secondary | ICD-10-CM | POA: Diagnosis present

## 2023-03-27 DIAGNOSIS — R0902 Hypoxemia: Secondary | ICD-10-CM | POA: Diagnosis not present

## 2023-03-27 DIAGNOSIS — G4733 Obstructive sleep apnea (adult) (pediatric): Secondary | ICD-10-CM | POA: Diagnosis present

## 2023-03-27 DIAGNOSIS — N250 Renal osteodystrophy: Secondary | ICD-10-CM | POA: Diagnosis present

## 2023-03-27 DIAGNOSIS — I6340 Cerebral infarction due to embolism of unspecified cerebral artery: Secondary | ICD-10-CM

## 2023-03-27 DIAGNOSIS — M329 Systemic lupus erythematosus, unspecified: Secondary | ICD-10-CM | POA: Diagnosis present

## 2023-03-27 DIAGNOSIS — E8770 Fluid overload, unspecified: Secondary | ICD-10-CM | POA: Diagnosis not present

## 2023-03-27 DIAGNOSIS — Z558 Other problems related to education and literacy: Secondary | ICD-10-CM

## 2023-03-27 DIAGNOSIS — D6861 Antiphospholipid syndrome: Secondary | ICD-10-CM | POA: Diagnosis present

## 2023-03-27 DIAGNOSIS — I953 Hypotension of hemodialysis: Secondary | ICD-10-CM | POA: Diagnosis not present

## 2023-03-27 DIAGNOSIS — R4182 Altered mental status, unspecified: Secondary | ICD-10-CM

## 2023-03-27 DIAGNOSIS — N186 End stage renal disease: Secondary | ICD-10-CM | POA: Diagnosis present

## 2023-03-27 DIAGNOSIS — E872 Acidosis, unspecified: Secondary | ICD-10-CM | POA: Diagnosis present

## 2023-03-27 DIAGNOSIS — I63432 Cerebral infarction due to embolism of left posterior cerebral artery: Secondary | ICD-10-CM | POA: Diagnosis present

## 2023-03-27 DIAGNOSIS — J9601 Acute respiratory failure with hypoxia: Secondary | ICD-10-CM | POA: Diagnosis present

## 2023-03-27 DIAGNOSIS — Z992 Dependence on renal dialysis: Secondary | ICD-10-CM

## 2023-03-27 DIAGNOSIS — I120 Hypertensive chronic kidney disease with stage 5 chronic kidney disease or end stage renal disease: Secondary | ICD-10-CM | POA: Diagnosis present

## 2023-03-27 DIAGNOSIS — E875 Hyperkalemia: Secondary | ICD-10-CM | POA: Diagnosis present

## 2023-03-27 DIAGNOSIS — G40209 Localization-related (focal) (partial) symptomatic epilepsy and epileptic syndromes with complex partial seizures, not intractable, without status epilepticus: Secondary | ICD-10-CM | POA: Diagnosis present

## 2023-03-27 DIAGNOSIS — E785 Hyperlipidemia, unspecified: Secondary | ICD-10-CM | POA: Diagnosis present

## 2023-03-27 DIAGNOSIS — J810 Acute pulmonary edema: Secondary | ICD-10-CM | POA: Diagnosis present

## 2023-03-27 DIAGNOSIS — D631 Anemia in chronic kidney disease: Secondary | ICD-10-CM | POA: Diagnosis present

## 2023-03-27 DIAGNOSIS — Z8673 Personal history of transient ischemic attack (TIA), and cerebral infarction without residual deficits: Secondary | ICD-10-CM

## 2023-03-27 DIAGNOSIS — Z7901 Long term (current) use of anticoagulants: Secondary | ICD-10-CM

## 2023-03-27 DIAGNOSIS — R9431 Abnormal electrocardiogram [ECG] [EKG]: Secondary | ICD-10-CM | POA: Diagnosis present

## 2023-03-27 DIAGNOSIS — I340 Nonrheumatic mitral (valve) insufficiency: Secondary | ICD-10-CM | POA: Diagnosis present

## 2023-03-27 DIAGNOSIS — Z79899 Other long term (current) drug therapy: Secondary | ICD-10-CM

## 2023-03-27 DIAGNOSIS — Z5901 Sheltered homelessness: Secondary | ICD-10-CM

## 2023-03-27 DIAGNOSIS — Z91158 Patient's noncompliance with renal dialysis for other reason: Secondary | ICD-10-CM

## 2023-03-27 DIAGNOSIS — I517 Cardiomegaly: Secondary | ICD-10-CM | POA: Diagnosis present

## 2023-03-27 DIAGNOSIS — Z751 Person awaiting admission to adequate facility elsewhere: Secondary | ICD-10-CM

## 2023-03-27 DIAGNOSIS — R569 Unspecified convulsions: Secondary | ICD-10-CM | POA: Diagnosis present

## 2023-03-27 DIAGNOSIS — Z833 Family history of diabetes mellitus: Secondary | ICD-10-CM

## 2023-03-27 DIAGNOSIS — Z888 Allergy status to other drugs, medicaments and biological substances status: Secondary | ICD-10-CM

## 2023-03-27 DIAGNOSIS — G9340 Encephalopathy, unspecified: Secondary | ICD-10-CM | POA: Diagnosis not present

## 2023-03-27 DIAGNOSIS — G40901 Epilepsy, unspecified, not intractable, with status epilepticus: Secondary | ICD-10-CM

## 2023-03-27 DIAGNOSIS — Z91199 Patient's noncompliance with other medical treatment and regimen due to unspecified reason: Secondary | ICD-10-CM

## 2023-03-27 DIAGNOSIS — R4189 Other symptoms and signs involving cognitive functions and awareness: Secondary | ICD-10-CM | POA: Diagnosis present

## 2023-03-27 DIAGNOSIS — Z602 Problems related to living alone: Secondary | ICD-10-CM | POA: Diagnosis present

## 2023-03-27 HISTORY — DX: End stage renal disease: N18.6

## 2023-03-27 HISTORY — DX: End stage renal disease: Z99.2

## 2023-03-27 LAB — I-STAT ARTERIAL BLOOD GAS, ED
Acid-Base Excess: 0 mmol/L (ref 0.0–2.0)
Bicarbonate: 25.1 mmol/L (ref 20.0–28.0)
Calcium, Ion: 1.11 mmol/L — ABNORMAL LOW (ref 1.15–1.40)
HCT: 34 % — ABNORMAL LOW (ref 39.0–52.0)
Hemoglobin: 11.6 g/dL — ABNORMAL LOW (ref 13.0–17.0)
O2 Saturation: 96 %
Patient temperature: 98.9
Potassium: 4 mmol/L (ref 3.5–5.1)
Sodium: 137 mmol/L (ref 135–145)
TCO2: 26 mmol/L (ref 22–32)
pCO2 arterial: 40.8 mm[Hg] (ref 32–48)
pH, Arterial: 7.397 (ref 7.35–7.45)
pO2, Arterial: 81 mm[Hg] — ABNORMAL LOW (ref 83–108)

## 2023-03-27 LAB — I-STAT CHEM 8, ED
BUN: 27 mg/dL — ABNORMAL HIGH (ref 6–20)
Calcium, Ion: 1.03 mmol/L — ABNORMAL LOW (ref 1.15–1.40)
Chloride: 99 mmol/L (ref 98–111)
Creatinine, Ser: 12.1 mg/dL — ABNORMAL HIGH (ref 0.61–1.24)
Glucose, Bld: 133 mg/dL — ABNORMAL HIGH (ref 70–99)
HCT: 39 % (ref 39.0–52.0)
Hemoglobin: 13.3 g/dL (ref 13.0–17.0)
Potassium: 3.7 mmol/L (ref 3.5–5.1)
Sodium: 138 mmol/L (ref 135–145)
TCO2: 17 mmol/L — ABNORMAL LOW (ref 22–32)

## 2023-03-27 LAB — COMPREHENSIVE METABOLIC PANEL
ALT: 12 U/L (ref 0–44)
AST: 23 U/L (ref 15–41)
Albumin: 3.9 g/dL (ref 3.5–5.0)
Alkaline Phosphatase: 89 U/L (ref 38–126)
Anion gap: 30 — ABNORMAL HIGH (ref 5–15)
BUN: 25 mg/dL — ABNORMAL HIGH (ref 6–20)
CO2: 16 mmol/L — ABNORMAL LOW (ref 22–32)
Calcium: 9.6 mg/dL (ref 8.9–10.3)
Chloride: 95 mmol/L — ABNORMAL LOW (ref 98–111)
Creatinine, Ser: 11.18 mg/dL — ABNORMAL HIGH (ref 0.61–1.24)
GFR, Estimated: 5 mL/min — ABNORMAL LOW (ref >60)
Glucose, Bld: 133 mg/dL — ABNORMAL HIGH (ref 70–99)
Potassium: 3.7 mmol/L (ref 3.5–5.1)
Sodium: 141 mmol/L (ref 135–145)
Total Bilirubin: 1 mg/dL (ref 0.3–1.2)
Total Protein: 9 g/dL — ABNORMAL HIGH (ref 6.5–8.1)

## 2023-03-27 LAB — CBC WITH DIFFERENTIAL/PLATELET
Abs Immature Granulocytes: 0.05 10*3/uL (ref 0.00–0.07)
Basophils Absolute: 0.1 10*3/uL (ref 0.0–0.1)
Basophils Relative: 1 %
Eosinophils Absolute: 0.3 10*3/uL (ref 0.0–0.5)
Eosinophils Relative: 2 %
HCT: 37 % — ABNORMAL LOW (ref 39.0–52.0)
Hemoglobin: 11.5 g/dL — ABNORMAL LOW (ref 13.0–17.0)
Immature Granulocytes: 0 %
Lymphocytes Relative: 18 %
Lymphs Abs: 2.4 10*3/uL (ref 0.7–4.0)
MCH: 31.2 pg (ref 26.0–34.0)
MCHC: 31.1 g/dL (ref 30.0–36.0)
MCV: 100.3 fL — ABNORMAL HIGH (ref 80.0–100.0)
Monocytes Absolute: 1.2 10*3/uL — ABNORMAL HIGH (ref 0.1–1.0)
Monocytes Relative: 9 %
Neutro Abs: 9.4 10*3/uL — ABNORMAL HIGH (ref 1.7–7.7)
Neutrophils Relative %: 70 %
Platelets: 125 10*3/uL — ABNORMAL LOW (ref 150–400)
RBC: 3.69 MIL/uL — ABNORMAL LOW (ref 4.22–5.81)
RDW: 14.6 % (ref 11.5–15.5)
WBC: 13.4 10*3/uL — ABNORMAL HIGH (ref 4.0–10.5)
nRBC: 0 % (ref 0.0–0.2)

## 2023-03-27 LAB — GLUCOSE, CAPILLARY: Glucose-Capillary: 99 mg/dL (ref 70–99)

## 2023-03-27 LAB — ETHANOL: Alcohol, Ethyl (B): <10 mg/dL (ref <10)

## 2023-03-27 LAB — PROTIME-INR
INR: 1.1 (ref 0.8–1.2)
Prothrombin Time: 14.6 s (ref 11.4–15.2)

## 2023-03-27 LAB — CBG MONITORING, ED
Glucose-Capillary: 136 mg/dL — ABNORMAL HIGH (ref 70–99)
Glucose-Capillary: 103 mg/dL — ABNORMAL HIGH (ref 70–99)

## 2023-03-27 LAB — HEPATITIS B SURFACE ANTIGEN: Hepatitis B Surface Ag: NONREACTIVE

## 2023-03-27 MED ORDER — HEPARIN SODIUM (PORCINE) 5000 UNIT/ML IJ SOLN
5000.0000 [IU] | Freq: Three times a day (TID) | INTRAMUSCULAR | Status: DC
  Administered 2023-03-27 – 2023-03-31 (×10): 5000 [IU] via SUBCUTANEOUS
  Filled 2023-03-27 (×12): qty 1

## 2023-03-27 MED ORDER — HYDRALAZINE HCL 20 MG/ML IJ SOLN: 5.0000 mg | INTRAMUSCULAR | Status: DC | PRN

## 2023-03-27 MED ORDER — SODIUM CHLORIDE 0.9 % IV SOLN
1.0000 g | Freq: Once | INTRAVENOUS | Status: AC
  Administered 2023-03-27: 1 g via INTRAVENOUS
  Filled 2023-03-27: qty 10

## 2023-03-27 MED ORDER — HYDRALAZINE HCL 20 MG/ML IJ SOLN
10.0000 mg | INTRAMUSCULAR | Status: DC | PRN
  Administered 2023-03-27 – 2023-03-28 (×3): 10 mg via INTRAVENOUS
  Filled 2023-03-27 (×4): qty 1

## 2023-03-27 MED ORDER — LORAZEPAM 2 MG/ML IJ SOLN: 2.0000 mg | Freq: Once | INTRAMUSCULAR | Status: AC

## 2023-03-27 MED ORDER — DOCUSATE SODIUM 100 MG PO CAPS: 100.0000 mg | ORAL_CAPSULE | Freq: Two times a day (BID) | ORAL | Status: DC | PRN

## 2023-03-27 MED ORDER — CHLORHEXIDINE GLUCONATE CLOTH 2 % EX PADS
6.0000 | MEDICATED_PAD | Freq: Every day | CUTANEOUS | Status: DC
  Administered 2023-03-28 – 2023-03-31 (×4): 6 via TOPICAL

## 2023-03-27 MED ORDER — LORAZEPAM 2 MG/ML IJ SOLN
INTRAMUSCULAR | Status: AC
  Filled 2023-03-27: qty 1

## 2023-03-27 MED ORDER — SODIUM CHLORIDE 0.9 % IV SOLN
500.0000 mg | Freq: Once | INTRAVENOUS | Status: DC
  Filled 2023-03-27: qty 5

## 2023-03-27 MED ORDER — POLYETHYLENE GLYCOL 3350 17 G PO PACK: 17.0000 g | PACK | Freq: Every day | ORAL | Status: DC | PRN

## 2023-03-27 MED ORDER — LORAZEPAM 2 MG/ML IJ SOLN
2.0000 mg | Freq: Once | INTRAMUSCULAR | Status: AC
  Administered 2023-03-27: 2 mg via INTRAVENOUS
  Filled 2023-03-27: qty 1

## 2023-03-27 MED ORDER — LORAZEPAM 2 MG/ML IJ SOLN: 2.0000 mg | INTRAMUSCULAR | Status: DC | PRN

## 2023-03-27 MED ORDER — LORAZEPAM 2 MG/ML IJ SOLN
INTRAMUSCULAR | Status: AC
  Administered 2023-03-27: 2 mg via INTRAVENOUS
  Filled 2023-03-27: qty 1

## 2023-03-27 MED ORDER — INSULIN ASPART 100 UNIT/ML IJ SOLN: 0.0000 [IU] | INTRAMUSCULAR | Status: DC

## 2023-03-27 MED ORDER — LEVETIRACETAM IN NACL 1500 MG/100ML IV SOLN
1500.0000 mg | Freq: Once | INTRAVENOUS | Status: AC
  Administered 2023-03-27: 1500 mg via INTRAVENOUS
  Filled 2023-03-27: qty 100

## 2023-03-27 MED ORDER — LEVETIRACETAM IN NACL 500 MG/100ML IV SOLN
500.0000 mg | Freq: Two times a day (BID) | INTRAVENOUS | Status: DC
  Administered 2023-03-28: 500 mg via INTRAVENOUS
  Filled 2023-03-27: qty 100

## 2023-03-27 NOTE — Consult Note (Signed)
Renal Service Consult Note [facility]  ZALAN SHIDLER 03/27/2023 Maree Krabbe, MD Requesting Physician: Dr. Rush Landmark  Reason for Consult: ESRD pt w/ AMS, seizures HPI: The patient is a 42 y.o. year-old w/ PMH as below who presented to ED sent from HD (after HD sessions completed) for AMS. Pt came to HD w/o a shirt and was not acting himself per ED notes. Hx of seizures on keppra. About 30 min after arriving pt had a seizure. IV ativan was ordered and O2 applied. Pt w/ eyes open but not responding verbally. CCM called. We are asked to see for esrd.    Pt seen in ED room. Not in distress, but not responding to questions. Maybe a bit lethargic. No asterixis or myoclonic jerking.   Pt has generally good compliance, misses dialysis maybe once every 2 wks, signs off early maybe 1 out of 4 sessions per the app.   ROS - no hx obtained   Past Medical History  Past Medical History:  Diagnosis Date   ESRD on hemodialysis (HCC)    Hypertension    Lupus    Seizures (HCC)    Seizures (HCC)    Stroke (HCC)    51 or 42 years of age.     Wears glasses    Past Surgical History  Past Surgical History:  Procedure Laterality Date   AV FISTULA PLACEMENT Left 12/30/2017   Procedure: ARTERIOVENOUS (AV) FISTULA CREATION VERSUS INSERTION OF ARTERIOVENOUS GRAFT LEFT ARM;  Surgeon: Larina Earthly, MD;  Location: [facility] OR;  Service: Vascular;  Laterality: Left;   BASCILIC VEIN TRANSPOSITION Left 08/15/2018   Procedure: LEFT FIRST STAGE BASCILIC VEIN TRANSPOSITION;  Surgeon: Larina Earthly, MD;  Location: [facility] OR;  Service: Vascular;  Laterality: Left;   BASCILIC VEIN TRANSPOSITION Left 10/30/2018   Procedure: INSERTION OF GORE-TEX GRAFT LEFT UPPER ARM;  Surgeon: Maeola Harman, MD;  Location: [facility] OR;  Service: Vascular;  Laterality: Left;   INSERTION OF DIALYSIS CATHETER N/A 09/20/2018   Procedure: INSERTION OF TUNNELED DIALYSIS CATHETER RIGHT INTERNAL JUGULAR;  Surgeon: Cephus Shelling, MD;  Location: [facility] OR;  Service: Vascular;  Laterality: N/A;   IR FLUORO GUIDE CV LINE RIGHT  07/16/2022   IR US GUIDE VASC ACCESS RIGHT  07/16/2022   RENAL BIOPSY     Family History  Family History  Problem Relation Age of Onset   Diabetes Mother    Diabetes Maternal Grandmother    Seizures Neg Hx        not on mother's side. he doesn't know about his father's side   Social History  reports that he has an unknown smoking status. He has never used smokeless tobacco. He reports that he does not currently use alcohol. He reports that he does not use drugs. Allergies  Allergies  Allergen Reactions   Zyrtec [Cetirizine] Swelling    FACIAL SWELLING, NOT CATEGORIZED   Cetirizine & Related Swelling   Home medications Prior to Admission medications   Medication Sig Start Date End Date Taking? Authorizing Provider  acetaminophen (TYLENOL) 325 MG tablet Take 2 tablets (650 mg total) by mouth every 6 (six) hours as needed for mild pain. 02/13/23   Lonia Blood, MD  amLODipine (NORVASC) 10 MG tablet Take 1 tablet (10 mg total) by mouth at bedtime. For blood pressure 08/22/22   Claiborne Rigg, NP  atorvastatin (LIPITOR) 40 MG tablet Take 1 tablet (40 mg total) by mouth daily. 02/14/23   McClung,  Elpidio Eric, MD  carvedilol (COREG) 12.5 MG tablet Take 1 tablet (12.5 mg total) by mouth 2 (two) times daily with a meal. DOSE CHANGE 08/21/22   Claiborne Rigg, NP  cinacalcet (SENSIPAR) 30 MG tablet Take 2 tablets (60 mg total) by mouth daily with supper. 02/13/23   Lonia Blood, MD  cinacalcet (SENSIPAR) 90 MG tablet Take 90 mg by mouth daily. 06/01/22   [provider]  docusate sodium (COLACE) 100 MG capsule Take 1 capsule (100 mg total) by mouth 2 (two) times daily as needed for mild constipation. 02/13/23   Lonia Blood, MD  ELIQUIS 2.5 MG TABS tablet TAKE 1 TABLET(2.5 MG) BY MOUTH TWICE DAILY 08/29/22   Georgian Co M, PA-C  levETIRAcetam (KEPPRA) 1000 MG tablet  Take 1 tablet (1,000 mg total) by mouth daily. 12/28/22 01/27/23  Claude Manges, PA-C  levETIRAcetam (KEPPRA) 500 MG tablet Take 1 tablet (500 mg total) by mouth 2 (two) times daily. 02/13/23 05/14/23  Lonia Blood, MD  midodrine (PROAMATINE) 5 MG tablet Take 1 tablet (5 mg total) by mouth 3 (three) times daily with meals. 02/13/23   Lonia Blood, MD  multivitamin (RENA-VIT) TABS tablet Take 1 tablet by mouth at bedtime. 02/13/23   Lonia Blood, MD  rosuvastatin (CRESTOR) 40 MG tablet Take 40 mg by mouth daily. 06/03/22   [provider]  sevelamer carbonate (RENVELA) 800 MG tablet Take 4 tablets (3,200 mg total) by mouth 3 (three) times daily with meals. 02/13/23 05/14/23  Lonia Blood, MD  warfarin (COUMADIN) 7.5 MG tablet Take 7.5mg  daily at 6pm 02/13/23   Jetty Duhamel T, MD     Vitals:   03/27/23 1405 03/27/23 1415 03/27/23 1428 03/27/23 1429  BP:  (!) 153/127    Pulse:  (!) 119    Resp:  16    Temp:      TempSrc:      SpO2: (!) 88% 95% (!) 86% 91%  Height:       Exam Gen awake, not responding verbally No rash, cyanosis or gangrene Sclera anicteric, throat clear  No jvd or bruits Chest clear bilat to bases, no rales/ wheezing RRR no MRG Abd soft ntnd no mass or ascites +bs GU defer MS no joint effusions or deformity Ext 1+ pretib edema Neuro is alert, uses all limbs equally, not responding    RIJ TDC intact    Renal-related home meds: - norvasc 10 - coreg 12.5 bid - sensipar 60mg  hs - eliquis 2.5 bid - midodrine 5mg  tid - renavite - renvela 4 ac tid    OP HD: [facility] MWF  4h   400/800   101.5kg   2/2 bath   RIJ TDC    Heparin none - last OP HD today 10/9, post wt 110.5kg (9kg over) - mircera 50 mcg IV q 2wks, last 10/2, due 10/16  - venofer 100mg  three times per week thru today - rocaltrol 2.0 mcg     cXR 10/09 - IMPRESSION: Cardiomegaly with vascular congestion and diffuse hazy pulmonary density suspicious for mild pulmonary  edema.  Assessment/ Plan: Seizures - acute on chronic, takes Keppra at home AMS - not entirely clear the cause yet. CCM is investigating.  ESRD - on HD MWF. Had full 4hr HD today. Will try to get extra HD done overnight (staffing allowed) w/ fluid removal only.  HTN/ BP - BP's a bit high here.  Volume - has been coming off HD routinely 8- 9  kg over the dry wt. CXR is bilat wet/ edema. Anemia esrd - Hb 11- 13, no esa needs. Next esa due 10/16.  MBD ckd - CCa in range, add on phos. Resume binder, sensipar and po vdra when pt is eating/ taking pills.       Vinson Moselle  MD [facility] 03/27/2023, 4:58 PM  Recent Labs  Lab 03/27/23 1329 03/27/23 1340 03/27/23 1404  HGB 11.5* 13.3 11.6*  ALBUMIN 3.9  --   --   CALCIUM 9.6  --   --   CREATININE 11.18* 12.10*  --   K 3.7 3.7 4.0   Inpatient medications:  LORazepam        azithromycin     LORazepam

## 2023-03-27 NOTE — Progress Notes (Signed)
eLink Physician-Brief Progress Note Patient Name: [name] [name] DOB: Jul 14, 1980 MRN: [id number]   Date of Service  03/27/2023  HPI/Events of Note  42 year old male presented to the emergency department with encephalopathy and hypoxia requiring 5 L in the setting of seizure disorder thought to be postictal.  Presented with tachypnea, tachycardia and hypertension.  Studies show adequate oxygenation and ventilation.  Acute anion gap metabolic acidosis with elevated creatinine.  Leukocytosis and anemia.  Advanced atrophy but no intracranial abnormalities.  Mild pulmonary edema on chest radiograph  eICU Interventions  Maintain n.p.o., supportive care  GI prophylaxis not indicated DVT prophylaxis with heparin   0544 -ongoing agitation, persistently restless and moving around in bed.  Add olanzapine every 6 hours as needed.  QTc 485.  Intervention Category Evaluation Type: New Patient Evaluation  Janea Schwenn 03/27/2023, 8:16 PM

## 2023-03-27 NOTE — ED Provider Notes (Signed)
[place] EMERGENCY DEPARTMENT AT [facility] Provider Note   CSN: [id number] Arrival date & time: 03/27/23  1245     History  Chief Complaint  Patient presents with   Altered Mental Status   Hypertension    [name] [name] is a 42 y.o. male.  The history is provided by the patient and medical records. The history is limited by the condition of the patient. No language interpreter was used.  Altered Mental Status Presenting symptoms: confusion, partial responsiveness and unresponsiveness   Severity:  Severe Most recent episode:  Today Episode history:  Single Timing:  Unable to specify Progression:  Unable to specify Associated symptoms: agitation and seizures   Associated symptoms: no abdominal pain, no fever and no weakness   Hypertension Associated symptoms include shortness of breath. Pertinent negatives include no chest pain and no abdominal pain.       Home Medications Prior to Admission medications   Medication Sig Start Date End Date Taking? Authorizing Provider  acetaminophen (TYLENOL) 325 MG tablet Take 2 tablets (650 mg total) by mouth every 6 (six) hours as needed for mild pain. 02/13/23   Lonia Blood, MD  amLODipine (NORVASC) 10 MG tablet Take 1 tablet (10 mg total) by mouth at bedtime. For blood pressure 08/22/22   Claiborne Rigg, NP  atorvastatin (LIPITOR) 40 MG tablet Take 1 tablet (40 mg total) by mouth daily. 02/14/23   Lonia Blood, MD  carvedilol (COREG) 12.5 MG tablet Take 1 tablet (12.5 mg total) by mouth 2 (two) times daily with a meal. DOSE CHANGE 08/21/22   Claiborne Rigg, NP  cinacalcet (SENSIPAR) 30 MG tablet Take 2 tablets (60 mg total) by mouth daily with supper. 02/13/23   Lonia Blood, MD  cinacalcet (SENSIPAR) 90 MG tablet Take 90 mg by mouth daily. 06/01/22   [provider]  docusate sodium (COLACE) 100 MG capsule Take 1 capsule (100 mg total) by mouth 2 (two) times daily as needed for mild constipation.  02/13/23   Lonia Blood, MD  ELIQUIS 2.5 MG TABS tablet TAKE 1 TABLET(2.5 MG) BY MOUTH TWICE DAILY 08/29/22   Georgian Co M, PA-C  levETIRAcetam (KEPPRA) 1000 MG tablet Take 1 tablet (1,000 mg total) by mouth daily. 12/28/22 01/27/23  Claude Manges, PA-C  levETIRAcetam (KEPPRA) 500 MG tablet Take 1 tablet (500 mg total) by mouth 2 (two) times daily. 02/13/23 05/14/23  Lonia Blood, MD  midodrine (PROAMATINE) 5 MG tablet Take 1 tablet (5 mg total) by mouth 3 (three) times daily with meals. 02/13/23   Lonia Blood, MD  multivitamin (RENA-VIT) TABS tablet Take 1 tablet by mouth at bedtime. 02/13/23   Lonia Blood, MD  rosuvastatin (CRESTOR) 40 MG tablet Take 40 mg by mouth daily. 06/03/22   [provider]  sevelamer carbonate (RENVELA) 800 MG tablet Take 4 tablets (3,200 mg total) by mouth 3 (three) times daily with meals. 02/13/23 05/14/23  Lonia Blood, MD  warfarin (COUMADIN) 7.5 MG tablet Take 7.5mg  daily at 6pm 02/13/23   Lonia Blood, MD      Allergies    Zyrtec [cetirizine] and Cetirizine & related    Review of Systems   Review of Systems  Unable to perform ROS: Mental status change (post ictal and not answer questions)  Constitutional:  Negative for fever.  HENT:  Negative for congestion.   Respiratory:  Positive for shortness of breath. Negative for cough, chest tightness and wheezing.  Cardiovascular:  Negative for chest pain.  Gastrointestinal:  Negative for abdominal pain.  Genitourinary:  Negative for dysuria.  Musculoskeletal:  Negative for back pain.  Neurological:  Positive for seizures. Negative for weakness.  Psychiatric/Behavioral:  Positive for agitation and confusion.     Physical Exam Updated Vital Signs BP (!) 184/136   Pulse 98   Temp 98.9 F (37.2 C) (Oral)   Resp 18   Ht 6\' 1"  (1.854 m)   SpO2 98%   BMI 28.76 kg/m  Physical Exam Vitals and nursing note reviewed.  Constitutional:      General: He is in acute  distress.     Appearance: He is well-developed. He is ill-appearing.  HENT:     Head: Normocephalic.     Nose: No congestion or rhinorrhea.     Mouth/Throat:     Mouth: Mucous membranes are moist.     Pharynx: No oropharyngeal exudate or posterior oropharyngeal erythema.     Comments: Abrasion on L anterior tongue, no large laceration seen initially Eyes:     Extraocular Movements: Extraocular movements intact.     Conjunctiva/sclera: Conjunctivae normal.     Pupils: Pupils are equal, round, and reactive to light.  Cardiovascular:     Rate and Rhythm: Normal rate and regular rhythm.     Heart sounds: No murmur heard. Pulmonary:     Effort: Respiratory distress present.     Breath sounds: Rhonchi present. No wheezing or rales.  Chest:     Chest wall: No tenderness.  Abdominal:     Palpations: Abdomen is soft.     Tenderness: There is no abdominal tenderness. There is no guarding or rebound.  Musculoskeletal:        General: No swelling or tenderness.     Cervical back: Neck supple. No tenderness.  Skin:    General: Skin is warm and dry.     Capillary Refill: Capillary refill takes less than 2 seconds.     Findings: No erythema.  Neurological:     Mental Status: He is alert.     Motor: No weakness.  Psychiatric:        Mood and Affect: Mood normal.     ED Results / Procedures / Treatments   Labs (all labs ordered are listed, but only abnormal results are displayed) Labs Reviewed  CBC WITH DIFFERENTIAL/PLATELET - Abnormal; Notable for the following components:      Result Value   WBC 13.4 (*)    RBC 3.69 (*)    Hemoglobin 11.5 (*)    HCT 37.0 (*)    MCV 100.3 (*)    Platelets 125 (*)    Neutro Abs 9.4 (*)    Monocytes Absolute 1.2 (*)    All other components within normal limits  COMPREHENSIVE METABOLIC PANEL - Abnormal; Notable for the following components:   Chloride 95 (*)    CO2 16 (*)    Glucose, Bld 133 (*)    BUN 25 (*)    Creatinine, Ser 11.18 (*)     Total Protein 9.0 (*)    GFR, Estimated 5 (*)    Anion gap 30 (*)    All other components within normal limits  CBG MONITORING, ED - Abnormal; Notable for the following components:   Glucose-Capillary 136 (*)    All other components within normal limits  I-STAT ARTERIAL BLOOD GAS, ED - Abnormal; Notable for the following components:   pO2, Arterial 81 (*)    Calcium, Ion 1.11 (*)  HCT 34.0 (*)    Hemoglobin 11.6 (*)    All other components within normal limits  I-STAT CHEM 8, ED - Abnormal; Notable for the following components:   BUN 27 (*)    Creatinine, Ser 12.10 (*)    Glucose, Bld 133 (*)    Calcium, Ion 1.03 (*)    TCO2 17 (*)    All other components within normal limits  PROTIME-INR  URINALYSIS, ROUTINE W REFLEX MICROSCOPIC  ETHANOL  RAPID URINE DRUG SCREEN, HOSP PERFORMED  CBG MONITORING, ED    EKG EKG Interpretation Date/Time:  Wednesday March 27 2023 13:32:55 EDT Ventricular Rate:  135 PR Interval:  119 QRS Duration:  104 QT Interval:  397 QTC Calculation: 596 R Axis:   269  Text Interpretation: Sinus tachycardia Right superior axis Probable right ventricular hypertrophy Probable anterolateral infarct, old Prolonged QT interval when compared to prior, faster rate. No STEMI Confirmed by Theda Belfast ([id number]) on 03/27/2023 2:57:42 PM  Radiology CT HEAD WO CONTRAST ( )  Result Date: 03/27/2023 CLINICAL DATA:  Mental status change EXAM: CT HEAD WITHOUT CONTRAST TECHNIQUE: Contiguous axial images were obtained from the base of the skull through the vertex without intravenous contrast. RADIATION DOSE REDUCTION: This exam was performed according to the departmental dose-optimization program which includes automated exposure control, adjustment of the mA and/or kV according to patient size and/or use of iterative reconstruction technique. COMPARISON:  CT brain 01/02/2023, 12/29/2022, 11/08/2022, MRI 08/10/2022 FINDINGS: Brain: Negative for hemorrhage, mass effect or  midline shift. Atrophy and advanced chronic small vessel ischemic changes of the white matter. Chronic left parietal infarct with encephalomalacia. Interval left posterior parieto-occipital infarct, series 2, image 24 which appears more subacute to chronic but is new from CT in July. Multiple small chronic infarcts in the left cerebellum, though with suspected interval left cerebellar hypo density on series 2, image 12. Vascular: No hyperdense vessels.  No unexpected calcification Skull: Normal. Negative for fracture or focal lesion. Sinuses/Orbits: Mild mucosal sinus changes Other: None IMPRESSION: 1. Negative for acute intracranial hemorrhage. 2. Atrophy and advanced chronic small vessel ischemic changes of the white matter. Chronic left parietal infarct. Interval left posterior parieto-occipital infarct which appears more subacute to chronic in nature but is new from CT in July. Multiple small chronic infarcts in the left cerebellum, though with suspected interval left cerebellar hypo density, raising concern for possible acute or subacute infarct. Consider further assessment with MRI. Electronically Signed   By: Jasmine Pang M.D.   On: 03/27/2023 15:35   DG Chest Portable 1 View  Result Date: 03/27/2023 CLINICAL DATA:  Hypoxia EXAM: PORTABLE CHEST 1 VIEW COMPARISON:  01/02/2023 FINDINGS: Right-sided central venous catheter tip at the right atrium. Cardiomegaly with vascular congestion and diffuse hazy pulmonary density consistent with edema. No pleural effusion or pneumothorax. Partially visualized stent in the left upper extremity. IMPRESSION: Cardiomegaly with vascular congestion and diffuse hazy pulmonary density suspicious for mild pulmonary edema. Electronically Signed   By: Jasmine Pang M.D.   On: 03/27/2023 15:25    Procedures Procedures    Medications Ordered in ED Medications  LORazepam (ATIVAN) 2 MG/ML injection (has no administration in time range)  levETIRAcetam (KEPPRA) IVPB 1500 mg/  100 mL premix (has no administration in time range)    ED Course/ Medical Decision Making/ A&P  Medical Decision Making Amount and/or Complexity of Data Reviewed Labs: ordered. Radiology: ordered.  Risk Prescription drug management. Decision regarding hospitalization.    [name] [name] is a 42 y.o. male with a past medical history significant for previous stroke, hypertension, ESRD on dialysis, previous seizure, and recent admission for respiratory failure in the setting of seizure and aspiration pneumonitis requiring intubation who presents for abnormal behavior and seizure.  Patient reportedly brought from dialysis because he was acting different and showed up without a shirt.  Patient reportedly had no complaints initially but by the time I went to go assess the patient, he was having a seizure.  Patient was shaking extremities and head bit his tongue with some abrasion and bleeding.  Options saturations were in the 60s on room air so he was placed on nonrebreather.  Patient was not responding so IV was placed and Ativan was given.  Seizure appears to have stopped the patient is quite postictal.  He is not answering any questions and not following commands.  Oxygen saturation is in the 90s on his nonrebreather and we will try to de-escalate.  On exam, no evidence of acute trauma.  Lungs had coarseness and chest and abdomen did not appear tender.  Pupils are symmetric and reactive but he would not follow commands yet.  CBG over 100.  Clinically I am concerned patient had a recurrent seizure may have had one earlier today as well.  Will get CT head as he recently started Coumadin and will get x-ray to look for pneumonia or aspiration given his breath sounds and hypoxia.  Will give Keppra and he had the Ativan.  Will get screening labs.  Anticipate reassessment after workup to determine disposition.  2:43 PM Spoke to neurology who will come see patient.   They agreed with head CT given the recent Coumadin and recent stroke and anticipate admission with the persistent hypoxia.  Will get other labs and imaging.  3:44 PM Patient still hypoxic when he messes with his oxygen delivery device.  It drops into the 70s.  Patient occasionally getting agitated trying to get out of bed, will give more Ativan to help him relax.  CT returned showing no bleed but possible new stroke.  Will order MRI as recommended by neurology.  They will come see him.  He is getting the Keppra and we will give antibiotics as I am concerned about aspiration and possible pneumonia given his recent pneumonia.  His x-ray shows some edema versus opacities, given the rhonchi will give antibiotics.  Due to his quick desaturations to the 70s, altered mental status intermittently, recent intubation, will talk to critical care to make sure they do not think he needs to be admitted to the ICU.        Final Clinical Impression(s) / ED Diagnoses Final diagnoses:  Hypoxia  Altered mental status, unspecified altered mental status type  Seizure (HCC)      Clinical Impression: 1. Hypoxia   2. Altered mental status, unspecified altered mental status type   3. Seizure [facility])     Disposition: Admit  This note was prepared with assistance of Dragon voice recognition software. Occasional wrong-word or sound-a-like substitutions may have occurred due to the inherent limitations of voice recognition software.     Cassian Torelli, Canary Brim, MD 03/27/23 [phone]
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
## 2023-03-27 NOTE — ED Notes (Signed)
Noticed patient heart rate elevated on the cardiac monitor and patient oxygen level low. Went to check on patient. Pt having a seizure. Another RN went to get Dr. Rush Landmark. Pt had blood coming out of his mouth. Suction connected. Oxygen applied. Pt shaking. Dr. Rush Landmark at bedside. IV placed. Verbal order for 2mg  of Ativan per Dr. Rush Landmark.

## 2023-03-27 NOTE — ED Notes (Signed)
Pt transported to CT scan with RN. Per Dr. Rush Landmark okay to take Ativan 2mg  just in case patient had a seizure in Ct scan.

## 2023-03-27 NOTE — ED Provider Notes (Signed)
  Physical Exam  BP (!) 153/127   Pulse (!) 119   Temp 98.3 F (36.8 C) (Axillary)   Resp 16   Ht 6\' 1"  (1.854 m)   SpO2 91%   BMI 28.76 kg/m   Physical Exam  Procedures  Procedures  ED Course / MDM    Medical Decision Making Amount and/or Complexity of Data Reviewed Labs: ordered. Radiology: ordered.  Risk Prescription drug management. Decision regarding hospitalization.   I, Rosana Berger, assumed care for this patient.  Brief 42 year old male, history of seizure disorder.  Patient was signed out to me pending ICU evaluation for presumed recurrent seizures.  ICU came down to evaluate the patient and agreed with admission to intensive care unit.      Anders Simmonds T, DO 03/27/23 1719

## 2023-03-27 NOTE — ED Triage Notes (Signed)
Pt bib ems from dialysis.  Pt showed up to dialysis without a shirt, throughout dialysis staff reports he has not been acting himself.  Pt has no complaints.  Pt has hx of seizures, states he takes Keppra.  Pt alert, oriented to self and situation (states he thinks he had a seizure).

## 2023-03-27 NOTE — ED Notes (Signed)
Pt not responding  to be roomed  
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
## 2023-03-28 ENCOUNTER — Other Ambulatory Visit (HOSPITAL_COMMUNITY): Payer: Self-pay

## 2023-03-28 ENCOUNTER — Inpatient Hospital Stay (HOSPITAL_COMMUNITY): Payer: 59

## 2023-03-28 ENCOUNTER — Encounter (HOSPITAL_COMMUNITY): Payer: Self-pay

## 2023-03-28 DIAGNOSIS — R569 Unspecified convulsions: Secondary | ICD-10-CM | POA: Diagnosis not present

## 2023-03-28 LAB — CBC
HCT: 33.5 % — ABNORMAL LOW (ref 39.0–52.0)
Hemoglobin: 11 g/dL — ABNORMAL LOW (ref 13.0–17.0)
MCH: 31 pg (ref 26.0–34.0)
MCHC: 32.8 g/dL (ref 30.0–36.0)
MCV: 94.4 fL (ref 80.0–100.0)
Platelets: 114 10*3/uL — ABNORMAL LOW (ref 150–400)
RBC: 3.55 MIL/uL — ABNORMAL LOW (ref 4.22–5.81)
RDW: 14.4 % (ref 11.5–15.5)
WBC: 11.6 10*3/uL — ABNORMAL HIGH (ref 4.0–10.5)
nRBC: 0 % (ref 0.0–0.2)

## 2023-03-28 LAB — GLUCOSE, CAPILLARY
Glucose-Capillary: 103 mg/dL — ABNORMAL HIGH (ref 70–99)
Glucose-Capillary: 104 mg/dL — ABNORMAL HIGH (ref 70–99)
Glucose-Capillary: 106 mg/dL — ABNORMAL HIGH (ref 70–99)
Glucose-Capillary: 110 mg/dL — ABNORMAL HIGH (ref 70–99)
Glucose-Capillary: 111 mg/dL — ABNORMAL HIGH (ref 70–99)
Glucose-Capillary: 112 mg/dL — ABNORMAL HIGH (ref 70–99)
Glucose-Capillary: 81 mg/dL (ref 70–99)
Glucose-Capillary: 90 mg/dL (ref 70–99)

## 2023-03-28 LAB — BASIC METABOLIC PANEL
Anion gap: 16 — ABNORMAL HIGH (ref 5–15)
BUN: 33 mg/dL — ABNORMAL HIGH (ref 6–20)
CO2: 26 mmol/L (ref 22–32)
Calcium: 10 mg/dL (ref 8.9–10.3)
Chloride: 95 mmol/L — ABNORMAL LOW (ref 98–111)
Creatinine, Ser: 13.59 mg/dL — ABNORMAL HIGH (ref 0.61–1.24)
GFR, Estimated: 4 mL/min — ABNORMAL LOW (ref >60)
Glucose, Bld: 102 mg/dL — ABNORMAL HIGH (ref 70–99)
Potassium: 4.4 mmol/L (ref 3.5–5.1)
Sodium: 137 mmol/L (ref 135–145)

## 2023-03-28 LAB — MAGNESIUM: Magnesium: 2.4 mg/dL (ref 1.7–2.4)

## 2023-03-28 MED ORDER — LIDOCAINE-PRILOCAINE 2.5-2.5 % EX CREA: 1.0000 | TOPICAL_CREAM | CUTANEOUS | Status: DC | PRN

## 2023-03-28 MED ORDER — LEVETIRACETAM 500 MG PO TABS
500.0000 mg | ORAL_TABLET | Freq: Two times a day (BID) | ORAL | Status: DC
  Administered 2023-03-28 – 2023-04-20 (×45): 500 mg via ORAL
  Filled 2023-03-28 (×46): qty 1

## 2023-03-28 MED ORDER — ALTEPLASE 2 MG IJ SOLR: 2.0000 mg | Freq: Once | INTRAMUSCULAR | Status: DC | PRN

## 2023-03-28 MED ORDER — STERILE WATER FOR INJECTION IJ SOLN
INTRAMUSCULAR | Status: AC
  Administered 2023-03-28: 10 mL
  Filled 2023-03-28: qty 10

## 2023-03-28 MED ORDER — HEPARIN SODIUM (PORCINE) 1000 UNIT/ML DIALYSIS
1000.0000 [IU] | INTRAMUSCULAR | Status: DC | PRN
  Administered 2023-03-28: 3200 [IU]
  Filled 2023-03-28 (×2): qty 1

## 2023-03-28 MED ORDER — ANTICOAGULANT SODIUM CITRATE 4% (200MG/5ML) IV SOLN: 5.0000 mL | Status: DC | PRN

## 2023-03-28 MED ORDER — LIDOCAINE HCL (PF) 1 % IJ SOLN: 5.0000 mL | INTRAMUSCULAR | Status: DC | PRN

## 2023-03-28 MED ORDER — NEPRO/CARBSTEADY PO LIQD: 237.0000 mL | ORAL | Status: DC | PRN

## 2023-03-28 MED ORDER — OLANZAPINE 10 MG IM SOLR
5.0000 mg | Freq: Three times a day (TID) | INTRAMUSCULAR | Status: DC | PRN
  Administered 2023-03-28 – 2023-03-31 (×2): 5 mg via INTRAVENOUS
  Filled 2023-03-28 (×4): qty 10

## 2023-03-28 MED ORDER — CHLORHEXIDINE GLUCONATE CLOTH 2 % EX PADS: 6.0000 | MEDICATED_PAD | Freq: Every day | CUTANEOUS | Status: DC

## 2023-03-28 MED ORDER — PENTAFLUOROPROP-TETRAFLUOROETH EX AERO: 1.0000 | INHALATION_SPRAY | CUTANEOUS | Status: DC | PRN

## 2023-03-28 NOTE — Progress Notes (Signed)
  Received patient in bed to unit.   Informed consent signed and in chart.    TX duration: 3 hrs     Hand-off given to patient's nurse.    Access used: R internal jugular CVC Access issues: Machine did not count down time frequently d/t poor catheter flow Caused by constant pt movement   Total UF removed: 4000 mL Medication(s) given: None Post HD VS: WDL x hypertension Post HD weight: 98.1 kg      [facility]

## 2023-03-28 NOTE — Progress Notes (Signed)
EEG complete - results pending 

## 2023-03-28 NOTE — Progress Notes (Addendum)
NEUROLOGY follow-up NOTE   Date of service: March 28, 2023 Patient Name: [name] [name] MRN:  [id number] DOB:  [birth year]  Brief HPI  [name] [name] is a 42 y.o. male with PMH of ESRD on HD MWF, antiphospholipid syndrome prescribed warfarin, hx of PCA infarct, seizure, HTN with recent admission for respiratory failure requiring intubation in the setting of seizure and aspiration pneumonitis who presented from HD with abnormal behavior including not wearing a shirt to HD center. In the ED, he became hypoxic and had witnessed seizure. Was given IV ativan and keppra load. Admitted to ICU with concerns for need for intubation and agitation.  First CVA was at age 62. Found to have antiphospholipid syndrome. Recently had multiple admissions and ED visits for seizures and was admitted with acute PCA CVA 2/2 to hypercoagulable state versus cardio emboli in August.    Interval Hx/subjective   Routine EEG with moderate to severe diffuse encephalopathy, no seizures or epileptiform discharges. Patient agitated overnight, given olanzapine. BP with systolic between 528U-132G overnight. Remains on 5L [place]. Afebrile  He reports pain in his arms, at first saying it is his right upper extremity but changing to left during exam. He appears confused and keeps motioning to waist and mentioning a belt that is on, although none is present currently.  He refused to answer orientation question and is frustrated as someone already asked those questions this morning.  Vitals  Current vital signs: BP (!) 178/132   Pulse (!) 111   Temp 98.5 F (36.9 C) (Axillary)   Resp 14   Ht 6\' 1"  (1.854 m)   Wt 101.7 kg   SpO2 92%   BMI 29.58 kg/m  Vital signs in last 24 hours: Temp:  [98.3 F (36.8 C)-99.5 F (37.5 C)] 98.5 F (36.9 C) (10/10 0900) Pulse Rate:  [74-181] 111 (10/10 0900) Resp:  [14-40] 14 (10/10 0900) BP: (137-205)/(79-172) 178/132 (10/10 0900) SpO2:  [86 %-100 %] 92 % (10/10 0900) Weight:  [101.7  kg-109.8 kg] 101.7 kg (10/10 0500)   Body mass index is 29.58 kg/m.  Physical Exam   Constitutional: Appears well-developed and well-nourished.  Cardiovascular: Normal rate and regular rhythm. HD catheter in place to right internal jugular, left fistula without bruit Respiratory: Effort normal, non-labored breathing, stable sats on 5L [place] GI: Soft.  No distension. There is no tenderness.   Neurologic Examination   Mental Status: Patient is awake and alert. Oriented to self and location, refuses to state time No signs of aphasia or neglect Cranial Nerves: II: Pupils equal, round, and reactive to light.   III,IV, VI: EOMI V: unable to assess VII: Facial movement is symmetric.  VIII: Hearing is intact to voice X: Uvula elevates symmetrically XI: unable to assess XII: Tongue is midline without atrophy or fasciculations.  Motor: moves upper and lower extremities, but limited by pain, lower extremities 5/5 bilaterally  Labs and Diagnostic Imaging   CBC:  Recent Labs  Lab 03/27/23 1329 03/27/23 1340 03/27/23 1404 03/28/23 0316  WBC 13.4*  --   --  11.6*  NEUTROABS 9.4*  --   --   --   HGB 11.5*   < > 11.6* 11.0*  HCT 37.0*   < > 34.0* 33.5*  MCV 100.3*  --   --  94.4  PLT 125*  --   --  114*   < > = values in this interval not displayed.    Basic Metabolic Panel:     Latest Ref Rng &  Units 03/28/2023    3:16 AM 03/27/2023    2:04 PM 03/27/2023    1:40 PM  BMP  Glucose 70 - 99 mg/dL 629   528   BUN 6 - 20 mg/dL 33   27   Creatinine 4.13 - 1.24 mg/dL 24.40   10.27   Sodium 135 - 145 mmol/L 137  137  138   Potassium 3.5 - 5.1 mmol/L 4.4  4.0  3.7   Chloride 98 - 111 mmol/L 95   99   CO2 22 - 32 mmol/L 26     Calcium 8.9 - 10.3 mg/dL 25.3      Lipid Panel:  Lab Results  Component Value Date   LDLCALC 105 (H) 01/27/2023   HgbA1c:  Lab Results  Component Value Date   HGBA1C 5.2 01/19/2023    INR  Lab Results  Component Value Date   INR 1.1 03/27/2023    ABG: pH 7.3/ O2 81/ CO2 40.8  CT Head without contrast(Personally reviewed): IMPRESSION: 1. Negative for acute intracranial hemorrhage. 2. Atrophy and advanced chronic small vessel ischemic changes of the white matter. Chronic left parietal infarct. Interval left posterior parieto-occipital infarct which appears more subacute to chronic in nature but is new from CT in July. Multiple small chronic infarcts in the left cerebellum, though with suspected interval left cerebellar hypo density, raising concern for possible acute or subacute infarct. Consider further assessment with MRI.   Impression   [name] [name] is a 42 y.o. male with PMH of ESRD on HD MWF, antiphospholipid syndrome prescribed warfarin, multiple infarcts (most recent left PCA 01/2023), seizure, HTN with recent admission for respiratory failure requiring intubation in the setting of seizure and aspiration pneumonitis who presented from HD with abnormal behavior including not wearing a shirt to HD center. In the ED, he became hypoxic and had witnessed seizure. Was given IV ativan and keppra load.  Patient reports missing several doses of keppra weekly. He plans to set alarms and have pill box to help with adherence. Since his INR was so low would like to get MRI with concern for possible new stroke. No changes are planned for AEDs.  Recommendations   - continue keppra 500 mg twice daily home dose  -MRI head when able to tolerate -neuro will sign off if MRI with no new stroke ______________________________________________________________________   Thank you for the opportunity to take part in the care of this patient. If you have any further questions, please contact the neurology consultation team on call. Updated oncall schedule is listed on AMION.  Signed,  Memory Dance. Masters, D.O.  Internal Medicine Resident, PGY-3 [facility]  Pager: [phone] 9:16 AM, 03/28/2023     NEUROHOSPITALIST ADDENDUM Performed a face to face diagnostic evaluation.   I have reviewed the contents of history and physical exam as documented by PA/ARNP/Resident and agree with above documentation.  I have discussed and formulated the above plan as documented. Edits to the note have been made as needed.  Impression/Key exam findings/Plan: he is back to his baseline and does miss about 3-4 doses of keppra a week. He just forgets to take it. He is not experiencing side effects from Keppra. Does reassure methat he will set up alarms and talk to his friend Crystal to remind him to take his Keppra. Since he had seizure in the setting of missing doses of Keppra, for now would aim to increase compliance and not make changes to his home Keppra dosing.  Will get MRI since INR is subtherapeutic while he is supposed to be on Warfarin. PCCM team switching him to eliquis.  If MRI brain is negative for stroke, no further workup.  We will signoff. Will just follow up on MRI but do not plan to see him.  Erick Blinks, MD [facility] [phone]   If 7pm to 7am, please call on call as listed on AMION.

## 2023-03-28 NOTE — Progress Notes (Signed)
Pt more agitated this am, refusing this RN to give him his sub Q heparin and demanding that I not touch him. Pt is also verbally abusive at this time taking to this nurse. Vital signs have been hard to get through the night. Pola Corn MD notified for agitation medicine.
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
## 2023-03-28 NOTE — Progress Notes (Signed)
Pt receives out-pt HD at [facility] on MWF. Will assist as needed.   [name] [name] Renal Navigator [phone] 

## 2023-03-28 NOTE — Plan of Care (Signed)
  Problem: Pain Managment: Goal: General experience of comfort will improve Outcome: Progressing   Problem: Safety: Goal: Ability to remain free from injury will improve Outcome: Progressing   

## 2023-03-28 NOTE — Progress Notes (Signed)
[facility] Progress Note  Subjective: seen in room, much more responsive today  Vitals:   03/28/23 1300 03/28/23 1303 03/28/23 1320 03/28/23 1509  BP: (!) 200/109  (!) 163/118 135/89  Pulse: (!) 193  (!) 114 (!) 107  Resp: 16  (!) 22 17  Temp:    99.4 F (37.4 C)  TempSrc:    Axillary  SpO2: 90%  99% 92%  Weight:  98.1 kg    Height:        Exam: Gen - alert, no distress No jvd or bruits Chest clear bilat to bases RRR no MRG Abd soft ntnd no mass or ascites +bs GU defer MS no joint effusions or deformity Ext trace LE edema Neuro is alert, interacting today    RIJ TDC intact      Renal-related home meds: - norvasc 10 - coreg 12.5 bid - sensipar 60mg  hs - eliquis 2.5 bid - midodrine 5mg  tid - renavite - renvela 4 ac tid      OP HD: [facility] MWF  4h   400/800   101.5kg   2/2 bath   RIJ TDC    Heparin none - last OP HD today 10/9, post wt 110.5kg (9kg over) - mircera 50 mcg IV q 2wks, last 10/2, due 10/16  - venofer 100mg  three times per week thru today - rocaltrol 2.0 mcg      CXR 10/09 - IMPRESSION: Cardiomegaly with vascular congestion and diffuse hazy pulmonary density suspicious for mild pulmonary edema.   Assessment/ Plan: Seizures - acute on chronic, takes Keppra at home ESRD - on HD MWF. Had full HD on day of admission. Got extra HD this am for volume overload/ pulm edema/ O2 requirement. HD tomorrow to get back on schedule.  HTN/ BP - BP's a bit high here, coming down w/ meds and HD Volume - CXR admit showed bilat pulm edema. Got off 4 L w/ HD this am. Will get f/u CXR.  Anemia esrd - Hb 11- 13, no esa needs. Next esa due 10/16.  MBD ckd - CCa in range, add on phos. Resume binder, sensipar and po vdra when pt is eating/ taking pills.          Vinson Moselle MD  [facility] 03/28/2023, 5:06 PM  Recent Labs  Lab 03/27/23 1329 03/27/23 1340 03/27/23 1404 03/28/23 0316  HGB 11.5* 13.3 11.6* 11.0*  ALBUMIN 3.9  --   --   --   CALCIUM 9.6  --    --  10.0  CREATININE 11.18* 12.10*  --  13.59*  K 3.7 3.7 4.0 4.4   No results for input(s): "IRON", "TIBC", "FERRITIN" in the last 168 hours. Inpatient medications:  Chlorhexidine Gluconate Cloth  6 each Topical Q0600   heparin  5,000 Units Subcutaneous Q8H   insulin aspart  0-6 Units Subcutaneous Q4H   levETIRAcetam  500 mg Oral BID    anticoagulant sodium citrate     alteplase, anticoagulant sodium citrate, docusate sodium, feeding supplement (NEPRO CARB STEADY), heparin, hydrALAZINE, lidocaine (PF), lidocaine-prilocaine, LORazepam, OLANZapine, pentafluoroprop-tetrafluoroeth, polyethylene glycol

## 2023-03-28 NOTE — Procedures (Signed)
Patient Name: [name] [name]  MRN: [id number]  Epilepsy Attending: Charlsie Quest  Referring Physician/Provider: Gordy Councilman, MD  Date: 03/28/2023 Duration: 26.53 mins  Patient history:  42 y.o. male with PMH of ESRD on HD MWF, antiphospholipid syndrome prescribed warfarin, multiple infarcts (most recent left PCA 01/2023), seizure, HTN with recent admission for respiratory failure requiring intubation in the setting of seizure and aspiration pneumonitis who presented from HD with abnormal behavior including not wearing a shirt to HD center. In the ED, he became hypoxic and had witnessed seizure. Was given IV ativan and keppra load. EEG to evaluate for seizure  Level of alertness: Awake  AEDs during EEG study: LEV  Technical aspects: This EEG study was done with scalp electrodes positioned according to the 10-20 International system of electrode placement. Electrical activity was reviewed with band pass filter of 1-70Hz , sensitivity of 7 uV/mm, display speed of 59mm/sec with a 60Hz  notched filter applied as appropriate. EEG data were recorded continuously and digitally stored.  Video monitoring was available and reviewed as appropriate.  Description: No posterior dominant rhythm was seen. EEG showed continuous generalized polymorphic sharply contoured high amplitude 3 to 5 Hz theta-delta slowing. Hyperventilation and photic stimulation were not performed.     ABNORMALITY - Continuous slow, generalized  IMPRESSION: This study is suggestive of moderate to severe diffuse encephalopathy. No seizures or epileptiform discharges were seen throughout the recording.  Pankaj Haack Annabelle Harman

## 2023-03-28 NOTE — TOC Benefit Eligibility Note (Signed)
Patient Product/process development scientist completed.    The patient is insured through [employer].     Ran test claim for Eliquis 5 mg and the current 30 day co-pay is $4.00.   This test claim was processed through [facility]- copay amounts may vary at other pharmacies due to pharmacy/plan contracts, or as the patient moves through the different stages of their insurance plan.     Roland Earl, CPHT Pharmacy Technician III Certified Patient Advocate [facility] Patient Advocate Team Direct Number: [phone]  Fax: [phone]

## 2023-03-29 DIAGNOSIS — R569 Unspecified convulsions: Secondary | ICD-10-CM | POA: Diagnosis not present

## 2023-03-29 LAB — RENAL FUNCTION PANEL
Albumin: 3.7 g/dL (ref 3.5–5.0)
Anion gap: 22 — ABNORMAL HIGH (ref 5–15)
BUN: 74 mg/dL — ABNORMAL HIGH (ref 6–20)
CO2: 24 mmol/L (ref 22–32)
Calcium: 10.5 mg/dL — ABNORMAL HIGH (ref 8.9–10.3)
Chloride: 91 mmol/L — ABNORMAL LOW (ref 98–111)
Creatinine, Ser: 18.08 mg/dL — ABNORMAL HIGH (ref 0.61–1.24)
GFR, Estimated: 3 mL/min — ABNORMAL LOW (ref >60)
Glucose, Bld: 111 mg/dL — ABNORMAL HIGH (ref 70–99)
Phosphorus: 11.1 mg/dL — ABNORMAL HIGH (ref 2.5–4.6)
Potassium: 6.1 mmol/L — ABNORMAL HIGH (ref 3.5–5.1)
Sodium: 137 mmol/L (ref 135–145)

## 2023-03-29 LAB — GLUCOSE, CAPILLARY
Glucose-Capillary: 103 mg/dL — ABNORMAL HIGH (ref 70–99)
Glucose-Capillary: 103 mg/dL — ABNORMAL HIGH (ref 70–99)
Glucose-Capillary: 107 mg/dL — ABNORMAL HIGH (ref 70–99)
Glucose-Capillary: 99 mg/dL (ref 70–99)
Glucose-Capillary: 104 mg/dL — ABNORMAL HIGH (ref 70–99)

## 2023-03-29 LAB — CBC
HCT: 39.3 % (ref 39.0–52.0)
Hemoglobin: 12.6 g/dL — ABNORMAL LOW (ref 13.0–17.0)
MCH: 30 pg (ref 26.0–34.0)
MCHC: 32.1 g/dL (ref 30.0–36.0)
MCV: 93.6 fL (ref 80.0–100.0)
Platelets: 131 10*3/uL — ABNORMAL LOW (ref 150–400)
RBC: 4.2 MIL/uL — ABNORMAL LOW (ref 4.22–5.81)
RDW: 14.2 % (ref 11.5–15.5)
WBC: 13.6 10*3/uL — ABNORMAL HIGH (ref 4.0–10.5)
nRBC: 0 % (ref 0.0–0.2)

## 2023-03-29 LAB — HEPATITIS B SURFACE ANTIBODY, QUANTITATIVE: Hep B S AB Quant (Post): 4120 m[IU]/mL

## 2023-03-29 MED ORDER — LIDOCAINE HCL (PF) 1 % IJ SOLN: 5.0000 mL | INTRAMUSCULAR | Status: DC | PRN

## 2023-03-29 MED ORDER — ANTICOAGULANT SODIUM CITRATE 4% (200MG/5ML) IV SOLN: 5.0000 mL | Status: DC | PRN

## 2023-03-29 MED ORDER — HEPARIN SODIUM (PORCINE) 1000 UNIT/ML DIALYSIS: 1000.0000 [IU] | INTRAMUSCULAR | Status: DC | PRN

## 2023-03-29 MED ORDER — HEPARIN SODIUM (PORCINE) 1000 UNIT/ML IJ SOLN
3200.0000 [IU] | Freq: Once | INTRAMUSCULAR | Status: AC
  Administered 2023-03-29: 3200 [IU]
  Filled 2023-03-29: qty 4

## 2023-03-29 MED ORDER — ALTEPLASE 2 MG IJ SOLR: 2.0000 mg | Freq: Once | INTRAMUSCULAR | Status: DC | PRN

## 2023-03-29 MED ORDER — ACETAMINOPHEN 325 MG PO TABS
650.0000 mg | ORAL_TABLET | Freq: Four times a day (QID) | ORAL | Status: DC | PRN
  Administered 2023-04-02 – 2023-04-20 (×3): 650 mg via ORAL
  Filled 2023-03-29 (×4): qty 2

## 2023-03-29 MED ORDER — ACETAMINOPHEN 325 MG PO TABS
650.0000 mg | ORAL_TABLET | Freq: Once | ORAL | Status: AC
  Administered 2023-03-29: 650 mg via ORAL
  Filled 2023-03-29: qty 2

## 2023-03-29 MED ORDER — LIDOCAINE-PRILOCAINE 2.5-2.5 % EX CREA: 1.0000 | TOPICAL_CREAM | CUTANEOUS | Status: DC | PRN

## 2023-03-29 MED ORDER — NEPRO/CARBSTEADY PO LIQD: 237.0000 mL | ORAL | Status: DC | PRN

## 2023-03-29 MED ORDER — PENTAFLUOROPROP-TETRAFLUOROETH EX AERO: 1.0000 | INHALATION_SPRAY | CUTANEOUS | Status: DC | PRN

## 2023-03-29 NOTE — Progress Notes (Signed)
PROGRESS NOTE    [name] [name]  WUJ:[id number] DOB: February 28, 1981 DOA: 03/27/2023 PCP: Claiborne Rigg, NP   Brief Narrative:  This 42 years old male with PMH significant for essential hypertension, ESRD on hemodialysis, SLE with APLA previously on Eliquis, CVA, chronic thrombocytopenia, anemia of chronic disease, history of seizures, history of medication noncompliance presented to the ED from dialysis for not acting himself during dialysis.  Patient was found to have active seizures in the ER with tongue biting and was hypoxic with SpO2 of 60% on room air which improved to 100% on nonrebreather and was treated with IV Ativan.  Patient remained postictal in the ER occasionally agitated.  He was loaded with Keppra.  Neurology was consulted.  CT head without acute bleed but possibly shows new stroke.  Patient had multiple ER visits for breakthrough seizures.  MRI showed acute infarct of the left PCA territory, no hemorrhage or mass effect.  Patient was admitted in the ICU. TRH pickup 03/29/2023  Assessment & Plan:   Principal Problem:   Seizure (HCC)  Seizure with postictal encephalopathy: Acute left PCA infarct: History of stroke secondary to APLA syndrome Patient is awake and fully oriented. No further seizure activity, EEG w/o seizure activity.  Continue Keppra 500 BID Continue Ativan prn for breakthrough seizure MRI shows left PCA infarct. Continue Olanzapine prn for agitation Passed bedside swallow eval.  Started on renal diet. Continue Sz precautions. Neurology consult appreciated. Patient does miss about 3-4 doses of Keppra a week. Will reassure that he will set up alarm or talk to his friend to remind him to take his Keppra.  Neurologist recommended to increase compliance not to make changes in the Keppra dosing.   Acute hypoxic respiratory failure: Likely multifactorial. Currently on 5L [place]. Wean O2 as tolerated Continue hemodialysis as per nephrology.   ESRD on MWF  HD Nephrology consulted, appreciate recs.  Monitor intake output, daily weight.   Essential hypertension: Continue hydralazine as needed. Hoping blood pressure will improve with further hemodialysis. If BP consistently elevated after HD, consider restarting Coreg.   Antiphospholipid Syndrome  SLE  Poor Med Adherence: Pt has hx. of antiphospholipid syndrome, leading to hypercoagulable state and causing hx of strokes.  Patient has tried multiple anticoagulants including warfarin, eliquis, xarelto, but struggles w/ med adherence.  Looks like he was most recently taking Warfarin, but INR on admission suggests pt was not taking regularly. Although warfarin is first line for antiphopsolipid syndrome, given pt has poor follow-up and poor med adherence, Eliquis may be a better option.  Will await MRI and neuro input before restarting oral anticoag, cont SQ hep for now.    Anemia of chronic disease: Chronic thrombocytopenia  Leukocytosis Pt has leukocytosis, which is downtrending since admission.  Could be reactive 2/2 seizure or aspiration pneumonitis.  He remains afebrile and no signs of infxn at this time, will hold antibiotics and trend CBC. - trend CBC, transfuse for Hgb < 7   DVT prophylaxis:  Heparin Code Status: Full code Family Communication:No family at bed side. Disposition Plan:    Status is: Inpatient Remains inpatient appropriate because: Admitted for stroke and seizure disorder.   Consultants:  PCCM Neurology  Procedures: MRI Brain  Antimicrobials:  Anti-infectives (From admission, onward)    Start     Dose/Rate Route Frequency Ordered Stop   03/27/23 1545  cefTRIAXone (ROCEPHIN) 1 g in sodium chloride 0.9 % 100 mL IVPB        1 g 200 mL/hr over  30 Minutes Intravenous  Once 03/27/23 1544 03/27/23 1740   03/27/23 1545  azithromycin (ZITHROMAX) 500 mg in sodium chloride 0.9 % 250 mL IVPB  Status:  Discontinued        500 mg 250 mL/hr over 60 Minutes Intravenous   Once 03/27/23 1544 03/27/23 1727      Subjective: Patient was seen and examined at bedside.  Overnight events noted.   Patient reports doing much better. Patient seems agitated and restless with the MRI.  Objective: Vitals:   03/29/23 1257 03/29/23 1307 03/29/23 1342 03/29/23 1400  BP:  130/84 132/84 (!) 136/92  Pulse:  98 (!) 101 95  Resp:  18 (!) 24 17  Temp:  100 F (37.8 C)    TempSrc:  Oral    SpO2:  92% 94% 97%  Weight: 105.1 kg     Height:       No intake or output data in the 24 hours ending 03/29/23 1408 Filed Weights   03/28/23 1303 03/29/23 0500 03/29/23 1257  Weight: 98.1 kg 106.5 kg 105.1 kg    Examination:  General exam: Appears calm and comfortable, deconditioned, not in any acute distress. Respiratory system: Clear to auscultation. Respiratory effort normal.  RR 15 Cardiovascular system: S1 & S2 heard, RRR. No murmur.  No pedal edema. Gastrointestinal system: Abdomen is non distended, soft and non tender.  Normal bowel sounds heard. Central nervous system: Alert and oriented x 3. No focal neurological deficits. Extremities: No edema, no cyanosis, no clubbing Skin: No rashes, lesions or ulcers Psychiatry: Judgement and insight appear normal. Mood & affect appropriate.     Data Reviewed: I have personally reviewed following labs and imaging studies  CBC: Recent Labs  Lab 03/27/23 1329 03/27/23 1340 03/27/23 1404 03/28/23 0316 03/29/23 0954  WBC 13.4*  --   --  11.6* 13.6*  NEUTROABS 9.4*  --   --   --   --   HGB 11.5* 13.3 11.6* 11.0* 12.6*  HCT 37.0* 39.0 34.0* 33.5* 39.3  MCV 100.3*  --   --  94.4 93.6  PLT 125*  --   --  114* 131*   Basic Metabolic Panel: Recent Labs  Lab 03/27/23 1329 03/27/23 1340 03/27/23 1404 03/28/23 0316  NA 141 138 137 137  K 3.7 3.7 4.0 4.4  CL 95* 99  --  95*  CO2 16*  --   --  26  GLUCOSE 133* 133*  --  102*  BUN 25* 27*  --  33*  CREATININE 11.18* 12.10*  --  13.59*  CALCIUM 9.6  --   --  10.0  MG   --   --   --  2.4   GFR: Estimated Creatinine Clearance: 8.9 mL/min (A) (by C-G formula based on SCr of 13.59 mg/dL (H)). Liver Function Tests: Recent Labs  Lab 03/27/23 1329  AST 23  ALT 12  ALKPHOS 89  BILITOT 1.0  PROT 9.0*  ALBUMIN 3.9   No results for input(s): "LIPASE", "AMYLASE" in the last 168 hours. No results for input(s): "AMMONIA" in the last 168 hours. Coagulation Profile: Recent Labs  Lab 03/27/23 1329  INR 1.1   Cardiac Enzymes: No results for input(s): "CKTOTAL", "CKMB", "CKMBINDEX", "TROPONINI" in the last 168 hours. BNP (last 3 results) No results for input(s): "PROBNP" in the last 8760 hours. HbA1C: No results for input(s): "HGBA1C" in the last 72 hours. CBG: Recent Labs  Lab 03/28/23 1940 03/28/23 2324 03/29/23 0335 03/29/23 0830 03/29/23 1206  GLUCAP 110* 104* 103* 103* 107*   Lipid Profile: No results for input(s): "CHOL", "HDL", "LDLCALC", "TRIG", "CHOLHDL", "LDLDIRECT" in the last 72 hours. Thyroid Function Tests: No results for input(s): "TSH", "T4TOTAL", "FREET4", "T3FREE", "THYROIDAB" in the last 72 hours. Anemia Panel: No results for input(s): "VITAMINB12", "FOLATE", "FERRITIN", "TIBC", "IRON", "RETICCTPCT" in the last 72 hours. Sepsis Labs: No results for input(s): "PROCALCITON", "LATICACIDVEN" in the last 168 hours.  No results found for this or any previous visit (from the past 240 hour(s)).       Radiology Studies: DG CHEST PORT 1 VIEW  Result Date: 03/28/2023 CLINICAL DATA:  Altered mental status EXAM: PORTABLE CHEST 1 VIEW COMPARISON:  03/27/2023 FINDINGS: Right-sided central venous catheter tip at the right atrium. Cardiomegaly with vascular congestion. Partially visualized left upper extremity metallic stent, present on multiple prior exams including chest radiographs performed prior to an MRI that was done in February of 2024. Negative for unexpected metallic density foreign body IMPRESSION: Cardiomegaly with vascular  congestion. Electronically Signed   By: Jasmine Pang M.D.   On: 03/28/2023 20:00   DG Abd 1 View  Result Date: 03/28/2023 CLINICAL DATA:  Altered mental status EXAM: ABDOMEN - 1 VIEW COMPARISON:  11/08/2022 FINDINGS: Nonobstructed gas pattern. Presumed support wires over the upper and mid abdomen. No unexpected metallic density foreign body is seen IMPRESSION: Nonobstructed gas pattern. Negative for unexpected metallic density foreign body Electronically Signed   By: Jasmine Pang M.D.   On: 03/28/2023 19:58   EEG adult  Result Date: 03/28/2023 Charlsie Quest, MD     03/28/2023  7:08 AM Patient Name: [name] [name] MRN: [id number] Epilepsy Attending: Charlsie Quest Referring Physician/Provider: Gordy Councilman, MD Date: 03/28/2023 Duration: 26.53 mins Patient history:  42 y.o. male with PMH of ESRD on HD MWF, antiphospholipid syndrome prescribed warfarin, multiple infarcts (most recent left PCA 01/2023), seizure, HTN with recent admission for respiratory failure requiring intubation in the setting of seizure and aspiration pneumonitis who presented from HD with abnormal behavior including not wearing a shirt to HD center. In the ED, he became hypoxic and had witnessed seizure. Was given IV ativan and keppra load. EEG to evaluate for seizure Level of alertness: Awake AEDs during EEG study: LEV Technical aspects: This EEG study was done with scalp electrodes positioned according to the 10-20 International system of electrode placement. Electrical activity was reviewed with band pass filter of 1-70Hz , sensitivity of 7 uV/mm, display speed of 10mm/sec with a 60Hz  notched filter applied as appropriate. EEG data were recorded continuously and digitally stored.  Video monitoring was available and reviewed as appropriate. Description: No posterior dominant rhythm was seen. EEG showed continuous generalized polymorphic sharply contoured high amplitude 3 to 5 Hz theta-delta slowing. Hyperventilation and photic  stimulation were not performed.   ABNORMALITY - Continuous slow, generalized IMPRESSION: This study is suggestive of moderate to severe diffuse encephalopathy. No seizures or epileptiform discharges were seen throughout the recording. Priyanka Annabelle Harman   CT HEAD WO CONTRAST ( )  Result Date: 03/27/2023 CLINICAL DATA:  Mental status change EXAM: CT HEAD WITHOUT CONTRAST TECHNIQUE: Contiguous axial images were obtained from the base of the skull through the vertex without intravenous contrast. RADIATION DOSE REDUCTION: This exam was performed according to the departmental dose-optimization program which includes automated exposure control, adjustment of the mA and/or kV according to patient size and/or use of iterative reconstruction technique. COMPARISON:  CT brain 01/02/2023, 12/29/2022, 11/08/2022, MRI 08/10/2022 FINDINGS: Brain: Negative for hemorrhage, mass  effect or midline shift. Atrophy and advanced chronic small vessel ischemic changes of the white matter. Chronic left parietal infarct with encephalomalacia. Interval left posterior parieto-occipital infarct, series 2, image 24 which appears more subacute to chronic but is new from CT in July. Multiple small chronic infarcts in the left cerebellum, though with suspected interval left cerebellar hypo density on series 2, image 12. Vascular: No hyperdense vessels.  No unexpected calcification Skull: Normal. Negative for fracture or focal lesion. Sinuses/Orbits: Mild mucosal sinus changes Other: None IMPRESSION: 1. Negative for acute intracranial hemorrhage. 2. Atrophy and advanced chronic small vessel ischemic changes of the white matter. Chronic left parietal infarct. Interval left posterior parieto-occipital infarct which appears more subacute to chronic in nature but is new from CT in July. Multiple small chronic infarcts in the left cerebellum, though with suspected interval left cerebellar hypo density, raising concern for possible acute or subacute  infarct. Consider further assessment with MRI. Electronically Signed   By: Jasmine Pang M.D.   On: 03/27/2023 15:35    Scheduled Meds:  Chlorhexidine Gluconate Cloth  6 each Topical Q0600   heparin  5,000 Units Subcutaneous Q8H   heparin sodium (porcine)  3,200 Units Intracatheter Once   insulin aspart  0-6 Units Subcutaneous Q4H   levETIRAcetam  500 mg Oral BID   Continuous Infusions:  anticoagulant sodium citrate       LOS: 2 days    Time spent: 50 mins    Willeen Niece, MD [facility]   If 7PM-7AM, please contact night-coverage

## 2023-03-29 NOTE — Progress Notes (Signed)
[facility] Progress Note  Subjective: seen in room. No c/o's.   Vitals:   03/29/23 1257 03/29/23 1307 03/29/23 1342 03/29/23 1400  BP:  130/84 132/84 (!) 136/92  Pulse:  98 (!) 101 95  Resp:  18 (!) 24 17  Temp:  100 F (37.8 C)    TempSrc:  Oral    SpO2:  92% 94% 97%  Weight: 105.1 kg     Height:        Exam: Gen - alert, no distress No jvd or bruits Chest clear bilat to bases RRR no MRG Abd soft ntnd no mass or ascites +bs GU defer MS no joint effusions or deformity Ext trace LE edema Neuro is alert, interacting today    RIJ TDC intact      Renal-related home meds: - norvasc 10 - coreg 12.5 bid - sensipar 60mg  hs - eliquis 2.5 bid - midodrine 5mg  tid - renavite - renvela 4 ac tid      OP HD: [facility] MWF  4h   400/800   101.5kg   2/2 bath   RIJ TDC    Heparin none - last OP HD today 10/9, post wt 110.5kg (9kg over) - mircera 50 mcg IV q 2wks, last 10/2, due 10/16  - venofer 100mg  three times per week thru today - rocaltrol 2.0 mcg      CXR 10/09 - IMPRESSION: Cardiomegaly with vascular congestion and diffuse hazy pulmonary density suspicious for mild pulmonary edema.   Assessment/ Plan: Seizures - acute on chronic, takes Keppra at home Acute CVA - L PCA infarct Antiphospholipid syndrome - hx SLE, has been tried on multiple anticoagulants in the past but struggles w/ medication adherence.  ESRD - on HD MWF. Had full HD on day of admission. Had extra HD yesterday for volume overload. HD today to get back on schedule.  HTN/ BP - BP's high on admission, normal now.  Volume - CXR admit showed bilat pulm edema. 4 L off w/ HD yesterday. 3 L goal w/ HD today.  Anemia esrd - Hb 11- 13, no esa needs. Next esa due 10/16.  MBD ckd - CCa in range, add on phos. Resume binder, sensipar and po vdra when eating/ taking pills.   Vinson Moselle MD  [facility] 03/29/2023, 2:01 PM  Recent Labs  Lab 03/27/23 1329 03/27/23 1340 03/27/23 1404 03/28/23 0316  HGB 11.5*  13.3 11.6* 11.0*  ALBUMIN 3.9  --   --   --   CALCIUM 9.6  --   --  10.0  CREATININE 11.18* 12.10*  --  13.59*  K 3.7 3.7 4.0 4.4   No results for input(s): "IRON", "TIBC", "FERRITIN" in the last 168 hours. Inpatient medications:  Chlorhexidine Gluconate Cloth  6 each Topical Q0600   heparin  5,000 Units Subcutaneous Q8H   heparin sodium (porcine)  3,200 Units Intracatheter Once   insulin aspart  0-6 Units Subcutaneous Q4H   levETIRAcetam  500 mg Oral BID    anticoagulant sodium citrate     alteplase, anticoagulant sodium citrate, docusate sodium, feeding supplement (NEPRO CARB STEADY), feeding supplement (NEPRO CARB STEADY), heparin, heparin, hydrALAZINE, lidocaine (PF), lidocaine-prilocaine, LORazepam, OLANZapine, pentafluoroprop-tetrafluoroeth, polyethylene glycol

## 2023-03-29 NOTE — Progress Notes (Signed)
Received patient in bed to unit.  Alert and oriented.  Informed consent signed and in chart.   TX duration: 1.7 hours  Patient did tolerated well. Patient had low blood pressures, and called a rapid response for advise after patient had possible seizure activity.  Dr. Arlean Hopping made aware and session was terminated by Dr. Arlean Hopping due to low blood pressure and possible seizure.  Internist Dr. Idelle Leech informed. Transported back to the room  Alert, without acute distress.  Hand-off given to patient's nurse.   Access used: Right HD cath Access issues: Reversed: Arterial to Venous and Venous to Arterial.  Total UF removed: 0 Medication(s) given: Tylenol for fever.   Stacie Glaze LPN [facility]  03/29/23 1553  Vitals  Temp 99.8 F (37.7 C)  Temp Source Axillary  BP (!) 103/52  MAP (mmHg) 67  BP Location Right Leg  BP Method Automatic  Patient Position (if appropriate) Lying  Pulse Rate 95  Pulse Rate Source Monitor  ECG Heart Rate 97  Resp 14  Oxygen Therapy  SpO2 94 %  O2 Device Nasal Cannula  O2 Flow Rate (L/min) 6 L/min  During Treatment Monitoring  HD Safety Checks Performed Yes  Intra-Hemodialysis Comments  (Dr. Arta Silence terminated session due to patient's low BP and possible seizure activity, see note.)  Dialysis Fluid Bolus Normal Saline  Bolus Amount (mL) 300 mL  Hemodialysis Catheter Right Internal jugular Double lumen Permanent (Tunneled)  Placement Date/Time: 07/16/22 1625   Serial / Lot #: [id number]  Expiration Date: 03/21/27  Time Out: Correct patient;Correct site;Correct procedure  Maximum sterile barrier precautions: Hand hygiene;Cap;Mask;Sterile gown;Sterile gloves;Large sterile ...  Site Condition No complications  Interventions  (deaccessed)

## 2023-03-29 NOTE — Progress Notes (Addendum)
RE: possible seizure activity in Dialysis  Patient did not tolerate dialysis today.  Around 1520 patient's BP was low and after trying to fix BP cuff and moving it around.  At about 1530 I informed Dr. Arlean Hopping after I had turned off fluid removal on dialysis machine and put patient in [place] position.  Dr. Arlean Hopping advised to keep patient Systolic over 95.  I asked assistance of charge nurse Bo, RN and after we had changed BP cuff to lower leg and gave 300cc bolus, around [phone] Patient had a possible seizure event.  Patient's eyes were open and he was not responding to voice or sternal rub.  Patient's eyes were shaking.  Patient oxygen saturation was desatting, and because of the possible seizure and oxygen desaturation we called for rapid response.   RN Hele L. Of the Rapid response team came up, and seizure activity stopped, oxygen saturation was better in low 90's and BP back up to 119/98.  Gave a report to [facility] RN and she had nothing further to advise seeing as patient had stabilized.  Dr. Arlean Hopping informed me to end session.  Before we disconnected patient had low BP again, see vitals.  We gave 1000cc bolus through our [facility]-dialysis machine and patient's BP back to 92/64.  Rony Ratz-LPN [facility]

## 2023-03-29 NOTE — Plan of Care (Signed)
Problem: Education: Goal: Knowledge of disease or condition will improve 03/29/2023 1156 by Elba Barman, RN Outcome: Progressing 03/29/2023 1148 by Elba Barman, RN Outcome: Progressing Goal: Knowledge of secondary prevention will improve (MUST DOCUMENT ALL) 03/29/2023 1156 by Elba Barman, RN Outcome: Progressing 03/29/2023 1148 by Elba Barman, RN Outcome: Progressing Goal: Knowledge of patient specific risk factors will improve Loraine Leriche N/A or DELETE if not current risk factor) 03/29/2023 1156 by Elba Barman, RN Outcome: Progressing 03/29/2023 1148 by Elba Barman, RN Outcome: Progressing   Problem: Ischemic Stroke/TIA Tissue Perfusion: Goal: Complications of ischemic stroke/TIA will be minimized 03/29/2023 1156 by Elba Barman, RN Outcome: Progressing 03/29/2023 1148 by Elba Barman, RN Outcome: Progressing   Problem: Coping: Goal: Will verbalize positive feelings about self 03/29/2023 1156 by Elba Barman, RN Outcome: Progressing 03/29/2023 1148 by Elba Barman, RN Outcome: Progressing Goal: Will identify appropriate support needs 03/29/2023 1156 by Elba Barman, RN Outcome: Progressing 03/29/2023 1148 by Elba Barman, RN Outcome: Progressing   Problem: Health Behavior/Discharge Planning: Goal: Ability to manage health-related needs will improve 03/29/2023 1156 by Elba Barman, RN Outcome: Progressing 03/29/2023 1148 by Elba Barman, RN Outcome: Progressing Goal: Goals will be collaboratively established with patient/family 03/29/2023 1156 by Elba Barman, RN Outcome: Progressing 03/29/2023 1148 by Elba Barman, RN Outcome: Progressing   Problem: Self-Care: Goal: Ability to participate in self-care as condition permits will improve 03/29/2023 1156 by Elba Barman, RN Outcome: Progressing 03/29/2023 1148 by Elba Barman, RN Outcome: Progressing Goal: Verbalization of feelings and concerns over difficulty with self-care  will improve 03/29/2023 1156 by Elba Barman, RN Outcome: Progressing 03/29/2023 1148 by Elba Barman, RN Outcome: Progressing Goal: Ability to communicate needs accurately will improve 03/29/2023 1156 by Elba Barman, RN Outcome: Progressing 03/29/2023 1148 by Elba Barman, RN Outcome: Progressing   Problem: Nutrition: Goal: Risk of aspiration will decrease 03/29/2023 1156 by Elba Barman, RN Outcome: Progressing 03/29/2023 1148 by Elba Barman, RN Outcome: Progressing Goal: Dietary intake will improve 03/29/2023 1156 by Elba Barman, RN Outcome: Progressing 03/29/2023 1148 by Elba Barman, RN Outcome: Progressing   Problem: Education: Goal: Knowledge of General Education information will improve Description: Including pain rating scale, medication(s)/side effects and non-pharmacologic comfort measures 03/29/2023 1156 by Elba Barman, RN Outcome: Progressing 03/29/2023 1148 by Elba Barman, RN Outcome: Progressing   Problem: Health Behavior/Discharge Planning: Goal: Ability to manage health-related needs will improve 03/29/2023 1156 by Elba Barman, RN Outcome: Progressing 03/29/2023 1148 by Elba Barman, RN Outcome: Progressing   Problem: Clinical Measurements: Goal: Ability to maintain clinical measurements within normal limits will improve 03/29/2023 1156 by Elba Barman, RN Outcome: Progressing 03/29/2023 1148 by Elba Barman, RN Outcome: Progressing Goal: Will remain free from infection 03/29/2023 1156 by Elba Barman, RN Outcome: Progressing 03/29/2023 1148 by Elba Barman, RN Outcome: Progressing Goal: Diagnostic test results will improve 03/29/2023 1156 by Elba Barman, RN Outcome: Progressing 03/29/2023 1148 by Elba Barman, RN Outcome: Progressing Goal: Respiratory complications will improve 03/29/2023 1156 by Elba Barman, RN Outcome: Progressing 03/29/2023 1148 by Elba Barman, RN Outcome:  Progressing Goal: Cardiovascular complication will be avoided 03/29/2023 1156 by Elba Barman, RN Outcome: Progressing 03/29/2023 1148 by Elba Barman, RN Outcome: Progressing   Problem: Activity: Goal: Risk for activity intolerance will decrease 03/29/2023 1156 by Elba Barman, RN Outcome: Progressing  03/29/2023 1148 by Elba Barman, RN Outcome: Progressing   Problem: Nutrition: Goal: Adequate nutrition will be maintained 03/29/2023 1156 by Elba Barman, RN Outcome: Progressing 03/29/2023 1148 by Elba Barman, RN Outcome: Progressing   Problem: Coping: Goal: Level of anxiety will decrease 03/29/2023 1156 by Elba Barman, RN Outcome: Progressing 03/29/2023 1148 by Elba Barman, RN Outcome: Progressing   Problem: Elimination: Goal: Will not experience complications related to bowel motility 03/29/2023 1156 by Elba Barman, RN Outcome: Progressing 03/29/2023 1148 by Elba Barman, RN Outcome: Progressing Goal: Will not experience complications related to urinary retention 03/29/2023 1156 by Elba Barman, RN Outcome: Progressing 03/29/2023 1148 by Elba Barman, RN Outcome: Progressing   Problem: Pain Managment: Goal: General experience of comfort will improve 03/29/2023 1156 by Elba Barman, RN Outcome: Progressing 03/29/2023 1148 by Elba Barman, RN Outcome: Progressing   Problem: Safety: Goal: Ability to remain free from injury will improve 03/29/2023 1156 by Elba Barman, RN Outcome: Progressing 03/29/2023 1148 by Elba Barman, RN Outcome: Progressing   Problem: Skin Integrity: Goal: Risk for impaired skin integrity will decrease 03/29/2023 1156 by Elba Barman, RN Outcome: Progressing 03/29/2023 1148 by Elba Barman, RN Outcome: Progressing

## 2023-03-30 ENCOUNTER — Inpatient Hospital Stay (HOSPITAL_COMMUNITY): Payer: 59

## 2023-03-30 DIAGNOSIS — R569 Unspecified convulsions: Secondary | ICD-10-CM | POA: Diagnosis not present

## 2023-03-30 LAB — GLUCOSE, CAPILLARY
Glucose-Capillary: 102 mg/dL — ABNORMAL HIGH (ref 70–99)
Glucose-Capillary: 116 mg/dL — ABNORMAL HIGH (ref 70–99)
Glucose-Capillary: 75 mg/dL (ref 70–99)
Glucose-Capillary: 92 mg/dL (ref 70–99)
Glucose-Capillary: 95 mg/dL (ref 70–99)
Glucose-Capillary: 95 mg/dL (ref 70–99)
Glucose-Capillary: 99 mg/dL (ref 70–99)

## 2023-03-30 LAB — CBC
HCT: 38.2 % — ABNORMAL LOW (ref 39.0–52.0)
Hemoglobin: 12.2 g/dL — ABNORMAL LOW (ref 13.0–17.0)
MCH: 29.7 pg (ref 26.0–34.0)
MCHC: 31.9 g/dL (ref 30.0–36.0)
MCV: 92.9 fL (ref 80.0–100.0)
Platelets: 130 10*3/uL — ABNORMAL LOW (ref 150–400)
RBC: 4.11 MIL/uL — ABNORMAL LOW (ref 4.22–5.81)
RDW: 14.1 % (ref 11.5–15.5)
WBC: 16.1 10*3/uL — ABNORMAL HIGH (ref 4.0–10.5)
nRBC: 0 % (ref 0.0–0.2)

## 2023-03-30 LAB — BASIC METABOLIC PANEL
Anion gap: 21 — ABNORMAL HIGH (ref 5–15)
BUN: 69 mg/dL — ABNORMAL HIGH (ref 6–20)
CO2: 21 mmol/L — ABNORMAL LOW (ref 22–32)
Calcium: 10.6 mg/dL — ABNORMAL HIGH (ref 8.9–10.3)
Chloride: 94 mmol/L — ABNORMAL LOW (ref 98–111)
Creatinine, Ser: 15.4 mg/dL — ABNORMAL HIGH (ref 0.61–1.24)
GFR, Estimated: 4 mL/min — ABNORMAL LOW (ref >60)
Glucose, Bld: 109 mg/dL — ABNORMAL HIGH (ref 70–99)
Potassium: 5.4 mmol/L — ABNORMAL HIGH (ref 3.5–5.1)
Sodium: 136 mmol/L (ref 135–145)

## 2023-03-30 LAB — PHOSPHORUS: Phosphorus: 9.9 mg/dL — ABNORMAL HIGH (ref 2.5–4.6)

## 2023-03-30 LAB — MAGNESIUM: Magnesium: 2.7 mg/dL — ABNORMAL HIGH (ref 1.7–2.4)

## 2023-03-30 MED ORDER — SODIUM CHLORIDE 0.9 % IV BOLUS
500.0000 mL | Freq: Once | INTRAVENOUS | Status: AC
  Administered 2023-03-30: 500 mL via INTRAVENOUS

## 2023-03-30 MED ORDER — ONDANSETRON HCL 4 MG/2ML IJ SOLN
4.0000 mg | Freq: Four times a day (QID) | INTRAMUSCULAR | Status: DC | PRN
  Administered 2023-03-30: 4 mg via INTRAVENOUS
  Filled 2023-03-30: qty 2

## 2023-03-30 NOTE — Progress Notes (Addendum)
  Patient nurse reported patient vomited x 2 greenish colored vomiting.  Per chart review x-ray abdomen from 10/10 showed nonobstructive gas pattern.  Patient has been admitted for management for seizure and acute left PCA infarction. Patient denies any fever and abdominal pain.  He has 1 episodes of loose stool. -Ordered Zofran as needed.   Tereasa Coop, MD [facility] 03/30/2023, 12:40 AM

## 2023-03-30 NOTE — Progress Notes (Signed)
[facility] Progress Note  Subjective: seen in room. Following commands today. BP's soft.   Vitals:   03/30/23 0929 03/30/23 1115 03/30/23 1207 03/30/23 1637  BP: (!) 74/56 (!) 116/58 104/61 125/65  Pulse: 97 92 86 83  Resp: 20 18 17 17   Temp: 98.1 F (36.7 C) 98.8 F (37.1 C) 98.7 F (37.1 C) 98.7 F (37.1 C)  TempSrc:  Oral Oral Oral  SpO2: 97% 98% 95% 100%  Weight:      Height:        Exam: Gen more alert today, responsive No jvd or bruits Chest clear bilat to bases RRR no MRG Abd soft ntnd no mass or ascites +bs Ext no  LE edema Neuro as above    RIJ TDC intact      Renal-related home meds: - norvasc 10 - coreg 12.5 bid - sensipar 60mg  hs - eliquis 2.5 bid - midodrine 5mg  tid - renavite - renvela 4 ac tid      OP HD: [facility] MWF  4h   400/800   101.5kg   2/2 bath   RIJ TDC    Heparin none - last OP HD today 10/9, post wt 110.5kg (9kg over) - mircera 50 mcg IV q 2wks, last 10/2, due 10/16  - venofer 100mg  three times per week thru today - rocaltrol 2.0 mcg      CXR 10/09 - IMPRESSION: Cardiomegaly with vascular congestion and diffuse hazy pulmonary density suspicious for mild pulmonary edema.   Assessment/ Plan: Seizures - acute on chronic, takes Keppra at home. Getting keppra here. Awaiting MRI results. Pt still sometimes agitated. H/o antiphospholipid syndrome - hx SLE, has been tried on multiple anticoagulants in the past but struggles w/ medication adherence.  ESRD - on HD MWF. Had full OP HD on day of admission. Had extra HD yesterday/ Thursday and regular HD Friday. Next HD Monday.  HTN/ BP - BP's high on admission, normal now.  Volume - CXR admit showed edema. 4 L off w/ HD Thursday, and no UF on Friday due to low BP's. Wt's are not accurate.  Anemia esrd - Hb 11- 13, no esa needs. Next esa due 10/16.  MBD ckd - CCa is high, and phos is very high. Resume binder and sensipar when eating (not yet). Holding po vdra due to [employer].   Vinson Moselle MD  [facility] 03/30/2023, 5:19 PM  Recent Labs  Lab 03/27/23 1329 03/27/23 1340 03/29/23 0954 03/29/23 1327 03/30/23 0437  HGB 11.5*   < > 12.6*  --  12.2*  ALBUMIN 3.9  --   --  3.7  --   CALCIUM 9.6   < >  --  10.5* 10.6*  PHOS  --   --   --  11.1* 9.9*  CREATININE 11.18*   < >  --  18.08* 15.40*  K 3.7   < >  --  6.1* 5.4*   < > = values in this interval not displayed.   No results for input(s): "IRON", "TIBC", "FERRITIN" in the last 168 hours. Inpatient medications:  Chlorhexidine Gluconate Cloth  6 each Topical Q0600   heparin  5,000 Units Subcutaneous Q8H   insulin aspart  0-6 Units Subcutaneous Q4H   levETIRAcetam  500 mg Oral BID     acetaminophen, docusate sodium, hydrALAZINE, LORazepam, OLANZapine, ondansetron (ZOFRAN) IV, polyethylene glycol

## 2023-03-30 NOTE — Progress Notes (Signed)
Green emesis x 3 this shift provider aware. Pt denies abdominal pain. PRN Zofran given.

## 2023-03-30 NOTE — Progress Notes (Signed)
PROGRESS NOTE    [name] [name]  QQV:[id number] DOB: September 05, 1980 DOA: 03/27/2023 PCP: Claiborne Rigg, NP   Brief Narrative:  This 42 years old male with PMH significant for essential hypertension, ESRD on hemodialysis, SLE with APLA previously on Eliquis, CVA, chronic thrombocytopenia, anemia of chronic disease, history of seizures, history of medication noncompliance presented to the ED from dialysis for not acting himself during dialysis.  Patient was found to have active seizures in the ER with tongue biting and was hypoxic with SpO2 of 60% on room air which improved to 100% on nonrebreather and was treated with IV Ativan.  Patient remained postictal in the ER occasionally agitated.  He was loaded with Keppra.  Neurology was consulted.  CT head without acute bleed but possibly shows new stroke.  Patient had multiple ER visits for breakthrough seizures.  MRI showed acute infarct of the left PCA territory, no hemorrhage or mass effect.  Patient was admitted in the ICU. 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 Patient is awake and oriented, following commands. No further seizure activity, EEG w/o seizure activity.  Continue Keppra 500 BID Continue Ativan prn for breakthrough seizure. MRI still pending given patient cooperation. Continue Olanzapine prn for agitation Passed bedside swallow eval.  Started on renal diet. Continue Sz precautions. Neurology consult appreciated. Patient does miss about 3-4 doses of Keppra a week. Will reassure that he will set up alarm or talk to his friend to remind him to take his Keppra.   Neurologist recommended to increase compliance not to make changes in the Keppra dosing.   Acute hypoxic respiratory failure: Likely multifactorial. Currently on 4L [place]. Spo2 94% Wean O2 as tolerated Continue hemodialysis as per nephrology.    ESRD on MWF HD Nephrology consulted, appreciate recs.  Monitor intake output, daily weight. He has not completed hemodialysis yesterday because of hypotension.   Essential hypertension: Continue hydralazine as needed. Hoping blood pressure will improve with further hemodialysis. If BP consistently elevated after HD, consider restarting Coreg.   Antiphospholipid Syndrome  SLE  Poor Med Adherence: Pt has hx. of antiphospholipid syndrome, leading to hypercoagulable state and causing hx of strokes.  Patient has tried multiple anticoagulants including warfarin, eliquis, xarelto, but struggles w/ med adherence.  Looks like he was most recently taking Warfarin, but INR on admission suggests pt was not taking regularly. Although warfarin is first line for antiphopsolipid syndrome, given pt has poor follow-up and poor med adherence, Eliquis may be a better option.  Will await MRI and neuro input before restarting oral anticoag, cont SQ hep for now.    Anemia of chronic disease: Chronic thrombocytopenia  Leukocytosis Pt has leukocytosis, which is downtrending since admission.  Could be reactive 2/2 seizure or aspiration pneumonitis.  He remains afebrile and no signs of infxn at this time, will hold antibiotics and trend CBC. - trend CBC, transfuse for Hgb < 7   DVT prophylaxis:  Heparin Code Status: Full code Family Communication:No family at bed side. Disposition Plan:    Status is: Inpatient Remains inpatient appropriate because: Admitted for stroke and seizure disorder.  Nephrology consulted for continuation of hemodialysis.   Consultants:  PCCM Neurology Nephrology  Procedures: MRI Brain  Antimicrobials:  Anti-infectives (From admission, onward)    Start     Dose/Rate Route Frequency Ordered Stop   03/27/23 1545  cefTRIAXone (ROCEPHIN) 1 g in sodium  chloride 0.9 % 100 mL IVPB        1 g 200 mL/hr over 30 Minutes Intravenous  Once 03/27/23 1544 03/27/23 1740   03/27/23  1545  azithromycin (ZITHROMAX) 500 mg in sodium chloride 0.9 % 250 mL IVPB  Status:  Discontinued        500 mg 250 mL/hr over 60 Minutes Intravenous  Once 03/27/23 1544 03/27/23 1727      Subjective: Patient was seen and examined at bedside.  Overnight events noted.   Patient reports feeling better, he is following commands. His blood pressure remains low,  he has not completed hemodialysis yesterday.  Objective: Vitals:   03/30/23 0442 03/30/23 0500 03/30/23 0929 03/30/23 1207  BP: 98/68  (!) 74/56 104/61  Pulse: (!) 108  97 86  Resp:   20 17  Temp: 98 F (36.7 C)  98.1 F (36.7 C) 98.7 F (37.1 C)  TempSrc: Axillary   Oral  SpO2: 95%  97% 95%  Weight:  107.1 kg    Height:        Intake/Output Summary (Last 24 hours) at 03/30/2023 1214 Last data filed at 03/30/2023 0248 Gross per 24 hour  Intake --  Output 10 ml  Net -10 ml   Filed Weights   03/29/23 1257 03/29/23 1615 03/30/23 0500  Weight: 105.1 kg 104.5 kg 107.1 kg    Examination:  General exam: Appears calm and comfortable, deconditioned, not in any acute distress. Respiratory system: CTA bilaterally. Respiratory effort normal.  RR 14 Cardiovascular system: S1 & S2 heard, RRR. No murmur.  No pedal edema. Gastrointestinal system: Abdomen is non distended, soft and non tender.  Normal bowel sounds heard. Central nervous system: Alert and oriented x 3. No focal neurological deficits. Extremities: No edema, no cyanosis, no clubbing Skin: No rashes, lesions or ulcers Psychiatry: Judgement and insight appear normal. Mood & affect appropriate.     Data Reviewed: I have personally reviewed following labs and imaging studies  CBC: Recent Labs  Lab 03/27/23 1329 03/27/23 1340 03/27/23 1404 03/28/23 0316 03/29/23 0954 03/30/23 0437  WBC 13.4*  --   --  11.6* 13.6* 16.1*  NEUTROABS 9.4*  --   --   --   --   --   HGB 11.5* 13.3 11.6* 11.0* 12.6* 12.2*  HCT 37.0* 39.0 34.0* 33.5* 39.3 38.2*  MCV 100.3*  --    --  94.4 93.6 92.9  PLT 125*  --   --  114* 131* 130*   Basic Metabolic Panel: Recent Labs  Lab 03/27/23 1329 03/27/23 1340 03/27/23 1404 03/28/23 0316 03/29/23 1327 03/30/23 0437  NA 141 138 137 137 137 136  K 3.7 3.7 4.0 4.4 6.1* 5.4*  CL 95* 99  --  95* 91* 94*  CO2 16*  --   --  26 24 21*  GLUCOSE 133* 133*  --  102* 111* 109*  BUN 25* 27*  --  33* 74* 69*  CREATININE 11.18* 12.10*  --  13.59* 18.08* 15.40*  CALCIUM 9.6  --   --  10.0 10.5* 10.6*  MG  --   --   --  2.4  --  2.7*  PHOS  --   --   --   --  11.1* 9.9*   GFR: Estimated Creatinine Clearance: 7.9 mL/min (A) (by C-G formula based on SCr of 15.4 mg/dL (H)). Liver Function Tests: Recent Labs  Lab 03/27/23 1329 03/29/23 1327  AST 23  --   ALT 12  --  ALKPHOS 89  --   BILITOT 1.0  --   PROT 9.0*  --   ALBUMIN 3.9 3.7   No results for input(s): "LIPASE", "AMYLASE" in the last 168 hours. No results for input(s): "AMMONIA" in the last 168 hours. Coagulation Profile: Recent Labs  Lab 03/27/23 1329  INR 1.1   Cardiac Enzymes: No results for input(s): "CKTOTAL", "CKMB", "CKMBINDEX", "TROPONINI" in the last 168 hours. BNP (last 3 results) No results for input(s): "PROBNP" in the last 8760 hours. HbA1C: No results for input(s): "HGBA1C" in the last 72 hours. CBG: Recent Labs  Lab 03/29/23 2008 03/30/23 0036 03/30/23 0434 03/30/23 0913 03/30/23 1208  GLUCAP 104* 116* 92 102* 99   Lipid Profile: No results for input(s): "CHOL", "HDL", "LDLCALC", "TRIG", "CHOLHDL", "LDLDIRECT" in the last 72 hours. Thyroid Function Tests: No results for input(s): "TSH", "T4TOTAL", "FREET4", "T3FREE", "THYROIDAB" in the last 72 hours. Anemia Panel: No results for input(s): "VITAMINB12", "FOLATE", "FERRITIN", "TIBC", "IRON", "RETICCTPCT" in the last 72 hours. Sepsis Labs: No results for input(s): "PROCALCITON", "LATICACIDVEN" in the last 168 hours.  No results found for this or any previous visit (from the past 240  hour(s)).   Radiology Studies: DG CHEST PORT 1 VIEW  Result Date: 03/28/2023 CLINICAL DATA:  Altered mental status EXAM: PORTABLE CHEST 1 VIEW COMPARISON:  03/27/2023 FINDINGS: Right-sided central venous catheter tip at the right atrium. Cardiomegaly with vascular congestion. Partially visualized left upper extremity metallic stent, present on multiple prior exams including chest radiographs performed prior to an MRI that was done in February of 2024. Negative for unexpected metallic density foreign body IMPRESSION: Cardiomegaly with vascular congestion. Electronically Signed   By: Jasmine Pang M.D.   On: 03/28/2023 20:00   DG Abd 1 View  Result Date: 03/28/2023 CLINICAL DATA:  Altered mental status EXAM: ABDOMEN - 1 VIEW COMPARISON:  11/08/2022 FINDINGS: Nonobstructed gas pattern. Presumed support wires over the upper and mid abdomen. No unexpected metallic density foreign body is seen IMPRESSION: Nonobstructed gas pattern. Negative for unexpected metallic density foreign body Electronically Signed   By: Jasmine Pang M.D.   On: 03/28/2023 19:58    Scheduled Meds:  Chlorhexidine Gluconate Cloth  6 each Topical Q0600   heparin  5,000 Units Subcutaneous Q8H   insulin aspart  0-6 Units Subcutaneous Q4H   levETIRAcetam  500 mg Oral BID   Continuous Infusions:     LOS: 3 days    Time spent: 35 mins    Willeen Niece, MD [facility]   If 7PM-7AM, please contact night-coverage

## 2023-03-30 NOTE — Progress Notes (Addendum)
Brief Neuro Update:  Neurology inpatient team will signoff. Please let us know if the MRI shows an acute stroke. Possible with his INR of 1.1 at presentation with concern for non compliance with warfarin.  Erick Blinks [facility]

## 2023-03-30 NOTE — Plan of Care (Signed)
  Problem: Education: Goal: Knowledge of disease or condition will improve Outcome: Progressing Goal: Knowledge of secondary prevention will improve (MUST DOCUMENT ALL) Outcome: Progressing Goal: Knowledge of patient specific risk factors will improve Loraine Leriche N/A or DELETE if not current risk factor) Outcome: Progressing   Problem: Ischemic Stroke/TIA Tissue Perfusion: Goal: Complications of ischemic stroke/TIA will be minimized Outcome: Progressing   Problem: Coping: Goal: Will verbalize positive feelings about self Outcome: Progressing Goal: Will identify appropriate support needs Outcome: Progressing   Problem: Health Behavior/Discharge Planning: Goal: Ability to manage health-related needs will improve Outcome: Progressing Goal: Goals will be collaboratively established with patient/family Outcome: Progressing   Problem: Self-Care: Goal: Ability to participate in self-care as condition permits will improve Outcome: Progressing Goal: Verbalization of feelings and concerns over difficulty with self-care will improve Outcome: Progressing Goal: Ability to communicate needs accurately will improve Outcome: Progressing   Problem: Nutrition: Goal: Risk of aspiration will decrease Outcome: Progressing Goal: Dietary intake will improve Outcome: Progressing   Problem: Education: Goal: Knowledge of General Education information will improve Description: Including pain rating scale, medication(s)/side effects and non-pharmacologic comfort measures Outcome: Progressing   Problem: Health Behavior/Discharge Planning: Goal: Ability to manage health-related needs will improve Outcome: Progressing   Problem: Clinical Measurements: Goal: Ability to maintain clinical measurements within normal limits will improve Outcome: Progressing Goal: Will remain free from infection Outcome: Progressing Goal: Diagnostic test results will improve Outcome: Progressing Goal: Respiratory  complications will improve Outcome: Progressing Goal: Cardiovascular complication will be avoided Outcome: Progressing   Problem: Activity: Goal: Risk for activity intolerance will decrease Outcome: Progressing   Problem: Nutrition: Goal: Adequate nutrition will be maintained Outcome: Progressing   Problem: Coping: Goal: Level of anxiety will decrease Outcome: Progressing   Problem: Elimination: Goal: Will not experience complications related to bowel motility Outcome: Progressing Goal: Will not experience complications related to urinary retention Outcome: Progressing   Problem: Pain Managment: Goal: General experience of comfort will improve Outcome: Progressing   Problem: Safety: Goal: Ability to remain free from injury will improve Outcome: Progressing   Problem: Skin Integrity: Goal: Risk for impaired skin integrity will decrease Outcome: Progressing

## 2023-03-31 DIAGNOSIS — D6861 Antiphospholipid syndrome: Secondary | ICD-10-CM | POA: Diagnosis not present

## 2023-03-31 DIAGNOSIS — I6340 Cerebral infarction due to embolism of unspecified cerebral artery: Secondary | ICD-10-CM | POA: Diagnosis not present

## 2023-03-31 DIAGNOSIS — R569 Unspecified convulsions: Secondary | ICD-10-CM | POA: Diagnosis not present

## 2023-03-31 LAB — GLUCOSE, CAPILLARY
Glucose-Capillary: 101 mg/dL — ABNORMAL HIGH (ref 70–99)
Glucose-Capillary: 117 mg/dL — ABNORMAL HIGH (ref 70–99)
Glucose-Capillary: 118 mg/dL — ABNORMAL HIGH (ref 70–99)
Glucose-Capillary: 123 mg/dL — ABNORMAL HIGH (ref 70–99)
Glucose-Capillary: 86 mg/dL (ref 70–99)
Glucose-Capillary: 94 mg/dL (ref 70–99)

## 2023-03-31 MED ORDER — HEPARIN (PORCINE) 25000 UT/250ML-% IV SOLN
1300.0000 [IU]/h | INTRAVENOUS | Status: DC
  Administered 2023-03-31: 1200 [IU]/h via INTRAVENOUS
  Administered 2023-04-01 – 2023-04-02 (×2): 1300 [IU]/h via INTRAVENOUS
  Filled 2023-03-31 (×3): qty 250

## 2023-03-31 MED ORDER — ATORVASTATIN CALCIUM 40 MG PO TABS
40.0000 mg | ORAL_TABLET | Freq: Every day | ORAL | Status: DC
  Administered 2023-03-31 – 2023-04-20 (×20): 40 mg via ORAL
  Filled 2023-03-31 (×20): qty 1

## 2023-03-31 MED ORDER — CINACALCET HCL 30 MG PO TABS
60.0000 mg | ORAL_TABLET | Freq: Every day | ORAL | Status: DC
  Administered 2023-03-31 – 2023-04-19 (×18): 60 mg via ORAL
  Filled 2023-03-31 (×23): qty 2

## 2023-03-31 MED ORDER — SEVELAMER CARBONATE 800 MG PO TABS
3200.0000 mg | ORAL_TABLET | Freq: Three times a day (TID) | ORAL | Status: DC
  Administered 2023-03-31 – 2023-04-20 (×46): 3200 mg via ORAL
  Filled 2023-03-31 (×48): qty 4

## 2023-03-31 MED ORDER — WARFARIN - PHARMACIST DOSING INPATIENT
Freq: Every day | Status: DC
  Administered 2023-04-14: 1

## 2023-03-31 MED ORDER — CHLORHEXIDINE GLUCONATE CLOTH 2 % EX PADS
6.0000 | MEDICATED_PAD | Freq: Every day | CUTANEOUS | Status: DC
  Administered 2023-04-01 – 2023-04-07 (×3): 6 via TOPICAL

## 2023-03-31 MED ORDER — WARFARIN SODIUM 5 MG PO TABS
5.0000 mg | ORAL_TABLET | Freq: Once | ORAL | Status: AC
  Administered 2023-03-31: 5 mg via ORAL
  Filled 2023-03-31: qty 1

## 2023-03-31 NOTE — Progress Notes (Signed)
STROKE TEAM PROGRESS NOTE   SUBJECTIVE (INTERVAL HISTORY) No family is at the bedside.  Overall his condition is gradually improving.  Patient reclining in bed, having lunch, awake alert eyes open, still have slight left-sided weakness and right nasolabial fold flattening.  MRI showed scattered small infarcts, consistent with subtherapeutic INR on Coumadin.   OBJECTIVE Temp:  [98 F (36.7 C)-99.1 F (37.3 C)] 98.4 F (36.9 C) (10/13 1123) Pulse Rate:  [83-97] 90 (10/13 1123) Cardiac Rhythm: Normal sinus rhythm (10/13 0716) Resp:  [17] 17 (10/13 1123) BP: (94-155)/(57-96) 143/92 (10/13 1123) SpO2:  [95 %-100 %] 99 % (10/13 1123) Weight:  [111.4 kg] 111.4 kg (10/13 0500)  Recent Labs  Lab 03/30/23 1954 03/30/23 2342 03/31/23 0332 03/31/23 0728 03/31/23 1121  GLUCAP 75 95 94 101* 123*   Recent Labs  Lab 03/27/23 1329 03/27/23 1340 03/27/23 1404 03/28/23 0316 03/29/23 1327 03/30/23 0437  NA 141 138 137 137 137 136  K 3.7 3.7 4.0 4.4 6.1* 5.4*  CL 95* 99  --  95* 91* 94*  CO2 16*  --   --  26 24 21*  GLUCOSE 133* 133*  --  102* 111* 109*  BUN 25* 27*  --  33* 74* 69*  CREATININE 11.18* 12.10*  --  13.59* 18.08* 15.40*  CALCIUM 9.6  --   --  10.0 10.5* 10.6*  MG  --   --   --  2.4  --  2.7*  PHOS  --   --   --   --  11.1* 9.9*   Recent Labs  Lab 03/27/23 1329 03/29/23 1327  AST 23  --   ALT 12  --   ALKPHOS 89  --   BILITOT 1.0  --   PROT 9.0*  --   ALBUMIN 3.9 3.7   Recent Labs  Lab 03/27/23 1329 03/27/23 1340 03/27/23 1404 03/28/23 0316 03/29/23 0954 03/30/23 0437  WBC 13.4*  --   --  11.6* 13.6* 16.1*  NEUTROABS 9.4*  --   --   --   --   --   HGB 11.5* 13.3 11.6* 11.0* 12.6* 12.2*  HCT 37.0* 39.0 34.0* 33.5* 39.3 38.2*  MCV 100.3*  --   --  94.4 93.6 92.9  PLT 125*  --   --  114* 131* 130*   No results for input(s): "CKTOTAL", "CKMB", "CKMBINDEX", "TROPONINI" in the last 168 hours. No results for input(s): "LABPROT", "INR" in the last 72  hours. No results for input(s): "COLORURINE", "LABSPEC", "PHURINE", "GLUCOSEU", "HGBUR", "BILIRUBINUR", "KETONESUR", "PROTEINUR", "UROBILINOGEN", "NITRITE", "LEUKOCYTESUR" in the last 72 hours.  Invalid input(s): "APPERANCEUR"     Component Value Date/Time   CHOL 206 (H) 01/27/2023 0351   CHOL 194 09/27/2020 1140   TRIG 371 (H) 01/27/2023 0351   HDL 27 (L) 01/27/2023 0351   HDL 56 09/27/2020 1140   CHOLHDL 7.6 01/27/2023 0351   VLDL 74 (H) 01/27/2023 0351   LDLCALC 105 (H) 01/27/2023 0351   LDLCALC 114 (H) 09/27/2020 1140   Lab Results  Component Value Date   HGBA1C 5.2 01/19/2023      Component Value Date/Time   LABOPIA NONE DETECTED 02/15/2009 1435   COCAINSCRNUR NONE DETECTED 02/15/2009 1435   LABBENZ NONE DETECTED 02/15/2009 1435   AMPHETMU NONE DETECTED 02/15/2009 1435   THCU NONE DETECTED 02/15/2009 1435   LABBARB  02/15/2009 1435    NONE DETECTED        DRUG SCREEN FOR MEDICAL PURPOSES ONLY.  IF CONFIRMATION  IS NEEDED FOR ANY PURPOSE, NOTIFY LAB WITHIN 5 DAYS.        LOWEST DETECTABLE LIMITS FOR URINE DRUG SCREEN Drug Class       Cutoff (ng/mL) Amphetamine      1000 Barbiturate      200 Benzodiazepine   200 Tricyclics       300 Opiates          300 Cocaine          300 THC              50    Recent Labs  Lab 03/27/23 1700  ETH <10    I have personally reviewed the radiological images below and agree with the radiology interpretations.  MR BRAIN WO CONTRAST  Result Date: 03/30/2023 CLINICAL DATA:  History of seizures.  Altered mental status. EXAM: MRI HEAD WITHOUT CONTRAST TECHNIQUE: Multiplanar, multiecho pulse sequences of the brain and surrounding structures were obtained without intravenous contrast. COMPARISON:  MRI brain 01/26/2023.  Head CT 03/27/2023. FINDINGS: Brain: Scattered foci of acute and subacute infarction throughout both cerebral hemispheres, including the bilateral ACA, MCA and PCA territories. No acute hemorrhage or significant mass  effect. Interval evolution of the left occipital and left cerebellar infarcts. Unchanged encephalomalacia along the left frontoparietal junction and right frontal operculum. Stable background of severe chronic small-vessel disease. No hydrocephalus or extra-axial collection. Vascular: Normal flow voids. Skull and upper cervical spine: Unchanged diffusely low marrow signal of the upper cervical spine. Sinuses/Orbits: No acute findings. Other: None. IMPRESSION: 1. Scattered foci of acute and subacute infarction throughout both cerebral hemispheres, including the bilateral ACA, MCA and PCA territories. No acute hemorrhage or significant mass effect. 2. Interval evolution of the left occipital and left cerebellar infarcts. 3. Unchanged diffusely low marrow signal of the upper cervical spine, which can be seen in the setting of anemia and/or renal osteodystrophy. Electronically Signed   By: Orvan Falconer M.D.   On: 03/30/2023 20:56   DG CHEST PORT 1 VIEW  Result Date: 03/28/2023 CLINICAL DATA:  Altered mental status EXAM: PORTABLE CHEST 1 VIEW COMPARISON:  03/27/2023 FINDINGS: Right-sided central venous catheter tip at the right atrium. Cardiomegaly with vascular congestion. Partially visualized left upper extremity metallic stent, present on multiple prior exams including chest radiographs performed prior to an MRI that was done in February of 2024. Negative for unexpected metallic density foreign body IMPRESSION: Cardiomegaly with vascular congestion. Electronically Signed   By: Jasmine Pang M.D.   On: 03/28/2023 20:00   DG Abd 1 View  Result Date: 03/28/2023 CLINICAL DATA:  Altered mental status EXAM: ABDOMEN - 1 VIEW COMPARISON:  11/08/2022 FINDINGS: Nonobstructed gas pattern. Presumed support wires over the upper and mid abdomen. No unexpected metallic density foreign body is seen IMPRESSION: Nonobstructed gas pattern. Negative for unexpected metallic density foreign body Electronically Signed   By: Jasmine Pang M.D.   On: 03/28/2023 19:58   EEG adult  Result Date: 03/28/2023 Charlsie Quest, MD     03/28/2023  7:08 AM Patient Name: [name] [name] MRN: [id number] Epilepsy Attending: Charlsie Quest Referring Physician/Provider: Gordy Councilman, MD Date: 03/28/2023 Duration: 26.53 mins Patient history:  42 y.o. male with PMH of ESRD on HD MWF, antiphospholipid syndrome prescribed warfarin, multiple infarcts (most recent left PCA 01/2023), seizure, HTN with recent admission for respiratory failure requiring intubation in the setting of seizure and aspiration pneumonitis who presented from HD with abnormal behavior including not wearing a shirt to HD  center. In the ED, he became hypoxic and had witnessed seizure. Was given IV ativan and keppra load. EEG to evaluate for seizure Level of alertness: Awake AEDs during EEG study: LEV Technical aspects: This EEG study was done with scalp electrodes positioned according to the 10-20 International system of electrode placement. Electrical activity was reviewed with band pass filter of 1-70Hz , sensitivity of 7 uV/mm, display speed of 50mm/sec with a 60Hz  notched filter applied as appropriate. EEG data were recorded continuously and digitally stored.  Video monitoring was available and reviewed as appropriate. Description: No posterior dominant rhythm was seen. EEG showed continuous generalized polymorphic sharply contoured high amplitude 3 to 5 Hz theta-delta slowing. Hyperventilation and photic stimulation were not performed.   ABNORMALITY - Continuous slow, generalized IMPRESSION: This study is suggestive of moderate to severe diffuse encephalopathy. No seizures or epileptiform discharges were seen throughout the recording. Priyanka Annabelle Harman   CT HEAD WO CONTRAST ( )  Result Date: 03/27/2023 CLINICAL DATA:  Mental status change EXAM: CT HEAD WITHOUT CONTRAST TECHNIQUE: Contiguous axial images were obtained from the base of the skull through the vertex without  intravenous contrast. RADIATION DOSE REDUCTION: This exam was performed according to the departmental dose-optimization program which includes automated exposure control, adjustment of the mA and/or kV according to patient size and/or use of iterative reconstruction technique. COMPARISON:  CT brain 01/02/2023, 12/29/2022, 11/08/2022, MRI 08/10/2022 FINDINGS: Brain: Negative for hemorrhage, mass effect or midline shift. Atrophy and advanced chronic small vessel ischemic changes of the white matter. Chronic left parietal infarct with encephalomalacia. Interval left posterior parieto-occipital infarct, series 2, image 24 which appears more subacute to chronic but is new from CT in July. Multiple small chronic infarcts in the left cerebellum, though with suspected interval left cerebellar hypo density on series 2, image 12. Vascular: No hyperdense vessels.  No unexpected calcification Skull: Normal. Negative for fracture or focal lesion. Sinuses/Orbits: Mild mucosal sinus changes Other: None IMPRESSION: 1. Negative for acute intracranial hemorrhage. 2. Atrophy and advanced chronic small vessel ischemic changes of the white matter. Chronic left parietal infarct. Interval left posterior parieto-occipital infarct which appears more subacute to chronic in nature but is new from CT in July. Multiple small chronic infarcts in the left cerebellum, though with suspected interval left cerebellar hypo density, raising concern for possible acute or subacute infarct. Consider further assessment with MRI. Electronically Signed   By: Jasmine Pang M.D.   On: 03/27/2023 15:35   DG Chest Portable 1 View  Result Date: 03/27/2023 CLINICAL DATA:  Hypoxia EXAM: PORTABLE CHEST 1 VIEW COMPARISON:  01/02/2023 FINDINGS: Right-sided central venous catheter tip at the right atrium. Cardiomegaly with vascular congestion and diffuse hazy pulmonary density consistent with edema. No pleural effusion or pneumothorax. Partially visualized stent in  the left upper extremity. IMPRESSION: Cardiomegaly with vascular congestion and diffuse hazy pulmonary density suspicious for mild pulmonary edema. Electronically Signed   By: Jasmine Pang M.D.   On: 03/27/2023 15:25     PHYSICAL EXAM  Temp:  [98 F (36.7 C)-99.1 F (37.3 C)] 98.4 F (36.9 C) (10/13 1123) Pulse Rate:  [83-97] 90 (10/13 1123) Resp:  [17] 17 (10/13 1123) BP: (94-155)/(57-96) 143/92 (10/13 1123) SpO2:  [95 %-100 %] 99 % (10/13 1123) Weight:  [111.4 kg] 111.4 kg (10/13 0500)  General - Well nourished, well developed, in no apparent distress, flat affect.  Ophthalmologic - fundi not visualized due to noncooperation.  Cardiovascular - Regular rhythm and rate.  Neuro - awake, alert, eyes  open, orientated to age, place, year but not to month. No aphasia, fluent language but paucity of speech, following all simple commands. Able to name and repeat, but psychomotor slowing. No gaze palsy, tracking bilaterally, visual field full. Mild right nasolabial fold flattening. Tongue midline. RUE 4/5 no drift, LUE 0/5 proximally not able to lift arm up but bicep 4-/5, tricep 4/5 and finger grip 4/5. RLE 4/5, LLE proximal 3/5 and distally 4-/5. Sensation decreased on the left, b/l FTN intact grossly, gait not tested.     ASSESSMENT/PLAN Mr. DELVON CHIPPS is a 42 y.o. male with history of ESRD on hemodialysis, SLE with antiphospholipid syndrome syndrome on Coumadin, hypertension, seizure on Keppra, history of stroke admitted for seizure. No tPA given due to outside window.    Seizure History of seizure Patient has history of complex partial seizure, known by neurology service Being seen several times in the past for seizure in clinic and hospital Home medication Keppra 500 twice daily Questionable compliance Admitted this time for seizure and aspiration pneumonitis.  Received Ativan and Keppra load EEG showed moderate to severe diffuse encephalopathy, no seizures or epileptiform  discharges. Continue Keppra  Stroke:  bilateral scattered punctate embolic shower, likely secondary to antiphospholipid syndrome on Coumadin with substituted INR and noncompliance CT no acute abnormality, old left parietal, PCA and cerebellar infarcts MRI scattered bilateral punctate infarcts.  No acute hemorrhage or mass effect. Heparin IV for VTE prophylaxis warfarin daily but not compliant prior to admission, now on warfarin daily with heparin IV bridge Patient counseled to be compliant with his antithrombotic medications Ongoing aggressive stroke risk factor management Therapy recommendations: Pending Disposition: Pending  History of stroke 01/2023 admitted for left PCA infarct.  MRI unremarkable.  Carotid Doppler unremarkable.  EF 55 to 60%.  LDL 105, A1c 5.2.  Discharged on Eliquis and Lipitor 40.  SLE with antiphospholipid syndrome Was on Eliquis before but noncompliant Changed to Coumadin with admission INR 1.1, seems also noncompliant However, given patient's ESRD on dialysis and antiphospholipid syndrome, Coumadin is the best option and he has not much choices. Now Coumadin with heparin IV bridge, INR goal 2.5-3.5  Hypertension Stable Long term BP goal normotensive  Hyperlipidemia Home meds: Lipitor 40, doubt compliance LDL 105 in 01/2023, goal < 70 Now on Lipitor 40 Continue statin at discharge  Other Stroke Risk Factors Medication noncompliance  Other Active Problems Leukocytosis WBC 16.1 Yesterday HD  Hospital day # 4  Neurology will sign off. Please call with questions. Pt will follow up with Dr. Karel Jarvis at [facility] in about 4 weeks. Thanks for the consult.   Marvel Plan, MD PhD Stroke Neurology 03/31/2023 2:53 PM    To contact Stroke Continuity provider, please refer to [email or web address]. After hours, contact General Neurology

## 2023-03-31 NOTE — Plan of Care (Signed)
  Problem: Nutrition: Goal: Risk of aspiration will decrease Outcome: Progressing   Problem: Nutrition: Goal: Dietary intake will improve Outcome: Progressing   Problem: Safety: Goal: Ability to remain free from injury will improve Outcome: Progressing   Problem: Skin Integrity: Goal: Risk for impaired skin integrity will decrease Outcome: Progressing

## 2023-03-31 NOTE — Progress Notes (Signed)
PHARMACY - ANTICOAGULATION CONSULT NOTE  Pharmacy Consult for heparin bridge  Indication:  APLA syndrome  Allergies  Allergen Reactions   Zyrtec [Cetirizine] Swelling    FACIAL SWELLING, NOT CATEGORIZED   Cetirizine & Related Swelling    Patient Measurements: Height: 6\' 1"  (185.4 cm) Weight: 111.4 kg (245 lb 9.5 oz) IBW/kg (Calculated) : 79.9 Heparin Dosing Weight: 103.3 kg   Vital Signs: Temp: 98.5 F (36.9 C) (10/13 1504) Temp Source: Oral (10/13 1504) BP: 159/98 (10/13 1504) Pulse Rate: 89 (10/13 1504)  Labs: Recent Labs    03/29/23 0954 03/29/23 1327 03/30/23 0437  HGB 12.6*  --  12.2*  HCT 39.3  --  38.2*  PLT 131*  --  130*  CREATININE  --  18.08* 15.40*    Estimated Creatinine Clearance: 8.1 mL/min (A) (by C-G formula based on SCr of 15.4 mg/dL (H)).   Medical History: Past Medical History:  Diagnosis Date   ESRD on hemodialysis (HCC)    Hypertension    Lupus    Seizures (HCC)    Seizures (HCC)    Stroke (HCC)    77 or 42 years of age.     Wears glasses     Medications:  Scheduled:   atorvastatin  40 mg Oral Daily   [START ON 04/01/2023] Chlorhexidine Gluconate Cloth  6 each Topical Q0600   cinacalcet  60 mg Oral Q supper   insulin aspart  0-6 Units Subcutaneous Q4H   levETIRAcetam  500 mg Oral BID   sevelamer carbonate  3,200 mg Oral TID WC   warfarin  5 mg Oral ONCE-1600   Warfarin - Pharmacist Dosing Inpatient   Does not apply q1600    Assessment: 42 yo M presented with seizures with HD. Patient has history antiphospholipid syndrome, leading to hypercoagulable state and causing hx of strokes. INR on admission 1.1 - seemed like not taking Warfarin at home. Warfarin held and sq heparin initiated in interim. MRI showing multiple acute and subacute infarcts. Per MD, discussed with neurology and would like to start Warfarin and use heparin bridge until therapeutic.  Hgb 12.2, plt 130. No s/sx of bleeding.   Goal of Therapy:  Heparin level  0.3-0.5 units/ml INR 2.5-3.5 per neurology  Monitor platelets by anticoagulation protocol: Yes   Plan:  Start heparin infusion at 1200 units/hr  Order heparin level in 8 hours Order warfarin 5 mg tonight (as per previous PharmD note today) Monitor daily HL, CBC, and for s/sx of bleeding  Thank you for allowing pharmacy to participate in this patient's care,  Sherron Monday, PharmD, BCCCP Clinical Pharmacist  Phone: [phone] 03/31/2023 4:52 PM  Please check AMION for all [facility] Pharmacy phone numbers After 10:00 PM, call Main Pharmacy [phone]

## 2023-03-31 NOTE — Progress Notes (Signed)
PHARMACY - ANTICOAGULATION CONSULT NOTE  Pharmacy Consult for Warfarin  Indication: DVT; stroke prophylaxis   Allergies  Allergen Reactions   Zyrtec [Cetirizine] Swelling    FACIAL SWELLING, NOT CATEGORIZED   Cetirizine & Related Swelling    Patient Measurements: Height: 6\' 1"  (185.4 cm) Weight: 111.4 kg (245 lb 9.5 oz) IBW/kg (Calculated) : 79.9  Vital Signs: Temp: 98.4 F (36.9 C) (10/13 1123) Temp Source: Oral (10/13 1123) BP: 143/92 (10/13 1123) Pulse Rate: 90 (10/13 1123)  Labs: Recent Labs    03/29/23 0954 03/29/23 1327 03/30/23 0437  HGB 12.6*  --  12.2*  HCT 39.3  --  38.2*  PLT 131*  --  130*  CREATININE  --  18.08* 15.40*    Estimated Creatinine Clearance: 8.1 mL/min (A) (by C-G formula based on SCr of 15.4 mg/dL (H)).   Medical History: Past Medical History:  Diagnosis Date   ESRD on hemodialysis (HCC)    Hypertension    Lupus    Seizures (HCC)    Seizures (HCC)    Stroke (HCC)    26 or 42 years of age.     Wears glasses     Medications:  Scheduled:   [START ON 04/01/2023] Chlorhexidine Gluconate Cloth  6 each Topical Q0600   cinacalcet  60 mg Oral Q supper   heparin  5,000 Units Subcutaneous Q8H   insulin aspart  0-6 Units Subcutaneous Q4H   levETIRAcetam  500 mg Oral BID   sevelamer carbonate  3,200 mg Oral TID WC   PRN: acetaminophen, docusate sodium, hydrALAZINE, LORazepam, OLANZapine, ondansetron (ZOFRAN) IV, polyethylene glycol  Assessment: Patient is a 42 yo M presented with seizures with HD. Patient has history antiphospholipid syndrome, leading to hypercoagulable state and causing hx of strokes. INR on admission 1.1 - seemed like not taking Warfarin at home. Warfarin held and sq heparin initiated in interim. MRI showing multiple acute and subacute infarcts. Per MD, discussed with neurology and would like to start Warfarin. Pharmacy consulted for Warfarin dosing for VTE treatment (although no mention of VTE in chart)  Goal of  Therapy:  INR 2-3 Monitor platelets by anticoagulation protocol: Yes   Plan:  Start Warfarin 5mg  x1  Discontinue SQ Heparin  Daily INR, CBC  Monitor signs of bleeding  Watch for new DDI  Svara Twyman 03/31/2023,2:33 PM

## 2023-03-31 NOTE — Progress Notes (Signed)
[facility] Progress Note  Subjective: seen in room. Interacting normally today. Doesn't remember the first few days he was here.   Vitals:   03/31/23 0341 03/31/23 0500 03/31/23 0727 03/31/23 1123  BP: (!) 137/96  (!) 155/96 (!) 143/92  Pulse: 96  97 90  Resp:   17 17  Temp: 99.1 F (37.3 C)  98.4 F (36.9 C) 98.4 F (36.9 C)  TempSrc: Oral  Oral Oral  SpO2: 96%  95% 99%  Weight:  111.4 kg    Height:        Exam: Gen alert and interacting today, eating breakfast No jvd or bruits Chest clear bilat to bases RRR no MRG Abd soft ntnd no mass or ascites +bs Ext no  LE edema Neuro as above    RIJ TDC intact      Renal-related home meds: - norvasc 10 - coreg 12.5 bid - sensipar 60mg  hs - eliquis 2.5 bid - midodrine 5mg  tid - renavite - renvela 4 ac tid      OP HD: [facility] MWF  4h   400/800   101.5kg   2/2 bath   RIJ TDC    Heparin none - last OP HD today 10/9, post wt 110.5kg (9kg over) - mircera 50 mcg IV q 2wks, last 10/2, due 10/16  - venofer 100mg  three times per week thru today - rocaltrol 2.0 mcg      CXR 10/09 - IMPRESSION: Cardiomegaly with vascular congestion and diffuse hazy pulmonary density suspicious for mild pulmonary edema.   Assessment/ Plan: Seizures - acute on chronic, takes Keppra at home. Getting keppra here. Awaiting MRI results. Pt still sometimes agitated. H/o antiphospholipid syndrome - hx SLE, has been tried on multiple anticoagulants in the past but struggles w/ medication adherence.  ESRD - on HD MWF. Had full OP HD on day of admission. Had extra HD yesterday/ Thursday and regular HD Friday. Next HD Monday.  HTN/ BP - BP's high on admission, normal now.  Volume - CXR admit showed edema. 4 L off w/ HD Thursday, and no UF on Friday due to low BP's. Wt's are off.  Anemia esrd - Hb 11- 13, no esa needs. Next esa due 10/16.  MBD ckd - CCa is high, and phos is very high. Resuming binder and sensipar today. Holding po vdra due to  [employer].   Vinson Moselle MD  [facility] 03/31/2023, 11:43 AM  Recent Labs  Lab 03/27/23 1329 03/27/23 1340 03/29/23 0954 03/29/23 1327 03/30/23 0437  HGB 11.5*   < > 12.6*  --  12.2*  ALBUMIN 3.9  --   --  3.7  --   CALCIUM 9.6   < >  --  10.5* 10.6*  PHOS  --   --   --  11.1* 9.9*  CREATININE 11.18*   < >  --  18.08* 15.40*  K 3.7   < >  --  6.1* 5.4*   < > = values in this interval not displayed.   No results for input(s): "IRON", "TIBC", "FERRITIN" in the last 168 hours. Inpatient medications:  Chlorhexidine Gluconate Cloth  6 each Topical Q0600   heparin  5,000 Units Subcutaneous Q8H   insulin aspart  0-6 Units Subcutaneous Q4H   levETIRAcetam  500 mg Oral BID     acetaminophen, docusate sodium, hydrALAZINE, LORazepam, OLANZapine, ondansetron (ZOFRAN) IV, polyethylene glycol

## 2023-03-31 NOTE — Progress Notes (Signed)
PROGRESS NOTE    SOLLY DAMME  WUJ:[id number] DOB: 08/25/1980 DOA: 03/27/2023 PCP: Claiborne Rigg, NP   Brief Narrative:  This 42 years old male with PMH significant for essential hypertension, ESRD on hemodialysis, SLE with APLA previously on Eliquis, CVA, chronic thrombocytopenia, anemia of chronic disease, history of seizures, history of medication noncompliance presented to the ED from dialysis for not acting himself during dialysis.  Patient was found to have active seizures in the ER with tongue biting and was hypoxic with SpO2 of 60% on room air which improved to 100% on nonrebreather and was treated with IV Ativan.  Patient remained postictal in the ER occasionally agitated.  He was loaded with Keppra.  Neurology was consulted.  CT head without acute bleed but possibly shows new stroke.  Patient had multiple ER visits for breakthrough seizures.  MRI showed acute infarct of the left PCA territory, no hemorrhage or mass effect.  Patient was admitted in the ICU. 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 Patient is awake and oriented, following commands. No further seizure activity, EEG w/o seizure activity.  Continue Keppra 500 BID Continue Ativan prn for breakthrough seizure. MRI showed multiple acute and subacute infarcts. Continue Olanzapine prn for agitation Passed bedside swallow eval.  Started on renal diet. Continue Sz precautions. Neurology consult appreciated. Patient does miss about 3-4 doses of Keppra a week. Will reassure that he will set up alarm or talk to his friend to remind him to take his Keppra.   Neurologist recommended to increase compliance not to make changes in the Keppra dosing. Patient resumed on coumadin.   Acute hypoxic respiratory failure: Likely multifactorial. Currently on 2L [place]. Spo2 94% Wean O2 as tolerated Continue  hemodialysis as per nephrology.   ESRD on MWF HD Nephrology consulted, appreciate recs.  Monitor intake output, daily weight. Continue HD as per schedule.  Essential hypertension: Continue hydralazine as needed. Hoping blood pressure will improve with further hemodialysis. If BP consistently elevated after HD, consider restarting Coreg.   Antiphospholipid Syndrome  SLE  Poor Med Adherence: Pt has hx. of antiphospholipid syndrome, leading to hypercoagulable state and causing hx of strokes.  Patient has tried multiple anticoagulants including warfarin, eliquis, xarelto, but struggles w/ med adherence.  Looks like he was most recently taking Warfarin, but INR on admission suggests pt was not taking regularly. Although warfarin is first line for antiphopsolipid syndrome, given pt has poor follow-up and poor med adherence, Eliquis may be a better option.  Discussed with Neurology , Patient resumed on coumadin   Anemia of chronic disease: Chronic thrombocytopenia  Leukocytosis Pt has leukocytosis, which is downtrending since admission.  Could be reactive 2/2 seizure or aspiration pneumonitis.  He remains afebrile and no signs of infxn at this time, will hold antibiotics and trend CBC. - trend CBC, transfuse for Hgb < 7   DVT prophylaxis:  Heparin Code Status: Full code Family Communication: No family at bed side. Disposition Plan:    Status is: Inpatient Remains inpatient appropriate because: Admitted for stroke and seizure disorder.  Nephrology consulted for continuation of hemodialysis.   Consultants:  PCCM Neurology Nephrology  Procedures: MRI Brain  Antimicrobials:  Anti-infectives (From admission, onward)    Start     Dose/Rate Route Frequency Ordered Stop   03/27/23 1545  cefTRIAXone (ROCEPHIN) 1 g in sodium chloride 0.9 % 100 mL IVPB  1 g 200 mL/hr over 30 Minutes Intravenous  Once 03/27/23 1544 03/27/23 1740   03/27/23 1545  azithromycin (ZITHROMAX) 500 mg  in sodium chloride 0.9 % 250 mL IVPB  Status:  Discontinued        500 mg 250 mL/hr over 60 Minutes Intravenous  Once 03/27/23 1544 03/27/23 1727      Subjective: Patient was seen and examined at bedside. Overnight events noted.   Patient seems much improved today, Alert and awake, had an MRI. Marland Kitchen  Objective: Vitals:   03/31/23 0341 03/31/23 0500 03/31/23 0727 03/31/23 1123  BP: (!) 137/96  (!) 155/96 (!) 143/92  Pulse: 96  97 90  Resp:   17 17  Temp: 99.1 F (37.3 C)  98.4 F (36.9 C) 98.4 F (36.9 C)  TempSrc: Oral  Oral Oral  SpO2: 96%  95% 99%  Weight:  111.4 kg    Height:        Intake/Output Summary (Last 24 hours) at 03/31/2023 1351 Last data filed at 03/31/2023 0516 Gross per 24 hour  Intake 720 ml  Output --  Net 720 ml   Filed Weights   03/29/23 1615 03/30/23 0500 03/31/23 0500  Weight: 104.5 kg 107.1 kg 111.4 kg    Examination:  General exam: Appears calm and comfortable, deconditioned, not in any acute distress. Respiratory system: CTA bilaterally. Respiratory effort normal.  RR 13 Cardiovascular system: S1 & S2 heard, RRR. No murmur.  No pedal edema. Gastrointestinal system: Abdomen is non distended, soft and non tender.  Normal bowel sounds heard. Central nervous system: Alert and oriented x 3. No focal neurological deficits. Extremities: No edema, no cyanosis, no clubbing Skin: No rashes, lesions or ulcers Psychiatry: Judgement and insight appear normal. Mood & affect appropriate.     Data Reviewed: I have personally reviewed following labs and imaging studies  CBC: Recent Labs  Lab 03/27/23 1329 03/27/23 1340 03/27/23 1404 03/28/23 0316 03/29/23 0954 03/30/23 0437  WBC 13.4*  --   --  11.6* 13.6* 16.1*  NEUTROABS 9.4*  --   --   --   --   --   HGB 11.5* 13.3 11.6* 11.0* 12.6* 12.2*  HCT 37.0* 39.0 34.0* 33.5* 39.3 38.2*  MCV 100.3*  --   --  94.4 93.6 92.9  PLT 125*  --   --  114* 131* 130*   Basic Metabolic Panel: Recent Labs  Lab  03/27/23 1329 03/27/23 1340 03/27/23 1404 03/28/23 0316 03/29/23 1327 03/30/23 0437  NA 141 138 137 137 137 136  K 3.7 3.7 4.0 4.4 6.1* 5.4*  CL 95* 99  --  95* 91* 94*  CO2 16*  --   --  26 24 21*  GLUCOSE 133* 133*  --  102* 111* 109*  BUN 25* 27*  --  33* 74* 69*  CREATININE 11.18* 12.10*  --  13.59* 18.08* 15.40*  CALCIUM 9.6  --   --  10.0 10.5* 10.6*  MG  --   --   --  2.4  --  2.7*  PHOS  --   --   --   --  11.1* 9.9*   GFR: Estimated Creatinine Clearance: 8.1 mL/min (A) (by C-G formula based on SCr of 15.4 mg/dL (H)). Liver Function Tests: Recent Labs  Lab 03/27/23 1329 03/29/23 1327  AST 23  --   ALT 12  --   ALKPHOS 89  --   BILITOT 1.0  --   PROT 9.0*  --  ALBUMIN 3.9 3.7   No results for input(s): "LIPASE", "AMYLASE" in the last 168 hours. No results for input(s): "AMMONIA" in the last 168 hours. Coagulation Profile: Recent Labs  Lab 03/27/23 1329  INR 1.1   Cardiac Enzymes: No results for input(s): "CKTOTAL", "CKMB", "CKMBINDEX", "TROPONINI" in the last 168 hours. BNP (last 3 results) No results for input(s): "PROBNP" in the last 8760 hours. HbA1C: No results for input(s): "HGBA1C" in the last 72 hours. CBG: Recent Labs  Lab 03/30/23 1954 03/30/23 2342 03/31/23 0332 03/31/23 0728 03/31/23 1121  GLUCAP 75 95 94 101* 123*   Lipid Profile: No results for input(s): "CHOL", "HDL", "LDLCALC", "TRIG", "CHOLHDL", "LDLDIRECT" in the last 72 hours. Thyroid Function Tests: No results for input(s): "TSH", "T4TOTAL", "FREET4", "T3FREE", "THYROIDAB" in the last 72 hours. Anemia Panel: No results for input(s): "VITAMINB12", "FOLATE", "FERRITIN", "TIBC", "IRON", "RETICCTPCT" in the last 72 hours. Sepsis Labs: No results for input(s): "PROCALCITON", "LATICACIDVEN" in the last 168 hours.  No results found for this or any previous visit (from the past 240 hour(s)).   Radiology Studies: MR BRAIN WO CONTRAST  Result Date: 03/30/2023 CLINICAL DATA:   History of seizures.  Altered mental status. EXAM: MRI HEAD WITHOUT CONTRAST TECHNIQUE: Multiplanar, multiecho pulse sequences of the brain and surrounding structures were obtained without intravenous contrast. COMPARISON:  MRI brain 01/26/2023.  Head CT 03/27/2023. FINDINGS: Brain: Scattered foci of acute and subacute infarction throughout both cerebral hemispheres, including the bilateral ACA, MCA and PCA territories. No acute hemorrhage or significant mass effect. Interval evolution of the left occipital and left cerebellar infarcts. Unchanged encephalomalacia along the left frontoparietal junction and right frontal operculum. Stable background of severe chronic small-vessel disease. No hydrocephalus or extra-axial collection. Vascular: Normal flow voids. Skull and upper cervical spine: Unchanged diffusely low marrow signal of the upper cervical spine. Sinuses/Orbits: No acute findings. Other: None. IMPRESSION: 1. Scattered foci of acute and subacute infarction throughout both cerebral hemispheres, including the bilateral ACA, MCA and PCA territories. No acute hemorrhage or significant mass effect. 2. Interval evolution of the left occipital and left cerebellar infarcts. 3. Unchanged diffusely low marrow signal of the upper cervical spine, which can be seen in the setting of anemia and/or renal osteodystrophy. Electronically Signed   By: Orvan Falconer M.D.   On: 03/30/2023 20:56    Scheduled Meds:  [START ON 04/01/2023] Chlorhexidine Gluconate Cloth  6 each Topical Q0600   cinacalcet  60 mg Oral Q supper   heparin  5,000 Units Subcutaneous Q8H   insulin aspart  0-6 Units Subcutaneous Q4H   levETIRAcetam  500 mg Oral BID   sevelamer carbonate  3,200 mg Oral TID WC   Continuous Infusions:     LOS: 4 days    Time spent: 35 mins    Willeen Niece, MD [facility]   If 7PM-7AM, please contact night-coverage

## 2023-04-01 ENCOUNTER — Encounter (HOSPITAL_COMMUNITY): Payer: Self-pay | Admitting: Pulmonary Disease

## 2023-04-01 DIAGNOSIS — R569 Unspecified convulsions: Secondary | ICD-10-CM | POA: Diagnosis not present

## 2023-04-01 LAB — RENAL FUNCTION PANEL
Albumin: 3.8 g/dL (ref 3.5–5.0)
Anion gap: 25 — ABNORMAL HIGH (ref 5–15)
BUN: 107 mg/dL — ABNORMAL HIGH (ref 6–20)
CO2: 22 mmol/L (ref 22–32)
Calcium: 10.7 mg/dL — ABNORMAL HIGH (ref 8.9–10.3)
Chloride: 89 mmol/L — ABNORMAL LOW (ref 98–111)
Creatinine, Ser: 21.49 mg/dL — ABNORMAL HIGH (ref 0.61–1.24)
GFR, Estimated: 2 mL/min — ABNORMAL LOW (ref >60)
Glucose, Bld: 102 mg/dL — ABNORMAL HIGH (ref 70–99)
Phosphorus: 11.7 mg/dL — ABNORMAL HIGH (ref 2.5–4.6)
Potassium: 4.8 mmol/L (ref 3.5–5.1)
Sodium: 136 mmol/L (ref 135–145)

## 2023-04-01 LAB — PROTIME-INR
INR: 1.1 (ref 0.8–1.2)
Prothrombin Time: 14.3 s (ref 11.4–15.2)

## 2023-04-01 LAB — GLUCOSE, CAPILLARY
Glucose-Capillary: 129 mg/dL — ABNORMAL HIGH (ref 70–99)
Glucose-Capillary: 101 mg/dL — ABNORMAL HIGH (ref 70–99)
Glucose-Capillary: 137 mg/dL — ABNORMAL HIGH (ref 70–99)
Glucose-Capillary: 100 mg/dL — ABNORMAL HIGH (ref 70–99)
Glucose-Capillary: 101 mg/dL — ABNORMAL HIGH (ref 70–99)

## 2023-04-01 LAB — CBC
HCT: 39.7 % (ref 39.0–52.0)
Hemoglobin: 13.3 g/dL (ref 13.0–17.0)
MCH: 31 pg (ref 26.0–34.0)
MCHC: 33.5 g/dL (ref 30.0–36.0)
MCV: 92.5 fL (ref 80.0–100.0)
Platelets: 137 10*3/uL — ABNORMAL LOW (ref 150–400)
RBC: 4.29 MIL/uL (ref 4.22–5.81)
RDW: 14 % (ref 11.5–15.5)
WBC: 13.1 10*3/uL — ABNORMAL HIGH (ref 4.0–10.5)
nRBC: 0 % (ref 0.0–0.2)

## 2023-04-01 LAB — HEPARIN LEVEL (UNFRACTIONATED): Heparin Unfractionated: 0.21 [IU]/mL — ABNORMAL LOW (ref 0.30–0.70)

## 2023-04-01 MED ORDER — WARFARIN SODIUM 5 MG PO TABS
5.0000 mg | ORAL_TABLET | Freq: Once | ORAL | Status: AC
  Administered 2023-04-01: 5 mg via ORAL
  Filled 2023-04-01: qty 1

## 2023-04-01 MED ORDER — DIPHENHYDRAMINE HCL 25 MG PO CAPS
25.0000 mg | ORAL_CAPSULE | Freq: Once | ORAL | Status: AC
  Administered 2023-04-01: 25 mg via ORAL
  Filled 2023-04-01: qty 1

## 2023-04-01 NOTE — Progress Notes (Signed)
PROGRESS NOTE    TYAS BINGENHEIMER  NWG:[id number] DOB: April 19, 1981 DOA: 03/27/2023 PCP: Claiborne Rigg, NP   Brief Narrative:  This 42 years old male with PMH significant for essential hypertension, ESRD on hemodialysis, SLE with APLA previously on Eliquis, CVA, chronic thrombocytopenia, anemia of chronic disease, history of seizures, history of medication noncompliance presented to the ED from dialysis for not acting himself during dialysis.  Patient was found to have active seizures in the ER with tongue biting and was hypoxic with SpO2 of 60% on room air which improved to 100% on nonrebreather and was treated with IV Ativan.  Patient remained postictal in the ER occasionally agitated.  He was loaded with Keppra.  Neurology was consulted.  CT head without acute bleed but possibly shows new stroke.  Patient had multiple ER visits for breakthrough seizures.  MRI showed acute infarct of the left PCA territory, no hemorrhage or mass effect.  Patient was admitted in the ICU. 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 Patient was awake and oriented, following commands at admission. No further seizure activity, EEG w/o seizure activity.  Continue Keppra 500 BID Continue Ativan prn for breakthrough seizure. MRI showed multiple acute and subacute infarcts. Continue Olanzapine prn for agitation Passed bedside swallow eval.  Started on renal diet. Continue Sz precautions. Neurology consult appreciated. Patient does miss about 3-4 doses of Keppra a week. Will reassure that he will set up alarm or talk to his friend to remind him to take his Keppra.   Neurologist recommended to increase compliance not to make changes in the Keppra dosing. Patient resumed on coumadin.   Acute hypoxic respiratory failure: Likely multifactorial. Currently on 2L [place]. Spo2 94% Wean O2 as  tolerated. Continue hemodialysis as per nephrology.   ESRD on MWF HD Nephrology consulted, appreciate recs.  Monitor intake output, daily weight. Continue HD as per schedule.  Next hemodialysis today.  Essential hypertension: Continue hydralazine as needed. Hoping blood pressure will improve with further hemodialysis. If BP consistently elevated after HD, consider restarting Coreg.   Antiphospholipid Syndrome  SLE  Poor Med Adherence: Pt has hx. of antiphospholipid syndrome, leading to hypercoagulable state and causing hx of strokes.  Patient has tried multiple anticoagulants including warfarin, eliquis, xarelto, but struggles w/ med adherence.  Looks like he was most recently taking Warfarin, but INR on admission suggests pt was not taking regularly. Although warfarin is first line for antiphopsolipid syndrome, given pt has poor follow-up and poor med adherence, Eliquis may be a better option.  Discussed with Neurology , Patient resumed on coumadin.    Anemia of chronic disease: Chronic thrombocytopenia  Leukocytosis Pt has leukocytosis, which is downtrending since admission.  Could be reactive 2/2 seizure or aspiration pneumonitis.  He remains afebrile and no signs of infxn at this time, will hold antibiotics and trend CBC. - trend CBC, transfuse for Hgb < 7   DVT prophylaxis:  Heparin + coumadin Code Status: Full code Family Communication: No family at bed side. Disposition Plan:    Status is: Inpatient Remains inpatient appropriate because: Admitted for stroke and seizure disorder.  Nephrology consulted for continuation of hemodialysis.   Consultants:  PCCM Neurology Nephrology  Procedures: MRI Brain  Antimicrobials:  Anti-infectives (From admission, onward)    Start     Dose/Rate Route Frequency Ordered Stop   03/27/23 1545  cefTRIAXone (ROCEPHIN) 1 g  in sodium chloride 0.9 % 100 mL IVPB        1 g 200 mL/hr over 30 Minutes Intravenous  Once 03/27/23 1544  03/27/23 1740   03/27/23 1545  azithromycin (ZITHROMAX) 500 mg in sodium chloride 0.9 % 250 mL IVPB  Status:  Discontinued        500 mg 250 mL/hr over 60 Minutes Intravenous  Once 03/27/23 1544 03/27/23 1727      Subjective: Patient was seen and examined at bedside. Overnight events noted.   Patient seems agitated and restless today.  He is going to have dialysis today. .  Objective: Vitals:   03/31/23 1920 03/31/23 2307 04/01/23 0309 04/01/23 0311  BP: (!) 141/100 (!) 144/93 (!) 122/99 134/87  Pulse: 76 93 96 100  Resp:  18  16  Temp: 97.7 F (36.5 C) 98.9 F (37.2 C) 98.8 F (37.1 C) 97.9 F (36.6 C)  TempSrc: Oral Oral Oral Oral  SpO2: 93% 98% 99% 99%  Weight:      Height:        Intake/Output Summary (Last 24 hours) at 04/01/2023 1234 Last data filed at 04/01/2023 0400 Gross per 24 hour  Intake 1054.33 ml  Output --  Net 1054.33 ml   Filed Weights   03/29/23 1615 03/30/23 0500 03/31/23 0500  Weight: 104.5 kg 107.1 kg 111.4 kg    Examination:  General exam: Appears calm and comfortable, deconditioned, not in any acute distress. Respiratory system: CTA bilaterally. Respiratory effort normal.  RR 14 Cardiovascular system: S1 & S2 heard, RRR. No murmur.  No pedal edema. Gastrointestinal system: Abdomen is non distended, soft and non tender.  Normal bowel sounds heard. Central nervous system: Alert and oriented x 3. No focal neurological deficits. Extremities: No edema, no cyanosis, no clubbing Skin: No rashes, lesions or ulcers Psychiatry: Judgement and insight appear normal. Mood & affect appropriate.     Data Reviewed: I have personally reviewed following labs and imaging studies  CBC: Recent Labs  Lab 03/27/23 1329 03/27/23 1340 03/27/23 1404 03/28/23 0316 03/29/23 0954 03/30/23 0437 04/01/23 0406  WBC 13.4*  --   --  11.6* 13.6* 16.1* 13.1*  NEUTROABS 9.4*  --   --   --   --   --   --   HGB 11.5*   < > 11.6* 11.0* 12.6* 12.2* 13.3  HCT 37.0*    < > 34.0* 33.5* 39.3 38.2* 39.7  MCV 100.3*  --   --  94.4 93.6 92.9 92.5  PLT 125*  --   --  114* 131* 130* 137*   < > = values in this interval not displayed.   Basic Metabolic Panel: Recent Labs  Lab 03/27/23 1329 03/27/23 1340 03/27/23 1404 03/28/23 0316 03/29/23 1327 03/30/23 0437 04/01/23 0406  NA 141 138 137 137 137 136 136  K 3.7 3.7 4.0 4.4 6.1* 5.4* 4.8  CL 95* 99  --  95* 91* 94* 89*  CO2 16*  --   --  26 24 21* 22  GLUCOSE 133* 133*  --  102* 111* 109* 102*  BUN 25* 27*  --  33* 74* 69* 107*  CREATININE 11.18* 12.10*  --  13.59* 18.08* 15.40* 21.49*  CALCIUM 9.6  --   --  10.0 10.5* 10.6* 10.7*  MG  --   --   --  2.4  --  2.7*  --   PHOS  --   --   --   --  11.1* 9.9*  11.7*   GFR: Estimated Creatinine Clearance: 5.8 mL/min (A) (by C-G formula based on SCr of 21.49 mg/dL (H)). Liver Function Tests: Recent Labs  Lab 03/27/23 1329 03/29/23 1327 04/01/23 0406  AST 23  --   --   ALT 12  --   --   ALKPHOS 89  --   --   BILITOT 1.0  --   --   PROT 9.0*  --   --   ALBUMIN 3.9 3.7 3.8   No results for input(s): "LIPASE", "AMYLASE" in the last 168 hours. No results for input(s): "AMMONIA" in the last 168 hours. Coagulation Profile: Recent Labs  Lab 03/27/23 1329 04/01/23 0406  INR 1.1 1.1   Cardiac Enzymes: No results for input(s): "CKTOTAL", "CKMB", "CKMBINDEX", "TROPONINI" in the last 168 hours. BNP (last 3 results) No results for input(s): "PROBNP" in the last 8760 hours. HbA1C: No results for input(s): "HGBA1C" in the last 72 hours. CBG: Recent Labs  Lab 03/31/23 1504 03/31/23 1954 03/31/23 2338 04/01/23 0331 04/01/23 1212  GLUCAP 118* 86 117* 129* 101*   Lipid Profile: No results for input(s): "CHOL", "HDL", "LDLCALC", "TRIG", "CHOLHDL", "LDLDIRECT" in the last 72 hours. Thyroid Function Tests: No results for input(s): "TSH", "T4TOTAL", "FREET4", "T3FREE", "THYROIDAB" in the last 72 hours. Anemia Panel: No results for input(s):  "VITAMINB12", "FOLATE", "FERRITIN", "TIBC", "IRON", "RETICCTPCT" in the last 72 hours. Sepsis Labs: No results for input(s): "PROCALCITON", "LATICACIDVEN" in the last 168 hours.  No results found for this or any previous visit (from the past 240 hour(s)).   Radiology Studies: MR BRAIN WO CONTRAST  Result Date: 03/30/2023 CLINICAL DATA:  History of seizures.  Altered mental status. EXAM: MRI HEAD WITHOUT CONTRAST TECHNIQUE: Multiplanar, multiecho pulse sequences of the brain and surrounding structures were obtained without intravenous contrast. COMPARISON:  MRI brain 01/26/2023.  Head CT 03/27/2023. FINDINGS: Brain: Scattered foci of acute and subacute infarction throughout both cerebral hemispheres, including the bilateral ACA, MCA and PCA territories. No acute hemorrhage or significant mass effect. Interval evolution of the left occipital and left cerebellar infarcts. Unchanged encephalomalacia along the left frontoparietal junction and right frontal operculum. Stable background of severe chronic small-vessel disease. No hydrocephalus or extra-axial collection. Vascular: Normal flow voids. Skull and upper cervical spine: Unchanged diffusely low marrow signal of the upper cervical spine. Sinuses/Orbits: No acute findings. Other: None. IMPRESSION: 1. Scattered foci of acute and subacute infarction throughout both cerebral hemispheres, including the bilateral ACA, MCA and PCA territories. No acute hemorrhage or significant mass effect. 2. Interval evolution of the left occipital and left cerebellar infarcts. 3. Unchanged diffusely low marrow signal of the upper cervical spine, which can be seen in the setting of anemia and/or renal osteodystrophy. Electronically Signed   By: Orvan Falconer M.D.   On: 03/30/2023 20:56    Scheduled Meds:  atorvastatin  40 mg Oral Daily   Chlorhexidine Gluconate Cloth  6 each Topical Q0600   cinacalcet  60 mg Oral Q supper   insulin aspart  0-6 Units Subcutaneous Q4H    levETIRAcetam  500 mg Oral BID   sevelamer carbonate  3,200 mg Oral TID WC   Warfarin - Pharmacist Dosing Inpatient   Does not apply q1600   Continuous Infusions:  heparin 1,300 Units/hr (04/01/23 0532)      LOS: 5 days    Time spent: 35 mins    Willeen Niece, MD [facility]   If 7PM-7AM, please contact night-coverage

## 2023-04-01 NOTE — Progress Notes (Signed)
Refused medication. Wants to take dialysis prior taking the medicine. Notified Dr. Tamsen Roers.

## 2023-04-01 NOTE — Care Management Important Message (Signed)
Important Message  Patient Details  Name: [name] [name] MRN: [id number] Date of Birth: May 07, 1981   Important Message Given:  Yes - Medicare IM     Dorena Bodo 04/01/2023, 3:16 PM

## 2023-04-01 NOTE — Progress Notes (Addendum)
Patient self remove IV. Catheter intact. Patient standing at bedside, gait unsteady patient assisted back to bed x 2 RN and tech  Notified MD.

## 2023-04-01 NOTE — Progress Notes (Signed)
Agitated and confused. Refused vital signs, refused cbg test. Notified Attending MD about the condition of the patient.

## 2023-04-01 NOTE — Plan of Care (Signed)
  Problem: Education: Goal: Knowledge of disease or condition will improve Outcome: Progressing   Problem: Coping: Goal: Will identify appropriate support needs Outcome: Progressing   Problem: Education: Goal: Knowledge of General Education information will improve Description: Including pain rating scale, medication(s)/side effects and non-pharmacologic comfort measures Outcome: Progressing   Problem: Clinical Measurements: Goal: Will remain free from infection Outcome: Progressing   Problem: Coping: Goal: Level of anxiety will decrease Outcome: Progressing   Problem: Safety: Goal: Ability to remain free from injury will improve Outcome: Progressing

## 2023-04-01 NOTE — Progress Notes (Signed)
Attempting to call phlebotomy at 1749 pm for the missed attempt to draw blood from the patient and there was no answer from the phlebotomy side. 1753 attempted to call but to no avail, 1800 attempted to call but no answer, 1822 attempted to call with success, phlebotomist came at 1830 and the patient refused to have the blood drawn.

## 2023-04-01 NOTE — Progress Notes (Signed)
PHARMACY - ANTICOAGULATION CONSULT NOTE  Pharmacy Consult for heparin bridge + warfarin Indication:  APLA syndrome  Allergies  Allergen Reactions   Zyrtec [Cetirizine] Swelling    FACIAL SWELLING, NOT CATEGORIZED   Cetirizine & Related Swelling    Patient Measurements: Height: 6\' 1"  (185.4 cm) Weight: 111.4 kg (245 lb 9.5 oz) IBW/kg (Calculated) : 79.9 Heparin Dosing Weight: 103 kg   Vital Signs: Temp: 97.9 F (36.6 C) (10/14 0311) Temp Source: Oral (10/14 0311) BP: 134/87 (10/14 0311) Pulse Rate: 100 (10/14 0311)  Labs: Recent Labs    03/29/23 0954 03/29/23 1327 03/30/23 0437 04/01/23 0406  HGB 12.6*  --  12.2* 13.3  HCT 39.3  --  38.2* 39.7  PLT 131*  --  130* 137*  LABPROT  --   --   --  14.3  INR  --   --   --  1.1  HEPARINUNFRC  --   --   --  0.21*  CREATININE  --  18.08* 15.40* 21.49*   Estimated Creatinine Clearance: 5.8 mL/min (A) (by C-G formula based on SCr of 21.49 mg/dL (H)).  Medical History: Past Medical History:  Diagnosis Date   ESRD on hemodialysis (HCC)    Hypertension    Lupus    Seizures (HCC)    Seizures (HCC)    Stroke (HCC)    57 or 42 years of age.     Wears glasses    Assessment: 42 yo M presented with seizures with HD. Patient has history antiphospholipid syndrome, leading to hypercoagulable state and causing hx of strokes. INR on admission 1.1 - most likely not taking Warfarin at home. Warfarin held and sq heparin initiated in interim. MRI showing multiple acute and subacute infarcts. Per MD, discussed with neurology and would like to start Warfarin and use heparin bridge until therapeutic.  INR 1.1 following 1st dose of warfarin 10/13. Hgb 13.3, plt 137. No s/sx of bleeding. I  Goal of Therapy:  Heparin level 0.3-0.5 units/ml INR 2.5-3.5 per neurology  Monitor platelets by anticoagulation protocol: Yes   Plan:  Continue heparin infusion at 1300 units/hr  Give warfarin 5 mg PO x1 dose Check heparin level in 8 hours and  daily while on heparin Check INR daily while on warfarin Continue to monitor H&H and platelets   Thank you for allowing pharmacy to be a part of this patient's care.  Thelma Barge, PharmD Clinical Pharmacist

## 2023-04-01 NOTE — TOC CM/SW Note (Signed)
Transition of Care [facility]) - Inpatient Brief Assessment   Patient Details  Name: [name] [name] MRN: [id number] Date of Birth: [birth year]  Transition of Care [facility]) CM/SW Contact:    Ronny Bacon, RN Phone Number: 04/01/2023, 2:32 PM   Clinical Narrative: Patient admitted for Seizure and CVA. Patient No TOC needs noted at this time.   Transition of Care Asessment: Insurance and Status: Insurance coverage has been reviewed Patient has primary care physician: Yes Home environment has been reviewed: UTA, patient having dialysis at bedside. Prior level of function:: UTA, patient having dialysis at bedside. Prior/Current Home Services:  (UTA) Social Determinants of Health Reivew:  (UTA)   Transition of care needs: no transition of care needs at this time

## 2023-04-01 NOTE — Progress Notes (Signed)
[facility] Progress Note  Subjective: seen in room. No new complaints.  For HD today  Vitals:   03/31/23 1920 03/31/23 2307 04/01/23 0309 04/01/23 0311  BP: (!) 141/100 (!) 144/93 (!) 122/99 134/87  Pulse: 76 93 96 100  Resp:  18  16  Temp: 97.7 F (36.5 C) 98.9 F (37.2 C) 98.8 F (37.1 C) 97.9 F (36.6 C)  TempSrc: Oral Oral Oral Oral  SpO2: 93% 98% 99% 99%  Weight:      Height:        Exam: Gen alert and conversant  No jvd or bruits Chest clear bilat to bases RRR no MRG Abd soft ntnd no mass or ascites +bs Ext no  LE edema Neuro as above    RIJ TDC intact      Renal-related home meds: - norvasc 10 - coreg 12.5 bid - sensipar 60mg  hs - eliquis 2.5 bid - midodrine 5mg  tid - renavite - renvela 4 ac tid      OP HD: [facility] MWF  4h   400/800   101.5kg   2/2 bath   RIJ TDC    Heparin none - last OP HD today 10/9, post wt 110.5kg (9kg over) - mircera 50 mcg IV q 2wks, last 10/2, due 10/16  - venofer 100mg  three times per week thru today - rocaltrol 2.0 mcg      CXR 10/09 - IMPRESSION: Cardiomegaly with vascular congestion and diffuse hazy pulmonary density suspicious for mild pulmonary edema.   Assessment/ Plan: Seizures - acute on chronic, takes Keppra at home. Getting keppra here. MRI with scattered infarcts - neurology implicates subtherapeutic INR.  H/o antiphospholipid syndrome - hx SLE, has been tried on multiple anticoagulants in the past but struggles w/ medication adherence.  On heparin/coumadin bridge currently.  ESRD - on HD MWF. Had full OP HD on day of admission. Had extra HD yesterday/ Thursday and regular HD Friday. Next HD today.  HTN/ BP - BP's high on admission, normal now.  Volume - CXR admit showed edema. 4 L off w/ HD Thursday, and no UF on Friday due to low BP's. Standing wts with HD.   Anemia esrd - Hb 11- 13, no esa needs. Next esa due 10/16.  MBD ckd - CCa is high, and phos is very high. Resumed binder and sensipar. Holding po  vdra due to [employer]. Renal diet.   Estill Bakes MD [facility] Pager [phone]   Recent Labs  Lab 03/29/23 1327 03/30/23 0437 04/01/23 0406  HGB  --  12.2* 13.3  ALBUMIN 3.7  --  3.8  CALCIUM 10.5* 10.6* 10.7*  PHOS 11.1* 9.9* 11.7*  CREATININE 18.08* 15.40* 21.49*  K 6.1* 5.4* 4.8   No results for input(s): "IRON", "TIBC", "FERRITIN" in the last 168 hours. Inpatient medications:  atorvastatin  40 mg Oral Daily   Chlorhexidine Gluconate Cloth  6 each Topical Q0600   cinacalcet  60 mg Oral Q supper   insulin aspart  0-6 Units Subcutaneous Q4H   levETIRAcetam  500 mg Oral BID   sevelamer carbonate  3,200 mg Oral TID WC   Warfarin - Pharmacist Dosing Inpatient   Does not apply q1600    heparin 1,300 Units/hr (04/01/23 0532)    acetaminophen, docusate sodium, hydrALAZINE, LORazepam, OLANZapine, ondansetron (ZOFRAN) IV, polyethylene glycol

## 2023-04-01 NOTE — Progress Notes (Signed)
PHARMACY - ANTICOAGULATION CONSULT NOTE  Pharmacy Consult for heparin bridge  Indication:  APLA syndrome  Allergies  Allergen Reactions   Zyrtec [Cetirizine] Swelling    FACIAL SWELLING, NOT CATEGORIZED   Cetirizine & Related Swelling    Patient Measurements: Height: 6\' 1"  (185.4 cm) Weight: 111.4 kg (245 lb 9.5 oz) IBW/kg (Calculated) : 79.9 Heparin Dosing Weight: 103.3 kg   Vital Signs: Temp: 97.9 F (36.6 C) (10/14 0311) Temp Source: Oral (10/14 0311) BP: 134/87 (10/14 0311) Pulse Rate: 100 (10/14 0311)  Labs: Recent Labs    03/29/23 0954 03/29/23 1327 03/30/23 0437 04/01/23 0406  HGB 12.6*  --  12.2* 13.3  HCT 39.3  --  38.2* 39.7  PLT 131*  --  130* 137*  LABPROT  --   --   --  14.3  INR  --   --   --  1.1  HEPARINUNFRC  --   --   --  0.21*  CREATININE  --  18.08* 15.40*  --     Estimated Creatinine Clearance: 8.1 mL/min (A) (by C-G formula based on SCr of 15.4 mg/dL (H)).   Medical History: Past Medical History:  Diagnosis Date   ESRD on hemodialysis (HCC)    Hypertension    Lupus    Seizures (HCC)    Seizures (HCC)    Stroke (HCC)    49 or 42 years of age.     Wears glasses     Medications:  Scheduled:   atorvastatin  40 mg Oral Daily   Chlorhexidine Gluconate Cloth  6 each Topical Q0600   cinacalcet  60 mg Oral Q supper   insulin aspart  0-6 Units Subcutaneous Q4H   levETIRAcetam  500 mg Oral BID   sevelamer carbonate  3,200 mg Oral TID WC   Warfarin - Pharmacist Dosing Inpatient   Does not apply q1600    Assessment: 42 yo M presented with seizures with HD. Patient has history antiphospholipid syndrome, leading to hypercoagulable state and causing hx of strokes. INR on admission 1.1 - seemed like not taking Warfarin at home. Warfarin held and sq heparin initiated in interim. MRI showing multiple acute and subacute infarcts. Per MD, discussed with neurology and would like to start Warfarin and use heparin bridge until therapeutic.  10/14  AM: heparin level returned at 0.21 on 1200 units/hr (therapeutic). Per RN, no issues with heparin infusion running continuously or signs/symptoms of bleeding. Last CBC shows Hgb improvement from 12>13 and plts low, stable ~130s   Goal of Therapy:  Heparin level 0.3-0.5 units/ml INR 2.5-3.5 per neurology  Monitor platelets by anticoagulation protocol: Yes   Plan:  Increase heparin infusion to 1300 units/hr  Order heparin level in 8 hours Order warfarin 5 mg tonight (as per previous PharmD note today) Monitor daily HL, CBC, and for s/sx of bleeding  Thank you for allowing pharmacy to participate in this patient's care,  Arabella Merles, PharmD. Clinical Pharmacist 04/01/2023 5:05 AM

## 2023-04-02 ENCOUNTER — Ambulatory Visit: Payer: 59 | Admitting: Neurology

## 2023-04-02 DIAGNOSIS — R569 Unspecified convulsions: Secondary | ICD-10-CM | POA: Diagnosis not present

## 2023-04-02 LAB — RENAL FUNCTION PANEL
Albumin: 3.5 g/dL (ref 3.5–5.0)
Anion gap: 27 — ABNORMAL HIGH (ref 5–15)
BUN: 120 mg/dL — ABNORMAL HIGH (ref 6–20)
CO2: 21 mmol/L — ABNORMAL LOW (ref 22–32)
Calcium: 9.7 mg/dL (ref 8.9–10.3)
Chloride: 88 mmol/L — ABNORMAL LOW (ref 98–111)
Creatinine, Ser: 23.78 mg/dL — ABNORMAL HIGH (ref 0.61–1.24)
GFR, Estimated: 2 mL/min — ABNORMAL LOW (ref >60)
Glucose, Bld: 93 mg/dL (ref 70–99)
Phosphorus: >30 mg/dL — ABNORMAL HIGH (ref 2.5–4.6)
Potassium: 5.5 mmol/L — ABNORMAL HIGH (ref 3.5–5.1)
Sodium: 136 mmol/L (ref 135–145)

## 2023-04-02 LAB — GLUCOSE, CAPILLARY
Glucose-Capillary: 96 mg/dL (ref 70–99)
Glucose-Capillary: 83 mg/dL (ref 70–99)
Glucose-Capillary: 112 mg/dL — ABNORMAL HIGH (ref 70–99)
Glucose-Capillary: 97 mg/dL (ref 70–99)
Glucose-Capillary: 107 mg/dL — ABNORMAL HIGH (ref 70–99)

## 2023-04-02 LAB — CBC
HCT: 34.2 % — ABNORMAL LOW (ref 39.0–52.0)
Hemoglobin: 11.1 g/dL — ABNORMAL LOW (ref 13.0–17.0)
MCH: 29.9 pg (ref 26.0–34.0)
MCHC: 32.5 g/dL (ref 30.0–36.0)
MCV: 92.2 fL (ref 80.0–100.0)
Platelets: 114 10*3/uL — ABNORMAL LOW (ref 150–400)
RBC: 3.71 MIL/uL — ABNORMAL LOW (ref 4.22–5.81)
RDW: 14.1 % (ref 11.5–15.5)
WBC: 8.9 10*3/uL (ref 4.0–10.5)
nRBC: 0 % (ref 0.0–0.2)

## 2023-04-02 LAB — PROTIME-INR
INR: 1.2 (ref 0.8–1.2)
Prothrombin Time: 15.1 s (ref 11.4–15.2)

## 2023-04-02 LAB — MAGNESIUM: Magnesium: 3 mg/dL — ABNORMAL HIGH (ref 1.7–2.4)

## 2023-04-02 LAB — HEPARIN LEVEL (UNFRACTIONATED): Heparin Unfractionated: >1.1 [IU]/mL — ABNORMAL HIGH (ref 0.30–0.70)

## 2023-04-02 MED ORDER — HEPARIN SODIUM (PORCINE) 1000 UNIT/ML DIALYSIS
1000.0000 [IU] | INTRAMUSCULAR | Status: DC | PRN
  Administered 2023-04-02: 3200 [IU]
  Filled 2023-04-02: qty 1

## 2023-04-02 MED ORDER — HEPARIN (PORCINE) 25000 UT/250ML-% IV SOLN
1800.0000 [IU]/h | INTRAVENOUS | Status: DC
  Administered 2023-04-02 – 2023-04-03 (×2): 1200 [IU]/h via INTRAVENOUS
  Administered 2023-04-04: 1500 [IU]/h via INTRAVENOUS
  Administered 2023-04-04 – 2023-04-07 (×4): 1600 [IU]/h via INTRAVENOUS
  Administered 2023-04-08: 1700 [IU]/h via INTRAVENOUS
  Administered 2023-04-08: 1800 [IU]/h via INTRAVENOUS
  Filled 2023-04-02 (×8): qty 250

## 2023-04-02 MED ORDER — WARFARIN SODIUM 7.5 MG PO TABS
7.5000 mg | ORAL_TABLET | Freq: Once | ORAL | Status: AC
  Administered 2023-04-02: 7.5 mg via ORAL
  Filled 2023-04-02 (×2): qty 1

## 2023-04-02 NOTE — Progress Notes (Signed)
Patient returned from Dialysis.

## 2023-04-02 NOTE — Progress Notes (Signed)
Patient Left the unit with transport for hemodialysis.

## 2023-04-02 NOTE — Progress Notes (Addendum)
HD Progress Note:   04/02/23 1129  Vitals  Temp (!) 97.1 F (36.2 C)  Temp Source Oral  BP 107/71  MAP (mmHg) 80  BP Location Right Arm  BP Method Automatic  Patient Position (if appropriate) Lying  Pulse Rate 94  Pulse Rate Source Monitor  ECG Heart Rate 91  Resp 18  Oxygen Therapy  SpO2 99 %  O2 Device Room Air  Patient Activity (if Appropriate) In bed  Pulse Oximetry Type Continuous  During Treatment Monitoring  HD Safety Checks Performed Yes  Intra-Hemodialysis Comments Tx completed;Tolerated well  Dialysis Fluid Bolus Normal Saline  Bolus Amount (mL) 300 mL  Post Treatment  Dialyzer Clearance Lightly streaked  Hemodialysis Intake (mL) 0 mL  Liters Processed 58.4  Fluid Removed (mL) 1.9 mL  Tolerated HD Treatment Yes  Post-Hemodialysis Comments Stable.  AVG/AVF Arterial Site Held (minutes) 0 minutes  AVG/AVF Venous Site Held (minutes) 0 minutes  Hemodialysis Catheter Right Internal jugular Double lumen Permanent (Tunneled)  Placement Date/Time: 07/16/22 1625   Serial / Lot #: [id number]  Expiration Date: 03/21/27  Time Out: Correct patient;Correct site;Correct procedure  Maximum sterile barrier precautions: Hand hygiene;Cap;Mask;Sterile gown;Sterile gloves;Large sterile ...  Site Condition No complications  Blue Lumen Status Flushed;Heparin locked;Dead end cap in place  Red Lumen Status Flushed;Heparin locked;Dead end cap in place  Purple Lumen Status N/A  Catheter fill solution Heparin 1000 units/ml  Catheter fill volume (Arterial) 1.6 cc  Catheter fill volume (Venous) 1.6  Dressing Type Transparent  Dressing Status Antimicrobial disc in place  Interventions New dressing  Drainage Description None  Dressing Change Due 04/09/23  Post treatment catheter status Capped and Clamped    UF removed (net) = (initial goal was 2.5L). Had 1 time hypotensive episode - needed to decreased his UF goal and paused UF.

## 2023-04-02 NOTE — Plan of Care (Signed)
Problem: Education: Goal: Knowledge of disease or condition will improve Outcome: Progressing   Problem: Coping: Goal: Will identify appropriate support needs Outcome: Progressing

## 2023-04-02 NOTE — Progress Notes (Signed)
PROGRESS NOTE    [name] [name]  ZOX:[id number] DOB: 03/12/1981 DOA: 03/27/2023 PCP: Claiborne Rigg, NP   Brief Narrative:  This 42 years old male with PMH significant for essential hypertension, ESRD on hemodialysis, SLE with APLA previously on Eliquis, CVA, chronic thrombocytopenia, anemia of chronic disease, history of seizures, history of medication noncompliance presented to the ED from dialysis for not acting himself during dialysis.  Patient was found to have active seizures in the ER with tongue biting and was hypoxic with SpO2 of 60% on room air which improved to 100% on nonrebreather and was treated with IV Ativan.  Patient remained postictal in the ER occasionally agitated.  He was loaded with Keppra.  Neurology was consulted.  CT head without acute bleed but possibly shows new stroke.  Patient had multiple ER visits for breakthrough seizures.  MRI showed acute infarct of the left PCA territory, no hemorrhage or mass effect.  Patient was admitted in the ICU. 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 Patient was awake and oriented, following commands at admission. No further seizure activity, EEG w/o seizure activity.  Continue Keppra 500 BID Continue Ativan prn for breakthrough seizure. MRI showed multiple acute and subacute infarcts. Continue Olanzapine prn for agitation Passed bedside swallow eval.  Started on renal diet. Continue Sz precautions. Neurology consult appreciated. Patient does miss about 3-4 doses of Keppra a week. Will reassure that he will set up alarm or talk to his friend to remind him to take his Keppra.   Neurologist recommended to increase compliance,  not to make changes in the Keppra dosing. Patient resumed on coumadin.   Acute hypoxic respiratory failure: Likely multifactorial. Currently on 2L [place]. Spo2 94% Wean O2 as  tolerated. Continue hemodialysis as per nephrology.   ESRD on MWF HD Nephrology consulted, appreciate recs.  Monitor intake output, daily weight. Continue HD as per schedule.  Next hemodialysis tomorrow..  Essential hypertension: Continue hydralazine as needed. Blood pressure improved with hemodialysis. If BP consistently elevated after HD, consider restarting Coreg.   Antiphospholipid Syndrome  SLE  Poor Med Adherence: Pt has hx. of antiphospholipid syndrome, leading to hypercoagulable state and causing hx of strokes.  Patient has tried multiple anticoagulants including warfarin, eliquis, xarelto, but struggles w/ med adherence.  Looks like he was most recently taking Warfarin, but INR on admission suggests pt was not taking regularly. Although warfarin is first line for antiphopsolipid syndrome, given pt has poor follow-up and poor med adherence, Eliquis may be a better option.  Discussed with Neurology , Patient resumed on coumadin.    Anemia of chronic disease: Chronic thrombocytopenia  Leukocytosis Pt has leukocytosis, which is downtrending since admission.  Could be reactive 2/2 seizure or aspiration pneumonitis.  He remains afebrile and no signs of infxn at this time, will hold antibiotics and trend CBC. - trend CBC, transfuse for Hgb < 7   DVT prophylaxis:  Heparin + coumadin Code Status: Full code Family Communication: No family at bed side. Disposition Plan:    Status is: Inpatient Remains inpatient appropriate because: Admitted for stroke and seizure disorder.  Nephrology consulted for continuation of hemodialysis.  Patient resumed on Coumadin.  Anticipated discharge once INR becomes therapeutic   Consultants:  PCCM Neurology Nephrology  Procedures: MRI Brain  Antimicrobials:  Anti-infectives (From admission, onward)    Start     Dose/Rate Route Frequency Ordered  Stop   03/27/23 1545  cefTRIAXone (ROCEPHIN) 1 g in sodium chloride 0.9 % 100 mL IVPB        1  g 200 mL/hr over 30 Minutes Intravenous  Once 03/27/23 1544 03/27/23 1740   03/27/23 1545  azithromycin (ZITHROMAX) 500 mg in sodium chloride 0.9 % 250 mL IVPB  Status:  Discontinued        500 mg 250 mL/hr over 60 Minutes Intravenous  Once 03/27/23 1544 03/27/23 1727      Subjective: Patient was seen and examined at bedside. Overnight events noted.   Patient was seen in hemodialysis suite.  He reports feeling better,  asking when he will be discharged. .  Objective: Vitals:   04/02/23 1030 04/02/23 1100 04/02/23 1129 04/02/23 1158  BP: (!) 132/102 98/88 107/71 (!) 129/90  Pulse: 84 89 94 80  Resp: 14 13 18 18   Temp:   (!) 97.1 F (36.2 C) 98.2 F (36.8 C)  TempSrc:   Oral Oral  SpO2: 99% 95% 99% 100%  Weight:   104.2 kg   Height:        Intake/Output Summary (Last 24 hours) at 04/02/2023 1440 Last data filed at 04/02/2023 1359 Gross per 24 hour  Intake 120 ml  Output 1900 ml  Net -1780 ml   Filed Weights   03/31/23 0500 04/02/23 0747 04/02/23 1129  Weight: 111.4 kg 106.3 kg 104.2 kg    Examination:  General exam: Appears comfortable,  deconditioned, not in any acute distress. Respiratory system: CTA bilaterally. Respiratory effort normal.  RR 13 Cardiovascular system: S1 & S2 heard, RRR. No murmur.  No pedal edema. Gastrointestinal system: Abdomen is non distended, soft and non tender.  Normal bowel sounds heard. Central nervous system: Alert and oriented x 3. No focal neurological deficits. Extremities: No edema, no cyanosis, no clubbing Skin: No rashes, lesions or ulcers Psychiatry: Judgement and insight appear normal. Mood & affect appropriate.     Data Reviewed: I have personally reviewed following labs and imaging studies  CBC: Recent Labs  Lab 03/27/23 1329 03/27/23 1340 03/28/23 0316 03/29/23 0954 03/30/23 0437 04/01/23 0406 04/02/23 0800  WBC 13.4*  --  11.6* 13.6* 16.1* 13.1* 8.9  NEUTROABS 9.4*  --   --   --   --   --   --   HGB 11.5*   <  > 11.0* 12.6* 12.2* 13.3 11.1*  HCT 37.0*   < > 33.5* 39.3 38.2* 39.7 34.2*  MCV 100.3*  --  94.4 93.6 92.9 92.5 92.2  PLT 125*  --  114* 131* 130* 137* 114*   < > = values in this interval not displayed.   Basic Metabolic Panel: Recent Labs  Lab 03/28/23 0316 03/29/23 1327 03/30/23 0437 04/01/23 0406 04/02/23 0800  NA 137 137 136 136 136  K 4.4 6.1* 5.4* 4.8 5.5*  CL 95* 91* 94* 89* 88*  CO2 26 24 21* 22 21*  GLUCOSE 102* 111* 109* 102* 93  BUN 33* 74* 69* 107* 120*  CREATININE 13.59* 18.08* 15.40* 21.49* 23.78*  CALCIUM 10.0 10.5* 10.6* 10.7* 9.7  MG 2.4  --  2.7*  --  3.0*  PHOS  --  11.1* 9.9* 11.7* 30.0*   GFR: Estimated Creatinine Clearance: 5.1 mL/min (A) (by C-G formula based on SCr of 23.78 mg/dL (H)). Liver Function Tests: Recent Labs  Lab 03/27/23 1329 03/29/23 1327 04/01/23 0406 04/02/23 0800  AST 23  --   --   --   ALT  12  --   --   --   ALKPHOS 89  --   --   --   BILITOT 1.0  --   --   --   PROT 9.0*  --   --   --   ALBUMIN 3.9 3.7 3.8 3.5   No results for input(s): "LIPASE", "AMYLASE" in the last 168 hours. No results for input(s): "AMMONIA" in the last 168 hours. Coagulation Profile: Recent Labs  Lab 03/27/23 1329 04/01/23 0406 04/02/23 0800  INR 1.1 1.1 1.2   Cardiac Enzymes: No results for input(s): "CKTOTAL", "CKMB", "CKMBINDEX", "TROPONINI" in the last 168 hours. BNP (last 3 results) No results for input(s): "PROBNP" in the last 8760 hours. HbA1C: No results for input(s): "HGBA1C" in the last 72 hours. CBG: Recent Labs  Lab 04/01/23 1523 04/01/23 1957 04/01/23 2319 04/02/23 0329 04/02/23 1206  GLUCAP 137* 100* 101* 96 83   Lipid Profile: No results for input(s): "CHOL", "HDL", "LDLCALC", "TRIG", "CHOLHDL", "LDLDIRECT" in the last 72 hours. Thyroid Function Tests: No results for input(s): "TSH", "T4TOTAL", "FREET4", "T3FREE", "THYROIDAB" in the last 72 hours. Anemia Panel: No results for input(s): "VITAMINB12", "FOLATE",  "FERRITIN", "TIBC", "IRON", "RETICCTPCT" in the last 72 hours. Sepsis Labs: No results for input(s): "PROCALCITON", "LATICACIDVEN" in the last 168 hours.  No results found for this or any previous visit (from the past 240 hour(s)).   Radiology Studies: No results found.  Scheduled Meds:  atorvastatin  40 mg Oral Daily   Chlorhexidine Gluconate Cloth  6 each Topical Q0600   cinacalcet  60 mg Oral Q supper   insulin aspart  0-6 Units Subcutaneous Q4H   levETIRAcetam  500 mg Oral BID   sevelamer carbonate  3,200 mg Oral TID WC   warfarin  7.5 mg Oral ONCE-1600   Warfarin - Pharmacist Dosing Inpatient   Does not apply q1600   Continuous Infusions:  heparin 1,200 Units/hr (04/02/23 1257)      LOS: 6 days    Time spent: 35 mins    Willeen Niece, MD [facility]   If 7PM-7AM, please contact night-coverage

## 2023-04-02 NOTE — Progress Notes (Signed)
[facility] Progress Note  Subjective: seen on HD.  Dialysis rolled over from yesterday. Has sitter now which is new.  Nothing documented in chart as to event that led to this.  Currently resting calmly.   Vitals:   04/02/23 0830 04/02/23 0900 04/02/23 0930 04/02/23 1000  BP: 96/66 (!) 79/57 (!) 127/90 121/79  Pulse: 82 77 85 77  Resp: 14 13 12 12   Temp:      TempSrc:      SpO2: 100% 94% 94% 94%  Weight:      Height:        Exam: Gen sleeping peacefully on HD No jvd or bruits Chest clear bilat to bases RRR no MRG Abd soft ntnd no mass or ascites +bs Ext no  LE edema Neuro as above    RIJ TDC intact      Renal-related home meds: - norvasc 10 - coreg 12.5 bid - sensipar 60mg  hs - eliquis 2.5 bid - midodrine 5mg  tid - renavite - renvela 4 ac tid      OP HD: [facility] MWF  4h   400/800   101.5kg   2/2 bath   RIJ TDC    Heparin none - last OP HD today 10/9, post wt 110.5kg (9kg over) - mircera 50 mcg IV q 2wks, last 10/2, due 10/16  - venofer 100mg  three times per week thru today - rocaltrol 2.0 mcg      CXR 10/09 - IMPRESSION: Cardiomegaly with vascular congestion and diffuse hazy pulmonary density suspicious for mild pulmonary edema.   Assessment/ Plan: Seizures - acute on chronic, takes Keppra at home. Getting keppra here. MRI with scattered infarcts - neurology implicates subtherapeutic INR.  H/o antiphospholipid syndrome - hx SLE, has been tried on multiple anticoagulants in the past but struggles w/ medication adherence.  On heparin/coumadin bridge currently.  ESRD - on HD MWF. HD today as rollover from yesterday. Next HD tomorrow.  HTN/ BP - BP's high on admission, normal now.  Volume - CXR admit showed edema now improved.  UF 2L today. Anemia esrd - Hb 11- 13, no esa needs. Next esa due 10/16.  MBD ckd - CCa normalized, and phos is very high. Resumed binder and sensipar. Holding po vdra due to [employer]. Renal diet.  Hyperkalemia - 2K dialysate  today.  Prior to discharge needs to be able to tolerate HD without sitter - this is new today, will look into.    Estill Bakes MD [facility] Pager [phone]   Recent Labs  Lab 04/01/23 0406 04/02/23 0800  HGB 13.3 11.1*  ALBUMIN 3.8 3.5  CALCIUM 10.7* 9.7  PHOS 11.7* 30.0*  CREATININE 21.49* 23.78*  K 4.8 5.5*   No results for input(s): "IRON", "TIBC", "FERRITIN" in the last 168 hours. Inpatient medications:  atorvastatin  40 mg Oral Daily   Chlorhexidine Gluconate Cloth  6 each Topical Q0600   cinacalcet  60 mg Oral Q supper   insulin aspart  0-6 Units Subcutaneous Q4H   levETIRAcetam  500 mg Oral BID   sevelamer carbonate  3,200 mg Oral TID WC   warfarin  7.5 mg Oral ONCE-1600   Warfarin - Pharmacist Dosing Inpatient   Does not apply q1600    heparin Stopped (04/02/23 0951)    acetaminophen, docusate sodium, heparin, hydrALAZINE, LORazepam, OLANZapine, ondansetron (ZOFRAN) IV, polyethylene glycol

## 2023-04-02 NOTE — Progress Notes (Signed)
This nurse was called to bedside by lab tech due to patient refusing to have PTT, I explained to [name] [name] the importance of drawing this lab due to receiving heparin continuously and we need to ensure he is not getting too much or too little. Patient stated ok but it won't be tonight I'm not letting anyone stick me tonight.

## 2023-04-02 NOTE — Progress Notes (Addendum)
PHARMACY - ANTICOAGULATION CONSULT NOTE  Pharmacy Consult for heparin bridge + warfarin Indication:  APLA syndrome  Allergies  Allergen Reactions   Zyrtec [Cetirizine] Swelling    FACIAL SWELLING, NOT CATEGORIZED   Cetirizine & Related Swelling    Patient Measurements: Height: 6\' 1"  (185.4 cm) Weight: 104.2 kg (229 lb 11.5 oz) IBW/kg (Calculated) : 79.9 Heparin Dosing Weight: 103 kg   Vital Signs: Temp: 98.2 F (36.8 C) (10/15 1158) Temp Source: Oral (10/15 1158) BP: 129/90 (10/15 1158) Pulse Rate: 80 (10/15 1158)  Labs: Recent Labs    04/01/23 0406 04/02/23 0500 04/02/23 0800  HGB 13.3  --  11.1*  HCT 39.7  --  34.2*  PLT 137*  --  114*  LABPROT 14.3  --  15.1  INR 1.1  --  1.2  HEPARINUNFRC 0.21* >1.10*  --   CREATININE 21.49*  --  23.78*   Estimated Creatinine Clearance: 5.1 mL/min (A) (by C-G formula based on SCr of 23.78 mg/dL (H)).  Medical History: Past Medical History:  Diagnosis Date   ESRD on hemodialysis (HCC)    Hypertension    Lupus    Seizures (HCC)    Seizures (HCC)    Stroke (HCC)    42 or 42 years of age.     Wears glasses    Assessment: 42 yo M presented with seizures with HD. Patient has history antiphospholipid syndrome, leading to hypercoagulable state and causing hx of strokes. INR on admission 1.1 - most likely not taking Warfarin at home. Warfarin held and sq heparin initiated in interim. MRI showing multiple acute and subacute infarcts. Per MD, discussed with neurology and would like to start Warfarin and use heparin bridge until therapeutic.  INR 1.2 following x2 doses of warfarin 5mg . Heparin level above goal, confirmed with RN, drawn from cath while in HD, heparin infusion running in separate PIV. No other issues with heparin infusion or signs of bleeding reported. Patient refusing multiple labs.   Goal of Therapy:  Heparin level 0.3-0.5 units/ml INR 2.5-3.5 per neurology  Monitor platelets by anticoagulation protocol: Yes    Plan:  Hold heparin infusion for 1 hour  Resume heparin infusion at 1200 units/hr  Give warfarin 7.5 mg PO x1 dose Check heparin level in 8 hours and daily while on heparin Check INR daily while on warfarin Continue to monitor H&H and platelets   Thank you for allowing pharmacy to be a part of this patient's care.  Thelma Barge, PharmD Clinical Pharmacist

## 2023-04-03 ENCOUNTER — Inpatient Hospital Stay (HOSPITAL_COMMUNITY): Payer: 59

## 2023-04-03 DIAGNOSIS — R569 Unspecified convulsions: Secondary | ICD-10-CM | POA: Diagnosis not present

## 2023-04-03 DIAGNOSIS — G9340 Encephalopathy, unspecified: Secondary | ICD-10-CM | POA: Diagnosis not present

## 2023-04-03 LAB — HEPARIN LEVEL (UNFRACTIONATED)
Heparin Unfractionated: 0.16 [IU]/mL — ABNORMAL LOW (ref 0.30–0.70)
Heparin Unfractionated: 0.15 [IU]/mL — ABNORMAL LOW (ref 0.30–0.70)
Heparin Unfractionated: 0.18 [IU]/mL — ABNORMAL LOW (ref 0.30–0.70)

## 2023-04-03 LAB — CBC
HCT: 38 % — ABNORMAL LOW (ref 39.0–52.0)
Hemoglobin: 12.3 g/dL — ABNORMAL LOW (ref 13.0–17.0)
MCH: 29.9 pg (ref 26.0–34.0)
MCHC: 32.4 g/dL (ref 30.0–36.0)
MCV: 92.5 fL (ref 80.0–100.0)
Platelets: 108 10*3/uL — ABNORMAL LOW (ref 150–400)
RBC: 4.11 MIL/uL — ABNORMAL LOW (ref 4.22–5.81)
RDW: 14.3 % (ref 11.5–15.5)
WBC: 6.8 10*3/uL (ref 4.0–10.5)
nRBC: 0 % (ref 0.0–0.2)

## 2023-04-03 LAB — GLUCOSE, CAPILLARY
Glucose-Capillary: 94 mg/dL (ref 70–99)
Glucose-Capillary: 93 mg/dL (ref 70–99)
Glucose-Capillary: 105 mg/dL — ABNORMAL HIGH (ref 70–99)
Glucose-Capillary: 114 mg/dL — ABNORMAL HIGH (ref 70–99)

## 2023-04-03 LAB — PROTIME-INR
INR: 1.2 (ref 0.8–1.2)
INR: 1.2 (ref 0.8–1.2)
Prothrombin Time: 15.3 s — ABNORMAL HIGH (ref 11.4–15.2)
Prothrombin Time: 15.4 s — ABNORMAL HIGH (ref 11.4–15.2)

## 2023-04-03 LAB — BASIC METABOLIC PANEL
Anion gap: 22 — ABNORMAL HIGH (ref 5–15)
BUN: 81 mg/dL — ABNORMAL HIGH (ref 6–20)
CO2: 23 mmol/L (ref 22–32)
Calcium: 10 mg/dL (ref 8.9–10.3)
Chloride: 88 mmol/L — ABNORMAL LOW (ref 98–111)
Creatinine, Ser: 17.63 mg/dL — ABNORMAL HIGH (ref 0.61–1.24)
GFR, Estimated: 3 mL/min — ABNORMAL LOW (ref >60)
Glucose, Bld: 86 mg/dL (ref 70–99)
Potassium: 5.1 mmol/L (ref 3.5–5.1)
Sodium: 133 mmol/L — ABNORMAL LOW (ref 135–145)

## 2023-04-03 LAB — PHOSPHORUS: Phosphorus: 10.9 mg/dL — ABNORMAL HIGH (ref 2.5–4.6)

## 2023-04-03 LAB — MAGNESIUM: Magnesium: 2.5 mg/dL — ABNORMAL HIGH (ref 1.7–2.4)

## 2023-04-03 MED ORDER — ANTICOAGULANT SODIUM CITRATE 4% (200MG/5ML) IV SOLN
5.0000 mL | Status: DC | PRN
  Administered 2023-04-04: 5 mL
  Filled 2023-04-03: qty 5

## 2023-04-03 MED ORDER — NEPRO/CARBSTEADY PO LIQD: 237.0000 mL | ORAL | Status: DC | PRN

## 2023-04-03 MED ORDER — WARFARIN SODIUM 7.5 MG PO TABS
7.5000 mg | ORAL_TABLET | Freq: Once | ORAL | Status: AC
  Administered 2023-04-03: 7.5 mg via ORAL
  Filled 2023-04-03: qty 1

## 2023-04-03 MED ORDER — LIDOCAINE-PRILOCAINE 2.5-2.5 % EX CREA: 1.0000 | TOPICAL_CREAM | CUTANEOUS | Status: DC | PRN

## 2023-04-03 MED ORDER — ALTEPLASE 2 MG IJ SOLR: 2.0000 mg | Freq: Once | INTRAMUSCULAR | Status: DC | PRN

## 2023-04-03 MED ORDER — PENTAFLUOROPROP-TETRAFLUOROETH EX AERO: 1.0000 | INHALATION_SPRAY | CUTANEOUS | Status: DC | PRN

## 2023-04-03 MED ORDER — LIDOCAINE HCL (PF) 1 % IJ SOLN: 5.0000 mL | INTRAMUSCULAR | Status: DC | PRN

## 2023-04-03 NOTE — Progress Notes (Signed)
1      PROGRESS NOTE   [name] [name]  ZOX:[id number]    DOB: 06-Apr-1981    DOA: 03/27/2023  PCP: Claiborne Rigg, NP   I have briefly reviewed patients previous medical records in [facility].  Chief Complaint  Patient presents with   Altered Mental Status   Hypertension    Brief Hospital Course:  42 years old male with PMH significant for essential hypertension, ESRD on hemodialysis, SLE with APLA previously on Eliquis, CVA, chronic thrombocytopenia, anemia of chronic disease, history of seizures, history of medication noncompliance presented to the ED from dialysis for not acting himself during dialysis.  Patient was found to have active seizures in the ER with tongue biting and was hypoxic with SpO2 of 60% on room air which improved to 100% on nonrebreather and was treated with IV Ativan.  Patient remained postictal in the ER occasionally agitated.  He was loaded with Keppra.  Neurology was consulted.  CT head without acute bleed but possibly shows new stroke.  Patient had multiple ER visits for breakthrough seizures.  MRI showed acute infarct of the left PCA territory, no hemorrhage or mass effect.  Patient was admitted in the ICU. TRH pickup 03/29/2023     Assessment & Plan:  Principal Problem:   Seizure (HCC)   Seizure with postictal encephalopathy/history of seizures: History of complex partial seizures. Multiple ED visits for breakthrough seizures.  Questionable compliance-reportedly misses about 3-4 [place] of Keppra per week. Admitted for seizure and aspiration pneumonitis.  Received Ativan and Keppra load EEG showed moderate to diffuse encephalopathy, no seizures or epileptiform discharges. Continue Keppra 500 Mg twice daily. Seizure precautions.  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 Prior left PCA infarct in August 2024.  Carotid Dopplers at that time unremarkable.  Had been discharged on Eliquis and Lipitor. MRI showed multiple  acute and subacute infarcts.  Per neurology, likely secondary to antiphospholipid syndrome on Coumadin with subtherapeutic INR and noncompliance. Noncompliant to warfarin, now on warfarin with heparin IV bridge. Compliance has been counseled this admission. Continue Olanzapine prn for agitation Do not see a PT or OT order, just placed the order.  Acute hypoxic respiratory failure: Likely multifactorial. Currently on 2L [place]. Spo2 94% Wean O2 as tolerated. Continue hemodialysis as per nephrology.   ESRD on MWF HD/hyperkalemia Nephrology follow-up appreciated. HD on 10/16 as a rollover from yesterday and again next HD on 10/17. Potassium 5.5 on 10/15, improved to 5.1.  Metabolic bone disease Significant hyperphosphatemia >30.  Nephrology resuming binder and Sensipar.   Essential hypertension: Controlled on as needed hydralazine.  Hyperlipidemia: Continue Lipitor, again without compliance.  Anemia in ESRD: Stable.  Thrombocytopenia Appears new compared to August 2024.  Fluctuating but relatively stable.  Acute encephalopathy: Per RN report, patient reportedly was up late last night, confused and agitated.  Etiology unclear.  Do not see that he received any sedative meds that could be contributing.  This morning somnolent but arousable, however not interactive.  Need to monitor closely and if does not improve or worsens may need further evaluation including repeat EEG.  Has a Recruitment consultant in place. Given that he is on IV heparin drip, will get CT head to rule out bleed.  Also ordered EEG. ??  Uremia.   Antiphospholipid Syndrome  SLE  Poor Med Adherence: Patient has tried multiple anticoagulants including warfarin, eliquis, xarelto, but struggles w/ med adherence.  Looks like he was most recently taking Warfarin, but INR  on admission suggests pt was not taking regularly. Although warfarin is first line for antiphopsolipid syndrome, given pt has poor follow-up and poor med  adherence, Eliquis may be a better option.  Discussed with Neurology , Patient resumed on coumadin.  Appears that he has been noncompliant with several meds. As per neurology note, given patient's ESRD on dialysis and antiphospholipid syndrome, Coumadin may be the best option and he may not have much choices. Currently on IV heparin bridge and Coumadin.  INR 1.2.  Goal 2.5-3.5 and may take several days to get there.  Body mass index is 30.31 kg/m.     DVT prophylaxis: SCDs Start: 03/27/23 1703     Code Status: Full Code:  Family Communication: None at bedside. Disposition:  Status is: Inpatient Remains inpatient appropriate because: Subtherapeutic INR, heparin bridge, mental status changes.     Consultants:   Nephrology Neurology  Procedures:     Antimicrobials:      Subjective:  Seen this morning.  Safety sitter at bedside.  Patient somnolent, after multiple calls, briefly opens eyes, oriented to self and place, states that he feels okay and denies complaints.  Drifts back to sleep.  Per nursing report, reportedly up late in the night, confused and agitated.  However sitter informed me that he was awake this morning, had his breakfast and went to sleep at 10 AM.  Objective:   Vitals:   04/03/23 0730 04/03/23 0818 04/03/23 1214 04/03/23 1215  BP: (!) 127/102 (!) 126/90 111/79 114/83  Pulse: 65 76 66 67  Resp: 18  12 19   Temp: 97.8 F (36.6 C)  98.6 F (37 C) 98.6 F (37 C)  TempSrc: Oral  Oral Oral  SpO2: 97%  99% 93%  Weight:      Height:        General exam: Young male, moderately built and nourished lying comfortably propped up in bed without distress. Respiratory system: Clear to auscultation. Respiratory effort normal. Cardiovascular system: S1 & S2 heard, RRR. No JVD, murmurs, rubs, gallops or clicks. No pedal edema.  Telemetry personally reviewed: Sinus rhythm. Gastrointestinal system: Abdomen is nondistended, soft and nontender. No organomegaly or masses  felt. Normal bowel sounds heard. Central nervous system: Mental status is as noted above. No focal neurological deficits. Extremities: Symmetric 5 x 5 power. Skin: No rashes, lesions or ulcers Psychiatry: Judgement and insight impaired. Mood & affect cannot be assessed at this time.     Data Reviewed:   I have personally reviewed following labs and imaging studies   CBC: Recent Labs  Lab 04/01/23 0406 04/02/23 0800 04/03/23 0738  WBC 13.1* 8.9 6.8  HGB 13.3 11.1* 12.3*  HCT 39.7 34.2* 38.0*  MCV 92.5 92.2 92.5  PLT 137* 114* 108*    Basic Metabolic Panel: Recent Labs  Lab 03/28/23 0316 03/29/23 1327 03/30/23 0437 04/01/23 0406 04/02/23 0800 04/03/23 0601  NA 137 137 136 136 136 133*  K 4.4 6.1* 5.4* 4.8 5.5* 5.1  CL 95* 91* 94* 89* 88* 88*  CO2 26 24 21* 22 21* 23  GLUCOSE 102* 111* 109* 102* 93 86  BUN 33* 74* 69* 107* 120* 81*  CREATININE 13.59* 18.08* 15.40* 21.49* 23.78* 17.63*  CALCIUM 10.0 10.5* 10.6* 10.7* 9.7 10.0  MG 2.4  --  2.7*  --  3.0* 2.5*  PHOS  --  11.1* 9.9* 11.7* >30.0* 10.9*    Liver Function Tests: Recent Labs  Lab 03/29/23 1327 04/01/23 0406 04/02/23 0800  ALBUMIN 3.7 3.8  3.5    CBG: Recent Labs  Lab 04/03/23 0338 04/03/23 0736 04/03/23 1208  GLUCAP 94 93 105*    Microbiology Studies:  No results found for this or any previous visit (from the past 240 hour(s)).  Radiology Studies:  No results found.  Scheduled Meds:    atorvastatin  40 mg Oral Daily   Chlorhexidine Gluconate Cloth  6 each Topical Q0600   cinacalcet  60 mg Oral Q supper   insulin aspart  0-6 Units Subcutaneous Q4H   levETIRAcetam  500 mg Oral BID   sevelamer carbonate  3,200 mg Oral TID WC   warfarin  7.5 mg Oral ONCE-1600   Warfarin - Pharmacist Dosing Inpatient   Does not apply q1600    Continuous Infusions:    anticoagulant sodium citrate     heparin 1,300 Units/hr (04/03/23 1126)     LOS: 7 days     Marcellus Scott, MD,  FACP, [facility],  [facility], [facility]   [facility] [place]      To contact the attending provider between 7A-7P or the covering provider during after hours 7P-7A, please log into the web site [email or web address] and access using universal [place] password for that web site. If you do not have the password, please call the hospital operator.  04/03/2023, 3:41 PM

## 2023-04-03 NOTE — Progress Notes (Signed)
MD aware of pt change, no new orders at this time. Per report from PM RN previous shift patient was up late last night, confused and agitated.

## 2023-04-03 NOTE — Progress Notes (Signed)
[facility] Progress Note  Subjective: seen in room.  Has sitter still - RN says having some agitation.  Pt currently resting calmly as he was at dialysis yesterday.  He is oriented to person, year, place, able to tell me living situation prior to admission.  He tolerated HD fine yesterday.   Vitals:   04/03/23 0020 04/03/23 0346 04/03/23 0730 04/03/23 0818  BP: (!) 146/112 129/81 (!) 127/102 (!) 126/90  Pulse: 83 75 65 76  Resp: 18 18 18    Temp: 98.1 F (36.7 C) 98.4 F (36.9 C) 97.8 F (36.6 C)   TempSrc: Oral  Oral   SpO2: 95% 94% 97%   Weight:      Height:        Exam: Gen sleeping peacefully on HD No jvd or bruits Chest clear bilat to bases RRR no MRG Abd soft ntnd no mass or ascites +bs Ext no  LE edema Neuro as above    RIJ TDC intact      Renal-related home meds: - norvasc 10 - coreg 12.5 bid - sensipar 60mg  hs - eliquis 2.5 bid - midodrine 5mg  tid - renavite - renvela 4 ac tid      OP HD: [facility] MWF  4h   400/800   101.5kg   2/2 bath   RIJ TDC    Heparin none - last OP HD today 10/9, post wt 110.5kg (9kg over) - mircera 50 mcg IV q 2wks, last 10/2, due 10/16  - venofer 100mg  three times per week thru today - rocaltrol 2.0 mcg      CXR 10/09 - IMPRESSION: Cardiomegaly with vascular congestion and diffuse hazy pulmonary density suspicious for mild pulmonary edema.   Assessment/ Plan: Seizures - acute on chronic, takes Keppra at home but misses doses. Getting keppra here. MRI with scattered infarcts - neurology implicates subtherapeutic INR.  Setting up alarms/reminders to take meds. H/o antiphospholipid syndrome - hx SLE, has been tried on multiple anticoagulants in the past but struggles w/ medication adherence.  On heparin/coumadin bridge currently.  ESRD - on HD MWF. HD Tues as rollover from [place]. Next HD tomorrow.  Will resume outpt schedule at some point - due to limited IVF supply related to supply chain limiting treatments done just to  "get back on schedule" HTN/ BP - BP's high on admission, normal/low now.  Volume - CXR admit showed edema now improved and on RA.  UF with HD. Anemia esrd - Hb 11- 13, no esa needs. Next esa due 10/16.  MBD ckd - CCa normalized, and phos is very high. Resumed binder and sensipar. Holding po vdra due to [employer]. Renal diet.  Hyperkalemia - resolved, on renal diet.  Prior to discharge needs to be able to tolerate HD without sitter - not clear to me if he's truly needing a sitter.   He did fine with HD yesterday and seems fine this AM.   Estill Bakes MD [facility] Pager [phone]   Recent Labs  Lab 04/01/23 0406 04/02/23 0800 04/03/23 0601 04/03/23 0738  HGB 13.3 11.1*  --  12.3*  ALBUMIN 3.8 3.5  --   --   CALCIUM 10.7* 9.7 10.0  --   PHOS 11.7* >30.0* 10.9*  --   CREATININE 21.49* 23.78* 17.63*  --   K 4.8 5.5* 5.1  --    No results for input(s): "IRON", "TIBC", "FERRITIN" in the last 168 hours. Inpatient medications:  atorvastatin  40 mg Oral Daily  Chlorhexidine Gluconate Cloth  6 each Topical Q0600   cinacalcet  60 mg Oral Q supper   insulin aspart  0-6 Units Subcutaneous Q4H   levETIRAcetam  500 mg Oral BID   sevelamer carbonate  3,200 mg Oral TID WC   warfarin  7.5 mg Oral ONCE-1600   Warfarin - Pharmacist Dosing Inpatient   Does not apply q1600    anticoagulant sodium citrate     heparin 1,200 Units/hr (04/03/23 1024)    acetaminophen, alteplase, anticoagulant sodium citrate, docusate sodium, feeding supplement (NEPRO CARB STEADY), hydrALAZINE, lidocaine (PF), lidocaine-prilocaine, LORazepam, OLANZapine, ondansetron (ZOFRAN) IV, pentafluoroprop-tetrafluoroeth, polyethylene glycol

## 2023-04-03 NOTE — Plan of Care (Signed)
  Problem: Ischemic Stroke/TIA Tissue Perfusion: Goal: Complications of ischemic stroke/TIA will be minimized Outcome: Progressing   Problem: Nutrition: Goal: Risk of aspiration will decrease Outcome: Progressing   Problem: Clinical Measurements: Goal: Will remain free from infection Outcome: Progressing Goal: Respiratory complications will improve Outcome: Progressing Goal: Cardiovascular complication will be avoided Outcome: Progressing   Problem: Elimination: Goal: Will not experience complications related to urinary retention Outcome: Progressing   Problem: Safety: Goal: Ability to remain free from injury will improve Outcome: Progressing

## 2023-04-03 NOTE — Progress Notes (Signed)
PHARMACY - ANTICOAGULATION CONSULT NOTE  Pharmacy Consult for heparin bridge + warfarin Indication:  APLA syndrome  Allergies  Allergen Reactions   Zyrtec [Cetirizine] Swelling    FACIAL SWELLING, NOT CATEGORIZED   Cetirizine & Related Swelling    Patient Measurements: Height: 6\' 1"  (185.4 cm) Weight: 104.2 kg (229 lb 11.5 oz) IBW/kg (Calculated) : 79.9 Heparin Dosing Weight: 103 kg   Vital Signs: Temp: 97.9 F (36.6 C) (10/16 1942) Temp Source: Oral (10/16 1942) BP: 130/96 (10/16 1942) Pulse Rate: 78 (10/16 1942)  Labs: Recent Labs    04/01/23 0406 04/02/23 0500 04/02/23 0800 04/03/23 0601 04/03/23 0738 04/03/23 1929  HGB 13.3  --  11.1*  --  12.3*  --   HCT 39.7  --  34.2*  --  38.0*  --   PLT 137*  --  114*  --  108*  --   LABPROT 14.3  --  15.1 15.3* 15.4*  --   INR 1.1  --  1.2 1.2 1.2  --   HEPARINUNFRC 0.21*   < >  --  0.16* 0.15* 0.18*  CREATININE 21.49*  --  23.78* 17.63*  --   --    < > = values in this interval not displayed.   Estimated Creatinine Clearance: 6.8 mL/min (A) (by C-G formula based on SCr of 17.63 mg/dL (H)).  Medical History: Past Medical History:  Diagnosis Date   ESRD on hemodialysis (HCC)    Hypertension    Lupus    Seizures (HCC)    Seizures (HCC)    Stroke (HCC)    23 or 42 years of age.     Wears glasses    Assessment: 42 yo M presented with seizures with HD. Patient has history antiphospholipid syndrome, leading to hypercoagulable state and causing hx of strokes. INR on admission 1.1 - most likely not taking Warfarin at home. Warfarin held and sq heparin initiated in interim. MRI showing multiple acute and subacute infarcts. Per MD, discussed with neurology and would like to start Warfarin and use heparin bridge until therapeutic.  Heparin level subtherapeutic at 0.18 on 1300 units/hr. Per RN, no pauses in infusion or line issues. No bleeding reported.   Goal of Therapy:  Heparin level 0.3-0.5 units/ml INR 2.5-3.5 per  neurology  Monitor platelets by anticoagulation protocol: Yes   Plan:  Increase heparin infusion to 1500 units/hr  Check heparin level in 8 hours and daily while on heparin  Continue to monitor H&H and platelets   Thank you for allowing pharmacy to be a part of this patient's care.  Cedric Fishman, PharmD, BCPS, BCCCP Clinical Pharmacist

## 2023-04-03 NOTE — Progress Notes (Signed)
EEG complete - results pending 

## 2023-04-03 NOTE — Plan of Care (Signed)
Problem: Education: Goal: Knowledge of disease or condition will improve Outcome: Not Progressing Goal: Knowledge of secondary prevention will improve (MUST DOCUMENT ALL) Outcome: Not Progressing Goal: Knowledge of patient specific risk factors will improve [name] [name] N/A or DELETE if not current risk factor) Outcome: Not Progressing   Problem: Ischemic Stroke/TIA Tissue Perfusion: Goal: Complications of ischemic stroke/TIA will be minimized Outcome: Not Progressing   Problem: Coping: Goal: Will verbalize positive feelings about self Outcome: Not Progressing Goal: Will identify appropriate support needs Outcome: Not Progressing   Problem: Health Behavior/Discharge Planning: Goal: Ability to manage health-related needs will improve Outcome: Not Progressing Goal: Goals will be collaboratively established with patient/family Outcome: Not Progressing   Problem: Self-Care: Goal: Ability to participate in self-care as condition permits will improve Outcome: Not Progressing Goal: Verbalization of feelings and concerns over difficulty with self-care will improve Outcome: Not Progressing Goal: Ability to communicate needs accurately will improve Outcome: Not Progressing   Problem: Nutrition: Goal: Risk of aspiration will decrease Outcome: Not Progressing Goal: Dietary intake will improve Outcome: Not Progressing   Problem: Education: Goal: Knowledge of General Education information will improve Description: Including pain rating scale, medication(s)/side effects and non-pharmacologic comfort measures Outcome: Not Progressing   Problem: Health Behavior/Discharge Planning: Goal: Ability to manage health-related needs will improve Outcome: Not Progressing   Problem: Clinical Measurements: Goal: Ability to maintain clinical measurements within normal limits will improve Outcome: Not Progressing Goal: Will remain free from infection Outcome: Not Progressing Goal: Diagnostic test  results will improve Outcome: Not Progressing Goal: Respiratory complications will improve Outcome: Not Progressing Goal: Cardiovascular complication will be avoided Outcome: Not Progressing   Problem: Activity: Goal: Risk for activity intolerance will decrease Outcome: Not Progressing   Problem: Nutrition: Goal: Adequate nutrition will be maintained Outcome: Not Progressing   Problem: Coping: Goal: Level of anxiety will decrease Outcome: Not Progressing   Problem: Elimination: Goal: Will not experience complications related to bowel motility Outcome: Not Progressing Goal: Will not experience complications related to urinary retention Outcome: Not Progressing   Problem: Pain Managment: Goal: General experience of comfort will improve Outcome: Not Progressing   Problem: Safety: Goal: Ability to remain free from injury will improve Outcome: Not Progressing   Problem: Skin Integrity: Goal: Risk for impaired skin integrity will decrease Outcome: Not Progressing

## 2023-04-03 NOTE — Progress Notes (Signed)
PHARMACY - ANTICOAGULATION CONSULT NOTE  Pharmacy Consult for heparin bridge + warfarin Indication:  APLA syndrome  Allergies  Allergen Reactions   Zyrtec [Cetirizine] Swelling    FACIAL SWELLING, NOT CATEGORIZED   Cetirizine & Related Swelling    Patient Measurements: Height: 6\' 1"  (185.4 cm) Weight: 104.2 kg (229 lb 11.5 oz) IBW/kg (Calculated) : 79.9 Heparin Dosing Weight: 103 kg   Vital Signs: Temp: 97.8 F (36.6 C) (10/16 0730) Temp Source: Oral (10/16 0730) BP: 126/90 (10/16 0818) Pulse Rate: 76 (10/16 0818)  Labs: Recent Labs    04/01/23 0406 04/02/23 0500 04/02/23 0800 04/03/23 0601 04/03/23 0738  HGB 13.3  --  11.1*  --  12.3*  HCT 39.7  --  34.2*  --  38.0*  PLT 137*  --  114*  --  108*  LABPROT 14.3  --  15.1 15.3* 15.4*  INR 1.1  --  1.2 1.2 1.2  HEPARINUNFRC 0.21* >1.10*  --  0.16* 0.15*  CREATININE 21.49*  --  23.78* 17.63*  --    Estimated Creatinine Clearance: 6.8 mL/min (A) (by C-G formula based on SCr of 17.63 mg/dL (H)).  Medical History: Past Medical History:  Diagnosis Date   ESRD on hemodialysis (HCC)    Hypertension    Lupus    Seizures (HCC)    Seizures (HCC)    Stroke (HCC)    19 or 42 years of age.     Wears glasses    Assessment: 42 yo M presented with seizures with HD. Patient has history antiphospholipid syndrome, leading to hypercoagulable state and causing hx of strokes. INR on admission 1.1 - most likely not taking Warfarin at home. Warfarin held and sq heparin initiated in interim. MRI showing multiple acute and subacute infarcts. Per MD, discussed with neurology and would like to start Warfarin and use heparin bridge until therapeutic.  INR 1.2 following x2 doses of warfarin 5mg . Heparin level above goal, confirmed with RN, drawn from cath while in HD, heparin infusion running in separate PIV. No other issues with heparin infusion or signs of bleeding reported. Patient refusing multiple labs.   Goal of Therapy:  Heparin  level 0.3-0.5 units/ml INR 2.5-3.5 per neurology  Monitor platelets by anticoagulation protocol: Yes   Plan:   Increase heparin infusion to 1300 units/hr  Check heparin level in 8 hours and daily while on heparin  Give warfarin 7.5 mg PO x1 dose Check INR daily while on warfarin  Continue to monitor H&H and platelets   Thank you for allowing pharmacy to be a part of this patient's care.  Thelma Barge, PharmD Clinical Pharmacist

## 2023-04-04 DIAGNOSIS — R569 Unspecified convulsions: Secondary | ICD-10-CM | POA: Diagnosis not present

## 2023-04-04 DIAGNOSIS — I6340 Cerebral infarction due to embolism of unspecified cerebral artery: Secondary | ICD-10-CM

## 2023-04-04 DIAGNOSIS — G9340 Encephalopathy, unspecified: Secondary | ICD-10-CM | POA: Diagnosis not present

## 2023-04-04 LAB — GLUCOSE, CAPILLARY
Glucose-Capillary: 100 mg/dL — ABNORMAL HIGH (ref 70–99)
Glucose-Capillary: 88 mg/dL (ref 70–99)
Glucose-Capillary: 92 mg/dL (ref 70–99)
Glucose-Capillary: 96 mg/dL (ref 70–99)

## 2023-04-04 LAB — RENAL FUNCTION PANEL
Albumin: 3.4 g/dL — ABNORMAL LOW (ref 3.5–5.0)
Anion gap: 25 — ABNORMAL HIGH (ref 5–15)
BUN: 107 mg/dL — ABNORMAL HIGH (ref 6–20)
CO2: 17 mmol/L — ABNORMAL LOW (ref 22–32)
Calcium: 8.6 mg/dL — ABNORMAL LOW (ref 8.9–10.3)
Chloride: 94 mmol/L — ABNORMAL LOW (ref 98–111)
Creatinine, Ser: 21.42 mg/dL — ABNORMAL HIGH (ref 0.61–1.24)
GFR, Estimated: 2 mL/min — ABNORMAL LOW (ref >60)
Glucose, Bld: 101 mg/dL — ABNORMAL HIGH (ref 70–99)
Phosphorus: 11.9 mg/dL — ABNORMAL HIGH (ref 2.5–4.6)
Potassium: 6.7 mmol/L (ref 3.5–5.1)
Sodium: 136 mmol/L (ref 135–145)

## 2023-04-04 LAB — CBC WITH DIFFERENTIAL/PLATELET
Abs Immature Granulocytes: 0.1 10*3/uL — ABNORMAL HIGH (ref 0.00–0.07)
Basophils Absolute: 0 10*3/uL (ref 0.0–0.1)
Basophils Relative: 0 %
Eosinophils Absolute: 0.3 10*3/uL (ref 0.0–0.5)
Eosinophils Relative: 3 %
HCT: 33.4 % — ABNORMAL LOW (ref 39.0–52.0)
Hemoglobin: 11.5 g/dL — ABNORMAL LOW (ref 13.0–17.0)
Immature Granulocytes: 1 %
Lymphocytes Relative: 14 %
Lymphs Abs: 1.3 10*3/uL (ref 0.7–4.0)
MCH: 30.4 pg (ref 26.0–34.0)
MCHC: 34.4 g/dL (ref 30.0–36.0)
MCV: 88.4 fL (ref 80.0–100.0)
Monocytes Absolute: 0.9 10*3/uL (ref 0.1–1.0)
Monocytes Relative: 10 %
Neutro Abs: 6.7 10*3/uL (ref 1.7–7.7)
Neutrophils Relative %: 72 %
Platelets: 109 10*3/uL — ABNORMAL LOW (ref 150–400)
RBC: 3.78 MIL/uL — ABNORMAL LOW (ref 4.22–5.81)
RDW: 14 % (ref 11.5–15.5)
WBC: 9.3 10*3/uL (ref 4.0–10.5)
nRBC: 0 % (ref 0.0–0.2)

## 2023-04-04 LAB — PROTIME-INR
INR: 1.3 — ABNORMAL HIGH (ref 0.8–1.2)
Prothrombin Time: 16.3 s — ABNORMAL HIGH (ref 11.4–15.2)

## 2023-04-04 LAB — HEPARIN LEVEL (UNFRACTIONATED): Heparin Unfractionated: 0.28 [IU]/mL — ABNORMAL LOW (ref 0.30–0.70)

## 2023-04-04 MED ORDER — WARFARIN SODIUM 5 MG PO TABS
10.0000 mg | ORAL_TABLET | Freq: Once | ORAL | Status: AC
  Administered 2023-04-04: 10 mg via ORAL
  Filled 2023-04-04: qty 2

## 2023-04-04 NOTE — Progress Notes (Addendum)
STROKE TEAM PROGRESS NOTE   SUBJECTIVE (INTERVAL HISTORY) NT is with him in HD unit for HD today. He is initially taking a nap but easily arousable. Orientated to place, year but not to month and told me he is 42 instead of 41. Neuro exam unchanged. CT and EEG yesterday no acute finding.    OBJECTIVE Temp:  [97.9 F (36.6 C)-98.9 F (37.2 C)] 98.4 F (36.9 C) (10/17 1500) Pulse Rate:  [67-79] 67 (10/17 1530) Cardiac Rhythm: Normal sinus rhythm (10/17 1500) Resp:  [10-19] 10 (10/17 1530) BP: (122-150)/(86-100) 128/99 (10/17 1530) SpO2:  [85 %-100 %] 98 % (10/17 1530) Weight:  [106.9 kg] 106.9 kg (10/17 1500)  Recent Labs  Lab 04/03/23 1947 04/04/23 0022 04/04/23 0412 04/04/23 0821 04/04/23 1216  GLUCAP 114* 100* 88 92 96   Recent Labs  Lab 03/30/23 0437 04/01/23 0406 04/02/23 0800 04/03/23 0601 04/04/23 1134  NA 136 136 136 133* 136  K 5.4* 4.8 5.5* 5.1 6.7*  CL 94* 89* 88* 88* 94*  CO2 21* 22 21* 23 17*  GLUCOSE 109* 102* 93 86 101*  BUN 69* 107* 120* 81* 107*  CREATININE 15.40* 21.49* 23.78* 17.63* 21.42*  CALCIUM 10.6* 10.7* 9.7 10.0 8.6*  MG 2.7*  --  3.0* 2.5*  --   PHOS 9.9* 11.7* >30.0* 10.9* 11.9*   Recent Labs  Lab 03/29/23 1327 04/01/23 0406 04/02/23 0800 04/04/23 1134  ALBUMIN 3.7 3.8 3.5 3.4*   Recent Labs  Lab 03/30/23 0437 04/01/23 0406 04/02/23 0800 04/03/23 0738 04/04/23 1134  WBC 16.1* 13.1* 8.9 6.8 9.3  NEUTROABS  --   --   --   --  6.7  HGB 12.2* 13.3 11.1* 12.3* 11.5*  HCT 38.2* 39.7 34.2* 38.0* 33.4*  MCV 92.9 92.5 92.2 92.5 88.4  PLT 130* 137* 114* 108* 109*   No results for input(s): "CKTOTAL", "CKMB", "CKMBINDEX", "TROPONINI" in the last 168 hours. Recent Labs    04/02/23 0800 04/03/23 0601 04/03/23 0738 04/04/23 0529  LABPROT 15.1 15.3* 15.4* 16.3*  INR 1.2 1.2 1.2 1.3*   No results for input(s): "COLORURINE", "LABSPEC", "PHURINE", "GLUCOSEU", "HGBUR", "BILIRUBINUR", "KETONESUR", "PROTEINUR", "UROBILINOGEN",  "NITRITE", "LEUKOCYTESUR" in the last 72 hours.  Invalid input(s): "APPERANCEUR"     Component Value Date/Time   CHOL 206 (H) 01/27/2023 0351   CHOL 194 09/27/2020 1140   TRIG 371 (H) 01/27/2023 0351   HDL 27 (L) 01/27/2023 0351   HDL 56 09/27/2020 1140   CHOLHDL 7.6 01/27/2023 0351   VLDL 74 (H) 01/27/2023 0351   LDLCALC 105 (H) 01/27/2023 0351   LDLCALC 114 (H) 09/27/2020 1140   Lab Results  Component Value Date   HGBA1C 5.2 01/19/2023      Component Value Date/Time   LABOPIA NONE DETECTED 02/15/2009 1435   COCAINSCRNUR NONE DETECTED 02/15/2009 1435   LABBENZ NONE DETECTED 02/15/2009 1435   AMPHETMU NONE DETECTED 02/15/2009 1435   THCU NONE DETECTED 02/15/2009 1435   LABBARB  02/15/2009 1435    NONE DETECTED        DRUG SCREEN FOR MEDICAL PURPOSES ONLY.  IF CONFIRMATION IS NEEDED FOR ANY PURPOSE, NOTIFY LAB WITHIN 5 DAYS.        LOWEST DETECTABLE LIMITS FOR URINE DRUG SCREEN Drug Class       Cutoff (ng/mL) Amphetamine      1000 Barbiturate      200 Benzodiazepine   200 Tricyclics       300 Opiates  300 Cocaine          300 THC              50    No results for input(s): "ETH" in the last 168 hours.   I have personally reviewed the radiological images below and agree with the radiology interpretations.  EEG adult  Result Date: 04/04/2023 Charlsie Quest, MD     04/04/2023  8:52 AM Patient Name: SALOMON GANSER MRN: [id number] Epilepsy Attending: Charlsie Quest Referring Physician/Provider: Elease Etienne, MD Date: 04/04/2023 Duration: 21.20 mins Patient history: 42 yo M presented with seizures with HD. Patient has history antiphospholipid syndrome, leading to hypercoagulable state and causing hx of strokes. EEG to evaluate for seizure Level of alertness: Awake AEDs during EEG study: LEV Technical aspects: This EEG study was done with scalp electrodes positioned according to the 10-20 International system of electrode placement. Electrical activity was  reviewed with band pass filter of 1-70Hz , sensitivity of 7 uV/mm, display speed of 61mm/sec with a 60Hz  notched filter applied as appropriate. EEG data were recorded continuously and digitally stored.  Video monitoring was available and reviewed as appropriate. Description: The posterior dominant rhythm consists of 8 Hz activity of moderate voltage (25-35 uV) seen predominantly in posterior head regions, symmetric and reactive to eye opening and eye closing. EEG showed continuous generalized 5 to 6 Hz theta slowing admixed with intermittent generalized high amplitude 2-3Hz  delta slowing. Hyperventilation and photic stimulation were not performed.   ABNORMALITY - Intermittent rhythmic delta slow, generalized - Continuous slow, generalized IMPRESSION: This study is suggestive of moderate diffuse encephalopathy. No seizures or epileptiform discharges were seen throughout the recording. Priyanka Annabelle Harman   CT HEAD WO CONTRAST ( )  Result Date: 04/03/2023 CLINICAL DATA:  Hypertension, altered mental status EXAM: CT HEAD WITHOUT CONTRAST TECHNIQUE: Contiguous axial images were obtained from the base of the skull through the vertex without intravenous contrast. RADIATION DOSE REDUCTION: This exam was performed according to the departmental dose-optimization program which includes automated exposure control, adjustment of the mA and/or kV according to patient size and/or use of iterative reconstruction technique. COMPARISON:  03/27/2023 FINDINGS: Brain: No evidence of acute infarction, hemorrhage, mass, mass effect, or midline shift. No hydrocephalus or extra-axial fluid collection. Redemonstrated chronic left frontoparietal, left parietooccipital, and right parietooccipital encephalomalacia. Chronic infarcts in the left cerebellum. Periventricular white matter changes, likely the sequela of chronic small vessel ischemic disease. Vascular: No hyperdense vessel. Skull: Negative for fracture or focal lesion.  Sinuses/Orbits: No acute finding. Other: The mastoid air cells are well aerated. IMPRESSION: No acute intracranial process. Electronically Signed   By: Wiliam Ke M.D.   On: 04/03/2023 20:42   MR BRAIN WO CONTRAST  Result Date: 03/30/2023 CLINICAL DATA:  History of seizures.  Altered mental status. EXAM: MRI HEAD WITHOUT CONTRAST TECHNIQUE: Multiplanar, multiecho pulse sequences of the brain and surrounding structures were obtained without intravenous contrast. COMPARISON:  MRI brain 01/26/2023.  Head CT 03/27/2023. FINDINGS: Brain: Scattered foci of acute and subacute infarction throughout both cerebral hemispheres, including the bilateral ACA, MCA and PCA territories. No acute hemorrhage or significant mass effect. Interval evolution of the left occipital and left cerebellar infarcts. Unchanged encephalomalacia along the left frontoparietal junction and right frontal operculum. Stable background of severe chronic small-vessel disease. No hydrocephalus or extra-axial collection. Vascular: Normal flow voids. Skull and upper cervical spine: Unchanged diffusely low marrow signal of the upper cervical spine. Sinuses/Orbits: No acute findings. Other: None. IMPRESSION: 1. Scattered  foci of acute and subacute infarction throughout both cerebral hemispheres, including the bilateral ACA, MCA and PCA territories. No acute hemorrhage or significant mass effect. 2. Interval evolution of the left occipital and left cerebellar infarcts. 3. Unchanged diffusely low marrow signal of the upper cervical spine, which can be seen in the setting of anemia and/or renal osteodystrophy. Electronically Signed   By: Orvan Falconer M.D.   On: 03/30/2023 20:56   DG CHEST PORT 1 VIEW  Result Date: 03/28/2023 CLINICAL DATA:  Altered mental status EXAM: PORTABLE CHEST 1 VIEW COMPARISON:  03/27/2023 FINDINGS: Right-sided central venous catheter tip at the right atrium. Cardiomegaly with vascular congestion. Partially visualized left  upper extremity metallic stent, present on multiple prior exams including chest radiographs performed prior to an MRI that was done in February of 2024. Negative for unexpected metallic density foreign body IMPRESSION: Cardiomegaly with vascular congestion. Electronically Signed   By: Jasmine Pang M.D.   On: 03/28/2023 20:00   DG Abd 1 View  Result Date: 03/28/2023 CLINICAL DATA:  Altered mental status EXAM: ABDOMEN - 1 VIEW COMPARISON:  11/08/2022 FINDINGS: Nonobstructed gas pattern. Presumed support wires over the upper and mid abdomen. No unexpected metallic density foreign body is seen IMPRESSION: Nonobstructed gas pattern. Negative for unexpected metallic density foreign body Electronically Signed   By: Jasmine Pang M.D.   On: 03/28/2023 19:58   EEG adult  Result Date: 03/28/2023 Charlsie Quest, MD     03/28/2023  7:08 AM Patient Name: KABLE HAYWOOD MRN: [id number] Epilepsy Attending: Charlsie Quest Referring Physician/Provider: Gordy Councilman, MD Date: 03/28/2023 Duration: 26.53 mins Patient history:  42 y.o. male with PMH of ESRD on HD MWF, antiphospholipid syndrome prescribed warfarin, multiple infarcts (most recent left PCA 01/2023), seizure, HTN with recent admission for respiratory failure requiring intubation in the setting of seizure and aspiration pneumonitis who presented from HD with abnormal behavior including not wearing a shirt to HD center. In the ED, he became hypoxic and had witnessed seizure. Was given IV ativan and keppra load. EEG to evaluate for seizure Level of alertness: Awake AEDs during EEG study: LEV Technical aspects: This EEG study was done with scalp electrodes positioned according to the 10-20 International system of electrode placement. Electrical activity was reviewed with band pass filter of 1-70Hz , sensitivity of 7 uV/mm, display speed of 56mm/sec with a 60Hz  notched filter applied as appropriate. EEG data were recorded continuously and digitally stored.   Video monitoring was available and reviewed as appropriate. Description: No posterior dominant rhythm was seen. EEG showed continuous generalized polymorphic sharply contoured high amplitude 3 to 5 Hz theta-delta slowing. Hyperventilation and photic stimulation were not performed.   ABNORMALITY - Continuous slow, generalized IMPRESSION: This study is suggestive of moderate to severe diffuse encephalopathy. No seizures or epileptiform discharges were seen throughout the recording. Priyanka Annabelle Harman   CT HEAD WO CONTRAST ( )  Result Date: 03/27/2023 CLINICAL DATA:  Mental status change EXAM: CT HEAD WITHOUT CONTRAST TECHNIQUE: Contiguous axial images were obtained from the base of the skull through the vertex without intravenous contrast. RADIATION DOSE REDUCTION: This exam was performed according to the departmental dose-optimization program which includes automated exposure control, adjustment of the mA and/or kV according to patient size and/or use of iterative reconstruction technique. COMPARISON:  CT brain 01/02/2023, 12/29/2022, 11/08/2022, MRI 08/10/2022 FINDINGS: Brain: Negative for hemorrhage, mass effect or midline shift. Atrophy and advanced chronic small vessel ischemic changes of the white matter. Chronic left parietal infarct  with encephalomalacia. Interval left posterior parieto-occipital infarct, series 2, image 24 which appears more subacute to chronic but is new from CT in July. Multiple small chronic infarcts in the left cerebellum, though with suspected interval left cerebellar hypo density on series 2, image 12. Vascular: No hyperdense vessels.  No unexpected calcification Skull: Normal. Negative for fracture or focal lesion. Sinuses/Orbits: Mild mucosal sinus changes Other: None IMPRESSION: 1. Negative for acute intracranial hemorrhage. 2. Atrophy and advanced chronic small vessel ischemic changes of the white matter. Chronic left parietal infarct. Interval left posterior parieto-occipital  infarct which appears more subacute to chronic in nature but is new from CT in July. Multiple small chronic infarcts in the left cerebellum, though with suspected interval left cerebellar hypo density, raising concern for possible acute or subacute infarct. Consider further assessment with MRI. Electronically Signed   By: Jasmine Pang M.D.   On: 03/27/2023 15:35   DG Chest Portable 1 View  Result Date: 03/27/2023 CLINICAL DATA:  Hypoxia EXAM: PORTABLE CHEST 1 VIEW COMPARISON:  01/02/2023 FINDINGS: Right-sided central venous catheter tip at the right atrium. Cardiomegaly with vascular congestion and diffuse hazy pulmonary density consistent with edema. No pleural effusion or pneumothorax. Partially visualized stent in the left upper extremity. IMPRESSION: Cardiomegaly with vascular congestion and diffuse hazy pulmonary density suspicious for mild pulmonary edema. Electronically Signed   By: Jasmine Pang M.D.   On: 03/27/2023 15:25     PHYSICAL EXAM  Temp:  [97.9 F (36.6 C)-98.9 F (37.2 C)] 98.4 F (36.9 C) (10/17 1500) Pulse Rate:  [67-79] 67 (10/17 1530) Resp:  [10-19] 10 (10/17 1530) BP: (122-150)/(86-100) 128/99 (10/17 1530) SpO2:  [85 %-100 %] 98 % (10/17 1530) Weight:  [106.9 kg] 106.9 kg (10/17 1500)  General - Well nourished, well developed, in no apparent distress.  Ophthalmologic - fundi not visualized due to noncooperation.  Cardiovascular - Regular rhythm and rate.  Neuro - initially taking a nap but easily arousable, then awake, alert, eyes open, orientated to place, year but not to month. Told me age 71 instead of 64. No aphasia, but paucity of speech, answer questions with words and short sentences, cut following all simple commands and able to name and repeat, but mild psychomotor slowing. No gaze palsy, tracking bilaterally, visual field full although decreased visual acuity on the left lower quadrant. Mild right nasolabial fold flattening. Tongue midline. RUE 4/5 no  drift, LUE deltoid 0/5 with previous surgery per pt, not able to lift arm up but bicep 4/5, tricep 4/5 and finger grip 4/5. BLE 4/5. Sensation decreased on the left, b/l FTN intact grossly, gait not tested.     ASSESSMENT/PLAN Mr. GIOVANNI BIBY is a 42 y.o. male with history of ESRD on hemodialysis, SLE with antiphospholipid syndrome syndrome on Coumadin, hypertension, seizure on Keppra, history of stroke admitted for seizure. No tPA given due to outside window.    Seizure with encephalopathy History of seizure Patient has history of complex partial seizure, known by neurology service Being seen several times in the past for seizure in clinic and hospital Home medication Keppra 500 twice daily Questionable compliance Admitted this time for seizure and aspiration pneumonitis.  Received Ativan and Keppra load EEG 10/10 showed moderate to severe diffuse encephalopathy, no seizures or epileptiform discharges. EEG 10/17 moderate diffuse encephalopathy. No seizures or epileptiform discharges were seen throughout the recording.  Continue Keppra  Stroke:  bilateral scattered punctate embolic shower, likely secondary to antiphospholipid syndrome on Coumadin with substituted INR and  noncompliance CT no acute abnormality, old left parietal, PCA and cerebellar infarcts MRI scattered bilateral punctate infarcts.  No acute hemorrhage or mass effect. CT 10/16 No acute intracranial process.  Heparin IV for VTE prophylaxis warfarin daily but not compliant prior to admission, now on warfarin daily with heparin IV bridge Patient counseled to be compliant with his antithrombotic medications Ongoing aggressive stroke risk factor management Therapy recommendations: Pending Disposition: Pending  History of stroke 01/2023 admitted for left PCA infarct.  MRI unremarkable.  Carotid Doppler unremarkable.  EF 55 to 60%.  LDL 105, A1c 5.2.  Discharged on Eliquis and Lipitor 40.  SLE with antiphospholipid  syndrome Was on Eliquis before but noncompliant Changed to Coumadin with admission INR 1.1, seems also noncompliant However, given patient's ESRD on dialysis and antiphospholipid syndrome, Coumadin is the best option and he has not much choices. Now Coumadin with heparin IV bridge, INR goal 2.5-3.5  Hypertension Stable Long term BP goal normotensive  Hyperlipidemia Home meds: Lipitor 40, doubt compliance LDL 105 in 01/2023, goal < 70 Now on Lipitor 40 Continue statin at discharge  Other Stroke Risk Factors Medication noncompliance  Other Active Problems Leukocytosis WBC 16.1->9.3 ESRD on HD  Hospital day # 8  Neurology will sign off. Please call with questions. Pt will follow up with Dr. Lucia Gaskins at [facility] in about 4 weeks. Thanks for the consult.   Marvel Plan, MD PhD Stroke Neurology 04/04/2023 3:42 PM    To contact Stroke Continuity provider, please refer to [email or web address]. After hours, contact General Neurology

## 2023-04-04 NOTE — Progress Notes (Signed)
[facility] Progress Note  Subjective: seen in room.  Has sitter still - same as yesterday and she says he's been doing ok. Sounds like Tues overnight was an issue but not since.  Tolerated HD fine yesterday  Vitals:   04/03/23 2335 04/04/23 0409 04/04/23 0816 04/04/23 0824  BP: (!) 140/95 124/88 (!) 150/93 (!) 142/86  Pulse: 79 68 78 74  Resp: 18  17   Temp: 98.6 F (37 C) 98.7 F (37.1 C) 98.2 F (36.8 C)   TempSrc: Oral Axillary Oral   SpO2: 96% 94% 95% 97%  Weight:      Height:        Exam: Gen awake eating hearty breakfast No jvd or bruits Chest clear bilat to bases RRR no MRG Abd soft ntnd no mass or ascites +bs Ext no  LE edema Neuro AOx3, conversant and calm    RIJ TDC intact      Renal-related home meds: - norvasc 10 - coreg 12.5 bid - sensipar 60mg  hs - eliquis 2.5 bid - midodrine 5mg  tid - renavite - renvela 4 ac tid      OP HD: [facility] MWF  4h   400/800   101.5kg   2/2 bath   RIJ TDC    Heparin none - last OP HD today 10/9, post wt 110.5kg (9kg over) - mircera 50 mcg IV q 2wks, last 10/2, due 10/16  - venofer 100mg  three times per week thru today - rocaltrol 2.0 mcg      CXR 10/09 - IMPRESSION: Cardiomegaly with vascular congestion and diffuse hazy pulmonary density suspicious for mild pulmonary edema.   Assessment/ Plan: Seizures - acute on chronic, takes Keppra at home but misses doses. Getting keppra here. MRI with scattered infarcts - neurology implicates subtherapeutic INR.  Setting up alarms/reminders to take meds. H/o antiphospholipid syndrome - hx SLE, has been tried on multiple anticoagulants in the past but struggles w/ medication adherence.  On heparin/coumadin bridge currently.  ESRD - on HD MWF. HD Tues as rollover from [place]. Next HD today (Thurs).  Will resume outpt schedule at some point - due to limited IVF supply related to supply chain limiting treatments done just to "get back on schedule"  What I think will happen is he'll  be here waiting on therapeutic INR through Sat so we can dialyze Sat then go back to MWF.  HTN/ BP - BP's high on admission, normal/low now.  Volume - CXR admit showed edema now improved and on RA.  UF with HD.  EDW 101.5kg, post wt yesterday 104.2kg - cont to work towards.  On renal diet/fluid restriction Anemia esrd - Hb 11- 13, no esa needs. Next esa due 10/16.  MBD ckd - CCa normalized, and phos is very high. Resumed binder and sensipar. Holding po vdra due to [employer]. Renal diet.    Has to dialyze without sitter - don't think that will be a problem.   Estill Bakes MD [facility] Pager [phone]   Recent Labs  Lab 04/01/23 0406 04/02/23 0800 04/03/23 0601 04/03/23 0738  HGB 13.3 11.1*  --  12.3*  ALBUMIN 3.8 3.5  --   --   CALCIUM 10.7* 9.7 10.0  --   PHOS 11.7* >30.0* 10.9*  --   CREATININE 21.49* 23.78* 17.63*  --   K 4.8 5.5* 5.1  --    No results for input(s): "IRON", "TIBC", "FERRITIN" in the last 168 hours. Inpatient medications:  atorvastatin  40 mg  Oral Daily   Chlorhexidine Gluconate Cloth  6 each Topical Q0600   cinacalcet  60 mg Oral Q supper   levETIRAcetam  500 mg Oral BID   sevelamer carbonate  3,200 mg Oral TID WC   warfarin  10 mg Oral ONCE-1600   Warfarin - Pharmacist Dosing Inpatient   Does not apply q1600    anticoagulant sodium citrate     heparin 1,600 Units/hr (04/04/23 0729)    acetaminophen, alteplase, anticoagulant sodium citrate, docusate sodium, feeding supplement (NEPRO CARB STEADY), hydrALAZINE, lidocaine (PF), lidocaine-prilocaine, LORazepam, OLANZapine, ondansetron (ZOFRAN) IV, pentafluoroprop-tetrafluoroeth, polyethylene glycol

## 2023-04-04 NOTE — Progress Notes (Signed)
1      PROGRESS NOTE   [name] [name]  QMV:[id number]    DOB: 10/15/1980    DOA: 03/27/2023  PCP: Claiborne Rigg, NP   I have briefly reviewed patients previous medical records in [facility].  Chief Complaint  Patient presents with   Altered Mental Status   Hypertension    Brief Hospital Course:  42 years old male with PMH significant for essential hypertension, ESRD on hemodialysis, SLE with APLA previously on Eliquis, CVA, chronic thrombocytopenia, anemia of chronic disease, history of seizures, history of medication noncompliance presented to the ED from dialysis for not acting himself during dialysis.  Patient was found to have active seizures in the ER with tongue biting and was hypoxic with SpO2 of 60% on room air which improved to 100% on nonrebreather and was treated with IV Ativan.  Patient remained postictal in the ER occasionally agitated.  He was loaded with Keppra.  Neurology was consulted.  CT head without acute bleed but possibly shows new stroke.  Patient had multiple ER visits for breakthrough seizures.  MRI showed acute infarct of the left PCA territory, no hemorrhage or mass effect.  Patient was admitted in the ICU. TRH pickup 03/29/2023     Assessment & Plan:  Principal Problem:   Seizure (HCC)   Seizure with postictal encephalopathy/history of seizures: History of complex partial seizures. Multiple ED visits for breakthrough seizures.  Questionable compliance-reportedly misses about 3-4 Dieulafoy of Keppra per week. Admitted for seizure and aspiration pneumonitis.  Received Ativan and Keppra load EEG showed moderate to diffuse encephalopathy, no seizures or epileptiform discharges. Continue Keppra 500 Mg twice daily. Seizure precautions.  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 Prior left PCA infarct in August 2024.  Carotid Dopplers at that time unremarkable.  Had been discharged on Eliquis and Lipitor. MRI showed multiple  acute and subacute infarcts.  Per neurology, likely secondary to antiphospholipid syndrome on Coumadin with subtherapeutic INR and noncompliance. Noncompliant to warfarin, now on warfarin with heparin IV bridge. Compliance has been counseled this admission. Continue Olanzapine prn for agitation PT and OT order placed Due to somnolence on 10/16, obtain CT head without acute abnormalities.  Acute hypoxic respiratory failure: Likely multifactorial. Resolved.   ESRD on MWF HD/hyperkalemia Nephrology follow-up appreciated. HD on 10/16 as a rollover from yesterday and again next HD on 10/17. Potassium 6.7, bicarb 17, creatinine 21.42, phosphorus 11.9.  Undergoing HD 10/17 and then 10/19.  Metabolic bone disease Significant hyperphosphatemia >30.  Nephrology resuming binder and Sensipar.  Phosphorus 11.9.   Essential hypertension: Controlled on as needed hydralazine.  Hyperlipidemia: Continue Lipitor, again without compliance.  Anemia in ESRD: Stable.  Thrombocytopenia Appears new compared to August 2024.  Fluctuating but relatively stable.  Acute encephalopathy: Noted on 10/16. CT head without acute findings.  EEG suggestive of moderate diffuse encephalopathy.  No seizures or epileptiform discharges. Alert and oriented.  No agitation.  Could consider discontinuing safety sitter and monitoring off of it.   Antiphospholipid Syndrome  SLE  Poor Med Adherence: Patient has tried multiple anticoagulants including warfarin, eliquis, xarelto, but struggles w/ med adherence.  Looks like he was most recently taking Warfarin, but INR on admission suggests pt was not taking regularly. Although warfarin is first line for antiphopsolipid syndrome, given pt has poor follow-up and poor med adherence, Eliquis may be a better option.  Discussed with Neurology , Patient resumed on coumadin.  Appears that he has been noncompliant with several  meds. As per neurology note, given patient's ESRD on  dialysis and antiphospholipid syndrome, Coumadin may be the best option and he may not have much choices. Currently on IV heparin bridge and Coumadin.  INR 1.3.  Goal 2.5-3.5 and may take several days to get there.  Body mass index is 31.09 kg/m.     DVT prophylaxis: SCDs Start: 03/27/23 1703     Code Status: Full Code:  Family Communication: None at bedside. Disposition:  Status is: Inpatient Remains inpatient appropriate because: Subtherapeutic INR, heparin bridge, mental status changes.     Consultants:   Nephrology Neurology  Procedures:     Antimicrobials:      Subjective:  Patient does not recollect seeing me yesterday.  Denies complaints.  States that he has not been out of bed because they have not allowed him to.  Per safety sitter at bedside, no acute issues reported.  Objective:   Vitals:   04/04/23 0816 04/04/23 0824 04/04/23 1220 04/04/23 1500  BP: (!) 150/93 (!) 142/86 (!) 129/99 (!) 144/100  Pulse: 78 74 79 70  Resp: 17  19 11   Temp: 98.2 F (36.8 C)  98.8 F (37.1 C) 98.4 F (36.9 C)  TempSrc: Oral  Oral   SpO2: 95% 97% (!) 85% 96%  Weight:    106.9 kg  Height:        General exam: Young male, moderately built and nourished lying comfortably propped up in bed without distress.  Looks improved compared to yesterday. Respiratory system: Clear to auscultation. Respiratory effort normal. Cardiovascular system: S1 & S2 heard, RRR. No JVD, murmurs, rubs, gallops or clicks. No pedal edema.  Telemetry personally reviewed: Sinus rhythm. Gastrointestinal system: Abdomen is nondistended, soft and nontender. No organomegaly or masses felt. Normal bowel sounds heard. Central nervous system: Alert and oriented.  No focal neurological deficits. Extremities: Symmetric 5 x 5 power. Skin: No rashes, lesions or ulcers Psychiatry: Judgement and insight intact. Mood & affect pleasant and appropriate.     Data Reviewed:   I have personally reviewed following labs  and imaging studies   CBC: Recent Labs  Lab 04/02/23 0800 04/03/23 0738 04/04/23 1134  WBC 8.9 6.8 9.3  NEUTROABS  --   --  6.7  HGB 11.1* 12.3* 11.5*  HCT 34.2* 38.0* 33.4*  MCV 92.2 92.5 88.4  PLT 114* 108* 109*    Basic Metabolic Panel: Recent Labs  Lab 03/30/23 0437 04/01/23 0406 04/02/23 0800 04/03/23 0601 04/04/23 1134  NA 136 136 136 133* 136  K 5.4* 4.8 5.5* 5.1 6.7*  CL 94* 89* 88* 88* 94*  CO2 21* 22 21* 23 17*  GLUCOSE 109* 102* 93 86 101*  BUN 69* 107* 120* 81* 107*  CREATININE 15.40* 21.49* 23.78* 17.63* 21.42*  CALCIUM 10.6* 10.7* 9.7 10.0 8.6*  MG 2.7*  --  3.0* 2.5*  --   PHOS 9.9* 11.7* >30.0* 10.9* 11.9*    Liver Function Tests: Recent Labs  Lab 03/29/23 1327 04/01/23 0406 04/02/23 0800 04/04/23 1134  ALBUMIN 3.7 3.8 3.5 3.4*    CBG: Recent Labs  Lab 04/04/23 0412 04/04/23 0821 04/04/23 1216  GLUCAP 88 92 96    Microbiology Studies:  No results found for this or any previous visit (from the past 240 hour(s)).  Radiology Studies:  EEG adult  Result Date: 04/04/2023 Charlsie Quest, MD     04/04/2023  8:52 AM Patient Name: [name] [name] MRN: [id number] Epilepsy Attending: Charlsie Quest Referring Physician/Provider: Marcellus Scott  D, MD Date: 04/04/2023 Duration: 21.20 mins Patient history: 42 yo M presented with seizures with HD. Patient has history antiphospholipid syndrome, leading to hypercoagulable state and causing hx of strokes. EEG to evaluate for seizure Level of alertness: Awake AEDs during EEG study: LEV Technical aspects: This EEG study was done with scalp electrodes positioned according to the 10-20 International system of electrode placement. Electrical activity was reviewed with band pass filter of 1-70Hz , sensitivity of 7 uV/mm, display speed of 16mm/sec with a 60Hz  notched filter applied as appropriate. EEG data were recorded continuously and digitally stored.  Video monitoring was available and reviewed as  appropriate. Description: The posterior dominant rhythm consists of 8 Hz activity of moderate voltage (25-35 uV) seen predominantly in posterior head regions, symmetric and reactive to eye opening and eye closing. EEG showed continuous generalized 5 to 6 Hz theta slowing admixed with intermittent generalized high amplitude 2-3Hz  delta slowing. Hyperventilation and photic stimulation were not performed.   ABNORMALITY - Intermittent rhythmic delta slow, generalized - Continuous slow, generalized IMPRESSION: This study is suggestive of moderate diffuse encephalopathy. No seizures or epileptiform discharges were seen throughout the recording. Priyanka Annabelle Harman   CT HEAD WO CONTRAST ( )  Result Date: 04/03/2023 CLINICAL DATA:  Hypertension, altered mental status EXAM: CT HEAD WITHOUT CONTRAST TECHNIQUE: Contiguous axial images were obtained from the base of the skull through the vertex without intravenous contrast. RADIATION DOSE REDUCTION: This exam was performed according to the departmental dose-optimization program which includes automated exposure control, adjustment of the mA and/or kV according to patient size and/or use of iterative reconstruction technique. COMPARISON:  03/27/2023 FINDINGS: Brain: No evidence of acute infarction, hemorrhage, mass, mass effect, or midline shift. No hydrocephalus or extra-axial fluid collection. Redemonstrated chronic left frontoparietal, left parietooccipital, and right parietooccipital encephalomalacia. Chronic infarcts in the left cerebellum. Periventricular white matter changes, likely the sequela of chronic small vessel ischemic disease. Vascular: No hyperdense vessel. Skull: Negative for fracture or focal lesion. Sinuses/Orbits: No acute finding. Other: The mastoid air cells are well aerated. IMPRESSION: No acute intracranial process. Electronically Signed   By: Wiliam Ke M.D.   On: 04/03/2023 20:42    Scheduled Meds:    atorvastatin  40 mg Oral Daily    Chlorhexidine Gluconate Cloth  6 each Topical Q0600   cinacalcet  60 mg Oral Q supper   levETIRAcetam  500 mg Oral BID   sevelamer carbonate  3,200 mg Oral TID WC   warfarin  10 mg Oral ONCE-1600   Warfarin - Pharmacist Dosing Inpatient   Does not apply q1600    Continuous Infusions:    anticoagulant sodium citrate     heparin 1,600 Units/hr (04/04/23 1200)     LOS: 8 days     Marcellus Scott, MD,  FACP, [facility], [facility], [facility]   [facility] Advisor [place]      To contact the attending provider between 7A-7P or the covering provider during after hours 7P-7A, please log into the web site [email or web address] and access using universal [place] password for that web site. If you do not have the password, please call the hospital operator.  04/04/2023, 3:18 PM

## 2023-04-04 NOTE — Procedures (Signed)
Patient Name: [name] [name]  MRN: [id number]  Epilepsy Attending: Charlsie Quest  Referring Physician/Provider: Elease Etienne, MD  Date: 04/04/2023 Duration: 21.20 mins  Patient history: 42 yo M presented with seizures with HD. Patient has history antiphospholipid syndrome, leading to hypercoagulable state and causing hx of strokes. EEG to evaluate for seizure  Level of alertness: Awake  AEDs during EEG study: LEV  Technical aspects: This EEG study was done with scalp electrodes positioned according to the 10-20 International system of electrode placement. Electrical activity was reviewed with band pass filter of 1-70Hz , sensitivity of 7 uV/mm, display speed of 71mm/sec with a 60Hz  notched filter applied as appropriate. EEG data were recorded continuously and digitally stored.  Video monitoring was available and reviewed as appropriate.  Description: The posterior dominant rhythm consists of 8 Hz activity of moderate voltage (25-35 uV) seen predominantly in posterior head regions, symmetric and reactive to eye opening and eye closing. EEG showed continuous generalized 5 to 6 Hz theta slowing admixed with intermittent generalized high amplitude 2-3Hz  delta slowing. Hyperventilation and photic stimulation were not performed.     ABNORMALITY - Intermittent rhythmic delta slow, generalized - Continuous slow, generalized  IMPRESSION: This study is suggestive of moderate diffuse encephalopathy. No seizures or epileptiform discharges were seen throughout the recording.  [name] [name]

## 2023-04-04 NOTE — Progress Notes (Signed)
PHARMACY - ANTICOAGULATION CONSULT NOTE  Pharmacy Consult for heparin bridge + warfarin Indication:  APLA syndrome  Allergies  Allergen Reactions   Zyrtec [Cetirizine] Swelling    FACIAL SWELLING, NOT CATEGORIZED   Cetirizine & Related Swelling    Patient Measurements: Height: 6\' 1"  (185.4 cm) Weight: 104.2 kg (229 lb 11.5 oz) IBW/kg (Calculated) : 79.9 Heparin Dosing Weight: 103 kg   Vital Signs: Temp: 98.7 F (37.1 C) (10/17 0409) Temp Source: Axillary (10/17 0409) BP: 124/88 (10/17 0409) Pulse Rate: 68 (10/17 0409)  Labs: Recent Labs    04/02/23 0800 04/03/23 0601 04/03/23 0738 04/03/23 1929 04/04/23 0529  HGB 11.1*  --  12.3*  --   --   HCT 34.2*  --  38.0*  --   --   PLT 114*  --  108*  --   --   LABPROT 15.1 15.3* 15.4*  --  16.3*  INR 1.2 1.2 1.2  --  1.3*  HEPARINUNFRC  --  0.16* 0.15* 0.18* 0.28*  CREATININE 23.78* 17.63*  --   --   --    Estimated Creatinine Clearance: 6.8 mL/min (A) (by C-G formula based on SCr of 17.63 mg/dL (H)).  Medical History: Past Medical History:  Diagnosis Date   ESRD on hemodialysis (HCC)    Hypertension    Lupus    Seizures (HCC)    Seizures (HCC)    Stroke (HCC)    56 or 42 years of age.     Wears glasses    Assessment: 42 yo M presented with seizures with HD. Patient has history antiphospholipid syndrome, leading to hypercoagulable state and causing hx of strokes. INR on admission 1.1 - most likely not taking Warfarin at home. Warfarin held and sq heparin initiated in interim. MRI showing multiple acute and subacute infarcts. Per MD, discussed with neurology and would like to start Warfarin and use heparin bridge until therapeutic.  Heparin level just below goal at 0.28.  Goal of Therapy:  Heparin level 0.3-0.5 units/ml INR 2.5-3.5 per neurology  Monitor platelets by anticoagulation protocol: Yes   Plan:  Increase heparin infusion to 1600 units/h Daily heparin level and CBC  Fredonia Highland, PharmD, BCPS,  [facility] Clinical Pharmacist Please check AMION for all [facility] Pharmacy numbers 04/04/2023

## 2023-04-04 NOTE — Progress Notes (Signed)
PHARMACY - ANTICOAGULATION CONSULT NOTE  Pharmacy Consult for heparin bridge + warfarin Indication:  APLA syndrome  Allergies  Allergen Reactions   Zyrtec [Cetirizine] Swelling    FACIAL SWELLING, NOT CATEGORIZED   Cetirizine & Related Swelling    Patient Measurements: Height: 6\' 1"  (185.4 cm) Weight: 104.2 kg (229 lb 11.5 oz) IBW/kg (Calculated) : 79.9 Heparin Dosing Weight: 103 kg   Vital Signs: Temp: 98.7 F (37.1 C) (10/17 0409) Temp Source: Axillary (10/17 0409) BP: 124/88 (10/17 0409) Pulse Rate: 68 (10/17 0409)  Labs: Recent Labs    04/02/23 0800 04/03/23 0601 04/03/23 0738 04/03/23 1929 04/04/23 0529  HGB 11.1*  --  12.3*  --   --   HCT 34.2*  --  38.0*  --   --   PLT 114*  --  108*  --   --   LABPROT 15.1 15.3* 15.4*  --  16.3*  INR 1.2 1.2 1.2  --  1.3*  HEPARINUNFRC  --  0.16* 0.15* 0.18* 0.28*  CREATININE 23.78* 17.63*  --   --   --    Estimated Creatinine Clearance: 6.8 mL/min (A) (by C-G formula based on SCr of 17.63 mg/dL (H)).  Medical History: Past Medical History:  Diagnosis Date   ESRD on hemodialysis (HCC)    Hypertension    Lupus    Seizures (HCC)    Seizures (HCC)    Stroke (HCC)    79 or 42 years of age.     Wears glasses    Assessment: 42 yo M presented with seizures with HD. Patient has history antiphospholipid syndrome, leading to hypercoagulable state and causing hx of strokes. INR on admission 1.1 - most likely not taking Warfarin at home. Warfarin held and sq heparin initiated in interim. MRI showing multiple acute and subacute infarcts. Per MD, discussed with neurology and would like to start Warfarin and use heparin bridge until therapeutic.  INR 1.2 following x2 doses of warfarin 5mg  and x2 doses of warfarin 7.5mg .  No other issues with heparin infusion or signs of bleeding reported. Patient refusing multiple labs.   Goal of Therapy:  Heparin level 0.3-0.5 units/ml INR 2.5-3.5 per neurology  Monitor platelets by  anticoagulation protocol: Yes   Plan:   Continue heparin infusion at 1600 units/hr  Check heparin level daily while on heparin  Give warfarin 10 mg PO x1 dose Check INR daily while on warfarin  Continue to monitor H&H and platelets   Thank you for allowing pharmacy to be a part of this patient's care.  Thelma Barge, PharmD Clinical Pharmacist

## 2023-04-05 DIAGNOSIS — I6340 Cerebral infarction due to embolism of unspecified cerebral artery: Secondary | ICD-10-CM | POA: Diagnosis not present

## 2023-04-05 DIAGNOSIS — R569 Unspecified convulsions: Secondary | ICD-10-CM | POA: Diagnosis not present

## 2023-04-05 LAB — GLUCOSE, CAPILLARY
Glucose-Capillary: 107 mg/dL — ABNORMAL HIGH (ref 70–99)
Glucose-Capillary: 113 mg/dL — ABNORMAL HIGH (ref 70–99)
Glucose-Capillary: 132 mg/dL — ABNORMAL HIGH (ref 70–99)
Glucose-Capillary: 136 mg/dL — ABNORMAL HIGH (ref 70–99)
Glucose-Capillary: 87 mg/dL (ref 70–99)
Glucose-Capillary: 89 mg/dL (ref 70–99)
Glucose-Capillary: 92 mg/dL (ref 70–99)
Glucose-Capillary: 98 mg/dL (ref 70–99)

## 2023-04-05 LAB — PROTIME-INR
INR: 1.4 — ABNORMAL HIGH (ref 0.8–1.2)
Prothrombin Time: 17.8 s — ABNORMAL HIGH (ref 11.4–15.2)

## 2023-04-05 LAB — BASIC METABOLIC PANEL
Anion gap: 18 — ABNORMAL HIGH (ref 5–15)
BUN: 56 mg/dL — ABNORMAL HIGH (ref 6–20)
CO2: 22 mmol/L (ref 22–32)
Calcium: 9.7 mg/dL (ref 8.9–10.3)
Chloride: 92 mmol/L — ABNORMAL LOW (ref 98–111)
Creatinine, Ser: 14.24 mg/dL — ABNORMAL HIGH (ref 0.61–1.24)
GFR, Estimated: 4 mL/min — ABNORMAL LOW (ref >60)
Glucose, Bld: 83 mg/dL (ref 70–99)
Potassium: 5.1 mmol/L (ref 3.5–5.1)
Sodium: 132 mmol/L — ABNORMAL LOW (ref 135–145)

## 2023-04-05 LAB — CBC
HCT: 36 % — ABNORMAL LOW (ref 39.0–52.0)
Hemoglobin: 11.8 g/dL — ABNORMAL LOW (ref 13.0–17.0)
MCH: 29.6 pg (ref 26.0–34.0)
MCHC: 32.8 g/dL (ref 30.0–36.0)
MCV: 90.5 fL (ref 80.0–100.0)
Platelets: 100 10*3/uL — ABNORMAL LOW (ref 150–400)
RBC: 3.98 MIL/uL — ABNORMAL LOW (ref 4.22–5.81)
RDW: 13.9 % (ref 11.5–15.5)
WBC: 7.1 10*3/uL (ref 4.0–10.5)
nRBC: 0 % (ref 0.0–0.2)

## 2023-04-05 LAB — HEPARIN LEVEL (UNFRACTIONATED): Heparin Unfractionated: 0.31 [IU]/mL (ref 0.30–0.70)

## 2023-04-05 MED ORDER — WARFARIN SODIUM 5 MG PO TABS
10.0000 mg | ORAL_TABLET | Freq: Once | ORAL | Status: AC
  Administered 2023-04-05: 10 mg via ORAL
  Filled 2023-04-05: qty 2

## 2023-04-05 MED ORDER — CHLORHEXIDINE GLUCONATE CLOTH 2 % EX PADS: 6.0000 | MEDICATED_PAD | Freq: Every day | CUTANEOUS | Status: DC

## 2023-04-05 NOTE — Consult Note (Addendum)
Physical Medicine and Rehabilitation Consult Reason for Consult: Evaluate appropriateness for Inpatient Rehab Referring Physician: Dr. Waymon Amato    HPI: [name] [name] is a 42 y.o. male with PMHx of  has a past medical history of ESRD on hemodialysis (HCC), Hypertension, Lupus, Seizures (HCC), Seizures (HCC), Stroke (HCC), and Wears glasses. . They were admitted to [facility] on 03/27/2023 for altered mental status during dialysis.  In the ER, started having tonic-clonic seizure, improved with nonrebreather for hypoxia and 2 mg IV Ativan.  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  He was loaded with Keppra, neurology was consulted, and MRI showing multiple acute and subacute infarcts likely secondary to APLA with Coumadin noncompliance/subtherapeutic INR.  Chest x-ray showed cardiomegaly and vascular congestion, and hypoxia was concerning for aspiration pneumonitis, which has resolved.  Hospitalization was further complicated by ESRD on HD, somnolence 10-16 without acute findings and self improved, hyperphosphatemia, hypertension, anemia, thrombocytopenia, and encephalopathy. PM&R was consulted to evaluate appropriateness for IPR admission.   Per report, patient lives in a hotel and intends to return there on discharge.  He is independent for mobility and ADLs at baseline, takes the bus to dialysis Monday Wednesday Friday, formally worked as a Production designer, theatre/television/film at [employer].  He has a friend [name] [name] who is available to intermittently check in on him 1 times per day, but denies any family or friends available for other assistance.  When discussing therapies and goals, he states he does not understand why he has not been walking with PT, denies awareness of any current deficits and thinks that he is safe to return home.  If discharge home is  not safe or possible, he would be agreeable to patient rehabilitation.  He denies any noncompliance with outpatient medications.   Review of Systems  Constitutional:  Negative for chills and fever.  HENT:  Negative for hearing loss.   Eyes:  Negative for blurred vision and double vision.  Respiratory:  Negative for cough and wheezing.   Cardiovascular:  Negative for chest pain and palpitations.  Gastrointestinal:  Negative for constipation, diarrhea, nausea and vomiting.  Genitourinary:  Negative for dysuria and urgency.  Musculoskeletal:  Positive for joint pain. Negative for neck pain.       Right shoulder pain  Skin:  Negative for rash.  Neurological:  Positive for seizures and weakness. Negative for sensory change and headaches.  Psychiatric/Behavioral:  Negative for suicidal ideas. The patient does not have insomnia.    Past Medical History:  Diagnosis Date   ESRD on hemodialysis (HCC)    Hypertension    Lupus    Seizures (HCC)    Seizures (HCC)    Stroke (HCC)    52 or 42 years of age.     Wears glasses    Past Surgical History:  Procedure Laterality Date   AV FISTULA PLACEMENT Left 12/30/2017   Procedure: ARTERIOVENOUS (AV) FISTULA CREATION VERSUS INSERTION OF ARTERIOVENOUS GRAFT LEFT ARM;  Surgeon: Larina Earthly, MD;  Location: [facility] OR;  Service: Vascular;  Laterality: Left;   BASCILIC VEIN TRANSPOSITION Left 08/15/2018   Procedure: LEFT FIRST STAGE BASCILIC VEIN TRANSPOSITION;  Surgeon: Larina Earthly, MD;  Location: [facility] OR;  Service: Vascular;  Laterality: Left;   BASCILIC VEIN TRANSPOSITION Left 10/30/2018   Procedure: INSERTION OF GORE-TEX GRAFT LEFT UPPER ARM;  Surgeon: Maeola Harman, MD;  Location: [facility] OR;  Service: Vascular;  Laterality: Left;   INSERTION OF DIALYSIS CATHETER N/A 09/20/2018   Procedure: INSERTION OF TUNNELED DIALYSIS CATHETER RIGHT INTERNAL JUGULAR;  Surgeon: Cephus Shelling, MD;  Location: [facility] OR;  Service: Vascular;  Laterality: N/A;   IR  FLUORO GUIDE CV LINE RIGHT  07/16/2022   IR US GUIDE VASC ACCESS RIGHT  07/16/2022   RENAL BIOPSY     Family History  Problem Relation Age of Onset   Diabetes Mother    Diabetes Maternal Grandmother    Seizures Neg Hx        not on mother's side. he doesn't know about his father's side   Social History:  reports that he has an unknown smoking status. He has never used smokeless tobacco. He reports that he does not currently use alcohol. He reports that he does not use drugs. Allergies:  Allergies  Allergen Reactions   Zyrtec [Cetirizine] Swelling    FACIAL SWELLING, NOT CATEGORIZED   Cetirizine & Related Swelling   Medications Prior to Admission  Medication Sig Dispense Refill   acetaminophen (TYLENOL) 325 MG tablet Take 2 tablets (650 mg total) by mouth every 6 (six) hours as needed for mild pain.     amLODipine (NORVASC) 10 MG tablet Take 1 tablet (10 mg total) by mouth at bedtime. For blood pressure 90 tablet 1   atorvastatin (LIPITOR) 40 MG tablet Take 1 tablet (40 mg total) by mouth daily. 90 tablet 0   carvedilol (COREG) 12.5 MG tablet Take 1 tablet (12.5 mg total) by mouth 2 (two) times daily with a meal. DOSE CHANGE 180 tablet 1   cinacalcet (SENSIPAR) 30 MG tablet Take 2 tablets (60 mg total) by mouth daily with supper. 180 tablet 0   cinacalcet (SENSIPAR) 90 MG tablet Take 90 mg by mouth daily.     docusate sodium (COLACE) 100 MG capsule Take 1 capsule (100 mg total) by mouth 2 (two) times daily as needed for mild constipation.     ELIQUIS 2.5 MG TABS tablet TAKE 1 TABLET(2.5 MG) BY MOUTH TWICE DAILY 180 tablet 1   levETIRAcetam (KEPPRA) 1000 MG tablet Take 1 tablet (1,000 mg total) by mouth daily. 30 tablet 0   levETIRAcetam (KEPPRA) 500 MG tablet Take 1 tablet (500 mg total) by mouth 2 (two) times daily. 180 tablet 0   midodrine (PROAMATINE) 5 MG tablet Take 1 tablet (5 mg total) by mouth 3 (three) times daily with meals. 270 tablet 0   multivitamin (RENA-VIT) TABS tablet  Take 1 tablet by mouth at bedtime. 100 tablet 0   rosuvastatin (CRESTOR) 40 MG tablet Take 40 mg by mouth daily.     sevelamer carbonate (RENVELA) 800 MG tablet Take 4 tablets (3,200 mg total) by mouth 3 (three) times daily with meals. 1080 tablet 0   warfarin (COUMADIN) 7.5 MG tablet Take 7.5mg  daily at 6pm 30 tablet 2    Home: Home Living Family/patient expects to be discharged to:: Private residence Living Arrangements: Alone Available Help at Discharge: Friend(s), Available PRN/intermittently Type of Home: Other(Comment) Home Access: Level entry (motel) Home Layout: One level Bathroom Shower/Tub: Engineer, manufacturing systems: Standard Bathroom Accessibility: Yes Home Equipment: Agricultural consultant (2 wheels)  Functional History: Prior Function Prior Level of Function : Working/employed, Independent/Modified Independent, Patient poor 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 Transfers Overall transfer level: Needs assistance Equipment used: Rolling walker (2 wheels) Transfers: Sit to/from Stand Sit to Stand: Min assist, +2 safety/equipment General transfer comment: Min assist for boost +2 for safety and equipment management. VC for technique and hand placement. Ambulation/Gait Ambulation/Gait assistance: Min assist, +2 safety/equipment Gait Distance (Feet): 200 Feet Assistive device: Rolling walker (2 wheels) Gait Pattern/deviations: Step-through pattern, Decreased stride length, Knee flexed in stance - right, Staggering left, Drifts right/left, Wide base of support General Gait Details: Intermittent min assist for balance, RW control, and frequent  VC for awareness of obstacles on Rt side due to inattention. Unstable when stepping backwards and with turns, demonstrates intermittent staggering. Challenged with cognitive tasks, unable to find room without major cues. No overt buckling however RLE demonstrates some minor instability. Gait velocity: decr Gait velocity interpretation: <1.31 ft/sec, indicative of household ambulator Pre-gait activities: static march, heel/toe raise    ADL: ADL Overall ADL's : Needs assistance/impaired Eating/Feeding: Set up Eating/Feeding Details (indicate cue type and reason): unaware of spilling food on chest; difficulty reaching tray in bed due to B shoulder deficits Grooming: Supervision/safety, Standing, Minimal assistance Upper Body Bathing: Set up, Supervision/ safety, Sitting Lower Body Bathing: Minimal assistance, Sit to/from stand Upper Body Dressing : Minimal assistance Lower Body Dressing: Moderate assistance, Sit to/from stand Toilet Transfer: Minimal assistance, Ambulation, Rolling walker (2 wheels) Toileting- Clothing Manipulation and Hygiene: Minimal assistance Functional mobility during ADLs: Minimal assistance, +2 for safety/equipment, Rolling walker (2 wheels), Cueing for safety, Cueing for sequencing  Cognition: Cognition Overall Cognitive Status: Impaired/Different from baseline Orientation Level: Oriented to person Cognition Arousal: Alert Behavior During Therapy: Flat affect Overall Cognitive Status: Impaired/Different from baseline Area of Impairment: Orientation, Attention, Memory, Following commands, Safety/judgement, Awareness, Problem solving Orientation Level: Disoriented to, Time ("December") Current Attention Level: Selective Memory: Decreased short-term memory Following Commands: Follows one step commands consistently Safety/Judgement: Decreased awareness of safety, Decreased awareness of deficits Awareness: Intellectual Problem Solving: Slow processing, Difficulty  sequencing, Requires verbal cues, Requires tactile cues  Blood pressure 102/63, pulse 88, temperature 98.4 F (36.9 C), temperature source Oral, resp. rate 18, height 6\' 1"  (1.854 m), weight 106.9 kg, SpO2 95%. Physical Exam Constitutional: No apparent distress. Appropriate appearance for age.  Sitting up in chair. HENT: No JVD. Neck Supple. Trachea midline. Atraumatic, normocephalic. Eyes: PERRLA. EOMI. Visual fields grossly intact. + Right beating nystagmus on leftward gaze Cardiovascular: RRR, no murmurs/rub/gallops. No Edema. Peripheral pulses 2+  Respiratory: CTAB. No rales, rhonchi, or wheezing. On RA.  Abdomen: + bowel sounds, normoactive. No distention or tenderness.  Skin: C/D/I. No apparent lesions. IV right arm C/D/I. MSK:      No apparent deformity.  Right shoulder abduction and flexion limited to approximately 45 degrees; guards with attempted passive range of motion.       Neurologic exam:  Cognition: AAO to person, place; partially oriented to time with month and year, not date. Language: Fluent, No substitutions or neoglisms. No dysarthria. Names 3/3 objects correctly.  Slowed, hypophonic speech. Memory: Recalls 1/3 objects at 5 minutes.  Insight: Poor insight into current condition.  Mood:flat affect, appropriate mood.  Sensation: To light touch intact in BL UEs and LEs  Reflexes: 2+ in BL UE and LEs. Negative Hoffman's and babinski signs bilaterally.  CN: Mild L facial droop. Coordination: No apparent tremors.  Mild left upper extremity ataxia. Spasticity: MAS 0 in all extremities.  Strength:                RUE: 2/5 SA, 5/5 EF, 5/5 EE, 5/5 WE, 5/5 FF, 5/5 FA                 LUE: 3/5 SA, 4/5 EF, 4/5 EE, 4/5 WE, 4/5 FF, 4/5 FA                 RLE: 5/5 HF, 5/5 KE, 5/5 DF, 5/5 EHL, 5/5 PF                 LLE:  5/5 HF, 5/5 KE, 5/5 DF, 5/5 EHL, 5/5 PF     Results for orders placed or performed during the hospital encounter of 03/27/23 (from the past 24 hour(s))  Glucose,  capillary     Status: Abnormal   Collection Time: 04/05/23 12:53 AM  Result Value Ref Range   Glucose-Capillary 136 (H) 70 - 99 mg/dL  Glucose, capillary     Status: None   Collection Time: 04/05/23  4:35 AM  Result Value Ref Range   Glucose-Capillary 89 70 - 99 mg/dL  Glucose, capillary     Status: None   Collection Time: 04/05/23  7:47 AM  Result Value Ref Range   Glucose-Capillary 92 70 - 99 mg/dL   Comment 1 Notify RN   Protime-INR     Status: Abnormal   Collection Time: 04/05/23  8:22 AM  Result Value Ref Range   Prothrombin Time 17.8 (H) 11.4 - 15.2 seconds   INR 1.4 (H) 0.8 - 1.2  Heparin level (unfractionated)     Status: None   Collection Time: 04/05/23  8:22 AM  Result Value Ref Range   Heparin Unfractionated 0.31 0.30 - 0.70 IU/mL  CBC     Status: Abnormal   Collection Time: 04/05/23  8:22 AM  Result Value Ref Range   WBC 7.1 4.0 - 10.5 K/uL   RBC 3.98 (L) 4.22 - 5.81 MIL/uL   Hemoglobin 11.8 (L) 13.0 - 17.0 g/dL   HCT 16.1 (L) 09.6 - 04.5 %   MCV 90.5 80.0 - 100.0 fL   MCH 29.6 26.0 - 34.0 pg   MCHC 32.8 30.0 - 36.0 g/dL   RDW 40.9 81.1 - 91.4 %   Platelets 100 (L) 150 - 400 K/uL   nRBC 0.0 0.0 - 0.2 %  Glucose, capillary     Status: None   Collection Time: 04/05/23  8:22 AM  Result Value Ref Range   Glucose-Capillary 87 70 - 99 mg/dL  Basic metabolic panel     Status: Abnormal   Collection Time: 04/05/23  8:22 AM  Result Value Ref Range   Sodium 132 (L) 135 - 145 mmol/L   Potassium 5.1 3.5 - 5.1 mmol/L   Chloride 92 (L) 98 - 111 mmol/L   CO2 22 22 - 32 mmol/L   Glucose, Bld 83 70 - 99 mg/dL   BUN 56 (H) 6 - 20 mg/dL   Creatinine, Ser 78.29 (H) 0.61 - 1.24 mg/dL   Calcium 9.7 8.9 - 56.2 mg/dL   GFR, Estimated 4 (L) >60 mL/min   Anion gap 18 (H) 5 - 15  Glucose, capillary     Status: None   Collection Time: 04/05/23 12:49 PM  Result Value Ref Range   Glucose-Capillary 98 70 - 99 mg/dL   EEG adult  Result Date: 04/04/2023 Charlsie Quest,  MD      04/04/2023  8:52 AM Patient Name: REMON LEVINE MRN: [id number] Epilepsy Attending: Charlsie Quest Referring Physician/Provider: Elease Etienne, MD Date: 04/04/2023 Duration: 21.20 mins Patient history: 42 yo M presented with seizures with HD. Patient has history antiphospholipid syndrome, leading to hypercoagulable state and causing hx of strokes. EEG to evaluate for seizure Level of alertness: Awake AEDs during EEG study: LEV Technical aspects: This EEG study was done with scalp electrodes positioned according to the 10-20 International system of electrode placement. Electrical activity was reviewed with band pass filter of 1-70Hz , sensitivity of 7 uV/mm, display speed of 69mm/sec with a 60Hz  notched filter applied as appropriate. EEG data were recorded continuously and digitally stored.  Video monitoring was available and reviewed as appropriate. Description: The posterior dominant rhythm consists of 8 Hz activity of moderate voltage (25-35 uV) seen predominantly in posterior head regions, symmetric and reactive to eye opening and eye closing. EEG showed continuous generalized 5 to 6 Hz theta slowing admixed with intermittent generalized high amplitude 2-3Hz  delta slowing. Hyperventilation and photic stimulation were not performed.   ABNORMALITY - Intermittent rhythmic delta slow, generalized - Continuous slow, generalized IMPRESSION: This study is suggestive of moderate diffuse encephalopathy. No seizures or epileptiform discharges were seen throughout the recording. Priyanka Annabelle Harman   CT HEAD WO CONTRAST ( )  Result Date: 04/03/2023 CLINICAL DATA:  Hypertension, altered mental status EXAM: CT HEAD WITHOUT CONTRAST TECHNIQUE: Contiguous axial images were obtained from the base of the skull through the vertex without intravenous contrast. RADIATION DOSE REDUCTION: This exam was performed according to the departmental dose-optimization program which includes automated exposure control, adjustment of  the mA and/or kV according to patient size and/or use of iterative reconstruction technique. COMPARISON:  03/27/2023 FINDINGS: Brain: No evidence of acute infarction, hemorrhage, mass, mass effect, or midline shift. No hydrocephalus or extra-axial fluid collection. Redemonstrated chronic left frontoparietal, left parietooccipital, and right parietooccipital encephalomalacia. Chronic infarcts in the left cerebellum. Periventricular white matter changes, likely the sequela of chronic small vessel ischemic disease. Vascular: No hyperdense vessel. Skull: Negative for fracture or focal lesion. Sinuses/Orbits: No acute finding. Other: The mastoid air cells are well aerated. IMPRESSION: No acute intracranial process. Electronically Signed   By: Wiliam Ke M.D.   On: 04/03/2023 20:42    Assessment/Plan: Diagnosis: CVA with multiple acute and subacute infarcts, likely d/t hypercoagulable state Does the need for close, 24 hr/day medical supervision in concert with the patient's rehab needs make it unreasonable for this patient to be served in a less intensive setting? Potentially Co-Morbidities requiring supervision/potential complications: Recurrent seizures, hypoxia, ESRD on HD, hyperphosphatemia, hypertension, anemia, thrombocytopenia, and encephalopathy Due to safety, disease management, medication administration, and patient education, does the patient require 24 hr/day rehab nursing? Potentially Does the patient require coordinated care of a physician, rehab nurse, therapy disciplines of PT, OT, and SLP to address physical and functional deficits in the context of the above medical diagnosis(es)? Yes Addressing deficits in the following areas: balance, endurance, locomotion, strength, transferring, bathing, dressing, feeding, grooming, toileting, cognition, and psychosocial support Can the patient actively participate in an intensive therapy program of at least 3 hrs of therapy per day at least 5 days per  week? Yes The potential for patient to make measurable gains while on inpatient rehab is excellent Anticipated functional outcomes upon discharge from inpatient rehab are modified independent  with PT, modified independent with OT, supervision and min assist with SLP. Estimated rehab length of stay  to reach the above functional goals is: 10-14 days Anticipated discharge destination:  SNF Overall Rehab/Functional Prognosis: good  POST ACUTE RECOMMENDATIONS: This patient's condition is appropriate for continued rehabilitative care in the following setting: SNF Patient has agreed to participate in recommended program. Potentially Note that insurance prior authorization may be required for reimbursement for recommended care.  Comment: Mr. Dubray has history of multiple CVAs due to antiphospholipid antibody syndrome, with questionable medication noncompliance, along with many hospitalizations and ED visits this year for uncontrolled seizures.  He currently lives in a hotel and only support is a friend who can check in on him once per day; feel that at this time due to his cognitive deficits and poor insight that this is not a safe dispo plan.  Patient wishes for a discharge home, but would be potentially agreeable to SNF or inpatient rehab if this is not possible.  Unfortunately, he is at high risk for rehospitalization.  I feel at this time patient is best candidate for SNF to allow some therapies and the proper assistance for cognitive and functional recovery, in order to reach a modified independent level needed for safe discharge home.   MEDICAL RECOMMENDATIONS: While patient denies any medication noncompliance contributing to his multiple strokes, agree with ongoing case management to assist in financial and community barriers contributing to the patient's recurrent hospitalizations. Antiepileptic medications can contribute to encephalopathy, however given significant difficulty controlling the  patient's seizures, this can be addressed as an outpatient. Recommend delirium precautions with promoting wakefulness during the day, frequent reorientation, limiting lines/tubes/drains, addressing pain and proper nutrition, and allowing patient a restful sleep at nighttime with minimal interruptions for vitals checks and sticks. Recommend Voltaren gel 4 times daily to right shoulder and mobilization with PT to prevent development of frozen shoulder, as the patient's joint mobility is already quite poor.  Given that that is not an acute change, do not feel x-ray or further workup is necessarily warranted unless patient status changes.   I have personally performed a face to face diagnostic evaluation of this patient. Additionally, I have examined the patient's medical record including any pertinent labs and radiographic images. If the physician assistant has documented in this note, I have reviewed and edited or otherwise concur with the physician assistant's documentation.  Thanks,  Angelina Sheriff, DO 04/05/2023
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
## 2023-04-05 NOTE — Progress Notes (Signed)
[facility] NEPHROLOGY PROGRESS NOTE  Assessment/ Plan: Pt is a 41 y.o. yo male   OP HD: [facility] MWF  4h   400/800   101.5kg   2/2 bath   [facility] TDC    Heparin none - last OP HD today 10/9, post wt 110.5kg (9kg over) - mircera 50 mcg IV q 2wks, last 10/2, due 10/16  - venofer 100mg  three times per week thru today - rocaltrol 2.0 mcg   # Seizures - acute on chronic, takes Keppra at home but misses doses. MRI with scattered infarcts - neurology implicates subtherapeutic INR.   # H/o antiphospholipid syndrome - hx SLE, has been tried on multiple anticoagulants in the past but struggles w/ medication adherence.  On heparin/coumadin bridge currently.   #ESRD - on HD MWF. HD yesterday off schedule with UF around 2.5 L.  Plan for next dialysis tomorrow and then back to MWF schedule next week.    # HTN/volume - BP's high on admission, normal/low now.  UF with HD, continue fluid restriction.  # Anemia ESRD - Hb 11- 13, no esa needs.   # CKD-MBD - CCa normalized, and phos is very high. Resumed binder and sensipar. Renal diet.  Subjective: Seen and examined at the bedside.  He denies nausea, vomiting, chest pain, shortness of breath.  No new event. Objective Vital signs in last 24 hours: Vitals:   04/04/23 2030 04/05/23 0053 04/05/23 0326 04/05/23 0746  BP:  (!) 125/94 (!) 129/91 133/89  Pulse:  77 83 77  Resp: (!) 25 18 20    Temp:  98 F (36.7 C) 99.1 F (37.3 C) 98.5 F (36.9 C)  TempSrc:  Oral Oral Oral  SpO2:  96% 94% 94%  Weight:      Height:       Weight change:   Intake/Output Summary (Last 24 hours) at 04/05/2023 1112 Last data filed at 04/05/2023 0815 Gross per 24 hour  Intake 542.09 ml  Output 2500 ml  Net -1957.91 ml       Labs: RENAL PANEL Recent Labs  Lab 03/29/23 1327 03/30/23 0437 04/01/23 0406 04/02/23 0800 04/03/23 0601 04/04/23 1134 04/05/23 0822  NA 137 136 136 136 133* 136 132*  K 6.1* 5.4* 4.8 5.5* 5.1 6.7* 5.1  CL 91* 94* 89* 88*  88* 94* 92*  CO2 24 21* 22 21* 23 17* 22  GLUCOSE 111* 109* 102* 93 86 101* 83  BUN 74* 69* 107* 120* 81* 107* 56*  CREATININE 18.08* 15.40* 21.49* 23.78* 17.63* 21.42* 14.24*  CALCIUM 10.5* 10.6* 10.7* 9.7 10.0 8.6* 9.7  MG  --  2.7*  --  3.0* 2.5*  --   --   PHOS 11.1* 9.9* 11.7* >30.0* 10.9* 11.9*  --   ALBUMIN 3.7  --  3.8 3.5  --  3.4*  --     Liver Function Tests: Recent Labs  Lab 04/01/23 0406 04/02/23 0800 04/04/23 1134  ALBUMIN 3.8 3.5 3.4*   No results for input(s): "LIPASE", "AMYLASE" in the last 168 hours. No results for input(s): "AMMONIA" in the last 168 hours. CBC: Recent Labs    04/01/23 0406 04/02/23 0800 04/03/23 0738 04/04/23 1134 04/05/23 0822  HGB 13.3 11.1* 12.3* 11.5* 11.8*  MCV 92.5 92.2 92.5 88.4 90.5    Cardiac Enzymes: No results for input(s): "CKTOTAL", "CKMB", "CKMBINDEX", "TROPONINI" in the last 168 hours. CBG: Recent Labs  Lab 04/04/23 1216 04/05/23 0053 04/05/23 0435 04/05/23 0747 04/05/23 0822  GLUCAP 96 136* 89  92 87    Iron Studies: No results for input(s): "IRON", "TIBC", "TRANSFERRIN", "FERRITIN" in the last 72 hours. Studies/Results: EEG adult  Result Date: 04/04/2023 Charlsie Quest, MD     04/04/2023  8:52 AM Patient Name: [name] [name] MRN: [id number] Epilepsy Attending: Charlsie Quest Referring Physician/Provider: Elease Etienne, MD Date: 04/04/2023 Duration: 21.20 mins Patient history: 42 yo M presented with seizures with HD. Patient has history antiphospholipid syndrome, leading to hypercoagulable state and causing hx of strokes. EEG to evaluate for seizure Level of alertness: Awake AEDs during EEG study: LEV Technical aspects: This EEG study was done with scalp electrodes positioned according to the 10-20 International system of electrode placement. Electrical activity was reviewed with band pass filter of 1-70Hz , sensitivity of 7 uV/mm, display speed of 27mm/sec with a 60Hz  notched filter applied as appropriate.  EEG data were recorded continuously and digitally stored.  Video monitoring was available and reviewed as appropriate. Description: The posterior dominant rhythm consists of 8 Hz activity of moderate voltage (25-35 uV) seen predominantly in posterior head regions, symmetric and reactive to eye opening and eye closing. EEG showed continuous generalized 5 to 6 Hz theta slowing admixed with intermittent generalized high amplitude 2-3Hz  delta slowing. Hyperventilation and photic stimulation were not performed.   ABNORMALITY - Intermittent rhythmic delta slow, generalized - Continuous slow, generalized IMPRESSION: This study is suggestive of moderate diffuse encephalopathy. No seizures or epileptiform discharges were seen throughout the recording. Priyanka Annabelle Harman   CT HEAD WO CONTRAST ( )  Result Date: 04/03/2023 CLINICAL DATA:  Hypertension, altered mental status EXAM: CT HEAD WITHOUT CONTRAST TECHNIQUE: Contiguous axial images were obtained from the base of the skull through the vertex without intravenous contrast. RADIATION DOSE REDUCTION: This exam was performed according to the departmental dose-optimization program which includes automated exposure control, adjustment of the mA and/or kV according to patient size and/or use of iterative reconstruction technique. COMPARISON:  03/27/2023 FINDINGS: Brain: No evidence of acute infarction, hemorrhage, mass, mass effect, or midline shift. No hydrocephalus or extra-axial fluid collection. Redemonstrated chronic left frontoparietal, left parietooccipital, and right parietooccipital encephalomalacia. Chronic infarcts in the left cerebellum. Periventricular white matter changes, likely the sequela of chronic small vessel ischemic disease. Vascular: No hyperdense vessel. Skull: Negative for fracture or focal lesion. Sinuses/Orbits: No acute finding. Other: The mastoid air cells are well aerated. IMPRESSION: No acute intracranial process. Electronically Signed   By:  Wiliam Ke M.D.   On: 04/03/2023 20:42    Medications: Infusions:  heparin 1,600 Units/hr (04/05/23 0300)    Scheduled Medications:  atorvastatin  40 mg Oral Daily   Chlorhexidine Gluconate Cloth  6 each Topical Q0600   cinacalcet  60 mg Oral Q supper   levETIRAcetam  500 mg Oral BID   sevelamer carbonate  3,200 mg Oral TID WC   warfarin  10 mg Oral ONCE-1600   Warfarin - Pharmacist Dosing Inpatient   Does not apply q1600    have reviewed scheduled and prn medications.  Physical Exam: General:NAD, comfortable Heart:RRR, s1s2 nl Lungs:clear b/l, no crackle Abdomen:soft, Non-tender, non-distended Extremities:No edema Dialysis Access: Right IJ TDC.  Aarnav Steagall Prasad Ibrahem Volkman 04/05/2023,11:12 AM  LOS: 9 days

## 2023-04-05 NOTE — Progress Notes (Signed)
PHARMACY - ANTICOAGULATION CONSULT NOTE  Pharmacy Consult for heparin bridge + warfarin Indication:  APLA syndrome  Allergies  Allergen Reactions   Zyrtec [Cetirizine] Swelling    FACIAL SWELLING, NOT CATEGORIZED   Cetirizine & Related Swelling    Patient Measurements: Height: 6\' 1"  (185.4 cm) Weight: 106.9 kg (235 lb 10.8 oz) IBW/kg (Calculated) : 79.9 Heparin Dosing Weight: 103 kg   Vital Signs: Temp: 98.5 F (36.9 C) (10/18 0746) Temp Source: Oral (10/18 0746) BP: 133/89 (10/18 0746) Pulse Rate: 77 (10/18 0746)  Labs: Recent Labs    04/03/23 0601 04/03/23 0601 04/03/23 0738 04/03/23 1929 04/04/23 0529 04/04/23 1134 04/05/23 0822  HGB  --    < > 12.3*  --   --  11.5* 11.8*  HCT  --   --  38.0*  --   --  33.4* 36.0*  PLT  --   --  108*  --   --  109* 100*  LABPROT 15.3*  --  15.4*  --  16.3*  --  17.8*  INR 1.2  --  1.2  --  1.3*  --  1.4*  HEPARINUNFRC 0.16*  --  0.15* 0.18* 0.28*  --  0.31  CREATININE 17.63*  --   --   --   --  21.42* 14.24*   < > = values in this interval not displayed.   Estimated Creatinine Clearance: 8.6 mL/min (A) (by C-G formula based on SCr of 14.24 mg/dL (H)).  Medical History: Past Medical History:  Diagnosis Date   ESRD on hemodialysis (HCC)    Hypertension    Lupus    Seizures (HCC)    Seizures (HCC)    Stroke (HCC)    18 or 42 years of age.     Wears glasses    Assessment: 42 yo M presented with seizures with HD. Patient has history antiphospholipid syndrome, leading to hypercoagulable state and causing hx of strokes. INR on admission 1.1 - most likely not taking Warfarin at home. Warfarin held and sq heparin initiated in interim. MRI showing multiple acute and subacute infarcts. Per MD, discussed with neurology and would like to start Warfarin and use heparin bridge until therapeutic.  10/18 AM: Heparin level therapeutic at 0.31 with heparin running at 1600 units/hour. No other issues with heparin infusion or signs of  bleeding reported. INR slowly increasing, 1.4 today after 5 doses of warfarin. S/p 10 mg dose yesterday. Will plan to repeat dose and reassess prior to increasing further.   Goal of Therapy:  Heparin level 0.3-0.5 units/ml INR 2.5-3.5 per neurology  Monitor platelets by anticoagulation protocol: Yes   Plan:   Continue heparin infusion at 1600 units/hr  Check heparin level daily while on heparin Give warfarin 10 mg PO x1 dose Check INR daily while on warfarin Continue to monitor H&H and platelets  Jani Gravel, PharmD Clinical Pharmacist  04/05/2023 11:12 AM

## 2023-04-05 NOTE — Plan of Care (Signed)
[facility] Tonsillectomy/Adenoidectomy, Postoperative PEDS care plan entered in error.

## 2023-04-05 NOTE — TOC Progression Note (Signed)
Transition of Care [facility]) - Progression Note    Patient Details  Name: [name] [name] MRN: [id number] Date of Birth: Nov 05, 1980  [facility] CM/SW Contact  Kermit Balo, RN Phone Number: 04/05/2023, 9:44 AM  Clinical Narrative:     Pt is from home alone. Non compliant with MD appts. Pt states he does go to his HD.  Per [facility] they can manage his coumadin INR. Will need an appt with [facility] at d/c.  Pt uses the bus for transportation.  TOC following.  Expected Discharge Plan: Home/Self Care    Expected Discharge Plan and Services                                               Social Determinants of Health (SDOH) Interventions SDOH Screenings   Food Insecurity: No Food Insecurity (04/01/2023)  Housing: Low Risk  (04/01/2023)  Transportation Needs: No Transportation Needs (04/01/2023)  Utilities: Not At Risk (04/01/2023)  Alcohol Screen: Low Risk  (03/26/2023)  Depression (PHQ2-9): Low Risk  (03/26/2023)  Financial Resource Strain: Low Risk  (03/26/2023)  Physical Activity: Inactive (03/26/2023)  Social Connections: Moderately Isolated (03/26/2023)  Stress: No Stress Concern Present (03/26/2023)  Tobacco Use: Unknown (04/01/2023)  Health Literacy: Adequate Health Literacy (03/26/2023)    Readmission Risk Interventions    03/28/2023    2:57 PM 01/25/2023    3:40 PM  Readmission Risk Prevention Plan  Post Dischage Appt    Medication Screening    Transportation Screening Complete   Medication Review (RN Care Manager) Referral to Pharmacy   PCP or Specialist appointment within 3-5 days of discharge Complete   HRI or Home Care Consult Complete   SW Recovery Care/Counseling Consult Complete   Palliative Care Screening Not Applicable   Skilled Nursing Facility Not Applicable      Information is confidential and restricted. Go to Review Flowsheets to unlock data.

## 2023-04-05 NOTE — Progress Notes (Signed)
Out Patient Arrangements:  Pt receives out-pt HD at [facility] on MWF. Will assist as needed.   Andree Elk [facility] [phone]

## 2023-04-05 NOTE — Plan of Care (Deleted)
[facility] Tonsillectomy / Adenoidectomy; Postoperative PEDS (Deleted)

## 2023-04-05 NOTE — Plan of Care (Signed)
  Problem: Education: Goal: Knowledge of disease or condition will improve Outcome: Progressing Goal: Knowledge of secondary prevention will improve (MUST DOCUMENT ALL) Outcome: Progressing Goal: Knowledge of patient specific risk factors will improve Loraine Leriche N/A or DELETE if not current risk factor) Outcome: Progressing   Problem: Ischemic Stroke/TIA Tissue Perfusion: Goal: Complications of ischemic stroke/TIA will be minimized Outcome: Progressing   Problem: Coping: Goal: Will verbalize positive feelings about self Outcome: Progressing Goal: Will identify appropriate support needs Outcome: Progressing   Problem: Health Behavior/Discharge Planning: Goal: Ability to manage health-related needs will improve Outcome: Progressing Goal: Goals will be collaboratively established with patient/family Outcome: Progressing   Problem: Self-Care: Goal: Ability to participate in self-care as condition permits will improve Outcome: Progressing Goal: Verbalization of feelings and concerns over difficulty with self-care will improve Outcome: Progressing Goal: Ability to communicate needs accurately will improve Outcome: Progressing   Problem: Nutrition: Goal: Risk of aspiration will decrease Outcome: Progressing Goal: Dietary intake will improve Outcome: Progressing   Problem: Education: Goal: Knowledge of General Education information will improve Description: Including pain rating scale, medication(s)/side effects and non-pharmacologic comfort measures Outcome: Progressing   Problem: Health Behavior/Discharge Planning: Goal: Ability to manage health-related needs will improve Outcome: Progressing   Problem: Clinical Measurements: Goal: Ability to maintain clinical measurements within normal limits will improve Outcome: Progressing Goal: Will remain free from infection Outcome: Progressing Goal: Diagnostic test results will improve Outcome: Progressing Goal: Respiratory  complications will improve Outcome: Progressing Goal: Cardiovascular complication will be avoided Outcome: Progressing   Problem: Activity: Goal: Risk for activity intolerance will decrease Outcome: Progressing   Problem: Nutrition: Goal: Adequate nutrition will be maintained Outcome: Progressing   Problem: Coping: Goal: Level of anxiety will decrease Outcome: Progressing   Problem: Elimination: Goal: Will not experience complications related to bowel motility Outcome: Progressing Goal: Will not experience complications related to urinary retention Outcome: Progressing   Problem: Pain Managment: Goal: General experience of comfort will improve Outcome: Progressing   Problem: Safety: Goal: Ability to remain free from injury will improve Outcome: Progressing   Problem: Skin Integrity: Goal: Risk for impaired skin integrity will decrease Outcome: Progressing   Problem: Education: Goal: Understanding of post-operative needs will improve Outcome: Progressing Goal: Individualized Educational Video(s) Outcome: Progressing   Problem: Clinical Measurements: Goal: Postoperative complications will be avoided or minimized Outcome: Progressing   Problem: Respiratory: Goal: Will regain and/or maintain adequate ventilation Outcome: Progressing

## 2023-04-05 NOTE — Progress Notes (Signed)
? ?  Inpatient Rehab Admissions Coordinator : ? ?Per therapy recommendations, patient was screened for CIR candidacy by Blue Ruggerio RN MSN.  At this time patient appears to be a potential candidate for CIR. I will place a rehab consult per protocol for full assessment. Please call me with any questions. ? ?Jerriyah Louis RN MSN ?Admissions Coordinator ?[phone] ?  ?

## 2023-04-05 NOTE — Progress Notes (Addendum)
Inpatient Rehab Admissions:  Inpatient Rehab Consult received.  I met with patient at the bedside for rehabilitation assessment and to discuss goals and expectations of an inpatient rehab admission.  Discussed average length of stay, insurance authorization requirement, discharge home after completion of CIR. Pt acknowledged understanding. Pt interested in pursuing CIR. Pt informed AC that his friend Cherene [name] could assist him after discharge. Pt gave permission to contact Crystal. AC spoke to [employer] on the telephone. Discussed CIR goals and expectations. She acknowledged understanding. She confirmed that she will be able to provide intermittent assistance for pt (1x/day). Will discuss dispo with physiatrist. Will continue to follow.   1609: After discussing dispo and rehab potential with physiatrist, pt is not an appropriate candidate for CIR. TOC made aware.    Signed: Wolfgang Phoenix, MS, CCC-SLP Admissions Coordinator [phone]
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
## 2023-04-06 DIAGNOSIS — R569 Unspecified convulsions: Secondary | ICD-10-CM | POA: Diagnosis not present

## 2023-04-06 LAB — CBC
HCT: 36 % — ABNORMAL LOW (ref 39.0–52.0)
Hemoglobin: 11.8 g/dL — ABNORMAL LOW (ref 13.0–17.0)
MCH: 29.7 pg (ref 26.0–34.0)
MCHC: 32.8 g/dL (ref 30.0–36.0)
MCV: 90.7 fL (ref 80.0–100.0)
Platelets: 111 10*3/uL — ABNORMAL LOW (ref 150–400)
RBC: 3.97 MIL/uL — ABNORMAL LOW (ref 4.22–5.81)
RDW: 14 % (ref 11.5–15.5)
WBC: 7.1 10*3/uL (ref 4.0–10.5)
nRBC: 0 % (ref 0.0–0.2)

## 2023-04-06 LAB — GLUCOSE, CAPILLARY
Glucose-Capillary: 103 mg/dL — ABNORMAL HIGH (ref 70–99)
Glucose-Capillary: 93 mg/dL (ref 70–99)
Glucose-Capillary: 133 mg/dL — ABNORMAL HIGH (ref 70–99)
Glucose-Capillary: 95 mg/dL (ref 70–99)

## 2023-04-06 LAB — HEPARIN LEVEL (UNFRACTIONATED): Heparin Unfractionated: 0.31 [IU]/mL (ref 0.30–0.70)

## 2023-04-06 LAB — PROTIME-INR
INR: 1.7 — ABNORMAL HIGH (ref 0.8–1.2)
Prothrombin Time: 20.2 s — ABNORMAL HIGH (ref 11.4–15.2)

## 2023-04-06 MED ORDER — WARFARIN SODIUM 5 MG PO TABS
10.0000 mg | ORAL_TABLET | Freq: Once | ORAL | Status: AC
  Administered 2023-04-06: 10 mg via ORAL
  Filled 2023-04-06: qty 2

## 2023-04-06 NOTE — Progress Notes (Signed)
   04/06/23 1315  Vitals  Temp 98 F (36.7 C)  Pulse Rate 93  Resp 14  BP 97/68  SpO2 92 %  O2 Device Room Air  Weight 102.5 kg  Type of Weight Post-Dialysis  Oxygen Therapy  Patient Activity (if Appropriate) In bed  Post Treatment  Dialyzer Clearance Lightly streaked  Hemodialysis Intake (mL) 0 mL  Liters Processed 74.4  Fluid Removed (mL) 2800 mL  Tolerated HD Treatment Yes   Received patient in bed to unit.  Alert and oriented.  Informed consent signed and in chart.   TX duration:3.5hrs  Patient tolerated well.  Transported back to the room  Alert, without acute distress.  Hand-off given to patient's nurse.   Access used: [facility] Access issues: none  Total UF removed: 2.8L Medication(s) given: none   [facility]
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
## 2023-04-06 NOTE — Progress Notes (Signed)
PHARMACY - ANTICOAGULATION CONSULT NOTE  Pharmacy Consult for heparin bridge + warfarin Indication:  APLA syndrome  Allergies  Allergen Reactions   Zyrtec [Cetirizine] Swelling    FACIAL SWELLING, NOT CATEGORIZED   Cetirizine & Related Swelling    Patient Measurements: Height: 6\' 1"  (185.4 cm) Weight: 106.1 kg (233 lb 14.5 oz) IBW/kg (Calculated) : 79.9 Heparin Dosing Weight: 103 kg   Vital Signs: Temp: 97.8 F (36.6 C) (10/19 0322) Temp Source: Axillary (10/19 0322) BP: 140/76 (10/19 0322) Pulse Rate: 78 (10/19 0322)  Labs: Recent Labs    04/04/23 0529 04/04/23 1134 04/04/23 1134 04/05/23 0822 04/06/23 0537  HGB  --  11.5*   < > 11.8* 11.8*  HCT  --  33.4*  --  36.0* 36.0*  PLT  --  109*  --  100* 111*  LABPROT 16.3*  --   --  17.8* 20.2*  INR 1.3*  --   --  1.4* 1.7*  HEPARINUNFRC 0.28*  --   --  0.31 0.31  CREATININE  --  21.42*  --  14.24*  --    < > = values in this interval not displayed.   Estimated Creatinine Clearance: 8.6 mL/min (A) (by C-G formula based on SCr of 14.24 mg/dL (H)).  Medical History: Past Medical History:  Diagnosis Date   ESRD on hemodialysis (HCC)    Hypertension    Lupus    Seizures (HCC)    Seizures (HCC)    Stroke (HCC)    44 or 42 years of age.     Wears glasses    Assessment: 42 yo M presented with seizures with HD. Patient has history antiphospholipid syndrome, leading to hypercoagulable state and causing hx of strokes. INR on admission 1.1 - most likely not taking Warfarin at home. Warfarin held and sq heparin initiated in interim. MRI showing multiple acute and subacute infarcts. Per MD, discussed with neurology and would like to start Warfarin and use heparin bridge until therapeutic.  10/19 AM: Heparin level therapeutic at 0.31 with heparin running at 1600 units/hour. No other issues with heparin infusion or signs of bleeding reported per nursing team. INR increasing, 1.7 today after 6 doses of warfarin. S/p 10 mg dose  yesterday. Will plan to repeat dose and reassess prior to increasing further.   Goal of Therapy:  Heparin level 0.3-0.5 units/ml INR 2.5-3.5 per neurology  Monitor platelets by anticoagulation protocol: Yes   Plan:   Continue heparin infusion at 1600 units/hr  Check heparin level daily while on heparin Give warfarin 10 mg PO x1 dose Check INR daily while on warfarin Continue to monitor H&H and platelets  Roslyn Smiling, PharmD PGY1 Pharmacy Resident 04/06/2023 7:51 AM

## 2023-04-06 NOTE — Procedures (Signed)
Patient was seen on dialysis and the procedure was supervised.  BFR 400  Via TDC BP is  119/97.   Patient appears to be tolerating treatment well. D/w HD nurse.  [name] [name] 04/06/2023

## 2023-04-07 DIAGNOSIS — R569 Unspecified convulsions: Secondary | ICD-10-CM | POA: Diagnosis not present

## 2023-04-07 LAB — CBC
HCT: 37.2 % — ABNORMAL LOW (ref 39.0–52.0)
Hemoglobin: 12.2 g/dL — ABNORMAL LOW (ref 13.0–17.0)
MCH: 30.3 pg (ref 26.0–34.0)
MCHC: 32.8 g/dL (ref 30.0–36.0)
MCV: 92.3 fL (ref 80.0–100.0)
Platelets: 161 10*3/uL (ref 150–400)
RBC: 4.03 MIL/uL — ABNORMAL LOW (ref 4.22–5.81)
RDW: 14 % (ref 11.5–15.5)
WBC: 8.7 10*3/uL (ref 4.0–10.5)
nRBC: 0.2 % (ref 0.0–0.2)

## 2023-04-07 LAB — HEPARIN LEVEL (UNFRACTIONATED)
Heparin Unfractionated: >1.1 [IU]/mL — ABNORMAL HIGH (ref 0.30–0.70)
Heparin Unfractionated: <0.1 [IU]/mL — ABNORMAL LOW (ref 0.30–0.70)
Heparin Unfractionated: <0.1 [IU]/mL — ABNORMAL LOW (ref 0.30–0.70)

## 2023-04-07 LAB — GLUCOSE, CAPILLARY
Glucose-Capillary: 103 mg/dL — ABNORMAL HIGH (ref 70–99)
Glucose-Capillary: 110 mg/dL — ABNORMAL HIGH (ref 70–99)
Glucose-Capillary: 128 mg/dL — ABNORMAL HIGH (ref 70–99)
Glucose-Capillary: 137 mg/dL — ABNORMAL HIGH (ref 70–99)
Glucose-Capillary: 95 mg/dL (ref 70–99)
Glucose-Capillary: 99 mg/dL (ref 70–99)

## 2023-04-07 LAB — PROTIME-INR
INR: 1.9 — ABNORMAL HIGH (ref 0.8–1.2)
Prothrombin Time: 22 s — ABNORMAL HIGH (ref 11.4–15.2)

## 2023-04-07 MED ORDER — CHLORHEXIDINE GLUCONATE CLOTH 2 % EX PADS
6.0000 | MEDICATED_PAD | Freq: Every day | CUTANEOUS | Status: DC
  Administered 2023-04-08 – 2023-04-16 (×9): 6 via TOPICAL

## 2023-04-07 MED ORDER — WARFARIN SODIUM 5 MG PO TABS
10.0000 mg | ORAL_TABLET | Freq: Once | ORAL | Status: AC
  Administered 2023-04-07: 10 mg via ORAL
  Filled 2023-04-07: qty 2

## 2023-04-07 NOTE — Progress Notes (Signed)
1      PROGRESS NOTE   [name] [name]  URK:[id number]    DOB: 05/19/1981    DOA: 03/27/2023  PCP: Claiborne Rigg, NP   I have briefly reviewed patients previous medical records in [facility].  Chief Complaint  Patient presents with   Altered Mental Status   Hypertension    Brief Hospital Course:  42 years old male with PMH significant for essential hypertension, ESRD on hemodialysis, SLE with APLA previously on Eliquis, CVA, chronic thrombocytopenia, anemia of chronic disease, history of seizures, history of medication noncompliance presented to the ED from dialysis for not acting himself during dialysis.  Patient was found to have active seizures in the ER with tongue biting and was hypoxic with SpO2 of 60% on room air which improved to 100% on nonrebreather and was treated with IV Ativan.  Patient remained postictal in the ER occasionally agitated.  He was loaded with Keppra.  Neurology was consulted.  CT head without acute bleed but possibly shows new stroke.  Patient had multiple ER visits for breakthrough seizures.  MRI showed acute infarct of the left PCA territory, no hemorrhage or mass effect.  Patient was admitted in the ICU. TRH pickup 03/29/2023  Medically optimized and awaiting DC pending therapeutic INR with heparin bridging and warfarin.  CIR screen patient and not appropriate for their department.  PT OT to reassess & make new recommendations.     Assessment & Plan:  Principal Problem:   Seizure [facility]) Active Problems:   Cerebrovascular accident (CVA) due to embolism of cerebral artery (HCC)   Seizure with postictal encephalopathy/history of seizures: History of complex partial seizures. Multiple ED visits for breakthrough seizures.  Questionable compliance-reportedly misses about 3-4 [place]ul[place]oy of Keppra per week. Admitted for seizure and aspiration pneumonitis.  Received Ativan and Keppra load EEG showed moderate to diffuse encephalopathy, no seizures or  epileptiform discharges. Continue Keppra 500 Mg twice daily. Seizure precautions.  No further seizures reported.  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 Prior left PCA infarct in August 2024.  Carotid Dopplers at that time unremarkable.  Had been discharged on Eliquis and Lipitor. MRI showed multiple acute and subacute infarcts.  Per neurology, likely secondary to antiphospholipid syndrome on Coumadin with subtherapeutic INR and noncompliance. Noncompliant to warfarin, now on warfarin with heparin IV bridge. Compliance has been counseled this admission. Continue Olanzapine prn for agitation PT and OT order placed Due to somnolence on 10/16, obtained CT head and EEG without acute abnormalities.  Mental status has improved and stable over the last 48 hours. Per discussion with OT and their input, has cognitive impairment and physical impairment, recommending CIR-but per CIR, not a candidate for their department.  TOC to address disposition tomorrow.  Acute hypoxic respiratory failure: Likely multifactorial. Resolved.   ESRD on MWF HD/hyperkalemia Nephrology follow-up appreciated. HD on 10/16 as a rollover from yesterday and again next HD on 10/17. Underwent HD 10/17 and next HD 10/19.  Hyperkalemia of 6.7 has resolved. Seen at HD on 10/19.  Metabolic bone disease Significant hyperphosphatemia >30.  Nephrology resuming binder and Sensipar.  Phosphorus 11.9.   Essential hypertension: Controlled on as needed hydralazine.  Hyperlipidemia: Continue Lipitor, again without compliance.  Anemia in ESRD: Stable.  Thrombocytopenia Appears new compared to August 2024.  Fluctuating but relatively stable.  Acute encephalopathy: Noted on 10/16. CT head without acute findings.  EEG suggestive of moderate diffuse encephalopathy.  No seizures or epileptiform discharges. Alert and oriented.  No agitation.  Cognition impaired.  As discussed with OT, no safety sitter  required, discontinued.   Antiphospholipid Syndrome  SLE  Poor Med Adherence: Patient has tried multiple anticoagulants including warfarin, eliquis, xarelto, but struggles w/ med adherence.  Looks like he was most recently taking Warfarin, but INR on admission suggests pt was not taking regularly. Although warfarin is first line for antiphopsolipid syndrome, given pt has poor follow-up and poor med adherence, Eliquis may be a better option.  Discussed with Neurology , Patient resumed on coumadin.  Appears that he has been noncompliant with several meds. As per neurology note, given patient's ESRD on dialysis and antiphospholipid syndrome, Coumadin may be the best option and he may not have much choices. Currently on IV heparin bridge and Coumadin.  INR 1.9.  Goal 2.5-3.5 and may take several days to get there.  Body mass index is 29.81 kg/m.     DVT prophylaxis: SCDs Start: 03/27/23 1703     Code Status: Full Code:  Family Communication: None at bedside. Disposition:  Status is: Inpatient Remains inpatient appropriate because: Subtherapeutic INR, heparin bridge, mental status changes.  INR should be at goal in the next 48 hours hopefully.     Consultants:   Nephrology Neurology  Procedures:     Antimicrobials:      Subjective:  Denies complaints.  Objective:   Vitals:   04/06/23 2030 04/07/23 0050 04/07/23 0848 04/07/23 1127  BP: 98/60 96/62 102/69 92/62  Pulse: 77 82 81 90  Resp: 14 16 18 18   Temp: 98 F (36.7 C) 98.3 F (36.8 C) 98 F (36.7 C) 97.9 F (36.6 C)  TempSrc: Oral Oral Oral Oral  SpO2: 98% 98% 98% 98%  Weight:      Height:       Stable exam without much change over the last couple of days.  General exam: Young male, moderately built and nourished lying comfortably propped up in bed without distress. Respiratory system: Clear to auscultation.  No increased work of breathing. Cardiovascular system: S1 & S2 heard, RRR. No JVD, murmurs, rubs,  gallops or clicks. No pedal edema.  Off of telemetry now. Gastrointestinal system: Abdomen is nondistended, soft and nontender. No organomegaly or masses felt. Normal bowel sounds heard. Central nervous system: Alert and oriented.  No focal neurological deficits. Extremities: Symmetric 5 x 5 power. Skin: No rashes, lesions or ulcers Psychiatry: Judgement and insight impaired. Mood & affect flat.     Data Reviewed:   I have personally reviewed following labs and imaging studies   CBC: Recent Labs  Lab 04/04/23 1134 04/05/23 0822 04/06/23 0537 04/07/23 0751  WBC 9.3 7.1 7.1 8.7  NEUTROABS 6.7  --   --   --   HGB 11.5* 11.8* 11.8* 12.2*  HCT 33.4* 36.0* 36.0* 37.2*  MCV 88.4 90.5 90.7 92.3  PLT 109* 100* 111* 161    Basic Metabolic Panel: Recent Labs  Lab 04/01/23 0406 04/02/23 0800 04/03/23 0601 04/04/23 1134 04/05/23 0822  NA 136 136 133* 136 132*  K 4.8 5.5* 5.1 6.7* 5.1  CL 89* 88* 88* 94* 92*  CO2 22 21* 23 17* 22  GLUCOSE 102* 93 86 101* 83  BUN 107* 120* 81* 107* 56*  CREATININE 21.49* 23.78* 17.63* 21.42* 14.24*  CALCIUM 10.7* 9.7 10.0 8.6* 9.7  MG  --  3.0* 2.5*  --   --   PHOS 11.7* >30.0* 10.9* 11.9*  --     Liver Function Tests: Recent Labs  Lab 04/01/23 0406 04/02/23 0800 04/04/23 1134  ALBUMIN 3.8 3.5 3.4*    CBG: Recent Labs  Lab 04/06/23 2045 04/07/23 0019 04/07/23 0336  GLUCAP 95 137* 128*    Microbiology Studies:  No results found for this or any previous visit (from the past 240 hour(s)).  Radiology Studies:  No results found.  Scheduled Meds:    atorvastatin  40 mg Oral Daily   Chlorhexidine Gluconate Cloth  6 each Topical Q0600   cinacalcet  60 mg Oral Q supper   levETIRAcetam  500 mg Oral BID   sevelamer carbonate  3,200 mg Oral TID WC   warfarin  10 mg Oral ONCE-1600   Warfarin - Pharmacist Dosing Inpatient   Does not apply q1600    Continuous Infusions:    heparin 1,600 Units/hr (04/07/23 1201)     LOS: 11  days     Marcellus Scott, MD,  FACP, [facility], [facility], [name]   [facility] Advisor [place]      To contact the attending provider between 7A-7P or the covering provider during after hours 7P-7A, please log into the web site [email or web address] and access using universal [place] password for that web site. If you do not have the password, please call the hospital operator.  04/07/2023, 1:53 PM

## 2023-04-07 NOTE — Progress Notes (Signed)
PHARMACY - ANTICOAGULATION CONSULT NOTE  Pharmacy Consult for heparin bridge + warfarin Indication:  APLA syndrome  Allergies  Allergen Reactions   Zyrtec [Cetirizine] Swelling    FACIAL SWELLING, NOT CATEGORIZED   Cetirizine & Related Swelling    Patient Measurements: Height: 6\' 1"  (185.4 cm) Weight: 102.5 kg (225 lb 15.5 oz) IBW/kg (Calculated) : 79.9 Heparin Dosing Weight: 103 kg   Vital Signs: Temp: 98 F (36.7 C) (10/20 1539) Temp Source: Oral (10/20 1539) BP: 110/79 (10/20 1539) Pulse Rate: 83 (10/20 1539)  Labs: Recent Labs    04/05/23 0822 04/06/23 0537 04/07/23 0751 04/07/23 1040 04/07/23 1812  HGB 11.8* 11.8* 12.2*  --   --   HCT 36.0* 36.0* 37.2*  --   --   PLT 100* 111* 161  --   --   LABPROT 17.8* 20.2* 22.0*  --   --   INR 1.4* 1.7* 1.9*  --   --   HEPARINUNFRC 0.31 0.31 >1.10* <0.10* <0.10*  CREATININE 14.24*  --   --   --   --    Estimated Creatinine Clearance: 8.4 mL/min (A) (by C-G formula based on SCr of 14.24 mg/dL (H)).  Medical History: Past Medical History:  Diagnosis Date   ESRD on hemodialysis (HCC)    Hypertension    Lupus    Seizures (HCC)    Seizures (HCC)    Stroke (HCC)    1 or 42 years of age.     Wears glasses    Assessment: 42 yo M presented with seizures with HD. Patient has history antiphospholipid syndrome, leading to hypercoagulable state and causing hx of strokes. INR on admission 1.1 - most likely not taking Warfarin at home. Warfarin held and sq heparin initiated in interim. MRI showing multiple acute and subacute infarcts. Per MD, discussed with neurology and would like to start warfarin and use heparin bridge until therapeutic.  10/20 AM: Heparin level supratherapeutic at >1.10. Repeat level undetectable (<0.1). Per RN, heparin was stopped yesterday at an unknown time and never resumed. Was not able to determine heparin was stopped from nursing team. No issues with IV access or signs/symptoms of bleeding per RN.  Heparin was supposed to be running at 1600 units/hour. Resuming heparin at previous therapeutic rate.   10/20 PM: Heparin level remains undetectable on infusion at 1600 units/hr. Heparin level drawn ~6 hours post gtt start so may not accurately reflect accumulation in pt with ESRD. Noted pt with low therapeutic level on this rate a couple of days ago.  Goal of Therapy:  Heparin level 0.3-0.5 units/ml INR 2.5-3.5 per neurology  Monitor platelets by anticoagulation protocol: Yes   Plan:  Increase heparin infusion conservatively to 1700 units/hr  F/u heparin level in 8 hours  Christoper Fabian, PharmD, BCPS Please see amion for complete clinical pharmacist phone list 04/07/2023 7:24 PM

## 2023-04-07 NOTE — Progress Notes (Signed)
[facility] NEPHROLOGY PROGRESS NOTE  Assessment/ Plan: Pt is a 42 y.o. yo male   OP HD: [facility] MWF  4h   400/800   101.5kg   2/2 bath   RIJ TDC    Heparin none - last OP HD today 10/9, post wt 110.5kg (9kg over) - mircera 50 mcg IV q 2wks, last 10/2, due 10/16  - venofer 100mg  three times per week thru today - rocaltrol 2.0 mcg   # Seizures - acute on chronic, takes Keppra at home but misses doses. MRI with scattered infarcts - neurology implicates subtherapeutic INR.   # H/o antiphospholipid syndrome - hx SLE, has been tried on multiple anticoagulants in the past but struggles w/ medication adherence.  On heparin/coumadin bridge currently.   #ESRD - on HD MWF.  Next dialysis tomorrow.  # HTN/volume - BP's high on admission, normal/low now.  UF with HD, continue fluid restriction.  # Anemia ESRD - Hb 11- 13, no esa needs.   # CKD-MBD - CCa normalized, and phos is very high. Resumed binder and sensipar. Renal diet.  Subjective: Seen and examined at the bedside.  Denies nausea, vomiting, chest pain or shortness of breath.  No new event. Objective Vital signs in last 24 hours: Vitals:   04/06/23 1651 04/06/23 2030 04/07/23 0050 04/07/23 0848  BP: (!) 87/64 98/60 96/62  102/69  Pulse: 93 77 82 81  Resp: 17 14 16 18   Temp: 98.3 F (36.8 C) 98 F (36.7 C) 98.3 F (36.8 C) 98 F (36.7 C)  TempSrc: Oral Oral Oral Oral  SpO2: 95% 98% 98% 98%  Weight:      Height:       Weight change: -0.7 kg  Intake/Output Summary (Last 24 hours) at 04/07/2023 1112 Last data filed at 04/06/2023 1345 Gross per 24 hour  Intake 236 ml  Output 2800 ml  Net -2564 ml       Labs: RENAL PANEL Recent Labs  Lab 04/01/23 0406 04/02/23 0800 04/03/23 0601 04/04/23 1134 04/05/23 0822  NA 136 136 133* 136 132*  K 4.8 5.5* 5.1 6.7* 5.1  CL 89* 88* 88* 94* 92*  CO2 22 21* 23 17* 22  GLUCOSE 102* 93 86 101* 83  BUN 107* 120* 81* 107* 56*  CREATININE 21.49* 23.78* 17.63* 21.42*  14.24*  CALCIUM 10.7* 9.7 10.0 8.6* 9.7  MG  --  3.0* 2.5*  --   --   PHOS 11.7* >30.0* 10.9* 11.9*  --   ALBUMIN 3.8 3.5  --  3.4*  --     Liver Function Tests: Recent Labs  Lab 04/01/23 0406 04/02/23 0800 04/04/23 1134  ALBUMIN 3.8 3.5 3.4*   No results for input(s): "LIPASE", "AMYLASE" in the last 168 hours. No results for input(s): "AMMONIA" in the last 168 hours. CBC: Recent Labs    04/03/23 0738 04/04/23 1134 04/05/23 0822 04/06/23 0537 04/07/23 0751  HGB 12.3* 11.5* 11.8* 11.8* 12.2*  MCV 92.5 88.4 90.5 90.7 92.3    Cardiac Enzymes: No results for input(s): "CKTOTAL", "CKMB", "CKMBINDEX", "TROPONINI" in the last 168 hours. CBG: Recent Labs  Lab 04/06/23 0814 04/06/23 1645 04/06/23 2045 04/07/23 0019 04/07/23 0336  GLUCAP 93 133* 95 137* 128*    Iron Studies: No results for input(s): "IRON", "TIBC", "TRANSFERRIN", "FERRITIN" in the last 72 hours. Studies/Results: No results found.  Medications: Infusions:  heparin 1,600 Units/hr (04/06/23 0553)    Scheduled Medications:  atorvastatin  40 mg Oral Daily   Chlorhexidine Gluconate  Cloth  6 each Topical [id number]   cinacalcet  60 mg Oral Q supper   levETIRAcetam  500 mg Oral BID   sevelamer carbonate  3,200 mg Oral TID WC   Warfarin - Pharmacist Dosing Inpatient   Does not apply q1600    have reviewed scheduled and prn medications.  Physical Exam: General:NAD, comfortable Heart:RRR, s1s2 nl Lungs:clear b/l, no crackle Abdomen:soft, Non-tender, non-distended Extremities:No edema Dialysis Access: Right IJ TDC.  Brain Honeycutt Prasad Dayven Linsley 04/07/2023,11:12 AM  LOS: 11 days

## 2023-04-07 NOTE — Progress Notes (Signed)
PHARMACY - ANTICOAGULATION CONSULT NOTE  Pharmacy Consult for heparin bridge + warfarin Indication:  APLA syndrome  Allergies  Allergen Reactions   Zyrtec [Cetirizine] Swelling    FACIAL SWELLING, NOT CATEGORIZED   Cetirizine & Related Swelling    Patient Measurements: Height: 6\' 1"  (185.4 cm) Weight: 102.5 kg (225 lb 15.5 oz) IBW/kg (Calculated) : 79.9 Heparin Dosing Weight: 103 kg   Vital Signs: Temp: 97.9 F (36.6 C) (10/20 1127) Temp Source: Oral (10/20 1127) BP: 92/62 (10/20 1127) Pulse Rate: 90 (10/20 1127)  Labs: Recent Labs    04/05/23 0822 04/06/23 0537 04/07/23 0751 04/07/23 1040  HGB 11.8* 11.8* 12.2*  --   HCT 36.0* 36.0* 37.2*  --   PLT 100* 111* 161  --   LABPROT 17.8* 20.2* 22.0*  --   INR 1.4* 1.7* 1.9*  --   HEPARINUNFRC 0.31 0.31 >1.10* <0.10*  CREATININE 14.24*  --   --   --    Estimated Creatinine Clearance: 8.4 mL/min (A) (by C-G formula based on SCr of 14.24 mg/dL (H)).  Medical History: Past Medical History:  Diagnosis Date   ESRD on hemodialysis (HCC)    Hypertension    Lupus    Seizures (HCC)    Seizures (HCC)    Stroke (HCC)    69 or 42 years of age.     Wears glasses    Assessment: 42 yo M presented with seizures with HD. Patient has history antiphospholipid syndrome, leading to hypercoagulable state and causing hx of strokes. INR on admission 1.1 - most likely not taking Warfarin at home. Warfarin held and sq heparin initiated in interim. MRI showing multiple acute and subacute infarcts. Per MD, discussed with neurology and would like to start warfarin and use heparin bridge until therapeutic.  10/20 AM: Heparin level supratherapeutic at >1.10. Repeat level undetectable (<0.1). Per RN, heparin was stopped yesterday at an unknown time and never resumed. Was not able to determine heparin was stopped from nursing team. No issues with IV access or signs/symptoms of bleeding per RN. Heparin was supposed to be running at 1600 units/hour.  Resuming heparin at previous therapeutic rate.   INR increasing, 1.9 today after 7 doses of warfarin. S/p 10 mg dose yesterday. Will plan to repeat dose and reassess prior to increasing further.   Goal of Therapy:  Heparin level 0.3-0.5 units/ml INR 2.5-3.5 per neurology  Monitor platelets by anticoagulation protocol: Yes   Plan:  Resume heparin infusion at 1600 units/hr  Recheck heparin level in 8 hours   Check heparin level daily while on heparin Give warfarin 10 mg PO x1 dose Check INR daily while on warfarin Continue to monitor H&H and platelets  Roslyn Smiling, PharmD PGY1 Pharmacy Resident 04/07/2023 11:47 AM

## 2023-04-08 DIAGNOSIS — R569 Unspecified convulsions: Secondary | ICD-10-CM | POA: Diagnosis not present

## 2023-04-08 LAB — GLUCOSE, CAPILLARY
Glucose-Capillary: 157 mg/dL — ABNORMAL HIGH (ref 70–99)
Glucose-Capillary: 166 mg/dL — ABNORMAL HIGH (ref 70–99)
Glucose-Capillary: 85 mg/dL (ref 70–99)
Glucose-Capillary: 93 mg/dL (ref 70–99)
Glucose-Capillary: 94 mg/dL (ref 70–99)
Glucose-Capillary: 99 mg/dL (ref 70–99)

## 2023-04-08 LAB — RENAL FUNCTION PANEL
Albumin: 3.4 g/dL — ABNORMAL LOW (ref 3.5–5.0)
Anion gap: 18 — ABNORMAL HIGH (ref 5–15)
BUN: 82 mg/dL — ABNORMAL HIGH (ref 6–20)
CO2: 23 mmol/L (ref 22–32)
Calcium: 9.3 mg/dL (ref 8.9–10.3)
Chloride: 91 mmol/L — ABNORMAL LOW (ref 98–111)
Creatinine, Ser: 16.46 mg/dL — ABNORMAL HIGH (ref 0.61–1.24)
GFR, Estimated: 3 mL/min — ABNORMAL LOW (ref >60)
Glucose, Bld: 94 mg/dL (ref 70–99)
Phosphorus: 9.1 mg/dL — ABNORMAL HIGH (ref 2.5–4.6)
Potassium: 5.2 mmol/L — ABNORMAL HIGH (ref 3.5–5.1)
Sodium: 132 mmol/L — ABNORMAL LOW (ref 135–145)

## 2023-04-08 LAB — PROTIME-INR
INR: 2.5 — ABNORMAL HIGH (ref 0.8–1.2)
Prothrombin Time: 27.2 s — ABNORMAL HIGH (ref 11.4–15.2)

## 2023-04-08 LAB — CBC
HCT: 36.6 % — ABNORMAL LOW (ref 39.0–52.0)
Hemoglobin: 11.9 g/dL — ABNORMAL LOW (ref 13.0–17.0)
MCH: 29.9 pg (ref 26.0–34.0)
MCHC: 32.5 g/dL (ref 30.0–36.0)
MCV: 92 fL (ref 80.0–100.0)
Platelets: 174 10*3/uL (ref 150–400)
RBC: 3.98 MIL/uL — ABNORMAL LOW (ref 4.22–5.81)
RDW: 14 % (ref 11.5–15.5)
WBC: 7.6 10*3/uL (ref 4.0–10.5)
nRBC: 0 % (ref 0.0–0.2)

## 2023-04-08 LAB — HEPARIN LEVEL (UNFRACTIONATED): Heparin Unfractionated: 0.23 [IU]/mL — ABNORMAL LOW (ref 0.30–0.70)

## 2023-04-08 MED ORDER — ALBUMIN HUMAN 25 % IV SOLN
25.0000 g | Freq: Once | INTRAVENOUS | Status: AC
  Administered 2023-04-08: 25 g via INTRAVENOUS
  Filled 2023-04-08: qty 100

## 2023-04-08 MED ORDER — ANTICOAGULANT SODIUM CITRATE 4% (200MG/5ML) IV SOLN: 5.0000 mL | Status: DC | PRN

## 2023-04-08 MED ORDER — PENTAFLUOROPROP-TETRAFLUOROETH EX AERO: 1.0000 | INHALATION_SPRAY | CUTANEOUS | Status: DC | PRN

## 2023-04-08 MED ORDER — ALTEPLASE 2 MG IJ SOLR: 2.0000 mg | Freq: Once | INTRAMUSCULAR | Status: DC | PRN

## 2023-04-08 MED ORDER — WARFARIN SODIUM 5 MG PO TABS
10.0000 mg | ORAL_TABLET | Freq: Once | ORAL | Status: AC
  Administered 2023-04-08: 10 mg via ORAL
  Filled 2023-04-08: qty 2

## 2023-04-08 MED ORDER — HEPARIN SODIUM (PORCINE) 1000 UNIT/ML DIALYSIS
1000.0000 [IU] | INTRAMUSCULAR | Status: DC | PRN
  Administered 2023-04-08: 3200 [IU]
  Filled 2023-04-08: qty 1

## 2023-04-08 MED ORDER — LIDOCAINE-PRILOCAINE 2.5-2.5 % EX CREA: 1.0000 | TOPICAL_CREAM | CUTANEOUS | Status: DC | PRN

## 2023-04-08 MED ORDER — LIDOCAINE HCL (PF) 1 % IJ SOLN: 5.0000 mL | INTRAMUSCULAR | Status: DC | PRN

## 2023-04-08 MED ORDER — MELATONIN 3 MG PO TABS
3.0000 mg | ORAL_TABLET | Freq: Every evening | ORAL | Status: DC | PRN
  Administered 2023-04-08 – 2023-04-19 (×12): 3 mg via ORAL
  Filled 2023-04-08 (×12): qty 1

## 2023-04-08 NOTE — Progress Notes (Signed)
Patient back to room from dialysis

## 2023-04-08 NOTE — Progress Notes (Signed)
[facility] NEPHROLOGY PROGRESS NOTE  Assessment/ Plan: Seizures - acute on chronic, takes Keppra at home but misses doses. MRI with scattered infarcts - neurology implicates subtherapeutic INR.   H/o antiphospholipid syndrome - hx SLE, has been tried on multiple anticoagulants in the past but struggles w/ medication adherence.  On heparin/coumadin bridge currently.   ESRD - on HD MWF.  Next dialysis today  HTN/volume - BP's high on admission, normal/low now.  UF with HD as tolerated, continue fluid restriction.  Anemia of ESRD - Hb 11- 13, no esa needs.   CKD-MBD - CCa normalized, and phos is very high. Resumed binder and sensipar. Renal diet.  OP HD: [facility] MWF  4h   400/800   101.5kg   2/2 bath   RIJ TDC    Heparin none - last OP HD today 10/9, post wt 110.5kg (9kg over) - mircera 50 mcg IV q 2wks, last 10/2, due 10/16  - venofer 100mg  three times per week thru today - rocaltrol 2.0 mcg   [name], MD [facility]   Subjective: Seen and examined at the bedside. No complaints, no acute events overnight.  Objective Vital signs in last 24 hours: Vitals:   04/07/23 1539 04/07/23 1958 04/08/23 0025 04/08/23 0544  BP: 110/79 123/77 113/66 99/60  Pulse: 83 84 80 78  Resp: 18 18 14 16   Temp: 98 F (36.7 C) 98.9 F (37.2 C) 98.8 F (37.1 C) 99 F (37.2 C)  TempSrc: Oral Oral Oral Oral  SpO2: 99% 96% 96% 94%  Weight:      Height:       Weight change:   Intake/Output Summary (Last 24 hours) at 04/08/2023 0839 Last data filed at 04/08/2023 0500 Gross per 24 hour  Intake 946.41 ml  Output --  Net 946.41 ml       Labs: RENAL PANEL Recent Labs  Lab 04/02/23 0800 04/03/23 0601 04/04/23 1134 04/05/23 0822  NA 136 133* 136 132*  K 5.5* 5.1 6.7* 5.1  CL 88* 88* 94* 92*  CO2 21* 23 17* 22  GLUCOSE 93 86 101* 83  BUN 120* 81* 107* 56*  CREATININE 23.78* 17.63* 21.42* 14.24*  CALCIUM 9.7 10.0 8.6* 9.7  MG 3.0* 2.5*  --   --   PHOS  >30.0* 10.9* 11.9*  --   ALBUMIN 3.5  --  3.4*  --     Liver Function Tests: Recent Labs  Lab 04/02/23 0800 04/04/23 1134  ALBUMIN 3.5 3.4*   No results for input(s): "LIPASE", "AMYLASE" in the last 168 hours. No results for input(s): "AMMONIA" in the last 168 hours. CBC: Recent Labs    04/04/23 1134 04/05/23 0822 04/06/23 0537 04/07/23 0751 04/08/23 0749  HGB 11.5* 11.8* 11.8* 12.2* 11.9*  MCV 88.4 90.5 90.7 92.3 92.0    Cardiac Enzymes: No results for input(s): "CKTOTAL", "CKMB", "CKMBINDEX", "TROPONINI" in the last 168 hours. CBG: Recent Labs  Lab 04/07/23 1127 04/07/23 1540 04/07/23 2031 04/07/23 2357 04/08/23 0346  GLUCAP 103* 110* 95 94 93    Iron Studies: No results for input(s): "IRON", "TIBC", "TRANSFERRIN", "FERRITIN" in the last 72 hours. Studies/Results: No results found.  Medications: Infusions:  heparin 1,700 Units/hr (04/08/23 0500)    Scheduled Medications:  atorvastatin  40 mg Oral Daily   Chlorhexidine Gluconate Cloth  6 each Topical Q0600   cinacalcet  60 mg Oral Q supper   levETIRAcetam  500 mg Oral BID   sevelamer carbonate  3,200 mg Oral  TID WC   Warfarin - Pharmacist Dosing Inpatient   Does not apply q1600    have reviewed scheduled and prn medications.  Physical Exam: General:NAD, comfortable Heart:RRR, s1s2 Lungs: cta bl Abdomen:soft, Non-tender, non-distended Extremities:No edema Dialysis Access: Right IJ TDC, nonfunctional lue avf  Toluwani Ruder 04/08/2023,8:39 AM  LOS: 12 days

## 2023-04-08 NOTE — Progress Notes (Signed)
SLP Cancellation Note  Patient Details Name: [name] [name] MRN: [id number] DOB: 1980-07-18   Cancelled treatment:       Reason Eval/Treat Not Completed: Patient at procedure or test/unavailable. Will continue attempts to f/u.    Gwynneth Aliment, M.A., CF-SLP Speech Language Pathology, Acute Rehabilitation Services  Secure Chat preferred [phone]  04/08/2023, 1:50 PM

## 2023-04-08 NOTE — Progress Notes (Signed)
PHARMACY - ANTICOAGULATION CONSULT NOTE  Pharmacy Consult for heparin bridge + warfarin Indication:  APLA syndrome  Allergies  Allergen Reactions   Zyrtec [Cetirizine] Swelling    FACIAL SWELLING, NOT CATEGORIZED   Cetirizine & Related Swelling    Patient Measurements: Height: 6\' 1"  (185.4 cm) Weight: 102.5 kg (225 lb 15.5 oz) IBW/kg (Calculated) : 79.9 Heparin Dosing Weight: 103 kg   Vital Signs: Temp: 99 F (37.2 C) (10/21 0544) Temp Source: Oral (10/21 0544) BP: 99/60 (10/21 0544) Pulse Rate: 78 (10/21 0544)  Labs: Recent Labs    04/06/23 0537 04/07/23 0751 04/07/23 1040 04/07/23 1812 04/08/23 0749  HGB 11.8* 12.2*  --   --  11.9*  HCT 36.0* 37.2*  --   --  36.6*  PLT 111* 161  --   --  174  LABPROT 20.2* 22.0*  --   --  27.2*  INR 1.7* 1.9*  --   --  2.5*  HEPARINUNFRC 0.31 >1.10* <0.10* <0.10* 0.23*   Estimated Creatinine Clearance: 8.4 mL/min (A) (by C-G formula based on SCr of 14.24 mg/dL (H)).  Medical History: Past Medical History:  Diagnosis Date   ESRD on hemodialysis (HCC)    Hypertension    Lupus    Seizures (HCC)    Seizures (HCC)    Stroke (HCC)    42 or 42 years of age.     Wears glasses    Assessment: 42 yo M presented with seizures with HD. Patient has history antiphospholipid syndrome, leading to hypercoagulable state and causing hx of strokes. INR on admission 1.1 - most likely not taking Warfarin at home. Warfarin held and sq heparin initiated in interim. MRI showing multiple acute and subacute infarcts. Per MD, discussed with neurology and would like to start warfarin and use heparin bridge until therapeutic.  10/21 AM: Heparin level subtherapeutic at 0.23 with heparin running at 1,700 units/hour. INR now at goal x 1 at 2.5 following multiple 10 mg doses. No signs of bleeding or issues with heparin infusion noted. CBC stable.   Goal of Therapy:  Heparin level 0.3-0.5 units/ml INR 2.5-3.5 per neurology  Monitor platelets by  anticoagulation protocol: Yes   Plan:  Increase heparin infusion to 1,800 units/hour  Warfarin 10 mg PO x1  F/U 8 hour heparin level  CBC/HL daily    Jani Gravel, PharmD Clinical Pharmacist  04/08/2023 8:38 AM

## 2023-04-08 NOTE — Progress Notes (Signed)
PT Cancellation Note  Patient Details Name: [name] [name] MRN: [id number] DOB: 07-15-1980   Cancelled Treatment:    Reason Eval/Treat Not Completed: Patient at procedure or test/unavailable  Afternoon follow-up for attempt with physical therapy.  Patient still off unit. Will attempt progress tomorrow.   [name], PT, DPT [facility]  Rehabilitation Services Physical Therapist Office: [phone] Website: [email or web address]  [name] 04/08/2023, 4:26 PM

## 2023-04-08 NOTE — [place] FL2 (Addendum)
[place] MEDICAID FL2 LEVEL OF CARE FORM     IDENTIFICATION  Patient Name: [name] [name] Birthdate: 04/20/1981 Sex: male Admission Date (Current Location): 03/27/2023  [facility] and [place] Number:  Producer, television/film/video and Address:  [facility] [place]. [facility], [address], [place], [place] [address]      Provider Number: [id number]  Attending Physician Name and Address:  Elease Etienne, MD  Relative Name and Phone Number:       Current Level of Care: Hospital Recommended Level of Care: Skilled Nursing Facility Prior Approval Number:    Date Approved/Denied:   PASRR Number: [id number]  Discharge Plan: SNF    Current Diagnoses: Patient Active Problem List   Diagnosis Date Noted   Cerebrovascular accident (CVA) due to embolism of cerebral artery (HCC) 04/04/2023   Seizure (HCC) 01/19/2023   Acute respiratory failure with hypoxia (HCC) 01/19/2023   Status epilepticus (HCC) 08/10/2022   ESRD on hemodialysis (HCC) 02/15/2022   Other disorders of phosphorus metabolism 08/18/2021   Fluid overload, unspecified 05/10/2021   Allergy, unspecified, initial encounter 04/19/2021   Anaphylactic shock, unspecified, sequela 04/19/2021   Pruritus, unspecified 04/19/2021   Hypocalcemia 04/01/2021   Long-term current use of anticonvulsant 08/04/2020   COVID-19 07/26/2020   Breakthrough seizure (HCC) 07/20/2020   Acute respiratory failure with hypoxia (HCC)    Lupus    Seizure, late effect of stroke (HCC) 08/26/2019   Secondary hyperparathyroidism of renal origin (HCC) 11/05/2018   Anemia in chronic kidney disease 09/23/2018   ESRD (end stage renal disease) (HCC) 03/18/2018   Prediabetes 01/10/2018   Chest pain in adult    CKD (chronic kidney disease) stage 3, GFR 30-59 ml/min (HCC) 10/24/2015   Seizures (HCC) 10/24/2015   HTN (hypertension) 01/12/2015   Chronic anticoagulation 04/24/2011   Lupus anticoagulant positive 04/24/2011   OBESITY, NOS 08/15/2006    THROMBOCYTOPENIA 08/15/2006   TOBACCO DEPENDENCE 08/15/2006   CVA 08/15/2006    Orientation RESPIRATION BLADDER Height & Weight     Self, Time, Place  Normal Continent Weight: 225 lb 15.5 oz (102.5 kg) Height:  6\' 1"  (185.4 cm)  BEHAVIORAL SYMPTOMS/MOOD NEUROLOGICAL BOWEL NUTRITION STATUS      Continent Diet (renal with fluid restriction at 1200 mL)  AMBULATORY STATUS COMMUNICATION OF NEEDS Skin   Limited Assist Verbally Normal                       Personal Care Assistance Level of Assistance  Bathing, Feeding, Dressing Bathing Assistance: Limited assistance Feeding assistance: Limited assistance Dressing Assistance: Limited assistance     Functional Limitations Info             SPECIAL CARE FACTORS FREQUENCY  PT (By licensed PT), OT (By licensed OT)     PT Frequency: 5x/wk OT Frequency: 5x/wk            Contractures Contractures Info: Not present    Additional Factors Info  Code Status, Allergies Code Status Info: Full Allergies Info: Zyrtec (Cetirizine), Cetirizine & Related           Current Medications (04/08/2023):  This is the current hospital active medication list Current Facility-Administered Medications  Medication Dose Route Frequency Provider Last Rate Last Admin   acetaminophen (TYLENOL) tablet 650 mg  650 mg Oral Q6H PRN Willeen Niece, MD   650 mg at 04/02/23 2035   alteplase (CATHFLO ACTIVASE) injection 2 mg  2 mg Intracatheter Once PRN Maxie Barb, MD  anticoagulant sodium citrate solution 5 mL  5 mL Intracatheter PRN Maxie Barb, MD       atorvastatin (LIPITOR) tablet 40 mg  40 mg Oral Daily Marvel Plan, MD   40 mg at 04/08/23 4010   Chlorhexidine Gluconate Cloth 2 % PADS 6 each  6 each Topical Q0600 Maxie Barb, MD   6 each at 04/08/23 0554   cinacalcet (SENSIPAR) tablet 60 mg  60 mg Oral Q supper Delano Metz, MD   60 mg at 04/07/23 1910   docusate sodium (COLACE) capsule 100 mg  100 mg Oral  BID PRN Selmer Dominion B, NP       heparin ADULT infusion 100 units/mL (25000 units/222mL)  1,800 Units/hr Intravenous Continuous Paytes, Florentina Addison, RPH 18 mL/hr at 04/08/23 0906 1,800 Units/hr at 04/08/23 0906   heparin injection 1,000 Units  1,000 Units Intracatheter PRN Maxie Barb, MD       hydrALAZINE (APRESOLINE) injection 10 mg  10 mg Intravenous Q4H PRN Patrici Ranks, MD   10 mg at 03/28/23 1305   levETIRAcetam (KEPPRA) tablet 500 mg  500 mg Oral BID Erick Blinks, MD   500 mg at 04/08/23 2725   lidocaine (PF) (XYLOCAINE) 1 % injection 5 mL  5 mL Intradermal PRN Maxie Barb, MD       lidocaine-prilocaine (EMLA) cream 1 Application  1 Application Topical PRN Maxie Barb, MD       LORazepam (ATIVAN) injection 2 mg  2 mg Intravenous Q4H PRN Selmer Dominion B, NP       OLANZapine (ZYPREXA) injection 5 mg  5 mg Intravenous Q8H PRN Paliwal, Aditya, MD   5 mg at 03/31/23 2238   ondansetron (ZOFRAN) injection 4 mg  4 mg Intravenous Q6H PRN Sundil, Subrina, MD   4 mg at 03/30/23 0110   pentafluoroprop-tetrafluoroeth (GEBAUERS) aerosol 1 Application  1 Application Topical PRN Maxie Barb, MD       polyethylene glycol (MIRALAX / GLYCOLAX) packet 17 g  17 g Oral Daily PRN Selmer Dominion B, NP       sevelamer carbonate (RENVELA) tablet 3,200 mg  3,200 mg Oral TID WC Delano Metz, MD   3,200 mg at 04/08/23 3664   warfarin (COUMADIN) tablet 10 mg  10 mg Oral ONCE-1600 Paytes, Florentina Addison, RPH       Warfarin - Pharmacist Dosing Inpatient   Does not apply q1600 Verdene Rio, [facility]   Given at 03/31/23 4034     Discharge Medications: Please see discharge summary for a list of discharge medications.  Relevant Imaging Results:  Relevant Lab Results:   Additional Information SS#: [id number]; HD MWF at [name], LCSW

## 2023-04-08 NOTE — Progress Notes (Signed)
1      PROGRESS NOTE   [name] [name]  OZD:[id number]    DOB: [birth year]    DOA: 03/27/2023  PCP: Claiborne Rigg, NP   I have briefly reviewed patients previous medical records in [facility].  Chief Complaint  Patient presents with   Altered Mental Status   Hypertension    Brief Hospital Course:  42 years old male with PMH significant for essential hypertension, ESRD on hemodialysis, SLE with APLA previously on Eliquis, CVA, chronic thrombocytopenia, anemia of chronic disease, history of seizures, history of medication noncompliance presented to the ED from dialysis for not acting himself during dialysis.  Patient was found to have active seizures in the ER with tongue biting and was hypoxic with SpO2 of 60% on room air which improved to 100% on nonrebreather and was treated with IV Ativan.  Patient remained postictal in the ER occasionally agitated.  He was loaded with Keppra.  Neurology was consulted.  CT head without acute bleed but possibly shows new stroke.  Patient had multiple ER visits for breakthrough seizures.  MRI showed acute infarct of the left PCA territory, no hemorrhage or mass effect.  Patient was admitted in the ICU. TRH pickup 03/29/2023  Medically optimized and awaiting DC pending therapeutic INR with heparin bridging and warfarin.  CIR screen patient and not appropriate for their department.  PT OT to reassess & make new recommendations. TOC working up for SNF, has cognitive/insight issues and unsafe to discharge home by self.     Assessment & Plan:  Principal Problem:   Seizure [facility]) Active Problems:   Cerebrovascular accident (CVA) due to embolism of cerebral artery (HCC)   Seizure with postictal encephalopathy/history of seizures: History of complex partial seizures. Multiple ED visits for breakthrough seizures.  Questionable compliance-reportedly misses about 3-4 [place] of Keppra per week. Admitted for seizure and aspiration pneumonitis.  Received  Ativan and Keppra load EEG showed moderate to diffuse encephalopathy, no seizures or epileptiform discharges. Continue Keppra 500 Mg twice daily. Seizure precautions.  No further seizures reported.  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 Prior left PCA infarct in August 2024.  Carotid Dopplers at that time unremarkable.  Had been discharged on Eliquis and Lipitor. MRI showed multiple acute and subacute infarcts.  Per neurology, likely secondary to antiphospholipid syndrome on Coumadin with subtherapeutic INR and noncompliance. Noncompliant to warfarin, now on warfarin with heparin IV bridge. Compliance has been counseled this admission. Continue Olanzapine prn for agitation PT and OT order placed Due to somnolence on 10/16, obtained CT head and EEG without acute abnormalities.  Mental status has improved and stable over the last 48 hours. Per discussion with OT and their input, has cognitive impairment and physical impairment, recommending CIR-but per CIR, not a candidate for their department.  TOC working on SNF,  Acute hypoxic respiratory failure: Likely multifactorial. Resolved.   ESRD on MWF HD/hyperkalemia Nephrology following for HD needs Stable  Metabolic bone disease Significant hyperphosphatemia >30.  Nephrology resuming binder and Sensipar.  Phosphorus 11.9.   Essential hypertension: Controlled on as needed hydralazine.  Hyperlipidemia: Continue Lipitor, again without compliance.  Anemia in ESRD: Stable.  Thrombocytopenia Appears new compared to August 2024.  Resolved.  Acute encephalopathy: Noted on 10/16. CT head without acute findings.  EEG suggestive of moderate diffuse encephalopathy.  No seizures or epileptiform discharges. Alert and oriented.  No agitation.  Cognition impaired.  As discussed with OT, no safety sitter required, discontinued. Poor insight.  Antiphospholipid Syndrome  SLE  Poor Med Adherence: Patient has tried  multiple anticoagulants including warfarin, eliquis, xarelto, but struggles w/ med adherence.  Looks like he was most recently taking Warfarin, but INR on admission suggests pt was not taking regularly. Although warfarin is first line for antiphopsolipid syndrome, given pt has poor follow-up and poor med adherence, Eliquis may be a better option.  Discussed with Neurology , Patient resumed on coumadin.  Appears that he has been noncompliant with several meds. As per neurology note, given patient's ESRD on dialysis and antiphospholipid syndrome, Coumadin may be the best option and he may not have much choices. Currently on IV heparin bridge and Coumadin.  INR 2.5.  Goal 2.5-3.5. ? DC IV Heparin, Mx per Pharmacy.  Body mass index is 30.57 kg/m.     DVT prophylaxis: SCDs Start: 03/27/23 1703     Code Status: Full Code:  Family Communication: None at bedside. Disposition:  From a medical standpoint, he should be stable for DC as early as 10/22 since his INR is now therapeutic.  However unsafe disposition to home, declined by CIR, awaiting SNF option being pursued by TOC.     Consultants:   Nephrology Neurology  Procedures:     Antimicrobials:      Subjective:  Seen this morning.  No sitter at bedside.  Denies complaints.  Indicates that his dialysis days are Mondays, Wednesdays and Fridays and he is supposed to go for dialysis today.  Objective:   Vitals:   04/08/23 1345 04/08/23 1400 04/08/23 1415 04/08/23 1435  BP: 93/80 99/75 (!) 88/74 (!) 89/55  Pulse: 82 80 81 81  Resp: 12 13 17 18   Temp:      TempSrc:      SpO2: 95% 96% 96% 96%  Weight:      Height:        General exam: Young male, moderately built and nourished lying comfortably propped up in bed without distress. Respiratory system: Clear to auscultation.  No increased work of breathing. Cardiovascular system: S1 & S2 heard, RRR. No JVD, murmurs, rubs, gallops or clicks. No pedal edema.  Off of telemetry  now. Gastrointestinal system: Abdomen is nondistended, soft and nontender. No organomegaly or masses felt. Normal bowel sounds heard. Central nervous system: Alert and oriented.  No focal neurological deficits. Extremities: Symmetric 5 x 5 power. Skin: No rashes, lesions or ulcers Psychiatry: Judgement and insight impaired. Mood & affect flat.     Data Reviewed:   I have personally reviewed following labs and imaging studies   CBC: Recent Labs  Lab 04/04/23 1134 04/05/23 0822 04/06/23 0537 04/07/23 0751 04/08/23 0749  WBC 9.3   < > 7.1 8.7 7.6  NEUTROABS 6.7  --   --   --   --   HGB 11.5*   < > 11.8* 12.2* 11.9*  HCT 33.4*   < > 36.0* 37.2* 36.6*  MCV 88.4   < > 90.7 92.3 92.0  PLT 109*   < > 111* 161 174   < > = values in this interval not displayed.    Basic Metabolic Panel: Recent Labs  Lab 04/02/23 0800 04/03/23 0601 04/04/23 1134 04/05/23 0822 04/08/23 1159  NA 136 133* 136 132* 132*  K 5.5* 5.1 6.7* 5.1 5.2*  CL 88* 88* 94* 92* 91*  CO2 21* 23 17* 22 23  GLUCOSE 93 86 101* 83 94  BUN 120* 81* 107* 56* 82*  CREATININE 23.78* 17.63* 21.42* 14.24* 16.46*  CALCIUM  9.7 10.0 8.6* 9.7 9.3  MG 3.0* 2.5*  --   --   --   PHOS >30.0* 10.9* 11.9*  --  9.1*    Liver Function Tests: Recent Labs  Lab 04/02/23 0800 04/04/23 1134 04/08/23 1159  ALBUMIN 3.5 3.4* 3.4*    CBG: Recent Labs  Lab 04/07/23 2357 04/08/23 0346 04/08/23 0842  GLUCAP 94 93 85    Microbiology Studies:  No results found for this or any previous visit (from the past 240 hour(s)).  Radiology Studies:  No results found.  Scheduled Meds:    atorvastatin  40 mg Oral Daily   Chlorhexidine Gluconate Cloth  6 each Topical Q0600   cinacalcet  60 mg Oral Q supper   levETIRAcetam  500 mg Oral BID   sevelamer carbonate  3,200 mg Oral TID WC   warfarin  10 mg Oral ONCE-1600   Warfarin - Pharmacist Dosing Inpatient   Does not apply q1600    Continuous Infusions:    anticoagulant  sodium citrate     heparin 1,800 Units/hr (04/08/23 0906)     LOS: 12 days     Marcellus Scott, MD,  FACP, [facility], [facility], [facility]   [facility] Advisor [place]      To contact the attending provider between 7A-7P or the covering provider during after hours 7P-7A, please log into the web site [email or web address] and access using universal [place] password for that web site. If you do not have the password, please call the hospital operator.  04/08/2023, 3:08 PM

## 2023-04-08 NOTE — Progress Notes (Signed)
Received patient in bed to unit.  Alert and oriented.  Informed consent signed and in chart.   TX duration:3  Patient didn't tolerate tx well today. Blood pressure dropped several times throughout tx. Per MD advise 25G of albumin were administered. 100 ML Saline bolus was also adminitered. Goal was not met.  Transported back to the room  Alert, without acute distress.  Hand-off given to patient's nurse.   Access used: catheter Access issues: n/a  Total UF removed: Medication(s) given: Albumin , 100 ML bolus  Post HD weight: 103.3kg   04/08/23 1530  Vitals  Temp (!) 97.5 F (36.4 C)  Temp Source Oral  BP (!) 84/66  MAP (mmHg) 74  Pulse Rate 84  ECG Heart Rate 84  Resp 16  Oxygen Therapy  SpO2 98 %  O2 Device Room Air  During Treatment Monitoring  Blood Flow Rate (mL/min) 399 mL/min  Arterial Pressure (mmHg) -11.71 mmHg  Venous Pressure (mmHg) 212.31 mmHg  TMP (mmHg) -4.64 mmHg  Ultrafiltration Rate (mL/min) 0 mL/min  Dialysate Flow Rate (mL/min) 299 ml/min  Dialysate Potassium Concentration 2  Dialysate Calcium Concentration 2.5  Duration of HD Treatment -hour(s) 3 hour(s)  Cumulative Fluid Removed (mL) per Treatment  912.35  HD Safety Checks Performed Yes  Intra-Hemodialysis Comments Tx completed  Post Treatment  Dialyzer Clearance Lightly streaked  Hemodialysis Intake (mL) 0 mL  Liters Processed 72  Fluid Removed (mL) 900 mL  Tolerated HD Treatment No (Comment) (blood pressure kept dropping, reqwuired albumin and 100 ml bolus)  Hemodialysis Catheter Right Internal jugular Double lumen Permanent (Tunneled)  Placement Date/Time: 07/16/22 1625   Serial / Lot #: [id number]  Expiration Date: 03/21/27  Time Out: Correct patient;Correct site;Correct procedure  Maximum sterile barrier precautions: Hand hygiene;Cap;Mask;Sterile gown;Sterile gloves;Large sterile ...  Site Condition No complications  Blue Lumen Status Heparin locked  Red Lumen Status Heparin locked   Catheter fill solution Heparin 1000 units/ml  Catheter fill volume (Arterial) 1.6 cc  Catheter fill volume (Venous) 1.6  Dressing Type Transparent  Dressing Status Antimicrobial disc in place  Dressing Change Due 04/15/23  Post treatment catheter status Capped and Clamped         [name]

## 2023-04-08 NOTE — Progress Notes (Signed)
Patient off floor to dialysis

## 2023-04-08 NOTE — Progress Notes (Signed)
PT Cancellation Note  Patient Details Name: [name] [name] MRN: [id number] DOB: 1981/04/30   Cancelled Treatment:    Reason Eval/Treat Not Completed: Patient at procedure or test/unavailable Off unit for dialysis this morning. Will attempt to follow-up for PT session this afternoon as schedule allows.  [name], PT, DPT [facility]  Rehabilitation Services Physical Therapist Office: [phone] Website: [email or web address]   [name] 04/08/2023, 11:55 AM

## 2023-04-09 DIAGNOSIS — R569 Unspecified convulsions: Secondary | ICD-10-CM | POA: Diagnosis not present

## 2023-04-09 LAB — HEPARIN LEVEL (UNFRACTIONATED): Heparin Unfractionated: 0.27 [IU]/mL — ABNORMAL LOW (ref 0.30–0.70)

## 2023-04-09 LAB — CBC
HCT: 32.8 % — ABNORMAL LOW (ref 39.0–52.0)
Hemoglobin: 10.7 g/dL — ABNORMAL LOW (ref 13.0–17.0)
MCH: 29.9 pg (ref 26.0–34.0)
MCHC: 32.6 g/dL (ref 30.0–36.0)
MCV: 91.6 fL (ref 80.0–100.0)
Platelets: 209 10*3/uL (ref 150–400)
RBC: 3.58 MIL/uL — ABNORMAL LOW (ref 4.22–5.81)
RDW: 14 % (ref 11.5–15.5)
WBC: 9 10*3/uL (ref 4.0–10.5)
nRBC: 0 % (ref 0.0–0.2)

## 2023-04-09 LAB — GLUCOSE, CAPILLARY
Glucose-Capillary: 80 mg/dL (ref 70–99)
Glucose-Capillary: 77 mg/dL (ref 70–99)
Glucose-Capillary: 105 mg/dL — ABNORMAL HIGH (ref 70–99)
Glucose-Capillary: 106 mg/dL — ABNORMAL HIGH (ref 70–99)
Glucose-Capillary: 79 mg/dL (ref 70–99)
Glucose-Capillary: 77 mg/dL (ref 70–99)

## 2023-04-09 LAB — PROTIME-INR
INR: 3 — ABNORMAL HIGH (ref 0.8–1.2)
Prothrombin Time: 31.6 s — ABNORMAL HIGH (ref 11.4–15.2)

## 2023-04-09 MED ORDER — WARFARIN SODIUM 7.5 MG PO TABS
7.5000 mg | ORAL_TABLET | Freq: Once | ORAL | Status: AC
  Administered 2023-04-09: 7.5 mg via ORAL
  Filled 2023-04-09: qty 1

## 2023-04-09 NOTE — Progress Notes (Signed)
1      PROGRESS NOTE   [name] [name]  ZOX:[id number]    DOB: [birth year]    DOA: 03/27/2023  PCP: Claiborne Rigg, NP   I have briefly reviewed patients previous medical records in [facility].  Chief Complaint  Patient presents with   Altered Mental Status   Hypertension    Brief Hospital Course:  42 years old male with PMH significant for essential hypertension, ESRD on hemodialysis, SLE with APLA previously on Eliquis, CVA, chronic thrombocytopenia, anemia of chronic disease, history of seizures, history of medication noncompliance presented to the ED from dialysis for not acting himself during dialysis.  Patient was found to have active seizures in the ER with tongue biting and was hypoxic with SpO2 of 60% on room air which improved to 100% on nonrebreather and was treated with IV Ativan.  Patient remained postictal in the ER occasionally agitated.  He was loaded with Keppra.  Neurology was consulted.  CT head without acute bleed but possibly shows new stroke.  Patient had multiple ER visits for breakthrough seizures.  MRI showed acute infarct of the left PCA territory, no hemorrhage or mass effect.  Patient was admitted in the ICU. TRH pickup 03/29/2023  Medically optimized and NR therapeutic at 3 on 10/22.  CIR declined.  TOC working up for SNF, has cognitive/insight issues and unsafe to discharge home by self.  DC pending bed availability.     Assessment & Plan:  Principal Problem:   Seizure [facility]) Active Problems:   Cerebrovascular accident (CVA) due to embolism of cerebral artery (HCC)   Seizure with postictal encephalopathy/history of seizures: History of complex partial seizures. Multiple ED visits for breakthrough seizures.  Questionable compliance-reportedly misses about 3-4 [place]ul[place]oy of Keppra per week. Admitted for seizure and aspiration pneumonitis.  Received Ativan and Keppra load EEG showed moderate to diffuse encephalopathy, no seizures or epileptiform  discharges. Continue Keppra 500 Mg twice daily. Seizure precautions.  No further seizures reported.  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 Prior left PCA infarct in August 2024. Carotid Dopplers at that time unremarkable. Had been discharged on Eliquis and Lipitor. MRI showed multiple acute and subacute infarcts.  Per neurology, likely secondary to antiphospholipid syndrome on Coumadin with subtherapeutic INR and noncompliance. Noncompliant to warfarin, now on warfarin with heparin IV bridge. Compliance has been counseled this admission. Continue Olanzapine prn for agitation Due to somnolence on 10/16, obtained CT head and EEG without acute abnormalities.  Mental status has improved and stable since. He has cognitive impairment and poor insight into his overall medical condition and physical impairment, recommending CIR-but per CIR, not a candidate for their department.  TOC working on SNF.  Acute hypoxic respiratory failure: Likely multifactorial. Resolved.   ESRD on MWF HD/hyperkalemia Nephrology following for HD needs Stable  Metabolic bone disease Significant hyperphosphatemia >30.  Nephrology resuming binder and Sensipar.  Phosphorus 11.9.   Essential hypertension: Controlled on as needed hydralazine.  Hyperlipidemia: Continue Lipitor  Anemia in ESRD: Stable.  Thrombocytopenia Appears new compared to August 2024.  Resolved.  Acute encephalopathy: Noted on 10/16. CT head without acute findings.  EEG suggestive of moderate diffuse encephalopathy.  No seizures or epileptiform discharges. Alert and oriented.  No agitation.  Cognition impaired.    Antiphospholipid Syndrome  SLE  Poor Med Adherence: Patient has tried multiple anticoagulants including warfarin, eliquis, xarelto, but struggles w/ med adherence.  Looks like he was most recently taking Warfarin, but INR on admission  suggests pt was not taking regularly. Although warfarin is first  line for antiphopsolipid syndrome, given pt has poor follow-up and poor med adherence, Eliquis may have been a better option.  However, as per neurology note, given patient's ESRD on dialysis and antiphospholipid syndrome, Coumadin may be the best option and he may not have much choices. Completed IV heparin bridge.  Goal INR 2.5-3.5.  INR 3 on 10/22.  Off heparin bridge.  Body mass index is 30.05 kg/m.     DVT prophylaxis: SCDs Start: 03/27/23 1703     Code Status: Full Code:  Family Communication: None at bedside. Disposition:  From a medical standpoint, he is stable for DC to next level of care.  Extremely unsafe to return home immediately due to cognitive impairment, poor insight, lives alone, may not be able to manage his medications, HD needs.  Hopefully can go to SNF with improvement in all of these prior to returning home.  Declined by CIR.  Awaiting SNF.  Discussed with TOC 10/22.     Consultants:   Nephrology Neurology  Procedures:   Hemodialysis  Antimicrobials:      Subjective:  No complaints reported.  Objective:   Vitals:   04/08/23 2318 04/09/23 0338 04/09/23 0738 04/09/23 1102  BP: 117/74 111/75 114/79 117/72  Pulse: 86 79 81 86  Resp: 16 18 18 17   Temp: 98.3 F (36.8 C) 98.6 F (37 C) 97.8 F (36.6 C) 98 F (36.7 C)  TempSrc: Oral  Oral Oral  SpO2: 95% 96% 99% 98%  Weight:      Height:        General exam: Young male, moderately built and nourished lying comfortably propped up in bed without distress. Respiratory system: Clear to auscultation.  No increased work of breathing. Cardiovascular system: S1 & S2 heard, RRR. No JVD, murmurs, rubs, gallops or clicks. No pedal edema.  Off of telemetry now. Gastrointestinal system: Abdomen is nondistended, soft and nontender. No organomegaly or masses felt. Normal bowel sounds heard. Central nervous system: Alert and oriented x 2.  No focal neurological deficits. Extremities: Symmetric 5 x 5 power. Skin:  No rashes, lesions or ulcers Psychiatry: Judgement and insight impaired. Mood & affect flat.     Data Reviewed:   I have personally reviewed following labs and imaging studies   CBC: Recent Labs  Lab 04/04/23 1134 04/05/23 0822 04/07/23 0751 04/08/23 0749 04/09/23 0442  WBC 9.3   < > 8.7 7.6 9.0  NEUTROABS 6.7  --   --   --   --   HGB 11.5*   < > 12.2* 11.9* 10.7*  HCT 33.4*   < > 37.2* 36.6* 32.8*  MCV 88.4   < > 92.3 92.0 91.6  PLT 109*   < > 161 174 209   < > = values in this interval not displayed.    Basic Metabolic Panel: Recent Labs  Lab 04/03/23 0601 04/04/23 1134 04/05/23 0822 04/08/23 1159  NA 133* 136 132* 132*  K 5.1 6.7* 5.1 5.2*  CL 88* 94* 92* 91*  CO2 23 17* 22 23  GLUCOSE 86 101* 83 94  BUN 81* 107* 56* 82*  CREATININE 17.63* 21.42* 14.24* 16.46*  CALCIUM 10.0 8.6* 9.7 9.3  MG 2.5*  --   --   --   PHOS 10.9* 11.9*  --  9.1*    Liver Function Tests: Recent Labs  Lab 04/04/23 1134 04/08/23 1159  ALBUMIN 3.4* 3.4*    CBG: Recent Labs  Lab 04/08/23 2358 04/09/23 0336 04/09/23 0738  GLUCAP 99 80 77    Microbiology Studies:  No results found for this or any previous visit (from the past 240 hour(s)).  Radiology Studies:  No results found.  Scheduled Meds:    atorvastatin  40 mg Oral Daily   Chlorhexidine Gluconate Cloth  6 each Topical Q0600   cinacalcet  60 mg Oral Q supper   levETIRAcetam  500 mg Oral BID   sevelamer carbonate  3,200 mg Oral TID WC   warfarin  7.5 mg Oral ONCE-1600   Warfarin - Pharmacist Dosing Inpatient   Does not apply q1600    Continuous Infusions:       LOS: 13 days     Marcellus Scott, MD,  FACP, [name], [facility], [facility]   [facility]ician Advisor [place]      To contact the attending provider between 7A-7P or the covering provider during after hours 7P-7A, please log into the web site [email or web address] and access using universal [place] password for that web site. If  you do not have the password, please call the hospital operator.  04/09/2023, 1:59 PM

## 2023-04-09 NOTE — TOC Progression Note (Signed)
Transition of Care [facility]) - Progression Note    Patient Details  Name: [name] [name] MRN: [id number] Date of Birth: 1980/12/07  Transition of Care [facility]) CM/SW Contact  Baldemar Lenis, [place] Phone Number: 04/09/2023, 3:01 PM  Clinical Narrative:   CSW met with patient to provide bed offers for SNF. Patient in agreement, but expressed some frustration that he can't get better in the hospital if they won't even let him get out of bed to do anything. Patient asked for time to review options, asked CSW to come back tomorrow morning for SNF choice. CSW to follow.    Expected Discharge Plan: Skilled Nursing Facility    Expected Discharge Plan and Services                                               Social Determinants of Health (SDOH) Interventions SDOH Screenings   Food Insecurity: No Food Insecurity (04/01/2023)  Housing: Low Risk  (04/01/2023)  Transportation Needs: No Transportation Needs (04/01/2023)  Utilities: Not At Risk (04/01/2023)  Alcohol Screen: Low Risk  (03/26/2023)  Depression (PHQ2-9): Low Risk  (03/26/2023)  Financial Resource Strain: Low Risk  (03/26/2023)  Physical Activity: Inactive (03/26/2023)  Social Connections: Moderately Isolated (03/26/2023)  Stress: No Stress Concern Present (03/26/2023)  Tobacco Use: Unknown (04/01/2023)  Health Literacy: Adequate Health Literacy (03/26/2023)    Readmission Risk Interventions    03/28/2023    2:57 PM 01/25/2023    3:40 PM  Readmission Risk Prevention Plan  Post Dischage Appt  Complete  Medication Screening  Complete  Transportation Screening Complete Complete  Medication Review (RN Care Manager) Referral to Pharmacy   PCP or Specialist appointment within 3-5 days of discharge Complete   HRI or Home Care Consult Complete   SW Recovery Care/Counseling Consult Complete   Palliative Care Screening Not Applicable   Skilled Nursing Facility Not Applicable

## 2023-04-09 NOTE — Progress Notes (Signed)
[facility] NEPHROLOGY PROGRESS NOTE  Assessment/ Plan: Seizures - acute on chronic, takes Keppra at home but misses doses. MRI with scattered infarcts - neurology implicates subtherapeutic INR.   H/o antiphospholipid syndrome - hx SLE, has been tried on multiple anticoagulants in the past but struggles w/ medication adherence.  On coumadin   ESRD - on HD MWF.  Next dialysis tomorrow if still here  HTN/volume - UF with HD as tolerated, continue fluid restriction.  Anemia of ESRD - Hb 10.7, no esa needs.   CKD-MBD - CCa normalized, and phos is very high. Resumed binder and sensipar. Renal diet. Po4 down to 9.1  OP HD: [facility] MWF  4h   400/800   101.5kg   2/2 bath   RIJ TDC    Heparin none - last OP HD today 10/9, post wt 110.5kg (9kg over) - mircera 50 mcg IV q 2wks, last 10/2, due 10/16  - venofer 100mg  three times per week thru today - rocaltrol 2.0 mcg   [name], MD  [facility]   Subjective: Seen and examined at the bedside. No complaints, no acute events overnight.  He reports that he tolerated dialysis yesterday with a net UF of 0.9 L.  He did have issues with intermittent hypotension during dialysis, he reports that this is a chronic issue.  Objective Vital signs in last 24 hours: Vitals:   04/08/23 2027 04/08/23 2318 04/09/23 0338 04/09/23 0738  BP: (!) 92/55 117/74 111/75 114/79  Pulse: 99 86 79 81  Resp: 16 16 18 18   Temp: 98.8 F (37.1 C) 98.3 F (36.8 C) 98.6 F (37 C) 97.8 F (36.6 C)  TempSrc: Oral Oral  Oral  SpO2: 96% 95% 96% 99%  Weight:      Height:       Weight change:   Intake/Output Summary (Last 24 hours) at 04/09/2023 0911 Last data filed at 04/08/2023 1530 Gross per 24 hour  Intake --  Output 900 ml  Net -900 ml       Labs: RENAL PANEL Recent Labs  Lab 04/03/23 0601 04/04/23 1134 04/05/23 0822 04/08/23 1159  NA 133* 136 132* 132*  K 5.1 6.7* 5.1 5.2*  CL 88* 94* 92* 91*  CO2 23 17* 22 23   GLUCOSE 86 101* 83 94  BUN 81* 107* 56* 82*  CREATININE 17.63* 21.42* 14.24* 16.46*  CALCIUM 10.0 8.6* 9.7 9.3  MG 2.5*  --   --   --   PHOS 10.9* 11.9*  --  9.1*  ALBUMIN  --  3.4*  --  3.4*    Liver Function Tests: Recent Labs  Lab 04/04/23 1134 04/08/23 1159  ALBUMIN 3.4* 3.4*   No results for input(s): "LIPASE", "AMYLASE" in the last 168 hours. No results for input(s): "AMMONIA" in the last 168 hours. CBC: Recent Labs    04/05/23 0822 04/06/23 0537 04/07/23 0751 04/08/23 0749 04/09/23 0442  HGB 11.8* 11.8* 12.2* 11.9* 10.7*  MCV 90.5 90.7 92.3 92.0 91.6    Cardiac Enzymes: No results for input(s): "CKTOTAL", "CKMB", "CKMBINDEX", "TROPONINI" in the last 168 hours. CBG: Recent Labs  Lab 04/08/23 1725 04/08/23 1936 04/08/23 2358 04/09/23 0336 04/09/23 0738  GLUCAP 157* 166* 99 80 77    Iron Studies: No results for input(s): "IRON", "TIBC", "TRANSFERRIN", "FERRITIN" in the last 72 hours. Studies/Results: No results found.  Medications: Infusions:    Scheduled Medications:  atorvastatin  40 mg Oral Daily   Chlorhexidine Gluconate Cloth  6 each  Topical Q0600   cinacalcet  60 mg Oral Q supper   levETIRAcetam  500 mg Oral BID   sevelamer carbonate  3,200 mg Oral TID WC   warfarin  7.5 mg Oral ONCE-1600   Warfarin - Pharmacist Dosing Inpatient   Does not apply q1600    have reviewed scheduled and prn medications.  Physical Exam: General:NAD, comfortable, laying flat in bed Heart:RRR, s1s2 Lungs: cta bl Abdomen:soft, Non-tender, non-distended Extremities:No edema Dialysis Access: Right IJ TDC, nonfunctional lue avf  Adren Dollins 04/09/2023,9:11 AM  LOS: 13 days

## 2023-04-09 NOTE — Progress Notes (Signed)
PHARMACY - ANTICOAGULATION CONSULT NOTE  Pharmacy Consult for warfarin Indication:  APLA syndrome  Allergies  Allergen Reactions   Zyrtec [Cetirizine] Swelling    FACIAL SWELLING, NOT CATEGORIZED   Cetirizine & Related Swelling    Patient Measurements: Height: 6\' 1"  (185.4 cm) Weight: 103.3 kg (227 lb 11.8 oz) IBW/kg (Calculated) : 79.9 Heparin Dosing Weight: 103 kg   Vital Signs: Temp: 98.6 F (37 C) (10/22 0338) Temp Source: Oral (10/21 2318) BP: 111/75 (10/22 0338) Pulse Rate: 79 (10/22 0338)  Labs: Recent Labs    04/07/23 0751 04/07/23 1040 04/07/23 1812 04/08/23 0749 04/08/23 1159 04/09/23 0442  HGB 12.2*  --   --  11.9*  --  10.7*  HCT 37.2*  --   --  36.6*  --  32.8*  PLT 161  --   --  174  --  209  LABPROT 22.0*  --   --  27.2*  --  31.6*  INR 1.9*  --   --  2.5*  --  3.0*  HEPARINUNFRC >1.10*   < > <0.10* 0.23*  --  0.27*  CREATININE  --   --   --   --  16.46*  --    < > = values in this interval not displayed.   Estimated Creatinine Clearance: 7.3 mL/min (A) (by C-G formula based on SCr of 16.46 mg/dL (H)).  Medical History: Past Medical History:  Diagnosis Date   ESRD on hemodialysis (HCC)    Hypertension    Lupus    Seizures (HCC)    Seizures (HCC)    Stroke (HCC)    42 or 42 years of age.     Wears glasses    Assessment: 42 yo M presented with seizures with HD. Patient has history antiphospholipid syndrome, leading to hypercoagulable state and causing hx of strokes. INR on admission 1.1 - most likely not taking Warfarin at home. Warfarin held and sq heparin initiated in interim. MRI showing multiple acute and subacute infarcts. Per MD, discussed with neurology and would like to start warfarin and use heparin bridge until therapeutic.  INR therapeutic, heparin infusion discontinued. INR trending up, will decrease warfarin dose to prevent supratherapeutic INR.   Goal of Therapy:  Heparin level 0.3-0.5 units/ml INR 2.5-3.5 per neurology   Monitor platelets by anticoagulation protocol: Yes   Plan:  Give warfarin 7.5 mg PO x1 dose Check INR daily while on warfarin Continue to monitor H&H and platelets  Thank you for allowing pharmacy to be a part of this patient's care.  Thelma Barge, PharmD Clinical Pharmacist

## 2023-04-09 NOTE — Evaluation (Signed)
Clinical/Bedside Swallow Evaluation Patient Details  Name: [name] [name] MRN: [id number] Date of Birth: [birth year]  Today's Date: 04/09/2023 Time: SLP Start Time (ACUTE ONLY): 1539 SLP Stop Time (ACUTE ONLY): 1547 SLP Time Calculation (min) (ACUTE ONLY): 8 min  Past Medical History:  Past Medical History:  Diagnosis Date   ESRD on hemodialysis (HCC)    Hypertension    Lupus    Seizures (HCC)    Seizures (HCC)    Stroke (HCC)    8 or 42 years of age.     Wears glasses    Past Surgical History:  Past Surgical History:  Procedure Laterality Date   AV FISTULA PLACEMENT Left 12/30/2017   Procedure: ARTERIOVENOUS (AV) FISTULA CREATION VERSUS INSERTION OF ARTERIOVENOUS GRAFT LEFT ARM;  Surgeon: Larina Earthly, MD;  Location: [facility] OR;  Service: Vascular;  Laterality: Left;   BASCILIC VEIN TRANSPOSITION Left 08/15/2018   Procedure: LEFT FIRST STAGE BASCILIC VEIN TRANSPOSITION;  Surgeon: Larina Earthly, MD;  Location: [facility] OR;  Service: Vascular;  Laterality: Left;   BASCILIC VEIN TRANSPOSITION Left 10/30/2018   Procedure: INSERTION OF GORE-TEX GRAFT LEFT UPPER ARM;  Surgeon: Maeola Harman, MD;  Location: [facility] OR;  Service: Vascular;  Laterality: Left;   INSERTION OF DIALYSIS CATHETER N/A 09/20/2018   Procedure: INSERTION OF TUNNELED DIALYSIS CATHETER RIGHT INTERNAL JUGULAR;  Surgeon: Cephus Shelling, MD;  Location: [facility] OR;  Service: Vascular;  Laterality: N/A;   IR FLUORO GUIDE CV LINE RIGHT  07/16/2022   IR US GUIDE VASC ACCESS RIGHT  07/16/2022   RENAL BIOPSY     HPI:  Pt is a 42 yo male presenting 10/9 from dialysis with AMS.  Patient was found to have active seizures in the ER with tongue biting and was hypoxic requiring NRB. MRI 10/12 showed scattered foci of acute and subacute infarction throughout both cerebral hemispheres, including the bilateral ACA, MCA and PCA territories. PMH includes: CVA (L PCA, August 2024; SLP eval at that time with severe cognitive impairment with  concern for acute on chronic decline), HTN, ESRD on HD, SLE with APLA, chronic thrombocytopenia, anemia of chronic disease, history of seizures, history of medication noncompliance    Assessment / Plan / Recommendation  Clinical Impression  Pt's oropharyngeal swallow appears to be [facility]. He has previously passed a Ninfa Linden, has been on current diet since 10/13, and RN today voices no concerns about swallowing. Recommend continuing with regular solids and thin liquids with no SLP f/u indicated for dysphagia. Pt in agreement.  SLP Visit Diagnosis: Dysphagia, unspecified (R13.10)    Aspiration Risk  No limitations    Diet Recommendation Regular;Thin liquid    Liquid Administration via: Cup;Straw Medication Administration: Whole meds with liquid Supervision: Patient able to self feed Compensations: Slow rate;Small sips/bites Postural Changes: Seated upright at 90 degrees    Other  Recommendations Oral Care Recommendations: Oral care BID    Recommendations for follow up therapy are one component of a multi-disciplinary discharge planning process, led by the attending physician.  Recommendations may be updated based on patient status, additional functional criteria and insurance authorization.  Follow up Recommendations Skilled nursing-short term rehab (<3 hours/day) (for cognitive-linguistic function)      Assistance Recommended at Discharge    Functional Status Assessment Patient has not had a recent decline in their functional status  Frequency and Duration            Prognosis        Swallow Study   General  HPI: Pt is a 42 yo male presenting 10/9 from dialysis with AMS.  Patient was found to have active seizures in the ER with tongue biting and was hypoxic requiring NRB. MRI 10/12 showed scattered foci of acute and subacute infarction throughout both cerebral hemispheres, including the bilateral ACA, MCA and PCA territories. PMH includes: CVA (L PCA, August 2024; SLP eval at that time  with severe cognitive impairment with concern for acute on chronic decline), HTN, ESRD on HD, SLE with APLA, chronic thrombocytopenia, anemia of chronic disease, history of seizures, history of medication noncompliance Type of Study: Bedside Swallow Evaluation Previous Swallow Assessment: none in chart Diet Prior to this Study: Regular;Thin liquids (Level 0) Temperature Spikes Noted: No Respiratory Status: Room air History of Recent Intubation: No Behavior/Cognition: Alert;Cooperative Oral Cavity Assessment: Within Functional Limits Oral Care Completed by SLP: No Oral Cavity - Dentition: Adequate natural dentition Vision: Functional for self-feeding Self-Feeding Abilities: Able to feed self Patient Positioning: Upright in bed Baseline Vocal Quality: Normal Volitional Cough: Weak (question effort) Volitional Swallow: Able to elicit    Oral/Motor/Sensory Function Overall Oral Motor/Sensory Function: Within functional limits   Ice Chips Ice chips: Not tested   Thin Liquid Thin Liquid: Within functional limits Presentation: Self Fed;Straw    Nectar Thick Nectar Thick Liquid: Not tested   Honey Thick Honey Thick Liquid: Not tested   Puree Puree: Within functional limits Presentation: Self Fed;Spoon   Solid     Solid: Within functional limits Presentation: Self Fed      Mahala Menghini., M.A. CCC-SLP Acute Rehabilitation Services Office [phone]  Secure chat preferred  04/09/2023,4:09 PM
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
## 2023-04-09 NOTE — Plan of Care (Signed)
  Problem: Ischemic Stroke/TIA Tissue Perfusion: Goal: Complications of ischemic stroke/TIA will be minimized Outcome: Progressing   Problem: Coping: Goal: Will verbalize positive feelings about self Outcome: Progressing Goal: Will identify appropriate support needs Outcome: Progressing   Problem: Self-Care: Goal: Ability to participate in self-care as condition permits will improve Outcome: Progressing Goal: Verbalization of feelings and concerns over difficulty with self-care will improve Outcome: Progressing Goal: Ability to communicate needs accurately will improve Outcome: Progressing   Problem: Nutrition: Goal: Risk of aspiration will decrease Outcome: Progressing Goal: Dietary intake will improve Outcome: Progressing   Problem: Clinical Measurements: Goal: Ability to maintain clinical measurements within normal limits will improve Outcome: Progressing Goal: Will remain free from infection Outcome: Progressing Goal: Diagnostic test results will improve Outcome: Progressing Goal: Respiratory complications will improve Outcome: Progressing Goal: Cardiovascular complication will be avoided Outcome: Progressing   Problem: Activity: Goal: Risk for activity intolerance will decrease Outcome: Progressing   Problem: Nutrition: Goal: Adequate nutrition will be maintained Outcome: Progressing   Problem: Coping: Goal: Level of anxiety will decrease Outcome: Progressing   Problem: Elimination: Goal: Will not experience complications related to bowel motility Outcome: Progressing Goal: Will not experience complications related to urinary retention Outcome: Progressing   Problem: Pain Managment: Goal: General experience of comfort will improve Outcome: Progressing   Problem: Safety: Goal: Ability to remain free from injury will improve Outcome: Progressing   Problem: Skin Integrity: Goal: Risk for impaired skin integrity will decrease Outcome: Progressing

## 2023-04-09 NOTE — Evaluation (Signed)
Speech Language Pathology Evaluation Patient Details Name: [name] [name] MRN: [id number] DOB: Oct 11, 1980 Today's Date: 04/09/2023 Time: 4098-1191 SLP Time Calculation (min) (ACUTE ONLY): 8 min  Problem List:  Patient Active Problem List   Diagnosis Date Noted   Cerebrovascular accident (CVA) due to embolism of cerebral artery (HCC) 04/04/2023   Seizure (HCC) 01/19/2023   Acute respiratory failure with hypoxia (HCC) 01/19/2023   Status epilepticus (HCC) 08/10/2022   ESRD on hemodialysis (HCC) 02/15/2022   Other disorders of phosphorus metabolism 08/18/2021   Fluid overload, unspecified 05/10/2021   Allergy, unspecified, initial encounter 04/19/2021   Anaphylactic shock, unspecified, sequela 04/19/2021   Pruritus, unspecified 04/19/2021   Hypocalcemia 04/01/2021   Long-term current use of anticonvulsant 08/04/2020   COVID-19 07/26/2020   Breakthrough seizure (HCC) 07/20/2020   Acute respiratory failure with hypoxia (HCC)    Lupus    Seizure, late effect of stroke (HCC) 08/26/2019   Secondary hyperparathyroidism of renal origin (HCC) 11/05/2018   Anemia in chronic kidney disease 09/23/2018   ESRD (end stage renal disease) (HCC) 03/18/2018   Prediabetes 01/10/2018   Chest pain in adult    CKD (chronic kidney disease) stage 3, GFR 30-59 ml/min (HCC) 10/24/2015   Seizures (HCC) 10/24/2015   HTN (hypertension) 01/12/2015   Chronic anticoagulation 04/24/2011   Lupus anticoagulant positive 04/24/2011   OBESITY, NOS 08/15/2006   THROMBOCYTOPENIA 08/15/2006   TOBACCO DEPENDENCE 08/15/2006   CVA 08/15/2006   Past Medical History:  Past Medical History:  Diagnosis Date   ESRD on hemodialysis (HCC)    Hypertension    Lupus    Seizures (HCC)    Seizures (HCC)    Stroke (HCC)    11 or 42 years of age.     Wears glasses    Past Surgical History:  Past Surgical History:  Procedure Laterality Date   AV FISTULA PLACEMENT Left 12/30/2017   Procedure: ARTERIOVENOUS (AV) FISTULA  CREATION VERSUS INSERTION OF ARTERIOVENOUS GRAFT LEFT ARM;  Surgeon: Larina Earthly, MD;  Location: [facility] OR;  Service: Vascular;  Laterality: Left;   BASCILIC VEIN TRANSPOSITION Left 08/15/2018   Procedure: LEFT FIRST STAGE BASCILIC VEIN TRANSPOSITION;  Surgeon: Larina Earthly, MD;  Location: [facility] OR;  Service: Vascular;  Laterality: Left;   BASCILIC VEIN TRANSPOSITION Left 10/30/2018   Procedure: INSERTION OF GORE-TEX GRAFT LEFT UPPER ARM;  Surgeon: Maeola Harman, MD;  Location: [facility] OR;  Service: Vascular;  Laterality: Left;   INSERTION OF DIALYSIS CATHETER N/A 09/20/2018   Procedure: INSERTION OF TUNNELED DIALYSIS CATHETER RIGHT INTERNAL JUGULAR;  Surgeon: Cephus Shelling, MD;  Location: [facility] OR;  Service: Vascular;  Laterality: N/A;   IR FLUORO GUIDE CV LINE RIGHT  07/16/2022   IR US GUIDE VASC ACCESS RIGHT  07/16/2022   RENAL BIOPSY     HPI:  Pt is a 42 yo male presenting 10/9 from dialysis with AMS.  Patient was found to have active seizures in the ER with tongue biting and was hypoxic requiring NRB. MRI 10/12 showed scattered foci of acute and subacute infarction throughout both cerebral hemispheres, including the bilateral ACA, MCA and PCA territories. PMH includes: CVA (L PCA, August 2024; SLP eval at that time with severe cognitive impairment with concern for acute on chronic decline), HTN, ESRD on HD, SLE with APLA, chronic thrombocytopenia, anemia of chronic disease, history of seizures, history of medication noncompliance   Assessment / Plan / Recommendation Clinical Impression  Pt participated in brief evaluation, wanting to nap and not  very engaged with SLP. He was oriented x3 (disoriented to time) and not able to identify any acute deficits. He does not think he needs any therapies. Question his reliablity as a historian, as severe cognitive deficits were noted in August during SLP evaluation and today pt says he never had any difficulty after that stroke. Pt recalled 2/4 words  during a delayed recall task and although he followed most commands, he needed some cues for sustained attention and encouragement to participate in tasks. Verbal expression was somewhat limited with abnormal affect and reduced eye contact observed. SLP will continue to follow, recommending additional f/u post-discharge as well.    SLP Assessment  SLP Recommendation/Assessment: Patient needs continued Speech Lanaguage Pathology Services SLP Visit Diagnosis: Cognitive communication deficit (R41.841)    Recommendations for follow up therapy are one component of a multi-disciplinary discharge planning process, led by the attending physician.  Recommendations may be updated based on patient status, additional functional criteria and insurance authorization.    Follow Up Recommendations  Skilled nursing-short term rehab (<3 hours/day)    Assistance Recommended at Discharge  Frequent or constant Supervision/Assistance  Functional Status Assessment Patient has had a recent decline in their functional status and demonstrates the ability to make significant improvements in function in a reasonable and predictable amount of time.  Frequency and Duration min 2x/week  2 weeks      SLP Evaluation Cognition  Overall Cognitive Status: No family/caregiver present to determine baseline cognitive functioning Arousal/Alertness: Awake/alert Orientation Level: Oriented to person;Oriented to place;Oriented to situation;Disoriented to time Attention: Sustained Sustained Attention: Impaired Sustained Attention Impairment: Verbal complex Memory: Impaired Memory Impairment: Decreased recall of new information;Retrieval deficit Awareness: Impaired Awareness Impairment: Intellectual impairment Safety/Judgment: Impaired       Comprehension  Auditory Comprehension Overall Auditory Comprehension: Appears within functional limits for tasks assessed (within simple, functional tasks)    Expression  Expression Primary Mode of Expression: Verbal Verbal Expression Overall Verbal Expression: Impaired Pragmatics: Impairment Impairments: Abnormal affect;Eye contact;Monotone Non-Verbal Means of Communication: Not applicable   Oral / Motor  Oral Motor/Sensory Function Overall Oral Motor/Sensory Function: Within functional limits Motor Speech Overall Motor Speech: Appears within functional limits for tasks assessed            Mahala Menghini., M.A. CCC-SLP Acute Rehabilitation Services Office [phone]  Secure chat preferred  04/09/2023, 4:17 PM

## 2023-04-10 DIAGNOSIS — R569 Unspecified convulsions: Secondary | ICD-10-CM | POA: Diagnosis not present

## 2023-04-10 LAB — CBC
HCT: 33.9 % — ABNORMAL LOW (ref 39.0–52.0)
Hemoglobin: 11 g/dL — ABNORMAL LOW (ref 13.0–17.0)
MCH: 30.1 pg (ref 26.0–34.0)
MCHC: 32.4 g/dL (ref 30.0–36.0)
MCV: 92.6 fL (ref 80.0–100.0)
Platelets: 200 10*3/uL (ref 150–400)
RBC: 3.66 MIL/uL — ABNORMAL LOW (ref 4.22–5.81)
RDW: 13.8 % (ref 11.5–15.5)
WBC: 7.1 10*3/uL (ref 4.0–10.5)
nRBC: 0 % (ref 0.0–0.2)

## 2023-04-10 LAB — PROTIME-INR
INR: 3.2 — ABNORMAL HIGH (ref 0.8–1.2)
Prothrombin Time: 33.3 s — ABNORMAL HIGH (ref 11.4–15.2)

## 2023-04-10 LAB — GLUCOSE, CAPILLARY
Glucose-Capillary: 104 mg/dL — ABNORMAL HIGH (ref 70–99)
Glucose-Capillary: 122 mg/dL — ABNORMAL HIGH (ref 70–99)
Glucose-Capillary: 123 mg/dL — ABNORMAL HIGH (ref 70–99)
Glucose-Capillary: 73 mg/dL (ref 70–99)
Glucose-Capillary: 80 mg/dL (ref 70–99)
Glucose-Capillary: 89 mg/dL (ref 70–99)

## 2023-04-10 MED ORDER — WARFARIN SODIUM 7.5 MG PO TABS
7.5000 mg | ORAL_TABLET | Freq: Once | ORAL | Status: AC
  Administered 2023-04-10: 7.5 mg via ORAL
  Filled 2023-04-10: qty 1

## 2023-04-10 NOTE — Plan of Care (Signed)
  Problem: Education: Goal: Knowledge of disease or condition will improve 04/10/2023 1800 by Avie Arenas, RN Outcome: Progressing 04/10/2023 1743 by Avie Arenas, RN Outcome: Progressing   Problem: Coping: Goal: Will verbalize positive feelings about self 04/10/2023 1800 by Avie Arenas, RN Outcome: Progressing 04/10/2023 1743 by Avie Arenas, RN Outcome: Progressing Goal: Will identify appropriate support needs 04/10/2023 1800 by Avie Arenas, RN Outcome: Progressing 04/10/2023 1743 by Avie Arenas, RN Outcome: Progressing   Problem: Nutrition: Goal: Risk of aspiration will decrease 04/10/2023 1800 by Avie Arenas, RN Outcome: Progressing 04/10/2023 1743 by Avie Arenas, RN Outcome: Progressing
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
## 2023-04-10 NOTE — Progress Notes (Signed)
Pt receives out-pt HD at [facility] on MWF with 7:50 am chair time. Will assist as needed.   Olivia Canter Renal Navigator [phone]

## 2023-04-10 NOTE — Plan of Care (Signed)
Problem: Ischemic Stroke/TIA Tissue Perfusion: Goal: Complications of ischemic stroke/TIA will be minimized Outcome: Progressing   Problem: Coping: Goal: Will verbalize positive feelings about self Outcome: Progressing Goal: Will identify appropriate support needs Outcome: Progressing   Problem: Self-Care: Goal: Ability to participate in self-care as condition permits will improve Outcome: Progressing Goal: Verbalization of feelings and concerns over difficulty with self-care will improve Outcome: Progressing Goal: Ability to communicate needs accurately will improve Outcome: Progressing   Problem: Nutrition: Goal: Risk of aspiration will decrease Outcome: Progressing Goal: Dietary intake will improve Outcome: Progressing   Problem: Clinical Measurements: Goal: Ability to maintain clinical measurements within normal limits will improve Outcome: Progressing Goal: Will remain free from infection Outcome: Progressing Goal: Diagnostic test results will improve Outcome: Progressing Goal: Respiratory complications will improve Outcome: Progressing Goal: Cardiovascular complication will be avoided Outcome: Progressing   Problem: Activity: Goal: Risk for activity intolerance will decrease Outcome: Progressing   Problem: Nutrition: Goal: Adequate nutrition will be maintained Outcome: Progressing   Problem: Coping: Goal: Level of anxiety will decrease Outcome: Progressing   Problem: Elimination: Goal: Will not experience complications related to bowel motility Outcome: Progressing Goal: Will not experience complications related to urinary retention Outcome: Progressing   Problem: Pain Managment: Goal: General experience of comfort will improve Outcome: Progressing   Problem: Safety: Goal: Ability to remain free from injury will improve Outcome: Progressing   Problem: Skin Integrity: Goal: Risk for impaired skin integrity will decrease Outcome: Progressing

## 2023-04-10 NOTE — Progress Notes (Signed)
PROGRESS NOTE    [name] [name]  UXL:[id number] DOB: 21-Jun-1980 DOA: 03/27/2023 PCP: Claiborne Rigg, NP   Brief Narrative:  This 42 years old male with PMH significant for essential hypertension, ESRD on hemodialysis, SLE with APLA previously on Eliquis, CVA, chronic thrombocytopenia, anemia of chronic disease, history of seizures, history of medication noncompliance presented to the ED from dialysis for not acting himself during dialysis.  Patient was found to have active seizures in the ER with tongue biting and was hypoxic with SpO2 of 60% on room air which improved to 100% on nonrebreather and was treated with IV Ativan.  Patient remained postictal in the ER occasionally agitated.  He was loaded with Keppra.  Neurology was consulted.  CT head without acute bleed but possibly shows new stroke.  Patient had multiple ER visits for breakthrough seizures.  MRI showed acute infarct of the left PCA territory, no hemorrhage or mass effect.  Patient was admitted in the ICU. TRH pickup 03/29/2023   Medically optimized and NR therapeutic at 3 on 10/22.  CIR declined.  TOC working up for SNF, has cognitive/insight issues and unsafe to discharge home by self.  DC pending bed availability.   Assessment & Plan:   Principal Problem:   Seizure [facility]) Active Problems:   Cerebrovascular accident (CVA) due to embolism of cerebral artery (HCC)  Seizure with postictal encephalopathy/history of seizures: History of complex partial seizures. Multiple ED visits for breakthrough seizures.  Questionable compliance-reportedly misses about 3-4 Doses of Keppra per week. Admitted for seizure and aspiration pneumonitis.  Received Ativan and Keppra load EEG showed moderate to diffuse encephalopathy, No seizures or epileptiform discharges. Continue Keppra 500 Mg twice daily. Seizure precautions.  No further seizures reported.   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 Prior left PCA infarct  in August 2024. Carotid Dopplers at that time unremarkable. Had been discharged on Eliquis and Lipitor. MRI showed multiple acute and subacute infarcts.  Per neurology, likely secondary to antiphospholipid syndrome on Coumadin with subtherapeutic INR and noncompliance. Noncompliant to warfarin, now on warfarin with heparin IV bridge. Compliance has been counseled this admission. Continue Olanzapine prn for agitation Due to somnolence on 10/16, obtained CT head and EEG without acute abnormalities.  Mental status has improved and stable since. He has cognitive impairment and poor insight into his overall medical condition and physical impairment, recommending CIR-but per CIR, not a candidate for their department.  TOC working on SNF.   Acute hypoxic respiratory failure: Likely multifactorial. Resolved.   ESRD on MWF HD/hyperkalemia Nephrology following for HD needs. Stable. HD today.   Metabolic bone disease Significant hyperphosphatemia >30.   Nephrology resuming binder and Sensipar.  Phosphorus 11.9.   Essential hypertension: Controlled on as needed hydralazine.   Hyperlipidemia: Continue Lipitor   Anemia in ESRD: Stable.   Thrombocytopenia Appears new compared to August 2024.  Resolved.   Acute encephalopathy: > Resolved. Noted on 10/16. CT head without acute findings.  EEG suggestive of moderate diffuse encephalopathy.  No seizures or epileptiform discharges. Alert and oriented.  No agitation.  Cognition impaired.    Antiphospholipid Syndrome  SLE  Poor Med Adherence: Patient has tried multiple anticoagulants including warfarin, eliquis, xarelto, but struggles w/ med adherence.  Looks like he was most recently taking Warfarin, but INR on admission suggests pt was not taking regularly. Although warfarin is first line for antiphopsolipid syndrome, given pt has poor follow-up and poor med adherence, Eliquis may have been a better option.  However, as per neurology note,  given patient's ESRD on dialysis and antiphospholipid syndrome, Coumadin may be the best option and he may not have much choices. Completed IV heparin bridge.  Goal INR 2.5-3.5.  INR 3 on 10/22.  Off heparin bridge.   Body mass index is 30.05 kg/m.     DVT prophylaxis:  Coumadin Code Status: Full code Family Communication:No family at bed side Disposition Plan:    Status is: Inpatient Remains inpatient appropriate because:   From a medical standpoint, he is stable for DC to next level of care.  Extremely unsafe to return home immediately due to cognitive impairment, poor insight, lives alone, may not be able to manage his medications, HD needs.  Hopefully can go to SNF with improvement in all of these prior to returning home.  Declined by CIR.  Awaiting SNF.  Discussed with TOC 10/22.      Consultants:  Nephrology Neurology  Procedures:  Antimicrobials:  Anti-infectives (From admission, onward)    Start     Dose/Rate Route Frequency Ordered Stop   03/27/23 1545  cefTRIAXone (ROCEPHIN) 1 g in sodium chloride 0.9 % 100 mL IVPB        1 g 200 mL/hr over 30 Minutes Intravenous  Once 03/27/23 1544 03/27/23 1740   03/27/23 1545  azithromycin (ZITHROMAX) 500 mg in sodium chloride 0.9 % 250 mL IVPB  Status:  Discontinued        500 mg 250 mL/hr over 60 Minutes Intravenous  Once 03/27/23 1544 03/27/23 1727      Subjective: Patient was seen and examined at bedside.  Overnight events noted.   Patient reports having dialysis today. Patient denies any concerns.  Objective: Vitals:   04/09/23 1541 04/09/23 1928 04/09/23 2317 04/10/23 0338  BP: 111/78 113/74 126/83 124/86  Pulse: 79 69 66 63  Resp: 17 18 18 18   Temp: 97.9 F (36.6 C) 98.3 F (36.8 C) 97.8 F (36.6 C) 97.6 F (36.4 C)  TempSrc: Oral Oral Oral Oral  SpO2: 97% 98% 98% 97%  Weight:      Height:       No intake or output data in the 24 hours ending 04/10/23 1230 Filed Weights   04/06/23 1315 04/08/23 1200  04/08/23 1559  Weight: 102.5 kg 105.1 kg 103.3 kg    Examination:  General exam: Appears calm and comfortable, not in any acute distress.  Deconditioned Respiratory system: Clear to auscultation. Respiratory effort normal.  RR 16 Cardiovascular system: S1 & S2 heard, RRR. No JVD, murmurs, rubs, gallops or clicks. No pedal edema. Gastrointestinal system: Abdomen is non distended, soft and non tender.  Normal bowel sounds heard. Central nervous system: Alert and oriented x 3. No focal neurological deficits. Extremities: No edema, no cyanosis, no clubbing Skin: No rashes, lesions or ulcers Psychiatry: Judgement and insight appear normal. Mood & affect appropriate.     Data Reviewed: I have personally reviewed following labs and imaging studies  CBC: Recent Labs  Lab 04/04/23 1134 04/05/23 0822 04/06/23 0537 04/07/23 0751 04/08/23 0749 04/09/23 0442 04/10/23 0446  WBC 9.3   < > 7.1 8.7 7.6 9.0 7.1  NEUTROABS 6.7  --   --   --   --   --   --   HGB 11.5*   < > 11.8* 12.2* 11.9* 10.7* 11.0*  HCT 33.4*   < > 36.0* 37.2* 36.6* 32.8* 33.9*  MCV 88.4   < > 90.7 92.3 92.0 91.6 92.6  PLT 109*   < >  111* 161 174 209 200   < > = values in this interval not displayed.   Basic Metabolic Panel: Recent Labs  Lab 04/04/23 1134 04/05/23 0822 04/08/23 1159  NA 136 132* 132*  K 6.7* 5.1 5.2*  CL 94* 92* 91*  CO2 17* 22 23  GLUCOSE 101* 83 94  BUN 107* 56* 82*  CREATININE 21.42* 14.24* 16.46*  CALCIUM 8.6* 9.7 9.3  PHOS 11.9*  --  9.1*   GFR: Estimated Creatinine Clearance: 7.3 mL/min (A) (by C-G formula based on SCr of 16.46 mg/dL (H)). Liver Function Tests: Recent Labs  Lab 04/04/23 1134 04/08/23 1159  ALBUMIN 3.4* 3.4*   No results for input(s): "LIPASE", "AMYLASE" in the last 168 hours. No results for input(s): "AMMONIA" in the last 168 hours. Coagulation Profile: Recent Labs  Lab 04/06/23 0537 04/07/23 0751 04/08/23 0749 04/09/23 0442 04/10/23 0446  INR 1.7* 1.9*  2.5* 3.0* 3.2*   Cardiac Enzymes: No results for input(s): "CKTOTAL", "CKMB", "CKMBINDEX", "TROPONINI" in the last 168 hours. BNP (last 3 results) No results for input(s): "PROBNP" in the last 8760 hours. HbA1C: No results for input(s): "HGBA1C" in the last 72 hours. CBG: Recent Labs  Lab 04/09/23 1934 04/09/23 2316 04/10/23 0343 04/10/23 0811 04/10/23 1149  GLUCAP 79 77 73 123* 89   Lipid Profile: No results for input(s): "CHOL", "HDL", "LDLCALC", "TRIG", "CHOLHDL", "LDLDIRECT" in the last 72 hours. Thyroid Function Tests: No results for input(s): "TSH", "T4TOTAL", "FREET4", "T3FREE", "THYROIDAB" in the last 72 hours. Anemia Panel: No results for input(s): "VITAMINB12", "FOLATE", "FERRITIN", "TIBC", "IRON", "RETICCTPCT" in the last 72 hours. Sepsis Labs: No results for input(s): "PROCALCITON", "LATICACIDVEN" in the last 168 hours.  No results found for this or any previous visit (from the past 240 hour(s)).   Radiology Studies: No results found.  Scheduled Meds:  atorvastatin  40 mg Oral Daily   Chlorhexidine Gluconate Cloth  6 each Topical Q0600   cinacalcet  60 mg Oral Q supper   levETIRAcetam  500 mg Oral BID   sevelamer carbonate  3,200 mg Oral TID WC   warfarin  7.5 mg Oral ONCE-1600   Warfarin - Pharmacist Dosing Inpatient   Does not apply q1600   Continuous Infusions:   LOS: 14 days    Time spent: 35 mins    Willeen Niece, MD [facility]   If 7PM-7AM, please contact night-coverage

## 2023-04-10 NOTE — Plan of Care (Signed)
  Problem: Education: Goal: Knowledge of disease or condition will improve Outcome: Progressing   Problem: Coping: Goal: Will verbalize positive feelings about self Outcome: Progressing Goal: Will identify appropriate support needs Outcome: Progressing   Problem: Nutrition: Goal: Risk of aspiration will decrease Outcome: Progressing

## 2023-04-10 NOTE — Progress Notes (Signed)
[facility] NEPHROLOGY PROGRESS NOTE  Assessment/ Plan: Seizures - acute on chronic, takes Keppra at home but misses doses. MRI with scattered infarcts - neurology implicates subtherapeutic INR.   H/o antiphospholipid syndrome - hx SLE, has been tried on multiple anticoagulants in the past but struggles w/ medication adherence.  On coumadin   ESRD - on HD MWF.  Next dialysis today  HTN/volume - UF with HD as tolerated, continue fluid restriction.  Anemia of ESRD - Hb 11, no esa needs.   CKD-MBD - CCa normalized, and phos is very high. Resumed binder and sensipar. Renal diet. Po4 down to 9.1  OP HD: [facility] MWF  4h   400/800   101.5kg   2/2 bath   RIJ TDC    Heparin none - last OP HD today 10/9, post wt 110.5kg (9kg over) - mircera 50 mcg IV q 2wks, last 10/2, due 10/16  - venofer 100mg  three times per week thru today - rocaltrol 2.0 mcg   [name], MD [facility]   Subjective: Seen and examined at the bedside. No complaints, no acute events overnight.   Objective Vital signs in last 24 hours: Vitals:   04/09/23 1541 04/09/23 1928 04/09/23 2317 04/10/23 0338  BP: 111/78 113/74 126/83 124/86  Pulse: 79 69 66 63  Resp: 17 18 18 18   Temp: 97.9 F (36.6 C) 98.3 F (36.8 C) 97.8 F (36.6 C) 97.6 F (36.4 C)  TempSrc: Oral Oral Oral Oral  SpO2: 97% 98% 98% 97%  Weight:      Height:       Weight change:  No intake or output data in the 24 hours ending 04/10/23 0905      Labs: RENAL PANEL Recent Labs  Lab 04/04/23 1134 04/05/23 0822 04/08/23 1159  NA 136 132* 132*  K 6.7* 5.1 5.2*  CL 94* 92* 91*  CO2 17* 22 23  GLUCOSE 101* 83 94  BUN 107* 56* 82*  CREATININE 21.42* 14.24* 16.46*  CALCIUM 8.6* 9.7 9.3  PHOS 11.9*  --  9.1*  ALBUMIN 3.4*  --  3.4*    Liver Function Tests: Recent Labs  Lab 04/04/23 1134 04/08/23 1159  ALBUMIN 3.4* 3.4*   No results for input(s): "LIPASE", "AMYLASE" in the last 168 hours. No results for  input(s): "AMMONIA" in the last 168 hours. CBC: Recent Labs    04/06/23 0537 04/07/23 0751 04/08/23 0749 04/09/23 0442 04/10/23 0446  HGB 11.8* 12.2* 11.9* 10.7* 11.0*  MCV 90.7 92.3 92.0 91.6 92.6    Cardiac Enzymes: No results for input(s): "CKTOTAL", "CKMB", "CKMBINDEX", "TROPONINI" in the last 168 hours. CBG: Recent Labs  Lab 04/09/23 1607 04/09/23 1934 04/09/23 2316 04/10/23 0343 04/10/23 0811  GLUCAP 106* 79 77 73 123*    Iron Studies: No results for input(s): "IRON", "TIBC", "TRANSFERRIN", "FERRITIN" in the last 72 hours. Studies/Results: No results found.  Medications: Infusions:    Scheduled Medications:  atorvastatin  40 mg Oral Daily   Chlorhexidine Gluconate Cloth  6 each Topical Q0600   cinacalcet  60 mg Oral Q supper   levETIRAcetam  500 mg Oral BID   sevelamer carbonate  3,200 mg Oral TID WC   warfarin  7.5 mg Oral ONCE-1600   Warfarin - Pharmacist Dosing Inpatient   Does not apply q1600    have reviewed scheduled and prn medications.  Physical Exam: General:NAD, comfortable, laying flat in bed resting comfortably Heart:RRR, s1s2 Lungs: cta bl Abdomen:soft, Non-tender, non-distended Extremities:No edema Dialysis  Access: Right IJ TDC, nonfunctional lue avf  Emmaleah Meroney 04/10/2023,9:05 AM  LOS: 14 days

## 2023-04-10 NOTE — Progress Notes (Signed)
PHARMACY - ANTICOAGULATION CONSULT NOTE  Pharmacy Consult for warfarin Indication:  APLA syndrome  Allergies  Allergen Reactions   Zyrtec [Cetirizine] Swelling    FACIAL SWELLING, NOT CATEGORIZED   Cetirizine & Related Swelling    Patient Measurements: Height: 6\' 1"  (185.4 cm) Weight: 103.3 kg (227 lb 11.8 oz) IBW/kg (Calculated) : 79.9 Heparin Dosing Weight: 103 kg   Vital Signs: Temp: 97.6 F (36.4 C) (10/23 0338) Temp Source: Oral (10/23 0338) BP: 124/86 (10/23 0338) Pulse Rate: 63 (10/23 0338)  Labs: Recent Labs    04/07/23 1812 04/08/23 0749 04/08/23 1159 04/09/23 0442 04/10/23 0446  HGB  --  11.9*  --  10.7* 11.0*  HCT  --  36.6*  --  32.8* 33.9*  PLT  --  174  --  209 200  LABPROT  --  27.2*  --  31.6* 33.3*  INR  --  2.5*  --  3.0* 3.2*  HEPARINUNFRC <0.10* 0.23*  --  0.27*  --   CREATININE  --   --  16.46*  --   --    Estimated Creatinine Clearance: 7.3 mL/min (A) (by C-G formula based on SCr of 16.46 mg/dL (H)).  Medical History: Past Medical History:  Diagnosis Date   ESRD on hemodialysis (HCC)    Hypertension    Lupus    Seizures (HCC)    Seizures (HCC)    Stroke (HCC)    76 or 42 years of age.     Wears glasses    Assessment: 42 yo M presented with seizures with HD. Patient has history antiphospholipid syndrome, leading to hypercoagulable state and causing hx of strokes. INR on admission 1.1 - most likely not taking Warfarin at home. Warfarin held and sq heparin initiated in interim. MRI showing multiple acute and subacute infarcts. Per MD, discussed with neurology and would like to start warfarin and use heparin bridge until therapeutic.  10/23: INR remains therapeutic at 3.2, no longer on parenteral therapy. CBC stable.    Goal of Therapy:  Heparin level 0.3-0.5 units/ml INR 2.5-3.5 per neurology  Monitor platelets by anticoagulation protocol: Yes   Plan:  Give warfarin 7.5 mg PO x1 dose Check INR daily while on warfarin Continue to  monitor H&H and platelets  Thank you for allowing pharmacy to be a part of this patient's care.  Jani Gravel, PharmD Clinical Pharmacist  04/10/2023 7:07 AM

## 2023-04-10 NOTE — TOC Progression Note (Signed)
Transition of Care [facility]) - Progression Note    Patient Details  Name: [name] [name] MRN: [id number] Date of Birth: March 19, 1981  Transition of Care [facility]) CM/SW Contact  Treysen Sudbeck Felipa Emory, Student-Social Work Phone Number: 04/10/2023, 11:13 AM  Clinical Narrative:   MSW Student and CSW met with patient to discuss SNF options. Patient said he had not had time to review them and gave permission to call friend Crystal to assist. Voicemail was left for [employer]. Awaiting callback    Expected Discharge Plan: Skilled Nursing Facility    Expected Discharge Plan and Services                                               Social Determinants of Health (SDOH) Interventions SDOH Screenings   Food Insecurity: No Food Insecurity (04/01/2023)  Housing: Low Risk  (04/01/2023)  Transportation Needs: No Transportation Needs (04/01/2023)  Utilities: Not At Risk (04/01/2023)  Alcohol Screen: Low Risk  (03/26/2023)  Depression (PHQ2-9): Low Risk  (03/26/2023)  Financial Resource Strain: Low Risk  (03/26/2023)  Physical Activity: Inactive (03/26/2023)  Social Connections: Moderately Isolated (03/26/2023)  Stress: No Stress Concern Present (03/26/2023)  Tobacco Use: Unknown (04/01/2023)  Health Literacy: Adequate Health Literacy (03/26/2023)    Readmission Risk Interventions    03/28/2023    2:57 PM 01/25/2023    3:40 PM  Readmission Risk Prevention Plan  Post Dischage Appt    Medication Screening    Transportation Screening Complete   Medication Review (RN Care Manager) Referral to Pharmacy   PCP or Specialist appointment within 3-5 days of discharge Complete   HRI or Home Care Consult Complete   SW Recovery Care/Counseling Consult Complete   Palliative Care Screening Not Applicable   Skilled Nursing Facility Not Applicable      Information is confidential and restricted. Go to Review Flowsheets to unlock data.

## 2023-04-11 DIAGNOSIS — R569 Unspecified convulsions: Secondary | ICD-10-CM | POA: Diagnosis not present

## 2023-04-11 LAB — COMPREHENSIVE METABOLIC PANEL
ALT: 20 U/L (ref 0–44)
AST: 21 U/L (ref 15–41)
Albumin: 3.6 g/dL (ref 3.5–5.0)
Alkaline Phosphatase: 64 U/L (ref 38–126)
Anion gap: 20 — ABNORMAL HIGH (ref 5–15)
BUN: 89 mg/dL — ABNORMAL HIGH (ref 6–20)
CO2: 21 mmol/L — ABNORMAL LOW (ref 22–32)
Calcium: 8.8 mg/dL — ABNORMAL LOW (ref 8.9–10.3)
Chloride: 92 mmol/L — ABNORMAL LOW (ref 98–111)
Creatinine, Ser: 17.18 mg/dL — ABNORMAL HIGH (ref 0.61–1.24)
GFR, Estimated: 3 mL/min — ABNORMAL LOW (ref >60)
Glucose, Bld: 115 mg/dL — ABNORMAL HIGH (ref 70–99)
Potassium: 4.7 mmol/L (ref 3.5–5.1)
Sodium: 133 mmol/L — ABNORMAL LOW (ref 135–145)
Total Bilirubin: 0.5 mg/dL (ref 0.3–1.2)
Total Protein: 7.5 g/dL (ref 6.5–8.1)

## 2023-04-11 LAB — CBC WITH DIFFERENTIAL/PLATELET
Abs Immature Granulocytes: 0.04 10*3/uL (ref 0.00–0.07)
Basophils Absolute: 0.1 10*3/uL (ref 0.0–0.1)
Basophils Relative: 1 %
Eosinophils Absolute: 0.4 10*3/uL (ref 0.0–0.5)
Eosinophils Relative: 4 %
HCT: 33.4 % — ABNORMAL LOW (ref 39.0–52.0)
Hemoglobin: 10.7 g/dL — ABNORMAL LOW (ref 13.0–17.0)
Immature Granulocytes: 1 %
Lymphocytes Relative: 17 %
Lymphs Abs: 1.5 10*3/uL (ref 0.7–4.0)
MCH: 29.6 pg (ref 26.0–34.0)
MCHC: 32 g/dL (ref 30.0–36.0)
MCV: 92.3 fL (ref 80.0–100.0)
Monocytes Absolute: 0.7 10*3/uL (ref 0.1–1.0)
Monocytes Relative: 8 %
Neutro Abs: 6.2 10*3/uL (ref 1.7–7.7)
Neutrophils Relative %: 69 %
Platelets: 221 10*3/uL (ref 150–400)
RBC: 3.62 MIL/uL — ABNORMAL LOW (ref 4.22–5.81)
RDW: 13.9 % (ref 11.5–15.5)
WBC: 8.8 10*3/uL (ref 4.0–10.5)
nRBC: 0 % (ref 0.0–0.2)

## 2023-04-11 LAB — PROTIME-INR
INR: 3.9 — ABNORMAL HIGH (ref 0.8–1.2)
Prothrombin Time: 38.8 s — ABNORMAL HIGH (ref 11.4–15.2)

## 2023-04-11 LAB — MAGNESIUM: Magnesium: 2.5 mg/dL — ABNORMAL HIGH (ref 1.7–2.4)

## 2023-04-11 LAB — GLUCOSE, CAPILLARY: Glucose-Capillary: 87 mg/dL (ref 70–99)

## 2023-04-11 LAB — PHOSPHORUS: Phosphorus: 9.6 mg/dL — ABNORMAL HIGH (ref 2.5–4.6)

## 2023-04-11 MED ORDER — HEPARIN SODIUM (PORCINE) 1000 UNIT/ML IJ SOLN
4000.0000 [IU] | Freq: Once | INTRAMUSCULAR | Status: AC
  Administered 2023-04-11: 4000 [IU]
  Filled 2023-04-11: qty 4

## 2023-04-11 MED ORDER — WARFARIN SODIUM 2.5 MG PO TABS
2.5000 mg | ORAL_TABLET | Freq: Once | ORAL | Status: AC
  Administered 2023-04-11: 2.5 mg via ORAL
  Filled 2023-04-11: qty 1

## 2023-04-11 NOTE — Progress Notes (Signed)
PT Cancellation Note  Patient Details Name: [name] [name] MRN: [id number] DOB: Jan 22, 1981   Cancelled Treatment:    Reason Eval/Treat Not Completed: Patient at procedure or test/unavailable. Pt currently at HD, Will check back as schedule allows to continue with PT POC.    Marylynn Pearson 04/11/2023, 8:39 AM  Conni Slipper, PT, DPT Acute Rehabilitation Services Secure Chat Preferred Office: [phone]

## 2023-04-11 NOTE — Plan of Care (Signed)
  Problem: Education: Goal: Knowledge of disease or condition will improve Outcome: Progressing Goal: Knowledge of secondary prevention will improve (MUST DOCUMENT ALL) Outcome: Progressing Goal: Knowledge of patient specific risk factors will improve [name] [name] N/A or DELETE if not current risk factor) Outcome: Progressing   Problem: Ischemic Stroke/TIA Tissue Perfusion: Goal: Complications of ischemic stroke/TIA will be minimized Outcome: Progressing   Problem: Coping: Goal: Will verbalize positive feelings about self Outcome: Progressing Goal: Will identify appropriate support needs Outcome: Progressing   Problem: Health Behavior/Discharge Planning: Goal: Ability to manage health-related needs will improve Outcome: Progressing Goal: Goals will be collaboratively established with patient/family Outcome: Progressing   Problem: Self-Care: Goal: Ability to participate in self-care as condition permits will improve Outcome: Progressing Goal: Verbalization of feelings and concerns over difficulty with self-care will improve Outcome: Progressing Goal: Ability to communicate needs accurately will improve Outcome: Progressing   Problem: Education: Goal: Knowledge of General Education information will improve Description: Including pain rating scale, medication(s)/side effects and non-pharmacologic comfort measures Outcome: Progressing   Problem: Health Behavior/Discharge Planning: Goal: Ability to manage health-related needs will improve Outcome: Progressing   Problem: Clinical Measurements: Goal: Ability to maintain clinical measurements within normal limits will improve Outcome: Progressing Goal: Will remain free from infection Outcome: Progressing Goal: Diagnostic test results will improve Outcome: Progressing Goal: Respiratory complications will improve Outcome: Progressing Goal: Cardiovascular complication will be avoided Outcome: Progressing   Problem: Activity: Goal:  Risk for activity intolerance will decrease Outcome: Progressing   Problem: Coping: Goal: Level of anxiety will decrease Outcome: Progressing   Problem: Pain Managment: Goal: General experience of comfort will improve Outcome: Progressing   Problem: Safety: Goal: Ability to remain free from injury will improve Outcome: Progressing   Problem: Skin Integrity: Goal: Risk for impaired skin integrity will decrease Outcome: Progressing

## 2023-04-11 NOTE — Progress Notes (Signed)
PROGRESS NOTE    [name] [name]  WUJ:[id number] DOB: 1981-04-18 DOA: 03/27/2023 PCP: Claiborne Rigg, NP   Brief Narrative:  This 42 years old male with PMH significant for essential hypertension, ESRD on hemodialysis, SLE with APLA previously on Eliquis, CVA, chronic thrombocytopenia, anemia of chronic disease, history of seizures, history of medication noncompliance presented to the ED from dialysis for not acting himself during dialysis.  Patient was found to have active seizures in the ER with tongue biting and was hypoxic with SpO2 of 60% on room air which improved to 100% on nonrebreather and was treated with IV Ativan.  Patient remained postictal in the ER occasionally agitated.  He was loaded with Keppra.  Neurology was consulted.  CT head without acute bleed but possibly shows new stroke.  Patient had multiple ER visits for breakthrough seizures.  MRI showed acute infarct of the left PCA territory, no hemorrhage or mass effect.  Patient was admitted in the ICU. TRH pickup 03/29/2023   Medically optimized and NR therapeutic at 3 on 10/22.  CIR declined.  TOC working up for SNF, has cognitive/insight issues and unsafe to discharge home by self.  DC pending bed availability.   Assessment & Plan:   Principal Problem:   Seizure [facility]) Active Problems:   Cerebrovascular accident (CVA) due to embolism of cerebral artery (HCC)  Seizure with postictal encephalopathy / History of seizures: History of complex partial seizures. Multiple ED visits for breakthrough seizures.  Questionable compliance-reportedly misses about 3-4 Doses of Keppra per week. Admitted for seizure and aspiration pneumonitis.  Received Ativan and Keppra load EEG showed moderate to diffuse encephalopathy, No seizures or epileptiform discharges. Continue Keppra 500 Mg twice daily. Seizure precautions.  No further seizures reported.   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 Prior left PCA  infarct in August 2024. Carotid Dopplers at that time unremarkable. Had been discharged on Eliquis and Lipitor. MRI showed multiple acute and subacute infarcts.  Per neurology, likely secondary to antiphospholipid syndrome on Coumadin with subtherapeutic INR and noncompliance. Noncompliant to warfarin, now on warfarin with heparin IV bridge. Compliance has been counseled this admission. Continue Olanzapine prn for agitation Due to somnolence on 10/16, obtained CT head and EEG without acute abnormalities.  Mental status has improved and stable since. He has cognitive impairment and poor insight into his overall medical condition and physical impairment, recommending CIR-but per CIR, not a candidate for their department.  TOC working on SNF.   Acute hypoxic respiratory failure: Likely multifactorial. Resolved.   ESRD on MWF HD/hyperkalemia Nephrology following for HD needs. Stable. HD today.   Metabolic bone disease Significant hyperphosphatemia >30.   Nephrology resuming binder and Sensipar.  Phosphorus 11.9.   Essential hypertension: Controlled on as needed hydralazine.   Hyperlipidemia: Continue Lipitor   Anemia in ESRD: Stable.   Thrombocytopenia Appears new compared to August 2024.  Resolved.   Acute encephalopathy: > Resolved. Noted on 10/16. CT head without acute findings.  EEG suggestive of moderate diffuse encephalopathy.  No seizures or epileptiform discharges. Alert and oriented.  No agitation.  Cognition impaired.    Antiphospholipid Syndrome  SLE  Poor Med Adherence: Patient has tried multiple anticoagulants including warfarin, eliquis, xarelto, but struggles w/ med adherence.  Looks like he was most recently taking Warfarin, but INR on admission suggests pt was not taking regularly. Although warfarin is first line for antiphopsolipid syndrome, given pt has poor follow-up and poor med adherence, Eliquis may have been a better  option.  However, as per neurology  note, given patient's ESRD on dialysis and antiphospholipid syndrome, Coumadin may be the best option and he may not have much choices. Completed IV heparin bridge.  Goal INR 2.5-3.5.  INR 3 on 10/22.  Off heparin bridge.   Body mass index is 30.05 kg/m.     DVT prophylaxis:  Coumadin Code Status: Full code Family Communication:No family at bed side Disposition Plan:    Status is: Inpatient Remains inpatient appropriate because:   From a medical standpoint, he is stable for DC to next level of care.  Extremely unsafe to return home immediately due to cognitive impairment, poor insight, lives alone, may not be able to manage his medications, HD needs.  Hopefully can go to SNF with improvement in all of these prior to returning home.  Declined by CIR.  Awaiting SNF.  Discussed with TOC 10/22.      Consultants:  Nephrology Neurology  Procedures:  Antimicrobials:  Anti-infectives (From admission, onward)    Start     Dose/Rate Route Frequency Ordered Stop   03/27/23 1545  cefTRIAXone (ROCEPHIN) 1 g in sodium chloride 0.9 % 100 mL IVPB        1 g 200 mL/hr over 30 Minutes Intravenous  Once 03/27/23 1544 03/27/23 1740   03/27/23 1545  azithromycin (ZITHROMAX) 500 mg in sodium chloride 0.9 % 250 mL IVPB  Status:  Discontinued        500 mg 250 mL/hr over 60 Minutes Intravenous  Once 03/27/23 1544 03/27/23 1727      Subjective: Patient was seen and examined at bedside.  Overnight events noted.   Patient was having dialysis today.  Patient denies any concerns.  Objective: Vitals:   04/11/23 0930 04/11/23 0945 04/11/23 0948 04/11/23 1121  BP: 96/63 97/69 104/69 (!) 129/91  Pulse: 72 72 72 85  Resp:  16 16 14   Temp:   98 F (36.7 C) 97.7 F (36.5 C)  TempSrc:    Axillary  SpO2: 100% 100% 100% 99%  Weight:      Height:        Intake/Output Summary (Last 24 hours) at 04/11/2023 1313 Last data filed at 04/11/2023 0948 Gross per 24 hour  Intake --  Output 1500 ml  Net  -1500 ml   Filed Weights   04/06/23 1315 04/08/23 1200 04/08/23 1559  Weight: 102.5 kg 105.1 kg 103.3 kg    Examination:  General exam: Appears comfortable, not in any acute distress. Deconditioned. Respiratory system: CTA bilaterally. Respiratory effort normal.  RR 15 Cardiovascular system: S1 & S2 heard, RRR. No JVD, murmurs, rubs, gallops or clicks. No pedal edema. Gastrointestinal system: Abdomen is non distended, soft and non tender.  Normal bowel sounds heard. Central nervous system: Alert and oriented x 3. No focal neurological deficits. Extremities: No edema, no cyanosis, no clubbing Skin: No rashes, lesions or ulcers Psychiatry: Judgement and insight appear normal. Mood & affect appropriate.     Data Reviewed: I have personally reviewed following labs and imaging studies  CBC: Recent Labs  Lab 04/07/23 0751 04/08/23 0749 04/09/23 0442 04/10/23 0446 04/11/23 0639  WBC 8.7 7.6 9.0 7.1 8.8  NEUTROABS  --   --   --   --  6.2  HGB 12.2* 11.9* 10.7* 11.0* 10.7*  HCT 37.2* 36.6* 32.8* 33.9* 33.4*  MCV 92.3 92.0 91.6 92.6 92.3  PLT 161 174 209 200 221   Basic Metabolic Panel: Recent Labs  Lab 04/05/23 0822 04/08/23 1159  04/11/23 0639  NA 132* 132* 133*  K 5.1 5.2* 4.7  CL 92* 91* 92*  CO2 22 23 21*  GLUCOSE 83 94 115*  BUN 56* 82* 89*  CREATININE 14.24* 16.46* 17.18*  CALCIUM 9.7 9.3 8.8*  MG  --   --  2.5*  PHOS  --  9.1* 9.6*   GFR: Estimated Creatinine Clearance: 7 mL/min (A) (by C-G formula based on SCr of 17.18 mg/dL (H)). Liver Function Tests: Recent Labs  Lab 04/08/23 1159 04/11/23 0639  AST  --  21  ALT  --  20  ALKPHOS  --  64  BILITOT  --  0.5  PROT  --  7.5  ALBUMIN 3.4* 3.6   No results for input(s): "LIPASE", "AMYLASE" in the last 168 hours. No results for input(s): "AMMONIA" in the last 168 hours. Coagulation Profile: Recent Labs  Lab 04/07/23 0751 04/08/23 0749 04/09/23 0442 04/10/23 0446 04/11/23 0639  INR 1.9* 2.5* 3.0*  3.2* 3.9*   Cardiac Enzymes: No results for input(s): "CKTOTAL", "CKMB", "CKMBINDEX", "TROPONINI" in the last 168 hours. BNP (last 3 results) No results for input(s): "PROBNP" in the last 8760 hours. HbA1C: No results for input(s): "HGBA1C" in the last 72 hours. CBG: Recent Labs  Lab 04/10/23 1149 04/10/23 1641 04/10/23 1943 04/10/23 2332 04/11/23 0340  GLUCAP 89 80 122* 104* 87   Lipid Profile: No results for input(s): "CHOL", "HDL", "LDLCALC", "TRIG", "CHOLHDL", "LDLDIRECT" in the last 72 hours. Thyroid Function Tests: No results for input(s): "TSH", "T4TOTAL", "FREET4", "T3FREE", "THYROIDAB" in the last 72 hours. Anemia Panel: No results for input(s): "VITAMINB12", "FOLATE", "FERRITIN", "TIBC", "IRON", "RETICCTPCT" in the last 72 hours. Sepsis Labs: No results for input(s): "PROCALCITON", "LATICACIDVEN" in the last 168 hours.  No results found for this or any previous visit (from the past 240 hour(s)).   Radiology Studies: No results found.  Scheduled Meds:  atorvastatin  40 mg Oral Daily   Chlorhexidine Gluconate Cloth  6 each Topical Q0600   cinacalcet  60 mg Oral Q supper   levETIRAcetam  500 mg Oral BID   sevelamer carbonate  3,200 mg Oral TID WC   warfarin  2.5 mg Oral ONCE-1600   Warfarin - Pharmacist Dosing Inpatient   Does not apply q1600   Continuous Infusions:   LOS: 15 days    Time spent: 35 mins    Willeen Niece, MD [facility]   If 7PM-7AM, please contact night-coverage

## 2023-04-11 NOTE — Progress Notes (Signed)
   04/11/23 0948  Vitals  Temp 98 F (36.7 C)  Pulse Rate 72  Resp 16  BP 104/69  SpO2 100 %  O2 Device Room Air  Oxygen Therapy  Patient Activity (if Appropriate) In bed  Post Treatment  Dialyzer Clearance Clear  Hemodialysis Intake (mL) 0 mL  Liters Processed 72  Fluid Removed (mL) 1500 mL  Tolerated HD Treatment Yes   Received patient in bed to unit.  Alert and oriented.  Informed consent signed and in chart.   TX duration:3hrs  Patient tolerated well.  Transported back to the room  Alert, without acute distress.  Hand-off given to patient's nurse.   Access used: [facility] Access issues: none  Total UF removed: 1.5L Medication(s) given: none    [name] [name]

## 2023-04-11 NOTE — Progress Notes (Signed)
PHARMACY - ANTICOAGULATION CONSULT NOTE  Pharmacy Consult for warfarin Indication:  APLA syndrome  Allergies  Allergen Reactions   Zyrtec [Cetirizine] Swelling    FACIAL SWELLING, NOT CATEGORIZED   Cetirizine & Related Swelling    Patient Measurements: Height: 6\' 1"  (185.4 cm) Weight: 103.3 kg (227 lb 11.8 oz) IBW/kg (Calculated) : 79.9 Heparin Dosing Weight: 103 kg   Vital Signs: Temp: 97.9 F (36.6 C) (10/24 0630) Temp Source: Oral (10/24 0630) BP: 117/71 (10/24 0830) Pulse Rate: 88 (10/24 0830)  Labs: Recent Labs    04/08/23 1159 04/09/23 0442 04/09/23 0442 04/10/23 0446 04/11/23 0639  HGB  --  10.7*   < > 11.0* 10.7*  HCT  --  32.8*  --  33.9* 33.4*  PLT  --  209  --  200 221  LABPROT  --  31.6*  --  33.3* 38.8*  INR  --  3.0*  --  3.2* 3.9*  HEPARINUNFRC  --  0.27*  --   --   --   CREATININE 16.46*  --   --   --   --    < > = values in this interval not displayed.   Estimated Creatinine Clearance: 7.3 mL/min (A) (by C-G formula based on SCr of 16.46 mg/dL (H)).  Medical History: Past Medical History:  Diagnosis Date   ESRD on hemodialysis (HCC)    Hypertension    Lupus    Seizures (HCC)    Seizures (HCC)    Stroke (HCC)    3 or 42 years of age.     Wears glasses    Assessment: 42 yo M presented with seizures with HD. Patient has history antiphospholipid syndrome, leading to hypercoagulable state and causing hx of strokes. INR on admission 1.1 - most likely not taking Warfarin at home. Warfarin held and sq heparin initiated in interim. MRI showing multiple acute and subacute infarcts. Per MD, discussed with neurology and would like to start warfarin and use heparin bridge until therapeutic.  10/24: INR increased from 3.2>>3.9, likely reflective of 10 mg doses from a few days ago. Patient has received 7.5 mg doses the past few days, will administer a lower dose today to keep warfarin in range.  CBC stable.    Goal of Therapy:  Heparin level 0.3-0.5  units/ml INR 2.5-3.5 per neurology  Monitor platelets by anticoagulation protocol: Yes   Plan:  Give warfarin 2.5 mg PO x1 dose Check INR daily while on warfarin Continue to monitor H&H and platelets  Thank you for allowing pharmacy to be a part of this patient's care.  Jani Gravel, PharmD Clinical Pharmacist  04/11/2023 8:47 AM

## 2023-04-11 NOTE — Procedures (Signed)
I was present at this dialysis session. I have reviewed the session itself and made appropriate changes.  Finishing up treatment. HD was delayed due to inpatient census/staffing. Back on MWF HD schedule tomorrow.  Filed Weights   04/06/23 1315 04/08/23 1200 04/08/23 1559  Weight: 102.5 kg 105.1 kg 103.3 kg    Recent Labs  Lab 04/11/23 0639  NA 133*  K 4.7  CL 92*  CO2 21*  GLUCOSE 115*  BUN 89*  CREATININE 17.18*  CALCIUM 8.8*  PHOS 9.6*    Recent Labs  Lab 04/04/23 1134 04/05/23 0822 04/09/23 0442 04/10/23 0446 04/11/23 0639  WBC 9.3   < > 9.0 7.1 8.8  NEUTROABS 6.7  --   --   --  6.2  HGB 11.5*   < > 10.7* 11.0* 10.7*  HCT 33.4*   < > 32.8* 33.9* 33.4*  MCV 88.4   < > 91.6 92.6 92.3  PLT 109*   < > 209 200 221   < > = values in this interval not displayed.    Scheduled Meds:  atorvastatin  40 mg Oral Daily   Chlorhexidine Gluconate Cloth  6 each Topical Q0600   cinacalcet  60 mg Oral Q supper   heparin sodium (porcine)  4,000 Units Intracatheter Once   levETIRAcetam  500 mg Oral BID   sevelamer carbonate  3,200 mg Oral TID WC   warfarin  2.5 mg Oral ONCE-1600   Warfarin - Pharmacist Dosing Inpatient   Does not apply q1600   Continuous Infusions: PRN Meds:.acetaminophen, docusate sodium, hydrALAZINE, LORazepam, melatonin, OLANZapine, ondansetron (ZOFRAN) IV, polyethylene glycol   [name], MD [facility] 04/11/2023, 9:39 AM

## 2023-04-12 DIAGNOSIS — R569 Unspecified convulsions: Secondary | ICD-10-CM | POA: Diagnosis not present

## 2023-04-12 LAB — PROTIME-INR
INR: 3.5 — ABNORMAL HIGH (ref 0.8–1.2)
Prothrombin Time: 35.5 s — ABNORMAL HIGH (ref 11.4–15.2)

## 2023-04-12 MED ORDER — WARFARIN SODIUM 7.5 MG PO TABS
7.5000 mg | ORAL_TABLET | Freq: Once | ORAL | Status: AC
  Administered 2023-04-12: 7.5 mg via ORAL
  Filled 2023-04-12: qty 1

## 2023-04-12 NOTE — Progress Notes (Signed)
PHARMACY - ANTICOAGULATION CONSULT NOTE  Pharmacy Consult for warfarin Indication:  APLA syndrome  Allergies  Allergen Reactions   Zyrtec [Cetirizine] Swelling    FACIAL SWELLING, NOT CATEGORIZED   Cetirizine & Related Swelling    Patient Measurements: Height: 6\' 1"  (185.4 cm) Weight: 109.1 kg (240 lb 8.4 oz) IBW/kg (Calculated) : 79.9 Heparin Dosing Weight: 103 kg   Vital Signs: Temp: 98.4 F (36.9 C) (10/25 1128) Temp Source: Oral (10/25 1128) BP: 125/90 (10/25 1128) Pulse Rate: 81 (10/25 1128)  Labs: Recent Labs    04/10/23 0446 04/11/23 0639 04/12/23 1036  HGB 11.0* 10.7*  --   HCT 33.9* 33.4*  --   PLT 200 221  --   LABPROT 33.3* 38.8* 35.5*  INR 3.2* 3.9* 3.5*  CREATININE  --  17.18*  --    Estimated Creatinine Clearance: 7.2 mL/min (A) (by C-G formula based on SCr of 17.18 mg/dL (H)).  Medical History: Past Medical History:  Diagnosis Date   ESRD on hemodialysis (HCC)    Hypertension    Lupus    Seizures (HCC)    Seizures (HCC)    Stroke (HCC)    62 or 42 years of age.     Wears glasses    Assessment: 42 yo M presented with seizures with HD. Patient has history antiphospholipid syndrome, leading to hypercoagulable state and causing hx of strokes. INR on admission 1.1 - most likely not taking Warfarin at home. Warfarin held and sq heparin initiated in interim. MRI showing multiple acute and subacute infarcts. Per MD, discussed with neurology and would like to start warfarin and use heparin bridge until therapeutic.  10/24: INR increased from 3.2>>3.9 >> 3.5. Patient has received 7.5 mg doses the past few days, will administer a lower dose today to keep warfarin in range.  CBC stable.     Goal of Therapy:  Heparin level 0.3-0.5 units/ml INR 2.5-3.5 per neurology  Monitor platelets by anticoagulation protocol: Yes   Plan:  Give warfarin 7.5 mg PO x1 dose Check INR daily while on warfarin Continue to monitor H&H and platelets  Thank you for  allowing pharmacy to be a part of this patient's care.  Thelma Barge, PharmD Clinical Pharmacist

## 2023-04-12 NOTE — Progress Notes (Signed)
Speech Language Pathology Treatment: Cognitive-Linquistic  Patient Details Name: [name] [name] MRN: [id number] DOB: 04-25-1981 Today's Date: 04/12/2023 Time: 4098-1191 SLP Time Calculation (min) (ACUTE ONLY): 18 min  Assessment / Plan / Recommendation Clinical Impression  Pt seen for skilled SLP session to address functional cognitive goals including attention, basic problem solving, and improving cognitive processing speed. Pt oriented x4.  He reported he lives independently at baseline and manages his finances, medications, and ADLs independently. He uses public transit to get to dialysis x3 days a week. Pt required mod-max cues to complete basic addition/subtraction word problem with 50% accuracy. He was able to complete serial numbers reversal task f= 2-3 with <20% accuracy and unable to complete this task with days of the week despite max cues. Pt named x10 animals for generative naming task when given 1 minute, indicating reduced attention and processing speed. Pt educated on cognitive deficits identified. He agreed that he could benefit from continued SLP intervention to address cognitive goals in order to maximize his level of independence. Continue SLP PoC.   HPI HPI: Pt is a 42 yo male presenting 10/9 from dialysis with AMS.  Patient was found to have active seizures in the ER with tongue biting and was hypoxic requiring NRB. MRI 10/12 showed scattered foci of acute and subacute infarction throughout both cerebral hemispheres, including the bilateral ACA, MCA and PCA territories. PMH includes: CVA (L PCA, August 2024; SLP eval at that time with severe cognitive impairment with concern for acute on chronic decline), HTN, ESRD on HD, SLE with APLA, chronic thrombocytopenia, anemia of chronic disease, history of seizures, history of medication noncompliance      SLP Plan  Continue with current plan of care      Recommendations for follow up therapy are one component of a  multi-disciplinary discharge planning process, led by the attending physician.  Recommendations may be updated based on patient status, additional functional criteria and insurance authorization.    Recommendations                        Intermittent Supervision/Assistance Cognitive communication deficit (Y78.295)     Continue with current plan of care     Ellery Plunk  04/12/2023, 2:39 PM

## 2023-04-12 NOTE — Progress Notes (Signed)
PROGRESS NOTE    [name] [name]  GMW:[id number] DOB: 1981-06-08 DOA: 03/27/2023 PCP: Claiborne Rigg, NP   Brief Narrative:  This 42 years old male with PMH significant for essential hypertension, ESRD on hemodialysis, SLE with APLA previously on Eliquis, CVA, chronic thrombocytopenia, anemia of chronic disease, history of seizures, history of medication noncompliance presented to the ED from dialysis for not acting himself during dialysis.  Patient was found to have active seizures in the ER with tongue biting and was hypoxic with SpO2 of 60% on room air which improved to 100% on nonrebreather and was treated with IV Ativan.  Patient remained postictal in the ER occasionally agitated.  He was loaded with Keppra.  Neurology was consulted.  CT head without acute bleed but possibly shows new stroke.  Patient had multiple ER visits for breakthrough seizures.  MRI showed acute infarct of the left PCA territory, no hemorrhage or mass effect.  Patient was admitted in the ICU. TRH pickup 03/29/2023   Medically optimized and INR therapeutic at 3.0 on 10/22.  CIR declined.  TOC working up for SNF, has cognitive/insight issues and unsafe to discharge home by self.  DC pending bed availability.   Assessment & Plan:   Principal Problem:   Seizure [facility]) Active Problems:   Cerebrovascular accident (CVA) due to embolism of cerebral artery (HCC)  Seizure with postictal encephalopathy / History of seizures: History of complex partial seizures. Multiple ED visits for breakthrough seizures.  Questionable compliance-reportedly misses about 3-4 Doses of Keppra per week. Admitted for seizure and aspiration pneumonitis.  Received Ativan and Keppra load EEG showed moderate to diffuse encephalopathy, No seizures or epileptiform discharges. Continue Keppra 500 Mg twice daily. Seizure precautions.  No further seizures reported.   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 Prior left PCA  infarct in August 2024. Carotid Dopplers at that time unremarkable. Had been discharged on Eliquis and Lipitor. MRI showed multiple acute and subacute infarcts.  Per neurology, likely secondary to antiphospholipid syndrome on Coumadin with subtherapeutic INR and noncompliance. Noncompliant to warfarin, now on warfarin with heparin IV bridge. Compliance has been counseled this admission. Continue Olanzapine prn for agitation Due to somnolence on 10/16, obtained CT head and EEG without acute abnormalities.  Mental status has improved and stable since. He has cognitive impairment and poor insight into his overall medical condition and physical impairment, recommending CIR-but per CIR, not a candidate for their department.  TOC working on SNF.   Acute hypoxic respiratory failure: Likely multifactorial. Resolved.   ESRD on MWF HD/hyperkalemia Nephrology following for HD needs. Stable. HD as per schedule.   Metabolic bone disease Significant hyperphosphatemia >30.   Nephrology resuming binder and Sensipar.  Phosphorus 11.9.   Essential hypertension: Controlled on as needed hydralazine.   Hyperlipidemia: Continue Lipitor   Anemia in ESRD: Stable.   Thrombocytopenia Appears new compared to August 2024.  Resolved.   Acute encephalopathy: > Resolved. Noted on 10/16. CT head without acute findings.  EEG suggestive of moderate diffuse encephalopathy.  No seizures or epileptiform discharges. Alert and oriented.  No agitation.     Antiphospholipid Syndrome  SLE  Poor Med Adherence: Patient has tried multiple anticoagulants including warfarin, eliquis, xarelto, but struggles w/ med adherence.  Looks like he was most recently taking Warfarin, but INR on admission suggests pt was not taking regularly. Although warfarin is first line for antiphopsolipid syndrome, given pt has poor follow-up and poor med adherence, Eliquis may have been a better  option.  However, as per neurology note, given  patient's ESRD on dialysis and antiphospholipid syndrome, Coumadin may be the best option and he may not have much choices. Completed IV heparin bridge.  Goal INR 2.5-3.5.  INR 3 on 10/22.  Off heparin bridge.   Body mass index is 30.05 kg/m.     DVT prophylaxis:  Coumadin Code Status: Full code Family Communication:No family at bed side Disposition Plan:    Status is: Inpatient Remains inpatient appropriate because:   From a medical standpoint, he is stable for DC to next level of care.  Extremely unsafe to return home immediately due to cognitive impairment, poor insight, lives alone, may not be able to manage his medications, HD needs.  Hopefully can go to SNF with improvement in all of these prior to returning home.  Declined by CIR.  Awaiting SNF.  Discussed with TOC 10/22.      Consultants:  Nephrology Neurology  Procedures:  Antimicrobials:  Anti-infectives (From admission, onward)    Start     Dose/Rate Route Frequency Ordered Stop   03/27/23 1545  cefTRIAXone (ROCEPHIN) 1 g in sodium chloride 0.9 % 100 mL IVPB        1 g 200 mL/hr over 30 Minutes Intravenous  Once 03/27/23 1544 03/27/23 1740   03/27/23 1545  azithromycin (ZITHROMAX) 500 mg in sodium chloride 0.9 % 250 mL IVPB  Status:  Discontinued        500 mg 250 mL/hr over 60 Minutes Intravenous  Once 03/27/23 1544 03/27/23 1727      Subjective: Patient was seen and examined at bedside.  Overnight events noted.   Patient denies any concerns.  Patient reports doing better.  Objective: Vitals:   04/12/23 0348 04/12/23 0500 04/12/23 0759 04/12/23 1128  BP: 119/87  112/73 (!) 125/90  Pulse: 75  80 81  Resp: 18  18 18   Temp: 98.3 F (36.8 C)  98.7 F (37.1 C) 98.4 F (36.9 C)  TempSrc: Oral  Oral Oral  SpO2: 98%  100% 99%  Weight:  109.1 kg    Height:        Intake/Output Summary (Last 24 hours) at 04/12/2023 1213 Last data filed at 04/12/2023 1130 Gross per 24 hour  Intake 970 ml  Output --   Net 970 ml   Filed Weights   04/08/23 1200 04/08/23 1559 04/12/23 0500  Weight: 105.1 kg 103.3 kg 109.1 kg    Examination:  General exam: Appears comfortable, not in any acute distress. Deconditioned. Respiratory system: CTA bilaterally. Respiratory effort normal.  RR 14 Cardiovascular system: S1 & S2 heard, RRR. No JVD, murmurs, rubs, gallops or clicks. No pedal edema. Gastrointestinal system: Abdomen is non distended, soft and non tender.  Normal bowel sounds heard. Central nervous system: Alert and oriented x 3. No focal neurological deficits. Extremities: No edema, no cyanosis, no clubbing Skin: No rashes, lesions or ulcers Psychiatry: Judgement and insight appear normal. Mood & affect appropriate.     Data Reviewed: I have personally reviewed following labs and imaging studies  CBC: Recent Labs  Lab 04/07/23 0751 04/08/23 0749 04/09/23 0442 04/10/23 0446 04/11/23 0639  WBC 8.7 7.6 9.0 7.1 8.8  NEUTROABS  --   --   --   --  6.2  HGB 12.2* 11.9* 10.7* 11.0* 10.7*  HCT 37.2* 36.6* 32.8* 33.9* 33.4*  MCV 92.3 92.0 91.6 92.6 92.3  PLT 161 174 209 200 221   Basic Metabolic Panel: Recent Labs  Lab 04/08/23  1159 04/11/23 0639  NA 132* 133*  K 5.2* 4.7  CL 91* 92*  CO2 23 21*  GLUCOSE 94 115*  BUN 82* 89*  CREATININE 16.46* 17.18*  CALCIUM 9.3 8.8*  MG  --  2.5*  PHOS 9.1* 9.6*   GFR: Estimated Creatinine Clearance: 7.2 mL/min (A) (by C-G formula based on SCr of 17.18 mg/dL (H)). Liver Function Tests: Recent Labs  Lab 04/08/23 1159 04/11/23 0639  AST  --  21  ALT  --  20  ALKPHOS  --  64  BILITOT  --  0.5  PROT  --  7.5  ALBUMIN 3.4* 3.6   No results for input(s): "LIPASE", "AMYLASE" in the last 168 hours. No results for input(s): "AMMONIA" in the last 168 hours. Coagulation Profile: Recent Labs  Lab 04/08/23 0749 04/09/23 0442 04/10/23 0446 04/11/23 0639 04/12/23 1036  INR 2.5* 3.0* 3.2* 3.9* 3.5*   Cardiac Enzymes: No results for  input(s): "CKTOTAL", "CKMB", "CKMBINDEX", "TROPONINI" in the last 168 hours. BNP (last 3 results) No results for input(s): "PROBNP" in the last 8760 hours. HbA1C: No results for input(s): "HGBA1C" in the last 72 hours. CBG: Recent Labs  Lab 04/10/23 1149 04/10/23 1641 04/10/23 1943 04/10/23 2332 04/11/23 0340  GLUCAP 89 80 122* 104* 87   Lipid Profile: No results for input(s): "CHOL", "HDL", "LDLCALC", "TRIG", "CHOLHDL", "LDLDIRECT" in the last 72 hours. Thyroid Function Tests: No results for input(s): "TSH", "T4TOTAL", "FREET4", "T3FREE", "THYROIDAB" in the last 72 hours. Anemia Panel: No results for input(s): "VITAMINB12", "FOLATE", "FERRITIN", "TIBC", "IRON", "RETICCTPCT" in the last 72 hours. Sepsis Labs: No results for input(s): "PROCALCITON", "LATICACIDVEN" in the last 168 hours.  No results found for this or any previous visit (from the past 240 hour(s)).   Radiology Studies: No results found.  Scheduled Meds:  atorvastatin  40 mg Oral Daily   Chlorhexidine Gluconate Cloth  6 each Topical Q0600   cinacalcet  60 mg Oral Q supper   levETIRAcetam  500 mg Oral BID   sevelamer carbonate  3,200 mg Oral TID WC   Warfarin - Pharmacist Dosing Inpatient   Does not apply q1600   Continuous Infusions:   LOS: 16 days    Time spent: 35 mins    Willeen Niece, MD [facility]   If 7PM-7AM, please contact night-coverage

## 2023-04-12 NOTE — Progress Notes (Signed)
[facility] NEPHROLOGY PROGRESS NOTE  Assessment/ Plan: Seizures - acute on chronic, takes Keppra at home but misses doses. MRI with scattered infarcts - neurology implicates subtherapeutic INR.   H/o antiphospholipid syndrome - hx SLE, has been tried on multiple anticoagulants in the past but struggles w/ medication adherence.  On coumadin   ESRD - on HD MWF.  Next dialysis today  HTN/volume - UF with HD as tolerated, continue fluid restriction.  Anemia of ESRD - Hb 10.7, no esa needs.   CKD-MBD - CCa normalized, and phos is very high. Resumed binder and sensipar. Renal diet. Po4 down to 9.1  Dispo: pending SNF  OP HD: [facility] MWF  4h   400/800   101.5kg   2/2 bath   RIJ TDC    Heparin none - last OP HD today 10/9, post wt 110.5kg (9kg over) - mircera 50 mcg IV q 2wks, last 10/2, due 10/16  - venofer 100mg  three times per week thru today - rocaltrol 2.0 mcg   [name], MD [facility]   Subjective: Seen and examined at the bedside. No complaints, no acute events overnight.   Objective Vital signs in last 24 hours: Vitals:   04/11/23 2318 04/12/23 0348 04/12/23 0500 04/12/23 0759  BP: 105/82 119/87  112/73  Pulse: 71 75  80  Resp: 16 18  18   Temp: 97.8 F (36.6 C) 98.3 F (36.8 C)  98.7 F (37.1 C)  TempSrc: Oral Oral  Oral  SpO2: 98% 98%  100%  Weight:   109.1 kg   Height:       Weight change:   Intake/Output Summary (Last 24 hours) at 04/12/2023 0946 Last data filed at 04/12/2023 0720 Gross per 24 hour  Intake 870 ml  Output 1500 ml  Net -630 ml        Labs: RENAL PANEL Recent Labs  Lab 04/08/23 1159 04/11/23 0639  NA 132* 133*  K 5.2* 4.7  CL 91* 92*  CO2 23 21*  GLUCOSE 94 115*  BUN 82* 89*  CREATININE 16.46* 17.18*  CALCIUM 9.3 8.8*  MG  --  2.5*  PHOS 9.1* 9.6*  ALBUMIN 3.4* 3.6    Liver Function Tests: Recent Labs  Lab 04/08/23 1159 04/11/23 0639  AST  --  21  ALT  --  20  ALKPHOS  --  64   BILITOT  --  0.5  PROT  --  7.5  ALBUMIN 3.4* 3.6   No results for input(s): "LIPASE", "AMYLASE" in the last 168 hours. No results for input(s): "AMMONIA" in the last 168 hours. CBC: Recent Labs    04/07/23 0751 04/08/23 0749 04/09/23 0442 04/10/23 0446 04/11/23 0639  HGB 12.2* 11.9* 10.7* 11.0* 10.7*  MCV 92.3 92.0 91.6 92.6 92.3    Cardiac Enzymes: No results for input(s): "CKTOTAL", "CKMB", "CKMBINDEX", "TROPONINI" in the last 168 hours. CBG: Recent Labs  Lab 04/10/23 1149 04/10/23 1641 04/10/23 1943 04/10/23 2332 04/11/23 0340  GLUCAP 89 80 122* 104* 87    Iron Studies: No results for input(s): "IRON", "TIBC", "TRANSFERRIN", "FERRITIN" in the last 72 hours. Studies/Results: No results found.  Medications: Infusions:    Scheduled Medications:  atorvastatin  40 mg Oral Daily   Chlorhexidine Gluconate Cloth  6 each Topical Q0600   cinacalcet  60 mg Oral Q supper   levETIRAcetam  500 mg Oral BID   sevelamer carbonate  3,200 mg Oral TID WC   Warfarin - Pharmacist Dosing Inpatient  Does not apply q1600    have reviewed scheduled and prn medications.  Physical Exam: General:NAD, comfortable, laying flat in bed resting comfortably Heart:RRR, s1s2 Lungs: cta bl Abdomen:soft, Non-tender, non-distended Extremities:No edema Dialysis Access: Right IJ TDC, nonfunctional lue avf  [name] [name] 04/12/2023,9:46 AM  LOS: 16 days
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
## 2023-04-13 DIAGNOSIS — R569 Unspecified convulsions: Secondary | ICD-10-CM | POA: Diagnosis not present

## 2023-04-13 LAB — GLUCOSE, CAPILLARY: Glucose-Capillary: 104 mg/dL — ABNORMAL HIGH (ref 70–99)

## 2023-04-13 LAB — PROTIME-INR
INR: 3.7 — ABNORMAL HIGH (ref 0.8–1.2)
Prothrombin Time: 37.2 s — ABNORMAL HIGH (ref 11.4–15.2)

## 2023-04-13 LAB — RENAL FUNCTION PANEL
Albumin: 3.3 g/dL — ABNORMAL LOW (ref 3.5–5.0)
Anion gap: 16 — ABNORMAL HIGH (ref 5–15)
BUN: 85 mg/dL — ABNORMAL HIGH (ref 6–20)
CO2: 23 mmol/L (ref 22–32)
Calcium: 8.4 mg/dL — ABNORMAL LOW (ref 8.9–10.3)
Chloride: 98 mmol/L (ref 98–111)
Creatinine, Ser: 16.04 mg/dL — ABNORMAL HIGH (ref 0.61–1.24)
GFR, Estimated: 3 mL/min — ABNORMAL LOW (ref >60)
Glucose, Bld: 87 mg/dL (ref 70–99)
Phosphorus: 8.5 mg/dL — ABNORMAL HIGH (ref 2.5–4.6)
Potassium: 4.7 mmol/L (ref 3.5–5.1)
Sodium: 137 mmol/L (ref 135–145)

## 2023-04-13 LAB — CBC
HCT: 32.1 % — ABNORMAL LOW (ref 39.0–52.0)
Hemoglobin: 10.4 g/dL — ABNORMAL LOW (ref 13.0–17.0)
MCH: 30.2 pg (ref 26.0–34.0)
MCHC: 32.4 g/dL (ref 30.0–36.0)
MCV: 93.3 fL (ref 80.0–100.0)
Platelets: 208 10*3/uL (ref 150–400)
RBC: 3.44 MIL/uL — ABNORMAL LOW (ref 4.22–5.81)
RDW: 14.1 % (ref 11.5–15.5)
WBC: 7.3 10*3/uL (ref 4.0–10.5)
nRBC: 0 % (ref 0.0–0.2)

## 2023-04-13 MED ORDER — HEPARIN SODIUM (PORCINE) 1000 UNIT/ML IJ SOLN
3200.0000 [IU] | Freq: Once | INTRAMUSCULAR | Status: AC
  Administered 2023-04-13: 3200 [IU]

## 2023-04-13 MED ORDER — WARFARIN SODIUM 5 MG PO TABS
5.0000 mg | ORAL_TABLET | Freq: Once | ORAL | Status: AC
  Administered 2023-04-13: 5 mg via ORAL
  Filled 2023-04-13: qty 1

## 2023-04-13 NOTE — Progress Notes (Signed)
Received patient in bed to unit.  Alert and oriented.  Informed consent signed and in chart.   TX duration:3.5 hours  Patient tolerated well.  Transported back to the room  Alert, without acute distress.  Hand-off given to patient's nurse.   Access used: R internal jugular HD Cath Access issues: A-V and V-A  Total UF removed: Medication(s) given: none   04/13/23 1044  Vitals  Temp 98.5 F (36.9 C)  Temp Source Oral  BP 101/71  MAP (mmHg) 80  Pulse Rate 77  ECG Heart Rate 80  Resp 16  Oxygen Therapy  SpO2 98 %  O2 Device Room Air  During Treatment Monitoring  HD Safety Checks Performed Yes  Intra-Hemodialysis Comments Tx completed;Tolerated well  Dialysis Fluid Bolus Normal Saline  Bolus Amount (mL) 300 mL  Hemodialysis Catheter Right Internal jugular Double lumen Permanent (Tunneled)  Placement Date/Time: 07/16/22 1625   Serial / Lot #: [id number]  Expiration Date: 03/21/27  Time Out: Correct patient;Correct site;Correct procedure  Maximum sterile barrier precautions: Hand hygiene;Cap;Mask;Sterile gown;Sterile gloves;Large sterile ...  Site Condition No complications  Blue Lumen Status Heparin locked;Dead end cap in place;Flushed  Red Lumen Status Heparin locked;Dead end cap in place;Flushed  Purple Lumen Status N/A  Catheter fill solution Heparin 1000 units/ml  Catheter fill volume (Arterial) 1.6 cc  Catheter fill volume (Venous) 1.6  Dressing Type Transparent  Dressing Status Antimicrobial disc in place;Clean, Dry, Intact  Drainage Description None  Dressing Change Due 04/20/23  Post treatment catheter status Capped and Clamped     Stacie Glaze LPN [facility]

## 2023-04-13 NOTE — Progress Notes (Signed)
PHARMACY - ANTICOAGULATION CONSULT NOTE  Pharmacy Consult for warfarin Indication:  APLA syndrome  Allergies  Allergen Reactions   Zyrtec [Cetirizine] Swelling    FACIAL SWELLING, NOT CATEGORIZED   Cetirizine & Related Swelling    Patient Measurements: Height: 6\' 1"  (185.4 cm) Weight: 108.7 kg (239 lb 10.2 oz) IBW/kg (Calculated) : 79.9 Heparin Dosing Weight: 103 kg   Vital Signs: Temp: 98.7 F (37.1 C) (10/26 1118) Temp Source: Oral (10/26 1118) BP: 117/72 (10/26 1118) Pulse Rate: 79 (10/26 1118)  Labs: Recent Labs    04/11/23 0639 04/12/23 1036 04/13/23 0519 04/13/23 0645  HGB 10.7*  --   --  10.4*  HCT 33.4*  --   --  32.1*  PLT 221  --   --  208  LABPROT 38.8* 35.5* 37.2*  --   INR 3.9* 3.5* 3.7*  --   CREATININE 17.18*  --   --  16.04*   Estimated Creatinine Clearance: 7.7 mL/min (A) (by C-G formula based on SCr of 16.04 mg/dL (H)).  Medical History: Past Medical History:  Diagnosis Date   ESRD on hemodialysis (HCC)    Hypertension    Lupus    Seizures (HCC)    Seizures (HCC)    Stroke (HCC)    28 or 42 years of age.     Wears glasses    Assessment: 42 yo M presented with seizures with HD. Patient has history antiphospholipid syndrome, leading to hypercoagulable state and causing hx of strokes. INR on admission 1.1 - most likely not taking Warfarin at home. Warfarin held and sq heparin initiated in interim. MRI showing multiple acute and subacute infarcts. Per MD, discussed with neurology and would like to start warfarin and use heparin bridge until therapeutic.  INR 3.7 today is supratherapeutic after receiving warfarin 7.5 mg yesterday (INR was 3.5 before 7.5 mg dose). Patient is eating 100% of meals, no new DDIs noted. CBC is stable.   Goal of Therapy:  INR 2.5-3.5 per neurology  Monitor platelets by anticoagulation protocol: Yes   Plan:  Give warfarin 5 mg PO x1 dose Check INR daily while on warfarin Continue to monitor H&H and  platelets  Thank you for allowing pharmacy to be a part of this patient's care.   Romie Minus, PharmD PGY1 Pharmacy Resident  Please check AMION for all [facility] Pharmacy phone numbers After 10:00 PM, call Main Pharmacy [phone]

## 2023-04-13 NOTE — Plan of Care (Signed)

## 2023-04-13 NOTE — Progress Notes (Signed)
PROGRESS NOTE    [name] [name]  DGU:[id number] DOB: January 18, 1981 DOA: 03/27/2023 PCP: Claiborne Rigg, NP   Brief Narrative:  This 42 years old male with PMH significant for essential hypertension, ESRD on hemodialysis, SLE with APLA previously on Eliquis, CVA, chronic thrombocytopenia, anemia of chronic disease, history of seizures, history of medication noncompliance presented to the ED from dialysis for not acting himself during dialysis.  Patient was found to have active seizures in the ER with tongue biting and was hypoxic with SpO2 of 60% on room air which improved to 100% on nonrebreather and was treated with IV Ativan.  Patient remained postictal in the ER occasionally agitated.  He was loaded with Keppra.  Neurology was consulted.  CT head without acute bleed but possibly shows new stroke.  Patient had multiple ER visits for breakthrough seizures.  MRI showed acute infarct of the left PCA territory, no hemorrhage or mass effect.  Patient was admitted in the ICU. TRH pickup 03/29/2023   Medically optimized and INR therapeutic at 3.0 on 10/22.  CIR declined.   TOC working up for SNF, has cognitive/insight issues and unsafe to discharge home by self.  DC pending bed availability.   Assessment & Plan:   Principal Problem:   Seizure [facility]) Active Problems:   Cerebrovascular accident (CVA) due to embolism of cerebral artery (HCC)  Seizure with postictal encephalopathy / History of seizures: History of complex partial seizures. Multiple ED visits for breakthrough seizures.  Questionable compliance-reportedly misses about 3-4 Doses of Keppra per week. Admitted for seizure and aspiration pneumonitis.  Received Ativan and Keppra load EEG showed moderate to diffuse encephalopathy, No seizures or epileptiform discharges. Continue Keppra 500 Mg twice daily. Seizure precautions.  No further seizures reported.   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 Prior left PCA  infarct in August 2024. Carotid Dopplers at that time unremarkable. Had been discharged on Eliquis and Lipitor. MRI showed multiple acute and subacute infarcts.  Per neurology, likely secondary to antiphospholipid syndrome on Coumadin with subtherapeutic INR and noncompliance. Noncompliant to warfarin, now on warfarin with heparin IV bridge. Compliance has been counseled this admission. Continue Olanzapine prn for agitation Due to somnolence on 10/16, obtained CT head and EEG without acute abnormalities.  Mental status has improved and stable since. He has cognitive impairment and poor insight into his overall medical condition and physical impairment, recommending CIR-but per CIR, not a candidate for their department.  TOC working on SNF.   Acute hypoxic respiratory failure: Likely multifactorial. Resolved.   ESRD on MWF HD/hyperkalemia Nephrology following for HD needs. Stable. HD as per schedule.   Metabolic bone disease Significant hyperphosphatemia >30.   Nephrology resuming binder and Sensipar.  Phosphorus 11.9.   Essential hypertension: Controlled on as needed hydralazine.   Hyperlipidemia: Continue Lipitor   Anemia in ESRD: Stable.   Thrombocytopenia Appears new compared to August 2024.  Resolved.   Acute encephalopathy: > Resolved. Noted on 10/16. CT head without acute findings.  EEG suggestive of moderate diffuse encephalopathy.  No seizures or epileptiform discharges. Alert and oriented.  No agitation.   Antiphospholipid Syndrome  SLE  Poor Med Adherence: Patient has tried multiple anticoagulants including warfarin, eliquis, xarelto, but struggles w/ med adherence.  Looks like he was most recently taking Warfarin, but INR on admission suggests pt was not taking regularly. Although warfarin is first line for antiphopsolipid syndrome, given pt has poor follow-up and poor med adherence, Eliquis may have been a better option.  However, as per neurology note, given  patient's ESRD on dialysis and antiphospholipid syndrome, Coumadin may be the best option and he may not have much choices. Completed IV heparin bridge.  Goal INR 2.5-3.5.  INR 3 on 10/22.  Off heparin bridge.   Body mass index is 30.05 kg/m.     DVT prophylaxis:  Coumadin Code Status: Full code Family Communication:No family at bed side Disposition Plan:    Status is: Inpatient Remains inpatient appropriate because:   From a medical standpoint, he is stable for DC to next level of care.  Extremely unsafe to return home immediately due to cognitive impairment, poor insight, lives alone, may not be able to manage his medications, HD needs.  Hopefully can go to SNF with improvement in all of these prior to returning home.  Declined by CIR.  Awaiting SNF.  Discussed with TOC 10/22.      Consultants:  Nephrology Neurology  Procedures:  Antimicrobials:  Anti-infectives (From admission, onward)    Start     Dose/Rate Route Frequency Ordered Stop   03/27/23 1545  cefTRIAXone (ROCEPHIN) 1 g in sodium chloride 0.9 % 100 mL IVPB        1 g 200 mL/hr over 30 Minutes Intravenous  Once 03/27/23 1544 03/27/23 1740   03/27/23 1545  azithromycin (ZITHROMAX) 500 mg in sodium chloride 0.9 % 250 mL IVPB  Status:  Discontinued        500 mg 250 mL/hr over 60 Minutes Intravenous  Once 03/27/23 1544 03/27/23 1727      Subjective: Patient was seen and examined at bedside. Overnight events noted.   Patient denies any concerns. He reports doing better, he was seen in the hemodialysis suite.  Objective: Vitals:   04/13/23 1044 04/13/23 1055 04/13/23 1101 04/13/23 1118  BP: 101/71 113/71  117/72  Pulse: 77 76  79  Resp: 16 10  18   Temp: 98.5 F (36.9 C)   98.7 F (37.1 C)  TempSrc: Oral   Oral  SpO2: 98% 97%  100%  Weight:   108.7 kg   Height:        Intake/Output Summary (Last 24 hours) at 04/13/2023 1208 Last data filed at 04/13/2023 1055 Gross per 24 hour  Intake 200 ml  Output  1.5 ml  Net 198.5 ml   Filed Weights   04/13/23 0507 04/13/23 0636 04/13/23 1101  Weight: 109.7 kg 110.2 kg 108.7 kg    Examination:  General exam: Appears comfortable, not in any acute distress. Deconditioned. Respiratory system: CTA bilaterally. Respiratory effort normal.  RR 14 Cardiovascular system: S1 & S2 heard, RRR. No JVD, murmurs, rubs, gallops or clicks. No pedal edema. Gastrointestinal system: Abdomen is non distended, soft and non tender.  Normal bowel sounds heard. Central nervous system: Alert and oriented x 3. No focal neurological deficits. Extremities: No edema, no cyanosis, no clubbing Skin: No rashes, lesions or ulcers Psychiatry: Judgement and insight appear normal. Mood & affect appropriate.     Data Reviewed: I have personally reviewed following labs and imaging studies  CBC: Recent Labs  Lab 04/08/23 0749 04/09/23 0442 04/10/23 0446 04/11/23 0639 04/13/23 0645  WBC 7.6 9.0 7.1 8.8 7.3  NEUTROABS  --   --   --  6.2  --   HGB 11.9* 10.7* 11.0* 10.7* 10.4*  HCT 36.6* 32.8* 33.9* 33.4* 32.1*  MCV 92.0 91.6 92.6 92.3 93.3  PLT 174 209 200 221 208   Basic Metabolic Panel: Recent Labs  Lab 04/08/23  1159 04/11/23 0639 04/13/23 0645  NA 132* 133* 137  K 5.2* 4.7 4.7  CL 91* 92* 98  CO2 23 21* 23  GLUCOSE 94 115* 87  BUN 82* 89* 85*  CREATININE 16.46* 17.18* 16.04*  CALCIUM 9.3 8.8* 8.4*  MG  --  2.5*  --   PHOS 9.1* 9.6* 8.5*   GFR: Estimated Creatinine Clearance: 7.7 mL/min (A) (by C-G formula based on SCr of 16.04 mg/dL (H)). Liver Function Tests: Recent Labs  Lab 04/08/23 1159 04/11/23 0639 04/13/23 0645  AST  --  21  --   ALT  --  20  --   ALKPHOS  --  64  --   BILITOT  --  0.5  --   PROT  --  7.5  --   ALBUMIN 3.4* 3.6 3.3*   No results for input(s): "LIPASE", "AMYLASE" in the last 168 hours. No results for input(s): "AMMONIA" in the last 168 hours. Coagulation Profile: Recent Labs  Lab 04/09/23 0442 04/10/23 0446  04/11/23 0639 04/12/23 1036 04/13/23 0519  INR 3.0* 3.2* 3.9* 3.5* 3.7*   Cardiac Enzymes: No results for input(s): "CKTOTAL", "CKMB", "CKMBINDEX", "TROPONINI" in the last 168 hours. BNP (last 3 results) No results for input(s): "PROBNP" in the last 8760 hours. HbA1C: No results for input(s): "HGBA1C" in the last 72 hours. CBG: Recent Labs  Lab 04/10/23 1641 04/10/23 1943 04/10/23 2332 04/11/23 0340 04/13/23 1129  GLUCAP 80 122* 104* 87 104*   Lipid Profile: No results for input(s): "CHOL", "HDL", "LDLCALC", "TRIG", "CHOLHDL", "LDLDIRECT" in the last 72 hours. Thyroid Function Tests: No results for input(s): "TSH", "T4TOTAL", "FREET4", "T3FREE", "THYROIDAB" in the last 72 hours. Anemia Panel: No results for input(s): "VITAMINB12", "FOLATE", "FERRITIN", "TIBC", "IRON", "RETICCTPCT" in the last 72 hours. Sepsis Labs: No results for input(s): "PROCALCITON", "LATICACIDVEN" in the last 168 hours.  No results found for this or any previous visit (from the past 240 hour(s)).   Radiology Studies: No results found.  Scheduled Meds:  atorvastatin  40 mg Oral Daily   Chlorhexidine Gluconate Cloth  6 each Topical Q0600   cinacalcet  60 mg Oral Q supper   levETIRAcetam  500 mg Oral BID   sevelamer carbonate  3,200 mg Oral TID WC   warfarin  5 mg Oral ONCE-1600   Warfarin - Pharmacist Dosing Inpatient   Does not apply q1600   Continuous Infusions:   LOS: 17 days    Time spent: 35 mins    Willeen Niece, MD [facility]   If 7PM-7AM, please contact night-coverage

## 2023-04-13 NOTE — Progress Notes (Signed)
[facility] NEPHROLOGY PROGRESS NOTE  Assessment/ Plan: Seizures - acute on chronic, takes Keppra at home but misses doses. MRI with scattered infarcts - neurology implicates subtherapeutic INR.   H/o antiphospholipid syndrome - hx SLE, has been tried on multiple anticoagulants in the past but struggles w/ medication adherence.  On coumadin   ESRD - on HD MWF.  Next dialysis today  HTN/volume - UF with HD as tolerated, continue fluid restriction.  Anemia of ESRD - Hb 10.4-stable, no esa needs.   CKD-MBD - CCa normalized, and phos is very high. Resumed binder and sensipar. Renal diet. Po4 down to 9.1  Dispo: pending SNF . Marland Kitchen OP HD: [facility] MWF  4h   400/800   101.5kg   2/2 bath   [facility] TDC    Heparin none - last OP HD today 10/9, post wt 110.5kg (9kg over) - mircera 50 mcg IV q 2wks, last 10/2, due 10/16  - venofer 100mg  three times per week thru today - rocaltrol 2.0 mcg  . Marland Kitchen [name], MD  [facility]   Subjective: Seen and examined on dialysis. HD delayed to today due to staffing/inpatient census. Toelrating treatment,   Objective Vital signs in last 24 hours: Vitals:   04/13/23 0930 04/13/23 1000 04/13/23 1030 04/13/23 1044  BP: 110/63 95/68 108/66 101/71  Pulse: 84 83 78 77  Resp: 11 12 12 16   Temp:      TempSrc:      SpO2: 100% 99% 98% 98%  Weight:      Height:       Weight change: 0.6 kg  Intake/Output Summary (Last 24 hours) at 04/13/2023 1045 Last data filed at 04/12/2023 1600 Gross per 24 hour  Intake 450 ml  Output --  Net 450 ml        Labs: RENAL PANEL Recent Labs  Lab 04/08/23 1159 04/11/23 0639 04/13/23 0645  NA 132* 133* 137  K 5.2* 4.7 4.7  CL 91* 92* 98  CO2 23 21* 23  GLUCOSE 94 115* 87  BUN 82* 89* 85*  CREATININE 16.46* 17.18* 16.04*  CALCIUM 9.3 8.8* 8.4*  MG  --  2.5*  --   PHOS 9.1* 9.6* 8.5*  ALBUMIN 3.4* 3.6 3.3*    Liver Function Tests: Recent Labs  Lab 04/08/23 1159 04/11/23 0639  04/13/23 0645  AST  --  21  --   ALT  --  20  --   ALKPHOS  --  64  --   BILITOT  --  0.5  --   PROT  --  7.5  --   ALBUMIN 3.4* 3.6 3.3*   No results for input(s): "LIPASE", "AMYLASE" in the last 168 hours. No results for input(s): "AMMONIA" in the last 168 hours. CBC: Recent Labs    04/08/23 0749 04/09/23 0442 04/10/23 0446 04/11/23 0639 04/13/23 0645  HGB 11.9* 10.7* 11.0* 10.7* 10.4*  MCV 92.0 91.6 92.6 92.3 93.3    Cardiac Enzymes: No results for input(s): "CKTOTAL", "CKMB", "CKMBINDEX", "TROPONINI" in the last 168 hours. CBG: Recent Labs  Lab 04/10/23 1149 04/10/23 1641 04/10/23 1943 04/10/23 2332 04/11/23 0340  GLUCAP 89 80 122* 104* 87    Iron Studies: No results for input(s): "IRON", "TIBC", "TRANSFERRIN", "FERRITIN" in the last 72 hours. Studies/Results: No results found.  Medications: Infusions:    Scheduled Medications:  atorvastatin  40 mg Oral Daily   Chlorhexidine Gluconate Cloth  6 each Topical Q0600   cinacalcet  60 mg Oral Q  supper   heparin sodium (porcine)  3,200 Units Intracatheter Once   levETIRAcetam  500 mg Oral BID   sevelamer carbonate  3,200 mg Oral TID WC   Warfarin - Pharmacist Dosing Inpatient   Does not apply q1600    have reviewed scheduled and prn medications.  Physical Exam: General:NAD Heart:RRR, s1s2 Lungs: cta bl, normal wob Abdomen:soft, Non-tender, non-distended Extremities:No edema Dialysis Access: Right IJ TDC, nonfunctional lue avf  Lonna Rabold 04/13/2023,10:45 AM  LOS: 17 days

## 2023-04-13 NOTE — Plan of Care (Signed)
Problem: Education: Goal: Knowledge of disease or condition will improve 04/13/2023 1235 by Elba Barman, RN Outcome: Progressing 04/13/2023 1209 by Elba Barman, RN Outcome: Progressing Goal: Knowledge of secondary prevention will improve (MUST DOCUMENT ALL) 04/13/2023 1235 by Elba Barman, RN Outcome: Progressing 04/13/2023 1209 by Elba Barman, RN Outcome: Progressing Goal: Knowledge of patient specific risk factors will improve Loraine Leriche N/A or DELETE if not current risk factor) 04/13/2023 1235 by Elba Barman, RN Outcome: Progressing 04/13/2023 1209 by Elba Barman, RN Outcome: Progressing   Problem: Ischemic Stroke/TIA Tissue Perfusion: Goal: Complications of ischemic stroke/TIA will be minimized 04/13/2023 1235 by Elba Barman, RN Outcome: Progressing 04/13/2023 1209 by Elba Barman, RN Outcome: Progressing   Problem: Coping: Goal: Will verbalize positive feelings about self 04/13/2023 1235 by Elba Barman, RN Outcome: Progressing 04/13/2023 1209 by Elba Barman, RN Outcome: Progressing Goal: Will identify appropriate support needs 04/13/2023 1235 by Elba Barman, RN Outcome: Progressing 04/13/2023 1209 by Elba Barman, RN Outcome: Progressing   Problem: Health Behavior/Discharge Planning: Goal: Ability to manage health-related needs will improve 04/13/2023 1235 by Elba Barman, RN Outcome: Progressing 04/13/2023 1209 by Elba Barman, RN Outcome: Progressing Goal: Goals will be collaboratively established with patient/family 04/13/2023 1235 by Elba Barman, RN Outcome: Progressing 04/13/2023 1209 by Elba Barman, RN Outcome: Progressing   Problem: Self-Care: Goal: Ability to participate in self-care as condition permits will improve 04/13/2023 1235 by Elba Barman, RN Outcome: Progressing 04/13/2023 1209 by Elba Barman, RN Outcome: Progressing Goal: Verbalization of feelings and concerns over difficulty with self-care  will improve 04/13/2023 1235 by Elba Barman, RN Outcome: Progressing 04/13/2023 1209 by Elba Barman, RN Outcome: Progressing Goal: Ability to communicate needs accurately will improve 04/13/2023 1235 by Elba Barman, RN Outcome: Progressing 04/13/2023 1209 by Elba Barman, RN Outcome: Progressing   Problem: Nutrition: Goal: Risk of aspiration will decrease 04/13/2023 1235 by Elba Barman, RN Outcome: Progressing 04/13/2023 1209 by Elba Barman, RN Outcome: Progressing Goal: Dietary intake will improve 04/13/2023 1235 by Elba Barman, RN Outcome: Progressing 04/13/2023 1209 by Elba Barman, RN Outcome: Progressing   Problem: Education: Goal: Knowledge of General Education information will improve Description: Including pain rating scale, medication(s)/side effects and non-pharmacologic comfort measures 04/13/2023 1235 by Elba Barman, RN Outcome: Progressing 04/13/2023 1209 by Elba Barman, RN Outcome: Progressing   Problem: Health Behavior/Discharge Planning: Goal: Ability to manage health-related needs will improve 04/13/2023 1235 by Elba Barman, RN Outcome: Progressing 04/13/2023 1209 by Elba Barman, RN Outcome: Progressing   Problem: Clinical Measurements: Goal: Ability to maintain clinical measurements within normal limits will improve 04/13/2023 1235 by Elba Barman, RN Outcome: Progressing 04/13/2023 1209 by Elba Barman, RN Outcome: Progressing Goal: Will remain free from infection 04/13/2023 1235 by Elba Barman, RN Outcome: Progressing 04/13/2023 1209 by Elba Barman, RN Outcome: Progressing Goal: Diagnostic test results will improve 04/13/2023 1235 by Elba Barman, RN Outcome: Progressing 04/13/2023 1209 by Elba Barman, RN Outcome: Progressing Goal: Respiratory complications will improve 04/13/2023 1235 by Elba Barman, RN Outcome: Progressing 04/13/2023 1209 by Elba Barman, RN Outcome:  Progressing Goal: Cardiovascular complication will be avoided 04/13/2023 1235 by Elba Barman, RN Outcome: Progressing 04/13/2023 1209 by Elba Barman, RN Outcome: Progressing   Problem: Activity: Goal: Risk for activity intolerance will decrease 04/13/2023 1235 by Elba Barman, RN Outcome: Progressing  04/13/2023 1209 by Elba Barman, RN Outcome: Progressing   Problem: Nutrition: Goal: Adequate nutrition will be maintained 04/13/2023 1235 by Elba Barman, RN Outcome: Progressing 04/13/2023 1209 by Elba Barman, RN Outcome: Progressing   Problem: Coping: Goal: Level of anxiety will decrease 04/13/2023 1235 by Elba Barman, RN Outcome: Progressing 04/13/2023 1209 by Elba Barman, RN Outcome: Progressing   Problem: Elimination: Goal: Will not experience complications related to bowel motility 04/13/2023 1235 by Elba Barman, RN Outcome: Progressing 04/13/2023 1209 by Elba Barman, RN Outcome: Progressing Goal: Will not experience complications related to urinary retention 04/13/2023 1235 by Elba Barman, RN Outcome: Progressing 04/13/2023 1209 by Elba Barman, RN Outcome: Progressing   Problem: Pain Managment: Goal: General experience of comfort will improve 04/13/2023 1235 by Elba Barman, RN Outcome: Progressing 04/13/2023 1209 by Elba Barman, RN Outcome: Progressing   Problem: Safety: Goal: Ability to remain free from injury will improve 04/13/2023 1235 by Elba Barman, RN Outcome: Progressing 04/13/2023 1209 by Elba Barman, RN Outcome: Progressing   Problem: Skin Integrity: Goal: Risk for impaired skin integrity will decrease 04/13/2023 1235 by Elba Barman, RN Outcome: Progressing 04/13/2023 1209 by Elba Barman, RN Outcome: Progressing

## 2023-04-14 DIAGNOSIS — R569 Unspecified convulsions: Secondary | ICD-10-CM | POA: Diagnosis not present

## 2023-04-14 LAB — PROTIME-INR
INR: 3.6 — ABNORMAL HIGH (ref 0.8–1.2)
Prothrombin Time: 36 s — ABNORMAL HIGH (ref 11.4–15.2)

## 2023-04-14 MED ORDER — WARFARIN SODIUM 5 MG PO TABS
5.0000 mg | ORAL_TABLET | Freq: Once | ORAL | Status: AC
  Administered 2023-04-14: 5 mg via ORAL
  Filled 2023-04-14: qty 1

## 2023-04-14 NOTE — Progress Notes (Signed)
PROGRESS NOTE    [name] [name]  GNF:[id number] DOB: Nov 07, 1980 DOA: 03/27/2023 PCP: Claiborne Rigg, NP   Brief Narrative:  This 42 years old male with PMH significant for essential hypertension, ESRD on hemodialysis, SLE with APLA previously on Eliquis, CVA, chronic thrombocytopenia, anemia of chronic disease, history of seizures, history of medication noncompliance presented to the ED from dialysis for not acting himself during dialysis.  Patient was found to have active seizures in the ER with tongue biting and was hypoxic with SpO2 of 60% on room air which improved to 100% on nonrebreather and was treated with IV Ativan.  Patient remained postictal in the ER occasionally agitated.  He was loaded with Keppra.  Neurology was consulted.  CT head without acute bleed but possibly shows new stroke.  Patient had multiple ER visits for breakthrough seizures.  MRI showed acute infarct of the left PCA territory, no hemorrhage or mass effect.  Patient was admitted in the ICU. TRH pickup 03/29/2023   Medically optimized and INR therapeutic at 3.0 on 10/22.  CIR declined.   TOC working up for SNF, has cognitive/insight issues and unsafe to discharge home by self.  DC pending bed availability.   Assessment & Plan:   Principal Problem:   Seizure [facility]) Active Problems:   Cerebrovascular accident (CVA) due to embolism of cerebral artery (HCC)  Seizure with postictal encephalopathy / History of seizures: History of complex partial seizures. Multiple ED visits for breakthrough seizures. Questionable compliance-reportedly misses about 3-4 Doses of Keppra per week. Admitted for seizure and aspiration pneumonitis.  Received Ativan and Keppra load EEG showed moderate to diffuse encephalopathy, No seizures or epileptiform discharges. Continue Keppra 500 Mg twice daily. Seizure precautions. No further seizures reported.   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 Prior left PCA  infarct in August 2024. Carotid Dopplers at that time unremarkable. Had been discharged on Eliquis and Lipitor. MRI showed multiple acute and subacute infarcts.  Per neurology, likely secondary to antiphospholipid syndrome on Coumadin with subtherapeutic INR and noncompliance. Noncompliant to warfarin, now on warfarin with heparin IV bridge. Compliance has been counseled this admission. Continue Olanzapine prn for agitation Due to somnolence on 10/16, obtained CT head and EEG without acute abnormalities.  Mental status has improved and stable since. He has cognitive impairment and poor insight into his overall medical condition and physical impairment, recommending CIR-but per CIR, not a candidate for their department.  TOC working on SNF.   Acute hypoxic respiratory failure: Likely multifactorial. Resolved.   ESRD on MWF HD/hyperkalemia Nephrology following for HD needs. Stable. HD as per schedule.   Metabolic bone disease Significant hyperphosphatemia >30.   Nephrology resuming binder and Sensipar.  Phosphorus 11.9.   Essential hypertension: Controlled on as needed hydralazine.   Hyperlipidemia: Continue Lipitor   Anemia in ESRD: Stable.   Thrombocytopenia Appears new compared to August 2024.  Resolved.   Acute encephalopathy: > Resolved. Noted on 10/16. CT head without acute findings.  EEG suggestive of moderate diffuse encephalopathy.  No seizures or epileptiform discharges. Alert and oriented.  No agitation.   Antiphospholipid Syndrome  SLE  Poor Med Adherence: Patient has tried multiple anticoagulants including warfarin, eliquis, xarelto, but struggles w/ med adherence.  Looks like he was most recently taking Warfarin, but INR on admission suggests pt was not taking regularly. Although warfarin is first line for antiphopsolipid syndrome, given pt has poor follow-up and poor med adherence, Eliquis may have been a better option.  However,  as per neurology note, given  patient's ESRD on dialysis and antiphospholipid syndrome, Coumadin may be the best option and he may not have much choices. Completed IV heparin bridge.  Goal INR 2.5-3.5.  INR 3 on 10/22.  Off heparin bridge.   Body mass index is 30.05 kg/m.     DVT prophylaxis:  Coumadin Code Status: Full code Family Communication:No family at bed side Disposition Plan:    Status is: Inpatient Remains inpatient appropriate because:   From a medical standpoint, he is stable for DC to next level of care.  Extremely unsafe to return home immediately due to cognitive impairment, poor insight, lives alone, may not be able to manage his medications, HD needs.  Hopefully can go to SNF with improvement in all of these prior to returning home.  Declined by CIR.  Awaiting SNF.  Discussed with TOC 10/22.      Consultants:  Nephrology Neurology  Procedures:  Antimicrobials:  Anti-infectives (From admission, onward)    Start     Dose/Rate Route Frequency Ordered Stop   03/27/23 1545  cefTRIAXone (ROCEPHIN) 1 g in sodium chloride 0.9 % 100 mL IVPB        1 g 200 mL/hr over 30 Minutes Intravenous  Once 03/27/23 1544 03/27/23 1740   03/27/23 1545  azithromycin (ZITHROMAX) 500 mg in sodium chloride 0.9 % 250 mL IVPB  Status:  Discontinued        500 mg 250 mL/hr over 60 Minutes Intravenous  Once 03/27/23 1544 03/27/23 1727      Subjective: Patient was seen and examined at bedside. Overnight events noted.   Patient lying comfortably in the bed,  reports doing better, denies any other concerns.  Objective: Vitals:   04/13/23 1927 04/13/23 2308 04/14/23 0408 04/14/23 0752  BP: 120/85 121/79 (!) 135/92 (!) 126/96  Pulse: 77 81 67 81  Resp: 16 14 18 20   Temp: 98.2 F (36.8 C) 97.8 F (36.6 C) 98.5 F (36.9 C) 98.7 F (37.1 C)  TempSrc: Oral Oral Oral Oral  SpO2: 96% 97% 97% 100%  Weight:   107.7 kg   Height:       No intake or output data in the 24 hours ending 04/14/23 1119  Filed Weights    04/13/23 0636 04/13/23 1101 04/14/23 0408  Weight: 110.2 kg 108.7 kg 107.7 kg    Examination:  General exam: Appears Comfortable, not in any acute distress.  Deconditioned. Respiratory system: CTA bilaterally. Respiratory effort normal.  RR 14 Cardiovascular system: S1 & S2 heard, RRR. No JVD, murmurs, rubs, gallops or clicks. No pedal edema. Gastrointestinal system: Abdomen is non distended, soft and non tender.  Normal bowel sounds heard. Central nervous system: Alert and oriented x 3. No focal neurological deficits. Extremities: No edema, no cyanosis, no clubbing Skin: No rashes, lesions or ulcers Psychiatry: Judgement and insight appear normal. Mood & affect appropriate.     Data Reviewed: I have personally reviewed following labs and imaging studies  CBC: Recent Labs  Lab 04/08/23 0749 04/09/23 0442 04/10/23 0446 04/11/23 0639 04/13/23 0645  WBC 7.6 9.0 7.1 8.8 7.3  NEUTROABS  --   --   --  6.2  --   HGB 11.9* 10.7* 11.0* 10.7* 10.4*  HCT 36.6* 32.8* 33.9* 33.4* 32.1*  MCV 92.0 91.6 92.6 92.3 93.3  PLT 174 209 200 221 208   Basic Metabolic Panel: Recent Labs  Lab 04/08/23 1159 04/11/23 0639 04/13/23 0645  NA 132* 133* 137  K 5.2*  4.7 4.7  CL 91* 92* 98  CO2 23 21* 23  GLUCOSE 94 115* 87  BUN 82* 89* 85*  CREATININE 16.46* 17.18* 16.04*  CALCIUM 9.3 8.8* 8.4*  MG  --  2.5*  --   PHOS 9.1* 9.6* 8.5*   GFR: Estimated Creatinine Clearance: 7.6 mL/min (A) (by C-G formula based on SCr of 16.04 mg/dL (H)). Liver Function Tests: Recent Labs  Lab 04/08/23 1159 04/11/23 0639 04/13/23 0645  AST  --  21  --   ALT  --  20  --   ALKPHOS  --  64  --   BILITOT  --  0.5  --   PROT  --  7.5  --   ALBUMIN 3.4* 3.6 3.3*   No results for input(s): "LIPASE", "AMYLASE" in the last 168 hours. No results for input(s): "AMMONIA" in the last 168 hours. Coagulation Profile: Recent Labs  Lab 04/10/23 0446 04/11/23 0639 04/12/23 1036 04/13/23 0519 04/14/23 0554  INR  3.2* 3.9* 3.5* 3.7* 3.6*   Cardiac Enzymes: No results for input(s): "CKTOTAL", "CKMB", "CKMBINDEX", "TROPONINI" in the last 168 hours. BNP (last 3 results) No results for input(s): "PROBNP" in the last 8760 hours. HbA1C: No results for input(s): "HGBA1C" in the last 72 hours. CBG: Recent Labs  Lab 04/10/23 1641 04/10/23 1943 04/10/23 2332 04/11/23 0340 04/13/23 1129  GLUCAP 80 122* 104* 87 104*   Lipid Profile: No results for input(s): "CHOL", "HDL", "LDLCALC", "TRIG", "CHOLHDL", "LDLDIRECT" in the last 72 hours. Thyroid Function Tests: No results for input(s): "TSH", "T4TOTAL", "FREET4", "T3FREE", "THYROIDAB" in the last 72 hours. Anemia Panel: No results for input(s): "VITAMINB12", "FOLATE", "FERRITIN", "TIBC", "IRON", "RETICCTPCT" in the last 72 hours. Sepsis Labs: No results for input(s): "PROCALCITON", "LATICACIDVEN" in the last 168 hours.  No results found for this or any previous visit (from the past 240 hour(s)).   Radiology Studies: No results found.  Scheduled Meds:  atorvastatin  40 mg Oral Daily   Chlorhexidine Gluconate Cloth  6 each Topical Q0600   cinacalcet  60 mg Oral Q supper   levETIRAcetam  500 mg Oral BID   sevelamer carbonate  3,200 mg Oral TID WC   warfarin  5 mg Oral ONCE-1600   Warfarin - Pharmacist Dosing Inpatient   Does not apply q1600   Continuous Infusions:   LOS: 18 days    Time spent: 35 mins    Willeen Niece, MD [facility]   If 7PM-7AM, please contact night-coverage

## 2023-04-14 NOTE — Progress Notes (Signed)
PHARMACY - ANTICOAGULATION CONSULT NOTE  Pharmacy Consult for warfarin Indication:  APLA syndrome  Allergies  Allergen Reactions   Zyrtec [Cetirizine] Swelling    FACIAL SWELLING, NOT CATEGORIZED   Cetirizine & Related Swelling    Patient Measurements: Height: 6\' 1"  (185.4 cm) Weight: 107.7 kg (237 lb 7 oz) IBW/kg (Calculated) : 79.9 Heparin Dosing Weight: 103 kg   Vital Signs: Temp: 98.7 F (37.1 C) (10/27 0752) Temp Source: Oral (10/27 0752) BP: 126/96 (10/27 0752) Pulse Rate: 81 (10/27 0752)  Labs: Recent Labs    04/12/23 1036 04/13/23 0519 04/13/23 0645 04/14/23 0554  HGB  --   --  10.4*  --   HCT  --   --  32.1*  --   PLT  --   --  208  --   LABPROT 35.5* 37.2*  --  36.0*  INR 3.5* 3.7*  --  3.6*  CREATININE  --   --  16.04*  --    Estimated Creatinine Clearance: 7.6 mL/min (A) (by C-G formula based on SCr of 16.04 mg/dL (H)).  Medical History: Past Medical History:  Diagnosis Date   ESRD on hemodialysis (HCC)    Hypertension    Lupus    Seizures (HCC)    Seizures (HCC)    Stroke (HCC)    73 or 42 years of age.     Wears glasses    Assessment: 42 yo M presented with seizures with HD. Patient has history antiphospholipid syndrome, leading to hypercoagulable state and causing hx of strokes. INR on admission 1.1 - most likely not taking Warfarin at home. Warfarin held and sq heparin initiated in interim. MRI showing multiple acute and subacute infarcts. Per MD, discussed with neurology and would like to start warfarin and use heparin bridge until therapeutic.  INR 3.6 today is slightly supratherapeutic after receiving warfarin 5 mg yesterday (INR was 3.7 before 5 mg dose). Patient is eating 100% of meals per chart, no new DDIs noted. CBC is stable.   Goal of Therapy:  INR 2.5-3.5 per neurology  Monitor platelets by anticoagulation protocol: Yes   Plan:  Give warfarin 5 mg PO x1 dose Check INR daily while on warfarin Continue to monitor H&H and  platelets  Thank you for allowing pharmacy to be a part of this patient's care.   Romie Minus, PharmD PGY1 Pharmacy Resident  Please check AMION for all [facility] Pharmacy phone numbers After 10:00 PM, call Main Pharmacy [phone]

## 2023-04-14 NOTE — Progress Notes (Signed)
[facility] NEPHROLOGY PROGRESS NOTE  Assessment/ Plan: Seizures - acute on chronic, takes Keppra at home but misses doses. MRI with scattered infarcts - neurology implicates subtherapeutic INR.   H/o antiphospholipid syndrome - hx SLE, has been tried on multiple anticoagulants in the past but struggles w/ medication adherence.  On coumadin   ESRD - on HD MWF.  Next dialysis 10/28, in recliner if possible  HTN/volume - UF with HD as tolerated, continue fluid restriction.  Anemia of ESRD - Hb 10.4-stable, no esa needs.   CKD-MBD - CCa normalized, and phos is very high. Resumed binder and sensipar. Renal diet. Po4 down to 8.5  Dispo: pending SNF . Marland Kitchen OP HD: [facility] MWF  4h   400/800   101.5kg   2/2 bath   [facility] TDC    Heparin none - last OP HD today 10/9, post wt 110.5kg (9kg over) - mircera 50 mcg IV q 2wks, last 10/2, due 10/16  - venofer 100mg  three times per week thru today - rocaltrol 2.0 mcg  . Marland Kitchen [name], MD [facility]   Subjective: Seen and examined bedside. No complaints today. Tolerated HD yesterday with net uf 1.5L  Objective Vital signs in last 24 hours: Vitals:   04/13/23 1927 04/13/23 2308 04/14/23 0408 04/14/23 0752  BP: 120/85 121/79 (!) 135/92 (!) 126/96  Pulse: 77 81 67 81  Resp: 16 14 18 20   Temp: 98.2 F (36.8 C) 97.8 F (36.6 C) 98.5 F (36.9 C) 98.7 F (37.1 C)  TempSrc: Oral Oral Oral Oral  SpO2: 96% 97% 97% 100%  Weight:   107.7 kg   Height:       Weight change: -1 kg  Intake/Output Summary (Last 24 hours) at 04/14/2023 0934 Last data filed at 04/13/2023 1055 Gross per 24 hour  Intake --  Output 1.5 ml  Net -1.5 ml        Labs: RENAL PANEL Recent Labs  Lab 04/08/23 1159 04/11/23 0639 04/13/23 0645  NA 132* 133* 137  K 5.2* 4.7 4.7  CL 91* 92* 98  CO2 23 21* 23  GLUCOSE 94 115* 87  BUN 82* 89* 85*  CREATININE 16.46* 17.18* 16.04*  CALCIUM 9.3 8.8* 8.4*  MG  --  2.5*  --   PHOS 9.1* 9.6*  8.5*  ALBUMIN 3.4* 3.6 3.3*    Liver Function Tests: Recent Labs  Lab 04/08/23 1159 04/11/23 0639 04/13/23 0645  AST  --  21  --   ALT  --  20  --   ALKPHOS  --  64  --   BILITOT  --  0.5  --   PROT  --  7.5  --   ALBUMIN 3.4* 3.6 3.3*   No results for input(s): "LIPASE", "AMYLASE" in the last 168 hours. No results for input(s): "AMMONIA" in the last 168 hours. CBC: Recent Labs    04/08/23 0749 04/09/23 0442 04/10/23 0446 04/11/23 0639 04/13/23 0645  HGB 11.9* 10.7* 11.0* 10.7* 10.4*  MCV 92.0 91.6 92.6 92.3 93.3    Cardiac Enzymes: No results for input(s): "CKTOTAL", "CKMB", "CKMBINDEX", "TROPONINI" in the last 168 hours. CBG: Recent Labs  Lab 04/10/23 1641 04/10/23 1943 04/10/23 2332 04/11/23 0340 04/13/23 1129  GLUCAP 80 122* 104* 87 104*    Iron Studies: No results for input(s): "IRON", "TIBC", "TRANSFERRIN", "FERRITIN" in the last 72 hours. Studies/Results: No results found.  Medications: Infusions:    Scheduled Medications:  atorvastatin  40 mg Oral Daily  Chlorhexidine Gluconate Cloth  6 each Topical Q0600   cinacalcet  60 mg Oral Q supper   levETIRAcetam  500 mg Oral BID   sevelamer carbonate  3,200 mg Oral TID WC   warfarin  5 mg Oral ONCE-1600   Warfarin - Pharmacist Dosing Inpatient   Does not apply q1600    have reviewed scheduled and prn medications.  Physical Exam: General:NAD, laying flat in bed Heart:RRR, s1s2 Lungs: cta bl, normal wob Abdomen:soft, Non-tender, non-distended Extremities:No edema Dialysis Access: Right IJ TDC, nonfunctional lue avf  [name] [name] 04/14/2023,9:34 AM  LOS: 18 days

## 2023-04-15 DIAGNOSIS — R569 Unspecified convulsions: Secondary | ICD-10-CM | POA: Diagnosis not present

## 2023-04-15 LAB — BASIC METABOLIC PANEL
Anion gap: 16 — ABNORMAL HIGH (ref 5–15)
BUN: 81 mg/dL — ABNORMAL HIGH (ref 6–20)
CO2: 23 mmol/L (ref 22–32)
Calcium: 8.9 mg/dL (ref 8.9–10.3)
Chloride: 96 mmol/L — ABNORMAL LOW (ref 98–111)
Creatinine, Ser: 14.57 mg/dL — ABNORMAL HIGH (ref 0.61–1.24)
GFR, Estimated: 4 mL/min — ABNORMAL LOW (ref >60)
Glucose, Bld: 84 mg/dL (ref 70–99)
Potassium: 4.2 mmol/L (ref 3.5–5.1)
Sodium: 135 mmol/L (ref 135–145)

## 2023-04-15 LAB — CBC
HCT: 31.9 % — ABNORMAL LOW (ref 39.0–52.0)
Hemoglobin: 10.2 g/dL — ABNORMAL LOW (ref 13.0–17.0)
MCH: 30.3 pg (ref 26.0–34.0)
MCHC: 32 g/dL (ref 30.0–36.0)
MCV: 94.7 fL (ref 80.0–100.0)
Platelets: 199 10*3/uL (ref 150–400)
RBC: 3.37 MIL/uL — ABNORMAL LOW (ref 4.22–5.81)
RDW: 14.1 % (ref 11.5–15.5)
WBC: 7.4 10*3/uL (ref 4.0–10.5)
nRBC: 0 % (ref 0.0–0.2)

## 2023-04-15 LAB — HEMOGLOBIN AND HEMATOCRIT, BLOOD
HCT: 32.4 % — ABNORMAL LOW (ref 39.0–52.0)
Hemoglobin: 10.4 g/dL — ABNORMAL LOW (ref 13.0–17.0)

## 2023-04-15 LAB — PROTIME-INR
INR: 3.8 — ABNORMAL HIGH (ref 0.8–1.2)
Prothrombin Time: 37.4 s — ABNORMAL HIGH (ref 11.4–15.2)

## 2023-04-15 MED ORDER — WARFARIN SODIUM 2.5 MG PO TABS
2.5000 mg | ORAL_TABLET | Freq: Once | ORAL | Status: AC
  Administered 2023-04-15: 2.5 mg via ORAL
  Filled 2023-04-15: qty 1

## 2023-04-15 MED ORDER — HEPARIN SODIUM (PORCINE) 1000 UNIT/ML IJ SOLN
3200.0000 [IU] | Freq: Once | INTRAMUSCULAR | Status: AC
  Administered 2023-04-15: 3200 [IU]

## 2023-04-15 NOTE — Progress Notes (Signed)
PHARMACY - ANTICOAGULATION CONSULT NOTE  Pharmacy Consult for warfarin Indication:  APLA syndrome  Allergies  Allergen Reactions   Zyrtec [Cetirizine] Swelling    FACIAL SWELLING, NOT CATEGORIZED   Cetirizine & Related Swelling    Patient Measurements: Height: 6\' 1"  (185.4 cm) Weight: 108.3 kg (238 lb 12.1 oz) IBW/kg (Calculated) : 79.9 Heparin Dosing Weight: 103 kg   Vital Signs: Temp: 98.2 F (36.8 C) (10/28 1108) Temp Source: Oral (10/28 1108) BP: 117/92 (10/28 1108) Pulse Rate: 72 (10/28 1108)  Labs: Recent Labs    04/13/23 0519 04/13/23 0645 04/14/23 0554 04/15/23 0458  HGB  --  10.4*  --  10.4*  HCT  --  32.1*  --  32.4*  PLT  --  208  --   --   LABPROT 37.2*  --  36.0* 37.4*  INR 3.7*  --  3.6* 3.8*  CREATININE  --  16.04*  --  14.57*   Estimated Creatinine Clearance: 8.4 mL/min (A) (by C-G formula based on SCr of 14.57 mg/dL (H)).  Medical History: Past Medical History:  Diagnosis Date   ESRD on hemodialysis (HCC)    Hypertension    Lupus    Seizures (HCC)    Seizures (HCC)    Stroke (HCC)    4 or 42 years of age.     Wears glasses    Assessment: 42 yo M presented with seizures with HD. Patient has history antiphospholipid syndrome, leading to hypercoagulable state and causing hx of strokes. INR on admission 1.1 - most likely not taking Warfarin at home. Warfarin held and sq heparin initiated in interim. MRI showing multiple acute and subacute infarcts. Per MD, discussed with neurology and would like to start warfarin and use heparin bridge until therapeutic.  INR 3.8 up and slightly supratherapeutic. Patient is eating 100% of meals per chart, no new DDIs noted. CBC is stable.    Goal of Therapy:  INR 2.5-3.5 per neurology  Monitor platelets by anticoagulation protocol: Yes   Plan:  Give warfarin 2.5 mg PO x1 dose Check INR daily while on warfarin Continue to monitor H&H and platelets   Thank you for allowing pharmacy to be a part of this  patient's care.  Thelma Barge, PharmD Clinical Pharmacist

## 2023-04-15 NOTE — Progress Notes (Signed)
[facility] Progress Note   Subjective:    Seen and examined patient at bedside. He reports feeling fine today. Appears he was declined by CIR and awaiting SNF placement. Plan for HD today.  Objective Vitals:   04/14/23 1939 04/15/23 0404 04/15/23 0749 04/15/23 1108  BP: (!) 148/97 (!) 148/104 (!) 142/91 (!) 117/92  Pulse: 66 62 82 72  Resp: 18 16 18 17   Temp: 98 F (36.7 C) 98.1 F (36.7 C) 98 F (36.7 C) 98.2 F (36.8 C)  TempSrc: Oral Oral Oral Oral  SpO2: 96% 100% 97% 99%  Weight:  108.3 kg    Height:       Physical Exam General: Awake; Alert; NAD; on RA Heart:S1 and S2; MRGs Lungs:Clear throughout Abdomen:Soft and non-tender Extremities:No LE edema Dialysis Access: Right IJ TDC, nonfunctional lue avf    Filed Weights   04/13/23 1101 04/14/23 0408 04/15/23 0404  Weight: 108.7 kg 107.7 kg 108.3 kg   No intake or output data in the 24 hours ending 04/15/23 1228  Additional Objective Labs: Basic Metabolic Panel: Recent Labs  Lab 04/11/23 0639 04/13/23 0645 04/15/23 0458  NA 133* 137 135  K 4.7 4.7 4.2  CL 92* 98 96*  CO2 21* 23 23  GLUCOSE 115* 87 84  BUN 89* 85* 81*  CREATININE 17.18* 16.04* 14.57*  CALCIUM 8.8* 8.4* 8.9  PHOS 9.6* 8.5*  --    Liver Function Tests: Recent Labs  Lab 04/11/23 0639 04/13/23 0645  AST 21  --   ALT 20  --   ALKPHOS 64  --   BILITOT 0.5  --   PROT 7.5  --   ALBUMIN 3.6 3.3*   No results for input(s): "LIPASE", "AMYLASE" in the last 168 hours. CBC: Recent Labs  Lab 04/09/23 0442 04/10/23 0446 04/11/23 0639 04/13/23 0645 04/15/23 0458  WBC 9.0 7.1 8.8 7.3  --   NEUTROABS  --   --  6.2  --   --   HGB 10.7* 11.0* 10.7* 10.4* 10.4*  HCT 32.8* 33.9* 33.4* 32.1* 32.4*  MCV 91.6 92.6 92.3 93.3  --   PLT 209 200 221 208  --    Blood Culture    Component Value Date/Time   SDES BLOOD RIGHT HAND 01/19/2023 0213   SPECREQUEST  01/19/2023 7829    BOTTLES DRAWN AEROBIC AND ANAEROBIC Blood Culture  results may not be optimal due to an excessive volume of blood received in culture bottles   CULT  01/19/2023 0213    NO GROWTH 5 DAYS Performed at [facility], [address], [place], [place] [address]    REPTSTATUS 01/24/2023 FINAL 01/19/2023 0865    Cardiac Enzymes: No results for input(s): "CKTOTAL", "CKMB", "CKMBINDEX", "TROPONINI" in the last 168 hours. CBG: Recent Labs  Lab 04/10/23 1641 04/10/23 1943 04/10/23 2332 04/11/23 0340 04/13/23 1129  GLUCAP 80 122* 104* 87 104*   Iron Studies: No results for input(s): "IRON", "TIBC", "TRANSFERRIN", "FERRITIN" in the last 72 hours. Lab Results  Component Value Date   INR 3.8 (H) 04/15/2023   INR 3.6 (H) 04/14/2023   INR 3.7 (H) 04/13/2023   PROTIME 28.8 (H) 11/04/2013   PROTIME 21.6 (H) 10/14/2013   PROTIME 18.0 (H) 09/23/2013   Studies/Results: No results found.  Medications:   atorvastatin  40 mg Oral Daily   Chlorhexidine Gluconate Cloth  6 each Topical Q0600   cinacalcet  60 mg Oral Q supper   levETIRAcetam  500 mg Oral  BID   sevelamer carbonate  3,200 mg Oral TID WC   warfarin  2.5 mg Oral ONCE-1600   Warfarin - Pharmacist Dosing Inpatient   Does not apply q1600    Dialysis Orders: [facility] MWF  4h   400/800   101.5kg   2/2 bath   [facility] TDC    Heparin none - last OP HD today 10/9, post wt 110.5kg (9kg over) - mircera 50 mcg IV q 2wks, last 10/2, due 10/16  - venofer 100mg  three times per week thru today - rocaltrol 2.0 mcg   Assessment/Plan: Seizures - acute on chronic, takes Keppra at home but misses doses. MRI with scattered infarcts - neurology implicates subtherapeutic INR.    H/o antiphospholipid syndrome - hx SLE, has been tried on multiple anticoagulants in the past but struggles w/ medication adherence.  On coumadin    ESRD - on HD MWF.  Next dialysis 10/28, in recliner if possible   HTN/volume - UF with HD as tolerated, continue fluid restriction.   Anemia of ESRD - Hb 10.4-stable, no esa  needs.    CKD-MBD - CCa normalized, and phos is very high. Resumed binder and sensipar. Renal diet. Po4 down to 8.5   Dispo: pending SNF  Salome Holmes, NP [facility] 04/15/2023,12:28 PM  LOS: 19 days

## 2023-04-15 NOTE — Progress Notes (Signed)
PROGRESS NOTE    [name] [name]  KVQ:[id number] DOB: [birth year] DOA: 03/27/2023 PCP: Claiborne Rigg, NP   Brief Narrative:  This 42 years old male with PMH significant for essential hypertension, ESRD on hemodialysis, SLE with APLA previously on Eliquis, CVA, chronic thrombocytopenia, anemia of chronic disease, history of seizures, history of medication noncompliance presented to the ED from dialysis for not acting himself during dialysis.  Patient was found to have active seizures in the ER with tongue biting and was hypoxic with SpO2 of 60% on room air which improved to 100% on nonrebreather and was treated with IV Ativan.  Patient remained postictal in the ER occasionally agitated.  He was loaded with Keppra.  Neurology was consulted.  CT head without acute bleed but possibly shows new stroke.  Patient had multiple ER visits for breakthrough seizures.  MRI showed acute infarct of the left PCA territory, no hemorrhage or mass effect.  Patient was admitted in the ICU. TRH pickup 03/29/2023   Medically optimized and INR therapeutic at 3.0 on 10/22.  CIR declined.   TOC working up for SNF, has cognitive/insight issues and unsafe to discharge home by self.  DC pending bed availability.   Assessment & Plan:   Principal Problem:   Seizure [facility]) Active Problems:   Cerebrovascular accident (CVA) due to embolism of cerebral artery (HCC)  Seizure with postictal encephalopathy / History of seizures: History of complex partial seizures. Multiple ED visits for breakthrough seizures. Questionable compliance-reportedly misses about 3-4 Doses of Keppra per week. Admitted for seizure and aspiration pneumonitis.  Received Ativan and Keppra load EEG showed moderate to diffuse encephalopathy, No seizures or epileptiform discharges. Continue Keppra 500 Mg twice daily. Seizure precautions. No further seizures reported.   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 Prior left PCA  infarct in August 2024. Had been discharged on Eliquis and Lipitor. MRI showed multiple acute and subacute infarcts.  Per neurology, likely secondary to antiphospholipid syndrome on Coumadin with subtherapeutic INR and noncompliance. Noncompliant to warfarin, initiated on warfarin with heparin IV bridge. Compliance has been counseled this admission. Continue Olanzapine prn for agitation Due to somnolence on 10/16, obtained CT head and EEG without acute abnormalities.  Mental status has improved and stable since. He has cognitive impairment and poor insight into his overall medical condition and physical impairment, recommending CIR-but per CIR, not a candidate for their department.  TOC working on SNF.   Acute hypoxic respiratory failure: Likely multifactorial. Resolved.   ESRD on MWF HD/hyperkalemia Nephrology following for HD needs. Stable. HD as per schedule.   Metabolic bone disease Significant hyperphosphatemia >30.   Nephrology resuming binder and Sensipar.  Phosphorus 11.9.   Essential hypertension: Controlled on as needed hydralazine.   Hyperlipidemia: Continue Lipitor   Anemia in ESRD: Stable.   Thrombocytopenia Appears new compared to August 2024.  Resolved.   Acute encephalopathy: > Resolved. Noted on 10/16. CT head without acute findings.  EEG suggestive of moderate diffuse encephalopathy.  No seizures or epileptiform discharges. Alert and oriented.  No agitation.   Antiphospholipid Syndrome  SLE  Poor Med Adherence: Patient has tried multiple anticoagulants including warfarin, eliquis, xarelto, but struggles w/ med adherence.  Looks like he was most recently taking Warfarin, but INR on admission suggests pt was not taking regularly. Although warfarin is first line for antiphopsolipid syndrome, given pt has poor follow-up and poor med adherence, Eliquis may have been a better option.  However, as per neurology note, given patient's  ESRD on dialysis and  antiphospholipid syndrome, Coumadin may be the best option and he may not have much choices. Completed IV heparin bridge.  Goal INR 2.5-3.5.  INR 3 on 10/22.  Off heparin bridge.   Body mass index is 30.05 kg/m.     DVT prophylaxis:  Coumadin Code Status: Full code Family Communication:No family at bed side Disposition Plan:    Status is: Inpatient Remains inpatient appropriate because:   From a medical standpoint, he is stable for DC to next level of care.  Extremely unsafe to return home immediately due to cognitive impairment, poor insight, lives alone, may not be able to manage his medications, HD needs.  Hopefully can go to SNF with improvement in all of these prior to returning home.  Declined by CIR.  Awaiting SNF.  Discussed with TOC 10/22.      Consultants:  Nephrology Neurology  Procedures:  Antimicrobials:  Anti-infectives (From admission, onward)    Start     Dose/Rate Route Frequency Ordered Stop   03/27/23 1545  cefTRIAXone (ROCEPHIN) 1 g in sodium chloride 0.9 % 100 mL IVPB        1 g 200 mL/hr over 30 Minutes Intravenous  Once 03/27/23 1544 03/27/23 1740   03/27/23 1545  azithromycin (ZITHROMAX) 500 mg in sodium chloride 0.9 % 250 mL IVPB  Status:  Discontinued        500 mg 250 mL/hr over 60 Minutes Intravenous  Once 03/27/23 1544 03/27/23 1727      Subjective: Patient was seen and examined at bedside. Overnight events noted.   Patient was sitting comfortably in the chair. He reports doing better.  Denies any other concerns.  Objective: Vitals:   04/14/23 1939 04/15/23 0404 04/15/23 0749 04/15/23 1108  BP: (!) 148/97 (!) 148/104 (!) 142/91 (!) 117/92  Pulse: 66 62 82 72  Resp: 18 16 18 17   Temp: 98 F (36.7 C) 98.1 F (36.7 C) 98 F (36.7 C) 98.2 F (36.8 C)  TempSrc: Oral Oral Oral Oral  SpO2: 96% 100% 97% 99%  Weight:  108.3 kg    Height:       No intake or output data in the 24 hours ending 04/15/23 1228  Filed Weights   04/13/23 1101  04/14/23 0408 04/15/23 0404  Weight: 108.7 kg 107.7 kg 108.3 kg    Examination:  General exam: Appears comfortable, not in any acute distress.  Deconditioned. Respiratory system: CTA bilaterally. Respiratory effort normal.  RR 13 Cardiovascular system: S1 & S2 heard, RRR. No JVD, murmurs, rubs, gallops or clicks. No pedal edema. Gastrointestinal system: Abdomen is non distended, soft and non tender.  Normal bowel sounds heard. Central nervous system: Alert and oriented x 3. No focal neurological deficits. Extremities: No edema, no cyanosis, no clubbing Skin: No rashes, lesions or ulcers Psychiatry: Judgement and insight appear normal. Mood & affect appropriate.     Data Reviewed: I have personally reviewed following labs and imaging studies  CBC: Recent Labs  Lab 04/09/23 0442 04/10/23 0446 04/11/23 0639 04/13/23 0645 04/15/23 0458  WBC 9.0 7.1 8.8 7.3  --   NEUTROABS  --   --  6.2  --   --   HGB 10.7* 11.0* 10.7* 10.4* 10.4*  HCT 32.8* 33.9* 33.4* 32.1* 32.4*  MCV 91.6 92.6 92.3 93.3  --   PLT 209 200 221 208  --    Basic Metabolic Panel: Recent Labs  Lab 04/11/23 0639 04/13/23 0645 04/15/23 0458  NA 133* 137  135  K 4.7 4.7 4.2  CL 92* 98 96*  CO2 21* 23 23  GLUCOSE 115* 87 84  BUN 89* 85* 81*  CREATININE 17.18* 16.04* 14.57*  CALCIUM 8.8* 8.4* 8.9  MG 2.5*  --   --   PHOS 9.6* 8.5*  --    GFR: Estimated Creatinine Clearance: 8.4 mL/min (A) (by C-G formula based on SCr of 14.57 mg/dL (H)). Liver Function Tests: Recent Labs  Lab 04/11/23 0639 04/13/23 0645  AST 21  --   ALT 20  --   ALKPHOS 64  --   BILITOT 0.5  --   PROT 7.5  --   ALBUMIN 3.6 3.3*   No results for input(s): "LIPASE", "AMYLASE" in the last 168 hours. No results for input(s): "AMMONIA" in the last 168 hours. Coagulation Profile: Recent Labs  Lab 04/11/23 0639 04/12/23 1036 04/13/23 0519 04/14/23 0554 04/15/23 0458  INR 3.9* 3.5* 3.7* 3.6* 3.8*   Cardiac Enzymes: No results  for input(s): "CKTOTAL", "CKMB", "CKMBINDEX", "TROPONINI" in the last 168 hours. BNP (last 3 results) No results for input(s): "PROBNP" in the last 8760 hours. HbA1C: No results for input(s): "HGBA1C" in the last 72 hours. CBG: Recent Labs  Lab 04/10/23 1641 04/10/23 1943 04/10/23 2332 04/11/23 0340 04/13/23 1129  GLUCAP 80 122* 104* 87 104*   Lipid Profile: No results for input(s): "CHOL", "HDL", "LDLCALC", "TRIG", "CHOLHDL", "LDLDIRECT" in the last 72 hours. Thyroid Function Tests: No results for input(s): "TSH", "T4TOTAL", "FREET4", "T3FREE", "THYROIDAB" in the last 72 hours. Anemia Panel: No results for input(s): "VITAMINB12", "FOLATE", "FERRITIN", "TIBC", "IRON", "RETICCTPCT" in the last 72 hours. Sepsis Labs: No results for input(s): "PROCALCITON", "LATICACIDVEN" in the last 168 hours.  No results found for this or any previous visit (from the past 240 hour(s)).   Radiology Studies: No results found.  Scheduled Meds:  atorvastatin  40 mg Oral Daily   Chlorhexidine Gluconate Cloth  6 each Topical Q0600   cinacalcet  60 mg Oral Q supper   levETIRAcetam  500 mg Oral BID   sevelamer carbonate  3,200 mg Oral TID WC   warfarin  2.5 mg Oral ONCE-1600   Warfarin - Pharmacist Dosing Inpatient   Does not apply q1600   Continuous Infusions:   LOS: 19 days    Time spent: 35 mins    Willeen Niece, MD [facility]   If 7PM-7AM, please contact night-coverage
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
## 2023-04-15 NOTE — TOC Progression Note (Signed)
Transition of Care [facility]) - Progression Note    Patient Details  Name: [name] [name] MRN: [id number] Date of Birth: 27-Nov-1980  Transition of Care [facility]) CM/SW Contact  Erin Sons, [place] Phone Number: 04/15/2023, 4:25 PM  Clinical Narrative:     CSW  contacted pt's friend Crystal. Explained SNF bed options. She chooses [facility]. CSW followed up with [facility] and is informed they no longer have any beds available.   CSW  called and left message with Crystal with update and request for new SNF choice; awaiting response.   Expected Discharge Plan: Skilled Nursing Facility      Social Determinants of Health (SDOH) Interventions SDOH Screenings   Food Insecurity: No Food Insecurity (04/01/2023)  Housing: Low Risk  (04/01/2023)  Transportation Needs: No Transportation Needs (04/01/2023)  Utilities: Not At Risk (04/01/2023)  Alcohol Screen: Low Risk  (03/26/2023)  Depression (PHQ2-9): Low Risk  (03/26/2023)  Financial Resource Strain: Low Risk  (03/26/2023)  Physical Activity: Inactive (03/26/2023)  Social Connections: Moderately Isolated (03/26/2023)  Stress: No Stress Concern Present (03/26/2023)  Tobacco Use: Unknown (04/01/2023)  Health Literacy: Adequate Health Literacy (03/26/2023)    Readmission Risk Interventions    03/28/2023    2:57 PM 01/25/2023    3:40 PM  Readmission Risk Prevention Plan  Post Dischage Appt    Medication Screening    Transportation Screening Complete   Medication Review (RN Care Manager) Referral to Pharmacy   PCP or Specialist appointment within 3-5 days of discharge Complete   HRI or Home Care Consult Complete   SW Recovery Care/Counseling Consult Complete   Palliative Care Screening Not Applicable   Skilled Nursing Facility Not Applicable      Information is confidential and restricted. Go to Review Flowsheets to unlock data.

## 2023-04-15 NOTE — Progress Notes (Signed)
PT Cancellation Note  Patient Details Name: [name] [name] MRN: [id number] DOB: 1980/11/30   Cancelled Treatment:    Reason Eval/Treat Not Completed: Patient at procedure or test/unavailable  Taken off unit for dialysis this afternoon. Will attempt to follow-up later as schedule permits.   [name], PT, DPT [facility]  Rehabilitation Services Physical Therapist Office: [phone] Website: [email or web address]   [name] 04/15/2023, 1:50 PM

## 2023-04-15 NOTE — Progress Notes (Signed)
Received patient in bed to unit.  Alert and oriented.  Informed consent signed and in chart.   TX duration:3.5  Patient tolerated well.  Transported back to the room  Alert, without acute distress.  Hand-off given to patient's nurse.   Access used: right HD catheter Access issues: none  Total UF removed: 2L Medication(s) given: none   04/15/23 1720  Vitals  Temp 97.6 F (36.4 C)  Temp Source Oral  BP 121/84  MAP (mmHg) 96  BP Location Right Arm  BP Method Automatic  Patient Position (if appropriate) Sitting  Pulse Rate 62  Pulse Rate Source Monitor  ECG Heart Rate 61  Resp 12  Oxygen Therapy  SpO2 100 %  O2 Device Room Air  During Treatment Monitoring  Blood Flow Rate (mL/min) 400 mL/min  Arterial Pressure (mmHg) -215.95 mmHg  Venous Pressure (mmHg) 189.48 mmHg  TMP (mmHg) 2.22 mmHg  Ultrafiltration Rate (mL/min) 956 mL/min  Dialysate Flow Rate (mL/min) 300 ml/min  Dialysate Potassium Concentration 3  Dialysate Calcium Concentration 2.5  Duration of HD Treatment -hour(s) 3.48 hour(s)  Cumulative Fluid Removed (mL) per Treatment  1985.5  HD Safety Checks Performed Yes  Intra-Hemodialysis Comments Tx completed  Dialysis Fluid Bolus Normal Saline  Bolus Amount (mL) 300 mL      [name] [name] S [name]

## 2023-04-16 DIAGNOSIS — R569 Unspecified convulsions: Secondary | ICD-10-CM | POA: Diagnosis not present

## 2023-04-16 LAB — PROTIME-INR
INR: 3.3 — ABNORMAL HIGH (ref 0.8–1.2)
Prothrombin Time: 34.2 s — ABNORMAL HIGH (ref 11.4–15.2)

## 2023-04-16 MED ORDER — CHLORHEXIDINE GLUCONATE CLOTH 2 % EX PADS
6.0000 | MEDICATED_PAD | Freq: Every day | CUTANEOUS | Status: DC
  Administered 2023-04-17 – 2023-04-19 (×3): 6 via TOPICAL

## 2023-04-16 MED ORDER — WARFARIN SODIUM 2.5 MG PO TABS
2.5000 mg | ORAL_TABLET | Freq: Once | ORAL | Status: AC
  Administered 2023-04-16: 2.5 mg via ORAL
  Filled 2023-04-16: qty 1

## 2023-04-16 NOTE — Progress Notes (Signed)
Speech Language Pathology Treatment: Cognitive-Linquistic  Patient Details Name: [name] [name] MRN: [id number] DOB: 1981/03/07 Today's Date: 04/16/2023 Time: 4098-1191 SLP Time Calculation (min) (ACUTE ONLY): 13 min  Assessment / Plan / Recommendation Clinical Impression  Pt was sitting up in his chair for therapy session and more engaged in task compared to this SLP's last visit. He completed medication management task, needing Mod cues for selective attention, problem solving, and comprehension of instructions. After completing this task he did acknowledge that it was something he wanted to work on more. He will continue to benefit from SLP cognitive-linguistic therapy and assistance upon discharge.    HPI HPI: Pt is a 42 yo male presenting 10/9 from dialysis with AMS.  Patient was found to have active seizures in the ER with tongue biting and was hypoxic requiring NRB. MRI 10/12 showed scattered foci of acute and subacute infarction throughout both cerebral hemispheres, including the bilateral ACA, MCA and PCA territories. PMH includes: CVA (L PCA, August 2024; SLP eval at that time with severe cognitive impairment with concern for acute on chronic decline), HTN, ESRD on HD, SLE with APLA, chronic thrombocytopenia, anemia of chronic disease, history of seizures, history of medication noncompliance      SLP Plan  Continue with current plan of care      Recommendations for follow up therapy are one component of a multi-disciplinary discharge planning process, led by the attending physician.  Recommendations may be updated based on patient status, additional functional criteria and insurance authorization.    Recommendations                         Frequent or constant Supervision/Assistance Cognitive communication deficit (R41.841)     Continue with current plan of care     Mahala Menghini., M.A. CCC-SLP Acute Rehabilitation Services Office [phone]  Secure chat  preferred   04/16/2023, 3:30 PM

## 2023-04-16 NOTE — Plan of Care (Signed)
  Problem: Coping: Goal: Will verbalize positive feelings about self Outcome: Progressing   Problem: Self-Care: Goal: Ability to participate in self-care as condition permits will improve Outcome: Progressing   Problem: Nutrition: Goal: Risk of aspiration will decrease Outcome: Progressing

## 2023-04-16 NOTE — Plan of Care (Signed)

## 2023-04-16 NOTE — Progress Notes (Signed)
PHARMACY - ANTICOAGULATION CONSULT NOTE  Pharmacy Consult for warfarin Indication:  APLA syndrome  Allergies  Allergen Reactions   Zyrtec [Cetirizine] Swelling    FACIAL SWELLING, NOT CATEGORIZED   Cetirizine & Related Swelling    Patient Measurements: Height: 6\' 1"  (185.4 cm) Weight: 108.8 kg (239 lb 13.8 oz) IBW/kg (Calculated) : 79.9 Heparin Dosing Weight: 103 kg   Vital Signs: Temp: 98.6 F (37 C) (10/29 0314) Temp Source: Oral (10/29 0314) BP: 140/102 (10/29 0314) Pulse Rate: 78 (10/29 0314)  Labs: Recent Labs    04/14/23 0554 04/15/23 0458 04/15/23 1335 04/16/23 0524  HGB  --  10.4* 10.2*  --   HCT  --  32.4* 31.9*  --   PLT  --   --  199  --   LABPROT 36.0* 37.4*  --  34.2*  INR 3.6* 3.8*  --  3.3*  CREATININE  --  14.57*  --   --    Estimated Creatinine Clearance: 8.5 mL/min (A) (by C-G formula based on SCr of 14.57 mg/dL (H)).  Medical History: Past Medical History:  Diagnosis Date   ESRD on hemodialysis (HCC)    Hypertension    Lupus    Seizures (HCC)    Seizures (HCC)    Stroke (HCC)    [age] years of age.     Wears glasses    Assessment: 42 yo M presented with seizures with HD. Patient has history antiphospholipid syndrome, leading to hypercoagulable state and causing hx of strokes. INR on admission 1.1 - most likely not taking Warfarin at home. Warfarin held and sq heparin initiated in interim. MRI showing multiple acute and subacute infarcts. Per MD, discussed with neurology and would like to start warfarin and use heparin bridge until therapeutic.  10/29 AM: INR 3.3 - therapeutic. Patient is eating 100% of meals per chart, no new DDIs noted. CBC is stable. Patient has been averaging ~6 mg daily since he has been therapeutic on warfarin with intermittently supratherapeutic INRs.    Goal of Therapy:  INR 2.5-3.5 per neurology  Monitor platelets by anticoagulation protocol: Yes   Plan:  Give warfarin 2.5 mg PO x1 dose Check INR daily  while on warfarin Continue to monitor H&H and platelets   Thank you for allowing pharmacy to be a part of this patient's care.  Jani Gravel, PharmD Clinical Pharmacist  04/16/2023 7:15 AM

## 2023-04-16 NOTE — Plan of Care (Signed)
  Problem: Education: Goal: Knowledge of disease or condition will improve Outcome: Progressing Goal: Knowledge of secondary prevention will improve (MUST DOCUMENT ALL) Outcome: Progressing Goal: Knowledge of patient specific risk factors will improve Loraine Leriche N/A or DELETE if not current risk factor) Outcome: Progressing   Problem: Ischemic Stroke/TIA Tissue Perfusion: Goal: Complications of ischemic stroke/TIA will be minimized Outcome: Progressing   Problem: Coping: Goal: Will verbalize positive feelings about self Outcome: Progressing Goal: Will identify appropriate support needs Outcome: Progressing   Problem: Health Behavior/Discharge Planning: Goal: Ability to manage health-related needs will improve Outcome: Progressing Goal: Goals will be collaboratively established with patient/family Outcome: Progressing   Problem: Self-Care: Goal: Ability to participate in self-care as condition permits will improve Outcome: Progressing Goal: Verbalization of feelings and concerns over difficulty with self-care will improve Outcome: Progressing Goal: Ability to communicate needs accurately will improve Outcome: Progressing   Problem: Nutrition: Goal: Risk of aspiration will decrease Outcome: Progressing Goal: Dietary intake will improve Outcome: Progressing   Problem: Education: Goal: Knowledge of General Education information will improve Description: Including pain rating scale, medication(s)/side effects and non-pharmacologic comfort measures Outcome: Progressing   Problem: Health Behavior/Discharge Planning: Goal: Ability to manage health-related needs will improve Outcome: Progressing   Problem: Clinical Measurements: Goal: Ability to maintain clinical measurements within normal limits will improve Outcome: Progressing Goal: Will remain free from infection Outcome: Progressing Goal: Diagnostic test results will improve Outcome: Progressing Goal: Respiratory  complications will improve Outcome: Progressing Goal: Cardiovascular complication will be avoided Outcome: Progressing   Problem: Activity: Goal: Risk for activity intolerance will decrease Outcome: Progressing   Problem: Nutrition: Goal: Adequate nutrition will be maintained Outcome: Progressing   Problem: Coping: Goal: Level of anxiety will decrease Outcome: Progressing   Problem: Elimination: Goal: Will not experience complications related to bowel motility Outcome: Progressing Goal: Will not experience complications related to urinary retention Outcome: Progressing

## 2023-04-16 NOTE — Progress Notes (Signed)
PROGRESS NOTE    [name] [name]  ZOX:[id number] DOB: Sep 04, 1980 DOA: 03/27/2023 PCP: Claiborne Rigg, NP   Brief Narrative:  This 42 years old male with PMH significant for essential hypertension, ESRD on hemodialysis, SLE with APLA previously on Eliquis, CVA, chronic thrombocytopenia, anemia of chronic disease, history of seizures, history of medication noncompliance presented to the ED from dialysis for not acting himself during dialysis.  Patient was found to have active seizures in the ER with tongue biting and was hypoxic with SpO2 of 60% on room air which improved to 100% on nonrebreather and was treated with IV Ativan.  Patient remained postictal in the ER occasionally agitated.He was loaded with Keppra.  Neurology was consulted.  CT head without acute bleed but possibly shows new stroke.  Patient had multiple ER visits for breakthrough seizures.  MRI showed acute infarct of the left PCA territory, no hemorrhage or mass effect.  Patient was admitted in the ICU. TRH pickup 03/29/2023   Medically optimized and INR therapeutic at 3.0 on 10/22.  CIR declined.   TOC working up for SNF, has cognitive/insight issues and unsafe to discharge home by self.  DC pending bed availability.   Assessment & Plan:   Principal Problem:   Seizure [facility]) Active Problems:   Cerebrovascular accident (CVA) due to embolism of cerebral artery (HCC)  Seizure with postictal encephalopathy / History of seizures: History of complex partial seizures. Multiple ED visits for breakthrough seizures. Questionable compliance-reportedly misses about 3-4 Doses of Keppra per week. Admitted for seizure and aspiration pneumonitis.  Received Ativan and Keppra load EEG showed moderate to diffuse encephalopathy, No evidence of seizures or epileptiform discharges. Continue Keppra 500 Mg twice daily. Seizure precautions. No further seizures reported.   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 Prior  left PCA infarct in August 2024. Had been discharged on Eliquis and Lipitor. MRI showed multiple acute and subacute infarcts.  Per neurology, likely secondary to antiphospholipid syndrome on Coumadin with subtherapeutic INR and noncompliance. Noncompliant to warfarin, initiated on warfarin with heparin IV bridge. Compliance has been counseled this admission. Continue Olanzapine prn for agitation Due to somnolence on 10/16, obtained CT head and EEG without acute abnormalities.  Mental status has improved and stable since. He has cognitive impairment and poor insight into his overall medical condition and physical impairment, recommending CIR-but per CIR, not a candidate for their department.  TOC working on SNF.   Acute hypoxic respiratory failure: Likely multifactorial. Resolved.   ESRD on MWF HD/hyperkalemia Nephrology following for HD needs. Stable. HD as per schedule.   Metabolic bone disease Significant hyperphosphatemia >30.   Nephrology resuming binder and Sensipar.  Phosphorus 8.5.   Essential hypertension: Controlled on as needed hydralazine.   Hyperlipidemia: Continue Lipitor   Anemia in ESRD: Stable.   Thrombocytopenia Appears new compared to August 2024.  Resolved.   Acute encephalopathy: > Resolved. Noted on 10/16. CT head without acute findings.  EEG suggestive of moderate diffuse encephalopathy.  No seizures or epileptiform discharges. Alert and oriented.  No agitation.   Antiphospholipid Syndrome  SLE  Poor Med Adherence: Patient has tried multiple anticoagulants including warfarin, eliquis, xarelto, but struggles w/ med adherence.  Looks like he was most recently taking Warfarin, but INR on admission suggests pt was not taking regularly.  Although warfarin is first line for antiphopsolipid syndrome, given pt has poor follow-up and poor med adherence, Eliquis may have been a better option.  However, as per neurology  note, given patient's ESRD on dialysis and  antiphospholipid syndrome, Coumadin may be the best option and he may not have much choices. Completed IV heparin bridge.  Goal INR 2.5-3.5.  INR 3 on 10/22.  Off heparin bridge.   Body mass index is 30.05 kg/m.     DVT prophylaxis:  Coumadin Code Status: Full code Family Communication:No family at bed side Disposition Plan:    Status is: Inpatient Remains inpatient appropriate because:   From a medical standpoint, he is stable for DC to next level of care.  Extremely unsafe to return home immediately due to cognitive impairment, poor insight, lives alone, may not be able to manage his medications, HD needs.  Hopefully can go to SNF with improvement in all of these prior to returning home.  Declined by CIR.  Awaiting SNF.  Discussed with TOC 10/29.      Consultants:  Nephrology Neurology  Procedures:  Antimicrobials:  Anti-infectives (From admission, onward)    Start     Dose/Rate Route Frequency Ordered Stop   03/27/23 1545  cefTRIAXone (ROCEPHIN) 1 g in sodium chloride 0.9 % 100 mL IVPB        1 g 200 mL/hr over 30 Minutes Intravenous  Once 03/27/23 1544 03/27/23 1740   03/27/23 1545  azithromycin (ZITHROMAX) 500 mg in sodium chloride 0.9 % 250 mL IVPB  Status:  Discontinued        500 mg 250 mL/hr over 60 Minutes Intravenous  Once 03/27/23 1544 03/27/23 1727      Subjective: Patient was seen and examined at bedside. Overnight events noted.   Patient was lying comfortably in the bed,  He reports doing better. He is awaiting insurance authorization for SNF   Objective: Vitals:   04/16/23 0314 04/16/23 0500 04/16/23 0837 04/16/23 1128  BP: (!) 140/102  109/88 (!) 132/90  Pulse: 78  79 77  Resp: 18  16 17   Temp: 98.6 F (37 C)  98 F (36.7 C)   TempSrc: Oral  Oral   SpO2: 100%  100% 100%  Weight:  108.8 kg    Height:        Intake/Output Summary (Last 24 hours) at 04/16/2023 1135 Last data filed at 04/16/2023 0745 Gross per 24 hour  Intake 300 ml  Output  2000 ml  Net -1700 ml    Filed Weights   04/15/23 1323 04/15/23 1730 04/16/23 0500  Weight: 110 kg 108.1 kg 108.8 kg    Examination:  General exam: Appears comfortable, not in any acute distress.  Deconditioned. Respiratory system: CTA bilaterally. Respiratory effort normal.  RR 15 Cardiovascular system: S1 & S2 heard, RRR. No JVD, murmurs, rubs, gallops or clicks. No pedal edema. Gastrointestinal system: Abdomen is non distended, soft and non tender.  Normal bowel sounds heard. Central nervous system: Alert and oriented x 3. No focal neurological deficits. Extremities: No edema, no cyanosis, no clubbing Skin: No rashes, lesions or ulcers Psychiatry: Judgement and insight appear normal. Mood & affect appropriate.     Data Reviewed: I have personally reviewed following labs and imaging studies  CBC: Recent Labs  Lab 04/10/23 0446 04/11/23 0639 04/13/23 0645 04/15/23 0458 04/15/23 1335  WBC 7.1 8.8 7.3  --  7.4  NEUTROABS  --  6.2  --   --   --   HGB 11.0* 10.7* 10.4* 10.4* 10.2*  HCT 33.9* 33.4* 32.1* 32.4* 31.9*  MCV 92.6 92.3 93.3  --  94.7  PLT 200 221 208  --  199   Basic Metabolic Panel: Recent Labs  Lab 04/11/23 0639 04/13/23 0645 04/15/23 0458  NA 133* 137 135  K 4.7 4.7 4.2  CL 92* 98 96*  CO2 21* 23 23  GLUCOSE 115* 87 84  BUN 89* 85* 81*  CREATININE 17.18* 16.04* 14.57*  CALCIUM 8.8* 8.4* 8.9  MG 2.5*  --   --   PHOS 9.6* 8.5*  --    GFR: Estimated Creatinine Clearance: 8.5 mL/min (A) (by C-G formula based on SCr of 14.57 mg/dL (H)). Liver Function Tests: Recent Labs  Lab 04/11/23 0639 04/13/23 0645  AST 21  --   ALT 20  --   ALKPHOS 64  --   BILITOT 0.5  --   PROT 7.5  --   ALBUMIN 3.6 3.3*   No results for input(s): "LIPASE", "AMYLASE" in the last 168 hours. No results for input(s): "AMMONIA" in the last 168 hours. Coagulation Profile: Recent Labs  Lab 04/12/23 1036 04/13/23 0519 04/14/23 0554 04/15/23 0458 04/16/23 0524  INR  3.5* 3.7* 3.6* 3.8* 3.3*   Cardiac Enzymes: No results for input(s): "CKTOTAL", "CKMB", "CKMBINDEX", "TROPONINI" in the last 168 hours. BNP (last 3 results) No results for input(s): "PROBNP" in the last 8760 hours. HbA1C: No results for input(s): "HGBA1C" in the last 72 hours. CBG: Recent Labs  Lab 04/10/23 1641 04/10/23 1943 04/10/23 2332 04/11/23 0340 04/13/23 1129  GLUCAP 80 122* 104* 87 104*   Lipid Profile: No results for input(s): "CHOL", "HDL", "LDLCALC", "TRIG", "CHOLHDL", "LDLDIRECT" in the last 72 hours. Thyroid Function Tests: No results for input(s): "TSH", "T4TOTAL", "FREET4", "T3FREE", "THYROIDAB" in the last 72 hours. Anemia Panel: No results for input(s): "VITAMINB12", "FOLATE", "FERRITIN", "TIBC", "IRON", "RETICCTPCT" in the last 72 hours. Sepsis Labs: No results for input(s): "PROCALCITON", "LATICACIDVEN" in the last 168 hours.  No results found for this or any previous visit (from the past 240 hour(s)).   Radiology Studies: No results found.  Scheduled Meds:  atorvastatin  40 mg Oral Daily   Chlorhexidine Gluconate Cloth  6 each Topical Q0600   cinacalcet  60 mg Oral Q supper   levETIRAcetam  500 mg Oral BID   sevelamer carbonate  3,200 mg Oral TID WC   warfarin  2.5 mg Oral ONCE-1600   Warfarin - Pharmacist Dosing Inpatient   Does not apply q1600   Continuous Infusions:   LOS: 20 days    Time spent: 35 mins    Willeen Niece, MD [facility]   If 7PM-7AM, please contact night-coverage
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
## 2023-04-16 NOTE — Progress Notes (Signed)
[facility] Progress Note   Subjective:    Seen and examined patient at bedside. Tolerated yesterday's HD with net UF 2L. Denies SOB, CP, and N/V. Awaiting SNF placement. Plan for HD 10/30 if still here.  Objective Vitals:   04/16/23 0314 04/16/23 0500 04/16/23 0837 04/16/23 1128  BP: (!) 140/102  109/88 (!) 132/90  Pulse: 78  79 77  Resp: 18  16 17   Temp: 98.6 F (37 C)  98 F (36.7 C) 98.1 F (36.7 C)  TempSrc: Oral  Oral Oral  SpO2: 100%  100% 100%  Weight:  108.8 kg    Height:       Physical Exam General: Awake; Alert; NAD; on RA Heart:S1 and S2; MRGs Lungs:Clear throughout Abdomen:Soft and non-tender Extremities:No LE edema Dialysis Access: Right IJ TDC, nonfunctional lue avf    Filed Weights   04/15/23 1323 04/15/23 1730 04/16/23 0500  Weight: 110 kg 108.1 kg 108.8 kg    Intake/Output Summary (Last 24 hours) at 04/16/2023 1335 Last data filed at 04/16/2023 0745 Gross per 24 hour  Intake 300 ml  Output 2000 ml  Net -1700 ml    Additional Objective Labs: Basic Metabolic Panel: Recent Labs  Lab 04/11/23 0639 04/13/23 0645 04/15/23 0458  NA 133* 137 135  K 4.7 4.7 4.2  CL 92* 98 96*  CO2 21* 23 23  GLUCOSE 115* 87 84  BUN 89* 85* 81*  CREATININE 17.18* 16.04* 14.57*  CALCIUM 8.8* 8.4* 8.9  PHOS 9.6* 8.5*  --    Liver Function Tests: Recent Labs  Lab 04/11/23 0639 04/13/23 0645  AST 21  --   ALT 20  --   ALKPHOS 64  --   BILITOT 0.5  --   PROT 7.5  --   ALBUMIN 3.6 3.3*   No results for input(s): "LIPASE", "AMYLASE" in the last 168 hours. CBC: Recent Labs  Lab 04/10/23 0446 04/11/23 0639 04/13/23 0645 04/15/23 0458 04/15/23 1335  WBC 7.1 8.8 7.3  --  7.4  NEUTROABS  --  6.2  --   --   --   HGB 11.0* 10.7* 10.4* 10.4* 10.2*  HCT 33.9* 33.4* 32.1* 32.4* 31.9*  MCV 92.6 92.3 93.3  --  94.7  PLT 200 221 208  --  199   Blood Culture    Component Value Date/Time   SDES BLOOD RIGHT HAND 01/19/2023 0213    SPECREQUEST  01/19/2023 0865    BOTTLES DRAWN AEROBIC AND ANAEROBIC Blood Culture results may not be optimal due to an excessive volume of blood received in culture bottles   CULT  01/19/2023 0213    NO GROWTH 5 DAYS Performed at [facility], [address], [place], [place] [address]    REPTSTATUS 01/24/2023 FINAL 01/19/2023 6295    Cardiac Enzymes: No results for input(s): "CKTOTAL", "CKMB", "CKMBINDEX", "TROPONINI" in the last 168 hours. CBG: Recent Labs  Lab 04/10/23 1641 04/10/23 1943 04/10/23 2332 04/11/23 0340 04/13/23 1129  GLUCAP 80 122* 104* 87 104*   Iron Studies: No results for input(s): "IRON", "TIBC", "TRANSFERRIN", "FERRITIN" in the last 72 hours. Lab Results  Component Value Date   INR 3.3 (H) 04/16/2023   INR 3.8 (H) 04/15/2023   INR 3.6 (H) 04/14/2023   PROTIME 28.8 (H) 11/04/2013   PROTIME 21.6 (H) 10/14/2013   PROTIME 18.0 (H) 09/23/2013   Studies/Results: No results found.  Medications:   atorvastatin  40 mg Oral Daily   Chlorhexidine Gluconate Cloth  6  each Topical Q0600   cinacalcet  60 mg Oral Q supper   levETIRAcetam  500 mg Oral BID   sevelamer carbonate  3,200 mg Oral TID WC   warfarin  2.5 mg Oral ONCE-1600   Warfarin - Pharmacist Dosing Inpatient   Does not apply q1600    Dialysis Orders: [facility] MWF  4h   400/800   101.5kg   2/2 bath   RIJ TDC    Heparin none - last OP HD today 10/9, post wt 110.5kg (9kg over) - mircera 50 mcg IV q 2wks, last 10/2, due 10/16  - venofer 100mg  three times per week thru today - rocaltrol 2.0 mcg   Assessment/Plan: Seizures - acute on chronic, takes Keppra at home but misses doses. MRI with scattered infarcts - neurology implicates subtherapeutic INR.    H/o antiphospholipid syndrome - hx SLE, has been tried on multiple anticoagulants in the past but struggles w/ medication adherence.  On coumadin    ESRD - on HD MWF.  Next dialysis 10/30, in recliner if possible   HTN/volume - UF with HD as  tolerated, continue fluid restriction.   Anemia of ESRD - Hb 10.2-stable, no esa needs.    CKD-MBD - CCa normalized, and phos is very high. Resumed binder and sensipar. Renal diet. Po4 down to 8.5. Re-checking labs.   Dispo: awaiting SNF placement  Salome Holmes, NP [facility] 04/16/2023,1:35 PM  LOS: 20 days

## 2023-04-17 DIAGNOSIS — R4182 Altered mental status, unspecified: Secondary | ICD-10-CM

## 2023-04-17 DIAGNOSIS — R569 Unspecified convulsions: Secondary | ICD-10-CM | POA: Diagnosis not present

## 2023-04-17 DIAGNOSIS — R0902 Hypoxemia: Secondary | ICD-10-CM

## 2023-04-17 LAB — RENAL FUNCTION PANEL
Albumin: 3.3 g/dL — ABNORMAL LOW (ref 3.5–5.0)
Anion gap: 17 — ABNORMAL HIGH (ref 5–15)
BUN: 74 mg/dL — ABNORMAL HIGH (ref 6–20)
CO2: 21 mmol/L — ABNORMAL LOW (ref 22–32)
Calcium: 8.5 mg/dL — ABNORMAL LOW (ref 8.9–10.3)
Chloride: 98 mmol/L (ref 98–111)
Creatinine, Ser: 13.57 mg/dL — ABNORMAL HIGH (ref 0.61–1.24)
GFR, Estimated: 4 mL/min — ABNORMAL LOW (ref >60)
Glucose, Bld: 86 mg/dL (ref 70–99)
Phosphorus: 7.2 mg/dL — ABNORMAL HIGH (ref 2.5–4.6)
Potassium: 4.5 mmol/L (ref 3.5–5.1)
Sodium: 136 mmol/L (ref 135–145)

## 2023-04-17 LAB — CBC
HCT: 30.4 % — ABNORMAL LOW (ref 39.0–52.0)
Hemoglobin: 9.9 g/dL — ABNORMAL LOW (ref 13.0–17.0)
MCH: 29.7 pg (ref 26.0–34.0)
MCHC: 32.6 g/dL (ref 30.0–36.0)
MCV: 91.3 fL (ref 80.0–100.0)
Platelets: 158 10*3/uL (ref 150–400)
RBC: 3.33 MIL/uL — ABNORMAL LOW (ref 4.22–5.81)
RDW: 14 % (ref 11.5–15.5)
WBC: 7 10*3/uL (ref 4.0–10.5)
nRBC: 0 % (ref 0.0–0.2)

## 2023-04-17 LAB — BASIC METABOLIC PANEL
Anion gap: 12 (ref 5–15)
BUN: 73 mg/dL — ABNORMAL HIGH (ref 6–20)
CO2: 21 mmol/L — ABNORMAL LOW (ref 22–32)
Calcium: 8.2 mg/dL — ABNORMAL LOW (ref 8.9–10.3)
Chloride: 99 mmol/L (ref 98–111)
Creatinine, Ser: 13.61 mg/dL — ABNORMAL HIGH (ref 0.61–1.24)
GFR, Estimated: 4 mL/min — ABNORMAL LOW (ref >60)
Glucose, Bld: 84 mg/dL (ref 70–99)
Potassium: 4.3 mmol/L (ref 3.5–5.1)
Sodium: 132 mmol/L — ABNORMAL LOW (ref 135–145)

## 2023-04-17 LAB — MAGNESIUM: Magnesium: 2.4 mg/dL (ref 1.7–2.4)

## 2023-04-17 LAB — PHOSPHORUS: Phosphorus: 7 mg/dL — ABNORMAL HIGH (ref 2.5–4.6)

## 2023-04-17 LAB — PROTIME-INR
INR: 3.1 — ABNORMAL HIGH (ref 0.8–1.2)
Prothrombin Time: 32.5 s — ABNORMAL HIGH (ref 11.4–15.2)

## 2023-04-17 MED ORDER — HEPARIN SODIUM (PORCINE) 1000 UNIT/ML DIALYSIS
1000.0000 [IU] | INTRAMUSCULAR | Status: DC | PRN
Start: 2023-04-17 — End: 2023-04-17

## 2023-04-17 MED ORDER — LIDOCAINE-PRILOCAINE 2.5-2.5 % EX CREA: 1.0000 | TOPICAL_CREAM | CUTANEOUS | Status: DC | PRN

## 2023-04-17 MED ORDER — LIDOCAINE HCL (PF) 1 % IJ SOLN
5.0000 mL | INTRAMUSCULAR | Status: DC | PRN
Start: 2023-04-17 — End: 2023-04-17

## 2023-04-17 MED ORDER — WARFARIN SODIUM 4 MG PO TABS
4.0000 mg | ORAL_TABLET | Freq: Once | ORAL | Status: AC
  Administered 2023-04-17: 4 mg via ORAL
  Filled 2023-04-17: qty 1

## 2023-04-17 MED ORDER — PENTAFLUOROPROP-TETRAFLUOROETH EX AERO: 1.0000 | INHALATION_SPRAY | CUTANEOUS | Status: DC | PRN

## 2023-04-17 MED ORDER — DARBEPOETIN ALFA 60 MCG/0.3ML IJ SOSY
60.0000 ug | PREFILLED_SYRINGE | INTRAMUSCULAR | Status: DC
Start: 2023-04-17 — End: 2023-04-20
  Administered 2023-04-17: 60 ug via SUBCUTANEOUS
  Filled 2023-04-17: qty 0.3

## 2023-04-17 MED ORDER — ALTEPLASE 2 MG IJ SOLR: 2.0000 mg | Freq: Once | INTRAMUSCULAR | Status: DC | PRN

## 2023-04-17 MED ORDER — HEPARIN SODIUM (PORCINE) 1000 UNIT/ML IJ SOLN
3200.0000 [IU] | Freq: Once | INTRAMUSCULAR | Status: AC
  Administered 2023-04-20: 3200 [IU]
  Filled 2023-04-17: qty 4

## 2023-04-17 NOTE — Progress Notes (Signed)
Progress Note   Patient: [name] [name] ZOX:[id number] DOB: 03-Sep-1980 DOA: 03/27/2023     21 DOS: the patient was seen and examined on 04/17/2023   Brief hospital course: 42 years old male with PMH significant for essential hypertension, ESRD on hemodialysis, SLE with APLA previously on Eliquis, CVA, chronic thrombocytopenia, anemia of chronic disease, history of seizures, history of medication noncompliance presented to the ED from dialysis for not acting himself during dialysis.  Patient was found to have active seizures in the ER with tongue biting and was hypoxic with SpO2 of 60% on room air which improved to 100% on nonrebreather and was treated with IV Ativan.  Patient remained postictal in the ER occasionally agitated.He was loaded with Keppra.  Neurology was consulted.  CT head without acute bleed but possibly shows new stroke.  Patient had multiple ER visits for breakthrough seizures.  MRI showed acute infarct of the left PCA territory, no hemorrhage or mass effect.  Patient was admitted in the ICU. TRH pickup 03/29/2023  Assessment and Plan: Seizure with postictal encephalopathy / History of seizures: History of complex partial seizures. Multiple ED visits for breakthrough seizures. Questionable compliance-reportedly misses about 3-4 Doses of Keppra per week. Admitted for seizure and aspiration pneumonitis.  Received Ativan and Keppra load EEG showed moderate to diffuse encephalopathy, No evidence of seizures or epileptiform discharges. Continue Keppra 500 Mg twice daily. Seizure precautions. No further seizures reported.   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 Prior left PCA infarct in August 2024. Had been discharged on Eliquis and Lipitor. MRI showed multiple acute and subacute infarcts.  Per neurology, likely secondary to antiphospholipid syndrome on Coumadin with subtherapeutic INR and noncompliance. Noncompliant to warfarin, continued on  coumadin Compliance has been counseled this admission. Continue Olanzapine prn for agitation Noted to have somnolence on 10/16, obtained CT head and EEG without acute abnormalities.  Mental status has improved and stable since. Attempt at SNF placement. Pt had been denied as well as CIR. Plan d/c with HHPTOT on d/c   Acute hypoxic respiratory failure: Likely multifactorial. Resolved.   ESRD on MWF HD/hyperkalemia Nephrology following for HD needs. Stable. On MWF HD.   Metabolic bone disease Significant hyperphosphatemia >30.   Nephrology resuming binder and Sensipar.  Phosphorus 7.0   Essential hypertension: Controlled on as needed hydralazine.   Hyperlipidemia: Continue Lipitor   Anemia in ESRD: Stable.   Thrombocytopenia Appears new compared to August 2024.  Resolved.   Acute encephalopathy: > Resolved. Noted on 10/16. CT head without acute findings.  EEG suggestive of moderate diffuse encephalopathy.  No seizures or epileptiform discharges. Alert and oriented.  No agitation.    Antiphospholipid Syndrome  SLE  Poor Med Adherence: Patient has tried multiple anticoagulants including warfarin, eliquis, xarelto, but struggles w/ med adherence.  Looks like he was most recently taking Warfarin, but INR on admission suggests pt was not taking regularly.  Plans for coumadin on d/c Re-emphasized compliance to medications.   Subjective: Without complaints  Physical Exam: Vitals:   04/17/23 1335 04/17/23 1339 04/17/23 1355 04/17/23 1430  BP: (!) 123/90 130/88  (!) 136/100  Pulse: 70 68  72  Resp: 12 11  18   Temp: 98.1 F (36.7 C)   98.2 F (36.8 C)  TempSrc: Oral   Oral  SpO2: 100% 99%  99%  Weight:   107 kg   Height:       General exam: Awake, laying in bed, in nad Respiratory system:  Normal respiratory effort, no wheezing Cardiovascular system: regular rate, s1, s2 Gastrointestinal system: Soft, nondistended, positive BS Central nervous system: CN2-12 grossly  intact, strength intact Extremities: Perfused, no clubbing Skin: Normal skin turgor, no notable skin lesions seen Psychiatry: Mood normal // no visual hallucinations   Data Reviewed:  Labs reviewed: Na 132, K 4.3, Cr 8.2, WBC 7.0, Hgb 9.9, Plts 158  Family Communication: Pt in room, family not at bedside  Disposition: Status is: Inpatient Remains inpatient appropriate because: severity of illness  Planned Discharge Destination: Home    Author: Rickey Barbara, MD 04/17/2023 5:24 PM  For on call review [email or web address].

## 2023-04-17 NOTE — Hospital Course (Signed)
42 years old male with PMH significant for essential hypertension, ESRD on hemodialysis, SLE with APLA previously on Eliquis, CVA, chronic thrombocytopenia, anemia of chronic disease, history of seizures, history of medication noncompliance presented to the ED from dialysis for not acting himself during dialysis.  Patient was found to have active seizures in the ER with tongue biting and was hypoxic with SpO2 of 60% on room air which improved to 100% on nonrebreather and was treated with IV Ativan.  Patient remained postictal in the ER occasionally agitated.He was loaded with Keppra.  Neurology was consulted.  CT head without acute bleed but possibly shows new stroke.  Patient had multiple ER visits for breakthrough seizures.  MRI showed acute infarct of the left PCA territory, no hemorrhage or mass effect.  Patient was admitted in the ICU. TRH pickup 03/29/2023
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
## 2023-04-17 NOTE — TOC Progression Note (Signed)
Transition of Care [facility]) - Progression Note    Patient Details  Name: [name] [name] MRN: [id number] Date of Birth: Dec 21, 1980  Transition of Care [facility]) CM/SW Contact  Gordy Clement, RN Phone Number: 04/17/2023, 2:15 PM  Clinical Narrative:    RNCM notified via secure chat that patient was denied SNF and will most likely go home.  RNCM met with patient bedside to discuss plan. Patient states that he "lives" at the [facility] [address] and would return there although he stated he "had to get some more money up" to get a room. He stated he had two friends ( [place] and [place] ?- couldn't remember last name) that could help him.  Patient denied missing HD in the past and stated he takes the bus.  He stated his friend Aggie Cosier will pick him up if available or he will need a cab ride.  RNCM has ordered a bedside commode to be delivered to the room prior to DC . Home Health will need to be ordered and arranged .   TOC will continue to follow patient for any additional discharge needs         Expected Discharge Plan: Skilled Nursing Facility    Expected Discharge Plan and Services                                               Social Determinants of Health (SDOH) Interventions SDOH Screenings   Food Insecurity: No Food Insecurity (04/01/2023)  Housing: Low Risk  (04/01/2023)  Transportation Needs: No Transportation Needs (04/01/2023)  Utilities: Not At Risk (04/01/2023)  Alcohol Screen: Low Risk  (03/26/2023)  Depression (PHQ2-9): Low Risk  (03/26/2023)  Financial Resource Strain: Low Risk  (03/26/2023)  Physical Activity: Inactive (03/26/2023)  Social Connections: Moderately Isolated (03/26/2023)  Stress: No Stress Concern Present (03/26/2023)  Tobacco Use: Unknown (04/01/2023)  Health Literacy: Adequate Health Literacy (03/26/2023)    Readmission Risk Interventions    03/28/2023    2:57 PM 01/25/2023    3:40 PM  Readmission Risk Prevention Plan  Post  Dischage Appt    Medication Screening    Transportation Screening Complete   Medication Review (RN Care Manager) Referral to Pharmacy   PCP or Specialist appointment within 3-5 days of discharge Complete   HRI or Home Care Consult Complete   SW Recovery Care/Counseling Consult Complete   Palliative Care Screening Not Applicable   Skilled Nursing Facility Not Applicable      Information is confidential and restricted. Go to Review Flowsheets to unlock data.

## 2023-04-17 NOTE — Progress Notes (Signed)
PT Cancellation Note  Patient Details Name: [name] [name] MRN: [id number] DOB: 09/20/1980   Cancelled Treatment:    Reason Eval/Treat Not Completed: Patient at procedure or test/unavailable  Off unit for dialysis this morning. Will attempt to work with pt this afternoon when he returns, as schedule permits.  [name], PT, DPT [facility]  Rehabilitation Services Physical Therapist Office: [phone] Website: [email or web address]  [name] 04/17/2023, 10:49 AM

## 2023-04-17 NOTE — Progress Notes (Signed)
Received patient in bed to unit.  Alert and oriented.  Informed consent signed and in chart.   TX duration:3.5 hours  Patient tolerated well.  Transported back to the room  Alert, without acute distress.  Hand-off given to patient's nurse.   Access used: RIJ HD Cath Access issues: none  Total UF removed: 3L Medication(s) given: none   04/17/23 1335  Vitals  Temp 98.1 F (36.7 C)  Temp Source Oral  BP (!) 123/90  MAP (mmHg) 110  BP Location Left Wrist  BP Method Automatic  Patient Position (if appropriate) Sitting  Pulse Rate 70  ECG Heart Rate 71  Resp 12  Oxygen Therapy  SpO2 100 %  O2 Device Room Air  Patient Activity (if Appropriate) In chair  During Treatment Monitoring  Duration of HD Treatment -hour(s) 3.5 hour(s)  HD Safety Checks Performed Yes  Intra-Hemodialysis Comments Tx completed;Tolerated well  Dialysis Fluid Bolus Normal Saline  Bolus Amount (mL) 300 mL  Hemodialysis Catheter Right Internal jugular Double lumen Permanent (Tunneled)  Placement Date/Time: 07/16/22 1625   Serial / Lot #: [id number]  Expiration Date: 03/21/27  Time Out: Correct patient;Correct site;Correct procedure  Maximum sterile barrier precautions: Hand hygiene;Cap;Mask;Sterile gown;Sterile gloves;Large sterile ...  Site Condition No complications  Blue Lumen Status Flushed;Heparin locked;Dead end cap in place  Red Lumen Status Heparin locked;Dead end cap in place;Flushed  Purple Lumen Status N/A  Catheter fill solution Heparin 1000 units/ml  Catheter fill volume (Arterial) 1.6 cc  Catheter fill volume (Venous) 1.6  Dressing Type Transparent  Dressing Status Antimicrobial disc in place;Clean, Dry, Intact  Interventions  (deaccessed)  Drainage Description None  Dressing Change Due 04/22/23  Post treatment catheter status Capped and Clamped     Stacie Glaze LPN [facility]

## 2023-04-17 NOTE — Plan of Care (Signed)
  Problem: Coping: Goal: Will verbalize positive feelings about self Outcome: Progressing Goal: Will identify appropriate support needs Outcome: Progressing   Problem: Nutrition: Goal: Risk of aspiration will decrease Outcome: Progressing

## 2023-04-17 NOTE — Progress Notes (Signed)
PHARMACY - ANTICOAGULATION CONSULT NOTE  Pharmacy Consult for warfarin Indication:  APLA syndrome  Allergies  Allergen Reactions   Zyrtec [Cetirizine] Swelling    FACIAL SWELLING, NOT CATEGORIZED   Cetirizine & Related Swelling    Patient Measurements: Height: 6\' 1"  (185.4 cm) Weight: 108.8 kg (239 lb 13.8 oz) IBW/kg (Calculated) : 79.9 Heparin Dosing Weight: 103 kg   Vital Signs: Temp: 98 F (36.7 C) (10/30 0731) BP: 135/88 (10/30 0731) Pulse Rate: 73 (10/30 0731)  Labs: Recent Labs    04/15/23 0458 04/15/23 1335 04/16/23 0524 04/17/23 0644  HGB 10.4* 10.2*  --  9.9*  HCT 32.4* 31.9*  --  30.4*  PLT  --  199  --  158  LABPROT 37.4*  --  34.2* 32.5*  INR 3.8*  --  3.3* 3.1*  CREATININE 14.57*  --   --   --    Estimated Creatinine Clearance: 8.5 mL/min (A) (by C-G formula based on SCr of 14.57 mg/dL (H)).  Medical History: Past Medical History:  Diagnosis Date   ESRD on hemodialysis (HCC)    Hypertension    Lupus    Seizures (HCC)    Seizures (HCC)    Stroke (HCC)    53 or 42 years of age.     Wears glasses    Assessment: 42 yo M presented with seizures with HD. Patient has history antiphospholipid syndrome, leading to hypercoagulable state and causing hx of strokes. INR on admission 1.1 - most likely not taking Warfarin at home. Warfarin held and sq heparin initiated in interim. MRI showing multiple acute and subacute infarcts. Per MD, discussed with neurology and would like to start warfarin and use heparin bridge until therapeutic.  10/30 AM: INR 3.1 - therapeutic. Patient is eating 100% of meals per chart, no new DDIs noted. Hgb stable. Patient has been averaging ~6 mg daily since he has been therapeutic on warfarin with intermittently supratherapeutic INRs.    Goal of Therapy:  INR 2.5-3.5 per neurology  Monitor platelets by anticoagulation protocol: Yes   Plan:  Give warfarin 4 mg PO x1 dose Check INR daily while on warfarin Continue to monitor  H&H and platelets   Thank you for allowing pharmacy to be a part of this patient's care.  Jani Gravel, PharmD Clinical Pharmacist  04/17/2023 7:39 AM
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
## 2023-04-17 NOTE — Progress Notes (Addendum)
[facility] Progress Note   Subjective:    Seen and examined patient on the HD unit. Preparing to start treatment in the recliner. Feels fine. Denies SOB, CP, and N/V. UFG set 3L. Awaiting SNF placement.  Objective Vitals:   04/17/23 0958 04/17/23 1030 04/17/23 1100 04/17/23 1130  BP: (!) 136/94 (!) 137/98 (!) 131/96 (!) 124/97  Pulse: 61 (!) 57 73 77  Resp: 10 11 17 18   Temp:      TempSrc:      SpO2: 100% 99% 100% 100%  Weight:      Height:       Physical Exam General: Awake; Alert; NAD; on RA Heart:S1 and S2; MRGs Lungs:Clear throughout Abdomen:Soft and non-tender Extremities:No LE edema Dialysis Access: Right IJ TDC, nonfunctional lue avf    Filed Weights   04/15/23 1730 04/16/23 0500 04/17/23 0938  Weight: 108.1 kg 108.8 kg 110.3 kg   No intake or output data in the 24 hours ending 04/17/23 1156  Additional Objective Labs: Basic Metabolic Panel: Recent Labs  Lab 04/13/23 0645 04/15/23 0458 04/17/23 0643 04/17/23 0644  NA 137 135 136 132*  K 4.7 4.2 4.5 4.3  CL 98 96* 98 99  CO2 23 23 21* 21*  GLUCOSE 87 84 86 84  BUN 85* 81* 74* 73*  CREATININE 16.04* 14.57* 13.57* 13.61*  CALCIUM 8.4* 8.9 8.5* 8.2*  PHOS 8.5*  --  7.2* 7.0*   Liver Function Tests: Recent Labs  Lab 04/11/23 0639 04/13/23 0645 04/17/23 0643  AST 21  --   --   ALT 20  --   --   ALKPHOS 64  --   --   BILITOT 0.5  --   --   PROT 7.5  --   --   ALBUMIN 3.6 3.3* 3.3*   No results for input(s): "LIPASE", "AMYLASE" in the last 168 hours. CBC: Recent Labs  Lab 04/11/23 0639 04/13/23 0645 04/15/23 0458 04/15/23 1335 04/17/23 0644  WBC 8.8 7.3  --  7.4 7.0  NEUTROABS 6.2  --   --   --   --   HGB 10.7* 10.4* 10.4* 10.2* 9.9*  HCT 33.4* 32.1* 32.4* 31.9* 30.4*  MCV 92.3 93.3  --  94.7 91.3  PLT 221 208  --  199 158   Blood Culture    Component Value Date/Time   SDES BLOOD RIGHT HAND 01/19/2023 0213   SPECREQUEST  01/19/2023 1610    BOTTLES DRAWN AEROBIC AND  ANAEROBIC Blood Culture results may not be optimal due to an excessive volume of blood received in culture bottles   CULT  01/19/2023 0213    NO GROWTH 5 DAYS Performed at [facility], [address] [place], [place] [address]    REPTSTATUS 01/24/2023 FINAL 01/19/2023 4098    Cardiac Enzymes: No results for input(s): "CKTOTAL", "CKMB", "CKMBINDEX", "TROPONINI" in the last 168 hours. CBG: Recent Labs  Lab 04/10/23 1641 04/10/23 1943 04/10/23 2332 04/11/23 0340 04/13/23 1129  GLUCAP 80 122* 104* 87 104*   Iron Studies: No results for input(s): "IRON", "TIBC", "TRANSFERRIN", "FERRITIN" in the last 72 hours. Lab Results  Component Value Date   INR 3.1 (H) 04/17/2023   INR 3.3 (H) 04/16/2023   INR 3.8 (H) 04/15/2023   PROTIME 28.8 (H) 11/04/2013   PROTIME 21.6 (H) 10/14/2013   PROTIME 18.0 (H) 09/23/2013   Studies/Results: No results found.  Medications:   atorvastatin  40 mg Oral Daily   Chlorhexidine Gluconate Cloth  6 each Topical Q0600   cinacalcet  60 mg Oral Q supper   heparin sodium (porcine)  3,200 Units Intracatheter Once   levETIRAcetam  500 mg Oral BID   sevelamer carbonate  3,200 mg Oral TID WC   warfarin  4 mg Oral ONCE-1600   Warfarin - Pharmacist Dosing Inpatient   Does not apply q1600    Dialysis Orders: [facility] MWF  4h   400/800   101.5kg   2/2 bath   [facility] TDC    Heparin none - last OP HD today 10/9, post wt 110.5kg (9kg over) - mircera 50 mcg IV q 2wks, last 10/2, due 10/16  - venofer 100mg  three times per week thru today - rocaltrol 2.0 mcg   Assessment/Plan: Seizures - acute on chronic, takes Keppra at home but misses doses. MRI with scattered infarcts - neurology implicates subtherapeutic INR.    H/o antiphospholipid syndrome - hx SLE, has been tried on multiple anticoagulants in the past but struggles w/ medication adherence.  On coumadin    ESRD - on HD MWF. On HD in the recliner. Pre-HD standing weight 110.3kg. Chronic issue with  noncompliance with HD as outpatient. Push UF here as tolerated.   HTN/volume - UF with HD as tolerated, continue fluid restriction.   Anemia of ESRD - Hb now 9.9, will resume his ESA.   CKD-MBD - CCa normalized, and phos is trending down. Continue binder and sensipar. Renal diet.   Dispo: awaiting SNF placement  Salome Holmes, NP [facility] 04/17/2023,11:56 AM  LOS: 21 days

## 2023-04-17 NOTE — Plan of Care (Signed)

## 2023-04-18 ENCOUNTER — Encounter (HOSPITAL_COMMUNITY): Payer: Self-pay

## 2023-04-18 ENCOUNTER — Other Ambulatory Visit (HOSPITAL_COMMUNITY): Payer: Self-pay

## 2023-04-18 DIAGNOSIS — R569 Unspecified convulsions: Secondary | ICD-10-CM | POA: Diagnosis not present

## 2023-04-18 DIAGNOSIS — R0902 Hypoxemia: Secondary | ICD-10-CM | POA: Diagnosis not present

## 2023-04-18 DIAGNOSIS — R4182 Altered mental status, unspecified: Secondary | ICD-10-CM | POA: Diagnosis not present

## 2023-04-18 LAB — PROTIME-INR
INR: 2.7 — ABNORMAL HIGH (ref 0.8–1.2)
Prothrombin Time: 29 s — ABNORMAL HIGH (ref 11.4–15.2)

## 2023-04-18 MED ORDER — LEVETIRACETAM 500 MG PO TABS
500.0000 mg | ORAL_TABLET | Freq: Two times a day (BID) | ORAL | 0 refills | Status: DC
  Filled 2023-04-18: qty 60, 30d supply, fill #0

## 2023-04-18 MED ORDER — RENA-VITE PO TABS
1.0000 | ORAL_TABLET | Freq: Every day | ORAL | 0 refills | Status: DC
  Filled 2023-04-18: qty 60, 60d supply, fill #0

## 2023-04-18 MED ORDER — WARFARIN SODIUM 5 MG PO TABS: 5.0000 mg | ORAL_TABLET | Freq: Once | ORAL | Status: DC

## 2023-04-18 MED ORDER — SEVELAMER CARBONATE 800 MG PO TABS
3200.0000 mg | ORAL_TABLET | Freq: Three times a day (TID) | ORAL | 0 refills | Status: AC
  Filled 2023-04-18: qty 346, 29d supply, fill #0

## 2023-04-18 MED ORDER — ATORVASTATIN CALCIUM 40 MG PO TABS
40.0000 mg | ORAL_TABLET | Freq: Every day | ORAL | 0 refills | Status: DC
  Filled 2023-04-18: qty 30, 30d supply, fill #0

## 2023-04-18 MED ORDER — WARFARIN SODIUM 5 MG PO TABS
5.0000 mg | ORAL_TABLET | Freq: Once | ORAL | 0 refills | Status: DC
  Filled 2023-04-18: qty 30, 30d supply, fill #0

## 2023-04-18 MED ORDER — CINACALCET HCL 30 MG PO TABS
60.0000 mg | ORAL_TABLET | Freq: Every day | ORAL | 0 refills | Status: AC
  Filled 2023-04-18: qty 60, 30d supply, fill #0

## 2023-04-18 MED ORDER — DOCUSATE SODIUM 100 MG PO CAPS
100.0000 mg | ORAL_CAPSULE | Freq: Two times a day (BID) | ORAL | 0 refills | Status: DC | PRN
  Filled 2023-04-18: qty 30, 15d supply, fill #0

## 2023-04-18 NOTE — Progress Notes (Addendum)
PHARMACY - ANTICOAGULATION CONSULT NOTE  Pharmacy Consult for warfarin Indication:  APLA syndrome  Allergies  Allergen Reactions   Zyrtec [Cetirizine] Swelling    FACIAL SWELLING, NOT CATEGORIZED   Cetirizine & Related Swelling    Patient Measurements: Height: 6\' 1"  (185.4 cm) Weight: 107 kg (235 lb 14.3 oz) IBW/kg (Calculated) : 79.9 Heparin Dosing Weight: 103 kg   Vital Signs: Temp: 97.7 F (36.5 C) (10/31 0300) Temp Source: Oral (10/31 0300) BP: 133/98 (10/31 0300) Pulse Rate: 79 (10/31 0300)  Labs: Recent Labs    04/15/23 1335 04/16/23 0524 04/17/23 0643 04/17/23 0644 04/18/23 0714  HGB 10.2*  --   --  9.9*  --   HCT 31.9*  --   --  30.4*  --   PLT 199  --   --  158  --   LABPROT  --  34.2*  --  32.5* 29.0*  INR  --  3.3*  --  3.1* 2.7*  CREATININE  --   --  13.57* 13.61*  --    Estimated Creatinine Clearance: 9 mL/min (A) (by C-G formula based on SCr of 13.61 mg/dL (H)).  Medical History: Past Medical History:  Diagnosis Date   ESRD on hemodialysis (HCC)    Hypertension    Lupus    Seizures (HCC)    Seizures (HCC)    Stroke (HCC)    71 or 42 years of age.     Wears glasses    Assessment: 42 yo M presented with seizures with HD. Patient has history antiphospholipid syndrome, leading to hypercoagulable state and causing hx of strokes. INR on admission 1.1 - most likely not taking Warfarin at home. Warfarin held and sq heparin initiated in interim. MRI showing multiple acute and subacute infarcts. Per MD, discussed with neurology and would like to start warfarin and use heparin bridge until therapeutic.  10/31 AM: INR 2.7 - therapeutic., reduced from 3.1 yesterday, likely reflective of previous 2.5 mg doses. Patient is eating 100% of meals per chart, no new DDIs noted. Hgb stable. Patient has been averaging ~6 mg daily since he has been therapeutic on warfarin with intermittently supratherapeutic INRs.    Goal of Therapy:  INR 2.5-3.5 per neurology   Monitor platelets by anticoagulation protocol: Yes   Plan:  Give warfarin 5 mg PO x1 dose Check INR daily while on warfarin Continue to monitor H&H and platelets   Thank you for allowing pharmacy to be a part of this patient's care.   ADDENDUM:  Decision made to switch warfarin to apixaban with addition of bASA for APLS d/t lack of outpatient follow-up for warfarin/NR checks. Will plan to start Eliquis/bASA when INR is below goal. Copay for eliquis is $4. Patient will require education prior to discharge.   Jani Gravel, PharmD Clinical Pharmacist  04/18/2023 8:23 AM

## 2023-04-18 NOTE — Progress Notes (Signed)
Progress Note   Patient: [name] [name]:[id number] DOB: [birth year] DOA: 03/27/2023     22 DOS: the patient was seen and examined on 04/18/2023   Brief hospital course: 42 years old male with PMH significant for essential hypertension, ESRD on hemodialysis, SLE with APLA previously on Eliquis, CVA, chronic thrombocytopenia, anemia of chronic disease, history of seizures, history of medication noncompliance presented to the ED from dialysis for not acting himself during dialysis.  Patient was found to have active seizures in the ER with tongue biting and was hypoxic with SpO2 of 60% on room air which improved to 100% on nonrebreather and was treated with IV Ativan.  Patient remained postictal in the ER occasionally agitated.He was loaded with Keppra.  Neurology was consulted.  CT head without acute bleed but possibly shows new stroke.  Patient had multiple ER visits for breakthrough seizures.  MRI showed acute infarct of the left PCA territory, no hemorrhage or mass effect.  Patient was admitted in the ICU. TRH pickup 03/29/2023  Assessment and Plan: Seizure with postictal encephalopathy / History of seizures: History of complex partial seizures. Multiple ED visits for breakthrough seizures. Questionable compliance-reportedly misses about 3-4 Doses of Keppra per week. Admitted for seizure and aspiration pneumonitis.  Received Ativan and Keppra load EEG showed moderate to diffuse encephalopathy, No evidence of seizures or epileptiform discharges. Continue Keppra 500 Mg twice daily. Seizure precautions. No further seizures reported.   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 Prior left PCA infarct in August 2024. Had been discharged on Eliquis and Lipitor. MRI showed multiple acute and subacute infarcts.  Per neurology, likely secondary to antiphospholipid syndrome on Coumadin with subtherapeutic INR and noncompliance. Concerns of noncompliance to eliquis and to  warfarin Pt had been initially continued on coumadin with plan for outpt coumadin clinic There are multiple logistical issues precluding pt safely discharging on coumadin and following INR as outpatient -Discussed with Stroke MD. Recommendation now to transition back to eliquis with 81mg  aspirin once INR becomes subtherapeutic -Have consulted pharmacy   Acute hypoxic respiratory failure: Likely multifactorial. Resolved.   ESRD on MWF HD/hyperkalemia Nephrology following for HD needs. Stable. On MWF HD.   Metabolic bone disease Significant hyperphosphatemia >30.   Nephrology resuming binder and Sensipar.  Phosphorus 7.0   Essential hypertension: Controlled on as needed hydralazine.   Hyperlipidemia: Continue Lipitor   Anemia in ESRD: Stable.   Thrombocytopenia Appears new compared to August 2024.  Resolved.   Acute encephalopathy: > Resolved. Noted on 10/16. CT head without acute findings.  EEG suggestive of moderate diffuse encephalopathy.  No seizures or epileptiform discharges. Alert and oriented.  No agitation.    Antiphospholipid Syndrome  SLE  Poor Med Adherence: Patient has tried multiple anticoagulants including warfarin, eliquis, xarelto, but struggles w/ med adherence.  Re-emphasized compliance to medications. Per above, initial plan was for coumadin as outpatient, however given logistical issues, will now plan eliquis with 81mg  aspirin once INR becomes subtherapautic   Subjective: No complaints today  Physical Exam: Vitals:   04/17/23 2356 04/18/23 0300 04/18/23 0857 04/18/23 1048  BP: 125/80 (!) 133/98 117/73 (!) 149/99  Pulse: 74 79 72 72  Resp: 16 16 20 20   Temp: 98.2 F (36.8 C) 97.7 F (36.5 C) 97.8 F (36.6 C) 98.2 F (36.8 C)  TempSrc: Oral Oral Oral Oral  SpO2: 97% 97% 99% 100%  Weight:      Height:       General exam: Conversant,  in no acute distress Respiratory system: normal chest rise, clear, no audible wheezing Cardiovascular  system: regular rhythm, s1-s2 Gastrointestinal system: Nondistended, nontender, pos BS Central nervous system: No seizures, no tremors Extremities: No cyanosis, no joint deformities Skin: No rashes, no pallor Psychiatry: Affect normal // no auditory hallucinations   Data Reviewed:  Labs reviewed: Na 132, K 4.3, WBC 7.0, Hgb 9.9, Plts 158  Family Communication: Pt in room, family not at bedside  Disposition: Status is: Inpatient Remains inpatient appropriate because: severity of illness  Planned Discharge Destination: Home    Author: Rickey Barbara, MD 04/18/2023 4:11 PM  For on call review [email or web address].

## 2023-04-18 NOTE — Progress Notes (Addendum)
Advised by case management staff that pt should d/c to home today. Contacted [facility] to advise clinic of pt's d/c today and that pt should resume care tomorrow. Update provided to renal NP as well.   Olivia Canter Renal Navigator [phone]  Addendum at 4:36 pm: Advised clinic that it appears that pt will not be able to be d/c today as hoped and pt will not be at clinic tomorrow.

## 2023-04-18 NOTE — Progress Notes (Signed)
    Durable Medical Equipment  (From admission, onward)           Start     Ordered   04/17/23 1558  For home use only DME Bedside commode  Once       Question Answer Comment  Patient needs a bedside commode to treat with the following condition ESRD (end stage renal disease) on dialysis [facility])   Patient needs a bedside commode to treat with the following condition CVA (cerebral vascular accident) (HCC)      04/17/23 1559            The patient is confined to one level of the home environment and there is no toilet on the that level of the home.

## 2023-04-18 NOTE — Progress Notes (Addendum)
[facility] Progress Note   Subjective:    Seen and examined patient at bedside. Tolerated yesterday's HD with net UF 3L. He was denied SNF placement and supposed to be going home; however, informed by Primary of having difficulty setting up outpatient coumadin visits. Patient feels well so hopefully this can get sorted out so he can go home today. Next HD 11/1 only if he is still here.  Objective Vitals:   04/17/23 2356 04/18/23 0300 04/18/23 0857 04/18/23 1048  BP: 125/80 (!) 133/98 117/73 (!) 149/99  Pulse: 74 79 72 72  Resp: 16 16 20 20   Temp: 98.2 F (36.8 C) 97.7 F (36.5 C) 97.8 F (36.6 C) 98.2 F (36.8 C)  TempSrc: Oral Oral Oral Oral  SpO2: 97% 97% 99% 100%  Weight:      Height:       Physical Exam General: Awake; Alert; NAD; on RA Heart:S1 and S2; MRGs Lungs:Clear throughout Abdomen:Soft and non-tender Extremities:No LE edema Dialysis Access: Right IJ TDC, nonfunctional lue avf    Filed Weights   04/16/23 0500 04/17/23 0938 04/17/23 1355  Weight: 108.8 kg 110.3 kg 107 kg    Intake/Output Summary (Last 24 hours) at 04/18/2023 1505 Last data filed at 04/18/2023 0600 Gross per 24 hour  Intake 240 ml  Output --  Net 240 ml    Additional Objective Labs: Basic Metabolic Panel: Recent Labs  Lab 04/13/23 0645 04/15/23 0458 04/17/23 0643 04/17/23 0644  NA 137 135 136 132*  K 4.7 4.2 4.5 4.3  CL 98 96* 98 99  CO2 23 23 21* 21*  GLUCOSE 87 84 86 84  BUN 85* 81* 74* 73*  CREATININE 16.04* 14.57* 13.57* 13.61*  CALCIUM 8.4* 8.9 8.5* 8.2*  PHOS 8.5*  --  7.2* 7.0*   Liver Function Tests: Recent Labs  Lab 04/13/23 0645 04/17/23 0643  ALBUMIN 3.3* 3.3*   No results for input(s): "LIPASE", "AMYLASE" in the last 168 hours. CBC: Recent Labs  Lab 04/13/23 0645 04/15/23 0458 04/15/23 1335 04/17/23 0644  WBC 7.3  --  7.4 7.0  HGB 10.4* 10.4* 10.2* 9.9*  HCT 32.1* 32.4* 31.9* 30.4*  MCV 93.3  --  94.7 91.3  PLT 208  --  199 158    Blood Culture    Component Value Date/Time   SDES BLOOD RIGHT HAND 01/19/2023 0213   SPECREQUEST  01/19/2023 0981    BOTTLES DRAWN AEROBIC AND ANAEROBIC Blood Culture results may not be optimal due to an excessive volume of blood received in culture bottles   CULT  01/19/2023 0213    NO GROWTH 5 DAYS Performed at [facility], [address] [place], [place] [address]    REPTSTATUS 01/24/2023 FINAL 01/19/2023 8295    Cardiac Enzymes: No results for input(s): "CKTOTAL", "CKMB", "CKMBINDEX", "TROPONINI" in the last 168 hours. CBG: Recent Labs  Lab 04/13/23 1129  GLUCAP 104*   Iron Studies: No results for input(s): "IRON", "TIBC", "TRANSFERRIN", "FERRITIN" in the last 72 hours. Lab Results  Component Value Date   INR 2.7 (H) 04/18/2023   INR 3.1 (H) 04/17/2023   INR 3.3 (H) 04/16/2023   PROTIME 28.8 (H) 11/04/2013   PROTIME 21.6 (H) 10/14/2013   PROTIME 18.0 (H) 09/23/2013   Studies/Results: No results found.  Medications:   atorvastatin  40 mg Oral Daily   Chlorhexidine Gluconate Cloth  6 each Topical Q0600   cinacalcet  60 mg Oral Q supper   darbepoetin (ARANESP) injection - DIALYSIS  60 mcg Subcutaneous Q Wed-1800   heparin sodium (porcine)  3,200 Units Intracatheter Once   levETIRAcetam  500 mg Oral BID   sevelamer carbonate  3,200 mg Oral TID WC    Dialysis Orders: [facility] MWF  4h   400/800   101.5kg   2/2 bath   [facility] TDC    Heparin none - last OP HD today 10/9, post wt 110.5kg (9kg over) - mircera 50 mcg IV q 2wks, last 10/2, due 10/16  - venofer 100mg  three times per week thru today - rocaltrol 2.0 mcg   Assessment/Plan: Seizures - acute on chronic, takes Keppra at home but misses doses. MRI with scattered infarcts - neurology implicates subtherapeutic INR.   AC Therapy - informed by Primary of having difficulty setting up outpatient coumadin visits for the patient. Per Dr. Marisue Humble, the Nephrology department will not be involved with patient's  coumadin management, including lab draws. Recommend an alternative AC therapy.Marland KitchenMarland Kitchenperhaps Eliquis? Managed by Primary.   H/o antiphospholipid syndrome - hx SLE, has been tried on multiple anticoagulants in the past but struggles w/ medication adherence.  On coumadin    ESRD - on HD MWF. Pre-HD standing weight 10/30 was 110.3kg. Chronic issue with noncompliance with HD as outpatient. Removed 3L yesterday. Push UF here as tolerated. Next HD 11/1 only if he is still here.   HTN/volume - UF with HD as tolerated, continue fluid restriction.   Anemia of ESRD - Hb now 9.9, ESA given on 04/17/23.   CKD-MBD - CCa normalized, and phos is trending down. Continue binder and sensipar. Renal diet.   Dispo: Informed patient was denied SNF placement so the plan is for him to go home. Primary is working out his [facility] therapy in outpatient. Hopeful this can be sorted out so he can go home today. Okay for dc from a renal standpoint.  Salome Holmes, NP [facility] 04/18/2023,3:05 PM  LOS: 22 days

## 2023-04-19 ENCOUNTER — Other Ambulatory Visit (HOSPITAL_COMMUNITY): Payer: Self-pay

## 2023-04-19 DIAGNOSIS — R569 Unspecified convulsions: Secondary | ICD-10-CM | POA: Diagnosis not present

## 2023-04-19 DIAGNOSIS — R4182 Altered mental status, unspecified: Secondary | ICD-10-CM | POA: Diagnosis not present

## 2023-04-19 DIAGNOSIS — R0902 Hypoxemia: Secondary | ICD-10-CM | POA: Diagnosis not present

## 2023-04-19 LAB — CBC
HCT: 31.1 % — ABNORMAL LOW (ref 39.0–52.0)
Hemoglobin: 10.1 g/dL — ABNORMAL LOW (ref 13.0–17.0)
MCH: 29.9 pg (ref 26.0–34.0)
MCHC: 32.5 g/dL (ref 30.0–36.0)
MCV: 92 fL (ref 80.0–100.0)
Platelets: 164 10*3/uL (ref 150–400)
RBC: 3.38 MIL/uL — ABNORMAL LOW (ref 4.22–5.81)
RDW: 13.8 % (ref 11.5–15.5)
WBC: 6.6 10*3/uL (ref 4.0–10.5)
nRBC: 0 % (ref 0.0–0.2)

## 2023-04-19 LAB — PROTIME-INR
INR: 2.6 — ABNORMAL HIGH (ref 0.8–1.2)
Prothrombin Time: 28.4 s — ABNORMAL HIGH (ref 11.4–15.2)

## 2023-04-19 MED ORDER — CHLORHEXIDINE GLUCONATE CLOTH 2 % EX PADS
6.0000 | MEDICATED_PAD | Freq: Every day | CUTANEOUS | Status: DC
  Administered 2023-04-20: 6 via TOPICAL
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
## 2023-04-19 NOTE — TOC Progression Note (Signed)
Transition of Care [facility]) - Progression Note    Patient Details  Name: [name] [name] MRN: [id number] Date of Birth: 10-Oct-1980  Transition of Care [facility]) CM/SW Contact  Baldemar Lenis, [place] Phone Number: 04/19/2023, 11:31 AM  Clinical Narrative:   CSW met with patient to discuss discharge home. Patient doesn't get paid until tomorrow, but he could figure out a place to stay tonight if he needed to. Patient agreeable to home health, referral given to [facility]. 3N1 ordered with [employer]. CSW notified RNCM about need for coumadin clinic checks for outpatient follow up.     Expected Discharge Plan: Home w Home Health Services Barriers to Discharge: Continued Medical Work up  Expected Discharge Plan and Services                                               Social Determinants of Health (SDOH) Interventions SDOH Screenings   Food Insecurity: No Food Insecurity (04/01/2023)  Housing: Low Risk  (04/01/2023)  Transportation Needs: No Transportation Needs (04/01/2023)  Utilities: Not At Risk (04/01/2023)  Alcohol Screen: Low Risk  (03/26/2023)  Depression (PHQ2-9): Low Risk  (03/26/2023)  Financial Resource Strain: Low Risk  (03/26/2023)  Physical Activity: Inactive (03/26/2023)  Social Connections: Moderately Isolated (03/26/2023)  Stress: No Stress Concern Present (03/26/2023)  Tobacco Use: Unknown (04/01/2023)  Health Literacy: Adequate Health Literacy (03/26/2023)    Readmission Risk Interventions    03/28/2023    2:57 PM 01/25/2023    3:40 PM  Readmission Risk Prevention Plan  Post Dischage Appt    Medication Screening    Transportation Screening Complete   Medication Review (RN Care Manager) Referral to Pharmacy   PCP or Specialist appointment within 3-5 days of discharge Complete   HRI or Home Care Consult Complete   SW Recovery Care/Counseling Consult Complete   Palliative Care Screening Not Applicable   Skilled Nursing Facility Not Applicable       Information is confidential and restricted. Go to Review Flowsheets to unlock data.

## 2023-04-19 NOTE — TOC Progression Note (Signed)
Transition of Care [facility]) - Progression Note    Patient Details  Name: [name] [name] MRN: [id number] Date of Birth: 1981-03-17  Transition of Care [facility]) CM/SW Contact  Baldemar Lenis, [place] Phone Number: 04/19/2023, 11:23 AM  Clinical Narrative:   CSW notified by insurance that peer to peer is requested for patient for SNF authorization. CSW sent to MD, MD completed and patient was denied due to being too high functioning. CSW notified team, patient will have to go home. CSW to follow.    Expected Discharge Plan: Home/Self Care    Expected Discharge Plan and Services                                               Social Determinants of Health (SDOH) Interventions SDOH Screenings   Food Insecurity: No Food Insecurity (04/01/2023)  Housing: Low Risk  (04/01/2023)  Transportation Needs: No Transportation Needs (04/01/2023)  Utilities: Not At Risk (04/01/2023)  Alcohol Screen: Low Risk  (03/26/2023)  Depression (PHQ2-9): Low Risk  (03/26/2023)  Financial Resource Strain: Low Risk  (03/26/2023)  Physical Activity: Inactive (03/26/2023)  Social Connections: Moderately Isolated (03/26/2023)  Stress: No Stress Concern Present (03/26/2023)  Tobacco Use: Unknown (04/01/2023)  Health Literacy: Adequate Health Literacy (03/26/2023)    Readmission Risk Interventions    03/28/2023    2:57 PM 01/25/2023    3:40 PM  Readmission Risk Prevention Plan  Post Dischage Appt    Medication Screening    Transportation Screening Complete   Medication Review (RN Care Manager) Referral to Pharmacy   PCP or Specialist appointment within 3-5 days of discharge Complete   HRI or Home Care Consult Complete   SW Recovery Care/Counseling Consult Complete   Palliative Care Screening Not Applicable   Skilled Nursing Facility Not Applicable      Information is confidential and restricted. Go to Review Flowsheets to unlock data.

## 2023-04-19 NOTE — Progress Notes (Signed)
Progress Note   Patient: [name] [name]:[id number] DOB: 1980-06-20 DOA: 03/27/2023     23 DOS: the patient was seen and examined on 04/19/2023   Brief hospital course: 42 years old male with PMH significant for essential hypertension, ESRD on hemodialysis, SLE with APLA previously on Eliquis, CVA, chronic thrombocytopenia, anemia of chronic disease, history of seizures, history of medication noncompliance presented to the ED from dialysis for not acting himself during dialysis.  Patient was found to have active seizures in the ER with tongue biting and was hypoxic with SpO2 of 60% on room air which improved to 100% on nonrebreather and was treated with IV Ativan.  Patient remained postictal in the ER occasionally agitated.He was loaded with Keppra.  Neurology was consulted.  CT head without acute bleed but possibly shows new stroke.  Patient had multiple ER visits for breakthrough seizures.  MRI showed acute infarct of the left PCA territory, no hemorrhage or mass effect.  Patient was admitted in the ICU. TRH pickup 03/29/2023  Assessment and Plan: Seizure with postictal encephalopathy / History of seizures: History of complex partial seizures. Multiple ED visits for breakthrough seizures. Questionable compliance-reportedly misses about 3-4 Doses of Keppra per week. Admitted for seizure and aspiration pneumonitis.  Received Ativan and Keppra load EEG showed moderate to diffuse encephalopathy, No evidence of seizures or epileptiform discharges. Continue Keppra 500 Mg twice daily. Seizure precautions. No further seizures reported.   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 Prior left PCA infarct in August 2024. Had been discharged on Eliquis and Lipitor. MRI showed multiple acute and subacute infarcts.  Per neurology, likely secondary to antiphospholipid syndrome on Coumadin with subtherapeutic INR and noncompliance. There are concerns of noncompliance to eliquis  and to warfarin PTA Pt had been initially continued on coumadin with plan for outpt coumadin clinic There are multiple logistical issues precluding pt safely discharging on coumadin and following INR as outpatient -Recently discussed with Stroke MD. Recommendation now to transition back to eliquis with 81mg  aspirin once INR becomes subtherapeutic. Pharmacy is following   Acute hypoxic respiratory failure: Likely multifactorial. Resolved.   ESRD on MWF HD/hyperkalemia Nephrology following for HD needs. Stable. On MWF HD.   Metabolic bone disease Significant hyperphosphatemia >30.   Nephrology resuming binder and Sensipar.    Essential hypertension: Controlled on as needed hydralazine.   Hyperlipidemia: Continue Lipitor   Anemia in ESRD: Stable.   Thrombocytopenia Appears new compared to August 2024.  Resolved.   Acute encephalopathy: > Resolved. Noted on 10/16. CT head without acute findings.  EEG suggestive of moderate diffuse encephalopathy.  No seizures or epileptiform discharges. Alert and oriented.  No agitation.    Antiphospholipid Syndrome  SLE  Poor Med Adherence: Patient has tried multiple anticoagulants including warfarin, eliquis, xarelto, but struggles w/ med adherence.  Re-emphasized compliance to medications. Per above, initial plan was for coumadin as outpatient, however given logistical issues, will now plan eliquis with 81mg  aspirin once INR becomes subtherapautic   Subjective: Very eager to go home  Physical Exam: Vitals:   04/19/23 0610 04/19/23 0745 04/19/23 1134 04/19/23 1553  BP:  120/81 (!) 134/99 (!) 139/99  Pulse:  74 65 80  Resp:  18 18 18   Temp:  98.6 F (37 C) 97.8 F (36.6 C) 98 F (36.7 C)  TempSrc:  Oral Oral Oral  SpO2:  99% 96% 100%  Weight: 113.1 kg     Height:       General  exam: Awake, laying in bed, in nad Respiratory system: Normal respiratory effort, no wheezing Cardiovascular system: regular rate, s1,  s2 Gastrointestinal system: Soft, nondistended, positive BS Central nervous system: CN2-12 grossly intact, strength intact Extremities: Perfused, no clubbing Skin: Normal skin turgor, no notable skin lesions seen Psychiatry: Mood normal // no visual hallucinations   Data Reviewed:  Labs reviewed: WBC 6.6, Hgb 10.1, Plts 164  Family Communication: Pt in room, family not at bedside  Disposition: Status is: Inpatient Remains inpatient appropriate because: severity of illness  Planned Discharge Destination: Home    Author: Rickey Barbara, MD 04/19/2023 4:45 PM  For on call review [email or web address].

## 2023-04-19 NOTE — Progress Notes (Signed)
 [facility] Progress Note   Subjective: Still in hospital awaiting INR to go down to resume eliquis. Was supposed to go home today however was not discharged in time to go to OP clinic. HD tomorrow on schedule  Objective Vitals:   04/19/23 0610 04/19/23 0745 04/19/23 1134 04/19/23 1553  BP:  120/81 (!) 134/99 (!) 139/99  Pulse:  74 65 80  Resp:  18 18 18   Temp:  98.6 F (37 C) 97.8 F (36.6 C) 98 F (36.7 C)  TempSrc:  Oral Oral Oral  SpO2:  99% 96% 100%  Weight: 113.1 kg     Height:       Physical Exam General: Chronically ill appearing male in NAD Heart: S1,S2 RRR No M/R/G Lungs:CTAB Abdomen: NABS, NT Extremities: No LE edema Dialysis Access: RIJ TDC Drsg intact.   Additional Objective Labs: Basic Metabolic Panel: Recent Labs  Lab 04/13/23 0645 04/15/23 0458 04/17/23 0643 04/17/23 0644  NA 137 135 136 132*  K 4.7 4.2 4.5 4.3  CL 98 96* 98 99  CO2 23 23 21* 21*  GLUCOSE 87 84 86 84  BUN 85* 81* 74* 73*  CREATININE 16.04* 14.57* 13.57* 13.61*  CALCIUM 8.4* 8.9 8.5* 8.2*  PHOS 8.5*  --  7.2* 7.0*   Liver Function Tests: Recent Labs  Lab 04/13/23 0645 04/17/23 0643  ALBUMIN 3.3* 3.3*   No results for input(s): "LIPASE", "AMYLASE" in the last 168 hours. CBC: Recent Labs  Lab 04/13/23 0645 04/15/23 0458 04/15/23 1335 04/17/23 0644 04/19/23 0529  WBC 7.3  --  7.4 7.0 6.6  HGB 10.4*   < > 10.2* 9.9* 10.1*  HCT 32.1*   < > 31.9* 30.4* 31.1*  MCV 93.3  --  94.7 91.3 92.0  PLT 208  --  199 158 164   < > = values in this interval not displayed.   Blood Culture    Component Value Date/Time   SDES BLOOD RIGHT HAND 01/19/2023 0213   SPECREQUEST  01/19/2023 2440    BOTTLES DRAWN AEROBIC AND ANAEROBIC Blood Culture results may not be optimal due to an excessive volume of blood received in culture bottles   CULT  01/19/2023 0213    NO GROWTH 5 DAYS Performed at [facility], [address], [place], [place] [address]     REPTSTATUS 01/24/2023 FINAL 01/19/2023 5366    Cardiac Enzymes: No results for input(s): "CKTOTAL", "CKMB", "CKMBINDEX", "TROPONINI" in the last 168 hours. CBG: Recent Labs  Lab 04/13/23 1129  GLUCAP 104*   Iron Studies: No results for input(s): "IRON", "TIBC", "TRANSFERRIN", "FERRITIN" in the last 72 hours. @lablastinr3 @ Studies/Results: No results found. Medications:   atorvastatin  40 mg Oral Daily   [START ON 04/20/2023] Chlorhexidine Gluconate Cloth  6 each Topical Q0600   cinacalcet  60 mg Oral Q supper   darbepoetin (ARANESP) injection - DIALYSIS  60 mcg Subcutaneous Q Wed-1800   heparin sodium (porcine)  3,200 Units Intracatheter Once   levETIRAcetam  500 mg Oral BID   sevelamer carbonate  3,200 mg Oral TID WC     Dialysis Orders: [facility] MWF  4h   400/800   101.5kg   2/2 bath   RIJ TDC    Heparin none - last OP HD today 10/9, post wt 110.5kg (9kg over) - mircera 50 mcg IV q 2wks, last 10/2, due 10/16  - venofer 100mg  three times per week thru today - rocaltrol 2.0 mcg    Assessment/Plan: Seizures -  acute on chronic, takes Keppra at home but misses doses. MRI with scattered infarcts - neurology implicates subtherapeutic INR.    AC Therapy - informed by Primary of having difficulty setting up outpatient coumadin visits for the patient. Per Dr. Marisue Humble, the Nephrology department will not be involved with patient's coumadin management, including lab draws. Recommend an alternative AC therapy.Marland KitchenMarland Kitchenperhaps Eliquis? Managed by Primary.   H/o antiphospholipid syndrome - hx SLE, has been tried on multiple anticoagulants in the past but struggles w/ medication adherence.  On coumadin    ESRD - on HD MWF. Pre-HD standing weight 10/30 was 110.3kg. Chronic issue with noncompliance with HD as outpatient. Removed 3L yesterday. Push UF here as tolerated. Next HD 04/19/2023   HTN/volume - UF with HD as tolerated, continue fluid restriction.   Anemia of ESRD - Hb now 9.9, ESA given on  04/17/23.   CKD-MBD - CCa normalized, and phos is trending down. Continue binder and sensipar. Renal diet.   Dispo: Informed patient was denied SNF placement so the plan is for him to go home. Primary is working out his [facility] therapy in outpatient. Hopeful this can be sorted out so he can go home today. Okay for dc from a renal standpoint.    Tylee Newby H. Reve Crocket NP-C 04/19/2023, 5:04 PM  [employer] [phone]

## 2023-04-19 NOTE — Plan of Care (Signed)
  Problem: Education: Goal: Knowledge of disease or condition will improve Outcome: Progressing   Problem: Coping: Goal: Will verbalize positive feelings about self Outcome: Progressing Goal: Will identify appropriate support needs Outcome: Progressing   Problem: Nutrition: Goal: Risk of aspiration will decrease Outcome: Progressing   Problem: Education: Goal: Knowledge of General Education information will improve Description: Including pain rating scale, medication(s)/side effects and non-pharmacologic comfort measures Outcome: Progressing

## 2023-04-19 NOTE — Progress Notes (Signed)
Speech Language Pathology Treatment: Cognitive-Linquistic  Patient Details Name: LORIK GUO MRN: [id number] DOB: [birth year] Today's Date: 04/19/2023 Time: 9562-1308 SLP Time Calculation (min) (ACUTE ONLY): 13 min  Assessment / Plan / Recommendation Clinical Impression  SLP saw pt for f/u as plans have changed to d/c home. Pt showed appropriate safety awareness when discussing precautions to take upon discharge. He shared his use of alarms on his phone as a memory aid even PTA. SLP offered additional memory strategies including additional ways to utilize his phone for memory. Pt also participated in verbal problem solving task with Mod I. He shows more executive functions today too, acknowledging that his d/c may be postponed and planning ahead to call and reschedule an appointment that he had made. Recommend that he discharge with [facility] SLP f/u and assistance from friends.    HPI HPI: Pt is a 42 yo male presenting 10/9 from dialysis with AMS.  Patient was found to have active seizures in the ER with tongue biting and was hypoxic requiring NRB. MRI 10/12 showed scattered foci of acute and subacute infarction throughout both cerebral hemispheres, including the bilateral ACA, MCA and PCA territories. PMH includes: CVA (L PCA, August 2024; SLP eval at that time with severe cognitive impairment with concern for acute on chronic decline), HTN, ESRD on HD, SLE with APLA, chronic thrombocytopenia, anemia of chronic disease, history of seizures, history of medication noncompliance      SLP Plan  Continue with current plan of care      Recommendations for follow up therapy are one component of a multi-disciplinary discharge planning process, led by the attending physician.  Recommendations may be updated based on patient status, additional functional criteria and insurance authorization.    Recommendations                         Frequent or constant Supervision/Assistance Cognitive  communication deficit (R41.841)     Continue with current plan of care     Mahala Menghini., M.A. CCC-SLP Acute Rehabilitation Services Office [phone]  Secure chat preferred   04/19/2023, 4:18 PM
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
## 2023-04-19 NOTE — Progress Notes (Signed)
PHARMACY - ANTICOAGULATION CONSULT NOTE  Pharmacy Consult for warfarin Indication:  APLA syndrome  Allergies  Allergen Reactions   Zyrtec [Cetirizine] Swelling    FACIAL SWELLING, NOT CATEGORIZED   Cetirizine & Related Swelling    Patient Measurements: Height: 6\' 1"  (185.4 cm) Weight: 113.1 kg (249 lb 5.4 oz) IBW/kg (Calculated) : 79.9 Heparin Dosing Weight: 103 kg   Vital Signs: Temp: 98.6 F (37 C) (11/01 0745) Temp Source: Oral (11/01 0745) BP: 120/81 (11/01 0745) Pulse Rate: 74 (11/01 0745)  Labs: Recent Labs    04/17/23 0643 04/17/23 0644 04/18/23 0714 04/19/23 0529  HGB  --  9.9*  --  10.1*  HCT  --  30.4*  --  31.1*  PLT  --  158  --  164  LABPROT  --  32.5* 29.0* 28.4*  INR  --  3.1* 2.7* 2.6*  CREATININE 13.57* 13.61*  --   --    Estimated Creatinine Clearance: 9.2 mL/min (A) (by C-G formula based on SCr of 13.61 mg/dL (H)).  Medical History: Past Medical History:  Diagnosis Date   ESRD on hemodialysis (HCC)    Hypertension    Lupus    Seizures (HCC)    Seizures (HCC)    Stroke (HCC)    89 or 42 years of age.     Wears glasses    Assessment: 42 yo M presented with seizures with HD. Patient has history antiphospholipid syndrome, leading to hypercoagulable state and causing hx of strokes. Decision made to switch warfarin to apixaban with addition of bASA for APLS d/t lack of outpatient follow-up for warfarin/NR checks. Will plan to start Eliquis/bASA when INR is below goal. Copay for eliquis is $4. Patient will require education prior to discharge.   11/1 AM: INR 2.6 - therapeutic following held warfarin yesterday, anticipate apixaban and aspirin combination will be able to be restarted tomorrow or the day following.   Goal of Therapy:  Monitor platelets by anticoagulation protocol: Yes   Plan:  Hold anticoagulation  Check INR daily for warfarin to apixaban switch  Continue to monitor H&H and platelets   Thank you for allowing pharmacy to be a  part of this patient's care.  Jani Gravel, PharmD Clinical Pharmacist  04/19/2023 8:56 AM

## 2023-04-20 ENCOUNTER — Other Ambulatory Visit (HOSPITAL_COMMUNITY): Payer: Self-pay

## 2023-04-20 ENCOUNTER — Encounter (HOSPITAL_COMMUNITY): Payer: Self-pay

## 2023-04-20 DIAGNOSIS — R0902 Hypoxemia: Secondary | ICD-10-CM | POA: Diagnosis not present

## 2023-04-20 DIAGNOSIS — R569 Unspecified convulsions: Secondary | ICD-10-CM | POA: Diagnosis not present

## 2023-04-20 DIAGNOSIS — R4182 Altered mental status, unspecified: Secondary | ICD-10-CM | POA: Diagnosis not present

## 2023-04-20 LAB — CBC
HCT: 30.5 % — ABNORMAL LOW (ref 39.0–52.0)
Hemoglobin: 9.8 g/dL — ABNORMAL LOW (ref 13.0–17.0)
MCH: 29.3 pg (ref 26.0–34.0)
MCHC: 32.1 g/dL (ref 30.0–36.0)
MCV: 91 fL (ref 80.0–100.0)
Platelets: 158 10*3/uL (ref 150–400)
RBC: 3.35 MIL/uL — ABNORMAL LOW (ref 4.22–5.81)
RDW: 13.9 % (ref 11.5–15.5)
WBC: 7.4 10*3/uL (ref 4.0–10.5)
nRBC: 0 % (ref 0.0–0.2)

## 2023-04-20 LAB — RENAL FUNCTION PANEL
Albumin: 3.3 g/dL — ABNORMAL LOW (ref 3.5–5.0)
Anion gap: 17 — ABNORMAL HIGH (ref 5–15)
BUN: 91 mg/dL — ABNORMAL HIGH (ref 6–20)
CO2: 20 mmol/L — ABNORMAL LOW (ref 22–32)
Calcium: 7.8 mg/dL — ABNORMAL LOW (ref 8.9–10.3)
Chloride: 97 mmol/L — ABNORMAL LOW (ref 98–111)
Creatinine, Ser: 16.22 mg/dL — ABNORMAL HIGH (ref 0.61–1.24)
GFR, Estimated: 3 mL/min — ABNORMAL LOW (ref >60)
Glucose, Bld: 103 mg/dL — ABNORMAL HIGH (ref 70–99)
Phosphorus: 7.8 mg/dL — ABNORMAL HIGH (ref 2.5–4.6)
Potassium: 4.4 mmol/L (ref 3.5–5.1)
Sodium: 134 mmol/L — ABNORMAL LOW (ref 135–145)

## 2023-04-20 LAB — PROTIME-INR
INR: 2.5 — ABNORMAL HIGH (ref 0.8–1.2)
Prothrombin Time: 27.4 s — ABNORMAL HIGH (ref 11.4–15.2)

## 2023-04-20 MED ORDER — NEPRO/CARBSTEADY PO LIQD: 237.0000 mL | ORAL | Status: DC | PRN

## 2023-04-20 MED ORDER — HEPARIN SODIUM (PORCINE) 1000 UNIT/ML DIALYSIS
1000.0000 [IU] | INTRAMUSCULAR | Status: DC | PRN
  Filled 2023-04-20: qty 1

## 2023-04-20 MED ORDER — APIXABAN 5 MG PO TABS
5.0000 mg | ORAL_TABLET | Freq: Two times a day (BID) | ORAL | 0 refills | Status: DC
  Filled 2023-04-20: qty 60, 30d supply, fill #0

## 2023-04-20 MED ORDER — DIPHENHYDRAMINE HCL 50 MG/ML IJ SOLN: 25.0000 mg | Freq: Once | INTRAMUSCULAR | Status: DC

## 2023-04-20 MED ORDER — ALTEPLASE 2 MG IJ SOLR: 2.0000 mg | Freq: Once | INTRAMUSCULAR | Status: DC | PRN

## 2023-04-20 MED ORDER — PENTAFLUOROPROP-TETRAFLUOROETH EX AERO: 1.0000 | INHALATION_SPRAY | CUTANEOUS | Status: DC | PRN

## 2023-04-20 MED ORDER — LIDOCAINE HCL (PF) 1 % IJ SOLN: 5.0000 mL | INTRAMUSCULAR | Status: DC | PRN

## 2023-04-20 MED ORDER — ANTICOAGULANT SODIUM CITRATE 4% (200MG/5ML) IV SOLN: 5.0000 mL | Status: DC | PRN

## 2023-04-20 MED ORDER — LIDOCAINE-PRILOCAINE 2.5-2.5 % EX CREA: 1.0000 | TOPICAL_CREAM | CUTANEOUS | Status: DC | PRN

## 2023-04-20 MED ORDER — DIPHENHYDRAMINE HCL 25 MG PO CAPS
25.0000 mg | ORAL_CAPSULE | Freq: Once | ORAL | Status: AC
  Administered 2023-04-20: 25 mg via ORAL
  Filled 2023-04-20: qty 1

## 2023-04-20 MED ORDER — ASPIRIN 81 MG PO TBEC
81.0000 mg | DELAYED_RELEASE_TABLET | Freq: Every day | ORAL | 1 refills | Status: AC
  Filled 2023-04-20: qty 30, 30d supply, fill #0

## 2023-04-20 NOTE — TOC Transition Note (Signed)
Transition of Care [facility]) - CM/SW Discharge Note   Patient Details  Name: [name] [name] MRN: [id number] Date of Birth: 04/08/1981  Transition of Care [facility] CM/SW Contact:  Lawerance Sabal, RN Phone Number: 04/20/2023, 2:50 PM   Clinical Narrative:     Notified HH liaison of DC     Barriers to Discharge: Continued Medical Work up   Patient Goals and CMS Choice      Discharge Placement                         Discharge Plan and Services Additional resources added to the After Visit Summary for                                       Social Determinants of Health (SDOH) Interventions SDOH Screenings   Food Insecurity: No Food Insecurity (04/01/2023)  Housing: Low Risk  (04/01/2023)  Transportation Needs: No Transportation Needs (04/01/2023)  Utilities: Not At Risk (04/01/2023)  Alcohol Screen: Low Risk  (03/26/2023)  Depression (PHQ2-9): Low Risk  (03/26/2023)  Financial Resource Strain: Low Risk  (03/26/2023)  Physical Activity: Inactive (03/26/2023)  Social Connections: Moderately Isolated (03/26/2023)  Stress: No Stress Concern Present (03/26/2023)  Tobacco Use: Unknown (04/01/2023)  Health Literacy: Adequate Health Literacy (03/26/2023)     Readmission Risk Interventions    03/28/2023    2:57 PM 01/25/2023    3:40 PM  Readmission Risk Prevention Plan  Post Dischage Appt    Medication Screening    Transportation Screening Complete   Medication Review (RN Care Manager) Referral to Pharmacy   PCP or Specialist appointment within 3-5 days of discharge Complete   HRI or Home Care Consult Complete   SW Recovery Care/Counseling Consult Complete   Palliative Care Screening Not Applicable   Skilled Nursing Facility Not Applicable      Information is confidential and restricted. Go to Review Flowsheets to unlock data.

## 2023-04-20 NOTE — Plan of Care (Signed)

## 2023-04-20 NOTE — Discharge Summary (Signed)
Physician Discharge Summary   Patient: [name] [name] MRN: [id number] DOB: [birth year]  Admit date:     03/27/2023  Discharge date: 04/20/23  Discharge Physician: Rickey Barbara   PCP: Claiborne Rigg, NP   Recommendations at discharge:    Follow up with PCP in 1-2 weeks Follow up with scheduled HD  Discharge Diagnoses: Principal Problem:   Seizure [facility]) Active Problems:   Cerebrovascular accident (CVA) due to embolism of cerebral artery (HCC)  Resolved Problems:   * No resolved hospital problems. *  Hospital Course: 41 years old male with PMH significant for essential hypertension, ESRD on hemodialysis, SLE with APLA previously on Eliquis, CVA, chronic thrombocytopenia, anemia of chronic disease, history of seizures, history of medication noncompliance presented to the ED from dialysis for not acting himself during dialysis.  Patient was found to have active seizures in the ER with tongue biting and was hypoxic with SpO2 of 60% on room air which improved to 100% on nonrebreather and was treated with IV Ativan.  Patient remained postictal in the ER occasionally agitated.He was loaded with Keppra.  Neurology was consulted.  CT head without acute bleed but possibly shows new stroke.  Patient had multiple ER visits for breakthrough seizures.  MRI showed acute infarct of the left PCA territory, no hemorrhage or mass effect.  Patient was admitted in the ICU. TRH pickup 03/29/2023  Assessment and Plan: Seizure with postictal encephalopathy / History of seizures: History of complex partial seizures. Multiple ED visits for breakthrough seizures. Questionable compliance-reportedly misses about 3-4 Doses of Keppra per week. Admitted for seizure and aspiration pneumonitis.  Received Ativan and Keppra load EEG showed moderate to diffuse encephalopathy, No evidence of seizures or epileptiform discharges. Continue Keppra 500 Mg twice daily. Seizure precautions. No further seizures reported.    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 Prior left PCA infarct in August 2024. Had been discharged on Eliquis and Lipitor. MRI showed multiple acute and subacute infarcts.  Per neurology, likely secondary to antiphospholipid syndrome on Coumadin with subtherapeutic INR and noncompliance. There are concerns of noncompliance to eliquis and to warfarin PTA Pt had been initially continued on coumadin with plan for outpt coumadin clinic There are multiple logistical issues precluding pt safely discharging on coumadin and following INR as outpatient -Recently discussed with Stroke MD. Recommendation now to transition back to eliquis with 81mg  aspirin once INR becomes subtherapeutic. INR trended down to 2.5 on day of discharge. Discussed with pharmacy who recommended resuming eliquis with ASA on 11/3. Pt made aware   Acute hypoxic respiratory failure: Likely multifactorial. Resolved.   ESRD on MWF HD/hyperkalemia Nephrology following for HD needs. Stable. On MWF HD.   Metabolic bone disease Significant hyperphosphatemia >30.   Nephrology resuming binder and Sensipar.    Essential hypertension: Controlled on as needed hydralazine.   Hyperlipidemia: Continue Lipitor   Anemia in ESRD: Stable.   Thrombocytopenia Appears new compared to August 2024.  Resolved.   Acute encephalopathy: > Resolved. Noted on 10/16. CT head without acute findings.  EEG suggestive of moderate diffuse encephalopathy.  No seizures or epileptiform discharges. Alert and oriented.  No agitation.    Antiphospholipid Syndrome  SLE  Poor Med Adherence: Patient has tried multiple anticoagulants including warfarin, eliquis, xarelto, but struggles w/ med adherence.  Re-emphasized compliance to medications. Per above, initial plan was for coumadin as outpatient, however given logistical issues, will now plan eliquis with 81mg  aspirin per above    Consultants: Nephrology  Procedures  performed: HD  Disposition: Home Diet recommendation:  Renal diet DISCHARGE MEDICATION: Allergies as of 04/20/2023       Reactions   Zyrtec [cetirizine] Swelling   FACIAL SWELLING, NOT CATEGORIZED   Cetirizine & Related Swelling        Medication List     STOP taking these medications    amLODipine 10 MG tablet Commonly known as: NORVASC   carvedilol 12.5 MG tablet Commonly known as: COREG   midodrine 5 MG tablet Commonly known as: PROAMATINE   rosuvastatin 40 MG tablet Commonly known as: CRESTOR   warfarin 7.5 MG tablet Commonly known as: COUMADIN       TAKE these medications    acetaminophen 325 MG tablet Commonly known as: TYLENOL Take 2 tablets (650 mg total) by mouth every 6 (six) hours as needed for mild pain.   apixaban 5 MG Tabs tablet Commonly known as: ELIQUIS Take 1 tablet (5 mg total) by mouth 2 (two) times daily. Start taking on: April 21, 2023 What changed:  medication strength See the new instructions.   aspirin EC 81 MG tablet Take 1 tablet (81 mg total) by mouth daily. Swallow whole. Start taking on: April 21, 2023   atorvastatin 40 MG tablet Commonly known as: LIPITOR Take 1 tablet (40 mg total) by mouth daily.   cinacalcet 30 MG tablet Commonly known as: SENSIPAR Take 2 tablets (60 mg total) by mouth daily with supper. What changed: Another medication with the same name was removed. Continue taking this medication, and follow the directions you see here.   docusate sodium 100 MG capsule Commonly known as: COLACE Take 1 capsule (100 mg total) by mouth 2 (two) times daily as needed for mild constipation.   levETIRAcetam 500 MG tablet Commonly known as: KEPPRA Take 1 tablet (500 mg total) by mouth 2 (two) times daily. What changed: Another medication with the same name was removed. Continue taking this medication, and follow the directions you see here.   multivitamin Tabs tablet Take 1 tablet by mouth at bedtime.    sevelamer carbonate 800 MG tablet Commonly known as: RENVELA Take 4 tablets (3,200 mg total) by mouth 3 (three) times daily with meals.               Durable Medical Equipment  (From admission, onward)           Start     Ordered   04/17/23 1558  For home use only DME Bedside commode  Once       Question Answer Comment  Patient needs a bedside commode to treat with the following condition ESRD (end stage renal disease) on dialysis [facility])   Patient needs a bedside commode to treat with the following condition CVA (cerebral vascular accident) [facility])      04/17/23 1559            Follow-up Information     Anson Fret, MD. Schedule an appointment as soon as possible for a visit in 1 month(s).   Specialty: Neurology Contact information: [address] [place] [address] [phone]         [facility] Follow up.   Specialty: Home Health Services Why: Representative from [facility] will contact you to schedule start of home health services. Contact information: [address] [place] [address] [phone]         Claiborne Rigg, NP Follow up on 05/08/2023.   Specialty: Nurse Practitioner Why:  10:10 am is your appointment time. please arrive early Contact information: [address] [place] [address] [phone]                Discharge Exam: Ceasar Mons Weights   04/19/23 0610 04/20/23 0600 04/20/23 2956  Weight: 113.1 kg 110 kg 111 kg   General exam: Awake, laying in bed, in nad Respiratory system: Normal respiratory effort, no wheezing Cardiovascular system: regular rate, s1, s2 Gastrointestinal system: Soft, nondistended, positive BS Central nervous system: CN2-12 grossly intact, strength intact Extremities: Perfused, no clubbing Skin: Normal skin turgor, no notable skin lesions seen Psychiatry: Mood normal // no visual hallucinations   Condition at discharge: fair  The results  of significant diagnostics from this hospitalization (including imaging, microbiology, ancillary and laboratory) are listed below for reference.   Imaging Studies: EEG adult  Result Date: 04-10-2023 Charlsie Quest, MD     04/10/2023  8:52 AM Patient Name: [name] [name] MRN: [id number] Epilepsy Attending: Charlsie Quest Referring Physician/Provider: Elease Etienne, MD Date: 04-10-2023 Duration: 21.20 mins Patient history: 42 yo M presented with seizures with HD. Patient has history antiphospholipid syndrome, leading to hypercoagulable state and causing hx of strokes. EEG to evaluate for seizure Level of alertness: Awake AEDs during EEG study: LEV Technical aspects: This EEG study was done with scalp electrodes positioned according to the 10-20 International system of electrode placement. Electrical activity was reviewed with band pass filter of 1-70Hz , sensitivity of 7 uV/mm, display speed of 56mm/sec with a 60Hz  notched filter applied as appropriate. EEG data were recorded continuously and digitally stored.  Video monitoring was available and reviewed as appropriate. Description: The posterior dominant rhythm consists of 8 Hz activity of moderate voltage (25-35 uV) seen predominantly in posterior head regions, symmetric and reactive to eye opening and eye closing. EEG showed continuous generalized 5 to 6 Hz theta slowing admixed with intermittent generalized high amplitude 2-3Hz  delta slowing. Hyperventilation and photic stimulation were not performed.   ABNORMALITY - Intermittent rhythmic delta slow, generalized - Continuous slow, generalized IMPRESSION: This study is suggestive of moderate diffuse encephalopathy. No seizures or epileptiform discharges were seen throughout the recording. Priyanka Annabelle Harman   CT HEAD WO CONTRAST ( )  Result Date: 04/03/2023 CLINICAL DATA:  Hypertension, altered mental status EXAM: CT HEAD WITHOUT CONTRAST TECHNIQUE: Contiguous axial images were obtained from the  base of the skull through the vertex without intravenous contrast. RADIATION DOSE REDUCTION: This exam was performed according to the departmental dose-optimization program which includes automated exposure control, adjustment of the mA and/or kV according to patient size and/or use of iterative reconstruction technique. COMPARISON:  03/27/2023 FINDINGS: Brain: No evidence of acute infarction, hemorrhage, mass, mass effect, or midline shift. No hydrocephalus or extra-axial fluid collection. Redemonstrated chronic left frontoparietal, left parietooccipital, and right parietooccipital encephalomalacia. Chronic infarcts in the left cerebellum. Periventricular white matter changes, likely the sequela of chronic small vessel ischemic disease. Vascular: No hyperdense vessel. Skull: Negative for fracture or focal lesion. Sinuses/Orbits: No acute finding. Other: The mastoid air cells are well aerated. IMPRESSION: No acute intracranial process. Electronically Signed   By: Wiliam Ke M.D.   On: 04/03/2023 20:42   MR BRAIN WO CONTRAST  Result Date: 03/30/2023 CLINICAL DATA:  History of seizures.  Altered mental status. EXAM: MRI HEAD WITHOUT CONTRAST TECHNIQUE: Multiplanar, multiecho pulse sequences of the brain and surrounding structures were obtained without intravenous contrast. COMPARISON:  MRI brain 01/26/2023.  Head CT 03/27/2023. FINDINGS: Brain: Scattered  foci of acute and subacute infarction throughout both cerebral hemispheres, including the bilateral ACA, MCA and PCA territories. No acute hemorrhage or significant mass effect. Interval evolution of the left occipital and left cerebellar infarcts. Unchanged encephalomalacia along the left frontoparietal junction and right frontal operculum. Stable background of severe chronic small-vessel disease. No hydrocephalus or extra-axial collection. Vascular: Normal flow voids. Skull and upper cervical spine: Unchanged diffusely low marrow signal of the upper cervical  spine. Sinuses/Orbits: No acute findings. Other: None. IMPRESSION: 1. Scattered foci of acute and subacute infarction throughout both cerebral hemispheres, including the bilateral ACA, MCA and PCA territories. No acute hemorrhage or significant mass effect. 2. Interval evolution of the left occipital and left cerebellar infarcts. 3. Unchanged diffusely low marrow signal of the upper cervical spine, which can be seen in the setting of anemia and/or renal osteodystrophy. Electronically Signed   By: Orvan Falconer M.D.   On: 03/30/2023 20:56   DG CHEST PORT 1 VIEW  Result Date: 03/28/2023 CLINICAL DATA:  Altered mental status EXAM: PORTABLE CHEST 1 VIEW COMPARISON:  03/27/2023 FINDINGS: Right-sided central venous catheter tip at the right atrium. Cardiomegaly with vascular congestion. Partially visualized left upper extremity metallic stent, present on multiple prior exams including chest radiographs performed prior to an MRI that was done in February of 2024. Negative for unexpected metallic density foreign body IMPRESSION: Cardiomegaly with vascular congestion. Electronically Signed   By: Jasmine Pang M.D.   On: 03/28/2023 20:00   DG Abd 1 View  Result Date: 03/28/2023 CLINICAL DATA:  Altered mental status EXAM: ABDOMEN - 1 VIEW COMPARISON:  11/08/2022 FINDINGS: Nonobstructed gas pattern. Presumed support wires over the upper and mid abdomen. No unexpected metallic density foreign body is seen IMPRESSION: Nonobstructed gas pattern. Negative for unexpected metallic density foreign body Electronically Signed   By: Jasmine Pang M.D.   On: 03/28/2023 19:58   EEG adult  Result Date: 03/28/2023 Charlsie Quest, MD     03/28/2023  7:08 AM Patient Name: [name] [name] MRN: [id number] Epilepsy Attending: Charlsie Quest Referring Physician/Provider: Gordy Councilman, MD Date: 03/28/2023 Duration: 26.53 mins Patient history:  42 y.o. male with PMH of ESRD on HD MWF, antiphospholipid syndrome prescribed  warfarin, multiple infarcts (most recent left PCA 01/2023), seizure, HTN with recent admission for respiratory failure requiring intubation in the setting of seizure and aspiration pneumonitis who presented from HD with abnormal behavior including not wearing a shirt to HD center. In the ED, he became hypoxic and had witnessed seizure. Was given IV ativan and keppra load. EEG to evaluate for seizure Level of alertness: Awake AEDs during EEG study: LEV Technical aspects: This EEG study was done with scalp electrodes positioned according to the 10-20 International system of electrode placement. Electrical activity was reviewed with band pass filter of 1-70Hz , sensitivity of 7 uV/mm, display speed of 109mm/sec with a 60Hz  notched filter applied as appropriate. EEG data were recorded continuously and digitally stored.  Video monitoring was available and reviewed as appropriate. Description: No posterior dominant rhythm was seen. EEG showed continuous generalized polymorphic sharply contoured high amplitude 3 to 5 Hz theta-delta slowing. Hyperventilation and photic stimulation were not performed.   ABNORMALITY - Continuous slow, generalized IMPRESSION: This study is suggestive of moderate to severe diffuse encephalopathy. No seizures or epileptiform discharges were seen throughout the recording. Priyanka Annabelle Harman   CT HEAD WO CONTRAST ( )  Result Date: 03/27/2023 CLINICAL DATA:  Mental status change EXAM: CT HEAD WITHOUT CONTRAST TECHNIQUE:  Contiguous axial images were obtained from the base of the skull through the vertex without intravenous contrast. RADIATION DOSE REDUCTION: This exam was performed according to the departmental dose-optimization program which includes automated exposure control, adjustment of the mA and/or kV according to patient size and/or use of iterative reconstruction technique. COMPARISON:  CT brain 01/02/2023, 12/29/2022, 11/08/2022, MRI 08/10/2022 FINDINGS: Brain: Negative for hemorrhage,  mass effect or midline shift. Atrophy and advanced chronic small vessel ischemic changes of the white matter. Chronic left parietal infarct with encephalomalacia. Interval left posterior parieto-occipital infarct, series 2, image 24 which appears more subacute to chronic but is new from CT in July. Multiple small chronic infarcts in the left cerebellum, though with suspected interval left cerebellar hypo density on series 2, image 12. Vascular: No hyperdense vessels.  No unexpected calcification Skull: Normal. Negative for fracture or focal lesion. Sinuses/Orbits: Mild mucosal sinus changes Other: None IMPRESSION: 1. Negative for acute intracranial hemorrhage. 2. Atrophy and advanced chronic small vessel ischemic changes of the white matter. Chronic left parietal infarct. Interval left posterior parieto-occipital infarct which appears more subacute to chronic in nature but is new from CT in July. Multiple small chronic infarcts in the left cerebellum, though with suspected interval left cerebellar hypo density, raising concern for possible acute or subacute infarct. Consider further assessment with MRI. Electronically Signed   By: Jasmine Pang M.D.   On: 03/27/2023 15:35   DG Chest Portable 1 View  Result Date: 03/27/2023 CLINICAL DATA:  Hypoxia EXAM: PORTABLE CHEST 1 VIEW COMPARISON:  01/02/2023 FINDINGS: Right-sided central venous catheter tip at the right atrium. Cardiomegaly with vascular congestion and diffuse hazy pulmonary density consistent with edema. No pleural effusion or pneumothorax. Partially visualized stent in the left upper extremity. IMPRESSION: Cardiomegaly with vascular congestion and diffuse hazy pulmonary density suspicious for mild pulmonary edema. Electronically Signed   By: Jasmine Pang M.D.   On: 03/27/2023 15:25    Microbiology: Results for orders placed or performed during the hospital encounter of 01/18/23  Culture, blood (routine x 2)     Status: None   Collection Time:  01/18/23 10:26 PM   Specimen: BLOOD  Result Value Ref Range Status   Specimen Description BLOOD RIGHT ANTECUBITAL  Final   Special Requests   Final    BOTTLES DRAWN AEROBIC AND ANAEROBIC Blood Culture adequate volume   Culture   Final    NO GROWTH 5 DAYS Performed at [facility], [address], [place], [place] [address]    Report Status 01/23/2023 FINAL  Final  MRSA Next Gen by PCR, Nasal     Status: None   Collection Time: 01/19/23  1:07 AM   Specimen: Nasal Mucosa; Nasal Swab  Result Value Ref Range Status   MRSA by PCR Next Gen NOT DETECTED NOT DETECTED Final    Comment: (NOTE) The GeneXpert MRSA Assay (FDA approved for NASAL specimens only), is one component of a comprehensive MRSA colonization surveillance program. It is not intended to diagnose MRSA infection nor to guide or monitor treatment for MRSA infections. Test performance is not FDA approved in patients less than 70 years old. Performed at [facility], [address], [place], [place] [address]   Culture, blood (routine x 2)     Status: None   Collection Time: 01/19/23  2:13 AM   Specimen: BLOOD RIGHT HAND  Result Value Ref Range Status   Specimen Description BLOOD RIGHT HAND  Final   Special Requests  Final    BOTTLES DRAWN AEROBIC AND ANAEROBIC Blood Culture results may not be optimal due to an excessive volume of blood received in culture bottles   Culture   Final    NO GROWTH 5 DAYS Performed at [facility], [address], [place], [place] [address]    Report Status 01/24/2023 FINAL  Final    Labs: CBC: Recent Labs  Lab 04/15/23 0458 04/15/23 1335 04/17/23 0644 04/19/23 0529 04/20/23 0728  WBC  --  7.4 7.0 6.6 7.4  HGB 10.4* 10.2* 9.9* 10.1* 9.8*  HCT 32.4* 31.9* 30.4* 31.1* 30.5*  MCV  --  94.7 91.3 92.0 91.0  PLT  --  199 158 164 158   Basic Metabolic Panel: Recent Labs  Lab 04/15/23 0458 04/17/23 0643 04/17/23 0644 04/20/23 0728  NA 135 136 132* 134*  K 4.2  4.5 4.3 4.4  CL 96* 98 99 97*  CO2 23 21* 21* 20*  GLUCOSE 84 86 84 103*  BUN 81* 74* 73* 91*  CREATININE 14.57* 13.57* 13.61* 16.22*  CALCIUM 8.9 8.5* 8.2* 7.8*  MG  --   --  2.4  --   PHOS  --  7.2* 7.0* 7.8*   Liver Function Tests: Recent Labs  Lab 04/17/23 0643 04/20/23 0728  ALBUMIN 3.3* 3.3*   CBG: Recent Labs  Lab 04/13/23 1129  GLUCAP 104*    Discharge time spent: less than 30 minutes.  Signed: Rickey Barbara, MD [facility] 04/20/2023

## 2023-04-20 NOTE — Progress Notes (Signed)
Had been back from dialysis for several minutes and had been in the chair waiting to go over his discharge instructions when he, all of a sudden, started yelling out "help me".  Said he felt like his body was on fire and itching severely all over.  Said it always happens after CHG wipes used on his body but this time was the worst.  Had several tiny raised bumps over his trunk while he was shivering.  Wet cloth was used over his body and he said that help tremendously.  Dr Rhona Leavens was contacted and he benadryl was order.  He said it helped immediately.  He no longer has the raised tiny bumps and he said he feels better.  Waiting for his cab voucher to go home.

## 2023-04-20 NOTE — Progress Notes (Addendum)
Addendum 04/20/23 9:00am  Per MD, patient is wanting to leave AMA today. Discussed with Dr. Rhona Leavens: given that patient's current INR is 2.5 and will likely be <2.5 tomorrow, okay to order apixaban 5mg  BID and aspirin 81 mg daily to start tomorrow. Patient will be informed and counseled.  ~AU  PHARMACY - ANTICOAGULATION CONSULT NOTE  Pharmacy Consult for warfarin Indication:  APLA syndrome  Allergies  Allergen Reactions   Zyrtec [Cetirizine] Swelling    FACIAL SWELLING, NOT CATEGORIZED   Cetirizine & Related Swelling    Patient Measurements: Height: 6\' 1"  (185.4 cm) Weight: 110 kg (242 lb 8.1 oz) IBW/kg (Calculated) : 79.9 Heparin Dosing Weight: 103 kg   Vital Signs: Temp: 97.4 F (36.3 C) (11/02 0419) Temp Source: Oral (11/02 0419) BP: 138/99 (11/02 0419) Pulse Rate: 71 (11/02 0419)  Labs: Recent Labs    04/18/23 0714 04/19/23 0529 04/20/23 0532  HGB  --  10.1*  --   HCT  --  31.1*  --   PLT  --  164  --   LABPROT 29.0* 28.4* 27.4*  INR 2.7* 2.6* 2.5*   Estimated Creatinine Clearance: 9.1 mL/min (A) (by C-G formula based on SCr of 13.61 mg/dL (H)).  Medical History: Past Medical History:  Diagnosis Date   ESRD on hemodialysis (HCC)    Hypertension    Lupus    Seizures (HCC)    Seizures (HCC)    Stroke (HCC)    50 or 42 years of age.     Wears glasses    Assessment: 42 yo M presented with seizures with HD. Patient has history antiphospholipid syndrome, leading to hypercoagulable state and causing hx of strokes. Decision made to switch warfarin to apixaban with addition of bASA for APLS d/t lack of outpatient follow-up for warfarin/NR checks. Will plan to start Eliquis/bASA when INR is below goal. Copay for eliquis is $4. Patient will require education prior to discharge.   11/2 AM: INR 2.5 - therapeutic following held warfarin yesterday, anticipate apixaban and aspirin combination will be able to be restarted tomorrow.  Goal of Therapy:  Monitor platelets by  anticoagulation protocol: Yes   Plan:  Hold anticoagulation  Check INR daily for warfarin to apixaban switch  Continue to monitor H&H and platelets   Thank you for allowing pharmacy to be a part of this patient's care.  Verdene Rio, PharmD PGY1 Pharmacy Resident

## 2023-04-20 NOTE — Discharge Instructions (Addendum)
Information on my medicine - ELIQUIS (apixaban)  Why was Eliquis prescribed for you? Eliquis was prescribed for you to reduce the risk of a blood clot forming that can cause a stroke if you have a medical condition called antiphospholipid syndrome.   What do You need to know about Eliquis ? Take your Eliquis TWICE DAILY - one tablet in the morning and one tablet in the evening with or without food. If you have difficulty swallowing the tablet whole please discuss with your pharmacist how to take the medication safely.  Take Eliquis exactly as prescribed by your doctor and DO NOT stop taking Eliquis without talking to the doctor who prescribed the medication.  Stopping may increase your risk of developing a stroke.  Refill your prescription before you run out.  After discharge, you should have regular check-up appointments with your healthcare provider that is prescribing your Eliquis.  In the future your dose may need to be changed if your kidney function or weight changes by a significant amount or as you get older.  What do you do if you miss a dose? If you miss a dose, take it as soon as you remember on the same day and resume taking twice daily.  Do not take more than one dose of ELIQUIS at the same time to make up a missed dose.  Important Safety Information A possible side effect of Eliquis is bleeding. You should call your healthcare provider right away if you experience any of the following: Bleeding from an injury or your nose that does not stop. Unusual colored urine (red or dark brown) or unusual colored stools (red or black). Unusual bruising for unknown reasons. A serious fall or if you hit your head (even if there is no bleeding).  Some medicines may interact with Eliquis and might increase your risk of bleeding or clotting while on Eliquis. To help avoid this, consult your healthcare provider or pharmacist prior to using any new prescription or non-prescription  medications, including herbals, vitamins, non-steroidal anti-inflammatory drugs (NSAIDs) and supplements.  This website has more information on Eliquis (apixaban): http://www.eliquis.com/eliquis/home

## 2023-04-20 NOTE — Plan of Care (Signed)
  Problem: Education: Goal: Knowledge of disease or condition will improve Outcome: Progressing Goal: Knowledge of secondary prevention will improve (MUST DOCUMENT ALL) Outcome: Progressing   Problem: Coping: Goal: Will verbalize positive feelings about self Outcome: Progressing   Problem: Health Behavior/Discharge Planning: Goal: Ability to manage health-related needs will improve Outcome: Progressing   Problem: Self-Care: Goal: Ability to participate in self-care as condition permits will improve Outcome: Progressing   Problem: Education: Goal: Knowledge of General Education information will improve Description: Including pain rating scale, medication(s)/side effects and non-pharmacologic comfort measures Outcome: Progressing

## 2023-04-20 NOTE — Progress Notes (Signed)
[name] [name] Progress Note   Subjective: Seen prior to HD. No C/Os.     Objective Vitals:   04/20/23 0804 04/20/23 0832 04/20/23 0837 04/20/23 0847  BP: (!) 144/100  (!) 141/96   Pulse: 80  72 71  Resp: 17  11 17   Temp: (!) 97.5 F (36.4 C)  (!) 97.5 F (36.4 C)   TempSrc: Oral  Oral   SpO2: 98%  98% 99%  Weight:  111 kg    Height:       Physical Exam General: Chronically ill appearing male in NAD Heart: S1,S2 RRR No M/R/G Lungs:CTAB Abdomen: NABS, NT Extremities: No LE edema Dialysis Access: RIJ TDC Drsg intact.  Additional Objective Labs: Basic Metabolic Panel: Recent Labs  Lab 04/17/23 0643 04/17/23 0644 04/20/23 0728  NA 136 132* 134*  K 4.5 4.3 4.4  CL 98 99 97*  CO2 21* 21* 20*  GLUCOSE 86 84 103*  BUN 74* 73* 91*  CREATININE 13.57* 13.61* 16.22*  CALCIUM 8.5* 8.2* 7.8*  PHOS 7.2* 7.0* 7.8*   Liver Function Tests: Recent Labs  Lab 04/17/23 0643 04/20/23 0728  ALBUMIN 3.3* 3.3*   No results for input(s): "LIPASE", "AMYLASE" in the last 168 hours. CBC: Recent Labs  Lab 04/15/23 1335 04/17/23 0644 04/19/23 0529 04/20/23 0728  WBC 7.4 7.0 6.6 7.4  HGB 10.2* 9.9* 10.1* 9.8*  HCT 31.9* 30.4* 31.1* 30.5*  MCV 94.7 91.3 92.0 91.0  PLT 199 158 164 158   Blood Culture    Component Value Date/Time   SDES BLOOD RIGHT HAND 01/19/2023 0213   SPECREQUEST  01/19/2023 4098    BOTTLES DRAWN AEROBIC AND ANAEROBIC Blood Culture results may not be optimal due to an excessive volume of blood received in culture bottles   CULT  01/19/2023 0213    NO GROWTH 5 DAYS Performed at [facility], [address] [place], [place] [address]    REPTSTATUS 01/24/2023 FINAL 01/19/2023 7829    Cardiac Enzymes: No results for input(s): "CKTOTAL", "CKMB", "CKMBINDEX", "TROPONINI" in the last 168 hours. CBG: Recent Labs  Lab 04/13/23 1129  GLUCAP 104*   Iron Studies: No results for input(s): "IRON", "TIBC", "TRANSFERRIN", "FERRITIN" in the  last 72 hours. @lablastinr3 @ Studies/Results: No results found. Medications:  anticoagulant sodium citrate      atorvastatin  40 mg Oral Daily   Chlorhexidine Gluconate Cloth  6 each Topical Q0600   cinacalcet  60 mg Oral Q supper   darbepoetin (ARANESP) injection - DIALYSIS  60 mcg Subcutaneous Q Wed-1800   heparin sodium (porcine)  3,200 Units Intracatheter Once   levETIRAcetam  500 mg Oral BID   sevelamer carbonate  3,200 mg Oral TID WC     Dialysis Orders: [facility] MWF  4h   400/800   101.5kg   2/2 bath   RIJ TDC    Heparin none - last OP HD today 10/9, post wt 110.5kg (9kg over) - mircera 50 mcg IV q 2wks, last 10/2, due 10/16  - venofer 100mg  three times per week thru today - rocaltrol 2.0 mcg    Assessment/Plan: Seizures - acute on chronic, takes Keppra at home but misses doses. MRI with scattered infarcts - neurology implicates subtherapeutic INR.    AC Therapy - informed by Primary of having difficulty setting up outpatient coumadin visits for the patient. Per Dr. Marisue Humble, the Nephrology department will not be involved with patient's coumadin management, including lab draws. Recommend an alternative AC therapy.Marland KitchenMarland Kitchenperhaps Eliquis? Managed by  Primary.   H/o antiphospholipid syndrome - hx SLE, has been tried on multiple anticoagulants in the past but struggles w/ medication adherence.  On coumadin    ESRD - on HD MWF. Pre-HD standing weight 10/30 was 110.3kg. Chronic issue with noncompliance with HD as outpatient. Removed 3L yesterday. Push UF here as tolerated. Next HD 04/19/2023   HTN/volume - UF with HD as tolerated, continue fluid restriction.   Anemia of ESRD - Hb now 9.8, ESA given on 04/17/23.   CKD-MBD - CCa normalized, and phos is trending down. Continue binder and sensipar. Renal diet.   Dispo: Informed patient was denied SNF placement so the plan is for him to go home. Primary is working out his [facility] therapy in outpatient. Hopeful this can be sorted out so he can go  home today. Okay for dc from a renal standpoint.    [name] [name] H. [name] Couvillon NP-C 04/20/2023, 9:19 AM  [employer] [phone]

## 2023-04-20 NOTE — Progress Notes (Signed)
Received patient in bed to unit.  Alert and oriented.  Informed consent signed and in chart.   TX duration:3:15  Patient tolerated well.  Transported back to the room  Alert, without acute distress.  Hand-off given to patient's nurse.   Access used: right HD catheter Access issues: none  Total UF removed: 3L Medication(s) given: none   04/20/23 1213  Vitals  Temp 98.5 F (36.9 C)  Temp Source Oral  BP (!) 126/98  MAP (mmHg) 108  BP Location Right Wrist  BP Method Automatic  Patient Position (if appropriate) Lying  Pulse Rate 78  Pulse Rate Source Monitor  ECG Heart Rate 78  Resp 14  Oxygen Therapy  SpO2 92 %  O2 Device Room Air  During Treatment Monitoring  Blood Flow Rate (mL/min) 399 mL/min  Arterial Pressure (mmHg) -210.29 mmHg  Venous Pressure (mmHg) 245.24 mmHg  TMP (mmHg) 26.06 mmHg  Ultrafiltration Rate (mL/min) 1352 mL/min  Dialysate Flow Rate (mL/min) 299 ml/min  Dialysate Potassium Concentration 3  Dialysate Calcium Concentration 2.5  Duration of HD Treatment -hour(s) 3.25 hour(s)  Cumulative Fluid Removed (mL) per Treatment  3000.09  HD Safety Checks Performed Yes  Intra-Hemodialysis Comments Tx completed;Tolerated well  Dialysis Fluid Bolus Normal Saline  Bolus Amount (mL) 300 mL    Lourie Retz S [facility]

## 2023-04-20 NOTE — Progress Notes (Signed)
Transferred to dialysis.

## 2023-04-21 NOTE — Discharge Planning (Signed)
[facility] Patient Discharge Orders- [facility] CLINIC: [facility]  Patient's name: [name] [name] Admit/DC Dates: 03/27/2023 - 04/20/2023  NEW ALLERGY: CHLORHEXIDINE-ITCHING  Discharge Diagnoses: Seizures    Acute embolic CVA  Aranesp: Given: Yes   Date and amount of last dose: 04/17/2023 60 mcg  Last Hgb: 9.8 PRBC's Given: No Date/# of units: NA ESA dose for discharge: mircera 75 mcg IV q 2 weeks  IV Iron dose at discharge: Per protocol  Heparin change: No  EDW Change: Yes New EDW: 107.5 kg  Bath Change: NO  Access intervention/Change: No Details:  Hectorol/Calcitriol change: No  Discharge Labs: Calcium 7.8 Phosphorus 7.8 Albumin 3.3 K+ 4.4  IV Antibiotics: No Details:  On Coumadin?: No Changed to Apixaban 5 mg PO BID start 04/21/2023 Last INR: Next INR: Managed By:   OTHER/APPTS/LAB ORDERS:    D/C Meds to be reconciled by nurse after every discharge.  Completed By: Alonna Buckler [facility] [phone]   Reviewed by: MD:______ RN_______

## 2023-04-22 ENCOUNTER — Telehealth: Payer: Self-pay | Admitting: Physician Assistant

## 2023-04-22 ENCOUNTER — Telehealth: Payer: Self-pay

## 2023-04-22 NOTE — Progress Notes (Signed)
Late Note Entry- Nov. 4, 2024  Pt was d/c on Saturday. Contacted [facility] this morning to advise clinic of pt's d/c date and that pt should have resumed care today.   Olivia Canter Renal Navigator [phone]

## 2023-04-22 NOTE — Telephone Encounter (Signed)
Transition of care contact from inpatient facility  Date of Discharge: 04/20/23 Date of Contact: 04/22/23 Method of contact: Phone  Attempted to contact patient to discuss transition of care from inpatient admission. Patient did not answer the phone. Message was left on the patient's voicemail with call back instructions.  Rogers Blocker, PA-C 04/22/2023, 11:20 AM  [facility] Pager: [phone]

## 2023-04-22 NOTE — Transitions of Care (Post Inpatient/ED Visit) (Signed)
   04/22/2023  Name: [name] [name] MRN: [id number] DOB: March 29, 1981  Today's TOC FU Call Status: Today's TOC FU Call Status:: Unsuccessful Call (1st Attempt) Unsuccessful Call (1st Attempt) Date: 04/22/23  Attempted to reach the patient regarding the most recent Inpatient/ED visit.  Follow Up Plan: Additional outreach attempts will be made to reach the patient to complete the Transitions of Care (Post Inpatient/ED visit) call.   Signature  Robyne Peers, RN

## 2023-04-23 ENCOUNTER — Telehealth: Payer: Self-pay

## 2023-04-23 NOTE — Transitions of Care (Post Inpatient/ED Visit) (Signed)
   04/23/2023  Name: [name] [name] MRN: [id number] DOB: 07-29-1980  Today's TOC FU Call Status: Today's TOC FU Call Status:: Unsuccessful Call (2nd Attempt) Unsuccessful Call (1st Attempt) Date: 04/22/23 Unsuccessful Call (2nd Attempt) Date: 04/23/23  Attempted to reach the patient regarding the most recent Inpatient/ED visit.  Follow Up Plan: Additional outreach attempts will be made to reach the patient to complete the Transitions of Care (Post Inpatient/ED visit) call.   Signature  Robyne Peers, RN

## 2023-04-24 ENCOUNTER — Telehealth: Payer: Self-pay

## 2023-04-24 NOTE — Transitions of Care (Post Inpatient/ED Visit) (Signed)
   04/24/2023  Name: [name] [name] MRN: [id number] DOB: [birth year]  Today's TOC FU Call Status: Today's TOC FU Call Status:: Unsuccessful Call (3rd Attempt) Unsuccessful Call (1st Attempt) Date: 04/22/23 Unsuccessful Call (2nd Attempt) Date: 04/23/23 Unsuccessful Call (3rd Attempt) Date: 04/24/23  Attempted to reach the patient regarding the most recent Inpatient/ED visit.  Follow Up Plan: No further outreach attempts will be made at this time. We have been unable to contact the patient.  The patient has an appointment with Corene Cornea, PA at [facility] on 05/08/2023.   Signature  Robyne Peers, RN

## 2023-04-26 ENCOUNTER — Telehealth: Payer: Self-pay | Admitting: Nurse Practitioner

## 2023-04-26 NOTE — Telephone Encounter (Signed)
Home Health Verbal [phone] ers - Caller/Agency: [place] with [facility] Callback Number: [phone]  Service Requested: Skilled Nursing Frequency: 1 x a week for 9 weeks, 2 prn Any new concerns about the patient? Yes he just came out of the hospital for seizure activity. Education and med compliance   Please assist patient further

## 2023-04-28 ENCOUNTER — Encounter (HOSPITAL_COMMUNITY): Payer: Self-pay | Admitting: *Deleted

## 2023-04-28 ENCOUNTER — Other Ambulatory Visit: Payer: Self-pay

## 2023-04-28 ENCOUNTER — Emergency Department (HOSPITAL_COMMUNITY): Payer: 59

## 2023-04-28 ENCOUNTER — Encounter (HOSPITAL_COMMUNITY): Payer: Self-pay

## 2023-04-28 ENCOUNTER — Emergency Department (HOSPITAL_COMMUNITY)
Admission: EM | Admit: 2023-04-28 | Discharge: 2023-04-28 | Disposition: A | Discharge status: Home / Self Care | Payer: 59 | Attending: Emergency Medicine | Admitting: Emergency Medicine

## 2023-04-28 DIAGNOSIS — Z8616 Personal history of COVID-19: Secondary | ICD-10-CM | POA: Diagnosis not present

## 2023-04-28 DIAGNOSIS — N3090 Cystitis, unspecified without hematuria: Secondary | ICD-10-CM

## 2023-04-28 DIAGNOSIS — R300 Dysuria: Secondary | ICD-10-CM | POA: Insufficient documentation

## 2023-04-28 DIAGNOSIS — Z79899 Other long term (current) drug therapy: Secondary | ICD-10-CM | POA: Diagnosis not present

## 2023-04-28 DIAGNOSIS — N186 End stage renal disease: Secondary | ICD-10-CM | POA: Insufficient documentation

## 2023-04-28 DIAGNOSIS — R569 Unspecified convulsions: Secondary | ICD-10-CM

## 2023-04-28 DIAGNOSIS — Z7901 Long term (current) use of anticoagulants: Secondary | ICD-10-CM | POA: Diagnosis not present

## 2023-04-28 DIAGNOSIS — F17200 Nicotine dependence, unspecified, uncomplicated: Secondary | ICD-10-CM | POA: Insufficient documentation

## 2023-04-28 DIAGNOSIS — I120 Hypertensive chronic kidney disease with stage 5 chronic kidney disease or end stage renal disease: Secondary | ICD-10-CM | POA: Insufficient documentation

## 2023-04-28 DIAGNOSIS — Z7982 Long term (current) use of aspirin: Secondary | ICD-10-CM | POA: Diagnosis not present

## 2023-04-28 DIAGNOSIS — R319 Hematuria, unspecified: Secondary | ICD-10-CM | POA: Diagnosis present

## 2023-04-28 DIAGNOSIS — Z992 Dependence on renal dialysis: Secondary | ICD-10-CM | POA: Insufficient documentation

## 2023-04-28 LAB — BASIC METABOLIC PANEL
Anion gap: 16 — ABNORMAL HIGH (ref 5–15)
BUN: 48 mg/dL — ABNORMAL HIGH (ref 6–20)
CO2: 25 mmol/L (ref 22–32)
Calcium: 8.9 mg/dL (ref 8.9–10.3)
Chloride: 94 mmol/L — ABNORMAL LOW (ref 98–111)
Creatinine, Ser: 14.29 mg/dL — ABNORMAL HIGH (ref 0.61–1.24)
GFR, Estimated: 4 mL/min — ABNORMAL LOW (ref >60)
Glucose, Bld: 103 mg/dL — ABNORMAL HIGH (ref 70–99)
Potassium: 4.3 mmol/L (ref 3.5–5.1)
Sodium: 135 mmol/L (ref 135–145)

## 2023-04-28 LAB — CBC
HCT: 34.5 % — ABNORMAL LOW (ref 39.0–52.0)
Hemoglobin: 10.8 g/dL — ABNORMAL LOW (ref 13.0–17.0)
MCH: 30 pg (ref 26.0–34.0)
MCHC: 31.3 g/dL (ref 30.0–36.0)
MCV: 95.8 fL (ref 80.0–100.0)
Platelets: 137 10*3/uL — ABNORMAL LOW (ref 150–400)
RBC: 3.6 MIL/uL — ABNORMAL LOW (ref 4.22–5.81)
RDW: 14.7 % (ref 11.5–15.5)
WBC: 15 10*3/uL — ABNORMAL HIGH (ref 4.0–10.5)
nRBC: 0 % (ref 0.0–0.2)

## 2023-04-28 LAB — URINALYSIS, ROUTINE W REFLEX MICROSCOPIC
Bilirubin Urine: NEGATIVE
Glucose, UA: NEGATIVE mg/dL
Ketones, ur: NEGATIVE mg/dL
Nitrite: NEGATIVE
Protein, ur: 100 mg/dL — AB
Specific Gravity, Urine: 1.014 (ref 1.005–1.030)
WBC, UA: >50 WBC/hpf (ref 0–5)
pH: 8 (ref 5.0–8.0)

## 2023-04-28 MED ORDER — LEVETIRACETAM IN NACL 1000 MG/100ML IV SOLN
2000.0000 mg | Freq: Once | INTRAVENOUS | Status: AC
  Administered 2023-04-28: 2000 mg via INTRAVENOUS
  Filled 2023-04-28: qty 200

## 2023-04-28 MED ORDER — LEVETIRACETAM 500 MG PO TABS: 500.0000 mg | ORAL_TABLET | Freq: Two times a day (BID) | ORAL | 0 refills | Status: DC

## 2023-04-28 MED ORDER — KETOROLAC TROMETHAMINE 30 MG/ML IJ SOLN: 30.0000 mg | Freq: Once | INTRAMUSCULAR | Status: DC

## 2023-04-28 MED ORDER — CEPHALEXIN 250 MG PO CAPS: 250.0000 mg | ORAL_CAPSULE | Freq: Every day | ORAL | 0 refills | Status: AC

## 2023-04-28 MED ORDER — LEVETIRACETAM 500 MG PO TABS: 500.0000 mg | ORAL_TABLET | Freq: Every day | ORAL | 0 refills | Status: DC

## 2023-04-28 MED ORDER — LORAZEPAM 2 MG/ML IJ SOLN
INTRAMUSCULAR | Status: AC
  Administered 2023-04-28: 2 mg via INTRAVENOUS
  Filled 2023-04-28: qty 1

## 2023-04-28 MED ORDER — LORAZEPAM 2 MG/ML PO CONC: 2.0000 mg | Freq: Once | ORAL | Status: DC

## 2023-04-28 MED ORDER — IOHEXOL 350 MG/ML SOLN
75.0000 mL | Freq: Once | INTRAVENOUS | Status: AC | PRN
  Administered 2023-04-28: 75 mL via INTRAVENOUS

## 2023-04-28 MED ORDER — LORAZEPAM 2 MG/ML IJ SOLN: 2.0000 mg | Freq: Once | INTRAMUSCULAR | Status: AC

## 2023-04-28 NOTE — ED Provider Notes (Signed)
[place] EMERGENCY DEPARTMENT AT [facility] Provider Note  CSN: [id number] Arrival date & time: 04/28/23 1135  Chief Complaint(s) Hematuria  HPI [name] [name] is a 42 y.o. male here today for blood in his urine, and flank pain.  He says that a few days ago he had blood in his urine, and had a little bit of pain in his back at the time.  He says that the pain in his back is improved, but he still has pain with urination.  While the patient was telling me the story, he began to have a seizure.  He has a history of seizure disorder on Keppra.  Prior CVA on Eliquis.  He is a Monday Wednesday Friday dialysis patient.  He has been going to his dialysis.   Past Medical History Past Medical History:  Diagnosis Date   ESRD on hemodialysis (HCC)    Hypertension    Lupus    Seizures (HCC)    Seizures (HCC)    Stroke (HCC)    12 or 42 years of age.     Wears glasses    Patient Active Problem List   Diagnosis Date Noted   Cerebrovascular accident (CVA) due to embolism of cerebral artery (HCC) 04/04/2023   Seizure (HCC) 01/19/2023   Acute respiratory failure with hypoxia (HCC) 01/19/2023   Status epilepticus (HCC) 08/10/2022   ESRD on hemodialysis (HCC) 02/15/2022   Other disorders of phosphorus metabolism 08/18/2021   Fluid overload, unspecified 05/10/2021   Allergy, unspecified, initial encounter 04/19/2021   Anaphylactic shock, unspecified, sequela 04/19/2021   Pruritus, unspecified 04/19/2021   Hypocalcemia 04/01/2021   Long-term current use of anticonvulsant 08/04/2020   COVID-19 07/26/2020   Breakthrough seizure (HCC) 07/20/2020   Acute respiratory failure with hypoxia (HCC)    Lupus    Seizure, late effect of stroke (HCC) 08/26/2019   Secondary hyperparathyroidism of renal origin (HCC) 11/05/2018   Anemia in chronic kidney disease 09/23/2018   ESRD (end stage renal disease) (HCC) 03/18/2018   Prediabetes 01/10/2018   Chest pain in adult    CKD (chronic kidney  disease) stage 3, GFR 30-59 ml/min (HCC) 10/24/2015   Seizures (HCC) 10/24/2015   HTN (hypertension) 01/12/2015   Chronic anticoagulation 04/24/2011   Lupus anticoagulant positive 04/24/2011   OBESITY, NOS 08/15/2006   THROMBOCYTOPENIA 08/15/2006   TOBACCO DEPENDENCE 08/15/2006   CVA 08/15/2006   Home Medication(s) Prior to Admission medications   Medication Sig Start Date End Date Taking? Authorizing Provider  acetaminophen (TYLENOL) 325 MG tablet Take 2 tablets (650 mg total) by mouth every 6 (six) hours as needed for mild pain. 02/13/23   Lonia Blood, MD  apixaban (ELIQUIS) 5 MG TABS tablet Take 1 tablet (5 mg total) by mouth 2 (two) times daily. 04/21/23 05/21/23  Jerald Kief, MD  aspirin EC 81 MG tablet Take 1 tablet (81 mg total) by mouth daily. Swallow whole. 04/21/23 06/20/23  Jerald Kief, MD  atorvastatin (LIPITOR) 40 MG tablet Take 1 tablet (40 mg total) by mouth daily. 04/18/23   Jerald Kief, MD  cinacalcet (SENSIPAR) 30 MG tablet Take 2 tablets (60 mg total) by mouth daily with supper. 04/18/23 05/20/23  Jerald Kief, MD  docusate sodium (COLACE) 100 MG capsule Take 1 capsule (100 mg total) by mouth 2 (two) times daily as needed for mild constipation. 04/18/23   Jerald Kief, MD  levETIRAcetam (KEPPRA) 500 MG tablet Take 1 tablet (500 mg total) by mouth 2 (  two) times daily. 04/18/23 05/18/23  Jerald Kief, MD  multivitamin (RENA-VIT) TABS tablet Take 1 tablet by mouth at bedtime. 04/18/23   Jerald Kief, MD  sevelamer carbonate (RENVELA) 800 MG tablet Take 4 tablets (3,200 mg total) by mouth 3 (three) times daily with meals. 04/18/23 07/17/23  Jerald Kief, MD                                                                                                                                    Past Surgical History Past Surgical History:  Procedure Laterality Date   AV FISTULA PLACEMENT Left 12/30/2017   Procedure: ARTERIOVENOUS (AV) FISTULA CREATION VERSUS  INSERTION OF ARTERIOVENOUS GRAFT LEFT ARM;  Surgeon: Larina Earthly, MD;  Location: [facility] OR;  Service: Vascular;  Laterality: Left;   BASCILIC VEIN TRANSPOSITION Left 08/15/2018   Procedure: LEFT FIRST STAGE BASCILIC VEIN TRANSPOSITION;  Surgeon: Larina Earthly, MD;  Location: [facility] OR;  Service: Vascular;  Laterality: Left;   BASCILIC VEIN TRANSPOSITION Left 10/30/2018   Procedure: INSERTION OF GORE-TEX GRAFT LEFT UPPER ARM;  Surgeon: Maeola Harman, MD;  Location: [facility] OR;  Service: Vascular;  Laterality: Left;   INSERTION OF DIALYSIS CATHETER N/A 09/20/2018   Procedure: INSERTION OF TUNNELED DIALYSIS CATHETER RIGHT INTERNAL JUGULAR;  Surgeon: Cephus Shelling, MD;  Location: [facility] OR;  Service: Vascular;  Laterality: N/A;   IR FLUORO GUIDE CV LINE RIGHT  07/16/2022   IR US GUIDE VASC ACCESS RIGHT  07/16/2022   RENAL BIOPSY     Family History Family History  Problem Relation Age of Onset   Diabetes Mother    Diabetes Maternal Grandmother    Seizures Neg Hx        not on mother's side. he doesn't know about his father's side    Social History Social History   Tobacco Use   Smoking status: Unknown   Smokeless tobacco: Never  Vaping Use   Vaping status: Never Used  Substance Use Topics   Alcohol use: Not Currently   Drug use: No   Allergies Chlorhexidine, Zyrtec [cetirizine], and Cetirizine & related  Review of Systems Review of Systems  Physical Exam Vital Signs  I have reviewed the triage vital signs BP (!) 145/127 (BP Location: Right Arm)   Pulse (!) 102   Temp 98.8 F (37.1 C) (Oral)   Resp 17   Ht 6\' 1"  (1.854 m)   Wt 107.3 kg   SpO2 100%   BMI 31.20 kg/m   Physical Exam Vitals reviewed.  Constitutional:      Appearance: Normal appearance.  Cardiovascular:     Rate and Rhythm: Normal rate.  Pulmonary:     Effort: Pulmonary effort is normal.  Abdominal:     General: Abdomen is flat.     Palpations: Abdomen is soft.     Tenderness: There is no right CVA  tenderness or left CVA tenderness.  Musculoskeletal:        General: Normal range of motion.  Neurological:     General: No focal deficit present.     Mental Status: He is alert and oriented to person, place, and time.     ED Results and Treatments Labs (all labs ordered are listed, but only abnormal results are displayed) Labs Reviewed  BASIC METABOLIC PANEL - Abnormal; Notable for the following components:      Result Value   Chloride 94 (*)    Glucose, Bld 103 (*)    BUN 48 (*)    Creatinine, Ser 14.29 (*)    GFR, Estimated 4 (*)    Anion gap 16 (*)    All other components within normal limits  CBC - Abnormal; Notable for the following components:   WBC 15.0 (*)    RBC 3.60 (*)    Hemoglobin 10.8 (*)    HCT 34.5 (*)    Platelets 137 (*)    All other components within normal limits  URINALYSIS, ROUTINE W REFLEX MICROSCOPIC                                                                                                                          Radiology No results found.  Pertinent labs & imaging results that were available during my care of the patient were reviewed by me and considered in my medical decision making (see MDM for details).  Medications Ordered in ED Medications  levETIRAcetam (KEPPRA) IVPB 1000 mg/100 mL premix (2,000 mg Intravenous New Bag/Given 04/28/23 1228)  LORazepam (ATIVAN) injection 2 mg (2 mg Intravenous Given 04/28/23 1211)                                                                                                                                     Procedures Procedures  (including critical care time)  Medical Decision Making / ED Course   This patient presents to the ED for concern of flank pain, dysuria followed by seizure, this involves an extensive number of treatment options, and is a complaint that carries with it a high risk of complications and morbidity.  The differential diagnosis includes cystitis, pyelonephritis,  nephrolithiasis, seizure disorder, less likely CVA.  MDM: Regarding the patient's initial reason for coming to the emergency department, have obtained urinalysis, basic labs on the patient.  He has been compliant with his dialysis.  When  I first met the patient, he was awake, alert, appropriately conversant.  His physical exam was overall unremarkable.  While I was speaking with the patient, he did indeed have a seizure.  He has a known seizure disorder.  I was unable to ask him if he has been compliant with his Keppra.  Move the patient to a room from her hallway, and provided him with some Ativan and Keppra.  Prior to receiving the Ativan, patient was postictal, attempting to get out of bed.  Will reassess over the coming hours to see how he is responding.  I considered imaging the patient's head, however his appropriate postictal response following a witnessed seizure, and someone with a known seizure disorder is overall reassuring.  If patient does not have a return to baseline, would consider CT imaging.  Will image patient's abdomen and pelvis to assess for stone, pyelonephritis.  Reassessment 4:15 PM-patient has returned to baseline, is still little bit sleepy.  He is able to tell me that he did not take his seizure medicines.  Do not see indication for imaging the patient's head.  Will finally get around to collecting the patient's urine after a bit of a delay while we managed the patient seizure disorder.  Reassessment 2:50 PM-finally of the patient's urine.  Equivocal for UTI, however with his symptoms I think is reasonable to treat.  Will also recent patient with prescription for his Keppra.   Additional history obtained: -Additional history obtained from  -External records from outside source obtained and reviewed including: Chart review including previous notes, labs, imaging, consultation notes   Lab Tests: -I ordered, reviewed, and interpreted labs.   The pertinent results  include:   Labs Reviewed  BASIC METABOLIC PANEL - Abnormal; Notable for the following components:      Result Value   Chloride 94 (*)    Glucose, Bld 103 (*)    BUN 48 (*)    Creatinine, Ser 14.29 (*)    GFR, Estimated 4 (*)    Anion gap 16 (*)    All other components within normal limits  CBC - Abnormal; Notable for the following components:   WBC 15.0 (*)    RBC 3.60 (*)    Hemoglobin 10.8 (*)    HCT 34.5 (*)    Platelets 137 (*)    All other components within normal limits  URINALYSIS, ROUTINE W REFLEX MICROSCOPIC      EKG my independent review the patient's EKG shows no ST segment depressions or elevations, no T wave versions, no evidence of acute ischemia.  EKG Interpretation Date/Time:    Ventricular Rate:    PR Interval:    QRS Duration:    QT Interval:    QTC Calculation:   R Axis:      Text Interpretation:           Imaging Studies ordered: I ordered imaging studies including CT of the abdomen and pelvis I independently visualized and interpreted imaging. I agree with the radiologist interpretation   Medicines ordered and prescription drug management: Meds ordered this encounter  Medications   LORazepam (ATIVAN) 2 MG/ML injection    Black, Crystal L: cabinet override   DISCONTD: LORazepam (ATIVAN) 2 MG/ML concentrated solution 2 mg   levETIRAcetam (KEPPRA) IVPB 1000 mg/100 mL premix   LORazepam (ATIVAN) injection 2 mg   DISCONTD: ketorolac (TORADOL) 30 MG/ML injection 30 mg    -I have reviewed the patients home medicines and have made adjustments as needed  Cardiac Monitoring: The patient was maintained on a cardiac monitor.  I personally viewed and interpreted the cardiac monitored which showed an underlying rhythm of: Normal sinus rhythm  Social Determinants of Health:  Factors impacting patients care include: Lack of access to primary care, medication noncompliance   Reevaluation: After the interventions noted above, I reevaluated the  patient and found that they have :improved  Co morbidities that complicate the patient evaluation  Past Medical History:  Diagnosis Date   ESRD on hemodialysis (HCC)    Hypertension    Lupus    Seizures (HCC)    Seizures (HCC)    Stroke (HCC)    73 or 42 years of age.     Wears glasses       Dispostion: I considered admission for this patient, however patient returned to baseline, and can be managed as outpatient medications.     Final Clinical Impression(s) / ED Diagnoses Final diagnoses:  None     @PCDICTATION @    Anders Simmonds T, DO 04/28/23 1859

## 2023-04-28 NOTE — ED Notes (Signed)
Ambulatory to bathroom. Gait steady.

## 2023-04-28 NOTE — Discharge Instructions (Addendum)
You can take your Keppra once daily for the next month.  Please follow-up with her primary care doctor for repeat refills.  You can take Keflex for her urinary tract infection once per day for the next 1 week.  Please follow-up with your primary care doctor this week to discuss managing your seizure disorder.  Return to the emergency department if you develop fever, chills, inability to eat or drink, or confusion.

## 2023-04-28 NOTE — Telephone Encounter (Signed)
Verbal order can be given.  

## 2023-04-28 NOTE — ED Triage Notes (Signed)
Pt states hematuria and pain with urination on Friday, accompanied by lower back pain.  Pt had dialysis Friday. VS stable.

## 2023-04-29 NOTE — Telephone Encounter (Signed)
Spoke with Marylene Land at [facility] given for Skilled Nursing Frequency: 1 x a week for 9 weeks, 2 prn For Education and med compliance

## 2023-05-08 ENCOUNTER — Inpatient Hospital Stay: Payer: 59 | Admitting: Physician Assistant

## 2023-05-08 NOTE — Progress Notes (Deleted)
Patient ID: CYE EINSTEIN, male   DOB: 16-Sep-1980, 42 y.o.   MRN: [id number]  After ED visit 04/28/2023 [name] [name] is a 42 y.o. male here today for blood in his urine, and flank pain.  He says that a few days ago he had blood in his urine, and had a little bit of pain in his back at the time.  He says that the pain in his back is improved, but he still has pain with urination.  While the patient was telling me the story, he began to have a seizure.  He has a history of seizure disorder on Keppra.  Prior CVA on Eliquis.  He is a Monday Wednesday Friday dialysis patient.  He has been going to his dialysis.    This patient presents to the ED for concern of flank pain, dysuria followed by seizure, this involves an extensive number of treatment options, and is a complaint that carries with it a high risk of complications and morbidity.  The differential diagnosis includes cystitis, pyelonephritis, nephrolithiasis, seizure disorder, less likely CVA.   MDM: Regarding the patient's initial reason for coming to the emergency department, have obtained urinalysis, basic labs on the patient.  He has been compliant with his dialysis.  When I first met the patient, he was awake, alert, appropriately conversant.  His physical exam was overall unremarkable.   While I was speaking with the patient, he did indeed have a seizure.  He has a known seizure disorder.  I was unable to ask him if he has been compliant with his Keppra.  Move the patient to a room from her hallway, and provided him with some Ativan and Keppra.  Prior to receiving the Ativan, patient was postictal, attempting to get out of bed.  Will reassess over the coming hours to see how he is responding.  I considered imaging the patient's head, however his appropriate postictal response following a witnessed seizure, and someone with a known seizure disorder is overall reassuring.  If patient does not have a return to baseline, would consider CT imaging.    Will image patient's abdomen and pelvis to assess for stone, pyelonephritis.   Reassessment 4:15 PM-patient has returned to baseline, is still little bit sleepy.  He is able to tell me that he did not take his seizure medicines.  Do not see indication for imaging the patient's head.  Will finally get around to collecting the patient's urine after a bit of a delay while we managed the patient seizure disorder.   Reassessment 2:50 PM-finally of the patient's urine.  Equivocal for UTI, however with his symptoms I think is reasonable to treat.  Will also recent patient with prescription for his Keppra.     Additional history obtained: -Additional history obtained from  -External records from outside source obtained and reviewed including: Chart review including previous notes, labs, imaging, consultation notes     Lab Tests: -I ordered, reviewed, and interpreted labs.   The pertinent results include:        Labs Reviewed  BASIC METABOLIC PANEL - Abnormal; Notable for the following components:      Result Value     Chloride 94 (*)      Glucose, Bld 103 (*)      BUN 48 (*)      Creatinine, Ser 14.29 (*)      GFR, Estimated 4 (*)      Anion gap 16 (*)      All other  components within normal limits  CBC - Abnormal; Notable for the following components:    WBC 15.0 (*)      RBC 3.60 (*)      Hemoglobin 10.8 (*)      HCT 34.5 (*)      Platelets 137 (*)      All other components within normal limits  URINALYSIS, ROUTINE W REFLEX MICROSCOPIC        EKG my independent review the patient's EKG shows no ST segment depressions or elevations, no T wave versions, no evidence of acute ischemia.

## 2023-05-14 ENCOUNTER — Emergency Department (HOSPITAL_COMMUNITY)
Admission: EM | Admit: 2023-05-14 | Discharge: 2023-05-14 | Discharge status: Against Medical Advice | Payer: 59 | Attending: Emergency Medicine | Admitting: Emergency Medicine

## 2023-05-14 ENCOUNTER — Other Ambulatory Visit: Payer: Self-pay

## 2023-05-14 ENCOUNTER — Encounter (HOSPITAL_COMMUNITY): Payer: Self-pay

## 2023-05-14 DIAGNOSIS — R569 Unspecified convulsions: Secondary | ICD-10-CM | POA: Diagnosis present

## 2023-05-14 DIAGNOSIS — Z5321 Procedure and treatment not carried out due to patient leaving prior to being seen by health care provider: Secondary | ICD-10-CM | POA: Insufficient documentation

## 2023-05-14 LAB — CBC WITH DIFFERENTIAL/PLATELET
Abs Immature Granulocytes: 0.04 10*3/uL (ref 0.00–0.07)
Basophils Absolute: 0.1 10*3/uL (ref 0.0–0.1)
Basophils Relative: 1 %
Eosinophils Absolute: 0.2 10*3/uL (ref 0.0–0.5)
Eosinophils Relative: 2 %
HCT: 36.3 % — ABNORMAL LOW (ref 39.0–52.0)
Hemoglobin: 11.1 g/dL — ABNORMAL LOW (ref 13.0–17.0)
Immature Granulocytes: 1 %
Lymphocytes Relative: 9 %
Lymphs Abs: 0.7 10*3/uL (ref 0.7–4.0)
MCH: 29.7 pg (ref 26.0–34.0)
MCHC: 30.6 g/dL (ref 30.0–36.0)
MCV: 97.1 fL (ref 80.0–100.0)
Monocytes Absolute: 0.8 10*3/uL (ref 0.1–1.0)
Monocytes Relative: 11 %
Neutro Abs: 5.7 10*3/uL (ref 1.7–7.7)
Neutrophils Relative %: 76 %
Platelets: 162 10*3/uL (ref 150–400)
RBC: 3.74 MIL/uL — ABNORMAL LOW (ref 4.22–5.81)
RDW: 14.5 % (ref 11.5–15.5)
WBC: 7.4 10*3/uL (ref 4.0–10.5)
nRBC: 0 % (ref 0.0–0.2)

## 2023-05-14 LAB — BASIC METABOLIC PANEL
Anion gap: 10 (ref 5–15)
BUN: 15 mg/dL (ref 6–20)
CO2: 31 mmol/L (ref 22–32)
Calcium: 8.5 mg/dL — ABNORMAL LOW (ref 8.9–10.3)
Chloride: 97 mmol/L — ABNORMAL LOW (ref 98–111)
Creatinine, Ser: 7.63 mg/dL — ABNORMAL HIGH (ref 0.61–1.24)
GFR, Estimated: 8 mL/min — ABNORMAL LOW (ref >60)
Glucose, Bld: 105 mg/dL — ABNORMAL HIGH (ref 70–99)
Potassium: 3.9 mmol/L (ref 3.5–5.1)
Sodium: 138 mmol/L (ref 135–145)

## 2023-05-14 LAB — CBG MONITORING, ED: Glucose-Capillary: 98 mg/dL (ref 70–99)

## 2023-05-14 NOTE — ED Provider Triage Note (Signed)
Emergency Medicine Provider Triage Evaluation Note  [name] [name] , a 42 y.o. male  was evaluated in triage.  Pt complains of being at church, then having a period where he does not remember what happened.  Does not feel like it was a seizure because he "did not bite his tongue".  Denies hitting his head.  Denies any chest pain shortness of breath.  Feels at his baseline currently  Review of Systems  Positive: As above  Negative: As above  Physical Exam  BP 128/79 (BP Location: Left Arm)   Pulse 99   Temp 98.9 F (37.2 C) (Oral)   Resp 17   Ht 6' (1.829 m)   Wt 104.3 kg   SpO2 97%   BMI 31.19 kg/m  Gen:   Awake, no distress   Resp:  Normal effort  MSK:   Moves extremities without difficulty    Medical Decision Making  Medically screening exam initiated at 6:14 PM.  Appropriate orders placed.  [name] [name] was informed that the remainder of the evaluation will be completed by another provider, this initial triage assessment does not replace that evaluation, and the importance of remaining in the ED until their evaluation is complete.     Arabella Merles, PA-C 05/14/23 1848

## 2023-05-14 NOTE — ED Notes (Signed)
Pt left. 

## 2023-05-15 LAB — LEVETIRACETAM LEVEL: Levetiracetam Lvl: 9.8 ug/mL — ABNORMAL LOW (ref 10.0–40.0)

## 2023-06-10 ENCOUNTER — Other Ambulatory Visit (HOSPITAL_COMMUNITY): Payer: Self-pay

## 2023-06-11 ENCOUNTER — Other Ambulatory Visit: Payer: Self-pay | Admitting: Nurse Practitioner

## 2023-06-11 NOTE — Telephone Encounter (Signed)
Requested medication (s) are due for refill today: -  Requested medication (s) are on the active medication list: no not all of them  Last refill:  midodrine: 02/13/23 #270  Levetiracetam: 04/28/23 #30  Midodrine:  Discontinued Stop Taking at Discharge Lonia Blood, MD 02/13/23 1843  Multivitamin: 04/18/23 expired  Eliquis: expired 05/21/23  Future visit scheduled: no  Notes to clinic:  all were sent for reording Most were ordered at discharge   Requested Prescriptions  Pending Prescriptions Disp Refills   levETIRAcetam (KEPPRA) 500 MG tablet [Pharmacy Med Name: LEVETIRACETAM 500 MG TABS 500 Tablet] 60 tablet 10    Sig: TAKE ONE (1) TABLET BY MOUTH TWICE DAILY *NEW PRESCRIPTION REQUEST*     Neurology:  Anticonvulsants - levetiracetam Failed - 06/11/2023  4:16 PM      Failed - Cr in normal range and within 360 days    Creat  Date Value Ref Range Status  01/24/2021 15.72 (H) 0.60 - 1.29 mg/dL Final    Comment:    Verified by repeat analysis. .    Creatinine, Ser  Date Value Ref Range Status  05/14/2023 7.63 (H) 0.61 - 1.24 mg/dL Final   Creatinine, POC  Date Value Ref Range Status  12/10/2016 200 mg/dL Final   Creatinine, Urine  Date Value Ref Range Status  12/21/2017 86.75 mg/dL Final    Comment:    Performed at [facility], [address], [place], [place] [address]  12/21/2017 87.57 mg/dL Final         Passed - Completed PHQ-2 or PHQ-9 in the last 360 days      Passed - Valid encounter within last 12 months    Recent Outpatient Visits           9 months ago Encounter for annual physical exam   [place] [facility] - [facility] Dept Of [place]. [facility] Claiborne Rigg, NP   11 months ago Seizure, late effect of stroke [facility])   [place] [facility] - A Dept Of [place]. [facility] Claiborne Rigg, NP   1 year ago Chronic anticoagulation   [facility] at [facility], Marzella Schlein, [place]   2  years ago Primary hypertension   [place] [facility] [place] - A Dept Of [place]. [facility] Claiborne Rigg, NP   3 years ago Essential hypertension   [place] [facility] [place] - A Dept Of [place]. [facility] [place], [place] W, NP               carvedilol (COREG) 6.25 MG tablet [Pharmacy Med Name: CARVEDILOL 6.25 MG TABS 6.25 Tablet] 60 tablet 10    Sig: TAKE ONE (1) TABLET BY MOUTH TWICE DAILY *NEW PRESCRIPTION REQUEST*     Cardiovascular: Beta Blockers 3 Failed - 06/11/2023  4:16 PM      Failed - Cr in normal range and within 360 days    Creat  Date Value Ref Range Status  01/24/2021 15.72 (H) 0.60 - 1.29 mg/dL Final    Comment:    Verified by repeat analysis. .    Creatinine, Ser  Date Value Ref Range Status  05/14/2023 7.63 (H) 0.61 - 1.24 mg/dL Final   Creatinine, POC  Date Value Ref Range Status  12/10/2016 200 mg/dL Final   Creatinine, Urine  Date Value Ref Range Status  12/21/2017 86.75 mg/dL Final    Comment:  Performed at [facility], [address] [place], [place] [address]  12/21/2017 87.57 mg/dL Final         Failed - Valid encounter within last 6 months    Recent Outpatient Visits           9 months ago Encounter for annual physical exam   [place] [facility] - A Dept Of [place]. [facility] Claiborne Rigg, NP   11 months ago Seizure, late effect of stroke [facility])   [place] [facility] - A Dept Of [place]. [facility] Claiborne Rigg, NP   1 year ago Chronic anticoagulation   [facility] Health Primary Care at [facility], Marzella Schlein, [place]   2 years ago Primary hypertension   [place] [facility] [place] - A Dept Of [place]. [facility] Claiborne Rigg, NP   3 years ago Essential hypertension   [place] [facility] [place] - A Dept Of [place]. [facility] [place], [place] W, NP              Passed - AST in normal range and  within 360 days    AST  Date Value Ref Range Status  04/11/2023 21 15 - 41 U/L Final         Passed - ALT in normal range and within 360 days    ALT  Date Value Ref Range Status  04/11/2023 20 0 - 44 U/L Final         Passed - Last BP in normal range    BP Readings from Last 1 Encounters:  05/14/23 128/79         Passed - Last Heart Rate in normal range    Pulse Readings from Last 1 Encounters:  05/14/23 99          midodrine (PROAMATINE) 5 MG tablet [Pharmacy Med Name: MIDODRINE 5MG  TABS 5 Tablet] 90 tablet 10    Sig: TAKE 1 TABLET BY MOUTH THREE TIMES DAILY WITH MEALS *NEW PRESCRIPTION REQUEST*     Not Delegated - Cardiovascular: Midodrine Failed - 06/11/2023  4:16 PM      Failed - This refill cannot be delegated      Failed - Cr in normal range and within 360 days    Creat  Date Value Ref Range Status  01/24/2021 15.72 (H) 0.60 - 1.29 mg/dL Final    Comment:    Verified by repeat analysis. .    Creatinine, Ser  Date Value Ref Range Status  05/14/2023 7.63 (H) 0.61 - 1.24 mg/dL Final   Creatinine, POC  Date Value Ref Range Status  12/10/2016 200 mg/dL Final   Creatinine, Urine  Date Value Ref Range Status  12/21/2017 86.75 mg/dL Final    Comment:    Performed at [facility], [address], [place], [place] [address]  12/21/2017 87.57 mg/dL Final         Passed - ALT in normal range and within 360 days    ALT  Date Value Ref Range Status  04/11/2023 20 0 - 44 U/L Final         Passed - AST in normal range and within 360 days    AST  Date Value Ref Range Status  04/11/2023 21 15 - 41 U/L Final         Passed - Last BP in normal range    BP Readings from Last  1 Encounters:  05/14/23 128/79         Passed - Valid encounter within last 12 months    Recent Outpatient Visits           9 months ago Encounter for annual physical exam   [place] [facility] [place] - A Dept Of [place]. [name] Claiborne Rigg, NP   11  months ago Seizure, late effect of stroke [facility])   [place] [facility] - A Dept Of [place]. [facility] Claiborne Rigg, NP   1 year ago Chronic anticoagulation   [facility] Health Primary Care at [facility], Marzella Schlein, [place]   2 years ago Primary hypertension   [place] [facility] [place] - A Dept Of [place]. [facility] Claiborne Rigg, NP   3 years ago Essential hypertension   [place] [facility] [place] - A Dept Of [place]. [facility] [place], [place], NP               Multiple Vitamin (MULTIVITAMIN) TABS [Pharmacy Med Name: MULTIVITAMIN TABS Tablet] 30 tablet 10    Sig: TAKE 1 TABLET BY MOUTH EVERY DAY *NEW PRESCRIPTION REQUEST*     There is no refill protocol information for this order     ELIQUIS 5 MG TABS tablet [Pharmacy Med Name: ELIQUIS 5 MG TABLET 5 Tablet] 60 tablet 10    Sig: TAKE ONE (1) TABLET BY MOUTH TWICE DAILY *NEW PRESCRIPTION REQUEST*     Hematology:  Anticoagulants - apixaban Failed - 06/11/2023  4:16 PM      Failed - HGB in normal range and within 360 days    Hemoglobin  Date Value Ref Range Status  05/14/2023 11.1 (L) 13.0 - 17.0 g/dL Final  82/95/6213 08.6 13.0 - 17.7 g/dL Final   HGB  Date Value Ref Range Status  10/14/2013 15.5 13.0 - 17.1 g/dL Final         Failed - HCT in normal range and within 360 days    HCT  Date Value Ref Range Status  05/14/2023 36.3 (L) 39.0 - 52.0 % Final  10/14/2013 45.6 38.4 - 49.9 % Final   Hematocrit  Date Value Ref Range Status  09/27/2020 38.5 37.5 - 51.0 % Final         Failed - Cr in normal range and within 360 days    Creat  Date Value Ref Range Status  01/24/2021 15.72 (H) 0.60 - 1.29 mg/dL Final    Comment:    Verified by repeat analysis. .    Creatinine, Ser  Date Value Ref Range Status  05/14/2023 7.63 (H) 0.61 - 1.24 mg/dL Final   Creatinine, POC  Date Value Ref Range Status  12/10/2016 200 mg/dL Final   Creatinine, Urine   Date Value Ref Range Status  12/21/2017 86.75 mg/dL Final    Comment:    Performed at [facility], [address], [place], [place] [address]  12/21/2017 87.57 mg/dL Final         Passed - PLT in normal range and within 360 days    Platelets  Date Value Ref Range Status  05/14/2023 162 150 - 400 K/uL Final  09/27/2020 69 (LL) 150 - 450 x10E3/uL Final    Comment:    Actual platelet count may be somewhat higher than reported due to aggregation of platelets in this sample.  Passed - AST in normal range and within 360 days    AST  Date Value Ref Range Status  04/11/2023 21 15 - 41 U/L Final         Passed - ALT in normal range and within 360 days    ALT  Date Value Ref Range Status  04/11/2023 20 0 - 44 U/L Final         Passed - Valid encounter within last 12 months    Recent Outpatient Visits           9 months ago Encounter for annual physical exam   [place] [facility] [place] - A Dept Of [place]. [facility] Claiborne Rigg, NP   11 months ago Seizure, late effect of stroke [facility])   [place] [facility] - A Dept Of [place]. [facility] Claiborne Rigg, NP   1 year ago Chronic anticoagulation   [facility] at [facility], Marzella Schlein, [place]   2 years ago Primary hypertension   [place] [facility] [place] - A Dept Of [place]. [facility] Claiborne Rigg, NP   3 years ago Essential hypertension   [place] [facility] [place] - A Dept Of [place]. [facility] Claiborne Rigg, [place]

## 2023-06-13 ENCOUNTER — Other Ambulatory Visit: Payer: Self-pay | Admitting: Nurse Practitioner

## 2023-06-13 NOTE — Telephone Encounter (Signed)
Medication Refill -  Most Recent Primary Care Visit:  Provider: Anthoney Harada  Department: [facility]  Visit Type: MEDICARE AWV, INITIAL  Date: 03/26/2023  Medication: levETIRAcetam (KEPPRA) 500 MG tablet [[id number]]  ENDED   Rx #: [id number]  carvedilol (COREG) 6.25 MG tablet [[id number]]  DISCONTINUED   midodrine (PROAMATINE) tablet 5 mg   [[id number]]   Rx #: [id number]  apixaban (ELIQUIS) 5 MG TABS tablet [[id number]]  ENDED   Rx #: [id number]  multivitamin (RENA-VIT) TABS tablet [[id number]]   Has the patient contacted their pharmacy? Yes (Agent: If no, request that the patient contact the pharmacy for the refill. If patient does not wish to contact the pharmacy document the reason why and proceed with request.) (Agent: If yes, when and what did the pharmacy advise?)  [facility], [place] - [address] Phone: [phone]  Fax: [phone]      Is this the correct pharmacy for this prescription? Yes If no, delete pharmacy and type the correct one.  This is the patient's preferred pharmacy:   Has the prescription been filled recently? Yes  Is the patient out of the medication? Yes  Has the patient been seen for an appointment in the last year OR does the patient have an upcoming appointment? Yes  Can we respond through MyChart? Yes  Agent: Please be advised that Rx refills may take up to 3 business days. We ask that you follow-up with your pharmacy.

## 2023-06-17 ENCOUNTER — Other Ambulatory Visit: Payer: Self-pay | Admitting: Nurse Practitioner

## 2023-06-18 NOTE — Telephone Encounter (Signed)
 Called [facility] and prescriptions were sent for the Eliquis  and Keppra .  Was advised that the Multivitamin wont be pain by Medicare. Was advised that if pt calls [facility] they will give her a discount card to lower the copay to a few dollars. Last refill 04/18/23 by Dr Cindy. Midodrone was refused previously  d/t pt needing an appt. Called pt on both numbers listed in chart. Neither numbers were working numbers despite recalls to check if NT dialed correct number. Sent pt MyCHart message to calll back to make appointment for Midodrine  refill.   Requested Prescriptions  Pending Prescriptions Disp Refills   apixaban  (ELIQUIS ) 5 MG TABS tablet 60 tablet 0    Sig: Take 1 tablet (5 mg total) by mouth 2 (two) times daily.     Hematology:  Anticoagulants - apixaban  Failed - 06/18/2023  3:26 PM      Failed - HGB in normal range and within 360 days    Hemoglobin  Date Value Ref Range Status  05/14/2023 11.1 (L) 13.0 - 17.0 g/dL Final  95/87/7977 86.9 13.0 - 17.7 g/dL Final   HGB  Date Value Ref Range Status  10/14/2013 15.5 13.0 - 17.1 g/dL Final         Failed - HCT in normal range and within 360 days    HCT  Date Value Ref Range Status  05/14/2023 36.3 (L) 39.0 - 52.0 % Final  10/14/2013 45.6 38.4 - 49.9 % Final   Hematocrit  Date Value Ref Range Status  09/27/2020 38.5 37.5 - 51.0 % Final         Failed - Cr in normal range and within 360 days    Creat  Date Value Ref Range Status  01/24/2021 15.72 (H) 0.60 - 1.29 mg/dL Final    Comment:    Verified by repeat analysis. .    Creatinine, Ser  Date Value Ref Range Status  05/14/2023 7.63 (H) 0.61 - 1.24 mg/dL Final   Creatinine, POC  Date Value Ref Range Status  12/10/2016 200 mg/dL Final   Creatinine, Urine  Date Value Ref Range Status  12/21/2017 86.75 mg/dL Final    Comment:    Performed at [facility], [address], [place], [place] [address]  12/21/2017 87.57 mg/dL Final         Passed - PLT in normal  range and within 360 days    Platelets  Date Value Ref Range Status  05/14/2023 162 150 - 400 K/uL Final  09/27/2020 69 (LL) 150 - 450 x10E3/uL Final    Comment:    Actual platelet count may be somewhat higher than reported due to aggregation of platelets in this sample.          Passed - AST in normal range and within 360 days    AST  Date Value Ref Range Status  04/11/2023 21 15 - 41 U/L Final         Passed - ALT in normal range and within 360 days    ALT  Date Value Ref Range Status  04/11/2023 20 0 - 44 U/L Final         Passed - Valid encounter within last 12 months    Recent Outpatient Visits           10 months ago Encounter for annual physical exam   [place] [facility] [place] - A Dept Of [place]. [facility] Theotis Haze ORN, NP   11 months ago Seizure,  late effect of stroke [facility])   [place] [facility] - A Dept Of [place]. [facility] Theotis Haze ORN, NP   1 year ago Chronic anticoagulation   [facility] at [facility], Jon HERO, [place]   2 years ago Primary hypertension   [place] [facility] [place] - A Dept Of [place]. [facility] Theotis Haze ORN, NP   3 years ago Essential hypertension   [place] [facility] [place] - A Dept Of [place]. [facility] [place], Zelda W, NP               levETIRAcetam  (KEPPRA ) 500 MG tablet 30 tablet 0    Sig: Take 1 tablet (500 mg total) by mouth daily.     Neurology:  Anticonvulsants - levetiracetam  Failed - 06/18/2023  3:26 PM      Failed - Cr in normal range and within 360 days    Creat  Date Value Ref Range Status  01/24/2021 15.72 (H) 0.60 - 1.29 mg/dL Final    Comment:    Verified by repeat analysis. .    Creatinine, Ser  Date Value Ref Range Status  05/14/2023 7.63 (H) 0.61 - 1.24 mg/dL Final   Creatinine, POC  Date Value Ref Range Status  12/10/2016 200 mg/dL Final   Creatinine, Urine  Date Value Ref Range  Status  12/21/2017 86.75 mg/dL Final    Comment:    Performed at [facility], [address] [place], [place] [address]  12/21/2017 87.57 mg/dL Final         Passed - Completed PHQ-2 or PHQ-9 in the last 360 days      Passed - Valid encounter within last 12 months    Recent Outpatient Visits           10 months ago Encounter for annual physical exam   [place] [facility] - A Dept Of [place]. [facility] Theotis Haze ORN, NP   11 months ago Seizure, late effect of stroke [name])   [place] [facility] [place] - A Dept Of [place]. [facility] Theotis Haze ORN, NP   1 year ago Chronic anticoagulation   [facility] Care at [facility], Jon HERO, [place]   2 years ago Primary hypertension   [place] [facility] [place] - A Dept Of [place]. [facility] Theotis Haze ORN, NP   3 years ago Essential hypertension   [place] [facility] [place] - A Dept Of [place]. [facility] [place], Zelda W, NP               multivitamin (RENA-VIT) TABS tablet 100 tablet 0    Sig: Take 1 tablet by mouth at bedtime.     Off-Protocol Failed - 06/18/2023  3:26 PM      Failed - Medication not assigned to a protocol, review manually.      Passed - Valid encounter within last 12 months    Recent Outpatient Visits           10 months ago Encounter for annual physical exam   [place] [facility] [place] - A Dept Of [place]. [facility] Theotis Haze ORN, NP   11 months ago Seizure, late effect of stroke [facility])    Comm Health [place] - A Dept Of [place]. [facility] Theotis Haze ORN, [place]  1 year ago Chronic anticoagulation   [place] Primary Care at [facility], Jon HERO, PA-C   2 years ago Primary hypertension   [place] [facility] [place] - A Dept Of [place]. [facility] Theotis Haze ORN, NP   3 years ago Essential hypertension   [place] [facility]  [facility] [place] - A Dept Of [place]. [facility] Theotis Haze ORN, NP             Endocrinology:  Vitamins Passed - 06/18/2023  3:26 PM      Passed - Valid encounter within last 12 months    Recent Outpatient Visits           10 months ago Encounter for annual physical exam   [place] [facility] - A Dept Of [place]. [facility] Theotis Haze ORN, NP   11 months ago Seizure, late effect of stroke [facility])   [place] [facility] - A Dept Of [place]. [facility] Theotis Haze ORN, NP   1 year ago Chronic anticoagulation   [facility] Health Primary Care at [facility], Jon HERO, [place]   2 years ago Primary hypertension   [place] [facility] [place] - A Dept Of [place]. [facility] Theotis Haze ORN, NP   3 years ago Essential hypertension    Comm [facility] [place] - A Dept Of [place]. [facility] Theotis Haze ORN, [place]

## 2023-06-18 NOTE — Telephone Encounter (Signed)
Need to call pharmacy to confirm if medications have been sent to patient at [facility] , [facility] now requesting to refill Rxs?

## 2023-07-09 ENCOUNTER — Other Ambulatory Visit: Payer: Self-pay | Admitting: Nurse Practitioner

## 2023-07-26 ENCOUNTER — Other Ambulatory Visit: Payer: Self-pay | Admitting: Nurse Practitioner

## 2023-07-29 NOTE — Telephone Encounter (Signed)
 Requested medication (s) are due for refill today: Yes  Requested medication (s) are on the active medication list: Yes  Last refill:  06/16/23  Future visit scheduled: No  Notes to clinic:  Unable to refill per protocol, appointment needed.      Requested Prescriptions  Pending Prescriptions Disp Refills   levETIRAcetam  (KEPPRA ) 500 MG tablet [Pharmacy Med Name: LEVETIRACETAM  500 MG TABS 500 Tablet] 60 tablet 10    Sig: TAKE 1 TABLET BY MOUTH TWICE DAILY     Neurology:  Anticonvulsants - levetiracetam  Failed - 07/29/2023  9:31 AM      Failed - Cr in normal range and within 360 days    Creat  Date Value Ref Range Status  01/24/2021 15.72 (H) 0.60 - 1.29 mg/dL Final    Comment:    Verified by repeat analysis. .    Creatinine, Ser  Date Value Ref Range Status  05/14/2023 7.63 (H) 0.61 - 1.24 mg/dL Final   Creatinine, POC  Date Value Ref Range Status  12/10/2016 200 mg/dL Final   Creatinine, Urine  Date Value Ref Range Status  12/21/2017 86.75 mg/dL Final    Comment:    Performed at [facility], [address] [place], [place] [address]  12/21/2017 87.57 mg/dL Final         Passed - Completed PHQ-2 or PHQ-9 in the last 360 days      Passed - Valid encounter within last 12 months    Recent Outpatient Visits           11 months ago Encounter for annual physical exam   [facility] [place]. [facility] Collins Dean, NP   1 year ago Seizure, late effect of stroke [facility])   [place] [facility] - [facility] Of [place]. [facility] Collins Dean, NP   1 year ago Chronic anticoagulation   [facility], [place], [place]   2 years ago Primary hypertension   [facility] [facility] [place] - [facility] Dept Of [place]. [facility] Collins Dean, NP   4 years ago Essential hypertension    [facility] - [facility] Dept Of [place]. [facility], Zelda W, NP               carvedilol  (COREG ) 6.25 MG tablet [Pharmacy Med Name: CARVEDILOL  6.25 MG TABS 6.25 Tablet] 60 tablet 10    Sig: TAKE 1 TABLET BY MOUTH TWICE DAILY     Cardiovascular: Beta Blockers 3 Failed - 07/29/2023  9:31 AM      Failed - Cr in normal range and within 360 days    Creat  Date Value Ref Range Status  01/24/2021 15.72 (H) 0.60 - 1.29 mg/dL Final    Comment:    Verified by repeat analysis. .    Creatinine, Ser  Date Value Ref Range Status  05/14/2023 7.63 (H) 0.61 - 1.24 mg/dL Final   Creatinine, POC  Date Value Ref Range Status  12/10/2016 200 mg/dL Final   Creatinine, Urine  Date Value Ref Range Status  12/21/2017 86.75 mg/dL Final    Comment:    Performed at [facility], [address], [place], [place] [address]  12/21/2017 87.57 mg/dL Final         Failed - Valid encounter within last 6 months    Recent Outpatient  Visits           11 months ago Encounter for annual physical exam   [place] [facility] [place] Dept Of [place]. [facility] Dean, NP   1 year ago Seizure, late effect of stroke [facility])   [place] [facility] - [facility] Dept Of [place]. [facility] Collins Dean, NP   1 year ago Chronic anticoagulation   [facility], [place], [place]   2 years ago Primary hypertension   [place] [facility] [place] - [facility] Of [place]. [facility] Dean, NP   4 years ago Essential hypertension   [place] [facility] [place] - [facility] Dept Of [place]. [facility], [place] W, NP              Passed - AST in normal range and within 360 days    AST  Date Value Ref Range Status  04/11/2023 21 15 - 41 U/L Final         Passed - ALT in normal range and within 360 days    ALT  Date Value Ref Range Status  04/11/2023 20 0 - 44 U/L Final         Passed - Last BP in normal range    BP  Readings from Last 1 Encounters:  05/14/23 128/79         Passed - Last Heart Rate in normal range    Pulse Readings from Last 1 Encounters:  05/14/23 99          ELIQUIS  5 MG TABS tablet [Pharmacy Med Name: ELIQUIS  5 MG TABLET 5 Tablet] 60 tablet 10    Sig: TAKE 1 TABLET BY MOUTH TWICE DAILY *PATIENT NEEDS APPOINTMENT*     Hematology:  Anticoagulants - apixaban  Failed - 07/29/2023  9:31 AM      Failed - HGB in normal range and within 360 days    Hemoglobin  Date Value Ref Range Status  05/14/2023 11.1 (L) 13.0 - 17.0 g/dL Final  16/03/9603 54.0 13.0 - 17.7 g/dL Final   HGB  Date Value Ref Range Status  10/14/2013 15.5 13.0 - 17.1 g/dL Final         Failed - HCT in normal range and within 360 days    HCT  Date Value Ref Range Status  05/14/2023 36.3 (L) 39.0 - 52.0 % Final  10/14/2013 45.6 38.4 - 49.9 % Final   Hematocrit  Date Value Ref Range Status  09/27/2020 38.5 37.5 - 51.0 % Final         Failed - Cr in normal range and within 360 days    Creat  Date Value Ref Range Status  01/24/2021 15.72 (H) 0.60 - 1.29 mg/dL Final    Comment:    Verified by repeat analysis. .    Creatinine, Ser  Date Value Ref Range Status  05/14/2023 7.63 (H) 0.61 - 1.24 mg/dL Final   Creatinine, POC  Date Value Ref Range Status  12/10/2016 200 mg/dL Final   Creatinine, Urine  Date Value Ref Range Status  12/21/2017 86.75 mg/dL Final    Comment:    Performed at [facility], [address], [place], [place] [address]  12/21/2017 87.57 mg/dL Final         Passed - PLT in normal range and within 360 days    Platelets  Date Value Ref Range Status  05/14/2023 162 150 - 400 K/uL Final  09/27/2020 69 (LL) 150 - 450 x10E3/uL Final    Comment:    Actual platelet count may be somewhat higher than reported due to aggregation of platelets in this sample.          Passed - AST in normal range and within 360 days    AST  Date Value Ref Range Status  04/11/2023 21 15 - 41  U/L Final         Passed - ALT in normal range and within 360 days    ALT  Date Value Ref Range Status  04/11/2023 20 0 - 44 U/L Final         Passed - Valid encounter within last 12 months    Recent Outpatient Visits           11 months ago Encounter for annual physical exam   [place] [facility] [place] Of [place]. [facility] Collins Dean, NP   1 year ago Seizure, late effect of stroke [facility])   [place] [facility] - [facility] Dept Of [place]. [facility] Collins Dean, NP   1 year ago Chronic anticoagulation   [facility] [place], [place]   2 years ago Primary hypertension   [place] [facility] [place] - [facility] Dept Of [place]. [facility] Collins Dean, NP   4 years ago Essential hypertension   [place] [facility] [place] - A Dept Of [place]. [facility] Collins Dean, [place]

## 2023-07-29 NOTE — Telephone Encounter (Signed)
 Attempted to contact patient to let him know an office visit is needed to receive medication refills. No answer on both lines listed in patient chart. Mobile line is not in service and the home phone just rang no option to leave voicemail. Will send MyChart message as well.

## 2023-08-01 ENCOUNTER — Emergency Department (HOSPITAL_COMMUNITY): Payer: 59

## 2023-08-01 ENCOUNTER — Encounter (HOSPITAL_COMMUNITY): Payer: Self-pay | Admitting: Emergency Medicine

## 2023-08-01 ENCOUNTER — Emergency Department (HOSPITAL_COMMUNITY)
Admission: EM | Admit: 2023-08-01 | Discharge: 2023-08-01 | Disposition: A | Discharge status: Home / Self Care | Payer: 59 | Source: Home / Self Care | Attending: Emergency Medicine | Admitting: Emergency Medicine

## 2023-08-01 ENCOUNTER — Encounter (HOSPITAL_COMMUNITY): Payer: Self-pay | Admitting: *Deleted

## 2023-08-01 ENCOUNTER — Encounter (HOSPITAL_COMMUNITY): Payer: Self-pay

## 2023-08-01 ENCOUNTER — Other Ambulatory Visit: Payer: Self-pay

## 2023-08-01 ENCOUNTER — Observation Stay (HOSPITAL_COMMUNITY)
Admission: EM | Admit: 2023-08-01 | Discharge: 2023-08-03 | Disposition: A | Discharge status: Home / Self Care | Payer: 59 | Attending: Emergency Medicine | Admitting: Emergency Medicine

## 2023-08-01 DIAGNOSIS — Z6836 Body mass index (BMI) 36.0-36.9, adult: Secondary | ICD-10-CM | POA: Diagnosis not present

## 2023-08-01 DIAGNOSIS — D696 Thrombocytopenia, unspecified: Secondary | ICD-10-CM | POA: Diagnosis present

## 2023-08-01 DIAGNOSIS — Z79899 Other long term (current) drug therapy: Secondary | ICD-10-CM | POA: Diagnosis not present

## 2023-08-01 DIAGNOSIS — N186 End stage renal disease: Secondary | ICD-10-CM | POA: Insufficient documentation

## 2023-08-01 DIAGNOSIS — J9601 Acute respiratory failure with hypoxia: Secondary | ICD-10-CM | POA: Diagnosis not present

## 2023-08-01 DIAGNOSIS — Z992 Dependence on renal dialysis: Secondary | ICD-10-CM | POA: Insufficient documentation

## 2023-08-01 DIAGNOSIS — R569 Unspecified convulsions: Secondary | ICD-10-CM | POA: Diagnosis not present

## 2023-08-01 DIAGNOSIS — R760 Raised antibody titer: Secondary | ICD-10-CM | POA: Diagnosis present

## 2023-08-01 DIAGNOSIS — I120 Hypertensive chronic kidney disease with stage 5 chronic kidney disease or end stage renal disease: Secondary | ICD-10-CM | POA: Insufficient documentation

## 2023-08-01 DIAGNOSIS — Z7901 Long term (current) use of anticoagulants: Secondary | ICD-10-CM | POA: Insufficient documentation

## 2023-08-01 DIAGNOSIS — Z8673 Personal history of transient ischemic attack (TIA), and cerebral infarction without residual deficits: Secondary | ICD-10-CM

## 2023-08-01 DIAGNOSIS — I10 Essential (primary) hypertension: Secondary | ICD-10-CM | POA: Diagnosis present

## 2023-08-01 DIAGNOSIS — D6862 Lupus anticoagulant syndrome: Secondary | ICD-10-CM | POA: Insufficient documentation

## 2023-08-01 DIAGNOSIS — E66812 Obesity, class 2: Secondary | ICD-10-CM | POA: Insufficient documentation

## 2023-08-01 DIAGNOSIS — R0902 Hypoxemia: Secondary | ICD-10-CM

## 2023-08-01 LAB — CBC WITH DIFFERENTIAL/PLATELET
Abs Immature Granulocytes: 0.03 10*3/uL (ref 0.00–0.07)
Basophils Absolute: 0.1 10*3/uL (ref 0.0–0.1)
Basophils Relative: 1 %
Eosinophils Absolute: 0.2 10*3/uL (ref 0.0–0.5)
Eosinophils Relative: 3 %
HCT: 39.3 % (ref 39.0–52.0)
Hemoglobin: 12.5 g/dL — ABNORMAL LOW (ref 13.0–17.0)
Immature Granulocytes: 0 %
Lymphocytes Relative: 12 %
Lymphs Abs: 1 10*3/uL (ref 0.7–4.0)
MCH: 31 pg (ref 26.0–34.0)
MCHC: 31.8 g/dL (ref 30.0–36.0)
MCV: 97.5 fL (ref 80.0–100.0)
Monocytes Absolute: 0.9 10*3/uL (ref 0.1–1.0)
Monocytes Relative: 11 %
Neutro Abs: 5.6 10*3/uL (ref 1.7–7.7)
Neutrophils Relative %: 73 %
Platelets: 20 10*3/uL — CL (ref 150–400)
RBC: 4.03 MIL/uL — ABNORMAL LOW (ref 4.22–5.81)
RDW: 16.2 % — ABNORMAL HIGH (ref 11.5–15.5)
WBC: 7.8 10*3/uL (ref 4.0–10.5)
nRBC: 0 % (ref 0.0–0.2)

## 2023-08-01 LAB — BASIC METABOLIC PANEL
Anion gap: 12 (ref 5–15)
BUN: 25 mg/dL — ABNORMAL HIGH (ref 6–20)
CO2: 30 mmol/L (ref 22–32)
Calcium: 9.2 mg/dL (ref 8.9–10.3)
Chloride: 96 mmol/L — ABNORMAL LOW (ref 98–111)
Creatinine, Ser: 8.63 mg/dL — ABNORMAL HIGH (ref 0.61–1.24)
GFR, Estimated: 7 mL/min — ABNORMAL LOW (ref >60)
Glucose, Bld: 98 mg/dL (ref 70–99)
Potassium: 3.5 mmol/L (ref 3.5–5.1)
Sodium: 138 mmol/L (ref 135–145)

## 2023-08-01 LAB — PLATELET COUNT: Platelets: 23 10*3/uL — CL (ref 150–400)

## 2023-08-01 LAB — CBC
HCT: 43.2 % (ref 39.0–52.0)
Hemoglobin: 13.3 g/dL (ref 13.0–17.0)
MCH: 30.8 pg (ref 26.0–34.0)
MCHC: 30.8 g/dL (ref 30.0–36.0)
MCV: 100 fL (ref 80.0–100.0)
Platelets: 25 10*3/uL — CL (ref 150–400)
RBC: 4.32 MIL/uL (ref 4.22–5.81)
RDW: 16.2 % — ABNORMAL HIGH (ref 11.5–15.5)
WBC: 7.9 10*3/uL (ref 4.0–10.5)
nRBC: 0.4 % — ABNORMAL HIGH (ref 0.0–0.2)

## 2023-08-01 LAB — CBG MONITORING, ED
Glucose-Capillary: 107 mg/dL — ABNORMAL HIGH (ref 70–99)
Glucose-Capillary: 86 mg/dL (ref 70–99)

## 2023-08-01 MED ORDER — SODIUM CHLORIDE 0.9% FLUSH
3.0000 mL | Freq: Two times a day (BID) | INTRAVENOUS | Status: DC
  Administered 2023-08-01 – 2023-08-02 (×3): 3 mL via INTRAVENOUS

## 2023-08-01 MED ORDER — DEXAMETHASONE 4 MG PO TABS
40.0000 mg | ORAL_TABLET | Freq: Once | ORAL | Status: AC
  Administered 2023-08-01: 40 mg via ORAL
  Filled 2023-08-01: qty 10

## 2023-08-01 MED ORDER — LEVETIRACETAM IN NACL 1000 MG/100ML IV SOLN
1000.0000 mg | Freq: Once | INTRAVENOUS | Status: AC
  Administered 2023-08-01: 1000 mg via INTRAVENOUS
  Filled 2023-08-01: qty 100

## 2023-08-01 MED ORDER — ACETAMINOPHEN 325 MG PO TABS
650.0000 mg | ORAL_TABLET | Freq: Four times a day (QID) | ORAL | Status: DC | PRN
  Filled 2023-08-01: qty 2

## 2023-08-01 MED ORDER — LEVETIRACETAM 500 MG PO TABS
500.0000 mg | ORAL_TABLET | Freq: Two times a day (BID) | ORAL | Status: DC
  Administered 2023-08-01 – 2023-08-03 (×4): 500 mg via ORAL
  Filled 2023-08-01 (×4): qty 1

## 2023-08-01 MED ORDER — OXYCODONE HCL 5 MG PO TABS: 5.0000 mg | ORAL_TABLET | Freq: Four times a day (QID) | ORAL | Status: DC | PRN

## 2023-08-01 MED ORDER — ACETAMINOPHEN 650 MG RE SUPP: 650.0000 mg | Freq: Four times a day (QID) | RECTAL | Status: DC | PRN

## 2023-08-01 MED ORDER — CARVEDILOL 6.25 MG PO TABS
6.2500 mg | ORAL_TABLET | Freq: Two times a day (BID) | ORAL | Status: DC
  Administered 2023-08-02 (×2): 6.25 mg via ORAL
  Filled 2023-08-01 (×2): qty 2

## 2023-08-01 MED ORDER — DEXAMETHASONE 4 MG PO TABS
40.0000 mg | ORAL_TABLET | ORAL | Status: DC
  Administered 2023-08-02: 40 mg via ORAL
  Filled 2023-08-01: qty 10

## 2023-08-01 MED ORDER — LEVETIRACETAM IN NACL 500 MG/100ML IV SOLN: 500.0000 mg | Freq: Two times a day (BID) | INTRAVENOUS | Status: DC

## 2023-08-01 MED ORDER — ATORVASTATIN CALCIUM 40 MG PO TABS
40.0000 mg | ORAL_TABLET | Freq: Every day | ORAL | Status: DC
  Administered 2023-08-02 – 2023-08-03 (×2): 40 mg via ORAL
  Filled 2023-08-01 (×2): qty 1

## 2023-08-01 MED ORDER — HYDROMORPHONE HCL 1 MG/ML IJ SOLN: 0.5000 mg | INTRAMUSCULAR | Status: DC | PRN

## 2023-08-01 MED ORDER — DEXAMETHASONE 4 MG PO TABS: 40.0000 mg | ORAL_TABLET | Freq: Two times a day (BID) | ORAL | 0 refills | Status: DC

## 2023-08-01 MED ORDER — SENNA 8.6 MG PO TABS: 1.0000 | ORAL_TABLET | Freq: Every day | ORAL | Status: DC | PRN

## 2023-08-01 NOTE — ED Triage Notes (Signed)
Patient had been discharge. Patient found outside in the parking lot on ground.  LOC.  Slow to answer, slight confusion noted.  History of seizure was seen earlier today for seizures  CBG 86

## 2023-08-01 NOTE — ED Provider Notes (Signed)
[place] EMERGENCY DEPARTMENT AT [facility] Provider Note   CSN: [id number] Arrival date & time: 08/01/23  1056     History  Chief Complaint  Patient presents with   Seizures    [name] [name] is a 43 y.o. male.  43 yo M with a chief complaints of a seizure.  Patient has a history of seizures.  He was at dialysis today because he had had too much fluid on him yesterday.  Gets dialysis Monday Wednesday and Friday.  He got 3 hours of dialysis done and was leaving but felt lightheaded when he got up to walk around.  They had him sit down and then when he woke up they told him he had a seizure and he was being taken here by EMS.  He denies any injury.  Feels like otherwise he has been normal.  He denies any missed doses of his medication.  He tells me initially that he takes Lamictal but after I saw him he was discharged on Keppra at his last visit he said oh nevermind I take Keppra.   Seizures      Home Medications Prior to Admission medications   Medication Sig Start Date End Date Taking? Authorizing Provider  dexamethasone (DECADRON) 4 MG tablet Take 10 tablets (40 mg total) by mouth 2 (two) times daily with a meal for 4 days. 08/01/23 08/05/23 Yes Melene Plan, DO  acetaminophen (TYLENOL) 325 MG tablet Take 2 tablets (650 mg total) by mouth every 6 (six) hours as needed for mild pain. 02/13/23   Lonia Blood, MD  apixaban (ELIQUIS) 5 MG TABS tablet TAKE ONE (1) TABLET BY MOUTH TWICE DAILY *NEW PRESCRIPTION REQUEST* 06/16/23   Claiborne Rigg, NP  atorvastatin (LIPITOR) 40 MG tablet Take 1 tablet (40 mg total) by mouth daily. 04/18/23   Jerald Kief, MD  carvedilol (COREG) 6.25 MG tablet TAKE ONE (1) TABLET BY MOUTH TWICE DAILY *NEW PRESCRIPTION REQUEST* 06/16/23   Claiborne Rigg, NP  docusate sodium (COLACE) 100 MG capsule Take 1 capsule (100 mg total) by mouth 2 (two) times daily as needed for mild constipation. 04/18/23   Jerald Kief, MD  levETIRAcetam  (KEPPRA) 500 MG tablet TAKE ONE (1) TABLET BY MOUTH TWICE DAILY *NEW PRESCRIPTION REQUEST* 06/16/23   Claiborne Rigg, NP  Multiple Vitamin (MULTIVITAMIN) TABS TAKE 1 TABLET BY MOUTH EVERY DAY *NEW PRESCRIPTION REQUEST* 06/16/23   Claiborne Rigg, NP  multivitamin (RENA-VIT) TABS tablet Take 1 tablet by mouth at bedtime. 04/18/23   Jerald Kief, MD      Allergies    Chlorhexidine, Zyrtec [cetirizine], and Cetirizine & related    Review of Systems   Review of Systems  Neurological:  Positive for seizures.    Physical Exam Updated Vital Signs BP (!) 125/90 (BP Location: Right Arm)   Pulse 77   Temp 98.4 F (36.9 C) (Oral)   Resp 18   Wt 104.3 kg   SpO2 100%   BMI 31.19 kg/m  Physical Exam Vitals and nursing note reviewed.  Constitutional:      Appearance: He is well-developed.  HENT:     Head: Normocephalic and atraumatic.  Eyes:     Pupils: Pupils are equal, round, and reactive to light.  Neck:     Vascular: No JVD.  Cardiovascular:     Rate and Rhythm: Normal rate and regular rhythm.     Heart sounds: No murmur heard.    No friction  rub. No gallop.  Pulmonary:     Effort: No respiratory distress.     Breath sounds: No wheezing.  Abdominal:     General: There is no distension.     Tenderness: There is no abdominal tenderness. There is no guarding or rebound.  Musculoskeletal:        General: Normal range of motion.     Cervical back: Normal range of motion and neck supple.  Skin:    Coloration: Skin is not pale.     Findings: No rash.  Neurological:     Mental Status: He is alert and oriented to person, place, and time.  Psychiatric:        Behavior: Behavior normal.     ED Results / Procedures / Treatments   Labs (all labs ordered are listed, but only abnormal results are displayed) Labs Reviewed  BASIC METABOLIC PANEL - Abnormal; Notable for the following components:      Result Value   Chloride 96 (*)    BUN 25 (*)    Creatinine, Ser 8.63 (*)     GFR, Estimated 7 (*)    All other components within normal limits  CBC WITH DIFFERENTIAL/PLATELET - Abnormal; Notable for the following components:   RBC 4.03 (*)    Hemoglobin 12.5 (*)    RDW 16.2 (*)    Platelets 20 (*)    All other components within normal limits  CBG MONITORING, ED - Abnormal; Notable for the following components:   Glucose-Capillary 107 (*)    All other components within normal limits  LAMOTRIGINE LEVEL  LEVETIRACETAM LEVEL  PLATELET COUNT    EKG None  Radiology No results found.  Procedures Procedures    Medications Ordered in ED Medications  dexamethasone (DECADRON) tablet 40 mg (has no administration in time range)    ED Course/ Medical Decision Making/ A&P                                 Medical Decision Making Amount and/or Complexity of Data Reviewed Labs: ordered.  Risk Prescription drug management.   42 yo M reportedly had a seizure.  He is currently at his baseline.  States compliance with his medications.  On dialysis.  Awaiting electrolytes.  No significant electrolyte abnormalities.  Potassium is 3.5.  Patient's platelet count was 20.  Ordered to recheck however the patient is very difficult stick.  I discussed case with Dr. Arlana Pouch, hematology.  He recommended high-dose Decadron and they will see him back in the office for recheck.  3:02 PM:  I have discussed the diagnosis/risks/treatment options with the patient.  Evaluation and diagnostic testing in the emergency department does not suggest an emergent condition requiring admission or immediate intervention beyond what has been performed at this time.  They will follow up with PCP. We also discussed returning to the ED immediately if new or worsening sx occur. We discussed the sx which are most concerning (e.g., sudden worsening pain, fever, inability to tolerate by mouth) that necessitate immediate return. Medications administered to the patient during their visit and any new  prescriptions provided to the patient are listed below.  Medications given during this visit Medications  dexamethasone (DECADRON) tablet 40 mg (has no administration in time range)     The patient appears reasonably screen and/or stabilized for discharge and I doubt any other medical condition or other [facility] requiring further screening, evaluation, or treatment in the  ED at this time prior to discharge.          Final Clinical Impression(s) / ED Diagnoses Final diagnoses:  Thrombocytopenia (HCC)    Rx / DC Orders ED Discharge Orders          Ordered    dexamethasone (DECADRON) 4 MG tablet  2 times daily with meals        08/01/23 1456              Melene Plan, DO 08/01/23 1503

## 2023-08-01 NOTE — ED Notes (Signed)
IV team at bedside and attempt start IV, RN IV states patient did not allow her to do IV. This RN went to bedside to talk with patient. Patient refused, upset stating the lady pulled him arm without informing him what she was doing. States does not want IV done, does not see the need

## 2023-08-01 NOTE — ED Provider Notes (Signed)
[place] EMERGENCY DEPARTMENT AT [facility] Provider Note   CSN: [id number] Arrival date & time: 08/01/23  1620     History  Chief Complaint  Patient presents with   Loss of Consciousness    [name] [name] is a 43 y.o. male.  Patient is a 43 year old male who presents with seizure.  He has a history of hypertension, lupus, seizures, end-stage renal disease on dialysis Monday Wednesday Friday although he was at dialysis today having some extra fluid taken off.  He presented to the ED earlier this morning after he had a seizure at dialysis.  He has a known history of seizures.  During the ED visit this morning, he was found to have thrombocytopenia.  Reportedly he has a history of some chronically low platelets although today his platelet count was 20.  This was discussed with oncology hematology and patient was loaded with Decadron and was going to follow-up in the hematology clinic.  He was discharged from the ED and then was found outside in the parking lot.  He reports he had another seizure.  He was initially confused but on my evaluation is awake and alert.  He denies any injuries from the seizure.  Of note, he is on Eliquis.       Home Medications Prior to Admission medications   Medication Sig Start Date End Date Taking? Authorizing Provider  apixaban (ELIQUIS) 5 MG TABS tablet TAKE ONE (1) TABLET BY MOUTH TWICE DAILY *NEW PRESCRIPTION REQUEST* 06/16/23  Yes Claiborne Rigg, NP  acetaminophen (TYLENOL) 325 MG tablet Take 2 tablets (650 mg total) by mouth every 6 (six) hours as needed for mild pain. 02/13/23   Lonia Blood, MD  atorvastatin (LIPITOR) 40 MG tablet Take 1 tablet (40 mg total) by mouth daily. 04/18/23   Jerald Kief, MD  carvedilol (COREG) 6.25 MG tablet TAKE ONE (1) TABLET BY MOUTH TWICE DAILY *NEW PRESCRIPTION REQUEST* 06/16/23   Claiborne Rigg, NP  dexamethasone (DECADRON) 4 MG tablet Take 10 tablets (40 mg total) by mouth 2 (two) times  daily with a meal for 4 days. 08/01/23 08/05/23  Melene Plan, DO  docusate sodium (COLACE) 100 MG capsule Take 1 capsule (100 mg total) by mouth 2 (two) times daily as needed for mild constipation. 04/18/23   Jerald Kief, MD  levETIRAcetam (KEPPRA) 500 MG tablet TAKE ONE (1) TABLET BY MOUTH TWICE DAILY *NEW PRESCRIPTION REQUEST* 06/16/23   Claiborne Rigg, NP  Multiple Vitamin (MULTIVITAMIN) TABS TAKE 1 TABLET BY MOUTH EVERY DAY *NEW PRESCRIPTION REQUEST* 06/16/23   Claiborne Rigg, NP  multivitamin (RENA-VIT) TABS tablet Take 1 tablet by mouth at bedtime. 04/18/23   Jerald Kief, MD      Allergies    Chlorhexidine, Zyrtec [cetirizine], and Cetirizine & related    Review of Systems   Review of Systems  Constitutional:  Negative for chills, diaphoresis, fatigue and fever.  HENT:  Negative for congestion, rhinorrhea and sneezing.   Eyes: Negative.   Respiratory:  Negative for cough, chest tightness and shortness of breath.   Cardiovascular:  Negative for chest pain and leg swelling.  Gastrointestinal:  Negative for abdominal pain, blood in stool, diarrhea, nausea and vomiting.  Genitourinary:  Negative for difficulty urinating, flank pain, frequency and hematuria.  Musculoskeletal:  Negative for arthralgias and back pain.  Skin:  Negative for rash.  Neurological:  Positive for seizures. Negative for dizziness, speech difficulty, weakness, numbness and headaches.  Physical Exam Updated Vital Signs BP (!) 158/110   Pulse 80 Comment: Simultaneous filing. User may not have seen previous data.  Temp 98 F (36.7 C) (Oral) Comment: Simultaneous filing. User may not have seen previous data. Comment (Src): Simultaneous filing. User may not have seen previous data.  Resp 12   Wt 104.3 kg   SpO2 95%   BMI 31.19 kg/m  Physical Exam Constitutional:      Appearance: He is well-developed.  HENT:     Head: Normocephalic and atraumatic.     Mouth/Throat:     Comments: Small abrasion to  the left tongue, no active bleeding Eyes:     Pupils: Pupils are equal, round, and reactive to light.  Cardiovascular:     Rate and Rhythm: Normal rate and regular rhythm.     Heart sounds: Normal heart sounds.  Pulmonary:     Effort: Pulmonary effort is normal. No respiratory distress.     Breath sounds: Normal breath sounds. No wheezing or rales.  Chest:     Chest wall: No tenderness.  Abdominal:     General: Bowel sounds are normal.     Palpations: Abdomen is soft.     Tenderness: There is no abdominal tenderness. There is no guarding or rebound.  Musculoskeletal:        General: Normal range of motion.     Cervical back: Normal range of motion and neck supple.  Lymphadenopathy:     Cervical: No cervical adenopathy.  Skin:    General: Skin is warm and dry.     Findings: No rash.  Neurological:     General: No focal deficit present.     Mental Status: He is alert and oriented to person, place, and time.     ED Results / Procedures / Treatments   Labs (all labs ordered are listed, but only abnormal results are displayed) Labs Reviewed  CBC - Abnormal; Notable for the following components:      Result Value   RDW 16.2 (*)    Platelets 25 (*)    nRBC 0.4 (*)    All other components within normal limits  RAPID URINE DRUG SCREEN, HOSP PERFORMED  BASIC METABOLIC PANEL  CBC  CBG MONITORING, ED    EKG None  Radiology DG Chest Port 1 View Result Date: 08/01/2023 CLINICAL DATA:  Seizure. EXAM: PORTABLE CHEST 1 VIEW COMPARISON:  March 28, 2023 FINDINGS: There is stable right-sided venous catheter positioning. The cardiac silhouette is mildly enlarged and unchanged in size. Both lungs are clear. The visualized skeletal structures are unremarkable. IMPRESSION: No active disease. Electronically Signed   By: Aram Candela M.D.   On: 08/01/2023 18:05   CT Head Wo Contrast Result Date: 08/01/2023 CLINICAL DATA:  Head trauma, moderate-severe fall, on eliquis, low platelets  EXAM: CT HEAD WITHOUT CONTRAST TECHNIQUE: Contiguous axial images were obtained from the base of the skull through the vertex without intravenous contrast. RADIATION DOSE REDUCTION: This exam was performed according to the departmental dose-optimization program which includes automated exposure control, adjustment of the mA and/or kV according to patient size and/or use of iterative reconstruction technique. COMPARISON:  Brain MR 03/30/2023 FINDINGS: Brain: No hemorrhage. No hydrocephalus. No extra-axial fluid collection. No mass effect. No mass lesion. No CT evidence of an acute cortical infarct. 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 There is also a chronic infarct in the left insular region. Generalized volume loss. Vascular: No hyperdense vessel or unexpected calcification. Skull:  Lung along the posterior scalp. No evidence of an underlying calvarial fracture. Sinuses/Orbits: No middle ear or mastoid effusion. Paranasal sinuses are clear. Orbits are unremarkable. Other: None. IMPRESSION: 1. No CT evidence of intracranial injury. 2. Posterior scalp hematoma without evidence of an underlying calvarial fracture. Electronically Signed   By: Lorenza Cambridge M.D.   On: 08/01/2023 18:01    Procedures Procedures    Medications Ordered in ED Medications  atorvastatin (LIPITOR) tablet 40 mg (has no administration in time range)  carvedilol (COREG) tablet 6.25 mg (has no administration in time range)  dexamethasone (DECADRON) tablet 40 mg (has no administration in time range)  levETIRAcetam (KEPPRA) tablet 500 mg (has no administration in time range)  sodium chloride flush (NS) 0.9 % injection 3 mL (has no administration in time range)  acetaminophen (TYLENOL) tablet 650 mg (has no administration in time range)    Or  acetaminophen (TYLENOL) suppository 650 mg (has no administration in time range)  HYDROmorphone (DILAUDID) injection 0.5 mg (has no administration in time range)   oxyCODONE (Oxy IR/ROXICODONE) immediate release tablet 5 mg (has no administration in time range)  senna (SENOKOT) tablet 8.6 mg (has no administration in time range)  levETIRAcetam (KEPPRA) IVPB 1000 mg/100 mL premix (0 mg Intravenous Stopped 08/01/23 1721)    ED Course/ Medical Decision Making/ A&P                                 Medical Decision Making Amount and/or Complexity of Data Reviewed Labs: ordered. Radiology: ordered.  Risk Prescription drug management. Decision regarding hospitalization.   Patient is a 43 year old who presents with a seizure.  He was here earlier today and left and had another seizure out in the parking lot.  He is noted to have thrombocytopenia.  I rechecked his labs and his platelet count is 23.  He has previously been loaded today with Decadron after consultation with the hematology clinic.  Given that he is on Eliquis and he has thrombocytopenia, I did do a head CT which did not show any acute abnormalities.  No hemorrhage.  He is noted to be mildly hypoxic with oxygen saturation of 87 to 88% on room air.  Per chart review, he has had some aspiration previously.  Chest x-ray was performed which was interpreted by me and confirmed by the radiologist to show no pneumonia, no aspiration, no vomiting or edema.  He possibly has occult aspiration.  He does not have tachycardia, chest pain, or other symptoms that would be more concerning for PE.  In addition, he is on Eliquis.  He does not report shortness of breath or increased work of breathing.  He was loaded with IV Keppra.  I did speak with Dr. Derry Lory, with neurology who recommended that patient be restarted on his Keppra.  He can remain at [facility].  He did not feel that neurology needs to see the patient given that it seems that the patient is possibly noncompliant with his medication.  If the patient continues to have seizures despite getting started back on Keppra, it would be prudent for neurology to  get involved at that point.  I also did speak with Dr. Arlana Pouch with hematology who will see the patient tomorrow.  I spoke with Dr. Antionette Char with the hospitalist service to admit the patient for further treatment.  Final Clinical Impression(s) / ED Diagnoses Final diagnoses:  Seizure (HCC)  Hypoxia  Thrombocytopenia (  [facility])    Rx / DC Orders ED Discharge Orders     None         Rolan Bucco, MD 08/01/23 [phone]

## 2023-08-01 NOTE — ED Notes (Signed)
Contacted [facility] for transport as patient is willing to be admitted at this time

## 2023-08-01 NOTE — ED Notes (Signed)
Charge RN Maralyn Sago will call EMS. Pt informed

## 2023-08-01 NOTE — ED Notes (Addendum)
error 

## 2023-08-01 NOTE — ED Notes (Signed)
MD Adela Lank updated on IV access. Will try to find another person to place IV

## 2023-08-01 NOTE — ED Notes (Addendum)
Pt d/c.Stable condition at time of d/c.  Ambulatory, steady gait. In rush to leave because ride is waiting. No questions. Pt escorted to lobby to registration.

## 2023-08-01 NOTE — Discharge Instructions (Signed)
Follow with the hematologist in the office.  Return for repeat seziure. Follow up with your neurologist.

## 2023-08-01 NOTE — H&P (Signed)
History and Physical    JUNIOUS RAGONE [name]:[id number] DOB: Jul 19, 1980 DOA: 08/01/2023  PCP: Claiborne Rigg, NP   Patient coming from: Home   Chief Complaint: [name] [name]   HPI: [name] [name] is a 43 y.o. male with medical history significant for hypertension, lupus, history of CVAs, positive lupus anticoagulant on Eliquis, seizure disorder, and history of nonadherence with his medications who presents after a suspected seizure.  Patient completed dialysis earlier today, developed lightheadedness, sat down, and was then observed to have a generalized seizure.  He was evaluated for this in the emergency department, had returned to baseline, and was discharged home.  Shortly after this, he was found down outside with mild confusion and was slow to answer questions.    At time of admission, patient reports feeling back to baseline again. He denies SOB or cough, and denies headache or focal numbness or weakness.    ED Course: Upon arrival to the ED, patient is found to be afebrile with normal RR, normal HR, and stable BP but requiring supplemental oxygen to maintain adequate saturations.  Labs are most notable for platelets 23,000.  Patient was loaded with 1000 mg IV Keppra.  ED physician discussed the case with neurology who advised having the patient resume his usual dose of Keppra.  Case was also discussed with hematology who recommended high-dose Decadron.  Review of Systems:  All other systems reviewed and apart from HPI, are negative.  Past Medical History:  Diagnosis Date   ESRD on hemodialysis (HCC)    Hypertension    Lupus    [name] [name] (HCC)    [name] [name] (HCC)    Stroke (HCC)    76 or 42 years of age.     Wears glasses     Past Surgical History:  Procedure Laterality Date   AV FISTULA PLACEMENT Left 12/30/2017   Procedure: ARTERIOVENOUS (AV) FISTULA CREATION VERSUS INSERTION OF ARTERIOVENOUS GRAFT LEFT ARM;  Surgeon: Larina Earthly, MD;  Location: [facility] OR;  Service: Vascular;   Laterality: Left;   BASCILIC VEIN TRANSPOSITION Left 08/15/2018   Procedure: LEFT FIRST STAGE BASCILIC VEIN TRANSPOSITION;  Surgeon: Larina Earthly, MD;  Location: [facility] OR;  Service: Vascular;  Laterality: Left;   BASCILIC VEIN TRANSPOSITION Left 10/30/2018   Procedure: INSERTION OF GORE-TEX GRAFT LEFT UPPER ARM;  Surgeon: Maeola Harman, MD;  Location[facility] OR;  Service: Vascular;  Laterality: Left;   INSERTION OF DIALYSIS CATHETER N/A 09/20/2018   Procedure: INSERTION OF TUNNELED DIALYSIS CATHETER RIGHT INTERNAL JUGULAR;  Surgeon: Cephus Shelling, MD;  Location: [facility] OR;  Service: Vascular;  Laterality: N/A;   IR FLUORO GUIDE CV LINE RIGHT  07/16/2022   IR US GUIDE VASC ACCESS RIGHT  07/16/2022   RENAL BIOPSY      Social History:   reports that he has an unknown smoking status. He has never used smokeless tobacco. He reports that he does not currently use alcohol. He reports that he does not use drugs.  Allergies  Allergen Reactions   Chlorhexidine Itching   Zyrtec [Cetirizine] Swelling    FACIAL SWELLING, NOT CATEGORIZED   Cetirizine & Related Swelling    Family History  Problem Relation Age of Onset   Diabetes Mother    Diabetes Maternal Grandmother    [name] [name] Neg Hx        not on mother's side. he doesn't know about his father's side     Prior to Admission medications   Medication Sig Start Date End Date  Taking? Authorizing Provider  apixaban (ELIQUIS) 5 MG TABS tablet TAKE ONE (1) TABLET BY MOUTH TWICE DAILY *NEW PRESCRIPTION REQUEST* 06/16/23  Yes Claiborne Rigg, NP  acetaminophen (TYLENOL) 325 MG tablet Take 2 tablets (650 mg total) by mouth every 6 (six) hours as needed for mild pain. 02/13/23   Lonia Blood, MD  atorvastatin (LIPITOR) 40 MG tablet Take 1 tablet (40 mg total) by mouth daily. 04/18/23   Jerald Kief, MD  carvedilol (COREG) 6.25 MG tablet TAKE ONE (1) TABLET BY MOUTH TWICE DAILY *NEW PRESCRIPTION REQUEST* 06/16/23   Claiborne Rigg, NP   dexamethasone (DECADRON) 4 MG tablet Take 10 tablets (40 mg total) by mouth 2 (two) times daily with a meal for 4 days. 08/01/23 08/05/23  Melene Plan, DO  docusate sodium (COLACE) 100 MG capsule Take 1 capsule (100 mg total) by mouth 2 (two) times daily as needed for mild constipation. 04/18/23   Jerald Kief, MD  levETIRAcetam (KEPPRA) 500 MG tablet TAKE ONE (1) TABLET BY MOUTH TWICE DAILY *NEW PRESCRIPTION REQUEST* 06/16/23   Claiborne Rigg, NP  Multiple Vitamin (MULTIVITAMIN) TABS TAKE 1 TABLET BY MOUTH EVERY DAY *NEW PRESCRIPTION REQUEST* 06/16/23   Claiborne Rigg, NP  multivitamin (RENA-VIT) TABS tablet Take 1 tablet by mouth at bedtime. 04/18/23   Jerald Kief, MD    Physical Exam: Vitals:   08/01/23 1622 08/01/23 1625 08/01/23 1700  BP: (!) 165/105  (!) 158/110  Pulse: 80    Resp: 16  12  Temp: 98 F (36.7 C)    TempSrc: Oral    SpO2: 95%    Weight:  104.3 kg     Constitutional: NAD, no pallor or diaphoresis   Eyes: PERTLA, lids and conjunctivae normal ENMT: Mucous membranes are moist. Posterior pharynx clear of any exudate or lesions.   Neck: supple, no masses  Respiratory: no wheezing, no crackles. No accessory muscle use.  Cardiovascular: S1 & S2 heard, regular rate and rhythm. Extremities warm. Abdomen: No distension, no tenderness, soft. Bowel sounds active.  Musculoskeletal: no clubbing / cyanosis. No joint deformity upper and lower extremities.   Skin: no significant rashes, lesions, ulcers. Dry, well-perfused. Neurologic: CN 2-12 grossly intact. Sensation to light touch intact. Strength 5/5 in all 4 limbs. Alert and oriented.  Psychiatric: Calm. Cooperative.    Labs and Imaging on Admission: I have personally reviewed following labs and imaging studies  CBC: Recent Labs  Lab 08/01/23 1127 08/01/23 1630  WBC 7.8 7.9  NEUTROABS 5.6  --   HGB 12.5* 13.3  HCT 39.3 43.2  MCV 97.5 100.0  PLT 20* 25*  23*   Basic Metabolic Panel: Recent Labs  Lab  08/01/23 1127  NA 138  K 3.5  CL 96*  CO2 30  GLUCOSE 98  BUN 25*  CREATININE 8.63*  CALCIUM 9.2   GFR: Estimated Creatinine Clearance: 13.6 mL/min (A) (by C-G formula based on SCr of 8.63 mg/dL (H)). Liver Function Tests: No results for input(s): "AST", "ALT", "ALKPHOS", "BILITOT", "PROT", "ALBUMIN" in the last 168 hours. No results for input(s): "LIPASE", "AMYLASE" in the last 168 hours. No results for input(s): "AMMONIA" in the last 168 hours. Coagulation Profile: No results for input(s): "INR", "PROTIME" in the last 168 hours. Cardiac Enzymes: No results for input(s): "CKTOTAL", "CKMB", "CKMBINDEX", "TROPONINI" in the last 168 hours. BNP (last 3 results) No results for input(s): "PROBNP" in the last 8760 hours. HbA1C: No results for input(s): "HGBA1C" in the last 72  hours. CBG: Recent Labs  Lab 08/01/23 1143 08/01/23 1624  GLUCAP 107* 86   Lipid Profile: No results for input(s): "CHOL", "HDL", "LDLCALC", "TRIG", "CHOLHDL", "LDLDIRECT" in the last 72 hours. Thyroid Function Tests: No results for input(s): "TSH", "T4TOTAL", "FREET4", "T3FREE", "THYROIDAB" in the last 72 hours. Anemia Panel: No results for input(s): "VITAMINB12", "FOLATE", "FERRITIN", "TIBC", "IRON", "RETICCTPCT" in the last 72 hours. Urine analysis:    Component Value Date/Time   COLORURINE YELLOW 04/28/2023 1803   APPEARANCEUR TURBID (A) 04/28/2023 1803   LABSPEC 1.014 04/28/2023 1803   PHURINE 8.0 04/28/2023 1803   GLUCOSEU NEGATIVE 04/28/2023 1803   HGBUR MODERATE (A) 04/28/2023 1803   BILIRUBINUR NEGATIVE 04/28/2023 1803   KETONESUR NEGATIVE 04/28/2023 1803   PROTEINUR 100 (A) 04/28/2023 1803   UROBILINOGEN 0.2 05/09/2013 1857   NITRITE NEGATIVE 04/28/2023 1803   LEUKOCYTESUR MODERATE (A) 04/28/2023 1803   Sepsis Labs: @LABRCNTIP (procalcitonin:4,lacticidven:4) )No results found for this or any previous visit (from the past 240 hours).   Radiological Exams on Admission: DG Chest Port 1  View Result Date: 08/01/2023 CLINICAL DATA:  Seizure. EXAM: PORTABLE CHEST 1 VIEW COMPARISON:  March 28, 2023 FINDINGS: There is stable right-sided venous catheter positioning. The cardiac silhouette is mildly enlarged and unchanged in size. Both lungs are clear. The visualized skeletal structures are unremarkable. IMPRESSION: No active disease. Electronically Signed   By: Aram Candela M.D.   On: 08/01/2023 18:05   CT Head Wo Contrast Result Date: 08/01/2023 CLINICAL DATA:  Head trauma, moderate-severe fall, on eliquis, low platelets EXAM: CT HEAD WITHOUT CONTRAST TECHNIQUE: Contiguous axial images were obtained from the base of the skull through the vertex without intravenous contrast. RADIATION DOSE REDUCTION: This exam was performed according to the departmental dose-optimization program which includes automated exposure control, adjustment of the mA and/or kV according to patient size and/or use of iterative reconstruction technique. COMPARISON:  Brain MR 03/30/2023 FINDINGS: Brain: No hemorrhage. No hydrocephalus. No extra-axial fluid collection. No mass effect. No mass lesion. No CT evidence of an acute cortical infarct. 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 There is also a chronic infarct in the left insular region. Generalized volume loss. Vascular: No hyperdense vessel or unexpected calcification. Skull: Lung along the posterior scalp. No evidence of an underlying calvarial fracture. Sinuses/Orbits: No middle ear or mastoid effusion. Paranasal sinuses are clear. Orbits are unremarkable. Other: None. IMPRESSION: 1. No CT evidence of intracranial injury. 2. Posterior scalp hematoma without evidence of an underlying calvarial fracture. Electronically Signed   By: Lorenza Cambridge M.D.   On: 08/01/2023 18:01    EKG: Independently reviewed. Sinus rhythm, non-specific IVCD with LAD.   Assessment/Plan   1. Acute hypoxic respiratory failure - ?aspiration during seizure    - Continue supplemental O2 as-needed, use incentive spirometer, repeat CXR in am   2. [name] [name] - Has returned to baseline; no acute findings noted on head CT  - Loaded with 1,000 mg IV Keppra in ED  - Continue Keppra 500 mg BID    3. Thrombocytopenia  - Platelet count 23,000 without bleeding; had been low previously but was 162k in November  - Started on Decadron in ED, will continue with 40 mg Decadron q24h for 3 more doses, repeat CBC in am   4. ESRD  - Pt reports completing HD today  - Renally-dose medications, restrict fluids    5. Hx of CVA  - Continue Lipitor    6. Lupus anticoagulant  - Hold Eliquis until  platelet count improves    7. HTN  - Coreg    DVT prophylaxis: SCDs  Code Status: Full   Level of Care: Level of care: Telemetry Medical Family Communication: None present   Disposition Plan:  Patient is from: home  Anticipated d/c is to: Home  Anticipated d/c date is: 2/14 or 08/03/23  Patient currently: Pending improved oxygenation, improved/stable platelets  Consults called: Hematology  Admission status: Observation     Briscoe Deutscher, MD [facility]  08/01/2023, 7:15 PM

## 2023-08-01 NOTE — ED Triage Notes (Signed)
BIB [facility] from [facility] s/p sz, hx of same. Takes lamictal, took his lamictal after HD, and right before sz. A&O for EMS and on arrival. Endorses "just feeling tired". Denies pain or HA. Denies recent illness. R chest HD cath present. CBG 133.

## 2023-08-01 NOTE — ED Notes (Signed)
Urinal given to obtain urine

## 2023-08-01 NOTE — ED Notes (Signed)
Pt is upset and ststaes he came in with a wallet. This RN not present on patient arrival. Called Dialysis clinic, checked with ER registration, no ID present on arrival.

## 2023-08-01 NOTE — ED Notes (Signed)
[facility] called for transportation

## 2023-08-02 ENCOUNTER — Encounter (HOSPITAL_COMMUNITY): Payer: Self-pay | Admitting: Family Medicine

## 2023-08-02 ENCOUNTER — Telehealth: Payer: Self-pay | Admitting: Oncology

## 2023-08-02 ENCOUNTER — Observation Stay (HOSPITAL_COMMUNITY): Payer: 59

## 2023-08-02 DIAGNOSIS — N186 End stage renal disease: Secondary | ICD-10-CM | POA: Diagnosis not present

## 2023-08-02 DIAGNOSIS — R0902 Hypoxemia: Secondary | ICD-10-CM

## 2023-08-02 DIAGNOSIS — I10 Essential (primary) hypertension: Secondary | ICD-10-CM | POA: Diagnosis not present

## 2023-08-02 DIAGNOSIS — D696 Thrombocytopenia, unspecified: Secondary | ICD-10-CM | POA: Diagnosis not present

## 2023-08-02 DIAGNOSIS — R569 Unspecified convulsions: Secondary | ICD-10-CM | POA: Diagnosis not present

## 2023-08-02 DIAGNOSIS — Z8673 Personal history of transient ischemic attack (TIA), and cerebral infarction without residual deficits: Secondary | ICD-10-CM | POA: Diagnosis not present

## 2023-08-02 LAB — CBC WITH DIFFERENTIAL/PLATELET
Abs Immature Granulocytes: 0.05 10*3/uL (ref 0.00–0.07)
Basophils Absolute: 0 10*3/uL (ref 0.0–0.1)
Basophils Relative: 1 %
Eosinophils Absolute: 0 10*3/uL (ref 0.0–0.5)
Eosinophils Relative: 0 %
HCT: 37.4 % — ABNORMAL LOW (ref 39.0–52.0)
Hemoglobin: 12.3 g/dL — ABNORMAL LOW (ref 13.0–17.0)
Immature Granulocytes: 1 %
Lymphocytes Relative: 15 %
Lymphs Abs: 1.2 10*3/uL (ref 0.7–4.0)
MCH: 31.1 pg (ref 26.0–34.0)
MCHC: 32.9 g/dL (ref 30.0–36.0)
MCV: 94.4 fL (ref 80.0–100.0)
Monocytes Absolute: 0.5 10*3/uL (ref 0.1–1.0)
Monocytes Relative: 5 %
Neutro Abs: 6.7 10*3/uL (ref 1.7–7.7)
Neutrophils Relative %: 78 %
Platelets: 35 10*3/uL — ABNORMAL LOW (ref 150–400)
RBC: 3.96 MIL/uL — ABNORMAL LOW (ref 4.22–5.81)
RDW: 16 % — ABNORMAL HIGH (ref 11.5–15.5)
Smear Review: DECREASED
WBC: 8.5 10*3/uL (ref 4.0–10.5)
nRBC: 0.2 % (ref 0.0–0.2)

## 2023-08-02 LAB — CBC
HCT: 37.1 % — ABNORMAL LOW (ref 39.0–52.0)
HCT: 37.1 % — ABNORMAL LOW (ref 39.0–52.0)
Hemoglobin: 12.1 g/dL — ABNORMAL LOW (ref 13.0–17.0)
Hemoglobin: 12.2 g/dL — ABNORMAL LOW (ref 13.0–17.0)
MCH: 30.8 pg (ref 26.0–34.0)
MCH: 31 pg (ref 26.0–34.0)
MCHC: 32.6 g/dL (ref 30.0–36.0)
MCHC: 32.9 g/dL (ref 30.0–36.0)
MCV: 94.4 fL (ref 80.0–100.0)
MCV: 94.4 fL (ref 80.0–100.0)
Platelets: 35 10*3/uL — ABNORMAL LOW (ref 150–400)
Platelets: 35 K/uL — ABNORMAL LOW (ref 150–400)
RBC: 3.93 MIL/uL — ABNORMAL LOW (ref 4.22–5.81)
RBC: 3.93 MIL/uL — ABNORMAL LOW (ref 4.22–5.81)
RDW: 15.9 % — ABNORMAL HIGH (ref 11.5–15.5)
RDW: 16.1 % — ABNORMAL HIGH (ref 11.5–15.5)
WBC: 8.2 10*3/uL (ref 4.0–10.5)
WBC: 9.1 K/uL (ref 4.0–10.5)
nRBC: 0.2 % (ref 0.0–0.2)
nRBC: 0 % (ref 0.0–0.2)

## 2023-08-02 LAB — BASIC METABOLIC PANEL
Anion gap: 15 (ref 5–15)
BUN: 36 mg/dL — ABNORMAL HIGH (ref 6–20)
CO2: 24 mmol/L (ref 22–32)
Calcium: 9.5 mg/dL (ref 8.9–10.3)
Chloride: 96 mmol/L — ABNORMAL LOW (ref 98–111)
Creatinine, Ser: 11.73 mg/dL — ABNORMAL HIGH (ref 0.61–1.24)
GFR, Estimated: 5 mL/min — ABNORMAL LOW (ref >60)
Glucose, Bld: 156 mg/dL — ABNORMAL HIGH (ref 70–99)
Potassium: 3.8 mmol/L (ref 3.5–5.1)
Sodium: 135 mmol/L (ref 135–145)

## 2023-08-02 LAB — DIFFERENTIAL
Abs Immature Granulocytes: 0.03 10*3/uL (ref 0.00–0.07)
Basophils Absolute: 0.1 10*3/uL (ref 0.0–0.1)
Basophils Relative: 1 %
Eosinophils Absolute: 0 10*3/uL (ref 0.0–0.5)
Eosinophils Relative: 0 %
Immature Granulocytes: 0 %
Lymphocytes Relative: 18 %
Lymphs Abs: 1.6 10*3/uL (ref 0.7–4.0)
Monocytes Absolute: 1.1 10*3/uL — ABNORMAL HIGH (ref 0.1–1.0)
Monocytes Relative: 12 %
Neutro Abs: 6.3 10*3/uL (ref 1.7–7.7)
Neutrophils Relative %: 69 %

## 2023-08-02 LAB — TECHNOLOGIST SMEAR REVIEW: Plt Morphology: DECREASED

## 2023-08-02 LAB — HEPATITIS B SURFACE ANTIGEN: Hepatitis B Surface Ag: NONREACTIVE

## 2023-08-02 LAB — MRSA NEXT GEN BY PCR, NASAL: MRSA by PCR Next Gen: DETECTED — AB

## 2023-08-02 LAB — LAMOTRIGINE LEVEL: Lamotrigine Lvl: <1 ug/mL — ABNORMAL LOW (ref 2.0–20.0)

## 2023-08-02 MED ORDER — MUPIROCIN 2 % EX OINT
1.0000 | TOPICAL_OINTMENT | Freq: Two times a day (BID) | CUTANEOUS | Status: DC
  Administered 2023-08-02 (×2): 1 via NASAL
  Filled 2023-08-02 (×3): qty 22

## 2023-08-02 MED ORDER — ORAL CARE MOUTH RINSE: 15.0000 mL | OROMUCOSAL | Status: DC | PRN

## 2023-08-02 NOTE — Progress Notes (Signed)
Pt receives out-pt HD at [facility] on MWF. Will assist as needed.   Olivia Canter Renal Navigator [phone]

## 2023-08-02 NOTE — Consult Note (Signed)
Renal Service Consult Note [facility]  [name] [name] 08/02/2023 Maree Krabbe, MD Requesting Physician: Dr. Clide Dales  Reason for Consult: ESRD pt w/  HPI: The patient is a 43 y.o. year-old w/ PMH as below who presented to ED after having a seizure after HD 2/13. Pt was lightheaded after dialysis and sat down, then was observed to have a gen'd seizure. He was taken to the ED, had returned to baseline and was dc'd home. Later was found down outside w/ mild confusion. In ED afeb, normal RR / HR, BP stable, required supp O2. Low plts 23K. Pt loaded w/ IV keppra. Pt was admitted. We are asked to see the pt for dialysis.     Pt states he has long hx of seizures, has had "a lot" of seizures in his lifetime. He had just taken his seizure medication after a supplemental HD yesterday off schedule when he apparently slipped to the floor and had a seizure. He had just finished HD. He heard somebody say that they had "taken too much off". He was there for an extra Rx for volume overload (had HD Monday and wed too this week). He take keppra for seizures.   He is feeling "great" today. His L upper arm access is not working and he is using a TDC in the R chest, has appt w/ vasc surgeons in March to try something new.     ROS - denies CP, no joint pain, no HA, no blurry vision, no rash, no diarrhea, no nausea/ vomiting, no dysuria, no difficulty voiding   Past Medical History  Past Medical History:  Diagnosis Date   ESRD on hemodialysis (HCC)    Hypertension    Lupus    Seizures (HCC)    Seizures (HCC)    Stroke (HCC)    49 or 43 years of age.     Wears glasses    Past Surgical History  Past Surgical History:  Procedure Laterality Date   AV FISTULA PLACEMENT Left 12/30/2017   Procedure: ARTERIOVENOUS (AV) FISTULA CREATION VERSUS INSERTION OF ARTERIOVENOUS GRAFT LEFT ARM;  Surgeon: Larina Earthly, MD;  Location: [facility] OR;  Service: Vascular;  Laterality: Left;   BASCILIC VEIN  TRANSPOSITION Left 08/15/2018   Procedure: LEFT FIRST STAGE BASCILIC VEIN TRANSPOSITION;  Surgeon: Larina Earthly, MD;  Location: [facility] OR;  Service: Vascular;  Laterality: Left;   BASCILIC VEIN TRANSPOSITION Left 10/30/2018   Procedure: INSERTION OF GORE-TEX GRAFT LEFT UPPER ARM;  Surgeon: Maeola Harman, MD;  Location: [facility] OR;  Service: Vascular;  Laterality: Left;   INSERTION OF DIALYSIS CATHETER N/A 09/20/2018   Procedure: INSERTION OF TUNNELED DIALYSIS CATHETER RIGHT INTERNAL JUGULAR;  Surgeon: Cephus Shelling, MD;  Location: [facility] OR;  Service: Vascular;  Laterality: N/A;   IR FLUORO GUIDE CV LINE RIGHT  07/16/2022   IR US GUIDE VASC ACCESS RIGHT  07/16/2022   RENAL BIOPSY     Family History  Family History  Problem Relation Age of Onset   Diabetes Mother    Diabetes Maternal Grandmother    Seizures Neg Hx        not on mother's side. he doesn't know about his father's side   Social History  reports that he has been smoking e-cigarettes. He has never used smokeless tobacco. He reports that he does not currently use alcohol. He reports that he does not use drugs. Allergies  Allergies  Allergen Reactions   Chlorhexidine Itching   Zyrtec [Cetirizine]  Swelling    FACIAL SWELLING, NOT CATEGORIZED   Cetirizine & Related Swelling   Home medications Prior to Admission medications   Medication Sig Start Date End Date Taking? Authorizing Provider  acetaminophen (TYLENOL) 325 MG tablet Take 2 tablets (650 mg total) by mouth every 6 (six) hours as needed for mild pain. 02/13/23  Yes Lonia Blood, MD  apixaban (ELIQUIS) 5 MG TABS tablet TAKE ONE (1) TABLET BY MOUTH TWICE DAILY *NEW PRESCRIPTION REQUEST* 06/16/23  Yes Claiborne Rigg, NP  atorvastatin (LIPITOR) 40 MG tablet Take 1 tablet (40 mg total) by mouth daily. 04/18/23  Yes Jerald Kief, MD  carvedilol (COREG) 6.25 MG tablet TAKE ONE (1) TABLET BY MOUTH TWICE DAILY *NEW PRESCRIPTION REQUEST* 06/16/23  Yes Claiborne Rigg,  NP  levETIRAcetam (KEPPRA) 500 MG tablet TAKE ONE (1) TABLET BY MOUTH TWICE DAILY *NEW PRESCRIPTION REQUEST* 06/16/23  Yes Claiborne Rigg, NP  dexamethasone (DECADRON) 4 MG tablet Take 10 tablets (40 mg total) by mouth 2 (two) times daily with a meal for 4 days. Patient not taking: Reported on 08/02/2023 08/01/23 08/05/23  Melene Plan, DO  docusate sodium (COLACE) 100 MG capsule Take 1 capsule (100 mg total) by mouth 2 (two) times daily as needed for mild constipation. Patient not taking: Reported on 08/02/2023 04/18/23   Jerald Kief, MD  multivitamin (RENA-VIT) TABS tablet Take 1 tablet by mouth at bedtime. Patient not taking: Reported on 08/02/2023 04/18/23   Jerald Kief, MD     Vitals:   08/01/23 2300 08/02/23 0350 08/02/23 0811 08/02/23 1106  BP: (!) 146/99 (!) 135/92 123/72 129/85  Pulse: 77 (!) 103 85 73  Resp: 17 17 20 18   Temp: 98.1 F (36.7 C) 98 F (36.7 C) 98.4 F (36.9 C) 98.2 F (36.8 C)  TempSrc: Oral Oral Oral Oral  SpO2: 96% 100% 100% 96%  Weight:      Height:       Exam Gen alert, no distress No rash, cyanosis or gangrene Sclera anicteric, throat clear  No jvd or bruits Chest clear bilat to bases, no rales/ wheezing RRR no MRG Abd soft ntnd no mass or ascites +bs GU nl male MS no joint effusions or deformity Ext no  LE or UE edema, no other edema Neuro is alert, Ox 3 , nf    RIJ TDC       Renal-related home meds: - renavite - coreg 6.25 bid - others: keppra, lipitor, colace    OP HD: [facility] MWF 4h  B400  107.5kg  2K bath  RIJ TDC Hep none    Assessment/ Plan: Seizures - breakthrough seizure in this pt w/ hx of seizure d/o. Got IV keppra load and continues on po keppra maint dose.   H/o APLA syndrome - has struggled w/ medication adherence, on eliquis now. Holding until plts improve.  Thrombocytopenia - plts are now 23K, no bleeding. Started on decadron in ED.  ESRD - on HD MWF. Last HD Wednesday. Due for HD today. Will put him on the list  for dialysis, may be tonight or tomorrow.  HTN - takes only low dose coreg for BP lowering meds.  Volume - up 7 kg by wts. Get standing wt pre HD. Doesn't look real vol overloaded on exam.  Anemia of esrd - Hb 12- 13, no esa needs.  Secondary hyperparathyroidism - Ca in range. Cont binders w/ meals.  H/o CVA      Vinson Moselle  MD [facility]  08/02/2023, 1:10 PM  Recent Labs  Lab 08/01/23 1127 08/01/23 1630 08/02/23 0748 08/02/23 0800 08/02/23 1031  HGB 12.5*   < > 12.1* 12.3* 12.2*  CALCIUM 9.2  --  9.5  --   --   CREATININE 8.63*  --  11.73*  --   --   K 3.5  --  3.8  --   --    < > = values in this interval not displayed.   Inpatient medications:  atorvastatin  40 mg Oral Daily   carvedilol  6.25 mg Oral BID WC   dexamethasone  40 mg Oral Q24H   levETIRAcetam  500 mg Oral BID   mupirocin ointment  1 Application Nasal BID   sodium chloride flush  3 mL Intravenous Q12H    acetaminophen **OR** acetaminophen, HYDROmorphone (DILAUDID) injection, mouth rinse, oxyCODONE, senna

## 2023-08-02 NOTE — Care Management Obs Status (Signed)
MEDICARE OBSERVATION STATUS NOTIFICATION   Patient Details  Name: [name] [name] MRN: [id number] Date of Birth: 12-17-1980   Medicare Observation Status Notification Given:  Yes    Kermit Balo, RN 08/02/2023, 2:09 PM

## 2023-08-02 NOTE — Consult Note (Signed)
[facility]  [phone] [phone]    HEMATOLOGY CONSULTATION  PURPOSE OF CONSULTATION/CHIEF COMPLAINT:  Thrombocytopenia  Referring MD: Dr. Clide [name]   HPI: [name] is a 43 year old male patient who was admitted through the ED on 08/01/2023 after a suspected seizure.  Patient had just completed dialysis earlier in the day when he became dizzy and was observed to have a generalized seizure. Workup was done in the ED including CBC with platelet count was found to be low at 20K.  Multiple rechecks showed platelet count in the range of 23-35K.  Therefore hematology consult has been requested.  Patient is seen and assessed today awake and alert laying in bed with bumpers for safety intact on each side of bed railing.  Patient reports chief complaint as previously stated, reports that he was receiving an extra day of dialysis yesterday.   Medical history significant for end-stage renal disease which he states he has had for 5 to 6 years and has been on dialysis during that time.  According to patient he is not on a kidney transplant list due to having many seizures.  Admits to being compliant on Keppra.  States that he thinks that they took too much fluid off and that was the reason for his seizure yesterday.  Also diagnosed with hypertension.  He does admit to feeling much better today. Surgical history includes left upper arm hemodialysis access.  Denies any other surgical history. Family history is noncontributory, denies history of blood disorders.   Social history includes vaping less than once per week, social alcohol use.  Denies illicit drug use.  Previously worked at [employer], denies exposure to hazardous material.   ASSESSMENT AND PLAN: 1.  Thrombocytopenia - May be due to viral?  Or medication-induced - Patient reports a cold and cough 2 weeks ago which he has subsequently recovered from. - Platelets low 20K on admission 08/01/2023 with  multiple rechecks in the 23-35K range.  Most recent platelet count today 08/02/2023 is 35K. - Baseline platelets within normal range 162K November 2024. - Dexamethasone 40 mg p.o. started on 08/02/2023, continue x 3 more doses. - Transfuse platelets for counts <10 K or <50 K with active bleeding.  No transfusional intervention warranted at this time. - Hematology/Dr. Pasam, following closely.  Recommends outpatient follow-up on 08/08/2023 for which patient is agreeable.  2.  History of seizures - On Keppra.  Status post 1000 mg IV given 08/01/2023. - Continue oral Keppra 500 mg p.o. twice daily as ordered. - Continue supportive care - Follow-up with neurology  3.  End-stage renal disease - On hemodialysis Monday Wednesday Fridays - Continue close nephrology follow-up as ordered  4.  Positive lupus anticoagulant - On Eliquis  5.  History of CVAs -On statin.   Past Medical History:  Diagnosis Date   ESRD on hemodialysis (HCC)    Hypertension    Lupus    Seizures (HCC)    Seizures (HCC)    Stroke (HCC)    11 or 43 years of age.     Wears glasses   :  Past Surgical History:  Procedure Laterality Date   AV FISTULA PLACEMENT Left 12/30/2017   Procedure: ARTERIOVENOUS (AV) FISTULA CREATION VERSUS INSERTION OF ARTERIOVENOUS GRAFT LEFT ARM;  Surgeon: [name] [name] Earthly, MD;  Location: [facility] OR;  Service: Vascular;  Laterality: Left;   BASCILIC VEIN TRANSPOSITION Left 08/15/2018   Procedure: LEFT FIRST STAGE BASCILIC VEIN TRANSPOSITION;  Surgeon: [name] [name] Earthly, MD;  Location: [facility] OR;  Service: Vascular;  Laterality: Left;   BASCILIC VEIN TRANSPOSITION Left 10/30/2018   Procedure: INSERTION OF GORE-TEX GRAFT LEFT UPPER ARM;  Surgeon: [name] [name] Harman, MD;  Location: [facility] OR;  Service: Vascular;  Laterality: Left;   INSERTION OF DIALYSIS CATHETER N/A 09/20/2018   Procedure: INSERTION OF TUNNELED DIALYSIS CATHETER RIGHT INTERNAL JUGULAR;  Surgeon: [name] [name] Shelling, MD;  Location: [facility] OR;   Service: Vascular;  Laterality: N/A;   IR FLUORO GUIDE CV LINE RIGHT  07/16/2022   IR US GUIDE VASC ACCESS RIGHT  07/16/2022   RENAL BIOPSY    :  Allergies  Allergen Reactions   Chlorhexidine Itching   Zyrtec [Cetirizine] Swelling    FACIAL SWELLING, NOT CATEGORIZED   Cetirizine & Related Swelling  :   Family History  Problem Relation Age of Onset   Diabetes Mother    Diabetes Maternal Grandmother    Seizures Neg Hx        not on mother's side. he doesn't know about his father's side  :   Social History   Socioeconomic History   Marital status: Single    Spouse name: Not on file   Number of children: 0   Years of education: Not on file   Highest education level: High school graduate  Occupational History   Not on file  Tobacco Use   Smoking status: Some Days    Types: E-cigarettes   Smokeless tobacco: Never  Vaping Use   Vaping status: Some Days  Substance and Sexual Activity   Alcohol use: Not Currently   Drug use: No   Sexual activity: Not Currently  Other Topics Concern   Not on file  Social History Narrative   ** Merged History Encounter **       ** Merged History Encounter **       Lives with a friend Right handed Caffeine: rare   Social Drivers of Corporate investment banker Strain: Low Risk  (03/26/2023)   Overall Financial Resource Strain (CARDIA)    Difficulty of Paying Living Expenses: Not hard at all  Food Insecurity: No Food Insecurity (08/01/2023)   Hunger Vital Sign    Worried About Running Out of Food in the Last Year: Never true    Ran Out of Food in the Last Year: Never true  Transportation Needs: No Transportation Needs (08/01/2023)   PRAPARE - Administrator, Civil Service (Medical): No    Lack of Transportation (Non-Medical): No  Physical Activity: Inactive (03/26/2023)   Exercise Vital Sign    Days of Exercise per Week: 0 days    Minutes of Exercise per Session: 0 min  Stress: No Stress Concern Present (03/26/2023)    [employer] of Occupational Health - Occupational Stress Questionnaire    Feeling of Stress : Not at all  Social Connections: Moderately Isolated (03/26/2023)   Social Connection and Isolation Panel [NHANES]    Frequency of Communication with Friends and Family: More than three times a week    Frequency of Social Gatherings with Friends and Family: Three times a week    Attends Religious Services: 1 to 4 times per year    Active Member of Clubs or Organizations: No    Attends Banker Meetings: Never    Marital Status: Never married  Intimate Partner Violence: Not At Risk (08/01/2023)   Humiliation, Afraid, Rape, and Kick questionnaire    Fear of Current or Ex-Partner: No    Emotionally  Abused: No    Physically Abused: No    Sexually Abused: No  :   CURRENT MEDS: Current Facility-Administered Medications  Medication Dose Route Frequency Provider Last Rate Last Admin   acetaminophen (TYLENOL) tablet 650 mg  650 mg Oral Q6H PRN Opyd, Lavone Neri, MD       Or   acetaminophen (TYLENOL) suppository 650 mg  650 mg Rectal Q6H PRN Opyd, Lavone Neri, MD       atorvastatin (LIPITOR) tablet 40 mg  40 mg Oral Daily Opyd, Lavone Neri, MD   40 mg at 08/02/23 0954   carvedilol (COREG) tablet 6.25 mg  6.25 mg Oral BID WC Opyd, Lavone Neri, MD   6.25 mg at 08/02/23 0954   dexamethasone (DECADRON) tablet 40 mg  40 mg Oral Q24H Opyd, Lavone Neri, MD       HYDROmorphone (DILAUDID) injection 0.5 mg  0.5 mg Intravenous Q4H PRN Opyd, Lavone Neri, MD       levETIRAcetam (KEPPRA) tablet 500 mg  500 mg Oral BID Opyd, Lavone Neri, MD   500 mg at 08/02/23 0954   mupirocin ointment (BACTROBAN) 2 % 1 Application  1 Application Nasal BID Opyd, Lavone Neri, MD   1 Application at 08/02/23 8469   Oral care mouth rinse  15 mL Mouth Rinse PRN Opyd, Lavone Neri, MD       oxyCODONE (Oxy IR/ROXICODONE) immediate release tablet 5 mg  5 mg Oral Q6H PRN Opyd, Lavone Neri, MD       senna (SENOKOT) tablet 8.6 mg  1 tablet Oral  Daily PRN Opyd, Lavone Neri, MD       sodium chloride flush (NS) 0.9 % injection 3 mL  3 mL Intravenous Q12H Opyd, Lavone Neri, MD   3 mL at 08/02/23 0956    REVIEW OF SYSTEMS:   Constitutional: Denies fevers, chills or abnormal night sweats Eyes: Denies blurriness of vision, double vision or watery eyes Ears, nose, mouth, throat, and face: Denies mucositis or sore throat Respiratory: Denies cough, dyspnea or wheezes Cardiovascular: Denies palpitation, chest discomfort or lower extremity swelling Gastrointestinal: Denies nausea, heartburn or change in bowel habits Skin: Denies abnormal skin rashes Lymphatics: Denies new lymphadenopathy or easy bruising Neurological: Denies numbness, tingling or new weaknesses Behavioral/Psych: Mood is stable, no new changes  All other systems were reviewed with the patient and are negative.  PHYSICAL EXAMINATION: ECOG PERFORMANCE STATUS: 1 - Symptomatic but completely ambulatory  Vitals:   08/02/23 0811 08/02/23 1106  BP: 123/72 129/85  Pulse: 85 73  Resp: 20 18  Temp: 98.4 F (36.9 C) 98.2 F (36.8 C)  SpO2: 100% 96%   Filed Weights   08/01/23 1625 08/01/23 2300  Weight: 229 lb 15 oz (104.3 kg) 253 lb 14.4 oz (115.2 kg)    GENERAL: alert, no distress and comfortable SKIN: skin color, texture, turgor are normal, no rashes or significant lesions EYES: normal, conjunctiva are pink and non-injected, sclera clear OROPHARYNX: no exudate, no erythema and lips, buccal mucosa, and tongue normal  NECK: supple, thyroid normal size, non-tender, without nodularity LYMPH: no palpable lymphadenopathy in the cervical, axillary or inguinal LUNGS: clear to auscultation and percussion with normal breathing effort HEART: regular rate & rhythm and no murmurs and no lower extremity edema ABDOMEN: abdomen soft, non-tender and normal bowel sounds MUSCULOSKELETAL: no cyanosis of digits and no clubbing  PSYCH: alert & oriented x 3 with fluent speech NEURO: no focal  motor/sensory deficits   LABS: Lab Results  Component Value  Date   WBC 9.1 08/02/2023   HGB 12.2 (L) 08/02/2023   HCT 37.1 (L) 08/02/2023   MCV 94.4 08/02/2023   PLT 35 (L) 08/02/2023    Lab Results  Component Value Date   WBC 9.1 08/02/2023   HGB 12.2 (L) 08/02/2023   HCT 37.1 (L) 08/02/2023   PLT 35 (L) 08/02/2023   GLUCOSE 156 (H) 08/02/2023   CHOL 206 (H) 01/27/2023   TRIG 371 (H) 01/27/2023   HDL 27 (L) 01/27/2023   LDLCALC 105 (H) 01/27/2023   ALT 20 04/11/2023   AST 21 04/11/2023   NA 135 08/02/2023   K 3.8 08/02/2023   CL 96 (L) 08/02/2023   CREATININE 11.73 (H) 08/02/2023   BUN 36 (H) 08/02/2023   CO2 24 08/02/2023   INR 2.5 (H) 04/20/2023   HGBA1C 5.2 01/19/2023   MICROALBUR 150 12/10/2016    DG CHEST PORT 1 VIEW Result Date: 08/02/2023 CLINICAL DATA:  43 year old male with history of acute respiratory failure with hypoxia. EXAM: PORTABLE CHEST 1 VIEW COMPARISON:  Chest x-ray 08/01/2023. FINDINGS: Right internal jugular PermCath with tip terminating at the superior cavoatrial junction. Vascular stent projecting over the left axillary region. Lung volumes are normal. No consolidative airspace disease. No pleural effusions. No pneumothorax. No pulmonary nodule or mass noted. Pulmonary vasculature and the cardiomediastinal silhouette are within normal limits. IMPRESSION: No radiographic evidence of acute cardiopulmonary disease. Electronically Signed   By: Trudie Reed M.D.   On: 08/02/2023 07:53   DG Chest Port 1 View Result Date: 08/01/2023 CLINICAL DATA:  Seizure. EXAM: PORTABLE CHEST 1 VIEW COMPARISON:  March 28, 2023 FINDINGS: There is stable right-sided venous catheter positioning. The cardiac silhouette is mildly enlarged and unchanged in size. Both lungs are clear. The visualized skeletal structures are unremarkable. IMPRESSION: No active disease. Electronically Signed   By: Aram Candela M.D.   On: 08/01/2023 18:05   CT Head Wo Contrast Result  Date: 08/01/2023 CLINICAL DATA:  Head trauma, moderate-severe fall, on eliquis, low platelets EXAM: CT HEAD WITHOUT CONTRAST TECHNIQUE: Contiguous axial images were obtained from the base of the skull through the vertex without intravenous contrast. RADIATION DOSE REDUCTION: This exam was performed according to the departmental dose-optimization program which includes automated exposure control, adjustment of the mA and/or kV according to patient size and/or use of iterative reconstruction technique. COMPARISON:  Brain MR 03/30/2023 FINDINGS: Brain: No hemorrhage. No hydrocephalus. No extra-axial fluid collection. No mass effect. No mass lesion. No CT evidence of an acute cortical infarct. 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 There is also a chronic infarct in the left insular region. Generalized volume loss. Vascular: No hyperdense vessel or unexpected calcification. Skull: Lung along the posterior scalp. No evidence of an underlying calvarial fracture. Sinuses/Orbits: No middle ear or mastoid effusion. Paranasal sinuses are clear. Orbits are unremarkable. Other: None. IMPRESSION: 1. No CT evidence of intracranial injury. 2. Posterior scalp hematoma without evidence of an underlying calvarial fracture. Electronically Signed   By: Lorenza Cambridge M.D.   On: 08/01/2023 18:01     The total time spent in the appointment was 55 minutes encounter with patients including review of chart and various tests results, discussions about plan of care and coordination of care plan   All questions were answered. The patient knows to call the clinic with any problems, questions or concerns. No barriers to learning was detected.  Thank you for the courtesy of this consultation, Dawson Bills, NP  2/14/20252:49 PM

## 2023-08-02 NOTE — Progress Notes (Signed)
Progress Note   Patient: [name] [name] QMV:[id number] DOB: 10-Jan-1981 DOA: 08/01/2023     0 DOS: the patient was seen and examined on 08/02/2023   Brief hospital course: LESTON SCHUELLER is a 43 y.o. male with medical history significant for hypertension, lupus, history of CVAs, positive lupus anticoagulant on Eliquis, seizure disorder, and history of nonadherence with his medications who presents after a suspected seizure during HD yesterday. Platelets found to be 23k, started on steroids after talking to oncology. He was loaded with IV keppra 1gm and admitted for further management.  Assessment and Plan: Seizure disorder Non compliance S/p IV Keppra 1gm loading dose. Continue Keppra home dose  Acute hypoxia-  Continue supplemental o2 as needed.  Thrombocytopenia Platelets 35k. Will continue decadron for 3 more days. Oncology evaluation appreciated, he will need follow up as outpatient.  ESRD on HD: HD scheduled for today. Nephrology called for HD.  Hypertension: Continue Coreg.  Lupus anticoagulant Will resume Eliquis upon discharge.     Out of bed to chair. Incentive spirometry. Nursing supportive care. Fall, aspiration precautions. DVT prophylaxis   Code Status: Full Code  Subjective: Patient is seen and examined today morning. He is feeling better. Wishes to go home. No more seizure episodes.   Physical Exam: Vitals:   08/01/23 2300 08/02/23 0350 08/02/23 0811 08/02/23 1106  BP: (!) 146/99 (!) 135/92 123/72 129/85  Pulse: 77 (!) 103 85 73  Resp: 17 17 20 18   Temp: 98.1 F (36.7 C) 98 F (36.7 C) 98.4 F (36.9 C) 98.2 F (36.8 C)  TempSrc: Oral Oral Oral Oral  SpO2: 96% 100% 100% 96%  Weight:      Height:        General - Young  African [place] male, no apparent distress HEENT - PERRLA, EOMI, atraumatic head, non tender sinuses. Lung - Clear, basal rales, no rhonchi, wheezes. Heart - S1, S2 heard, no murmurs, rubs, no pedal edema. Abdomen - Soft, non  tender, bowel sounds good Neuro - Alert, awake and oriented x 3, non focal exam. Skin - Warm and dry.  Data Reviewed:      Latest Ref Rng & Units 08/02/2023   10:31 AM 08/02/2023    7:48 AM 08/01/2023    4:30 PM  CBC  WBC 4.0 - 10.5 K/uL 9.1  8.2  7.9   Hemoglobin 13.0 - 17.0 g/dL 28.4  13.2  44.0   Hematocrit 39.0 - 52.0 % 37.1  37.1  43.2   Platelets 150 - 400 K/uL 35  35  23    25       Latest Ref Rng & Units 08/02/2023    7:48 AM 08/01/2023   11:27 AM 05/14/2023    6:23 PM  BMP  Glucose 70 - 99 mg/dL 102  98  725   BUN 6 - 20 mg/dL 36  25  15   Creatinine 0.61 - 1.24 mg/dL 36.64  4.03  4.74   Sodium 135 - 145 mmol/L 135  138  138   Potassium 3.5 - 5.1 mmol/L 3.8  3.5  3.9   Chloride 98 - 111 mmol/L 96  96  97   CO2 22 - 32 mmol/L 24  30  31    Calcium 8.9 - 10.3 mg/dL 9.5  9.2  8.5    DG CHEST PORT 1 VIEW Result Date: 08/02/2023 CLINICAL DATA:  43 year old male with history of acute respiratory failure with hypoxia. EXAM: PORTABLE CHEST 1 VIEW COMPARISON:  Chest x-ray  08/01/2023. FINDINGS: Right internal jugular PermCath with tip terminating at the superior cavoatrial junction. Vascular stent projecting over the left axillary region. Lung volumes are normal. No consolidative airspace disease. No pleural effusions. No pneumothorax. No pulmonary nodule or mass noted. Pulmonary vasculature and the cardiomediastinal silhouette are within normal limits. IMPRESSION: No radiographic evidence of acute cardiopulmonary disease. Electronically Signed   By: Trudie Reed M.D.   On: 08/02/2023 07:53   DG Chest Port 1 View Result Date: 08/01/2023 CLINICAL DATA:  Seizure. EXAM: PORTABLE CHEST 1 VIEW COMPARISON:  March 28, 2023 FINDINGS: There is stable right-sided venous catheter positioning. The cardiac silhouette is mildly enlarged and unchanged in size. Both lungs are clear. The visualized skeletal structures are unremarkable. IMPRESSION: No active disease. Electronically Signed   By:  Aram Candela M.D.   On: 08/01/2023 18:05   CT Head Wo Contrast Result Date: 08/01/2023 CLINICAL DATA:  Head trauma, moderate-severe fall, on eliquis, low platelets EXAM: CT HEAD WITHOUT CONTRAST TECHNIQUE: Contiguous axial images were obtained from the base of the skull through the vertex without intravenous contrast. RADIATION DOSE REDUCTION: This exam was performed according to the departmental dose-optimization program which includes automated exposure control, adjustment of the mA and/or kV according to patient size and/or use of iterative reconstruction technique. COMPARISON:  Brain MR 03/30/2023 FINDINGS: Brain: No hemorrhage. No hydrocephalus. No extra-axial fluid collection. No mass effect. No mass lesion. No CT evidence of an acute cortical infarct. 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 There is also a chronic infarct in the left insular region. Generalized volume loss. Vascular: No hyperdense vessel or unexpected calcification. Skull: Lung along the posterior scalp. No evidence of an underlying calvarial fracture. Sinuses/Orbits: No middle ear or mastoid effusion. Paranasal sinuses are clear. Orbits are unremarkable. Other: None. IMPRESSION: 1. No CT evidence of intracranial injury. 2. Posterior scalp hematoma without evidence of an underlying calvarial fracture. Electronically Signed   By: Lorenza Cambridge M.D.   On: 08/01/2023 18:01   Family Communication: Discussed with patient, he understand and agree. All questions answereed.  Disposition: Status is: Observation The patient remains OBS appropriate and will d/c before 2 midnights.  Planned Discharge Destination: Home     Time spent: 38 minutes  Author: Marcelino Duster, MD 08/02/2023 12:12 PM Secure chat 7am to 7pm For on call review [email or web address].

## 2023-08-02 NOTE — TOC Initial Note (Signed)
Transition of Care [facility]) - Initial/Assessment Note    Patient Details  Name: [name] [name] MRN: [id number] Date of Birth: Sep 09, 1980  [facility] CM/SW Contact:    Kermit Balo, RN Phone Number: 08/02/2023, 3:43 PM  Clinical Narrative:                  Pt is from home with roommates. He says someone is usually with him.  Pt has HD MWF he uses medicaid transportation for appts and HD.  He manages his own medications and denies any issues. Pt states his roommate may be able to transport home at d/c. If not he will need assistance. TOC following.  Expected Discharge Plan: Home/Self Care Barriers to Discharge: Continued Medical Work up   Patient Goals and CMS Choice            Expected Discharge Plan and Services       Living arrangements for the past 2 months: Single Family Home                                      Prior Living Arrangements/Services Living arrangements for the past 2 months: Single Family Home Lives with:: Roommate Patient language and need for interpreter reviewed:: Yes Do you feel safe going back to the place where you live?: Yes            Criminal Activity/Legal Involvement Pertinent to Current Situation/Hospitalization: No - Comment as needed  Activities of Daily Living   ADL Screening (condition at time of admission) Independently performs ADLs?: Yes (appropriate for developmental age) Is the patient deaf or have difficulty hearing?: No Does the patient have difficulty seeing, even when wearing glasses/contacts?: No Does the patient have difficulty concentrating, remembering, or making decisions?: No  Permission Sought/Granted                  Emotional Assessment Appearance:: Appears stated age Attitude/Demeanor/Rapport: Engaged Affect (typically observed): Accepting Orientation: : Oriented to Self, Oriented to Place, Oriented to  Time, Oriented to Situation   Psych Involvement: No  (comment)  Admission diagnosis:  Seizure (HCC) [R56.9] Seizures (HCC) [R56.9] Thrombocytopenia (HCC) [D69.6] Hypoxia [R09.02] Patient Active Problem List   Diagnosis Date Noted   History of stroke 08/01/2023   Cerebrovascular accident (CVA) due to embolism of cerebral artery (HCC) 04/04/2023   Seizure (HCC) 01/19/2023   Acute respiratory failure with hypoxia (HCC) 01/19/2023   Status epilepticus (HCC) 08/10/2022   ESRD on hemodialysis (HCC) 02/15/2022   Other disorders of phosphorus metabolism 08/18/2021   Fluid overload, unspecified 05/10/2021   Allergy, unspecified, initial encounter 04/19/2021   Anaphylactic shock, unspecified, sequela 04/19/2021   Pruritus, unspecified 04/19/2021   Hypocalcemia 04/01/2021   Long-term current use of anticonvulsant 08/04/2020   COVID-19 07/26/2020   Breakthrough seizure (HCC) 07/20/2020   Acute respiratory failure with hypoxia (HCC)    Lupus    Seizure, late effect of stroke (HCC) 08/26/2019   Secondary hyperparathyroidism of renal origin (HCC) 11/05/2018   Anemia in chronic kidney disease 09/23/2018   ESRD (end stage renal disease) (HCC) 03/18/2018   Prediabetes 01/10/2018   Chest pain in adult    CKD (chronic kidney disease) stage 3, GFR 30-59 ml/min (HCC) 10/24/2015   Seizures (HCC) 10/24/2015   HTN (hypertension) 01/12/2015   Chronic anticoagulation 04/24/2011   Lupus anticoagulant positive 04/24/2011   OBESITY, NOS 08/15/2006   Thrombocytopenia (  HCC) 08/15/2006   TOBACCO DEPENDENCE 08/15/2006   CVA 08/15/2006   PCP:  Claiborne Rigg, NP Pharmacy:   [facility] [phone] Ginette Otto, [place] - [address] AT [facility] OF [address] [place] [address] Phone: [phone] Fax: [phone]  [facility] [address] [place] [address] Phone: [phone] Fax: [phone]  [facility] - [address] [place] - [address] [place] [address] Phone: [phone] Fax: [phone]  [facility] - [place], [place] - [address] [place] [address] Phone: [phone] Fax: [phone]     Social Drivers of Health (SDOH) Social History: SDOH Screenings   Food Insecurity: No Food Insecurity (08/01/2023)  Housing: High Risk (08/01/2023)  Transportation Needs: No Transportation Needs (08/01/2023)  Utilities: Not At Risk (08/01/2023)  Alcohol Screen: Low Risk  (03/26/2023)  Depression (PHQ2-9): Low Risk  (03/26/2023)  Financial Resource Strain: Low Risk  (03/26/2023)  Physical Activity: Inactive (03/26/2023)  Social Connections: Moderately Isolated (03/26/2023)  Stress: No Stress Concern Present (03/26/2023)  Tobacco Use: High Risk (08/02/2023)  Health Literacy: Adequate Health Literacy (03/26/2023)   SDOH Interventions:     Readmission Risk Interventions    03/28/2023    2:57 PM 01/25/2023    3:40 PM  Readmission Risk Prevention Plan  Post Dischage Appt    Medication Screening    Transportation Screening Complete   Medication Review (RN Care Manager) Referral to Pharmacy   PCP or Specialist appointment within 3-5 days of discharge Complete   HRI or Home Care Consult Complete   SW Recovery Care/Counseling Consult Complete   Palliative Care Screening Not Applicable   Skilled Nursing Facility Not Applicable      Information is confidential and restricted. Go to Review Flowsheets to unlock data.

## 2023-08-02 NOTE — Telephone Encounter (Signed)
Scheduled appointments per referral. Patient is aware of the appointment time and date as well as the address. Patient was informed to arrive 10-15 minutes prior with updated insurance information. All questions were answered.

## 2023-08-02 NOTE — Plan of Care (Signed)
  Problem: Education: Goal: Knowledge of General Education information will improve Description: Including pain rating scale, medication(s)/side effects and non-pharmacologic comfort measures Outcome: Progressing   Problem: Clinical Measurements: Goal: Ability to maintain clinical measurements within normal limits will improve Outcome: Progressing   Problem: Safety: Goal: Ability to remain free from injury will improve Outcome: Progressing   Problem: Education: Goal: Expressions of having a comfortable level of knowledge regarding the disease process will increase Outcome: Progressing   Problem: Health Behavior/Discharge Planning: Goal: Compliance with prescribed medication regimen will improve Outcome: Progressing   Problem: Safety: Goal: Verbalization of understanding the information provided will improve Outcome: Progressing   Problem: Self-Concept: Goal: Level of anxiety will decrease Outcome: Progressing

## 2023-08-03 ENCOUNTER — Other Ambulatory Visit (HOSPITAL_COMMUNITY): Payer: Self-pay

## 2023-08-03 ENCOUNTER — Encounter (HOSPITAL_COMMUNITY): Payer: Self-pay

## 2023-08-03 DIAGNOSIS — I10 Essential (primary) hypertension: Secondary | ICD-10-CM | POA: Diagnosis not present

## 2023-08-03 DIAGNOSIS — R569 Unspecified convulsions: Secondary | ICD-10-CM | POA: Diagnosis not present

## 2023-08-03 DIAGNOSIS — Z91148 Patient's other noncompliance with medication regimen for other reason: Secondary | ICD-10-CM

## 2023-08-03 DIAGNOSIS — Z8673 Personal history of transient ischemic attack (TIA), and cerebral infarction without residual deficits: Secondary | ICD-10-CM | POA: Diagnosis not present

## 2023-08-03 DIAGNOSIS — N186 End stage renal disease: Secondary | ICD-10-CM | POA: Diagnosis not present

## 2023-08-03 LAB — BASIC METABOLIC PANEL
Anion gap: 17 — ABNORMAL HIGH (ref 5–15)
BUN: 64 mg/dL — ABNORMAL HIGH (ref 6–20)
CO2: 21 mmol/L — ABNORMAL LOW (ref 22–32)
Calcium: 10.1 mg/dL (ref 8.9–10.3)
Chloride: 95 mmol/L — ABNORMAL LOW (ref 98–111)
Creatinine, Ser: 14.87 mg/dL — ABNORMAL HIGH (ref 0.61–1.24)
GFR, Estimated: 4 mL/min — ABNORMAL LOW (ref >60)
Glucose, Bld: 127 mg/dL — ABNORMAL HIGH (ref 70–99)
Potassium: 4.8 mmol/L (ref 3.5–5.1)
Sodium: 133 mmol/L — ABNORMAL LOW (ref 135–145)

## 2023-08-03 LAB — CBC
HCT: 38.6 % — ABNORMAL LOW (ref 39.0–52.0)
Hemoglobin: 12.6 g/dL — ABNORMAL LOW (ref 13.0–17.0)
MCH: 30.6 pg (ref 26.0–34.0)
MCHC: 32.6 g/dL (ref 30.0–36.0)
MCV: 93.7 fL (ref 80.0–100.0)
Platelets: 52 10*3/uL — ABNORMAL LOW (ref 150–400)
RBC: 4.12 MIL/uL — ABNORMAL LOW (ref 4.22–5.81)
RDW: 16.1 % — ABNORMAL HIGH (ref 11.5–15.5)
WBC: 10.2 10*3/uL (ref 4.0–10.5)
nRBC: 0 % (ref 0.0–0.2)

## 2023-08-03 LAB — HEPATITIS B SURFACE ANTIBODY, QUANTITATIVE: Hep B S AB Quant (Post): 5328 m[IU]/mL

## 2023-08-03 MED ORDER — HEPARIN SODIUM (PORCINE) 1000 UNIT/ML IJ SOLN
INTRAMUSCULAR | Status: AC
  Filled 2023-08-03: qty 4

## 2023-08-03 MED ORDER — APIXABAN 5 MG PO TABS
5.0000 mg | ORAL_TABLET | Freq: Two times a day (BID) | ORAL | 0 refills | Status: DC
  Filled 2023-08-03: qty 60, 30d supply, fill #0

## 2023-08-03 MED ORDER — HEPARIN SODIUM (PORCINE) 1000 UNIT/ML DIALYSIS
1000.0000 [IU] | INTRAMUSCULAR | Status: DC | PRN
  Administered 2023-08-03: 3200 [IU]

## 2023-08-03 MED ORDER — RENA-VITE PO TABS
1.0000 | ORAL_TABLET | Freq: Every day | ORAL | 0 refills | Status: DC
Start: 2023-08-03 — End: 2023-10-21
  Filled 2023-08-03: qty 100, 100d supply, fill #0

## 2023-08-03 MED ORDER — DOCUSATE SODIUM 100 MG PO CAPS
100.0000 mg | ORAL_CAPSULE | Freq: Two times a day (BID) | ORAL | 0 refills | Status: DC | PRN
  Filled 2023-08-03: qty 30, 15d supply, fill #0

## 2023-08-03 MED ORDER — ATORVASTATIN CALCIUM 40 MG PO TABS
40.0000 mg | ORAL_TABLET | Freq: Every day | ORAL | 0 refills | Status: AC
  Filled 2023-08-03: qty 90, 90d supply, fill #0

## 2023-08-03 MED ORDER — DEXAMETHASONE 4 MG PO TABS
40.0000 mg | ORAL_TABLET | Freq: Two times a day (BID) | ORAL | 0 refills | Status: AC
  Filled 2023-08-03: qty 60, 3d supply, fill #0

## 2023-08-03 MED ORDER — LEVETIRACETAM 500 MG PO TABS
500.0000 mg | ORAL_TABLET | Freq: Two times a day (BID) | ORAL | 0 refills | Status: DC
  Filled 2023-08-03: qty 180, 90d supply, fill #0

## 2023-08-03 MED ORDER — CARVEDILOL 6.25 MG PO TABS
6.2500 mg | ORAL_TABLET | Freq: Two times a day (BID) | ORAL | 0 refills | Status: DC
  Filled 2023-08-03: qty 180, 90d supply, fill #0

## 2023-08-03 NOTE — Progress Notes (Signed)
Patient returned to room from HD

## 2023-08-03 NOTE — Progress Notes (Signed)
AVS reviewed with patient. Patient verbalize understanding of his medications, follow up appointments and other instructions contain in the AVS. A copy of AVS given to patient to take home. Awaiting on his transportation at this time.

## 2023-08-03 NOTE — Plan of Care (Signed)

## 2023-08-03 NOTE — Progress Notes (Signed)
   08/03/23 1159  Vitals  Pulse Rate 82  BP 135/88  SpO2 96 %  O2 Device Room Air  Weight 119.2 kg  Type of Weight Post-Dialysis  Oxygen Therapy  Patient Activity (if Appropriate) In bed  Post Treatment  Dialyzer Clearance Clear  Liters Processed 54.3  Fluid Removed (mL) 2.8 mL  Tolerated HD Treatment Yes   Received patient in bed to unit.  Alert and oriented.  Informed consent signed and in chart.   TX duration:3.0 per Dr. Arlean Hopping  Patient tolerated well.  Transported back to the room  Alert, without acute distress.  Hand-off given to patient's nurse.   Access used: L CVC Access issues: Positinal CVC  Total UF removed: Medication(s) given: See MAR    Laqueta Due, RN [facility]

## 2023-08-03 NOTE — Discharge Planning (Signed)
[facility] Patient Discharge Orders- [facility] CLINIC: [facility]  Patient's name: AXZEL ROCKHILL Admit/DC Dates: 08/01/2023 - 08/03/2023  Discharge Diagnoses: Suspected seizure after HD - per pharmacy profile, ran out of seizure medications 2 months ago. S/p IV Keppra home dose. Rx sent to [facility] pharmacy   Thrombocytopenia - Platelets improved to 52K. Has f/u with Oncology on 2/20. Continue Decadron X 3 more days  Aranesp: Given: No    Last Hgb: 12.6 PRBC's Given: No  ESA dose for discharge: Hold Micera for now. Last Hgb here was 12.6. IV Iron dose at discharge: Continue weekly Fe  Heparin change: No  EDW Change: No.   Bath Change: No  Access intervention/Change: No  Calcitriol change: No  Discharge Labs: Calcium 10.1 K+ 4.8  IV Antibiotics: No  On Coumadin?: No. On Eliquis   OTHER/APPTS/LAB ORDERS:    D/C Meds to be reconciled by nurse after every discharge.  Completed By: Salome Holmes, NP   Reviewed by: MD:______ RN_______

## 2023-08-03 NOTE — Progress Notes (Signed)
[facility] Progress Note   Subjective:    Seen and examined patient on HD. Tolerating UFG 2.5L. BP is 129/91. He denies SOB, CP, and N/v. Noted Oncology following for thrombocytopenia.  Objective Vitals:   08/03/23 0831 08/03/23 0900 08/03/23 1000 08/03/23 1030  BP: (!) 154/110 (!) 141/101 124/78 112/80  Pulse: 74 70 75 89  Resp: 18 13 10 15   Temp:      TempSrc:      SpO2: 99% 95% 95% 95%  Weight:      Height:       Physical Exam General: Awake, alert, NAD Heart: S1 and S2; No MRGs Lungs: Clear anteriorly. Breathing unlabored Abdomen: Soft and non-tender Extremities: No LE edema Dialysis Access: [facility]   Filed Weights   08/01/23 1625 08/01/23 2300 08/03/23 0820  Weight: 104.3 kg 115.2 kg 121 kg    Intake/Output Summary (Last 24 hours) at 08/03/2023 1045 Last data filed at 08/03/2023 0800 Gross per 24 hour  Intake 563 ml  Output --  Net 563 ml    Additional Objective Labs: Basic Metabolic Panel: Recent Labs  Lab 08/01/23 1127 08/02/23 0748 08/03/23 0624  NA 138 135 133*  K 3.5 3.8 4.8  CL 96* 96* 95*  CO2 30 24 21*  GLUCOSE 98 156* 127*  BUN 25* 36* 64*  CREATININE 8.63* 11.73* 14.87*  CALCIUM 9.2 9.5 10.1   Liver Function Tests: No results for input(s): "AST", "ALT", "ALKPHOS", "BILITOT", "PROT", "ALBUMIN" in the last 168 hours. No results for input(s): "LIPASE", "AMYLASE" in the last 168 hours. CBC: Recent Labs  Lab 08/01/23 1127 08/01/23 1630 08/02/23 0748 08/02/23 0800 08/02/23 1031 08/03/23 0624  WBC 7.8 7.9 8.2 8.5 9.1 10.2  NEUTROABS 5.6  --   --  6.7 6.3  --   HGB 12.5* 13.3 12.1* 12.3* 12.2* 12.6*  HCT 39.3 43.2 37.1* 37.4* 37.1* 38.6*  MCV 97.5 100.0 94.4 94.4 94.4 93.7  PLT 20* 25*  23* 35* 35* 35* 52*   Blood Culture    Component Value Date/Time   SDES BLOOD RIGHT HAND 01/19/2023 0213   SPECREQUEST  01/19/2023 1610    BOTTLES DRAWN AEROBIC AND ANAEROBIC Blood Culture results may not be optimal due to an  excessive volume of blood received in culture bottles   CULT  01/19/2023 0213    NO GROWTH 5 DAYS Performed at [facility], [address] [place], [place] [address]    REPTSTATUS 01/24/2023 FINAL 01/19/2023 4098    Cardiac Enzymes: No results for input(s): "CKTOTAL", "CKMB", "CKMBINDEX", "TROPONINI" in the last 168 hours. CBG: Recent Labs  Lab 08/01/23 1143 08/01/23 1624  GLUCAP 107* 86   Iron Studies: No results for input(s): "IRON", "TIBC", "TRANSFERRIN", "FERRITIN" in the last 72 hours. Lab Results  Component Value Date   INR 2.5 (H) 04/20/2023   INR 2.6 (H) 04/19/2023   INR 2.7 (H) 04/18/2023   PROTIME 28.8 (H) 11/04/2013   PROTIME 21.6 (H) 10/14/2013   PROTIME 18.0 (H) 09/23/2013   Studies/Results: DG CHEST PORT 1 VIEW Result Date: 08/02/2023 CLINICAL DATA:  43 year old male with history of acute respiratory failure with hypoxia. EXAM: PORTABLE CHEST 1 VIEW COMPARISON:  Chest x-ray 08/01/2023. FINDINGS: Right internal jugular PermCath with tip terminating at the superior cavoatrial junction. Vascular stent projecting over the left axillary region. Lung volumes are normal. No consolidative airspace disease. No pleural effusions. No pneumothorax. No pulmonary nodule or mass noted. Pulmonary vasculature and the cardiomediastinal silhouette are within  normal limits. IMPRESSION: No radiographic evidence of acute cardiopulmonary disease. Electronically Signed   By: Trudie Reed M.D.   On: 08/02/2023 07:53   DG Chest Port 1 View Result Date: 08/01/2023 CLINICAL DATA:  Seizure. EXAM: PORTABLE CHEST 1 VIEW COMPARISON:  March 28, 2023 FINDINGS: There is stable right-sided venous catheter positioning. The cardiac silhouette is mildly enlarged and unchanged in size. Both lungs are clear. The visualized skeletal structures are unremarkable. IMPRESSION: No active disease. Electronically Signed   By: Aram Candela M.D.   On: 08/01/2023 18:05   CT Head Wo Contrast Result  Date: 08/01/2023 CLINICAL DATA:  Head trauma, moderate-severe fall, on eliquis, low platelets EXAM: CT HEAD WITHOUT CONTRAST TECHNIQUE: Contiguous axial images were obtained from the base of the skull through the vertex without intravenous contrast. RADIATION DOSE REDUCTION: This exam was performed according to the departmental dose-optimization program which includes automated exposure control, adjustment of the mA and/or kV according to patient size and/or use of iterative reconstruction technique. COMPARISON:  Brain MR 03/30/2023 FINDINGS: Brain: No hemorrhage. No hydrocephalus. No extra-axial fluid collection. No mass effect. No mass lesion. No CT evidence of an acute cortical infarct. 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 There is also a chronic infarct in the left insular region. Generalized volume loss. Vascular: No hyperdense vessel or unexpected calcification. Skull: Lung along the posterior scalp. No evidence of an underlying calvarial fracture. Sinuses/Orbits: No middle ear or mastoid effusion. Paranasal sinuses are clear. Orbits are unremarkable. Other: None. IMPRESSION: 1. No CT evidence of intracranial injury. 2. Posterior scalp hematoma without evidence of an underlying calvarial fracture. Electronically Signed   By: Lorenza Cambridge M.D.   On: 08/01/2023 18:01    Medications:   atorvastatin  40 mg Oral Daily   carvedilol  6.25 mg Oral BID WC   dexamethasone  40 mg Oral Q24H   levETIRAcetam  500 mg Oral BID   mupirocin ointment  1 Application Nasal BID   sodium chloride flush  3 mL Intravenous Q12H    Dialysis Orders: [facility] MWF 4h  B400  107.5kg  2K bath  RIJ TDC Hep none  Renal-related home meds: - renavite - coreg 6.25 bid - others: keppra, lipitor, colace  Assessment/Plan: Seizures - breakthrough seizure in this pt w/ hx of seizure d/o. Got IV keppra load and continues on po keppra maint dose.   H/o APLA syndrome - has struggled w/ medication adherence,  on eliquis now. Holding until plts improve.  Thrombocytopenia - plts are now 52K, no bleeding. Oncology now following: per note, concern for ITP vs. Viral illness driven myelosuppression. On Decadron X 4 days. Transfuse PRN to keep PLTs > 10,000. Patient has an outpatient f/u on 08/08/23. ESRD - on HD MWF. On HD and will place him back on MWF schedule next week. HTN - takes only low dose coreg for BP lowering meds.  Volume - up 7 kg by wts. Get standing wt pre HD. Doesn't look real vol overloaded on exam.  Anemia of esrd - Hb 12- 13, no esa needs.  Secondary hyperparathyroidism - Ca in range. Cont binders w/ meals.  H/o CVA  Salome Holmes, NP [facility] 08/03/2023,10:45 AM  LOS: 0 days

## 2023-08-03 NOTE — Discharge Summary (Signed)
Physician Discharge Summary   Patient: [name] [name] MRN: [id number] DOB: 1980/07/13  Admit date:     08/01/2023  Discharge date: 08/03/23  Discharge Physician: Marcelino Duster   PCP: Claiborne Rigg, NP   Recommendations at discharge:   PCP follow up in 1 week. Hematology follow up 08/08/23 as scheduled with repeat CBC. HD as scheduled.  Discharge Diagnoses: Principal Problem:   Seizures (HCC) Active Problems:   Thrombocytopenia (HCC)   Lupus anticoagulant positive   HTN (hypertension)   ESRD on hemodialysis (HCC)   Acute respiratory failure with hypoxia (HCC)   History of stroke  Resolved Problems:   * No resolved hospital problems. [facility] Course: [name] [name] is a 43 y.o. male with medical history significant for hypertension, lupus, history of CVAs, positive lupus anticoagulant on Eliquis, seizure disorder, and history of nonadherence with his medications who presents after a suspected seizure during HD yesterday. Platelets found to be 23k, started on steroids after talking to oncology. He was loaded with IV keppra 1gm and admitted for further management. Patient seems to be not taking his meds as prescribed as he ran out more than 2 months ago per pharmacy profile.  Assessment and Plan: Seizure disorder Non compliance with medications. S/p IV Keppra 1gm loading dose. Continue Keppra home dose, new scripts sent to [facility] pharmacy.   Acute hypoxia-  No aspiration. Resolved. He is off supplemental o2.   Thrombocytopenia Platelets 23k upon presentation improved to 52k. Continue decadron for 3 more days. Oncology evaluation appreciated, advised to follow up on 08/08/23 as scheduled.   ESRD on HD: He got HD today, he is on MWF schedule. Advised to be compliant with HD sessions.   Hypertension: Continue Coreg.   Lupus anticoagulant Resumed Eliquis upon discharge.  Obesity Class II BMI 36.18. Diet, exercise and weight reduction advised.       Consultants: Hematology, Nephrology  Procedures performed: none  Disposition: Home Diet recommendation:  Discharge Diet Orders (From admission, onward)     Start     Ordered   08/03/23 0000  Diet - low sodium heart healthy        08/03/23 1120           Renal diet DISCHARGE MEDICATION: Allergies as of 08/03/2023       Reactions   Chlorhexidine Itching   Zyrtec [cetirizine] Swelling   FACIAL SWELLING, NOT CATEGORIZED   Cetirizine & Related Swelling        Medication List     TAKE these medications    acetaminophen 325 MG tablet Commonly known as: TYLENOL Take 2 tablets (650 mg total) by mouth every 6 (six) hours as needed for mild pain.   apixaban 5 MG Tabs tablet Commonly known as: Eliquis Take 1 tablet (5 mg total) by mouth 2 (two) times daily. What changed: See the new instructions.   atorvastatin 40 MG tablet Commonly known as: LIPITOR Take 1 tablet (40 mg total) by mouth daily.   carvedilol 6.25 MG tablet Commonly known as: COREG Take 1 tablet (6.25 mg total) by mouth 2 (two) times daily with a meal. What changed: See the new instructions.   dexAMETHasone 20 MG Tabs Take 40 mg by mouth 2 (two) times daily with a meal for 3 days. What changed: medication strength   docusate sodium 100 MG capsule Commonly known as: COLACE Take 1 capsule (100 mg total) by mouth 2 (two) times daily as needed for mild constipation.   levETIRAcetam 500 MG  tablet Commonly known as: KEPPRA Take 1 tablet (500 mg total) by mouth 2 (two) times daily. What changed: See the new instructions.   multivitamin Tabs tablet Take 1 tablet by mouth at bedtime.        Discharge Exam: Filed Weights   08/01/23 1625 08/01/23 2300 08/03/23 0820  Weight: 104.3 kg 115.2 kg 121 kg      08/03/2023   11:00 AM 08/03/2023   10:30 AM 08/03/2023   10:00 AM  Vitals with BMI  Systolic 128 112 130  Diastolic 88 80 78  Pulse 78 89 75   General - Young  [place] male, no  apparent distress HEENT - PERRLA, EOMI, atraumatic head, non tender sinuses. Lung - Clear, rales, rhonchi, wheezes. Heart - S1, S2 heard, no murmurs, rubs, trace pedal edema Neuro - Alert, awake and oriented x 3, non focal exam. Skin - Warm and dry.  Condition at discharge: stable  The results of significant diagnostics from this hospitalization (including imaging, microbiology, ancillary and laboratory) are listed below for reference.   Imaging Studies: DG CHEST PORT 1 VIEW Result Date: 08/02/2023 CLINICAL DATA:  43 year old male with history of acute respiratory failure with hypoxia. EXAM: PORTABLE CHEST 1 VIEW COMPARISON:  Chest x-ray 08/01/2023. FINDINGS: Right internal jugular PermCath with tip terminating at the superior cavoatrial junction. Vascular stent projecting over the left axillary region. Lung volumes are normal. No consolidative airspace disease. No pleural effusions. No pneumothorax. No pulmonary nodule or mass noted. Pulmonary vasculature and the cardiomediastinal silhouette are within normal limits. IMPRESSION: No radiographic evidence of acute cardiopulmonary disease. Electronically Signed   By: Trudie Reed M.D.   On: 08/02/2023 07:53   DG Chest Port 1 View Result Date: 08/01/2023 CLINICAL DATA:  Seizure. EXAM: PORTABLE CHEST 1 VIEW COMPARISON:  March 28, 2023 FINDINGS: There is stable right-sided venous catheter positioning. The cardiac silhouette is mildly enlarged and unchanged in size. Both lungs are clear. The visualized skeletal structures are unremarkable. IMPRESSION: No active disease. Electronically Signed   By: Aram Candela M.D.   On: 08/01/2023 18:05   CT Head Wo Contrast Result Date: 08/01/2023 CLINICAL DATA:  Head trauma, moderate-severe fall, on eliquis, low platelets EXAM: CT HEAD WITHOUT CONTRAST TECHNIQUE: Contiguous axial images were obtained from the base of the skull through the vertex without intravenous contrast. RADIATION DOSE REDUCTION: This  exam was performed according to the departmental dose-optimization program which includes automated exposure control, adjustment of the mA and/or kV according to patient size and/or use of iterative reconstruction technique. COMPARISON:  Brain MR 03/30/2023 FINDINGS: Brain: No hemorrhage. No hydrocephalus. No extra-axial fluid collection. No mass effect. No mass lesion. No CT evidence of an acute cortical infarct. 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 There is also a chronic infarct in the left insular region. Generalized volume loss. Vascular: No hyperdense vessel or unexpected calcification. Skull: Lung along the posterior scalp. No evidence of an underlying calvarial fracture. Sinuses/Orbits: No middle ear or mastoid effusion. Paranasal sinuses are clear. Orbits are unremarkable. Other: None. IMPRESSION: 1. No CT evidence of intracranial injury. 2. Posterior scalp hematoma without evidence of an underlying calvarial fracture. Electronically Signed   By: Lorenza Cambridge M.D.   On: 08/01/2023 18:01    Microbiology: Results for orders placed or performed during the hospital encounter of 08/01/23  MRSA Next Gen by PCR, Nasal     Status: Abnormal   Collection Time: 08/02/23 12:54 AM   Specimen: Nasal Mucosa; Nasal  Swab  Result Value Ref Range Status   MRSA by PCR Next Gen DETECTED (A) NOT DETECTED Final    Comment: RESULT CALLED TO, READ BACK BY AND VERIFIED WITH: L NACIVIBAD,RN@[id number]  08/02/23 MK (NOTE) The GeneXpert MRSA Assay (FDA approved for NASAL specimens only), is one component of a comprehensive MRSA colonization surveillance program. It is not intended to diagnose MRSA infection nor to guide or monitor treatment for MRSA infections. Test performance is not FDA approved in patients less than 45 years old. Performed at [facility], [address], [place], [place] [address]     Labs: CBC: Recent Labs  Lab 08/01/23 1127 08/01/23 1630 08/02/23 0748  08/02/23 0800 08/02/23 1031 08/03/23 0624  WBC 7.8 7.9 8.2 8.5 9.1 10.2  NEUTROABS 5.6  --   --  6.7 6.3  --   HGB 12.5* 13.3 12.1* 12.3* 12.2* 12.6*  HCT 39.3 43.2 37.1* 37.4* 37.1* 38.6*  MCV 97.5 100.0 94.4 94.4 94.4 93.7  PLT 20* 25*  23* 35* 35* 35* 52*   Basic Metabolic Panel: Recent Labs  Lab 08/01/23 1127 08/02/23 0748 08/03/23 0624  NA 138 135 133*  K 3.5 3.8 4.8  CL 96* 96* 95*  CO2 30 24 21*  GLUCOSE 98 156* 127*  BUN 25* 36* 64*  CREATININE 8.63* 11.73* 14.87*  CALCIUM 9.2 9.5 10.1   Liver Function Tests: No results for input(s): "AST", "ALT", "ALKPHOS", "BILITOT", "PROT", "ALBUMIN" in the last 168 hours. CBG: Recent Labs  Lab 08/01/23 1143 08/01/23 1624  GLUCAP 107* 86    Discharge time spent: 34 minutes.  Signed: Marcelino Duster, MD [facility] 08/03/2023

## 2023-08-04 LAB — LEVETIRACETAM LEVEL: Levetiracetam Lvl: 19.1 ug/mL (ref 10.0–40.0)

## 2023-08-05 ENCOUNTER — Telehealth: Payer: Self-pay

## 2023-08-05 NOTE — Transitions of Care (Post Inpatient/ED Visit) (Signed)
   08/05/2023  Name: [name] [name] MRN: [id number] DOB: 1981-03-03  Today's TOC FU Call Status: Today's TOC FU Call Status:: Unsuccessful Call (1st Attempt) Unsuccessful Call (1st Attempt) Date: 08/05/23  Attempted to reach the patient regarding the most recent Inpatient/ED visit.  Follow Up Plan: Additional outreach attempts will be made to reach the patient to complete the Transitions of Care (Post Inpatient/ED visit) call.   Signature  Robyne Peers, RN

## 2023-08-06 ENCOUNTER — Telehealth: Payer: Self-pay

## 2023-08-06 NOTE — Transitions of Care (Post Inpatient/ED Visit) (Signed)
   08/06/2023  Name: [name] [name] MRN: [id number] DOB: 01-Oct-1980  Today's TOC FU Call Status: Today's TOC FU Call Status:: Unsuccessful Call (2nd Attempt) Unsuccessful Call (1st Attempt) Date: 08/05/23 Unsuccessful Call (2nd Attempt) Date: 08/06/23  Attempted to reach the patient regarding the most recent Inpatient/ED visit.  Follow Up Plan: Additional outreach attempts will be made to reach the patient to complete the Transitions of Care (Post Inpatient/ED visit) call.   Signature Robyne Peers, RN

## 2023-08-07 ENCOUNTER — Other Ambulatory Visit: Payer: Self-pay | Admitting: Oncology

## 2023-08-07 ENCOUNTER — Telehealth: Payer: Self-pay | Admitting: Oncology

## 2023-08-07 ENCOUNTER — Telehealth: Payer: Self-pay

## 2023-08-07 DIAGNOSIS — D696 Thrombocytopenia, unspecified: Secondary | ICD-10-CM

## 2023-08-07 NOTE — Telephone Encounter (Signed)
Completed - Rescheduled patient's appointment per provider's request due to inclement weather.

## 2023-08-07 NOTE — Transitions of Care (Post Inpatient/ED Visit) (Signed)
   08/07/2023  Name: ZEIN HELBING MRN: [id number] DOB: [birth year]  Today's TOC FU Call Status: Today's TOC FU Call Status:: Unsuccessful Call (3rd Attempt) Unsuccessful Call (1st Attempt) Date: 08/05/23 Unsuccessful Call (2nd Attempt) Date: 08/06/23 Unsuccessful Call (3rd Attempt) Date: 08/07/23  Attempted to reach the patient regarding the most recent Inpatient/ED visit.  Follow Up Plan: No further outreach attempts will be made at this time. We have been unable to contact the patient.  Letter sent to patient requesting he contact [facility] to schedule a follow up appointment as we have not been able to reach him.   Signature  Robyne Peers, RN

## 2023-08-08 ENCOUNTER — Inpatient Hospital Stay: Payer: 59 | Admitting: Oncology

## 2023-08-08 ENCOUNTER — Inpatient Hospital Stay: Payer: 59

## 2023-08-22 ENCOUNTER — Inpatient Hospital Stay: Payer: 59

## 2023-08-22 ENCOUNTER — Telehealth: Payer: Self-pay | Admitting: Oncology

## 2023-08-22 ENCOUNTER — Inpatient Hospital Stay: Payer: 59 | Admitting: Oncology

## 2023-08-22 NOTE — Telephone Encounter (Signed)
 Patient stated that they called to cancel this morning but he could not get through the phone tree. [name] [name] rescheduled his appointments.

## 2023-08-28 ENCOUNTER — Other Ambulatory Visit: Payer: Self-pay

## 2023-08-28 DIAGNOSIS — N186 End stage renal disease: Secondary | ICD-10-CM

## 2023-08-29 ENCOUNTER — Telehealth: Payer: Self-pay

## 2023-08-29 ENCOUNTER — Inpatient Hospital Stay: Admitting: Oncology

## 2023-08-29 ENCOUNTER — Telehealth: Payer: Self-pay | Admitting: Oncology

## 2023-08-29 ENCOUNTER — Inpatient Hospital Stay

## 2023-08-29 NOTE — Telephone Encounter (Signed)
 Called patient to re schedule no show appointments. I was unable to leave a VM.

## 2023-08-29 NOTE — Telephone Encounter (Signed)
 I called this patient with not answer and VM is full. Also called the backup person with same result.

## 2023-08-29 NOTE — Telephone Encounter (Signed)
 Called the backup person with no answer and VM is full.

## 2023-09-03 ENCOUNTER — Encounter (HOSPITAL_COMMUNITY): Payer: Self-pay

## 2023-09-04 ENCOUNTER — Ambulatory Visit (HOSPITAL_COMMUNITY): Payer: 59 | Attending: Vascular Surgery

## 2023-09-04 ENCOUNTER — Ambulatory Visit: Payer: 59 | Admitting: Vascular Surgery

## 2023-09-04 ENCOUNTER — Ambulatory Visit (HOSPITAL_COMMUNITY): Payer: 59

## 2023-09-23 ENCOUNTER — Other Ambulatory Visit: Payer: Self-pay | Admitting: Nurse Practitioner

## 2023-09-23 NOTE — Telephone Encounter (Signed)
 Copied from CRM [id number]. Topic: Clinical - Medication Refill >> Sep 23, 2023  2:10 PM Truddie Crumble wrote: Most Recent Primary Care Visit:  Provider: Anthoney Harada  Department: [facility]  Visit Type: MEDICARE AWV, INITIAL  Date: 03/26/2023  Medication: levETIRAcetam (KEPPRA) 500 MG tablet  Has the patient contacted their pharmacy? Yes (Agent: If no, request that the patient contact the pharmacy for the refill. If patient does not wish to contact the pharmacy document the reason why and proceed with request.) (Agent: If yes, when and what did the pharmacy advise?)  Is this the correct pharmacy for this prescription? Yes If no, delete pharmacy and type the correct one.  This is the patient's preferred pharmacy:   [facility], [place] - [address] [place] [address] Phone: [phone] Fax: [phone]   Has the prescription been filled recently? No  Is the patient out of the medication? Yes  Has the patient been seen for an appointment in the last year OR does the patient have an upcoming appointment? Yes  Can we respond through MyChart? Yes  Agent: Please be advised that Rx refills may take up to 3 business days. We ask that you follow-up with your pharmacy.

## 2023-09-24 NOTE — Telephone Encounter (Signed)
 Patient is almost [place] his medication he has 4 more days he would like a call back regarding this

## 2023-09-24 NOTE — Telephone Encounter (Signed)
 Please give him the number for [facility]. He is supposed to be seeing them for his seizure medicine.   [facility] Address: [address], [place], [place] [address] Phone: [phone]

## 2023-09-24 NOTE — Telephone Encounter (Signed)
 Too soon for refill, OV needed for additional refills.  Requested Prescriptions  Pending Prescriptions Disp Refills   levETIRAcetam (KEPPRA) 500 MG tablet 180 tablet 0    Sig: Take 1 tablet (500 mg total) by mouth 2 (two) times daily.     Neurology:  Anticonvulsants - levetiracetam Failed - 09/24/2023  2:46 PM      Failed - Cr in normal range and within 360 days    Creat  Date Value Ref Range Status  01/24/2021 15.72 (H) 0.60 - 1.29 mg/dL Final    Comment:    Verified by repeat analysis. .    Creatinine, Ser  Date Value Ref Range Status  08/03/2023 14.87 (H) 0.61 - 1.24 mg/dL Final   Creatinine, POC  Date Value Ref Range Status  12/10/2016 200 mg/dL Final   Creatinine, Urine  Date Value Ref Range Status  12/21/2017 86.75 mg/dL Final    Comment:    Performed at [facility], [address] [place], [place] [address]  12/21/2017 87.57 mg/dL Final         Failed - Valid encounter within last 12 months    Recent Outpatient Visits           1 year ago Encounter for annual physical exam   [facility] [place]. [facility] Claiborne Rigg, NP   1 year ago Seizure, late effect of stroke [facility])   [facility] [place]. [facility] Claiborne Rigg, NP   1 year ago Chronic anticoagulation   [facility], [place]ella Schlein, [place]   2 years ago Primary hypertension   [facility] [place]. [facility] Claiborne Rigg, NP   4 years ago Essential hypertension   [facility] [place]. [facility] [place], [place], NP              Passed - Completed PHQ-2 or PHQ-9 in the last 360 days

## 2023-09-25 ENCOUNTER — Emergency Department (HOSPITAL_COMMUNITY)
Admission: EM | Admit: 2023-09-25 | Discharge: 2023-09-25 | Disposition: A | Discharge status: Home / Self Care | Attending: Emergency Medicine | Admitting: Emergency Medicine

## 2023-09-25 ENCOUNTER — Encounter (HOSPITAL_COMMUNITY): Payer: Self-pay

## 2023-09-25 DIAGNOSIS — G40909 Epilepsy, unspecified, not intractable, without status epilepticus: Secondary | ICD-10-CM

## 2023-09-25 DIAGNOSIS — R569 Unspecified convulsions: Secondary | ICD-10-CM | POA: Diagnosis present

## 2023-09-25 LAB — BASIC METABOLIC PANEL WITH GFR
Anion gap: 16 — ABNORMAL HIGH (ref 5–15)
BUN: 32 mg/dL — ABNORMAL HIGH (ref 6–20)
CO2: 31 mmol/L (ref 22–32)
Calcium: 8.9 mg/dL (ref 8.9–10.3)
Chloride: 91 mmol/L — ABNORMAL LOW (ref 98–111)
Creatinine, Ser: 13.59 mg/dL — ABNORMAL HIGH (ref 0.61–1.24)
GFR, Estimated: 4 mL/min — ABNORMAL LOW (ref >60)
Glucose, Bld: 87 mg/dL (ref 70–99)
Potassium: 4.1 mmol/L (ref 3.5–5.1)
Sodium: 138 mmol/L (ref 135–145)

## 2023-09-25 LAB — CBC WITH DIFFERENTIAL/PLATELET
Abs Immature Granulocytes: 0.03 10*3/uL (ref 0.00–0.07)
Basophils Absolute: 0 10*3/uL (ref 0.0–0.1)
Basophils Relative: 0 %
Eosinophils Absolute: 0.2 10*3/uL (ref 0.0–0.5)
Eosinophils Relative: 2 %
HCT: 44.6 % (ref 39.0–52.0)
Hemoglobin: 14.5 g/dL (ref 13.0–17.0)
Immature Granulocytes: 0 %
Lymphocytes Relative: 8 %
Lymphs Abs: 0.9 10*3/uL (ref 0.7–4.0)
MCH: 30.6 pg (ref 26.0–34.0)
MCHC: 32.5 g/dL (ref 30.0–36.0)
MCV: 94.1 fL (ref 80.0–100.0)
Monocytes Absolute: 1.1 10*3/uL — ABNORMAL HIGH (ref 0.1–1.0)
Monocytes Relative: 9 %
Neutro Abs: 9.4 10*3/uL — ABNORMAL HIGH (ref 1.7–7.7)
Neutrophils Relative %: 81 %
Platelets: 44 10*3/uL — ABNORMAL LOW (ref 150–400)
RBC: 4.74 MIL/uL (ref 4.22–5.81)
RDW: 14.4 % (ref 11.5–15.5)
WBC: 11.7 10*3/uL — ABNORMAL HIGH (ref 4.0–10.5)
nRBC: 0 % (ref 0.0–0.2)

## 2023-09-25 MED ORDER — LEVETIRACETAM 500 MG PO TABS
1000.0000 mg | ORAL_TABLET | Freq: Once | ORAL | Status: AC
  Administered 2023-09-25: 1000 mg via ORAL
  Filled 2023-09-25: qty 2

## 2023-09-25 MED ORDER — LEVETIRACETAM 500 MG PO TABS
1000.0000 mg | ORAL_TABLET | Freq: Two times a day (BID) | ORAL | 0 refills | Status: DC
  Filled 2023-09-25 – 2023-09-28 (×2): qty 120, 30d supply, fill #0

## 2023-09-25 NOTE — ED Notes (Addendum)
 Pt asked for meds to be sent to [facility] phamracy.Marland Kitchen they are now closed. MD notified to send to [facility].Marland Kitchen then he can be discharged. Pt given cab voucher.

## 2023-09-25 NOTE — ED Notes (Signed)
 ED Provider at bedside.

## 2023-09-25 NOTE — ED Provider Notes (Signed)
 [place] EMERGENCY DEPARTMENT AT [facility] Provider Note   CSN: [id number] Arrival date & time: 09/25/23  1554     History Chief Complaint  Patient presents with   Seizures    HPI [name] [name] is a 43 y.o. male presenting for chief complaint of seizure episode. Is a 43 year old male known epilepsy on Lamictal and Keppra.  [name] is compliant with medications. Was at dialysis today when he was noted to have a tonic-clonic seizure episode.  He had finished his full session. Denied any prodrome.  Back to baseline at this time. Is currently taking Lamictal and Keppra 500 mg twice daily. Otherwise ambulatory tolerating p.o. intake.  Denies fevers chills nausea vomiting syncope shortness of breath.  Denies any headache or other symptoms.  Follows with neurology in the outpatient setting.  Patient's recorded medical, surgical, social, medication list and allergies were reviewed in the Snapshot window as part of the initial history.   Review of Systems   Review of Systems  Constitutional:  Negative for chills and fever.  HENT:  Negative for ear pain and sore throat.   Eyes:  Negative for pain and visual disturbance.  Respiratory:  Negative for cough and shortness of breath.   Cardiovascular:  Negative for chest pain and palpitations.  Gastrointestinal:  Negative for abdominal pain and vomiting.  Genitourinary:  Negative for dysuria and hematuria.  Musculoskeletal:  Negative for arthralgias and back pain.  Skin:  Negative for color change and rash.  Neurological:  Positive for seizures. Negative for syncope.  All other systems reviewed and are negative.   Physical Exam Updated Vital Signs BP (!) 141/112   Pulse (!) 56   Temp 97.8 F (36.6 C) (Oral)   Resp 14   Ht 6' (1.829 m)   Wt 118.8 kg   SpO2 98%   BMI 35.53 kg/m  Physical Exam Vitals and nursing note reviewed.  Constitutional:      General: He is not in acute distress.    Appearance: He is  well-developed.  HENT:     Head: Normocephalic and atraumatic.  Eyes:     Conjunctiva/sclera: Conjunctivae normal.  Cardiovascular:     Rate and Rhythm: Normal rate and regular rhythm.     Heart sounds: No murmur heard. Pulmonary:     Effort: Pulmonary effort is normal. No respiratory distress.     Breath sounds: Normal breath sounds.  Abdominal:     Palpations: Abdomen is soft.     Tenderness: There is no abdominal tenderness.  Musculoskeletal:        General: No swelling.     Cervical back: Neck supple.  Skin:    General: Skin is warm and dry.     Capillary Refill: Capillary refill takes less than 2 seconds.  Neurological:     Mental Status: He is alert.  Psychiatric:        Mood and Affect: Mood normal.      ED Course/ Medical Decision Making/ A&P    Procedures Procedures   Medications Ordered in ED Medications  levETIRAcetam (KEPPRA) tablet 1,000 mg (1,000 mg Oral Given 09/25/23 1804)    Medical Decision Making:   [name] [name] is a 43 y.o. male who presented to the ED today with seizure episode detailed above.    Patient placed on continuous vitals and telemetry monitoring while in ED which was reviewed periodically.  Complete initial physical exam performed, notably the patient  was HDS in NAD.  Reviewed and confirmed nursing documentation for past medical history, family history, social history.    Initial Assessment:   With the patient's presentation of seizure, most likely diagnosis is breakthrough episode. Other diagnoses were considered including (but not limited to) treatment failure, electrolyte abnormality, critical anemia, cardiac pathology, CVA, intracranial hemorrhage. These are considered less likely due to history of present illness and physical exam findings.   This is most consistent with an acute life/limb threatening illness complicated by underlying chronic conditions. In particular, concerning intracranial hemorrhage, patient is without any  acute headache.  He has no focal neurologic deficits consistent with any CVA. Will continue to observe though if he has recurrence of seizure or further neurologic changes, will proceed with CT head though not indicated at this time  Initial Plan:  Screening labs including CBC and Metabolic panel to evaluate for infectious or metabolic etiology of disease.  EKG to evaluate for cardiac pathology Objective evaluation as below reviewed   Initial Study Results:   Laboratory  All laboratory results reviewed without evidence of clinically relevant pathology.    EKG EKG was reviewed independently. Rate, rhythm, axis, intervals all examined and without medically relevant abnormality. ST segments without concerns for elevations.     Reassessment and Plan:   Observed for 3 and half hours in the emergency room.  Remained at his mental status baseline.  Will increase Keppra to 1000 mg twice daily strong recommended compliance.  Patient was in agreement with outpatient plan strict turn precautions.  Educated on risk of drowning, MVA in the setting of seizures and prevention and patient expressed understanding.   Disposition:  I have considered need for hospitalization, however, considering all of the above, I believe this patient is stable for discharge at this time.  Patient/family educated about specific return precautions for given chief complaint and symptoms.  Patient/family educated about follow-up with PCP.     Patient/family expressed understanding of return precautions and need for follow-up. Patient spoken to regarding all imaging and laboratory results and appropriate follow up for these results. All education provided in verbal form with additional information in written form. Time was allowed for answering of patient questions. Patient discharged.    Emergency Department Medication Summary:   Medications  levETIRAcetam (KEPPRA) tablet 1,000 mg (1,000 mg Oral Given 09/25/23 1804)         Clinical Impression:  1. Seizures (HCC)   2. Seizure disorder [facility])      Discharge   Final Clinical Impression(s) / ED Diagnoses Final diagnoses:  Seizure disorder (HCC)  Seizures (HCC)    Rx / DC Orders ED Discharge Orders          Ordered    levETIRAcetam (KEPPRA) 500 MG tablet  2 times daily        09/25/23 1815    Ambulatory referral to Neurology  Status:  Canceled       Comments: An appointment is requested in approximately: 1 week   09/25/23 1815    Ambulatory referral to Neurology       Comments: An appointment is requested in approximately: 1 week   09/25/23 1817              Glyn Ade, MD 09/25/23 2323

## 2023-09-25 NOTE — Telephone Encounter (Signed)
 Patient identified by name and date of birth.  Patient aware of response and voiced understanding.  Contact information given for the neurologist.

## 2023-09-25 NOTE — ED Triage Notes (Addendum)
 Per EMS, Pt, from dialysis center, presents after having seizure like activity x 3.5-26mins.  Pt denies complaints.  Pt reports Hx of seizures and takes Lamictal and Keppra.  EMS reported last seizure x3 days ago.    Pt is currently A &Ox4.

## 2023-09-25 NOTE — ED Notes (Signed)
 Discharge instructions reviewed.   Newly prescribed medications discussed. Pharmacy verified.   Opportunity for questions and concerns provided.   Alert, oriented and escorted to waiting area via wheelchair. Cab voucher provided. Marland Kitchen   Displays no signs of distress.

## 2023-09-26 ENCOUNTER — Other Ambulatory Visit (HOSPITAL_COMMUNITY): Payer: Self-pay

## 2023-09-28 ENCOUNTER — Other Ambulatory Visit (HOSPITAL_COMMUNITY): Payer: Self-pay

## 2023-10-09 ENCOUNTER — Encounter (HOSPITAL_COMMUNITY): Payer: Self-pay

## 2023-10-09 NOTE — Progress Notes (Deleted)
 Patient ID: [name] [name], male   DOB: 01-Aug-1980, 43 y.o.   MRN: [id number]    After seen at ED 09/25/2023 HPI [name] [name] is a 43 y.o. male presenting for chief complaint of seizure episode. Is a 43 year old male known epilepsy on Lamictal  and Keppra .  [name] is compliant with medications. Was at dialysis today when he was noted to have a tonic-clonic seizure episode.  He had finished his full session. Denied any prodrome.  Back to baseline at this time. Is currently taking Lamictal  and Keppra  500 mg twice daily. Otherwise ambulatory tolerating p.o. intake.  Denies fevers chills nausea vomiting syncope shortness of breath.  Denies any headache or other symptoms.  Follows with neurology in the outpatient setting.  EKG EKG was reviewed independently. Rate, rhythm, axis, intervals all examined and without medically relevant abnormality. ST segments without concerns for elevations.     Reassessment and Plan:   Observed for 3 and half hours in the emergency room.  Remained at his mental status baseline.  Will increase Keppra  to 1000 mg twice daily strong recommended compliance.  Patient was in agreement with outpatient plan strict turn precautions.  Educated on risk of drowning, MVA in the setting of seizures and prevention and patient expressed understanding.   Disposition:  I have considered need for hospitalization, however, considering all of the above, I believe this patient is stable for discharge at this time.   Patient/family educated about specific return precautions for given chief complaint and symptoms.  Patient/family educated about follow-up with PCP.

## 2023-10-10 ENCOUNTER — Encounter (HOSPITAL_COMMUNITY): Payer: Self-pay

## 2023-10-10 ENCOUNTER — Ambulatory Visit: Admitting: Physician Assistant

## 2023-10-18 ENCOUNTER — Emergency Department (HOSPITAL_COMMUNITY)
Admission: EM | Admit: 2023-10-18 | Discharge: 2023-10-18 | Disposition: A | Discharge status: Home / Self Care | Source: Home / Self Care | Attending: Emergency Medicine | Admitting: Emergency Medicine

## 2023-10-18 ENCOUNTER — Other Ambulatory Visit: Payer: Self-pay

## 2023-10-18 DIAGNOSIS — Z79899 Other long term (current) drug therapy: Secondary | ICD-10-CM | POA: Insufficient documentation

## 2023-10-18 DIAGNOSIS — N186 End stage renal disease: Secondary | ICD-10-CM | POA: Insufficient documentation

## 2023-10-18 DIAGNOSIS — Z7901 Long term (current) use of anticoagulants: Secondary | ICD-10-CM | POA: Insufficient documentation

## 2023-10-18 DIAGNOSIS — R569 Unspecified convulsions: Secondary | ICD-10-CM | POA: Insufficient documentation

## 2023-10-18 DIAGNOSIS — G40209 Localization-related (focal) (partial) symptomatic epilepsy and epileptic syndromes with complex partial seizures, not intractable, without status epilepticus: Secondary | ICD-10-CM | POA: Diagnosis not present

## 2023-10-18 DIAGNOSIS — I120 Hypertensive chronic kidney disease with stage 5 chronic kidney disease or end stage renal disease: Secondary | ICD-10-CM | POA: Insufficient documentation

## 2023-10-18 DIAGNOSIS — Z992 Dependence on renal dialysis: Secondary | ICD-10-CM | POA: Insufficient documentation

## 2023-10-18 LAB — CBG MONITORING, ED: Glucose-Capillary: 75 mg/dL (ref 70–99)

## 2023-10-18 LAB — CBC WITH DIFFERENTIAL/PLATELET
Abs Immature Granulocytes: 0.03 10*3/uL (ref 0.00–0.07)
Basophils Absolute: 0 10*3/uL (ref 0.0–0.1)
Basophils Relative: 1 %
Eosinophils Absolute: 0.2 10*3/uL (ref 0.0–0.5)
Eosinophils Relative: 2 %
HCT: 32.4 % — ABNORMAL LOW (ref 39.0–52.0)
Hemoglobin: 10.6 g/dL — ABNORMAL LOW (ref 13.0–17.0)
Immature Granulocytes: 0 %
Lymphocytes Relative: 9 %
Lymphs Abs: 0.7 10*3/uL (ref 0.7–4.0)
MCH: 31 pg (ref 26.0–34.0)
MCHC: 32.7 g/dL (ref 30.0–36.0)
MCV: 94.7 fL (ref 80.0–100.0)
Monocytes Absolute: 0.9 10*3/uL (ref 0.1–1.0)
Monocytes Relative: 10 %
Neutro Abs: 6.7 10*3/uL (ref 1.7–7.7)
Neutrophils Relative %: 78 %
Platelets: 92 10*3/uL — ABNORMAL LOW (ref 150–400)
RBC: 3.42 MIL/uL — ABNORMAL LOW (ref 4.22–5.81)
RDW: 13.8 % (ref 11.5–15.5)
WBC: 8.5 10*3/uL (ref 4.0–10.5)
nRBC: 0 % (ref 0.0–0.2)

## 2023-10-18 LAB — ETHANOL: Alcohol, Ethyl (B): <15 mg/dL (ref <15)

## 2023-10-18 LAB — COMPREHENSIVE METABOLIC PANEL WITH GFR
ALT: 12 U/L (ref 0–44)
AST: 16 U/L (ref 15–41)
Albumin: 3.4 g/dL — ABNORMAL LOW (ref 3.5–5.0)
Alkaline Phosphatase: 59 U/L (ref 38–126)
Anion gap: 14 (ref 5–15)
BUN: 43 mg/dL — ABNORMAL HIGH (ref 6–20)
CO2: 23 mmol/L (ref 22–32)
Calcium: 8.7 mg/dL — ABNORMAL LOW (ref 8.9–10.3)
Chloride: 102 mmol/L (ref 98–111)
Creatinine, Ser: 16.43 mg/dL — ABNORMAL HIGH (ref 0.61–1.24)
GFR, Estimated: 3 mL/min — ABNORMAL LOW (ref >60)
Glucose, Bld: 90 mg/dL (ref 70–99)
Potassium: 4.4 mmol/L (ref 3.5–5.1)
Sodium: 139 mmol/L (ref 135–145)
Total Bilirubin: 0.7 mg/dL (ref 0.0–1.2)
Total Protein: 6.8 g/dL (ref 6.5–8.1)

## 2023-10-18 MED ORDER — LEVETIRACETAM IN NACL 1000 MG/100ML IV SOLN
1000.0000 mg | Freq: Two times a day (BID) | INTRAVENOUS | Status: DC
  Administered 2023-10-18: 1000 mg via INTRAVENOUS
  Filled 2023-10-18: qty 100

## 2023-10-18 NOTE — ED Notes (Signed)
 CCMD contacted

## 2023-10-18 NOTE — Discharge Instructions (Signed)
 As discussed, your evaluation today has been largely reassuring.  But, it is important that you monitor your condition carefully, and do not hesitate to return to the ED if you develop new, or concerning changes in your condition. ? ?Otherwise, please follow-up with your physician for appropriate ongoing care. ? ?

## 2023-10-18 NOTE — ED Provider Notes (Signed)
 [place] EMERGENCY DEPARTMENT AT [facility] Provider Note   CSN: [id number] Arrival date & time: 10/18/23  4098     History  Chief Complaint  Patient presents with   Seizures    Pt BIB EMS from home for witness seizure by family, full body seizure. Pt did have a fall, -head strike. Dialysis M/W/F. Right subclavian port. Pt A&Ox2 to person and place. PMH: ESRD, HTN, Seizures, Lupus.    [name] [name] is a 43 y.o. male.  HPI Patient arrives via EMS after witnessed seizure.  Patient has end-stage renal disease, seizure disorder, has been good with taking his medication.  Today, prior to going to dialysis he had a witnessed seizure per EMS.  EMS reports no hemodynamic instability, reported fall, full body seizure activity.  In transport patient was ANO x 2, on my arrival he denies pain, is ANO x 3, providing his own history, states that he was well, did go to dialysis 2 days ago as scheduled.    Home Medications Prior to Admission medications   Medication Sig Start Date End Date Taking? Authorizing Provider  acetaminophen  (TYLENOL ) 325 MG tablet Take 2 tablets (650 mg total) by mouth every 6 (six) hours as needed for mild pain. 02/13/23   Abbe Abate, MD  amLODipine  (NORVASC ) 10 MG tablet Take 10 mg by mouth at bedtime. 06/03/23   [provider]  apixaban  (ELIQUIS ) 2.5 MG TABS tablet Take 2.5 mg by mouth 2 (two) times daily.    [provider]  atorvastatin  (LIPITOR) 40 MG tablet Take 1 tablet (40 mg total) by mouth daily. 08/03/23   Aisha Hove, MD  AURYXIA  1 GM 210 MG(Fe) tablet Take 840 mg by mouth 3 (three) times daily. 10/07/23   [provider]  carvedilol  (COREG ) 6.25 MG tablet Take 1 tablet (6.25 mg total) by mouth 2 (two) times daily with a meal. 08/03/23   Aisha Hove, MD  docusate sodium  (COLACE) 100 MG capsule Take 1 capsule (100 mg total) by mouth 2 (two) times daily as needed for mild constipation. 08/03/23    Aisha Hove, MD  levETIRAcetam  (KEPPRA ) 500 MG tablet Take 2 tablets (1,000 mg total) by mouth 2 (two) times daily. 09/25/23 10/25/23  Countryman, Chase, MD  multivitamin (RENA-VIT) TABS tablet Take 1 tablet by mouth at bedtime. 08/03/23   Aisha Hove, MD  sevelamer  carbonate (RENVELA ) 800 MG tablet Take 800 mg by mouth 3 (three) times daily with meals.    [provider]      Allergies    Chlorhexidine  and Cetirizine & related    Review of Systems   Review of Systems  Physical Exam Updated Vital Signs BP (!) 179/114   Pulse 81   Temp 98.1 F (36.7 C) (Oral)   Resp 18   Ht 6' (1.829 m)   Wt 127 kg   SpO2 93%   BMI 37.97 kg/m  Physical Exam Vitals and nursing note reviewed.  Constitutional:      General: He is not in acute distress.    Appearance: He is well-developed.  HENT:     Head: Normocephalic and atraumatic.  Eyes:     Conjunctiva/sclera: Conjunctivae normal.  Cardiovascular:     Rate and Rhythm: Normal rate and regular rhythm.  Pulmonary:     Effort: Pulmonary effort is normal. No respiratory distress.     Breath sounds: No stridor.  Abdominal:     General: There is no distension.  Skin:  General: Skin is warm and dry.  Neurological:     Mental Status: He is alert and oriented to person, place, and time.     ED Results / Procedures / Treatments   Labs (all labs ordered are listed, but only abnormal results are displayed) Labs Reviewed  COMPREHENSIVE METABOLIC PANEL WITH GFR - Abnormal; Notable for the following components:      Result Value   BUN 43 (*)    Creatinine, Ser 16.43 (*)    Calcium  8.7 (*)    Albumin  3.4 (*)    GFR, Estimated 3 (*)    All other components within normal limits  CBC WITH DIFFERENTIAL/PLATELET - Abnormal; Notable for the following components:   RBC 3.42 (*)    Hemoglobin 10.6 (*)    HCT 32.4 (*)    Platelets 92 (*)    All other components within normal limits  ETHANOL  CBG MONITORING, ED     EKG EKG Interpretation Date/Time:  Friday Oct 18 2023 08:45:20 EDT Ventricular Rate:  73 PR Interval:  185 QRS Duration:  109 QT Interval:  438 QTC Calculation: 483 R Axis:   38  Text Interpretation: Sinus rhythm RSR' in V1 or V2, right VCD or RVH Borderline prolonged QT interval Confirmed by Dorenda Gandy [phone]) on 10/18/2023 8:50:55 AM  Radiology No results found.  Procedures Procedures    Medications Ordered in ED Medications  levETIRAcetam  (KEPPRA ) IVPB 1000 mg/100 mL premix (0 mg Intravenous Stopped 10/18/23 1010)    ED Course/ Medical Decision Making/ A&P                                 Medical Decision Making Patient with end-stage renal disease, seizure disorder presents after witnessed seizure.  Concern for breakthrough seizure, electrolyte abnormalities, infection.  Patient placed on monitoring, labs commenced. Cardiac 80 sinus normal pulse ox 95% room air borderline  Amount and/or Complexity of Data Reviewed Independent Historian: EMS External Data Reviewed: notes. Labs: ordered. Decision-making details documented in ED Course. ECG/medicine tests: ordered and independent interpretation performed. Decision-making details documented in ED Course.  Risk Prescription drug management. Decision regarding hospitalization. Diagnosis or treatment significantly limited by social determinants of health.   10:46 AM  Patient in no distress, awake, alert, states that he is ready to go.  He has had no additional seizure activity has had Keppra  load, labs are consistent with prior no substantial abnormality in potassium, patient appropriate for outpatient dialysis encouraged to continue taking his medication as prescribed.  Final Clinical Impression(s) / ED Diagnoses Final diagnoses:  Seizure [facility])    Rx / DC Orders ED Discharge Orders     None         Dorenda Gandy, MD 10/18/23 1046

## 2023-10-20 ENCOUNTER — Other Ambulatory Visit: Payer: Self-pay

## 2023-10-20 ENCOUNTER — Encounter (HOSPITAL_COMMUNITY): Payer: Self-pay

## 2023-10-20 ENCOUNTER — Inpatient Hospital Stay (HOSPITAL_COMMUNITY)
Admission: EM | Admit: 2023-10-20 | Discharge: 2023-11-12 | DRG: 100 | Disposition: A | Discharge status: Skilled Nursing Facility | Attending: Student | Admitting: Student

## 2023-10-20 ENCOUNTER — Emergency Department (HOSPITAL_COMMUNITY)

## 2023-10-20 ENCOUNTER — Emergency Department (HOSPITAL_COMMUNITY)
Admission: EM | Admit: 2023-10-20 | Discharge: 2023-10-20 | Disposition: A | Discharge status: Home / Self Care | Source: Home / Self Care | Attending: Emergency Medicine | Admitting: Emergency Medicine

## 2023-10-20 DIAGNOSIS — G9341 Metabolic encephalopathy: Secondary | ICD-10-CM

## 2023-10-20 DIAGNOSIS — F05 Delirium due to known physiological condition: Secondary | ICD-10-CM | POA: Diagnosis present

## 2023-10-20 DIAGNOSIS — G40209 Localization-related (focal) (partial) symptomatic epilepsy and epileptic syndromes with complex partial seizures, not intractable, without status epilepticus: Secondary | ICD-10-CM | POA: Diagnosis not present

## 2023-10-20 DIAGNOSIS — N189 Chronic kidney disease, unspecified: Secondary | ICD-10-CM | POA: Diagnosis present

## 2023-10-20 DIAGNOSIS — E162 Hypoglycemia, unspecified: Secondary | ICD-10-CM

## 2023-10-20 DIAGNOSIS — D631 Anemia in chronic kidney disease: Secondary | ICD-10-CM | POA: Diagnosis present

## 2023-10-20 DIAGNOSIS — R488 Other symbolic dysfunctions: Secondary | ICD-10-CM | POA: Diagnosis present

## 2023-10-20 DIAGNOSIS — Z79899 Other long term (current) drug therapy: Secondary | ICD-10-CM

## 2023-10-20 DIAGNOSIS — M329 Systemic lupus erythematosus, unspecified: Secondary | ICD-10-CM | POA: Diagnosis present

## 2023-10-20 DIAGNOSIS — N186 End stage renal disease: Secondary | ICD-10-CM | POA: Insufficient documentation

## 2023-10-20 DIAGNOSIS — I63411 Cerebral infarction due to embolism of right middle cerebral artery: Secondary | ICD-10-CM | POA: Diagnosis not present

## 2023-10-20 DIAGNOSIS — R569 Unspecified convulsions: Secondary | ICD-10-CM

## 2023-10-20 DIAGNOSIS — E66811 Obesity, class 1: Secondary | ICD-10-CM | POA: Diagnosis present

## 2023-10-20 DIAGNOSIS — Z91148 Patient's other noncompliance with medication regimen for other reason: Secondary | ICD-10-CM

## 2023-10-20 DIAGNOSIS — R404 Transient alteration of awareness: Secondary | ICD-10-CM | POA: Insufficient documentation

## 2023-10-20 DIAGNOSIS — T461X6A Underdosing of calcium-channel blockers, initial encounter: Secondary | ICD-10-CM | POA: Diagnosis present

## 2023-10-20 DIAGNOSIS — I953 Hypotension of hemodialysis: Secondary | ICD-10-CM | POA: Diagnosis not present

## 2023-10-20 DIAGNOSIS — Z992 Dependence on renal dialysis: Secondary | ICD-10-CM | POA: Insufficient documentation

## 2023-10-20 DIAGNOSIS — D539 Nutritional anemia, unspecified: Secondary | ICD-10-CM | POA: Diagnosis present

## 2023-10-20 DIAGNOSIS — D649 Anemia, unspecified: Secondary | ICD-10-CM | POA: Diagnosis present

## 2023-10-20 DIAGNOSIS — I160 Hypertensive urgency: Secondary | ICD-10-CM | POA: Insufficient documentation

## 2023-10-20 DIAGNOSIS — R760 Raised antibody titer: Secondary | ICD-10-CM | POA: Diagnosis present

## 2023-10-20 DIAGNOSIS — I120 Hypertensive chronic kidney disease with stage 5 chronic kidney disease or end stage renal disease: Secondary | ICD-10-CM | POA: Insufficient documentation

## 2023-10-20 DIAGNOSIS — Z6831 Body mass index (BMI) 31.0-31.9, adult: Secondary | ICD-10-CM

## 2023-10-20 DIAGNOSIS — Z781 Physical restraint status: Secondary | ICD-10-CM

## 2023-10-20 DIAGNOSIS — R4182 Altered mental status, unspecified: Principal | ICD-10-CM

## 2023-10-20 DIAGNOSIS — Z5901 Sheltered homelessness: Secondary | ICD-10-CM

## 2023-10-20 DIAGNOSIS — D696 Thrombocytopenia, unspecified: Secondary | ICD-10-CM | POA: Diagnosis present

## 2023-10-20 DIAGNOSIS — Z7901 Long term (current) use of anticoagulants: Secondary | ICD-10-CM | POA: Insufficient documentation

## 2023-10-20 DIAGNOSIS — W19XXXA Unspecified fall, initial encounter: Secondary | ICD-10-CM | POA: Diagnosis not present

## 2023-10-20 DIAGNOSIS — D6861 Antiphospholipid syndrome: Secondary | ICD-10-CM | POA: Diagnosis present

## 2023-10-20 DIAGNOSIS — R29710 NIHSS score 10: Secondary | ICD-10-CM | POA: Diagnosis not present

## 2023-10-20 DIAGNOSIS — I10 Essential (primary) hypertension: Secondary | ICD-10-CM | POA: Diagnosis present

## 2023-10-20 DIAGNOSIS — I639 Cerebral infarction, unspecified: Secondary | ICD-10-CM

## 2023-10-20 DIAGNOSIS — G9340 Encephalopathy, unspecified: Secondary | ICD-10-CM | POA: Diagnosis present

## 2023-10-20 DIAGNOSIS — Z8673 Personal history of transient ischemic attack (TIA), and cerebral infarction without residual deficits: Secondary | ICD-10-CM

## 2023-10-20 DIAGNOSIS — F17290 Nicotine dependence, other tobacco product, uncomplicated: Secondary | ICD-10-CM | POA: Diagnosis present

## 2023-10-20 DIAGNOSIS — N2581 Secondary hyperparathyroidism of renal origin: Secondary | ICD-10-CM | POA: Diagnosis present

## 2023-10-20 DIAGNOSIS — T447X6A Underdosing of beta-adrenoreceptor antagonists, initial encounter: Secondary | ICD-10-CM | POA: Diagnosis present

## 2023-10-20 DIAGNOSIS — E785 Hyperlipidemia, unspecified: Secondary | ICD-10-CM | POA: Diagnosis present

## 2023-10-20 DIAGNOSIS — R5381 Other malaise: Secondary | ICD-10-CM | POA: Diagnosis present

## 2023-10-20 LAB — CBC WITH DIFFERENTIAL/PLATELET
Abs Immature Granulocytes: 0.02 10*3/uL (ref 0.00–0.07)
Basophils Absolute: 0.1 10*3/uL (ref 0.0–0.1)
Basophils Relative: 1 %
Eosinophils Absolute: 0.3 10*3/uL (ref 0.0–0.5)
Eosinophils Relative: 3 %
HCT: 33.7 % — ABNORMAL LOW (ref 39.0–52.0)
Hemoglobin: 11.1 g/dL — ABNORMAL LOW (ref 13.0–17.0)
Immature Granulocytes: 0 %
Lymphocytes Relative: 13 %
Lymphs Abs: 1 10*3/uL (ref 0.7–4.0)
MCH: 30.9 pg (ref 26.0–34.0)
MCHC: 32.9 g/dL (ref 30.0–36.0)
MCV: 93.9 fL (ref 80.0–100.0)
Monocytes Absolute: 0.8 10*3/uL (ref 0.1–1.0)
Monocytes Relative: 11 %
Neutro Abs: 5.4 10*3/uL (ref 1.7–7.7)
Neutrophils Relative %: 72 %
Platelets: 110 10*3/uL — ABNORMAL LOW (ref 150–400)
RBC: 3.59 MIL/uL — ABNORMAL LOW (ref 4.22–5.81)
RDW: 13.6 % (ref 11.5–15.5)
WBC: 7.6 10*3/uL (ref 4.0–10.5)
nRBC: 0 % (ref 0.0–0.2)

## 2023-10-20 LAB — CBG MONITORING, ED
Glucose-Capillary: 89 mg/dL (ref 70–99)
Glucose-Capillary: 68 mg/dL — ABNORMAL LOW (ref 70–99)
Glucose-Capillary: 92 mg/dL (ref 70–99)

## 2023-10-20 LAB — COMPREHENSIVE METABOLIC PANEL WITH GFR
ALT: 13 U/L (ref 0–44)
AST: 18 U/L (ref 15–41)
Albumin: 3.5 g/dL (ref 3.5–5.0)
Alkaline Phosphatase: 60 U/L (ref 38–126)
Anion gap: 16 — ABNORMAL HIGH (ref 5–15)
BUN: 41 mg/dL — ABNORMAL HIGH (ref 6–20)
CO2: 26 mmol/L (ref 22–32)
Calcium: 9 mg/dL (ref 8.9–10.3)
Chloride: 99 mmol/L (ref 98–111)
Creatinine, Ser: 16.18 mg/dL — ABNORMAL HIGH (ref 0.61–1.24)
GFR, Estimated: 3 mL/min — ABNORMAL LOW (ref >60)
Glucose, Bld: 93 mg/dL (ref 70–99)
Potassium: 3.8 mmol/L (ref 3.5–5.1)
Sodium: 141 mmol/L (ref 135–145)
Total Bilirubin: 1 mg/dL (ref 0.0–1.2)
Total Protein: 7.5 g/dL (ref 6.5–8.1)

## 2023-10-20 LAB — ETHANOL: Alcohol, Ethyl (B): <15 mg/dL (ref <15)

## 2023-10-20 MED ORDER — LEVETIRACETAM IN NACL 1000 MG/100ML IV SOLN
1000.0000 mg | Freq: Once | INTRAVENOUS | Status: DC
  Filled 2023-10-20: qty 100

## 2023-10-20 MED ORDER — HYDRALAZINE HCL 25 MG PO TABS: 50.0000 mg | ORAL_TABLET | Freq: Once | ORAL | Status: DC

## 2023-10-20 MED ORDER — LEVETIRACETAM 500 MG PO TABS: 1000.0000 mg | ORAL_TABLET | Freq: Once | ORAL | Status: DC

## 2023-10-20 MED ORDER — SODIUM CHLORIDE 0.9 % IV SOLN
2000.0000 mg | Freq: Once | INTRAVENOUS | Status: AC
  Administered 2023-10-20: 2000 mg via INTRAVENOUS
  Filled 2023-10-20: qty 20

## 2023-10-20 MED ORDER — LORAZEPAM 2 MG/ML IJ SOLN
1.0000 mg | Freq: Once | INTRAMUSCULAR | Status: AC
  Administered 2023-10-20: 1 mg via INTRAVENOUS
  Filled 2023-10-20: qty 1

## 2023-10-20 NOTE — ED Notes (Signed)
 Pt agitated, jerking away, stating you're getting on my nerves when attempting to start IV and draw blood PA Prosperi notified.

## 2023-10-20 NOTE — ED Notes (Signed)
 AMA refusal form signed with second RN.

## 2023-10-20 NOTE — ED Provider Notes (Signed)
 [place] EMERGENCY DEPARTMENT AT [facility] Provider Note   CSN: [id number] Arrival date & time: 10/20/23  1352     History  No chief complaint on file.   [name] [name] is a 43 y.o. male with past medical history significant for previous CVA, hypertension, CKD, seizure disorder who presents concern for witnessed seizure while at dialysis today.  Similar presentation just 2 days ago.  He reports that he has been able to take his Keppra , he was described as staring off into space, acting abnormally for a few minutes, he is returning overall to normal mental status at this time.  He denies any significant complaint.  He has been going to dialysis as scheduled.  Denies any alcohol, drug use.  HPI     Home Medications Prior to Admission medications   Medication Sig Start Date End Date Taking? Authorizing Provider  acetaminophen  (TYLENOL ) 325 MG tablet Take 2 tablets (650 mg total) by mouth every 6 (six) hours as needed for mild pain. 02/13/23   Abbe Abate, MD  amLODipine  (NORVASC ) 10 MG tablet Take 10 mg by mouth at bedtime. 06/03/23   [provider]  apixaban  (ELIQUIS ) 2.5 MG TABS tablet Take 2.5 mg by mouth 2 (two) times daily.    [provider]  atorvastatin  (LIPITOR) 40 MG tablet Take 1 tablet (40 mg total) by mouth daily. 08/03/23   Aisha Hove, MD  AURYXIA  1 GM 210 MG(Fe) tablet Take 840 mg by mouth 3 (three) times daily. 10/07/23   [provider]  carvedilol  (COREG ) 6.25 MG tablet Take 1 tablet (6.25 mg total) by mouth 2 (two) times daily with a meal. 08/03/23   Aisha Hove, MD  docusate sodium  (COLACE) 100 MG capsule Take 1 capsule (100 mg total) by mouth 2 (two) times daily as needed for mild constipation. 08/03/23   Aisha Hove, MD  levETIRAcetam  (KEPPRA ) 500 MG tablet Take 2 tablets (1,000 mg total) by mouth 2 (two) times daily. 09/25/23 10/25/23  Countryman, Chase, MD  multivitamin (RENA-VIT) TABS tablet Take  1 tablet by mouth at bedtime. 08/03/23   Aisha Hove, MD  sevelamer  carbonate (RENVELA ) 800 MG tablet Take 800 mg by mouth 3 (three) times daily with meals.    [provider]      Allergies    Chlorhexidine  and Cetirizine & related    Review of Systems   Review of Systems  All other systems reviewed and are negative.   Physical Exam Updated Vital Signs BP (!) 207/126   Pulse 91   Resp (!) 22   SpO2 94%  Physical Exam Vitals and nursing note reviewed.  Constitutional:      General: He is not in acute distress.    Appearance: Normal appearance.  HENT:     Head: Normocephalic and atraumatic.  Eyes:     General:        Right eye: No discharge.        Left eye: No discharge.  Cardiovascular:     Rate and Rhythm: Normal rate and regular rhythm.     Heart sounds: No murmur heard.    No friction rub. No gallop.  Pulmonary:     Effort: Pulmonary effort is normal.     Breath sounds: Normal breath sounds.  Abdominal:     General: Bowel sounds are normal.     Palpations: Abdomen is soft.  Skin:    General: Skin is warm and dry.     Capillary Refill:  Capillary refill takes less than 2 seconds.  Neurological:     Mental Status: He is alert and oriented to person, place, and time.     Comments: Moves all 4 limbs spontaneously, CN II through XII grossly intact, can ambulate without difficulty, intact sensation throughout.   Psychiatric:        Mood and Affect: Mood normal.        Behavior: Behavior normal.     ED Results / Procedures / Treatments   Labs (all labs ordered are listed, but only abnormal results are displayed) Labs Reviewed  CBC  COMPREHENSIVE METABOLIC PANEL WITH GFR  ETHANOL  CBG MONITORING, ED    EKG None  Radiology No results found.  Procedures Procedures    Medications Ordered in ED Medications  levETIRAcetam  (KEPPRA ) tablet 1,000 mg (has no administration in time range)  hydrALAZINE  (APRESOLINE ) tablet 50 mg (has no  administration in time range)    ED Course/ Medical Decision Making/ A&P Clinical Course as of 10/20/23 1521  Sun Oct 20, 2023  1451 Able to contact friend -- he does have some abnormal activity [CP]    Clinical Course User Index [CP] Nelly Banco, PA-C                                 Medical Decision Making Amount and/or Complexity of Data Reviewed Labs: ordered.  Risk Prescription drug management.   This patient is a 43 y.o. male  who presents to the ED for concern of reported seizure activity.   Differential diagnoses prior to evaluation: The emergent differential diagnosis includes, but is not limited to,  CVA, seizure, hypotension, sepsis, hypoglycemia, hypoxic encephalopathy, metabolic encephalopathy, polypharmacy, substance abuse, developing dementia or alzheimers, meningitis, encephalitis, hypertensive emergency, other systemic infection, acute alcohol intoxication, acute alcohol or other drug withdrawal or psychiatric manifestation vs other . This is not an exhaustive differential.   Past Medical History / Co-morbidities / Social History: Hypertension, seizures, ESRD on dialysis, previous stroke  Additional history: Chart reviewed. Pertinent results include: Reviewed lab work, imaging from recent previous emergency department.  Physical Exam: Physical exam performed. The pertinent findings include: Quite hypertensive in the emergency department, blood pressure 204/131, vital signs otherwise stable.  His affect is somewhat odd, he does not seem to have any focal neurologic deficits, he is responding to questions but is aggressive, and uncooperative, followed by periods of being somewhat unresponsive to questioning despite clearly hearing what is being said.  He is asked the year and responds "I do not know you tell me what year it is".  He has been verbally aggressive towards staff and refuses blood work.  Would like to complete lab work related to his possible  recurrent seizures, early controlled hypertension, but patient is declining at this time, discussed that we really recommend medical evaluation of his recent return of seizures, and his poorly controlled hypertension but patient refuses any additional blood work.  He seems to have medical capacity to choose to leave at this time and ultimately will leave AGAINST MEDICAL ADVICE of his own volition.  Medications: Oral dose of Keppra  and hydralazine  given prior to arrival for blood pressure and reported seizure activity.  Disposition: After consideration of the diagnostic results and the patients response to treatment, I feel that again from medical perspective we are concerned by his recurrent reported seizures, as well as uncontrolled hypertension, but he request to leave and declines  any medical treatment, do not feel that he meets criteria to be involuntarily committed for completion of his medical treatment, instructed that he can return for treatment and workup if he so chooses.   Discussed risks of leaving without completing medical treatment including sudden death, stroke, heart attack, recurrent seizures, status epilepticus versus other.  emergency department workup does not suggest an emergent condition requiring admission or immediate intervention beyond what has been performed at this time. The plan is: as above. The patient is safe for discharge and has been instructed to return immediately for worsening symptoms, change in symptoms or any other concerns.  Final Clinical Impression(s) / ED Diagnoses Final diagnoses:  Seizure (HCC)  Transient alteration of awareness  Hypertensive urgency    Rx / DC Orders ED Discharge Orders     None         Nelly Banco, PA-C 10/20/23 1521    Auston Blush, MD 10/21/23 1452

## 2023-10-20 NOTE — ED Provider Notes (Incomplete)
 43 year old male discharged by prior shift. Patient's friend arrived to take him home and was concerned patient was not acting per his usual. Specifically, patient started taking off his clothes rather than preparing to leave.  Patient is able to state his name. States that he ate dinner tonight and that he ate a sandwich, per nursing staff, patient has not eaten anything.   Prior shift head CT, labs reviewed, no significant abnormalities compared to baseline. Patient was given Keppra  and Ativan .   Plan to monitor in the department. Will provide food/drink, monitor CBG.    Physical Exam  BP (!) 162/121   Pulse 90   Temp 99 F (37.2 C)   Resp 20   SpO2 100%   Physical Exam  Procedures  Procedures  ED Course / MDM    Medical Decision Making Amount and/or Complexity of Data Reviewed Labs: ordered. Radiology: ordered.  Risk Prescription drug management. Decision regarding hospitalization.   0015hrs per nursing staff, pt ready to go home. I returned to the room, patient sleeping, rouses to verbal stimuli. Adolm Ahumada (friend) at bedside states pt still not baseline. I asked the patient what his friend's name was, he states "Herb" "[employer]"  Per friend, patient had a seizure on Friday and fell and hit his head, came to the ER that day and was dc. Stayed in bed all day Saturday, has not been self since the fall on Friday. Friend states patient does drink alcohol. Patient went to dialysis Sunday but was sent to the ER from dialysis for concern for seizure like activity- staring into space. Patient was brought to the ER but refused work up and was dc. Brought back tonight by PD for altered mental status.   Will add ammonia, continue to monitor.   0100hrs Patient refusing labs, IVC papers completed due to lack of capacity to make decision. Pt alert to name and place only. Order for IM Ativan  if needed. Consult to hospitalist for admission.   [id number] Case discussed with Dr. [facility] Lavender with  [facility] who will consult for admission.       Darlis Eisenmenger, PA-C 10/21/23 0155    Darlis Eisenmenger, PA-C 10/21/23 0155    Lindle Rhea, MD 10/22/23 [phone]

## 2023-10-20 NOTE — Discharge Instructions (Addendum)
 [employer] assistance programs Crisis assistance programs  -[facility]eless[facility] Arts development officer. If you are experiencing homelessness in [place], [place] , your first point of contact should be Pensions consultant. You can reach Coordinated Entry by calling (336) ([phone] or by emailing [email or web address].  Community access points: [employer] [phone] [address], [place]) every Tuesday from 9am-10am. [facility] ([address] [place]. [address], [place]) every Wednesday from 8am-9am.   -[place] Coordinated Re-entry Jayson Michael: Dial  211 and request. Offers referrals to homeless shelters in the area.    -The [employer] 507-327-3581) offers several services to local families, as funding allows. The Emergency Assistance Program (EAP), which they administer, provides household goods, free food, clothing, and financial aid to people in need in the [place]  area. The EAP program does have some qualification, and counselors will interview clients for financial assistance by written referral only. Referrals need to be made by the Department of Social Services or by other EAP approved human services agencies or charities in the area.  -[facility] of [employer], which can be reached at ([phone] [phone], offers emergency assistance programs for those in need of help, such as food, rent assistance, a soup kitchen, shelter, and clothing. They are based in [facility]  but provide a number of services to those that qualify for assistance.   [facility] Department of Social Services may be able to offer temporary financial assistance and cash grants for paying rent and utilities, Help may be provided for local county residents who may be experiencing personal crisis when other resources, including government programs, are not available. Call [phone]  -High [employer] is a [employer] agency, The organization can offer emergency assistance for paying rent, Care[employer] Rx, utilities, food, household products and furniture. They offer extensive emergency and transitional housing for families, children and single women, and also run a Boy's and [employer]. Thrift Shops, Secondary school teacher, and other aid offered too. [address] [place], [place]  [address], [phone]  -[place] Low Income Energy Assistance Program -- This is offered for [facility] families. The federal government created [employer] Program provides a one-time cash grant payment to help eligible low-income families pay their electric and heating bills. [address] [place], [place]  [address], [phone]  -[place] Emergency Assistance -- A program offers emergency utility and rent funds for greater [place] area residents. The program can also provide counseling and referrals to charities and government programs. Also provides food and a free meal program that serves lunch Mondays - Saturdays and dinner seven days per week to individuals in the community. [address] [place], [place]  [address], [phone]  -[employer] - Offers affordable apartment and housing communities across      [place] and [place]. The low income and seniors can access public housing, rental assistance to qualified applicants, and apply for the section 8 rent subsidy program. Other programs include Chiropractor and Engineer, maintenance. [address] [place], [place]  [address], dial  [phone].  -The [facility] provides transitional housing to veterans and the disabled. Clients will also access other services too, including assistance in applying for Disability, life skills classes, case management, and assistance in finding permanent housing. [address] [place] [address] [place], [place]  [place] [address], call [phone]  -[place] Transitional Housing through [facility] is for people who were just  evicted or that are formerly homeless. The non-profit will also help then gain self-sufficiency, find a home or apartment to live in, and also provides information on rent assistance when needed. Phone [phone]  -The [place] [facility] [facility] helps low income, elderly, or disabled residents in seven counties in the [place] ([place], [place], [place], [place], [place], [place], [place], and [place]) save energy and reduce their utility bills by improving energy efficiency. Phone [phone].  -[employer] is located in the [place] [facility] in the [employer], [address], [place], [place] [address]. Parking is in the rear of the building. Phone: [phone]   General Email: Robina Chol  GHC provides free housing counseling assistance in locating affordable rental housing or housing with support services for families and individuals in crisis and the chronically homeless. We provide potential resources for other housing needs like utilities. Our trained counselors also work with clients on budgeting and financial literacy in effort to empower them to take control of their financial situations. [employer] collaborates with homeless service providers and other stakeholders as part of the [employer] COC (Continuum of Care). The (COC) is a regional/local planning body that coordinates housing and services funding for homeless families and individuals. The role of [facility] in the COC is through housing counseling to work with people we serve on diversion strategies for those that are at imminent risk of becoming homeless. We also work with the Coordinated Assessment/Entry Specialist who attempts to find temporary solutions and/or connects the people  to Housing First, Rapid Re-housing or transitional housing programs. Our Homelessness Prevention Housing Counselors meet with clients on business days (Monday-Fridays, except scheduled holidays) from 8:30 am to 4:30 pm.  Legal assistance for evictions, foreclosure, and more -If you need free legal advice on civil issues, such as foreclosures, evictions, Electronics engineer, government programs, domestic issues and more, Landscape architect of [place]  [facility]) is a Associate Professor firm that provides free legal services and counsel to lower income people, seniors, disabled, and others, The goal is to ensure everyone has access to justice and fair representation. Call them at ([phone].  [facility] for Housing and Community Studies can provide info about obtaining legal assistance with evictions. Phone [phone].  Data processing manager  The [employer], [employer]. offers job and Dispensing optician. Resources are focused on helping students obtain the skills and experiences that are necessary to compete in today's challenging and tight job market. The non-profit faith-based community action agency offers internship trainings as well as classroom instruction. Classes are tailored to meet the needs of people in the [facility] region. [place], [place] [address], [phone]  Foreclosure prevention/Debt Services Family Services of the [employer] Credit Counseling Service inludes debt and foreclosure prevention programs for local families. This includes money management, financial advice, budget review and development of a written action plan with a Pensions consultant to help solve specific individual financial problems. In addition, housing and mortgage counselors can also provide pre- and post-purchase homeownership counseling, default resolution counseling (to prevent foreclosure) and reverse mortgage counseling. A Debt Management Program allows  people and families with a high level of credit card or medical debt to consolidate and repay consumer debt and loans to creditors and rebuild positive credit ratings and scores. Contact (336) D7650557.  Community clinics in [place] -Health Department [facility]: [address] [place], [place], [address]. [phone].  -Health Department [place] [facility]: [phone]  [address], [facility], [address]. [phone].  -[facility] offers medical care through a group of doctors, pharmacies and other healthcare related agencies that offer services for low income, uninsured adults in [place]. Also offers adult Dental care and assistance with applying for an [employer]. Call [phone].   [facility]. This center provides low-cost health care to those without health insurance. Services offered include an onsite pharmacy. Phone [phone]. [address], [address], [place].  -Medication Assistance Program serves as a link between pharmaceutical companies and patients to provide low cost or free prescription medications. This service is available for residents who meet certain income restrictions and have no insurance coverage. PLEASE CALL [phone] Jonette Nestle) OR [phone] ([place])  -One Step Further: Materials engineer, The [employer] Support & Nutrition Program, [employer]. Call [phone]/ [phone].  Food pantry and assistance -Urban Ministry-Food Bank: [address], [place] [address]. Phone [phone]  -Bless[facility] Table Food Pantry: [address] [place], [place] [address]. [phone].  -Missionary Ministry: has the purpose of visiting the sick and shut-ins and provide for needs in the surrounding communities. Call [phone]. Email: [email or web address] This program provides: Food box for seniors, Financial assistance, Food to meet basic  nutritional needs.  -Meals on Wheels with Senior Resources: [facility] residents age 14 and over who are homebound and unable to obtain and prepare a nutritious meal for themselves are eligible for this service. There may be a waiting list in certain parts of [facility] if the route in that area is full. If you are in [facility] and [place] call [phone] to register. For all other areas call [phone] to register.  -Greater Dietitian: https://findfood.BargainContractor.[email or web address]  TRANSPORTATION: -[employer] Department of Health: Call [facility] and [employer] at [phone] for details. AttractionGuides.es  -Access [place]: Access [place] is the [employer] Agency's shared-ride transportation service for eligible riders who have a disability that prevents them from riding the fixed route bus. Call [phone]. Access [place] riders must pay a fare of $1.50 per trip, or may purchase a 10-ride punch card for $14.00 ($1.40 per ride) or a 40-ride punch card for $48.00 ($1.20 per ride).  -The Shepherd's WHEELS rideshare transportation service is provided for senior citizens (60+) who live independently within [place] city limits and are unable to drive or have limited access to transportation. Call [phone] to schedule an appointment.  -[place] Transportation: For Medicare or Medicaid recipients call [phone]?[facility], wheelchair Carloyn Chi, and ambulatory quotes available.   FLEEING VIOLENCE: -Family Services of the [place]- 24/7 Crisis line ([phone]) -[facility] Justice Centers: (336) 641-SAFE [phone])   2-1-1 is another useful way to locate resources in the community. Visit ShedSizes.ch to find service information online. If you need additional assistance, 2-1-1 Referral Specialists are available 24 hours a day, every day by dialing  2-1-1 or [phone] from any phone. The call is free, confidential, and available in any language.  Affordable Housing Search http://www.nchousingsearch.[facility])   M-F 8a-3p [address] [place]  [place], [place] [address] [phone] Services include: laundry, barbering, support groups, case management, phone & computer access, showers, AA/NA mtgs, mental health/substance abuse nurse, job skills class, disability information, VA assistance, spiritual classes, etc. Winter Shelter available when temperatures are less than 32 degrees.   HOMELESS SHELTERS [facility] at [facility]- Call [phone] ext. [phone]  or ext. [phone]. Located at [address] [place], [place] [address]  [facility] Mens Shelter- Call [phone]. Located at [address] [place] [address].  [facility]- [employer]. Call [phone]. Office located at [address], [place] [address].  Pathways Family Housing through [place] [phone].  [facility] Family Shelter- Call [phone]. Located at [address] [place], [place], [place] [address].  Room at the Inn-For Pregnant mothers. Call [phone]. Located at [address] [place], [address].  Home Shelter of Hope-For men in [place]. Call [phone]. [facility]-Shelter in [place]. Call [phone].  Home of [employer] for [employer] [phone]. Office located at [address] [place], [address].  [employer] be agreeable to help with chores. Call [phone] ext. [phone].  Men's: [address] [place],  [address]. Women's: [facility]  [address] [place], [place] [address]  Crisis Services [facility] Mobile Crisis Management- [phone]  [facility] [address], [place], [place] [address]. Phone: [phone] Homeless Shelter  List:  Health and safety inspector [facility] [place]) [address] [place], [place]  Phone: [phone]  [facility] Men's [facility]ter [address] [place], [place] [address] Phone: [phone]  [facility] (Women only) [address]Margarita Shear [place], [place] [address] Phone: [phone]  [facility] [address], [place] [address] Phone: [phone]  [facility]: [phone]. [address] [place], [place] [address] Phone: [phone]  [facility] Overflow Shelter  [address] [place], [place] [address] (Check in at 6:00PM for placement at a local shelter) Phone: [phone]  Rent/Utility Assistance in [facility]:  INNOVATIVE PATHWAYS [address] [place], [place] [address] [phone] Mon 8:00am - 6:00pm; Tue 8:00am - 6:00pm; Wed 8:00am - 6:00pm; Thu 8:00am - 6:00pm; Fri 8:00am - 6:00pm; Email: [email or web address] Eligibility: Residents of [place], [place], [place], [place], [place] and [place] that meet income limits. Call or text for eligibility screening.   [facility] [address] [place], [place], [place] [address] [phone] (Main: Rental Assistance) [phone] (Main: Utility Assistance) Mon 8:30am - 5:00pm; Tue 8:30am - 5:00pm; Wed 8:30am - 5:00pm; Thu 8:30am - 5:00pm; Fri 8:30am - 5:00pm; Website: http://www.greensborourbanministry.org/emergency-assistance-program Eligibility: People who have an unexpected crisis or emergency that can be verified. Must have some form of income and meet income limits. At the first of the month, only helps with rent/mortgage assistance for those who have court ordered eviction notices. Call for application information. Call for exact documents that will be needed. Examples of documents that may be needed: Photo ID, Social Security cards for everyone in the household, and proof of income for previous  2 months. Copy of eviction notice for rent assistance and copy of final notice for utility assistance. Statements or receipts of bills for previous 2 months.   [facility] - [place] [address], [place], [place] [address] [phone] (Main) [phone] (Alternate) Mon 9:00am - 5:00pm; Tue 9:00am - 5:00pm; Wed 9:00am - 5:00pm; Thu 9:00am - 5:00pm; Fri 9:00am - 5:00pm; Website: http://[email or web address].salvationarmy.org/[place]/emergency-financial-assistance Email: [email or web address] Eligibility: People experiencing a housing crisis with past-due rent and/or utilities and meet income limits. Must be willing to take part in 6 Call or visit website to download application. Return complete application by mail or email only. Documents: Help with Utilities: Photo ID, proof of household income, copies of monthly bills  or receipts, and a final disconnection/shut-off notice. Help with Rent or Mortgage: Photo ID, proof of income, copies of monthly bills or receipts, and eviction notice. Help with Household Goods: Photo ID, proof of household income, copies of monthly bills or receipts, and a fire or flood report.  [facility] [place] [address] [place], [place] [address] [phone] (Main) Mon 8:00am - 5:00pm; Tue 8:00am - 5:00pm; Wed 8:00am - 5:00pm; Thu 8:00am - 5:00pm; Fri 8:00am - 12:00pm; Website: http://[email or web address].salvationarmy.org/[place]/emergency-financial-assistance Email: [email or web address] Call for eligibility information. Apply :Utilities Assistance: Visit office by 8:30am on 1st and 4th Monday of each month to pick up application. Rent and Mortgage Assistance: Visit office by 8:30am on 2nd and 3rd Monday of each month to pick up application. NOTE: If Monday falls on a holiday applications can be picked up the following Tuesday. Documents required will be listed on application.  [facility]  [place] [phone] (Main) Seen by appointment only. Call for more information. Eligibility: Meet income limits. Apply: Call for information on how to schedule an appointment. Each month there is a specific day to call to schedule an appointment. It is stated on the agency voicemail message. Appointments fill up quickly each month. Documents: Photo ID, copy of current utility bill.  [facility]S [place] [address] [place], [place] [address] [phone] (Main) Tue 9:00am - 4:00pm; Wed 9:00am - 4:00pm; Thu 9:00am - 4:00pm; Website: http://www.helpinghandshighpoint.org Email: [email or web address]@gmail [email or web address] Eligibility: Utility Assistance: Meet income limits and be a Holiday representative. [employer] customers do not qualify. Must not have received utility assistance for another agency within the last 90 days. Rent Assistance: Residents of [place] who meet income limits. Must not have received rent assistance for another agency within the last 90 days. Apply: Call to schedule an appointment. Documents: Utility Assistance: Photo ID, City of [employer], copy of lease (if not paying a mortgage), proof of income, and monthly expenses. Rent Assistance: Photo ID, W-9 from the landlord, copy of the lease, proof of income, and a list of monthly expenses.  [facility] - [place] [address] [place], [place] [address] [phone] (Main: Help With Rent) [phone] (Main: Help With Utilities) Mon 9:00am - 4:00pm; Tue 9:00am - 4:00pm; Wed 9:00am - 4:00pm; Thu 9:00am - 4:00pm; Fri 9:00am - 4:00pm; Website: MotivationalSites.no Email: [email or web address] Eligibility: People experiencing a financial crisis. Apply: Call to schedule an appointment Wednesday, 7:30am. Documents: Photo ID, Social Security card, proof of income, and proof of address. Other documents may be  required, depending on service. Call for more information.  LOW INCOME ENERGY ASSISTANCE PROGRAM DEPARTMENT OF SOCIAL SERVICES - [facility] [address], [place], [place] [address] [phone] (Main) Mon 8:00am - 5:00pm; Tue 8:00am - 5:00pm; Wed 8:00am - 5:00pm; Thu 8:00am - 5:00pm; Fri 8:00am - 5:00pm; Website: [email or web address] Eligibility: Meet income limits and resource guidelines. Each household is only eligible once, even if multiple members apply. Apply: Call to see if funds are available. Visit to complete an application, call to have 1 mailed, or apply online at [email or web address] NOTE: Households with a person age 54 and over or a person with a documented disability can apply beginning December 1. Other households can apply beginning January 1. Documents: Photo ID, birth certificate, proof of household income, copy of utility bill, latest bank statement, the names and Social Security numbers for everyone in the household, and proof of  disability if under age 55.  LOW INCOME ENERGY ASSISTANCE PROGRAM DEPARTMENT OF SOCIAL SERVICES - [facility] [address] [place], [place], [place] [address] [phone] (Main) Mon 8:00am - 5:00pm; Tue 8:00am - 5:00pm; Wed 8:00am - 5:00pm; Thu 8:00am - 5:00pm; Fri 8:00am - 5:00pm; Website: [email or web address] Eligibility: Meet income limits and resource guidelines. Each household is only eligible once, even if multiple members apply. Apply: Call to see if funds are available. Visit to complete an application, call to have 1 mailed, or apply online at [email or web address] NOTE: Households with a person age 59 and over or a person with a documented disability can apply beginning December 1. Other households can apply beginning January 1. Documents: Photo ID, birth certificate, proof of household income, copy of  utility bill, latest bank statement, the names and Social Security numbers for everyone in the household, and proof of disability if under age 32.

## 2023-10-20 NOTE — ED Triage Notes (Signed)
 Pt bib [facility] that drop him off at the registration desk and left. Pt confused, not answering many questions in triage, States he has had multiple seizures today. Dialysis catheter noted to right chest, pt unsure when his last dialysis was.

## 2023-10-20 NOTE — ED Notes (Signed)
 Spoke with main lab regarding ethanol.  Lab tech confirmed received at [phone]

## 2023-10-20 NOTE — ED Triage Notes (Signed)
 Pt BIB [facility] from motel. Friend reports that pt started zoning out all of a sudden, not making sense, and pacing room stating he needs to leave which lasted about 3-5 mins. Pt is normally A/O x4, EMS reports pt A/O x2 upon their arrival. Pt is a dialysis pt, missed dialysis yesterda. Hx of seizures, non compliant with meds  BP 238/150

## 2023-10-20 NOTE — ED Notes (Signed)
 PA [place] at bedside.

## 2023-10-20 NOTE — ED Provider Notes (Addendum)
 [place] EMERGENCY DEPARTMENT AT [facility] Provider Note  CSN: [id number] Arrival date & time: 10/20/23 1724  Chief Complaint(s) Altered Mental Status and Seizures  HPI [name] [name] is a 43 y.o. male with PMH lupus, ESRD on hemodialysis, previous CVA, seizure disorder who presents emerged part for evaluation of altered mental status and persistent seizures.  Patient was seen this morning and left AMA after refusing all lab work due to recurrent seizure behavior seen in dialysis.  Patient arrives via GPD and is altered more so than previously described earlier this morning.  He is cooperative and willing to have medical evaluation today.  Additional history unable to be obtained given patient's altered mental status.   Past Medical History Past Medical History:  Diagnosis Date   ESRD on hemodialysis (HCC)    Hypertension    Lupus    Seizures (HCC)    Seizures (HCC)    Stroke (HCC)    14 or 43 years of age.     Wears glasses    Patient Active Problem List   Diagnosis Date Noted   History of stroke 08/01/2023   Cerebrovascular accident (CVA) due to embolism of cerebral artery (HCC) 04/04/2023   Seizure (HCC) 01/19/2023   Acute respiratory failure with hypoxia (HCC) 01/19/2023   Status epilepticus (HCC) 08/10/2022   ESRD on hemodialysis (HCC) 02/15/2022   Other disorders of phosphorus metabolism 08/18/2021   Fluid overload, unspecified 05/10/2021   Allergy, unspecified, initial encounter 04/19/2021   Anaphylactic shock, unspecified, sequela 04/19/2021   Pruritus, unspecified 04/19/2021   Hypocalcemia 04/01/2021   Long-term current use of anticonvulsant 08/04/2020   COVID-19 07/26/2020   Breakthrough seizure (HCC) 07/20/2020   Acute respiratory failure with hypoxia (HCC)    Lupus    Seizure, late effect of stroke (HCC) 08/26/2019   Secondary hyperparathyroidism of renal origin (HCC) 11/05/2018   Anemia in chronic kidney disease 09/23/2018   ESRD (end stage  renal disease) (HCC) 03/18/2018   Prediabetes 01/10/2018   Chest pain in adult    CKD (chronic kidney disease) stage 3, GFR 30-59 ml/min (HCC) 10/24/2015   Seizures (HCC) 10/24/2015   HTN (hypertension) 01/12/2015   Chronic anticoagulation 04/24/2011   Lupus anticoagulant positive 04/24/2011   OBESITY, NOS 08/15/2006   Thrombocytopenia (HCC) 08/15/2006   TOBACCO DEPENDENCE 08/15/2006   CVA 08/15/2006   Home Medication(s) Prior to Admission medications   Medication Sig Start Date End Date Taking? Authorizing Provider  acetaminophen  (TYLENOL ) 325 MG tablet Take 2 tablets (650 mg total) by mouth every 6 (six) hours as needed for mild pain. 02/13/23   Abbe Abate, MD  amLODipine  (NORVASC ) 10 MG tablet Take 10 mg by mouth at bedtime. 06/03/23   [provider]  apixaban  (ELIQUIS ) 2.5 MG TABS tablet Take 2.5 mg by mouth 2 (two) times daily.    [provider]  atorvastatin  (LIPITOR) 40 MG tablet Take 1 tablet (40 mg total) by mouth daily. 08/03/23   Aisha Hove, MD  AURYXIA  1 GM 210 MG(Fe) tablet Take 840 mg by mouth 3 (three) times daily. 10/07/23   [provider]  carvedilol  (COREG ) 6.25 MG tablet Take 1 tablet (6.25 mg total) by mouth 2 (two) times daily with a meal. 08/03/23   Aisha Hove, MD  docusate sodium  (COLACE) 100 MG capsule Take 1 capsule (100 mg total) by mouth 2 (two) times daily as needed for mild constipation. 08/03/23   Aisha Hove, MD  levETIRAcetam  (KEPPRA ) 500 MG tablet Take 2  tablets (1,000 mg total) by mouth 2 (two) times daily. 09/25/23 10/25/23  Countryman, Chase, MD  multivitamin (RENA-VIT) TABS tablet Take 1 tablet by mouth at bedtime. 08/03/23   Aisha Hove, MD  sevelamer  carbonate (RENVELA ) 800 MG tablet Take 800 mg by mouth 3 (three) times daily with meals.    [provider]                                                                                                                                     Past Surgical History Past Surgical History:  Procedure Laterality Date   AV FISTULA PLACEMENT Left 12/30/2017   Procedure: ARTERIOVENOUS (AV) FISTULA CREATION VERSUS INSERTION OF ARTERIOVENOUS GRAFT LEFT ARM;  Surgeon: Mayo Speck, MD;  Location: [facility] OR;  Service: Vascular;  Laterality: Left;   BASCILIC VEIN TRANSPOSITION Left 08/15/2018   Procedure: LEFT FIRST STAGE BASCILIC VEIN TRANSPOSITION;  Surgeon: Mayo Speck, MD;  Location: [facility] OR;  Service: Vascular;  Laterality: Left;   BASCILIC VEIN TRANSPOSITION Left 10/30/2018   Procedure: INSERTION OF GORE-TEX GRAFT LEFT UPPER ARM;  Surgeon: Adine Hoof, MD;  Location: [facility] OR;  Service: Vascular;  Laterality: Left;   INSERTION OF DIALYSIS CATHETER N/A 09/20/2018   Procedure: INSERTION OF TUNNELED DIALYSIS CATHETER RIGHT INTERNAL JUGULAR;  Surgeon: Young Hensen, MD;  Location: [facility] OR;  Service: Vascular;  Laterality: N/A;   IR FLUORO GUIDE CV LINE RIGHT  07/16/2022   IR US  GUIDE VASC ACCESS RIGHT  07/16/2022   RENAL BIOPSY     Family History Family History  Problem Relation Age of Onset   Diabetes Mother    Diabetes Maternal Grandmother    Seizures Neg Hx        not on mother's side. he doesn't know about his father's side    Social History Social History   Tobacco Use   Smoking status: Some Days    Types: E-cigarettes   Smokeless tobacco: Never  Vaping Use   Vaping status: Some Days  Substance Use Topics   Alcohol use: Not Currently   Drug use: No   Allergies Chlorhexidine  and Cetirizine & related  Review of Systems Review of Systems  Unable to perform ROS: Mental status change    Physical Exam Vital Signs  I have reviewed the triage vital signs BP (!) 172/129   Pulse (!) 110   Temp 99 F (37.2 C)   Resp 18   SpO2 96%   Physical Exam Constitutional:      General: He is not in acute distress.    Appearance: Normal appearance.  HENT:     Head: Normocephalic and atraumatic.     Nose:  No congestion or rhinorrhea.  Eyes:     General:        Right eye: No discharge.        Left eye: No discharge.     Extraocular Movements: Extraocular  movements intact.     Pupils: Pupils are equal, round, and reactive to light.  Cardiovascular:     Rate and Rhythm: Normal rate and regular rhythm.     Heart sounds: No murmur heard. Pulmonary:     Effort: No respiratory distress.     Breath sounds: No wheezing or rales.  Abdominal:     General: There is no distension.     Tenderness: There is no abdominal tenderness.  Musculoskeletal:        General: Normal range of motion.     Cervical back: Normal range of motion.  Skin:    General: Skin is warm and dry.  Neurological:     General: No focal deficit present.     Mental Status: He is alert. He is disoriented.     ED Results and Treatments Labs (all labs ordered are listed, but only abnormal results are displayed) Labs Reviewed  COMPREHENSIVE METABOLIC PANEL WITH GFR - Abnormal; Notable for the following components:      Result Value   BUN 41 (*)    Creatinine, Ser 16.18 (*)    GFR, Estimated 3 (*)    Anion gap 16 (*)    All other components within normal limits  CBC WITH DIFFERENTIAL/PLATELET - Abnormal; Notable for the following components:   RBC 3.59 (*)    Hemoglobin 11.1 (*)    HCT 33.7 (*)    Platelets 110 (*)    All other components within normal limits  LEVETIRACETAM  LEVEL  ETHANOL  URINALYSIS, ROUTINE W REFLEX MICROSCOPIC  RAPID URINE DRUG SCREEN, HOSP PERFORMED  CBG MONITORING, ED                                                                                                                          Radiology No results found.  Pertinent labs & imaging results that were available during my care of the patient were reviewed by me and considered in my medical decision making (see MDM for details).  Medications Ordered in ED Medications  levETIRAcetam  (KEPPRA ) 2,000 mg in sodium chloride  0.9 % 250 mL  IVPB (2,000 mg Intravenous New Bag/Given 10/20/23 1835)                                                                                                                                     Procedures Procedures  (including critical care time)  Medical Decision Making / ED Course   This patient presents to the ED for concern of seizure, altered mental status, this involves an extensive number of treatment options, and is a complaint that carries with it a high risk of complications and morbidity.  The differential diagnosis includes medication noncompliance, meningitis, posterior reversible encephalopathy syndrome, hyponatremia, convulsive syncope, focal lesion/mass, head trauma, intracerebral hemorrhage, toxins/recreational drugs, pseudoseizure  MDM: Patient seen emerged part for evaluation of seizure behavior.  Physical exam with no focal motor or sensory deficits.  No cranial nerve deficits.  Laboratory evaluation with a hemoglobin of 11.1, BUN 41, creatinine 16.18 and with his history of ESRD on hemodialysis.  Ethanol is negative.  CT head unremarkable.  Patient loaded with Keppra .  He has had no seizure activity while here in the emergency department.  At this time he does not meet inpatient criteria for admission and will be discharged with outpatient follow-up.  I spoke with the patient's primary caregiver who will come to pick up the patient and ensure that he goes to dialysis tomorrow.   Additional history obtained: -Additional history obtained from caregiver -External records from outside source obtained and reviewed including: Chart review including previous notes, labs, imaging, consultation notes   Lab Tests: -I ordered, reviewed, and interpreted labs.   The pertinent results include:   Labs Reviewed  COMPREHENSIVE METABOLIC PANEL WITH GFR - Abnormal; Notable for the following components:      Result Value   BUN 41 (*)    Creatinine, Ser 16.18 (*)    GFR, Estimated 3 (*)     Anion gap 16 (*)    All other components within normal limits  CBC WITH DIFFERENTIAL/PLATELET - Abnormal; Notable for the following components:   RBC 3.59 (*)    Hemoglobin 11.1 (*)    HCT 33.7 (*)    Platelets 110 (*)    All other components within normal limits  LEVETIRACETAM  LEVEL  ETHANOL  URINALYSIS, ROUTINE W REFLEX MICROSCOPIC  RAPID URINE DRUG SCREEN, HOSP PERFORMED  CBG MONITORING, ED      Imaging Studies ordered: I ordered imaging studies including CT head I independently visualized and interpreted imaging. I agree with the radiologist interpretation   Medicines ordered and prescription drug management: Meds ordered this encounter  Medications   levETIRAcetam  (KEPPRA ) 2,000 mg in sodium chloride  0.9 % 250 mL IVPB    -I have reviewed the patients home medicines and have made adjustments as needed  Critical interventions none    Cardiac Monitoring: The patient was maintained on a cardiac monitor.  I personally viewed and interpreted the cardiac monitored which showed an underlying rhythm of: NSR  Social Determinants of Health:  Factors impacting patients care include: Currently lives in a hotel   Reevaluation: After the interventions noted above, I reevaluated the patient and found that they have :improved  Co morbidities that complicate the patient evaluation  Past Medical History:  Diagnosis Date   ESRD on hemodialysis (HCC)    Hypertension    Lupus    Seizures (HCC)    Seizures (HCC)    Stroke (HCC)    47 or 43 years of age.     Wears glasses       Dispostion: I considered admission for this patient, but at this time he does not meet inpatient criteria for admission and will be discharged outpatient follow-up     Final Clinical Impression(s) / ED Diagnoses Final diagnoses:  None     @PCDICTATION @  Karlyn Overman, MD 10/20/23 2100    Karlyn Overman, MD 10/20/23 2101

## 2023-10-20 NOTE — Discharge Instructions (Signed)
 If you would like to return for medical evaluation of your uncontrolled hypertension and seizures you are welcome to we recommend further evaluation including blood work and palpable imaging of your head.

## 2023-10-20 NOTE — ED Notes (Addendum)
 This RN asking pt multiple times if he would like to receive treatment. Pt does not respond. RN asks patient multiple times for a response, however pt continues to sigh and not respond. EDP notified.

## 2023-10-20 NOTE — ED Notes (Addendum)
 This RN asked patient if he would like his belongings/clothing. Pt states no multiple times.

## 2023-10-20 NOTE — ED Notes (Signed)
 Pt noted to have removed IV, gown, underclothes and voided on floor. Pt is confused, unable to answer questions appropriately. Consulting civil engineer notified of need for Recruitment consultant.

## 2023-10-20 NOTE — ED Notes (Addendum)
 Pt refusing to sign AMA form. Pt states multiple times "Why are you guys saying I can't leave?" This RN and secondary RN explained to pt that he is able to leave. This RN also explains to patient that he is able to stay for treatment if he would like, however patient still refuses. Charge nurse notified of situation.

## 2023-10-20 NOTE — ED Notes (Signed)
 Report given to [facility]

## 2023-10-21 ENCOUNTER — Inpatient Hospital Stay (HOSPITAL_COMMUNITY)

## 2023-10-21 ENCOUNTER — Observation Stay (HOSPITAL_COMMUNITY)
Admit: 2023-10-21 | Discharge: 2023-10-21 | Disposition: A | Discharge status: Home / Self Care | Attending: Internal Medicine | Admitting: Internal Medicine

## 2023-10-21 ENCOUNTER — Encounter (HOSPITAL_COMMUNITY): Payer: Self-pay | Admitting: Internal Medicine

## 2023-10-21 DIAGNOSIS — T447X6A Underdosing of beta-adrenoreceptor antagonists, initial encounter: Secondary | ICD-10-CM | POA: Diagnosis present

## 2023-10-21 DIAGNOSIS — G9340 Encephalopathy, unspecified: Secondary | ICD-10-CM | POA: Diagnosis not present

## 2023-10-21 DIAGNOSIS — D631 Anemia in chronic kidney disease: Secondary | ICD-10-CM | POA: Diagnosis present

## 2023-10-21 DIAGNOSIS — I63511 Cerebral infarction due to unspecified occlusion or stenosis of right middle cerebral artery: Secondary | ICD-10-CM | POA: Diagnosis not present

## 2023-10-21 DIAGNOSIS — D696 Thrombocytopenia, unspecified: Secondary | ICD-10-CM | POA: Diagnosis present

## 2023-10-21 DIAGNOSIS — Z6831 Body mass index (BMI) 31.0-31.9, adult: Secondary | ICD-10-CM | POA: Diagnosis not present

## 2023-10-21 DIAGNOSIS — N2581 Secondary hyperparathyroidism of renal origin: Secondary | ICD-10-CM | POA: Diagnosis present

## 2023-10-21 DIAGNOSIS — Z781 Physical restraint status: Secondary | ICD-10-CM | POA: Diagnosis not present

## 2023-10-21 DIAGNOSIS — Z91148 Patient's other noncompliance with medication regimen for other reason: Secondary | ICD-10-CM | POA: Diagnosis not present

## 2023-10-21 DIAGNOSIS — I6389 Other cerebral infarction: Secondary | ICD-10-CM | POA: Diagnosis not present

## 2023-10-21 DIAGNOSIS — T461X6A Underdosing of calcium-channel blockers, initial encounter: Secondary | ICD-10-CM | POA: Diagnosis present

## 2023-10-21 DIAGNOSIS — W19XXXA Unspecified fall, initial encounter: Secondary | ICD-10-CM | POA: Diagnosis not present

## 2023-10-21 DIAGNOSIS — Z992 Dependence on renal dialysis: Secondary | ICD-10-CM | POA: Diagnosis not present

## 2023-10-21 DIAGNOSIS — R569 Unspecified convulsions: Secondary | ICD-10-CM

## 2023-10-21 DIAGNOSIS — I6340 Cerebral infarction due to embolism of unspecified cerebral artery: Secondary | ICD-10-CM | POA: Diagnosis not present

## 2023-10-21 DIAGNOSIS — R29710 NIHSS score 10: Secondary | ICD-10-CM | POA: Diagnosis not present

## 2023-10-21 DIAGNOSIS — F05 Delirium due to known physiological condition: Secondary | ICD-10-CM | POA: Diagnosis present

## 2023-10-21 DIAGNOSIS — F17290 Nicotine dependence, other tobacco product, uncomplicated: Secondary | ICD-10-CM | POA: Diagnosis present

## 2023-10-21 DIAGNOSIS — G9341 Metabolic encephalopathy: Secondary | ICD-10-CM | POA: Diagnosis not present

## 2023-10-21 DIAGNOSIS — Z5901 Sheltered homelessness: Secondary | ICD-10-CM | POA: Diagnosis not present

## 2023-10-21 DIAGNOSIS — T45516A Underdosing of anticoagulants, initial encounter: Secondary | ICD-10-CM | POA: Diagnosis not present

## 2023-10-21 DIAGNOSIS — G40209 Localization-related (focal) (partial) symptomatic epilepsy and epileptic syndromes with complex partial seizures, not intractable, without status epilepticus: Secondary | ICD-10-CM | POA: Diagnosis present

## 2023-10-21 DIAGNOSIS — R760 Raised antibody titer: Secondary | ICD-10-CM | POA: Diagnosis not present

## 2023-10-21 DIAGNOSIS — I639 Cerebral infarction, unspecified: Secondary | ICD-10-CM | POA: Diagnosis not present

## 2023-10-21 DIAGNOSIS — R4182 Altered mental status, unspecified: Secondary | ICD-10-CM | POA: Diagnosis not present

## 2023-10-21 DIAGNOSIS — M329 Systemic lupus erythematosus, unspecified: Secondary | ICD-10-CM | POA: Diagnosis present

## 2023-10-21 DIAGNOSIS — I10 Essential (primary) hypertension: Secondary | ICD-10-CM

## 2023-10-21 DIAGNOSIS — R410 Disorientation, unspecified: Secondary | ICD-10-CM | POA: Diagnosis not present

## 2023-10-21 DIAGNOSIS — N186 End stage renal disease: Secondary | ICD-10-CM

## 2023-10-21 DIAGNOSIS — D6861 Antiphospholipid syndrome: Secondary | ICD-10-CM | POA: Diagnosis present

## 2023-10-21 DIAGNOSIS — I6381 Other cerebral infarction due to occlusion or stenosis of small artery: Secondary | ICD-10-CM | POA: Diagnosis not present

## 2023-10-21 DIAGNOSIS — E66811 Obesity, class 1: Secondary | ICD-10-CM | POA: Diagnosis present

## 2023-10-21 DIAGNOSIS — I953 Hypotension of hemodialysis: Secondary | ICD-10-CM | POA: Diagnosis not present

## 2023-10-21 DIAGNOSIS — R488 Other symbolic dysfunctions: Secondary | ICD-10-CM | POA: Diagnosis present

## 2023-10-21 DIAGNOSIS — I120 Hypertensive chronic kidney disease with stage 5 chronic kidney disease or end stage renal disease: Secondary | ICD-10-CM | POA: Diagnosis present

## 2023-10-21 DIAGNOSIS — I63411 Cerebral infarction due to embolism of right middle cerebral artery: Secondary | ICD-10-CM | POA: Diagnosis not present

## 2023-10-21 DIAGNOSIS — E785 Hyperlipidemia, unspecified: Secondary | ICD-10-CM | POA: Diagnosis present

## 2023-10-21 LAB — CBC WITH DIFFERENTIAL/PLATELET
Abs Immature Granulocytes: 0.02 10*3/uL (ref 0.00–0.07)
Basophils Absolute: 0.1 10*3/uL (ref 0.0–0.1)
Basophils Relative: 1 %
Eosinophils Absolute: 0.3 10*3/uL (ref 0.0–0.5)
Eosinophils Relative: 4 %
HCT: 31.9 % — ABNORMAL LOW (ref 39.0–52.0)
Hemoglobin: 10.3 g/dL — ABNORMAL LOW (ref 13.0–17.0)
Immature Granulocytes: 0 %
Lymphocytes Relative: 14 %
Lymphs Abs: 1 10*3/uL (ref 0.7–4.0)
MCH: 30.7 pg (ref 26.0–34.0)
MCHC: 32.3 g/dL (ref 30.0–36.0)
MCV: 94.9 fL (ref 80.0–100.0)
Monocytes Absolute: 0.8 10*3/uL (ref 0.1–1.0)
Monocytes Relative: 11 %
Neutro Abs: 5.2 10*3/uL (ref 1.7–7.7)
Neutrophils Relative %: 70 %
Platelets: 117 10*3/uL — ABNORMAL LOW (ref 150–400)
RBC: 3.36 MIL/uL — ABNORMAL LOW (ref 4.22–5.81)
RDW: 13.5 % (ref 11.5–15.5)
WBC: 7.3 10*3/uL (ref 4.0–10.5)
nRBC: 0 % (ref 0.0–0.2)

## 2023-10-21 LAB — HEPATIC FUNCTION PANEL
ALT: 12 U/L (ref 0–44)
AST: 16 U/L (ref 15–41)
Albumin: 3.2 g/dL — ABNORMAL LOW (ref 3.5–5.0)
Alkaline Phosphatase: 50 U/L (ref 38–126)
Bilirubin, Direct: <0.1 mg/dL (ref 0.0–0.2)
Total Bilirubin: 0.9 mg/dL (ref 0.0–1.2)
Total Protein: 6.8 g/dL (ref 6.5–8.1)

## 2023-10-21 LAB — BASIC METABOLIC PANEL WITH GFR
Anion gap: 16 — ABNORMAL HIGH (ref 5–15)
BUN: 47 mg/dL — ABNORMAL HIGH (ref 6–20)
CO2: 26 mmol/L (ref 22–32)
Calcium: 9.2 mg/dL (ref 8.9–10.3)
Chloride: 98 mmol/L (ref 98–111)
Creatinine, Ser: 18.76 mg/dL — ABNORMAL HIGH (ref 0.61–1.24)
GFR, Estimated: 3 mL/min — ABNORMAL LOW (ref >60)
Glucose, Bld: 86 mg/dL (ref 70–99)
Potassium: 4.1 mmol/L (ref 3.5–5.1)
Sodium: 140 mmol/L (ref 135–145)

## 2023-10-21 LAB — AMMONIA: Ammonia: 27 umol/L (ref 9–35)

## 2023-10-21 LAB — HEPATITIS B SURFACE ANTIGEN: Hepatitis B Surface Ag: NONREACTIVE

## 2023-10-21 LAB — ETHANOL: Alcohol, Ethyl (B): <15 mg/dL (ref <15)

## 2023-10-21 MED ORDER — SODIUM CHLORIDE 0.9% FLUSH
10.0000 mL | Freq: Two times a day (BID) | INTRAVENOUS | Status: DC
Start: 2023-10-21 — End: 2023-11-12
  Administered 2023-10-22 – 2023-11-12 (×33): 10 mL

## 2023-10-21 MED ORDER — SODIUM CHLORIDE 0.9% FLUSH: 10.0000 mL | INTRAVENOUS | Status: DC | PRN

## 2023-10-21 MED ORDER — CALCITRIOL 0.5 MCG PO CAPS
2.5000 ug | ORAL_CAPSULE | ORAL | Status: DC
  Administered 2023-10-23 – 2023-11-11 (×8): 2.5 ug via ORAL
  Filled 2023-10-21 (×9): qty 5

## 2023-10-21 MED ORDER — SEVELAMER CARBONATE 800 MG PO TABS
3200.0000 mg | ORAL_TABLET | Freq: Three times a day (TID) | ORAL | Status: DC
  Administered 2023-10-26 – 2023-11-12 (×41): 3200 mg via ORAL
  Filled 2023-10-21 (×47): qty 4

## 2023-10-21 MED ORDER — HYDRALAZINE HCL 20 MG/ML IJ SOLN
5.0000 mg | INTRAMUSCULAR | Status: AC
  Administered 2023-10-21: 5 mg via INTRAVENOUS
  Filled 2023-10-21: qty 1

## 2023-10-21 MED ORDER — CINACALCET HCL 30 MG PO TABS
150.0000 mg | ORAL_TABLET | ORAL | Status: DC
  Administered 2023-10-23 – 2023-11-11 (×8): 150 mg via ORAL
  Filled 2023-10-21 (×9): qty 5

## 2023-10-21 MED ORDER — LEVETIRACETAM 500 MG PO TABS
500.0000 mg | ORAL_TABLET | Freq: Two times a day (BID) | ORAL | Status: DC
  Administered 2023-10-21 – 2023-11-12 (×42): 500 mg via ORAL
  Filled 2023-10-21 (×43): qty 1

## 2023-10-21 MED ORDER — LORAZEPAM 2 MG/ML IJ SOLN
2.0000 mg | Freq: Once | INTRAMUSCULAR | Status: AC
  Administered 2023-10-21: 2 mg via INTRAMUSCULAR
  Filled 2023-10-21: qty 1

## 2023-10-21 MED ORDER — LORAZEPAM 2 MG/ML IJ SOLN
0.5000 mg | Freq: Once | INTRAMUSCULAR | Status: AC | PRN
  Administered 2023-10-21: 0.5 mg via INTRAVENOUS
  Filled 2023-10-21: qty 1

## 2023-10-21 MED ORDER — ALPRAZOLAM 0.5 MG PO TABS: 0.2500 mg | ORAL_TABLET | Freq: Once | ORAL | Status: DC | PRN

## 2023-10-21 MED ORDER — HYDRALAZINE HCL 20 MG/ML IJ SOLN
5.0000 mg | Freq: Four times a day (QID) | INTRAMUSCULAR | Status: DC | PRN
  Administered 2023-10-22 (×2): 5 mg via INTRAVENOUS
  Filled 2023-10-21 (×2): qty 1

## 2023-10-21 MED ORDER — CARVEDILOL 6.25 MG PO TABS
6.2500 mg | ORAL_TABLET | Freq: Two times a day (BID) | ORAL | Status: DC
  Administered 2023-10-21 – 2023-10-27 (×8): 6.25 mg via ORAL
  Filled 2023-10-21 (×10): qty 1

## 2023-10-21 MED ORDER — APIXABAN 2.5 MG PO TABS
2.5000 mg | ORAL_TABLET | Freq: Two times a day (BID) | ORAL | Status: DC
Start: 2023-10-21 — End: 2023-10-27
  Administered 2023-10-21 – 2023-10-27 (×12): 2.5 mg via ORAL
  Filled 2023-10-21 (×12): qty 1

## 2023-10-21 MED ORDER — AMLODIPINE BESYLATE 5 MG PO TABS
10.0000 mg | ORAL_TABLET | Freq: Every day | ORAL | Status: DC
  Administered 2023-10-22 – 2023-10-25 (×4): 10 mg via ORAL
  Filled 2023-10-21: qty 1
  Filled 2023-10-21: qty 2
  Filled 2023-10-21: qty 1
  Filled 2023-10-21: qty 2
  Filled 2023-10-21 (×2): qty 1

## 2023-10-21 MED ORDER — ATORVASTATIN CALCIUM 40 MG PO TABS
40.0000 mg | ORAL_TABLET | Freq: Every day | ORAL | Status: DC
  Administered 2023-10-21 – 2023-11-12 (×20): 40 mg via ORAL
  Filled 2023-10-21 (×22): qty 1

## 2023-10-21 MED ORDER — LORAZEPAM 2 MG/ML IJ SOLN
1.0000 mg | Freq: Once | INTRAMUSCULAR | Status: AC
  Administered 2023-10-21: 1 mg via INTRAMUSCULAR
  Filled 2023-10-21: qty 1

## 2023-10-21 MED ORDER — RENA-VITE PO TABS
1.0000 | ORAL_TABLET | Freq: Every day | ORAL | Status: DC
  Administered 2023-10-22 – 2023-11-11 (×20): 1 via ORAL
  Filled 2023-10-21 (×21): qty 1

## 2023-10-21 NOTE — ED Notes (Signed)
 Patient moving to 5M15 IVC will be sent up with patient

## 2023-10-21 NOTE — Consult Note (Signed)
 [facility] Renal Consultation Note    Indication for Consultation:  Management of ESRD/hemodialysis; anemia, hypertension/volume and secondary hyperparathyroidism  ZOX:WRUEAVW, Maile Score, NP  HPI: [name] [name] is a 43 y.o. male with ESRD on HD MWF at [facility]. His past medical history is significant for hypertension, history of stroke, seizure disorder, and lupus anticoagulant.   Reviewed previous notes. It appears he had a seizure on 10/18/23 (witnessed by his roommate) then another seizure later that day. He was then brought to the ED but he signed out AMA. Today, he was brought into the ED again after having another seizure. He is currently on IVC status. Seen and examined him in his room. He is not engaging with me. He states: I had a seizure somewhere else" and doesn't provide me with further details. Appears his last HD on was 5/3 where he received full treatment. He missed HD on 5/2 2nd to the seizures. He is nowhere near his EDW and usually advised to monitor his fluids. Current renal labs are stable and he remains on RA. He is not in acute respiratory distress. Plan to resume HD today per his usual schedule.   Past Medical History:  Diagnosis Date   ESRD on hemodialysis (HCC)    Hypertension    Lupus    Seizures (HCC)    Seizures (HCC)    Stroke (HCC)    42 or 43 years of age.     Wears glasses    Past Surgical History:  Procedure Laterality Date   AV FISTULA PLACEMENT Left 12/30/2017   Procedure: ARTERIOVENOUS (AV) FISTULA CREATION VERSUS INSERTION OF ARTERIOVENOUS GRAFT LEFT ARM;  Surgeon: Mayo Speck, MD;  Location: [facility] OR;  Service: Vascular;  Laterality: Left;   BASCILIC VEIN TRANSPOSITION Left 08/15/2018   Procedure: LEFT FIRST STAGE BASCILIC VEIN TRANSPOSITION;  Surgeon: Mayo Speck, MD;  Location: [facility] OR;  Service: Vascular;  Laterality: Left;   BASCILIC VEIN TRANSPOSITION Left 10/30/2018   Procedure: INSERTION OF GORE-TEX GRAFT LEFT UPPER  ARM;  Surgeon: Adine Hoof, MD;  Location: [facility] OR;  Service: Vascular;  Laterality: Left;   INSERTION OF DIALYSIS CATHETER N/A 09/20/2018   Procedure: INSERTION OF TUNNELED DIALYSIS CATHETER RIGHT INTERNAL JUGULAR;  Surgeon: Young Hensen, MD;  Location: [facility] OR;  Service: Vascular;  Laterality: N/A;   IR FLUORO GUIDE CV LINE RIGHT  07/16/2022   IR US  GUIDE VASC ACCESS RIGHT  07/16/2022   RENAL BIOPSY     Family History  Problem Relation Age of Onset   Diabetes Mother    Diabetes Maternal Grandmother    Seizures Neg Hx        not on mother's side. he doesn't know about his father's side   Social History:  reports that he has been smoking e-cigarettes. He has never used smokeless tobacco. He reports that he does not currently use alcohol. He reports that he does not use drugs. Allergies  Allergen Reactions   Chlorhexidine  Itching   Cetirizine & Related Swelling   Prior to Admission medications   Medication Sig Start Date End Date Taking? Authorizing Provider  acetaminophen  (TYLENOL ) 325 MG tablet Take 2 tablets (650 mg total) by mouth every 6 (six) hours as needed for mild pain. 02/13/23  Yes Abbe Abate, MD  amLODipine  (NORVASC ) 10 MG tablet Take 10 mg by mouth at bedtime. 06/03/23  Yes [provider]  atorvastatin  (LIPITOR) 40 MG tablet Take 1 tablet (40 mg total) by  mouth daily. 08/03/23  Yes Aisha Hove, MD  AURYXIA  1 GM 210 MG(Fe) tablet Take 840 mg by mouth 3 (three) times daily. 10/07/23  Yes [provider]  carvedilol  (COREG ) 6.25 MG tablet Take 1 tablet (6.25 mg total) by mouth 2 (two) times daily with a meal. 08/03/23  Yes Sreeram, Sundra Engel, MD  levETIRAcetam  (KEPPRA ) 500 MG tablet Take 2 tablets (1,000 mg total) by mouth 2 (two) times daily. 09/25/23 10/25/23 Yes Onetha Bile, MD  multivitamin (RENA-VIT) TABS tablet Take 1 tablet by mouth daily.   Yes [provider]  sevelamer  carbonate (RENVELA ) 800 MG tablet Take  3,200 mg by mouth 3 (three) times daily with meals.   Yes [provider]  apixaban  (ELIQUIS ) 2.5 MG TABS tablet Take 2.5 mg by mouth 2 (two) times daily.    [provider]   Current Facility-Administered Medications  Medication Dose Route Frequency Provider Last Rate Last Admin   amLODipine  (NORVASC ) tablet 10 mg  10 mg Oral QHS Angelene Kelly, MD       apixaban  (ELIQUIS ) tablet 2.5 mg  2.5 mg Oral BID Angelene Kelly, MD   2.5 mg at 10/21/23 1041   atorvastatin  (LIPITOR) tablet 40 mg  40 mg Oral Daily Angelene Kelly, MD   40 mg at 10/21/23 1041   carvedilol  (COREG ) tablet 6.25 mg  6.25 mg Oral BID WC Angelene Kelly, MD   6.25 mg at 10/21/23 1041   levETIRAcetam  (KEPPRA ) tablet 500 mg  500 mg Oral BID Angelene Kelly, MD   500 mg at 10/21/23 1041   multivitamin (RENA-VIT) tablet 1 tablet  1 tablet Oral QHS Angelene Kelly, MD       sevelamer  carbonate (RENVELA ) tablet 3,200 mg  3,200 mg Oral TID WC Angelene Kelly, MD       Labs: Basic Metabolic Panel: Recent Labs  Lab 10/18/23 0803 10/20/23 1750  NA 139 141  K 4.4 3.8  CL 102 99  CO2 23 26  GLUCOSE 90 93  BUN 43* 41*  CREATININE 16.43* 16.18*  CALCIUM  8.7* 9.0   Liver Function Tests: Recent Labs  Lab 10/18/23 0803 10/20/23 1750  AST 16 18  ALT 12 13  ALKPHOS 59 60  BILITOT 0.7 1.0  PROT 6.8 7.5  ALBUMIN  3.4* 3.5   No results for input(s): "LIPASE", "AMYLASE" in the last 168 hours. Recent Labs  Lab 10/21/23 0415  AMMONIA 27   CBC: Recent Labs  Lab 10/18/23 0803 10/20/23 1750  WBC 8.5 7.6  NEUTROABS 6.7 5.4  HGB 10.6* 11.1*  HCT 32.4* 33.7*  MCV 94.7 93.9  PLT 92* 110*   Cardiac Enzymes: No results for input(s): "CKTOTAL", "CKMB", "CKMBINDEX", "TROPONINI" in the last 168 hours. CBG: Recent Labs  Lab 10/18/23 0756 10/20/23 1735 10/20/23 1945 10/20/23 2042  GLUCAP 75 89 68* 92   Iron  Studies: No results for input(s): "IRON ", "TIBC",  "TRANSFERRIN", "FERRITIN" in the last 72 hours. Studies/Results: EEG adult Result Date: 10/21/2023 Arleene Lack, MD     10/21/2023 11:47 AM Patient Name: [name] [name] MRN: [id number] Epilepsy Attending: Arleene Lack Referring Physician/Provider: Angelene Kelly, MD Date:  10/21/2023 Duration: 23.18 mins Patient history:  43 y.o. male with history of ESRD on hemodialysis, hypertension, prior stroke, history of seizures, lupus anticoagulant was brought to the ER after patient had a seizure.  EEG to evaluate for seizure. Level of alertness: Awake, asleep AEDs during EEG study: None Technical aspects: This EEG  study was done with scalp electrodes positioned according to the 10-20 International system of electrode placement. Electrical activity was reviewed with band pass filter of 1-70Hz , sensitivity of 7 uV/mm, display speed of 3mm/sec with a 60Hz  notched filter applied as appropriate. EEG data were recorded continuously and digitally stored.  Video monitoring was available and reviewed as appropriate. Description: During awake state, no clear posterior dominant rhythm was seen.  Sleep was characterized by sleep spindles (13 to 15 Hz), maximal frontocentral region.  EEG showed continuous generalized and at times rhythmic 3 to 6 Hz theta-delta slowing.  Sharp transients were noted in right frontal region. Hyperventilation and photic stimulation were not performed.   ABNORMALITY - Continuous slow, generalized IMPRESSION: This study is suggestive of moderate diffuse encephalopathy.  Sharp transients were noted in the right frontal region.  Because EEG showed rhythmic slowing as well as sharp transients, would recommend long-term EEG monitoring for further evaluation. Arleene Lack   CT Head Wo Contrast Result Date: 10/20/2023 CLINICAL DATA:  Seizure disorder, or altered mental status EXAM: CT HEAD WITHOUT CONTRAST TECHNIQUE: Contiguous axial images were obtained from the base of the skull through the  vertex without intravenous contrast. RADIATION DOSE REDUCTION: This exam was performed according to the departmental dose-optimization program which includes automated exposure control, adjustment of the mA and/or kV according to patient size and/or use of iterative reconstruction technique. COMPARISON:  CT head 08/01/2023 FINDINGS: Brain: No intracranial hemorrhage, mass effect, or evidence of acute infarct. No hydrocephalus. No extra-axial fluid collection. Age-commensurate cerebral atrophy and chronic small vessel ischemic disease. Chronic left occipital, left insular, left parietal, left cerebellar, and right frontal infarcts. Vascular: No hyperdense vessel. Intracranial arterial calcification. Skull: No fracture or focal lesion. Sinuses/Orbits: No acute finding. Other: None. IMPRESSION: 1. No acute intracranial abnormality.  Multiple chronic infarcts. Electronically Signed   By: Rozell Cornet M.D.   On: 10/20/2023 20:24    ROS: All others negative except those listed in HPI.  Physical Exam: Vitals:   10/21/23 0336 10/21/23 0339 10/21/23 0745 10/21/23 1038  BP: (!) 190/143  125/85 (!) 168/100  Pulse: 94  82 89  Resp: 18  18 18   Temp:  98.1 F (36.7 C)  98.6 F (37 C)  TempSrc:    Axillary  SpO2: 100%  100% 98%  Weight:    127.2 kg  Height:    6' 0.01" (1.829 m)     General: WDWN NAD Head: Sclera not icteric Lungs: Diminished BL lower bases and clear in uppers; No wheeze, rales or rhonchi. Breathing is unlabored. Heart: RRR. No murmur, rubs or gallops.  Abdomen: soft, nontender, +BS, no guarding, no tenderness Lower extremities: 1+ b/l lower extremity edema Neuro: AAOx3. Moves all extremities spontaneously. Psych:  Not engaging with me currently Dialysis Access: R [facility]  Dialysis Orders:  MWF - [facility] 4hrs, BFR 400, DFR 800,  EDW 110.5kg, 2K/ 2Ca Heparin : No bolus ordered Mircera: None ordered Calcitriol  2.5mcg PO qHD - last dose 10/19/23 Sensipar  150mg  with HD -  last dose 10/19/23  Last Labs: Hgb 11.1, K 3.8, Ca 9.0, Alb 3.5  Assessment/Plan: Seizure disorder - Neurology following; EEG completed and MRI brain ordered. Received loading dose of IV Keppra . ESRD -  on HD MWF. Plan for HD today per his usual schedule. UFG set 3-4L as tolerated Hypertension/volume  - Blood pressures mainly up likely 2nd hypervolemic. He's nowhere near his EDW in outpatient and occasionally refuses extra treatments. Push UF here as tolerated.  Resume home BP meds. Anemia of CKD - Current Hgb at goal. No Fe/ESA is indicated at this time. Secondary Hyperparathyroidism -  Resume Calcitriol  and Sensipar  Nutrition - Renal diet with fluid restriction  Jadene Maxwell, NP [facility] 10/21/2023, 12:29 PM

## 2023-10-21 NOTE — Plan of Care (Signed)

## 2023-10-21 NOTE — Plan of Care (Signed)
 PLAN OF CARE NOTE  43 y.o. male with history of ESRD on hemodialysis MWF, hypertension, prior stroke, history of seizures, lupus anticoagulant was brought to the ER  this morning after patient had a seizure.  Patient's friend was at the bedside and his roommate states that patient had a seizure on Oct 18, 2023 and he missed his dialysis due to that.  Later in the evening he had another seizure.  He did go to his dialysis on Saturday the following day.  But due to increased confusion patient was brought to the ER.  Initially in the ER patient signed out AMA but on the way out patient was confused and [facility] Department brought him back.  Per patient's friend patient last took his medication on Oct 17, 2023.   The patient has been seen by neurology and nephrology.  He he has had an EEG is suggestive of moderate diffuse encephalopathy.  Sharp transients were noted in the right frontal region.  Per neurology, "because EEG showed rhythmic slowing as well as sharp transients, would recommend long-term EEG monitoring for further evaluation."  I saw and evaluated the patient at bedside. He is confused but responsive.  - Follow up MRI.  - Nephrology consulted to continue dialysis.   MDALA-GAUSI, Tegan Burnside AGATHA  10/21/23 3:24 PM

## 2023-10-21 NOTE — Progress Notes (Signed)
 Pt receives out-pt HD at [facility] on MWF 7:15 am chair time. Will assist as needed.   Lauraine Polite Renal Navigator [phone]

## 2023-10-21 NOTE — Progress Notes (Signed)
 IVT consult placed in ED - patient being admitted for assessment. Dialysis patient. No IV meds ordered at this time, including PRN. Patient had 3 PIV's placed in the ED previously but removed them all due to confusion. At this time, IV isn't indicated with no IV medications ordered. Will continue to monitor and reach out to unit RN once assigned.   Ahsley Attwood R Betha Shadix, RN

## 2023-10-21 NOTE — Progress Notes (Signed)
   10/21/23 1830  Vitals  Temp 97.7 F (36.5 C)  Pulse Rate 77  Resp (!) 21  BP (!) 189/115  SpO2 94 %  O2 Device Room Air  Weight 116.5 kg  Type of Weight Post-Dialysis  Post Treatment  Dialyzer Clearance Lightly streaked  Hemodialysis Intake (mL) 0 mL  Liters Processed 68.6  Fluid Removed (mL) 2800 mL  Tolerated HD Treatment Yes  Post-Hemodialysis Comments Pt treatment ended early due to pt agitation and unable to remain still without hemodialysis machine alarming.   Received patient in bed to unit.  Alert and oriented.  Informed consent signed and in chart.   TX duration: Three hours  Patient tolerated well.  Transported back to the room  Alert, without acute distress.  Hand-off given to patient's nurse.   Access used: Right chest HD catheter Access issues: None  Medication(s) given: Coreg  6.25mg .

## 2023-10-21 NOTE — ED Notes (Signed)
 Patient will not leave his leads on.

## 2023-10-21 NOTE — ED Notes (Signed)
 IVC expires 10/28/23

## 2023-10-21 NOTE — ED Notes (Signed)
 Patient continues to get up and walk around, recliner placed in patient room currently resting in recliner.

## 2023-10-21 NOTE — Progress Notes (Signed)
 Patient refusing to take his medications. Yelling 'NO!."

## 2023-10-21 NOTE — Progress Notes (Signed)
   10/21/23 1730  Vitals  Resp 17  BP (!) 201/136  During Treatment Monitoring  Blood Flow Rate (mL/min) 0 mL/min  Arterial Pressure (mmHg) -17.57 mmHg  Venous Pressure (mmHg) 48.89 mmHg  TMP (mmHg) 29.89 mmHg  Ultrafiltration Rate (mL/min) 1541 mL/min  Dialysate Flow Rate (mL/min) 300 ml/min  Duration of HD Treatment -hour(s) 2.71 hour(s)  Cumulative Fluid Removed (mL) per Treatment  2880.25  Intra-Hemodialysis Comments See progress note (Pt dialyzer tmp pressure elevated, rinsed back and new set-up given.)

## 2023-10-21 NOTE — ED Notes (Signed)
 Floor called to make aware patient was coming, IVC papers sent with patient

## 2023-10-21 NOTE — ED Notes (Signed)
 Pt at bedside, roaming and

## 2023-10-21 NOTE — ED Notes (Signed)
 Pt awake, A&Ox4, w/ no signs of distress at this time. Pt reporting that he is ready to go home. Pt removed his own IV. Lauren, PA notified.

## 2023-10-21 NOTE — H&P (Signed)
 History and Physical    [name] [name] ZOX:[id number] DOB: August 08, 1980 DOA: 10/20/2023  Patient coming from: Brought in by [facility].  Chief Complaint: Confusion.  HPI: [name] [name] is a 43 y.o. male with history of ESRD on hemodialysis, hypertension, prior stroke, history of seizures, lupus anticoagulant was brought to the ER after patient had a seizure.  Patient's friend was at the bedside and his roommate states that patient had a seizure on Oct 18, 2023 and he missed his dialysis due to that.  Later in the evening he had another seizure.  He did go to his dialysis on Saturday the following day.  But due to increased confusion patient was brought to the ER.  Initially in the ER patient signed out AMA but on the way out patient was confused and [facility] Department brought him back.  Per patient's friend patient last took his medication on Oct 17, 2023.  ED Course: In the ER patient's blood pressure was elevated and patient appeared confused.  CT head did not show anything acute.  Patient was given Keppra  loading dose of 2 g and since patient remained confused patient was involuntary committed.  Patient labs show hemoglobin of 11.1 WBC of 7.6 potassium of 3.8 creatinine of 16.1.  Ammonia of 27.  Patient admitted for further observation.  Review of Systems: As per HPI, rest all negative.   Past Medical History:  Diagnosis Date   ESRD on hemodialysis (HCC)    Hypertension    Lupus    Seizures (HCC)    Seizures (HCC)    Stroke (HCC)    32 or 43 years of age.     Wears glasses     Past Surgical History:  Procedure Laterality Date   AV FISTULA PLACEMENT Left 12/30/2017   Procedure: ARTERIOVENOUS (AV) FISTULA CREATION VERSUS INSERTION OF ARTERIOVENOUS GRAFT LEFT ARM;  Surgeon: Mayo Speck, MD;  Location: [facility] OR;  Service: Vascular;  Laterality: Left;   BASCILIC VEIN TRANSPOSITION Left 08/15/2018   Procedure: LEFT FIRST STAGE BASCILIC VEIN TRANSPOSITION;  Surgeon:  Mayo Speck, MD;  Location: [facility] OR;  Service: Vascular;  Laterality: Left;   BASCILIC VEIN TRANSPOSITION Left 10/30/2018   Procedure: INSERTION OF GORE-TEX GRAFT LEFT UPPER ARM;  Surgeon: Adine Hoof, MD;  Location: [facility] OR;  Service: Vascular;  Laterality: Left;   INSERTION OF DIALYSIS CATHETER N/A 09/20/2018   Procedure: INSERTION OF TUNNELED DIALYSIS CATHETER RIGHT INTERNAL JUGULAR;  Surgeon: Young Hensen, MD;  Location: [facility] OR;  Service: Vascular;  Laterality: N/A;   IR FLUORO GUIDE CV LINE RIGHT  07/16/2022   IR US  GUIDE VASC ACCESS RIGHT  07/16/2022   RENAL BIOPSY       reports that he has been smoking e-cigarettes. He has never used smokeless tobacco. He reports that he does not currently use alcohol. He reports that he does not use drugs.  Allergies  Allergen Reactions   Chlorhexidine  Itching   Cetirizine & Related Swelling    Family History  Problem Relation Age of Onset   Diabetes Mother    Diabetes Maternal Grandmother    Seizures Neg Hx        not on mother's side. he doesn't know about his father's side    Prior to Admission medications   Medication Sig Start Date End Date Taking? Authorizing Provider  acetaminophen  (TYLENOL ) 325 MG tablet Take 2 tablets (650 mg total) by mouth every 6 (six) hours as needed for mild pain.  02/13/23  Yes Abbe Abate, MD  amLODipine  (NORVASC ) 10 MG tablet Take 10 mg by mouth at bedtime. 06/03/23  Yes [provider]  atorvastatin  (LIPITOR) 40 MG tablet Take 1 tablet (40 mg total) by mouth daily. 08/03/23  Yes Aisha Hove, MD  AURYXIA  1 GM 210 MG(Fe) tablet Take 840 mg by mouth 3 (three) times daily. 10/07/23  Yes [provider]  carvedilol  (COREG ) 6.25 MG tablet Take 1 tablet (6.25 mg total) by mouth 2 (two) times daily with a meal. 08/03/23  Yes Aisha Hove, MD  levETIRAcetam  (KEPPRA ) 500 MG tablet Take 2 tablets (1,000 mg total) by mouth 2 (two) times daily. 09/25/23 10/25/23 Yes  Onetha Bile, MD  multivitamin (RENA-VIT) TABS tablet Take 1 tablet by mouth daily.   Yes [provider]  sevelamer  carbonate (RENVELA ) 800 MG tablet Take 3,200 mg by mouth 3 (three) times daily with meals.   Yes [provider]  apixaban  (ELIQUIS ) 2.5 MG TABS tablet Take 2.5 mg by mouth 2 (two) times daily.    [provider]    Physical Exam: Constitutional: Moderately built and nourished. Vitals:   10/20/23 1845 10/20/23 2309 10/21/23 0336 10/21/23 0339  BP: (!) 172/130 (!) 162/121 (!) 190/143   Pulse: 91 90 94   Resp: (!) 21 20 18    Temp:    98.1 F (36.7 C)  SpO2: 99% 100% 100%    Eyes: Anicteric no pallor. ENMT: No discharge from the ears eyes nose normal. Neck: No mass felt.  No neck rigidity. Respiratory: No rhonchi or crepitations. Cardiovascular: S1-S2 heard. Abdomen: Soft nontender bowel sound present. Musculoskeletal: Bilateral lower extremity edema present. Skin: No rash. Neurologic: Alert awake oriented to his name and place moving all extremities. Psychiatric: Oriented to name and place.   Labs on Admission: I have personally reviewed following labs and imaging studies  CBC: Recent Labs  Lab 10/18/23 0803 10/20/23 1750  WBC 8.5 7.6  NEUTROABS 6.7 5.4  HGB 10.6* 11.1*  HCT 32.4* 33.7*  MCV 94.7 93.9  PLT 92* 110*   Basic Metabolic Panel: Recent Labs  Lab 10/18/23 0803 10/20/23 1750  NA 139 141  K 4.4 3.8  CL 102 99  CO2 23 26  GLUCOSE 90 93  BUN 43* 41*  CREATININE 16.43* 16.18*  CALCIUM  8.7* 9.0   GFR: Estimated Creatinine Clearance: 8 mL/min (A) (by C-G formula based on SCr of 16.18 mg/dL (H)). Liver Function Tests: Recent Labs  Lab 10/18/23 0803 10/20/23 1750  AST 16 18  ALT 12 13  ALKPHOS 59 60  BILITOT 0.7 1.0  PROT 6.8 7.5  ALBUMIN  3.4* 3.5   No results for input(s): "LIPASE", "AMYLASE" in the last 168 hours. Recent Labs  Lab 10/21/23 0415  AMMONIA 27   Coagulation Profile: No results  for input(s): "INR", "PROTIME" in the last 168 hours. Cardiac Enzymes: No results for input(s): "CKTOTAL", "CKMB", "CKMBINDEX", "TROPONINI" in the last 168 hours. BNP (last 3 results) No results for input(s): "PROBNP" in the last 8760 hours. HbA1C: No results for input(s): "HGBA1C" in the last 72 hours. CBG: Recent Labs  Lab 10/18/23 0756 10/20/23 1735 10/20/23 1945 10/20/23 2042  GLUCAP 75 89 68* 92   Lipid Profile: No results for input(s): "CHOL", "HDL", "LDLCALC", "TRIG", "CHOLHDL", "LDLDIRECT" in the last 72 hours. Thyroid Function Tests: No results for input(s): "TSH", "T4TOTAL", "FREET4", "T3FREE", "THYROIDAB" in the last 72 hours. Anemia Panel: No results for input(s): "VITAMINB12", "FOLATE", "FERRITIN", "TIBC", "IRON ", "RETICCTPCT" in the  last 72 hours. Urine analysis:    Component Value Date/Time   COLORURINE YELLOW 04/28/2023 1803   APPEARANCEUR TURBID (A) 04/28/2023 1803   LABSPEC 1.014 04/28/2023 1803   PHURINE 8.0 04/28/2023 1803   GLUCOSEU NEGATIVE 04/28/2023 1803   HGBUR MODERATE (A) 04/28/2023 1803   BILIRUBINUR NEGATIVE 04/28/2023 1803   KETONESUR NEGATIVE 04/28/2023 1803   PROTEINUR 100 (A) 04/28/2023 1803   UROBILINOGEN 0.2 05/09/2013 1857   NITRITE NEGATIVE 04/28/2023 1803   LEUKOCYTESUR MODERATE (A) 04/28/2023 1803   Sepsis Labs: @LABRCNTIP (procalcitonin:4,lacticidven:4) )No results found for this or any previous visit (from the past 240 hours).   Radiological Exams on Admission: CT Head Wo Contrast Result Date: 10/20/2023 CLINICAL DATA:  Seizure disorder, or altered mental status EXAM: CT HEAD WITHOUT CONTRAST TECHNIQUE: Contiguous axial images were obtained from the base of the skull through the vertex without intravenous contrast. RADIATION DOSE REDUCTION: This exam was performed according to the departmental dose-optimization program which includes automated exposure control, adjustment of the mA and/or kV according to patient size and/or use of  iterative reconstruction technique. COMPARISON:  CT head 08/01/2023 FINDINGS: Brain: No intracranial hemorrhage, mass effect, or evidence of acute infarct. No hydrocephalus. No extra-axial fluid collection. Age-commensurate cerebral atrophy and chronic small vessel ischemic disease. Chronic left occipital, left insular, left parietal, left cerebellar, and right frontal infarcts. Vascular: No hyperdense vessel. Intracranial arterial calcification. Skull: No fracture or focal lesion. Sinuses/Orbits: No acute finding. Other: None. IMPRESSION: 1. No acute intracranial abnormality.  Multiple chronic infarcts. Electronically Signed   By: Rozell Cornet M.D.   On: 10/20/2023 20:24     Assessment/Plan Principal Problem:   Acute encephalopathy Active Problems:   Thrombocytopenia (HCC)   Lupus anticoagulant positive   HTN (hypertension)   Seizures (HCC)   Anemia in chronic kidney disease   ESRD on hemodialysis (HCC)   History of stroke    Acute encephalopathy likely postictal but improving at this time.  Patient was given loading dose of Keppra  2 g.  Discussed with on-call neurologist Dr. Aura Bloomer.  Neurologist advised given the history of prior stroke and lupus anticoagulant recommended getting MRI brain and EEG.  To keep patient on Keppra  500 mg twice daily given ESRD status.  Will keep patient on seizure precautions. ESRD on hemodialysis gets dialyzed on Monday Wednesday and Friday.  He did get dialyzed on Saturday 2 days ago, due to missing dialysis on Friday.  Consult nephrology for dialysis. History of stroke and lupus anticoagulant on Eliquis  and statins. Hypertension uncontrolled likely from not taking his medication last 2 days.  Continue with amlodipine  and Coreg .  Follow blood pressure trends. Anemia likely from renal disease follow CBC. Thrombocytopenia appears to be chronic follow CBC.  Since patient has acute encephalopathy with history of seizures and prior stroke will need further  workup and more than 2 midnight stay.  Patient was involuntarily committed.   DVT prophylaxis: Eliquis . Code Status: Full code. Family Communication: Patient's friend and roommate at bedside. Disposition Plan: Monitored bed. Consults called: Discussed with neurologist. Admission status: Observation.

## 2023-10-21 NOTE — Progress Notes (Signed)
 Patient removed all EEG leads. Tech attempted to rehook him, but he was already gone to dialysis. EEG will be rehooked up when patient is available.

## 2023-10-21 NOTE — Progress Notes (Signed)
 EEG complete - results pending

## 2023-10-21 NOTE — Progress Notes (Signed)
 LTM EEG hooked up and running - no initial skin breakdown - push button tested - [facility] monitoring.

## 2023-10-21 NOTE — ED Notes (Signed)
 IVC paperwork has been e-filed. Envelope #[id number]

## 2023-10-21 NOTE — Procedures (Signed)
 Patient Name: CHAUNCEY AHLGREN  MRN: [id number]  Epilepsy Attending: Arleene Lack  Referring Physician/Provider: Angelene Kelly, MD  Date:  10/21/2023 Duration: 23.18 mins  Patient history:  43 y.o. male with history of ESRD on hemodialysis, hypertension, prior stroke, history of seizures, lupus anticoagulant was brought to the ER after patient had a seizure.  EEG to evaluate for seizure.  Level of alertness: Awake, asleep  AEDs during EEG study: None  Technical aspects: This EEG study was done with scalp electrodes positioned according to the 10-20 International system of electrode placement. Electrical activity was reviewed with band pass filter of 1-70Hz , sensitivity of 7 uV/mm, display speed of 14mm/sec with a 60Hz  notched filter applied as appropriate. EEG data were recorded continuously and digitally stored.  Video monitoring was available and reviewed as appropriate.  Description: During awake state, no clear posterior dominant rhythm was seen.  Sleep was characterized by sleep spindles (13 to 15 Hz), maximal frontocentral region.  EEG showed continuous generalized and at times rhythmic 3 to 6 Hz theta-delta slowing.  Sharp transients were noted in right frontal region. Hyperventilation and photic stimulation were not performed.     ABNORMALITY - Continuous slow, generalized  IMPRESSION: This study is suggestive of moderate diffuse encephalopathy.  Sharp transients were noted in the right frontal region.    Because EEG showed rhythmic slowing as well as sharp transients, would recommend long-term EEG monitoring for further evaluation.  Grey Schlauch O Pattie Flaharty

## 2023-10-21 NOTE — ED Notes (Signed)
 Pt dressed in burgundy scrubs. Pt wanded by security. Clothing placed in locker #6. Per Ecologist, pt allowed to keep cell phone as they are medical IVC.

## 2023-10-22 ENCOUNTER — Encounter (HOSPITAL_COMMUNITY): Payer: Self-pay

## 2023-10-22 ENCOUNTER — Inpatient Hospital Stay (HOSPITAL_COMMUNITY)

## 2023-10-22 ENCOUNTER — Encounter (HOSPITAL_COMMUNITY)

## 2023-10-22 DIAGNOSIS — I120 Hypertensive chronic kidney disease with stage 5 chronic kidney disease or end stage renal disease: Secondary | ICD-10-CM

## 2023-10-22 DIAGNOSIS — R410 Disorientation, unspecified: Secondary | ICD-10-CM | POA: Diagnosis not present

## 2023-10-22 DIAGNOSIS — G9340 Encephalopathy, unspecified: Secondary | ICD-10-CM | POA: Diagnosis not present

## 2023-10-22 DIAGNOSIS — Z91148 Patient's other noncompliance with medication regimen for other reason: Secondary | ICD-10-CM

## 2023-10-22 DIAGNOSIS — N186 End stage renal disease: Secondary | ICD-10-CM | POA: Diagnosis not present

## 2023-10-22 DIAGNOSIS — R569 Unspecified convulsions: Secondary | ICD-10-CM | POA: Diagnosis not present

## 2023-10-22 LAB — HEPATITIS B SURFACE ANTIBODY, QUANTITATIVE: Hep B S AB Quant (Post): 2789 m[IU]/mL

## 2023-10-22 LAB — TSH: TSH: 0.995 u[IU]/mL (ref 0.350–4.500)

## 2023-10-22 LAB — HIV ANTIBODY (ROUTINE TESTING W REFLEX): HIV Screen 4th Generation wRfx: NONREACTIVE

## 2023-10-22 LAB — VITAMIN B12: Vitamin B-12: 539 pg/mL (ref 180–914)

## 2023-10-22 LAB — LEVETIRACETAM LEVEL: Levetiracetam Lvl: 47.4 ug/mL — ABNORMAL HIGH (ref 10.0–40.0)

## 2023-10-22 LAB — GLUCOSE, CAPILLARY: Glucose-Capillary: 94 mg/dL (ref 70–99)

## 2023-10-22 LAB — FOLATE: Folate: 17.5 ng/mL (ref >5.9)

## 2023-10-22 MED ORDER — LORAZEPAM 2 MG/ML IJ SOLN
0.5000 mg | INTRAMUSCULAR | Status: AC
Start: 2023-10-22 — End: 2023-10-22
  Administered 2023-10-22: 0.5 mg via INTRAVENOUS
  Filled 2023-10-22: qty 1

## 2023-10-22 MED ORDER — THIAMINE MONONITRATE 100 MG PO TABS
100.0000 mg | ORAL_TABLET | Freq: Every day | ORAL | Status: DC
  Administered 2023-10-22 – 2023-10-23 (×2): 100 mg via ORAL
  Filled 2023-10-22 (×4): qty 1

## 2023-10-22 MED ORDER — THIAMINE HCL 100 MG/ML IJ SOLN
100.0000 mg | Freq: Every day | INTRAMUSCULAR | Status: DC
  Filled 2023-10-22 (×2): qty 2

## 2023-10-22 NOTE — Progress Notes (Signed)
 Arrived to patient room for linecare. Patient care being performed. Theophilus Fitz RN VAST

## 2023-10-22 NOTE — Progress Notes (Signed)
 EEG LTM D/C'd. No noted skin break down. [facility] notified.

## 2023-10-22 NOTE — Progress Notes (Signed)
 Patient is resting in bed at present. Following commands but remains slow. Denies pain or discomfort. Restraints are all off at present.

## 2023-10-22 NOTE — Progress Notes (Signed)
 Progress Note   Patient: [name] [name] RJJ:[id number] DOB: 1980/12/20 DOA: 10/20/2023     1 DOS: the patient was seen and examined on 10/22/2023   Brief hospital course: 43 y.o. male with history of ESRD on hemodialysis MWF, hypertension, prior stroke, history of seizures, lupus anticoagulant brought to the ER on 5/5 after patient had a seizure.  Patient's friend was at the bedside and his roommate states that patient had a seizure on Oct 18, 2023 and he missed his dialysis due to that.  Later in the evening he had another seizure.  He did go to his dialysis on Saturday the following day.  But due to increased confusion patient was brought to the ER.  Initially in the ER patient signed out AMA but on the way out patient was confused and [facility] Department brought him back.  Per patient's friend patient last took his medication on Oct 17, 2023.    The patient has been seen by neurology and nephrology.  He he has had an EEG is suggestive of moderate diffuse encephalopathy.  Sharp transients were noted in the right frontal region.  Per neurology, "because EEG showed rhythmic slowing as well as sharp transients, would recommend long-term EEG monitoring for further evaluation."  Assessment and Plan:  Seizures Patient with known seizure disorder. He is on Keppra  at home. Of note, the patient has presented to the ED several times in the past few months with breakthrough seizures. EEG suggestive of moderate diffuse encephalopathy. - Continue home Keppra . - MRI pending. - Will consult neurology.  Acute encephalopathy Possibly due to postictal state. Psychiatric disorder cannot be ruled out. - MRI. - Neurology consult after MRI. - Psychiatry consult if no organic cause for his encephalopathy.  ESRD on HD MWF. Nephrology consulted.  Input appreciated. - Continue dialysis per home schedule.  Uncontrolled hypertension Home medications include amlodipine , carvedilol . - Continue home  medications.        Subjective: Patient looks better today. He is less lethargic. He is able to tell me where he is and knows he is here because he had a seizure.  Physical Exam: Vitals:   10/22/23 0504 10/22/23 0629 10/22/23 0953 10/22/23 1030  BP: (!) 180/137 (!) 180/119 (!) 202/112 (!) 180/118  Pulse: (!) 105 (!) 101 90   Resp: 20 20 18    Temp: 98.7 F (37.1 C)  97.7 F (36.5 C)   TempSrc: Oral     SpO2: 100% 100% 95%   Weight:      Height:       Physical Exam   General: Alert, oriented X2 Eyes: Pupils equal, reactive  Oral cavity: moist mucous membranes  Head: Atraumatic, normocephalic  Neck: supple  Chest: clear to auscultation. No crackles, no wheezes  CVS: S1,S2 RRR. No murmurs  Abd: No distention, soft, non-tender. No masses palpable  Extr: No edema   MSK: No joint deformities or swelling  Neurological: Grossly intact.    Data Reviewed:     Latest Ref Rng & Units 10/21/2023    2:10 PM 10/20/2023    5:50 PM 10/18/2023    8:03 AM  CBC  WBC 4.0 - 10.5 K/uL 7.3  7.6  8.5   Hemoglobin 13.0 - 17.0 g/dL 01.6  01.0  93.2   Hematocrit 39.0 - 52.0 % 31.9  33.7  32.4   Platelets 150 - 400 K/uL 117  110  92       Latest Ref Rng & Units 10/21/2023  2:10 PM 10/20/2023    5:50 PM 10/18/2023    8:03 AM  BMP  Glucose 70 - 99 mg/dL 86  93  90   BUN 6 - 20 mg/dL 47  41  43   Creatinine 0.61 - 1.24 mg/dL 52.84  13.24  40.10   Sodium 135 - 145 mmol/L 140  141  139   Potassium 3.5 - 5.1 mmol/L 4.1  3.8  4.4   Chloride 98 - 111 mmol/L 98  99  102   CO2 22 - 32 mmol/L 26  26  23    Calcium  8.9 - 10.3 mg/dL 9.2  9.0  8.7      Family Communication: n/a  Disposition: Status is: Inpatient Remains inpatient appropriate because: Ongoing encephalopathy needing evaluation  Planned Discharge Destination: Home    Time spent: 35 minutes  Author: MDALA-GAUSI, Selestino Nila AGATHA, MD 10/22/2023 3:10 PM  For on call review [email or web address].

## 2023-10-22 NOTE — Procedures (Deleted)
 Patient Name: [name] [name]  MRN: [id number]  Epilepsy Attending: Arleene Lack  Referring Physician/Provider: Arleene Lack, MD  Duration: 10/21/2023 1032 to 10/22/2023 0850   Patient history:  43 y.o. male with history of ESRD on hemodialysis, hypertension, prior stroke, history of seizures, lupus anticoagulant was brought to the ER after patient had a seizure.  EEG to evaluate for seizure.   Level of alertness: Awake, asleep   AEDs during EEG study: None   Technical aspects: This EEG study was done with scalp electrodes positioned according to the 10-20 International system of electrode placement. Electrical activity was reviewed with band pass filter of 1-70Hz , sensitivity of 7 uV/mm, display speed of 41mm/sec with a 60Hz  notched filter applied as appropriate. EEG data were recorded continuously and digitally stored.  Video monitoring was available and reviewed as appropriate.   Description: During awake state, no clear posterior dominant rhythm was seen.  Sleep was characterized by sleep spindles (13 to 15 Hz), maximal frontocentral region. EEG showed continuous generalized and at times rhythmic 3 to 6 Hz theta-delta slowing.   Hyperventilation and photic stimulation were not performed.     EEG was disconnected between 10/21/2023 1057 to 2329 for dialysis and imaging.   ABNORMALITY - Continuous slow, generalized   IMPRESSION: This study is suggestive of moderate diffuse encephalopathy. No seizures were noted.   Gidget Quizhpi O Florinda Taflinger

## 2023-10-22 NOTE — Progress Notes (Signed)
 LTM EEG hooked up and running - no initial skin breakdown - push button tested - [facility] monitoring. MR compatible leads applied, nurse aware.

## 2023-10-22 NOTE — Procedures (Addendum)
 Patient Name: JOEVANY MALON  MRN: [id number]  Epilepsy Attending: Arleene Lack  Referring Physician/Provider: Arleene Lack, MD  Duration: 10/21/2023 1032 to 10/22/2023 4098   Patient history:  43 y.o. male with history of ESRD on hemodialysis, hypertension, prior stroke, history of seizures, lupus anticoagulant was brought to the ER after patient had a seizure.  EEG to evaluate for seizure.   Level of alertness: Awake, asleep   AEDs during EEG study: None   Technical aspects: This EEG study was done with scalp electrodes positioned according to the 10-20 International system of electrode placement. Electrical activity was reviewed with band pass filter of 1-70Hz , sensitivity of 7 uV/mm, display speed of 28mm/sec with a 60Hz  notched filter applied as appropriate. EEG data were recorded continuously and digitally stored.  Video monitoring was available and reviewed as appropriate.   Description: During awake state, no clear posterior dominant rhythm was seen.  Sleep was characterized by sleep spindles (13 to 15 Hz), maximal frontocentral region. EEG showed continuous generalized and at times rhythmic 3 to 6 Hz theta-delta slowing.   Hyperventilation and photic stimulation were not performed.      EEG was disconnected between 10/21/2023 1057 to 2329 for dialysis and imaging.   ABNORMALITY - Continuous slow, generalized   IMPRESSION: This study is suggestive of moderate diffuse encephalopathy. No seizures were noted.   Lorenza Winkleman O Evelen Vazguez

## 2023-10-22 NOTE — Progress Notes (Signed)
 [facility] Progress Note   Subjective:    Seen and examined at bedside. Remains IVC status with a sitter. Patient remains pleasantly confused and noted ABD restraint on. Neurology following and EEG completed yesterday. Noted HD ended early yesterday d/t patient being agitated. Noted net UF 2.8L. Next HD 5/7.  Objective Vitals:   10/22/23 0504 10/22/23 0629 10/22/23 0953 10/22/23 1030  BP: (!) 180/137 (!) 180/119 (!) 202/112 (!) 180/118  Pulse: (!) 105 (!) 101 90   Resp: 20 20 18    Temp: 98.7 F (37.1 C)  97.7 F (36.5 C)   TempSrc: Oral     SpO2: 100% 100% 95%   Weight:      Height:       Physical Exam General: Pleasantly confused; NAD  Heart:S1 and S2; No murmurs, gallops, and rubs Lungs: Clear throughout Abdomen: Soft and non-tender Extremities: 1+ b/l lower extremity edema Dialysis Access: R [facility]   Filed Weights   10/21/23 1038 10/21/23 1348 10/21/23 1830  Weight: 127.2 kg 119.3 kg 116.5 kg    Intake/Output Summary (Last 24 hours) at 10/22/2023 1045 Last data filed at 10/22/2023 0620 Gross per 24 hour  Intake 0 ml  Output 2800 ml  Net -2800 ml    Additional Objective Labs: Basic Metabolic Panel: Recent Labs  Lab 10/18/23 0803 10/20/23 1750 10/21/23 1410  NA 139 141 140  K 4.4 3.8 4.1  CL 102 99 98  CO2 23 26 26   GLUCOSE 90 93 86  BUN 43* 41* 47*  CREATININE 16.43* 16.18* 18.76*  CALCIUM  8.7* 9.0 9.2   Liver Function Tests: Recent Labs  Lab 10/18/23 0803 10/20/23 1750 10/21/23 1410  AST 16 18 16   ALT 12 13 12   ALKPHOS 59 60 50  BILITOT 0.7 1.0 0.9  PROT 6.8 7.5 6.8  ALBUMIN  3.4* 3.5 3.2*   No results for input(s): "LIPASE", "AMYLASE" in the last 168 hours. CBC: Recent Labs  Lab 10/18/23 0803 10/20/23 1750 10/21/23 1410  WBC 8.5 7.6 7.3  NEUTROABS 6.7 5.4 5.2  HGB 10.6* 11.1* 10.3*  HCT 32.4* 33.7* 31.9*  MCV 94.7 93.9 94.9  PLT 92* 110* 117*   Blood Culture    Component Value Date/Time   SDES BLOOD RIGHT HAND  01/19/2023 0213   SPECREQUEST  01/19/2023 1610    BOTTLES DRAWN AEROBIC AND ANAEROBIC Blood Culture results may not be optimal due to an excessive volume of blood received in culture bottles   CULT  01/19/2023 0213    NO GROWTH 5 DAYS Performed at [facility], [address], [place], [place] [address]    REPTSTATUS 01/24/2023 FINAL 01/19/2023 4098    Cardiac Enzymes: No results for input(s): "CKTOTAL", "CKMB", "CKMBINDEX", "TROPONINI" in the last 168 hours. CBG: Recent Labs  Lab 10/18/23 0756 10/20/23 1735 10/20/23 1945 10/20/23 2042  GLUCAP 75 89 68* 92   Iron  Studies: No results for input(s): "IRON ", "TIBC", "TRANSFERRIN", "FERRITIN" in the last 72 hours. Lab Results  Component Value Date   INR 2.5 (H) 04/20/2023   INR 2.6 (H) 04/19/2023   INR 2.7 (H) 04/18/2023   PROTIME 28.8 (H) 11/04/2013   PROTIME 21.6 (H) 10/14/2013   PROTIME 18.0 (H) 09/23/2013   Studies/Results: Overnight EEG with video Result Date: 10/22/2023 Arleene Lack, MD     10/22/2023  9:52 AM Patient Name: JAYKON MOTTS MRN: [id number] Epilepsy Attending: Arleene Lack Referring Physician/Provider: Arleene Lack, MD Duration: 10/21/2023 1032 to 10/22/2023 5621  Patient  history:  43 y.o. male with history of ESRD on hemodialysis, hypertension, prior stroke, history of seizures, lupus anticoagulant was brought to the ER after patient had a seizure.  EEG to evaluate for seizure.  Level of alertness: Awake, asleep  AEDs during EEG study: None  Technical aspects: This EEG study was done with scalp electrodes positioned according to the 10-20 International system of electrode placement. Electrical activity was reviewed with band pass filter of 1-70Hz , sensitivity of 7 uV/mm, display speed of 68mm/sec with a 60Hz  notched filter applied as appropriate. EEG data were recorded continuously and digitally stored.  Video monitoring was available and reviewed as appropriate.  Description: During awake state, no clear  posterior dominant rhythm was seen.  Sleep was characterized by sleep spindles (13 to 15 Hz), maximal frontocentral region. EEG showed continuous generalized and at times rhythmic 3 to 6 Hz theta-delta slowing.   Hyperventilation and photic stimulation were not performed.    EEG was disconnected between 10/21/2023 1057 to 2329 for dialysis and imaging.  ABNORMALITY - Continuous slow, generalized  IMPRESSION: This study is suggestive of moderate diffuse encephalopathy. No seizures were noted.  Arleene Lack   EEG adult Result Date: 10/21/2023 Arleene Lack, MD     10/21/2023 11:47 AM Patient Name: EMERICK MATZINGER MRN: [id number] Epilepsy Attending: Arleene Lack Referring Physician/Provider: Angelene Kelly, MD Date:  10/21/2023 Duration: 23.18 mins Patient history:  43 y.o. male with history of ESRD on hemodialysis, hypertension, prior stroke, history of seizures, lupus anticoagulant was brought to the ER after patient had a seizure.  EEG to evaluate for seizure. Level of alertness: Awake, asleep AEDs during EEG study: None Technical aspects: This EEG study was done with scalp electrodes positioned according to the 10-20 International system of electrode placement. Electrical activity was reviewed with band pass filter of 1-70Hz , sensitivity of 7 uV/mm, display speed of 21mm/sec with a 60Hz  notched filter applied as appropriate. EEG data were recorded continuously and digitally stored.  Video monitoring was available and reviewed as appropriate. Description: During awake state, no clear posterior dominant rhythm was seen.  Sleep was characterized by sleep spindles (13 to 15 Hz), maximal frontocentral region.  EEG showed continuous generalized and at times rhythmic 3 to 6 Hz theta-delta slowing.  Sharp transients were noted in right frontal region. Hyperventilation and photic stimulation were not performed.   ABNORMALITY - Continuous slow, generalized IMPRESSION: This study is suggestive of moderate diffuse  encephalopathy.  Sharp transients were noted in the right frontal region.  Because EEG showed rhythmic slowing as well as sharp transients, would recommend long-term EEG monitoring for further evaluation. Arleene Lack   CT Head Wo Contrast Result Date: 10/20/2023 CLINICAL DATA:  Seizure disorder, or altered mental status EXAM: CT HEAD WITHOUT CONTRAST TECHNIQUE: Contiguous axial images were obtained from the base of the skull through the vertex without intravenous contrast. RADIATION DOSE REDUCTION: This exam was performed according to the departmental dose-optimization program which includes automated exposure control, adjustment of the mA and/or kV according to patient size and/or use of iterative reconstruction technique. COMPARISON:  CT head 08/01/2023 FINDINGS: Brain: No intracranial hemorrhage, mass effect, or evidence of acute infarct. No hydrocephalus. No extra-axial fluid collection. Age-commensurate cerebral atrophy and chronic small vessel ischemic disease. Chronic left occipital, left insular, left parietal, left cerebellar, and right frontal infarcts. Vascular: No hyperdense vessel. Intracranial arterial calcification. Skull: No fracture or focal lesion. Sinuses/Orbits: No acute finding. Other: None. IMPRESSION: 1. No acute intracranial  abnormality.  Multiple chronic infarcts. Electronically Signed   By: Rozell Cornet M.D.   On: 10/20/2023 20:24    Medications:   amLODipine   10 mg Oral QHS   apixaban   2.5 mg Oral BID   atorvastatin   40 mg Oral Daily   calcitRIOL   2.5 mcg Oral Q M,W,F-HD   carvedilol   6.25 mg Oral BID WC   cinacalcet   150 mg Oral Q M,W,F-HD   levETIRAcetam   500 mg Oral BID   multivitamin  1 tablet Oral QHS   sevelamer  carbonate  3,200 mg Oral TID WC   sodium chloride  flush  10-40 mL Intracatheter Q12H    Dialysis Orders: MWF - [facility] 4hrs, BFR 400, DFR 800,  EDW 110.5kg, 2K/ 2Ca Heparin : No bolus ordered Mircera: None ordered Calcitriol   2.5mcg PO qHD - last dose 10/19/23 Sensipar  150mg  with HD - last dose 10/19/23  Assessment/Plan: Seizure disorder - Neurology following; EEG suggestive of moderate diffuse encephalopathy, no seizures noted. and MRI brain ordered. Received loading dose of IV Keppra . Acute encephalopathy - Likely postictal, see above ESRD -  on HD MWF. Next HD 5/7. Push UF as tolerated. Noted he became agitated on HD yesterday so will monitor behavior closely. Hypertension/volume  - Blood pressures mainly up likely 2nd hypervolemic. He's nowhere near his EDW in outpatient and occasionally refuses extra treatments. Push UF here as tolerated. Resume home BP meds. Anemia of CKD - Current Hgb at goal. No Fe/ESA is indicated at this time. Secondary Hyperparathyroidism -  Resume Calcitriol  and Sensipar  Nutrition - Renal diet with fluid restriction  Jadene Maxwell, NP [facility] 10/22/2023,10:45 AM  LOS: 1 day

## 2023-10-22 NOTE — Consult Note (Signed)
 NEUROLOGY CONSULT NOTE   Date of service: Oct 22, 2023 Patient Name: QUIENTIN BACALLAO MRN:  [id number] DOB:  [birth year] Chief Complaint: "Acute Encephalopathy " Requesting Provider: Melonie Square, Masiku Agat*  History of Present Illness  DESHAY MEDD is a 43 y.o. male with hx of ESRD on hemodialysis, hypertension, prior stroke, history of seizures, lupus anticoagulant on Eiquis was brought to the ER for seizure and altered mental status.  Recent Interval of events:  Patient previously reported to the emergency department on 5/2 for witnessed seizure, symptoms were stable and he was discharged. He later returned on 5/4 (x2) after recurrent seizures and altered mental status. During first evaluation, patient refused further workup and was eventually discharged AMA. He was brought back by a friend the second time due to persistent altered mental status. He continued to refuse work-up and was placed under IVC due to lack of capacity to make decision. CT imaging was negative for acute process.  He was loaded with 2 g of Keppra . Labs were otherwise unremarkable. Keppra  level elevated at 47.4, collected shortly after keppra  load was given.    On assessment today, patient continues to be confused and is unable to state his appropriate age, month or year. He is oriented to person, place and situation "Im here because of seizures". Patient's friend is bedside and contributive towards history.  States patient had 3 seizures, with first occurring Friday.  Patient missed dialysis session on Friday due to being evaluated in the ED. He went to dialysis Saturday to make up session and had another seizure. States patient misses doses with medications due to lack of organization of current pill regiment and may not recall where meds are. Reports patient previously did well with organized packs and he stayed compliant with regimen.  Patient denies any intolerable side effects to Keppra .   ROS  Comprehensive ROS  performed and pertinent positives documented in HPI per friends recollection   Past History   Past Medical History:  Diagnosis Date   ESRD on hemodialysis (HCC)    Hypertension    Lupus    Seizures (HCC)    Seizures (HCC)    Stroke (HCC)    39 or 43 years of age.     Wears glasses     Past Surgical History:  Procedure Laterality Date   AV FISTULA PLACEMENT Left 12/30/2017   Procedure: ARTERIOVENOUS (AV) FISTULA CREATION VERSUS INSERTION OF ARTERIOVENOUS GRAFT LEFT ARM;  Surgeon: Mayo Speck, MD;  Location: [facility] OR;  Service: Vascular;  Laterality: Left;   BASCILIC VEIN TRANSPOSITION Left 08/15/2018   Procedure: LEFT FIRST STAGE BASCILIC VEIN TRANSPOSITION;  Surgeon: Mayo Speck, MD;  Location: [facility] OR;  Service: Vascular;  Laterality: Left;   BASCILIC VEIN TRANSPOSITION Left 10/30/2018   Procedure: INSERTION OF GORE-TEX GRAFT LEFT UPPER ARM;  Surgeon: Adine Hoof, MD;  Location: [facility] OR;  Service: Vascular;  Laterality: Left;   INSERTION OF DIALYSIS CATHETER N/A 09/20/2018   Procedure: INSERTION OF TUNNELED DIALYSIS CATHETER RIGHT INTERNAL JUGULAR;  Surgeon: Young Hensen, MD;  Location: [facility] OR;  Service: Vascular;  Laterality: N/A;   IR FLUORO GUIDE CV LINE RIGHT  07/16/2022   IR US  GUIDE VASC ACCESS RIGHT  07/16/2022   RENAL BIOPSY      Family History: Family History  Problem Relation Age of Onset   Diabetes Mother    Diabetes Maternal Grandmother    Seizures Neg Hx        not on mother's  side. he doesn't know about his father's side    Social History  reports that he has been smoking e-cigarettes. He has never used smokeless tobacco. He reports that he does not currently use alcohol. He reports that he does not use drugs.  Allergies  Allergen Reactions   Chlorhexidine  Itching   Cetirizine & Related Swelling    Medications   Current Facility-Administered Medications:    amLODipine  (NORVASC ) tablet 10 mg, 10 mg, Oral, QHS, Angelene Kelly, MD    apixaban  (ELIQUIS ) tablet 2.5 mg, 2.5 mg, Oral, BID, Angelene Kelly, MD, 2.5 mg at 10/22/23 0830   atorvastatin  (LIPITOR) tablet 40 mg, 40 mg, Oral, Daily, Angelene Kelly, MD, 40 mg at 10/21/23 1041   calcitRIOL  (ROCALTROL ) capsule 2.5 mcg, 2.5 mcg, Oral, Q M,W,F-HD, Jadene Maxwell E, NP   carvedilol  (COREG ) tablet 6.25 mg, 6.25 mg, Oral, BID WC, Kakrakandy, Arshad N, MD, 6.25 mg at 10/22/23 0831   cinacalcet  (SENSIPAR ) tablet 150 mg, 150 mg, Oral, Q M,W,F-HD, Kitty Perkins, NP   hydrALAZINE  (APRESOLINE ) injection 5 mg, 5 mg, Intravenous, Q6H PRN, Reesa Cannon N, DO, 5 mg at 10/22/23 1003   levETIRAcetam  (KEPPRA ) tablet 500 mg, 500 mg, Oral, BID, Angelene Kelly, MD, 500 mg at 10/22/23 1610   multivitamin (RENA-VIT) tablet 1 tablet, 1 tablet, Oral, QHS, Angelene Kelly, MD   sevelamer  carbonate (RENVELA ) tablet 3,200 mg, 3,200 mg, Oral, TID WC, Angelene Kelly, MD   sodium chloride  flush (NS) 0.9 % injection 10-40 mL, 10-40 mL, Intracatheter, Q12H, Mdala-Gausi, Masiku Agatha, MD, 10 mL at 10/22/23 0831   sodium chloride  flush (NS) 0.9 % injection 10-40 mL, 10-40 mL, Intracatheter, PRN, Mdala-Gausi, Masiku Agatha, MD  Vitals   Vitals:   10/22/23 0504 10/22/23 0629 10/22/23 0953 10/22/23 1030  BP: (!) 180/137 (!) 180/119 (!) 202/112 (!) 180/118  Pulse: (!) 105 (!) 101 90   Resp: 20 20 18    Temp: 98.7 F (37.1 C)  97.7 F (36.5 C)   TempSrc: Oral     SpO2: 100% 100% 95%   Weight:      Height:        Body mass index is 34.83 kg/m.  Physical Exam   Constitutional: Appears well-developed, well-nourished, soft restraints in place Psych: Affect appropriate to situation.  Eyes: No scleral injection.  HENT: No OP obstruction.  Head: Normocephalic. Cardiovascular: Normal rate and regular rhythm.  Respiratory: Effort normal, non-labored breathing.  GI: Soft.  No distension. There is no tenderness.  Skin: WDI.   Neurologic Examination    Neurological Exam:  Mental status/Cognition:  Alert, mildly drowsy, oriented to person, place and situation.  Disoriented to age, month, year. Pt has waning attention and concentration. Pt is able to follow intermittently able to follow step commands, experienced some difficulty in latency with recall Pt has normal comprehension.  Speech/language: Patient does not have dysarthria, normal volume, normal fluency, able to recognize 2 fingers and colors  Cranial nerves:   CN II Pupils equal and reactive to light, pt blinks to threat bilaterally.   CN III,IV,VI EOM intact, no gaze preference or deviation, no nystagmus   CN V normal sensation in V1, V2, and V3 segments bilaterally   CN VII no asymmetry, no nasolabial fold flattening   CN VIII Makes eye contact to speech.   CN IX & X normal palatal elevation, no uvular deviation   CN XI 5/5 head turn and 5/5 shoulder shrug bilaterally   CN XII midline  tongue protrusion    Motor:  Muscle bulk: Normal, tone normal  Strength Testing Left Right  Head turn 5/5 5/5  Shoulder shrug (C5) 5/5 5/5  Hand grip (C8-T1) 5/5 5/5  Deltoid Up (C5-C6) 5/5 5/5  Deltoid Down (C5-C6) 5/5 5/5  Push (C7) 5/5 5/5  Pull (C5-C6) 5/5 5/5  Thigh Raise (L4-S2) 4/5 5/5  Knee Extension (L3-L4) 4/5 5/5  Knee Flexion (L5-S1) 4/5 5/5  Ankle Dorsiflexion (L4-L5) 4/5 5/5  Ankle Plantarflexion (S1-S2) 4/5 5/5        Sensation: Intact throughout to light touch equal in all extremities   Coordination/Complex Motor:  - Finger tap: able to complete, at times was unable to follow instructions when requesting only taping with left hand - Gait: Deferred   Labs/Imaging/Neurodiagnostic studies   CBC:  Recent Labs  Lab 11/12/23 1750 10/21/23 1410  WBC 7.6 7.3  NEUTROABS 5.4 5.2  HGB 11.1* 10.3*  HCT 33.7* 31.9*  MCV 93.9 94.9  PLT 110* 117*   Basic Metabolic Panel:  Lab Results  Component Value Date   NA 140 10/21/2023   K 4.1 10/21/2023   CO2 26  10/21/2023   GLUCOSE 86 10/21/2023   BUN 47 (H) 10/21/2023   CREATININE 18.76 (H) 10/21/2023   CALCIUM  9.2 10/21/2023   GFRNONAA 3 (L) 10/21/2023   GFRAA 4 (L) 01/14/2020   Lipid Panel:  Lab Results  Component Value Date   LDLCALC 105 (H) 01/27/2023   HgbA1c:  Lab Results  Component Value Date   HGBA1C 5.2 01/19/2023   Urine Drug Screen:     Component Value Date/Time   LABOPIA NONE DETECTED 02/15/2009 1435   COCAINSCRNUR NONE DETECTED 02/15/2009 1435   LABBENZ NONE DETECTED 02/15/2009 1435   AMPHETMU NONE DETECTED 02/15/2009 1435   THCU NONE DETECTED 02/15/2009 1435   LABBARB  02/15/2009 1435    NONE DETECTED        DRUG SCREEN FOR MEDICAL PURPOSES ONLY.  IF CONFIRMATION IS NEEDED FOR ANY PURPOSE, NOTIFY LAB WITHIN 5 DAYS.        LOWEST DETECTABLE LIMITS FOR URINE DRUG SCREEN Drug Class       Cutoff (ng/mL) Amphetamine      1000 Barbiturate      200 Benzodiazepine   200 Tricyclics       300 Opiates          300 Cocaine          300 THC              50    Alcohol Level     Component Value Date/Time   ETH <15 10/21/2023 0415   INR  Lab Results  Component Value Date   INR 2.5 (H) 04/20/2023   APTT  Lab Results  Component Value Date   APTT 57 (H) 09/20/2018   AED levels:  Lab Results  Component Value Date   PHENYTOIN  <2.5 REPEATED TO VERIFY (L) 06/09/2010   LAMOTRIGINE  <1.0 (L) 08/01/2023   LEVETIRACETA 47.4 (H) 10/21/2023   CT Head without contrast(Personally reviewed): 12-Nov-2023 1. No acute intracranial abnormality. Multiple chronic infarcts.   MRI Brain: Pending   Neurodiagnostics rEEG: 5/5 report  This study is suggestive of moderate diffuse encephalopathy.  Sharp transients were noted in the right frontal region.     Because EEG showed rhythmic slowing as well as sharp transients, would recommend long-term EEG monitoring for further evaluation.  LTM EEG: 5/6 report  This study is suggestive of moderate diffuse encephalopathy.  No seizures  were noted.   ASSESSMENT   SHINJI MEEKER is a 43 y.o. male with hx of ESRD on hemodialysis, hypertension, prior stroke, history of seizures, lupus anticoagulant on Eiquis was brought to the ER for seizure and altered mental status.  Suspect patient symptoms could be in the context of inconsistent medication compliance with Keppra . Routine EEG from yesterday did show some sharp transients and was suggestive of moderate diffuse encephalopathy. Long-term EEG was completed overnight and was negative for seizures.  He continues to be confused throughout exam which is different from baseline per friends history.  Patient also with lower extremity weakness notable on exam, patient's friend states that weakness is chronic, however has never been this bad previously. Patient sensation is intact bilaterally and equal on all extremities.   RECOMMENDATIONS  - MRI ordered to workup for acute abnormality - Continue with Keppra  at 500 mg twice daily - Additional labs for encephalopathic workup include: TSH, HIV, syphilis, RPR, Vitamin B9, vitamin B12, vitamin B1 - Recommend loading dose of thiamine ______________________________________________________________________  Signed, DONOVAN RAINEY, MD [facility]- PGY-1   NEUROHOSPITALIST ADDENDUM Performed a face to face diagnostic evaluation.   I have reviewed the contents of history and physical exam as documented by PA/ARNP/Resident and agree with above documentation.  I have discussed and formulated the above plan as documented. Edits to the note have been made as needed.  Impression/Key exam findings/Plan: confused, disoriented to month and year. This is differrent than his baseline per his roommate. Routine EEG with transient R sharps, LTM with no seizures and off LTM. Overall, roommate reports non compliance with AEDs largely due to him forgetting to take his medications. Felt that when patient was on blister packs of medications, that was  helpful. I suspect that his confusion is likely post ictal but I agree with getting MRI Brain since this has been going on for longer than expected. Will check encephalopathy labs. He does not appear meningitic or septic.  Thiamine replacement ordered.  Kairyn Olmeda, MD [facility] [phone]   If 7pm to 7am, please call on call as listed on AMION.

## 2023-10-22 NOTE — Plan of Care (Signed)
  Problem: Safety: Goal: Non-violent Restraint(s) Outcome: Progressing   

## 2023-10-23 DIAGNOSIS — N186 End stage renal disease: Secondary | ICD-10-CM | POA: Diagnosis not present

## 2023-10-23 DIAGNOSIS — I120 Hypertensive chronic kidney disease with stage 5 chronic kidney disease or end stage renal disease: Secondary | ICD-10-CM | POA: Diagnosis not present

## 2023-10-23 DIAGNOSIS — R410 Disorientation, unspecified: Secondary | ICD-10-CM | POA: Diagnosis not present

## 2023-10-23 DIAGNOSIS — G9340 Encephalopathy, unspecified: Secondary | ICD-10-CM | POA: Diagnosis not present

## 2023-10-23 LAB — RENAL FUNCTION PANEL
Albumin: 3.3 g/dL — ABNORMAL LOW (ref 3.5–5.0)
Anion gap: 14 (ref 5–15)
BUN: 46 mg/dL — ABNORMAL HIGH (ref 6–20)
CO2: 26 mmol/L (ref 22–32)
Calcium: 9.6 mg/dL (ref 8.9–10.3)
Chloride: 99 mmol/L (ref 98–111)
Creatinine, Ser: 17.76 mg/dL — ABNORMAL HIGH (ref 0.61–1.24)
GFR, Estimated: 3 mL/min — ABNORMAL LOW (ref >60)
Glucose, Bld: 93 mg/dL (ref 70–99)
Phosphorus: 7.6 mg/dL — ABNORMAL HIGH (ref 2.5–4.6)
Potassium: 4.1 mmol/L (ref 3.5–5.1)
Sodium: 139 mmol/L (ref 135–145)

## 2023-10-23 LAB — CBC
HCT: 35.2 % — ABNORMAL LOW (ref 39.0–52.0)
Hemoglobin: 11.5 g/dL — ABNORMAL LOW (ref 13.0–17.0)
MCH: 30.3 pg (ref 26.0–34.0)
MCHC: 32.7 g/dL (ref 30.0–36.0)
MCV: 92.9 fL (ref 80.0–100.0)
Platelets: 144 10*3/uL — ABNORMAL LOW (ref 150–400)
RBC: 3.79 MIL/uL — ABNORMAL LOW (ref 4.22–5.81)
RDW: 13.5 % (ref 11.5–15.5)
WBC: 8.2 10*3/uL (ref 4.0–10.5)
nRBC: 0 % (ref 0.0–0.2)

## 2023-10-23 LAB — BASIC METABOLIC PANEL WITH GFR
Anion gap: 19 — ABNORMAL HIGH (ref 5–15)
BUN: 27 mg/dL — ABNORMAL HIGH (ref 6–20)
CO2: 24 mmol/L (ref 22–32)
Calcium: 10 mg/dL (ref 8.9–10.3)
Chloride: 93 mmol/L — ABNORMAL LOW (ref 98–111)
Creatinine, Ser: 12.18 mg/dL — ABNORMAL HIGH (ref 0.61–1.24)
GFR, Estimated: 5 mL/min — ABNORMAL LOW (ref >60)
Glucose, Bld: 89 mg/dL (ref 70–99)
Potassium: 4.5 mmol/L (ref 3.5–5.1)
Sodium: 136 mmol/L (ref 135–145)

## 2023-10-23 LAB — RPR: RPR Ser Ql: NONREACTIVE

## 2023-10-23 MED ORDER — THIAMINE HCL 100 MG/ML IJ SOLN
250.0000 mg | INTRAVENOUS | Status: DC
  Filled 2023-10-23: qty 2.5

## 2023-10-23 MED ORDER — THIAMINE MONONITRATE 100 MG PO TABS: 100.0000 mg | ORAL_TABLET | Freq: Every day | ORAL | Status: DC

## 2023-10-23 MED ORDER — HYDRALAZINE HCL 20 MG/ML IJ SOLN
INTRAMUSCULAR | Status: AC
Start: 2023-10-23 — End: ?
  Filled 2023-10-23: qty 1

## 2023-10-23 MED ORDER — THIAMINE HCL 100 MG/ML IJ SOLN
500.0000 mg | Freq: Three times a day (TID) | INTRAVENOUS | Status: DC
  Administered 2023-10-23 – 2023-10-24 (×5): 500 mg via INTRAVENOUS
  Filled 2023-10-23: qty 4
  Filled 2023-10-23 (×3): qty 500
  Filled 2023-10-23 (×2): qty 5

## 2023-10-23 MED ORDER — HEPARIN SODIUM (PORCINE) 1000 UNIT/ML IJ SOLN
3200.0000 [IU] | Freq: Once | INTRAMUSCULAR | Status: AC
  Administered 2023-10-25: 3200 [IU]

## 2023-10-23 MED ORDER — HEPARIN SODIUM (PORCINE) 1000 UNIT/ML IJ SOLN
INTRAMUSCULAR | Status: AC
  Filled 2023-10-23: qty 4

## 2023-10-23 NOTE — Plan of Care (Signed)
   Problem: Education: Goal: Knowledge of General Education information will improve Description Including pain rating scale, medication(s)/side effects and non-pharmacologic comfort measures Outcome: Progressing   Problem: Health Behavior/Discharge Planning: Goal: Ability to manage health-related needs will improve Outcome: Progressing

## 2023-10-23 NOTE — Plan of Care (Signed)
  Problem: Safety: Goal: Non-violent Restraint(s) Outcome: Completed/Met   

## 2023-10-23 NOTE — Progress Notes (Signed)
 [facility] Progress Note   Subjective:    Seen and examined at bedside in dialysis.  Calm on treatment.  Speaking and answering questions, denies new complaints.  Cont w a sitter - restless but hasn't required intervention.  Objective Vitals:   10/22/23 0953 10/22/23 1030 10/22/23 1946 10/23/23 0808  BP: (!) 202/112 (!) 180/118 (!) 165/119 (!) 165/118  Pulse: 90  91 84  Resp: 18  18 (!) 23  Temp: 97.7 F (36.5 C)  98.6 F (37 C) 98.8 F (37.1 C)  TempSrc:   Oral   SpO2: 95%  100% 100%  Weight:    115.6 kg  Height:       Physical Exam General: resting with eyes closed but will awaken and follow commands; NAD  Heart:S1 and S2; No murmurs, gallops, and rubs Lungs: Clear throughout Abdomen: Soft and non-tender Extremities: no LE edema Dialysis Access: [facility]   Filed Weights   10/21/23 1348 10/21/23 1830 10/23/23 0808  Weight: 119.3 kg 116.5 kg 115.6 kg    Intake/Output Summary (Last 24 hours) at 10/23/2023 0853 Last data filed at 10/23/2023 0600 Gross per 24 hour  Intake 360 ml  Output 3 ml  Net 357 ml    Additional Objective Labs: Basic Metabolic Panel: Recent Labs  Lab 10/18/23 0803 10/20/23 1750 10/21/23 1410  NA 139 141 140  K 4.4 3.8 4.1  CL 102 99 98  CO2 23 26 26   GLUCOSE 90 93 86  BUN 43* 41* 47*  CREATININE 16.43* 16.18* 18.76*  CALCIUM  8.7* 9.0 9.2   Liver Function Tests: Recent Labs  Lab 10/18/23 0803 10/20/23 1750 10/21/23 1410  AST 16 18 16   ALT 12 13 12   ALKPHOS 59 60 50  BILITOT 0.7 1.0 0.9  PROT 6.8 7.5 6.8  ALBUMIN  3.4* 3.5 3.2*   No results for input(s): "LIPASE", "AMYLASE" in the last 168 hours. CBC: Recent Labs  Lab 10/18/23 0803 10/20/23 1750 10/21/23 1410 10/23/23 0830  WBC 8.5 7.6 7.3 8.2  NEUTROABS 6.7 5.4 5.2  --   HGB 10.6* 11.1* 10.3* 11.5*  HCT 32.4* 33.7* 31.9* 35.2*  MCV 94.7 93.9 94.9 92.9  PLT 92* 110* 117* 144*   Blood Culture    Component Value Date/Time   SDES BLOOD RIGHT HAND  01/19/2023 0213   SPECREQUEST  01/19/2023 1610    BOTTLES DRAWN AEROBIC AND ANAEROBIC Blood Culture results may not be optimal due to an excessive volume of blood received in culture bottles   CULT  01/19/2023 0213    NO GROWTH 5 DAYS Performed at [facility], [address], [place], [place] [address]    REPTSTATUS 01/24/2023 FINAL 01/19/2023 4098    Cardiac Enzymes: No results for input(s): "CKTOTAL", "CKMB", "CKMBINDEX", "TROPONINI" in the last 168 hours. CBG: Recent Labs  Lab 10/18/23 0756 10/20/23 1735 10/20/23 1945 10/20/23 2042 10/22/23 1123  GLUCAP 75 89 68* 92 94   Iron  Studies: No results for input(s): "IRON ", "TIBC", "TRANSFERRIN", "FERRITIN" in the last 72 hours. Lab Results  Component Value Date   INR 2.5 (H) 04/20/2023   INR 2.6 (H) 04/19/2023   INR 2.7 (H) 04/18/2023   PROTIME 28.8 (H) 11/04/2013   PROTIME 21.6 (H) 10/14/2013   PROTIME 18.0 (H) 09/23/2013   Studies/Results: MR BRAIN WO CONTRAST Result Date: 10/22/2023 CLINICAL DATA:  Neuro deficit, acute, stroke suspected EXAM: MRI HEAD WITHOUT CONTRAST TECHNIQUE: Multiplanar, multiecho pulse sequences of the brain and surrounding structures were obtained without intravenous  contrast. COMPARISON:  CT head May 4, 25. FINDINGS: Motion limited study.  Within this limitation: Brain: No acute infarction, hemorrhage, hydrocephalus, extra-axial collection or mass lesion. Remote bilateral cerebellar infarcts. Remote left frontoparietal and bilateral occipital infarcts. Advanced patchy and confluent T2/FLAIR hyperintensity in the white matter, compatible with chronic microvascular ischemic disease. Vascular: Major arterial flow voids are maintained at the skull base. Skull and upper cervical spine: Normal marrow signal. Sinuses/Orbits: Clear sinuses.  No acute orbital findings. IMPRESSION: 1. No evidence of acute intracranial abnormality. 2. Multiple remote infarcts and advanced chronic microvascular ischemic disease.  Electronically Signed   By: Stevenson Elbe M.D.   On: 10/22/2023 19:57   Overnight EEG with video Result Date: 10/22/2023 Arleene Lack, MD     10/22/2023  9:52 AM Patient Name: [name] [name] MRN: [id number] Epilepsy Attending: Arleene Lack Referring Physician/Provider: Arleene Lack, MD Duration: 10/21/2023 1032 to 10/22/2023 9147  Patient history:  43 y.o. male with history of ESRD on hemodialysis, hypertension, prior stroke, history of seizures, lupus anticoagulant was brought to the ER after patient had a seizure.  EEG to evaluate for seizure.  Level of alertness: Awake, asleep  AEDs during EEG study: None  Technical aspects: This EEG study was done with scalp electrodes positioned according to the 10-20 International system of electrode placement. Electrical activity was reviewed with band pass filter of 1-70Hz , sensitivity of 7 uV/mm, display speed of 47mm/sec with a 60Hz  notched filter applied as appropriate. EEG data were recorded continuously and digitally stored.  Video monitoring was available and reviewed as appropriate.  Description: During awake state, no clear posterior dominant rhythm was seen.  Sleep was characterized by sleep spindles (13 to 15 Hz), maximal frontocentral region. EEG showed continuous generalized and at times rhythmic 3 to 6 Hz theta-delta slowing.   Hyperventilation and photic stimulation were not performed.    EEG was disconnected between 10/21/2023 1057 to 2329 for dialysis and imaging.  ABNORMALITY - Continuous slow, generalized  IMPRESSION: This study is suggestive of moderate diffuse encephalopathy. No seizures were noted.  Arleene Lack   EEG adult Result Date: 10/21/2023 Arleene Lack, MD     10/21/2023 11:47 AM Patient Name: [name] [name] MRN: [id number] Epilepsy Attending: Arleene Lack Referring Physician/Provider: Angelene Kelly, MD Date:  10/21/2023 Duration: 23.18 mins Patient history:  43 y.o. male with history of ESRD on hemodialysis,  hypertension, prior stroke, history of seizures, lupus anticoagulant was brought to the ER after patient had a seizure.  EEG to evaluate for seizure. Level of alertness: Awake, asleep AEDs during EEG study: None Technical aspects: This EEG study was done with scalp electrodes positioned according to the 10-20 International system of electrode placement. Electrical activity was reviewed with band pass filter of 1-70Hz , sensitivity of 7 uV/mm, display speed of 58mm/sec with a 60Hz  notched filter applied as appropriate. EEG data were recorded continuously and digitally stored.  Video monitoring was available and reviewed as appropriate. Description: During awake state, no clear posterior dominant rhythm was seen.  Sleep was characterized by sleep spindles (13 to 15 Hz), maximal frontocentral region.  EEG showed continuous generalized and at times rhythmic 3 to 6 Hz theta-delta slowing.  Sharp transients were noted in right frontal region. Hyperventilation and photic stimulation were not performed.   ABNORMALITY - Continuous slow, generalized IMPRESSION: This study is suggestive of moderate diffuse encephalopathy.  Sharp transients were noted in the right frontal region.  Because EEG showed rhythmic slowing  as well as sharp transients, would recommend long-term EEG monitoring for further evaluation. Priyanka O Yadav    Medications:   amLODipine   10 mg Oral QHS   apixaban   2.5 mg Oral BID   atorvastatin   40 mg Oral Daily   calcitRIOL   2.5 mcg Oral Q M,W,F-HD   carvedilol   6.25 mg Oral BID WC   cinacalcet   150 mg Oral Q M,W,F-HD   levETIRAcetam   500 mg Oral BID   multivitamin  1 tablet Oral QHS   sevelamer  carbonate  3,200 mg Oral TID WC   sodium chloride  flush  10-40 mL Intracatheter Q12H   thiamine (VITAMIN B1) injection  100 mg Intravenous Daily   Or   thiamine  100 mg Oral Daily    Dialysis Orders: MWF [facility], BFR 400, DFR 800,  EDW 110.5kg, 2K/ 2Ca Heparin : No bolus  ordered Mircera: None ordered Calcitriol  2.5mcg PO qHD - last dose 10/19/23 Sensipar  150mg  with HD - last dose 10/19/23  Assessment/Plan: Seizure disorder - Neurology following; EEG suggestive of moderate diffuse encephalopathy, no seizures noted. and MRI brain no acute changes, just chronic. Received loading dose of IV Keppra . Acute encephalopathy - Likely postictal, see above.  Does seem to be improving but it's been slow lasting days. ESRD -  on HD MWF. Tolerating tx well today.  Hypertension/volume  - Blood pressures mainly up likely 2nd hypervolemic. He's nowhere near his EDW in outpatient and occasionally refuses extra treatments. Push UF here as tolerated. Resume home BP meds. Anemia of CKD - Current Hgb at goal. No Fe/ESA is indicated at this time. Secondary Hyperparathyroidism -  Resume Calcitriol  and Sensipar  Nutrition - Renal diet with fluid restriction  Adrian Alba MD [facility] Pager [phone]

## 2023-10-23 NOTE — Progress Notes (Signed)
 Progress Note   Patient: [name] [name] ZOX:[id number] DOB: 03/11/1981 DOA: 10/20/2023     2 DOS: the patient was seen and examined on 10/23/2023   Brief hospital course: 43 y.o. male with history of ESRD on hemodialysis MWF, hypertension, prior stroke, history of seizures, lupus anticoagulant brought to the ER on 5/5 after patient had a seizure.  Patient's friend was at the bedside and his roommate states that patient had a seizure on Oct 18, 2023 and he missed his dialysis due to that.  Later in the evening he had another seizure.  He did go to his dialysis on Saturday the following day.  But due to increased confusion patient was brought to the ER.  Initially in the ER patient signed out AMA but on the way out patient was confused and [facility] Department brought him back.  Per patient's friend patient last took his medication on Oct 17, 2023.    The patient has been seen by neurology and nephrology.  He he has had an EEG is suggestive of moderate diffuse encephalopathy.  Sharp transients were noted in the right frontal region.  Per neurology, "because EEG showed rhythmic slowing as well as sharp transients, would recommend long-term EEG monitoring for further evaluation."  Assessment and Plan:  Seizures Patient with known seizure disorder. He is on Keppra  at home. Of note, the patient has presented to the ED several times in the past few months with breakthrough seizures. EEG suggestive of moderate diffuse encephalopathy. MRI showed multiple remote infarcts, no acute changes. Patient has been seen by neurology, who recommended an encephalopathy workup as well as high-dose thiamine. - Continue home Keppra .  Acute encephalopathy Possibly due to postictal state. Psychiatric disorder seems unlikely, given EEG findings. Possible the patient has suffered from hypoxic brain injury related to his seizures. Patient has been seen by neurology who recommended an encephalopathy workup. TSH:  WNL. HIV: Nonreactive. B12: 539. RPR, B1 pending. - Will increase thiamine to Wernicke's dose. -Will continue to discuss management with neurology.  ESRD on HD MWF. Nephrology consulted.  Input appreciated. - Continue dialysis per home schedule.  Uncontrolled hypertension Home medications include amlodipine , carvedilol . - Continue home medications.     Subjective: Patient continues to make progress.  He is cooperative.  Mental status is not back to baseline as patient is not able to carry out any meaningful conversation.  Has some echolalia.  Physical Exam: Vitals:   10/23/23 1130 10/23/23 1157 10/23/23 1204 10/23/23 1245  BP: (!) 155/123 (!) 164/126 (!) 167/122 (!) 177/128  Pulse: 97 (!) 123 (!) 103 97  Resp: 19 19 20 18   Temp:   97.9 F (36.6 C) 98.3 F (36.8 C)  TempSrc:    Oral  SpO2: 100% 100% 98% 100%  Weight:      Height:       Physical Exam   General: Alert, oriented X2 Eyes: Pupils equal, reactive  Oral cavity: moist mucous membranes  Head: Atraumatic, normocephalic  Neck: supple  Chest: clear to auscultation. No crackles, no wheezes  CVS: S1,S2 RRR. No murmurs  Abd: No distention, soft, non-tender. No masses palpable  Extr: No edema   MSK: No joint deformities or swelling  Neurological: Grossly intact.    Data Reviewed:     Latest Ref Rng & Units 10/23/2023    8:30 AM 10/21/2023    2:10 PM 10/20/2023    5:50 PM  CBC  WBC 4.0 - 10.5 K/uL 8.2  7.3  7.6  Hemoglobin 13.0 - 17.0 g/dL 16.1  09.6  04.5   Hematocrit 39.0 - 52.0 % 35.2  31.9  33.7   Platelets 150 - 400 K/uL 144  117  110       Latest Ref Rng & Units 10/23/2023    8:30 AM 10/21/2023    2:10 PM 10/20/2023    5:50 PM  BMP  Glucose 70 - 99 mg/dL 93  86  93   BUN 6 - 20 mg/dL 46  47  41   Creatinine 0.61 - 1.24 mg/dL 40.98  11.91  47.82   Sodium 135 - 145 mmol/L 139  140  141   Potassium 3.5 - 5.1 mmol/L 4.1  4.1  3.8   Chloride 98 - 111 mmol/L 99  98  99   CO2 22 - 32 mmol/L 26  26  26     Calcium  8.9 - 10.3 mg/dL 9.6  9.2  9.0      Family Communication: n/a  Disposition: Status is: Inpatient Remains inpatient appropriate because: Ongoing encephalopathy needing evaluation  Planned Discharge Destination: Home    Time spent: 35 minutes  Author: MDALA-GAUSI, Denessa Cavan AGATHA, MD 10/23/2023 2:09 PM  For on call review [email or web address].

## 2023-10-23 NOTE — Progress Notes (Signed)
 NEUROLOGY FOLLOW UP NOTE   Date of service: Oct 23, 2023 Patient Name: [name] [name] MRN:  [id number] DOB:  04-11-1981 Chief Complaint: "Acute Encephalopathy " Requesting Provider: Melonie Square, Masiku Agat*  Interval HP/Subjective  [name] [name] is a 43 y.o. male with hx of ESRD on hemodialysis, hypertension, prior stroke, history of seizures, lupus anticoagulant on Eiquis was brought to the ER for seizure and altered mental status.  Recent Interval of events:  Patient previously reported to the emergency department multiple times within the last few days for recurrent seizures and altered mental status. Placed IVC due to lack of capacity to make decision. CT imaging was negative for acute process.  He was loaded with 2 g of Keppra . Labs were otherwise unremarkable. Keppra  level elevated at 47.4, collected shortly after keppra  load was given. Friend notes medication non-compliance due to forgetting to take medicines.   On assessment today, patient interviewed while undergoing dialysis. Patient continues to be confused and is unable to state his appropriate age, month or year. He is oriented to person. Drowsy and able to follow some commands.      ROS  Unable to assess due to patients mentation and cooperation   Past History   Past Medical History:  Diagnosis Date   ESRD on hemodialysis (HCC)    Hypertension    Lupus    Seizures (HCC)    Seizures (HCC)    Stroke (HCC)    7 or 43 years of age.     Wears glasses     Past Surgical History:  Procedure Laterality Date   AV FISTULA PLACEMENT Left 12/30/2017   Procedure: ARTERIOVENOUS (AV) FISTULA CREATION VERSUS INSERTION OF ARTERIOVENOUS GRAFT LEFT ARM;  Surgeon: Mayo Speck, MD;  Location: [facility] OR;  Service: Vascular;  Laterality: Left;   BASCILIC VEIN TRANSPOSITION Left 08/15/2018   Procedure: LEFT FIRST STAGE BASCILIC VEIN TRANSPOSITION;  Surgeon: Mayo Speck, MD;  Location: [facility] OR;  Service: Vascular;  Laterality: Left;    BASCILIC VEIN TRANSPOSITION Left 10/30/2018   Procedure: INSERTION OF GORE-TEX GRAFT LEFT UPPER ARM;  Surgeon: Adine Hoof, MD;  Location: [facility] OR;  Service: Vascular;  Laterality: Left;   INSERTION OF DIALYSIS CATHETER N/A 09/20/2018   Procedure: INSERTION OF TUNNELED DIALYSIS CATHETER RIGHT INTERNAL JUGULAR;  Surgeon: Young Hensen, MD;  Location: [facility] OR;  Service: Vascular;  Laterality: N/A;   IR FLUORO GUIDE CV LINE RIGHT  07/16/2022   IR US  GUIDE VASC ACCESS RIGHT  07/16/2022   RENAL BIOPSY      Family History: Family History  Problem Relation Age of Onset   Diabetes Mother    Diabetes Maternal Grandmother    Seizures Neg Hx        not on mother's side. he doesn't know about his father's side    Social History  reports that he has been smoking e-cigarettes. He has never used smokeless tobacco. He reports that he does not currently use alcohol. He reports that he does not use drugs.  Allergies  Allergen Reactions   Chlorhexidine  Itching   Cetirizine & Related Swelling    Medications   Current Facility-Administered Medications:    amLODipine  (NORVASC ) tablet 10 mg, 10 mg, Oral, QHS, Kakrakandy, Arshad N, MD, 10 mg at 10/22/23 2045   apixaban  (ELIQUIS ) tablet 2.5 mg, 2.5 mg, Oral, BID, Angelene Kelly, MD, 2.5 mg at 10/22/23 2045   atorvastatin  (LIPITOR) tablet 40 mg, 40 mg, Oral, Daily, Angelene Kelly, MD, 40  mg at 10/21/23 1041   calcitRIOL  (ROCALTROL ) capsule 2.5 mcg, 2.5 mcg, Oral, Q M,W,F-HD, Jadene Maxwell E, NP   carvedilol  (COREG ) tablet 6.25 mg, 6.25 mg, Oral, BID WC, Kakrakandy, Arshad N, MD, 6.25 mg at 10/22/23 1741   cinacalcet  (SENSIPAR ) tablet 150 mg, 150 mg, Oral, Q M,W,F-HD, Kitty Perkins, NP   hydrALAZINE  (APRESOLINE ) injection 5 mg, 5 mg, Intravenous, Q6H PRN, Reesa Cannon N, DO, 5 mg at 10/22/23 1003   levETIRAcetam  (KEPPRA ) tablet 500 mg, 500 mg, Oral, BID, Angelene Kelly, MD, 500 mg at 10/22/23 2045   multivitamin  (RENA-VIT) tablet 1 tablet, 1 tablet, Oral, QHS, Angelene Kelly, MD, 1 tablet at 10/22/23 2045   sevelamer  carbonate (RENVELA ) tablet 3,200 mg, 3,200 mg, Oral, TID WC, Kakrakandy, Arshad N, MD   sodium chloride  flush (NS) 0.9 % injection 10-40 mL, 10-40 mL, Intracatheter, Q12H, Mdala-Gausi, Masiku Agatha, MD, 10 mL at 10/22/23 0831   sodium chloride  flush (NS) 0.9 % injection 10-40 mL, 10-40 mL, Intracatheter, PRN, Mdala-Gausi, Masiku Agatha, MD   thiamine (VITAMIN B1) injection 100 mg, 100 mg, Intravenous, Daily **OR** thiamine (VITAMIN B1) tablet 100 mg, 100 mg, Oral, Daily, Dareth Andrew, MD, 100 mg at 10/22/23 2045  Vitals   Vitals:   10/22/23 1946 10/23/23 0808 10/23/23 0830 10/23/23 0902  BP: (!) 165/119 (!) 165/118 (!) 187/133 (!) 158/107  Pulse: 91 84    Resp: 18 (!) 23 (!) 22 17  Temp: 98.6 F (37 C) 98.8 F (37.1 C)    TempSrc: Oral     SpO2: 100% 100% 97% 98%  Weight:  115.6 kg    Height:        Body mass index is 34.56 kg/m.  Physical Exam   Constitutional: Appears well-developed, well-nourishe Psych: Affect appropriate to situation.  Eyes: No scleral injection.  HENT: No OP obstruction.  Head: Normocephalic. Cardiovascular: Normal rate and regular rhythm.  Respiratory: Effort normal, non-labored breathing.  GI: Soft.  No distension. There is no tenderness.  Skin: WDI.   Neurologic Examination   Neurological Exam:  Mental status/Cognition:  Alert, mildly drowsy, oriented to person. Disoriented to age, month, year. Pt has waning attention and concentration. Pt is able to follow intermittently able to follow step commands, experienced some difficulty in latency with recall.   Speech/language: Patient does not have dysarthria, normal volume, normal fluency  Cranial nerves:   CN II Pupils equal and reactive to light, pt blinks to threat bilaterally.   CN III,IV,VI EOM intact, no gaze preference or deviation, no nystagmus   CN V normal sensation in  V1, V2, and V3 segments bilaterally   CN VII no asymmetry, no nasolabial fold flattening   CN VIII Makes eye contact to speech.   CN IX & X normal palatal elevation, no uvular deviation   CN XI 5/5 head turn and 5/5 shoulder shrug bilaterally   CN XII midline tongue protrusion    Motor:  Muscle bulk: Normal, tone normal  Strength Testing Left Right  Head turn 5/5 5/5  Shoulder shrug (C5) 5/5 5/5  Hand grip (C8-T1) 5/5 5/5  Deltoid Up (C5-C6) 5/5 5/5  Deltoid Down (C5-C6) 5/5 5/5  Push (C7) 5/5 5/5  Pull (C5-C6) 5/5 5/5  Thigh Raise (L4-S2) 4/5 5/5  Knee Extension (L3-L4) 4/5 5/5  Knee Flexion (L5-S1) 4/5 5/5  Ankle Dorsiflexion (L4-L5) 4/5 5/5  Ankle Plantarflexion (S1-S2) 4/5 5/5        Sensation: Intact throughout to light touch equal in  all extremities   Coordination/Complex Motor:  - Finger to nose: No ataxia  - Gait: Deferred   Labs/Imaging/Neurodiagnostic studies   CBC:  Recent Labs  Lab 2023-11-18 1750 10/21/23 1410 10/23/23 0830  WBC 7.6 7.3 8.2  NEUTROABS 5.4 5.2  --   HGB 11.1* 10.3* 11.5*  HCT 33.7* 31.9* 35.2*  MCV 93.9 94.9 92.9  PLT 110* 117* 144*   Basic Metabolic Panel:  Lab Results  Component Value Date   NA 139 10/23/2023   K 4.1 10/23/2023   CO2 26 10/23/2023   GLUCOSE 93 10/23/2023   BUN 46 (H) 10/23/2023   CREATININE 17.76 (H) 10/23/2023   CALCIUM  9.6 10/23/2023   GFRNONAA 3 (L) 10/23/2023   GFRAA 4 (L) 01/14/2020   Lipid Panel:  Lab Results  Component Value Date   LDLCALC 105 (H) 01/27/2023   HgbA1c:  Lab Results  Component Value Date   HGBA1C 5.2 01/19/2023   Urine Drug Screen:     Component Value Date/Time   LABOPIA NONE DETECTED 02/15/2009 1435   COCAINSCRNUR NONE DETECTED 02/15/2009 1435   LABBENZ NONE DETECTED 02/15/2009 1435   AMPHETMU NONE DETECTED 02/15/2009 1435   THCU NONE DETECTED 02/15/2009 1435   LABBARB  02/15/2009 1435    NONE DETECTED        DRUG SCREEN FOR MEDICAL PURPOSES ONLY.  IF CONFIRMATION IS  NEEDED FOR ANY PURPOSE, NOTIFY LAB WITHIN 5 DAYS.        LOWEST DETECTABLE LIMITS FOR URINE DRUG SCREEN Drug Class       Cutoff (ng/mL) Amphetamine      1000 Barbiturate      200 Benzodiazepine   200 Tricyclics       300 Opiates          300 Cocaine          300 THC              50    Alcohol Level     Component Value Date/Time   ETH <15 10/21/2023 0415   INR  Lab Results  Component Value Date   INR 2.5 (H) 04/20/2023   APTT  Lab Results  Component Value Date   APTT 57 (H) 09/20/2018   AED levels:  Lab Results  Component Value Date   PHENYTOIN  <2.5 REPEATED TO VERIFY (L) 06/09/2010   LAMOTRIGINE  <1.0 (L) 08/01/2023   LEVETIRACETA 47.4 (H) 10/21/2023   CT Head without contrast(Personally reviewed): 2023/11/18 1. No acute intracranial abnormality. Multiple chronic infarcts.   MRI Brain:(5/6) 1. No evidence of acute intracranial abnormality. 2. Multiple remote infarcts and advanced chronic microvascular ischemic disease.  Neurodiagnostics rEEG: 5/5 report  This study is suggestive of moderate diffuse encephalopathy.  Sharp transients were noted in the right frontal region.     Because EEG showed rhythmic slowing as well as sharp transients, would recommend long-term EEG monitoring for further evaluation.  LTM EEG: 5/6 report  This study is suggestive of moderate diffuse encephalopathy. No seizures were noted.   ASSESSMENT   [name] [name] is a 43 y.o. male with hx of ESRD on hemodialysis, hypertension, prior stroke, history of seizures, lupus anticoagulant on Eiquis was brought to the ER for seizure and altered mental status.  Suspect patient symptoms could be in the context of inconsistent medication compliance with Keppra . Routine EEG from 5/5 did show some sharp transients and was suggestive of moderate diffuse encephalopathy. Long-term EEG 5/6 negative for seizures.  He continues to be confused throughout exam  which is different from baseline per friends  history. Suspect, symptoms could be due to postictal. Given persistent confusion, will consider if patient is having subclinical seizures as well. He does not appear meningitic or septic.  RECOMMENDATIONS  - MRI negative for acute abnormality  - Continue with Keppra  at 500 mg twice daily - Additional labs for encephalopathic workup WNL include TSH, HIV, syphilis, RPR, Vitamin B9, vitamin B12 - F/u Vitamin B1 - F/u carbon monoxide lab - Continue Thiamine 100 mg daily  ______________________________________________________________________  Signed, DONOVAN RAINEY, MD [facility]- PGY-1  NEUROHOSPITALIST ADDENDUM Performed a face to face diagnostic evaluation.   I have reviewed the contents of history and physical exam as documented by PA/ARNP/Resident and agree with above documentation.  I have discussed and formulated the above plan as documented. Edits to the note have been made as needed.  Impression/Key exam findings/Plan: continues to be confused. Would like to see how he does tomorrow and if persistently encephalopathic, will consider LP and further workup.  Jhair Witherington, MD [facility] [phone]   If 7pm to 7am, please call on call as listed on AMION.

## 2023-10-23 NOTE — TOC Initial Note (Addendum)
 Transition of Care [facility]) - Initial/Assessment Note    Patient Details  Name: [name] [name] MRN: [id number] Date of Birth: 1980-06-29  Transition of [facility] CM/SW Contact:    Juliane Och, LCSW Phone Number: 10/23/2023, 3:07 PM  Clinical Narrative:                  3:08 PM CSW introduced self and role to patient at bedside (per chart review, patient is disoriented x3 and has memory impairment; CSW followed up with patient sitter who stated that it was appropriate for CSW to speak with patient). CSW followed up on SDOH needs (housing) and offered resource. Patient confirmed needs and accepted CSW offer. CSW provided housing resources. Patient denied SNF/HH/DME history. Patient stated he resides with his cousin and a roommate, both of whom could provide transportation resources at discharge. Hospitalist stated that patient no longer needs IVC. IVC order was discontinued by hospitalist. CSW consulted psychiatry team for form needed to rescind IVC.  4:54 PM IVC Resinded through [place]  (Envelope #[id number]; Case [id number]). CSW added Notice of Change in Commitment form to patient's hard chart.  Expected Discharge Plan: Home/Self Care Barriers to Discharge: Continued Medical Work up   Patient Goals and CMS Choice Patient states their goals for this hospitalization and ongoing recovery are:: to return home          Expected Discharge Plan and Services                                              Prior Living Arrangements/Services   Lives with:: Roommate, Relatives Patient language and need for interpreter reviewed:: Yes Do you feel safe going back to the place where you live?: Yes      Need for Family Participation in Patient Care: No (Comment) Care giver support system in place?: Yes (comment)   Criminal Activity/Legal Involvement Pertinent to Current Situation/Hospitalization: No - Comment as needed  Activities of Daily Living   ADL Screening  (condition at time of admission) Independently performs ADLs?: Yes (appropriate for developmental age) Is the patient deaf or have difficulty hearing?: No Does the patient have difficulty seeing, even when wearing glasses/contacts?: No Does the patient have difficulty concentrating, remembering, or making decisions?: No  Permission Sought/Granted Permission sought to share information with : Other (comment) (Friend) Permission granted to share information with : No (Contact information on chart)  Share Information with NAME: Richrd Char     Permission granted to share info w Relationship: Friend  Permission granted to share info w Contact Information: [phone]  Emotional Assessment Appearance:: Appears stated age Attitude/Demeanor/Rapport: Engaged Affect (typically observed): Accepting, Adaptable, Stable, Appropriate, Calm, Pleasant Orientation: : Oriented to Self (Memory Impairment) Alcohol / Substance Use: Not Applicable Psych Involvement: No (comment)  Admission diagnosis:  Seizure (HCC) [R56.9] Seizures (HCC) [R56.9] Hypoglycemia [E16.2] ESRD (end stage renal disease) on dialysis (HCC) [N18.6, Z99.2] Acute encephalopathy [G93.40] Altered mental status, unspecified altered mental status type [R41.82] Acute metabolic encephalopathy [G93.41] Patient Active Problem List   Diagnosis Date Noted   Acute encephalopathy 10/21/2023   Acute metabolic encephalopathy 10/21/2023   History of stroke 08/01/2023   Cerebrovascular accident (CVA) due to embolism of cerebral artery (HCC) 04/04/2023   Seizure (HCC) 01/19/2023   Acute respiratory failure with hypoxia (HCC) 01/19/2023   Status epilepticus (HCC) 08/10/2022   ESRD on hemodialysis (HCC)  02/15/2022   Other disorders of phosphorus metabolism 08/18/2021   Fluid overload, unspecified 05/10/2021   Allergy, unspecified, initial encounter 04/19/2021   Anaphylactic shock, unspecified, sequela 04/19/2021   Pruritus, unspecified  04/19/2021   Hypocalcemia 04/01/2021   Long-term current use of anticonvulsant 08/04/2020   COVID-19 07/26/2020   Breakthrough seizure (HCC) 07/20/2020   Acute respiratory failure with hypoxia (HCC)    Lupus    Seizure, late effect of stroke (HCC) 08/26/2019   Secondary hyperparathyroidism of renal origin (HCC) 11/05/2018   Anemia in chronic kidney disease 09/23/2018   ESRD (end stage renal disease) (HCC) 03/18/2018   Prediabetes 01/10/2018   Chest pain in adult    CKD (chronic kidney disease) stage 3, GFR 30-59 ml/min (HCC) 10/24/2015   Seizures (HCC) 10/24/2015   HTN (hypertension) 01/12/2015   Chronic anticoagulation 04/24/2011   Lupus anticoagulant positive 04/24/2011   OBESITY, NOS 08/15/2006   Thrombocytopenia (HCC) 08/15/2006   TOBACCO DEPENDENCE 08/15/2006   CVA 08/15/2006   PCP:  Collins Dean, NP Pharmacy:   [id number] [place], [place] - [address] AT [facility] OF [address] & [address] [place] [address] Phone: [phone] Fax: [phone]  [facility] [address] [place] [address] Phone: [phone] Fax: [phone]  [facility] - [address], [place] - [address] [place] [address] Phone: [phone] Fax: [phone]  [facility] - [place]  - [place], [place] - [address] [place] [address] Phone: [phone] Fax: [phone]     Social Drivers of Health (SDOH) Social History: SDOH Screenings   Food Insecurity: No Food Insecurity (10/21/2023)  Housing: High Risk (10/21/2023)  Transportation Needs: No Transportation Needs (10/21/2023)  Utilities: Not At Risk (10/21/2023)  Alcohol Screen: Low Risk  (03/26/2023)  Depression (PHQ2-9): Low Risk  (03/26/2023)  Financial Resource Strain: Low Risk  (03/26/2023)  Physical Activity: Inactive (03/26/2023)  Social Connections: Moderately  Isolated (10/21/2023)  Stress: No Stress Concern Present (03/26/2023)  Tobacco Use: High Risk (10/21/2023)  Health Literacy: Adequate Health Literacy (03/26/2023)   SDOH Interventions: Housing Interventions: Community Resources Provided, Inpatient TOC   Readmission Risk Interventions    03/28/2023    2:57 PM 01/25/2023    3:40 PM  Readmission Risk Prevention Plan  Post Dischage Appt    Medication Screening    Transportation Screening Complete   Medication Review (RN Care Manager) Referral to Pharmacy   PCP or Specialist appointment within 3-5 days of discharge Complete   HRI or Home Care Consult Complete   SW Recovery Care/Counseling Consult Complete   Palliative Care Screening Not Applicable   Skilled Nursing Facility Not Applicable      Information is confidential and restricted. Go to Review Flowsheets to unlock data.

## 2023-10-24 ENCOUNTER — Inpatient Hospital Stay (HOSPITAL_COMMUNITY)
Admit: 2023-10-24 | Discharge: 2023-10-24 | Disposition: A | Discharge status: Home / Self Care | Attending: Internal Medicine | Admitting: Internal Medicine

## 2023-10-24 DIAGNOSIS — R410 Disorientation, unspecified: Secondary | ICD-10-CM | POA: Diagnosis not present

## 2023-10-24 DIAGNOSIS — N186 End stage renal disease: Secondary | ICD-10-CM | POA: Diagnosis not present

## 2023-10-24 DIAGNOSIS — I120 Hypertensive chronic kidney disease with stage 5 chronic kidney disease or end stage renal disease: Secondary | ICD-10-CM | POA: Diagnosis not present

## 2023-10-24 DIAGNOSIS — G9340 Encephalopathy, unspecified: Secondary | ICD-10-CM | POA: Diagnosis not present

## 2023-10-24 LAB — BASIC METABOLIC PANEL WITH GFR
Anion gap: 17 — ABNORMAL HIGH (ref 5–15)
BUN: 33 mg/dL — ABNORMAL HIGH (ref 6–20)
CO2: 22 mmol/L (ref 22–32)
Calcium: 9.4 mg/dL (ref 8.9–10.3)
Chloride: 96 mmol/L — ABNORMAL LOW (ref 98–111)
Creatinine, Ser: 13.33 mg/dL — ABNORMAL HIGH (ref 0.61–1.24)
GFR, Estimated: 4 mL/min — ABNORMAL LOW (ref >60)
Glucose, Bld: 92 mg/dL (ref 70–99)
Potassium: 4.5 mmol/L (ref 3.5–5.1)
Sodium: 135 mmol/L (ref 135–145)

## 2023-10-24 NOTE — Progress Notes (Signed)
 Progress Note   Patient: [name] [name] DOB: Feb 13, 1981 DOA: 10/20/2023     3 DOS: the patient was seen and examined on 10/24/2023   Brief hospital course: 43 y.o. male with history of ESRD on hemodialysis MWF, hypertension, prior stroke, history of seizures, lupus anticoagulant brought to the ER on 5/5 after patient had a seizure.  Patient's friend was at the bedside and his roommate states that patient had a seizure on Oct 18, 2023 and he missed his dialysis due to that.  Later in the evening he had another seizure.  He did go to his dialysis on Saturday the following day.  But due to increased confusion patient was brought to the ER.  Initially in the ER patient signed out AMA but on the way out patient was confused and [facility] Department brought him back.  Per patient's friend patient last took his medication on Oct 17, 2023.    The patient has been seen by neurology and nephrology.  He he has had an EEG is suggestive of moderate diffuse encephalopathy.  Sharp transients were noted in the right frontal region.  Per neurology, "because EEG showed rhythmic slowing as well as sharp transients, would recommend long-term EEG monitoring for further evaluation."  Assessment and Plan:  Acute encephalopathy Initially thought due to postictal state but has persisted.   Psychiatric disorder seems unlikely, given EEG findings. Possible the patient has suffered some hypoxic brain injury related to his seizures. Patient has been seen by neurology who recommended an encephalopathy workup. TSH: WNL. HIV: Nonreactive. B12: 539. RPR, B1 pending. Neurology considering lumbar puncture. -Continue Wernicke's dose thiamine. -Will continue to discuss management with neurology.  Seizure disorder Patient with known seizure disorder. He is on Keppra  at home. Of note, the patient has presented to the ED several times in the past few months with breakthrough seizures. EEG suggestive of  moderate diffuse encephalopathy. MRI showed multiple remote infarcts, no acute changes. Patient has been seen by neurology, who recommended an encephalopathy workup as well as high-dose thiamine. - Continue home Keppra .  ESRD on HD MWF. Nephrology consulted.  Input appreciated. - Continue dialysis per home schedule.  Uncontrolled hypertension Home medications include amlodipine , carvedilol . - Continue home medications.     Subjective: Patient remains cooperative.  Mental status is not back to baseline as patient is not able to carry out any meaningful conversation.    Physical Exam: Vitals:   10/23/23 1410 10/23/23 2041 10/24/23 0516 10/24/23 0809  BP: (!) 173/137 (!) 151/94 (!) 156/97 (!) 166/111  Pulse:  93 91 79  Resp:  15 16 17   Temp:  97.7 F (36.5 C)  98.5 F (36.9 C)  TempSrc:    Axillary  SpO2:  96% 94% 96%  Weight:      Height:       Physical Exam   General: Alert, oriented X2 Eyes: Pupils equal, reactive  Oral cavity: moist mucous membranes  Head: Atraumatic, normocephalic  Neck: supple  Chest: clear to auscultation. No crackles, no wheezes  CVS: S1,S2 RRR. No murmurs  Abd: No distention, soft, non-tender. No masses palpable  Extr: No edema   MSK: No joint deformities or swelling  Neurological: Grossly intact.    Data Reviewed:     Latest Ref Rng & Units 10/23/2023    8:30 AM 10/21/2023    2:10 PM 10/20/2023    5:50 PM  CBC  WBC 4.0 - 10.5 K/uL 8.2  7.3  7.6   Hemoglobin  13.0 - 17.0 g/dL 16.1  09.6  04.5   Hematocrit 39.0 - 52.0 % 35.2  31.9  33.7   Platelets 150 - 400 K/uL 144  117  110       Latest Ref Rng & Units 10/24/2023    5:59 AM 10/23/2023    8:32 PM 10/23/2023    8:30 AM  BMP  Glucose 70 - 99 mg/dL 92  89  93   BUN 6 - 20 mg/dL 33  27  46   Creatinine 0.61 - 1.24 mg/dL 40.98  11.91  47.82   Sodium 135 - 145 mmol/L 135  136  139   Potassium 3.5 - 5.1 mmol/L 4.5  4.5  4.1   Chloride 98 - 111 mmol/L 96  93  99   CO2 22 - 32 mmol/L 22  24  26     Calcium  8.9 - 10.3 mg/dL 9.4  95.6  9.6      Family Communication: n/a  Disposition: Status is: Inpatient Remains inpatient appropriate because: Ongoing encephalopathy   Planned Discharge Destination: Home  DVT PPX: systemic anticoagulation with Apixaban     Time spent: 35 minutes  Author: MDALA-GAUSI, Shay Bartoli AGATHA, MD 10/24/2023 2:17 PM  For on call review [email or web address].

## 2023-10-24 NOTE — Progress Notes (Signed)
 NEUROLOGY FOLLOW UP NOTE   Date of service: Oct 24, 2023 Patient Name: [name] [name] MRN:  [id number] DOB:  1981-01-06 Chief Complaint: "Acute Encephalopathy " Requesting Provider: Melonie Square, Masiku Agat*  Interval HP/Subjective  [name] [name] is a 43 y.o. male with hx of ESRD on hemodialysis, hypertension, prior stroke, history of seizures, lupus anticoagulant on Eiquis was brought to the ER for seizure and altered mental status.  Recent Interval of events:  Patient previously reported to the emergency department multiple times within the last few days for recurrent seizures and altered mental status. Placed IVC due to lack of capacity to make decision. CT imaging was negative for acute process.  He was loaded with 2 g of Keppra . Labs were otherwise unremarkable. Keppra  level elevated at 47.4, collected shortly after keppra  load was given. Friend notes medication non-compliance due to forgetting to take medicines.   On assessment today,patient only oriented to person. Patient continues to be confused and is unable to state his appropriate age, month or year. He is oriented to person. Still able to follow some commands with variable effort.      ROS  Unable to assess due to patients mentation and cooperation   Past History   Past Medical History:  Diagnosis Date   ESRD on hemodialysis (HCC)    Hypertension    Lupus    Seizures (HCC)    Seizures (HCC)    Stroke (HCC)    13 or 43 years of age.     Wears glasses     Past Surgical History:  Procedure Laterality Date   AV FISTULA PLACEMENT Left 12/30/2017   Procedure: ARTERIOVENOUS (AV) FISTULA CREATION VERSUS INSERTION OF ARTERIOVENOUS GRAFT LEFT ARM;  Surgeon: Mayo Speck, MD;  Location: [facility] OR;  Service: Vascular;  Laterality: Left;   BASCILIC VEIN TRANSPOSITION Left 08/15/2018   Procedure: LEFT FIRST STAGE BASCILIC VEIN TRANSPOSITION;  Surgeon: Mayo Speck, MD;  Location: [facility] OR;  Service: Vascular;  Laterality: Left;    BASCILIC VEIN TRANSPOSITION Left 10/30/2018   Procedure: INSERTION OF GORE-TEX GRAFT LEFT UPPER ARM;  Surgeon: Adine Hoof, MD;  [facility] OR;  Service: Vascular;  Laterality: Left;   INSERTION OF DIALYSIS CATHETER N/A 09/20/2018   Procedure: INSERTION OF TUNNELED DIALYSIS CATHETER RIGHT INTERNAL JUGULAR;  Surgeon: Young Hensen, MD;  Location: [facility] OR;  Service: Vascular;  Laterality: N/A;   IR FLUORO GUIDE CV LINE RIGHT  07/16/2022   IR US  GUIDE VASC ACCESS RIGHT  07/16/2022   RENAL BIOPSY      Family History: Family History  Problem Relation Age of Onset   Diabetes Mother    Diabetes Maternal Grandmother    Seizures Neg Hx        not on mother's side. he doesn't know about his father's side    Social History  reports that he has been smoking e-cigarettes. He has never used smokeless tobacco. He reports that he does not currently use alcohol. He reports that he does not use drugs.  Allergies  Allergen Reactions   Chlorhexidine  Itching   Cetirizine & Related Swelling    Medications   Current Facility-Administered Medications:    amLODipine  (NORVASC ) tablet 10 mg, 10 mg, Oral, QHS, Kakrakandy, Arshad N, MD, 10 mg at 10/23/23 2221   apixaban  (ELIQUIS ) tablet 2.5 mg, 2.5 mg, Oral, BID, Angelene Kelly, MD, 2.5 mg at 10/23/23 2221   atorvastatin  (LIPITOR) tablet 40 mg, 40 mg, Oral, Daily, Angelene Kelly, MD,  40 mg at 10/23/23 1310   calcitRIOL  (ROCALTROL ) capsule 2.5 mcg, 2.5 mcg, Oral, Q M,W,F-HD, Jadene Maxwell E, NP, 2.5 mcg at 10/23/23 1314   carvedilol  (COREG ) tablet 6.25 mg, 6.25 mg, Oral, BID WC, Kakrakandy, Arshad N, MD, 6.25 mg at 10/23/23 1642   cinacalcet  (SENSIPAR ) tablet 150 mg, 150 mg, Oral, Q M,W,F-HD, Jadene Maxwell E, NP, 150 mg at 10/23/23 1313   heparin  sodium (porcine) injection 3,200 Units, 3,200 Units, Intracatheter, Once, Baron Border, MD   hydrALAZINE  (APRESOLINE ) injection 5 mg, 5 mg, Intravenous, Q6H PRN, Reesa Cannon N, DO, 5 mg at 10/22/23 1003   levETIRAcetam  (KEPPRA ) tablet 500 mg, 500 mg, Oral, BID, Angelene Kelly, MD, 500 mg at 10/23/23 2221   multivitamin (RENA-VIT) tablet 1 tablet, 1 tablet, Oral, QHS, Angelene Kelly, MD, 1 tablet at 10/23/23 2221   sevelamer  carbonate (RENVELA ) tablet 3,200 mg, 3,200 mg, Oral, TID WC, Angelene Kelly, MD   sodium chloride  flush (NS) 0.9 % injection 10-40 mL, 10-40 mL, Intracatheter, Q12H, Mdala-Gausi, Masiku Agatha, MD, 10 mL at 10/22/23 0831   sodium chloride  flush (NS) 0.9 % injection 10-40 mL, 10-40 mL, Intracatheter, PRN, Mdala-Gausi, Masiku Agatha, MD   thiamine (VITAMIN B1) 500 mg in sodium chloride  0.9 % 50 mL IVPB, 500 mg, Intravenous, TID, Last Rate: 110 mL/hr at 10/23/23 2338, 500 mg at 10/23/23 2338 **FOLLOWED BY** [START ON 10/25/2023] thiamine (VITAMIN B1) 250 mg in sodium chloride  0.9 % 50 mL IVPB, 250 mg, Intravenous, Q24H **FOLLOWED BY** [START ON 10/30/2023] thiamine (VITAMIN B1) tablet 100 mg, 100 mg, Oral, Daily, Mdala-Gausi, Masiku Lavern Potash, MD  Vitals   Vitals:   10/23/23 1410 10/23/23 2041 10/24/23 0516 10/24/23 0809  BP: (!) 173/137 (!) 151/94 (!) 156/97 (!) 166/111  Pulse:  93 91 79  Resp:  15 16 17   Temp:  97.7 F (36.5 C)  98.5 F (36.9 C)  TempSrc:    Axillary  SpO2:  96% 94% 96%  Weight:      Height:        Body mass index is 34.56 kg/m.  Physical Exam   Constitutional: Appears well-developed, well-nourishe Psych: Affect appropriate to situation.  Eyes: No scleral injection.  HENT: No OP obstruction.  Head: Normocephalic. Cardiovascular: Normal rate and regular rhythm.  Respiratory: Effort normal, non-labored breathing.  GI: Soft.  No distension. There is no tenderness.  Skin: WDI.   Neurologic Examination   Neurological Exam:  Mental status/Cognition:  Alert, arousable, oriented to person. Disoriented to age, month, year. Pt has waning attention and concentration. Pt is able to follow  intermittently able to follow step commands, some amnesia.   Speech/language: Patient does not have dysarthria, normal volume, normal fluency  Cranial nerves:   CN II Pupils equal and reactive to light, pt blinks to threat bilaterally.   CN III,IV,VI EOM intact, no gaze preference or deviation, no nystagmus   CN V normal sensation in V1, V2, and V3 segments bilaterally   CN VII no asymmetry, no nasolabial fold flattening   CN VIII Makes eye contact to speech.   CN IX & X normal palatal elevation, no uvular deviation   CN XI 5/5 head turn and 5/5 shoulder shrug bilaterally   CN XII midline tongue protrusion    Motor:  Muscle bulk: Normal, tone normal  Strength Testing Left Right  Head turn 5/5 5/5  Shoulder shrug (C5) 5/5 5/5  Hand grip (C8-T1) 5/5 5/5  Deltoid Up (C5-C6) 5/5 5/5  Deltoid  Down (C5-C6) 5/5 5/5  Push (C7) 5/5 5/5  Pull (C5-C6) 5/5 5/5  Thigh Raise (L4-S2) 4/5 5/5  Knee Extension (L3-L4) 4/5 5/5  Knee Flexion (L5-S1) 4/5 5/5  Ankle Dorsiflexion (L4-L5) 4/5 5/5  Ankle Plantarflexion (S1-S2) 4/5 5/5        Sensation: Intact throughout to light touch equal in all extremities   Coordination/Complex Motor:  - Gait: Deferred  Labs/Imaging/Neurodiagnostic studies   CBC:  Recent Labs  Lab Nov 05, 2023 1750 10/21/23 1410 10/23/23 0830  WBC 7.6 7.3 8.2  NEUTROABS 5.4 5.2  --   HGB 11.1* 10.3* 11.5*  HCT 33.7* 31.9* 35.2*  MCV 93.9 94.9 92.9  PLT 110* 117* 144*   Basic Metabolic Panel:  Lab Results  Component Value Date   NA 135 10/24/2023   K 4.5 10/24/2023   CO2 22 10/24/2023   GLUCOSE 92 10/24/2023   BUN 33 (H) 10/24/2023   CREATININE 13.33 (H) 10/24/2023   CALCIUM  9.4 10/24/2023   GFRNONAA 4 (L) 10/24/2023   GFRAA 4 (L) 01/14/2020   Lipid Panel:  Lab Results  Component Value Date   LDLCALC 105 (H) 01/27/2023   HgbA1c:  Lab Results  Component Value Date   HGBA1C 5.2 01/19/2023   Urine Drug Screen:     Component Value Date/Time   LABOPIA  NONE DETECTED 02/15/2009 1435   COCAINSCRNUR NONE DETECTED 02/15/2009 1435   LABBENZ NONE DETECTED 02/15/2009 1435   AMPHETMU NONE DETECTED 02/15/2009 1435   THCU NONE DETECTED 02/15/2009 1435   LABBARB  02/15/2009 1435    NONE DETECTED        DRUG SCREEN FOR MEDICAL PURPOSES ONLY.  IF CONFIRMATION IS NEEDED FOR ANY PURPOSE, NOTIFY LAB WITHIN 5 DAYS.        LOWEST DETECTABLE LIMITS FOR URINE DRUG SCREEN Drug Class       Cutoff (ng/mL) Amphetamine      1000 Barbiturate      200 Benzodiazepine   200 Tricyclics       300 Opiates          300 Cocaine          300 THC              50    Alcohol Level     Component Value Date/Time   ETH <15 10/21/2023 0415   INR  Lab Results  Component Value Date   INR 2.5 (H) 04/20/2023   APTT  Lab Results  Component Value Date   APTT 57 (H) 09/20/2018   AED levels:  Lab Results  Component Value Date   PHENYTOIN  <2.5 REPEATED TO VERIFY (L) 06/09/2010   LAMOTRIGINE  <1.0 (L) 08/01/2023   LEVETIRACETA 47.4 (H) 10/21/2023   CT Head without contrast(Personally reviewed): Nov 05, 2023 1. No acute intracranial abnormality. Multiple chronic infarcts.   MRI Brain:(5/6) 1. No evidence of acute intracranial abnormality. 2. Multiple remote infarcts and advanced chronic microvascular ischemic disease.  Neurodiagnostics rEEG: 5/5 report  This study is suggestive of moderate diffuse encephalopathy.  Sharp transients were noted in the right frontal region.     Because EEG showed rhythmic slowing as well as sharp transients, would recommend long-term EEG monitoring for further evaluation.  LTM EEG: 5/6 report  This study is suggestive of moderate diffuse encephalopathy. No seizures were noted.   ASSESSMENT   DUNG COULBOURN is a 43 y.o. male with hx of ESRD on hemodialysis, hypertension, prior stroke, history of seizures, lupus anticoagulant on Eiquis was brought to the ER  for seizure and altered mental status.  Suspect patient symptoms could be  in the context of inconsistent medication compliance with Keppra . Routine EEG from 5/5 did show some sharp transients and was suggestive of moderate diffuse encephalopathy. Long-term EEG 5/6 negative for seizures. He continues to be confused throughout exam which is different from baseline per friends history. Lab work has been rather unremarkable, BUN is slightly elevated this morning after dialysis yesterday. Suspicion for infection is low, patient has been afebrile and has not demonstrated leukocytosis on any labs collected at this admission. Could consider an LP if that changes.  Given patient's persistent confusion, we will restart long-term EEG to monitor for seizures.  Based on EEG results, could consider adjusting AED medication regimen.   RECOMMENDATIONS  - LTM EEG  - Continue with Keppra  at 500 mg twice daily - Additional labs for encephalopathic workup WNL include TSH, HIV, syphilis, RPR, Vitamin B9, vitamin B12 - F/u Vitamin B1 - F/u carbon monoxide lab - Continue Thiamine 100 mg daily  ______________________________________________________________________  Signed, DONOVAN RAINEY, MD [facility]- PGY-1  NEUROHOSPITALIST ADDENDUM Performed a face to face diagnostic evaluation.   I have reviewed the contents of history and physical exam as documented by PA/ARNP/Resident and agree with above documentation.  I have discussed and formulated the above plan as documented. Edits to the note have been made as needed.  Impression/Key exam findings/Plan:remains confused so will put him up on LTM EEG to see if it shows any seizures. Consider LP if LTM EEG negative.  Niklas Chretien, MD [facility] [phone]   If 7pm to 7am, please call on call as listed on AMION.

## 2023-10-24 NOTE — Progress Notes (Signed)
 LTM EEG hooked up and running - no initial skin breakdown - push button tested - [facility] monitoring.

## 2023-10-24 NOTE — Progress Notes (Signed)
 [facility] Progress Note   Subjective:    Seen and examined patient at bedside. Tolerated yesterday's HD with net UF 4.3L. He's awake and appears more oriented today. Next HD 5/9.  Objective Vitals:   10/23/23 1410 10/23/23 2041 10/24/23 0516 10/24/23 0809  BP: (!) 173/137 (!) 151/94 (!) 156/97 (!) 166/111  Pulse:  93 91 79  Resp:  15 16 17   Temp:  97.7 F (36.5 C)  98.5 F (36.9 C)  TempSrc:    Axillary  SpO2:  96% 94% 96%  Weight:      Height:       Physical Exam General: Awake, oriented to self and location; NAD Heart:S1 and S2; No murmurs, gallops, and rubs Lungs: Clear throughout Abdomen: Soft and non-tender Extremities: no LE edema Dialysis Access: R [facility]   Filed Weights   10/21/23 1348 10/21/23 1830 10/23/23 0808  Weight: 119.3 kg 116.5 kg 115.6 kg    Intake/Output Summary (Last 24 hours) at 10/24/2023 1036 Last data filed at 10/24/2023 0400 Gross per 24 hour  Intake 292.6 ml  Output 4.3 ml  Net 288.3 ml    Additional Objective Labs: Basic Metabolic Panel: Recent Labs  Lab 10/23/23 0830 10/23/23 2032 10/24/23 0559  NA 139 136 135  K 4.1 4.5 4.5  CL 99 93* 96*  CO2 26 24 22   GLUCOSE 93 89 92  BUN 46* 27* 33*  CREATININE 17.76* 12.18* 13.33*  CALCIUM  9.6 10.0 9.4  PHOS 7.6*  --   --    Liver Function Tests: Recent Labs  Lab 10/18/23 0803 10/20/23 1750 10/21/23 1410 10/23/23 0830  AST 16 18 16   --   ALT 12 13 12   --   ALKPHOS 59 60 50  --   BILITOT 0.7 1.0 0.9  --   PROT 6.8 7.5 6.8  --   ALBUMIN  3.4* 3.5 3.2* 3.3*   No results for input(s): "LIPASE", "AMYLASE" in the last 168 hours. CBC: Recent Labs  Lab 10/18/23 0803 10/20/23 1750 10/21/23 1410 10/23/23 0830  WBC 8.5 7.6 7.3 8.2  NEUTROABS 6.7 5.4 5.2  --   HGB 10.6* 11.1* 10.3* 11.5*  HCT 32.4* 33.7* 31.9* 35.2*  MCV 94.7 93.9 94.9 92.9  PLT 92* 110* 117* 144*   Blood Culture    Component Value Date/Time   SDES BLOOD RIGHT HAND 01/19/2023 0213    SPECREQUEST  01/19/2023 1191    BOTTLES DRAWN AEROBIC AND ANAEROBIC Blood Culture results may not be optimal due to an excessive volume of blood received in culture bottles   CULT  01/19/2023 0213    NO GROWTH 5 DAYS Performed at [facility], [address] [place], [place] [address]    REPTSTATUS 01/24/2023 FINAL 01/19/2023 5621    Cardiac Enzymes: No results for input(s): "CKTOTAL", "CKMB", "CKMBINDEX", "TROPONINI" in the last 168 hours. CBG: Recent Labs  Lab 10/18/23 0756 10/20/23 1735 10/20/23 1945 10/20/23 2042 10/22/23 1123  GLUCAP 75 89 68* 92 94   Iron  Studies: No results for input(s): "IRON ", "TIBC", "TRANSFERRIN", "FERRITIN" in the last 72 hours. Lab Results  Component Value Date   INR 2.5 (H) 04/20/2023   INR 2.6 (H) 04/19/2023   INR 2.7 (H) 04/18/2023   PROTIME 28.8 (H) 11/04/2013   PROTIME 21.6 (H) 10/14/2013   PROTIME 18.0 (H) 09/23/2013   Studies/Results: MR BRAIN WO CONTRAST Result Date: 10/22/2023 CLINICAL DATA:  Neuro deficit, acute, stroke suspected EXAM: MRI HEAD WITHOUT CONTRAST TECHNIQUE: Multiplanar, multiecho pulse sequences  of the brain and surrounding structures were obtained without intravenous contrast. COMPARISON:  CT head May 4, 25. FINDINGS: Motion limited study.  Within this limitation: Brain: No acute infarction, hemorrhage, hydrocephalus, extra-axial collection or mass lesion. Remote bilateral cerebellar infarcts. Remote left frontoparietal and bilateral occipital infarcts. Advanced patchy and confluent T2/FLAIR hyperintensity in the white matter, compatible with chronic microvascular ischemic disease. Vascular: Major arterial flow voids are maintained at the skull base. Skull and upper cervical spine: Normal marrow signal. Sinuses/Orbits: Clear sinuses.  No acute orbital findings. IMPRESSION: 1. No evidence of acute intracranial abnormality. 2. Multiple remote infarcts and advanced chronic microvascular ischemic disease. Electronically Signed    By: Stevenson Elbe M.D.   On: 10/22/2023 19:57    Medications:  thiamine (VITAMIN B1) injection 500 mg (10/23/23 2338)   Followed by   Cecily Cohen ON 10/25/2023] thiamine (VITAMIN B1) injection      amLODipine   10 mg Oral QHS   apixaban   2.5 mg Oral BID   atorvastatin   40 mg Oral Daily   calcitRIOL   2.5 mcg Oral Q M,W,F-HD   carvedilol   6.25 mg Oral BID WC   cinacalcet   150 mg Oral Q M,W,F-HD   heparin  sodium (porcine)  3,200 Units Intracatheter Once   levETIRAcetam   500 mg Oral BID   multivitamin  1 tablet Oral QHS   sevelamer  carbonate  3,200 mg Oral TID WC   sodium chloride  flush  10-40 mL Intracatheter Q12H   [START ON 10/30/2023] thiamine  100 mg Oral Daily    Dialysis Orders: MWF - [facility] 4hrs, BFR 400, DFR 800,  EDW 110.5kg, 2K/ 2Ca Heparin : No bolus ordered Mircera: None ordered Calcitriol  2.5mcg PO qHD - last dose 10/19/23 Sensipar  150mg  with HD - last dose 10/19/23  Assessment/Plan: Seizure disorder - Neurology following; EEG suggestive of moderate diffuse encephalopathy, no seizures noted. and MRI brain no acute changes, just chronic. Received loading dose of IV Keppra . Acute encephalopathy - Likely postictal, see above.  Does seem to be improving but it's been slow lasting days. ESRD -  on HD MWF. Next HD 5/9  Hypertension/volume  - Blood pressures mainly up likely 2nd hypervolemic. He's nowhere near his EDW in outpatient and occasionally refuses extra treatments. Push UF here as tolerated. Resume home BP meds. Anemia of CKD - Current Hgb at goal. No Fe/ESA is indicated at this time. Secondary Hyperparathyroidism -  Resume Calcitriol  and Sensipar  Nutrition - Renal diet with fluid restriction  Jadene Maxwell, NP [facility] 10/24/2023,10:36 AM  LOS: 3 days

## 2023-10-24 NOTE — Progress Notes (Addendum)
 Pt BP reading elevated 137/101. Refusing BP medication PO Coreg . Nightshift RN made aware to f/u MD for possible IV-antihypertensive  Pt has poor oral intake. Pt did not eat breakfast, lunch, or dinner with minimal fluid intake. Pt pockets pills and needs fq reminders to swallow what's in his mouth. Pt would greatly benefit having speech consult.  RN placed order.  Masiku-Agatha MD notified of above information via secure chat.   RN set up suctioning, and oxygen @ bedside. Secretary informed to order bed-padding

## 2023-10-25 ENCOUNTER — Encounter (HOSPITAL_COMMUNITY): Payer: Self-pay

## 2023-10-25 ENCOUNTER — Encounter (HOSPITAL_COMMUNITY)

## 2023-10-25 ENCOUNTER — Inpatient Hospital Stay (HOSPITAL_COMMUNITY)

## 2023-10-25 DIAGNOSIS — G9340 Encephalopathy, unspecified: Secondary | ICD-10-CM | POA: Diagnosis not present

## 2023-10-25 DIAGNOSIS — R4182 Altered mental status, unspecified: Secondary | ICD-10-CM | POA: Diagnosis not present

## 2023-10-25 DIAGNOSIS — I120 Hypertensive chronic kidney disease with stage 5 chronic kidney disease or end stage renal disease: Secondary | ICD-10-CM | POA: Diagnosis not present

## 2023-10-25 DIAGNOSIS — R569 Unspecified convulsions: Secondary | ICD-10-CM | POA: Diagnosis not present

## 2023-10-25 DIAGNOSIS — N186 End stage renal disease: Secondary | ICD-10-CM | POA: Diagnosis not present

## 2023-10-25 LAB — CBC WITH DIFFERENTIAL/PLATELET
Abs Immature Granulocytes: 0.02 10*3/uL (ref 0.00–0.07)
Basophils Absolute: 0.1 10*3/uL (ref 0.0–0.1)
Basophils Relative: 1 %
Eosinophils Absolute: 0.2 10*3/uL (ref 0.0–0.5)
Eosinophils Relative: 2 %
HCT: 38.2 % — ABNORMAL LOW (ref 39.0–52.0)
Hemoglobin: 13.1 g/dL (ref 13.0–17.0)
Immature Granulocytes: 0 %
Lymphocytes Relative: 14 %
Lymphs Abs: 1.5 10*3/uL (ref 0.7–4.0)
MCH: 31.3 pg (ref 26.0–34.0)
MCHC: 34.3 g/dL (ref 30.0–36.0)
MCV: 91.2 fL (ref 80.0–100.0)
Monocytes Absolute: 1.3 10*3/uL — ABNORMAL HIGH (ref 0.1–1.0)
Monocytes Relative: 12 %
Neutro Abs: 7.7 10*3/uL (ref 1.7–7.7)
Neutrophils Relative %: 71 %
Platelets: 126 10*3/uL — ABNORMAL LOW (ref 150–400)
RBC: 4.19 MIL/uL — ABNORMAL LOW (ref 4.22–5.81)
RDW: 13.5 % (ref 11.5–15.5)
WBC: 10.8 10*3/uL — ABNORMAL HIGH (ref 4.0–10.5)
nRBC: 0 % (ref 0.0–0.2)

## 2023-10-25 LAB — CARBON MONOXIDE, BLOOD (PERFORMED AT REF LAB): Carbon Monoxide, Blood: 3.6 % (ref 0.0–3.6)

## 2023-10-25 LAB — BASIC METABOLIC PANEL WITH GFR
Anion gap: 19 — ABNORMAL HIGH (ref 5–15)
BUN: 55 mg/dL — ABNORMAL HIGH (ref 6–20)
CO2: 22 mmol/L (ref 22–32)
Calcium: 9.6 mg/dL (ref 8.9–10.3)
Chloride: 95 mmol/L — ABNORMAL LOW (ref 98–111)
Creatinine, Ser: 17.39 mg/dL — ABNORMAL HIGH (ref 0.61–1.24)
GFR, Estimated: 3 mL/min — ABNORMAL LOW (ref >60)
Glucose, Bld: 108 mg/dL — ABNORMAL HIGH (ref 70–99)
Potassium: 4.1 mmol/L (ref 3.5–5.1)
Sodium: 136 mmol/L (ref 135–145)

## 2023-10-25 LAB — PHOSPHORUS: Phosphorus: 7.5 mg/dL — ABNORMAL HIGH (ref 2.5–4.6)

## 2023-10-25 LAB — VITAMIN B1: Vitamin B1 (Thiamine): 121 nmol/L (ref 66.5–200.0)

## 2023-10-25 MED ORDER — LORAZEPAM 2 MG/ML IJ SOLN: 2.0000 mg | INTRAMUSCULAR | Status: DC | PRN

## 2023-10-25 MED ORDER — LEVETIRACETAM 500 MG PO TABS
500.0000 mg | ORAL_TABLET | ORAL | Status: DC
  Administered 2023-11-02 – 2023-11-09 (×4): 500 mg via ORAL
  Administered 2023-11-11: 250 mg via ORAL
  Filled 2023-10-25 (×5): qty 1

## 2023-10-25 MED ORDER — LIDOCAINE-PRILOCAINE 2.5-2.5 % EX CREA: 1.0000 | TOPICAL_CREAM | CUTANEOUS | Status: DC | PRN

## 2023-10-25 MED ORDER — PENTAFLUOROPROP-TETRAFLUOROETH EX AERO: 1.0000 | INHALATION_SPRAY | CUTANEOUS | Status: DC | PRN

## 2023-10-25 MED ORDER — ALTEPLASE 2 MG IJ SOLR: 2.0000 mg | Freq: Once | INTRAMUSCULAR | Status: DC | PRN

## 2023-10-25 MED ORDER — LEVETIRACETAM (KEPPRA) 500 MG/5 ML ADULT IV PUSH: 500.0000 mg | INTRAVENOUS | Status: DC

## 2023-10-25 MED ORDER — HEPARIN SODIUM (PORCINE) 1000 UNIT/ML IJ SOLN
INTRAMUSCULAR | Status: AC
  Filled 2023-10-25: qty 4

## 2023-10-25 MED ORDER — DIVALPROEX SODIUM 250 MG PO DR TAB
500.0000 mg | DELAYED_RELEASE_TABLET | Freq: Two times a day (BID) | ORAL | Status: DC
  Administered 2023-10-25 – 2023-10-26 (×3): 500 mg via ORAL
  Filled 2023-10-25 (×2): qty 1
  Filled 2023-10-25 (×2): qty 2

## 2023-10-25 MED ORDER — THIAMINE HCL 100 MG/ML IJ SOLN
500.0000 mg | Freq: Once | INTRAVENOUS | Status: AC
  Administered 2023-10-25: 500 mg via INTRAVENOUS

## 2023-10-25 MED ORDER — THIAMINE HCL 100 MG/ML IJ SOLN
250.0000 mg | INTRAVENOUS | Status: DC
  Administered 2023-10-26 – 2023-10-27 (×2): 250 mg via INTRAVENOUS
  Filled 2023-10-25 (×3): qty 2.5

## 2023-10-25 MED ORDER — VALPROATE SODIUM 100 MG/ML IV SOLN
1500.0000 mg | Freq: Once | INTRAVENOUS | Status: AC
  Administered 2023-10-25: 1500 mg via INTRAVENOUS
  Filled 2023-10-25: qty 15

## 2023-10-25 MED ORDER — LIDOCAINE HCL (PF) 1 % IJ SOLN: 5.0000 mL | INTRAMUSCULAR | Status: DC | PRN

## 2023-10-25 MED ORDER — HEPARIN SODIUM (PORCINE) 1000 UNIT/ML DIALYSIS: 1000.0000 [IU] | INTRAMUSCULAR | Status: DC | PRN

## 2023-10-25 MED ORDER — THIAMINE MONONITRATE 100 MG PO TABS: 100.0000 mg | ORAL_TABLET | Freq: Every day | ORAL | Status: DC

## 2023-10-25 NOTE — Evaluation (Addendum)
 Clinical/Bedside Swallow Evaluation  Patient Details Name: [name] [name] MRN: [id number] DOB: 10-Jan-1981 Today's Date: 10/25/2023 Time: 6213-0865 SLP Time Calculation (min) (ACUTE ONLY): 8 min  Problem List:  Patient Active Problem List   Diagnosis Date Noted   Acute encephalopathy 10/21/2023   Acute metabolic encephalopathy 10/21/2023   History of stroke 08/01/2023   Cerebrovascular accident (CVA) due to embolism of cerebral artery (HCC) 04/04/2023   Seizure (HCC) 01/19/2023   Acute respiratory failure with hypoxia (HCC) 01/19/2023   Status epilepticus (HCC) 08/10/2022   ESRD on hemodialysis (HCC) 02/15/2022   Other disorders of phosphorus metabolism 08/18/2021   Fluid overload, unspecified 05/10/2021   Allergy, unspecified, initial encounter 04/19/2021   Anaphylactic shock, unspecified, sequela 04/19/2021   Pruritus, unspecified 04/19/2021   Hypocalcemia 04/01/2021   Long-term current use of anticonvulsant 08/04/2020   COVID-19 07/26/2020   Breakthrough seizure (HCC) 07/20/2020   Acute respiratory failure with hypoxia (HCC)    Lupus    Seizure, late effect of stroke (HCC) 08/26/2019   Secondary hyperparathyroidism of renal origin (HCC) 11/05/2018   Anemia in chronic kidney disease 09/23/2018   ESRD (end stage renal disease) (HCC) 03/18/2018   Prediabetes 01/10/2018   Chest pain in adult    CKD (chronic kidney disease) stage 3, GFR 30-59 ml/min (HCC) 10/24/2015   Seizures (HCC) 10/24/2015   HTN (hypertension) 01/12/2015   Chronic anticoagulation 04/24/2011   Lupus anticoagulant positive 04/24/2011   OBESITY, NOS 08/15/2006   Thrombocytopenia (HCC) 08/15/2006   TOBACCO DEPENDENCE 08/15/2006   CVA 08/15/2006   Past Medical History:  Past Medical History:  Diagnosis Date   ESRD on hemodialysis (HCC)    Hypertension    Lupus    Seizures (HCC)    Seizures (HCC)    Stroke (HCC)    29 or 43 years of age.     Wears glasses    Past Surgical History:  Past Surgical  History:  Procedure Laterality Date   AV FISTULA PLACEMENT Left 12/30/2017   Procedure: ARTERIOVENOUS (AV) FISTULA CREATION VERSUS INSERTION OF ARTERIOVENOUS GRAFT LEFT ARM;  Surgeon: Mayo Speck, MD;  Location: [facility] OR;  Service: Vascular;  Laterality: Left;   BASCILIC VEIN TRANSPOSITION Left 08/15/2018   Procedure: LEFT FIRST STAGE BASCILIC VEIN TRANSPOSITION;  Surgeon: Mayo Speck, MD;  Location: [facility] OR;  Service: Vascular;  Laterality: Left;   BASCILIC VEIN TRANSPOSITION Left 10/30/2018   Procedure: INSERTION OF GORE-TEX GRAFT LEFT UPPER ARM;  Surgeon: Adine Hoof, MD;  Location: [facility] OR;  Service: Vascular;  Laterality: Left;   INSERTION OF DIALYSIS CATHETER N/A 09/20/2018   Procedure: INSERTION OF TUNNELED DIALYSIS CATHETER RIGHT INTERNAL JUGULAR;  Surgeon: Young Hensen, MD;  Location: [facility] OR;  Service: Vascular;  Laterality: N/A;   IR FLUORO GUIDE CV LINE RIGHT  07/16/2022   IR US  GUIDE VASC ACCESS RIGHT  07/16/2022   RENAL BIOPSY     HPI:  [name] [name] is a 43 yo male presenting to ED 5/5 after having a seizure 5/2 and missing HD. He initially left AMA but returned. LTM EEG showed moderate diffuse encephalopathy and one seizure without clinical signs arising from the R frontal region. Seen by SLP 03/2023 targeting goals related to problem solving, selective attention, and auditory comprehension. PMH includes ESRD on HD, HTN, prior stroke, seizures, lupus   Assessment / Plan / Recommendation Clinical Impression  Per RN, pt has requires extensvie cueing to initiate oral transit with resulting buccal pocketing of medications. Pt is  encephalopathic and requires explicit cueing for initiation of command following. He presents with what is suspected to be a cognitively based dysphagia but there are no overt s/s of aspiration with thin liquids or solids. Once he initiates opening his mouth or sipping through a straw, oral transit was observed to be swift and complete. Suspect  current mentation may inhibit adequate PO intake but as long as pt is awake/alert and receiving assistance with feeding, feel he can continue his current diet. Discussed with RN and recommended trying pills with applesauce as needed. SLP will f/u at least briefly for further assessment as mentation improves.    SLP Assessment  SLP Visit Diagnosis: Dysphagia, unspecified (R13.10)    Recommendations for follow up therapy are one component of a multi-disciplinary discharge planning process, led by the attending physician.  Recommendations may be updated based on patient status, additional functional criteria and insurance authorization.    Follow Up Recommendations  No SLP follow up    Assistance Recommended at Discharge     Functional Status Assessment Patient has had a recent decline in their functional status and demonstrates the ability to make significant improvements in function in a reasonable and predictable amount of time.  Frequency and Duration min 2x/week         SLP Evaluation Cognition  Orientation Level: Oriented to person;Oriented to place;Disoriented to time;Disoriented to situation       Comprehension       Expression     Oral / Motor  Oral Motor/Sensory Function Overall Oral Motor/Sensory Function: Within functional limits            [name], M.A., CCC-SLP Speech Language Pathology, Acute Rehabilitation Services  Secure Chat preferred [phone]  10/25/2023, 3:57 PM

## 2023-10-25 NOTE — Procedures (Addendum)
 Patient Name: [name] [name]  MRN: [id number]  Epilepsy Attending: Arleene Lack  Referring Physician/Provider: Khaliqdina, Salman, MD  Duration: 10/24/2023 1141 to 10/25/2023 0600  Patient history: 43 y.o. male with hx of ESRD on hemodialysis, hypertension, prior stroke, history of seizures, lupus anticoagulant on Eiquis was brought to the ER for seizure and altered mental status. EEG to evaluate for seizure.  Level of alertness: Awake  AEDs during EEG study: LEV  Technical aspects: This EEG study was done with scalp electrodes positioned according to the 10-20 International system of electrode placement. Electrical activity was reviewed with band pass filter of 1-70Hz , sensitivity of 7 uV/mm, display speed of 70mm/sec with a 60Hz  notched filter applied as appropriate. EEG data were recorded continuously and digitally stored.  Video monitoring was available and reviewed as appropriate.  Description: EEG showed continuous generalized high amplitude sharply contoured and at times rhythmic 3 to 6 Hz theta-delta slowing. Hyperventilation and photic stimulation were not performed.   One seizure without clinical signs was noted on 10/24/2023 at 1754. EEG showed rhythmic sharply contoured 4-5hz  theta slowing in right>left frontal region which then evolved into 2-3hz  spike and wave in right fronto-temporal region. This was followed by 5-6hz  high amplitude that slowing in right hemisphere which then also involved left hemisphere. Subsequently eeg evolved into 1-1.5hz  delta slowing. Duration of seizure was about 2 minutes.   Parts of study was technically difficult due to significant electrode artifact. After around 2200, EEG ws not interpretable due to electrode artifact.  ABNORMALITY - Seizure without clinical signs, right frontal region - Continuous slow, generalized  IMPRESSION: This technically difficult study showed one seizure without clinical signs on 10/24/2023 at 1754 arising from right frontal  region, lasting about 2 minutes. Additionally there was moderate diffuse encephalopathy.   [name] [name]

## 2023-10-25 NOTE — Evaluation (Signed)
 Speech Language Pathology Evaluation Patient Details Name: [name] [name] MRN: [id number] DOB: 1980-08-02 Today's Date: 10/25/2023 Time: 4098-1191 SLP Time Calculation (min) (ACUTE ONLY): 8 min  Problem List:  Patient Active Problem List   Diagnosis Date Noted   Acute encephalopathy 10/21/2023   Acute metabolic encephalopathy 10/21/2023   History of stroke 08/01/2023   Cerebrovascular accident (CVA) due to embolism of cerebral artery (HCC) 04/04/2023   Seizure (HCC) 01/19/2023   Acute respiratory failure with hypoxia (HCC) 01/19/2023   Status epilepticus (HCC) 08/10/2022   ESRD on hemodialysis (HCC) 02/15/2022   Other disorders of phosphorus metabolism 08/18/2021   Fluid overload, unspecified 05/10/2021   Allergy, unspecified, initial encounter 04/19/2021   Anaphylactic shock, unspecified, sequela 04/19/2021   Pruritus, unspecified 04/19/2021   Hypocalcemia 04/01/2021   Long-term current use of anticonvulsant 08/04/2020   COVID-19 07/26/2020   Breakthrough seizure (HCC) 07/20/2020   Acute respiratory failure with hypoxia (HCC)    Lupus    Seizure, late effect of stroke (HCC) 08/26/2019   Secondary hyperparathyroidism of renal origin (HCC) 11/05/2018   Anemia in chronic kidney disease 09/23/2018   ESRD (end stage renal disease) (HCC) 03/18/2018   Prediabetes 01/10/2018   Chest pain in adult    CKD (chronic kidney disease) stage 3, GFR 30-59 ml/min (HCC) 10/24/2015   Seizures (HCC) 10/24/2015   HTN (hypertension) 01/12/2015   Chronic anticoagulation 04/24/2011   Lupus anticoagulant positive 04/24/2011   OBESITY, NOS 08/15/2006   Thrombocytopenia (HCC) 08/15/2006   TOBACCO DEPENDENCE 08/15/2006   CVA 08/15/2006   Past Medical History:  Past Medical History:  Diagnosis Date   ESRD on hemodialysis (HCC)    Hypertension    Lupus    Seizures (HCC)    Seizures (HCC)    Stroke (HCC)    44 or 43 years of age.     Wears glasses    Past Surgical History:  Past Surgical  History:  Procedure Laterality Date   AV FISTULA PLACEMENT Left 12/30/2017   Procedure: ARTERIOVENOUS (AV) FISTULA CREATION VERSUS INSERTION OF ARTERIOVENOUS GRAFT LEFT ARM;  Surgeon: Mayo Speck, MD;  Location: [facility] OR;  Service: Vascular;  Laterality: Left;   BASCILIC VEIN TRANSPOSITION Left 08/15/2018   Procedure: LEFT FIRST STAGE BASCILIC VEIN TRANSPOSITION;  Surgeon: Mayo Speck, MD;  Location: [facility] OR;  Service: Vascular;  Laterality: Left;   BASCILIC VEIN TRANSPOSITION Left 10/30/2018   Procedure: INSERTION OF GORE-TEX GRAFT LEFT UPPER ARM;  Surgeon: Adine Hoof, MD;  Location: [facility] OR;  Service: Vascular;  Laterality: Left;   INSERTION OF DIALYSIS CATHETER N/A 09/20/2018   Procedure: INSERTION OF TUNNELED DIALYSIS CATHETER RIGHT INTERNAL JUGULAR;  Surgeon: Young Hensen, MD;  Location: [facility] OR;  Service: Vascular;  Laterality: N/A;   IR FLUORO GUIDE CV LINE RIGHT  07/16/2022   IR US  GUIDE VASC ACCESS RIGHT  07/16/2022   RENAL BIOPSY     HPI:  [name] [name] is a 43 yo male presenting to ED 5/5 after having a seizure 5/2 and missing HD. He initially left AMA but returned. LTM EEG showed moderate diffuse encephalopathy and one seizure without clinical signs arising from the R frontal region. Seen by SLP 03/2023 targeting goals related to problem solving, selective attention, and auditory comprehension. PMH includes ESRD on HD, HTN, prior stroke, seizures, lupus   Assessment / Plan / Recommendation Clinical Impression  Pt currently presents with deficits related to attention, awareness, problem solving, and initiation which were observed during functional tasks.  He requires Mod visual cueing to follow commands. Pt requires cueing to sustain attention to functional tasks and was unable to participate in a digit repetition task when complexity increased to three digits. He has an abnormal affect and limited eye contact, requiring cueing to respond to questions or follow through with  actions he states he can do. His responses are often delayed. Pt has a history of cognitive impairments per SLP notes from 03/2024, although suspect his current presentation may represent an acute change from his baseline. SLP will continue following.    SLP Assessment  SLP Recommendation/Assessment: Patient needs continued Speech Lanaguage Pathology Services SLP Visit Diagnosis: Cognitive communication deficit (R41.841)    Recommendations for follow up therapy are one component of a multi-disciplinary discharge planning process, led by the attending physician.  Recommendations may be updated based on patient status, additional functional criteria and insurance authorization.    Follow Up Recommendations  Other (comment) (TBA)    Assistance Recommended at Discharge  Frequent or constant Supervision/Assistance  Functional Status Assessment Patient has had a recent decline in their functional status and demonstrates the ability to make significant improvements in function in a reasonable and predictable amount of time.  Frequency and Duration min 2x/week  2 weeks      SLP Evaluation Cognition  Overall Cognitive Status: Impaired/Different from baseline Arousal/Alertness: Awake/alert Orientation Level: Oriented to person;Oriented to place;Disoriented to time;Disoriented to situation Attention: Sustained Sustained Attention: Impaired Sustained Attention Impairment: Functional basic Awareness: Impaired Awareness Impairment: Intellectual impairment Problem Solving: Impaired Problem Solving Impairment: Functional basic Executive Function: Initiating Initiating: Impaired Initiating Impairment: Functional basic       Comprehension  Auditory Comprehension Overall Auditory Comprehension: Impaired Yes/No Questions: Not tested Commands: Impaired One Step Basic Commands: 25-49% accurate    Expression Expression Primary Mode of Expression: Verbal Verbal Expression Overall Verbal  Expression: Appears within functional limits for tasks assessed   Oral / Motor  Oral Motor/Sensory Function Overall Oral Motor/Sensory Function: Within functional limits Motor Speech Overall Motor Speech: Appears within functional limits for tasks assessed            [name], M.A., CCC-SLP Speech Language Pathology, Acute Rehabilitation Services  Secure Chat preferred [phone]  10/25/2023, 4:09 PM

## 2023-10-25 NOTE — Progress Notes (Signed)
 Transferred patient to 3W09 from dialysis. Patient is being monitored by [facility].

## 2023-10-25 NOTE — Progress Notes (Signed)
 Received patient to the unit at 1418, VSS. Gave missed medications per pharmacy recommendation, requested missing doses. Telesitter, telemetry, and EEG placed. Patient A/Ox2, pain 0/10.

## 2023-10-25 NOTE — Progress Notes (Signed)
 [facility] Progress Note   Subjective:    Seen and examined patient on HD. Informed by the HD RN of patient having 2 episodes of seizures which started around 11am lasting around . Informed Neurology came to the HD unit and Keppra  was ordered and given. Currently, he's not responding but moving all extremities spontaneously. Rapid response also at the bedside. Plan for patient to be transferred to the progressive care unit. 1.3L was removed and UF will remain off for duration of treatment.   Objective Vitals:   10/25/23 0900 10/25/23 0930 10/25/23 0945 10/25/23 1145  BP: 127/82 (!) 119/90 105/78 (!) 118/100  Pulse:      Resp:      Temp:      TempSrc:      SpO2:      Weight:      Height:       Physical Exam General: Eyes open but not responding (s/p 2 seizure episodes on HD today); NAD Heart:S1 and S2; No murmurs, gallops, and rubs Lungs: Clear anteriorly Abdomen: Soft and non-tender Extremities: no LE edema Neuro: Not responding but moving all extremities spontaneously Dialysis Access: R [facility]    Filed Weights   10/21/23 1830 10/23/23 0808 10/25/23 0843  Weight: 116.5 kg 115.6 kg 112.8 kg   No intake or output data in the 24 hours ending 10/25/23 1217  Additional Objective Labs: Basic Metabolic Panel: Recent Labs  Lab 10/23/23 0830 10/23/23 2032 10/24/23 0559 10/25/23 0646  NA 139 136 135 136  K 4.1 4.5 4.5 4.1  CL 99 93* 96* 95*  CO2 26 24 22 22   GLUCOSE 93 89 92 108*  BUN 46* 27* 33* 55*  CREATININE 17.76* 12.18* 13.33* 17.39*  CALCIUM  9.6 10.0 9.4 9.6  PHOS 7.6*  --   --  7.5*   Liver Function Tests: Recent Labs  Lab 10/20/23 1750 10/21/23 1410 10/23/23 0830  AST 18 16  --   ALT 13 12  --   ALKPHOS 60 50  --   BILITOT 1.0 0.9  --   PROT 7.5 6.8  --   ALBUMIN  3.5 3.2* 3.3*   No results for input(s): "LIPASE", "AMYLASE" in the last 168 hours. CBC: Recent Labs  Lab 10/20/23 1750 10/21/23 1410 10/23/23 0830 10/25/23 0646  WBC  7.6 7.3 8.2 10.8*  NEUTROABS 5.4 5.2  --  7.7  HGB 11.1* 10.3* 11.5* 13.1  HCT 33.7* 31.9* 35.2* 38.2*  MCV 93.9 94.9 92.9 91.2  PLT 110* 117* 144* 126*   Blood Culture    Component Value Date/Time   SDES BLOOD RIGHT HAND 01/19/2023 0213   SPECREQUEST  01/19/2023 1610    BOTTLES DRAWN AEROBIC AND ANAEROBIC Blood Culture results may not be optimal due to an excessive volume of blood received in culture bottles   CULT  01/19/2023 0213    NO GROWTH 5 DAYS Performed at [facility], [address], [place], [place] [address]    REPTSTATUS 01/24/2023 FINAL 01/19/2023 4098    Cardiac Enzymes: No results for input(s): "CKTOTAL", "CKMB", "CKMBINDEX", "TROPONINI" in the last 168 hours. CBG: Recent Labs  Lab 10/20/23 1735 10/20/23 1945 10/20/23 2042 10/22/23 1123  GLUCAP 89 68* 92 94   Iron  Studies: No results for input(s): "IRON ", "TIBC", "TRANSFERRIN", "FERRITIN" in the last 72 hours. Lab Results  Component Value Date   INR 2.5 (H) 04/20/2023   INR 2.6 (H) 04/19/2023   INR 2.7 (H) 04/18/2023   PROTIME 28.8 (H) 11/04/2013  PROTIME 21.6 (H) 10/14/2013   PROTIME 18.0 (H) 09/23/2013   Studies/Results: Overnight EEG with video Result Date: 10/25/2023 Arleene Lack, MD     10/25/2023  6:33 AM Patient Name: [name] [name] MRN: [id number] Epilepsy Attending: Arleene Lack Referring Physician/Provider: Khaliqdina, Salman, MD Duration: 10/24/2023 1141 to 10/25/2023 0600 Patient history: 43 y.o. male with hx of ESRD on hemodialysis, hypertension, prior stroke, history of seizures, lupus anticoagulant on Eiquis was brought to the ER for seizure and altered mental status. EEG to evaluate for seizure. Level of alertness: Awake AEDs during EEG study: LEV Technical aspects: This EEG study was done with scalp electrodes positioned according to the 10-20 International system of electrode placement. Electrical activity was reviewed with band pass filter of 1-70Hz , sensitivity of 7 uV/mm,  display speed of 6mm/sec with a 60Hz  notched filter applied as appropriate. EEG data were recorded continuously and digitally stored.  Video monitoring was available and reviewed as appropriate. Description: EEG showed continuous generalized high amplitude sharply contoured and at times rhythmic 3 to 6 Hz theta-delta slowing. Hyperventilation and photic stimulation were not performed. One seizure without clinical signs was noted on 10/24/2023 at 1754. EEG showed rhythmic sharply contoured 4-5hz  theta slowing in right>left frontal region which then evolved into 2-3hz  spike and wave in right fronto-temporal region. This was followed by 5-6hz  high amplitude that slowing in right hemisphere which then also involved left hemisphere. Subsequently eeg evolved into 1-1.5hz  delta slowing. Duration of seizure was about 2 minutes. Parts of study was technically difficult due to significant electrode artifact. After around 2200, EEG ws not interpretable due to electrode artifact. ABNORMALITY - Seizure without clinical signs, right frontal region - Continuous slow, generalized IMPRESSION: This technically difficult study showed one seizure without clinical signs on 10/24/2023 at 1754 arising from right frontal region, lasting about 2 minutes. Additionally there was moderate diffuse encephalopathy. Priyanka O Yadav    Medications:  thiamine  (VITAMIN B1) injection 500 mg (10/24/23 2308)   Followed by   thiamine  (VITAMIN B1) injection      amLODipine   10 mg Oral QHS   apixaban   2.5 mg Oral BID   atorvastatin   40 mg Oral Daily   calcitRIOL   2.5 mcg Oral Q M,W,F-HD   carvedilol   6.25 mg Oral BID WC   cinacalcet   150 mg Oral Q M,W,F-HD   divalproex  500 mg Oral Q12H   heparin  sodium (porcine)  3,200 Units Intracatheter Once   levETIRAcetam   500 mg Oral BID   multivitamin  1 tablet Oral QHS   sevelamer  carbonate  3,200 mg Oral TID WC   sodium chloride  flush  10-40 mL Intracatheter Q12H   [START ON 10/30/2023] thiamine    100 mg Oral Daily    Dialysis Orders: MWF - [facility] 4hrs, BFR 400, DFR 800,  EDW 110.5kg, 2K/ 2Ca Heparin : No bolus ordered Mircera: None ordered Calcitriol  2.5mcg PO qHD - last dose 10/19/23 Sensipar  150mg  with HD - last dose 10/19/23  Assessment/Plan: Seizure disorder - Neurology following; EEG suggestive of moderate diffuse encephalopathy, no seizures noted. and MRI brain no acute changes, just chronic. Received loading dose of IV Keppra . Had 2 seizure episodes on HD today started around 11am and last around per the HD RN. Neurology assessed the patient on the HD unit and Keppra  dose ordered. Currently, he's not responding but moving all extremities spontaneously. Plan for patient to be transferred to the progressive unit. Acute encephalopathy - See above ESRD -  on  HD MWF. On HD. 1.3L removed but UF held for duration of treatment 2nd recent seizures. See above. Hypertension/volume  - Blood pressures mainly up likely 2nd hypervolemic. He's nowhere near his EDW in outpatient and occasionally refuses extra treatments. Push UF here as tolerated. Resume home BP meds. Anemia of CKD - Current Hgb at goal. No Fe/ESA is indicated at this time. Secondary Hyperparathyroidism -  Resume Calcitriol  and Sensipar  Nutrition - Renal diet with fluid restriction  Jadene Maxwell, NP [facility] 10/25/2023,12:17 PM  LOS: 4 days

## 2023-10-25 NOTE — Progress Notes (Signed)
 NEUROLOGY FOLLOW UP NOTE   Date of service: Oct 25, 2023 Patient Name: [name] [name] MRN:  [id number] DOB:  01-Jul-1980 Chief Complaint: "Acute Encephalopathy " Requesting Provider: Melonie Square, Masiku Agat*  Interval HP/Subjective  [name] [name] is a 43 y.o. male with hx of ESRD on hemodialysis, hypertension, prior stroke, history of seizures, lupus anticoagulant on Eiquis was brought to the ER for seizure and altered mental status.  Recent Interval of events:  Continues to be encephalopathic. Awake with some ?aphasia out of proportion to encephalopathy with cognitive deficits.  Had a subclinical seizure while on LTM EEG, right frontal.  He pulled out most of his leads last night and had 2 witnessed seizures during HD.  I had earlier requested staff at HD to make sure that he gets loading dose of Valproic acid IV.   ROS  Unable to assess due to patients mentation and cooperation   Past History   Past Medical History:  Diagnosis Date   ESRD on hemodialysis (HCC)    Hypertension    Lupus    Seizures (HCC)    Seizures (HCC)    Stroke (HCC)    56 or 43 years of age.     Wears glasses     Past Surgical History:  Procedure Laterality Date   AV FISTULA PLACEMENT Left 12/30/2017   Procedure: ARTERIOVENOUS (AV) FISTULA CREATION VERSUS INSERTION OF ARTERIOVENOUS GRAFT LEFT ARM;  Surgeon: Mayo Speck, MD;  Location: [facility] OR;  Service: Vascular;  Laterality: Left;   BASCILIC VEIN TRANSPOSITION Left 08/15/2018   Procedure: LEFT FIRST STAGE BASCILIC VEIN TRANSPOSITION;  Surgeon: Mayo Speck, MD;  Location: [facility] OR;  Service: Vascular;  Laterality: Left;   BASCILIC VEIN TRANSPOSITION Left 10/30/2018   Procedure: INSERTION OF GORE-TEX GRAFT LEFT UPPER ARM;  Surgeon: Adine Hoof, MD;  Location: [facility] OR;  Service: Vascular;  Laterality: Left;   INSERTION OF DIALYSIS CATHETER N/A 09/20/2018   Procedure: INSERTION OF TUNNELED DIALYSIS CATHETER RIGHT INTERNAL JUGULAR;  Surgeon:  Young Hensen, MD;  Location: [facility] OR;  Service: Vascular;  Laterality: N/A;   IR FLUORO GUIDE CV LINE RIGHT  07/16/2022   IR US  GUIDE VASC ACCESS RIGHT  07/16/2022   RENAL BIOPSY      Family History: Family History  Problem Relation Age of Onset   Diabetes Mother    Diabetes Maternal Grandmother    Seizures Neg Hx        not on mother's side. he doesn't know about his father's side    Social History  reports that he has been smoking e-cigarettes. He has never used smokeless tobacco. He reports that he does not currently use alcohol. He reports that he does not use drugs.  Allergies  Allergen Reactions   Chlorhexidine  Itching   Cetirizine & Related Swelling    Medications   Current Facility-Administered Medications:    alteplase  (CATHFLO ACTIVASE ) injection 2 mg, 2 mg, Intracatheter, Once PRN, Kitty Perkins, NP   amLODipine  (NORVASC ) tablet 10 mg, 10 mg, Oral, QHS, Angelene Kelly, MD, 10 mg at 10/24/23 2311   apixaban  (ELIQUIS ) tablet 2.5 mg, 2.5 mg, Oral, BID, Angelene Kelly, MD, 2.5 mg at 10/24/23 2311   atorvastatin  (LIPITOR) tablet 40 mg, 40 mg, Oral, Daily, Angelene Kelly, MD, 40 mg at 10/24/23 1156   calcitRIOL  (ROCALTROL ) capsule 2.5 mcg, 2.5 mcg, Oral, Q M,W,F-HD, Jadene Maxwell E, NP, 2.5 mcg at 10/23/23 1314   carvedilol  (COREG ) tablet 6.25 mg, 6.25  mg, Oral, BID WC, Angelene Kelly, MD, 6.25 mg at 10/24/23 1156   cinacalcet  (SENSIPAR ) tablet 150 mg, 150 mg, Oral, Q M,W,F-HD, Jadene Maxwell E, NP, 150 mg at 10/23/23 1313   divalproex (DEPAKOTE) DR tablet 500 mg, 500 mg, Oral, Q12H, Nashla Althoff, MD   heparin  injection 1,000 Units, 1,000 Units, Dialysis, PRN, Kitty Perkins, NP   hydrALAZINE  (APRESOLINE ) injection 5 mg, 5 mg, Intravenous, Q6H PRN, Reesa Cannon N, DO, 5 mg at 10/22/23 1003   levETIRAcetam  (KEPPRA ) tablet 500 mg, 500 mg, Oral, BID, Angelene Kelly, MD, 500 mg at 10/24/23 2311   lidocaine  (PF)  (XYLOCAINE ) 1 % injection 5 mL, 5 mL, Intradermal, PRN, Kitty Perkins, NP   lidocaine -prilocaine  (EMLA ) cream 1 Application, 1 Application, Topical, PRN, Anderson, Courtney E, NP   LORazepam  (ATIVAN ) injection 2 mg, 2 mg, Intravenous, Q4H PRN, Mdala-Gausi, Masiku Agatha, MD   multivitamin (RENA-VIT) tablet 1 tablet, 1 tablet, Oral, QHS, Angelene Kelly, MD, 1 tablet at 10/24/23 2311   pentafluoroprop-tetrafluoroeth (GEBAUERS) aerosol 1 Application, 1 Application, Topical, PRN, Anderson, Courtney E, NP   sevelamer  carbonate (RENVELA ) tablet 3,200 mg, 3,200 mg, Oral, TID WC, Kakrakandy, Arshad N, MD   sodium chloride  flush (NS) 0.9 % injection 10-40 mL, 10-40 mL, Intracatheter, Q12H, Mdala-Gausi, Masiku Agatha, MD, 10 mL at 10/24/23 1202   sodium chloride  flush (NS) 0.9 % injection 10-40 mL, 10-40 mL, Intracatheter, PRN, Mdala-Gausi, Masiku Agatha, MD   thiamine  (VITAMIN B1) 500 mg in sodium chloride  0.9 % 50 mL IVPB, 500 mg, Intravenous, TID, Last Rate: 110 mL/hr at 10/24/23 2308, 500 mg at 10/24/23 2308 **FOLLOWED BY** thiamine  (VITAMIN B1) 250 mg in sodium chloride  0.9 % 50 mL IVPB, 250 mg, Intravenous, Q24H **FOLLOWED BY** [START ON 10/30/2023] thiamine  (VITAMIN B1) tablet 100 mg, 100 mg, Oral, Daily, Mdala-Gausi, Jilda Most, MD  Vitals   Vitals:   10/25/23 1255 10/25/23 1300 10/25/23 1330 10/25/23 1421  BP:  135/83 (!) 125/95 125/89  Pulse:    98  Resp:    19  Temp:      TempSrc:      SpO2:    100%  Weight: 111.5 kg     Height:        Body mass index is 33.33 kg/m.  Physical Exam   Constitutional: Appears well-developed, well-nourished Psych: Affect appropriate to situation.  Eyes: No scleral injection.  HENT: No OP obstruction.  Head: Normocephalic. Cardiovascular: Normal rate and regular rhythm.  Respiratory: Effort normal, non-labored breathing.  GI: Soft.  No distension. There is no tenderness.  Skin: WDI.   Neurologic Examination   Neurological  Exam:  Mental status/Cognition:  Alert, arousable, oriented to person. Disoriented to age, month, year. Pt has waning attention and concentration. Pt is able to follow intermittently able to follow step commands, some amnesia.   Speech/language: Patient does not have dysarthria, normal volume, normal fluency  Cranial nerves:   CN II Pupils equal and reactive to light, pt blinks to threat bilaterally.   CN III,IV,VI EOM intact, no gaze preference or deviation, no nystagmus   CN V normal sensation in V1, V2, and V3 segments bilaterally   CN VII no asymmetry, no nasolabial fold flattening   CN VIII Makes eye contact to speech.   CN IX & X normal palatal elevation, no uvular deviation   CN XI 5/5 head turn and 5/5 shoulder shrug bilaterally   CN XII midline tongue protrusion    Motor:  Muscle bulk: Normal,  tone normal  Strength Testing Left Right  Head turn 5/5 5/5  Shoulder shrug (C5) 5/5 5/5  Hand grip (C8-T1) 5/5 5/5  Deltoid Up (C5-C6) 5/5 5/5  Deltoid Down (C5-C6) 5/5 5/5  Push (C7) 5/5 5/5  Pull (C5-C6) 5/5 5/5  Thigh Raise (L4-S2) 5/5 5/5  Knee Extension (L3-L4) 5/5 5/5  Knee Flexion (L5-S1) 5/5 5/5  Ankle Dorsiflexion (L4-L5) 5/5 5/5  Ankle Plantarflexion (S1-S2) 5/5 5/5        Sensation: Intact throughout to light touch equal in all extremities   Coordination/Complex Motor:  - Gait: Deferred  Labs/Imaging/Neurodiagnostic studies   CBC:  Recent Labs  Lab Nov 11, 2023 1410 10/23/23 0830 10/25/23 0646  WBC 7.3 8.2 10.8*  NEUTROABS 5.2  --  7.7  HGB 10.3* 11.5* 13.1  HCT 31.9* 35.2* 38.2*  MCV 94.9 92.9 91.2  PLT 117* 144* 126*   Basic Metabolic Panel:  Lab Results  Component Value Date   NA 136 10/25/2023   K 4.1 10/25/2023   CO2 22 10/25/2023   GLUCOSE 108 (H) 10/25/2023   BUN 55 (H) 10/25/2023   CREATININE 17.39 (H) 10/25/2023   CALCIUM  9.6 10/25/2023   GFRNONAA 3 (L) 10/25/2023   GFRAA 4 (L) 01/14/2020   Lipid Panel:  Lab Results  Component  Value Date   LDLCALC 105 (H) 01/27/2023   HgbA1c:  Lab Results  Component Value Date   HGBA1C 5.2 01/19/2023   Urine Drug Screen:     Component Value Date/Time   LABOPIA NONE DETECTED 02/15/2009 1435   COCAINSCRNUR NONE DETECTED 02/15/2009 1435   LABBENZ NONE DETECTED 02/15/2009 1435   AMPHETMU NONE DETECTED 02/15/2009 1435   THCU NONE DETECTED 02/15/2009 1435   LABBARB  02/15/2009 1435    NONE DETECTED        DRUG SCREEN FOR MEDICAL PURPOSES ONLY.  IF CONFIRMATION IS NEEDED FOR ANY PURPOSE, NOTIFY LAB WITHIN 5 DAYS.        LOWEST DETECTABLE LIMITS FOR URINE DRUG SCREEN Drug Class       Cutoff (ng/mL) Amphetamine      1000 Barbiturate      200 Benzodiazepine   200 Tricyclics       300 Opiates          300 Cocaine          300 THC              50    Alcohol Level     Component Value Date/Time   ETH <15 Nov 11, 2023 0415   INR  Lab Results  Component Value Date   INR 2.5 (H) 04/20/2023   APTT  Lab Results  Component Value Date   APTT 57 (H) 09/20/2018   AED levels:  Lab Results  Component Value Date   PHENYTOIN  <2.5 REPEATED TO VERIFY (L) 06/09/2010   LAMOTRIGINE  <1.0 (L) 08/01/2023   LEVETIRACETA 47.4 (H) 11-Nov-2023   CT Head without contrast(Personally reviewed): 10/20/23 1. No acute intracranial abnormality. Multiple chronic infarcts.   MRI Brain:(5/6) 1. No evidence of acute intracranial abnormality. 2. Multiple remote infarcts and advanced chronic microvascular ischemic disease.  Neurodiagnostics rEEG: 5/5 report  This study is suggestive of moderate diffuse encephalopathy.  Sharp transients were noted in the right frontal region.     Because EEG showed rhythmic slowing as well as sharp transients, would recommend long-term EEG monitoring for further evaluation.  LTM EEG: 5/6 report  This study is suggestive of moderate diffuse encephalopathy. No seizures were noted.  ASSESSMENT   [name] [name] is a 43 y.o. male with hx of ESRD on  hemodialysis, hypertension, prior stroke, history of seizures, lupus anticoagulant on Eiquis was brought to the ER for seizure and altered mental status.  Continues to be encephalopathic. LTM EEG initially negative. MRI Brain with no acute abnormalitis. With persistent confusion, repeat LTM EEG obtained which showed a R frontal subclinical seizure.  RECOMMENDATIONS  - LTM EEG  - Continue with Keppra  at 500 mg twice daily, max for ESRD. Will add additional Keppra  500 on days of HD - loaded with Valproic acid 2000mg  IV once and started on VPA 500mg  BID. - Continue Thiamine  100 mg daily  ______________________________________________________________________  Signed,  Glennie Bose, MD [facility] [phone]   If 7pm to 7am, please call on call as listed on AMION.

## 2023-10-25 NOTE — Progress Notes (Signed)
 Speech Language Pathology    Patient Details Name: [name] [name] MRN: [id number] DOB: September 27, 1980 Today's Date: 10/25/2023 Time:  -     Pt currently in HD. Will continue efforts for swallow and speech-language-cognitive eval as pt available.                                 Naomia Bachelor  10/25/2023, 8:34 AM

## 2023-10-25 NOTE — Progress Notes (Signed)
 Progress Note   Patient: [name] [name] ZOX:[id number] DOB: 1980-12-19 DOA: 10/20/2023     4 DOS: the patient was seen and examined on 10/25/2023   Brief hospital course: 43 y.o. male with history of ESRD on hemodialysis MWF, hypertension, prior stroke, history of seizures, lupus anticoagulant brought to the ER on 5/5 after patient had a seizure.  Patient's friend was at the bedside and his roommate states that patient had a seizure on Oct 18, 2023 and he missed his dialysis due to that.  Later in the evening he had another seizure.  He did go to his dialysis on Saturday the following day.  But due to increased confusion patient was brought to the ER.  Initially in the ER patient signed out AMA but on the way out patient was confused and [facility] brought him back.  Per patient's friend patient last took his medication on Oct 17, 2023.    The patient has been seen by neurology and nephrology.  He he has had an EEG is suggestive of moderate diffuse encephalopathy.  Sharp transients were noted in the right frontal region.  Per neurology, "because EEG showed rhythmic slowing as well as sharp transients, would recommend long-term EEG monitoring for further evaluation."  Assessment and Plan:  Acute encephalopathy Initially thought due to postictal state but has persisted.   Psychiatric disorder seems unlikely, given EEG findings. Possible the patient has suffered some hypoxic brain injury related to his seizures. Patient has been seen by neurology who recommended an encephalopathy workup. TSH: WNL. HIV: Nonreactive. B12: 539. RPR, B1 pending. Neurology considering lumbar puncture. -Continue Wernicke's dose thiamine . -Will continue to discuss management with neurology.  Seizure disorder Patient with known seizure disorder. He is on Keppra  at home. Of note, the patient has presented to the ED several times in the past few months with breakthrough seizures. EEG suggestive of  moderate diffuse encephalopathy. MRI showed multiple remote infarcts, no acute changes. Patient has been seen by neurology, who recommended an encephalopathy workup as well as high-dose thiamine . Patient had a breakthrough seizure on 5/9. - Continue home Keppra . -Started on Depakote.  ESRD on HD MWF. Nephrology consulted.  Input appreciated. - Continue dialysis per home schedule.  Uncontrolled hypertension Home medications include amlodipine , carvedilol . - Continue home medications.     Subjective: Patient had a seizure while at hemodialysis today.  Rapid response was called.  Patient was started on Depakote.  Transferred to PCU.  Neurology following.  Physical Exam: Vitals:   10/25/23 1255 10/25/23 1300 10/25/23 1330 10/25/23 1421  BP:  135/83 (!) 125/95 125/89  Pulse:    98  Resp:    19  Temp:      TempSrc:      SpO2:    100%  Weight: 111.5 kg     Height:       Physical Exam   General: Alert, oriented X2 Eyes: Pupils equal, reactive  Oral cavity: moist mucous membranes  Head: Atraumatic, normocephalic  Neck: supple  Chest: clear to auscultation. No crackles, no wheezes  CVS: S1,S2 RRR. No murmurs  Abd: No distention, soft, non-tender. No masses palpable  Extr: No edema   MSK: No joint deformities or swelling  Neurological: Grossly intact.    Data Reviewed:     Latest Ref Rng & Units 10/25/2023    6:46 AM 10/23/2023    8:30 AM 10/21/2023    2:10 PM  CBC  WBC 4.0 - 10.5 K/uL 10.8  8.2  7.3   Hemoglobin 13.0 - 17.0 g/dL 16.1  09.6  04.5   Hematocrit 39.0 - 52.0 % 38.2  35.2  31.9   Platelets 150 - 400 K/uL 126  144  117       Latest Ref Rng & Units 10/25/2023    6:46 AM 10/24/2023    5:59 AM 10/23/2023    8:32 PM  BMP  Glucose 70 - 99 mg/dL 409  92  89   BUN 6 - 20 mg/dL 55  33  27   Creatinine 0.61 - 1.24 mg/dL 81.19  14.78  29.56   Sodium 135 - 145 mmol/L 136  135  136   Potassium 3.5 - 5.1 mmol/L 4.1  4.5  4.5   Chloride 98 - 111 mmol/L 95  96  93   CO2 22  - 32 mmol/L 22  22  24    Calcium  8.9 - 10.3 mg/dL 9.6  9.4  21.3      Family Communication: Called and updated friend Tamra Falling)  Disposition: Status is: Inpatient Remains inpatient appropriate because: Ongoing encephalopathy   Planned Discharge Destination: Home  DVT PPX: systemic anticoagulation with Apixaban     Time spent: 35 minutes  Author: MDALA-GAUSI, Lauria Depoy AGATHA, MD 10/25/2023 2:36 PM  For on call review [email or web address].

## 2023-10-25 NOTE — Significant Event (Signed)
 Rapid Response Event Note   Reason for Call :  Focal Seizure Right arm x 2 minutes, twice 10 min apart.  Loss of Bowel He received his IV depakote about 1050  Initial Focused Assessment:  Patient is unresponsive lying in the bed. He is starting to spontaneously move his extremities. Still does not engage with staff.  He is breathing easily in no distress.   BP 74/41, 87/59, 90/65 HR 90-100 RR 16-24 O2 sat 94-97%   Patient is on LTM EEG   Interventions:  Educated staff to press button when seizure activity noted.  Notified Dr Murvin Arthurs of seizure activity, no new orders, confirmed Depakote given IV   Plan of Care:  Transfer to PCU post HD treatment   Event Summary:   MD Notified: Dr Melonie Square Call Time: 1115 Arrival Time: 1118 End Time: 1230  Waldemar Guillaume, RN

## 2023-10-25 NOTE — Progress Notes (Addendum)
 Received patient in bed.Alert to self,confused for the rest of orientation.Consent verified.  Access used : Left hd catheter that worked well.Dressing on date.  UF goal 1.3 liters.Please see hemo comment.  Around 11:00 am,Patient loc decreased,not responding to verbal and sternal stimuli,Left eye half open but eyeball was rolling back, right eye closed.He was hypotensive ,but his RR,HR AND O2 at room air were all normal.200 cc NS bolus given. Net uf at this time 1,900 cc.Right extremities was having tremor,lasted less than two minutes, he has had two episodes of seizure 10 minutes apart ,same time duration at right arm .He  moved his bowel during the first episodes.No drooling noted at both episodes.Rapid respond called and was at patient's bedside several times.For the rest of the treatment ,his is slow ,has flat facial affect .At the end of the treatment ,patient's LOC was gradually was coming back to his baseline . Renal P.A.  and PMD made aware of the above episodes.  Medicines given : Valproate acid 1500 mg bolus .   Hand off to the patient Transported to his new room with tele-monitor by his hd nurse and EEG tech

## 2023-10-26 ENCOUNTER — Encounter (HOSPITAL_COMMUNITY)

## 2023-10-26 ENCOUNTER — Inpatient Hospital Stay (HOSPITAL_COMMUNITY)

## 2023-10-26 DIAGNOSIS — R569 Unspecified convulsions: Secondary | ICD-10-CM | POA: Diagnosis not present

## 2023-10-26 DIAGNOSIS — G9340 Encephalopathy, unspecified: Secondary | ICD-10-CM | POA: Diagnosis not present

## 2023-10-26 DIAGNOSIS — R4182 Altered mental status, unspecified: Secondary | ICD-10-CM | POA: Diagnosis not present

## 2023-10-26 DIAGNOSIS — I120 Hypertensive chronic kidney disease with stage 5 chronic kidney disease or end stage renal disease: Secondary | ICD-10-CM | POA: Diagnosis not present

## 2023-10-26 DIAGNOSIS — N186 End stage renal disease: Secondary | ICD-10-CM | POA: Diagnosis not present

## 2023-10-26 LAB — BASIC METABOLIC PANEL WITH GFR
Anion gap: 20 — ABNORMAL HIGH (ref 5–15)
BUN: 42 mg/dL — ABNORMAL HIGH (ref 6–20)
CO2: 21 mmol/L — ABNORMAL LOW (ref 22–32)
Calcium: 10.3 mg/dL (ref 8.9–10.3)
Chloride: 97 mmol/L — ABNORMAL LOW (ref 98–111)
Creatinine, Ser: 13.86 mg/dL — ABNORMAL HIGH (ref 0.61–1.24)
GFR, Estimated: 4 mL/min — ABNORMAL LOW (ref >60)
Glucose, Bld: 84 mg/dL (ref 70–99)
Potassium: 4.6 mmol/L (ref 3.5–5.1)
Sodium: 138 mmol/L (ref 135–145)

## 2023-10-26 LAB — CBC
HCT: 40.7 % (ref 39.0–52.0)
Hemoglobin: 13.7 g/dL (ref 13.0–17.0)
MCH: 30.6 pg (ref 26.0–34.0)
MCHC: 33.7 g/dL (ref 30.0–36.0)
MCV: 90.8 fL (ref 80.0–100.0)
Platelets: 106 10*3/uL — ABNORMAL LOW (ref 150–400)
RBC: 4.48 MIL/uL (ref 4.22–5.81)
RDW: 13.5 % (ref 11.5–15.5)
WBC: 9.8 10*3/uL (ref 4.0–10.5)
nRBC: 0 % (ref 0.0–0.2)

## 2023-10-26 LAB — PHOSPHORUS: Phosphorus: 7.8 mg/dL — ABNORMAL HIGH (ref 2.5–4.6)

## 2023-10-26 LAB — MAGNESIUM: Magnesium: 2.4 mg/dL (ref 1.7–2.4)

## 2023-10-26 MED ORDER — HYDRALAZINE HCL 20 MG/ML IJ SOLN
5.0000 mg | Freq: Four times a day (QID) | INTRAMUSCULAR | Status: DC | PRN
  Administered 2023-10-30: 5 mg via INTRAVENOUS
  Filled 2023-10-26: qty 1

## 2023-10-26 MED ORDER — DIVALPROEX SODIUM 250 MG PO DR TAB
500.0000 mg | DELAYED_RELEASE_TABLET | Freq: Three times a day (TID) | ORAL | Status: DC
  Administered 2023-10-26 – 2023-10-28 (×6): 500 mg via ORAL
  Filled 2023-10-26 (×8): qty 2

## 2023-10-26 MED ORDER — SODIUM CHLORIDE 0.9 % IV SOLN
200.0000 mg | Freq: Once | INTRAVENOUS | Status: AC
  Administered 2023-10-26: 200 mg via INTRAVENOUS
  Filled 2023-10-26: qty 20

## 2023-10-26 NOTE — Plan of Care (Signed)
  Problem: Education: Goal: Knowledge of General Education information will improve Description: Including pain rating scale, medication(s)/side effects and non-pharmacologic comfort measures Outcome: Not Progressing   Problem: Health Behavior/Discharge Planning: Goal: Ability to manage health-related needs will improve Outcome: Not Progressing   Problem: Clinical Measurements: Goal: Ability to maintain clinical measurements within normal limits will improve Outcome: Progressing Goal: Will remain free from infection Outcome: Progressing Goal: Diagnostic test results will improve Outcome: Progressing Goal: Respiratory complications will improve Outcome: Progressing Goal: Cardiovascular complication will be avoided Outcome: Progressing   Problem: Activity: Goal: Risk for activity intolerance will decrease Outcome: Not Progressing   Problem: Nutrition: Goal: Adequate nutrition will be maintained Outcome: Progressing   Problem: Coping: Goal: Level of anxiety will decrease Outcome: Progressing   Problem: Elimination: Goal: Will not experience complications related to bowel motility Outcome: Progressing Goal: Will not experience complications related to urinary retention Outcome: Progressing   Problem: Pain Managment: Goal: General experience of comfort will improve and/or be controlled Outcome: Progressing   Problem: Safety: Goal: Ability to remain free from injury will improve Outcome: Progressing   Problem: Skin Integrity: Goal: Risk for impaired skin integrity will decrease Outcome: Progressing   Problem: Education: Goal: Knowledge of disease and its progression will improve Outcome: Not Progressing   Problem: Fluid Volume: Goal: Compliance with measures to maintain balanced fluid volume will improve Outcome: Progressing   Problem: Health Behavior/Discharge Planning: Goal: Ability to manage health-related needs will improve Outcome: Progressing   Problem:  Nutritional: Goal: Ability to make healthy dietary choices will improve Outcome: Progressing   Problem: Clinical Measurements: Goal: Complications related to the disease process, condition or treatment will be avoided or minimized Outcome: Progressing

## 2023-10-26 NOTE — Progress Notes (Signed)
 NEUROLOGY FOLLOW UP NOTE   Date of service: Oct 26, 2023 Patient Name: [name] [name] MRN:  [id number] DOB:  10-Jul-1980 Chief Complaint: "Acute Encephalopathy " Requesting Provider: Melonie Square, [name] Agat*  Interval HP/Subjective  [name] [name] is a 43 y.o. male with hx of ESRD on hemodialysis, hypertension, prior stroke, history of seizures, lupus anticoagulant on Eiquis was brought to the ER for seizure and altered mental status.  Recent Interval of events:  Continues to be encephalopathic. Althou LTM EEG did not show any seizures, poor quality EEG. Notable for BL sharps.   ROS  Unable to assess due to patients mentation and cooperation   Past History   Past Medical History:  Diagnosis Date   ESRD on hemodialysis (HCC)    Hypertension    Lupus    Seizures (HCC)    Seizures (HCC)    Stroke (HCC)    63 or 43 years of age.     Wears glasses     Past Surgical History:  Procedure Laterality Date   AV FISTULA PLACEMENT Left 12/30/2017   Procedure: ARTERIOVENOUS (AV) FISTULA CREATION VERSUS INSERTION OF ARTERIOVENOUS GRAFT LEFT ARM;  Surgeon: Mayo Speck, MD;  Location: [facility] OR;  Service: Vascular;  Laterality: Left;   BASCILIC VEIN TRANSPOSITION Left 08/15/2018   Procedure: LEFT FIRST STAGE BASCILIC VEIN TRANSPOSITION;  Surgeon: Mayo Speck, MD;  Location: [facility] OR;  Service: Vascular;  Laterality: Left;   BASCILIC VEIN TRANSPOSITION Left 10/30/2018   Procedure: INSERTION OF GORE-TEX GRAFT LEFT UPPER ARM;  Surgeon: Adine Hoof, MD;  Location: [facility] OR;  Service: Vascular;  Laterality: Left;   INSERTION OF DIALYSIS CATHETER N/A 09/20/2018   Procedure: INSERTION OF TUNNELED DIALYSIS CATHETER RIGHT INTERNAL JUGULAR;  Surgeon: Young Hensen, MD;  Location: [facility] OR;  Service: Vascular;  Laterality: N/A;   IR FLUORO GUIDE CV LINE RIGHT  07/16/2022   IR US  GUIDE VASC ACCESS RIGHT  07/16/2022   RENAL BIOPSY      Family History: Family History  Problem Relation Age of  Onset   Diabetes Mother    Diabetes Maternal Grandmother    Seizures Neg Hx        not on mother's side. he doesn't know about his father's side    Social History  reports that he has been smoking e-cigarettes. He has never used smokeless tobacco. He reports that he does not currently use alcohol. He reports that he does not use drugs.  Allergies  Allergen Reactions   Chlorhexidine  Itching   Cetirizine & Related Swelling    Medications   Current Facility-Administered Medications:    alteplase  (CATHFLO ACTIVASE ) injection 2 mg, 2 mg, Intracatheter, Once PRN, Mdala-Gausi, [name] Agatha, MD   amLODipine  (NORVASC ) tablet 10 mg, 10 mg, Oral, QHS, [name] Craver, MD, 10 mg at 10/25/23 2110   apixaban  (ELIQUIS ) tablet 2.5 mg, 2.5 mg, Oral, BID, Mdala-Gausi, [name] Agatha, MD, 2.5 mg at 10/26/23 6578   atorvastatin  (LIPITOR) tablet 40 mg, 40 mg, Oral, Daily, Mdala-Gausi, [name] Agatha, MD, 40 mg at 10/26/23 4696   calcitRIOL  (ROCALTROL ) capsule 2.5 mcg, 2.5 mcg, Oral, Q M,W,F-HD, Mdala-Gausi, [name] Agatha, MD, 2.5 mcg at 10/23/23 1314   carvedilol  (COREG ) tablet 6.25 mg, 6.25 mg, Oral, BID WC, [name] Craver, MD, 6.25 mg at 10/24/23 1156   cinacalcet  (SENSIPAR ) tablet 150 mg, 150 mg, Oral, Q M,W,F-HD, Mdala-Gausi, [name] Agatha, MD, 150 mg at 10/23/23 1313   divalproex (DEPAKOTE) DR tablet 500 mg, 500 mg, Oral, Q12H, Mdala-Gausi,  [name] Agatha, MD, 500 mg at 10/26/23 1610   heparin  injection 1,000 Units, 1,000 Units, Dialysis, PRN, Mdala-Gausi, [name] Agatha, MD   hydrALAZINE  (APRESOLINE ) injection 5 mg, 5 mg, Intravenous, Q6H PRN, [name] Craver, MD   levETIRAcetam  (KEPPRA ) tablet 500 mg, 500 mg, Oral, BID, Mdala-Gausi, [name] Agatha, MD, 500 mg at 10/26/23 0820   [START ON 10/28/2023] levETIRAcetam  (KEPPRA ) tablet 500 mg, 500 mg, Oral, Q M,W,F-HD, [name] Hyson, MD   levETIRAcetam  (KEPPRA ) undiluted injection 500 mg, 500 mg, Intravenous, Q M,W,F, Lamonda Noxon, MD   lidocaine   (PF) (XYLOCAINE ) 1 % injection 5 mL, 5 mL, Intradermal, PRN, Mdala-Gausi, [name] Agatha, MD   lidocaine -prilocaine  (EMLA ) cream 1 Application, 1 Application, Topical, PRN, Mdala-Gausi, [name] Agatha, MD   LORazepam  (ATIVAN ) injection 2 mg, 2 mg, Intravenous, Q4H PRN, Mdala-Gausi, [name] Agatha, MD   multivitamin (RENA-VIT) tablet 1 tablet, 1 tablet, Oral, QHS, Mdala-Gausi, [name] Agatha, MD, 1 tablet at 10/25/23 2110   pentafluoroprop-tetrafluoroeth (GEBAUERS) aerosol 1 Application, 1 Application, Topical, PRN, Mdala-Gausi, [name] Agatha, MD   sevelamer  carbonate (RENVELA ) tablet 3,200 mg, 3,200 mg, Oral, TID WC, Mdala-Gausi, [name] Agatha, MD   sodium chloride  flush (NS) 0.9 % injection 10-40 mL, 10-40 mL, Intracatheter, Q12H, Mdala-Gausi, [name] Agatha, MD, 10 mL at 10/26/23 0826   sodium chloride  flush (NS) 0.9 % injection 10-40 mL, 10-40 mL, Intracatheter, PRN, Mdala-Gausi, [name] Most, MD   [COMPLETED] thiamine  (VITAMIN B1) 500 mg in sodium chloride  0.9 % 50 mL IVPB, 500 mg, Intravenous, Once, Last Rate: 110 mL/hr at 10/25/23 1745, 500 mg at 10/25/23 1745 **FOLLOWED BY** thiamine  (VITAMIN B1) 250 mg in sodium chloride  0.9 % 50 mL IVPB, 250 mg, Intravenous, Q24H, Last Rate: 105 mL/hr at 10/26/23 0832, 250 mg at 10/26/23 0832 **FOLLOWED BY** [START ON 10/31/2023] thiamine  (VITAMIN B1) tablet 100 mg, 100 mg, Oral, Daily, Mdala-Gausi, [name] Lavern Potash, MD  Vitals   Vitals:   10/25/23 2007 10/26/23 0007 10/26/23 0353 10/26/23 0801  BP: (!) 146/81 136/88 106/68 96/74  Pulse: 91 89 92 80  Resp: 19  20   Temp: 98.4 F (36.9 C) 98.9 F (37.2 C) 99 F (37.2 C) 98.4 F (36.9 C)  TempSrc: Oral Axillary Axillary Axillary  SpO2: 93% 95% 96%   Weight:      Height:        Body mass index is 33.33 kg/m.  Physical Exam   Constitutional: Appears well-developed, well-nourished Psych: Affect appropriate to situation.  Eyes: No scleral injection.  HENT: No OP obstruction.  Head:  Normocephalic. Cardiovascular: Normal rate and regular rhythm.  Respiratory: Effort normal, non-labored breathing.  GI: Soft.  No distension. There is no tenderness.  Skin: WDI.   Neurologic Examination   Neurological Exam:  Mental status/Cognition:  Alert, arousable, oriented to person. Disoriented to age, month, year. Pt has waning attention and concentration. Pt is able to follow intermittently able to follow step commands, some amnesia.   Speech/language: Patient does not have dysarthria, normal volume, normal fluency  Cranial nerves:   CN II Pupils equal and reactive to light, pt blinks to threat bilaterally.   CN III,IV,VI EOM intact, no gaze preference or deviation, no nystagmus   CN V normal sensation in V1, V2, and V3 segments bilaterally   CN VII no asymmetry, no nasolabial fold flattening   CN VIII Makes eye contact to speech.   CN IX & X normal palatal elevation, no uvular deviation   CN XI 5/5 head turn and 5/5 shoulder shrug bilaterally   CN XII  midline tongue protrusion    Motor:  Muscle bulk: Normal, tone normal  Strength Testing Left Right  Head turn 5/5 5/5  Shoulder shrug (C5) 5/5 5/5  Hand grip (C8-T1) 5/5 5/5  Deltoid Up (C5-C6) 5/5 5/5  Deltoid Down (C5-C6) 5/5 5/5  Push (C7) 5/5 5/5  Pull (C5-C6) 5/5 5/5  Thigh Raise (L4-S2) 5/5 5/5  Knee Extension (L3-L4) 5/5 5/5  Knee Flexion (L5-S1) 5/5 5/5  Ankle Dorsiflexion (L4-L5) 5/5 5/5  Ankle Plantarflexion (S1-S2) 5/5 5/5        Sensation: Intact throughout to light touch equal in all extremities   Coordination/Complex Motor:  - Gait: Deferred  Labs/Imaging/Neurodiagnostic studies   CBC:  Recent Labs  Lab 11-05-23 1410 10/23/23 0830 10/25/23 0646 10/26/23 0732  WBC 7.3   < > 10.8* 9.8  NEUTROABS 5.2  --  7.7  --   HGB 10.3*   < > 13.1 13.7  HCT 31.9*   < > 38.2* 40.7  MCV 94.9   < > 91.2 90.8  PLT 117*   < > 126* 106*   < > = values in this interval not displayed.   Basic Metabolic  Panel:  Lab Results  Component Value Date   NA 138 10/26/2023   K 4.6 10/26/2023   CO2 21 (L) 10/26/2023   GLUCOSE 84 10/26/2023   BUN 42 (H) 10/26/2023   CREATININE 13.86 (H) 10/26/2023   CALCIUM  10.3 10/26/2023   GFRNONAA 4 (L) 10/26/2023   GFRAA 4 (L) 01/14/2020   Lipid Panel:  Lab Results  Component Value Date   LDLCALC 105 (H) 01/27/2023   HgbA1c:  Lab Results  Component Value Date   HGBA1C 5.2 01/19/2023   Urine Drug Screen:     Component Value Date/Time   LABOPIA NONE DETECTED 02/15/2009 1435   COCAINSCRNUR NONE DETECTED 02/15/2009 1435   LABBENZ NONE DETECTED 02/15/2009 1435   AMPHETMU NONE DETECTED 02/15/2009 1435   THCU NONE DETECTED 02/15/2009 1435   LABBARB  02/15/2009 1435    NONE DETECTED        DRUG SCREEN FOR MEDICAL PURPOSES ONLY.  IF CONFIRMATION IS NEEDED FOR ANY PURPOSE, NOTIFY LAB WITHIN 5 DAYS.        LOWEST DETECTABLE LIMITS FOR URINE DRUG SCREEN Drug Class       Cutoff (ng/mL) Amphetamine      1000 Barbiturate      200 Benzodiazepine   200 Tricyclics       300 Opiates          300 Cocaine          300 THC              50    Alcohol Level     Component Value Date/Time   ETH <15 2023-11-05 0415   INR  Lab Results  Component Value Date   INR 2.5 (H) 04/20/2023   APTT  Lab Results  Component Value Date   APTT 57 (H) 09/20/2018   AED levels:  Lab Results  Component Value Date   PHENYTOIN  <2.5 REPEATED TO VERIFY (L) 06/09/2010   LAMOTRIGINE  <1.0 (L) 08/01/2023   LEVETIRACETA 47.4 (H) 2023/11/05   CT Head without contrast(Personally reviewed): 10/20/23 1. No acute intracranial abnormality. Multiple chronic infarcts.   MRI Brain:(5/6) 1. No evidence of acute intracranial abnormality. 2. Multiple remote infarcts and advanced chronic microvascular ischemic disease.  Neurodiagnostics rEEG: 5/5 report  This study is suggestive of moderate diffuse encephalopathy.  Sharp transients were  noted in the right frontal region.      Because EEG showed rhythmic slowing as well as sharp transients, would recommend long-term EEG monitoring for further evaluation.  LTM EEG: 5/10 report  ABNORMALITY - Sharp waves, left fronto-temporal region - Sharp waves, right fronto-temporal region - Continuous slow, generalized and maximal bifrontal   IMPRESSION: This technically difficult study showed independent epileptogenicity arising from left and right fronto-temporal region. Additionally there was cortical dysfunction in bifrontal region likely secondary to underlying structural abnormality. Lastly there was moderate diffuse encephalopathy. No seizures were noted.  ASSESSMENT   MICKAL BUSSEN is a 44 y.o. male with hx of ESRD on hemodialysis, hypertension, prior stroke, history of seizures, lupus anticoagulant on Eiquis was brought to the ER for seizure and altered mental status.  Continues to be encephalopathic. LTM EEG initially negative. MRI Brain with no acute abnormalitis. With persistent confusion, repeat LTM EEG obtained which showed a R frontal subclinical seizure.  Was on LTM again overnight, significantly poor quality study. No seizures noted but notable for BL sharps. I have concerns that with some worsening of encephalopathy today, that he probably had seizures that were not seen on LTM due to poor quality. Will escalate AEDs today.  RECOMMENDATIONS  - LTM EEG  - Continue with Keppra  at 500 mg twice daily, max for ESRD. Will add additional Keppra  500 on days of HD - loaded with vimpat 200mg  IV and will increase Valproic acid to 500mg  TID. Will get VPA levels tomorrow AM. - Continue Thiamine  100 mg daily  ______________________________________________________________________  Signed,  Grettel Rames, MD [facility] [phone]   If 7pm to 7am, please call on call as listed on AMION.

## 2023-10-26 NOTE — Procedures (Addendum)
 Patient Name: [name] [name]  MRN: [id number]  Epilepsy Attending: Arleene Lack  Referring Physician/Provider: Khaliqdina, Salman, MD  Duration: 10/26/2023 0500 to 10/27/2023 0500   Patient history: 43 y.o. male with hx of ESRD on hemodialysis, hypertension, prior stroke, history of seizures, lupus anticoagulant on Eiquis was brought to the ER for seizure and altered mental status. EEG to evaluate for seizure.   Level of alertness: Awake, asleep   AEDs during EEG study: LEV   Technical aspects: This EEG study was done with scalp electrodes positioned according to the 10-20 International system of electrode placement. Electrical activity was reviewed with band pass filter of 1-70Hz , sensitivity of 7 uV/mm, display speed of 74mm/sec with a 60Hz  notched filter applied as appropriate. EEG data were recorded continuously and digitally stored.  Video monitoring was available and reviewed as appropriate.   Description: EEG showed continuous generalized and maximal bifrontal 3 to 6 Hz theta-delta slowing admixed with 13-15hz  beta activity. Sleep was characterized by sleep spindles (12-14Hz ), maximal fronto-central region. Hyperventilation and photic stimulation were not performed.    Parts of study was technically difficult due to significant electrode artifact.    ABNORMALITY - Continuous slow, generalized and maximal bifrontal   IMPRESSION: This study was suggestive of cortical dysfunction in bifrontal region likely secondary to underlying structural abnormality. Additionally there was moderate diffuse encephalopathy. No seizures were noted.   Kerah Hardebeck O Keir Foland

## 2023-10-26 NOTE — Progress Notes (Signed)
 LTM maint complete - no skin breakdown under:  multiple leads fixed. No skin breakdown was seen. Re wrapped head

## 2023-10-26 NOTE — Progress Notes (Addendum)
 Progress Note   Patient: [name] [name] QIO:[id number] DOB: Jan 30, 1981 DOA: 10/20/2023     5 DOS: the patient was seen and examined on 10/26/2023   Brief hospital course: 43 y.o. male with history of ESRD on hemodialysis MWF, hypertension, prior stroke, history of seizures, lupus anticoagulant brought to the ER on 5/5 after patient had a seizure.  Patient's friend was at the bedside and his roommate states that patient had a seizure on Oct 18, 2023 and he missed his dialysis due to that.  Later in the evening he had another seizure.  He did go to his dialysis on Saturday the following day.  But due to increased confusion patient was brought to the ER.  Initially in the ER patient signed out AMA but on the way out patient was confused and [facility] brought him back.  Per patient's friend patient last took his medication on Oct 17, 2023.    The patient has been seen by neurology and nephrology.  He he has had an EEG is suggestive of moderate diffuse encephalopathy.  Sharp transients were noted in the right frontal region.  Per neurology, "because EEG showed rhythmic slowing as well as sharp transients, would recommend long-term EEG monitoring for further evaluation."  Patient had another seizure while in hemodialysis on 5/9. Moved to PCU.   Assessment and Plan:  Acute encephalopathy Initially thought due to postictal state but has persisted.   Psychiatric disorder seems unlikely, given EEG findings. Possible the patient has suffered some hypoxic brain injury related to his seizures. Patient has been seen by neurology who recommended an encephalopathy workup. TSH: WNL. HIV: Nonreactive. B12: 539. RPR, B1 pending.  Per neurology, EEG notable for BL sharps as well as right frontal subclinical seizure. Neurology is continuing to adjust his antiseizure medications.   -Continue Wernicke's dose thiamine . - Continue AEDs per neurology.  Seizure disorder Patient with known seizure  disorder. He is on Keppra  at home. Of note, the patient has presented to the ED several times in the past few months with breakthrough seizures. EEG suggestive of moderate diffuse encephalopathy. MRI showed multiple remote infarcts, no acute changes. Patient has been seen by neurology, who recommended an encephalopathy workup as well as high-dose thiamine . Patient had a breakthrough seizure on 5/9. Per neurology, EEG notable for BL sharps as well as right frontal subclinical seizure. Neurology is continuing to adjust his antiseizure medications.   Patient received Vimpat load -Continue Keppra . -Continue Depakote.  ESRD on HD MWF. Nephrology consulted.  Input appreciated. - Continue dialysis per home schedule.  Uncontrolled hypertension Home medications include amlodipine , carvedilol . - Continue home medications.     Subjective: Patient now in PCU and undergoing continuous EEG monitoring.  He is very tearful today.  Physical Exam: Vitals:   10/26/23 0007 10/26/23 0353 10/26/23 0801 10/26/23 1214  BP: 136/88 106/68 96/74 135/81  Pulse: 89 92 80 87  Resp:  20 20 16   Temp: 98.9 F (37.2 C) 99 F (37.2 C) 98.4 F (36.9 C) 98.9 F (37.2 C)  TempSrc: Axillary Axillary Axillary Axillary  SpO2: 95% 96% 97%   Weight:      Height:       Physical Exam   General: Alert, oriented X2, tearful Eyes: Pupils equal, reactive  Oral cavity: moist mucous membranes  Head: Atraumatic, normocephalic  Neck: supple  Chest: clear to auscultation. No crackles, no wheezes  CVS: S1,S2 RRR. No murmurs  Abd: No distention, soft, non-tender. No masses palpable  Extr:  No edema   MSK: No joint deformities or swelling  Neurological: Grossly intact.    Data Reviewed:     Latest Ref Rng & Units 10/26/2023    7:32 AM 10/25/2023    6:46 AM 10/23/2023    8:30 AM  CBC  WBC 4.0 - 10.5 K/uL 9.8  10.8  8.2   Hemoglobin 13.0 - 17.0 g/dL 16.1  09.6  04.5   Hematocrit 39.0 - 52.0 % 40.7  38.2  35.2    Platelets 150 - 400 K/uL 106  126  144       Latest Ref Rng & Units 10/26/2023    7:32 AM 10/25/2023    6:46 AM 10/24/2023    5:59 AM  BMP  Glucose 70 - 99 mg/dL 84  409  92   BUN 6 - 20 mg/dL 42  55  33   Creatinine 0.61 - 1.24 mg/dL 81.19  14.78  29.56   Sodium 135 - 145 mmol/L 138  136  135   Potassium 3.5 - 5.1 mmol/L 4.6  4.1  4.5   Chloride 98 - 111 mmol/L 97  95  96   CO2 22 - 32 mmol/L 21  22  22    Calcium  8.9 - 10.3 mg/dL 21.3  9.6  9.4      Family Communication: Friend Tamra Falling) at bedside.  Disposition: Status is: Inpatient Remains inpatient appropriate because: Ongoing encephalopathy   Planned Discharge Destination: Home  DVT PPX: systemic anticoagulation with Apixaban     Time spent: 35 minutes  Author: MDALA-GAUSI, Emersyn Kotarski AGATHA, MD 10/26/2023 2:52 PM  For on call review [email or web address].

## 2023-10-26 NOTE — Progress Notes (Signed)
  [facility] Progress Note   Subjective:    Seen in room Pt barely awake, not responding to questions Moves all extremities   Objective Vitals:   10/25/23 2007 10/26/23 0007 10/26/23 0353 10/26/23 0801  BP: (!) 146/81 136/88 106/68 96/74  Pulse: 91 89 92 80  Resp: 19  20   Temp: 98.4 F (36.9 C) 98.9 F (37.2 C) 99 F (37.2 C) 98.4 F (36.9 C)  TempSrc: Oral Axillary Axillary Axillary  SpO2: 93% 95% 96%   Weight:      Height:       Physical Exam General: Eyes open but not responding, NAD, in restraints Heart:S1 and S2; No murmurs, gallops, and rubs Lungs: Clear anteriorly Abdomen: Soft and non-tender Extremities: no LE edema Neuro: Not responding but moving all extremities spontaneously Dialysis Access: R TDC    Dialysis Orders: MWF [facility] 4h   B400   110.5kg  RIJ TDC   Heparin  none Mircera: none Calcitriol  2.5mcg PO qHD - last dose 10/19/23 Sensipar  150mg  with HD - last dose 10/19/23  Assessment/Plan: Seizure disorder - Neurology following; EEG suggestive of moderate diffuse encephalopathy, no seizures noted and MRI brain no acute changes. Received loading dose of IV Keppra . Had seizures on HD 5/09. On cont EEG now. Per neuro/ pmd.  Acute encephalopathy - as above ESRD -  on HD MWF. Got almost all of HD yesterday. Next HD Monday.  Hypertension/volume  - Blood pressures mainly up likely 2nd hypervolemia. Got 4.5 L off yest and BP's much better today, even low. Will place hold orders on active BP meds.  Anemia of CKD - Current Hgb at goal. No Fe/ESA is indicated at this time. Secondary Hyperparathyroidism -  Resume Calcitriol  and Sensipar  Nutrition - Renal diet with fluid restriction  Larry Poag  MD  [facility] 10/26/2023, 8:21 AM  Recent Labs  Lab 10/21/23 1410 10/23/23 0830 10/23/23 2032 10/24/23 0559 10/25/23 0646  HGB 10.3* 11.5*  --   --  13.1  ALBUMIN  3.2* 3.3*  --   --   --   CALCIUM  9.2 9.6   < > 9.4 9.6  PHOS  --  7.6*  --   --  7.5*  CREATININE  18.76* 17.76*   < > 13.33* 17.39*  K 4.1 4.1   < > 4.5 4.1   < > = values in this interval not displayed.    Inpatient medications:  amLODipine   10 mg Oral QHS   apixaban   2.5 mg Oral BID   atorvastatin   40 mg Oral Daily   calcitRIOL   2.5 mcg Oral Q M,W,F-HD   carvedilol   6.25 mg Oral BID WC   cinacalcet   150 mg Oral Q M,W,F-HD   divalproex  500 mg Oral Q12H   levETIRAcetam   500 mg Oral BID   [START ON 10/28/2023] levETIRAcetam   500 mg Oral Q M,W,F-HD   levETIRAcetam   500 mg Intravenous Q M,W,F   multivitamin  1 tablet Oral QHS   sevelamer  carbonate  3,200 mg Oral TID WC   sodium chloride  flush  10-40 mL Intracatheter Q12H   [START ON 10/31/2023] thiamine   100 mg Oral Daily    thiamine  (VITAMIN B1) injection     alteplase , heparin , hydrALAZINE , lidocaine  (PF), lidocaine -prilocaine , LORazepam , pentafluoroprop-tetrafluoroeth, sodium chloride  flush

## 2023-10-27 ENCOUNTER — Inpatient Hospital Stay (HOSPITAL_COMMUNITY)

## 2023-10-27 DIAGNOSIS — I6340 Cerebral infarction due to embolism of unspecified cerebral artery: Secondary | ICD-10-CM

## 2023-10-27 DIAGNOSIS — G9340 Encephalopathy, unspecified: Secondary | ICD-10-CM | POA: Diagnosis not present

## 2023-10-27 DIAGNOSIS — R4182 Altered mental status, unspecified: Secondary | ICD-10-CM | POA: Diagnosis not present

## 2023-10-27 DIAGNOSIS — R569 Unspecified convulsions: Secondary | ICD-10-CM | POA: Diagnosis not present

## 2023-10-27 DIAGNOSIS — I120 Hypertensive chronic kidney disease with stage 5 chronic kidney disease or end stage renal disease: Secondary | ICD-10-CM | POA: Diagnosis not present

## 2023-10-27 DIAGNOSIS — N186 End stage renal disease: Secondary | ICD-10-CM | POA: Diagnosis not present

## 2023-10-27 LAB — CBC
HCT: 39.1 % (ref 39.0–52.0)
Hemoglobin: 13 g/dL (ref 13.0–17.0)
MCH: 30.8 pg (ref 26.0–34.0)
MCHC: 33.2 g/dL (ref 30.0–36.0)
MCV: 92.7 fL (ref 80.0–100.0)
Platelets: 104 10*3/uL — ABNORMAL LOW (ref 150–400)
RBC: 4.22 MIL/uL (ref 4.22–5.81)
RDW: 13.6 % (ref 11.5–15.5)
WBC: 7.7 10*3/uL (ref 4.0–10.5)
nRBC: 0 % (ref 0.0–0.2)

## 2023-10-27 LAB — AMMONIA: Ammonia: 30 umol/L (ref 9–35)

## 2023-10-27 LAB — BASIC METABOLIC PANEL WITH GFR
Anion gap: 18 — ABNORMAL HIGH (ref 5–15)
BUN: 62 mg/dL — ABNORMAL HIGH (ref 6–20)
CO2: 24 mmol/L (ref 22–32)
Calcium: 9.7 mg/dL (ref 8.9–10.3)
Chloride: 95 mmol/L — ABNORMAL LOW (ref 98–111)
Creatinine, Ser: 18.16 mg/dL — ABNORMAL HIGH (ref 0.61–1.24)
GFR, Estimated: 3 mL/min — ABNORMAL LOW (ref >60)
Glucose, Bld: 95 mg/dL (ref 70–99)
Potassium: 4.3 mmol/L (ref 3.5–5.1)
Sodium: 137 mmol/L (ref 135–145)

## 2023-10-27 LAB — VALPROIC ACID LEVEL: Valproic Acid Lvl: 52 ug/mL (ref 50–100)

## 2023-10-27 MED ORDER — APIXABAN 2.5 MG PO TABS
5.0000 mg | ORAL_TABLET | Freq: Two times a day (BID) | ORAL | Status: DC
  Administered 2023-10-27 – 2023-11-12 (×31): 5 mg via ORAL
  Filled 2023-10-27: qty 2
  Filled 2023-10-27 (×16): qty 1
  Filled 2023-10-27: qty 2
  Filled 2023-10-27 (×4): qty 1
  Filled 2023-10-27: qty 2
  Filled 2023-10-27 (×5): qty 1
  Filled 2023-10-27: qty 2
  Filled 2023-10-27: qty 1
  Filled 2023-10-27: qty 2

## 2023-10-27 MED ORDER — SODIUM CHLORIDE 0.9 % IV BOLUS
500.0000 mL | Freq: Once | INTRAVENOUS | Status: AC
  Administered 2023-10-27: 500 mL via INTRAVENOUS

## 2023-10-27 MED ORDER — NEPRO/CARBSTEADY PO LIQD
237.0000 mL | Freq: Two times a day (BID) | ORAL | Status: DC
  Administered 2023-10-27 – 2023-11-12 (×11): 237 mL via ORAL

## 2023-10-27 NOTE — Procedures (Signed)
 Patient Name: [name] [name]  MRN: [id number]  Epilepsy Attending: Arleene Lack  Referring Physician/Provider: Khaliqdina, Salman, MD  Duration: 10/27/2023 0500 to 10/27/2023 0908   Patient history: 43 y.o. male with hx of ESRD on hemodialysis, hypertension, prior stroke, history of seizures, lupus anticoagulant on Eiquis was brought to the ER for seizure and altered mental status. EEG to evaluate for seizure.   Level of alertness: Awake, asleep   AEDs during EEG study: LEV   Technical aspects: This EEG study was done with scalp electrodes positioned according to the 10-20 International system of electrode placement. Electrical activity was reviewed with band pass filter of 1-70Hz , sensitivity of 7 uV/mm, display speed of 50mm/sec with a 60Hz  notched filter applied as appropriate. EEG data were recorded continuously and digitally stored.  Video monitoring was available and reviewed as appropriate.   Description: EEG showed continuous generalized and maximal bifrontal 3 to 6 Hz theta-delta slowing admixed with 13-15hz  beta activity. Sleep was characterized by sleep spindles (12-14Hz ), maximal fronto-central region. Hyperventilation and photic stimulation were not performed.     ABNORMALITY - Continuous slow, generalized and maximal bifrontal   IMPRESSION: This study was suggestive of cortical dysfunction in bifrontal region likely secondary to underlying structural abnormality. Additionally there was moderate diffuse encephalopathy. No seizures were noted.   Timiyah Romito O Calvert Charland

## 2023-10-27 NOTE — Progress Notes (Signed)
 [facility] Progress Note   Subjective:    Seen in room Pt not responding to questions   Objective Vitals:   10/26/23 2320 10/27/23 0435 10/27/23 1028 10/27/23 1233  BP: (!) 153/96 115/74 106/70 101/63  Pulse: 99 85 78 72  Resp:   16 19  Temp: 98.7 F (37.1 C) 98.7 F (37.1 C) 98.2 F (36.8 C) 98.8 F (37.1 C)  TempSrc: Axillary Axillary Oral Oral  SpO2: 95% 92% 97% 95%  Weight:      Height:       Physical Exam General: Eyes open but not responding, NAD, in restraints Heart:S1 and S2; No murmurs, gallops, and rubs Lungs: Clear anteriorly Abdomen: Soft and non-tender Extremities: no LE edema Neuro: Not responding  Dialysis Access: R TDC    Dialysis Orders: MWF [facility] 4h   B400   110.5kg  RIJ TDC   Heparin  none Mircera: none Calcitriol  2.5mcg PO qHD - last dose 10/19/23 Sensipar  150mg  with HD - last dose 10/19/23  Assessment/Plan: Seizure disorder - Neurology following; EEG suggestive of moderate diffuse encephalopathy, no seizures noted and MRI brain no acute changes. Had seizures on HD 5/09. Is on keppra , depakote and vimpat now. Per neuro/ pmd.  Acute encephalopathy - as above, not waking up, possibly some hypoxic brain injury related to the seizures. Neuro following.  ESRD -  on HD MWF. Next HD Monday.  HTN/ volume  - Blood pressures dropped yesterday after large UF on HD (1.3 L net UF). Will d/c norvasc  and coreg  for now given BP's in the 100/70 range. Keep even w/ next HD. Is not eating.  Anemia of CKD - Current Hgb at goal. No Fe/ESA is indicated at this time. Secondary Hyperparathyroidism -  Resumed Calcitriol  and Sensipar  Nutrition - Renal diet with fluid restriction  Larry Poag  MD  [facility] 10/27/2023, 12:36 PM  Recent Labs  Lab 10/21/23 1410 10/21/23 1410 10/23/23 0830 10/23/23 2032 10/25/23 0646 10/26/23 0732 10/27/23 0711  HGB 10.3*  --  11.5*  --  13.1 13.7 13.0  ALBUMIN  3.2*  --  3.3*  --   --   --   --   CALCIUM  9.2  --  9.6   < > 9.6  10.3 9.7  PHOS  --    < > 7.6*  --  7.5* 7.8*  --   CREATININE 18.76*  --  17.76*   < > 17.39* 13.86* 18.16*  K 4.1  --  4.1   < > 4.1 4.6 4.3   < > = values in this interval not displayed.    Inpatient medications:  amLODipine   10 mg Oral QHS   apixaban   5 mg Oral BID   atorvastatin   40 mg Oral Daily   calcitRIOL   2.5 mcg Oral Q M,W,F-HD   carvedilol   6.25 mg Oral BID WC   cinacalcet   150 mg Oral Q M,W,F-HD   divalproex  500 mg Oral Q8H   feeding supplement (NEPRO CARB STEADY)  237 mL Oral BID BM   levETIRAcetam   500 mg Oral BID   [START ON 10/28/2023] levETIRAcetam   500 mg Oral Q M,W,F-HD   levETIRAcetam   500 mg Intravenous Q M,W,F   multivitamin  1 tablet Oral QHS   sevelamer  carbonate  3,200 mg Oral TID WC   sodium chloride  flush  10-40 mL Intracatheter Q12H   [START ON 10/31/2023] thiamine   100 mg Oral Daily    thiamine  (VITAMIN B1) injection 250 mg (10/26/23 0832)  alteplase , heparin , hydrALAZINE , lidocaine  (PF), lidocaine -prilocaine , LORazepam , pentafluoroprop-tetrafluoroeth, sodium chloride  flush

## 2023-10-27 NOTE — Progress Notes (Signed)
 NEUROLOGY FOLLOW UP NOTE   Date of service: Oct 27, 2023 Patient Name: [name] [name] MRN:  [id number] DOB:  09/10/1980 Chief Complaint: "Acute Encephalopathy " Requesting Provider: Melonie Square, Masiku Agat*  Interval HP/Subjective  [name] [name] is a 43 y.o. male with hx of ESRD on hemodialysis, hypertension, prior stroke, history of seizures, lupus anticoagulant on Eiquis was brought to the ER for seizure and altered mental status.  Recent Interval of events:  Continues to be encephalopathic. Althou LTM EEG did not show any seizures, poor quality EEG. Notable for BL sharps.  Repeat MRI Brain with R MCA distribution strokes. He has been getting eliquis  here. Seems more awake in the afternoon today.   ROS  Unable to assess due to patients mentation and cooperation   Past History   Past Medical History:  Diagnosis Date   ESRD on hemodialysis (HCC)    Hypertension    Lupus    Seizures (HCC)    Seizures (HCC)    Stroke (HCC)    13 or 43 years of age.     Wears glasses     Past Surgical History:  Procedure Laterality Date   AV FISTULA PLACEMENT Left 12/30/2017   Procedure: ARTERIOVENOUS (AV) FISTULA CREATION VERSUS INSERTION OF ARTERIOVENOUS GRAFT LEFT ARM;  Surgeon: Mayo Speck, MD;  Location: [facility] OR;  Service: Vascular;  Laterality: Left;   BASCILIC VEIN TRANSPOSITION Left 08/15/2018   Procedure: LEFT FIRST STAGE BASCILIC VEIN TRANSPOSITION;  Surgeon: Mayo Speck, MD;  Location: [facility] OR;  Service: Vascular;  Laterality: Left;   BASCILIC VEIN TRANSPOSITION Left 10/30/2018   Procedure: INSERTION OF GORE-TEX GRAFT LEFT UPPER ARM;  Surgeon: Adine Hoof, MD;  Location: [facility] OR;  Service: Vascular;  Laterality: Left;   INSERTION OF DIALYSIS CATHETER N/A 09/20/2018   Procedure: INSERTION OF TUNNELED DIALYSIS CATHETER RIGHT INTERNAL JUGULAR;  Surgeon: Young Hensen, MD;  Location: [facility] OR;  Service: Vascular;  Laterality: N/A;   IR FLUORO GUIDE CV LINE RIGHT  07/16/2022    IR US  GUIDE VASC ACCESS RIGHT  07/16/2022   RENAL BIOPSY      Family History: Family History  Problem Relation Age of Onset   Diabetes Mother    Diabetes Maternal Grandmother    Seizures Neg Hx        not on mother's side. he doesn't know about his father's side    Social History  reports that he has been smoking e-cigarettes. He has never used smokeless tobacco. He reports that he does not currently use alcohol. He reports that he does not use drugs.  Allergies  Allergen Reactions   Chlorhexidine  Itching   Cetirizine & Related Swelling    Medications   Current Facility-Administered Medications:    alteplase  (CATHFLO ACTIVASE ) injection 2 mg, 2 mg, Intracatheter, Once PRN, Mdala-Gausi, Masiku Agatha, MD   apixaban  (ELIQUIS ) tablet 5 mg, 5 mg, Oral, BID, Mdala-Gausi, Masiku Agatha, MD   atorvastatin  (LIPITOR) tablet 40 mg, 40 mg, Oral, Daily, Mdala-Gausi, Masiku Agatha, MD, 40 mg at 10/27/23 1030   calcitRIOL  (ROCALTROL ) capsule 2.5 mcg, 2.5 mcg, Oral, Q M,W,F-HD, Mdala-Gausi, Masiku Agatha, MD, 2.5 mcg at 10/23/23 1314   cinacalcet  (SENSIPAR ) tablet 150 mg, 150 mg, Oral, Q M,W,F-HD, Mdala-Gausi, Masiku Agatha, MD, 150 mg at 10/23/23 1313   divalproex (DEPAKOTE) DR tablet 500 mg, 500 mg, Oral, Q8H, Suly Vukelich, MD, 500 mg at 10/27/23 1506   feeding supplement (NEPRO CARB STEADY) liquid 237 mL, 237 mL, Oral, BID BM,  Mdala-Gausi, Jilda Most, MD, 237 mL at 10/27/23 1506   heparin  injection 1,000 Units, 1,000 Units, Dialysis, PRN, Mdala-Gausi, Masiku Agatha, MD   hydrALAZINE  (APRESOLINE ) injection 5 mg, 5 mg, Intravenous, Q6H PRN, Chucky Craver, MD   levETIRAcetam  (KEPPRA ) tablet 500 mg, 500 mg, Oral, BID, Mdala-Gausi, Masiku Agatha, MD, 500 mg at 10/27/23 1029   [START ON 10/28/2023] levETIRAcetam  (KEPPRA ) tablet 500 mg, 500 mg, Oral, Q M,W,F-HD, Rorey Bisson, MD   levETIRAcetam  (KEPPRA ) undiluted injection 500 mg, 500 mg, Intravenous, Q M,W,F, Jamirra Curnow,  MD   lidocaine  (PF) (XYLOCAINE ) 1 % injection 5 mL, 5 mL, Intradermal, PRN, Mdala-Gausi, Masiku Agatha, MD   lidocaine -prilocaine  (EMLA ) cream 1 Application, 1 Application, Topical, PRN, Mdala-Gausi, Masiku Agatha, MD   LORazepam  (ATIVAN ) injection 2 mg, 2 mg, Intravenous, Q4H PRN, Mdala-Gausi, Masiku Agatha, MD   multivitamin (RENA-VIT) tablet 1 tablet, 1 tablet, Oral, QHS, Mdala-Gausi, Masiku Agatha, MD, 1 tablet at 10/26/23 2104   pentafluoroprop-tetrafluoroeth (GEBAUERS) aerosol 1 Application, 1 Application, Topical, PRN, Mdala-Gausi, Masiku Agatha, MD   sevelamer  carbonate (RENVELA ) tablet 3,200 mg, 3,200 mg, Oral, TID WC, Mdala-Gausi, Masiku Agatha, MD, 3,200 mg at 10/27/23 1322   sodium chloride  flush (NS) 0.9 % injection 10-40 mL, 10-40 mL, Intracatheter, Q12H, Mdala-Gausi, Masiku Agatha, MD, 10 mL at 10/27/23 1000   sodium chloride  flush (NS) 0.9 % injection 10-40 mL, 10-40 mL, Intracatheter, PRN, Mdala-Gausi, Masiku Agatha, MD   [COMPLETED] thiamine  (VITAMIN B1) 500 mg in sodium chloride  0.9 % 50 mL IVPB, 500 mg, Intravenous, Once, Stopped at 10/25/23 1845 **FOLLOWED BY** thiamine  (VITAMIN B1) 250 mg in sodium chloride  0.9 % 50 mL IVPB, 250 mg, Intravenous, Q24H, Last Rate: 105 mL/hr at 10/27/23 1307, 250 mg at 10/27/23 1307 **FOLLOWED BY** [START ON 10/31/2023] thiamine  (VITAMIN B1) tablet 100 mg, 100 mg, Oral, Daily, Mdala-Gausi, Masiku Lavern Potash, MD  Vitals   Vitals:   10/26/23 2320 10/27/23 0435 10/27/23 1028 10/27/23 1233  BP: (!) 153/96 115/74 106/70 101/63  Pulse: 99 85 78 72  Resp:   16 19  Temp: 98.7 F (37.1 C) 98.7 F (37.1 C) 98.2 F (36.8 C) 98.8 F (37.1 C)  TempSrc: Axillary Axillary Oral Oral  SpO2: 95% 92% 97% 95%  Weight:      Height:        Body mass index is 33.33 kg/m.  Physical Exam   Constitutional: Appears well-developed, well-nourished Psych: Affect appropriate to situation.  Eyes: No scleral injection.  HENT: No OP obstruction.  Head:  Normocephalic. Cardiovascular: Normal rate and regular rhythm.  Respiratory: Effort normal, non-labored breathing.  GI: Soft.  No distension. There is no tenderness.  Skin: WDI.   Neurologic Examination   Neurological Exam:  Mental status/Cognition:  Alert, arousable, oriented to person. Disoriented to age, month, year. Pt has waning attention and concentration. Pt is able to follow intermittently able to follow step commands, some amnesia.   Speech/language: Patient does not have dysarthria, normal volume, normal fluency  Cranial nerves:   CN II Pupils equal and reactive to light, pt blinks to threat bilaterally.   CN III,IV,VI EOM intact, no gaze preference or deviation, no nystagmus   CN V normal sensation in V1, V2, and V3 segments bilaterally   CN VII no asymmetry, no nasolabial fold flattening   CN VIII Makes eye contact to speech.   CN IX & X normal palatal elevation, no uvular deviation   CN XI 5/5 head turn and 5/5 shoulder shrug bilaterally   CN XII midline tongue  protrusion    Motor:  Muscle bulk: Normal, tone normal  Strength Testing Left Right  Head turn 5/5 5/5  Shoulder shrug (C5) 5/5 5/5  Hand grip (C8-T1) 5/5 5/5  Deltoid Up (C5-C6) 5/5 5/5  Deltoid Down (C5-C6) 5/5 5/5  Push (C7) 5/5 5/5  Pull (C5-C6) 5/5 5/5  Thigh Raise (L4-S2) 5/5 5/5  Knee Extension (L3-L4) 5/5 5/5  Knee Flexion (L5-S1) 5/5 5/5  Ankle Dorsiflexion (L4-L5) 5/5 5/5  Ankle Plantarflexion (S1-S2) 5/5 5/5        Sensation: Intact throughout to light touch equal in all extremities   Coordination/Complex Motor:  - Gait: Deferred  Labs/Imaging/Neurodiagnostic studies   CBC:  Recent Labs  Lab 10-31-23 1410 10/23/23 0830 10/25/23 0646 10/26/23 0732 10/27/23 0711  WBC 7.3   < > 10.8* 9.8 7.7  NEUTROABS 5.2  --  7.7  --   --   HGB 10.3*   < > 13.1 13.7 13.0  HCT 31.9*   < > 38.2* 40.7 39.1  MCV 94.9   < > 91.2 90.8 92.7  PLT 117*   < > 126* 106* 104*   < > = values in this  interval not displayed.   Basic Metabolic Panel:  Lab Results  Component Value Date   NA 137 10/27/2023   K 4.3 10/27/2023   CO2 24 10/27/2023   GLUCOSE 95 10/27/2023   BUN 62 (H) 10/27/2023   CREATININE 18.16 (H) 10/27/2023   CALCIUM  9.7 10/27/2023   GFRNONAA 3 (L) 10/27/2023   GFRAA 4 (L) 01/14/2020   Lipid Panel:  Lab Results  Component Value Date   LDLCALC 105 (H) 01/27/2023   HgbA1c:  Lab Results  Component Value Date   HGBA1C 5.2 01/19/2023   Urine Drug Screen:     Component Value Date/Time   LABOPIA NONE DETECTED 02/15/2009 1435   COCAINSCRNUR NONE DETECTED 02/15/2009 1435   LABBENZ NONE DETECTED 02/15/2009 1435   AMPHETMU NONE DETECTED 02/15/2009 1435   THCU NONE DETECTED 02/15/2009 1435   LABBARB  02/15/2009 1435    NONE DETECTED        DRUG SCREEN FOR MEDICAL PURPOSES ONLY.  IF CONFIRMATION IS NEEDED FOR ANY PURPOSE, NOTIFY LAB WITHIN 5 DAYS.        LOWEST DETECTABLE LIMITS FOR URINE DRUG SCREEN Drug Class       Cutoff (ng/mL) Amphetamine      1000 Barbiturate      200 Benzodiazepine   200 Tricyclics       300 Opiates          300 Cocaine          300 THC              50    Alcohol Level     Component Value Date/Time   ETH <15 Oct 31, 2023 0415   INR  Lab Results  Component Value Date   INR 2.5 (H) 04/20/2023   APTT  Lab Results  Component Value Date   APTT 57 (H) 09/20/2018   AED levels:  Lab Results  Component Value Date   PHENYTOIN  <2.5 REPEATED TO VERIFY (L) 06/09/2010   LAMOTRIGINE  <1.0 (L) 08/01/2023   LEVETIRACETA 47.4 (H) Oct 31, 2023   CT Head without contrast(Personally reviewed): 10/20/23 1. No acute intracranial abnormality. Multiple chronic infarcts.   MRI Brain:(5/6) 1. No evidence of acute intracranial abnormality. 2. Multiple remote infarcts and advanced chronic microvascular ischemic disease.  Neurodiagnostics rEEG: 5/5 report  This study is suggestive of  moderate diffuse encephalopathy.  Sharp transients were  noted in the right frontal region.     Because EEG showed rhythmic slowing as well as sharp transients, would recommend long-term EEG monitoring for further evaluation.  LTM EEG: 5/10 report  ABNORMALITY - Sharp waves, left fronto-temporal region - Sharp waves, right fronto-temporal region - Continuous slow, generalized and maximal bifrontal   IMPRESSION: This technically difficult study showed independent epileptogenicity arising from left and right fronto-temporal region. Additionally there was cortical dysfunction in bifrontal region likely secondary to underlying structural abnormality. Lastly there was moderate diffuse encephalopathy. No seizures were noted.  ASSESSMENT   [name] [name] is a 43 y.o. male with hx of ESRD on hemodialysis, hypertension, prior stroke, history of seizures, lupus anticoagulant on Eiquis was brought to the ER for seizure and altered mental status.  Continues to be encephalopathic. LTM EEG initially negative. MRI Brain with no acute abnormalitis. With persistent confusion, repeat LTM EEG obtained which showed a R frontal subclinical seizure.  Was on LTM again overnight, significantly poor quality study. No seizures noted. Mentation slightly improved today.  RECOMMENDATIONS  - discontinued LTM EEG, no seizure noted overnight. - MRI Brain obtained ahnd shows embolic strokes despite eliquis . Stroke workup pending. - Continue with Keppra  at 500 mg twice daily, max for ESRD. Will add additional Keppra  500 on days of HD - continue Valproic acid to 500mg  TID. Will get VPA levels normal, ammonia normal. - Continue Thiamine  100 mg daily  ______________________________________________________________________  Signed,  Emmert Roethler, MD [facility] [phone]   If 7pm to 7am, please call on call as listed on AMION.

## 2023-10-27 NOTE — Plan of Care (Signed)
  Problem: Clinical Measurements: Goal: Ability to maintain clinical measurements within normal limits will improve Outcome: Progressing Goal: Will remain free from infection Outcome: Progressing Goal: Diagnostic test results will improve Outcome: Progressing Goal: Respiratory complications will improve Outcome: Progressing Goal: Cardiovascular complication will be avoided Outcome: Progressing   Problem: Nutrition: Goal: Adequate nutrition will be maintained Outcome: Progressing   Problem: Coping: Goal: Level of anxiety will decrease Outcome: Progressing   Problem: Elimination: Goal: Will not experience complications related to bowel motility Outcome: Progressing Goal: Will not experience complications related to urinary retention Outcome: Progressing   Problem: Pain Managment: Goal: General experience of comfort will improve and/or be controlled Outcome: Progressing   Problem: Safety: Goal: Ability to remain free from injury will improve Outcome: Progressing   Problem: Skin Integrity: Goal: Risk for impaired skin integrity will decrease Outcome: Progressing   Problem: Education: Goal: Knowledge of disease and its progression will improve Outcome: Progressing Goal: Individualized Educational Video(s) Outcome: Progressing   Problem: Fluid Volume: Goal: Compliance with measures to maintain balanced fluid volume will improve Outcome: Progressing   Problem: Health Behavior/Discharge Planning: Goal: Ability to manage health-related needs will improve Outcome: Progressing   Problem: Nutritional: Goal: Ability to make healthy dietary choices will improve Outcome: Progressing   Problem: Clinical Measurements: Goal: Complications related to the disease process, condition or treatment will be avoided or minimized Outcome: Progressing

## 2023-10-27 NOTE — Progress Notes (Signed)
  [facility] Progress Note   Subjective:    Seen in room Pt not responding to questions   Objective Vitals:   10/27/23 0435 10/27/23 1028 10/27/23 1233 10/27/23 1540  BP: 115/74 106/70 101/63 101/60  Pulse: 85 78 72 81  Resp:  16 19 14   Temp: 98.7 F (37.1 C) 98.2 F (36.8 C) 98.8 F (37.1 C) 98 F (36.7 C)  TempSrc: Axillary Oral Oral Oral  SpO2: 92% 97% 95% 97%  Weight:      Height:       Physical Exam General: Eyes open slightly but not responding, NAD, in restraints Heart:S1 and S2; No murmurs, gallops, and rubs Lungs: Clear anteriorly Abdomen: Soft and non-tender Extremities: no LE edema Neuro: Not responding  Dialysis Access: R TDC    Dialysis Orders: MWF [facility] 4h   B400   110.5kg  RIJ TDC   Heparin  none Mircera: none Calcitriol  2.5mcg PO qHD - last dose 10/19/23 Sensipar  150mg  with HD - last dose 10/19/23  Assessment/Plan: Seizure disorder - Neurology following; EEG suggestive of moderate diffuse encephalopathy, no seizures noted and MRI brain no acute changes. Had seizures on HD 5/09. Is on keppra , depakote and vimpat now. Per neuro/ pmd.  Acute encephalopathy - as above, not waking up, possibly some hypoxic brain injury related to the seizures. Neuro following.  ESRD -  on HD MWF. Next HD Monday.  HTN/ volume  - Blood pressures dropped after last HD. Have dc'd norvasc  and coreg  for now given BP's in the 100/70 range. Keep even w/ next HD. Will give NS bolus 500 cc.  Anemia of CKD - Current Hgb at goal. No Fe/ESA is indicated at this time. Secondary Hyperparathyroidism -  Resumed Calcitriol  and Sensipar  Nutrition - Renal diet with fluid restriction  Larry Poag  MD  [facility] 10/27/2023, 8:41 PM  Recent Labs  Lab 10/21/23 1410 10/21/23 1410 10/23/23 0830 10/23/23 2032 10/25/23 0646 10/26/23 0732 10/27/23 0711  HGB 10.3*  --  11.5*  --  13.1 13.7 13.0  ALBUMIN  3.2*  --  3.3*  --   --   --   --   CALCIUM  9.2  --  9.6   < > 9.6 10.3 9.7  PHOS  --     < > 7.6*  --  7.5* 7.8*  --   CREATININE 18.76*  --  17.76*   < > 17.39* 13.86* 18.16*  K 4.1  --  4.1   < > 4.1 4.6 4.3   < > = values in this interval not displayed.    Inpatient medications:  apixaban   5 mg Oral BID   atorvastatin   40 mg Oral Daily   calcitRIOL   2.5 mcg Oral Q M,W,F-HD   cinacalcet   150 mg Oral Q M,W,F-HD   divalproex  500 mg Oral Q8H   feeding supplement (NEPRO CARB STEADY)  237 mL Oral BID BM   levETIRAcetam   500 mg Oral BID   [START ON 10/28/2023] levETIRAcetam   500 mg Oral Q M,W,F-HD   levETIRAcetam   500 mg Intravenous Q M,W,F   multivitamin  1 tablet Oral QHS   sevelamer  carbonate  3,200 mg Oral TID WC   sodium chloride  flush  10-40 mL Intracatheter Q12H   [START ON 10/31/2023] thiamine   100 mg Oral Daily    thiamine  (VITAMIN B1) injection 250 mg (10/27/23 1307)   alteplase , heparin , hydrALAZINE , lidocaine  (PF), lidocaine -prilocaine , LORazepam , pentafluoroprop-tetrafluoroeth, sodium chloride  flush

## 2023-10-27 NOTE — Plan of Care (Signed)
  Problem: Education: Goal: Knowledge of General Education information will improve Description: Including pain rating scale, medication(s)/side effects and non-pharmacologic comfort measures Outcome: Not Progressing   Problem: Health Behavior/Discharge Planning: Goal: Ability to manage health-related needs will improve Outcome: Not Progressing   Problem: Clinical Measurements: Goal: Ability to maintain clinical measurements within normal limits will improve Outcome: Progressing Goal: Will remain free from infection Outcome: Progressing Goal: Diagnostic test results will improve Outcome: Progressing Goal: Respiratory complications will improve Outcome: Progressing Goal: Cardiovascular complication will be avoided Outcome: Progressing   Problem: Activity: Goal: Risk for activity intolerance will decrease Outcome: Not Progressing   Problem: Nutrition: Goal: Adequate nutrition will be maintained Outcome: Progressing   Problem: Coping: Goal: Level of anxiety will decrease Outcome: Progressing   Problem: Elimination: Goal: Will not experience complications related to bowel motility Outcome: Progressing Goal: Will not experience complications related to urinary retention Outcome: Progressing   Problem: Safety: Goal: Ability to remain free from injury will improve Outcome: Progressing   Problem: Skin Integrity: Goal: Risk for impaired skin integrity will decrease Outcome: Progressing   Problem: Education: Goal: Knowledge of disease and its progression will improve Outcome: Progressing Goal: Individualized Educational Video(s) Outcome: Progressing   Problem: Health Behavior/Discharge Planning: Goal: Ability to manage health-related needs will improve Outcome: Progressing   Problem: Nutritional: Goal: Ability to make healthy dietary choices will improve Outcome: Progressing   Problem: Clinical Measurements: Goal: Complications related to the disease process,  condition or treatment will be avoided or minimized Outcome: Progressing

## 2023-10-27 NOTE — Progress Notes (Signed)
LTM EEG disconnected - no skin breakdown at unhook. [facility] notified.  

## 2023-10-27 NOTE — Progress Notes (Signed)
 Progress Note   Patient: [name] [name] UEA:[id number] DOB: 04-Aug-1980 DOA: 10/20/2023     6 DOS: the patient was seen and examined on 10/27/2023   Brief hospital course: 43 y.o. male with history of ESRD on hemodialysis MWF, hypertension, prior stroke, history of seizures, lupus anticoagulant brought to the ER on 5/5 after patient had a seizure.  Patient's friend was at the bedside and his roommate states that patient had a seizure on Oct 18, 2023 and he missed his dialysis due to that.  Later in the evening he had another seizure.  He did go to his dialysis on Saturday the following day.  But due to increased confusion patient was brought to the ER.  Initially in the ER patient signed out AMA but on the way out patient was confused and [facility] Department brought him back.  Per patient's friend patient last took his medication on Oct 17, 2023.    The patient has been seen by neurology and nephrology.  He he has had an EEG is suggestive of moderate diffuse encephalopathy.  Sharp transients were noted in the right frontal region.  Per neurology, "because EEG showed rhythmic slowing as well as sharp transients, would recommend long-term EEG monitoring for further evaluation." Patient had an MRI on 5/6 which showed no evidence of acute intracranial abnormality, and multiple remote infarcts.   Patient had another seizure while in hemodialysis on 5/9. Moved to PCU. Due to persistent encephalopathy, MRI was reordered on 5/11. This showed: 1.Multiple acute infarcts in the right frontal, parietal and posterior temporal cortex as well as the adjacent subcortical white matter (potentially watershed or right MCA territory). 2. A few punctate acute infarcts in the left basal ganglia, thalamus and overlying white matter. 3. Advanced chronic microvascular ischemic disease.  MRA angio head ordered.  Assessment and Plan:  Acute CVA Onset during this hospitalization.  MRI done 5/6 showed only old  strokes.  MRI done 5/11 shows new acute stroked.  Neurology following.  - MRA head ordered.  - TTE with bubble study.  - Check A1c, lipid panel - PT/OT  Acute encephalopathy Initially thought due to postictal state but has persisted.   Psychiatric disorder seems unlikely, given EEG findings. Patient has been seen by neurology who recommended an encephalopathy workup. TSH: WNL. HIV: Nonreactive. B12: 539. RPR, B1 pending. Per neurology, EEG notable for BL sharps as well as right frontal subclinical seizure. Neurology is continuing to adjust his antiseizure medications.   New strokes involving frontal region noted on MRI of 5/11. -Continue Wernicke's dose thiamine . - Continue AEDs per neurology. - Will continue workup per Neurology.   Seizure disorder Patient with known seizure disorder. He is on Keppra  at home. Of note, the patient has presented to the ED several times in the past few months with breakthrough seizures. EEG suggestive of moderate diffuse encephalopathy. Initial MRI showed multiple remote infarcts, no acute changes. New MRI shows acute strokes. Patient has been seen by neurology, who recommended an encephalopathy workup as well as high-dose thiamine . Patient had a breakthrough seizure on 5/9.   Patient received Vimpat load -Continue Keppra . -Continue Depakote. - Work up for acute stroke as above  ESRD on HD MWF. Nephrology consulted.  Input appreciated. - Continue dialysis per home schedule.  Uncontrolled hypertension Home medications include amlodipine , carvedilol . - Continue home medications.     Subjective: Patient is awake, alert and responsive. Seen ambulating in room with walker and 2-person assist.    Physical Exam: Vitals:  10/26/23 2320 10/27/23 0435 10/27/23 1028 10/27/23 1233  BP: (!) 153/96 115/74 106/70 101/63  Pulse: 99 85 78 72  Resp:   16 19  Temp: 98.7 F (37.1 C) 98.7 F (37.1 C) 98.2 F (36.8 C) 98.8 F (37.1 C)  TempSrc:  Axillary Axillary Oral Oral  SpO2: 95% 92% 97% 95%  Weight:      Height:       Physical Exam   General: Alert, oriented X2 Eyes: Pupils equal, reactive  Oral cavity: moist mucous membranes  Head: Atraumatic, normocephalic  Neck: supple  Chest: clear to auscultation. No crackles, no wheezes  CVS: S1,S2 RRR. No murmurs  Abd: No distention, soft, non-tender. No masses palpable  Extr: No edema   MSK: No joint deformities or swelling  Neurological: Grossly intact.    Data Reviewed:     Latest Ref Rng & Units 10/27/2023    7:11 AM 10/26/2023    7:32 AM 10/25/2023    6:46 AM  CBC  WBC 4.0 - 10.5 K/uL 7.7  9.8  10.8   Hemoglobin 13.0 - 17.0 g/dL 16.1  09.6  04.5   Hematocrit 39.0 - 52.0 % 39.1  40.7  38.2   Platelets 150 - 400 K/uL 104  106  126       Latest Ref Rng & Units 10/27/2023    7:11 AM 10/26/2023    7:32 AM 10/25/2023    6:46 AM  BMP  Glucose 70 - 99 mg/dL 95  84  409   BUN 6 - 20 mg/dL 62  42  55   Creatinine 0.61 - 1.24 mg/dL 81.19  14.78  29.56   Sodium 135 - 145 mmol/L 137  138  136   Potassium 3.5 - 5.1 mmol/L 4.3  4.6  4.1   Chloride 98 - 111 mmol/L 95  97  95   CO2 22 - 32 mmol/L 24  21  22    Calcium  8.9 - 10.3 mg/dL 9.7  21.3  9.6      Family Communication: Updated friend, Tamra Falling, at bedside.   Disposition: Status is: Inpatient Remains inpatient appropriate because: Ongoing encephalopathy   Planned Discharge Destination: Rehab  DVT PPX: systemic anticoagulation with Apixaban     Time spent: 35 minutes  Author: MDALA-GAUSI, Charde Macfarlane AGATHA, MD 10/27/2023 1:16 PM  For on call review [email or web address].

## 2023-10-28 ENCOUNTER — Inpatient Hospital Stay (HOSPITAL_COMMUNITY)

## 2023-10-28 ENCOUNTER — Other Ambulatory Visit (HOSPITAL_COMMUNITY)

## 2023-10-28 ENCOUNTER — Encounter (HOSPITAL_COMMUNITY)

## 2023-10-28 DIAGNOSIS — I639 Cerebral infarction, unspecified: Secondary | ICD-10-CM | POA: Diagnosis not present

## 2023-10-28 DIAGNOSIS — D6861 Antiphospholipid syndrome: Secondary | ICD-10-CM

## 2023-10-28 DIAGNOSIS — T45516A Underdosing of anticoagulants, initial encounter: Secondary | ICD-10-CM

## 2023-10-28 DIAGNOSIS — G9340 Encephalopathy, unspecified: Secondary | ICD-10-CM | POA: Diagnosis not present

## 2023-10-28 DIAGNOSIS — I6389 Other cerebral infarction: Secondary | ICD-10-CM | POA: Diagnosis not present

## 2023-10-28 DIAGNOSIS — M329 Systemic lupus erythematosus, unspecified: Secondary | ICD-10-CM

## 2023-10-28 DIAGNOSIS — I63511 Cerebral infarction due to unspecified occlusion or stenosis of right middle cerebral artery: Secondary | ICD-10-CM | POA: Diagnosis not present

## 2023-10-28 DIAGNOSIS — E785 Hyperlipidemia, unspecified: Secondary | ICD-10-CM

## 2023-10-28 LAB — LIPID PANEL
Cholesterol: 140 mg/dL (ref 0–200)
HDL: 26 mg/dL — ABNORMAL LOW (ref >40)
LDL Cholesterol: 75 mg/dL (ref 0–99)
Total CHOL/HDL Ratio: 5.4 ratio
Triglycerides: 193 mg/dL — ABNORMAL HIGH (ref <150)
VLDL: 39 mg/dL (ref 0–40)

## 2023-10-28 LAB — ECHOCARDIOGRAM COMPLETE BUBBLE STUDY
Area-P 1/2: 1.38 cm2
MV VTI: 1.39 cm2
S' Lateral: 2.5 cm

## 2023-10-28 LAB — CBC
HCT: 37.8 % — ABNORMAL LOW (ref 39.0–52.0)
Hemoglobin: 12.7 g/dL — ABNORMAL LOW (ref 13.0–17.0)
MCH: 31 pg (ref 26.0–34.0)
MCHC: 33.6 g/dL (ref 30.0–36.0)
MCV: 92.2 fL (ref 80.0–100.0)
Platelets: 95 10*3/uL — ABNORMAL LOW (ref 150–400)
RBC: 4.1 MIL/uL — ABNORMAL LOW (ref 4.22–5.81)
RDW: 13.4 % (ref 11.5–15.5)
WBC: 7.4 10*3/uL (ref 4.0–10.5)
nRBC: 0 % (ref 0.0–0.2)

## 2023-10-28 LAB — BASIC METABOLIC PANEL WITH GFR
Anion gap: 24 — ABNORMAL HIGH (ref 5–15)
BUN: 82 mg/dL — ABNORMAL HIGH (ref 6–20)
CO2: 22 mmol/L (ref 22–32)
Calcium: 10 mg/dL (ref 8.9–10.3)
Chloride: 92 mmol/L — ABNORMAL LOW (ref 98–111)
Creatinine, Ser: 20.54 mg/dL — ABNORMAL HIGH (ref 0.61–1.24)
GFR, Estimated: 3 mL/min — ABNORMAL LOW (ref >60)
Glucose, Bld: 77 mg/dL (ref 70–99)
Potassium: 4.3 mmol/L (ref 3.5–5.1)
Sodium: 138 mmol/L (ref 135–145)

## 2023-10-28 LAB — HEMOGLOBIN A1C
Hgb A1c MFr Bld: 5.6 % (ref 4.8–5.6)
Mean Plasma Glucose: 114 mg/dL

## 2023-10-28 MED ORDER — TRIMETHOBENZAMIDE HCL 100 MG/ML IM SOLN
200.0000 mg | Freq: Once | INTRAMUSCULAR | Status: DC | PRN
  Filled 2023-10-28: qty 2

## 2023-10-28 MED ORDER — HEPARIN SODIUM (PORCINE) 1000 UNIT/ML IJ SOLN
INTRAMUSCULAR | Status: AC
  Filled 2023-10-28: qty 3

## 2023-10-28 MED ORDER — STROKE: EARLY STAGES OF RECOVERY BOOK
Freq: Once | Status: AC
  Filled 2023-10-28 (×2): qty 1

## 2023-10-28 MED ORDER — DIVALPROEX SODIUM 500 MG PO DR TAB
1000.0000 mg | DELAYED_RELEASE_TABLET | Freq: Two times a day (BID) | ORAL | Status: DC
  Administered 2023-10-28 – 2023-11-12 (×29): 1000 mg via ORAL
  Filled 2023-10-28: qty 4
  Filled 2023-10-28: qty 2
  Filled 2023-10-28 (×3): qty 4
  Filled 2023-10-28 (×4): qty 2
  Filled 2023-10-28: qty 4
  Filled 2023-10-28 (×3): qty 2
  Filled 2023-10-28: qty 4
  Filled 2023-10-28 (×4): qty 2
  Filled 2023-10-28: qty 4
  Filled 2023-10-28: qty 2
  Filled 2023-10-28 (×2): qty 4
  Filled 2023-10-28 (×2): qty 2
  Filled 2023-10-28 (×3): qty 4
  Filled 2023-10-28: qty 2
  Filled 2023-10-28 (×2): qty 4
  Filled 2023-10-28 (×3): qty 2
  Filled 2023-10-28: qty 4
  Filled 2023-10-28 (×2): qty 2
  Filled 2023-10-28 (×2): qty 4
  Filled 2023-10-28: qty 2
  Filled 2023-10-28 (×2): qty 4
  Filled 2023-10-28: qty 2
  Filled 2023-10-28: qty 4
  Filled 2023-10-28: qty 2
  Filled 2023-10-28 (×4): qty 4
  Filled 2023-10-28: qty 2
  Filled 2023-10-28: qty 4
  Filled 2023-10-28: qty 2
  Filled 2023-10-28: qty 4
  Filled 2023-10-28 (×2): qty 2
  Filled 2023-10-28: qty 4
  Filled 2023-10-28: qty 2

## 2023-10-28 NOTE — Plan of Care (Signed)
  Problem: Education: Goal: Knowledge of General Education information will improve Description: Including pain rating scale, medication(s)/side effects and non-pharmacologic comfort measures Outcome: Not Progressing   Problem: Health Behavior/Discharge Planning: Goal: Ability to manage health-related needs will improve Outcome: Not Progressing   Problem: Clinical Measurements: Goal: Ability to maintain clinical measurements within normal limits will improve Outcome: Progressing Goal: Will remain free from infection Outcome: Progressing Goal: Diagnostic test results will improve Outcome: Progressing Goal: Respiratory complications will improve Outcome: Progressing Goal: Cardiovascular complication will be avoided Outcome: Progressing   Problem: Activity: Goal: Risk for activity intolerance will decrease Outcome: Progressing   Problem: Nutrition: Goal: Adequate nutrition will be maintained Outcome: Progressing   Problem: Coping: Goal: Level of anxiety will decrease Outcome: Progressing   Problem: Elimination: Goal: Will not experience complications related to bowel motility Outcome: Progressing Goal: Will not experience complications related to urinary retention Outcome: Progressing   Problem: Pain Managment: Goal: General experience of comfort will improve and/or be controlled Outcome: Progressing   Problem: Safety: Goal: Ability to remain free from injury will improve Outcome: Progressing   Problem: Education: Goal: Knowledge of disease and its progression will improve Outcome: Progressing Goal: Individualized Educational Video(s) Outcome: Progressing   Problem: Fluid Volume: Goal: Compliance with measures to maintain balanced fluid volume will improve Outcome: Progressing   Problem: Health Behavior/Discharge Planning: Goal: Ability to manage health-related needs will improve Outcome: Not Progressing   Problem: Nutritional: Goal: Ability to make healthy  dietary choices will improve Outcome: Not Progressing   Problem: Clinical Measurements: Goal: Complications related to the disease process, condition or treatment will be avoided or minimized Outcome: Progressing

## 2023-10-28 NOTE — Care Management Important Message (Signed)
 Important Message  Patient Details  Name: [name] [name] MRN: [id number] Date of Birth: 14-May-1981   Important Message Given:  Yes - Medicare IM     Wynonia Hedges 10/28/2023, 2:58 PM

## 2023-10-28 NOTE — Progress Notes (Signed)
  [facility] Progress Note   Subjective:    Seen in HD this am Much more responsive today   Objective Vitals:   10/28/23 1212 10/28/23 1241 10/28/23 1256 10/28/23 1400  BP: 106/66 114/79 117/81 119/80  Pulse: 73 77 73 76  Resp: 12 16 11 14   Temp:   98.3 F (36.8 C) 97.8 F (36.6 C)  TempSrc:    Oral  SpO2: 93% 95% 95% 97%  Weight:   106.7 kg   Height:       Physical Exam General: alert, responsive today, much better Heart:S1 and S2; No murmurs, gallops, and rubs Lungs: Clear anteriorly Abdomen: Soft and non-tender Extremities: no LE edema Dialysis Access: R TDC    Dialysis Orders: MWF [facility] 4h   B400   110.5kg  RIJ TDC   Heparin  none Mircera: none Calcitriol  2.5mcg PO qHD - last dose 10/19/23 Sensipar  150mg  with HD - last dose 10/19/23  Assessment/Plan: Seizure disorder - Neurology following; EEG suggestive of moderate diffuse encephalopathy, no seizures noted and MRI brain no acute changes. Had seizures on HD 5/09. Is on keppra , depakote and vimpat now. Per neuro/ pmd.  Acute encephalopathy - as above, is now waking up and conversing w/o difficulty. Much better.  ESRD -  on HD MWF. Next HD today.  HTN/ volume  - bp's dropped so norvasc  and coreg  were dc'd. Keeping even w/ next HD. Had not been eating, gave bolus NS yest.  Anemia of CKD - Current Hgb at goal. No Fe/ESA is indicated at this time. Secondary hyperparathyroidism -  cont calcitriol  + sensipar  and binder Nutrition - Renal diet with fluid restriction  [name] Poag  MD  [facility] 10/28/2023, 4:05 PM  Recent Labs  Lab 10/23/23 0830 10/23/23 2032 10/25/23 0646 10/26/23 0732 10/27/23 0711 10/28/23 0709  HGB 11.5*  --  13.1 13.7 13.0 12.7*  ALBUMIN  3.3*  --   --   --   --   --   CALCIUM  9.6   < > 9.6 10.3 9.7 10.0  PHOS 7.6*  --  7.5* 7.8*  --   --   CREATININE 17.76*   < > 17.39* 13.86* 18.16* 20.54*  K 4.1   < > 4.1 4.6 4.3 4.3   < > = values in this interval not displayed.    Inpatient  medications:   stroke: early stages of recovery book   Does not apply Once   apixaban   5 mg Oral BID   atorvastatin   40 mg Oral Daily   calcitRIOL   2.5 mcg Oral Q M,W,F-HD   cinacalcet   150 mg Oral Q M,W,F-HD   divalproex  500 mg Oral Q8H   feeding supplement (NEPRO CARB STEADY)  237 mL Oral BID BM   levETIRAcetam   500 mg Oral BID   levETIRAcetam   500 mg Oral Q M,W,F-HD   multivitamin  1 tablet Oral QHS   sevelamer  carbonate  3,200 mg Oral TID WC   sodium chloride  flush  10-40 mL Intracatheter Q12H     alteplase , heparin , hydrALAZINE , lidocaine  (PF), lidocaine -prilocaine , LORazepam , pentafluoroprop-tetrafluoroeth, sodium chloride  flush, trimethobenzamide

## 2023-10-28 NOTE — Progress Notes (Addendum)
 Progress Note   Patient: [name] [name]:[id number] DOB: [birth year] DOA: 10/20/2023     7 DOS: the patient was seen and examined on 10/28/2023   Brief hospital course: 43 y.o. male with history of ESRD on hemodialysis MWF, hypertension, prior stroke, history of seizures, lupus anticoagulant brought to the ER on 5/5 after patient had a seizure.  Patient's friend was at the bedside and his roommate states that patient had a seizure on Oct 18, 2023 and he missed his dialysis due to that.  Later in the evening he had another seizure.  He did go to his dialysis on Saturday the following day.  But due to increased confusion patient was brought to the ER.  Initially in the ER patient signed out AMA but on the way out patient was confused and [facility] Department brought him back.  Per patient's friend patient last took his medication on Oct 17, 2023.    The patient has been seen by neurology and nephrology.  He he has had an EEG is suggestive of moderate diffuse encephalopathy.  Sharp transients were noted in the right frontal region.  Per neurology, "because EEG showed rhythmic slowing as well as sharp transients, would recommend long-term EEG monitoring for further evaluation." Patient had an MRI on 5/6 which showed no evidence of acute intracranial abnormality, and multiple remote infarcts.   Patient had another seizure while in hemodialysis on 5/9. Moved to PCU. Due to persistent encephalopathy, MRI was reordered on 5/11. This showed: 1.Multiple acute infarcts in the right frontal, parietal and posterior temporal cortex as well as the adjacent subcortical white matter (potentially watershed or right MCA territory). 2. A few punctate acute infarcts in the left basal ganglia, thalamus and overlying white matter. 3. Advanced chronic microvascular ischemic disease.  MRA angio head ordered.  Assessment and Plan:  Acute CVA Onset during this hospitalization.  MRI done 5/6 showed only old  strokes.  MRI done 5/11 shows new acute strokes. MRA showed patency of major intracranial arteries. A1c 5.6. Total cholesterol: 140.  LDL: 75.  HDL: 26. Neurology following.  -Follow-up TTE with bubble study.  - Follow up carotid US . - PT/OT eval pending. -SLP following.  Acute encephalopathy Initially thought due to postictal state, but persisted.   Given recent MRI findings, it seems most likely that the encephalopathy is due to strokes involving the frontal region. TSH: WNL. HIV: Nonreactive. B12: 539. RPR nonreactive. B1: Within normal limits. Per neurology, EEG notable for BL sharps as well as right frontal subclinical seizure. New strokes involving frontal region noted on MRI of 5/11. -Discontinue Wernicke's dose thiamine . - Continue AEDs per neurology. -Continue supportive care.  Seizure disorder Patient with known seizure disorder. He is on Keppra  at home. Of note, the patient has presented to the ED several times in the past few months with breakthrough seizures. EEG suggestive of moderate diffuse encephalopathy. Initial MRI showed multiple remote infarcts, no acute changes. New MRI shows acute strokes. Neurology following. Patient had a breakthrough seizure on 5/9.   Patient received Vimpat load.  Medications adjusted. -Continue Keppra . -Continue Depakote.  ESRD on HD MWF. Nephrology consulted.  Input appreciated. - Continue dialysis per home schedule.  Hypertension Home medications include amlodipine , carvedilol . - Continue home medications.  Functional impairment Due to stroke. - PT/OT evaluation pending.     Subjective: Patient is awake, alert and responsive.  He is oriented x 2.  Mental status not back to baseline.   Physical Exam: Vitals:  10/28/23 1010 10/28/23 1040 10/28/23 1056 10/28/23 1111  BP: 125/86 136/89 130/77 122/83  Pulse: 80 84 77 77  Resp: (!) 23 18 14 17   Temp:      TempSrc:      SpO2: 94% 99% 100% 97%  Weight:       Height:       Physical Exam   General: Alert, oriented X2 Eyes: Pupils equal, reactive  Oral cavity: moist mucous membranes  Head: Atraumatic, normocephalic  Neck: supple  Chest: clear to auscultation. No crackles, no wheezes.  Dialysis catheter right chest wall. CVS: S1,S2 RRR. No murmurs  Abd: No distention, soft, non-tender. No masses palpable  Extr: No edema   MSK: No joint deformities or swelling  Neurological: Grossly intact.    Data Reviewed:     Latest Ref Rng & Units 10/27/2023    7:11 AM 10/26/2023    7:32 AM 10/25/2023    6:46 AM  CBC  WBC 4.0 - 10.5 K/uL 7.7  9.8  10.8   Hemoglobin 13.0 - 17.0 g/dL 40.9  81.1  91.4   Hematocrit 39.0 - 52.0 % 39.1  40.7  38.2   Platelets 150 - 400 K/uL 104  106  126       Latest Ref Rng & Units 10/28/2023    7:09 AM 10/27/2023    7:11 AM 10/26/2023    7:32 AM  BMP  Glucose 70 - 99 mg/dL 77  95  84   BUN 6 - 20 mg/dL 82  62  42   Creatinine 0.61 - 1.24 mg/dL 78.29  56.21  30.86   Sodium 135 - 145 mmol/L 138  137  138   Potassium 3.5 - 5.1 mmol/L 4.3  4.3  4.6   Chloride 98 - 111 mmol/L 92  95  97   CO2 22 - 32 mmol/L 22  24  21    Calcium  8.9 - 10.3 mg/dL 57.8  9.7  46.9      Family Communication: n/a  Disposition: Status is: Inpatient Remains inpatient appropriate because: Ongoing encephalopathy   Planned Discharge Destination: Rehab  DVT PPX: systemic anticoagulation with Apixaban     Time spent: 35 minutes  Author: MDALA-GAUSI, Neftaly Inzunza AGATHA, MD 10/28/2023 12:48 PM  For on call review [email or web address].

## 2023-10-28 NOTE — Progress Notes (Signed)
 Patient is back to the unit, alert and oriented X2, vital signs stable, will continue to monitor

## 2023-10-28 NOTE — Progress Notes (Signed)
  Echocardiogram 2D Echocardiogram has been performed.  [name] [name] 10/28/2023, 6:11 PM

## 2023-10-28 NOTE — Progress Notes (Signed)
   10/28/23 1256  Vitals  Temp 98.3 F (36.8 C)  Pulse Rate 73  Resp 11  BP 117/81  SpO2 95 %  O2 Device Nasal Cannula  Weight 106.7 kg  Type of Weight Post-Dialysis  Oxygen Therapy  O2 Flow Rate (L/min) 1 L/min  Post Treatment  Hemodialysis Intake (mL) 0 mL  Liters Processed 77.6  Fluid Removed (mL) 0 mL  Tolerated HD Treatment Yes   Received patient in bed to unit.  Alert and oriented.  Informed consent signed and in chart.   TX duration: Three hours and thirty minutes  Patient tolerated well.  Transported back to the room  Alert, without acute distress.  Hand-off given to patient's nurse.   Access used: Right HD Chest CVC Access issues: None Medication(s) given: divalproex (DEPAKOTE) DR tablet 500 mg

## 2023-10-28 NOTE — Progress Notes (Signed)
 OT Cancellation Note  Patient Details Name: [name] [name] MRN: [id number] DOB: 1980/11/14   Cancelled Treatment:    Reason Eval/Treat Not Completed: Patient at procedure or test/ unavailable Patient off floor at HD, OT will complete evaluation as time permits.   Mollie Anger E. Alquan Morrish, OTR/L Acute Rehabilitation Services [phone]   Vincent Greek 10/28/2023, 8:49 AM

## 2023-10-28 NOTE — Progress Notes (Signed)
 PT Cancellation Note  Patient Details Name: JONATHANDAVID MUNKRES MRN: [id number] DOB: 11/09/1980   Cancelled Treatment:    Reason Eval/Treat Not Completed: Patient at procedure or test/unavailable (Pt off the floor at HD. Will follow up later if time allows.)   Xandra Laramee 10/28/2023, 8:54 AM

## 2023-10-28 NOTE — Progress Notes (Signed)
Patient off the unit to HD

## 2023-10-28 NOTE — Progress Notes (Signed)
 SLP Cancellation Note  Patient Details Name: [name] [name] MRN: [id number] DOB: Jun 21, 1980   Cancelled treatment:       Reason Eval/Treat Not Completed: Patient at procedure or test/unavailable (HD). SLP will f/u.    [name], M.A., CCC-SLP Speech Language Pathology, Acute Rehabilitation Services  Secure Chat preferred [phone]  10/28/2023, 9:09 AM

## 2023-10-28 NOTE — Progress Notes (Signed)
 PT Cancellation Note  Patient Details Name: [name] [name] MRN: [id number] DOB: 04-04-1981   Cancelled Treatment:    Reason Eval/Treat Not Completed: Other (comment). Nursing staff assisting the pt in eating at the time of PT arrival. PT will follow up tomorrow.   Rexie Catena 10/28/2023, 4:25 PM

## 2023-10-28 NOTE — Progress Notes (Signed)
 STROKE TEAM PROGRESS NOTE   SUBJECTIVE (INTERVAL HISTORY) His RN is at the bedside.  Pt lying in bed, psychomotor slowing, still have mild left-sided weakness, chronic.  Per pharmacy record, not compliant with anticoagulation at home.  Depakote level low this morning.   OBJECTIVE Temp:  [97.8 F (36.6 C)-98.8 F (37.1 C)] 97.8 F (36.6 C) (05/12 1400) Pulse Rate:  [67-84] 76 (05/12 1400) Cardiac Rhythm: Normal sinus rhythm (05/12 0830) Resp:  [11-23] 14 (05/12 1400) BP: (97-136)/(60-92) 119/80 (05/12 1400) SpO2:  [93 %-100 %] 97 % (05/12 1400) Weight:  [106.7 kg] 106.7 kg (05/12 1256)  Recent Labs  Lab 10/22/23 1123  GLUCAP 94   Recent Labs  Lab 10/23/23 0830 10/23/23 2032 10/24/23 0559 10/25/23 0646 10/26/23 0732 10/27/23 0711 10/28/23 0709  NA 139   < > 135 136 138 137 138  K 4.1   < > 4.5 4.1 4.6 4.3 4.3  CL 99   < > 96* 95* 97* 95* 92*  CO2 26   < > 22 22 21* 24 22  GLUCOSE 93   < > 92 108* 84 95 77  BUN 46*   < > 33* 55* 42* 62* 82*  CREATININE 17.76*   < > 13.33* 17.39* 13.86* 18.16* 20.54*  CALCIUM  9.6   < > 9.4 9.6 10.3 9.7 10.0  MG  --   --   --   --  2.4  --   --   PHOS 7.6*  --   --  7.5* 7.8*  --   --    < > = values in this interval not displayed.   Recent Labs  Lab 10/23/23 0830  ALBUMIN  3.3*   Recent Labs  Lab 10/23/23 0830 10/25/23 0646 10/26/23 0732 10/27/23 0711 10/28/23 0709  WBC 8.2 10.8* 9.8 7.7 7.4  NEUTROABS  --  7.7  --   --   --   HGB 11.5* 13.1 13.7 13.0 12.7*  HCT 35.2* 38.2* 40.7 39.1 37.8*  MCV 92.9 91.2 90.8 92.7 92.2  PLT 144* 126* 106* 104* 95*   No results for input(s): "CKTOTAL", "CKMB", "CKMBINDEX", "TROPONINI" in the last 168 hours. No results for input(s): "LABPROT", "INR" in the last 72 hours. No results for input(s): "COLORURINE", "LABSPEC", "PHURINE", "GLUCOSEU", "HGBUR", "BILIRUBINUR", "KETONESUR", "PROTEINUR", "UROBILINOGEN", "NITRITE", "LEUKOCYTESUR" in the last 72 hours.  Invalid input(s): "APPERANCEUR"      Component Value Date/Time   CHOL 140 10/28/2023 0709   CHOL 194 09/27/2020 1140   TRIG 193 (H) 10/28/2023 0709   HDL 26 (L) 10/28/2023 0709   HDL 56 09/27/2020 1140   CHOLHDL 5.4 10/28/2023 0709   VLDL 39 10/28/2023 0709   LDLCALC 75 10/28/2023 0709   LDLCALC 114 (H) 09/27/2020 1140   Lab Results  Component Value Date   HGBA1C 5.6 10/27/2023      Component Value Date/Time   LABOPIA NONE DETECTED 02/15/2009 1435   COCAINSCRNUR NONE DETECTED 02/15/2009 1435   LABBENZ NONE DETECTED 02/15/2009 1435   AMPHETMU NONE DETECTED 02/15/2009 1435   THCU NONE DETECTED 02/15/2009 1435   LABBARB  02/15/2009 1435    NONE DETECTED        DRUG SCREEN FOR MEDICAL PURPOSES ONLY.  IF CONFIRMATION IS NEEDED FOR ANY PURPOSE, NOTIFY LAB WITHIN 5 DAYS.        LOWEST DETECTABLE LIMITS FOR URINE DRUG SCREEN Drug Class       Cutoff (ng/mL) Amphetamine      1000 Barbiturate  200 Benzodiazepine   200 Tricyclics       300 Opiates          300 Cocaine          300 THC              50    No results for input(s): "ETH" in the last 168 hours.  I have personally reviewed the radiological images below and agree with the radiology interpretations.  MR ANGIO HEAD WO CONTRAST Result Date: 10/27/2023 CLINICAL DATA:  Stroke suspected EXAM: MRA HEAD WITHOUT CONTRAST TECHNIQUE: Angiographic images of the Circle of Willis were acquired using MRA technique without intravenous contrast. COMPARISON:  Brain MRI from earlier today FINDINGS: Severe motion artifact, nondiagnostic except showing patency of the carotid, vertebral, basilar, and major proximal branches. Cannot assess for stenosis or aneurysm. IMPRESSION: Severe motion artifact, non diagnostic other than documenting patency of the major intracranial arteries. Electronically Signed   By: Ronnette Coke M.D.   On: 10/27/2023 12:54   MR BRAIN WO CONTRAST Result Date: 10/27/2023 CLINICAL DATA:  Mental status change, unknown cause EXAM: MRI HEAD WITHOUT  CONTRAST TECHNIQUE: Multiplanar, multiecho pulse sequences of the brain and surrounding structures were obtained without intravenous contrast. COMPARISON:  MRI Oct 22, 2023. FINDINGS: Brain: Multiple acute infarcts in the right frontal, parietal and posterior temporal cortex as well as the adjacent subcortical white matter (right MCA territory). A few punctate acute infarcts in the left basal ganglia, thalamus and overlying white matter. No significant mass effect. Advanced chronic microvascular ischemic disease including T2/FLAIR hyperintensity in the white matter as well as remote left frontoparietal and bilateral occipital infarcts. Remote bilateral cerebellar infarcts. No evidence of acute hemorrhage, mass lesion, midline shift or hydrocephalus. Remote hemorrhage in the left occipital region associated with prior infarct. Vascular: Major arterial flow voids are maintained at the skull base. Skull and upper cervical spine: Normal marrow signal. Sinuses/Orbits: Clear sinuses.  No acute orbital findings. IMPRESSION: 1. Multiple acute infarcts in the right frontal, parietal and posterior temporal cortex as well as the adjacent subcortical white matter (potentially watershed or right MCA territory). 2. A few punctate acute infarcts in the left basal ganglia, thalamus and overlying white matter. 3. Advanced chronic microvascular ischemic disease, detailed above. Imaging results were communicated on 10/27/2023 at 11:34 am to provider Dr. Murvin Arthurs via telephone. Electronically Signed   By: Stevenson Elbe M.D.   On: 10/27/2023 11:37   Overnight EEG with video Result Date: 10/25/2023 Arleene Lack, MD     10/26/2023  9:28 AM Patient Name: BARDIA SARANTOS MRN: [id number] Epilepsy Attending: Arleene Lack Referring Physician/Provider: Khaliqdina, Salman, MD Duration: 10/24/2023 1141 to 10/26/2023 0500 Patient history: 43 y.o. male with hx of ESRD on hemodialysis, hypertension, prior stroke, history of seizures, lupus  anticoagulant on Eiquis was brought to the ER for seizure and altered mental status. EEG to evaluate for seizure. Level of alertness: Awake AEDs during EEG study: LEV Technical aspects: This EEG study was done with scalp electrodes positioned according to the 10-20 International system of electrode placement. Electrical activity was reviewed with band pass filter of 1-70Hz , sensitivity of 7 uV/mm, display speed of 4mm/sec with a 60Hz  notched filter applied as appropriate. EEG data were recorded continuously and digitally stored.  Video monitoring was available and reviewed as appropriate. Description: EEG showed continuous generalized high amplitude sharply contoured and at times rhythmic 3 to 6 Hz theta-delta slowing. Sharp waves were noted in left and right fronto-temporal region.  Hyperventilation and photic stimulation were not performed. One seizure without clinical signs was noted on 10/24/2023 at 1754. EEG showed rhythmic sharply contoured 4-5hz  theta slowing in right>left frontal region which then evolved into 2-3hz  spike and wave in right fronto-temporal region. This was followed by 5-6hz  high amplitude that slowing in right hemisphere which then also involved left hemisphere. Subsequently eeg evolved into 1-1.5hz  delta slowing. Duration of seizure was about 2 minutes. Parts of study was technically difficult due to significant electrode artifact. Between 10/24/2023 2200 to 10/25/2023 1428, EEG ws not interpretable due to electrode artifact. ABNORMALITY - Seizure without clinical signs, right frontal region - Sharp waves, left fronto-temporal region - Sharp waves, right fronto-temporal region - Continuous slow, generalized IMPRESSION: This technically difficult study showed one seizure without clinical signs on 10/24/2023 at 1754 arising from right frontal region, lasting about 2 minutes. Additionally there was independent epileptogenicity arising from left and right fronto-temporal region. Lastly there was  moderate diffuse encephalopathy. Arleene Lack   MR BRAIN WO CONTRAST Result Date: 10/22/2023 CLINICAL DATA:  Neuro deficit, acute, stroke suspected EXAM: MRI HEAD WITHOUT CONTRAST TECHNIQUE: Multiplanar, multiecho pulse sequences of the brain and surrounding structures were obtained without intravenous contrast. COMPARISON:  CT head May 4, 25. FINDINGS: Motion limited study.  Within this limitation: Brain: No acute infarction, hemorrhage, hydrocephalus, extra-axial collection or mass lesion. Remote bilateral cerebellar infarcts. Remote left frontoparietal and bilateral occipital infarcts. Advanced patchy and confluent T2/FLAIR hyperintensity in the white matter, compatible with chronic microvascular ischemic disease. Vascular: Major arterial flow voids are maintained at the skull base. Skull and upper cervical spine: Normal marrow signal. Sinuses/Orbits: Clear sinuses.  No acute orbital findings. IMPRESSION: 1. No evidence of acute intracranial abnormality. 2. Multiple remote infarcts and advanced chronic microvascular ischemic disease. Electronically Signed   By: Stevenson Elbe M.D.   On: 10/22/2023 19:57   Overnight EEG with video Result Date: 10/22/2023 Arleene Lack, MD     10/22/2023  9:52 AM Patient Name: EZECHIEL BENSING MRN: [id number] Epilepsy Attending: Arleene Lack Referring Physician/Provider: Arleene Lack, MD Duration: 10/21/2023 1032 to 10/22/2023 4098  Patient history:  43 y.o. male with history of ESRD on hemodialysis, hypertension, prior stroke, history of seizures, lupus anticoagulant was brought to the ER after patient had a seizure.  EEG to evaluate for seizure.  Level of alertness: Awake, asleep  AEDs during EEG study: None  Technical aspects: This EEG study was done with scalp electrodes positioned according to the 10-20 International system of electrode placement. Electrical activity was reviewed with band pass filter of 1-70Hz , sensitivity of 7 uV/mm, display speed of 5mm/sec  with a 60Hz  notched filter applied as appropriate. EEG data were recorded continuously and digitally stored.  Video monitoring was available and reviewed as appropriate.  Description: During awake state, no clear posterior dominant rhythm was seen.  Sleep was characterized by sleep spindles (13 to 15 Hz), maximal frontocentral region. EEG showed continuous generalized and at times rhythmic 3 to 6 Hz theta-delta slowing.   Hyperventilation and photic stimulation were not performed.    EEG was disconnected between 10/21/2023 1057 to 2329 for dialysis and imaging.  ABNORMALITY - Continuous slow, generalized  IMPRESSION: This study is suggestive of moderate diffuse encephalopathy. No seizures were noted.  Arleene Lack   EEG adult Result Date: 10/21/2023 Arleene Lack, MD     10/21/2023 11:47 AM Patient Name: EUAL GUIDICE MRN: [id number] Epilepsy Attending: Arleene Lack Referring Physician/Provider: [facility],  Tania Familia, MD Date:  10/21/2023 Duration: 23.18 mins Patient history:  43 y.o. male with history of ESRD on hemodialysis, hypertension, prior stroke, history of seizures, lupus anticoagulant was brought to the ER after patient had a seizure.  EEG to evaluate for seizure. Level of alertness: Awake, asleep AEDs during EEG study: None Technical aspects: This EEG study was done with scalp electrodes positioned according to the 10-20 International system of electrode placement. Electrical activity was reviewed with band pass filter of 1-70Hz , sensitivity of 7 uV/mm, display speed of 89mm/sec with a 60Hz  notched filter applied as appropriate. EEG data were recorded continuously and digitally stored.  Video monitoring was available and reviewed as appropriate. Description: During awake state, no clear posterior dominant rhythm was seen.  Sleep was characterized by sleep spindles (13 to 15 Hz), maximal frontocentral region.  EEG showed continuous generalized and at times rhythmic 3 to 6 Hz theta-delta slowing.   Sharp transients were noted in right frontal region. Hyperventilation and photic stimulation were not performed.   ABNORMALITY - Continuous slow, generalized IMPRESSION: This study is suggestive of moderate diffuse encephalopathy.  Sharp transients were noted in the right frontal region.  Because EEG showed rhythmic slowing as well as sharp transients, would recommend long-term EEG monitoring for further evaluation. Arleene Lack   CT Head Wo Contrast Result Date: 10/20/2023 CLINICAL DATA:  Seizure disorder, or altered mental status EXAM: CT HEAD WITHOUT CONTRAST TECHNIQUE: Contiguous axial images were obtained from the base of the skull through the vertex without intravenous contrast. RADIATION DOSE REDUCTION: This exam was performed according to the departmental dose-optimization program which includes automated exposure control, adjustment of the mA and/or kV according to patient size and/or use of iterative reconstruction technique. COMPARISON:  CT head 08/01/2023 FINDINGS: Brain: No intracranial hemorrhage, mass effect, or evidence of acute infarct. No hydrocephalus. No extra-axial fluid collection. Age-commensurate cerebral atrophy and chronic small vessel ischemic disease. Chronic left occipital, left insular, left parietal, left cerebellar, and right frontal infarcts. Vascular: No hyperdense vessel. Intracranial arterial calcification. Skull: No fracture or focal lesion. Sinuses/Orbits: No acute finding. Other: None. IMPRESSION: 1. No acute intracranial abnormality.  Multiple chronic infarcts. Electronically Signed   By: Rozell Cornet M.D.   On: 10/20/2023 20:24     PHYSICAL EXAM  Temp:  [97.8 F (36.6 C)-98.8 F (37.1 C)] 97.8 F (36.6 C) (05/12 1400) Pulse Rate:  [67-84] 76 (05/12 1400) Resp:  [11-23] 14 (05/12 1400) BP: (97-136)/(60-92) 119/80 (05/12 1400) SpO2:  [93 %-100 %] 97 % (05/12 1400) Weight:  [106.7 kg] 106.7 kg (05/12 1256)  General - Well nourished, well developed, in no  apparent distress.  Ophthalmologic - fundi not visualized due to noncooperation.  Cardiovascular - Regular rhythm and rate.  Neuro - awake, alert, eyes open but significant psychomotor slowing, orientated to place and self but told me age 54 instead of 10, told me year 2003 and not orientated to month. No aphasia but very limited speech output, with mild dysarthria, slow in response with questions or commands, but following all simple commands. Able to name 2/3 and repeat. No gaze palsy, tracking bilaterally, but poor vision with optic aprexia, seems to have right hemianopia. No facial droop. Tongue midline. LUE 4/5 and RUE 5/5, BLE 3/5. Sensation stated different bilaterally but can not tell which one abnormal or worsened sensation, b/l FTN no ataxia but with past pointing and optic aprexia, gait not tested.     ASSESSMENT/PLAN Mr. JONTHOMAS ANAGNOS is a 43  y.o. male with history of hypertension, ESRD on hemodialysis, seizure, antiphospholipid syndrome, stroke admitted for seizure and altered mental status.   Stroke:  right MCA patchy infarcts as well as left thalamic and caudate head punctate infarcts, embolic likely secondary to antiphospholipid syndrome with underdosed Eliquis  CT no acute abnormality MRI brain 10/22/2023 no acute infarct MRI brain 10/27/2023 multiple acute infarcts in the right frontal, parietal and temporal cortex as well as the adjacent subcortical white matter.  A few punctate acute infarct in the left BG, sinus and overlying bladder matter. MRA nondiagnostic, would not change management Carotid Doppler pending 2D Echo EF 65 to 70%, mitral valve calcified and restricted, possible rheumatic disease, moderate mitral stenosis, mild mitral regurgitation. LDL 75 HgbA1c 5.6 Eliquis  for VTE prophylaxis Not compliant with anticoagulation at home but on Eliquis  2.5 twice daily this admission until 10/27/2023, now on Eliquis  5 mg twice daily.  Ongoing aggressive stroke risk factor  management Therapy recommendations: Pending Disposition: Pending  History of stroke SLE with antiphospholipid syndrome 01/2023 admitted for left PCA infarct. MRI unremarkable. Carotid Doppler unremarkable. EF 55 to 60%. LDL 105, A1c 5.2. Discharged on Eliquis  and Lipitor 40.  03/2023 admitted for bilateral scattered punctate embolic shower.  CT showed old left parietal, PCA and cerebellar infarcts.  Discharged on Coumadin  with INR goal 2.5-3.5. Apparently patient was switched back to Eliquis  prior to current admission but only on 2.5 mg twice daily.  Seizure History of seizure Patient has history of complex partial seizure, known by neurology service Being seen several times in the past for seizure in clinic and hospital Home medication Keppra  500 twice daily. Questionable compliance Admitted 03/2023 for seizure and aspiration pneumonitis.  Received Ativan  and Keppra  load. EEG showed moderate to severe diffuse encephalopathy, no seizures or epileptiform discharges. Continued Keppra  This time also admitted for seizure and altered mental status.  LTM EEG showed right frontal subclinical seizure.  Added Depakote to Keppra  Continue Keppra  500 twice daily Depakote level today 52, increase Depakote 500 Q8 ->1000 Q12 Check Depakote level in a couple days  Hypertension Stable but soft Long term BP goal normotensive  Hyperlipidemia Home meds: Lipitor 40 LDL 75, goal < 70 Now on Lipitor 40 Continue statin at discharge  Other Stroke Risk Factors ESRD on hemodialysis Obesity, Body mass index is 31.9 kg/m.   Other Active Problems Medication noncompliance Thrombocytopenia, platelet 126--106--104  Hospital day # 7  Patient medical conditions complicated.  I spent extensive face-to-face time with the patient, more than 50% of which was spent in counseling and coordination of care, reviewing test results, images and medication, and discussing the diagnosis, treatment plan and potential  prognosis. This patient's care requiresreview of multiple databases, neurological assessment, discussion with family, other specialists and medical decision making of high complexity.  Consuelo Denmark, MD PhD Stroke Neurology 10/28/2023 3:04 PM    To contact Stroke Continuity provider, please refer to [email or web address]. After hours, contact General Neurology

## 2023-10-28 NOTE — TOC Progression Note (Signed)
 Transition of Care [facility]) - Progression Note    Patient Details  Name: [name] [name] MRN: [id number] Date of Birth: [birth year]  [facility] CM/SW Contact  Jonathan Neighbor, RN Phone Number: 10/28/2023, 3:23 PM  Clinical Narrative:     Awaiting therapy evals. TOC following.  Expected Discharge Plan: Home/Self Care Barriers to Discharge: Continued Medical Work up  Expected Discharge Plan and Services                                               Social Determinants of Health (SDOH) Interventions SDOH Screenings   Food Insecurity: No Food Insecurity (10/21/2023)  Housing: High Risk (10/21/2023)  Transportation Needs: No Transportation Needs (10/21/2023)  Utilities: Not At Risk (10/21/2023)  Alcohol Screen: Low Risk  (03/26/2023)  Depression (PHQ2-9): Low Risk  (03/26/2023)  Financial Resource Strain: Low Risk  (03/26/2023)  Physical Activity: Inactive (03/26/2023)  Social Connections: Moderately Isolated (10/21/2023)  Stress: No Stress Concern Present (03/26/2023)  Tobacco Use: High Risk (10/21/2023)  Health Literacy: Adequate Health Literacy (03/26/2023)    Readmission Risk Interventions    03/28/2023    2:57 PM 01/25/2023    3:40 PM  Readmission Risk Prevention Plan  Post Dischage Appt    Medication Screening    Transportation Screening Complete   Medication Review (RN Care Manager) Referral to Pharmacy   PCP or Specialist appointment within 3-5 days of discharge Complete   HRI or Home Care Consult Complete   SW Recovery Care/Counseling Consult Complete   Palliative Care Screening Not Applicable   Skilled Nursing Facility Not Applicable      Information is confidential and restricted. Go to Review Flowsheets to unlock data.

## 2023-10-28 NOTE — Progress Notes (Signed)
   10/28/23 1500  Spiritual Encounters  Type of Visit Initial  Care provided to: Patient  Referral source Family  Reason for visit Routine spiritual support  OnCall Visit No   Chaplain responded to consult request for support. Patient was very confused. He asked me couple of times "Where am I at? What is this place?" Chaplain responded compassionately and provided some support. Chaplain will be available if needed.  M.Kubra Welby Hale Resident [phone]

## 2023-10-29 ENCOUNTER — Inpatient Hospital Stay (HOSPITAL_COMMUNITY)

## 2023-10-29 DIAGNOSIS — D6861 Antiphospholipid syndrome: Secondary | ICD-10-CM | POA: Diagnosis not present

## 2023-10-29 DIAGNOSIS — I639 Cerebral infarction, unspecified: Secondary | ICD-10-CM | POA: Diagnosis not present

## 2023-10-29 DIAGNOSIS — D696 Thrombocytopenia, unspecified: Secondary | ICD-10-CM

## 2023-10-29 DIAGNOSIS — I6389 Other cerebral infarction: Secondary | ICD-10-CM

## 2023-10-29 DIAGNOSIS — T45516A Underdosing of anticoagulants, initial encounter: Secondary | ICD-10-CM | POA: Diagnosis not present

## 2023-10-29 DIAGNOSIS — I63511 Cerebral infarction due to unspecified occlusion or stenosis of right middle cerebral artery: Secondary | ICD-10-CM | POA: Diagnosis not present

## 2023-10-29 DIAGNOSIS — G9340 Encephalopathy, unspecified: Secondary | ICD-10-CM | POA: Diagnosis not present

## 2023-10-29 LAB — BASIC METABOLIC PANEL WITH GFR
Anion gap: 18 — ABNORMAL HIGH (ref 5–15)
BUN: 50 mg/dL — ABNORMAL HIGH (ref 6–20)
CO2: 26 mmol/L (ref 22–32)
Calcium: 9 mg/dL (ref 8.9–10.3)
Chloride: 92 mmol/L — ABNORMAL LOW (ref 98–111)
Creatinine, Ser: 15.6 mg/dL — ABNORMAL HIGH (ref 0.61–1.24)
GFR, Estimated: 4 mL/min — ABNORMAL LOW (ref >60)
Glucose, Bld: 92 mg/dL (ref 70–99)
Potassium: 4 mmol/L (ref 3.5–5.1)
Sodium: 136 mmol/L (ref 135–145)

## 2023-10-29 NOTE — Progress Notes (Signed)
  [facility] Progress Note   Subjective:    Seen in room Working w/ PT/OT, plan is for SNF   Objective Vitals:   10/29/23 0022 10/29/23 0749 10/29/23 1118 10/29/23 1503  BP: (!) 152/94 134/80 135/64 (!) 152/89  Pulse: 89 68 69 67  Resp: 20 20 18 18   Temp: 97.9 F (36.6 C) 98.1 F (36.7 C) 98.4 F (36.9 C) 98 F (36.7 C)  TempSrc: Axillary Oral Oral Oral  SpO2: 97% 100% 92% 100%  Weight:      Height:       Physical Exam General: alert, responsive today, much better Heart:S1 and S2; No murmurs, gallops, and rubs Lungs: Clear anteriorly Abdomen: Soft and non-tender Extremities: no LE edema Dialysis Access: R TDC    Dialysis Orders: MWF [facility] 4h   B400   110.5kg  RIJ TDC   Heparin  none Mircera: none Calcitriol  2.5mcg PO qHD - last dose 10/19/23 Sensipar  150mg  with HD - last dose 10/19/23  Assessment/Plan: Seizure disorder - Neurology following; EEG suggestive of moderate diffuse encephalopathy, no seizures noted and MRI brain no acute changes. Had seizures on HD 5/09. Is on keppra , depakote and vimpat now. Per neuro/ pmd.  Acute encephalopathy - doing better, still some deficits per neurology.  ESRD -  on HD MWF. HD tomorrow.  HTN/ volume  - bp's dropped so norvasc  and coreg  were dc'd. Still 4-5kg under, min UF goal.  Anemia of CKD - Current Hgb at goal. No Fe/ESA is indicated at this time. Secondary hyperparathyroidism -  cont po vdra and sensipar  and binder Nutrition - Renal diet with fluid restriction  Larry Poag  MD  [facility] 10/29/2023, 5:44 PM  Recent Labs  Lab 10/23/23 0830 10/23/23 2032 10/25/23 0646 10/26/23 0732 10/27/23 0711 10/28/23 0709 10/29/23 0615  HGB 11.5*  --  13.1 13.7 13.0 12.7*  --   ALBUMIN  3.3*  --   --   --   --   --   --   CALCIUM  9.6   < > 9.6 10.3 9.7 10.0 9.0  PHOS 7.6*  --  7.5* 7.8*  --   --   --   CREATININE 17.76*   < > 17.39* 13.86* 18.16* 20.54* 15.60*  K 4.1   < > 4.1 4.6 4.3 4.3 4.0   < > = values in this interval  not displayed.    Inpatient medications:  apixaban   5 mg Oral BID   atorvastatin   40 mg Oral Daily   calcitRIOL   2.5 mcg Oral Q M,W,F-HD   cinacalcet   150 mg Oral Q M,W,F-HD   divalproex  1,000 mg Oral Q12H   feeding supplement (NEPRO CARB STEADY)  237 mL Oral BID BM   levETIRAcetam   500 mg Oral BID   levETIRAcetam   500 mg Oral Q M,W,F-HD   multivitamin  1 tablet Oral QHS   sevelamer  carbonate  3,200 mg Oral TID WC   sodium chloride  flush  10-40 mL Intracatheter Q12H     alteplase , heparin , hydrALAZINE , lidocaine  (PF), lidocaine -prilocaine , LORazepam , pentafluoroprop-tetrafluoroeth, sodium chloride  flush, trimethobenzamide

## 2023-10-29 NOTE — Progress Notes (Signed)
 STROKE TEAM PROGRESS NOTE   SUBJECTIVE (INTERVAL HISTORY) No family is at the bedside.  Pt reclining in bed for lunch. He is awake alert but still has psychomotor slowing, not fully orientated but stated that he live alone and not sure if somebody can help him with medication. Of note, PT and OT recommend SNF.    OBJECTIVE Temp:  [97.9 F (36.6 C)-98.4 F (36.9 C)] 98 F (36.7 C) (05/13 1503) Pulse Rate:  [67-89] 67 (05/13 1503) Cardiac Rhythm: Normal sinus rhythm (05/13 0832) Resp:  [18-20] 18 (05/13 1503) BP: (134-152)/(64-94) 152/89 (05/13 1503) SpO2:  [92 %-100 %] 100 % (05/13 1503)  No results for input(s): "GLUCAP" in the last 168 hours.  Recent Labs  Lab 10/23/23 0830 10/23/23 2032 10/25/23 0646 10/26/23 0732 10/27/23 0711 10/28/23 0709 10/29/23 0615  NA 139   < > 136 138 137 138 136  K 4.1   < > 4.1 4.6 4.3 4.3 4.0  CL 99   < > 95* 97* 95* 92* 92*  CO2 26   < > 22 21* 24 22 26   GLUCOSE 93   < > 108* 84 95 77 92  BUN 46*   < > 55* 42* 62* 82* 50*  CREATININE 17.76*   < > 17.39* 13.86* 18.16* 20.54* 15.60*  CALCIUM  9.6   < > 9.6 10.3 9.7 10.0 9.0  MG  --   --   --  2.4  --   --   --   PHOS 7.6*  --  7.5* 7.8*  --   --   --    < > = values in this interval not displayed.   Recent Labs  Lab 10/23/23 0830  ALBUMIN  3.3*   Recent Labs  Lab 10/23/23 0830 10/25/23 0646 10/26/23 0732 10/27/23 0711 10/28/23 0709  WBC 8.2 10.8* 9.8 7.7 7.4  NEUTROABS  --  7.7  --   --   --   HGB 11.5* 13.1 13.7 13.0 12.7*  HCT 35.2* 38.2* 40.7 39.1 37.8*  MCV 92.9 91.2 90.8 92.7 92.2  PLT 144* 126* 106* 104* 95*   No results for input(s): "CKTOTAL", "CKMB", "CKMBINDEX", "TROPONINI" in the last 168 hours. No results for input(s): "LABPROT", "INR" in the last 72 hours. No results for input(s): "COLORURINE", "LABSPEC", "PHURINE", "GLUCOSEU", "HGBUR", "BILIRUBINUR", "KETONESUR", "PROTEINUR", "UROBILINOGEN", "NITRITE", "LEUKOCYTESUR" in the last 72 hours.  Invalid input(s):  "APPERANCEUR"     Component Value Date/Time   CHOL 140 10/28/2023 0709   CHOL 194 09/27/2020 1140   TRIG 193 (H) 10/28/2023 0709   HDL 26 (L) 10/28/2023 0709   HDL 56 09/27/2020 1140   CHOLHDL 5.4 10/28/2023 0709   VLDL 39 10/28/2023 0709   LDLCALC 75 10/28/2023 0709   LDLCALC 114 (H) 09/27/2020 1140   Lab Results  Component Value Date   HGBA1C 5.6 10/27/2023      Component Value Date/Time   LABOPIA NONE DETECTED 02/15/2009 1435   COCAINSCRNUR NONE DETECTED 02/15/2009 1435   LABBENZ NONE DETECTED 02/15/2009 1435   AMPHETMU NONE DETECTED 02/15/2009 1435   THCU NONE DETECTED 02/15/2009 1435   LABBARB  02/15/2009 1435    NONE DETECTED        DRUG SCREEN FOR MEDICAL PURPOSES ONLY.  IF CONFIRMATION IS NEEDED FOR ANY PURPOSE, NOTIFY LAB WITHIN 5 DAYS.        LOWEST DETECTABLE LIMITS FOR URINE DRUG SCREEN Drug Class       Cutoff (ng/mL) Amphetamine  1000 Barbiturate      200 Benzodiazepine   200 Tricyclics       300 Opiates          300 Cocaine          300 THC              50    No results for input(s): "ETH" in the last 168 hours.  I have personally reviewed the radiological images below and agree with the radiology interpretations.  VAS US  CAROTID Result Date: 10/29/2023 Carotid Arterial Duplex Study Patient Name:  [name] [name]  Date of Exam:   10/29/2023 Medical Rec #: [id number]       Accession #:    [id number] Date of Birth: October 02, 1980      Patient Gender: M Patient Age:   43 years Exam Location:  [facility] Procedure:      VAS US  CAROTID Referring Phys: Jeanella Milan [facility] --------------------------------------------------------------------------------  Indications:       CVA. Risk Factors:      Hypertension. Comparison Study:  01/28/2023 - Right Carotid: Velocities in the right ICA are                    consistent with a 1-39% stenosis.                     Left Carotid: Velocities in the left ICA are consistent with                    a 1-39%                     stenosis.                     Vertebrals: Bilateral vertebral arteries demonstrate                    antegrade flow.                    Subclavians: Normal flow hemodynamics were seen in bilateral                    subclavian arteries. Performing Technologist: Lerry Ransom RVT  Examination Guidelines: A complete evaluation includes B-mode imaging, spectral Doppler, color Doppler, and power Doppler as needed of all accessible portions of each vessel. Bilateral testing is considered an integral part of a complete examination. Limited examinations for reoccurring indications may be performed as noted.  Right Carotid Findings: +----------+--------+--------+--------+-----------------------+--------+           PSV cm/sEDV cm/sStenosisPlaque Description     Comments +----------+--------+--------+--------+-----------------------+--------+ CCA Prox  57      15              smooth and heterogenous         +----------+--------+--------+--------+-----------------------+--------+ CCA Distal69      20              smooth and heterogenous         +----------+--------+--------+--------+-----------------------+--------+ ICA Prox  75      16                                     tortuous +----------+--------+--------+--------+-----------------------+--------+ ICA Mid   57      20  tortuous +----------+--------+--------+--------+-----------------------+--------+ ICA Distal53      23                                     tortuous +----------+--------+--------+--------+-----------------------+--------+ ECA       53      12                                              +----------+--------+--------+--------+-----------------------+--------+ +----------+--------+-------+--------+-------------------+           PSV cm/sEDV cmsDescribeArm Pressure (mmHG) +----------+--------+-------+--------+-------------------+ [id number]                                          +----------+--------+-------+--------+-------------------+ +---------+--------+--+--------+--+---------+ VertebralPSV cm/s46EDV cm/s20Antegrade +---------+--------+--+--------+--+---------+  Left Carotid Findings: +----------+--------+--------+--------+-----------------------+--------+           PSV cm/sEDV cm/sStenosisPlaque Description     Comments +----------+--------+--------+--------+-----------------------+--------+ CCA Prox  83      19              smooth and heterogenous         +----------+--------+--------+--------+-----------------------+--------+ CCA Distal77      18              smooth and heterogenous         +----------+--------+--------+--------+-----------------------+--------+ ICA Prox  73      35              smooth and heterogenous         +----------+--------+--------+--------+-----------------------+--------+ ICA Mid   58      21                                     tortuous +----------+--------+--------+--------+-----------------------+--------+ ICA Distal65      23                                     tortuous +----------+--------+--------+--------+-----------------------+--------+ ECA       47      10                                              +----------+--------+--------+--------+-----------------------+--------+ +----------+--------+--------+--------+-------------------+           PSV cm/sEDV cm/sDescribeArm Pressure (mmHG) +----------+--------+--------+--------+-------------------+ Subclavian102                                         +----------+--------+--------+--------+-------------------+ +---------+--------+--+--------+--+---------+ VertebralPSV cm/s45EDV cm/s12Antegrade +---------+--------+--+--------+--+---------+   Summary: Right Carotid: Velocities in the right ICA are consistent with a 1-39% stenosis. Left Carotid: Velocities in the left ICA are consistent with a 1-39% stenosis.  Vertebrals: Bilateral vertebral arteries demonstrate antegrade flow. *See table(s) above for measurements and observations.     Preliminary    ECHOCARDIOGRAM COMPLETE BUBBLE STUDY Result Date: 10/28/2023    ECHOCARDIOGRAM REPORT   Patient Name:   [name] [name] Date of Exam: 10/28/2023 Medical Rec #:  [id number]      Height:       72.0 in Accession #:    [id number]     Weight:       235.2 lb Date of Birth:  [birth year]     BSA:          2.283 m Patient Age:    42 years       BP:           131/90 mmHg Patient Gender: M              HR:           78 bpm. Exam Location:  Inpatient Procedure: 2D Echo (Both Spectral and Color Flow Doppler were utilized during            procedure). Indications:    stroke  History:        Patient has prior history of Echocardiogram examinations, most                 recent 01/19/2023. End stage renal disease. Lupus.; Risk                 Factors:Current Smoker and Hypertension.  Sonographer:    Dione Franks RDCS Referring Phys: [id number] Jilda Most MDALA-GAUSI IMPRESSIONS  1. Left ventricular ejection fraction, by estimation, is 65 to 70%. The left ventricle has normal function. The left ventricle has no regional wall motion abnormalities. There is moderate concentric left ventricular hypertrophy. Left ventricular diastolic parameters are consistent with Grade I diastolic dysfunction (impaired relaxation).  2. Right ventricular systolic function is normal. The right ventricular size is normal. Tricuspid regurgitation signal is inadequate for assessing PA pressure.  3. Left atrial size was mildly dilated.  4. The mitral valve is calcified and restricted, possible rheumatic disease. Mild mitral valve regurgitation. Moderate mitral stenosis with mean gradient 5 mmHg, MVA 1.28 cm^2 by VTI and 1.38 cm^2 by PHT.  5. The aortic valve is tricuspid. There is mild calcification of the aortic valve. Aortic valve regurgitation is not visualized. No aortic stenosis is present.  6. The inferior  vena cava is normal in size with greater than 50% respiratory variability, suggesting right atrial pressure of 3 mmHg.  7. Bubble study was unsuccessful. FINDINGS  Left Ventricle: Left ventricular ejection fraction, by estimation, is 65 to 70%. The left ventricle has normal function. The left ventricle has no regional wall motion abnormalities. The left ventricular internal cavity size was normal in size. There is  moderate concentric left ventricular hypertrophy. Left ventricular diastolic parameters are consistent with Grade I diastolic dysfunction (impaired relaxation). Right Ventricle: The right ventricular size is normal. No increase in right ventricular wall thickness. Right ventricular systolic function is normal. Tricuspid regurgitation signal is inadequate for assessing PA pressure. Left Atrium: Left atrial size was mildly dilated. Right Atrium: Right atrial size was normal in size. Pericardium: There is no evidence of pericardial effusion. Mitral Valve: The mitral valve is rheumatic. There is moderate calcification of the mitral valve leaflet(s). Moderately decreased mobility of the mitral valve leaflets. Mild to moderate mitral annular calcification. Mild mitral valve regurgitation. Moderate mitral valve stenosis. MV peak gradient, 12.3 mmHg. The mean mitral valve gradient is 5.0 mmHg. Tricuspid Valve: The tricuspid valve is normal in structure. Tricuspid valve regurgitation is not demonstrated. Aortic Valve: The aortic valve is tricuspid. There is mild calcification of the aortic valve. Aortic valve regurgitation is not visualized. No aortic stenosis is present. Pulmonic Valve: The pulmonic valve was  normal in structure. Pulmonic valve regurgitation is not visualized. Aorta: The aortic root is normal in size and structure. Venous: The inferior vena cava is normal in size with greater than 50% respiratory variability, suggesting right atrial pressure of 3 mmHg. IAS/Shunts: Bubble study was unsuccessful.  Agitated saline contrast was given intravenously to evaluate for intracardiac shunting.  LEFT VENTRICLE PLAX 2D LVIDd:         3.50 cm LVIDs:         2.50 cm LV PW:         1.40 cm LV IVS:        1.10 cm LVOT diam:     2.20 cm LV SV:         61 LV SV Index:   27 LVOT Area:     3.80 cm  RIGHT VENTRICLE             IVC RV Basal diam:  2.70 cm     IVC diam: 1.30 cm RV S prime:     12.80 cm/s TAPSE (M-mode): 0.8 cm LEFT ATRIUM             Index        RIGHT ATRIUM           Index LA diam:        4.10 cm 1.80 cm/m   RA Area:     14.10 cm LA Vol (A2C):   69.8 ml 30.58 ml/m  RA Volume:   30.20 ml  13.23 ml/m LA Vol (A4C):   73.1 ml 32.02 ml/m LA Biplane Vol: 75.7 ml 33.16 ml/m  AORTIC VALVE LVOT Vmax:   129.00 cm/s LVOT Vmean:  83.600 cm/s LVOT VTI:    0.160 m  AORTA Ao Root diam: 3.50 cm Ao Asc diam:  3.40 cm MITRAL VALVE MV Area (PHT): 1.38 cm   SHUNTS MV Area VTI:   1.39 cm   Systemic VTI:  0.16 m MV Peak grad:  12.3 mmHg  Systemic Diam: 2.20 cm MV Mean grad:  5.0 mmHg MV Vmax:       1.76 m/s MV Vmean:      95.6 cm/s [name] McleanMD Electronically signed by Archer Bear Signature Date/Time: 10/28/2023/6:30:21 PM    Final    MR ANGIO HEAD WO CONTRAST Result Date: 10/27/2023 CLINICAL DATA:  Stroke suspected EXAM: MRA HEAD WITHOUT CONTRAST TECHNIQUE: Angiographic images of the Circle of Willis were acquired using MRA technique without intravenous contrast. COMPARISON:  Brain MRI from earlier today FINDINGS: Severe motion artifact, nondiagnostic except showing patency of the carotid, vertebral, basilar, and major proximal branches. Cannot assess for stenosis or aneurysm. IMPRESSION: Severe motion artifact, non diagnostic other than documenting patency of the major intracranial arteries. Electronically Signed   By: Ronnette Coke M.D.   On: 10/27/2023 12:54   MR BRAIN WO CONTRAST Result Date: 10/27/2023 CLINICAL DATA:  Mental status change, unknown cause EXAM: MRI HEAD WITHOUT CONTRAST TECHNIQUE:  Multiplanar, multiecho pulse sequences of the brain and surrounding structures were obtained without intravenous contrast. COMPARISON:  MRI Oct 22, 2023. FINDINGS: Brain: Multiple acute infarcts in the right frontal, parietal and posterior temporal cortex as well as the adjacent subcortical white matter (right MCA territory). A few punctate acute infarcts in the left basal ganglia, thalamus and overlying white matter. No significant mass effect. Advanced chronic microvascular ischemic disease including T2/FLAIR hyperintensity in the white matter as well as remote left frontoparietal and bilateral occipital infarcts. Remote bilateral cerebellar infarcts. No evidence of  acute hemorrhage, mass lesion, midline shift or hydrocephalus. Remote hemorrhage in the left occipital region associated with prior infarct. Vascular: Major arterial flow voids are maintained at the skull base. Skull and upper cervical spine: Normal marrow signal. Sinuses/Orbits: Clear sinuses.  No acute orbital findings. IMPRESSION: 1. Multiple acute infarcts in the right frontal, parietal and posterior temporal cortex as well as the adjacent subcortical white matter (potentially watershed or right MCA territory). 2. A few punctate acute infarcts in the left basal ganglia, thalamus and overlying white matter. 3. Advanced chronic microvascular ischemic disease, detailed above. Imaging results were communicated on 10/27/2023 at 11:34 am to provider Dr. Murvin Arthurs via telephone. Electronically Signed   By: Stevenson Elbe M.D.   On: 10/27/2023 11:37   Overnight EEG with video Result Date: 10/25/2023 Arleene Lack, MD     10/26/2023  9:28 AM Patient Name: BOSTIN VITIELLO MRN: [id number] Epilepsy Attending: Arleene Lack Referring Physician/Provider: Khaliqdina, Salman, MD Duration: 10/24/2023 1141 to 10/26/2023 0500 Patient history: 43 y.o. male with hx of ESRD on hemodialysis, hypertension, prior stroke, history of seizures, lupus anticoagulant on  [place] was brought to the ER for seizure and altered mental status. EEG to evaluate for seizure. Level of alertness: Awake AEDs during EEG study: LEV Technical aspects: This EEG study was done with scalp electrodes positioned according to the 10-20 International system of electrode placement. Electrical activity was reviewed with band pass filter of 1-70Hz , sensitivity of 7 uV/mm, display speed of 36mm/sec with a 60Hz  notched filter applied as appropriate. EEG data were recorded continuously and digitally stored.  Video monitoring was available and reviewed as appropriate. Description: EEG showed continuous generalized high amplitude sharply contoured and at times rhythmic 3 to 6 Hz theta-delta slowing. Sharp waves were noted in left and right fronto-temporal region. Hyperventilation and photic stimulation were not performed. One seizure without clinical signs was noted on 10/24/2023 at 1754. EEG showed rhythmic sharply contoured 4-5hz  theta slowing in right>left frontal region which then evolved into 2-3hz  spike and wave in right fronto-temporal region. This was followed by 5-6hz  high amplitude that slowing in right hemisphere which then also involved left hemisphere. Subsequently eeg evolved into 1-1.5hz  delta slowing. Duration of seizure was about 2 minutes. Parts of study was technically difficult due to significant electrode artifact. Between 10/24/2023 2200 to 10/25/2023 1428, EEG ws not interpretable due to electrode artifact. ABNORMALITY - Seizure without clinical signs, right frontal region - Sharp waves, left fronto-temporal region - Sharp waves, right fronto-temporal region - Continuous slow, generalized IMPRESSION: This technically difficult study showed one seizure without clinical signs on 10/24/2023 at 1754 arising from right frontal region, lasting about 2 minutes. Additionally there was independent epileptogenicity arising from left and right fronto-temporal region. Lastly there was moderate diffuse  encephalopathy. Arleene Lack   MR BRAIN WO CONTRAST Result Date: 10/22/2023 CLINICAL DATA:  Neuro deficit, acute, stroke suspected EXAM: MRI HEAD WITHOUT CONTRAST TECHNIQUE: Multiplanar, multiecho pulse sequences of the brain and surrounding structures were obtained without intravenous contrast. COMPARISON:  CT head May 4, 25. FINDINGS: Motion limited study.  Within this limitation: Brain: No acute infarction, hemorrhage, hydrocephalus, extra-axial collection or mass lesion. Remote bilateral cerebellar infarcts. Remote left frontoparietal and bilateral occipital infarcts. Advanced patchy and confluent T2/FLAIR hyperintensity in the white matter, compatible with chronic microvascular ischemic disease. Vascular: Major arterial flow voids are maintained at the skull base. Skull and upper cervical spine: Normal marrow signal. Sinuses/Orbits: Clear sinuses.  No acute orbital findings. IMPRESSION: 1.  No evidence of acute intracranial abnormality. 2. Multiple remote infarcts and advanced chronic microvascular ischemic disease. Electronically Signed   By: Stevenson Elbe M.D.   On: 10/22/2023 19:57   Overnight EEG with video Result Date: 10/22/2023 Arleene Lack, MD     10/22/2023  9:52 AM Patient Name: NASAI DIVEN MRN: [id number] Epilepsy Attending: Arleene Lack Referring Physician/Provider: Arleene Lack, MD Duration: 10/21/2023 1032 to 10/22/2023 4098  Patient history:  43 y.o. male with history of ESRD on hemodialysis, hypertension, prior stroke, history of seizures, lupus anticoagulant was brought to the ER after patient had a seizure.  EEG to evaluate for seizure.  Level of alertness: Awake, asleep  AEDs during EEG study: None  Technical aspects: This EEG study was done with scalp electrodes positioned according to the 10-20 International system of electrode placement. Electrical activity was reviewed with band pass filter of 1-70Hz , sensitivity of 7 uV/mm, display speed of 53mm/sec with a 60Hz   notched filter applied as appropriate. EEG data were recorded continuously and digitally stored.  Video monitoring was available and reviewed as appropriate.  Description: During awake state, no clear posterior dominant rhythm was seen.  Sleep was characterized by sleep spindles (13 to 15 Hz), maximal frontocentral region. EEG showed continuous generalized and at times rhythmic 3 to 6 Hz theta-delta slowing.   Hyperventilation and photic stimulation were not performed.    EEG was disconnected between 10/21/2023 1057 to 2329 for dialysis and imaging.  ABNORMALITY - Continuous slow, generalized  IMPRESSION: This study is suggestive of moderate diffuse encephalopathy. No seizures were noted.  Arleene Lack   EEG adult Result Date: 10/21/2023 Arleene Lack, MD     10/21/2023 11:47 AM Patient Name: TICE MCMEANS MRN: [id number] Epilepsy Attending: Arleene Lack Referring Physician/Provider: Angelene Kelly, MD Date:  10/21/2023 Duration: 23.18 mins Patient history:  43 y.o. male with history of ESRD on hemodialysis, hypertension, prior stroke, history of seizures, lupus anticoagulant was brought to the ER after patient had a seizure.  EEG to evaluate for seizure. Level of alertness: Awake, asleep AEDs during EEG study: None Technical aspects: This EEG study was done with scalp electrodes positioned according to the 10-20 International system of electrode placement. Electrical activity was reviewed with band pass filter of 1-70Hz , sensitivity of 7 uV/mm, display speed of 40mm/sec with a 60Hz  notched filter applied as appropriate. EEG data were recorded continuously and digitally stored.  Video monitoring was available and reviewed as appropriate. Description: During awake state, no clear posterior dominant rhythm was seen.  Sleep was characterized by sleep spindles (13 to 15 Hz), maximal frontocentral region.  EEG showed continuous generalized and at times rhythmic 3 to 6 Hz theta-delta slowing.  Sharp  transients were noted in right frontal region. Hyperventilation and photic stimulation were not performed.   ABNORMALITY - Continuous slow, generalized IMPRESSION: This study is suggestive of moderate diffuse encephalopathy.  Sharp transients were noted in the right frontal region.  Because EEG showed rhythmic slowing as well as sharp transients, would recommend long-term EEG monitoring for further evaluation. Arleene Lack   CT Head Wo Contrast Result Date: 10/20/2023 CLINICAL DATA:  Seizure disorder, or altered mental status EXAM: CT HEAD WITHOUT CONTRAST TECHNIQUE: Contiguous axial images were obtained from the base of the skull through the vertex without intravenous contrast. RADIATION DOSE REDUCTION: This exam was performed according to the departmental dose-optimization program which includes automated exposure control, adjustment of the mA and/or kV according to  patient size and/or use of iterative reconstruction technique. COMPARISON:  CT head 08/01/2023 FINDINGS: Brain: No intracranial hemorrhage, mass effect, or evidence of acute infarct. No hydrocephalus. No extra-axial fluid collection. Age-commensurate cerebral atrophy and chronic small vessel ischemic disease. Chronic left occipital, left insular, left parietal, left cerebellar, and right frontal infarcts. Vascular: No hyperdense vessel. Intracranial arterial calcification. Skull: No fracture or focal lesion. Sinuses/Orbits: No acute finding. Other: None. IMPRESSION: 1. No acute intracranial abnormality.  Multiple chronic infarcts. Electronically Signed   By: Rozell Cornet M.D.   On: 10/20/2023 20:24     PHYSICAL EXAM  Temp:  [97.9 F (36.6 C)-98.4 F (36.9 C)] 98 F (36.7 C) (05/13 1503) Pulse Rate:  [67-89] 67 (05/13 1503) Resp:  [18-20] 18 (05/13 1503) BP: (134-152)/(64-94) 152/89 (05/13 1503) SpO2:  [92 %-100 %] 100 % (05/13 1503)  General - Well nourished, well developed, in no apparent distress.  Ophthalmologic - fundi not  visualized due to noncooperation.  Cardiovascular - Regular rhythm and rate.  Neuro - awake, alert, eyes open but psychomotor slowing, orientated to place and self and age, but not to time. No aphasia but very limited speech output, with mild dysarthria, slow in response with questions or commands, but following all simple commands. Able to name 2/3 and repeat. No gaze palsy, tracking bilaterally, but poor vision with optic aprexia, seems to have right hemianopia. No facial droop. Tongue midline. LUE 4/5 and RUE 5/5, BLE 3/5. Sensation stated different bilaterally but can not tell which one abnormal or worsened sensation, b/l FTN no ataxia but with past pointing and optic aprexia, gait not tested.     ASSESSMENT/PLAN Mr. SINCERE GAYE is a 43 y.o. male with history of hypertension, ESRD on hemodialysis, seizure, antiphospholipid syndrome, stroke admitted for seizure and altered mental status.   Stroke:  right MCA patchy infarcts as well as left thalamic and caudate head punctate infarcts, embolic likely secondary to antiphospholipid syndrome with underdosed Eliquis  CT no acute abnormality MRI brain 10/22/2023 no acute infarct MRI brain 10/27/2023 multiple acute infarcts in the right frontal, parietal and temporal cortex as well as the adjacent subcortical white matter.  A few punctate acute infarct in the left BG, sinus and overlying bladder matter. MRA nondiagnostic, would not change management Carotid Doppler unremarkable 2D Echo EF 65 to 70%, mitral valve calcified and restricted, possible rheumatic disease, moderate mitral stenosis, mild mitral regurgitation. LDL 75 HgbA1c 5.6 Eliquis  for VTE prophylaxis Not compliant with anticoagulation at home but on Eliquis  2.5 twice daily this admission until 10/27/2023, now on Eliquis  5 mg twice daily.  Ongoing aggressive stroke risk factor management Therapy recommendations: SNF Disposition: Pending  History of stroke SLE with antiphospholipid  syndrome 01/2023 admitted for left PCA infarct. MRI unremarkable. Carotid Doppler unremarkable. EF 55 to 60%. LDL 105, A1c 5.2. Discharged on Eliquis  and Lipitor 40.  03/2023 admitted for bilateral scattered punctate embolic shower.  CT showed old left parietal, PCA and cerebellar infarcts.  Discharged on Coumadin  with INR goal 2.5-3.5. Apparently patient was switched back to Eliquis  prior to current admission but only on 2.5 mg twice daily.  Seizure History of seizure Patient has history of complex partial seizure, known by neurology service Being seen several times in the past for seizure in clinic and hospital Home medication Keppra  500 twice daily. Questionable compliance Admitted 03/2023 for seizure and aspiration pneumonitis.  Received Ativan  and Keppra  load. EEG showed moderate to severe diffuse encephalopathy, no seizures or epileptiform discharges. Continued Keppra  This  time also admitted for seizure and altered mental status.  LTM EEG showed right frontal subclinical seizure.  Added Depakote to Keppra  Continue Keppra  500 twice daily Depakote level today 52-> pending increase Depakote 500 Q8 ->1000 Q12  Hypertension Stable but soft Long term BP goal normotensive  Hyperlipidemia Home meds: Lipitor 40 LDL 75, goal < 70 Now on Lipitor 40 Continue statin at discharge  Other Stroke Risk Factors ESRD on hemodialysis Obesity, Body mass index is 31.9 kg/m.   Other Active Problems Medication noncompliance Thrombocytopenia, platelet 126--106--104--95  Hospital day # 8   Consuelo Denmark, MD PhD Stroke Neurology 10/29/2023 4:46 PM    To contact Stroke Continuity provider, please refer to [email or web address]. After hours, contact General Neurology

## 2023-10-29 NOTE — NC FL2 (Signed)
 [place]  MEDICAID FL2 LEVEL OF CARE FORM     IDENTIFICATION  Patient Name: [name] [name] Birthdate: 09/20/1980 Sex: male Admission Date (Current Location): 10/20/2023  [facility] and [place] Number:  Producer, television/film/video and Address:  [facility], [address] [place], [place] [address]      Provider Number: [id number]  Attending Physician Name and Address:  Lesa Rape, MD  Relative Name and Phone Number:       Current Level of Care: Hospital Recommended Level of Care: Skilled Nursing Facility Prior Approval Number:    Date Approved/Denied:   PASRR Number: [id number]  Discharge Plan: SNF    Current Diagnoses: Patient Active Problem List   Diagnosis Date Noted   Acute encephalopathy 10/21/2023   Acute metabolic encephalopathy 10/21/2023   History of stroke 08/01/2023   Cerebrovascular accident (CVA) due to embolism of cerebral artery (HCC) 04/04/2023   Seizure (HCC) 01/19/2023   Acute respiratory failure with hypoxia (HCC) 01/19/2023   Status epilepticus (HCC) 08/10/2022   ESRD on hemodialysis (HCC) 02/15/2022   Other disorders of phosphorus metabolism 08/18/2021   Fluid overload, unspecified 05/10/2021   Allergy, unspecified, initial encounter 04/19/2021   Anaphylactic shock, unspecified, sequela 04/19/2021   Pruritus, unspecified 04/19/2021   Hypocalcemia 04/01/2021   Long-term current use of anticonvulsant 08/04/2020   COVID-19 07/26/2020   Breakthrough seizure (HCC) 07/20/2020   Acute respiratory failure with hypoxia (HCC)    Lupus    Seizure, late effect of stroke (HCC) 08/26/2019   Secondary hyperparathyroidism of renal origin (HCC) 11/05/2018   Anemia in chronic kidney disease 09/23/2018   ESRD (end stage renal disease) (HCC) 03/18/2018   Prediabetes 01/10/2018   Chest pain in adult    CKD (chronic kidney disease) stage 3, GFR 30-59 ml/min (HCC) 10/24/2015   Seizures (HCC) 10/24/2015   HTN (hypertension) 01/12/2015    Chronic anticoagulation 04/24/2011   Lupus anticoagulant positive 04/24/2011   OBESITY, NOS 08/15/2006   Thrombocytopenia (HCC) 08/15/2006   TOBACCO DEPENDENCE 08/15/2006   CVA 08/15/2006    Orientation RESPIRATION BLADDER Height & Weight     Self, Place  Normal  (oliguria) Weight: 235 lb 3.7 oz (106.7 kg) Height:  6' 0.01" (182.9 cm)  BEHAVIORAL SYMPTOMS/MOOD NEUROLOGICAL BOWEL NUTRITION STATUS    Convulsions/Seizures Incontinent Diet (renal with fluid restriction 1200 mL)  AMBULATORY STATUS COMMUNICATION OF NEEDS Skin   Extensive Assist Verbally Normal                       Personal Care Assistance Level of Assistance  Bathing, Dressing, Feeding Bathing Assistance: Maximum assistance Feeding assistance: Limited assistance Dressing Assistance: Maximum assistance     Functional Limitations Info             SPECIAL CARE FACTORS FREQUENCY  PT (By licensed PT), OT (By licensed OT), Speech therapy     PT Frequency: 5x/wk OT Frequency: 5x/wk     Speech Therapy Frequency: 5x/wk      Contractures Contractures Info: Not present    Additional Factors Info  Code Status, Allergies Code Status Info: Full Allergies Info: Chlorhexidine , Cetirizine & Related           Current Medications (10/29/2023):  This is the current hospital active medication list Current Facility-Administered Medications  Medication Dose Route Frequency Provider Last Rate Last Admin   alteplase  (CATHFLO ACTIVASE ) injection 2 mg  2 mg Intracatheter Once PRN Mdala-Gausi, Masiku Agatha, MD  apixaban  (ELIQUIS ) tablet 5 mg  5 mg Oral BID Mdala-Gausi, Masiku Agatha, MD   5 mg at 10/29/23 1123   atorvastatin  (LIPITOR) tablet 40 mg  40 mg Oral Daily Mdala-Gausi, Masiku Agatha, MD   40 mg at 10/29/23 1123   calcitRIOL  (ROCALTROL ) capsule 2.5 mcg  2.5 mcg Oral Q M,W,F-HD Mdala-Gausi, Masiku Agatha, MD   2.5 mcg at 10/28/23 1436   cinacalcet  (SENSIPAR ) tablet 150 mg  150 mg Oral Q M,W,F-HD  Mdala-Gausi, Masiku Agatha, MD   150 mg at 10/28/23 1437   divalproex (DEPAKOTE) DR tablet 1,000 mg  1,000 mg Oral Q12H Consuelo Denmark, MD   1,000 mg at 10/29/23 1123   feeding supplement (NEPRO CARB STEADY) liquid 237 mL  237 mL Oral BID BM Mdala-Gausi, Masiku Agatha, MD   237 mL at 10/27/23 1506   heparin  injection 1,000 Units  1,000 Units Dialysis PRN Mdala-Gausi, Jilda Most, MD       hydrALAZINE  (APRESOLINE ) injection 5 mg  5 mg Intravenous Q6H PRN Chucky Craver, MD       levETIRAcetam  (KEPPRA ) tablet 500 mg  500 mg Oral BID Mdala-Gausi, Masiku Agatha, MD   500 mg at 10/29/23 1123   levETIRAcetam  (KEPPRA ) tablet 500 mg  500 mg Oral Q M,W,F-HD Khaliqdina, Salman, MD       lidocaine  (PF) (XYLOCAINE ) 1 % injection 5 mL  5 mL Intradermal PRN Mdala-Gausi, Masiku Agatha, MD       lidocaine -prilocaine  (EMLA ) cream 1 Application  1 Application Topical PRN Mdala-Gausi, Jilda Most, MD       LORazepam  (ATIVAN ) injection 2 mg  2 mg Intravenous Q4H PRN Mdala-Gausi, Masiku Agatha, MD       multivitamin (RENA-VIT) tablet 1 tablet  1 tablet Oral QHS Mdala-Gausi, Masiku Agatha, MD   1 tablet at 10/28/23 2237   pentafluoroprop-tetrafluoroeth (GEBAUERS) aerosol 1 Application  1 Application Topical PRN Mdala-Gausi, Masiku Agatha, MD       sevelamer  carbonate (RENVELA ) tablet 3,200 mg  3,200 mg Oral TID WC Mdala-Gausi, Masiku Agatha, MD   3,200 mg at 10/29/23 1218   sodium chloride  flush (NS) 0.9 % injection 10-40 mL  10-40 mL Intracatheter Q12H Mdala-Gausi, Masiku Agatha, MD   10 mL at 10/29/23 1123   sodium chloride  flush (NS) 0.9 % injection 10-40 mL  10-40 mL Intracatheter PRN Mdala-Gausi, Masiku Agatha, MD       trimethobenzamide Ellouise Haagensen) injection 200 mg  200 mg Intramuscular Once PRN Rathore, Vasundhra, MD         Discharge Medications: Please see discharge summary for a list of discharge medications.  Relevant Imaging Results:  Relevant Lab Results:   Additional Information SS#: [id number];  HD MWF at [facility] at 7:15 am  [place], [place]

## 2023-10-29 NOTE — Progress Notes (Signed)
 Carotid artery duplex has been completed. Preliminary results can be found in CV Proc through chart review.   10/29/23 10:35 AM Birda Buffy RVT

## 2023-10-29 NOTE — Progress Notes (Signed)
 PROGRESS NOTE [name] [name]  JWJ:[id number] DOB: February 28, 1981 DOA: 10/20/2023 PCP: Collins Dean, NP  Brief Narrative/Hospital Course: 43 y.o. male with history of ESRD on hemodialysis MWF, hypertension, prior stroke, history of seizures, lupus anticoagulant brought to the ER on 5/5 after patient had a seizure, encephalopathy. Neurology was consulted. Patient remains encephalopathic.  Had breakthrough seizure 5/9 while on HD, and moved to PCU and found to have new strokes in MRI  The patient has been seen by neurology and nephrology. EEG is suggestive of moderate diffuse encephalopathy.  Sharp transients were noted in the right frontal region. Per neurology ADDED ON ltm eeg for further evaluation. MRI on 5/6 showed no evidence of acute intracranial abnormality, and multiple remote infarcts.  PTOT following  Subjective Patient seen and examined this morning Aao to self place not to date or president States he lives alone Somewhat slow to respond  Assessment and Plan:  Acute right MCA patchy infarct as well as left thalamic and caudate head punctuate infarct Likely embolic, secondary to antiphospholipid syndrome with underdosed Eliquis . Neurology following, hide MRI brain 5/6 no acute infarct, MRI brain 5/11 multiple acute infarcts- SEE REPORT. MRA showed patency of major intracranial arteries. A1c 5.6.LDL: 75.  HDL: 26.  Carotid duplex no significant finding TTE:EF 70% mitral valve calcified, restricted possible rheumatic disease, moderate MS mild MR Noncompliant anticoagulation at home on 2.5 ELIQUIS  BID Now changed to 5 mg bid  Continue aggressive stroke risk modification PT OT and planning for skilled nursing facility   Acute encephalopathy Seizure disorder-Patient had a breakthrough seizure on 5/9.: Initially thought due to postictal state, but persisted.   Given recent MRI findings, it seems most likely that the encephalopathy is due to strokes involving the frontal region. TSH:  WNL.HIV: Nonreactive.B12: 539. RPR nonreactive. B1: Within normal limits. EEG notable for BL sharps as well as right frontal subclinical seizure. Discontinue Wernicke's dose thiamine . Continue delirium precaution, Depakote and Keppra    ESRD on HD MWF: Secondary hyperparathyroidism Anemia of CKD Nephrology following continue HD per schedule , monitor calcium  close and hemoglobin.   Hypertension Remains well-controlled.  Off amlodipine  and Coreg    Functional impairment Debility/deconditioning: Continue PT/OT -recommending skilled nursing facility  Class I Obesity w/ Body mass index is 31.9 kg/m.: Will benefit with PCP follow-up, weight loss,healthy lifestyle and outpatient sleep eval if not done.  DVT prophylaxis:  Code Status:   Code Status: Full Code Family Communication: plan of care discussed with patient at bedside. Patient status is: Remains hospitalized because of severity of illness Level of care: Progressive   Dispo: The patient is from: home. Unmarried. Next of kin is [name] per him.            Anticipated disposition: snf Objective: Vitals last 24 hrs: Vitals:   10/28/23 1600 10/29/23 0022 10/29/23 0749 10/29/23 1118  BP: (!) 131/90 (!) 152/94 134/80 135/64  Pulse: 88 89 68 69  Resp: 20 20 20 18   Temp: 97.6 F (36.4 C) 97.9 F (36.6 C) 98.1 F (36.7 C) 98.4 F (36.9 C)  TempSrc: Axillary Axillary Oral Oral  SpO2: 96% 97% 100% 92%  Weight:      Height:        Physical Examination: General exam: alert awake, oriented x 2 HEENT:Oral mucosa moist, Ear/Nose WNL grossly Respiratory system: Bilaterally clear BS, no use of accessory muscle Cardiovascular system: S1 & S2 +. Gastrointestinal system: Abdomen soft,  NT,ND,BS+ Nervous System: Alert, awake, oriented x 2 following commands but poor  co-ordinaiton and weaker left side. Extremities: LE edema neg, warm extremities Skin: No rashes,warm. MSK: Normal muscle bulk/tone.   Data Reviewed: I have  personally reviewed following labs and imaging studies ( see epic result tab) CBC: Recent Labs  Lab 10/23/23 0830 10/25/23 0646 10/26/23 0732 10/27/23 0711 10/28/23 0709  WBC 8.2 10.8* 9.8 7.7 7.4  NEUTROABS  --  7.7  --   --   --   HGB 11.5* 13.1 13.7 13.0 12.7*  HCT 35.2* 38.2* 40.7 39.1 37.8*  MCV 92.9 91.2 90.8 92.7 92.2  PLT 144* 126* 106* 104* 95*   CMP: Recent Labs  Lab 10/23/23 0830 10/23/23 2032 10/25/23 0646 10/26/23 0732 10/27/23 0711 10/28/23 0709 10/29/23 0615  NA 139   < > 136 138 137 138 136  K 4.1   < > 4.1 4.6 4.3 4.3 4.0  CL 99   < > 95* 97* 95* 92* 92*  CO2 26   < > 22 21* 24 22 26   GLUCOSE 93   < > 108* 84 95 77 92  BUN 46*   < > 55* 42* 62* 82* 50*  CREATININE 17.76*   < > 17.39* 13.86* 18.16* 20.54* 15.60*  CALCIUM  9.6   < > 9.6 10.3 9.7 10.0 9.0  MG  --   --   --  2.4  --   --   --   PHOS 7.6*  --  7.5* 7.8*  --   --   --    < > = values in this interval not displayed.   GFR: Estimated Creatinine Clearance: 7.6 mL/min (A) (by C-G formula based on SCr of 15.6 mg/dL (H)). Recent Labs  Lab 10/23/23 0830  ALBUMIN  3.3*   No results for input(s): "LIPASE", "AMYLASE" in the last 168 hours.  Recent Labs  Lab 10/27/23 0711  AMMONIA 30   Coagulation Profile: No results for input(s): "INR", "PROTIME" in the last 168 hours. Unresulted Labs (From admission, onward)     Start     Ordered   10/26/23 0500  Basic metabolic panel with GFR  Daily,   R     Question:  Specimen collection method  Answer:  Lab=Lab collect   10/25/23 1707           Antimicrobials/Microbiology: Anti-infectives (From admission, onward)    None         Component Value Date/Time   SDES BLOOD RIGHT HAND 01/19/2023 0213   SPECREQUEST  01/19/2023 7829    BOTTLES DRAWN AEROBIC AND ANAEROBIC Blood Culture results may not be optimal due to an excessive volume of blood received in culture bottles   CULT  01/19/2023 0213    NO GROWTH 5 DAYS Performed at [facility], [address] [place], [place] [address]    REPTSTATUS 01/24/2023 FINAL 01/19/2023 0865     Medications reviewed:  Scheduled Meds:  apixaban   5 mg Oral BID   atorvastatin   40 mg Oral Daily   calcitRIOL   2.5 mcg Oral Q M,W,F-HD   cinacalcet   150 mg Oral Q M,W,F-HD   divalproex  1,000 mg Oral Q12H   feeding supplement (NEPRO CARB STEADY)  237 mL Oral BID BM   levETIRAcetam   500 mg Oral BID   levETIRAcetam   500 mg Oral Q M,W,F-HD   multivitamin  1 tablet Oral QHS   sevelamer  carbonate  3,200 mg Oral TID WC   sodium chloride  flush  10-40 mL Intracatheter Q12H   Continuous Infusions:  Lesa Rape, MD [facility] 10/29/2023, 12:56 PM

## 2023-10-29 NOTE — Plan of Care (Signed)
 Problem: Education: Goal: Knowledge of General Education information will improve Description: Including pain rating scale, medication(s)/side effects and non-pharmacologic comfort measures Outcome: Progressing   Problem: Health Behavior/Discharge Planning: Goal: Ability to manage health-related needs will improve Outcome: Progressing   Problem: Clinical Measurements: Goal: Ability to maintain clinical measurements within normal limits will improve Outcome: Progressing Goal: Will remain free from infection Outcome: Progressing Goal: Diagnostic test results will improve Outcome: Progressing Goal: Respiratory complications will improve Outcome: Progressing Goal: Cardiovascular complication will be avoided Outcome: Progressing   Problem: Activity: Goal: Risk for activity intolerance will decrease Outcome: Progressing   Problem: Nutrition: Goal: Adequate nutrition will be maintained Outcome: Progressing   Problem: Coping: Goal: Level of anxiety will decrease Outcome: Progressing   Problem: Elimination: Goal: Will not experience complications related to bowel motility Outcome: Progressing Goal: Will not experience complications related to urinary retention Outcome: Progressing   Problem: Pain Managment: Goal: General experience of comfort will improve and/or be controlled Outcome: Progressing   Problem: Safety: Goal: Ability to remain free from injury will improve Outcome: Progressing   Problem: Skin Integrity: Goal: Risk for impaired skin integrity will decrease Outcome: Progressing   Problem: Education: Goal: Knowledge of disease and its progression will improve Outcome: Progressing Goal: Individualized Educational Video(s) Outcome: Progressing   Problem: Fluid Volume: Goal: Compliance with measures to maintain balanced fluid volume will improve Outcome: Progressing   Problem: Health Behavior/Discharge Planning: Goal: Ability to manage health-related needs  will improve Outcome: Progressing   Problem: Nutritional: Goal: Ability to make healthy dietary choices will improve Outcome: Progressing   Problem: Clinical Measurements: Goal: Complications related to the disease process, condition or treatment will be avoided or minimized Outcome: Progressing   Problem: Education: Goal: Knowledge of General Education information will improve Description: Including pain rating scale, medication(s)/side effects and non-pharmacologic comfort measures Outcome: Progressing   Problem: Health Behavior/Discharge Planning: Goal: Ability to manage health-related needs will improve Outcome: Progressing   Problem: Clinical Measurements: Goal: Ability to maintain clinical measurements within normal limits will improve Outcome: Progressing Goal: Will remain free from infection Outcome: Progressing Goal: Diagnostic test results will improve Outcome: Progressing Goal: Respiratory complications will improve Outcome: Progressing Goal: Cardiovascular complication will be avoided Outcome: Progressing   Problem: Activity: Goal: Risk for activity intolerance will decrease Outcome: Progressing   Problem: Nutrition: Goal: Adequate nutrition will be maintained Outcome: Progressing   Problem: Coping: Goal: Level of anxiety will decrease Outcome: Progressing   Problem: Elimination: Goal: Will not experience complications related to bowel motility Outcome: Progressing Goal: Will not experience complications related to urinary retention Outcome: Progressing   Problem: Pain Managment: Goal: General experience of comfort will improve and/or be controlled Outcome: Progressing   Problem: Safety: Goal: Ability to remain free from injury will improve Outcome: Progressing   Problem: Skin Integrity: Goal: Risk for impaired skin integrity will decrease Outcome: Progressing   Problem: Education: Goal: Knowledge of disease and its progression will  improve Outcome: Progressing Goal: Individualized Educational Video(s) Outcome: Progressing   Problem: Fluid Volume: Goal: Compliance with measures to maintain balanced fluid volume will improve Outcome: Progressing   Problem: Health Behavior/Discharge Planning: Goal: Ability to manage health-related needs will improve Outcome: Progressing   Problem: Nutritional: Goal: Ability to make healthy dietary choices will improve Outcome: Progressing   Problem: Clinical Measurements: Goal: Complications related to the disease process, condition or treatment will be avoided or minimized Outcome: Progressing   Problem: Education: Goal: Knowledge of General Education information will improve Description: Including pain rating  scale, medication(s)/side effects and non-pharmacologic comfort measures Outcome: Progressing   Problem: Health Behavior/Discharge Planning: Goal: Ability to manage health-related needs will improve Outcome: Progressing   Problem: Clinical Measurements: Goal: Ability to maintain clinical measurements within normal limits will improve Outcome: Progressing Goal: Will remain free from infection Outcome: Progressing Goal: Diagnostic test results will improve Outcome: Progressing Goal: Respiratory complications will improve Outcome: Progressing Goal: Cardiovascular complication will be avoided Outcome: Progressing   Problem: Activity: Goal: Risk for activity intolerance will decrease Outcome: Progressing   Problem: Nutrition: Goal: Adequate nutrition will be maintained Outcome: Progressing   Problem: Coping: Goal: Level of anxiety will decrease Outcome: Progressing   Problem: Elimination: Goal: Will not experience complications related to bowel motility Outcome: Progressing Goal: Will not experience complications related to urinary retention Outcome: Progressing   Problem: Pain Managment: Goal: General experience of comfort will improve and/or be  controlled Outcome: Progressing   Problem: Safety: Goal: Ability to remain free from injury will improve Outcome: Progressing   Problem: Skin Integrity: Goal: Risk for impaired skin integrity will decrease Outcome: Progressing   Problem: Education: Goal: Knowledge of disease and its progression will improve Outcome: Progressing Goal: Individualized Educational Video(s) Outcome: Progressing   Problem: Fluid Volume: Goal: Compliance with measures to maintain balanced fluid volume will improve Outcome: Progressing   Problem: Health Behavior/Discharge Planning: Goal: Ability to manage health-related needs will improve Outcome: Progressing   Problem: Nutritional: Goal: Ability to make healthy dietary choices will improve Outcome: Progressing   Problem: Clinical Measurements: Goal: Complications related to the disease process, condition or treatment will be avoided or minimized Outcome: Progressing

## 2023-10-29 NOTE — Hospital Course (Addendum)
 43 y.o. male with history of ESRD on hemodialysis MWF, hypertension, prior stroke, history of seizures, lupus anticoagulant brought to the ER on 5/5 after patient had a seizure, encephalopathy. Neurology was consulted. Patient remains encephalopathic.  Had breakthrough seizure 5/9 while on HD, and moved to PCU and found to have new strokes in MRI  The patient has been seen by neurology and nephrology. EEG is suggestive of moderate diffuse encephalopathy.  Sharp transients were noted in the right frontal region. Per neurology ADDED ON ltm eeg for further evaluation. MRI on 5/6 showed no evidence of acute intracranial abnormality, and multiple remote infarcts.  PTOT following  Subjective Patient seen and examined  Put on the table has not started eating  Alert awake neurologically not much changed   Assessment and Plan:  Acute right MCA patchy infarct/ left thalamic and caudate head punctuate infarct: Likely embolic, secondary to antiphospholipid syndrome with underdosed Eliquis . Neurology following, MRI brain 5/6 no acute infarct, MRI brain 5/11 multiple acute infarcts S/p completion of stroke workup: MRA patency of major intracranial arteries. A1c 5.6.LDL: 75.  HDL: 26.  Carotid duplex no significant finding TTE:EF 70% mitral valve calcified, restricted possible rheumatic disease, mod MS mild MR Noncompliant anticoagulation at home on 2.5 ELIQUIS  BID Continue Eliquis  at 5 mg twice daily, started on Lipitor 40 Cont aggressive stroke risk modification , PTOT W/ Plan for SNF  Acute encephalopathy Seizure disorder w/ breakthrough seizure on 10/25/23: Initially thought due to postictal state, but persisted. S.p MRI- most llikely that the encephalopathy is 2/2 strokes involving the frontal region. Work up [employer]: WNL.HIV: Nonreactive.B12: 539 B1 nl, RPR nonreactive. B1: Within EEG notable for BL sharps as well as right frontal subclinical seizure.Off high-dose.  Mentation overall  stable alert awake following commands appropriately not agitated.  Neurology signed off, continue on increased dose of Keppra  500 twice daily and 500 MWF, Depakote  1000 mg q12  ESRD on HD MWF: Secondary hyperparathyroidism/MBD Anemia of CKD Nephrology following continue HD-had HD 5/20 without any issues or hypotension  Continue to monitor calcium  close and hemoglobin intermittently. Continue Renvela , calcitriol  and Sensipar    Hypertension Well-controlled . Off amlodipine  and Coreg  due to hypotension   Functional impairment Debility/deconditioning: Continue PT/OT -recommending skilled nursing facility> medically stable and awaiting for placement

## 2023-10-29 NOTE — Progress Notes (Addendum)
 Speech Language Pathology Treatment: Dysphagia;Cognitive-Linquistic  Patient Details Name: [name] [name] MRN: [id number] DOB: [birth year] Today's Date: 10/29/2023 Time: 7425-9563 SLP Time Calculation (min) (ACUTE ONLY): 10 min  Assessment / Plan / Recommendation Clinical Impression  Pt presents with an improved level of alertness compared to previous session. Pt states that PTA he was independent with medication and money management, although SLP notes from admission in August and October 2024 report severe cognitive impairments. He presents with a flat affect and only makes eye contact when cued to do so. Sustained attention and memory storage are also impaired. Pt is a questionable historian, although suspect cognitive impairment is not an acute change as his presentation is similar to that of SLP documentation from previous admissions.   He was attentive to POs after receiving Mod verbal cueing for initiation of self-feeding. No s/s of dysphagia or aspiration were observed with trials of thin liquids or solids. Suspect there is potential for fluctuating performance pending mentation so he may still benefit from increased supervision at mealtimes. Recommend continuing regular diet with thin liquids. No further SLP f/u is necessary for swallowing but SLP will continue following at least briefly to address cognitive goals.    HPI HPI: [name] [name] is a 43 yo male presenting to ED 5/5 after having a seizure 5/2 and missing HD. He initially left AMA but returned. LTM EEG showed moderate diffuse encephalopathy and one seizure without clinical signs arising from the R frontal region. Seen by SLP 03/2023 targeting goals related to problem solving, selective attention, and auditory comprehension. PMH includes ESRD on HD, HTN, prior stroke, seizures, lupus      SLP Plan  Goals updated      Recommendations for follow up therapy are one component of a multi-disciplinary discharge planning process, led  by the attending physician.  Recommendations may be updated based on patient status, additional functional criteria and insurance authorization.    Recommendations  Diet recommendations: Regular;Thin liquid Liquids provided via: Cup;Straw Medication Administration: Whole meds with puree Supervision: Patient able to self feed Compensations: Minimize environmental distractions;Slow rate;Small sips/bites Postural Changes and/or Swallow Maneuvers: Seated upright 90 degrees                  Oral care BID   Frequent or constant Supervision/Assistance Dysphagia, unspecified (R13.10);Cognitive communication deficit (O75.643)     Goals updated     [name], M.A., CCC-SLP Speech Language Pathology, Acute Rehabilitation Services  Secure Chat preferred [phone]   10/29/2023, 3:13 PM

## 2023-10-29 NOTE — Plan of Care (Signed)
  Problem: Ischemic Stroke/TIA Tissue Perfusion: Goal: Complications of ischemic stroke/TIA will be minimized Outcome: Progressing   Problem: Coping: Goal: Will identify appropriate support needs Outcome: Progressing   Problem: Self-Care: Goal: Ability to communicate needs accurately will improve Outcome: Progressing   Problem: Nutrition: Goal: Risk of aspiration will decrease Outcome: Progressing Goal: Dietary intake will improve Outcome: Progressing

## 2023-10-29 NOTE — TOC Progression Note (Signed)
 Transition of Care [facility]) - Progression Note    Patient Details  Name: [name] [name] MRN: [id number] Date of Birth: [birth year]  [facility]) CM/SW Contact  Tandy Fam, [place] Phone Number: 10/29/2023, 3:39 PM  Clinical Narrative:   CSW met with patient to discuss recommendation for SNF placement. Patient in agreement, says he has not been to SNF before. CSW explained process of referrals and will update patient with CMS choice list of bed offers. CSW asked patient about his outpatient dialysis and patient could not remember his chair time. Patient indicated that he usually takes the bus but has also used [employer] for transportation. CSW completed referral and faxed out, will follow.    Expected Discharge Plan: Skilled Nursing Facility Barriers to Discharge: Continued Medical Work up, English as a second language teacher  Expected Discharge Plan and Services                                               Social Determinants of Health (SDOH) Interventions SDOH Screenings   Food Insecurity: No Food Insecurity (10/21/2023)  Housing: High Risk (10/21/2023)  Transportation Needs: No Transportation Needs (10/21/2023)  Utilities: Not At Risk (10/21/2023)  Alcohol Screen: Low Risk  (03/26/2023)  Depression (PHQ2-9): Low Risk  (03/26/2023)  Financial Resource Strain: Low Risk  (03/26/2023)  Physical Activity: Inactive (03/26/2023)  Social Connections: Moderately Isolated (10/21/2023)  Stress: No Stress Concern Present (03/26/2023)  Tobacco Use: High Risk (10/21/2023)  Health Literacy: Adequate Health Literacy (03/26/2023)    Readmission Risk Interventions    03/28/2023    2:57 PM 01/25/2023    3:40 PM  Readmission Risk Prevention Plan  Post Dischage Appt    Medication Screening    Transportation Screening Complete   Medication Review (RN Care Manager) Referral to Pharmacy   PCP or Specialist appointment within 3-5 days of discharge Complete   HRI or Home Care Consult Complete   SW  Recovery Care/Counseling Consult Complete   Palliative Care Screening Not Applicable   Skilled Nursing Facility Not Applicable      Information is confidential and restricted. Go to Review Flowsheets to unlock data.

## 2023-10-29 NOTE — Evaluation (Signed)
 Occupational Therapy Evaluation Patient Details Name: [name] [name] MRN: [id number] DOB: 08-01-1980 Today's Date: 10/29/2023   History of Present Illness   Patient is a 43 year old male with witnessed seizure at dialysis on 10/21/2023. Patient leaving AMA, and returning a few hours later via [employer]. CT clear, and EEG showing moderate diffuse encephalopathy. MRI 10/27/23 showing Multiple acute infarcts in the right frontal, parietal and posterior temporal cortex as well as the adjacent subcortical white matter and punctate acute infarcts in the left basal ganglia, thalamus and overlying white matter. PMH: ESRD on HD, antiphospholipid syndrome, HTN, seizure, stroke.     Clinical Impressions Unsure of prior level or living situation, however during last admission, pt previously lived in a motel, was independent with ADL tasks and took the bus to dialysis. Currently, Pt requires overall mod A with mobility and ADL tasks due to below listed deficits. Patient will benefit from continued inpatient follow up therapy, <3 hours/day to maximize functional level of independence. Acute OT to follow.      If plan is discharge home, recommend the following:   Two people to help with walking and/or transfers;Two people to help with bathing/dressing/bathroom;Assistance with cooking/housework;Direct supervision/assist for medications management;Direct supervision/assist for financial management;Assist for transportation;Help with stairs or ramp for entrance     Functional Status Assessment   Patient has had a recent decline in their functional status and demonstrates the ability to make significant improvements in function in a reasonable and predictable amount of time.     Equipment Recommendations   Other (comment) (TBA)     Recommendations for Other Services         Precautions/Restrictions   Precautions Precautions: Fall Restrictions Weight Bearing Restrictions Per Provider Order: No      Mobility Bed Mobility Overal bed mobility: Needs Assistance Bed Mobility: Supine to Sit, Sit to Supine     Supine to sit: Min assist Sit to supine: Min assist   General bed mobility comments: intermittent assistance for LLE support    Transfers Overall transfer level: Needs assistance Equipment used: Rolling walker (2 wheels), None Transfers: Sit to/from Stand Sit to Stand: Mod assist                  Balance Overall balance assessment: Needs assistance Sitting-balance support: Feet supported Sitting balance-Leahy Scale: Fair     Standing balance support: Bilateral upper extremity supported, Reliant on assistive device for balance Standing balance-Leahy Scale: Poor Standing balance comment: external support required in addition to rolling walker                           ADL either performed or assessed with clinical judgement   ADL Overall ADL's : Needs assistance/impaired Eating/Feeding: Maximal assistance Eating/Feeding Details (indicate cue type and reason): difficulty sustaining attention and initating movements for self feeding Grooming: Moderate assistance Grooming Details (indicate cue type and reason): mod cues to start, organzie and complete task; abl eto physically brush teeth and wash face however required moderate VC for intitation and completion of task Upper Body Bathing: Moderate assistance   Lower Body Bathing: Maximal assistance;Bed level   Upper Body Dressing : Maximal assistance   Lower Body Dressing: Maximal assistance;Sit to/from stand               Functional mobility during ADLs: Moderate assistance General ADL Comments: sit - stand with min A however uncontrolled descent     Vision   Vision Assessment?:  Vision impaired- to be further tested in functional context Additional Comments: Hx of L PCA CVA wtih R field cut; will further assess as cognition improves; missing targets during session     Perception  Perception: Impaired       Praxis Praxis: Impaired Praxis Impairment Details: Initiation, Perseveration, Organization, Motor planning     Pertinent Vitals/Pain Pain Assessment Pain Assessment: Faces Faces Pain Scale: No hurt     Extremity/Trunk Assessment Upper Extremity Assessment Upper Extremity Assessment: Right hand dominant;RUE deficits/detail;LUE deficits/detail RUE Deficits / Details: using R hand at times through functional ROM; generalized weakness RUE Coordination: decreased fine motor LUE Deficits / Details: shoulder dysfunction at baseline; appears to have a RTC insufficiency; able to copmlete full ROM of shoulder using assistance of RUE; slowed movement patterns; gross grasp/release; poor in-hand manipulation skills LUE Coordination: decreased fine motor;decreased gross motor   Lower Extremity Assessment Lower Extremity Assessment: Defer to PT evaluation   Cervical / Trunk Assessment Cervical / Trunk Assessment: Other exceptions (hx of cervical issues)   Communication Communication Communication: Impaired   Cognition Arousal: Alert Behavior During Therapy: Flat affect, Restless Cognition: Cognition impaired  Able to state his birthday Orientation impairments: Place, Time, Situation Awareness: Intellectual awareness impaired, Online awareness impaired Memory impairment (select all impairments): Short-term memory, Working [employer] fast streamer, Conservation officer, historic buildings, Non-declarative long-term memory Attention impairment (select first level of impairment): Sustained attention Executive functioning impairment (select all impairments): Initiation, Organization, Sequencing, Reasoning, Problem solving                   Following commands: Impaired Following commands impaired: Follows one step commands inconsistently     Cueing  General Comments   Cueing Techniques: Verbal cues;Tactile cues;Visual cues;Gestural cues  VSS   Exercises     Shoulder Instructions       Home Living Family/patient expects to be discharged to:: Unsure (unsure, patient reports he lives in an apartment) Living Arrangements: Other (Comment) (unsure, patient reports he lives in a apartment with 2 roommates)                               Additional Comments: previously lived in a motel; told PT he lived in an apt      Prior Functioning/Environment Prior Level of Function : Independent/Modified Independent             Mobility Comments: patient reports he walks to the bus stop for transporation to HD and is independent with mobility at baseline      OT Problem List: Decreased strength;Decreased range of motion;Decreased activity tolerance;Impaired balance (sitting and/or standing);Impaired vision/perception;Decreased coordination;Decreased cognition;Decreased safety awareness;Decreased knowledge of use of DME or AE;Decreased knowledge of precautions;Impaired UE functional use   OT Treatment/Interventions: Self-care/ADL training;Therapeutic exercise;Neuromuscular education;DME and/or AE instruction;Therapeutic activities;Cognitive remediation/compensation;Visual/perceptual remediation/compensation;Patient/family education;Balance training      OT Goals(Current goals can be found in the care plan section)   Acute Rehab OT Goals Patient Stated Goal: none stated OT Goal Formulation: Patient unable to participate in goal setting Time For Goal Achievement: 11/12/23 Potential to Achieve Goals: Fair   OT Frequency:  Min 2X/week    Co-evaluation              AM-PAC OT "6 Clicks" Daily Activity     Outcome Measure Help from another person eating meals?: A Lot Help from another person taking care of personal grooming?: A Lot Help from another person toileting, which includes using toliet,  bedpan, or urinal?: A Lot Help from another person bathing (including washing, rinsing, drying)?: A Lot Help from another person to put on and taking off regular  upper body clothing?: A Lot Help from another person to put on and taking off regular lower body clothing?: A Lot 6 Click Score: 12   End of Session Equipment Utilized During Treatment: Gait belt Nurse Communication: Mobility status  Activity Tolerance: Patient tolerated treatment well Patient left: in bed;with call bell/phone within reach;with bed alarm set (modified chair position)  OT Visit Diagnosis: Unsteadiness on feet (R26.81);Other abnormalities of gait and mobility (R26.89);Muscle weakness (generalized) (M62.81);Other symptoms and signs involving the nervous system (R29.898);Other symptoms and signs involving cognitive function                Time: 0949-1010 OT Time Calculation (min): 21 min Charges:  OT General Charges $OT Visit: 1 Visit OT Evaluation $OT Eval Moderate Complexity: 1 Mod  Azazel Franze, OT/L   Acute OT Clinical Specialist Acute Rehabilitation Services Pager [phone] Office [phone]   [facility] 10/29/2023, 3:01 PM

## 2023-10-29 NOTE — Evaluation (Signed)
 Physical Therapy Evaluation Patient Details Name: [name] [name] MRN: [id number] DOB: [birth year] Today's Date: 10/29/2023  History of Present Illness  Patient is a 43 year old male with witnessed seizure at dialysis on 10/21/2023. Patient leaving AMA, and returning a few hours later via [employer]. CT clear, and EEG showing moderate diffuse encephalopathy. MRI 10/27/23 showing Multiple acute infarcts in the right frontal, parietal and posterior temporal cortex as well as the adjacent subcortical white matter and punctate acute infarcts in the left basal ganglia, thalamus and overlying white matter. PMH: ESRD on HD, antiphospholipid syndrome, HTN, seizure, stroke.   Clinical Impression  Patient is agreeable to PT evaluation. Patient reports he is independent with walking at baseline and routinely walks to the bus for transportation to hemodialysis. He is confused and unsure where he lives but reports he may live in an apartment with roommates.  Today the patient required assistance for bed mobility and standing. Increased independence with standing with rolling walker compared to no device. Standing balance is poor and limited for progression of ambulation at this time. The patient is not at his baseline level of functional independence. Recommend PT follow up to maximize independence and decrease caregiver burden. Anticipate the need for rehabilitation < 3 hours/day after this hospital stay.       If plan is discharge home, recommend the following: A lot of help with walking and/or transfers;A lot of help with bathing/dressing/bathroom;Assistance with cooking/housework;Supervision due to cognitive status;Help with stairs or ramp for entrance   Can travel by private vehicle   No    Equipment Recommendations Rolling walker (2 wheels)  Recommendations for Other Services       Functional Status Assessment Patient has had a recent decline in their functional status and demonstrates the ability to make  significant improvements in function in a reasonable and predictable amount of time.     Precautions / Restrictions Precautions Precautions: Fall Restrictions Weight Bearing Restrictions Per Provider Order: No      Mobility  Bed Mobility Overal bed mobility: Needs Assistance Bed Mobility: Supine to Sit, Sit to Supine     Supine to sit: Min assist Sit to supine: Min assist   General bed mobility comments: intermittent assistance for LLE support    Transfers Overall transfer level: Needs assistance Equipment used: Rolling walker (2 wheels), None Transfers: Sit to/from Stand Sit to Stand: Max assist, Min assist           General transfer comment: Max A for first stand without rolling walker. Min A for second stand with rolling walker. cues for anterior weight shifting and hand placement    Ambulation/Gait             Pre-gait activities: patient able to take several side steps with Min A using rolling walker. further walking self limited secondary to reported leg weakness and fatigue with standing activity    Stairs            Wheelchair Mobility     Tilt Bed    Modified Rankin (Stroke Patients Only) Modified Rankin (Stroke Patients Only) Pre-Morbid Rankin Score: Slight disability Modified Rankin: Moderately severe disability     Balance Overall balance assessment: Needs assistance Sitting-balance support: Feet supported Sitting balance-Leahy Scale: Fair     Standing balance support: Bilateral upper extremity supported, Reliant on assistive device for balance Standing balance-Leahy Scale: Poor Standing balance comment: external support required in addition to rolling walker  Pertinent Vitals/Pain Pain Assessment Pain Assessment: No/denies pain    Home Living Family/patient expects to be discharged to:: Unsure (unsure, patient reports he lives in an apartment) Living Arrangements: Other (Comment)  (unsure, patient reports he lives in a apartment with 2 roommates)                      Prior Function Prior Level of Function : Independent/Modified Independent             Mobility Comments: patient reports he walks to the bus stop for transporation to HD and is independent with mobility at baseline       Extremity/Trunk Assessment   Upper Extremity Assessment Upper Extremity Assessment: Defer to OT evaluation (mild LUE deficits noted)    Lower Extremity Assessment Lower Extremity Assessment: LLE deficits/detail;Generalized weakness;RLE deficits/detail RLE Deficits / Details: dorsiflexion 5/5, knee extension 4+/5. endurance impaired for sustianed activity in standing LLE Deficits / Details: 4/5 knee extension, 5/5 plantaflexion. some difficulty with following complex commands for formal MMT       Communication   Communication Communication: No apparent difficulties    Cognition Arousal: Alert Behavior During Therapy: Flat affect   PT - Cognitive impairments: Orientation, Initiation   Orientation impairments: Place, Time, Situation                   PT - Cognition Comments: slow to respond at times. increased time for motor planning, cues for initiation and sequencing Following commands: Impaired Following commands impaired: Follows one step commands with increased time     Cueing Cueing Techniques: Verbal cues, Tactile cues, Visual cues     General Comments General comments (skin integrity, edema, etc.): heart rate in the 80's with mobility with no shortness of breath noted with activity    Exercises     Assessment/Plan    PT Assessment Patient needs continued PT services  PT Problem List Decreased strength;Decreased range of motion;Decreased activity tolerance;Decreased mobility;Decreased balance;Decreased safety awareness       PT Treatment Interventions DME instruction;Gait training;Stair training;Functional mobility training;Therapeutic  activities;Therapeutic exercise;Neuromuscular re-education;Balance training;Cognitive remediation;Patient/family education;Wheelchair mobility training    PT Goals (Current goals can be found in the Care Plan section)  Acute Rehab PT Goals Patient Stated Goal: none stated PT Goal Formulation: Patient unable to participate in goal setting Time For Goal Achievement: 11/12/23 Potential to Achieve Goals: Fair    Frequency Min 2X/week     Co-evaluation               AM-PAC PT "6 Clicks" Mobility  Outcome Measure Help needed turning from your back to your side while in a flat bed without using bedrails?: A Little Help needed moving from lying on your back to sitting on the side of a flat bed without using bedrails?: A Little Help needed moving to and from a bed to a chair (including a wheelchair)?: A Little Help needed standing up from a chair using your arms (e.g., wheelchair or bedside chair)?: A Little Help needed to walk in hospital room?: A Lot Help needed climbing 3-5 steps with a railing? : Total 6 Click Score: 15    End of Session Equipment Utilized During Treatment: Gait belt Activity Tolerance: Patient limited by fatigue Patient left: in bed;with call bell/phone within reach;with bed alarm set Nurse Communication: Mobility status PT Visit Diagnosis: Muscle weakness (generalized) (M62.81);Unsteadiness on feet (R26.81)    Time: 4098-1191 PT Time Calculation (min) (ACUTE ONLY): 24 min   Charges:  PT Evaluation $PT Eval Moderate Complexity: 1 Mod   PT General Charges $$ ACUTE PT VISIT: 1 Visit         Ozie Bo, PT, MPT   Erlene Hawks 10/29/2023, 10:41 AM

## 2023-10-30 DIAGNOSIS — I6381 Other cerebral infarction due to occlusion or stenosis of small artery: Secondary | ICD-10-CM | POA: Diagnosis not present

## 2023-10-30 DIAGNOSIS — I63411 Cerebral infarction due to embolism of right middle cerebral artery: Secondary | ICD-10-CM | POA: Diagnosis not present

## 2023-10-30 DIAGNOSIS — T45516A Underdosing of anticoagulants, initial encounter: Secondary | ICD-10-CM | POA: Diagnosis not present

## 2023-10-30 DIAGNOSIS — D6861 Antiphospholipid syndrome: Secondary | ICD-10-CM | POA: Diagnosis not present

## 2023-10-30 DIAGNOSIS — Z8669 Personal history of other diseases of the nervous system and sense organs: Secondary | ICD-10-CM

## 2023-10-30 DIAGNOSIS — G9340 Encephalopathy, unspecified: Secondary | ICD-10-CM | POA: Diagnosis not present

## 2023-10-30 LAB — BASIC METABOLIC PANEL WITH GFR
Anion gap: 20 — ABNORMAL HIGH (ref 5–15)
BUN: 65 mg/dL — ABNORMAL HIGH (ref 6–20)
CO2: 23 mmol/L (ref 22–32)
Calcium: 9.3 mg/dL (ref 8.9–10.3)
Chloride: 92 mmol/L — ABNORMAL LOW (ref 98–111)
Creatinine, Ser: 18.25 mg/dL — ABNORMAL HIGH (ref 0.61–1.24)
GFR, Estimated: 3 mL/min — ABNORMAL LOW (ref >60)
Glucose, Bld: 78 mg/dL (ref 70–99)
Potassium: 4.7 mmol/L (ref 3.5–5.1)
Sodium: 135 mmol/L (ref 135–145)

## 2023-10-30 LAB — CBC
HCT: 39.5 % (ref 39.0–52.0)
Hemoglobin: 13.3 g/dL (ref 13.0–17.0)
MCH: 30.9 pg (ref 26.0–34.0)
MCHC: 33.7 g/dL (ref 30.0–36.0)
MCV: 91.6 fL (ref 80.0–100.0)
Platelets: 73 10*3/uL — ABNORMAL LOW (ref 150–400)
RBC: 4.31 MIL/uL (ref 4.22–5.81)
RDW: 13.6 % (ref 11.5–15.5)
WBC: 7.6 10*3/uL (ref 4.0–10.5)
nRBC: 0 % (ref 0.0–0.2)

## 2023-10-30 LAB — VALPROIC ACID LEVEL: Valproic Acid Lvl: 83 ug/mL (ref 50–100)

## 2023-10-30 NOTE — Plan of Care (Signed)
  Problem: Education: Goal: Knowledge of General Education information will improve Description: Including pain rating scale, medication(s)/side effects and non-pharmacologic comfort measures Outcome: Progressing   Problem: Health Behavior/Discharge Planning: Goal: Ability to manage health-related needs will improve Outcome: Progressing   Problem: Clinical Measurements: Goal: Ability to maintain clinical measurements within normal limits will improve Outcome: Progressing Goal: Will remain free from infection Outcome: Progressing Goal: Diagnostic test results will improve Outcome: Progressing Goal: Respiratory complications will improve Outcome: Progressing Goal: Cardiovascular complication will be avoided Outcome: Progressing   Problem: Activity: Goal: Risk for activity intolerance will decrease Outcome: Progressing   Problem: Nutrition: Goal: Adequate nutrition will be maintained Outcome: Progressing   Problem: Coping: Goal: Level of anxiety will decrease Outcome: Progressing   Problem: Elimination: Goal: Will not experience complications related to bowel motility Outcome: Progressing Goal: Will not experience complications related to urinary retention Outcome: Progressing   Problem: Pain Managment: Goal: General experience of comfort will improve and/or be controlled Outcome: Progressing   Problem: Safety: Goal: Ability to remain free from injury will improve Outcome: Progressing   Problem: Skin Integrity: Goal: Risk for impaired skin integrity will decrease Outcome: Progressing   Problem: Education: Goal: Knowledge of disease and its progression will improve Outcome: Progressing Goal: Individualized Educational Video(s) Outcome: Progressing   Problem: Fluid Volume: Goal: Compliance with measures to maintain balanced fluid volume will improve Outcome: Progressing   Problem: Health Behavior/Discharge Planning: Goal: Ability to manage health-related needs  will improve Outcome: Progressing   Problem: Nutritional: Goal: Ability to make healthy dietary choices will improve Outcome: Progressing   Problem: Clinical Measurements: Goal: Complications related to the disease process, condition or treatment will be avoided or minimized Outcome: Progressing   Problem: Education: Goal: Knowledge of disease or condition will improve Outcome: Progressing Goal: Knowledge of secondary prevention will improve (MUST DOCUMENT ALL) Outcome: Progressing Goal: Knowledge of patient specific risk factors will improve (DELETE if not current risk factor) Outcome: Progressing   Problem: Ischemic Stroke/TIA Tissue Perfusion: Goal: Complications of ischemic stroke/TIA will be minimized Outcome: Progressing   Problem: Coping: Goal: Will verbalize positive feelings about self Outcome: Progressing Goal: Will identify appropriate support needs Outcome: Progressing   Problem: Health Behavior/Discharge Planning: Goal: Ability to manage health-related needs will improve Outcome: Progressing Goal: Goals will be collaboratively established with patient/family Outcome: Progressing   Problem: Self-Care: Goal: Ability to participate in self-care as condition permits will improve Outcome: Progressing Goal: Verbalization of feelings and concerns over difficulty with self-care will improve Outcome: Progressing Goal: Ability to communicate needs accurately will improve Outcome: Progressing   Problem: Nutrition: Goal: Risk of aspiration will decrease Outcome: Progressing Goal: Dietary intake will improve Outcome: Progressing

## 2023-10-30 NOTE — Progress Notes (Signed)
 PROGRESS NOTE ISTVAN WACHTEL  ZOX:[id number] DOB: 02/16/1981 DOA: 10/20/2023 PCP: Collins Dean, NP  Brief Narrative/Hospital Course: 43 y.o. male with history of ESRD on hemodialysis MWF, hypertension, prior stroke, history of seizures, lupus anticoagulant brought to the ER on 5/5 after patient had a seizure, encephalopathy. Neurology was consulted. Patient remains encephalopathic.  Had breakthrough seizure 5/9 while on HD, and moved to PCU and found to have new strokes in MRI  The patient has been seen by neurology and nephrology. EEG is suggestive of moderate diffuse encephalopathy.  Sharp transients were noted in the right frontal region. Per neurology ADDED ON ltm eeg for further evaluation. MRI on 5/6 showed no evidence of acute intracranial abnormality, and multiple remote infarcts.  PTOT following  Subjective Patient seen examined this morning Is alert to self current place Pleasant, not in distress for exam, Overnight patient is afebrile BP stable  Labs show elevated BUN/creatinine stable CBC platelet count 73K  Assessment and Plan:  Acute right MCA patchy infarct as well as left thalamic and caudate head punctuate infarct Likely embolic, secondary to antiphospholipid syndrome with underdosed Eliquis . Neurology following, hide MRI brain 5/6 no acute infarct, MRI brain 5/11 multiple acute infarcts- SEE REPORT. MRA showed patency of major intracranial arteries. A1c 5.6.LDL: 75.  HDL: 26.  Carotid duplex no significant finding TTE:EF 70% mitral valve calcified, restricted possible rheumatic disease, moderate MS mild MR Noncompliant anticoagulation at home on 2.5 ELIQUIS  BID Now changed to 5 mg bid  Continue aggressive stroke risk modification PT OT and planning for skilled nursing facility   Acute encephalopathy Seizure disorder-Patient had a breakthrough seizure on 5/9.: Initially thought due to postictal state, but persisted.   Given his MRI findings, it seems most likely  that the encephalopathy  2/2 strokes involving the frontal region. Work up [employer]: WNL.HIV: Nonreactive.B12: 539 B1 nl, RPR nonreactive. B1: Within EEG notable for BL sharps as well as right frontal subclinical seizure. Discontinue Wernicke's dose thiamine . Continue delirium precaution, Depakote and Keppra  Continue supportive care.  Planning for placement.   ESRD on HD MWF: Secondary hyperparathyroidism Anemia of CKD Nephrology following continue HD planning today Cntinue IV onitor calcium  close and hemoglobin.   Hypertension Stable.Off amlodipine  and Coreg    Functional impairment Debility/deconditioning: Continue PT/OT -recommending skilled nursing facility  Class I Obesity w/ Body mass index is 31.9 kg/m.: Will benefit with PCP follow-up, weight loss,healthy lifestyle and outpatient sleep eval if not done.  DVT prophylaxis:  Code Status:   Code Status: Full Code Family Communication: plan of care discussed with patient at bedside. Patient status is: Remains hospitalized because of severity of illness Level of care: Progressive   Dispo: The patient is from: home. Unmarried. Next of kin is [name] per him.            Anticipated disposition: snf Objective: Vitals last 24 hrs: Vitals:   10/30/23 0358 10/30/23 0729 10/30/23 1102 10/30/23 1148  BP: (!) 126/92 (!) 146/99 (!) 171/99 (!) 177/86  Pulse: 63 70 66 64  Resp: 18 20 20 10   Temp: 98 F (36.7 C) 98 F (36.7 C) 97.8 F (36.6 C)   TempSrc: Oral Oral Oral   SpO2: 94% 97% 96%   Weight:      Height:        Physical Examination: General exam: AAOX2, PLEASANT HEENT:Oral mucosa moist, Ear/Nose WNL grossly Respiratory system: Bilaterally  clear BS Cardiovascular system: S1 & S2 +. Gastrointestinal system: Abdomen soft ntnd bs+ Nervous System: Alert,  awake, oriented x 2 , follows commands but poor co-ordinaiton and weaker  left side Extremities: LE edema neg, warm extremities Skin: No  rashes,warm. MSK: Normal muscle bulk/tone.   Data Reviewed: I have personally reviewed following labs and imaging studies ( see epic result tab) CBC: Recent Labs  Lab 10/25/23 0646 10/26/23 0732 10/27/23 0711 10/28/23 0709 10/30/23 0547  WBC 10.8* 9.8 7.7 7.4 7.6  NEUTROABS 7.7  --   --   --   --   HGB 13.1 13.7 13.0 12.7* 13.3  HCT 38.2* 40.7 39.1 37.8* 39.5  MCV 91.2 90.8 92.7 92.2 91.6  PLT 126* 106* 104* 95* 73*   CMP: Recent Labs  Lab 10/25/23 0646 10/26/23 0732 10/27/23 0711 10/28/23 0709 10/29/23 0615 10/30/23 0547  NA 136 138 137 138 136 135  K 4.1 4.6 4.3 4.3 4.0 4.7  CL 95* 97* 95* 92* 92* 92*  CO2 22 21* 24 22 26 23   GLUCOSE 108* 84 95 77 92 78  BUN 55* 42* 62* 82* 50* 65*  CREATININE 17.39* 13.86* 18.16* 20.54* 15.60* 18.25*  CALCIUM  9.6 10.3 9.7 10.0 9.0 9.3  MG  --  2.4  --   --   --   --   PHOS 7.5* 7.8*  --   --   --   --    GFR: Estimated Creatinine Clearance: 6.5 mL/min (A) (by C-G formula based on SCr of 18.25 mg/dL (H)). No results for input(s): "AST", "ALT", "ALKPHOS", "BILITOT", "PROT", "ALBUMIN " in the last 168 hours.  No results for input(s): "LIPASE", "AMYLASE" in the last 168 hours.  Recent Labs  Lab 10/27/23 0711  AMMONIA 30   Coagulation Profile: No results for input(s): "INR", "PROTIME" in the last 168 hours. Unresulted Labs (From admission, onward)     Start     Ordered   10/30/23 0500  CBC  Daily,   R     Question:  Specimen collection method  Answer:  Lab=Lab collect   10/29/23 1650   10/30/23 0500  Basic metabolic panel with GFR  Daily,   R     Question:  Specimen collection method  Answer:  Lab=Lab collect   10/29/23 1650   10/30/23 0500  Valproic acid level  Daily,   R     Question:  Specimen collection method  Answer:  Lab=Lab collect   10/29/23 1650           Antimicrobials/Microbiology: Anti-infectives (From admission, onward)    None         Component Value Date/Time   SDES BLOOD RIGHT HAND 01/19/2023  0213   SPECREQUEST  01/19/2023 1610    BOTTLES DRAWN AEROBIC AND ANAEROBIC Blood Culture results may not be optimal due to an excessive volume of blood received in culture bottles   CULT  01/19/2023 0213    NO GROWTH 5 DAYS Performed at [facility], [address] [place], [place] [address]    REPTSTATUS 01/24/2023 FINAL 01/19/2023 4098     Medications reviewed:  Scheduled Meds:  apixaban   5 mg Oral BID   atorvastatin   40 mg Oral Daily   calcitRIOL   2.5 mcg Oral Q M,W,F-HD   cinacalcet   150 mg Oral Q M,W,F-HD   divalproex  1,000 mg Oral Q12H   feeding supplement (NEPRO CARB STEADY)  237 mL Oral BID BM   levETIRAcetam   500 mg Oral BID   levETIRAcetam   500 mg Oral Q M,W,F-HD   multivitamin  1 tablet Oral  QHS   sevelamer  carbonate  3,200 mg Oral TID WC   sodium chloride  flush  10-40 mL Intracatheter Q12H   Continuous Infusions:  Lesa Rape, MD [facility] 10/30/2023, 1:18 PM

## 2023-10-30 NOTE — Progress Notes (Signed)
 STROKE TEAM PROGRESS NOTE   SUBJECTIVE (INTERVAL HISTORY) No family is at the bedside.  Pt lying in bed, no acute event overnight and no neuro change. Depakote level now 83.    OBJECTIVE Temp:  [97.8 F (36.6 C)-98.3 F (36.8 C)] 98.3 F (36.8 C) (05/14 1524) Pulse Rate:  [63-78] 72 (05/14 1524) Cardiac Rhythm: Normal sinus rhythm (05/14 0700) Resp:  [10-20] 20 (05/14 1524) BP: (126-177)/(86-111) 146/109 (05/14 1524) SpO2:  [93 %-100 %] 93 % (05/14 1524)  No results for input(s): "GLUCAP" in the last 168 hours.  Recent Labs  Lab 10/25/23 0646 10/26/23 0732 10/27/23 0711 10/28/23 0709 10/29/23 0615 10/30/23 0547  NA 136 138 137 138 136 135  K 4.1 4.6 4.3 4.3 4.0 4.7  CL 95* 97* 95* 92* 92* 92*  CO2 22 21* 24 22 26 23   GLUCOSE 108* 84 95 77 92 78  BUN 55* 42* 62* 82* 50* 65*  CREATININE 17.39* 13.86* 18.16* 20.54* 15.60* 18.25*  CALCIUM  9.6 10.3 9.7 10.0 9.0 9.3  MG  --  2.4  --   --   --   --   PHOS 7.5* 7.8*  --   --   --   --    No results for input(s): "AST", "ALT", "ALKPHOS", "BILITOT", "PROT", "ALBUMIN " in the last 168 hours.  Recent Labs  Lab 10/25/23 0646 10/26/23 0732 10/27/23 0711 10/28/23 0709 10/30/23 0547  WBC 10.8* 9.8 7.7 7.4 7.6  NEUTROABS 7.7  --   --   --   --   HGB 13.1 13.7 13.0 12.7* 13.3  HCT 38.2* 40.7 39.1 37.8* 39.5  MCV 91.2 90.8 92.7 92.2 91.6  PLT 126* 106* 104* 95* 73*   No results for input(s): "CKTOTAL", "CKMB", "CKMBINDEX", "TROPONINI" in the last 168 hours. No results for input(s): "LABPROT", "INR" in the last 72 hours. No results for input(s): "COLORURINE", "LABSPEC", "PHURINE", "GLUCOSEU", "HGBUR", "BILIRUBINUR", "KETONESUR", "PROTEINUR", "UROBILINOGEN", "NITRITE", "LEUKOCYTESUR" in the last 72 hours.  Invalid input(s): "APPERANCEUR"     Component Value Date/Time   CHOL 140 10/28/2023 0709   CHOL 194 09/27/2020 1140   TRIG 193 (H) 10/28/2023 0709   HDL 26 (L) 10/28/2023 0709   HDL 56 09/27/2020 1140   CHOLHDL 5.4  10/28/2023 0709   VLDL 39 10/28/2023 0709   LDLCALC 75 10/28/2023 0709   LDLCALC 114 (H) 09/27/2020 1140   Lab Results  Component Value Date   HGBA1C 5.6 10/27/2023      Component Value Date/Time   LABOPIA NONE DETECTED 02/15/2009 1435   COCAINSCRNUR NONE DETECTED 02/15/2009 1435   LABBENZ NONE DETECTED 02/15/2009 1435   AMPHETMU NONE DETECTED 02/15/2009 1435   THCU NONE DETECTED 02/15/2009 1435   LABBARB  02/15/2009 1435    NONE DETECTED        DRUG SCREEN FOR MEDICAL PURPOSES ONLY.  IF CONFIRMATION IS NEEDED FOR ANY PURPOSE, NOTIFY LAB WITHIN 5 DAYS.        LOWEST DETECTABLE LIMITS FOR URINE DRUG SCREEN Drug Class       Cutoff (ng/mL) Amphetamine      1000 Barbiturate      200 Benzodiazepine   200 Tricyclics       300 Opiates          300 Cocaine          300 THC              50    No results for input(s): "ETH" in the last 168  hours.  I have personally reviewed the radiological images below and agree with the radiology interpretations.  VAS US  CAROTID Result Date: 10/29/2023 Carotid Arterial Duplex Study Patient Name:  NEELY BESTER  Date of Exam:   10/29/2023 Medical Rec #: [id number]       Accession #:    [id number] Date of Birth: 1980/07/17      Patient Gender: M Patient Age:   43 years Exam Location:  [facility] Procedure:      VAS US  CAROTID Referring Phys: Jeanella Milan [facility] --------------------------------------------------------------------------------  Indications:       CVA. Risk Factors:      Hypertension. Comparison Study:  01/28/2023 - Right Carotid: Velocities in the right ICA are                    consistent with a 1-39% stenosis.                     Left Carotid: Velocities in the left ICA are consistent with                    a 1-39%                    stenosis.                     Vertebrals: Bilateral vertebral arteries demonstrate                    antegrade flow.                    Subclavians: Normal flow hemodynamics were seen in bilateral                     subclavian arteries. Performing Technologist: Lerry Ransom RVT  Examination Guidelines: A complete evaluation includes B-mode imaging, spectral Doppler, color Doppler, and power Doppler as needed of all accessible portions of each vessel. Bilateral testing is considered an integral part of a complete examination. Limited examinations for reoccurring indications may be performed as noted.  Right Carotid Findings: +----------+--------+--------+--------+-----------------------+--------+           PSV cm/sEDV cm/sStenosisPlaque Description     Comments +----------+--------+--------+--------+-----------------------+--------+ CCA Prox  57      15              smooth and heterogenous         +----------+--------+--------+--------+-----------------------+--------+ CCA Distal69      20              smooth and heterogenous         +----------+--------+--------+--------+-----------------------+--------+ ICA Prox  75      16                                     tortuous +----------+--------+--------+--------+-----------------------+--------+ ICA Mid   57      20                                     tortuous +----------+--------+--------+--------+-----------------------+--------+ ICA Distal53      23                                     tortuous +----------+--------+--------+--------+-----------------------+--------+  ECA       53      12                                              +----------+--------+--------+--------+-----------------------+--------+ +----------+--------+-------+--------+-------------------+           PSV cm/sEDV cmsDescribeArm Pressure (mmHG) +----------+--------+-------+--------+-------------------+ [id number]                                         +----------+--------+-------+--------+-------------------+ +---------+--------+--+--------+--+---------+ VertebralPSV cm/s46EDV cm/s20Antegrade  +---------+--------+--+--------+--+---------+  Left Carotid Findings: +----------+--------+--------+--------+-----------------------+--------+           PSV cm/sEDV cm/sStenosisPlaque Description     Comments +----------+--------+--------+--------+-----------------------+--------+ CCA Prox  83      19              smooth and heterogenous         +----------+--------+--------+--------+-----------------------+--------+ CCA Distal77      18              smooth and heterogenous         +----------+--------+--------+--------+-----------------------+--------+ ICA Prox  73      35              smooth and heterogenous         +----------+--------+--------+--------+-----------------------+--------+ ICA Mid   58      21                                     tortuous +----------+--------+--------+--------+-----------------------+--------+ ICA Distal65      23                                     tortuous +----------+--------+--------+--------+-----------------------+--------+ ECA       47      10                                              +----------+--------+--------+--------+-----------------------+--------+ +----------+--------+--------+--------+-------------------+           PSV cm/sEDV cm/sDescribeArm Pressure (mmHG) +----------+--------+--------+--------+-------------------+ Subclavian102                                         +----------+--------+--------+--------+-------------------+ +---------+--------+--+--------+--+---------+ VertebralPSV cm/s45EDV cm/s12Antegrade +---------+--------+--+--------+--+---------+   Summary: Right Carotid: Velocities in the right ICA are consistent with a 1-39% stenosis. Left Carotid: Velocities in the left ICA are consistent with a 1-39% stenosis. Vertebrals: Bilateral vertebral arteries demonstrate antegrade flow. *See table(s) above for measurements and observations.     Preliminary    ECHOCARDIOGRAM COMPLETE BUBBLE  STUDY Result Date: 10/28/2023    ECHOCARDIOGRAM REPORT   Patient Name:   RAEL PHI Date of Exam: 10/28/2023 Medical Rec #:  [id number]      Height:       72.0 in Accession #:    [id number]     Weight:       235.2 lb Date of Birth:  22-Apr-1981     BSA:  2.283 m Patient Age:    42 years       BP:           131/90 mmHg Patient Gender: M              HR:           78 bpm. Exam Location:  Inpatient Procedure: 2D Echo (Both Spectral and Color Flow Doppler were utilized during            procedure). Indications:    stroke  History:        Patient has prior history of Echocardiogram examinations, most                 recent 01/19/2023. End stage renal disease. Lupus.; Risk                 Factors:Current Smoker and Hypertension.  Sonographer:    Dione Franks RDCS Referring Phys: [id number] Jilda Most MDALA-GAUSI IMPRESSIONS  1. Left ventricular ejection fraction, by estimation, is 65 to 70%. The left ventricle has normal function. The left ventricle has no regional wall motion abnormalities. There is moderate concentric left ventricular hypertrophy. Left ventricular diastolic parameters are consistent with Grade I diastolic dysfunction (impaired relaxation).  2. Right ventricular systolic function is normal. The right ventricular size is normal. Tricuspid regurgitation signal is inadequate for assessing PA pressure.  3. Left atrial size was mildly dilated.  4. The mitral valve is calcified and restricted, possible rheumatic disease. Mild mitral valve regurgitation. Moderate mitral stenosis with mean gradient 5 mmHg, MVA 1.28 cm^2 by VTI and 1.38 cm^2 by PHT.  5. The aortic valve is tricuspid. There is mild calcification of the aortic valve. Aortic valve regurgitation is not visualized. No aortic stenosis is present.  6. The inferior vena cava is normal in size with greater than 50% respiratory variability, suggesting right atrial pressure of 3 mmHg.  7. Bubble study was unsuccessful. FINDINGS  Left Ventricle:  Left ventricular ejection fraction, by estimation, is 65 to 70%. The left ventricle has normal function. The left ventricle has no regional wall motion abnormalities. The left ventricular internal cavity size was normal in size. There is  moderate concentric left ventricular hypertrophy. Left ventricular diastolic parameters are consistent with Grade I diastolic dysfunction (impaired relaxation). Right Ventricle: The right ventricular size is normal. No increase in right ventricular wall thickness. Right ventricular systolic function is normal. Tricuspid regurgitation signal is inadequate for assessing PA pressure. Left Atrium: Left atrial size was mildly dilated. Right Atrium: Right atrial size was normal in size. Pericardium: There is no evidence of pericardial effusion. Mitral Valve: The mitral valve is rheumatic. There is moderate calcification of the mitral valve leaflet(s). Moderately decreased mobility of the mitral valve leaflets. Mild to moderate mitral annular calcification. Mild mitral valve regurgitation. Moderate mitral valve stenosis. MV peak gradient, 12.3 mmHg. The mean mitral valve gradient is 5.0 mmHg. Tricuspid Valve: The tricuspid valve is normal in structure. Tricuspid valve regurgitation is not demonstrated. Aortic Valve: The aortic valve is tricuspid. There is mild calcification of the aortic valve. Aortic valve regurgitation is not visualized. No aortic stenosis is present. Pulmonic Valve: The pulmonic valve was normal in structure. Pulmonic valve regurgitation is not visualized. Aorta: The aortic root is normal in size and structure. Venous: The inferior vena cava is normal in size with greater than 50% respiratory variability, suggesting right atrial pressure of 3 mmHg. IAS/Shunts: Bubble study was unsuccessful. Agitated saline contrast was given intravenously to  evaluate for intracardiac shunting.  LEFT VENTRICLE PLAX 2D LVIDd:         3.50 cm LVIDs:         2.50 cm LV PW:         1.40 cm  LV IVS:        1.10 cm LVOT diam:     2.20 cm LV SV:         61 LV SV Index:   27 LVOT Area:     3.80 cm  RIGHT VENTRICLE             IVC RV Basal diam:  2.70 cm     IVC diam: 1.30 cm RV S prime:     12.80 cm/s TAPSE (M-mode): 0.8 cm LEFT ATRIUM             Index        RIGHT ATRIUM           Index LA diam:        4.10 cm 1.80 cm/m   RA Area:     14.10 cm LA Vol (A2C):   69.8 ml 30.58 ml/m  RA Volume:   30.20 ml  13.23 ml/m LA Vol (A4C):   73.1 ml 32.02 ml/m LA Biplane Vol: 75.7 ml 33.16 ml/m  AORTIC VALVE LVOT Vmax:   129.00 cm/s LVOT Vmean:  83.600 cm/s LVOT VTI:    0.160 m  AORTA Ao Root diam: 3.50 cm Ao Asc diam:  3.40 cm MITRAL VALVE MV Area (PHT): 1.38 cm   SHUNTS MV Area VTI:   1.39 cm   Systemic VTI:  0.16 m MV Peak grad:  12.3 mmHg  Systemic Diam: 2.20 cm MV Mean grad:  5.0 mmHg MV Vmax:       1.76 m/s MV Vmean:      95.6 cm/s Dalton McleanMD Electronically signed by Archer Bear Signature Date/Time: 10/28/2023/6:30:21 PM    Final    MR ANGIO HEAD WO CONTRAST Result Date: 10/27/2023 CLINICAL DATA:  Stroke suspected EXAM: MRA HEAD WITHOUT CONTRAST TECHNIQUE: Angiographic images of the Circle of Willis were acquired using MRA technique without intravenous contrast. COMPARISON:  Brain MRI from earlier today FINDINGS: Severe motion artifact, nondiagnostic except showing patency of the carotid, vertebral, basilar, and major proximal branches. Cannot assess for stenosis or aneurysm. IMPRESSION: Severe motion artifact, non diagnostic other than documenting patency of the major intracranial arteries. Electronically Signed   By: Ronnette Coke M.D.   On: 10/27/2023 12:54   MR BRAIN WO CONTRAST Result Date: 10/27/2023 CLINICAL DATA:  Mental status change, unknown cause EXAM: MRI HEAD WITHOUT CONTRAST TECHNIQUE: Multiplanar, multiecho pulse sequences of the brain and surrounding structures were obtained without intravenous contrast. COMPARISON:  MRI Oct 22, 2023. FINDINGS: Brain: Multiple acute  infarcts in the right frontal, parietal and posterior temporal cortex as well as the adjacent subcortical white matter (right MCA territory). A few punctate acute infarcts in the left basal ganglia, thalamus and overlying white matter. No significant mass effect. Advanced chronic microvascular ischemic disease including T2/FLAIR hyperintensity in the white matter as well as remote left frontoparietal and bilateral occipital infarcts. Remote bilateral cerebellar infarcts. No evidence of acute hemorrhage, mass lesion, midline shift or hydrocephalus. Remote hemorrhage in the left occipital region associated with prior infarct. Vascular: Major arterial flow voids are maintained at the skull base. Skull and upper cervical spine: Normal marrow signal. Sinuses/Orbits: Clear sinuses.  No acute orbital findings. IMPRESSION: 1. Multiple acute infarcts in the  right frontal, parietal and posterior temporal cortex as well as the adjacent subcortical white matter (potentially watershed or right MCA territory). 2. A few punctate acute infarcts in the left basal ganglia, thalamus and overlying white matter. 3. Advanced chronic microvascular ischemic disease, detailed above. Imaging results were communicated on 10/27/2023 at 11:34 am to provider Dr. Murvin Arthurs via telephone. Electronically Signed   By: Stevenson Elbe M.D.   On: 10/27/2023 11:37   Overnight EEG with video Result Date: 10/25/2023 Arleene Lack, MD     10/26/2023  9:28 AM Patient Name: IVES TARA MRN: [id number] Epilepsy Attending: Arleene Lack Referring Physician/Provider: Khaliqdina, Salman, MD Duration: 10/24/2023 1141 to 10/26/2023 0500 Patient history: 43 y.o. male with hx of ESRD on hemodialysis, hypertension, prior stroke, history of seizures, lupus anticoagulant on Eiquis was brought to the ER for seizure and altered mental status. EEG to evaluate for seizure. Level of alertness: Awake AEDs during EEG study: LEV Technical aspects: This EEG study was  done with scalp electrodes positioned according to the 10-20 International system of electrode placement. Electrical activity was reviewed with band pass filter of 1-70Hz , sensitivity of 7 uV/mm, display speed of 42mm/sec with a 60Hz  notched filter applied as appropriate. EEG data were recorded continuously and digitally stored.  Video monitoring was available and reviewed as appropriate. Description: EEG showed continuous generalized high amplitude sharply contoured and at times rhythmic 3 to 6 Hz theta-delta slowing. Sharp waves were noted in left and right fronto-temporal region. Hyperventilation and photic stimulation were not performed. One seizure without clinical signs was noted on 10/24/2023 at 1754. EEG showed rhythmic sharply contoured 4-5hz  theta slowing in right>left frontal region which then evolved into 2-3hz  spike and wave in right fronto-temporal region. This was followed by 5-6hz  high amplitude that slowing in right hemisphere which then also involved left hemisphere. Subsequently eeg evolved into 1-1.5hz  delta slowing. Duration of seizure was about 2 minutes. Parts of study was technically difficult due to significant electrode artifact. Between 10/24/2023 2200 to 10/25/2023 1428, EEG ws not interpretable due to electrode artifact. ABNORMALITY - Seizure without clinical signs, right frontal region - Sharp waves, left fronto-temporal region - Sharp waves, right fronto-temporal region - Continuous slow, generalized IMPRESSION: This technically difficult study showed one seizure without clinical signs on 10/24/2023 at 1754 arising from right frontal region, lasting about 2 minutes. Additionally there was independent epileptogenicity arising from left and right fronto-temporal region. Lastly there was moderate diffuse encephalopathy. Arleene Lack   MR BRAIN WO CONTRAST Result Date: 10/22/2023 CLINICAL DATA:  Neuro deficit, acute, stroke suspected EXAM: MRI HEAD WITHOUT CONTRAST TECHNIQUE: Multiplanar,  multiecho pulse sequences of the brain and surrounding structures were obtained without intravenous contrast. COMPARISON:  CT head May 4, 25. FINDINGS: Motion limited study.  Within this limitation: Brain: No acute infarction, hemorrhage, hydrocephalus, extra-axial collection or mass lesion. Remote bilateral cerebellar infarcts. Remote left frontoparietal and bilateral occipital infarcts. Advanced patchy and confluent T2/FLAIR hyperintensity in the white matter, compatible with chronic microvascular ischemic disease. Vascular: Major arterial flow voids are maintained at the skull base. Skull and upper cervical spine: Normal marrow signal. Sinuses/Orbits: Clear sinuses.  No acute orbital findings. IMPRESSION: 1. No evidence of acute intracranial abnormality. 2. Multiple remote infarcts and advanced chronic microvascular ischemic disease. Electronically Signed   By: Stevenson Elbe M.D.   On: 10/22/2023 19:57   Overnight EEG with video Result Date: 10/22/2023 Arleene Lack, MD     10/22/2023  9:52 AM Patient Name:  NAREN NEIDERHISER MRN: [id number] Epilepsy Attending: Arleene Lack Referring Physician/Provider: Arleene Lack, MD Duration: 10/21/2023 1032 to 10/22/2023 4098  Patient history:  43 y.o. male with history of ESRD on hemodialysis, hypertension, prior stroke, history of seizures, lupus anticoagulant was brought to the ER after patient had a seizure.  EEG to evaluate for seizure.  Level of alertness: Awake, asleep  AEDs during EEG study: None  Technical aspects: This EEG study was done with scalp electrodes positioned according to the 10-20 International system of electrode placement. Electrical activity was reviewed with band pass filter of 1-70Hz , sensitivity of 7 uV/mm, display speed of 16mm/sec with a 60Hz  notched filter applied as appropriate. EEG data were recorded continuously and digitally stored.  Video monitoring was available and reviewed as appropriate.  Description: During awake state, no  clear posterior dominant rhythm was seen.  Sleep was characterized by sleep spindles (13 to 15 Hz), maximal frontocentral region. EEG showed continuous generalized and at times rhythmic 3 to 6 Hz theta-delta slowing.   Hyperventilation and photic stimulation were not performed.    EEG was disconnected between 10/21/2023 1057 to 2329 for dialysis and imaging.  ABNORMALITY - Continuous slow, generalized  IMPRESSION: This study is suggestive of moderate diffuse encephalopathy. No seizures were noted.  Arleene Lack   EEG adult Result Date: 10/21/2023 Arleene Lack, MD     10/21/2023 11:47 AM Patient Name: ZACHAI QUATTRONE MRN: [id number] Epilepsy Attending: Arleene Lack Referring Physician/Provider: Angelene Kelly, MD Date:  10/21/2023 Duration: 23.18 mins Patient history:  43 y.o. male with history of ESRD on hemodialysis, hypertension, prior stroke, history of seizures, lupus anticoagulant was brought to the ER after patient had a seizure.  EEG to evaluate for seizure. Level of alertness: Awake, asleep AEDs during EEG study: None Technical aspects: This EEG study was done with scalp electrodes positioned according to the 10-20 International system of electrode placement. Electrical activity was reviewed with band pass filter of 1-70Hz , sensitivity of 7 uV/mm, display speed of 76mm/sec with a 60Hz  notched filter applied as appropriate. EEG data were recorded continuously and digitally stored.  Video monitoring was available and reviewed as appropriate. Description: During awake state, no clear posterior dominant rhythm was seen.  Sleep was characterized by sleep spindles (13 to 15 Hz), maximal frontocentral region.  EEG showed continuous generalized and at times rhythmic 3 to 6 Hz theta-delta slowing.  Sharp transients were noted in right frontal region. Hyperventilation and photic stimulation were not performed.   ABNORMALITY - Continuous slow, generalized IMPRESSION: This study is suggestive of moderate  diffuse encephalopathy.  Sharp transients were noted in the right frontal region.  Because EEG showed rhythmic slowing as well as sharp transients, would recommend long-term EEG monitoring for further evaluation. Arleene Lack   CT Head Wo Contrast Result Date: 10/20/2023 CLINICAL DATA:  Seizure disorder, or altered mental status EXAM: CT HEAD WITHOUT CONTRAST TECHNIQUE: Contiguous axial images were obtained from the base of the skull through the vertex without intravenous contrast. RADIATION DOSE REDUCTION: This exam was performed according to the departmental dose-optimization program which includes automated exposure control, adjustment of the mA and/or kV according to patient size and/or use of iterative reconstruction technique. COMPARISON:  CT head 08/01/2023 FINDINGS: Brain: No intracranial hemorrhage, mass effect, or evidence of acute infarct. No hydrocephalus. No extra-axial fluid collection. Age-commensurate cerebral atrophy and chronic small vessel ischemic disease. Chronic left occipital, left insular, left parietal, left cerebellar, and right frontal infarcts.  Vascular: No hyperdense vessel. Intracranial arterial calcification. Skull: No fracture or focal lesion. Sinuses/Orbits: No acute finding. Other: None. IMPRESSION: 1. No acute intracranial abnormality.  Multiple chronic infarcts. Electronically Signed   By: Rozell Cornet M.D.   On: 10/20/2023 20:24     PHYSICAL EXAM  Temp:  [97.8 F (36.6 C)-98.3 F (36.8 C)] 98.3 F (36.8 C) (05/14 1524) Pulse Rate:  [63-78] 72 (05/14 1524) Resp:  [10-20] 20 (05/14 1524) BP: (126-177)/(86-111) 146/109 (05/14 1524) SpO2:  [93 %-100 %] 93 % (05/14 1524)  General - Well nourished, well developed, in no apparent distress.  Ophthalmologic - fundi not visualized due to noncooperation.  Cardiovascular - Regular rhythm and rate.  Neuro - awake, alert, eyes open but psychomotor slowing, orientated to place and self and age, but not to time. No  aphasia but very limited speech output, with mild dysarthria, slow in response with questions or commands, but following all simple commands. Able to name 2/3 and repeat. No gaze palsy, tracking bilaterally, but poor vision with optic aprexia, seems to have right hemianopia. No facial droop. Tongue midline. LUE 4/5 and RUE 5/5, BLE 3/5. Sensation stated different bilaterally but can not tell which one abnormal or worsened sensation, b/l FTN no ataxia but with past pointing and optic aprexia, gait not tested.     ASSESSMENT/PLAN Mr. DARELLE ANELLO is a 43 y.o. male with history of hypertension, ESRD on hemodialysis, seizure, antiphospholipid syndrome, stroke admitted for seizure and altered mental status.   Stroke:  right MCA patchy infarcts as well as left thalamic and caudate head punctate infarcts, embolic likely secondary to antiphospholipid syndrome with underdosed Eliquis  CT no acute abnormality MRI brain 10/22/2023 no acute infarct MRI brain 10/27/2023 multiple acute infarcts in the right frontal, parietal and temporal cortex as well as the adjacent subcortical white matter.  A few punctate acute infarct in the left BG, sinus and overlying bladder matter. MRA nondiagnostic, would not change management Carotid Doppler unremarkable 2D Echo EF 65 to 70%, mitral valve calcified and restricted, possible rheumatic disease, moderate mitral stenosis, mild mitral regurgitation. LDL 75 HgbA1c 5.6 Eliquis  for VTE prophylaxis Not compliant with anticoagulation at home but on Eliquis  2.5 twice daily this admission until 10/27/2023, now on Eliquis  5 mg twice daily.  Ongoing aggressive stroke risk factor management Therapy recommendations: SNF Disposition: Pending  History of stroke SLE with antiphospholipid syndrome 01/2023 admitted for left PCA infarct. MRI unremarkable. Carotid Doppler unremarkable. EF 55 to 60%. LDL 105, A1c 5.2. Discharged on Eliquis  and Lipitor 40.  03/2023 admitted for bilateral  scattered punctate embolic shower.  CT showed old left parietal, PCA and cerebellar infarcts.  Discharged on Coumadin  with INR goal 2.5-3.5. Apparently patient was switched back to Eliquis  prior to current admission but only on 2.5 mg twice daily.  Seizure History of seizure Patient has history of complex partial seizure, known by neurology service Being seen several times in the past for seizure in clinic and hospital Home medication Keppra  500 twice daily. Questionable compliance Admitted 03/2023 for seizure and aspiration pneumonitis.  Received Ativan  and Keppra  load. EEG showed moderate to severe diffuse encephalopathy, no seizures or epileptiform discharges. Continued Keppra  This time also admitted for seizure and altered mental status.  LTM EEG showed right frontal subclinical seizure.  Added Depakote to Keppra  Continue Keppra  500 twice daily Depakote level today 52->83-> pending increase Depakote 500 Q8 ->1000 Q12 Ammonia level 30-> pending  Hypertension Stable but soft Long term BP goal normotensive  Hyperlipidemia Home meds: Lipitor 40 LDL 75, goal < 70 Now on Lipitor 40 Continue statin at discharge  Other Stroke Risk Factors ESRD on hemodialysis Obesity, Body mass index is 31.9 kg/m.   Other Active Problems Medication noncompliance Thrombocytopenia, platelet 126--106--104--95--73, need close monitoring (his platelet was low in April and early May without depakote, so likely not depakote related)   Hospital day # 9   Consuelo Denmark, MD PhD Stroke Neurology 10/30/2023 3:25 PM    To contact Stroke Continuity provider, please refer to [email or web address]. After hours, contact General Neurology

## 2023-10-30 NOTE — Progress Notes (Signed)
 [facility] Progress Note   Subjective:   Patient seen in room today. More reponsive than previously. Plan for HD today 5/14. No reported dyspnea. Plan is  for SNF. No reported dyspnea. BP higher today, but he needs HD. Monitor post HD BP.   Objective Vitals:   10/30/23 0358 10/30/23 0729 10/30/23 1102 10/30/23 1148  BP: (!) 126/92 (!) 146/99 (!) 171/99 (!) 177/86  Pulse: 63 70 66 64  Resp: 18 20 20 10   Temp: 98 F (36.7 C) 98 F (36.7 C) 97.8 F (36.6 C)   TempSrc: Oral Oral Oral   SpO2: 94% 97% 96%   Weight:      Height:       Physical Exam General: Alert, more responsive today Heart: RRR, no murmurs or gallops Lungs: CTA anteriorly  Abdomen: soft, non-tender, non-distended Extremities: trace LE edema Dialysis Access: R [facility]  Additional Objective Labs: Basic Metabolic Panel: Recent Labs  Lab 10/25/23 0646 10/26/23 0732 10/27/23 0711 10/28/23 0709 10/29/23 0615 10/30/23 0547  NA 136 138   < > 138 136 135  K 4.1 4.6   < > 4.3 4.0 4.7  CL 95* 97*   < > 92* 92* 92*  CO2 22 21*   < > 22 26 23   GLUCOSE 108* 84   < > 77 92 78  BUN 55* 42*   < > 82* 50* 65*  CREATININE 17.39* 13.86*   < > 20.54* 15.60* 18.25*  CALCIUM  9.6 10.3   < > 10.0 9.0 9.3  PHOS 7.5* 7.8*  --   --   --   --    < > = values in this interval not displayed.   Liver Function Tests: No results for input(s): "AST", "ALT", "ALKPHOS", "BILITOT", "PROT", "ALBUMIN " in the last 168 hours. No results for input(s): "LIPASE", "AMYLASE" in the last 168 hours. CBC: Recent Labs  Lab 10/25/23 0646 10/26/23 0732 10/27/23 0711 10/28/23 0709 10/30/23 0547  WBC 10.8* 9.8 7.7 7.4 7.6  NEUTROABS 7.7  --   --   --   --   HGB 13.1 13.7 13.0 12.7* 13.3  HCT 38.2* 40.7 39.1 37.8* 39.5  MCV 91.2 90.8 92.7 92.2 91.6  PLT 126* 106* 104* 95* 73*   Blood Culture    Component Value Date/Time   SDES BLOOD RIGHT HAND 01/19/2023 0213   SPECREQUEST  01/19/2023 5784    BOTTLES DRAWN AEROBIC AND  ANAEROBIC Blood Culture results may not be optimal due to an excessive volume of blood received in culture bottles   CULT  01/19/2023 0213    NO GROWTH 5 DAYS Performed at [facility], [address], [place], [place] [address]    REPTSTATUS 01/24/2023 FINAL 01/19/2023 5284     Medications:   apixaban   5 mg Oral BID   atorvastatin   40 mg Oral Daily   calcitRIOL   2.5 mcg Oral Q M,W,F-HD   cinacalcet   150 mg Oral Q M,W,F-HD   divalproex  1,000 mg Oral Q12H   feeding supplement (NEPRO CARB STEADY)  237 mL Oral BID BM   levETIRAcetam   500 mg Oral BID   levETIRAcetam   500 mg Oral Q M,W,F-HD   multivitamin  1 tablet Oral QHS   sevelamer  carbonate  3,200 mg Oral TID WC   sodium chloride  flush  10-40 mL Intracatheter Q12H   Dialysis Orders: MWF [facility] 4h   B400   110.5kg  RIJ TDC   Heparin  none Mircera: none Calcitriol  2.5mcg  PO qHD - last dose 10/19/23 Sensipar  150mg  with HD - last dose 10/19/23  Assessment/Plan: Seizure disorder - Neurology following; EEG suggestive of moderate diffuse encephalopathy, no seizures noted and MRI brain no acute changes. Had seizures on HD 5/09. Is on keppra , depakote and vimpat now. Per neuro/ pmd.  Acute encephalopathy - doing better, still some deficits per neurology. CT clear, and EEG showing moderate diffuse encephalopathy. MRI 10/27/23 showing Multiple acute infarcts in the right frontal, parietal and posterior temporal cortex as well as the adjacent subcortical white matter and punctate acute infarcts in the left basal ganglia, thalamus and overlying white matter  ESRD -  on HD MWF. HD 10/30/23.  HTN/ volume  - bp's dropped so norvasc  and coreg  were dc'd. Still 4-5kg under, min UF goal.  Anemia of CKD - Current Hgb 13.3; at goal. No Fe/ESA is indicated at this time. Secondary hyperparathyroidism -  cont po vdra and sensipar  and binder Nutrition - Renal diet with fluid restriction  Hersey Lorenzo, NP-C 10/30/2023, 12:43 PM  [facility]

## 2023-10-30 NOTE — Plan of Care (Signed)
   Problem: Activity: Goal: Risk for activity intolerance will decrease Outcome: Progressing   Problem: Nutrition: Goal: Adequate nutrition will be maintained Outcome: Progressing

## 2023-10-30 NOTE — Progress Notes (Signed)
 Physical Therapy Treatment Patient Details Name: [name] [name] MRN: [id number] DOB: 23-Feb-1981 Today's Date: 10/30/2023   History of Present Illness Patient is a 43 year old male with witnessed seizure at dialysis on 10/21/2023. Patient leaving AMA, and returning a few hours later via [facility]. CT clear, and EEG showing moderate diffuse encephalopathy. MRI 10/27/23 showing Multiple acute infarcts in the right frontal, parietal and posterior temporal cortex as well as the adjacent subcortical white matter and punctate acute infarcts in the left basal ganglia, thalamus and overlying white matter. PMH: ESRD on HD, antiphospholipid syndrome, HTN, seizure, stroke.    PT Comments  Pt is progressing towards goals. Pt was able to progress gait this session at Min to Mod A for 40 ft of gait with RW. Pt continues with difficulty initiating tasks requiring Min A for bed mobility and sit to stand. Due to pt current functional status, home set up and available assistance at home recommending skilled physical therapy services < 3 hours/day in order to address strength, balance and functional mobility to decrease risk for falls, injury, immobility, skin break down and re-hospitalization.     If plan is discharge home, recommend the following: A lot of help with walking and/or transfers;Assistance with cooking/housework;Supervision due to cognitive status;Help with stairs or ramp for entrance   Can travel by private vehicle     No  Equipment Recommendations  Rolling walker (2 wheels);BSC/3in1       Precautions / Restrictions Precautions Precautions: Fall Recall of Precautions/Restrictions: Intact Restrictions Weight Bearing Restrictions Per Provider Order: No     Mobility  Bed Mobility Overal bed mobility: Needs Assistance Bed Mobility: Supine to Sit, Sit to Supine     Supine to sit: Min assist Sit to supine: Min assist   General bed mobility comments: intermittent assistance for LLE support, extra  time for initiation and requires tactile cues and physical movement to start initiating    Transfers Overall transfer level: Needs assistance Equipment used: Rolling walker (2 wheels) Transfers: Sit to/from Stand Sit to Stand: Min assist           General transfer comment: Min A from EOB with physical cues to begin initiating movement. Verbal cues for hand placement.    Ambulation/Gait Ambulation/Gait assistance: Min assist, Mod assist Gait Distance (Feet): 40 Feet Assistive device: Rolling walker (2 wheels) Gait Pattern/deviations: Step-through pattern, Decreased stride length, Shuffle Gait velocity: decreased Gait velocity interpretation: <1.31 ft/sec, indicative of household ambulator   General Gait Details: very short partial step through shuffling gait pattern. Mild improvement with constant verbal cues for "big steps". Min A for balance throughout. No significant knee buckling. Increased need for support with turns. Mod A for assist navigatinRW.    Modified Rankin (Stroke Patients Only) Modified Rankin (Stroke Patients Only) Pre-Morbid Rankin Score: Slight disability Modified Rankin: Moderately severe disability     Balance Overall balance assessment: Needs assistance Sitting-balance support: Feet supported Sitting balance-Leahy Scale: Fair     Standing balance support: Bilateral upper extremity supported, Reliant on assistive device for balance Standing balance-Leahy Scale: Poor Standing balance comment: external support required in addition to rolling walker        Communication Communication Communication: Impaired Factors Affecting Communication: Reduced clarity of speech  Cognition Arousal: Alert Behavior During Therapy: Flat affect   PT - Cognitive impairments: Orientation, Initiation   Orientation impairments: Place, Time, Situation     PT - Cognition Comments: slow to respond at times. increased time for motor planning, cues for initiation  and  sequencing Following commands: Impaired Following commands impaired: Follows one step commands inconsistently, Follows one step commands with increased time    Cueing Cueing Techniques: Verbal cues, Tactile cues, Visual cues, Gestural cues     General Comments General comments (skin integrity, edema, etc.): No noted cardiac/respiratory distress during session      Pertinent Vitals/Pain Pain Assessment Pain Assessment: No/denies pain Pain Intervention(s): Monitored during session     PT Goals (current goals can now be found in the care plan section) Acute Rehab PT Goals Patient Stated Goal: none stated PT Goal Formulation: Patient unable to participate in goal setting Time For Goal Achievement: 11/12/23 Potential to Achieve Goals: Fair Progress towards PT goals: Progressing toward goals    Frequency    Min 2X/week      PT Plan  Continue with current POC        AM-PAC PT "6 Clicks" Mobility   Outcome Measure  Help needed turning from your back to your side while in a flat bed without using bedrails?: A Little Help needed moving from lying on your back to sitting on the side of a flat bed without using bedrails?: A Little Help needed moving to and from a bed to a chair (including a wheelchair)?: A Little Help needed standing up from a chair using your arms (e.g., wheelchair or bedside chair)?: A Little Help needed to walk in hospital room?: A Lot Help needed climbing 3-5 steps with a railing? : Total 6 Click Score: 15    End of Session Equipment Utilized During Treatment: Gait belt Activity Tolerance: Patient limited by fatigue;Patient tolerated treatment well Patient left: in bed;with call bell/phone within reach;with bed alarm set Nurse Communication: Mobility status PT Visit Diagnosis: Muscle weakness (generalized) (M62.81);Unsteadiness on feet (R26.81)     Time: 1001-1016 PT Time Calculation (min) (ACUTE ONLY): 15 min  Charges:    $Therapeutic Activity:  8-22 mins PT General Charges $$ ACUTE PT VISIT: 1 Visit                     Sloan Duncans, DPT, CLT  Acute Rehabilitation Services Office: [phone] (Secure chat preferred)    [name] [name] 10/30/2023, 11:55 AM

## 2023-10-30 NOTE — TOC Progression Note (Signed)
 Transition of Care [facility]) - Progression Note    Patient Details  Name: [name] [name] MRN: [id number] Date of Birth: 05-Dec-1980  Transition of Care [facility]) CM/SW Contact  Tandy Fam, [place] Phone Number: 10/30/2023, 11:14 AM  Clinical Narrative:   CSW met with patient to provide bed offers. Patient said he would review to make a choice and discuss with his friend. CSW to check back with patient for SNF choice.    Expected Discharge Plan: Skilled Nursing Facility Barriers to Discharge: Continued Medical Work up, English as a second language teacher  Expected Discharge Plan and Services                                               Social Determinants of Health (SDOH) Interventions SDOH Screenings   Food Insecurity: No Food Insecurity (10/21/2023)  Housing: High Risk (10/21/2023)  Transportation Needs: No Transportation Needs (10/21/2023)  Utilities: Not At Risk (10/21/2023)  Alcohol Screen: Low Risk  (03/26/2023)  Depression (PHQ2-9): Low Risk  (03/26/2023)  Financial Resource Strain: Low Risk  (03/26/2023)  Physical Activity: Inactive (03/26/2023)  Social Connections: Moderately Isolated (10/21/2023)  Stress: No Stress Concern Present (03/26/2023)  Tobacco Use: High Risk (10/21/2023)  Health Literacy: Adequate Health Literacy (03/26/2023)    Readmission Risk Interventions    03/28/2023    2:57 PM 01/25/2023    3:40 PM  Readmission Risk Prevention Plan  Post Dischage Appt    Medication Screening    Transportation Screening Complete   Medication Review (RN Care Manager) Referral to Pharmacy   PCP or Specialist appointment within 3-5 days of discharge Complete   HRI or Home Care Consult Complete   SW Recovery Care/Counseling Consult Complete   Palliative Care Screening Not Applicable   Skilled Nursing Facility Not Applicable      Information is confidential and restricted. Go to Review Flowsheets to unlock data.

## 2023-10-31 DIAGNOSIS — I63411 Cerebral infarction due to embolism of right middle cerebral artery: Secondary | ICD-10-CM | POA: Diagnosis not present

## 2023-10-31 DIAGNOSIS — D6861 Antiphospholipid syndrome: Secondary | ICD-10-CM | POA: Diagnosis not present

## 2023-10-31 DIAGNOSIS — T45516A Underdosing of anticoagulants, initial encounter: Secondary | ICD-10-CM | POA: Diagnosis not present

## 2023-10-31 DIAGNOSIS — G9340 Encephalopathy, unspecified: Secondary | ICD-10-CM | POA: Diagnosis not present

## 2023-10-31 DIAGNOSIS — I6381 Other cerebral infarction due to occlusion or stenosis of small artery: Secondary | ICD-10-CM | POA: Diagnosis not present

## 2023-10-31 LAB — CBC
HCT: 41.4 % (ref 39.0–52.0)
Hemoglobin: 14 g/dL (ref 13.0–17.0)
MCH: 30.3 pg (ref 26.0–34.0)
MCHC: 33.8 g/dL (ref 30.0–36.0)
MCV: 89.6 fL (ref 80.0–100.0)
Platelets: 72 10*3/uL — ABNORMAL LOW (ref 150–400)
RBC: 4.62 MIL/uL (ref 4.22–5.81)
RDW: 13.8 % (ref 11.5–15.5)
WBC: 6.7 10*3/uL (ref 4.0–10.5)
nRBC: 0 % (ref 0.0–0.2)

## 2023-10-31 LAB — VALPROIC ACID LEVEL: Valproic Acid Lvl: 62 ug/mL (ref 50–100)

## 2023-10-31 LAB — BASIC METABOLIC PANEL WITH GFR
Anion gap: 16 — ABNORMAL HIGH (ref 5–15)
BUN: 49 mg/dL — ABNORMAL HIGH (ref 6–20)
CO2: 26 mmol/L (ref 22–32)
Calcium: 9.1 mg/dL (ref 8.9–10.3)
Chloride: 92 mmol/L — ABNORMAL LOW (ref 98–111)
Creatinine, Ser: 13.87 mg/dL — ABNORMAL HIGH (ref 0.61–1.24)
GFR, Estimated: 4 mL/min — ABNORMAL LOW (ref >60)
Glucose, Bld: 83 mg/dL (ref 70–99)
Potassium: 3.9 mmol/L (ref 3.5–5.1)
Sodium: 134 mmol/L — ABNORMAL LOW (ref 135–145)

## 2023-10-31 LAB — AMMONIA: Ammonia: <13 umol/L (ref 9–35)

## 2023-10-31 MED ORDER — HEPARIN SODIUM (PORCINE) 1000 UNIT/ML IJ SOLN: 4000.0000 [IU] | Freq: Once | INTRAMUSCULAR | Status: DC

## 2023-10-31 MED ORDER — HEPARIN SODIUM (PORCINE) 1000 UNIT/ML IJ SOLN
INTRAMUSCULAR | Status: AC
Start: 2023-10-31 — End: ?
  Filled 2023-10-31: qty 2

## 2023-10-31 NOTE — Progress Notes (Signed)
   10/31/23 0911  Vitals  Temp 98.2 F (36.8 C)  Pulse Rate 70  Resp 13  BP 133/89  SpO2 100 %  O2 Device Room Air  Post Treatment  Dialyzer Clearance Clotted  Liters Processed 50.6  Fluid Removed (mL) 0.8 mL  Tolerated HD Treatment No (Comment)  Post-Hemodialysis Comments Pt moving neck constantly throughout treatment which causes occlusion of CVC chest HD catheter. This caused alarms which stops the blood pump-pt cartridge clotted again. MD [place]hertz notified and treatment d/c. Pt had 53 minutes of treatment remaining.    TX duration: Two hours and 7 minutes  Transported back to the room  Alert, without acute distress.  Hand-off given to patient's nurse.   Access used: Right HD chest catheter

## 2023-10-31 NOTE — Progress Notes (Signed)
 [facility] Progress Note   Subjective:   Patient seen in HD today. He is more alert today. Patient had HD today, but he was moving his neck often, causing the blood pump to stop, and it subsequently clotted the machine. He was able to get 2 hours and 7 minutes of treatment. Plan for HD 11/02/23  Objective Vitals:   10/31/23 0835 10/31/23 0850 10/31/23 0905 10/31/23 0911  BP: (!) 133/99 132/88 130/84 133/89  Pulse: 74 77 71 70  Resp: 12 15 12 13   Temp:    98.2 F (36.8 C)  TempSrc:      SpO2: 100% 100% 100% 100%  Weight:      Height:       Physical Exam General: Alert, more responsive today Heart: RRR, no murmurs or gallops Lungs: CTA anteriorly  Abdomen: soft, non-tender, non-distended Extremities: trace LE edema Dialysis Access: R [facility]  Additional Objective Labs: Basic Metabolic Panel: Recent Labs  Lab 10/25/23 0646 10/26/23 0732 10/27/23 0711 10/29/23 0615 10/30/23 0547 10/31/23 1005  NA 136 138   < > 136 135 134*  K 4.1 4.6   < > 4.0 4.7 3.9  CL 95* 97*   < > 92* 92* 92*  CO2 22 21*   < > 26 23 26   GLUCOSE 108* 84   < > 92 78 83  BUN 55* 42*   < > 50* 65* 49*  CREATININE 17.39* 13.86*   < > 15.60* 18.25* 13.87*  CALCIUM  9.6 10.3   < > 9.0 9.3 9.1  PHOS 7.5* 7.8*  --   --   --   --    < > = values in this interval not displayed.   Liver Function Tests: No results for input(s): "AST", "ALT", "ALKPHOS", "BILITOT", "PROT", "ALBUMIN " in the last 168 hours. No results for input(s): "LIPASE", "AMYLASE" in the last 168 hours. CBC: Recent Labs  Lab 10/25/23 0646 10/26/23 0732 10/27/23 0711 10/28/23 0709 10/30/23 0547 10/31/23 1005  WBC 10.8* 9.8 7.7 7.4 7.6 6.7  NEUTROABS 7.7  --   --   --   --   --   HGB 13.1 13.7 13.0 12.7* 13.3 14.0  HCT 38.2* 40.7 39.1 37.8* 39.5 41.4  MCV 91.2 90.8 92.7 92.2 91.6 89.6  PLT 126* 106* 104* 95* 73* 72*   Blood Culture    Component Value Date/Time   SDES BLOOD RIGHT HAND 01/19/2023 0213   SPECREQUEST   01/19/2023 7829    BOTTLES DRAWN AEROBIC AND ANAEROBIC Blood Culture results may not be optimal due to an excessive volume of blood received in culture bottles   CULT  01/19/2023 0213    NO GROWTH 5 DAYS Performed at [facility], [address], [place], [place] [address]    REPTSTATUS 01/24/2023 FINAL 01/19/2023 0865     Medications:   apixaban   5 mg Oral BID   atorvastatin   40 mg Oral Daily   calcitRIOL   2.5 mcg Oral Q M,W,F-HD   cinacalcet   150 mg Oral Q M,W,F-HD   divalproex  1,000 mg Oral Q12H   feeding supplement (NEPRO CARB STEADY)  237 mL Oral BID BM   heparin  sodium (porcine)  4,000 Units Intracatheter Once   levETIRAcetam   500 mg Oral BID   levETIRAcetam   500 mg Oral Q M,W,F-HD   multivitamin  1 tablet Oral QHS   sevelamer  carbonate  3,200 mg Oral TID WC   sodium chloride  flush  10-40 mL Intracatheter Q12H  Dialysis Orders: MWF [facility] 4h   B400   110.5kg  RIJ TDC   Heparin  none Mircera: none Calcitriol  2.5mcg PO qHD - last dose 10/19/23 Sensipar  150mg  with HD - last dose 10/19/23  Assessment/Plan: Seizure disorder - Neurology following; EEG suggestive of moderate diffuse encephalopathy, no seizures noted and MRI brain no acute changes. Had seizures on HD 5/09. Is on keppra , depakote and vimpat now. Per neuro/ pmd.  Acute encephalopathy - doing better, still some deficits per neurology. CT clear, and EEG showing moderate diffuse encephalopathy. MRI 10/27/23 showing Multiple acute infarcts in the right frontal, parietal and posterior temporal cortex as well as the adjacent subcortical white matter and punctate acute infarcts in the left basal ganglia, thalamus and overlying white matter  ESRD -  on HD MWF. HD 11/02/23.  HTN/ volume  - bp's dropped so norvasc  and coreg  were dc'd. Still 4-5kg under, min UF goal.  Anemia of CKD - Current Hgb 14; at goal. No Fe/ESA is indicated at this time. Secondary hyperparathyroidism -  cont po vdra and sensipar  and binder Nutrition -  Renal diet with fluid restriction  [name] Lorenzo, NP-C 10/31/2023, 12:22 PM  [facility]

## 2023-10-31 NOTE — Progress Notes (Signed)
 PROGRESS NOTE [name] [name]  WRU:[id number] DOB: December 16, 1980 DOA: 10/20/2023 PCP: Collins Dean, NP  Brief Narrative/Hospital Course: 43 y.o. male with history of ESRD on hemodialysis MWF, hypertension, prior stroke, history of seizures, lupus anticoagulant brought to the ER on 5/5 after patient had a seizure, encephalopathy. Neurology was consulted. Patient remains encephalopathic.  Had breakthrough seizure 5/9 while on HD, and moved to PCU and found to have new strokes in MRI  The patient has been seen by neurology and nephrology. EEG is suggestive of moderate diffuse encephalopathy.  Sharp transients were noted in the right frontal region. Per neurology ADDED ON ltm eeg for further evaluation. MRI on 5/6 showed no evidence of acute intracranial abnormality, and multiple remote infarcts.  PTOT following  Subjective Patient seen and examined  Got dialysis today but left without completion ofdue to pain in neck Alert awake oriented to baseline his friend at the bedside he thinks he is much better but not back to baseline yet   Assessment and Plan:  Acute right MCA patchy infarct as well as left thalamic and caudate head punctuate infarct Likely embolic, secondary to antiphospholipid syndrome with underdosed Eliquis . Neurology following, hide MRI brain 5/6 no acute infarct, MRI brain 5/11 multiple acute infarcts- SEE REPORT. MRA showed patency of major intracranial arteries. A1c 5.6.LDL: 75.  HDL: 26.  Carotid duplex no significant finding TTE:EF 70% mitral valve calcified, restricted possible rheumatic disease, moderate MS mild MR Noncompliant anticoagulation at home on 2.5 ELIQUIS  BID Now changed to 5 mg bid Now on Lipitor 40. Cont aggressive stroke risk modification , PTOT W/ Plan for SNF  Acute encephalopathy Seizure disorder-Patient had a breakthrough seizure on 5/9.: Initially thought due to postictal state, but persisted.   Given his MRI findings, it seems most likely that the  encephalopathy  2/2 strokes involving the frontal region. Work up [employer]: WNL.HIV: Nonreactive.B12: 539 B1 nl, RPR nonreactive. B1: Within EEG notable for BL sharps as well as right frontal subclinical seizure. Discontinue Wernicke's dose thiamine . Continue delirium precaution, Depakote (dose increased to 1000 q12 5/13) and CONT ON Keppra  DEPAKOTE LEVEL62. Neurology is following.  ESRD on HD MWF: Secondary hyperparathyroidism Anemia of CKD Nephrology following continue HD done this am Cntinue to monitor calcium  close and hemoglobin.   Hypertension Stable. Off amlodipine  and Coreg    Functional impairment Debility/deconditioning: Continue PT/OT -recommending skilled nursing facility  Class I Obesity w/ Body mass index is 31.9 kg/m.: Will benefit with PCP follow-up, weight loss,healthy lifestyle and outpatient sleep eval if not done.  DVT prophylaxis:  Code Status:   Code Status: Full Code Family Communication: plan of care discussed with patient at bedside. Patient status is: Remains hospitalized because of severity of illness Level of care: Progressive   Dispo: The patient is from: home. Unmarried. Next of kin is [name] per him.            Anticipated disposition: snf Objective: Vitals last 24 hrs: Vitals:   10/31/23 0850 10/31/23 0905 10/31/23 0911 10/31/23 1240  BP: 132/88 130/84 133/89 (!) 133/102  Pulse: 77 71 70 76  Resp: 15 12 13 13   Temp:   98.2 F (36.8 C) 97.7 F (36.5 C)  TempSrc:    Oral  SpO2: 100% 100% 100% 98%  Weight:      Height:        Physical Examination: General exam: aaox2, follows commands HEENT:Oral mucosa moist, Ear/Nose WNL grossly Respiratory system: Bilaterally clear  clear BS Cardiovascular system: S1 & S2 +.  Gastrointestinal system: Abdomen soft NTND BS+ Nervous System: Alert, awake follows commands and able to move extremities with some weakness coordination skills Extremities: LE edema neg, warm extremities Skin:  No rashes,warm. MSK: Normal muscle bulk/tone.   Data Reviewed: I have personally reviewed following labs and imaging studies ( see epic result tab) CBC: Recent Labs  Lab 10/25/23 0646 10/26/23 0732 10/27/23 0711 10/28/23 0709 10/30/23 0547 10/31/23 1005  WBC 10.8* 9.8 7.7 7.4 7.6 6.7  NEUTROABS 7.7  --   --   --   --   --   HGB 13.1 13.7 13.0 12.7* 13.3 14.0  HCT 38.2* 40.7 39.1 37.8* 39.5 41.4  MCV 91.2 90.8 92.7 92.2 91.6 89.6  PLT 126* 106* 104* 95* 73* 72*   CMP: Recent Labs  Lab 10/25/23 0646 10/26/23 0732 10/27/23 0711 10/28/23 0709 10/29/23 0615 10/30/23 0547 10/31/23 1005  NA 136 138 137 138 136 135 134*  K 4.1 4.6 4.3 4.3 4.0 4.7 3.9  CL 95* 97* 95* 92* 92* 92* 92*  CO2 22 21* 24 22 26 23 26   GLUCOSE 108* 84 95 77 92 78 83  BUN 55* 42* 62* 82* 50* 65* 49*  CREATININE 17.39* 13.86* 18.16* 20.54* 15.60* 18.25* 13.87*  CALCIUM  9.6 10.3 9.7 10.0 9.0 9.3 9.1  MG  --  2.4  --   --   --   --   --   PHOS 7.5* 7.8*  --   --   --   --   --    GFR: Estimated Creatinine Clearance: 8.6 mL/min (A) (by C-G formula based on SCr of 13.87 mg/dL (H)). No results for input(s): "AST", "ALT", "ALKPHOS", "BILITOT", "PROT", "ALBUMIN " in the last 168 hours.  No results for input(s): "LIPASE", "AMYLASE" in the last 168 hours.  Recent Labs  Lab 10/27/23 0711 10/31/23 1005  AMMONIA 30 <13   Coagulation Profile: No results for input(s): "INR", "PROTIME" in the last 168 hours. Unresulted Labs (From admission, onward)     Start     Ordered   10/30/23 0500  CBC  Daily,   R     Question:  Specimen collection method  Answer:  Lab=Lab collect   10/29/23 1650   10/30/23 0500  Basic metabolic panel with GFR  Daily,   R     Question:  Specimen collection method  Answer:  Lab=Lab collect   10/29/23 1650   10/30/23 0500  Valproic acid level  Daily,   R     Question:  Specimen collection method  Answer:  Lab=Lab collect   10/29/23 1650            Antimicrobials/Microbiology: Anti-infectives (From admission, onward)    None         Component Value Date/Time   SDES BLOOD RIGHT HAND 01/19/2023 0213   SPECREQUEST  01/19/2023 4540    BOTTLES DRAWN AEROBIC AND ANAEROBIC Blood Culture results may not be optimal due to an excessive volume of blood received in culture bottles   CULT  01/19/2023 0213    NO GROWTH 5 DAYS Performed at [facility], [address], [place], [place] [address]    REPTSTATUS 01/24/2023 FINAL 01/19/2023 1478     Medications reviewed:  Scheduled Meds:  apixaban   5 mg Oral BID   atorvastatin   40 mg Oral Daily   calcitRIOL   2.5 mcg Oral Q M,W,F-HD   cinacalcet   150 mg Oral Q M,W,F-HD   divalproex  1,000 mg Oral Q12H  feeding supplement (NEPRO CARB STEADY)  237 mL Oral BID BM   heparin  sodium (porcine)  4,000 Units Intracatheter Once   levETIRAcetam   500 mg Oral BID   levETIRAcetam   500 mg Oral Q M,W,F-HD   multivitamin  1 tablet Oral QHS   sevelamer  carbonate  3,200 mg Oral TID WC   sodium chloride  flush  10-40 mL Intracatheter Q12H   Continuous Infusions:  Lesa Rape, MD [facility] 10/31/2023, 12:56 PM

## 2023-10-31 NOTE — Progress Notes (Signed)
 STROKE TEAM PROGRESS NOTE   SUBJECTIVE (INTERVAL HISTORY) No family is at the bedside.  Pt lying in bed, no acute event overnight and no neuro change, still not orientated to time. Had HD today, depakote level 62, platelet stable and ammonia level much improved.    OBJECTIVE Temp:  [97.7 F (36.5 C)-98.3 F (36.8 C)] 97.7 F (36.5 C) (05/15 1240) Pulse Rate:  [70-84] 76 (05/15 1240) Cardiac Rhythm: Normal sinus rhythm;Heart block (05/15 1149) Resp:  [8-20] 13 (05/15 1240) BP: (130-163)/(84-121) 133/102 (05/15 1240) SpO2:  [93 %-100 %] 98 % (05/15 1240)  No results for input(s): "GLUCAP" in the last 168 hours.  Recent Labs  Lab 10/25/23 0646 10/26/23 0732 10/27/23 0711 10/28/23 0709 10/29/23 0615 10/30/23 0547 10/31/23 1005  NA 136 138 137 138 136 135 134*  K 4.1 4.6 4.3 4.3 4.0 4.7 3.9  CL 95* 97* 95* 92* 92* 92* 92*  CO2 22 21* 24 22 26 23 26   GLUCOSE 108* 84 95 77 92 78 83  BUN 55* 42* 62* 82* 50* 65* 49*  CREATININE 17.39* 13.86* 18.16* 20.54* 15.60* 18.25* 13.87*  CALCIUM  9.6 10.3 9.7 10.0 9.0 9.3 9.1  MG  --  2.4  --   --   --   --   --   PHOS 7.5* 7.8*  --   --   --   --   --    No results for input(s): "AST", "ALT", "ALKPHOS", "BILITOT", "PROT", "ALBUMIN " in the last 168 hours.  Recent Labs  Lab 10/25/23 0646 10/26/23 0732 10/27/23 0711 10/28/23 0709 10/30/23 0547 10/31/23 1005  WBC 10.8* 9.8 7.7 7.4 7.6 6.7  NEUTROABS 7.7  --   --   --   --   --   HGB 13.1 13.7 13.0 12.7* 13.3 14.0  HCT 38.2* 40.7 39.1 37.8* 39.5 41.4  MCV 91.2 90.8 92.7 92.2 91.6 89.6  PLT 126* 106* 104* 95* 73* 72*   No results for input(s): "CKTOTAL", "CKMB", "CKMBINDEX", "TROPONINI" in the last 168 hours. No results for input(s): "LABPROT", "INR" in the last 72 hours. No results for input(s): "COLORURINE", "LABSPEC", "PHURINE", "GLUCOSEU", "HGBUR", "BILIRUBINUR", "KETONESUR", "PROTEINUR", "UROBILINOGEN", "NITRITE", "LEUKOCYTESUR" in the last 72 hours.  Invalid input(s):  "APPERANCEUR"     Component Value Date/Time   CHOL 140 10/28/2023 0709   CHOL 194 09/27/2020 1140   TRIG 193 (H) 10/28/2023 0709   HDL 26 (L) 10/28/2023 0709   HDL 56 09/27/2020 1140   CHOLHDL 5.4 10/28/2023 0709   VLDL 39 10/28/2023 0709   LDLCALC 75 10/28/2023 0709   LDLCALC 114 (H) 09/27/2020 1140   Lab Results  Component Value Date   HGBA1C 5.6 10/27/2023      Component Value Date/Time   LABOPIA NONE DETECTED 02/15/2009 1435   COCAINSCRNUR NONE DETECTED 02/15/2009 1435   LABBENZ NONE DETECTED 02/15/2009 1435   AMPHETMU NONE DETECTED 02/15/2009 1435   THCU NONE DETECTED 02/15/2009 1435   LABBARB  02/15/2009 1435    NONE DETECTED        DRUG SCREEN FOR MEDICAL PURPOSES ONLY.  IF CONFIRMATION IS NEEDED FOR ANY PURPOSE, NOTIFY LAB WITHIN 5 DAYS.        LOWEST DETECTABLE LIMITS FOR URINE DRUG SCREEN Drug Class       Cutoff (ng/mL) Amphetamine      1000 Barbiturate      200 Benzodiazepine   200 Tricyclics       300 Opiates  300 Cocaine          300 THC              50    No results for input(s): "ETH" in the last 168 hours.  I have personally reviewed the radiological images below and agree with the radiology interpretations.  VAS US  CAROTID Result Date: 10/29/2023 Carotid Arterial Duplex Study Patient Name:  [name] [name]  Date of Exam:   10/29/2023 Medical Rec #: [id number]       Accession #:    [id number] Date of Birth: 1980-10-09      Patient Gender: M Patient Age:   43 years Exam Location:  [facility] Procedure:      VAS US  CAROTID Referring Phys: Jeanella Milan [facility] --------------------------------------------------------------------------------  Indications:       CVA. Risk Factors:      Hypertension. Comparison Study:  01/28/2023 - Right Carotid: Velocities in the right ICA are                    consistent with a 1-39% stenosis.                     Left Carotid: Velocities in the left ICA are consistent with                    a 1-39%                     stenosis.                     Vertebrals: Bilateral vertebral arteries demonstrate                    antegrade flow.                    Subclavians: Normal flow hemodynamics were seen in bilateral                    subclavian arteries. Performing Technologist: Lerry Ransom RVT  Examination Guidelines: A complete evaluation includes B-mode imaging, spectral Doppler, color Doppler, and power Doppler as needed of all accessible portions of each vessel. Bilateral testing is considered an integral part of a complete examination. Limited examinations for reoccurring indications may be performed as noted.  Right Carotid Findings: +----------+--------+--------+--------+-----------------------+--------+           PSV cm/sEDV cm/sStenosisPlaque Description     Comments +----------+--------+--------+--------+-----------------------+--------+ CCA Prox  57      15              smooth and heterogenous         +----------+--------+--------+--------+-----------------------+--------+ CCA Distal69      20              smooth and heterogenous         +----------+--------+--------+--------+-----------------------+--------+ ICA Prox  75      16                                     tortuous +----------+--------+--------+--------+-----------------------+--------+ ICA Mid   57      20                                     tortuous +----------+--------+--------+--------+-----------------------+--------+ ICA Distal53  23                                     tortuous +----------+--------+--------+--------+-----------------------+--------+ ECA       53      12                                              +----------+--------+--------+--------+-----------------------+--------+ +----------+--------+-------+--------+-------------------+           PSV cm/sEDV cmsDescribeArm Pressure (mmHG) +----------+--------+-------+--------+-------------------+ [id number]                                          +----------+--------+-------+--------+-------------------+ +---------+--------+--+--------+--+---------+ VertebralPSV cm/s46EDV cm/s20Antegrade +---------+--------+--+--------+--+---------+  Left Carotid Findings: +----------+--------+--------+--------+-----------------------+--------+           PSV cm/sEDV cm/sStenosisPlaque Description     Comments +----------+--------+--------+--------+-----------------------+--------+ CCA Prox  83      19              smooth and heterogenous         +----------+--------+--------+--------+-----------------------+--------+ CCA Distal77      18              smooth and heterogenous         +----------+--------+--------+--------+-----------------------+--------+ ICA Prox  73      35              smooth and heterogenous         +----------+--------+--------+--------+-----------------------+--------+ ICA Mid   58      21                                     tortuous +----------+--------+--------+--------+-----------------------+--------+ ICA Distal65      23                                     tortuous +----------+--------+--------+--------+-----------------------+--------+ ECA       47      10                                              +----------+--------+--------+--------+-----------------------+--------+ +----------+--------+--------+--------+-------------------+           PSV cm/sEDV cm/sDescribeArm Pressure (mmHG) +----------+--------+--------+--------+-------------------+ Subclavian102                                         +----------+--------+--------+--------+-------------------+ +---------+--------+--+--------+--+---------+ VertebralPSV cm/s45EDV cm/s12Antegrade +---------+--------+--+--------+--+---------+   Summary: Right Carotid: Velocities in the right ICA are consistent with a 1-39% stenosis. Left Carotid: Velocities in the left ICA are consistent with a 1-39% stenosis.  Vertebrals: Bilateral vertebral arteries demonstrate antegrade flow. *See table(s) above for measurements and observations.     Preliminary    ECHOCARDIOGRAM COMPLETE BUBBLE STUDY Result Date: 10/28/2023    ECHOCARDIOGRAM REPORT   Patient Name:   [name] [name] Date of Exam: 10/28/2023 Medical Rec #:  [id number]      Height:  72.0 in Accession #:    [id number]     Weight:       235.2 lb Date of Birth:  04-Feb-1981     BSA:          2.283 m Patient Age:    42 years       BP:           131/90 mmHg Patient Gender: M              HR:           78 bpm. Exam Location:  Inpatient Procedure: 2D Echo (Both Spectral and Color Flow Doppler were utilized during            procedure). Indications:    stroke  History:        Patient has prior history of Echocardiogram examinations, most                 recent 01/19/2023. End stage renal disease. Lupus.; Risk                 Factors:Current Smoker and Hypertension.  Sonographer:    Dione Franks RDCS Referring Phys: [id number] Jilda Most MDALA-GAUSI IMPRESSIONS  1. Left ventricular ejection fraction, by estimation, is 65 to 70%. The left ventricle has normal function. The left ventricle has no regional wall motion abnormalities. There is moderate concentric left ventricular hypertrophy. Left ventricular diastolic parameters are consistent with Grade I diastolic dysfunction (impaired relaxation).  2. Right ventricular systolic function is normal. The right ventricular size is normal. Tricuspid regurgitation signal is inadequate for assessing PA pressure.  3. Left atrial size was mildly dilated.  4. The mitral valve is calcified and restricted, possible rheumatic disease. Mild mitral valve regurgitation. Moderate mitral stenosis with mean gradient 5 mmHg, MVA 1.28 cm^2 by VTI and 1.38 cm^2 by PHT.  5. The aortic valve is tricuspid. There is mild calcification of the aortic valve. Aortic valve regurgitation is not visualized. No aortic stenosis is present.  6. The inferior  vena cava is normal in size with greater than 50% respiratory variability, suggesting right atrial pressure of 3 mmHg.  7. Bubble study was unsuccessful. FINDINGS  Left Ventricle: Left ventricular ejection fraction, by estimation, is 65 to 70%. The left ventricle has normal function. The left ventricle has no regional wall motion abnormalities. The left ventricular internal cavity size was normal in size. There is  moderate concentric left ventricular hypertrophy. Left ventricular diastolic parameters are consistent with Grade I diastolic dysfunction (impaired relaxation). Right Ventricle: The right ventricular size is normal. No increase in right ventricular wall thickness. Right ventricular systolic function is normal. Tricuspid regurgitation signal is inadequate for assessing PA pressure. Left Atrium: Left atrial size was mildly dilated. Right Atrium: Right atrial size was normal in size. Pericardium: There is no evidence of pericardial effusion. Mitral Valve: The mitral valve is rheumatic. There is moderate calcification of the mitral valve leaflet(s). Moderately decreased mobility of the mitral valve leaflets. Mild to moderate mitral annular calcification. Mild mitral valve regurgitation. Moderate mitral valve stenosis. MV peak gradient, 12.3 mmHg. The mean mitral valve gradient is 5.0 mmHg. Tricuspid Valve: The tricuspid valve is normal in structure. Tricuspid valve regurgitation is not demonstrated. Aortic Valve: The aortic valve is tricuspid. There is mild calcification of the aortic valve. Aortic valve regurgitation is not visualized. No aortic stenosis is present. Pulmonic Valve: The pulmonic valve was normal in structure. Pulmonic valve regurgitation is not visualized. Aorta: The aortic root  is normal in size and structure. Venous: The inferior vena cava is normal in size with greater than 50% respiratory variability, suggesting right atrial pressure of 3 mmHg. IAS/Shunts: Bubble study was unsuccessful.  Agitated saline contrast was given intravenously to evaluate for intracardiac shunting.  LEFT VENTRICLE PLAX 2D LVIDd:         3.50 cm LVIDs:         2.50 cm LV PW:         1.40 cm LV IVS:        1.10 cm LVOT diam:     2.20 cm LV SV:         61 LV SV Index:   27 LVOT Area:     3.80 cm  RIGHT VENTRICLE             IVC RV Basal diam:  2.70 cm     IVC diam: 1.30 cm RV S prime:     12.80 cm/s TAPSE (M-mode): 0.8 cm LEFT ATRIUM             Index        RIGHT ATRIUM           Index LA diam:        4.10 cm 1.80 cm/m   RA Area:     14.10 cm LA Vol (A2C):   69.8 ml 30.58 ml/m  RA Volume:   30.20 ml  13.23 ml/m LA Vol (A4C):   73.1 ml 32.02 ml/m LA Biplane Vol: 75.7 ml 33.16 ml/m  AORTIC VALVE LVOT Vmax:   129.00 cm/s LVOT Vmean:  83.600 cm/s LVOT VTI:    0.160 m  AORTA Ao Root diam: 3.50 cm Ao Asc diam:  3.40 cm MITRAL VALVE MV Area (PHT): 1.38 cm   SHUNTS MV Area VTI:   1.39 cm   Systemic VTI:  0.16 m MV Peak grad:  12.3 mmHg  Systemic Diam: 2.20 cm MV Mean grad:  5.0 mmHg MV Vmax:       1.76 m/s MV Vmean:      95.6 cm/s Dalton McleanMD Electronically signed by Archer Bear Signature Date/Time: 10/28/2023/6:30:21 PM    Final    MR ANGIO HEAD WO CONTRAST Result Date: 10/27/2023 CLINICAL DATA:  Stroke suspected EXAM: MRA HEAD WITHOUT CONTRAST TECHNIQUE: Angiographic images of the Circle of Willis were acquired using MRA technique without intravenous contrast. COMPARISON:  Brain MRI from earlier today FINDINGS: Severe motion artifact, nondiagnostic except showing patency of the carotid, vertebral, basilar, and major proximal branches. Cannot assess for stenosis or aneurysm. IMPRESSION: Severe motion artifact, non diagnostic other than documenting patency of the major intracranial arteries. Electronically Signed   By: Ronnette Coke M.D.   On: 10/27/2023 12:54   MR BRAIN WO CONTRAST Result Date: 10/27/2023 CLINICAL DATA:  Mental status change, unknown cause EXAM: MRI HEAD WITHOUT CONTRAST TECHNIQUE:  Multiplanar, multiecho pulse sequences of the brain and surrounding structures were obtained without intravenous contrast. COMPARISON:  MRI Oct 22, 2023. FINDINGS: Brain: Multiple acute infarcts in the right frontal, parietal and posterior temporal cortex as well as the adjacent subcortical white matter (right MCA territory). A few punctate acute infarcts in the left basal ganglia, thalamus and overlying white matter. No significant mass effect. Advanced chronic microvascular ischemic disease including T2/FLAIR hyperintensity in the white matter as well as remote left frontoparietal and bilateral occipital infarcts. Remote bilateral cerebellar infarcts. No evidence of acute hemorrhage, mass lesion, midline shift or hydrocephalus. Remote hemorrhage in the left  occipital region associated with prior infarct. Vascular: Major arterial flow voids are maintained at the skull base. Skull and upper cervical spine: Normal marrow signal. Sinuses/Orbits: Clear sinuses.  No acute orbital findings. IMPRESSION: 1. Multiple acute infarcts in the right frontal, parietal and posterior temporal cortex as well as the adjacent subcortical white matter (potentially watershed or right MCA territory). 2. A few punctate acute infarcts in the left basal ganglia, thalamus and overlying white matter. 3. Advanced chronic microvascular ischemic disease, detailed above. Imaging results were communicated on 10/27/2023 at 11:34 am to provider Dr. Murvin Arthurs via telephone. Electronically Signed   By: Stevenson Elbe M.D.   On: 10/27/2023 11:37   Overnight EEG with video Result Date: 10/25/2023 Arleene Lack, MD     10/26/2023  9:28 AM Patient Name: [name] [name] MRN: [id number] Epilepsy Attending: Arleene Lack Referring Physician/Provider: Khaliqdina, Salman, MD Duration: 10/24/2023 1141 to 10/26/2023 0500 Patient history: 43 y.o. male with hx of ESRD on hemodialysis, hypertension, prior stroke, history of seizures, lupus anticoagulant on  [place]iqu[place] was brought to the ER for seizure and altered mental status. EEG to evaluate for seizure. Level of alertness: Awake AEDs during EEG study: LEV Technical aspects: This EEG study was done with scalp electrodes positioned according to the 10-20 International system of electrode placement. Electrical activity was reviewed with band pass filter of 1-70Hz , sensitivity of 7 uV/mm, display speed of 45mm/sec with a 60Hz  notched filter applied as appropriate. EEG data were recorded continuously and digitally stored.  Video monitoring was available and reviewed as appropriate. Description: EEG showed continuous generalized high amplitude sharply contoured and at times rhythmic 3 to 6 Hz theta-delta slowing. Sharp waves were noted in left and right fronto-temporal region. Hyperventilation and photic stimulation were not performed. One seizure without clinical signs was noted on 10/24/2023 at 1754. EEG showed rhythmic sharply contoured 4-5hz  theta slowing in right>left frontal region which then evolved into 2-3hz  spike and wave in right fronto-temporal region. This was followed by 5-6hz  high amplitude that slowing in right hemisphere which then also involved left hemisphere. Subsequently eeg evolved into 1-1.5hz  delta slowing. Duration of seizure was about 2 minutes. Parts of study was technically difficult due to significant electrode artifact. Between 10/24/2023 2200 to 10/25/2023 1428, EEG ws not interpretable due to electrode artifact. ABNORMALITY - Seizure without clinical signs, right frontal region - Sharp waves, left fronto-temporal region - Sharp waves, right fronto-temporal region - Continuous slow, generalized IMPRESSION: This technically difficult study showed one seizure without clinical signs on 10/24/2023 at 1754 arising from right frontal region, lasting about 2 minutes. Additionally there was independent epileptogenicity arising from left and right fronto-temporal region. Lastly there was moderate diffuse  encephalopathy. Arleene Lack   MR BRAIN WO CONTRAST Result Date: 10/22/2023 CLINICAL DATA:  Neuro deficit, acute, stroke suspected EXAM: MRI HEAD WITHOUT CONTRAST TECHNIQUE: Multiplanar, multiecho pulse sequences of the brain and surrounding structures were obtained without intravenous contrast. COMPARISON:  CT head May 4, 25. FINDINGS: Motion limited study.  Within this limitation: Brain: No acute infarction, hemorrhage, hydrocephalus, extra-axial collection or mass lesion. Remote bilateral cerebellar infarcts. Remote left frontoparietal and bilateral occipital infarcts. Advanced patchy and confluent T2/FLAIR hyperintensity in the white matter, compatible with chronic microvascular ischemic disease. Vascular: Major arterial flow voids are maintained at the skull base. Skull and upper cervical spine: Normal marrow signal. Sinuses/Orbits: Clear sinuses.  No acute orbital findings. IMPRESSION: 1. No evidence of acute intracranial abnormality. 2. Multiple remote infarcts and advanced chronic  microvascular ischemic disease. Electronically Signed   By: Stevenson Elbe M.D.   On: 10/22/2023 19:57   Overnight EEG with video Result Date: 10/22/2023 Arleene Lack, MD     10/22/2023  9:52 AM Patient Name: [name] [name] MRN: [id number] Epilepsy Attending: Arleene Lack Referring Physician/Provider: Arleene Lack, MD Duration: 10/21/2023 1032 to 10/22/2023 4696  Patient history:  43 y.o. male with history of ESRD on hemodialysis, hypertension, prior stroke, history of seizures, lupus anticoagulant was brought to the ER after patient had a seizure.  EEG to evaluate for seizure.  Level of alertness: Awake, asleep  AEDs during EEG study: None  Technical aspects: This EEG study was done with scalp electrodes positioned according to the 10-20 International system of electrode placement. Electrical activity was reviewed with band pass filter of 1-70Hz , sensitivity of 7 uV/mm, display speed of 6mm/sec with a 60Hz   notched filter applied as appropriate. EEG data were recorded continuously and digitally stored.  Video monitoring was available and reviewed as appropriate.  Description: During awake state, no clear posterior dominant rhythm was seen.  Sleep was characterized by sleep spindles (13 to 15 Hz), maximal frontocentral region. EEG showed continuous generalized and at times rhythmic 3 to 6 Hz theta-delta slowing.   Hyperventilation and photic stimulation were not performed.    EEG was disconnected between 10/21/2023 1057 to 2329 for dialysis and imaging.  ABNORMALITY - Continuous slow, generalized  IMPRESSION: This study is suggestive of moderate diffuse encephalopathy. No seizures were noted.  Arleene Lack   EEG adult Result Date: 10/21/2023 Arleene Lack, MD     10/21/2023 11:47 AM Patient Name: [name] [name] MRN: [id number] Epilepsy Attending: Arleene Lack Referring Physician/Provider: Angelene Kelly, MD Date:  10/21/2023 Duration: 23.18 mins Patient history:  43 y.o. male with history of ESRD on hemodialysis, hypertension, prior stroke, history of seizures, lupus anticoagulant was brought to the ER after patient had a seizure.  EEG to evaluate for seizure. Level of alertness: Awake, asleep AEDs during EEG study: None Technical aspects: This EEG study was done with scalp electrodes positioned according to the 10-20 International system of electrode placement. Electrical activity was reviewed with band pass filter of 1-70Hz , sensitivity of 7 uV/mm, display speed of 28mm/sec with a 60Hz  notched filter applied as appropriate. EEG data were recorded continuously and digitally stored.  Video monitoring was available and reviewed as appropriate. Description: During awake state, no clear posterior dominant rhythm was seen.  Sleep was characterized by sleep spindles (13 to 15 Hz), maximal frontocentral region.  EEG showed continuous generalized and at times rhythmic 3 to 6 Hz theta-delta slowing.  Sharp  transients were noted in right frontal region. Hyperventilation and photic stimulation were not performed.   ABNORMALITY - Continuous slow, generalized IMPRESSION: This study is suggestive of moderate diffuse encephalopathy.  Sharp transients were noted in the right frontal region.  Because EEG showed rhythmic slowing as well as sharp transients, would recommend long-term EEG monitoring for further evaluation. Arleene Lack   CT Head Wo Contrast Result Date: 10/20/2023 CLINICAL DATA:  Seizure disorder, or altered mental status EXAM: CT HEAD WITHOUT CONTRAST TECHNIQUE: Contiguous axial images were obtained from the base of the skull through the vertex without intravenous contrast. RADIATION DOSE REDUCTION: This exam was performed according to the departmental dose-optimization program which includes automated exposure control, adjustment of the mA and/or kV according to patient size and/or use of iterative reconstruction technique. COMPARISON:  CT head 08/01/2023  FINDINGS: Brain: No intracranial hemorrhage, mass effect, or evidence of acute infarct. No hydrocephalus. No extra-axial fluid collection. Age-commensurate cerebral atrophy and chronic small vessel ischemic disease. Chronic left occipital, left insular, left parietal, left cerebellar, and right frontal infarcts. Vascular: No hyperdense vessel. Intracranial arterial calcification. Skull: No fracture or focal lesion. Sinuses/Orbits: No acute finding. Other: None. IMPRESSION: 1. No acute intracranial abnormality.  Multiple chronic infarcts. Electronically Signed   By: Rozell Cornet M.D.   On: 10/20/2023 20:24     PHYSICAL EXAM  Temp:  [97.7 F (36.5 C)-98.3 F (36.8 C)] 97.7 F (36.5 C) (05/15 1240) Pulse Rate:  [70-84] 76 (05/15 1240) Resp:  [8-20] 13 (05/15 1240) BP: (130-163)/(84-121) 133/102 (05/15 1240) SpO2:  [93 %-100 %] 98 % (05/15 1240)  General - Well nourished, well developed, in no apparent distress.  Ophthalmologic - fundi  not visualized due to noncooperation.  Cardiovascular - Regular rhythm and rate.  Neuro - awake, alert, eyes open but psychomotor slowing, orientated to place and self and age, but not to time. No aphasia but very limited speech output, with mild dysarthria, slow in response with questions or commands, but following all simple commands. Able to name 2/3 and repeat. No gaze palsy, tracking bilaterally, but poor vision with optic aprexia, seems to have right hemianopia. No facial droop. Tongue midline. LUE 4/5 and RUE 5/5, BLE 3/5. Sensation stated different bilaterally but can not tell which one abnormal or worsened sensation, b/l FTN no ataxia but with past pointing and optic aprexia, gait not tested.     ASSESSMENT/PLAN [name] [name] is a 43 y.o. male with history of hypertension, ESRD on hemodialysis, seizure, antiphospholipid syndrome, stroke admitted for seizure and altered mental status.   Stroke:  right MCA patchy infarcts as well as left thalamic and caudate head punctate infarcts, embolic likely secondary to antiphospholipid syndrome with underdosed Eliquis  CT no acute abnormality MRI brain 10/22/2023 no acute infarct MRI brain 10/27/2023 multiple acute infarcts in the right frontal, parietal and temporal cortex as well as the adjacent subcortical white matter.  A few punctate acute infarct in the left BG, sinus and overlying bladder matter. MRA nondiagnostic, would not change management Carotid Doppler unremarkable 2D Echo EF 65 to 70%, mitral valve calcified and restricted, possible rheumatic disease, moderate mitral stenosis, mild mitral regurgitation. LDL 75 HgbA1c 5.6 Eliquis  for VTE prophylaxis Not compliant with anticoagulation at home but on Eliquis  2.5 twice daily this admission until 10/27/2023, now on Eliquis  5 mg twice daily.  Ongoing aggressive stroke risk factor management Therapy recommendations: SNF Disposition: Pending  History of stroke SLE with antiphospholipid  syndrome 01/2023 admitted for left PCA infarct. MRI unremarkable. Carotid Doppler unremarkable. EF 55 to 60%. LDL 105, A1c 5.2. Discharged on Eliquis  and Lipitor 40.  03/2023 admitted for bilateral scattered punctate embolic shower.  CT showed old left parietal, PCA and cerebellar infarcts.  Discharged on Coumadin  with INR goal 2.5-3.5. Apparently patient was switched back to Eliquis  prior to current admission but only on 2.5 mg twice daily.  Seizure History of seizure Patient has history of complex partial seizure, known by neurology service Being seen several times in the past for seizure in clinic and hospital Home medication Keppra  500 twice daily. Questionable compliance Admitted 03/2023 for seizure and aspiration pneumonitis.  Received Ativan  and Keppra  load. EEG showed moderate to severe diffuse encephalopathy, no seizures or epileptiform discharges. Continued Keppra  This time also admitted for seizure and altered mental status.  LTM EEG showed  right frontal subclinical seizure.  Added Depakote to Keppra  Continue Keppra  500 twice daily Depakote level today 52->83-> 62 increase Depakote 500 Q8 ->1000 Q12 Ammonia level 27->30-> less than 13  Hypertension Stable but soft Long term BP goal normotensive  Hyperlipidemia Home meds: Lipitor 40 LDL 75, goal < 70 Now on Lipitor 40 Continue statin at discharge  Other Stroke Risk Factors ESRD on hemodialysis Obesity, Body mass index is 31.9 kg/m.   Other Active Problems Medication noncompliance Thrombocytopenia, platelet 126--106--104--95--73--72, stable now, need close monitoring (his platelet was low in April and early May without depakote, so likely not depakote related)   Hospital day # 10  Neurology will sign off. Please call with questions. Pt will follow up with stroke clinic NP at [facility] in about 4 weeks. Thanks for the consult.   Consuelo Denmark, MD PhD Stroke Neurology 10/31/2023 2:51 PM    To contact Stroke Continuity  provider, please refer to [email or web address]. After hours, contact General Neurology

## 2023-10-31 NOTE — Plan of Care (Signed)
  Problem: Education: Goal: Knowledge of General Education information will improve Description: Including pain rating scale, medication(s)/side effects and non-pharmacologic comfort measures Outcome: Progressing   Problem: Health Behavior/Discharge Planning: Goal: Ability to manage health-related needs will improve Outcome: Progressing   Problem: Clinical Measurements: Goal: Will remain free from infection Outcome: Progressing   Problem: Activity: Goal: Risk for activity intolerance will decrease Outcome: Progressing   Problem: Coping: Goal: Level of anxiety will decrease Outcome: Progressing   Problem: Elimination: Goal: Will not experience complications related to urinary retention Outcome: Progressing   Problem: Pain Managment: Goal: General experience of comfort will improve and/or be controlled Outcome: Progressing   Problem: Safety: Goal: Ability to remain free from injury will improve Outcome: Progressing   Problem: Ischemic Stroke/TIA Tissue Perfusion: Goal: Complications of ischemic stroke/TIA will be minimized Outcome: Progressing

## 2023-10-31 NOTE — Progress Notes (Addendum)
 Off from unit for HD.

## 2023-11-01 DIAGNOSIS — G9340 Encephalopathy, unspecified: Secondary | ICD-10-CM | POA: Diagnosis not present

## 2023-11-01 LAB — BASIC METABOLIC PANEL WITH GFR
Anion gap: 17 — ABNORMAL HIGH (ref 5–15)
BUN: 66 mg/dL — ABNORMAL HIGH (ref 6–20)
CO2: 22 mmol/L (ref 22–32)
Calcium: 9.7 mg/dL (ref 8.9–10.3)
Chloride: 93 mmol/L — ABNORMAL LOW (ref 98–111)
Creatinine, Ser: 17.56 mg/dL — ABNORMAL HIGH (ref 0.61–1.24)
GFR, Estimated: 3 mL/min — ABNORMAL LOW (ref >60)
Glucose, Bld: 72 mg/dL (ref 70–99)
Potassium: 4.7 mmol/L (ref 3.5–5.1)
Sodium: 132 mmol/L — ABNORMAL LOW (ref 135–145)

## 2023-11-01 LAB — CBC
HCT: 40.3 % (ref 39.0–52.0)
Hemoglobin: 13.9 g/dL (ref 13.0–17.0)
MCH: 30.8 pg (ref 26.0–34.0)
MCHC: 34.5 g/dL (ref 30.0–36.0)
MCV: 89.2 fL (ref 80.0–100.0)
Platelets: 68 10*3/uL — ABNORMAL LOW (ref 150–400)
RBC: 4.52 MIL/uL (ref 4.22–5.81)
RDW: 13.7 % (ref 11.5–15.5)
WBC: 8.2 10*3/uL (ref 4.0–10.5)
nRBC: 0 % (ref 0.0–0.2)

## 2023-11-01 LAB — VALPROIC ACID LEVEL: Valproic Acid Lvl: 76 ug/mL (ref 50–100)

## 2023-11-01 MED ORDER — AMLODIPINE BESYLATE 5 MG PO TABS
5.0000 mg | ORAL_TABLET | Freq: Every day | ORAL | Status: DC
  Administered 2023-11-01 – 2023-11-06 (×6): 5 mg via ORAL
  Filled 2023-11-01 (×5): qty 1

## 2023-11-01 NOTE — Progress Notes (Signed)
 PROGRESS NOTE [name] [name]  ZOX:[id number] DOB: 1980/09/08 DOA: 10/20/2023 PCP: [name] Dean, NP  Brief Narrative/Hospital Course: 43 y.o. male with history of ESRD on hemodialysis MWF, hypertension, prior stroke, history of seizures, lupus anticoagulant brought to the ER on 5/5 after patient had a seizure, encephalopathy. Neurology was consulted. Patient remains encephalopathic.  Had breakthrough seizure 5/9 while on HD, and moved to PCU and found to have new strokes in MRI  The patient has been seen by neurology and nephrology. EEG is suggestive of moderate diffuse encephalopathy.  Sharp transients were noted in the right frontal region. Per neurology ADDED ON ltm eeg for further evaluation. MRI on 5/6 showed no evidence of acute intracranial abnormality, and multiple remote infarcts.  PTOT following  Subjective Seen and examined Overnight afebrile BP remains stable, labs pending Resulted with BUN 66 creatinine 17 stable CBC with platelet 68 Alert awake oriented to self place including follows commands, but slow to respond no agitation or aggression   Assessment and Plan:  Acute right MCA patchy infarct as well as left thalamic and caudate head punctuate infarct Likely embolic, secondary to antiphospholipid syndrome with underdosed Eliquis . Neurology following, hide MRI brain 5/6 no acute infarct, MRI brain 5/11 multiple acute infarcts- SEE REPORT. MRA showed patency of major intracranial arteries. A1c 5.6.LDL: 75.  HDL: 26.  Carotid duplex no significant finding TTE:EF 70% mitral valve calcified, restricted possible rheumatic disease, moderate MS mild MR Noncompliant anticoagulation at home on 2.5 ELIQUIS  BID Now changed to 5 mg bid Now on Lipitor 40. Cont aggressive stroke risk modification , PTOT W/ Plan for SNF  Acute encephalopathy Seizure disorder-Patient had a breakthrough seizure on 5/9.: Initially thought due to postictal state, but persisted.   Given his MRI findings,  it seems most likely that the encephalopathy  2/2 strokes involving the frontal region. Work up [employer]: WNL.HIV: Nonreactive.B12: 539 B1 nl, RPR nonreactive. B1: Within EEG notable for BL sharps as well as right frontal subclinical seizure. Discontinue Wernicke's dose thiamine . Continue delirium precaution, Depakote (dose increased to 1000 q12 5/13) and CONT ON Keppra  DEPAKOTE LEVEL62. Neurology is following.  ESRD on HD MWF: Secondary hyperparathyroidism Anemia of CKD Nephrology following continue HD  last HD 5/15 Continue to monitor calcium  close and hemoglobin intermittently.   Hypertension Stable. Off amlodipine  and Coreg    Functional impairment Debility/deconditioning: Continue PT/OT -recommending skilled nursing facility  Class I Obesity w/ Body mass index is 31.9 kg/m.: Will benefit with PCP follow-up, weight loss,healthy lifestyle and outpatient sleep eval if not done.  DVT prophylaxis:  Code Status:   Code Status: Full Code Family Communication: plan of care discussed with patient at bedside. Patient status is: Remains hospitalized because of severity of illness Level of care: Progressive   Dispo: The patient is from: home. Unmarried. Next of kin is [name] per him.            Anticipated disposition: snf Objective: Vitals last 24 hrs: Vitals:   10/31/23 1240 10/31/23 2000 10/31/23 2344 11/01/23 0743  BP: (!) 133/102 (!) 159/113 (!) 146/106 (!) 146/117  Pulse: 76   70  Resp: 13 12  20   Temp: 97.7 F (36.5 C) 98.4 F (36.9 C) 98.4 F (36.9 C) 98.3 F (36.8 C)  TempSrc: Oral Oral Oral Oral  SpO2: 98% 99%  98%  Weight:      Height:        Physical Examination: General exam: alert awake, oriented x2 HEENT:Oral mucosa moist, Ear/Nose WNL grossly Respiratory  system: Bilaterally clear BS,no use of accessory muscle Cardiovascular system: S1 & S2 +, No JVD. Gastrointestinal system: Abdomen soft,NT,ND, BS+ Nervous System: Alert, awake, moving all  extremities,and following commands. Extremities: LE edema neg,distal peripheral pulses palpable and warm.  Skin: No rashes,no icterus. HD catheter+ MSK: Normal muscle bulk,tone, power   Data Reviewed: I have personally reviewed following labs and imaging studies ( see epic result tab) CBC: Recent Labs  Lab 10/27/23 0711 10/28/23 0709 10/30/23 0547 10/31/23 1005 11/01/23 0739  WBC 7.7 7.4 7.6 6.7 8.2  HGB 13.0 12.7* 13.3 14.0 13.9  HCT 39.1 37.8* 39.5 41.4 40.3  MCV 92.7 92.2 91.6 89.6 89.2  PLT 104* 95* 73* 72* 68*   CMP: Recent Labs  Lab 10/26/23 0732 10/27/23 0711 10/28/23 0709 10/29/23 0615 10/30/23 0547 10/31/23 1005 11/01/23 0739  NA 138   < > 138 136 135 134* 132*  K 4.6   < > 4.3 4.0 4.7 3.9 4.7  CL 97*   < > 92* 92* 92* 92* 93*  CO2 21*   < > 22 26 23 26 22   GLUCOSE 84   < > 77 92 78 83 72  BUN 42*   < > 82* 50* 65* 49* 66*  CREATININE 13.86*   < > 20.54* 15.60* 18.25* 13.87* 17.56*  CALCIUM  10.3   < > 10.0 9.0 9.3 9.1 9.7  MG 2.4  --   --   --   --   --   --   PHOS 7.8*  --   --   --   --   --   --    < > = values in this interval not displayed.   GFR: Estimated Creatinine Clearance: 6.8 mL/min (A) (by C-G formula based on SCr of 17.56 mg/dL (H)). No results for input(s): "AST", "ALT", "ALKPHOS", "BILITOT", "PROT", "ALBUMIN " in the last 168 hours.  No results for input(s): "LIPASE", "AMYLASE" in the last 168 hours.  Recent Labs  Lab 10/27/23 0711 10/31/23 1005  AMMONIA 30 <13   Coagulation Profile: No results for input(s): "INR", "PROTIME" in the last 168 hours. Unresulted Labs (From admission, onward)     Start     Ordered   11/01/23 1053  MRSA Next Gen by PCR, Nasal  Once,   R        11/01/23 1053           Antimicrobials/Microbiology: Anti-infectives (From admission, onward)    None         Component Value Date/Time   SDES BLOOD RIGHT HAND 01/19/2023 0213   SPECREQUEST  01/19/2023 1610    BOTTLES DRAWN AEROBIC AND ANAEROBIC Blood  Culture results may not be optimal due to an excessive volume of blood received in culture bottles   CULT  01/19/2023 0213    NO GROWTH 5 DAYS Performed at [facility], [address], [place], [place] [address]    REPTSTATUS 01/24/2023 FINAL 01/19/2023 4098     Medications reviewed:  Scheduled Meds:  apixaban   5 mg Oral BID   atorvastatin   40 mg Oral Daily   calcitRIOL   2.5 mcg Oral Q M,W,F-HD   cinacalcet   150 mg Oral Q M,W,F-HD   divalproex  1,000 mg Oral Q12H   feeding supplement (NEPRO CARB STEADY)  237 mL Oral BID BM   heparin  sodium (porcine)  4,000 Units Intracatheter Once   levETIRAcetam   500 mg Oral BID   levETIRAcetam   500 mg Oral Q M,W,F-HD  multivitamin  1 tablet Oral QHS   sevelamer  carbonate  3,200 mg Oral TID WC   sodium chloride  flush  10-40 mL Intracatheter Q12H   Continuous Infusions:  Lesa Rape, MD [facility] 11/01/2023, 11:52 AM

## 2023-11-01 NOTE — TOC Progression Note (Signed)
 Transition of Care [facility]) - Progression Note    Patient Details  Name: [name] [name] MRN: [id number] Date of Birth: 04-29-1981  Transition of Care [facility]) CM/SW Contact  Tandy Fam, [place] Phone Number: 11/01/2023, 3:26 PM  Clinical Narrative:   CSW attempted to meet with patient to discuss SNF placement, but more confused today. Patient said he has not been able to talk with his friend about the options yet, but provided CSW with permission to contact his friend Rashawn. CSW attempted to contact Rashawn, no answer. CSW to follow.    Expected Discharge Plan: Skilled Nursing Facility Barriers to Discharge: Continued Medical Work up, English as a second language teacher  Expected Discharge Plan and Services                                               Social Determinants of Health (SDOH) Interventions SDOH Screenings   Food Insecurity: No Food Insecurity (10/21/2023)  Housing: High Risk (10/21/2023)  Transportation Needs: No Transportation Needs (10/21/2023)  Utilities: Not At Risk (10/21/2023)  Alcohol Screen: Low Risk  (03/26/2023)  Depression (PHQ2-9): Low Risk  (03/26/2023)  Financial Resource Strain: Low Risk  (03/26/2023)  Physical Activity: Inactive (03/26/2023)  Social Connections: Moderately Isolated (10/21/2023)  Stress: No Stress Concern Present (03/26/2023)  Tobacco Use: High Risk (10/21/2023)  Health Literacy: Adequate Health Literacy (03/26/2023)    Readmission Risk Interventions    03/28/2023    2:57 PM 01/25/2023    3:40 PM  Readmission Risk Prevention Plan  Post Dischage Appt    Medication Screening    Transportation Screening Complete   Medication Review (RN Care Manager) Referral to Pharmacy   PCP or Specialist appointment within 3-5 days of discharge Complete   HRI or Home Care Consult Complete   SW Recovery Care/Counseling Consult Complete   Palliative Care Screening Not Applicable   Skilled Nursing Facility Not Applicable      Information is confidential  and restricted. Go to Review Flowsheets to unlock data.

## 2023-11-01 NOTE — Progress Notes (Signed)
 [facility] Progress Note   Subjective:   Patient seen in his room today. He is more alert today. Patient had HD yesterday, but he was moving his neck often, causing the blood pump to stop, and it subsequently clotted the machine. He was able to get 2 hours and 7 minutes of treatment. Plan for HD 11/02/23  BP trending up today. Restarted amlodipine  5 mg today.   Objective Vitals:   10/31/23 2000 10/31/23 2344 11/01/23 0743 11/01/23 1203  BP: (!) 159/113 (!) 146/106 (!) 146/117 (!) 140/93  Pulse:   70 70  Resp: 12  20 20   Temp: 98.4 F (36.9 C) 98.4 F (36.9 C) 98.3 F (36.8 C) 97.8 F (36.6 C)  TempSrc: Oral Oral Oral Oral  SpO2: 99%  98% 96%  Weight:      Height:       Physical Exam General: Alert, more responsive today, but disoriented to date Heart: RRR, no murmurs or gallops Lungs: CTA anteriorly  Abdomen: soft, non-tender, non-distended Extremities: trace LE edema Dialysis Access: R [facility]  Additional Objective Labs: Basic Metabolic Panel: Recent Labs  Lab 10/26/23 0732 10/27/23 0711 10/30/23 0547 10/31/23 1005 11/01/23 0739  NA 138   < > 135 134* 132*  K 4.6   < > 4.7 3.9 4.7  CL 97*   < > 92* 92* 93*  CO2 21*   < > 23 26 22   GLUCOSE 84   < > 78 83 72  BUN 42*   < > 65* 49* 66*  CREATININE 13.86*   < > 18.25* 13.87* 17.56*  CALCIUM  10.3   < > 9.3 9.1 9.7  PHOS 7.8*  --   --   --   --    < > = values in this interval not displayed.    CBC: Recent Labs  Lab 10/27/23 0711 10/28/23 0709 10/30/23 0547 10/31/23 1005 11/01/23 0739  WBC 7.7 7.4 7.6 6.7 8.2  HGB 13.0 12.7* 13.3 14.0 13.9  HCT 39.1 37.8* 39.5 41.4 40.3  MCV 92.7 92.2 91.6 89.6 89.2  PLT 104* 95* 73* 72* 68*   Blood Culture    Component Value Date/Time   SDES BLOOD RIGHT HAND 01/19/2023 0213   SPECREQUEST  01/19/2023 1610    BOTTLES DRAWN AEROBIC AND ANAEROBIC Blood Culture results may not be optimal due to an excessive volume of blood received in culture bottles   CULT   01/19/2023 0213    NO GROWTH 5 DAYS Performed at [facility], [address] [place], [place] [address]    REPTSTATUS 01/24/2023 FINAL 01/19/2023 4098     Medications:   amLODipine   5 mg Oral Daily   apixaban   5 mg Oral BID   atorvastatin   40 mg Oral Daily   calcitRIOL   2.5 mcg Oral Q M,W,F-HD   cinacalcet   150 mg Oral Q M,W,F-HD   divalproex  1,000 mg Oral Q12H   feeding supplement (NEPRO CARB STEADY)  237 mL Oral BID BM   heparin  sodium (porcine)  4,000 Units Intracatheter Once   levETIRAcetam   500 mg Oral BID   levETIRAcetam   500 mg Oral Q M,W,F-HD   multivitamin  1 tablet Oral QHS   sevelamer  carbonate  3,200 mg Oral TID WC   sodium chloride  flush  10-40 mL Intracatheter Q12H   Dialysis Orders: MWF [facility] 4h   B400   110.5kg  RIJ TDC   Heparin  none Mircera: none Calcitriol  2.5mcg PO qHD - last dose  11/01/23 Sensipar  150mg  with HD - last dose 11/01/23  Assessment/Plan: Seizure disorder - Neurology following; EEG suggestive of moderate diffuse encephalopathy, no seizures noted and MRI brain no acute changes. Had seizures on HD 5/09. Is on keppra , depakote and vimpat now. Per neuro/ pmd.  Acute encephalopathy - doing better, still some deficits per neurology. CT clear, and EEG showing moderate diffuse encephalopathy. MRI 10/27/23 showing Multiple acute infarcts in the right frontal, parietal and posterior temporal cortex as well as the adjacent subcortical white matter and punctate acute infarcts in the left basal ganglia, thalamus and overlying white matter  ESRD -  on HD MWF. Off normal schedule. HD 11/02/23.  HTN/ volume  - BP creeping up today. Restarted Amlodipine  5 mg today. Continue to follow BP trends.  Anemia of CKD - Current Hgb 13.9; at goal. No Fe/ESA is indicated at this time. Secondary hyperparathyroidism -  cont po vdra and sensipar  and binder Nutrition - Renal diet with fluid restriction  Hersey Lorenzo, NP-C 11/01/2023, 1:13 PM  [facility]

## 2023-11-01 NOTE — Plan of Care (Signed)
  Problem: Education: Goal: Knowledge of disease or condition will improve Outcome: Not Progressing Goal: Knowledge of secondary prevention will improve (MUST DOCUMENT ALL) Outcome: Not Progressing Goal: Knowledge of patient specific risk factors will improve (DELETE if not current risk factor) Outcome: Not Progressing   Problem: Ischemic Stroke/TIA Tissue Perfusion: Goal: Complications of ischemic stroke/TIA will be minimized Outcome: Not Progressing   Problem: Coping: Goal: Will verbalize positive feelings about self Outcome: Not Progressing Goal: Will identify appropriate support needs Outcome: Not Progressing   Problem: Health Behavior/Discharge Planning: Goal: Ability to manage health-related needs will improve Outcome: Not Progressing

## 2023-11-01 NOTE — Progress Notes (Signed)
 Speech Language Pathology Treatment: Cognitive-Linquistic  Patient Details Name: [name] [name] MRN: [id number] DOB: 1981-03-06 Today's Date: 11/01/2023 Time: 3295-1884 SLP Time Calculation (min) (ACUTE ONLY): 18 min  Assessment / Plan / Recommendation Clinical Impression  Pt seen at bedside for cognitive linguistic therapy. Pt was awake and alert, flat affect, intelligible speech. The Mini-Mental State Examination (MMSE) was administered as diagnostic treatment. Pt scored 11/30 (n=25+/30), indicating significant cognitive linguistic deficits. Pt accurate for orientation to person and place, immediate recall of 3/3 words and naming common objects. Pt inaccurate for orientation to time. He was unable to complete attention/calculation task, recall 3 words after a delay, repeat a short phrase, or complete reading/writing tasks. Pt followed 1 step out of a 3-step command. Pt has cognitive deficits at baseline per SLP evaluation in October 2024. No family present to discuss pt current level of function as compared to his history. Will follow acutely to facilitate basic cognitive processes and education.    HPI HPI: CHAQUAN ECKLEY is a 43 yo male presenting to ED 5/5 after having a seizure 5/2 and missing HD. He initially left AMA but returned. LTM EEG showed moderate diffuse encephalopathy and one seizure without clinical signs arising from the R frontal region. Seen by SLP 03/2023 targeting goals related to problem solving, selective attention, and auditory comprehension. PMH includes ESRD on HD, HTN, prior stroke, seizures, lupus      SLP Plan  Continue with current plan of care      Recommendations for follow up therapy are one component of a multi-disciplinary discharge planning process, led by the attending physician.  Recommendations may be updated based on patient status, additional functional criteria and insurance authorization.    Recommendations   SNF                      Frequent or constant Supervision/Assistance Cognitive communication deficit (R41.841)     Continue with current plan of care    Bueford Arp B. Arby Beam, [facility], CCC-SLP Speech Language Pathologist Office: [phone]  Adine Ahmadi 11/01/2023, 11:45 AM

## 2023-11-01 NOTE — TOC CAGE-AID Note (Signed)
 Transition of Care [facility]) - CAGE-AID Screening   Patient Details  Name: [name] [name] MRN: [id number] Date of Birth: 1981/01/16  Transition of Care [facility]) CM/SW Contact:    Jannice Mends, LCSW Phone Number: 11/01/2023, 9:10 AM   Clinical Narrative: Patient disoriented and not appropriate for assessment at this time.    CAGE-AID Screening: Substance Abuse Screening unable to be completed due to: : Patient unable to participate

## 2023-11-01 NOTE — Progress Notes (Signed)
 Occupational Therapy Treatment Patient Details Name: [name] [name] MRN: [id number] DOB: 07/19/1980 Today's Date: 11/01/2023   History of present illness Patient is a 43 year old male with witnessed seizure at dialysis on 10/21/2023. Patient leaving AMA, and returning a few hours later via [facility]. CT clear, and EEG showing moderate diffuse encephalopathy. MRI 10/27/23 showing Multiple acute infarcts in the right frontal, parietal and posterior temporal cortex as well as the adjacent subcortical white matter and punctate acute infarcts in the left basal ganglia, thalamus and overlying white matter. PMH: ESRD on HD, antiphospholipid syndrome, HTN, seizure, stroke.   OT comments  Pt. Seen for skilled OT treatment session with focus on self feeding.  Initially MIN/MOD A. With verbal and tactile support for accuracy and initiation.  As meal progressed pt. Transitioned to set up for meal and beverage.  Alternating between L/R, and BUEs for eating/drinking.  Some over shooting and accuracy issues with scooping initially but improved accuracy noted throughout the meal.  Cont. With acute OT POC.        If plan is discharge home, recommend the following:  Two people to help with walking and/or transfers;Two people to help with bathing/dressing/bathroom;Assistance with cooking/housework;Direct supervision/assist for medications management;Direct supervision/assist for financial management;Assist for transportation;Help with stairs or ramp for entrance   Equipment Recommendations  Other (comment)    Recommendations for Other Services      Precautions / Restrictions Precautions Precautions: Fall Recall of Precautions/Restrictions: Intact       Mobility Bed Mobility                    Transfers                         Balance                                           ADL either performed or assessed with clinical judgement   ADL Overall ADL's : Needs  assistance/impaired Eating/Feeding: Minimal assistance;Moderate assistance;Bed level Eating/Feeding Details (indicate cue type and reason): initially overshoots with efforts to scoop with RUE and spoon.  with some light correction able to scoop peaches out of container with Rue.  B hands to eat english muffin sandwich then transitioned to eating with LUE.  as meal progressed improved accuracy with scooping using RUE.  able to drink from cup with no spillage or accuracy issues.  less verbal and tactile support as meal progressed. demonstrating problem solving. ie: too much juice in peaches so brought to his mouth to drink some of the juice before continued efforts of scooping.  able to open lid on pineapple container.  engaging in B hand integration without cues.                                        Extremity/Trunk Assessment              Occupational psychologist Communication: Impaired Factors Affecting Communication: Reduced clarity of speech   Cognition Arousal: Alert Behavior During Therapy: Flat affect Cognition: Cognition impaired   Orientation impairments: Place, Time, Situation Awareness: Intellectual awareness impaired, Online awareness impaired Memory impairment (select all impairments):  Short-term memory, Working [employer] fast streamer, Conservation officer, historic buildings, Non-declarative long-term memory Attention impairment (select first level of impairment): Sustained attention Executive functioning impairment (select all impairments): Initiation, Organization, Sequencing, Reasoning, Problem solving                   Following commands: Impaired Following commands impaired: Follows one step commands inconsistently, Follows one step commands with increased time      Cueing   Cueing Techniques: Verbal cues, Tactile cues, Visual cues, Gestural cues  Exercises      Shoulder Instructions       General Comments       Pertinent Vitals/ Pain       Pain Assessment Pain Assessment: No/denies pain  Home Living                                          Prior Functioning/Environment              Frequency  Min 2X/week        Progress Toward Goals  OT Goals(current goals can now be found in the care plan section)  Progress towards OT goals: Progressing toward goals     Plan      Co-evaluation                 AM-PAC OT "6 Clicks" Daily Activity     Outcome Measure   Help from another person eating meals?: A Lot Help from another person taking care of personal grooming?: A Lot Help from another person toileting, which includes using toliet, bedpan, or urinal?: A Lot Help from another person bathing (including washing, rinsing, drying)?: A Lot Help from another person to put on and taking off regular upper body clothing?: A Lot Help from another person to put on and taking off regular lower body clothing?: A Lot 6 Click Score: 12    End of Session    OT Visit Diagnosis: Unsteadiness on feet (R26.81);Other abnormalities of gait and mobility (R26.89);Muscle weakness (generalized) (M62.81);Other symptoms and signs involving the nervous system (R29.898);Other symptoms and signs involving cognitive function   Activity Tolerance Patient tolerated treatment well   Patient Left in bed;with call bell/phone within reach;with bed alarm set   Nurse Communication Other (comment) (RN states ok for pt. to have some ice water  since he is not interested in milk or coffee on the tray)        Time: 9811-9147 OT Time Calculation (min): 30 min  Charges: OT General Charges $OT Visit: 1 Visit OT Treatments $Self Care/Home Management : 23-37 mins  Howell Macintosh, COTA/L Acute Rehabilitation [phone]   Leory Rands [place]-COTA/L  11/01/2023, 9:36 AM

## 2023-11-01 NOTE — Plan of Care (Signed)
 Problem: Education: Goal: Knowledge of General Education information will improve Description: Including pain rating scale, medication(s)/side effects and non-pharmacologic comfort measures Outcome: Not Progressing   Problem: Health Behavior/Discharge Planning: Goal: Ability to manage health-related needs will improve Outcome: Not Progressing   Problem: Clinical Measurements: Goal: Ability to maintain clinical measurements within normal limits will improve Outcome: Not Progressing Goal: Will remain free from infection Outcome: Not Progressing Goal: Diagnostic test results will improve 11/01/2023 0637 by Essie Hefty, RN Outcome: Not Progressing 11/01/2023 0124 by Essie Hefty, RN Outcome: Progressing Goal: Respiratory complications will improve Outcome: Not Progressing Goal: Cardiovascular complication will be avoided Outcome: Not Progressing   Problem: Activity: Goal: Risk for activity intolerance will decrease Outcome: Not Progressing   Problem: Nutrition: Goal: Adequate nutrition will be maintained Outcome: Not Progressing   Problem: Coping: Goal: Level of anxiety will decrease Outcome: Not Progressing   Problem: Elimination: Goal: Will not experience complications related to bowel motility Outcome: Not Progressing Goal: Will not experience complications related to urinary retention Outcome: Not Progressing   Problem: Pain Managment: Goal: General experience of comfort will improve and/or be controlled Outcome: Not Progressing   Problem: Safety: Goal: Ability to remain free from injury will improve Outcome: Not Progressing   Problem: Skin Integrity: Goal: Risk for impaired skin integrity will decrease Outcome: Not Progressing   Problem: Education: Goal: Knowledge of disease and its progression will improve Outcome: Not Progressing Goal: Individualized Educational Video(s) Outcome: Not Progressing   Problem: Fluid Volume: Goal: Compliance  with measures to maintain balanced fluid volume will improve Outcome: Not Progressing   Problem: Health Behavior/Discharge Planning: Goal: Ability to manage health-related needs will improve Outcome: Not Progressing   Problem: Nutritional: Goal: Ability to make healthy dietary choices will improve Outcome: Not Progressing   Problem: Clinical Measurements: Goal: Complications related to the disease process, condition or treatment will be avoided or minimized Outcome: Not Progressing   Problem: Education: Goal: Knowledge of disease or condition will improve 11/01/2023 0637 by Essie Hefty, RN Outcome: Not Progressing 11/01/2023 0124 by Essie Hefty, RN Outcome: Not Progressing Goal: Knowledge of secondary prevention will improve (MUST DOCUMENT ALL) 11/01/2023 0637 by Essie Hefty, RN Outcome: Not Progressing 11/01/2023 0124 by Essie Hefty, RN Outcome: Not Progressing Goal: Knowledge of patient specific risk factors will improve (DELETE if not current risk factor) 11/01/2023 0637 by Essie Hefty, RN Outcome: Not Progressing 11/01/2023 0124 by Essie Hefty, RN Outcome: Not Progressing   Problem: Ischemic Stroke/TIA Tissue Perfusion: Goal: Complications of ischemic stroke/TIA will be minimized 11/01/2023 0637 by Essie Hefty, RN Outcome: Not Progressing 11/01/2023 0124 by Essie Hefty, RN Outcome: Not Progressing   Problem: Coping: Goal: Will verbalize positive feelings about self 11/01/2023 0637 by Essie Hefty, RN Outcome: Not Progressing 11/01/2023 0124 by Essie Hefty, RN Outcome: Not Progressing Goal: Will identify appropriate support needs 11/01/2023 0637 by Essie Hefty, RN Outcome: Not Progressing 11/01/2023 0124 by Essie Hefty, RN Outcome: Not Progressing   Problem: Health Behavior/Discharge Planning: Goal: Ability to manage health-related needs will improve 11/01/2023 0637 by Essie Hefty, RN Outcome: Not Progressing 11/01/2023 0124 by Essie Hefty, RN Outcome: Not Progressing Goal: Goals will be collaboratively established with patient/family Outcome: Not Progressing   Problem: Self-Care: Goal: Ability to participate in self-care as condition permits will improve Outcome: Not Progressing Goal: Verbalization of feelings and concerns over difficulty with self-care will improve Outcome: Not Progressing Goal: Ability to communicate  needs accurately will improve Outcome: Not Progressing   Problem: Nutrition: Goal: Risk of aspiration will decrease Outcome: Not Progressing Goal: Dietary intake will improve Outcome: Not Progressing

## 2023-11-01 NOTE — Progress Notes (Signed)
 Physical Therapy Treatment Patient Details Name: [name] [name] MRN: [id number] DOB: 01-18-1981 Today's Date: 11/01/2023   History of Present Illness Patient is a 43 year old male with witnessed seizure at dialysis on 10/21/2023. Patient leaving AMA, and returning a few hours later via [employer]. CT clear, and EEG showing moderate diffuse encephalopathy. MRI 10/27/23 showing Multiple acute infarcts in the right frontal, parietal and posterior temporal cortex as well as the adjacent subcortical white matter and punctate acute infarcts in the left basal ganglia, thalamus and overlying white matter. PMH: ESRD on HD, antiphospholipid syndrome, HTN, seizure, stroke.    PT Comments  Pt is progressing towards goals. Pt is currently CGA to supervision for bed mobility, CGA for sit to stand and CGA to Min A for gait with assist navigating, sequencing and slowing down for turns for safety. No buckling noted at knees. Due to pt current functional status, home set up and available assistance at home recommending skilled physical therapy services < 3 hours/day in order to address strength, balance and functional mobility to decrease risk for falls, injury, immobility, skin break down and re-hospitalization.      If plan is discharge home, recommend the following: Assistance with cooking/housework;Supervision due to cognitive status;Help with stairs or ramp for entrance;A little help with walking and/or transfers   Can travel by private vehicle     No  Equipment Recommendations  Rolling walker (2 wheels);BSC/3in1       Precautions / Restrictions Precautions Precautions: Fall Recall of Precautions/Restrictions: Intact Restrictions Weight Bearing Restrictions Per Provider Order: No     Mobility  Bed Mobility Overal bed mobility: Needs Assistance Bed Mobility: Supine to Sit, Sit to Supine     Supine to sit: Contact guard Sit to supine: Supervision        Transfers Overall transfer level: Needs  assistance Equipment used: Rolling walker (2 wheels) Transfers: Sit to/from Stand Sit to Stand: Contact guard assist           General transfer comment: CGA from EOB, improved hand placement this session    Ambulation/Gait Ambulation/Gait assistance: Contact guard assist, Min assist Gait Distance (Feet): 80 Feet Assistive device: Rolling walker (2 wheels) Gait Pattern/deviations: Step-through pattern, Decreased stride length, Shuffle Gait velocity: decreased Gait velocity interpretation: <1.31 ft/sec, indicative of household ambulator   General Gait Details: Improved step length this session. Flat foto initial contact. Intermittently requires cues to look at feet due to L foot drifts out of parameters of AD. Extra time for pt to look at L foot and recognize the issue with heavy cues.    Modified Rankin (Stroke Patients Only) Modified Rankin (Stroke Patients Only) Pre-Morbid Rankin Score: Slight disability Modified Rankin: Moderately severe disability     Balance Overall balance assessment: Needs assistance Sitting-balance support: Feet supported Sitting balance-Leahy Scale: Fair     Standing balance support: Bilateral upper extremity supported, Reliant on assistive device for balance Standing balance-Leahy Scale: Fair Standing balance comment: no overt LOB        Communication Communication Communication: Impaired Factors Affecting Communication: Reduced clarity of speech  Cognition Arousal: Alert Behavior During Therapy: Flat affect   PT - Cognitive impairments: Orientation   Orientation impairments: Place, Time     PT - Cognition Comments: slow to respond at times. increased time for motor planning, occasional cues for initiation and sequencing Following commands: Impaired Following commands impaired: Follows one step commands inconsistently, Follows one step commands with increased time    Cueing Cueing Techniques: Verbal cues,  Tactile cues, Visual cues,  Gestural cues         Pertinent Vitals/Pain Pain Assessment Pain Assessment: No/denies pain Pain Intervention(s): Monitored during session     PT Goals (current goals can now be found in the care plan section) Acute Rehab PT Goals Patient Stated Goal: none stated PT Goal Formulation: Patient unable to participate in goal setting Time For Goal Achievement: 11/12/23 Potential to Achieve Goals: Fair Progress towards PT goals: Progressing toward goals    Frequency    Min 2X/week      PT Plan  Continue with current POC        AM-PAC PT "6 Clicks" Mobility   Outcome Measure  Help needed turning from your back to your side while in a flat bed without using bedrails?: A Little Help needed moving from lying on your back to sitting on the side of a flat bed without using bedrails?: A Little Help needed moving to and from a bed to a chair (including a wheelchair)?: A Little Help needed standing up from a chair using your arms (e.g., wheelchair or bedside chair)?: A Little Help needed to walk in hospital room?: A Little Help needed climbing 3-5 steps with a railing? : A Lot 6 Click Score: 17    End of Session Equipment Utilized During Treatment: Gait belt Activity Tolerance: Patient limited by fatigue;Patient tolerated treatment well Patient left: in bed;with call bell/phone within reach;with bed alarm set Nurse Communication: Mobility status PT Visit Diagnosis: Muscle weakness (generalized) (M62.81);Unsteadiness on feet (R26.81)     Time: 6962-9528 PT Time Calculation (min) (ACUTE ONLY): 27 min  Charges:    $Gait Training: 8-22 mins $Therapeutic Activity: 8-22 mins PT General Charges $$ ACUTE PT VISIT: 1 Visit                    Sloan Duncans, DPT, CLT  Acute Rehabilitation Services Office: [phone] (Secure chat preferred)    [name] [name] 11/01/2023, 3:46 PM

## 2023-11-02 DIAGNOSIS — G9340 Encephalopathy, unspecified: Secondary | ICD-10-CM | POA: Diagnosis not present

## 2023-11-02 LAB — RENAL FUNCTION PANEL
Albumin: 3.5 g/dL (ref 3.5–5.0)
Anion gap: 18 — ABNORMAL HIGH (ref 5–15)
BUN: 80 mg/dL — ABNORMAL HIGH (ref 6–20)
CO2: 23 mmol/L (ref 22–32)
Calcium: 9.5 mg/dL (ref 8.9–10.3)
Chloride: 93 mmol/L — ABNORMAL LOW (ref 98–111)
Creatinine, Ser: 20.79 mg/dL — ABNORMAL HIGH (ref 0.61–1.24)
GFR, Estimated: 2 mL/min — ABNORMAL LOW (ref >60)
Glucose, Bld: 83 mg/dL (ref 70–99)
Phosphorus: 9.5 mg/dL — ABNORMAL HIGH (ref 2.5–4.6)
Potassium: 4.7 mmol/L (ref 3.5–5.1)
Sodium: 134 mmol/L — ABNORMAL LOW (ref 135–145)

## 2023-11-02 LAB — CBC
HCT: 36 % — ABNORMAL LOW (ref 39.0–52.0)
Hemoglobin: 12.4 g/dL — ABNORMAL LOW (ref 13.0–17.0)
MCH: 30.9 pg (ref 26.0–34.0)
MCHC: 34.4 g/dL (ref 30.0–36.0)
MCV: 89.8 fL (ref 80.0–100.0)
Platelets: 60 10*3/uL — ABNORMAL LOW (ref 150–400)
RBC: 4.01 MIL/uL — ABNORMAL LOW (ref 4.22–5.81)
RDW: 13.6 % (ref 11.5–15.5)
WBC: 7.1 10*3/uL (ref 4.0–10.5)
nRBC: 0 % (ref 0.0–0.2)

## 2023-11-02 LAB — MRSA NEXT GEN BY PCR, NASAL: MRSA by PCR Next Gen: NOT DETECTED

## 2023-11-02 MED ORDER — DIPHENHYDRAMINE HCL 25 MG PO CAPS
25.0000 mg | ORAL_CAPSULE | Freq: Once | ORAL | Status: AC
  Administered 2023-11-02: 25 mg via ORAL

## 2023-11-02 MED ORDER — HEPARIN SODIUM (PORCINE) 1000 UNIT/ML IJ SOLN
INTRAMUSCULAR | Status: AC
  Filled 2023-11-02: qty 4

## 2023-11-02 MED ORDER — DIPHENHYDRAMINE HCL 25 MG PO CAPS
ORAL_CAPSULE | ORAL | Status: AC
  Filled 2023-11-02: qty 1

## 2023-11-02 MED ORDER — DIPHENHYDRAMINE HCL 25 MG PO CAPS
25.0000 mg | ORAL_CAPSULE | Freq: Four times a day (QID) | ORAL | Status: AC | PRN
  Administered 2023-11-04: 25 mg via ORAL

## 2023-11-02 MED ORDER — CALCITRIOL 0.5 MCG PO CAPS
ORAL_CAPSULE | ORAL | Status: AC
  Filled 2023-11-02: qty 5

## 2023-11-02 NOTE — Plan of Care (Signed)
  Problem: Education: Goal: Knowledge of General Education information will improve Description: Including pain rating scale, medication(s)/side effects and non-pharmacologic comfort measures Outcome: Progressing   Problem: Clinical Measurements: Goal: Ability to maintain clinical measurements within normal limits will improve Outcome: Progressing   Problem: Fluid Volume: Goal: Compliance with measures to maintain balanced fluid volume will improve Outcome: Progressing   Problem: Nutritional: Goal: Ability to make healthy dietary choices will improve Outcome: Progressing   Problem: Education: Goal: Knowledge of disease or condition will improve Outcome: Progressing   Problem: Ischemic Stroke/TIA Tissue Perfusion: Goal: Complications of ischemic stroke/TIA will be minimized Outcome: Progressing   Problem: Coping: Goal: Will verbalize positive feelings about self Outcome: Progressing   Problem: Self-Care: Goal: Ability to participate in self-care as condition permits will improve Outcome: Progressing

## 2023-11-02 NOTE — Progress Notes (Signed)
 PROGRESS NOTE [name]  BJY:[id number] DOB: 03-Dec-1980 DOA: 10/20/2023 PCP: Collins Dean, NP  Brief Narrative/Hospital Course: 43 y.o. male with history of ESRD on hemodialysis MWF, hypertension, prior stroke, history of seizures, lupus anticoagulant brought to the ER on 5/5 after patient had a seizure, encephalopathy. Neurology was consulted. Patient remains encephalopathic.  Had breakthrough seizure 5/9 while on HD, and moved to PCU and found to have new strokes in MRI  The patient has been seen by neurology and nephrology. EEG is suggestive of moderate diffuse encephalopathy.  Sharp transients were noted in the right frontal region. Per neurology ADDED ON ltm eeg for further evaluation. MRI on 5/6 showed no evidence of acute intracranial abnormality, and multiple remote infarcts.  PTOT following  Subjective Seen this am He is alert awake follows commands Overnight afebrile BP stable 140s-160s systolic, on room air Mental status overall unchanged    Assessment and Plan:  Acute right MCA patchy infarct as well as left thalamic and caudate head punctuate infarct Likely embolic, secondary to antiphospholipid syndrome with underdosed Eliquis . Neurology following, hide MRI brain 5/6 no acute infarct, MRI brain 5/11 multiple acute infarcts- SEE REPORT. MRA showed patency of major intracranial arteries. A1c 5.6.LDL: 75.  HDL: 26.  Carotid duplex no significant finding TTE:EF 70% mitral valve calcified, restricted possible rheumatic disease, moderate MS mild MR Noncompliant anticoagulation at home on 2.5 ELIQUIS  BID Now changed to 5 mg bid Now on Lipitor 40. Cont aggressive stroke risk modification , PTOT W/ Plan for SNF  Acute encephalopathy Seizure disorder-Patient had a breakthrough seizure on 5/9.: Initially thought due to postictal state, but persisted.   Given his MRI findings, it seems most likely that the encephalopathy  2/2 strokes involving the frontal region. Work up  [employer]: WNL.HIV: Nonreactive.B12: 539 B1 nl, RPR nonreactive. B1: Within EEG notable for BL sharps as well as right frontal subclinical seizure. Off high-dose.  Mentation overall stable alert awake follows commands not agitated. Neurology signed off, continue on increased dose of Keppra  500 twice daily and 500 MWF, Depakote  1000 mg q12  ESRD on HD MWF: Secondary hyperparathyroidism/MBD Anemia of CKD Nephrology following continue HD-due for HD today Continue to monitor calcium  close and hemoglobin intermittently. Continue Renvela , calcitriol  and Sensipar    Hypertension Stable. Off amlodipine  and Coreg    Functional impairment Debility/deconditioning: Continue PT/OT -recommending skilled nursing facility  Class I Obesity w/ Body mass index is 32.25 kg/m.: Will benefit with PCP follow-up, weight loss,healthy lifestyle and outpatient sleep eval if not done.  DVT prophylaxis:  Code Status:   Code Status: Full Code Family Communication: plan of care discussed with patient at bedside. Patient status is: Remains hospitalized because of severity of illness Level of care: Progressive   Dispo: The patient is from: home. Unmarried. Next of kin is [name] per him.            Anticipated disposition: snf Objective: Vitals last 24 hrs: Vitals:   11/02/23 0346 11/02/23 0817 11/02/23 1056 11/02/23 1100  BP: (!) 153/115 (!) 144/101 (!) 148/111 (!) 151/101  Pulse: 83 90 69 67  Resp: 18 18 11 12   Temp: (!) 97.5 F (36.4 C) (!) 97.5 F (36.4 C) 97.8 F (36.6 C)   TempSrc: Oral Oral    SpO2: 93%  96% 100%  Weight:   107.9 kg   Height:        Physical Examination: General exam: alert awake, oriented x 2 HEENT:Oral mucosa moist, Ear/Nose WNL grossly Respiratory system: Bilaterally  clear BS,no use of accessory muscle Cardiovascular system: S1 & S2 +, No JVD. Gastrointestinal system: Abdomen soft,NT,ND, BS+ Nervous System: Alert, awake, moving all extremities,and following  commands. Extremities: LE edema neg,distal peripheral pulses palpable and warm.  Skin: No rashes,no icterus. MSK: Normal muscle bulk,tone, power   Data Reviewed: I have personally reviewed following labs and imaging studies ( see epic result tab) CBC: Recent Labs  Lab 10/27/23 0711 10/28/23 0709 10/30/23 0547 10/31/23 1005 11/01/23 0739  WBC 7.7 7.4 7.6 6.7 8.2  HGB 13.0 12.7* 13.3 14.0 13.9  HCT 39.1 37.8* 39.5 41.4 40.3  MCV 92.7 92.2 91.6 89.6 89.2  PLT 104* 95* 73* 72* 68*   CMP: Recent Labs  Lab 10/28/23 0709 10/29/23 0615 10/30/23 0547 10/31/23 1005 11/01/23 0739  NA 138 136 135 134* 132*  K 4.3 4.0 4.7 3.9 4.7  CL 92* 92* 92* 92* 93*  CO2 22 26 23 26 22   GLUCOSE 77 92 78 83 72  BUN 82* 50* 65* 49* 66*  CREATININE 20.54* 15.60* 18.25* 13.87* 17.56*  CALCIUM  10.0 9.0 9.3 9.1 9.7   GFR: Estimated Creatinine Clearance: 6.8 mL/min (A) (by C-G formula based on SCr of 17.56 mg/dL (H)). No results for input(s): "AST", "ALT", "ALKPHOS", "BILITOT", "PROT", "ALBUMIN " in the last 168 hours.  No results for input(s): "LIPASE", "AMYLASE" in the last 168 hours.  Recent Labs  Lab 10/27/23 0711 10/31/23 1005  AMMONIA 30 <13   Coagulation Profile: No results for input(s): "INR", "PROTIME" in the last 168 hours. Unresulted Labs (From admission, onward)     Start     Ordered   11/02/23 1100  CBC  Once,   R       Question:  Specimen collection method  Answer:  Lab=Lab collect   11/02/23 1059   11/02/23 1100  Renal function panel  Once,   R       Question:  Specimen collection method  Answer:  Lab=Lab collect   11/02/23 1059           Antimicrobials/Microbiology: Anti-infectives (From admission, onward)    None         Component Value Date/Time   SDES BLOOD RIGHT HAND 01/19/2023 0213   SPECREQUEST  01/19/2023 1610    BOTTLES DRAWN AEROBIC AND ANAEROBIC Blood Culture results may not be optimal due to an excessive volume of blood received in culture bottles    CULT  01/19/2023 0213    NO GROWTH 5 DAYS Performed at [facility], [address], [place], [place] [address]    REPTSTATUS 01/24/2023 FINAL 01/19/2023 4098     Medications reviewed:  Scheduled Meds:  amLODipine   5 mg Oral Daily   apixaban   5 mg Oral BID   atorvastatin   40 mg Oral Daily   calcitRIOL   2.5 mcg Oral Q M,W,F-HD   cinacalcet   150 mg Oral Q M,W,F-HD   divalproex   1,000 mg Oral Q12H   feeding supplement (NEPRO CARB STEADY)  237 mL Oral BID BM   heparin  sodium (porcine)  4,000 Units Intracatheter Once   levETIRAcetam   500 mg Oral BID   levETIRAcetam   500 mg Oral Q M,W,F-HD   multivitamin  1 tablet Oral QHS   sevelamer  carbonate  3,200 mg Oral TID WC   sodium chloride  flush  10-40 mL Intracatheter Q12H   Continuous Infusions:  Lesa Rape, MD [facility] 11/02/2023, 11:09 AM

## 2023-11-02 NOTE — Procedures (Signed)
 HD Note:  Some information was entered later than the data was gathered due to patient care needs. The stated time with the data is accurate.  Received patient in bed to unit.   Alert and oriented when asked questions.  Flat affect and does not imitate conversation  Informed consent signed and in chart.   Access used: Upper right chest HD catheter Access issues: None  Patient was itching and given benadryl , see MAR toward the end of treatment.  BP dropped in last 30 min.  Uf paused, bed placed in trendelenburg position. 100 ml bolus of NS given.  Patient stated he had no sensation of dizziness or any other ill effect. Patient BP at the end of treatment required another 100 ml bolus of NS  BP 108/70 prior to leaving the unit  TX duration: 3.5 hours  Alert, without acute distress.  Total UF removed: 2400  Hand-off given to patient's nurse.   Transported back to the room   Euphemia Lingerfelt L. Alva Jewels, RN [facility].

## 2023-11-02 NOTE — Progress Notes (Signed)
 [facility] Progress Note   Subjective:   Pt seen in room, alert but flat affect. Denies SOB, CP, dizziness. Has not eaten any of his breakfast.   Objective Vitals:   11/01/23 2028 11/02/23 0030 11/02/23 0346 11/02/23 0817  BP: (!) 168/111 (!) 146/95 (!) 153/115 (!) 144/101  Pulse: 80 86 83 90  Resp: 18 18 18 18   Temp: (!) 97.5 F (36.4 C) 97.6 F (36.4 C) (!) 97.5 F (36.4 C) (!) 97.5 F (36.4 C)  TempSrc: Oral Oral Oral Oral  SpO2: 92% 94% 93%   Weight:      Height:       Physical Exam General: Alert, NAD Heart: RRR, no murmurs, rubs or gallops  Lungs:CTA bilaterally, respirations unlabored Abdomen: Soft, non-distended, +BS Extremities: trace edema b/l lower extremities Dialysis Access:  R internal jugular [facility]  Additional Objective Labs: Basic Metabolic Panel: Recent Labs  Lab 10/30/23 0547 10/31/23 1005 11/01/23 0739  NA 135 134* 132*  K 4.7 3.9 4.7  CL 92* 92* 93*  CO2 23 26 22   GLUCOSE 78 83 72  BUN 65* 49* 66*  CREATININE 18.25* 13.87* 17.56*  CALCIUM  9.3 9.1 9.7   Liver Function Tests: No results for input(s): "AST", "ALT", "ALKPHOS", "BILITOT", "PROT", "ALBUMIN " in the last 168 hours. No results for input(s): "LIPASE", "AMYLASE" in the last 168 hours. CBC: Recent Labs  Lab 10/27/23 0711 10/28/23 0709 10/30/23 0547 10/31/23 1005 11/01/23 0739  WBC 7.7 7.4 7.6 6.7 8.2  HGB 13.0 12.7* 13.3 14.0 13.9  HCT 39.1 37.8* 39.5 41.4 40.3  MCV 92.7 92.2 91.6 89.6 89.2  PLT 104* 95* 73* 72* 68*   Blood Culture    Component Value Date/Time   SDES BLOOD RIGHT HAND 01/19/2023 0213   SPECREQUEST  01/19/2023 1610    BOTTLES DRAWN AEROBIC AND ANAEROBIC Blood Culture results may not be optimal due to an excessive volume of blood received in culture bottles   CULT  01/19/2023 0213    NO GROWTH 5 DAYS Performed at [facility], [address] [place], [place] [address]    REPTSTATUS 01/24/2023 FINAL 01/19/2023 4098    Cardiac  Enzymes: No results for input(s): "CKTOTAL", "CKMB", "CKMBINDEX", "TROPONINI" in the last 168 hours. CBG: No results for input(s): "GLUCAP" in the last 168 hours. Iron  Studies: No results for input(s): "IRON ", "TIBC", "TRANSFERRIN", "FERRITIN" in the last 72 hours. @lablastinr3 @ Studies/Results: No results found. Medications:   amLODipine   5 mg Oral Daily   apixaban   5 mg Oral BID   atorvastatin   40 mg Oral Daily   calcitRIOL   2.5 mcg Oral Q M,W,F-HD   cinacalcet   150 mg Oral Q M,W,F-HD   divalproex   1,000 mg Oral Q12H   feeding supplement (NEPRO CARB STEADY)  237 mL Oral BID BM   heparin  sodium (porcine)  4,000 Units Intracatheter Once   levETIRAcetam   500 mg Oral BID   levETIRAcetam   500 mg Oral Q M,W,F-HD   multivitamin  1 tablet Oral QHS   sevelamer  carbonate  3,200 mg Oral TID WC   sodium chloride  flush  10-40 mL Intracatheter Q12H    Dialysis Orders:  MWF [facility] 4h   B400   110.5kg  RIJ TDC   Heparin  none Mircera: none Calcitriol  2.5mcg PO qHD - last dose 11/01/23 Sensipar  150mg  with HD - last dose 11/01/23  Assessment/Plan: Seizure disorder - Neurology following; EEG suggestive of moderate diffuse encephalopathy, no seizures noted and MRI brain no acute changes. Had  seizures on HD 5/09. Is on keppra , depakote  and vimpat  now. Per neuro/ pmd.  Acute encephalopathy - doing better, still some deficits per neurology. CT clear, and EEG showing moderate diffuse encephalopathy. MRI 10/27/23 showing Multiple acute infarcts in the right frontal, parietal and posterior temporal cortex as well as the adjacent subcortical white matter and punctate acute infarcts in the left basal ganglia, thalamus and overlying white matter  ESRD -  on HD MWF typically but has been off normal schedule. HD 11/02/23.  HTN/ volume  - BP elevated. Restarted Amlodipine  5 mg today. Trace edema on exam, UF with HD as tolerated  Anemia of CKD - Hgb 13.9. No Fe/ESA is indicated at this time. Secondary  hyperparathyroidism - Calcium  controlled. Continue calcitriol , sensipar  and renvela  Nutrition - Renal diet with fluid restriction, does not seem to be eating much  Ramona Burner, PA-C 11/02/2023, 10:18 AM   [facility] Pager: [phone]

## 2023-11-03 DIAGNOSIS — G9340 Encephalopathy, unspecified: Secondary | ICD-10-CM | POA: Diagnosis not present

## 2023-11-03 NOTE — Progress Notes (Signed)
 [facility] Progress Note   Subjective:   Had some asymptomatic hypotension on HD yesterday. Reports he feels fine today. Denies SOB, CP, dizziness, nausea.   Objective Vitals:   11/02/23 1954 11/02/23 2354 11/03/23 0423 11/03/23 0900  BP: (!) 135/93 (!) 143/93 122/79 (!) 136/102  Pulse: 82 81 79 84  Resp: 18 18 16 13   Temp: 98.9 F (37.2 C) 98.2 F (36.8 C) 97.8 F (36.6 C) 98 F (36.7 C)  TempSrc: Oral Oral Oral Oral  SpO2: 100% 99% 93% 94%  Weight:      Height:       Physical Exam General: alert male in NAD Heart: RRR, no murmurs, rubs or gallops Lungs:CTA bilaterally, respirations unlabored on RA Abdomen: Soft, non-distended, +BS Extremities: No edema b/l lower extremities Dialysis Access:  R internal jugular [facility]  Additional Objective Labs: Basic Metabolic Panel: Recent Labs  Lab 10/31/23 1005 11/01/23 0739 11/02/23 1114  NA 134* 132* 134*  K 3.9 4.7 4.7  CL 92* 93* 93*  CO2 26 22 23   GLUCOSE 83 72 83  BUN 49* 66* 80*  CREATININE 13.87* 17.56* 20.79*  CALCIUM  9.1 9.7 9.5  PHOS  --   --  9.5*   Liver Function Tests: Recent Labs  Lab 11/02/23 1114  ALBUMIN  3.5   No results for input(s): "LIPASE", "AMYLASE" in the last 168 hours. CBC: Recent Labs  Lab 10/28/23 0709 10/30/23 0547 10/31/23 1005 11/01/23 0739 11/02/23 1113  WBC 7.4 7.6 6.7 8.2 7.1  HGB 12.7* 13.3 14.0 13.9 12.4*  HCT 37.8* 39.5 41.4 40.3 36.0*  MCV 92.2 91.6 89.6 89.2 89.8  PLT 95* 73* 72* 68* 60*   Blood Culture    Component Value Date/Time   SDES BLOOD RIGHT HAND 01/19/2023 0213   SPECREQUEST  01/19/2023 4098    BOTTLES DRAWN AEROBIC AND ANAEROBIC Blood Culture results may not be optimal due to an excessive volume of blood received in culture bottles   CULT  01/19/2023 0213    NO GROWTH 5 DAYS Performed at [facility], [address], [place], [place] [address]    REPTSTATUS 01/24/2023 FINAL 01/19/2023 7829    Cardiac Enzymes: No results for  input(s): "CKTOTAL", "CKMB", "CKMBINDEX", "TROPONINI" in the last 168 hours. CBG: No results for input(s): "GLUCAP" in the last 168 hours. Iron  Studies: No results for input(s): "IRON ", "TIBC", "TRANSFERRIN", "FERRITIN" in the last 72 hours. @lablastinr3 @ Studies/Results: No results found. Medications:   amLODipine   5 mg Oral Daily   apixaban   5 mg Oral BID   atorvastatin   40 mg Oral Daily   calcitRIOL   2.5 mcg Oral Q M,W,F-HD   cinacalcet   150 mg Oral Q M,W,F-HD   divalproex   1,000 mg Oral Q12H   feeding supplement (NEPRO CARB STEADY)  237 mL Oral BID BM   heparin  sodium (porcine)  4,000 Units Intracatheter Once   levETIRAcetam   500 mg Oral BID   levETIRAcetam   500 mg Oral Q M,W,F-HD   multivitamin  1 tablet Oral QHS   sevelamer  carbonate  3,200 mg Oral TID WC   sodium chloride  flush  10-40 mL Intracatheter Q12H    Dialysis Orders: MWF [facility] 4h   B400   110.5kg  RIJ TDC   Heparin  none Mircera: none Calcitriol  2.5mcg PO qHD - last dose 11/01/23 Sensipar  150mg  with HD - last dose 11/01/23  Assessment/Plan: Seizure disorder - Neurology following; EEG suggestive of moderate diffuse encephalopathy, no seizures noted and MRI brain no acute changes.  Had seizures on HD 5/09. Is on keppra , depakote  and vimpat  now. Per neuro/ pmd.  Acute encephalopathy - doing better, still some deficits per neurology. CT clear, and EEG showing moderate diffuse encephalopathy. MRI 10/27/23 showing Multiple acute infarcts in the right frontal, parietal and posterior temporal cortex as well as the adjacent subcortical white matter and punctate acute infarcts in the left basal ganglia, thalamus and overlying white matter  ESRD -  on HD MWF typically but has been off normal schedule. Resume normal schedule with HD tomorrow.  HTN/ volume  - BP elevated. Restarted Amlodipine  5 mg. Appears euvolemic on exam, under his prior EDW.  Anemia of CKD - Hgb 12.4. No Fe/ESA is indicated at this time. Secondary  hyperparathyroidism - Calcium  controlled. Continue calcitriol , sensipar  and renvela  Nutrition - Renal diet with fluid restriction, does not seem to be eating much  Ramona Burner, PA-C 11/03/2023, 11:46 AM  [facility] Pager: [phone]

## 2023-11-03 NOTE — Progress Notes (Signed)
 PROGRESS NOTE [name] [name]  ION:[id number] DOB: 1980-06-23 DOA: 10/20/2023 PCP: Collins Dean, NP  Brief Narrative/Hospital Course: 43 y.o. male with history of ESRD on hemodialysis MWF, hypertension, prior stroke, history of seizures, lupus anticoagulant brought to the ER on 5/5 after patient had a seizure, encephalopathy. Neurology was consulted. Patient remains encephalopathic.  Had breakthrough seizure 5/9 while on HD, and moved to PCU and found to have new strokes in MRI  The patient has been seen by neurology and nephrology. EEG is suggestive of moderate diffuse encephalopathy.  Sharp transients were noted in the right frontal region. Per neurology ADDED ON ltm eeg for further evaluation. MRI on 5/6 showed no evidence of acute intracranial abnormality, and multiple remote infarcts.  PTOT following  Subjective Patient seen and examined Is resting comfortably Overnight events remains afebrile, BP stable at 120-143 systolic, on room air.  No acute events  Alert awake follows commands no agitation  Overall unchanged  Assessment and Plan:  Acute right MCA patchy infarct as well as left thalamic and caudate head punctuate infarct Likely embolic, secondary to antiphospholipid syndrome with underdosed Eliquis . Neurology following, hide MRI brain 5/6 no acute infarct, MRI brain 5/11 multiple acute infarcts- SEE REPORT. MRA showed patency of major intracranial arteries. A1c 5.6.LDL: 75.  HDL: 26.  Carotid duplex no significant finding TTE:EF 70% mitral valve calcified, restricted possible rheumatic disease, moderate MS mild MR Noncompliant anticoagulation at home on 2.5 ELIQUIS  BID Now changed to 5 mg bid Now on Lipitor 40. Cont aggressive stroke risk modification , PTOT W/ Plan for SNF  Acute encephalopathy Seizure disorder w/ breakthrough seizure on 10/25/23: Initially thought due to postictal state, but persisted. S.p MRI- most llikely that the encephalopathy is 2/2 strokes involving  the frontal region. Work up [employer]: WNL.HIV: Nonreactive.B12: 539 B1 nl, RPR nonreactive. B1: Within EEG notable for BL sharps as well as right frontal subclinical seizure.Off high-dose.  Mentation overall stable alert awake following commands appropriately not agitated.  Neurology signed off, continue on increased dose of Keppra  500 twice daily and 500 MWF, Depakote  1000 mg q12  ESRD on HD MWF: Secondary hyperparathyroidism/MBD Anemia of CKD Nephrology following continue HD- last HD Saturday  Continue to monitor calcium  close and hemoglobin intermittently. Continue Renvela , calcitriol  and Sensipar    Hypertension Stable. Off amlodipine  and Coreg    Functional impairment Debility/deconditioning: Continue PT/OT -recommending skilled nursing facility  Class I Obesity w/ Body mass index is 32.25 kg/m.: Will benefit with PCP follow-up, weight loss,healthy lifestyle and outpatient sleep eval if not done.  DVT prophylaxis:  Code Status:   Code Status: Full Code Family Communication: plan of care discussed with patient at bedside. Patient status is: Remains hospitalized because of severity of illness Level of care: Progressive   Dispo: The patient is from: home. Unmarried. Next of kin is [name] per him.            Anticipated disposition: snf pending Objective: Vitals last 24 hrs: Vitals:   11/02/23 1954 11/02/23 2354 11/03/23 0423 11/03/23 0900  BP: (!) 135/93 (!) 143/93 122/79 (!) 136/102  Pulse: 82 81 79 84  Resp: 18 18 16 13   Temp: 98.9 F (37.2 C) 98.2 F (36.8 C) 97.8 F (36.6 C) 98 F (36.7 C)  TempSrc: Oral Oral Oral Oral  SpO2: 100% 99% 93% 94%  Weight:      Height:        Physical Examination: General exam: alert awake, oriented x2 HEENT:Oral mucosa moist, Ear/Nose WNL grossly  Respiratory system: Bilaterally clear BS,no use of accessory muscle Cardiovascular system: S1 & S2 +, No JVD. Gastrointestinal system: Abdomen soft,NT,ND, BS+ Nervous  System: Alert, awake, moving all extremities,and following commands. Extremities: LE edema neg,distal peripheral pulses palpable and warm.  Skin: No rashes,no icterus. MSK: Normal muscle bulk,tone, power   Data Reviewed: I have personally reviewed following labs and imaging studies ( see epic result tab) CBC: Recent Labs  Lab 10/28/23 0709 10/30/23 0547 10/31/23 1005 11/01/23 0739 11/02/23 1113  WBC 7.4 7.6 6.7 8.2 7.1  HGB 12.7* 13.3 14.0 13.9 12.4*  HCT 37.8* 39.5 41.4 40.3 36.0*  MCV 92.2 91.6 89.6 89.2 89.8  PLT 95* 73* 72* 68* 60*   CMP: Recent Labs  Lab 10/29/23 0615 10/30/23 0547 10/31/23 1005 11/01/23 0739 11/02/23 1114  NA 136 135 134* 132* 134*  K 4.0 4.7 3.9 4.7 4.7  CL 92* 92* 92* 93* 93*  CO2 26 23 26 22 23   GLUCOSE 92 78 83 72 83  BUN 50* 65* 49* 66* 80*  CREATININE 15.60* 18.25* 13.87* 17.56* 20.79*  CALCIUM  9.0 9.3 9.1 9.7 9.5  PHOS  --   --   --   --  9.5*   GFR: Estimated Creatinine Clearance: 5.8 mL/min (A) (by C-G formula based on SCr of 20.79 mg/dL (H)). Recent Labs  Lab 11/02/23 1114  ALBUMIN  3.5    No results for input(s): "LIPASE", "AMYLASE" in the last 168 hours.  Recent Labs  Lab 10/31/23 1005  AMMONIA <13   Coagulation Profile: No results for input(s): "INR", "PROTIME" in the last 168 hours. Unresulted Labs (From admission, onward)    None      Antimicrobials/Microbiology: Anti-infectives (From admission, onward)    None         Component Value Date/Time   SDES BLOOD RIGHT HAND 01/19/2023 0213   SPECREQUEST  01/19/2023 4540    BOTTLES DRAWN AEROBIC AND ANAEROBIC Blood Culture results may not be optimal due to an excessive volume of blood received in culture bottles   CULT  01/19/2023 0213    NO GROWTH 5 DAYS Performed at [facility], [address], [place], [place] [address]    REPTSTATUS 01/24/2023 FINAL 01/19/2023 1478     Medications reviewed:  Scheduled Meds:  amLODipine   5 mg Oral Daily   apixaban   5  mg Oral BID   atorvastatin   40 mg Oral Daily   calcitRIOL   2.5 mcg Oral Q M,W,F-HD   cinacalcet   150 mg Oral Q M,W,F-HD   divalproex   1,000 mg Oral Q12H   feeding supplement (NEPRO CARB STEADY)  237 mL Oral BID BM   heparin  sodium (porcine)  4,000 Units Intracatheter Once   levETIRAcetam   500 mg Oral BID   levETIRAcetam   500 mg Oral Q M,W,F-HD   multivitamin  1 tablet Oral QHS   sevelamer  carbonate  3,200 mg Oral TID WC   sodium chloride  flush  10-40 mL Intracatheter Q12H   Continuous Infusions:  Lesa Rape, MD [facility] 11/03/2023, 11:09 AM

## 2023-11-03 NOTE — Plan of Care (Signed)
  Problem: Education: Goal: Knowledge of General Education information will improve Description: Including pain rating scale, medication(s)/side effects and non-pharmacologic comfort measures Outcome: Progressing   Problem: Health Behavior/Discharge Planning: Goal: Ability to manage health-related needs will improve Outcome: Progressing   Problem: Clinical Measurements: Goal: Will remain free from infection Outcome: Progressing   Problem: Clinical Measurements: Goal: Respiratory complications will improve Outcome: Progressing   Problem: Activity: Goal: Risk for activity intolerance will decrease Outcome: Progressing   Problem: Nutrition: Goal: Adequate nutrition will be maintained Outcome: Progressing   Problem: Coping: Goal: Level of anxiety will decrease Outcome: Progressing   Problem: Elimination: Goal: Will not experience complications related to bowel motility Outcome: Progressing   Problem: Safety: Goal: Ability to remain free from injury will improve Outcome: Progressing   Problem: Skin Integrity: Goal: Risk for impaired skin integrity will decrease Outcome: Progressing   Problem: Ischemic Stroke/TIA Tissue Perfusion: Goal: Complications of ischemic stroke/TIA will be minimized Outcome: Progressing

## 2023-11-04 DIAGNOSIS — G9340 Encephalopathy, unspecified: Secondary | ICD-10-CM | POA: Diagnosis not present

## 2023-11-04 LAB — RENAL FUNCTION PANEL
Albumin: 3.5 g/dL (ref 3.5–5.0)
Anion gap: 19 — ABNORMAL HIGH (ref 5–15)
BUN: 76 mg/dL — ABNORMAL HIGH (ref 6–20)
CO2: 23 mmol/L (ref 22–32)
Calcium: 10.3 mg/dL (ref 8.9–10.3)
Chloride: 90 mmol/L — ABNORMAL LOW (ref 98–111)
Creatinine, Ser: 18.12 mg/dL — ABNORMAL HIGH (ref 0.61–1.24)
GFR, Estimated: 3 mL/min — ABNORMAL LOW (ref >60)
Glucose, Bld: 77 mg/dL (ref 70–99)
Phosphorus: 9.5 mg/dL — ABNORMAL HIGH (ref 2.5–4.6)
Potassium: 4.6 mmol/L (ref 3.5–5.1)
Sodium: 132 mmol/L — ABNORMAL LOW (ref 135–145)

## 2023-11-04 MED ORDER — ALBUMIN HUMAN 25 % IV SOLN
25.0000 g | Freq: Once | INTRAVENOUS | Status: AC
  Administered 2023-11-04: 25 g via INTRAVENOUS

## 2023-11-04 MED ORDER — DIPHENHYDRAMINE HCL 25 MG PO CAPS
ORAL_CAPSULE | ORAL | Status: AC
  Filled 2023-11-04: qty 1

## 2023-11-04 MED ORDER — MIDODRINE HCL 5 MG PO TABS
10.0000 mg | ORAL_TABLET | Freq: Once | ORAL | Status: AC
  Administered 2023-11-04: 10 mg via ORAL

## 2023-11-04 MED ORDER — MIDODRINE HCL 5 MG PO TABS
ORAL_TABLET | ORAL | Status: AC
  Filled 2023-11-04: qty 2

## 2023-11-04 MED ORDER — MIDODRINE HCL 5 MG PO TABS
10.0000 mg | ORAL_TABLET | Freq: Once | ORAL | Status: AC
  Administered 2023-11-06: 10 mg via ORAL

## 2023-11-04 MED ORDER — HALOPERIDOL LACTATE 5 MG/ML IJ SOLN
5.0000 mg | Freq: Once | INTRAMUSCULAR | Status: AC | PRN
  Administered 2023-11-04: 5 mg via INTRAMUSCULAR
  Filled 2023-11-04: qty 1

## 2023-11-04 NOTE — Progress Notes (Signed)
 [facility] night cross cover note:  I was notified by RN that this patient is agitated, irritated, pulling at their telemetry leads and peripheral IV, removing peripheral IV, with these behaviors refractory to attempts at verbal redirection.  In the setting of associated interference with ongoing medical treatment posing potential harm to themself, I have placed order for Haldol  5 mg IM x 1 dose prn for agitation.    Camelia Cavalier, DO Hospitalist

## 2023-11-04 NOTE — Plan of Care (Signed)
  Problem: Education: Goal: Knowledge of General Education information will improve Description: Including pain rating scale, medication(s)/side effects and non-pharmacologic comfort measures Outcome: Progressing   Problem: Clinical Measurements: Goal: Will remain free from infection Outcome: Progressing   Problem: Activity: Goal: Risk for activity intolerance will decrease Outcome: Progressing   Problem: Nutrition: Goal: Adequate nutrition will be maintained Outcome: Progressing   Problem: Coping: Goal: Level of anxiety will decrease Outcome: Progressing   Problem: Elimination: Goal: Will not experience complications related to bowel motility Outcome: Progressing   Problem: Elimination: Goal: Will not experience complications related to urinary retention Outcome: Progressing   Problem: Safety: Goal: Ability to remain free from injury will improve Outcome: Progressing   Problem: Skin Integrity: Goal: Risk for impaired skin integrity will decrease Outcome: Progressing

## 2023-11-04 NOTE — Procedures (Signed)
 Seen and examined on dialysis.  Blood pressure 122/94 and HR 80.  Tolerating goal.  RIJ tunn catheter in use.  Procedure supervised.  Improved since admission per HD RN  Nan Aver, MD 11/04/2023  8:46 AM

## 2023-11-04 NOTE — Progress Notes (Signed)
 Patient pulled our IV

## 2023-11-04 NOTE — Progress Notes (Addendum)
 [facility] Progress Note   Subjective:   Patient seen on HD today. He has been having some asymptomatic hypotension during HD.  It was reported to me that his BP dropped during last HD treatment as well. He denies any dizziness of SOB. He was given midodrine  10 mg and albumin  25 g to help with his BP. We were unable to do UF. He reports to me that he normally takes midodrine  15 mg before his normal OP HD. Midodrine  ordered to be given before next HD.   Objective Vitals:   11/04/23 1000 11/04/23 1030 11/04/23 1100 11/04/23 1130  BP: 104/67 (!) 80/71 92/64 103/62  Pulse: 91 95 85 81  Resp: 17 16 10 12   Temp:      TempSrc:      SpO2: 97% 100% 99% 96%  Weight:      Height:       Physical Exam General: alert male in NAD Heart: RRR, no murmurs, rubs or gallops Lungs:CTA bilaterally, respirations unlabored on RA Abdomen: Soft, non-distended, +BS Extremities: sockline b/l lower extremities Dialysis Access:  R internal jugular [facility]  Additional Objective Labs: Basic Metabolic Panel: Recent Labs  Lab 11/01/23 0739 11/02/23 1114 11/04/23 0814  NA 132* 134* 132*  K 4.7 4.7 4.6  CL 93* 93* 90*  CO2 22 23 23   GLUCOSE 72 83 77  BUN 66* 80* 76*  CREATININE 17.56* 20.79* 18.12*  CALCIUM  9.7 9.5 10.3  PHOS  --  9.5* 9.5*   Liver Function Tests: Recent Labs  Lab 11/02/23 1114 11/04/23 0814  ALBUMIN  3.5 3.5   No results for input(s): "LIPASE", "AMYLASE" in the last 168 hours. CBC: Recent Labs  Lab 10/30/23 0547 10/31/23 1005 11/01/23 0739 11/02/23 1113  WBC 7.6 6.7 8.2 7.1  HGB 13.3 14.0 13.9 12.4*  HCT 39.5 41.4 40.3 36.0*  MCV 91.6 89.6 89.2 89.8  PLT 73* 72* 68* 60*   Blood Culture    Component Value Date/Time   SDES BLOOD RIGHT HAND 01/19/2023 0213   SPECREQUEST  01/19/2023 1478    BOTTLES DRAWN AEROBIC AND ANAEROBIC Blood Culture results may not be optimal due to an excessive volume of blood received in culture bottles   CULT  01/19/2023 0213     NO GROWTH 5 DAYS Performed at [facility], [address], [place], [place] [address]    REPTSTATUS 01/24/2023 FINAL 01/19/2023 1308    Cardiac Enzymes: No results for input(s): "CKTOTAL", "CKMB", "CKMBINDEX", "TROPONINI" in the last 168 hours. CBG: No results for input(s): "GLUCAP" in the last 168 hours. Iron  Studies: No results for input(s): "IRON ", "TIBC", "TRANSFERRIN", "FERRITIN" in the last 72 hours. @lablastinr3 @ Studies/Results: No results found. Medications:  albumin  human      amLODipine   5 mg Oral Daily   apixaban   5 mg Oral BID   atorvastatin   40 mg Oral Daily   calcitRIOL   2.5 mcg Oral Q M,W,F-HD   cinacalcet   150 mg Oral Q M,W,F-HD   divalproex   1,000 mg Oral Q12H   feeding supplement (NEPRO CARB STEADY)  237 mL Oral BID BM   levETIRAcetam   500 mg Oral BID   levETIRAcetam   500 mg Oral Q M,W,F-HD   multivitamin  1 tablet Oral QHS   sevelamer  carbonate  3,200 mg Oral TID WC   sodium chloride  flush  10-40 mL Intracatheter Q12H    Dialysis Orders: MWF [facility] 4h   B400   110.5kg  RIJ TDC   Heparin  none Mircera:  none Calcitriol  2.5mcg PO qHD - last dose 11/01/23 Sensipar  150mg  with HD - last dose 11/01/23  Assessment/Plan: Seizure disorder - Neurology following; EEG suggestive of moderate diffuse encephalopathy, no seizures noted and MRI brain no acute changes. Had seizures on HD 5/09. Is on keppra , depakote  and vimpat  now. Per neuro/ pmd.  Acute encephalopathy - doing better, still some deficits per neurology. CT clear, and EEG showing moderate diffuse encephalopathy. MRI 10/27/23 showing Multiple acute infarcts in the right frontal, parietal and posterior temporal cortex as well as the adjacent subcortical white matter and punctate acute infarcts in the left basal ganglia, thalamus and overlying white matter  ESRD -  on HD MWF typically but has been off normal schedule. Resume normal schedule with HD today.  HTN/ volume  - Patient having asymptomatic hypotension.  Given midodrine  and albumin  during treatment. Unable to tolerated UF today.  Appears euvolemic on exam, under his prior EDW.  Anemia of CKD - Hgb 12.4. No Fe/ESA is indicated at this time. Secondary hyperparathyroidism - Calcium  controlled. Phos above goal. Continue calcitriol , sensipar  and renvela  Nutrition - Renal diet with fluid restriction, does not seem to be eating much  Hersey Lorenzo, NP-C 11/04/2023, 11:51 AM  [facility]   Seen and examined independently.  Agree with note and exam as documented above by Hersey Lorenzo, NP and as noted here.  See also my procedure note from today  Nan Aver, MD 11/04/2023  2:39 PM

## 2023-11-04 NOTE — Progress Notes (Signed)
   11/04/23 1214  Vitals  Temp 97.8 F (36.6 C)  Pulse Rate 79  Resp 17  BP 109/74  SpO2 99 %  O2 Device Room Air  Post Treatment  Dialyzer Clearance Clear  Hemodialysis Intake (mL) 400 mL  Liters Processed 83.9  Fluid Removed (mL) 100 mL  Tolerated HD Treatment Yes  Post-Hemodialysis Comments Pt became hypotensive during treatment, pt received four 100ml ns boluses, uf turned off and NP-DIckinson notified. NP-Dickinson ordered 25gram of albumin  which was administered to pt. Pt blood pressure increased but UF was not turned back on due to blood pressure values for remainder of treatment.   Received patient in bed to unit.  Alert and oriented.  Informed consent signed and in chart.   TX duration: Three hours and thirty minutes  Patient tolerated well.  Transported back to the room  Alert, without acute distress.  Hand-off given to patient's nurse.   Access used: Right HD catheter Access issues: None Medication(s) given: 25grams of albumin , 25mg  of benadryl  PO

## 2023-11-04 NOTE — Progress Notes (Signed)
 PROGRESS NOTE [name] [name]  ZOX:[id number] DOB: 04-06-1981 DOA: 10/20/2023 PCP: Collins Dean, NP  Brief Narrative/Hospital Course: 43 y.o. male with history of ESRD on hemodialysis MWF, hypertension, prior stroke, history of seizures, lupus anticoagulant brought to the ER on 5/5 after patient had a seizure, encephalopathy. Neurology was consulted. Patient remains encephalopathic.  Had breakthrough seizure 5/9 while on HD, and moved to PCU and found to have new strokes in MRI  The patient has been seen by neurology and nephrology. EEG is suggestive of moderate diffuse encephalopathy.  Sharp transients were noted in the right frontal region. Per neurology ADDED ON ltm eeg for further evaluation. MRI on 5/6 showed no evidence of acute intracranial abnormality, and multiple remote infarcts.  PTOT following  Subjective Patient seen and examined in dialysis He is alert awake Overnight afebrile blood pressure stable 120s-130s, on room air  Neurological status unchanged alert awake follows command, slow to respond  per dialysis nurse needed to give fluid boluses for hypotension during treatment  Assessment and Plan:  Acute right MCA patchy infarct as well as left thalamic and caudate head punctuate infarct Likely embolic, secondary to antiphospholipid syndrome with underdosed Eliquis . Neurology following, hide MRI brain 5/6 no acute infarct, MRI brain 5/11 multiple acute infarcts- SEE REPORT. MRA showed patency of major intracranial arteries. A1c 5.6.LDL: 75.  HDL: 26.  Carotid duplex no significant finding TTE:EF 70% mitral valve calcified, restricted possible rheumatic disease, moderate MS mild MR Noncompliant anticoagulation at home on 2.5 ELIQUIS  BID Now changed to 5 mg bid Now on Lipitor 40. Cont aggressive stroke risk modification , PTOT W/ Plan for SNF  Acute encephalopathy Seizure disorder w/ breakthrough seizure on 10/25/23: Initially thought due to postictal state, but persisted.  S.p MRI- most llikely that the encephalopathy is 2/2 strokes involving the frontal region. Work up [employer]: WNL.HIV: Nonreactive.B12: 539 B1 nl, RPR nonreactive. B1: Within EEG notable for BL sharps as well as right frontal subclinical seizure.Off high-dose.  Mentation overall stable alert awake following commands appropriately not agitated.  Neurology signed off, continue on increased dose of Keppra  500 twice daily and 500 MWF, Depakote  1000 mg q12  ESRD on HD MWF: Secondary hyperparathyroidism/MBD Anemia of CKD Nephrology following continue HD- HD 5/19 Continue to monitor calcium  close and hemoglobin intermittently. Continue Renvela , calcitriol  and Sensipar    Hypertension Stable. Off amlodipine  and Coreg    Functional impairment Debility/deconditioning: Continue PT/OT -recommending skilled nursing facility  Class I Obesity w/ Body mass index is 31.42 kg/m.: Will benefit with PCP follow-up, weight loss,healthy lifestyle and outpatient sleep eval if not done.  DVT prophylaxis:  Code Status:   Code Status: Full Code Family Communication: plan of care discussed with patient at bedside. Patient status is: Remains hospitalized because of severity of illness Level of care: Progressive   Dispo: The patient is from: home. Unmarried. Next of kin is [name] per him.            Anticipated disposition: snf pending Objective: Vitals last 24 hrs: Vitals:   11/04/23 1130 11/04/23 1200 11/04/23 1214 11/04/23 1329  BP: 103/62 (!) 97/58 109/74 (!) 140/89  Pulse: 81 80 79 68  Resp: 12 12 17 20   Temp:   97.8 F (36.6 C) 98.5 F (36.9 C)  TempSrc:    Oral  SpO2: 96% 99% 99% 99%  Weight:      Height:        Physical Examination: General exam: alert awake, oriented  HEENT:Oral mucosa moist, Ear/Nose WNL  grossly Respiratory system: Bilaterally clear BS,no use of accessory muscle Cardiovascular system: S1 & S2 +, No JVD. Gastrointestinal system: Abdomen soft,NT,ND,  BS+ Nervous System: Alert, awake, moving all extremities,and following commands. Extremities: LE edema neg,distal peripheral pulses palpable and warm.  Skin: No rashes,no icterus. MSK: Normal muscle bulk,tone, power   Data Reviewed: I have personally reviewed following labs and imaging studies ( see epic result tab) CBC: Recent Labs  Lab 10/30/23 0547 10/31/23 1005 11/01/23 0739 11/02/23 1113  WBC 7.6 6.7 8.2 7.1  HGB 13.3 14.0 13.9 12.4*  HCT 39.5 41.4 40.3 36.0*  MCV 91.6 89.6 89.2 89.8  PLT 73* 72* 68* 60*   CMP: Recent Labs  Lab 10/30/23 0547 10/31/23 1005 11/01/23 0739 11/02/23 1114 11/04/23 0814  NA 135 134* 132* 134* 132*  K 4.7 3.9 4.7 4.7 4.6  CL 92* 92* 93* 93* 90*  CO2 23 26 22 23 23   GLUCOSE 78 83 72 83 77  BUN 65* 49* 66* 80* 76*  CREATININE 18.25* 13.87* 17.56* 20.79* 18.12*  CALCIUM  9.3 9.1 9.7 9.5 10.3  PHOS  --   --   --  9.5* 9.5*   GFR: Estimated Creatinine Clearance: 6.5 mL/min (A) (by C-G formula based on SCr of 18.12 mg/dL (H)). Recent Labs  Lab 11/02/23 1114 11/04/23 0814  ALBUMIN  3.5 3.5    No results for input(s): "LIPASE", "AMYLASE" in the last 168 hours.  Recent Labs  Lab 10/31/23 1005  AMMONIA <13   Coagulation Profile: No results for input(s): "INR", "PROTIME" in the last 168 hours. Unresulted Labs (From admission, onward)     Start     Ordered   Signed and Held  Renal function panel  Once,   R       Question:  Specimen collection method  Answer:  Lab=Lab collect   Signed and Held   Signed and Held  CBC  Once,   R       Question:  Specimen collection method  Answer:  Lab=Lab collect   Signed and Held           Antimicrobials/Microbiology: Anti-infectives (From admission, onward)    None         Component Value Date/Time   SDES BLOOD RIGHT HAND 01/19/2023 0213   SPECREQUEST  01/19/2023 0981    BOTTLES DRAWN AEROBIC AND ANAEROBIC Blood Culture results may not be optimal due to an excessive volume of blood received  in culture bottles   CULT  01/19/2023 0213    NO GROWTH 5 DAYS Performed at [facility], [address] [place], [place] [address]    REPTSTATUS 01/24/2023 FINAL 01/19/2023 8295     Medications reviewed:  Scheduled Meds:  amLODipine   5 mg Oral Daily   apixaban   5 mg Oral BID   atorvastatin   40 mg Oral Daily   calcitRIOL   2.5 mcg Oral Q M,W,F-HD   cinacalcet   150 mg Oral Q M,W,F-HD   divalproex   1,000 mg Oral Q12H   feeding supplement (NEPRO CARB STEADY)  237 mL Oral BID BM   levETIRAcetam   500 mg Oral BID   levETIRAcetam   500 mg Oral Q M,W,F-HD   [START ON 11/06/2023] midodrine   10 mg Oral Once in dialysis   multivitamin  1 tablet Oral QHS   sevelamer  carbonate  3,200 mg Oral TID WC   sodium chloride  flush  10-40 mL Intracatheter Q12H   Continuous Infusions:  Lesa Rape, MD [facility] 11/04/2023, 3:21 PM

## 2023-11-05 DIAGNOSIS — G9340 Encephalopathy, unspecified: Secondary | ICD-10-CM | POA: Diagnosis not present

## 2023-11-05 MED ORDER — CHLORHEXIDINE GLUCONATE CLOTH 2 % EX PADS
6.0000 | MEDICATED_PAD | Freq: Every day | CUTANEOUS | Status: DC
  Administered 2023-11-06 – 2023-11-08 (×2): 6 via TOPICAL

## 2023-11-05 NOTE — Progress Notes (Signed)
 Physical Therapy Treatment Patient Details Name: [name] [name] MRN: [id number] DOB: May 02, 1981 Today's Date: 11/05/2023   History of Present Illness Patient is a 43 year old male with witnessed seizure at dialysis on 10/21/2023. Patient leaving AMA, and returning a few hours later via [facility]. CT clear, and EEG showing moderate diffuse encephalopathy. MRI 10/27/23 showing Multiple acute infarcts in the right frontal, parietal and posterior temporal cortex as well as the adjacent subcortical white matter and punctate acute infarcts in the left basal ganglia, thalamus and overlying white matter. PMH: ESRD on HD, antiphospholipid syndrome, HTN, seizure, stroke.    PT Comments  Pt with fair tolerance to treatment today. Pt very lethargic today and was able to sit up in bed with supervision however refused further mobility stating that "he didn't feel like it" despite max encouragement and cues for sequencing. No change in DC/DME recs at this time. PT will continue to follow.     If plan is discharge home, recommend the following: Assistance with cooking/housework;Supervision due to cognitive status;Help with stairs or ramp for entrance;A little help with walking and/or transfers   Can travel by private vehicle     No  Equipment Recommendations  Rolling walker (2 wheels);BSC/3in1    Recommendations for Other Services       Precautions / Restrictions Precautions Precautions: Fall Recall of Precautions/Restrictions: Intact Restrictions Weight Bearing Restrictions Per Provider Order: No     Mobility  Bed Mobility Overal bed mobility: Needs Assistance Bed Mobility: Supine to Sit, Sit to Supine     Supine to sit: Supervision Sit to supine: Supervision   General bed mobility comments: Pt was able to sit EOB however refused further mobility due to lethargy.    Transfers                   General transfer comment: Pt declined    Ambulation/Gait                    Stairs             Wheelchair Mobility     Tilt Bed    Modified Rankin (Stroke Patients Only) Modified Rankin (Stroke Patients Only) Pre-Morbid Rankin Score: Slight disability Modified Rankin: Moderately severe disability     Balance Overall balance assessment: Needs assistance Sitting-balance support: Feet supported Sitting balance-Leahy Scale: Fair                                      Hotel manager: Impaired Factors Affecting Communication: Reduced clarity of speech  Cognition Arousal: Lethargic Behavior During Therapy: Flat affect                           PT - Cognition Comments: Minimally verbal with increased time for motor planning. Constant cues for sequencing. Following commands: Impaired Following commands impaired: Follows one step commands inconsistently, Follows one step commands with increased time    Cueing Cueing Techniques: Verbal cues, Tactile cues, Visual cues, Gestural cues  Exercises      General Comments General comments (skin integrity, edema, etc.): VSS      Pertinent Vitals/Pain Pain Assessment Pain Assessment: No/denies pain    Home Living                          Prior Function  PT Goals (current goals can now be found in the care plan section) Progress towards PT goals: Progressing toward goals    Frequency    Min 2X/week      PT Plan      Co-evaluation              AM-PAC PT "6 Clicks" Mobility   Outcome Measure  Help needed turning from your back to your side while in a flat bed without using bedrails?: A Little Help needed moving from lying on your back to sitting on the side of a flat bed without using bedrails?: A Little Help needed moving to and from a bed to a chair (including a wheelchair)?: A Little Help needed standing up from a chair using your arms (e.g., wheelchair or bedside chair)?: A Little Help needed to walk  in hospital room?: A Little Help needed climbing 3-5 steps with a railing? : A Lot 6 Click Score: 17    End of Session Equipment Utilized During Treatment: Gait belt Activity Tolerance: Patient limited by fatigue;Patient limited by lethargy Patient left: in bed;with call bell/phone within reach;with bed alarm set Nurse Communication: Mobility status PT Visit Diagnosis: Muscle weakness (generalized) (M62.81);Unsteadiness on feet (R26.81)     Time: 7829-5621 PT Time Calculation (min) (ACUTE ONLY): 8 min  Charges:    $Therapeutic Activity: 8-22 mins PT General Charges $$ ACUTE PT VISIT: 1 Visit                     Reyes Aldaco B, PT, DPT Acute Rehab Services [phone]    Athan Casalino 11/05/2023, 3:52 PM

## 2023-11-05 NOTE — Progress Notes (Signed)
 PROGRESS NOTE [name] [name]  ZOX:[id number] DOB: 08/12/1980 DOA: 10/20/2023 PCP: Collins Dean, NP  Brief Narrative/Hospital Course: 43 y.o. male with history of ESRD on hemodialysis MWF, hypertension, prior stroke, history of seizures, lupus anticoagulant brought to the ER on 5/5 after patient had a seizure, encephalopathy. Neurology was consulted. Patient remains encephalopathic.  Had breakthrough seizure 5/9 while on HD, and moved to PCU and found to have new strokes in MRI  The patient has been seen by neurology and nephrology. EEG is suggestive of moderate diffuse encephalopathy.  Sharp transients were noted in the right frontal region. Per neurology ADDED ON ltm eeg for further evaluation. MRI on 5/6 showed no evidence of acute intracranial abnormality, and multiple remote infarcts.  PTOT following  Subjective Seen and examined No new complaints neurological exam alert awake follows commands Overnight afebrile BP stable 127  Assessment and Plan:  Acute right MCA patchy infarct as well as left thalamic and caudate head punctuate infarct Likely embolic, secondary to antiphospholipid syndrome with underdosed Eliquis . Neurology following, hide MRI brain 5/6 no acute infarct, MRI brain 5/11 multiple acute infarcts- SEE REPORT. MRA showed patency of major intracranial arteries. A1c 5.6.LDL: 75.  HDL: 26.  Carotid duplex no significant finding TTE:EF 70% mitral valve calcified, restricted possible rheumatic disease, moderate MS mild MR Noncompliant anticoagulation at home on 2.5 ELIQUIS  BID Now changed to 5 mg bid Now on Lipitor 40. Cont aggressive stroke risk modification , PTOT W/ Plan for SNF  Acute encephalopathy Seizure disorder w/ breakthrough seizure on 10/25/23: Initially thought due to postictal state, but persisted. S.p MRI- most llikely that the encephalopathy is 2/2 strokes involving the frontal region. Work up [employer]: WNL.HIV: Nonreactive.B12: 539 B1 nl, RPR  nonreactive. B1: Within EEG notable for BL sharps as well as right frontal subclinical seizure.Off high-dose.  Mentation overall stable alert awake following commands appropriately not agitated.  Neurology signed off, continue on increased dose of Keppra  500 twice daily and 500 MWF, Depakote  1000 mg q12  ESRD on HD MWF: Secondary hyperparathyroidism/MBD Anemia of CKD Nephrology following continue HD- last HD 5/19 needing fluid bolus Continue to monitor calcium  close and hemoglobin intermittently. Continue Renvela , calcitriol  and Sensipar    Hypertension Stable. Off amlodipine  and Coreg    Functional impairment Debility/deconditioning: Continue PT/OT -recommending skilled nursing facility  Class I Obesity w/ Body mass index is 31.42 kg/m.: Will benefit with PCP follow-up, weight loss,healthy lifestyle and outpatient sleep eval if not done.  DVT prophylaxis:  Code Status:   Code Status: Full Code Family Communication: plan of care discussed with patient at bedside. Patient status is: Remains hospitalized because of severity of illness Level of care: Progressive   Dispo: The patient is from: home. Unmarried. Next of kin is [name] per him.            Anticipated disposition: snf pending Objective: Vitals last 24 hrs: Vitals:   11/04/23 1937 11/05/23 0000 11/05/23 0409 11/05/23 1030  BP: (!) 118/91 111/76 127/83 (!) 164/87  Pulse: 65 69 75 69  Resp: 16 16 16    Temp: 98 F (36.7 C) 98 F (36.7 C) 97.6 F (36.4 C) 97.8 F (36.6 C)  TempSrc: Oral Oral Oral Oral  SpO2: 100% 100% 100% 93%  Weight:      Height:        Physical Examination: General exam: alert awake, oriented at baseline, older than stated age HEENT:Oral mucosa moist, Ear/Nose WNL grossly Respiratory system: Bilaterally clear BS,no use of accessory muscle Cardiovascular  system: S1 & S2 +, No JVD. Gastrointestinal system: Abdomen soft,NT,ND, BS+ Nervous System: Alert, awake, moving all extremities,and  following commands. Extremities: LE edema neg,distal peripheral pulses palpable and warm.  Skin: No rashes,no icterus. MSK: Normal muscle bulk,tone, power   Data Reviewed: I have personally reviewed following labs and imaging studies ( see epic result tab) CBC: Recent Labs  Lab 10/30/23 0547 10/31/23 1005 11/01/23 0739 11/02/23 1113  WBC 7.6 6.7 8.2 7.1  HGB 13.3 14.0 13.9 12.4*  HCT 39.5 41.4 40.3 36.0*  MCV 91.6 89.6 89.2 89.8  PLT 73* 72* 68* 60*   CMP: Recent Labs  Lab 10/30/23 0547 10/31/23 1005 11/01/23 0739 11/02/23 1114 11/04/23 0814  NA 135 134* 132* 134* 132*  K 4.7 3.9 4.7 4.7 4.6  CL 92* 92* 93* 93* 90*  CO2 23 26 22 23 23   GLUCOSE 78 83 72 83 77  BUN 65* 49* 66* 80* 76*  CREATININE 18.25* 13.87* 17.56* 20.79* 18.12*  CALCIUM  9.3 9.1 9.7 9.5 10.3  PHOS  --   --   --  9.5* 9.5*   GFR: Estimated Creatinine Clearance: 6.5 mL/min (A) (by C-G formula based on SCr of 18.12 mg/dL (H)). Recent Labs  Lab 11/02/23 1114 11/04/23 0814  ALBUMIN  3.5 3.5    No results for input(s): "LIPASE", "AMYLASE" in the last 168 hours.  Recent Labs  Lab 10/31/23 1005  AMMONIA <13   Coagulation Profile: No results for input(s): "INR", "PROTIME" in the last 168 hours. Unresulted Labs (From admission, onward)     Start     Ordered   11/06/23 0500  Renal function panel  Daily,   R     Question:  Specimen collection method  Answer:  Lab=Lab collect   11/05/23 0834   11/06/23 0500  CBC  Tomorrow morning,   R       Question:  Specimen collection method  Answer:  Lab=Lab collect   11/05/23 0834   Signed and Held  Renal function panel  Once,   R       Question:  Specimen collection method  Answer:  Lab=Lab collect   Signed and Held   Signed and Held  CBC  Once,   R       Question:  Specimen collection method  Answer:  Lab=Lab collect   Signed and Held           Antimicrobials/Microbiology: Anti-infectives (From admission, onward)    None         Component Value  Date/Time   SDES BLOOD RIGHT HAND 01/19/2023 0213   SPECREQUEST  01/19/2023 8295    BOTTLES DRAWN AEROBIC AND ANAEROBIC Blood Culture results may not be optimal due to an excessive volume of blood received in culture bottles   CULT  01/19/2023 0213    NO GROWTH 5 DAYS Performed at [facility], [address] [place], [place] [address]    REPTSTATUS 01/24/2023 FINAL 01/19/2023 8657     Medications reviewed:  Scheduled Meds:  amLODipine   5 mg Oral Daily   apixaban   5 mg Oral BID   atorvastatin   40 mg Oral Daily   calcitRIOL   2.5 mcg Oral Q M,W,F-HD   cinacalcet   150 mg Oral Q M,W,F-HD   divalproex   1,000 mg Oral Q12H   feeding supplement (NEPRO CARB STEADY)  237 mL Oral BID BM   levETIRAcetam   500 mg Oral BID   levETIRAcetam   500 mg Oral Q M,W,F-HD   [START  ON 11/06/2023] midodrine   10 mg Oral Once in dialysis   multivitamin  1 tablet Oral QHS   sevelamer  carbonate  3,200 mg Oral TID WC   sodium chloride  flush  10-40 mL Intracatheter Q12H   Continuous Infusions:  Lesa Rape, MD [facility] 11/05/2023, 11:59 AM

## 2023-11-05 NOTE — Plan of Care (Signed)
   Problem: Activity: Goal: Risk for activity intolerance will decrease Outcome: Progressing   Problem: Nutrition: Goal: Adequate nutrition will be maintained Outcome: Progressing

## 2023-11-05 NOTE — Plan of Care (Signed)
  Problem: Education: Goal: Knowledge of General Education information will improve Description: Including pain rating scale, medication(s)/side effects and non-pharmacologic comfort measures Outcome: Not Progressing   Problem: Health Behavior/Discharge Planning: Goal: Ability to manage health-related needs will improve Outcome: Not Progressing   Problem: Clinical Measurements: Goal: Ability to maintain clinical measurements within normal limits will improve Outcome: Not Progressing Goal: Will remain free from infection Outcome: Not Progressing Goal: Diagnostic test results will improve Outcome: Not Progressing Goal: Respiratory complications will improve Outcome: Not Progressing Goal: Cardiovascular complication will be avoided Outcome: Not Progressing   Problem: Activity: Goal: Risk for activity intolerance will decrease Outcome: Not Progressing   Problem: Nutrition: Goal: Adequate nutrition will be maintained Outcome: Not Progressing   Problem: Coping: Goal: Level of anxiety will decrease Outcome: Not Progressing   Problem: Elimination: Goal: Will not experience complications related to bowel motility Outcome: Not Progressing Goal: Will not experience complications related to urinary retention Outcome: Not Progressing   Problem: Pain Managment: Goal: General experience of comfort will improve and/or be controlled Outcome: Not Progressing   Problem: Safety: Goal: Ability to remain free from injury will improve Outcome: Not Progressing   Problem: Skin Integrity: Goal: Risk for impaired skin integrity will decrease Outcome: Not Progressing   Problem: Education: Goal: Knowledge of disease and its progression will improve Outcome: Not Progressing Goal: Individualized Educational Video(s) Outcome: Not Progressing   Problem: Fluid Volume: Goal: Compliance with measures to maintain balanced fluid volume will improve Outcome: Not Progressing   Problem: Health  Behavior/Discharge Planning: Goal: Ability to manage health-related needs will improve Outcome: Not Progressing   Problem: Nutritional: Goal: Ability to make healthy dietary choices will improve Outcome: Not Progressing   Problem: Clinical Measurements: Goal: Complications related to the disease process, condition or treatment will be avoided or minimized Outcome: Not Progressing   Problem: Education: Goal: Knowledge of disease or condition will improve Outcome: Not Progressing Goal: Knowledge of secondary prevention will improve (MUST DOCUMENT ALL) Outcome: Not Progressing Goal: Knowledge of patient specific risk factors will improve (DELETE if not current risk factor) Outcome: Not Progressing   Problem: Ischemic Stroke/TIA Tissue Perfusion: Goal: Complications of ischemic stroke/TIA will be minimized Outcome: Not Progressing   Problem: Coping: Goal: Will verbalize positive feelings about self Outcome: Not Progressing Goal: Will identify appropriate support needs Outcome: Not Progressing   Problem: Health Behavior/Discharge Planning: Goal: Ability to manage health-related needs will improve Outcome: Not Progressing Goal: Goals will be collaboratively established with patient/family Outcome: Not Progressing   Problem: Self-Care: Goal: Ability to participate in self-care as condition permits will improve Outcome: Not Progressing Goal: Verbalization of feelings and concerns over difficulty with self-care will improve Outcome: Not Progressing Goal: Ability to communicate needs accurately will improve Outcome: Not Progressing   Problem: Nutrition: Goal: Risk of aspiration will decrease Outcome: Not Progressing Goal: Dietary intake will improve Outcome: Not Progressing

## 2023-11-05 NOTE — Progress Notes (Addendum)
 [facility] Progress Note   Subjective:   Patient seen in his room today. He denies any dyspnea or CP. Last HD 11/04/23. Plan for HD on 11/06/23. No complaints or concerns today. Patient on room air and in NAD.   Objective Vitals:   11/05/23 0000 11/05/23 0409 11/05/23 1030 11/05/23 1201  BP: 111/76 127/83 (!) 164/87 100/67  Pulse: 69 75 69 64  Resp: 16 16  18   Temp: 98 F (36.7 C) 97.6 F (36.4 C) 97.8 F (36.6 C) (!) 97.5 F (36.4 C)  TempSrc: Oral Oral Oral Axillary  SpO2: 100% 100% 93% 94%  Weight:      Height:       Physical Exam General: alert male in NAD Heart: RRR, no murmurs, rubs or gallops Lungs:CTA bilaterally, respirations unlabored on RA Abdomen: Soft, non-distended, +BS Extremities: sockline edema b/l lower extremities Dialysis Access:  R internal jugular [facility]  Additional Objective Labs: Basic Metabolic Panel: Recent Labs  Lab 11/01/23 0739 11/02/23 1114 11/04/23 0814  NA 132* 134* 132*  K 4.7 4.7 4.6  CL 93* 93* 90*  CO2 22 23 23   GLUCOSE 72 83 77  BUN 66* 80* 76*  CREATININE 17.56* 20.79* 18.12*  CALCIUM  9.7 9.5 10.3  PHOS  --  9.5* 9.5*   Liver Function Tests: Recent Labs  Lab 11/02/23 1114 11/04/23 0814  ALBUMIN  3.5 3.5   No results for input(s): "LIPASE", "AMYLASE" in the last 168 hours. CBC: Recent Labs  Lab 10/30/23 0547 10/31/23 1005 11/01/23 0739 11/02/23 1113  WBC 7.6 6.7 8.2 7.1  HGB 13.3 14.0 13.9 12.4*  HCT 39.5 41.4 40.3 36.0*  MCV 91.6 89.6 89.2 89.8  PLT 73* 72* 68* 60*   Blood Culture    Component Value Date/Time   SDES BLOOD RIGHT HAND 01/19/2023 0213   SPECREQUEST  01/19/2023 1610    BOTTLES DRAWN AEROBIC AND ANAEROBIC Blood Culture results may not be optimal due to an excessive volume of blood received in culture bottles   CULT  01/19/2023 0213    NO GROWTH 5 DAYS Performed at [facility], [address], [place], [place] [address]    REPTSTATUS 01/24/2023 FINAL 01/19/2023 4098     Cardiac Enzymes: No results for input(s): "CKTOTAL", "CKMB", "CKMBINDEX", "TROPONINI" in the last 168 hours. CBG: No results for input(s): "GLUCAP" in the last 168 hours. Iron  Studies: No results for input(s): "IRON ", "TIBC", "TRANSFERRIN", "FERRITIN" in the last 72 hours. @lablastinr3 @ Studies/Results: No results found. Medications:    amLODipine   5 mg Oral Daily   apixaban   5 mg Oral BID   atorvastatin   40 mg Oral Daily   calcitRIOL   2.5 mcg Oral Q M,W,F-HD   cinacalcet   150 mg Oral Q M,W,F-HD   divalproex   1,000 mg Oral Q12H   feeding supplement (NEPRO CARB STEADY)  237 mL Oral BID BM   levETIRAcetam   500 mg Oral BID   levETIRAcetam   500 mg Oral Q M,W,F-HD   [START ON 11/06/2023] midodrine   10 mg Oral Once in dialysis   multivitamin  1 tablet Oral QHS   sevelamer  carbonate  3,200 mg Oral TID WC   sodium chloride  flush  10-40 mL Intracatheter Q12H    Dialysis Orders: MWF [facility] 4h   B400   110.5kg  RIJ TDC   Heparin  none Mircera: none Calcitriol  2.5mcg PO qHD - last dose 11/01/23 Sensipar  150mg  with HD - last dose 11/01/23  Assessment/Plan: Seizure disorder - Neurology following; EEG suggestive of  moderate diffuse encephalopathy, no seizures noted and MRI brain no acute changes. Had seizures on HD 5/09. Is on keppra , depakote  and vimpat  now. Per neuro/ pmd.  Acute encephalopathy - doing better, still some deficits per neurology. CT clear, and EEG showing moderate diffuse encephalopathy. MRI 10/27/23 showing Multiple acute infarcts in the right frontal, parietal and posterior temporal cortex as well as the adjacent subcortical white matter and punctate acute infarcts in the left basal ganglia, thalamus and overlying white matter  ESRD -  on HD MWF typically but has been off normal schedule. Resume normal schedule with HD today.  HTN/ volume  - Patients BP is acceptable today.  Appears euvolemic on exam, under his prior EDW.  Anemia of CKD - Hgb 12.4. No Fe/ESA is indicated at  this time. Secondary hyperparathyroidism - Calcium  controlled. Phos above goal. Continue calcitriol , sensipar  and renvela  Nutrition - Renal diet with fluid restriction, does not seem to be eating much  Hersey Lorenzo, NP-C 11/05/2023, 12:45 PM  [facility]   Seen and examined independently this AM.  Agree with note and exam as documented above by physician extender and as noted here.  General adult male in bed in no acute distress HEENT normocephalic atraumatic extraocular movements intact sclera anicteric Neck supple trachea midline Lungs clear to auscultation bilaterally normal work of breathing at rest  Heart S1S2 no rub Abdomen soft nontender nondistended Extremities no edema  Psych normal mood and affect Neuro oriented to person, year is 2015, hospital/[facility] Access RIJ tunn catheter old AVF LUE no bruit  ESRD - on HD per MWF schedule  Encephalopathy - improved but still present   Anemia of CKD - no ESA indicated   Disposition per primary team   Nan Aver, MD 11/05/2023  5:36 PM

## 2023-11-06 DIAGNOSIS — G9340 Encephalopathy, unspecified: Secondary | ICD-10-CM | POA: Diagnosis not present

## 2023-11-06 LAB — RENAL FUNCTION PANEL
Albumin: 3.9 g/dL (ref 3.5–5.0)
Anion gap: 18 — ABNORMAL HIGH (ref 5–15)
BUN: 64 mg/dL — ABNORMAL HIGH (ref 6–20)
CO2: 22 mmol/L (ref 22–32)
Calcium: 10.2 mg/dL (ref 8.9–10.3)
Chloride: 91 mmol/L — ABNORMAL LOW (ref 98–111)
Creatinine, Ser: 15.96 mg/dL — ABNORMAL HIGH (ref 0.61–1.24)
GFR, Estimated: 3 mL/min — ABNORMAL LOW (ref >60)
Glucose, Bld: 112 mg/dL — ABNORMAL HIGH (ref 70–99)
Phosphorus: 7.8 mg/dL — ABNORMAL HIGH (ref 2.5–4.6)
Potassium: 4.3 mmol/L (ref 3.5–5.1)
Sodium: 131 mmol/L — ABNORMAL LOW (ref 135–145)

## 2023-11-06 LAB — CBC
HCT: 39 % (ref 39.0–52.0)
Hemoglobin: 13.2 g/dL (ref 13.0–17.0)
MCH: 30.6 pg (ref 26.0–34.0)
MCHC: 33.8 g/dL (ref 30.0–36.0)
MCV: 90.3 fL (ref 80.0–100.0)
Platelets: 72 10*3/uL — ABNORMAL LOW (ref 150–400)
RBC: 4.32 MIL/uL (ref 4.22–5.81)
RDW: 13.3 % (ref 11.5–15.5)
WBC: 6.9 10*3/uL (ref 4.0–10.5)
nRBC: 0 % (ref 0.0–0.2)

## 2023-11-06 MED ORDER — HEPARIN SODIUM (PORCINE) 1000 UNIT/ML IJ SOLN
INTRAMUSCULAR | Status: AC
  Filled 2023-11-06: qty 4

## 2023-11-06 MED ORDER — AMLODIPINE BESYLATE 2.5 MG PO TABS: 2.5000 mg | ORAL_TABLET | Freq: Every day | ORAL | Status: DC

## 2023-11-06 MED ORDER — MIDODRINE HCL 5 MG PO TABS
ORAL_TABLET | ORAL | Status: AC
Start: 2023-11-06 — End: ?
  Filled 2023-11-06: qty 2

## 2023-11-06 NOTE — Progress Notes (Signed)
 PROGRESS NOTE MAXEY RANSOM  NWG:[id number] DOB: [birth year] DOA: 10/20/2023 PCP: Collins Dean, NP  Brief Narrative/Hospital Course: 43 y.o. male with history of ESRD on hemodialysis MWF, hypertension, prior stroke, history of seizures, lupus anticoagulant brought to the ER on 5/5 after patient had a seizure, encephalopathy. Neurology was consulted. Patient remains encephalopathic.  Had breakthrough seizure 5/9 while on HD, and moved to PCU and found to have new strokes in MRI  The patient has been seen by neurology and nephrology. EEG is suggestive of moderate diffuse encephalopathy.  Sharp transients were noted in the right frontal region. Per neurology ADDED ON ltm eeg for further evaluation. MRI on 5/6 showed no evidence of acute intracranial abnormality, and multiple remote infarcts.  PTOT following  Subjective Patient seen and examined this morning Alert awake oriented to self current president said 2015 For year but but corrected 2025 Overnight afebrile BP stable  No neurological changes  At this time waiting for disposition   Assessment and Plan:  Acute right MCA patchy infarct as well as left thalamic and caudate head punctuate infarct Likely embolic, secondary to antiphospholipid syndrome with underdosed Eliquis . Neurology following, hide MRI brain 5/6 no acute infarct, MRI brain 5/11 multiple acute infarcts- SEE REPORT. MRA showed patency of major intracranial arteries. A1c 5.6.LDL: 75.  HDL: 26.  Carotid duplex no significant finding TTE:EF 70% mitral valve calcified, restricted possible rheumatic disease, moderate MS mild MR Noncompliant anticoagulation at home on 2.5 ELIQUIS  BID Now changed to 5 mg bid Now on Lipitor 40. Cont aggressive stroke risk modification , PTOT W/ Plan for SNF  Acute encephalopathy Seizure disorder w/ breakthrough seizure on 10/25/23: Initially thought due to postictal state, but persisted. S.p MRI- most llikely that the encephalopathy is 2/2  strokes involving the frontal region. Work up [employer]: WNL.HIV: Nonreactive.B12: 539 B1 nl, RPR nonreactive. B1: Within EEG notable for BL sharps as well as right frontal subclinical seizure.Off high-dose.  Mentation overall stable alert awake following commands appropriately not agitated.  Neurology signed off, continue on increased dose of Keppra  500 twice daily and 500 MWF, Depakote  1000 mg q12  ESRD on HD MWF: Secondary hyperparathyroidism/MBD Anemia of CKD Nephrology following continue HD- last HD 5/19 Continue to monitor calcium  close and hemoglobin intermittently. Continue Renvela , calcitriol  and Sensipar    Hypertension Stable. Off amlodipine  and Coreg    Functional impairment Debility/deconditioning: Continue PT/OT -recommending skilled nursing facility  Class I Obesity w/ Body mass index is 31.42 kg/m.: Will benefit with PCP follow-up, weight loss,healthy lifestyle and outpatient sleep eval if not done.  DVT prophylaxis:  Code Status:   Code Status: Full Code Family Communication: plan of care discussed with patient at bedside. Patient status is: Remains hospitalized because of severity of illness Level of care: Progressive   Dispo: The patient is from: home. Unmarried. Next of kin is [name] per him.            Anticipated disposition: snf pending Objective: Vitals last 24 hrs: Vitals:   11/05/23 2048 11/06/23 0032 11/06/23 0444 11/06/23 0748  BP: (!) 135/91 (!) 139/103 91/61 115/75  Pulse: 81 72 96 72  Resp: 12   18  Temp: 98.1 F (36.7 C) 98.8 F (37.1 C) (!) 97.5 F (36.4 C) (!) 97.5 F (36.4 C)  TempSrc: Oral Oral Oral Oral  SpO2: 99% 100% 98% 99%  Weight:      Height:        Physical Examination: General exam: alert awake, oriented fairly, at baseline,  older than stated age HEENT:Oral mucosa moist, Ear/Nose WNL grossly Respiratory system: Bilaterally clear BS,no use of accessory muscle Cardiovascular system: S1 & S2 +, No  JVD. Gastrointestinal system: Abdomen soft,NT,ND, BS+ Nervous System: Alert, awake, moving all extremities,and following commands. Extremities: LE edema neg,distal peripheral pulses palpable and warm.  Skin: No rashes,no icterus. MSK: Normal muscle bulk,tone, power    Data Reviewed: I have personally reviewed following labs and imaging studies ( see epic result tab) CBC: Recent Labs  Lab 10/31/23 1005 11/01/23 0739 11/02/23 1113 11/06/23 0536  WBC 6.7 8.2 7.1 6.9  HGB 14.0 13.9 12.4* 13.2  HCT 41.4 40.3 36.0* 39.0  MCV 89.6 89.2 89.8 90.3  PLT 72* 68* 60* 72*   CMP: Recent Labs  Lab 10/31/23 1005 11/01/23 0739 11/02/23 1114 11/04/23 0814 11/06/23 0536  NA 134* 132* 134* 132* 131*  K 3.9 4.7 4.7 4.6 4.3  CL 92* 93* 93* 90* 91*  CO2 26 22 23 23 22   GLUCOSE 83 72 83 77 112*  BUN 49* 66* 80* 76* 64*  CREATININE 13.87* 17.56* 20.79* 18.12* 15.96*  CALCIUM  9.1 9.7 9.5 10.3 10.2  PHOS  --   --  9.5* 9.5* 7.8*   GFR: Estimated Creatinine Clearance: 7.4 mL/min (A) (by C-G formula based on SCr of 15.96 mg/dL (H)). Recent Labs  Lab 11/02/23 1114 11/04/23 0814 11/06/23 0536  ALBUMIN  3.5 3.5 3.9    No results for input(s): "LIPASE", "AMYLASE" in the last 168 hours.  Recent Labs  Lab 10/31/23 1005  AMMONIA <13   Coagulation Profile: No results for input(s): "INR", "PROTIME" in the last 168 hours. Unresulted Labs (From admission, onward)     Start     Ordered   11/06/23 0500  Renal function panel  Daily,   R     Question:  Specimen collection method  Answer:  Lab=Lab collect   11/05/23 0834   Signed and Held  Renal function panel  Once,   R       Question:  Specimen collection method  Answer:  Lab=Lab collect   Signed and Held   Signed and Held  CBC  Once,   R       Question:  Specimen collection method  Answer:  Lab=Lab collect   Signed and Held           Antimicrobials/Microbiology: Anti-infectives (From admission, onward)    None         Component  Value Date/Time   SDES BLOOD RIGHT HAND 01/19/2023 0213   SPECREQUEST  01/19/2023 1610    BOTTLES DRAWN AEROBIC AND ANAEROBIC Blood Culture results may not be optimal due to an excessive volume of blood received in culture bottles   CULT  01/19/2023 0213    NO GROWTH 5 DAYS Performed at [name], [address] [place], [place] [address]    REPTSTATUS 01/24/2023 FINAL 01/19/2023 4098     Medications reviewed:  Scheduled Meds:  amLODipine   5 mg Oral Daily   apixaban   5 mg Oral BID   atorvastatin   40 mg Oral Daily   calcitRIOL   2.5 mcg Oral Q M,W,F-HD   Chlorhexidine  Gluconate Cloth  6 each Topical Q0600   cinacalcet   150 mg Oral Q M,W,F-HD   divalproex   1,000 mg Oral Q12H   feeding supplement (NEPRO CARB STEADY)  237 mL Oral BID BM   levETIRAcetam   500 mg Oral BID   levETIRAcetam   500 mg Oral Q M,W,F-HD   midodrine   10 mg Oral Once in dialysis   multivitamin  1 tablet Oral QHS   sevelamer  carbonate  3,200 mg Oral TID WC   sodium chloride  flush  10-40 mL Intracatheter Q12H   Continuous Infusions:  Lesa Rape, MD [facility] 11/06/2023, 11:56 AM

## 2023-11-06 NOTE — Progress Notes (Addendum)
 [facility] Progress Note   Subjective:   Patient seen in his room today. He denies any dyspnea or CP. Plan for HD on 11/06/23. No complaints or concerns today. Patient on room air and in NAD.   Objective Vitals:   11/05/23 2048 11/06/23 0032 11/06/23 0444 11/06/23 0748  BP: (!) 135/91 (!) 139/103 91/61 115/75  Pulse: 81 72 96 72  Resp: 12   18  Temp: 98.1 F (36.7 C) 98.8 F (37.1 C) (!) 97.5 F (36.4 C) (!) 97.5 F (36.4 C)  TempSrc: Oral Oral Oral Oral  SpO2: 99% 100% 98% 99%  Weight:      Height:       Physical Exam General: alert male in NAD, sleepy Heart: RRR, no murmurs, rubs or gallops Lungs:CTA bilaterally, respirations unlabored on RA Abdomen: Soft, non-distended, +BS Extremities: sockline edema b/l lower extremities Dialysis Access:  R internal jugular [facility]  Additional Objective Labs: Basic Metabolic Panel: Recent Labs  Lab 11/02/23 1114 11/04/23 0814 11/06/23 0536  NA 134* 132* 131*  K 4.7 4.6 4.3  CL 93* 90* 91*  CO2 23 23 22   GLUCOSE 83 77 112*  BUN 80* 76* 64*  CREATININE 20.79* 18.12* 15.96*  CALCIUM  9.5 10.3 10.2  PHOS 9.5* 9.5* 7.8*   Liver Function Tests: Recent Labs  Lab 11/02/23 1114 11/04/23 0814 11/06/23 0536  ALBUMIN  3.5 3.5 3.9   No results for input(s): "LIPASE", "AMYLASE" in the last 168 hours. CBC: Recent Labs  Lab 10/31/23 1005 11/01/23 0739 11/02/23 1113 11/06/23 0536  WBC 6.7 8.2 7.1 6.9  HGB 14.0 13.9 12.4* 13.2  HCT 41.4 40.3 36.0* 39.0  MCV 89.6 89.2 89.8 90.3  PLT 72* 68* 60* 72*   Blood Culture    Component Value Date/Time   SDES BLOOD RIGHT HAND 01/19/2023 0213   SPECREQUEST  01/19/2023 1610    BOTTLES DRAWN AEROBIC AND ANAEROBIC Blood Culture results may not be optimal due to an excessive volume of blood received in culture bottles   CULT  01/19/2023 0213    NO GROWTH 5 DAYS Performed at [facility], [address] [place], [place] [address]    REPTSTATUS 01/24/2023 FINAL  01/19/2023 4098    Medications:    amLODipine   5 mg Oral Daily   apixaban   5 mg Oral BID   atorvastatin   40 mg Oral Daily   calcitRIOL   2.5 mcg Oral Q M,W,F-HD   Chlorhexidine  Gluconate Cloth  6 each Topical Q0600   cinacalcet   150 mg Oral Q M,W,F-HD   divalproex   1,000 mg Oral Q12H   feeding supplement (NEPRO CARB STEADY)  237 mL Oral BID BM   levETIRAcetam   500 mg Oral BID   levETIRAcetam   500 mg Oral Q M,W,F-HD   midodrine   10 mg Oral Once in dialysis   multivitamin  1 tablet Oral QHS   sevelamer  carbonate  3,200 mg Oral TID WC   sodium chloride  flush  10-40 mL Intracatheter Q12H    Dialysis Orders: MWF [facility] 4h   B400   110.5kg  RIJ TDC   Heparin  none Mircera: none Calcitriol  2.5mcg PO qHD - last dose 11/01/23 Sensipar  150mg  with HD - last dose 11/01/23  Assessment/Plan: Seizure disorder - Neurology following; EEG suggestive of moderate diffuse encephalopathy, no seizures noted and MRI brain no acute changes. Had seizures on HD 5/09. Is on keppra , depakote  and vimpat  now. Per neuro/ pmd.  Acute encephalopathy - doing better, still some deficits per neurology.  CT clear, and EEG showing moderate diffuse encephalopathy. MRI 10/27/23 showing Multiple acute infarcts in the right frontal, parietal and posterior temporal cortex as well as the adjacent subcortical white matter and punctate acute infarcts in the left basal ganglia, thalamus and overlying white matter  ESRD -  HD per MWF schedule. HD 11/06/23. We are reducing his amlodipine .  Would defer midodrine  for now  HTN/ volume  - Patients BP is acceptable today.  Appears euvolemic on exam, under his prior EDW.  Anemia of CKD - Hgb 13.2 today. No Fe/ESA is indicated at this time. Secondary hyperparathyroidism - Calcium  controlled. Phos above goal but coming down with binder. Continue calcitriol , sensipar  and renvela  Nutrition - Renal diet with fluid restriction, does not seem to be eating much. Continue protein supplements. Albumin   3.9  Hersey Lorenzo, NP-C 11/06/2023, 11:09 AM  [facility]   Seen and examined independently.  Agree with note and exam as documented above by physician extender and as noted here.  Reduced amlodipine .  Defer midodrine  with concurrent anti-hypertensives   Seen and examined on dialysis. Procedure supervised.  Blood pressure 129/88 and HR 85.  Tolerating goal.  RIJ tunn catheter in use.  Updated note above.   Nan Aver, MD 11/06/2023  3:26 PM

## 2023-11-06 NOTE — Plan of Care (Signed)
  Problem: Education: Goal: Knowledge of General Education information will improve Description: Including pain rating scale, medication(s)/side effects and non-pharmacologic comfort measures Outcome: Progressing   Problem: Health Behavior/Discharge Planning: Goal: Ability to manage health-related needs will improve Outcome: Progressing   Problem: Clinical Measurements: Goal: Ability to maintain clinical measurements within normal limits will improve Outcome: Progressing Goal: Will remain free from infection Outcome: Progressing Goal: Diagnostic test results will improve Outcome: Progressing Goal: Respiratory complications will improve Outcome: Progressing Goal: Cardiovascular complication will be avoided Outcome: Progressing   Problem: Elimination: Goal: Will not experience complications related to bowel motility Outcome: Progressing Goal: Will not experience complications related to urinary retention Outcome: Progressing   Problem: Coping: Goal: Level of anxiety will decrease Outcome: Progressing   Problem: Nutrition: Goal: Adequate nutrition will be maintained Outcome: Progressing   Problem: Pain Managment: Goal: General experience of comfort will improve and/or be controlled Outcome: Progressing

## 2023-11-06 NOTE — Progress Notes (Signed)
   11/06/23 1727  Vitals  Temp 98.2 F (36.8 C)  Pulse Rate 75  Resp 14  BP (!) 122/94  SpO2 96 %  O2 Device Room Air  Weight 106 kg  Type of Weight Post-Dialysis  Post Treatment  Dialyzer Clearance Clear  Hemodialysis Intake (mL) 0 mL  Liters Processed 83.8  Fluid Removed (mL) 2000 mL  Tolerated HD Treatment Yes   Received patient in bed to unit.  Alert and oriented.  Informed consent signed and in chart.   TX duration:3.5hrs  Patient tolerated well.  Transported back to the room  Alert, without acute distress.  Hand-off given to patient's nurse.   Access used: [facility] Access issues: none  Total UF removed: 2L Medication(s) given: midodrine     [facility]

## 2023-11-06 NOTE — Progress Notes (Signed)
 SLP Cancellation Note  Patient Details Name: [name] [name] MRN: [id number] DOB: 07/27/1980   Cancelled treatment:       Reason Eval/Treat Not Completed: Patient at procedure or test/unavailable (HD). SLP will f/u.    [name], M.A., CCC-SLP Speech Language Pathology, Acute Rehabilitation Services  Secure Chat preferred [phone]  11/06/2023, 1:15 PM

## 2023-11-06 NOTE — Progress Notes (Signed)
 Occupational Therapy Treatment Patient Details Name: [name] [name] MRN: [id number] DOB: August 07, 1980 Today's Date: 11/06/2023   History of present illness Patient is a 43 year old male with witnessed seizure at dialysis on 10/21/2023. Patient leaving AMA, and returning a few hours later via [facility]. CT clear, and EEG showing moderate diffuse encephalopathy. MRI 10/27/23 showing Multiple acute infarcts in the right frontal, parietal and posterior temporal cortex as well as the adjacent subcortical white matter and punctate acute infarcts in the left basal ganglia, thalamus and overlying white matter. PMH: ESRD on HD, antiphospholipid syndrome, HTN, seizure, stroke.   OT comments  Patient demonstrating good gains with OT treatment. Patient able to get to EOB with supervision and ambulated to sink with CGA and cues for safety and to stay in RW. Patient began grooming standing with assistance to open containers and completed in sitting. Patient performed bathing seated in chair with cues for sequencing and min assist for UB and mod assist for LB with patient standing for peri area bathing. Patient returned to bed for breakfast with setup. Patient will benefit from continued inpatient follow up therapy, <3 hours/day. Acute OT to continue to follow to address established goals to facilitate DC to next venue of care.        If plan is discharge home, recommend the following:  Assistance with cooking/housework;Direct supervision/assist for medications management;Direct supervision/assist for financial management;Assist for transportation;Help with stairs or ramp for entrance;A little help with walking and/or transfers;A lot of help with bathing/dressing/bathroom   Equipment Recommendations  Other (comment) (defer)    Recommendations for Other Services      Precautions / Restrictions Precautions Precautions: Fall Recall of Precautions/Restrictions: Intact Restrictions Weight Bearing Restrictions Per Provider  Order: No       Mobility Bed Mobility Overal bed mobility: Needs Assistance Bed Mobility: Supine to Sit, Sit to Supine     Supine to sit: Supervision Sit to supine: Supervision   General bed mobility comments: verbal cues to initiate    Transfers Overall transfer level: Needs assistance Equipment used: Rolling walker (2 wheels) Transfers: Sit to/from Stand Sit to Stand: Min assist, Contact guard assist           General transfer comment: stood from raised bed with CGA and min to CGA to stand from chair at sink     Balance Overall balance assessment: Needs assistance Sitting-balance support: Feet supported Sitting balance-Leahy Scale: Fair Sitting balance - Comments: EOB   Standing balance support: Single extremity supported, Bilateral upper extremity supported, During functional activity Standing balance-Leahy Scale: Fair Standing balance comment: able to stand at sink with at least one extremity support during self care tasks                           ADL either performed or assessed with clinical judgement   ADL Overall ADL's : Needs assistance/impaired Eating/Feeding: Set up Eating/Feeding Details (indicate cue type and reason): assistance with setup Grooming: Wash/dry hands;Wash/dry face;Oral care;Applying deodorant;Minimal assistance;Sitting;Standing Grooming Details (indicate cue type and reason): began grooming standing and completed in sitting, required assistance with opening containers Upper Body Bathing: Minimal assistance;Cueing for sequencing;Sitting Upper Body Bathing Details (indicate cue type and reason): assistance for back and cues for sequencing, patient resting BUE on sink to wash under arms Lower Body Bathing: Moderate assistance;Sit to/from stand Lower Body Bathing Details (indicate cue type and reason): peri area front and legs performed in sitting and peri area back in  standing with mod assist to complete Upper Body Dressing :  Minimal assistance;Sitting Upper Body Dressing Details (indicate cue type and reason): to change gown                 Functional mobility during ADLs: Minimal assistance;Rolling walker (2 wheels) General ADL Comments: demonstrating gains with self care but required cognitive cueing    Extremity/Trunk Assessment              Vision       Perception     Praxis     Communication Communication Communication: Impaired Factors Affecting Communication: Reduced clarity of speech   Cognition Arousal: Alert Behavior During Therapy: Flat affect Cognition: Cognition impaired   Orientation impairments: Time, Situation Awareness: Intellectual awareness impaired, Online awareness impaired Memory impairment (select all impairments): Short-term memory, Working [employer] fast streamer, Conservation officer, historic buildings, Non-declarative long-term memory Attention impairment (select first level of impairment): Focused attention Executive functioning impairment (select all impairments): Initiation, Organization, Sequencing, Reasoning, Problem solving OT - Cognition Comments: required frequent cues for sequencing during self care                 Following commands: Impaired Following commands impaired: Follows one step commands inconsistently, Follows one step commands with increased time      Cueing   Cueing Techniques: Verbal cues, Tactile cues, Visual cues, Gestural cues  Exercises      Shoulder Instructions       General Comments VSS on RA    Pertinent Vitals/ Pain       Pain Assessment Pain Assessment: No/denies pain Pain Intervention(s): Monitored during session  Home Living                                          Prior Functioning/Environment              Frequency  Min 2X/week        Progress Toward Goals  OT Goals(current goals can now be found in the care plan section)  Progress towards OT goals: Progressing toward goals  Acute Rehab OT  Goals Patient Stated Goal: to discharge from hospital OT Goal Formulation: With patient Time For Goal Achievement: 11/12/23 Potential to Achieve Goals: Fair ADL Goals Pt Will Perform Eating: with set-up;with supervision;sitting Pt Will Perform Grooming: with set-up;with supervision;sitting Pt Will Perform Upper Body Bathing: with min assist;sitting Additional ADL Goal #1: Pt will demosntrate selective attention in nondistracting environment to increase independence wiht ALD tasks.  Plan      Co-evaluation                 AM-PAC OT "6 Clicks" Daily Activity     Outcome Measure   Help from another person eating meals?: A Little Help from another person taking care of personal grooming?: A Little Help from another person toileting, which includes using toliet, bedpan, or urinal?: A Lot Help from another person bathing (including washing, rinsing, drying)?: A Lot Help from another person to put on and taking off regular upper body clothing?: A Lot Help from another person to put on and taking off regular lower body clothing?: A Lot 6 Click Score: 14    End of Session Equipment Utilized During Treatment: Gait belt;Rolling walker (2 wheels)  OT Visit Diagnosis: Unsteadiness on feet (R26.81);Other abnormalities of gait and mobility (R26.89);Muscle weakness (generalized) (M62.81);Other symptoms and signs involving the nervous system (R29.898);Other  symptoms and signs involving cognitive function   Activity Tolerance Patient tolerated treatment well   Patient Left in bed;with call bell/phone within reach;with bed alarm set   Nurse Communication Mobility status        Time: 1610-9604 OT Time Calculation (min): 28 min  Charges: OT General Charges $OT Visit: 1 Visit OT Treatments $Self Care/Home Management : 23-37 mins  Anitra Barn, OTA Acute Rehabilitation Services  Office [phone]   Jovita Nipper 11/06/2023, 8:57 AM

## 2023-11-07 DIAGNOSIS — G9340 Encephalopathy, unspecified: Secondary | ICD-10-CM | POA: Diagnosis not present

## 2023-11-07 LAB — RENAL FUNCTION PANEL
Albumin: 3.8 g/dL (ref 3.5–5.0)
Anion gap: 13 (ref 5–15)
BUN: 38 mg/dL — ABNORMAL HIGH (ref 6–20)
CO2: 25 mmol/L (ref 22–32)
Calcium: 10.3 mg/dL (ref 8.9–10.3)
Chloride: 95 mmol/L — ABNORMAL LOW (ref 98–111)
Creatinine, Ser: 12.09 mg/dL — ABNORMAL HIGH (ref 0.61–1.24)
GFR, Estimated: 5 mL/min — ABNORMAL LOW (ref >60)
Glucose, Bld: 102 mg/dL — ABNORMAL HIGH (ref 70–99)
Phosphorus: 5.6 mg/dL — ABNORMAL HIGH (ref 2.5–4.6)
Potassium: 4 mmol/L (ref 3.5–5.1)
Sodium: 133 mmol/L — ABNORMAL LOW (ref 135–145)

## 2023-11-07 MED ORDER — OLANZAPINE 10 MG IM SOLR
5.0000 mg | Freq: Once | INTRAMUSCULAR | Status: AC
  Administered 2023-11-08: 5 mg via INTRAMUSCULAR
  Filled 2023-11-07: qty 10

## 2023-11-07 MED ORDER — CHLORHEXIDINE GLUCONATE CLOTH 2 % EX PADS
6.0000 | MEDICATED_PAD | Freq: Every day | CUTANEOUS | Status: DC
  Administered 2023-11-08: 6 via TOPICAL

## 2023-11-07 MED ORDER — HALOPERIDOL LACTATE 5 MG/ML IJ SOLN: 1.0000 mg | Freq: Four times a day (QID) | INTRAMUSCULAR | Status: DC | PRN

## 2023-11-07 MED ORDER — HALOPERIDOL LACTATE 5 MG/ML IJ SOLN
1.0000 mg | Freq: Four times a day (QID) | INTRAMUSCULAR | Status: DC | PRN
  Administered 2023-11-07: 1 mg via INTRAMUSCULAR
  Filled 2023-11-07: qty 1

## 2023-11-07 NOTE — Progress Notes (Signed)
 Physical Therapy Treatment Patient Details Name: [name] [name] MRN: [id number] DOB: [birth year] Today's Date: 11/07/2023   History of Present Illness Patient is a 43 year old male with witnessed seizure at dialysis on 10/21/2023. Patient leaving AMA, and returning a few hours later via [facility]. CT clear, and EEG showing moderate diffuse encephalopathy. MRI 10/27/23 showing Multiple acute infarcts in the right frontal, parietal and posterior temporal cortex as well as the adjacent subcortical white matter and punctate acute infarcts in the left basal ganglia, thalamus and overlying white matter. PMH: ESRD on HD, antiphospholipid syndrome, HTN, seizure, stroke.    PT Comments  Pt tolerated treatment well today. Pt was able to progress ambulation today in hallway and navigate stairs with CGA/Min A no AD. No change in DC/DME recs at this time. PT will continue to follow.     If plan is discharge home, recommend the following: Assistance with cooking/housework;Supervision due to cognitive status;Help with stairs or ramp for entrance;A little help with walking and/or transfers   Can travel by private vehicle     No  Equipment Recommendations  Rolling walker (2 wheels);BSC/3in1    Recommendations for Other Services       Precautions / Restrictions Precautions Precautions: Fall Recall of Precautions/Restrictions: Intact Restrictions Weight Bearing Restrictions Per Provider Order: No     Mobility  Bed Mobility               General bed mobility comments: Seated EOB    Transfers Overall transfer level: Needs assistance Equipment used: None Transfers: Sit to/from Stand Sit to Stand: Contact guard assist           General transfer comment: Refused RW. CGA for safety and to steady.    Ambulation/Gait Ambulation/Gait assistance: Contact guard assist, Min assist Gait Distance (Feet): 120 Feet Assistive device: None Gait Pattern/deviations: Step-through pattern, Decreased stride  length, Shuffle Gait velocity: decreased     General Gait Details: Pt initially started with a shuffle like gait pattern however was able to improve step length with further distance. no LOB noted.   Stairs Stairs: Yes Stairs assistance: Contact guard assist Stair Management: Two rails, Step to pattern, Forwards, Sideways Number of Stairs: 2 General stair comments: Forwards going up and sideways coming down.   Wheelchair Mobility     Tilt Bed    Modified Rankin (Stroke Patients Only) Modified Rankin (Stroke Patients Only) Pre-Morbid Rankin Score: Slight disability Modified Rankin: Moderately severe disability     Balance Overall balance assessment: Needs assistance Sitting-balance support: Feet supported Sitting balance-Leahy Scale: Fair Sitting balance - Comments: EOB   Standing balance support: Single extremity supported, Bilateral upper extremity supported, During functional activity Standing balance-Leahy Scale: Fair                              Hotel manager: Impaired Factors Affecting Communication: Reduced clarity of speech (Soft spoken)  Cognition Arousal: Alert Behavior During Therapy: Flat affect   PT - Cognitive impairments: Problem solving, Sequencing                       PT - Cognition Comments: Minimally verbal with increased time for motor planning. Constant cues for sequencing. Following commands: Impaired Following commands impaired: Follows one step commands inconsistently, Follows one step commands with increased time    Cueing Cueing Techniques: Verbal cues, Tactile cues, Visual cues, Gestural cues  Exercises  General Comments General comments (skin integrity, edema, etc.): VSS      Pertinent Vitals/Pain Pain Assessment Pain Assessment: No/denies pain    Home Living                          Prior Function            PT Goals (current goals can now be found in the  care plan section) Progress towards PT goals: Progressing toward goals    Frequency    Min 2X/week      PT Plan      Co-evaluation              AM-PAC PT "6 Clicks" Mobility   Outcome Measure  Help needed turning from your back to your side while in a flat bed without using bedrails?: A Little Help needed moving from lying on your back to sitting on the side of a flat bed without using bedrails?: A Little Help needed moving to and from a bed to a chair (including a wheelchair)?: A Little Help needed standing up from a chair using your arms (e.g., wheelchair or bedside chair)?: A Little Help needed to walk in hospital room?: A Little Help needed climbing 3-5 steps with a railing? : A Lot 6 Click Score: 17    End of Session Equipment Utilized During Treatment: Gait belt Activity Tolerance: Patient tolerated treatment well Patient left: in bed;with call bell/phone within reach (Seated EOB) Nurse Communication: Mobility status PT Visit Diagnosis: Muscle weakness (generalized) (M62.81);Unsteadiness on feet (R26.81)     Time: 0102-7253 PT Time Calculation (min) (ACUTE ONLY): 8 min  Charges:    $Gait Training: 8-22 mins PT General Charges $$ ACUTE PT VISIT: 1 Visit                     Rodgers Clack, PT, DPT Acute Rehab Services [phone]    Brisha Mccabe 11/07/2023, 4:41 PM

## 2023-11-07 NOTE — Progress Notes (Signed)
 PROGRESS NOTE [name] [name]  ZOX:[id number] DOB: [birth year] DOA: 10/20/2023 PCP: Collins Dean, NP  Brief Narrative/Hospital Course: 43 y.o. male with history of ESRD on hemodialysis MWF, hypertension, prior stroke, history of seizures, lupus anticoagulant brought to the ER on 5/5 after patient had a seizure, encephalopathy. Neurology was consulted. Patient remains encephalopathic.  Had breakthrough seizure 5/9 while on HD, and moved to PCU and found to have new strokes in MRI  The patient has been seen by neurology and nephrology. EEG is suggestive of moderate diffuse encephalopathy.  Sharp transients were noted in the right frontal region. Per neurology ADDED ON ltm eeg for further evaluation. MRI on 5/6 showed no evidence of acute intracranial abnormality, and multiple remote infarcts.  PTOT following  Subjective Patient seen and examined  Put on the table has not started eating  Alert awake neurologically not much changed   Assessment and Plan:  Acute right MCA patchy infarct/ left thalamic and caudate head punctuate infarct: Likely embolic, secondary to antiphospholipid syndrome with underdosed Eliquis . Neurology following, MRI brain 5/6 no acute infarct, MRI brain 5/11 multiple acute infarcts S/p completion of stroke workup: MRA patency of major intracranial arteries. A1c 5.6.LDL: 75.  HDL: 26.  Carotid duplex no significant finding TTE:EF 70% mitral valve calcified, restricted possible rheumatic disease, mod MS mild MR Noncompliant anticoagulation at home on 2.5 ELIQUIS  BID Continue Eliquis  at 5 mg twice daily, started on Lipitor 40 Cont aggressive stroke risk modification , PTOT W/ Plan for SNF  Acute encephalopathy Seizure disorder w/ breakthrough seizure on 10/25/23: Initially thought due to postictal state, but persisted. S.p MRI- most llikely that the encephalopathy is 2/2 strokes involving the frontal region. Work up [employer]: WNL.HIV: Nonreactive.B12: 539 B1  nl, RPR nonreactive. B1: Within EEG notable for BL sharps as well as right frontal subclinical seizure.Off high-dose.  Mentation overall stable alert awake following commands appropriately not agitated.  Neurology signed off, continue on increased dose of Keppra  500 twice daily and 500 MWF, Depakote  1000 mg q12  ESRD on HD MWF: Secondary hyperparathyroidism/MBD Anemia of CKD Nephrology following continue HD-had HD 5/20 without any issues or hypotension  Continue to monitor calcium  close and hemoglobin intermittently. Continue Renvela , calcitriol  and Sensipar    Hypertension Well-controlled . Off amlodipine  and Coreg  due to hypotension   Functional impairment Debility/deconditioning: Continue PT/OT -recommending skilled nursing facility> medically stable and awaiting for placement  Class I Obesity w/ Body mass index is 31.69 kg/m.: Will benefit with PCP follow-up, weight loss,healthy lifestyle and outpatient sleep eval if not done.  DVT prophylaxis:  Code Status:   Code Status: Full Code Family Communication: plan of care discussed with patient at bedside. Patient status is: Remains hospitalized because of severity of illness Level of care: Progressive   Dispo: The patient is from: home. Unmarried. Next of kin is [name] per him.            Anticipated disposition: snf pending Objective: Vitals last 24 hrs: Vitals:   11/06/23 1940 11/07/23 0020 11/07/23 0337 11/07/23 0818  BP: (!) 133/90 124/81 94/67 96/71   Pulse: 73 86 85 75  Resp: 16 16 18 14   Temp: 98 F (36.7 C) 98 F (36.7 C) 98.3 F (36.8 C) 98.1 F (36.7 C)  TempSrc: Oral Oral Oral Oral  SpO2: 93% 91% 95% 100%  Weight:      Height:        Physical Examination:  General exam: alert awake, oriented at baseline, slow to respond-baseline HEENT:Oral mucosa  moist, Ear/Nose WNL grossly Respiratory system: Bilaterally clear BS,no use of accessory muscle Cardiovascular system: S1 & S2 +, No  JVD. Gastrointestinal system: Abdomen soft,NT,ND, BS+ Nervous System: Alert, awake, moving all extremities,and following commands. Extremities: LE edema neg,distal peripheral pulses palpable and warm.  Skin: No rashes,no icterus. MSK: Normal muscle bulk,tone, power  Right  internal jugular DC w/ dressing intact  Data Reviewed: I have personally reviewed following labs and imaging studies ( see epic result tab) CBC: Recent Labs  Lab 11/01/23 0739 11/02/23 1113 11/06/23 0536  WBC 8.2 7.1 6.9  HGB 13.9 12.4* 13.2  HCT 40.3 36.0* 39.0  MCV 89.2 89.8 90.3  PLT 68* 60* 72*   CMP: Recent Labs  Lab 11/01/23 0739 11/02/23 1114 11/04/23 0814 11/06/23 0536 11/07/23 0441  NA 132* 134* 132* 131* 133*  K 4.7 4.7 4.6 4.3 4.0  CL 93* 93* 90* 91* 95*  CO2 22 23 23 22 25   GLUCOSE 72 83 77 112* 102*  BUN 66* 80* 76* 64* 38*  CREATININE 17.56* 20.79* 18.12* 15.96* 12.09*  CALCIUM  9.7 9.5 10.3 10.2 10.3  PHOS  --  9.5* 9.5* 7.8* 5.6*   GFR: Estimated Creatinine Clearance: 9.8 mL/min (A) (by C-G formula based on SCr of 12.09 mg/dL (H)). Recent Labs  Lab 11/02/23 1114 11/04/23 0814 11/06/23 0536 11/07/23 0441  ALBUMIN  3.5 3.5 3.9 3.8    No results for input(s): "LIPASE", "AMYLASE" in the last 168 hours.  No results for input(s): "AMMONIA" in the last 168 hours.  Coagulation Profile: No results for input(s): "INR", "PROTIME" in the last 168 hours. Unresulted Labs (From admission, onward)     Start     Ordered   11/06/23 0500  Renal function panel  Daily,   R     Question:  Specimen collection method  Answer:  Lab=Lab collect   11/05/23 0834   Signed and Held  Renal function panel  Once,   R       Question:  Specimen collection method  Answer:  Lab=Lab collect   Signed and Held   Signed and Held  CBC  Once,   R       Question:  Specimen collection method  Answer:  Lab=Lab collect   Signed and Held           Antimicrobials/Microbiology: Anti-infectives (From admission,  onward)    None         Component Value Date/Time   SDES BLOOD RIGHT HAND 01/19/2023 0213   SPECREQUEST  01/19/2023 0981    BOTTLES DRAWN AEROBIC AND ANAEROBIC Blood Culture results may not be optimal due to an excessive volume of blood received in culture bottles   CULT  01/19/2023 0213    NO GROWTH 5 DAYS Performed at [facility], [address], [place], [place] [address]    REPTSTATUS 01/24/2023 FINAL 01/19/2023 8295     Medications reviewed:  Scheduled Meds:  apixaban   5 mg Oral BID   atorvastatin   40 mg Oral Daily   calcitRIOL   2.5 mcg Oral Q M,W,F-HD   Chlorhexidine  Gluconate Cloth  6 each Topical Q0600   cinacalcet   150 mg Oral Q M,W,F-HD   divalproex   1,000 mg Oral Q12H   feeding supplement (NEPRO CARB STEADY)  237 mL Oral BID BM   levETIRAcetam   500 mg Oral BID   levETIRAcetam   500 mg Oral Q M,W,F-HD   multivitamin  1 tablet Oral QHS   sevelamer  carbonate  3,200 mg Oral  TID WC   sodium chloride  flush  10-40 mL Intracatheter Q12H   Continuous Infusions:  Lesa Rape, MD [facility] 11/07/2023, 10:07 AM

## 2023-11-07 NOTE — Progress Notes (Addendum)
 Speech Language Pathology Treatment: Cognitive-Linquistic  Patient Details Name: [name] [name] MRN: [id number] DOB: [birth year] Today's Date: 11/07/2023 Time: 6962-9528 SLP Time Calculation (min) (ACUTE ONLY): 22 min  Assessment / Plan / Recommendation Clinical Impression  Pt is eager to work with therapy to regain previous level of independence. He displays impaired reasoning with decreased awareness of CLOF and support needed. Pt and his friend confirm that pt was independently managing medication PTA and describe a significant change from baseline despite history of cognitive impairments. Pt describes using a chart to help organize meds and dosage, which worked well for him. Today, he was unable to identify errors in a pillbox activity given Max multimodal cueing. He then had increased difficulty identifying objects in a picture. Given Max cues, he identified 1/7 objects. Pt required cueing to scan to the L, primarily focusing on the R side of the picture. He demonstrates difficulty with functional problem solving, attention, and working memory. Pt wants to continue working on cognitive goals and may benefit from ongoing SLP f/u.     HPI HPI: [name] [name] is a 43 yo male presenting to ED 5/5 after having a seizure 5/2 and missing HD. He initially left AMA but returned. LTM EEG showed moderate diffuse encephalopathy and one seizure without clinical signs arising from the R frontal region. Seen by SLP 03/2023 targeting goals related to problem solving, selective attention, and auditory comprehension. PMH includes ESRD on HD, HTN, prior stroke, seizures, lupus      SLP Plan  Continue with current plan of care      Recommendations for follow up therapy are one component of a multi-disciplinary discharge planning process, led by the attending physician.  Recommendations may be updated based on patient status, additional functional criteria and insurance authorization.    Recommendations                      Oral care BID   Frequent or constant Supervision/Assistance Cognitive communication deficit (U13.244)     Continue with current plan of care     [name], M.A., CCC-SLP Speech Language Pathology, Acute Rehabilitation Services  Secure Chat preferred [phone]   11/07/2023, 5:10 PM

## 2023-11-07 NOTE — Plan of Care (Signed)

## 2023-11-07 NOTE — Progress Notes (Addendum)
 [facility] Progress Note   Subjective:   Patient seen in his room today. He denies any dyspnea or CP. Last HD on 11/06/23 with 2L UF. No complaints or concerns today. Patient on room air and in NAD. Patients CCB was stopped today due to hypotension. Continue to monitor BP while he is off antihypertensives. K is 4.0 and his Hgb is stable. Next HD 11/08/23  Objective Vitals:   11/06/23 1940 11/07/23 0020 11/07/23 0337 11/07/23 0818  BP: (!) 133/90 124/81 94/67 96/71   Pulse: 73 86 85 75  Resp: 16 16 18 14   Temp: 98 F (36.7 C) 98 F (36.7 C) 98.3 F (36.8 C) 98.1 F (36.7 C)  TempSrc: Oral Oral Oral Oral  SpO2: 93% 91% 95% 100%  Weight:      Height:       Physical Exam General: alert male in NAD, sleepy Heart: RRR, no murmurs, rubs or gallops Lungs:CTA bilaterally, respirations unlabored on RA Abdomen: Soft, non-distended, +BS Extremities: No LE edema bilaterally Dialysis Access:  R internal jugular [facility]  Additional Objective Labs: Basic Metabolic Panel: Recent Labs  Lab 11/04/23 0814 11/06/23 0536 11/07/23 0441  NA 132* 131* 133*  K 4.6 4.3 4.0  CL 90* 91* 95*  CO2 23 22 25   GLUCOSE 77 112* 102*  BUN 76* 64* 38*  CREATININE 18.12* 15.96* 12.09*  CALCIUM  10.3 10.2 10.3  PHOS 9.5* 7.8* 5.6*   Liver Function Tests: Recent Labs  Lab 11/04/23 0814 11/06/23 0536 11/07/23 0441  ALBUMIN  3.5 3.9 3.8   No results for input(s): "LIPASE", "AMYLASE" in the last 168 hours. CBC: Recent Labs  Lab 10/31/23 1005 11/01/23 0739 11/02/23 1113 11/06/23 0536  WBC 6.7 8.2 7.1 6.9  HGB 14.0 13.9 12.4* 13.2  HCT 41.4 40.3 36.0* 39.0  MCV 89.6 89.2 89.8 90.3  PLT 72* 68* 60* 72*   Blood Culture    Component Value Date/Time   SDES BLOOD RIGHT HAND 01/19/2023 0213   SPECREQUEST  01/19/2023 1610    BOTTLES DRAWN AEROBIC AND ANAEROBIC Blood Culture results may not be optimal due to an excessive volume of blood received in culture bottles   CULT  01/19/2023  0213    NO GROWTH 5 DAYS Performed at [facility], [address] [place], [place] [address]    REPTSTATUS 01/24/2023 FINAL 01/19/2023 4098    Medications:    apixaban   5 mg Oral BID   atorvastatin   40 mg Oral Daily   calcitRIOL   2.5 mcg Oral Q M,W,F-HD   Chlorhexidine  Gluconate Cloth  6 each Topical Q0600   cinacalcet   150 mg Oral Q M,W,F-HD   divalproex   1,000 mg Oral Q12H   feeding supplement (NEPRO CARB STEADY)  237 mL Oral BID BM   levETIRAcetam   500 mg Oral BID   levETIRAcetam   500 mg Oral Q M,W,F-HD   multivitamin  1 tablet Oral QHS   sevelamer  carbonate  3,200 mg Oral TID WC   sodium chloride  flush  10-40 mL Intracatheter Q12H    Dialysis Orders: MWF [facility] 4h   B400   110.5kg  RIJ TDC   Heparin  none Mircera: none Calcitriol  2.5mcg PO qHD - last dose 11/01/23 Sensipar  150mg  with HD - last dose 11/01/23  Assessment/Plan: Seizure disorder - Neurology following; EEG suggestive of moderate diffuse encephalopathy, no seizures noted and MRI brain no acute changes. Had seizures on HD 5/09. Is on keppra , depakote  and vimpat  now. Per neuro/ pmd.  Acute encephalopathy -  doing better, still some deficits per neurology. CT clear, and EEG showing moderate diffuse encephalopathy. MRI 10/27/23 showing Multiple acute infarcts in the right frontal, parietal and posterior temporal cortex as well as the adjacent subcortical white matter and punctate acute infarcts in the left basal ganglia, thalamus and overlying white matter  ESRD -  HD per MWF schedule. HD 11/06/23. His amlodipine  was stopped today due to low BP. Continue to monitor his BP.  HTN/ volume  - Patients BP has been low but he is asymptomatic.  Appears euvolemic on exam, under his prior EDW.  Anemia of CKD - Hgb 13.2 today. No Fe/ESA is indicated at this time. Secondary hyperparathyroidism - Calcium  controlled. Phos above goal but coming down with binder. Continue calcitriol , sensipar  and renvela  Nutrition - Renal diet with  fluid restriction, does not seem to be eating much. Continue protein supplements. Albumin  3.8.   [name] Lorenzo, NP-C 11/07/2023, 8:45 AM  [facility]   Seen and examined independently.  Agree with note and exam as documented above by physician extender and as noted here.  General adult male in bed in no acute distress HEENT normocephalic atraumatic extraocular movements intact sclera anicteric Neck supple trachea midline Lungs clear to auscultation bilaterally normal work of breathing at rest  Heart S1S2 no rub Abdomen soft nontender nondistended Extremities no edema  Psych normal mood and affect Neuro oriented to person and year; not to location; conversant but flat affect Access RIJ tunn catheter old AVF LUE no bruit   ESRD - on HD per MWF schedule   Encephalopathy - improved but still present   HTN - stopped amlodipine  due to hypotension    Anemia of CKD - no ESA indicated    Disposition per primary team - for SNF per charting   Nan Aver, MD 11/07/2023  2:25 PM

## 2023-11-08 DIAGNOSIS — G9340 Encephalopathy, unspecified: Secondary | ICD-10-CM | POA: Diagnosis not present

## 2023-11-08 LAB — RENAL FUNCTION PANEL
Albumin: 3.7 g/dL (ref 3.5–5.0)
Anion gap: 17 — ABNORMAL HIGH (ref 5–15)
BUN: 53 mg/dL — ABNORMAL HIGH (ref 6–20)
CO2: 22 mmol/L (ref 22–32)
Calcium: 10.2 mg/dL (ref 8.9–10.3)
Chloride: 97 mmol/L — ABNORMAL LOW (ref 98–111)
Creatinine, Ser: 15.74 mg/dL — ABNORMAL HIGH (ref 0.61–1.24)
GFR, Estimated: 3 mL/min — ABNORMAL LOW (ref >60)
Glucose, Bld: 64 mg/dL — ABNORMAL LOW (ref 70–99)
Phosphorus: 7.8 mg/dL — ABNORMAL HIGH (ref 2.5–4.6)
Potassium: 4.5 mmol/L (ref 3.5–5.1)
Sodium: 136 mmol/L (ref 135–145)

## 2023-11-08 MED ORDER — ANTICOAGULANT SODIUM CITRATE 4% (200MG/5ML) IV SOLN: 5.0000 mL | Status: DC | PRN

## 2023-11-08 MED ORDER — ONDANSETRON HCL 4 MG/2ML IJ SOLN: 4.0000 mg | Freq: Four times a day (QID) | INTRAMUSCULAR | Status: DC | PRN

## 2023-11-08 MED ORDER — LIDOCAINE HCL (PF) 1 % IJ SOLN: 5.0000 mL | INTRAMUSCULAR | Status: DC | PRN

## 2023-11-08 MED ORDER — STERILE WATER FOR INJECTION IJ SOLN
INTRAMUSCULAR | Status: AC
  Filled 2023-11-08: qty 10

## 2023-11-08 MED ORDER — LIDOCAINE-PRILOCAINE 2.5-2.5 % EX CREA: 1.0000 | TOPICAL_CREAM | CUTANEOUS | Status: DC | PRN

## 2023-11-08 MED ORDER — PENTAFLUOROPROP-TETRAFLUOROETH EX AERO: 1.0000 | INHALATION_SPRAY | CUTANEOUS | Status: DC | PRN

## 2023-11-08 MED ORDER — HEPARIN SODIUM (PORCINE) 1000 UNIT/ML DIALYSIS: 1000.0000 [IU] | INTRAMUSCULAR | Status: DC | PRN

## 2023-11-08 NOTE — TOC Progression Note (Signed)
 Transition of Care [facility]) - Progression Note    Patient Details  Name: [name] [name] MRN: [id number] Date of Birth: 06/13/1981  Transition of [facility]) CM/SW Contact  Heddy Vidana [place], [place] Phone Number: 11/08/2023, 3:58 PM  Clinical Narrative:     CSW called pt's friend [name] who states he does not have a final decision on SNF. He explains he will likely be able to make decision later today. CSW explained SNF auth process and anticipated DC once facility is chosen and [name] is approved. TOC to follow up for choice.    Expected Discharge Plan: Skilled Nursing Facility Barriers to Discharge: Continued Medical Work up, English as a second language teacher  Expected Discharge Plan and Services                                               Social Determinants of Health (SDOH) Interventions SDOH Screenings   Food Insecurity: No Food Insecurity (10/21/2023)  Housing: High Risk (10/21/2023)  Transportation Needs: No Transportation Needs (10/21/2023)  Utilities: Not At Risk (10/21/2023)  Alcohol Screen: Low Risk  (03/26/2023)  Depression (PHQ2-9): Low Risk  (03/26/2023)  Financial Resource Strain: Low Risk  (03/26/2023)  Physical Activity: Inactive (03/26/2023)  Social Connections: Moderately Isolated (10/21/2023)  Stress: No Stress Concern Present (03/26/2023)  Tobacco Use: High Risk (10/21/2023)  Health Literacy: Adequate Health Literacy (03/26/2023)    Readmission Risk Interventions    03/28/2023    2:57 PM 01/25/2023    3:40 PM  Readmission Risk Prevention Plan  Post Dischage Appt    Medication Screening    Transportation Screening Complete   Medication Review (RN Care Manager) Referral to Pharmacy   PCP or Specialist appointment within 3-5 days of discharge Complete   HRI or Home Care Consult Complete   SW Recovery Care/Counseling Consult Complete   Palliative Care Screening Not Applicable   Skilled Nursing Facility Not Applicable      Information is confidential and restricted. Go  to Review Flowsheets to unlock data.

## 2023-11-08 NOTE — Progress Notes (Signed)
 Occupational Therapy Treatment Patient Details Name: [name] [name] MRN: [id number] DOB: [birth year] Today's Date: 11/08/2023   History of present illness Patient is a 43 year old male with witnessed seizure at dialysis on 10/21/2023. Patient leaving AMA, and returning a few hours later via [facility]. CT clear, and EEG showing moderate diffuse encephalopathy. MRI 10/27/23 showing Multiple acute infarcts in the right frontal, parietal and posterior temporal cortex as well as the adjacent subcortical white matter and punctate acute infarcts in the left basal ganglia, thalamus and overlying white matter. PMH: ESRD on HD, antiphospholipid syndrome, HTN, seizure, stroke.   OT comments  Patient received in supine with friend present. Patient more lethargic this session, possible due to medication. Patient required min assist to initiate supine to sitting on EOB and CGA to stand and ambulate to sink for stability. Patient performed self care tasks seated this session due to unsteady when standing and required frequent cues for sequencing and to stay on tasks. Patient returned to supine at end of session and was setup for breakfast. Patient will benefit from continued inpatient follow up therapy, <3 hours/day. Acute OT to continue to follow to address established goals to facilitate DC to next venue of care.        If plan is discharge home, recommend the following:  Assistance with cooking/housework;Direct supervision/assist for medications management;Direct supervision/assist for financial management;Assist for transportation;Help with stairs or ramp for entrance;A little help with walking and/or transfers;A lot of help with bathing/dressing/bathroom   Equipment Recommendations  Other (comment) (defer)    Recommendations for Other Services      Precautions / Restrictions Precautions Precautions: Fall Recall of Precautions/Restrictions: Intact Restrictions Weight Bearing Restrictions Per Provider Order: No        Mobility Bed Mobility Overal bed mobility: Needs Assistance Bed Mobility: Supine to Sit, Sit to Supine     Supine to sit: Min assist, HOB elevated, Used rails Sit to supine: Supervision, Used rails   General bed mobility comments: required min assist to initiate supine to sit    Transfers Overall transfer level: Needs assistance Equipment used: Rolling walker (2 wheels) Transfers: Sit to/from Stand, Bed to chair/wheelchair/BSC Sit to Stand: Contact guard assist           General transfer comment: RW for stability and CGA     Balance Overall balance assessment: Needs assistance Sitting-balance support: Feet supported Sitting balance-Leahy Scale: Fair Sitting balance - Comments: EOB   Standing balance support: Bilateral upper extremity supported, During functional activity Standing balance-Leahy Scale: Poor Standing balance comment: poor standing balance on this date due to lethargic                           ADL either performed or assessed with clinical judgement   ADL Overall ADL's : Needs assistance/impaired Eating/Feeding: Set up Eating/Feeding Details (indicate cue type and reason): assistance with setup Grooming: Wash/dry hands;Wash/dry face;Oral care;Minimal assistance;Cueing for sequencing;Sitting Grooming Details (indicate cue type and reason): performed in sitting due to lethargic Upper Body Bathing: Minimal assistance;Cueing for sequencing;Sitting       Upper Body Dressing : Minimal assistance;Sitting Upper Body Dressing Details (indicate cue type and reason): to change gown                 Functional mobility during ADLs: Minimal assistance;Rolling walker (2 wheels) General ADL Comments: increased cues required for sequencing and staying on task    Extremity/Trunk Assessment  Vision       Perception     Praxis     Communication Communication Communication: Impaired Factors Affecting Communication:  Reduced clarity of speech;Difficulty expressing self (soft spoken)   Cognition Arousal: Lethargic, Suspect due to medications Behavior During Therapy: Flat affect Cognition: Cognition impaired   Orientation impairments: Time, Situation Awareness: Intellectual awareness impaired, Online awareness impaired Memory impairment (select all impairments): Short-term memory, Working memory Attention impairment (select first level of impairment): Focused attention Executive functioning impairment (select all impairments): Initiation, Organization, Sequencing, Reasoning, Problem solving OT - Cognition Comments: lethargic possible due t medication with patient requiring increased cues for sequencing and to stay on task                 Following commands: Impaired Following commands impaired: Follows one step commands inconsistently, Follows one step commands with increased time      Cueing   Cueing Techniques: Verbal cues, Tactile cues, Visual cues, Gestural cues  Exercises      Shoulder Instructions       General Comments VSS    Pertinent Vitals/ Pain       Pain Assessment Pain Assessment: No/denies pain Pain Intervention(s): Monitored during session  Home Living                                          Prior Functioning/Environment              Frequency  Min 2X/week        Progress Toward Goals  OT Goals(current goals can now be found in the care plan section)  Progress towards OT goals: Progressing toward goals  Acute Rehab OT Goals Patient Stated Goal: none stated OT Goal Formulation: With patient Time For Goal Achievement: 11/12/23 Potential to Achieve Goals: Fair ADL Goals Pt Will Perform Eating: with set-up;with supervision;sitting Pt Will Perform Grooming: with set-up;with supervision;sitting Pt Will Perform Upper Body Bathing: with min assist;sitting Additional ADL Goal #1: Pt will demosntrate selective attention in nondistracting  environment to increase independence wiht ALD tasks.  Plan      Co-evaluation                 AM-PAC OT "6 Clicks" Daily Activity     Outcome Measure   Help from another person eating meals?: A Little Help from another person taking care of personal grooming?: A Little Help from another person toileting, which includes using toliet, bedpan, or urinal?: A Little Help from another person bathing (including washing, rinsing, drying)?: A Lot Help from another person to put on and taking off regular upper body clothing?: A Little Help from another person to put on and taking off regular lower body clothing?: A Lot 6 Click Score: 16    End of Session Equipment Utilized During Treatment: Gait belt;Rolling walker (2 wheels)  OT Visit Diagnosis: Unsteadiness on feet (R26.81);Other abnormalities of gait and mobility (R26.89);Muscle weakness (generalized) (M62.81);Other symptoms and signs involving the nervous system (R29.898);Other symptoms and signs involving cognitive function   Activity Tolerance Patient tolerated treatment well   Patient Left in bed;with call bell/phone within reach;with bed alarm set;with family/visitor present   Nurse Communication Mobility status        Time: 1324-4010 OT Time Calculation (min): 30 min  Charges: OT General Charges $OT Visit: 1 Visit OT Treatments $Self Care/Home Management : 23-37 mins  Anitra Barn, OTA Acute  Rehabilitation Services  Office [phone]   Jovita Nipper 11/08/2023, 8:56 AM

## 2023-11-08 NOTE — Progress Notes (Addendum)
 Pt admitted for acute encephalopathy.  RN informed that pt had a fall tonight at around 8 PM. He fell while trying to get back to bed, assisted. No injuries noted and it was a soft fall.  Hemodynamically stable. -Need to emphasize on fall precaution and keep the bed alarm on. -Continue delirium precaution. -Requesting for bedside telemetry sitter to monitor for fall precaution and safety observation.

## 2023-11-08 NOTE — Progress Notes (Addendum)
 Pt was being assisted back to the chair after using the bathroom. Pt is very weak and sat right away when he got close to the chair and he didn't get to sit well on it and started to slowly slide down to the floor, assisted. VS taken and stable. MD informed and new orders placed. Pts friend informed of pts fall. Will continue to monitor pt.   11/08/23 1955  What Happened  Was fall witnessed? Yes  Who witnessed fall? Zoey Bidwell Claudio Culver, RN  Patients activity before fall ambulating-assisted;to/from bed, chair, or stretcher  Point of contact buttocks  Was patient injured? No  Provider Notification  Provider Name/Title S. Ulice Gamer, MD  Date Provider Notified 11/08/23  Time Provider Notified 2010  Method of Notification Page  Notification Reason Fall  Provider response No new orders  Date of Provider Response 11/08/23  Time of Provider Response 2023  Follow Up  Family notified Yes - comment Dyanna Glasgow)  Time family notified 2021  Additional tests No  Progress note created (see row info) Yes  Adult Fall Risk Assessment  Risk Factor Category (scoring not indicated) High fall risk per protocol (document High fall risk)  Patient Fall Risk Level High fall risk  Adult Fall Risk Interventions  Required Bundle Interventions *See Row Information* High fall risk - low, moderate, and high requirements implemented  Additional Interventions Use of appropriate toileting equipment (bedpan, BSC, etc.)  Screening for Fall Injury Risk (To be completed on HIGH fall risk patients) - Assessing Need for Floor Mats  Risk For Fall Injury- Criteria for Floor Mats Confusion/dementia (+NuDESC, CIWA, TBI, etc.);Noncompliant with safety precautions  Will Implement Floor Mats Yes  Pain Assessment  Pain Scale 0-10  Pain Score 0  PCA/Epidural/Spinal Assessment  Respiratory Pattern Regular;Unlabored  Neurological  Neuro (WDL) X  Level of Consciousness Alert  Orientation Level Oriented to person;Oriented to  place;Disoriented to situation  Cognition Memory impairment;Poor attention/concentration;Poor judgement;Poor safety awareness;Impulsive  Speech Clear  R Pupil Size (mm) 3  R Pupil Shape Round  R Pupil Reaction Brisk  L Pupil Size (mm) 3  L Pupil Shape Round  L Pupil Reaction Brisk  R Hand Grip Moderate  L Hand Grip Moderate  R Foot Dorsiflexion Moderate  L Foot Dorsiflexion Moderate  R Foot Plantar Flexion Moderate  L Foot Plantar Flexion Moderate  RUE Motor Response Purposeful movement;Responds to commands  RUE Sensation Full sensation  RUE Motor Strength 4  LUE Motor Response Purposeful movement;Responds to commands  LUE Sensation Full sensation  LUE Motor Strength 4  RLE Motor Response Purposeful movement;Responds to commands  RLE Sensation Full sensation  RLE Motor Strength 4  LLE Motor Response Purposeful movement  LLE Sensation Full sensation  LLE Motor Strength 4  Neuro Symptoms Forgetful  Neuro symptoms relieved by Rest  Glasgow Coma Scale  Eye Opening 4  Best Verbal Response (NON-intubated) 4  Best Motor Response 6  Glasgow Coma Scale Score 14  NIH Stroke Scale   Dizziness Present No  Headache Present No  Interval Shift assessment  Level of Consciousness (1a.)    0  LOC Questions (1b. )    0  LOC Commands (1c. )    0  Best Gaze (2. )   0  Visual (3. )   1  Facial Palsy (4. )     0  Motor Arm, Left (5a. )    1  Motor Arm, Right (5b. )  0  Motor Leg, Left (6a. )  1  Motor Leg, Right (6b. )  0  Limb Ataxia (7. ) 1  Sensory (8. )   1  Best Language (9. )   1  Dysarthria (10. ) 1  Extinction/Inattention (11.)    0  Complete NIHSS TOTAL 7  Musculoskeletal  Musculoskeletal (WDL) X  Assistive Device Front wheel walker  Generalized Weakness Yes  Weight Bearing Restrictions Per Provider Order No  Integumentary  Integumentary (WDL) WDL  Skin Color Appropriate for ethnicity  Skin Condition Dry  Skin Integrity Intact  Skin Turgor Non-tenting

## 2023-11-08 NOTE — Plan of Care (Signed)
 Problem: Education: Goal: Knowledge of General Education information will improve Description: Including pain rating scale, medication(s)/side effects and non-pharmacologic comfort measures 11/08/2023 1037 by Diamond Formica, RN Outcome: Progressing 11/08/2023 1037 by Diamond Formica, RN Outcome: Progressing   Problem: Health Behavior/Discharge Planning: Goal: Ability to manage health-related needs will improve 11/08/2023 1037 by Diamond Formica, RN Outcome: Progressing 11/08/2023 1037 by Diamond Formica, RN Outcome: Progressing   Problem: Clinical Measurements: Goal: Ability to maintain clinical measurements within normal limits will improve 11/08/2023 1037 by Diamond Formica, RN Outcome: Progressing 11/08/2023 1037 by Diamond Formica, RN Outcome: Progressing Goal: Will remain free from infection 11/08/2023 1037 by Diamond Formica, RN Outcome: Progressing 11/08/2023 1037 by Diamond Formica, RN Outcome: Progressing Goal: Diagnostic test results will improve 11/08/2023 1037 by Diamond Formica, RN Outcome: Progressing 11/08/2023 1037 by Diamond Formica, RN Outcome: Progressing Goal: Respiratory complications will improve 11/08/2023 1037 by Diamond Formica, RN Outcome: Progressing 11/08/2023 1037 by Diamond Formica, RN Outcome: Progressing Goal: Cardiovascular complication will be avoided 11/08/2023 1037 by Diamond Formica, RN Outcome: Progressing 11/08/2023 1037 by Diamond Formica, RN Outcome: Progressing   Problem: Activity: Goal: Risk for activity intolerance will decrease 11/08/2023 1037 by Diamond Formica, RN Outcome: Progressing 11/08/2023 1037 by Diamond Formica, RN Outcome: Progressing   Problem: Nutrition: Goal: Adequate nutrition will be maintained 11/08/2023 1037 by Diamond Formica, RN Outcome: Progressing 11/08/2023 1037 by Diamond Formica, RN Outcome: Progressing   Problem: Coping: Goal: Level of anxiety will decrease 11/08/2023 1037 by Diamond Formica, RN Outcome: Progressing 11/08/2023 1037 by  Diamond Formica, RN Outcome: Progressing   Problem: Elimination: Goal: Will not experience complications related to bowel motility 11/08/2023 1037 by Diamond Formica, RN Outcome: Progressing 11/08/2023 1037 by Diamond Formica, RN Outcome: Progressing Goal: Will not experience complications related to urinary retention 11/08/2023 1037 by Diamond Formica, RN Outcome: Progressing 11/08/2023 1037 by Diamond Formica, RN Outcome: Progressing   Problem: Pain Managment: Goal: General experience of comfort will improve and/or be controlled 11/08/2023 1037 by Diamond Formica, RN Outcome: Progressing 11/08/2023 1037 by Diamond Formica, RN Outcome: Progressing   Problem: Safety: Goal: Ability to remain free from injury will improve 11/08/2023 1037 by Diamond Formica, RN Outcome: Progressing 11/08/2023 1037 by Diamond Formica, RN Outcome: Progressing   Problem: Skin Integrity: Goal: Risk for impaired skin integrity will decrease 11/08/2023 1037 by Diamond Formica, RN Outcome: Progressing 11/08/2023 1037 by Diamond Formica, RN Outcome: Progressing   Problem: Education: Goal: Knowledge of disease and its progression will improve 11/08/2023 1037 by Diamond Formica, RN Outcome: Progressing 11/08/2023 1037 by Diamond Formica, RN Outcome: Progressing Goal: Individualized Educational Video(s) 11/08/2023 1037 by Diamond Formica, RN Outcome: Progressing 11/08/2023 1037 by Diamond Formica, RN Outcome: Progressing   Problem: Fluid Volume: Goal: Compliance with measures to maintain balanced fluid volume will improve 11/08/2023 1037 by Diamond Formica, RN Outcome: Progressing 11/08/2023 1037 by Diamond Formica, RN Outcome: Progressing   Problem: Health Behavior/Discharge Planning: Goal: Ability to manage health-related needs will improve 11/08/2023 1037 by Diamond Formica, RN Outcome: Progressing 11/08/2023 1037 by Diamond Formica, RN Outcome: Progressing   Problem: Clinical Measurements: Goal: Complications related to the disease  process, condition or treatment will be avoided or minimized 11/08/2023 1037 by Diamond Formica, RN Outcome: Progressing 11/08/2023 1037 by Diamond Formica, RN Outcome: Progressing   Problem: Nutritional: Goal: Ability to make healthy dietary choices will improve 11/08/2023 1037 by Diamond Formica, RN Outcome: Progressing 11/08/2023 1037 by Diamond Formica, RN Outcome: Progressing   Problem: Education: Goal: Knowledge of disease or condition will improve 11/08/2023 1037  by Diamond Formica, RN Outcome: Progressing 11/08/2023 1037 by Diamond Formica, RN Outcome: Progressing Goal: Knowledge of secondary prevention will improve (MUST DOCUMENT ALL) 11/08/2023 1037 by Diamond Formica, RN Outcome: Progressing 11/08/2023 1037 by Diamond Formica, RN Outcome: Progressing Goal: Knowledge of patient specific risk factors will improve (DELETE if not current risk factor) 11/08/2023 1037 by Diamond Formica, RN Outcome: Progressing 11/08/2023 1037 by Diamond Formica, RN Outcome: Progressing   Problem: Ischemic Stroke/TIA Tissue Perfusion: Goal: Complications of ischemic stroke/TIA will be minimized 11/08/2023 1037 by Diamond Formica, RN Outcome: Progressing 11/08/2023 1037 by Diamond Formica, RN Outcome: Progressing   Problem: Coping: Goal: Will verbalize positive feelings about self 11/08/2023 1037 by Diamond Formica, RN Outcome: Progressing 11/08/2023 1037 by Diamond Formica, RN Outcome: Progressing Goal: Will identify appropriate support needs 11/08/2023 1037 by Diamond Formica, RN Outcome: Progressing 11/08/2023 1037 by Diamond Formica, RN Outcome: Progressing   Problem: Health Behavior/Discharge Planning: Goal: Ability to manage health-related needs will improve 11/08/2023 1037 by Diamond Formica, RN Outcome: Progressing 11/08/2023 1037 by Diamond Formica, RN Outcome: Progressing Goal: Goals will be collaboratively established with patient/family 11/08/2023 1037 by Diamond Formica, RN Outcome: Progressing 11/08/2023 1037 by  Diamond Formica, RN Outcome: Progressing   Problem: Self-Care: Goal: Ability to participate in self-care as condition permits will improve 11/08/2023 1037 by Diamond Formica, RN Outcome: Progressing 11/08/2023 1037 by Diamond Formica, RN Outcome: Progressing Goal: Verbalization of feelings and concerns over difficulty with self-care will improve 11/08/2023 1037 by Diamond Formica, RN Outcome: Progressing 11/08/2023 1037 by Diamond Formica, RN Outcome: Progressing Goal: Ability to communicate needs accurately will improve 11/08/2023 1037 by Diamond Formica, RN Outcome: Progressing 11/08/2023 1037 by Diamond Formica, RN Outcome: Progressing   Problem: Nutrition: Goal: Risk of aspiration will decrease 11/08/2023 1037 by Diamond Formica, RN Outcome: Progressing 11/08/2023 1037 by Diamond Formica, RN Outcome: Progressing Goal: Dietary intake will improve 11/08/2023 1037 by Diamond Formica, RN Outcome: Progressing 11/08/2023 1037 by Diamond Formica, RN Outcome: Progressing

## 2023-11-08 NOTE — Plan of Care (Signed)
  Problem: Education: Goal: Knowledge of General Education information will improve Description: Including pain rating scale, medication(s)/side effects and non-pharmacologic comfort measures Outcome: Not Progressing   Problem: Health Behavior/Discharge Planning: Goal: Ability to manage health-related needs will improve Outcome: Not Progressing   Problem: Clinical Measurements: Goal: Ability to maintain clinical measurements within normal limits will improve Outcome: Not Progressing Goal: Will remain free from infection Outcome: Not Progressing Goal: Diagnostic test results will improve Outcome: Not Progressing Goal: Respiratory complications will improve Outcome: Not Progressing Goal: Cardiovascular complication will be avoided Outcome: Not Progressing   Problem: Activity: Goal: Risk for activity intolerance will decrease Outcome: Not Progressing   Problem: Nutrition: Goal: Adequate nutrition will be maintained Outcome: Not Progressing   Problem: Coping: Goal: Level of anxiety will decrease Outcome: Not Progressing   Problem: Elimination: Goal: Will not experience complications related to bowel motility Outcome: Not Progressing Goal: Will not experience complications related to urinary retention Outcome: Not Progressing   Problem: Pain Managment: Goal: General experience of comfort will improve and/or be controlled Outcome: Not Progressing   Problem: Safety: Goal: Ability to remain free from injury will improve Outcome: Not Progressing   Problem: Skin Integrity: Goal: Risk for impaired skin integrity will decrease Outcome: Not Progressing   Problem: Education: Goal: Knowledge of disease and its progression will improve Outcome: Not Progressing Goal: Individualized Educational Video(s) Outcome: Not Progressing   Problem: Fluid Volume: Goal: Compliance with measures to maintain balanced fluid volume will improve Outcome: Not Progressing   Problem: Health  Behavior/Discharge Planning: Goal: Ability to manage health-related needs will improve Outcome: Not Progressing   Problem: Nutritional: Goal: Ability to make healthy dietary choices will improve Outcome: Not Progressing   Problem: Clinical Measurements: Goal: Complications related to the disease process, condition or treatment will be avoided or minimized Outcome: Not Progressing   Problem: Education: Goal: Knowledge of disease or condition will improve Outcome: Not Progressing Goal: Knowledge of secondary prevention will improve (MUST DOCUMENT ALL) Outcome: Not Progressing Goal: Knowledge of patient specific risk factors will improve (DELETE if not current risk factor) Outcome: Not Progressing   Problem: Ischemic Stroke/TIA Tissue Perfusion: Goal: Complications of ischemic stroke/TIA will be minimized Outcome: Not Progressing   Problem: Coping: Goal: Will verbalize positive feelings about self Outcome: Not Progressing Goal: Will identify appropriate support needs Outcome: Not Progressing   Problem: Health Behavior/Discharge Planning: Goal: Ability to manage health-related needs will improve Outcome: Not Progressing Goal: Goals will be collaboratively established with patient/family Outcome: Not Progressing   Problem: Self-Care: Goal: Ability to participate in self-care as condition permits will improve Outcome: Not Progressing Goal: Verbalization of feelings and concerns over difficulty with self-care will improve Outcome: Not Progressing Goal: Ability to communicate needs accurately will improve Outcome: Not Progressing   Problem: Nutrition: Goal: Risk of aspiration will decrease Outcome: Not Progressing Goal: Dietary intake will improve Outcome: Not Progressing

## 2023-11-08 NOTE — Progress Notes (Addendum)
 [facility] Progress Note   Subjective:    Seen and examined patient at bedside. Appears sleepy today but opens eyes and responds to voice. On RA. Plan for HD this afternoon.  Objective Vitals:   11/07/23 2023 11/08/23 0003 11/08/23 0430 11/08/23 0825  BP: 119/78 (!) 120/96 134/81 133/82  Pulse: 73 90 66 83  Resp: 16 16  14   Temp: 98.3 F (36.8 C) 98.3 F (36.8 C) (!) 97.5 F (36.4 C) 97.6 F (36.4 C)  TempSrc: Oral Oral Oral Oral  SpO2: 100% 96% 100% 98%  Weight:      Height:       Physical Exam General: alert male in NAD, sleepy Heart: RRR, no murmurs, rubs or gallops Lungs:CTA bilaterally, respirations unlabored on RA Abdomen: Soft, non-distended, +BS Extremities: No LE edema bilaterally Dialysis Access:  R internal jugular [facility]  Filed Weights   11/06/23 1312 11/06/23 1725 11/06/23 1727  Weight: 108.6 kg 108.2 kg 106 kg   No intake or output data in the 24 hours ending 11/08/23 1121  Additional Objective Labs: Basic Metabolic Panel: Recent Labs  Lab 11/06/23 0536 11/07/23 0441 11/08/23 0629  NA 131* 133* 136  K 4.3 4.0 4.5  CL 91* 95* 97*  CO2 22 25 22   GLUCOSE 112* 102* 64*  BUN 64* 38* 53*  CREATININE 15.96* 12.09* 15.74*  CALCIUM  10.2 10.3 10.2  PHOS 7.8* 5.6* 7.8*   Liver Function Tests: Recent Labs  Lab 11/06/23 0536 11/07/23 0441 11/08/23 0629  ALBUMIN  3.9 3.8 3.7   No results for input(s): "LIPASE", "AMYLASE" in the last 168 hours. CBC: Recent Labs  Lab 11/02/23 1113 11/06/23 0536  WBC 7.1 6.9  HGB 12.4* 13.2  HCT 36.0* 39.0  MCV 89.8 90.3  PLT 60* 72*   Blood Culture    Component Value Date/Time   SDES BLOOD RIGHT HAND 01/19/2023 0213   SPECREQUEST  01/19/2023 1610    BOTTLES DRAWN AEROBIC AND ANAEROBIC Blood Culture results may not be optimal due to an excessive volume of blood received in culture bottles   CULT  01/19/2023 0213    NO GROWTH 5 DAYS Performed at [facility], [address],  [place], [place] [address]    REPTSTATUS 01/24/2023 FINAL 01/19/2023 4098    Cardiac Enzymes: No results for input(s): "CKTOTAL", "CKMB", "CKMBINDEX", "TROPONINI" in the last 168 hours. CBG: No results for input(s): "GLUCAP" in the last 168 hours. Iron  Studies: No results for input(s): "IRON ", "TIBC", "TRANSFERRIN", "FERRITIN" in the last 72 hours. Lab Results  Component Value Date   INR 2.5 (H) 04/20/2023   INR 2.6 (H) 04/19/2023   INR 2.7 (H) 04/18/2023   PROTIME 28.8 (H) 11/04/2013   PROTIME 21.6 (H) 10/14/2013   PROTIME 18.0 (H) 09/23/2013   Studies/Results: No results found.  Medications:   apixaban   5 mg Oral BID   atorvastatin   40 mg Oral Daily   calcitRIOL   2.5 mcg Oral Q M,W,F-HD   Chlorhexidine  Gluconate Cloth  6 each Topical Q0600   Chlorhexidine  Gluconate Cloth  6 each Topical Q0600   cinacalcet   150 mg Oral Q M,W,F-HD   divalproex   1,000 mg Oral Q12H   feeding supplement (NEPRO CARB STEADY)  237 mL Oral BID BM   levETIRAcetam   500 mg Oral BID   levETIRAcetam   500 mg Oral Q M,W,F-HD   multivitamin  1 tablet Oral QHS   sevelamer  carbonate  3,200 mg Oral TID WC   sodium chloride  flush  10-40 mL Intracatheter Q12H    Dialysis Orders: MWF [facility] 4h   B400   110.5kg  RIJ TDC   Heparin  none Mircera: none Calcitriol  2.5mcg PO qHD - last dose 11/01/23 Sensipar  150mg  with HD - last dose 11/01/23  Assessment/Plan: Seizure disorder - Neurology following; EEG suggestive of moderate diffuse encephalopathy, no seizures noted and MRI brain no acute changes. Had seizures on HD 5/09. Is on keppra , depakote  and vimpat  now. Per neuro/ pmd.  Acute encephalopathy - doing better, still some deficits per neurology. CT clear, and EEG showing moderate diffuse encephalopathy. 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 His amlodipine  was stopped 5/22 due to low BP. Continue to monitor his BP. Next HD this afternoon. HTN/ volume  - Patients BP has been low but he is asymptomatic.  Appears euvolemic on exam, under his prior EDW.  Anemia of CKD - Hgb 13.2. No Fe/ESA is indicated at this time. Secondary hyperparathyroidism - Calcium  controlled. Phos above goal but coming down with binder. Continue calcitriol , sensipar  and renvela  Nutrition - Renal diet with fluid restriction, does not seem to be eating much. Continue protein supplements. Albumin  3.8.   Jadene Maxwell, NP [facility] 11/08/2023,11:21 AM  LOS: 18 days    Seen and examined independently.  Agree with note and exam as documented above by physician extender and as noted here.  General adult male in bed in no acute distress HEENT normocephalic atraumatic extraocular movements intact sclera anicteric Neck supple trachea midline Lungs clear to auscultation bilaterally normal work of breathing at rest  Heart S1S2 no rub Abdomen soft nontender nondistended Extremities no edema  Psych normal mood and affect Neuro oriented to person and [facility]; "2005"; conversant but flat affect Access RIJ tunn catheter old AVF LUE no bruit   ESRD - on HD per MWF schedule   Encephalopathy - improved but still present    HTN - I stopped amlodipine  due to hypotension    Anemia of CKD - no ESA indicated    Disposition per primary team - for SNF per charting  Nan Aver, MD 11/08/2023  1:27 PM

## 2023-11-08 NOTE — Progress Notes (Signed)
 Patient trying to get out of the bed. Agitated and trying to pull out internal jugular catheter. Informed Dr. Sundil of the situation with orders made and carried out.

## 2023-11-08 NOTE — TOC Progression Note (Signed)
 LATE NOTE SUBMISSION    Transition of Care [facility]) - Progression Note    Patient Details  Name: [name] [name] MRN: [id number] Date of Birth: December 28, 1980  Transition of Care [facility]) CM/SW Contact  Tandy Fam, [place] Phone Number: 11/08/2023, 12:36 PM  Clinical Narrative:   CSW updated by medical team about agitation overnight, still remains confused. CSW attempted to meet with patient's friend at bedside, but friend was gone when CSW went to room. Patient unable to participate in decision making at this time, not fully oriented. CSW to follow.    Expected Discharge Plan: Skilled Nursing Facility Barriers to Discharge: Continued Medical Work up, English as a second language teacher  Expected Discharge Plan and Services                                               Social Determinants of Health (SDOH) Interventions SDOH Screenings   Food Insecurity: No Food Insecurity (10/21/2023)  Housing: High Risk (10/21/2023)  Transportation Needs: No Transportation Needs (10/21/2023)  Utilities: Not At Risk (10/21/2023)  Alcohol Screen: Low Risk  (03/26/2023)  Depression (PHQ2-9): Low Risk  (03/26/2023)  Financial Resource Strain: Low Risk  (03/26/2023)  Physical Activity: Inactive (03/26/2023)  Social Connections: Moderately Isolated (10/21/2023)  Stress: No Stress Concern Present (03/26/2023)  Tobacco Use: High Risk (10/21/2023)  Health Literacy: Adequate Health Literacy (03/26/2023)    Readmission Risk Interventions    03/28/2023    2:57 PM 01/25/2023    3:40 PM  Readmission Risk Prevention Plan  Post Dischage Appt    Medication Screening    Transportation Screening Complete   Medication Review (RN Care Manager) Referral to Pharmacy   PCP or Specialist appointment within 3-5 days of discharge Complete   HRI or Home Care Consult Complete   SW Recovery Care/Counseling Consult Complete   Palliative Care Screening Not Applicable   Skilled Nursing Facility Not Applicable      Information is  confidential and restricted. Go to Review Flowsheets to unlock data.

## 2023-11-08 NOTE — Progress Notes (Signed)
 Pt being transported to HD unit for HD session tonight.

## 2023-11-08 NOTE — TOC Progression Note (Signed)
 Transition of Care [facility]) - Progression Note    Patient Details  Name: [name] [name] MRN: [id number] Date of Birth: 01/24/1981  Transition of Care [facility]) CM/SW Contact  Tandy Fam, [place] Phone Number: 11/08/2023, 12:37 PM  Clinical Narrative:   CSW updated by RN that patient's friend was again at bedside, but friend was again gone when CSW went to the room. CSW asked patient if his friend was returning, and patient thought his friend was still in the room; did not realize that he had left. Patient still remains confused. CSW attempted to reach Rashawn via phone, no response. CSW to follow.    Expected Discharge Plan: Skilled Nursing Facility Barriers to Discharge: Continued Medical Work up, English as a second language teacher  Expected Discharge Plan and Services                                               Social Determinants of Health (SDOH) Interventions SDOH Screenings   Food Insecurity: No Food Insecurity (10/21/2023)  Housing: High Risk (10/21/2023)  Transportation Needs: No Transportation Needs (10/21/2023)  Utilities: Not At Risk (10/21/2023)  Alcohol Screen: Low Risk  (03/26/2023)  Depression (PHQ2-9): Low Risk  (03/26/2023)  Financial Resource Strain: Low Risk  (03/26/2023)  Physical Activity: Inactive (03/26/2023)  Social Connections: Moderately Isolated (10/21/2023)  Stress: No Stress Concern Present (03/26/2023)  Tobacco Use: High Risk (10/21/2023)  Health Literacy: Adequate Health Literacy (03/26/2023)    Readmission Risk Interventions    03/28/2023    2:57 PM 01/25/2023    3:40 PM  Readmission Risk Prevention Plan  Post Dischage Appt    Medication Screening    Transportation Screening Complete   Medication Review (RN Care Manager) Referral to Pharmacy   PCP or Specialist appointment within 3-5 days of discharge Complete   HRI or Home Care Consult Complete   SW Recovery Care/Counseling Consult Complete   Palliative Care Screening Not Applicable   Skilled  Nursing Facility Not Applicable      Information is confidential and restricted. Go to Review Flowsheets to unlock data.

## 2023-11-08 NOTE — TOC Progression Note (Signed)
 Transition of Care [facility]) - Progression Note    Patient Details  Name: MONIQUE GIFT MRN: [id number] Date of Birth: 1981/03/31  Transition of Care [facility]) CM/SW Contact  Tandy Fam, [place] Phone Number: 11/08/2023, 12:39 PM  Clinical Narrative:   CSW spoke with Rashawn via phone to discuss disposition for patient. Rashawn indicated that the patient is not at his baseline; he knew something was wrong when he could not figure out how to get himself ready for dialysis on date of admission. Per [name], he has noticed a decline in patient over the past year since he had to stop working and driving, he has noticed that he will sometimes forget whether or not he's taken his medications and it will aggravate him that he can't remember if he did or not. Rashawn in agreement to help the patient with deciding on SNF. CSW provided Rashawn with bed offers, Rashawn to review and determine choice. CSW to follow.    Expected Discharge Plan: Skilled Nursing Facility Barriers to Discharge: Continued Medical Work up, English as a second language teacher  Expected Discharge Plan and Services                                               Social Determinants of Health (SDOH) Interventions SDOH Screenings   Food Insecurity: No Food Insecurity (10/21/2023)  Housing: High Risk (10/21/2023)  Transportation Needs: No Transportation Needs (10/21/2023)  Utilities: Not At Risk (10/21/2023)  Alcohol Screen: Low Risk  (03/26/2023)  Depression (PHQ2-9): Low Risk  (03/26/2023)  Financial Resource Strain: Low Risk  (03/26/2023)  Physical Activity: Inactive (03/26/2023)  Social Connections: Moderately Isolated (10/21/2023)  Stress: No Stress Concern Present (03/26/2023)  Tobacco Use: High Risk (10/21/2023)  Health Literacy: Adequate Health Literacy (03/26/2023)    Readmission Risk Interventions    03/28/2023    2:57 PM 01/25/2023    3:40 PM  Readmission Risk Prevention Plan  Post Dischage Appt    Medication Screening     Transportation Screening Complete   Medication Review (RN Care Manager) Referral to Pharmacy   PCP or Specialist appointment within 3-5 days of discharge Complete   HRI or Home Care Consult Complete   SW Recovery Care/Counseling Consult Complete   Palliative Care Screening Not Applicable   Skilled Nursing Facility Not Applicable      Information is confidential and restricted. Go to Review Flowsheets to unlock data.

## 2023-11-08 NOTE — Progress Notes (Signed)
 PROGRESS NOTE [name] [name]  ZOX:[id number] DOB: 27-Nov-1980 DOA: 10/20/2023 PCP: Collins Dean, NP  Brief Narrative/Hospital Course: 43 y.o. male with history of ESRD on hemodialysis MWF, hypertension, prior stroke, history of seizures, lupus anticoagulant brought to the ER on 5/5 after patient had a seizure, encephalopathy. Neurology was consulted. Patient remains encephalopathic.  Had breakthrough seizure 5/9 while on HD, and moved to PCU and found to have new strokes in MRI  The patient has been seen by neurology and nephrology. EEG is suggestive of moderate diffuse encephalopathy.  Sharp transients were noted in the right frontal region. Per neurology ADDED ON ltm eeg for further evaluation. MRI on 5/6 showed no evidence of acute intracranial abnormality, and multiple remote infarcts.  PTOT following  Subjective Patient seen and examined at bedside.  Alert awake neurologically not much changed. Awaiting placement.  Assessment and Plan:  Acute right MCA patchy infarct/ left thalamic and caudate head punctuate infarct: Likely embolic, secondary to antiphospholipid syndrome with underdosed Eliquis . Neurology following, MRI brain 5/6 no acute infarct, MRI brain 5/11 multiple acute infarcts S/p completion of stroke workup: MRA patency of major intracranial arteries. A1c 5.6.LDL: 75.  HDL: 26.  Carotid duplex no significant finding TTE:EF 70% mitral valve calcified, restricted possible rheumatic disease, mod MS mild MR Noncompliant anticoagulation at home on 2.5 ELIQUIS  BID Continue Eliquis  at 5 mg twice daily, started on Lipitor 40 Cont aggressive stroke risk modification , PTOT W/ Plan for SNF  Acute encephalopathy Seizure disorder w/ breakthrough seizure on 10/25/23: Initially thought due to postictal state, but persisted. S.p MRI- most llikely that the encephalopathy is 2/2 strokes involving the frontal region. Work up [employer]: WNL.HIV: Nonreactive.B12: 539 B1 nl, RPR  nonreactive. B1: Within EEG notable for BL sharps as well as right frontal subclinical seizure.Off high-dose.  Mentation overall stable alert awake following commands appropriately not agitated.  Neurology signed off, continue on increased dose of Keppra  500 twice daily and 500 MWF, Depakote  1000 mg q12  ESRD on HD MWF: Secondary hyperparathyroidism/MBD Anemia of CKD Nephrology following continue HD-had HD 5/20 without any issues or hypotension  Continue to monitor calcium  close and hemoglobin intermittently. Continue Renvela , calcitriol  and Sensipar    Hypertension Well-controlled . Off amlodipine  and Coreg  due to hypotension   Functional impairment Debility/deconditioning: Continue PT/OT -recommending skilled nursing facility> medically stable and awaiting for placement  Class I Obesity w/ Body mass index is 31.69 kg/m.: Will benefit with PCP follow-up, weight loss,healthy lifestyle and outpatient sleep eval if not done.  DVT prophylaxis:  Code Status:   Code Status: Full Code Family Communication: plan of care discussed with patient at bedside. Patient status is: Remains hospitalized because of severity of illness Level of care: Progressive   Dispo: The patient is from: home. Unmarried. Next of kin is [name] per him.            Anticipated disposition: snf pending Objective: Vitals last 24 hrs: Vitals:   11/08/23 0003 11/08/23 0430 11/08/23 0825 11/08/23 1216  BP: (!) 120/96 134/81 133/82 111/75  Pulse: 90 66 83 76  Resp: 16  14 14   Temp: 98.3 F (36.8 C) (!) 97.5 F (36.4 C) 97.6 F (36.4 C) 98.4 F (36.9 C)  TempSrc: Oral Oral Oral Oral  SpO2: 96% 100% 98% 98%  Weight:      Height:        Physical Examination:  General exam: alert awake, oriented at baseline, slow to respond-baseline HEENT:Oral mucosa moist, Ear/Nose WNL grossly Respiratory  system: Bilaterally clear BS,no use of accessory muscle Cardiovascular system: S1 & S2 +, No  JVD. Gastrointestinal system: Abdomen soft,NT,ND, BS+ Nervous System: Alert, awake, moving all extremities,and following commands. Extremities: LE edema neg,distal peripheral pulses palpable and warm.  Skin: No rashes,no icterus. MSK: Normal muscle bulk,tone, power  Right  internal jugular DC w/ dressing intact  Data Reviewed: I have personally reviewed following labs and imaging studies ( see epic result tab) CBC: Recent Labs  Lab 11/02/23 1113 11/06/23 0536  WBC 7.1 6.9  HGB 12.4* 13.2  HCT 36.0* 39.0  MCV 89.8 90.3  PLT 60* 72*   CMP: Recent Labs  Lab 11/02/23 1114 11/04/23 0814 11/06/23 0536 11/07/23 0441 11/08/23 0629  NA 134* 132* 131* 133* 136  K 4.7 4.6 4.3 4.0 4.5  CL 93* 90* 91* 95* 97*  CO2 23 23 22 25 22   GLUCOSE 83 77 112* 102* 64*  BUN 80* 76* 64* 38* 53*  CREATININE 20.79* 18.12* 15.96* 12.09* 15.74*  CALCIUM  9.5 10.3 10.2 10.3 10.2  PHOS 9.5* 9.5* 7.8* 5.6* 7.8*   GFR: Estimated Creatinine Clearance: 7.5 mL/min (A) (by C-G formula based on SCr of 15.74 mg/dL (H)). Recent Labs  Lab 11/02/23 1114 11/04/23 0814 11/06/23 0536 11/07/23 0441 11/08/23 0629  ALBUMIN  3.5 3.5 3.9 3.8 3.7    No results for input(s): "LIPASE", "AMYLASE" in the last 168 hours.  No results for input(s): "AMMONIA" in the last 168 hours.  Coagulation Profile: No results for input(s): "INR", "PROTIME" in the last 168 hours. Unresulted Labs (From admission, onward)     Start     Ordered   11/06/23 0500  Renal function panel  Daily,   R     Question:  Specimen collection method  Answer:  Lab=Lab collect   11/05/23 0834   Signed and Held  Renal function panel  Once,   R       Question:  Specimen collection method  Answer:  Lab=Lab collect   Signed and Held   Signed and Held  CBC  Once,   R       Question:  Specimen collection method  Answer:  Lab=Lab collect   Signed and Held           Antimicrobials/Microbiology: Anti-infectives (From admission, onward)    None          Component Value Date/Time   SDES BLOOD RIGHT HAND 01/19/2023 0213   SPECREQUEST  01/19/2023 1610    BOTTLES DRAWN AEROBIC AND ANAEROBIC Blood Culture results may not be optimal due to an excessive volume of blood received in culture bottles   CULT  01/19/2023 0213    NO GROWTH 5 DAYS Performed at [facility], [address], [place], [place] [address]    REPTSTATUS 01/24/2023 FINAL 01/19/2023 4098     Medications reviewed:  Scheduled Meds:  apixaban   5 mg Oral BID   atorvastatin   40 mg Oral Daily   calcitRIOL   2.5 mcg Oral Q M,W,F-HD   Chlorhexidine  Gluconate Cloth  6 each Topical Q0600   Chlorhexidine  Gluconate Cloth  6 each Topical Q0600   cinacalcet   150 mg Oral Q M,W,F-HD   divalproex   1,000 mg Oral Q12H   feeding supplement (NEPRO CARB STEADY)  237 mL Oral BID BM   levETIRAcetam   500 mg Oral BID   levETIRAcetam   500 mg Oral Q M,W,F-HD   multivitamin  1 tablet Oral QHS   sevelamer  carbonate  3,200 mg Oral TID  WC   sodium chloride  flush  10-40 mL Intracatheter Q12H   Continuous Infusions:  anticoagulant sodium citrate       Edwena Graham, MD [facility] 11/08/2023, 12:49 PM

## 2023-11-09 DIAGNOSIS — G9340 Encephalopathy, unspecified: Secondary | ICD-10-CM | POA: Diagnosis not present

## 2023-11-09 LAB — RENAL FUNCTION PANEL
Albumin: 3.7 g/dL (ref 3.5–5.0)
Anion gap: 14 (ref 5–15)
BUN: 35 mg/dL — ABNORMAL HIGH (ref 6–20)
CO2: 21 mmol/L — ABNORMAL LOW (ref 22–32)
Calcium: 10 mg/dL (ref 8.9–10.3)
Chloride: 98 mmol/L (ref 98–111)
Creatinine, Ser: 11.72 mg/dL — ABNORMAL HIGH (ref 0.61–1.24)
GFR, Estimated: 5 mL/min — ABNORMAL LOW (ref >60)
Glucose, Bld: 74 mg/dL (ref 70–99)
Phosphorus: 5 mg/dL — ABNORMAL HIGH (ref 2.5–4.6)
Potassium: 4.7 mmol/L (ref 3.5–5.1)
Sodium: 133 mmol/L — ABNORMAL LOW (ref 135–145)

## 2023-11-09 MED ORDER — HALOPERIDOL LACTATE 5 MG/ML IJ SOLN: 1.0000 mg | Freq: Four times a day (QID) | INTRAMUSCULAR | Status: DC | PRN

## 2023-11-09 MED ORDER — HEPARIN SODIUM (PORCINE) 1000 UNIT/ML IJ SOLN
INTRAMUSCULAR | Status: AC
  Filled 2023-11-09: qty 2

## 2023-11-09 MED ORDER — QUETIAPINE FUMARATE 25 MG PO TABS
25.0000 mg | ORAL_TABLET | Freq: Every day | ORAL | Status: DC
  Administered 2023-11-09 – 2023-11-11 (×3): 25 mg via ORAL
  Filled 2023-11-09 (×3): qty 1

## 2023-11-09 MED ORDER — DIPHENHYDRAMINE HCL 50 MG/ML IJ SOLN
50.0000 mg | Freq: Once | INTRAMUSCULAR | Status: AC
  Administered 2023-11-09: 50 mg via INTRAMUSCULAR
  Filled 2023-11-09: qty 1

## 2023-11-09 MED ORDER — SODIUM CHLORIDE 0.9 % IV BOLUS
250.0000 mL | Freq: Once | INTRAVENOUS | Status: AC
  Administered 2023-11-09: 250 mL via INTRAVENOUS

## 2023-11-09 MED ORDER — LORAZEPAM 2 MG/ML IJ SOLN: 1.0000 mg | INTRAMUSCULAR | Status: DC

## 2023-11-09 MED ORDER — LORAZEPAM 2 MG/ML IJ SOLN
1.0000 mg | INTRAMUSCULAR | Status: AC
  Administered 2023-11-09: 1 mg via INTRAMUSCULAR
  Filled 2023-11-09: qty 1

## 2023-11-09 NOTE — Progress Notes (Addendum)
 [facility] Progress Note   Subjective:    Seen and examined patient in his room. Noted he was voilet and aggressive overnight and Ativan /Haldol  were both given. He's currently sitting in his recliner and more alert/awake today. Currently oriented to himself and location. He reports having two episodes of vomiting during yesterday's HD. Noted net UF 1.5L and it appears were Bps were low at end of treatment. Next HD 5/26.  Objective Vitals:   11/09/23 0232 11/09/23 0309 11/09/23 0813 11/09/23 1131  BP: 99/65 90/65 110/73 96/66  Pulse: 100 88 88 100  Resp:  19 18 14   Temp:  98.7 F (37.1 C) 97.7 F (36.5 C) 98.3 F (36.8 C)  TempSrc:  Oral Oral Oral  SpO2: 91% 96% 97% 97%  Weight:      Height:       Physical Exam General: awake, alert, NAD, on RA Heart: RRR, no murmurs, rubs or gallops Lungs:CTA bilaterally, respirations unlabored  Abdomen: Soft, non-distended, +BS Extremities: No LE edema bilaterally Dialysis Access:  R internal jugular [facility]  Filed Weights   11/06/23 1312 11/06/23 1725 11/06/23 1727  Weight: 108.6 kg 108.2 kg 106 kg    Intake/Output Summary (Last 24 hours) at 11/09/2023 1148 Last data filed at 11/09/2023 0132 Gross per 24 hour  Intake 120 ml  Output 1500 ml  Net -1380 ml    Additional Objective Labs: Basic Metabolic Panel: Recent Labs  Lab 11/07/23 0441 11/08/23 0629 11/09/23 0703  NA 133* 136 133*  K 4.0 4.5 4.7  CL 95* 97* 98  CO2 25 22 21*  GLUCOSE 102* 64* 74  BUN 38* 53* 35*  CREATININE 12.09* 15.74* 11.72*  CALCIUM  10.3 10.2 10.0  PHOS 5.6* 7.8* 5.0*   Liver Function Tests: Recent Labs  Lab 11/07/23 0441 11/08/23 0629 11/09/23 0703  ALBUMIN  3.8 3.7 3.7   No results for input(s): "LIPASE", "AMYLASE" in the last 168 hours. CBC: Recent Labs  Lab 11/06/23 0536  WBC 6.9  HGB 13.2  HCT 39.0  MCV 90.3  PLT 72*   Blood Culture    Component Value Date/Time   SDES BLOOD RIGHT HAND 01/19/2023 0213    SPECREQUEST  01/19/2023 1610    BOTTLES DRAWN AEROBIC AND ANAEROBIC Blood Culture results may not be optimal due to an excessive volume of blood received in culture bottles   CULT  01/19/2023 0213    NO GROWTH 5 DAYS Performed at [facility], [address] [place], [place] [address]    REPTSTATUS 01/24/2023 FINAL 01/19/2023 4098    Cardiac Enzymes: No results for input(s): "CKTOTAL", "CKMB", "CKMBINDEX", "TROPONINI" in the last 168 hours. CBG: No results for input(s): "GLUCAP" in the last 168 hours. Iron  Studies: No results for input(s): "IRON ", "TIBC", "TRANSFERRIN", "FERRITIN" in the last 72 hours. Lab Results  Component Value Date   INR 2.5 (H) 04/20/2023   INR 2.6 (H) 04/19/2023   INR 2.7 (H) 04/18/2023   PROTIME 28.8 (H) 11/04/2013   PROTIME 21.6 (H) 10/14/2013   PROTIME 18.0 (H) 09/23/2013   Studies/Results: No results found.  Medications:   apixaban   5 mg Oral BID   atorvastatin   40 mg Oral Daily   calcitRIOL   2.5 mcg Oral Q M,W,F-HD   Chlorhexidine  Gluconate Cloth  6 each Topical Q0600   Chlorhexidine  Gluconate Cloth  6 each Topical Q0600   cinacalcet   150 mg Oral Q M,W,F-HD   divalproex   1,000 mg Oral Q12H   feeding supplement (NEPRO CARB  STEADY)  237 mL Oral BID BM   levETIRAcetam   500 mg Oral BID   levETIRAcetam   500 mg Oral Q M,W,F-HD   multivitamin  1 tablet Oral QHS   QUEtiapine  25 mg Oral QHS   sevelamer  carbonate  3,200 mg Oral TID WC   sodium chloride  flush  10-40 mL Intracatheter Q12H    Dialysis Orders: MWF [facility] 4h   B400   110.5kg  RIJ TDC   Heparin  none Mircera: none Calcitriol  2.5mcg PO qHD - last dose 11/01/23 Sensipar  150mg  with HD - last dose 11/01/23  Assessment/Plan: Seizure disorder - Neurology following; EEG suggestive of moderate diffuse encephalopathy, no seizures noted and MRI brain no acute changes. Had seizures on HD 5/09. Is on keppra , depakote  and vimpat  now. Per neuro/ pmd.  Acute encephalopathy - doing better, still  some deficits per neurology. Noted he was violet and agreesive overnight and Ativan /Haldol  were both given. He is doing better now. CT clear, and EEG showing moderate diffuse encephalopathy. MRI 10/27/23 showing Multiple acute infarcts in the right frontal, parietal and posterior temporal cortex as well as the adjacent subcortical white matter and punctate acute infarcts in the left basal ganglia, thalamus and overlying white matter  ESRD -  HD per MWF schedule. His amlodipine  was stopped 5/22 due to low BP. Continue to monitor his BP. Next HD 5/26. HTN/ volume  - Patient tells me he vomited twice on HD yesterday. Also noted his Bps were low after treatment. He remains euvolemic on exam and not on any anti-hypertensives. Will plan for lower UFG at next HD on 5/26. Anemia of CKD - Hgb 13.2. No Fe/ESA is indicated at this time. Secondary hyperparathyroidism - Calcium  controlled. Phos above goal but coming down with binder. Continue calcitriol , sensipar  and renvela  Nutrition - Renal diet with fluid restriction, does not seem to be eating much. Continue protein supplements. Albumin  3.8.   [name] Maxwell, NP  [facility] 11/09/2023,11:48 AM  LOS: 19 days     Seen and examined independently this AM.  Agree with note and exam as documented above by physician extender and as noted here.  Feels ok now but nausea/vomiting yesterday with HD.  Noted patient agitated overnight.  He and family member are discussing possible SNF's today.  Hypotension or relative hypotension likely contributed to nausea and feeling poorly and potentially to behavioral issues  General adult male in bed in no acute distress HEENT normocephalic atraumatic extraocular movements intact sclera anicteric Neck supple trachea midline Lungs clear to auscultation bilaterally normal work of breathing at rest on room air Heart S1S2 no rub Abdomen soft nontender nondistended Extremities no edema  Psych normal mood and  affect Neuro oriented to person and [facility]; and year; more conversant Access RIJ tunn catheter old AVF LUE no bruit   ESRD - on HD per MWF schedule. Limit UF with the next HD.  250 mL NS minibolus once now    Encephalopathy - improved but still present    HTN - I previously stopped amlodipine  due to hypotension.    Anemia of CKD - no ESA indicated    Disposition per primary team - for SNF per charting   Nan Aver, MD 11/09/2023  3:23 PM

## 2023-11-09 NOTE — Progress Notes (Addendum)
 Patient is becoming violent and aggressive.  Giving Ativan  1 mg, Haldol  1 mg, waist soft restraint and continue bedside telemetry sitter.

## 2023-11-09 NOTE — Progress Notes (Signed)
 Pt came back from dialysis. VS soft. Pt confused and insisting on getting up. Pt had a recent fall and with his soft BP, explained that it is very high rislk for him to fall again. He said he wants to sit on the chair but friend at bedside is occupying the chair and it's not available at this time. Pt getting agitated and aggressive. MD informed and new orders placed. Will continue to monitor pt.

## 2023-11-09 NOTE — Progress Notes (Signed)
 PROGRESS NOTE    [name] [name]  NWG:[id number] DOB: 02-16-1981 DOA: 10/20/2023 PCP: Collins Dean, NP   Brief Narrative:    43 y.o. male with history of ESRD on hemodialysis MWF, hypertension, prior stroke, history of seizures, lupus anticoagulant brought to the ER on 5/5 after patient had a seizure, encephalopathy. Neurology was consulted. Patient remains encephalopathic.  Had breakthrough seizure 5/9 while on HD, and moved to PCU and found to have new strokes in MRI  The patient has been seen by neurology and nephrology. EEG is suggestive of moderate diffuse encephalopathy.  Sharp transients were noted in the right frontal region. Per neurology ADDED ON ltm eeg for further evaluation. MRI on 5/6 showed no evidence of acute intracranial abnormality, and multiple remote infarcts.  MRI brain 5/11 shows multiple acute infarcts. PTOT recommending SNF/rehab He is noted to have some continued agitation requiring Ativan  and Haldol .  Plan to start Seroquel 5/24.  Assessment & Plan:   Principal Problem:   Acute encephalopathy Active Problems:   Thrombocytopenia (HCC)   Lupus anticoagulant positive   HTN (hypertension)   Seizures (HCC)   Anemia in chronic kidney disease   ESRD on hemodialysis (HCC)   History of stroke   Acute metabolic encephalopathy  Assessment and Plan:   Acute right MCA patchy infarct/ left thalamic and caudate head punctuate infarct: Likely embolic, secondary to antiphospholipid syndrome with underdosed Eliquis . Neurology following, MRI brain 5/6 no acute infarct, MRI brain 5/11 multiple acute infarcts S/p completion of stroke workup: MRA patency of major intracranial arteries. A1c 5.6.LDL: 75.  HDL: 26.  Carotid duplex no significant finding TTE:EF 70% mitral valve calcified, restricted possible rheumatic disease, mod MS mild MR Noncompliant anticoagulation at home on 2.5 ELIQUIS  BID Continue Eliquis  at 5 mg twice daily, started on Lipitor 40 Cont  aggressive stroke risk modification , PTOT W/ Plan for SNF with placement pending once behaviorally stable.   Acute encephalopathy with delirium/agitation Seizure disorder w/ breakthrough seizure on 10/25/23: Initially thought due to postictal state, but persisted. S.p MRI- most llikely that the encephalopathy is 2/2 strokes involving the frontal region. Work up [employer]: WNL.HIV: Nonreactive.B12: 539 B1 nl, RPR nonreactive. B1: Within EEG notable for BL sharps as well as right frontal subclinical seizure.Off high-dose.  Mentation overall stable alert awake following commands appropriately not agitated.  Neurology signed off, continue on increased dose of Keppra  500 twice daily and 500 MWF, Depakote  1000 mg q12 -Plan to start on Seroquel at bedtime 5/24   ESRD on HD MWF: Secondary hyperparathyroidism/MBD Anemia of CKD Nephrology following continue HD-had HD 5/20 without any issues or hypotension  Continue to monitor calcium  close and hemoglobin intermittently. Continue Renvela , calcitriol  and Sensipar    Hypertension Well-controlled . Off amlodipine  and Coreg  due to hypotension   Functional impairment Debility/deconditioning: Continue PT/OT -recommending skilled nursing facility> medically stable and awaiting for placement  Class I Obesity w/ Body mass index is 31.69 kg/m.: Will benefit with PCP follow-up, weight loss,healthy lifestyle and outpatient sleep eval if not done.     DVT prophylaxis:apixaban  Code Status: Full Family Communication: None at bedside Disposition Plan: Transfer to SNF/rehab once behaviorally stable Status is: Inpatient Remains inpatient appropriate because: Need for placement and IV medications.   Consultants:  Neurology Nephrology  Procedures:  None  Antimicrobials:  None   Subjective: Patient seen and evaluated today and is currently asleep and nonresponsive to questioning.  He was noted to violent and aggressive overnight and currently has  telesitter.  Objective:  Vitals:   11/09/23 0209 11/09/23 0215 11/09/23 0232 11/09/23 0309  BP: 91/78 (!) 99/57 99/65 90/65   Pulse: 97 96 100 88  Resp: 18 18  19   Temp:    98.7 F (37.1 C)  TempSrc:    Oral  SpO2: 95% 94% 91% 96%  Weight:      Height:        Intake/Output Summary (Last 24 hours) at 11/09/2023 0722 Last data filed at 11/09/2023 0132 Gross per 24 hour  Intake 120 ml  Output 1500 ml  Net -1380 ml   Filed Weights   11/06/23 1312 11/06/23 1725 11/06/23 1727  Weight: 108.6 kg 108.2 kg 106 kg    Examination:  General exam: Appears asleep and unresponsive Respiratory system: Clear to auscultation. Respiratory effort normal. Cardiovascular system: S1 & S2 heard, RRR.  Gastrointestinal system: Abdomen is soft Central nervous system: Asleep Extremities: No edema Skin: No significant lesions noted    Data Reviewed: I have personally reviewed following labs and imaging studies  CBC: Recent Labs  Lab 11/02/23 1113 11/06/23 0536  WBC 7.1 6.9  HGB 12.4* 13.2  HCT 36.0* 39.0  MCV 89.8 90.3  PLT 60* 72*   Basic Metabolic Panel: Recent Labs  Lab 11/02/23 1114 11/04/23 0814 11/06/23 0536 11/07/23 0441 11/08/23 0629  NA 134* 132* 131* 133* 136  K 4.7 4.6 4.3 4.0 4.5  CL 93* 90* 91* 95* 97*  CO2 23 23 22 25 22   GLUCOSE 83 77 112* 102* 64*  BUN 80* 76* 64* 38* 53*  CREATININE 20.79* 18.12* 15.96* 12.09* 15.74*  CALCIUM  9.5 10.3 10.2 10.3 10.2  PHOS 9.5* 9.5* 7.8* 5.6* 7.8*   GFR: Estimated Creatinine Clearance: 7.5 mL/min (A) (by C-G formula based on SCr of 15.74 mg/dL (H)). Liver Function Tests: Recent Labs  Lab 11/02/23 1114 11/04/23 0814 11/06/23 0536 11/07/23 0441 11/08/23 0629  ALBUMIN  3.5 3.5 3.9 3.8 3.7   No results for input(s): "LIPASE", "AMYLASE" in the last 168 hours. No results for input(s): "AMMONIA" in the last 168 hours. Coagulation Profile: No results for input(s): "INR", "PROTIME" in the last 168 hours. Cardiac  Enzymes: No results for input(s): "CKTOTAL", "CKMB", "CKMBINDEX", "TROPONINI" in the last 168 hours. BNP (last 3 results) No results for input(s): "PROBNP" in the last 8760 hours. HbA1C: No results for input(s): "HGBA1C" in the last 72 hours. CBG: No results for input(s): "GLUCAP" in the last 168 hours. Lipid Profile: No results for input(s): "CHOL", "HDL", "LDLCALC", "TRIG", "CHOLHDL", "LDLDIRECT" in the last 72 hours. Thyroid Function Tests: No results for input(s): "TSH", "T4TOTAL", "FREET4", "T3FREE", "THYROIDAB" in the last 72 hours. Anemia Panel: No results for input(s): "VITAMINB12", "FOLATE", "FERRITIN", "TIBC", "IRON ", "RETICCTPCT" in the last 72 hours. Sepsis Labs: No results for input(s): "PROCALCITON", "LATICACIDVEN" in the last 168 hours.  Recent Results (from the past 240 hours)  MRSA Next Gen by PCR, Nasal     Status: None   Collection Time: 11/01/23 10:53 AM   Specimen: Nasal Mucosa; Nasal Swab  Result Value Ref Range Status   MRSA by PCR Next Gen NOT DETECTED NOT DETECTED Final    Comment: (NOTE) The GeneXpert MRSA Assay (FDA approved for NASAL specimens only), is one component of a comprehensive MRSA colonization surveillance program. It is not intended to diagnose MRSA infection nor to guide or monitor treatment for MRSA infections. Test performance is not FDA approved in patients less than 71 years old. Performed at [facility], [address], [place],  [place] [address]          Radiology Studies: No results found.      Scheduled Meds:  apixaban   5 mg Oral BID   atorvastatin   40 mg Oral Daily   calcitRIOL   2.5 mcg Oral Q M,W,F-HD   Chlorhexidine  Gluconate Cloth  6 each Topical Q0600   Chlorhexidine  Gluconate Cloth  6 each Topical Q0600   cinacalcet   150 mg Oral Q M,W,F-HD   divalproex   1,000 mg Oral Q12H   feeding supplement (NEPRO CARB STEADY)  237 mL Oral BID BM   levETIRAcetam   500 mg Oral BID   levETIRAcetam   500 mg Oral Q  M,W,F-HD   multivitamin  1 tablet Oral QHS   sevelamer  carbonate  3,200 mg Oral TID WC   sodium chloride  flush  10-40 mL Intracatheter Q12H     LOS: 19 days    Time spent: 55 minutes    Christhoper Busbee Loran Rock, DO [facility]  If 7PM-7AM, please contact night-coverage [email or web address] 11/09/2023, 7:22 AM

## 2023-11-09 NOTE — Plan of Care (Signed)
  Problem: Education: Goal: Knowledge of General Education information will improve Description: Including pain rating scale, medication(s)/side effects and non-pharmacologic comfort measures Outcome: Progressing   Problem: Health Behavior/Discharge Planning: Goal: Ability to manage health-related needs will improve Outcome: Progressing   Problem: Clinical Measurements: Goal: Ability to maintain clinical measurements within normal limits will improve Outcome: Progressing Goal: Will remain free from infection Outcome: Progressing Goal: Diagnostic test results will improve Outcome: Progressing Goal: Respiratory complications will improve Outcome: Progressing Goal: Cardiovascular complication will be avoided Outcome: Progressing   Problem: Activity: Goal: Risk for activity intolerance will decrease Outcome: Progressing   Problem: Nutrition: Goal: Adequate nutrition will be maintained Outcome: Progressing   Problem: Coping: Goal: Level of anxiety will decrease Outcome: Progressing   Problem: Elimination: Goal: Will not experience complications related to bowel motility Outcome: Progressing Goal: Will not experience complications related to urinary retention Outcome: Progressing   Problem: Pain Managment: Goal: General experience of comfort will improve and/or be controlled Outcome: Progressing   Problem: Safety: Goal: Ability to remain free from injury will improve Outcome: Progressing   Problem: Skin Integrity: Goal: Risk for impaired skin integrity will decrease Outcome: Progressing   Problem: Education: Goal: Knowledge of disease and its progression will improve Outcome: Progressing Goal: Individualized Educational Video(s) Outcome: Progressing   Problem: Fluid Volume: Goal: Compliance with measures to maintain balanced fluid volume will improve Outcome: Progressing   Problem: Health Behavior/Discharge Planning: Goal: Ability to manage health-related needs  will improve Outcome: Progressing   Problem: Nutritional: Goal: Ability to make healthy dietary choices will improve Outcome: Progressing   Problem: Clinical Measurements: Goal: Complications related to the disease process, condition or treatment will be avoided or minimized Outcome: Progressing   Problem: Education: Goal: Knowledge of disease or condition will improve Outcome: Progressing Goal: Knowledge of secondary prevention will improve (MUST DOCUMENT ALL) Outcome: Progressing Goal: Knowledge of patient specific risk factors will improve (DELETE if not current risk factor) Outcome: Progressing   Problem: Ischemic Stroke/TIA Tissue Perfusion: Goal: Complications of ischemic stroke/TIA will be minimized Outcome: Progressing   Problem: Coping: Goal: Will verbalize positive feelings about self Outcome: Progressing Goal: Will identify appropriate support needs Outcome: Progressing   Problem: Health Behavior/Discharge Planning: Goal: Ability to manage health-related needs will improve Outcome: Progressing Goal: Goals will be collaboratively established with patient/family Outcome: Progressing   Problem: Self-Care: Goal: Ability to participate in self-care as condition permits will improve Outcome: Progressing Goal: Verbalization of feelings and concerns over difficulty with self-care will improve Outcome: Progressing Goal: Ability to communicate needs accurately will improve Outcome: Progressing   Problem: Nutrition: Goal: Risk of aspiration will decrease Outcome: Progressing Goal: Dietary intake will improve Outcome: Progressing

## 2023-11-10 DIAGNOSIS — G9340 Encephalopathy, unspecified: Secondary | ICD-10-CM | POA: Diagnosis not present

## 2023-11-10 LAB — RENAL FUNCTION PANEL
Albumin: 3.5 g/dL (ref 3.5–5.0)
Anion gap: 15 (ref 5–15)
BUN: 52 mg/dL — ABNORMAL HIGH (ref 6–20)
CO2: 22 mmol/L (ref 22–32)
Calcium: 10.2 mg/dL (ref 8.9–10.3)
Chloride: 97 mmol/L — ABNORMAL LOW (ref 98–111)
Creatinine, Ser: 15.39 mg/dL — ABNORMAL HIGH (ref 0.61–1.24)
GFR, Estimated: 4 mL/min — ABNORMAL LOW (ref >60)
Glucose, Bld: 71 mg/dL (ref 70–99)
Phosphorus: 7 mg/dL — ABNORMAL HIGH (ref 2.5–4.6)
Potassium: 4.5 mmol/L (ref 3.5–5.1)
Sodium: 134 mmol/L — ABNORMAL LOW (ref 135–145)

## 2023-11-10 LAB — CBC
HCT: 37.2 % — ABNORMAL LOW (ref 39.0–52.0)
Hemoglobin: 12.4 g/dL — ABNORMAL LOW (ref 13.0–17.0)
MCH: 31.2 pg (ref 26.0–34.0)
MCHC: 33.3 g/dL (ref 30.0–36.0)
MCV: 93.7 fL (ref 80.0–100.0)
Platelets: 99 10*3/uL — ABNORMAL LOW (ref 150–400)
RBC: 3.97 MIL/uL — ABNORMAL LOW (ref 4.22–5.81)
RDW: 13.7 % (ref 11.5–15.5)
WBC: 7.1 10*3/uL (ref 4.0–10.5)
nRBC: 0 % (ref 0.0–0.2)

## 2023-11-10 LAB — MAGNESIUM: Magnesium: 2.7 mg/dL — ABNORMAL HIGH (ref 1.7–2.4)

## 2023-11-10 NOTE — TOC Progression Note (Signed)
 Transition of Care [facility]) - Progression Note    Patient Details  Name: [name] [name] MRN: [id number] Date of Birth: May 09, 1981  Transition of Care [facility]) CM/SW Contact  Paullette Boston [place], [place] Phone Number: 11/10/2023, 9:03 AM  Clinical Narrative: Spoke to pt's friend [name] re facility choice and he has accepted [facility]. Notified Angela with [facility]. Home and Community/[employer] auth request submitted, reference [id number]. Will provide updates as available.   Paullette Boston, MSW, LCSW [phone] (coverage)      Expected Discharge Plan: Skilled Nursing Facility Barriers to Discharge: Continued Medical Work up, English as a second language teacher  Expected Discharge Plan and Services                                               Social Determinants of Health (SDOH) Interventions SDOH Screenings   Food Insecurity: No Food Insecurity (10/21/2023)  Housing: High Risk (10/21/2023)  Transportation Needs: No Transportation Needs (10/21/2023)  Utilities: Not At Risk (10/21/2023)  Alcohol Screen: Low Risk  (03/26/2023)  Depression (PHQ2-9): Low Risk  (03/26/2023)  Financial Resource Strain: Low Risk  (03/26/2023)  Physical Activity: Inactive (03/26/2023)  Social Connections: Moderately Isolated (10/21/2023)  Stress: No Stress Concern Present (03/26/2023)  Tobacco Use: High Risk (10/21/2023)  Health Literacy: Adequate Health Literacy (03/26/2023)    Readmission Risk Interventions    03/28/2023    2:57 PM 01/25/2023    3:40 PM  Readmission Risk Prevention Plan  Post Dischage Appt    Medication Screening    Transportation Screening Complete   Medication Review (RN Care Manager) Referral to Pharmacy   PCP or Specialist appointment within 3-5 days of discharge Complete   HRI or Home Care Consult Complete   SW Recovery Care/Counseling Consult Complete   Palliative Care Screening Not Applicable   Skilled Nursing Facility Not Applicable      Information is  confidential and restricted. Go to Review Flowsheets to unlock data.

## 2023-11-10 NOTE — Progress Notes (Addendum)
  [facility] Progress Note   Subjective:    Seen and examined patient at bedside. Noted NS bolus was given yesterday for low Bps. He appears sleepy today but responds to voice and answers my questions. Next HD 5/26 and plan for no UF.  Objective Vitals:   11/09/23 2047 11/10/23 0016 11/10/23 0405 11/10/23 0733  BP: 106/78 112/79 105/74 100/65  Pulse: 70 67 67 66  Resp: 12 12 12 17   Temp: 98.2 F (36.8 C) 97.8 F (36.6 C) (!) 97.4 F (36.3 C) 97.7 F (36.5 C)  TempSrc: Oral Oral Oral Oral  SpO2: 96% 96% 95% 96%  Weight:      Height:       Physical Exam General: sleepy but responds to voice, NAD, on RA Heart: RRR, no murmurs, rubs or gallops Lungs:CTA bilaterally, respirations unlabored  Abdomen: Soft, non-distended, +BS Extremities: No LE edema bilaterally Dialysis Access:  R internal jugular [facility]  Filed Weights   11/06/23 1312 11/06/23 1725 11/06/23 1727  Weight: 108.6 kg 108.2 kg 106 kg    Intake/Output Summary (Last 24 hours) at 11/10/2023 1131 Last data filed at 11/09/2023 2123 Gross per 24 hour  Intake 360 ml  Output --  Net 360 ml    Additional Objective Labs: Basic Metabolic Panel: Recent Labs  Lab 11/08/23 0629 11/09/23 0703 11/10/23 0704  NA 136 133* 134*  K 4.5 4.7 4.5  CL 97* 98 97*  CO2 22 21* 22  GLUCOSE 64* 74 71  BUN 53* 35* 52*  CREATININE 15.74* 11.72* 15.39*  CALCIUM  10.2 10.0 10.2  PHOS 7.8* 5.0* 7.0*   Liver Function Tests: Recent Labs  Lab 11/08/23 0629 11/09/23 0703 11/10/23 0704  ALBUMIN  3.7 3.7 3.5   No results for input(s): "LIPASE", "AMYLASE" in the last 168 hours. CBC: Recent Labs  Lab 11/06/23 0536 11/10/23 0704  WBC 6.9 7.1  HGB 13.2 12.4*  HCT 39.0 37.2*  MCV 90.3 93.7  PLT 72* 99*   Blood Culture    Component Value Date/Time   SDES BLOOD RIGHT HAND 01/19/2023 0213   SPECREQUEST  01/19/2023 1610    BOTTLES DRAWN AEROBIC AND ANAEROBIC Blood Culture results may not be optimal due to an  excessive volume of blood received in culture bottles   CULT  01/19/2023 0213    NO GROWTH 5 DAYS Performed at [facility], [address], [place], [place] [address]    REPTSTATUS 01/24/2023 FINAL 01/19/2023 4098    Cardiac Enzymes: No results for input(s): "CKTOTAL", "CKMB", "CKMBINDEX", "TROPONINI" in the last 168 hours. CBG: No results for input(s): "GLUCAP" in the last 168 hours. Iron  Studies: No results for input(s): "IRON ", "TIBC", "TRANSFERRIN", "FERRITIN" in the last 72 hours. Lab Results  Component Value Date   INR 2.5 (H) 04/20/2023   INR 2.6 (H) 04/19/2023   INR 2.7 (H) 04/18/2023   PROTIME 28.8 (H) 11/04/2013   PROTIME 21.6 (H) 10/14/2013   PROTIME 18.0 (H) 09/23/2013   Studies/Results: No results found.  Medications:   apixaban   5 mg Oral BID   atorvastatin   40 mg Oral Daily   calcitRIOL   2.5 mcg Oral Q M,W,F-HD   Chlorhexidine  Gluconate Cloth  6 each Topical Q0600   Chlorhexidine  Gluconate Cloth  6 each Topical Q0600   cinacalcet   150 mg Oral Q M,W,F-HD   divalproex   1,000 mg Oral Q12H   feeding supplement (NEPRO CARB STEADY)  237 mL Oral BID BM   levETIRAcetam   500 mg Oral  BID   levETIRAcetam   500 mg Oral Q M,W,F-HD   multivitamin  1 tablet Oral QHS   QUEtiapine  25 mg Oral QHS   sevelamer  carbonate  3,200 mg Oral TID WC   sodium chloride  flush  10-40 mL Intracatheter Q12H    Dialysis Orders: MWF [facility] 4h   B400   110.5kg  RIJ TDC   Heparin  none Mircera: none Calcitriol  2.5mcg PO qHD - last dose 11/01/23 Sensipar  150mg  with HD - last dose 11/01/23  Assessment/Plan: Seizure disorder - Neurology following; EEG suggestive of moderate diffuse encephalopathy, no seizures noted and MRI brain no acute changes. Had seizures on HD 5/09. Is on keppra , depakote  and vimpat  now. Per neuro/ pmd.  Acute encephalopathy - doing better, still some deficits per neurology. He was violet and agreesive on 5/24 and meds were both given. His behavior is better now. CT  clear, and EEG showing moderate diffuse encephalopathy. MRI 10/27/23 showing Multiple acute infarcts in the right frontal, parietal and posterior temporal cortex as well as the adjacent subcortical white matter and punctate acute infarcts in the left basal ganglia, thalamus and overlying white matter  ESRD -  HD per MWF schedule. His amlodipine  was stopped 5/22 due to low BP. Continue to monitor his BP. Next HD 5/26. HTN/ volume  - Patient tells me he vomited twice on HD 5/23 and 250ml NS bolus given 5/24. Plan to run even with HD scheduled for tomorrow. Anemia of CKD - Hgb 13.2. No Fe/ESA is indicated at this time. Secondary hyperparathyroidism - Calcium  controlled. Phos above goal but coming down with binder. Continue calcitriol , sensipar  and renvela  Nutrition - Renal diet with fluid restriction, does not seem to be eating much. Continue protein supplements. Albumin  3.8.   Jadene Maxwell, NP [facility] 11/10/2023,11:31 AM  LOS: 20 days     Seen and examined independently.  Agree with note and exam as documented above by physician extender and as noted here.  Friend at bedside.  Still deciding on SNF  patient denies any further nausea.  I gave NS 250 mL yesterday   Feels ok now but nausea/vomiting yesterday with HD.  Noted patient agitated overnight.  He and family member are discussing possible SNF's today.  Hypotension or relative hypotension likely contributed to nausea and feeling poorly and potentially to behavioral issues   General adult male in bed in no acute distress HEENT normocephalic atraumatic extraocular movements intact sclera anicteric Neck supple trachea midline Lungs clear to auscultation bilaterally normal work of breathing at rest on room air Heart S1S2 no rub Abdomen soft nontender nondistended Extremities no edema  Psych normal mood and affect Neuro oriented to person and [facility]; and year; sleepy but answers questions appropriately Access RIJ tunn  catheter old AVF LUE no bruit   ESRD - on HD per MWF schedule. Limit UF with the next HD.    Encephalopathy - improved but still present    HTN - I previously stopped amlodipine  due to hypotension.    Nan Aver, MD 11/10/2023  1:57 PM

## 2023-11-10 NOTE — Progress Notes (Signed)
 PROGRESS NOTE    [name] [name]  JJO:[id number] DOB: 1980/12/06 DOA: 10/20/2023 PCP: Collins Dean, NP   Brief Narrative:    43 y.o. male with history of ESRD on hemodialysis MWF, hypertension, prior stroke, history of seizures, lupus anticoagulant brought to the ER on 5/5 after patient had a seizure, encephalopathy. Neurology was consulted. Patient remains encephalopathic.  Had breakthrough seizure 5/9 while on HD, and moved to PCU and found to have new strokes in MRI  The patient has been seen by neurology and nephrology. EEG is suggestive of moderate diffuse encephalopathy.  Sharp transients were noted in the right frontal region. Per neurology ADDED ON ltm eeg for further evaluation. MRI on 5/6 showed no evidence of acute intracranial abnormality, and multiple remote infarcts.  MRI brain 5/11 shows multiple acute infarcts. PTOT recommending SNF/rehab He is noted to have some continued agitation requiring Ativan  and Haldol .  Plan to start Seroquel 5/24 which has been helping.  Nephrology planning for further HD 5/26.  Assessment & Plan:   Principal Problem:   Acute encephalopathy Active Problems:   Thrombocytopenia (HCC)   Lupus anticoagulant positive   HTN (hypertension)   Seizures (HCC)   Anemia in chronic kidney disease   ESRD on hemodialysis (HCC)   History of stroke   Acute metabolic encephalopathy  Assessment and Plan:   Acute right MCA patchy infarct/ left thalamic and caudate head punctuate infarct: Likely embolic, secondary to antiphospholipid syndrome with underdosed Eliquis . Neurology following, MRI brain 5/6 no acute infarct, MRI brain 5/11 multiple acute infarcts S/p completion of stroke workup: MRA patency of major intracranial arteries. A1c 5.6.LDL: 75.  HDL: 26.  Carotid duplex no significant finding TTE:EF 70% mitral valve calcified, restricted possible rheumatic disease, mod MS mild MR Noncompliant anticoagulation at home on 2.5 ELIQUIS  BID Continue  Eliquis  at 5 mg twice daily, started on Lipitor 40 Cont aggressive stroke risk modification , PTOT W/ Plan for SNF with placement pending once behaviorally stable.   Acute encephalopathy with delirium/agitation Seizure disorder w/ breakthrough seizure on 10/25/23: Initially thought due to postictal state, but persisted. S.p MRI- most llikely that the encephalopathy is 2/2 strokes involving the frontal region. Work up [employer]: WNL.HIV: Nonreactive.B12: 539 B1 nl, RPR nonreactive. B1: Within EEG notable for BL sharps as well as right frontal subclinical seizure.Off high-dose.  Mentation overall stable alert awake following commands appropriately not agitated.  Neurology signed off, continue on increased dose of Keppra  500 twice daily and 500 MWF, Depakote  1000 mg q12 -Plan to start on Seroquel at bedtime 5/24 which has been helping with behavioral disturbances   ESRD on HD MWF: Secondary hyperparathyroidism/MBD Anemia of CKD Nephrology following continue HD-with next session 5/26 Continue to monitor calcium  close and hemoglobin intermittently. Continue Renvela , calcitriol  and Sensipar    Hypertension Well-controlled . Off amlodipine  and Coreg  due to hypotension   Functional impairment Debility/deconditioning: Continue PT/OT -recommending skilled nursing facility> medically stable and awaiting for placement  Class I Obesity w/ Body mass index is 31.69 kg/m.: Will benefit with PCP follow-up, weight loss,healthy lifestyle and outpatient sleep eval if not done.     DVT prophylaxis:apixaban  Code Status: Full Family Communication: Friend at bedside 5/25 Disposition Plan: Transfer to SNF/rehab once authorization received Status is: Inpatient Remains inpatient appropriate because: Need for placement and IV medications.   Consultants:  Neurology Nephrology  Procedures:  None  Antimicrobials:  None   Subjective: Patient seen and evaluated today and is currently asleep, but  arousable.  He overall  appears to be in a foul mood, but will answer questions with friend at bedside.  No acute overnight events noted.  Objective: Vitals:   11/09/23 2047 11/10/23 0016 11/10/23 0405 11/10/23 0733  BP: 106/78 112/79 105/74 100/65  Pulse: 70 67 67 66  Resp: 12 12 12 17   Temp: 98.2 F (36.8 C) 97.8 F (36.6 C) (!) 97.4 F (36.3 C) 97.7 F (36.5 C)  TempSrc: Oral Oral Oral Oral  SpO2: 96% 96% 95% 96%  Weight:      Height:        Intake/Output Summary (Last 24 hours) at 11/10/2023 0935 Last data filed at 11/09/2023 2123 Gross per 24 hour  Intake 360 ml  Output --  Net 360 ml   Filed Weights   11/06/23 1312 11/06/23 1725 11/06/23 1727  Weight: 108.6 kg 108.2 kg 106 kg    Examination:  General exam: Appears calm Respiratory system: Clear to auscultation. Respiratory effort normal. Cardiovascular system: S1 & S2 heard, RRR.  Gastrointestinal system: Abdomen is soft Central nervous system: Asleep, but arousable Extremities: No edema Skin: No significant lesions noted    Data Reviewed: I have personally reviewed following labs and imaging studies  CBC: Recent Labs  Lab 11/06/23 0536 11/10/23 0704  WBC 6.9 7.1  HGB 13.2 12.4*  HCT 39.0 37.2*  MCV 90.3 93.7  PLT 72* 99*   Basic Metabolic Panel: Recent Labs  Lab 11/06/23 0536 11/07/23 0441 11/08/23 0629 11/09/23 0703 11/10/23 0704  NA 131* 133* 136 133* 134*  K 4.3 4.0 4.5 4.7 4.5  CL 91* 95* 97* 98 97*  CO2 22 25 22  21* 22  GLUCOSE 112* 102* 64* 74 71  BUN 64* 38* 53* 35* 52*  CREATININE 15.96* 12.09* 15.74* 11.72* 15.39*  CALCIUM  10.2 10.3 10.2 10.0 10.2  MG  --   --   --   --  2.7*  PHOS 7.8* 5.6* 7.8* 5.0* 7.0*   GFR: Estimated Creatinine Clearance: 7.7 mL/min (A) (by C-G formula based on SCr of 15.39 mg/dL (H)). Liver Function Tests: Recent Labs  Lab 11/06/23 0536 11/07/23 0441 11/08/23 0629 11/09/23 0703 11/10/23 0704  ALBUMIN  3.9 3.8 3.7 3.7 3.5   No results for  input(s): "LIPASE", "AMYLASE" in the last 168 hours. No results for input(s): "AMMONIA" in the last 168 hours. Coagulation Profile: No results for input(s): "INR", "PROTIME" in the last 168 hours. Cardiac Enzymes: No results for input(s): "CKTOTAL", "CKMB", "CKMBINDEX", "TROPONINI" in the last 168 hours. BNP (last 3 results) No results for input(s): "PROBNP" in the last 8760 hours. HbA1C: No results for input(s): "HGBA1C" in the last 72 hours. CBG: No results for input(s): "GLUCAP" in the last 168 hours. Lipid Profile: No results for input(s): "CHOL", "HDL", "LDLCALC", "TRIG", "CHOLHDL", "LDLDIRECT" in the last 72 hours. Thyroid Function Tests: No results for input(s): "TSH", "T4TOTAL", "FREET4", "T3FREE", "THYROIDAB" in the last 72 hours. Anemia Panel: No results for input(s): "VITAMINB12", "FOLATE", "FERRITIN", "TIBC", "IRON ", "RETICCTPCT" in the last 72 hours. Sepsis Labs: No results for input(s): "PROCALCITON", "LATICACIDVEN" in the last 168 hours.  Recent Results (from the past 240 hours)  MRSA Next Gen by PCR, Nasal     Status: None   Collection Time: 11/01/23 10:53 AM   Specimen: Nasal Mucosa; Nasal Swab  Result Value Ref Range Status   MRSA by PCR Next Gen NOT DETECTED NOT DETECTED Final    Comment: (NOTE) The GeneXpert MRSA Assay (FDA approved for NASAL specimens only), is one component of a  comprehensive MRSA colonization surveillance program. It is not intended to diagnose MRSA infection nor to guide or monitor treatment for MRSA infections. Test performance is not FDA approved in patients less than 55 years old. Performed at [facility], [address], [place], [place] [address]          Radiology Studies: No results found.      Scheduled Meds:  apixaban   5 mg Oral BID   atorvastatin   40 mg Oral Daily   calcitRIOL   2.5 mcg Oral Q M,W,F-HD   Chlorhexidine  Gluconate Cloth  6 each Topical Q0600   Chlorhexidine  Gluconate Cloth  6 each Topical  Q0600   cinacalcet   150 mg Oral Q M,W,F-HD   divalproex   1,000 mg Oral Q12H   feeding supplement (NEPRO CARB STEADY)  237 mL Oral BID BM   levETIRAcetam   500 mg Oral BID   levETIRAcetam   500 mg Oral Q M,W,F-HD   multivitamin  1 tablet Oral QHS   QUEtiapine  25 mg Oral QHS   sevelamer  carbonate  3,200 mg Oral TID WC   sodium chloride  flush  10-40 mL Intracatheter Q12H     LOS: 20 days    Time spent: 55 minutes    [name] Grandt Loran Rock, DO [facility]  If 7PM-7AM, please contact night-coverage [email or web address] 11/10/2023, 9:35 AM

## 2023-11-10 NOTE — Plan of Care (Signed)
  Problem: Education: Goal: Knowledge of General Education information will improve Description: Including pain rating scale, medication(s)/side effects and non-pharmacologic comfort measures Outcome: Progressing   Problem: Clinical Measurements: Goal: Ability to maintain clinical measurements within normal limits will improve Outcome: Progressing   Problem: Safety: Goal: Ability to remain free from injury will improve Outcome: Progressing   Problem: Skin Integrity: Goal: Risk for impaired skin integrity will decrease Outcome: Progressing   Problem: Education: Goal: Knowledge of disease or condition will improve Outcome: Progressing   Problem: Ischemic Stroke/TIA Tissue Perfusion: Goal: Complications of ischemic stroke/TIA will be minimized Outcome: Progressing   Problem: Health Behavior/Discharge Planning: Goal: Ability to manage health-related needs will improve Outcome: Progressing   Problem: Self-Care: Goal: Ability to participate in self-care as condition permits will improve Outcome: Progressing   Problem: Nutrition: Goal: Risk of aspiration will decrease Outcome: Progressing

## 2023-11-10 NOTE — Plan of Care (Signed)
  Problem: Education: Goal: Knowledge of General Education information will improve Description: Including pain rating scale, medication(s)/side effects and non-pharmacologic comfort measures Outcome: Progressing   Problem: Health Behavior/Discharge Planning: Goal: Ability to manage health-related needs will improve Outcome: Progressing   Problem: Clinical Measurements: Goal: Ability to maintain clinical measurements within normal limits will improve Outcome: Progressing Goal: Will remain free from infection Outcome: Progressing Goal: Diagnostic test results will improve Outcome: Progressing Goal: Respiratory complications will improve Outcome: Progressing Goal: Cardiovascular complication will be avoided Outcome: Progressing   Problem: Activity: Goal: Risk for activity intolerance will decrease Outcome: Progressing   Problem: Nutrition: Goal: Adequate nutrition will be maintained Outcome: Progressing   Problem: Coping: Goal: Level of anxiety will decrease Outcome: Progressing   Problem: Elimination: Goal: Will not experience complications related to bowel motility Outcome: Progressing Goal: Will not experience complications related to urinary retention Outcome: Progressing   Problem: Pain Managment: Goal: General experience of comfort will improve and/or be controlled Outcome: Progressing   Problem: Safety: Goal: Ability to remain free from injury will improve Outcome: Progressing   Problem: Skin Integrity: Goal: Risk for impaired skin integrity will decrease Outcome: Progressing   Problem: Education: Goal: Knowledge of disease and its progression will improve Outcome: Progressing Goal: Individualized Educational Video(s) Outcome: Progressing   Problem: Fluid Volume: Goal: Compliance with measures to maintain balanced fluid volume will improve Outcome: Progressing   Problem: Health Behavior/Discharge Planning: Goal: Ability to manage health-related needs  will improve Outcome: Progressing   Problem: Nutritional: Goal: Ability to make healthy dietary choices will improve Outcome: Progressing   Problem: Clinical Measurements: Goal: Complications related to the disease process, condition or treatment will be avoided or minimized Outcome: Progressing   Problem: Education: Goal: Knowledge of disease or condition will improve Outcome: Progressing Goal: Knowledge of secondary prevention will improve (MUST DOCUMENT ALL) Outcome: Progressing Goal: Knowledge of patient specific risk factors will improve (DELETE if not current risk factor) Outcome: Progressing   Problem: Ischemic Stroke/TIA Tissue Perfusion: Goal: Complications of ischemic stroke/TIA will be minimized Outcome: Progressing   Problem: Coping: Goal: Will verbalize positive feelings about self Outcome: Progressing Goal: Will identify appropriate support needs Outcome: Progressing   Problem: Health Behavior/Discharge Planning: Goal: Ability to manage health-related needs will improve Outcome: Progressing Goal: Goals will be collaboratively established with patient/family Outcome: Progressing   Problem: Self-Care: Goal: Ability to participate in self-care as condition permits will improve Outcome: Progressing Goal: Verbalization of feelings and concerns over difficulty with self-care will improve Outcome: Progressing Goal: Ability to communicate needs accurately will improve Outcome: Progressing   Problem: Nutrition: Goal: Risk of aspiration will decrease Outcome: Progressing Goal: Dietary intake will improve Outcome: Progressing

## 2023-11-11 DIAGNOSIS — G9340 Encephalopathy, unspecified: Secondary | ICD-10-CM | POA: Diagnosis not present

## 2023-11-11 LAB — CBC
HCT: 35.5 % — ABNORMAL LOW (ref 39.0–52.0)
Hemoglobin: 11.9 g/dL — ABNORMAL LOW (ref 13.0–17.0)
MCH: 30.6 pg (ref 26.0–34.0)
MCHC: 33.5 g/dL (ref 30.0–36.0)
MCV: 91.3 fL (ref 80.0–100.0)
Platelets: 119 10*3/uL — ABNORMAL LOW (ref 150–400)
RBC: 3.89 MIL/uL — ABNORMAL LOW (ref 4.22–5.81)
RDW: 13.9 % (ref 11.5–15.5)
WBC: 7 10*3/uL (ref 4.0–10.5)
nRBC: 0 % (ref 0.0–0.2)

## 2023-11-11 LAB — RENAL FUNCTION PANEL
Albumin: 3.7 g/dL (ref 3.5–5.0)
Anion gap: 17 — ABNORMAL HIGH (ref 5–15)
BUN: 73 mg/dL — ABNORMAL HIGH (ref 6–20)
CO2: 23 mmol/L (ref 22–32)
Calcium: 10.5 mg/dL — ABNORMAL HIGH (ref 8.9–10.3)
Chloride: 95 mmol/L — ABNORMAL LOW (ref 98–111)
Creatinine, Ser: 17.51 mg/dL — ABNORMAL HIGH (ref 0.61–1.24)
GFR, Estimated: 3 mL/min — ABNORMAL LOW (ref >60)
Glucose, Bld: 74 mg/dL (ref 70–99)
Phosphorus: 7.9 mg/dL — ABNORMAL HIGH (ref 2.5–4.6)
Potassium: 4.5 mmol/L (ref 3.5–5.1)
Sodium: 135 mmol/L (ref 135–145)

## 2023-11-11 MED ORDER — HEPARIN SODIUM (PORCINE) 1000 UNIT/ML IJ SOLN
INTRAMUSCULAR | Status: AC
  Filled 2023-11-11: qty 3

## 2023-11-11 NOTE — Progress Notes (Signed)
 OT Cancellation Note  Patient Details Name: [name] [name] MRN: [id number] DOB: 25-Dec-1980   Cancelled Treatment:    Reason Eval/Treat Not Completed: Patient at procedure or test/ unavailable (Patient off unit in HD, OT to reattempt as schedule permits)  Jovita Nipper 11/11/2023, 11:03 AM  Anitra Barn, OTA [facility]  Office [phone]

## 2023-11-11 NOTE — Progress Notes (Signed)
 Patient returned to 3W09 from HD, VSS. Lunch ordered. No other distress or concerns voiced. Family at bedside.

## 2023-11-11 NOTE — Progress Notes (Signed)
   11/11/23 1215  Vitals  Temp 98 F (36.7 C)  Pulse Rate 71  Resp 14  BP 118/77  SpO2 99 %  O2 Device Room Air  Weight 107.6 kg  Type of Weight Post-Dialysis  Post Treatment  Dialyzer Clearance Lightly streaked  Hemodialysis Intake (mL) 0 mL  Liters Processed 72  Fluid Removed (mL) 0 mL  Tolerated HD Treatment Yes   Received patient in bed to unit.  Alert and oriented.  Informed consent signed and in chart.   TX duration: 3 hours  Patient tolerated well.  Transported back to the room  Alert, without acute distress.  Hand-off given to patient's nurse.   Access used: Right HD Chest Catheter Access issues: None

## 2023-11-11 NOTE — Plan of Care (Signed)
  Problem: Health Behavior/Discharge Planning: Goal: Ability to manage health-related needs will improve Outcome: Not Progressing  Continue with TELE sitter for safety.

## 2023-11-11 NOTE — Progress Notes (Signed)
  [facility] Progress Note  Subjective:  Seen in HD unit  No c/o's, more verbal , improving  Vitals:   11/11/23 0930 11/11/23 1000 11/11/23 1030 11/11/23 1100  BP: (!) 131/103 121/81 97/76 (!) 117/92  Pulse: 67 72 70 (!) 142  Resp: 12 10 10 20   Temp:      TempSrc:      SpO2: 100% 100% 100% 95%  Weight:      Height:        Exam: General: on RA, alert and responsive verbally  Heart: RRR, no murmurs, rubs or gallops Lungs:CTA bilaterally, respirations unlabored  Abdomen: Soft, non-distended, +BS Extremities: No LE edema bilaterally Dialysis Access:  R internal jugular TDC  Renal-related home meds: Norvasc  10 every day Auryxia  840mg  ac tid Coreg  6.25 bid  Renavite every day Renvela  3200mg  ac tid Others: eliquis , keppra , statin   OP HD: MWF [facility]  4h   B400  110.5kg   RIJ TDC   Heparin  none Mircera: none Rocaltrol  2.5 mcg three times per week Sensipar  150 mg three times per week    Assessment/ Plan: Seizure disorder: EEG was suggestive of moderate diffuse encephalopathy, no seizures noted and MRI brain no acute changes. Is on keppra  and depakote  now. Neuro has signed off. Per pmd.  Acute encephalopathy: doing better, still some deficits per neurology. He was violet and agressive on 5/24. Behavior is better now. EEG showed moderate diffuse encephalopathy. MRI 5/11 showed multiple acute infarcts R > L, per neuro felt to be due to antiphospholipid syndrome with underdosed Eliquis . Eliquis  ^'d to 5 mg bid.  ESRD: HD per MWF schedule. HD today. HTN: bp's very high on admission, since then BP's have been low to normal and not requiring home coreg / norvasc .  Volume: keeping even w/ HD session today. Pt vomited twice on HD 5/23.  Anemia of CKD - Hgb 10-13 now. No Fe/ESA is indicated at this time. Secondary hyperparathyroidism - Calcium  controlled. Phos above goal 7-8 range. Continue calcitriol , sensipar  and renvela      Larry Poag MD  [facility] 11/11/2023, 12:32  PM  Recent Labs  Lab 11/06/23 0536 11/07/23 0441 11/10/23 0704 11/11/23 0630  HGB 13.2  --  12.4*  --   ALBUMIN  3.9   < > 3.5 3.7  CALCIUM  10.2   < > 10.2 10.5*  PHOS 7.8*   < > 7.0* 7.9*  CREATININE 15.96*   < > 15.39* 17.51*  K 4.3   < > 4.5 4.5   < > = values in this interval not displayed.   No results for input(s): "IRON ", "TIBC", "FERRITIN" in the last 168 hours. Inpatient medications:  apixaban   5 mg Oral BID   atorvastatin   40 mg Oral Daily   calcitRIOL   2.5 mcg Oral Q M,W,F-HD   Chlorhexidine  Gluconate Cloth  6 each Topical Q0600   Chlorhexidine  Gluconate Cloth  6 each Topical Q0600   cinacalcet   150 mg Oral Q M,W,F-HD   divalproex   1,000 mg Oral Q12H   feeding supplement (NEPRO CARB STEADY)  237 mL Oral BID BM   levETIRAcetam   500 mg Oral BID   levETIRAcetam   500 mg Oral Q M,W,F-HD   multivitamin  1 tablet Oral QHS   QUEtiapine  25 mg Oral QHS   sevelamer  carbonate  3,200 mg Oral TID WC   sodium chloride  flush  10-40 mL Intracatheter Q12H    haloperidol  lactate, hydrALAZINE , LORazepam , ondansetron  (ZOFRAN ) IV, sodium chloride  flush, trimethobenzamide 

## 2023-11-11 NOTE — Progress Notes (Signed)
 Patient left unit [phone] for HD at this time.

## 2023-11-11 NOTE — Progress Notes (Signed)
 Occupational Therapy Treatment Patient Details Name: [name] [name] MRN: [id number] DOB: 09/14/1980 Today's Date: 11/11/2023   History of present illness Patient is a 43 year old male with witnessed seizure at dialysis on 10/21/2023. Patient leaving AMA, and returning a few hours later via [facility]. CT clear, and EEG showing moderate diffuse encephalopathy. MRI 10/27/23 showing Multiple acute infarcts in the right frontal, parietal and posterior temporal cortex as well as the adjacent subcortical white matter and punctate acute infarcts in the left basal ganglia, thalamus and overlying white matter. PMH: ESRD on HD, antiphospholipid syndrome, HTN, seizure, stroke.   OT comments  Patient received in supine and agreeable to OT treatment. Patient able to get to EOB with supervision and ambulated to sink with CGA. Patient performed grooming standing at sink with min assist and cues to locate items on left. Patient asked to sit following grooming and performed UB bathing and gown change in sitting. Patient asked to returned to bed due to increased fatigue from having HD earlier today. Patient able to return to supine with supervision. Patient will benefit from continued inpatient follow up therapy, <3 hours/day. Acute OT to continue to follow to address established goals to facilitate DC to next venue of care.        If plan is discharge home, recommend the following:  Assistance with cooking/housework;Direct supervision/assist for medications management;Direct supervision/assist for financial management;Assist for transportation;Help with stairs or ramp for entrance;A little help with walking and/or transfers;A lot of help with bathing/dressing/bathroom   Equipment Recommendations  Other (comment) (defer)    Recommendations for Other Services      Precautions / Restrictions Precautions Precautions: Fall Recall of Precautions/Restrictions: Intact Restrictions Weight Bearing Restrictions Per Provider  Order: No       Mobility Bed Mobility Overal bed mobility: Needs Assistance Bed Mobility: Supine to Sit, Sit to Supine     Supine to sit: Supervision, HOB elevated, Used rails Sit to supine: Supervision, Used rails   General bed mobility comments: increased time and cues to initiate    Transfers Overall transfer level: Needs assistance Equipment used: Rolling walker (2 wheels) Transfers: Sit to/from Stand, Bed to chair/wheelchair/BSC Sit to Stand: Contact guard assist           General transfer comment: cues for safety and to stay in walker with mobility     Balance Overall balance assessment: Needs assistance Sitting-balance support: Feet supported Sitting balance-Leahy Scale: Fair Sitting balance - Comments: supervision EOB   Standing balance support: Single extremity supported, Bilateral upper extremity supported, During functional activity Standing balance-Leahy Scale: Poor Standing balance comment: reliant on RW and/or sink for support                           ADL either performed or assessed with clinical judgement   ADL Overall ADL's : Needs assistance/impaired     Grooming: Wash/dry hands;Wash/dry face;Oral care;Minimal assistance;Standing;Cueing for sequencing Grooming Details (indicate cue type and reason): cues for sequencing and assistance needed with toothpaste and cues to locate items on left Upper Body Bathing: Supervision/ safety;Cueing for sequencing;Sitting Upper Body Bathing Details (indicate cue type and reason): patient asked to sit following grooming     Upper Body Dressing : Minimal assistance;Sitting Upper Body Dressing Details (indicate cue type and reason): to change gown                   General ADL Comments: patient became more fatigue during self  care tasks    Extremity/Trunk Assessment              Vision   Vision Assessment?: Vision impaired- to be further tested in functional context Additional  Comments: cues for scanning to left   Perception     Praxis     Communication Communication Communication: Impaired Factors Affecting Communication: Reduced clarity of speech;Difficulty expressing self (soft spoken)   Cognition Arousal: Alert Behavior During Therapy: Flat affect Cognition: Cognition impaired   Orientation impairments: Time, Situation Awareness: Intellectual awareness impaired, Online awareness impaired Memory impairment (select all impairments): Short-term memory, Working memory Attention impairment (select first level of impairment): Focused attention Executive functioning impairment (select all impairments): Initiation, Organization, Sequencing, Reasoning, Problem solving OT - Cognition Comments: alert initially but became more lethargic by end of session.                 Following commands: Impaired Following commands impaired: Follows one step commands inconsistently, Follows one step commands with increased time      Cueing   Cueing Techniques: Verbal cues, Tactile cues, Visual cues, Gestural cues  Exercises      Shoulder Instructions       General Comments VSS    Pertinent Vitals/ Pain       Pain Assessment Pain Assessment: No/denies pain Pain Intervention(s): Monitored during session  Home Living                                          Prior Functioning/Environment              Frequency  Min 2X/week        Progress Toward Goals  OT Goals(current goals can now be found in the care plan section)  Progress towards OT goals: Progressing toward goals  Acute Rehab OT Goals Patient Stated Goal: go back to sleep OT Goal Formulation: With patient Time For Goal Achievement: 11/12/23 Potential to Achieve Goals: Fair ADL Goals Pt Will Perform Eating: with set-up;with supervision;sitting Pt Will Perform Grooming: with set-up;with supervision;sitting Pt Will Perform Upper Body Bathing: with min  assist;sitting Additional ADL Goal #1: Pt will demosntrate selective attention in nondistracting environment to increase independence wiht ALD tasks.  Plan      Co-evaluation                 AM-PAC OT "6 Clicks" Daily Activity     Outcome Measure   Help from another person eating meals?: A Little Help from another person taking care of personal grooming?: A Little Help from another person toileting, which includes using toliet, bedpan, or urinal?: A Little Help from another person bathing (including washing, rinsing, drying)?: A Lot Help from another person to put on and taking off regular upper body clothing?: A Little Help from another person to put on and taking off regular lower body clothing?: A Lot 6 Click Score: 16    End of Session Equipment Utilized During Treatment: Gait belt;Rolling walker (2 wheels)  OT Visit Diagnosis: Unsteadiness on feet (R26.81);Other abnormalities of gait and mobility (R26.89);Muscle weakness (generalized) (M62.81);Other symptoms and signs involving the nervous system (R29.898);Other symptoms and signs involving cognitive function   Activity Tolerance Patient tolerated treatment well;Patient limited by fatigue   Patient Left in bed;with call bell/phone within reach;with bed alarm set   Nurse Communication Mobility status        Time: [phone]  OT Time Calculation (min): 17 min  Charges: OT General Charges $OT Visit: 1 Visit OT Treatments $Self Care/Home Management : 8-22 mins  Anitra Barn, OTA Acute Rehabilitation Services  Office [phone]  Jovita Nipper 11/11/2023, 3:02 PM

## 2023-11-11 NOTE — Plan of Care (Signed)
 Problem: Education: Goal: Knowledge of General Education information will improve Description: Including pain rating scale, medication(s)/side effects and non-pharmacologic comfort measures 11/11/2023 0544 by Les Rao, RN Outcome: Progressing 11/10/2023 2034 by Les Rao, RN Outcome: Progressing   Problem: Health Behavior/Discharge Planning: Goal: Ability to manage health-related needs will improve 11/11/2023 0544 by Les Rao, RN Outcome: Progressing 11/10/2023 2034 by Les Rao, RN Outcome: Progressing   Problem: Clinical Measurements: Goal: Ability to maintain clinical measurements within normal limits will improve 11/11/2023 0544 by Les Rao, RN Outcome: Progressing 11/10/2023 2034 by Les Rao, RN Outcome: Progressing Goal: Will remain free from infection 11/11/2023 0544 by Les Rao, RN Outcome: Progressing 11/10/2023 2034 by Les Rao, RN Outcome: Progressing Goal: Diagnostic test results will improve 11/11/2023 0544 by Les Rao, RN Outcome: Progressing 11/10/2023 2034 by Les Rao, RN Outcome: Progressing Goal: Respiratory complications will improve 11/11/2023 0544 by Les Rao, RN Outcome: Progressing 11/10/2023 2034 by Les Rao, RN Outcome: Progressing Goal: Cardiovascular complication will be avoided 11/11/2023 0544 by Les Rao, RN Outcome: Progressing 11/10/2023 2034 by Les Rao, RN Outcome: Progressing   Problem: Activity: Goal: Risk for activity intolerance will decrease 11/11/2023 0544 by Les Rao, RN Outcome: Progressing 11/10/2023 2034 by Les Rao, RN Outcome: Progressing   Problem: Nutrition: Goal: Adequate nutrition will be maintained 11/11/2023 0544 by Les Rao, RN Outcome: Progressing 11/10/2023 2034 by Les Rao, RN Outcome: Progressing   Problem: Coping: Goal: Level of anxiety will decrease 11/11/2023  0544 by Les Rao, RN Outcome: Progressing 11/10/2023 2034 by Les Rao, RN Outcome: Progressing   Problem: Elimination: Goal: Will not experience complications related to bowel motility 11/11/2023 0544 by Les Rao, RN Outcome: Progressing 11/10/2023 2034 by Les Rao, RN Outcome: Progressing Goal: Will not experience complications related to urinary retention 11/11/2023 0544 by Les Rao, RN Outcome: Progressing 11/10/2023 2034 by Les Rao, RN Outcome: Progressing   Problem: Pain Managment: Goal: General experience of comfort will improve and/or be controlled 11/11/2023 0544 by Les Rao, RN Outcome: Progressing 11/10/2023 2034 by Les Rao, RN Outcome: Progressing   Problem: Safety: Goal: Ability to remain free from injury will improve 11/11/2023 0544 by Les Rao, RN Outcome: Progressing 11/10/2023 2034 by Les Rao, RN Outcome: Progressing   Problem: Skin Integrity: Goal: Risk for impaired skin integrity will decrease 11/11/2023 0544 by Les Rao, RN Outcome: Progressing 11/10/2023 2034 by Les Rao, RN Outcome: Progressing   Problem: Education: Goal: Knowledge of disease and its progression will improve 11/11/2023 0544 by Les Rao, RN Outcome: Progressing 11/10/2023 2034 by Les Rao, RN Outcome: Progressing Goal: Individualized Educational Video(s) 11/11/2023 0544 by Les Rao, RN Outcome: Progressing 11/10/2023 2034 by Les Rao, RN Outcome: Progressing   Problem: Fluid Volume: Goal: Compliance with measures to maintain balanced fluid volume will improve 11/11/2023 0544 by Les Rao, RN Outcome: Progressing 11/10/2023 2034 by Les Rao, RN Outcome: Progressing   Problem: Health Behavior/Discharge Planning: Goal: Ability to manage health-related needs will improve 11/11/2023 0544 by Les Rao, RN Outcome:  Progressing 11/10/2023 2034 by Les Rao, RN Outcome: Progressing   Problem: Nutritional: Goal: Ability to make healthy dietary choices will improve 11/11/2023 0544 by Les Rao, RN Outcome: Progressing 11/10/2023 2034 by Les Rao, RN Outcome: Progressing   Problem: Clinical Measurements: Goal: Complications related to the  disease process, condition or treatment will be avoided or minimized 11/11/2023 0544 by Les Rao, RN Outcome: Progressing 11/10/2023 2034 by Les Rao, RN Outcome: Progressing   Problem: Education: Goal: Knowledge of disease or condition will improve 11/11/2023 0544 by Les Rao, RN Outcome: Progressing 11/10/2023 2034 by Les Rao, RN Outcome: Progressing Goal: Knowledge of secondary prevention will improve (MUST DOCUMENT ALL) 11/11/2023 0544 by Les Rao, RN Outcome: Progressing 11/10/2023 2034 by Les Rao, RN Outcome: Progressing Goal: Knowledge of patient specific risk factors will improve (DELETE if not current risk factor) 11/11/2023 0544 by Les Rao, RN Outcome: Progressing 11/10/2023 2034 by Les Rao, RN Outcome: Progressing   Problem: Ischemic Stroke/TIA Tissue Perfusion: Goal: Complications of ischemic stroke/TIA will be minimized 11/11/2023 0544 by Les Rao, RN Outcome: Progressing 11/10/2023 2034 by Les Rao, RN Outcome: Progressing   Problem: Coping: Goal: Will verbalize positive feelings about self 11/11/2023 0544 by Les Rao, RN Outcome: Progressing 11/10/2023 2034 by Les Rao, RN Outcome: Progressing Goal: Will identify appropriate support needs 11/11/2023 0544 by Les Rao, RN Outcome: Progressing 11/10/2023 2034 by Les Rao, RN Outcome: Progressing   Problem: Health Behavior/Discharge Planning: Goal: Ability to manage health-related needs will improve 11/11/2023 0544 by Les Rao,  RN Outcome: Progressing 11/10/2023 2034 by Les Rao, RN Outcome: Progressing Goal: Goals will be collaboratively established with patient/family 11/11/2023 0544 by Les Rao, RN Outcome: Progressing 11/10/2023 2034 by Les Rao, RN Outcome: Progressing   Problem: Self-Care: Goal: Ability to participate in self-care as condition permits will improve 11/11/2023 0544 by Les Rao, RN Outcome: Progressing 11/10/2023 2034 by Les Rao, RN Outcome: Progressing Goal: Verbalization of feelings and concerns over difficulty with self-care will improve 11/11/2023 0544 by Les Rao, RN Outcome: Progressing 11/10/2023 2034 by Les Rao, RN Outcome: Progressing Goal: Ability to communicate needs accurately will improve 11/11/2023 0544 by Les Rao, RN Outcome: Progressing 11/10/2023 2034 by Les Rao, RN Outcome: Progressing   Problem: Nutrition: Goal: Risk of aspiration will decrease 11/11/2023 0544 by Les Rao, RN Outcome: Progressing 11/10/2023 2034 by Les Rao, RN Outcome: Progressing Goal: Dietary intake will improve 11/11/2023 0544 by Les Rao, RN Outcome: Progressing 11/10/2023 2034 by Les Rao, RN Outcome: Progressing

## 2023-11-11 NOTE — Progress Notes (Signed)
 PROGRESS NOTE    [name] [name]  UJW:[id number] DOB: [birth year] DOA: 10/20/2023 PCP: Collins Dean, NP   Brief Narrative:    43 y.o. male with history of ESRD on hemodialysis MWF, hypertension, prior stroke, history of seizures, lupus anticoagulant brought to the ER on 5/5 after patient had a seizure, encephalopathy. Neurology was consulted. Patient remains encephalopathic.  Had breakthrough seizure 5/9 while on HD, and moved to PCU and found to have new strokes in MRI  The patient has been seen by neurology and nephrology. EEG is suggestive of moderate diffuse encephalopathy.  Sharp transients were noted in the right frontal region. Per neurology ADDED ON ltm eeg for further evaluation. MRI on 5/6 showed no evidence of acute intracranial abnormality, and multiple remote infarcts.  MRI brain 5/11 shows multiple acute infarcts. PTOT recommending SNF/rehab He is noted to have some continued agitation requiring Ativan  and Haldol .  Plan to start Seroquel 5/24 which has been helping.  Patient is undergoing further hemodialysis today.  Awaiting further SNF placement per TOC.  Assessment & Plan:   Principal Problem:   Acute encephalopathy Active Problems:   Thrombocytopenia (HCC)   Lupus anticoagulant positive   HTN (hypertension)   Seizures (HCC)   Anemia in chronic kidney disease   ESRD on hemodialysis (HCC)   History of stroke   Acute metabolic encephalopathy  Assessment and Plan:   Acute right MCA patchy infarct/ left thalamic and caudate head punctuate infarct: Likely embolic, secondary to antiphospholipid syndrome with underdosed Eliquis . Neurology following, MRI brain 5/6 no acute infarct, MRI brain 5/11 multiple acute infarcts S/p completion of stroke workup: MRA patency of major intracranial arteries. A1c 5.6.LDL: 75.  HDL: 26.  Carotid duplex no significant finding TTE:EF 70% mitral valve calcified, restricted possible rheumatic disease, mod MS mild MR Noncompliant  anticoagulation at home on 2.5 ELIQUIS  BID Continue Eliquis  at 5 mg twice daily, started on Lipitor 40 Cont aggressive stroke risk modification , PTOT W/ Plan for SNF with placement now that patient is behaviorally stable on Seroquel at bedtime.  No further use of Haldol  or Ativan  required over the last 48 hours.   Acute encephalopathy with delirium/agitation Seizure disorder w/ breakthrough seizure on 10/25/23: Initially thought due to postictal state, but persisted. S.p MRI- most llikely that the encephalopathy is 2/2 strokes involving the frontal region. Work up [employer]: WNL.HIV: Nonreactive.B12: 539 B1 nl, RPR nonreactive. B1: Within EEG notable for BL sharps as well as right frontal subclinical seizure.Off high-dose.  Mentation overall stable alert awake following commands appropriately not agitated.  Neurology signed off, continue on increased dose of Keppra  500 twice daily and 500 MWF, Depakote  1000 mg q12 -Plan to start on Seroquel at bedtime 5/24 which has been helping with behavioral disturbances.  Patient has not required Ativan  or Haldol .   ESRD on HD MWF: Secondary hyperparathyroidism/MBD Anemia of CKD Nephrology following continue HD-with next session today 5/26 Continue to monitor calcium  close and hemoglobin intermittently. Continue Renvela , calcitriol  and Sensipar    Hypertension Well-controlled . Off amlodipine  and Coreg  due to hypotension   Functional impairment Debility/deconditioning: Continue PT/OT -recommending skilled nursing facility> medically stable and awaiting for placement  Class I Obesity w/ Body mass index is 31.69 kg/m.: Will benefit with PCP follow-up, weight loss,healthy lifestyle and outpatient sleep eval if not done.     DVT prophylaxis:apixaban  Code Status: Full Family Communication: Friend at bedside 5/25 Disposition Plan: Transfer to SNF/rehab once authorization received Status is: Inpatient Remains inpatient appropriate because: Need  for placement and IV medications.   Consultants:  Neurology Nephrology  Procedures:  None  Antimicrobials:  None   Subjective: Patient seen and evaluated today while in hemodialysis.  Denies any complaints or concerns.  No acute overnight events noted.  Objective: Vitals:   11/11/23 0900 11/11/23 0930 11/11/23 1000 11/11/23 1030  BP: 106/83 (!) 131/103 121/81 97/76  Pulse: 62 67 72 70  Resp: 12 12 10 10   Temp:      TempSrc:      SpO2: 100% 100% 100% 100%  Weight:      Height:        Intake/Output Summary (Last 24 hours) at 11/11/2023 1048 Last data filed at 11/11/2023 0758 Gross per 24 hour  Intake 120 ml  Output 0 ml  Net 120 ml   Filed Weights   11/06/23 1725 11/06/23 1727 11/11/23 0816  Weight: 108.2 kg 106 kg 107.6 kg    Examination:  General exam: Appears calm Respiratory system: Clear to auscultation. Respiratory effort normal.  Right-sided PermCath C/D/I. Cardiovascular system: S1 & S2 heard, RRR.  Gastrointestinal system: Abdomen is soft Central nervous system: Asleep, but arousable Extremities: No edema Skin: No significant lesions noted    Data Reviewed: I have personally reviewed following labs and imaging studies  CBC: Recent Labs  Lab 11/06/23 0536 11/10/23 0704  WBC 6.9 7.1  HGB 13.2 12.4*  HCT 39.0 37.2*  MCV 90.3 93.7  PLT 72* 99*   Basic Metabolic Panel: Recent Labs  Lab 11/07/23 0441 11/08/23 0629 11/09/23 0703 11/10/23 0704 11/11/23 0630  NA 133* 136 133* 134* 135  K 4.0 4.5 4.7 4.5 4.5  CL 95* 97* 98 97* 95*  CO2 25 22 21* 22 23  GLUCOSE 102* 64* 74 71 74  BUN 38* 53* 35* 52* 73*  CREATININE 12.09* 15.74* 11.72* 15.39* 17.51*  CALCIUM  10.3 10.2 10.0 10.2 10.5*  MG  --   --   --  2.7*  --   PHOS 5.6* 7.8* 5.0* 7.0* 7.9*   GFR: Estimated Creatinine Clearance: 6.8 mL/min (A) (by C-G formula based on SCr of 17.51 mg/dL (H)). Liver Function Tests: Recent Labs  Lab 11/07/23 0441 11/08/23 0629 11/09/23 0703  11/10/23 0704 11/11/23 0630  ALBUMIN  3.8 3.7 3.7 3.5 3.7   No results for input(s): "LIPASE", "AMYLASE" in the last 168 hours. No results for input(s): "AMMONIA" in the last 168 hours. Coagulation Profile: No results for input(s): "INR", "PROTIME" in the last 168 hours. Cardiac Enzymes: No results for input(s): "CKTOTAL", "CKMB", "CKMBINDEX", "TROPONINI" in the last 168 hours. BNP (last 3 results) No results for input(s): "PROBNP" in the last 8760 hours. HbA1C: No results for input(s): "HGBA1C" in the last 72 hours. CBG: No results for input(s): "GLUCAP" in the last 168 hours. Lipid Profile: No results for input(s): "CHOL", "HDL", "LDLCALC", "TRIG", "CHOLHDL", "LDLDIRECT" in the last 72 hours. Thyroid Function Tests: No results for input(s): "TSH", "T4TOTAL", "FREET4", "T3FREE", "THYROIDAB" in the last 72 hours. Anemia Panel: No results for input(s): "VITAMINB12", "FOLATE", "FERRITIN", "TIBC", "IRON ", "RETICCTPCT" in the last 72 hours. Sepsis Labs: No results for input(s): "PROCALCITON", "LATICACIDVEN" in the last 168 hours.  Recent Results (from the past 240 hours)  MRSA Next Gen by PCR, Nasal     Status: None   Collection Time: 11/01/23 10:53 AM   Specimen: Nasal Mucosa; Nasal Swab  Result Value Ref Range Status   MRSA by PCR Next Gen NOT DETECTED NOT DETECTED Final    Comment: (NOTE) The  GeneXpert MRSA Assay (FDA approved for NASAL specimens only), is one component of a comprehensive MRSA colonization surveillance program. It is not intended to diagnose MRSA infection nor to guide or monitor treatment for MRSA infections. Test performance is not FDA approved in patients less than 38 years old. Performed at [facility], [address], [place], [place] [address]          Radiology Studies: No results found.      Scheduled Meds:  apixaban   5 mg Oral BID   atorvastatin   40 mg Oral Daily   calcitRIOL   2.5 mcg Oral Q M,W,F-HD   Chlorhexidine  Gluconate  Cloth  6 each Topical Q0600   Chlorhexidine  Gluconate Cloth  6 each Topical Q0600   cinacalcet   150 mg Oral Q M,W,F-HD   divalproex   1,000 mg Oral Q12H   feeding supplement (NEPRO CARB STEADY)  237 mL Oral BID BM   levETIRAcetam   500 mg Oral BID   levETIRAcetam   500 mg Oral Q M,W,F-HD   multivitamin  1 tablet Oral QHS   QUEtiapine  25 mg Oral QHS   sevelamer  carbonate  3,200 mg Oral TID WC   sodium chloride  flush  10-40 mL Intracatheter Q12H     LOS: 21 days    Time spent: 55 minutes    [name] Leyba Loran Rock, DO [facility]  If 7PM-7AM, please contact night-coverage [email or web address] 11/11/2023, 10:48 AM

## 2023-11-12 DIAGNOSIS — R760 Raised antibody titer: Secondary | ICD-10-CM

## 2023-11-12 DIAGNOSIS — D696 Thrombocytopenia, unspecified: Secondary | ICD-10-CM | POA: Diagnosis not present

## 2023-11-12 DIAGNOSIS — G9341 Metabolic encephalopathy: Secondary | ICD-10-CM

## 2023-11-12 DIAGNOSIS — D631 Anemia in chronic kidney disease: Secondary | ICD-10-CM

## 2023-11-12 DIAGNOSIS — G9340 Encephalopathy, unspecified: Secondary | ICD-10-CM | POA: Diagnosis not present

## 2023-11-12 DIAGNOSIS — Z8673 Personal history of transient ischemic attack (TIA), and cerebral infarction without residual deficits: Secondary | ICD-10-CM

## 2023-11-12 DIAGNOSIS — R569 Unspecified convulsions: Secondary | ICD-10-CM | POA: Diagnosis not present

## 2023-11-12 LAB — RENAL FUNCTION PANEL
Albumin: 3.4 g/dL — ABNORMAL LOW (ref 3.5–5.0)
Anion gap: 16 — ABNORMAL HIGH (ref 5–15)
BUN: 53 mg/dL — ABNORMAL HIGH (ref 6–20)
CO2: 20 mmol/L — ABNORMAL LOW (ref 22–32)
Calcium: 9.8 mg/dL (ref 8.9–10.3)
Chloride: 99 mmol/L (ref 98–111)
Creatinine, Ser: 12.85 mg/dL — ABNORMAL HIGH (ref 0.61–1.24)
GFR, Estimated: 4 mL/min — ABNORMAL LOW (ref >60)
Glucose, Bld: 72 mg/dL (ref 70–99)
Phosphorus: 5.8 mg/dL — ABNORMAL HIGH (ref 2.5–4.6)
Potassium: 5.2 mmol/L — ABNORMAL HIGH (ref 3.5–5.1)
Sodium: 135 mmol/L (ref 135–145)

## 2023-11-12 MED ORDER — QUETIAPINE FUMARATE 25 MG PO TABS: 25.0000 mg | ORAL_TABLET | Freq: Every day | ORAL | Status: DC

## 2023-11-12 MED ORDER — CINACALCET HCL 30 MG PO TABS: 150.0000 mg | ORAL_TABLET | ORAL | Status: DC

## 2023-11-12 MED ORDER — CALCITRIOL 0.5 MCG PO CAPS: 2.5000 ug | ORAL_CAPSULE | ORAL | Status: DC

## 2023-11-12 MED ORDER — LEVETIRACETAM 500 MG PO TABS: 500.0000 mg | ORAL_TABLET | ORAL | Status: DC

## 2023-11-12 MED ORDER — NEPRO/CARBSTEADY PO LIQD: 237.0000 mL | Freq: Two times a day (BID) | ORAL | Status: DC

## 2023-11-12 MED ORDER — DIVALPROEX SODIUM 500 MG PO DR TAB: 1000.0000 mg | DELAYED_RELEASE_TABLET | Freq: Two times a day (BID) | ORAL | Status: AC

## 2023-11-12 MED ORDER — LEVETIRACETAM 500 MG PO TABS: 500.0000 mg | ORAL_TABLET | Freq: Two times a day (BID) | ORAL | Status: DC

## 2023-11-12 MED ORDER — APIXABAN 5 MG PO TABS: 5.0000 mg | ORAL_TABLET | Freq: Two times a day (BID) | ORAL | Status: AC

## 2023-11-12 NOTE — Progress Notes (Signed)
 Physical Therapy Treatment Patient Details Name: [name] [name] MRN: [id number] DOB: 1980/09/11 Today's Date: 11/12/2023   History of Present Illness Patient is a 43 year old male with witnessed seizure at dialysis on 10/21/2023. Patient leaving AMA, and returning a few hours later via [employer]. CT clear, and EEG showing moderate diffuse encephalopathy. MRI 10/27/23 showing Multiple acute infarcts in the right frontal, parietal and posterior temporal cortex as well as the adjacent subcortical white matter and punctate acute infarcts in the left basal ganglia, thalamus and overlying white matter. PMH: ESRD on HD, antiphospholipid syndrome, HTN, seizure, stroke.    PT Comments  Pt lethargic today but agreeable to PT.  Did utilize RW today due to fatigue/lethargy.  Pt demonstrated some weakness in L LE - cues for straightening with gait and then tolerated exercises well that focused on quad strengthening and terminal knee ext.  Some difficulty with commands for quad sets and heel raises -after which he request to return to room.  Cont POC.  Continues to demonstrate decreased safety and need for supervision - recommend Patient will benefit from continued inpatient follow up therapy, <3 hours/day at d/c.   If plan is discharge home, recommend the following: Assistance with cooking/housework;Supervision due to cognitive status;Help with stairs or ramp for entrance;A little help with walking and/or transfers   Can travel by private vehicle     No  Equipment Recommendations  Rolling walker (2 wheels);BSC/3in1    Recommendations for Other Services       Precautions / Restrictions Precautions Precautions: Fall     Mobility  Bed Mobility Overal bed mobility: Needs Assistance Bed Mobility: Supine to Sit     Supine to sit: Supervision          Transfers Overall transfer level: Needs assistance Equipment used: Rolling walker (2 wheels), None Transfers: Sit to/from Stand Sit to Stand:  Supervision           General transfer comment: Performed STS x 5 during session with and without RW.  Demonstrated controlled STS and did reach back with UE to lower.  Did occasionally need cue to get "lined up" correctly with chair - tending to go toward right side    Ambulation/Gait Ambulation/Gait assistance: Contact guard assist Gait Distance (Feet): 120 Feet (120'x2) Assistive device: Rolling walker (2 wheels), None Gait Pattern/deviations: Step-through pattern, Decreased stride length, Shuffle, Decreased dorsiflexion - left Gait velocity: decreased     General Gait Details: Pt lethargic and tired today so utilized RW.  L knee showing some instability but not complete buckle with stance phase.  Cued to focus on straightening knee on L during stance with some improvement. Min cues to avoid obstacles with RW.  Did some ambulation in rehab gym and room without AD (10-15' multiple times) -navigating frequent turns and around obstacles   Stairs Stairs: Yes Stairs assistance: Contact guard assist Stair Management: Two rails, Step to pattern, Forwards Number of Stairs: 2     Wheelchair Mobility     Tilt Bed    Modified Rankin (Stroke Patients Only) Modified Rankin (Stroke Patients Only) Pre-Morbid Rankin Score: Slight disability Modified Rankin: Moderately severe disability     Balance Overall balance assessment: Needs assistance Sitting-balance support: Feet supported Sitting balance-Leahy Scale: Good     Standing balance support: No upper extremity supported, Bilateral upper extremity supported Standing balance-Leahy Scale: Fair Standing balance comment: Using RW for longer distance but could ambulate some without AD  Communication    Cognition Arousal: Lethargic Behavior During Therapy: Flat affect   PT - Cognitive impairments: Problem solving, Sequencing                       PT - Cognition Comments: Minimally  verbal with increased time for motor planning.  Required cues, rewording, and demonstration for balance activities.  He was able to navigate back to room (3 turns) Following commands: Impaired Following commands impaired: Follows one step commands inconsistently, Follows one step commands with increased time    Cueing    Exercises General Exercises - Lower Extremity Quad Sets: Strengthening, Left, Seated, Limitations, 5 reps Quad Sets Limitations: increased cues, pt performed a couple times but then stated "I'm not sure what to do" and request to stop Long [employer]: AROM, 20 reps, Both, Seated, Limitations Long Arc Quad Limitations: Difficulty with terminal ext on L - cues for full ROM with target Heel Raises: Limitations Heel Raises Limitations: Attempted using rails on stairs but pt not able to follow commands (kept stepping up) then just requested to return to room Other Exercises Other Exercises: Step ups with L LE up forward and back down R LE backward; used rails and CGA; cues to focus on getting L LE straight; x 15    General Comments        Pertinent Vitals/Pain Pain Assessment Pain Assessment: No/denies pain    Home Living                          Prior Function            PT Goals (current goals can now be found in the care plan section) Progress towards PT goals: Progressing toward goals    Frequency    Min 2X/week      PT Plan      Co-evaluation              AM-PAC PT "6 Clicks" Mobility   Outcome Measure  Help needed turning from your back to your side while in a flat bed without using bedrails?: A Little Help needed moving from lying on your back to sitting on the side of a flat bed without using bedrails?: A Little Help needed moving to and from a bed to a chair (including a wheelchair)?: A Little Help needed standing up from a chair using your arms (e.g., wheelchair or bedside chair)?: A Little Help needed to walk in hospital room?:  A Little Help needed climbing 3-5 steps with a railing? : A Little 6 Click Score: 18    End of Session Equipment Utilized During Treatment: Gait belt Activity Tolerance: Patient tolerated treatment well Patient left: with call bell/phone within reach;with chair alarm set;in chair;with family/visitor present Nurse Communication: Mobility status PT Visit Diagnosis: Muscle weakness (generalized) (M62.81);Unsteadiness on feet (R26.81)     Time: 2536-6440 PT Time Calculation (min) (ACUTE ONLY): 31 min  Charges:    $Gait Training: 8-22 mins $Therapeutic Exercise: 8-22 mins PT General Charges $$ ACUTE PT VISIT: 1 Visit                     Cyd Dowse, PT Acute Rehab [facility] [phone]    Carolynn Citrin 11/12/2023, 2:13 PM

## 2023-11-12 NOTE — Progress Notes (Signed)
 [facility] here to take patient to [facility]. Patient with no complaints at this time. [facility] given report by Herman Longs, Tax inspector.PIV removed intact.

## 2023-11-12 NOTE — TOC Transition Note (Signed)
 Transition of Care [facility]) - Discharge Note   Patient Details  Name: [name] [name] MRN: [id number] Date of Birth: Oct 10, 1980  [facility] CM/SW Contact:  Tandy Fam, LCSW Phone Number: 11/12/2023, 3:51 PM   Clinical Narrative:  Patient received insurance approval for admission to SNF and [facility] has bed available. CSW updated MD, sent discharge information to [facility]. CSW coordinated with renal navigator and sent HD information to [facility] for arranging transportation to HD. Transport setup with PTAR for next available. CSW contacted Rashawn to provide update on patient's discharge, left a voicemail.  Nurse to call report to [phone], Room 121b    Final next level of care: Skilled Nursing Facility Barriers to Discharge: Barriers Resolved   Patient Goals and CMS Choice Patient states their goals for this hospitalization and ongoing recovery are:: to return home          Discharge Placement              Patient chooses bed at: [facility] Patient to be transferred to facility by: PTAR Name of family member notified: Rashawn Patient and family notified of of transfer: 11/12/23  Discharge Plan and Services Additional resources added to the After Visit Summary for                                       Social Drivers of Health (SDOH) Interventions SDOH Screenings   Food Insecurity: No Food Insecurity (10/21/2023)  Housing: High Risk (10/21/2023)  Transportation Needs: No Transportation Needs (10/21/2023)  Utilities: Not At Risk (10/21/2023)  Alcohol Screen: Low Risk  (03/26/2023)  Depression (PHQ2-9): Low Risk  (03/26/2023)  Financial Resource Strain: Low Risk  (03/26/2023)  Physical Activity: Inactive (03/26/2023)  Social Connections: Moderately Isolated (10/21/2023)  Stress: No Stress Concern Present (03/26/2023)  Tobacco Use: High Risk (10/21/2023)  Health Literacy: Adequate Health Literacy  (03/26/2023)     Readmission Risk Interventions    03/28/2023    2:57 PM 01/25/2023    3:40 PM  Readmission Risk Prevention Plan  Post Dischage Appt    Medication Screening    Transportation Screening Complete   Medication Review (RN Care Manager) Referral to Pharmacy   PCP or Specialist appointment within 3-5 days of discharge Complete   HRI or Home Care Consult Complete   SW Recovery Care/Counseling Consult Complete   Palliative Care Screening Not Applicable   Skilled Nursing Facility Not Applicable      Information is confidential and restricted. Go to Review Flowsheets to unlock data.

## 2023-11-12 NOTE — Discharge Summary (Signed)
 Physician Discharge Summary  [name] [name] XBJ:[id number] DOB: 1980/07/30 DOA: 10/20/2023  PCP: Collins Dean, NP  Admit date: 10/20/2023 Discharge date: 11/12/23  Admitted From: Home.  States with friend. Disposition: SNF Recommendations for Outpatient Follow-up:  Outpatient follow-up with neurology as below Monitor blood pressure.  Check CMP and CBC in 1 week Please follow up on the following pending results: None   Discharge Condition: Stable CODE STATUS: Full code  Contact information for follow-up providers     [facility]. Schedule an appointment as soon as possible for a visit in 1 month(s).   Specialty: Neurology Why: stroke clinic Contact information: [address] [place]  [address] [phone]             Contact information for after-discharge care     Destination     [facility] Preferred SNF .   Service: Skilled Nursing Contact information: [address] [place]  [address] [phone]                     Hospital course 43 y.o. male with history of ESRD on HD MWF, HTN, prior CVA, seizure, lupus anticoagulant brought to the ER on 5/5 after patient had a seizure, encephalopathy. Neurology was consulted and started on Keppra  and Depakote . MRI on 5/6 showed no evidence of acute intracranial abnormality, and multiple remote infarcts. Patient remained encephalopathic  Patient had breakthrough seizure on 5/9 while he on HD.  EEG is suggestive of moderate diffuse encephalopathy.  Sharp transients were noted in the right frontal region. MRI obtained and showed Acute right MCA patchy infarct/ left thalamic and caudate head punctuate infarct.  Neurology recommended increasing home Eliquis  from 2.5 mg twice daily to 5 mg twice daily.   Hospital course complicated by delirium and agitation that has improved after starting nightly low-dose Seroquel.  Therapy  recommended SNF.  Therapy recommended SNF.  See individual problem list below for more.   Problems addressed during this hospitalization Acute right MCA patchy infarct/ left thalamic and caudate head punctuate infarct: Likely embolic given history of antiphospholipid syndrome with underdosed Eliquis .  Also not compliant at home.  Initial MRI negative but repeat MRI showed stroke as above.  MRA head, carotid Dopplers and TTE without significant finding.  LDL 75.  A1c 5.6%. -Eliquis  increased to 5 mg twice daily per neurology. -Continue Lipitor 40 mg daily -PT/OT at SNF  Breakthrough seizure on 10/25/23 -Keppra  500 mg twice daily and 500 mg MWF after HD -Depakote  1000 mg every 12 hours -Seizure precautions  Acute encephalopathy with delirium/agitation: Multifactorial including CVA, seizure.  Might have underlying cognitive impairment.  TSH, HIV, B12, B1 and RPR within normal.  Initial MRI on 5/6 with multiple remote infarcts.  Repeat MRI on 5/11 showed acute infarcts.  EEG as above.  Currently awake and alert and oriented x 4 except date of months. -Manage CVA and breakthrough seizure as above -Reorientation and delirium precautions -Minimize sedating medications  Lupus anticoagulants -Eliquis  as above.   ESRD on HD MWF: Secondary hyperparathyroidism/MBD Anemia of renal disease -Continue HD MWF -Continue Renvela , calcitriol  and Sensipar    Hypertension: Normotensive off home amlodipine  and Coreg . -Discontinued home amlodipine  and Coreg    Functional impairment Debility/deconditioning: -Therapy recommended SNF   Class I obesity Body mass index is 32.16 kg/m.           Time spent 35 minutes  Vital signs Vitals:   11/12/23 6644 11/12/23 0359 11/12/23 0347  11/12/23 1155  BP: (!) 146/92 129/87 (!) 136/95 117/78  Pulse: 66 69 65 79  Temp: 98 F (36.7 C) 97.9 F (36.6 C) 97.6 F (36.4 C) 98 F (36.7 C)  Resp: 18 16 18 18   Height:      Weight:      SpO2: 99% 93%  98%   TempSrc: Oral Oral Oral Oral  BMI (Calculated):         Discharge exam  GENERAL: No apparent distress.  Nontoxic. HEENT: MMM.  Vision and hearing grossly intact.  NECK: Supple.  No apparent JVD.  RESP:  No IWOB.  Fair aeration bilaterally. CVS:  RRR. Heart sounds normal.  ABD/GI/GU: BS+. Abd soft, NTND.  MSK/EXT:  Moves extremities. No apparent deformity. No edema.  SKIN: no apparent skin lesion or wound NEURO: Awake and alert. Oriented x 4 except date of months.  Follows commands.  No apparent focal neuro deficit. PSYCH: Calm. Normal affect.   Discharge Instructions Discharge Instructions     Ambulatory referral to Neurology   Complete by: As directed    Follow up with stroke clinic NP at [facility] in about 4-6 weeks. Thanks.   Diet - low sodium heart healthy   Complete by: As directed    Diet renal with fluid restriction Fluid restriction: 1200 mL Fluid   Increase activity slowly   Complete by: As directed    No wound care   Complete by: As directed       Allergies as of 11/12/2023       Reactions   Chlorhexidine  Itching   Cetirizine & Related Swelling        Medication List     STOP taking these medications    amLODipine  10 MG tablet Commonly known as: NORVASC    carvedilol  6.25 MG tablet Commonly known as: COREG        TAKE these medications    acetaminophen  325 MG tablet Commonly known as: TYLENOL  Take 2 tablets (650 mg total) by mouth every 6 (six) hours as needed for mild pain.   apixaban  5 MG Tabs tablet Commonly known as: Eliquis  Take 1 tablet (5 mg total) by mouth 2 (two) times daily. What changed:  medication strength how much to take   atorvastatin  40 MG tablet Commonly known as: LIPITOR Take 1 tablet (40 mg total) by mouth daily.   Auryxia  1 GM 210 MG(Fe) tablet Generic drug: ferric citrate  Take 840 mg by mouth 3 (three) times daily.   calcitRIOL  0.5 MCG capsule Commonly known as: ROCALTROL  Take 5 capsules (2.5 mcg total) by mouth  every Monday, Wednesday, and Friday with hemodialysis. Start taking on: Nov 13, 2023   cinacalcet  30 MG tablet Commonly known as: SENSIPAR  Take 5 tablets (150 mg total) by mouth every Monday, Wednesday, and Friday with hemodialysis. Start taking on: Nov 13, 2023   divalproex  500 MG DR tablet Commonly known as: DEPAKOTE  Take 2 tablets (1,000 mg total) by mouth every 12 (twelve) hours.   feeding supplement (NEPRO CARB STEADY) Liqd Take 237 mLs by mouth 2 (two) times daily between meals. Start taking on: Nov 13, 2023   levETIRAcetam  500 MG tablet Commonly known as: Keppra  Take 1 tablet (500 mg total) by mouth 2 (two) times daily. What changed: how much to take   levETIRAcetam  500 MG tablet Commonly known as: KEPPRA  Take 1 tablet (500 mg total) by mouth every Monday, Wednesday, and Friday with hemodialysis. Start taking on: Nov 13, 2023 What changed: You were already taking a  medication with the same name, and this prescription was added. Make sure you understand how and when to take each.   multivitamin Tabs tablet Take 1 tablet by mouth daily.   QUEtiapine 25 MG tablet Commonly known as: SEROQUEL Take 1 tablet (25 mg total) by mouth at bedtime.   sevelamer  carbonate 800 MG tablet Commonly known as: RENVELA  Take 3,200 mg by mouth 3 (three) times daily with meals.        Consultations: Neurology Nephrology  Procedures/Studies:   VAS US  CAROTID Result Date: 11/01/2023 Carotid Arterial Duplex Study Patient Name:  [name] [name]  Date of Exam:   10/29/2023 Medical Rec #: [id number]       Accession #:    [id number] Date of Birth: 05-26-1981      Patient Gender: M Patient Age:   84 years Exam Location:  [facility] Procedure:      VAS US  CAROTID Referring Phys: Jeanella Milan [facility] --------------------------------------------------------------------------------  Indications:       CVA. Risk Factors:      Hypertension. Comparison Study:  01/28/2023 - Right Carotid:  Velocities in the right ICA are                    consistent with a 1-39% stenosis.                     Left Carotid: Velocities in the left ICA are consistent with                    a 1-39%                    stenosis.                     Vertebrals: Bilateral vertebral arteries demonstrate                    antegrade flow.                    Subclavians: Normal flow hemodynamics were seen in bilateral                    subclavian arteries. Performing Technologist: Lerry Ransom RVT  Examination Guidelines: A complete evaluation includes B-mode imaging, spectral Doppler, color Doppler, and power Doppler as needed of all accessible portions of each vessel. Bilateral testing is considered an integral part of a complete examination. Limited examinations for reoccurring indications may be performed as noted.  Right Carotid Findings: +----------+--------+--------+--------+-----------------------+--------+           PSV cm/sEDV cm/sStenosisPlaque Description     Comments +----------+--------+--------+--------+-----------------------+--------+ CCA Prox  57      15              smooth and heterogenous         +----------+--------+--------+--------+-----------------------+--------+ CCA Distal69      20              smooth and heterogenous         +----------+--------+--------+--------+-----------------------+--------+ ICA Prox  75      16                                     tortuous +----------+--------+--------+--------+-----------------------+--------+ ICA Mid   57      20  tortuous +----------+--------+--------+--------+-----------------------+--------+ ICA Distal53      23                                     tortuous +----------+--------+--------+--------+-----------------------+--------+ ECA       53      12                                              +----------+--------+--------+--------+-----------------------+--------+  +----------+--------+-------+--------+-------------------+           PSV cm/sEDV cmsDescribeArm Pressure (mmHG) +----------+--------+-------+--------+-------------------+ [id number]                                         +----------+--------+-------+--------+-------------------+ +---------+--------+--+--------+--+---------+ VertebralPSV cm/s46EDV cm/s20Antegrade +---------+--------+--+--------+--+---------+  Left Carotid Findings: +----------+--------+--------+--------+-----------------------+--------+           PSV cm/sEDV cm/sStenosisPlaque Description     Comments +----------+--------+--------+--------+-----------------------+--------+ CCA Prox  83      19              smooth and heterogenous         +----------+--------+--------+--------+-----------------------+--------+ CCA Distal77      18              smooth and heterogenous         +----------+--------+--------+--------+-----------------------+--------+ ICA Prox  73      35              smooth and heterogenous         +----------+--------+--------+--------+-----------------------+--------+ ICA Mid   58      21                                     tortuous +----------+--------+--------+--------+-----------------------+--------+ ICA Distal65      23                                     tortuous +----------+--------+--------+--------+-----------------------+--------+ ECA       47      10                                              +----------+--------+--------+--------+-----------------------+--------+ +----------+--------+--------+--------+-------------------+           PSV cm/sEDV cm/sDescribeArm Pressure (mmHG) +----------+--------+--------+--------+-------------------+ Subclavian102                                         +----------+--------+--------+--------+-------------------+ +---------+--------+--+--------+--+---------+ VertebralPSV cm/s45EDV cm/s12Antegrade  +---------+--------+--+--------+--+---------+   Summary: Right Carotid: Velocities in the right ICA are consistent with a 1-39% stenosis. Left Carotid: Velocities in the left ICA are consistent with a 1-39% stenosis. Vertebrals: Bilateral vertebral arteries demonstrate antegrade flow. *See table(s) above for measurements and observations.  Electronically signed by Ardella Beaver MD on 11/01/2023 at 10:22:02 AM.    Final    ECHOCARDIOGRAM COMPLETE BUBBLE STUDY Result Date: 10/28/2023    ECHOCARDIOGRAM REPORT   Patient Name:  [name] [name] Date of Exam: 10/28/2023 Medical Rec #:  [id number]      Height:       72.0 in Accession #:    [id number]     Weight:       235.2 lb Date of Birth:  1980-10-03     BSA:          2.283 m Patient Age:    42 years       BP:           131/90 mmHg Patient Gender: M              HR:           78 bpm. Exam Location:  Inpatient Procedure: 2D Echo (Both Spectral and Color Flow Doppler were utilized during            procedure). Indications:    stroke  History:        Patient has prior history of Echocardiogram examinations, most                 recent 01/19/2023. End stage renal disease. Lupus.; Risk                 Factors:Current Smoker and Hypertension.  Sonographer:    Dione Franks RDCS Referring Phys: [id number] Jilda Most MDALA-GAUSI IMPRESSIONS  1. Left ventricular ejection fraction, by estimation, is 65 to 70%. The left ventricle has normal function. The left ventricle has no regional wall motion abnormalities. There is moderate concentric left ventricular hypertrophy. Left ventricular diastolic parameters are consistent with Grade I diastolic dysfunction (impaired relaxation).  2. Right ventricular systolic function is normal. The right ventricular size is normal. Tricuspid regurgitation signal is inadequate for assessing PA pressure.  3. Left atrial size was mildly dilated.  4. The mitral valve is calcified and restricted, possible rheumatic disease. Mild mitral valve  regurgitation. Moderate mitral stenosis with mean gradient 5 mmHg, MVA 1.28 cm^2 by VTI and 1.38 cm^2 by PHT.  5. The aortic valve is tricuspid. There is mild calcification of the aortic valve. Aortic valve regurgitation is not visualized. No aortic stenosis is present.  6. The inferior vena cava is normal in size with greater than 50% respiratory variability, suggesting right atrial pressure of 3 mmHg.  7. Bubble study was unsuccessful. FINDINGS  Left Ventricle: Left ventricular ejection fraction, by estimation, is 65 to 70%. The left ventricle has normal function. The left ventricle has no regional wall motion abnormalities. The left ventricular internal cavity size was normal in size. There is  moderate concentric left ventricular hypertrophy. Left ventricular diastolic parameters are consistent with Grade I diastolic dysfunction (impaired relaxation). Right Ventricle: The right ventricular size is normal. No increase in right ventricular wall thickness. Right ventricular systolic function is normal. Tricuspid regurgitation signal is inadequate for assessing PA pressure. Left Atrium: Left atrial size was mildly dilated. Right Atrium: Right atrial size was normal in size. Pericardium: There is no evidence of pericardial effusion. Mitral Valve: The mitral valve is rheumatic. There is moderate calcification of the mitral valve leaflet(s). Moderately decreased mobility of the mitral valve leaflets. Mild to moderate mitral annular calcification. Mild mitral valve regurgitation. Moderate mitral valve stenosis. MV peak gradient, 12.3 mmHg. The mean mitral valve gradient is 5.0 mmHg. Tricuspid Valve: The tricuspid valve is normal in structure. Tricuspid valve regurgitation is not demonstrated. Aortic Valve: The aortic valve is tricuspid. There is mild calcification of the aortic valve. Aortic valve regurgitation is not visualized.  No aortic stenosis is present. Pulmonic Valve: The pulmonic valve was normal in structure.  Pulmonic valve regurgitation is not visualized. Aorta: The aortic root is normal in size and structure. Venous: The inferior vena cava is normal in size with greater than 50% respiratory variability, suggesting right atrial pressure of 3 mmHg. IAS/Shunts: Bubble study was unsuccessful. Agitated saline contrast was given intravenously to evaluate for intracardiac shunting.  LEFT VENTRICLE PLAX 2D LVIDd:         3.50 cm LVIDs:         2.50 cm LV PW:         1.40 cm LV IVS:        1.10 cm LVOT diam:     2.20 cm LV SV:         61 LV SV Index:   27 LVOT Area:     3.80 cm  RIGHT VENTRICLE             IVC RV Basal diam:  2.70 cm     IVC diam: 1.30 cm RV S prime:     12.80 cm/s TAPSE (M-mode): 0.8 cm LEFT ATRIUM             Index        RIGHT ATRIUM           Index LA diam:        4.10 cm 1.80 cm/m   RA Area:     14.10 cm LA Vol (A2C):   69.8 ml 30.58 ml/m  RA Volume:   30.20 ml  13.23 ml/m LA Vol (A4C):   73.1 ml 32.02 ml/m LA Biplane Vol: 75.7 ml 33.16 ml/m  AORTIC VALVE LVOT Vmax:   129.00 cm/s LVOT Vmean:  83.600 cm/s LVOT VTI:    0.160 m  AORTA Ao Root diam: 3.50 cm Ao Asc diam:  3.40 cm MITRAL VALVE MV Area (PHT): 1.38 cm   SHUNTS MV Area VTI:   1.39 cm   Systemic VTI:  0.16 m MV Peak grad:  12.3 mmHg  Systemic Diam: 2.20 cm MV Mean grad:  5.0 mmHg MV Vmax:       1.76 m/s MV Vmean:      95.6 cm/s Dalton McleanMD Electronically signed by Archer Bear Signature Date/Time: 10/28/2023/6:30:21 PM    Final    MR ANGIO HEAD WO CONTRAST Result Date: 10/27/2023 CLINICAL DATA:  Stroke suspected EXAM: MRA HEAD WITHOUT CONTRAST TECHNIQUE: Angiographic images of the Circle of Willis were acquired using MRA technique without intravenous contrast. COMPARISON:  Brain MRI from earlier today FINDINGS: Severe motion artifact, nondiagnostic except showing patency of the carotid, vertebral, basilar, and major proximal branches. Cannot assess for stenosis or aneurysm. IMPRESSION: Severe motion artifact, non diagnostic other  than documenting patency of the major intracranial arteries. Electronically Signed   By: Ronnette Coke M.D.   On: 10/27/2023 12:54   MR BRAIN WO CONTRAST Result Date: 10/27/2023 CLINICAL DATA:  Mental status change, unknown cause EXAM: MRI HEAD WITHOUT CONTRAST TECHNIQUE: Multiplanar, multiecho pulse sequences of the brain and surrounding structures were obtained without intravenous contrast. COMPARISON:  MRI Oct 22, 2023. FINDINGS: Brain: Multiple acute infarcts in the right frontal, parietal and posterior temporal cortex as well as the adjacent subcortical white matter (right MCA territory). A few punctate acute infarcts in the left basal ganglia, thalamus and overlying white matter. No significant mass effect. Advanced chronic microvascular ischemic disease including T2/FLAIR hyperintensity in the white matter as well as remote left frontoparietal  and bilateral occipital infarcts. Remote bilateral cerebellar infarcts. No evidence of acute hemorrhage, mass lesion, midline shift or hydrocephalus. Remote hemorrhage in the left occipital region associated with prior infarct. Vascular: Major arterial flow voids are maintained at the skull base. Skull and upper cervical spine: Normal marrow signal. Sinuses/Orbits: Clear sinuses.  No acute orbital findings. IMPRESSION: 1. Multiple acute infarcts in the right frontal, parietal and posterior temporal cortex as well as the adjacent subcortical white matter (potentially watershed or right MCA territory). 2. A few punctate acute infarcts in the left basal ganglia, thalamus and overlying white matter. 3. Advanced chronic microvascular ischemic disease, detailed above. Imaging results were communicated on 10/27/2023 at 11:34 am to provider Dr. Murvin Arthurs via telephone. Electronically Signed   By: Stevenson Elbe M.D.   On: 10/27/2023 11:37   Overnight EEG with video Result Date: 10/25/2023 Arleene Lack, MD     10/26/2023  9:28 AM Patient Name: [name] [name] MRN:  [id number] Epilepsy Attending: Arleene Lack Referring Physician/Provider: Khaliqdina, Salman, MD Duration: 10/24/2023 1141 to 10/26/2023 0500 Patient history: 43 y.o. male with hx of ESRD on hemodialysis, hypertension, prior stroke, history of seizures, lupus anticoagulant on Eiquis was brought to the ER for seizure and altered mental status. EEG to evaluate for seizure. Level of alertness: Awake AEDs during EEG study: LEV Technical aspects: This EEG study was done with scalp electrodes positioned according to the 10-20 International system of electrode placement. Electrical activity was reviewed with band pass filter of 1-70Hz , sensitivity of 7 uV/mm, display speed of 47mm/sec with a 60Hz  notched filter applied as appropriate. EEG data were recorded continuously and digitally stored.  Video monitoring was available and reviewed as appropriate. Description: EEG showed continuous generalized high amplitude sharply contoured and at times rhythmic 3 to 6 Hz theta-delta slowing. Sharp waves were noted in left and right fronto-temporal region. Hyperventilation and photic stimulation were not performed. One seizure without clinical signs was noted on 10/24/2023 at 1754. EEG showed rhythmic sharply contoured 4-5hz  theta slowing in right>left frontal region which then evolved into 2-3hz  spike and wave in right fronto-temporal region. This was followed by 5-6hz  high amplitude that slowing in right hemisphere which then also involved left hemisphere. Subsequently eeg evolved into 1-1.5hz  delta slowing. Duration of seizure was about 2 minutes. Parts of study was technically difficult due to significant electrode artifact. Between 10/24/2023 2200 to 10/25/2023 1428, EEG ws not interpretable due to electrode artifact. ABNORMALITY - Seizure without clinical signs, right frontal region - Sharp waves, left fronto-temporal region - Sharp waves, right fronto-temporal region - Continuous slow, generalized IMPRESSION: This technically  difficult study showed one seizure without clinical signs on 10/24/2023 at 1754 arising from right frontal region, lasting about 2 minutes. Additionally there was independent epileptogenicity arising from left and right fronto-temporal region. Lastly there was moderate diffuse encephalopathy. Arleene Lack   MR BRAIN WO CONTRAST Result Date: 10/22/2023 CLINICAL DATA:  Neuro deficit, acute, stroke suspected EXAM: MRI HEAD WITHOUT CONTRAST TECHNIQUE: Multiplanar, multiecho pulse sequences of the brain and surrounding structures were obtained without intravenous contrast. COMPARISON:  CT head May 4, 25. FINDINGS: Motion limited study.  Within this limitation: Brain: No acute infarction, hemorrhage, hydrocephalus, extra-axial collection or mass lesion. Remote bilateral cerebellar infarcts. Remote left frontoparietal and bilateral occipital infarcts. Advanced patchy and confluent T2/FLAIR hyperintensity in the white matter, compatible with chronic microvascular ischemic disease. Vascular: Major arterial flow voids are maintained at the skull base. Skull and upper cervical spine: Normal marrow  signal. Sinuses/Orbits: Clear sinuses.  No acute orbital findings. IMPRESSION: 1. No evidence of acute intracranial abnormality. 2. Multiple remote infarcts and advanced chronic microvascular ischemic disease. Electronically Signed   By: Stevenson Elbe M.D.   On: 10/22/2023 19:57   Overnight EEG with video Result Date: 10/22/2023 Arleene Lack, MD     10/22/2023  9:52 AM Patient Name: [name] [name] MRN: [id number] Epilepsy Attending: Arleene Lack Referring Physician/Provider: Arleene Lack, MD Duration: 10/21/2023 1032 to 10/22/2023 4098  Patient history:  43 y.o. male with history of ESRD on hemodialysis, hypertension, prior stroke, history of seizures, lupus anticoagulant was brought to the ER after patient had a seizure.  EEG to evaluate for seizure.  Level of alertness: Awake, asleep  AEDs during EEG study: None   Technical aspects: This EEG study was done with scalp electrodes positioned according to the 10-20 International system of electrode placement. Electrical activity was reviewed with band pass filter of 1-70Hz , sensitivity of 7 uV/mm, display speed of 45mm/sec with a 60Hz  notched filter applied as appropriate. EEG data were recorded continuously and digitally stored.  Video monitoring was available and reviewed as appropriate.  Description: During awake state, no clear posterior dominant rhythm was seen.  Sleep was characterized by sleep spindles (13 to 15 Hz), maximal frontocentral region. EEG showed continuous generalized and at times rhythmic 3 to 6 Hz theta-delta slowing.   Hyperventilation and photic stimulation were not performed.    EEG was disconnected between 10/21/2023 1057 to 2329 for dialysis and imaging.  ABNORMALITY - Continuous slow, generalized  IMPRESSION: This study is suggestive of moderate diffuse encephalopathy. No seizures were noted.  Arleene Lack   EEG adult Result Date: 10/21/2023 Arleene Lack, MD     10/21/2023 11:47 AM Patient Name: [name] [name] MRN: [id number] Epilepsy Attending: Arleene Lack Referring Physician/Provider: Angelene Kelly, MD Date:  10/21/2023 Duration: 23.18 mins Patient history:  43 y.o. male with history of ESRD on hemodialysis, hypertension, prior stroke, history of seizures, lupus anticoagulant was brought to the ER after patient had a seizure.  EEG to evaluate for seizure. Level of alertness: Awake, asleep AEDs during EEG study: None Technical aspects: This EEG study was done with scalp electrodes positioned according to the 10-20 International system of electrode placement. Electrical activity was reviewed with band pass filter of 1-70Hz , sensitivity of 7 uV/mm, display speed of 55mm/sec with a 60Hz  notched filter applied as appropriate. EEG data were recorded continuously and digitally stored.  Video monitoring was available and reviewed as  appropriate. Description: During awake state, no clear posterior dominant rhythm was seen.  Sleep was characterized by sleep spindles (13 to 15 Hz), maximal frontocentral region.  EEG showed continuous generalized and at times rhythmic 3 to 6 Hz theta-delta slowing.  Sharp transients were noted in right frontal region. Hyperventilation and photic stimulation were not performed.   ABNORMALITY - Continuous slow, generalized IMPRESSION: This study is suggestive of moderate diffuse encephalopathy.  Sharp transients were noted in the right frontal region.  Because EEG showed rhythmic slowing as well as sharp transients, would recommend long-term EEG monitoring for further evaluation. Priyanka O Yadav   CT Head Wo Contrast Result Date: 10/20/2023 CLINICAL DATA:  Seizure disorder, or altered mental status EXAM: CT HEAD WITHOUT CONTRAST TECHNIQUE: Contiguous axial images were obtained from the base of the skull through the vertex without intravenous contrast. RADIATION DOSE REDUCTION: This exam was performed according to the departmental dose-optimization program which includes  automated exposure control, adjustment of the mA and/or kV according to patient size and/or use of iterative reconstruction technique. COMPARISON:  CT head 08/01/2023 FINDINGS: Brain: No intracranial hemorrhage, mass effect, or evidence of acute infarct. No hydrocephalus. No extra-axial fluid collection. Age-commensurate cerebral atrophy and chronic small vessel ischemic disease. Chronic left occipital, left insular, left parietal, left cerebellar, and right frontal infarcts. Vascular: No hyperdense vessel. Intracranial arterial calcification. Skull: No fracture or focal lesion. Sinuses/Orbits: No acute finding. Other: None. IMPRESSION: 1. No acute intracranial abnormality.  Multiple chronic infarcts. Electronically Signed   By: Rozell Cornet M.D.   On: 10/20/2023 20:24       The results of significant diagnostics from this hospitalization  (including imaging, microbiology, ancillary and laboratory) are listed below for reference.     Microbiology: No results found for this or any previous visit (from the past 240 hours).   Labs:  CBC: Recent Labs  Lab 11/06/23 0536 11/10/23 0704 11/11/23 1332  WBC 6.9 7.1 7.0  HGB 13.2 12.4* 11.9*  HCT 39.0 37.2* 35.5*  MCV 90.3 93.7 91.3  PLT 72* 99* 119*   BMP &GFR Recent Labs  Lab 11/08/23 0629 11/09/23 0703 11/10/23 0704 11/11/23 0630 11/12/23 0528  NA 136 133* 134* 135 135  K 4.5 4.7 4.5 4.5 5.2*  CL 97* 98 97* 95* 99  CO2 22 21* 22 23 20*  GLUCOSE 64* 74 71 74 72  BUN 53* 35* 52* 73* 53*  CREATININE 15.74* 11.72* 15.39* 17.51* 12.85*  CALCIUM  10.2 10.0 10.2 10.5* 9.8  MG  --   --  2.7*  --   --   PHOS 7.8* 5.0* 7.0* 7.9* 5.8*   Estimated Creatinine Clearance: 9.3 mL/min (A) (by C-G formula based on SCr of 12.85 mg/dL (H)). Liver & Pancreas: Recent Labs  Lab 11/08/23 0629 11/09/23 0703 11/10/23 0704 11/11/23 0630 11/12/23 0528  ALBUMIN  3.7 3.7 3.5 3.7 3.4*   No results for input(s): "LIPASE", "AMYLASE" in the last 168 hours. No results for input(s): "AMMONIA" in the last 168 hours. Diabetic: No results for input(s): "HGBA1C" in the last 72 hours. No results for input(s): "GLUCAP" in the last 168 hours. Cardiac Enzymes: No results for input(s): "CKTOTAL", "CKMB", "CKMBINDEX", "TROPONINI" in the last 168 hours. No results for input(s): "PROBNP" in the last 8760 hours. Coagulation Profile: No results for input(s): "INR", "PROTIME" in the last 168 hours. Thyroid Function Tests: No results for input(s): "TSH", "T4TOTAL", "FREET4", "T3FREE", "THYROIDAB" in the last 72 hours. Lipid Profile: No results for input(s): "CHOL", "HDL", "LDLCALC", "TRIG", "CHOLHDL", "LDLDIRECT" in the last 72 hours. Anemia Panel: No results for input(s): "VITAMINB12", "FOLATE", "FERRITIN", "TIBC", "IRON ", "RETICCTPCT" in the last 72 hours. Urine analysis:    Component Value  Date/Time   COLORURINE YELLOW 04/28/2023 1803   APPEARANCEUR TURBID (A) 04/28/2023 1803   LABSPEC 1.014 04/28/2023 1803   PHURINE 8.0 04/28/2023 1803   GLUCOSEU NEGATIVE 04/28/2023 1803   HGBUR MODERATE (A) 04/28/2023 1803   BILIRUBINUR NEGATIVE 04/28/2023 1803   KETONESUR NEGATIVE 04/28/2023 1803   PROTEINUR 100 (A) 04/28/2023 1803   UROBILINOGEN 0.2 05/09/2013 1857   NITRITE NEGATIVE 04/28/2023 1803   LEUKOCYTESUR MODERATE (A) 04/28/2023 1803   Sepsis Labs: Invalid input(s): "PROCALCITONIN", "LACTICIDVEN"   SIGNED:  Theadore Finger, MD  [facility] 11/12/2023, 2:05 PM

## 2023-11-12 NOTE — Discharge Planning (Signed)
 [facility] Patient Discharge Orders- [facility] CLINIC: [facility]  Patient's name: [name] [name] Admit/DC Dates: 10/20/2023 - 11/12/2023  Discharge Diagnoses: Seizure disorder   Acute right MCA infarct Acute encephalopathy  Aranesp : Given: no   Date and amount of last dose: N/A  Last Hgb: 11.9 PRBC's Given: no Date/# of units: N/A ESA dose for discharge: none IV Iron  dose at discharge: none  Heparin  change: no  EDW Change: Yes New EDW:  107.5kg  [place]ath Change: no  Access intervention/Change: no Details:  Hectorol /Calcitriol  change: o  Discharge Labs: Calcium  9.8 Phosphorus 5.8 Albumin  3.4 K+ 5.2  IV Antibiotics: no Details:  On Coumadin ?: no Last INR: Next INR: Managed By:   OTHER/APPTS/LAB ORDERS: Discharging to SNF    D/C Meds to be reconciled by nurse after every discharge.  Completed By: Ramona Burner, PA-C 11/12/2023, 8:44 PM  [facility] Pager: [phone]    Reviewed by: MD:______ RN_______

## 2023-11-12 NOTE — Progress Notes (Signed)
 [facility] Progress Note  Subjective:  Seen in room No new c/o's  Vitals:   11/11/23 2002 11/12/23 0024 11/12/23 0359 11/12/23 0723  BP: (!) 131/95 (!) 146/92 129/87 (!) 136/95  Pulse: 76 66 69 65  Resp: 16 18 16 18   Temp: 98 F (36.7 C) 98 F (36.7 C) 97.9 F (36.6 C) 97.6 F (36.4 C)  TempSrc: Oral Oral Oral Oral  SpO2: 99% 99% 93%   Weight:      Height:        Exam: General: on RA, alert and responsive verbally  Heart: RRR, no murmurs, rubs or gallops Lungs:CTA bilaterally, respirations unlabored  Abdomen: Soft, non-distended, +BS Extremities: No LE edema bilaterally Dialysis Access:  R internal jugular TDC  Renal-related home meds: Norvasc  10 every day Auryxia  840mg  ac tid Coreg  6.25 bid  Renavite every day Renvela  3200mg  ac tid Others: eliquis , keppra , statin   OP HD: MWF [facility]  4h   B400  110.5kg   RIJ TDC   Heparin  none Mircera: none Rocaltrol  2.5 mcg three times per week Sensipar  150 mg three times per week    Assessment/ Plan: Seizure disorder: EEG was suggestive of moderate diffuse encephalopathy, no seizures noted and MRI brain no acute changes. Is on keppra  and depakote  now. Neuro has signed off. Per pmd.  Acute encephalopathy: doing better, still some deficits per neurology. He was violet and agressive on 5/24, but behavior is better now. EEG showed moderate diffuse encephalopathy. MRI 5/11 showed multiple acute infarcts R > L, per neuro felt to be due to antiphospholipid syndrome w/ failure of low-dose Eliquis . Eliquis  ^'d to 5 mg bid.  ESRD: HD per MWF schedule. HD tomorrow. HTN: bp's very high on admission, since then BP's have been low to normal and not requiring home meds (coreg / norvasc ).  Volume: 3 kg under dry wt, not eating much, cont keep even next hd  Anemia of CKD - Hgb 10-13 now. No ESA is indicated at this time. Secondary hyperparathyroidism - Calcium  controlled. Phos above goal 7-8 range. Continue calcitriol , sensipar ,  renvela .      Larry Poag MD  [facility] 11/12/2023, 10:42 AM  Recent Labs  Lab 11/10/23 0704 11/11/23 0630 11/11/23 1332 11/12/23 0528  HGB 12.4*  --  11.9*  --   ALBUMIN  3.5 3.7  --  3.4*  CALCIUM  10.2 10.5*  --  9.8  PHOS 7.0* 7.9*  --  5.8*  CREATININE 15.39* 17.51*  --  12.85*  K 4.5 4.5  --  5.2*   No results for input(s): "IRON ", "TIBC", "FERRITIN" in the last 168 hours. Inpatient medications:  apixaban   5 mg Oral BID   atorvastatin   40 mg Oral Daily   calcitRIOL   2.5 mcg Oral Q M,W,F-HD   Chlorhexidine  Gluconate Cloth  6 each Topical Q0600   Chlorhexidine  Gluconate Cloth  6 each Topical Q0600   cinacalcet   150 mg Oral Q M,W,F-HD   divalproex   1,000 mg Oral Q12H   feeding supplement (NEPRO CARB STEADY)  237 mL Oral BID BM   levETIRAcetam   500 mg Oral BID   levETIRAcetam   500 mg Oral Q M,W,F-HD   multivitamin  1 tablet Oral QHS   QUEtiapine  25 mg Oral QHS   sevelamer  carbonate  3,200 mg Oral TID WC   sodium chloride  flush  10-40 mL Intracatheter Q12H    haloperidol  lactate, hydrALAZINE , LORazepam , ondansetron  (ZOFRAN ) IV, sodium chloride  flush, trimethobenzamide 

## 2023-11-12 NOTE — Progress Notes (Signed)
 D/C order noted. Contacted [facility] to be advised of pt's d/c to snf today and that pt should resume care tomorrow. Clinic provided snf name as well. HD info placed on AVS.   Lauraine Polite Renal Navigator [phone]

## 2023-11-20 ENCOUNTER — Ambulatory Visit (HOSPITAL_COMMUNITY)
Admission: RE | Admit: 2023-11-20 | Discharge: 2023-11-20 | Disposition: A | Discharge status: Home / Self Care | Source: Ambulatory Visit | Attending: Vascular Surgery | Admitting: Vascular Surgery

## 2023-11-20 ENCOUNTER — Encounter: Payer: Self-pay | Admitting: Vascular Surgery

## 2023-11-20 ENCOUNTER — Ambulatory Visit (HOSPITAL_BASED_OUTPATIENT_CLINIC_OR_DEPARTMENT_OTHER)
Admission: RE | Admit: 2023-11-20 | Discharge: 2023-11-20 | Disposition: A | Discharge status: Home / Self Care | Source: Ambulatory Visit | Attending: Vascular Surgery | Admitting: Vascular Surgery

## 2023-11-20 ENCOUNTER — Ambulatory Visit (INDEPENDENT_AMBULATORY_CARE_PROVIDER_SITE_OTHER): Admitting: Vascular Surgery

## 2023-11-20 VITALS — BP 124/91 | HR 92 | Temp 98.2°F

## 2023-11-20 DIAGNOSIS — N186 End stage renal disease: Secondary | ICD-10-CM

## 2023-11-20 NOTE — Progress Notes (Signed)
 Patient ID: [name] [name], male   DOB: [birth year], 43 y.o.   MRN: [id number]  Reason for Consult: New Patient (Initial Visit) and Follow-up   Referred by Patrick Boor, MD  Subjective:     HPI:  [name] [name] is a 43 y.o. male has a history of multiple left upper extremity dialysis access procedures including 2 fistulas and subsequent graft.  This is now thrombosed he is on dialysis via catheter.  He is here to discuss further options moving forward.  He is right-hand-dominant has never had any access procedures on the right.  He is currently on dialysis Mondays Wednesdays and Fridays and is also on Eliquis .  Past Medical History:  Diagnosis Date   ESRD on hemodialysis (HCC)    Hypertension    Lupus    Seizures (HCC)    Seizures (HCC)    Stroke (HCC)    87 or 43 years of age.     Wears glasses    Family History  Problem Relation Age of Onset   Diabetes Mother    Diabetes Maternal Grandmother    Seizures Neg Hx        not on mother's side. he doesn't know about his father's side   Past Surgical History:  Procedure Laterality Date   AV FISTULA PLACEMENT Left 12/30/2017   Procedure: ARTERIOVENOUS (AV) FISTULA CREATION VERSUS INSERTION OF ARTERIOVENOUS GRAFT LEFT ARM;  Surgeon: Mayo Speck, MD;  Location: [facility] OR;  Service: Vascular;  Laterality: Left;   BASCILIC VEIN TRANSPOSITION Left 08/15/2018   Procedure: LEFT FIRST STAGE BASCILIC VEIN TRANSPOSITION;  Surgeon: Mayo Speck, MD;  Location: [facility] OR;  Service: Vascular;  Laterality: Left;   BASCILIC VEIN TRANSPOSITION Left 10/30/2018   Procedure: INSERTION OF GORE-TEX GRAFT LEFT UPPER ARM;  Surgeon: Adine Hoof, MD;  Location: [facility] OR;  Service: Vascular;  Laterality: Left;   INSERTION OF DIALYSIS CATHETER N/A 09/20/2018   Procedure: INSERTION OF TUNNELED DIALYSIS CATHETER RIGHT INTERNAL JUGULAR;  Surgeon: Young Hensen, MD;  Location: [facility] OR;  Service: Vascular;  Laterality: N/A;   IR FLUORO GUIDE CV LINE RIGHT   07/16/2022   IR US  GUIDE VASC ACCESS RIGHT  07/16/2022   RENAL BIOPSY      Short Social History:  Social History   Tobacco Use   Smoking status: Some Days    Types: E-cigarettes   Smokeless tobacco: Never  Substance Use Topics   Alcohol use: Not Currently    Allergies  Allergen Reactions   Chlorhexidine  Itching   Cetirizine & Related Swelling    Current Outpatient Medications  Medication Sig Dispense Refill   acetaminophen  (TYLENOL ) 325 MG tablet Take 2 tablets (650 mg total) by mouth every 6 (six) hours as needed for mild pain.     apixaban  (ELIQUIS ) 5 MG TABS tablet Take 1 tablet (5 mg total) by mouth 2 (two) times daily.     atorvastatin  (LIPITOR) 40 MG tablet Take 1 tablet (40 mg total) by mouth daily. 90 tablet 0   AURYXIA  1 GM 210 MG(Fe) tablet Take 840 mg by mouth 3 (three) times daily.     calcitRIOL  (ROCALTROL ) 0.5 MCG capsule Take 5 capsules (2.5 mcg total) by mouth every Monday, Wednesday, and Friday with hemodialysis.     cinacalcet  (SENSIPAR ) 30 MG tablet Take 5 tablets (150 mg total) by mouth every Monday, Wednesday, and Friday with hemodialysis.     divalproex  (DEPAKOTE ) 500 MG DR tablet Take 2 tablets (1,000 mg  total) by mouth every 12 (twelve) hours.     levETIRAcetam  (KEPPRA ) 500 MG tablet Take 1 tablet (500 mg total) by mouth 2 (two) times daily.     levETIRAcetam  (KEPPRA ) 500 MG tablet Take 1 tablet (500 mg total) by mouth every Monday, Wednesday, and Friday with hemodialysis.     multivitamin (RENA-VIT) TABS tablet Take 1 tablet by mouth daily.     Nutritional Supplements (FEEDING SUPPLEMENT, NEPRO CARB STEADY,) LIQD Take 237 mLs by mouth 2 (two) times daily between meals.     QUEtiapine  (SEROQUEL ) 25 MG tablet Take 1 tablet (25 mg total) by mouth at bedtime.     sevelamer  carbonate (RENVELA ) 800 MG tablet Take 3,200 mg by mouth 3 (three) times daily with meals.     No current facility-administered medications for this visit.    Review of Systems   Constitutional:  Constitutional negative. HENT: HENT negative.  Eyes: Eyes negative.  Respiratory: Respiratory negative.  Cardiovascular: Cardiovascular negative.  GI: Gastrointestinal negative.  Musculoskeletal: Musculoskeletal negative.  Skin: Skin negative.  Neurological: Neurological negative. Hematologic: Hematologic/lymphatic negative.  Psychiatric: Psychiatric negative.        Objective:  Objective   Vitals:   11/20/23 1455  BP: (!) 124/91  Pulse: 92  Temp: 98.2 F (36.8 C)  SpO2: 94%   There is no height or weight on file to calculate BMI.  Physical Exam HENT:     Head: Normocephalic.     Nose: Nose normal.  Eyes:     Pupils: Pupils are equal, round, and reactive to light.  Cardiovascular:     Rate and Rhythm: Normal rate.     Pulses:          Radial pulses are 2+ on the right side and 2+ on the left side.  Abdominal:     General: Abdomen is flat.  Musculoskeletal:        General: Normal range of motion.  Skin:    General: Skin is warm.     Capillary Refill: Capillary refill takes less than 2 seconds.  Neurological:     General: No focal deficit present.     Mental Status: He is alert.  Psychiatric:        Mood and Affect: Mood normal.        Thought Content: Thought content normal.        Judgment: Judgment normal.     Data: UPPER EXTREMITY VEIN MAPPING  Patient Name:  [name] [name]  Date of Exam:   11/20/2023 Medical Rec #: [id number]       Accession #:    [id number] Date of Birth: 1981-03-31      Patient Gender: M Patient Age:   38 years Exam Location:  [address] Procedure:      VAS US  UPPER EXT VEIN MAPPING (PRE-OP  AVF) Referring Phys: Angela Kell   --------------------------------------------------------------------------- -----   Indications: Pre-access.  Comparison Study: 05/01/18  Performing Technologist: Estanislao Heimlich    Examination Guidelines: A complete evaluation includes B-mode imaging, spectral Doppler, color  Doppler, and power Doppler as needed of all accessible portions of each vessel. Bilateral testing is considered an integral part of a complete examination. Limited examinations for reoccurring indications may be performed as noted.  +-----------------+-------------+----------+---------+ Right Cephalic   Diameter (cm)Depth (cm)Findings  +-----------------+-------------+----------+---------+ Shoulder             0.18        1.26             +-----------------+-------------+----------+---------+  Prox upper arm       0.15        0.95             +-----------------+-------------+----------+---------+ Mid upper arm        0.13        0.43             +-----------------+-------------+----------+---------+ Dist upper arm       0.13        0.48             +-----------------+-------------+----------+---------+ Antecubital fossa    0.13        0.27   branching +-----------------+-------------+----------+---------+ Prox forearm         0.12        0.60             +-----------------+-------------+----------+---------+ Mid forearm          0.18        0.46             +-----------------+-------------+----------+---------+ Wrist                0.15        0.47             +-----------------+-------------+----------+---------+  +-----------------+-------------+----------+--------------+ Right Basilic    Diameter (cm)Depth (cm)   Findings    +-----------------+-------------+----------+--------------+ Shoulder                                not visualized +-----------------+-------------+----------+--------------+ Prox upper arm                          not visualized +-----------------+-------------+----------+--------------+ Mid upper arm        0.43        1.30                  +-----------------+-------------+----------+--------------+ Dist upper arm       0.60        0.60     branching     +-----------------+-------------+----------+--------------+ Antecubital fossa    0.22        0.36                  +-----------------+-------------+----------+--------------+ Prox forearm         0.22        0.34                  +-----------------+-------------+----------+--------------+ Mid forearm                             not visualized +-----------------+-------------+----------+--------------+ Wrist                                   not visualized +-----------------+-------------+----------+--------------+  +-----------------+-------------+----------+--------------+ Left Cephalic    Diameter (cm)Depth (cm)   Findings    +-----------------+-------------+----------+--------------+ Shoulder             0.29        0.96                  +-----------------+-------------+----------+--------------+ Prox upper arm       0.30        1.11                  +-----------------+-------------+----------+--------------+ Mid upper arm  0.22        0.75                  +-----------------+-------------+----------+--------------+ Dist upper arm                            thrombosed   +-----------------+-------------+----------+--------------+ Antecubital fossa                         thrombosed   +-----------------+-------------+----------+--------------+ Prox forearm                              thrombosed   +-----------------+-------------+----------+--------------+ Mid forearm                             not visualized +-----------------+-------------+----------+--------------+ Dist forearm                            not visualized +-----------------+-------------+----------+--------------+ Wrist                                   not visualized +-----------------+-------------+----------+--------------+  +-----------------+-------------+----------+--------------+ Left Basilic     Diameter (cm)Depth (cm)    Findings    +-----------------+-------------+----------+--------------+ Shoulder                                not visualized +-----------------+-------------+----------+--------------+ Prox upper arm                          not visualized +-----------------+-------------+----------+--------------+ Mid upper arm                            Previous AVF  +-----------------+-------------+----------+--------------+ Dist upper arm                           Previous AVF  +-----------------+-------------+----------+--------------+ Antecubital fossa                        Previous AVF  +-----------------+-------------+----------+--------------+ Prox forearm                            not visualized +-----------------+-------------+----------+--------------+ Mid forearm                             not visualized +-----------------+-------------+----------+--------------+ Wrist                                   not visualized +-----------------+-------------+----------+--------------+       Assessment/Plan:     43 year old male with end-stage renal disease on dialysis via catheter.  He has exhausted left upper extremity access options and we will plan to move to the right.  We discussed the risk benefits alternatives and he demonstrates good understanding.  He will need to hold Eliquis  for 48 hours prior to procedure.  We will get the scheduled on a nondialysis day in the near future.     Adine Hoof MD [facility]

## 2023-11-21 ENCOUNTER — Telehealth: Payer: Self-pay

## 2023-11-21 NOTE — Telephone Encounter (Signed)
 Attempted to call for surgery scheduling. Unable to LVM - mailbox is full

## 2023-11-25 ENCOUNTER — Telehealth: Payer: Self-pay

## 2023-11-25 NOTE — Telephone Encounter (Signed)
 Patient currently residing at [facility] for rehab.  Left message with scheduling for surgery with Dr. Vikki Graves.

## 2023-11-29 ENCOUNTER — Telehealth: Payer: Self-pay

## 2023-11-29 ENCOUNTER — Other Ambulatory Visit: Payer: Self-pay

## 2023-11-29 DIAGNOSIS — N186 End stage renal disease: Secondary | ICD-10-CM

## 2023-11-29 NOTE — Telephone Encounter (Signed)
 Patient currently resident at [facility].  For transportation/appts, call Tammy at [phone] Fax at nurse's station: [phone]

## 2023-12-19 ENCOUNTER — Encounter (HOSPITAL_COMMUNITY): Payer: Self-pay | Admitting: Vascular Surgery

## 2023-12-19 ENCOUNTER — Other Ambulatory Visit: Payer: Self-pay

## 2023-12-19 NOTE — Progress Notes (Signed)
 SDW call  Patient is a resident at [facility], [phone]. Spoke with Randine, RN. She was given pre-op  instructions over the phone. A copy of the instructions were faxed to [phone] and [phone].     PCP - Haze Servant, NP Cardiologist -  Pulmonary:    PPM/ICD - denies Device Orders - na Rep Notified - na   Chest x-ray - 08/02/2023 EKG -  10/21/2023 Stress Test -  ECHO - 10/28/2023 Cardiac Cath -   Sleep Study/sleep apnea/CPAP: denies  Non-diabetic  Blood Thinner Instructions: Eliquis , hold 2 days, last day should be 12/21/2023 Aspirin  Instructions:denies   ERAS Protcol - NPO   Anesthesia review: Yes. HTN, stroke, ESRD on dialysis, seizures, lupus.  Facility did not know last seizure. They had limited information as their computers were down and only a few phones were working.

## 2023-12-19 NOTE — Progress Notes (Addendum)
 SDW Surgical Instructions for [facility]   Your procedure is scheduled on Tuesday December 24, 2023. Report to [facility] Main Entrance A at 5:30 A.M., then check in with the Admitting office. Any questions or running late day of surgery: call [phone]  Questions prior to your surgery date: call [phone], Monday-Friday, 8am-4pm. If you experience any cold or flu symptoms such as cough, fever, chills, shortness of breath, etc. between now and your scheduled surgery, please notify us  at the above number.     Remember:  Do not eat or drink after midnight the night before your surgery  Take these medicines the morning of surgery with A SIP OF WATER   atorvastatin  (LIPITOR)  divalproex  (DEPAKOTE )  levETIRAcetam  (KEPPRA )   May take these medicines IF NEEDED: acetaminophen  (TYLENOL )   PER YOUR SURGEON'S INSTRUCTIONS DO NOT TAKE YOUR apixaban  (ELIQUIS )  2 DAYS PRIOR TO SURGERY WITH THE LAST DOSE BEING 12/21/2023.   One week prior to surgery, STOP taking any Aspirin  (unless otherwise instructed by your surgeon) Aleve, Naproxen, Ibuprofen, Motrin, Advil, Goody's, BC's, all herbal medications, fish oil, and non-prescription vitamins.                     Do NOT Smoke (Tobacco/Vaping) for 24 hours prior to your procedure.  If you use a CPAP at night, you may bring your mask/headgear for your overnight stay.   You will be asked to remove any contacts, glasses, piercing's, hearing aid's, dentures/partials prior to surgery. Please bring cases for these items if needed.    Patients discharged the day of surgery will not be allowed to drive home, and someone needs to stay with them for 24 hours.  SURGICAL WAITING ROOM VISITATION Patients may have no more than 2 support people in the waiting area - these visitors may rotate.   Pre-op  nurse will coordinate an appropriate time for 1 ADULT support person, who may not rotate, to accompany patient in pre-op .  Children under the age of 57  must have an adult with them who is not the patient and must remain in the main waiting area with an adult.  If the patient needs to stay at the hospital during part of their recovery, the visitor guidelines for inpatient rooms apply.  Please refer to the [facility] website for the visitor guidelines for any additional information.   If you received a COVID test during your pre-op  visit  it is requested that you wear a mask when out in public, stay away from anyone that may not be feeling well and notify your surgeon if you develop symptoms. If you have been in contact with anyone that has tested positive in the last 10 days please notify you surgeon.      Pre-operative Dial  Soap Bathing Instructions   You can play a key role in reducing the risk of infection after surgery. Your skin needs to be as free of germs as possible. You can reduce the number of germs on your skin by washing with Dial  soap before surgery.              TAKE A SHOWER THE NIGHT BEFORE SURGERY AND THE DAY OF SURGERY    Please keep in mind the following:  You may shave your face before/day of surgery.  Place clean sheets on your bed the night before surgery Use a clean washcloth (not used since being washed) for each shower.  Shower Instructions:  Oncologist and private area  with normal soap. If you choose to wash your hair, wash first with your normal shampoo.  After you use shampoo/soap, rinse your hair and body thoroughly to remove shampoo/soap residue.  3.   Pat dry with a clean towel  4.   Put on clean pajamas    Additional instructions for the day of surgery: DO NOT APPLY any lotions, deodorants or cologne.   Do not wear jewelry Do not bring valuables to the hospital. [facility] is not responsible for valuables/personal belongings. Put on clean/comfortable clothes.  Please brush your teeth.  Ask your nurse before applying any prescription medications to the skin.

## 2023-12-23 NOTE — Progress Notes (Signed)
 Anesthesia Chart Review: Same day workup  43 year old male history of ESRD on HD MWF, HTN, CVA, seizures, antiphospholipid syndrome  Recent admission 5/4 through 11/12/2023.  He was  brought to the ER on 5/5 after patient had a seizure, encephalopathy. Neurology was consulted and started on Keppra  and Depakote . MRI on 5/6 showed no evidence of acute intracranial abnormality, and multiple remote infarcts. Patient remained encephalopathic. Patient had breakthrough seizure on 5/9 while he on HD.  EEG is suggestive of moderate diffuse encephalopathy.  Sharp transients were noted in the right frontal region. MRI obtained and showed Acute right MCA patchy infarct/ left thalamic and caudate head punctuate infarct. MRA head, carotid Dopplers without significant finding.  TTE showed LVEF 65 to 70%, grade 1 DD, normal RV, rheumatic mitral valve with mild regurgitation and moderate stenosis (mean gradient 5 mmHg, stable from prior echo 01/2023.  Neurology recommended increasing home Eliquis  from 2.5 mg twice daily to 5 mg twice daily.  He was put on Keppra  500 mg twice daily and 500 mg MWF after HD and Depakote  1000 mg every 12 hours for seizure prophylaxis.  He was discharged to SNF.  He has a history of multiple left upper extremity dialysis access procedures including 2 fistulas and subsequent graft.  This is now thrombosed and he is on dialysis via catheter.  He was instructed by vascular surgery to hold Eliquis  48 hours prior to procedure.  Patient will need day of surgery labs and evaluation by assigned anesthesiologist.  EKG 10/20/2023: Sinus rhythm.  Rate 80.  Probable LAE.  LAD.  RSR' in V1 or V2, right VCD or RVH.  Prolonged QT interval (QTc 512)  TTE 10/28/2023:  1. Left ventricular ejection fraction, by estimation, is 65 to 70%. The  left ventricle has normal function. The left ventricle has no regional  wall motion abnormalities. There is moderate concentric left ventricular  hypertrophy. Left  ventricular  diastolic parameters are consistent with Grade I diastolic dysfunction  (impaired relaxation).   2. Right ventricular systolic function is normal. The right ventricular  size is normal. Tricuspid regurgitation signal is inadequate for assessing  PA pressure.   3. Left atrial size was mildly dilated.   4. The mitral valve is calcified and restricted, possible rheumatic  disease. Mild mitral valve regurgitation. Moderate mitral stenosis with  mean gradient 5 mmHg, MVA 1.28 cm^2 by VTI and 1.38 cm^2 by PHT.   5. The aortic valve is tricuspid. There is mild calcification of the  aortic valve. Aortic valve regurgitation is not visualized. No aortic  stenosis is present.   6. The inferior vena cava is normal in size with greater than 50%  respiratory variability, suggesting right atrial pressure of 3 mmHg.   7. Bubble study was unsuccessful.       [name] [name] [facility] Stay [facility]/Anesthesiology Phone [phone] 12/23/2023 10:49 AM

## 2023-12-23 NOTE — Anesthesia Preprocedure Evaluation (Addendum)
 Anesthesia Evaluation  Patient identified by MRN, date of birth, ID band Patient awake    Reviewed: Allergy & Precautions, H&P , NPO status , Patient's Chart, lab work & pertinent test results  Airway Mallampati: II  TM Distance: >3 FB Neck ROM: Full    Dental no notable dental hx.    Pulmonary former smoker   Pulmonary exam normal breath sounds clear to auscultation       Cardiovascular hypertension, Normal cardiovascular exam+ Valvular Problems/Murmurs  Rhythm:Regular Rate:Normal  Moderate MS   Neuro/Psych Seizures -,  CVA  negative psych ROS   GI/Hepatic negative GI ROS, Neg liver ROS,,,  Endo/Other  negative endocrine ROS    Renal/GU ESRF and DialysisRenal disease  negative genitourinary   Musculoskeletal negative musculoskeletal ROS (+)    Abdominal   Peds negative pediatric ROS (+)  Hematology  (+) Blood dyscrasia, anemia antiphospholipid syndrome   Anesthesia Other Findings lupus  Reproductive/Obstetrics negative OB ROS                              Anesthesia Physical Anesthesia Plan  ASA: 4  Anesthesia Plan: Regional and MAC   Post-op Pain Management: Tylenol  PO (pre-op )* and Regional block*   Induction: Intravenous  PONV Risk Score and Plan: 2 and Ondansetron , Dexamethasone  and Treatment may vary due to age or medical condition  Airway Management Planned: Natural Airway and Simple Face Mask  Additional Equipment:   Intra-op Plan:   Post-operative Plan:   Informed Consent: I have reviewed the patients History and Physical, chart, labs and discussed the procedure including the risks, benefits and alternatives for the proposed anesthesia with the patient or authorized representative who has indicated his/her understanding and acceptance.     Dental advisory given  Plan Discussed with: CRNA  Anesthesia Plan Comments: (PAT note by Lynwood Hope, PA-C: 43 year old  male history of ESRD on HD MWF, HTN, CVA, seizures, antiphospholipid syndrome  Recent admission 5/4 through 11/12/2023.  He was  brought to the ER on 5/5 after patient had a seizure, encephalopathy. Neurology was consulted and started on Keppra  and Depakote . MRI on 5/6 showed no evidence of acute intracranial abnormality, and multiple remote infarcts. Patient remained encephalopathic. Patient had breakthrough seizure on 5/9 while he on HD.  EEGis suggestive of moderate diffuse encephalopathy.  Sharp transients were noted in the right frontal region. MRI obtained and showed Acute right MCA patchy infarct/ left thalamic and caudate head punctuate infarct. MRA head, carotid Dopplers without significant finding.  TTE showed LVEF 65 to 70%, grade 1 DD, normal RV, rheumatic mitral valve with mild regurgitation and moderate stenosis (mean gradient 5 mmHg, stable from prior echo 01/2023.  Neurology recommended increasing home Eliquis  from 2.5 mg twice daily to 5 mg twice daily.  He was put on Keppra  500 mg twice daily and 500 mg MWF after HD and Depakote  1000 mg every 12 hours for seizure prophylaxis.  He was discharged to SNF.  He has a history of multiple left upper extremity dialysis access procedures including 2 fistulas and subsequent graft.  This is now thrombosed and he is on dialysis via catheter.  He was instructed by vascular surgery to hold Eliquis  48 hours prior to procedure.  Patient will need day of surgery labs and evaluation by assigned anesthesiologist.  EKG 10/20/2023: Sinus rhythm.  Rate 80.  Probable LAE.  LAD.  RSR' in V1 or V2, right VCD or RVH.  Prolonged QT  interval (QTc 512)  TTE 10/28/2023: 1. Left ventricular ejection fraction, by estimation, is 65 to 70%. The  left ventricle has normal function. The left ventricle has no regional  wall motion abnormalities. There is moderate concentric left ventricular  hypertrophy. Left ventricular  diastolic parameters are consistent with Grade I  diastolic dysfunction  (impaired relaxation).  2. Right ventricular systolic function is normal. The right ventricular  size is normal. Tricuspid regurgitation signal is inadequate for assessing  PA pressure.  3. Left atrial size was mildly dilated.  4. The mitral valve is calcified and restricted, possible rheumatic  disease. Mild mitral valve regurgitation. Moderate mitral stenosis with  mean gradient 5 mmHg, MVA 1.28 cm^2 by VTI and 1.38 cm^2 by PHT.  5. The aortic valve is tricuspid. There is mild calcification of the  aortic valve. Aortic valve regurgitation is not visualized. No aortic  stenosis is present.  6. The inferior vena cava is normal in size with greater than 50%  respiratory variability, suggesting right atrial pressure of 3 mmHg.  7. Bubble study was unsuccessful.  )         Anesthesia Quick Evaluation

## 2023-12-24 ENCOUNTER — Encounter (HOSPITAL_COMMUNITY)
Admission: RE | Disposition: A | Discharge status: Home / Self Care | Payer: Self-pay | Source: Home / Self Care | Attending: Vascular Surgery

## 2023-12-24 ENCOUNTER — Ambulatory Visit (HOSPITAL_COMMUNITY)
Admission: RE | Admit: 2023-12-24 | Discharge: 2023-12-24 | Disposition: A | Discharge status: Home / Self Care | Attending: Vascular Surgery | Admitting: Vascular Surgery

## 2023-12-24 ENCOUNTER — Other Ambulatory Visit (HOSPITAL_COMMUNITY): Payer: Self-pay

## 2023-12-24 ENCOUNTER — Ambulatory Visit (HOSPITAL_BASED_OUTPATIENT_CLINIC_OR_DEPARTMENT_OTHER): Admitting: Physician Assistant

## 2023-12-24 ENCOUNTER — Other Ambulatory Visit: Payer: Self-pay

## 2023-12-24 ENCOUNTER — Encounter (HOSPITAL_COMMUNITY): Admitting: Physician Assistant

## 2023-12-24 DIAGNOSIS — N186 End stage renal disease: Secondary | ICD-10-CM

## 2023-12-24 DIAGNOSIS — I120 Hypertensive chronic kidney disease with stage 5 chronic kidney disease or end stage renal disease: Secondary | ICD-10-CM | POA: Diagnosis not present

## 2023-12-24 DIAGNOSIS — Z992 Dependence on renal dialysis: Secondary | ICD-10-CM | POA: Diagnosis not present

## 2023-12-24 DIAGNOSIS — Z87891 Personal history of nicotine dependence: Secondary | ICD-10-CM

## 2023-12-24 DIAGNOSIS — F17290 Nicotine dependence, other tobacco product, uncomplicated: Secondary | ICD-10-CM | POA: Diagnosis not present

## 2023-12-24 DIAGNOSIS — Z7901 Long term (current) use of anticoagulants: Secondary | ICD-10-CM | POA: Diagnosis not present

## 2023-12-24 DIAGNOSIS — M329 Systemic lupus erythematosus, unspecified: Secondary | ICD-10-CM | POA: Insufficient documentation

## 2023-12-24 HISTORY — PX: AV FISTULA PLACEMENT: SHX1204

## 2023-12-24 LAB — POCT I-STAT, CHEM 8
BUN: 54 mg/dL — ABNORMAL HIGH (ref 6–20)
Calcium, Ion: 1.07 mmol/L — ABNORMAL LOW (ref 1.15–1.40)
Chloride: 98 mmol/L (ref 98–111)
Creatinine, Ser: 10.7 mg/dL — ABNORMAL HIGH (ref 0.61–1.24)
Glucose, Bld: 70 mg/dL (ref 70–99)
HCT: 33 % — ABNORMAL LOW (ref 39.0–52.0)
Hemoglobin: 11.2 g/dL — ABNORMAL LOW (ref 13.0–17.0)
Potassium: 4.5 mmol/L (ref 3.5–5.1)
Sodium: 136 mmol/L (ref 135–145)
TCO2: 29 mmol/L (ref 22–32)

## 2023-12-24 LAB — SURGICAL PCR SCREEN
MRSA, PCR: NEGATIVE
Staphylococcus aureus: NEGATIVE

## 2023-12-24 SURGERY — ARTERIOVENOUS (AV) FISTULA CREATION
Anesthesia: Monitor Anesthesia Care | Laterality: Right

## 2023-12-24 MED ORDER — PROPOFOL 500 MG/50ML IV EMUL
INTRAVENOUS | Status: DC | PRN
  Administered 2023-12-24: 115 ug/kg/min via INTRAVENOUS

## 2023-12-24 MED ORDER — ACETAMINOPHEN 10 MG/ML IV SOLN: 1000.0000 mg | Freq: Once | INTRAVENOUS | Status: DC | PRN

## 2023-12-24 MED ORDER — MIDAZOLAM HCL 2 MG/2ML IJ SOLN
INTRAMUSCULAR | Status: DC | PRN
  Administered 2023-12-24 (×2): 1 mg via INTRAVENOUS

## 2023-12-24 MED ORDER — 0.9 % SODIUM CHLORIDE (POUR BTL) OPTIME
TOPICAL | Status: DC | PRN
  Administered 2023-12-24: 1000 mL

## 2023-12-24 MED ORDER — PROPOFOL 1000 MG/100ML IV EMUL
INTRAVENOUS | Status: AC
  Filled 2023-12-24: qty 100

## 2023-12-24 MED ORDER — HEPARIN 6000 UNIT IRRIGATION SOLUTION
Status: DC | PRN
  Administered 2023-12-24: 1

## 2023-12-24 MED ORDER — OXYCODONE HCL 5 MG/5ML PO SOLN: 5.0000 mg | Freq: Once | ORAL | Status: DC | PRN

## 2023-12-24 MED ORDER — PHENYLEPHRINE HCL-NACL 20-0.9 MG/250ML-% IV SOLN
INTRAVENOUS | Status: DC | PRN
Start: 2023-12-24 — End: 2023-12-24
  Administered 2023-12-24: 40 ug/min via INTRAVENOUS
  Administered 2023-12-24: 80 ug via INTRAVENOUS

## 2023-12-24 MED ORDER — FENTANYL CITRATE (PF) 250 MCG/5ML IJ SOLN
INTRAMUSCULAR | Status: AC
  Filled 2023-12-24: qty 5

## 2023-12-24 MED ORDER — NITROGLYCERIN IN D5W 200-5 MCG/ML-% IV SOLN
INTRAVENOUS | Status: AC
  Filled 2023-12-24: qty 250

## 2023-12-24 MED ORDER — SODIUM CHLORIDE 0.9 % IV SOLN: INTRAVENOUS | Status: DC | PRN

## 2023-12-24 MED ORDER — CHLORHEXIDINE GLUCONATE 0.12 % MT SOLN
15.0000 mL | Freq: Once | OROMUCOSAL | Status: AC
  Filled 2023-12-24: qty 15

## 2023-12-24 MED ORDER — OXYCODONE-ACETAMINOPHEN 5-325 MG PO TABS
1.0000 | ORAL_TABLET | Freq: Four times a day (QID) | ORAL | 0 refills | Status: DC | PRN
  Filled 2023-12-24: qty 8, 2d supply, fill #0

## 2023-12-24 MED ORDER — ORAL CARE MOUTH RINSE
15.0000 mL | Freq: Once | OROMUCOSAL | Status: AC
  Administered 2023-12-24: 15 mL via OROMUCOSAL

## 2023-12-24 MED ORDER — SODIUM CHLORIDE 0.9% FLUSH: 3.0000 mL | INTRAVENOUS | Status: DC | PRN

## 2023-12-24 MED ORDER — MIDAZOLAM HCL 2 MG/2ML IJ SOLN
INTRAMUSCULAR | Status: AC
  Filled 2023-12-24: qty 2

## 2023-12-24 MED ORDER — PHENYLEPHRINE HCL-NACL 20-0.9 MG/250ML-% IV SOLN
INTRAVENOUS | Status: AC
  Filled 2023-12-24: qty 750

## 2023-12-24 MED ORDER — DEXMEDETOMIDINE HCL IN NACL 80 MCG/20ML IV SOLN
INTRAVENOUS | Status: DC | PRN
  Administered 2023-12-24: 4 ug via INTRAVENOUS
  Administered 2023-12-24: 8 ug via INTRAVENOUS

## 2023-12-24 MED ORDER — PROPOFOL 500 MG/50ML IV EMUL
INTRAVENOUS | Status: AC
  Filled 2023-12-24: qty 150

## 2023-12-24 MED ORDER — HEMOSTATIC AGENTS (NO CHARGE) OPTIME
TOPICAL | Status: DC | PRN
Start: 2023-12-24 — End: 2023-12-24
  Administered 2023-12-24: 1 via TOPICAL

## 2023-12-24 MED ORDER — CEFAZOLIN SODIUM-DEXTROSE 2-4 GM/100ML-% IV SOLN
2.0000 g | INTRAVENOUS | Status: AC
  Administered 2023-12-24: 2 g via INTRAVENOUS
  Filled 2023-12-24: qty 100

## 2023-12-24 MED ORDER — DROPERIDOL 2.5 MG/ML IJ SOLN: 0.6250 mg | Freq: Once | INTRAMUSCULAR | Status: DC | PRN

## 2023-12-24 MED ORDER — ONDANSETRON HCL 4 MG/2ML IJ SOLN
INTRAMUSCULAR | Status: DC | PRN
  Administered 2023-12-24: 4 mg via INTRAVENOUS

## 2023-12-24 MED ORDER — ROPIVACAINE HCL 5 MG/ML IJ SOLN
INTRAMUSCULAR | Status: DC | PRN
  Administered 2023-12-24: 25 mL via PERINEURAL

## 2023-12-24 MED ORDER — FENTANYL CITRATE (PF) 250 MCG/5ML IJ SOLN
INTRAMUSCULAR | Status: DC | PRN
  Administered 2023-12-24 (×2): 50 ug via INTRAVENOUS

## 2023-12-24 MED ORDER — FENTANYL CITRATE (PF) 100 MCG/2ML IJ SOLN: 25.0000 ug | INTRAMUSCULAR | Status: DC | PRN

## 2023-12-24 MED ORDER — ONDANSETRON HCL 4 MG/2ML IJ SOLN
INTRAMUSCULAR | Status: AC
  Filled 2023-12-24: qty 2

## 2023-12-24 MED ORDER — LIDOCAINE 2% (20 MG/ML) 5 ML SYRINGE
INTRAMUSCULAR | Status: AC
  Filled 2023-12-24: qty 5

## 2023-12-24 MED ORDER — HEPARIN SODIUM (PORCINE) 1000 UNIT/ML IJ SOLN
INTRAMUSCULAR | Status: AC
Start: 2023-12-24 — End: 2023-12-24
  Filled 2023-12-24: qty 10

## 2023-12-24 MED ORDER — ACETAMINOPHEN 500 MG PO TABS
1000.0000 mg | ORAL_TABLET | Freq: Once | ORAL | Status: AC
  Administered 2023-12-24: 1000 mg via ORAL
  Filled 2023-12-24: qty 2

## 2023-12-24 MED ORDER — DEXAMETHASONE SODIUM PHOSPHATE 10 MG/ML IJ SOLN
INTRAMUSCULAR | Status: DC | PRN
  Administered 2023-12-24: 5 mg via INTRAVENOUS

## 2023-12-24 MED ORDER — HEPARIN 6000 UNIT IRRIGATION SOLUTION
Status: AC
  Filled 2023-12-24: qty 500

## 2023-12-24 MED ORDER — DEXAMETHASONE SODIUM PHOSPHATE 10 MG/ML IJ SOLN
INTRAMUSCULAR | Status: AC
  Filled 2023-12-24: qty 1

## 2023-12-24 MED ORDER — OXYCODONE HCL 5 MG PO TABS: 5.0000 mg | ORAL_TABLET | Freq: Once | ORAL | Status: DC | PRN

## 2023-12-24 SURGICAL SUPPLY — 26 items
ARMBAND PINK RESTRICT EXTREMIT (MISCELLANEOUS) ×1 IMPLANT
BAG COUNTER SPONGE SURGICOUNT (BAG) ×1 IMPLANT
CANISTER SUCTION 3000ML PPV (SUCTIONS) ×1 IMPLANT
CLIP LIGATING EXTRA MED SLVR (CLIP) ×1 IMPLANT
CLIP LIGATING EXTRA SM BLUE (MISCELLANEOUS) ×1 IMPLANT
COVER PROBE W GEL 5X96 (DRAPES) IMPLANT
DERMABOND ADVANCED .7 DNX12 (GAUZE/BANDAGES/DRESSINGS) ×1 IMPLANT
ELECTRODE REM PT RTRN 9FT ADLT (ELECTROSURGICAL) ×1 IMPLANT
GLOVE BIO SURGEON STRL SZ7.5 (GLOVE) ×1 IMPLANT
GOWN STRL REUS W/ TWL LRG LVL3 (GOWN DISPOSABLE) ×2 IMPLANT
GOWN STRL REUS W/ TWL XL LVL3 (GOWN DISPOSABLE) ×1 IMPLANT
INSERT FOGARTY SM (MISCELLANEOUS) IMPLANT
KIT BASIN OR (CUSTOM PROCEDURE TRAY) ×1 IMPLANT
KIT TURNOVER KIT B (KITS) ×1 IMPLANT
LOOP VESSEL MINI RED (MISCELLANEOUS) IMPLANT
NS IRRIG 1000ML POUR BTL (IV SOLUTION) ×1 IMPLANT
PACK CV ACCESS (CUSTOM PROCEDURE TRAY) ×1 IMPLANT
PAD ARMBOARD POSITIONER FOAM (MISCELLANEOUS) ×2 IMPLANT
POWDER SURGICEL 3.0 GRAM (HEMOSTASIS) IMPLANT
SLING ARM FOAM STRAP LRG (SOFTGOODS) IMPLANT
SUT MNCRL AB 4-0 PS2 18 (SUTURE) ×1 IMPLANT
SUT PROLENE 6 0 BV (SUTURE) ×1 IMPLANT
SUT VIC AB 3-0 SH 27X BRD (SUTURE) ×1 IMPLANT
TOWEL GREEN STERILE (TOWEL DISPOSABLE) ×1 IMPLANT
UNDERPAD 30X36 HEAVY ABSORB (UNDERPADS AND DIAPERS) ×1 IMPLANT
WATER STERILE IRR 1000ML POUR (IV SOLUTION) ×1 IMPLANT

## 2023-12-24 NOTE — Progress Notes (Signed)
 Orthopedic Tech Progress Note Patient Details:  [name] [name] [birth year] [id number]  Ortho Devices Type of Ortho Device: Arm sling Ortho Device/Splint Location: RUE Ortho Device/Splint Interventions: Ordered, Application, AdjustmentPACU RN called requesting a larger sling than what patient had on. I brought down a XL instead of a large   Post Interventions Patient Tolerated: Well Instructions Provided: Care of device  Delanna LITTIE Pac 12/24/2023, 10:29 AM

## 2023-12-24 NOTE — Anesthesia Procedure Notes (Signed)
 Anesthesia Regional Block: Supraclavicular block   Pre-Anesthetic Checklist: , timeout performed,  Correct Patient, Correct Site, Correct Laterality,  Correct Procedure, Correct Position, site marked,  Risks and benefits discussed,  Surgical consent,  Pre-op  evaluation,  At surgeon's request and post-op pain management  Laterality: Right  Prep: chloraprep       Needles:  Injection technique: Single-shot  Needle Type: Echogenic Stimulator Needle     Needle Length: 9cm  Needle Gauge: 21     Additional Needles:   Procedures:,,,, ultrasound used (permanent image in chart),,    Narrative:  Start time: 12/24/2023 7:10 AM End time: 12/24/2023 7:15 AM Injection made incrementally with aspirations every 5 mL.  Performed by: Personally  Anesthesiologist: Erma Thom SAUNDERS, MD  Additional Notes: Discussed risks and benefits of the nerve block in detail, including but not limited vascular injury, permanent nerve damage and infection.   Patient tolerated the procedure well. Local anesthetic introduced in an incremental fashion under minimal resistance after negative aspirations. No paresthesias were elicited. After completion of the procedure, no acute issues were identified and patient continued to be monitored by RN.

## 2023-12-24 NOTE — Discharge Instructions (Signed)
   [facility]  Discharge Instructions  AV Fistula or Graft Surgery for Dialysis Access  Please refer to the following instructions for your post-procedure care. Your surgeon or physician assistant will discuss any changes with you.  Activity  You may drive the day following your surgery, if you are comfortable and no longer taking prescription pain medication. Resume full activity as the soreness in your incision resolves.  Bathing/Showering  You may shower after you go home. Keep your incision dry for 48 hours. Do not soak in a bathtub, hot tub, or swim until the incision heals completely. You may not shower if you have a hemodialysis catheter.  Incision Care  Clean your incision with mild soap and water  after 48 hours. Pat the area dry with a clean towel. You do not need a bandage unless otherwise instructed. Do not apply any ointments or creams to your incision. You may have skin glue on your incision. Do not peel it off. It will come off on its own in about one week. Your arm may swell a bit after surgery. To reduce swelling use pillows to elevate your arm so it is above your heart. Your doctor will tell you if you need to lightly wrap your arm with an ACE bandage.  Diet  Resume your normal diet. There are not special food restrictions following this procedure. In order to heal from your surgery, it is CRITICAL to get adequate nutrition. Your body requires vitamins, minerals, and protein. Vegetables are the best source of vitamins and minerals. Vegetables also provide the perfect balance of protein. Processed food has little nutritional value, so try to avoid this.  Medications  Resume taking all of your medications. If your incision is causing pain, you may take over-the counter pain relievers such as acetaminophen  (Tylenol ). If you were prescribed a stronger pain medication, please be aware these medications can cause nausea and constipation. Prevent  nausea by taking the medication with a snack or meal. Avoid constipation by drinking plenty of fluids and eating foods with high amount of fiber, such as fruits, vegetables, and grains.  Do not take Tylenol  if you are taking prescription pain medications.  Follow up Your surgeon may want to see you in the office following your access surgery. If so, this will be arranged at the time of your surgery.  Please call us  immediately for any of the following conditions:  Increased pain, redness, drainage (pus) from your incision site Fever of 101 degrees or higher Severe or worsening pain at your incision site Hand pain or numbness.  Reduce your risk of vascular disease:  Stop smoking. If you would like help, call [facility] at 1-800-QUIT-NOW (678-711-1947) or [place] at 360-794-5732  Manage your cholesterol Maintain a desired weight Control your diabetes Keep your blood pressure down  Dialysis  It will take several weeks to several months for your new dialysis access to be ready for use. Your surgeon will determine when it is okay to use it. Your nephrologist will continue to direct your dialysis. You can continue to use your Permcath until your new access is ready for use.   12/24/2023 [name] [name] [id number] 03-03-1981  Surgeon(s): Sheree Penne Bruckner, MD  Procedure(s): Creation right 1st stage basilic vein transposition  x Do not stick fistula for 12 weeks    If you have any questions, please call the office at [phone].

## 2023-12-24 NOTE — H&P (Signed)
 HPI:   [name] [name] is a 43 y.o. male has a history of multiple left upper extremity dialysis access procedures including 2 fistulas and subsequent graft.  This is now thrombosed he is on dialysis via catheter.  He is here to discuss further options moving forward.  He is right-hand-dominant has never had any access procedures on the right.  He is currently on dialysis Mondays Wednesdays and Fridays and is also on Eliquis .       Past Medical History:  Diagnosis Date   ESRD on hemodialysis (HCC)     Hypertension     Lupus     Seizures (HCC)     Seizures (HCC)     Stroke (HCC)      46 or 43 years of age.     Wears glasses               Family History  Problem Relation Age of Onset   Diabetes Mother     Diabetes Maternal Grandmother     Seizures Neg Hx          not on mother's side. he doesn't know about his father's side             Past Surgical History:  Procedure Laterality Date   AV FISTULA PLACEMENT Left 12/30/2017    Procedure: ARTERIOVENOUS (AV) FISTULA CREATION VERSUS INSERTION OF ARTERIOVENOUS GRAFT LEFT ARM;  Surgeon: Oris Krystal FALCON, MD;  Location: [facility] OR;  Service: Vascular;  Laterality: Left;   BASCILIC VEIN TRANSPOSITION Left 08/15/2018    Procedure: LEFT FIRST STAGE BASCILIC VEIN TRANSPOSITION;  Surgeon: Oris Krystal FALCON, MD;  Location: [facility] OR;  Service: Vascular;  Laterality: Left;   BASCILIC VEIN TRANSPOSITION Left 10/30/2018    Procedure: INSERTION OF GORE-TEX GRAFT LEFT UPPER ARM;  Surgeon: Sheree Penne Bruckner, MD;  Location: [facility] OR;  Service: Vascular;  Laterality: Left;   INSERTION OF DIALYSIS CATHETER N/A 09/20/2018    Procedure: INSERTION OF TUNNELED DIALYSIS CATHETER RIGHT INTERNAL JUGULAR;  Surgeon: Gretta Bruckner PARAS, MD;  Location: [facility] OR;  Service: Vascular;  Laterality: N/A;   IR FLUORO GUIDE CV LINE RIGHT   07/16/2022   IR US  GUIDE VASC ACCESS RIGHT   07/16/2022   RENAL BIOPSY              Short Social History:  Social History          Tobacco Use   Smoking status: Some Days      Types: E-cigarettes   Smokeless tobacco: Never  Substance Use Topics   Alcohol use: Not Currently      Allergies      Allergies  Allergen Reactions   Chlorhexidine  Itching   Cetirizine & Related Swelling              Current Outpatient Medications  Medication Sig Dispense Refill   acetaminophen  (TYLENOL ) 325 MG tablet Take 2 tablets (650 mg total) by mouth every 6 (six) hours as needed for mild pain.       apixaban  (ELIQUIS ) 5 MG TABS tablet Take 1 tablet (5 mg total) by mouth 2 (two) times daily.       atorvastatin  (LIPITOR) 40 MG tablet Take 1 tablet (40 mg total) by mouth daily. 90 tablet 0   AURYXIA  1 GM 210 MG(Fe) tablet Take 840 mg by mouth 3 (three) times daily.       calcitRIOL  (ROCALTROL ) 0.5 MCG capsule Take 5 capsules (2.5 mcg total) by mouth every  Monday, Wednesday, and Friday with hemodialysis.       cinacalcet  (SENSIPAR ) 30 MG tablet Take 5 tablets (150 mg total) by mouth every Monday, Wednesday, and Friday with hemodialysis.       divalproex  (DEPAKOTE ) 500 MG DR tablet Take 2 tablets (1,000 mg total) by mouth every 12 (twelve) hours.       levETIRAcetam  (KEPPRA ) 500 MG tablet Take 1 tablet (500 mg total) by mouth 2 (two) times daily.       levETIRAcetam  (KEPPRA ) 500 MG tablet Take 1 tablet (500 mg total) by mouth every Monday, Wednesday, and Friday with hemodialysis.       multivitamin (RENA-VIT) TABS tablet Take 1 tablet by mouth daily.       Nutritional Supplements (FEEDING SUPPLEMENT, NEPRO CARB STEADY,) LIQD Take 237 mLs by mouth 2 (two) times daily between meals.       QUEtiapine  (SEROQUEL ) 25 MG tablet Take 1 tablet (25 mg total) by mouth at bedtime.       sevelamer  carbonate (RENVELA ) 800 MG tablet Take 3,200 mg by mouth 3 (three) times daily with meals.          No current facility-administered medications for this visit.        Review of Systems  Constitutional:  Constitutional negative. HENT: HENT  negative.  Eyes: Eyes negative.  Respiratory: Respiratory negative.  Cardiovascular: Cardiovascular negative.  GI: Gastrointestinal negative.  Musculoskeletal: Musculoskeletal negative.  Skin: Skin negative.  Neurological: Neurological negative. Hematologic: Hematologic/lymphatic negative.  Psychiatric: Psychiatric negative.          Objective:   Vitals:   12/24/23 0605  BP: (!) 138/92  Pulse: 76  Resp: 20  Temp: 97.9 F (36.6 C)  SpO2: 93%    Physical Exam HENT:     Head: Normocephalic.     Nose: Nose normal.  Eyes:     Pupils: Pupils are equal, round, and reactive to light.  Cardiovascular:     Rate and Rhythm: Normal rate.     Pulses:          Radial pulses are 2+ on the right side and 2+ on the left side.  Abdominal:     General: Abdomen is flat.  Musculoskeletal:        General: Normal range of motion.  Skin:    General: Skin is warm.     Capillary Refill: Capillary refill takes less than 2 seconds.  Neurological:     General: No focal deficit present.     Mental Status: He is alert.  Psychiatric:        Mood and Affect: Mood normal.        Thought Content: Thought content normal.        Judgment: Judgment normal.        Data: UPPER EXTREMITY VEIN MAPPING  Patient Name:  [name] [name]  Date of Exam:   11/20/2023 Medical Rec #: [id number]       Accession #:    [id number] Date of Birth: [birth year]      Patient Gender: M Patient Age:   68 years Exam Location:  [address] Procedure:      VAS US  UPPER EXT VEIN MAPPING (PRE-OP  AVF) Referring Phys: [name]   --------------------------------------------------------------------------- -----   Indications: Pre-access.  Comparison Study: 05/01/18  Performing Technologist: Garnette Rockers    Examination Guidelines: A complete evaluation includes B-mode imaging, spectral Doppler, color Doppler, and power Doppler as needed of all accessible portions of each vessel. Bilateral testing  is  considered an integral part of a complete examination. Limited examinations for reoccurring indications may be performed as noted.  +-----------------+-------------+----------+---------+ Right Cephalic   Diameter (cm)Depth (cm)Findings  +-----------------+-------------+----------+---------+ Shoulder             0.18        1.26             +-----------------+-------------+----------+---------+ Prox upper arm       0.15        0.95             +-----------------+-------------+----------+---------+ Mid upper arm        0.13        0.43             +-----------------+-------------+----------+---------+ Dist upper arm       0.13        0.48             +-----------------+-------------+----------+---------+ Antecubital fossa    0.13        0.27   branching +-----------------+-------------+----------+---------+ Prox forearm         0.12        0.60             +-----------------+-------------+----------+---------+ Mid forearm          0.18        0.46             +-----------------+-------------+----------+---------+ Wrist                0.15        0.47             +-----------------+-------------+----------+---------+  +-----------------+-------------+----------+--------------+ Right Basilic    Diameter (cm)Depth (cm)   Findings    +-----------------+-------------+----------+--------------+ Shoulder                                not visualized +-----------------+-------------+----------+--------------+ Prox upper arm                          not visualized +-----------------+-------------+----------+--------------+ Mid upper arm        0.43        1.30                  +-----------------+-------------+----------+--------------+ Dist upper arm       0.60        0.60     branching    +-----------------+-------------+----------+--------------+ Antecubital fossa    0.22        0.36                   +-----------------+-------------+----------+--------------+ Prox forearm         0.22        0.34                  +-----------------+-------------+----------+--------------+ Mid forearm                             not visualized +-----------------+-------------+----------+--------------+ Wrist                                   not visualized +-----------------+-------------+----------+--------------+  +-----------------+-------------+----------+--------------+ Left Cephalic    Diameter (cm)Depth (cm)   Findings    +-----------------+-------------+----------+--------------+ Shoulder             0.29  0.96                  +-----------------+-------------+----------+--------------+ Prox upper arm       0.30        1.11                  +-----------------+-------------+----------+--------------+ Mid upper arm        0.22        0.75                  +-----------------+-------------+----------+--------------+ Dist upper arm                            thrombosed   +-----------------+-------------+----------+--------------+ Antecubital fossa                         thrombosed   +-----------------+-------------+----------+--------------+ Prox forearm                              thrombosed   +-----------------+-------------+----------+--------------+ Mid forearm                             not visualized +-----------------+-------------+----------+--------------+ Dist forearm                            not visualized +-----------------+-------------+----------+--------------+ Wrist                                   not visualized +-----------------+-------------+----------+--------------+  +-----------------+-------------+----------+--------------+ Left Basilic     Diameter (cm)Depth (cm)   Findings    +-----------------+-------------+----------+--------------+ Shoulder                                not  visualized +-----------------+-------------+----------+--------------+ Prox upper arm                          not visualized +-----------------+-------------+----------+--------------+ Mid upper arm                            Previous AVF  +-----------------+-------------+----------+--------------+ Dist upper arm                           Previous AVF  +-----------------+-------------+----------+--------------+ Antecubital fossa                        Previous AVF  +-----------------+-------------+----------+--------------+ Prox forearm                            not visualized +-----------------+-------------+----------+--------------+ Mid forearm                             not visualized +-----------------+-------------+----------+--------------+ Wrist                                   not visualized +-----------------+-------------+----------+--------------+         Assessment/Plan:    43 year old male with end-stage renal disease on dialysis via catheter.  He has exhausted left upper extremity access options and we will plan to move to the right.  We discussed the risk benefits alternatives and he demonstrates good understanding.  Plan right arm avf vs more likely avg today in the OR.      Adasia Hoar C. Sheree, MD [facility] Office: [phone] Pager: [phone]

## 2023-12-24 NOTE — Op Note (Signed)
    Patient name: [name] [name] MRN: [id number] DOB: April 08, 1981 Sex: male  12/24/2023 Pre-operative Diagnosis: End-stage renal disease Post-operative diagnosis:  Same Surgeon:  Penne C. Sheree, MD Assistant: Lucie Apt, PA Procedure Performed:  Right brachial artery to basilic vein AV fistula creation  Indications: 43 year old male with history of end-stage renal disease he has exhausted left upper extremity options and is now indicated for right upper extremity fistula versus graft.  We have discussed the risk benefits and alternatives and he demonstrates good understanding and agrees to proceed.  Given the complexity of the case,  the assistant was necessary in order to expedite the procedure and safely perform the technical aspects of the operation.  The assistant provided traction and countertraction to assist with exposure of the artery and vein.  They also assisted with suture ligation of multiple venous branches.  They played a critical role in the anastomosis. These skills, especially following the Prolene suture for the anastomosis, could not have been adequately performed by a scrub tech assistant.    Findings: The basilic vein did have branching at the antecubitum but overall was easily dilated to 5 mm.  The brachial artery was healthy at the antecubital.  At completion there was a very strong thrill in the fistula confirmed with Doppler and traced up the upper arm where it did enter the deep system approximately in the mid upper arm.  There were both radial and ulnar artery signals at the wrist confirmed with Doppler that did augment with compression of the fistula.   Procedure:  The patient was identified in the holding area and taken to the operating room where he was placed supine operative table and MAC anesthesia was induced.  He was sterilely prepped and draped in the right upper extremity in the usual fashion, antibiotics were administered and a timeout was called.  We began  using ultrasound identified a suitable basilic vein to the antecubital and traced this up where it entered the deep system approximately the mid upper arm.  There was also a suitable brachial artery which appeared free of disease at the antecubitum.  The block was tested a transverse incision was created we dissected out the vein marked this for orientation.  We dissected through the deep fascia encircled the brachial artery with vessel loop.  The vein was transected distally marked for orientation flushed heparinized saline dilated and clamped.  The artery was clamped this and proximally opened longitudinally and flushed with heparinized saline in both directions.  We then sewed the vein into side with 6-0 Prolene suture.  Prior to completion we allowed flushing all directions.  Upon completion there was a very strong thrill in the fistula we did free up some of the soft tissue and divided 1 large Burgner higher up in the arm through the same incision.  We then obtain hemostasis in the wound and closed in layers of Vicryl and Monocryl.  Dermabond placed at the skin level.  The patient was awakened from anesthesia having tolerated the procedure well and may complication.  All counts were correct at completion.  EBL: 10 cc    Jaxston Chohan C. Sheree, MD [facility] Office: [phone] Pager: [phone]

## 2023-12-24 NOTE — Anesthesia Postprocedure Evaluation (Signed)
 Anesthesia Post Note  Patient: [name] [name]  Procedure(s) Performed: BRACHIOBASCILIC ARTERIOVENOUS (AV) FISTULA CREATION (Right)     Patient location during evaluation: PACU Anesthesia Type: Regional and MAC Level of consciousness: awake and alert Pain management: pain level controlled Vital Signs Assessment: post-procedure vital signs reviewed and stable Respiratory status: spontaneous breathing, nonlabored ventilation, respiratory function stable and patient connected to nasal cannula oxygen Cardiovascular status: stable and blood pressure returned to baseline Postop Assessment: no apparent nausea or vomiting Anesthetic complications: no   No notable events documented.  Last Vitals:  Vitals:   12/24/23 0930 12/24/23 0945  BP: 108/81 98/75  Pulse: 63 65  Resp: 16 14  Temp:  36.5 C  SpO2: 92% 93%    Last Pain:  Vitals:   12/24/23 0945  TempSrc:   PainSc: 0-No pain                 [name]

## 2023-12-24 NOTE — Transfer of Care (Signed)
 Immediate Anesthesia Transfer of Care Note  Patient: [name] [name]  Procedure(s) Performed: BRACHIOBASCILIC ARTERIOVENOUS (AV) FISTULA CREATION (Right)  Patient Location: PACU  Anesthesia Type:MAC  Level of Consciousness: drowsy and patient cooperative  Airway & Oxygen Therapy: Patient Spontanous Breathing and Patient connected to face mask oxygen  Post-op Assessment: Report given to RN and Post -op Vital signs reviewed and stable  Post vital signs: Reviewed and stable  Last Vitals:  Vitals Value Taken Time  BP 106/56 12/24/23 08:38  Temp    Pulse 63 12/24/23 08:41  Resp 13 12/24/23 08:41  SpO2 100 % 12/24/23 08:41  Vitals shown include unfiled device data.  Last Pain:  Vitals:   12/24/23 0641  TempSrc:   PainSc: 0-No pain         Complications: No notable events documented.

## 2023-12-25 ENCOUNTER — Encounter (HOSPITAL_COMMUNITY): Payer: Self-pay | Admitting: Vascular Surgery

## 2023-12-26 ENCOUNTER — Ambulatory Visit: Admitting: Neurology

## 2023-12-26 ENCOUNTER — Encounter: Payer: Self-pay | Admitting: Neurology

## 2024-01-06 ENCOUNTER — Emergency Department (HOSPITAL_COMMUNITY)

## 2024-01-06 ENCOUNTER — Other Ambulatory Visit: Payer: Self-pay

## 2024-01-06 ENCOUNTER — Encounter (HOSPITAL_COMMUNITY): Payer: Self-pay

## 2024-01-06 ENCOUNTER — Inpatient Hospital Stay (HOSPITAL_COMMUNITY)
Admission: EM | Admit: 2024-01-06 | Discharge: 2024-01-13 | DRG: 291 | Disposition: A | Discharge status: Skilled Nursing Facility | Source: Ambulatory Visit | Attending: Family Medicine | Admitting: Family Medicine

## 2024-01-06 DIAGNOSIS — Y95 Nosocomial condition: Secondary | ICD-10-CM | POA: Diagnosis present

## 2024-01-06 DIAGNOSIS — I639 Cerebral infarction, unspecified: Secondary | ICD-10-CM | POA: Diagnosis not present

## 2024-01-06 DIAGNOSIS — Z888 Allergy status to other drugs, medicaments and biological substances status: Secondary | ICD-10-CM | POA: Diagnosis not present

## 2024-01-06 DIAGNOSIS — Z7901 Long term (current) use of anticoagulants: Secondary | ICD-10-CM | POA: Diagnosis not present

## 2024-01-06 DIAGNOSIS — J189 Pneumonia, unspecified organism: Secondary | ICD-10-CM | POA: Diagnosis present

## 2024-01-06 DIAGNOSIS — N186 End stage renal disease: Secondary | ICD-10-CM | POA: Diagnosis present

## 2024-01-06 DIAGNOSIS — Z7982 Long term (current) use of aspirin: Secondary | ICD-10-CM

## 2024-01-06 DIAGNOSIS — F17290 Nicotine dependence, other tobacco product, uncomplicated: Secondary | ICD-10-CM | POA: Diagnosis present

## 2024-01-06 DIAGNOSIS — T426X5A Adverse effect of other antiepileptic and sedative-hypnotic drugs, initial encounter: Secondary | ICD-10-CM | POA: Diagnosis present

## 2024-01-06 DIAGNOSIS — J9601 Acute respiratory failure with hypoxia: Secondary | ICD-10-CM | POA: Diagnosis present

## 2024-01-06 DIAGNOSIS — R9431 Abnormal electrocardiogram [ECG] [EKG]: Secondary | ICD-10-CM | POA: Diagnosis present

## 2024-01-06 DIAGNOSIS — D649 Anemia, unspecified: Secondary | ICD-10-CM | POA: Diagnosis present

## 2024-01-06 DIAGNOSIS — R059 Cough, unspecified: Secondary | ICD-10-CM | POA: Diagnosis present

## 2024-01-06 DIAGNOSIS — I342 Nonrheumatic mitral (valve) stenosis: Secondary | ICD-10-CM | POA: Diagnosis not present

## 2024-01-06 DIAGNOSIS — Z992 Dependence on renal dialysis: Secondary | ICD-10-CM | POA: Diagnosis not present

## 2024-01-06 DIAGNOSIS — G40909 Epilepsy, unspecified, not intractable, without status epilepticus: Secondary | ICD-10-CM | POA: Diagnosis present

## 2024-01-06 DIAGNOSIS — I050 Rheumatic mitral stenosis: Secondary | ICD-10-CM | POA: Diagnosis present

## 2024-01-06 DIAGNOSIS — Z833 Family history of diabetes mellitus: Secondary | ICD-10-CM | POA: Diagnosis not present

## 2024-01-06 DIAGNOSIS — N2581 Secondary hyperparathyroidism of renal origin: Secondary | ICD-10-CM | POA: Diagnosis present

## 2024-01-06 DIAGNOSIS — Z8673 Personal history of transient ischemic attack (TIA), and cerebral infarction without residual deficits: Secondary | ICD-10-CM

## 2024-01-06 DIAGNOSIS — E8770 Fluid overload, unspecified: Secondary | ICD-10-CM | POA: Diagnosis present

## 2024-01-06 DIAGNOSIS — E669 Obesity, unspecified: Secondary | ICD-10-CM | POA: Diagnosis present

## 2024-01-06 DIAGNOSIS — I132 Hypertensive heart and chronic kidney disease with heart failure and with stage 5 chronic kidney disease, or end stage renal disease: Principal | ICD-10-CM | POA: Diagnosis present

## 2024-01-06 DIAGNOSIS — Z883 Allergy status to other anti-infective agents status: Secondary | ICD-10-CM

## 2024-01-06 DIAGNOSIS — R0902 Hypoxemia: Secondary | ICD-10-CM

## 2024-01-06 DIAGNOSIS — R7303 Prediabetes: Secondary | ICD-10-CM | POA: Diagnosis present

## 2024-01-06 DIAGNOSIS — D539 Nutritional anemia, unspecified: Secondary | ICD-10-CM | POA: Diagnosis present

## 2024-01-06 DIAGNOSIS — Z1152 Encounter for screening for COVID-19: Secondary | ICD-10-CM

## 2024-01-06 DIAGNOSIS — R051 Acute cough: Principal | ICD-10-CM

## 2024-01-06 DIAGNOSIS — Z789 Other specified health status: Secondary | ICD-10-CM

## 2024-01-06 DIAGNOSIS — D6959 Other secondary thrombocytopenia: Secondary | ICD-10-CM | POA: Diagnosis present

## 2024-01-06 DIAGNOSIS — D631 Anemia in chronic kidney disease: Secondary | ICD-10-CM | POA: Diagnosis present

## 2024-01-06 DIAGNOSIS — Z79899 Other long term (current) drug therapy: Secondary | ICD-10-CM | POA: Diagnosis not present

## 2024-01-06 DIAGNOSIS — R0609 Other forms of dyspnea: Secondary | ICD-10-CM | POA: Diagnosis not present

## 2024-01-06 DIAGNOSIS — D619 Aplastic anemia, unspecified: Secondary | ICD-10-CM | POA: Insufficient documentation

## 2024-01-06 DIAGNOSIS — G4733 Obstructive sleep apnea (adult) (pediatric): Secondary | ICD-10-CM | POA: Diagnosis present

## 2024-01-06 DIAGNOSIS — R590 Localized enlarged lymph nodes: Secondary | ICD-10-CM | POA: Diagnosis present

## 2024-01-06 DIAGNOSIS — I10 Essential (primary) hypertension: Secondary | ICD-10-CM | POA: Diagnosis not present

## 2024-01-06 DIAGNOSIS — A630 Anogenital (venereal) warts: Secondary | ICD-10-CM | POA: Diagnosis present

## 2024-01-06 DIAGNOSIS — D696 Thrombocytopenia, unspecified: Secondary | ICD-10-CM | POA: Diagnosis present

## 2024-01-06 DIAGNOSIS — I5032 Chronic diastolic (congestive) heart failure: Secondary | ICD-10-CM | POA: Diagnosis present

## 2024-01-06 DIAGNOSIS — R509 Fever, unspecified: Secondary | ICD-10-CM

## 2024-01-06 DIAGNOSIS — R040 Epistaxis: Secondary | ICD-10-CM | POA: Diagnosis not present

## 2024-01-06 DIAGNOSIS — E8809 Other disorders of plasma-protein metabolism, not elsewhere classified: Secondary | ICD-10-CM | POA: Diagnosis present

## 2024-01-06 DIAGNOSIS — M3214 Glomerular disease in systemic lupus erythematosus: Secondary | ICD-10-CM | POA: Diagnosis present

## 2024-01-06 DIAGNOSIS — D6862 Lupus anticoagulant syndrome: Secondary | ICD-10-CM | POA: Diagnosis present

## 2024-01-06 LAB — COMPREHENSIVE METABOLIC PANEL WITH GFR
ALT: 8 U/L (ref 0–44)
AST: 19 U/L (ref 15–41)
Albumin: 3.2 g/dL — ABNORMAL LOW (ref 3.5–5.0)
Alkaline Phosphatase: 66 U/L (ref 38–126)
Anion gap: 16 — ABNORMAL HIGH (ref 5–15)
BUN: 40 mg/dL — ABNORMAL HIGH (ref 6–20)
CO2: 26 mmol/L (ref 22–32)
Calcium: 8.2 mg/dL — ABNORMAL LOW (ref 8.9–10.3)
Chloride: 95 mmol/L — ABNORMAL LOW (ref 98–111)
Creatinine, Ser: 9.52 mg/dL — ABNORMAL HIGH (ref 0.61–1.24)
GFR, Estimated: 6 mL/min — ABNORMAL LOW (ref >60)
Glucose, Bld: 81 mg/dL (ref 70–99)
Potassium: 4.4 mmol/L (ref 3.5–5.1)
Sodium: 137 mmol/L (ref 135–145)
Total Bilirubin: 0.7 mg/dL (ref 0.0–1.2)
Total Protein: 7.3 g/dL (ref 6.5–8.1)

## 2024-01-06 LAB — VANCOMYCIN, RANDOM: Vancomycin Rm: 25 ug/mL

## 2024-01-06 LAB — RESP PANEL BY RT-PCR (RSV, FLU A&B, COVID)  RVPGX2
Influenza A by PCR: NEGATIVE
Influenza B by PCR: NEGATIVE
Resp Syncytial Virus by PCR: NEGATIVE
SARS Coronavirus 2 by RT PCR: NEGATIVE

## 2024-01-06 LAB — I-STAT CG4 LACTIC ACID, ED: Lactic Acid, Venous: 1.8 mmol/L (ref 0.5–1.9)

## 2024-01-06 LAB — CBC WITH DIFFERENTIAL/PLATELET
Abs Immature Granulocytes: 0.02 K/uL (ref 0.00–0.07)
Basophils Absolute: 0 K/uL (ref 0.0–0.1)
Basophils Relative: 1 %
Eosinophils Absolute: 0.1 K/uL (ref 0.0–0.5)
Eosinophils Relative: 2 %
HCT: 32.7 % — ABNORMAL LOW (ref 39.0–52.0)
Hemoglobin: 10.5 g/dL — ABNORMAL LOW (ref 13.0–17.0)
Immature Granulocytes: 0 %
Lymphocytes Relative: 13 %
Lymphs Abs: 0.9 K/uL (ref 0.7–4.0)
MCH: 33.7 pg (ref 26.0–34.0)
MCHC: 32.1 g/dL (ref 30.0–36.0)
MCV: 104.8 fL — ABNORMAL HIGH (ref 80.0–100.0)
Monocytes Absolute: 1.3 K/uL — ABNORMAL HIGH (ref 0.1–1.0)
Monocytes Relative: 19 %
Neutro Abs: 4.5 K/uL (ref 1.7–7.7)
Neutrophils Relative %: 65 %
Platelets: 80 K/uL — ABNORMAL LOW (ref 150–400)
RBC: 3.12 MIL/uL — ABNORMAL LOW (ref 4.22–5.81)
RDW: 17.6 % — ABNORMAL HIGH (ref 11.5–15.5)
Smear Review: DECREASED
WBC: 6.9 K/uL (ref 4.0–10.5)
nRBC: 0 % (ref 0.0–0.2)

## 2024-01-06 LAB — BRAIN NATRIURETIC PEPTIDE: B Natriuretic Peptide: 846.1 pg/mL — ABNORMAL HIGH (ref 0.0–100.0)

## 2024-01-06 MED ORDER — LEVETIRACETAM 500 MG PO TABS
500.0000 mg | ORAL_TABLET | ORAL | Status: DC
Start: 2024-01-07 — End: 2024-01-07

## 2024-01-06 MED ORDER — SODIUM CHLORIDE 0.9 % IV SOLN
1.0000 g | INTRAVENOUS | Status: DC
  Filled 2024-01-06: qty 10

## 2024-01-06 MED ORDER — CALCIUM GLUCONATE 10 % IV SOLN
1.0000 g | Freq: Once | INTRAVENOUS | Status: AC
  Administered 2024-01-06: 1 g via INTRAVENOUS
  Filled 2024-01-06: qty 10

## 2024-01-06 MED ORDER — NEPRO/CARBSTEADY PO LIQD
237.0000 mL | Freq: Two times a day (BID) | ORAL | Status: DC
  Administered 2024-01-07 – 2024-01-13 (×4): 237 mL via ORAL

## 2024-01-06 MED ORDER — CALCITRIOL 0.5 MCG PO CAPS
2.5000 ug | ORAL_CAPSULE | ORAL | Status: DC
  Administered 2024-01-08 – 2024-01-13 (×2): 2.5 ug via ORAL
  Filled 2024-01-06: qty 5

## 2024-01-06 MED ORDER — CINACALCET HCL 30 MG PO TABS
150.0000 mg | ORAL_TABLET | ORAL | Status: DC
  Administered 2024-01-08 – 2024-01-13 (×2): 150 mg via ORAL
  Filled 2024-01-06: qty 5

## 2024-01-06 MED ORDER — FERRIC CITRATE 1 GM 210 MG(FE) PO TABS
840.0000 mg | ORAL_TABLET | Freq: Three times a day (TID) | ORAL | Status: DC
  Administered 2024-01-07 – 2024-01-13 (×16): 840 mg via ORAL
  Filled 2024-01-06 (×18): qty 4

## 2024-01-06 MED ORDER — ATORVASTATIN CALCIUM 40 MG PO TABS
40.0000 mg | ORAL_TABLET | Freq: Every day | ORAL | Status: DC
  Administered 2024-01-07 – 2024-01-13 (×7): 40 mg via ORAL
  Filled 2024-01-06 (×6): qty 1

## 2024-01-06 MED ORDER — DIVALPROEX SODIUM 500 MG PO DR TAB
1000.0000 mg | DELAYED_RELEASE_TABLET | Freq: Two times a day (BID) | ORAL | Status: DC
Start: 2024-01-06 — End: 2024-01-13
  Administered 2024-01-06 – 2024-01-13 (×14): 1000 mg via ORAL
  Filled 2024-01-06: qty 2
  Filled 2024-01-06: qty 4
  Filled 2024-01-06 (×3): qty 2
  Filled 2024-01-06: qty 4
  Filled 2024-01-06 (×4): qty 2
  Filled 2024-01-06: qty 4
  Filled 2024-01-06 (×3): qty 2

## 2024-01-06 MED ORDER — APIXABAN 5 MG PO TABS
5.0000 mg | ORAL_TABLET | Freq: Two times a day (BID) | ORAL | Status: DC
  Administered 2024-01-06 – 2024-01-13 (×14): 5 mg via ORAL
  Filled 2024-01-06 (×13): qty 1

## 2024-01-06 MED ORDER — SODIUM CHLORIDE 0.9% FLUSH
10.0000 mL | Freq: Two times a day (BID) | INTRAVENOUS | Status: DC
  Administered 2024-01-06 – 2024-01-12 (×11): 10 mL
  Administered 2024-01-12: 30 mL
  Administered 2024-01-13: 10 mL

## 2024-01-06 MED ORDER — SODIUM CHLORIDE 0.9% FLUSH: 10.0000 mL | INTRAVENOUS | Status: DC | PRN

## 2024-01-06 MED ORDER — ACETAMINOPHEN 325 MG PO TABS
650.0000 mg | ORAL_TABLET | Freq: Four times a day (QID) | ORAL | Status: DC | PRN
  Administered 2024-01-11: 650 mg via ORAL
  Filled 2024-01-06: qty 2

## 2024-01-06 MED ORDER — LEVETIRACETAM 500 MG PO TABS
500.0000 mg | ORAL_TABLET | Freq: Every day | ORAL | Status: DC
  Administered 2024-01-07: 500 mg via ORAL
  Filled 2024-01-06: qty 1

## 2024-01-06 MED ORDER — OXYCODONE-ACETAMINOPHEN 5-325 MG PO TABS
1.0000 | ORAL_TABLET | Freq: Four times a day (QID) | ORAL | Status: DC | PRN
  Administered 2024-01-08: 1 via ORAL
  Filled 2024-01-06 (×2): qty 1

## 2024-01-06 NOTE — ED Triage Notes (Signed)
 PT BIB [facility] from dialysis. Fever chills and non productive cough since this am. Completed dialysis and received Vancomycin  2000mg  IV and Ceftazidime 2000mg  IV at dialysis. On EMS arrival pt was hypoxic on room air spO2 76. 4L w/ 94spO2. Stays at [facility]. Dialysis recorded temp of 101.3 and en route was 99.6. 650mg  Tylenol  given en route @ 1230.   EMS VS 168/96 P95 RR 20

## 2024-01-06 NOTE — Plan of Care (Signed)
 FMTS Interim Progress Note  Spoke with nephrology on-call Dr. Gearline regarding possible need for HD given some hypervolemia upon exam and new oxygen requirement.  She states they will consult on patient and evaluate for need for HD.  Theophilus Pagan, MD 01/06/2024, 7:36 PM PGY-3, [facility] Service pager [phone]

## 2024-01-06 NOTE — Progress Notes (Signed)
 Pharmacy Antibiotic Note  [name] [name] is a 43 y.o. male admitted on 01/06/2024 with pneumonia.  Pharmacy has been consulted for vancomycin  and cefepime  dosing. Pt is ESRD pt on MWF HD, received full session today. Received vancomycin  2000mg  and ceftaz 2g at end of session  Plan: - will plan for vancomycin  1000mg  IV after each HD session, pending nephrology plan - initiate cefepime  1g IV Q24H, starting 7/22 - monitor WBC and s/sx of improvement - MRSA nares ordered - evaluate opportunity to de-escalate antibiotics as indicated  Height: 6' 1 (185.4 cm) Weight: 110 kg (242 lb 8.1 oz) IBW/kg (Calculated) : 79.9  Temp (24hrs), Avg:98.4 F (36.9 C), Min:98.1 F (36.7 C), Max:99 F (37.2 C)  Recent Labs  Lab 01/06/24 1553 01/06/24 1621  WBC 6.9  --   CREATININE 9.52*  --   LATICACIDVEN  --  1.8  VANCORANDOM 25  --     Estimated Creatinine Clearance: 12.9 mL/min (A) (by C-G formula based on SCr of 9.52 mg/dL (H)).    Allergies  Allergen Reactions   Chlorhexidine  Itching   Cetirizine & Related Swelling    Antimicrobials this admission: Vancomycin  7/21 >> Cefepime  7/21 >>  Dose adjustments this admission: N/A  Microbiology results: 7/21 BCx: in process   Thank you for allowing pharmacy to be a part of this patient's care.  Belvie Macintosh, PharmD Candidate 01/06/2024 6:49 PM

## 2024-01-06 NOTE — ED Notes (Signed)
 Transport called by [employer]

## 2024-01-06 NOTE — Assessment & Plan Note (Signed)
 History of CVA-Per prior discharge summary on Eliquis  5 mg twice daily, not on aspirin  Seizures-continue home Keppra , home Depakote  Hypertension-hold home losartan, likely restart tomorrow

## 2024-01-06 NOTE — Assessment & Plan Note (Signed)
-  Normal dialysis schedule Monday Wednesday Friday -Continue home calcitriol , Auryxia , Sensipar  -Nephrology consulted for volume management, recommendations appreciated

## 2024-01-06 NOTE — ED Notes (Signed)
 Phleb at bedside

## 2024-01-06 NOTE — ED Notes (Signed)
 IV team at bedside

## 2024-01-06 NOTE — Plan of Care (Signed)
 FMTS Interim Progress Note  S: Mr Deshmukh was seen reclining in bed, breathing 4L [place] and carrying out conversation comfortably. He reports no acute concerns this evening, no questions for night team. He needs continued dyspnea or coughing. Reports slightly less urine production than his usual small volumes, though he endorses urinating today. Discussed his case including dialysis to help w/ excess fluid, echo to evaluate for cardiac etiology, and continued lab workup, patient agreeable to plan.   O: BP (!) 149/96   Pulse (!) 104   Temp 98.1 F (36.7 C) (Oral)   Resp 16   Ht 6' 1 (1.854 m)   Wt 110 kg   SpO2 95%   BMI 31.99 kg/m   General: Awake, alert, NAD. Communicates clearly.  Cardio: RRR, no m/r/g. No JVD appreciated.  Resp: B/l coarse breath sounds w/ mild crackles on L anterior chest. Normal wob on 4L[place].  Extremities: Chronic appearing venous stasis changes b/l, moderate non pitting edema in LE b/l.   A/P:  Pneumonia Pt presented from dialysis w/ fever, dyspnea, oxygen requirement.  -4L [place], supplemental o2 as needed.  -Patient received vanc and ceftazidime at dialysis 7/21, scheduled for vanc and cefepime  7/22 AM.  -MRSA nares, CRP pending -Consider de-escalation to CAP treatment  -Consider procalcitonin for further clinical risk stratification -Check CTPE if patient's condition deteriorates overnight, low concern for PE given patient on eliquis  chronically  Volume overload Patient w/ pulmonary and peripheral edema on initial exam. CXR showing pulmonary edema.  -BNP 846 -Repeat echo pending -Nephrology consulted for HD and fluid management  Manon Jester, DO 01/06/2024, 7:35 PM PGY-1, [facility] Service pager [phone]

## 2024-01-06 NOTE — ED Provider Notes (Signed)
 Signed out by Dr Yolande that patient w esrd/hd, came from HD w fever, recent onset cough, room air pulse ox low, in 80s - to admit once labs resulted.  Pt reported received broad spectrum abx at dialysis for suspected pna/infection.   Sats currently 95% on 4 liters. No chest pain. Is breathing comfortably. Labs resulted.   Medicine consulted for admission.      Bernard Drivers, MD 01/06/24 [phone]

## 2024-01-06 NOTE — ED Provider Notes (Signed)
 [place] EMERGENCY DEPARTMENT AT [facility] Provider Note   CSN: [id number] Arrival date & time: 01/06/24  1243     Patient presents with: Cough and Fever   [name] [name] is a 44 y.o. male.   43 yo M with hx of ESRD on IHD, CVA, and lupus anticoagulant on Eliquis  who presents to the emergency department with fever and cough.  Patient reports that this morning started feeling poorly.  Having fevers and chills.  Also has had a nonproductive cough.  At dialysis received vancomycin  and ceftazidime.  He says that he does not typically get these medications and that this was the first time.  EMS went to dialysis and found him to be hypoxic on room air to 76% and was placed on 4 L.  Dialysis reported a fever of 101.7F and patient was given Tylenol  afterwards.  Called this facility who says that he has not been sick recently.  Has not been on any by antibiotics to their knowledge.       Prior to Admission medications   Medication Sig Start Date End Date Taking? Authorizing Provider  acetaminophen  (TYLENOL ) 325 MG tablet Take 2 tablets (650 mg total) by mouth every 6 (six) hours as needed for mild pain. 02/13/23   Danton Reyes DASEN, MD  apixaban  (ELIQUIS ) 5 MG TABS tablet Take 1 tablet (5 mg total) by mouth 2 (two) times daily. 11/12/23   Gonfa, Taye T, MD  atorvastatin  (LIPITOR) 40 MG tablet Take 1 tablet (40 mg total) by mouth daily. 08/03/23   Darci Pore, MD  AURYXIA  1 GM 210 MG(Fe) tablet Take 840 mg by mouth 3 (three) times daily with meals. 10/07/23   [provider]  calcitRIOL  (ROCALTROL ) 0.5 MCG capsule Take 5 capsules (2.5 mcg total) by mouth every Monday, Wednesday, and Friday with hemodialysis. 11/13/23   Gonfa, Taye T, MD  cinacalcet  (SENSIPAR ) 30 MG tablet Take 5 tablets (150 mg total) by mouth every Monday, Wednesday, and Friday with hemodialysis. 11/13/23   Gonfa, Taye T, MD  divalproex  (DEPAKOTE ) 500 MG DR tablet Take 2 tablets (1,000 mg total) by  mouth every 12 (twelve) hours. 11/12/23   Gonfa, Taye T, MD  levETIRAcetam  (KEPPRA ) 500 MG tablet Take 1 tablet (500 mg total) by mouth 2 (two) times daily. Patient taking differently: Take 500 mg by mouth See admin instructions. Take 500 mg Tues, Thurs, and Saturday twice a day 11/12/23   Gonfa, Taye T, MD  levETIRAcetam  (KEPPRA ) 500 MG tablet Take 1 tablet (500 mg total) by mouth every Monday, Wednesday, and Friday with hemodialysis. Patient taking differently: Take 500 mg by mouth every Monday, Wednesday, and Friday. At 1300 11/13/23   Gonfa, Taye T, MD  losartan (COZAAR) 50 MG tablet Take 50 mg by mouth daily.    [provider]  Multiple Vitamins-Minerals (MULTIVITAMIN WITH MINERALS) tablet Take 1 tablet by mouth daily.    [provider]  Nutritional Supplements (FEEDING SUPPLEMENT, NEPRO CARB STEADY,) LIQD Take 237 mLs by mouth 2 (two) times daily between meals. Patient not taking: Reported on 12/23/2023 11/13/23   Gonfa, Taye T, MD  oxyCODONE -acetaminophen  (PERCOCET) 5-325 MG tablet Take 1 tablet by mouth every 6 (six) hours as needed for severe pain (pain score 7-10). 12/24/23   Rhyne, Lucie PARAS, PA-C  QUEtiapine  (SEROQUEL ) 25 MG tablet Take 1 tablet (25 mg total) by mouth at bedtime. Patient not taking: Reported on 12/23/2023 11/12/23   Gonfa, Taye T, MD    Allergies:  Chlorhexidine  and Cetirizine & related    Review of Systems  Updated Vital Signs BP 120/85   Pulse 84   Temp 98.1 F (36.7 C) (Oral)   Resp 17   Ht 6' 1 (1.854 m)   Wt 110 kg   SpO2 95%   BMI 31.99 kg/m   Physical Exam Vitals and nursing note reviewed.  Constitutional:      General: He is not in acute distress.    Appearance: He is well-developed.  HENT:     Head: Normocephalic and atraumatic.     Right Ear: External ear normal.     Left Ear: External ear normal.     Nose: Nose normal.  Eyes:     Extraocular Movements: Extraocular movements intact.     Conjunctiva/sclera: Conjunctivae normal.      Pupils: Pupils are equal, round, and reactive to light.  Cardiovascular:     Rate and Rhythm: Normal rate and regular rhythm.     Heart sounds: Normal heart sounds.  Pulmonary:     Effort: Pulmonary effort is normal. No respiratory distress.     Breath sounds: Normal breath sounds.     Comments: On 3L [place] Musculoskeletal:     Cervical back: Normal range of motion and neck supple.     Right lower leg: Edema present.     Left lower leg: Edema present.     Comments: Recently created RUE fistula without drainage or erythma. Thrill palpated.   Skin:    General: Skin is warm and dry.  Neurological:     Mental Status: He is alert. Mental status is at baseline.  Psychiatric:        Mood and Affect: Mood normal.        Behavior: Behavior normal.     (all labs ordered are listed, but only abnormal results are displayed) Labs Reviewed  COMPREHENSIVE METABOLIC PANEL WITH GFR - Abnormal; Notable for the following components:      Result Value   Chloride 95 (*)    BUN 40 (*)    Creatinine, Ser 9.52 (*)    Calcium  8.2 (*)    Albumin  3.2 (*)    GFR, Estimated 6 (*)    Anion gap 16 (*)    All other components within normal limits  CBC WITH DIFFERENTIAL/PLATELET - Abnormal; Notable for the following components:   RBC 3.12 (*)    Hemoglobin 10.5 (*)    HCT 32.7 (*)    MCV 104.8 (*)    RDW 17.6 (*)    Platelets 80 (*)    Monocytes Absolute 1.3 (*)    All other components within normal limits  RESP PANEL BY RT-PCR (RSV, FLU A&B, COVID)  RVPGX2  CULTURE, BLOOD (ROUTINE X 2)  CULTURE, BLOOD (ROUTINE X 2)  VANCOMYCIN , RANDOM  I-STAT CG4 LACTIC ACID, ED  I-STAT CG4 LACTIC ACID, ED    EKG: EKG Interpretation Date/Time:  Monday January 06 2024 13:02:14 EDT Ventricular Rate:  96 PR Interval:  173 QRS Duration:  105 QT Interval:  411 QTC Calculation: 520 R Axis:   15  Text Interpretation: Sinus rhythm Left atrial enlargement Consider RVH or posterior infarct Prolonged QT interval  Confirmed by Yolande Charleston [phone]) on 01/06/2024 3:35:47 PM  Radiology: [facility] Chest 2 View Result Date: 01/06/2024 CLINICAL DATA:  Questionable sepsis. EXAM: CHEST - 2 VIEW COMPARISON:  08/02/2023. FINDINGS: Right IJ dialysis catheter terminates in the high right atrium. Heart is enlarged, stable. Lungs are low in volume  with next interstitial and airspace opacification. No pleural fluid. IMPRESSION: Mild pulmonary edema. Electronically Signed   By: Newell Eke M.D.   On: 01/06/2024 14:39     Procedures   Medications Ordered in the ED  calcium  gluconate inj 10% (1 g) URGENT USE ONLY! (1 g Intravenous Given 01/06/24 1610)                                    Medical Decision Making Amount and/or Complexity of Data Reviewed Labs: ordered. Radiology: ordered.  Risk Prescription drug management. Decision regarding hospitalization.   43 yo M with hx of ESRD on IHD, CVA, and lupus anticoagulant on Eliquis  who presents to the emergency department with fever and cough.   Initial Ddx:  Pneumonia, URI, bacteremia, sepsis, PE, pneumothorax  MDM/Course:  Patient presents emergency department with fever cough and hypoxia.  Appears to have started today.  Did receive ceftazidime and vancomycin  at dialysis prior to arrival.  Was febrile for them and did receive antipyretics prior to coming to the emergency department.  Did have a fistula that was placed recently but does not appear to be infected.  On exam is hypoxic and grossly volume overloaded.  I suspect that the patient may have a pneumonia and is at very high risk for bacteremia since he has a tunneled catheter at this point in time and recently had a vascular surgery.  Did discuss with pharmacy and no additional antibiotics are required at this point in time since he received them at dialysis prior to arrival.  Unfortunately are not sure if blood cultures were able to be obtained or not but we did send some here in the emergency department.   EKG showed prolonged QT but no other signs of hyperkalemia.  He was given calcium  we are awaiting his blood work.  It unfortunately was delayed due to difficulties with IV access.  His COVID and flu were negative.  Chest x-ray without obvious pneumonia.  Signed out to the oncoming physician (Dr. Bernard) awaiting final lab results and disposition.  This patient presents to the ED for concern of complaints listed in HPI, this involves an extensive number of treatment options, and is a complaint that carries with it a high risk of complications and morbidity. Disposition including potential need for admission considered.   Dispo: Pending remainder of workup  Additional history obtained from Nursing Home/Care Facility Records reviewed Outpatient Clinic Notes I independently reviewed the following imaging with scope of interpretation limited to determining acute life threatening conditions related to emergency care: Chest x-ray and agree with the radiologist interpretation with the following exceptions: none I personally reviewed and interpreted cardiac monitoring: normal sinus rhythm  I personally reviewed and interpreted the pt's EKG: see above for interpretation  I have reviewed the patients home medications and made adjustments as needed  Portions of this note were generated with Dragon dictation software. Dictation errors may occur despite best attempts at proofreading.     Final diagnoses:  Acute cough  Fever, unspecified fever cause    ED Discharge Orders     None          Yolande Lamar BROCKS, MD 01/06/24 1712

## 2024-01-06 NOTE — Assessment & Plan Note (Signed)
 Chronic, may be secondary to antiepileptics and/or reactive infectious etiology.

## 2024-01-06 NOTE — ED Notes (Signed)
 2nd lactic due @1820 ; extra SST, Blue drawn

## 2024-01-06 NOTE — ED Notes (Signed)
 IV attempted x1 left [facility]

## 2024-01-06 NOTE — Assessment & Plan Note (Signed)
 Stable on 4 L nasal cannula. - Admit to medicine teaching service, attending Dr. Madelon, MedSurg - Continuous pulse ox - Supplemental oxygen as needed - S/p vancomycin  and ceftazidime today at dialysis - CRP, Pro-Cal - MRSA nares - AM CBC - Vancomycin  and cefepime  per pharmacy starting tomorrow - Follow-up blood cultures - No evidence of infection at tunneled dialysis catheter site - Consider CTA PE if worsening clinically

## 2024-01-06 NOTE — H&P (Signed)
 Hospital Admission History and Physical Service Pager: [phone]  Patient name: [name] [name] Medical record number: [id number] Date of Birth: 01/10/1981 Age: 43 y.o. Gender: male  Primary Care Provider: Haze Servant, NP Consultants: None Code Status: Full, confirmed with the patient Preferred Emergency Contact: Emilio Chiles, friend, [phone]  Chief Complaint: Fever  Differential and Medical Decision Making:  [name] [name] is a 43 y.o. male presenting with fever and dyspnea. Differential for this patient's presentation of this includes pneumonia, volume overload, and pulmonary embolism.  Likely pneumonia given infectious symptoms and fever dialysis.  Likely has contributing to volume overload given exam, unclear if this represents new concomitant heart failure versus unfinished dialysis.  Doubt PE given currently taking Eliquis .   Will treat for hospital-acquired pneumonia given dialysis patient and coming from facility.  Will evaluate for further cardiac causes.  Assessment & Plan Acute hypoxic respiratory failure (HCC) Pneumonia Stable on 4 L nasal cannula. - Admit to medicine teaching service, attending Dr. Madelon, MedSurg - Continuous pulse ox - Supplemental oxygen as needed - S/p vancomycin  and ceftazidime today at dialysis - CRP, Pro-Cal - MRSA nares - AM CBC - Vancomycin  and cefepime  per pharmacy starting tomorrow - Follow-up blood cultures - No evidence of infection at tunneled dialysis catheter site - Consider CTA PE if worsening clinically Volume overload May be clinically contributing to above respiratory failure, unclear if new cardiac issue versus not at dry weight. - Significant bilateral lower extremity edema on exam with crackles present in the lungs, and pulmonary edema on chest x-ray -Previous echo demonstrating possible mitral rheumatic disease with stenosis and regurgitation, EF 65 to 70%, diastolic dysfunction - Nephrology consulted as below -  BNP - Repeat echo ESRD (end stage renal disease) (HCC) -Normal dialysis schedule Monday Wednesday Friday -Continue home calcitriol , Auryxia , Sensipar  -Nephrology consulted for volume management, recommendations appreciated Thrombocytopenia (HCC) Chronic, may be secondary to antiepileptics and/or reactive infectious etiology. Prolonged Q-T interval on ECG -Avoid QT prolonging medications - Repeat EKG a.m. Chronic health problem History of CVA-Per prior discharge summary on Eliquis  5 mg twice daily, not on aspirin  Seizures-continue home Keppra , home Depakote  Hypertension-hold home losartan, likely restart tomorrow    FEN/GI: Renal diet VTE Prophylaxis: Eliquis   Disposition: Search  History of Present Illness:  [name] [name] is a 43 y.o. male presenting with fever, dyspnea.  Patient presenting today from dialysis after desatting to the 70s per chart review in dialysis.  Patient was also reportedly febrile to 101.3.  Patient was administered vancomycin  and ceftazidime in dialysis.  He was then sent to the ED for admission.  He reports he had fever and cough that started today as well as significant chills.  He reports he was in his normal state of health yesterday.  He does have swelling of his lower extremities that he says is worse than it usually is.  Reports that he did complete dialysis today and did not leave early.  No dysuria, chest pain, no pain at tunneled dialysis catheter site, no abdominal pain, no nausea or vomiting.  In the ED, vital signs were stable, labs were unremarkable, chest x-ray showed pulmonary edema.  Review Of Systems: Per HPI.  Pertinent Past Medical History: ESRD on dialysis Hypertension History of CVA History of seizures Lupus Antiphospholipid syndrome Remainder reviewed in history tab.   Pertinent Past Surgical History: Multiple dialysis access surgeries Renal biopsy  Remainder reviewed in history tab.  Pertinent Social History: Tobacco use:  Vapes regularly, no tobacco  use Alcohol use: Used to drink but stopped a year ago Other Substance use: None Lives at a center, patient cannot remember the name  Pertinent Family History: Diabetes runs in the family.  Remainder reviewed in history tab.   Important Outpatient Medications: Eliquis  Lipitor Auryxia  Rocaltrol  Sensipar  Depakote  Keppra  Losartan patient denies taking this Percocet Seroquel  patient denies taking this  Remainder reviewed in medication history.   Objective: BP (!) 149/96   Pulse (!) 104   Temp 98.1 F (36.7 C) (Oral)   Resp 16   Ht 6' 1 (1.854 m)   Wt 110 kg   SpO2 95%   BMI 31.99 kg/m  Exam: General: A&O, NAD, lying comfortably in hospital bed eating a sandwich, does have chills HEENT: No sign of trauma, EOM grossly intact, moist mucous membranes Cardiac: RRR, no m/r/g Respiratory: CTAB, normal WOB on 4L [place], crackles present in bilateral lung bases GI: Soft, NTTP, non-distended, no rebound or guarding Extremities: NTTP, 2+ pitting edema to the proximal tibia bilaterally Neuro: Moves all four extremities appropriately. Psych: Appropriate mood and affect   Labs:  CBC BMET  Recent Labs  Lab 01/06/24 1553  WBC 6.9  HGB 10.5*  HCT 32.7*  PLT 80*   Recent Labs  Lab 01/06/24 1553  NA 137  K 4.4  CL 95*  CO2 26  BUN 40*  CREATININE 9.52*  GLUCOSE 81  CALCIUM  8.2*    Vancomycin  level 25  EKG: Sinus rhythm, partial right bundle Boen block, prolonged QTc   Imaging Studies Performed:  CXR - mild pulmonary edema worse on right than left   Alba Sharper, MD 01/06/2024, 7:29 PM PGY-3, [facility]  FPTS Intern pager: [phone], text pages welcome Secure chat group [facility] Teaching Service

## 2024-01-06 NOTE — ED Notes (Signed)
 MD notified of IV establishment difficulty and IV team consult placement

## 2024-01-06 NOTE — Assessment & Plan Note (Signed)
 May be clinically contributing to above respiratory failure, unclear if new cardiac issue versus not at dry weight. - Significant bilateral lower extremity edema on exam with crackles present in the lungs, and pulmonary edema on chest x-ray -Previous echo demonstrating possible mitral rheumatic disease with stenosis and regurgitation, EF 65 to 70%, diastolic dysfunction - Nephrology consulted as below - BNP - Repeat echo

## 2024-01-06 NOTE — Assessment & Plan Note (Signed)
-  Avoid QT prolonging medications - Repeat EKG a.m.

## 2024-01-07 ENCOUNTER — Inpatient Hospital Stay (HOSPITAL_COMMUNITY)

## 2024-01-07 ENCOUNTER — Encounter (HOSPITAL_COMMUNITY): Payer: Self-pay

## 2024-01-07 DIAGNOSIS — D619 Aplastic anemia, unspecified: Secondary | ICD-10-CM | POA: Insufficient documentation

## 2024-01-07 DIAGNOSIS — R0609 Other forms of dyspnea: Secondary | ICD-10-CM

## 2024-01-07 DIAGNOSIS — I050 Rheumatic mitral stenosis: Secondary | ICD-10-CM | POA: Insufficient documentation

## 2024-01-07 DIAGNOSIS — J9601 Acute respiratory failure with hypoxia: Secondary | ICD-10-CM

## 2024-01-07 LAB — BASIC METABOLIC PANEL WITH GFR
Anion gap: 14 (ref 5–15)
BUN: 51 mg/dL — ABNORMAL HIGH (ref 6–20)
CO2: 25 mmol/L (ref 22–32)
Calcium: 8 mg/dL — ABNORMAL LOW (ref 8.9–10.3)
Chloride: 96 mmol/L — ABNORMAL LOW (ref 98–111)
Creatinine, Ser: 11.66 mg/dL — ABNORMAL HIGH (ref 0.61–1.24)
GFR, Estimated: 5 mL/min — ABNORMAL LOW (ref >60)
Glucose, Bld: 78 mg/dL (ref 70–99)
Potassium: 4.6 mmol/L (ref 3.5–5.1)
Sodium: 135 mmol/L (ref 135–145)

## 2024-01-07 LAB — CBC
HCT: 25.6 % — ABNORMAL LOW (ref 39.0–52.0)
HCT: 28.7 % — ABNORMAL LOW (ref 39.0–52.0)
Hemoglobin: 8.5 g/dL — ABNORMAL LOW (ref 13.0–17.0)
Hemoglobin: 9.2 g/dL — ABNORMAL LOW (ref 13.0–17.0)
MCH: 34 pg (ref 26.0–34.0)
MCH: 33 pg (ref 26.0–34.0)
MCHC: 33.2 g/dL (ref 30.0–36.0)
MCHC: 32.1 g/dL (ref 30.0–36.0)
MCV: 102.4 fL — ABNORMAL HIGH (ref 80.0–100.0)
MCV: 102.9 fL — ABNORMAL HIGH (ref 80.0–100.0)
Platelets: 72 K/uL — ABNORMAL LOW (ref 150–400)
Platelets: 81 K/uL — ABNORMAL LOW (ref 150–400)
RBC: 2.5 MIL/uL — ABNORMAL LOW (ref 4.22–5.81)
RBC: 2.79 MIL/uL — ABNORMAL LOW (ref 4.22–5.81)
RDW: 17.2 % — ABNORMAL HIGH (ref 11.5–15.5)
RDW: 16.4 % — ABNORMAL HIGH (ref 11.5–15.5)
WBC: 6.5 K/uL (ref 4.0–10.5)
WBC: 5.8 K/uL (ref 4.0–10.5)
nRBC: 0 % (ref 0.0–0.2)
nRBC: 0 % (ref 0.0–0.2)

## 2024-01-07 LAB — ECHOCARDIOGRAM COMPLETE
AR max vel: 3.18 cm2
AV Area VTI: 3.15 cm2
AV Area mean vel: 3.07 cm2
AV Mean grad: 8 mmHg
AV Peak grad: 16.8 mmHg
Ao pk vel: 2.05 m/s
Height: 73 in
MV VTI: 2.53 cm2
S' Lateral: 3 cm
Weight: 3880.1 [oz_av]

## 2024-01-07 LAB — MRSA NEXT GEN BY PCR, NASAL: MRSA by PCR Next Gen: NOT DETECTED

## 2024-01-07 LAB — HEPATITIS B SURFACE ANTIGEN: Hepatitis B Surface Ag: NONREACTIVE

## 2024-01-07 MED ORDER — HEPARIN SODIUM (PORCINE) 1000 UNIT/ML IJ SOLN
INTRAMUSCULAR | Status: AC
  Filled 2024-01-07: qty 4

## 2024-01-07 MED ORDER — PENTAFLUOROPROP-TETRAFLUOROETH EX AERO: 1.0000 | INHALATION_SPRAY | CUTANEOUS | Status: DC | PRN

## 2024-01-07 MED ORDER — ALTEPLASE 2 MG IJ SOLR: 2.0000 mg | Freq: Once | INTRAMUSCULAR | Status: DC | PRN

## 2024-01-07 MED ORDER — HEPARIN SODIUM (PORCINE) 1000 UNIT/ML DIALYSIS
1000.0000 [IU] | INTRAMUSCULAR | Status: DC | PRN
  Administered 2024-01-07: 1000 [IU]

## 2024-01-07 MED ORDER — SODIUM CHLORIDE 0.9 % IV SOLN
100.0000 mg | Freq: Two times a day (BID) | INTRAVENOUS | Status: DC
  Administered 2024-01-07 – 2024-01-08 (×2): 100 mg via INTRAVENOUS
  Filled 2024-01-07 (×3): qty 100

## 2024-01-07 MED ORDER — LEVETIRACETAM 500 MG PO TABS
500.0000 mg | ORAL_TABLET | Freq: Two times a day (BID) | ORAL | Status: DC
Start: 2024-01-07 — End: 2024-01-13
  Administered 2024-01-07 – 2024-01-12 (×11): 500 mg via ORAL
  Filled 2024-01-07 (×12): qty 1

## 2024-01-07 MED ORDER — LEVETIRACETAM 500 MG PO TABS: 500.0000 mg | ORAL_TABLET | ORAL | Status: DC

## 2024-01-07 MED ORDER — SODIUM CHLORIDE 0.9 % IV SOLN
2.0000 g | INTRAVENOUS | Status: DC
  Administered 2024-01-07 – 2024-01-11 (×5): 2 g via INTRAVENOUS
  Filled 2024-01-07 (×6): qty 20

## 2024-01-07 MED ORDER — VANCOMYCIN VARIABLE DOSE PER UNSTABLE RENAL FUNCTION (PHARMACIST DOSING): Status: DC

## 2024-01-07 NOTE — Progress Notes (Signed)
 IV team consulted for new IV placement with particular parameters for CT scan. Patient with right arm restriction. Old HD site to left upper arm. Left forearm assessed with ultrasound and no suitable veins found to accommodate the catheter size required for this scan. Left upper arm additionally assessed with ultrasound, and noted to have a cephalic vein that measures appropriately.   Discussed with primary RN and MD, and received nephrology approval for upper arm IV placement by Dr. Macel.

## 2024-01-07 NOTE — Progress Notes (Addendum)
 Daily Progress Note Intern Pager: [phone]  Patient name: [name] [name] Medical record number: [id number] Date of birth: 27-Aug-1980 Age: 43 y.o. Gender: male  Primary Care Provider: Theotis Haze ORN, NP Consultants: Nephrology Code Status: FULL  Pt Overview and Major Events to Date:  -7/21: Admitted, nephrology consulted  Medical Decision Making:  MONTY SPICHER is a 43 y.o. male presenting with fever and dyspnea. Suspect CAP. On broad-spectrum antibiotics currently, will deescalate.  Patient still fairly volume overloaded; 1+ bilateral lower extremity edema. Lungs without crackles, though listened from front given patient unable to easily sit up/turn. Pulmonary edema on CXR. BNP of 846. Echo with LVEF 65-70%; LVOT gradient of 20 mmHg; mod-severe mitral stenosis.  Pertinent PMH/PSH includes ESRD on dialysis, HTN, hx CVA, hx seizure, lupus, antiphospholipid syndrome.  Assessment & Plan Acute hypoxic respiratory failure (HCC) Pneumonia Overall improved, on O2 supplementation with 2L via [place] at time of visit, though later increased back to 4L by PT after patient desatted to 70% during treatment. - Given continued hypoxia with tachycardia, will get CT PE; per nephrology, okay for contrast, but will do this after dialysis today 7/22 - Abx: Ordered for IV Vancomycin , cefepime , had not yet gotten either; discontinue both - START IV Ceftriaxone , doxycycline  (7/22-) - Pain: Tylenol  650 mg q6h PRN, oxycodone -acetaminophen  5-325 q6h PRN - If patient using more frequently, consider adjusting frequency vs dose - Labs: CRP, MRSA pending - Blood cultures: No growth <24 hours, continue to follow - Continue to wean O2 as tolerated - Continuous pulse ox - PT/OT eval and treat Volume overload Mitral stenosis - Nephrology consulted re: volume management - Patient to get additional dialysis today 7/22 - Will consult cardiology tomorrow 7/23 Anemia Hgb dropped from 10.5 to 8.5 today -  Repeat CBC this PM ESRD (end stage renal disease) (HCC) Outpatient dialysis schedule MWF. Nephrology to see today. - Continue home calcitriol , Auryxia , Sensipar  - Appreciate nephrology recommendations - Patient to get additional dialysis today 7/22 Prolonged Q-T interval on ECG EKG this morning with Qtc of 492, improved. - Avoid QT prolonging medications - Will repeat EKG following next dialysis session Chronic health problem History of CVA - Per prior discharge summary on Eliquis  5 mg twice daily, not on aspirin  Seizures - continue home Keppra , home Depakote  Hypertension - restart home losartan 50 mg daily Thrombocytopenia - Chronic, suspect 2/2 antiepileptics an/or reactive infectious etiology Antiphospholipid syndrome - On Eliquis  rather than warfarin since 2019; not currently on aspirin , unclear reason why   FEN/GI: Renal diet PPx: Eliquis  5 mg twice daily Dispo: Home pending clinical improvement . Barriers include continued clinical improvement, narrowing of abx treatment.   Subjective:  Patient is doing well with no concerns this morning. He reports he is feeling better today compared to when he first came in. Was able to turn down O2 supplementation from 4L to 2L while in the room without resulting shortness of breath.  Objective: Temp:  [98.1 F (36.7 C)-99.8 F (37.7 C)] 99.3 F (37.4 C) (07/22 0434) Pulse Rate:  [84-104] 97 (07/22 0621) Resp:  [14-26] 20 (07/22 0621) BP: (120-156)/(66-98) 131/66 (07/22 0434) SpO2:  [74 %-100 %] 97 % (07/22 0621) Weight:  [110 kg] 110 kg (07/21 1251) Physical Exam: General: Patient is lying down in bed, no acute distress. Cardiovascular: Regular rate and rhythm, no murmurs/rubs/gallops. Respiratory: Normal work of breathing on 2L via [place]. Clear to auscultation bilaterally from the front; no wheezes, crackles. Abdomen: Bowel sounds present and normoactive  bilaterally. Soft, nondistended, nontender. Extremities: Skin warm, dry. 1+  bilateral lower extremity pitting edema.  Laboratory: Most recent CBC Lab Results  Component Value Date   WBC 6.5 01/07/2024   HGB 8.5 (L) 01/07/2024   HCT 25.6 (L) 01/07/2024   MCV 102.4 (H) 01/07/2024   PLT 72 (L) 01/07/2024   Most recent BMP    Latest Ref Rng & Units 01/07/2024    3:31 AM  BMP  Glucose 70 - 99 mg/dL 78   BUN 6 - 20 mg/dL 51   Creatinine 9.38 - 1.24 mg/dL 88.33   Sodium 864 - 854 mmol/L 135   Potassium 3.5 - 5.1 mmol/L 4.6   Chloride 98 - 111 mmol/L 96   CO2 22 - 32 mmol/L 25   Calcium  8.9 - 10.3 mg/dL 8.0    CRP in process  Echo 7/22: LVEF 65-70%. LVOT gradient 20 mmHg. Severe concentric LV hypertrophy. LA moderately dilated. No pericardial effusion. Moderate to severe mitral stenosis. Right atrial pressure 15 mmHg   Larraine Palma, MD 01/07/2024, 7:11 AM  PGY-1, [facility] FPTS Intern pager: [phone], text pages welcome Secure chat group [facility] Teaching Service

## 2024-01-07 NOTE — Consult Note (Signed)
 Renal Service Consult Note [facility]  [name] [name] 01/07/2024 [name] JONETTA Fret, MD Requesting Physician: Dr. Madelon, A.   Reason for Consult: ESRD pt w/  HPI: The patient is a 43 y.o. year-old w/ PMH as below who presented to ED yesterday afternoon brought from dialysis by EMS.  He had been having fever, chills and cough since yesterday morning.  At the end of dialysis he received IV vancomycin  and Fortaz.  EMS found the patient to be hypoxic with room air sats 76%.  94% on 4 L [place].  Dialysis recorded temp of 101.3.  Chest x-ray showed mild bilateral pulmonary edema.  Patient noted chronic lower extremity swelling.  Got full dialysis yesterday.  Patient was admitted by the family practice team.  We are asked to see for dialysis.   Pt seen in room, on [place] O2. States he lives in a group home. Denies any problems with HD.    ROS - denies CP, no joint pain, no HA, no blurry vision, no rash, no diarrhea, no nausea/ vomiting   Past Medical History  Past Medical History:  Diagnosis Date   ESRD on hemodialysis (HCC)    Hypertension    Lupus    Seizures (HCC)    Seizures (HCC)    Stroke (HCC)    60 or 43 years of age.     Wears glasses    Past Surgical History  Past Surgical History:  Procedure Laterality Date   AV FISTULA PLACEMENT Left 12/30/2017   Procedure: ARTERIOVENOUS (AV) FISTULA CREATION VERSUS INSERTION OF ARTERIOVENOUS GRAFT LEFT ARM;  Surgeon: Oris Krystal FALCON, MD;  Location: [facility] OR;  Service: Vascular;  Laterality: Left;   AV FISTULA PLACEMENT Right 12/24/2023   Procedure: BRACHIOBASCILIC ARTERIOVENOUS (AV) FISTULA CREATION;  Surgeon: Sheree Penne Bruckner, MD;  Location: [facility] OR;  Service: Vascular;  Laterality: Right;   BASCILIC VEIN TRANSPOSITION Left 08/15/2018   Procedure: LEFT FIRST STAGE BASCILIC VEIN TRANSPOSITION;  Surgeon: Oris Krystal FALCON, MD;  Location: [facility] OR;  Service: Vascular;  Laterality: Left;   BASCILIC VEIN TRANSPOSITION Left 10/30/2018    Procedure: INSERTION OF GORE-TEX GRAFT LEFT UPPER ARM;  Surgeon: Sheree Penne Bruckner, MD;  Location: [facility] OR;  Service: Vascular;  Laterality: Left;   INSERTION OF DIALYSIS CATHETER N/A 09/20/2018   Procedure: INSERTION OF TUNNELED DIALYSIS CATHETER RIGHT INTERNAL JUGULAR;  Surgeon: Gretta Bruckner PARAS, MD;  Location: [facility] OR;  Service: Vascular;  Laterality: N/A;   IR FLUORO GUIDE CV LINE RIGHT  07/16/2022   IR US  GUIDE VASC ACCESS RIGHT  07/16/2022   RENAL BIOPSY     Family History  Family History  Problem Relation Age of Onset   Diabetes Mother    Diabetes Maternal Grandmother    Seizures Neg Hx        not on mother's side. he doesn't know about his father's side   Social History  reports that he has been smoking e-cigarettes. He has never used smokeless tobacco. He reports that he does not currently use alcohol. He reports that he does not use drugs. Allergies  Allergies  Allergen Reactions   Chlorhexidine  Itching   Cetirizine & Related Swelling   Home medications Prior to Admission medications   Medication Sig Start Date End Date Taking? Authorizing Provider  acetaminophen  (TYLENOL ) 325 MG tablet Take 2 tablets (650 mg total) by mouth every 6 (six) hours as needed for mild pain. 02/13/23   Danton Reyes DASEN, MD  apixaban  (ELIQUIS ) 5 MG TABS  tablet Take 1 tablet (5 mg total) by mouth 2 (two) times daily. 11/12/23   Gonfa, Taye T, MD  atorvastatin  (LIPITOR) 40 MG tablet Take 1 tablet (40 mg total) by mouth daily. 08/03/23   Darci Pore, MD  AURYXIA  1 GM 210 MG(Fe) tablet Take 840 mg by mouth 3 (three) times daily with meals. 10/07/23   [provider]  calcitRIOL  (ROCALTROL ) 0.5 MCG capsule Take 5 capsules (2.5 mcg total) by mouth every Monday, Wednesday, and Friday with hemodialysis. 11/13/23   Gonfa, Taye T, MD  cinacalcet  (SENSIPAR ) 30 MG tablet Take 5 tablets (150 mg total) by mouth every Monday, Wednesday, and Friday with hemodialysis. 11/13/23   Gonfa, Taye T, MD   divalproex  (DEPAKOTE ) 500 MG DR tablet Take 2 tablets (1,000 mg total) by mouth every 12 (twelve) hours. 11/12/23   Gonfa, Taye T, MD  levETIRAcetam  (KEPPRA ) 500 MG tablet Take 1 tablet (500 mg total) by mouth 2 (two) times daily. Patient taking differently: Take 500 mg by mouth See admin instructions. Take 500 mg Tues, Thurs, and Saturday twice a day 11/12/23   Gonfa, Taye T, MD  levETIRAcetam  (KEPPRA ) 500 MG tablet Take 1 tablet (500 mg total) by mouth every Monday, Wednesday, and Friday with hemodialysis. Patient taking differently: Take 500 mg by mouth every Monday, Wednesday, and Friday. At 1300 11/13/23   Gonfa, Taye T, MD  losartan (COZAAR) 50 MG tablet Take 50 mg by mouth daily.    [provider]  Multiple Vitamins-Minerals (MULTIVITAMIN WITH MINERALS) tablet Take 1 tablet by mouth daily.    [provider]  Nutritional Supplements (FEEDING SUPPLEMENT, NEPRO CARB STEADY,) LIQD Take 237 mLs by mouth 2 (two) times daily between meals. Patient not taking: Reported on 12/23/2023 11/13/23   Gonfa, Taye T, MD  oxyCODONE -acetaminophen  (PERCOCET) 5-325 MG tablet Take 1 tablet by mouth every 6 (six) hours as needed for severe pain (pain score 7-10). 12/24/23   Rhyne, Lucie PARAS, PA-C  QUEtiapine  (SEROQUEL ) 25 MG tablet Take 1 tablet (25 mg total) by mouth at bedtime. Patient not taking: Reported on 12/23/2023 11/12/23   Kathrin Simmer T, MD     Vitals:   01/06/24 2032 01/06/24 2350 01/07/24 0434 01/07/24 0621  BP: (!) 142/93 (!) 140/85 131/66   Pulse: 91 91 100 97  Resp: 20 18 17 20   Temp: 98.2 F (36.8 C) 99.8 F (37.7 C) 99.3 F (37.4 C)   TempSrc: Oral     SpO2: (!) 89% 94% (!) 74% 97%  Weight:      Height:       Exam Gen alert, no distress, [place] O2 4L  No rash, cyanosis or gangrene Sclera anicteric, throat clear  No jvd or bruits Chest clear bilat to bases, no rales/ wheezing RRR no MRG Abd soft ntnd no mass or ascites +bs GU deferred MS no joint effusions or  deformity Ext 2+ diffuse bilat LE and RUE edema Neuro is alert, Ox 3 , nf    RIJ TDC intact  Home bp meds: Losartan 50 mg daily   OP HD: MWF [facility] 4h   B400   108kg   2K bath  TDC   Heparin  none Prog vol^^, was 12kg over 3 wks ago and 20kg over this past wk Goes to all his sessions and stays on machine   Assessment/ Plan: PNA: w/ fever, SOB, O2 requirement Pulm edema: by CXR and sig vol overload/ anasarca on exam. Came off 20kg over at OP HD monday.  ESRD: on HD MWF. Had HD yest outpt. HD today d/t pulm edema.  HTN: takes ARB at home, bp's wnl here.  Volume: overloaded as noted above.  Anemia of esrd: Hb 8.5- 11 here, follow.        Myer Fret  MD [facility] 01/07/2024, 8:53 AM  Recent Labs  Lab 01/06/24 1553 01/07/24 0331  HGB 10.5* 8.5*  ALBUMIN  3.2*  --   CALCIUM  8.2* 8.0*  CREATININE 9.52* 11.66*  K 4.4 4.6   Inpatient medications:  apixaban   5 mg Oral BID   atorvastatin   40 mg Oral Daily   [START ON 01/08/2024] calcitRIOL   2.5 mcg Oral Q M,W,F-HD   [START ON 01/08/2024] cinacalcet   150 mg Oral Q M,W,F-HD   divalproex   1,000 mg Oral Q12H   feeding supplement (NEPRO CARB STEADY)  237 mL Oral BID BM   ferric citrate   840 mg Oral TID WC   levETIRAcetam   500 mg Oral Daily   levETIRAcetam   500 mg Oral Once per day on Sunday Tuesday Thursday Saturday   sodium chloride  flush  10-40 mL Intracatheter Q12H   vancomycin  variable dose per unstable renal function (pharmacist dosing)   Does not apply See admin instructions    ceFEPime  (MAXIPIME ) IV     acetaminophen , oxyCODONE -acetaminophen , sodium chloride  flush

## 2024-01-07 NOTE — Plan of Care (Signed)

## 2024-01-07 NOTE — Assessment & Plan Note (Deleted)
   Chronic, may be secondary to antiepileptics and/or reactive infectious etiology.

## 2024-01-07 NOTE — Assessment & Plan Note (Addendum)
 History of CVA - Per prior discharge summary on Eliquis  5 mg twice daily, not on aspirin  Seizures - continue home Keppra , home Depakote  Hypertension - restart home losartan 50 mg daily Thrombocytopenia - Chronic, suspect 2/2 antiepileptics an/or reactive infectious etiology Antiphospholipid syndrome - On Eliquis  rather than warfarin since 2019; not currently on aspirin , unclear reason why

## 2024-01-07 NOTE — Progress Notes (Signed)
 Physical Therapy Evaluation Patient Details Name: [name] [name] MRN: [id number] DOB: 02-24-1981 Today's Date: 01/07/2024  History of Present Illness  [name] [name] is a 43 y.o. male admitted 01/06/24 with fever, dyspnea, desatting to the 70s per chart review in dialysis, cough, swelling BLE. Dx with acute hypoxic respiratory failure.  PMH includes ESRD on hemodialysis, AV fistula placement (Right, 12/24/2023), HTN, Lupus, Seizures (May 2025), Stroke (May 2025), and Wears glasses.  Clinical Impression  Pt presented supine in bed with HOB elevated, awake and willing to participate in therapy session. Prior to admission, pt reported that he was at a rehab facility [facility]) since his previous admission. He stated that he was ambulating and completing ADLs independently. Pt also stated that someone from the facility assisted him with applying for housing (an apartment). At the time of evaluation, pt able to complete bed mobility with supervision, transfers with CGA and ambulated in the hallway with constant CGA and intermittent min A for stability without use of an AD. Of note, pt on 2L of O2 upon PT arrival with SpO2 decreasing below 90% with activity and requiring an increase of O2 to 4L and then to 6L with ambulation. During and after ambulation, pt's SpO2 decreasing to as low as 77% with gradual return to >90% with sitting rest break and instruction in pursed-lip breathing. Pt on 4L of O2 at end of session with SpO2 >90%. RN aware. Pt would continue to benefit from skilled physical therapy services at this time while admitted and after d/c to address the below listed limitations in order to improve overall safety and independence with functional mobility.         If plan is discharge home, recommend the following: A little help with walking and/or transfers;A little help with bathing/dressing/bathroom;Assist for transportation   Can travel by private vehicle   Yes    Equipment  Recommendations None recommended by PT  Recommendations for Other Services       Functional Status Assessment Patient has had a recent decline in their functional status and demonstrates the ability to make significant improvements in function in a reasonable and predictable amount of time.     Precautions / Restrictions Precautions Precautions: Fall Recall of Precautions/Restrictions: Intact Precaution/Restrictions Comments: monitor SpO2 Restrictions Weight Bearing Restrictions Per Provider Order: No      Mobility  Bed Mobility Overal bed mobility: Needs Assistance Bed Mobility: Supine to Sit     Supine to sit: Supervision     General bed mobility comments: increased time and effort, no external physical assistance needed    Transfers Overall transfer level: Needs assistance Equipment used: None Transfers: Sit to/from Stand Sit to Stand: Contact guard assist           General transfer comment: CGA for safety with transitional movement; pt with poor eccentric control when returning to a sitting position in recliner chair at end of session    Ambulation/Gait Ambulation/Gait assistance: Contact guard assist, Min assist, +2 safety/equipment Gait Distance (Feet): 75 Feet Assistive device: None Gait Pattern/deviations: Step-through pattern, Decreased stride length, Wide base of support Gait velocity: decreased     General Gait Details: pt with slow, cautious gait with mild instability requiring light min A for balance intermittently with constant CGA; pt fatigued and requiring one standing rest break halfway through walk  Stairs            Wheelchair Mobility     Tilt Bed    Modified Rankin (Stroke Patients Only)  Balance Overall balance assessment: Needs assistance Sitting-balance support: Feet supported Sitting balance-Leahy Scale: Good Sitting balance - Comments: pt able to don one sock sitting EOB wtih no LOB; difficulty putting sock on L LE  due to increased edema and ROM limitations   Standing balance support: No upper extremity supported Standing balance-Leahy Scale: Fair Standing balance comment: pt able to stand at sink to complete ADL task with CGA                             Pertinent Vitals/Pain Pain Assessment Pain Assessment: No/denies pain    Home Living Family/patient expects to be discharged to:: Skilled nursing facility                   Additional Comments: pt planning to return to [employer]; in process of applying for an apartment    Prior Function Prior Level of Function : Independent/Modified Independent             Mobility Comments: pt reported that he ambulates without an AD ADLs Comments: pt reported that he is independent with ADLs. Uses [facility] for transport to/from HD     Extremity/Trunk Assessment   Upper Extremity Assessment Upper Extremity Assessment: Defer to OT evaluation    Lower Extremity Assessment Lower Extremity Assessment: Generalized weakness       Communication   Communication Communication: Impaired Factors Affecting Communication: Reduced clarity of speech    Cognition Arousal: Alert Behavior During Therapy: Flat affect   PT - Cognitive impairments: Awareness, Problem solving, Safety/Judgement                       PT - Cognition Comments: pt with history of recent stroke (May 2025) with decreased awareness of deficits and visual impairments Following commands: Intact       Cueing Cueing Techniques: Verbal cues, Visual cues, Gestural cues     General Comments      Exercises     Assessment/Plan    PT Assessment Patient needs continued PT services  PT Problem List Decreased strength;Decreased range of motion;Decreased activity tolerance;Decreased balance;Decreased mobility;Decreased coordination;Decreased knowledge of use of DME;Decreased safety awareness;Decreased knowledge of precautions;Cardiopulmonary status  limiting activity       PT Treatment Interventions DME instruction;Gait training;Stair training;Functional mobility training;Therapeutic activities;Therapeutic exercise;Balance training;Neuromuscular re-education;Patient/family education    PT Goals (Current goals can be found in the Care Plan section)  Acute Rehab PT Goals Patient Stated Goal: to return to rehab PT Goal Formulation: With patient Time For Goal Achievement: 01/21/24 Potential to Achieve Goals: Good    Frequency Min 3X/week     Co-evaluation PT/OT/SLP Co-Evaluation/Treatment: Yes Reason for Co-Treatment: For patient/therapist safety;To address functional/ADL transfers;Other (comment) (pt endurance/tolerance) PT goals addressed during session: Mobility/safety with mobility;Balance;Proper use of DME;Strengthening/ROM         AM-PAC PT 6 Clicks Mobility  Outcome Measure Help needed turning from your back to your side while in a flat bed without using bedrails?: None Help needed moving from lying on your back to sitting on the side of a flat bed without using bedrails?: None Help needed moving to and from a bed to a chair (including a wheelchair)?: A Little Help needed standing up from a chair using your arms (e.g., wheelchair or bedside chair)?: None Help needed to walk in hospital room?: A Little Help needed climbing 3-5 steps with a railing? : A Lot 6 Click Score: 20  End of Session Equipment Utilized During Treatment: Gait belt;Oxygen;Other (comment) (4-6L of supplemental O2 via [place]) Activity Tolerance: Patient limited by fatigue;Patient tolerated treatment well Patient left: in chair;with call bell/phone within reach Nurse Communication: Mobility status;Other (comment) (increased O2 need with activity) PT Visit Diagnosis: Other abnormalities of gait and mobility (R26.89)    Time: 9044-8969 PT Time Calculation (min) (ACUTE ONLY): 35 min   Charges:   PT Evaluation $PT Eval Moderate Complexity: 1 Mod    PT General Charges $$ ACUTE PT VISIT: 1 Visit         Delon DELENA KLEIN, DPT  Acute Rehabilitation Services Office [phone]   Delon HERO Jasmon Graffam 01/07/2024, 11:17 AM

## 2024-01-07 NOTE — Assessment & Plan Note (Addendum)
 Hgb dropped from 10.5 to 8.5 today - Repeat CBC this PM

## 2024-01-07 NOTE — Assessment & Plan Note (Addendum)
 Outpatient dialysis schedule MWF. Nephrology to see today. - Continue home calcitriol , Auryxia , Sensipar  - Appreciate nephrology recommendations - Patient to get additional dialysis today 7/22

## 2024-01-07 NOTE — Assessment & Plan Note (Addendum)
-   Nephrology consulted re: volume management - Patient to get additional dialysis today 7/22 - Will consult cardiology tomorrow 7/23

## 2024-01-07 NOTE — Assessment & Plan Note (Addendum)
 Overall improved, on O2 supplementation with 2L via [place] at time of visit, though later increased back to 4L by PT after patient desatted to 70% during treatment. - Given continued hypoxia with tachycardia, will get CT PE; per nephrology, okay for contrast, but will do this after dialysis today 7/22 - Abx: Ordered for IV Vancomycin , cefepime , had not yet gotten either; discontinue both - START IV Ceftriaxone , doxycycline  (7/22-) - Pain: Tylenol  650 mg q6h PRN, oxycodone -acetaminophen  5-325 q6h PRN - If patient using more frequently, consider adjusting frequency vs dose - Labs: CRP, MRSA pending - Blood cultures: No growth <24 hours, continue to follow - Continue to wean O2 as tolerated - Continuous pulse ox - PT/OT eval and treat

## 2024-01-07 NOTE — Progress Notes (Signed)
   01/07/24 1743  Vitals  Temp 98.4 F (36.9 C)  Pulse Rate 76  Resp 13  BP 116/73  SpO2 98 %  O2 Device Nasal Cannula  Weight 129.5 kg  Type of Weight Post-Dialysis  Oxygen Therapy  O2 Flow Rate (L/min) 4 L/min  Pulse Oximetry Type Continuous  Oximetry Probe Site Changed No  Post Treatment  Dialyzer Clearance Lightly streaked  Liters Processed 84  Fluid Removed (mL) 3500 mL  Tolerated HD Treatment Yes   Received patient in bed to unit.  Alert and oriented.  Informed consent signed and in chart.   TX duration: 3.5  Patient tolerated well.  Transported back to the room  Alert, without acute distress.  Hand-off given to patient's nurse.   Access used: Yes Access issues: No  Total UF removed: 3500 Medication(s) given: See MAR Post HD VS: See Above Grid Post HD weight: 129.5 kg   Zebedee DELENA [facility]

## 2024-01-07 NOTE — Assessment & Plan Note (Addendum)
 EKG this morning with Qtc of 492, improved. - Avoid QT prolonging medications - Will repeat EKG following next dialysis session

## 2024-01-07 NOTE — Evaluation (Signed)
 Occupational Therapy Evaluation Patient Details Name: [name] [name] MRN: [id number] DOB: Jan 20, 1981 Today's Date: 01/07/2024   History of Present Illness   [name] [name] is a 43 y.o. male admitted 01/06/24 with fever, dyspnea, desatting to the 70s per chart review in dialysis, cough, swelling BLE. Dx with acute hypoxic respiratory failure.  PMH includes ESRD on hemodialysis, AV fistula placement (Right, 12/24/2023), HTN, Lupus, Seizures (May 2025), Stroke (May 2025), and Wears glasses.     Clinical Impressions Pt has been a [facility] since hospital admission in May. He reports using [facility] to get to/from HD and being independent in ADL at the facility. Reports that someone from SNF is helping him apply for housing. Today he presents with decreased balance, VISION, strength, decreased activity tolerance and cognition, and new SpO2 needs. Pt mod A for LB dressing, set up for bathing, min A for standing grooming (opening containers min cues for task completion) Pt on 2L of O2 upon therapy arrival with SpO2 decreasing below 90% with activity and requiring an increase of O2 to 4L and then to 6L with ambulation. During and after ambulation, pt's SpO2 decreasing to as low as 77% with gradual return to >90% with sitting rest break and instruction in PLB. Pt on 4L of O2 at EOS with SpO2 >90%. RN aware Pt NEEDS further visual assessment and will benefit from skilled OT in the acute setting to maximize safety and independence in ADL and transfers as well as return to <3 hours daily rehab. Next session to focus on visual assessment and continued OOB tasks to work on generalized weakness and activity tolerance.     If plan is discharge home, recommend the following:   A little help with walking and/or transfers;A little help with bathing/dressing/bathroom;Assistance with cooking/housework;Direct supervision/assist for medications management;Direct supervision/assist for financial management;Assist  for transportation;Supervision due to cognitive status     Functional Status Assessment   Patient has had a recent decline in their functional status and demonstrates the ability to make significant improvements in function in a reasonable and predictable amount of time.     Equipment Recommendations   None recommended by OT (defer to next venue)     Recommendations for Other Services   PT consult     Precautions/Restrictions   Precautions Precautions: Fall Recall of Precautions/Restrictions: Intact Precaution/Restrictions Comments: monitor SpO2 Restrictions Weight Bearing Restrictions Per Provider Order: No     Mobility Bed Mobility Overal bed mobility: Needs Assistance Bed Mobility: Supine to Sit     Supine to sit: Supervision     General bed mobility comments: increased time and effort, no external physical assistance needed    Transfers Overall transfer level: Needs assistance Equipment used: None Transfers: Sit to/from Stand Sit to Stand: Contact guard assist           General transfer comment: CGA for safety with transitional movement; pt with poor eccentric control when returning to a sitting position in recliner chair at end of session      Balance Overall balance assessment: Needs assistance Sitting-balance support: Feet supported Sitting balance-Leahy Scale: Good Sitting balance - Comments: pt able to don one sock sitting EOB wtih no LOB; difficulty putting sock on L LE due to increased edema and ROM limitations   Standing balance support: No upper extremity supported Standing balance-Leahy Scale: Fair Standing balance comment: pt able to stand at sink to complete ADL task with CGA  ADL either performed or assessed with clinical judgement   ADL Overall ADL's : Needs assistance/impaired Eating/Feeding: Set up;Sitting   Grooming: Oral care;Wash/dry face;Wash/dry hands;Minimal  assistance;Standing Grooming Details (indicate cue type and reason): assist for opening containers Upper Body Bathing: Supervision/ safety;Sitting   Lower Body Bathing: Contact guard assist;Sitting/lateral leans   Upper Body Dressing : Set up;Sitting   Lower Body Dressing: Moderate assistance;Sit to/from stand Lower Body Dressing Details (indicate cue type and reason): assist to don sock on L leg and buckle belt on pants Toilet Transfer: Contact guard assist;Ambulation Toilet Transfer Details (indicate cue type and reason): vc for safety with lower/rise Toileting- Clothing Manipulation and Hygiene: Contact guard assist;Sit to/from stand       Functional mobility during ADLs: Contact guard assist (ambulation, extra O2 requirements) General ADL Comments: decreased activity tolerance. new O2 needs, decreased balance, VISION deficits     Vision Baseline Vision/History: 1 Wears glasses Ability to See in Adequate Light: 1 Impaired Patient Visual Report: Other (comment) (needs further assessment) Vision Assessment?: Vision impaired- to be further tested in functional context Additional Comments: Pt frequently undershooting, miscalculating bc of vision - but Pt reporting Its normal needs further assessment     Perception         Praxis         Pertinent Vitals/Pain Pain Assessment Pain Assessment: No/denies pain     Extremity/Trunk Assessment Upper Extremity Assessment Upper Extremity Assessment: Generalized weakness (BUE edema (especially in hands))   Lower Extremity Assessment Lower Extremity Assessment: Defer to PT evaluation (BLE edema)   Cervical / Trunk Assessment Cervical / Trunk Assessment: Normal   Communication Communication Communication: Impaired Factors Affecting Communication: Reduced clarity of speech   Cognition Arousal: Alert Behavior During Therapy: Flat affect Cognition: Cognition impaired     Awareness: Online awareness impaired, Intellectual  awareness intact   Attention impairment (select first level of impairment): Selective attention                     Following commands: Intact       Cueing  General Comments   Cueing Techniques: Verbal cues;Visual cues;Gestural cues  Pt required up to 6LO2 during mobilty. down to 77 on 4L. Cues for closed mouth (mouth breather) and PLB. HR maintained around 100-110.   Exercises     Shoulder Instructions      Home Living Family/patient expects to be discharged to:: Skilled nursing facility                                 Additional Comments: pt planning to return to [facility]; in process of applying for an apartment      Prior Functioning/Environment Prior Level of Function : Independent/Modified Independent             Mobility Comments: pt reported that he ambulates without an AD ADLs Comments: pt reported that he is independent with ADLs. Uses [facility] for transport to/from HD    OT Problem List: Decreased strength;Decreased activity tolerance;Impaired balance (sitting and/or standing);Impaired vision/perception;Decreased safety awareness;Cardiopulmonary status limiting activity;Obesity   OT Treatment/Interventions: Self-care/ADL training;Therapeutic activities;Visual/perceptual remediation/compensation;Patient/family education;Balance training      OT Goals(Current goals can be found in the care plan section)   Acute Rehab OT Goals Patient Stated Goal: get his own place OT Goal Formulation: With patient Time For Goal Achievement: 01/21/24 Potential to Achieve Goals: Fair ADL Goals Pt Will Perform Grooming: with modified  independence;standing Pt Will Perform Upper Body Dressing: with modified independence;sitting Pt Will Perform Lower Body Dressing: with modified independence;sit to/from stand Pt Will Transfer to Toilet: with modified independence;ambulating Pt Will Perform Toileting - Clothing Manipulation and hygiene: with  modified independence;sit to/from stand Additional ADL Goal #1: Pt will find, locate, and tag at least 3 targets during a pathfinding task utilizing visual compensatory strategies to achieve this with no cues for assist   OT Frequency:  Min 2X/week    Co-evaluation PT/OT/SLP Co-Evaluation/Treatment: Yes Reason for Co-Treatment: For patient/therapist safety;To address functional/ADL transfers;Other (comment) (pt endurance/tolerance) PT goals addressed during session: Mobility/safety with mobility;Balance;Proper use of DME;Strengthening/ROM OT goals addressed during session: ADL's and self-care;Strengthening/ROM (VISION)      AM-PAC OT 6 Clicks Daily Activity     Outcome Measure Help from another person eating meals?: A Little Help from another person taking care of personal grooming?: A Little Help from another person toileting, which includes using toliet, bedpan, or urinal?: A Little Help from another person bathing (including washing, rinsing, drying)?: A Little Help from another person to put on and taking off regular upper body clothing?: A Little Help from another person to put on and taking off regular lower body clothing?: A Lot 6 Click Score: 17   End of Session Equipment Utilized During Treatment: Gait belt;Oxygen (2-6L) Nurse Communication: Mobility status;Precautions (O2 status)  Activity Tolerance: Patient tolerated treatment well Patient left: in chair;with call bell/phone within reach  OT Visit Diagnosis: Unsteadiness on feet (R26.81);Muscle weakness (generalized) (M62.81);Other symptoms and signs involving cognitive function;Other symptoms and signs involving the nervous system (R29.898);Low vision, both eyes (H54.2)                Time: 9043-8972 OT Time Calculation (min): 31 min Charges:  OT General Charges $OT Visit: 1 Visit OT Evaluation $OT Eval Moderate Complexity: 1 Mod  Leita DEL OTR/L Acute Rehabilitation Services Office: [phone]  Leita JINNY Odea 01/07/2024, 1:37 PM

## 2024-01-07 NOTE — Assessment & Plan Note (Deleted)
 Hgb dropped today, will repeat this PM ***

## 2024-01-07 NOTE — Hospital Course (Addendum)
 [name] [name] is a 43 y.o. year old with a history of ESRD on dialysis, HTN, hx CVA, hx seizure, lupus, antiphospholipid syndrome who presented with fever and dyspnea and was admitted to the [facility] Medicine Teaching Service for hypoxic respiratory failure and CAP.  Acute Hypoxic Respiratory Failure Community Acquired Pneumonia Volume overload Presented from HD, where he'd received vancomycin  and ceftazidime once, with concern for CAP given infectious symptoms and fever. Also with concern for volume overload with large volumes removed via dialysis throughout stay. On admission, he had O2 requirement with 4L [place] (no home O2 requirement); transitioned to room air on 7/25. Patient started on IV Ceftriaxone , doxycycline  to completed 5 day course (7/22-7/26). Given continued hypoxia with O2 requirements, CT PE done 7/22 which showed no PE. Suspicion for possible lupus flare contributing to symptoms given echo 7/22 with right atrial pressure of 15 mmHg, so lupus labs done which did not indicate flare. Concern for OSA contributing hypoxemia. Patient improved with dialysis and antibiotics.  Mitral Stenosis Echo done 7/22 showed moderate to severe mitral stenosis. Cardiology consulted, and recommended starting Toprol  25 mg daily and repeating echo prior to discharge when HR is down and he is less fluid overloaded. Repeat echo done 7/25 showed improvement to mild mitral stenosis. Cardiology recommended continuing Toprol  and continued routine outpatient follow up.  Anemia Hgb dropped from 10.5 to 8.5 on 7/22; remained stable following.  ESRD Patient on outpatient MWF dialysis schedule. Received extra dialysis on Tues 7/22 given fluid overloaded; kept on TTS schedule while inpatient. Returned to MWF schedule on 7/28.  Perianal condyloma Nonpainful, found this admission. Patient started on topical Imiquimod .   Other chronic conditions were medically managed with home medications and formulary alternatives as  necessary (Hx CVA, seizures, HTN, thrombocytopenia, antiphospholipid syndrome).  PCP Follow-up Recommendations: CT chest with contrast in 3-6 months to evaluate progressive mediastinal lymphadenopathy Outpatient rheumatology follow-up given CT changes above with known lupus Refer to pulmonology Place referral for sleep study (suspect OSA) Treatment of perianal condyloma: cryotherapy vs chemical; discharged on Imiquimod 

## 2024-01-08 DIAGNOSIS — I5032 Chronic diastolic (congestive) heart failure: Secondary | ICD-10-CM

## 2024-01-08 DIAGNOSIS — J189 Pneumonia, unspecified organism: Secondary | ICD-10-CM | POA: Diagnosis not present

## 2024-01-08 DIAGNOSIS — I639 Cerebral infarction, unspecified: Secondary | ICD-10-CM

## 2024-01-08 DIAGNOSIS — I10 Essential (primary) hypertension: Secondary | ICD-10-CM | POA: Diagnosis not present

## 2024-01-08 DIAGNOSIS — I342 Nonrheumatic mitral (valve) stenosis: Secondary | ICD-10-CM

## 2024-01-08 DIAGNOSIS — J9601 Acute respiratory failure with hypoxia: Secondary | ICD-10-CM | POA: Diagnosis not present

## 2024-01-08 LAB — CBC
HCT: 26.8 % — ABNORMAL LOW (ref 39.0–52.0)
Hemoglobin: 8.7 g/dL — ABNORMAL LOW (ref 13.0–17.0)
MCH: 33.5 pg (ref 26.0–34.0)
MCHC: 32.5 g/dL (ref 30.0–36.0)
MCV: 103.1 fL — ABNORMAL HIGH (ref 80.0–100.0)
Platelets: 80 K/uL — ABNORMAL LOW (ref 150–400)
RBC: 2.6 MIL/uL — ABNORMAL LOW (ref 4.22–5.81)
RDW: 16 % — ABNORMAL HIGH (ref 11.5–15.5)
WBC: 5.3 K/uL (ref 4.0–10.5)
nRBC: 0 % (ref 0.0–0.2)

## 2024-01-08 LAB — BASIC METABOLIC PANEL WITH GFR
Anion gap: 12 (ref 5–15)
BUN: 42 mg/dL — ABNORMAL HIGH (ref 6–20)
CO2: 26 mmol/L (ref 22–32)
Calcium: 8.5 mg/dL — ABNORMAL LOW (ref 8.9–10.3)
Chloride: 97 mmol/L — ABNORMAL LOW (ref 98–111)
Creatinine, Ser: 9.34 mg/dL — ABNORMAL HIGH (ref 0.61–1.24)
GFR, Estimated: 7 mL/min — ABNORMAL LOW (ref >60)
Glucose, Bld: 91 mg/dL (ref 70–99)
Potassium: 4.3 mmol/L (ref 3.5–5.1)
Sodium: 135 mmol/L (ref 135–145)

## 2024-01-08 LAB — HEPATITIS B SURFACE ANTIBODY, QUANTITATIVE: Hep B S AB Quant (Post): 3477 m[IU]/mL

## 2024-01-08 LAB — HIGH SENSITIVITY CRP: CRP, High Sensitivity: 98.11 mg/L — ABNORMAL HIGH (ref 0.00–3.00)

## 2024-01-08 MED ORDER — DOXYCYCLINE HYCLATE 100 MG PO TABS
100.0000 mg | ORAL_TABLET | Freq: Two times a day (BID) | ORAL | Status: DC
  Administered 2024-01-08 – 2024-01-12 (×8): 100 mg via ORAL
  Filled 2024-01-08 (×7): qty 1

## 2024-01-08 MED ORDER — IOHEXOL 350 MG/ML SOLN
75.0000 mL | Freq: Once | INTRAVENOUS | Status: AC | PRN
  Administered 2024-01-08: 75 mL via INTRAVENOUS

## 2024-01-08 MED ORDER — METOPROLOL SUCCINATE ER 25 MG PO TB24
25.0000 mg | ORAL_TABLET | Freq: Every day | ORAL | Status: DC
  Administered 2024-01-08 – 2024-01-12 (×4): 25 mg via ORAL
  Filled 2024-01-08 (×3): qty 1
  Filled 2024-01-08: qty 3
  Filled 2024-01-08 (×2): qty 1
  Filled 2024-01-08: qty 3

## 2024-01-08 MED ORDER — ASPIRIN 81 MG PO CHEW
81.0000 mg | CHEWABLE_TABLET | Freq: Every day | ORAL | Status: DC
  Administered 2024-01-08 – 2024-01-13 (×6): 81 mg via ORAL
  Filled 2024-01-08 (×5): qty 1

## 2024-01-08 MED ORDER — LEVETIRACETAM 500 MG PO TABS
500.0000 mg | ORAL_TABLET | ORAL | Status: DC
  Administered 2024-01-09: 500 mg via ORAL

## 2024-01-08 NOTE — Assessment & Plan Note (Addendum)
 Mod-severe mitral stenosis on echo 7/22. - Cardiology consulted, appreciate recommendations

## 2024-01-08 NOTE — Progress Notes (Signed)
 Patient refused labs. Notified provider.

## 2024-01-08 NOTE — Assessment & Plan Note (Signed)
 Hgb of 8.7 today from 9.2 yesterday, stable. - Transfusion threshold <7 - Continue to monitor

## 2024-01-08 NOTE — Plan of Care (Signed)

## 2024-01-08 NOTE — Consult Note (Signed)
 Cardiology Consultation   Patient ID: [name] [name] MRN: [id number]; DOB: [birth year]  Admit date: 01/06/2024 Date of Consult: 01/08/2024  PCP:  Theotis Haze ORN, NP   [facility] Providers Cardiologist: New (Dr. Lonni)   Patient Profile: [name] [name] is a 43 y.o. male with a history of recurrent CVA (most recently in 10/2023), hypertension, ESRD secondary to lupus nephritis on hemodialysis on M/W/F, lupus, seizure disorder with multiple admissions for break through seizures felt to be due to medication non-compliance who is being seen 01/08/2024 for the evaluation of mitral stenosis at the request of Dr. Delores.  History of Present Illness: [name] [name] is a 43 year old male with the above history.  He was previously seen cardiology at [facility] in 2021 during workup for renal transplant. Echo at that time showed normal LVEF with moderate LVH, normal RV function, and no significant valvular disease.  Stress Echo was normal.  He has not followed routinely with Cardiology since that time.   Patient he had a prolonged hospitalization in 10/2023 for acute CVA after presenting with breakthrough seizure and acute encephalopathy. Echo showed LVEF of 65-70% with moderate LVH and grade 1 diastolic function, normal RV function, calcified and restricted mitral valve (possible rheumatic disease) with mild MR/ moderate MS.  Carotid Dopplers to mild (1-39%) stenosis of bilateral ICAs.  CVA was felt to likely be embolic in nature given history of antiphospholipid syndrome and noncompliance with Eliquis  at home.  Patient presented to the ED on 01/06/2024 from dialysis for fever (101.3), chills, nonproductive cough.  He is started on IV antibiotics at dialysis and upon EMS arrival was noted to be hypoxic with O2 sat of 76.  He was found to have pneumonia but was also noted to be significantly volume overloaded.  Be elevated at 846. Chest x-ray showed mild pulmonary edema.  Chest CTA was negative for PE  but showed chronic mediastinal lymphadenopathy which are favored to be reactive nodes although low-grade lymphoproliferative disorder cannot be ruled out.  Echo showed LVEF of 65-70% with severe concentric LVH and accelerated LVOT flow with an aliased intracavitary gradient noted (LVOT gradient 20 mmHg at rest and 18 mmHg with Valsalva), moderately enlarged RV with normal RV function, moderate to severe mitral stenosis (mean gradient 12.0 mmHg). Cardiology consulted for further evaluation.  Patient states he was in his usual state of health until 01/06/2024 when he started having fevers, chills, and mild shortness of breath.  Prior to this, he denies having any dyspnea.  No chest pain, orthopnea, PND, palpitations, lightheadedness, dizziness, syncope.  He does report some chronic lower extremity but states this is been getting better. He does have a history of being non-compliant with hemodialysis but states he has not missed any session lately.   Past Medical History:  Diagnosis Date   ESRD on hemodialysis (HCC)    Hypertension    Lupus    Seizures (HCC)    Seizures (HCC)    Stroke (HCC)    21 or 43 years of age.     Wears glasses     Past Surgical History:  Procedure Laterality Date   AV FISTULA PLACEMENT Left 12/30/2017   Procedure: ARTERIOVENOUS (AV) FISTULA CREATION VERSUS INSERTION OF ARTERIOVENOUS GRAFT LEFT ARM;  Surgeon: Oris Krystal FALCON, MD;  Location: [facility] OR;  Service: Vascular;  Laterality: Left;   AV FISTULA PLACEMENT Right 12/24/2023   Procedure: BRACHIOBASCILIC ARTERIOVENOUS (AV) FISTULA CREATION;  Surgeon: Sheree Penne Lonni, MD;  Location: [facility] OR;  Service: Vascular;  Laterality: Right;   BASCILIC VEIN TRANSPOSITION Left 08/15/2018   Procedure: LEFT FIRST STAGE BASCILIC VEIN TRANSPOSITION;  Surgeon: Oris Krystal FALCON, MD;  Location: [facility] OR;  Service: Vascular;  Laterality: Left;   BASCILIC VEIN TRANSPOSITION Left 10/30/2018   Procedure: INSERTION OF GORE-TEX GRAFT LEFT UPPER ARM;   Surgeon: Sheree Penne Bruckner, MD;  Location: [facility] OR;  Service: Vascular;  Laterality: Left;   INSERTION OF DIALYSIS CATHETER N/A 09/20/2018   Procedure: INSERTION OF TUNNELED DIALYSIS CATHETER RIGHT INTERNAL JUGULAR;  Surgeon: Gretta Bruckner PARAS, MD;  Location: [facility] OR;  Service: Vascular;  Laterality: N/A;   IR FLUORO GUIDE CV LINE RIGHT  07/16/2022   IR US  GUIDE VASC ACCESS RIGHT  07/16/2022   RENAL BIOPSY       Home Medications:  Prior to Admission medications   Medication Sig Start Date End Date Taking? Authorizing Provider  acetaminophen  (TYLENOL ) 325 MG tablet Take 2 tablets (650 mg total) by mouth every 6 (six) hours as needed for mild pain. 02/13/23  Yes Danton Reyes DASEN, MD  apixaban  (ELIQUIS ) 5 MG TABS tablet Take 1 tablet (5 mg total) by mouth 2 (two) times daily. 11/12/23  Yes Gonfa, Taye T, MD  atorvastatin  (LIPITOR) 40 MG tablet Take 1 tablet (40 mg total) by mouth daily. 08/03/23  Yes Darci Pore, MD  AURYXIA  1 GM 210 MG(Fe) tablet Take 840 mg by mouth 3 (three) times daily with meals. 10/07/23  Yes [provider]  divalproex  (DEPAKOTE ) 500 MG DR tablet Take 2 tablets (1,000 mg total) by mouth every 12 (twelve) hours. 11/12/23  Yes Gonfa, Taye T, MD  levETIRAcetam  (KEPPRA ) 500 MG tablet Take 1 tablet (500 mg total) by mouth every Monday, Wednesday, and Friday with hemodialysis. Patient taking differently: Take 500 mg by mouth See admin instructions. Give 1 tablet (500mg ) twice daily on Tuesday, Thursday and Saturday and give 1 tablet (500mg ) by mouth at 1300 on Monday, Wednesday and Friday. 11/13/23  Yes Gonfa, Taye T, MD  losartan (COZAAR) 50 MG tablet Take 50 mg by mouth daily.   Yes [provider]  Multiple Vitamins-Minerals (MULTIVITAMIN WITH MINERALS) tablet Take 1 tablet by mouth daily.   Yes [provider]  oxyCODONE -acetaminophen  (PERCOCET) 5-325 MG tablet Take 1 tablet by mouth every 6 (six) hours as needed for severe pain (pain score  7-10). 12/24/23  Yes Rhyne, Samantha J, PA-C  calcitRIOL  (ROCALTROL ) 0.5 MCG capsule Take 5 capsules (2.5 mcg total) by mouth every Monday, Wednesday, and Friday with hemodialysis. Patient not taking: Reported on 01/07/2024 11/13/23   Gonfa, Taye T, MD  cinacalcet  (SENSIPAR ) 30 MG tablet Take 5 tablets (150 mg total) by mouth every Monday, Wednesday, and Friday with hemodialysis. Patient not taking: Reported on 01/07/2024 11/13/23   Gonfa, Taye T, MD    Scheduled Meds:  apixaban   5 mg Oral BID   aspirin   81 mg Oral Daily   atorvastatin   40 mg Oral Daily   calcitRIOL   2.5 mcg Oral Q M,W,F-HD   cinacalcet   150 mg Oral Q M,W,F-HD   divalproex   1,000 mg Oral Q12H   doxycycline   100 mg Oral Q12H   feeding supplement (NEPRO CARB STEADY)  237 mL Oral BID BM   ferric citrate   840 mg Oral TID WC   levETIRAcetam   500 mg Oral BID   levETIRAcetam   500 mg Oral Once per day on Monday Wednesday Friday   sodium chloride  flush  10-40 mL Intracatheter Q12H   Continuous Infusions:  cefTRIAXone  (ROCEPHIN )  IV 2 g (01/08/24 1216)   PRN Meds: acetaminophen , oxyCODONE -acetaminophen , sodium chloride  flush  Allergies:    Allergies  Allergen Reactions   Cetirizine & Related Swelling   Hibiclens  [Chlorhexidine  Gluconate] Itching    Social History:   Social History   Socioeconomic History   Marital status: Single    Spouse name: Not on file   Number of children: 0   Years of education: Not on file   Highest education level: High school graduate  Occupational History   Not on file  Tobacco Use   Smoking status: Every Day    Types: E-cigarettes   Smokeless tobacco: Never  Vaping Use   Vaping status: Some Days  Substance and Sexual Activity   Alcohol use: Not Currently   Drug use: No   Sexual activity: Not Currently  Other Topics Concern   Not on file  Social History Narrative   ** Merged History Encounter **       ** Merged History Encounter **       Lives with a friend Right  handed Caffeine: rare   Social Drivers of Corporate investment banker Strain: Low Risk  (03/26/2023)   Overall Financial Resource Strain (CARDIA)    Difficulty of Paying Living Expenses: Not hard at all  Food Insecurity: No Food Insecurity (01/06/2024)   Hunger Vital Sign    Worried About Running Out of Food in the Last Year: Never true    Ran Out of Food in the Last Year: Never true  Transportation Needs: No Transportation Needs (01/06/2024)   PRAPARE - Administrator, Civil Service (Medical): No    Lack of Transportation (Non-Medical): No  Physical Activity: Inactive (03/26/2023)   Exercise Vital Sign    Days of Exercise per Week: 0 days    Minutes of Exercise per Session: 0 min  Stress: No Stress Concern Present (03/26/2023)   [employer] of Occupational Health - Occupational Stress Questionnaire    Feeling of Stress : Not at all  Social Connections: Patient Declined (01/06/2024)   Social Connection and Isolation Panel    Frequency of Communication with Friends and Family: Patient declined    Frequency of Social Gatherings with Friends and Family: Patient declined    Attends Religious Services: Patient declined    Database administrator or Organizations: Patient declined    Attends Banker Meetings: Patient declined    Marital Status: Patient declined  Recent Concern: Social Connections - Moderately Isolated (10/21/2023)   Social Connection and Isolation Panel    Frequency of Communication with Friends and Family: More than three times a week    Frequency of Social Gatherings with Friends and Family: Three times a week    Attends Religious Services: 1 to 4 times per year    Active Member of Clubs or Organizations: No    Attends Banker Meetings: Never    Marital Status: Never married  Intimate Partner Violence: Not At Risk (01/06/2024)   Humiliation, Afraid, Rape, and Kick questionnaire    Fear of Current or Ex-Partner: No    Emotionally  Abused: No    Physically Abused: No    Sexually Abused: No    Family History:   Family History  Problem Relation Age of Onset   Diabetes Mother    Diabetes Maternal Grandmother    Seizures Neg Hx        not on mother's side. he doesn't know about his  father's side     ROS:  Please see the history of present illness.   Physical Exam/Data: Vitals:   01/07/24 1941 01/07/24 2300 01/08/24 0816 01/08/24 1207  BP: (!) 134/95 131/79 (!) 140/91 (!) 137/91  Pulse: 79 81 76 73  Resp: 20 18 18 18   Temp: 98.5 F (36.9 C) (!) 97.1 F (36.2 C) 98.4 F (36.9 C) 97.6 F (36.4 C)  TempSrc: Oral  Oral Oral  SpO2: 100% 96% (!) 86% 94%  Weight:      Height:        Intake/Output Summary (Last 24 hours) at 01/08/2024 1316 Last data filed at 01/08/2024 1038 Gross per 24 hour  Intake 827.7 ml  Output 3500 ml  Net -2672.3 ml      01/07/2024    5:43 PM 01/07/2024    1:59 PM 01/06/2024   12:51 PM  Last 3 Weights  Weight (lbs) 285 lb 7.9 oz 294 lb 8.6 oz 242 lb 8.1 oz  Weight (kg) 129.5 kg 133.6 kg 110 kg     Body mass index is 37.67 kg/m.  General: 43 y.o. obese African-American male resting comfortably in no acute distress. HEENT: Normocephalic and atraumatic. Sclera clear.  Neck: Supple. JVD elevated. Heart: RRR. Distinct S1 and S2. No murmurs, gallops, or rubs.  Lungs: No increased work of breathing. Decreased breath sounds bilaterally with faint crackles in bases.  Abdomen: Soft, non-distended, and non-tender to palpation.  MSK: Normal strength and tone for age. Extremities: Mostly non-pitting edema of bilateral lower extremities. Bilateral upper extremities look edematous as well.   Skin: Warm and dry. Neuro: Alert and oriented x3. No focal deficits. Psych: Normal affect. Responds appropriately.  EKG:  The EKG was personally reviewed and demonstrates:  Normal sinus rhythm with no acute ischemic changes. Telemetry:  Not on telemetry.  Relevant CV Studies:  Echocardiogram with  Bubble Study 10/28/2023: Impressions: 1. Left ventricular ejection fraction, by estimation, is 65 to 70%. The  left ventricle has normal function. The left ventricle has no regional  wall motion abnormalities. There is moderate concentric left ventricular  hypertrophy. Left ventricular  diastolic parameters are consistent with Grade I diastolic dysfunction  (impaired relaxation).   2. Right ventricular systolic function is normal. The right ventricular  size is normal. Tricuspid regurgitation signal is inadequate for assessing  PA pressure.   3. Left atrial size was mildly dilated.   4. The mitral valve is calcified and restricted, possible rheumatic  disease. Mild mitral valve regurgitation. Moderate mitral stenosis with  mean gradient 5 mmHg, MVA 1.28 cm^2 by VTI and 1.38 cm^2 by PHT.   5. The aortic valve is tricuspid. There is mild calcification of the  aortic valve. Aortic valve regurgitation is not visualized. No aortic  stenosis is present.   6. The inferior vena cava is normal in size with greater than 50%  respiratory variability, suggesting right atrial pressure of 3 mmHg.   7. Bubble study was unsuccessful.  _______________  Echocardiogram 01/07/2024: Impressions: 1. LVOT Flow accleration noted. There is an aliased intracavitary  gradient noted (not LVOT). LVOT gradient 20 mm Hg (resting). Valsalva  gradient 18 mm Hg . Left ventricular ejection fraction, by estimation, is  65 to 70%. The left ventricle has normal  function. The left ventricle has no regional wall motion abnormalities.  There is severe concentric left ventricular hypertrophy. Left ventricular  diastolic parameters are indeterminate.   2. Right ventricular systolic function is normal. The right ventricular  size is  moderately enlarged.   3. Left atrial size was moderately dilated.   4. The mitral valve is degenerative. No evidence of mitral valve  regurgitation. Moderate to severe mitral stenosis. The mean  mitral valve  gradient is 12.0 mmHg.   5. The aortic valve was not well visualized. There is mild calcification  of the aortic valve. Aortic valve regurgitation is not visualized. No  aortic stenosis is present.   6. The inferior vena cava is dilated in size with <50% respiratory  variability, suggesting right atrial pressure of 15 mmHg.   Comparison(s): Prior images reviewed side by side. Mitral gradient has  increase dramatically but with notablly elevated heart rates.   Conclusion(s)/Recommendation(s): Constellation of significant hypertrophy  and calcified valve greater than expected for age can be seen in ESRD. In  lieu of this, consider additional imaging.    Laboratory Data: High Sensitivity Troponin:  No results for input(s): TROPONINIHS in the last 720 hours.   Chemistry Recent Labs  Lab 01/06/24 1553 01/07/24 0331 01/08/24 0300  NA 137 135 135  K 4.4 4.6 4.3  CL 95* 96* 97*  CO2 26 25 26   GLUCOSE 81 78 91  BUN 40* 51* 42*  CREATININE 9.52* 11.66* 9.34*  CALCIUM  8.2* 8.0* 8.5*  GFRNONAA 6* 5* 7*  ANIONGAP 16* 14 12    Recent Labs  Lab 01/06/24 1553  PROT 7.3  ALBUMIN  3.2*  AST 19  ALT 8  ALKPHOS 66  BILITOT 0.7   Lipids No results for input(s): CHOL, TRIG, HDL, LABVLDL, LDLCALC, CHOLHDL in the last 168 hours.  Hematology Recent Labs  Lab 01/07/24 0331 01/07/24 2039 01/08/24 0300  WBC 6.5 5.8 5.3  RBC 2.50* 2.79* 2.60*  HGB 8.5* 9.2* 8.7*  HCT 25.6* 28.7* 26.8*  MCV 102.4* 102.9* 103.1*  MCH 34.0 33.0 33.5  MCHC 33.2 32.1 32.5  RDW 17.2* 16.4* 16.0*  PLT 72* 81* 80*   Thyroid No results for input(s): TSH, FREET4 in the last 168 hours.  BNP Recent Labs  Lab 01/06/24 1553  BNP 846.1*    DDimer No results for input(s): DDIMER in the last 168 hours.  Radiology/Studies:  CT Angio Chest Pulmonary Embolism (PE) W or WO Contrast Result Date: 01/08/2024 EXAM: CTA CHEST 01/08/2024 12:02:50 AM TECHNIQUE: CTA of the chest was  performed after the administration of 75mL of intravenous iohexol  (OMNIPAQUE ) 350 MG/ML injection. Multiplanar reformatted images are provided for review. MIP images are provided for review. Automated exposure control, iterative reconstruction, and/or weight based adjustment of the mA/kV was utilized to reduce the radiation dose to as low as reasonably achievable. COMPARISON: CT chest dated 11/08/2022. CLINICAL HISTORY: Pulmonary embolism (PE) suspected, low to intermediate prob, neg D-dimer. FINDINGS: PULMONARY ARTERIES: Pulmonary arteries are adequately opacified for evaluation. No evidence of pulmonary embolism. MEDIASTINUM: Chronic mediastinal lymphadenopathy, although mildly progressive, measuring up to 2.4 cm in the right paratracheal region (image 41) with preservation of the normal fatty hilum, previously 1.3 cm. No suspicious axillary lymphadenopathy. LUNGS AND PLEURA: Mozella attenuation / ground glass opacity in the lungs bilaterally, favoring scattered atelectasis due to expiratory imaging. No evidence of pleural effusion or pneumothorax. UPPER ABDOMEN: Spleen is normal in size. SOFT TISSUES AND BONES: Mild degenerative changes of the lower thoracic spine. Very mild thoracic atherosclerosis. Mild coronary atherosclerosis of the LAD. IMPRESSION: 1. No evidence of pulmonary embolism. 2. Chronic mediastinal lymphadenopathy, mildly progressive. Reactive nodes are favored, although low grade lymphoproliferative disorder is technically possible. Consider follow-up CT chest with contrast  in 3-6 months. Electronically signed by: Pinkie Pebbles MD 01/08/2024 12:19 AM EDT RP Workstation: [id number]   ECHOCARDIOGRAM COMPLETE Result Date: 01/07/2024    ECHOCARDIOGRAM REPORT   Patient Name:   [name] [name] Date of Exam: 01/07/2024 Medical Rec #:  [id number]      Height:       73.0 in Accession #:    [id number]     Weight:       242.5 lb Date of Birth:  [birth year]     BSA:          2.335 m Patient Age:    42 years        BP:           156/98 mmHg Patient Gender: M              HR:           93 bpm. Exam Location:  Inpatient Procedure: 2D Echo, Color Doppler and Cardiac Doppler (Both Spectral and Color            Flow Doppler were utilized during procedure). Indications:    Dyspnea  History:        Patient has prior history of Echocardiogram examinations. Risk                 Factors:Hypertension.  Sonographer:    CM Referring Phys: [id number] DONALD HERO RUMBALL IMPRESSIONS  1. LVOT Flow accleration noted. There is an aliased intracavitary gradient noted (not LVOT). LVOT gradient 20 mm Hg (resting). Valsalva gradient 18 mm Hg . Left ventricular ejection fraction, by estimation, is 65 to 70%. The left ventricle has normal function. The left ventricle has no regional wall motion abnormalities. There is severe concentric left ventricular hypertrophy. Left ventricular diastolic parameters are indeterminate.  2. Right ventricular systolic function is normal. The right ventricular size is moderately enlarged.  3. Left atrial size was moderately dilated.  4. The mitral valve is degenerative. No evidence of mitral valve regurgitation. Moderate to severe mitral stenosis. The mean mitral valve gradient is 12.0 mmHg.  5. The aortic valve was not well visualized. There is mild calcification of the aortic valve. Aortic valve regurgitation is not visualized. No aortic stenosis is present.  6. The inferior vena cava is dilated in size with <50% respiratory variability, suggesting right atrial pressure of 15 mmHg. Comparison(s): Prior images reviewed side by side. Mitral gradient has increase dramatically but with notablly elevated heart rates. Conclusion(s)/Recommendation(s): Constellation of significant hypertrophy and calcified valve greater than expected for age can be seen in ESRD. In lieu of this, consider additional imaging. FINDINGS  Left Ventricle: LVOT Flow accleration noted. There is an aliased intracavitary gradient noted (not LVOT).  LVOT gradient 20 mm Hg (resting). Valsalva gradient 18 mm Hg. Left ventricular ejection fraction, by estimation, is 65 to 70%. The left ventricle has normal function. The left ventricle has no regional wall motion abnormalities. The left ventricular internal cavity size was normal in size. There is severe concentric left ventricular hypertrophy. Left ventricular diastolic parameters are indeterminate. Right Ventricle: The right ventricular size is moderately enlarged. No increase in right ventricular wall thickness. Right ventricular systolic function is normal. Left Atrium: Left atrial size was moderately dilated. Right Atrium: Right atrial size was normal in size. Pericardium: There is no evidence of pericardial effusion. Mitral Valve: The mitral valve is degenerative in appearance. No evidence of mitral valve regurgitation. Moderate to severe mitral valve stenosis. MV peak gradient, 21.9 mmHg. The mean mitral  valve gradient is 12.0 mmHg with average heart rate of 95 bpm. Tricuspid Valve: The tricuspid valve is normal in structure. Tricuspid valve regurgitation is not demonstrated. No evidence of tricuspid stenosis. Aortic Valve: The aortic valve was not well visualized. There is mild calcification of the aortic valve. Aortic valve regurgitation is not visualized. No aortic stenosis is present. Aortic valve mean gradient measures 8.0 mmHg. Aortic valve peak gradient  measures 16.8 mmHg. Aortic valve area, by VTI measures 3.15 cm. Pulmonic Valve: The pulmonic valve was normal in structure. Pulmonic valve regurgitation is not visualized. No evidence of pulmonic stenosis. Aorta: The aortic root and ascending aorta are structurally normal, with no evidence of dilitation. Venous: The inferior vena cava is dilated in size with less than 50% respiratory variability, suggesting right atrial pressure of 15 mmHg. IAS/Shunts: The atrial septum is grossly normal.  LEFT VENTRICLE PLAX 2D LVIDd:         4.80 cm   Diastology  LVIDs:         3.00 cm   LV e' medial:  5.35 cm/s LV PW:         1.80 cm   LV e' lateral: 6.22 cm/s LV IVS:        2.50 cm LVOT diam:     2.40 cm LV SV:         124 LV SV Index:   53 LVOT Area:     4.52 cm  RIGHT VENTRICLE             IVC RV S prime:     20.00 cm/s  IVC diam: 2.10 cm TAPSE (M-mode): 2.7 cm LEFT ATRIUM              Index        RIGHT ATRIUM           Index LA Vol (A2C):   107.0 ml 45.82 ml/m  RA Area:     15.40 cm LA Vol (A4C):   86.5 ml  37.04 ml/m  RA Volume:   35.80 ml  15.33 ml/m LA Biplane Vol: 102.0 ml 43.68 ml/m  AORTIC VALVE AV Area (Vmax):    3.18 cm AV Area (Vmean):   3.07 cm AV Area (VTI):     3.15 cm AV Vmax:           205.00 cm/s AV Vmean:          131.000 cm/s AV VTI:            0.393 m AV Peak Grad:      16.8 mmHg AV Mean Grad:      8.0 mmHg LVOT Vmax:         144.00 cm/s LVOT Vmean:        88.900 cm/s LVOT VTI:          0.274 m LVOT/AV VTI ratio: 0.70  AORTA Ao Root diam: 3.10 cm Ao Asc diam:  3.40 cm MITRAL VALVE MV Area VTI:  2.53 cm    SHUNTS MV Peak grad: 21.9 mmHg   Systemic VTI:  0.27 m MV Mean grad: 12.0 mmHg   Systemic Diam: 2.40 cm MV Vmax:      2.34 m/s MV Vmean:     153.5 cm/s Stanly Leavens MD Electronically signed by Stanly Leavens MD Signature Date/Time: 01/07/2024/10:51:31 AM    Final    DG Chest 2 View Result Date: 01/06/2024 CLINICAL DATA:  Questionable sepsis. EXAM: CHEST - 2 VIEW COMPARISON:  08/02/2023. FINDINGS: Right  IJ dialysis catheter terminates in the high right atrium. Heart is enlarged, stable. Lungs are low in volume with next interstitial and airspace opacification. No pleural fluid. IMPRESSION: Mild pulmonary edema. Electronically Signed   By: Newell Eke M.D.   On: 01/06/2024 14:39     Assessment and Plan:  Moderate to Severe Mitral Stenosis Patient was admitted for acute hypoxic respiratory failure secondary to pneumonia and was also noted to be significantly volume overloaded.  Echo showed LVEF of 85-70% with  degenerative mitral valve and moderate to severe mitral stenosis(mean gradient of 12 mmHg with average heart rate of 95 bpm).  Prior echo in 10/2023 mention a calcified and restricted mitral (possible rheumatic disease) with moderate mitral stenosis (mean gradient 5 mmHg but average heart rate was 76 bpm at that time). He denies any history of rheumatic fever. - Does not seem to be symptomatic with this. - Higher gradient may be due to higher heart rates.  - Will start Toprol -XL 25mg  daily to try to lower his rate. - Can repeat limited TTE prior to discharge once rates are slower and he is more euvolemic.  - Would hold off on elective TEE at this time in setting of pneumonia. Could consider further outpatient work-up if mitral valve gradients remain elevated despite slower heart rates.  Chronic Diastolic CHF Severe LVH Patient was noted to be markedly volume overloaded on admission. BNP elevated in the 800s. Chest x-ray showed mild pulmonary edema. Echo this admission shows 65-70% with severe concentric LVH and accelerated LVOT flow with an aliased intracavitary gradient noted (LVOT gradient 20 mmHg at rest and 18 mmHg with Valsalva). He likely does have a component of diastolic CHF but suspect main contributor here is ESRD. He has a history of non-compliance with hemodialysis but denies missing any sessions recently. He states he typically has 5L of fluid removed during dialysis. - Still volume overload on exam. - Volume status being manages with dialysis.  - Will start Toprol -XL 25mg  daily. - Can consider outpatient cardiac MRI for further evaluation of severe LVH after patient recovers from current illness if he keeps follow-up visits (per chart review, it looks like he has a history of non-compliance and no-showing appointments).  Hypertension BP mildly elevated at times but mostly well controlled. - Will start Toprol -XL 25mg  daily as above to help slow his heart rate down in setting of mitral  stenosis.  ESRD Secondary to Lupus Nephritis On dialysis M/W/F. - Management per Nephrology.  History of Recurrent History of remote CVA in early 2000s and then recent CVA in 10/2023. Most recent CVA was felt to be be embolic in nature given history of antiphospholipid syndrome and noncompliance with Eliquis  at home. However, he did not undergo a TEE at that time. - Continue Aspirin  81mg  daily and Eliquis  5mg  twice daily. - Continue Lipitor 40mg  daily.  Otherwise, per primary team: - Lupus - Seizure disorder - Anemia of chronic disease - Thrombocytopenia    For questions or updates, please contact [facility] Please consult [email or web address] for contact info under    Signed, Carlia Bomkamp E Sherby Moncayo, PA-C  01/08/2024 1:16 PM

## 2024-01-08 NOTE — Plan of Care (Signed)
 FMTS Interim Progress Note  S: Went to see [name] [name] after being informed by attending that he had been more sleepy earlier today. He was awake for me throughout visit. He reports he has been sleepier in general during his admission given the hospital setting. He denies increased difficulty breathing or feeling short of breath.   Of note, does report taking his seizures medications (Keppra  and Depakote ) twice a day on all days, including dialysis days. Prior discharge summary recommending Keppra  twice daily and another dose on dialysis days post-dialysis.  O: BP 126/77 (BP Location: Left Wrist)   Pulse 79   Temp (!) 97.4 F (36.3 C) (Oral)   Resp 19   Ht 6' 1 (1.854 m)   Wt 129.5 kg   SpO2 90%   BMI 37.67 kg/m   Physical Exam: General: Patient seated in recliner, watching television. Respiratory: Normal work of breathing on 4L humidified O2 via [place]. Clear to auscultation bilaterally; no wheezes, crackles. Neuro: Alert and oriented to person, place, time, event. Appropriately responding to questions.   A/P: Acute Hypoxic Respiratory Failure Patient alert and not appearing sleepy at this time. - No indication for VBG at this time - Continue current treatments - CRP did come back elevated at 98.11, consider need for steroids - Lupus labs pending for tomorrow - Dialysis tomorrow per nephro note  Hx Seizures Will continue patient on previously prescribed seizure medication regimen: - Depakote  1000 mg twice daily - Keppra  500 mg twice daily with additional 500 mg on dialysis days after dialysis  [name] Palma, MD 01/08/2024, 5:18 PM PGY-1, [facility] pager [phone]

## 2024-01-08 NOTE — Assessment & Plan Note (Addendum)
-   Avoid QT prolonging medications - Repeat EKG today following dialysis

## 2024-01-08 NOTE — Progress Notes (Signed)
 Daily Progress Note Intern Pager: [phone]  Patient name: [name] [name] Medical record number: [id number] Date of birth: 1981/04/30 Age: 43 y.o. Gender: male  Primary Care Provider: Theotis [name] ORN, NP Consultants: Nephrology, cardiology Code Status: FULL  Pt Overview and Major Events to Date:  - 7/21: Admitted, nephrology consulted for dialysis - 7/23: Cardiology consulted for echo findings  Medical Decision Making:  [name] [name] is a 43 y.o. male presenting with fever and dyspnea 2/2 possible CAP, likely also with volume overload contributing. Pt was 20kg over dry weight following HD Monday per nephro note. Got 3.5L of fluid out at dialysis yesterday 7/22. CT PE negative for PE. MRSA nares negative. Will continue to treat with dialysis + fluid removal and antibiotics. Could also consider lupus flare contributing to dyspnea with pulmonary hypertension; 15 mmHg right atrial pressure per echo done 7/22. Will investigate further with labs.  Pertinent PMH/PSH includes ESRD on dialysis, HTN, hx CVA, hx seizure, lupus, antiphospholipid syndrome. Assessment & Plan Acute hypoxic respiratory failure (HCC) Pneumonia Volume overload Remains on O2 supplementation with 4L via [place]. - Humidifying O2 given nose bleeds - Abx: IV Ceftriaxone , doxycycline  (7/22-) - Will switch doxy to PO - Nephro consulting for volume management - Will get additional labs to workup potential lupus flare: C3, C4, dsDNA - Pain: Tylenol  650 mg q6h PRN, oxycodone -acetaminophen  5-325 q6h PRN - If patient using more frequently, consider adjusting frequency vs dose - Labs: AM CBC, RFP, Mag - Blood cultures: No growth 2 days; continue to follow - CRP pending - Wean O2 as tolerated - Continuous pulse ox - PT/OT eval and treat Mitral stenosis Mod-severe mitral stenosis on echo 7/22. - Cardiology consulted, appreciate recommendations Anemia Hgb of 8.7 today from 9.2 yesterday, stable. - Transfusion threshold  <7 - Continue to monitor ESRD (end stage renal disease) [facility]) Outpatient dialysis schedule MWF; extra dialysis yest Tues 7/22. - Continue home calcitriol , Auryxia , Sensipar  - Appreciate nephrology recommendations Prolonged Q-T interval on ECG - Avoid QT prolonging medications - Repeat EKG today following dialysis Chronic health problem Antiphospholipid syndrome  History of CVA - On Eliquis  5 mg twice daily since 2019 (previously noncompliant on warfarin), not on aspirin . On speaking with patient, reports has been taking aspirin  81 mg daily; will resume Seizures - Home Keppra , home Depakote  (Keppra  500 mg twice daily and 500 mg MWF after HD and Depakote  1000 mg every 12 hours for seizure prophylaxis) Clarified timing with patient today but lingering questions about timing of medications in patient on dialysis, will double-back to re-confirm Hypertension - Holding home losartan 50 mg daily; consider restarting post procedure vs at discharge Thrombocytopenia - Chronic, suspect 2/2 antiepileptics and/or reactive infectious etiology   FEN/GI: Renal diet PPx: Eliquis  5 mg twice daily Dispo:SNF pending clinical improvement  per PT; will reassess once no longer with O2 requirements. Barriers include weaning O2, continued need for IV abx.   Subjective:  Patient is doing well this morning with no concerns other than some mild nose bleeding from use of O2 supplementation. Breathing is fine.  Objective: Temp:  [97.1 F (36.2 C)-99.3 F (37.4 C)] 97.1 F (36.2 C) (07/22 2300) Pulse Rate:  [76-94] 81 (07/22 2300) Resp:  [6-20] 18 (07/22 2300) BP: (106-143)/(66-95) 131/79 (07/22 2300) SpO2:  [96 %-100 %] 96 % (07/22 2300) FiO2 (%):  [4 %] 4 % (07/22 2300) Weight:  [129.5 kg-133.6 kg] 129.5 kg (07/22 1743) Physical Exam: General: Patient seated in recliner, no acute distress.  Cardiovascular: Regular rate and rhythm, no murmurs/rubs/gallops. Respiratory: Normal work of breathing on 4L [place]. Clear  to auscultation bilaterally; no wheezes, crackles. Abdomen: Bowel sounds present and normoactive bilaterally. Soft, nondistended, nontender. Extremities: Skin warm, dry. Nonpitting edema of bilateral lower extremities with overlying skin changes (dry skin) Neuro: Alert and appropriately responding to questions.  Laboratory: Most recent CBC Lab Results  Component Value Date   WBC 5.3 01/08/2024   HGB 8.7 (L) 01/08/2024   HCT 26.8 (L) 01/08/2024   MCV 103.1 (H) 01/08/2024   PLT 80 (L) 01/08/2024   Most recent BMP    Latest Ref Rng & Units 01/08/2024    3:00 AM  BMP  Glucose 70 - 99 mg/dL 91   BUN 6 - 20 mg/dL 42   Creatinine 9.38 - 1.24 mg/dL 0.65   Sodium 864 - 854 mmol/L 135   Potassium 3.5 - 5.1 mmol/L 4.3   Chloride 98 - 111 mmol/L 97   CO2 22 - 32 mmol/L 26   Calcium  8.9 - 10.3 mg/dL 8.5     CT PE: No PE; chronic mediastinal lymphadenopathy, mildly progressive    [name] Palma, MD 01/08/2024, 7:03 AM  PGY-1, [facility] FPTS Intern pager: [phone], text pages welcome Secure chat group [facility] Teaching Service

## 2024-01-08 NOTE — Assessment & Plan Note (Addendum)
 Remains on O2 supplementation with 4L via [place]. - Humidifying O2 given nose bleeds - Abx: IV Ceftriaxone , doxycycline  (7/22-) - Will switch doxy to PO - Nephro consulting for volume management - Will get additional labs to workup potential lupus flare: C3, C4, dsDNA - Pain: Tylenol  650 mg q6h PRN, oxycodone -acetaminophen  5-325 q6h PRN - If patient using more frequently, consider adjusting frequency vs dose - Labs: AM CBC, RFP, Mag - Blood cultures: No growth 2 days; continue to follow - CRP pending - Wean O2 as tolerated - Continuous pulse ox - PT/OT eval and treat

## 2024-01-08 NOTE — Assessment & Plan Note (Addendum)
 Outpatient dialysis schedule MWF; extra dialysis yest Tues 7/22. - Continue home calcitriol , Auryxia , Sensipar  - Appreciate nephrology recommendations

## 2024-01-08 NOTE — Progress Notes (Signed)
 Mobility Specialist: Progress Note   01/08/24 1522  Mobility  Activity Ambulated with assistance in hallway  Level of Assistance Contact guard assist, steadying assist  Assistive Device None  Distance Ambulated (ft) 90 ft  Activity Response Tolerated well  Mobility Referral Yes  Mobility visit 1 Mobility  Mobility Specialist Start Time (ACUTE ONLY) 0948  Mobility Specialist Stop Time (ACUTE ONLY) 1005  Mobility Specialist Time Calculation (min) (ACUTE ONLY) 17 min    Pre Mobility: SpO2 100% 4LO2 During Mobility: SpO2 94-97% 4LO2  Pt received in chair, agreeable to mobility session. No complaints. SpO2 >90% on 4LO2. CGA throughout, mild unsteadiness but no overt LOB or physical assist needed. Returned to room without fault. Left in chair with all needs met, call bell in reach. Chair alarm on.   Ileana Lute Mobility Specialist Please contact via SecureChat or Rehab office at [phone]

## 2024-01-08 NOTE — Progress Notes (Signed)
 [facility] Progress Note   Subjective:    Seen and examined patient in his room. Currently sitting in his recliner. He reports slowly feeling better after yesterday's HD. Noted net UF 3.5L. Next HD 7/24.  Objective Vitals:   01/07/24 1941 01/07/24 2300 01/08/24 0816 01/08/24 1207  BP: (!) 134/95 131/79 (!) 140/91 (!) 137/91  Pulse: 79 81 76 73  Resp: 20 18 18 18   Temp: 98.5 F (36.9 C) (!) 97.1 F (36.2 C) 98.4 F (36.9 C) 97.6 F (36.4 C)  TempSrc: Oral  Oral Oral  SpO2: 100% 96% (!) 86% 94%  Weight:      Height:       Physical Exam General: Awake, alert, NAD Heart: S1 and S2; No murmurs, gallops, or rubs Lungs: (+) rales posterior lower-mid lobes Abdomen: Soft and non-tender Extremities: 2+ edema RUE; 2-3+ edema BLLEs-slowly coming down with HD Dialysis Access: [facility]   Filed Weights   01/06/24 1251 01/07/24 1359 01/07/24 1743  Weight: 110 kg 133.6 kg 129.5 kg    Intake/Output Summary (Last 24 hours) at 01/08/2024 1318 Last data filed at 01/08/2024 1038 Gross per 24 hour  Intake 827.7 ml  Output 3500 ml  Net -2672.3 ml    Additional Objective Labs: Basic Metabolic Panel: Recent Labs  Lab 01/06/24 1553 01/07/24 0331 01/08/24 0300  NA 137 135 135  K 4.4 4.6 4.3  CL 95* 96* 97*  CO2 26 25 26   GLUCOSE 81 78 91  BUN 40* 51* 42*  CREATININE 9.52* 11.66* 9.34*  CALCIUM  8.2* 8.0* 8.5*   Liver Function Tests: Recent Labs  Lab 01/06/24 1553  AST 19  ALT 8  ALKPHOS 66  BILITOT 0.7  PROT 7.3  ALBUMIN  3.2*   No results for input(s): LIPASE, AMYLASE in the last 168 hours. CBC: Recent Labs  Lab 01/06/24 1553 01/07/24 0331 01/07/24 2039 01/08/24 0300  WBC 6.9 6.5 5.8 5.3  NEUTROABS 4.5  --   --   --   HGB 10.5* 8.5* 9.2* 8.7*  HCT 32.7* 25.6* 28.7* 26.8*  MCV 104.8* 102.4* 102.9* 103.1*  PLT 80* 72* 81* 80*   Blood Culture    Component Value Date/Time   SDES BLOOD LEFT HAND 01/06/2024 2123   SPECREQUEST  01/06/2024 2123     BOTTLES DRAWN AEROBIC AND ANAEROBIC Blood Culture adequate volume   CULT  01/06/2024 2123    NO GROWTH 2 DAYS Performed at [facility], [address], [place], [place] [address]    REPTSTATUS PENDING 01/06/2024 2123    Cardiac Enzymes: No results for input(s): CKTOTAL, CKMB, CKMBINDEX, TROPONINI in the last 168 hours. CBG: No results for input(s): GLUCAP in the last 168 hours. Iron  Studies: No results for input(s): IRON , TIBC, TRANSFERRIN, FERRITIN in the last 72 hours. Lab Results  Component Value Date   INR 2.5 (H) 04/20/2023   INR 2.6 (H) 04/19/2023   INR 2.7 (H) 04/18/2023   PROTIME 28.8 (H) 11/04/2013   PROTIME 21.6 (H) 10/14/2013   PROTIME 18.0 (H) 09/23/2013   Studies/Results: CT Angio Chest Pulmonary Embolism (PE) W or WO Contrast Result Date: 01/08/2024 EXAM: CTA CHEST 01/08/2024 12:02:50 AM TECHNIQUE: CTA of the chest was performed after the administration of 75mL of intravenous iohexol  (OMNIPAQUE ) 350 MG/ML injection. Multiplanar reformatted images are provided for review. MIP images are provided for review. Automated exposure control, iterative reconstruction, and/or weight based adjustment of the mA/kV was utilized to reduce the radiation dose to as low as  reasonably achievable. COMPARISON: CT chest dated 11/08/2022. CLINICAL HISTORY: Pulmonary embolism (PE) suspected, low to intermediate prob, neg D-dimer. FINDINGS: PULMONARY ARTERIES: Pulmonary arteries are adequately opacified for evaluation. No evidence of pulmonary embolism. MEDIASTINUM: Chronic mediastinal lymphadenopathy, although mildly progressive, measuring up to 2.4 cm in the right paratracheal region (image 41) with preservation of the normal fatty hilum, previously 1.3 cm. No suspicious axillary lymphadenopathy. LUNGS AND PLEURA: Mozella attenuation / ground glass opacity in the lungs bilaterally, favoring scattered atelectasis due to expiratory imaging. No evidence of pleural effusion or  pneumothorax. UPPER ABDOMEN: Spleen is normal in size. SOFT TISSUES AND BONES: Mild degenerative changes of the lower thoracic spine. Very mild thoracic atherosclerosis. Mild coronary atherosclerosis of the LAD. IMPRESSION: 1. No evidence of pulmonary embolism. 2. Chronic mediastinal lymphadenopathy, mildly progressive. Reactive nodes are favored, although low grade lymphoproliferative disorder is technically possible. Consider follow-up CT chest with contrast in 3-6 months. Electronically signed by: Pinkie Pebbles MD 01/08/2024 12:19 AM EDT RP Workstation: [id number]   ECHOCARDIOGRAM COMPLETE Result Date: 01/07/2024    ECHOCARDIOGRAM REPORT   Patient Name:   BASEM YANNUZZI Date of Exam: 01/07/2024 Medical Rec #:  [id number]      Height:       73.0 in Accession #:    [id number]     Weight:       242.5 lb Date of Birth:  08-29-1980     BSA:          2.335 m Patient Age:    43 years       BP:           156/98 mmHg Patient Gender: M              HR:           93 bpm. Exam Location:  Inpatient Procedure: 2D Echo, Color Doppler and Cardiac Doppler (Both Spectral and Color            Flow Doppler were utilized during procedure). Indications:    Dyspnea  History:        Patient has prior history of Echocardiogram examinations. Risk                 Factors:Hypertension.  Sonographer:    CM Referring Phys: [id number] DONALD HERO RUMBALL IMPRESSIONS  1. LVOT Flow accleration noted. There is an aliased intracavitary gradient noted (not LVOT). LVOT gradient 20 mm Hg (resting). Valsalva gradient 18 mm Hg . Left ventricular ejection fraction, by estimation, is 65 to 70%. The left ventricle has normal function. The left ventricle has no regional wall motion abnormalities. There is severe concentric left ventricular hypertrophy. Left ventricular diastolic parameters are indeterminate.  2. Right ventricular systolic function is normal. The right ventricular size is moderately enlarged.  3. Left atrial size was moderately dilated.  4.  The mitral valve is degenerative. No evidence of mitral valve regurgitation. Moderate to severe mitral stenosis. The mean mitral valve gradient is 12.0 mmHg.  5. The aortic valve was not well visualized. There is mild calcification of the aortic valve. Aortic valve regurgitation is not visualized. No aortic stenosis is present.  6. The inferior vena cava is dilated in size with <50% respiratory variability, suggesting right atrial pressure of 15 mmHg. Comparison(s): Prior images reviewed side by side. Mitral gradient has increase dramatically but with notablly elevated heart rates. Conclusion(s)/Recommendation(s): Constellation of significant hypertrophy and calcified valve greater than expected for age can be seen in ESRD. In lieu of this, consider  additional imaging. FINDINGS  Left Ventricle: LVOT Flow accleration noted. There is an aliased intracavitary gradient noted (not LVOT). LVOT gradient 20 mm Hg (resting). Valsalva gradient 18 mm Hg. Left ventricular ejection fraction, by estimation, is 65 to 70%. The left ventricle has normal function. The left ventricle has no regional wall motion abnormalities. The left ventricular internal cavity size was normal in size. There is severe concentric left ventricular hypertrophy. Left ventricular diastolic parameters are indeterminate. Right Ventricle: The right ventricular size is moderately enlarged. No increase in right ventricular wall thickness. Right ventricular systolic function is normal. Left Atrium: Left atrial size was moderately dilated. Right Atrium: Right atrial size was normal in size. Pericardium: There is no evidence of pericardial effusion. Mitral Valve: The mitral valve is degenerative in appearance. No evidence of mitral valve regurgitation. Moderate to severe mitral valve stenosis. MV peak gradient, 21.9 mmHg. The mean mitral valve gradient is 12.0 mmHg with average heart rate of 95 bpm. Tricuspid Valve: The tricuspid valve is normal in structure.  Tricuspid valve regurgitation is not demonstrated. No evidence of tricuspid stenosis. Aortic Valve: The aortic valve was not well visualized. There is mild calcification of the aortic valve. Aortic valve regurgitation is not visualized. No aortic stenosis is present. Aortic valve mean gradient measures 8.0 mmHg. Aortic valve peak gradient  measures 16.8 mmHg. Aortic valve area, by VTI measures 3.15 cm. Pulmonic Valve: The pulmonic valve was normal in structure. Pulmonic valve regurgitation is not visualized. No evidence of pulmonic stenosis. Aorta: The aortic root and ascending aorta are structurally normal, with no evidence of dilitation. Venous: The inferior vena cava is dilated in size with less than 50% respiratory variability, suggesting right atrial pressure of 15 mmHg. IAS/Shunts: The atrial septum is grossly normal.  LEFT VENTRICLE PLAX 2D LVIDd:         4.80 cm   Diastology LVIDs:         3.00 cm   LV e' medial:  5.35 cm/s LV PW:         1.80 cm   LV e' lateral: 6.22 cm/s LV IVS:        2.50 cm LVOT diam:     2.40 cm LV SV:         124 LV SV Index:   53 LVOT Area:     4.52 cm  RIGHT VENTRICLE             IVC RV S prime:     20.00 cm/s  IVC diam: 2.10 cm TAPSE (M-mode): 2.7 cm LEFT ATRIUM              Index        RIGHT ATRIUM           Index LA Vol (A2C):   107.0 ml 45.82 ml/m  RA Area:     15.40 cm LA Vol (A4C):   86.5 ml  37.04 ml/m  RA Volume:   35.80 ml  15.33 ml/m LA Biplane Vol: 102.0 ml 43.68 ml/m  AORTIC VALVE AV Area (Vmax):    3.18 cm AV Area (Vmean):   3.07 cm AV Area (VTI):     3.15 cm AV Vmax:           205.00 cm/s AV Vmean:          131.000 cm/s AV VTI:            0.393 m AV Peak Grad:      16.8 mmHg AV Mean Grad:  8.0 mmHg LVOT Vmax:         144.00 cm/s LVOT Vmean:        88.900 cm/s LVOT VTI:          0.274 m LVOT/AV VTI ratio: 0.70  AORTA Ao Root diam: 3.10 cm Ao Asc diam:  3.40 cm MITRAL VALVE MV Area VTI:  2.53 cm    SHUNTS MV Peak grad: 21.9 mmHg   Systemic VTI:  0.27 m MV  Mean grad: 12.0 mmHg   Systemic Diam: 2.40 cm MV Vmax:      2.34 m/s MV Vmean:     153.5 cm/s Stanly Leavens MD Electronically signed by Stanly Leavens MD Signature Date/Time: 01/07/2024/10:51:31 AM    Final    DG Chest 2 View Result Date: 01/06/2024 CLINICAL DATA:  Questionable sepsis. EXAM: CHEST - 2 VIEW COMPARISON:  08/02/2023. FINDINGS: Right IJ dialysis catheter terminates in the high right atrium. Heart is enlarged, stable. Lungs are low in volume with next interstitial and airspace opacification. No pleural fluid. IMPRESSION: Mild pulmonary edema. Electronically Signed   By: Newell Eke M.D.   On: 01/06/2024 14:39    Medications:  cefTRIAXone  (ROCEPHIN )  IV 2 g (01/08/24 1216)    apixaban   5 mg Oral BID   aspirin   81 mg Oral Daily   atorvastatin   40 mg Oral Daily   calcitRIOL   2.5 mcg Oral Q M,W,F-HD   cinacalcet   150 mg Oral Q M,W,F-HD   divalproex   1,000 mg Oral Q12H   doxycycline   100 mg Oral Q12H   feeding supplement (NEPRO CARB STEADY)  237 mL Oral BID BM   ferric citrate   840 mg Oral TID WC   levETIRAcetam   500 mg Oral BID   levETIRAcetam   500 mg Oral Once per day on Monday Wednesday Friday   sodium chloride  flush  10-40 mL Intracatheter Q12H    Dialysis Orders: MWF [facility] 4h   B400   108kg   2K bath  TDC   Heparin  none Prog vol^^, was 12kg over 3 wks ago and 20kg over this past week Goes to all his sessions and stays on machine  Home bp meds: Losartan 50 mg daily   Assessment/Plan: PNA: w/ fever, SOB, O2 requirement, per Primary Pulm edema: by CXR and sig vol overload/ anasarca on exam. Came off 20kg over at OP HD monday. Received HD 7/22 and 3.5L off. Next HD 7/24. Continue to push UF. Mitral stenosis - seen on ECHO 7/22, Cardiology consulted ESRD: on HD MWF. See above HTN: takes ARB at home, bp's wnl here.  Volume: overloaded as noted above.  Anemia of esrd: Hb 8.5- 11 here, follow.   Charmaine Piety, NP [facility] 01/08/2024,1:18 PM  LOS: 2 days

## 2024-01-08 NOTE — TOC Initial Note (Signed)
 Transition of Care [facility]) - Initial/Assessment Note    Patient Details  Name: [name] [name] MRN: [id number] Date of Birth: 1981-06-17  Transition of Care [facility]) CM/SW Contact:    Vlada Uriostegui A [place], LCSW Phone Number: 01/08/2024, 12:21 PM  Clinical Narrative:                  CSW met with pt at bedside. Pt is from [facility] for short term rehab. Plan to return to facility once medically stable. Will need insurance auth to return.   CSW confirmed with Kia at [facility] that pt is from [facility] and able to return when stable.    TOC will continue to follow.  Expected Discharge Plan: Skilled Nursing Facility Barriers to Discharge: Continued Medical Work up, English as a second language teacher   Patient Goals and CMS Choice            Expected Discharge Plan and Services In-house Referral: Clinical Social Work     Living arrangements for the past 2 months: Skilled Holiday representative, Single Family Home                                      Prior Living Arrangements/Services Living arrangements for the past 2 months: Skilled Nursing Facility, Single Family Home Lives with:: Self          Need for Family Participation in Patient Care: No (Comment) Care giver support system in place?: No (comment)      Activities of Daily Living   ADL Screening (condition at time of admission) Independently performs ADLs?: Yes (appropriate for developmental age) Is the patient deaf or have difficulty hearing?: No Does the patient have difficulty seeing, even when wearing glasses/contacts?: Yes Does the patient have difficulty concentrating, remembering, or making decisions?: Yes  Permission Sought/Granted                  Emotional Assessment Appearance:: Appears stated age Attitude/Demeanor/Rapport: Lethargic Affect (typically observed): Quiet Orientation: : Oriented to Self, Oriented to Place, Oriented to Situation, Oriented to  Time Alcohol / Substance Use: Not Applicable Psych  Involvement: No (comment)  Admission diagnosis:  Hypoxia [R09.02] ESRD on dialysis (HCC) [N18.6, Z99.2] Acute febrile illness [R50.9] Fever, unspecified fever cause [R50.9] Acute cough [R05.1] Acute hypoxic respiratory failure (HCC) [J96.01] Patient Active Problem List   Diagnosis Date Noted   Aplastic anemia (HCC) 01/07/2024   Mitral stenosis 01/07/2024   Acute hypoxic respiratory failure (HCC) 01/06/2024   Pneumonia 01/06/2024   Chronic health problem 01/06/2024   Prolonged Q-T interval on ECG 01/06/2024   Acute encephalopathy 10/21/2023   Acute metabolic encephalopathy 10/21/2023   History of stroke 08/01/2023   Cerebrovascular accident (CVA) due to embolism of cerebral artery (HCC) 04/04/2023   Seizure (HCC) 01/19/2023   Acute respiratory failure with hypoxia (HCC) 01/19/2023   Status epilepticus (HCC) 08/10/2022   ESRD on hemodialysis (HCC) 02/15/2022   Other disorders of phosphorus metabolism 08/18/2021   Volume overload 05/10/2021   Allergy, unspecified, initial encounter 04/19/2021   Anaphylactic shock, unspecified, sequela 04/19/2021   Pruritus, unspecified 04/19/2021   Hypocalcemia 04/01/2021   Long-term current use of anticonvulsant 08/04/2020   COVID-19 07/26/2020   Breakthrough seizure (HCC) 07/20/2020   Acute respiratory failure with hypoxia (HCC)    Lupus    Seizure, late effect of stroke (HCC) 08/26/2019   Secondary hyperparathyroidism of renal origin (HCC) 11/05/2018   Anemia 09/23/2018  ESRD (end stage renal disease) (HCC) 03/18/2018   Prediabetes 01/10/2018   Chest pain in adult    CKD (chronic kidney disease) stage 3, GFR 30-59 ml/min (HCC) 10/24/2015   Seizures (HCC) 10/24/2015   HTN (hypertension) 01/12/2015   Chronic anticoagulation 04/24/2011   Lupus anticoagulant positive 04/24/2011   OBESITY, NOS 08/15/2006   Thrombocytopenia (HCC) 08/15/2006   TOBACCO DEPENDENCE 08/15/2006   CVA 08/15/2006   PCP:  Theotis Haze ORN, NP Pharmacy:    [facility] - [address] [place] - [address] [place] [address] Phone: [phone] Fax: [phone]  [facility] [address] [place] [address] Phone: [phone] Fax: [phone]     Social Drivers of Health (SDOH) Social History: SDOH Screenings   Food Insecurity: No Food Insecurity (01/06/2024)  Housing: Unknown (01/06/2024)  Recent Concern: Housing - High Risk (10/21/2023)  Transportation Needs: No Transportation Needs (01/06/2024)  Utilities: Not At Risk (01/06/2024)  Alcohol Screen: Low Risk  (03/26/2023)  Depression (PHQ2-9): Low Risk  (03/26/2023)  Financial Resource Strain: Low Risk  (03/26/2023)  Physical Activity: Inactive (03/26/2023)  Social Connections: Patient Declined (01/06/2024)  Recent Concern: Social Connections - Moderately Isolated (10/21/2023)  Stress: No Stress Concern Present (03/26/2023)  Tobacco Use: High Risk (01/06/2024)  Health Literacy: Adequate Health Literacy (03/26/2023)   SDOH Interventions:     Readmission Risk Interventions    01/08/2024   12:19 PM 03/28/2023    2:57 PM 01/25/2023    3:40 PM  Readmission Risk Prevention Plan  Post Dischage Appt     Medication Screening     Transportation Screening Complete Complete   Medication Review (RN Care Manager) Complete Referral to Pharmacy   PCP or Specialist appointment within 3-5 days of discharge Complete Complete   HRI or Home Care Consult Complete Complete   SW Recovery Care/Counseling Consult Complete Complete   Palliative Care Screening Not Applicable Not Applicable   Skilled Nursing Facility Complete Not Applicable      Information is confidential and restricted. Go to Review Flowsheets to unlock data.

## 2024-01-08 NOTE — Assessment & Plan Note (Addendum)
 Antiphospholipid syndrome  History of CVA - On Eliquis  5 mg twice daily since 2019 (previously noncompliant on warfarin), not on aspirin . On speaking with patient, reports has been taking aspirin  81 mg daily; will resume Seizures - Home Keppra , home Depakote  (Keppra  500 mg twice daily and 500 mg MWF after HD and Depakote  1000 mg every 12 hours for seizure prophylaxis) Clarified timing with patient today but lingering questions about timing of medications in patient on dialysis, will double-back to re-confirm Hypertension - Holding home losartan 50 mg daily; consider restarting post procedure vs at discharge Thrombocytopenia - Chronic, suspect 2/2 antiepileptics and/or reactive infectious etiology

## 2024-01-08 NOTE — Plan of Care (Signed)

## 2024-01-09 DIAGNOSIS — E8809 Other disorders of plasma-protein metabolism, not elsewhere classified: Secondary | ICD-10-CM | POA: Insufficient documentation

## 2024-01-09 DIAGNOSIS — N186 End stage renal disease: Secondary | ICD-10-CM | POA: Diagnosis not present

## 2024-01-09 DIAGNOSIS — J189 Pneumonia, unspecified organism: Secondary | ICD-10-CM | POA: Diagnosis not present

## 2024-01-09 DIAGNOSIS — J9601 Acute respiratory failure with hypoxia: Secondary | ICD-10-CM | POA: Diagnosis not present

## 2024-01-09 LAB — RENAL FUNCTION PANEL
Albumin: 2.7 g/dL — ABNORMAL LOW (ref 3.5–5.0)
Anion gap: 13 (ref 5–15)
BUN: 62 mg/dL — ABNORMAL HIGH (ref 6–20)
CO2: 25 mmol/L (ref 22–32)
Calcium: 8.4 mg/dL — ABNORMAL LOW (ref 8.9–10.3)
Chloride: 97 mmol/L — ABNORMAL LOW (ref 98–111)
Creatinine, Ser: 12.14 mg/dL — ABNORMAL HIGH (ref 0.61–1.24)
GFR, Estimated: 5 mL/min — ABNORMAL LOW (ref >60)
Glucose, Bld: 116 mg/dL — ABNORMAL HIGH (ref 70–99)
Phosphorus: 8.1 mg/dL — ABNORMAL HIGH (ref 2.5–4.6)
Potassium: 4.8 mmol/L (ref 3.5–5.1)
Sodium: 135 mmol/L (ref 135–145)

## 2024-01-09 LAB — CBC
HCT: 27.7 % — ABNORMAL LOW (ref 39.0–52.0)
Hemoglobin: 8.9 g/dL — ABNORMAL LOW (ref 13.0–17.0)
MCH: 33.1 pg (ref 26.0–34.0)
MCHC: 32.1 g/dL (ref 30.0–36.0)
MCV: 103 fL — ABNORMAL HIGH (ref 80.0–100.0)
Platelets: 98 K/uL — ABNORMAL LOW (ref 150–400)
RBC: 2.69 MIL/uL — ABNORMAL LOW (ref 4.22–5.81)
RDW: 15.9 % — ABNORMAL HIGH (ref 11.5–15.5)
WBC: 5 K/uL (ref 4.0–10.5)
nRBC: 0 % (ref 0.0–0.2)

## 2024-01-09 LAB — MAGNESIUM: Magnesium: 2.4 mg/dL (ref 1.7–2.4)

## 2024-01-09 MED ORDER — DIPHENHYDRAMINE HCL 50 MG/ML IJ SOLN
25.0000 mg | Freq: Once | INTRAMUSCULAR | Status: AC
  Administered 2024-01-13: 25 mg via INTRAVENOUS

## 2024-01-09 MED ORDER — DIPHENHYDRAMINE HCL 50 MG/ML IJ SOLN
INTRAMUSCULAR | Status: AC
  Filled 2024-01-09: qty 1

## 2024-01-09 MED ORDER — ALTEPLASE 2 MG IJ SOLR
2.0000 mg | Freq: Once | INTRAMUSCULAR | Status: DC | PRN
  Filled 2024-01-09: qty 2

## 2024-01-09 MED ORDER — LIDOCAINE-PRILOCAINE 2.5-2.5 % EX CREA
1.0000 | TOPICAL_CREAM | CUTANEOUS | Status: DC | PRN
  Filled 2024-01-09: qty 5

## 2024-01-09 MED ORDER — LIDOCAINE HCL (PF) 1 % IJ SOLN
5.0000 mL | INTRAMUSCULAR | Status: DC | PRN
  Filled 2024-01-09: qty 5

## 2024-01-09 MED ORDER — HEPARIN SODIUM (PORCINE) 1000 UNIT/ML DIALYSIS
1000.0000 [IU] | INTRAMUSCULAR | Status: DC | PRN
  Filled 2024-01-09: qty 1

## 2024-01-09 MED ORDER — PENTAFLUOROPROP-TETRAFLUOROETH EX AERO: 1.0000 | INHALATION_SPRAY | CUTANEOUS | Status: DC | PRN

## 2024-01-09 MED ORDER — DIPHENHYDRAMINE HCL 25 MG PO CAPS
ORAL_CAPSULE | ORAL | Status: AC
  Filled 2024-01-09: qty 1

## 2024-01-09 NOTE — Progress Notes (Signed)
 Pt came back to rm 34 from HD. Obtained vs. Call bell within reach.   Amado GORMAN Arabia, RN

## 2024-01-09 NOTE — Plan of Care (Signed)

## 2024-01-09 NOTE — Progress Notes (Signed)
 Pt receives out-pt HD at [facility] on MWF 7:05 am chair time. Will assist as needed.   Randine Mungo Dialysis Navigator [phone]

## 2024-01-09 NOTE — Assessment & Plan Note (Addendum)
 On room air this morning but then desatted on way to dialysis and was placed on O2 via [place] at 4L/min. - Humidifying O2 given nose bleeds - Abx: IV Ceftriaxone , PO doxycycline  (7/22-) - Nephro following for volume management with dialysis - Pain: Tylenol  650 mg q6h PRN, oxycodone -acetaminophen  5-325 q6h PRN - If patient using more frequently, consider adjusting frequency vs dose - Labs: AM CBC, BMP - Blood cultures: No growth 2 days; continue to follow - C3, C4, dsDNA (lupus labs) pending; if indicative of lupus flare, start prednisone  60 mg daily for 5 days - Wean O2 as tolerated - Continuous pulse ox - PT/OT eval and treat - Consider outpatient pulmonology need

## 2024-01-09 NOTE — Assessment & Plan Note (Signed)
 Hgb of 8.9, stable from 8.7 yesterday. - Transfusion threshold <7 - Continue to monitor

## 2024-01-09 NOTE — Progress Notes (Signed)
 Received patient in bed to unit.  Alert and oriented.  Informed consent signed and in chart.   TX duration:3.5  Patient tolerated well.  Transported back to the room  Alert, without acute distress.  Hand-off given to patient's nurse.   Access used: Catheter Access issues: NA  Total UF removed: 5L Medication(s) given: NA Post HD weight: 126.4kg   01/09/24 1254  Vitals  Temp 97.9 F (36.6 C)  BP 113/77  Pulse Rate 68  Resp 12  Weight 126.4 kg  Oxygen Therapy  SpO2 94 %  O2 Device Room Air  Pulse Oximetry Type Continuous  During Treatment Monitoring  Blood Flow Rate (mL/min) 349 mL/min  Arterial Pressure (mmHg) -268.27 mmHg  Venous Pressure (mmHg) 154.54 mmHg  TMP (mmHg) 34.74 mmHg  Ultrafiltration Rate (mL/min) 1647 mL/min  Dialysate Flow Rate (mL/min) 300 ml/min  Dialysate Potassium Concentration 2  Dialysate Calcium  Concentration 2.5  Duration of HD Treatment -hour(s) 3.5 hour(s)  Cumulative Fluid Removed (mL) per Treatment  5000.44  HD Safety Checks Performed Yes  Intra-Hemodialysis Comments Tx completed;Tolerated well  Dialysis Fluid Bolus Normal Saline  Bolus Amount (mL) 300 mL  Post Treatment  Dialyzer Clearance Clear  Liters Processed 83.1  Fluid Removed (mL) 5000 mL  Tolerated HD Treatment Yes  Hemodialysis Catheter Right  Placement Date/Time: (c) 01/09/24 (c) 9072   Placed prior to admission: Yes  Orientation: Right  Site Condition No complications  Blue Lumen Status Dead end cap in place  Red Lumen Status Dead end cap in place  Catheter fill solution Heparin  1000 units/ml  Catheter fill volume (Arterial) 1.6 cc  Catheter fill volume (Venous) 1.6  Dressing Type Transparent  Dressing Status Antimicrobial disc/dressing in place  Dressing Change Due 01/14/24  Post treatment catheter status Capped and Clamped       Ollen LITTIE [facility]

## 2024-01-09 NOTE — Assessment & Plan Note (Signed)
 Outpatient dialysis schedule MWF; now on TTS schedule inpatient; dialysis today 7/24. - Continue home calcitriol , Auryxia , Sensipar  - Appreciate nephrology recommendations

## 2024-01-09 NOTE — Progress Notes (Signed)
 Daily Progress Note Intern Pager: [phone]  Patient name: [name] [name] Medical record number: [id number] Date of birth: May 11, 1981 Age: 43 y.o. Gender: male  Primary Care Provider: Theotis [name] ORN, NP Consultants: Nephrology, cardiology  Code Status: FULL  Pt Overview and Major Events to Date:  - 7/21: Admitted, nephrology consulted for dialysis - 7/23: Cardiology consulted for echo findings  Medical Decision Making:  [name] [name] is a 43 y.o. male admitted for fever and dyspnea possibly 2/2 CAP vs volume overload. Patient has been receiving abx and dialysis with improvement. CRP of 98, very elevated. Considering inflammatory component to dyspnea, such as from lupus flair; lupus labs pending.  Pertinent PMH/PSH includes ESRD on dialysis, HTN, hx CVA, hx seizure, lupus, antiphospholipid syndrome.  Assessment & Plan Acute hypoxic respiratory failure (HCC) Pneumonia Volume overload On room air this morning but then desatted on way to dialysis and was placed on O2 via [place] at 4L/min. - Humidifying O2 given nose bleeds - Abx: IV Ceftriaxone , PO doxycycline  (7/22-) - Nephro following for volume management with dialysis - Pain: Tylenol  650 mg q6h PRN, oxycodone -acetaminophen  5-325 q6h PRN - If patient using more frequently, consider adjusting frequency vs dose - Labs: AM CBC, BMP - Blood cultures: No growth 2 days; continue to follow - C3, C4, dsDNA (lupus labs) pending; if indicative of lupus flare, start prednisone  60 mg daily for 5 days - Wean O2 as tolerated - Continuous pulse ox - PT/OT eval and treat - Consider outpatient pulmonology need Mitral stenosis Mod-severe mitral stenosis on echo 7/22. - Cardiology consulted, appreciate recommendations: - Toprol  25 mg daily for HR - Repeat echo tomorrow 7/25 (when HR, fluid status down) Anemia Hgb of 8.9, stable from 8.7 yesterday. - Transfusion threshold <7 - Continue to monitor ESRD (end stage renal disease)  (HCC) Outpatient dialysis schedule MWF; now on TTS schedule inpatient; dialysis today 7/24. - Continue home calcitriol , Auryxia , Sensipar  - Appreciate nephrology recommendations Prolonged Q-T interval on ECG EKG 7/23 with QTc of 475, improved. - Avoid QT prolonging medications Hypoalbuminemia Albumin  of 2.7 today. - Feeding supplement twice daily between meals (though patient has been refusing) Chronic health problem Antiphospholipid syndrome  History of CVA - Eliquis  5 mg twice daily, aspirin  81 mg daily (previously noncompliant on warfarin) Seizures - Home Keppra , home Depakote  (Keppra  500 mg twice daily and 500 mg MWF after HD and Depakote  1000 mg every 12 hours for seizure prophylaxis); per patient, has been taking Keppra  twice daily every day (dialysis days included), but will keep patient on previously prescribed regimen Hypertension - Holding home losartan 50 mg daily; consider restarting Thrombocytopenia - Chronic, suspect 2/2 antiepileptics and/or reactive infectious etiology  FEN/GI: Renal diet PPx: Eliquis  5 mg twice daily Dispo:SNF per PT recs pending clinical improvement . Barriers include continued O2 [name], potential lupus flare workup.   Subjective:  Patient reports new onset cough today, dry. Otherwise is doing well, no difficulty with breathing, no chest pain or palpitations, no other concerns.  Objective: Temp:  [97.4 F (36.3 C)-98.4 F (36.9 C)] 97.9 F (36.6 C) (07/24 0445) Pulse Rate:  [62-79] 62 (07/24 0445) Resp:  [17-19] 17 (07/24 0445) BP: (122-140)/(77-91) 122/79 (07/24 0445) SpO2:  [86 %-100 %] 91 % (07/24 0445) Physical Exam: General: Patient seen in dialysis; lying down in bed, no acute distress. Cardiovascular: Regular rate and rhythm, no murmurs/rubs/gallops. Respiratory: Normal work of breathing on 4L O2 via [place]. Occasional cough. Clear to auscultation bilaterally in anterior fields; no wheezes,  crackles; however, exam limited by pt positioning  given ongoing dialysis. Abdomen: Bowel sounds present and normoactive bilaterally. Soft, nondistended, nontender. Extremities: Skin warm, dry. Bilateral lower extremity edema present, unchanged from prior.  Laboratory: Most recent CBC Lab Results  Component Value Date   WBC 5.0 01/09/2024   HGB 8.9 (L) 01/09/2024   HCT 27.7 (L) 01/09/2024   MCV 103.0 (H) 01/09/2024   PLT 98 (L) 01/09/2024   Most recent BMP    Latest Ref Rng & Units 01/09/2024    2:55 AM  BMP  Glucose 70 - 99 mg/dL 883   BUN 6 - 20 mg/dL 62   Creatinine 9.38 - 1.24 mg/dL 87.85   Sodium 864 - 854 mmol/L 135   Potassium 3.5 - 5.1 mmol/L 4.8   Chloride 98 - 111 mmol/L 97   CO2 22 - 32 mmol/L 25   Calcium  8.9 - 10.3 mg/dL 8.4   Mag 2.4  C3, C4, Anti-DNA antibody: Pending   [name] Palma, MD 01/09/2024, 7:14 AM  PGY-1, [facility] FPTS Intern pager: [phone], text pages welcome Secure chat group [facility] Teaching Service

## 2024-01-09 NOTE — Progress Notes (Signed)
 Brief cardiology note: HR improved into the 60s. In HD currently. We will order a limited echo to be done tomorrow after he has been on metoprolol  for at least two doses to assess mitral valve gradient at lower heart rate.  Shelda Bruckner, MD, PhD, [facility] [place]  [facility]    Heart & Vascular at [facility] at [facility] [address], [address] [place], [place] [address] [phone]

## 2024-01-09 NOTE — Assessment & Plan Note (Signed)
 Albumin  of 2.7 today. - Feeding supplement twice daily between meals (though patient has been refusing)

## 2024-01-09 NOTE — Assessment & Plan Note (Addendum)
 EKG 7/23 with QTc of 475, improved. - Avoid QT prolonging medications

## 2024-01-09 NOTE — Progress Notes (Signed)
 PT Cancellation Note  Patient Details Name: [name] [name] MRN: [id number] DOB: 09-07-1980   Cancelled Treatment:    Reason Eval/Treat Not Completed: (P) Patient at procedure or test/unavailable (pt at HD dept) Attempted 0930 and again at 1321, pt not back on unit yet. Will continue efforts per PT plan of care as schedule permits.   Connell HERO Alexzandra Bilton 01/09/2024, 4:23 PM* delayed entry

## 2024-01-09 NOTE — Progress Notes (Signed)
 Hold metoprolol  po due to low HR=56.   [name] [name] Arabia, RN

## 2024-01-09 NOTE — Assessment & Plan Note (Addendum)
 Antiphospholipid syndrome  History of CVA - Eliquis  5 mg twice daily, aspirin  81 mg daily (previously noncompliant on warfarin) Seizures - Home Keppra , home Depakote  (Keppra  500 mg twice daily and 500 mg MWF after HD and Depakote  1000 mg every 12 hours for seizure prophylaxis); per patient, has been taking Keppra  twice daily every day (dialysis days included), but will keep patient on previously prescribed regimen Hypertension - Holding home losartan 50 mg daily; consider restarting Thrombocytopenia - Chronic, suspect 2/2 antiepileptics and/or reactive infectious etiology

## 2024-01-09 NOTE — Progress Notes (Signed)
 [facility] Progress Note   Subjective:    Seen and examined patient on the HD unit. Patient is about to start treatment. Profoundly overloaded. UFG set 4-5L.   Objective Vitals:   01/09/24 0950 01/09/24 1005 01/09/24 1020 01/09/24 1050  BP: (!) 144/90 (!) 135/94 (!) 148/96 101/67  Pulse: 61 62 (!) 59 66  Resp: 13 11 13 12   Temp:      TempSrc:      SpO2: 97% 100% 100% 97%  Weight:      Height:       Physical Exam General: Awake, alert, NAD Heart: S1 and S2; No murmurs, gallops, or rubs Lungs: (+) rales posterior lower-mid lobes Abdomen: Soft and non-tender Extremities: 2+ edema RUE; 2-3+ edema BLLEs-slowly coming down with HD Dialysis Access: [facility]   Filed Weights   01/07/24 1359 01/07/24 1743 01/09/24 0859  Weight: 133.6 kg 129.5 kg 130.5 kg    Intake/Output Summary (Last 24 hours) at 01/09/2024 1157 Last data filed at 01/09/2024 0830 Gross per 24 hour  Intake 236 ml  Output --  Net 236 ml    Additional Objective Labs: Basic Metabolic Panel: Recent Labs  Lab 01/07/24 0331 01/08/24 0300 01/09/24 0255  NA 135 135 135  K 4.6 4.3 4.8  CL 96* 97* 97*  CO2 25 26 25   GLUCOSE 78 91 116*  BUN 51* 42* 62*  CREATININE 11.66* 9.34* 12.14*  CALCIUM  8.0* 8.5* 8.4*  PHOS  --   --  8.1*   Liver Function Tests: Recent Labs  Lab 01/06/24 1553 01/09/24 0255  AST 19  --   ALT 8  --   ALKPHOS 66  --   BILITOT 0.7  --   PROT 7.3  --   ALBUMIN  3.2* 2.7*   No results for input(s): LIPASE, AMYLASE in the last 168 hours. CBC: Recent Labs  Lab 01/06/24 1553 01/07/24 0331 01/07/24 2039 01/08/24 0300 01/09/24 0255  WBC 6.9 6.5 5.8 5.3 5.0  NEUTROABS 4.5  --   --   --   --   HGB 10.5* 8.5* 9.2* 8.7* 8.9*  HCT 32.7* 25.6* 28.7* 26.8* 27.7*  MCV 104.8* 102.4* 102.9* 103.1* 103.0*  PLT 80* 72* 81* 80* 98*   Blood Culture    Component Value Date/Time   SDES BLOOD LEFT HAND 01/06/2024 2123   SPECREQUEST  01/06/2024 2123    BOTTLES DRAWN  AEROBIC AND ANAEROBIC Blood Culture adequate volume   CULT  01/06/2024 2123    NO GROWTH 2 DAYS Performed at [facility], [address], [place], [place] [address]    REPTSTATUS PENDING 01/06/2024 2123    Cardiac Enzymes: No results for input(s): CKTOTAL, CKMB, CKMBINDEX, TROPONINI in the last 168 hours. CBG: No results for input(s): GLUCAP in the last 168 hours. Iron  Studies: No results for input(s): IRON , TIBC, TRANSFERRIN, FERRITIN in the last 72 hours. Lab Results  Component Value Date   INR 2.5 (H) 04/20/2023   INR 2.6 (H) 04/19/2023   INR 2.7 (H) 04/18/2023   PROTIME 28.8 (H) 11/04/2013   PROTIME 21.6 (H) 10/14/2013   PROTIME 18.0 (H) 09/23/2013   Studies/Results: CT Angio Chest Pulmonary Embolism (PE) W or WO Contrast Result Date: 01/08/2024 EXAM: CTA CHEST 01/08/2024 12:02:50 AM TECHNIQUE: CTA of the chest was performed after the administration of 75mL of intravenous iohexol  (OMNIPAQUE ) 350 MG/ML injection. Multiplanar reformatted images are provided for review. MIP images are provided for review. Automated exposure control, iterative reconstruction, and/or weight  based adjustment of the mA/kV was utilized to reduce the radiation dose to as low as reasonably achievable. COMPARISON: CT chest dated 11/08/2022. CLINICAL HISTORY: Pulmonary embolism (PE) suspected, low to intermediate prob, neg D-dimer. FINDINGS: PULMONARY ARTERIES: Pulmonary arteries are adequately opacified for evaluation. No evidence of pulmonary embolism. MEDIASTINUM: Chronic mediastinal lymphadenopathy, although mildly progressive, measuring up to 2.4 cm in the right paratracheal region (image 41) with preservation of the normal fatty hilum, previously 1.3 cm. No suspicious axillary lymphadenopathy. LUNGS AND PLEURA: Mozella attenuation / ground glass opacity in the lungs bilaterally, favoring scattered atelectasis due to expiratory imaging. No evidence of pleural effusion or pneumothorax.  UPPER ABDOMEN: Spleen is normal in size. SOFT TISSUES AND BONES: Mild degenerative changes of the lower thoracic spine. Very mild thoracic atherosclerosis. Mild coronary atherosclerosis of the LAD. IMPRESSION: 1. No evidence of pulmonary embolism. 2. Chronic mediastinal lymphadenopathy, mildly progressive. Reactive nodes are favored, although low grade lymphoproliferative disorder is technically possible. Consider follow-up CT chest with contrast in 3-6 months. Electronically signed by: Sriyesh Krishnan MD 01/08/2024 12:19 AM EDT RP Workstation: [id number]    Medications:  cefTRIAXone  (ROCEPHIN )  IV 2 g (01/08/24 1216)    apixaban   5 mg Oral BID   aspirin   81 mg Oral Daily   atorvastatin   40 mg Oral Daily   calcitRIOL   2.5 mcg Oral Q M,W,F-HD   cinacalcet   150 mg Oral Q M,W,F-HD   diphenhydrAMINE   25 mg Intravenous Once in dialysis   divalproex   1,000 mg Oral Q12H   doxycycline   100 mg Oral Q12H   feeding supplement (NEPRO CARB STEADY)  237 mL Oral BID BM   ferric citrate   840 mg Oral TID WC   levETIRAcetam   500 mg Oral BID   levETIRAcetam   500 mg Oral Once per day on Tuesday Thursday Saturday   metoprolol  succinate  25 mg Oral Daily   sodium chloride  flush  10-40 mL Intracatheter Q12H    Dialysis Orders: MWF [facility] 4h   B400   108kg   2K bath  TDC   Heparin  none Prog vol^^, was 12kg over 3 wks ago and 20kg over this past week Goes to all his sessions and stays on machine   Home bp meds: Losartan 50 mg daily  Assessment/Plan: PNA: w/ fever, SOB, O2 requirement, per Primary Pulm edema: by CXR and sig vol overload/ anasarca on exam. Came off 20kg over at OP HD monday. Received extra HD 7/22 and 3.5L off. On HD off schedule (UFG 4-5L). Plan for TTS schedule this week and switch back to MWF next week. Assess for extra HD needs. Continue to push UF. Mitral stenosis - seen on ECHO 7/22, Cardiology consulted. Limited ECHO ordered for today. ESRD: on HD MWF. Currently off schedule. See  above. HTN: takes ARB at home, bp's wnl here.  Volume: overloaded as noted above.  Anemia of esrd: Hb 8.5- 11 here, follow.  2nd HPTH: Corr Ca okay but phos is high. Continue Auryxia , Calcitriol , and Sensipar   Charmaine Piety, NP [facility] 01/09/2024,11:57 AM  LOS: 3 days

## 2024-01-09 NOTE — Assessment & Plan Note (Addendum)
 Mod-severe mitral stenosis on echo 7/22. - Cardiology consulted, appreciate recommendations: - Toprol  25 mg daily for HR - Repeat echo tomorrow 7/25 (when HR, fluid status down)

## 2024-01-09 NOTE — Care Management Important Message (Signed)
 Important Message  Patient Details  Name: [name] [name] MRN: [id number] Date of Birth: 1981-02-27   Important Message Given:  Yes - Medicare IM     Claretta Deed 01/09/2024, 4:29 PM

## 2024-01-09 NOTE — Discharge Instructions (Addendum)
 Dear [name] [name],   Thank you for letting us  participate in your care! In this section, you will find a brief hospital admission summary of why you were admitted to the hospital, what happened during your admission, your diagnosis/diagnoses, and recommended follow up.   You were admitted to the hospital due to pneumonia that was affecting your ability to breath safely.   POST-HOSPITAL & CARE INSTRUCTIONS We recommend following up with your PCP within 1 week from being discharged from the hospital. Please let PCP/Specialists know of any changes in medications that were made which you will be able to see in the medications section of this packet. Please also follow up   DOCTOR'S APPOINTMENTS & FOLLOW UP Future Appointments  Date Time Provider Department Center  02/12/2024  1:00 PM [facility] 2 [facility] H&V  02/12/2024  2:00 PM [facility] PA [facility] H&V  03/31/2024 11:10 AM [facility] ANNUAL WELLNESS VISIT [facility] None     Thank you for choosing [facility]! Take care and be well!  Family Medicine Teaching Service Inpatient Team [place]  [name]  [address] [place], [place] [address] [phone]

## 2024-01-10 ENCOUNTER — Inpatient Hospital Stay (HOSPITAL_COMMUNITY)

## 2024-01-10 DIAGNOSIS — I342 Nonrheumatic mitral (valve) stenosis: Secondary | ICD-10-CM

## 2024-01-10 DIAGNOSIS — J189 Pneumonia, unspecified organism: Secondary | ICD-10-CM | POA: Diagnosis not present

## 2024-01-10 LAB — BASIC METABOLIC PANEL WITH GFR
Anion gap: 17 — ABNORMAL HIGH (ref 5–15)
BUN: 54 mg/dL — ABNORMAL HIGH (ref 6–20)
CO2: 24 mmol/L (ref 22–32)
Calcium: 8.9 mg/dL (ref 8.9–10.3)
Chloride: 92 mmol/L — ABNORMAL LOW (ref 98–111)
Creatinine, Ser: 10.64 mg/dL — ABNORMAL HIGH (ref 0.61–1.24)
GFR, Estimated: 6 mL/min — ABNORMAL LOW (ref >60)
Glucose, Bld: 77 mg/dL (ref 70–99)
Potassium: 4.4 mmol/L (ref 3.5–5.1)
Sodium: 133 mmol/L — ABNORMAL LOW (ref 135–145)

## 2024-01-10 LAB — CBC
HCT: 30.8 % — ABNORMAL LOW (ref 39.0–52.0)
Hemoglobin: 10 g/dL — ABNORMAL LOW (ref 13.0–17.0)
MCH: 33.1 pg (ref 26.0–34.0)
MCHC: 32.5 g/dL (ref 30.0–36.0)
MCV: 102 fL — ABNORMAL HIGH (ref 80.0–100.0)
Platelets: 107 K/uL — ABNORMAL LOW (ref 150–400)
RBC: 3.02 MIL/uL — ABNORMAL LOW (ref 4.22–5.81)
RDW: 15.8 % — ABNORMAL HIGH (ref 11.5–15.5)
WBC: 5.6 K/uL (ref 4.0–10.5)
nRBC: 0 % (ref 0.0–0.2)

## 2024-01-10 LAB — ECHOCARDIOGRAM LIMITED
Area-P 1/2: 1.95 cm2
Height: 73 in
Weight: 4458.58 [oz_av]

## 2024-01-10 LAB — ANTI-DNA ANTIBODY, DOUBLE-STRANDED: ds DNA Ab: 27 [IU]/mL — ABNORMAL HIGH (ref 0–9)

## 2024-01-10 LAB — C3 COMPLEMENT: C3 Complement: 138 mg/dL (ref 82–167)

## 2024-01-10 LAB — MAGNESIUM: Magnesium: 2.3 mg/dL (ref 1.7–2.4)

## 2024-01-10 LAB — C4 COMPLEMENT: Complement C4, Body Fluid: 19 mg/dL (ref 12–38)

## 2024-01-10 MED ORDER — SODIUM CHLORIDE 0.9% FLUSH: 10.0000 mL | INTRAVENOUS | Status: DC | PRN

## 2024-01-10 MED ORDER — LOSARTAN POTASSIUM 50 MG PO TABS
50.0000 mg | ORAL_TABLET | Freq: Every day | ORAL | Status: DC
  Administered 2024-01-10 – 2024-01-13 (×4): 50 mg via ORAL
  Filled 2024-01-10 (×4): qty 1

## 2024-01-10 MED ORDER — LEVETIRACETAM 500 MG PO TABS
500.0000 mg | ORAL_TABLET | ORAL | Status: DC
  Administered 2024-01-13: 500 mg via ORAL
  Filled 2024-01-10: qty 1

## 2024-01-10 MED ORDER — SODIUM CHLORIDE 0.9% FLUSH
10.0000 mL | Freq: Two times a day (BID) | INTRAVENOUS | Status: DC
  Administered 2024-01-10 – 2024-01-11 (×3): 10 mL
  Administered 2024-01-12: 30 mL

## 2024-01-10 MED ORDER — CHLORHEXIDINE GLUCONATE CLOTH 2 % EX PADS: 6.0000 | MEDICATED_PAD | Freq: Every day | CUTANEOUS | Status: DC

## 2024-01-10 NOTE — Assessment & Plan Note (Addendum)
 Antiphospholipid syndrome  History of CVA - Eliquis  5 mg twice daily, aspirin  81 mg daily (previously noncompliant on warfarin) Seizures - Home Keppra , home Depakote  (Keppra  500 mg twice daily and 500 mg MWF after HD and Depakote  1000 mg every 12 hours for seizure prophylaxis); per patient, has been taking Keppra  twice daily every day (dialysis days included), but will keep patient on previously prescribed regimen Hypertension - Holding home losartan 50 mg daily; restart today Thrombocytopenia - Chronic, suspect 2/2 antiepileptics and/or reactive infectious etiology

## 2024-01-10 NOTE — Plan of Care (Signed)

## 2024-01-10 NOTE — Assessment & Plan Note (Addendum)
 Improved; was on room air this morning. - Humidifying O2 given nose bleeds - Abx: IV Ceftriaxone , PO doxycycline  (7/22-), plan for 5 day course (last day 7/26) - Nephro following for volume management with dialysis - Pain: Tylenol  650 mg q6h PRN, oxycodone -acetaminophen  5-325 q6h PRN; discontinuing oxycodone  as has not taking this since 7/23 - Labs: AM RFP - Blood cultures: No growth 3 days; continue to follow - C3, C4, dsDNA pending; if indicative of lupus flare, start prednisone  60 mg daily for 5 days - Wean O2 as tolerated - Continuous pulse ox - PT/OT eval and treat

## 2024-01-10 NOTE — Progress Notes (Signed)
  Echocardiogram 2D Echocardiogram has been performed.  Kery Batzel 01/10/2024, 10:45 AM

## 2024-01-10 NOTE — Progress Notes (Signed)
 Physical Therapy Treatment Patient Details Name: [name] [name] MRN: [id number] DOB: [birth year] Today's Date: 01/10/2024   History of Present Illness [name] [name] is a 43 y.o. male admitted 01/06/24 with fever, dyspnea, desatting to the 70s per chart review in dialysis, cough, swelling BLE. Dx with acute hypoxic respiratory failure.  PMH includes ESRD on hemodialysis, AV fistula placement (Right, 12/24/2023), HTN, Lupus, Seizures (May 2025), Stroke (May 2025), and Wears glasses.    PT Comments  The pt continues to demonstrate deficits in balance, strength, and endurance that place him at risk for falls. He needs repeated cues to clear his feet, take longer steps, stand upright, and extend his knees during stance phase when ambulating. He reports R UE weakness since his CVA and a desire to improve his strength. Thus, after ambulating, focused session on improving his bil UE and lower extremity strength. Will continue to follow acutely.    If plan is discharge home, recommend the following: A little help with walking and/or transfers;A little help with bathing/dressing/bathroom;Assist for transportation;Assistance with cooking/housework   Can travel by private vehicle     Yes  Equipment Recommendations  Other (comment) (defer to next venue of care)    Recommendations for Other Services       Precautions / Restrictions Precautions Precautions: Fall Recall of Precautions/Restrictions: Intact Precaution/Restrictions Comments: monitor SpO2 & HR Restrictions Weight Bearing Restrictions Per Provider Order: No     Mobility  Bed Mobility               General bed mobility comments: Pt sitting EOB upon arrival, in recliner end of session    Transfers Overall transfer level: Needs assistance Equipment used: None Transfers: Sit to/from Stand Sit to Stand: Contact guard assist           General transfer comment: CGA for safety standing from EOB 1x and from recliner 10x using UEs  to push up to stand    Ambulation/Gait Ambulation/Gait assistance: Contact guard assist, Min assist Gait Distance (Feet): 90 Feet Assistive device: None Gait Pattern/deviations: Step-through pattern, Decreased stride length, Wide base of support, Decreased step length - right, Decreased step length - left, Trunk flexed, Knee flexed in stance - right, Knee flexed in stance - left Gait velocity: decreased Gait velocity interpretation: <1.8 ft/sec, indicate of risk for recurrent falls   General Gait Details: Pt ambulates with slow, small steps and a flexed posture with knee flexion in stance. VCs provided to stand upright, clear feet, take longer steps (especially with L), and extend knees during stance phase. Momentary success noted. Pt instability noted, needing up to minA to maintain balance while ambulating   Stairs             Wheelchair Mobility     Tilt Bed    Modified Rankin (Stroke Patients Only)       Balance Overall balance assessment: Needs assistance Sitting-balance support: Feet supported, No upper extremity supported Sitting balance-Leahy Scale: Good     Standing balance support: No upper extremity supported, During functional activity Standing balance-Leahy Scale: Fair Standing balance comment: Needs up to minA for balance when ambulating without UE support, able to stand statically without UE support with CGA-supervision                            Communication Communication Communication: Impaired Factors Affecting Communication: Reduced clarity of speech  Cognition Arousal: Alert, Lethargic Behavior During Therapy: [facility] for tasks assessed/performed  PT - Cognitive impairments: Awareness, Problem solving, Safety/Judgement                       PT - Cognition Comments: pt with history of recent stroke (May 2025) with decreased awareness of deficits and visual impairments; mildly lethargic today, but easy to arouse and remain  awake Following commands: Impaired Following commands impaired: Follows multi-step commands with increased time    Cueing Cueing Techniques: Verbal cues  Exercises General Exercises - Upper Extremity Shoulder Flexion: AROM, Both, 10 reps, Seated Elbow Flexion: AROM, Strengthening, Both, 10 reps, Seated (manual resistance by PT) Elbow Extension: AROM, Strengthening, Both, 10 reps, Seated (manual resistance by PT) Digit Composite Flexion: AROM, Strengthening, Right, 10 reps, Seated, Other (comment) (squeezing towel roll) Chair Push Up: AROM, Strengthening, Both, 5 reps, Seated Other Exercises Other Exercises: sit <> stand x10 serial reps using UEs, CGA for safety    General Comments General comments (skin integrity, edema, etc.): Pleth unreliable and HR questionable being read by pulse ox, stating as low as 78% on RA and up to 178 bpm when ambulating, when pleth was reliable it was >/= 92% on RA and HR 60s bpm when resting      Pertinent Vitals/Pain Pain Assessment Pain Assessment: Faces Faces Pain Scale: No hurt Pain Intervention(s): Monitored during session    Home Living                          Prior Function            PT Goals (current goals can now be found in the care plan section) Acute Rehab PT Goals Patient Stated Goal: to return to rehab PT Goal Formulation: With patient Time For Goal Achievement: 01/21/24 Potential to Achieve Goals: Good Progress towards PT goals: Progressing toward goals    Frequency    Min 2X/week      PT Plan      Co-evaluation              AM-PAC PT 6 Clicks Mobility   Outcome Measure  Help needed turning from your back to your side while in a flat bed without using bedrails?: None Help needed moving from lying on your back to sitting on the side of a flat bed without using bedrails?: None Help needed moving to and from a bed to a chair (including a wheelchair)?: A Little Help needed standing up from a chair  using your arms (e.g., wheelchair or bedside chair)?: A Little Help needed to walk in hospital room?: A Little Help needed climbing 3-5 steps with a railing? : A Lot 6 Click Score: 19    End of Session Equipment Utilized During Treatment: Gait belt Activity Tolerance: Patient limited by fatigue;Patient tolerated treatment well Patient left: in chair;with call bell/phone within reach;with chair alarm set Nurse Communication: Other (comment) (SpO2 and HR) PT Visit Diagnosis: Other abnormalities of gait and mobility (R26.89);Unsteadiness on feet (R26.81);Muscle weakness (generalized) (M62.81);Difficulty in walking, not elsewhere classified (R26.2)     Time: 8583-8567 PT Time Calculation (min) (ACUTE ONLY): 16 min  Charges:    $Therapeutic Exercise: 8-22 mins PT General Charges $$ ACUTE PT VISIT: 1 Visit                     Theo Ferretti, PT, DPT Acute Rehabilitation Services  Office: [phone]    Theo CHRISTELLA Ferretti 01/10/2024, 3:26 PM

## 2024-01-10 NOTE — Plan of Care (Signed)

## 2024-01-10 NOTE — Progress Notes (Signed)
 [facility] Progress Note   Subjective:    Patient seen in his room. He denies any dyspnea and is on room air. He tolerated HD overnight and they were sucessfully able to UF 5L from him.  BP remains stable today. No other complaints or concerns voiced. Next HD 01/11/24  Objective Vitals:   01/10/24 0036 01/10/24 0409 01/10/24 0747 01/10/24 1201  BP: 128/75 (!) 133/90 115/80 119/81  Pulse: 71 71 69 67  Resp: 18 19 19 19   Temp: 98.5 F (36.9 C) 97.6 F (36.4 C) 98.2 F (36.8 C) 98 F (36.7 C)  TempSrc: Oral Oral Oral Oral  SpO2: 93% 90% 91% 93%  Weight:      Height:       Physical Exam General: Awake, alert, NAD Heart: S1 and S2; No murmurs, gallops, or rubs Lungs: (+) rales posterior lower-mid lobes Abdomen: Soft and non-tender Extremities: 2+ edema RUE; 2+ edema BLLEs Dialysis Access: [facility]   Filed Weights   01/09/24 0859 01/09/24 1254 01/09/24 1300  Weight: 130.5 kg 126.4 kg 126.4 kg    Intake/Output Summary (Last 24 hours) at 01/10/2024 1354 Last data filed at 01/10/2024 0958 Gross per 24 hour  Intake 436 ml  Output --  Net 436 ml    Additional Objective Labs: Basic Metabolic Panel: Recent Labs  Lab 01/08/24 0300 01/09/24 0255 01/10/24 0236  NA 135 135 133*  K 4.3 4.8 4.4  CL 97* 97* 92*  CO2 26 25 24   GLUCOSE 91 116* 77  BUN 42* 62* 54*  CREATININE 9.34* 12.14* 10.64*  CALCIUM  8.5* 8.4* 8.9  PHOS  --  8.1*  --    Liver Function Tests: Recent Labs  Lab 01/06/24 1553 01/09/24 0255  AST 19  --   ALT 8  --   ALKPHOS 66  --   BILITOT 0.7  --   PROT 7.3  --   ALBUMIN  3.2* 2.7*   No results for input(s): LIPASE, AMYLASE in the last 168 hours. CBC: Recent Labs  Lab 01/06/24 1553 01/07/24 0331 01/07/24 2039 01/08/24 0300 01/09/24 0255 01/10/24 0236  WBC 6.9 6.5 5.8 5.3 5.0 5.6  NEUTROABS 4.5  --   --   --   --   --   HGB 10.5* 8.5* 9.2* 8.7* 8.9* 10.0*  HCT 32.7* 25.6* 28.7* 26.8* 27.7* 30.8*  MCV 104.8* 102.4*  102.9* 103.1* 103.0* 102.0*  PLT 80* 72* 81* 80* 98* 107*   Blood Culture    Component Value Date/Time   SDES BLOOD LEFT HAND 01/06/2024 2123   SPECREQUEST  01/06/2024 2123    BOTTLES DRAWN AEROBIC AND ANAEROBIC Blood Culture adequate volume   CULT  01/06/2024 2123    NO GROWTH 4 DAYS Performed at [facility], [address], [place], [place] [address]    REPTSTATUS PENDING 01/06/2024 2123    Cardiac Enzymes: No results for input(s): CKTOTAL, CKMB, CKMBINDEX, TROPONINI in the last 168 hours. CBG: No results for input(s): GLUCAP in the last 168 hours. Iron  Studies: No results for input(s): IRON , TIBC, TRANSFERRIN, FERRITIN in the last 72 hours. Lab Results  Component Value Date   INR 2.5 (H) 04/20/2023   INR 2.6 (H) 04/19/2023   INR 2.7 (H) 04/18/2023   PROTIME 28.8 (H) 11/04/2013   PROTIME 21.6 (H) 10/14/2013   PROTIME 18.0 (H) 09/23/2013   Studies/Results: ECHOCARDIOGRAM LIMITED Result Date: 01/10/2024    ECHOCARDIOGRAM LIMITED REPORT   Patient Name:   OJANI BERENSON Date  of Exam: 01/10/2024 Medical Rec #:  [id number]      Height:       73.0 in Accession #:    [id number]     Weight:       278.7 lb Date of Birth:  17-Feb-1981     BSA:          2.477 m Patient Age:    42 years       BP:           115/80 mmHg Patient Gender: M              HR:           68 bpm. Exam Location:  Inpatient Procedure: Limited Echo (Both Spectral and Color Flow Doppler were utilized            during procedure). Indications:    mitral stenosis  History:        Patient has prior history of Echocardiogram examinations, most                 recent 01/07/2024. End stage renal disease.  Sonographer:    Tinnie Barefoot RDCS Referring Phys: [phone] BRIDGETTE CHRISTOPHER IMPRESSIONS  1. Mitral valve gradient has significantly improved, mild MS by pressure halftime. Suspect improvement due to improvement in HR from 90s to 60s. The mitral valve is abnormal. Mild mitral stenosis. The mean mitral  valve gradient is 7.0 mmHg.  2. Mild intercaviatry gradient of . Left ventricular ejection fraction, by estimation, is 65 to 70%. The left ventricle has normal function. There is moderate concentric left ventricular hypertrophy.  3. Right ventricular systolic function is normal. FINDINGS  Left Ventricle: Mild intercaviatry gradient of . Left ventricular ejection fraction, by estimation, is 65 to 70%. The left ventricle has normal function. There is moderate concentric left ventricular hypertrophy. Right Ventricle: Right ventricular systolic function is normal. Mitral Valve: Mitral valve gradient has significantly improved, mild MS by pressure halftime. Suspect improvement due to improvement in HR from 90s to 60s. The mitral valve is abnormal. There is moderate thickening of the mitral valve leaflet(s). There is mild calcification of the mitral valve leaflet(s). Moderately decreased mobility of the mitral valve leaflets. Mild mitral valve stenosis. MV peak gradient, 14.6 mmHg. The mean mitral valve gradient is 7.0 mmHg. Additional Comments: Spectral Doppler performed. Color Doppler performed.  MITRAL VALVE MV Area (PHT): 1.95 cm MV Peak grad:  14.6 mmHg MV Mean grad:  7.0 mmHg MV Vmax:       1.91 m/s MV Vmean:      124.0 cm/s [name] Electronically signed by [name] Signature Date/Time: 01/10/2024/11:11:20 AM    Final     Medications:  cefTRIAXone  (ROCEPHIN )  IV 2 g (01/10/24 1148)    apixaban   5 mg Oral BID   aspirin   81 mg Oral Daily   atorvastatin   40 mg Oral Daily   calcitRIOL   2.5 mcg Oral Q M,W,F-HD   cinacalcet   150 mg Oral Q M,W,F-HD   diphenhydrAMINE   25 mg Intravenous Once in dialysis   divalproex   1,000 mg Oral Q12H   doxycycline   100 mg Oral Q12H   feeding supplement (NEPRO CARB STEADY)  237 mL Oral BID BM   ferric citrate   840 mg Oral TID WC   levETIRAcetam   500 mg Oral BID   levETIRAcetam   500 mg Oral Once per day on Tuesday Thursday Saturday   losartan  50  mg Oral Daily   metoprolol  succinate  25  mg Oral Daily   sodium chloride  flush  10-40 mL Intracatheter Q12H    Dialysis Orders: MWF [facility] 4h   B400   108kg   2K bath  TDC   Heparin  none Prog vol^^, was 12kg over 3 wks ago and 20kg over this past week Goes to all his sessions and stays on machine   Home bp meds: Losartan 50 mg daily  Assessment/Plan: PNA: w/ fever, SOB, O2 requirement, per Primary Pulm edema: by CXR and sig vol overload/ anasarca on exam. Came off 20kg over at OP HD monday. Received extra HD 7/22 and 3.5L off. Last HD overnight 01/09/24 with 5L UF.  TTS schedule this week and switch back to MWF next week. Assess for extra HD needs. Continue to push UF. Mitral stenosis - seen on ECHO 7/22, Cardiology consulted. Limited ECHO ordered for today. ESRD: on HD MWF. Currently off schedule. See above.Next HD 01/11/24 HTN: takes ARB at home, bp's wnl here.  Volume: overloaded as noted above.  Anemia of esrd: Hb 8.5- 11 here, follow.  2nd HPTH: Corr Ca okay but phos is high. Continue Auryxia , Calcitriol , and Sensipar   Belvie Och, NP [facility] 01/10/2024,1:54 PM  LOS: 4 days

## 2024-01-10 NOTE — Progress Notes (Signed)
 Daily Progress Note Intern Pager: [phone]  Patient name: [name] [name] Medical record number: [id number] Date of birth: 17-Nov-1980 Age: 43 y.o. Gender: male  Primary Care Provider: Theotis Haze ORN, NP Consultants: Nephrology, cardiology  Code Status: FULL  Pt Overview and Major Events to Date:  - 7/21: Admitted, nephrology consulted for dialysis - 7/23: Cardiology consulted for echo findings  Medical Decision Making:  DREXLER MALAND is a 43 y.o. male admitted for fever and dyspnea 2/2 CAP vs volume overload vs inflammatory component from lupus flare. Had HD 7/24 with 5L removed. Anti ds DNA elevated, which could be indicative of lupus (inflammatory process) vs infectious process; C3, C4 still pending but if low strongly consider lupus flare.  Also concern that unmanaged OSA could be factoring into his hypoxia given known snoring and not feeling rested in the morning with BMI of 36. Some suspicion that he has hypoxia at home at baseline; no home monitoring.  Pertinent PMH/PSH includes ESRD on dialysis, HTN, hx CVA, hx seizure, lupus, antiphospholipid syndrome.  Assessment & Plan Acute hypoxic respiratory failure (HCC) Pneumonia Volume overload Improved; was on room air this morning. - Humidifying O2 given nose bleeds - Abx: IV Ceftriaxone , PO doxycycline  (7/22-), plan for 5 day course (last day 7/26) - Nephro following for volume management with dialysis - Pain: Tylenol  650 mg q6h PRN, oxycodone -acetaminophen  5-325 q6h PRN; discontinuing oxycodone  as has not taking this since 7/23 - Labs: AM RFP - Blood cultures: No growth 3 days; continue to follow - C3, C4, dsDNA pending; if indicative of lupus flare, start prednisone  60 mg daily for 5 days - Wean O2 as tolerated - Continuous pulse ox - PT/OT eval and treat Mitral stenosis Mod-severe mitral stenosis on echo 7/22. Repeat echo today 7/25 with mild mitral stenosis; suspicion that prior findings in setting of hypervolemia  and elevated HR. - Cardiology consulted, appreciate recommendations: - Toprol  25 mg daily for HR; waiting on cards recs re: continuing Anemia Hgb of 10.0 today, improved from 8.9 yesterday. Stable through whole admission, no need to continue monitoring. - Transfusion threshold <7 - Continue to monitor ESRD (end stage renal disease) [facility]) Outpatient dialysis schedule MWF; now on TTS schedule this week with plan to return to MWF next week (week of 7/28). - Continue home calcitriol , Auryxia , Sensipar  - Appreciate nephrology recommendations Prolonged Q-T interval on ECG EKG 7/23 with QTc of 475, improved. - Avoid QT prolonging medications Hypoalbuminemia Albumin  of 2.7 on 7/24. - Feeding supplement twice daily between meals (patient has been refusing) Chronic health problem Antiphospholipid syndrome  History of CVA - Eliquis  5 mg twice daily, aspirin  81 mg daily (previously noncompliant on warfarin) Seizures - Home Keppra , home Depakote  (Keppra  500 mg twice daily and 500 mg MWF after HD and Depakote  1000 mg every 12 hours for seizure prophylaxis); per patient, has been taking Keppra  twice daily every day (dialysis days included), but will keep patient on previously prescribed regimen Hypertension - Holding home losartan 50 mg daily; restart today Thrombocytopenia - Chronic, suspect 2/2 antiepileptics and/or reactive infectious etiology   FEN/GI: Renal diet PPx: Eliquis  5mg  twice daily Dispo:Home tomorrow. Barriers include need for dialysis; cardiology weighing in post-echo.   Subjective:  Patient is sleepy but responsive this morning. Denies shortness of breath, denies cough. Does report that he doesn't feel well rested in the mornings.  Reports snoring, denies known apnea.  Has never had a sleep study.  Objective: Temp:  [97.6 F (36.4 C)-98.5 F (  36.9 C)] 98 F (36.7 C) (07/25 1201) Pulse Rate:  [56-71] 67 (07/25 1201) Resp:  [11-19] 19 (07/25 1201) BP: (100-133)/(69-96) 119/81  (07/25 1201) SpO2:  [90 %-95 %] 93 % (07/25 1201) Weight:  [126.4 kg] 126.4 kg (07/24 1300) Physical Exam: General: Pt lying down in bed with head of bed raised, no acute distress. Does appear sleepy. Cardiovascular: Regular rate and rhythm, no murmurs/rubs/gallops. Respiratory: Normal work of breathing on room air. Clear to auscultation bilaterally; no wheezes, crackles. Abdomen: Bowel sounds present and normoactive bilaterally. Soft, nondistended, nontender. Extremities: Skin warm, dry. Bilateral lower extremity edema, unchanged. Neuro: Alert and appropriately responding to questions, following directions.  Laboratory: Most recent CBC Lab Results  Component Value Date   WBC 5.6 01/10/2024   HGB 10.0 (L) 01/10/2024   HCT 30.8 (L) 01/10/2024   MCV 102.0 (H) 01/10/2024   PLT 107 (L) 01/10/2024   Most recent BMP    Latest Ref Rng & Units 01/10/2024    2:36 AM  BMP  Glucose 70 - 99 mg/dL 77   BUN 6 - 20 mg/dL 54   Creatinine 9.38 - 1.24 mg/dL 89.35   Sodium 864 - 854 mmol/L 133   Potassium 3.5 - 5.1 mmol/L 4.4   Chloride 98 - 111 mmol/L 92   CO2 22 - 32 mmol/L 24   Calcium  8.9 - 10.3 mg/dL 8.9   Mag: 2.3  dsDNA Ab: 27 (elevated)   Echo 7/25: Improved MS, now mild; LVEF 65-70%, moderate concentric LVH    Larraine Palma, MD 01/10/2024, 12:19 PM  PGY-1, [facility] Family Medicine FPTS Intern pager: [phone], text pages welcome Secure chat group [facility] Teaching Service

## 2024-01-10 NOTE — Assessment & Plan Note (Addendum)
 Albumin  of 2.7 on 7/24. - Feeding supplement twice daily between meals (patient has been refusing)

## 2024-01-10 NOTE — Progress Notes (Signed)
 Occupational Therapy Treatment Patient Details Name: [name] [name] MRN: [id number] DOB: January 19, 1981 Today's Date: 01/10/2024   History of present illness [name] [name] is a 43 y.o. male admitted 01/06/24 with fever, dyspnea, desatting to the 70s per chart review in dialysis, cough, swelling BLE. Dx with acute hypoxic respiratory failure.  PMH includes ESRD on hemodialysis, AV fistula placement (Right, 12/24/2023), HTN, Lupus, Seizures (May 2025), Stroke (May 2025), and Wears glasses.   OT comments  Pt progressing towards OT goals this session. Focused on brief sink level grooming and then seated visual assessment activities:  Pt was able to complete 3 visual scanning tasks with max A. On sheet 1: he was abel to find 5/5 3's in a box of 25 numbers. with 100% accuracy he identified and counted all the 3's. However, when circling them he was only able to circle on target 3/5. On Sheet #2 in a field of 49 letters he was able to find 9/9 Q's. He was able to count all the Q's accuractely. In BOTH of these first sheets he was able to complete tasks with 100% accuracy but increased time. On Sheet #3, he failed to complete the task required and OT ended the task early due to inattention and frustration. There were 2 columns of letters, one on the Right side of the page, and one column on the left side of the page. The objective was to connect the A with the a, B with the b...etc. [name] [name] was able to identify both columns of letters reading from top to bottom. He struggled with organizational strategies, and connecting L to R. Even with max assist problem solving drawing a line from one side to the other was a real challenge for him. His performance on these tests tell us  that if he only has to identify that he is visually scanning well and has learned some adaptive strategies. However when it comes to application of items in 2 or more visual fields - he struggles with cognitive reasoning and vision impairing him for  tasks like grooming where his items might be spread around the sink/locating them to use together.   POC remains appropriate, Pt should return to post-acute rehab of <3 hours daily and next session continue to work on visual and cognitive strategies to maximize safety and independence in ADL and functional transfers.       If plan is discharge home, recommend the following:  A little help with walking and/or transfers;A little help with bathing/dressing/bathroom;Assistance with cooking/housework;Direct supervision/assist for medications management;Direct supervision/assist for financial management;Assist for transportation;Supervision due to cognitive status   Equipment Recommendations  None recommended by OT    Recommendations for Other Services PT consult    Precautions / Restrictions Precautions Precautions: Fall Recall of Precautions/Restrictions: Intact Restrictions Weight Bearing Restrictions Per Provider Order: No       Mobility Bed Mobility               General bed mobility comments: sitting EOB at end of session, up walking around the room initially    Transfers Overall transfer level: Needs assistance Equipment used: None Transfers: Sit to/from Stand Sit to Stand: Supervision           General transfer comment: supervision, no LOB noted today     Balance Overall balance assessment: Mild deficits observed, not formally tested  ADL either performed or assessed with clinical judgement   ADL Overall ADL's : Needs assistance/impaired     Grooming: Wash/dry hands;Contact guard assist;Standing Grooming Details (indicate cue type and reason): vc                 Toilet Transfer: Supervision/safety;Ambulation           Functional mobility during ADLs: Supervision/safety      Extremity/Trunk Assessment              Vision   Vision Assessment?: Vision impaired- to be further  tested in functional context Additional Comments: Pt was able to complete 3 visual scanning tasks with max A. On sheet 1: he was abel to find 5/5 3's in a box of 25 numbers. with 100% accuracy he identified and counted all the 3's. However, when circling them he was only able to circle on target 3/5. On Sheet #2 in a field of 49 letters he was able to find 9/9 Q's. He was able to count all the Q's accuractely. In BOTH of these first sheets he was able to complete tasks with 100% accuracy but increased time. On Sheet #3, he failed to complete the task required and OT ended the task early due to inattention and frustration. There were 2 columns of letters, one on the Right side of the page, and one column on the left side of the page. The objective was to connect the A with the a, B with the b...etc. [name] [name] was able to identify both columns of letters reading from top to bottom. He struggled with organizational strategies, and connecting L to R. Even with max assist problem solving drawing a line from one side to the other was a real challenge for him. His performance on these tests tell us  that if he only has to identify that he is visually scanning well and has learned some adaptive strategies. However when it comes to application of items in 2 or more visual fields - he struggles with cognitive reasoning and vision impairing him for tasks like grooming where his items might be spread around the sink/locating them to use together.   Perception     Praxis     Communication Communication Communication: Impaired Factors Affecting Communication: Reduced clarity of speech   Cognition Arousal: Alert Behavior During Therapy: Flat affect Cognition: Cognition impaired     Awareness: Online awareness impaired, Intellectual awareness intact Memory impairment (select all impairments): Working memory Attention impairment (select first level of impairment): Selective attention Executive functioning impairment  (select all impairments): Organization, Reasoning, Problem solving OT - Cognition Comments: Pt unable to complete letter connect task required max cues                 Following commands: Intact        Cueing   Cueing Techniques: Verbal cues, Visual cues, Gestural cues  Exercises      Shoulder Instructions       General Comments      Pertinent Vitals/ Pain       Pain Assessment Pain Assessment: No/denies pain  Home Living                                          Prior Functioning/Environment              Frequency  Min 2X/week  Progress Toward Goals  OT Goals(current goals can now be found in the care plan section)  Progress towards OT goals: Progressing toward goals  Acute Rehab OT Goals Patient Stated Goal: get his own place OT Goal Formulation: With patient Time For Goal Achievement: 01/21/24 Potential to Achieve Goals: Fair  Plan      Co-evaluation                 AM-PAC OT 6 Clicks Daily Activity     Outcome Measure   Help from another person eating meals?: A Little Help from another person taking care of personal grooming?: A Little Help from another person toileting, which includes using toliet, bedpan, or urinal?: A Little Help from another person bathing (including washing, rinsing, drying)?: A Little Help from another person to put on and taking off regular upper body clothing?: A Little Help from another person to put on and taking off regular lower body clothing?: A Little 6 Click Score: 18    End of Session    OT Visit Diagnosis: Unsteadiness on feet (R26.81);Muscle weakness (generalized) (M62.81);Other symptoms and signs involving cognitive function;Other symptoms and signs involving the nervous system (R29.898);Low vision, both eyes (H54.2)   Activity Tolerance Patient tolerated treatment well   Patient Left in bed;with call bell/phone within reach   Nurse Communication Mobility  status;Precautions        Time: 1110-1136 OT Time Calculation (min): 26 min  Charges: OT General Charges $OT Visit: 1 Visit OT Treatments $Self Care/Home Management : 8-22 mins $Therapeutic Activity: 8-22 mins  Leita DEL OTR/L Acute Rehabilitation Services Office: [phone]  Leita PARAS [facility] 01/10/2024, 1:46 PM

## 2024-01-10 NOTE — Assessment & Plan Note (Addendum)
 Mod-severe mitral stenosis on echo 7/22. Repeat echo today 7/25 with mild mitral stenosis; suspicion that prior findings in setting of hypervolemia and elevated HR. - Cardiology consulted, appreciate recommendations: - Toprol  25 mg daily for HR; waiting on cards recs re: continuing

## 2024-01-10 NOTE — Assessment & Plan Note (Addendum)
 Outpatient dialysis schedule MWF; now on TTS schedule this week with plan to return to MWF next week (week of 7/28). - Continue home calcitriol , Auryxia , Sensipar  - Appreciate nephrology recommendations

## 2024-01-10 NOTE — Assessment & Plan Note (Addendum)
 Hgb of 10.0 today, improved from 8.9 yesterday. Stable through whole admission, no need to continue monitoring. - Transfusion threshold <7 - Continue to monitor

## 2024-01-10 NOTE — Progress Notes (Signed)
 Brief cardiology progress note:  Repeat limited echo shows that mitral valve gradient much improved with slower heart rates. Continue metoprolol . No other testing needed at this time. Cardiology will sign off, patient should keep routine follow up with his [facility] cardiology team.  Shelda Bruckner, MD, PhD, [facility]  [facility]  [place]  Heart & Vascular at [facility] at [name] [address] [place], [place] [address] [phone]

## 2024-01-10 NOTE — Assessment & Plan Note (Signed)
 EKG 7/23 with QTc of 475, improved. - Avoid QT prolonging medications

## 2024-01-11 DIAGNOSIS — J189 Pneumonia, unspecified organism: Secondary | ICD-10-CM | POA: Diagnosis not present

## 2024-01-11 DIAGNOSIS — J9601 Acute respiratory failure with hypoxia: Secondary | ICD-10-CM | POA: Diagnosis not present

## 2024-01-11 DIAGNOSIS — N186 End stage renal disease: Secondary | ICD-10-CM | POA: Diagnosis not present

## 2024-01-11 LAB — RENAL FUNCTION PANEL
Albumin: 3 g/dL — ABNORMAL LOW (ref 3.5–5.0)
Anion gap: 14 (ref 5–15)
BUN: 80 mg/dL — ABNORMAL HIGH (ref 6–20)
CO2: 23 mmol/L (ref 22–32)
Calcium: 8.7 mg/dL — ABNORMAL LOW (ref 8.9–10.3)
Chloride: 95 mmol/L — ABNORMAL LOW (ref 98–111)
Creatinine, Ser: 13.02 mg/dL — ABNORMAL HIGH (ref 0.61–1.24)
GFR, Estimated: 4 mL/min — ABNORMAL LOW (ref >60)
Glucose, Bld: 78 mg/dL (ref 70–99)
Phosphorus: 8.4 mg/dL — ABNORMAL HIGH (ref 2.5–4.6)
Potassium: 4.7 mmol/L (ref 3.5–5.1)
Sodium: 132 mmol/L — ABNORMAL LOW (ref 135–145)

## 2024-01-11 MED ORDER — HEPARIN SODIUM (PORCINE) 1000 UNIT/ML IJ SOLN
INTRAMUSCULAR | Status: AC
  Filled 2024-01-11: qty 4

## 2024-01-11 MED ORDER — ANTICOAGULANT SODIUM CITRATE 4% (200MG/5ML) IV SOLN
5.0000 mL | Status: DC | PRN
Start: 2024-01-11 — End: 2024-01-11
  Filled 2024-01-11: qty 5

## 2024-01-11 MED ORDER — HEPARIN SODIUM (PORCINE) 1000 UNIT/ML DIALYSIS: 1000.0000 [IU] | INTRAMUSCULAR | Status: DC | PRN

## 2024-01-11 NOTE — Plan of Care (Signed)

## 2024-01-11 NOTE — Progress Notes (Signed)
 [facility] Progress Note   Subjective:    Patient seen in his room. He denies any dyspnea and is on room air. BP remains stable today. No other complaints or concerns voiced. Next HD 01/11/24. I reminded him about fluid restriction and modified his diet to include 1200 mL fluid restriction.   Objective Vitals:   01/10/24 2344 01/11/24 0406 01/11/24 0900 01/11/24 0957  BP: (!) 149/93 (!) 124/94  (!) 141/90  Pulse: 66 69  72  Resp: 17 17    Temp: 98.1 F (36.7 C) (!) 97.5 F (36.4 C)    TempSrc:      SpO2: 97% 94%    Weight:   (S) 129.5 kg   Height:       Physical Exam General: Awake, alert, NAD Heart: S1 and S2; No murmurs, gallops, or rubs Lungs: (+) rales posterior lower-mid lobes Abdomen: Soft and non-tender Extremities: 2+ edema RUE; 2+ edema BLLEs Dialysis Access: RIJ [facility]   Filed Weights   01/09/24 1254 01/09/24 1300 01/11/24 0900  Weight: 126.4 kg 126.4 kg (S) 129.5 kg    Intake/Output Summary (Last 24 hours) at 01/11/2024 1111 Last data filed at 01/11/2024 1027 Gross per 24 hour  Intake 780 ml  Output --  Net 780 ml    Additional Objective Labs: Basic Metabolic Panel: Recent Labs  Lab 01/09/24 0255 01/10/24 0236 01/11/24 0244  NA 135 133* 132*  K 4.8 4.4 4.7  CL 97* 92* 95*  CO2 25 24 23   GLUCOSE 116* 77 78  BUN 62* 54* 80*  CREATININE 12.14* 10.64* 13.02*  CALCIUM  8.4* 8.9 8.7*  PHOS 8.1*  --  8.4*   Liver Function Tests: Recent Labs  Lab 01/06/24 1553 01/09/24 0255 01/11/24 0244  AST 19  --   --   ALT 8  --   --   ALKPHOS 66  --   --   BILITOT 0.7  --   --   PROT 7.3  --   --   ALBUMIN  3.2* 2.7* 3.0*   No results for input(s): LIPASE, AMYLASE in the last 168 hours. CBC: Recent Labs  Lab 01/06/24 1553 01/07/24 0331 01/07/24 2039 01/08/24 0300 01/09/24 0255 01/10/24 0236  WBC 6.9 6.5 5.8 5.3 5.0 5.6  NEUTROABS 4.5  --   --   --   --   --   HGB 10.5* 8.5* 9.2* 8.7* 8.9* 10.0*  HCT 32.7* 25.6* 28.7* 26.8*  27.7* 30.8*  MCV 104.8* 102.4* 102.9* 103.1* 103.0* 102.0*  PLT 80* 72* 81* 80* 98* 107*   Blood Culture    Component Value Date/Time   SDES BLOOD LEFT HAND 01/06/2024 2123   SPECREQUEST  01/06/2024 2123    BOTTLES DRAWN AEROBIC AND ANAEROBIC Blood Culture adequate volume   CULT  01/06/2024 2123    NO GROWTH 4 DAYS Performed at [facility], [address] [place], [place] [address]    REPTSTATUS PENDING 01/06/2024 2123    Cardiac Enzymes: No results for input(s): CKTOTAL, CKMB, CKMBINDEX, TROPONINI in the last 168 hours. CBG: No results for input(s): GLUCAP in the last 168 hours. Iron  Studies: No results for input(s): IRON , TIBC, TRANSFERRIN, FERRITIN in the last 72 hours. Lab Results  Component Value Date   INR 2.5 (H) 04/20/2023   INR 2.6 (H) 04/19/2023   INR 2.7 (H) 04/18/2023   PROTIME 28.8 (H) 11/04/2013   PROTIME 21.6 (H) 10/14/2013   PROTIME 18.0 (H) 09/23/2013   Studies/Results: ECHOCARDIOGRAM LIMITED Result  Date: 01/10/2024    ECHOCARDIOGRAM LIMITED REPORT   Patient Name:   [name] [name] Date of Exam: 01/10/2024 Medical Rec #:  [id number]      Height:       73.0 in Accession #:    [id number]     Weight:       278.7 lb Date of Birth:  [birth year]     BSA:          2.477 m Patient Age:    42 years       BP:           115/80 mmHg Patient Gender: M              HR:           68 bpm. Exam Location:  Inpatient Procedure: Limited Echo (Both Spectral and Color Flow Doppler were utilized            during procedure). Indications:    mitral stenosis  History:        Patient has prior history of Echocardiogram examinations, most                 recent 01/07/2024. End stage renal disease.  Sonographer:    Tinnie Barefoot RDCS Referring Phys: [phone] BRIDGETTE CHRISTOPHER IMPRESSIONS  1. Mitral valve gradient has significantly improved, mild MS by pressure halftime. Suspect improvement due to improvement in HR from 90s to 60s. The mitral valve is abnormal. Mild  mitral stenosis. The mean mitral valve gradient is 7.0 mmHg.  2. Mild intercaviatry gradient of . Left ventricular ejection fraction, by estimation, is 65 to 70%. The left ventricle has normal function. There is moderate concentric left ventricular hypertrophy.  3. Right ventricular systolic function is normal. FINDINGS  Left Ventricle: Mild intercaviatry gradient of . Left ventricular ejection fraction, by estimation, is 65 to 70%. The left ventricle has normal function. There is moderate concentric left ventricular hypertrophy. Right Ventricle: Right ventricular systolic function is normal. Mitral Valve: Mitral valve gradient has significantly improved, mild MS by pressure halftime. Suspect improvement due to improvement in HR from 90s to 60s. The mitral valve is abnormal. There is moderate thickening of the mitral valve leaflet(s). There is mild calcification of the mitral valve leaflet(s). Moderately decreased mobility of the mitral valve leaflets. Mild mitral valve stenosis. MV peak gradient, 14.6 mmHg. The mean mitral valve gradient is 7.0 mmHg. Additional Comments: Spectral Doppler performed. Color Doppler performed.  MITRAL VALVE MV Area (PHT): 1.95 cm MV Peak grad:  14.6 mmHg MV Mean grad:  7.0 mmHg MV Vmax:       1.91 m/s MV Vmean:      124.0 cm/s [name] Electronically signed by [name] Signature Date/Time: 01/10/2024/11:11:20 AM    Final     Medications:  cefTRIAXone  (ROCEPHIN )  IV Stopped (01/10/24 1218)    apixaban   5 mg Oral BID   aspirin   81 mg Oral Daily   atorvastatin   40 mg Oral Daily   calcitRIOL   2.5 mcg Oral Q M,W,F-HD   Chlorhexidine  Gluconate Cloth  6 each Topical Daily   cinacalcet   150 mg Oral Q M,W,F-HD   diphenhydrAMINE   25 mg Intravenous Once in dialysis   divalproex   1,000 mg Oral Q12H   doxycycline   100 mg Oral Q12H   feeding supplement (NEPRO CARB STEADY)  237 mL Oral BID BM   ferric citrate   840 mg Oral TID WC   levETIRAcetam   500 mg Oral  BID   [  START ON 01/13/2024] levETIRAcetam   500 mg Oral Q M,W,F   losartan   50 mg Oral Daily   metoprolol  succinate  25 mg Oral Daily   sodium chloride  flush  10-40 mL Intracatheter Q12H   sodium chloride  flush  10-40 mL Intracatheter Q12H    Dialysis Orders: MWF [facility] 4h   B400   108kg   2K bath  TDC   Heparin  none Prog vol^^, was 12kg over 3 wks ago and 20kg over this past week Goes to all his sessions and stays on machine   Home bp meds: Losartan  50 mg daily  Assessment/Plan: PNA: w/ fever, SOB, O2 requirement, per Primary Pulm edema: by CXR and sig vol overload/ anasarca on exam. Came off 20kg over at OP HD monday. Received extra HD 7/22 and 3.5L off. Last HD overnight 01/09/24 with 5L UF.  TTS schedule this week and switch back to MWF next week. Assess for extra HD needs. Continue to push UF. Mitral stenosis - seen on ECHO 7/22, Cardiology consulted. Limited ECHO ordered for today. ESRD: on HD MWF. Currently off schedule. See above.Next HD 01/11/24 HTN: takes ARB at home, bp's wnl here.  Volume: overloaded as noted above.  Anemia of esrd: Hb 8.5- 11 here, follow. Hgb 10.0 today.  2nd HPTH: Corr Ca 9.5 but phos is high. Continue Auryxia , Calcitriol , and Sensipar . Switched his diet to a renal diet with fluid restriction.  Belvie Och, NP [facility] 01/11/2024,11:11 AM  LOS: 5 days

## 2024-01-11 NOTE — Assessment & Plan Note (Addendum)
 Hgb of 10.0 01/10/2024. Stable through whole admission, no need to continue monitoring. - Transfusion threshold <7 - Continue to monitor

## 2024-01-11 NOTE — Progress Notes (Signed)
 Mobility Specialist: Progress Note   01/11/24 1333  Mobility  Activity Ambulated with assistance in hallway  Level of Assistance Contact guard assist, steadying assist  Assistive Device None  Distance Ambulated (ft) 150 ft  Activity Response Tolerated well  Mobility Referral Yes  Mobility visit 1 Mobility  Mobility Specialist Start Time (ACUTE ONLY) 1135  Mobility Specialist Stop Time (ACUTE ONLY) 1146  Mobility Specialist Time Calculation (min) (ACUTE ONLY) 11 min    Pre Mobility: SpO2 94% RA Immediate Post Mobility: SpO2 86-92%   Pt received in bed sleeping but easily aroused. Agreeable to mobility session. MinG throughout, increased unsteadiness this session but pt stated it was because he was tired. SpO2 94% on RA at EOB. Unable to get a good reading during ambulation, but pulse ox read SpO2 86% on RA immediately after ambulation while pt was sitting EOB. Recovered SpO2 >90% on RA with cues for PLB. No complaints. Left pt sitting EOB with all needs met, call bell in reach.   Ileana Lute Mobility Specialist Please contact via SecureChat or Rehab office at [phone]

## 2024-01-11 NOTE — Assessment & Plan Note (Addendum)
 Improved; was on room air this morning. - Humidifying O2 given nose bleeds - Abx: IV Ceftriaxone  plan for 5 day course (last day 7/26), PO doxycycline  (7/22-7/29) - Nephro following for volume management with dialysis - Pain: Tylenol  650 mg q6h PRN - Blood cultures: No growth 3 days; continue to follow - C3, C4, dsDNA not indicative of Lupus flare - Continuous pulse ox - PT/OT eval and treat

## 2024-01-11 NOTE — Assessment & Plan Note (Signed)
 Outpatient dialysis schedule MWF; now on TTS schedule this week with plan to return to MWF next week (week of 7/28). - Continue home calcitriol , Auryxia , Sensipar  - Appreciate nephrology recommendations

## 2024-01-11 NOTE — Assessment & Plan Note (Addendum)
 Antiphospholipid syndrome  History of CVA - Eliquis  5 mg twice daily, aspirin  81 mg daily (previously noncompliant on warfarin) Seizures - Home Keppra , home Depakote  (Keppra  500 mg twice daily and 500 mg MWF after HD and Depakote  1000 mg every 12 hours for seizure prophylaxis); per patient, has been taking Keppra  twice daily every day (dialysis days included), but will keep patient on previously prescribed regimen Hypertension - home losartan  50 mg daily Thrombocytopenia - Chronic, suspect 2/2 antiepileptics and/or reactive infectious etiology

## 2024-01-11 NOTE — Assessment & Plan Note (Signed)
 Mod-severe mitral stenosis on echo 7/22. Repeat echo today 7/25 with mild mitral stenosis; suspicion that prior findings in setting of hypervolemia and elevated HR. - Cardiology consulted, appreciate recommendations: - Toprol  25 mg daily for HR; waiting on cards recs re: continuing

## 2024-01-11 NOTE — Progress Notes (Signed)
 Daily Progress Note Intern Pager: [phone]  Patient name: [name] [name] Medical record number: [id number] Date of birth: 25-Oct-1980 Age: 43 y.o. Gender: male  Primary Care Provider: Theotis [name] ORN, NP Consultants: Nephrology, cardiology Code Status: FULL  Pt Overview and Major Events to Date:  7/21: Admitted, nephrology consulted for dialysis 7/23: Cardiology consulted for echo findings   Medical Decision Making:  [name] [name] is a 43 year old male admitted for fever and dyspnea 2/2 CAP versus volume overload.  C3, C4, dsDNA came back today and were not indicative of an acute lupus flare.  Also concerned for unmanaged OSA factoring into hypoxia given patient's known snoring, not feeling rested in the morning, BMI of 36.  Suspicion that patient has hypoxia at baseline, no home monitoring.  Pertinent PMH/PSH includes ESRD on dialysis, HTN, history of CVA, history of seizure, lupus, antiphospholipid syndrome.  Assessment & Plan Acute hypoxic respiratory failure (HCC) Pneumonia Volume overload Improved; was on room air this morning. - Humidifying O2 given nose bleeds - Abx: IV Ceftriaxone  plan for 5 day course (last day 7/26), PO doxycycline  (7/22-7/29) - Nephro following for volume management with dialysis - Pain: Tylenol  650 mg q6h PRN - Blood cultures: No growth 3 days; continue to follow - C3, C4, dsDNA not indicative of Lupus flare - Continuous pulse ox - PT/OT eval and treat Mitral stenosis Mod-severe mitral stenosis on echo 7/22. Repeat echo today 7/25 with mild mitral stenosis; suspicion that prior findings in setting of hypervolemia and elevated HR. - Cardiology consulted, appreciate recommendations: - Toprol  25 mg daily for HR; waiting on cards recs re: continuing Anemia Hgb of 10.0 01/10/2024. Stable through whole admission, no need to continue monitoring. - Transfusion threshold <7 - Continue to monitor ESRD (end stage renal disease) [facility]) Outpatient  dialysis schedule MWF; now on TTS schedule this week with plan to return to MWF next week (week of 7/28). - Continue home calcitriol , Auryxia , Sensipar  - Appreciate nephrology recommendations Prolonged Q-T interval on ECG EKG 7/23 with QTc of 475, improved. - Avoid QT prolonging medications Hypoalbuminemia Albumin  of 2.7 on 7/24. - Feeding supplement twice daily between meals (patient has been refusing) Chronic health problem Antiphospholipid syndrome  History of CVA - Eliquis  5 mg twice daily, aspirin  81 mg daily (previously noncompliant on warfarin) Seizures - Home Keppra , home Depakote  (Keppra  500 mg twice daily and 500 mg MWF after HD and Depakote  1000 mg every 12 hours for seizure prophylaxis); per patient, has been taking Keppra  twice daily every day (dialysis days included), but will keep patient on previously prescribed regimen Hypertension - home losartan  50 mg daily Thrombocytopenia - Chronic, suspect 2/2 antiepileptics and/or reactive infectious etiology    FEN/GI: Renal diet PPx: Eliquis  5 mg twice daily  Dispo: Home pending dialysis.  Subjective:  Patient was seen sleeping in bed this morning.  His breakfast tray still in front of him mostly eaten.  He was arousable.  Patient denied any pain concerns at this time.  Objective: Temp:  [97.5 F (36.4 C)-98.1 F (36.7 C)] 97.5 F (36.4 C) (07/26 0406) Pulse Rate:  [66-74] 69 (07/26 0406) Resp:  [17-19] 17 (07/26 0406) BP: (119-149)/(81-94) 124/94 (07/26 0406) SpO2:  [93 %-97 %] 94 % (07/26 0406) Physical Exam: General: Sleepy, arousable, NAD Cardiovascular: RRR, no M/R/G Respiratory: CTAB, normal work of breathing on room air Abdomen: Round, nondistended, nontender to palpation, bowel sounds present Extremities: Mild bilateral lower extremity edema  Laboratory: Most recent CBC Lab Results  Component Value Date   WBC 5.6 01/10/2024   HGB 10.0 (L) 01/10/2024   HCT 30.8 (L) 01/10/2024   MCV 102.0 (H) 01/10/2024    PLT 107 (L) 01/10/2024   Most recent BMP    Latest Ref Rng & Units 01/11/2024    2:44 AM  BMP  Glucose 70 - 99 mg/dL 78   BUN 6 - 20 mg/dL 80   Creatinine 9.38 - 1.24 mg/dL 86.97   Sodium 864 - 854 mmol/L 132   Potassium 3.5 - 5.1 mmol/L 4.7   Chloride 98 - 111 mmol/L 95   CO2 22 - 32 mmol/L 23   Calcium  8.9 - 10.3 mg/dL 8.7    Imaging/Diagnostic Tests: No new imaging   [name] Raguel MATSU, DO 01/11/2024, 7:48 AM  PGY-1, [facility] FPTS Intern pager: [phone], text pages welcome Secure chat group [facility] Teaching Service

## 2024-01-11 NOTE — Assessment & Plan Note (Signed)
 EKG 7/23 with QTc of 475, improved. - Avoid QT prolonging medications

## 2024-01-11 NOTE — Progress Notes (Signed)
 Charge Nurse and IV team made aware. IV in upper left arm partially removed upon assessment this morning and bleeding. IV removed direct pressure and gauze placed on site. Continued to actively bleed. More gauze and pressure applied. Continuing to monitor.

## 2024-01-11 NOTE — Assessment & Plan Note (Signed)
 Albumin  of 2.7 on 7/24. - Feeding supplement twice daily between meals (patient has been refusing)

## 2024-01-11 NOTE — Procedures (Signed)
 HD Note:  Some information was entered later than the data was gathered due to patient care needs. The stated time with the data is accurate.  Received patient in bed to unit.   Alert and oriented.   Informed consent signed and in chart.   Access used: Upper right chest HD catheter Access issues: None  Patient tolerated treatment well.   There was a period of time where BP was not documented.  See flowsheet.  Patient did not have any signs or symptoms of distress.  Patient reports no discomfort or change of condition during this time frame.  TX duration: 3 hours  Alert, without acute distress.  Total UF removed: 5000 ml  Hand-off given to patient's nurse.   Transported back to the room   Alexxus Sobh L. Lenon, RN [facility].

## 2024-01-12 DIAGNOSIS — J9601 Acute respiratory failure with hypoxia: Secondary | ICD-10-CM | POA: Diagnosis not present

## 2024-01-12 DIAGNOSIS — N186 End stage renal disease: Secondary | ICD-10-CM | POA: Diagnosis not present

## 2024-01-12 DIAGNOSIS — J189 Pneumonia, unspecified organism: Secondary | ICD-10-CM | POA: Diagnosis not present

## 2024-01-12 MED ORDER — IMIQUIMOD 5 % EX CREA: TOPICAL_CREAM | CUTANEOUS | 0 refills | Status: DC

## 2024-01-12 MED ORDER — METOPROLOL SUCCINATE ER 25 MG PO TB24
25.0000 mg | ORAL_TABLET | Freq: Every day | ORAL | 0 refills | Status: DC
Start: 2024-01-13 — End: 2024-01-13

## 2024-01-12 MED ORDER — LEVETIRACETAM 500 MG PO TABS: 500.0000 mg | ORAL_TABLET | Freq: Two times a day (BID) | ORAL | Status: AC

## 2024-01-12 MED ORDER — ASPIRIN 81 MG PO CHEW: 81.0000 mg | CHEWABLE_TABLET | Freq: Every day | ORAL | 0 refills | Status: DC

## 2024-01-12 NOTE — Assessment & Plan Note (Signed)
 Albumin  of 3.0 on 7/26. - Feeding supplement twice daily between meals (patient has been refusing)

## 2024-01-12 NOTE — Progress Notes (Signed)
 Daily Progress Note Intern Pager: [phone]  Patient name: [name] [name] Medical record number: [id number] Date of birth: [birth year] Age: 43 y.o. Gender: male  Primary Care Provider: Theotis Haze ORN, NP Consultants: Nephrology, cardiology (signed-off) Code Status: FULL  Pt Overview and Major Events to Date:  7/21: Admitted, nephrology consulted for dialysis 7/23: Cardiology consulted for echo findings  7/25: Repeat echo improved, cards signed off  Medical Decision Making:  [name] [name] is a 43 y.o. male admitted for fever and dyspnea 2/2 CAP versus volume overload. Also concern for [name] [name], will investigate further outpatient. C3, C4, dsDNA not indicative of Lupus flare.  Patient medically clear and awaiting discharge to either SNF versus home. Discussed this with patient this morning, and he is hesitant to pursue SNF but does acknowledge prior help needed with daily tasks while inpatient and how he lives alone and would not have this support at home. Will ask PT/OT to reassess patient today to cement discharge plan.  Pertinent PMH/PSH includes ESRD on dialysis, HTN, history of CVA, history of seizure, lupus, antiphospholipid syndrome.  Assessment & Plan Acute hypoxic respiratory failure (HCC) (Resolved: 01/12/2024) Pneumonia (Resolved: 01/12/2024) Volume overload Remains improved and on room air; antibiotic course completed 7/26. - Nephro following for volume management with dialysis - Pain: Tylenol  650 mg q6h PRN - Blood cultures: No growth 4 days; continue to follow until complete - PT/OT eval and treat; hopefully reassessing today Mitral stenosis Mod-severe mitral stenosis on echo 7/22. Repeat echo today 7/25 with mild mitral stenosis; suspicion that prior findings in setting of hypervolemia and elevated HR. - Toprol  25 mg daily for HR to continue per cards ESRD (end stage renal disease) [facility]) Outpatient dialysis schedule MWF. Received TTS dialysis week of 7/21 with plan to  return to MWF schedule for week of 7/28. - Continue home calcitriol , Auryxia , Sensipar  - Appreciate nephrology recommendations Prolonged Q-T interval on ECG EKG 7/23 with QTc of 475, improved. - Avoid QT prolonging medications Hypoalbuminemia Albumin  of 3.0 on 7/26. - Feeding supplement twice daily between meals (patient has been refusing) Chronic health problem Antiphospholipid syndrome  History of CVA - Eliquis  5 mg twice daily, aspirin  81 mg daily (previously noncompliant on warfarin) Seizures - Home Keppra , home Depakote  (Keppra  500 mg twice daily and 500 mg MWF after HD and Depakote  1000 mg every 12 hours for seizure prophylaxis); per patient, has been taking Keppra  twice daily every day (dialysis days included), but will keep patient on previously prescribed regimen Hypertension - Home losartan  50 mg daily Thrombocytopenia - Chronic, suspect 2/2 antiepileptics and/or reactive infectious etiology Anemia - Suspect chronic in setting of ESRD on dialysis; stable throughout admission  FEN/GI: Renal PPx: Eliquis  5 mg twice daily Dispo: Home versus SNF pending repeat PT/OT eval  Subjective:  Patient is doing well today, no concerns. Explained PT/OT recommendation for SNF, patient is hesitant. He does live alone and would thus not have assistance with daily tasks.  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  Objective: Temp:  [97.5 F (36.4 C)-98.3 F (36.8 C)] 98 F (36.7 C) (07/27 0740) Pulse Rate:  [10-177] 77 (07/27 0834) Resp:  [10-68] 19 (07/27 0740) BP: (91-147)/(57-100) 131/88 (07/27 0834) SpO2:  [92 %-100 %] 95 % (07/27 0740) Weight:  [124.8 kg-129.1 kg] 124.8 kg (07/26 2130) Physical Exam: General: Patient seated in recliner, no acute distress Cardiovascular: Regular  rate and rhythm, no murmurs/rubs/gallops. Respiratory: Normal work of breathing on room air.  Clear to auscultation bilaterally; no wheezes, crackles. Abdomen: Bowel sounds present and normoactive bilaterally. Soft, nondistended, nontender. Extremities: Skin warm, dry. Stable bilateral lower extremity edema. Neuro: Alert and appropriately responding to questions.  Laboratory: Most recent CBC Lab Results  Component Value Date   WBC 5.6 01/10/2024   HGB 10.0 (L) 01/10/2024   HCT 30.8 (L) 01/10/2024   MCV 102.0 (H) 01/10/2024   PLT 107 (L) 01/10/2024   Most recent BMP    Latest Ref Rng & Units 01/11/2024    2:44 AM  BMP  Glucose 70 - 99 mg/dL 78   BUN 6 - 20 mg/dL 80   Creatinine 9.38 - 1.24 mg/dL 86.97   Sodium 864 - 854 mmol/L 132   Potassium 3.5 - 5.1 mmol/L 4.7   Chloride 98 - 111 mmol/L 95   CO2 22 - 32 mmol/L 23   Calcium  8.9 - 10.3 mg/dL 8.7      Larraine Palma, MD 01/12/2024, 9:46 AM  PGY-1, [place] [facility] FPTS Intern pager: [phone], text pages welcome Secure chat group [facility] Teaching Service

## 2024-01-12 NOTE — Assessment & Plan Note (Signed)
 Outpatient dialysis schedule MWF. Received TTS dialysis week of 7/21 with plan to return to MWF schedule for week of 7/28. - Continue home calcitriol , Auryxia , Sensipar  - Appreciate nephrology recommendations

## 2024-01-12 NOTE — Assessment & Plan Note (Addendum)
 Remains improved and on room air; antibiotic course completed 7/26. - Nephro following for volume management with dialysis - Pain: Tylenol  650 mg q6h PRN - Blood cultures: No growth 4 days; continue to follow until complete - PT/OT eval and treat; hopefully reassessing today

## 2024-01-12 NOTE — Assessment & Plan Note (Signed)
 Mod-severe mitral stenosis on echo 7/22. Repeat echo today 7/25 with mild mitral stenosis; suspicion that prior findings in setting of hypervolemia and elevated HR. - Toprol  25 mg daily for HR to continue per cards

## 2024-01-12 NOTE — Assessment & Plan Note (Signed)
 EKG 7/23 with QTc of 475, improved. - Avoid QT prolonging medications

## 2024-01-12 NOTE — Progress Notes (Signed)
 [facility] Progress Note   Subjective:    Patient seen in his room. He denies any dyspnea and is on room air. BP remains stable today. No other complaints or concerns voiced. Last HD 01/11/24 with 5L UF. From nephrology standpoint, this patient is okay to continue OP HD.  Objective Vitals:   01/11/24 2337 01/12/24 0316 01/12/24 0740 01/12/24 0834  BP: (!) 91/59 110/76 131/88 131/88  Pulse: 81 69 77 77  Resp: 20 20 19    Temp: 98.3 F (36.8 C) 97.8 F (36.6 C) 98 F (36.7 C)   TempSrc:   Oral   SpO2: 94% 96% 95%   Weight:      Height:       Physical Exam General: Awake, alert, NAD Heart: S1 and S2; No murmurs, gallops, or rubs Lungs: (+) rales posterior lower-mid lobes Abdomen: Soft and non-tender Extremities: 2+ edema RUE; 2+ edema BLLEs Dialysis Access: RIJ [facility]   Filed Weights   01/11/24 0900 01/11/24 1823 01/11/24 2130  Weight: (S) 129.5 kg 129.1 kg 124.8 kg    Intake/Output Summary (Last 24 hours) at 01/12/2024 1228 Last data filed at 01/12/2024 1043 Gross per 24 hour  Intake 380 ml  Output 5000 ml  Net -4620 ml    Additional Objective Labs: Basic Metabolic Panel: Recent Labs  Lab 01/09/24 0255 01/10/24 0236 01/11/24 0244  NA 135 133* 132*  K 4.8 4.4 4.7  CL 97* 92* 95*  CO2 25 24 23   GLUCOSE 116* 77 78  BUN 62* 54* 80*  CREATININE 12.14* 10.64* 13.02*  CALCIUM  8.4* 8.9 8.7*  PHOS 8.1*  --  8.4*   Liver Function Tests: Recent Labs  Lab 01/06/24 1553 01/09/24 0255 01/11/24 0244  AST 19  --   --   ALT 8  --   --   ALKPHOS 66  --   --   BILITOT 0.7  --   --   PROT 7.3  --   --   ALBUMIN  3.2* 2.7* 3.0*   No results for input(s): LIPASE, AMYLASE in the last 168 hours. CBC: Recent Labs  Lab 01/06/24 1553 01/07/24 0331 01/07/24 2039 01/08/24 0300 01/09/24 0255 01/10/24 0236  WBC 6.9 6.5 5.8 5.3 5.0 5.6  NEUTROABS 4.5  --   --   --   --   --   HGB 10.5* 8.5* 9.2* 8.7* 8.9* 10.0*  HCT 32.7* 25.6* 28.7* 26.8* 27.7* 30.8*   MCV 104.8* 102.4* 102.9* 103.1* 103.0* 102.0*  PLT 80* 72* 81* 80* 98* 107*   Blood Culture    Component Value Date/Time   SDES BLOOD LEFT HAND 01/06/2024 2123   SPECREQUEST  01/06/2024 2123    BOTTLES DRAWN AEROBIC AND ANAEROBIC Blood Culture adequate volume   CULT  01/06/2024 2123    NO GROWTH 4 DAYS Performed at [facility], [address], [place], [place] [address]    REPTSTATUS PENDING 01/06/2024 2123    Cardiac Enzymes: No results for input(s): CKTOTAL, CKMB, CKMBINDEX, TROPONINI in the last 168 hours. CBG: No results for input(s): GLUCAP in the last 168 hours. Iron  Studies: No results for input(s): IRON , TIBC, TRANSFERRIN, FERRITIN in the last 72 hours. Lab Results  Component Value Date   INR 2.5 (H) 04/20/2023   INR 2.6 (H) 04/19/2023   INR 2.7 (H) 04/18/2023   PROTIME 28.8 (H) 11/04/2013   PROTIME 21.6 (H) 10/14/2013   PROTIME 18.0 (H) 09/23/2013   Studies/Results: No results found.   Medications:  apixaban   5 mg Oral BID   aspirin   81 mg Oral Daily   atorvastatin   40 mg Oral Daily   calcitRIOL   2.5 mcg Oral Q M,W,F-HD   Chlorhexidine  Gluconate Cloth  6 each Topical Daily   cinacalcet   150 mg Oral Q M,W,F-HD   diphenhydrAMINE   25 mg Intravenous Once in dialysis   divalproex   1,000 mg Oral Q12H   feeding supplement (NEPRO CARB STEADY)  237 mL Oral BID BM   ferric citrate   840 mg Oral TID WC   levETIRAcetam   500 mg Oral BID   [START ON 01/13/2024] levETIRAcetam   500 mg Oral Q M,W,F   losartan   50 mg Oral Daily   metoprolol  succinate  25 mg Oral Daily   sodium chloride  flush  10-40 mL Intracatheter Q12H    Dialysis Orders: MWF [facility] 4h   B400   108kg   2K bath  TDC   Heparin  none Prog vol^^, was 12kg over 3 wks ago and 20kg over this past week Goes to all his sessions and stays on machine   Home bp meds: Losartan  50 mg daily  Assessment/Plan: PNA: w/ fever, SOB, O2 requirement, per Primary Pulm edema: by CXR and sig vol  overload/ anasarca on exam. Came off 20kg over at OP HD monday. Received extra HD 7/22 and 3.5L off. Last HD overnight 01/09/24 with 5L UF.  TTS schedule this week and switch back to MWF next week. Assess for extra HD needs. Continue to push UF. Mitral stenosis - seen on ECHO 7/22, Cardiology consulted. Limited ECHO ordered for today. ESRD: on HD MWF. Currently off schedule. See above. Last HD 01/11/24 HTN: takes ARB at home, bp's wnl here.  Volume: overload is resolved. Continue fluid restriction Anemia of esrd: Hb 8.5- 11 here, follow. Hgb 10 2nd HPTH: Corr Ca 9.5 but phos is high. Continue Auryxia , Calcitriol , and Sensipar . Switched his diet to a renal diet with fluid restriction.  Belvie Och, NP [facility] 01/12/2024,12:28 PM  LOS: 6 days

## 2024-01-12 NOTE — Assessment & Plan Note (Signed)
 Antiphospholipid syndrome  History of CVA - Eliquis  5 mg twice daily, aspirin  81 mg daily (previously noncompliant on warfarin) Seizures - Home Keppra , home Depakote  (Keppra  500 mg twice daily and 500 mg MWF after HD and Depakote  1000 mg every 12 hours for seizure prophylaxis); per patient, has been taking Keppra  twice daily every day (dialysis days included), but will keep patient on previously prescribed regimen Hypertension - Home losartan  50 mg daily Thrombocytopenia - Chronic, suspect 2/2 antiepileptics and/or reactive infectious etiology Anemia - Suspect chronic in setting of ESRD on dialysis; stable throughout admission

## 2024-01-12 NOTE — Discharge Summary (Incomplete Revision)
 Family Medicine Teaching [facility] Discharge Summary  Patient name: [name] [name] Medical record number: [id number] Date of birth: 02-Mar-1981 Age: 43 y.o. Gender: male Date of Admission: 01/06/2024  Date of Discharge: 01/12/2024 Admitting Physician: Ozell Provencal, MD  Primary Care Provider: Theotis Haze ORN, NP Consultants: Nephrology, cardiology  Indication for Hospitalization: acute hypoxic respiratory failure  Discharge Diagnoses/Problem List:  Principal Problem for Admission: Acute hypoxic respiratory failure Other Problems addressed during stay:  Active Problems:   Thrombocytopenia (HCC)   ESRD (end stage renal disease) (HCC)   Anemia   Volume overload   Pneumonia due to infectious organism   Prolonged Q-T interval on ECG   Aplastic anemia (HCC)   Mitral stenosis   Hypoalbuminemia   Brief Hospital Course:  [name] [name] is a 43 y.o. year old with a history of ESRD on dialysis, HTN, hx CVA, hx seizure, lupus, antiphospholipid syndrome who presented with fever and dyspnea and was admitted to the [facility] Medicine Teaching Service for hypoxic respiratory failure and CAP.  Acute Hypoxic Respiratory Failure Community Acquired Pneumonia Presented from HD, where he'd received vancomycin  and ceftazidime once, with concern for CAP given infectious symptoms and fever. On admission, he had O2 requirement with 4L [place] (no home O2 requirement); transitioned to room air on 7/25. Patient started on IV Ceftriaxone , doxycycline  to completed 5 day course (7/22-7/26). Given continued hypoxia with O2 requirements, CT PE done 7/22 which showed no PE. Suspicion for possible lupus flare contributing to symptoms given echo 7/22 with right atrial pressure of 15 mmHg, so lupus labs done which did not indicate flare. Likely primarily CAP and volume overload improved with dialysis.  Mitral Stenosis Echo done 7/22 showed moderate to severe mitral stenosis. Cardiology consulted, and recommended  starting Toprol  25 mg daily and repeating echo prior to discharge when HR is down and he is less fluid overloaded. Repeat echo done 7/25 showed improvement to mild mitral stenosis. Cardiology recommended continuing Toprol  and continued routine outpatient follow up. Patient provided with three days worth of metoprolol  given only has mail order pharmacy and without transportation to a pharmacy; will bridge gap between discharge and meds arriving from mail order pharmacy.  Anemia Hgb dropped from 10.5 to 8.5 on 7/22; remained stable following.  ESRD Patient on outpatient MWF dialysis schedule. Received extra dialysis on Tues 7/22 given fluid overloaded; kept on TTS schedule while inpatient. Will return to MWF schedule outpatient.   Other chronic conditions were medically managed with home medications and formulary alternatives as necessary (Hx CVA, seizures, HTN, thrombocytopenia, antiphospholipid syndrome).  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; discharged on Imiquimod  Outpatient referral to PT/OT     Results/Tests Pending at Time of Discharge:  - Blood Cultures: No growth 4 days on 7/27 Unresulted Labs (From admission, onward)    None        Disposition: Group Home (following discussion with patient about SNF recommendations from PT/OT, patient elected to return home)  Discharge Condition: Stable  Discharge Exam:  Vitals:   01/12/24 0740 01/12/24 0834  BP: 131/88 131/88  Pulse: 77 77  Resp: 19   Temp: 98 F (36.7 C)   SpO2: 95%    Physical Exam (from earlier 7/27 progress note): General: Patient seated in recliner, no acute distress Cardiovascular: Regular  rate and rhythm, no  murmurs/rubs/gallops. Respiratory: Normal work of breathing on room air. Clear to auscultation bilaterally; no wheezes, crackles. Abdomen: Bowel sounds present and normoactive bilaterally. Soft, nondistended, nontender. Extremities: Skin warm, dry. Stable bilateral lower extremity edema. Neuro: Alert and appropriately responding to questions.  Significant Procedures: None  Significant Labs and Imaging:  No results for input(s): WBC, HGB, HCT, PLT in the last 48 hours. Recent Labs  Lab 01/11/24 0244  NA 132*  K 4.7  CL 95*  CO2 23  GLUCOSE 78  BUN 80*  CREATININE 13.02*  CALCIUM  8.7*  PHOS 8.4*  ALBUMIN  3.0*   CRP: 98.11 BNP: 846.1  dsDNA: 27 C3: 138  C4: 19  CT PE 7/23: 1. No evidence of pulmonary embolism. 2. Chronic mediastinal lymphadenopathy, mildly progressive. Reactive nodes are favored, although low grade lymphoproliferative disorder is technically possible. Consider follow-up CT chest with contrast in 3-6 months.   Discharge Medications:  Allergies as of 01/12/2024       Reactions   Cetirizine & Related Swelling   Hibiclens  [chlorhexidine  Gluconate] Itching        Medication List     STOP taking these medications    oxyCODONE -acetaminophen  5-325 MG tablet Commonly known as: Percocet       TAKE these medications    acetaminophen  325 MG tablet Commonly known as: TYLENOL  Take 2 tablets (650 mg total) by mouth every 6 (six) hours as needed for mild pain.   apixaban  5 MG Tabs tablet Commonly known as: Eliquis  Take 1 tablet (5 mg total) by mouth 2 (two) times daily.   aspirin  81 MG chewable tablet Chew 1 tablet (81 mg total) by mouth daily. Start taking on: January 13, 2024   atorvastatin  40 MG tablet Commonly known as: LIPITOR Take 1 tablet (40 mg total) by mouth daily.   Auryxia  1 GM 210 MG(Fe) tablet Generic drug: ferric citrate  Take 840 mg by mouth 3 (three) times daily with meals.   calcitRIOL  0.5 MCG capsule Commonly known  as: ROCALTROL  Take 5 capsules (2.5 mcg total) by mouth every Monday, Wednesday, and Friday with hemodialysis.   cinacalcet  30 MG tablet Commonly known as: SENSIPAR  Take 5 tablets (150 mg total) by mouth every Monday, Wednesday, and Friday with hemodialysis.   divalproex  500 MG DR tablet Commonly known as: DEPAKOTE  Take 2 tablets (1,000 mg total) by mouth every 12 (twelve) hours.   imiquimod  5 % cream Commonly known as: Aldara  Apply to affected area three times weekly for 8 weeks.   levETIRAcetam  500 MG tablet Commonly known as: KEPPRA  Take 1 tablet (500 mg total) by mouth every Monday, Wednesday, and Friday with hemodialysis. What changed:  when to take this additional instructions   levETIRAcetam  500 MG tablet Commonly known as: Keppra  Take 1 tablet (500 mg total) by mouth 2 (two) times daily. What changed:  when to take this additional instructions   losartan  50 MG tablet Commonly known as: COZAAR  Take 50 mg by mouth daily.   metoprolol  succinate 25 MG 24 hr tablet Commonly known as: TOPROL -XL Take 1 tablet (25 mg total) by mouth daily. Start taking on: January 13, 2024   multivitamin with minerals tablet Take 1 tablet by mouth daily.        Discharge Instructions: Please refer to Patient Instructions section of EMR for full details.  Patient was counseled important signs and symptoms that should prompt return to medical care, changes in medications, dietary instructions, activity restrictions, and follow up appointments.  Follow-Up Appointments: Follow-up with PCP in 1-2 weeks.  Larraine Palma, MD 01/12/2024, 4:15 PM PGY-1, [facility]

## 2024-01-12 NOTE — Discharge Planning (Signed)
 [facility] Patient Discharge Orders - [facility] CLINIC: [facility]  Patient's name: [name] [name] Admit/DC Dates: 01/06/2024 -   DISCHARGE DIAGNOSES: Acute hypoxic respiratory failure: concern for CAP - given ceftriaxone  and doxycycline . He completed course of antibiotics.   Mitral Stenosis ESRD on HD  HD ORDER CHANGES: Heparin  change: no change EDW Change: yes New EDW: 119.8 - reassess this at HD. Was a bed weight - continue to challenge him for max UF.  Bath Change: no change  ANEMIA MANAGEMENT: Aranesp : Given: no    ESA dose for discharge: mircera per protocol - last dose 12/25/23 IV Iron  dose at discharge: 50 mg due at HD Transfusion: Given: no  BONE/MINERAL MEDICATIONS: Hectorol /Calcitriol  change: no change Sensipar /Parsabiv change: no change  ACCESS INTERVENTION/CHANGE: no Details:   RECENT LABS: Recent Labs  Lab 01/10/24 0236 01/11/24 0244  HGB 10.0*  --   NA 133* 132*  K 4.4 4.7  CALCIUM  8.9 8.7*  PHOS  --  8.4*  ALBUMIN   --  3.0*    IV ANTIBIOTICS: completed IV antibiotics  OTHER/APPTS/LAB ORDERS: Please follow-up with PCP in 1-2 weeks. Please get updated labs at HD   D/C Meds to be reconciled by nurse after every discharge.  Completed By: Belvie Och, NP   Reviewed by: MD:______ RN_______

## 2024-01-12 NOTE — Discharge Summary (Deleted)
 Family Medicine Teaching [facility] Discharge Summary  Patient name: [name] [name] Medical record number: [id number] Date of birth: 02-Mar-1981 Age: 43 y.o. Gender: male Date of Admission: 01/06/2024  Date of Discharge: 01/12/2024 Admitting Physician: Ozell Provencal, MD  Primary Care Provider: Theotis Haze ORN, NP Consultants: Nephrology, cardiology  Indication for Hospitalization: acute hypoxic respiratory failure  Discharge Diagnoses/Problem List:  Principal Problem for Admission: Acute hypoxic respiratory failure Other Problems addressed during stay:  Active Problems:   Thrombocytopenia (HCC)   ESRD (end stage renal disease) (HCC)   Anemia   Volume overload   Pneumonia due to infectious organism   Prolonged Q-T interval on ECG   Aplastic anemia (HCC)   Mitral stenosis   Hypoalbuminemia   Brief Hospital Course:  [name] [name] is a 43 y.o. year old with a history of ESRD on dialysis, HTN, hx CVA, hx seizure, lupus, antiphospholipid syndrome who presented with fever and dyspnea and was admitted to the [facility] Medicine Teaching Service for hypoxic respiratory failure and CAP.  Acute Hypoxic Respiratory Failure Community Acquired Pneumonia Presented from HD, where he'd received vancomycin  and ceftazidime once, with concern for CAP given infectious symptoms and fever. On admission, he had O2 requirement with 4L [place] (no home O2 requirement); transitioned to room air on 7/25. Patient started on IV Ceftriaxone , doxycycline  to completed 5 day course (7/22-7/26). Given continued hypoxia with O2 requirements, CT PE done 7/22 which showed no PE. Suspicion for possible lupus flare contributing to symptoms given echo 7/22 with right atrial pressure of 15 mmHg, so lupus labs done which did not indicate flare. Likely primarily CAP and volume overload improved with dialysis.  Mitral Stenosis Echo done 7/22 showed moderate to severe mitral stenosis. Cardiology consulted, and recommended  starting Toprol  25 mg daily and repeating echo prior to discharge when HR is down and he is less fluid overloaded. Repeat echo done 7/25 showed improvement to mild mitral stenosis. Cardiology recommended continuing Toprol  and continued routine outpatient follow up. Patient provided with three days worth of metoprolol  given only has mail order pharmacy and without transportation to a pharmacy; will bridge gap between discharge and meds arriving from mail order pharmacy.  Anemia Hgb dropped from 10.5 to 8.5 on 7/22; remained stable following.  ESRD Patient on outpatient MWF dialysis schedule. Received extra dialysis on Tues 7/22 given fluid overloaded; kept on TTS schedule while inpatient. Will return to MWF schedule outpatient.   Other chronic conditions were medically managed with home medications and formulary alternatives as necessary (Hx CVA, seizures, HTN, thrombocytopenia, antiphospholipid syndrome).  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; discharged on Imiquimod  Outpatient referral to PT/OT     Results/Tests Pending at Time of Discharge:  - Blood Cultures: No growth 4 days on 7/27 Unresulted Labs (From admission, onward)    None        Disposition: Group Home (following discussion with patient about SNF recommendations from PT/OT, patient elected to return home)  Discharge Condition: Stable  Discharge Exam:  Vitals:   01/12/24 0740 01/12/24 0834  BP: 131/88 131/88  Pulse: 77 77  Resp: 19   Temp: 98 F (36.7 C)   SpO2: 95%    Physical Exam (from earlier 7/27 progress note): General: Patient seated in recliner, no acute distress Cardiovascular: Regular  rate and rhythm, no  murmurs/rubs/gallops. Respiratory: Normal work of breathing on room air. Clear to auscultation bilaterally; no wheezes, crackles. Abdomen: Bowel sounds present and normoactive bilaterally. Soft, nondistended, nontender. Extremities: Skin warm, dry. Stable bilateral lower extremity edema. Neuro: Alert and appropriately responding to questions.  Significant Procedures: None  Significant Labs and Imaging:  No results for input(s): WBC, HGB, HCT, PLT in the last 48 hours. Recent Labs  Lab 01/11/24 0244  NA 132*  K 4.7  CL 95*  CO2 23  GLUCOSE 78  BUN 80*  CREATININE 13.02*  CALCIUM  8.7*  PHOS 8.4*  ALBUMIN  3.0*   CRP: 98.11 BNP: 846.1  dsDNA: 27 C3: 138  C4: 19  CT PE 7/23: 1. No evidence of pulmonary embolism. 2. Chronic mediastinal lymphadenopathy, mildly progressive. Reactive nodes are favored, although low grade lymphoproliferative disorder is technically possible. Consider follow-up CT chest with contrast in 3-6 months.   Discharge Medications:  Allergies as of 01/12/2024       Reactions   Cetirizine & Related Swelling   Hibiclens  [chlorhexidine  Gluconate] Itching        Medication List     STOP taking these medications    oxyCODONE -acetaminophen  5-325 MG tablet Commonly known as: Percocet       TAKE these medications    acetaminophen  325 MG tablet Commonly known as: TYLENOL  Take 2 tablets (650 mg total) by mouth every 6 (six) hours as needed for mild pain.   apixaban  5 MG Tabs tablet Commonly known as: Eliquis  Take 1 tablet (5 mg total) by mouth 2 (two) times daily.   aspirin  81 MG chewable tablet Chew 1 tablet (81 mg total) by mouth daily. Start taking on: January 13, 2024   atorvastatin  40 MG tablet Commonly known as: LIPITOR Take 1 tablet (40 mg total) by mouth daily.   Auryxia  1 GM 210 MG(Fe) tablet Generic drug: ferric citrate  Take 840 mg by mouth 3 (three) times daily with meals.   calcitRIOL  0.5 MCG capsule Commonly known  as: ROCALTROL  Take 5 capsules (2.5 mcg total) by mouth every Monday, Wednesday, and Friday with hemodialysis.   cinacalcet  30 MG tablet Commonly known as: SENSIPAR  Take 5 tablets (150 mg total) by mouth every Monday, Wednesday, and Friday with hemodialysis.   divalproex  500 MG DR tablet Commonly known as: DEPAKOTE  Take 2 tablets (1,000 mg total) by mouth every 12 (twelve) hours.   imiquimod  5 % cream Commonly known as: Aldara  Apply to affected area three times weekly for 8 weeks.   levETIRAcetam  500 MG tablet Commonly known as: KEPPRA  Take 1 tablet (500 mg total) by mouth every Monday, Wednesday, and Friday with hemodialysis. What changed:  when to take this additional instructions   levETIRAcetam  500 MG tablet Commonly known as: Keppra  Take 1 tablet (500 mg total) by mouth 2 (two) times daily. What changed:  when to take this additional instructions   losartan  50 MG tablet Commonly known as: COZAAR  Take 50 mg by mouth daily.   metoprolol  succinate 25 MG 24 hr tablet Commonly known as: TOPROL -XL Take 1 tablet (25 mg total) by mouth daily. Start taking on: January 13, 2024   multivitamin with minerals tablet Take 1 tablet by mouth daily.        Discharge Instructions: Please refer to Patient Instructions section of EMR for full details.  Patient was counseled important signs and symptoms that should prompt return to medical care, changes in medications, dietary instructions, activity restrictions, and follow up appointments.  Follow-Up Appointments: Follow-up with PCP in 1-2 weeks.  Larraine Palma, MD 01/12/2024, 4:15 PM PGY-1, [facility]

## 2024-01-12 NOTE — Progress Notes (Addendum)
 Physical Therapy Treatment Patient Details Name: [name] [name] MRN: [id number] DOB: [birth year] Today's Date: 01/12/2024   History of Present Illness [name] [name] is a 43 y.o. male admitted 01/06/24 with fever, dyspnea, desatting to the 70s per chart review in dialysis, cough, swelling BLE. Dx with acute hypoxic respiratory failure.  PMH includes ESRD on hemodialysis, AV fistula placement (Right, 12/24/2023), HTN, Lupus, Seizures (May 2025), Stroke (May 2025), and Wears glasses.    PT Comments  Pt seen for continued evaluation of mobility and d/c recommendations as pt declining SNF recommendation to return to group home. The pt continues to present with increased sway and instability, but did not have full LOB when moving around in the room or when leaning outside BOS to pick up items and get dressed. The pt did continue to ask for assist with dressing due to difficulty with fine motor in UE, but does demo improved strength in LE by ability to complete sit-stand without use of UE. The pt declined further ambulation due to reports of fatigue, but SpO2 95% on RA with movement in the room. Given pt's increased risk of falls and need for assistance with self-care, he could benefit from continued inpatient rehab < hours/day. However, pt continues to decline and therefore would benefit from max HHPT and increased supervision and assist as available at group home.     If plan is discharge home, recommend the following: A little help with walking and/or transfers;A little help with bathing/dressing/bathroom;Assist for transportation;Assistance with cooking/housework   Can travel by private vehicle     Yes  Equipment Recommendations  None recommended by PT    Recommendations for Other Services       Precautions / Restrictions Precautions Precautions: Fall Recall of Precautions/Restrictions: Intact Precaution/Restrictions Comments: monitor SpO2 & HR Restrictions Weight Bearing Restrictions Per  Provider Order: No     Mobility  Bed Mobility               General bed mobility comments: Pt sitting EOB upon arrival    Transfers Overall transfer level: Needs assistance Equipment used: None Transfers: Sit to/from Stand Sit to Stand: Supervision           General transfer comment: CGA to supevision, completed x8 in session (4 with UE and 4 without)    Ambulation/Gait Ambulation/Gait assistance: Contact guard assist Gait Distance (Feet): 10 Feet Assistive device: None Gait Pattern/deviations: Step-through pattern, Decreased stride length, Wide base of support, Decreased step length - right, Decreased step length - left, Trunk flexed, Knee flexed in stance - right, Knee flexed in stance - left Gait velocity: decreased Gait velocity interpretation: <1.31 ft/sec, indicative of household ambulator   General Gait Details: pt declined further ambulation than within room to prepare for d/c, increased sway and small steps with minimal clearance. no overt LOB but pt agrees more instability than baseline.       Balance Overall balance assessment: Needs assistance Sitting-balance support: Feet supported, No upper extremity supported Sitting balance-Leahy Scale: Good     Standing balance support: No upper extremity supported, During functional activity Standing balance-Leahy Scale: Fair Standing balance comment: CGA but increased sway with mobility, able to reach outside BOS and pick up items without LOB                            Communication Communication Communication: Impaired Factors Affecting Communication: Reduced clarity of speech  Cognition Arousal: Alert, Lethargic Behavior During Therapy:  WFL for tasks assessed/performed   PT - Cognitive impairments: Awareness, Problem solving, Safety/Judgement                       PT - Cognition Comments: pt with history of recent stroke (May 2025) with decreased awareness of deficits and visual  impairments; flat affect but responsive to instructions and able to voice needs Following commands: Impaired Following commands impaired: Follows multi-step commands with increased time    Cueing Cueing Techniques: Verbal cues  Exercises Other Exercises Other Exercises: sit <> stand x8 serial reps (4 with UE and 4 without)    General Comments        Pertinent Vitals/Pain Pain Assessment Pain Assessment: No/denies pain Pain Score: 0-No pain Faces Pain Scale: No hurt Pain Intervention(s): Monitored during session     PT Goals (current goals can now be found in the care plan section) Acute Rehab PT Goals Patient Stated Goal: to return to rehab PT Goal Formulation: With patient Time For Goal Achievement: 01/21/24 Potential to Achieve Goals: Good Progress towards PT goals: Progressing toward goals    Frequency    Min 2X/week       AM-PAC PT 6 Clicks Mobility   Outcome Measure  Help needed turning from your back to your side while in a flat bed without using bedrails?: None Help needed moving from lying on your back to sitting on the side of a flat bed without using bedrails?: None Help needed moving to and from a bed to a chair (including a wheelchair)?: A Little Help needed standing up from a chair using your arms (e.g., wheelchair or bedside chair)?: A Little Help needed to walk in hospital room?: A Little Help needed climbing 3-5 steps with a railing? : A Lot 6 Click Score: 19    End of Session Equipment Utilized During Treatment: Gait belt Activity Tolerance: Patient limited by fatigue;Patient tolerated treatment well Patient left: with call bell/phone within reach;in bed Nurse Communication: Other (comment) (SpO2 and HR) PT Visit Diagnosis: Other abnormalities of gait and mobility (R26.89);Unsteadiness on feet (R26.81);Muscle weakness (generalized) (M62.81);Difficulty in walking, not elsewhere classified (R26.2)     Time: 8686-8668 PT Time Calculation  (min) (ACUTE ONLY): 18 min  Charges:    $Therapeutic Activity: 8-22 mins PT General Charges $$ ACUTE PT VISIT: 1 Visit                     Izetta Call, PT, DPT   Acute Rehabilitation Department Office [phone] Secure Chat Communication Preferred   Izetta JULIANNA Call 01/12/2024, 1:47 PM

## 2024-01-13 ENCOUNTER — Other Ambulatory Visit (HOSPITAL_COMMUNITY): Payer: Self-pay

## 2024-01-13 ENCOUNTER — Encounter (HOSPITAL_COMMUNITY): Payer: Self-pay

## 2024-01-13 DIAGNOSIS — E8770 Fluid overload, unspecified: Secondary | ICD-10-CM

## 2024-01-13 DIAGNOSIS — J9601 Acute respiratory failure with hypoxia: Secondary | ICD-10-CM | POA: Diagnosis not present

## 2024-01-13 DIAGNOSIS — N186 End stage renal disease: Secondary | ICD-10-CM | POA: Diagnosis not present

## 2024-01-13 LAB — RENAL FUNCTION PANEL
Albumin: 3.1 g/dL — ABNORMAL LOW (ref 3.5–5.0)
Anion gap: 19 — ABNORMAL HIGH (ref 5–15)
BUN: 90 mg/dL — ABNORMAL HIGH (ref 6–20)
CO2: 21 mmol/L — ABNORMAL LOW (ref 22–32)
Calcium: 9.6 mg/dL (ref 8.9–10.3)
Chloride: 91 mmol/L — ABNORMAL LOW (ref 98–111)
Creatinine, Ser: 14.69 mg/dL — ABNORMAL HIGH (ref 0.61–1.24)
GFR, Estimated: 4 mL/min — ABNORMAL LOW (ref >60)
Glucose, Bld: 80 mg/dL (ref 70–99)
Phosphorus: 8.8 mg/dL — ABNORMAL HIGH (ref 2.5–4.6)
Potassium: 4.6 mmol/L (ref 3.5–5.1)
Sodium: 131 mmol/L — ABNORMAL LOW (ref 135–145)

## 2024-01-13 LAB — CULTURE, BLOOD (ROUTINE X 2)
Culture: NO GROWTH
Culture: NO GROWTH
Special Requests: ADEQUATE

## 2024-01-13 LAB — CBC WITH DIFFERENTIAL/PLATELET
Abs Immature Granulocytes: 0.02 K/uL (ref 0.00–0.07)
Basophils Absolute: 0 K/uL (ref 0.0–0.1)
Basophils Relative: 1 %
Eosinophils Absolute: 0.2 K/uL (ref 0.0–0.5)
Eosinophils Relative: 4 %
HCT: 33.1 % — ABNORMAL LOW (ref 39.0–52.0)
Hemoglobin: 10.9 g/dL — ABNORMAL LOW (ref 13.0–17.0)
Immature Granulocytes: 0 %
Lymphocytes Relative: 31 %
Lymphs Abs: 1.7 K/uL (ref 0.7–4.0)
MCH: 33.4 pg (ref 26.0–34.0)
MCHC: 32.9 g/dL (ref 30.0–36.0)
MCV: 101.5 fL — ABNORMAL HIGH (ref 80.0–100.0)
Monocytes Absolute: 0.6 K/uL (ref 0.1–1.0)
Monocytes Relative: 11 %
Neutro Abs: 2.8 K/uL (ref 1.7–7.7)
Neutrophils Relative %: 53 %
Platelets: 133 K/uL — ABNORMAL LOW (ref 150–400)
RBC: 3.26 MIL/uL — ABNORMAL LOW (ref 4.22–5.81)
RDW: 15.5 % (ref 11.5–15.5)
WBC: 5.4 K/uL (ref 4.0–10.5)
nRBC: 0 % (ref 0.0–0.2)

## 2024-01-13 MED ORDER — CALCITRIOL 0.5 MCG PO CAPS
ORAL_CAPSULE | ORAL | Status: AC
  Filled 2024-01-13: qty 5

## 2024-01-13 MED ORDER — HYDROCORTISONE (PERIANAL) 2.5 % EX CREA
TOPICAL_CREAM | Freq: Two times a day (BID) | CUTANEOUS | Status: DC
  Filled 2024-01-13: qty 28.35

## 2024-01-13 MED ORDER — METOPROLOL SUCCINATE ER 25 MG PO TB24
25.0000 mg | ORAL_TABLET | Freq: Every day | ORAL | 0 refills | Status: AC
  Filled 2024-01-13: qty 30, 30d supply, fill #0

## 2024-01-13 MED ORDER — ANTICOAGULANT SODIUM CITRATE 4% (200MG/5ML) IV SOLN: 5.0000 mL | Status: DC | PRN

## 2024-01-13 MED ORDER — HYDROCORTISONE (PERIANAL) 2.5 % EX CREA
1.0000 | TOPICAL_CREAM | Freq: Two times a day (BID) | CUTANEOUS | Status: DC
  Filled 2024-01-13: qty 28.35

## 2024-01-13 MED ORDER — DIPHENHYDRAMINE HCL 50 MG/ML IJ SOLN
INTRAMUSCULAR | Status: AC
Start: 2024-01-13 — End: 2024-01-13
  Filled 2024-01-13: qty 1

## 2024-01-13 MED ORDER — CINACALCET HCL 30 MG PO TABS
ORAL_TABLET | ORAL | Status: AC
Start: 2024-01-13 — End: 2024-01-13
  Filled 2024-01-13: qty 5

## 2024-01-13 MED ORDER — HEPARIN SODIUM (PORCINE) 1000 UNIT/ML DIALYSIS
1000.0000 [IU] | INTRAMUSCULAR | Status: DC | PRN
  Administered 2024-01-13: 1000 [IU]

## 2024-01-13 MED ORDER — IMIQUIMOD 5 % EX CREA
TOPICAL_CREAM | CUTANEOUS | 0 refills | Status: AC
  Filled 2024-01-13: qty 12, 30d supply, fill #0

## 2024-01-13 MED ORDER — CALCITRIOL 0.25 MCG PO CAPS
ORAL_CAPSULE | ORAL | Status: AC
  Filled 2024-01-13: qty 1

## 2024-01-13 MED ORDER — HEPARIN SODIUM (PORCINE) 1000 UNIT/ML IJ SOLN
INTRAMUSCULAR | Status: AC
  Filled 2024-01-13: qty 4

## 2024-01-13 MED ORDER — ALTEPLASE 2 MG IJ SOLR: 2.0000 mg | Freq: Once | INTRAMUSCULAR | Status: DC | PRN

## 2024-01-13 MED ORDER — ASPIRIN 81 MG PO CHEW
81.0000 mg | CHEWABLE_TABLET | Freq: Every day | ORAL | 0 refills | Status: AC
  Filled 2024-01-13: qty 30, 30d supply, fill #0

## 2024-01-13 MED ORDER — DIPHENHYDRAMINE HCL 25 MG PO CAPS
ORAL_CAPSULE | ORAL | Status: AC
  Filled 2024-01-13: qty 1

## 2024-01-13 NOTE — Assessment & Plan Note (Addendum)
 EKG 7/23 with QTc of 475, improved. - Avoid QT prolonging medications

## 2024-01-13 NOTE — Assessment & Plan Note (Signed)
 Antiphospholipid syndrome  History of CVA - Eliquis  5 mg twice daily, aspirin  81 mg daily (previously noncompliant on warfarin) Seizures - Home Keppra , home Depakote  (Keppra  500 mg twice daily and 500 mg MWF after HD and Depakote  1000 mg every 12 hours for seizure prophylaxis); per patient, has been taking Keppra  twice daily every day (dialysis days included), but will keep patient on previously prescribed regimen Hypertension - Home losartan  50 mg daily Thrombocytopenia - Chronic, suspect 2/2 antiepileptics and/or reactive infectious etiology Anemia - Suspect chronic in setting of ESRD on dialysis; stable throughout admission

## 2024-01-13 NOTE — Progress Notes (Signed)
 D/C order noted. Contacted [facility] on [address] to be advised of pt's d/c today and that pt should resume care on Wednesday.   Randine Mungo Dialysis Navigator [phone]

## 2024-01-13 NOTE — Discharge Planning (Signed)
 [facility] Patient Discharge Orders- [facility] CLINIC: [facility]  Patient's name: [name] [name] Admit/DC Dates: 01/06/2024 - anticipated dc 01/13/24  Discharge Diagnoses: Acute hypoxic resp failure/PNA/volume overload - completed ABXs  Mitral stenosis - ECHO from 7/25 showed improvement. 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  Completed By: Charmaine Piety, NP   Reviewed by: MD:______ RN_______

## 2024-01-13 NOTE — Assessment & Plan Note (Addendum)
 Albumin  of 3.0 on 7/26. - Feeding supplement twice daily between meals (patient has been refusing)

## 2024-01-13 NOTE — Assessment & Plan Note (Addendum)
 Remains improved, on room air. Blood cultures without growth, complete. - Nephro following for volume management with dialysis - Pain: Tylenol  650 mg q6h PRN

## 2024-01-13 NOTE — Progress Notes (Signed)
 OT Cancellation Note  Patient Details Name: [name] [name] MRN: [id number] DOB: 26-Oct-1980   Cancelled Treatment:    Reason Eval/Treat Not Completed: Other (comment) (pt mother just arriving and pt kindly requests OT to return at a later date.)  [name], OTD, OTR/L [facility] Acute Rehabilitation Office: [phone]   [name] [name] 01/13/2024, 4:00 PM

## 2024-01-13 NOTE — TOC Progression Note (Signed)
 Transition of Care [facility]) - Progression Note    Patient Details  Name: [name] [name] MRN: [id number] Date of Birth: [birth year]  Transition [facility] Care [facility] CM/SW Contact  Linlee Cromie A [place], LCSW Phone Number: 01/13/2024, 11:06 AM  Clinical Narrative:     CSW notified by nursing that pt is stable for discharge. CSW started [employer] authorization with insurance. Status pending. Reference ID: [id number].   Kia at [facility] states that she can admit pt back to [facility] SN[facility] today if pt's authorization gets approved.   TOC will continue to follow.    Expected Discharge Plan: Skilled Nursing Facility Barriers to Discharge: Continued Medical Work up, English as a second language teacher               Expected Discharge Plan and Services In-house Referral: Clinical Social Work     Living arrangements for the past 2 months: Skilled Nursing Facility, Single Family Home Expected Discharge Date: 01/12/24                                     Social Drivers of Health (SDOH) Interventions SDOH Screenings   Food Insecurity: No Food Insecurity (01/06/2024)  Housing: Low Risk  (01/10/2024)  Recent Concern: Housing - High Risk (10/21/2023)  Transportation Needs: No Transportation Needs (01/06/2024)  Utilities: Not At Risk (01/06/2024)  Alcohol Screen: Low Risk  (03/26/2023)  Depression (PHQ2-9): Low Risk  (03/26/2023)  Financial Resource Strain: Low Risk  (03/26/2023)  Physical Activity: Inactive (03/26/2023)  Social Connections: Patient Declined (01/06/2024)  Recent Concern: Social Connections - Moderately Isolated (10/21/2023)  Stress: No Stress Concern Present (03/26/2023)  Tobacco Use: High Risk (01/06/2024)  Health Literacy: Adequate Health Literacy (03/26/2023)    Readmission Risk Interventions    01/08/2024   12:19 PM 03/28/2023    2:57 PM 01/25/2023    3:40 PM  Readmission Risk Prevention Plan  Post Dischage Appt     Medication Screening     Transportation Screening Complete  Complete   Medication Review (RN Care Manager) Complete Referral to Pharmacy   PCP or Specialist appointment within 3-5 days of discharge Complete Complete   HRI or Home Care Consult Complete Complete   SW Recovery Care/Counseling Consult Complete Complete   Palliative Care Screening Not Applicable Not Applicable   Skilled Nursing Facility Complete Not Applicable      Information is confidential and restricted. Go to Review Flowsheets to unlock data.

## 2024-01-13 NOTE — Discharge Summary (Addendum)
 Family Medicine Teaching [facility] Discharge Summary  Patient name: [name] [name] Medical record number: [id number] Date of birth: [birth year] Age: 43 y.o. Gender: male Date of Admission: 01/06/2024  Date of Discharge: 01/13/2024 Admitting Physician: [name] Provencal, MD  Primary Care Provider: Theotis [name] ORN, NP Consultants: Nephrology, cardiology  Indication for Hospitalization: acute hypoxic respiratory failure  Discharge Diagnoses/Problem List:  Principal Problem for Admission: Acute hypoxic respiratory failure Other Problems addressed during stay:  Active Problems:   Thrombocytopenia (HCC)   ESRD (end stage renal disease) (HCC)   Anemia   Volume overload   Pneumonia due to infectious organism   Prolonged Q-T interval on ECG   Aplastic anemia (HCC)   Mitral stenosis   Hypoalbuminemia   [name] [name] is a 43 y.o. year old with a history of ESRD on dialysis, HTN, hx CVA, hx seizure, lupus, antiphospholipid syndrome who presented with fever and dyspnea and was admitted to the [name] Teaching Service for hypoxic respiratory failure and CAP.  Acute Hypoxic Respiratory Failure Community Acquired Pneumonia Volume overload Presented from HD, where he'd received vancomycin  and ceftazidime once, with concern for CAP given infectious symptoms and fever. Also with concern for volume overload with large volumes removed via dialysis throughout stay. On admission, he had O2 requirement with 4L [place] (no home O2 requirement); transitioned to room air on 7/25. Patient started on IV Ceftriaxone , doxycycline  to completed 5 day course (7/22-7/26). Given continued hypoxia with O2 requirements, CT PE done 7/22 which showed no PE. Suspicion for possible lupus flare contributing to symptoms given echo 7/22 with right atrial pressure of 15 mmHg, so lupus labs done which did not indicate flare. Concern for OSA contributing hypoxemia. Patient improved with dialysis and  antibiotics.  Mitral Stenosis Echo done 7/22 showed moderate to severe mitral stenosis. Cardiology consulted, and recommended starting Toprol  25 mg daily and repeating echo prior to discharge when HR is down and he is less fluid overloaded. Repeat echo done 7/25 showed improvement to mild mitral stenosis. Cardiology recommended continuing Toprol  and continued routine outpatient follow up.  Anemia Hgb dropped from 10.5 to 8.5 on 7/22; remained stable following.  ESRD Patient on outpatient MWF dialysis schedule. Received extra dialysis on Tues 7/22 given fluid overloaded; kept on TTS schedule while inpatient. Returned to MWF schedule on 7/28.  Perianal condyloma Nonpainful, found this admission. Patient started on topical Imiquimod .   Other chronic conditions were medically managed with home medications and formulary alternatives as necessary (Hx CVA, seizures, HTN, thrombocytopenia, antiphospholipid syndrome).  PCP Follow-up Recommendations: CT chest with contrast in 3-6 months to evaluate progressive mediastinal lymphadenopathy Outpatient rheumatology follow-up given CT changes above with known lupus Refer to pulmonology Place referral for sleep study (suspect OSA) Treatment of perianal condyloma: cryotherapy vs chemical; discharged on Imiquimod    Results/Tests Pending at Time of Discharge:  None  Disposition: SNF [name] [name], where he had been staying prior to admission)  Discharge Condition: Stable  Discharge Exam:  Vitals:   01/13/24 1327 01/13/24 1335  BP: (!) 72/46 (!) 88/59  Pulse: 67 66  Resp:  17  Temp:  98.1 F (36.7 C)  SpO2:  95%   Physical Exam (from earlier 7/28 progress note): General: Patient is lying down in bed, sleepy, no acute distress. Cardiovascular: Regular rate and rhythm, no murmurs/rubs/gallops. Respiratory: Normal work of breathing on room air. Clear to auscultation bilaterally; no wheezes, crackles. Abdomen: Bowel sounds present and  normoactive bilaterally. Soft, nondistended, nontender. Extremities: Skin warm,  dry. Bilateral lower extremity edema present, unchanged from prior (1-2+ pitting)  Significant Procedures: None  Significant Labs and Imaging:  No results for input(s): WBC, HGB, HCT, PLT in the last 48 hours. Recent Labs  Lab 01/11/24 0244  NA 132*  K 4.7  CL 95*  CO2 23  GLUCOSE 78  BUN 80*  CREATININE 13.02*  CALCIUM  8.7*  PHOS 8.4*  ALBUMIN  3.0*   CRP: 98.11 BNP: 846.1  dsDNA: 27 C3: 138  C4: 19  CT PE 7/23: 1. No evidence of pulmonary embolism. 2. Chronic mediastinal lymphadenopathy, mildly progressive. Reactive nodes are favored, although low grade lymphoproliferative disorder is technically possible. Consider follow-up CT chest with contrast in 3-6 months.   Discharge Medications:  Allergies as of 01/13/2024       Reactions   Cetirizine & Related Swelling   Hibiclens  [chlorhexidine  Gluconate] Itching        Medication List     STOP taking these medications    oxyCODONE -acetaminophen  5-325 MG tablet Commonly known as: Percocet       TAKE these medications    acetaminophen  325 MG tablet Commonly known as: TYLENOL  Take 2 tablets (650 mg total) by mouth every 6 (six) hours as needed for mild pain.   apixaban  5 MG Tabs tablet Commonly known as: Eliquis  Take 1 tablet (5 mg total) by mouth 2 (two) times daily.   aspirin  81 MG chewable tablet Chew 1 tablet (81 mg total) by mouth daily.   atorvastatin  40 MG tablet Commonly known as: LIPITOR Take 1 tablet (40 mg total) by mouth daily.   Auryxia  1 GM 210 MG(Fe) tablet Generic drug: ferric citrate  Take 840 mg by mouth 3 (three) times daily with meals.   calcitRIOL  0.5 MCG capsule Commonly known as: ROCALTROL  Take 5 capsules (2.5 mcg total) by mouth every Monday, Wednesday, and Friday with hemodialysis.   cinacalcet  30 MG tablet Commonly known as: SENSIPAR  Take 5 tablets (150 mg total) by mouth every  Monday, Wednesday, and Friday with hemodialysis.   divalproex  500 MG DR tablet Commonly known as: DEPAKOTE  Take 2 tablets (1,000 mg total) by mouth every 12 (twelve) hours.   imiquimod  5 % cream Commonly known as: Aldara  Apply to affected area three times weekly for 8 weeks.   levETIRAcetam  500 MG tablet Commonly known as: KEPPRA  Take 1 tablet (500 mg total) by mouth every Monday, Wednesday, and Friday with hemodialysis. What changed:  when to take this additional instructions   levETIRAcetam  500 MG tablet Commonly known as: Keppra  Take 1 tablet (500 mg total) by mouth 2 (two) times daily. What changed:  when to take this additional instructions   losartan  50 MG tablet Commonly known as: COZAAR  Take 50 mg by mouth daily.   metoprolol  succinate 25 MG 24 hr tablet Commonly known as: TOPROL -XL Take 1 tablet (25 mg total) by mouth daily.   multivitamin with minerals tablet Take 1 tablet by mouth daily.         Discharge Instructions: Please refer to Patient Instructions section of EMR for full details.  Patient was counseled important signs and symptoms that should prompt return to medical care, changes in medications, dietary instructions, activity restrictions, and follow up appointments.   Follow-Up Appointments: Follow-up with PCP in 1-2 weeks.  Larraine Palma, MD 01/13/24 2:11 PM PGY-1, [facility]   FMTS Upper-Level Resident Attestation I have independently interviewed and examined the patient. I have discussed the above with Dr. Larraine and agree with the documented  plan. My edits for correction/addition/clarification are included above. Please see any attending notes.  Kaelyn Nauta Toma, MD PGY-2, [facility] 01/13/2024 2:13 PM FPTS Service pager: [phone] (text pages welcome through [employer])

## 2024-01-13 NOTE — Progress Notes (Signed)
   01/13/24 1212  Vitals  Temp 98.7 F (37.1 C)  Resp 16  BP 135/73  SpO2 96 %  O2 Device Nasal Cannula  Weight (S)  122.1 kg (Bed Scale Weight)  Type of Weight Post-Dialysis  Oxygen Therapy  O2 Flow Rate (L/min) 2 L/min  Pulse Oximetry Type Continuous  Post Treatment  Dialyzer Clearance Clear  Hemodialysis Intake (mL) 0 mL  Liters Processed 67.3  Fluid Removed (mL) 3100 mL  Tolerated HD Treatment (S)  No (Comment) (Pt SBP decrease off and often but pt. remains asyptomatic.)  Post-Hemodialysis Comments Was not able to obtained UF of 5L, D/T decrease SBP. Report call to 2W to bedside Nurse.   Received patient in bed to unit.  Alert and oriented.  Informed consent signed and in chart.   TX duration: 3  Patient tolerated well.  Transported back to the room  Alert, without acute distress.  Hand-off given to patient's nurse.   Access used: Yes Access issues: No  Total UF removed: 3100 Medication(s) given: See MAR Post HD VS: See Above Grid Post HD weight: 122.1 kg   [facility]ebed[facility] DELE[facility]

## 2024-01-13 NOTE — Progress Notes (Signed)
 [facility] Progress Note   Subjective:    Patient feels well with no complaints.  Planning to discharge today  Objective Vitals:   01/13/24 0816 01/13/24 0854 01/13/24 0857 01/13/24 0930  BP: 130/89 126/85 122/88 (!) 139/94  Pulse: 62 65 62   Resp: 18 16 15 19   Temp:  98.7 F (37.1 C)    TempSrc:      SpO2: 100% 96% 97% 97%  Weight:  (S) 125.2 kg    Height:       Physical Exam General: Awake, alert, NAD Heart: Normal rate, no rub Lungs: Bilateral chest rise with no increased work of breathing Abdomen: Soft and non-tender Extremities: Edema in the upper and lower extremities present, warm and well-perfused Dialysis Access: [facility]   Filed Weights   01/11/24 1823 01/11/24 2130 01/13/24 0854  Weight: 129.1 kg 124.8 kg (S) 125.2 kg    Intake/Output Summary (Last 24 hours) at 01/13/2024 1004 Last data filed at 01/12/2024 2012 Gross per 24 hour  Intake 560 ml  Output --  Net 560 ml    Additional Objective Labs: Basic Metabolic Panel: Recent Labs  Lab 01/09/24 0255 01/10/24 0236 01/11/24 0244 01/13/24 0431  NA 135 133* 132* 131*  K 4.8 4.4 4.7 4.6  CL 97* 92* 95* 91*  CO2 25 24 23  21*  GLUCOSE 116* 77 78 80  BUN 62* 54* 80* 90*  CREATININE 12.14* 10.64* 13.02* 14.69*  CALCIUM  8.4* 8.9 8.7* 9.6  PHOS 8.1*  --  8.4* 8.8*   Liver Function Tests: Recent Labs  Lab 01/06/24 1553 01/09/24 0255 01/11/24 0244 01/13/24 0431  AST 19  --   --   --   ALT 8  --   --   --   ALKPHOS 66  --   --   --   BILITOT 0.7  --   --   --   PROT 7.3  --   --   --   ALBUMIN  3.2* 2.7* 3.0* 3.1*   No results for input(s): LIPASE, AMYLASE in the last 168 hours. CBC: Recent Labs  Lab 01/06/24 1553 01/07/24 0331 01/07/24 2039 01/08/24 0300 01/09/24 0255 01/10/24 0236 01/13/24 0649  WBC 6.9   < > 5.8 5.3 5.0 5.6 5.4  NEUTROABS 4.5  --   --   --   --   --  2.8  HGB 10.5*   < > 9.2* 8.7* 8.9* 10.0* 10.9*  HCT 32.7*   < > 28.7* 26.8* 27.7* 30.8* 33.1*   MCV 104.8*   < > 102.9* 103.1* 103.0* 102.0* 101.5*  PLT 80*   < > 81* 80* 98* 107* 133*   < > = values in this interval not displayed.   Blood Culture    Component Value Date/Time   SDES BLOOD LEFT HAND 01/06/2024 2123   SPECREQUEST  01/06/2024 2123    BOTTLES DRAWN AEROBIC AND ANAEROBIC Blood Culture adequate volume   CULT  01/06/2024 2123    NO GROWTH 7 DAYS Performed at [facility], [address], [place], [place] [address]    REPTSTATUS 01/13/2024 FINAL 01/06/2024 2123    Cardiac Enzymes: No results for input(s): CKTOTAL, CKMB, CKMBINDEX, TROPONINI in the last 168 hours. CBG: No results for input(s): GLUCAP in the last 168 hours. Iron  Studies: No results for input(s): IRON , TIBC, TRANSFERRIN, FERRITIN in the last 72 hours. Lab Results  Component Value Date   INR 2.5 (H) 04/20/2023   INR 2.6 (H) 04/19/2023  INR 2.7 (H) 04/18/2023   PROTIME 28.8 (H) 11/04/2013   PROTIME 21.6 (H) 10/14/2013   PROTIME 18.0 (H) 09/23/2013   Studies/Results: No results found.   Medications:    apixaban   5 mg Oral BID   aspirin   81 mg Oral Daily   atorvastatin   40 mg Oral Daily   calcitRIOL   2.5 mcg Oral Q M,W,F-HD   Chlorhexidine  Gluconate Cloth  6 each Topical Daily   cinacalcet   150 mg Oral Q M,W,F-HD   divalproex   1,000 mg Oral Q12H   feeding supplement (NEPRO CARB STEADY)  237 mL Oral BID BM   ferric citrate   840 mg Oral TID WC   hydrocortisone   1 Application Topical BID   levETIRAcetam   500 mg Oral BID   levETIRAcetam   500 mg Oral Q M,W,F   losartan   50 mg Oral Daily   metoprolol  succinate  25 mg Oral Daily   sodium chloride  flush  10-40 mL Intracatheter Q12H    Dialysis Orders: MWF [facility] 4h   B400   108kg   2K bath  TDC   Heparin  none Prog vol^^, was 12kg over 3 wks ago and 20kg over this past week Goes to all his sessions and stays on machine   Home bp meds: Losartan  50 mg daily  Assessment/Plan: PNA: w/ fever, SOB, O2 requirement, per  Primary.  Overall improved Pulm edema: Improved with ultrafiltration on dialysis.  Continue with ultrafiltration as tolerated Mitral stenosis - seen on ECHO 7/22, Cardiology consulted.  Appears mild ESRD: on HD MWF.  Continue per schedule HTN: takes ARB at home, bp's wnl here.  Volume: Overload improved.  Chronic volume retention also an issue Anemia of esrd: Hb 8.5- 11 here, follow. 2nd HPTH: Corr Ca 9.5 but phos is high. Continue Auryxia , Calcitriol , and Sensipar .   [facility] 01/13/2024,10:04 AM  LOS: 7 days

## 2024-01-13 NOTE — TOC Transition Note (Signed)
 Transition of Care [facility]) - Discharge Note   Patient Details  Name: [name] [name] MRN: [id number] Date of Birth: [birth year]  Transition of Care [facility]) CM/SW Contact:  Kery Batzel A [place], LCSW Phone Number: 01/13/2024, 4:40 PM   Clinical Narrative:     Patient will DC to: [facility]  Anticipated DC date: 01/13/24  Family notified: Pt declined collateral contact  Transport by: [facility]      Per MD patient ready for DC to [facility] center . RN, patient, patient's family, and facility notified of DC. Discharge Summary and FL2 sent to facility. RN to call report prior to discharge [phone]). DC packet on chart. Ambulance transport requested for patient.     CSW will sign off for now as social work intervention is no longer needed. Please consult us  again if new needs arise.   Final next level of care: Skilled Nursing Facility Barriers to Discharge: Barriers Resolved   Patient Goals and CMS Choice            Discharge Placement              Patient chooses bed at: [facility] Patient to be transferred to facility by: [facility] Name of family member notified: Pt declined collateral contact Patient and family notified of of transfer: 01/13/24  Discharge Plan and Services Additional resources added to the After Visit Summary for   In-house Referral: Clinical Social Work                                   Social Drivers of Health (SDOH) Interventions SDOH Screenings   Food Insecurity: No Food Insecurity (01/06/2024)  Housing: Low Risk  (01/10/2024)  Recent Concern: Housing - High Risk (10/21/2023)  Transportation Needs: No Transportation Needs (01/06/2024)  Utilities: Not At Risk (01/06/2024)  Alcohol Screen: Low Risk  (03/26/2023)  Depression (PHQ2-9): Low Risk  (03/26/2023)  Financial Resource Strain: Low Risk  (03/26/2023)  Physical Activity: Inactive (03/26/2023)  Social Connections: Patient Declined (01/06/2024)  Recent Concern:  Social Connections - Moderately Isolated (10/21/2023)  Stress: No Stress Concern Present (03/26/2023)  Tobacco Use: High Risk (01/06/2024)  Health Literacy: Adequate Health Literacy (03/26/2023)     Readmission Risk Interventions    01/08/2024   12:19 PM 03/28/2023    2:57 PM 01/25/2023    3:40 PM  Readmission Risk Prevention Plan  Post Dischage Appt     Medication Screening     Transportation Screening Complete Complete   Medication Review (RN Care Manager) Complete Referral to Pharmacy   PCP or Specialist appointment within 3-5 days of discharge Complete Complete   HRI or Home Care Consult Complete Complete   SW Recovery Care/Counseling Consult Complete Complete   Palliative Care Screening Not Applicable Not Applicable   Skilled Nursing Facility Complete Not Applicable      Information is confidential and restricted. Go to Review Flowsheets to unlock data.

## 2024-01-13 NOTE — Assessment & Plan Note (Addendum)
 Outpatient dialysis schedule MWF; returned to this schedule today with dialysis. - Continue home calcitriol , Auryxia , Sensipar  - Appreciate nephrology recommendations

## 2024-01-13 NOTE — Assessment & Plan Note (Addendum)
 Mod-severe mitral stenosis on echo 7/22. Repeat echo 7/25 with mild mitral stenosis; suspicion that prior findings in setting of hypervolemia and elevated HR. - Toprol  25 mg daily for HR to continue per cards

## 2024-01-13 NOTE — Progress Notes (Signed)
 Daily Progress Note Intern Pager: [phone]  Patient name: [name] [name] Medical record number: [id number] Date of birth: 07/07/1980 Age: 43 y.o. Gender: male  Primary Care Provider: Theotis Haze ORN, NP Consultants: Nephrology, cardiology (signed-off)  Code Status: FULL  Pt Overview and Major Events to Date:  7/21: Admitted, nephrology consulted for dialysis 7/23: Cardiology consulted for echo findings  7/25: Repeat echo improved, cards signed off 7/27: Medically stable for discharge, awaiting insurance approval to return to SNF  Medical Decision Making:  [name] [name] is a 43 y.o. male admitted for fever and dyspnea 2/2 CAP versus volume overload. Also suspect undiagnosed OSA factoring into hypoxemia as well, to investigate outpatient.   Patient now medically stable for discharge, awaiting approval to return to [facility], where he had been staying prior to admission.  Pertinent PMH/PSH includes ESRD on dialysis, HTN, history of CVA, history of seizure, lupus, antiphospholipid syndrome.  Assessment & Plan Pneumonia due to infectious organism (Resolved: 01/12/2024) Volume overload Remains improved, on room air. Blood cultures without growth, complete. - Nephro following for volume management with dialysis - Pain: Tylenol  650 mg q6h PRN Mitral stenosis Mod-severe mitral stenosis on echo 7/22. Repeat echo 7/25 with mild mitral stenosis; suspicion that prior findings in setting of hypervolemia and elevated HR. - Toprol  25 mg daily for HR to continue per cards ESRD (end stage renal disease) [facility]) Outpatient dialysis schedule MWF; returned to this schedule today with dialysis. - Continue home calcitriol , Auryxia , Sensipar  - Appreciate nephrology recommendations Prolonged Q-T interval on ECG EKG 7/23 with QTc of 475, improved. - Avoid QT prolonging medications Hypoalbuminemia Albumin  of 3.0 on 7/26. - Feeding supplement twice daily between meals (patient has been  refusing) Chronic health problem Antiphospholipid syndrome  History of CVA - Eliquis  5 mg twice daily, aspirin  81 mg daily (previously noncompliant on warfarin) Seizures - Home Keppra , home Depakote  (Keppra  500 mg twice daily and 500 mg MWF after HD and Depakote  1000 mg every 12 hours for seizure prophylaxis); per patient, has been taking Keppra  twice daily every day (dialysis days included), but will keep patient on previously prescribed regimen Hypertension - Home losartan  50 mg daily Thrombocytopenia - Chronic, suspect 2/2 antiepileptics and/or reactive infectious etiology Anemia - Suspect chronic in setting of ESRD on dialysis; stable throughout admission   FEN/GI: Renal with fluid restriction of 1200 mL PPx: Eliquis  5 mg twice daily Dispo: SNF (was staying at [facility] prior to admission, will return) today pending insurance approval  Subjective:  Patient is doing well today, has no concerns this morning.  He is fairly sleepy this morning.  Wondering about when he will go home.  Objective: Temp:  [97.5 F (36.4 C)-98.1 F (36.7 C)] 98 F (36.7 C) (07/28 0318) Pulse Rate:  [65-77] 65 (07/28 0318) Resp:  [19-20] 20 (07/28 0318) BP: (101-131)/(70-88) 108/70 (07/28 0318) SpO2:  [93 %-98 %] 93 % (07/28 0318) Physical Exam: General: Patient is lying down in bed, sleepy, no acute distress. Cardiovascular: Regular rate and rhythm, no murmurs/rubs/gallops. Respiratory: Normal work of breathing on room air. Clear to auscultation bilaterally; no wheezes, crackles. Abdomen: Bowel sounds present and normoactive bilaterally. Soft, nondistended, nontender. Extremities: Skin warm, dry. Bilateral lower extremity edema present, unchanged from prior (1-2+ pitting)  Laboratory: Most recent CBC Lab Results  Component Value Date   WBC 5.6 01/10/2024   HGB 10.0 (L) 01/10/2024   HCT 30.8 (L) 01/10/2024   MCV 102.0 (H) 01/10/2024   PLT  107 (L) 01/10/2024   Most recent BMP     Latest Ref Rng & Units 01/13/2024    4:31 AM  BMP  Glucose 70 - 99 mg/dL 80   BUN 6 - 20 mg/dL 90   Creatinine 9.38 - 1.24 mg/dL 85.30   Sodium 864 - 854 mmol/L 131   Potassium 3.5 - 5.1 mmol/L 4.6   Chloride 98 - 111 mmol/L 91   CO2 22 - 32 mmol/L 21   Calcium  8.9 - 10.3 mg/dL 9.6     Larraine Palma, MD 01/13/2024, 7:02 AM  PGY-1, [facility] FPTS Intern pager: [phone], text pages welcome Secure chat group [facility]

## 2024-01-13 NOTE — Progress Notes (Signed)
 OT Cancellation Note  Patient Details Name: [name] [name] MRN: [id number] DOB: 09-09-1980   Cancelled Treatment:    Reason Eval/Treat Not Completed: Patient at procedure or test/ unavailable.  Off the unit for dialysis, continue efforts.    Tilda Samudio D Danen Lapaglia 01/13/2024, 8:51 AM 01/13/2024  RP, OTR/L  Acute Rehabilitation Services  Office:  [phone]

## 2024-01-17 ENCOUNTER — Other Ambulatory Visit: Payer: Self-pay | Admitting: Vascular Surgery

## 2024-01-17 DIAGNOSIS — N186 End stage renal disease: Secondary | ICD-10-CM

## 2024-02-05 ENCOUNTER — Encounter (HOSPITAL_COMMUNITY): Payer: Self-pay

## 2024-02-11 NOTE — H&P (View-Only) (Signed)
    Postoperative Access Visit   History of Present Illness   [name] [name] is a 43 y.o. year old male who presents for postoperative follow-up for:  Right brachial artery to basilic vein AV fistula creation 12/24/23 by Dr. Sheree.  The patient's wounds are well healed.  The patient notes no steal symptoms.  The patient is able to complete their activities of daily living.   He currently Dialyzes on MWF via [facility]  Physical Examination   Vitals:   02/12/24 1348  Temp: 98.2 F (36.8 C)  TempSrc: Temporal  Weight: 269 lb (122 kg)   Body mass index is 35.49 kg/m.  right arm Incision is well healed, 2+ radial pulse, hand grip is 5/5, sensation in digits is intact, palpable thrill, bruit can  be auscultated     Non invasive vascular lab:  Findings:  +--------------------+----------+-----------------+--------+  AVF                PSV (cm/s)Flow Vol (mL/min)Comments  +--------------------+----------+-----------------+--------+  Native artery inflow   160          1128                 +--------------------+----------+-----------------+--------+  AVF Anastomosis        750                               +--------------------+----------+-----------------+--------+   +------------+----------+-------------+----------+--------+  OUTFLOW VEINPSV (cm/s)Diameter (cm)Depth (cm)Describe  +------------+----------+-------------+----------+--------+  Mid UA          99        0.81        1.91             +------------+----------+-------------+----------+--------+  Dist UA        161        0.90        1.08             +------------+----------+-------------+----------+--------+    Summary:  Patent arteriovenous fistula.   Medical Decision Making   [name] [name] is a 43 y.o. year old male who presents s/p Right brachial artery to basilic vein AV fistula creation 12/24/23 by Dr. Sheree. Incision is well healed. Patent is without signs or symptoms of steal  syndrome Duplex today shows that the fistula is patent with excellent volume flow. The fistula has matured well but is deep in the right upper arm Will arrange right 2nd stage basilic vein transposition with Dr. Sheree in the near future. Patient dialyzes on MWF so we will try to arrange this on a non dialysis day He is on Eliquis  which will need to be held for surgery  Teretha Damme, PA-C [facility] [place] Office: [phone]  Clinic MD: Sheree

## 2024-02-11 NOTE — Progress Notes (Unsigned)
    Postoperative Access Visit   History of Present Illness   [name] [name] is a 43 y.o. year old male who presents for postoperative follow-up for:  Right brachial artery to basilic vein AV fistula creation 12/24/23 by Dr. Sheree.  The patient's wounds are well healed.  The patient notes no steal symptoms.  The patient is able to complete their activities of daily living.   He currently Dialyzes on MWF via [facility]  Physical Examination   Vitals:   02/12/24 1348  Temp: 98.2 F (36.8 C)  TempSrc: Temporal  Weight: 269 lb (122 kg)   Body mass index is 35.49 kg/m.  right arm Incision is well healed, 2+ radial pulse, hand grip is 5/5, sensation in digits is intact, palpable thrill, bruit can  be auscultated     Non invasive vascular lab:  Findings:  +--------------------+----------+-----------------+--------+  AVF                PSV (cm/s)Flow Vol (mL/min)Comments  +--------------------+----------+-----------------+--------+  Native artery inflow   160          1128                 +--------------------+----------+-----------------+--------+  AVF Anastomosis        750                               +--------------------+----------+-----------------+--------+   +------------+----------+-------------+----------+--------+  OUTFLOW VEINPSV (cm/s)Diameter (cm)Depth (cm)Describe  +------------+----------+-------------+----------+--------+  Mid UA          99        0.81        1.91             +------------+----------+-------------+----------+--------+  Dist UA        161        0.90        1.08             +------------+----------+-------------+----------+--------+    Summary:  Patent arteriovenous fistula.   Medical Decision Making   [name] [name] is a 43 y.o. year old male who presents s/p Right brachial artery to basilic vein AV fistula creation 12/24/23 by Dr. Sheree. Incision is well healed. Patent is without signs or symptoms of steal  syndrome Duplex today shows that the fistula is patent with excellent volume flow. The fistula has matured well but is deep in the right upper arm Will arrange right 2nd stage basilic vein transposition with Dr. Sheree in the near future. Patient dialyzes on MWF so we will try to arrange this on a non dialysis day He is on Eliquis  which will need to be held for surgery  Teretha Damme, PA-C [facility] [place] Office: [phone]  Clinic MD: Sheree

## 2024-02-12 ENCOUNTER — Ambulatory Visit: Attending: Vascular Surgery | Admitting: Physician Assistant

## 2024-02-12 ENCOUNTER — Encounter: Payer: Self-pay | Admitting: Physician Assistant

## 2024-02-12 ENCOUNTER — Ambulatory Visit (HOSPITAL_COMMUNITY)
Admission: RE | Admit: 2024-02-12 | Discharge: 2024-02-12 | Disposition: A | Discharge status: Home / Self Care | Source: Ambulatory Visit | Attending: Vascular Surgery | Admitting: Vascular Surgery

## 2024-02-12 VITALS — Temp 98.2°F | Wt 269.0 lb

## 2024-02-12 DIAGNOSIS — N186 End stage renal disease: Secondary | ICD-10-CM | POA: Insufficient documentation

## 2024-02-13 ENCOUNTER — Telehealth: Payer: Self-pay

## 2024-02-13 NOTE — Telephone Encounter (Signed)
 Attempted to call for surgery scheduling. LVM with [facility].  [phone]

## 2024-02-14 ENCOUNTER — Other Ambulatory Visit: Payer: Self-pay

## 2024-02-14 ENCOUNTER — Encounter (HOSPITAL_COMMUNITY): Payer: Self-pay

## 2024-02-14 DIAGNOSIS — N186 End stage renal disease: Secondary | ICD-10-CM

## 2024-02-21 ENCOUNTER — Encounter (HOSPITAL_COMMUNITY): Payer: Self-pay | Admitting: Vascular Surgery

## 2024-02-21 ENCOUNTER — Other Ambulatory Visit: Payer: Self-pay

## 2024-02-21 NOTE — Progress Notes (Signed)
 SDW CALL  Patient is a resident at [facility], [phone].  Reviewed instructions and history with nurse, Tanganika.  Instructions were also faxed to [phone].  Per Seena, transportation was already set up for Tuesday.     PCP - Haze Servant, NP Cardiologist -   Dialysis - MWF  PPM/ICD - denies   Chest x-ray - 01/06/24 EKG - 01/08/24 Stress Test -  ECHO - 01/10/24 Cardiac Cath -   Sleep Study - denies  No DM  Last dose of GLP1 agonist-  n/a GLP1 instructions:  n/a  Blood Thinner Instructions: per Dr. Sheree, last dose of Eliquis  should be 9/5 - RN at [facility] is aware Aspirin  Instructions: per Dr. Sheree, patient can continue Aspirin   ERAS Protcol - NPO PRE-SURGERY Ensure or G2-  n/a  COVID TEST-  n/a   Anesthesia review:  yes - HTN, stroke, Eliquis , history of seizures (last seizure was 10/21/23), ESRD  Patient's nurse denies that the patient has shortness of breath, fever, cough and chest pain over the phone call

## 2024-02-21 NOTE — Progress Notes (Signed)
 Surgical Instructions   Your procedure is scheduled on Tuesday, September 9th, 2025. Report to [facility] Main Entrance A at 5:30 A.M., then check in with the Admitting office. Any questions or running late day of surgery: call [phone]  Questions prior to your surgery date: call [phone], Monday-Friday, 8am-4pm. If you experience any cold or flu symptoms such as cough, fever, chills, shortness of breath, etc. between now and your scheduled surgery, please notify us  at the above number.     Remember:  Do not eat or drink after midnight the night before your surgery    Take these medicines the morning of surgery with A SIP OF WATER : Aspirin  Atorvastatin  (Lipitor) Divalproex  (Depakote ) Levetiracetam  (Keppra ) Metoprolol  Succinate (Toprol -XL)    May take these medicines IF NEEDED: Acetaminophen  (Tylenol )   Per Dr. Claretta instructions, Eliquis  should be held for 3 days prior to surgery.  The last dose should be on Friday, September 5th.     One week prior to surgery, STOP taking any Aleve, Naproxen, Ibuprofen, Motrin, Advil, Goody's, BC's, all herbal medications, fish oil, and non-prescription vitamins.                     Do NOT Smoke (Tobacco/Vaping) for 24 hours prior to your procedure.  If you use a CPAP at night, you may bring your mask/headgear for your overnight stay.   You will be asked to remove any contacts, glasses, piercing's, hearing aid's, dentures/partials prior to surgery. Please bring cases for these items if needed.    Patients discharged the day of surgery will not be allowed to drive home, and someone needs to stay with them for 24 hours.  SURGICAL WAITING ROOM VISITATION Patients may have no more than 2 support people in the waiting area - these visitors may rotate.   Pre-op  nurse will coordinate an appropriate time for 1 ADULT support person, who may not rotate, to accompany patient in pre-op .  Children under the age of 87 must have an adult  with them who is not the patient and must remain in the main waiting area with an adult.  If the patient needs to stay at the hospital during part of their recovery, the visitor guidelines for inpatient rooms apply.  Please refer to the [facility] website for the visitor guidelines for any additional information.     Additional instructions for the day of surgery: DO NOT APPLY any lotions, deodorants, cologne, or perfumes.   Do not wear jewelry or makeup Do not wear nail polish, gel polish, artificial nails, or any other type of covering on natural nails (fingers and toes) Do not bring valuables to the hospital. [facility] is not responsible for valuables/personal belongings. Put on clean/comfortable clothes.  Please brush your teeth.  Ask your nurse before applying any prescription medications to the skin.

## 2024-02-24 NOTE — Anesthesia Preprocedure Evaluation (Addendum)
 Anesthesia Evaluation  Patient identified by MRN, date of birth, ID band Patient awake    Reviewed: Allergy & Precautions, H&P , NPO status , Patient's Chart, lab work & pertinent test results, reviewed documented beta blocker date and time   Airway Mallampati: II  TM Distance: >3 FB     Dental  (+) Missing, Dental Advisory Given, Poor Dentition   Pulmonary Current Smoker, former smoker   Pulmonary exam normal breath sounds clear to auscultation       Cardiovascular hypertension, Pt. on medications and Pt. on home beta blockers Normal cardiovascular exam+ Valvular Problems/Murmurs  Rhythm:Regular Rate:Normal  Moderate MS   Neuro/Psych Seizures -, Well Controlled,  Last Sz 10/21/23 Resident of SNF CVA, Residual Symptoms  negative psych ROS   GI/Hepatic negative GI ROS, Neg liver ROS,,,  Endo/Other  negative endocrine ROS  HLD  Renal/GU ESRF and DialysisRenal diseaseLast dialysis-yesterday  negative genitourinary   Musculoskeletal negative musculoskeletal ROS (+)    Abdominal  (+) + obese  Peds negative pediatric ROS (+)  Hematology  (+) Blood dyscrasia, anemia Antiphospholipid syndrome Eliquis  therapy- last dose 9/5   Anesthesia Other Findings lupus  Reproductive/Obstetrics negative OB ROS                              Anesthesia Physical Anesthesia Plan  ASA: 4  Anesthesia Plan: Regional and MAC   Post-op Pain Management: Tylenol  PO (pre-op )* and Regional block*   Induction: Intravenous  PONV Risk Score and Plan: 2 and Ondansetron , Dexamethasone  and Treatment may vary due to age or medical condition  Airway Management Planned: Natural Airway and Simple Face Mask  Additional Equipment:   Intra-op Plan:   Post-operative Plan:   Informed Consent: I have reviewed the patients History and Physical, chart, labs and discussed the procedure including the risks, benefits and  alternatives for the proposed anesthesia with the patient or authorized representative who has indicated his/her understanding and acceptance.     Dental advisory given  Plan Discussed with: CRNA and Anesthesiologist  Anesthesia Plan Comments: (PAT note by Lynwood Hope, PA-C: 43 year old male history of ESRD on HD MWF, HTN, CVA, seizures, antiphospholipid syndrome  Recent admission 5/4 through 11/12/2023.  He was  brought to the ER on 5/5 after patient had a seizure, encephalopathy. Neurology was consulted and started on Keppra  and Depakote . MRI on 5/6 showed no evidence of acute intracranial abnormality, and multiple remote infarcts. Patient remained encephalopathic. Patient had breakthrough seizure on 5/9 while he on HD.  EEG is suggestive of moderate diffuse encephalopathy.  Sharp transients were noted in the right frontal region. MRI obtained and showed Acute right MCA patchy infarct/ left thalamic and caudate head punctuate infarct. MRA head, carotid Dopplers without significant finding.  TTE showed LVEF 65 to 70%, grade 1 DD, normal RV, rheumatic mitral valve with mild regurgitation and moderate stenosis (mean gradient 5 mmHg, stable from prior echo 01/2023.  Neurology recommended increasing home Eliquis  from 2.5 mg twice daily to 5 mg twice daily.  He was put on Keppra  500 mg twice daily and 500 mg MWF after HD and Depakote  1000 mg every 12 hours for seizure prophylaxis.  He was discharged to SNF.  He has a history of multiple left upper extremity dialysis access procedures including 2 fistulas and subsequent graft.  This is now thrombosed and he is on dialysis via catheter.  He was instructed by vascular surgery to hold Eliquis  48 hours  prior to procedure.  Patient will need day of surgery labs and evaluation by assigned anesthesiologist.  EKG 10/20/2023: Sinus rhythm.  Rate 80.  Probable LAE.  LAD.  RSR' in V1 or V2, right VCD or RVH.  Prolonged QT interval (QTc 512)  TTE 10/28/2023:  1. Left  ventricular ejection fraction, by estimation, is 65 to 70%. The  left ventricle has normal function. The left ventricle has no regional  wall motion abnormalities. There is moderate concentric left ventricular  hypertrophy. Left ventricular  diastolic parameters are consistent with Grade I diastolic dysfunction  (impaired relaxation).   2. Right ventricular systolic function is normal. The right ventricular  size is normal. Tricuspid regurgitation signal is inadequate for assessing  PA pressure.   3. Left atrial size was mildly dilated.   4. The mitral valve is calcified and restricted, possible rheumatic  disease. Mild mitral valve regurgitation. Moderate mitral stenosis with  mean gradient 5 mmHg, MVA 1.28 cm^2 by VTI and 1.38 cm^2 by PHT.   5. The aortic valve is tricuspid. There is mild calcification of the  aortic valve. Aortic valve regurgitation is not visualized. No aortic  stenosis is present.   6. The inferior vena cava is normal in size with greater than 50%  respiratory variability, suggesting right atrial pressure of 3 mmHg.   7. Bubble study was unsuccessful.  )         Anesthesia Quick Evaluation

## 2024-02-25 ENCOUNTER — Encounter (HOSPITAL_COMMUNITY)
Admission: RE | Disposition: A | Discharge status: Home / Self Care | Payer: Self-pay | Source: Home / Self Care | Attending: Vascular Surgery

## 2024-02-25 ENCOUNTER — Other Ambulatory Visit: Payer: Self-pay

## 2024-02-25 ENCOUNTER — Ambulatory Visit (HOSPITAL_COMMUNITY)
Admission: RE | Admit: 2024-02-25 | Discharge: 2024-02-25 | Disposition: A | Discharge status: Home / Self Care | Attending: Vascular Surgery | Admitting: Vascular Surgery

## 2024-02-25 ENCOUNTER — Ambulatory Visit (HOSPITAL_COMMUNITY): Admitting: Physician Assistant

## 2024-02-25 ENCOUNTER — Ambulatory Visit (HOSPITAL_BASED_OUTPATIENT_CLINIC_OR_DEPARTMENT_OTHER): Admitting: Physician Assistant

## 2024-02-25 ENCOUNTER — Other Ambulatory Visit (HOSPITAL_COMMUNITY): Payer: Self-pay

## 2024-02-25 ENCOUNTER — Encounter (HOSPITAL_COMMUNITY): Payer: Self-pay | Admitting: Vascular Surgery

## 2024-02-25 DIAGNOSIS — I120 Hypertensive chronic kidney disease with stage 5 chronic kidney disease or end stage renal disease: Secondary | ICD-10-CM | POA: Diagnosis not present

## 2024-02-25 DIAGNOSIS — Z7901 Long term (current) use of anticoagulants: Secondary | ICD-10-CM | POA: Insufficient documentation

## 2024-02-25 DIAGNOSIS — Z87891 Personal history of nicotine dependence: Secondary | ICD-10-CM | POA: Insufficient documentation

## 2024-02-25 DIAGNOSIS — I6990 Unspecified sequelae of unspecified cerebrovascular disease: Secondary | ICD-10-CM | POA: Diagnosis not present

## 2024-02-25 DIAGNOSIS — N186 End stage renal disease: Secondary | ICD-10-CM | POA: Diagnosis present

## 2024-02-25 DIAGNOSIS — R569 Unspecified convulsions: Secondary | ICD-10-CM | POA: Diagnosis not present

## 2024-02-25 DIAGNOSIS — T82590A Other mechanical complication of surgically created arteriovenous fistula, initial encounter: Secondary | ICD-10-CM

## 2024-02-25 DIAGNOSIS — I050 Rheumatic mitral stenosis: Secondary | ICD-10-CM | POA: Diagnosis not present

## 2024-02-25 DIAGNOSIS — Z992 Dependence on renal dialysis: Secondary | ICD-10-CM | POA: Diagnosis not present

## 2024-02-25 DIAGNOSIS — F17210 Nicotine dependence, cigarettes, uncomplicated: Secondary | ICD-10-CM

## 2024-02-25 HISTORY — PX: BASCILIC VEIN TRANSPOSITION: SHX5742

## 2024-02-25 LAB — POCT I-STAT, CHEM 8
BUN: 38 mg/dL — ABNORMAL HIGH (ref 6–20)
Calcium, Ion: 1.1 mmol/L — ABNORMAL LOW (ref 1.15–1.40)
Chloride: 99 mmol/L (ref 98–111)
Creatinine, Ser: 13 mg/dL — ABNORMAL HIGH (ref 0.61–1.24)
Glucose, Bld: 74 mg/dL (ref 70–99)
HCT: 36 % — ABNORMAL LOW (ref 39.0–52.0)
Hemoglobin: 12.2 g/dL — ABNORMAL LOW (ref 13.0–17.0)
Potassium: 4.6 mmol/L (ref 3.5–5.1)
Sodium: 137 mmol/L (ref 135–145)
TCO2: 28 mmol/L (ref 22–32)

## 2024-02-25 LAB — SURGICAL PCR SCREEN
MRSA, PCR: NEGATIVE
Staphylococcus aureus: NEGATIVE

## 2024-02-25 SURGERY — TRANSPOSITION, VEIN, BASILIC
Anesthesia: Monitor Anesthesia Care | Laterality: Right

## 2024-02-25 MED ORDER — ONDANSETRON HCL 4 MG/2ML IJ SOLN
INTRAMUSCULAR | Status: AC
  Filled 2024-02-25: qty 2

## 2024-02-25 MED ORDER — DEXAMETHASONE SODIUM PHOSPHATE 10 MG/ML IJ SOLN
INTRAMUSCULAR | Status: AC
  Filled 2024-02-25: qty 1

## 2024-02-25 MED ORDER — METOPROLOL SUCCINATE ER 25 MG PO TB24
25.0000 mg | ORAL_TABLET | Freq: Once | ORAL | Status: AC
Start: 2024-02-25 — End: 2024-02-25

## 2024-02-25 MED ORDER — HEPARIN 6000 UNIT IRRIGATION SOLUTION
Status: DC | PRN
  Administered 2024-02-25: 1

## 2024-02-25 MED ORDER — EPHEDRINE 5 MG/ML INJ
INTRAVENOUS | Status: AC
  Filled 2024-02-25: qty 5

## 2024-02-25 MED ORDER — PROPOFOL 10 MG/ML IV BOLUS
INTRAVENOUS | Status: AC
  Filled 2024-02-25: qty 20

## 2024-02-25 MED ORDER — ORAL CARE MOUTH RINSE: 15.0000 mL | Freq: Once | OROMUCOSAL | Status: DC

## 2024-02-25 MED ORDER — EPHEDRINE SULFATE-NACL 50-0.9 MG/10ML-% IV SOSY
PREFILLED_SYRINGE | INTRAVENOUS | Status: DC | PRN
  Administered 2024-02-25: 10 mg via INTRAVENOUS
  Administered 2024-02-25: 5 mg via INTRAVENOUS

## 2024-02-25 MED ORDER — HEMOSTATIC AGENTS (NO CHARGE) OPTIME
TOPICAL | Status: DC | PRN
  Administered 2024-02-25: 1 via TOPICAL

## 2024-02-25 MED ORDER — PHENYLEPHRINE HCL-NACL 20-0.9 MG/250ML-% IV SOLN
INTRAVENOUS | Status: DC | PRN
  Administered 2024-02-25: 50 ug/min via INTRAVENOUS

## 2024-02-25 MED ORDER — LIDOCAINE 2% (20 MG/ML) 5 ML SYRINGE
INTRAMUSCULAR | Status: AC
  Filled 2024-02-25: qty 5

## 2024-02-25 MED ORDER — PROPOFOL 10 MG/ML IV BOLUS
INTRAVENOUS | Status: DC | PRN
  Administered 2024-02-25: 20 mg via INTRAVENOUS

## 2024-02-25 MED ORDER — CEFAZOLIN SODIUM-DEXTROSE 3-4 GM/150ML-% IV SOLN
3.0000 g | INTRAVENOUS | Status: AC
Start: 2024-02-25 — End: 2024-02-25
  Administered 2024-02-25: 3 g via INTRAVENOUS
  Filled 2024-02-25: qty 150

## 2024-02-25 MED ORDER — FENTANYL CITRATE (PF) 250 MCG/5ML IJ SOLN
INTRAMUSCULAR | Status: DC | PRN
  Administered 2024-02-25: 25 ug via INTRAVENOUS

## 2024-02-25 MED ORDER — SODIUM CHLORIDE 0.9 % IV SOLN: INTRAVENOUS | Status: DC

## 2024-02-25 MED ORDER — LIDOCAINE-EPINEPHRINE (PF) 1 %-1:200000 IJ SOLN
INTRAMUSCULAR | Status: AC
  Filled 2024-02-25: qty 30

## 2024-02-25 MED ORDER — LIDOCAINE-EPINEPHRINE (PF) 1 %-1:200000 IJ SOLN
INTRAMUSCULAR | Status: DC | PRN
  Administered 2024-02-25: 20 mL

## 2024-02-25 MED ORDER — CHLORHEXIDINE GLUCONATE 0.12 % MT SOLN
OROMUCOSAL | Status: AC
  Filled 2024-02-25: qty 15

## 2024-02-25 MED ORDER — 0.9 % SODIUM CHLORIDE (POUR BTL) OPTIME
TOPICAL | Status: DC | PRN
  Administered 2024-02-25: 1000 mL

## 2024-02-25 MED ORDER — HEPARIN SODIUM (PORCINE) 1000 UNIT/ML IJ SOLN
INTRAMUSCULAR | Status: AC
  Filled 2024-02-25: qty 2

## 2024-02-25 MED ORDER — FENTANYL CITRATE (PF) 250 MCG/5ML IJ SOLN
INTRAMUSCULAR | Status: AC
  Filled 2024-02-25: qty 5

## 2024-02-25 MED ORDER — MIDAZOLAM HCL 2 MG/2ML IJ SOLN
INTRAMUSCULAR | Status: AC
  Filled 2024-02-25: qty 2

## 2024-02-25 MED ORDER — METOPROLOL SUCCINATE ER 25 MG PO TB24
ORAL_TABLET | ORAL | Status: AC
  Administered 2024-02-25: 25 mg via ORAL
  Filled 2024-02-25: qty 1

## 2024-02-25 MED ORDER — HYDROCODONE-ACETAMINOPHEN 5-325 MG PO TABS
1.0000 | ORAL_TABLET | Freq: Four times a day (QID) | ORAL | 0 refills | Status: DC | PRN
  Filled 2024-02-25: qty 20, 5d supply, fill #0

## 2024-02-25 MED ORDER — HEPARIN SODIUM (PORCINE) 1000 UNIT/ML IJ SOLN
1600.0000 [IU] | Freq: Once | INTRAMUSCULAR | Status: AC
  Administered 2024-02-25: 1600 [IU]

## 2024-02-25 MED ORDER — LIDOCAINE HCL 1 % IJ SOLN
INTRAMUSCULAR | Status: AC
  Filled 2024-02-25: qty 20

## 2024-02-25 MED ORDER — GLYCOPYRROLATE PF 0.2 MG/ML IJ SOSY
PREFILLED_SYRINGE | INTRAMUSCULAR | Status: AC
  Filled 2024-02-25: qty 1

## 2024-02-25 MED ORDER — ONDANSETRON HCL 4 MG/2ML IJ SOLN
INTRAMUSCULAR | Status: DC | PRN
  Administered 2024-02-25: 4 mg via INTRAVENOUS

## 2024-02-25 MED ORDER — PROPOFOL 500 MG/50ML IV EMUL
INTRAVENOUS | Status: DC | PRN
  Administered 2024-02-25: 75 ug/kg/min via INTRAVENOUS

## 2024-02-25 MED ORDER — CHLORHEXIDINE GLUCONATE 0.12 % MT SOLN: 15.0000 mL | Freq: Once | OROMUCOSAL | Status: DC

## 2024-02-25 MED ORDER — ROPIVACAINE HCL 5 MG/ML IJ SOLN
INTRAMUSCULAR | Status: DC | PRN
  Administered 2024-02-25: 30 mL via PERINEURAL

## 2024-02-25 MED ORDER — LEVETIRACETAM 500 MG PO TABS
500.0000 mg | ORAL_TABLET | Freq: Once | ORAL | Status: AC
Start: 2024-02-25 — End: 2024-02-25
  Administered 2024-02-25: 500 mg via ORAL
  Filled 2024-02-25: qty 1

## 2024-02-25 MED ORDER — HEPARIN 6000 UNIT IRRIGATION SOLUTION
Status: AC
  Filled 2024-02-25: qty 500

## 2024-02-25 SURGICAL SUPPLY — 28 items
ARMBAND PINK RESTRICT EXTREMIT (MISCELLANEOUS) ×1 IMPLANT
BAG COUNTER SPONGE SURGICOUNT (BAG) ×1 IMPLANT
CANISTER SUCTION 3000ML PPV (SUCTIONS) ×1 IMPLANT
CLIP LIGATING EXTRA MED SLVR (CLIP) ×1 IMPLANT
CLIP LIGATING EXTRA SM BLUE (MISCELLANEOUS) ×1 IMPLANT
COVER PROBE W GEL 5X96 (DRAPES) ×1 IMPLANT
DERMABOND ADVANCED .7 DNX12 (GAUZE/BANDAGES/DRESSINGS) ×1 IMPLANT
ELECTRODE REM PT RTRN 9FT ADLT (ELECTROSURGICAL) ×1 IMPLANT
GLOVE BIO SURGEON STRL SZ7.5 (GLOVE) ×1 IMPLANT
GOWN STRL REUS W/ TWL LRG LVL3 (GOWN DISPOSABLE) ×2 IMPLANT
GOWN STRL REUS W/ TWL XL LVL3 (GOWN DISPOSABLE) ×1 IMPLANT
HEMOSTAT SNOW SURGICEL 2X4 (HEMOSTASIS) IMPLANT
KIT BASIN OR (CUSTOM PROCEDURE TRAY) ×1 IMPLANT
KIT TURNOVER KIT B (KITS) ×1 IMPLANT
NS IRRIG 1000ML POUR BTL (IV SOLUTION) ×1 IMPLANT
PACK CV ACCESS (CUSTOM PROCEDURE TRAY) ×1 IMPLANT
PAD ARMBOARD POSITIONER FOAM (MISCELLANEOUS) ×2 IMPLANT
POWDER SURGICEL 3.0 GRAM (HEMOSTASIS) IMPLANT
SLING ARM FOAM STRAP LRG (SOFTGOODS) IMPLANT
SUT MNCRL AB 4-0 PS2 18 (SUTURE) ×1 IMPLANT
SUT PROLENE 5 0 C 1 24 (SUTURE) IMPLANT
SUT PROLENE 6 0 BV (SUTURE) ×1 IMPLANT
SUT SILK 2 0 SH (SUTURE) IMPLANT
SUT SILK 2-0 18XBRD TIE 12 (SUTURE) IMPLANT
SUT VIC AB 3-0 SH 27X BRD (SUTURE) ×1 IMPLANT
TOWEL GREEN STERILE (TOWEL DISPOSABLE) ×1 IMPLANT
UNDERPAD 30X36 HEAVY ABSORB (UNDERPADS AND DIAPERS) ×1 IMPLANT
WATER STERILE IRR 1000ML POUR (IV SOLUTION) ×1 IMPLANT

## 2024-02-25 NOTE — Discharge Instructions (Addendum)
   [facility]  Discharge Instructions  AV Fistula or Graft Surgery for Dialysis Access  Please refer to the following instructions for your post-procedure care. Your surgeon or physician assistant will discuss any changes with you.  Activity  You may drive the day following your surgery, if you are comfortable and no longer taking prescription pain medication. Resume full activity as the soreness in your incision resolves.  Bathing/Showering  You may shower after you go home. Keep your incision dry for 48 hours. Do not soak in a bathtub, hot tub, or swim until the incision heals completely. You may not shower if you have a hemodialysis catheter.  Incision Care  Clean your incision with mild soap and water  after 48 hours. Pat the area dry with a clean towel. You do not need a bandage unless otherwise instructed. Do not apply any ointments or creams to your incision. You may have skin glue on your incision. Do not peel it off. It will come off on its own in about one week. Your arm may swell a bit after surgery. To reduce swelling use pillows to elevate your arm so it is above your heart. Your doctor will tell you if you need to lightly wrap your arm with an ACE bandage.  Diet  Resume your normal diet. There are not special food restrictions following this procedure. In order to heal from your surgery, it is CRITICAL to get adequate nutrition. Your body requires vitamins, minerals, and protein. Vegetables are the best source of vitamins and minerals. Vegetables also provide the perfect balance of protein. Processed food has little nutritional value, so try to avoid this.  Medications  Resume taking all of your medications. If your incision is causing pain, you may take over-the counter pain relievers such as acetaminophen  (Tylenol ). If you were prescribed a stronger pain medication, please be aware these medications can cause nausea and constipation. Prevent  nausea by taking the medication with a snack or meal. Avoid constipation by drinking plenty of fluids and eating foods with high amount of fiber, such as fruits, vegetables, and grains.  Do not take Tylenol  if you are taking prescription pain medications.  Follow up Your surgeon may want to see you in the office following your access surgery. If so, this will be arranged at the time of your surgery.  Please call us  immediately for any of the following conditions:  Increased pain, redness, drainage (pus) from your incision site Fever of 101 degrees or higher Severe or worsening pain at your incision site Hand pain or numbness.  Reduce your risk of vascular disease:  Stop smoking. If you would like help, call [facility] at 1-800-QUIT-NOW ((660)671-6728) or [place] at 442-278-9055  Manage your cholesterol Maintain a desired weight Control your diabetes Keep your blood pressure down  Dialysis  It will take several weeks to several months for your new dialysis access to be ready for use. Your surgeon will determine when it is okay to use it. Your nephrologist will continue to direct your dialysis. You can continue to use your Permcath until your new access is ready for use.   02/25/2024 NAREK KNISS [id number] 10/14/1980  Surgeon(s): Sheree Penne Bruckner, MD  Procedure(s): TRANSPOSITION, VEIN, BASILIC   May stick graft immediately   May stick graft on designated area only:    Do not stick AVF for 4 weeks    If you have any questions, please call the office at [phone].

## 2024-02-25 NOTE — Anesthesia Postprocedure Evaluation (Signed)
 Anesthesia Post Note  Patient: [name] [name]  Procedure(s) Performed: TRANSPOSITION, VEIN, BASILIC (Right)     Patient location during evaluation: PACU Anesthesia Type: Regional and MAC Level of consciousness: awake and alert and oriented Pain management: pain level controlled Vital Signs Assessment: post-procedure vital signs reviewed and stable Respiratory status: spontaneous breathing, nonlabored ventilation and respiratory function stable Cardiovascular status: stable and blood pressure returned to baseline Postop Assessment: no apparent nausea or vomiting Anesthetic complications: no   No notable events documented.  Last Vitals:  Vitals:   02/25/24 1015 02/25/24 1030  BP: 98/63 98/61  Pulse: 74 72  Resp: 12 14  Temp:    SpO2: 90% 95%    Last Pain:  Vitals:   02/25/24 1030  TempSrc:   PainSc: 0-No pain                 Emya Picado A.

## 2024-02-25 NOTE — Anesthesia Procedure Notes (Signed)
 Procedure Name: MAC Date/Time: 02/25/2024 7:35 AM  Performed by: Boyce Shilling, CRNAPre-anesthesia Checklist: Patient identified, Emergency Drugs available, Suction available, Timeout performed and Patient being monitored Patient Re-evaluated:Patient Re-evaluated prior to induction Oxygen Delivery Method: Simple face mask Induction Type: IV induction Dental Injury: Teeth and Oropharynx as per pre-operative assessment

## 2024-02-25 NOTE — Anesthesia Procedure Notes (Signed)
 Anesthesia Regional Block: Interscalene brachial plexus block   Pre-Anesthetic Checklist: , timeout performed,  Correct Patient, Correct Site, Correct Laterality,  Correct Procedure, Correct Position, site marked,  Risks and benefits discussed,  Surgical consent,  Pre-op  evaluation,  At surgeon's request  Laterality: Right  Prep: chloraprep       Needles:  Injection technique: Single-shot  Needle Type: Echogenic Stimulator Needle     Needle Length: 10cm  Needle Gauge: 21   Needle insertion depth: 7 cm   Additional Needles:   Procedures:,,,, ultrasound used (permanent image in chart),,   Motor weakness within 5 minutes.  Narrative:  Start time: 02/25/2024 7:19 AM End time: 02/25/2024 7:24 AM Injection made incrementally with aspirations every 5 mL.  Performed by: Personally  Anesthesiologist: Jerrye Sharper, MD  Additional Notes: Timeout performed. Patient sedated. Relevant anatomy ID'd using US . Incremental 2-5ml injection of LA with frequent aspiration. Patient tolerated procedure well.

## 2024-02-25 NOTE — Op Note (Signed)
 Patient name: [name] [name] MRN: [id number] DOB: 10/01/1980 Sex: male  02/25/2024 Pre-operative Diagnosis: End-stage renal disease Post-operative diagnosis:  Same Surgeon:  Penne C. Sheree, MD Assistant: Adina Sender, PA Procedure Performed:  Revision of right arm basilic vein fistula with transposition  Indications: 43 year old male with history of end-stage renal disease currently dialyzing via catheter.  He has a basilic vein fistula in the upper arm which has adequate flow but requires transposition for his access.  Given the complexity of the case,  the assistant was necessary in order to expedite the procedure and safely perform the technical aspects of the operation.  The assistant provided traction and countertraction to assist with exposure of the artery and vein.  They also assisted with suture ligation of multiple venous branches.  They played a critical role in the anastomosis. These skills, especially following the Prolene suture for the anastomosis, could not have been adequately performed by a scrub tech assistant.    Findings: The fistula throughout the upper arm was very large, there were multiple branches were divided between ties.  Near the anastomosis there was approximately 2 cm of sclerosis which was all excised and a new arteriovenous anastomosis was created in an end-to-side fashion.  At completion there was a very strong thrill in the fistula as well as a palpable radial artery pulse at the wrist.   Procedure:  The patient was identified in the holding area and taken to the operating room where he was placed supine operative table and MAC anesthesia was induced.  He was sterilely prepped and draped in the right upper extremity in usual fashion, antibiotics were administered and a timeout was called.  A preoperative block tube in place this was initially tested and did not appear adequate.  We used ultrasound to identify the fistula throughout follow-up with 1% lidocaine   to anesthetize the right and incisional sites.  We then reopened his previous incision and the antecubitum dissected down into dense scar tissue.  I could actually visualize the fascia of the wrist and ultimately 2 additional incisions were made throughout the upper arm and the fistula was dissected free from the underlying nerve and soft tissue and the nerve was protected.  Branches were divided between ties.  We got back to the antecubitum we then tediously dissected out the artery proximally distally and obtained proximal and distal control using Vesseloops.  Ultimately we elected to clamp the artery proximally distally and transected fistula.  The fistula was then marked for orientation portion of the dressing and clamped in the axilla.  I then dissected sharply back to using the fistula as a guide open reestablish possible way back to the arteriovenous anastomosis where it was quite sclerotic.  We then flushed with heparinized saline through the artery in both directions.  The vein was then tunneled laterally and I did excise approximately a total of 2 cm of the vein.  Both ends were spatulated and the vein was then resewn end-to-side to the brachial artery where the previous anastomosis had been created.  Prior to completion of the functional erections.  Upon completion there is a very strong thrill in the fistula and a palpable radial artery pulse at the wrist.  We obtained stasis irrigated wounds and closed in layers of Vicryl and Monocryl.  Dermabond was placed at the skin level.  The patient was awakened from anesthesia having tolerated the procedure without any complication.  All counts were prior to completion.  EBL: 100 cc  Deaisa Merida C. Sheree, MD [facility] [place] Office: [phone] Pager: [phone]

## 2024-02-25 NOTE — Interval H&P Note (Signed)
 History and Physical Interval Note:  02/25/2024 7:20 AM  [name] [name]  has presented today for surgery, with the diagnosis of ESRD.  The various methods of treatment have been discussed with the patient and family. After consideration of risks, benefits and other options for treatment, the patient has consented to  Procedure(s): TRANSPOSITION, VEIN, BASILIC (Right) as a surgical intervention.  The patient's history has been reviewed, patient examined, no change in status, stable for surgery.  I have reviewed the patient's chart and labs.  Questions were answered to the patient's satisfaction.     [name]

## 2024-02-25 NOTE — Transfer of Care (Signed)
 Immediate Anesthesia Transfer of Care Note  Patient: [name] [name]  Procedure(s) Performed: TRANSPOSITION, VEIN, BASILIC (Right)  Patient Location: PACU  Anesthesia Type:MAC combined with regional for post-op pain  Level of Consciousness: awake and alert   Airway & Oxygen Therapy: Patient Spontanous Breathing  Post-op Assessment: Report given to RN and Post -op Vital signs reviewed and stable  Post vital signs: Reviewed and stable  Last Vitals:  Vitals Value Taken Time  BP 102/64 02/25/24 10:00  Temp    Pulse 77 02/25/24 10:02  Resp 14 02/25/24 10:02  SpO2 91 % 02/25/24 10:02  Vitals shown include unfiled device data.  Last Pain:  Vitals:   02/25/24 0627  TempSrc:   PainSc: 0-No pain         Complications: No notable events documented.

## 2024-02-26 ENCOUNTER — Encounter (HOSPITAL_COMMUNITY): Payer: Self-pay | Admitting: Vascular Surgery

## 2024-02-26 LAB — POCT I-STAT, CHEM 8
BUN: 61 mg/dL — ABNORMAL HIGH (ref 6–20)
Calcium, Ion: 0.92 mmol/L — ABNORMAL LOW (ref 1.15–1.40)
Chloride: 99 mmol/L (ref 98–111)
Creatinine, Ser: 12.4 mg/dL — ABNORMAL HIGH (ref 0.61–1.24)
Glucose, Bld: 62 mg/dL — ABNORMAL LOW (ref 70–99)
HCT: 43 % (ref 39.0–52.0)
Hemoglobin: 14.6 g/dL (ref 13.0–17.0)
Potassium: 6.7 mmol/L (ref 3.5–5.1)
Sodium: 134 mmol/L — ABNORMAL LOW (ref 135–145)
TCO2: 30 mmol/L (ref 22–32)

## 2024-03-01 ENCOUNTER — Other Ambulatory Visit: Payer: Self-pay

## 2024-03-01 ENCOUNTER — Encounter (HOSPITAL_COMMUNITY): Payer: Self-pay | Admitting: *Deleted

## 2024-03-01 ENCOUNTER — Emergency Department (HOSPITAL_COMMUNITY)
Admission: EM | Admit: 2024-03-01 | Discharge: 2024-03-01 | Disposition: A | Discharge status: Home / Self Care | Attending: Emergency Medicine | Admitting: Emergency Medicine

## 2024-03-01 ENCOUNTER — Emergency Department (HOSPITAL_COMMUNITY)

## 2024-03-01 DIAGNOSIS — N186 End stage renal disease: Secondary | ICD-10-CM | POA: Diagnosis not present

## 2024-03-01 DIAGNOSIS — Z7901 Long term (current) use of anticoagulants: Secondary | ICD-10-CM | POA: Insufficient documentation

## 2024-03-01 DIAGNOSIS — Z992 Dependence on renal dialysis: Secondary | ICD-10-CM | POA: Insufficient documentation

## 2024-03-01 DIAGNOSIS — G8918 Other acute postprocedural pain: Secondary | ICD-10-CM | POA: Diagnosis not present

## 2024-03-01 DIAGNOSIS — U071 COVID-19: Secondary | ICD-10-CM | POA: Diagnosis present

## 2024-03-01 DIAGNOSIS — M7989 Other specified soft tissue disorders: Secondary | ICD-10-CM | POA: Diagnosis not present

## 2024-03-01 DIAGNOSIS — Z7982 Long term (current) use of aspirin: Secondary | ICD-10-CM | POA: Diagnosis not present

## 2024-03-01 DIAGNOSIS — I120 Hypertensive chronic kidney disease with stage 5 chronic kidney disease or end stage renal disease: Secondary | ICD-10-CM | POA: Diagnosis not present

## 2024-03-01 LAB — CBC WITH DIFFERENTIAL/PLATELET
Abs Immature Granulocytes: 0.04 K/uL (ref 0.00–0.07)
Basophils Absolute: 0 K/uL (ref 0.0–0.1)
Basophils Relative: 0 %
Eosinophils Absolute: 0.2 K/uL (ref 0.0–0.5)
Eosinophils Relative: 2 %
HCT: 27.4 % — ABNORMAL LOW (ref 39.0–52.0)
Hemoglobin: 8.8 g/dL — ABNORMAL LOW (ref 13.0–17.0)
Immature Granulocytes: 1 %
Lymphocytes Relative: 19 %
Lymphs Abs: 1.4 K/uL (ref 0.7–4.0)
MCH: 34.8 pg — ABNORMAL HIGH (ref 26.0–34.0)
MCHC: 32.1 g/dL (ref 30.0–36.0)
MCV: 108.3 fL — ABNORMAL HIGH (ref 80.0–100.0)
Monocytes Absolute: 1.6 K/uL — ABNORMAL HIGH (ref 0.1–1.0)
Monocytes Relative: 22 %
Neutro Abs: 4.2 K/uL (ref 1.7–7.7)
Neutrophils Relative %: 56 %
Platelets: 72 K/uL — ABNORMAL LOW (ref 150–400)
RBC: 2.53 MIL/uL — ABNORMAL LOW (ref 4.22–5.81)
RDW: 14.9 % (ref 11.5–15.5)
WBC: 7.4 K/uL (ref 4.0–10.5)
nRBC: 0 % (ref 0.0–0.2)

## 2024-03-01 LAB — BASIC METABOLIC PANEL WITH GFR
Anion gap: 14 (ref 5–15)
BUN: 62 mg/dL — ABNORMAL HIGH (ref 6–20)
CO2: 24 mmol/L (ref 22–32)
Calcium: 8.3 mg/dL — ABNORMAL LOW (ref 8.9–10.3)
Chloride: 95 mmol/L — ABNORMAL LOW (ref 98–111)
Creatinine, Ser: 17.42 mg/dL — ABNORMAL HIGH (ref 0.61–1.24)
GFR, Estimated: 3 mL/min — ABNORMAL LOW (ref >60)
Glucose, Bld: 91 mg/dL (ref 70–99)
Potassium: 4.5 mmol/L (ref 3.5–5.1)
Sodium: 133 mmol/L — ABNORMAL LOW (ref 135–145)

## 2024-03-01 MED ORDER — CYCLOBENZAPRINE HCL 10 MG PO TABS: 10.0000 mg | ORAL_TABLET | Freq: Two times a day (BID) | ORAL | 0 refills | Status: AC | PRN

## 2024-03-01 MED ORDER — GUAIFENESIN-DM 100-10 MG/5ML PO SYRP: 5.0000 mL | ORAL_SOLUTION | ORAL | 0 refills | Status: DC | PRN

## 2024-03-01 MED ORDER — GUAIFENESIN-CODEINE 100-10 MG/5ML PO SOLN
5.0000 mL | Freq: Once | ORAL | Status: AC
  Administered 2024-03-01: 5 mL via ORAL
  Filled 2024-03-01: qty 5

## 2024-03-01 MED ORDER — HYDROCODONE-ACETAMINOPHEN 5-325 MG PO TABS: 1.0000 | ORAL_TABLET | Freq: Once | ORAL | Status: DC

## 2024-03-01 MED ORDER — HYDROCODONE-ACETAMINOPHEN 5-325 MG PO TABS: 1.0000 | ORAL_TABLET | Freq: Four times a day (QID) | ORAL | 0 refills | Status: DC | PRN

## 2024-03-01 NOTE — ED Provider Notes (Signed)
 [place] EMERGENCY DEPARTMENT AT [facility] Provider Note  CSN: [id number] Arrival date & time: 03/01/24 1359  Chief Complaint(s) Post-op Problem  HPI [name] [name] is a 43 y.o. male with past medical history as below, significant for ESRD on HD, aplastic anemia, CVA, lupus anticoagulant who presents to the ED with complaint of arm pain, COVID  Patient had basilic vein transposition to his right arm on 9/9 with Dr. Penne Angus.  He had brachial basilic AV fistula creation to his right arm on 7/8 also with Dr. Angus.  Over the past couple days he has noticed worsening pain and swelling to his right arm, tingling to his fingers.  Difficulty moving his right arm secondary to the pain and swelling.  He has resumed his oral anticoagulation.  He gets his dialysis through his chest catheter, last dialysis session was on Wednesday, did not get dialysis on Friday because he got to a verbal altercation with staff at the dialysis center on Friday  Patient also test positive COVID-19 on Friday  Past Medical History Past Medical History:  Diagnosis Date   ESRD on hemodialysis (HCC)    Hypertension    Lupus    Seizures (HCC)    Seizures (HCC)    Stroke (HCC)    60 or 43 years of age.     Wears glasses    Patient Active Problem List   Diagnosis Date Noted   Hypoalbuminemia 01/09/2024   Aplastic anemia (HCC) 01/07/2024   Mitral stenosis 01/07/2024   Chronic health problem 01/06/2024   Prolonged Q-T interval on ECG 01/06/2024   Acute encephalopathy 10/21/2023   Acute metabolic encephalopathy 10/21/2023   History of stroke 08/01/2023   Cerebrovascular accident (CVA) due to embolism of cerebral artery (HCC) 04/04/2023   Seizure (HCC) 01/19/2023   Acute respiratory failure with hypoxia (HCC) 01/19/2023   Status epilepticus (HCC) 08/10/2022   ESRD on hemodialysis (HCC) 02/15/2022   Other disorders of phosphorus metabolism 08/18/2021   Volume overload 05/10/2021   Allergy,  unspecified, initial encounter 04/19/2021   Anaphylactic shock, unspecified, sequela 04/19/2021   Pruritus, unspecified 04/19/2021   Hypocalcemia 04/01/2021   Long-term current use of anticonvulsant 08/04/2020   COVID-19 07/26/2020   Breakthrough seizure (HCC) 07/20/2020   Acute respiratory failure with hypoxia (HCC)    Lupus    Seizure, late effect of stroke (HCC) 08/26/2019   Secondary hyperparathyroidism of renal origin (HCC) 11/05/2018   Anemia 09/23/2018   ESRD (end stage renal disease) (HCC) 03/18/2018   Prediabetes 01/10/2018   Chest pain in adult    CKD (chronic kidney disease) stage 3, GFR 30-59 ml/min (HCC) 10/24/2015   Seizures (HCC) 10/24/2015   HTN (hypertension) 01/12/2015   Chronic anticoagulation 04/24/2011   Lupus anticoagulant positive 04/24/2011   OBESITY, NOS 08/15/2006   Thrombocytopenia (HCC) 08/15/2006   TOBACCO DEPENDENCE 08/15/2006   CVA 08/15/2006   Home Medication(s) Prior to Admission medications   Medication Sig Start Date End Date Taking? Authorizing Provider  acetaminophen  (TYLENOL ) 325 MG tablet Take 2 tablets (650 mg total) by mouth every 6 (six) hours as needed for mild pain. 02/13/23   Danton Reyes DASEN, MD  apixaban  (ELIQUIS ) 5 MG TABS tablet Take 1 tablet (5 mg total) by mouth 2 (two) times daily. 11/12/23   Kathrin Mignon DASEN, MD  aspirin  81 MG chewable tablet Chew 1 tablet (81 mg total) by mouth daily. Patient taking differently: Chew 81 mg by mouth daily in the afternoon. 01/13/24   Shitarev,  Dimitry, MD  atorvastatin  (LIPITOR) 40 MG tablet Take 1 tablet (40 mg total) by mouth daily. Patient taking differently: Take 40 mg by mouth daily at 6 PM. 08/03/23   Darci Pore, MD  AURYXIA  1 GM 210 MG(Fe) tablet Take 840 mg by mouth See admin instructions. Take 4 tablets (840 mg) by mouth on Mondays, Wednesdays, & Fridays at 1800. Take 4 tablets (840 mg) by mouth on Tuesdays, Thursdays, Saturdays & Sundays 3 times daily (0730, 1200 & 1700) 10/07/23    [provider]  divalproex  (DEPAKOTE ) 500 MG DR tablet Take 2 tablets (1,000 mg total) by mouth every 12 (twelve) hours. Patient taking differently: Take 1,000 mg by mouth every 12 (twelve) hours. (0600 & 1800) 11/12/23   Gonfa, Taye T, MD  HYDROcodone -acetaminophen  (NORCO/VICODIN) 5-325 MG tablet Take 1 tablet by mouth every 6 (six) hours as needed for moderate pain (pain score 4-6). 02/25/24   Bethanie Cough, PA-C  imiquimod  (ALDARA ) 5 % cream Apply to affected area three times weekly for 8 weeks. Patient taking differently: Apply 1 Application topically every Tuesday, Thursday, and Saturday at 6 PM. Apply to affected area three times weekly for 8 weeks. 01/13/24 01/12/25  Shitarev, Dimitry, MD  levETIRAcetam  (KEPPRA ) 500 MG tablet Take 1 tablet (500 mg total) by mouth 2 (two) times daily. Patient taking differently: Take 500 mg by mouth 2 (two) times daily. (1400 & 2000) 01/12/24   Alba Sharper, MD  losartan  (COZAAR ) 50 MG tablet Take 50 mg by mouth daily in the afternoon.    [provider]  metoprolol  succinate (TOPROL -XL) 25 MG 24 hr tablet Take 1 tablet (25 mg total) by mouth daily. Patient taking differently: Take 25 mg by mouth daily in the afternoon. 01/13/24   Shitarev, Dimitry, MD  Multiple Vitamins-Minerals (MULTIVITAMIN WITH MINERALS) tablet Take 1 tablet by mouth daily in the afternoon.    [provider]                                                                                                                                    Past Surgical History Past Surgical History:  Procedure Laterality Date   AV FISTULA PLACEMENT Left 12/30/2017   Procedure: ARTERIOVENOUS (AV) FISTULA CREATION VERSUS INSERTION OF ARTERIOVENOUS GRAFT LEFT ARM;  Surgeon: Oris Krystal FALCON, MD;  Location: [facility] OR;  Service: Vascular;  Laterality: Left;   AV FISTULA PLACEMENT Right 12/24/2023   Procedure: BRACHIOBASCILIC ARTERIOVENOUS (AV) FISTULA CREATION;  Surgeon: Sheree Penne Bruckner, MD;  Location: [facility] OR;  Service: Vascular;  Laterality: Right;   BASCILIC VEIN TRANSPOSITION Left 08/15/2018   Procedure: LEFT FIRST STAGE BASCILIC VEIN TRANSPOSITION;  Surgeon: Oris Krystal FALCON, MD;  Location: [facility] OR;  Service: Vascular;  Laterality: Left;   BASCILIC VEIN TRANSPOSITION Left 10/30/2018   Procedure: INSERTION OF GORE-TEX GRAFT LEFT UPPER ARM;  Surgeon: Sheree Penne Bruckner, MD;  Location: [facility] OR;  Service: Vascular;  Laterality:  Left;   BASCILIC VEIN TRANSPOSITION Right 02/25/2024   Procedure: TRANSPOSITION, VEIN, BASILIC;  Surgeon: Sheree Penne Bruckner, MD;  Location: [facility] OR;  Service: Vascular;  Laterality: Right;   INSERTION OF DIALYSIS CATHETER N/A 09/20/2018   Procedure: INSERTION OF TUNNELED DIALYSIS CATHETER RIGHT INTERNAL JUGULAR;  Surgeon: Gretta Bruckner PARAS, MD;  Location: [facility] OR;  Service: Vascular;  Laterality: N/A;   IR FLUORO GUIDE CV LINE RIGHT  07/16/2022   IR US  GUIDE VASC ACCESS RIGHT  07/16/2022   RENAL BIOPSY     Family History Family History  Problem Relation Age of Onset   Diabetes Mother    Diabetes Maternal Grandmother    Seizures Neg Hx        not on mother's side. he doesn't know about his father's side    Social History Social History   Tobacco Use   Smoking status: Former    Types: E-cigarettes   Smokeless tobacco: Never  Vaping Use   Vaping status: Some Days  Substance Use Topics   Alcohol use: Not Currently   Drug use: No   Allergies Cetirizine & related and Hibiclens  [chlorhexidine  gluconate]  Review of Systems A thorough review of systems was obtained and all systems are negative except as noted in the HPI and PMH.   Physical Exam Vital Signs  I have reviewed the triage vital signs BP (!) 156/109   Pulse 83   Temp 98.6 F (37 C)   Resp 16   Ht 6' (1.829 m)   Wt 122 kg   SpO2 100%   BMI 36.48 kg/m  Physical Exam Vitals and nursing note reviewed.  Constitutional:      General: He is not in acute  distress.    Appearance: Normal appearance. He is well-developed. He is not ill-appearing.  HENT:     Head: Normocephalic and atraumatic.     Right Ear: External ear normal.     Left Ear: External ear normal.     Nose: Nose normal.     Mouth/Throat:     Mouth: Mucous membranes are moist.  Eyes:     General: No scleral icterus.       Right eye: No discharge.        Left eye: No discharge.  Cardiovascular:     Rate and Rhythm: Normal rate.  Pulmonary:     Effort: Pulmonary effort is normal. No respiratory distress.     Breath sounds: No stridor.  Chest:    Abdominal:     General: Abdomen is flat. There is no distension.     Tenderness: There is no guarding.  Musculoskeletal:        General: No deformity.       Arms:     Cervical back: No rigidity.  Skin:    General: Skin is warm and dry.     Coloration: Skin is not cyanotic, jaundiced or pale.  Neurological:     Mental Status: He is alert.  Psychiatric:        Speech: Speech normal.        Behavior: Behavior normal. Behavior is cooperative.     ED Results and Treatments Labs (all labs ordered are listed, but only abnormal results are displayed) Labs Reviewed  CBC WITH DIFFERENTIAL/PLATELET - Abnormal; Notable for the following components:      Result Value   RBC 2.53 (*)    Hemoglobin 8.8 (*)    HCT 27.4 (*)    MCV 108.3 (*)  MCH 34.8 (*)    Platelets 72 (*)    Monocytes Absolute 1.6 (*)    All other components within normal limits  BASIC METABOLIC PANEL WITH GFR - Abnormal; Notable for the following components:   Sodium 133 (*)    Chloride 95 (*)    BUN 62 (*)    Creatinine, Ser 17.42 (*)    Calcium  8.3 (*)    GFR, Estimated 3 (*)    All other components within normal limits                                                                                                                          Radiology DG Chest Portable 1 View Result Date: 03/01/2024 CLINICAL DATA:  Cough. EXAM: PORTABLE CHEST 1  VIEW COMPARISON:  Chest [place]  dated 01/06/2024. FINDINGS: Right-sided dialysis catheter with tip over the right atrium. Cardiomegaly with vascular congestion. No focal consolidation, effusion or pneumothorax. No acute osseous pathology. IMPRESSION: Cardiomegaly with vascular congestion. No focal consolidation. Electronically Signed   By: Vanetta Chou M.D.   On: 03/01/2024 17:32    Pertinent labs & imaging results that were available during my care of the patient were reviewed by me and considered in my medical decision making (see MDM for details).  Medications Ordered in ED Medications  guaiFENesin -codeine  100-10 MG/5ML solution 5 mL (5 mLs Oral Given 03/01/24 1923)                                                                                                                                     Procedures Procedures  (including critical care time)  Medical Decision Making / ED Course    Medical Decision Making:    [name] [name] is a 43 y.o. male with past medical history as below, significant for ESRD on HD, aplastic anemia, CVA, lupus anticoagulant who presents to the ED with complaint of arm pain, COVID. The complaint involves an extensive differential diagnosis and also carries with it a high risk of complications and morbidity.  Serious etiology was considered. Ddx includes but is not limited to: viral syndrome, covid 19, post op complication/pain/bleeding, etc  Complete initial physical exam performed, notably the patient was in NAD, no hypoxia.    Reviewed and confirmed nursing documentation for past medical history, family history, social history.  Vital signs reviewed.    RUE pain following vascular procedure > -  pain/swelling post operative - radial pulse intact, NVI RUE, sig swelling noted about surgical site w/ bruising - spoke w/ Dr Lanis, recommend ace wrap, f/u in the office next week for fistula gram   COVID 10 > - covid 19 positive on Friday - no hypoxia  here, he does have body aches - does not appear to require admission for covid at this time  ESRD on HD MWF > - missed HD Friday, last session Wednesday, is scheduled for HD tomorrow am - does not appear to require urgent HD tonight, will have him f/u with his HD tomorrow      8:33 PM:  I have discussed the diagnosis/risks/treatment options with the patient.  Evaluation and diagnostic testing in the emergency department does not suggest an emergent condition requiring admission or immediate intervention beyond what has been performed at this time.  They will follow up with PCP, vascular, HD clinic. We also discussed returning to the ED immediately if new or worsening sx occur. We discussed the sx which are most concerning (e.g., sudden worsening pain, fever, inability to tolerate by mouth . ) that necessitate immediate return.    The patient appears reasonably screen and/or stabilized for discharge and I doubt any other medical condition or other [facility] requiring further screening, evaluation, or treatment in the ED at this time prior to discharge.                 Additional history obtained: -Additional history obtained from na -External records from outside source obtained and reviewed including: Chart review including previous notes, labs, imaging, consultation notes including  Prior admit Prior surgical procedures Prior labs   Lab Tests: -I ordered, reviewed, and interpreted labs.   The pertinent results include:   Labs Reviewed  CBC WITH DIFFERENTIAL/PLATELET - Abnormal; Notable for the following components:      Result Value   RBC 2.53 (*)    Hemoglobin 8.8 (*)    HCT 27.4 (*)    MCV 108.3 (*)    MCH 34.8 (*)    Platelets 72 (*)    Monocytes Absolute 1.6 (*)    All other components within normal limits  BASIC METABOLIC PANEL WITH GFR - Abnormal; Notable for the following components:   Sodium 133 (*)    Chloride 95 (*)    BUN 62 (*)    Creatinine, Ser 17.42  (*)    Calcium  8.3 (*)    GFR, Estimated 3 (*)    All other components within normal limits    Notable for labs stable  EKG   EKG Interpretation Date/Time:    Ventricular Rate:    PR Interval:    QRS Duration:    QT Interval:    QTC Calculation:   R Axis:      Text Interpretation:           Imaging Studies ordered: I ordered imaging studies including cxr I independently visualized the following imaging with scope of interpretation limited to determining acute life threatening conditions related to emergency care; findings noted above I agree with the radiologist interpretation If any imaging was obtained with contrast I closely monitored patient for any possible adverse reaction a/w contrast administration in the emergency department   Medicines ordered and prescription drug management: Meds ordered this encounter  Medications   guaiFENesin -codeine  100-10 MG/5ML solution 5 mL    -I have reviewed the patients home medicines and have made adjustments as needed   Consultations Obtained: I requested consultation  with the dr lanis vascular surg,  and discussed lab and imaging findings as well as pertinent plan - they recommend: f/u office, ace wrap   Cardiac Monitoring: The patient was maintained on a cardiac monitor.  I personally viewed and interpreted the cardiac monitored which showed an underlying rhythm of: nsr Continuous pulse oximetry interpreted by myself, 99% on RA.    Social Determinants of Health:  Diagnosis or treatment significantly limited by social determinants of health: former smoker   Reevaluation: After the interventions noted above, I reevaluated the patient and found that they have improved  Co morbidities that complicate the patient evaluation  Past Medical History:  Diagnosis Date   ESRD on hemodialysis (HCC)    Hypertension    Lupus    Seizures (HCC)    Seizures (HCC)    Stroke (HCC)    68 or 43 years of age.     Wears glasses        Dispostion: Disposition decision including need for hospitalization was considered, and patient discharged from emergency department.    Final Clinical Impression(s) / ED Diagnoses Final diagnoses:  COVID-19  Post-operative pain  Arm swelling        Elnor Jayson LABOR, DO 03/01/24 2033

## 2024-03-01 NOTE — ED Notes (Signed)
 Pt nasal 02 on his upper lip  replaced sats  98% now

## 2024-03-01 NOTE — ED Notes (Signed)
 Cough med just arrived from pharmacy  pt sleeping

## 2024-03-01 NOTE — ED Notes (Signed)
 Medication cough  requested from pharmacy

## 2024-03-01 NOTE — ED Notes (Signed)
 Pt asked for a cough med  dr gray aware

## 2024-03-01 NOTE — ED Triage Notes (Signed)
 PT here via [facility] from [facility] facility. PT had new dialysis graft placed to R arm on Thursday.  Missed dialysis on Fri d/t argument with dialysis nurse (Last dialysis Wed).  Now c/o pain and swelling to R arm.    R arm swollen, bruised, warm and painful on palpation.  Radial pulse present.  Pt also tested positive for Covid on Friday.

## 2024-03-01 NOTE — ED Notes (Signed)
 The pts rt arm  still swollen  no pulse felt  too much swelling

## 2024-03-01 NOTE — Discharge Instructions (Addendum)
 It was a pleasure caring for you today in the emergency department.  Expect to hear from vascular surgery in the next couple days for office appointment. In the meantime please apply ace wrap to your right arm and keep it elevated when possible  Please follow up with your regularly scheduled hemodialysis tomorrow   Please return to the emergency department for any worsening or worrisome symptoms.

## 2024-03-01 NOTE — ED Notes (Signed)
 Patient called out and alerted nurse that he accidentally removed IV. IV catheter intact, site clean, dry, intact, bandaged.

## 2024-03-02 ENCOUNTER — Telehealth: Payer: Self-pay | Admitting: Vascular Surgery

## 2024-03-06 ENCOUNTER — Emergency Department (HOSPITAL_COMMUNITY)

## 2024-03-06 ENCOUNTER — Emergency Department (EMERGENCY_DEPARTMENT_HOSPITAL)

## 2024-03-06 ENCOUNTER — Other Ambulatory Visit: Payer: Self-pay

## 2024-03-06 ENCOUNTER — Other Ambulatory Visit (HOSPITAL_COMMUNITY): Payer: Self-pay

## 2024-03-06 ENCOUNTER — Emergency Department (HOSPITAL_COMMUNITY)
Admission: EM | Admit: 2024-03-06 | Discharge: 2024-03-06 | Disposition: A | Discharge status: Home / Self Care | Source: Home / Self Care | Attending: Emergency Medicine | Admitting: Emergency Medicine

## 2024-03-06 DIAGNOSIS — U071 COVID-19: Secondary | ICD-10-CM | POA: Insufficient documentation

## 2024-03-06 DIAGNOSIS — Z8673 Personal history of transient ischemic attack (TIA), and cerebral infarction without residual deficits: Secondary | ICD-10-CM | POA: Insufficient documentation

## 2024-03-06 DIAGNOSIS — I120 Hypertensive chronic kidney disease with stage 5 chronic kidney disease or end stage renal disease: Secondary | ICD-10-CM | POA: Insufficient documentation

## 2024-03-06 DIAGNOSIS — N186 End stage renal disease: Secondary | ICD-10-CM | POA: Insufficient documentation

## 2024-03-06 DIAGNOSIS — T82510D Breakdown (mechanical) of surgically created arteriovenous fistula, subsequent encounter: Secondary | ICD-10-CM

## 2024-03-06 DIAGNOSIS — R072 Precordial pain: Secondary | ICD-10-CM | POA: Diagnosis not present

## 2024-03-06 DIAGNOSIS — Z7982 Long term (current) use of aspirin: Secondary | ICD-10-CM | POA: Insufficient documentation

## 2024-03-06 DIAGNOSIS — Z7901 Long term (current) use of anticoagulants: Secondary | ICD-10-CM | POA: Insufficient documentation

## 2024-03-06 DIAGNOSIS — M7989 Other specified soft tissue disorders: Secondary | ICD-10-CM

## 2024-03-06 DIAGNOSIS — R600 Localized edema: Secondary | ICD-10-CM | POA: Insufficient documentation

## 2024-03-06 DIAGNOSIS — Z992 Dependence on renal dialysis: Secondary | ICD-10-CM | POA: Insufficient documentation

## 2024-03-06 DIAGNOSIS — Z79899 Other long term (current) drug therapy: Secondary | ICD-10-CM | POA: Insufficient documentation

## 2024-03-06 DIAGNOSIS — T8140XA Infection following a procedure, unspecified, initial encounter: Secondary | ICD-10-CM | POA: Diagnosis not present

## 2024-03-06 LAB — COMPREHENSIVE METABOLIC PANEL WITH GFR
ALT: 6 U/L (ref 0–44)
AST: 17 U/L (ref 15–41)
Albumin: 2.4 g/dL — ABNORMAL LOW (ref 3.5–5.0)
Alkaline Phosphatase: 52 U/L (ref 38–126)
Anion gap: 16 — ABNORMAL HIGH (ref 5–15)
BUN: 78 mg/dL — ABNORMAL HIGH (ref 6–20)
CO2: 26 mmol/L (ref 22–32)
Calcium: 8.8 mg/dL — ABNORMAL LOW (ref 8.9–10.3)
Chloride: 94 mmol/L — ABNORMAL LOW (ref 98–111)
Creatinine, Ser: 20.23 mg/dL — ABNORMAL HIGH (ref 0.61–1.24)
GFR, Estimated: 3 mL/min — ABNORMAL LOW (ref >60)
Glucose, Bld: 73 mg/dL (ref 70–99)
Potassium: 4.8 mmol/L (ref 3.5–5.1)
Sodium: 136 mmol/L (ref 135–145)
Total Bilirubin: 0.5 mg/dL (ref 0.0–1.2)
Total Protein: 5.9 g/dL — ABNORMAL LOW (ref 6.5–8.1)

## 2024-03-06 LAB — RESP PANEL BY RT-PCR (RSV, FLU A&B, COVID)  RVPGX2
Influenza A by PCR: NEGATIVE
Influenza B by PCR: NEGATIVE
Resp Syncytial Virus by PCR: NEGATIVE
SARS Coronavirus 2 by RT PCR: POSITIVE — AB

## 2024-03-06 LAB — CBC WITH DIFFERENTIAL/PLATELET
Abs Immature Granulocytes: 0.03 K/uL (ref 0.00–0.07)
Basophils Absolute: 0 K/uL (ref 0.0–0.1)
Basophils Relative: 0 %
Eosinophils Absolute: 0.2 K/uL (ref 0.0–0.5)
Eosinophils Relative: 3 %
HCT: 28.4 % — ABNORMAL LOW (ref 39.0–52.0)
Hemoglobin: 9 g/dL — ABNORMAL LOW (ref 13.0–17.0)
Immature Granulocytes: 0 %
Lymphocytes Relative: 16 %
Lymphs Abs: 1.2 K/uL (ref 0.7–4.0)
MCH: 34.4 pg — ABNORMAL HIGH (ref 26.0–34.0)
MCHC: 31.7 g/dL (ref 30.0–36.0)
MCV: 108.4 fL — ABNORMAL HIGH (ref 80.0–100.0)
Monocytes Absolute: 1 K/uL (ref 0.1–1.0)
Monocytes Relative: 13 %
Neutro Abs: 5.1 K/uL (ref 1.7–7.7)
Neutrophils Relative %: 68 %
Platelets: 145 K/uL — ABNORMAL LOW (ref 150–400)
RBC: 2.62 MIL/uL — ABNORMAL LOW (ref 4.22–5.81)
RDW: 14.2 % (ref 11.5–15.5)
WBC: 7.6 K/uL (ref 4.0–10.5)
nRBC: 0 % (ref 0.0–0.2)

## 2024-03-06 LAB — TROPONIN I (HIGH SENSITIVITY)
Troponin I (High Sensitivity): 95 ng/L — ABNORMAL HIGH (ref <18)
Troponin I (High Sensitivity): 93 ng/L — ABNORMAL HIGH (ref <18)

## 2024-03-06 MED ORDER — CEPHALEXIN 500 MG PO CAPS
500.0000 mg | ORAL_CAPSULE | ORAL | 0 refills | Status: DC
  Filled 2024-03-06: qty 10, 5d supply, fill #0

## 2024-03-06 NOTE — Care Management (Signed)
 The patient has his wheelchair here, and he is going back to [facility] via [place]. [facility] called to see if they could take the wheelchair to [facility]. They will pick it up and take it there. They prefer that it be at hte front entrance if possible. Nursing and CSW made aware

## 2024-03-06 NOTE — ED Triage Notes (Signed)
 Pt. BIB [facility] from [facility] with c/o of SOB, non-productive cough, and R arm swelling; Pt. Has dialysis on M, W, and F; but has missed the last two; Pt's R arm swelling is located on the same arm as fistula; Per [facility], pt. Had COVID a week ago as well.   Vitals per [facility]: BP: 133/70 HR: 77 CBG: 76 - pt. Has not ate today SpO2: 95% RA

## 2024-03-06 NOTE — Discharge Instructions (Addendum)
 You were evaluated in the emergency room for arm swelling.  There was no evidence of a blood clot.  Your fistula appeared to be working well.  Prescription for antibiotics was sent to your pharmacy to cover for possible infection.  Please complete the full course of antibiotics.  Please call your surgeon today for early follow-up.  Please keep your arm elevated and use the Ace bandage as instructed.  Please return if you experience any new or worsening symptoms.

## 2024-03-06 NOTE — ED Provider Notes (Signed)
 [place] EMERGENCY DEPARTMENT AT [facility] Provider Note   CSN: [id number] Arrival date & time: 03/06/24  9063     Patient presents with: Shortness of Breath   [name] [name] is a 43 y.o. male with history of ESRD on HD (MWF), hypertension, lupus, CVA presents with complaints of shortness of breath and right arm swelling and pain.  Patient is status post right upper extremity basilic vein transposition on 9/9 with right upper extremity AV fistula placement 7/8 with Dr. Gari.  Patient reports that over the past week he has been having shortness of breath with exertion and worsening swelling in his arm.  No chest pain.  No cough or URI symptoms.  Although it does appear that he tested positive for COVID last week.  Reports compliance with his Eliquis .  Reports that it has been at least a week since his last dialysis session.    Shortness of Breath  Past Medical History:  Diagnosis Date   ESRD on hemodialysis (HCC)    Hypertension    Lupus    Seizures (HCC)    Seizures (HCC)    Stroke (HCC)    40 or 43 years of age.     Wears glasses        Prior to Admission medications   Medication Sig Start Date End Date Taking? Authorizing Provider  acetaminophen  (TYLENOL ) 325 MG tablet Take 2 tablets (650 mg total) by mouth every 6 (six) hours as needed for mild pain. 02/13/23  Yes Danton Reyes DASEN, MD  apixaban  (ELIQUIS ) 5 MG TABS tablet Take 1 tablet (5 mg total) by mouth 2 (two) times daily. 11/12/23  Yes Gonfa, Taye T, MD  aspirin  81 MG chewable tablet Chew 1 tablet (81 mg total) by mouth daily. Patient taking differently: Chew 81 mg by mouth daily in the afternoon. 01/13/24  Yes Shitarev, Dimitry, MD  atorvastatin  (LIPITOR) 40 MG tablet Take 1 tablet (40 mg total) by mouth daily. Patient taking differently: Take 40 mg by mouth every evening. 08/03/23  Yes Darci Pore, MD  AURYXIA  1 GM 210 MG(Fe) tablet Take 840 mg by mouth See admin instructions. Take 840 mg by  mouth every evening on Monday, Wednesday, and Friday. Take 840 mg by mouth three times daily on Tuesday, Thursday, Saturday, and Sunday. 10/07/23  Yes [provider]  cyclobenzaprine  (FLEXERIL ) 10 MG tablet Take 1 tablet (10 mg total) by mouth 2 (two) times daily as needed for muscle spasms. 03/01/24  Yes Elnor Savant A, DO  diphenhydrAMINE  (BENADRYL ) 25 mg capsule Take 25 mg by mouth once.   Yes [provider]  divalproex  (DEPAKOTE ) 500 MG DR tablet Take 2 tablets (1,000 mg total) by mouth every 12 (twelve) hours. 11/12/23  Yes Gonfa, Taye T, MD  guaiFENesin -dextromethorphan (ROBITUSSIN DM) 100-10 MG/5ML syrup Take 5 mLs by mouth every 4 (four) hours as needed for cough. Patient taking differently: Take 5 mLs by mouth every 6 (six) hours as needed for cough. 03/01/24  Yes Elnor Savant A, DO  imiquimod  (ALDARA ) 5 % cream Apply to affected area three times weekly for 8 weeks. Patient taking differently: Apply 1 Application topically every Tuesday, Thursday, and Saturday at 6 PM. Apply to perianal area 01/13/24 01/12/25 Yes Shitarev, Dimitry, MD  levETIRAcetam  (KEPPRA ) 500 MG tablet Take 1 tablet (500 mg total) by mouth 2 (two) times daily. 01/12/24  Yes Alba Sharper, MD  losartan  (COZAAR ) 50 MG tablet Take 50 mg by mouth daily.   Yes [provider]  metoprolol  succinate (TOPROL -XL) 25 MG 24 hr tablet Take 1 tablet (25 mg total) by mouth daily. Patient taking differently: Take 25 mg by mouth daily in the afternoon. 01/13/24  Yes Shitarev, Dimitry, MD  Multiple Vitamins-Minerals (MULTIVITAMIN WITH MINERALS) tablet Take 1 tablet by mouth daily.   Yes [provider]  nitroGLYCERIN  (NITROSTAT ) 0.4 MG SL tablet Place 0.4 mg under the tongue every 5 (five) minutes as needed for chest pain.   Yes [provider]  cephALEXin  (KEFLEX ) 500 MG capsule Take 1 capsule (500 mg total) by mouth every 8 (eight) hours. 03/06/24   Donnajean Lynwood DEL, PA-C  molnupiravir EUA  (LAGEVRIO) 200 MG CAPS capsule Take 4 capsules by mouth 2 (two) times daily. Patient not taking: Reported on 03/06/2024    [provider]    Allergies: Cetirizine & related and Hibiclens  [chlorhexidine  gluconate]    Review of Systems  Respiratory:  Positive for shortness of breath.     Updated Vital Signs BP (!) 144/96   Pulse 80   Temp 98.2 F (36.8 C) (Oral)   Resp 17   Ht 6' (1.829 m)   Wt 108.9 kg   SpO2 98%   BMI 32.55 kg/m   Physical Exam Vitals and nursing note reviewed.  Constitutional:      General: He is not in acute distress.    Appearance: He is well-developed.  HENT:     Head: Normocephalic and atraumatic.  Eyes:     Conjunctiva/sclera: Conjunctivae normal.  Cardiovascular:     Rate and Rhythm: Normal rate and regular rhythm.     Heart sounds: No murmur heard. Pulmonary:     Effort: Pulmonary effort is normal. No respiratory distress.     Breath sounds: Normal breath sounds.  Abdominal:     Palpations: Abdomen is soft.     Tenderness: There is no abdominal tenderness.  Musculoskeletal:        General: Swelling present.     Cervical back: Neck supple.     Comments: 2+ right upper extremity pitting edema, fistula site with palpable thrill, mild overlying erythema and warmth, incision sites without drainage, radial pulses 2+  2+ bilateral lower extremity pitting edema, DP/PT pulses 2+  Skin:    General: Skin is warm and dry.     Capillary Refill: Capillary refill takes less than 2 seconds.  Neurological:     Mental Status: He is alert.  Psychiatric:        Mood and Affect: Mood normal.     (all labs ordered are listed, but only abnormal results are displayed) Labs Reviewed  RESP PANEL BY RT-PCR (RSV, FLU A&B, COVID)  RVPGX2 - Abnormal; Notable for the following components:      Result Value   SARS Coronavirus 2 by RT PCR POSITIVE (*)    All other components within normal limits  CBC WITH DIFFERENTIAL/PLATELET - Abnormal; Notable for the  following components:   RBC 2.62 (*)    Hemoglobin 9.0 (*)    HCT 28.4 (*)    MCV 108.4 (*)    MCH 34.4 (*)    Platelets 145 (*)    All other components within normal limits  COMPREHENSIVE METABOLIC PANEL WITH GFR - Abnormal; Notable for the following components:   Chloride 94 (*)    BUN 78 (*)    Creatinine, Ser 20.23 (*)    Calcium  8.8 (*)    Total Protein 5.9 (*)    Albumin  2.4 (*)  GFR, Estimated 3 (*)    Anion gap 16 (*)    All other components within normal limits  TROPONIN I (HIGH SENSITIVITY) - Abnormal; Notable for the following components:   Troponin I (High Sensitivity) 95 (*)    All other components within normal limits  TROPONIN I (HIGH SENSITIVITY) - Abnormal; Notable for the following components:   Troponin I (High Sensitivity) 93 (*)    All other components within normal limits    EKG: None  Radiology: VAS US  DUPLEX DIALYSIS ACCESS (AVF, AVG) Result Date: 03/06/2024 DIALYSIS ACCESS Patient Name:  [name] [name]  Date of Exam:   03/06/2024 Medical Rec #: [id number]       Accession #:    [id number] Date of Birth: [birth year]      Patient Gender: M Patient Age:   46 years Exam Location:  [name] Procedure:      VAS US  DUPLEX DIALYSIS ACCESS (AVF, AVG) Referring Phys: LYNWOOD Alyx Gee --------------------------------------------------------------------------------  Reason for Exam: Mechanical complication AVF, s/p AV revision, eval for                  thrombosis. Access Site: Right Upper Extremity. Access Type: Basilic vein transposition. Comparison Study: Previous study on 8.27.2025. Performing Technologist: Edilia Elden Appl  Examination Guidelines: A complete evaluation includes B-mode imaging, spectral Doppler, color Doppler, and power Doppler as needed of all accessible portions of each vessel. Unilateral testing is considered an integral part of a complete examination. Limited examinations for reoccurring indications may be performed as noted.  Findings:  +--------------------+----------+-----------------+--------+ AVF                 PSV (cm/s)Flow Vol (mL/min)Comments +--------------------+----------+-----------------+--------+ Native artery inflow   173          1362                +--------------------+----------+-----------------+--------+ AVF Anastomosis        602                              +--------------------+----------+-----------------+--------+  +------------+----------+-------------+----------+--------+ OUTFLOW VEINPSV (cm/s)Diameter (cm)Depth (cm)Describe +------------+----------+-------------+----------+--------+ Confluence     160                                    +------------+----------+-------------+----------+--------+ Prox UA        279                                    +------------+----------+-------------+----------+--------+ Mid UA         163                                    +------------+----------+-------------+----------+--------+ Dist UA        543                                    +------------+----------+-------------+----------+--------+ Excessive swelling noted and complex structure measuring 6.9 x 2.6 x 6.5 cm in the medial proximal upper arm.   Summary: Patent arteriovenous fistula. *See table(s) above for measurements and observations.      --------------------------------------------------------------------------------   Preliminary    DG Chest 2 View  Result Date: 03/06/2024 CLINICAL DATA:  Shortness of breath, nonproductive cough. EXAM: CHEST - 2 VIEW COMPARISON:  March 01, 2024. FINDINGS: Stable cardiomegaly with central pulmonary vascular congestion. Minimal pulmonary edema may be present. Right internal jugular dialysis catheter is unchanged. Bony thorax is unremarkable. IMPRESSION: Stable cardiomegaly with central pulmonary vascular congestion. Minimal pulmonary edema may be present. Electronically Signed   By: Lynwood Landy Raddle M.D.   On: 03/06/2024 10:33      Ultrasound ED Thoracic  Date/Time: 03/06/2024 10:18 AM  Performed by: Donnajean Lynwood DEL, PA-C Authorized by: Donnajean Lynwood DEL, PA-C   Procedure details:    Indications: dyspnea     Assessment for:  Interstitial syndrome   Left lung pleural:  Visualized   Right lung pleural:  Visualized   Limitations:  Body habitus Findings:    B-lines noted throughout: not identified   Impression:    Impression: none   .Ultrasound ED Peripheral IV (Provider)  Date/Time: 03/06/2024 10:43 AM  Performed by: Donnajean Lynwood DEL, PA-C Authorized by: Donnajean Lynwood DEL, PA-C   Procedure details:    Indications: poor IV access     Skin Prep: chlorhexidine  gluconate     Location:  Left AC   Angiocath:  20 G   Bedside Ultrasound Guided: Yes     Patient tolerated procedure without complications: Yes     Dressing applied: Yes   Comments:     Observed by Dr. Pamella    Medications Ordered in the ED - No data to display  Clinical Course as of 03/06/24 1541  Fri Mar 06, 2024  1014 Patient with ESRD on HD status post revision for AV fistula evaluated for complaints of right upper extremity swelling, pain and shortness of breath.  He is hemodynamically stable.  On exam he has 2+ right upper extremity pitting edema, fistula with palpable thrill.  Has bilateral lower extremity 2+ pitting edema.  Lung sounds are clear.  Will obtain EKG, routine labs, chest x-ray and duplex to evaluate for thrombosis/DVT. [JT]  1017 EKG 12-Lead Sinus rhythm without T wave abnormalities [JT]  1046 Resp panel by RT-PCR (RSV, Flu A&B, Covid) Anterior Nasal Swab(!) COVID-positive [JT]  1056 DG Chest 2 View Stable cardiomegaly with very minimal central pulmonary congestion [JT]  1101 CBC with Differential(!) No leukocytosis, hemoglobin stable [JT]  1109 Chart review demonstrates visit on 9/14 for similar complaints.  Dr. Silver with vascular surgery was consulted recommended applying Ace bandage to the right upper  extremity and following up outpatient for fistulogram.  Does not appear that patient has followed up outpatient. [JT]  1201 Comprehensive metabolic panel(!) Creatinine of 20.23 in the setting of ESRD, potassium 4.8, anion gap was 16 [JT]  1201 Troponin I (High Sensitivity)(!) Elevated to 95 [JT]  1336 Troponin I (High Sensitivity)(!) Delta 93 [JT]  1518 Patient reevaluated, reports that he does not feel short of breath at this time.  Ultrasound without any evidence of DVT.  Patient instructed on elevating his arm, applying compression wrap, he will contact his surgeon while for close follow-up.  Due to mild overlying erythema and warmth following surgery, will discharge home on course of Keflex  to cover for possible cellulitis provided strict return precautions including chest pain, worsening shortness of breath.  Encouraged to attend dialysis on Monday.  Patient is understanding agreement with plan. [JT]    Clinical Course User Index [JT] Donnajean Lynwood DEL, PA-C  Medical Decision Making Amount and/or Complexity of Data Reviewed Labs: ordered. Decision-making details documented in ED Course. Radiology: ordered. Decision-making details documented in ED Course. ECG/medicine tests:  Decision-making details documented in ED Course.  Risk Prescription drug management.   This patient presents to the ED with chief complaint(s) of arm pain .  The complaint involves an extensive differential diagnosis and also carries with it a high risk of complications and morbidity.   Pertinent past medical history as listed in HPI  The differential diagnosis includes  Ultrasound without evidence of DVT or fistula thrombosis or stenosis.  Do not suspect PE.  Lung sounds are clear.  Do not suspect pneumonia.  Does not appear to urgently require dialysis.  Troponins are at baseline he is without any chest pain or EKG changes.  Do not suspect ACS Additional history  obtained: Records reviewed Care Everywhere/External Records  Disposition:   Patient will be discharged home. The patient has been appropriately medically screened and/or stabilized in the ED. I have low suspicion for any other emergent medical condition which would require further screening, evaluation or treatment in the ED or require inpatient management. At time of discharge the patient is hemodynamically stable and in no acute distress. I have discussed work-up results and diagnosis with patient and answered all questions. Patient is agreeable with discharge plan. We discussed strict return precautions for returning to the emergency department and they verbalized understanding.     Social Determinants of Health:   none  This note was dictated with voice recognition software.  Despite best efforts at proofreading, errors may have occurred which can change the documentation meaning.       Final diagnoses:  Arm swelling    ED Discharge Orders          Ordered    cephALEXin  (KEFLEX ) 500 MG capsule  Every 8 hours        03/06/24 1538               Donnajean Lynwood DEL, PA-C 03/06/24 1541    Pamella Sharper A, DO 03/09/24 1040

## 2024-03-06 NOTE — ED Notes (Signed)
 Pt. In bed; VSS; no complaints at this time

## 2024-03-06 NOTE — ED Notes (Signed)
 PA at [facility].

## 2024-03-07 ENCOUNTER — Encounter (HOSPITAL_COMMUNITY): Payer: Self-pay | Admitting: Emergency Medicine

## 2024-03-07 ENCOUNTER — Other Ambulatory Visit: Payer: Self-pay

## 2024-03-07 ENCOUNTER — Emergency Department (HOSPITAL_COMMUNITY)

## 2024-03-07 ENCOUNTER — Inpatient Hospital Stay (HOSPITAL_COMMUNITY)
Admission: EM | Admit: 2024-03-07 | Discharge: 2024-03-13 | DRG: 862 | Disposition: A | Discharge status: Skilled Nursing Facility | Attending: Infectious Diseases | Admitting: Infectious Diseases

## 2024-03-07 DIAGNOSIS — Z833 Family history of diabetes mellitus: Secondary | ICD-10-CM

## 2024-03-07 DIAGNOSIS — N186 End stage renal disease: Secondary | ICD-10-CM

## 2024-03-07 DIAGNOSIS — G40909 Epilepsy, unspecified, not intractable, without status epilepticus: Secondary | ICD-10-CM | POA: Diagnosis present

## 2024-03-07 DIAGNOSIS — F17290 Nicotine dependence, other tobacco product, uncomplicated: Secondary | ICD-10-CM | POA: Diagnosis present

## 2024-03-07 DIAGNOSIS — Z6832 Body mass index (BMI) 32.0-32.9, adult: Secondary | ICD-10-CM

## 2024-03-07 DIAGNOSIS — D6861 Antiphospholipid syndrome: Secondary | ICD-10-CM | POA: Diagnosis present

## 2024-03-07 DIAGNOSIS — Z8673 Personal history of transient ischemic attack (TIA), and cerebral infarction without residual deficits: Secondary | ICD-10-CM

## 2024-03-07 DIAGNOSIS — Z9981 Dependence on supplemental oxygen: Secondary | ICD-10-CM

## 2024-03-07 DIAGNOSIS — L03113 Cellulitis of right upper limb: Principal | ICD-10-CM | POA: Diagnosis present

## 2024-03-07 DIAGNOSIS — Z7901 Long term (current) use of anticoagulants: Secondary | ICD-10-CM

## 2024-03-07 DIAGNOSIS — D539 Nutritional anemia, unspecified: Secondary | ICD-10-CM | POA: Diagnosis present

## 2024-03-07 DIAGNOSIS — Z881 Allergy status to other antibiotic agents status: Secondary | ICD-10-CM

## 2024-03-07 DIAGNOSIS — R079 Chest pain, unspecified: Secondary | ICD-10-CM | POA: Diagnosis present

## 2024-03-07 DIAGNOSIS — E875 Hyperkalemia: Secondary | ICD-10-CM | POA: Diagnosis present

## 2024-03-07 DIAGNOSIS — Z79899 Other long term (current) drug therapy: Secondary | ICD-10-CM

## 2024-03-07 DIAGNOSIS — Z7982 Long term (current) use of aspirin: Secondary | ICD-10-CM

## 2024-03-07 DIAGNOSIS — Z789 Other specified health status: Secondary | ICD-10-CM

## 2024-03-07 DIAGNOSIS — D631 Anemia in chronic kidney disease: Secondary | ICD-10-CM | POA: Diagnosis present

## 2024-03-07 DIAGNOSIS — I770 Arteriovenous fistula, acquired: Secondary | ICD-10-CM | POA: Diagnosis present

## 2024-03-07 DIAGNOSIS — T8140XA Infection following a procedure, unspecified, initial encounter: Principal | ICD-10-CM | POA: Diagnosis present

## 2024-03-07 DIAGNOSIS — I1311 Hypertensive heart and chronic kidney disease without heart failure, with stage 5 chronic kidney disease, or end stage renal disease: Secondary | ICD-10-CM | POA: Diagnosis present

## 2024-03-07 DIAGNOSIS — I10 Essential (primary) hypertension: Secondary | ICD-10-CM | POA: Diagnosis present

## 2024-03-07 DIAGNOSIS — I953 Hypotension of hemodialysis: Secondary | ICD-10-CM | POA: Diagnosis not present

## 2024-03-07 DIAGNOSIS — I69398 Other sequelae of cerebral infarction: Secondary | ICD-10-CM

## 2024-03-07 DIAGNOSIS — Z888 Allergy status to other drugs, medicaments and biological substances status: Secondary | ICD-10-CM

## 2024-03-07 DIAGNOSIS — M7989 Other specified soft tissue disorders: Secondary | ICD-10-CM | POA: Diagnosis present

## 2024-03-07 DIAGNOSIS — M79601 Pain in right arm: Secondary | ICD-10-CM | POA: Diagnosis present

## 2024-03-07 DIAGNOSIS — E669 Obesity, unspecified: Secondary | ICD-10-CM | POA: Diagnosis present

## 2024-03-07 DIAGNOSIS — D6859 Other primary thrombophilia: Secondary | ICD-10-CM | POA: Diagnosis present

## 2024-03-07 DIAGNOSIS — J9611 Chronic respiratory failure with hypoxia: Secondary | ICD-10-CM | POA: Diagnosis present

## 2024-03-07 DIAGNOSIS — E8779 Other fluid overload: Secondary | ICD-10-CM

## 2024-03-07 DIAGNOSIS — M329 Systemic lupus erythematosus, unspecified: Secondary | ICD-10-CM | POA: Diagnosis present

## 2024-03-07 DIAGNOSIS — Z992 Dependence on renal dialysis: Secondary | ICD-10-CM

## 2024-03-07 DIAGNOSIS — U071 COVID-19: Secondary | ICD-10-CM | POA: Diagnosis present

## 2024-03-07 DIAGNOSIS — R072 Precordial pain: Principal | ICD-10-CM | POA: Diagnosis present

## 2024-03-07 NOTE — ED Triage Notes (Signed)
 BIB [facility] from [facility] for chest pain that is centralized, non-radiating approx 1 hour and 15 min ago. Given 2 SL nitroglycerin  PTA of EMS (no relief) and also mylanta with minor relief. 324 mg aspirin . MWF dialysis. Missed Friday r/t Covid dx.   BP 154/90 HR 80s Resp 20 Spo2 95-96% on 3 LPM via Media, no baseline was placed at facility.

## 2024-03-07 NOTE — ED Notes (Signed)
 Pt taken to xray

## 2024-03-08 DIAGNOSIS — R079 Chest pain, unspecified: Secondary | ICD-10-CM | POA: Diagnosis present

## 2024-03-08 DIAGNOSIS — Z992 Dependence on renal dialysis: Secondary | ICD-10-CM

## 2024-03-08 DIAGNOSIS — U071 COVID-19: Secondary | ICD-10-CM

## 2024-03-08 DIAGNOSIS — M79601 Pain in right arm: Secondary | ICD-10-CM | POA: Diagnosis present

## 2024-03-08 DIAGNOSIS — D6859 Other primary thrombophilia: Secondary | ICD-10-CM | POA: Diagnosis present

## 2024-03-08 DIAGNOSIS — Z79899 Other long term (current) drug therapy: Secondary | ICD-10-CM

## 2024-03-08 DIAGNOSIS — R600 Localized edema: Secondary | ICD-10-CM

## 2024-03-08 DIAGNOSIS — J9611 Chronic respiratory failure with hypoxia: Secondary | ICD-10-CM

## 2024-03-08 DIAGNOSIS — N186 End stage renal disease: Secondary | ICD-10-CM | POA: Diagnosis not present

## 2024-03-08 DIAGNOSIS — I120 Hypertensive chronic kidney disease with stage 5 chronic kidney disease or end stage renal disease: Secondary | ICD-10-CM | POA: Diagnosis not present

## 2024-03-08 DIAGNOSIS — F17290 Nicotine dependence, other tobacco product, uncomplicated: Secondary | ICD-10-CM

## 2024-03-08 DIAGNOSIS — I770 Arteriovenous fistula, acquired: Secondary | ICD-10-CM | POA: Diagnosis present

## 2024-03-08 DIAGNOSIS — Z7901 Long term (current) use of anticoagulants: Secondary | ICD-10-CM

## 2024-03-08 DIAGNOSIS — Z9981 Dependence on supplemental oxygen: Secondary | ICD-10-CM

## 2024-03-08 LAB — HEPATITIS B SURFACE ANTIGEN: Hepatitis B Surface Ag: NONREACTIVE

## 2024-03-08 LAB — BASIC METABOLIC PANEL WITH GFR
Anion gap: 23 — ABNORMAL HIGH (ref 5–15)
Anion gap: 20 — ABNORMAL HIGH (ref 5–15)
BUN: 99 mg/dL — ABNORMAL HIGH (ref 6–20)
BUN: 106 mg/dL — ABNORMAL HIGH (ref 6–20)
CO2: 21 mmol/L — ABNORMAL LOW (ref 22–32)
CO2: 23 mmol/L (ref 22–32)
Calcium: 8.9 mg/dL (ref 8.9–10.3)
Calcium: 8.7 mg/dL — ABNORMAL LOW (ref 8.9–10.3)
Chloride: 92 mmol/L — ABNORMAL LOW (ref 98–111)
Chloride: 93 mmol/L — ABNORMAL LOW (ref 98–111)
Creatinine, Ser: 22.63 mg/dL — ABNORMAL HIGH (ref 0.61–1.24)
Creatinine, Ser: 24.54 mg/dL — ABNORMAL HIGH (ref 0.61–1.24)
GFR, Estimated: 2 mL/min — ABNORMAL LOW (ref >60)
GFR, Estimated: 2 mL/min — ABNORMAL LOW (ref >60)
Glucose, Bld: 90 mg/dL (ref 70–99)
Glucose, Bld: 67 mg/dL — ABNORMAL LOW (ref 70–99)
Potassium: 5.2 mmol/L — ABNORMAL HIGH (ref 3.5–5.1)
Potassium: 5.3 mmol/L — ABNORMAL HIGH (ref 3.5–5.1)
Sodium: 136 mmol/L (ref 135–145)
Sodium: 136 mmol/L (ref 135–145)

## 2024-03-08 LAB — CBC
HCT: 30.3 % — ABNORMAL LOW (ref 39.0–52.0)
Hemoglobin: 9.6 g/dL — ABNORMAL LOW (ref 13.0–17.0)
MCH: 34 pg (ref 26.0–34.0)
MCHC: 31.7 g/dL (ref 30.0–36.0)
MCV: 107.4 fL — ABNORMAL HIGH (ref 80.0–100.0)
Platelets: 166 K/uL (ref 150–400)
RBC: 2.82 MIL/uL — ABNORMAL LOW (ref 4.22–5.81)
RDW: 14 % (ref 11.5–15.5)
WBC: 8.1 K/uL (ref 4.0–10.5)
nRBC: 0 % (ref 0.0–0.2)

## 2024-03-08 LAB — VITAMIN B12: Vitamin B-12: 948 pg/mL — ABNORMAL HIGH (ref 180–914)

## 2024-03-08 LAB — TROPONIN I (HIGH SENSITIVITY)
Troponin I (High Sensitivity): 84 ng/L — ABNORMAL HIGH (ref <18)
Troponin I (High Sensitivity): 83 ng/L — ABNORMAL HIGH (ref <18)

## 2024-03-08 LAB — FOLATE: Folate: 14.1 ng/mL (ref >5.9)

## 2024-03-08 MED ORDER — HEPARIN SODIUM (PORCINE) 1000 UNIT/ML DIALYSIS
1000.0000 [IU] | INTRAMUSCULAR | Status: DC | PRN
  Administered 2024-03-10 – 2024-03-11 (×2): 3800 [IU]
  Filled 2024-03-08 (×2): qty 1

## 2024-03-08 MED ORDER — PENTAFLUOROPROP-TETRAFLUOROETH EX AERO: 1.0000 | INHALATION_SPRAY | CUTANEOUS | Status: DC | PRN

## 2024-03-08 MED ORDER — ACETAMINOPHEN 500 MG PO TABS
1000.0000 mg | ORAL_TABLET | Freq: Four times a day (QID) | ORAL | Status: DC
  Administered 2024-03-08 – 2024-03-13 (×16): 1000 mg via ORAL
  Filled 2024-03-08 (×15): qty 2

## 2024-03-08 MED ORDER — LIDOCAINE-PRILOCAINE 2.5-2.5 % EX CREA: 1.0000 | TOPICAL_CREAM | CUTANEOUS | Status: DC | PRN

## 2024-03-08 MED ORDER — METOPROLOL SUCCINATE ER 25 MG PO TB24
25.0000 mg | ORAL_TABLET | Freq: Every day | ORAL | Status: DC
Start: 2024-03-08 — End: 2024-03-11
  Administered 2024-03-08 – 2024-03-11 (×4): 25 mg via ORAL
  Filled 2024-03-08 (×4): qty 1

## 2024-03-08 MED ORDER — ASPIRIN 81 MG PO CHEW
81.0000 mg | CHEWABLE_TABLET | Freq: Every day | ORAL | Status: DC
  Administered 2024-03-08 – 2024-03-13 (×6): 81 mg via ORAL
  Filled 2024-03-08 (×6): qty 1

## 2024-03-08 MED ORDER — DARBEPOETIN ALFA 100 MCG/0.5ML IJ SOSY
100.0000 ug | PREFILLED_SYRINGE | INTRAMUSCULAR | Status: DC
  Administered 2024-03-11: 100 ug via SUBCUTANEOUS
  Filled 2024-03-08: qty 0.5

## 2024-03-08 MED ORDER — ATORVASTATIN CALCIUM 40 MG PO TABS
40.0000 mg | ORAL_TABLET | Freq: Every day | ORAL | Status: DC
  Administered 2024-03-08 – 2024-03-13 (×6): 40 mg via ORAL
  Filled 2024-03-08 (×6): qty 1

## 2024-03-08 MED ORDER — LIDOCAINE HCL (PF) 1 % IJ SOLN: 5.0000 mL | INTRAMUSCULAR | Status: DC | PRN

## 2024-03-08 MED ORDER — LEVETIRACETAM 500 MG PO TABS
500.0000 mg | ORAL_TABLET | Freq: Two times a day (BID) | ORAL | Status: DC
Start: 2024-03-08 — End: 2024-03-14
  Administered 2024-03-08 – 2024-03-13 (×11): 500 mg via ORAL
  Filled 2024-03-08 (×8): qty 1
  Filled 2024-03-08: qty 2
  Filled 2024-03-08 (×2): qty 1

## 2024-03-08 MED ORDER — APIXABAN 5 MG PO TABS
5.0000 mg | ORAL_TABLET | Freq: Two times a day (BID) | ORAL | Status: DC
  Administered 2024-03-08 – 2024-03-13 (×11): 5 mg via ORAL
  Filled 2024-03-08 (×8): qty 1
  Filled 2024-03-08: qty 2
  Filled 2024-03-08 (×2): qty 1

## 2024-03-08 MED ORDER — ALTEPLASE 2 MG IJ SOLR: 2.0000 mg | Freq: Once | INTRAMUSCULAR | Status: DC | PRN

## 2024-03-08 MED ORDER — HEPARIN SODIUM (PORCINE) 1000 UNIT/ML IJ SOLN
INTRAMUSCULAR | Status: AC
  Filled 2024-03-08: qty 1

## 2024-03-08 MED ORDER — CEPHALEXIN 500 MG PO CAPS
500.0000 mg | ORAL_CAPSULE | Freq: Two times a day (BID) | ORAL | Status: DC
  Administered 2024-03-08 – 2024-03-09 (×3): 500 mg via ORAL
  Filled 2024-03-08: qty 1
  Filled 2024-03-08: qty 2
  Filled 2024-03-08: qty 1

## 2024-03-08 MED ORDER — ANTICOAGULANT SODIUM CITRATE 4% (200MG/5ML) IV SOLN: 5.0000 mL | Status: DC | PRN

## 2024-03-08 MED ORDER — DIVALPROEX SODIUM 500 MG PO DR TAB
1000.0000 mg | DELAYED_RELEASE_TABLET | Freq: Two times a day (BID) | ORAL | Status: DC
  Administered 2024-03-08 – 2024-03-13 (×11): 1000 mg via ORAL
  Filled 2024-03-08: qty 2
  Filled 2024-03-08: qty 4
  Filled 2024-03-08 (×9): qty 2

## 2024-03-08 MED ORDER — NEPRO/CARBSTEADY PO LIQD: 237.0000 mL | ORAL | Status: DC | PRN

## 2024-03-08 MED ORDER — LOSARTAN POTASSIUM 50 MG PO TABS
50.0000 mg | ORAL_TABLET | Freq: Every day | ORAL | Status: DC
Start: 2024-03-08 — End: 2024-03-11
  Administered 2024-03-08 – 2024-03-11 (×4): 50 mg via ORAL
  Filled 2024-03-08 (×4): qty 1

## 2024-03-08 MED ORDER — ADULT MULTIVITAMIN W/MINERALS CH
1.0000 | ORAL_TABLET | Freq: Every day | ORAL | Status: DC
  Administered 2024-03-08 – 2024-03-13 (×6): 1 via ORAL
  Filled 2024-03-08 (×6): qty 1

## 2024-03-08 NOTE — Consult Note (Signed)
 HPI: 43 year old male here with chest pain.  He has associated shortness of breath.  Currently dialyzing Mondays, Wednesdays and Fridays via catheter.  Less than 2 weeks ago underwent second stage basilic vein fistula creation.  Now having significant edema of the right upper extremity and has been evaluated in the emergency department last week as well with ultrasound.  He remains on Eliquis .  ROS: Right upper extremity edema, chest pain, shortness of breath   Physical Exam Vitals:   03/08/24 0803 03/08/24 0914  BP: 120/83   Pulse: 60   Resp: 12   Temp:  97.8 F (36.6 C)  SpO2: 100%    Awake alert and oriented Significant edema right upper extremity with strong thrill in upper arm All incisions healing well Cannot palpate radial pulse due to edema   Noninvasive vascular imaging (03/06/24) Right Findings:  +----------+------------+---------+-----------+----------+-----------------  -+  RIGHT    CompressiblePhasicitySpontaneousProperties     Summary         +----------+------------+---------+-----------+----------+-----------------  -+  IJV          Full       Yes       Yes                                   +----------+------------+---------+-----------+----------+-----------------  -+  Subclavian              Yes       Yes                                   +----------+------------+---------+-----------+----------+-----------------  -+  Axillary     Full       Yes       Yes                                   +----------+------------+---------+-----------+----------+-----------------  -+  Brachial     Full       Yes       Yes                                   +----------+------------+---------+-----------+----------+-----------------  -+  Radial       Full                                                       +----------+------------+---------+-----------+----------+-----------------  -+  Ulnar        Full                                                        +----------+------------+---------+-----------+----------+-----------------  -+  Cephalic     Full       Yes       Yes              Intimal  thickening  +----------+------------+---------+-----------+----------+-----------------  -+   Right brachial distal, proximal radial and ulnar veins were not  visualized  due to excessive swelling and edema. Complex structure visualized on  today's previous study, also seen on this exam.      Summary:    Right:  No evidence of deep vein thrombosis in the upper extremity. No evidence of  superficial vein thrombosis in the upper extremity. Limited examination.       Assessment and plan: 43 year old male with history as above now with significant swelling of the right upper extremity.  Possibly due to hematoma given that he is on Eliquis  possibly secondary to venous occlusive disease.  Currently too soon to perform fistulogram.  Will need elevation and compression if edema does not resolve could consider CT venogram of the upper extremity to evaluate for compressive syndrome versus primary ligation of the fistula.  Haleemah Buckalew C. Sheree, MD [facility] Office: [phone] Pager: [phone]

## 2024-03-08 NOTE — Hospital Course (Addendum)
 Principal Problem:   Chest pain Active Problems:   Seizure, late effect of stroke (HCC)   Macrocytic anemia   ESRD on hemodialysis (HCC)   Chronic hypoxic respiratory failure, on home oxygen therapy (HCC)   Right arm pain   Hypercoagulable state (HCC)   AVF (arteriovenous fistula) (HCC)  Resolved Problems:   * No resolved hospital problems. *  Consults:***  Procedures:***  Follow-up items:***

## 2024-03-08 NOTE — ED Notes (Signed)
 Floor notified of pt being transported to [facility]

## 2024-03-08 NOTE — H&P (Signed)
 Date: 03/08/2024               Patient Name:  [name] [name] MRN: [id number]  DOB: December 16, 1980 Age / Sex: 43 y.o., male   PCP: Theotis Haze ORN, NP         Medical Service: Internal Medicine Teaching Service         Attending Physician: Dr. Dayton Eastern      First Contact: Schuyler Novak, DO}    Second Contact: Dr. Ozell Kung, MD          Pager Information: First Contact Pager: [phone]   Second Contact Pager: [phone]   SUBJECTIVE   Chief Complaint: Chest Pain  History of Present Illness: [name] [name] is a 43 y.o. male with PMH of ESRD on HD, seizure disorder, prior CVA's, SLE, antiphospholipid syndrome, and obesity who presented with chest pain. He described the chest pain as central without radiation and described it as both sharp and stabbing. At the time of our evaluation, he is chest pain free. He denies any other symptoms, including SOB, vision changes, abdominal pain, nausea, vomitting, or diarrhea. The patient also reports RUE arm swelling. He did undergo a fistula formation in July of this year with transposition of the basilic vein on 0/0/7974. The patient reports that he has noticed swelling since his revision in September. It is mildly painful and has progressively gotten worse. He denies any purulence or drainage.   He was transported from his long-tern care facility, where he resides due to loss of mobility due to recent strokes secondary to antiphospholipid syndrome. He denies any new changes in mobility, reporting that he does require a wheelchair for mobility.    ED Course: Labs significant for Trop 84--> 83 Imaging CXR with cardiomegaly and vascular congestion Consulted Nephrology, IMTS  Meds:  Patient reported:  Eliquis  5mg  BID Keppra  500mg  BID  Losartan  potassium 50mg  daily Toprol  25mg  daily Atorvastatin  40mg  daily   Current Meds  Medication Sig   aspirin  81 MG chewable tablet Chew 1 tablet (81 mg total) by mouth daily.   atorvastatin  (LIPITOR) 40  MG tablet Take 1 tablet (40 mg total) by mouth daily.   guaiFENesin -dextromethorphan (ROBITUSSIN DM) 100-10 MG/5ML syrup Take 5 mLs by mouth every 4 (four) hours as needed for cough.    Past Medical History ESRD on HD Seizure disorder Prior CVA's Anti-phospholipid syndrome Anemia of chronic disease Macrocytic anemia   Past Surgical History Past Surgical History:  Procedure Laterality Date   AV FISTULA PLACEMENT Left 12/30/2017   Procedure: ARTERIOVENOUS (AV) FISTULA CREATION VERSUS INSERTION OF ARTERIOVENOUS GRAFT LEFT ARM;  Surgeon: Oris Krystal FALCON, MD;  Location: [facility] OR;  Service: Vascular;  Laterality: Left;   AV FISTULA PLACEMENT Right 12/24/2023   Procedure: BRACHIOBASCILIC ARTERIOVENOUS (AV) FISTULA CREATION;  Surgeon: Sheree Penne Bruckner, MD;  Location[facility] OR;  Service: Vascular;  Laterality: Right;   BASCILIC VEIN TRANSPOSITION Left 08/15/2018   Procedure: LEFT FIRST STAGE BASCILIC VEIN TRANSPOSITION;  Surgeon: Oris Krystal FALCON, MD;  Location: [facility] OR;  Service: Vascular;  Laterality: Left;   BASCILIC VEIN TRANSPOSITION Left 10/30/2018   Procedure: INSERTION OF GORE-TEX GRAFT LEFT UPPER ARM;  Surgeon: Sheree Penne Bruckner, MD;  Location: [facility] OR;  Service: Vascular;  Laterality: Left;   BASCILIC VEIN TRANSPOSITION Right 02/25/2024   Procedure: TRANSPOSITION, VEIN, BASILIC;  Surgeon: Sheree Penne Bruckner, MD;  Location: [facility] OR;  Service: Vascular;  Laterality: Right;   INSERTION OF DIALYSIS CATHETER N/A 09/20/2018   Procedure: INSERTION  OF TUNNELED DIALYSIS CATHETER RIGHT INTERNAL JUGULAR;  Surgeon: Gretta Lonni PARAS, MD;  Location: [facility] OR;  Service: Vascular;  Laterality: N/A;   IR FLUORO GUIDE CV LINE RIGHT  07/16/2022   IR US  GUIDE VASC ACCESS RIGHT  07/16/2022   RENAL BIOPSY       Social:  Lives at: Long term care facility Occupation: Disability Support: Friends Level of Function: Dependent PCP:  Theotis Haze ORN, NP  Substances: -Tobacco: Vapes -Alcohol: Not  currently -Recreational Drug: Denies  Family History:  Family History  Problem Relation Age of Onset   Diabetes Mother    Diabetes Maternal Grandmother    Seizures Neg Hx        not on mother's side. he doesn't know about his father's side     Allergies: Allergies as of 03/07/2024 - Review Complete 03/07/2024  Allergen Reaction Noted   Cetirizine & related Swelling    Hibiclens  [chlorhexidine  gluconate] Itching 01/07/2024    Review of Systems: A complete ROS was negative except as per HPI.   OBJECTIVE:   Physical Exam: Blood pressure 120/83, pulse 60, temperature 97.8 F (36.6 C), temperature source Oral, resp. rate 12, height 6' (1.829 m), weight 108.9 kg, SpO2 100%.  Constitutional: well-appearing male, in no acute distress HENT: normocephalic atraumatic, mucous membranes moist Eyes: conjunctiva non-erythematous Cardiovascular: regular rate and rhythm, no m/r/g Pulmonary/Chest: normal work of breathing on room air, lungs clear to auscultation bilaterally, decreased breath sounds at the bases bilaterally Abdominal: soft, non-tender, non-distended Extremities: RUE with non pitting edema to the distal fingertips. Proximal upper extremity with induration, erythema, and warm to touch. Incision site noted without drainage.  Neurological: Spasticity noted upon raising RUE Skin: warm and dry  Labs: CBC    Component Value Date/Time   WBC 8.1 03/08/2024 0017   RBC 2.82 (L) 03/08/2024 0017   HGB 9.6 (L) 03/08/2024 0017   HGB 13.0 09/27/2020 1140   HGB 15.5 10/14/2013 1434   HCT 30.3 (L) 03/08/2024 0017   HCT 38.5 09/27/2020 1140   HCT 45.6 10/14/2013 1434   PLT 166 03/08/2024 0017   PLT 69 (LL) 09/27/2020 1140   MCV 107.4 (H) 03/08/2024 0017   MCV 96 09/27/2020 1140   MCV 87.2 10/14/2013 1434   MCH 34.0 03/08/2024 0017   MCHC 31.7 03/08/2024 0017   RDW 14.0 03/08/2024 0017   RDW 14.6 09/27/2020 1140   RDW 13.0 10/14/2013 1434   LYMPHSABS 1.2 03/06/2024 1001    LYMPHSABS 1.2 09/27/2020 1140   LYMPHSABS 1.4 10/14/2013 1434   MONOABS 1.0 03/06/2024 1001   MONOABS 0.5 10/14/2013 1434   EOSABS 0.2 03/06/2024 1001   EOSABS 0.2 09/27/2020 1140   BASOSABS 0.0 03/06/2024 1001   BASOSABS 0.0 09/27/2020 1140   BASOSABS 0.0 10/14/2013 1434     CMP     Component Value Date/Time   NA 136 03/08/2024 0017   NA 138 09/27/2020 1140   K 5.2 (H) 03/08/2024 0017   CL 92 (L) 03/08/2024 0017   CO2 21 (L) 03/08/2024 0017   GLUCOSE 90 03/08/2024 0017   BUN 99 (H) 03/08/2024 0017   BUN 50 (H) 09/27/2020 1140   CREATININE 22.63 (H) 03/08/2024 0017   CREATININE 15.72 (H) 01/24/2021 0938   CALCIUM  8.9 03/08/2024 0017   CALCIUM  8.4 (L) 12/23/2017 0237   PROT 5.9 (L) 03/06/2024 1001   PROT 8.0 09/27/2020 1140   ALBUMIN  2.4 (L) 03/06/2024 1001   ALBUMIN  4.6 09/27/2020 1140   AST 17 03/06/2024  1001   ALT 6 03/06/2024 1001   ALKPHOS 52 03/06/2024 1001   BILITOT 0.5 03/06/2024 1001   BILITOT 0.4 09/27/2020 1140   GFRNONAA 2 (L) 03/08/2024 0017   GFRNONAA 40 (L) 10/24/2015 1603   GFRAA 4 (L) 01/14/2020 2020   GFRAA 47 (L) 10/24/2015 1603    Imaging: DG Chest 2 View Result Date: 03/08/2024 CLINICAL DATA:  Chest pain EXAM: CHEST - 2 VIEW COMPARISON:  03/06/2024 FINDINGS: Cardiomegaly, vascular congestion. No overt edema. Bibasilar atelectasis with low lung volumes. No effusions or acute bony abnormality. IMPRESSION: Cardiomegaly, vascular congestion. Low lung volumes with bibasilar atelectasis. Electronically Signed   By: Franky Crease M.D.   On: 03/08/2024 00:05   UE VENOUS DUPLEX (7am - 7pm) Result Date: 03/06/2024 UPPER VENOUS STUDY  Patient Name:  UKIAH TRAWICK  Date of Exam:   03/06/2024 Medical Rec #: [id number]       Accession #:    [id number] Date of Birth: 07/22/1980      Patient Gender: M Patient Age:   38 years Exam Location:  [facility] Procedure:      VAS US  UPPER EXTREMITY VENOUS DUPLEX Referring Phys: LYNWOOD TEMPLETON  --------------------------------------------------------------------------------  Indications: Edema, Swelling, and rule out DVT. Limitations: Body habitus, poor ultrasound/tissue interface and sutures. Comparison Study: No prior exam. Performing Technologist: Edilia Elden Appl  Examination Guidelines: A complete evaluation includes B-mode imaging, spectral Doppler, color Doppler, and power Doppler as needed of all accessible portions of each vessel. Bilateral testing is considered an integral part of a complete examination. Limited examinations for reoccurring indications may be performed as noted.  Right Findings: +----------+------------+---------+-----------+----------+------------------+ RIGHT     CompressiblePhasicitySpontaneousProperties     Summary       +----------+------------+---------+-----------+----------+------------------+ IJV           Full       Yes       Yes                                 +----------+------------+---------+-----------+----------+------------------+ Subclavian               Yes       Yes                                 +----------+------------+---------+-----------+----------+------------------+ Axillary      Full       Yes       Yes                                 +----------+------------+---------+-----------+----------+------------------+ Brachial      Full       Yes       Yes                                 +----------+------------+---------+-----------+----------+------------------+ Radial        Full                                                     +----------+------------+---------+-----------+----------+------------------+ Ulnar         Full                                                     +----------+------------+---------+-----------+----------+------------------+  Cephalic      Full       Yes       Yes              Intimal thickening  +----------+------------+---------+-----------+----------+------------------+ Right brachial distal, proximal radial and ulnar veins were not visualized due to excessive swelling and edema. Complex structure visualized on today's previous study, also seen on this exam.  Left Findings: +----------+------------+---------+-----------+----------+-------+ LEFT      CompressiblePhasicitySpontaneousPropertiesSummary +----------+------------+---------+-----------+----------+-------+ IJV           Full       Yes       Yes                      +----------+------------+---------+-----------+----------+-------+ Subclavian               Yes       Yes                      +----------+------------+---------+-----------+----------+-------+  Summary:  Right: No evidence of deep vein thrombosis in the upper extremity. No evidence of superficial vein thrombosis in the upper extremity. Limited examination.  Left: No evidence of thrombosis in the subclavian.  *See table(s) above for measurements and observations.  Diagnosing physician: Debby Robertson Electronically signed by Debby Robertson on 03/06/2024 at 3:42:32 PM.    Final    VAS US  DUPLEX DIALYSIS ACCESS (AVF, AVG) Result Date: 03/06/2024 DIALYSIS ACCESS Patient Name:  LINDBERG ZENON  Date of Exam:   03/06/2024 Medical Rec #: [id number]       Accession #:    [id number] Date of Birth: [birth year]      Patient Gender: M Patient Age:   43 years Exam Location:  [facility] Procedure:      VAS US  DUPLEX DIALYSIS ACCESS (AVF, AVG) Referring Phys: LYNWOOD TEMPLETON --------------------------------------------------------------------------------  Reason for Exam: Mechanical complication AVF, s/p AV revision, eval for                  thrombosis. Access Site: Right Upper Extremity. Access Type: Basilic vein transposition. Comparison Study: Previous study on 8.27.2025. Performing Technologist: Edilia Elden Appl  Examination Guidelines: A complete evaluation includes  B-mode imaging, spectral Doppler, color Doppler, and power Doppler as needed of all accessible portions of each vessel. Unilateral testing is considered an integral part of a complete examination. Limited examinations for reoccurring indications may be performed as noted.  Findings: +--------------------+----------+-----------------+--------+ AVF                 PSV (cm/s)Flow Vol (mL/min)Comments +--------------------+----------+-----------------+--------+ Native artery inflow   173          1362                +--------------------+----------+-----------------+--------+ AVF Anastomosis        602                              +--------------------+----------+-----------------+--------+  +------------+----------+-------------+----------+--------+ OUTFLOW VEINPSV (cm/s)Diameter (cm)Depth (cm)Describe +------------+----------+-------------+----------+--------+ Confluence     160                                    +------------+----------+-------------+----------+--------+ Prox UA        279                                    +------------+----------+-------------+----------+--------+  Mid UA         163                                    +------------+----------+-------------+----------+--------+ Dist UA        543                                    +------------+----------+-------------+----------+--------+ Excessive swelling noted and complex structure measuring 6.9 x 2.6 x 6.5 cm in the medial proximal upper arm.   Summary: Patent arteriovenous fistula. *See table(s) above for measurements and observations.  Diagnosing physician: Debby Robertson Electronically signed by Debby Robertson on 03/06/2024 at 3:39:56 PM.    --------------------------------------------------------------------------------   Final      EKG: personally reviewed my interpretation is NSR. Prior EKG similar.  ASSESSMENT & PLAN:   Assessment & Plan by Problem: Principal Problem:   Chest pain Active  Problems:   Essential hypertension   Seizure, late effect of stroke (HCC)   Macrocytic anemia   ESRD on hemodialysis (HCC)   Chronic hypoxic respiratory failure, on home oxygen therapy (HCC)   Right arm pain   Hypercoagulable state (HCC)   AVF (arteriovenous fistula) (HCC)   [name] [name] is a 43 y.o. person living with a history of ESRD on HD, antiphospholipid syndrome, seizure disorder, SLE, prior CVA's, and obesity who presented with chest pain and admitted for chest pain work up on hospital day 0  #Chest Pain #COVID-19 -The patient presented with left 1 day history of chest pain.  He reported that it was centrally located without radiation and described as both sharp and stabbing.  He had resolved by the time he was evaluated by the admitting team in the ER. -Troponin elevated however at baseline and flat. -EKG nonconcerning for acute infarct or ischemic event. -Patient did test positive for COVID last week and has been coughing since.  Suspect this is contributing to his chest pain. -Currently without respiratory distress, pt was on 3L [place] on evaluation, weaned to 1L [place] with SpO2 maintain > 90% -Continue weaning oxygen as tolerated.   #RUE Edema #Suspected Cellulitis  #AV Fistula -Patient presented to the ED initially on 9/14 with right upper extremity swelling that was considered to be secondary to his recent vascular procedure on 9/9.  Ace wrap and outpatient follow-up was recommended at this time. -On 9/19 he presented yet again to the ER with worsening symptoms.  At this time , suspected to be cellulitis secondary to his recent procedure.  He was discharged on a 7-day course of Keflex . -Today right upper extremity with pitting edema to the fingertips, induration, erythema, and warmth to the touch on proximal right upper extremity. -Vascular surgery consulted and following, appreciate their recommendations -U/S of AVF on 9/19 showed complex structure measuring 6.9 x 2.6 x 6.5 in  the medial proximal upper arm.  Likely indicative of a hematoma. -Venous Duplex without signs of DVT -Suspect edema may be secondary to venous occlusive event -Elevate and compress and monitor closely for resolution and edema. -Per vascular surgery consider CT venogram if no resolution over the coming days. -Patient remains without fever or leukocytosis.  Low concern for systemic infection at this tim -Continue Keflex .  #ESRD on HD #Electrolyte derangements -Patient with longstanding history of ESRD and has been on dialysis for the past 6  years. -He did present to the ER on 9/19 after missed dialysis session on Wednesday and was able to undergo dialysis for approximately an hour. -He reports difficulty sitting through dialysis due to needing to wear a mask with recent COVID diagnosis. -On admission creatinine 22.63, BUN 99, potassium 5.2, CO2 21, anion gap 23. -Nephrology consulted, appreciate their recommendations. -Patient will need dialysis however this will likely be tonight or tomorrow due to scheduling.  #Antiphospholipid syndrome #History of multiple CVAs -The patient does have a longstanding history of phospholipid syndrome for which she has followed with hematology in the past however is not currently. -He did have multiple admissions over the past year for CVAs and this has contributed to his being to live at a long-term care facility. -Patient is currently on Eliquis  5 mg twice daily however has been recurrent historically nonadherent. -With antiphospholipid syndrome, warfarin is the preferred agent.  Upon chart review it seems as if this patient has been initiated on warfarin multiple times without adherence in the past.  Given this he was switched from warfarin to Eliquis . -At this time we will continue Eliquis  5 mg and encourage patient to follow-up with hematology outpatient -PT/OT consult  #Anemia of chronic disease #Macrocytic anemia -The patient does have a long stay  history of anemia with baseline hemoglobin 9-10 -Patient at baseline on admission with hemoglobin of 9.6. -Likely secondary to ESRD. -MCV elevation was 107.4.  After chart review it seems like the patient was normocytic until July of this year. -Will obtain folate and B12 to evaluate further.  #Seizure Disorder -Patient does have a well-documented history of recurrent seizures due to nonadherence to outpatient medication. -He does live at a long-term care facility and has been about 6 months.  Suspect this is contributing to increased medical adherence. -Most recent seizure noted 10/25/2023 -Continue Keppra  500 mg twice daily  #Hypertension -Patient mildly hypertensive on presented to the ER at 145/96 -Continue home medications of losartan  50 mg daily and Toprol  XL 25 mg daily  Best practice: Diet: Normal VTE: DOAC IVF: None,None Code: Full  Disposition planning: Prior to Admission Living Arrangement: SNF, living long tern Anticipated Discharge Location: SNF  Dispo: Admit patient to Observation with expected length of stay less than 2 midnights.  Signed: Myrna Bitters, DO Internal Medicine Resident  03/08/2024, 10:58 AM  On Call pager: [phone]

## 2024-03-08 NOTE — Consult Note (Addendum)
 Renal Service Consult Note [facility] Lamar JONETTA Fret, MD  Patient: [name] [name] Date: 03/08/2024 Requesting Physician: Dr. Rosan  Reason for Consult: ESRD pt w/ chest pain HPI: The patient is a 43 y.o. year-old w/ PMH as below who presented to ED late last night complaining of chest pain for 1 to 2 hours.  Missed Friday dialysis due to new COVID diagnosis.  Takes dialysis MWF.  In the ED BP 154/90, HR 80s, respiratory 20s, O2 sat 96% on 3 LPM [place] O2.  Labs showed K+ 5.2, BUN 99, Cr 22.6.  AG 23, Hgb 9.6, WBC 8K.  CXR showed vascular congestion no edema.  Patient was admitted.  We are asked to see for dialysis.   Pt seen in room.  Patient was fast asleep and only arousable for a half minute or so. No history obtained.   ROS - n/a   Past Medical History  Past Medical History:  Diagnosis Date   ESRD on hemodialysis (HCC)    Hypertension    Lupus    Seizures (HCC)    Seizures (HCC)    Stroke (HCC)    86 or 43 years of age.     Wears glasses    Past Surgical History  Past Surgical History:  Procedure Laterality Date   AV FISTULA PLACEMENT Left 12/30/2017   Procedure: ARTERIOVENOUS (AV) FISTULA CREATION VERSUS INSERTION OF ARTERIOVENOUS GRAFT LEFT ARM;  Surgeon: Oris Krystal FALCON, MD;  Location: [facility] OR;  Service: Vascular;  Laterality: Left;   AV FISTULA PLACEMENT Right 12/24/2023   Procedure: BRACHIOBASCILIC ARTERIOVENOUS (AV) FISTULA CREATION;  Surgeon: Sheree Penne Bruckner, MD;  Location: [facility] OR;  Service: Vascular;  Laterality: Right;   BASCILIC VEIN TRANSPOSITION Left 08/15/2018   Procedure: LEFT FIRST STAGE BASCILIC VEIN TRANSPOSITION;  Surgeon: Oris Krystal FALCON, MD;  Location: [facility] OR;  Service: Vascular;  Laterality: Left;   BASCILIC VEIN TRANSPOSITION Left 10/30/2018   Procedure: INSERTION OF GORE-TEX GRAFT LEFT UPPER ARM;  Surgeon: Sheree Penne Bruckner, MD;  Location: [facility] OR;  Service: Vascular;  Laterality: Left;   BASCILIC VEIN TRANSPOSITION Right 02/25/2024    Procedure: TRANSPOSITION, VEIN, BASILIC;  Surgeon: Sheree Penne Bruckner, MD;  Location: [facility] OR;  Service: Vascular;  Laterality: Right;   INSERTION OF DIALYSIS CATHETER N/A 09/20/2018   Procedure: INSERTION OF TUNNELED DIALYSIS CATHETER RIGHT INTERNAL JUGULAR;  Surgeon: Gretta Bruckner PARAS, MD;  Location: [facility] OR;  Service: Vascular;  Laterality: N/A;   IR FLUORO GUIDE CV LINE RIGHT  07/16/2022   IR US  GUIDE VASC ACCESS RIGHT  07/16/2022   RENAL BIOPSY     Family History  Family History  Problem Relation Age of Onset   Diabetes Mother    Diabetes Maternal Grandmother    Seizures Neg Hx        not on mother's side. he doesn't know about his father's side   Social History  reports that he has quit smoking. His smoking use included e-cigarettes. He has never used smokeless tobacco. He reports that he does not currently use alcohol. He reports that he does not use drugs. Allergies  Allergies  Allergen Reactions   Cetirizine & Related Swelling   Hibiclens  [Chlorhexidine  Gluconate] Itching   Home medications Prior to Admission medications   Medication Sig Start Date End Date Taking? Authorizing Provider  aspirin  81 MG chewable tablet Chew 1 tablet (81 mg total) by mouth daily. 01/13/24  Yes Shitarev, Dimitry, MD  atorvastatin  (LIPITOR) 40 MG tablet Take 1 tablet (  40 mg total) by mouth daily. 08/03/23  Yes Darci Pore, MD  guaiFENesin -dextromethorphan (ROBITUSSIN DM) 100-10 MG/5ML syrup Take 5 mLs by mouth every 4 (four) hours as needed for cough. 03/01/24  Yes Elnor Savant A, DO  acetaminophen  (TYLENOL ) 325 MG tablet Take 2 tablets (650 mg total) by mouth every 6 (six) hours as needed for mild pain. 02/13/23   Danton Reyes DASEN, MD  apixaban  (ELIQUIS ) 5 MG TABS tablet Take 1 tablet (5 mg total) by mouth 2 (two) times daily. 11/12/23   Gonfa, Taye T, MD  AURYXIA  1 GM 210 MG(Fe) tablet Take 840 mg by mouth See admin instructions. Take 840 mg by mouth every evening on Monday, Wednesday,  and Friday. Take 840 mg by mouth three times daily on Tuesday, Thursday, Saturday, and Sunday. 10/07/23   [provider]  cephALEXin  (KEFLEX ) 500 MG capsule Take 1 capsule (500 mg total) by mouth every 12 hours. 03/06/24   Patsey Lot, MD  cyclobenzaprine  (FLEXERIL ) 10 MG tablet Take 1 tablet (10 mg total) by mouth 2 (two) times daily as needed for muscle spasms. 03/01/24   Elnor Savant LABOR, DO  diphenhydrAMINE  (BENADRYL ) 25 mg capsule Take 25 mg by mouth once.    [provider]  divalproex  (DEPAKOTE ) 500 MG DR tablet Take 2 tablets (1,000 mg total) by mouth every 12 (twelve) hours. 11/12/23   Gonfa, Taye T, MD  imiquimod  (ALDARA ) 5 % cream Apply to affected area three times weekly for 8 weeks. Patient taking differently: Apply 1 Application topically every Tuesday, Thursday, and Saturday at 6 PM. Apply to perianal area 01/13/24 01/12/25  Shitarev, Dimitry, MD  levETIRAcetam  (KEPPRA ) 500 MG tablet Take 1 tablet (500 mg total) by mouth 2 (two) times daily. 01/12/24   Alba Sharper, MD  losartan  (COZAAR ) 50 MG tablet Take 50 mg by mouth daily.    [provider]  metoprolol  succinate (TOPROL -XL) 25 MG 24 hr tablet Take 1 tablet (25 mg total) by mouth daily. Patient taking differently: Take 25 mg by mouth daily in the afternoon. 01/13/24   Shitarev, Dimitry, MD  molnupiravir EUA (LAGEVRIO) 200 MG CAPS capsule Take 4 capsules by mouth 2 (two) times daily. Patient not taking: Reported on 03/06/2024    [provider]  Multiple Vitamins-Minerals (MULTIVITAMIN WITH MINERALS) tablet Take 1 tablet by mouth daily.    [provider]  nitroGLYCERIN  (NITROSTAT ) 0.4 MG SL tablet Place 0.4 mg under the tongue every 5 (five) minutes as needed for chest pain.    [provider]     Vitals:   03/08/24 1115 03/08/24 1130 03/08/24 1315 03/08/24 1400  BP: (!) 148/103 (!) 138/90  109/76  Pulse: 81 61    Resp: 18 12    Temp:   (!) 97.4 F (36.3 C)   TempSrc:    Oral   SpO2: 100%   100%  Weight:      Height:       Exam Gen no distress, lethargic, moves all ext Sclera anicteric, throat clear  No jvd or bruits Chest clear bilat to bases RRR no MRG Abd soft ntnd no mass or ascites +bs Ext 2+ RUE edema, + bruit over AVF No LUE edema, no sig LE edema Neuro is lethargic, arousable    A+bruit   Home bp meds: Losartan  50mg  every day Metoprolol  XL 25mg  daily Others: eliquis , asa, statin, depakote , keppra    OP HD: [facility] MWF 4h  B400   120kg    2K bath  TDC/ maturing AVF  Heparin  none Mircera 100 mcg q 2 wks, last 9/10, due 9/24 Missed HD last Monday and Friday Last OP HD 9/17 (Wed) w/ post wt 120.9kg  CXR 9/19 - vasc congestion, +mild IS edema CXR 9/10 - vasc congestion   Assessment/ Plan: Chest pain: trops negative, per pmd Covid 19 infection: per pmd SOB/ vol overload: CXR's show vasc congestion, mild edema on CXR yesterday.  RUE swelling: s/p 2nd stage basilic vein AVF surgery done 9/09. Sig swelling, seen by [facility] -> possible hematoma (on eliquis ) vs venous occlusive disease, they will consider CT venogram if elevation/ compression doesn't help.  ESRD: on HD MWF. Missed HD x 2 last week. HD tonight due to pulm edema.  HTN: BP's normal to high-normal here; getting his 2 home BP meds here.  Volume: wt's look low, will need standing wt if he can when he is less lethargic. RUE edema, otherwise no sig edema.  Anemia of esrd: Hb 9-10 here, next esa due in 3 days. Will order darbe to start Wed 9/24 at 100 mcg weekly sq.        Myer Fret  MD [facility] 03/08/2024, 3:21 PM  Recent Labs  Lab 03/06/24 1001 03/08/24 0017  HGB 9.0* 9.6*  ALBUMIN  2.4*  --   CALCIUM  8.8* 8.9  CREATININE 20.23* 22.63*  K 4.8 5.2*   Inpatient medications:  acetaminophen   1,000 mg Oral Q6H WA   apixaban   5 mg Oral BID   aspirin   81 mg Oral Daily   atorvastatin   40 mg Oral Daily   cephALEXin   500 mg Oral Q12H   divalproex   1,000 mg Oral Q12H   levETIRAcetam    500 mg Oral BID   losartan   50 mg Oral Daily   metoprolol  succinate  25 mg Oral Daily   multivitamin with minerals  1 tablet Oral Daily

## 2024-03-08 NOTE — ED Provider Notes (Signed)
 [facility]ERGENCY DEPARTMENT AT [facility] Provider Note   CSN: [id number] Arrival date & time: 03/07/24  2309     Patient presents with: Chest Pain   [name] [name] is a 43 y.o. male.   The history is provided by the patient and medical records.  Chest Pain [name] [name] is a 43 y.o. male who presents to the Emergency Department complaining of chest pain. He presents the emergency department by EMS from [facility] for evaluation of chest pain that did not respond to nitroglycerin  facility also states that they put him on 2 L nasal cannula due to oxygen saturations in the 80s. At initial time of ED arrival patient was not able to provide much history. On repeat evaluation patient does report that he is been experiencing chest pain intermittently today. He does report that he only had a partial dialysis session on Friday due to presenting to the emergency department for evaluation. He does also reports some increase shortness of breath. He was recently treated for COVID-19. He has ESR D and dialysis Monday, Wednesday, Friday. He also has a history of lupus, prior CVA, seizure disorder      Prior to Admission medications   Medication Sig Start Date End Date Taking? Authorizing Provider  acetaminophen  (TYLENOL ) 325 MG tablet Take 2 tablets (650 mg total) by mouth every 6 (six) hours as needed for mild pain. 02/13/23   Danton Reyes DASEN, MD  apixaban  (ELIQUIS ) 5 MG TABS tablet Take 1 tablet (5 mg total) by mouth 2 (two) times daily. 11/12/23   Kathrin Mignon DASEN, MD  aspirin  81 MG chewable tablet Chew 1 tablet (81 mg total) by mouth daily. Patient taking differently: Chew 81 mg by mouth daily in the afternoon. 01/13/24   Shitarev, Dimitry, MD  atorvastatin  (LIPITOR) 40 MG tablet Take 1 tablet (40 mg total) by mouth daily. Patient taking differently: Take 40 mg by mouth every evening. 08/03/23   Darci Pore, MD  AURYXIA  1 GM 210 MG(Fe) tablet Take 840 mg by mouth See  admin instructions. Take 840 mg by mouth every evening on Monday, Wednesday, and Friday. Take 840 mg by mouth three times daily on Tuesday, Thursday, Saturday, and Sunday. 10/07/23   [provider]  cephALEXin  (KEFLEX ) 500 MG capsule Take 1 capsule (500 mg total) by mouth every 12 hours. 03/06/24   Patsey Lot, MD  cyclobenzaprine  (FLEXERIL ) 10 MG tablet Take 1 tablet (10 mg total) by mouth 2 (two) times daily as needed for muscle spasms. 03/01/24   Elnor Jayson LABOR, DO  diphenhydrAMINE  (BENADRYL ) 25 mg capsule Take 25 mg by mouth once.    [provider]  divalproex  (DEPAKOTE ) 500 MG DR tablet Take 2 tablets (1,000 mg total) by mouth every 12 (twelve) hours. 11/12/23   Gonfa, Taye T, MD  guaiFENesin -dextromethorphan (ROBITUSSIN DM) 100-10 MG/5ML syrup Take 5 mLs by mouth every 4 (four) hours as needed for cough. Patient taking differently: Take 5 mLs by mouth every 6 (six) hours as needed for cough. 03/01/24   Elnor Jayson A, DO  imiquimod  (ALDARA ) 5 % cream Apply to affected area three times weekly for 8 weeks. Patient taking differently: Apply 1 Application topically every Tuesday, Thursday, and Saturday at 6 PM. Apply to perianal area 01/13/24 01/12/25  Shitarev, Dimitry, MD  levETIRAcetam  (KEPPRA ) 500 MG tablet Take 1 tablet (500 mg total) by mouth 2 (two) times daily. 01/12/24   Alba Sharper, MD  losartan  (COZAAR ) 50 MG tablet Take 50  mg by mouth daily.    [provider]  metoprolol  succinate (TOPROL -XL) 25 MG 24 hr tablet Take 1 tablet (25 mg total) by mouth daily. Patient taking differently: Take 25 mg by mouth daily in the afternoon. 01/13/24   Shitarev, Dimitry, MD  molnupiravir EUA (LAGEVRIO) 200 MG CAPS capsule Take 4 capsules by mouth 2 (two) times daily. Patient not taking: Reported on 03/06/2024    [provider]  Multiple Vitamins-Minerals (MULTIVITAMIN WITH MINERALS) tablet Take 1 tablet by mouth daily.    [provider]  nitroGLYCERIN   (NITROSTAT ) 0.4 MG SL tablet Place 0.4 mg under the tongue every 5 (five) minutes as needed for chest pain.    [provider]    Allergies: Cetirizine & related and Hibiclens  [chlorhexidine  gluconate]    Review of Systems  Cardiovascular:  Positive for chest pain.    Updated Vital Signs BP 108/73   Pulse 66   Temp (!) 97.4 F (36.3 C) (Oral)   Resp 12   Ht 6' (1.829 m)   Wt 108.9 kg   SpO2 94%   BMI 32.55 kg/m   Physical Exam Vitals and nursing note reviewed.  Constitutional:      Appearance: He is well-developed.  HENT:     Head: Normocephalic and atraumatic.  Cardiovascular:     Rate and Rhythm: Normal rate and regular rhythm.     Heart sounds: No murmur heard. Pulmonary:     Effort: Pulmonary effort is normal. No respiratory distress.     Comments: Decreased air movement in bases. Vas cath in right upper chest wall Abdominal:     Palpations: Abdomen is soft.     Tenderness: There is no abdominal tenderness. There is no guarding or rebound.  Musculoskeletal:        General: No tenderness.     Comments: Nonpitting edema to BLE.  Pitting edema to RUE with healing wound to right upper arm.  Palpable thrill in RUE  Skin:    General: Skin is warm and dry.  Neurological:     Mental Status: He is alert and oriented to person, place, and time.  Psychiatric:        Behavior: Behavior normal.     (all labs ordered are listed, but only abnormal results are displayed) Labs Reviewed  BASIC METABOLIC PANEL WITH GFR - Abnormal; Notable for the following components:      Result Value   Potassium 5.2 (*)    Chloride 92 (*)    CO2 21 (*)    BUN 99 (*)    Creatinine, Ser 22.63 (*)    GFR, Estimated 2 (*)    Anion gap 23 (*)    All other components within normal limits  CBC - Abnormal; Notable for the following components:   RBC 2.82 (*)    Hemoglobin 9.6 (*)    HCT 30.3 (*)    MCV 107.4 (*)    All other components within normal limits  TROPONIN I (HIGH  SENSITIVITY) - Abnormal; Notable for the following components:   Troponin I (High Sensitivity) 84 (*)    All other components within normal limits  TROPONIN I (HIGH SENSITIVITY) - Abnormal; Notable for the following components:   Troponin I (High Sensitivity) 83 (*)    All other components within normal limits    EKG: EKG Interpretation Date/Time:  Saturday March 07 2024 23:21:00 EDT Ventricular Rate:  86 PR Interval:  156 QRS Duration:  117 QT Interval:  398 QTC  Calculation: 476 R Axis:   -63  Text Interpretation: Sinus rhythm Nonspecific IVCD with LAD Inferior infarct, old Probable lateral infarct, age indeterminate Confirmed by Griselda Norris [phone]) on 03/07/2024 11:23:11 PM  Radiology: DG Chest 2 View Result Date: 03/08/2024 CLINICAL DATA:  Chest pain EXAM: CHEST - 2 VIEW COMPARISON:  03/06/2024 FINDINGS: Cardiomegaly, vascular congestion. No overt edema. Bibasilar atelectasis with low lung volumes. No effusions or acute bony abnormality. IMPRESSION: Cardiomegaly, vascular congestion. Low lung volumes with bibasilar atelectasis. Electronically Signed   By: Franky Crease M.D.   On: 03/08/2024 00:05   UE VENOUS DUPLEX (7am - 7pm) Result Date: 03/06/2024 UPPER VENOUS STUDY  Patient Name:  [name] [name]  Date of Exam:   03/06/2024 Medical Rec #: [id number]       Accession #:    [id number] Date of Birth: Feb 09, 1981      Patient Gender: M Patient Age:   32 years Exam Location:  [facility] Procedure:      VAS US  UPPER EXTREMITY VENOUS DUPLEX Referring Phys: LYNWOOD TEMPLETON --------------------------------------------------------------------------------  Indications: Edema, Swelling, and rule out DVT. Limitations: Body habitus, poor ultrasound/tissue interface and sutures. Comparison Study: No prior exam. Performing Technologist: Edilia Elden Appl  Examination Guidelines: A complete evaluation includes B-mode imaging, spectral Doppler, color Doppler, and power Doppler as needed of  all accessible portions of each vessel. Bilateral testing is considered an integral part of a complete examination. Limited examinations for reoccurring indications may be performed as noted.  Right Findings: +----------+------------+---------+-----------+----------+------------------+ RIGHT     CompressiblePhasicitySpontaneousProperties     Summary       +----------+------------+---------+-----------+----------+------------------+ IJV           Full       Yes       Yes                                 +----------+------------+---------+-----------+----------+------------------+ Subclavian               Yes       Yes                                 +----------+------------+---------+-----------+----------+------------------+ Axillary      Full       Yes       Yes                                 +----------+------------+---------+-----------+----------+------------------+ Brachial      Full       Yes       Yes                                 +----------+------------+---------+-----------+----------+------------------+ Radial        Full                                                     +----------+------------+---------+-----------+----------+------------------+ Ulnar         Full                                                     +----------+------------+---------+-----------+----------+------------------+  Cephalic      Full       Yes       Yes              Intimal thickening +----------+------------+---------+-----------+----------+------------------+ Right brachial distal, proximal radial and ulnar veins were not visualized due to excessive swelling and edema. Complex structure visualized on today's previous study, also seen on this exam.  Left Findings: +----------+------------+---------+-----------+----------+-------+ LEFT      CompressiblePhasicitySpontaneousPropertiesSummary +----------+------------+---------+-----------+----------+-------+ IJV            Full       Yes       Yes                      +----------+------------+---------+-----------+----------+-------+ Subclavian               Yes       Yes                      +----------+------------+---------+-----------+----------+-------+  Summary:  Right: No evidence of deep vein thrombosis in the upper extremity. No evidence of superficial vein thrombosis in the upper extremity. Limited examination.  Left: No evidence of thrombosis in the subclavian.  *See table(s) above for measurements and observations.  Diagnosing physician: Debby Robertson Electronically signed by Debby Robertson on 03/06/2024 at 3:42:32 PM.    Final    VAS US  DUPLEX DIALYSIS ACCESS (AVF, AVG) Result Date: 03/06/2024 DIALYSIS ACCESS Patient Name:  [name] [name]  Date of Exam:   03/06/2024 Medical Rec #: [id number]       Accession #:    [id number] Date of Birth: Oct 19, 1980      Patient Gender: M Patient Age:   27 years Exam Location:  [facility] Procedure:      VAS US  DUPLEX DIALYSIS ACCESS (AVF, AVG) Referring Phys: LYNWOOD TEMPLETON --------------------------------------------------------------------------------  Reason for Exam: Mechanical complication AVF, s/p AV revision, eval for                  thrombosis. Access Site: Right Upper Extremity. Access Type: Basilic vein transposition. Comparison Study: Previous study on 8.27.2025. Performing Technologist: Edilia Elden Appl  Examination Guidelines: A complete evaluation includes B-mode imaging, spectral Doppler, color Doppler, and power Doppler as needed of all accessible portions of each vessel. Unilateral testing is considered an integral part of a complete examination. Limited examinations for reoccurring indications may be performed as noted.  Findings: +--------------------+----------+-----------------+--------+ AVF                 PSV (cm/s)Flow Vol (mL/min)Comments +--------------------+----------+-----------------+--------+ Native artery inflow    173          1362                +--------------------+----------+-----------------+--------+ AVF Anastomosis        602                              +--------------------+----------+-----------------+--------+  +------------+----------+-------------+----------+--------+ OUTFLOW VEINPSV (cm/s)Diameter (cm)Depth (cm)Describe +------------+----------+-------------+----------+--------+ Confluence     160                                    +------------+----------+-------------+----------+--------+ Prox UA        279                                    +------------+----------+-------------+----------+--------+  Mid UA         163                                    +------------+----------+-------------+----------+--------+ Dist UA        543                                    +------------+----------+-------------+----------+--------+ Excessive swelling noted and complex structure measuring 6.9 x 2.6 x 6.5 cm in the medial proximal upper arm.   Summary: Patent arteriovenous fistula. *See table(s) above for measurements and observations.  Diagnosing physician: Debby Robertson Electronically signed by Debby Robertson on 03/06/2024 at 3:39:56 PM.    --------------------------------------------------------------------------------   Final    DG Chest 2 View Result Date: 03/06/2024 CLINICAL DATA:  Shortness of breath, nonproductive cough. EXAM: CHEST - 2 VIEW COMPARISON:  March 01, 2024. FINDINGS: Stable cardiomegaly with central pulmonary vascular congestion. Minimal pulmonary edema may be present. Right internal jugular dialysis catheter is unchanged. Bony thorax is unremarkable. IMPRESSION: Stable cardiomegaly with central pulmonary vascular congestion. Minimal pulmonary edema may be present. Electronically Signed   By: Lynwood Landy Raddle M.D.   On: 03/06/2024 10:33     Procedures   Medications Ordered in the ED - No data to display                                  Medical  Decision Making Amount and/or Complexity of Data Reviewed Labs: ordered. Radiology: ordered.  Risk Decision regarding hospitalization.   Patient was ESR D on hemodialysis, SLE, CVA here for evaluation of chest pain, shortness of breath. EKG is similar when compared to priors. Chest x-ray demonstrates pulmonary vascular congestion. He is on 3 L to maintain sats in the mid 90s. Work of breathing is appropriate. Troponins are elevated but stable when compared to priors. BMP with mild hyperkalemia, significant elevation and BUN. Discussed with Dr. Geralynn with nephrology concern that patient will need dialysis for volume overload - nephrology will CMA consulted for dialysis. Medicine consulted for admission for ongoing observation and management.     Final diagnoses:  Precordial pain  Other hypervolemia  ESRD (end stage renal disease) on dialysis [facility])    ED Discharge Orders     None          Griselda Norris, MD 03/08/24 [phone]

## 2024-03-09 ENCOUNTER — Observation Stay (HOSPITAL_COMMUNITY)

## 2024-03-09 DIAGNOSIS — D631 Anemia in chronic kidney disease: Secondary | ICD-10-CM | POA: Diagnosis present

## 2024-03-09 DIAGNOSIS — I69398 Other sequelae of cerebral infarction: Secondary | ICD-10-CM | POA: Diagnosis not present

## 2024-03-09 DIAGNOSIS — Z792 Long term (current) use of antibiotics: Secondary | ICD-10-CM

## 2024-03-09 DIAGNOSIS — Z992 Dependence on renal dialysis: Secondary | ICD-10-CM | POA: Diagnosis not present

## 2024-03-09 DIAGNOSIS — G40909 Epilepsy, unspecified, not intractable, without status epilepticus: Secondary | ICD-10-CM | POA: Diagnosis present

## 2024-03-09 DIAGNOSIS — D6861 Antiphospholipid syndrome: Secondary | ICD-10-CM | POA: Diagnosis present

## 2024-03-09 DIAGNOSIS — R072 Precordial pain: Secondary | ICD-10-CM | POA: Diagnosis present

## 2024-03-09 DIAGNOSIS — J9611 Chronic respiratory failure with hypoxia: Secondary | ICD-10-CM | POA: Diagnosis present

## 2024-03-09 DIAGNOSIS — I1311 Hypertensive heart and chronic kidney disease without heart failure, with stage 5 chronic kidney disease, or end stage renal disease: Secondary | ICD-10-CM | POA: Diagnosis present

## 2024-03-09 DIAGNOSIS — I953 Hypotension of hemodialysis: Secondary | ICD-10-CM | POA: Diagnosis not present

## 2024-03-09 DIAGNOSIS — L03113 Cellulitis of right upper limb: Secondary | ICD-10-CM | POA: Diagnosis present

## 2024-03-09 DIAGNOSIS — U071 COVID-19: Secondary | ICD-10-CM | POA: Diagnosis present

## 2024-03-09 DIAGNOSIS — M329 Systemic lupus erythematosus, unspecified: Secondary | ICD-10-CM | POA: Diagnosis present

## 2024-03-09 DIAGNOSIS — Z833 Family history of diabetes mellitus: Secondary | ICD-10-CM | POA: Diagnosis not present

## 2024-03-09 DIAGNOSIS — Z7901 Long term (current) use of anticoagulants: Secondary | ICD-10-CM | POA: Diagnosis not present

## 2024-03-09 DIAGNOSIS — Z9981 Dependence on supplemental oxygen: Secondary | ICD-10-CM | POA: Diagnosis not present

## 2024-03-09 DIAGNOSIS — I120 Hypertensive chronic kidney disease with stage 5 chronic kidney disease or end stage renal disease: Secondary | ICD-10-CM | POA: Diagnosis not present

## 2024-03-09 DIAGNOSIS — T8140XA Infection following a procedure, unspecified, initial encounter: Secondary | ICD-10-CM | POA: Diagnosis present

## 2024-03-09 DIAGNOSIS — N186 End stage renal disease: Secondary | ICD-10-CM | POA: Diagnosis present

## 2024-03-09 DIAGNOSIS — E875 Hyperkalemia: Secondary | ICD-10-CM | POA: Diagnosis present

## 2024-03-09 DIAGNOSIS — R5381 Other malaise: Secondary | ICD-10-CM | POA: Diagnosis not present

## 2024-03-09 DIAGNOSIS — E669 Obesity, unspecified: Secondary | ICD-10-CM | POA: Diagnosis present

## 2024-03-09 DIAGNOSIS — M7989 Other specified soft tissue disorders: Secondary | ICD-10-CM | POA: Diagnosis present

## 2024-03-09 DIAGNOSIS — R600 Localized edema: Secondary | ICD-10-CM | POA: Diagnosis not present

## 2024-03-09 DIAGNOSIS — Z6832 Body mass index (BMI) 32.0-32.9, adult: Secondary | ICD-10-CM | POA: Diagnosis not present

## 2024-03-09 DIAGNOSIS — F17290 Nicotine dependence, other tobacco product, uncomplicated: Secondary | ICD-10-CM | POA: Diagnosis present

## 2024-03-09 DIAGNOSIS — Z79899 Other long term (current) drug therapy: Secondary | ICD-10-CM | POA: Diagnosis not present

## 2024-03-09 LAB — BASIC METABOLIC PANEL WITH GFR
Anion gap: 17 — ABNORMAL HIGH (ref 5–15)
BUN: 54 mg/dL — ABNORMAL HIGH (ref 6–20)
CO2: 25 mmol/L (ref 22–32)
Calcium: 9.1 mg/dL (ref 8.9–10.3)
Chloride: 93 mmol/L — ABNORMAL LOW (ref 98–111)
Creatinine, Ser: 14.8 mg/dL — ABNORMAL HIGH (ref 0.61–1.24)
GFR, Estimated: 4 mL/min — ABNORMAL LOW (ref >60)
Glucose, Bld: 68 mg/dL — ABNORMAL LOW (ref 70–99)
Potassium: 4.4 mmol/L (ref 3.5–5.1)
Sodium: 135 mmol/L (ref 135–145)

## 2024-03-09 MED ORDER — VANCOMYCIN HCL IN DEXTROSE 1-5 GM/200ML-% IV SOLN
1000.0000 mg | INTRAVENOUS | Status: DC
Start: 2024-03-11 — End: 2024-03-13
  Administered 2024-03-11 – 2024-03-13 (×2): 1000 mg via INTRAVENOUS
  Filled 2024-03-09: qty 200

## 2024-03-09 MED ORDER — IOHEXOL 350 MG/ML SOLN
75.0000 mL | Freq: Once | INTRAVENOUS | Status: AC | PRN
  Administered 2024-03-09: 75 mL via INTRAVENOUS

## 2024-03-09 MED ORDER — VANCOMYCIN VARIABLE DOSE PER UNSTABLE RENAL FUNCTION (PHARMACIST DOSING)
Status: DC
Start: 2024-03-09 — End: 2024-03-09

## 2024-03-09 MED ORDER — VANCOMYCIN HCL 2000 MG/400ML IV SOLN
2000.0000 mg | Freq: Once | INTRAVENOUS | Status: AC
  Administered 2024-03-09: 2000 mg via INTRAVENOUS
  Filled 2024-03-09: qty 400

## 2024-03-09 MED ORDER — VANCOMYCIN HCL 2000 MG/400ML IV SOLN
2000.0000 mg | Freq: Once | INTRAVENOUS | Status: DC
  Filled 2024-03-09: qty 400

## 2024-03-09 MED ORDER — VANCOMYCIN HCL IN DEXTROSE 1-5 GM/200ML-% IV SOLN: 1000.0000 mg | INTRAVENOUS | Status: DC

## 2024-03-09 NOTE — Evaluation (Signed)
 Occupational Therapy Evaluation Patient Details Name: [name] [name] MRN: [id number] DOB: 1980-07-02 Today's Date: 03/09/2024   History of Present Illness   [name] [name] is a 43 y.o. male who presented from [facility] with chest pain. He was recently treated for COVID-19. PMH includes ESRD on hemodialysis, AV fistula placement (Right, 12/24/2023), HTN, Lupus, Seizures (May 2025), Stroke (May 2025), and Wears glasses.     Clinical Impressions [name] [name] was evaluated s/p the above admission list. He has been at [facility] and walking short distances with RW and having assist for all ADLs at baseline. Pt states he plans to discharge to his moms house, number not in the chart to call and confirm. Upon evaluation the pt was limited by impaired cognition, limited spatial awareness, R inattention, RUE pain and edema, unsteady balance, incontinent/urgent BM, R vision?, unsteady balance and poor activity tolerance. Overall he needed min A for transfers and mobility with cues for RW management. Due to the deficits listed below the pt also needs up to max A for LB ADLs and min A for UB ADLs. Pt will benefit from continued acute OT services and intensive inpatient follow up therapy, >3 hours/day after discharge.       If plan is discharge home, recommend the following:   A lot of help with walking and/or transfers;A lot of help with bathing/dressing/bathroom;Assistance with cooking/housework;Direct supervision/assist for financial management;Direct supervision/assist for medications management;Assist for transportation;Help with stairs or ramp for entrance     Functional Status Assessment   Patient has had a recent decline in their functional status and demonstrates the ability to make significant improvements in function in a reasonable and predictable amount of time.     Equipment Recommendations   None recommended by OT (if he goes to his moms house he will need a RW and BSC.)      Recommendations for Other Services   Rehab consult     Precautions/Restrictions   Precautions Precautions: Fall Recall of Precautions/Restrictions: Impaired Precaution/Restrictions Comments: watch O2 Restrictions Weight Bearing Restrictions Per Provider Order: No     Mobility Bed Mobility Overal bed mobility: Needs Assistance Bed Mobility: Supine to Sit, Sit to Supine     Supine to sit: Mod assist Sit to supine: Min assist   General bed mobility comments: mod A to bring L hip to the EOB    Transfers Overall transfer level: Needs assistance Equipment used: Rolling walker (2 wheels) Transfers: Sit to/from Stand Sit to Stand: Min assist           General transfer comment: min A to power up and for balance, cues for RW management      Balance Overall balance assessment: Needs assistance Sitting-balance support: Feet supported Sitting balance-Leahy Scale: Poor Sitting balance - Comments: a few posterior LOBs noted   Standing balance support: No upper extremity supported, During functional activity Standing balance-Leahy Scale: Poor Standing balance comment: min A for static standing at the sink       ADL either performed or assessed with clinical judgement   ADL Overall ADL's : Needs assistance/impaired Eating/Feeding: Set up;Sitting   Grooming: Minimal assistance;Standing   Upper Body Bathing: Minimal assistance;Sitting   Lower Body Bathing: Maximal assistance;Sit to/from stand   Upper Body Dressing : Minimal assistance;Sitting   Lower Body Dressing: Maximal assistance;Sit to/from stand   Toilet Transfer: Moderate assistance;Ambulation;Rolling walker (2 wheels)   Toileting- Clothing Manipulation and Hygiene: Minimal assistance;Sit to/from stand       Functional mobility during ADLs: Minimal  assistance General ADL Comments: pt required significant assist for RW mgmt, RUE management, R vidual field scanning, balance, cues for initiation,  sequencing and problem solving     Vision Baseline Vision/History: 0 No visual deficits Patient Visual Report: No change from baseline Vision Assessment?: Vision impaired- to be further tested in functional context Additional Comments: needs further assessment, per last admission, pt had R inattention with potention field loss. On evaluation pt needed significant cues to manage all items in his L environment     Perception Perception: Impaired Preception Impairment Details: Inattention/Neglect Perception-Other Comments: R inattention   Praxis Praxis: Impaired Praxis Impairment Details: Motor planning, Organization     Pertinent Vitals/Pain Pain Assessment Pain Assessment: Faces Faces Pain Scale: Hurts a little bit Pain Location: RUE with movement and to touch Pain Descriptors / Indicators: Discomfort, Guarding Pain Intervention(s): Limited activity within patient's tolerance, Monitored during session     Extremity/Trunk Assessment Upper Extremity Assessment Upper Extremity Assessment: RUE deficits/detail;Right hand dominant RUE Deficits / Details: edematous throughout, painful ROM and PROM. Pt states he is RHD, however he uses his L hand for all functional tasks. He needed hand over hadn assist to grasp RW RUE Sensation: decreased light touch;decreased proprioception RUE Coordination: decreased fine motor;decreased gross motor   Lower Extremity Assessment Lower Extremity Assessment: Defer to PT evaluation   Cervical / Trunk Assessment Cervical / Trunk Assessment: Normal   Communication Communication Communication: No apparent difficulties   Cognition Arousal: Alert Behavior During Therapy: Flat affect Cognition: No family/caregiver present to determine baseline, Cognition impaired     Awareness: Online awareness impaired Memory impairment (select all impairments): Non-declarative long-term memory Attention impairment (select first level of impairment): Selective  attention Executive functioning impairment (select all impairments): Organization, Sequencing, Problem solving, Reasoning OT - Cognition Comments: Pt had difficulty with one step commands to initiate and sequence ADL task. He has poor carry over of cues, limited insight to safety. Pt with flat affect throughout.                 Following commands: Impaired Following commands impaired: Follows one step commands inconsistently     Cueing  General Comments   Cueing Techniques: Verbal cues;Gestural cues;Visual cues;Tactile cues  VSS on 2L, SpO2 dropped to 80s on RA           Home Living Family/patient expects to be discharged to:: Private residence Living Arrangements: Parent Available Help at Discharge: Family;Available 24 hours/day Type of Home: House Home Access: Ramped entrance     Home Layout: One level     Bathroom Shower/Tub: Sponge bathes at baseline   Bathroom Toilet: Standard     Home Equipment: None   Additional Comments: Pt presented from SNF, he states the plan is to d/c to his moms house.      Prior Functioning/Environment Prior Level of Function : Needs assist             Mobility Comments: RW at SNF, he somtimes uses the [facility] ADLs Comments: staff assists with LB dressing and bathing, otherwise mod I in the room    OT Problem List: Decreased strength;Decreased range of motion;Decreased activity tolerance;Impaired balance (sitting and/or standing);Impaired vision/perception;Decreased safety awareness;Decreased knowledge of use of DME or AE;Decreased knowledge of precautions;Cardiopulmonary status limiting activity   OT Treatment/Interventions: Self-care/ADL training;Therapeutic exercise;Neuromuscular education;Energy conservation;DME and/or AE instruction;Therapeutic activities;Patient/family education;Balance training      OT Goals(Current goals can be found in the care plan section)   Acute Rehab OT Goals Patient Stated  Goal: to leave OT  Goal Formulation: With patient Potential to Achieve Goals: Good ADL Goals Pt Will Perform Grooming: with modified independence;standing Pt Will Perform Upper Body Dressing: with modified independence Pt Will Perform Lower Body Dressing: with supervision;sit to/from stand Pt Will Transfer to Toilet: with modified independence;ambulating Additional ADL Goal #1: Pt will navigate busy hospital environment while independently scanning to the R for safety and awareness   OT Frequency:  Min 2X/week       AM-PAC OT 6 Clicks Daily Activity     Outcome Measure Help from another person eating meals?: A Little Help from another person taking care of personal grooming?: A Little Help from another person toileting, which includes using toliet, bedpan, or urinal?: A Lot Help from another person bathing (including washing, rinsing, drying)?: A Lot Help from another person to put on and taking off regular upper body clothing?: A Little Help from another person to put on and taking off regular lower body clothing?: A Lot 6 Click Score: 15   End of Session Equipment Utilized During Treatment: Gait belt;Rolling walker (2 wheels);Oxygen Nurse Communication: Mobility status  Activity Tolerance: Patient limited by fatigue Patient left: in bed;with call bell/phone within reach;with bed alarm set  OT Visit Diagnosis: Unsteadiness on feet (R26.81);Other abnormalities of gait and mobility (R26.89);Muscle weakness (generalized) (M62.81)                Time: 9063-8981 OT Time Calculation (min): 42 min Charges:  OT General Charges $OT Visit: 1 Visit OT Evaluation $OT Eval Moderate Complexity: 1 Mod OT Treatments $Self Care/Home Management : 8-22 mins  Lucie Kendall, OTR/L Acute Rehabilitation Services Office [phone] Secure Chat Communication Preferred   Lucie JONETTA Kendall 03/09/2024, 11:30 AM

## 2024-03-09 NOTE — Care Management Obs Status (Signed)
 MEDICARE OBSERVATION STATUS NOTIFICATION   Patient Details  Name: [name] [name] MRN: [id number] Date of Birth: 12-19-1980   Medicare Observation Status Notification Given:  Yes Verbally reviewed observation notice with [name] telephonically at [phone].  Will leave a copy in the patient yellow sleeve on door.    [name] 03/09/2024, 8:24 AM

## 2024-03-09 NOTE — Progress Notes (Addendum)
 HD#0 SUBJECTIVE:  Patient Summary: [name] [name] is a 43 y.o. with a pertinent PMH of ESRD on HD, antiphospholipid antibody syndrome, and previous CVA who presented with chest pain and admitted for hemodialysis and worsening RUE edema.   Overnight Events: none  Interim History: Today, the patient reports feeling better. His cough has improved and has not felt any pain in his chest while being here. Received HD last night, which he believes helped. He is concerned about his right upper arm swelling, and thinks it has worsened overnight. He has been resting it on a pillow to elevate it, but states he has not had it wrapped in an ace bandage yet.  OBJECTIVE:  Vital Signs: Vitals:   03/09/24 0219 03/09/24 0453 03/09/24 0500 03/09/24 0806  BP: (!) 105/59 130/74  136/83  Pulse: 72 70  65  Resp:  18  18  Temp: 98.1 F (36.7 C) 98.2 F (36.8 C)  97.6 F (36.4 C)  TempSrc: Oral Oral  Oral  SpO2: 95% 91%  96%  Weight:   116.1 kg   Height:       Supplemental O2: Nasal Cannula SpO2: 96 % O2 Flow Rate (L/min): 2 L/min  Filed Weights   03/07/24 2322 03/08/24 2215 03/09/24 0500  Weight: 108.9 kg 121.5 kg 116.1 kg     Intake/Output Summary (Last 24 hours) at 03/09/2024 1134 Last data filed at 03/09/2024 0219 Gross per 24 hour  Intake 100 ml  Output 4000 ml  Net -3900 ml   Net IO Since Admission: -3,900 mL [03/09/24 1134]  Physical Exam: Physical Exam Constitutional:      Appearance: He is obese.  Eyes:     Pupils: Pupils are equal, round, and reactive to light.  Cardiovascular:     Rate and Rhythm: Normal rate and regular rhythm.     Pulses:          Radial pulses are 2+ on the right side and 2+ on the left side.     Heart sounds: Normal heart sounds.  Pulmonary:     Effort: Pulmonary effort is normal. No respiratory distress.     Breath sounds: Normal breath sounds.  Chest:     Chest wall: No tenderness.  Abdominal:     General: Bowel sounds are normal.      Palpations: Abdomen is soft.     Tenderness: There is no abdominal tenderness.  Musculoskeletal:     Cervical back: Normal range of motion.     Comments: RUE erythematous, edematous, indurated, warm to the touch without purulence or drainage. Patient was elevating his arm slightly, but no compressive bandages were on the RUE.  Skin:    General: Skin is warm and dry.  Neurological:     General: No focal deficit present.     Mental Status: He is alert.  Psychiatric:        Mood and Affect: Mood normal.     Patient Lines/Drains/Airways Status     Active Line/Drains/Airways     Name Placement date Placement time Site Days   Fistula / Graft Right Upper arm Arteriovenous fistula 12/24/23  0808  Upper arm  76   Hemodialysis Catheter Right 01/09/24  0927  --  60   Wound 12/24/23 0810 Surgical Closed Surgical Incision Arm Right 12/24/23  0810  Arm  76            Pertinent labs and imaging:      Latest Ref Rng & Units 03/08/2024  12:17 AM 03/06/2024   10:01 AM 03/01/2024    2:46 PM  CBC  WBC 4.0 - 10.5 K/uL 8.1  7.6  7.4   Hemoglobin 13.0 - 17.0 g/dL 9.6  9.0  8.8   Hematocrit 39.0 - 52.0 % 30.3  28.4  27.4   Platelets 150 - 400 K/uL 166  145  72        Latest Ref Rng & Units 03/09/2024    4:45 AM 03/08/2024    6:22 PM 03/08/2024   12:17 AM  CMP  Glucose 70 - 99 mg/dL 68  67  90   BUN 6 - 20 mg/dL 54  893  99   Creatinine 0.61 - 1.24 mg/dL 85.19  75.45  77.36   Sodium 135 - 145 mmol/L 135  136  136   Potassium 3.5 - 5.1 mmol/L 4.4  5.3  5.2   Chloride 98 - 111 mmol/L 93  93  92   CO2 22 - 32 mmol/L 25  23  21    Calcium  8.9 - 10.3 mg/dL 9.1  8.7  8.9     No results found.  ASSESSMENT/PLAN:  Assessment: Principal Problem:   Chest pain Active Problems:   Essential hypertension   Seizure, late effect of stroke (HCC)   Macrocytic anemia   ESRD on hemodialysis (HCC)   Chronic hypoxic respiratory failure, on home oxygen therapy (HCC)   Right arm pain    Hypercoagulable state (HCC)   AVF (arteriovenous fistula) (HCC)   Plan:  #RUE Edema #Suspected Cellulitis vs Venous Occlusive Event Ongoing issue since AVF revision in early September. Vascular surgery consulted and following. US  of AVF from 9/19 shows 6.9 x 2.6 x 6.5cm in medial proximal upper arm, possible hematoma. Venous Duplex shows no signs DVT. Will order CT venogram of right humerus and forearm to rule out any central compressive etiologies.  Was discharged on Keflex  for 7 days for suspected cellulitis and completed 3/7 days. Patient still has 2+ pitting edema to fingertips, induration, erythema, and warmth to touch. Will switch to IV Vancomycin  per pharmacy. Patient was supposed to be compressing and elevating the right upper extremity, however he states that he has not been able to have his arm wrapped.  He is minimally elevating his arm with 1 pillow. - Continue elevation, compression of RUE, monitor closely - CT right humerus, forearm ordered  - Stop Keflex  - Start IV Vancomycin  per pharmacy - VVS following   #ESRD on HD MWF #Electrolyte Derangements. Chronic. On HD MWF, received dialysis overnight due to pulm edema. According to pt, missed 2 dialysis sessions last week. CXR showed vascular congestion last night. Need updated standing weight per nephrology. Will have repeat HD today and then will resume outpatient HD schedule.  Calcium  level currently at goal, nephrology ordering phosphorus level to monitor.  Patient is to be on renal diet with fluid restriction. - Continue HD as scheduled - Start Darbe 100mcg weekly on 9/24 - Renal Function Panel, CBC in AM  #Chest Pain, resolved #COVID-19 Patient reports no chest pain today, no reproducible pain to palpation on exam. Troponins elevated on admission but at baseline and flat. Patient is denies persistent coughing. Maintaining 96% on 2L [place]. Patient received HD last night, nephrology suspecting CP due to volume overload/pulmonary  edema.  -- Consider antitussives if coughing worsens -- Wean O2 as tolerated  #Antiphospholipid Syndrome #Hx of CVAs, multiple Chronic. Previously followed with hematology. Currently on Eliquis  5mg  BID, but previously nonadherent. Better adherence  now, but suboptimal treatment on Eliquis . Has been started on Warfarin multiple times previously, but due to nonadherence was switched to Eliquis .  Pending return to SNF, may be able to restart warfarin for optimal treatment.  Additionally, SNF would be able to check INR regularly.  Will pend PT OT recommendations before switching anticoagulant. - Pending PT and OT recommendations.  - Continue Eliquis  5mg  - Consider starting Warfarin if returning to SNF  # Macrocytic Anemia  Secondary to chronic ESRD. Hgb today 9.6. Nephrology recommends starting Darbe on 9/24 at 100mcg weekly sq to chronically low hemoglobin and hematocrit. Vitamin B12 elevated at 948, folate normal at 14.1. Suspect reduced renal clearance of vitamin B12 given ESRD. - Monitor CBC  #Seizure Disorder Last seizure 10/25/2023. Previously nonadherent to medication, but since living in SNF has better adherence.  - Continue home Keppra  500mg   #Hypertension BP today 130/74. Improving from admission. - Continue losartan  50mg  daily and Toprol  XL 25mg  daily.  Best Practice: Diet: Renal diet, with fluid restriction VTE: Heparin , DOAC Code: Full  Disposition planning: Therapy Recs: SNF, living long term DISPO: Anticipated discharge in 1-2 days to Skilled nursing facility pending HD, PT/OT recommendations, and clinical improvement.   Ikey Omary, DO Internal Medicine Resident, PGY-1 Please contact the on call pager at [phone] for any urgent or emergent needs. 11:34 AM 03/09/2024

## 2024-03-09 NOTE — Progress Notes (Signed)
 Patient ID: [name] [name], male   DOB: August 04, 1980, 43 y.o.   MRN: [id number] [facility] Progress Note   Assessment/ Plan:   1.  Chest pain: Suspect that this was largely related to volume overload/pulmonary edema with negative markers for ACS.  Will undertake repeat hemodialysis today for continued clearance/volume and to resume outpatient schedule. 2. ESRD: Usually on MWF dialysis schedule and underwent hemodialysis emergently yesterday for management of volume overload.  Will order for dialysis today to resume outpatient schedule. 3. Anemia: Low hemoglobin/hematocrit, resume ESA if still here on 9/24. 4. CKD-MBD: Calcium  level currently at goal, will order phosphorus level to be done with today's labs. 5. Nutrition: Continue renal diet with fluid restriction 6. Hypertension: Blood pressure marginally elevated, monitor with hemodialysis/UF  Subjective:   Denies any chest pain today.  Does not recall having dialysis yesterday   Objective:   BP 136/83 (BP Location: Right Leg)   Pulse 65   Temp 97.6 F (36.4 C) (Oral)   Resp 18   Ht 6' (1.829 m)   Wt 116.1 kg   SpO2 96%   BMI 34.71 kg/m   Physical Exam: Gen: Appears fatigued but comfortable resting in bed, awakens to voice/conversation CVS: Pulse regular rhythm, normal rate, S1 and S2 normal Resp: Clear to auscultation bilaterally, no distinct rales or rhonchi Abd: Soft, obese, nontender, bowel sounds normal Ext: Trace bilateral lower extremity edema.  Significantly larger right upper extremity with some skin blistering over dorsum of hand.  Labs: BMET Recent Labs  Lab 03/06/24 1001 03/08/24 0017 03/08/24 1822 03/09/24 0445  NA 136 136 136 135  K 4.8 5.2* 5.3* 4.4  CL 94* 92* 93* 93*  CO2 26 21* 23 25  GLUCOSE 73 90 67* 68*  BUN 78* 99* 106* 54*  CREATININE 20.23* 22.63* 24.54* 14.80*  CALCIUM  8.8* 8.9 8.7* 9.1   CBC Recent Labs  Lab 03/06/24 1001 03/08/24 0017  WBC 7.6 8.1  NEUTROABS 5.1  --    HGB 9.0* 9.6*  HCT 28.4* 30.3*  MCV 108.4* 107.4*  PLT 145* 166     Medications:     acetaminophen   1,000 mg Oral Q6H WA   apixaban   5 mg Oral BID   aspirin   81 mg Oral Daily   atorvastatin   40 mg Oral Daily   cephALEXin   500 mg Oral Q12H   [START ON 03/11/2024] darbepoetin (ARANESP ) injection - DIALYSIS  100 mcg Subcutaneous Q Wed-1800   divalproex   1,000 mg Oral Q12H   levETIRAcetam   500 mg Oral BID   losartan   50 mg Oral Daily   metoprolol  succinate  25 mg Oral Daily   multivitamin with minerals  1 tablet Oral Daily   Gordy Blanch, MD 03/09/2024, 8:35 AM

## 2024-03-09 NOTE — Progress Notes (Signed)
 Inpatient Rehab Admissions Coordinator:   Noted pt is observation status at this time. Pt may not have the medical necessity to warrant an inpatient rehab stay if they remain observation. If pt were to qualify for inpatient status, AC will screen for candidacy.  Reche Lowers, PT, DPT Admissions Coordinator [phone] 03/09/24  3:44 PM

## 2024-03-09 NOTE — Progress Notes (Signed)
 Pt receives out-pt HD at [facility], MWF, 9294 chair time. Will continue to assist as needed.   Carl Butner Dialysis Navigator [phone]

## 2024-03-09 NOTE — Plan of Care (Signed)

## 2024-03-09 NOTE — Evaluation (Signed)
 Physical Therapy Evaluation Patient Details Name: [name] [name] MRN: [id number] DOB: November 19, 1980 Today's Date: 03/09/2024  History of Present Illness  [name] [name] is a 43 y.o. male who presented from [facility] with chest pain. He was recently treated for COVID-19. PMH includes ESRD on hemodialysis, AV fistula placement (Right, 12/24/2023), HTN, Lupus, Seizures (May 2025), Stroke (May 2025), and Wears glasses.  Clinical Impression  Patient presents with decreased mobility due to generalized weakness, decreased balance, decreased safety and deficit awareness with R side inattention and R side weakness from prior stoke.  Patient incontinent of stool going to bathroom and needing A due to decreased proximity to walker and high risk for falls.  If home would need capable 24 hour assist, though current recommendation is to consider intensive inpatient rehab prior to d/c home with family support.         If plan is discharge home, recommend the following: A lot of help with walking and/or transfers;A little help with bathing/dressing/bathroom;Help with stairs or ramp for entrance;Assist for transportation;Assistance with cooking/housework   Can travel by private vehicle        Equipment Recommendations None recommended by PT  Recommendations for Other Services  Rehab consult    Functional Status Assessment Patient has had a recent decline in their functional status and demonstrates the ability to make significant improvements in function in a reasonable and predictable amount of time.     Precautions / Restrictions Precautions Precautions: Fall Recall of Precautions/Restrictions: Intact Precaution/Restrictions Comments: watch O2      Mobility  Bed Mobility Overal bed mobility: Needs Assistance Bed Mobility: Supine to Sit, Sit to Supine     Supine to sit: Mod assist Sit to supine: Min assist   General bed mobility comments: mod A to bring L hip to the EOB     Transfers Overall transfer level: Needs assistance Equipment used: Rolling walker (2 wheels) Transfers: Sit to/from Stand Sit to Stand: Min assist           General transfer comment: min A to power up and for balance, cues for RW management    Ambulation/Gait Ambulation/Gait assistance: Min assist, Mod assist Gait Distance (Feet): 12 Feet (x 2) Assistive device: Rolling walker (2 wheels) Gait Pattern/deviations: Step-to pattern, Step-through pattern, Decreased stride length, Wide base of support, Shuffle       General Gait Details: needing frequent cues and A For walker proximity, assist in room to bathroom with pt incontinent of stool, then back to EOB needing O2 with SpO2 drop during ambulation on RA. assist for changing socks and pt able to perform hygiene standing with OT  Stairs            Wheelchair Mobility     Tilt Bed    Modified Rankin (Stroke Patients Only)       Balance Overall balance assessment: Needs assistance Sitting-balance support: Feet supported Sitting balance-Leahy Scale: Poor Sitting balance - Comments: a few posterior LOBs noted   Standing balance support: No upper extremity supported, During functional activity Standing balance-Leahy Scale: Poor Standing balance comment: min A for static standing at the sink                             Pertinent Vitals/Pain Pain Assessment Pain Assessment: Faces Faces Pain Scale: Hurts a little bit Pain Location: RUE with movement and to touch Pain Descriptors / Indicators: Discomfort, Guarding Pain Intervention(s): Monitored during session, Premedicated before  session, Limited activity within patient's tolerance    Home Living Family/patient expects to be discharged to:: Private residence Living Arrangements: Parent Available Help at Discharge: Family;Available 24 hours/day Type of Home: House Home Access: Ramped entrance       Home Layout: One level Home Equipment:  None Additional Comments: Pt presented from SNF, he states the plan is to d/c to his moms house.    Prior Function Prior Level of Function : Needs assist             Mobility Comments: RW at SNF, he somtimes uses the [facility] ADLs Comments: staff assists with LB dressing and bathing, otherwise mod I in the room     Extremity/Trunk Assessment   Upper Extremity Assessment Upper Extremity Assessment: Defer to OT evaluation    Lower Extremity Assessment Lower Extremity Assessment: Generalized weakness;RLE deficits/detail RLE Deficits / Details: generalized weakness, though weaker than L and difficulty holding and some inattention R leg for A with donning socks    Cervical / Trunk Assessment Cervical / Trunk Assessment: Normal  Communication   Communication Communication: No apparent difficulties    Cognition Arousal: Alert Behavior During Therapy: Flat affect                             Following commands: Impaired       Cueing Cueing Techniques: Verbal cues, Gestural cues, Visual cues, Tactile cues     General Comments General comments (skin integrity, edema, etc.): SpO2 down to 80's on RA with walking to bathroom, up to 91% after on 2L O2 [place] x 1 minute    Exercises     Assessment/Plan    PT Assessment Patient needs continued PT services  PT Problem List Decreased strength;Decreased cognition;Decreased activity tolerance;Decreased balance;Cardiopulmonary status limiting activity;Decreased mobility;Decreased knowledge of use of DME;Decreased knowledge of precautions;Decreased coordination       PT Treatment Interventions DME instruction;Gait training;Functional mobility training;Therapeutic activities;Therapeutic exercise;Balance training;Patient/family education;Wheelchair mobility training    PT Goals (Current goals can be found in the Care Plan section)  Acute Rehab PT Goals Patient Stated Goal: to go home with his mother PT Goal Formulation: Patient  unable to participate in goal setting Time For Goal Achievement: 03/23/24 Potential to Achieve Goals: Fair    Frequency Min 3X/week     Co-evaluation PT/OT/SLP Co-Evaluation/Treatment: Yes Reason for Co-Treatment: For patient/therapist safety PT goals addressed during session: Mobility/safety with mobility;Proper use of DME OT goals addressed during session: ADL's and self-care       AM-PAC PT 6 Clicks Mobility  Outcome Measure Help needed turning from your back to your side while in a flat bed without using bedrails?: A Little Help needed moving from lying on your back to sitting on the side of a flat bed without using bedrails?: A Lot Help needed moving to and from a bed to a chair (including a wheelchair)?: A Lot Help needed standing up from a chair using your arms (e.g., wheelchair or bedside chair)?: A Lot Help needed to walk in hospital room?: Total Help needed climbing 3-5 steps with a railing? : Total 6 Click Score: 11    End of Session Equipment Utilized During Treatment: Gait belt;Oxygen Activity Tolerance: Patient tolerated treatment well Patient left: in bed;with call bell/phone within reach Nurse Communication: Mobility status PT Visit Diagnosis: Other abnormalities of gait and mobility (R26.89);Muscle weakness (generalized) (M62.81)    Time: 0950-1020 PT Time Calculation (min) (ACUTE ONLY): 30 min  Charges:   PT Evaluation $PT Eval Moderate Complexity: 1 Mod   PT General Charges $$ ACUTE PT VISIT: 1 Visit         [name] [name], PT Acute Rehabilitation Services Office:[phone] 03/09/2024   [name] [name] 03/09/2024, 2:00 PM

## 2024-03-09 NOTE — Progress Notes (Addendum)
  Progress Note    03/09/2024 7:58 AM * No surgery found *  Subjective:  no complaints    Vitals:   03/09/24 0219 03/09/24 0453  BP: (!) 105/59 130/74  Pulse: 72 70  Resp:  18  Temp: 98.1 F (36.7 C) 98.2 F (36.8 C)  SpO2: 95% 91%    Physical Exam: General:  laying in bed, NAD Lungs:  nonlabored, on supplemental oxygen Incisions:  RUE incisions intact and dry Extremities:  mostly unchanged hematoma and swelling of RUE   CBC    Component Value Date/Time   WBC 8.1 03/08/2024 0017   RBC 2.82 (L) 03/08/2024 0017   HGB 9.6 (L) 03/08/2024 0017   HGB 13.0 09/27/2020 1140   HGB 15.5 10/14/2013 1434   HCT 30.3 (L) 03/08/2024 0017   HCT 38.5 09/27/2020 1140   HCT 45.6 10/14/2013 1434   PLT 166 03/08/2024 0017   PLT 69 (LL) 09/27/2020 1140   MCV 107.4 (H) 03/08/2024 0017   MCV 96 09/27/2020 1140   MCV 87.2 10/14/2013 1434   MCH 34.0 03/08/2024 0017   MCHC 31.7 03/08/2024 0017   RDW 14.0 03/08/2024 0017   RDW 14.6 09/27/2020 1140   RDW 13.0 10/14/2013 1434   LYMPHSABS 1.2 03/06/2024 1001   LYMPHSABS 1.2 09/27/2020 1140   LYMPHSABS 1.4 10/14/2013 1434   MONOABS 1.0 03/06/2024 1001   MONOABS 0.5 10/14/2013 1434   EOSABS 0.2 03/06/2024 1001   EOSABS 0.2 09/27/2020 1140   BASOSABS 0.0 03/06/2024 1001   BASOSABS 0.0 09/27/2020 1140   BASOSABS 0.0 10/14/2013 1434    BMET    Component Value Date/Time   NA 135 03/09/2024 0445   NA 138 09/27/2020 1140   K 4.4 03/09/2024 0445   CL 93 (L) 03/09/2024 0445   CO2 25 03/09/2024 0445   GLUCOSE 68 (L) 03/09/2024 0445   BUN 54 (H) 03/09/2024 0445   BUN 50 (H) 09/27/2020 1140   CREATININE 14.80 (H) 03/09/2024 0445   CREATININE 15.72 (H) 01/24/2021 0938   CALCIUM  9.1 03/09/2024 0445   CALCIUM  8.4 (L) 12/23/2017 0237   GFRNONAA 4 (L) 03/09/2024 0445   GFRNONAA 40 (L) 10/24/2015 1603   GFRAA 4 (L) 01/14/2020 2020   GFRAA 47 (L) 10/24/2015 1603    INR    Component Value Date/Time   INR 2.5 (H) 04/20/2023 0532    INR 2.40 11/04/2013 1457     Intake/Output Summary (Last 24 hours) at 03/09/2024 0758 Last data filed at 03/09/2024 0219 Gross per 24 hour  Intake 100 ml  Output 4000 ml  Net -3900 ml      Assessment/Plan:  43 y.o. male admitted with chest pain   -He is doing okay this morning without any complaints -He had dialysis last night after missing HD 2x last week -RUE incisions remain intact and dry -Hematoma and swelling of RUE unchanged. Continue ace wrap and elevation of the arm. May consider CT venogram if conservative therapy does not help   Ahmed Holster, PA-C [facility] [phone] 03/09/2024 7:58 AM   I have independently interviewed and examined patient and agree with PA assessment and plan above.  Hand is currently elevated and appears somewhat improved.  He will need Ace wrap from the hand all the way to the elbow.  CT venogram right upper extremity to evaluate for causes of edema with fistula in place.  Samhitha Rosen C. Sheree, MD [facility] [place] Office: [phone] Pager: [phone]

## 2024-03-09 NOTE — Inpatient Diabetes Management (Signed)
 Inpatient Diabetes Program Recommendations  AACE/ADA: New Consensus Statement on Inpatient Glycemic Control (2015)  Target Ranges:  Prepandial:   less than 140 mg/dL      Peak postprandial:   less than 180 mg/dL (1-2 hours)      Critically ill patients:  140 - 180 mg/dL   Lab Results  Component Value Date   GLUCAP 94 10/22/2023   HGBA1C 5.6 10/27/2023    Review of Glycemic Control  Latest Reference Range & Units 08/01/23 16:24 10/18/23 07:56 10/20/23 17:35 10/20/23 19:45 10/20/23 20:42 10/22/23 11:23  Glucose-Capillary 70 - 99 mg/dL 86 75 89 68 (L) 92 94  (L): Data is abnormally low Diabetes history: PreDM Outpatient Diabetes medications: none Current orders for Inpatient glycemic control: none  Inpatient Diabetes Program Recommendations:    Mild lows this AM in setting of ESRD. Consider adding CBGs TID.   Thanks, Tinnie Minus, MSN, RNC-OB Diabetes Coordinator [phone] (8a-5p)

## 2024-03-09 NOTE — Progress Notes (Signed)
 PHARMACY ANTIBIOTIC CONSULT NOTE   [name] [name] a 43 y.o. male admitted with chest pain and shortness of breath who also has suspected cellulitis. Patient has ESRD and is on HD MWF. Pharmacy has been consulted for Vancomycin  dosing.  Scr 14.80 (03/09/2024), WBC 8.1 (03/08/2024) Vital Signs: Afebrile, HR normal, BP normal  Estimated Creatinine Clearance: 8.4 mL/min (A) (by C-G formula based on SCr of 14.8 mg/dL (H)).  Plan: GIVE Vancomycin  2000 mg IV x1 (Wt used: 116.1 kg)  THEN Vancomycin  1000 mg IV with HD MWF F/U HD schedule/tolerability, clinical status, de-escalation, C/S, levels as indicated   Allergies:  Allergies  Allergen Reactions   Cetirizine & Related Swelling   Hibiclens  [Chlorhexidine  Gluconate] Itching    Filed Weights   03/07/24 2322 03/08/24 2215 03/09/24 0500  Weight: 108.9 kg (240 lb) 121.5 kg (267 lb 13.7 oz) 116.1 kg (255 lb 15.3 oz)       Latest Ref Rng & Units 03/08/2024   12:17 AM 03/06/2024   10:01 AM 03/01/2024    2:46 PM  CBC  WBC 4.0 - 10.5 K/uL 8.1  7.6  7.4   Hemoglobin 13.0 - 17.0 g/dL 9.6  9.0  8.8   Hematocrit 39.0 - 52.0 % 30.3  28.4  27.4   Platelets 150 - 400 K/uL 166  145  72     Antibiotics Given (last 72 hours)     Date/Time Action Medication Dose   03/08/24 1108 Given   cephALEXin  (KEFLEX ) capsule 500 mg 500 mg   03/08/24 2148 Given   cephALEXin  (KEFLEX ) capsule 500 mg 500 mg   03/09/24 0948 Given   cephALEXin  (KEFLEX ) capsule 500 mg 500 mg       Antimicrobials this admission: Keflex  9/21>>9/22 Vancomycin  9/22>>  Microbiology results: 9/19 COVID (+)  Thank you for allowing pharmacy to be involved with this patient's care.  Mendel Barter, PharmD PGY1 Clinical Pharmacist [facility]  03/09/2024 11:32 AM

## 2024-03-10 DIAGNOSIS — D631 Anemia in chronic kidney disease: Secondary | ICD-10-CM | POA: Diagnosis not present

## 2024-03-10 DIAGNOSIS — N186 End stage renal disease: Secondary | ICD-10-CM | POA: Diagnosis not present

## 2024-03-10 DIAGNOSIS — I120 Hypertensive chronic kidney disease with stage 5 chronic kidney disease or end stage renal disease: Secondary | ICD-10-CM | POA: Diagnosis not present

## 2024-03-10 DIAGNOSIS — L03113 Cellulitis of right upper limb: Secondary | ICD-10-CM | POA: Diagnosis not present

## 2024-03-10 LAB — CBC
HCT: 33.1 % — ABNORMAL LOW (ref 39.0–52.0)
Hemoglobin: 10.6 g/dL — ABNORMAL LOW (ref 13.0–17.0)
MCH: 33.8 pg (ref 26.0–34.0)
MCHC: 32 g/dL (ref 30.0–36.0)
MCV: 105.4 fL — ABNORMAL HIGH (ref 80.0–100.0)
Platelets: 236 K/uL (ref 150–400)
RBC: 3.14 MIL/uL — ABNORMAL LOW (ref 4.22–5.81)
RDW: 13.7 % (ref 11.5–15.5)
WBC: 8.3 K/uL (ref 4.0–10.5)
nRBC: 0 % (ref 0.0–0.2)

## 2024-03-10 LAB — RENAL FUNCTION PANEL
Albumin: 2.9 g/dL — ABNORMAL LOW (ref 3.5–5.0)
Anion gap: 15 (ref 5–15)
BUN: 36 mg/dL — ABNORMAL HIGH (ref 6–20)
CO2: 22 mmol/L (ref 22–32)
Calcium: 9.2 mg/dL (ref 8.9–10.3)
Chloride: 94 mmol/L — ABNORMAL LOW (ref 98–111)
Creatinine, Ser: 10.74 mg/dL — ABNORMAL HIGH (ref 0.61–1.24)
GFR, Estimated: 6 mL/min — ABNORMAL LOW (ref >60)
Glucose, Bld: 114 mg/dL — ABNORMAL HIGH (ref 70–99)
Phosphorus: 5 mg/dL — ABNORMAL HIGH (ref 2.5–4.6)
Potassium: 4 mmol/L (ref 3.5–5.1)
Sodium: 131 mmol/L — ABNORMAL LOW (ref 135–145)

## 2024-03-10 LAB — HEPATITIS B SURFACE ANTIBODY, QUANTITATIVE: Hep B S AB Quant (Post): 6375 m[IU]/mL

## 2024-03-10 MED ORDER — LIDOCAINE-PRILOCAINE 2.5-2.5 % EX CREA: 1.0000 | TOPICAL_CREAM | CUTANEOUS | Status: DC | PRN

## 2024-03-10 MED ORDER — LIDOCAINE HCL (PF) 1 % IJ SOLN: 5.0000 mL | INTRAMUSCULAR | Status: DC | PRN

## 2024-03-10 MED ORDER — HEPARIN SODIUM (PORCINE) 1000 UNIT/ML DIALYSIS: 40.0000 [IU]/kg | INTRAMUSCULAR | Status: DC | PRN

## 2024-03-10 MED ORDER — VANCOMYCIN HCL IN DEXTROSE 1-5 GM/200ML-% IV SOLN
1000.0000 mg | Freq: Once | INTRAVENOUS | Status: AC
  Administered 2024-03-10: 1000 mg via INTRAVENOUS
  Filled 2024-03-10: qty 200

## 2024-03-10 MED ORDER — PENTAFLUOROPROP-TETRAFLUOROETH EX AERO: 1.0000 | INHALATION_SPRAY | CUTANEOUS | Status: DC | PRN

## 2024-03-10 NOTE — Progress Notes (Signed)
  Progress Note    03/10/2024 2:34 PM * No surgery found *  Subjective: States that his arm is better  Vitals:   03/10/24 0804 03/10/24 1252  BP: 130/62 (!) 150/80  Pulse: 75 76  Resp: 18   Temp: 98.1 F (36.7 C)   SpO2:  90%    Physical Exam: Awake and alert Nonlabored respirations Right upper extremity edema much improved Strong thrill right upper arm  CBC    Component Value Date/Time   WBC 8.3 03/10/2024 1014   RBC 3.14 (L) 03/10/2024 1014   HGB 10.6 (L) 03/10/2024 1014   HGB 13.0 09/27/2020 1140   HGB 15.5 10/14/2013 1434   HCT 33.1 (L) 03/10/2024 1014   HCT 38.5 09/27/2020 1140   HCT 45.6 10/14/2013 1434   PLT 236 03/10/2024 1014   PLT 69 (LL) 09/27/2020 1140   MCV 105.4 (H) 03/10/2024 1014   MCV 96 09/27/2020 1140   MCV 87.2 10/14/2013 1434   MCH 33.8 03/10/2024 1014   MCHC 32.0 03/10/2024 1014   RDW 13.7 03/10/2024 1014   RDW 14.6 09/27/2020 1140   RDW 13.0 10/14/2013 1434   LYMPHSABS 1.2 03/06/2024 1001   LYMPHSABS 1.2 09/27/2020 1140   LYMPHSABS 1.4 10/14/2013 1434   MONOABS 1.0 03/06/2024 1001   MONOABS 0.5 10/14/2013 1434   EOSABS 0.2 03/06/2024 1001   EOSABS 0.2 09/27/2020 1140   BASOSABS 0.0 03/06/2024 1001   BASOSABS 0.0 09/27/2020 1140   BASOSABS 0.0 10/14/2013 1434    BMET    Component Value Date/Time   NA 131 (L) 03/10/2024 1014   NA 138 09/27/2020 1140   K 4.0 03/10/2024 1014   CL 94 (L) 03/10/2024 1014   CO2 22 03/10/2024 1014   GLUCOSE 114 (H) 03/10/2024 1014   BUN 36 (H) 03/10/2024 1014   BUN 50 (H) 09/27/2020 1140   CREATININE 10.74 (H) 03/10/2024 1014   CREATININE 15.72 (H) 01/24/2021 0938   CALCIUM  9.2 03/10/2024 1014   CALCIUM  8.4 (L) 12/23/2017 0237   GFRNONAA 6 (L) 03/10/2024 1014   GFRNONAA 40 (L) 10/24/2015 1603   GFRAA 4 (L) 01/14/2020 2020   GFRAA 47 (L) 10/24/2015 1603    INR    Component Value Date/Time   INR 2.5 (H) 04/20/2023 0532   INR 2.40 11/04/2013 1457     Intake/Output Summary (Last 24  hours) at 03/10/2024 1434 Last data filed at 03/10/2024 0804 Gross per 24 hour  Intake 100 ml  Output 3000 ml  Net -2900 ml     Assessment/plan:  43 y.o. male with end-stage renal disease recent right upper extremity second stage basilic vein fistula creation.  Admitted with chest pain with considerable right upper extremity edema.  No fistulogram performed due to fistula revision only 2 weeks ago.  CT did not demonstrate any central venous occlusive processes and the fistula is widely patent.  Given that his arm is much improved he can keep his regularly scheduled follow-up with us  in a couple weeks and at that time if he requires fistulogram the fistula should be mature enough to proceed.    Aleric Froelich C. Sheree, MD [facility] [place] Office: [phone] Pager: [phone]  03/10/2024 2:34 PM

## 2024-03-10 NOTE — Progress Notes (Signed)
   03/10/24 0804  Vitals  Temp 98.1 F (36.7 C)  Pulse Rate 75  Resp 18  BP 130/62  SpO2  (no probe in place---nephrology made aware)  O2 Device Room Air  Weight 113.1 kg  Type of Weight Post-Dialysis  Oxygen Therapy  Patient Activity (if Appropriate) In bed  Pulse Oximetry Type Continuous  Oximetry Probe Site Changed No  Post Treatment  Dialyzer Clearance Lightly streaked  Hemodialysis Intake (mL) 0 mL  Liters Processed 73.3  Fluid Removed (mL) 3000 mL  Tolerated HD Treatment Yes   Pt done at the bedside---COVVID + Alert and oriented. TX duration:3.75  Patient tolerated well.  Alert, without acute distress.  Hand-off given to patient's nurse.   Access used: [facility] Access issues: none for me-----Night shift nurse said pt clotted off once for her---will ask Dr. Tobie for any potential heparin  orders  Total UF removed: 3000 Medication(s) given: none for me   [name] [name]

## 2024-03-10 NOTE — Plan of Care (Signed)

## 2024-03-10 NOTE — Progress Notes (Signed)
 HD#1 SUBJECTIVE:  Patient Summary: [name] [name] is a 43 y.o. with a pertinent PMH of ESRD on HD, antiphospholipid antibody syndrome, and previous CVA who presented with chest pain and admitted for hemodialysis and worsening RUE edema.   Overnight Events: none  Interim History: Today, the patient reports he is feeling better than he had when he was admitted. His cough has improved and is not coughing up much phlegm. He received HD at bedside this morning and denies any SOB, fevers, or distress. Discussed PT/OT evaluation, and the patient is agreeable to CIR if approved to rehabilitate before discharging to his mom's house.  OBJECTIVE:  Vital Signs: Vitals:   03/10/24 0715 03/10/24 0730 03/10/24 0758 03/10/24 0804  BP: 128/62 133/65 (!) 135/102 130/62  Pulse: 75 75 91 75  Resp:   20 18  Temp:    98.1 F (36.7 C)  TempSrc:      SpO2:      Weight:    113.1 kg  Height:       Supplemental O2: Nasal Cannula SpO2:  (no probe in place---nephrology made aware) O2 Flow Rate (L/min): 2 L/min  Filed Weights   03/09/24 0500 03/10/24 0150 03/10/24 0804  Weight: 116.1 kg 116.1 kg 113.1 kg     Intake/Output Summary (Last 24 hours) at 03/10/2024 0905 Last data filed at 03/10/2024 0804 Gross per 24 hour  Intake 100 ml  Output 3000 ml  Net -2900 ml   Net IO Since Admission: -6,800 mL [03/10/24 0905]  Physical Exam: Physical Exam Constitutional:      Appearance: He is obese.  Eyes:     Pupils: Pupils are equal, round, and reactive to light.  Cardiovascular:     Rate and Rhythm: Normal rate and regular rhythm.     Pulses:          Radial pulses are 2+ on the right side and 2+ on the left side.     Heart sounds: Normal heart sounds.  Pulmonary:     Effort: Pulmonary effort is normal. No respiratory distress.     Breath sounds: Normal breath sounds.  Chest:     Chest wall: No tenderness.  Abdominal:     General: Bowel sounds are normal.     Palpations: Abdomen is soft.      Tenderness: There is no abdominal tenderness.  Musculoskeletal:     Cervical back: Normal range of motion.     Comments: RUE erythematous, edematous, indurated, warm to the touch without purulence or drainage. Patient was elevating his arm slightly, but no compressive bandages were on the RUE.  Skin:    General: Skin is warm and dry.  Neurological:     General: No focal deficit present.     Mental Status: He is alert.  Psychiatric:        Mood and Affect: Mood normal.     Patient Lines/Drains/Airways Status     Active Line/Drains/Airways     Name Placement date Placement time Site Days   Fistula / Graft Right Upper arm Arteriovenous fistula 12/24/23  0808  Upper arm  76   Hemodialysis Catheter Right 01/09/24  0927  --  60   Wound 12/24/23 0810 Surgical Closed Surgical Incision Arm Right 12/24/23  0810  Arm  76            Pertinent labs and imaging:      Latest Ref Rng & Units 03/08/2024   12:17 AM 03/06/2024   10:01 AM 03/01/2024  2:46 PM  CBC  WBC 4.0 - 10.5 K/uL 8.1  7.6  7.4   Hemoglobin 13.0 - 17.0 g/dL 9.6  9.0  8.8   Hematocrit 39.0 - 52.0 % 30.3  28.4  27.4   Platelets 150 - 400 K/uL 166  145  72        Latest Ref Rng & Units 03/09/2024    4:45 AM 03/08/2024    6:22 PM 03/08/2024   12:17 AM  CMP  Glucose 70 - 99 mg/dL 68  67  90   BUN 6 - 20 mg/dL 54  893  99   Creatinine 0.61 - 1.24 mg/dL 85.19  75.45  77.36   Sodium 135 - 145 mmol/L 135  136  136   Potassium 3.5 - 5.1 mmol/L 4.4  5.3  5.2   Chloride 98 - 111 mmol/L 93  93  92   CO2 22 - 32 mmol/L 25  23  21    Calcium  8.9 - 10.3 mg/dL 9.1  8.7  8.9     No results found.  ASSESSMENT/PLAN:  Assessment: Principal Problem:   Chest pain Active Problems:   Essential hypertension   ESRD (end stage renal disease) on dialysis (HCC)   Seizure, late effect of stroke (HCC)   Macrocytic anemia   ESRD on hemodialysis (HCC)   Chronic hypoxic respiratory failure, on home oxygen therapy (HCC)   Right arm  pain   Hypercoagulable state   AVF (arteriovenous fistula)   Cellulitis of right upper extremity  [name] [name] is a 43 y.o. with a pertinent PMH of ESRD on HD, antiphospholipid antibody syndrome, and previous CVA who presented with chest pain and admitted for hemodialysis and worsening RUE edema.   Plan:  #RUE Edema #Suspected Cellulitis vs Venous Occlusive Event Ongoing issue since AVF revision in early September. Vascular surgery consulted and following. Continuing IV Vancomycin  (day 2) for concerns of cellulitis. VVS encouraging compression. Pending CT Venogram results. Patient RUE unchanged from admission. Patient was supposed to be compressing and elevating the right upper extremity, however his arm is still not receiving compression.    - Continue elevation, compression of RUE, monitor closely - Pending CT  - IV Vancomycin  per pharmacy (day 2) - VVS following  #Physical Deconditioning PT and OT consulted yesterday to assess if patient requires continued assistance at long term care facility, or if he is able to discharge to home. Both recommend CIR before discharging home. Rehab admission coordinator consulted and will screen for candidacy today now that patient is inpatient status. OT additionally recommending rollator walker and bedside commode if patient is to discharge home. PT and OT will continue to see patient while in acute hospital setting. Discussed with patient today the importance of rehabilitating to improve his chances of needing readmission.  - Appreciate PT/OT recommendations - Pending CIR approval   #ESRD on HD MWF #Electrolyte Derangements. Chronic. On HD MWF, received dialysis this morning at bedside. Patient to resume outpatient HD schedule tomorrow. Patient is to be on renal diet with fluid restriction. Phosphorus and electrolytes this morning still pending.   - Continue HD as scheduled - Start Darbe 100mcg weekly on 9/24 - Renal Function Panel, CBC in  AM  #Chest Pain, resolved #COVID-19 Patient reports no chest pain today, no reproducible pain to palpation on exam. Troponins elevated on admission but at baseline and flat. Patient is denies persistent coughing. Maintaining 97% on 2L [place]. -- Consider antitussives if coughing worsens -- Wean O2 as  tolerated  #Antiphospholipid Syndrome #Hx of CVAs, multiple Chronic. Previously followed with hematology. Currently on Eliquis  5mg  BID, but previously nonadherent. Better adherence now, but suboptimal treatment on Eliquis . Has been started on Warfarin multiple times previously, but due to nonadherence was switched to Eliquis .  Patient wants to be discharged to home and not return to SNF. PT OT recommending CIR before discharge, will keep on Eliquis  for now.  - Continue Eliquis  5mg   # Macrocytic Anemia  Secondary to chronic ESRD. Hgb today pending. Nephrology recommends starting Darbe on 9/24 at 100mcg weekly sq to chronically low hemoglobin and hematocrit. Vitamin B12 elevated at 948, folate normal at 14.1. Suspect reduced renal clearance of vitamin B12 given ESRD. - Monitor CBC  #Seizure Disorder Last seizure 10/25/2023. Previously nonadherent to medication, but since living in SNF has better adherence.  - Continue home Keppra  500mg   #Hypertension BP today 130/62. Improving from admission. - Continue losartan  50mg  daily and Toprol  XL 25mg  daily.  Best Practice: Diet: Renal diet, with fluid restriction VTE: Heparin , DOAC Code: Full  Disposition planning: Therapy Recs: SNF, living long term DISPO: Anticipated discharge in 2-3 days to Skilled nursing facility pending HD, PT/OT recommendations, and clinical improvement.  Signature:  Cristoval Teall, DO Internal Medicine Resident, PGY-1 Please contact the on call pager at [phone] for any urgent or emergent needs. 9:05 AM 03/10/2024

## 2024-03-10 NOTE — Progress Notes (Signed)
 Mobility Specialist: Progress Note   03/10/24 1500  Mobility  Activity Ambulated with assistance  Level of Assistance Minimal assist, patient does 75% or more  Assistive Device Front wheel walker  Distance Ambulated (ft) 30 ft  Activity Response Tolerated well  Mobility Referral Yes  Mobility visit 1 Mobility  Mobility Specialist Start Time (ACUTE ONLY) 1125  Mobility Specialist Stop Time (ACUTE ONLY) 1146  Mobility Specialist Time Calculation (min) (ACUTE ONLY) 21 min    Pt received in chair, agreeable to mobility session. MinA for STS. MinA for ambulation to steady. Ambulated to the door and back around the bed. Started to c/o bil leg weakness and needed to sit immediately after room ambulation. Requested to return back to bed. Able to scoot towards HOB with minA to CGA. MinA for sit>supine to assist BLEs. Left in bed with all needs met, call bell in reach.   Ileana Lute Mobility Specialist Please contact via SecureChat or Rehab office at [phone]

## 2024-03-10 NOTE — Progress Notes (Addendum)
   Inpatient Rehabilitation Admissions Coordinator   Noted patient from short term rehab at [facility] care since 11/12/23. Not a candidate for CIR level rehab. TOC made aware.  Heron Leavell, RN, MSN Rehab Admissions Coordinator [phone] 03/10/2024 12:17 PM

## 2024-03-10 NOTE — Procedures (Signed)
 Patient seen on Hemodialysis. BP (!) 135/102   Pulse 91   Temp 98 F (36.7 C) (Oral)   Resp 20   Ht 6' (1.829 m)   Wt 116.1 kg   SpO2 97%   BMI 34.71 kg/m   QB 300, UF goal 3L Tolerating treatment without complaints at this time.   Gordy Blanch MD [facility] Office # [phone] Pager # [phone] 7:59 AM

## 2024-03-10 NOTE — Progress Notes (Signed)
 Shift RN obtained orders to place patient on telemetry. Patient will need telemetry for Hemodialysis procedures.

## 2024-03-11 ENCOUNTER — Inpatient Hospital Stay (HOSPITAL_COMMUNITY)

## 2024-03-11 DIAGNOSIS — D6861 Antiphospholipid syndrome: Secondary | ICD-10-CM

## 2024-03-11 DIAGNOSIS — Z992 Dependence on renal dialysis: Secondary | ICD-10-CM | POA: Diagnosis not present

## 2024-03-11 DIAGNOSIS — R5381 Other malaise: Secondary | ICD-10-CM

## 2024-03-11 DIAGNOSIS — D519 Vitamin B12 deficiency anemia, unspecified: Secondary | ICD-10-CM

## 2024-03-11 DIAGNOSIS — R600 Localized edema: Secondary | ICD-10-CM | POA: Diagnosis not present

## 2024-03-11 DIAGNOSIS — G40909 Epilepsy, unspecified, not intractable, without status epilepticus: Secondary | ICD-10-CM

## 2024-03-11 DIAGNOSIS — N186 End stage renal disease: Secondary | ICD-10-CM | POA: Diagnosis not present

## 2024-03-11 DIAGNOSIS — I10 Essential (primary) hypertension: Secondary | ICD-10-CM

## 2024-03-11 LAB — RENAL FUNCTION PANEL
Albumin: 2.7 g/dL — ABNORMAL LOW (ref 3.5–5.0)
Anion gap: 17 — ABNORMAL HIGH (ref 5–15)
BUN: 53 mg/dL — ABNORMAL HIGH (ref 6–20)
CO2: 23 mmol/L (ref 22–32)
Calcium: 9.5 mg/dL (ref 8.9–10.3)
Chloride: 95 mmol/L — ABNORMAL LOW (ref 98–111)
Creatinine, Ser: 13.64 mg/dL — ABNORMAL HIGH (ref 0.61–1.24)
GFR, Estimated: 4 mL/min — ABNORMAL LOW (ref >60)
Glucose, Bld: 73 mg/dL (ref 70–99)
Phosphorus: 8.1 mg/dL — ABNORMAL HIGH (ref 2.5–4.6)
Potassium: 4.8 mmol/L (ref 3.5–5.1)
Sodium: 135 mmol/L (ref 135–145)

## 2024-03-11 LAB — CBC
HCT: 31.4 % — ABNORMAL LOW (ref 39.0–52.0)
Hemoglobin: 10.3 g/dL — ABNORMAL LOW (ref 13.0–17.0)
MCH: 34 pg (ref 26.0–34.0)
MCHC: 32.8 g/dL (ref 30.0–36.0)
MCV: 103.6 fL — ABNORMAL HIGH (ref 80.0–100.0)
Platelets: 187 K/uL (ref 150–400)
RBC: 3.03 MIL/uL — ABNORMAL LOW (ref 4.22–5.81)
RDW: 13.8 % (ref 11.5–15.5)
WBC: 6.3 K/uL (ref 4.0–10.5)
nRBC: 0 % (ref 0.0–0.2)

## 2024-03-11 LAB — LACTIC ACID, PLASMA: Lactic Acid, Venous: 1.3 mmol/L (ref 0.5–1.9)

## 2024-03-11 LAB — MAGNESIUM: Magnesium: 2.7 mg/dL — ABNORMAL HIGH (ref 1.7–2.4)

## 2024-03-11 MED ORDER — SEVELAMER CARBONATE 800 MG PO TABS
2400.0000 mg | ORAL_TABLET | Freq: Three times a day (TID) | ORAL | Status: DC
  Administered 2024-03-12 – 2024-03-13 (×4): 2400 mg via ORAL
  Filled 2024-03-11 (×5): qty 3

## 2024-03-11 MED ORDER — HEPARIN SODIUM (PORCINE) 1000 UNIT/ML IJ SOLN
INTRAMUSCULAR | Status: AC
  Filled 2024-03-11: qty 4

## 2024-03-11 MED ORDER — HEPARIN SODIUM (PORCINE) 1000 UNIT/ML DIALYSIS: 40.0000 [IU]/kg | INTRAMUSCULAR | Status: DC | PRN

## 2024-03-11 NOTE — Plan of Care (Signed)

## 2024-03-11 NOTE — Progress Notes (Signed)
 Physical Therapy Treatment Patient Details Name: [name] [name] MRN: [id number] DOB: [birth year] Today's Date: 03/11/2024   History of Present Illness [name] [name] is a 43 y.o. male who presented from [facility] with chest pain. He was recently treated for COVID-19. PMH includes ESRD on hemodialysis, AV fistula placement (Right, 12/24/2023), HTN, Lupus, Seizures (May 2025), Stroke (May 2025), and Wears glasses.    PT Comments  Patient progressing with ambulation distance though noting L foot drag and knee buckle in stance with high fall risk and poor deficit awareness.  Stood at sink for perineal hygiene with min A for balance and cues for thoroughness.  Patient remains appropriate for post-acute inpatient rehab.   If plan is discharge home, recommend the following: A lot of help with walking and/or transfers;A little help with bathing/dressing/bathroom;Help with stairs or ramp for entrance;Assist for transportation;Assistance with cooking/housework   Can travel by private vehicle        Equipment Recommendations  None recommended by PT    Recommendations for Other Services       Precautions / Restrictions Precautions Precautions: Fall Recall of Precautions/Restrictions: Intact Precaution/Restrictions Comments: watch O2     Mobility  Bed Mobility Overal bed mobility: Needs Assistance Bed Mobility: Supine to Sit, Sit to Supine     Supine to sit: Contact guard, Used rails Sit to supine: Min assist   General bed mobility comments: cues for direction to sit up and increased time, using momentum to lift trunk; to supine assist for repositioning, cues for L foot placement    Transfers Overall transfer level: Needs assistance Equipment used: Rolling walker (2 wheels) Transfers: Sit to/from Stand Sit to Stand: From elevated surface, Min assist           General transfer comment: cues for hand placement on bed, assist for balance     Ambulation/Gait Ambulation/Gait assistance: Contact guard assist, Min assist Gait Distance (Feet): 100 Feet Assistive device: Rolling walker (2 wheels) Gait Pattern/deviations: Step-to pattern, Step-through pattern, Decreased stride length, Knee flexed in stance - left, Shuffle       General Gait Details: flexed posture, assist for balance and cues for L knee/hip extension in stance and for foot clearance, SpO2 after ambulation on RA 90%, HR 100   Stairs             Wheelchair Mobility     Tilt Bed    Modified Rankin (Stroke Patients Only)       Balance Overall balance assessment: Needs assistance Sitting-balance support: Feet supported Sitting balance-Leahy Scale: Fair     Standing balance support: Single extremity supported, During functional activity Standing balance-Leahy Scale: Poor Standing balance comment: min a for balance while standing at sink for perineal hygiene                            Communication Communication Communication: No apparent difficulties  Cognition Arousal: Alert Behavior During Therapy: Flat affect   PT - Cognitive impairments: Awareness, Attention, Initiation, Sequencing, Problem solving                       PT - Cognition Comments: slow processing, cues for sequencing sitting up on side of the bed, sustained attention to task, decreased initiation Following commands: Impaired Following commands impaired: Only follows one step commands consistently, Follows one step commands with increased time    Cueing Cueing Techniques: Verbal cues, Visual cues, Tactile cues  Exercises  General Comments General comments (skin integrity, edema, etc.): pt encouraged to perform perineal hygiene standing at sink with min a For balance due to noted soiled bed pad.      Pertinent Vitals/Pain Pain Assessment Pain Assessment: No/denies pain    Home Living                          Prior Function             PT Goals (current goals can now be found in the care plan section) Progress towards PT goals: Progressing toward goals    Frequency    Min 3X/week      PT Plan      Co-evaluation              AM-PAC PT 6 Clicks Mobility   Outcome Measure  Help needed turning from your back to your side while in a flat bed without using bedrails?: A Little Help needed moving from lying on your back to sitting on the side of a flat bed without using bedrails?: A Little Help needed moving to and from a bed to a chair (including a wheelchair)?: A Little Help needed standing up from a chair using your arms (e.g., wheelchair or bedside chair)?: A Little Help needed to walk in hospital room?: A Little Help needed climbing 3-5 steps with a railing? : Total 6 Click Score: 16    End of Session Equipment Utilized During Treatment: Gait belt Activity Tolerance: Patient limited by fatigue Patient left: in bed;with call bell/phone within reach;with bed alarm set   PT Visit Diagnosis: Other abnormalities of gait and mobility (R26.89);Muscle weakness (generalized) (M62.81)     Time: 8494-8467 PT Time Calculation (min) (ACUTE ONLY): 27 min  Charges:    $Gait Training: 8-22 mins $Therapeutic Activity: 8-22 mins PT General Charges $$ ACUTE PT VISIT: 1 Visit                     [name] [name], PT Acute Rehabilitation Services Office:[phone] 03/11/2024    [name] [name] 03/11/2024, 5:10 PM

## 2024-03-11 NOTE — Progress Notes (Addendum)
 PHARMACY CONSULT NOTE FOR:  OUTPATIENT  PARENTERAL ANTIBIOTIC THERAPY (OPAT)  Please note, this OPAT is informational only as patient will be receiving antibiotic therapy with hemodialysis. Plan communicated to renal team.   Indication: Infected hematoma associated with AVF/cellulitis  Regimen: Vancomycin  1,000 mg IV QMWF HD  End date: 03/17/24- last dose of Vancomycin  to be administered with HD session 9/29   IV antibiotic discharge orders are pended. To discharging provider:  please sign these orders via discharge navigator,  Select New Orders & click on the button choice - Manage This Unsigned Work.     Thank you for allowing pharmacy to be a part of this patient's care.  Massie Fila, PharmD Clinical Pharmacist  03/11/2024 12:01 PM

## 2024-03-11 NOTE — Discharge Instructions (Addendum)
 [name] [name],  You were recently admitted to [facility] for chest pain that came from your cough, as well as needing dialysis and for the swelling in your right upper arm. We made sure you did not have a clot in your fistula, which you do not. You do have an infection called cellulitis, which we treated with IV antibiotics. We would like you to continue get these antibiotics, called Vancomycin , when you go to your dialysis center for the next 7 days after you leave today.   Continue taking your home medications with the following changes:  Stop taking: Cephalexin  (Keflex ) 500 mg capsule.  This is the antibiotic you were previously taking before you were hospitalized. Benadryl  25 mg. Robitussin DM for cough. Molunpiravir for COVID.  Continue taking: Tylenol  as needed for pain Eliquis  5 mg tablet, 2 times a day Aspirin  81 mg tablet daily Lipitor 40 mg tablet daily Auryxia  at 1 g tablet every Monday Wednesday Friday after dialysis Flexeril  10 mg tablet twice daily as needed for muscle spasms Depakote  500 mg tablet every 12 hours for seizures Keppra  500 mg tablet, 2 times daily for seizures Losartan  50 mg tablet daily Metoprolol  succinate 25 mg tablet daily Multivitamin Nitroglycerin  0.4 mg sublingual tablet as needed for chest pain  You should seek further medical care if you notice the swelling in your arm get worse, notice any drainage or pus from your fistula, or start developing any fever  > 101.  We recommend that you see your primary care doctor in about a week to make sure that you continue to improve and to make sure you complete 7 days of Vancomycin  at dialysis.   We are so glad that you are feeling better.  Sincerely, Mikesha Migliaccio, DO

## 2024-03-11 NOTE — Discharge Summary (Deleted)
 Name: [name] [name] MRN: [id number] DOB: 1980-08-08 43 y.o. PCP: Theotis Haze ORN, NP  Date of Admission: 03/07/2024 11:09 PM Date of Discharge: 03/11/2024  Attending Physician: Dr. Reyes Fenton  Discharge Diagnosis: Principal Problem:   Cellulitis of right upper extremity Active Problems:   Essential hypertension   ESRD (end stage renal disease) on dialysis [facility])   Seizure, late effect of stroke (HCC)   Macrocytic anemia   ESRD on hemodialysis (HCC)   Chest pain   Chronic hypoxic respiratory failure, on home oxygen therapy (HCC)   Right arm pain   Hypercoagulable state   AVF (arteriovenous fistula)  Hypoalbuminuria Hyperphosphatemia COVID-19  Discharge Medications: Allergies as of 03/11/2024       Reactions   Cetirizine & Related Swelling   Hibiclens  [chlorhexidine  Gluconate] Itching        Medication List     STOP taking these medications    cephALEXin  500 MG capsule Commonly known as: KEFLEX    diphenhydrAMINE  25 mg capsule Commonly known as: BENADRYL    guaiFENesin -dextromethorphan 100-10 MG/5ML syrup Commonly known as: ROBITUSSIN DM   molnupiravir EUA 200 MG Caps capsule Commonly known as: LAGEVRIO       TAKE these medications    acetaminophen  325 MG tablet Commonly known as: TYLENOL  Take 2 tablets (650 mg total) by mouth every 6 (six) hours as needed for mild pain.   apixaban  5 MG Tabs tablet Commonly known as: Eliquis  Take 1 tablet (5 mg total) by mouth 2 (two) times daily.   aspirin  81 MG chewable tablet Chew 1 tablet (81 mg total) by mouth daily.   atorvastatin  40 MG tablet Commonly known as: LIPITOR Take 1 tablet (40 mg total) by mouth daily.   Auryxia  1 GM 210 MG(Fe) tablet Generic drug: ferric citrate  Take 840 mg by mouth See admin instructions. Take 840 mg by mouth every evening on Monday, Wednesday, and Friday. Take 840 mg by mouth three times daily on Tuesday, Thursday, Saturday, and Sunday.   cyclobenzaprine  10 MG  tablet Commonly known as: FLEXERIL  Take 1 tablet (10 mg total) by mouth 2 (two) times daily as needed for muscle spasms.   divalproex  500 MG DR tablet Commonly known as: DEPAKOTE  Take 2 tablets (1,000 mg total) by mouth every 12 (twelve) hours.   imiquimod  5 % cream Commonly known as: Aldara  Apply to affected area three times weekly for 8 weeks. What changed:  how much to take how to take this when to take this additional instructions   levETIRAcetam  500 MG tablet Commonly known as: Keppra  Take 1 tablet (500 mg total) by mouth 2 (two) times daily.   losartan  50 MG tablet Commonly known as: COZAAR  Take 50 mg by mouth daily.   metoprolol  succinate 25 MG 24 hr tablet Commonly known as: TOPROL -XL Take 1 tablet (25 mg total) by mouth daily.   multivitamin with minerals tablet Take 1 tablet by mouth daily.   nitroGLYCERIN  0.4 MG SL tablet Commonly known as: NITROSTAT  Place 0.4 mg under the tongue every 5 (five) minutes as needed for chest pain.               Durable Medical Equipment  (From admission, onward)           Start     Ordered   03/11/24 0000  DME Bedside commode       Question:  Patient needs a bedside commode to treat with the following condition  Answer:  Physical deconditioning   03/11/24 1440  03/11/24 0000  For home use only DME 4 wheeled rolling walker with seat       Question:  Patient needs a walker to treat with the following condition  Answer:  Mobility impaired   03/11/24 1440            Disposition and follow-up:   Mr.[name] [name] was discharged from [facility] in Stable condition.  At the hospital follow up visit please address:  1.  Follow-up:  a. Patient receiving IV Vancomycin  with his dialysis sessions until Monday, 9/28. This is to help clear his cellulitis on his RUE.    b. His RUE swelling and erythema. It was improving with elevation, compression, and antibiotics. Please continue to ensure the  patient is elevating and compressing his RUE until resolution of his infection.   2.  Labs / imaging needed at time of follow-up: CBC, CMP  Follow-up Appointments: to be made within 1 week of discharge.    Hospital Course by problem list: [name] [name] is a 43 y.o. person living with a history of ESRD on HD, antiphospholipid syndrome, seizure disorder, SLE, prior CVA's, and obesity who presented with chest pain and admitted for chest pain work up.  #Chest Pain, resolved #COVID-19 The patient presented with left 1 day history of chest pain.  He reported that it was centrally located without radiation and described as both sharp and stabbing.  He had resolved by the time he was evaluated by the admitting team in the ER. Troponin elevated however at baseline and flat. EKG nonconcerning for acute infarct or ischemic event. Patient did test positive for COVID last week and has been coughing since. Suspected this was contributing to his chest pain. Over the course of his hospitalization, he remained chest pain free and had no reproducible chest pain to palpation. Nephrology suspected overall chest pain was due to volume overload and pulmonary edema, as the patient had missed 2 sessions of dialysis last week. Remained without respiratory distress, pt was on 3L [place] on initial evaluation, weaned as tolerated to 93% on RA.   #Physical Deconditioning PT and OT consulted to assess if patient required continued assistance at long term care facility, or if he was able to discharge to home. Both recommend CIR before discharging home. Rehab admission coordinator consulted, however the patient is not a candidate for CIR level rehab. OT recommending rollator walker and bedside commode if patient is to discharge home. PT/OT following. Discussed with patient today the importance of rehabilitating at home to decrease chances of readmission.   #RUE Edema #Suspected Cellulitis  #AV Fistula Patient presented to the ED  initially on 9/14 with right upper extremity swelling that was considered to be secondary to his recent vascular procedure on 9/9.  Ace wrap and outpatient follow-up was recommended at this time. On 9/19 he presented yet again to the ER with worsening symptoms. At this time, suspected to be cellulitis secondary to his recent procedure. He was discharged on a 7-day course of Keflex . During this admission, his proximal right upper extremity was found to have pitting edema to the fingertips, induration, erythema, and warmth to the touch. Vascular surgery was consulted and followed. US  of AVF on 9/19 showed a complex structure measuring 6.9 x 2.6 x 6.5 in the medial proximal upper arm, likely indicative of a hematoma. Venous Duplex without signs of DVT. Suspected edema may be secondary to cellulitis vs venous occlusive event. Patient remained afebrile and without leukocytosis, so low  concern for systemic infection. He was placed on  IV Vancomycin  per pharmacy, where he completed 3 doses while inpatient. He will need to complete another 7 days with HD after discharge. Vascular surgery recommended continued elevation and compression with close monitoring for resolution, as well as a CT Venogram of the RUE. CT Venogram showed no signs of central venous occlusive events and the fistula is widely patent. Patient will follow up with vascular in the outpatient setting if he needs fistulogram.    #ESRD on HD #Electrolyte derangements Patient with longstanding history of ESRD and has been on dialysis for the past 6 years. He did present to the ER on 9/19 after missed dialysis session on Wednesday and was able to undergo dialysis for approximately an hour. He reported difficulty sitting through dialysis due to needing to wear a mask with recent COVID diagnosis. On admission creatinine 22.63, BUN 99, potassium 5.2, CO2 21, anion gap 23. Nephrology consulted, patient had HD on admission night due to pulm edema shown on CXR.   Patient had repeat HD the following day, then was resumed on his normal outpatient HD schedule. Monitored renal function panel while inpatient and [place] was started on 9/24.   #Antiphospholipid syndrome #History of multiple CVAs The patient does have a longstanding history of phospholipid syndrome for which she has followed with hematology in the past however is not currently. He did have multiple admissions over the past year for CVAs. Patient is currently on Eliquis  5 mg twice daily however has been recurrent historically nonadherent. With antiphospholipid syndrome, warfarin is the preferred agent.  Upon chart review it seems as if this patient has been initiated on warfarin multiple times without adherence in the past. During this hospitalization, we continued Eliquis  5 mg and encouraged patient to follow-up with hematology outpatient   #Anemia of chronic disease #Macrocytic anemia The patient does have a long stay history of anemia with baseline hemoglobin 9-10. Patient at baseline on admission with hemoglobin of 9.6. Likely secondary to ESRD. MCV elevation was 107.4.  After chart review it seems like the patient was normocytic until July of this year. Vitamin B12 elevated at 948, folate normal at 14.1. Suspect reduced renal clearance of vitamin B12 given ESRD. Continued monitoring CBC.   #Seizure Disorder Patient does have a well-documented history of recurrent seizures due to nonadherence to outpatient medication. He does live at a long-term care facility and has been about 6 months.  Suspect this is contributing to increased medical adherence. Most recent seizure noted 10/25/2023. Continued Keppra  500 mg twice daily.  #Hypertension Patient mildly hypertensive on presented to the ER at 145/96. Continued home medications of losartan  50 mg daily and Toprol  XL 25 mg daily.   Discharge Subjective: Patient reports feeling well today.  He states that the swelling in his arm has reduced dramatically,  and they had recently wrapped his arm to help compress and elevate.  He understands that he is not a current candidate for CIR, and will be receiving a rollator walker and bedside commode at discharge to help with his mobility and quality living.  He is ready for discharge.  Discharge Exam:   BP 106/72 (BP Location: Left Arm)   Pulse 63   Temp 97.7 F (36.5 C) (Oral)   Resp 17   Ht 6' (1.829 m)   Wt 112.2 kg   SpO2 93%   BMI 33.55 kg/m  Constitutional: tired-appearing male sleeping in bed, in no acute distress HENT: normocephalic atraumatic, mucous membranes  moist Eyes: conjunctiva non-erythematous Neck: supple Cardiovascular: regular rate and rhythm, no m/r/g Pulmonary/Chest: normal work of breathing on room air, lungs clear to auscultation bilaterally Abdominal: soft, non-tender, non-distended MSK: right upper extremity wrapped in ace bandaging, swelling decreased from previous days. Reduced erythma and induration from admission. Neurological: alert & oriented x 3, 5/5 strength in bilateral upper and lower extremities, normal gait Skin: warm and dry Psych: normal mood and affect   Pertinent Labs, Studies, and Procedures:     Latest Ref Rng & Units 03/11/2024    4:55 AM 03/10/2024   10:14 AM 03/08/2024   12:17 AM  CBC  WBC 4.0 - 10.5 K/uL 6.3  8.3  8.1   Hemoglobin 13.0 - 17.0 g/dL 89.6  89.3  9.6   Hematocrit 39.0 - 52.0 % 31.4  33.1  30.3   Platelets 150 - 400 K/uL 187  236  166        Latest Ref Rng & Units 03/11/2024    4:55 AM 03/10/2024   10:14 AM 03/09/2024    4:45 AM  CMP  Glucose 70 - 99 mg/dL 73  885  68   BUN 6 - 20 mg/dL 53  36  54   Creatinine 0.61 - 1.24 mg/dL 86.35  89.25  85.19   Sodium 135 - 145 mmol/L 135  131  135   Potassium 3.5 - 5.1 mmol/L 4.8  4.0  4.4   Chloride 98 - 111 mmol/L 95  94  93   CO2 22 - 32 mmol/L 23  22  25    Calcium  8.9 - 10.3 mg/dL 9.5  9.2  9.1     DG Chest 2 View Result Date: 03/08/2024 CLINICAL DATA:  Chest pain EXAM: CHEST -  2 VIEW COMPARISON:  03/06/2024 FINDINGS: Cardiomegaly, vascular congestion. No overt edema. Bibasilar atelectasis with low lung volumes. No effusions or acute bony abnormality. IMPRESSION: Cardiomegaly, vascular congestion. Low lung volumes with bibasilar atelectasis. Electronically Signed   By: Franky Crease M.D.   On: 03/08/2024 00:05     Discharge Instructions:   Discharge Instructions      CONSTANTINE RUDDICK,  You were recently admitted to [facility] for chest pain that came from your cough, as well as needing dialysis and for the swelling in your right upper arm. We made sure you did not have a clot in your fistula, which you do not. You do have an infection called cellulitis, which we treated with IV antibiotics. We would like you to continue get these antibiotics, called Vancomycin , when you go to your dialysis center for the next 7 days after you leave today.   Continue taking your home medications with the following changes:  Stop taking: Cephalexin  (Keflex ) 500 mg capsule.  This is the antibiotic you were previously taking before you were hospitalized. Benadryl  25 mg. Robitussin DM for cough. Molunpiravir for COVID.  Continue taking: Tylenol  as needed for pain Eliquis  5 mg tablet, 2 times a day Aspirin  81 mg tablet daily Lipitor 40 mg tablet daily Auryxia  at 1 g tablet every Monday Wednesday Friday after dialysis Flexeril  10 mg tablet twice daily as needed for muscle spasms Depakote  500 mg tablet every 12 hours for seizures Keppra  500 mg tablet, 2 times daily for seizures Losartan  50 mg tablet daily Metoprolol  succinate 25 mg tablet daily Multivitamin Nitroglycerin  0.4 mg sublingual tablet as needed for chest pain  You should seek further medical care if you notice the swelling in your arm get  worse, notice any drainage or pus from your fistula, or start developing any fever  > 101.  We recommend that you see your primary care doctor in about a week to make sure that  you continue to improve and to make sure you complete 7 days of Vancomycin  at dialysis.   We are so glad that you are feeling better.  Sincerely, Malessa Zartman, DO    Signed: Gwendalyn Mcgonagle, DO Internal Medicine Resident, PGY-1 03/11/2024, 3:54 PM   Please contact the on call pager after 5 pm and on weekends at [phone].

## 2024-03-11 NOTE — Progress Notes (Signed)
 Med details completed.  RN updated to review and update as needed

## 2024-03-11 NOTE — Discharge Planning (Signed)
 [facility] Patient Discharge Orders- [name] CLINIC: [facility]  Patient's name: [name] [name] Admit/DC Dates: 03/07/2024 - 03/11/2024  Discharge Diagnoses: COVID-19 infection   RUE swelling/cellulitis. Completing course of IV Vancomycin    Outpatient Dialysis Orders:  -Heparin : No change  -EDW: Much lower weights here. Post wt 9/23 was 112 kg   -[place]: No change   Anemia Aranesp : Given: --   Date of last dose/amount: --   PRBC's Given: -- Date/# of units: -- ESA dose for discharge: per protocol   Recent Labs  Lab 03/11/24 0455  HGB 10.3*  K 4.8  CALCIUM  9.5  PHOS 8.1*  ALBUMIN  2.7*   Access intervention/Change: None but planning for outpatient fistulogram    Medications: -IV Antibiotics: Vancomycin  1g IV q HD through 9/29 for cellulitis    OTHER/APPTS/LABS    Completed by: Maisie Ronnald Acosta PA-C   D/C Meds to be reconciled by nurse after every discharge.    Reviewed by: MD:______ RN_______

## 2024-03-11 NOTE — Progress Notes (Signed)
 2205: Received message from nurse that RN rechecked vitals on patient and his BP decreased to 88/56 (MAP: 65). With his SpO2 decreasing to 88-89% on room air. RN provided 2.5L of O2 to increase SPO2 to 95%+. Arrived to patient at bedside who was resting comfortably in bed. Turned oxygen off throughout interview and watched SpO2 which remained >95% with good pleth. Patient stated that he was feeling okay with no complaints. He denied fever, chills, headache, dizziness, cough, or pain otherwise. He returned from HD around 2100, and notes that it is not unusual for him to experience low BP during and after HD. He reports that he doesn't feel any different from usual after his HD sessions. BP rechecked x4 throughout interview with MAP > 65 each time. Physical exam with regular rate and rhythm, bilateral radial pulses 2+, no edema of bilateral LE, normal work of breathing on room air, lungs clear to auscultation bilaterally, right arm in ACE wrap. No signs of infection, afebrile, RR of 16, on appropriate treatment for his cellulitis and resting comfortably in bed. Suspect low BP in the setting of recent HD sessions, notably which were back-to-back days, which yielded 2L today and 3L yesterday. Will obtain a lactate in an abundance of caution. Lungs were clear and patient not experiencing any cough, so do not feel CXR is necessary at this time.  Plan:  - f/u lactic acid  - continue to monitor BP, RN to notify MD if MAP < 65 on any rechecks  - encourage oral rehydration    Doyal Miyamoto, MD [facility] Internal Medicine  PGY-1

## 2024-03-11 NOTE — TOC Progression Note (Signed)
 Transition of Care [facility]) - Progression Note    Patient Details  Name: [name] [name] MRN: [id number] Date of Birth: 23-Jun-1980  [facility] CM/SW Contact  Sherline Clack, [place] Phone Number: 03/11/2024, 5:33 PM  Clinical Narrative:     Insurance authorization pending for [facility], [place] ID: [id number].                     Expected Discharge Plan and Services         Expected Discharge Date: 03/11/24                                     Social Drivers of Health (SDOH) Interventions SDOH Screenings   Food Insecurity: No Food Insecurity (03/08/2024)  Housing: Unknown (03/08/2024)  Transportation Needs: No Transportation Needs (03/08/2024)  Utilities: Not At Risk (03/08/2024)  Alcohol Screen: Low Risk  (03/26/2023)  Depression (PHQ2-9): Low Risk  (03/26/2023)  Financial Resource Strain: Low Risk  (03/26/2023)  Physical Activity: Inactive (03/26/2023)  Social Connections: Patient Declined (01/06/2024)  Recent Concern: Social Connections - Moderately Isolated (10/21/2023)  Stress: No Stress Concern Present (03/26/2023)  Tobacco Use: Medium Risk (03/07/2024)  Health Literacy: Adequate Health Literacy (03/26/2023)    Readmission Risk Interventions    01/08/2024   12:19 PM 03/28/2023    2:57 PM 01/25/2023    3:40 PM  Readmission Risk Prevention Plan  Post Dischage Appt     Medication Screening     Transportation Screening Complete Complete   Medication Review (RN Care Manager) Complete Referral to Pharmacy   PCP or Specialist appointment within 3-5 days of discharge Complete Complete   HRI or Home Care Consult Complete Complete   SW Recovery Care/Counseling Consult Complete Complete   Palliative Care Screening Not Applicable Not Applicable   Skilled Nursing Facility Complete Not Applicable      Information is confidential and restricted. Go to Review Flowsheets to unlock data.

## 2024-03-11 NOTE — Progress Notes (Signed)
 HD#2 SUBJECTIVE:  Patient Summary: [name] [name] is a 43 y.o. with a pertinent PMH of ESRD on HD, antiphospholipid antibody syndrome, and previous CVA who presented with chest pain and admitted for hemodialysis and worsening RUE edema, which is improving.   Overnight Events: none  Interim History: Today, the patient reports he is feeling better than he had when he was admitted. His cough has improved and is not coughing up much phlegm. He received HD at bedside today and denies any SOB, fevers, or distress. Discussed PT/OT evaluation, and the patient understands he is not eligible for CIR during this admission.   OBJECTIVE:  Vital Signs: Vitals:   03/11/24 0400 03/11/24 0500 03/11/24 0808 03/11/24 1213  BP: 135/70  (!) 141/79 106/72  Pulse: 72  72 63  Resp: 17  16 17   Temp: 98 F (36.7 C)  (!) 97.4 F (36.3 C) 97.7 F (36.5 C)  TempSrc: Oral  Oral Oral  SpO2: 90%  93% 93%  Weight:  112.2 kg    Height:       Supplemental O2: Nasal Cannula SpO2: 93 % O2 Flow Rate (L/min): 2 L/min  Filed Weights   03/10/24 0150 03/10/24 0804 03/11/24 0500  Weight: 116.1 kg 113.1 kg 112.2 kg    No intake or output data in the 24 hours ending 03/11/24 1643  Net IO Since Admission: -6,319.79 mL [03/11/24 1643]  Physical Exam: Physical Exam Constitutional:      Appearance: He is obese.  Eyes:     Pupils: Pupils are equal, round, and reactive to light.  Cardiovascular:     Rate and Rhythm: Normal rate and regular rhythm.     Pulses:          Radial pulses are 2+ on the right side and 2+ on the left side.     Heart sounds: Normal heart sounds.  Pulmonary:     Effort: Pulmonary effort is normal. No respiratory distress.     Breath sounds: Normal breath sounds.  Chest:     Chest wall: No tenderness.  Abdominal:     General: Bowel sounds are normal.     Palpations: Abdomen is soft.     Tenderness: There is no abdominal tenderness.  Musculoskeletal:     Cervical back: Normal range of  motion.     Comments: RUE less erythematous, edematous, indurated, or warm to the touch than prior. Still without purulence or drainage. Ace bandages from fingers to elbow placed. Palpable upper arm thrill from AV fistula present.   Skin:    General: Skin is warm and dry.  Neurological:     General: No focal deficit present.     Mental Status: He is alert.  Psychiatric:        Mood and Affect: Mood normal.     Patient Lines/Drains/Airways Status     Active Line/Drains/Airways     Name Placement date Placement time Site Days   Fistula / Graft Right Upper arm Arteriovenous fistula 12/24/23  0808  Upper arm  76   Hemodialysis Catheter Right 01/09/24  0927  --  60   Wound 12/24/23 0810 Surgical Closed Surgical Incision Arm Right 12/24/23  0810  Arm  76            Pertinent labs and imaging:      Latest Ref Rng & Units 03/11/2024    4:55 AM 03/10/2024   10:14 AM 03/08/2024   12:17 AM  CBC  WBC 4.0 - 10.5  K/uL 6.3  8.3  8.1   Hemoglobin 13.0 - 17.0 g/dL 89.6  89.3  9.6   Hematocrit 39.0 - 52.0 % 31.4  33.1  30.3   Platelets 150 - 400 K/uL 187  236  166        Latest Ref Rng & Units 03/11/2024    4:55 AM 03/10/2024   10:14 AM 03/09/2024    4:45 AM  CMP  Glucose 70 - 99 mg/dL 73  885  68   BUN 6 - 20 mg/dL 53  36  54   Creatinine 0.61 - 1.24 mg/dL 86.35  89.25  85.19   Sodium 135 - 145 mmol/L 135  131  135   Potassium 3.5 - 5.1 mmol/L 4.8  4.0  4.4   Chloride 98 - 111 mmol/L 95  94  93   CO2 22 - 32 mmol/L 23  22  25    Calcium  8.9 - 10.3 mg/dL 9.5  9.2  9.1     No results found.  ASSESSMENT/PLAN:  Assessment: Principal Problem:   Cellulitis of right upper extremity Active Problems:   Essential hypertension   ESRD (end stage renal disease) on dialysis (HCC)   Seizure, late effect of stroke (HCC)   Macrocytic anemia   ESRD on hemodialysis (HCC)   Chest pain   Chronic hypoxic respiratory failure, on home oxygen therapy (HCC)   Right arm pain   Hypercoagulable  state   AVF (arteriovenous fistula)  [name] [name] is a 43 y.o. with a pertinent PMH of ESRD on HD, antiphospholipid antibody syndrome, and previous CVA who presented with chest pain and admitted for hemodialysis and worsening RUE edema.   Plan: #RUE Edema #Suspected Cellulitis vs Venous Occlusive Event Ongoing issue since AVF revision in early September. Vascular surgery consulted and following. Continuing IV Vancomycin  (day 3) for concerns of cellulitis. VVS encouraging compression. CT Venogram shows no central venous occlusion events and a patent fistula. Patient RUE compressed in ace bandage today, showing marked improvement. Will continue to compress and elevate. Patient will follow with vascular surgery for fistulogram in a few weeks in the outpatient setting.  - Continue elevation, compression of RUE, monitor closely - Pending CT  - IV Vancomycin  per pharmacy (day 3) - VVS following  #Physical Deconditioning PT and OT consulted yesterday to assess if patient requires continued assistance at long term care facility, or if he is able to discharge to home. Both recommend CIR before discharging home. Rehab admission coordinator consulted, however patient is not considered a candidate at this time. OT recommending rollator walker and bedside commode if patient is to discharge home. PT and OT will continue to see patient while in acute hospital setting. Discussed with patient today the importance of rehabilitating to improve his chances of needing readmission, and will return to SNF at discharge tomorrow once prior authorization is complete.  - Appreciate PT/OT recommendations - Pending authorization.  #ESRD on HD MWF #Electrolyte Derangements. Chronic. On HD MWF, received dialysis this morning at bedside. Patient to continue outpatient HD schedule. Patient is to be on renal diet with fluid restriction. Phosphorus elevated at 8.1. - Continue HD as scheduled - Start Darbe 100mcg weekly on  9/24 - Renal Function Panel, CBC in AM  #Chest Pain, resolved #COVID-19 Patient reports no chest pain today, no reproducible pain to palpation on exam. Troponins elevated on admission but at baseline and flat. Patient is denies persistent coughing. Maintaining 93% on RA. -- Consider antitussives if coughing worsens --  Wean O2 as tolerated  #Antiphospholipid Syndrome #Hx of CVAs, multiple Chronic. Previously followed with hematology. Currently on Eliquis  5mg  BID, but previously nonadherent. Better adherence now, but suboptimal treatment on Eliquis . Has been started on Warfarin multiple times previously, but due to nonadherence was switched to Eliquis .  Patient wants to be discharged to home and not return to SNF. PT OT recommending CIR before discharge, will keep on Eliquis  for now.  - Continue Eliquis  5mg   # Macrocytic Anemia  Secondary to chronic ESRD. Hgb today pending. Nephrology recommends starting Darbe on 9/24 at 100mcg weekly sq to chronically low hemoglobin and hematocrit. Vitamin B12 elevated at 948, folate normal at 14.1. Suspect reduced renal clearance of vitamin B12 given ESRD. - Monitor CBC  #Seizure Disorder Last seizure 10/25/2023. Previously nonadherent to medication, but since living in SNF has better adherence.  - Continue home Keppra  500mg   #Hypertension BP today 130/62. Improving from admission. - Continue losartan  50mg  daily and Toprol  XL 25mg  daily.  Best Practice: Diet: Renal diet, with fluid restriction VTE: Heparin , DOAC Code: Full  Disposition planning: Therapy Recs: SNF, living long term DISPO: Anticipated discharge tomorrow to Skilled nursing facility pending prior authorization.  Signature:  Galit Urich, DO Internal Medicine Resident, PGY-1 Please contact the on call pager at [phone] for any urgent or emergent needs. 4:43 PM 03/11/2024

## 2024-03-11 NOTE — Progress Notes (Signed)
 Patient ID: [name] [name], male   DOB: 01/05/1981, 43 y.o.   MRN: [id number] [facility] Progress Note   Assessment/ Plan:   1.  Chest pain: Suspect that this was largely related to volume overload/pulmonary edema with negative markers for ACS.  Resolved/improved with ultrafiltration on hemodialysis.  He is on respiratory isolation for COVID-19 infection that is being managed supportively. 2. ESRD: Usually on MWF dialysis schedule and had dialysis done yesterday instead of Monday because of high patient census/scheduling difficulties.  Will order for dialysis again today to maintain his outpatient schedule. 3. Anemia: Low hemoglobin/hematocrit, slight uptrend noted in hospital and will defer ESA 4. CKD-MBD: Calcium  level currently at goal, will order phosphorus level to be done with today's labs. 5. Nutrition: Continue renal diet with fluid restriction 6. Hypertension: Blood pressure marginally elevated, monitor with hemodialysis/UF 7.  Right arm swelling: Appears to be predominantly due to venous hypertension with ipsilateral fistula and right IJ TDC.  CT venogram without acute lesion and he is empirically on vancomycin  for coverage of cellulitis.  Plans noted by vascular surgery to undertake fistulogram after 2 to 4 weeks to assess for possible central venous stenosis.  Subjective:   Denies any chest pain today and does not have any problems other than intermittent coughing.   Objective:   BP 135/70 (BP Location: Right Leg)   Pulse 72   Temp 98 F (36.7 C) (Oral)   Resp 17   Ht 6' (1.829 m)   Wt 112.2 kg   SpO2 90%   BMI 33.55 kg/m   Physical Exam: Gen: Sleeping soundly in bed and arouses with difficulty. CVS: Pulse regular rhythm, normal rate, S1 and S2 normal Resp: Clear to auscultation bilaterally, no distinct rales or rhonchi Abd: Soft, obese, nontender, bowel sounds normal Ext: Trace bilateral lower extremity edema.  Edematous right upper extremity with palpable  upper arm thrill from AV fistula and wrapped in Ace wrap from elbow to hand  Labs: BMET Recent Labs  Lab 03/06/24 1001 03/08/24 0017 03/08/24 1822 03/09/24 0445 03/10/24 1014 03/11/24 0455  NA 136 136 136 135 131* 135  K 4.8 5.2* 5.3* 4.4 4.0 4.8  CL 94* 92* 93* 93* 94* 95*  CO2 26 21* 23 25 22 23   GLUCOSE 73 90 67* 68* 114* 73  BUN 78* 99* 106* 54* 36* 53*  CREATININE 20.23* 22.63* 24.54* 14.80* 10.74* 13.64*  CALCIUM  8.8* 8.9 8.7* 9.1 9.2 9.5  PHOS  --   --   --   --  5.0* 8.1*   CBC Recent Labs  Lab 03/06/24 1001 03/08/24 0017 03/10/24 1014 03/11/24 0455  WBC 7.6 8.1 8.3 6.3  NEUTROABS 5.1  --   --   --   HGB 9.0* 9.6* 10.6* 10.3*  HCT 28.4* 30.3* 33.1* 31.4*  MCV 108.4* 107.4* 105.4* 103.6*  PLT 145* 166 236 187     Medications:     acetaminophen   1,000 mg Oral Q6H WA   apixaban   5 mg Oral BID   aspirin   81 mg Oral Daily   atorvastatin   40 mg Oral Daily   darbepoetin (ARANESP ) injection - DIALYSIS  100 mcg Subcutaneous Q Wed-1800   divalproex   1,000 mg Oral Q12H   levETIRAcetam   500 mg Oral BID   losartan   50 mg Oral Daily   metoprolol  succinate  25 mg Oral Daily   multivitamin with minerals  1 tablet Oral Daily   Gordy Blanch, MD 03/11/2024, 8:29 AM

## 2024-03-11 NOTE — Progress Notes (Addendum)
 22:49 pm: RN rechecked vitals on patient and his bp decreased to 88/56 (65). With his SPO2 decreasing to 88-89% on room air. This RN provided 2.5L of O2 to increase SPO2 to 95%+. And informed providers on call, Dr. Kandis and Nguyen.   Provider came to bedside.   Per providers, no further interventions on RN side. Inform providers when map decrease below 65.   Manual bp was also taken. 88/60, also informed providers about this.   At this time (23:18pm), patient has no complaints.

## 2024-03-11 NOTE — Procedures (Signed)
 Received patient in bed at bedside Alert and oriented.  Informed consent signed and in chart. RIJ dual lumen catheter accessed per policy, without difficulty. Tx initiated per MD order. 2015 Pt O2 sats in 80s, O2 at 2l per [place] applied with good results. Pt denies C/O. Will continue to monitor   TX duration:3 hours Tx complete. Blood returned. Dual lumen catheter flushed with NS, heparin  left to dwell. Caps and clamps in place. Patient tolerated well.  Alert, without acute distress.  Hand-off given to patient's nurse.   Access used: RIJ tunneled dual lumen catheter Access issues: none  Total UF removed: 2000 ml Medication(s) given: See MAR   [facility]

## 2024-03-11 NOTE — Progress Notes (Signed)
 Contacted by nephrology RN regarding possibility of diverting pt to get out-pt treatment tomorrow at HD clinic. Contacted clinic [facility]) at this time who stated they do not have availability for tomorrow to fit him in. Will continue to assist as needed.   Lavanda Toren Tucholski Dialysis Navigator [phone]

## 2024-03-12 DIAGNOSIS — R5381 Other malaise: Secondary | ICD-10-CM | POA: Diagnosis not present

## 2024-03-12 DIAGNOSIS — R600 Localized edema: Secondary | ICD-10-CM | POA: Diagnosis not present

## 2024-03-12 DIAGNOSIS — Z992 Dependence on renal dialysis: Secondary | ICD-10-CM | POA: Diagnosis not present

## 2024-03-12 DIAGNOSIS — N186 End stage renal disease: Secondary | ICD-10-CM | POA: Diagnosis not present

## 2024-03-12 MED ORDER — TRIMETHOBENZAMIDE HCL 100 MG/ML IM SOLN: 200.0000 mg | Freq: Four times a day (QID) | INTRAMUSCULAR | Status: DC | PRN

## 2024-03-12 NOTE — Progress Notes (Signed)
 Patient ID: [name] [name], male   DOB: [birth year], 43 y.o.   MRN: [id number] [facility] Progress Note   Assessment/ Plan:   1.  Chest pain: Suspect that this was largely related to volume overload/pulmonary edema with negative markers for ACS and has improved with UF/HD.  He is on respiratory isolation for COVID-19 infection that is being managed supportively. 2. ESRD: Usually on MWF dialysis schedule and had dialysis done yesterday as scheduled.  Attempting efforts at volume unloading in the setting of his relative hypotension with clear evidence of volume excess seen on exam.  If he is not orthostatic, may be suitable for discharge home today to resume outpatient dialysis tomorrow if there are no additional barriers. 3. Anemia: Low hemoglobin/hematocrit, slight uptrend noted in hospital and will defer ESA 4. CKD-MBD: Calcium  level currently at goal, will order phosphorus level to be done with today's labs. 5. Nutrition: Continue renal diet with fluid restriction 6. Hypertension: Blood pressure marginally elevated, monitor with hemodialysis/UF 7.  Right arm swelling: Appears to be predominantly due to venous hypertension with ipsilateral fistula and right IJ TDC.  CT venogram without acute lesion and he is empirically on vancomycin  for coverage of cellulitis.  Plans noted by vascular surgery to undertake fistulogram after 2 to 4 weeks to assess for possible central venous stenosis.  Subjective:   Noted to have some post dialytic hypotension and transient hypoxia yesterday.  No acute events otherwise overnight and reports some improvement in right arm swelling.   Objective:   BP 94/62 (BP Location: Left Arm)   Pulse 75   Temp 98.3 F (36.8 C)   Resp 18   Ht 6' (1.829 m)   Wt 109.3 kg   SpO2 94%   BMI 32.68 kg/m   Physical Exam: Gen: Sleeping in bed, arouses with difficulty CVS: Pulse regular rhythm, normal rate, S1 and S2 normal Resp: Clear to auscultation bilaterally,  no distinct rales or rhonchi Abd: Soft, obese, nontender, bowel sounds normal Ext: Trace bilateral lower extremity edema.  Edematous right upper extremity with palpable upper arm thrill from AV fistula and wrapped in Ace wrap from elbow to hand  Labs: BMET Recent Labs  Lab 03/06/24 1001 03/08/24 0017 03/08/24 1822 03/09/24 0445 03/10/24 1014 03/11/24 0455  NA 136 136 136 135 131* 135  K 4.8 5.2* 5.3* 4.4 4.0 4.8  CL 94* 92* 93* 93* 94* 95*  CO2 26 21* 23 25 22 23   GLUCOSE 73 90 67* 68* 114* 73  BUN 78* 99* 106* 54* 36* 53*  CREATININE 20.23* 22.63* 24.54* 14.80* 10.74* 13.64*  CALCIUM  8.8* 8.9 8.7* 9.1 9.2 9.5  PHOS  --   --   --   --  5.0* 8.1*   CBC Recent Labs  Lab 03/06/24 1001 03/08/24 0017 03/10/24 1014 03/11/24 0455  WBC 7.6 8.1 8.3 6.3  NEUTROABS 5.1  --   --   --   HGB 9.0* 9.6* 10.6* 10.3*  HCT 28.4* 30.3* 33.1* 31.4*  MCV 108.4* 107.4* 105.4* 103.6*  PLT 145* 166 236 187     Medications:     acetaminophen   1,000 mg Oral Q6H WA   apixaban   5 mg Oral BID   aspirin   81 mg Oral Daily   atorvastatin   40 mg Oral Daily   darbepoetin (ARANESP ) injection - DIALYSIS  100 mcg Subcutaneous Q Wed-1800   divalproex   1,000 mg Oral Q12H   levETIRAcetam   500 mg Oral BID   multivitamin with  minerals  1 tablet Oral Daily   sevelamer  carbonate  2,400 mg Oral TID WC   Gordy Blanch, MD 03/12/2024, 7:58 AM

## 2024-03-12 NOTE — Progress Notes (Signed)
 Physical Therapy Treatment Patient Details Name: [name] [name] MRN: [id number] DOB: 05/03/1981 Today's Date: 03/12/2024   History of Present Illness [name] [name] is a 43 y.o. male who presented from [facility] with chest pain. He was recently treated for COVID-19. PMH includes ESRD on hemodialysis, AV fistula placement (Right, 12/24/2023), HTN, Lupus, Seizures (May 2025), Stroke (May 2025), and Wears glasses.    PT Comments  Patient agreeable to increase distance with ambulation today compared to last session.  Still with excessive L lateral weight shift and decreased L foot clearance.  Encouraged to perform seated therex, though he fatigued with it/lost attention quickly.  Patient remains appropriate for inpatient rehab at d/c.    If plan is discharge home, recommend the following: A little help with bathing/dressing/bathroom;Help with stairs or ramp for entrance;Assist for transportation;Assistance with cooking/housework;A little help with walking and/or transfers   Can travel by private vehicle     Yes  Equipment Recommendations  None recommended by PT    Recommendations for Other Services       Precautions / Restrictions Precautions Precautions: Fall Recall of Precautions/Restrictions: Intact Precaution/Restrictions Comments: watch O2     Mobility  Bed Mobility               General bed mobility comments: in recliner    Transfers Overall transfer level: Needs assistance Equipment used: Rolling walker (2 wheels) Transfers: Sit to/from Stand Sit to Stand: Contact guard assist           General transfer comment: assist for safety    Ambulation/Gait Ambulation/Gait assistance: Min assist Gait Distance (Feet): 150 Feet Assistive device: Rolling walker (2 wheels) Gait Pattern/deviations: Step-through pattern, Step-to pattern, Decreased dorsiflexion - left, Decreased weight shift to right, Decreased step length - left       General Gait Details:  cues for increased L step length, facilitation for R lateral weight shift   Stairs             Wheelchair Mobility     Tilt Bed    Modified Rankin (Stroke Patients Only)       Balance Overall balance assessment: Needs assistance   Sitting balance-Leahy Scale: Fair     Standing balance support: Bilateral upper extremity supported Standing balance-Leahy Scale: Poor                              Communication Communication Communication: No apparent difficulties  Cognition Arousal: Alert Behavior During Therapy: Flat affect   PT - Cognitive impairments: Awareness, Attention, Initiation, Sequencing, Problem solving                       PT - Cognition Comments: slow processing, repeated cues needed for sequencing for R lateral weight shift for L foot clearance Following commands: Impaired Following commands impaired: Only follows one step commands consistently, Follows one step commands with increased time    Cueing    Exercises Other Exercises Other Exercises: seated trunk extension x 3 for tall posture with 5 sec hold mod cues, pt would not perform more despite cues/encouragement    General Comments General comments (skin integrity, edema, etc.): HR 101 after ambulation      Pertinent Vitals/Pain Pain Assessment Pain Assessment: No/denies pain    Home Living                          Prior  Function            PT Goals (current goals can now be found in the care plan section) Progress towards PT goals: Progressing toward goals    Frequency    Min 2X/week      PT Plan      Co-evaluation              AM-PAC PT 6 Clicks Mobility   Outcome Measure  Help needed turning from your back to your side while in a flat bed without using bedrails?: A Little Help needed moving from lying on your back to sitting on the side of a flat bed without using bedrails?: A Little Help needed moving to and from a bed to a  chair (including a wheelchair)?: A Little Help needed standing up from a chair using your arms (e.g., wheelchair or bedside chair)?: A Little Help needed to walk in hospital room?: A Little Help needed climbing 3-5 steps with a railing? : Total 6 Click Score: 16    End of Session Equipment Utilized During Treatment: Gait belt Activity Tolerance: Patient tolerated treatment well Patient left: in chair;with chair alarm set;with call bell/phone within reach   PT Visit Diagnosis: Other abnormalities of gait and mobility (R26.89);Muscle weakness (generalized) (M62.81)     Time: 8580-8557 PT Time Calculation (min) (ACUTE ONLY): 23 min  Charges:    $Gait Training: 8-22 mins $Therapeutic Activity: 8-22 mins PT General Charges $$ ACUTE PT VISIT: 1 Visit                     [name], PT Acute Rehabilitation Services Office:[phone] 03/12/2024    [name] 03/12/2024, 3:29 PM

## 2024-03-12 NOTE — Progress Notes (Signed)
 Occupational Therapy Treatment Patient Details Name: [name] [name] MRN: [id number] DOB: July 26, 1980 Today's Date: 03/12/2024   History of present illness [name] [name] is a 43 y.o. male who presented from [facility] with chest pain. He was recently treated for COVID-19. PMH includes ESRD on hemodialysis, AV fistula placement (Right, 12/24/2023), HTN, Lupus, Seizures (May 2025), Stroke (May 2025), and Wears glasses.   OT comments  Pt is making limited progress towards their acute OT goals. He remains limited by weakness, R inattention, poor activity tolerance with very poor insight into his deficits. Overall he needed min A for bed mobility, CGA for transfers and min A fro mobility with RW. He could not tolerate standing at the sink for grooming, therefore he sat. Min A needed for grooming tasks due to impaired use of RUE. OT to continue to follow acutely to facilitate progress towards established goals. Pt will continue to benefit from skilled inpatient follow up therapy, <3 hours/day.       If plan is discharge home, recommend the following:  A lot of help with walking and/or transfers;A lot of help with bathing/dressing/bathroom;Assistance with cooking/housework;Direct supervision/assist for financial management;Direct supervision/assist for medications management;Assist for transportation;Help with stairs or ramp for entrance   Equipment Recommendations  None recommended by OT       Precautions / Restrictions Precautions Precautions: Fall Recall of Precautions/Restrictions: Intact Precaution/Restrictions Comments: watch O2 Restrictions Weight Bearing Restrictions Per Provider Order: No       Mobility Bed Mobility Overal bed mobility: Needs Assistance Bed Mobility: Supine to Sit     Supine to sit: Min assist     General bed mobility comments: min A to elevate trunk    Transfers Overall transfer level: Needs assistance Equipment used: Rolling walker (2  wheels) Transfers: Sit to/from Stand Sit to Stand: Contact guard assist           General transfer comment: Min A at times for ambulation     Balance Overall balance assessment: Needs assistance Sitting-balance support: Feet supported Sitting balance-Leahy Scale: Fair     Standing balance support: Single extremity supported, During functional activity Standing balance-Leahy Scale: Poor                             ADL either performed or assessed with clinical judgement   ADL Overall ADL's : Needs assistance/impaired     Grooming: Minimal assistance Grooming Details (indicate cue type and reason): needed assist with use of R hand                 Toilet Transfer: Minimal assistance;Ambulation;Rolling walker (2 wheels)           Functional mobility during ADLs: Minimal assistance;Rolling walker (2 wheels) General ADL Comments: assist needed for RW management, safety, activity tolerance. pt had to sit at the sink for grooming    Extremity/Trunk Assessment Upper Extremity Assessment Upper Extremity Assessment: RUE deficits/detail;LUE deficits/detail RUE Deficits / Details: edematous throughout, painful ROM and PROM. Pt states he is RHD, however he uses his L hand for all functional tasks. RUE Coordination: decreased fine motor;decreased gross motor   Lower Extremity Assessment Lower Extremity Assessment: Generalized weakness        Vision   Vision Assessment?: Vision impaired- to be further tested in functional context   Perception Perception Perception: Impaired Preception Impairment Details: Inattention/Neglect Perception-Other Comments: R inattention   Praxis Praxis Praxis: Impaired Praxis Impairment Details: Motor planning;Organization  Communication Communication Communication: No apparent difficulties   Cognition Arousal: Alert Behavior During Therapy: Flat affect Cognition: No family/caregiver present to determine baseline,  Cognition impaired     Awareness: Online awareness impaired Memory impairment (select all impairments): Non-declarative long-term memory Attention impairment (select first level of impairment): Selective attention Executive functioning impairment (select all impairments): Organization, Sequencing, Problem solving, Reasoning OT - Cognition Comments: pt remains flat and needs encouaragement to participate. He needs increased time for processing, initation and sequencing functional tasks.                 Following commands: Impaired Following commands impaired: Only follows one step commands consistently      Cueing   Cueing Techniques: Verbal cues, Visual cues, Tactile cues  Exercises      Shoulder Instructions       General Comments VSS - pt continues to state he is going home with his mom (desite weakenss, assist level and not having contact with his mom)    Pertinent Vitals/ Pain       Pain Assessment Pain Assessment: No/denies pain Pain Intervention(s): Limited activity within patient's tolerance, Monitored during session  Home Living                                          Prior Functioning/Environment              Frequency  Min 2X/week        Progress Toward Goals  OT Goals(current goals can now be found in the care plan section)  Progress towards OT goals: Progressing toward goals  Acute Rehab OT Goals Patient Stated Goal: to go home OT Goal Formulation: With patient Potential to Achieve Goals: Good ADL Goals Pt Will Perform Grooming: with modified independence;standing Pt Will Perform Upper Body Dressing: with modified independence Pt Will Perform Lower Body Dressing: with supervision;sit to/from stand Pt Will Transfer to Toilet: with modified independence;ambulating Additional ADL Goal #1: Pt will navigate busy hospital environment while independently scanning to the R for safety and awareness  Plan       Co-evaluation                 AM-PAC OT 6 Clicks Daily Activity     Outcome Measure   Help from another person eating meals?: A Little Help from another person taking care of personal grooming?: A Little Help from another person toileting, which includes using toliet, bedpan, or urinal?: A Lot Help from another person bathing (including washing, rinsing, drying)?: A Lot Help from another person to put on and taking off regular upper body clothing?: A Little Help from another person to put on and taking off regular lower body clothing?: A Lot 6 Click Score: 15    End of Session Equipment Utilized During Treatment: Gait belt;Rolling walker (2 wheels);Oxygen  OT Visit Diagnosis: Unsteadiness on feet (R26.81);Other abnormalities of gait and mobility (R26.89);Muscle weakness (generalized) (M62.81)   Activity Tolerance Patient limited by fatigue   Patient Left in bed;with call bell/phone within reach;with bed alarm set   Nurse Communication Mobility status        Time: 1130-1155 OT Time Calculation (min): 25 min  Charges: OT General Charges $OT Visit: 1 Visit OT Treatments $Self Care/Home Management : 23-37 mins  Lucie Kendall, OTR/L Acute Rehabilitation Services Office [phone] Secure Chat Communication Preferred   Lucie JONETTA Kendall 03/12/2024, 1:28 PM

## 2024-03-12 NOTE — NC FL2 (Signed)
 [place]  MEDICAID FL2 LEVEL OF CARE FORM     IDENTIFICATION  Patient Name: [name] [name] Birthdate: 12/02/1980 Sex: male Admission Date (Current Location): 03/07/2024  [facility] and [place] Number:  Producer, television/film/video and Address:  [facility] [place], [address], [place], [place] [address]      Provider Number: [id number]  Attending Physician Name and Address:  Eben Reyes BROCKS, MD  Relative Name and Phone Number:       Current Level of Care: Hospital Recommended Level of Care: Skilled Nursing Facility Prior Approval Number:    Date Approved/Denied:   PASRR Number: [id number]  Discharge Plan: SNF    Current Diagnoses: Patient Active Problem List   Diagnosis Date Noted   Cellulitis of right upper extremity 03/09/2024   Chest pain 03/08/2024   Chronic hypoxic respiratory failure, on home oxygen therapy (HCC) 03/08/2024   Right arm pain 03/08/2024   Hypercoagulable state 03/08/2024   AVF (arteriovenous fistula) 03/08/2024   Hypoalbuminemia 01/09/2024   Aplastic anemia 01/07/2024   Mitral stenosis 01/07/2024   Chronic health problem 01/06/2024   Prolonged Q-T interval on ECG 01/06/2024   Acute encephalopathy 10/21/2023   Acute metabolic encephalopathy 10/21/2023   History of stroke 08/01/2023   Cerebrovascular accident (CVA) due to embolism of cerebral artery (HCC) 04/04/2023   Seizure (HCC) 01/19/2023   Acute respiratory failure with hypoxia (HCC) 01/19/2023   Status epilepticus (HCC) 08/10/2022   ESRD on hemodialysis (HCC) 02/15/2022   Other disorders of phosphorus metabolism 08/18/2021   Volume overload 05/10/2021   Allergy, unspecified, initial encounter 04/19/2021   Anaphylactic shock, unspecified, sequela 04/19/2021   Pruritus, unspecified 04/19/2021   Hypocalcemia 04/01/2021   Long-term current use of anticonvulsant 08/04/2020   COVID-19 07/26/2020   Breakthrough seizure (HCC) 07/20/2020   Acute respiratory failure with  hypoxia (HCC)    Lupus    Seizure, late effect of stroke (HCC) 08/26/2019   Secondary hyperparathyroidism of renal origin 11/05/2018   Macrocytic anemia 09/23/2018   ESRD (end stage renal disease) on dialysis (HCC) 03/18/2018   Prediabetes 01/10/2018   Chest pain in adult    CKD (chronic kidney disease) stage 3, GFR 30-59 ml/min (HCC) 10/24/2015   Seizures (HCC) 10/24/2015   Essential hypertension 01/12/2015   Chronic anticoagulation 04/24/2011   Lupus anticoagulant positive 04/24/2011   OBESITY, NOS 08/15/2006   Thrombocytopenia 08/15/2006   TOBACCO DEPENDENCE 08/15/2006   CVA 08/15/2006    Orientation RESPIRATION BLADDER Height & Weight     Self, Situation, Place  Normal Continent Weight: 240 lb 15.4 oz (109.3 kg) Height:  6' (182.9 cm)  BEHAVIORAL SYMPTOMS/MOOD NEUROLOGICAL BOWEL NUTRITION STATUS      Incontinent Diet (Please refer to dc summary)  AMBULATORY STATUS COMMUNICATION OF NEEDS Skin   Limited Assist Verbally Normal                       Personal Care Assistance Level of Assistance  Bathing, Dressing, Feeding Bathing Assistance: Limited assistance Feeding assistance: Independent Dressing Assistance: Limited assistance     Functional Limitations Info  Sight, Hearing, Speech Sight Info: Adequate Hearing Info: Adequate Speech Info: Adequate    SPECIAL CARE FACTORS FREQUENCY  PT (By licensed PT), OT (By licensed OT)     PT Frequency: 5x/week OT Frequency: 5x/week            Contractures Contractures Info: Not present    Additional Factors Info  Code Status, Allergies Code Status Info:  FULL Allergies Info: Cetirizine & Related, Hibiclens  (chlorhexidine  Gluconate)           Current Medications (03/12/2024):  This is the current hospital active medication list Current Facility-Administered Medications  Medication Dose Route Frequency Provider Last Rate Last Admin   acetaminophen  (TYLENOL ) tablet 1,000 mg  1,000 mg Oral Q6H WA Norrine Sharper, MD   1,000 mg at 03/12/24 0831   alteplase  (CATHFLO ACTIVASE ) injection 2 mg  2 mg Intracatheter Once PRN Geralynn Charleston, MD       anticoagulant sodium citrate  solution 5 mL  5 mL Intracatheter PRN Geralynn Charleston, MD       apixaban  (ELIQUIS ) tablet 5 mg  5 mg Oral BID McLendon, Michael, MD   5 mg at 03/11/24 2120   aspirin  chewable tablet 81 mg  81 mg Oral Daily McLendon, Michael, MD   81 mg at 03/11/24 0908   atorvastatin  (LIPITOR) tablet 40 mg  40 mg Oral Daily McLendon, Michael, MD   40 mg at 03/11/24 0908   Darbepoetin Alfa  (ARANESP ) injection 100 mcg  100 mcg Subcutaneous Q Wed-1800 Schertz, Robert, MD   100 mcg at 03/11/24 2126   divalproex  (DEPAKOTE ) DR tablet 1,000 mg  1,000 mg Oral Q12H McLendon, Michael, MD   1,000 mg at 03/11/24 2121   feeding supplement (NEPRO CARB STEADY) liquid 237 mL  237 mL Oral PRN Geralynn Charleston, MD       heparin  injection 1,000 Units  1,000 Units Intracatheter PRN Geralynn Charleston, MD   3,800 Units at 03/11/24 2002   heparin  injection 4,500 Units  40 Units/kg Dialysis PRN Tobie Gordy POUR, MD       levETIRAcetam  (KEPPRA ) tablet 500 mg  500 mg Oral BID McLendon, Michael, MD   500 mg at 03/11/24 2120   lidocaine  (PF) (XYLOCAINE ) 1 % injection 5 mL  5 mL Intradermal PRN Fritz Belvie DEL, NP       lidocaine -prilocaine  (EMLA ) cream 1 Application  1 Application Topical PRN Fritz Belvie DEL, NP       multivitamin with minerals tablet 1 tablet  1 tablet Oral Daily McLendon, Michael, MD   1 tablet at 03/11/24 0908   pentafluoroprop-tetrafluoroeth (GEBAUERS) aerosol 1 Application  1 Application Topical PRN Fritz Belvie DEL, NP       sevelamer  carbonate (RENVELA ) tablet 2,400 mg  2,400 mg Oral TID WC Patel, Jay K, MD   2,400 mg at 03/12/24 0831   vancomycin  (VANCOCIN ) IVPB 1000 mg/200 mL premix  1,000 mg Intravenous Q M,W,F-HD Lola Massie LABOR, RPH   Stopped at 03/11/24 2233     Discharge Medications: Please see discharge summary for a list of  discharge medications.  Relevant Imaging Results:  Relevant Lab Results:   Additional Information SS#: [id number]; HD MWF at [facility] at 7:15 am  Sherline Clack, LCSWA

## 2024-03-12 NOTE — Progress Notes (Signed)
 PT Cancellation Note  Patient Details Name: [name] [name] MRN: [id number] DOB: 17-Dec-1980   Cancelled Treatment:    Reason Eval/Treat Not Completed: Fatigue/lethargy limiting ability to participate; refusing PT this am requesting to sleep.  Will attempt later as schedule permits.    [name] 03/12/2024, 11:13 AM [name], PT Acute Rehabilitation Services Office:[phone] 03/12/2024

## 2024-03-12 NOTE — Progress Notes (Signed)
 OT Cancellation Note  Patient Details Name: [name] [name] MRN: [id number] DOB: [birth year]   Cancelled Treatment:    Reason Eval/Treat Not Completed: Patient declined, no reason specified (Just want to sleep. Pt holding eyes closed, not responding to therapist. OT to continue efforts as able.)  Lucie JONETTA Kendall 03/12/2024, 9:42 AM

## 2024-03-12 NOTE — Progress Notes (Signed)
 HD#3 SUBJECTIVE:  Patient Summary: [name] [name] is a 43 y.o. with a pertinent PMH of ESRD on HD, antiphospholipid antibody syndrome, and previous CVA who presented with chest pain and admitted for hemodialysis and worsening RUE edema, which is improving.   Overnight Events: Per night team around 2200, BP dropped to 88/56 with a MAP of 65. SpO2 dropped to 88-89% on RA, and was given 2.5L [place] to increase SpO2 to over 95%. He had returned from HD at 2100, and the patient reports his BP will sometimes drop low both during and after HD. He felt well and unchanged from his status during the day when he was seen by the morning IMTS team.  Interim History: Today, the patient reports feeling well. He has no complaints, is not SOB, denies HA, chest pain, or cough. When discussing prior authorization for SNF placement, patient voiced concerns about returning to [facility]. He would prefer to go to another SNF if possible, but does not want to return to his previous institution.   OBJECTIVE:  Vital Signs: Vitals:   03/12/24 0026 03/12/24 0028 03/12/24 0330 03/12/24 0751  BP: (!) 84/60 (!) 85/68 101/69 94/62  Pulse: 76 80 79 75  Resp:   20 18  Temp:   98.2 F (36.8 C) 98.3 F (36.8 C)  TempSrc:      SpO2: 96% 93% 91% 94%  Weight:      Height:       Supplemental O2: Nasal Cannula SpO2: 94 % O2 Flow Rate (L/min): 2.5 L/min  Filed Weights   03/11/24 0500 03/11/24 1720 03/11/24 2102  Weight: 112.2 kg 111.4 kg 109.3 kg     Intake/Output Summary (Last 24 hours) at 03/12/2024 1019 Last data filed at 03/12/2024 0320 Gross per 24 hour  Intake 202.65 ml  Output 2000 ml  Net -1797.35 ml    Net IO Since Admission: -8,117.14 mL [03/12/24 1019]  Physical Exam: Physical Exam Constitutional:      Appearance: He is obese.  Eyes:     Pupils: Pupils are equal, round, and reactive to light.  Cardiovascular:     Rate and Rhythm: Normal rate and regular rhythm.     Pulses:          Radial  pulses are 2+ on the right side and 2+ on the left side.     Heart sounds: Normal heart sounds.  Pulmonary:     Effort: Pulmonary effort is normal. No respiratory distress.     Breath sounds: Normal breath sounds.  Chest:     Chest wall: No tenderness.  Abdominal:     General: Bowel sounds are normal.     Palpations: Abdomen is soft.     Tenderness: There is no abdominal tenderness.  Musculoskeletal:     Cervical back: Normal range of motion.     Comments: RUE less erythematous, edematous, indurated, or warm to the touch than prior. Still without purulence or drainage. Ace bandages from fingers to elbow placed. Palpable upper arm thrill from AV fistula present.   Skin:    General: Skin is warm and dry.  Neurological:     General: No focal deficit present.     Mental Status: He is alert.  Psychiatric:        Mood and Affect: Mood normal.     Patient Lines/Drains/Airways Status     Active Line/Drains/Airways     Name Placement date Placement time Site Days   Fistula / Graft Right Upper  arm Arteriovenous fistula 12/24/23  0808  Upper arm  76   Hemodialysis Catheter Right 01/09/24  0927  --  60   Wound 12/24/23 0810 Surgical Closed Surgical Incision Arm Right 12/24/23  0810  Arm  76            Pertinent labs and imaging:      Latest Ref Rng & Units 03/11/2024    4:55 AM 03/10/2024   10:14 AM 03/08/2024   12:17 AM  CBC  WBC 4.0 - 10.5 K/uL 6.3  8.3  8.1   Hemoglobin 13.0 - 17.0 g/dL 89.6  89.3  9.6   Hematocrit 39.0 - 52.0 % 31.4  33.1  30.3   Platelets 150 - 400 K/uL 187  236  166        Latest Ref Rng & Units 03/11/2024    4:55 AM 03/10/2024   10:14 AM 03/09/2024    4:45 AM  CMP  Glucose 70 - 99 mg/dL 73  885  68   BUN 6 - 20 mg/dL 53  36  54   Creatinine 0.61 - 1.24 mg/dL 86.35  89.25  85.19   Sodium 135 - 145 mmol/L 135  131  135   Potassium 3.5 - 5.1 mmol/L 4.8  4.0  4.4   Chloride 98 - 111 mmol/L 95  94  93   CO2 22 - 32 mmol/L 23  22  25    Calcium  8.9 -  10.3 mg/dL 9.5  9.2  9.1     No results found.  ASSESSMENT/PLAN:  Assessment: Principal Problem:   Cellulitis of right upper extremity Active Problems:   Essential hypertension   ESRD (end stage renal disease) on dialysis (HCC)   Seizure, late effect of stroke (HCC)   Macrocytic anemia   ESRD on hemodialysis (HCC)   Chest pain   Chronic hypoxic respiratory failure, on home oxygen therapy (HCC)   Right arm pain   Hypercoagulable state   AVF (arteriovenous fistula)  [name] [name] is a 43 y.o. with a pertinent PMH of ESRD on HD, antiphospholipid antibody syndrome, and previous CVA who presented with chest pain and admitted for hemodialysis and worsening RUE edema.   Plan: #RUE Edema #Suspected Cellulitis vs Venous Occlusive Event Ongoing issue since AVF revision in early September. Vascular surgery consulted and following. Continuing IV Vancomycin  (day 3) for concerns of cellulitis. VVS encouraging compression. CT Venogram shows no central venous occlusion events and a patent fistula. Patient RUE compressed in ace bandage today, showing marked improvement. Will continue to compress and elevate. Patient will follow with vascular surgery for fistulogram in a few weeks in the outpatient setting.  - Continue elevation, compression of RUE, monitor closely - IV Vancomycin  per pharmacy (day 3) - VVS following  #Physical Deconditioning PT and OT consulted yesterday to assess if patient requires continued assistance at long term care facility, or if he is able to discharge to home. Both recommend CIR before discharging home. Rehab admission coordinator consulted, however patient is not considered a candidate at this time. OT recommending rollator walker and bedside commode if patient is to discharge home. PT and OT will continue to see patient while in acute hospital setting. Discussed with patient today the importance of rehabilitating to improve his chances of needing readmission, and will  return to SNF at discharge once prior authorization is complete.  - Appreciate PT/OT recommendations - Pending SNF authorization.  #ESRD on HD MWF #Electrolyte Derangements. Chronic. On HD MWF, received dialysis  this morning at bedside. Patient to continue outpatient HD schedule, due tomorrow. Patient is to be on renal diet with fluid restriction. Phosphorus elevated at 8.1. - Continue HD as scheduled - Start Darbe 100mcg weekly on 9/24 - Renal Function Panel, CBC in AM  #Chest Pain, resolved #COVID-19 Patient reports no chest pain today, no reproducible pain to palpation on exam. Troponins elevated on admission but at baseline and flat. Patient is denies persistent coughing. Maintaining 93% on RA. -- Consider antitussives if coughing worsens -- Wean O2 as tolerated  #Antiphospholipid Syndrome #Hx of CVAs, multiple Chronic. Previously followed with hematology. Currently on Eliquis  5mg  BID, but previously nonadherent. Better adherence now, but suboptimal treatment on Eliquis . Has been started on Warfarin multiple times previously, but due to nonadherence was switched to Eliquis .  Patient wants to be discharged to home and not return to SNF. PT OT recommending CIR before discharge, will keep on Eliquis  for now.  - Continue Eliquis  5mg   # Macrocytic Anemia  Secondary to chronic ESRD. Hgb today pending. Nephrology recommends starting Darbe on 9/24 at 100mcg weekly sq to chronically low hemoglobin and hematocrit. Vitamin B12 elevated at 948, folate normal at 14.1. Suspect reduced renal clearance of vitamin B12 given ESRD. - Monitor CBC  #Seizure Disorder Last seizure 10/25/2023. Previously nonadherent to medication, but since living in SNF has better adherence.  - Continue home Keppra  500mg   #Hypertension Hypotension BP today 130/62. Patient has overnight event where BP dropped to 88/56 with a MAP of 65 after HD pulled off 2L yesterday and 3L the day before. SpO2 dropped to 88-89% while on  RA, for which he was placed back on 2.5L [place]. Patient reports no symptoms, and there were no signs of infection, lungs clear to auscultation, and patient was able to wean off of [place] back to RA, comfortably sustaining >95%. Will hold antihypertensives today in the setting of low BP overnight. - Hold losartan  50mg  daily and Toprol  XL 25mg    Best Practice: Diet: Renal diet, with fluid restriction VTE: Heparin , DOAC Code: Full  Disposition planning: Therapy Recs: SNF, living long term DISPO: Anticipated discharge tomorrow to Skilled nursing facility pending prior authorization.  Signature:  Channie Bostick, DO Internal Medicine Resident, PGY-1 Please contact the on call pager at [phone] for any urgent or emergent needs. 10:19 AM 03/12/2024

## 2024-03-12 NOTE — Plan of Care (Signed)
  Problem: Clinical Measurements: Goal: Diagnostic test results will improve Outcome: Progressing Goal: Respiratory complications will improve Outcome: Progressing Goal: Cardiovascular complication will be avoided Outcome: Progressing   Problem: Activity: Goal: Risk for activity intolerance will decrease Outcome: Progressing   Problem: Nutrition: Goal: Adequate nutrition will be maintained Outcome: Progressing   Problem: Coping: Goal: Level of anxiety will decrease Outcome: Progressing   Problem: Education: Goal: Knowledge of General Education information will improve Description: Including pain rating scale, medication(s)/side effects and non-pharmacologic comfort measures Outcome: Not Progressing   Problem: Health Behavior/Discharge Planning: Goal: Ability to manage health-related needs will improve Outcome: Not Progressing   Problem: Clinical Measurements: Goal: Will remain free from infection Outcome: Not Progressing

## 2024-03-13 ENCOUNTER — Other Ambulatory Visit (HOSPITAL_COMMUNITY): Payer: Self-pay

## 2024-03-13 LAB — RENAL FUNCTION PANEL
Albumin: 3 g/dL — ABNORMAL LOW (ref 3.5–5.0)
Anion gap: 17 — ABNORMAL HIGH (ref 5–15)
BUN: 60 mg/dL — ABNORMAL HIGH (ref 6–20)
CO2: 25 mmol/L (ref 22–32)
Calcium: 9.5 mg/dL (ref 8.9–10.3)
Chloride: 95 mmol/L — ABNORMAL LOW (ref 98–111)
Creatinine, Ser: 15.08 mg/dL — ABNORMAL HIGH (ref 0.61–1.24)
GFR, Estimated: 4 mL/min — ABNORMAL LOW (ref >60)
Glucose, Bld: 117 mg/dL — ABNORMAL HIGH (ref 70–99)
Phosphorus: 8.8 mg/dL — ABNORMAL HIGH (ref 2.5–4.6)
Potassium: 4.3 mmol/L (ref 3.5–5.1)
Sodium: 137 mmol/L (ref 135–145)

## 2024-03-13 LAB — VANCOMYCIN, RANDOM: Vancomycin Rm: 36 ug/mL

## 2024-03-13 MED ORDER — HEPARIN SODIUM (PORCINE) 1000 UNIT/ML DIALYSIS
40.0000 [IU]/kg | INTRAMUSCULAR | Status: DC | PRN
  Administered 2024-03-13: 4400 [IU] via INTRAVENOUS_CENTRAL

## 2024-03-13 MED ORDER — MIDODRINE HCL 5 MG PO TABS
5.0000 mg | ORAL_TABLET | Freq: Once | ORAL | Status: AC
  Administered 2024-03-13: 5 mg via ORAL

## 2024-03-13 MED ORDER — HEPARIN SODIUM (PORCINE) 1000 UNIT/ML IJ SOLN
INTRAMUSCULAR | Status: AC
  Filled 2024-03-13: qty 5

## 2024-03-13 MED ORDER — SODIUM CHLORIDE 0.9% FLUSH
10.0000 mL | Freq: Two times a day (BID) | INTRAVENOUS | Status: DC
  Administered 2024-03-13: 10 mL

## 2024-03-13 MED ORDER — ALBUMIN HUMAN 25 % IV SOLN
12.5000 g | Freq: Once | INTRAVENOUS | Status: AC
  Administered 2024-03-13: 12.5 g via INTRAVENOUS

## 2024-03-13 MED ORDER — HEPARIN SODIUM (PORCINE) 1000 UNIT/ML IJ SOLN
INTRAMUSCULAR | Status: AC
  Filled 2024-03-13: qty 4

## 2024-03-13 MED ORDER — CHLORHEXIDINE GLUCONATE CLOTH 2 % EX PADS: 6.0000 | MEDICATED_PAD | Freq: Every day | CUTANEOUS | Status: DC

## 2024-03-13 MED ORDER — ALBUMIN HUMAN 25 % IV SOLN
INTRAVENOUS | Status: AC
  Filled 2024-03-13: qty 100

## 2024-03-13 MED ORDER — SODIUM CHLORIDE 0.9% FLUSH: 10.0000 mL | INTRAVENOUS | Status: DC | PRN

## 2024-03-13 MED ORDER — MIDODRINE HCL 5 MG PO TABS
ORAL_TABLET | ORAL | Status: AC
  Filled 2024-03-13: qty 1

## 2024-03-13 NOTE — Progress Notes (Signed)
 Patient ID: [name] [name], male   DOB: [birth year], 43 y.o.   MRN: [id number] [facility] Progress Note   Assessment/ Plan:   1.  Chest pain: Suspect that this was largely related to volume overload/pulmonary edema with negative markers for ACS and has improved with UF/HD. 2. ESRD: Usually on MWF dialysis schedule and will order for hemodialysis again today per his outpatient schedule.  His hypotension is likely to limit ultrafiltration with hemodialysis. 3. Anemia: Low hemoglobin/hematocrit, slight uptrend noted in hospital and will defer ESA 4. CKD-MBD: Calcium  level currently at goal, will order phosphorus level to be done with today's labs. 5. Nutrition: Continue renal diet with fluid restriction 6. Hypertension: Blood pressure marginally elevated, monitor with hemodialysis/UF 7.  Right arm swelling: Appears to be predominantly due to venous hypertension with ipsilateral fistula and right IJ TDC.  CT venogram without acute lesion and he is empirically on vancomycin  for coverage of cellulitis.  Plans noted by vascular surgery to undertake fistulogram after 2 to 4 weeks to assess for possible central venous stenosis.  Subjective:   Asymptomatic hypotension noted overnight.  States that he is making progress with PT.   Objective:   BP 99/64 (BP Location: Left Arm)   Pulse 70   Temp 98 F (36.7 C) (Oral)   Resp 18   Ht 6' (1.829 m)   Wt 109.3 kg   SpO2 94%   BMI 32.68 kg/m   Physical Exam: Gen: Sleeping in bed, arouses to voice CVS: Pulse regular rhythm, normal rate, S1 and S2 normal Resp: Clear to auscultation bilaterally, no distinct rales or rhonchi Abd: Soft, obese, nontender, bowel sounds normal Ext: Trace bilateral lower extremity edema.  Edematous right upper extremity with palpable upper arm thrill from AV fistula and wrapped in Ace wrap from elbow to hand  Labs: BMET Recent Labs  Lab 03/06/24 1001 03/08/24 0017 03/08/24 1822 03/09/24 0445  03/10/24 1014 03/11/24 0455  NA 136 136 136 135 131* 135  K 4.8 5.2* 5.3* 4.4 4.0 4.8  CL 94* 92* 93* 93* 94* 95*  CO2 26 21* 23 25 22 23   GLUCOSE 73 90 67* 68* 114* 73  BUN 78* 99* 106* 54* 36* 53*  CREATININE 20.23* 22.63* 24.54* 14.80* 10.74* 13.64*  CALCIUM  8.8* 8.9 8.7* 9.1 9.2 9.5  PHOS  --   --   --   --  5.0* 8.1*   CBC Recent Labs  Lab 03/06/24 1001 03/08/24 0017 03/10/24 1014 03/11/24 0455  WBC 7.6 8.1 8.3 6.3  NEUTROABS 5.1  --   --   --   HGB 9.0* 9.6* 10.6* 10.3*  HCT 28.4* 30.3* 33.1* 31.4*  MCV 108.4* 107.4* 105.4* 103.6*  PLT 145* 166 236 187     Medications:     acetaminophen   1,000 mg Oral Q6H WA   apixaban   5 mg Oral BID   aspirin   81 mg Oral Daily   atorvastatin   40 mg Oral Daily   darbepoetin (ARANESP ) injection - DIALYSIS  100 mcg Subcutaneous Q Wed-1800   divalproex   1,000 mg Oral Q12H   levETIRAcetam   500 mg Oral BID   multivitamin with minerals  1 tablet Oral Daily   sevelamer  carbonate  2,400 mg Oral TID WC   sodium chloride  flush  10-40 mL Intracatheter Q12H   Gordy Blanch, MD 03/13/2024, 7:47 AM

## 2024-03-13 NOTE — Telephone Encounter (Signed)
 Pt appt scheduled

## 2024-03-13 NOTE — Progress Notes (Signed)
 PHARMACY ANTIBIOTIC CONSULT NOTE   [name] [name] a 43 y.o. male admitted with chest pain and shortness of breath who also has suspected cellulitis. Patient has ESRD and is on HD MWF. Pharmacy has been consulted for Vancomycin  dosing.  Pre-HD VR 36 (03/13/24)- with currently ordered HD prescription, can expect post-HD level to be ~22 mcg/mL (target 15-25).   Estimated Creatinine Clearance: 8 mL/min (A) (by C-G formula based on SCr of 15.08 mg/dL (H)).  Plan: Hold Vancomycin  9/26 - plan communicated to HD RN  Still able to proceed with last dose of 1000 mg IV Vancomycin  post-HD 9/29  F/U HD schedule/tolerability, clinical status, de-escalation, C/S, levels as indicated   Allergies:  Allergies  Allergen Reactions   Cetirizine & Related Swelling   Hibiclens  [Chlorhexidine  Gluconate] Itching    Filed Weights   03/11/24 1720 03/11/24 2102 03/13/24 1200  Weight: 111.4 kg (245 lb 9.5 oz) 109.3 kg (240 lb 15.4 oz) 110 kg (242 lb 8.1 oz)       Latest Ref Rng & Units 03/11/2024    4:55 AM 03/10/2024   10:14 AM 03/08/2024   12:17 AM  CBC  WBC 4.0 - 10.5 K/uL 6.3  8.3  8.1   Hemoglobin 13.0 - 17.0 g/dL 89.6  89.3  9.6   Hematocrit 39.0 - 52.0 % 31.4  33.1  30.3   Platelets 150 - 400 K/uL 187  236  166     Antibiotics Given (last 72 hours)     Date/Time Action Medication Dose Rate   03/11/24 2125 New Bag/Given  [patient was in dialysis during the scheduled time.]   vancomycin  (VANCOCIN ) IVPB 1000 mg/200 mL premix 1,000 mg 200 mL/hr       Massie Fila, PharmD Clinical Pharmacist  03/13/2024 1:25 PM

## 2024-03-13 NOTE — TOC Progression Note (Addendum)
 Transition of Care [facility]) - Progression Note    Patient Details  Name: [name] [name] MRN: [id number] Date of Birth: 19-Jul-1980  [facility] CM/SW Contact  Sherline Clack, [place] Phone Number: 03/13/2024, 11:45 AM  Clinical Narrative:     Update 4:18 PM: Patient receiving dialysis. DC pending completion of dialysis session and dc summary  CSW spoke with patient at bedside yesterday (9/26) and he did not wish to return to [facility]. Patient requested his information was sent to other facilities. CSW heard back from [facility] and presented patient with the Medicare ratings. Patient identified [facility] as his choice facility for discharge. Insurance authorization for [facility] was changed by phone with [employer] to [employer]. Auth approved 9/25-9/29, auth ID: [id number]. [facility] has a private room available for patient today. Expected discharge is today after patient's HD.   CSW will continue to follow.                     Expected Discharge Plan and Services         Expected Discharge Date: 03/11/24                                     Social Drivers of Health (SDOH) Interventions SDOH Screenings   Food Insecurity: No Food Insecurity (03/08/2024)  Housing: Unknown (03/08/2024)  Transportation Needs: No Transportation Needs (03/08/2024)  Utilities: Not At Risk (03/08/2024)  Alcohol Screen: Low Risk  (03/26/2023)  Depression (PHQ2-9): Low Risk  (03/26/2023)  Financial Resource Strain: Low Risk  (03/26/2023)  Physical Activity: Inactive (03/26/2023)  Social Connections: Patient Declined (01/06/2024)  Recent Concern: Social Connections - Moderately Isolated (10/21/2023)  Stress: No Stress Concern Present (03/26/2023)  Tobacco Use: Medium Risk (03/07/2024)  Health Literacy: Adequate Health Literacy (03/26/2023)    Readmission Risk Interventions    01/08/2024   12:19 PM 03/28/2023    2:57 PM 01/25/2023    3:40 PM  Readmission Risk Prevention Plan  Post  Dischage Appt     Medication Screening     Transportation Screening Complete Complete   Medication Review (RN Care Manager) Complete Referral to Pharmacy   PCP or Specialist appointment within 3-5 days of discharge Complete Complete   HRI or Home Care Consult Complete Complete   SW Recovery Care/Counseling Consult Complete Complete   Palliative Care Screening Not Applicable Not Applicable   Skilled Nursing Facility Complete Not Applicable      Information is confidential and restricted. Go to Review Flowsheets to unlock data.

## 2024-03-13 NOTE — Discharge Planning (Signed)
 [facility] Patient Discharge Orders - [facility] CLINIC: [facility]  Patient's name: [name] [name] Admit/DC Dates: 03/07/2024 - 03/13/24  DISCHARGE DIAGNOSES: RUE cellulitis - finishing course of Vancomycin  - see below COVID-19 (first positive test 9/19 - out of precaution window at this time) Chest pain Hypotension Pulm edema (per imaging)  HD ORDER CHANGES: Heparin  change: no EDW Change: YES New EDW: 111kg.  [place] Change: no  ANEMIA MANAGEMENT: Aranesp : Given: YES   Amount/Date of last dose: 100mcg on 9/24 ESA dose for discharge: mircera 100 mcg IV q 2 weeks, to start on 03/18/24 IV Iron  dose at discharge: per protocol Transfusion: Given: no  BONE/MINERAL MEDICATIONS: Hectorol /Calcitriol  change: no Sensipar /Parsabiv change: no  ACCESS INTERVENTION/CHANGE: no Details:   RECENT LABS: Recent Labs  Lab 03/11/24 0455 03/13/24 1210  HGB 10.3*  --   NA 135 137  K 4.8 4.3  CALCIUM  9.5 9.5  PHOS 8.1* 8.8*  ALBUMIN  2.7* 3.0*    IV ANTIBIOTICS: YES - 1 more dose Vancomycin  1g on Monday 03/16/24 to complete course Details:  OTHER ANTICOAGULATION: On Eliquis  Details:  OTHER/APPTS/LAB ORDERS: - Discharging to SNF [facility]) - BP low towards end of admit, BP meds on hold - follow, may need to consider midodrine .  D/C Meds to be reconciled by nurse after every discharge.  Completed By: Izetta Boehringer, PA-C [facility] Pager [phone]   Reviewed by: MD:______ RN_______

## 2024-03-13 NOTE — TOC Transition Note (Signed)
 Transition of Care [facility]) - Discharge Note   Patient Details  Name: PIKE SCANTLEBURY MRN: [id number] Date of Birth: [birth year]  [facility] CM/SW Contact:  Sherline Clack, LCSWA Phone Number: 03/13/2024, 4:58 PM   Clinical Narrative:     Patient will DC to: [facility] Anticipated DC date: 03/13/24  Family notified: Rashawn/friend Transport by: [place]   Per MD patient ready for DC to [facility]. RN to call report prior to discharge [phone] for room 146 ). RN, patient, patient's family, and facility notified of DC. Discharge Summary and FL2 sent to facility. DC packet on chart. Ambulance transport requested for patient.   CSW will sign off for now as social work intervention is no longer needed. Please consult us  again if new needs arise.    Final next level of care: Skilled Nursing Facility Barriers to Discharge: Barriers Resolved   Patient Goals and CMS Choice     Choice offered to / list presented to : Patient      Discharge Placement                Patient to be transferred to facility by: [facility] Name of family member notified: Rashawn/friend Patient and family notified of of transfer: 03/13/24  Discharge Plan and Services Additional resources added to the After Visit Summary for                                       Social Drivers of Health (SDOH) Interventions SDOH Screenings   Food Insecurity: No Food Insecurity (03/08/2024)  Housing: Unknown (03/08/2024)  Transportation Needs: No Transportation Needs (03/08/2024)  Utilities: Not At Risk (03/08/2024)  Alcohol Screen: Low Risk  (03/26/2023)  Depression (PHQ2-9): Low Risk  (03/26/2023)  Financial Resource Strain: Low Risk  (03/26/2023)  Physical Activity: Inactive (03/26/2023)  Social Connections: Patient Declined (01/06/2024)  Recent Concern: Social Connections - Moderately Isolated (10/21/2023)  Stress: No Stress Concern Present (03/26/2023)  Tobacco Use: Medium Risk  (03/07/2024)  Health Literacy: Adequate Health Literacy (03/26/2023)     Readmission Risk Interventions    01/08/2024   12:19 PM 03/28/2023    2:57 PM 01/25/2023    3:40 PM  Readmission Risk Prevention Plan  Post Dischage Appt     Medication Screening     Transportation Screening Complete Complete   Medication Review (RN Care Manager) Complete Referral to Pharmacy   PCP or Specialist appointment within 3-5 days of discharge Complete Complete   HRI or Home Care Consult Complete Complete   SW Recovery Care/Counseling Consult Complete Complete   Palliative Care Screening Not Applicable Not Applicable   Skilled Nursing Facility Complete Not Applicable      Information is confidential and restricted. Go to Review Flowsheets to unlock data.

## 2024-03-13 NOTE — Plan of Care (Signed)
  Problem: Education: Goal: Knowledge of General Education information will improve Description: Including pain rating scale, medication(s)/side effects and non-pharmacologic comfort measures Outcome: Progressing   Problem: Clinical Measurements: Goal: Diagnostic test results will improve Outcome: Progressing Goal: Respiratory complications will improve Outcome: Progressing Goal: Cardiovascular complication will be avoided Outcome: Progressing   Problem: Activity: Goal: Risk for activity intolerance will decrease Outcome: Progressing   Problem: Nutrition: Goal: Adequate nutrition will be maintained Outcome: Progressing   Problem: Health Behavior/Discharge Planning: Goal: Ability to manage health-related needs will improve Outcome: Not Progressing   Problem: Clinical Measurements: Goal: Will remain free from infection Outcome: Not Progressing

## 2024-03-13 NOTE — Plan of Care (Signed)

## 2024-03-13 NOTE — Discharge Summary (Signed)
 Name: [name] [name] MRN: [id number] DOB: Apr 22, 1981 43 y.o. PCP: Theotis Haze ORN, NP  Date of Admission: 03/07/2024 11:09 PM Date of Discharge: 03/13/2024  Attending Physician: Dr. Reyes Fenton  Discharge Diagnosis: Principal Problem:   Cellulitis of right upper extremity Active Problems:   Essential hypertension   ESRD (end stage renal disease) on dialysis [facility])   Seizure, late effect of stroke (HCC)   Macrocytic anemia   ESRD on hemodialysis (HCC)   Chest pain   Chronic hypoxic respiratory failure, on home oxygen therapy (HCC)   Right arm pain   Hypercoagulable state   AVF (arteriovenous fistula)  Hypoalbuminuria Hyperphosphatemia COVID-19  Discharge Medications: Allergies as of 03/13/2024       Reactions   Cetirizine & Related Swelling   Hibiclens  [chlorhexidine  Gluconate] Itching        Medication List     PAUSE taking these medications    losartan  50 MG tablet Wait to take this until your doctor or other care provider tells you to start again. Commonly known as: COZAAR  Take 50 mg by mouth daily.   metoprolol  succinate 25 MG 24 hr tablet Wait to take this until your doctor or other care provider tells you to start again. Commonly known as: TOPROL -XL Take 1 tablet (25 mg total) by mouth daily.       STOP taking these medications    cephALEXin  500 MG capsule Commonly known as: KEFLEX    diphenhydrAMINE  25 mg capsule Commonly known as: BENADRYL    guaiFENesin -dextromethorphan 100-10 MG/5ML syrup Commonly known as: ROBITUSSIN DM   molnupiravir EUA 200 MG Caps capsule Commonly known as: LAGEVRIO       TAKE these medications    acetaminophen  325 MG tablet Commonly known as: TYLENOL  Take 2 tablets (650 mg total) by mouth every 6 (six) hours as needed for mild pain.   apixaban  5 MG Tabs tablet Commonly known as: Eliquis  Take 1 tablet (5 mg total) by mouth 2 (two) times daily.   aspirin  81 MG chewable tablet Chew 1 tablet (81 mg total)  by mouth daily.   atorvastatin  40 MG tablet Commonly known as: LIPITOR Take 1 tablet (40 mg total) by mouth daily.   Auryxia  1 GM 210 MG(Fe) tablet Generic drug: ferric citrate  Take 840 mg by mouth See admin instructions. Take 840 mg by mouth every evening on Monday, Wednesday, and Friday. Take 840 mg by mouth three times daily on Tuesday, Thursday, Saturday, and Sunday.   cyclobenzaprine  10 MG tablet Commonly known as: FLEXERIL  Take 1 tablet (10 mg total) by mouth 2 (two) times daily as needed for muscle spasms.   divalproex  500 MG DR tablet Commonly known as: DEPAKOTE  Take 2 tablets (1,000 mg total) by mouth every 12 (twelve) hours.   imiquimod  5 % cream Commonly known as: Aldara  Apply to affected area three times weekly for 8 weeks. What changed:  how much to take how to take this when to take this additional instructions   levETIRAcetam  500 MG tablet Commonly known as: Keppra  Take 1 tablet (500 mg total) by mouth 2 (two) times daily.   multivitamin with minerals tablet Take 1 tablet by mouth daily.   nitroGLYCERIN  0.4 MG SL tablet Commonly known as: NITROSTAT  Place 0.4 mg under the tongue every 5 (five) minutes as needed for chest pain.               Durable Medical Equipment  (From admission, onward)  Start     Ordered   03/11/24 0000  DME Bedside commode       Question:  Patient needs a bedside commode to treat with the following condition  Answer:  Physical deconditioning   03/11/24 1440   03/11/24 0000  For home use only DME 4 wheeled rolling walker with seat       Question:  Patient needs a walker to treat with the following condition  Answer:  Mobility impaired   03/11/24 1440            Disposition and follow-up:   JulianIsai L [name] was discharged from [facility] in Stable condition.  At the hospital follow up visit please address:  1.  Follow-up:  a. Patient receiving IV Vancomycin  with his dialysis sessions  until Monday, 9/28. This is to help clear his cellulitis on his RUE.                          b. His RUE swelling and erythema. It was improving with elevation, compression, and antibiotics. Please continue to ensure the patient is elevating and compressing his RUE until resolution of his infection.    C. Patient's hypotension. He has a history of hypertension for which he was on Losartan  50mg  and Metoprolol  Succinate 25mg . During admission, he consistently sustained asymptomatic, low BP, especially during and after HD. His antihypertensives were held at discharge.  2.  Labs / imaging needed at time of follow-up: CBC, Renal Function Panel  Follow-up Appointments: to be made within 1 week of discharge  Contact information for after-discharge care     Destination     [facility] .   Service: Skilled Nursing Contact information: [address] [place]  [phone]                     Hospital Course by problem list: [name] [name] is a 43 y.o. person living with a history of ESRD on HD, antiphospholipid syndrome, seizure disorder, SLE, prior CVA's, and obesity who presented with chest pain and admitted for chest pain work up.  #Chest Pain, resolved #COVID-19 The patient presented with left 1 day history of chest pain.  He reported that it was centrally located without radiation and described as both sharp and stabbing.  He had resolved by the time he was evaluated by the admitting team in the ER. Troponin elevated however at baseline and flat. EKG nonconcerning for acute infarct or ischemic event. Patient did test positive for COVID last week and has been coughing since. Suspected this was contributing to his chest pain. Over the course of his hospitalization, he remained chest pain free and had no reproducible chest pain to palpation. Nephrology suspected overall chest pain was due to volume overload and pulmonary edema, as the patient had missed 2  sessions of dialysis last week. Remained without respiratory distress, pt was on 3L [place] on initial evaluation, weaned as tolerated to 93% on RA.   #Physical Deconditioning PT and OT consulted to assess if patient required continued assistance at long term care facility, or if he was able to discharge to home. Both recommend CIR before discharging home. Rehab admission coordinator consulted, however the patient is not a candidate for CIR level rehab. OT recommending rollator walker and bedside commode if patient is to discharge home. PT/OT following. Discussed with patient today the importance of rehabilitating at home to decrease chances of readmission.   #  RUE Edema #Suspected Cellulitis  #AV Fistula Patient presented to the ED initially on 9/14 with right upper extremity swelling that was considered to be secondary to his recent vascular procedure on 9/9.  Ace wrap and outpatient follow-up was recommended at this time. On 9/19 he presented yet again to the ER with worsening symptoms. At this time, suspected to be cellulitis secondary to his recent procedure. He was discharged on a 7-day course of Keflex . During this admission, his proximal right upper extremity was found to have pitting edema to the fingertips, induration, erythema, and warmth to the touch. Vascular surgery was consulted and followed. US  of AVF on 9/19 showed a complex structure measuring 6.9 x 2.6 x 6.5 in the medial proximal upper arm, likely indicative of a hematoma. Venous Duplex without signs of DVT. Suspected edema may be secondary to cellulitis vs venous occlusive event. Patient remained afebrile and without leukocytosis, so low concern for systemic infection. He was placed on  IV Vancomycin  per pharmacy, where he completed 3 doses while inpatient. He will need to complete another 7 days with HD after discharge. Vascular surgery recommended continued elevation and compression with close monitoring for resolution, as well as a CT  Venogram of the RUE. CT Venogram showed no signs of central venous occlusive events and the fistula is widely patent. Patient will follow up with vascular in the outpatient setting if he needs fistulogram.    #ESRD on HD #Electrolyte derangements Patient with longstanding history of ESRD and has been on dialysis for the past 6 years. He did present to the ER on 9/19 after missed dialysis session on Wednesday and was able to undergo dialysis for approximately an hour. He reported difficulty sitting through dialysis due to needing to wear a mask with recent COVID diagnosis. On admission creatinine 22.63, BUN 99, potassium 5.2, CO2 21, anion gap 23. Nephrology consulted, patient had HD on admission night due to pulm edema shown on CXR.  Patient had repeat HD the following day, then was resumed on his normal outpatient HD schedule. Monitored renal function panel while inpatient and [place] was started on 9/24.   #Antiphospholipid syndrome #History of multiple CVAs The patient does have a longstanding history of phospholipid syndrome for which she has followed with hematology in the past however is not currently. He did have multiple admissions over the past year for CVAs. Patient is currently on Eliquis  5 mg twice daily however has been recurrent historically nonadherent. With antiphospholipid syndrome, warfarin is the preferred agent.  Upon chart review it seems as if this patient has been initiated on warfarin multiple times without adherence in the past. During this hospitalization, we continued Eliquis  5 mg and encouraged patient to follow-up with hematology outpatient   #Anemia of chronic disease #Macrocytic anemia The patient does have a long stay history of anemia with baseline hemoglobin 9-10. Patient at baseline on admission with hemoglobin of 9.6. Likely secondary to ESRD. MCV elevation was 107.4.  After chart review it seems like the patient was normocytic until July of this year. Vitamin B12  elevated at 948, folate normal at 14.1. Suspect reduced renal clearance of vitamin B12 given ESRD. Continued monitoring CBC.   #Seizure Disorder Patient does have a well-documented history of recurrent seizures due to nonadherence to outpatient medication. He does live at a long-term care facility and has been about 6 months.  Suspect this is contributing to increased medical adherence. Most recent seizure noted 10/25/2023. Continued Keppra  500 mg twice daily.  #Hypertension #  Hypotension Patient mildly hypertensive on presented to the ER at 145/96. Continued home medications of losartan  50 mg daily and Toprol  XL 25 mg daily, until 9/24, where BP dropped to 88/56 with a MAP of 65. At his previous two consecutive HD sessions, 2L and 3L were pulled off, respectively. Patient reports he will occassionally become hypotensive both during and after HD, but generally feels well. Held antihypertensives  in the setting of low BP, where he continuously remained low at 99/64.  #Transient Hypoxia, resolved At the same time as his hypotensive episode, the patient's SpO2 dropped to 88-89% while on RA, for which he was placed back on 2.5L [place]. Patient reported no symptoms, and there were no signs of infection, lungs clear to auscultation, and patient was able to wean off of [place] back to RA, comfortably sustaining >95%.    Discharge Subjective: Patient reports feeling well today. Denies any HA, SOB, or nausea.  He states that the swelling in his arm has reduced dramatically, and they had recently wrapped his arm to help compress and elevate. He knows he will be discharging today to [facility] and not the [facility].  He is ready for discharge.  Discharge Exam:   BP 93/60   Pulse 84   Temp 98.7 F (37.1 C)   Resp 12   Ht 6' (1.829 m)   Wt 110 kg   SpO2 99%   BMI 32.89 kg/m  Constitutional: tired-appearing male laying in bed, in no acute distress HENT: normocephalic atraumatic, mucous membranes moist Eyes:  conjunctiva non-erythematous Neck: supple Cardiovascular: regular rate and rhythm, no m/r/g Pulmonary/Chest: normal work of breathing on room air, lungs clear to auscultation bilaterally Abdominal: soft, non-tender, non-distended MSK: right upper extremity wrapped in ace bandaging, swelling decreased from previous days. Reduced erythma and induration from admission. Palpable upper arm thrill from AV fistula.  Neurological: alert & oriented to name and place, but not time, spasticity noted in right upper extremity when raising arm.  Skin: warm and dry Psych: normal mood and affect   Pertinent Labs, Studies, and Procedures:     Latest Ref Rng & Units 03/11/2024    4:55 AM 03/10/2024   10:14 AM 03/08/2024   12:17 AM  CBC  WBC 4.0 - 10.5 K/uL 6.3  8.3  8.1   Hemoglobin 13.0 - 17.0 g/dL 89.6  89.3  9.6   Hematocrit 39.0 - 52.0 % 31.4  33.1  30.3   Platelets 150 - 400 K/uL 187  236  166        Latest Ref Rng & Units 03/13/2024   12:10 PM 03/11/2024    4:55 AM 03/10/2024   10:14 AM  CMP  Glucose 70 - 99 mg/dL 882  73  885   BUN 6 - 20 mg/dL 60  53  36   Creatinine 0.61 - 1.24 mg/dL 84.91  86.35  89.25   Sodium 135 - 145 mmol/L 137  135  131   Potassium 3.5 - 5.1 mmol/L 4.3  4.8  4.0   Chloride 98 - 111 mmol/L 95  95  94   CO2 22 - 32 mmol/L 25  23  22    Calcium  8.9 - 10.3 mg/dL 9.5  9.5  9.2     DG Chest 2 View Result Date: 03/08/2024 CLINICAL DATA:  Chest pain EXAM: CHEST - 2 VIEW COMPARISON:  03/06/2024 FINDINGS: Cardiomegaly, vascular congestion. No overt edema. Bibasilar atelectasis with low lung volumes. No effusions or acute bony abnormality. IMPRESSION:  Cardiomegaly, vascular congestion. Low lung volumes with bibasilar atelectasis. Electronically Signed   By: Franky Crease M.D.   On: 03/08/2024 00:05     Discharge Instructions:   Discharge Instructions      LIO WEHRLY,  You were recently admitted to [facility] for chest pain that came from your cough, as well  as needing dialysis and for the swelling in your right upper arm. We made sure you did not have a clot in your fistula, which you do not. You do have an infection called cellulitis, which we treated with IV antibiotics. We would like you to continue get these antibiotics, called Vancomycin , when you go to your dialysis center for the next 7 days after you leave today.   Continue taking your home medications with the following changes:  Start: Vancomycin  IV antibiotics on your dialysis days. There is nothing for you to do with this medicine. Your dialysis center outside of the hospital will arrange this for you to have done on Monday, 9/28. Stop taking: Cephalexin  (Keflex ) 500 mg capsule.  This is the antibiotic you were previously taking before you were hospitalized. Benadryl  25 mg. Robitussin DM for cough. Molunpiravir for COVID.  Pause taking:  Losartan  50mg . This is your blood pressure medicine that you normally take. Please do not take this medicine until you are able to follow up with your primary care doctor. Your blood pressure stayed low when you were in the hospital and we do not want it to get lower. Metoprolol  succinate 25mg . This is your blood pressure medicine that you normally take. Please do not take this medicine until you are able to follow up with your primary care doctor. Your blood pressure stayed low when you were in the hospital and we do not want it to get lower. Continue taking: Tylenol  as needed for pain Eliquis  5 mg tablet, 2 times a day Aspirin  81 mg tablet daily Lipitor 40 mg tablet daily Auryxia  at 1 g tablet every Monday Wednesday Friday after dialysis Flexeril  10 mg tablet twice daily as needed for muscle spasms Depakote  500 mg tablet every 12 hours for seizures Keppra  500 mg tablet, 2 times daily for seizures Losartan  50 mg tablet daily Metoprolol  succinate 25 mg tablet daily Multivitamin Nitroglycerin  0.4 mg sublingual tablet as needed for chest pain  You  should seek further medical care if you notice the swelling in your arm get worse, notice any drainage or pus from your fistula, or start developing any fever  > 101.  We recommend that you see your primary care doctor in about a week to make sure that you continue to improve and to make sure you complete 7 days of Vancomycin  at dialysis.   We are so glad that you are feeling better.  Sincerely, Pryce Folts, DO    Signed: Rual Vermeer, DO Internal Medicine Resident, PGY-1 03/13/2024, 3:03 PM   Please contact the on call pager after 5 pm and on weekends at [phone].

## 2024-03-13 NOTE — Progress Notes (Signed)
 AVS completed for discharge packet and placed with chart.

## 2024-03-13 NOTE — Procedures (Signed)
 HD Note:  Some information was entered later than the data was gathered due to patient care needs. The stated time with the data is accurate.  Received patient in bed to unit.   Alert and oriented to self.   Was cooperative with treatment.  Informed consent signed and in chart.   Access used: Upper right chest HD catheter Access issues: None  Patient SBP was in low 80s upon arriving on the unit. Kathyrn Stovall, PA was notified with new orders received. UF was periodically paused. Midodrine  and Albumen given.  LOIS Boehringer PA was updated about half way through treatment that the patient may not reach goal.  No new orders given.  TX duration: 3.5 hours  Alert, without acute distress post treatment  No UF removed.  600 ml gain resulting from the inability to un pause UF due to BP.  Hand-off given to patient's nurse.   Transported back to the room   Tinslee Klare L. Lenon, RN [facility] Dialysis Unit.

## 2024-03-13 NOTE — Progress Notes (Signed)
 Possible d/c noted. Contacted out-pt HD clinic to inform of possible d/c and to anticipate pt back on Monday. Informed clinic that this pt will need IV Vanc with treatment. No further support needed at this time.   Lavanda Verdie Barrows Dialysis Navigator [phone]

## 2024-03-31 ENCOUNTER — Ambulatory Visit: Payer: 59 | Attending: Nurse Practitioner

## 2024-04-01 ENCOUNTER — Ambulatory Visit

## 2024-04-08 ENCOUNTER — Encounter (HOSPITAL_COMMUNITY): Payer: Self-pay

## 2024-05-05 ENCOUNTER — Encounter (HOSPITAL_COMMUNITY): Payer: Self-pay

## 2024-05-10 ENCOUNTER — Emergency Department (HOSPITAL_COMMUNITY)
Admission: EM | Admit: 2024-05-10 | Discharge: 2024-05-10 | Disposition: A | Discharge status: Home / Self Care | Attending: Emergency Medicine | Admitting: Emergency Medicine

## 2024-05-10 ENCOUNTER — Encounter (HOSPITAL_COMMUNITY): Payer: Self-pay

## 2024-05-10 ENCOUNTER — Other Ambulatory Visit: Payer: Self-pay

## 2024-05-10 DIAGNOSIS — R569 Unspecified convulsions: Secondary | ICD-10-CM | POA: Diagnosis present

## 2024-05-10 DIAGNOSIS — Z7982 Long term (current) use of aspirin: Secondary | ICD-10-CM | POA: Insufficient documentation

## 2024-05-10 DIAGNOSIS — Z992 Dependence on renal dialysis: Secondary | ICD-10-CM | POA: Insufficient documentation

## 2024-05-10 DIAGNOSIS — N186 End stage renal disease: Secondary | ICD-10-CM | POA: Insufficient documentation

## 2024-05-10 DIAGNOSIS — Z8673 Personal history of transient ischemic attack (TIA), and cerebral infarction without residual deficits: Secondary | ICD-10-CM | POA: Diagnosis not present

## 2024-05-10 DIAGNOSIS — Z7901 Long term (current) use of anticoagulants: Secondary | ICD-10-CM | POA: Diagnosis not present

## 2024-05-10 MED ORDER — LEVETIRACETAM (KEPPRA) 500 MG/5 ML ADULT IV PUSH: 1000.0000 mg | Freq: Once | INTRAVENOUS | Status: DC

## 2024-05-10 MED ORDER — DIVALPROEX SODIUM 500 MG PO DR TAB
1000.0000 mg | DELAYED_RELEASE_TABLET | Freq: Two times a day (BID) | ORAL | Status: DC
  Administered 2024-05-10: 1000 mg via ORAL
  Filled 2024-05-10: qty 4

## 2024-05-10 MED ORDER — LEVETIRACETAM 500 MG PO TABS
500.0000 mg | ORAL_TABLET | Freq: Two times a day (BID) | ORAL | Status: DC
  Administered 2024-05-10: 500 mg via ORAL
  Filled 2024-05-10: qty 1

## 2024-05-10 MED ORDER — LORAZEPAM 2 MG/ML IJ SOLN: 2.0000 mg | INTRAMUSCULAR | Status: DC | PRN

## 2024-05-10 NOTE — ED Notes (Signed)
 Dressing in place on R bicep over fistula, no blood on dressing

## 2024-05-10 NOTE — Discharge Instructions (Addendum)
 As discussed, your evaluation today has been largely reassuring.  But, it is important that you monitor your condition carefully, and do not hesitate to return to the ED if you develop new, or concerning changes in your condition. ? ?Otherwise, please follow-up with your physician for appropriate ongoing care. ? ?

## 2024-05-10 NOTE — ED Triage Notes (Signed)
 Pt presents to ED via EMS from dialysis with complaint of focal absence seizure with hypotension. Pt has hx of tonic clonic seizures, on keppra  Postictal on arrival to ED but able to follow commands. Hx of stroke, no deficits, on eliquis .   Dialysis fistula on right still accessed on arrival.

## 2024-05-10 NOTE — ED Notes (Signed)
 IV Team at bedside unable to locate vein sufficient for IV access. Garrick MD made aware.

## 2024-05-10 NOTE — ED Notes (Addendum)
 Previous note documented on incorrect patient

## 2024-05-10 NOTE — ED Provider Notes (Signed)
 [place] EMERGENCY DEPARTMENT AT [facility] Provider Note   CSN: [id number] Arrival date & time: 05/10/24  1056     Patient presents with: Seizures   [name] [name] is a 43 y.o. male.   HPI Patient presents from dialysis via EMS.  Patient postictal clinically, level 5 caveat. Patient has a history of seizure disorder, as well as end-stage renal disease.  Today, at dialysis patient was noticed to have absent seizure-like episode, and concurrently hypotension. He was sent here for evaluation. EMS reports patient's blood pressure improved, was normal in transport, patient was postictal, responding to stimuli otherwise somnolent.     Prior to Admission medications   Medication Sig Start Date End Date Taking? Authorizing Provider  acetaminophen  (TYLENOL ) 325 MG tablet Take 2 tablets (650 mg total) by mouth every 6 (six) hours as needed for mild pain. 02/13/23   Danton Reyes DASEN, MD  apixaban  (ELIQUIS ) 5 MG TABS tablet Take 1 tablet (5 mg total) by mouth 2 (two) times daily. 11/12/23   Gonfa, Taye T, MD  aspirin  81 MG chewable tablet Chew 1 tablet (81 mg total) by mouth daily. 01/13/24   Shitarev, Dimitry, MD  atorvastatin  (LIPITOR) 40 MG tablet Take 1 tablet (40 mg total) by mouth daily. 08/03/23   Darci Pore, MD  AURYXIA  1 GM 210 MG(Fe) tablet Take 840 mg by mouth See admin instructions. Take 840 mg by mouth every evening on Monday, Wednesday, and Friday. Take 840 mg by mouth three times daily on Tuesday, Thursday, Saturday, and Sunday. 10/07/23   [provider]  cyclobenzaprine  (FLEXERIL ) 10 MG tablet Take 1 tablet (10 mg total) by mouth 2 (two) times daily as needed for muscle spasms. 03/01/24   Elnor Jayson LABOR, DO  divalproex  (DEPAKOTE ) 500 MG DR tablet Take 2 tablets (1,000 mg total) by mouth every 12 (twelve) hours. 11/12/23   Gonfa, Taye T, MD  imiquimod  (ALDARA ) 5 % cream Apply to affected area three times weekly for 8 weeks. Patient taking differently:  Apply 1 Application topically every Tuesday, Thursday, and Saturday at 6 PM. Apply to perianal area 01/13/24 01/12/25  Shitarev, Dimitry, MD  levETIRAcetam  (KEPPRA ) 500 MG tablet Take 1 tablet (500 mg total) by mouth 2 (two) times daily. 01/12/24   Alba Sharper, MD  losartan  (COZAAR ) 50 MG tablet Take 50 mg by mouth daily.    [provider]  metoprolol  succinate (TOPROL -XL) 25 MG 24 hr tablet Take 1 tablet (25 mg total) by mouth daily. 01/13/24   Shitarev, Dimitry, MD  Multiple Vitamins-Minerals (MULTIVITAMIN WITH MINERALS) tablet Take 1 tablet by mouth daily.    [provider]  nitroGLYCERIN  (NITROSTAT ) 0.4 MG SL tablet Place 0.4 mg under the tongue every 5 (five) minutes as needed for chest pain.    [provider]    Allergies: Cetirizine & related and Hibiclens  [chlorhexidine  gluconate]    Review of Systems  Updated Vital Signs BP 120/86   Pulse 71   Temp (!) 97.5 F (36.4 C) (Oral)   Resp 12   Ht 1.829 m (6')   Wt 113.8 kg   SpO2 97%   BMI 34.03 kg/m   Physical Exam Vitals and nursing note reviewed.  Constitutional:      General: He is not in acute distress.    Appearance: He is well-developed.  HENT:     Head: Normocephalic and atraumatic.  Eyes:     Conjunctiva/sclera: Conjunctivae normal.  Cardiovascular:     Rate and Rhythm:  Normal rate and regular rhythm.     Pulses: Normal pulses.  Pulmonary:     Effort: Pulmonary effort is normal. No respiratory distress.     Breath sounds: No stridor.  Abdominal:     General: There is no distension.  Musculoskeletal:     Comments: Right upper arm dialysis access with dialysis clamps in place.  Skin:    General: Skin is warm and dry.  Neurological:     Mental Status: He is oriented to person, place, and time.     (all labs ordered are listed, but only abnormal results are displayed) Labs Reviewed - No data to display   EKG: EKG Interpretation Date/Time:  Sunday May 10 2024 11:04:23  EST Ventricular Rate:  89 PR Interval:  180 QRS Duration:  112 QT Interval:  402 QTC Calculation: 490 R Axis:   22  Text Interpretation: Sinus rhythm Left atrial enlargement Incomplete right bundle Hillis block ST-t wave abnormality Confirmed by Garrick Charleston [phone]) on 05/10/2024 11:32:22 AM  Radiology: No results found.   Procedures   Medications Ordered in the ED  LORazepam  (ATIVAN ) injection 2 mg (has no administration in time range)  divalproex  (DEPAKOTE ) DR tablet 1,000 mg (1,000 mg Oral Given 05/10/24 1258)  levETIRAcetam  (KEPPRA ) tablet 500 mg (500 mg Oral Given 05/10/24 1258)                                    Medical Decision Making Adult male seizure disorder, end-stage renal disease presents after witnessed seizure like activity at his facility with hypotension. Differential includes seizure, versus hypoperfusion with hypotension associated with dialysis. Patient has no evidence for trauma, is currently hemodynamically unremarkable, though he has presenting in a postictal phase. Cardiac 85 sinus normal pulse ox 100% room air normal  Amount and/or Complexity of Data Reviewed Independent Historian: EMS External Data Reviewed: notes. Labs: ordered. Decision-making details documented in ED Course. Radiology: ordered and independent interpretation performed. Decision-making details documented in ED Course. ECG/medicine tests: ordered and independent interpretation performed. Decision-making details documented in ED Course.  Risk Prescription drug management. Decision regarding hospitalization. Diagnosis or treatment significantly limited by social determinants of health.   2:39 PM Patient awake, alert, oriented appropriately, vital signs unremarkable.  Dialysis access has been discontinued with the assistance of our nurse access team. Patient's return to baseline interactivity reassuring, suspicion for changes secondary to ongoing dialysis contributing to the  episode earlier in the day.  With no hemodynamic instability, no complaints, patient will be monitored, should he remain asymptomatic, will return to his nursing facility.    Final diagnoses:  Seizure [facility])    ED Discharge Orders     None          Garrick Charleston, MD 05/10/24 1439

## 2024-05-11 ENCOUNTER — Encounter (HOSPITAL_COMMUNITY): Payer: Self-pay

## 2024-05-13 ENCOUNTER — Other Ambulatory Visit: Payer: Self-pay

## 2024-05-13 ENCOUNTER — Encounter (HOSPITAL_COMMUNITY)
Admission: RE | Disposition: A | Discharge status: Home / Self Care | Payer: Self-pay | Source: Home / Self Care | Attending: Vascular Surgery

## 2024-05-13 ENCOUNTER — Ambulatory Visit (HOSPITAL_COMMUNITY)
Admission: RE | Admit: 2024-05-13 | Discharge: 2024-05-13 | Disposition: A | Discharge status: Home / Self Care | Attending: Vascular Surgery | Admitting: Vascular Surgery

## 2024-05-13 ENCOUNTER — Encounter (HOSPITAL_COMMUNITY): Payer: Self-pay | Admitting: Vascular Surgery

## 2024-05-13 DIAGNOSIS — Z992 Dependence on renal dialysis: Secondary | ICD-10-CM | POA: Insufficient documentation

## 2024-05-13 DIAGNOSIS — N186 End stage renal disease: Secondary | ICD-10-CM | POA: Insufficient documentation

## 2024-05-13 DIAGNOSIS — Y832 Surgical operation with anastomosis, bypass or graft as the cause of abnormal reaction of the patient, or of later complication, without mention of misadventure at the time of the procedure: Secondary | ICD-10-CM | POA: Insufficient documentation

## 2024-05-13 DIAGNOSIS — T82858A Stenosis of vascular prosthetic devices, implants and grafts, initial encounter: Secondary | ICD-10-CM | POA: Insufficient documentation

## 2024-05-13 DIAGNOSIS — F17290 Nicotine dependence, other tobacco product, uncomplicated: Secondary | ICD-10-CM | POA: Diagnosis not present

## 2024-05-13 DIAGNOSIS — I120 Hypertensive chronic kidney disease with stage 5 chronic kidney disease or end stage renal disease: Secondary | ICD-10-CM | POA: Diagnosis not present

## 2024-05-13 HISTORY — PX: VENOUS ANGIOPLASTY: CATH118376

## 2024-05-13 HISTORY — PX: A/V FISTULAGRAM: CATH118298

## 2024-05-13 SURGERY — A/V FISTULAGRAM
Anesthesia: LOCAL | Site: Arm Upper | Laterality: Right

## 2024-05-13 MED ORDER — IODIXANOL 320 MG/ML IV SOLN
INTRAVENOUS | Status: DC | PRN
  Administered 2024-05-13: 25 mL via INTRAVENOUS

## 2024-05-13 MED ORDER — LIDOCAINE HCL (PF) 1 % IJ SOLN
INTRAMUSCULAR | Status: AC
  Filled 2024-05-13: qty 30

## 2024-05-13 MED ORDER — LIDOCAINE HCL (PF) 1 % IJ SOLN
INTRAMUSCULAR | Status: DC | PRN
  Administered 2024-05-13: 2 mL via SUBCUTANEOUS

## 2024-05-13 MED ORDER — HEPARIN (PORCINE) IN NACL 1000-0.9 UT/500ML-% IV SOLN
INTRAVENOUS | Status: DC | PRN
  Administered 2024-05-13: 500 mL

## 2024-05-13 SURGICAL SUPPLY — 10 items
BALLOON ATHLETIS 8X30X75 (BALLOONS) IMPLANT
DEVICE INFLATION ENCORE 26 (MISCELLANEOUS) IMPLANT
KIT MICROPUNCTURE NIT STIFF (SHEATH) IMPLANT
MAT PREVALON FULL STRYKER (MISCELLANEOUS) IMPLANT
SHEATH PINNACLE R/O II 6F 4CM (SHEATH) IMPLANT
SHEATH PROBE COVER 6X72 (BAG) IMPLANT
STOPCOCK MORSE 400PSI 3WAY (MISCELLANEOUS) IMPLANT
TRAY PV CATH (CUSTOM PROCEDURE TRAY) ×2 IMPLANT
TUBING CIL FLEX 10 FLL-RA (TUBING) IMPLANT
WIRE BENTSON .035X145CM (WIRE) IMPLANT

## 2024-05-13 NOTE — Op Note (Signed)
    Patient name: [name] [name] MRN: [id number] DOB: [birth year] Sex: male  05/13/2024 Pre-operative Diagnosis: ESRD on HD Post-operative diagnosis:  Same Surgeon:  Norman GORMAN Serve, MD Procedure Performed:  Outpatient evaluation, level 3 Ultrasound-guided access of dialysis circuit, fistulogram and central venogram, peripheral balloon angioplasty.  [address]  Indications: Mr. Bartunek is a 43 year old male with ESRD on HD who presents to the HD access center for a poorly functioning right arm AV access with prolonged bleeding after decannulation.  We discussed the first-line treatment of fistulogram to better evaluate his flow and potentially intervene on outflow stenosis.  Risks and benefits were reviewed and he elected to proceed.  Findings:  Patent central venous system, right IJ TDC in place.  Severe focal 90% stenosis in the upper arm portion of the fistula, widely patent proximal aspect and anastomosis   Procedure:  The patient was identified in the holding area and taken to the cath lab  The patient was then placed supine on the table and prepped and draped in the usual sterile fashion.  A time out was called.  Ultrasound was used to evaluate the right arm AV access. This was accessed under u/s guidance. An 018 wire was advanced without resistance, a micropuncture sheath was placed and fistulagram obtained which demonstrated the above findings.  This access was then upsized to a 6 F short sheath.  The lesion was crossed with a Bentson wire and treated with an 8 mm x 30 mm Athletis balloon.  A completion picture demonstrated an excellent result with minimal residual stenosis.  The wire and sheath were removed and the access was managed with a 4 Monocryl figure-of-eight suture for hemostasis.  Contrast: 25 cc Sedation: None  Impression: Successful balloon angioplasty of a focal 90% stenosis in the upper arm aspect of the fistula. 8 mm x 30 mm Athletis   Norman GORMAN Serve MD [facility] [place] Office: [phone]

## 2024-05-13 NOTE — H&P (Signed)
 HD ACCESS CENTER H&P   Patient ID: [name] [name], male   DOB: 1981/02/04, 43 y.o.   MRN: [id number]  Subjective:     HPI [name] [name] is a 43 y.o. male with ESRD presenting for evaluation of HD access and consideration of intervention. Referred by: Dr Marlee Having issues with Prolonged bleeding after decannulation   Past Medical History:  Diagnosis Date   ESRD on hemodialysis (HCC)    Hypertension    Lupus    Seizures (HCC)    Seizures (HCC)    Stroke (HCC)    61 or 43 years of age.     Wears glasses    Family History  Problem Relation Age of Onset   Diabetes Mother    Diabetes Maternal Grandmother    Seizures Neg Hx        not on mother's side. he doesn't know about his father's side   Past Surgical History:  Procedure Laterality Date   AV FISTULA PLACEMENT Left 12/30/2017   Procedure: ARTERIOVENOUS (AV) FISTULA CREATION VERSUS INSERTION OF ARTERIOVENOUS GRAFT LEFT ARM;  Surgeon: Oris Krystal FALCON, MD;  Location: [facility] OR;  Service: Vascular;  Laterality: Left;   AV FISTULA PLACEMENT Right 12/24/2023   Procedure: BRACHIOBASCILIC ARTERIOVENOUS (AV) FISTULA CREATION;  Surgeon: Sheree Penne Bruckner, MD;  Location: [facility] OR;  Service: Vascular;  Laterality: Right;   BASCILIC VEIN TRANSPOSITION Left 08/15/2018   Procedure: LEFT FIRST STAGE BASCILIC VEIN TRANSPOSITION;  Surgeon: Oris Krystal FALCON, MD;  Location: [facility] OR;  Service: Vascular;  Laterality: Left;   BASCILIC VEIN TRANSPOSITION Left 10/30/2018   Procedure: INSERTION OF GORE-TEX GRAFT LEFT UPPER ARM;  Surgeon: Sheree Penne Bruckner, MD;  Location: [facility] OR;  Service: Vascular;  Laterality: Left;   BASCILIC VEIN TRANSPOSITION Right 02/25/2024   Procedure: TRANSPOSITION, VEIN, BASILIC;  Surgeon: Sheree Penne Bruckner, MD;  Location: [facility] OR;  Service: Vascular;  Laterality: Right;   INSERTION OF DIALYSIS CATHETER N/A 09/20/2018   Procedure: INSERTION OF TUNNELED DIALYSIS CATHETER RIGHT INTERNAL JUGULAR;  Surgeon: Gretta Bruckner PARAS, MD;  Location: [facility] OR;  Service: Vascular;  Laterality: N/A;   IR FLUORO GUIDE CV LINE RIGHT  07/16/2022   IR US  GUIDE VASC ACCESS RIGHT  07/16/2022   RENAL BIOPSY      Short Social History:  Social History   Tobacco Use   Smoking status: Former    Types: E-cigarettes   Smokeless tobacco: Never  Substance Use Topics   Alcohol use: Not Currently    Allergies  Allergen Reactions   Cetirizine & Related Swelling   Hibiclens  [Chlorhexidine  Gluconate] Itching    No current facility-administered medications for this encounter.    REVIEW OF SYSTEMS All other systems were reviewed and are negative     Objective:   Objective   Vitals:   05/13/24 0753 05/13/24 0801  BP: (!) 133/101 (!) 129/96  Pulse: 82 83  Resp: 12 16  Temp: 98 F (36.7 C)   TempSrc: Oral   SpO2: 98% 95%   There is no height or weight on file to calculate BMI.  Physical Exam General: no acute distress Cardiac: hemodynamically stable Extremities: Palpable thrill in right arm AVF      Assessment/Plan:   [name] [name] is a 43 y.o. male with ESRD and a poorly functioning right arm AV access. Having issues with prolonged bleeding after sessions. I explained that there is at-least a moderate risk of thrombosis and therefore offered intervention.  We discussed the first-line  option of fistulogram, we discussed the risks and benefits and he elected to proceed.   Norman Serve, MD [facility]

## 2024-06-02 ENCOUNTER — Encounter (HOSPITAL_COMMUNITY): Payer: Self-pay

## 2024-06-05 ENCOUNTER — Encounter (HOSPITAL_COMMUNITY): Payer: Self-pay

## 2024-06-05 ENCOUNTER — Emergency Department (HOSPITAL_COMMUNITY)

## 2024-06-05 ENCOUNTER — Other Ambulatory Visit: Payer: Self-pay

## 2024-06-05 ENCOUNTER — Emergency Department (HOSPITAL_COMMUNITY)
Admission: EM | Admit: 2024-06-05 | Discharge: 2024-06-05 | Disposition: A | Discharge status: Skilled Nursing Facility | Attending: Emergency Medicine | Admitting: Emergency Medicine

## 2024-06-05 DIAGNOSIS — Z7901 Long term (current) use of anticoagulants: Secondary | ICD-10-CM | POA: Diagnosis not present

## 2024-06-05 DIAGNOSIS — Z992 Dependence on renal dialysis: Secondary | ICD-10-CM | POA: Insufficient documentation

## 2024-06-05 DIAGNOSIS — R569 Unspecified convulsions: Secondary | ICD-10-CM | POA: Diagnosis present

## 2024-06-05 DIAGNOSIS — Z7982 Long term (current) use of aspirin: Secondary | ICD-10-CM | POA: Insufficient documentation

## 2024-06-05 LAB — CBC WITH DIFFERENTIAL/PLATELET
Abs Immature Granulocytes: 0.03 K/uL (ref 0.00–0.07)
Basophils Absolute: 0 K/uL (ref 0.0–0.1)
Basophils Relative: 0 %
Eosinophils Absolute: 0.1 K/uL (ref 0.0–0.5)
Eosinophils Relative: 2 %
HCT: 35.1 % — ABNORMAL LOW (ref 39.0–52.0)
Hemoglobin: 11.5 g/dL — ABNORMAL LOW (ref 13.0–17.0)
Immature Granulocytes: 0 %
Lymphocytes Relative: 14 %
Lymphs Abs: 1 K/uL (ref 0.7–4.0)
MCH: 34.7 pg — ABNORMAL HIGH (ref 26.0–34.0)
MCHC: 32.8 g/dL (ref 30.0–36.0)
MCV: 106 fL — ABNORMAL HIGH (ref 80.0–100.0)
Monocytes Absolute: 1 K/uL (ref 0.1–1.0)
Monocytes Relative: 13 %
Neutro Abs: 5.3 K/uL (ref 1.7–7.7)
Neutrophils Relative %: 71 %
Platelets: 67 K/uL — ABNORMAL LOW (ref 150–400)
RBC: 3.31 MIL/uL — ABNORMAL LOW (ref 4.22–5.81)
RDW: 15.4 % (ref 11.5–15.5)
WBC: 7.5 K/uL (ref 4.0–10.5)
nRBC: 0 % (ref 0.0–0.2)

## 2024-06-05 LAB — I-STAT CHEM 8, ED
BUN: 34 mg/dL — ABNORMAL HIGH (ref 6–20)
Calcium, Ion: 0.96 mmol/L — ABNORMAL LOW (ref 1.15–1.40)
Chloride: 96 mmol/L — ABNORMAL LOW (ref 98–111)
Creatinine, Ser: 8.8 mg/dL — ABNORMAL HIGH (ref 0.61–1.24)
Glucose, Bld: 76 mg/dL (ref 70–99)
HCT: 37 % — ABNORMAL LOW (ref 39.0–52.0)
Hemoglobin: 12.6 g/dL — ABNORMAL LOW (ref 13.0–17.0)
Potassium: 4.3 mmol/L (ref 3.5–5.1)
Sodium: 137 mmol/L (ref 135–145)
TCO2: 30 mmol/L (ref 22–32)

## 2024-06-05 LAB — BASIC METABOLIC PANEL WITH GFR
Anion gap: 12 (ref 5–15)
BUN: 33 mg/dL — ABNORMAL HIGH (ref 6–20)
CO2: 30 mmol/L (ref 22–32)
Calcium: 8.3 mg/dL — ABNORMAL LOW (ref 8.9–10.3)
Chloride: 95 mmol/L — ABNORMAL LOW (ref 98–111)
Creatinine, Ser: 9.46 mg/dL — ABNORMAL HIGH (ref 0.61–1.24)
GFR, Estimated: 6 mL/min — ABNORMAL LOW
Glucose, Bld: 73 mg/dL (ref 70–99)
Potassium: 4.4 mmol/L (ref 3.5–5.1)
Sodium: 137 mmol/L (ref 135–145)

## 2024-06-05 LAB — VALPROIC ACID LEVEL: Valproic Acid Lvl: 44 ug/mL — ABNORMAL LOW (ref 50–100)

## 2024-06-05 MED ORDER — LEVETIRACETAM (KEPPRA) 500 MG/5 ML ADULT IV PUSH
500.0000 mg | Freq: Once | INTRAVENOUS | Status: AC
  Administered 2024-06-05: 500 mg via INTRAVENOUS
  Filled 2024-06-05: qty 5

## 2024-06-05 MED ORDER — LEVETIRACETAM 500 MG PO TABS: 500.0000 mg | ORAL_TABLET | Freq: Two times a day (BID) | ORAL | 0 refills | Status: DC

## 2024-06-05 NOTE — ED Triage Notes (Signed)
 Pt bib ems from dialysis for witnessed seizure lasting about 1 min. Pt received 3.5 hours of dialysis when the seizure occurred. Pt was 80% on room air when ems arrived and lethargic. Pt placed on 4L [place] en route.  Upon arrival to ED pt alert to voice, able to answer questions, 94% on room air. Pt has no complaints

## 2024-06-05 NOTE — ED Notes (Signed)
 PTAR scheduled for the patient to return to [facility].  RN updated.

## 2024-06-05 NOTE — ED Provider Notes (Signed)
 " [place] EMERGENCY DEPARTMENT AT [facility] Provider Note   CSN: [id number] Arrival date & time: 06/05/24  1118     Patient presents with: No chief complaint on file.   [name] [name] is a 43 y.o. male.  {Add pertinent medical, surgical, social history, OB history to HPI:7352} 43 year old male with prior medical history as detailed below presents for evaluation.  Patient was at dialysis.  He completed 3-1/2 hours of his session.  He was then noticed to have a 1 minute long brief seizure.  On EMS arrival the patient was postictal.  His mental status has improved during transport.  On arrival to the ED he denies any complaint.  He apparently recalls the seizure.  He reports that he is compliant with his seizure meds.  Denies recent illness.  The history is provided by the patient and medical records.       Prior to Admission medications  Medication Sig Start Date End Date Taking? Authorizing Provider  acetaminophen  (TYLENOL ) 325 MG tablet Take 2 tablets (650 mg total) by mouth every 6 (six) hours as needed for mild pain. 02/13/23   Danton Reyes DASEN, MD  apixaban  (ELIQUIS ) 5 MG TABS tablet Take 1 tablet (5 mg total) by mouth 2 (two) times daily. 11/12/23   Kathrin Mignon DASEN, MD  aspirin  81 MG chewable tablet Chew 1 tablet (81 mg total) by mouth daily. 01/13/24   Shitarev, Dimitry, MD  atorvastatin  (LIPITOR) 40 MG tablet Take 1 tablet (40 mg total) by mouth daily. 08/03/23   Darci Pore, MD  AURYXIA  1 GM 210 MG(Fe) tablet Take 840 mg by mouth See admin instructions. Take 840 mg by mouth every evening on Monday, Wednesday, and Friday. Take 840 mg by mouth three times daily on Tuesday, Thursday, Saturday, and Sunday. 10/07/23   [provider]  cyclobenzaprine  (FLEXERIL ) 10 MG tablet Take 1 tablet (10 mg total) by mouth 2 (two) times daily as needed for muscle spasms. 03/01/24   Elnor Jayson LABOR, DO  divalproex  (DEPAKOTE ) 500 MG DR tablet Take 2 tablets (1,000 mg total)  by mouth every 12 (twelve) hours. 11/12/23   Gonfa, Taye T, MD  imiquimod  (ALDARA ) 5 % cream Apply to affected area three times weekly for 8 weeks. Patient taking differently: Apply 1 Application topically every Tuesday, Thursday, and Saturday at 6 PM. Apply to perianal area 01/13/24 01/12/25  Shitarev, Dimitry, MD  levETIRAcetam  (KEPPRA ) 500 MG tablet Take 1 tablet (500 mg total) by mouth 2 (two) times daily. 01/12/24   Quillen, Michael, MD  [Paused] losartan  (COZAAR ) 50 MG tablet Take 50 mg by mouth daily. Wait to take this until your doctor or other care provider tells you to start again.    [provider]  [Paused] metoprolol  succinate (TOPROL -XL) 25 MG 24 hr tablet Take 1 tablet (25 mg total) by mouth daily. Wait to take this until your doctor or other care provider tells you to start again. 01/13/24   Shitarev, Dimitry, MD  Multiple Vitamins-Minerals (MULTIVITAMIN WITH MINERALS) tablet Take 1 tablet by mouth daily.    [provider]  nitroGLYCERIN  (NITROSTAT ) 0.4 MG SL tablet Place 0.4 mg under the tongue every 5 (five) minutes as needed for chest pain.    [provider]    Allergies: Cetirizine & related and Hibiclens  [chlorhexidine  gluconate]    Review of Systems  All other systems reviewed and are negative.   Updated Vital Signs There were no vitals taken for this visit.  Physical Exam Vitals and nursing note reviewed.  Constitutional:      General: He is not in acute distress.    Appearance: He is well-developed.  HENT:     Head: Normocephalic and atraumatic.  Eyes:     Conjunctiva/sclera: Conjunctivae normal.  Cardiovascular:     Rate and Rhythm: Normal rate and regular rhythm.     Heart sounds: No murmur heard. Pulmonary:     Effort: Pulmonary effort is normal. No respiratory distress.     Breath sounds: Normal breath sounds.  Abdominal:     Palpations: Abdomen is soft.     Tenderness: There is no abdominal tenderness.  Musculoskeletal:         General: No swelling.     Cervical back: Neck supple.  Skin:    General: Skin is warm and dry.     Capillary Refill: Capillary refill takes less than 2 seconds.  Neurological:     Mental Status: He is alert.  Psychiatric:        Mood and Affect: Mood normal.     (all labs ordered are listed, but only abnormal results are displayed) Labs Reviewed  CBC WITH DIFFERENTIAL/PLATELET  BASIC METABOLIC PANEL WITH GFR  I-STAT CHEM 8, ED    EKG: None  Radiology: No results found.  {Document cardiac monitor, telemetry assessment procedure when appropriate:32947} Procedures   Medications Ordered in the ED - No data to display    {Click here for ABCD2, HEART and other calculators REFRESH Note before signing:1}                              Medical Decision Making Amount and/or Complexity of Data Reviewed Labs: ordered. Radiology: ordered.   ***  {Document critical care time when appropriate  Document review of labs and clinical decision tools ie CHADS2VASC2, etc  Document your independent review of radiology images and any outside records  Document your discussion with family members, caretakers and with consultants  Document social determinants of health affecting pt's care  Document your decision making why or why not admission, treatments were needed:32947:::1}   Final diagnoses:  None    ED Discharge Orders     None        "

## 2024-06-05 NOTE — ED Notes (Signed)
This RN attempted IV access without success, another RN to try

## 2024-06-05 NOTE — Discharge Instructions (Signed)
 Return for any problem.  ?

## 2024-06-05 NOTE — ED Notes (Signed)
 Report given to receiving RN.

## 2024-06-06 LAB — LEVETIRACETAM LEVEL: Levetiracetam Lvl: 11.6 ug/mL (ref 10.0–40.0)

## 2024-06-12 ENCOUNTER — Other Ambulatory Visit (HOSPITAL_COMMUNITY): Payer: Self-pay | Admitting: Nephrology

## 2024-06-12 DIAGNOSIS — N186 End stage renal disease: Secondary | ICD-10-CM

## 2024-06-23 ENCOUNTER — Ambulatory Visit (HOSPITAL_COMMUNITY)
Admission: RE | Admit: 2024-06-23 | Discharge: 2024-06-23 | Disposition: A | Discharge status: Home / Self Care | Source: Ambulatory Visit | Attending: Nephrology | Admitting: Nephrology

## 2024-06-23 DIAGNOSIS — N186 End stage renal disease: Secondary | ICD-10-CM | POA: Insufficient documentation

## 2024-06-23 DIAGNOSIS — Z4901 Encounter for fitting and adjustment of extracorporeal dialysis catheter: Secondary | ICD-10-CM | POA: Insufficient documentation

## 2024-06-23 HISTORY — PX: IR REMOVAL TUN CV CATH W/O FL: IMG2289

## 2024-06-23 MED ORDER — LIDOCAINE HCL 1 % IJ SOLN
INTRAMUSCULAR | Status: AC
  Filled 2024-06-23: qty 20

## 2024-06-23 MED ORDER — LIDOCAINE HCL (PF) 1 % IJ SOLN: 10.0000 mL | Freq: Once | INTRAMUSCULAR | Status: DC

## 2024-06-23 NOTE — Procedures (Signed)
 Successful removal of right IJ tunneled HD catheter.   After obtaining consent and performing a time-out, the right upper chest was prepped and draped in the normal sterile fashion. The heparin  was removed from both ports. 1% lidocaine  was used for local anesthesia. Using gentle blunt dissection the cuff of the catheter was exposed, a fibrin sheath was gently cut down with a scalpel, and the catheter was removed in its entirety. Pressure was held until hemostasis was obtained. A sterile dressing was applied. The patient tolerated the procedure well with no immediate complications.   Coco Sharpnack, AGACNP-BC 06/23/2024, 1:45 PM

## 2024-07-03 ENCOUNTER — Other Ambulatory Visit: Payer: Self-pay

## 2024-07-03 ENCOUNTER — Observation Stay (HOSPITAL_COMMUNITY)
Admission: EM | Admit: 2024-07-03 | Discharge: 2024-07-04 | Disposition: A | Discharge status: Skilled Nursing Facility | Attending: Internal Medicine | Admitting: Internal Medicine

## 2024-07-03 ENCOUNTER — Emergency Department (HOSPITAL_COMMUNITY)

## 2024-07-03 DIAGNOSIS — I10 Essential (primary) hypertension: Secondary | ICD-10-CM | POA: Diagnosis present

## 2024-07-03 DIAGNOSIS — D6861 Antiphospholipid syndrome: Secondary | ICD-10-CM | POA: Insufficient documentation

## 2024-07-03 DIAGNOSIS — I120 Hypertensive chronic kidney disease with stage 5 chronic kidney disease or end stage renal disease: Secondary | ICD-10-CM | POA: Diagnosis not present

## 2024-07-03 DIAGNOSIS — I69398 Other sequelae of cerebral infarction: Secondary | ICD-10-CM

## 2024-07-03 DIAGNOSIS — D696 Thrombocytopenia, unspecified: Secondary | ICD-10-CM | POA: Diagnosis not present

## 2024-07-03 DIAGNOSIS — N186 End stage renal disease: Secondary | ICD-10-CM | POA: Diagnosis not present

## 2024-07-03 DIAGNOSIS — Z7901 Long term (current) use of anticoagulants: Secondary | ICD-10-CM | POA: Diagnosis not present

## 2024-07-03 DIAGNOSIS — S21111A Laceration without foreign body of right front wall of thorax without penetration into thoracic cavity, initial encounter: Secondary | ICD-10-CM | POA: Diagnosis not present

## 2024-07-03 DIAGNOSIS — G40909 Epilepsy, unspecified, not intractable, without status epilepticus: Secondary | ICD-10-CM | POA: Diagnosis not present

## 2024-07-03 DIAGNOSIS — M3210 Systemic lupus erythematosus, organ or system involvement unspecified: Secondary | ICD-10-CM | POA: Insufficient documentation

## 2024-07-03 DIAGNOSIS — D62 Acute posthemorrhagic anemia: Secondary | ICD-10-CM | POA: Diagnosis not present

## 2024-07-03 DIAGNOSIS — X58XXXA Exposure to other specified factors, initial encounter: Secondary | ICD-10-CM | POA: Diagnosis not present

## 2024-07-03 DIAGNOSIS — T82838A Hemorrhage of vascular prosthetic devices, implants and grafts, initial encounter: Secondary | ICD-10-CM | POA: Diagnosis present

## 2024-07-03 DIAGNOSIS — Z992 Dependence on renal dialysis: Secondary | ICD-10-CM | POA: Insufficient documentation

## 2024-07-03 DIAGNOSIS — E875 Hyperkalemia: Secondary | ICD-10-CM

## 2024-07-03 DIAGNOSIS — Z79899 Other long term (current) drug therapy: Secondary | ICD-10-CM | POA: Insufficient documentation

## 2024-07-03 DIAGNOSIS — R569 Unspecified convulsions: Secondary | ICD-10-CM | POA: Diagnosis not present

## 2024-07-03 DIAGNOSIS — T148XXA Other injury of unspecified body region, initial encounter: Principal | ICD-10-CM

## 2024-07-03 LAB — HEMOGLOBIN AND HEMATOCRIT, BLOOD
HCT: 18 % — ABNORMAL LOW (ref 39.0–52.0)
HCT: 34.8 % — ABNORMAL LOW (ref 39.0–52.0)
Hemoglobin: 5.6 g/dL — CL (ref 13.0–17.0)
Hemoglobin: 12 g/dL — ABNORMAL LOW (ref 13.0–17.0)

## 2024-07-03 LAB — CBC WITH DIFFERENTIAL/PLATELET
Abs Immature Granulocytes: 0.02 K/uL (ref 0.00–0.07)
Basophils Absolute: 0 K/uL (ref 0.0–0.1)
Basophils Relative: 1 %
Eosinophils Absolute: 0.2 K/uL (ref 0.0–0.5)
Eosinophils Relative: 3 %
HCT: 34.9 % — ABNORMAL LOW (ref 39.0–52.0)
Hemoglobin: 10.7 g/dL — ABNORMAL LOW (ref 13.0–17.0)
Immature Granulocytes: 0 %
Lymphocytes Relative: 29 %
Lymphs Abs: 1.6 K/uL (ref 0.7–4.0)
MCH: 34.6 pg — ABNORMAL HIGH (ref 26.0–34.0)
MCHC: 30.7 g/dL (ref 30.0–36.0)
MCV: 112.9 fL — ABNORMAL HIGH (ref 80.0–100.0)
Monocytes Absolute: 0.8 K/uL (ref 0.1–1.0)
Monocytes Relative: 15 %
Neutro Abs: 2.8 K/uL (ref 1.7–7.7)
Neutrophils Relative %: 52 %
Platelets: 103 K/uL — ABNORMAL LOW (ref 150–400)
RBC: 3.09 MIL/uL — ABNORMAL LOW (ref 4.22–5.81)
RDW: 15.5 % (ref 11.5–15.5)
WBC: 5.5 K/uL (ref 4.0–10.5)
nRBC: 0 % (ref 0.0–0.2)

## 2024-07-03 LAB — BASIC METABOLIC PANEL WITH GFR
Anion gap: 15 (ref 5–15)
BUN: 62 mg/dL — ABNORMAL HIGH (ref 6–20)
CO2: 26 mmol/L (ref 22–32)
Calcium: 9.1 mg/dL (ref 8.9–10.3)
Chloride: 97 mmol/L — ABNORMAL LOW (ref 98–111)
Creatinine, Ser: 13.5 mg/dL — ABNORMAL HIGH (ref 0.61–1.24)
GFR, Estimated: 4 mL/min — ABNORMAL LOW
Glucose, Bld: 78 mg/dL (ref 70–99)
Potassium: 5.4 mmol/L — ABNORMAL HIGH (ref 3.5–5.1)
Sodium: 138 mmol/L (ref 135–145)

## 2024-07-03 LAB — HEPATITIS B SURFACE ANTIGEN: Hepatitis B Surface Ag: NONREACTIVE

## 2024-07-03 LAB — PROTIME-INR
INR: 1 (ref 0.8–1.2)
Prothrombin Time: 13.7 s (ref 11.4–15.2)

## 2024-07-03 LAB — PREPARE RBC (CROSSMATCH)

## 2024-07-03 MED ORDER — LEVETIRACETAM 500 MG PO TABS
500.0000 mg | ORAL_TABLET | Freq: Two times a day (BID) | ORAL | Status: DC
  Administered 2024-07-03 – 2024-07-04 (×3): 500 mg via ORAL
  Filled 2024-07-03 (×3): qty 1

## 2024-07-03 MED ORDER — LIDOCAINE-EPINEPHRINE (PF) 2 %-1:200000 IJ SOLN
INTRAMUSCULAR | Status: AC
  Filled 2024-07-03: qty 20

## 2024-07-03 MED ORDER — CYCLOBENZAPRINE HCL 10 MG PO TABS: 10.0000 mg | ORAL_TABLET | Freq: Two times a day (BID) | ORAL | Status: DC | PRN

## 2024-07-03 MED ORDER — ATORVASTATIN CALCIUM 40 MG PO TABS
40.0000 mg | ORAL_TABLET | Freq: Every day | ORAL | Status: DC
  Administered 2024-07-03 – 2024-07-04 (×2): 40 mg via ORAL
  Filled 2024-07-03 (×2): qty 1

## 2024-07-03 MED ORDER — SODIUM CHLORIDE 0.9% IV SOLUTION: Freq: Once | INTRAVENOUS | Status: DC

## 2024-07-03 MED ORDER — IOHEXOL 350 MG/ML SOLN
75.0000 mL | Freq: Once | INTRAVENOUS | Status: AC | PRN
  Administered 2024-07-03: 75 mL via INTRAVENOUS

## 2024-07-03 MED ORDER — FERRIC CITRATE 1 GM 210 MG(FE) PO TABS
840.0000 mg | ORAL_TABLET | Freq: Three times a day (TID) | ORAL | Status: DC
  Administered 2024-07-03 – 2024-07-04 (×3): 840 mg via ORAL
  Filled 2024-07-03 (×3): qty 4

## 2024-07-03 MED ORDER — SODIUM ZIRCONIUM CYCLOSILICATE 5 G PO PACK
5.0000 g | PACK | Freq: Once | ORAL | Status: AC
  Administered 2024-07-03: 5 g via ORAL
  Filled 2024-07-03: qty 1

## 2024-07-03 MED ORDER — DIVALPROEX SODIUM 500 MG PO DR TAB
1000.0000 mg | DELAYED_RELEASE_TABLET | Freq: Two times a day (BID) | ORAL | Status: DC
  Administered 2024-07-03 – 2024-07-04 (×3): 1000 mg via ORAL
  Filled 2024-07-03: qty 2
  Filled 2024-07-03: qty 4
  Filled 2024-07-03: qty 2

## 2024-07-03 NOTE — Progress Notes (Signed)
" °  IR BRIEF PROGRESS NOTE:  Patient presented to [facility] ED by EMS overnight with significant venous bleeding from right internal jugular tunneled HD cath removal site (removed by IR on 06/23/24). He is on Eliquis . Patient's hemoglobin in ED was 5.6 (baseline 12.6) and patient is currently receiving one unit PRBC. Bleeding was controlled after some direct pressure and placement of 2 figure-of-eight sutures. CT imaging of the chest demonstrates a hematoma without active extravasation.   On examination, pressure dressing was removed, and gauze dressing overlying tunneled HD cath insertion site,with minimal blood on dressing. Figure 8 stitches in place with no active bleeding noted from site. There is a non-tender and firm hematoma along the tract of the HD cath, approximately 2-4 cm from figure-of-eight sutures. No concern for pseudoaneurysm palpated at this time. Overall, patient appears tired, but well. Hemodynamically stable, VSS. Patient denies chest pain or SOB.   IR attending reviewed patient imaging, chart, and presentation. Agree with current plan to transfuse and re-evaluate. No further intervention from IR recommended at this time. Please re-consult IR with any further concerns. IR remains available.    Electronically Signed: Carlin DELENA Griffon, PA-C 07/03/2024, 9:18 AM      "

## 2024-07-03 NOTE — ED Notes (Signed)
 Dr. Bari at bedside applying pressure to wound with combat gauze.

## 2024-07-03 NOTE — Hospital Course (Addendum)
 Acute blood loss anemia Hemoglobin dropped from 10 --> 5.6 after significant bleeding from right IJ tunneled HD cath site following catheter removal on 06/23/2024 with IR.  Bleeding is controlled after pressure and a stitch was applied by ED physician.  CT of the neck and chest with contrast shows hematoma in the associated soft tissues without active extravasation and patent bilateral internal jugular veins.  ED physician initiated transfusion of 2 units packed red blood cells.  Patient remained hemodynamically stable without tachycardia.  IR was consulted who evaluated the patient and agree with current plan without further IR intervention. Posttransfusion H&H of 12.0 with stable AM CBC on day of discharge of 11.2. He was monitored with serial exams and telemetry. Catheter removal site without further bleeding. Dressing was changed prior to discharge. Recommend daily dressing changes. Remove sutures in 10-14 days from discharge.  - Please change dressing daily as needed.  - Please remove sutures in 10-15 days.  - Please recheck CBC in 3-5 days to ensure Hgb is stable; discharge Hgb of 11.2.    ESRD on HD MWF Hyperkalemia Receiving HD through right upper extremity AV fistula and last HD session 2 days ago without issue.  He is mildly hyperkalemic at 5.4 without EKG changes.  No other indications for urgent/emergent HD.  Discussed with nephrology who attempted to have HD on 1/16, but due to high census had to complete HD on Saturday 1/17. He was disturbed by not having HD at his outpatient center and stopped dialysis 80 minutes before treatment was scheduled to finish.  He did receive Lokelma  on 1/16.  - Continue Auryxia  3 times daily - Resume HD MWF schedule  - Recheck RFP    Antiphospholipid syndrome on Eliquis  Prior CVA with seizure disorder Previously followed by hematology but no recent visits.  Multiple prior ischemic CVAs with persistent seizure disorder as sequela.  He has been on warfarin in  the past with adherence issues and was then switched to Eliquis .  Previous plans have been to have him follow-up with hematology outpatient again but these have not been successful.  I do not see any missed hematology appointments in our system.  Seizure medicines include divalproex  which is prescribed 1000 mg twice daily but he reports taking this medication 3 times daily however he could not tell me if it was 500 or 1000 mg 3 times daily that he was taking.  Pharmacy fill history is slightly inconsistent but his last fill was a 30-day supply from 06/06/2024 and he does not report running out so if he is taking it 3 times a day he is likely taking 500 mg 3 times a day.  This was his previous dose until it was changed by inpatient neurology in May 2025 to 1000 mg twice daily.  He is also on Keppra  500 mg twice daily but again says that he takes this 3 times a day but possibly only sometimes like when he has seizures associated with blood pressure changes in dialysis.  Again pharmacy fill history is inconsistent but it does not appear that he is running out of this medication consistently early. He was continued on his home AED schedule. His anticoagulation was held during admission given Hgb drop. Though given x2 stable Hgb labs, he may resume anticoagulation on discharge.  - Divalproex  1000 mg twice daily and Keppra  500 mg twice daily - Continue eliquis     Hypertension He was initially hypertensive but normotensive after significant blood loss and initiation of blood transfusions.  Chart review shows that he has been previously been on losartan  and metoprolol  succinate but losartan  was either held or stopped and Toprol  succinate was changed to metoprolol  tartrate and the dose was effectively decreased from 25 mg of the long-acting daily to 25 mg of short acting daily.  Recommend BP monitoring outpatient and resume his home BP medications as clinically able.    Thrombocytopenia Chronic thrombocytopenia with  recent levels anywhere from 60-230 but primarily around 100-130.  CBC on arrival showed a platelet count of 103.  No signs of active bleeding following addressed HD cath site.  No mucosal bleeding noted on history or exam. He was monitored with CBC.  - repeat CBC outpatient

## 2024-07-03 NOTE — ED Notes (Signed)
 Patient transported to CT

## 2024-07-03 NOTE — ED Provider Notes (Signed)
" °  Physical Exam  BP (!) 122/95   Pulse 82   Temp 97.7 F (36.5 C) (Oral)   Resp 18   Ht 6' (1.829 m)   Wt 113 kg   SpO2 97%   BMI 33.79 kg/m   Physical Exam  Procedures  Procedures  ED Course / MDM   Clinical Course as of 07/03/24 0809  Fri Jul 03, 2024  0320 Spoke to Dr. Magda, vascular.  Recommended applying pressure more approximately in the IJ.  On reassessment, nursing is placing a compressive dressing.  Bleeding seems to have dissipated at this time.  Will monitor closely. [CH]  [id number] Nursing reported patient complaining of fullness in his neck and difficulty swallowing.  On repeat exam, ABCs are intact.  No external signs of expanding hematoma and dressing with minimal blood noted.  Will obtain imaging given concern for more internal bleed related to IJ trauma.  Will continue to monitor closely. [CH]  [id number] Patient clinically stable.  Resting comfortably.  Small amount of blood noted on dressing but no active bleeding noted.  CT reviewed.  Noted hematoma at IJ site but no active extra half.  Will monitor and repeat hemoglobin. [CH]    Clinical Course User Index [CH] Horton, Charmaine FALCON, MD   Medical Decision Making Amount and/or Complexity of Data Reviewed Labs: ordered. Radiology: ordered.  Risk Prescription drug management.   Assumed care of this patient at signout.  In brief 44 year old male developed some significant bleeding at site of previous IJ catheter.  Evaluated the patient, bleeding remained controlled.  There was no new bleeding on the patient's dressing.  Did have a pretty significant hemoglobin drop to 5.6.  I have began transfusing patient.  Hemodynamically stable.  Did reach out to interventional radiology via secure chat to make them aware of the patient so they could round on him when they are able.  Will admit patient to hospitalist.       Mannie Pac T, DO 07/03/24 0810  "

## 2024-07-03 NOTE — H&P (Signed)
 " Date: 07/03/2024               Patient Name:  [name] [name] MRN: [id number]  DOB: April 02, 1981 Age / Sex: 44 y.o., male   PCP: Theotis Haze ORN, NP         Medical Service: Internal Medicine Teaching Service         Attending Physician: Dr. Eben Reyes BROCKS, MD      First Contact: Dr. Doyal Miyamoto, MD    Second Contact: Dr. Lonni Africa, DO         After Hours (After 5p/  First Contact Pager: [phone]  weekends / holidays): Second Contact Pager: [phone]   SUBJECTIVE   Chief Complaint: bleeding from RIJ tunneled HD cath site after removal 1/6  History of Present Illness:  [name] [name] is a 44 year old male with a past medical history of ESRD on HD MWF, antiphospholipid syndrome on Eliquis , prior CVA with resultant seizures, and lupus who presents after significant bleeding from right internal jugular tunneled HD catheter site following removal on 06/23/2024.  Last night he woke up with a warm sensation and pain over his right chest.  In the ED they applied pressure and placed two figure-of-eight stitches with resolution of bleeding.  He is currently feeling a little cold but denies any significant dizziness, shortness of breath, severe fatigue or other associated symptoms.  He still has a small amount of pain around his right chest but denies any pain or swelling in his neck.  He denies any strenuous activity or possible provoking events in the last few days other than maybe occasionally scratching over the area on his upper chest.  He is on Eliquis  and his last dose was last night before going to bed.  ED Course: Presented as above hypertensive at 160/114, afebrile, not tachycardic, and satting well on room air.  Hemoglobin on arrival was 10.7 and after 4 hours dropped to 5.6 following control of his bleeding.  CT scan showed 2.9 x 2.2 x 3.8 cm hematoma in the subcutaneous fat of the right upper chest wall without active extravasation and patent bilateral internal jugular veins.   Transfusion of 2 units of packed red blood cells was started, IR was consulted, and IMTS was paged for admission.  Past Medical History ESRD on HD MWF Hypertension Prior CVA Seizures following CVA Lupus  Meds:  Eliquis  5 mg twice daily Atorvastatin  40 mg daily Cyclobenzaprine  10 mg twice daily as needed Divalproex  1000 mg twice daily, patient reports taking 3 times a day unclear if 500 or 1000 mg Levetiracetam  500 mg twice daily Auryxia  210 mg 3 times daily Midodrine  2.5 mg twice daily  Medications on hold or recently discontinued Losartan  50 mg daily Metoprolol  to tartrate 25 mg daily  Past Surgical History Past Surgical History:  Procedure Laterality Date   A/V FISTULAGRAM Right 05/13/2024   Procedure: A/V Fistulagram;  Surgeon: Pearline Norman RAMAN, MD;  Location: [facility];  Service: Cardiovascular;  Laterality: Right;   AV FISTULA PLACEMENT Left 12/30/2017   Procedure: ARTERIOVENOUS (AV) FISTULA CREATION VERSUS INSERTION OF ARTERIOVENOUS GRAFT LEFT ARM;  Surgeon: Oris Krystal FALCON, MD;  Location: [facility] OR;  Service: Vascular;  Laterality: Left;   AV FISTULA PLACEMENT Right 12/24/2023   Procedure: BRACHIOBASCILIC ARTERIOVENOUS (AV) FISTULA CREATION;  Surgeon: Sheree Penne Lonni, MD;  Location: [facility] OR;  Service: Vascular;  Laterality: Right;   BASCILIC VEIN TRANSPOSITION Left 08/15/2018   Procedure: LEFT FIRST STAGE BASCILIC VEIN TRANSPOSITION;  Surgeon:  Oris Krystal FALCON, MD;  Location: [facility] OR;  Service: Vascular;  Laterality: Left;   BASCILIC VEIN TRANSPOSITION Left 10/30/2018   Procedure: INSERTION OF GORE-TEX GRAFT LEFT UPPER ARM;  Surgeon: Sheree Penne Bruckner, MD;  Location: [facility] OR;  Service: Vascular;  Laterality: Left;   BASCILIC VEIN TRANSPOSITION Right 02/25/2024   Procedure: TRANSPOSITION, VEIN, BASILIC;  Surgeon: Sheree Penne Bruckner, MD;  Location: [facility] OR;  Service: Vascular;  Laterality: Right;   INSERTION OF DIALYSIS CATHETER N/A 09/20/2018   Procedure: INSERTION OF  TUNNELED DIALYSIS CATHETER RIGHT INTERNAL JUGULAR;  Surgeon: Gretta Bruckner PARAS, MD;  Location: [facility] OR;  Service: Vascular;  Laterality: N/A;   IR FLUORO GUIDE CV LINE RIGHT  07/16/2022   IR REMOVAL TUN CV CATH W/O FL  06/23/2024   IR US  GUIDE VASC ACCESS RIGHT  07/16/2022   RENAL BIOPSY     VENOUS ANGIOPLASTY Right 05/13/2024   Procedure: VENOUS ANGIOPLASTY;  Surgeon: Pearline Norman RAMAN, MD;  Location: [facility];  Service: Cardiovascular;  Laterality: Right;  Outflow Basilic Vein    Social:  Lives in a center or group home.  Is independent in ADLs and IDLs. PCP: Theotis Haze ORN, NP Substances: 10-12 pack-year history, stopped smoking last year.  Prior occasional alcohol use, none in the past several years.  Denies current or prior drug use.  Family History:  Family History  Problem Relation Age of Onset   Diabetes Mother    Diabetes Maternal Grandmother    Seizures Neg Hx        not on mother's side. he doesn't know about his father's side    Allergies: Allergies as of 07/03/2024 - Review Complete 07/03/2024  Allergen Reaction Noted   Cetirizine & related Swelling    Hibiclens  [chlorhexidine  gluconate] Itching 01/07/2024    Review of Systems: A complete ROS was negative except as per HPI.   OBJECTIVE:   Physical Exam: Blood pressure (!) 122/95, pulse 82, temperature 97.7 F (36.5 C), temperature source Oral, resp. rate 18, height 6' (1.829 m), weight 113 kg, SpO2 97%.  Constitutional: Slightly tired appearing adult male sitting up at the edge of bed. In no acute distress. HENT: Normocephalic, atraumatic,  Eyes: Sclera non-icteric, PERRL, EOM intact Neck:normal atraumatic, no neck masses Cardio:Regular rate and rhythm. 2+ bilateral radial pulses.  Palpable thrill over right upper extremity AV fistula. Pulm:Clear to auscultation bilaterally. Normal work of breathing on room air. Abdomen: Soft, non-tender, non-distended, positive bowel sounds. MSK: Trace pitting edema in the  lower extremities bilaterally. Skin:Warm and dry.  Right upper anterior chest tunneled HD cath site without any active bleeding Neuro:Alert and oriented x3. No focal deficit noted.  Labs: CBC    Component Value Date/Time   WBC 5.5 07/03/2024 0319   RBC 3.09 (L) 07/03/2024 0319   HGB 5.6 (LL) 07/03/2024 0700   HGB 13.0 09/27/2020 1140   HGB 15.5 10/14/2013 1434   HCT 18.0 (L) 07/03/2024 0700   HCT 38.5 09/27/2020 1140   HCT 45.6 10/14/2013 1434   PLT 103 (L) 07/03/2024 0319   PLT 69 (LL) 09/27/2020 1140   MCV 112.9 (H) 07/03/2024 0319   MCV 96 09/27/2020 1140   MCV 87.2 10/14/2013 1434   MCH 34.6 (H) 07/03/2024 0319   MCHC 30.7 07/03/2024 0319   RDW 15.5 07/03/2024 0319   RDW 14.6 09/27/2020 1140   RDW 13.0 10/14/2013 1434   LYMPHSABS 1.6 07/03/2024 0319   LYMPHSABS 1.2 09/27/2020 1140   LYMPHSABS 1.4 10/14/2013 1434  MONOABS 0.8 07/03/2024 0319   MONOABS 0.5 10/14/2013 1434   EOSABS 0.2 07/03/2024 0319   EOSABS 0.2 09/27/2020 1140   BASOSABS 0.0 07/03/2024 0319   BASOSABS 0.0 09/27/2020 1140   BASOSABS 0.0 10/14/2013 1434     CMP     Component Value Date/Time   NA 138 07/03/2024 0319   NA 138 09/27/2020 1140   K 5.4 (H) 07/03/2024 0319   CL 97 (L) 07/03/2024 0319   CO2 26 07/03/2024 0319   GLUCOSE 78 07/03/2024 0319   BUN 62 (H) 07/03/2024 0319   BUN 50 (H) 09/27/2020 1140   CREATININE 13.50 (H) 07/03/2024 0319   CREATININE 15.72 (H) 01/24/2021 0938   CALCIUM  9.1 07/03/2024 0319   CALCIUM  8.4 (L) 12/23/2017 0237   PROT 5.9 (L) 03/06/2024 1001   PROT 8.0 09/27/2020 1140   ALBUMIN  3.0 (L) 03/13/2024 1210   ALBUMIN  4.6 09/27/2020 1140   AST 17 03/06/2024 1001   ALT 6 03/06/2024 1001   ALKPHOS 52 03/06/2024 1001   BILITOT 0.5 03/06/2024 1001   BILITOT 0.4 09/27/2020 1140   GFRNONAA 4 (L) 07/03/2024 0319   GFRNONAA 40 (L) 10/24/2015 1603   GFRAA 4 (L) 01/14/2020 2020   GFRAA 47 (L) 10/24/2015 1603    Imaging: CT Chest W Contrast Result Date:  07/03/2024 IMPRESSION: 1. Hematoma at the prior tunneled right IJ approach dialysis catheter entry site with adjacent reactive edema or cellulitis; no subpectoral or mediastinal hematoma. 2. Significant improvement in prior mediastinal adenopathy.  Electronically signed by: Francis Quam MD 07/03/2024 05:59 AM EST RP Workstation: [id number]   CT Soft Tissue Neck W Contrast Result Date: 07/03/2024 IMPRESSION: 1. Hematoma in the subcutaneous fat of the right upper anterior chest wall overlying the clavicle, measuring approximately 2.9 x 2.2 x 3.8 cm. No evidence of active extravasation. Right internal jugular catheter has been removed; internal jugular veins are patent bilaterally. 2. Mucosal disease within the right maxillary sinus.  Electronically signed by: Evalene Coho MD 07/03/2024 05:57 AM EST RP Workstation: [id number]   DG Chest Portable 1 View Result Date: 07/03/2024 IMPRESSION: 1. No acute findings. 2.  Expiratory study. Limited view of the bases. 3. Stable cardiomegaly. 4. No intravascular catheters are seen.  Electronically signed by: Francis Quam MD 07/03/2024 03:12 AM EST RP Workstation: [id number]     EKG: personally reviewed my interpretation is sinus rhythm. Consistent with prior EKG.  ASSESSMENT & PLAN:   Assessment & Plan by Problem: Principal Problem:   Acute blood loss anemia Active Problems:   Chronic anticoagulation   Essential hypertension   ESRD (end stage renal disease) on dialysis (HCC)   Seizure, late effect of stroke (HCC)   Hyperkalemia   [name] [name] is a 44 y.o. male with pertinent PMH of ESRD on HD MWF, antiphospholipid syndrome on Eliquis , prior CVA with resultant seizures, and lupus who presented with bleeding from recently removed right IJ tunneled HD catheter site and is admitted for acute blood loss anemia.  Acute blood loss anemia Hemoglobin dropped from 10-5.6 after significant bleeding from right IJ tunneled HD cath site following catheter  removal on 06/23/2024 with IR.  Bleeding is controlled after pressure and a stitch was applied by ED physician.  CT of the neck and chest with contrast shows hematoma in the associated soft tissues without active extravasation and patent bilateral internal jugular veins.  ED physician initiated transfusion of 2 units packed red blood cells.  Patient remains hemodynamically stable without tachycardia.  IR was  consulted who evaluated the patient and agree with current plan without further IR intervention. - Transfused 2 units packed red blood cells - Posttransfusion H&H - Close monitoring with serial exams of tunneled HD site - Admit for monitoring with telemetry  ESRD on HD MWF Hyperkalemia Receiving HD through right upper extremity AV fistula and last HD session 2 days ago without issue.  He is mildly hyperkalemic at 5.4 without EKG changes.  No other indications for urgent/emergent HD.  Discussed with nephrology who will try to get HD done today and if not we will get it done tomorrow.  In the meantime we will give Lokelma . - Lokelma  5 mg once - HD per nephrology today or tomorrow - Continue Auryxia  3 times daily  Antiphospholipid syndrome on Eliquis  Prior CVA with seizure disorder Previously followed by hematology but no recent visits.  Multiple prior ischemic CVAs with persistent seizure disorder as sequela.  He has been on warfarin in the past with adherence issues and was then switched to Eliquis .  Previous plans have been to have him follow-up with hematology outpatient again but these have not been successful.  I do not see any missed hematology appointments in our system.  Seizure medicines include divalproex  which is prescribed 1000 mg twice daily but he reports taking this medication 3 times daily however he could not tell me if it was 500 or 1000 mg 3 times daily that he was taking.  Pharmacy fill history is slightly inconsistent but his last fill was a 30-day supply from 06/06/2024 and he  does not report running out so if he is taking it 3 times a day he is likely taking 500 mg 3 times a day.  This was his previous dose until it was changed by inpatient neurology in May 2025 to 1000 mg twice daily.  He is also on Keppra  500 mg twice daily but again says that he takes this 3 times a day but possibly only sometimes like when he has seizures associated with blood pressure changes in dialysis.  Again pharmacy fill history is inconsistent but it does not appear that he is running out of this medication consistently early. - Divalproex  1000 mg twice daily and Keppra  500 mg twice daily - Hold anticoagulation this morning and resume within the next 48 hours if no further bleeding and stable CBCs  Hypertension He was initially hypertensive but now normotensive after significant blood loss and initiation of blood transfusions.  Chart review shows that he has been previously been on losartan  and metoprolol  succinate but losartan  was either held or stopped and Toprol  succinate was changed to metoprolol  tartrate and the dose was effectively decreased from 25 mg of the long-acting daily to 25 mg of short acting daily.  We will monitor off antihypertensives and get input from nephrology before he discharges especially if he becomes hypertensive.  Thrombocytopenia Chronic thrombocytopenia with recent levels anywhere from 60-230 but primarily around 100-130.  CBC on arrival showed a platelet count of 103.  No signs of active bleeding following addressed HD cath site.  No mucosal bleeding noted on history or exam. - Monitor with CBC  Diet: Renal VTE: None Code: Full  Dispo: Admit patient to Observation with expected length of stay less than 2 midnights.  Signed: Fairy Pool, DO Internal Medicine Resident, PGY-3 Please contact the on call pager at [phone] for any urgent or emergent needs. 11:19 AM 07/03/2024 "

## 2024-07-03 NOTE — ED Triage Notes (Addendum)
 Pt BIB [facility] from [facility]. Pt C/O bleeding to R upper chest. Pt states he woke up from sleep and felt warmness and was bleeding. Bruising below the wound and swelling above. On arrival, EMS holding pressure on the wound. Alert to baseline. Pt states they take eliquis  at home.

## 2024-07-03 NOTE — Consult Note (Signed)
 [facility] Renal Consultation Note    Indication for Consultation:  Management of ESRD/hemodialysis; anemia, hypertension/volume and secondary hyperparathyroidism  ERE:Qozfpwh, Haze ORN, NP  HPI: [name] [name] is a 44 y.o. male with ESRD on HD MWF at [facility]. He has a past medical history significant for antiphospholipid syndrome, seizure disorder, SLE, prior CVA's, and obesity.  Patient was transported to the ED from [facility] d/t ongoing bleeding from R upper chest area. Also with noted swelling and bruising. It was noted his TDC was removed by IR on 06/23/24. Patient is on Eliquis . On arrival to the ED, he was found to have Hgb of 5.6 and 2 units PRBCs were ordered. IR was consulted. Pressure was applied to the area with 2 sutures placed. CT showed (+) hematoma in subcutaneous fate of R upper chest. Per IR, there is no concern for pseudoaneurysm. Seen and examined patient at bedside in the ED. He remains on RA and denies SOB, CP, palpitations, and N/V. CXR showed no acute process. K+ is 5.4 and Lokelma  X 1 was given. He has a functional R AVF. Given low Hgb, plan for dialysis tomorrow morning.  Past Medical History:  Diagnosis Date   ESRD on hemodialysis (HCC)    Hypertension    Lupus    Seizures (HCC)    Seizures (HCC)    Stroke (HCC)    46 or 44 years of age.     Wears glasses    Past Surgical History:  Procedure Laterality Date   A/V FISTULAGRAM Right 05/13/2024   Procedure: A/V Fistulagram;  Surgeon: Pearline Norman RAMAN, MD;  Location: [facility];  Service: Cardiovascular;  Laterality: Right;   AV FISTULA PLACEMENT Left 12/30/2017   Procedure: ARTERIOVENOUS (AV) FISTULA CREATION VERSUS INSERTION OF ARTERIOVENOUS GRAFT LEFT ARM;  Surgeon: Oris Krystal FALCON, MD;  Location: [facility];  Service: Vascular;  Laterality: Left;   AV FISTULA PLACEMENT Right 12/24/2023   Procedure: BRACHIOBASCILIC ARTERIOVENOUS (AV) FISTULA CREATION;  Surgeon: Sheree Penne Bruckner,  MD;  Location: [facility] OR;  Service: Vascular;  Laterality: Right;   BASCILIC VEIN TRANSPOSITION Left 08/15/2018   Procedure: LEFT FIRST STAGE BASCILIC VEIN TRANSPOSITION;  Surgeon: Oris Krystal FALCON, MD;  Location: [facility] OR;  Service: Vascular;  Laterality: Left;   BASCILIC VEIN TRANSPOSITION Left 10/30/2018   Procedure: INSERTION OF GORE-TEX GRAFT LEFT UPPER ARM;  Surgeon: Sheree Penne Bruckner, MD;  Location: [facility] OR;  Service: Vascular;  Laterality: Left;   BASCILIC VEIN TRANSPOSITION Right 02/25/2024   Procedure: TRANSPOSITION, VEIN, BASILIC;  Surgeon: Sheree Penne Bruckner, MD;  Location: [facility] OR;  Service: Vascular;  Laterality: Right;   INSERTION OF DIALYSIS CATHETER N/A 09/20/2018   Procedure: INSERTION OF TUNNELED DIALYSIS CATHETER RIGHT INTERNAL JUGULAR;  Surgeon: Gretta Bruckner PARAS, MD;  Location: [facility] OR;  Service: Vascular;  Laterality: N/A;   IR FLUORO GUIDE CV LINE RIGHT  07/16/2022   IR REMOVAL TUN CV CATH W/O FL  06/23/2024   IR US  GUIDE VASC ACCESS RIGHT  07/16/2022   RENAL BIOPSY     VENOUS ANGIOPLASTY Right 05/13/2024   Procedure: VENOUS ANGIOPLASTY;  Surgeon: Pearline Norman RAMAN, MD;  Location: [facility];  Service: Cardiovascular;  Laterality: Right;  Outflow Basilic Vein   Family History  Problem Relation Age of Onset   Diabetes Mother    Diabetes Maternal Grandmother    Seizures Neg Hx        not on mother's side. he doesn't know about his father's side  Social History:  reports that he has quit smoking. His smoking use included e-cigarettes. He has never used smokeless tobacco. He reports that he does not currently use alcohol. He reports that he does not use drugs. Allergies[1] Prior to Admission medications  Medication Sig Start Date End Date Taking? Authorizing Provider  acetaminophen  (TYLENOL ) 325 MG tablet Take 2 tablets (650 mg total) by mouth every 6 (six) hours as needed for mild pain. 02/13/23  Yes Danton Reyes DASEN, MD  apixaban  (ELIQUIS ) 5 MG TABS tablet Take 1 tablet (5  mg total) by mouth 2 (two) times daily. 11/12/23  Yes Kathrin Mignon DASEN, MD  aspirin  81 MG chewable tablet Chew 1 tablet (81 mg total) by mouth daily. 01/13/24  Yes Shitarev, Dimitry, MD  atorvastatin  (LIPITOR) 40 MG tablet Take 1 tablet (40 mg total) by mouth daily. Patient taking differently: Take 40 mg by mouth daily in the afternoon. 08/03/23  Yes Darci Pore, MD  AURYXIA  1 GM 210 MG(Fe) tablet Take 840 mg by mouth with breakfast, with lunch, and with evening meal. 10/07/23  Yes [provider]  cyclobenzaprine  (FLEXERIL ) 10 MG tablet Take 1 tablet (10 mg total) by mouth 2 (two) times daily as needed for muscle spasms. Patient taking differently: Take 10 mg by mouth in the morning and at bedtime. 03/01/24  Yes Elnor Savant A, DO  divalproex  (DEPAKOTE ) 500 MG DR tablet Take 2 tablets (1,000 mg total) by mouth every 12 (twelve) hours. 11/12/23  Yes Gonfa, Taye T, MD  ferrous sulfate 325 (65 FE) MG EC tablet Take 325 mg by mouth daily.   Yes [provider]  levETIRAcetam  (KEPPRA ) 500 MG tablet Take 1 tablet (500 mg total) by mouth 2 (two) times daily. 01/12/24  Yes Alba Sharper, MD  metoprolol  tartrate (LOPRESSOR ) 12.5 mg TABS tablet Take 12.5 mg by mouth 2 (two) times daily.   Yes [provider]  midodrine  (PROAMATINE ) 2.5 MG tablet Take 2.5 mg by mouth in the morning and at bedtime.   Yes [provider]  Multiple Vitamins-Minerals (MULTIVITAMIN WITH MINERALS) tablet Take 1 tablet by mouth daily.   Yes [provider]  nitroGLYCERIN  (NITROSTAT ) 0.4 MG SL tablet Place 0.4 mg under the tongue every 5 (five) minutes as needed for chest pain.   Yes [provider]  imiquimod  (ALDARA ) 5 % cream Apply to affected area three times weekly for 8 weeks. Patient not taking: Reported on 07/03/2024 01/13/24 01/12/25  Shitarev, Dimitry, MD  [Paused] losartan  (COZAAR ) 50 MG tablet Take 50 mg by mouth daily. Patient not taking: Reported on 07/03/2024 Wait  to take this until your doctor or other care provider tells you to start again.    [provider]  [Paused] metoprolol  succinate (TOPROL -XL) 25 MG 24 hr tablet Take 1 tablet (25 mg total) by mouth daily. Patient not taking: Reported on 07/03/2024 Wait to take this until your doctor or other care provider tells you to start again. 01/13/24   Shitarev, Dimitry, MD   Current Facility-Administered Medications  Medication Dose Route Frequency Provider Last Rate Last Admin   0.9 %  sodium chloride  infusion (Manually program via Guardrails IV Fluids)   Intravenous Once Mannie Pac T, DO       atorvastatin  (LIPITOR) tablet 40 mg  40 mg Oral Daily Jolaine Pac, DO   40 mg at 07/03/24 0957   cyclobenzaprine  (FLEXERIL ) tablet 10 mg  10 mg Oral BID PRN Jolaine Pac, DO       divalproex  (  DEPAKOTE ) DR tablet 1,000 mg  1,000 mg Oral Q12H Jolaine Pac, DO   1,000 mg at 07/03/24 9043   ferric citrate  (AURYXIA ) tablet 840 mg  840 mg Oral TID with meals Jolaine Pac, DO   840 mg at 07/03/24 1311   levETIRAcetam  (KEPPRA ) tablet 500 mg  500 mg Oral BID Jolaine Pac, DO   500 mg at 07/03/24 0957   Labs: Basic Metabolic Panel: Recent Labs  Lab 07/03/24 0319  NA 138  K 5.4*  CL 97*  CO2 26  GLUCOSE 78  BUN 62*  CREATININE 13.50*  CALCIUM  9.1   Liver Function Tests: No results for input(s): AST, ALT, ALKPHOS, BILITOT, PROT, ALBUMIN  in the last 168 hours. No results for input(s): LIPASE, AMYLASE in the last 168 hours. No results for input(s): AMMONIA in the last 168 hours. CBC: Recent Labs  Lab 07/03/24 0319 07/03/24 0700  WBC 5.5  --   NEUTROABS 2.8  --   HGB 10.7* 5.6*  HCT 34.9* 18.0*  MCV 112.9*  --   PLT 103*  --    Cardiac Enzymes: No results for input(s): CKTOTAL, CKMB, CKMBINDEX, TROPONINI in the last 168 hours. CBG: No results for input(s): GLUCAP in the last 168 hours. Iron  Studies: No results for input(s): IRON , TIBC,  TRANSFERRIN, FERRITIN in the last 72 hours. Studies/Results: CT Chest W Contrast Result Date: 07/03/2024 EXAM: CT CHEST WITH CONTRAST 07/03/2024 05:19:45 AM TECHNIQUE: CT of the chest was performed with the administration of 75 mL of iohexol  (OMNIPAQUE ) 350 MG/ML injection. Multiplanar reformatted images are provided for review. Automated exposure control, iterative reconstruction, and/or weight based adjustment of the mA/kV was utilized to reduce the radiation dose to as low as reasonably achievable. COMPARISON: Portable chest from today, Portable chest 06/05/2024 and 03/07/2024, CTA chest 01/07/2024, chest CT no contrast 11/08/2022. CLINICAL HISTORY: tunneled IJ removed and bleeding FINDINGS: MEDIASTINUM: Heart: There is mild cardiomegaly. Calcifications are present in the lower and medial mitral ring. Scattered calcific plaque of the LAD and right coronary artery. There is no pericardial effusion. Pulmonary Trunk: The pulmonary trunk is prominent but unchanged at 3.3 cm. No central embolus is seen. Aorta/Great Vessels: There is mild atherosclerosis in the aorta and great vessels without an aneurysm, dissection, or stenosis. Pulmonary Veins: The pulmonary veins are nondilated. Central Airways: The central airways are clear. LYMPH NODES: Significant improvement is seen in prior mediastinal adenopathy. Examples include an AP window lymph node measuring 6 mm short axis on axial 46 of series 3, previously was 1.5 cm, and a right paratracheal lymph node measuring 1.2 cm was previously 2.4 cm. Further scattered lymph nodes up to 1 cm in short axis in the hilar and subcarinal regions are also significantly reduced in size. Axillary spaces are clear. LUNGS AND PLEURA: Lungs: There is mild bronchial thickening in the lower lobes. There is a low inspiration with chronic appearance of mosaic attenuation in the lower lobes, which could be due to air trapping or low inspiration. No focal pneumonia is seen. No pulmonary  nodule or mass. There is a calcified granuloma in the right lower lobe. No suspicious nodules. Pleura: No pleural effusion or pneumothorax. SOFT TISSUES/BONES: Soft Tissues: Previously there was a tunneled right IJ approach dialysis catheter in place which has been removed. Subcutaneously at the prior catheter entry there is a heterogeneously dense subcutaneous collection measuring 4.4 x 2.9 x 2.5 cm, which is consistent with a hematoma. There is adjacent stranding in the subcutaneous soft tissues consistent with reactive  edema or cellulitis. No subpectoral hematoma or mediastinal hematoma is seen. Bones: The osseous structures are dense consistent with renal osteodystrophy, with no acute or other significant osseous findings. There is moderate arthrosis at the shoulders. UPPER ABDOMEN: Kidneys: The kidneys are chronically atrophic. Spleen: There is scarring in the spleen. Other: No acute upper abdominal findings. IMPRESSION: 1. Hematoma at the prior tunneled right IJ approach dialysis catheter entry site with adjacent reactive edema or cellulitis; no subpectoral or mediastinal hematoma. 2. Significant improvement in prior mediastinal adenopathy. Electronically signed by: Francis Quam MD 07/03/2024 05:59 AM EST RP Workstation: [id number]   CT Soft Tissue Neck W Contrast Result Date: 07/03/2024 EXAM: CT NECK WITH CONTRAST 07/03/2024 05:19:45 AM TECHNIQUE: CT of the neck was performed with the administration of 75 mL of iohexol  (OMNIPAQUE ) 350 MG/ML injection. Multiplanar reformatted images are provided for review. Automated exposure control, iterative reconstruction, and/or weight based adjustment of the mA/kV was utilized to reduce the radiation dose to as low as reasonably achievable. COMPARISON: None available. CLINICAL HISTORY: tunneled IJ bleeding FINDINGS: AERODIGESTIVE TRACT: No discrete mass. No edema. SALIVARY GLANDS: The parotid and submandibular glands are unremarkable. THYROID: Unremarkable. LYMPH  NODES: No suspicious cervical lymphadenopathy. SOFT TISSUES: There is an ovoid soft tissue density in the subcutaneous fat of the right upper anterior chest wall overlying the clavicle, measuring approximately 2.9 x 2.2 x 3.8 cm, compatible with a hematoma. The internal jugular catheter described in the clinical indication has been removed. The internal jugular veins are patent bilaterally. There is no evidence of active extravasation. BONES: No abnormality. OTHER: Visualized sinuses and mastoid air cells are well aerated. There is mucosal disease within the right maxillary sinus. Visualized lungs are clear. IMPRESSION: 1. Hematoma in the subcutaneous fat of the right upper anterior chest wall overlying the clavicle, measuring approximately 2.9 x 2.2 x 3.8 cm. No evidence of active extravasation. Right internal jugular catheter has been removed; internal jugular veins are patent bilaterally. 2. Mucosal disease within the right maxillary sinus. Electronically signed by: Evalene Coho MD 07/03/2024 05:57 AM EST RP Workstation: [id number]   DG Chest Portable 1 View Result Date: 07/03/2024 EXAM: 1 VIEW(S) XRAY OF THE CHEST 07/03/2024 03:00:00 AM COMPARISON: Portable chest 06/05/2024. CLINICAL HISTORY: catheter placement catheter placement FINDINGS: LINES, TUBES AND DEVICES: Multiple overlying telemetry leads. No intravascular catheters are identified. LUNGS AND PLEURA: Lungs are expiratory. The bases are obscured by expiration. The aerated lungs appear clear. No focal pulmonary opacity. No pleural effusion. No pneumothorax. HEART AND MEDIASTINUM: There is stable cardiomegaly. Mild central vascular prominence without appreciable edema. Mediastinum is normally outlined. BONES AND SOFT TISSUES: Mild thoracic spondylosis. No acute osseous abnormality. There is an artifact overlying the outer right rib cage . IMPRESSION: 1. No acute findings. 2.  Expiratory study. Limited view of the bases. 3. Stable cardiomegaly. 4.  No intravascular catheters are seen. Electronically signed by: Francis Quam MD 07/03/2024 03:12 AM EST RP Workstation: [id number]    ROS: All others negative except those listed in HPI.  Physical Exam: Vitals:   07/03/24 1121 07/03/24 1139 07/03/24 1330 07/03/24 1414  BP: 121/84 134/86 (!) 144/101 (!) 139/95  Pulse: 81 78 (!) 57 76  Resp: 20 12 12 18   Temp: 97.6 F (36.4 C) (!) 97.4 F (36.3 C)  98 F (36.7 C)  TempSrc: Oral Oral    SpO2: 95%  90% 96%  Weight:      Height:         General: WDWN NAD Head: NCAT  sclera not icteric  Lungs: CTA bilaterally. No wheeze, rales or rhonchi. Breathing is unlabored. Heart: RRR. No murmur, rubs or gallops.  Abdomen: soft and non-tender Lower extremities: No LE edema  Neuro: AAOx3. Moves all extremities spontaneously. Dialysis Access: R AVF  Dialysis Orders:  MWF - [facility] 4hrs, BFR 450, DFR 800,  EDW 111.7kg, 2K/ 2Ca Heparin  None Mircera 50 mcg q2wks - last 1/14 Calcitriol  1.48mcg PO qHD - last 1/14 Sensipar  150mg  - last 1/14  Last Labs: Hgb 5.6 (2 units PRBCs ordered), K 5.4, Ca 9.1  Assessment/Plan: Acute blood loss anemia - Ongoing bleeding from R upper chest. S/p TDC removal 1/6. Hgb 5.6 and 2 unit PRBCs ordered. IR consulted. CT showed no concern for pseudoaneurysm. ESRD -  on HD MWF. Given low Hgb, next HD tomorrow morning Hypertension/volume  - Bps and volume are stable Anemia of CKD - See above Secondary Hyperparathyroidism -  Checking phos in AM  Charmaine Piety, NP [facility] 07/03/2024, 3:00 PM      [1]  Allergies Allergen Reactions   Cetirizine & Related Swelling   Hibiclens  [Chlorhexidine  Gluconate] Itching

## 2024-07-03 NOTE — ED Provider Notes (Signed)
 " [place] EMERGENCY DEPARTMENT AT [facility] Provider Note   CSN: [id number] Arrival date & time: 07/03/24  0245     Patient presents with: Bleeding/Bruising   [name] [name] is a 44 y.o. male.   HPI      This is a 44 year old male with a history of end-stage renal disease who presents with bleeding from his right chest.  Patient presents with acute bleeding to the right chest.  Per EMS they held direct pressure but continued to have bleeding through the site.  Based on chart review, it appears that he had a tunneled IJ catheter removed on 1/6 by interventional radiology.  He does report that he had dialysis yesterday through his established fistula.  He had had the same dressing on since 1/6 and tonight he woke up with blood everywhere.  He is on Eliquis .  Level 5 caveat  Prior to Admission medications  Medication Sig Start Date End Date Taking? Authorizing Provider  acetaminophen  (TYLENOL ) 325 MG tablet Take 2 tablets (650 mg total) by mouth every 6 (six) hours as needed for mild pain. 02/13/23   Danton Reyes DASEN, MD  apixaban  (ELIQUIS ) 5 MG TABS tablet Take 1 tablet (5 mg total) by mouth 2 (two) times daily. 11/12/23   Kathrin Mignon DASEN, MD  aspirin  81 MG chewable tablet Chew 1 tablet (81 mg total) by mouth daily. 01/13/24   Shitarev, Dimitry, MD  atorvastatin  (LIPITOR) 40 MG tablet Take 1 tablet (40 mg total) by mouth daily. 08/03/23   Darci Pore, MD  AURYXIA  1 GM 210 MG(Fe) tablet Take 840 mg by mouth See admin instructions. Take 840 mg by mouth every evening on Monday, Wednesday, and Friday. Take 840 mg by mouth three times daily on Tuesday, Thursday, Saturday, and Sunday. 10/07/23   [provider]  cyclobenzaprine  (FLEXERIL ) 10 MG tablet Take 1 tablet (10 mg total) by mouth 2 (two) times daily as needed for muscle spasms. 03/01/24   Elnor Jayson LABOR, DO  divalproex  (DEPAKOTE ) 500 MG DR tablet Take 2 tablets (1,000 mg total) by mouth every 12 (twelve)  hours. 11/12/23   Gonfa, Taye T, MD  imiquimod  (ALDARA ) 5 % cream Apply to affected area three times weekly for 8 weeks. Patient taking differently: Apply 1 Application topically every Tuesday, Thursday, and Saturday at 6 PM. Apply to perianal area 01/13/24 01/12/25  Shitarev, Dimitry, MD  levETIRAcetam  (KEPPRA ) 500 MG tablet Take 1 tablet (500 mg total) by mouth 2 (two) times daily. 01/12/24   Alba Sharper, MD  levETIRAcetam  (KEPPRA ) 500 MG tablet Take 1 tablet (500 mg total) by mouth 2 (two) times daily. 06/05/24   Laurice Maude BROCKS, MD  [Paused] losartan  (COZAAR ) 50 MG tablet Take 50 mg by mouth daily. Wait to take this until your doctor or other care provider tells you to start again.    [provider]  [Paused] metoprolol  succinate (TOPROL -XL) 25 MG 24 hr tablet Take 1 tablet (25 mg total) by mouth daily. Wait to take this until your doctor or other care provider tells you to start again. 01/13/24   Shitarev, Dimitry, MD  Multiple Vitamins-Minerals (MULTIVITAMIN WITH MINERALS) tablet Take 1 tablet by mouth daily.    [provider]  nitroGLYCERIN  (NITROSTAT ) 0.4 MG SL tablet Place 0.4 mg under the tongue every 5 (five) minutes as needed for chest pain.    [provider]    Allergies: Cetirizine & related and Hibiclens  [chlorhexidine  gluconate]    Review of Systems  Constitutional:  Negative for fever.  Respiratory:  Negative for shortness of breath.   Cardiovascular:  Negative for chest pain.  Skin:        Bleeding wound  All other systems reviewed and are negative.   Updated Vital Signs BP (!) 136/98   Pulse 86   Resp 11   Ht 1.829 m (6')   Wt 113 kg   SpO2 93%   BMI 33.79 kg/m   Physical Exam Vitals and nursing note reviewed.  Constitutional:      Appearance: He is well-developed. He is obese.  HENT:     Head: Normocephalic and atraumatic.  Eyes:     Pupils: Pupils are equal, round, and reactive to light.  Cardiovascular:     Rate and  Rhythm: Normal rate and regular rhythm.  Pulmonary:     Effort: Pulmonary effort is normal. No respiratory distress.     Breath sounds: Normal breath sounds. No wheezing.  Chest:       Comments: Brisk bleeding from tunneled catheter site on right chest, granulomatous tissue present, bleeding slows with direct pressure but is continuous when pressure is released Bruising into right breast area as well as a hematoma noted superior to the clavicle on the right Abdominal:     General: Bowel sounds are normal.     Palpations: Abdomen is soft.     Tenderness: There is no abdominal tenderness. There is no rebound.  Musculoskeletal:     Cervical back: Neck supple.  Lymphadenopathy:     Cervical: No cervical adenopathy.  Skin:    General: Skin is warm and dry.  Neurological:     Mental Status: He is alert and oriented to person, place, and time.     (all labs ordered are listed, but only abnormal results are displayed) Labs Reviewed  CBC WITH DIFFERENTIAL/PLATELET - Abnormal; Notable for the following components:      Result Value   RBC 3.09 (*)    Hemoglobin 10.7 (*)    HCT 34.9 (*)    MCV 112.9 (*)    MCH 34.6 (*)    Platelets 103 (*)    All other components within normal limits  BASIC METABOLIC PANEL WITH GFR - Abnormal; Notable for the following components:   Potassium 5.4 (*)    Chloride 97 (*)    BUN 62 (*)    Creatinine, Ser 13.50 (*)    GFR, Estimated 4 (*)    All other components within normal limits  PROTIME-INR  HEMOGLOBIN AND HEMATOCRIT, BLOOD  TYPE AND SCREEN    EKG: EKG Interpretation Date/Time:  Friday July 03 2024 02:52:23 EST Ventricular Rate:  82 PR Interval:  184 QRS Duration:  101 QT Interval:  370 QTC Calculation: 433 R Axis:   -23  Text Interpretation: Sinus rhythm Probable left atrial enlargement Borderline left axis deviation RSR' in V1 or V2, probably normal variant Confirmed by Bari Pfeiffer ([id number]) on 07/03/2024 3:33:18  AM  Radiology: CT Chest W Contrast Result Date: 07/03/2024 EXAM: CT CHEST WITH CONTRAST 07/03/2024 05:19:45 AM TECHNIQUE: CT of the chest was performed with the administration of 75 mL of iohexol  (OMNIPAQUE ) 350 MG/ML injection. Multiplanar reformatted images are provided for review. Automated exposure control, iterative reconstruction, and/or weight based adjustment of the mA/kV was utilized to reduce the radiation dose to as low as reasonably achievable. COMPARISON: Portable chest from today, Portable chest 06/05/2024 and 03/07/2024, CTA chest 01/07/2024, chest CT no contrast 11/08/2022. CLINICAL HISTORY: tunneled IJ removed and  bleeding FINDINGS: MEDIASTINUM: Heart: There is mild cardiomegaly. Calcifications are present in the lower and medial mitral ring. Scattered calcific plaque of the LAD and right coronary artery. There is no pericardial effusion. Pulmonary Trunk: The pulmonary trunk is prominent but unchanged at 3.3 cm. No central embolus is seen. Aorta/Great Vessels: There is mild atherosclerosis in the aorta and great vessels without an aneurysm, dissection, or stenosis. Pulmonary Veins: The pulmonary veins are nondilated. Central Airways: The central airways are clear. LYMPH NODES: Significant improvement is seen in prior mediastinal adenopathy. Examples include an AP window lymph node measuring 6 mm short axis on axial 46 of series 3, previously was 1.5 cm, and a right paratracheal lymph node measuring 1.2 cm was previously 2.4 cm. Further scattered lymph nodes up to 1 cm in short axis in the hilar and subcarinal regions are also significantly reduced in size. Axillary spaces are clear. LUNGS AND PLEURA: Lungs: There is mild bronchial thickening in the lower lobes. There is a low inspiration with chronic appearance of mosaic attenuation in the lower lobes, which could be due to air trapping or low inspiration. No focal pneumonia is seen. No pulmonary nodule or mass. There is a calcified granuloma in  the right lower lobe. No suspicious nodules. Pleura: No pleural effusion or pneumothorax. SOFT TISSUES/BONES: Soft Tissues: Previously there was a tunneled right IJ approach dialysis catheter in place which has been removed. Subcutaneously at the prior catheter entry there is a heterogeneously dense subcutaneous collection measuring 4.4 x 2.9 x 2.5 cm, which is consistent with a hematoma. There is adjacent stranding in the subcutaneous soft tissues consistent with reactive edema or cellulitis. No subpectoral hematoma or mediastinal hematoma is seen. Bones: The osseous structures are dense consistent with renal osteodystrophy, with no acute or other significant osseous findings. There is moderate arthrosis at the shoulders. UPPER ABDOMEN: Kidneys: The kidneys are chronically atrophic. Spleen: There is scarring in the spleen. Other: No acute upper abdominal findings. IMPRESSION: 1. Hematoma at the prior tunneled right IJ approach dialysis catheter entry site with adjacent reactive edema or cellulitis; no subpectoral or mediastinal hematoma. 2. Significant improvement in prior mediastinal adenopathy. Electronically signed by: Francis Quam MD 07/03/2024 05:59 AM EST RP Workstation: [id number]   CT Soft Tissue Neck W Contrast Result Date: 07/03/2024 EXAM: CT NECK WITH CONTRAST 07/03/2024 05:19:45 AM TECHNIQUE: CT of the neck was performed with the administration of 75 mL of iohexol  (OMNIPAQUE ) 350 MG/ML injection. Multiplanar reformatted images are provided for review. Automated exposure control, iterative reconstruction, and/or weight based adjustment of the mA/kV was utilized to reduce the radiation dose to as low as reasonably achievable. COMPARISON: None available. CLINICAL HISTORY: tunneled IJ bleeding FINDINGS: AERODIGESTIVE TRACT: No discrete mass. No edema. SALIVARY GLANDS: The parotid and submandibular glands are unremarkable. THYROID: Unremarkable. LYMPH NODES: No suspicious cervical lymphadenopathy. SOFT  TISSUES: There is an ovoid soft tissue density in the subcutaneous fat of the right upper anterior chest wall overlying the clavicle, measuring approximately 2.9 x 2.2 x 3.8 cm, compatible with a hematoma. The internal jugular catheter described in the clinical indication has been removed. The internal jugular veins are patent bilaterally. There is no evidence of active extravasation. BONES: No abnormality. OTHER: Visualized sinuses and mastoid air cells are well aerated. There is mucosal disease within the right maxillary sinus. Visualized lungs are clear. IMPRESSION: 1. Hematoma in the subcutaneous fat of the right upper anterior chest wall overlying the clavicle, measuring approximately 2.9 x 2.2 x 3.8 cm. No evidence  of active extravasation. Right internal jugular catheter has been removed; internal jugular veins are patent bilaterally. 2. Mucosal disease within the right maxillary sinus. Electronically signed by: Evalene Coho MD 07/03/2024 05:57 AM EST RP Workstation: [id number]   DG Chest Portable 1 View Result Date: 07/03/2024 EXAM: 1 VIEW(S) XRAY OF THE CHEST 07/03/2024 03:00:00 AM COMPARISON: Portable chest 06/05/2024. CLINICAL HISTORY: catheter placement catheter placement FINDINGS: LINES, TUBES AND DEVICES: Multiple overlying telemetry leads. No intravascular catheters are identified. LUNGS AND PLEURA: Lungs are expiratory. The bases are obscured by expiration. The aerated lungs appear clear. No focal pulmonary opacity. No pleural effusion. No pneumothorax. HEART AND MEDIASTINUM: There is stable cardiomegaly. Mild central vascular prominence without appreciable edema. Mediastinum is normally outlined. BONES AND SOFT TISSUES: Mild thoracic spondylosis. No acute osseous abnormality. There is an artifact overlying the outer right rib cage . IMPRESSION: 1. No acute findings. 2.  Expiratory study. Limited view of the bases. 3. Stable cardiomegaly. 4. No intravascular catheters are seen. Electronically  signed by: Francis Quam MD 07/03/2024 03:12 AM EST RP Workstation: [id number]     .Laceration Repair  Date/Time: 07/03/2024 3:59 AM  Performed by: Bari Charmaine FALCON, MD Authorized by: Bari Charmaine FALCON, MD   Consent:    Consent obtained:  Emergent situation   Consent given by:  Patient   Risks discussed:  Infection and pain Anesthesia:    Anesthesia method:  Local infiltration   Local anesthetic:  Lidocaine  2% WITH epi Laceration details:    Location:  Trunk   Trunk location:  R chest   Length (cm):  1 Exploration:    Limited defect created (wound extended): no     Hemostasis achieved with:  Epinephrine  and direct pressure   Contaminated: no   Treatment:    Area cleansed with:  Povidone-iodine    Debridement:  None Skin repair:    Repair method:  Sutures   Suture size:  3-0   Wound skin closure material used: Vicryl.   Suture technique:  Figure eight   Number of sutures:  2 Repair type:    Repair type:  Intermediate Post-procedure details:    Dressing:  Bulky dressing   Procedure completion:  Tolerated Comments:     2 figure-of-eight stitches placed externally for pressure control, bleeding temporized with hematoma noted under the skin, bulky dressing placed .Critical Care  Performed by: Bari Charmaine FALCON, MD Authorized by: Bari Charmaine FALCON, MD   Critical care provider statement:    Critical care time (minutes):  31   Critical care was necessary to treat or prevent imminent or life-threatening deterioration of the following conditions: Acute blood loss, multiple rechecks and bleeding control.   Critical care was time spent personally by me on the following activities:  Development of treatment plan with patient or surrogate, discussions with consultants, evaluation of patient's response to treatment, examination of patient, ordering and review of laboratory studies, ordering and review of radiographic studies, ordering and performing treatments and interventions,  pulse oximetry, re-evaluation of patient's condition and review of old charts    Medications Ordered in the ED  lidocaine -EPINEPHrine  (XYLOCAINE  W/EPI) 2 %-1:200000 (PF) injection (  Given 07/03/24 0300)  iohexol  (OMNIPAQUE ) 350 MG/ML injection 75 mL (75 mLs Intravenous Contrast Given 07/03/24 0510)    Clinical Course as of 07/03/24 0616  Fri Jul 03, 2024  0320 Spoke to Dr. Magda, vascular.  Recommended applying pressure more approximately in the IJ.  On reassessment, nursing is placing a compressive dressing.  Bleeding seems to  have dissipated at this time.  Will monitor closely. [CH]  [phone] Nursing reported patient complaining of fullness in his neck and difficulty swallowing.  On repeat exam, ABCs are intact.  No external signs of expanding hematoma and dressing with minimal blood noted.  Will obtain imaging given concern for more internal bleed related to IJ trauma.  Will continue to monitor closely. [CH]  [id number] Patient clinically stable.  Resting comfortably.  Small amount of blood noted on dressing but no active bleeding noted.  CT reviewed.  Noted hematoma at IJ site but no active extra half.  Will monitor and repeat hemoglobin. [CH]    Clinical Course User Index [CH] Erlinda Solinger, Charmaine FALCON, MD                                 Medical Decision Making Amount and/or Complexity of Data Reviewed Labs: ordered. Radiology: ordered.  Risk Prescription drug management.   This patient presents to the ED for concern of bleeding, this involves an extensive number of treatment options, and is a complaint that carries with it a high risk of complications and morbidity.  I considered the following differential and admission for this acute, potentially life threatening condition.  The differential diagnosis includes bleed, arterial bleed, hematoma, injury to IJ  MDM:    This is a 44 year old male with history of end-stage renal disease who presents with concern for bleeding.  Brisk bleeding noted  from right IJ tunnel catheter site.  IJ catheter was removed on 1/6.  Combat gauze and direct pressure were used without success.  I placed 2 figure-of-eight stitches externally which seem to temporize external bleeding.  Requested pressure dressing be applied.  Patient was noted to have bruising into the right pectoral space and a hematoma just above the clavicle.  At one point patient was complaining of worsening neck fullness.  For this reason, CT imaging was obtained to rule out active extravasation or IJ injury.  CT reassuring.  Initial hemoglobin 10.7.  Will repeat at 4 hours to ensure patient does not require any resuscitation.  (Labs, imaging, consults)  Labs: I Ordered, and personally interpreted labs.  The pertinent results include: CBC, BMP, PT/INR, type and screen, repeat hemoglobin  Imaging Studies ordered: I ordered imaging studies including CT chest, CT neck I independently visualized and interpreted imaging. I agree with the radiologist interpretation  Additional history obtained from chart review.  External records from outside source obtained and reviewed including interventional radiology and vascular notes  Cardiac Monitoring: The patient was maintained on a cardiac monitor.  If on the cardiac monitor, I personally viewed and interpreted the cardiac monitored which showed an underlying rhythm of: Sinus  Reevaluation: After the interventions noted above, I reevaluated the patient and found that they have :improved  Social Determinants of Health:  lives in facility  Disposition: Awaiting repeat hemoglobin  Co morbidities that complicate the patient evaluation  Past Medical History:  Diagnosis Date   ESRD on hemodialysis (HCC)    Hypertension    Lupus    Seizures (HCC)    Seizures (HCC)    Stroke (HCC)    47 or 44 years of age.     Wears glasses      Medicines Meds ordered this encounter  Medications   lidocaine -EPINEPHrine  (XYLOCAINE  W/EPI) 2 %-1:200000  (PF) injection    Young, Tiffany M: cabinet override   iohexol  (OMNIPAQUE ) 350 MG/ML injection 75 mL  I have reviewed the patients home medicines and have made adjustments as needed  Problem List / ED Course: Problem List Items Addressed This Visit   None Visit Diagnoses       Bleeding from wound    -  Primary     Hematoma                    Final diagnoses:  Bleeding from wound  Hematoma    ED Discharge Orders     None          Bari Charmaine FALCON, MD 07/03/24 [phone]  "

## 2024-07-03 NOTE — ED Notes (Signed)
 Pt notified this RN that he was having difficulty breathing, difficulty turning head to the right and only finds leaning forward most comfortable. Pressure dressing removed, no new signs of swelling around the wound. Dr. Bari made aware.

## 2024-07-03 NOTE — ED Notes (Signed)
 Pt refused linen change and bed bath.

## 2024-07-03 NOTE — ED Notes (Signed)
 Wound care performed on pts R upper chest. Verbal order from Dr. Bari to apply ice pack and pressure dressing.

## 2024-07-04 ENCOUNTER — Encounter (HOSPITAL_COMMUNITY): Payer: Self-pay | Admitting: Infectious Diseases

## 2024-07-04 DIAGNOSIS — D6861 Antiphospholipid syndrome: Secondary | ICD-10-CM | POA: Diagnosis not present

## 2024-07-04 DIAGNOSIS — N186 End stage renal disease: Secondary | ICD-10-CM

## 2024-07-04 DIAGNOSIS — I120 Hypertensive chronic kidney disease with stage 5 chronic kidney disease or end stage renal disease: Secondary | ICD-10-CM

## 2024-07-04 DIAGNOSIS — D696 Thrombocytopenia, unspecified: Secondary | ICD-10-CM

## 2024-07-04 DIAGNOSIS — I639 Cerebral infarction, unspecified: Secondary | ICD-10-CM

## 2024-07-04 DIAGNOSIS — E785 Hyperlipidemia, unspecified: Secondary | ICD-10-CM | POA: Diagnosis not present

## 2024-07-04 DIAGNOSIS — G40909 Epilepsy, unspecified, not intractable, without status epilepticus: Secondary | ICD-10-CM | POA: Diagnosis not present

## 2024-07-04 DIAGNOSIS — D62 Acute posthemorrhagic anemia: Principal | ICD-10-CM

## 2024-07-04 DIAGNOSIS — Z992 Dependence on renal dialysis: Secondary | ICD-10-CM

## 2024-07-04 LAB — BPAM RBC
Blood Product Expiration Date: 202602032359
Blood Product Expiration Date: 202602092359
ISSUE DATE / TIME: 202601160843
ISSUE DATE / TIME: 202601161116
Unit Type and Rh: 5100
Unit Type and Rh: 5100

## 2024-07-04 LAB — CBC
HCT: 32.3 % — ABNORMAL LOW (ref 39.0–52.0)
Hemoglobin: 11.2 g/dL — ABNORMAL LOW (ref 13.0–17.0)
MCH: 34.9 pg — ABNORMAL HIGH (ref 26.0–34.0)
MCHC: 34.7 g/dL (ref 30.0–36.0)
MCV: 100.6 fL — ABNORMAL HIGH (ref 80.0–100.0)
Platelets: 85 K/uL — ABNORMAL LOW (ref 150–400)
RBC: 3.21 MIL/uL — ABNORMAL LOW (ref 4.22–5.81)
RDW: 18.7 % — ABNORMAL HIGH (ref 11.5–15.5)
WBC: 5.9 K/uL (ref 4.0–10.5)
nRBC: 0 % (ref 0.0–0.2)

## 2024-07-04 LAB — TYPE AND SCREEN
ABO/RH(D): O POS
Antibody Screen: NEGATIVE
Unit division: 0
Unit division: 0

## 2024-07-04 LAB — BASIC METABOLIC PANEL WITH GFR
Anion gap: 16 — ABNORMAL HIGH (ref 5–15)
BUN: 79 mg/dL — ABNORMAL HIGH (ref 6–20)
CO2: 26 mmol/L (ref 22–32)
Calcium: 8.8 mg/dL — ABNORMAL LOW (ref 8.9–10.3)
Chloride: 97 mmol/L — ABNORMAL LOW (ref 98–111)
Creatinine, Ser: 16.2 mg/dL — ABNORMAL HIGH (ref 0.61–1.24)
GFR, Estimated: 3 mL/min — ABNORMAL LOW
Glucose, Bld: 75 mg/dL (ref 70–99)
Potassium: 5.9 mmol/L — ABNORMAL HIGH (ref 3.5–5.1)
Sodium: 139 mmol/L (ref 135–145)

## 2024-07-04 NOTE — TOC Transition Note (Signed)
 Transition of Care [facility]) - Discharge Note   Patient Details  Name: [name] [name] MRN: [id number] Date of Birth: 1980/08/02  [facility] CM/SW Contact:  Gwenn Julien [place], [place] Phone Number: 07/04/2024, 12:44 PM   Clinical Narrative: Pt for dc back to [facility] where he is a LTC resident. Spoke to [place] in admissions who confirmed they are prepared to admit. Pt aware of dc and reports agreeable. RN provided with number for report and PTAR arranged for transport. SW signing off at dc.   Julien Gwenn, MSW, LCSW [phone] (coverage)        Final next level of care: Skilled Nursing Facility Barriers to Discharge: Barriers Resolved   Patient Goals and CMS Choice            Discharge Placement              Patient chooses bed at: Other - please specify in the comment section below: [facility]) Patient to be transferred to facility by: PT[facility] Name of family member notified: Pt to update family Patient and family notified of of transfer: 07/04/24  Discharge Plan and Services Additional resources added to the After Visit Summary for                                       Social Drivers of Health (SDOH) Interventions SDOH Screenings   Food Insecurity: No Food Insecurity (07/03/2024)  Housing: Low Risk (07/03/2024)  Transportation Needs: No Transportation Needs (07/03/2024)  Utilities: Not At Risk (07/03/2024)  Alcohol Screen: Low Risk (03/26/2023)  Depression (PHQ2-9): Low Risk (03/26/2023)  Financial Resource Strain: Low Risk (03/26/2023)  Physical Activity: Inactive (03/26/2023)  Social Connections: Unknown (07/03/2024)  Stress: No Stress Concern Present (03/26/2023)  Tobacco Use: Medium Risk (06/05/2024)  Health Literacy: Adequate Health Literacy (03/26/2023)     Readmission Risk Interventions    01/08/2024   12:19 PM 03/28/2023    2:57 PM 01/25/2023    3:40 PM  Readmission Risk Prevention Plan  Post Dischage Appt      Medication Screening     Transportation Screening Complete Complete   Medication Review (RN Care Manager) Complete Referral to Pharmacy   PCP or Specialist appointment within 3-5 days of discharge Complete Complete   HRI or Home Care Consult Complete Complete   SW Recovery Care/Counseling Consult Complete Complete   Palliative Care Screening Not Applicable Not Applicable   Skilled Nursing Facility Complete Not Applicable      Information is confidential and restricted. Go to Review Flowsheets to unlock data.

## 2024-07-04 NOTE — Progress Notes (Signed)
 Pt, states, He wanted to come off Hemodialysis machine, D/T, states,  He want to go to his Hemodialysis Unit. Patient had 1 hour and 20 min left in his Hemodialysis treatment. MD Dennise is aware of results.

## 2024-07-04 NOTE — Procedures (Signed)
 I was present at this dialysis session. I have reviewed the session itself and made appropriate changes.   Filed Weights   07/03/24 0259 07/04/24 0834  Weight: 113 kg 117.5 kg    Recent Labs  Lab 07/04/24 0546  NA 139  K 5.9*  CL 97*  CO2 26  GLUCOSE 75  BUN 79*  CREATININE 16.20*  CALCIUM  8.8*    Recent Labs  Lab 07/03/24 0319 07/03/24 0700 07/03/24 1934 07/04/24 0546  WBC 5.5  --   --  5.9  NEUTROABS 2.8  --   --   --   HGB 10.7* 5.6* 12.0* 11.2*  HCT 34.9* 18.0* 34.8* 32.3*  MCV 112.9*  --   --  100.6*  PLT 103*  --   --  85*    Scheduled Meds:  sodium chloride    Intravenous Once   atorvastatin   40 mg Oral Daily   divalproex   1,000 mg Oral Q12H   ferric citrate   840 mg Oral TID with meals   levETIRAcetam   500 mg Oral BID   Continuous Infusions: PRN Meds:.cyclobenzaprine    Ephriam Stank, MD [facility] 07/04/2024, 9:18 AM

## 2024-07-04 NOTE — Plan of Care (Signed)

## 2024-07-04 NOTE — Progress Notes (Signed)
" °   07/04/24 1129  Vitals  Temp (!) 97.5 F (36.4 C)  Pulse Rate 87  Resp 15  BP (!) 139/92  SpO2 95 %  O2 Device Room Air  Weight 117 kg  Type of Weight Post-Dialysis   Received patient in bed to unit.  Alert and oriented.  Informed consent signed and in chart.   TX duration: Patient Had 1 hour and 20 mins left during Tx, Pt sign AMA  Patient tolerated well.  Transported back to the room  Alert, without acute distress.  Hand-off given to patient's nurse.   Access used: Yes Access issues: No   Total UF removed: 500 Medication(s) given: No Meds Given Post HD VS: See Above Grid Post HD weight: 117 kg   Zebedee DELENA [facility] "

## 2024-07-04 NOTE — Progress Notes (Signed)
 " [facility] Progress Note    Assessment/ Plan:   Acute blood loss anemia - Ongoing bleeding from R upper chest. S/p TDC removal 1/6. Hgb 5.6 and 2 unit PRBCs ordered. Hgb up to 11.2. stitch placed by IR, bleeding resolved ESRD -  on HD MWF sched Hypertension/volume  - Bps and volume are stable Anemia of CKD - See above Secondary Hyperparathyroidism -  check phos Thrombocytopenia: mgmt per primary service  OP Dialysis Orders:  MWF - [facility] 4hrs, BFR 450, DFR 800,  EDW 111.7kg, 2K/ 2Ca Heparin  None Mircera 50 mcg q2wks - last 1/14 Calcitriol  1.25mcg PO qHD - last 1/14 Sensipar  150mg  - last 1/14      Subjective:   Patient seen on dialysis, no complaints, tolerating treatment. Hoping to go home today Denies any more bleeding   Objective:   BP (!) 135/100   Pulse 73   Temp (!) 97.5 F (36.4 C)   Resp 17   Ht 6' (1.829 m)   Wt 117.5 kg   SpO2 94%   BMI 35.13 kg/m   Intake/Output Summary (Last 24 hours) at 07/04/2024 0919 Last data filed at 07/04/2024 0604 Gross per 24 hour  Intake 350 ml  Output 0 ml  Net 350 ml   Weight change:   Physical Exam: Gen: NAD, laying flat CVS: RRR Resp: Unlabored, normal WOB Abd: Soft, NT/ND Ext: Trace edema bilateral lower extremities Neuro: Awake alert Dialysis access: RUE AVF  Imaging: CT Chest W Contrast Result Date: 07/03/2024 EXAM: CT CHEST WITH CONTRAST 07/03/2024 05:19:45 AM TECHNIQUE: CT of the chest was performed with the administration of 75 mL of iohexol  (OMNIPAQUE ) 350 MG/ML injection. Multiplanar reformatted images are provided for review. Automated exposure control, iterative reconstruction, and/or weight based adjustment of the mA/kV was utilized to reduce the radiation dose to as low as reasonably achievable. COMPARISON: Portable chest from today, Portable chest 06/05/2024 and 03/07/2024, CTA chest 01/07/2024, chest CT no contrast 11/08/2022. CLINICAL HISTORY: tunneled IJ removed and  bleeding FINDINGS: MEDIASTINUM: Heart: There is mild cardiomegaly. Calcifications are present in the lower and medial mitral ring. Scattered calcific plaque of the LAD and right coronary artery. There is no pericardial effusion. Pulmonary Trunk: The pulmonary trunk is prominent but unchanged at 3.3 cm. No central embolus is seen. Aorta/Great Vessels: There is mild atherosclerosis in the aorta and great vessels without an aneurysm, dissection, or stenosis. Pulmonary Veins: The pulmonary veins are nondilated. Central Airways: The central airways are clear. LYMPH NODES: Significant improvement is seen in prior mediastinal adenopathy. Examples include an AP window lymph node measuring 6 mm short axis on axial 46 of series 3, previously was 1.5 cm, and a right paratracheal lymph node measuring 1.2 cm was previously 2.4 cm. Further scattered lymph nodes up to 1 cm in short axis in the hilar and subcarinal regions are also significantly reduced in size. Axillary spaces are clear. LUNGS AND PLEURA: Lungs: There is mild bronchial thickening in the lower lobes. There is a low inspiration with chronic appearance of mosaic attenuation in the lower lobes, which could be due to air trapping or low inspiration. No focal pneumonia is seen. No pulmonary nodule or mass. There is a calcified granuloma in the right lower lobe. No suspicious nodules. Pleura: No pleural effusion or pneumothorax. SOFT TISSUES/BONES: Soft Tissues: Previously there was a tunneled right IJ approach dialysis catheter in place which has been removed. Subcutaneously at the prior catheter entry there is a heterogeneously dense subcutaneous collection  measuring 4.4 x 2.9 x 2.5 cm, which is consistent with a hematoma. There is adjacent stranding in the subcutaneous soft tissues consistent with reactive edema or cellulitis. No subpectoral hematoma or mediastinal hematoma is seen. Bones: The osseous structures are dense consistent with renal osteodystrophy, with no  acute or other significant osseous findings. There is moderate arthrosis at the shoulders. UPPER ABDOMEN: Kidneys: The kidneys are chronically atrophic. Spleen: There is scarring in the spleen. Other: No acute upper abdominal findings. IMPRESSION: 1. Hematoma at the prior tunneled right IJ approach dialysis catheter entry site with adjacent reactive edema or cellulitis; no subpectoral or mediastinal hematoma. 2. Significant improvement in prior mediastinal adenopathy. Electronically signed by: Francis Quam MD 07/03/2024 05:59 AM EST RP Workstation: [id number]   CT Soft Tissue Neck W Contrast Result Date: 07/03/2024 EXAM: CT NECK WITH CONTRAST 07/03/2024 05:19:45 AM TECHNIQUE: CT of the neck was performed with the administration of 75 mL of iohexol  (OMNIPAQUE ) 350 MG/ML injection. Multiplanar reformatted images are provided for review. Automated exposure control, iterative reconstruction, and/or weight based adjustment of the mA/kV was utilized to reduce the radiation dose to as low as reasonably achievable. COMPARISON: None available. CLINICAL HISTORY: tunneled IJ bleeding FINDINGS: AERODIGESTIVE TRACT: No discrete mass. No edema. SALIVARY GLANDS: The parotid and submandibular glands are unremarkable. THYROID: Unremarkable. LYMPH NODES: No suspicious cervical lymphadenopathy. SOFT TISSUES: There is an ovoid soft tissue density in the subcutaneous fat of the right upper anterior chest wall overlying the clavicle, measuring approximately 2.9 x 2.2 x 3.8 cm, compatible with a hematoma. The internal jugular catheter described in the clinical indication has been removed. The internal jugular veins are patent bilaterally. There is no evidence of active extravasation. BONES: No abnormality. OTHER: Visualized sinuses and mastoid air cells are well aerated. There is mucosal disease within the right maxillary sinus. Visualized lungs are clear. IMPRESSION: 1. Hematoma in the subcutaneous fat of the right upper anterior  chest wall overlying the clavicle, measuring approximately 2.9 x 2.2 x 3.8 cm. No evidence of active extravasation. Right internal jugular catheter has been removed; internal jugular veins are patent bilaterally. 2. Mucosal disease within the right maxillary sinus. Electronically signed by: Evalene Coho MD 07/03/2024 05:57 AM EST RP Workstation: [id number]   DG Chest Portable 1 View Result Date: 07/03/2024 EXAM: 1 VIEW(S) XRAY OF THE CHEST 07/03/2024 03:00:00 AM COMPARISON: Portable chest 06/05/2024. CLINICAL HISTORY: catheter placement catheter placement FINDINGS: LINES, TUBES AND DEVICES: Multiple overlying telemetry leads. No intravascular catheters are identified. LUNGS AND PLEURA: Lungs are expiratory. The bases are obscured by expiration. The aerated lungs appear clear. No focal pulmonary opacity. No pleural effusion. No pneumothorax. HEART AND MEDIASTINUM: There is stable cardiomegaly. Mild central vascular prominence without appreciable edema. Mediastinum is normally outlined. BONES AND SOFT TISSUES: Mild thoracic spondylosis. No acute osseous abnormality. There is an artifact overlying the outer right rib cage . IMPRESSION: 1. No acute findings. 2.  Expiratory study. Limited view of the bases. 3. Stable cardiomegaly. 4. No intravascular catheters are seen. Electronically signed by: Francis Quam MD 07/03/2024 03:12 AM EST RP Workstation: [id number]    Labs: BMET Recent Labs  Lab 07/03/24 0319 07/04/24 0546  NA 138 139  K 5.4* 5.9*  CL 97* 97*  CO2 26 26  GLUCOSE 78 75  BUN 62* 79*  CREATININE 13.50* 16.20*  CALCIUM  9.1 8.8*   CBC Recent Labs  Lab 07/03/24 0319 07/03/24 0700 07/03/24 1934 07/04/24 0546  WBC 5.5  --   --  5.9  NEUTROABS 2.8  --   --   --   HGB 10.7* 5.6* 12.0* 11.2*  HCT 34.9* 18.0* 34.8* 32.3*  MCV 112.9*  --   --  100.6*  PLT 103*  --   --  85*    Medications:     sodium chloride    Intravenous Once   atorvastatin   40 mg Oral Daily   divalproex    1,000 mg Oral Q12H   ferric citrate   840 mg Oral TID with meals   levETIRAcetam   500 mg Oral BID      Ephriam Stank, MD [facility] 07/04/2024, 9:19 AM   "

## 2024-07-04 NOTE — Discharge Instructions (Addendum)
 To [name] or their caretakers,  They were admitted to [facility] on 07/03/2024 for evaluation and treatment of: bleeding from catheter removal site      The evaluation suggested continued bleeding after dialysis catheter removal. Your blood level was found to be low, but after you received a blood transfusion, your They were treated with sutures, dressing changes, blood transfusion, and close monitoring. Your bleeding was   They were discharged from the hospital on 07/04/24. I recommend the following after leaving the hospital:   Your medications were not adjusted during this admission. Please continue to take them as prescribed.   Please make sure you follow up at your outpatient dialysis center for your regularly scheduled dialysis sessions. We would like for them to take a look at your recent catheter removal site, change the dressing as needed at sessions.  Your sutures will need to be removed within 10-14 days from discharge.    Follow-up Information     Theotis Haze ORN, NP Follow up.   Specialty: Nurse Practitioner Why: In 10-14 days for suture removal if unable to be removed at [facility] Contact information: [address] [place] [address] [place] [address] [phone]                   For questions about your care plan, until you are able to see your primary doctor: Call [phone]. Dial  0 for the operator. Ask for the internal medicine resident on call.  Thank you for allowing us  to be part of your care.   Doyal Miyamoto, MD 07/04/2024, 12:57 PM

## 2024-07-04 NOTE — Discharge Summary (Signed)
 "  Name: [name] [name] MRN: [id number] DOB: [birth year] 44 y.o. PCP: Theotis Haze ORN, NP  Date of Admission: 07/03/2024  2:45 AM Date of Discharge: 07/04/2024 Attending Physician: Dr. Mliss Pouch  Discharge Diagnosis: 1. Principal Problem:   Acute blood loss anemia Active Problems:   Chronic anticoagulation   Essential hypertension   ESRD on dialysis [facility])   Seizure, late effect of stroke (HCC)   Hyperkalemia   Discharge Medications: Allergies as of 07/04/2024       Reactions   Cetirizine & Related Swelling   Hibiclens  [chlorhexidine  Gluconate] Itching        Medication List     PAUSE taking these medications    losartan  50 MG tablet Wait to take this until your doctor or other care provider tells you to start again. Commonly known as: COZAAR  Take 50 mg by mouth daily.   metoprolol  succinate 25 MG 24 hr tablet Wait to take this until your doctor or other care provider tells you to start again. Commonly known as: TOPROL -XL Take 1 tablet (25 mg total) by mouth daily.       TAKE these medications    acetaminophen  325 MG tablet Commonly known as: TYLENOL  Take 2 tablets (650 mg total) by mouth every 6 (six) hours as needed for mild pain.   apixaban  5 MG Tabs tablet Commonly known as: Eliquis  Take 1 tablet (5 mg total) by mouth 2 (two) times daily.   aspirin  81 MG chewable tablet Chew 1 tablet (81 mg total) by mouth daily.   atorvastatin  40 MG tablet Commonly known as: LIPITOR Take 1 tablet (40 mg total) by mouth daily. What changed: when to take this   Auryxia  1 GM 210 MG(Fe) tablet Generic drug: ferric citrate  Take 840 mg by mouth with breakfast, with lunch, and with evening meal.   cyclobenzaprine  10 MG tablet Commonly known as: FLEXERIL  Take 1 tablet (10 mg total) by mouth 2 (two) times daily as needed for muscle spasms. What changed: when to take this   divalproex  500 MG DR tablet Commonly known as: DEPAKOTE  Take 2 tablets (1,000 mg total)  by mouth every 12 (twelve) hours.   ferrous sulfate 325 (65 FE) MG EC tablet Take 325 mg by mouth daily.   imiquimod  5 % cream Commonly known as: Aldara  Apply to affected area three times weekly for 8 weeks.   levETIRAcetam  500 MG tablet Commonly known as: Keppra  Take 1 tablet (500 mg total) by mouth 2 (two) times daily.   metoprolol  tartrate 12.5 mg Tabs tablet Commonly known as: LOPRESSOR  Take 12.5 mg by mouth 2 (two) times daily.   midodrine  2.5 MG tablet Commonly known as: PROAMATINE  Take 2.5 mg by mouth in the morning and at bedtime.   multivitamin with minerals tablet Take 1 tablet by mouth daily.   nitroGLYCERIN  0.4 MG SL tablet Commonly known as: NITROSTAT  Place 0.4 mg under the tongue every 5 (five) minutes as needed for chest pain.               Discharge Care Instructions  (From admission, onward)           Start     Ordered   07/04/24 0000  Change dressing (specify)       Comments: Dressing change: daily using gauze, ABD if necessary and paper tape or other adhesive   07/04/24 1303            Disposition and follow-up:   Mr.[name] [name] was discharged  from [facility] in Good condition.  At the hospital follow up visit please address:  1.  Right internal jugular catheter removal site: Patient had internal jugular catheter removed and had acute blood loss from removal site. IR stopped the bleed with direct pressure and placed Figure-8 sutures in place with no further active bleeding from the site. He may resume Eliquis  upon discharge.  - Please change dressing daily as needed.  - Please remove sutures in 10-15 days.  - Please recheck CBC in 3-5 days to ensure Hgb is stable; discharge Hgb of 11.2.   No changes to his medications during this hospitalization.   2.  Labs / imaging needed at time of follow-up: CBC, RFP   3.  Pending labs/ test needing follow-up: None  Follow-up Appointments:  Follow-up Information      Theotis Haze ORN, NP Follow up.   Specialty: Nurse Practitioner Why: In 10-14 days for suture removal if unable to be removed at [facility] Contact information: [address] [place] [address] [place] [address] [phone]                  Hospital Course by problem list: [name] [name] is a 44 y.o. male with pertinent PMH of ESRD on HD MWF, antiphospholipid syndrome on Eliquis , prior CVA with resultant seizures, and lupus who presented with bleeding from recently removed right IJ tunneled HD catheter site and was admitted for acute blood loss anemia, now stable and being discharged on hospital day 0 with the following pertinent hospital course:  Acute blood loss anemia Hemoglobin dropped from 10 --> 5.6 after significant bleeding from right IJ tunneled HD cath site following catheter removal on 06/23/2024 with IR.  Bleeding is controlled after pressure and a stitch was applied by ED physician.  CT of the neck and chest with contrast shows hematoma in the associated soft tissues without active extravasation and patent bilateral internal jugular veins.  ED physician initiated transfusion of 2 units packed red blood cells.  Patient remained hemodynamically stable without tachycardia.  IR was consulted who evaluated the patient and agree with current plan without further IR intervention. Posttransfusion H&H of 12.0 with stable AM CBC on day of discharge of 11.2. He was monitored with serial exams and telemetry. Catheter removal site without further bleeding. Dressing was changed prior to discharge. Recommend daily dressing changes. Remove sutures in 10-14 days from discharge.  - Please change dressing daily as needed.  - Please remove sutures in 10-15 days.  - Please recheck CBC in 3-5 days to ensure Hgb is stable; discharge Hgb of 11.2.    ESRD on HD MWF Hyperkalemia Receiving HD through right upper extremity AV fistula and last HD session 2 days ago without issue.  He is mildly hyperkalemic at  5.4 without EKG changes.  No other indications for urgent/emergent HD.  Discussed with nephrology who attempted to have HD on 1/16, but due to high census had to complete HD on Saturday 1/17. He was disturbed by not having HD at his outpatient center and stopped dialysis 80 minutes before treatment was scheduled to finish.  He did receive Lokelma  on 1/16.  - Continue Auryxia  3 times daily - Resume HD MWF schedule  - Recheck RFP    Antiphospholipid syndrome on Eliquis  Prior CVA with seizure disorder Previously followed by hematology but no recent visits.  Multiple prior ischemic CVAs with persistent seizure disorder as sequela.  He has been on warfarin in the past with  adherence issues and was then switched to Eliquis .  Previous plans have been to have him follow-up with hematology outpatient again but these have not been successful.  I do not see any missed hematology appointments in our system.  Seizure medicines include divalproex  which is prescribed 1000 mg twice daily but he reports taking this medication 3 times daily however he could not tell me if it was 500 or 1000 mg 3 times daily that he was taking.  Pharmacy fill history is slightly inconsistent but his last fill was a 30-day supply from 06/06/2024 and he does not report running out so if he is taking it 3 times a day he is likely taking 500 mg 3 times a day.  This was his previous dose until it was changed by inpatient neurology in May 2025 to 1000 mg twice daily.  He is also on Keppra  500 mg twice daily but again says that he takes this 3 times a day but possibly only sometimes like when he has seizures associated with blood pressure changes in dialysis.  Again pharmacy fill history is inconsistent but it does not appear that he is running out of this medication consistently early. He was continued on his home AED schedule. His anticoagulation was held during admission given Hgb drop. Though given x2 stable Hgb labs, he may resume anticoagulation  on discharge.  - Divalproex  1000 mg twice daily and Keppra  500 mg twice daily - Continue eliquis     Hypertension He was initially hypertensive but normotensive after significant blood loss and initiation of blood transfusions.  Chart review shows that he has been previously been on losartan  and metoprolol  succinate but losartan  was either held or stopped and Toprol  succinate was changed to metoprolol  tartrate and the dose was effectively decreased from 25 mg of the long-acting daily to 25 mg of short acting daily.  Recommend BP monitoring outpatient and resume his home BP medications as clinically able.    Thrombocytopenia Chronic thrombocytopenia with recent levels anywhere from 60-230 but primarily around 100-130.  CBC on arrival showed a platelet count of 103.  No signs of active bleeding following addressed HD cath site.  No mucosal bleeding noted on history or exam. He was monitored with CBC.  - repeat CBC outpatient    Subjective Patient unhappy that he is not receiving HD at his outpatient HD center. He stopped HD 80 minutes before session was completed. He feels ready for discharge home to [facility].   Discharge Exam:   BP (!) 139/92   Pulse 87   Temp (!) 97.5 F (36.4 C)   Resp 15   Ht 6' (1.829 m)   Wt 117 kg   SpO2 95%   BMI 34.98 kg/m   Physical Exam:   Constitutional: well-appearing male lying in hospital bed, in no acute distress.  HEENT: normocephalic atraumatic, mucous membranes moist Cardiovascular: regular rate and rhythm, bilateral radial pulses 2+ Pulmonary/Chest: normal work of breathing on room air, lungs clear to auscultation bilaterally Abdominal: soft, non-tender, non-distended MSK: normal bulk and tone. Neurological: alert & oriented x 3 Skin: warm and dry; dressing overlying right internal jugular catheter removal site without drainage on dressing  Psych: mood calm, behavior normal, thought content normal, judgement normal    Pertinent Labs,  Studies, and Procedures:     Latest Ref Rng & Units 07/04/2024    5:46 AM 07/03/2024    7:34 PM 07/03/2024    7:00 AM  CBC  WBC 4.0 -  10.5 K/uL 5.9     Hemoglobin 13.0 - 17.0 g/dL 88.7  87.9  5.6   Hematocrit 39.0 - 52.0 % 32.3  34.8  18.0   Platelets 150 - 400 K/uL 85          Latest Ref Rng & Units 07/04/2024    5:46 AM 07/03/2024    3:19 AM 06/05/2024    1:34 PM  CMP  Glucose 70 - 99 mg/dL 75  78  76   BUN 6 - 20 mg/dL 79  62  34   Creatinine 0.61 - 1.24 mg/dL 83.79  86.49  1.19   Sodium 135 - 145 mmol/L 139  138  137   Potassium 3.5 - 5.1 mmol/L 5.9  5.4  4.3   Chloride 98 - 111 mmol/L 97  97  96   CO2 22 - 32 mmol/L 26  26    Calcium  8.9 - 10.3 mg/dL 8.8  9.1      CT Chest W Contrast Result Date: 07/03/2024 EXAM: CT CHEST WITH CONTRAST 07/03/2024 05:19:45 AM TECHNIQUE: CT of the chest was performed with the administration of 75 mL of iohexol  (OMNIPAQUE ) 350 MG/ML injection. Multiplanar reformatted images are provided for review. Automated exposure control, iterative reconstruction, and/or weight based adjustment of the mA/kV was utilized to reduce the radiation dose to as low as reasonably achievable. COMPARISON: Portable chest from today, Portable chest 06/05/2024 and 03/07/2024, CTA chest 01/07/2024, chest CT no contrast 11/08/2022. CLINICAL HISTORY: tunneled IJ removed and bleeding FINDINGS: MEDIASTINUM: Heart: There is mild cardiomegaly. Calcifications are present in the lower and medial mitral ring. Scattered calcific plaque of the LAD and right coronary artery. There is no pericardial effusion. Pulmonary Trunk: The pulmonary trunk is prominent but unchanged at 3.3 cm. No central embolus is seen. Aorta/Great Vessels: There is mild atherosclerosis in the aorta and great vessels without an aneurysm, dissection, or stenosis. Pulmonary Veins: The pulmonary veins are nondilated. Central Airways: The central airways are clear. LYMPH NODES: Significant improvement is seen in prior  mediastinal adenopathy. Examples include an AP window lymph node measuring 6 mm short axis on axial 46 of series 3, previously was 1.5 cm, and a right paratracheal lymph node measuring 1.2 cm was previously 2.4 cm. Further scattered lymph nodes up to 1 cm in short axis in the hilar and subcarinal regions are also significantly reduced in size. Axillary spaces are clear. LUNGS AND PLEURA: Lungs: There is mild bronchial thickening in the lower lobes. There is a low inspiration with chronic appearance of mosaic attenuation in the lower lobes, which could be due to air trapping or low inspiration. No focal pneumonia is seen. No pulmonary nodule or mass. There is a calcified granuloma in the right lower lobe. No suspicious nodules. Pleura: No pleural effusion or pneumothorax. SOFT TISSUES/BONES: Soft Tissues: Previously there was a tunneled right IJ approach dialysis catheter in place which has been removed. Subcutaneously at the prior catheter entry there is a heterogeneously dense subcutaneous collection measuring 4.4 x 2.9 x 2.5 cm, which is consistent with a hematoma. There is adjacent stranding in the subcutaneous soft tissues consistent with reactive edema or cellulitis. No subpectoral hematoma or mediastinal hematoma is seen. Bones: The osseous structures are dense consistent with renal osteodystrophy, with no acute or other significant osseous findings. There is moderate arthrosis at the shoulders. UPPER ABDOMEN: Kidneys: The kidneys are chronically atrophic. Spleen: There is scarring in the spleen. Other: No acute upper abdominal findings. IMPRESSION: 1. Hematoma at the  prior tunneled right IJ approach dialysis catheter entry site with adjacent reactive edema or cellulitis; no subpectoral or mediastinal hematoma. 2. Significant improvement in prior mediastinal adenopathy. Electronically signed by: Francis Quam MD 07/03/2024 05:59 AM EST RP Workstation: [id number]   CT Soft Tissue Neck W Contrast Result Date:  07/03/2024 EXAM: CT NECK WITH CONTRAST 07/03/2024 05:19:45 AM TECHNIQUE: CT of the neck was performed with the administration of 75 mL of iohexol  (OMNIPAQUE ) 350 MG/ML injection. Multiplanar reformatted images are provided for review. Automated exposure control, iterative reconstruction, and/or weight based adjustment of the mA/kV was utilized to reduce the radiation dose to as low as reasonably achievable. COMPARISON: None available. CLINICAL HISTORY: tunneled IJ bleeding FINDINGS: AERODIGESTIVE TRACT: No discrete mass. No edema. SALIVARY GLANDS: The parotid and submandibular glands are unremarkable. THYROID: Unremarkable. LYMPH NODES: No suspicious cervical lymphadenopathy. SOFT TISSUES: There is an ovoid soft tissue density in the subcutaneous fat of the right upper anterior chest wall overlying the clavicle, measuring approximately 2.9 x 2.2 x 3.8 cm, compatible with a hematoma. The internal jugular catheter described in the clinical indication has been removed. The internal jugular veins are patent bilaterally. There is no evidence of active extravasation. BONES: No abnormality. OTHER: Visualized sinuses and mastoid air cells are well aerated. There is mucosal disease within the right maxillary sinus. Visualized lungs are clear. IMPRESSION: 1. Hematoma in the subcutaneous fat of the right upper anterior chest wall overlying the clavicle, measuring approximately 2.9 x 2.2 x 3.8 cm. No evidence of active extravasation. Right internal jugular catheter has been removed; internal jugular veins are patent bilaterally. 2. Mucosal disease within the right maxillary sinus. Electronically signed by: Evalene Coho MD 07/03/2024 05:57 AM EST RP Workstation: [id number]   DG Chest Portable 1 View Result Date: 07/03/2024 EXAM: 1 VIEW(S) XRAY OF THE CHEST 07/03/2024 03:00:00 AM COMPARISON: Portable chest 06/05/2024. CLINICAL HISTORY: catheter placement catheter placement FINDINGS: LINES, TUBES AND DEVICES: Multiple  overlying telemetry leads. No intravascular catheters are identified. LUNGS AND PLEURA: Lungs are expiratory. The bases are obscured by expiration. The aerated lungs appear clear. No focal pulmonary opacity. No pleural effusion. No pneumothorax. HEART AND MEDIASTINUM: There is stable cardiomegaly. Mild central vascular prominence without appreciable edema. Mediastinum is normally outlined. BONES AND SOFT TISSUES: Mild thoracic spondylosis. No acute osseous abnormality. There is an artifact overlying the outer right rib cage . IMPRESSION: 1. No acute findings. 2.  Expiratory study. Limited view of the bases. 3. Stable cardiomegaly. 4. No intravascular catheters are seen. Electronically signed by: Francis Quam MD 07/03/2024 03:12 AM EST RP Workstation: [id number]     Discharge Instructions: Discharge Instructions     Change dressing (specify)   Complete by: As directed    Dressing change: daily using gauze, ABD if necessary and paper tape or other adhesive   Increase activity slowly   Complete by: As directed          Discharge Instructions      To Mr. Clayborne LITTIE Ross or their caretakers,  They were admitted to [facility] on 07/03/2024 for evaluation and treatment of: bleeding from catheter removal site      The evaluation suggested continued bleeding after dialysis catheter removal. Your blood level was found to be low, but after you received a blood transfusion, your They were treated with sutures, dressing changes, blood transfusion, and close monitoring. Your bleeding was   They were discharged from the hospital on 07/04/24. I recommend the following after leaving the hospital:  Your medications were not adjusted during this admission. Please continue to take them as prescribed.   Please make sure you follow up at your outpatient dialysis center for your regularly scheduled dialysis sessions. We would like for them to take a look at your recent catheter removal site,  change the dressing as needed at sessions.  Your sutures will need to be removed within 10-14 days from discharge.    Follow-up Information     Theotis Haze ORN, NP Follow up.   Specialty: Nurse Practitioner Why: In 10-14 days for suture removal if unable to be removed at [facility] Contact information: [address] [place] [address] [place] [address] [phone]                   For questions about your care plan, until you are able to see your primary doctor: Call [phone]. Dial  0 for the operator. Ask for the internal medicine resident on call.  Thank you for allowing us  to be part of your care.   Doyal Miyamoto, MD 07/04/2024, 12:57 PM       Signed: Doyal Miyamoto, MD 07/04/2024, 1:03 PM    "

## 2024-07-04 NOTE — Progress Notes (Signed)
 Report called to Andree at [facility]. All questions answered to her satisfaction. No further concerns at this time.

## 2024-07-05 ENCOUNTER — Emergency Department (HOSPITAL_COMMUNITY)

## 2024-07-05 ENCOUNTER — Emergency Department (HOSPITAL_COMMUNITY)
Admission: EM | Admit: 2024-07-05 | Discharge: 2024-07-05 | Disposition: A | Discharge status: Home / Self Care | Attending: Emergency Medicine | Admitting: Emergency Medicine

## 2024-07-05 DIAGNOSIS — W19XXXA Unspecified fall, initial encounter: Secondary | ICD-10-CM

## 2024-07-05 DIAGNOSIS — Z7982 Long term (current) use of aspirin: Secondary | ICD-10-CM | POA: Diagnosis not present

## 2024-07-05 DIAGNOSIS — W010XXA Fall on same level from slipping, tripping and stumbling without subsequent striking against object, initial encounter: Secondary | ICD-10-CM | POA: Insufficient documentation

## 2024-07-05 DIAGNOSIS — Z992 Dependence on renal dialysis: Secondary | ICD-10-CM | POA: Insufficient documentation

## 2024-07-05 DIAGNOSIS — Z7901 Long term (current) use of anticoagulants: Secondary | ICD-10-CM | POA: Insufficient documentation

## 2024-07-05 DIAGNOSIS — I120 Hypertensive chronic kidney disease with stage 5 chronic kidney disease or end stage renal disease: Secondary | ICD-10-CM | POA: Insufficient documentation

## 2024-07-05 DIAGNOSIS — Z8673 Personal history of transient ischemic attack (TIA), and cerebral infarction without residual deficits: Secondary | ICD-10-CM | POA: Diagnosis not present

## 2024-07-05 DIAGNOSIS — R410 Disorientation, unspecified: Secondary | ICD-10-CM | POA: Diagnosis present

## 2024-07-05 DIAGNOSIS — N186 End stage renal disease: Secondary | ICD-10-CM | POA: Insufficient documentation

## 2024-07-05 DIAGNOSIS — Z79899 Other long term (current) drug therapy: Secondary | ICD-10-CM | POA: Diagnosis not present

## 2024-07-05 LAB — CBG MONITORING, ED: Glucose-Capillary: 102 mg/dL — ABNORMAL HIGH (ref 70–99)

## 2024-07-05 LAB — POC OCCULT BLOOD, ED: Fecal Occult Bld: NEGATIVE

## 2024-07-05 MED ORDER — LORAZEPAM 1 MG PO TABS: 1.0000 mg | ORAL_TABLET | Freq: Once | ORAL | Status: DC

## 2024-07-05 NOTE — Discharge Planning (Signed)
 [facility] Patient Discharge Orders- [facility] CLINIC: [facility]  Patient's name: [name] [name] Admit/DC Dates: 07/03/2024 - 07/04/2024  Discharge Diagnoses: Acute blood loss anemia s/p TDC removal     Aranesp : Given: no   Date and amount of last dose: N/A  Last Hgb: 11.2 PRBC's Given: yes Date/# of units: 2 units PRBC on 07/03/24 ESA dose for discharge: mircera 75 mcg IV q 2 weeks  IV Iron  dose at discharge: none  Heparin  change: no- no heparin   EDW Change: no New EDW:   [place] Change: no  Access intervention/Change: Yes Details: TDC removed, see above  Hectorol /Calcitriol  change: no  Discharge Labs: Calcium  8.8 Phosphorus -- Albumin  -- K+ 5.9  IV Antibiotics: no Details:  On Coumadin ?: no Last INR: Next INR: Managed By:   OTHER/APPTS/LAB ORDERS:    D/C Meds to be reconciled by nurse after every discharge.  Completed By: Lucie Collet, PA-C 07/05/2024, 12:54 PM  [facility] Pager: [phone]    Reviewed by: MD:______ RN_______

## 2024-07-05 NOTE — ED Notes (Signed)
 Back to room.

## 2024-07-05 NOTE — ED Notes (Signed)
 Pt became irritable with rn and would not allow an IV to be placed at this time.

## 2024-07-05 NOTE — ED Notes (Signed)
 Patient resting with eyes closed. Patient ABC's intact. Chest rise and fall noted. Call bell within reach. Bed in lowest position and locked. Patient denies further needs at this time. Support at bedside.

## 2024-07-05 NOTE — ED Provider Notes (Signed)
 " [facility] Provider Note   CSN: [id number] Arrival date & time: 07/05/24  1319     Patient presents with: [name] [name] Alvan is a 44 y.o. male with a history of seizures, stroke, hypertension, end-stage renal disease, and recent right internal jugular catheter removal  who presents, brought in by EMS, from [facility] with an unwitnessed fall. Patient was recently discharged from the hospital yesterday after a stay for [facility] complication/significant blood loss.  Patient was attempting to go to the restroom and where he fell, and staff noted that he was covered in black stool. Patient states that he is unsure on if he hit is head and does not recall having episodes of diarrhea, nausea, or vomiting. Patient is on Eliquis .    HPI was difficulty to obtain as patient is baseline confused.  Patient at this time states that he feels fine and does not know what is going on  Patient is in no acute distress.   Fall       Prior to Admission medications  Medication Sig Start Date End Date Taking? Authorizing Provider  acetaminophen  (TYLENOL ) 325 MG tablet Take 2 tablets (650 mg total) by mouth every 6 (six) hours as needed for mild pain. 02/13/23   Danton Reyes DASEN, MD  apixaban  (ELIQUIS ) 5 MG TABS tablet Take 1 tablet (5 mg total) by mouth 2 (two) times daily. 11/12/23   Gonfa, Taye T, MD  aspirin  81 MG chewable tablet Chew 1 tablet (81 mg total) by mouth daily. 01/13/24   Shitarev, Dimitry, MD  atorvastatin  (LIPITOR) 40 MG tablet Take 1 tablet (40 mg total) by mouth daily. Patient taking differently: Take 40 mg by mouth daily in the afternoon. 08/03/23   Darci Pore, MD  AURYXIA  1 GM 210 MG(Fe) tablet Take 840 mg by mouth with breakfast, with lunch, and with evening meal. 10/07/23   [provider]  cyclobenzaprine  (FLEXERIL ) 10 MG tablet Take 1 tablet (10 mg total) by mouth 2 (two) times daily as needed for muscle  spasms. Patient taking differently: Take 10 mg by mouth in the morning and at bedtime. 03/01/24   Elnor Jayson LABOR, DO  divalproex  (DEPAKOTE ) 500 MG DR tablet Take 2 tablets (1,000 mg total) by mouth every 12 (twelve) hours. 11/12/23   Gonfa, Taye T, MD  ferrous sulfate 325 (65 FE) MG EC tablet Take 325 mg by mouth daily.    [provider]  imiquimod  (ALDARA ) 5 % cream Apply to affected area three times weekly for 8 weeks. Patient not taking: Reported on 07/03/2024 01/13/24 01/12/25  Shitarev, Dimitry, MD  levETIRAcetam  (KEPPRA ) 500 MG tablet Take 1 tablet (500 mg total) by mouth 2 (two) times daily. 01/12/24   Alba Sharper, MD  [Paused] losartan  (COZAAR ) 50 MG tablet Take 50 mg by mouth daily. Patient not taking: Reported on 07/03/2024 Wait to take this until your doctor or other care provider tells you to start again.    [provider]  [Paused] metoprolol  succinate (TOPROL -XL) 25 MG 24 hr tablet Take 1 tablet (25 mg total) by mouth daily. Patient not taking: Reported on 07/03/2024 Wait to take this until your doctor or other care provider tells you to start again. 01/13/24   Shitarev, Dimitry, MD  metoprolol  tartrate (LOPRESSOR ) 12.5 mg TABS tablet Take 12.5 mg by mouth 2 (two) times daily.    [provider]  midodrine  (PROAMATINE ) 2.5 MG tablet Take 2.5 mg by mouth  in the morning and at bedtime.    [provider]  Multiple Vitamins-Minerals (MULTIVITAMIN WITH MINERALS) tablet Take 1 tablet by mouth daily.    [provider]  nitroGLYCERIN  (NITROSTAT ) 0.4 MG SL tablet Place 0.4 mg under the tongue every 5 (five) minutes as needed for chest pain.    [provider]    Allergies: Cetirizine & related and Hibiclens  [chlorhexidine  gluconate]    Review of Systems  Gastrointestinal:  Positive for blood in stool.    Updated Vital Signs BP (!) 182/93   Pulse 80   Temp 97.8 F (36.6 C) (Oral)   Resp 12   SpO2 100%   Physical Exam Vitals  and nursing note reviewed. Exam conducted with a chaperone present (EMT at bedside).  Constitutional:      General: He is awake. He is not in acute distress.    Appearance: Normal appearance. He is not toxic-appearing.  HENT:     Head: Normocephalic and atraumatic.     Right Ear: Hearing normal.     Left Ear: Hearing normal.     Nose: Nose normal.  Eyes:     Extraocular Movements: Extraocular movements intact.     Conjunctiva/sclera: Conjunctivae normal.     Pupils: Pupils are equal, round, and reactive to light.  Cardiovascular:     Rate and Rhythm: Normal rate and regular rhythm.     Pulses: Normal pulses.  Pulmonary:     Effort: Pulmonary effort is normal. No respiratory distress.     Comments: Patient has no difficulty speaking in complete sentences. Chest:     Comments: IJC removal site appears clean and well kept - no signs of infection.  Abdominal:     General: Abdomen is flat. Bowel sounds are normal.     Palpations: Abdomen is soft.     Tenderness: There is no abdominal tenderness.     Comments: No abdominal tenderness. No CVA tenderness, guarding, or rebound. No hernia appreciated on exam. There is no ecchymosis or additional skin changes to the abdomen.   Genitourinary:    Rectum: Normal. Guaiac result negative. No tenderness or external hemorrhoid.     Comments: No hematochezia/melena visualized on exam  Musculoskeletal:        General: Normal range of motion.     Cervical back: Normal range of motion.  Skin:    General: Skin is warm and dry.     Capillary Refill: Capillary refill takes less than 2 seconds.  Neurological:     General: No focal deficit present.     Mental Status: He is alert. Mental status is at baseline.  Psychiatric:        Mood and Affect: Mood normal.     (all labs ordered are listed, but only abnormal results are displayed) Labs Reviewed  CBG MONITORING, ED - Abnormal; Notable for the following components:      Result Value    Glucose-Capillary 102 (*)    All other components within normal limits  POC OCCULT BLOOD, ED    EKG: EKG Interpretation Date/Time:  Sunday July 05 2024 13:54:06 EST Ventricular Rate:  86 PR Interval:  146 QRS Duration:  115 QT Interval:  396 QTC Calculation: 474 R Axis:   119  Text Interpretation: Sinus rhythm Incomplete right bundle Quincy block Inferolateral infarct, age indeterminate Abnormal ekg T wave inversion Confirmed by Midge Golas ([id number]) on 07/06/2024 2:32:33 PM  Radiology: No results found.   Procedures   Medications Ordered in the  ED - No data to display                                  Medical Decision Making  Patient presents to the ED for: fall, blood in stool This involves an extensive number of treatment options Differential diagnosis includes:  Gastrointestinal bleeding Traumatic etiology -fall, anticoagulated Other GI etiology Co-morbid conditions: Stroke, seizure, hypertension, end-stage renal disease  Additional history/records obtained and reviewed: Additional history obtained from  outside medical records External records from outside source obtained and reviewed including several ED and vascular surgery records.   Data Reviewed / Actions Taken: Labs ordered - pending at the time of handoff Imaging ordered - pending at the time of handoff EKG ordered - normal sinus rhythm  ED Course / Reassessments: Problem List:  44 year old male presented for evaluation after a fall. Initial assessment included history, physical exam, and review of prior medical records. Laboratory studies and imaging were ordered and pending at the time of handoff.  Patient did have a negative Hemoccult, reassuring that dark stools likely not gastrointestinal bleeding. Vital signs were obtained and monitored, and the patient remained stable throughout the stay. At the time of handoff patient was refusing laboratory studies and imaging -I attempted to speak with the  patient who agreed to labs and imaging after shared decision making. At the time of handoff, I suspect that patient will be clear for discharge after laboratory studies and head CT given patient reassuring clinical appearence - to be determined by [facility] PA-C as studies result.   Social determinants impacting care: cognitive impairment   Disposition: Disposition: Discharge pending - handoff to provider Henderly PA-C for further evaluation of discharge vs admission after laboratory and imaging studies result.   This note was produced using Electronics Engineer. While I have reviewed and verified all clinical information, transcription errors may remain.      Final diagnoses:  Fall, initial encounter    ED Discharge Orders     None          Willma Duwaine CROME, [place] 07/12/24 1411  "

## 2024-07-05 NOTE — ED Notes (Signed)
 Patient here for a fall at facility. Patient reports tripping over something that caused him to fall. Patient laying in ED stretcher in and out of sleep. Patient with no obvious signs of injury at time of assessment. Patient with two fistulas bilaterally, +thrill on right arm. +pulsation on left arm. Patient reports active dialysis on right arm only. Patient is A&Ox4. Speech is clear and coherent. No signs of distress at this time. Patient in gown, placed on cardiac monitoring. Call light within reach, bed locked and at the lowest position.

## 2024-07-05 NOTE — ED Notes (Signed)
 Patient transported to CT

## 2024-07-05 NOTE — ED Provider Notes (Signed)
 Dc yesterday internal jugular removed had bleeding. Dc to [facility]. Unwhit fall in bathroom at facility. On anticoagulation for lupus anticoagulant.  Plan on FU on Head CT Physical Exam  BP (!) 182/93   Pulse 80   Temp 97.8 F (36.6 C) (Oral)   Resp 12   SpO2 100%   Physical Exam Vitals and nursing note reviewed.  Constitutional:      General: He is not in acute distress.    Appearance: He is well-developed. He is not ill-appearing, toxic-appearing or diaphoretic.  HENT:     Head: Normocephalic and atraumatic.     Nose: Nose normal.     Mouth/Throat:     Mouth: Mucous membranes are moist.  Eyes:     Pupils: Pupils are equal, round, and reactive to light.  Cardiovascular:     Rate and Rhythm: Normal rate and regular rhythm.     Pulses: Normal pulses.     Heart sounds: Normal heart sounds.  Pulmonary:     Effort: Pulmonary effort is normal. No respiratory distress.     Breath sounds: Normal breath sounds.  Abdominal:     General: Bowel sounds are normal. There is no distension.     Palpations: Abdomen is soft.  Musculoskeletal:        General: Normal range of motion.     Cervical back: Normal range of motion and neck supple.  Skin:    General: Skin is warm and dry.     Capillary Refill: Capillary refill takes less than 2 seconds.     Comments: Dialysis graft to upper extremity with thrill  Neurological:     General: No focal deficit present.     Mental Status: He is alert.     Cranial Nerves: No cranial nerve deficit.     Sensory: No sensory deficit.     Motor: No weakness.     Comments: Alert to person, place, year     Procedures  Procedures Labs Reviewed  CBG MONITORING, ED - Abnormal; Notable for the following components:      Result Value   Glucose-Capillary 102 (*)    All other components within normal limits  POC OCCULT BLOOD, ED   CT Head Wo Contrast Result Date: 07/05/2024 EXAM: CT HEAD WITHOUT CONTRAST 07/05/2024 04:12:29 PM TECHNIQUE: CT of the  head was performed without the administration of intravenous contrast. Automated exposure control, iterative reconstruction, and/or weight based adjustment of the mA/kV was utilized to reduce the radiation dose to as low as reasonably achievable. COMPARISON: 10/20/2023 CLINICAL HISTORY: fall fall fall fall fall FINDINGS: BRAIN AND VENTRICLES: No acute hemorrhage. No evidence of acute infarct. Remote left frontal and parietal infarcts. Remote right occipital infarct. White matter hypoattenuation, likely sequela of chronic microvascular ischemic disease. Cerebral volume loss. Ex vacuo ventricular dilation. These findings are all stable. No extra-axial collection. No mass effect or midline shift. Atherosclerotic calcifications. ORBITS: No acute abnormality. SINUSES: No acute abnormality. SOFT TISSUES AND SKULL: No acute soft tissue abnormality. No skull fracture. IMPRESSION: 1. No acute intracranial abnormality related to the fall. 2. Stable remote left frontal and parietal and right occipital infarcts. Electronically signed by: Lonni Necessary MD 07/05/2024 04:31 PM EST RP Workstation: [id number]   DG Chest Portable 1 View Result Date: 07/05/2024 EXAM: 1 VIEW(S) XRAY OF THE CHEST 07/05/2024 02:15:00 PM COMPARISON: 07/03/2024 CLINICAL HISTORY: sob FINDINGS: LINES, TUBES AND DEVICES: Left axillary vascular stent. LUNGS AND PLEURA: Low lung volumes. Increased patchy lower lung airspace opacities. Mild interstitial  prominence. No pleural effusion. No pneumothorax. HEART AND MEDIASTINUM: Cardiomegaly. BONES AND SOFT TISSUES: Degenerative changes of shoulders. IMPRESSION: 1. Increased patchy lower lung airspace opacities and mild interstitial prominence. Favor Worsening edema 2. Cardiomegaly. Electronically signed by: Norleen Boxer MD 07/05/2024 03:20 PM EST RP Workstation: [id number]   CT Chest W Contrast Result Date: 07/03/2024 EXAM: CT CHEST WITH CONTRAST 07/03/2024 05:19:45 AM TECHNIQUE: CT of the chest was  performed with the administration of 75 mL of iohexol  (OMNIPAQUE ) 350 MG/ML injection. Multiplanar reformatted images are provided for review. Automated exposure control, iterative reconstruction, and/or weight based adjustment of the mA/kV was utilized to reduce the radiation dose to as low as reasonably achievable. COMPARISON: Portable chest from today, Portable chest 06/05/2024 and 03/07/2024, CTA chest 01/07/2024, chest CT no contrast 11/08/2022. CLINICAL HISTORY: tunneled IJ removed and bleeding FINDINGS: MEDIASTINUM: Heart: There is mild cardiomegaly. Calcifications are present in the lower and medial mitral ring. Scattered calcific plaque of the LAD and right coronary artery. There is no pericardial effusion. Pulmonary Trunk: The pulmonary trunk is prominent but unchanged at 3.3 cm. No central embolus is seen. Aorta/Great Vessels: There is mild atherosclerosis in the aorta and great vessels without an aneurysm, dissection, or stenosis. Pulmonary Veins: The pulmonary veins are nondilated. Central Airways: The central airways are clear. LYMPH NODES: Significant improvement is seen in prior mediastinal adenopathy. Examples include an AP window lymph node measuring 6 mm short axis on axial 46 of series 3, previously was 1.5 cm, and a right paratracheal lymph node measuring 1.2 cm was previously 2.4 cm. Further scattered lymph nodes up to 1 cm in short axis in the hilar and subcarinal regions are also significantly reduced in size. Axillary spaces are clear. LUNGS AND PLEURA: Lungs: There is mild bronchial thickening in the lower lobes. There is a low inspiration with chronic appearance of mosaic attenuation in the lower lobes, which could be due to air trapping or low inspiration. No focal pneumonia is seen. No pulmonary nodule or mass. There is a calcified granuloma in the right lower lobe. No suspicious nodules. Pleura: No pleural effusion or pneumothorax. SOFT TISSUES/BONES: Soft Tissues: Previously there was  a tunneled right IJ approach dialysis catheter in place which has been removed. Subcutaneously at the prior catheter entry there is a heterogeneously dense subcutaneous collection measuring 4.4 x 2.9 x 2.5 cm, which is consistent with a hematoma. There is adjacent stranding in the subcutaneous soft tissues consistent with reactive edema or cellulitis. No subpectoral hematoma or mediastinal hematoma is seen. Bones: The osseous structures are dense consistent with renal osteodystrophy, with no acute or other significant osseous findings. There is moderate arthrosis at the shoulders. UPPER ABDOMEN: Kidneys: The kidneys are chronically atrophic. Spleen: There is scarring in the spleen. Other: No acute upper abdominal findings. IMPRESSION: 1. Hematoma at the prior tunneled right IJ approach dialysis catheter entry site with adjacent reactive edema or cellulitis; no subpectoral or mediastinal hematoma. 2. Significant improvement in prior mediastinal adenopathy. Electronically signed by: Francis Quam MD 07/03/2024 05:59 AM EST RP Workstation: [id number]   CT Soft Tissue Neck W Contrast Result Date: 07/03/2024 EXAM: CT NECK WITH CONTRAST 07/03/2024 05:19:45 AM TECHNIQUE: CT of the neck was performed with the administration of 75 mL of iohexol  (OMNIPAQUE ) 350 MG/ML injection. Multiplanar reformatted images are provided for review. Automated exposure control, iterative reconstruction, and/or weight based adjustment of the mA/kV was utilized to reduce the radiation dose to as low as reasonably achievable. COMPARISON: None available. CLINICAL HISTORY: tunneled IJ  bleeding FINDINGS: AERODIGESTIVE TRACT: No discrete mass. No edema. SALIVARY GLANDS: The parotid and submandibular glands are unremarkable. THYROID: Unremarkable. LYMPH NODES: No suspicious cervical lymphadenopathy. SOFT TISSUES: There is an ovoid soft tissue density in the subcutaneous fat of the right upper anterior chest wall overlying the clavicle, measuring  approximately 2.9 x 2.2 x 3.8 cm, compatible with a hematoma. The internal jugular catheter described in the clinical indication has been removed. The internal jugular veins are patent bilaterally. There is no evidence of active extravasation. BONES: No abnormality. OTHER: Visualized sinuses and mastoid air cells are well aerated. There is mucosal disease within the right maxillary sinus. Visualized lungs are clear. IMPRESSION: 1. Hematoma in the subcutaneous fat of the right upper anterior chest wall overlying the clavicle, measuring approximately 2.9 x 2.2 x 3.8 cm. No evidence of active extravasation. Right internal jugular catheter has been removed; internal jugular veins are patent bilaterally. 2. Mucosal disease within the right maxillary sinus. Electronically signed by: Evalene Coho MD 07/03/2024 05:57 AM EST RP Workstation: [id number]   DG Chest Portable 1 View Result Date: 07/03/2024 EXAM: 1 VIEW(S) XRAY OF THE CHEST 07/03/2024 03:00:00 AM COMPARISON: Portable chest 06/05/2024. CLINICAL HISTORY: catheter placement catheter placement FINDINGS: LINES, TUBES AND DEVICES: Multiple overlying telemetry leads. No intravascular catheters are identified. LUNGS AND PLEURA: Lungs are expiratory. The bases are obscured by expiration. The aerated lungs appear clear. No focal pulmonary opacity. No pleural effusion. No pneumothorax. HEART AND MEDIASTINUM: There is stable cardiomegaly. Mild central vascular prominence without appreciable edema. Mediastinum is normally outlined. BONES AND SOFT TISSUES: Mild thoracic spondylosis. No acute osseous abnormality. There is an artifact overlying the outer right rib cage . IMPRESSION: 1. No acute findings. 2.  Expiratory study. Limited view of the bases. 3. Stable cardiomegaly. 4. No intravascular catheters are seen. Electronically signed by: Francis Quam MD 07/03/2024 03:12 AM EST RP Workstation: [id number]   IR Removal Tun Cv Cath W/O FL Result Date:  06/23/2024 INDICATION: Patient with a history of end-stage renal disease on hemodialysis with a tunneled hemodialysis catheter placed in IR January 2024. Patient now has working fistula. Interventional Radiology asked to remove dialysis catheter. EXAM: REMOVAL TUNNELED CENTRAL VENOUS CATHETER MEDICATIONS: 1% lidocaine  10 mL ANESTHESIA/SEDATION: None FLUOROSCOPY: None COMPLICATIONS: None immediate. PROCEDURE: Informed written consent was obtained from the patient after a thorough discussion of the procedural risks, benefits and alternatives. All questions were addressed. Maximal Sterile Barrier Technique was utilized including caps, mask, sterile gowns, sterile gloves, sterile drape, hand hygiene and skin antiseptic. A timeout was performed prior to the initiation of the procedure. The patient's right chest and catheter was prepped and draped in a normal sterile fashion. Heparin  was removed from both ports of catheter. 1% lidocaine  was used for local anesthesia. Using gentle blunt dissection the cuff of the catheter was exposed, a fibrin sheath was gently cut down with a scalpel, and the catheter was removed in it's entirety. Pressure was held till hemostasis was obtained. A sterile dressing was applied. The patient tolerated the procedure well with no immediate complications. IMPRESSION: Successful catheter removal as described above. Procedure performed by: Warren Dais, NP and Dr. Vanice. Electronically Signed   By: CHRISTELLA.  Shick M.D.   On: 06/23/2024 13:51    ED Course / MDM    44 year old here for evaluation of unwitnessed fall.  Recently discharged yesterday to [facility] facility.  Patient states he got up to have a bowel movement and tripped and fell.  He denies hitting his head,  LOC.  He states he has no complaints.  He is refusing blood work.  He is okay with head CT.  Here he moves all 4 extremities.  Heart and lungs clear.  Abdomen soft, nontender.  No obvious traumatic injury.  Nursing at facility did  state that patient had an episode of dark stool however occult here negative.  Dialysis access is a positive thrill.  No seizure-like activity.  No postictal.  Per facility, EMS.  Patient able to answer questions appropriately.  Wants to discharge home.  Again refuses blood work. Ambulatory at baseline. Will have him FU outpatient.   Medical Decision Making Amount and/or Complexity of Data Reviewed External Data Reviewed: labs, radiology and notes. Labs: ordered. Decision-making details documented in ED Course. Radiology: ordered and independent interpretation performed. Decision-making details documented in ED Course.  Risk OTC drugs. Prescription drug management. Decision regarding hospitalization. Diagnosis or treatment significantly limited by social determinants of health.          Kariana Wiles A, PA-C 07/05/24 2157    Emil Share, DO 07/05/24 2224

## 2024-07-05 NOTE — ED Triage Notes (Signed)
 Pt bib ems from [facility] c/o unwitnessed fall. Pt was attempting to go to restroom. Pt had mechanical fall. Pt was covered in feces that was dark and tarry. Pt is on Eliquis . Baseline confused and denies pain.   BP 143/102 HR 92 O2 95 CBG 140  Hx HD (MWF)

## 2024-07-05 NOTE — ED Notes (Addendum)
 Attempted to get blood from patient. Patient stated, take the needle out or I will smack the shit out of you. Patient told that he will need to let RN hand go in order to take needle out safely. Patient let go of RN's hand willingly without issue. PA Henderly notified. Patient remained calm in ED stretcher. Patient ABC's intact. Chest rise and fall noted. Call bell within reach. Bed in lowest position and locked. Patient denies further needs at this time.

## 2024-07-05 NOTE — Discharge Instructions (Signed)
 Workup today was during reassuring  Make sure to follow-up outpatient, return for any worsening symptoms

## 2024-07-06 NOTE — Progress Notes (Signed)
 Late Note Entry- Jul 06, 2024  Pt was d/c on Saturday. Contacted [facility] this morning to be advised of pt's d/c date and that pt should resume care today.   Randine Mungo Dialysis Navigator [phone]

## 2024-07-07 LAB — HEPATITIS B SURFACE ANTIBODY, QUANTITATIVE: Hep B S AB Quant (Post): 5540 m[IU]/mL

## 2024-07-14 ENCOUNTER — Ambulatory Visit: Attending: Nurse Practitioner

## 2024-07-24 ENCOUNTER — Encounter (HOSPITAL_COMMUNITY): Payer: Self-pay

## 2024-07-24 ENCOUNTER — Telehealth: Payer: Self-pay | Admitting: Oncology

## 2024-07-24 NOTE — Telephone Encounter (Signed)
 I left voicemail for patient regarding scheduled MD and lab appointments on 08/07/2024.

## 2024-08-07 ENCOUNTER — Inpatient Hospital Stay: Admitting: Oncology

## 2024-08-07 ENCOUNTER — Inpatient Hospital Stay
